# Patient Record
Sex: Female | Born: 1955 | Race: Black or African American | Hispanic: No | Smoking: Former smoker
Health system: Southern US, Community
[De-identification: ages and names within clinical notes are randomized; demographics above are authoritative.]

## PROBLEM LIST (undated history)

## (undated) ENCOUNTER — Emergency Department (HOSPITAL_COMMUNITY): Admission: EM | Payer: Self-pay | Source: Home / Self Care

## (undated) DIAGNOSIS — K529 Noninfective gastroenteritis and colitis, unspecified: Secondary | ICD-10-CM

## (undated) DIAGNOSIS — M5030 Other cervical disc degeneration, unspecified cervical region: Secondary | ICD-10-CM

## (undated) DIAGNOSIS — G4730 Sleep apnea, unspecified: Secondary | ICD-10-CM

## (undated) DIAGNOSIS — M542 Cervicalgia: Secondary | ICD-10-CM

## (undated) DIAGNOSIS — E7800 Pure hypercholesterolemia, unspecified: Secondary | ICD-10-CM

## (undated) DIAGNOSIS — I739 Peripheral vascular disease, unspecified: Secondary | ICD-10-CM

## (undated) DIAGNOSIS — K2970 Gastritis, unspecified, without bleeding: Secondary | ICD-10-CM

## (undated) DIAGNOSIS — T1491XA Suicide attempt, initial encounter: Secondary | ICD-10-CM

## (undated) DIAGNOSIS — K8020 Calculus of gallbladder without cholecystitis without obstruction: Secondary | ICD-10-CM

## (undated) DIAGNOSIS — R519 Headache, unspecified: Secondary | ICD-10-CM

## (undated) DIAGNOSIS — D649 Anemia, unspecified: Secondary | ICD-10-CM

## (undated) DIAGNOSIS — N83202 Unspecified ovarian cyst, left side: Secondary | ICD-10-CM

## (undated) DIAGNOSIS — I201 Angina pectoris with documented spasm: Secondary | ICD-10-CM

## (undated) DIAGNOSIS — I219 Acute myocardial infarction, unspecified: Secondary | ICD-10-CM

## (undated) DIAGNOSIS — Z9289 Personal history of other medical treatment: Secondary | ICD-10-CM

## (undated) DIAGNOSIS — K219 Gastro-esophageal reflux disease without esophagitis: Secondary | ICD-10-CM

## (undated) DIAGNOSIS — R062 Wheezing: Secondary | ICD-10-CM

## (undated) DIAGNOSIS — N83201 Unspecified ovarian cyst, right side: Secondary | ICD-10-CM

## (undated) DIAGNOSIS — H269 Unspecified cataract: Secondary | ICD-10-CM

## (undated) DIAGNOSIS — I82409 Acute embolism and thrombosis of unspecified deep veins of unspecified lower extremity: Secondary | ICD-10-CM

## (undated) DIAGNOSIS — E119 Type 2 diabetes mellitus without complications: Secondary | ICD-10-CM

## (undated) DIAGNOSIS — M479 Spondylosis, unspecified: Secondary | ICD-10-CM

## (undated) DIAGNOSIS — N281 Cyst of kidney, acquired: Secondary | ICD-10-CM

## (undated) DIAGNOSIS — I509 Heart failure, unspecified: Secondary | ICD-10-CM

## (undated) DIAGNOSIS — T7840XA Allergy, unspecified, initial encounter: Secondary | ICD-10-CM

## (undated) DIAGNOSIS — K224 Dyskinesia of esophagus: Secondary | ICD-10-CM

## (undated) DIAGNOSIS — M4800 Spinal stenosis, site unspecified: Secondary | ICD-10-CM

## (undated) DIAGNOSIS — R112 Nausea with vomiting, unspecified: Secondary | ICD-10-CM

## (undated) DIAGNOSIS — F259 Schizoaffective disorder, unspecified: Secondary | ICD-10-CM

## (undated) DIAGNOSIS — I2510 Atherosclerotic heart disease of native coronary artery without angina pectoris: Secondary | ICD-10-CM

## (undated) DIAGNOSIS — M1990 Unspecified osteoarthritis, unspecified site: Secondary | ICD-10-CM

## (undated) DIAGNOSIS — I10 Essential (primary) hypertension: Secondary | ICD-10-CM

## (undated) DIAGNOSIS — Z7901 Long term (current) use of anticoagulants: Secondary | ICD-10-CM

## (undated) DIAGNOSIS — F32A Depression, unspecified: Secondary | ICD-10-CM

## (undated) DIAGNOSIS — M25512 Pain in left shoulder: Secondary | ICD-10-CM

## (undated) DIAGNOSIS — M48061 Spinal stenosis, lumbar region without neurogenic claudication: Secondary | ICD-10-CM

## (undated) DIAGNOSIS — I2699 Other pulmonary embolism without acute cor pulmonale: Secondary | ICD-10-CM

## (undated) DIAGNOSIS — R0683 Snoring: Secondary | ICD-10-CM

## (undated) DIAGNOSIS — J90 Pleural effusion, not elsewhere classified: Secondary | ICD-10-CM

## (undated) DIAGNOSIS — Z9889 Other specified postprocedural states: Secondary | ICD-10-CM

## (undated) DIAGNOSIS — I209 Angina pectoris, unspecified: Secondary | ICD-10-CM

## (undated) DIAGNOSIS — D689 Coagulation defect, unspecified: Secondary | ICD-10-CM

## (undated) DIAGNOSIS — G8929 Other chronic pain: Secondary | ICD-10-CM

## (undated) DIAGNOSIS — F4310 Post-traumatic stress disorder, unspecified: Secondary | ICD-10-CM

## (undated) DIAGNOSIS — Z86711 Personal history of pulmonary embolism: Secondary | ICD-10-CM

## (undated) DIAGNOSIS — J189 Pneumonia, unspecified organism: Secondary | ICD-10-CM

## (undated) DIAGNOSIS — J449 Chronic obstructive pulmonary disease, unspecified: Secondary | ICD-10-CM

## (undated) DIAGNOSIS — I519 Heart disease, unspecified: Secondary | ICD-10-CM

## (undated) DIAGNOSIS — K034 Hypercementosis: Secondary | ICD-10-CM

## (undated) DIAGNOSIS — R51 Headache: Secondary | ICD-10-CM

## (undated) DIAGNOSIS — F251 Schizoaffective disorder, depressive type: Secondary | ICD-10-CM

## (undated) DIAGNOSIS — E039 Hypothyroidism, unspecified: Secondary | ICD-10-CM

## (undated) DIAGNOSIS — M549 Dorsalgia, unspecified: Secondary | ICD-10-CM

## (undated) DIAGNOSIS — G43909 Migraine, unspecified, not intractable, without status migrainosus: Secondary | ICD-10-CM

## (undated) DIAGNOSIS — M5441 Lumbago with sciatica, right side: Secondary | ICD-10-CM

## (undated) DIAGNOSIS — R079 Chest pain, unspecified: Secondary | ICD-10-CM

## (undated) DIAGNOSIS — K589 Irritable bowel syndrome without diarrhea: Secondary | ICD-10-CM

## (undated) DIAGNOSIS — E785 Hyperlipidemia, unspecified: Secondary | ICD-10-CM

## (undated) DIAGNOSIS — R0602 Shortness of breath: Secondary | ICD-10-CM

## (undated) DIAGNOSIS — F329 Major depressive disorder, single episode, unspecified: Secondary | ICD-10-CM

## (undated) HISTORY — DX: Chronic obstructive pulmonary disease, unspecified: J44.9

## (undated) HISTORY — DX: Other chronic pain: G89.29

## (undated) HISTORY — DX: Essential (primary) hypertension: I10

## (undated) HISTORY — DX: Calculus of gallbladder without cholecystitis without obstruction: K80.20

## (undated) HISTORY — DX: Cyst of kidney, acquired: N28.1

## (undated) HISTORY — PX: BREAST EXCISIONAL BIOPSY: SUR124

## (undated) HISTORY — DX: Angina pectoris with documented spasm: I20.1

## (undated) HISTORY — DX: Pleural effusion, not elsewhere classified: J90

## (undated) HISTORY — DX: Coagulation defect, unspecified: D68.9

## (undated) HISTORY — DX: Heart disease, unspecified: I51.9

## (undated) HISTORY — DX: Personal history of other medical treatment: Z92.89

## (undated) HISTORY — DX: Personal history of pulmonary embolism: Z86.711

## (undated) HISTORY — PX: IVC FILTER REMOVAL: CATH118246

## (undated) HISTORY — DX: Hypercementosis: K03.4

## (undated) HISTORY — DX: Unspecified cataract: H26.9

## (undated) HISTORY — DX: Noninfective gastroenteritis and colitis, unspecified: K52.9

## (undated) HISTORY — DX: Chest pain, unspecified: R07.9

## (undated) HISTORY — DX: Allergy, unspecified, initial encounter: T78.40XA

## (undated) HISTORY — DX: Hyperlipidemia, unspecified: E78.5

## (undated) HISTORY — PX: TONSILLECTOMY AND ADENOIDECTOMY: SUR1326

## (undated) HISTORY — DX: Snoring: R06.83

## (undated) HISTORY — DX: Schizoaffective disorder, depressive type: F25.1

## (undated) HISTORY — DX: Cervicalgia: M54.2

## (undated) HISTORY — DX: Lumbago with sciatica, right side: M54.41

## (undated) HISTORY — PX: JOINT REPLACEMENT: SHX530

## (undated) HISTORY — PX: BREAST CYST EXCISION: SHX579

## (undated) HISTORY — DX: Shortness of breath: R06.02

## (undated) HISTORY — DX: Pain in left shoulder: M25.512

## (undated) HISTORY — DX: Suicide attempt, initial encounter: T14.91XA

## (undated) HISTORY — PX: BACK SURGERY: SHX140

## (undated) HISTORY — PX: CARDIAC CATHETERIZATION: SHX172

## (undated) HISTORY — PX: ABDOMINAL HYSTERECTOMY: SHX81

## (undated) HISTORY — DX: Spinal stenosis, lumbar region without neurogenic claudication: M48.061

## (undated) HISTORY — DX: Wheezing: R06.2

## (undated) HISTORY — DX: Atherosclerotic heart disease of native coronary artery without angina pectoris: I25.10

---

## 1983-09-02 DIAGNOSIS — Z9289 Personal history of other medical treatment: Secondary | ICD-10-CM

## 1983-09-02 HISTORY — PX: PARTIAL HYSTERECTOMY: SHX80

## 1983-09-02 HISTORY — PX: ABDOMINAL HYSTERECTOMY: SHX81

## 1983-09-02 HISTORY — PX: HERNIA REPAIR: SHX51

## 1983-09-02 HISTORY — DX: Personal history of other medical treatment: Z92.89

## 1983-09-02 HISTORY — PX: APPENDECTOMY: SHX54

## 1983-09-02 HISTORY — PX: UMBILICAL HERNIA REPAIR: SHX196

## 1984-12-30 HISTORY — PX: EXCISION/RELEASE BURSA HIP: SHX5014

## 2000-09-01 HISTORY — PX: CHOLECYSTECTOMY: SHX55

## 2011-09-03 DIAGNOSIS — G4733 Obstructive sleep apnea (adult) (pediatric): Secondary | ICD-10-CM | POA: Diagnosis not present

## 2011-09-03 DIAGNOSIS — G589 Mononeuropathy, unspecified: Secondary | ICD-10-CM | POA: Diagnosis not present

## 2011-09-03 DIAGNOSIS — L819 Disorder of pigmentation, unspecified: Secondary | ICD-10-CM | POA: Diagnosis not present

## 2011-09-03 DIAGNOSIS — E559 Vitamin D deficiency, unspecified: Secondary | ICD-10-CM | POA: Diagnosis not present

## 2011-09-03 DIAGNOSIS — Z86718 Personal history of other venous thrombosis and embolism: Secondary | ICD-10-CM | POA: Diagnosis not present

## 2011-09-03 DIAGNOSIS — E039 Hypothyroidism, unspecified: Secondary | ICD-10-CM | POA: Diagnosis not present

## 2011-09-03 DIAGNOSIS — K589 Irritable bowel syndrome without diarrhea: Secondary | ICD-10-CM | POA: Diagnosis not present

## 2011-09-03 DIAGNOSIS — Z79899 Other long term (current) drug therapy: Secondary | ICD-10-CM | POA: Diagnosis not present

## 2011-09-03 DIAGNOSIS — F3289 Other specified depressive episodes: Secondary | ICD-10-CM | POA: Diagnosis not present

## 2011-09-03 DIAGNOSIS — E876 Hypokalemia: Secondary | ICD-10-CM | POA: Diagnosis not present

## 2011-09-03 DIAGNOSIS — D649 Anemia, unspecified: Secondary | ICD-10-CM | POA: Diagnosis not present

## 2011-09-03 DIAGNOSIS — F411 Generalized anxiety disorder: Secondary | ICD-10-CM | POA: Diagnosis not present

## 2011-09-03 DIAGNOSIS — M129 Arthropathy, unspecified: Secondary | ICD-10-CM | POA: Diagnosis not present

## 2011-09-03 DIAGNOSIS — Z801 Family history of malignant neoplasm of trachea, bronchus and lung: Secondary | ICD-10-CM | POA: Diagnosis not present

## 2011-09-03 DIAGNOSIS — F329 Major depressive disorder, single episode, unspecified: Secondary | ICD-10-CM | POA: Diagnosis not present

## 2011-09-03 DIAGNOSIS — D689 Coagulation defect, unspecified: Secondary | ICD-10-CM | POA: Diagnosis not present

## 2011-09-03 DIAGNOSIS — K449 Diaphragmatic hernia without obstruction or gangrene: Secondary | ICD-10-CM | POA: Diagnosis not present

## 2011-09-03 DIAGNOSIS — E119 Type 2 diabetes mellitus without complications: Secondary | ICD-10-CM | POA: Diagnosis not present

## 2011-09-03 DIAGNOSIS — D6859 Other primary thrombophilia: Secondary | ICD-10-CM | POA: Diagnosis not present

## 2011-09-03 DIAGNOSIS — I10 Essential (primary) hypertension: Secondary | ICD-10-CM | POA: Diagnosis not present

## 2011-09-03 DIAGNOSIS — M4800 Spinal stenosis, site unspecified: Secondary | ICD-10-CM | POA: Diagnosis not present

## 2011-09-03 DIAGNOSIS — Z86711 Personal history of pulmonary embolism: Secondary | ICD-10-CM | POA: Diagnosis not present

## 2011-09-03 DIAGNOSIS — K2100 Gastro-esophageal reflux disease with esophagitis, without bleeding: Secondary | ICD-10-CM | POA: Diagnosis not present

## 2011-09-03 DIAGNOSIS — R45851 Suicidal ideations: Secondary | ICD-10-CM | POA: Diagnosis not present

## 2011-09-03 DIAGNOSIS — G8929 Other chronic pain: Secondary | ICD-10-CM | POA: Diagnosis not present

## 2011-09-03 DIAGNOSIS — I2510 Atherosclerotic heart disease of native coronary artery without angina pectoris: Secondary | ICD-10-CM | POA: Diagnosis not present

## 2011-09-03 DIAGNOSIS — E785 Hyperlipidemia, unspecified: Secondary | ICD-10-CM | POA: Diagnosis not present

## 2011-09-10 DIAGNOSIS — E119 Type 2 diabetes mellitus without complications: Secondary | ICD-10-CM | POA: Diagnosis not present

## 2011-09-10 DIAGNOSIS — D649 Anemia, unspecified: Secondary | ICD-10-CM | POA: Diagnosis not present

## 2011-09-10 DIAGNOSIS — L819 Disorder of pigmentation, unspecified: Secondary | ICD-10-CM | POA: Diagnosis not present

## 2011-09-10 DIAGNOSIS — E785 Hyperlipidemia, unspecified: Secondary | ICD-10-CM | POA: Diagnosis not present

## 2011-09-10 DIAGNOSIS — K2100 Gastro-esophageal reflux disease with esophagitis, without bleeding: Secondary | ICD-10-CM | POA: Diagnosis not present

## 2011-09-10 DIAGNOSIS — D6859 Other primary thrombophilia: Secondary | ICD-10-CM | POA: Diagnosis not present

## 2011-09-11 DIAGNOSIS — D649 Anemia, unspecified: Secondary | ICD-10-CM | POA: Diagnosis not present

## 2011-09-11 DIAGNOSIS — E785 Hyperlipidemia, unspecified: Secondary | ICD-10-CM | POA: Diagnosis not present

## 2011-09-11 DIAGNOSIS — D6859 Other primary thrombophilia: Secondary | ICD-10-CM | POA: Diagnosis not present

## 2011-09-11 DIAGNOSIS — L819 Disorder of pigmentation, unspecified: Secondary | ICD-10-CM | POA: Diagnosis not present

## 2011-09-11 DIAGNOSIS — E119 Type 2 diabetes mellitus without complications: Secondary | ICD-10-CM | POA: Diagnosis not present

## 2011-09-11 DIAGNOSIS — K2100 Gastro-esophageal reflux disease with esophagitis, without bleeding: Secondary | ICD-10-CM | POA: Diagnosis not present

## 2011-09-12 DIAGNOSIS — Z7901 Long term (current) use of anticoagulants: Secondary | ICD-10-CM | POA: Diagnosis not present

## 2011-09-12 DIAGNOSIS — Z87891 Personal history of nicotine dependence: Secondary | ICD-10-CM | POA: Diagnosis not present

## 2011-09-12 DIAGNOSIS — R1032 Left lower quadrant pain: Secondary | ICD-10-CM | POA: Diagnosis not present

## 2011-09-12 DIAGNOSIS — IMO0002 Reserved for concepts with insufficient information to code with codable children: Secondary | ICD-10-CM | POA: Diagnosis not present

## 2011-09-12 DIAGNOSIS — K5900 Constipation, unspecified: Secondary | ICD-10-CM | POA: Diagnosis not present

## 2011-09-12 DIAGNOSIS — S3981XA Other specified injuries of abdomen, initial encounter: Secondary | ICD-10-CM | POA: Diagnosis not present

## 2011-09-12 DIAGNOSIS — R1031 Right lower quadrant pain: Secondary | ICD-10-CM | POA: Diagnosis not present

## 2011-09-12 DIAGNOSIS — R109 Unspecified abdominal pain: Secondary | ICD-10-CM | POA: Diagnosis not present

## 2011-09-16 DIAGNOSIS — D6859 Other primary thrombophilia: Secondary | ICD-10-CM | POA: Diagnosis not present

## 2011-09-16 DIAGNOSIS — E119 Type 2 diabetes mellitus without complications: Secondary | ICD-10-CM | POA: Diagnosis not present

## 2011-09-16 DIAGNOSIS — L819 Disorder of pigmentation, unspecified: Secondary | ICD-10-CM | POA: Diagnosis not present

## 2011-09-16 DIAGNOSIS — K2100 Gastro-esophageal reflux disease with esophagitis, without bleeding: Secondary | ICD-10-CM | POA: Diagnosis not present

## 2011-09-16 DIAGNOSIS — D485 Neoplasm of uncertain behavior of skin: Secondary | ICD-10-CM | POA: Diagnosis not present

## 2011-09-16 DIAGNOSIS — E785 Hyperlipidemia, unspecified: Secondary | ICD-10-CM | POA: Diagnosis not present

## 2011-09-16 DIAGNOSIS — D649 Anemia, unspecified: Secondary | ICD-10-CM | POA: Diagnosis not present

## 2011-09-17 DIAGNOSIS — E119 Type 2 diabetes mellitus without complications: Secondary | ICD-10-CM | POA: Diagnosis not present

## 2011-09-17 DIAGNOSIS — E039 Hypothyroidism, unspecified: Secondary | ICD-10-CM | POA: Diagnosis not present

## 2011-09-17 DIAGNOSIS — I2510 Atherosclerotic heart disease of native coronary artery without angina pectoris: Secondary | ICD-10-CM | POA: Diagnosis not present

## 2011-09-17 DIAGNOSIS — D649 Anemia, unspecified: Secondary | ICD-10-CM | POA: Diagnosis not present

## 2011-09-24 DIAGNOSIS — H539 Unspecified visual disturbance: Secondary | ICD-10-CM | POA: Diagnosis not present

## 2011-09-24 DIAGNOSIS — H2510 Age-related nuclear cataract, unspecified eye: Secondary | ICD-10-CM | POA: Diagnosis not present

## 2011-09-24 DIAGNOSIS — E119 Type 2 diabetes mellitus without complications: Secondary | ICD-10-CM | POA: Diagnosis not present

## 2011-09-24 DIAGNOSIS — H5319 Other subjective visual disturbances: Secondary | ICD-10-CM | POA: Diagnosis not present

## 2011-09-24 DIAGNOSIS — R51 Headache: Secondary | ICD-10-CM | POA: Diagnosis not present

## 2011-09-24 DIAGNOSIS — H43399 Other vitreous opacities, unspecified eye: Secondary | ICD-10-CM | POA: Diagnosis not present

## 2011-10-01 DIAGNOSIS — M25539 Pain in unspecified wrist: Secondary | ICD-10-CM | POA: Diagnosis not present

## 2011-10-01 DIAGNOSIS — M19049 Primary osteoarthritis, unspecified hand: Secondary | ICD-10-CM | POA: Diagnosis not present

## 2011-10-01 DIAGNOSIS — S59919A Unspecified injury of unspecified forearm, initial encounter: Secondary | ICD-10-CM | POA: Diagnosis not present

## 2011-10-01 DIAGNOSIS — S59909A Unspecified injury of unspecified elbow, initial encounter: Secondary | ICD-10-CM | POA: Diagnosis not present

## 2011-10-07 DIAGNOSIS — F259 Schizoaffective disorder, unspecified: Secondary | ICD-10-CM | POA: Diagnosis not present

## 2011-10-07 DIAGNOSIS — S6000XA Contusion of unspecified finger without damage to nail, initial encounter: Secondary | ICD-10-CM | POA: Diagnosis not present

## 2011-10-07 DIAGNOSIS — D6859 Other primary thrombophilia: Secondary | ICD-10-CM | POA: Diagnosis not present

## 2011-10-07 DIAGNOSIS — I4891 Unspecified atrial fibrillation: Secondary | ICD-10-CM | POA: Diagnosis not present

## 2011-10-07 DIAGNOSIS — S6980XA Other specified injuries of unspecified wrist, hand and finger(s), initial encounter: Secondary | ICD-10-CM | POA: Diagnosis not present

## 2011-10-07 DIAGNOSIS — M7989 Other specified soft tissue disorders: Secondary | ICD-10-CM | POA: Diagnosis not present

## 2011-10-07 DIAGNOSIS — I10 Essential (primary) hypertension: Secondary | ICD-10-CM | POA: Diagnosis not present

## 2011-10-07 DIAGNOSIS — M79609 Pain in unspecified limb: Secondary | ICD-10-CM | POA: Diagnosis not present

## 2011-10-07 DIAGNOSIS — I82409 Acute embolism and thrombosis of unspecified deep veins of unspecified lower extremity: Secondary | ICD-10-CM | POA: Diagnosis not present

## 2011-10-07 DIAGNOSIS — Z87891 Personal history of nicotine dependence: Secondary | ICD-10-CM | POA: Diagnosis not present

## 2011-10-07 DIAGNOSIS — E119 Type 2 diabetes mellitus without complications: Secondary | ICD-10-CM | POA: Diagnosis not present

## 2011-10-07 DIAGNOSIS — E079 Disorder of thyroid, unspecified: Secondary | ICD-10-CM | POA: Diagnosis not present

## 2011-10-07 DIAGNOSIS — K219 Gastro-esophageal reflux disease without esophagitis: Secondary | ICD-10-CM | POA: Diagnosis not present

## 2011-10-07 DIAGNOSIS — Z7901 Long term (current) use of anticoagulants: Secondary | ICD-10-CM | POA: Diagnosis not present

## 2011-10-07 DIAGNOSIS — S6990XA Unspecified injury of unspecified wrist, hand and finger(s), initial encounter: Secondary | ICD-10-CM | POA: Diagnosis not present

## 2011-10-07 DIAGNOSIS — I2782 Chronic pulmonary embolism: Secondary | ICD-10-CM | POA: Diagnosis not present

## 2011-10-07 DIAGNOSIS — D689 Coagulation defect, unspecified: Secondary | ICD-10-CM | POA: Diagnosis not present

## 2011-10-14 DIAGNOSIS — M779 Enthesopathy, unspecified: Secondary | ICD-10-CM | POA: Diagnosis not present

## 2011-10-18 DIAGNOSIS — R51 Headache: Secondary | ICD-10-CM | POA: Diagnosis not present

## 2011-10-18 DIAGNOSIS — I10 Essential (primary) hypertension: Secondary | ICD-10-CM | POA: Diagnosis not present

## 2011-10-18 DIAGNOSIS — F209 Schizophrenia, unspecified: Secondary | ICD-10-CM | POA: Diagnosis not present

## 2011-10-18 DIAGNOSIS — E119 Type 2 diabetes mellitus without complications: Secondary | ICD-10-CM | POA: Diagnosis not present

## 2011-10-18 DIAGNOSIS — E7800 Pure hypercholesterolemia, unspecified: Secondary | ICD-10-CM | POA: Diagnosis not present

## 2011-10-18 DIAGNOSIS — E079 Disorder of thyroid, unspecified: Secondary | ICD-10-CM | POA: Diagnosis not present

## 2011-10-18 DIAGNOSIS — M25519 Pain in unspecified shoulder: Secondary | ICD-10-CM | POA: Diagnosis not present

## 2011-10-18 DIAGNOSIS — Z87891 Personal history of nicotine dependence: Secondary | ICD-10-CM | POA: Diagnosis not present

## 2011-10-18 DIAGNOSIS — I4891 Unspecified atrial fibrillation: Secondary | ICD-10-CM | POA: Diagnosis not present

## 2011-10-18 DIAGNOSIS — Z9079 Acquired absence of other genital organ(s): Secondary | ICD-10-CM | POA: Diagnosis not present

## 2011-10-18 DIAGNOSIS — R509 Fever, unspecified: Secondary | ICD-10-CM | POA: Diagnosis not present

## 2011-10-18 DIAGNOSIS — K219 Gastro-esophageal reflux disease without esophagitis: Secondary | ICD-10-CM | POA: Diagnosis not present

## 2011-10-18 DIAGNOSIS — IMO0001 Reserved for inherently not codable concepts without codable children: Secondary | ICD-10-CM | POA: Diagnosis not present

## 2011-10-18 DIAGNOSIS — R791 Abnormal coagulation profile: Secondary | ICD-10-CM | POA: Diagnosis not present

## 2011-10-22 DIAGNOSIS — I82409 Acute embolism and thrombosis of unspecified deep veins of unspecified lower extremity: Secondary | ICD-10-CM | POA: Diagnosis not present

## 2011-10-22 DIAGNOSIS — Z7901 Long term (current) use of anticoagulants: Secondary | ICD-10-CM | POA: Diagnosis not present

## 2011-10-22 DIAGNOSIS — D689 Coagulation defect, unspecified: Secondary | ICD-10-CM | POA: Diagnosis not present

## 2011-10-22 DIAGNOSIS — I2782 Chronic pulmonary embolism: Secondary | ICD-10-CM | POA: Diagnosis not present

## 2011-10-22 DIAGNOSIS — D6859 Other primary thrombophilia: Secondary | ICD-10-CM | POA: Diagnosis not present

## 2011-10-27 DIAGNOSIS — M5126 Other intervertebral disc displacement, lumbar region: Secondary | ICD-10-CM | POA: Diagnosis not present

## 2011-10-27 DIAGNOSIS — R209 Unspecified disturbances of skin sensation: Secondary | ICD-10-CM | POA: Diagnosis not present

## 2011-10-27 DIAGNOSIS — M47817 Spondylosis without myelopathy or radiculopathy, lumbosacral region: Secondary | ICD-10-CM | POA: Diagnosis not present

## 2011-10-27 DIAGNOSIS — M129 Arthropathy, unspecified: Secondary | ICD-10-CM | POA: Diagnosis not present

## 2011-10-27 DIAGNOSIS — M47812 Spondylosis without myelopathy or radiculopathy, cervical region: Secondary | ICD-10-CM | POA: Diagnosis not present

## 2011-10-31 DIAGNOSIS — M545 Low back pain, unspecified: Secondary | ICD-10-CM | POA: Diagnosis not present

## 2011-10-31 DIAGNOSIS — M4802 Spinal stenosis, cervical region: Secondary | ICD-10-CM | POA: Diagnosis not present

## 2011-11-06 DIAGNOSIS — I2782 Chronic pulmonary embolism: Secondary | ICD-10-CM | POA: Diagnosis not present

## 2011-11-06 DIAGNOSIS — Z7901 Long term (current) use of anticoagulants: Secondary | ICD-10-CM | POA: Diagnosis not present

## 2011-11-06 DIAGNOSIS — I82409 Acute embolism and thrombosis of unspecified deep veins of unspecified lower extremity: Secondary | ICD-10-CM | POA: Diagnosis not present

## 2011-11-06 DIAGNOSIS — D689 Coagulation defect, unspecified: Secondary | ICD-10-CM | POA: Diagnosis not present

## 2011-11-06 DIAGNOSIS — D6859 Other primary thrombophilia: Secondary | ICD-10-CM | POA: Diagnosis not present

## 2011-11-17 DIAGNOSIS — E119 Type 2 diabetes mellitus without complications: Secondary | ICD-10-CM | POA: Diagnosis not present

## 2011-11-17 DIAGNOSIS — M79609 Pain in unspecified limb: Secondary | ICD-10-CM | POA: Diagnosis not present

## 2011-11-17 DIAGNOSIS — F259 Schizoaffective disorder, unspecified: Secondary | ICD-10-CM | POA: Diagnosis not present

## 2011-11-17 DIAGNOSIS — M549 Dorsalgia, unspecified: Secondary | ICD-10-CM | POA: Diagnosis not present

## 2011-11-17 DIAGNOSIS — M545 Low back pain, unspecified: Secondary | ICD-10-CM | POA: Diagnosis not present

## 2011-11-17 DIAGNOSIS — M5137 Other intervertebral disc degeneration, lumbosacral region: Secondary | ICD-10-CM | POA: Diagnosis not present

## 2011-11-17 DIAGNOSIS — I10 Essential (primary) hypertension: Secondary | ICD-10-CM | POA: Diagnosis not present

## 2011-11-17 DIAGNOSIS — Z90710 Acquired absence of both cervix and uterus: Secondary | ICD-10-CM | POA: Diagnosis not present

## 2011-11-17 DIAGNOSIS — Z7901 Long term (current) use of anticoagulants: Secondary | ICD-10-CM | POA: Diagnosis not present

## 2011-11-17 DIAGNOSIS — E039 Hypothyroidism, unspecified: Secondary | ICD-10-CM | POA: Diagnosis not present

## 2011-11-17 DIAGNOSIS — K219 Gastro-esophageal reflux disease without esophagitis: Secondary | ICD-10-CM | POA: Diagnosis not present

## 2011-11-17 DIAGNOSIS — E7800 Pure hypercholesterolemia, unspecified: Secondary | ICD-10-CM | POA: Diagnosis not present

## 2011-11-17 DIAGNOSIS — G8929 Other chronic pain: Secondary | ICD-10-CM | POA: Diagnosis not present

## 2011-11-17 DIAGNOSIS — M542 Cervicalgia: Secondary | ICD-10-CM | POA: Diagnosis not present

## 2011-11-18 DIAGNOSIS — M542 Cervicalgia: Secondary | ICD-10-CM | POA: Diagnosis not present

## 2011-11-18 DIAGNOSIS — M48061 Spinal stenosis, lumbar region without neurogenic claudication: Secondary | ICD-10-CM | POA: Diagnosis not present

## 2011-11-18 DIAGNOSIS — M545 Low back pain, unspecified: Secondary | ICD-10-CM | POA: Diagnosis not present

## 2011-11-18 DIAGNOSIS — M4802 Spinal stenosis, cervical region: Secondary | ICD-10-CM | POA: Diagnosis not present

## 2011-11-26 DIAGNOSIS — E7800 Pure hypercholesterolemia, unspecified: Secondary | ICD-10-CM | POA: Diagnosis not present

## 2011-11-26 DIAGNOSIS — IMO0002 Reserved for concepts with insufficient information to code with codable children: Secondary | ICD-10-CM | POA: Diagnosis not present

## 2011-11-26 DIAGNOSIS — E119 Type 2 diabetes mellitus without complications: Secondary | ICD-10-CM | POA: Diagnosis not present

## 2011-11-26 DIAGNOSIS — M4800 Spinal stenosis, site unspecified: Secondary | ICD-10-CM | POA: Diagnosis not present

## 2011-11-26 DIAGNOSIS — K5900 Constipation, unspecified: Secondary | ICD-10-CM | POA: Diagnosis not present

## 2011-11-26 DIAGNOSIS — I4891 Unspecified atrial fibrillation: Secondary | ICD-10-CM | POA: Diagnosis not present

## 2011-11-26 DIAGNOSIS — D649 Anemia, unspecified: Secondary | ICD-10-CM | POA: Diagnosis not present

## 2011-11-26 DIAGNOSIS — F259 Schizoaffective disorder, unspecified: Secondary | ICD-10-CM | POA: Diagnosis not present

## 2011-11-26 DIAGNOSIS — I10 Essential (primary) hypertension: Secondary | ICD-10-CM | POA: Diagnosis not present

## 2011-11-26 DIAGNOSIS — K219 Gastro-esophageal reflux disease without esophagitis: Secondary | ICD-10-CM | POA: Diagnosis not present

## 2011-11-26 DIAGNOSIS — E039 Hypothyroidism, unspecified: Secondary | ICD-10-CM | POA: Diagnosis not present

## 2011-11-26 DIAGNOSIS — R109 Unspecified abdominal pain: Secondary | ICD-10-CM | POA: Diagnosis not present

## 2011-11-29 DIAGNOSIS — I4891 Unspecified atrial fibrillation: Secondary | ICD-10-CM | POA: Diagnosis not present

## 2011-11-29 DIAGNOSIS — E7800 Pure hypercholesterolemia, unspecified: Secondary | ICD-10-CM | POA: Diagnosis not present

## 2011-11-29 DIAGNOSIS — F259 Schizoaffective disorder, unspecified: Secondary | ICD-10-CM | POA: Diagnosis not present

## 2011-11-29 DIAGNOSIS — K219 Gastro-esophageal reflux disease without esophagitis: Secondary | ICD-10-CM | POA: Diagnosis not present

## 2011-11-29 DIAGNOSIS — I10 Essential (primary) hypertension: Secondary | ICD-10-CM | POA: Diagnosis not present

## 2011-11-29 DIAGNOSIS — E119 Type 2 diabetes mellitus without complications: Secondary | ICD-10-CM | POA: Diagnosis not present

## 2011-11-29 DIAGNOSIS — H538 Other visual disturbances: Secondary | ICD-10-CM | POA: Diagnosis not present

## 2011-11-29 DIAGNOSIS — H43399 Other vitreous opacities, unspecified eye: Secondary | ICD-10-CM | POA: Diagnosis not present

## 2011-11-29 DIAGNOSIS — R51 Headache: Secondary | ICD-10-CM | POA: Diagnosis not present

## 2011-11-29 DIAGNOSIS — E039 Hypothyroidism, unspecified: Secondary | ICD-10-CM | POA: Diagnosis not present

## 2011-11-29 DIAGNOSIS — Z7901 Long term (current) use of anticoagulants: Secondary | ICD-10-CM | POA: Diagnosis not present

## 2011-12-02 DIAGNOSIS — H43819 Vitreous degeneration, unspecified eye: Secondary | ICD-10-CM | POA: Diagnosis not present

## 2011-12-02 DIAGNOSIS — G43809 Other migraine, not intractable, without status migrainosus: Secondary | ICD-10-CM | POA: Diagnosis not present

## 2011-12-04 DIAGNOSIS — Z7901 Long term (current) use of anticoagulants: Secondary | ICD-10-CM | POA: Diagnosis not present

## 2011-12-04 DIAGNOSIS — I82409 Acute embolism and thrombosis of unspecified deep veins of unspecified lower extremity: Secondary | ICD-10-CM | POA: Diagnosis not present

## 2011-12-04 DIAGNOSIS — D6859 Other primary thrombophilia: Secondary | ICD-10-CM | POA: Diagnosis not present

## 2011-12-04 DIAGNOSIS — I2782 Chronic pulmonary embolism: Secondary | ICD-10-CM | POA: Diagnosis not present

## 2011-12-04 DIAGNOSIS — D689 Coagulation defect, unspecified: Secondary | ICD-10-CM | POA: Diagnosis not present

## 2011-12-06 DIAGNOSIS — E039 Hypothyroidism, unspecified: Secondary | ICD-10-CM | POA: Diagnosis not present

## 2011-12-06 DIAGNOSIS — F259 Schizoaffective disorder, unspecified: Secondary | ICD-10-CM | POA: Diagnosis not present

## 2011-12-06 DIAGNOSIS — M4800 Spinal stenosis, site unspecified: Secondary | ICD-10-CM | POA: Diagnosis not present

## 2011-12-06 DIAGNOSIS — M25559 Pain in unspecified hip: Secondary | ICD-10-CM | POA: Diagnosis not present

## 2011-12-06 DIAGNOSIS — E7800 Pure hypercholesterolemia, unspecified: Secondary | ICD-10-CM | POA: Diagnosis not present

## 2011-12-06 DIAGNOSIS — M76899 Other specified enthesopathies of unspecified lower limb, excluding foot: Secondary | ICD-10-CM | POA: Diagnosis not present

## 2011-12-06 DIAGNOSIS — E119 Type 2 diabetes mellitus without complications: Secondary | ICD-10-CM | POA: Diagnosis not present

## 2011-12-06 DIAGNOSIS — K219 Gastro-esophageal reflux disease without esophagitis: Secondary | ICD-10-CM | POA: Diagnosis not present

## 2011-12-06 DIAGNOSIS — I4891 Unspecified atrial fibrillation: Secondary | ICD-10-CM | POA: Diagnosis not present

## 2011-12-06 DIAGNOSIS — I10 Essential (primary) hypertension: Secondary | ICD-10-CM | POA: Diagnosis not present

## 2011-12-09 DIAGNOSIS — R933 Abnormal findings on diagnostic imaging of other parts of digestive tract: Secondary | ICD-10-CM | POA: Diagnosis not present

## 2011-12-09 DIAGNOSIS — K2970 Gastritis, unspecified, without bleeding: Secondary | ICD-10-CM | POA: Diagnosis not present

## 2011-12-09 DIAGNOSIS — R1013 Epigastric pain: Secondary | ICD-10-CM | POA: Diagnosis not present

## 2011-12-09 DIAGNOSIS — K2990 Gastroduodenitis, unspecified, without bleeding: Secondary | ICD-10-CM | POA: Diagnosis not present

## 2011-12-09 DIAGNOSIS — K2940 Chronic atrophic gastritis without bleeding: Secondary | ICD-10-CM | POA: Diagnosis not present

## 2011-12-09 DIAGNOSIS — K319 Disease of stomach and duodenum, unspecified: Secondary | ICD-10-CM | POA: Diagnosis not present

## 2011-12-11 DIAGNOSIS — Z90710 Acquired absence of both cervix and uterus: Secondary | ICD-10-CM | POA: Diagnosis not present

## 2011-12-11 DIAGNOSIS — N949 Unspecified condition associated with female genital organs and menstrual cycle: Secondary | ICD-10-CM | POA: Diagnosis not present

## 2011-12-16 DIAGNOSIS — R51 Headache: Secondary | ICD-10-CM | POA: Diagnosis not present

## 2011-12-18 DIAGNOSIS — M47817 Spondylosis without myelopathy or radiculopathy, lumbosacral region: Secondary | ICD-10-CM | POA: Diagnosis not present

## 2011-12-18 DIAGNOSIS — M47812 Spondylosis without myelopathy or radiculopathy, cervical region: Secondary | ICD-10-CM | POA: Diagnosis not present

## 2011-12-18 DIAGNOSIS — M5137 Other intervertebral disc degeneration, lumbosacral region: Secondary | ICD-10-CM | POA: Diagnosis not present

## 2011-12-18 DIAGNOSIS — M5030 Other cervical disc degeneration, unspecified cervical region: Secondary | ICD-10-CM | POA: Diagnosis not present

## 2011-12-19 DIAGNOSIS — K219 Gastro-esophageal reflux disease without esophagitis: Secondary | ICD-10-CM | POA: Diagnosis not present

## 2011-12-19 DIAGNOSIS — E119 Type 2 diabetes mellitus without complications: Secondary | ICD-10-CM | POA: Diagnosis not present

## 2011-12-19 DIAGNOSIS — I10 Essential (primary) hypertension: Secondary | ICD-10-CM | POA: Diagnosis not present

## 2011-12-19 DIAGNOSIS — K589 Irritable bowel syndrome without diarrhea: Secondary | ICD-10-CM | POA: Diagnosis not present

## 2011-12-19 DIAGNOSIS — Z7901 Long term (current) use of anticoagulants: Secondary | ICD-10-CM | POA: Diagnosis not present

## 2011-12-19 DIAGNOSIS — D6859 Other primary thrombophilia: Secondary | ICD-10-CM | POA: Diagnosis not present

## 2011-12-19 DIAGNOSIS — E039 Hypothyroidism, unspecified: Secondary | ICD-10-CM | POA: Diagnosis not present

## 2011-12-19 DIAGNOSIS — D649 Anemia, unspecified: Secondary | ICD-10-CM | POA: Diagnosis not present

## 2011-12-19 DIAGNOSIS — Z86718 Personal history of other venous thrombosis and embolism: Secondary | ICD-10-CM | POA: Diagnosis not present

## 2011-12-19 DIAGNOSIS — E785 Hyperlipidemia, unspecified: Secondary | ICD-10-CM | POA: Diagnosis not present

## 2011-12-26 DIAGNOSIS — E785 Hyperlipidemia, unspecified: Secondary | ICD-10-CM | POA: Diagnosis not present

## 2011-12-26 DIAGNOSIS — E119 Type 2 diabetes mellitus without complications: Secondary | ICD-10-CM | POA: Diagnosis not present

## 2011-12-26 DIAGNOSIS — I10 Essential (primary) hypertension: Secondary | ICD-10-CM | POA: Diagnosis not present

## 2011-12-26 DIAGNOSIS — D649 Anemia, unspecified: Secondary | ICD-10-CM | POA: Diagnosis not present

## 2011-12-26 DIAGNOSIS — D6859 Other primary thrombophilia: Secondary | ICD-10-CM | POA: Diagnosis not present

## 2011-12-26 DIAGNOSIS — Z7901 Long term (current) use of anticoagulants: Secondary | ICD-10-CM | POA: Diagnosis not present

## 2011-12-29 DIAGNOSIS — F3289 Other specified depressive episodes: Secondary | ICD-10-CM | POA: Diagnosis not present

## 2011-12-29 DIAGNOSIS — Z7901 Long term (current) use of anticoagulants: Secondary | ICD-10-CM | POA: Diagnosis not present

## 2011-12-29 DIAGNOSIS — G4733 Obstructive sleep apnea (adult) (pediatric): Secondary | ICD-10-CM | POA: Diagnosis not present

## 2011-12-29 DIAGNOSIS — M79609 Pain in unspecified limb: Secondary | ICD-10-CM | POA: Diagnosis not present

## 2011-12-29 DIAGNOSIS — R079 Chest pain, unspecified: Secondary | ICD-10-CM | POA: Diagnosis not present

## 2011-12-29 DIAGNOSIS — Z880 Allergy status to penicillin: Secondary | ICD-10-CM | POA: Diagnosis not present

## 2011-12-29 DIAGNOSIS — R0602 Shortness of breath: Secondary | ICD-10-CM | POA: Diagnosis not present

## 2011-12-29 DIAGNOSIS — F329 Major depressive disorder, single episode, unspecified: Secondary | ICD-10-CM | POA: Diagnosis not present

## 2011-12-29 DIAGNOSIS — Z86711 Personal history of pulmonary embolism: Secondary | ICD-10-CM | POA: Diagnosis not present

## 2011-12-29 DIAGNOSIS — E785 Hyperlipidemia, unspecified: Secondary | ICD-10-CM | POA: Diagnosis not present

## 2011-12-29 DIAGNOSIS — R52 Pain, unspecified: Secondary | ICD-10-CM | POA: Diagnosis not present

## 2011-12-29 DIAGNOSIS — K589 Irritable bowel syndrome without diarrhea: Secondary | ICD-10-CM | POA: Diagnosis not present

## 2011-12-29 DIAGNOSIS — E119 Type 2 diabetes mellitus without complications: Secondary | ICD-10-CM | POA: Diagnosis not present

## 2011-12-29 DIAGNOSIS — M25539 Pain in unspecified wrist: Secondary | ICD-10-CM | POA: Diagnosis not present

## 2011-12-29 DIAGNOSIS — I10 Essential (primary) hypertension: Secondary | ICD-10-CM | POA: Diagnosis not present

## 2011-12-29 DIAGNOSIS — I519 Heart disease, unspecified: Secondary | ICD-10-CM | POA: Diagnosis not present

## 2011-12-29 DIAGNOSIS — F411 Generalized anxiety disorder: Secondary | ICD-10-CM | POA: Diagnosis not present

## 2011-12-29 DIAGNOSIS — M1990 Unspecified osteoarthritis, unspecified site: Secondary | ICD-10-CM | POA: Diagnosis not present

## 2011-12-29 DIAGNOSIS — F259 Schizoaffective disorder, unspecified: Secondary | ICD-10-CM | POA: Diagnosis not present

## 2011-12-29 DIAGNOSIS — E039 Hypothyroidism, unspecified: Secondary | ICD-10-CM | POA: Diagnosis not present

## 2012-01-05 DIAGNOSIS — D6859 Other primary thrombophilia: Secondary | ICD-10-CM | POA: Diagnosis not present

## 2012-01-05 DIAGNOSIS — D649 Anemia, unspecified: Secondary | ICD-10-CM | POA: Diagnosis not present

## 2012-01-13 DIAGNOSIS — E039 Hypothyroidism, unspecified: Secondary | ICD-10-CM | POA: Diagnosis not present

## 2012-01-13 DIAGNOSIS — I4891 Unspecified atrial fibrillation: Secondary | ICD-10-CM | POA: Diagnosis not present

## 2012-01-13 DIAGNOSIS — K432 Incisional hernia without obstruction or gangrene: Secondary | ICD-10-CM | POA: Diagnosis not present

## 2012-01-13 DIAGNOSIS — D6859 Other primary thrombophilia: Secondary | ICD-10-CM | POA: Diagnosis not present

## 2012-01-13 DIAGNOSIS — I82409 Acute embolism and thrombosis of unspecified deep veins of unspecified lower extremity: Secondary | ICD-10-CM | POA: Diagnosis not present

## 2012-01-13 DIAGNOSIS — D689 Coagulation defect, unspecified: Secondary | ICD-10-CM | POA: Diagnosis not present

## 2012-01-13 DIAGNOSIS — Z87891 Personal history of nicotine dependence: Secondary | ICD-10-CM | POA: Diagnosis not present

## 2012-01-13 DIAGNOSIS — I10 Essential (primary) hypertension: Secondary | ICD-10-CM | POA: Diagnosis not present

## 2012-01-13 DIAGNOSIS — Z9079 Acquired absence of other genital organ(s): Secondary | ICD-10-CM | POA: Diagnosis not present

## 2012-01-13 DIAGNOSIS — I2782 Chronic pulmonary embolism: Secondary | ICD-10-CM | POA: Diagnosis not present

## 2012-01-13 DIAGNOSIS — K219 Gastro-esophageal reflux disease without esophagitis: Secondary | ICD-10-CM | POA: Diagnosis not present

## 2012-01-13 DIAGNOSIS — M4800 Spinal stenosis, site unspecified: Secondary | ICD-10-CM | POA: Diagnosis not present

## 2012-01-13 DIAGNOSIS — Z5181 Encounter for therapeutic drug level monitoring: Secondary | ICD-10-CM | POA: Diagnosis not present

## 2012-01-13 DIAGNOSIS — Z7901 Long term (current) use of anticoagulants: Secondary | ICD-10-CM | POA: Diagnosis not present

## 2012-01-16 DIAGNOSIS — K432 Incisional hernia without obstruction or gangrene: Secondary | ICD-10-CM | POA: Diagnosis not present

## 2012-01-19 DIAGNOSIS — D6859 Other primary thrombophilia: Secondary | ICD-10-CM | POA: Diagnosis not present

## 2012-01-19 DIAGNOSIS — Z5181 Encounter for therapeutic drug level monitoring: Secondary | ICD-10-CM | POA: Diagnosis not present

## 2012-01-19 DIAGNOSIS — I82409 Acute embolism and thrombosis of unspecified deep veins of unspecified lower extremity: Secondary | ICD-10-CM | POA: Diagnosis not present

## 2012-01-19 DIAGNOSIS — R1031 Right lower quadrant pain: Secondary | ICD-10-CM | POA: Diagnosis not present

## 2012-01-19 DIAGNOSIS — D689 Coagulation defect, unspecified: Secondary | ICD-10-CM | POA: Diagnosis not present

## 2012-01-19 DIAGNOSIS — I2782 Chronic pulmonary embolism: Secondary | ICD-10-CM | POA: Diagnosis not present

## 2012-01-19 DIAGNOSIS — Z7901 Long term (current) use of anticoagulants: Secondary | ICD-10-CM | POA: Diagnosis not present

## 2012-01-23 DIAGNOSIS — Z86711 Personal history of pulmonary embolism: Secondary | ICD-10-CM | POA: Diagnosis not present

## 2012-01-23 DIAGNOSIS — R5381 Other malaise: Secondary | ICD-10-CM | POA: Diagnosis not present

## 2012-01-23 DIAGNOSIS — R5383 Other fatigue: Secondary | ICD-10-CM | POA: Diagnosis not present

## 2012-01-23 DIAGNOSIS — D689 Coagulation defect, unspecified: Secondary | ICD-10-CM | POA: Diagnosis not present

## 2012-01-23 DIAGNOSIS — Z7901 Long term (current) use of anticoagulants: Secondary | ICD-10-CM | POA: Diagnosis not present

## 2012-01-23 DIAGNOSIS — R609 Edema, unspecified: Secondary | ICD-10-CM | POA: Diagnosis not present

## 2012-01-23 DIAGNOSIS — D6859 Other primary thrombophilia: Secondary | ICD-10-CM | POA: Diagnosis not present

## 2012-01-23 DIAGNOSIS — Z5181 Encounter for therapeutic drug level monitoring: Secondary | ICD-10-CM | POA: Diagnosis not present

## 2012-01-23 DIAGNOSIS — Z86718 Personal history of other venous thrombosis and embolism: Secondary | ICD-10-CM | POA: Diagnosis not present

## 2012-01-27 DIAGNOSIS — D689 Coagulation defect, unspecified: Secondary | ICD-10-CM | POA: Diagnosis not present

## 2012-01-27 DIAGNOSIS — E119 Type 2 diabetes mellitus without complications: Secondary | ICD-10-CM | POA: Diagnosis not present

## 2012-01-27 DIAGNOSIS — F259 Schizoaffective disorder, unspecified: Secondary | ICD-10-CM | POA: Diagnosis not present

## 2012-01-27 DIAGNOSIS — G5790 Unspecified mononeuropathy of unspecified lower limb: Secondary | ICD-10-CM | POA: Diagnosis not present

## 2012-01-30 DIAGNOSIS — Z7901 Long term (current) use of anticoagulants: Secondary | ICD-10-CM | POA: Diagnosis not present

## 2012-01-30 DIAGNOSIS — Z86718 Personal history of other venous thrombosis and embolism: Secondary | ICD-10-CM | POA: Diagnosis not present

## 2012-01-30 DIAGNOSIS — Z86711 Personal history of pulmonary embolism: Secondary | ICD-10-CM | POA: Diagnosis not present

## 2012-01-30 DIAGNOSIS — D6859 Other primary thrombophilia: Secondary | ICD-10-CM | POA: Diagnosis not present

## 2012-02-11 DIAGNOSIS — F259 Schizoaffective disorder, unspecified: Secondary | ICD-10-CM | POA: Diagnosis not present

## 2012-02-11 DIAGNOSIS — I10 Essential (primary) hypertension: Secondary | ICD-10-CM | POA: Diagnosis not present

## 2012-02-11 DIAGNOSIS — Z7901 Long term (current) use of anticoagulants: Secondary | ICD-10-CM | POA: Diagnosis not present

## 2012-02-11 DIAGNOSIS — R0789 Other chest pain: Secondary | ICD-10-CM | POA: Diagnosis not present

## 2012-02-11 DIAGNOSIS — M4800 Spinal stenosis, site unspecified: Secondary | ICD-10-CM | POA: Diagnosis not present

## 2012-02-11 DIAGNOSIS — K219 Gastro-esophageal reflux disease without esophagitis: Secondary | ICD-10-CM | POA: Diagnosis not present

## 2012-02-11 DIAGNOSIS — I4891 Unspecified atrial fibrillation: Secondary | ICD-10-CM | POA: Diagnosis not present

## 2012-02-11 DIAGNOSIS — Z9079 Acquired absence of other genital organ(s): Secondary | ICD-10-CM | POA: Diagnosis not present

## 2012-02-11 DIAGNOSIS — E039 Hypothyroidism, unspecified: Secondary | ICD-10-CM | POA: Diagnosis not present

## 2012-02-11 DIAGNOSIS — G589 Mononeuropathy, unspecified: Secondary | ICD-10-CM | POA: Diagnosis not present

## 2012-02-11 DIAGNOSIS — E079 Disorder of thyroid, unspecified: Secondary | ICD-10-CM | POA: Diagnosis not present

## 2012-02-11 DIAGNOSIS — E119 Type 2 diabetes mellitus without complications: Secondary | ICD-10-CM | POA: Diagnosis not present

## 2012-02-11 DIAGNOSIS — R791 Abnormal coagulation profile: Secondary | ICD-10-CM | POA: Diagnosis not present

## 2012-02-11 DIAGNOSIS — R079 Chest pain, unspecified: Secondary | ICD-10-CM | POA: Diagnosis not present

## 2012-02-11 DIAGNOSIS — E7800 Pure hypercholesterolemia, unspecified: Secondary | ICD-10-CM | POA: Diagnosis not present

## 2012-02-12 DIAGNOSIS — R079 Chest pain, unspecified: Secondary | ICD-10-CM | POA: Diagnosis not present

## 2012-02-12 DIAGNOSIS — I2510 Atherosclerotic heart disease of native coronary artery without angina pectoris: Secondary | ICD-10-CM | POA: Diagnosis not present

## 2012-02-19 DIAGNOSIS — F3289 Other specified depressive episodes: Secondary | ICD-10-CM | POA: Diagnosis not present

## 2012-02-19 DIAGNOSIS — R4789 Other speech disturbances: Secondary | ICD-10-CM | POA: Diagnosis not present

## 2012-02-19 DIAGNOSIS — R630 Anorexia: Secondary | ICD-10-CM | POA: Diagnosis not present

## 2012-02-19 DIAGNOSIS — E119 Type 2 diabetes mellitus without complications: Secondary | ICD-10-CM | POA: Diagnosis not present

## 2012-02-19 DIAGNOSIS — E162 Hypoglycemia, unspecified: Secondary | ICD-10-CM | POA: Diagnosis not present

## 2012-02-19 DIAGNOSIS — I2699 Other pulmonary embolism without acute cor pulmonale: Secondary | ICD-10-CM | POA: Diagnosis not present

## 2012-02-19 DIAGNOSIS — R5381 Other malaise: Secondary | ICD-10-CM | POA: Diagnosis not present

## 2012-02-19 DIAGNOSIS — R079 Chest pain, unspecified: Secondary | ICD-10-CM | POA: Diagnosis not present

## 2012-02-19 DIAGNOSIS — R5383 Other fatigue: Secondary | ICD-10-CM | POA: Diagnosis not present

## 2012-02-19 DIAGNOSIS — F329 Major depressive disorder, single episode, unspecified: Secondary | ICD-10-CM | POA: Diagnosis not present

## 2012-02-24 DIAGNOSIS — R079 Chest pain, unspecified: Secondary | ICD-10-CM | POA: Diagnosis not present

## 2012-02-24 DIAGNOSIS — S1093XA Contusion of unspecified part of neck, initial encounter: Secondary | ICD-10-CM | POA: Diagnosis not present

## 2012-02-24 DIAGNOSIS — IMO0002 Reserved for concepts with insufficient information to code with codable children: Secondary | ICD-10-CM | POA: Diagnosis not present

## 2012-02-24 DIAGNOSIS — T148XXA Other injury of unspecified body region, initial encounter: Secondary | ICD-10-CM | POA: Diagnosis not present

## 2012-02-24 DIAGNOSIS — S0083XA Contusion of other part of head, initial encounter: Secondary | ICD-10-CM | POA: Diagnosis not present

## 2012-02-24 DIAGNOSIS — H5710 Ocular pain, unspecified eye: Secondary | ICD-10-CM | POA: Diagnosis not present

## 2012-02-24 DIAGNOSIS — S0003XA Contusion of scalp, initial encounter: Secondary | ICD-10-CM | POA: Diagnosis not present

## 2012-02-24 DIAGNOSIS — S20219A Contusion of unspecified front wall of thorax, initial encounter: Secondary | ICD-10-CM | POA: Diagnosis not present

## 2012-02-27 DIAGNOSIS — I4891 Unspecified atrial fibrillation: Secondary | ICD-10-CM | POA: Diagnosis not present

## 2012-02-27 DIAGNOSIS — E119 Type 2 diabetes mellitus without complications: Secondary | ICD-10-CM | POA: Diagnosis not present

## 2012-02-27 DIAGNOSIS — Z951 Presence of aortocoronary bypass graft: Secondary | ICD-10-CM | POA: Diagnosis not present

## 2012-02-27 DIAGNOSIS — R079 Chest pain, unspecified: Secondary | ICD-10-CM | POA: Diagnosis not present

## 2012-02-27 DIAGNOSIS — Z86718 Personal history of other venous thrombosis and embolism: Secondary | ICD-10-CM | POA: Diagnosis not present

## 2012-02-27 DIAGNOSIS — I2510 Atherosclerotic heart disease of native coronary artery without angina pectoris: Secondary | ICD-10-CM | POA: Diagnosis not present

## 2012-02-27 DIAGNOSIS — R0789 Other chest pain: Secondary | ICD-10-CM | POA: Diagnosis not present

## 2012-03-01 DIAGNOSIS — I10 Essential (primary) hypertension: Secondary | ICD-10-CM | POA: Diagnosis not present

## 2012-03-01 DIAGNOSIS — Z111 Encounter for screening for respiratory tuberculosis: Secondary | ICD-10-CM | POA: Diagnosis not present

## 2012-03-08 DIAGNOSIS — M5412 Radiculopathy, cervical region: Secondary | ICD-10-CM | POA: Diagnosis not present

## 2012-03-12 DIAGNOSIS — M25559 Pain in unspecified hip: Secondary | ICD-10-CM | POA: Diagnosis not present

## 2012-03-12 DIAGNOSIS — M545 Low back pain, unspecified: Secondary | ICD-10-CM | POA: Diagnosis not present

## 2012-03-12 DIAGNOSIS — R52 Pain, unspecified: Secondary | ICD-10-CM | POA: Diagnosis not present

## 2012-03-19 DIAGNOSIS — I2699 Other pulmonary embolism without acute cor pulmonale: Secondary | ICD-10-CM | POA: Diagnosis not present

## 2012-03-19 DIAGNOSIS — J309 Allergic rhinitis, unspecified: Secondary | ICD-10-CM | POA: Diagnosis not present

## 2012-03-19 DIAGNOSIS — E039 Hypothyroidism, unspecified: Secondary | ICD-10-CM | POA: Diagnosis not present

## 2012-03-19 DIAGNOSIS — Z79899 Other long term (current) drug therapy: Secondary | ICD-10-CM | POA: Diagnosis not present

## 2012-03-19 DIAGNOSIS — E119 Type 2 diabetes mellitus without complications: Secondary | ICD-10-CM | POA: Diagnosis not present

## 2012-03-25 DIAGNOSIS — L03039 Cellulitis of unspecified toe: Secondary | ICD-10-CM | POA: Diagnosis not present

## 2012-03-25 DIAGNOSIS — M549 Dorsalgia, unspecified: Secondary | ICD-10-CM | POA: Diagnosis not present

## 2012-03-25 DIAGNOSIS — M545 Low back pain, unspecified: Secondary | ICD-10-CM | POA: Diagnosis not present

## 2012-03-27 DIAGNOSIS — S0993XA Unspecified injury of face, initial encounter: Secondary | ICD-10-CM | POA: Diagnosis not present

## 2012-03-27 DIAGNOSIS — S0990XA Unspecified injury of head, initial encounter: Secondary | ICD-10-CM | POA: Diagnosis not present

## 2012-03-27 DIAGNOSIS — S60229A Contusion of unspecified hand, initial encounter: Secondary | ICD-10-CM | POA: Diagnosis not present

## 2012-03-27 DIAGNOSIS — IMO0001 Reserved for inherently not codable concepts without codable children: Secondary | ICD-10-CM | POA: Diagnosis not present

## 2012-03-27 DIAGNOSIS — S61409A Unspecified open wound of unspecified hand, initial encounter: Secondary | ICD-10-CM | POA: Diagnosis not present

## 2012-03-27 DIAGNOSIS — S6990XA Unspecified injury of unspecified wrist, hand and finger(s), initial encounter: Secondary | ICD-10-CM | POA: Diagnosis not present

## 2012-03-27 DIAGNOSIS — M2550 Pain in unspecified joint: Secondary | ICD-10-CM | POA: Diagnosis not present

## 2012-03-27 DIAGNOSIS — R10819 Abdominal tenderness, unspecified site: Secondary | ICD-10-CM | POA: Diagnosis not present

## 2012-03-27 DIAGNOSIS — T07XXXA Unspecified multiple injuries, initial encounter: Secondary | ICD-10-CM | POA: Diagnosis not present

## 2012-03-27 DIAGNOSIS — S279XXA Injury of unspecified intrathoracic organ, initial encounter: Secondary | ICD-10-CM | POA: Diagnosis not present

## 2012-03-27 DIAGNOSIS — S298XXA Other specified injuries of thorax, initial encounter: Secondary | ICD-10-CM | POA: Diagnosis not present

## 2012-03-27 DIAGNOSIS — S199XXA Unspecified injury of neck, initial encounter: Secondary | ICD-10-CM | POA: Diagnosis not present

## 2012-03-27 DIAGNOSIS — M549 Dorsalgia, unspecified: Secondary | ICD-10-CM | POA: Diagnosis not present

## 2012-03-27 DIAGNOSIS — S060X9A Concussion with loss of consciousness of unspecified duration, initial encounter: Secondary | ICD-10-CM | POA: Diagnosis not present

## 2012-03-27 DIAGNOSIS — S0100XA Unspecified open wound of scalp, initial encounter: Secondary | ICD-10-CM | POA: Diagnosis not present

## 2012-03-27 DIAGNOSIS — IMO0002 Reserved for concepts with insufficient information to code with codable children: Secondary | ICD-10-CM | POA: Diagnosis not present

## 2012-03-27 DIAGNOSIS — S3690XA Unspecified injury of unspecified intra-abdominal organ, initial encounter: Secondary | ICD-10-CM | POA: Diagnosis not present

## 2012-03-28 DIAGNOSIS — S6990XA Unspecified injury of unspecified wrist, hand and finger(s), initial encounter: Secondary | ICD-10-CM | POA: Diagnosis not present

## 2012-03-28 DIAGNOSIS — M549 Dorsalgia, unspecified: Secondary | ICD-10-CM | POA: Diagnosis not present

## 2012-03-28 DIAGNOSIS — R000 Tachycardia, unspecified: Secondary | ICD-10-CM | POA: Diagnosis not present

## 2012-03-28 DIAGNOSIS — S0990XA Unspecified injury of head, initial encounter: Secondary | ICD-10-CM | POA: Diagnosis not present

## 2012-03-28 DIAGNOSIS — S60229A Contusion of unspecified hand, initial encounter: Secondary | ICD-10-CM | POA: Diagnosis not present

## 2012-03-31 DIAGNOSIS — I82409 Acute embolism and thrombosis of unspecified deep veins of unspecified lower extremity: Secondary | ICD-10-CM | POA: Diagnosis not present

## 2012-03-31 DIAGNOSIS — I2699 Other pulmonary embolism without acute cor pulmonale: Secondary | ICD-10-CM | POA: Diagnosis not present

## 2012-03-31 DIAGNOSIS — R21 Rash and other nonspecific skin eruption: Secondary | ICD-10-CM | POA: Diagnosis not present

## 2012-03-31 DIAGNOSIS — M79609 Pain in unspecified limb: Secondary | ICD-10-CM | POA: Diagnosis not present

## 2012-03-31 DIAGNOSIS — L089 Local infection of the skin and subcutaneous tissue, unspecified: Secondary | ICD-10-CM | POA: Diagnosis not present

## 2012-03-31 DIAGNOSIS — L299 Pruritus, unspecified: Secondary | ICD-10-CM | POA: Diagnosis not present

## 2012-04-03 DIAGNOSIS — M2559 Pain in other specified joint: Secondary | ICD-10-CM | POA: Diagnosis not present

## 2012-04-03 DIAGNOSIS — R6883 Chills (without fever): Secondary | ICD-10-CM | POA: Diagnosis not present

## 2012-04-03 DIAGNOSIS — M25559 Pain in unspecified hip: Secondary | ICD-10-CM | POA: Diagnosis not present

## 2012-04-03 DIAGNOSIS — E119 Type 2 diabetes mellitus without complications: Secondary | ICD-10-CM | POA: Diagnosis not present

## 2012-04-03 DIAGNOSIS — M76899 Other specified enthesopathies of unspecified lower limb, excluding foot: Secondary | ICD-10-CM | POA: Diagnosis not present

## 2012-04-06 DIAGNOSIS — Z86711 Personal history of pulmonary embolism: Secondary | ICD-10-CM | POA: Diagnosis not present

## 2012-04-06 DIAGNOSIS — R109 Unspecified abdominal pain: Secondary | ICD-10-CM | POA: Diagnosis not present

## 2012-04-06 DIAGNOSIS — I2782 Chronic pulmonary embolism: Secondary | ICD-10-CM | POA: Diagnosis not present

## 2012-04-06 DIAGNOSIS — M542 Cervicalgia: Secondary | ICD-10-CM | POA: Diagnosis not present

## 2012-04-06 DIAGNOSIS — D509 Iron deficiency anemia, unspecified: Secondary | ICD-10-CM | POA: Diagnosis not present

## 2012-04-06 DIAGNOSIS — E119 Type 2 diabetes mellitus without complications: Secondary | ICD-10-CM | POA: Diagnosis not present

## 2012-04-06 DIAGNOSIS — Z888 Allergy status to other drugs, medicaments and biological substances status: Secondary | ICD-10-CM | POA: Diagnosis not present

## 2012-04-06 DIAGNOSIS — Z885 Allergy status to narcotic agent status: Secondary | ICD-10-CM | POA: Diagnosis not present

## 2012-04-06 DIAGNOSIS — R55 Syncope and collapse: Secondary | ICD-10-CM | POA: Diagnosis not present

## 2012-04-06 DIAGNOSIS — R0602 Shortness of breath: Secondary | ICD-10-CM | POA: Diagnosis not present

## 2012-04-06 DIAGNOSIS — Z880 Allergy status to penicillin: Secondary | ICD-10-CM | POA: Diagnosis not present

## 2012-04-06 DIAGNOSIS — I82629 Acute embolism and thrombosis of deep veins of unspecified upper extremity: Secondary | ICD-10-CM | POA: Diagnosis not present

## 2012-04-06 DIAGNOSIS — R61 Generalized hyperhidrosis: Secondary | ICD-10-CM | POA: Diagnosis not present

## 2012-04-06 DIAGNOSIS — M25529 Pain in unspecified elbow: Secondary | ICD-10-CM | POA: Diagnosis not present

## 2012-04-06 DIAGNOSIS — M1990 Unspecified osteoarthritis, unspecified site: Secondary | ICD-10-CM | POA: Diagnosis not present

## 2012-04-06 DIAGNOSIS — Z883 Allergy status to other anti-infective agents status: Secondary | ICD-10-CM | POA: Diagnosis not present

## 2012-04-06 DIAGNOSIS — M79609 Pain in unspecified limb: Secondary | ICD-10-CM | POA: Diagnosis not present

## 2012-04-11 DIAGNOSIS — R002 Palpitations: Secondary | ICD-10-CM | POA: Diagnosis not present

## 2012-04-11 DIAGNOSIS — M7989 Other specified soft tissue disorders: Secondary | ICD-10-CM | POA: Diagnosis not present

## 2012-04-12 DIAGNOSIS — I2782 Chronic pulmonary embolism: Secondary | ICD-10-CM | POA: Diagnosis not present

## 2012-04-12 DIAGNOSIS — I82409 Acute embolism and thrombosis of unspecified deep veins of unspecified lower extremity: Secondary | ICD-10-CM | POA: Diagnosis not present

## 2012-04-12 DIAGNOSIS — M79609 Pain in unspecified limb: Secondary | ICD-10-CM | POA: Diagnosis not present

## 2012-04-13 DIAGNOSIS — R109 Unspecified abdominal pain: Secondary | ICD-10-CM | POA: Diagnosis not present

## 2012-04-13 DIAGNOSIS — I2782 Chronic pulmonary embolism: Secondary | ICD-10-CM | POA: Diagnosis not present

## 2012-04-13 DIAGNOSIS — I82409 Acute embolism and thrombosis of unspecified deep veins of unspecified lower extremity: Secondary | ICD-10-CM | POA: Diagnosis not present

## 2012-04-14 DIAGNOSIS — I82409 Acute embolism and thrombosis of unspecified deep veins of unspecified lower extremity: Secondary | ICD-10-CM | POA: Diagnosis not present

## 2012-04-14 DIAGNOSIS — I2782 Chronic pulmonary embolism: Secondary | ICD-10-CM | POA: Diagnosis not present

## 2012-04-15 DIAGNOSIS — R5381 Other malaise: Secondary | ICD-10-CM | POA: Diagnosis not present

## 2012-04-15 DIAGNOSIS — M542 Cervicalgia: Secondary | ICD-10-CM | POA: Diagnosis not present

## 2012-04-15 DIAGNOSIS — R0602 Shortness of breath: Secondary | ICD-10-CM | POA: Diagnosis not present

## 2012-04-15 DIAGNOSIS — R61 Generalized hyperhidrosis: Secondary | ICD-10-CM | POA: Diagnosis not present

## 2012-04-15 DIAGNOSIS — R5383 Other fatigue: Secondary | ICD-10-CM | POA: Diagnosis not present

## 2012-04-15 DIAGNOSIS — I2782 Chronic pulmonary embolism: Secondary | ICD-10-CM | POA: Diagnosis not present

## 2012-04-15 DIAGNOSIS — R42 Dizziness and giddiness: Secondary | ICD-10-CM | POA: Diagnosis not present

## 2012-04-15 DIAGNOSIS — I82409 Acute embolism and thrombosis of unspecified deep veins of unspecified lower extremity: Secondary | ICD-10-CM | POA: Diagnosis not present

## 2012-04-15 DIAGNOSIS — R55 Syncope and collapse: Secondary | ICD-10-CM | POA: Diagnosis not present

## 2012-04-16 DIAGNOSIS — S335XXA Sprain of ligaments of lumbar spine, initial encounter: Secondary | ICD-10-CM | POA: Diagnosis not present

## 2012-04-16 DIAGNOSIS — R1110 Vomiting, unspecified: Secondary | ICD-10-CM | POA: Diagnosis not present

## 2012-04-30 DIAGNOSIS — R52 Pain, unspecified: Secondary | ICD-10-CM | POA: Diagnosis not present

## 2012-04-30 DIAGNOSIS — M79609 Pain in unspecified limb: Secondary | ICD-10-CM | POA: Diagnosis not present

## 2012-04-30 DIAGNOSIS — R030 Elevated blood-pressure reading, without diagnosis of hypertension: Secondary | ICD-10-CM | POA: Diagnosis not present

## 2012-05-01 DIAGNOSIS — M79609 Pain in unspecified limb: Secondary | ICD-10-CM | POA: Diagnosis not present

## 2012-05-27 DIAGNOSIS — T07XXXA Unspecified multiple injuries, initial encounter: Secondary | ICD-10-CM | POA: Diagnosis not present

## 2012-05-27 DIAGNOSIS — M549 Dorsalgia, unspecified: Secondary | ICD-10-CM | POA: Diagnosis not present

## 2012-05-27 DIAGNOSIS — M79609 Pain in unspecified limb: Secondary | ICD-10-CM | POA: Diagnosis not present

## 2012-05-27 DIAGNOSIS — M542 Cervicalgia: Secondary | ICD-10-CM | POA: Diagnosis not present

## 2012-05-27 DIAGNOSIS — R5381 Other malaise: Secondary | ICD-10-CM | POA: Diagnosis not present

## 2012-05-28 DIAGNOSIS — IMO0002 Reserved for concepts with insufficient information to code with codable children: Secondary | ICD-10-CM | POA: Diagnosis not present

## 2012-05-28 DIAGNOSIS — M545 Low back pain, unspecified: Secondary | ICD-10-CM | POA: Diagnosis not present

## 2012-05-28 DIAGNOSIS — M542 Cervicalgia: Secondary | ICD-10-CM | POA: Diagnosis not present

## 2012-05-28 DIAGNOSIS — M549 Dorsalgia, unspecified: Secondary | ICD-10-CM | POA: Diagnosis not present

## 2012-05-28 DIAGNOSIS — M79609 Pain in unspecified limb: Secondary | ICD-10-CM | POA: Diagnosis not present

## 2012-05-28 DIAGNOSIS — M539 Dorsopathy, unspecified: Secondary | ICD-10-CM | POA: Diagnosis not present

## 2012-06-05 DIAGNOSIS — M79609 Pain in unspecified limb: Secondary | ICD-10-CM | POA: Diagnosis not present

## 2012-06-11 DIAGNOSIS — R0602 Shortness of breath: Secondary | ICD-10-CM | POA: Diagnosis not present

## 2012-06-11 DIAGNOSIS — D689 Coagulation defect, unspecified: Secondary | ICD-10-CM | POA: Diagnosis not present

## 2012-06-11 DIAGNOSIS — R5381 Other malaise: Secondary | ICD-10-CM | POA: Diagnosis not present

## 2012-06-11 DIAGNOSIS — R0989 Other specified symptoms and signs involving the circulatory and respiratory systems: Secondary | ICD-10-CM | POA: Diagnosis not present

## 2012-06-11 DIAGNOSIS — J209 Acute bronchitis, unspecified: Secondary | ICD-10-CM | POA: Diagnosis not present

## 2012-06-11 DIAGNOSIS — R0609 Other forms of dyspnea: Secondary | ICD-10-CM | POA: Diagnosis not present

## 2012-06-11 DIAGNOSIS — R5383 Other fatigue: Secondary | ICD-10-CM | POA: Diagnosis not present

## 2012-06-15 DIAGNOSIS — F259 Schizoaffective disorder, unspecified: Secondary | ICD-10-CM | POA: Diagnosis not present

## 2012-06-15 DIAGNOSIS — M542 Cervicalgia: Secondary | ICD-10-CM | POA: Diagnosis not present

## 2012-06-15 DIAGNOSIS — D689 Coagulation defect, unspecified: Secondary | ICD-10-CM | POA: Diagnosis not present

## 2012-06-15 DIAGNOSIS — K219 Gastro-esophageal reflux disease without esophagitis: Secondary | ICD-10-CM | POA: Diagnosis not present

## 2012-06-18 DIAGNOSIS — D689 Coagulation defect, unspecified: Secondary | ICD-10-CM | POA: Diagnosis not present

## 2012-06-18 DIAGNOSIS — F259 Schizoaffective disorder, unspecified: Secondary | ICD-10-CM | POA: Diagnosis not present

## 2012-06-22 DIAGNOSIS — F332 Major depressive disorder, recurrent severe without psychotic features: Secondary | ICD-10-CM | POA: Diagnosis not present

## 2012-06-23 DIAGNOSIS — Z23 Encounter for immunization: Secondary | ICD-10-CM | POA: Diagnosis not present

## 2012-06-23 DIAGNOSIS — J309 Allergic rhinitis, unspecified: Secondary | ICD-10-CM | POA: Diagnosis not present

## 2012-06-23 DIAGNOSIS — M79609 Pain in unspecified limb: Secondary | ICD-10-CM | POA: Diagnosis not present

## 2012-06-24 DIAGNOSIS — M79609 Pain in unspecified limb: Secondary | ICD-10-CM | POA: Diagnosis not present

## 2012-06-29 DIAGNOSIS — M79609 Pain in unspecified limb: Secondary | ICD-10-CM | POA: Diagnosis not present

## 2012-06-30 DIAGNOSIS — F332 Major depressive disorder, recurrent severe without psychotic features: Secondary | ICD-10-CM | POA: Diagnosis not present

## 2012-07-03 DIAGNOSIS — Z7901 Long term (current) use of anticoagulants: Secondary | ICD-10-CM | POA: Diagnosis not present

## 2012-07-03 DIAGNOSIS — M79609 Pain in unspecified limb: Secondary | ICD-10-CM | POA: Diagnosis not present

## 2012-07-03 DIAGNOSIS — R52 Pain, unspecified: Secondary | ICD-10-CM | POA: Diagnosis not present

## 2012-07-05 DIAGNOSIS — M79609 Pain in unspecified limb: Secondary | ICD-10-CM | POA: Diagnosis not present

## 2012-07-05 DIAGNOSIS — D689 Coagulation defect, unspecified: Secondary | ICD-10-CM | POA: Diagnosis not present

## 2012-07-05 DIAGNOSIS — R599 Enlarged lymph nodes, unspecified: Secondary | ICD-10-CM | POA: Diagnosis not present

## 2012-07-07 DIAGNOSIS — R000 Tachycardia, unspecified: Secondary | ICD-10-CM | POA: Diagnosis not present

## 2012-07-07 DIAGNOSIS — I10 Essential (primary) hypertension: Secondary | ICD-10-CM | POA: Diagnosis not present

## 2012-07-07 DIAGNOSIS — F332 Major depressive disorder, recurrent severe without psychotic features: Secondary | ICD-10-CM | POA: Diagnosis not present

## 2012-07-07 DIAGNOSIS — I2584 Coronary atherosclerosis due to calcified coronary lesion: Secondary | ICD-10-CM | POA: Diagnosis not present

## 2012-07-08 DIAGNOSIS — R52 Pain, unspecified: Secondary | ICD-10-CM | POA: Diagnosis not present

## 2012-07-08 DIAGNOSIS — R209 Unspecified disturbances of skin sensation: Secondary | ICD-10-CM | POA: Diagnosis not present

## 2012-07-08 DIAGNOSIS — R6889 Other general symptoms and signs: Secondary | ICD-10-CM | POA: Diagnosis not present

## 2012-07-08 DIAGNOSIS — M5412 Radiculopathy, cervical region: Secondary | ICD-10-CM | POA: Diagnosis not present

## 2012-07-12 DIAGNOSIS — I10 Essential (primary) hypertension: Secondary | ICD-10-CM | POA: Diagnosis not present

## 2012-07-12 DIAGNOSIS — D689 Coagulation defect, unspecified: Secondary | ICD-10-CM | POA: Diagnosis not present

## 2012-07-12 DIAGNOSIS — R209 Unspecified disturbances of skin sensation: Secondary | ICD-10-CM | POA: Diagnosis not present

## 2012-07-12 DIAGNOSIS — M4802 Spinal stenosis, cervical region: Secondary | ICD-10-CM | POA: Diagnosis not present

## 2012-07-15 DIAGNOSIS — F329 Major depressive disorder, single episode, unspecified: Secondary | ICD-10-CM | POA: Diagnosis not present

## 2012-07-15 DIAGNOSIS — I2510 Atherosclerotic heart disease of native coronary artery without angina pectoris: Secondary | ICD-10-CM | POA: Diagnosis not present

## 2012-07-15 DIAGNOSIS — E785 Hyperlipidemia, unspecified: Secondary | ICD-10-CM | POA: Diagnosis present

## 2012-07-15 DIAGNOSIS — F259 Schizoaffective disorder, unspecified: Secondary | ICD-10-CM | POA: Diagnosis not present

## 2012-07-15 DIAGNOSIS — Z86718 Personal history of other venous thrombosis and embolism: Secondary | ICD-10-CM | POA: Diagnosis not present

## 2012-07-15 DIAGNOSIS — Z7901 Long term (current) use of anticoagulants: Secondary | ICD-10-CM | POA: Diagnosis not present

## 2012-07-15 DIAGNOSIS — R45851 Suicidal ideations: Secondary | ICD-10-CM | POA: Diagnosis not present

## 2012-07-15 DIAGNOSIS — Z888 Allergy status to other drugs, medicaments and biological substances status: Secondary | ICD-10-CM | POA: Diagnosis not present

## 2012-07-15 DIAGNOSIS — F29 Unspecified psychosis not due to a substance or known physiological condition: Secondary | ICD-10-CM | POA: Diagnosis not present

## 2012-07-15 DIAGNOSIS — K219 Gastro-esophageal reflux disease without esophagitis: Secondary | ICD-10-CM | POA: Diagnosis present

## 2012-07-15 DIAGNOSIS — E119 Type 2 diabetes mellitus without complications: Secondary | ICD-10-CM | POA: Diagnosis present

## 2012-07-15 DIAGNOSIS — E039 Hypothyroidism, unspecified: Secondary | ICD-10-CM | POA: Diagnosis present

## 2012-07-15 DIAGNOSIS — M129 Arthropathy, unspecified: Secondary | ICD-10-CM | POA: Diagnosis present

## 2012-07-15 DIAGNOSIS — M25559 Pain in unspecified hip: Secondary | ICD-10-CM | POA: Diagnosis present

## 2012-07-15 DIAGNOSIS — F3289 Other specified depressive episodes: Secondary | ICD-10-CM | POA: Diagnosis not present

## 2012-07-15 DIAGNOSIS — Z86711 Personal history of pulmonary embolism: Secondary | ICD-10-CM | POA: Diagnosis not present

## 2012-07-15 DIAGNOSIS — F4310 Post-traumatic stress disorder, unspecified: Secondary | ICD-10-CM | POA: Diagnosis not present

## 2012-07-15 DIAGNOSIS — G8929 Other chronic pain: Secondary | ICD-10-CM | POA: Diagnosis not present

## 2012-07-15 DIAGNOSIS — M47812 Spondylosis without myelopathy or radiculopathy, cervical region: Secondary | ICD-10-CM | POA: Diagnosis present

## 2012-07-15 DIAGNOSIS — I10 Essential (primary) hypertension: Secondary | ICD-10-CM | POA: Diagnosis present

## 2012-07-15 DIAGNOSIS — R110 Nausea: Secondary | ICD-10-CM | POA: Diagnosis present

## 2012-07-21 DIAGNOSIS — D689 Coagulation defect, unspecified: Secondary | ICD-10-CM | POA: Diagnosis not present

## 2012-07-21 DIAGNOSIS — F333 Major depressive disorder, recurrent, severe with psychotic symptoms: Secondary | ICD-10-CM | POA: Diagnosis not present

## 2012-07-26 DIAGNOSIS — D689 Coagulation defect, unspecified: Secondary | ICD-10-CM | POA: Diagnosis not present

## 2012-07-26 DIAGNOSIS — I10 Essential (primary) hypertension: Secondary | ICD-10-CM | POA: Diagnosis not present

## 2012-07-26 DIAGNOSIS — I2584 Coronary atherosclerosis due to calcified coronary lesion: Secondary | ICD-10-CM | POA: Diagnosis not present

## 2012-07-26 DIAGNOSIS — R319 Hematuria, unspecified: Secondary | ICD-10-CM | POA: Diagnosis not present

## 2012-08-02 DIAGNOSIS — M2559 Pain in other specified joint: Secondary | ICD-10-CM | POA: Diagnosis not present

## 2012-08-02 DIAGNOSIS — M4802 Spinal stenosis, cervical region: Secondary | ICD-10-CM | POA: Diagnosis not present

## 2012-08-02 DIAGNOSIS — D689 Coagulation defect, unspecified: Secondary | ICD-10-CM | POA: Diagnosis not present

## 2012-08-02 DIAGNOSIS — M129 Arthropathy, unspecified: Secondary | ICD-10-CM | POA: Diagnosis not present

## 2012-08-02 DIAGNOSIS — F4310 Post-traumatic stress disorder, unspecified: Secondary | ICD-10-CM | POA: Diagnosis not present

## 2012-08-11 DIAGNOSIS — M129 Arthropathy, unspecified: Secondary | ICD-10-CM | POA: Diagnosis not present

## 2012-08-11 DIAGNOSIS — M4802 Spinal stenosis, cervical region: Secondary | ICD-10-CM | POA: Diagnosis not present

## 2012-08-12 DIAGNOSIS — M25559 Pain in unspecified hip: Secondary | ICD-10-CM | POA: Diagnosis not present

## 2012-08-12 DIAGNOSIS — M79609 Pain in unspecified limb: Secondary | ICD-10-CM | POA: Diagnosis not present

## 2012-08-13 DIAGNOSIS — M4802 Spinal stenosis, cervical region: Secondary | ICD-10-CM | POA: Diagnosis not present

## 2012-08-13 DIAGNOSIS — M5412 Radiculopathy, cervical region: Secondary | ICD-10-CM | POA: Diagnosis not present

## 2012-08-13 DIAGNOSIS — M4712 Other spondylosis with myelopathy, cervical region: Secondary | ICD-10-CM | POA: Diagnosis not present

## 2012-08-19 DIAGNOSIS — D689 Coagulation defect, unspecified: Secondary | ICD-10-CM | POA: Diagnosis not present

## 2012-08-19 DIAGNOSIS — Z113 Encounter for screening for infections with a predominantly sexual mode of transmission: Secondary | ICD-10-CM | POA: Diagnosis not present

## 2012-08-19 DIAGNOSIS — R319 Hematuria, unspecified: Secondary | ICD-10-CM | POA: Diagnosis not present

## 2012-08-19 DIAGNOSIS — Z1159 Encounter for screening for other viral diseases: Secondary | ICD-10-CM | POA: Diagnosis not present

## 2012-08-19 DIAGNOSIS — Z7251 High risk heterosexual behavior: Secondary | ICD-10-CM | POA: Diagnosis not present

## 2012-08-19 DIAGNOSIS — Z7289 Other problems related to lifestyle: Secondary | ICD-10-CM | POA: Diagnosis not present

## 2012-08-19 DIAGNOSIS — N898 Other specified noninflammatory disorders of vagina: Secondary | ICD-10-CM | POA: Diagnosis not present

## 2012-08-20 DIAGNOSIS — R0789 Other chest pain: Secondary | ICD-10-CM | POA: Diagnosis not present

## 2012-08-20 DIAGNOSIS — R002 Palpitations: Secondary | ICD-10-CM | POA: Diagnosis not present

## 2012-08-24 DIAGNOSIS — F329 Major depressive disorder, single episode, unspecified: Secondary | ICD-10-CM | POA: Diagnosis not present

## 2012-08-24 DIAGNOSIS — M2550 Pain in unspecified joint: Secondary | ICD-10-CM | POA: Diagnosis not present

## 2012-08-24 DIAGNOSIS — E7800 Pure hypercholesterolemia, unspecified: Secondary | ICD-10-CM | POA: Diagnosis not present

## 2012-08-24 DIAGNOSIS — F3289 Other specified depressive episodes: Secondary | ICD-10-CM | POA: Diagnosis not present

## 2012-08-27 DIAGNOSIS — M4802 Spinal stenosis, cervical region: Secondary | ICD-10-CM | POA: Diagnosis not present

## 2012-08-27 DIAGNOSIS — M4712 Other spondylosis with myelopathy, cervical region: Secondary | ICD-10-CM | POA: Diagnosis not present

## 2012-08-28 DIAGNOSIS — J069 Acute upper respiratory infection, unspecified: Secondary | ICD-10-CM | POA: Diagnosis not present

## 2012-08-28 DIAGNOSIS — D689 Coagulation defect, unspecified: Secondary | ICD-10-CM | POA: Diagnosis not present

## 2012-08-28 DIAGNOSIS — R319 Hematuria, unspecified: Secondary | ICD-10-CM | POA: Diagnosis not present

## 2012-08-28 DIAGNOSIS — M79609 Pain in unspecified limb: Secondary | ICD-10-CM | POA: Diagnosis not present

## 2012-08-28 DIAGNOSIS — R040 Epistaxis: Secondary | ICD-10-CM | POA: Diagnosis not present

## 2012-08-31 DIAGNOSIS — J069 Acute upper respiratory infection, unspecified: Secondary | ICD-10-CM | POA: Diagnosis not present

## 2012-08-31 DIAGNOSIS — I10 Essential (primary) hypertension: Secondary | ICD-10-CM | POA: Diagnosis not present

## 2012-08-31 DIAGNOSIS — D689 Coagulation defect, unspecified: Secondary | ICD-10-CM | POA: Diagnosis not present

## 2012-09-05 DIAGNOSIS — R0602 Shortness of breath: Secondary | ICD-10-CM | POA: Diagnosis not present

## 2012-09-05 DIAGNOSIS — M79609 Pain in unspecified limb: Secondary | ICD-10-CM | POA: Diagnosis not present

## 2012-09-05 DIAGNOSIS — M25529 Pain in unspecified elbow: Secondary | ICD-10-CM | POA: Diagnosis not present

## 2012-09-05 DIAGNOSIS — R6889 Other general symptoms and signs: Secondary | ICD-10-CM | POA: Diagnosis not present

## 2012-09-06 DIAGNOSIS — E119 Type 2 diabetes mellitus without complications: Secondary | ICD-10-CM | POA: Diagnosis not present

## 2012-09-06 DIAGNOSIS — M129 Arthropathy, unspecified: Secondary | ICD-10-CM | POA: Diagnosis not present

## 2012-09-06 DIAGNOSIS — D689 Coagulation defect, unspecified: Secondary | ICD-10-CM | POA: Diagnosis not present

## 2012-09-06 DIAGNOSIS — M79609 Pain in unspecified limb: Secondary | ICD-10-CM | POA: Diagnosis not present

## 2012-09-09 DIAGNOSIS — R002 Palpitations: Secondary | ICD-10-CM | POA: Diagnosis not present

## 2012-09-12 DIAGNOSIS — M7989 Other specified soft tissue disorders: Secondary | ICD-10-CM | POA: Diagnosis not present

## 2012-09-15 DIAGNOSIS — M1710 Unilateral primary osteoarthritis, unspecified knee: Secondary | ICD-10-CM | POA: Diagnosis not present

## 2012-09-15 DIAGNOSIS — M47817 Spondylosis without myelopathy or radiculopathy, lumbosacral region: Secondary | ICD-10-CM | POA: Diagnosis not present

## 2012-09-15 DIAGNOSIS — M549 Dorsalgia, unspecified: Secondary | ICD-10-CM | POA: Diagnosis not present

## 2012-09-15 DIAGNOSIS — M1610 Unilateral primary osteoarthritis, unspecified hip: Secondary | ICD-10-CM | POA: Diagnosis not present

## 2012-09-15 DIAGNOSIS — IMO0002 Reserved for concepts with insufficient information to code with codable children: Secondary | ICD-10-CM | POA: Diagnosis not present

## 2012-09-15 DIAGNOSIS — M169 Osteoarthritis of hip, unspecified: Secondary | ICD-10-CM | POA: Diagnosis not present

## 2012-09-17 DIAGNOSIS — M25559 Pain in unspecified hip: Secondary | ICD-10-CM | POA: Diagnosis not present

## 2012-09-17 DIAGNOSIS — M25529 Pain in unspecified elbow: Secondary | ICD-10-CM | POA: Diagnosis not present

## 2012-09-17 DIAGNOSIS — M1610 Unilateral primary osteoarthritis, unspecified hip: Secondary | ICD-10-CM | POA: Diagnosis not present

## 2012-09-17 DIAGNOSIS — M1710 Unilateral primary osteoarthritis, unspecified knee: Secondary | ICD-10-CM | POA: Diagnosis not present

## 2012-09-17 DIAGNOSIS — M19039 Primary osteoarthritis, unspecified wrist: Secondary | ICD-10-CM | POA: Diagnosis not present

## 2012-09-17 DIAGNOSIS — M25539 Pain in unspecified wrist: Secondary | ICD-10-CM | POA: Diagnosis not present

## 2012-09-17 DIAGNOSIS — M549 Dorsalgia, unspecified: Secondary | ICD-10-CM | POA: Diagnosis not present

## 2012-09-17 DIAGNOSIS — M19029 Primary osteoarthritis, unspecified elbow: Secondary | ICD-10-CM | POA: Diagnosis not present

## 2012-09-17 DIAGNOSIS — M169 Osteoarthritis of hip, unspecified: Secondary | ICD-10-CM | POA: Diagnosis not present

## 2012-09-17 DIAGNOSIS — IMO0002 Reserved for concepts with insufficient information to code with codable children: Secondary | ICD-10-CM | POA: Diagnosis not present

## 2012-09-29 DIAGNOSIS — D689 Coagulation defect, unspecified: Secondary | ICD-10-CM | POA: Diagnosis not present

## 2012-09-29 DIAGNOSIS — M4802 Spinal stenosis, cervical region: Secondary | ICD-10-CM | POA: Diagnosis not present

## 2012-09-29 DIAGNOSIS — M79609 Pain in unspecified limb: Secondary | ICD-10-CM | POA: Diagnosis not present

## 2012-09-29 DIAGNOSIS — M5030 Other cervical disc degeneration, unspecified cervical region: Secondary | ICD-10-CM | POA: Diagnosis not present

## 2012-09-30 DIAGNOSIS — Z86711 Personal history of pulmonary embolism: Secondary | ICD-10-CM | POA: Diagnosis not present

## 2012-10-01 DIAGNOSIS — M4712 Other spondylosis with myelopathy, cervical region: Secondary | ICD-10-CM | POA: Diagnosis not present

## 2012-10-01 DIAGNOSIS — M4802 Spinal stenosis, cervical region: Secondary | ICD-10-CM | POA: Diagnosis not present

## 2012-10-01 DIAGNOSIS — M5412 Radiculopathy, cervical region: Secondary | ICD-10-CM | POA: Diagnosis not present

## 2012-10-14 DIAGNOSIS — I495 Sick sinus syndrome: Secondary | ICD-10-CM | POA: Diagnosis not present

## 2012-10-15 DIAGNOSIS — G8929 Other chronic pain: Secondary | ICD-10-CM | POA: Diagnosis not present

## 2012-10-15 DIAGNOSIS — M549 Dorsalgia, unspecified: Secondary | ICD-10-CM | POA: Diagnosis not present

## 2012-10-15 DIAGNOSIS — R52 Pain, unspecified: Secondary | ICD-10-CM | POA: Diagnosis not present

## 2012-10-15 DIAGNOSIS — M79609 Pain in unspecified limb: Secondary | ICD-10-CM | POA: Diagnosis not present

## 2012-10-15 DIAGNOSIS — M25559 Pain in unspecified hip: Secondary | ICD-10-CM | POA: Diagnosis not present

## 2012-10-15 DIAGNOSIS — M545 Low back pain, unspecified: Secondary | ICD-10-CM | POA: Diagnosis not present

## 2012-10-19 DIAGNOSIS — M1610 Unilateral primary osteoarthritis, unspecified hip: Secondary | ICD-10-CM | POA: Diagnosis not present

## 2012-10-19 DIAGNOSIS — M169 Osteoarthritis of hip, unspecified: Secondary | ICD-10-CM | POA: Diagnosis not present

## 2012-10-19 DIAGNOSIS — N3000 Acute cystitis without hematuria: Secondary | ICD-10-CM | POA: Diagnosis not present

## 2012-10-20 DIAGNOSIS — M4802 Spinal stenosis, cervical region: Secondary | ICD-10-CM | POA: Diagnosis not present

## 2012-10-20 DIAGNOSIS — M5412 Radiculopathy, cervical region: Secondary | ICD-10-CM | POA: Diagnosis not present

## 2012-10-20 DIAGNOSIS — M4712 Other spondylosis with myelopathy, cervical region: Secondary | ICD-10-CM | POA: Diagnosis not present

## 2012-10-21 DIAGNOSIS — Z86711 Personal history of pulmonary embolism: Secondary | ICD-10-CM | POA: Diagnosis not present

## 2012-10-30 HISTORY — PX: ANTERIOR CERVICAL DECOMP/DISCECTOMY FUSION: SHX1161

## 2012-11-03 DIAGNOSIS — I369 Nonrheumatic tricuspid valve disorder, unspecified: Secondary | ICD-10-CM | POA: Diagnosis not present

## 2012-11-03 DIAGNOSIS — R002 Palpitations: Secondary | ICD-10-CM | POA: Diagnosis not present

## 2012-11-10 DIAGNOSIS — Z01818 Encounter for other preprocedural examination: Secondary | ICD-10-CM | POA: Diagnosis not present

## 2012-11-10 DIAGNOSIS — M5412 Radiculopathy, cervical region: Secondary | ICD-10-CM | POA: Diagnosis not present

## 2012-11-12 DIAGNOSIS — G894 Chronic pain syndrome: Secondary | ICD-10-CM | POA: Diagnosis not present

## 2012-11-12 DIAGNOSIS — M25559 Pain in unspecified hip: Secondary | ICD-10-CM | POA: Diagnosis not present

## 2012-11-16 DIAGNOSIS — F3289 Other specified depressive episodes: Secondary | ICD-10-CM | POA: Diagnosis present

## 2012-11-16 DIAGNOSIS — Z87891 Personal history of nicotine dependence: Secondary | ICD-10-CM | POA: Diagnosis not present

## 2012-11-16 DIAGNOSIS — G4730 Sleep apnea, unspecified: Secondary | ICD-10-CM | POA: Diagnosis present

## 2012-11-16 DIAGNOSIS — G43909 Migraine, unspecified, not intractable, without status migrainosus: Secondary | ICD-10-CM | POA: Diagnosis present

## 2012-11-16 DIAGNOSIS — Z885 Allergy status to narcotic agent status: Secondary | ICD-10-CM | POA: Diagnosis not present

## 2012-11-16 DIAGNOSIS — M4712 Other spondylosis with myelopathy, cervical region: Secondary | ICD-10-CM | POA: Diagnosis not present

## 2012-11-16 DIAGNOSIS — M5000 Cervical disc disorder with myelopathy, unspecified cervical region: Secondary | ICD-10-CM | POA: Diagnosis present

## 2012-11-16 DIAGNOSIS — M129 Arthropathy, unspecified: Secondary | ICD-10-CM | POA: Diagnosis present

## 2012-11-16 DIAGNOSIS — F329 Major depressive disorder, single episode, unspecified: Secondary | ICD-10-CM | POA: Diagnosis present

## 2012-11-16 DIAGNOSIS — K589 Irritable bowel syndrome without diarrhea: Secondary | ICD-10-CM | POA: Diagnosis present

## 2012-11-16 DIAGNOSIS — E039 Hypothyroidism, unspecified: Secondary | ICD-10-CM | POA: Diagnosis present

## 2012-11-16 DIAGNOSIS — Z86711 Personal history of pulmonary embolism: Secondary | ICD-10-CM | POA: Diagnosis not present

## 2012-11-16 DIAGNOSIS — K219 Gastro-esophageal reflux disease without esophagitis: Secondary | ICD-10-CM | POA: Diagnosis present

## 2012-11-16 DIAGNOSIS — I10 Essential (primary) hypertension: Secondary | ICD-10-CM | POA: Diagnosis present

## 2012-11-16 DIAGNOSIS — M5412 Radiculopathy, cervical region: Secondary | ICD-10-CM | POA: Diagnosis not present

## 2012-11-16 DIAGNOSIS — R32 Unspecified urinary incontinence: Secondary | ICD-10-CM | POA: Diagnosis present

## 2012-11-16 DIAGNOSIS — Z86718 Personal history of other venous thrombosis and embolism: Secondary | ICD-10-CM | POA: Diagnosis not present

## 2012-11-16 DIAGNOSIS — M5030 Other cervical disc degeneration, unspecified cervical region: Secondary | ICD-10-CM | POA: Diagnosis not present

## 2012-11-16 DIAGNOSIS — Z981 Arthrodesis status: Secondary | ICD-10-CM | POA: Diagnosis not present

## 2012-11-16 DIAGNOSIS — Z888 Allergy status to other drugs, medicaments and biological substances status: Secondary | ICD-10-CM | POA: Diagnosis not present

## 2012-11-16 DIAGNOSIS — M5382 Other specified dorsopathies, cervical region: Secondary | ICD-10-CM | POA: Diagnosis not present

## 2012-11-16 DIAGNOSIS — E669 Obesity, unspecified: Secondary | ICD-10-CM | POA: Diagnosis present

## 2012-11-16 DIAGNOSIS — E119 Type 2 diabetes mellitus without complications: Secondary | ICD-10-CM | POA: Diagnosis present

## 2012-11-16 DIAGNOSIS — Z6832 Body mass index (BMI) 32.0-32.9, adult: Secondary | ICD-10-CM | POA: Diagnosis not present

## 2012-11-16 DIAGNOSIS — Z886 Allergy status to analgesic agent status: Secondary | ICD-10-CM | POA: Diagnosis not present

## 2012-11-16 DIAGNOSIS — Z880 Allergy status to penicillin: Secondary | ICD-10-CM | POA: Diagnosis not present

## 2012-11-16 DIAGNOSIS — E785 Hyperlipidemia, unspecified: Secondary | ICD-10-CM | POA: Diagnosis present

## 2012-11-21 DIAGNOSIS — M25519 Pain in unspecified shoulder: Secondary | ICD-10-CM | POA: Diagnosis not present

## 2012-11-21 DIAGNOSIS — R220 Localized swelling, mass and lump, head: Secondary | ICD-10-CM | POA: Diagnosis not present

## 2012-11-21 DIAGNOSIS — Z9889 Other specified postprocedural states: Secondary | ICD-10-CM | POA: Diagnosis not present

## 2012-11-21 DIAGNOSIS — R0602 Shortness of breath: Secondary | ICD-10-CM | POA: Diagnosis not present

## 2012-11-28 DIAGNOSIS — E119 Type 2 diabetes mellitus without complications: Secondary | ICD-10-CM | POA: Diagnosis not present

## 2012-11-28 DIAGNOSIS — M542 Cervicalgia: Secondary | ICD-10-CM | POA: Diagnosis not present

## 2012-11-28 DIAGNOSIS — R6889 Other general symptoms and signs: Secondary | ICD-10-CM | POA: Diagnosis not present

## 2012-11-28 DIAGNOSIS — I10 Essential (primary) hypertension: Secondary | ICD-10-CM | POA: Diagnosis not present

## 2012-11-28 DIAGNOSIS — R52 Pain, unspecified: Secondary | ICD-10-CM | POA: Diagnosis not present

## 2012-12-06 DIAGNOSIS — Z86711 Personal history of pulmonary embolism: Secondary | ICD-10-CM | POA: Diagnosis not present

## 2012-12-31 DIAGNOSIS — M79609 Pain in unspecified limb: Secondary | ICD-10-CM | POA: Diagnosis not present

## 2012-12-31 DIAGNOSIS — I10 Essential (primary) hypertension: Secondary | ICD-10-CM | POA: Diagnosis not present

## 2013-01-06 DIAGNOSIS — K625 Hemorrhage of anus and rectum: Secondary | ICD-10-CM | POA: Diagnosis not present

## 2013-01-06 DIAGNOSIS — R0989 Other specified symptoms and signs involving the circulatory and respiratory systems: Secondary | ICD-10-CM | POA: Diagnosis not present

## 2013-01-06 DIAGNOSIS — R0609 Other forms of dyspnea: Secondary | ICD-10-CM | POA: Diagnosis not present

## 2013-01-06 DIAGNOSIS — I10 Essential (primary) hypertension: Secondary | ICD-10-CM | POA: Diagnosis not present

## 2013-01-06 DIAGNOSIS — R109 Unspecified abdominal pain: Secondary | ICD-10-CM | POA: Diagnosis not present

## 2013-01-07 DIAGNOSIS — K921 Melena: Secondary | ICD-10-CM | POA: Diagnosis not present

## 2013-01-07 DIAGNOSIS — M79609 Pain in unspecified limb: Secondary | ICD-10-CM | POA: Diagnosis not present

## 2013-01-07 DIAGNOSIS — E119 Type 2 diabetes mellitus without complications: Secondary | ICD-10-CM | POA: Diagnosis not present

## 2013-01-07 DIAGNOSIS — R52 Pain, unspecified: Secondary | ICD-10-CM | POA: Diagnosis not present

## 2013-01-07 DIAGNOSIS — R011 Cardiac murmur, unspecified: Secondary | ICD-10-CM | POA: Diagnosis not present

## 2013-01-07 DIAGNOSIS — R609 Edema, unspecified: Secondary | ICD-10-CM | POA: Diagnosis not present

## 2013-01-07 DIAGNOSIS — R109 Unspecified abdominal pain: Secondary | ICD-10-CM | POA: Diagnosis not present

## 2013-01-07 DIAGNOSIS — M7989 Other specified soft tissue disorders: Secondary | ICD-10-CM | POA: Diagnosis not present

## 2013-01-07 DIAGNOSIS — K625 Hemorrhage of anus and rectum: Secondary | ICD-10-CM | POA: Diagnosis not present

## 2013-01-07 DIAGNOSIS — K2970 Gastritis, unspecified, without bleeding: Secondary | ICD-10-CM | POA: Diagnosis not present

## 2013-01-07 DIAGNOSIS — I10 Essential (primary) hypertension: Secondary | ICD-10-CM | POA: Diagnosis not present

## 2013-01-07 DIAGNOSIS — R1084 Generalized abdominal pain: Secondary | ICD-10-CM | POA: Diagnosis not present

## 2013-01-07 DIAGNOSIS — R197 Diarrhea, unspecified: Secondary | ICD-10-CM | POA: Diagnosis not present

## 2013-01-13 DIAGNOSIS — I10 Essential (primary) hypertension: Secondary | ICD-10-CM | POA: Diagnosis not present

## 2013-01-13 DIAGNOSIS — H538 Other visual disturbances: Secondary | ICD-10-CM | POA: Diagnosis not present

## 2013-01-20 DIAGNOSIS — R109 Unspecified abdominal pain: Secondary | ICD-10-CM | POA: Diagnosis not present

## 2013-01-20 DIAGNOSIS — R197 Diarrhea, unspecified: Secondary | ICD-10-CM | POA: Diagnosis not present

## 2013-01-20 DIAGNOSIS — R002 Palpitations: Secondary | ICD-10-CM | POA: Diagnosis not present

## 2013-01-20 DIAGNOSIS — I10 Essential (primary) hypertension: Secondary | ICD-10-CM | POA: Diagnosis not present

## 2013-02-02 DIAGNOSIS — E785 Hyperlipidemia, unspecified: Secondary | ICD-10-CM | POA: Diagnosis not present

## 2013-02-02 DIAGNOSIS — E119 Type 2 diabetes mellitus without complications: Secondary | ICD-10-CM | POA: Diagnosis not present

## 2013-02-09 DIAGNOSIS — M545 Low back pain, unspecified: Secondary | ICD-10-CM | POA: Diagnosis not present

## 2013-02-09 DIAGNOSIS — G8929 Other chronic pain: Secondary | ICD-10-CM | POA: Diagnosis not present

## 2013-02-09 DIAGNOSIS — I10 Essential (primary) hypertension: Secondary | ICD-10-CM | POA: Diagnosis not present

## 2013-02-09 DIAGNOSIS — R52 Pain, unspecified: Secondary | ICD-10-CM | POA: Diagnosis not present

## 2013-02-09 DIAGNOSIS — M542 Cervicalgia: Secondary | ICD-10-CM | POA: Diagnosis not present

## 2013-02-09 DIAGNOSIS — M549 Dorsalgia, unspecified: Secondary | ICD-10-CM | POA: Diagnosis not present

## 2013-02-09 DIAGNOSIS — E119 Type 2 diabetes mellitus without complications: Secondary | ICD-10-CM | POA: Diagnosis not present

## 2013-02-14 DIAGNOSIS — R29898 Other symptoms and signs involving the musculoskeletal system: Secondary | ICD-10-CM | POA: Diagnosis not present

## 2013-02-16 DIAGNOSIS — E119 Type 2 diabetes mellitus without complications: Secondary | ICD-10-CM | POA: Diagnosis not present

## 2013-02-16 DIAGNOSIS — M47812 Spondylosis without myelopathy or radiculopathy, cervical region: Secondary | ICD-10-CM | POA: Diagnosis not present

## 2013-02-16 DIAGNOSIS — I2699 Other pulmonary embolism without acute cor pulmonale: Secondary | ICD-10-CM | POA: Diagnosis not present

## 2013-02-16 DIAGNOSIS — K921 Melena: Secondary | ICD-10-CM | POA: Diagnosis not present

## 2013-02-17 DIAGNOSIS — M79609 Pain in unspecified limb: Secondary | ICD-10-CM | POA: Diagnosis not present

## 2013-02-17 DIAGNOSIS — R29898 Other symptoms and signs involving the musculoskeletal system: Secondary | ICD-10-CM | POA: Diagnosis not present

## 2013-02-21 DIAGNOSIS — K921 Melena: Secondary | ICD-10-CM | POA: Diagnosis not present

## 2013-02-21 DIAGNOSIS — K648 Other hemorrhoids: Secondary | ICD-10-CM | POA: Diagnosis not present

## 2013-02-21 DIAGNOSIS — I10 Essential (primary) hypertension: Secondary | ICD-10-CM | POA: Diagnosis not present

## 2013-02-21 DIAGNOSIS — E119 Type 2 diabetes mellitus without complications: Secondary | ICD-10-CM | POA: Diagnosis not present

## 2013-03-13 DIAGNOSIS — K089 Disorder of teeth and supporting structures, unspecified: Secondary | ICD-10-CM | POA: Diagnosis not present

## 2013-03-13 DIAGNOSIS — K029 Dental caries, unspecified: Secondary | ICD-10-CM | POA: Diagnosis not present

## 2013-03-14 DIAGNOSIS — K089 Disorder of teeth and supporting structures, unspecified: Secondary | ICD-10-CM | POA: Diagnosis not present

## 2013-03-14 DIAGNOSIS — E119 Type 2 diabetes mellitus without complications: Secondary | ICD-10-CM | POA: Diagnosis not present

## 2013-03-14 DIAGNOSIS — M129 Arthropathy, unspecified: Secondary | ICD-10-CM | POA: Diagnosis not present

## 2013-03-18 DIAGNOSIS — E119 Type 2 diabetes mellitus without complications: Secondary | ICD-10-CM | POA: Diagnosis not present

## 2013-03-18 DIAGNOSIS — M79609 Pain in unspecified limb: Secondary | ICD-10-CM | POA: Diagnosis not present

## 2013-03-18 DIAGNOSIS — I10 Essential (primary) hypertension: Secondary | ICD-10-CM | POA: Diagnosis not present

## 2013-03-29 DIAGNOSIS — M4802 Spinal stenosis, cervical region: Secondary | ICD-10-CM | POA: Diagnosis not present

## 2013-03-29 DIAGNOSIS — I10 Essential (primary) hypertension: Secondary | ICD-10-CM | POA: Diagnosis not present

## 2013-04-17 DIAGNOSIS — R42 Dizziness and giddiness: Secondary | ICD-10-CM | POA: Diagnosis not present

## 2013-04-17 DIAGNOSIS — S0990XA Unspecified injury of head, initial encounter: Secondary | ICD-10-CM | POA: Diagnosis not present

## 2013-04-17 DIAGNOSIS — S1093XA Contusion of unspecified part of neck, initial encounter: Secondary | ICD-10-CM | POA: Diagnosis not present

## 2013-04-17 DIAGNOSIS — S0003XA Contusion of scalp, initial encounter: Secondary | ICD-10-CM | POA: Diagnosis not present

## 2013-04-29 DIAGNOSIS — E119 Type 2 diabetes mellitus without complications: Secondary | ICD-10-CM | POA: Diagnosis not present

## 2013-04-29 DIAGNOSIS — Z86711 Personal history of pulmonary embolism: Secondary | ICD-10-CM | POA: Diagnosis not present

## 2013-04-29 DIAGNOSIS — M1710 Unilateral primary osteoarthritis, unspecified knee: Secondary | ICD-10-CM | POA: Diagnosis not present

## 2013-04-29 DIAGNOSIS — Z888 Allergy status to other drugs, medicaments and biological substances status: Secondary | ICD-10-CM | POA: Diagnosis not present

## 2013-04-29 DIAGNOSIS — Z881 Allergy status to other antibiotic agents status: Secondary | ICD-10-CM | POA: Diagnosis not present

## 2013-04-29 DIAGNOSIS — M25569 Pain in unspecified knee: Secondary | ICD-10-CM | POA: Diagnosis not present

## 2013-04-29 DIAGNOSIS — I10 Essential (primary) hypertension: Secondary | ICD-10-CM | POA: Diagnosis not present

## 2013-04-29 DIAGNOSIS — M129 Arthropathy, unspecified: Secondary | ICD-10-CM | POA: Diagnosis not present

## 2013-04-29 DIAGNOSIS — Z86718 Personal history of other venous thrombosis and embolism: Secondary | ICD-10-CM | POA: Diagnosis not present

## 2013-04-29 DIAGNOSIS — Z880 Allergy status to penicillin: Secondary | ICD-10-CM | POA: Diagnosis not present

## 2013-04-29 DIAGNOSIS — M79609 Pain in unspecified limb: Secondary | ICD-10-CM | POA: Diagnosis not present

## 2013-04-29 DIAGNOSIS — Z885 Allergy status to narcotic agent status: Secondary | ICD-10-CM | POA: Diagnosis not present

## 2013-04-30 DIAGNOSIS — R609 Edema, unspecified: Secondary | ICD-10-CM | POA: Diagnosis not present

## 2013-04-30 DIAGNOSIS — M79609 Pain in unspecified limb: Secondary | ICD-10-CM | POA: Diagnosis not present

## 2013-04-30 DIAGNOSIS — M7989 Other specified soft tissue disorders: Secondary | ICD-10-CM | POA: Diagnosis not present

## 2013-05-02 DIAGNOSIS — M169 Osteoarthritis of hip, unspecified: Secondary | ICD-10-CM | POA: Diagnosis not present

## 2013-05-02 DIAGNOSIS — M1710 Unilateral primary osteoarthritis, unspecified knee: Secondary | ICD-10-CM | POA: Diagnosis not present

## 2013-05-02 DIAGNOSIS — M79609 Pain in unspecified limb: Secondary | ICD-10-CM | POA: Diagnosis not present

## 2013-05-02 DIAGNOSIS — Z881 Allergy status to other antibiotic agents status: Secondary | ICD-10-CM | POA: Diagnosis not present

## 2013-05-02 DIAGNOSIS — Z86718 Personal history of other venous thrombosis and embolism: Secondary | ICD-10-CM | POA: Diagnosis not present

## 2013-05-02 DIAGNOSIS — Z880 Allergy status to penicillin: Secondary | ICD-10-CM | POA: Diagnosis not present

## 2013-05-02 DIAGNOSIS — E119 Type 2 diabetes mellitus without complications: Secondary | ICD-10-CM | POA: Diagnosis not present

## 2013-05-02 DIAGNOSIS — Z79899 Other long term (current) drug therapy: Secondary | ICD-10-CM | POA: Diagnosis not present

## 2013-05-02 DIAGNOSIS — Z888 Allergy status to other drugs, medicaments and biological substances status: Secondary | ICD-10-CM | POA: Diagnosis not present

## 2013-05-02 DIAGNOSIS — M4800 Spinal stenosis, site unspecified: Secondary | ICD-10-CM | POA: Diagnosis not present

## 2013-05-02 DIAGNOSIS — Z885 Allergy status to narcotic agent status: Secondary | ICD-10-CM | POA: Diagnosis not present

## 2013-05-02 DIAGNOSIS — I10 Essential (primary) hypertension: Secondary | ICD-10-CM | POA: Diagnosis not present

## 2013-05-02 DIAGNOSIS — IMO0002 Reserved for concepts with insufficient information to code with codable children: Secondary | ICD-10-CM | POA: Diagnosis not present

## 2013-05-02 DIAGNOSIS — M1610 Unilateral primary osteoarthritis, unspecified hip: Secondary | ICD-10-CM | POA: Diagnosis not present

## 2013-05-21 DIAGNOSIS — M4800 Spinal stenosis, site unspecified: Secondary | ICD-10-CM | POA: Diagnosis not present

## 2013-05-21 DIAGNOSIS — Z79899 Other long term (current) drug therapy: Secondary | ICD-10-CM | POA: Diagnosis not present

## 2013-05-21 DIAGNOSIS — K029 Dental caries, unspecified: Secondary | ICD-10-CM | POA: Diagnosis not present

## 2013-05-21 DIAGNOSIS — K089 Disorder of teeth and supporting structures, unspecified: Secondary | ICD-10-CM | POA: Diagnosis not present

## 2013-05-21 DIAGNOSIS — M479 Spondylosis, unspecified: Secondary | ICD-10-CM | POA: Diagnosis not present

## 2013-06-12 DIAGNOSIS — Z86718 Personal history of other venous thrombosis and embolism: Secondary | ICD-10-CM | POA: Diagnosis not present

## 2013-06-12 DIAGNOSIS — I10 Essential (primary) hypertension: Secondary | ICD-10-CM | POA: Diagnosis not present

## 2013-06-12 DIAGNOSIS — Z86711 Personal history of pulmonary embolism: Secondary | ICD-10-CM | POA: Diagnosis not present

## 2013-06-12 DIAGNOSIS — M5030 Other cervical disc degeneration, unspecified cervical region: Secondary | ICD-10-CM | POA: Diagnosis not present

## 2013-06-12 DIAGNOSIS — K0510 Chronic gingivitis, plaque induced: Secondary | ICD-10-CM | POA: Diagnosis not present

## 2013-06-12 DIAGNOSIS — E119 Type 2 diabetes mellitus without complications: Secondary | ICD-10-CM | POA: Diagnosis not present

## 2013-06-12 DIAGNOSIS — K029 Dental caries, unspecified: Secondary | ICD-10-CM | POA: Diagnosis not present

## 2013-06-14 DIAGNOSIS — K089 Disorder of teeth and supporting structures, unspecified: Secondary | ICD-10-CM | POA: Diagnosis not present

## 2013-06-16 DIAGNOSIS — E119 Type 2 diabetes mellitus without complications: Secondary | ICD-10-CM | POA: Diagnosis not present

## 2013-06-16 DIAGNOSIS — I10 Essential (primary) hypertension: Secondary | ICD-10-CM | POA: Diagnosis not present

## 2013-06-16 DIAGNOSIS — K029 Dental caries, unspecified: Secondary | ICD-10-CM | POA: Diagnosis not present

## 2013-06-21 DIAGNOSIS — I10 Essential (primary) hypertension: Secondary | ICD-10-CM | POA: Diagnosis not present

## 2013-06-21 DIAGNOSIS — Z719 Counseling, unspecified: Secondary | ICD-10-CM | POA: Diagnosis not present

## 2013-06-21 DIAGNOSIS — Z1331 Encounter for screening for depression: Secondary | ICD-10-CM | POA: Diagnosis not present

## 2013-06-21 DIAGNOSIS — M549 Dorsalgia, unspecified: Secondary | ICD-10-CM | POA: Diagnosis not present

## 2013-06-21 DIAGNOSIS — R7309 Other abnormal glucose: Secondary | ICD-10-CM | POA: Diagnosis not present

## 2013-07-08 DIAGNOSIS — Z886 Allergy status to analgesic agent status: Secondary | ICD-10-CM | POA: Diagnosis not present

## 2013-07-08 DIAGNOSIS — M25559 Pain in unspecified hip: Secondary | ICD-10-CM | POA: Diagnosis present

## 2013-07-08 DIAGNOSIS — Z888 Allergy status to other drugs, medicaments and biological substances status: Secondary | ICD-10-CM | POA: Diagnosis not present

## 2013-07-08 DIAGNOSIS — F329 Major depressive disorder, single episode, unspecified: Secondary | ICD-10-CM | POA: Diagnosis not present

## 2013-07-08 DIAGNOSIS — Z86718 Personal history of other venous thrombosis and embolism: Secondary | ICD-10-CM | POA: Diagnosis not present

## 2013-07-08 DIAGNOSIS — Z86711 Personal history of pulmonary embolism: Secondary | ICD-10-CM | POA: Diagnosis not present

## 2013-07-08 DIAGNOSIS — F332 Major depressive disorder, recurrent severe without psychotic features: Secondary | ICD-10-CM | POA: Diagnosis present

## 2013-07-08 DIAGNOSIS — M25569 Pain in unspecified knee: Secondary | ICD-10-CM | POA: Diagnosis present

## 2013-07-08 DIAGNOSIS — Z880 Allergy status to penicillin: Secondary | ICD-10-CM | POA: Diagnosis not present

## 2013-07-08 DIAGNOSIS — F603 Borderline personality disorder: Secondary | ICD-10-CM | POA: Diagnosis present

## 2013-07-08 DIAGNOSIS — M47817 Spondylosis without myelopathy or radiculopathy, lumbosacral region: Secondary | ICD-10-CM | POA: Diagnosis present

## 2013-07-08 DIAGNOSIS — R45851 Suicidal ideations: Secondary | ICD-10-CM | POA: Diagnosis not present

## 2013-07-08 DIAGNOSIS — I10 Essential (primary) hypertension: Secondary | ICD-10-CM | POA: Diagnosis present

## 2013-07-08 DIAGNOSIS — E119 Type 2 diabetes mellitus without complications: Secondary | ICD-10-CM | POA: Diagnosis present

## 2013-07-08 DIAGNOSIS — F3289 Other specified depressive episodes: Secondary | ICD-10-CM | POA: Diagnosis not present

## 2013-07-19 DIAGNOSIS — E119 Type 2 diabetes mellitus without complications: Secondary | ICD-10-CM | POA: Diagnosis not present

## 2013-07-19 DIAGNOSIS — M169 Osteoarthritis of hip, unspecified: Secondary | ICD-10-CM | POA: Diagnosis not present

## 2013-07-19 DIAGNOSIS — M1610 Unilateral primary osteoarthritis, unspecified hip: Secondary | ICD-10-CM | POA: Diagnosis not present

## 2013-07-19 DIAGNOSIS — M25559 Pain in unspecified hip: Secondary | ICD-10-CM | POA: Diagnosis not present

## 2013-07-19 DIAGNOSIS — E039 Hypothyroidism, unspecified: Secondary | ICD-10-CM | POA: Diagnosis not present

## 2013-07-19 DIAGNOSIS — Z86711 Personal history of pulmonary embolism: Secondary | ICD-10-CM | POA: Diagnosis not present

## 2013-07-25 DIAGNOSIS — Z981 Arthrodesis status: Secondary | ICD-10-CM | POA: Diagnosis not present

## 2013-07-25 DIAGNOSIS — E785 Hyperlipidemia, unspecified: Secondary | ICD-10-CM | POA: Diagnosis not present

## 2013-07-25 DIAGNOSIS — IMO0002 Reserved for concepts with insufficient information to code with codable children: Secondary | ICD-10-CM | POA: Diagnosis not present

## 2013-07-25 DIAGNOSIS — I739 Peripheral vascular disease, unspecified: Secondary | ICD-10-CM | POA: Diagnosis not present

## 2013-07-25 DIAGNOSIS — R079 Chest pain, unspecified: Secondary | ICD-10-CM | POA: Diagnosis not present

## 2013-07-25 DIAGNOSIS — I10 Essential (primary) hypertension: Secondary | ICD-10-CM | POA: Diagnosis not present

## 2013-07-25 DIAGNOSIS — R0789 Other chest pain: Secondary | ICD-10-CM | POA: Diagnosis not present

## 2013-07-25 DIAGNOSIS — I2510 Atherosclerotic heart disease of native coronary artery without angina pectoris: Secondary | ICD-10-CM | POA: Diagnosis not present

## 2013-07-25 DIAGNOSIS — F329 Major depressive disorder, single episode, unspecified: Secondary | ICD-10-CM | POA: Diagnosis not present

## 2013-07-25 DIAGNOSIS — R12 Heartburn: Secondary | ICD-10-CM | POA: Diagnosis not present

## 2013-07-25 DIAGNOSIS — R52 Pain, unspecified: Secondary | ICD-10-CM | POA: Diagnosis not present

## 2013-07-25 DIAGNOSIS — D649 Anemia, unspecified: Secondary | ICD-10-CM | POA: Diagnosis not present

## 2013-07-25 DIAGNOSIS — F3289 Other specified depressive episodes: Secondary | ICD-10-CM | POA: Diagnosis not present

## 2013-07-25 DIAGNOSIS — E119 Type 2 diabetes mellitus without complications: Secondary | ICD-10-CM | POA: Diagnosis not present

## 2013-07-25 DIAGNOSIS — Z87891 Personal history of nicotine dependence: Secondary | ICD-10-CM | POA: Diagnosis not present

## 2013-07-25 DIAGNOSIS — M47812 Spondylosis without myelopathy or radiculopathy, cervical region: Secondary | ICD-10-CM | POA: Diagnosis not present

## 2013-07-25 DIAGNOSIS — M25519 Pain in unspecified shoulder: Secondary | ICD-10-CM | POA: Diagnosis not present

## 2013-07-25 DIAGNOSIS — M79609 Pain in unspecified limb: Secondary | ICD-10-CM | POA: Diagnosis not present

## 2013-07-25 DIAGNOSIS — E039 Hypothyroidism, unspecified: Secondary | ICD-10-CM | POA: Diagnosis not present

## 2013-07-25 DIAGNOSIS — I498 Other specified cardiac arrhythmias: Secondary | ICD-10-CM | POA: Diagnosis not present

## 2013-07-26 DIAGNOSIS — I82409 Acute embolism and thrombosis of unspecified deep veins of unspecified lower extremity: Secondary | ICD-10-CM | POA: Diagnosis not present

## 2013-07-26 DIAGNOSIS — I10 Essential (primary) hypertension: Secondary | ICD-10-CM | POA: Diagnosis not present

## 2013-07-26 DIAGNOSIS — R0789 Other chest pain: Secondary | ICD-10-CM | POA: Diagnosis not present

## 2013-07-26 DIAGNOSIS — F329 Major depressive disorder, single episode, unspecified: Secondary | ICD-10-CM | POA: Diagnosis not present

## 2013-07-26 DIAGNOSIS — M25519 Pain in unspecified shoulder: Secondary | ICD-10-CM | POA: Diagnosis not present

## 2013-07-26 DIAGNOSIS — R609 Edema, unspecified: Secondary | ICD-10-CM | POA: Diagnosis not present

## 2013-07-26 DIAGNOSIS — M47812 Spondylosis without myelopathy or radiculopathy, cervical region: Secondary | ICD-10-CM | POA: Diagnosis not present

## 2013-07-26 DIAGNOSIS — M5030 Other cervical disc degeneration, unspecified cervical region: Secondary | ICD-10-CM | POA: Diagnosis not present

## 2013-07-26 DIAGNOSIS — F3289 Other specified depressive episodes: Secondary | ICD-10-CM | POA: Diagnosis not present

## 2013-08-07 DIAGNOSIS — K3189 Other diseases of stomach and duodenum: Secondary | ICD-10-CM | POA: Diagnosis not present

## 2013-08-07 DIAGNOSIS — Z833 Family history of diabetes mellitus: Secondary | ICD-10-CM | POA: Diagnosis not present

## 2013-08-07 DIAGNOSIS — A09 Infectious gastroenteritis and colitis, unspecified: Secondary | ICD-10-CM | POA: Diagnosis not present

## 2013-08-07 DIAGNOSIS — Z90710 Acquired absence of both cervix and uterus: Secondary | ICD-10-CM | POA: Diagnosis not present

## 2013-08-07 DIAGNOSIS — Z7982 Long term (current) use of aspirin: Secondary | ICD-10-CM | POA: Diagnosis not present

## 2013-08-07 DIAGNOSIS — Z9089 Acquired absence of other organs: Secondary | ICD-10-CM | POA: Diagnosis not present

## 2013-08-07 DIAGNOSIS — K2970 Gastritis, unspecified, without bleeding: Secondary | ICD-10-CM | POA: Diagnosis not present

## 2013-08-07 DIAGNOSIS — R197 Diarrhea, unspecified: Secondary | ICD-10-CM | POA: Diagnosis not present

## 2013-08-07 DIAGNOSIS — E119 Type 2 diabetes mellitus without complications: Secondary | ICD-10-CM | POA: Diagnosis not present

## 2013-08-07 DIAGNOSIS — K560 Paralytic ileus: Secondary | ICD-10-CM | POA: Diagnosis not present

## 2013-08-07 DIAGNOSIS — Z880 Allergy status to penicillin: Secondary | ICD-10-CM | POA: Diagnosis not present

## 2013-08-07 DIAGNOSIS — K5289 Other specified noninfective gastroenteritis and colitis: Secondary | ICD-10-CM | POA: Diagnosis not present

## 2013-08-07 DIAGNOSIS — I519 Heart disease, unspecified: Secondary | ICD-10-CM | POA: Diagnosis not present

## 2013-08-07 DIAGNOSIS — R1032 Left lower quadrant pain: Secondary | ICD-10-CM | POA: Diagnosis not present

## 2013-08-07 DIAGNOSIS — M1990 Unspecified osteoarthritis, unspecified site: Secondary | ICD-10-CM | POA: Diagnosis not present

## 2013-08-07 DIAGNOSIS — Z803 Family history of malignant neoplasm of breast: Secondary | ICD-10-CM | POA: Diagnosis not present

## 2013-08-07 DIAGNOSIS — Z885 Allergy status to narcotic agent status: Secondary | ICD-10-CM | POA: Diagnosis not present

## 2013-08-07 DIAGNOSIS — R112 Nausea with vomiting, unspecified: Secondary | ICD-10-CM | POA: Diagnosis not present

## 2013-08-07 DIAGNOSIS — G8929 Other chronic pain: Secondary | ICD-10-CM | POA: Diagnosis not present

## 2013-08-07 DIAGNOSIS — A088 Other specified intestinal infections: Secondary | ICD-10-CM | POA: Diagnosis not present

## 2013-08-07 DIAGNOSIS — D72829 Elevated white blood cell count, unspecified: Secondary | ICD-10-CM | POA: Diagnosis not present

## 2013-08-07 DIAGNOSIS — I10 Essential (primary) hypertension: Secondary | ICD-10-CM | POA: Diagnosis not present

## 2013-08-07 DIAGNOSIS — M4800 Spinal stenosis, site unspecified: Secondary | ICD-10-CM | POA: Diagnosis not present

## 2013-08-17 DIAGNOSIS — J069 Acute upper respiratory infection, unspecified: Secondary | ICD-10-CM | POA: Diagnosis not present

## 2013-08-17 DIAGNOSIS — R109 Unspecified abdominal pain: Secondary | ICD-10-CM | POA: Diagnosis not present

## 2013-08-17 DIAGNOSIS — K5900 Constipation, unspecified: Secondary | ICD-10-CM | POA: Diagnosis not present

## 2013-08-18 DIAGNOSIS — J069 Acute upper respiratory infection, unspecified: Secondary | ICD-10-CM | POA: Diagnosis not present

## 2013-08-18 DIAGNOSIS — N390 Urinary tract infection, site not specified: Secondary | ICD-10-CM | POA: Diagnosis not present

## 2013-08-18 DIAGNOSIS — Z90710 Acquired absence of both cervix and uterus: Secondary | ICD-10-CM | POA: Diagnosis not present

## 2013-08-18 DIAGNOSIS — I252 Old myocardial infarction: Secondary | ICD-10-CM | POA: Diagnosis not present

## 2013-08-18 DIAGNOSIS — R109 Unspecified abdominal pain: Secondary | ICD-10-CM | POA: Diagnosis not present

## 2013-08-18 DIAGNOSIS — I959 Hypotension, unspecified: Secondary | ICD-10-CM | POA: Diagnosis not present

## 2013-08-18 DIAGNOSIS — B9789 Other viral agents as the cause of diseases classified elsewhere: Secondary | ICD-10-CM | POA: Diagnosis not present

## 2013-08-18 DIAGNOSIS — K5900 Constipation, unspecified: Secondary | ICD-10-CM | POA: Diagnosis not present

## 2013-08-18 DIAGNOSIS — Z9089 Acquired absence of other organs: Secondary | ICD-10-CM | POA: Diagnosis not present

## 2013-08-21 DIAGNOSIS — I252 Old myocardial infarction: Secondary | ICD-10-CM | POA: Diagnosis not present

## 2013-08-21 DIAGNOSIS — Z5900 Homelessness unspecified: Secondary | ICD-10-CM | POA: Diagnosis not present

## 2013-08-21 DIAGNOSIS — G4733 Obstructive sleep apnea (adult) (pediatric): Secondary | ICD-10-CM | POA: Diagnosis not present

## 2013-08-21 DIAGNOSIS — R112 Nausea with vomiting, unspecified: Secondary | ICD-10-CM | POA: Diagnosis not present

## 2013-08-21 DIAGNOSIS — I10 Essential (primary) hypertension: Secondary | ICD-10-CM | POA: Diagnosis not present

## 2013-08-22 DIAGNOSIS — M25569 Pain in unspecified knee: Secondary | ICD-10-CM | POA: Diagnosis not present

## 2013-08-22 DIAGNOSIS — R1084 Generalized abdominal pain: Secondary | ICD-10-CM | POA: Diagnosis not present

## 2013-08-22 DIAGNOSIS — K589 Irritable bowel syndrome without diarrhea: Secondary | ICD-10-CM | POA: Diagnosis not present

## 2013-08-22 DIAGNOSIS — K5900 Constipation, unspecified: Secondary | ICD-10-CM | POA: Diagnosis not present

## 2013-08-24 DIAGNOSIS — M25569 Pain in unspecified knee: Secondary | ICD-10-CM | POA: Diagnosis not present

## 2013-08-24 DIAGNOSIS — K5900 Constipation, unspecified: Secondary | ICD-10-CM | POA: Diagnosis not present

## 2013-08-24 DIAGNOSIS — R1084 Generalized abdominal pain: Secondary | ICD-10-CM | POA: Diagnosis not present

## 2013-08-24 DIAGNOSIS — J069 Acute upper respiratory infection, unspecified: Secondary | ICD-10-CM | POA: Diagnosis not present

## 2013-09-20 ENCOUNTER — Encounter (HOSPITAL_COMMUNITY): Payer: Self-pay | Admitting: Emergency Medicine

## 2013-09-20 ENCOUNTER — Emergency Department (INDEPENDENT_AMBULATORY_CARE_PROVIDER_SITE_OTHER)
Admission: EM | Admit: 2013-09-20 | Discharge: 2013-09-20 | Disposition: A | Discharge status: Home / Self Care | Payer: Medicare Other | Source: Home / Self Care

## 2013-09-20 DIAGNOSIS — G8929 Other chronic pain: Secondary | ICD-10-CM | POA: Diagnosis not present

## 2013-09-20 MED ORDER — DICLOFENAC SODIUM 1 % TD GEL: 2.0000 g | Freq: Four times a day (QID) | TRANSDERMAL | Status: DC
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
## 2013-09-20 NOTE — ED Notes (Signed)
Patient complains of pain all over Needs to have left hip and left knee replaced Has pain to right upper arm Having lower left leg pain  Has not been seen by a specialist

## 2013-09-24 ENCOUNTER — Emergency Department (HOSPITAL_COMMUNITY)
Admission: EM | Admit: 2013-09-24 | Discharge: 2013-09-24 | Disposition: A | Discharge status: Home / Self Care | Payer: Medicare Other | Attending: Emergency Medicine | Admitting: Emergency Medicine

## 2013-09-24 ENCOUNTER — Emergency Department (HOSPITAL_COMMUNITY): Payer: Medicare Other

## 2013-09-24 ENCOUNTER — Encounter (HOSPITAL_COMMUNITY): Payer: Self-pay | Admitting: Emergency Medicine

## 2013-09-24 DIAGNOSIS — M25559 Pain in unspecified hip: Secondary | ICD-10-CM | POA: Insufficient documentation

## 2013-09-24 DIAGNOSIS — M79609 Pain in unspecified limb: Secondary | ICD-10-CM | POA: Insufficient documentation

## 2013-09-24 DIAGNOSIS — E119 Type 2 diabetes mellitus without complications: Secondary | ICD-10-CM | POA: Diagnosis not present

## 2013-09-24 DIAGNOSIS — G8929 Other chronic pain: Secondary | ICD-10-CM | POA: Diagnosis not present

## 2013-09-24 DIAGNOSIS — M25562 Pain in left knee: Secondary | ICD-10-CM

## 2013-09-24 DIAGNOSIS — R079 Chest pain, unspecified: Secondary | ICD-10-CM

## 2013-09-24 DIAGNOSIS — M542 Cervicalgia: Secondary | ICD-10-CM | POA: Diagnosis not present

## 2013-09-24 DIAGNOSIS — M25551 Pain in right hip: Secondary | ICD-10-CM

## 2013-09-24 DIAGNOSIS — I10 Essential (primary) hypertension: Secondary | ICD-10-CM | POA: Insufficient documentation

## 2013-09-24 DIAGNOSIS — M7989 Other specified soft tissue disorders: Secondary | ICD-10-CM | POA: Insufficient documentation

## 2013-09-24 DIAGNOSIS — Z86711 Personal history of pulmonary embolism: Secondary | ICD-10-CM | POA: Insufficient documentation

## 2013-09-24 DIAGNOSIS — R072 Precordial pain: Secondary | ICD-10-CM | POA: Insufficient documentation

## 2013-09-24 DIAGNOSIS — Z791 Long term (current) use of non-steroidal anti-inflammatories (NSAID): Secondary | ICD-10-CM | POA: Diagnosis not present

## 2013-09-24 DIAGNOSIS — M25569 Pain in unspecified knee: Secondary | ICD-10-CM | POA: Diagnosis not present

## 2013-09-24 DIAGNOSIS — I2510 Atherosclerotic heart disease of native coronary artery without angina pectoris: Secondary | ICD-10-CM | POA: Diagnosis not present

## 2013-09-24 DIAGNOSIS — Z86718 Personal history of other venous thrombosis and embolism: Secondary | ICD-10-CM | POA: Insufficient documentation

## 2013-09-24 DIAGNOSIS — M129 Arthropathy, unspecified: Secondary | ICD-10-CM | POA: Diagnosis not present

## 2013-09-24 DIAGNOSIS — Z79899 Other long term (current) drug therapy: Secondary | ICD-10-CM | POA: Insufficient documentation

## 2013-09-24 DIAGNOSIS — R059 Cough, unspecified: Secondary | ICD-10-CM | POA: Diagnosis not present

## 2013-09-24 DIAGNOSIS — M25552 Pain in left hip: Secondary | ICD-10-CM

## 2013-09-24 DIAGNOSIS — M25561 Pain in right knee: Secondary | ICD-10-CM

## 2013-09-24 DIAGNOSIS — Z880 Allergy status to penicillin: Secondary | ICD-10-CM | POA: Insufficient documentation

## 2013-09-24 DIAGNOSIS — R05 Cough: Secondary | ICD-10-CM | POA: Insufficient documentation

## 2013-09-24 HISTORY — DX: Spondylosis, unspecified: M47.9

## 2013-09-24 HISTORY — DX: Spinal stenosis, site unspecified: M48.00

## 2013-09-24 LAB — BASIC METABOLIC PANEL
BUN: 12 mg/dL (ref 6–23)
CO2: 28 mEq/L (ref 19–32)
Calcium: 9.3 mg/dL (ref 8.4–10.5)
Chloride: 104 mEq/L (ref 96–112)
Creatinine, Ser: 0.74 mg/dL (ref 0.50–1.10)
GFR calc Af Amer: >90 mL/min (ref >90)
GFR calc non Af Amer: >90 mL/min (ref >90)
Glucose, Bld: 86 mg/dL (ref 70–99)
Potassium: 4.6 mEq/L (ref 3.7–5.3)
Sodium: 143 mEq/L (ref 137–147)

## 2013-09-24 LAB — CBC
HCT: 29.9 % — ABNORMAL LOW (ref 36.0–46.0)
Hemoglobin: 10.2 g/dL — ABNORMAL LOW (ref 12.0–15.0)
MCH: 30 pg (ref 26.0–34.0)
MCHC: 34.1 g/dL (ref 30.0–36.0)
MCV: 87.9 fL (ref 78.0–100.0)
Platelets: 370 10*3/uL (ref 150–400)
RBC: 3.4 MIL/uL — ABNORMAL LOW (ref 3.87–5.11)
RDW: 13 % (ref 11.5–15.5)
WBC: 7 10*3/uL (ref 4.0–10.5)

## 2013-09-24 LAB — POCT I-STAT TROPONIN I
Troponin i, poc: 0 ng/mL (ref 0.00–0.08)
Troponin i, poc: 0 ng/mL (ref 0.00–0.08)

## 2013-09-24 MED ORDER — HYDROMORPHONE HCL PF 1 MG/ML IJ SOLN
1.0000 mg | Freq: Once | INTRAMUSCULAR | Status: AC
  Administered 2013-09-24: 1 mg via INTRAVENOUS
  Filled 2013-09-24: qty 1

## 2013-09-24 NOTE — ED Notes (Signed)
Patient returned from Ultrasound. 

## 2013-09-24 NOTE — ED Notes (Signed)
Patient transported to Doppler

## 2013-09-24 NOTE — Progress Notes (Signed)
VASCULAR LAB PRELIMINARY  PRELIMINARY  PRELIMINARY  PRELIMINARY  Bilateral lower extremity venous duplex completed.    Preliminary report: Bilateral:  No evidence of DVT, superficial thrombosis, or Baker's Cyst.   Jetta Murray, RVS 09/24/2013, 6:27 PM

## 2013-09-24 NOTE — ED Provider Notes (Signed)
CSN: [id number]     Arrival date & time 09/24/13  1509 History   First MD Initiated Contact with Patient 09/24/13 1533     Chief Complaint  Patient presents with  . Chest Pain    HPI: Valerie Vazquez is a 58 yo F with history of chronic pain, DVT, PE, CAD, HTN and DM who presents with chest pain, bilateral leg and right arm pain. Extremity pain is a chronic issue for her, she tells me she needs to have a knee and hip replacement. Four days ago she started to have substernal chest pain that she describes as tightness, non-radiating, not pleuritic or exertional in nature. She has not taken anything for the pain at home. Pain lasts for 5-7 minutes, then resolves spontaneously, associated with nausea but no vomiting. She has pain 3-4 times per day, but only once todayShe does not have SOB or chest pain with exercise. She also endorses L leg swelling for several days. No recent trauma. She has recently located to our area and does not have a PCP appointment for several weeks. Her last heart cath was in 2012, showed 20% blockage but she does not recall where. Last PE was in 2014 following neck surgery.    Past Medical History  Diagnosis Date  . Arthritis   . Spinal stenosis   . Spondylosis    History reviewed. No pertinent past surgical history. History reviewed. No pertinent family history. History  Substance Use Topics  . Smoking status: Never Smoker   . Smokeless tobacco: Not on file  . Alcohol Use: No   OB History   Grav Para Term Preterm Abortions TAB SAB Ect Mult Living                 Review of Systems  Constitutional: Negative for fever, chills, appetite change and fatigue.  Eyes: Negative for photophobia and visual disturbance.  Respiratory: Positive for cough (improving, attributed to ace inhibitor). Negative for shortness of breath.   Cardiovascular: Positive for chest pain and leg swelling (Left).  Gastrointestinal: Positive for nausea. Negative for vomiting, abdominal pain,  diarrhea and constipation.  Genitourinary: Negative for dysuria, frequency and decreased urine volume.  Musculoskeletal: Positive for arthralgias, myalgias and neck pain (chronic). Negative for back pain and gait problem.  Skin: Negative for color change and wound.  Neurological: Negative for dizziness, syncope, light-headedness and headaches.  Psychiatric/Behavioral: Negative for confusion and agitation.  All other systems reviewed and are negative.    Allergies  Abilify; Augmentin; Bactrim; Ceftin; Indocin; Lisinopril; Morphine and related; Penicillins; Percocet; and Wellbutrin  Home Medications   Current Outpatient Rx  Name  Route  Sig  Dispense  Refill  . acetaminophen-codeine (TYLENOL #3) 300-30 MG per tablet   Oral   Take 1 tablet by mouth every 4 (four) hours as needed for moderate pain.         . Aspirin-Caffeine (BAYER BACK & BODY PAIN EX ST) 500-32.5 MG TABS   Oral   Take 1 tablet by mouth every 6 (six) hours as needed (pain).         . cyclobenzaprine (FLEXERIL) 5 MG tablet   Oral   Take 5 mg by mouth 3 (three) times daily as needed for muscle spasms.         Marland Kitchen dicyclomine (BENTYL) 10 MG capsule   Oral   Take 10 mg by mouth 4 (four) times daily -  before meals and at bedtime.         Marland Kitchen  diphenhydrAMINE (SOMINEX) 25 MG tablet   Oral   Take 25 mg by mouth at bedtime as needed for sleep.         Marland Kitchen gabapentin (NEURONTIN) 600 MG tablet   Oral   Take 600 mg by mouth 3 (three) times daily.         Marland Kitchen lidocaine (LIDODERM) 5 %   Transdermal   Place 1 patch onto the skin daily. Remove & Discard patch within 12 hours or as directed by MD         . naproxen sodium (ANAPROX) 220 MG tablet   Oral   Take 220 mg by mouth 2 (two) times daily with a meal.         . trolamine salicylate (ASPERCREME) 10 % cream   Topical   Apply 1 application topically as needed for muscle pain.          BP 147/78  Pulse 85  Temp(Src) 98.1 F (36.7 C) (Oral)  Resp 16   SpO2 97% Physical Exam  Nursing note and vitals reviewed. Constitutional: She is oriented to person, place, and time. She appears well-developed and well-nourished. No distress.  Middle age female, laying in bed, holding her left knee.  HENT:  Head: Normocephalic and atraumatic.  Mouth/Throat: Oropharynx is clear and moist.  Eyes: Conjunctivae and EOM are normal. Pupils are equal, round, and reactive to light.  Neck: Normal range of motion. Neck supple.  Cardiovascular: Normal rate, regular rhythm, normal heart sounds and intact distal pulses.   Pulmonary/Chest: Effort normal and breath sounds normal. No respiratory distress.  Abdominal: Soft. Bowel sounds are normal. There is no tenderness. There is no rebound and no guarding.  Musculoskeletal: Normal range of motion. She exhibits no edema and no tenderness.  Mild swelling to left leg.  Left knee diffusely TTP, no erythema or redness. Knee stable.   Neurological: She is alert and oriented to person, place, and time. No cranial nerve deficit. Coordination normal.  Skin: Skin is warm and dry. No rash noted.  Psychiatric: She has a normal mood and affect. Her behavior is normal.    ED Course  Procedures (including critical care time) Labs Review Labs Reviewed  CBC - Abnormal; Notable for the following:    RBC 3.40 (*)    Hemoglobin 10.2 (*)    HCT 29.9 (*)    All other components within normal limits  BASIC METABOLIC PANEL  POCT I-STAT TROPONIN I  POCT I-STAT TROPONIN I   Imaging Review Dg Chest 2 View  09/24/2013   CLINICAL DATA:  Chest pain.  Hypertension.  Diabetes  EXAM: CHEST  2 VIEW  COMPARISON:  None.  FINDINGS: The heart size and mediastinal contours are within normal limits. Both lungs are clear. Fusion hardware noted in the lower cervical spine.  IMPRESSION: No active cardiopulmonary disease.   Electronically Signed   By: Earle Gell M.D.   On: 09/24/2013 15:57    EKG Interpretation    Date/Time:  Saturday September 24 2013 15:15:44 EST Ventricular Rate:  87 PR Interval:  151 QRS Duration: 85 QT Interval:  376 QTC Calculation: 452 R Axis:   33 Text Interpretation:  Sinus rhythm Probable left atrial enlargement No previous ECGs available Confirmed by YAO  MD, DAVID [phone]) on 09/24/2013 3:21:28 PM            MDM   58 yo F with history of chronic pain, DVT, PE, CAD, HTN and DM who presents with chest pain,  bilateral leg and right arm pain. Afebrile, vital sign stable. Patient's symptoms are atypical for ACS, but given risk factors we obtained ECG which showed SR without acute ischemic changes, ISTAT troponin negative. CXR without widened mediastinum, PTX, effusion or consolidation. Hgb 10.2, WBC 7.0, normal lytes and cr. Again considered ACS but felt to be unlikely as symptoms are atypical, negative ECG and two negative troponin. Doubt PE as she is not hypoxic, SOB or tachycardic. She did complain of left leg swelling, duplex negative. Patient with multiple pain complaints which are chronic issues. Advised PCP f/u for these issues. Provided ace wrap and sling. Patient in agreement with plan.   Reviewed imaging, labs, ECG and previous medical records, utilized in MDM  Discussed case with Dr. Darl Householder.   Clinical Impression 1. Chest pain 2. Chronic left knee pain 3. Chronic right arm pain    Louretta Shorten, MD 09/25/13 [phone]

## 2013-09-24 NOTE — Discharge Instructions (Signed)
°Emergency Department Resource Guide °1) Find a Doctor and Pay Out of Pocket °Although you won't have to find out who is covered by your insurance plan, it is a good idea to ask around and get recommendations. You will then need to call the office and see if the doctor you have chosen will accept you as a new patient and what types of options they offer for patients who are self-pay. Some doctors offer discounts or will set up payment plans for their patients who do not have insurance, but you will need to ask so you aren't surprised when you get to your appointment. ° °2) Contact Your Local Health Department °Not all health departments have doctors that can see patients for sick visits, but many do, so it is worth a call to see if yours does. If you don't know where your local health department is, you can check in your phone book. The CDC also has a tool to help you locate your state's health department, and many state websites also have listings of all of their local health departments. ° °3) Find a Walk-in Clinic °If your illness is not likely to be very severe or complicated, you may want to try a walk in clinic. These are popping up all over the country in pharmacies, drugstores, and shopping centers. They're usually staffed by nurse practitioners or physician assistants that have been trained to treat common illnesses and complaints. They're usually fairly quick and inexpensive. However, if you have serious medical issues or chronic medical problems, these are probably not your best option. ° °No Primary Care Doctor: °- Call [facility] at  832-8000 - they can help you locate a primary care doctor that  accepts your insurance, provides certain services, etc. °- Physician Referral Service- 1-800-533-3463 ° °Chronic Pain Problems: °Organization         Address  Phone   Notes  °[facility]  (336) 297-2271 Patients need to be referred by their primary care doctor.  ° °Medication  Assistance: °Organization         Address  Phone   Notes  °[place] [facility] [address] °[place], [place] [address] (336) 641-8030 --Must be a resident of [place] °-- Must have NO insurance coverage whatsoever (no Medicaid/ Medicare, etc.) °-- The pt. MUST have a primary care doctor that directs their care regularly and follows them in the community °  °[facility]  (866) 331-1348   °[employer]  (888) 892-1162   ° °Agencies that provide inexpensive medical care: °Organization         Address  Phone   Notes  °[facility]  (336) 832-8035   ° Internal Medicine    (336) 832-7272   °[facility] [address] °[place], [place] [address] (336) 832-4777   °[facility] [address], °[place] (336) 271-4999   °[facility]    (336) 373-0678   °[facility]    (336) 272-1050   °[facility] ° [address], [place] Phone:  (336) 832-4444, Fax:  (336) 832-4440 Hours of Operation:  9 am - 6 pm, M-F.  Also accepts Medicaid/Medicare and self-pay.  °[facility] ° [address],  Phone: (336) 832-3150, Fax: (336) 832-3151. Hours of Operation:  8:30 am - 5:30 pm, M-F.  Also accepts Medicaid and self-pay.  °[facility] [place] [address], [place] Phone: (336) 878-6027   °[facility] [address], [place], [place] (336)723-1848, Ext. 123 Mondays & Thursdays: 7-9 AM.  First 15 patients are seen on a first come, first serve basis. °  ° °Medicaid-accepting [place] Providers: ° °Organization         Address  Phone   Notes  °[facility] [address], [place] (336) 641-2100 Also accepts self-pay patients.  °[facility] [address], [place] ° (336) 856-9996   °[facility] [address], [place]  (336) 288-8857   °[facility] [address] [place] [address], [place] (336) 299-7000   °Veita Bland [address], [place]  ° (336) 373-1557 Only accepts [place] Medicaid patients after they have their name applied to their card.  ° °Self-Pay (no insurance) in [place]: ° °Organization         Address  Phone   Notes  °Sickle Cell Patients, [facility] [address], [place] (336) 832-1970   °[facility] [address], [place] (336) 832-4400   °[facility] ° [address], [place] (336) 992-4800   °[facility]/Dr. Osei-Bonsu ° [address] [place] [address], [place] or [address], [place] (336) [phone] Phone number for both [place] and [place] locations is the same.  °[facility] [address], [place] (336) 299-0000   °[facility] [address] [place] [address], [place] or [address] (336) 852-7530 °(336) 878-2260   °[facility] [address], [place] (336) 350-1642, phone; (336) 294-5005, fax Sees patients 1st and 3rd Saturday of every month.  Must not qualify for public or private insurance (i.e. Medicaid, Medicare, [place] Health Choice, Veterans' Benefits) • Household income should be no more than 200% of the poverty level •The clinic cannot treat you if you are pregnant or think you are pregnant • Sexually transmitted diseases are not treated at the clinic.  ° ° °Dental Care: °Organization         Address  Phone  Notes  °[place] [facility] [address], [place] (336) 641-6152 Accepts children up to age 21 who are enrolled in Medicaid or [place] Health Choice; pregnant women with a Medicaid card; and children who have applied for Medicaid or [place] Health Choice, but were declined, whose parents can pay a reduced fee at time of service.  °[place]  [facility] [place]  [address], [place] (336) 641-7733 Accepts children up to age 21 who are enrolled in Medicaid or [place] Health Choice; pregnant women with a Medicaid card; and children who have applied for Medicaid or [place] Health Choice, but were declined, whose parents can pay a reduced fee at time of service.  °[facility] ° [address], [place] (336) 641-4533 Patients are seen by appointment only. Walk-ins are not accepted. [facility] will see patients 18 years of age and older. °Monday - Tuesday (8am-5pm) °Most Wednesdays (8:30-5pm) °$30 per visit, cash only  °[facility] ° [address], [place] (336) 641-4533 Patients are seen by appointment only. Walk-ins are not accepted. [facility] will see patients 18 years of age and older. °One   Wednesday Evening (Monthly: Volunteer Based).  $30 per visit, cash only  °[facility]  (919) 537-3737 for adults; Children under age 4, call Graduate Pediatric Dentistry at ([phone]) [phone]. Children aged 4-14, please call ([phone]) [phone] to request a pediatric application. ° Dental services are provided in all areas of dental care including fillings, crowns and bridges, complete and partial dentures, implants, gum treatment, root canals, and extractions. Preventive care is also provided. Treatment is provided to both adults and children. °Patients are selected via a lottery and there is often a waiting list. °  °[facility] [address], °[place] ° (336) 763-8833 [email or web address] °  °[facility] [address], [place], [place] (336)723-1848, Ext. 123 Second and Fourth Thursday of each month, opens at 6:30 AM; Clinic ends at 9 AM.  Patients are seen on a first-come first-served basis, and a limited number are seen during each clinic.  ° °[facility] ° [address], [place], [place] (336) [phone]    Eligibility Requirements °You must have lived in [place], [place], or [place] counties for at least the last three months. °  You cannot be eligible for state or federal sponsored healthcare insurance, including Veterans Administration, Medicaid, or Medicare. °  You generally cannot be eligible for healthcare insurance through your employer.  °  How to apply: °Eligibility screenings are held every Tuesday and Wednesday afternoon from 1:00 pm until 4:00 pm. You do not need an appointment for the interview!  °[facility] [address], [place], [place] 336-631-2330   °[facility]  336-342-8273   °[facility]  336-703-3100   °[facility]  336-570-6415   ° °Behavioral Health Resources in the Community: °Intensive Outpatient Programs °Organization         Address  Phone  Notes  °[facility] [address], [place], [place] 336-878-6098   °[facility] [address], [place], [place] 336-832-9800   °[facility] [address], [place], [place] ° 336-882-2125   °[facility] [address],  °[place], [place] 1-800-853-5163 or 336-641-4981   °Substance Abuse Resources °Organization         Address  Phone  Notes  °[facility]  336-882-2125   °[facility]  336-784-9470   °[facility]  336-285-9073   °[facility]  336-845-3988   °Residential & Outpatient Substance Abuse Program  1-800-659-3381   °Psychological Services °Organization         Address  Phone  Notes  °[facility]  336- 832-9600   °[facility]  336- 378-7881   °[facility] [address], [place] 1-800-853-5163 or 336-641-4981   ° °Mobile Crisis Teams °Organization         Address  Phone  Notes  °[facility], Mobile Crisis Care Unit  1-877-626-1772   °Assertive °[facility] ° [address]  [place], [place] 336-834-9664   °Sharon DeEsch [address] °[place] 336-554-5454   ° °Self-Help/Support Groups °Organization         Address  Phone             Notes  °[facility] [place] - variety of support groups  336- 373-1402 Call for more information  °Narcotics Anonymous (NA), [facility] [address], °[place]  2 meetings at this location  ° °  Residential Treatment Programs Organization         Address  Phone  Notes  [facility] [address]    [place]  1-952-129-5520   [facility]  [address], [place] [address], [place], [place]   [place], [place] 301-751-4596 Admissions: 8am-3pm M-F  [facility] [place] [address],    [place], [place] 034-742-5956   [facility] [address] [place], [place], [place]   [facility] [address],  [place], [place]   [facility] Intensive Outpatient [address], [place], [place]   [facility]  [place], [place] 1-224-502-2410 or 7196409641   [facility] [address], [place], [place] Accepts Medicaid  [facility] [place] [address]  [place] 1-731 372 1056 Substance Abuse/Addiction Treatment   [facility]         Address  Phone  Notes  [facility]  (734) 523-3014   Domenic Schwab, PhD [address] [place]lis [place], [place]   479-314-1655 or 319-628-1603   [place], [place] 812-881-6680   [facility] [address] [place], [place] 213-307-4626 Insurance/Medicaid/sponsorship through [facility] and Families [address]                                    [place], [place] [phone]) [phone] [place] [address], [place] [phone]    Dr. Adele Schilder  ([phone]) [phone]   [facility] Dept. 1) [address], [place] 2) [place] 3)  [place], [place] (626) [phone]  ([phone]   [place] [phone] or [phone] (After Hours)        Chest Pain (Nonspecific) It is often hard to give a specific diagnosis for the cause of chest pain. There is always a chance that your pain could be related to something serious, such as a heart attack or a blood clot in the lungs. You need to follow up with your caregiver for further evaluation. CAUSES   Heartburn.  Pneumonia or bronchitis.  Anxiety or stress.  Inflammation around your heart (pericarditis) or lung (pleuritis or pleurisy).  A blood clot in the lung.  A collapsed lung (pneumothorax). It can develop suddenly on its own (spontaneous pneumothorax) or from injury (trauma) to the chest.  Shingles infection (herpes zoster virus). The chest wall is composed of bones, muscles, and cartilage. Any of these can be the source of the pain.  The bones can be bruised by injury.  The muscles or cartilage can be strained by coughing or overwork.  The cartilage can be affected by inflammation and become sore (costochondritis). DIAGNOSIS  Lab tests or other studies, such as X-rays, electrocardiography, stress testing, or cardiac imaging, may be needed to find the cause of your pain.  TREATMENT   Treatment depends on what may be causing your chest pain. Treatment may include:  Acid blockers for heartburn.  Anti-inflammatory medicine.  Pain medicine for inflammatory conditions.  Antibiotics if an infection is present.  You  may be advised to change lifestyle habits. This includes stopping smoking and avoiding alcohol, caffeine, and chocolate.  You may be advised to keep your  head raised (elevated) when sleeping. This reduces the chance of acid going backward from your stomach into your esophagus.  Most of the time, nonspecific chest pain will improve within 2 to 3 days with rest and mild pain medicine. HOME CARE INSTRUCTIONS   If antibiotics were prescribed, take your antibiotics as directed. Finish them even if you start to feel better.  For the next few days, avoid physical activities that bring on chest pain. Continue physical activities as directed.  Do not smoke.  Avoid drinking alcohol.  Only take over-the-counter or prescription medicine for pain, discomfort, or fever as directed by your caregiver.  Follow your caregiver's suggestions for further testing if your chest pain does not go away.  Keep any follow-up appointments you made. If you do not go to an appointment, you could develop lasting (chronic) problems with pain. If there is any problem keeping an appointment, you must call to reschedule. SEEK MEDICAL CARE IF:   You think you are having problems from the medicine you are taking. Read your medicine instructions carefully.  Your chest pain does not go away, even after treatment.  You develop a rash with blisters on your chest. SEEK IMMEDIATE MEDICAL CARE IF:   You have increased chest pain or pain that spreads to your arm, neck, jaw, back, or abdomen.  You develop shortness of breath, an increasing cough, or you are coughing up blood.  You have severe back or abdominal pain, feel nauseous, or vomit.  You develop severe weakness, fainting, or chills.  You have a fever. THIS IS AN EMERGENCY. Do not wait to see if the pain will go away. Get medical help at once. Call your local emergency services (911 in [place] Do not drive yourself to the hospital. MAKE SURE YOU:   Understand these instructions.  Will watch your condition.  Will get help right away if you are not doing well or get worse. Document Released: 05/28/2005 Document  Revised: 11/10/2011 Document Reviewed: 03/23/2008 [facility] Patient Information 2014 [place].

## 2013-09-24 NOTE — ED Notes (Signed)
Presents with left sided chest pain described as dull and pressure, pain is intermittent. Pain associated with nausea. Denies SOB, deines dizziness.  Denies radiation. Denies chest pain at this time. Reports muscular pain in right arm, and bilateral legs.

## 2013-09-24 NOTE — ED Provider Notes (Signed)
I saw and evaluated the patient, reviewed the resident's note and I agree with the findings and plan.  EKG Interpretation    Date/Time:  Saturday September 24 2013 15:15:44 EST Ventricular Rate:  87 PR Interval:  151 QRS Duration: 85 QT Interval:  376 QTC Calculation: 452 R Axis:   33 Text Interpretation:  Sinus rhythm Probable left atrial enlargement No previous ECGs available Confirmed by Raphel Stickles  MD, Elisandro Jarrett [phone]) on 09/24/2013 3:21:28 PM            Valerie Vazquez is a 58 y.o. female hx of chronic pain, here with diffuse pain. Diffuse pain in chest, legs chronically. Noticed L > R leg swelling for several days. 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 Also intermittent chest pain but shortness of breath. She was seen several days ago at urgent care and was diagnosed with chronic pain. She just moved here from [place] and doesn't have a doctor yet. Her labs unremarkable, trop neg x 2. Bilateral dopplers showed no DVTs. I doubt PE. I think she likely has chronic pain. She has tylenol #3 at home. Will have her continue taking it. She will need a PCP and pain specialist.    Wandra Arthurs, MD 09/24/13 [phone]

## 2013-09-24 NOTE — ED Notes (Signed)
Patient transported to X-ray 

## 2013-09-24 NOTE — ED Notes (Signed)
MD at Bedside.

## 2013-09-24 NOTE — ED Notes (Signed)
Pt states that she is currently chest pain free, but is still having pain in her legs and right arm

## 2013-09-28 NOTE — ED Provider Notes (Signed)
I saw and evaluated the patient, reviewed the resident's note and I agree with the findings and plan.  EKG Interpretation    Date/Time:  Saturday September 24 2013 15:15:44 EST Ventricular Rate:  87 PR Interval:  151 QRS Duration: 85 QT Interval:  376 QTC Calculation: 452 R Axis:   33 Text Interpretation:  Sinus rhythm Probable left atrial enlargement No previous ECGs available Confirmed by Cate Oravec  MD, Terease Marcotte [phone]) on 09/24/2013 3:21:28 PM            Valerie Vazquez is a 58 y.o. female hx of chronic pain, DVT, PE, DM here with chest pain, bilateral leg and right arm pain. She was seen recently for similar problem [place]. She is taking tylenol #3 with minimal relief and hasn't seen a doctor in town yet. Leg pain and swelling for several days, similar to previous DVT. Chest pain that is substernal for 4 days. No shortness of breath. On exam, minimal R >L leg swelling. Duplex showed no DVT bilaterally. i was not concerned for PE. Istat trop neg and symptoms for several days. I doubt ACS. I think she likely has chronic pain. She is already on tylenol #3 and this is her first visit in the ED here. She had been in other hospitals before. I told her that she will need to f/u with PMD and get her pain medicine prescription from one doctor only. She can continue with tylenol #3 for now.    Wandra Arthurs, MD 09/28/13 [phone]

## 2013-10-07 ENCOUNTER — Emergency Department: Payer: Self-pay | Admitting: Emergency Medicine

## 2013-10-07 DIAGNOSIS — M7989 Other specified soft tissue disorders: Secondary | ICD-10-CM | POA: Diagnosis not present

## 2013-10-07 DIAGNOSIS — M79609 Pain in unspecified limb: Secondary | ICD-10-CM | POA: Diagnosis not present

## 2013-10-07 LAB — COMPREHENSIVE METABOLIC PANEL
Albumin: 4 g/dL (ref 3.4–5.0)
Alkaline Phosphatase: 165 U/L — ABNORMAL HIGH
Anion Gap: 3 — ABNORMAL LOW (ref 7–16)
BUN: 18 mg/dL (ref 7–18)
Bilirubin,Total: 0.3 mg/dL (ref 0.2–1.0)
Calcium, Total: 9.4 mg/dL (ref 8.5–10.1)
Chloride: 105 mmol/L (ref 98–107)
Co2: 32 mmol/L (ref 21–32)
Creatinine: 0.81 mg/dL (ref 0.60–1.30)
EGFR (African American): >60
EGFR (Non-African Amer.): >60
Glucose: 90 mg/dL (ref 65–99)
Osmolality: 281 (ref 275–301)
Potassium: 4.1 mmol/L (ref 3.5–5.1)
SGOT(AST): 25 U/L (ref 15–37)
SGPT (ALT): 29 U/L (ref 12–78)
Sodium: 140 mmol/L (ref 136–145)
Total Protein: 8 g/dL (ref 6.4–8.2)

## 2013-10-07 LAB — CBC WITH DIFFERENTIAL/PLATELET
Basophil #: 0 10*3/uL (ref 0.0–0.1)
Basophil %: 0.7 %
Eosinophil #: 0.4 10*3/uL (ref 0.0–0.7)
Eosinophil %: 5.9 %
HCT: 36.8 % (ref 35.0–47.0)
HGB: 12.5 g/dL (ref 12.0–16.0)
Lymphocyte #: 2.1 10*3/uL (ref 1.0–3.6)
Lymphocyte %: 34.9 %
MCH: 31.1 pg (ref 26.0–34.0)
MCHC: 33.8 g/dL (ref 32.0–36.0)
MCV: 92 fL (ref 80–100)
Monocyte #: 0.5 x10 3/mm (ref 0.2–0.9)
Monocyte %: 7.5 %
Neutrophil #: 3.1 10*3/uL (ref 1.4–6.5)
Neutrophil %: 51 %
Platelet: 358 10*3/uL (ref 150–440)
RBC: 4.01 10*6/uL (ref 3.80–5.20)
RDW: 13.8 % (ref 11.5–14.5)
WBC: 6 10*3/uL (ref 3.6–11.0)

## 2013-10-07 LAB — PROTIME-INR
INR: 0.9
Prothrombin Time: 12.2 secs (ref 11.5–14.7)

## 2013-10-07 LAB — APTT: Activated PTT: 25.8 secs (ref 23.6–35.9)

## 2013-10-11 DIAGNOSIS — E039 Hypothyroidism, unspecified: Secondary | ICD-10-CM | POA: Diagnosis not present

## 2013-10-11 DIAGNOSIS — M1610 Unilateral primary osteoarthritis, unspecified hip: Secondary | ICD-10-CM | POA: Diagnosis not present

## 2013-10-11 DIAGNOSIS — I10 Essential (primary) hypertension: Secondary | ICD-10-CM | POA: Diagnosis not present

## 2013-10-11 DIAGNOSIS — Z862 Personal history of diseases of the blood and blood-forming organs and certain disorders involving the immune mechanism: Secondary | ICD-10-CM | POA: Diagnosis not present

## 2013-10-11 DIAGNOSIS — M169 Osteoarthritis of hip, unspecified: Secondary | ICD-10-CM | POA: Diagnosis not present

## 2013-10-12 ENCOUNTER — Ambulatory Visit: Payer: Self-pay | Admitting: Family Medicine

## 2013-10-12 DIAGNOSIS — E785 Hyperlipidemia, unspecified: Secondary | ICD-10-CM | POA: Diagnosis not present

## 2013-10-12 DIAGNOSIS — I10 Essential (primary) hypertension: Secondary | ICD-10-CM | POA: Diagnosis not present

## 2013-10-12 DIAGNOSIS — E039 Hypothyroidism, unspecified: Secondary | ICD-10-CM | POA: Diagnosis not present

## 2013-10-12 DIAGNOSIS — M169 Osteoarthritis of hip, unspecified: Secondary | ICD-10-CM | POA: Diagnosis not present

## 2013-10-12 DIAGNOSIS — M25569 Pain in unspecified knee: Secondary | ICD-10-CM | POA: Diagnosis not present

## 2013-10-12 DIAGNOSIS — M1610 Unilateral primary osteoarthritis, unspecified hip: Secondary | ICD-10-CM | POA: Diagnosis not present

## 2013-10-14 DIAGNOSIS — M76899 Other specified enthesopathies of unspecified lower limb, excluding foot: Secondary | ICD-10-CM | POA: Diagnosis not present

## 2013-10-14 DIAGNOSIS — M1710 Unilateral primary osteoarthritis, unspecified knee: Secondary | ICD-10-CM | POA: Diagnosis not present

## 2013-10-28 ENCOUNTER — Encounter: Payer: Self-pay | Admitting: Internal Medicine

## 2013-10-28 ENCOUNTER — Ambulatory Visit: Payer: Medicare Other | Attending: Internal Medicine | Admitting: Internal Medicine

## 2013-10-28 VITALS — BP 140/80 | HR 82 | Temp 98.3°F | Resp 16 | Ht 66.0 in | Wt 188.0 lb

## 2013-10-28 DIAGNOSIS — M25559 Pain in unspecified hip: Secondary | ICD-10-CM | POA: Insufficient documentation

## 2013-10-28 DIAGNOSIS — G8929 Other chronic pain: Secondary | ICD-10-CM | POA: Insufficient documentation

## 2013-10-28 DIAGNOSIS — E119 Type 2 diabetes mellitus without complications: Secondary | ICD-10-CM | POA: Diagnosis not present

## 2013-10-28 DIAGNOSIS — Z7982 Long term (current) use of aspirin: Secondary | ICD-10-CM | POA: Insufficient documentation

## 2013-10-28 DIAGNOSIS — D649 Anemia, unspecified: Secondary | ICD-10-CM | POA: Insufficient documentation

## 2013-10-28 DIAGNOSIS — Z79899 Other long term (current) drug therapy: Secondary | ICD-10-CM | POA: Diagnosis not present

## 2013-10-28 DIAGNOSIS — Z9889 Other specified postprocedural states: Secondary | ICD-10-CM | POA: Diagnosis not present

## 2013-10-28 DIAGNOSIS — I10 Essential (primary) hypertension: Secondary | ICD-10-CM | POA: Diagnosis not present

## 2013-10-28 DIAGNOSIS — M25552 Pain in left hip: Secondary | ICD-10-CM | POA: Insufficient documentation

## 2013-10-28 DIAGNOSIS — E1110 Type 2 diabetes mellitus with ketoacidosis without coma: Secondary | ICD-10-CM

## 2013-10-28 DIAGNOSIS — E1310 Other specified diabetes mellitus with ketoacidosis without coma: Secondary | ICD-10-CM | POA: Diagnosis not present

## 2013-10-28 DIAGNOSIS — Z139 Encounter for screening, unspecified: Secondary | ICD-10-CM

## 2013-10-28 DIAGNOSIS — E785 Hyperlipidemia, unspecified: Secondary | ICD-10-CM

## 2013-10-28 DIAGNOSIS — E1159 Type 2 diabetes mellitus with other circulatory complications: Secondary | ICD-10-CM | POA: Insufficient documentation

## 2013-10-28 HISTORY — DX: Hyperlipidemia, unspecified: E78.5

## 2013-10-28 HISTORY — DX: Essential (primary) hypertension: I10

## 2013-10-28 LAB — GLUCOSE, POCT (MANUAL RESULT ENTRY): POC Glucose: 105 mg/dl — AB (ref 70–99)

## 2013-10-28 LAB — POCT GLYCOSYLATED HEMOGLOBIN (HGB A1C): Hemoglobin A1C: 5.3

## 2013-10-28 MED ORDER — CLONIDINE HCL 0.1 MG PO TABS
0.1000 mg | ORAL_TABLET | Freq: Once | ORAL | Status: AC
  Administered 2013-10-28: 0.1 mg via ORAL

## 2013-10-28 NOTE — Progress Notes (Signed)
Pt is here to establish care. Pt has chronic pain in her joints and hips. Pt has a history of diabetes and DVT/PE Pt states she has bad breath.

## 2013-10-28 NOTE — Progress Notes (Signed)
Patient Demographics  Valerie Vazquez, is a 58 y.o. female  NLZ:[id number]  TKW:[id number]  DOB - 05/21/1956  CC:  Chief Complaint  Patient presents with  . Establish Care       HPI: Valerie Vazquez is a 58 y.o. female here today to establish medical care. She has history of hypertension anemia chronic left hip pain hyperlipidemia, patient already following up with orthopedic surgery and is taking pain medication, she is history of chronic anemia used to see a hematology/oncology, is to take iron supplement in the past not taking any more, she denies any bleeding , today her blood pressure was elevated as per patient she has not taken her blood pressure medication which is Cardizem, denies any headache dizziness chest and shortness of breath, for pain she has been taking Neurontin as well as Tylenol No. 3, history of hyperlipidemia patient is taking Pravachol 40 mg. Patient has No headache, No chest pain, No abdominal pain - No Nausea, No new weakness tingling or numbness, No Cough - SOB.  Allergies  Allergen Reactions  . Abilify [Aripiprazole] Other (See Comments)    Motor skills   . Augmentin [Amoxicillin-Pot Clavulanate] Nausea And Vomiting  . Bactrim [Sulfamethoxazole-Tmp Ds] Diarrhea  . Ceftin [Cefuroxime Axetil] Diarrhea  . Indocin [Indomethacin] Other (See Comments)    Migraines   . Lisinopril Diarrhea and Other (See Comments)  . Morphine And Related Other (See Comments)    Chest tightness   . Penicillins Itching  . Percocet [Oxycodone-Acetaminophen] Itching  . Wellbutrin [Bupropion] Other (See Comments)    Slurred speech   Past Medical History  Diagnosis Date  . Arthritis   . Spinal stenosis   . Spondylosis    Current Outpatient Prescriptions on File Prior to Visit  Medication Sig Dispense Refill  . acetaminophen-codeine (TYLENOL #3) 300-30 MG per tablet Take 1 tablet by mouth every 4 (four) hours as needed for moderate pain.      . Aspirin-Caffeine (BAYER  BACK & BODY PAIN EX ST) 500-32.5 MG TABS Take 1 tablet by mouth every 6 (six) hours as needed (pain).      . cyclobenzaprine (FLEXERIL) 5 MG tablet Take 5 mg by mouth 3 (three) times daily as needed for muscle spasms.      Marland Kitchen gabapentin (NEURONTIN) 600 MG tablet Take 600 mg by mouth 3 (three) times daily.      Marland Kitchen lidocaine (LIDODERM) 5 % Place 1 patch onto the skin daily. Remove & Discard patch within 12 hours or as directed by MD      . naproxen sodium (ANAPROX) 220 MG tablet Take 220 mg by mouth 2 (two) times daily with a meal.      . dicyclomine (BENTYL) 10 MG capsule Take 10 mg by mouth 4 (four) times daily -  before meals and at bedtime.      . diphenhydrAMINE (SOMINEX) 25 MG tablet Take 25 mg by mouth at bedtime as needed for sleep.      Marland Kitchen trolamine salicylate (ASPERCREME) 10 % cream Apply 1 application topically as needed for muscle pain.       No current facility-administered medications on file prior to visit.   History reviewed. No pertinent family history. History   Social History  . Marital Status: Married    Spouse Name: N/A    Number of Children: N/A  . Years of Education: N/A   Occupational History  . Not on file.   Social History Main Topics  . Smoking status:  Never Smoker   . Smokeless tobacco: Not on file  . Alcohol Use: No  . Drug Use: No  . Sexual Activity: Not on file   Other Topics Concern  . Not on file   Social History Narrative  . No narrative on file    Review of Systems: Constitutional: Negative for fever, chills, diaphoresis, activity change, appetite change and fatigue. HENT: Negative for ear pain, nosebleeds, congestion, facial swelling, rhinorrhea, neck pain, neck stiffness and ear discharge.  Eyes: Negative for pain, discharge, redness, itching and visual disturbance. Respiratory: Negative for cough, choking, chest tightness, shortness of breath, wheezing and stridor.  Cardiovascular: Negative for chest pain, palpitations and leg  swelling. Gastrointestinal: Negative for abdominal distention. Genitourinary: Negative for dysuria, urgency, frequency, hematuria, flank pain, decreased urine volume, difficulty urinating and dyspareunia.  Musculoskeletal: Negative for back pain, joint swelling, +arthralgia and gait problem. Neurological: Negative for dizziness, tremors, seizures, syncope, facial asymmetry, speech difficulty, weakness, light-headedness, numbness and headaches.  Hematological: Negative for adenopathy. Does not bruise/bleed easily. Psychiatric/Behavioral: Negative for hallucinations, behavioral problems, confusion, dysphoric mood, decreased concentration and agitation.    Objective:   Filed Vitals:   10/28/13 1408  BP: 164/112  Pulse: 82  Temp: 98.3 F (36.8 C)  Resp: 16    Physical Exam: Constitutional: Patient appears well-developed and well-nourished. No distress. HENT: Normocephalic, atraumatic, External right and left ear normal. Oropharynx is clear and moist.  Eyes: Conjunctivae and EOM are normal. PERRLA, no scleral icterus. Neck: Normal ROM. Neck supple. No JVD. No tracheal deviation. No thyromegaly. CVS: RRR, S1/S2 +, no murmurs, no gallops, no carotid bruit.  Pulmonary: Effort and breath sounds normal, no stridor, rhonchi, wheezes, rales.  Abdominal: Soft. BS +, no distension, tenderness, rebound or guarding.  Musculoskeletal: Normal range of motion. Some tenderness on the left hip joint.  Neuro: Alert. Normal reflexes, muscle tone coordination. No cranial nerve deficit. Skin: Skin is warm and dry. No rash noted. Not diaphoretic. No erythema. No pallor. Psychiatric: Normal mood and affect. Behavior, judgment, thought content normal.  Lab Results  Component Value Date   WBC 7.0 09/24/2013   HGB 10.2* 09/24/2013   HCT 29.9* 09/24/2013   MCV 87.9 09/24/2013   PLT 370 09/24/2013   Lab Results  Component Value Date   CREATININE 0.74 09/24/2013   BUN 12 09/24/2013   NA 143 09/24/2013   K 4.6  09/24/2013   CL 104 09/24/2013   CO2 28 09/24/2013    No results found for this basename: HGBA1C   Lipid Panel  No results found for this basename: chol, trig, hdl, cholhdl, vldl, ldlcalc       Assessment and plan:   1. HTN (hypertension)  - cloNIDine (CATAPRES) tablet 0.1 mg; Take 1 tablet (0.1 mg total) by mouth once. - CBC with Differential - COMPLETE METABOLIC PANEL WITH GFR - TSH - Lipid panel  2. history of DM (diabetes mellitus) type   - Glucose (CBG) - HgB A1c  3. History of neck surgery   4. Left hip pain Pain medication when necessary following up with her orthopedic doctor.  5. Anemia History of anemia will check anemia panel. - Anemia panel   7. Other and unspecified hyperlipidemia Will check lipid profile. Patient is on Pravachol.       Return in about 6 weeks (around 12/09/2013) for hypertension.  Lorayne Marek, MD

## 2013-11-07 DIAGNOSIS — M76899 Other specified enthesopathies of unspecified lower limb, excluding foot: Secondary | ICD-10-CM | POA: Diagnosis not present

## 2013-11-08 DIAGNOSIS — M1610 Unilateral primary osteoarthritis, unspecified hip: Secondary | ICD-10-CM | POA: Diagnosis not present

## 2013-11-08 DIAGNOSIS — K219 Gastro-esophageal reflux disease without esophagitis: Secondary | ICD-10-CM | POA: Diagnosis not present

## 2013-11-08 DIAGNOSIS — G4700 Insomnia, unspecified: Secondary | ICD-10-CM | POA: Diagnosis not present

## 2013-11-08 DIAGNOSIS — T7491XA Unspecified adult maltreatment, confirmed, initial encounter: Secondary | ICD-10-CM | POA: Diagnosis not present

## 2013-11-08 DIAGNOSIS — M169 Osteoarthritis of hip, unspecified: Secondary | ICD-10-CM | POA: Diagnosis not present

## 2013-11-24 ENCOUNTER — Ambulatory Visit: Payer: Medicare Other | Admitting: Internal Medicine

## 2013-12-10 ENCOUNTER — Emergency Department: Payer: Self-pay | Admitting: Emergency Medicine

## 2013-12-10 DIAGNOSIS — R300 Dysuria: Secondary | ICD-10-CM | POA: Diagnosis not present

## 2013-12-10 DIAGNOSIS — I2510 Atherosclerotic heart disease of native coronary artery without angina pectoris: Secondary | ICD-10-CM | POA: Diagnosis not present

## 2013-12-10 DIAGNOSIS — E785 Hyperlipidemia, unspecified: Secondary | ICD-10-CM | POA: Diagnosis not present

## 2013-12-10 DIAGNOSIS — D649 Anemia, unspecified: Secondary | ICD-10-CM | POA: Diagnosis not present

## 2013-12-10 LAB — CBC WITH DIFFERENTIAL/PLATELET
Basophil #: 0 10*3/uL (ref 0.0–0.1)
Basophil %: 0.3 %
Eosinophil #: 0.2 10*3/uL (ref 0.0–0.7)
Eosinophil %: 2.3 %
HCT: 33.8 % — ABNORMAL LOW (ref 35.0–47.0)
HGB: 11.1 g/dL — ABNORMAL LOW (ref 12.0–16.0)
Lymphocyte #: 2.7 10*3/uL (ref 1.0–3.6)
Lymphocyte %: 28.4 %
MCH: 30.6 pg (ref 26.0–34.0)
MCHC: 32.8 g/dL (ref 32.0–36.0)
MCV: 93 fL (ref 80–100)
Monocyte #: 0.6 x10 3/mm (ref 0.2–0.9)
Monocyte %: 6.6 %
Neutrophil #: 5.8 10*3/uL (ref 1.4–6.5)
Neutrophil %: 62.4 %
Platelet: 315 10*3/uL (ref 150–440)
RBC: 3.63 10*6/uL — ABNORMAL LOW (ref 3.80–5.20)
RDW: 14.9 % — ABNORMAL HIGH (ref 11.5–14.5)
WBC: 9.3 10*3/uL (ref 3.6–11.0)

## 2013-12-10 LAB — URINALYSIS, COMPLETE
Bilirubin,UR: NEGATIVE
Blood: NEGATIVE
Glucose,UR: NEGATIVE mg/dL (ref 0–75)
Hyaline Cast: 23
Ketone: NEGATIVE
Nitrite: NEGATIVE
Ph: 5 (ref 4.5–8.0)
Protein: NEGATIVE
RBC,UR: 3 /HPF (ref 0–5)
Specific Gravity: 1.025 (ref 1.003–1.030)
Squamous Epithelial: 5
WBC UR: 5 /HPF (ref 0–5)

## 2013-12-10 LAB — COMPREHENSIVE METABOLIC PANEL
Albumin: 3.6 g/dL (ref 3.4–5.0)
Alkaline Phosphatase: 103 U/L
Anion Gap: 3 — ABNORMAL LOW (ref 7–16)
BUN: 16 mg/dL (ref 7–18)
Bilirubin,Total: 0.2 mg/dL (ref 0.2–1.0)
Calcium, Total: 8.7 mg/dL (ref 8.5–10.1)
Chloride: 110 mmol/L — ABNORMAL HIGH (ref 98–107)
Co2: 27 mmol/L (ref 21–32)
Creatinine: 0.99 mg/dL (ref 0.60–1.30)
EGFR (African American): >60
EGFR (Non-African Amer.): >60
Glucose: 112 mg/dL — ABNORMAL HIGH (ref 65–99)
Osmolality: 281 (ref 275–301)
Potassium: 4 mmol/L (ref 3.5–5.1)
SGOT(AST): 41 U/L — ABNORMAL HIGH (ref 15–37)
SGPT (ALT): 59 U/L (ref 12–78)
Sodium: 140 mmol/L (ref 136–145)
Total Protein: 7.2 g/dL (ref 6.4–8.2)

## 2013-12-19 ENCOUNTER — Ambulatory Visit: Payer: Self-pay | Admitting: Internal Medicine

## 2013-12-19 DIAGNOSIS — Z79899 Other long term (current) drug therapy: Secondary | ICD-10-CM | POA: Diagnosis not present

## 2013-12-19 DIAGNOSIS — M1990 Unspecified osteoarthritis, unspecified site: Secondary | ICD-10-CM | POA: Diagnosis not present

## 2013-12-19 DIAGNOSIS — M25569 Pain in unspecified knee: Secondary | ICD-10-CM | POA: Diagnosis not present

## 2013-12-19 DIAGNOSIS — Z90710 Acquired absence of both cervix and uterus: Secondary | ICD-10-CM | POA: Diagnosis not present

## 2013-12-19 DIAGNOSIS — Z86718 Personal history of other venous thrombosis and embolism: Secondary | ICD-10-CM | POA: Diagnosis not present

## 2013-12-19 DIAGNOSIS — F329 Major depressive disorder, single episode, unspecified: Secondary | ICD-10-CM | POA: Diagnosis not present

## 2013-12-19 DIAGNOSIS — D509 Iron deficiency anemia, unspecified: Secondary | ICD-10-CM | POA: Diagnosis not present

## 2013-12-19 DIAGNOSIS — I10 Essential (primary) hypertension: Secondary | ICD-10-CM | POA: Diagnosis not present

## 2013-12-19 DIAGNOSIS — M25559 Pain in unspecified hip: Secondary | ICD-10-CM | POA: Diagnosis not present

## 2013-12-19 DIAGNOSIS — F3289 Other specified depressive episodes: Secondary | ICD-10-CM | POA: Diagnosis not present

## 2013-12-19 DIAGNOSIS — Z9089 Acquired absence of other organs: Secondary | ICD-10-CM | POA: Diagnosis not present

## 2013-12-19 DIAGNOSIS — I2510 Atherosclerotic heart disease of native coronary artery without angina pectoris: Secondary | ICD-10-CM | POA: Diagnosis not present

## 2013-12-19 DIAGNOSIS — K589 Irritable bowel syndrome without diarrhea: Secondary | ICD-10-CM | POA: Diagnosis not present

## 2013-12-19 DIAGNOSIS — E039 Hypothyroidism, unspecified: Secondary | ICD-10-CM | POA: Diagnosis not present

## 2013-12-19 DIAGNOSIS — Z86711 Personal history of pulmonary embolism: Secondary | ICD-10-CM | POA: Diagnosis not present

## 2013-12-19 DIAGNOSIS — K219 Gastro-esophageal reflux disease without esophagitis: Secondary | ICD-10-CM | POA: Diagnosis not present

## 2013-12-20 DIAGNOSIS — D509 Iron deficiency anemia, unspecified: Secondary | ICD-10-CM | POA: Diagnosis not present

## 2013-12-20 DIAGNOSIS — Z86711 Personal history of pulmonary embolism: Secondary | ICD-10-CM | POA: Diagnosis not present

## 2013-12-20 DIAGNOSIS — Z86718 Personal history of other venous thrombosis and embolism: Secondary | ICD-10-CM | POA: Diagnosis not present

## 2013-12-20 LAB — IRON AND TIBC
Iron Bind.Cap.(Total): 346 ug/dL (ref 250–450)
Iron Saturation: 18 %
Iron: 64 ug/dL (ref 50–170)
Unbound Iron-Bind.Cap.: 282 ug/dL

## 2013-12-20 LAB — CBC CANCER CENTER
Basophil #: 0.1 x10 3/mm (ref 0.0–0.1)
Basophil %: 0.8 %
Eosinophil #: 0.2 x10 3/mm (ref 0.0–0.7)
Eosinophil %: 3.5 %
HCT: 32.2 % — ABNORMAL LOW (ref 35.0–47.0)
HGB: 10.7 g/dL — ABNORMAL LOW (ref 12.0–16.0)
Lymphocyte #: 2.3 x10 3/mm (ref 1.0–3.6)
Lymphocyte %: 34.8 %
MCH: 30.9 pg (ref 26.0–34.0)
MCHC: 33.4 g/dL (ref 32.0–36.0)
MCV: 93 fL (ref 80–100)
Monocyte #: 0.5 x10 3/mm (ref 0.2–0.9)
Monocyte %: 7.1 %
Neutrophil #: 3.5 x10 3/mm (ref 1.4–6.5)
Neutrophil %: 53.8 %
Platelet: 325 x10 3/mm (ref 150–440)
RBC: 3.48 10*6/uL — ABNORMAL LOW (ref 3.80–5.20)
RDW: 14.6 % — ABNORMAL HIGH (ref 11.5–14.5)
WBC: 6.5 x10 3/mm (ref 3.6–11.0)

## 2013-12-20 LAB — FOLATE: Folic Acid: 19.7 ng/mL (ref 3.1–100.0)

## 2013-12-20 LAB — FERRITIN: Ferritin (ARMC): 27 ng/mL (ref 8–388)

## 2013-12-20 LAB — LACTATE DEHYDROGENASE: LDH: 192 U/L (ref 81–246)

## 2013-12-20 LAB — RETICULOCYTES
Absolute Retic Count: 0.0434 10*6/uL (ref 0.019–0.186)
Reticulocyte: 1.24 % (ref 0.4–3.1)

## 2013-12-23 LAB — KAPPA/LAMBDA FREE LIGHT CHAINS (ARMC)

## 2013-12-23 LAB — PROT IMMUNOELECTROPHORES(ARMC)

## 2013-12-30 ENCOUNTER — Ambulatory Visit: Payer: Self-pay | Admitting: Internal Medicine

## 2013-12-31 ENCOUNTER — Emergency Department: Payer: Self-pay | Admitting: Emergency Medicine

## 2013-12-31 DIAGNOSIS — R079 Chest pain, unspecified: Secondary | ICD-10-CM | POA: Diagnosis not present

## 2013-12-31 DIAGNOSIS — I509 Heart failure, unspecified: Secondary | ICD-10-CM | POA: Diagnosis not present

## 2013-12-31 DIAGNOSIS — R209 Unspecified disturbances of skin sensation: Secondary | ICD-10-CM | POA: Diagnosis not present

## 2013-12-31 DIAGNOSIS — M25559 Pain in unspecified hip: Secondary | ICD-10-CM | POA: Diagnosis not present

## 2013-12-31 DIAGNOSIS — M5430 Sciatica, unspecified side: Secondary | ICD-10-CM | POA: Diagnosis not present

## 2013-12-31 DIAGNOSIS — M1610 Unilateral primary osteoarthritis, unspecified hip: Secondary | ICD-10-CM | POA: Diagnosis not present

## 2013-12-31 DIAGNOSIS — M169 Osteoarthritis of hip, unspecified: Secondary | ICD-10-CM | POA: Diagnosis not present

## 2013-12-31 DIAGNOSIS — M47817 Spondylosis without myelopathy or radiculopathy, lumbosacral region: Secondary | ICD-10-CM | POA: Diagnosis not present

## 2013-12-31 DIAGNOSIS — J90 Pleural effusion, not elsewhere classified: Secondary | ICD-10-CM | POA: Diagnosis not present

## 2014-01-01 ENCOUNTER — Emergency Department: Payer: Self-pay | Admitting: Emergency Medicine

## 2014-01-01 DIAGNOSIS — R209 Unspecified disturbances of skin sensation: Secondary | ICD-10-CM | POA: Diagnosis not present

## 2014-01-01 DIAGNOSIS — R079 Chest pain, unspecified: Secondary | ICD-10-CM | POA: Diagnosis not present

## 2014-01-01 DIAGNOSIS — M47817 Spondylosis without myelopathy or radiculopathy, lumbosacral region: Secondary | ICD-10-CM | POA: Diagnosis not present

## 2014-01-01 DIAGNOSIS — M1610 Unilateral primary osteoarthritis, unspecified hip: Secondary | ICD-10-CM | POA: Diagnosis not present

## 2014-01-01 DIAGNOSIS — I509 Heart failure, unspecified: Secondary | ICD-10-CM | POA: Diagnosis not present

## 2014-01-01 DIAGNOSIS — J90 Pleural effusion, not elsewhere classified: Secondary | ICD-10-CM | POA: Diagnosis not present

## 2014-01-01 DIAGNOSIS — M5430 Sciatica, unspecified side: Secondary | ICD-10-CM | POA: Diagnosis not present

## 2014-01-01 LAB — COMPREHENSIVE METABOLIC PANEL
Albumin: 3.5 g/dL (ref 3.4–5.0)
Alkaline Phosphatase: 154 U/L — ABNORMAL HIGH
Anion Gap: 5 — ABNORMAL LOW (ref 7–16)
BUN: 13 mg/dL (ref 7–18)
Bilirubin,Total: 0.2 mg/dL (ref 0.2–1.0)
Calcium, Total: 8.9 mg/dL (ref 8.5–10.1)
Chloride: 110 mmol/L — ABNORMAL HIGH (ref 98–107)
Co2: 28 mmol/L (ref 21–32)
Creatinine: 0.98 mg/dL (ref 0.60–1.30)
EGFR (African American): >60
EGFR (Non-African Amer.): >60
Glucose: 107 mg/dL — ABNORMAL HIGH (ref 65–99)
Osmolality: 286 (ref 275–301)
Potassium: 3.9 mmol/L (ref 3.5–5.1)
SGOT(AST): 137 U/L — ABNORMAL HIGH (ref 15–37)
SGPT (ALT): 187 U/L — ABNORMAL HIGH (ref 12–78)
Sodium: 143 mmol/L (ref 136–145)
Total Protein: 7.1 g/dL (ref 6.4–8.2)

## 2014-01-01 LAB — CBC WITH DIFFERENTIAL/PLATELET
Basophil #: 0 10*3/uL (ref 0.0–0.1)
Basophil %: 0.5 %
Eosinophil #: 0.3 10*3/uL (ref 0.0–0.7)
Eosinophil %: 4.6 %
HCT: 34.2 % — ABNORMAL LOW (ref 35.0–47.0)
HGB: 11.5 g/dL — ABNORMAL LOW (ref 12.0–16.0)
Lymphocyte #: 2.1 10*3/uL (ref 1.0–3.6)
Lymphocyte %: 28.8 %
MCH: 31 pg (ref 26.0–34.0)
MCHC: 33.6 g/dL (ref 32.0–36.0)
MCV: 92 fL (ref 80–100)
Monocyte #: 0.6 x10 3/mm (ref 0.2–0.9)
Monocyte %: 7.8 %
Neutrophil #: 4.2 10*3/uL (ref 1.4–6.5)
Neutrophil %: 58.3 %
Platelet: 287 10*3/uL (ref 150–440)
RBC: 3.72 10*6/uL — ABNORMAL LOW (ref 3.80–5.20)
RDW: 14.1 % (ref 11.5–14.5)
WBC: 7.2 10*3/uL (ref 3.6–11.0)

## 2014-01-01 LAB — LIPASE, BLOOD: Lipase: 53 U/L — ABNORMAL LOW (ref 73–393)

## 2014-01-03 ENCOUNTER — Ambulatory Visit: Payer: Self-pay | Admitting: Internal Medicine

## 2014-01-03 DIAGNOSIS — G8929 Other chronic pain: Secondary | ICD-10-CM | POA: Diagnosis not present

## 2014-01-03 DIAGNOSIS — Z7901 Long term (current) use of anticoagulants: Secondary | ICD-10-CM | POA: Diagnosis not present

## 2014-01-03 DIAGNOSIS — R079 Chest pain, unspecified: Secondary | ICD-10-CM | POA: Diagnosis not present

## 2014-01-03 DIAGNOSIS — M1990 Unspecified osteoarthritis, unspecified site: Secondary | ICD-10-CM | POA: Diagnosis not present

## 2014-01-03 DIAGNOSIS — Z86718 Personal history of other venous thrombosis and embolism: Secondary | ICD-10-CM | POA: Diagnosis not present

## 2014-01-03 DIAGNOSIS — R002 Palpitations: Secondary | ICD-10-CM | POA: Diagnosis not present

## 2014-01-03 DIAGNOSIS — M25559 Pain in unspecified hip: Secondary | ICD-10-CM | POA: Diagnosis not present

## 2014-01-03 DIAGNOSIS — Z79899 Other long term (current) drug therapy: Secondary | ICD-10-CM | POA: Diagnosis not present

## 2014-01-03 DIAGNOSIS — Z86711 Personal history of pulmonary embolism: Secondary | ICD-10-CM | POA: Diagnosis not present

## 2014-01-03 DIAGNOSIS — IMO0002 Reserved for concepts with insufficient information to code with codable children: Secondary | ICD-10-CM | POA: Diagnosis not present

## 2014-01-03 DIAGNOSIS — D649 Anemia, unspecified: Secondary | ICD-10-CM | POA: Diagnosis not present

## 2014-01-03 DIAGNOSIS — Z9089 Acquired absence of other organs: Secondary | ICD-10-CM | POA: Diagnosis not present

## 2014-01-03 DIAGNOSIS — Z1231 Encounter for screening mammogram for malignant neoplasm of breast: Secondary | ICD-10-CM | POA: Diagnosis not present

## 2014-01-03 DIAGNOSIS — K5900 Constipation, unspecified: Secondary | ICD-10-CM | POA: Diagnosis not present

## 2014-01-03 LAB — CANCER CENTER HEMOGLOBIN: HGB: 11.7 g/dL — ABNORMAL LOW (ref 12.0–16.0)

## 2014-01-03 LAB — IRON AND TIBC
Iron Bind.Cap.(Total): 362 ug/dL (ref 250–450)
Iron Saturation: 20 %
Iron: 74 ug/dL (ref 50–170)
Unbound Iron-Bind.Cap.: 288 ug/dL

## 2014-01-03 LAB — FERRITIN: Ferritin (ARMC): 35 ng/mL (ref 8–388)

## 2014-01-04 DIAGNOSIS — Z86711 Personal history of pulmonary embolism: Secondary | ICD-10-CM | POA: Diagnosis not present

## 2014-01-04 DIAGNOSIS — Z7901 Long term (current) use of anticoagulants: Secondary | ICD-10-CM | POA: Diagnosis not present

## 2014-01-04 DIAGNOSIS — D649 Anemia, unspecified: Secondary | ICD-10-CM | POA: Diagnosis not present

## 2014-01-04 DIAGNOSIS — R002 Palpitations: Secondary | ICD-10-CM | POA: Diagnosis not present

## 2014-01-04 DIAGNOSIS — Z1231 Encounter for screening mammogram for malignant neoplasm of breast: Secondary | ICD-10-CM | POA: Diagnosis not present

## 2014-01-04 DIAGNOSIS — Z86718 Personal history of other venous thrombosis and embolism: Secondary | ICD-10-CM | POA: Diagnosis not present

## 2014-01-05 DIAGNOSIS — Z86718 Personal history of other venous thrombosis and embolism: Secondary | ICD-10-CM | POA: Diagnosis not present

## 2014-01-05 DIAGNOSIS — Z86711 Personal history of pulmonary embolism: Secondary | ICD-10-CM | POA: Diagnosis not present

## 2014-01-05 DIAGNOSIS — D649 Anemia, unspecified: Secondary | ICD-10-CM | POA: Diagnosis not present

## 2014-01-05 DIAGNOSIS — Z1231 Encounter for screening mammogram for malignant neoplasm of breast: Secondary | ICD-10-CM | POA: Diagnosis not present

## 2014-01-05 DIAGNOSIS — Z7901 Long term (current) use of anticoagulants: Secondary | ICD-10-CM | POA: Diagnosis not present

## 2014-01-05 DIAGNOSIS — R002 Palpitations: Secondary | ICD-10-CM | POA: Diagnosis not present

## 2014-01-05 LAB — OCCULT BLOOD X 1 CARD TO LAB, STOOL
Occult Blood, Feces: NEGATIVE
Occult Blood, Feces: NEGATIVE
Occult Blood, Feces: NEGATIVE

## 2014-01-05 LAB — KAPPA/LAMBDA FREE LIGHT CHAINS (ARMC)

## 2014-01-05 LAB — PROT IMMUNOELECTROPHORES(ARMC)

## 2014-01-09 DIAGNOSIS — Z86711 Personal history of pulmonary embolism: Secondary | ICD-10-CM | POA: Diagnosis not present

## 2014-01-09 DIAGNOSIS — R002 Palpitations: Secondary | ICD-10-CM | POA: Diagnosis not present

## 2014-01-09 DIAGNOSIS — D649 Anemia, unspecified: Secondary | ICD-10-CM | POA: Diagnosis not present

## 2014-01-09 DIAGNOSIS — Z1231 Encounter for screening mammogram for malignant neoplasm of breast: Secondary | ICD-10-CM | POA: Diagnosis not present

## 2014-01-09 DIAGNOSIS — Z7901 Long term (current) use of anticoagulants: Secondary | ICD-10-CM | POA: Diagnosis not present

## 2014-01-09 DIAGNOSIS — Z86718 Personal history of other venous thrombosis and embolism: Secondary | ICD-10-CM | POA: Diagnosis not present

## 2014-01-09 LAB — PROTIME-INR
INR: 1
Prothrombin Time: 13.1 secs (ref 11.5–14.7)

## 2014-01-09 LAB — PLATELET FUNCTION ASSAY
COL/ADP PLT FXN SCRN: 60 Seconds (ref 0–100)
COL/EPI PLT FXN SCRN: 72 Seconds

## 2014-01-24 DIAGNOSIS — Z86718 Personal history of other venous thrombosis and embolism: Secondary | ICD-10-CM | POA: Diagnosis not present

## 2014-01-24 DIAGNOSIS — Z7901 Long term (current) use of anticoagulants: Secondary | ICD-10-CM | POA: Diagnosis not present

## 2014-01-24 DIAGNOSIS — Z1231 Encounter for screening mammogram for malignant neoplasm of breast: Secondary | ICD-10-CM | POA: Diagnosis not present

## 2014-01-24 DIAGNOSIS — D649 Anemia, unspecified: Secondary | ICD-10-CM | POA: Diagnosis not present

## 2014-01-24 DIAGNOSIS — Z86711 Personal history of pulmonary embolism: Secondary | ICD-10-CM | POA: Diagnosis not present

## 2014-01-24 DIAGNOSIS — R002 Palpitations: Secondary | ICD-10-CM | POA: Diagnosis not present

## 2014-01-24 LAB — PLATELET FUNCTION ASSAY
COL/ADP PLT FXN SCRN: 39 Seconds (ref 0–100)
COL/EPI PLT FXN SCRN: 101 Seconds

## 2014-01-30 ENCOUNTER — Ambulatory Visit: Payer: Self-pay | Admitting: Internal Medicine

## 2014-02-01 ENCOUNTER — Emergency Department: Payer: Self-pay | Admitting: Emergency Medicine

## 2014-02-01 DIAGNOSIS — Z0389 Encounter for observation for other suspected diseases and conditions ruled out: Secondary | ICD-10-CM | POA: Diagnosis not present

## 2014-02-01 DIAGNOSIS — M7989 Other specified soft tissue disorders: Secondary | ICD-10-CM | POA: Diagnosis not present

## 2014-02-01 DIAGNOSIS — M1610 Unilateral primary osteoarthritis, unspecified hip: Secondary | ICD-10-CM | POA: Diagnosis not present

## 2014-02-01 DIAGNOSIS — M169 Osteoarthritis of hip, unspecified: Secondary | ICD-10-CM | POA: Diagnosis not present

## 2014-02-01 DIAGNOSIS — G8929 Other chronic pain: Secondary | ICD-10-CM | POA: Diagnosis not present

## 2014-02-01 DIAGNOSIS — M79609 Pain in unspecified limb: Secondary | ICD-10-CM | POA: Diagnosis not present

## 2014-02-01 DIAGNOSIS — R609 Edema, unspecified: Secondary | ICD-10-CM | POA: Diagnosis not present

## 2014-02-01 LAB — TROPONIN I: Troponin-I: <0.02 ng/mL

## 2014-02-01 LAB — CBC
HCT: 34.4 % — ABNORMAL LOW (ref 35.0–47.0)
HGB: 11.4 g/dL — ABNORMAL LOW (ref 12.0–16.0)
MCH: 31 pg (ref 26.0–34.0)
MCHC: 33.3 g/dL (ref 32.0–36.0)
MCV: 93 fL (ref 80–100)
Platelet: 374 10*3/uL (ref 150–440)
RBC: 3.69 10*6/uL — ABNORMAL LOW (ref 3.80–5.20)
RDW: 13.5 % (ref 11.5–14.5)
WBC: 8 10*3/uL (ref 3.6–11.0)

## 2014-02-01 LAB — BASIC METABOLIC PANEL
Anion Gap: 6 — ABNORMAL LOW (ref 7–16)
BUN: 10 mg/dL (ref 7–18)
Calcium, Total: 9.3 mg/dL (ref 8.5–10.1)
Chloride: 104 mmol/L (ref 98–107)
Co2: 30 mmol/L (ref 21–32)
Creatinine: 0.7 mg/dL (ref 0.60–1.30)
EGFR (African American): >60
EGFR (Non-African Amer.): >60
Glucose: 91 mg/dL (ref 65–99)
Osmolality: 278 (ref 275–301)
Potassium: 3.7 mmol/L (ref 3.5–5.1)
Sodium: 140 mmol/L (ref 136–145)

## 2014-02-03 ENCOUNTER — Ambulatory Visit: Payer: Self-pay | Admitting: Internal Medicine

## 2014-02-14 DIAGNOSIS — M1610 Unilateral primary osteoarthritis, unspecified hip: Secondary | ICD-10-CM | POA: Diagnosis not present

## 2014-03-01 DIAGNOSIS — R05 Cough: Secondary | ICD-10-CM | POA: Diagnosis not present

## 2014-03-01 DIAGNOSIS — G8929 Other chronic pain: Secondary | ICD-10-CM | POA: Diagnosis not present

## 2014-03-01 DIAGNOSIS — M169 Osteoarthritis of hip, unspecified: Secondary | ICD-10-CM | POA: Diagnosis not present

## 2014-03-01 DIAGNOSIS — M47812 Spondylosis without myelopathy or radiculopathy, cervical region: Secondary | ICD-10-CM | POA: Diagnosis not present

## 2014-03-01 DIAGNOSIS — R059 Cough, unspecified: Secondary | ICD-10-CM | POA: Diagnosis not present

## 2014-03-01 DIAGNOSIS — M1610 Unilateral primary osteoarthritis, unspecified hip: Secondary | ICD-10-CM | POA: Diagnosis not present

## 2014-03-01 HISTORY — PX: VENA CAVA FILTER PLACEMENT: SUR1032

## 2014-03-06 DIAGNOSIS — M1990 Unspecified osteoarthritis, unspecified site: Secondary | ICD-10-CM | POA: Diagnosis not present

## 2014-03-06 DIAGNOSIS — M7989 Other specified soft tissue disorders: Secondary | ICD-10-CM | POA: Diagnosis not present

## 2014-03-06 DIAGNOSIS — M79609 Pain in unspecified limb: Secondary | ICD-10-CM | POA: Diagnosis not present

## 2014-03-06 DIAGNOSIS — I87099 Postthrombotic syndrome with other complications of unspecified lower extremity: Secondary | ICD-10-CM | POA: Diagnosis not present

## 2014-03-10 ENCOUNTER — Emergency Department: Payer: Self-pay | Admitting: Emergency Medicine

## 2014-03-10 DIAGNOSIS — J069 Acute upper respiratory infection, unspecified: Secondary | ICD-10-CM | POA: Diagnosis not present

## 2014-03-22 ENCOUNTER — Ambulatory Visit: Payer: Self-pay | Admitting: Orthopedic Surgery

## 2014-03-22 DIAGNOSIS — M79609 Pain in unspecified limb: Secondary | ICD-10-CM | POA: Diagnosis not present

## 2014-03-22 DIAGNOSIS — M169 Osteoarthritis of hip, unspecified: Secondary | ICD-10-CM | POA: Diagnosis not present

## 2014-03-22 DIAGNOSIS — Z01810 Encounter for preprocedural cardiovascular examination: Secondary | ICD-10-CM | POA: Diagnosis not present

## 2014-03-22 DIAGNOSIS — Z01812 Encounter for preprocedural laboratory examination: Secondary | ICD-10-CM | POA: Diagnosis not present

## 2014-03-22 DIAGNOSIS — M1610 Unilateral primary osteoarthritis, unspecified hip: Secondary | ICD-10-CM | POA: Diagnosis not present

## 2014-03-22 DIAGNOSIS — I10 Essential (primary) hypertension: Secondary | ICD-10-CM | POA: Diagnosis not present

## 2014-03-22 DIAGNOSIS — R0602 Shortness of breath: Secondary | ICD-10-CM | POA: Diagnosis not present

## 2014-03-22 LAB — CBC
HCT: 35.6 % (ref 35.0–47.0)
HGB: 11.6 g/dL — ABNORMAL LOW (ref 12.0–16.0)
MCH: 29.8 pg (ref 26.0–34.0)
MCHC: 32.5 g/dL (ref 32.0–36.0)
MCV: 92 fL (ref 80–100)
Platelet: 412 10*3/uL (ref 150–440)
RBC: 3.88 10*6/uL (ref 3.80–5.20)
RDW: 13.4 % (ref 11.5–14.5)
WBC: 6.3 10*3/uL (ref 3.6–11.0)

## 2014-03-22 LAB — URINALYSIS, COMPLETE
Bilirubin,UR: NEGATIVE
Blood: NEGATIVE
Glucose,UR: NEGATIVE mg/dL (ref 0–75)
Ketone: NEGATIVE
Leukocyte Esterase: NEGATIVE
Nitrite: NEGATIVE
Ph: 5 (ref 4.5–8.0)
Protein: 30
RBC,UR: 2 /HPF (ref 0–5)
Specific Gravity: 1.036 (ref 1.003–1.030)
Squamous Epithelial: 4
WBC UR: 7 /HPF (ref 0–5)

## 2014-03-22 LAB — BASIC METABOLIC PANEL
Anion Gap: 4 — ABNORMAL LOW (ref 7–16)
BUN: 12 mg/dL (ref 7–18)
Calcium, Total: 9.2 mg/dL (ref 8.5–10.1)
Chloride: 105 mmol/L (ref 98–107)
Co2: 30 mmol/L (ref 21–32)
Creatinine: 0.94 mg/dL (ref 0.60–1.30)
EGFR (African American): >60
EGFR (Non-African Amer.): >60
Glucose: 106 mg/dL — ABNORMAL HIGH (ref 65–99)
Osmolality: 278 (ref 275–301)
Potassium: 4.1 mmol/L (ref 3.5–5.1)
Sodium: 139 mmol/L (ref 136–145)

## 2014-03-22 LAB — MRSA PCR SCREENING

## 2014-03-22 LAB — PROTIME-INR
INR: 1
Prothrombin Time: 13.4 secs (ref 11.5–14.7)

## 2014-03-22 LAB — SEDIMENTATION RATE: Erythrocyte Sed Rate: 20 mm/hr (ref 0–30)

## 2014-03-22 LAB — APTT: Activated PTT: 28.6 secs (ref 23.6–35.9)

## 2014-03-24 DIAGNOSIS — I519 Heart disease, unspecified: Secondary | ICD-10-CM | POA: Diagnosis not present

## 2014-03-24 DIAGNOSIS — E785 Hyperlipidemia, unspecified: Secondary | ICD-10-CM | POA: Diagnosis not present

## 2014-03-24 DIAGNOSIS — I2699 Other pulmonary embolism without acute cor pulmonale: Secondary | ICD-10-CM | POA: Diagnosis not present

## 2014-03-24 DIAGNOSIS — I10 Essential (primary) hypertension: Secondary | ICD-10-CM | POA: Diagnosis not present

## 2014-03-28 ENCOUNTER — Ambulatory Visit: Payer: Self-pay | Admitting: Vascular Surgery

## 2014-03-28 DIAGNOSIS — I2699 Other pulmonary embolism without acute cor pulmonale: Secondary | ICD-10-CM | POA: Diagnosis not present

## 2014-03-28 DIAGNOSIS — E785 Hyperlipidemia, unspecified: Secondary | ICD-10-CM | POA: Diagnosis not present

## 2014-03-28 DIAGNOSIS — Z9889 Other specified postprocedural states: Secondary | ICD-10-CM | POA: Diagnosis not present

## 2014-03-28 DIAGNOSIS — M1610 Unilateral primary osteoarthritis, unspecified hip: Secondary | ICD-10-CM | POA: Diagnosis not present

## 2014-03-28 DIAGNOSIS — M4800 Spinal stenosis, site unspecified: Secondary | ICD-10-CM | POA: Diagnosis not present

## 2014-03-28 DIAGNOSIS — Z79899 Other long term (current) drug therapy: Secondary | ICD-10-CM | POA: Diagnosis not present

## 2014-03-28 DIAGNOSIS — Z86718 Personal history of other venous thrombosis and embolism: Secondary | ICD-10-CM | POA: Diagnosis not present

## 2014-03-28 DIAGNOSIS — M169 Osteoarthritis of hip, unspecified: Secondary | ICD-10-CM | POA: Diagnosis not present

## 2014-03-28 DIAGNOSIS — I252 Old myocardial infarction: Secondary | ICD-10-CM | POA: Diagnosis not present

## 2014-03-28 DIAGNOSIS — I82409 Acute embolism and thrombosis of unspecified deep veins of unspecified lower extremity: Secondary | ICD-10-CM | POA: Diagnosis not present

## 2014-04-01 DIAGNOSIS — J189 Pneumonia, unspecified organism: Secondary | ICD-10-CM

## 2014-04-01 HISTORY — DX: Pneumonia, unspecified organism: J18.9

## 2014-04-06 ENCOUNTER — Inpatient Hospital Stay: Payer: Self-pay | Admitting: Orthopedic Surgery

## 2014-04-06 DIAGNOSIS — M6281 Muscle weakness (generalized): Secondary | ICD-10-CM | POA: Diagnosis not present

## 2014-04-06 DIAGNOSIS — Z96649 Presence of unspecified artificial hip joint: Secondary | ICD-10-CM | POA: Diagnosis not present

## 2014-04-06 DIAGNOSIS — R918 Other nonspecific abnormal finding of lung field: Secondary | ICD-10-CM | POA: Diagnosis not present

## 2014-04-06 DIAGNOSIS — D62 Acute posthemorrhagic anemia: Secondary | ICD-10-CM | POA: Diagnosis not present

## 2014-04-06 DIAGNOSIS — F3289 Other specified depressive episodes: Secondary | ICD-10-CM | POA: Diagnosis present

## 2014-04-06 DIAGNOSIS — R509 Fever, unspecified: Secondary | ICD-10-CM | POA: Diagnosis not present

## 2014-04-06 DIAGNOSIS — I10 Essential (primary) hypertension: Secondary | ICD-10-CM | POA: Diagnosis not present

## 2014-04-06 DIAGNOSIS — I499 Cardiac arrhythmia, unspecified: Secondary | ICD-10-CM | POA: Diagnosis not present

## 2014-04-06 DIAGNOSIS — E785 Hyperlipidemia, unspecified: Secondary | ICD-10-CM | POA: Diagnosis present

## 2014-04-06 DIAGNOSIS — I4949 Other premature depolarization: Secondary | ICD-10-CM | POA: Diagnosis not present

## 2014-04-06 DIAGNOSIS — M169 Osteoarthritis of hip, unspecified: Secondary | ICD-10-CM | POA: Diagnosis not present

## 2014-04-06 DIAGNOSIS — M62838 Other muscle spasm: Secondary | ICD-10-CM | POA: Diagnosis not present

## 2014-04-06 DIAGNOSIS — A419 Sepsis, unspecified organism: Secondary | ICD-10-CM | POA: Diagnosis not present

## 2014-04-06 DIAGNOSIS — R279 Unspecified lack of coordination: Secondary | ICD-10-CM | POA: Diagnosis not present

## 2014-04-06 DIAGNOSIS — R5381 Other malaise: Secondary | ICD-10-CM | POA: Diagnosis not present

## 2014-04-06 DIAGNOSIS — D649 Anemia, unspecified: Secondary | ICD-10-CM | POA: Diagnosis not present

## 2014-04-06 DIAGNOSIS — I498 Other specified cardiac arrhythmias: Secondary | ICD-10-CM | POA: Diagnosis not present

## 2014-04-06 DIAGNOSIS — Z885 Allergy status to narcotic agent status: Secondary | ICD-10-CM | POA: Diagnosis not present

## 2014-04-06 DIAGNOSIS — J189 Pneumonia, unspecified organism: Secondary | ICD-10-CM | POA: Diagnosis not present

## 2014-04-06 DIAGNOSIS — F329 Major depressive disorder, single episode, unspecified: Secondary | ICD-10-CM | POA: Diagnosis present

## 2014-04-06 DIAGNOSIS — Z5189 Encounter for other specified aftercare: Secondary | ICD-10-CM | POA: Diagnosis not present

## 2014-04-06 DIAGNOSIS — Z471 Aftercare following joint replacement surgery: Secondary | ICD-10-CM | POA: Diagnosis not present

## 2014-04-06 DIAGNOSIS — R51 Headache: Secondary | ICD-10-CM | POA: Diagnosis present

## 2014-04-06 DIAGNOSIS — Z881 Allergy status to other antibiotic agents status: Secondary | ICD-10-CM | POA: Diagnosis not present

## 2014-04-06 DIAGNOSIS — M1610 Unilateral primary osteoarthritis, unspecified hip: Secondary | ICD-10-CM | POA: Diagnosis present

## 2014-04-06 DIAGNOSIS — G4700 Insomnia, unspecified: Secondary | ICD-10-CM | POA: Diagnosis not present

## 2014-04-06 DIAGNOSIS — Z86718 Personal history of other venous thrombosis and embolism: Secondary | ICD-10-CM | POA: Diagnosis not present

## 2014-04-06 DIAGNOSIS — R262 Difficulty in walking, not elsewhere classified: Secondary | ICD-10-CM | POA: Diagnosis not present

## 2014-04-06 DIAGNOSIS — Z87891 Personal history of nicotine dependence: Secondary | ICD-10-CM | POA: Diagnosis not present

## 2014-04-06 DIAGNOSIS — E119 Type 2 diabetes mellitus without complications: Secondary | ICD-10-CM | POA: Diagnosis present

## 2014-04-06 DIAGNOSIS — Z86711 Personal history of pulmonary embolism: Secondary | ICD-10-CM | POA: Diagnosis not present

## 2014-04-06 DIAGNOSIS — K589 Irritable bowel syndrome without diarrhea: Secondary | ICD-10-CM | POA: Diagnosis not present

## 2014-04-06 DIAGNOSIS — K219 Gastro-esophageal reflux disease without esophagitis: Secondary | ICD-10-CM | POA: Diagnosis not present

## 2014-04-06 HISTORY — PX: TOTAL HIP ARTHROPLASTY: SHX124

## 2014-04-06 LAB — HEMOGLOBIN: HGB: 9.7 g/dL — ABNORMAL LOW (ref 12.0–16.0)

## 2014-04-07 LAB — PLATELET COUNT: Platelet: 230 10*3/uL (ref 150–440)

## 2014-04-07 LAB — BASIC METABOLIC PANEL
Anion Gap: 7 (ref 7–16)
BUN: 10 mg/dL (ref 7–18)
Calcium, Total: 8 mg/dL — ABNORMAL LOW (ref 8.5–10.1)
Chloride: 105 mmol/L (ref 98–107)
Co2: 28 mmol/L (ref 21–32)
Creatinine: 0.98 mg/dL (ref 0.60–1.30)
EGFR (African American): >60
EGFR (Non-African Amer.): >60
Glucose: 121 mg/dL — ABNORMAL HIGH (ref 65–99)
Osmolality: 280 (ref 275–301)
Potassium: 4 mmol/L (ref 3.5–5.1)
Sodium: 140 mmol/L (ref 136–145)

## 2014-04-07 LAB — HEMOGLOBIN: HGB: 8.9 g/dL — ABNORMAL LOW (ref 12.0–16.0)

## 2014-04-09 LAB — CBC WITH DIFFERENTIAL/PLATELET
Basophil #: 0 10*3/uL (ref 0.0–0.1)
Basophil %: 0.2 %
Eosinophil #: 0.1 10*3/uL (ref 0.0–0.7)
Eosinophil %: 0.7 %
HCT: 23.6 % — ABNORMAL LOW (ref 35.0–47.0)
HGB: 7.7 g/dL — ABNORMAL LOW (ref 12.0–16.0)
Lymphocyte #: 1.6 10*3/uL (ref 1.0–3.6)
Lymphocyte %: 14.9 %
MCH: 29.6 pg (ref 26.0–34.0)
MCHC: 32.4 g/dL (ref 32.0–36.0)
MCV: 91 fL (ref 80–100)
Monocyte #: 0.7 x10 3/mm (ref 0.2–0.9)
Monocyte %: 6.3 %
Neutrophil #: 8.6 10*3/uL — ABNORMAL HIGH (ref 1.4–6.5)
Neutrophil %: 77.9 %
Platelet: 229 10*3/uL (ref 150–440)
RBC: 2.59 10*6/uL — ABNORMAL LOW (ref 3.80–5.20)
RDW: 13.7 % (ref 11.5–14.5)
WBC: 11.1 10*3/uL — ABNORMAL HIGH (ref 3.6–11.0)

## 2014-04-09 LAB — URINE CULTURE

## 2014-04-10 ENCOUNTER — Other Ambulatory Visit: Payer: Self-pay | Admitting: *Deleted

## 2014-04-10 DIAGNOSIS — T8140XA Infection following a procedure, unspecified, initial encounter: Secondary | ICD-10-CM | POA: Diagnosis not present

## 2014-04-10 DIAGNOSIS — R300 Dysuria: Secondary | ICD-10-CM | POA: Diagnosis not present

## 2014-04-10 DIAGNOSIS — Z96649 Presence of unspecified artificial hip joint: Secondary | ICD-10-CM | POA: Diagnosis not present

## 2014-04-10 DIAGNOSIS — K589 Irritable bowel syndrome without diarrhea: Secondary | ICD-10-CM | POA: Diagnosis not present

## 2014-04-10 DIAGNOSIS — F341 Dysthymic disorder: Secondary | ICD-10-CM | POA: Diagnosis not present

## 2014-04-10 DIAGNOSIS — R5381 Other malaise: Secondary | ICD-10-CM | POA: Diagnosis not present

## 2014-04-10 DIAGNOSIS — K5900 Constipation, unspecified: Secondary | ICD-10-CM | POA: Diagnosis not present

## 2014-04-10 DIAGNOSIS — R262 Difficulty in walking, not elsewhere classified: Secondary | ICD-10-CM | POA: Diagnosis not present

## 2014-04-10 DIAGNOSIS — K219 Gastro-esophageal reflux disease without esophagitis: Secondary | ICD-10-CM | POA: Diagnosis not present

## 2014-04-10 DIAGNOSIS — R5383 Other fatigue: Secondary | ICD-10-CM | POA: Diagnosis not present

## 2014-04-10 DIAGNOSIS — I10 Essential (primary) hypertension: Secondary | ICD-10-CM | POA: Diagnosis not present

## 2014-04-10 DIAGNOSIS — G4700 Insomnia, unspecified: Secondary | ICD-10-CM | POA: Diagnosis not present

## 2014-04-10 DIAGNOSIS — Z5189 Encounter for other specified aftercare: Secondary | ICD-10-CM | POA: Diagnosis not present

## 2014-04-10 DIAGNOSIS — M62838 Other muscle spasm: Secondary | ICD-10-CM | POA: Diagnosis not present

## 2014-04-10 DIAGNOSIS — N390 Urinary tract infection, site not specified: Secondary | ICD-10-CM | POA: Diagnosis not present

## 2014-04-10 DIAGNOSIS — R279 Unspecified lack of coordination: Secondary | ICD-10-CM | POA: Diagnosis not present

## 2014-04-10 DIAGNOSIS — M1610 Unilateral primary osteoarthritis, unspecified hip: Secondary | ICD-10-CM | POA: Diagnosis not present

## 2014-04-10 DIAGNOSIS — D649 Anemia, unspecified: Secondary | ICD-10-CM | POA: Diagnosis not present

## 2014-04-10 DIAGNOSIS — J189 Pneumonia, unspecified organism: Secondary | ICD-10-CM | POA: Diagnosis not present

## 2014-04-10 DIAGNOSIS — A419 Sepsis, unspecified organism: Secondary | ICD-10-CM | POA: Diagnosis not present

## 2014-04-10 DIAGNOSIS — Z471 Aftercare following joint replacement surgery: Secondary | ICD-10-CM | POA: Diagnosis not present

## 2014-04-10 DIAGNOSIS — M6281 Muscle weakness (generalized): Secondary | ICD-10-CM | POA: Diagnosis not present

## 2014-04-10 LAB — PATHOLOGY REPORT

## 2014-04-10 LAB — HEMOGLOBIN: HGB: 8.1 g/dL — ABNORMAL LOW (ref 12.0–16.0)

## 2014-04-10 MED ORDER — OXYCODONE HCL 5 MG PO TABS: ORAL_TABLET | ORAL | Status: DC

## 2014-04-10 NOTE — Telephone Encounter (Signed)
[facility] 

## 2014-04-11 ENCOUNTER — Encounter: Payer: Self-pay | Admitting: Adult Health

## 2014-04-11 ENCOUNTER — Non-Acute Institutional Stay (SKILLED_NURSING_FACILITY): Payer: Medicare Other | Admitting: Adult Health

## 2014-04-11 DIAGNOSIS — Z96642 Presence of left artificial hip joint: Secondary | ICD-10-CM | POA: Insufficient documentation

## 2014-04-11 DIAGNOSIS — D649 Anemia, unspecified: Secondary | ICD-10-CM

## 2014-04-11 DIAGNOSIS — M1610 Unilateral primary osteoarthritis, unspecified hip: Secondary | ICD-10-CM

## 2014-04-11 DIAGNOSIS — Z96649 Presence of unspecified artificial hip joint: Secondary | ICD-10-CM

## 2014-04-11 DIAGNOSIS — K219 Gastro-esophageal reflux disease without esophagitis: Secondary | ICD-10-CM

## 2014-04-11 DIAGNOSIS — K589 Irritable bowel syndrome without diarrhea: Secondary | ICD-10-CM | POA: Insufficient documentation

## 2014-04-11 DIAGNOSIS — G4700 Insomnia, unspecified: Secondary | ICD-10-CM

## 2014-04-11 DIAGNOSIS — I10 Essential (primary) hypertension: Secondary | ICD-10-CM | POA: Diagnosis not present

## 2014-04-11 DIAGNOSIS — K5900 Constipation, unspecified: Secondary | ICD-10-CM | POA: Insufficient documentation

## 2014-04-11 DIAGNOSIS — M1612 Unilateral primary osteoarthritis, left hip: Secondary | ICD-10-CM

## 2014-04-11 NOTE — Progress Notes (Signed)
Patient ID: Valerie Vazquez, female   DOB: Jan 02, 1956, 58 y.o.   MRN: [id number]     [facility]  Allergies  Allergen Reactions  . Abilify [Aripiprazole] Other (See Comments)    Motor skills   . Bactrim [Sulfamethoxazole-Tmp Ds] Diarrhea  . Ceftin [Cefuroxime Axetil] Diarrhea  . Indocin [Indomethacin] Other (See Comments)    Migraines   . Lisinopril Diarrhea and Other (See Comments)  . Morphine And Related Other (See Comments)    Chest tightness   . Penicillins Itching  . Percocet [Oxycodone-Acetaminophen] Itching  . Wellbutrin [Bupropion] Other (See Comments)    Slurred speech     Chief Complaint  Patient presents with  . Hospitalization Follow-up    HPI:  She has been hospitalized for a left hip replacement due to arthritis. She is here for short term rehab and will return back home. She does muscle spasms present. We have discussed this issue and will make her flexeril every 8 hours in order to better meet her needs.    Past Medical History  Diagnosis Date  . Arthritis   . Spinal stenosis   . Spondylosis   . History of neck surgery 10/28/2013    Past Surgical History  Procedure Laterality Date  . Tonsilectomy, adenoidectomy, bilateral myringotomy and tubes    . Abdominal hysterectomy    . Hernia repair    . Excision/release bursa hip    . Appendectomy    . Cholecystectomy    . Left hip replacement  04-06-2014    VITAL SIGNS BP 119/80  Pulse 78  Ht 5\' 8"  (1.727 m)  Wt 200 lb (90.719 kg)  BMI 30.42 kg/m2   Patient's Medications  New Prescriptions   No medications on file  Previous Medications   CYCLOBENZAPRINE (FLEXERIL) 5 MG TABLET    Take 10 mg by mouth 3 (three) times daily.    DICYCLOMINE (BENTYL) 10 MG CAPSULE    Take 10 mg by mouth 4 (four) times daily -  before meals and at bedtime.   DILTIAZEM (CARDIZEM CD) 120 MG 24 HR CAPSULE    Take 120 mg by mouth daily.   DOCUSATE SODIUM (COLACE) 100 MG CAPSULE    Take 100 mg by mouth 2 (two) times  daily.   FERROUS SULFATE 325 (65 FE) MG TABLET    Take 325 mg by mouth 2 (two) times daily with a meal.   GABAPENTIN (NEURONTIN) 600 MG TABLET    Take 600 mg by mouth 3 (three) times daily.   OXYCODONE (ROXICODONE) 5 MG IMMEDIATE RELEASE TABLET    Take one tablet by mouth every 4 hours as needed for moderate pain (4-6/10); Take two tablets by mouth for severe pain every 4 hours as needed for pain   PANTOPRAZOLE (PROTONIX) 40 MG TABLET    Take 40 mg by mouth daily.   SENNA (SENOKOT) 8.6 MG TABLET    Take 1 tablet by mouth 2 (two) times daily.    TRAZODONE (DESYREL) 100 MG TABLET    Take 50 mg by mouth at bedtime.  Modified Medications   No medications on file  Discontinued Medications   ACETAMINOPHEN-CODEINE (TYLENOL #3) 300-30 MG PER TABLET    Take 1 tablet by mouth every 4 (four) hours as needed for moderate pain.   ASPIRIN-CAFFEINE (BAYER BACK & BODY PAIN EX ST) 500-32.5 MG TABS    Take 1 tablet by mouth every 6 (six) hours as needed (pain).   DILTIAZEM (CARDIZEM) 120 MG TABLET  Take 120 mg by mouth 4 (four) times daily.   DIPHENHYDRAMINE (SOMINEX) 25 MG TABLET    Take 25 mg by mouth at bedtime as needed for sleep.   LIDOCAINE (LIDODERM) 5 %    Place 1 patch onto the skin daily. Remove & Discard patch within 12 hours or as directed by MD   NAPROXEN SODIUM (ANAPROX) 220 MG TABLET    Take 220 mg by mouth 2 (two) times daily with a meal.   PRAVASTATIN (PRAVACHOL) 40 MG TABLET    Take 40 mg by mouth daily.   TROLAMINE SALICYLATE (ASPERCREME) 10 % CREAM    Apply 1 application topically as needed for muscle pain.    SIGNIFICANT DIAGNOSTIC EXAMS  12-31-13: left hip x-ray: severe djd 04-06-14: left hip x-ray: left hip prosthesis well seated no fracture or dislocation   LABS REVIEWED:   04-06-14: hgb 9.7 04-07-14: hgb 8.9     Review of Systems  Constitutional: Negative for malaise/fatigue.  Respiratory: Negative for cough and shortness of breath.   Cardiovascular: Negative for chest pain,  palpitations and leg swelling.  Gastrointestinal: Negative for heartburn, abdominal pain and constipation.  Musculoskeletal: Positive for joint pain and myalgias.       Due to joint replacement  Skin: Negative.   Psychiatric/Behavioral: Negative for depression. The patient is not nervous/anxious.      Physical Exam  Constitutional: She is oriented to person, place, and time. She appears well-developed and well-nourished. No distress.  Overweight   Neck: Neck supple. No JVD present.  Cardiovascular: Normal rate, regular rhythm and intact distal pulses.   Respiratory: Effort normal and breath sounds normal. No respiratory distress. She has no wheezes.  GI: Soft. Bowel sounds are normal. She exhibits no distension. There is no tenderness.  Musculoskeletal: She exhibits no edema.  Is able to move all extremities; is status post left hip replacement   Neurological: She is alert and oriented to person, place, and time.  Skin: Skin is warm and dry. She is not diaphoretic.  Incision line without signs of infection present   Psychiatric: She has a normal mood and affect.     ASSESSMENT/ PLAN:  1. 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   2.  Hypertension: is stable will continue cardizem cd 120 mg daily and will monitor  3. IBS: she is presently stable will continue bentyl 10 mg four times daily and will continue protonix 40 mg daily for her gerd.   4. Anemia: will continue iron twice daily   5. Constipation: will continue colace twice daily and senna twice daily   6. Insomnia: will continue trazodone 50 mg nightly     Time spent with patient 50 minutes       Ok Edwards NP [facility]  Contact  [phone] Monday through Friday 8am- 5pm  After hours call [phone]

## 2014-04-12 ENCOUNTER — Non-Acute Institutional Stay (SKILLED_NURSING_FACILITY): Payer: Medicare Other | Admitting: Adult Health

## 2014-04-12 DIAGNOSIS — Z96649 Presence of unspecified artificial hip joint: Secondary | ICD-10-CM | POA: Diagnosis not present

## 2014-04-12 DIAGNOSIS — Z96642 Presence of left artificial hip joint: Secondary | ICD-10-CM

## 2014-04-12 DIAGNOSIS — M1610 Unilateral primary osteoarthritis, unspecified hip: Secondary | ICD-10-CM | POA: Diagnosis not present

## 2014-04-12 DIAGNOSIS — K5900 Constipation, unspecified: Secondary | ICD-10-CM

## 2014-04-12 DIAGNOSIS — M1612 Unilateral primary osteoarthritis, left hip: Secondary | ICD-10-CM

## 2014-04-12 LAB — CULTURE, BLOOD (SINGLE)

## 2014-04-12 MED ORDER — OXYCODONE HCL 5 MG PO TABS: 5.0000 mg | ORAL_TABLET | ORAL | Status: DC

## 2014-04-12 MED ORDER — SENNOSIDES-DOCUSATE SODIUM 8.6-50 MG PO TABS: 2.0000 | ORAL_TABLET | Freq: Two times a day (BID) | ORAL | Status: DC

## 2014-04-12 NOTE — Progress Notes (Signed)
Patient ID: Valerie Vazquez, female   DOB: [birth year], 58 y.o.   MRN: [id number]     Allergies  Allergen Reactions  . Abilify [Aripiprazole] Other (See Comments)    Motor skills   . Bactrim [Sulfamethoxazole-Tmp Ds] Diarrhea  . Ceftin [Cefuroxime Axetil] Diarrhea  . Indocin [Indomethacin] Other (See Comments)    Migraines   . Lisinopril Diarrhea and Other (See Comments)  . Morphine And Related Other (See Comments)    Chest tightness   . Penicillins Itching  . Percocet [Oxycodone-Acetaminophen] Itching  . Wellbutrin [Bupropion] Other (See Comments)    Slurred speech     Chief Complaint  Patient presents with  . Acute Visit    patient concerns     HPI:  The dressing to her incision line has drainage present. She is having increased edema present and states she feels as though the staples are "stretched".  She is worried about the incision line. She is having increased pain today. She is worried that her muscles in her left thigh were injured from her surgery and immediate post op period. We have spent an extended period of time regarding her status and pain management.   Past Medical History  Diagnosis Date  . Arthritis   . Spinal stenosis   . Spondylosis   . History of neck surgery 10/28/2013    Past Surgical History  Procedure Laterality Date  . Tonsilectomy, adenoidectomy, bilateral myringotomy and tubes    . Abdominal hysterectomy    . Hernia repair    . Excision/release bursa hip    . Appendectomy    . Cholecystectomy    . Left hip replacement  04-06-2014    VITAL SIGNS BP 98/56  Pulse 75  Ht 5\' 8"  (1.727 m)  Wt 200 lb (90.719 kg)  BMI 30.42 kg/m2   Patient's Medications  New Prescriptions   No medications on file  Previous Medications   CYCLOBENZAPRINE (FLEXERIL) 5 MG TABLET    Take 10 mg by mouth 3 (three) times daily.    DICYCLOMINE (BENTYL) 10 MG CAPSULE    Take 10 mg by mouth 4 (four) times daily -  before meals and at bedtime.   DILTIAZEM  (CARDIZEM CD) 120 MG 24 HR CAPSULE    Take 120 mg by mouth daily.   DOCUSATE SODIUM (COLACE) 100 MG CAPSULE    Take 100 mg by mouth 2 (two) times daily.   FERROUS SULFATE 325 (65 FE) MG TABLET    Take 325 mg by mouth 2 (two) times daily with a meal.   GABAPENTIN (NEURONTIN) 600 MG TABLET    Take 600 mg by mouth 3 (three) times daily.   OXYCODONE (ROXICODONE) 5 MG IMMEDIATE RELEASE TABLET    Take one tablet by mouth every 4 hours as needed for moderate pain (4-6/10); Take two tablets by mouth for severe pain every 4 hours as needed for pain   PANTOPRAZOLE (PROTONIX) 40 MG TABLET    Take 40 mg by mouth daily.   SENNA (SENOKOT) 8.6 MG TABLET    Take 1 tablet by mouth 2 (two) times daily.    TRAZODONE (DESYREL) 100 MG TABLET    Take 50 mg by mouth at bedtime.  Modified Medications   No medications on file  Discontinued Medications   No medications on file    SIGNIFICANT DIAGNOSTIC EXAMS   12-31-13: left hip x-ray: severe djd 04-06-14: left hip x-ray: left hip prosthesis well seated no fracture or dislocation   LABS REVIEWED:  04-06-14: hgb 9.7 04-07-14: hgb 8.9     Review of Systems  Constitutional: Negative for malaise/fatigue.  Respiratory: Negative for cough and shortness of breath.   Cardiovascular: Negative for chest pain, palpitations has swelling on her left thigh and left lower leg   Gastrointestinal: Negative for heartburn, abdominal pain and constipation.  Musculoskeletal: Positive for joint pain and myalgias.       Due to joint replacement  Skin: Negative.   Psychiatric/Behavioral: Negative for depression. The patient is not nervous/anxious.      Physical Exam  Constitutional: She is oriented to person, place, and time. She appears well-developed and well-nourished. No distress.  Overweight   Neck: Neck supple. No JVD present.  Cardiovascular: Normal rate, regular rhythm and intact distal pulses.   Respiratory: Effort normal and breath sounds normal. No respiratory  distress. She has no wheezes.  GI: Soft. Bowel sounds are normal. She exhibits no distension. There is no tenderness.  Musculoskeletal: She exhibits 1-2+ edema on the left lower extremity  Is able to move all extremities; is status post left hip replacement   Neurological: She is alert and oriented to person, place, and time.  Skin: Skin is warm and dry. She is not diaphoretic.  Incision line: staples without signs of infection present; has a pinpoint open area on the proximal incision line there is scant drainage present.   Psychiatric: She has a normal mood and affect.      ASSESSMENT/ PLAN:  Left hip osteoarthritis Status post left hip replacement Constipation  Will stop the colace and senna and will begin senna s 2 tabs twice daily; will give her a dulcolax supp now and daily as needed; will begin zofran 4 mg every 6 hours as needed  Will check cbc in am  Will change her oxycodone 5 mg every 4 hours with 5 mg every 2 hours as needed for break through pain and will continue to monitor her status.   Will change her dressing daily and apply bacitracin oint daily to her incision line      Time spent with patient 50 minutes.

## 2014-04-13 ENCOUNTER — Other Ambulatory Visit: Payer: Self-pay | Admitting: *Deleted

## 2014-04-13 ENCOUNTER — Non-Acute Institutional Stay (SKILLED_NURSING_FACILITY): Payer: Medicare Other | Admitting: Internal Medicine

## 2014-04-13 ENCOUNTER — Encounter: Payer: Self-pay | Admitting: Internal Medicine

## 2014-04-13 DIAGNOSIS — Z96642 Presence of left artificial hip joint: Secondary | ICD-10-CM

## 2014-04-13 DIAGNOSIS — F329 Major depressive disorder, single episode, unspecified: Secondary | ICD-10-CM

## 2014-04-13 DIAGNOSIS — K219 Gastro-esophageal reflux disease without esophagitis: Secondary | ICD-10-CM | POA: Diagnosis not present

## 2014-04-13 DIAGNOSIS — F419 Anxiety disorder, unspecified: Secondary | ICD-10-CM | POA: Insufficient documentation

## 2014-04-13 DIAGNOSIS — K589 Irritable bowel syndrome without diarrhea: Secondary | ICD-10-CM

## 2014-04-13 DIAGNOSIS — G4700 Insomnia, unspecified: Secondary | ICD-10-CM

## 2014-04-13 DIAGNOSIS — M1610 Unilateral primary osteoarthritis, unspecified hip: Secondary | ICD-10-CM | POA: Diagnosis not present

## 2014-04-13 DIAGNOSIS — Z96649 Presence of unspecified artificial hip joint: Secondary | ICD-10-CM

## 2014-04-13 DIAGNOSIS — I10 Essential (primary) hypertension: Secondary | ICD-10-CM

## 2014-04-13 DIAGNOSIS — K5900 Constipation, unspecified: Secondary | ICD-10-CM | POA: Diagnosis not present

## 2014-04-13 DIAGNOSIS — D649 Anemia, unspecified: Secondary | ICD-10-CM

## 2014-04-13 DIAGNOSIS — F32A Depression, unspecified: Secondary | ICD-10-CM | POA: Insufficient documentation

## 2014-04-13 DIAGNOSIS — F341 Dysthymic disorder: Secondary | ICD-10-CM

## 2014-04-13 DIAGNOSIS — M1612 Unilateral primary osteoarthritis, left hip: Secondary | ICD-10-CM

## 2014-04-13 NOTE — Progress Notes (Signed)
Patient ID: Valerie Vazquez, female   DOB: [birth year], 58 y.o.   MRN: [id number]     Facility: [facility]    PCP: Angelica Chessman, MD  Code Status: full code  Allergies  Allergen Reactions  . Abilify [Aripiprazole] Other (See Comments)    Motor skills   . Bactrim [Sulfamethoxazole-Tmp Ds] Diarrhea  . Ceftin [Cefuroxime Axetil] Diarrhea  . Indocin [Indomethacin] Other (See Comments)    Migraines   . Lisinopril Diarrhea and Other (See Comments)  . Morphine And Related Other (See Comments)    Chest tightness   . Penicillins Itching  . Percocet [Oxycodone-Acetaminophen] Itching  . Wellbutrin [Bupropion] Other (See Comments)    Slurred speech    Chief Complaint: new admit  HPI:  58 y/o female patient is here for STR after hospital admission for left hip replacement with her severe OA. She is seen in her room with her husband present. She is tearful this visit and mentions being nervous about a staff being fired from [facility] because of her reporting about the person. Now she feels that this person will be out to harm her and this is getting her paranoid and anxious. She feels low and is sad that nothing is going right in her life. She would like her husband to stay with her at night time if possible. Her pain is not under control and she also has been having muscle spasms No concerns from staff  Review of Systems:  Constitutional: Negative for fever, chills, weight loss, malaise/fatigue and diaphoresis.  HENT: Negative for congestion, hearing loss and sore throat.   Eyes: Negative for eye pain, blurred vision, double vision and discharge.  Respiratory: Negative for cough, sputum production, shortness of breath and wheezing.   Cardiovascular: Negative for chest pain, palpitations, orthopnea and leg swelling.  Gastrointestinal: Negative for heartburn, nausea, vomiting, abdominal pain. Has constipation. Had suppository placed yesterday and had a bowel movement  following that Genitourinary: Negative for dysuria, urgency, frequency, hematuria and flank pain.  Musculoskeletal: Negative for back pain, falls Skin: Negative for itching and rash.  Neurological: Negative for dizziness, tingling, focal weakness and headaches.  Psychiatric/Behavioral: Negative for memory loss.    Past Medical History  Diagnosis Date  . Arthritis   . Spinal stenosis   . Spondylosis   . History of neck surgery 10/28/2013   Past Surgical History  Procedure Laterality Date  . Tonsilectomy, adenoidectomy, bilateral myringotomy and tubes    . Abdominal hysterectomy    . Hernia repair    . Excision/release bursa hip    . Appendectomy    . Cholecystectomy    . Left hip replacement  04-06-2014   Social History:   reports that she quit smoking about 35 years ago. She does not have any smokeless tobacco history on file. She reports that she does not drink alcohol or use illicit drugs.  No family history on file.  Medications: Patient's Medications  New Prescriptions   No medications on file  Previous Medications   CYCLOBENZAPRINE (FLEXERIL) 5 MG TABLET    Take 10 mg by mouth 3 (three) times daily.    DICYCLOMINE (BENTYL) 10 MG CAPSULE    Take 10 mg by mouth 4 (four) times daily -  before meals and at bedtime.   DILTIAZEM (CARDIZEM CD) 120 MG 24 HR CAPSULE    Take 120 mg by mouth daily.   FERROUS SULFATE 325 (65 FE) MG TABLET    Take 325 mg by mouth 2 (  two) times daily with a meal.   GABAPENTIN (NEURONTIN) 600 MG TABLET    Take 600 mg by mouth 3 (three) times daily.   OXYCODONE (ROXICODONE) 5 MG IMMEDIATE RELEASE TABLET    Take 1 tablet (5 mg total) by mouth every 4 (four) hours. Also take 5 mg every 2 hours as needed for break through pain   PANTOPRAZOLE (PROTONIX) 40 MG TABLET    Take 40 mg by mouth daily.   SENNA-DOCUSATE (SENOKOT-S) 8.6-50 MG PER TABLET    Take 2 tablets by mouth 2 (two) times daily.   TRAZODONE (DESYREL) 100 MG TABLET    Take 50 mg by mouth at  bedtime.  Modified Medications   No medications on file  Discontinued Medications   No medications on file     Physical Exam: Filed Vitals:   04/13/14 1735  BP: 131/85  Pulse: 88  Temp: 99 F (37.2 C)  Resp: 18  SpO2: 96%   General- elderly female in no acute distress Head- atraumatic, normocephalic Eyes- mild pallor, no icterus, no discharge Neck- no lymphadenopathy Throat- moist mucus membrane, normal oropharynx Cardiovascular- normal s1,s2, no murmurs Respiratory- bilateral clear to auscultation, no wheeze, no rhonchi, no crackles, no use of accessory muscles Abdomen- bowel sounds present, soft, non tender Musculoskeletal- able to move all 4 extremities, left hip ROM limited, muscle spastic, trace left leg edema Neurological- no focal deficit, aao x 3 Skin- warm and dry, staples in place, a pin point opening on beginning of incision with skin area denuded, no signs of infection Psychiatry- alert and oriented to person, place and time, anxious, tearful   Labs reviewed: Basic Metabolic Panel:  Recent Labs  09/24/13 1525  NA 143  K 4.6  CL 104  CO2 28  GLUCOSE 86  BUN 12  CREATININE 0.74  CALCIUM 9.3   CBC:  Recent Labs  09/24/13 1525  WBC 7.0  HGB 10.2*  HCT 29.9*  MCV 87.9  PLT 370   04-06-14: hgb 9.7 04-07-14: hgb 8.9 04-13-14 wbc 7.8, hb 7.5, hct 23.6, plt 446  Radiological Exams: 12-31-13: left hip x-ray: severe djd 04-06-14: left hip x-ray: left hip prosthesis well seated no fracture or dislocation   Assessment/Plan  Left hip OA S/p left hip arthroplasty. Pain is not under control. Will change oxycodone to 5-10 mg every 4 hour for pain 1-5 and 6-10 respectively. Continue current breakthrough regimen. Will change flexeril to 10 mg qid for now and reassess. Will have patient work with PT/OT as tolerated to regain strength and restore function.  Fall precautions are in place. Continue neurontin. Continue enoxaparin for dvt  prophylaxis  Constipation Persists. continue senna s 2 tab bid and add miralax 17 g daily for now. Continue prn suppository  Anemia Likely from blood loss post op. Monitor cbc, recheck 04/17/14. Change iron to 325 mg bid for now. Pt is on enoxaparin, monitor cbc closely  Anxiety and depression With her anxiety and depression, will have her on zoloft 25 mg daily for now and reassess  Hypertension continue cardizem cd 120 mg daily   IBS continue bentyl 10 mg four times daily   GERD continue protonix 40 mg daily   Insomnia continue trazodone 50 mg nightly    Family/ staff Communication: reviewed care plan with patient and nursing supervisor   Goals of care: short term rehabilitation   Labs/tests ordered- cbc, bmp 8/17    Blanchie Serve, MD  [facility] [phone] (Monday-Friday 8 am - 5 pm)  [phone] (afterhours)

## 2014-04-17 ENCOUNTER — Non-Acute Institutional Stay (SKILLED_NURSING_FACILITY): Payer: Medicare Other | Admitting: Internal Medicine

## 2014-04-17 ENCOUNTER — Other Ambulatory Visit: Payer: Self-pay

## 2014-04-17 DIAGNOSIS — R300 Dysuria: Secondary | ICD-10-CM

## 2014-04-17 DIAGNOSIS — Z96642 Presence of left artificial hip joint: Secondary | ICD-10-CM

## 2014-04-17 DIAGNOSIS — M1612 Unilateral primary osteoarthritis, left hip: Secondary | ICD-10-CM

## 2014-04-17 DIAGNOSIS — IMO0001 Reserved for inherently not codable concepts without codable children: Secondary | ICD-10-CM

## 2014-04-17 DIAGNOSIS — T8140XA Infection following a procedure, unspecified, initial encounter: Secondary | ICD-10-CM

## 2014-04-17 DIAGNOSIS — T814XXA Infection following a procedure, initial encounter: Principal | ICD-10-CM

## 2014-04-17 MED ORDER — OXYCODONE HCL 5 MG PO TABS: ORAL_TABLET | ORAL | Status: DC

## 2014-04-17 NOTE — Telephone Encounter (Signed)
Rx faxed to [facility] @ [phone], phone number [phone]  

## 2014-04-17 NOTE — Progress Notes (Signed)
Patient ID: Valerie Vazquez, female   DOB: 1956/05/23, 58 y.o.   MRN: [id number]    Facility: [facility]   Chief Complaint  Patient presents with  . Acute Visit    left groin pain   HPI 58 y/o female pt seen today for AV. She is s/p left hip replacement here for STR. She complaints of pain in her left groin area since yesterday. The pain is non radiating, intermittent and different from pain in her hip area. Denies any fever or chills. Also complaints of discomfort with urination. Denies flank pain. Her muscle spasm and hip pain are under better control. Her mood appears better today  ROS No chest pain or dyspnea Sleeping better Mood better Calm this visit Taking her pain and muscle relaxant medications  Past Medical History  Diagnosis Date  . Arthritis   . Spinal stenosis   . Spondylosis   . History of neck surgery 10/28/2013   Medication reviewed. See MAR  Allergies  Allergen Reactions  . Abilify [Aripiprazole] Other (See Comments)    Motor skills   . Bactrim [Sulfamethoxazole-Tmp Ds] Diarrhea  . Ceftin [Cefuroxime Axetil] Diarrhea  . Indocin [Indomethacin] Other (See Comments)    Migraines   . Lisinopril Diarrhea and Other (See Comments)  . Morphine And Related Other (See Comments)    Chest tightness   . Penicillins Itching  . Percocet [Oxycodone-Acetaminophen] Itching  . Wellbutrin [Bupropion] Other (See Comments)    Slurred speech   Physical exam BP 138/74  Pulse 88  Temp(Src) 98 F (36.7 C)  Resp 19  General- elderly female in no acute distress Head- atraumatic, normocephalic Eyes- mild pallor, no icterus, no discharge Neck- no lymphadenopathy Throat- moist mucus membrane, normal oropharynx Cardiovascular- normal s1,s2, no murmurs Respiratory- bilateral clear to auscultation, no wheeze, no rhonchi, no crackles, no use of accessory muscles Abdomen- bowel sounds present, soft, enlarged left inguinal lymph node which is tender to  palpation, good femoral pulse, no hernia, no cva tenderness or suprapubic tenderness Musculoskeletal- able to move all 4 extremities, left hip ROM limited, trace left leg edema Neurological- no focal deficit, aao x 3 Skin- warm and dry, staples in place, a pin point opening on beginning of incision with greenish/yellow drainage and skin area around it denuded with mild erythema Psychiatry- alert and oriented to person, place and time, calm and mood appropriate  Assessment/plan  Incision site infection Will send wound swab for culture study. Will have her empirically started on doxycycline 100 mg bid for a week for now and also have her on florastor. Continue dressing change  Dysuria With new onset dysuria will send u/a and c/s. Doxycycline should provide coverage. Change antibiotics if culture report shows bacterial growth. Encouraged hydration for now. Monitor temperature curve

## 2014-04-25 ENCOUNTER — Non-Acute Institutional Stay (SKILLED_NURSING_FACILITY): Payer: Medicare Other | Admitting: Adult Health

## 2014-04-25 DIAGNOSIS — Z96649 Presence of unspecified artificial hip joint: Secondary | ICD-10-CM | POA: Diagnosis not present

## 2014-04-25 DIAGNOSIS — I10 Essential (primary) hypertension: Secondary | ICD-10-CM

## 2014-04-25 DIAGNOSIS — M1610 Unilateral primary osteoarthritis, unspecified hip: Secondary | ICD-10-CM

## 2014-04-25 DIAGNOSIS — Z96642 Presence of left artificial hip joint: Secondary | ICD-10-CM

## 2014-04-25 DIAGNOSIS — K589 Irritable bowel syndrome without diarrhea: Secondary | ICD-10-CM | POA: Diagnosis not present

## 2014-04-25 DIAGNOSIS — M1612 Unilateral primary osteoarthritis, left hip: Secondary | ICD-10-CM

## 2014-04-25 DIAGNOSIS — D649 Anemia, unspecified: Secondary | ICD-10-CM

## 2014-04-28 DIAGNOSIS — Z471 Aftercare following joint replacement surgery: Secondary | ICD-10-CM | POA: Diagnosis not present

## 2014-04-28 DIAGNOSIS — J189 Pneumonia, unspecified organism: Secondary | ICD-10-CM

## 2014-04-28 DIAGNOSIS — I10 Essential (primary) hypertension: Secondary | ICD-10-CM

## 2014-04-28 DIAGNOSIS — Z96649 Presence of unspecified artificial hip joint: Secondary | ICD-10-CM | POA: Diagnosis not present

## 2014-04-28 DIAGNOSIS — M545 Low back pain, unspecified: Secondary | ICD-10-CM | POA: Diagnosis not present

## 2014-04-28 DIAGNOSIS — Z9181 History of falling: Secondary | ICD-10-CM | POA: Diagnosis not present

## 2014-04-28 DIAGNOSIS — K589 Irritable bowel syndrome without diarrhea: Secondary | ICD-10-CM

## 2014-05-02 DIAGNOSIS — Z96649 Presence of unspecified artificial hip joint: Secondary | ICD-10-CM | POA: Diagnosis not present

## 2014-05-02 DIAGNOSIS — J189 Pneumonia, unspecified organism: Secondary | ICD-10-CM | POA: Diagnosis not present

## 2014-05-02 DIAGNOSIS — M545 Low back pain, unspecified: Secondary | ICD-10-CM | POA: Diagnosis not present

## 2014-05-02 DIAGNOSIS — Z471 Aftercare following joint replacement surgery: Secondary | ICD-10-CM | POA: Diagnosis not present

## 2014-05-02 DIAGNOSIS — K589 Irritable bowel syndrome without diarrhea: Secondary | ICD-10-CM | POA: Diagnosis not present

## 2014-05-02 DIAGNOSIS — I10 Essential (primary) hypertension: Secondary | ICD-10-CM | POA: Diagnosis not present

## 2014-05-02 NOTE — Progress Notes (Signed)
Patient ID: Valerie Vazquez, female   DOB: 09/20/1955, 58 y.o.   MRN: [id number]     [facility]  Allergies  Allergen Reactions  . Abilify [Aripiprazole] Other (See Comments)    Motor skills   . Bactrim [Sulfamethoxazole-Tmp Ds] Diarrhea  . Ceftin [Cefuroxime Axetil] Diarrhea  . Indocin [Indomethacin] Other (See Comments)    Migraines   . Lisinopril Diarrhea and Other (See Comments)  . Morphine And Related Other (See Comments)    Chest tightness   . Penicillins Itching  . Percocet [Oxycodone-Acetaminophen] Itching  . Wellbutrin [Bupropion] Other (See Comments)    Slurred speech     Chief Complaint  Patient presents with  . Discharge Note    HPI:  She is being discharged to home with home health for pt/ot to improve upon her gait, strength and independence with her adl's.  She will need a 3:1 commode and a rollator. She will need prescriptions to be written and follow up appoint with her pcp.   Past Medical History  Diagnosis Date  . Arthritis   . Spinal stenosis   . Spondylosis   . History of neck surgery 10/28/2013    Past Surgical History  Procedure Laterality Date  . Tonsilectomy, adenoidectomy, bilateral myringotomy and tubes    . Abdominal hysterectomy    . Hernia repair    . Excision/release bursa hip    . Appendectomy    . Cholecystectomy    . Left hip replacement  04-06-2014    VITAL SIGNS BP 102/83  Pulse 83  Ht 5\' 8"  (1.727 m)  Wt 200 lb (90.719 kg)  BMI 30.42 kg/m2  SpO2 95%   Patient's Medications  New Prescriptions   No medications on file  Previous Medications   CYCLOBENZAPRINE (FLEXERIL) 5 MG TABLET    Take 10 mg by mouth 3 (three) times daily.    DICYCLOMINE (BENTYL) 10 MG CAPSULE    Take 10 mg by mouth 4 (four) times daily -  before meals and at bedtime.   DILTIAZEM (CARDIZEM CD) 120 MG 24 HR CAPSULE    Take 120 mg by mouth daily.   FERROUS SULFATE 325 (65 FE) MG TABLET    Take 325 mg by mouth 2 (two) times daily with a meal.   GABAPENTIN (NEURONTIN) 600 MG TABLET    Take 600 mg by mouth 3 (three) times daily.   OXYCODONE (ROXICODONE) 5 MG IMMEDIATE RELEASE TABLET    5 mg by mouth every 4 hours as needed for pain 1-5 take 10 mg by mouth every 4 hours as needed for pain 6-10. 5 mg by mouth every 2 hours as needed for breakthrough pain   PANTOPRAZOLE (PROTONIX) 40 MG TABLET    Take 40 mg by mouth daily.   SENNA-DOCUSATE (SENOKOT-S) 8.6-50 MG PER TABLET    Take 2 tablets by mouth 2 (two) times daily.   TRAZODONE (DESYREL) 100 MG TABLET    Take 50 mg by mouth at bedtime.  Modified Medications   No medications on file  Discontinued Medications   No medications on file    SIGNIFICANT DIAGNOSTIC EXAMS   12-31-13: left hip x-ray: severe djd 04-06-14: left hip x-ray: left hip prosthesis well seated no fracture or dislocation   LABS REVIEWED:   04-06-14: hgb 9.7 04-07-14: hgb 8.9 04-13-14: wbc 7.8; hgb 7.5; hct 23.6; mcv 91.1; plt 446 04-17-14: wbc 7.3; hgb 7.5; hct 24.4 ;mcv 93.8; plt 601; glucose 107; bun 8; creat 0.8; k+4.5; na++138; liver normal albumin  3.0 04-18-14: wound culture: no growth  04-25-14: wbc 6.1; hgb 7.7; hct 24.9; mcv 93.6; plt 548      Review of Systems  Constitutional: Negative for malaise/fatigue.  Respiratory: Negative for cough and shortness of breath.   Cardiovascular: Negative for chest pain, palpitations  Gastrointestinal: Negative for heartburn, abdominal pain and constipation.  Musculoskeletal: negative for pain  Skin: Negative.   Psychiatric/Behavioral: Negative for depression. The patient is not nervous/anxious.      Physical Exam  Constitutional: She is oriented to person, place, and time. She appears well-developed and well-nourished. No distress.  Overweight   Neck: Neck supple. No JVD present.  Cardiovascular: Normal rate, regular rhythm and intact distal pulses.   Respiratory: Effort normal and breath sounds normal. No respiratory distress. She has no wheezes.  GI: Soft. Bowel  sounds are normal. She exhibits no distension. There is no tenderness.  Musculoskeletal: She exhibits trace edema on the left lower extremity  Is able to move all extremities; is status post left hip replacement   Neurological: She is alert and oriented to person, place, and time.  Skin: Skin is warm and dry. She is not diaphoretic.  Incision line: staples without signs of infection present; Psychiatric: She has a normal mood and affect.       ASSESSMENT/ PLAN:  Will discharge her to home with home health for pt/ot to improve upon gait; strength and independence with adl's. She will need a rollator and 3:1 commode. A 30 supply of her medications have been written with oxycodone 5 mg #30 tabs. She has a follow up appointment with Dr. Vivianne Master on 05-01-14 at 2:30 pm   Time spent with patinet 40 minutes.

## 2014-05-03 DIAGNOSIS — M1610 Unilateral primary osteoarthritis, unspecified hip: Secondary | ICD-10-CM | POA: Diagnosis not present

## 2014-05-03 DIAGNOSIS — M169 Osteoarthritis of hip, unspecified: Secondary | ICD-10-CM | POA: Diagnosis not present

## 2014-05-03 DIAGNOSIS — Z862 Personal history of diseases of the blood and blood-forming organs and certain disorders involving the immune mechanism: Secondary | ICD-10-CM | POA: Diagnosis not present

## 2014-05-03 DIAGNOSIS — D649 Anemia, unspecified: Secondary | ICD-10-CM | POA: Diagnosis not present

## 2014-05-03 DIAGNOSIS — G8929 Other chronic pain: Secondary | ICD-10-CM | POA: Diagnosis not present

## 2014-05-04 DIAGNOSIS — M1990 Unspecified osteoarthritis, unspecified site: Secondary | ICD-10-CM | POA: Diagnosis not present

## 2014-05-04 DIAGNOSIS — I87099 Postthrombotic syndrome with other complications of unspecified lower extremity: Secondary | ICD-10-CM | POA: Diagnosis not present

## 2014-05-04 DIAGNOSIS — I872 Venous insufficiency (chronic) (peripheral): Secondary | ICD-10-CM | POA: Diagnosis not present

## 2014-05-04 DIAGNOSIS — M79609 Pain in unspecified limb: Secondary | ICD-10-CM | POA: Diagnosis not present

## 2014-05-05 DIAGNOSIS — Z471 Aftercare following joint replacement surgery: Secondary | ICD-10-CM | POA: Diagnosis not present

## 2014-05-05 DIAGNOSIS — M545 Low back pain, unspecified: Secondary | ICD-10-CM | POA: Diagnosis not present

## 2014-05-05 DIAGNOSIS — K589 Irritable bowel syndrome without diarrhea: Secondary | ICD-10-CM | POA: Diagnosis not present

## 2014-05-05 DIAGNOSIS — I10 Essential (primary) hypertension: Secondary | ICD-10-CM | POA: Diagnosis not present

## 2014-05-05 DIAGNOSIS — Z96649 Presence of unspecified artificial hip joint: Secondary | ICD-10-CM | POA: Diagnosis not present

## 2014-05-05 DIAGNOSIS — J189 Pneumonia, unspecified organism: Secondary | ICD-10-CM | POA: Diagnosis not present

## 2014-05-11 DIAGNOSIS — J189 Pneumonia, unspecified organism: Secondary | ICD-10-CM | POA: Diagnosis not present

## 2014-05-11 DIAGNOSIS — M545 Low back pain, unspecified: Secondary | ICD-10-CM | POA: Diagnosis not present

## 2014-05-11 DIAGNOSIS — I10 Essential (primary) hypertension: Secondary | ICD-10-CM | POA: Diagnosis not present

## 2014-05-11 DIAGNOSIS — Z471 Aftercare following joint replacement surgery: Secondary | ICD-10-CM | POA: Diagnosis not present

## 2014-05-11 DIAGNOSIS — K589 Irritable bowel syndrome without diarrhea: Secondary | ICD-10-CM | POA: Diagnosis not present

## 2014-05-11 DIAGNOSIS — Z96649 Presence of unspecified artificial hip joint: Secondary | ICD-10-CM | POA: Diagnosis not present

## 2014-05-13 DIAGNOSIS — Z471 Aftercare following joint replacement surgery: Secondary | ICD-10-CM | POA: Diagnosis not present

## 2014-05-13 DIAGNOSIS — J189 Pneumonia, unspecified organism: Secondary | ICD-10-CM | POA: Diagnosis not present

## 2014-05-13 DIAGNOSIS — K589 Irritable bowel syndrome without diarrhea: Secondary | ICD-10-CM | POA: Diagnosis not present

## 2014-05-13 DIAGNOSIS — I10 Essential (primary) hypertension: Secondary | ICD-10-CM | POA: Diagnosis not present

## 2014-05-13 DIAGNOSIS — M545 Low back pain, unspecified: Secondary | ICD-10-CM | POA: Diagnosis not present

## 2014-05-13 DIAGNOSIS — Z96649 Presence of unspecified artificial hip joint: Secondary | ICD-10-CM | POA: Diagnosis not present

## 2014-05-16 DIAGNOSIS — J189 Pneumonia, unspecified organism: Secondary | ICD-10-CM | POA: Diagnosis not present

## 2014-05-16 DIAGNOSIS — M545 Low back pain, unspecified: Secondary | ICD-10-CM | POA: Diagnosis not present

## 2014-05-16 DIAGNOSIS — K589 Irritable bowel syndrome without diarrhea: Secondary | ICD-10-CM | POA: Diagnosis not present

## 2014-05-16 DIAGNOSIS — Z471 Aftercare following joint replacement surgery: Secondary | ICD-10-CM | POA: Diagnosis not present

## 2014-05-16 DIAGNOSIS — I10 Essential (primary) hypertension: Secondary | ICD-10-CM | POA: Diagnosis not present

## 2014-05-16 DIAGNOSIS — Z96649 Presence of unspecified artificial hip joint: Secondary | ICD-10-CM | POA: Diagnosis not present

## 2014-05-18 DIAGNOSIS — M545 Low back pain, unspecified: Secondary | ICD-10-CM | POA: Diagnosis not present

## 2014-05-18 DIAGNOSIS — J189 Pneumonia, unspecified organism: Secondary | ICD-10-CM | POA: Diagnosis not present

## 2014-05-18 DIAGNOSIS — I10 Essential (primary) hypertension: Secondary | ICD-10-CM | POA: Diagnosis not present

## 2014-05-18 DIAGNOSIS — K589 Irritable bowel syndrome without diarrhea: Secondary | ICD-10-CM | POA: Diagnosis not present

## 2014-05-18 DIAGNOSIS — Z471 Aftercare following joint replacement surgery: Secondary | ICD-10-CM | POA: Diagnosis not present

## 2014-05-18 DIAGNOSIS — Z96649 Presence of unspecified artificial hip joint: Secondary | ICD-10-CM | POA: Diagnosis not present

## 2014-05-22 DIAGNOSIS — Z96649 Presence of unspecified artificial hip joint: Secondary | ICD-10-CM | POA: Diagnosis not present

## 2014-05-24 DIAGNOSIS — J189 Pneumonia, unspecified organism: Secondary | ICD-10-CM | POA: Diagnosis not present

## 2014-05-24 DIAGNOSIS — K589 Irritable bowel syndrome without diarrhea: Secondary | ICD-10-CM | POA: Diagnosis not present

## 2014-05-24 DIAGNOSIS — Z471 Aftercare following joint replacement surgery: Secondary | ICD-10-CM | POA: Diagnosis not present

## 2014-05-24 DIAGNOSIS — M545 Low back pain, unspecified: Secondary | ICD-10-CM | POA: Diagnosis not present

## 2014-05-24 DIAGNOSIS — I10 Essential (primary) hypertension: Secondary | ICD-10-CM | POA: Diagnosis not present

## 2014-05-24 DIAGNOSIS — Z96649 Presence of unspecified artificial hip joint: Secondary | ICD-10-CM | POA: Diagnosis not present

## 2014-05-26 DIAGNOSIS — Z471 Aftercare following joint replacement surgery: Secondary | ICD-10-CM | POA: Diagnosis not present

## 2014-05-26 DIAGNOSIS — I10 Essential (primary) hypertension: Secondary | ICD-10-CM | POA: Diagnosis not present

## 2014-05-26 DIAGNOSIS — Z96649 Presence of unspecified artificial hip joint: Secondary | ICD-10-CM | POA: Diagnosis not present

## 2014-05-26 DIAGNOSIS — J189 Pneumonia, unspecified organism: Secondary | ICD-10-CM | POA: Diagnosis not present

## 2014-05-26 DIAGNOSIS — M545 Low back pain, unspecified: Secondary | ICD-10-CM | POA: Diagnosis not present

## 2014-05-26 DIAGNOSIS — K589 Irritable bowel syndrome without diarrhea: Secondary | ICD-10-CM | POA: Diagnosis not present

## 2014-05-27 ENCOUNTER — Emergency Department: Payer: Self-pay | Admitting: Emergency Medicine

## 2014-05-27 DIAGNOSIS — R52 Pain, unspecified: Secondary | ICD-10-CM | POA: Diagnosis not present

## 2014-05-27 DIAGNOSIS — R110 Nausea: Secondary | ICD-10-CM | POA: Diagnosis not present

## 2014-05-27 DIAGNOSIS — Z9089 Acquired absence of other organs: Secondary | ICD-10-CM | POA: Diagnosis not present

## 2014-05-27 DIAGNOSIS — I10 Essential (primary) hypertension: Secondary | ICD-10-CM | POA: Diagnosis not present

## 2014-05-27 DIAGNOSIS — R630 Anorexia: Secondary | ICD-10-CM | POA: Diagnosis not present

## 2014-05-27 DIAGNOSIS — E119 Type 2 diabetes mellitus without complications: Secondary | ICD-10-CM | POA: Diagnosis not present

## 2014-05-27 DIAGNOSIS — R109 Unspecified abdominal pain: Secondary | ICD-10-CM | POA: Diagnosis not present

## 2014-05-27 DIAGNOSIS — R1032 Left lower quadrant pain: Secondary | ICD-10-CM | POA: Diagnosis not present

## 2014-05-27 DIAGNOSIS — Z90710 Acquired absence of both cervix and uterus: Secondary | ICD-10-CM | POA: Diagnosis not present

## 2014-05-27 LAB — URINALYSIS, COMPLETE
Bacteria: NONE SEEN
Bilirubin,UR: NEGATIVE
Blood: NEGATIVE
Glucose,UR: NEGATIVE mg/dL (ref 0–75)
Ketone: NEGATIVE
Leukocyte Esterase: NEGATIVE
Nitrite: NEGATIVE
Ph: 5 (ref 4.5–8.0)
Protein: NEGATIVE
RBC,UR: 1 /HPF (ref 0–5)
Specific Gravity: 1.021 (ref 1.003–1.030)
Squamous Epithelial: 1
WBC UR: 2 /HPF (ref 0–5)

## 2014-05-28 DIAGNOSIS — R630 Anorexia: Secondary | ICD-10-CM | POA: Diagnosis not present

## 2014-05-28 DIAGNOSIS — R110 Nausea: Secondary | ICD-10-CM | POA: Diagnosis not present

## 2014-05-28 DIAGNOSIS — R109 Unspecified abdominal pain: Secondary | ICD-10-CM | POA: Diagnosis not present

## 2014-05-28 LAB — COMPREHENSIVE METABOLIC PANEL
Albumin: 3.6 g/dL (ref 3.4–5.0)
Alkaline Phosphatase: 169 U/L — ABNORMAL HIGH
Anion Gap: 2 — ABNORMAL LOW (ref 7–16)
BUN: 10 mg/dL (ref 7–18)
Bilirubin,Total: 0.3 mg/dL (ref 0.2–1.0)
Calcium, Total: 9.2 mg/dL (ref 8.5–10.1)
Chloride: 106 mmol/L (ref 98–107)
Co2: 28 mmol/L (ref 21–32)
Creatinine: 0.58 mg/dL — ABNORMAL LOW (ref 0.60–1.30)
EGFR (African American): >60
EGFR (Non-African Amer.): >60
Glucose: 112 mg/dL — ABNORMAL HIGH (ref 65–99)
Osmolality: 272 (ref 275–301)
Potassium: 6.1 mmol/L — ABNORMAL HIGH (ref 3.5–5.1)
SGOT(AST): 44 U/L — ABNORMAL HIGH (ref 15–37)
SGPT (ALT): 33 U/L
Sodium: 136 mmol/L (ref 136–145)
Total Protein: 8 g/dL (ref 6.4–8.2)

## 2014-05-28 LAB — CBC WITH DIFFERENTIAL/PLATELET
Basophil #: 0.1 10*3/uL (ref 0.0–0.1)
Basophil %: 0.7 %
Eosinophil #: 0.3 10*3/uL (ref 0.0–0.7)
Eosinophil %: 4 %
HCT: 33.7 % — ABNORMAL LOW (ref 35.0–47.0)
HGB: 10.9 g/dL — ABNORMAL LOW (ref 12.0–16.0)
Lymphocyte #: 1.9 10*3/uL (ref 1.0–3.6)
Lymphocyte %: 27.1 %
MCH: 28.4 pg (ref 26.0–34.0)
MCHC: 32.3 g/dL (ref 32.0–36.0)
MCV: 88 fL (ref 80–100)
Monocyte #: 0.5 x10 3/mm (ref 0.2–0.9)
Monocyte %: 6.9 %
Neutrophil #: 4.4 10*3/uL (ref 1.4–6.5)
Neutrophil %: 61.3 %
Platelet: 408 10*3/uL (ref 150–440)
RBC: 3.83 10*6/uL (ref 3.80–5.20)
RDW: 14.3 % (ref 11.5–14.5)
WBC: 7.2 10*3/uL (ref 3.6–11.0)

## 2014-05-28 LAB — POTASSIUM: Potassium: 3.6 mmol/L (ref 3.5–5.1)

## 2014-05-28 LAB — TROPONIN I: Troponin-I: <0.02 ng/mL

## 2014-06-02 DIAGNOSIS — K580 Irritable bowel syndrome with diarrhea: Secondary | ICD-10-CM | POA: Diagnosis not present

## 2014-06-02 DIAGNOSIS — G8929 Other chronic pain: Secondary | ICD-10-CM | POA: Diagnosis not present

## 2014-06-09 ENCOUNTER — Ambulatory Visit: Payer: Self-pay | Admitting: Family Medicine

## 2014-06-09 DIAGNOSIS — M79601 Pain in right arm: Secondary | ICD-10-CM | POA: Diagnosis not present

## 2014-06-21 ENCOUNTER — Encounter (HOSPITAL_COMMUNITY): Payer: Self-pay | Admitting: Emergency Medicine

## 2014-06-21 ENCOUNTER — Emergency Department (HOSPITAL_COMMUNITY): Payer: Medicare Other

## 2014-06-21 ENCOUNTER — Emergency Department (HOSPITAL_COMMUNITY)
Admission: EM | Admit: 2014-06-21 | Discharge: 2014-06-22 | Disposition: A | Discharge status: Home / Self Care | Payer: Medicare Other | Attending: Emergency Medicine | Admitting: Emergency Medicine

## 2014-06-21 DIAGNOSIS — Z880 Allergy status to penicillin: Secondary | ICD-10-CM | POA: Diagnosis not present

## 2014-06-21 DIAGNOSIS — K2970 Gastritis, unspecified, without bleeding: Secondary | ICD-10-CM | POA: Diagnosis not present

## 2014-06-21 DIAGNOSIS — R079 Chest pain, unspecified: Secondary | ICD-10-CM | POA: Diagnosis not present

## 2014-06-21 DIAGNOSIS — Z9889 Other specified postprocedural states: Secondary | ICD-10-CM | POA: Insufficient documentation

## 2014-06-21 DIAGNOSIS — R109 Unspecified abdominal pain: Secondary | ICD-10-CM

## 2014-06-21 DIAGNOSIS — K589 Irritable bowel syndrome without diarrhea: Secondary | ICD-10-CM | POA: Diagnosis not present

## 2014-06-21 DIAGNOSIS — Z79899 Other long term (current) drug therapy: Secondary | ICD-10-CM | POA: Diagnosis not present

## 2014-06-21 DIAGNOSIS — R1032 Left lower quadrant pain: Secondary | ICD-10-CM | POA: Insufficient documentation

## 2014-06-21 DIAGNOSIS — R0602 Shortness of breath: Secondary | ICD-10-CM

## 2014-06-21 DIAGNOSIS — R112 Nausea with vomiting, unspecified: Secondary | ICD-10-CM | POA: Insufficient documentation

## 2014-06-21 DIAGNOSIS — Z8739 Personal history of other diseases of the musculoskeletal system and connective tissue: Secondary | ICD-10-CM | POA: Insufficient documentation

## 2014-06-21 DIAGNOSIS — I517 Cardiomegaly: Secondary | ICD-10-CM | POA: Diagnosis not present

## 2014-06-21 DIAGNOSIS — R1909 Other intra-abdominal and pelvic swelling, mass and lump: Secondary | ICD-10-CM | POA: Diagnosis not present

## 2014-06-21 DIAGNOSIS — Z87891 Personal history of nicotine dependence: Secondary | ICD-10-CM | POA: Insufficient documentation

## 2014-06-21 LAB — COMPREHENSIVE METABOLIC PANEL
ALT: 28 U/L (ref 0–35)
AST: 29 U/L (ref 0–37)
Albumin: 3.8 g/dL (ref 3.5–5.2)
Alkaline Phosphatase: 162 U/L — ABNORMAL HIGH (ref 39–117)
Anion gap: 12 (ref 5–15)
BUN: 8 mg/dL (ref 6–23)
CO2: 25 mEq/L (ref 19–32)
Calcium: 9.8 mg/dL (ref 8.4–10.5)
Chloride: 104 mEq/L (ref 96–112)
Creatinine, Ser: 0.64 mg/dL (ref 0.50–1.10)
GFR calc Af Amer: >90 mL/min (ref >90)
GFR calc non Af Amer: >90 mL/min (ref >90)
Glucose, Bld: 115 mg/dL — ABNORMAL HIGH (ref 70–99)
Potassium: 3.9 mEq/L (ref 3.7–5.3)
Sodium: 141 mEq/L (ref 137–147)
Total Bilirubin: 0.2 mg/dL — ABNORMAL LOW (ref 0.3–1.2)
Total Protein: 8 g/dL (ref 6.0–8.3)

## 2014-06-21 LAB — LIPASE, BLOOD: Lipase: 8 U/L — ABNORMAL LOW (ref 11–59)

## 2014-06-21 LAB — CBC WITH DIFFERENTIAL/PLATELET
Basophils Absolute: 0 10*3/uL (ref 0.0–0.1)
Basophils Relative: 0 % (ref 0–1)
Eosinophils Absolute: 0.1 10*3/uL (ref 0.0–0.7)
Eosinophils Relative: 1 % (ref 0–5)
HCT: 35.3 % — ABNORMAL LOW (ref 36.0–46.0)
Hemoglobin: 11.7 g/dL — ABNORMAL LOW (ref 12.0–15.0)
Lymphocytes Relative: 21 % (ref 12–46)
Lymphs Abs: 1.4 10*3/uL (ref 0.7–4.0)
MCH: 27.8 pg (ref 26.0–34.0)
MCHC: 33.1 g/dL (ref 30.0–36.0)
MCV: 83.8 fL (ref 78.0–100.0)
Monocytes Absolute: 0.3 10*3/uL (ref 0.1–1.0)
Monocytes Relative: 5 % (ref 3–12)
Neutro Abs: 4.7 10*3/uL (ref 1.7–7.7)
Neutrophils Relative %: 73 % (ref 43–77)
Platelets: 393 10*3/uL (ref 150–400)
RBC: 4.21 MIL/uL (ref 3.87–5.11)
RDW: 13.8 % (ref 11.5–15.5)
WBC: 6.5 10*3/uL (ref 4.0–10.5)

## 2014-06-21 LAB — I-STAT TROPONIN, ED: Troponin i, poc: 0 ng/mL (ref 0.00–0.08)

## 2014-06-21 MED ORDER — ONDANSETRON HCL 4 MG/2ML IJ SOLN
4.0000 mg | Freq: Once | INTRAMUSCULAR | Status: AC
  Administered 2014-06-21: 4 mg via INTRAVENOUS
  Filled 2014-06-21: qty 2

## 2014-06-21 MED ORDER — HYDROMORPHONE HCL 1 MG/ML IJ SOLN
1.0000 mg | Freq: Once | INTRAMUSCULAR | Status: AC
  Administered 2014-06-21: 1 mg via INTRAVENOUS
  Filled 2014-06-21: qty 1

## 2014-06-21 MED ORDER — SODIUM CHLORIDE 0.9 % IV BOLUS (SEPSIS)
1000.0000 mL | Freq: Once | INTRAVENOUS | Status: AC
  Administered 2014-06-21: 1000 mL via INTRAVENOUS

## 2014-06-21 MED ORDER — ONDANSETRON HCL 4 MG/2ML IJ SOLN
4.0000 mg | Freq: Once | INTRAMUSCULAR | Status: AC
  Administered 2014-06-21: 4 mg via INTRAVENOUS

## 2014-06-21 NOTE — ED Notes (Signed)
Pt reports she started having abdominal pain last night, nausea started this evening, no active vomiting. Pt reports the chills. No diarrhea. Pt is A&O X4.

## 2014-06-21 NOTE — ED Provider Notes (Signed)
CSN: [id number]     Arrival date & time 06/21/14  2155 History   First MD Initiated Contact with Patient 06/21/14 2226     Chief Complaint  Patient presents with  . Abdominal Pain  . Emesis  . Nausea     (Consider location/radiation/quality/duration/timing/severity/associated sxs/prior Treatment) HPI Comments: Patient is a 58 year old female with a past medical history of arthritis and spinal stenosis who presents with abdominal pain that started last night. The pain is located in the LLQ and does not radiate. The pain is described as aching and severe. The pain started gradually and progressively worsened since the onset. No alleviating/aggravating factors. The patient has tried nothing for symptoms without relief. Associated symptoms include nausea without vomiting. Patient denies fever, headache, diarrhea, SOB, dysuria, constipation, abnormal vaginal bleeding/discharge.   Patient also complains of chest pain that started today. The pain is located in her right chest and does not radiate. Deep inspiration makes the pain worse. No alleviating factors. Patient reports a remote history of PE. She also reports having a hip replacement 1.5 months ago. Patient denies cough, hemoptysis or SOB. No alleviating factors.      Past Medical History  Diagnosis Date  . Arthritis   . Spinal stenosis   . Spondylosis   . History of neck surgery 10/28/2013   Past Surgical History  Procedure Laterality Date  . Tonsilectomy, adenoidectomy, bilateral myringotomy and tubes    . Abdominal hysterectomy    . Hernia repair    . Excision/release bursa hip    . Appendectomy    . Cholecystectomy    . Left hip replacement  04-06-2014   No family history on file. History  Substance Use Topics  . Smoking status: Former Smoker    Quit date: 04/12/1979  . Smokeless tobacco: Not on file  . Alcohol Use: No   OB History   Grav Para Term Preterm Abortions TAB SAB Ect Mult Living                 Review of  Systems  Constitutional: Negative for fever, chills and fatigue.  HENT: Negative for trouble swallowing.   Eyes: Negative for visual disturbance.  Respiratory: Negative for shortness of breath.   Cardiovascular: Positive for chest pain. Negative for palpitations.  Gastrointestinal: Positive for nausea and abdominal pain. Negative for vomiting and diarrhea.  Genitourinary: Negative for dysuria and difficulty urinating.  Musculoskeletal: Negative for arthralgias and neck pain.  Skin: Negative for color change.  Neurological: Negative for dizziness and weakness.  Psychiatric/Behavioral: Negative for dysphoric mood.      Allergies  Abilify; Bactrim; Ceftin; Indocin; Lisinopril; Morphine and related; Penicillins; Percocet; and Wellbutrin  Home Medications   Prior to Admission medications   Medication Sig Start Date End Date Taking? Authorizing Provider  bisacodyl (DULCOLAX) 5 MG EC tablet Take 5 mg by mouth daily as needed for mild constipation or moderate constipation.   Yes Historical Provider, MD  cyclobenzaprine (FLEXERIL) 5 MG tablet Take 10 mg by mouth 3 (three) times daily.    Yes Historical Provider, MD  dicyclomine (BENTYL) 10 MG capsule Take 10 mg by mouth 4 (four) times daily -  before meals and at bedtime.   Yes Historical Provider, MD  diltiazem (CARDIZEM CD) 120 MG 24 hr capsule Take 120 mg by mouth daily.   Yes Historical Provider, MD  docusate sodium (COLACE) 100 MG capsule Take 100 mg by mouth daily.   Yes Historical Provider, MD  gabapentin (NEURONTIN) 600  MG tablet Take 600 mg by mouth 3 (three) times daily.   Yes Historical Provider, MD  Menthol-Camphor (ICY HOT ARTHRITIS PAIN RELIEF) 16-4 % LOTN Apply 1 application topically daily as needed (for pain).   Yes Historical Provider, MD  oxyCODONE (OXY IR/ROXICODONE) 5 MG immediate release tablet Take 2.5-5 mg by mouth every 6 (six) hours as needed for moderate pain or severe pain.   Yes Historical Provider, MD  pantoprazole  (PROTONIX) 40 MG tablet Take 40 mg by mouth daily.   Yes Historical Provider, MD  traZODone (DESYREL) 100 MG tablet Take 100 mg by mouth at bedtime.    Yes Historical Provider, MD   BP 155/79  Pulse 94  Temp(Src) 98.3 F (36.8 C) (Oral)  Resp 15  Ht 5\' 6"  (1.676 m)  Wt 200 lb (90.719 kg)  BMI 32.30 kg/m2  SpO2 99% Physical Exam  Nursing note and vitals reviewed. Constitutional: She is oriented to person, place, and time. She appears well-developed and well-nourished. No distress.  HENT:  Head: Normocephalic and atraumatic.  Eyes: Conjunctivae and EOM are normal.  Neck: Normal range of motion. Neck supple.  Cardiovascular: Normal rate and regular rhythm.  Exam reveals no gallop and no friction rub.   No murmur heard. Pulmonary/Chest: Effort normal and breath sounds normal. She has no wheezes. She has no rales. She exhibits no tenderness.  Abdominal: Soft. She exhibits no distension. There is tenderness. There is no rebound and no guarding.  LLQ tenderness to palpation. No other focal tenderness or peritoneal signs.   Musculoskeletal: Normal range of motion.  Neurological: She is alert and oriented to person, place, and time. Coordination normal.  Speech is goal-oriented. Moves limbs without ataxia.   Skin: Skin is warm and dry.  Psychiatric: She has a normal mood and affect. Her behavior is normal.    ED Course  Procedures (including critical care time) Labs Review Labs Reviewed  CBC WITH DIFFERENTIAL - Abnormal; Notable for the following:    Hemoglobin 11.7 (*)    HCT 35.3 (*)    All other components within normal limits  COMPREHENSIVE METABOLIC PANEL - Abnormal; Notable for the following:    Glucose, Bld 115 (*)    Alkaline Phosphatase 162 (*)    Total Bilirubin 0.2 (*)    All other components within normal limits  LIPASE, BLOOD - Abnormal; Notable for the following:    Lipase 8 (*)    All other components within normal limits  I-STAT TROPOININ, ED    Imaging  Review Dg Chest 2 View  06/21/2014   CLINICAL DATA:  Chest pain and shortness of breath.  EXAM: CHEST  2 VIEW  COMPARISON:  04/07/2014  FINDINGS: The heart size and mediastinal contours are within normal limits. Both lungs are clear. The visualized skeletal structures are unremarkable. Postoperative change in the cervical spine. Inferior vena caval filter.  IMPRESSION: No active cardiopulmonary disease.   Electronically Signed   By: Lucienne Capers M.D.   On: 06/21/2014 23:54   Ct Angio Chest Pe W/cm &/or Wo Cm  06/22/2014   CLINICAL DATA:  Chest pain, shortness of breath, assess for pulmonary embolism. Severe LEFT-sided abdominal pain, nausea and emesis; rule out diverticulitis.  EXAM: CT ANGIOGRAPHY CHEST  CT ABDOMEN AND PELVIS WITH CONTRAST  TECHNIQUE: Multidetector CT imaging of the chest was performed using the standard protocol during bolus administration of intravenous contrast. Multiplanar CT image reconstructions and MIPs were obtained to evaluate the vascular anatomy. Multidetector CT imaging of  the abdomen and pelvis was performed using the standard protocol during bolus administration of intravenous contrast.  CONTRAST:  151mL OMNIPAQUE IOHEXOL 350 MG/ML SOLN  COMPARISON:  Chest radiograph June 21, 2014 and CT of the abdomen and pelvis May 28, 2014 and CT of the chest Jan 01, 2014  FINDINGS: CTA CHEST FINDINGS  Adequate contrast opacification of the pulmonary artery's. Main pulmonary artery is not enlarged. No pulmonary arterial filling defects to the level of the subsegmental branches.  The heart is mildly enlarged, no right heart strain. No pericardial fluid collections. Thoracic aorta is normal course and caliber, 2 vessel aortic arch is a normal variant. No lymphadenopathy by CT size criteria. Tracheobronchial tree is patent, no pneumothorax. Mild heterogeneous lung attenuation without focal consolidation. Small RIGHT and trace LEFT pleural effusions effusions.  Included view of the  abdomen is unremarkable. Visualized soft tissues and included osseous structures appear normal.  CT ABDOMEN and PELVIS FINDINGS  SOLID ORGANS: The liver, spleen, pancreas and adrenal glands are unremarkable. Status post cholecystectomy.  GASTROINTESTINAL TRACT: The stomach, small and large bowel are normal in course and caliber without inflammatory changes. Enteric contrast has not yet reached the distal small bowel. Mild amount of retained large bowel stool. The appendix is not discretely identified, however there are no inflammatory changes in the right lower quadrant.  KIDNEYS/ URINARY TRACT: Kidneys are orthotopic, demonstrating symmetric enhancement. No nephrolithiasis, hydronephrosis or solid renal masses. Too small to characterize hypodensities in the kidneys bilaterally The unopacified ureters are normal in course and caliber. Delayed imaging through the kidneys demonstrates symmetric prompt contrast excretion within the proximal urinary collecting system. Urinary bladder is partially distended and unremarkable.  PERITONEUM/RETROPERITONEUM: No intraperitoneal free fluid nor free air. Aortoiliac vessels are normal in course and caliber, with minimal calcific atherosclerosis. Inferior vena cava filter below the level of the renal veins. No lymphadenopathy by CT size criteria. Status post hysterectomy. 2 cm benign-appearing LEFT adnexal cyst level below size surveillance recommendations.  SOFT TISSUE/OSSEOUS STRUCTURES: Nonsuspicious. Status post LEFT hip total arthroplasty which results in streak artifact limiting assessment of the pelvis. Severe lower lumbar facet arthropathy. Moderate sacroiliac osteoarthrosis. Mildly expansile fusiform mass in central sacral spinal canal favors a meningeal cyst without destructive bony changes.  Review of the MIP images confirms the above findings.  IMPRESSION: CTA CHEST: No acute pulmonary embolism.  Mild cardiomegaly, trace to small pleural effusions. Mild heterogeneous  lung attenuation may reflect pulmonary edema or small airway disease.  CT ABDOMEN AND PELVIS:  No acute intra-abdominal or pelvic process.  Limited assessment LEFT lower quadrant due to streak artifact from LEFT hip arthroplasty. 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   Electronically Signed   By: Elon Alas   On: 06/22/2014 03:41   Ct Abdomen Pelvis W Contrast  06/22/2014   CLINICAL DATA:  Chest pain, shortness of breath, assess for pulmonary embolism. Severe LEFT-sided abdominal pain, nausea and emesis; rule out diverticulitis.  EXAM: CT ANGIOGRAPHY CHEST  CT ABDOMEN AND PELVIS WITH CONTRAST  TECHNIQUE: Multidetector CT imaging of the chest was performed using the standard protocol during bolus administration of intravenous contrast. Multiplanar CT image reconstructions and MIPs were obtained to evaluate the vascular anatomy. Multidetector CT imaging of the abdomen and pelvis was performed using the standard protocol during bolus administration of intravenous contrast.  CONTRAST:  128mL OMNIPAQUE IOHEXOL 350 MG/ML SOLN  COMPARISON:  Chest radiograph  June 21, 2014 and CT of the abdomen and pelvis May 28, 2014 and CT of the chest Jan 01, 2014  FINDINGS: CTA CHEST FINDINGS  Adequate contrast opacification of the pulmonary artery's. Main pulmonary artery is not enlarged. No pulmonary arterial filling defects to the level of the subsegmental branches.  The heart is mildly enlarged, no right heart strain. No pericardial fluid collections. Thoracic aorta is normal course and caliber, 2 vessel aortic arch is a normal variant. No lymphadenopathy by CT size criteria. Tracheobronchial tree is patent, no pneumothorax. Mild heterogeneous lung attenuation without focal consolidation. Small RIGHT and trace LEFT pleural effusions effusions.  Included view of the abdomen is unremarkable. Visualized soft tissues  and included osseous structures appear normal.  CT ABDOMEN and PELVIS FINDINGS  SOLID ORGANS: The liver, spleen, pancreas and adrenal glands are unremarkable. Status post cholecystectomy.  GASTROINTESTINAL TRACT: The stomach, small and large bowel are normal in course and caliber without inflammatory changes. Enteric contrast has not yet reached the distal small bowel. Mild amount of retained large bowel stool. The appendix is not discretely identified, however there are no inflammatory changes in the right lower quadrant.  KIDNEYS/ URINARY TRACT: Kidneys are orthotopic, demonstrating symmetric enhancement. No nephrolithiasis, hydronephrosis or solid renal masses. Too small to characterize hypodensities in the kidneys bilaterally The unopacified ureters are normal in course and caliber. Delayed imaging through the kidneys demonstrates symmetric prompt contrast excretion within the proximal urinary collecting system. Urinary bladder is partially distended and unremarkable.  PERITONEUM/RETROPERITONEUM: No intraperitoneal free fluid nor free air. Aortoiliac vessels are normal in course and caliber, with minimal calcific atherosclerosis. Inferior vena cava filter below the level of the renal veins. No lymphadenopathy by CT size criteria. Status post hysterectomy. 2 cm benign-appearing LEFT adnexal cyst level below size surveillance recommendations.  SOFT TISSUE/OSSEOUS STRUCTURES: Nonsuspicious. Status post LEFT hip total arthroplasty which results in streak artifact limiting assessment of the pelvis. Severe lower lumbar facet arthropathy. Moderate sacroiliac osteoarthrosis. Mildly expansile fusiform mass in central sacral spinal canal favors a meningeal cyst without destructive bony changes.  Review of the MIP images confirms the above findings.  IMPRESSION: CTA CHEST: No acute pulmonary embolism.  Mild cardiomegaly, trace to small pleural effusions. Mild heterogeneous lung attenuation may reflect pulmonary edema or  small airway disease.  CT ABDOMEN AND PELVIS:  No acute intra-abdominal or pelvic process.  Limited assessment LEFT lower quadrant due to streak artifact from LEFT hip arthroplasty. 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   Electronically Signed   By: Elon Alas   On: 06/22/2014 03:41     EKG Interpretation   Date/Time:  Wednesday June 21 2014 22:04:45 EDT Ventricular Rate:  96 PR Interval:  158 QRS Duration: 83 QT Interval:  358 QTC Calculation: 452 R Axis:   35 Text Interpretation:  Sinus rhythm Probable left atrial enlargement new  Nonspecific T abnormalities, diffuse leads Confirmed by Maryan Rued  MD,  Loree Fee ([id number]) on 06/21/2014 10:31:56 PM      MDM   Final diagnoses:  SOB (shortness of breath)  Chest pain  Abdominal pain, unspecified abdominal location  Irritable bowel    11:31 PM Patient's labs unremarkable for acute changes. Patient's EKG shows new onset T wave abnormalities. Chest xray pending. Patient's oxygen saturation in low, and patient placed on 2 L nasal cannula.   5:35 AM Patient's chest and abdominal CT unremarkable for acute changes.  Patient seen by Dr. Jeneen Rinks who thinks the patient is experiencing irritable bowel. Patient will be discharged with instructions to follow up with her PCP as needed. Vitals stable and patient afebrile at this time. Dr. Jeneen Rinks will prescribe Bentyl and hyoscyamine.    Alvina Chou, [place] 06/22/14 [phone]

## 2014-06-22 ENCOUNTER — Emergency Department (HOSPITAL_COMMUNITY): Payer: Medicare Other

## 2014-06-22 DIAGNOSIS — R1909 Other intra-abdominal and pelvic swelling, mass and lump: Secondary | ICD-10-CM | POA: Diagnosis not present

## 2014-06-22 DIAGNOSIS — I517 Cardiomegaly: Secondary | ICD-10-CM | POA: Diagnosis not present

## 2014-06-22 DIAGNOSIS — R079 Chest pain, unspecified: Secondary | ICD-10-CM | POA: Diagnosis not present

## 2014-06-22 DIAGNOSIS — R112 Nausea with vomiting, unspecified: Secondary | ICD-10-CM | POA: Diagnosis not present

## 2014-06-22 DIAGNOSIS — R0602 Shortness of breath: Secondary | ICD-10-CM | POA: Diagnosis not present

## 2014-06-22 LAB — URINALYSIS, ROUTINE W REFLEX MICROSCOPIC
Bilirubin Urine: NEGATIVE
Glucose, UA: NEGATIVE mg/dL
Hgb urine dipstick: NEGATIVE
Ketones, ur: NEGATIVE mg/dL
Leukocytes, UA: NEGATIVE
Nitrite: NEGATIVE
Protein, ur: NEGATIVE mg/dL
Specific Gravity, Urine: 1.015 (ref 1.005–1.030)
Urobilinogen, UA: 0.2 mg/dL (ref 0.0–1.0)
pH: 6 (ref 5.0–8.0)

## 2014-06-22 MED ORDER — HYDROMORPHONE HCL 1 MG/ML IJ SOLN: 0.5000 mg | Freq: Once | INTRAMUSCULAR | Status: DC

## 2014-06-22 MED ORDER — IOHEXOL 350 MG/ML SOLN
100.0000 mL | Freq: Once | INTRAVENOUS | Status: AC | PRN
  Administered 2014-06-22: 100 mL via INTRAVENOUS

## 2014-06-22 MED ORDER — HYDROMORPHONE HCL 1 MG/ML IJ SOLN
1.0000 mg | Freq: Once | INTRAMUSCULAR | Status: AC
  Administered 2014-06-22: 1 mg via INTRAVENOUS
  Filled 2014-06-22: qty 1

## 2014-06-22 MED ORDER — IOHEXOL 300 MG/ML  SOLN
25.0000 mL | Freq: Once | INTRAMUSCULAR | Status: AC | PRN
  Administered 2014-06-22: 25 mL via ORAL

## 2014-06-22 MED ORDER — HYDROMORPHONE HCL 1 MG/ML IJ SOLN
0.5000 mg | Freq: Once | INTRAMUSCULAR | Status: AC
  Administered 2014-06-22: 0.5 mg via INTRAVENOUS
  Filled 2014-06-22: qty 1

## 2014-06-22 MED ORDER — DICYCLOMINE HCL 20 MG PO TABS: 20.0000 mg | ORAL_TABLET | Freq: Two times a day (BID) | ORAL | Status: DC

## 2014-06-22 MED ORDER — HYOSCYAMINE SULFATE 0.125 MG SL SUBL: 0.1250 mg | SUBLINGUAL_TABLET | SUBLINGUAL | Status: DC | PRN

## 2014-06-22 MED ORDER — ONDANSETRON HCL 4 MG/2ML IJ SOLN
4.0000 mg | Freq: Once | INTRAMUSCULAR | Status: AC
  Administered 2014-06-22: 4 mg via INTRAVENOUS
  Filled 2014-06-22: qty 2

## 2014-06-22 NOTE — Discharge Instructions (Signed)
Abdominal Pain Many things can cause abdominal pain. Usually, abdominal pain is not caused by a disease and will improve without treatment. It can often be observed and treated at home. Your health care provider will do a physical exam and possibly order blood tests and X-rays to help determine the seriousness of your pain. However, in many cases, more time must pass before a clear cause of the pain can be found. Before that point, your health care provider may not know if you need more testing or further treatment. HOME CARE INSTRUCTIONS  Monitor your abdominal pain for any changes. The following actions may help to alleviate any discomfort you are experiencing:  Only take over-the-counter or prescription medicines as directed by your health care provider.  Do not take laxatives unless directed to do so by your health care provider.  Try a clear liquid diet (broth, tea, or water) as directed by your health care provider. Slowly move to a bland diet as tolerated. SEEK MEDICAL CARE IF:  You have unexplained abdominal pain.  You have abdominal pain associated with nausea or diarrhea.  You have pain when you urinate or have a bowel movement.  You experience abdominal pain that wakes you in the night.  You have abdominal pain that is worsened or improved by eating food.  You have abdominal pain that is worsened with eating fatty foods.  You have a fever. SEEK IMMEDIATE MEDICAL CARE IF:   Your pain does not go away within 2 hours.  You keep throwing up (vomiting).  Your pain is felt only in portions of the abdomen, such as the right side or the left lower portion of the abdomen.  You pass bloody or black tarry stools. MAKE SURE YOU:  Understand these instructions.   Will watch your condition.   Will get help right away if you are not doing well or get worse.  Document Released: 05/28/2005 Document Revised: 08/23/2013 Document Reviewed: 04/27/2013 Exit[employer] Patient Information  2015 [employer]. This information is not intended to replace advice given to you by your health care provider. Make sure you discuss any questions you have with your health care provider.   Irritable Bowel Syndrome Irritable bowel syndrome (IBS) is caused by a disturbance of normal bowel function and is a common digestive disorder. You may also hear this condition called spastic colon, mucous colitis, and irritable colon. There is no cure for IBS. However, symptoms often gradually improve or disappear with a good diet, stress management, and medicine. This condition usually appears in late adolescence or early adulthood. Women develop it twice as often as men. CAUSES  After food has been digested and absorbed in the small intestine, waste material is moved into the large intestine, or colon. In the colon, water and salts are absorbed from the undigested products coming from the small intestine. The remaining residue, or fecal material, is held for elimination. Under normal circumstances, gentle, rhythmic contractions of the bowel walls push the fecal material along the colon toward the rectum. In IBS, however, these contractions are irregular and poorly coordinated. The fecal material is either retained too long, resulting in constipation, or expelled too soon, producing diarrhea. SIGNS AND SYMPTOMS  The most common symptom of IBS is abdominal pain. It is often in the lower left side of the abdomen, but it may occur anywhere in the abdomen. The pain comes from spasms of the bowel muscles happening too much and from the buildup of gas and fecal material in the colon.   This pain:  Can range from sharp abdominal cramps to a dull, continuous ache.  Often worsens soon after eating.  Is often relieved by having a bowel movement or passing gas. Abdominal pain is usually accompanied by constipation, but it may also produce diarrhea. The diarrhea often occurs right after a meal or upon waking up in the  morning. The stools are often soft, watery, and flecked with mucus. Other symptoms of IBS include:  Bloating.  Loss of appetite.  Heartburn.  Backache.  Dull pain in the arms or shoulders.  Nausea.  Burping.  Vomiting.  Gas. IBS may also cause symptoms that are unrelated to the digestive system, such as:  Fatigue.  Headaches.  Anxiety.  Shortness of breath.  Trouble concentrating.  Dizziness. These symptoms tend to come and go. DIAGNOSIS  The symptoms of IBS may seem like symptoms of other, more serious digestive disorders. Your health care provider may want to perform tests to exclude these disorders.  TREATMENT Many medicines are available to help correct bowel function or relieve bowel spasms and abdominal pain. Among the medicines available are:  Laxatives for severe constipation and to help restore normal bowel habits.  Specific antidiarrheal medicines to treat severe or lasting diarrhea.  Antispasmodic agents to relieve intestinal cramps. Your health care provider may also decide to treat you with a mild tranquilizer or sedative during unusually stressful periods in your life. Your health care provider may also prescribe antidepressant medicine. The use of this medicine has been shown to reduce pain and other symptoms of IBS. Remember that if any medicine is prescribed for you, you should take it exactly as directed. Make sure your health care provider knows how well it worked for you. HOME CARE INSTRUCTIONS   Take all medicines as directed by your health care provider.  Avoid foods that are high in fat or oils, such as heavy cream, butter, frankfurters, sausage, and other fatty meats.  Avoid foods that make you go to the bathroom, such as fruit, fruit juice, and dairy products.  Cut out carbonated drinks, chewing gum, and "gassy" foods such as beans and cabbage. This may help relieve bloating and burping.  Eat foods with bran, and drink plenty of liquids  with the bran foods. This helps relieve constipation.  Keep track of what foods seem to bring on your symptoms.  Avoid emotionally charged situations or circumstances that produce anxiety.  Start or continue exercising.  Get plenty of rest and sleep. Document Released: 08/18/2005 Document Revised: 08/23/2013 Document Reviewed: 04/07/2008 Exit[employer] Patient Information 2015 [employer]. This information is not intended to replace advice given to you by your health care provider. Make sure you discuss any questions you have with your health care provider.  

## 2014-06-22 NOTE — ED Provider Notes (Signed)
Patient seen examined. Discussed with PA. Benign exam now. CT without acute findings. Pain controlled now. Reports intermittent similar episodes since a hospitalization in December. She attributes all of her symptoms and treatment with lisinopril which she states it for cough, and irritable bowel syndrome.  She previously had relief with Levsin and Bentyl. She is appropriate for discharge with the above normal studies.  Tanna Furry, MD 06/22/14 [phone]

## 2014-06-22 NOTE — ED Notes (Signed)
PT asked for bedpan. Husband stated that he will help with his wife using the bedpan. Pt and husband informed to use callbell if they need assistance.

## 2014-06-22 NOTE — ED Provider Notes (Signed)
I personally performed the services described in this documentation, which was scribed in my presence. The recorded information has been reviewed and is accurate.   Tanna Furry, MD 06/22/14 [phone]

## 2014-06-22 NOTE — ED Notes (Signed)
Pt requesting pain medication.  

## 2014-06-22 NOTE — ED Notes (Signed)
CT called and informed pt has finished her contrast.  

## 2014-06-22 NOTE — ED Notes (Signed)
Called [address], [place], because the patient requested more nausea medication. Reviewed that radiology results have not returned yet.  She will enter orders for nausea medication.

## 2014-06-30 DIAGNOSIS — Z96642 Presence of left artificial hip joint: Secondary | ICD-10-CM | POA: Diagnosis not present

## 2014-07-10 DIAGNOSIS — M25511 Pain in right shoulder: Secondary | ICD-10-CM | POA: Diagnosis not present

## 2014-07-10 DIAGNOSIS — M1712 Unilateral primary osteoarthritis, left knee: Secondary | ICD-10-CM | POA: Diagnosis not present

## 2014-07-10 DIAGNOSIS — M25562 Pain in left knee: Secondary | ICD-10-CM | POA: Diagnosis not present

## 2014-07-19 ENCOUNTER — Encounter (HOSPITAL_COMMUNITY): Payer: Self-pay | Admitting: Emergency Medicine

## 2014-07-19 ENCOUNTER — Emergency Department (HOSPITAL_COMMUNITY): Payer: Medicare Other

## 2014-07-19 ENCOUNTER — Emergency Department (HOSPITAL_COMMUNITY)
Admission: EM | Admit: 2014-07-19 | Discharge: 2014-07-19 | Disposition: A | Discharge status: Home / Self Care | Payer: Medicare Other | Attending: Emergency Medicine | Admitting: Emergency Medicine

## 2014-07-19 DIAGNOSIS — K5909 Other constipation: Secondary | ICD-10-CM | POA: Diagnosis not present

## 2014-07-19 DIAGNOSIS — K5900 Constipation, unspecified: Secondary | ICD-10-CM | POA: Diagnosis present

## 2014-07-19 DIAGNOSIS — M1990 Unspecified osteoarthritis, unspecified site: Secondary | ICD-10-CM | POA: Insufficient documentation

## 2014-07-19 DIAGNOSIS — R109 Unspecified abdominal pain: Secondary | ICD-10-CM | POA: Diagnosis not present

## 2014-07-19 DIAGNOSIS — R110 Nausea: Secondary | ICD-10-CM | POA: Insufficient documentation

## 2014-07-19 DIAGNOSIS — Z79899 Other long term (current) drug therapy: Secondary | ICD-10-CM | POA: Diagnosis not present

## 2014-07-19 DIAGNOSIS — I10 Essential (primary) hypertension: Secondary | ICD-10-CM | POA: Diagnosis not present

## 2014-07-19 DIAGNOSIS — T402X5A Adverse effect of other opioids, initial encounter: Secondary | ICD-10-CM

## 2014-07-19 DIAGNOSIS — Z880 Allergy status to penicillin: Secondary | ICD-10-CM | POA: Diagnosis not present

## 2014-07-19 DIAGNOSIS — Z9889 Other specified postprocedural states: Secondary | ICD-10-CM | POA: Diagnosis not present

## 2014-07-19 DIAGNOSIS — Z86718 Personal history of other venous thrombosis and embolism: Secondary | ICD-10-CM | POA: Diagnosis not present

## 2014-07-19 DIAGNOSIS — R1084 Generalized abdominal pain: Secondary | ICD-10-CM | POA: Diagnosis not present

## 2014-07-19 DIAGNOSIS — Z87891 Personal history of nicotine dependence: Secondary | ICD-10-CM | POA: Insufficient documentation

## 2014-07-19 DIAGNOSIS — Z8739 Personal history of other diseases of the musculoskeletal system and connective tissue: Secondary | ICD-10-CM | POA: Insufficient documentation

## 2014-07-19 DIAGNOSIS — K5903 Drug induced constipation: Secondary | ICD-10-CM

## 2014-07-19 HISTORY — DX: Essential (primary) hypertension: I10

## 2014-07-19 HISTORY — DX: Acute embolism and thrombosis of unspecified deep veins of unspecified lower extremity: I82.409

## 2014-07-19 MED ORDER — POLYETHYLENE GLYCOL 3350 17 G PO PACK: 17.0000 g | PACK | Freq: Two times a day (BID) | ORAL | Status: DC

## 2014-07-19 MED ORDER — SORBITOL 70 % SOLN
960.0000 mL | TOPICAL_OIL | Freq: Once | ORAL | Status: AC
  Administered 2014-07-19: 960 mL via RECTAL
  Filled 2014-07-19: qty 240

## 2014-07-19 NOTE — ED Provider Notes (Signed)
CSN: [id number]     Arrival date & time 07/19/14  1202 History   First MD Initiated Contact with Patient 07/19/14 1325     Chief Complaint  Patient presents with  . 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 Denies fever, chills, vomiting or diarrhea. States she has not had a normal bowel movement in 10 days, "only small hard stool."  She has tried dulcolax, and 4 or 5 enemas at home, drinking lots of water but no relief. States last night she tried 2 enemas but the fluid came right back out w/o any stool.  No blood or mucous in stool. Abdominal surgical hx significant for abdominal hysterectomy, appendectomy and cholecystectomy.    Past Medical History  Diagnosis Date  . Arthritis   . Spinal stenosis   . Spondylosis   . History of neck surgery 10/28/2013  . Hypertension   . DVT (deep venous thrombosis)    Past Surgical History  Procedure Laterality Date  . Tonsilectomy, adenoidectomy, bilateral myringotomy and tubes    . Abdominal hysterectomy    . Hernia repair    . Excision/release bursa hip    . Appendectomy    . Cholecystectomy    . Left hip replacement  04-06-2014   History reviewed. No pertinent family history. History  Substance Use Topics  . Smoking status: Former Smoker    Quit date: 04/12/1979  . Smokeless tobacco: Not on file  . Alcohol Use: No   OB History    No data available     Review of Systems  Constitutional: Negative for fever and chills.  Gastrointestinal: Positive for nausea, abdominal pain and constipation. Negative for vomiting, diarrhea, blood in stool and rectal pain.  Genitourinary: Negative for dysuria,  urgency, frequency and flank pain.  All other systems reviewed and are negative.     Allergies  Abilify; Bactrim; Ceftin; Indocin; Lisinopril; Morphine and related; Penicillins; Percocet; and Wellbutrin  Home Medications   Prior to Admission medications   Medication Sig Start Date End Date Taking? Authorizing Provider  bisacodyl (DULCOLAX) 5 MG EC tablet Take 5 mg by mouth daily as needed for mild constipation or moderate constipation.   Yes Historical Provider, MD  cyclobenzaprine (FLEXERIL) 5 MG tablet Take 10 mg by mouth 3 (three) times daily.    Yes Historical Provider, MD  dicyclomine (BENTYL) 10 MG capsule Take 10 mg by mouth 4 (four) times daily -  before meals and at bedtime.   Yes Historical Provider, MD  dicyclomine (BENTYL) 20 MG tablet Take 1 tablet (20 mg total) by mouth 2 (two) times daily. 06/22/14  Yes Tanna Furry, MD  diltiazem (CARDIZEM CD) 120 MG 24 hr capsule Take 120 mg by mouth daily.   Yes Historical Provider, MD  docusate sodium (COLACE) 100 MG capsule Take 100 mg by mouth daily.   Yes Historical Provider, MD  gabapentin (NEURONTIN) 600 MG tablet Take 600 mg by mouth 3 (three) times daily.   Yes Historical Provider, MD  hyoscyamine (LEVSIN/SL) 0.125 MG SL tablet Place 1 tablet (0.125 mg total) under the tongue every 4 (four) hours as needed. 06/22/14  Yes Tanna Furry, MD  Menthol-Camphor (ICY HOT ARTHRITIS PAIN RELIEF) 16-4 % LOTN Apply 1 application topically  daily as needed (for pain).   Yes Historical Provider, MD  oxyCODONE (OXY IR/ROXICODONE) 5 MG immediate release tablet Take 2.5-5 mg by mouth every 6 (six) hours as needed for moderate pain or severe pain.   Yes Historical Provider, MD  pantoprazole (PROTONIX) 40 MG tablet Take 40 mg by mouth daily.   Yes Historical Provider, MD  traZODone (DESYREL) 100 MG tablet Take 100 mg by mouth at bedtime.    Yes Historical Provider, MD  polyethylene glycol (MIRALAX / GLYCOLAX) packet Take 17 g by mouth 2 (two) times daily.  Twice daily until normal bowel movements, then once daily as needed for constipation. 07/19/14   Noland Fordyce, PA-C   BP 142/91 mmHg  Pulse 89  Temp(Src) 98.2 F (36.8 C) (Oral)  Resp 17  Ht 5\' 6"  (1.676 m)  Wt 195 lb (88.451 kg)  BMI 31.49 kg/m2  SpO2 100% Physical Exam  Constitutional: She appears well-developed and well-nourished.  Pt lying in exam bed, appears mildly uncomfortable.  HENT:  Head: Normocephalic and atraumatic.  Eyes: Conjunctivae are normal. No scleral icterus.  Neck: Normal range of motion.  Cardiovascular: Normal rate, regular rhythm and normal heart sounds.   Pulmonary/Chest: Effort normal and breath sounds normal. No respiratory distress. She has no wheezes. She has no rales. She exhibits no tenderness.  Abdominal: Soft. She exhibits no distension and no mass. Bowel sounds are decreased. There is tenderness. There is no rebound and no guarding.  Obese abdomen, soft, non-distended. Diffuse tenderness. Hypoactive bowel sounds.  Musculoskeletal: Normal range of motion.  Neurological: She is alert.  Skin: Skin is warm and dry. She is not diaphoretic.  Nursing note and vitals reviewed.   ED Course  Procedures (including critical care time) Labs Review Labs Reviewed - No data to display  Imaging Review Dg Abd Acute W/chest  07/19/2014   CLINICAL DATA:  Left abdominal pain, left flank pain  EXAM: ACUTE ABDOMEN SERIES (ABDOMEN 2 VIEW & CHEST 1 VIEW)  COMPARISON:  None.  FINDINGS: There is no evidence of dilated bowel loops or free intraperitoneal air. Large amount of stool throughout the colon. No radiopaque calculi or other significant radiographic abnormality is seen. IVC filter is noted with the tip projecting over the L1 vertebral body. Heart size and mediastinal contours are within normal limits. Both lungs are clear.  Left hip arthroplasty.  Mild osteoarthritis of the right hip.  IMPRESSION: Large amount of stool throughout the colon.  No acute  cardiopulmonary disease.   Electronically Signed   By: Kathreen Devoid   On: 07/19/2014 12:51     EKG Interpretation None      MDM   Final diagnoses:  Generalized abdominal pain  Constipation due to opioid therapy    Pt is a 58yo female presenting to ED with c/o abdominal pain, nausea and constipation. On exam, abdomen is soft with decreased bowel sounds, diffuse tenderness w/o rebound or guarding.   KUB: significant for large amount of stool throughout colon. No evidence of dilated bowel loops or free intraperitoneal air.  No acute cardiopulmonary disease.  2:54 PM upon reevaluation of pt after 1st SMOG enema, pt reports having 1 "good" BM.  RN will perform another enema.     3:10PM pt had 2nd large BM.  Will give fluid challenge to ensure pt able to keep down fluids. Will discharge home to f/u with PCP as needed.   Pt reports a total of 3 large BMs while in ED. States her mid and  lower abdomen do feel better but her upper abdomen still feels sore and achy. Advised pt it may take a few more days for the rest of the stool to work its way through her system. Encouraged her to continue to stay well hydrated. May take miralax BID daily until normal stools. Home care instructions provided. Advised to f/u with PCP as needed. Return precautions provided. Pt verbalized understanding and agreement with tx plan.  Discussed pt with Dr. Jeneen Rinks who agrees with assessment and plan.   Noland Fordyce, PA-C 07/19/14 [place], MD 07/26/14 [phone]

## 2014-07-19 NOTE — ED Notes (Signed)
Pt c/o abd pain from constipation x 10 days; pt sts hx of same; pt sts tried enemas and suppositories

## 2014-08-01 ENCOUNTER — Emergency Department (HOSPITAL_COMMUNITY)
Admission: EM | Admit: 2014-08-01 | Discharge: 2014-08-01 | Disposition: A | Discharge status: Home / Self Care | Payer: Medicare Other | Attending: Emergency Medicine | Admitting: Emergency Medicine

## 2014-08-01 ENCOUNTER — Encounter (HOSPITAL_COMMUNITY): Payer: Self-pay | Admitting: *Deleted

## 2014-08-01 ENCOUNTER — Encounter (HOSPITAL_COMMUNITY): Payer: Self-pay

## 2014-08-01 ENCOUNTER — Emergency Department (HOSPITAL_COMMUNITY): Payer: Medicare Other

## 2014-08-01 DIAGNOSIS — Z87891 Personal history of nicotine dependence: Secondary | ICD-10-CM | POA: Insufficient documentation

## 2014-08-01 DIAGNOSIS — M1990 Unspecified osteoarthritis, unspecified site: Secondary | ICD-10-CM

## 2014-08-01 DIAGNOSIS — Z792 Long term (current) use of antibiotics: Secondary | ICD-10-CM

## 2014-08-01 DIAGNOSIS — R319 Hematuria, unspecified: Secondary | ICD-10-CM | POA: Diagnosis not present

## 2014-08-01 DIAGNOSIS — Z86718 Personal history of other venous thrombosis and embolism: Secondary | ICD-10-CM | POA: Insufficient documentation

## 2014-08-01 DIAGNOSIS — B9689 Other specified bacterial agents as the cause of diseases classified elsewhere: Secondary | ICD-10-CM | POA: Diagnosis not present

## 2014-08-01 DIAGNOSIS — R091 Pleurisy: Secondary | ICD-10-CM | POA: Diagnosis not present

## 2014-08-01 DIAGNOSIS — Z79899 Other long term (current) drug therapy: Secondary | ICD-10-CM | POA: Insufficient documentation

## 2014-08-01 DIAGNOSIS — M79622 Pain in left upper arm: Secondary | ICD-10-CM | POA: Diagnosis not present

## 2014-08-01 DIAGNOSIS — Z880 Allergy status to penicillin: Secondary | ICD-10-CM | POA: Insufficient documentation

## 2014-08-01 DIAGNOSIS — I10 Essential (primary) hypertension: Secondary | ICD-10-CM

## 2014-08-01 DIAGNOSIS — N390 Urinary tract infection, site not specified: Secondary | ICD-10-CM

## 2014-08-01 DIAGNOSIS — M79602 Pain in left arm: Secondary | ICD-10-CM | POA: Diagnosis not present

## 2014-08-01 DIAGNOSIS — R079 Chest pain, unspecified: Secondary | ICD-10-CM

## 2014-08-01 DIAGNOSIS — R300 Dysuria: Secondary | ICD-10-CM | POA: Diagnosis present

## 2014-08-01 LAB — COMPREHENSIVE METABOLIC PANEL
ALT: 18 U/L (ref 0–35)
AST: 17 U/L (ref 0–37)
Albumin: 3.7 g/dL (ref 3.5–5.2)
Alkaline Phosphatase: 107 U/L (ref 39–117)
Anion gap: 11 (ref 5–15)
BUN: 14 mg/dL (ref 6–23)
CO2: 28 mEq/L (ref 19–32)
Calcium: 9.1 mg/dL (ref 8.4–10.5)
Chloride: 104 mEq/L (ref 96–112)
Creatinine, Ser: 0.81 mg/dL (ref 0.50–1.10)
GFR calc Af Amer: 90 mL/min — ABNORMAL LOW (ref >90)
GFR calc non Af Amer: 77 mL/min — ABNORMAL LOW (ref >90)
Glucose, Bld: 93 mg/dL (ref 70–99)
Potassium: 4.1 mEq/L (ref 3.7–5.3)
Sodium: 143 mEq/L (ref 137–147)
Total Bilirubin: <0.2 mg/dL — ABNORMAL LOW (ref 0.3–1.2)
Total Protein: 7.3 g/dL (ref 6.0–8.3)

## 2014-08-01 LAB — CBC WITH DIFFERENTIAL/PLATELET
Basophils Absolute: 0 10*3/uL (ref 0.0–0.1)
Basophils Relative: 0 % (ref 0–1)
Eosinophils Absolute: 0.1 10*3/uL (ref 0.0–0.7)
Eosinophils Relative: 2 % (ref 0–5)
HCT: 33.7 % — ABNORMAL LOW (ref 36.0–46.0)
Hemoglobin: 11.1 g/dL — ABNORMAL LOW (ref 12.0–15.0)
Lymphocytes Relative: 31 % (ref 12–46)
Lymphs Abs: 2.4 10*3/uL (ref 0.7–4.0)
MCH: 28.5 pg (ref 26.0–34.0)
MCHC: 32.9 g/dL (ref 30.0–36.0)
MCV: 86.4 fL (ref 78.0–100.0)
Monocytes Absolute: 0.4 10*3/uL (ref 0.1–1.0)
Monocytes Relative: 5 % (ref 3–12)
Neutro Abs: 4.8 10*3/uL (ref 1.7–7.7)
Neutrophils Relative %: 62 % (ref 43–77)
Platelets: 312 10*3/uL (ref 150–400)
RBC: 3.9 MIL/uL (ref 3.87–5.11)
RDW: 15.7 % — ABNORMAL HIGH (ref 11.5–15.5)
WBC: 7.7 10*3/uL (ref 4.0–10.5)

## 2014-08-01 LAB — URINE MICROSCOPIC-ADD ON

## 2014-08-01 LAB — URINALYSIS, ROUTINE W REFLEX MICROSCOPIC
Bilirubin Urine: NEGATIVE
Glucose, UA: NEGATIVE mg/dL
Ketones, ur: 15 mg/dL — AB
Nitrite: POSITIVE — AB
Protein, ur: 30 mg/dL — AB
Specific Gravity, Urine: 1.029 (ref 1.005–1.030)
Urobilinogen, UA: 0.2 mg/dL (ref 0.0–1.0)
pH: 5.5 (ref 5.0–8.0)

## 2014-08-01 LAB — I-STAT TROPONIN, ED
Troponin i, poc: 0 ng/mL (ref 0.00–0.08)
Troponin i, poc: 0 ng/mL (ref 0.00–0.08)

## 2014-08-01 MED ORDER — HYDROMORPHONE HCL 1 MG/ML IJ SOLN
1.0000 mg | Freq: Once | INTRAMUSCULAR | Status: AC
  Administered 2014-08-01: 1 mg via INTRAVENOUS
  Filled 2014-08-01: qty 1

## 2014-08-01 MED ORDER — SODIUM CHLORIDE 0.9 % IV BOLUS (SEPSIS)
1000.0000 mL | Freq: Once | INTRAVENOUS | Status: AC
  Administered 2014-08-01: 1000 mL via INTRAVENOUS

## 2014-08-01 MED ORDER — CEPHALEXIN 500 MG PO CAPS: 500.0000 mg | ORAL_CAPSULE | Freq: Four times a day (QID) | ORAL | Status: DC

## 2014-08-01 MED ORDER — HYDROCODONE-ACETAMINOPHEN 5-325 MG PO TABS: 1.0000 | ORAL_TABLET | Freq: Four times a day (QID) | ORAL | Status: DC | PRN

## 2014-08-01 MED ORDER — DEXTROSE 5 % IV SOLN
1.0000 g | Freq: Once | INTRAVENOUS | Status: AC
  Administered 2014-08-01: 1 g via INTRAVENOUS
  Filled 2014-08-01: qty 10

## 2014-08-01 MED ORDER — PHENAZOPYRIDINE HCL 200 MG PO TABS: 200.0000 mg | ORAL_TABLET | Freq: Three times a day (TID) | ORAL | Status: DC | PRN

## 2014-08-01 MED ORDER — ONDANSETRON HCL 4 MG/2ML IJ SOLN
4.0000 mg | Freq: Once | INTRAMUSCULAR | Status: AC
  Administered 2014-08-01: 4 mg via INTRAVENOUS
  Filled 2014-08-01: qty 2

## 2014-08-01 NOTE — ED Notes (Signed)
Pt reports UTI or bladder infection and pt reports "i think the poison may be in my system."  Pt reports chest pain and left arm swelled last night also.  Pt is currently having no chest pain.

## 2014-08-01 NOTE — ED Notes (Signed)
Pt back from CT

## 2014-08-01 NOTE — Discharge Instructions (Signed)
You have been diagnosed by your caregiver as having chest wall pain. SEEK IMMEDIATE MEDICAL ATTENTION IF: You develop a fever.  Your chest pains become severe or intolerable.  You develop new, unexplained symptoms (problems).  You develop shortness of breath, nausea, vomiting, sweating or feel light headed.  You develop a new cough or you cough up blood.   Urinary Tract Infection Urinary tract infections (UTIs) can develop anywhere along your urinary tract. Your urinary tract is your body's drainage system for removing wastes and extra water. Your urinary tract includes two kidneys, two ureters, a bladder, and a urethra. Your kidneys are a pair of bean-shaped organs. Each kidney is about the size of your fist. They are located below your ribs, one on each side of your spine. CAUSES Infections are caused by microbes, which are microscopic organisms, including fungi, viruses, and bacteria. These organisms are so small that they can only be seen through a microscope. Bacteria are the microbes that most commonly cause UTIs. SYMPTOMS  Symptoms of UTIs may vary by age and gender of the patient and by the location of the infection. Symptoms in young women typically include a frequent and intense urge to urinate and a painful, burning feeling in the bladder or urethra during urination. Older women and men are more likely to be tired, shaky, and weak and have muscle aches and abdominal pain. A fever may mean the infection is in your kidneys. Other symptoms of a kidney infection include pain in your back or sides below the ribs, nausea, and vomiting. DIAGNOSIS To diagnose a UTI, your caregiver will ask you about your symptoms. Your caregiver also will ask to provide a urine sample. The urine sample will be tested for bacteria and white blood cells. White blood cells are made by your body to help fight infection. TREATMENT  Typically, UTIs can be treated with medication. Because most UTIs are caused by a  bacterial infection, they usually can be treated with the use of antibiotics. The choice of antibiotic and length of treatment depend on your symptoms and the type of bacteria causing your infection. HOME CARE INSTRUCTIONS  If you were prescribed antibiotics, take them exactly as your caregiver instructs you. Finish the medication even if you feel better after you have only taken some of the medication.  Drink enough water and fluids to keep your urine clear or pale yellow.  Avoid caffeine, tea, and carbonated beverages. They tend to irritate your bladder.  Empty your bladder often. Avoid holding urine for long periods of time.  Empty your bladder before and after sexual intercourse.  After a bowel movement, women should cleanse from front to back. Use each tissue only once. SEEK MEDICAL CARE IF:   You have back pain.  You develop a fever.  Your symptoms do not begin to resolve within 3 days. SEEK IMMEDIATE MEDICAL CARE IF:   You have severe back pain or lower abdominal pain.  You develop chills.  You have nausea or vomiting.  You have continued burning or discomfort with urination. MAKE SURE YOU:   Understand these instructions.  Will watch your condition.  Will get help right away if you are not doing well or get worse. Document Released: 05/28/2005 Document Revised: 02/17/2012 Document Reviewed: 09/26/2011 [facility] Patient Information 2015 [place], [place]. This information is not intended to replace advice given to you by your health care provider. Make sure you discuss any questions you have with your health care provider.

## 2014-08-01 NOTE — ED Notes (Signed)
Pt seen here earlier today for same complaint.  Pt sts she wasn't bleeding earlier today but now has blood in her urine.  Pt crying on arrival.  Pt now having pain on right side that radiates down into groin.  Sts it feels like pressure. Pt reports nausea, denies vomiting.

## 2014-08-01 NOTE — ED Provider Notes (Signed)
CSN: [id number]     Arrival date & time 08/01/14  1253 History   First MD Initiated Contact with Patient 08/01/14 1516     Chief Complaint  Patient presents with  . Urinary Tract Infection  . Pleurisy     (Consider location/radiation/quality/duration/timing/severity/associated sxs/prior Treatment) HPI Comments: Valerie Vazquez is a(n) 58 y.o. female who presents ti the ED with cc of dysuria and left arm pain . The patient has had 4-5 days of gradually worsening dysuria, foul odor of urine. She has mild suprapubic pain and this is consistent with previous UIT sxs. She denies vaginal sxs. She also c/o Left axillary pain that radiates down her arm. Onset 3 days ago, worse with mvmt, better with rest. Feels "like a balloon" in her armpit.  Denies injury. Denies fevers, chills, myalgias, arthralgias. Denies DOE, SOB, chest tightness or pressure, radiation to left arm, jaw or back, or diaphoresis. Den Denies headaches, light headedness, weakness, visual disturbances. Denies abdominal pain, nausea, vomiting, diarrhea or constipation. 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 female presenting with dysuria. The history is provided by the patient.  Dysuria   Past Medical History  Diagnosis Date  . Arthritis   . Spinal stenosis   . Spondylosis   . History of neck surgery 10/28/2013  . Hypertension   . DVT (deep venous thrombosis)    Past Surgical History  Procedure Laterality Date  . Tonsilectomy, adenoidectomy, bilateral myringotomy and tubes    . Abdominal hysterectomy    . Hernia repair    . Excision/release bursa hip    . Appendectomy    . Cholecystectomy    . Left hip replacement  04-06-2014   History reviewed. No pertinent family history. History  Substance Use Topics  . Smoking status: Former Smoker    Quit date: 04/12/1979  . Smokeless tobacco: Not on file  . Alcohol Use: No   OB History    No data available     Review of Systems   Genitourinary: Positive for dysuria.  All other systems reviewed and are negative.     Allergies  Abilify; Augmentin; Bactrim; Ceftin; Diclofenac; Indocin; Lisinopril; Morphine and related; Penicillins; Simvastatin; Wellbutrin; and Quetiapine fumarate  Home Medications   Prior to Admission medications   Medication Sig Start Date End Date Taking? Authorizing Provider  bisacodyl (DULCOLAX) 5 MG EC tablet Take 5 mg by mouth daily as needed for mild constipation or moderate constipation.   Yes Historical Provider, MD  cyclobenzaprine (FLEXERIL) 5 MG tablet Take 10 mg by mouth 3 (three) times daily.    Yes Historical Provider, MD  dicyclomine (BENTYL) 10 MG capsule Take 20 mg by mouth 4 (four) times daily -  before meals and at bedtime.    Yes Historical Provider, MD  diltiazem (CARDIZEM CD) 120 MG 24 hr capsule Take 120 mg by mouth daily.   Yes Historical Provider, MD  docusate sodium (COLACE) 100 MG capsule Take 100 mg by mouth daily.   Yes Historical Provider, MD  gabapentin (NEURONTIN) 600 MG tablet Take 600 mg by mouth 3 (three) times daily.   Yes Historical Provider, MD  hyoscyamine (LEVSIN/SL) 0.125 MG SL tablet Place 1 tablet (0.125 mg total) under the tongue every 4 (four) hours as needed. Patient taking differently: Place 0.125 mg under the tongue every 4 (four) hours as needed for cramping.  06/22/14  Yes Tanna Furry, MD  loratadine (CLARITIN) 10 MG tablet Take  10 mg by mouth daily.   Yes Historical Provider, MD  Menthol-Camphor (ICY HOT ARTHRITIS PAIN RELIEF) 16-4 % LOTN Apply 1 application topically daily as needed (for pain).   Yes Historical Provider, MD  metoCLOPramide (REGLAN) 10 MG tablet Take 10 mg by mouth 4 (four) times daily -  before meals and at bedtime.  07/10/14  Yes Historical Provider, MD  pantoprazole (PROTONIX) 40 MG tablet Take 40 mg by mouth daily.   Yes Historical Provider, MD  traZODone (DESYREL) 100 MG tablet Take 100 mg by mouth at bedtime.    Yes Historical  Provider, MD  cephALEXin (KEFLEX) 500 MG capsule Take 1 capsule (500 mg total) by mouth 4 (four) times daily. 08/01/14   Margarita Mail, PA-C  dicyclomine (BENTYL) 20 MG tablet Take 1 tablet (20 mg total) by mouth 2 (two) times daily. Patient not taking: Reported on 08/01/2014 06/22/14   Tanna Furry, MD  HYDROcodone-acetaminophen [facility]) 5-325 MG per tablet Take 1 tablet by mouth every 6 (six) hours as needed. 08/01/14   Margarita Mail, PA-C  phenazopyridine (PYRIDIUM) 200 MG tablet Take 1 tablet (200 mg total) by mouth 3 (three) times daily as needed (pain with urination). 08/01/14   Margarita Mail, PA-C  polyethylene glycol (MIRALAX / GLYCOLAX) packet Take 17 g by mouth 2 (two) times daily. Twice daily until normal bowel movements, then once daily as needed for constipation. Patient not taking: Reported on 08/01/2014 07/19/14   Noland Fordyce, PA-C   BP 165/85 mmHg  Pulse 81  Temp(Src) 98.2 F (36.8 C) (Oral)  Resp 10  Ht 5\' 6"  (1.676 m)  Wt 190 lb (86.183 kg)  BMI 30.68 kg/m2  SpO2 100% Physical Exam  Constitutional: She is oriented to person, place, and time. She appears well-developed and well-nourished. No distress.  HENT:  Head: Normocephalic and atraumatic.  Eyes: Conjunctivae are normal. No scleral icterus.  Neck: Normal range of motion.  Cardiovascular: Normal rate, regular rhythm and normal heart sounds.  Exam reveals no gallop and no friction rub.   No murmur heard. Pulmonary/Chest: Effort normal and breath sounds normal. No respiratory distress. She exhibits tenderness (pain with palpation of the  left chest wall. pain with arm mvmt).  Abdominal: Soft. Bowel sounds are normal. She exhibits no distension and no mass. There is no tenderness. There is no guarding.  Neurological: She is alert and oriented to person, place, and time.  Skin: Skin is warm and dry. She is not diaphoretic.    ED Course  Procedures (including critical care time) Labs Review Labs Reviewed  URINALYSIS,  ROUTINE W REFLEX MICROSCOPIC - Abnormal; Notable for the following:    APPearance CLOUDY (*)    Hgb urine dipstick MODERATE (*)    Ketones, ur 15 (*)    Protein, ur 30 (*)    Nitrite POSITIVE (*)    Leukocytes, UA LARGE (*)    All other components within normal limits  URINE MICROSCOPIC-ADD ON - Abnormal; Notable for the following:    Squamous Epithelial / LPF FEW (*)    Bacteria, UA MANY (*)    All other components within normal limits  COMPREHENSIVE METABOLIC PANEL - Abnormal; Notable for the following:    Total Bilirubin <0.2 (*)    GFR calc non Af Amer 77 (*)    GFR calc Af Amer 90 (*)    All other components within normal limits  CBC WITH DIFFERENTIAL - Abnormal; Notable for the following:    Hemoglobin 11.1 (*)  HCT 33.7 (*)    RDW 15.7 (*)    All other components within normal limits  I-STAT TROPOININ, ED  I-STAT TROPOININ, ED    Imaging Review Dg Chest 2 View  08/01/2014   CLINICAL DATA:  Swelling about the left axilla and chest pain for 1-1/2 weeks.  EXAM: CHEST  2 VIEW  COMPARISON:  Single view of the chest 07/19/2014. CT chest 06/22/2014.  FINDINGS: The lungs are clear. Heart size is normal. No pneumothorax or pleural effusion is identified. IVC filter and cholecystectomy clips are noted.  IMPRESSION: No acute disease.   Electronically Signed   By: Inge Rise M.D.   On: 08/01/2014 16:52     EKG Interpretation None      ECG interpretation   Date: 08/03/2014  Rate: regular  Rhythm: normal sinus rhythm  QRS Axis: normal  Intervals: normal  ST/T Wave abnormalities: normal  Conduction Disutrbances: none  Narrative Interpretation:   Old EKG Reviewed: No significant changes noted    MDM   Final diagnoses:  UTI (lower urinary tract infection)  Left arm pain    Patient with c/o left extremity pain. There are no clinical signs of DVT. However, the patient does have a history. We will obtain a duplex ultrasound of the left upper extremity to rule out  DVT. The patient does have a urinary tract infection. We are treating her with IV Rocephin. No CVA tenderness or signs of pyelonephritis. Patient is afebrile without leukocytosis. EKG is normal and unchanged without signs of ischemia or arrhythmia. Initial troponin is negative. We'll obtain a delta troponin. Patient pain is treated.     7:14 PM BP 165/85 mmHg  Pulse 81  Temp(Src) 98.2 F (36.8 C) (Oral)  Resp 10  Ht 5\' 6"  (1.676 m)  Wt 190 lb (86.183 kg)  BMI 30.68 kg/m2  SpO2 100% Patient delta troponin is negative. Heart score is low risk today. Her upper extremity duplex is negative.  Patient is to be discharged with recommendation to follow up with PCP in regards to today's hospital visit. Chest pain is not likely of cardiac or pulmonary etiology d/t presentation, perc negative, VSS, no tracheal deviation, no JVD or new murmur, RRR, breath sounds equal bilaterally, EKG without acute abnormalities, negative troponin, and negative CXR. Pt has been advised start a PPI and return to the ED is CP becomes exertional, associated with diaphoresis or nausea, radiates to left jaw/arm, worsens or becomes concerning in any way. Pt appears reliable for follow up and is agreeable to discharge.   Case has been discussed with and seen by Dr.  Christy Gentles who agrees with the above plan to discharge.          Margarita Mail, PA-C 08/03/14 [place], MD 08/04/14 [phone]

## 2014-08-01 NOTE — ED Notes (Signed)
Pt gone to xray

## 2014-08-01 NOTE — ED Notes (Signed)
uti symptoms: cloudy, foul odor and green.  Lt. Arm swelling and pleuritic pain radiating to back.

## 2014-08-01 NOTE — ED Provider Notes (Signed)
CSN: [id number]     Arrival date & time 08/01/14  1253 History   First MD Initiated Contact with Patient 08/01/14 1516     Chief Complaint  Patient presents with  . Urinary Tract Infection  . Pleurisy     (Consider location/radiation/quality/duration/timing/severity/associated sxs/prior Treatment) HPI   This is a 58 year old female who presents emergency Department with chief complaint of UTI symptoms and left arm pain. The patient states that for the past week. She noticed change in the color and foul odor of urine. Over the last 2 days she has developed severe dysuria. She denies back pain or suprapubic pain. The patient has also noticed a couple days of left arm pain. She describes the pain as a feeling of swelling and a feeling of "like a balloon" under the left axilla. Patient states she applied hot packs without relief. She states the pain then began to radiate down the left arm and into the left chest as well. She has a past medical history of myocardial infarction, diabetes, hypertension. She denies pleuritic chest pain, hemoptysis. She does have a history of DVTs and has had an IVC filter placed. She is not on any anticoagulation therapies. She was unable to tolerate them.   Past Medical History  Diagnosis Date  . Arthritis   . Spinal stenosis   . Spondylosis   . History of neck surgery 10/28/2013  . Hypertension   . DVT (deep venous thrombosis)    Past Surgical History  Procedure Laterality Date  . Tonsilectomy, adenoidectomy, bilateral myringotomy and tubes    . Abdominal hysterectomy    . Hernia repair    . Excision/release bursa hip    . Appendectomy    . Cholecystectomy    . Left hip replacement  04-06-2014   History reviewed. No pertinent family history. History  Substance Use Topics  . Smoking status: Former Smoker    Quit date: 04/12/1979  . Smokeless tobacco: Not on file  . Alcohol Use: No   OB History    No data available     Review of Systems  Ten  systems reviewed and are negative for acute change, except as noted in the HPI.    Allergies  Abilify; Bactrim; Ceftin; Indocin; Lisinopril; Morphine and related; Penicillins; Percocet; and Wellbutrin  Home Medications   Prior to Admission medications   Medication Sig Start Date End Date Taking? Authorizing Provider  bisacodyl (DULCOLAX) 5 MG EC tablet Take 5 mg by mouth daily as needed for mild constipation or moderate constipation.    Historical Provider, MD  cyclobenzaprine (FLEXERIL) 5 MG tablet Take 10 mg by mouth 3 (three) times daily.     Historical Provider, MD  dicyclomine (BENTYL) 10 MG capsule Take 10 mg by mouth 4 (four) times daily -  before meals and at bedtime.    Historical Provider, MD  dicyclomine (BENTYL) 20 MG tablet Take 1 tablet (20 mg total) by mouth 2 (two) times daily. 06/22/14   Tanna Furry, MD  diltiazem (CARDIZEM CD) 120 MG 24 hr capsule Take 120 mg by mouth daily.    Historical Provider, MD  docusate sodium (COLACE) 100 MG capsule Take 100 mg by mouth daily.    Historical Provider, MD  gabapentin (NEURONTIN) 600 MG tablet Take 600 mg by mouth 3 (three) times daily.    Historical Provider, MD  hyoscyamine (LEVSIN/SL) 0.125 MG SL tablet Place 1 tablet (0.125 mg total) under the tongue every 4 (four) hours as needed. 06/22/14  Tanna Furry, MD  Menthol-Camphor (ICY HOT ARTHRITIS PAIN RELIEF) 16-4 % LOTN Apply 1 application topically daily as needed (for pain).    Historical Provider, MD  oxyCODONE (OXY IR/ROXICODONE) 5 MG immediate release tablet Take 2.5-5 mg by mouth every 6 (six) hours as needed for moderate pain or severe pain.    Historical Provider, MD  pantoprazole (PROTONIX) 40 MG tablet Take 40 mg by mouth daily.    Historical Provider, MD  polyethylene glycol (MIRALAX / GLYCOLAX) packet Take 17 g by mouth 2 (two) times daily. Twice daily until normal bowel movements, then once daily as needed for constipation. 07/19/14   Noland Fordyce, PA-C  traZODone (DESYREL)  100 MG tablet Take 100 mg by mouth at bedtime.     Historical Provider, MD   BP 135/89 mmHg  Pulse 74  Temp(Src) 98.2 F (36.8 C) (Oral)  Resp 14  Ht 5\' 6"  (1.676 m)  Wt 190 lb (86.183 kg)  BMI 30.68 kg/m2  SpO2 98% Physical Exam  Constitutional: She is oriented to person, place, and time. She appears well-developed and well-nourished. No distress.  HENT:  Head: Normocephalic and atraumatic.  Mouth/Throat: Oropharyngeal exudate present.  Eyes: Conjunctivae are normal. No scleral icterus.  Neck: Normal range of motion.  Cardiovascular: Normal rate, regular rhythm and normal heart sounds.  Exam reveals no gallop and no friction rub.   No murmur heard. Intact radial pulse. Left wrist is swollen as compared to the right, however this is chronic per patient.   Pulmonary/Chest: Effort normal and breath sounds normal. No respiratory distress. She exhibits tenderness (tender to palpation in the left pectoralis muscle, pain with left arm movement.).  Abdominal: Soft. Bowel sounds are normal. She exhibits no distension and no mass. There is no tenderness. There is no guarding.  Neurological: She is alert and oriented to person, place, and time.  Skin: Skin is warm and dry. She is not diaphoretic.    ED Course  Procedures (including critical care time) Labs Review Labs Reviewed  URINALYSIS, ROUTINE W REFLEX MICROSCOPIC - Abnormal; Notable for the following:    APPearance CLOUDY (*)    Hgb urine dipstick MODERATE (*)    Ketones, ur 15 (*)    Protein, ur 30 (*)    Nitrite POSITIVE (*)    Leukocytes, UA LARGE (*)    All other components within normal limits  URINE MICROSCOPIC-ADD ON - Abnormal; Notable for the following:    Squamous Epithelial / LPF FEW (*)    Bacteria, UA MANY (*)    All other components within normal limits  COMPREHENSIVE METABOLIC PANEL - Abnormal; Notable for the following:    Total Bilirubin <0.2 (*)    GFR calc non Af Amer 77 (*)    GFR calc Af Amer 90 (*)     All other components within normal limits  CBC WITH DIFFERENTIAL - Abnormal; Notable for the following:    Hemoglobin 11.1 (*)    HCT 33.7 (*)    RDW 15.7 (*)    All other components within normal limits  I-STAT TROPOININ, ED    Imaging Review Dg Chest 2 View  08/01/2014   CLINICAL DATA:  Swelling about the left axilla and chest pain for 1-1/2 weeks.  EXAM: CHEST  2 VIEW  COMPARISON:  Single view of the chest 07/19/2014. CT chest 06/22/2014.  FINDINGS: The lungs are clear. Heart size is normal. No pneumothorax or pleural effusion is identified. IVC filter and cholecystectomy clips are noted.  IMPRESSION: No acute disease.   Electronically Signed   By: Inge Rise M.D.   On: 08/01/2014 16:52     EKG Interpretation None      MDM   Final diagnoses:  Chest pain    The patient rates her pain currently at 4 out of 10. She has a score of 3, making her low risk for major adverse cardiac event. We'll repeat a troponin. I have ordered a venous duplex of the left upper extremity. Given her propensity for DVT formation, although she does not have any clinical signs on the left arm. Patient has a positive UTI and is given IV Rocephin.  Margarita Mail, PA-C 08/08/14 [place], MD 08/10/14 [phone]

## 2014-08-01 NOTE — ED Provider Notes (Signed)
Patient seen/examined in the Emergency Department in conjunction with Midlevel Provider [place] Patient reports UTI symptoms as well as Chest pain Exam : awake/alert, no distress, resting comfortably Plan: labs/imaging pending.  UTI is noted.    Date: 08/01/2014 1503  Rate: 72  Rhythm: normal sinus rhythm  QRS Axis: normal  Intervals: normal  ST/T Wave abnormalities: inverted t waves inferior  Conduction Disutrbances:none  Narrative Interpretation: no STEMI  Old EKG Reviewed: unchanged from 06/21/14    Sharyon Cable, MD 08/01/14 1732

## 2014-08-01 NOTE — Progress Notes (Signed)
Left upper extremity venous duplex completed:  No evidence of DVT or superficial thrombosis.    

## 2014-08-02 ENCOUNTER — Emergency Department (HOSPITAL_COMMUNITY)
Admission: EM | Admit: 2014-08-02 | Discharge: 2014-08-02 | Disposition: A | Discharge status: Home / Self Care | Payer: Medicare Other | Source: Home / Self Care | Attending: Emergency Medicine | Admitting: Emergency Medicine

## 2014-08-02 DIAGNOSIS — R319 Hematuria, unspecified: Secondary | ICD-10-CM

## 2014-08-02 DIAGNOSIS — N390 Urinary tract infection, site not specified: Secondary | ICD-10-CM

## 2014-08-02 MED ORDER — CEPHALEXIN 250 MG PO CAPS
500.0000 mg | ORAL_CAPSULE | Freq: Once | ORAL | Status: AC
  Administered 2014-08-02: 500 mg via ORAL
  Filled 2014-08-02: qty 2

## 2014-08-02 NOTE — ED Notes (Signed)
Pt reports not feeling like she can urinate. Pt used restroom when she was brought back to exam room

## 2014-08-02 NOTE — ED Notes (Signed)
Charge Rn aware that patient is waiting for social work in the morning. Reports given to [place] in [place] for holding

## 2014-08-02 NOTE — ED Notes (Signed)
Pt c/o low abdominal pain and L groin pain. Pt was seen here tonight for burning with urination. Reports feeling pressure on the way home and when she went to the restroom, pt noticed pink color in toiler and red blood in toilet. Pt states pressure is worse when attempting to urinate and pain increases.

## 2014-08-02 NOTE — Discharge Instructions (Signed)

## 2014-08-02 NOTE — ED Notes (Signed)
Pt made aware to return if symptoms worsen or if any life threatening symptoms occur.  Social work in room discussing resources for pt.

## 2014-08-02 NOTE — ED Notes (Signed)
Called social work and spoke to [place], she advises that she will be down as soon as she can to see patient

## 2014-08-02 NOTE — ED Notes (Signed)
CALLED SOCIAL WORK FOR THEM TO COME SEE PATIENT
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
## 2014-08-02 NOTE — ED Notes (Signed)
Social work at bedside discussing resources

## 2014-08-02 NOTE — ED Notes (Signed)
PT ON PHONE TALKING TO [place]
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
## 2014-08-07 DIAGNOSIS — Z96642 Presence of left artificial hip joint: Secondary | ICD-10-CM | POA: Diagnosis not present

## 2014-08-07 DIAGNOSIS — M1990 Unspecified osteoarthritis, unspecified site: Secondary | ICD-10-CM | POA: Diagnosis not present

## 2014-08-07 DIAGNOSIS — M7989 Other specified soft tissue disorders: Secondary | ICD-10-CM | POA: Diagnosis not present

## 2014-08-07 DIAGNOSIS — I87099 Postthrombotic syndrome with other complications of unspecified lower extremity: Secondary | ICD-10-CM | POA: Diagnosis not present

## 2014-08-07 DIAGNOSIS — M79609 Pain in unspecified limb: Secondary | ICD-10-CM | POA: Diagnosis not present

## 2014-08-12 DIAGNOSIS — H9209 Otalgia, unspecified ear: Secondary | ICD-10-CM | POA: Diagnosis not present

## 2014-08-12 DIAGNOSIS — H9203 Otalgia, bilateral: Secondary | ICD-10-CM | POA: Diagnosis not present

## 2014-08-16 DIAGNOSIS — S43439A Superior glenoid labrum lesion of unspecified shoulder, initial encounter: Secondary | ICD-10-CM | POA: Insufficient documentation

## 2014-08-16 DIAGNOSIS — M47812 Spondylosis without myelopathy or radiculopathy, cervical region: Secondary | ICD-10-CM | POA: Diagnosis not present

## 2014-08-16 DIAGNOSIS — M65812 Other synovitis and tenosynovitis, left shoulder: Secondary | ICD-10-CM | POA: Diagnosis not present

## 2014-08-16 DIAGNOSIS — M25512 Pain in left shoulder: Secondary | ICD-10-CM | POA: Diagnosis not present

## 2014-08-16 DIAGNOSIS — S43432A Superior glenoid labrum lesion of left shoulder, initial encounter: Secondary | ICD-10-CM | POA: Diagnosis not present

## 2014-08-16 DIAGNOSIS — M65819 Other synovitis and tenosynovitis, unspecified shoulder: Secondary | ICD-10-CM | POA: Insufficient documentation

## 2014-08-18 ENCOUNTER — Ambulatory Visit: Payer: Self-pay | Admitting: Surgery

## 2014-08-18 DIAGNOSIS — M7552 Bursitis of left shoulder: Secondary | ICD-10-CM | POA: Diagnosis not present

## 2014-08-30 DIAGNOSIS — M7542 Impingement syndrome of left shoulder: Secondary | ICD-10-CM | POA: Diagnosis not present

## 2014-08-30 DIAGNOSIS — M7540 Impingement syndrome of unspecified shoulder: Secondary | ICD-10-CM | POA: Insufficient documentation

## 2014-08-30 DIAGNOSIS — M65812 Other synovitis and tenosynovitis, left shoulder: Secondary | ICD-10-CM | POA: Diagnosis not present

## 2014-09-11 DIAGNOSIS — M25551 Pain in right hip: Secondary | ICD-10-CM | POA: Diagnosis not present

## 2014-09-11 DIAGNOSIS — M7061 Trochanteric bursitis, right hip: Secondary | ICD-10-CM | POA: Diagnosis not present

## 2014-09-19 ENCOUNTER — Inpatient Hospital Stay (HOSPITAL_COMMUNITY)
Admission: EM | Admit: 2014-09-19 | Discharge: 2014-09-24 | DRG: 176 | Disposition: A | Discharge status: Home / Self Care | Payer: Medicare Other | Attending: Internal Medicine | Admitting: Internal Medicine

## 2014-09-19 ENCOUNTER — Emergency Department (HOSPITAL_COMMUNITY): Payer: Medicare Other

## 2014-09-19 ENCOUNTER — Encounter (HOSPITAL_COMMUNITY): Payer: Self-pay | Admitting: Neurology

## 2014-09-19 DIAGNOSIS — Z86718 Personal history of other venous thrombosis and embolism: Secondary | ICD-10-CM | POA: Diagnosis not present

## 2014-09-19 DIAGNOSIS — Z96642 Presence of left artificial hip joint: Secondary | ICD-10-CM | POA: Diagnosis present

## 2014-09-19 DIAGNOSIS — I951 Orthostatic hypotension: Secondary | ICD-10-CM | POA: Diagnosis not present

## 2014-09-19 DIAGNOSIS — Z87891 Personal history of nicotine dependence: Secondary | ICD-10-CM | POA: Diagnosis not present

## 2014-09-19 DIAGNOSIS — I10 Essential (primary) hypertension: Secondary | ICD-10-CM | POA: Diagnosis not present

## 2014-09-19 DIAGNOSIS — I2699 Other pulmonary embolism without acute cor pulmonale: Principal | ICD-10-CM | POA: Diagnosis present

## 2014-09-19 DIAGNOSIS — K219 Gastro-esophageal reflux disease without esophagitis: Secondary | ICD-10-CM | POA: Diagnosis present

## 2014-09-19 DIAGNOSIS — K5900 Constipation, unspecified: Secondary | ICD-10-CM | POA: Diagnosis present

## 2014-09-19 DIAGNOSIS — R52 Pain, unspecified: Secondary | ICD-10-CM

## 2014-09-19 DIAGNOSIS — R0789 Other chest pain: Secondary | ICD-10-CM | POA: Diagnosis not present

## 2014-09-19 DIAGNOSIS — E119 Type 2 diabetes mellitus without complications: Secondary | ICD-10-CM | POA: Diagnosis present

## 2014-09-19 DIAGNOSIS — J40 Bronchitis, not specified as acute or chronic: Secondary | ICD-10-CM | POA: Diagnosis present

## 2014-09-19 DIAGNOSIS — J329 Chronic sinusitis, unspecified: Secondary | ICD-10-CM | POA: Diagnosis present

## 2014-09-19 DIAGNOSIS — I152 Hypertension secondary to endocrine disorders: Secondary | ICD-10-CM | POA: Diagnosis present

## 2014-09-19 DIAGNOSIS — R791 Abnormal coagulation profile: Secondary | ICD-10-CM | POA: Diagnosis present

## 2014-09-19 DIAGNOSIS — M719 Bursopathy, unspecified: Secondary | ICD-10-CM

## 2014-09-19 DIAGNOSIS — E1159 Type 2 diabetes mellitus with other circulatory complications: Secondary | ICD-10-CM | POA: Diagnosis present

## 2014-09-19 DIAGNOSIS — R609 Edema, unspecified: Secondary | ICD-10-CM

## 2014-09-19 DIAGNOSIS — R0602 Shortness of breath: Secondary | ICD-10-CM

## 2014-09-19 DIAGNOSIS — M1611 Unilateral primary osteoarthritis, right hip: Secondary | ICD-10-CM | POA: Diagnosis present

## 2014-09-19 DIAGNOSIS — Z86711 Personal history of pulmonary embolism: Secondary | ICD-10-CM

## 2014-09-19 HISTORY — DX: Dyskinesia of esophagus: K22.4

## 2014-09-19 HISTORY — DX: Noninfective gastroenteritis and colitis, unspecified: K52.9

## 2014-09-19 HISTORY — DX: Headache, unspecified: R51.9

## 2014-09-19 HISTORY — DX: Migraine, unspecified, not intractable, without status migrainosus: G43.909

## 2014-09-19 HISTORY — DX: Acute myocardial infarction, unspecified: I21.9

## 2014-09-19 HISTORY — DX: Depression, unspecified: F32.A

## 2014-09-19 HISTORY — DX: Major depressive disorder, single episode, unspecified: F32.9

## 2014-09-19 HISTORY — DX: Other pulmonary embolism without acute cor pulmonale: I26.99

## 2014-09-19 HISTORY — DX: Other chronic pain: G89.29

## 2014-09-19 HISTORY — DX: Gastritis, unspecified, without bleeding: K29.70

## 2014-09-19 HISTORY — DX: Sleep apnea, unspecified: G47.30

## 2014-09-19 HISTORY — DX: Gastro-esophageal reflux disease without esophagitis: K21.9

## 2014-09-19 HISTORY — DX: Headache: R51

## 2014-09-19 HISTORY — DX: Unspecified osteoarthritis, unspecified site: M19.90

## 2014-09-19 HISTORY — DX: Pure hypercholesterolemia, unspecified: E78.00

## 2014-09-19 HISTORY — DX: Post-traumatic stress disorder, unspecified: F43.10

## 2014-09-19 HISTORY — DX: Pneumonia, unspecified organism: J18.9

## 2014-09-19 HISTORY — DX: Anemia, unspecified: D64.9

## 2014-09-19 HISTORY — DX: Type 2 diabetes mellitus without complications: E11.9

## 2014-09-19 HISTORY — DX: Irritable bowel syndrome, unspecified: K58.9

## 2014-09-19 HISTORY — DX: Schizoaffective disorder, unspecified: F25.9

## 2014-09-19 HISTORY — DX: Personal history of pulmonary embolism: Z86.711

## 2014-09-19 HISTORY — DX: Dorsalgia, unspecified: M54.9

## 2014-09-19 HISTORY — DX: Personal history of other medical treatment: Z92.89

## 2014-09-19 LAB — BRAIN NATRIURETIC PEPTIDE: B Natriuretic Peptide: 28.9 pg/mL (ref 0.0–100.0)

## 2014-09-19 LAB — BASIC METABOLIC PANEL
Anion gap: 8 (ref 5–15)
BUN: 14 mg/dL (ref 6–23)
CO2: 28 mmol/L (ref 19–32)
Calcium: 9.3 mg/dL (ref 8.4–10.5)
Chloride: 105 mEq/L (ref 96–112)
Creatinine, Ser: 0.88 mg/dL (ref 0.50–1.10)
GFR calc Af Amer: 81 mL/min — ABNORMAL LOW (ref >90)
GFR calc non Af Amer: 70 mL/min — ABNORMAL LOW (ref >90)
Glucose, Bld: 143 mg/dL — ABNORMAL HIGH (ref 70–99)
Potassium: 4.3 mmol/L (ref 3.5–5.1)
Sodium: 141 mmol/L (ref 135–145)

## 2014-09-19 LAB — TSH: TSH: 2.596 u[IU]/mL (ref 0.350–4.500)

## 2014-09-19 LAB — CBC
HCT: 33.3 % — ABNORMAL LOW (ref 36.0–46.0)
Hemoglobin: 10.9 g/dL — ABNORMAL LOW (ref 12.0–15.0)
MCH: 29.1 pg (ref 26.0–34.0)
MCHC: 32.7 g/dL (ref 30.0–36.0)
MCV: 89 fL (ref 78.0–100.0)
Platelets: 278 10*3/uL (ref 150–400)
RBC: 3.74 MIL/uL — ABNORMAL LOW (ref 3.87–5.11)
RDW: 16.5 % — ABNORMAL HIGH (ref 11.5–15.5)
WBC: 9.3 10*3/uL (ref 4.0–10.5)

## 2014-09-19 LAB — TROPONIN I: Troponin I: <0.03 ng/mL (ref <0.031)

## 2014-09-19 LAB — I-STAT TROPONIN, ED: Troponin i, poc: 0 ng/mL (ref 0.00–0.08)

## 2014-09-19 MED ORDER — DICYCLOMINE HCL 20 MG PO TABS: 20.0000 mg | ORAL_TABLET | Freq: Two times a day (BID) | ORAL | Status: DC

## 2014-09-19 MED ORDER — ZOLPIDEM TARTRATE 5 MG PO TABS: 5.0000 mg | ORAL_TABLET | Freq: Every evening | ORAL | Status: DC | PRN

## 2014-09-19 MED ORDER — SODIUM CHLORIDE 0.9 % IV SOLN
INTRAVENOUS | Status: DC
  Administered 2014-09-19: 21:00:00 via INTRAVENOUS

## 2014-09-19 MED ORDER — SODIUM CHLORIDE 0.9 % IJ SOLN
3.0000 mL | Freq: Two times a day (BID) | INTRAMUSCULAR | Status: DC
  Administered 2014-09-23: 3 mL via INTRAVENOUS

## 2014-09-19 MED ORDER — PANTOPRAZOLE SODIUM 40 MG PO TBEC
40.0000 mg | DELAYED_RELEASE_TABLET | Freq: Every day | ORAL | Status: DC
  Administered 2014-09-20 – 2014-09-24 (×5): 40 mg via ORAL
  Filled 2014-09-19 (×3): qty 1

## 2014-09-19 MED ORDER — PHENAZOPYRIDINE HCL 200 MG PO TABS: 200.0000 mg | ORAL_TABLET | Freq: Three times a day (TID) | ORAL | Status: DC | PRN

## 2014-09-19 MED ORDER — ACETAMINOPHEN 650 MG RE SUPP: 650.0000 mg | Freq: Four times a day (QID) | RECTAL | Status: DC | PRN

## 2014-09-19 MED ORDER — DOCUSATE SODIUM 100 MG PO CAPS: 100.0000 mg | ORAL_CAPSULE | Freq: Every day | ORAL | Status: DC | PRN

## 2014-09-19 MED ORDER — ALUM & MAG HYDROXIDE-SIMETH 200-200-20 MG/5ML PO SUSP: 30.0000 mL | Freq: Four times a day (QID) | ORAL | Status: DC | PRN

## 2014-09-19 MED ORDER — LORATADINE 10 MG PO TABS
10.0000 mg | ORAL_TABLET | Freq: Every day | ORAL | Status: DC
  Administered 2014-09-20 – 2014-09-23 (×4): 10 mg via ORAL
  Filled 2014-09-19 (×6): qty 1

## 2014-09-19 MED ORDER — HEPARIN BOLUS VIA INFUSION
5000.0000 [IU] | Freq: Once | INTRAVENOUS | Status: AC
  Administered 2014-09-19: 5000 [IU] via INTRAVENOUS
  Filled 2014-09-19: qty 5000

## 2014-09-19 MED ORDER — ONDANSETRON HCL 4 MG PO TABS: 4.0000 mg | ORAL_TABLET | Freq: Four times a day (QID) | ORAL | Status: DC | PRN

## 2014-09-19 MED ORDER — ENOXAPARIN SODIUM 100 MG/ML ~~LOC~~ SOLN
1.0000 mg/kg | Freq: Once | SUBCUTANEOUS | Status: DC
  Filled 2014-09-19: qty 1

## 2014-09-19 MED ORDER — ACETAMINOPHEN 325 MG PO TABS
650.0000 mg | ORAL_TABLET | Freq: Four times a day (QID) | ORAL | Status: DC | PRN
  Administered 2014-09-20 – 2014-09-22 (×3): 650 mg via ORAL
  Filled 2014-09-19 (×3): qty 2

## 2014-09-19 MED ORDER — CYCLOBENZAPRINE HCL 10 MG PO TABS
10.0000 mg | ORAL_TABLET | Freq: Three times a day (TID) | ORAL | Status: DC | PRN
  Administered 2014-09-21 – 2014-09-22 (×2): 10 mg via ORAL
  Filled 2014-09-19 (×2): qty 1

## 2014-09-19 MED ORDER — GABAPENTIN 600 MG PO TABS
600.0000 mg | ORAL_TABLET | Freq: Three times a day (TID) | ORAL | Status: DC
  Administered 2014-09-19 – 2014-09-24 (×14): 600 mg via ORAL
  Filled 2014-09-19 (×19): qty 1

## 2014-09-19 MED ORDER — IOHEXOL 350 MG/ML SOLN
80.0000 mL | Freq: Once | INTRAVENOUS | Status: AC | PRN
  Administered 2014-09-19: 80 mL via INTRAVENOUS

## 2014-09-19 MED ORDER — DILTIAZEM HCL ER COATED BEADS 120 MG PO CP24
120.0000 mg | ORAL_CAPSULE | Freq: Every day | ORAL | Status: DC
  Administered 2014-09-20 – 2014-09-23 (×4): 120 mg via ORAL
  Filled 2014-09-19 (×5): qty 1

## 2014-09-19 MED ORDER — HEPARIN (PORCINE) IN NACL 100-0.45 UNIT/ML-% IJ SOLN
1100.0000 [IU]/h | INTRAMUSCULAR | Status: DC
  Administered 2014-09-19: 1300 [IU]/h via INTRAVENOUS
  Administered 2014-09-20: 1100 [IU]/h via INTRAVENOUS
  Filled 2014-09-19 (×3): qty 250

## 2014-09-19 MED ORDER — TRAZODONE HCL 100 MG PO TABS
100.0000 mg | ORAL_TABLET | Freq: Every day | ORAL | Status: DC
  Administered 2014-09-19 – 2014-09-23 (×5): 100 mg via ORAL
  Filled 2014-09-19 (×6): qty 1

## 2014-09-19 MED ORDER — ONDANSETRON HCL 4 MG/2ML IJ SOLN
4.0000 mg | Freq: Four times a day (QID) | INTRAMUSCULAR | Status: DC | PRN
  Administered 2014-09-20: 4 mg via INTRAVENOUS
  Filled 2014-09-19: qty 2

## 2014-09-19 NOTE — ED Provider Notes (Signed)
CSN: [id number]     Arrival date & time 09/19/14  1455 History   First MD Initiated Contact with Patient 09/19/14 1610     Chief Complaint  Patient presents with  . Shortness of Breath     (Consider location/radiation/quality/duration/timing/severity/associated sxs/prior Treatment) The history is provided by the patient.  Valerie Vazquez is a 59 y.o. female hx of HTN, DVT and PE off anticoagulation here with shortness of breath, chest pain. Patient has been short of breath for the last 3 days. Shortness of breath worsened night when she lays down and worse with exertion. Also some pleuritic chest pain as well. He has worsening left leg swelling that's been going on for several months. She had a hip replacement last year and the swelling has intermittent get worse since then. Denies any fevers but has nonproductive cough. Has some sore throat that improved. Denies any history of MI or cardiac stents.    Past Medical History  Diagnosis Date  . Arthritis   . Spinal stenosis   . Spondylosis   . History of neck surgery 10/28/2013  . Hypertension   . DVT (deep venous thrombosis)    Past Surgical History  Procedure Laterality Date  . Tonsilectomy, adenoidectomy, bilateral myringotomy and tubes    . Abdominal hysterectomy    . Hernia repair    . Excision/release bursa hip    . Appendectomy    . Cholecystectomy    . Left hip replacement  04-06-2014   No family history on file. History  Substance Use Topics  . Smoking status: Former Smoker    Quit date: 04/12/1979  . Smokeless tobacco: Not on file  . Alcohol Use: No   OB History    No data available     Review of Systems  Respiratory: Positive for shortness of breath.   All other systems reviewed and are negative.     Allergies  Abilify; Augmentin; Bactrim; Ceftin; Diclofenac; Indocin; Lisinopril; Morphine and related; Penicillins; Simvastatin; Wellbutrin; and Quetiapine fumarate  Home Medications   Prior to  Admission medications   Medication Sig Start Date End Date Taking? Authorizing Provider  bisacodyl (DULCOLAX) 5 MG EC tablet Take 5 mg by mouth daily as needed for mild constipation or moderate constipation.    Historical Provider, MD  cephALEXin (KEFLEX) 500 MG capsule Take 1 capsule (500 mg total) by mouth 4 (four) times daily. Patient not taking: Reported on 08/02/2014 08/01/14   Margarita Mail, PA-C  cyclobenzaprine (FLEXERIL) 5 MG tablet Take 10 mg by mouth 3 (three) times daily as needed for muscle spasms.     Historical Provider, MD  dicyclomine (BENTYL) 10 MG capsule Take 20 mg by mouth 4 (four) times daily -  before meals and at bedtime.     Historical Provider, MD  dicyclomine (BENTYL) 20 MG tablet Take 1 tablet (20 mg total) by mouth 2 (two) times daily. Patient not taking: Reported on 08/01/2014 06/22/14   Tanna Furry, MD  diltiazem (CARDIZEM CD) 120 MG 24 hr capsule Take 120 mg by mouth daily.    Historical Provider, MD  docusate sodium (COLACE) 100 MG capsule Take 100 mg by mouth daily as needed for mild constipation.     Historical Provider, MD  gabapentin (NEURONTIN) 600 MG tablet Take 600 mg by mouth 3 (three) times daily.    Historical Provider, MD  HYDROcodone-acetaminophen (NORCO) 5-325 MG per tablet Take 1 tablet by mouth every 6 (six) hours as needed. 08/01/14   Margarita Mail, PA-C  hyoscyamine (LEVSIN/SL) 0.125 MG SL tablet Place 1 tablet (0.125 mg total) under the tongue every 4 (four) hours as needed. Patient taking differently: Place 0.125 mg under the tongue every 4 (four) hours as needed for cramping.  06/22/14   Tanna Furry, MD  loratadine (CLARITIN) 10 MG tablet Take 10 mg by mouth daily.    Historical Provider, MD  Menthol-Camphor (ICY HOT ARTHRITIS PAIN RELIEF) 16-4 % LOTN Apply 1 application topically daily as needed (for pain).    Historical Provider, MD  metoCLOPramide (REGLAN) 10 MG tablet Take 10 mg by mouth 4 (four) times daily -  before meals and at bedtime.   07/10/14   Historical Provider, MD  pantoprazole (PROTONIX) 40 MG tablet Take 40 mg by mouth daily.    Historical Provider, MD  phenazopyridine (PYRIDIUM) 200 MG tablet Take 1 tablet (200 mg total) by mouth 3 (three) times daily as needed (pain with urination). Patient not taking: Reported on 08/02/2014 08/01/14   Margarita Mail, PA-C  polyethylene glycol [facility] / GLYCOLAX) packet Take 17 g by mouth 2 (two) times daily. Twice daily until normal bowel movements, then once daily as needed for constipation. Patient not taking: Reported on 08/01/2014 07/19/14   Noland Fordyce, PA-C  traZODone (DESYREL) 100 MG tablet Take 100 mg by mouth at bedtime.     Historical Provider, MD   BP 129/67 mmHg  Pulse 89  Temp(Src) 98.2 F (36.8 C) (Oral)  Resp 22  Ht 5\' 6"  (1.676 m)  Wt 199 lb (90.266 kg)  BMI 32.13 kg/m2  SpO2 96% Physical Exam  Constitutional: She is oriented to person, place, and time. She appears well-developed and well-nourished.  HENT:  Head: Normocephalic.  Mouth/Throat: Oropharynx is clear and moist.  Eyes: Conjunctivae are normal. Pupils are equal, round, and reactive to light.  Neck: Normal range of motion. Neck supple.  Cardiovascular: Regular rhythm.   Slightly tachy   Pulmonary/Chest:  Slightly tachypneic, no wheezing   Abdominal: Soft. Bowel sounds are normal. She exhibits no distension. There is no tenderness. There is no rebound.  Musculoskeletal:  Trace edema L leg, no obvious calf tenderness.   Neurological: She is alert and oriented to person, place, and time. No cranial nerve deficit. Coordination normal.  Skin: Skin is warm and dry.  Psychiatric: She has a normal mood and affect. Her behavior is normal. Judgment and thought content normal.  Nursing note and vitals reviewed.   ED Course  Procedures (including critical care time)  CRITICAL CARE Performed by: Darl Householder, DAVID   Total critical care time: 30 min   Critical care time was exclusive of separately billable  procedures and treating other patients.  Critical care was necessary to treat or prevent imminent or life-threatening deterioration.  Critical care was time spent personally by me on the following activities: development of treatment plan with patient and/or surrogate as well as nursing, discussions with consultants, evaluation of patient's response to treatment, examination of patient, obtaining history from patient or surrogate, ordering and performing treatments and interventions, ordering and review of laboratory studies, ordering and review of radiographic studies, pulse oximetry and re-evaluation of patient's condition.   Labs Review Labs Reviewed  CBC - Abnormal; Notable for the following:    RBC 3.74 (*)    Hemoglobin 10.9 (*)    HCT 33.3 (*)    RDW 16.5 (*)    All other components within normal limits  BASIC METABOLIC PANEL - Abnormal; Notable for the following:    Glucose, Bld  143 (*)    GFR calc non Af Amer 70 (*)    GFR calc Af Amer 81 (*)    All other components within normal limits  BRAIN NATRIURETIC PEPTIDE  I-STAT TROPOININ, ED    Imaging Review Dg Chest 2 View  09/19/2014   CLINICAL DATA:  Shortness of Breath, chest heaviness for past 3 days.  EXAM: CHEST  2 VIEW  COMPARISON:  07/19/2014  FINDINGS: The heart size and mediastinal contours are within normal limits. Both lungs are clear. The visualized skeletal structures are unremarkable.  IMPRESSION: No active cardiopulmonary disease.   Electronically Signed   By: Rolm Baptise M.D.   On: 09/19/2014 16:12   Ct Angio Chest Pe W/cm &/or Wo Cm  09/19/2014   CLINICAL DATA:  Shortness of breath for 3 days, personal history of pulmonary embolism and deep venous thrombosis.  EXAM: CT ANGIOGRAPHY CHEST WITH CONTRAST  TECHNIQUE: Multidetector CT imaging of the chest was performed using the standard protocol during bolus administration of intravenous contrast. Multiplanar CT image reconstructions and MIPs were obtained to evaluate  the vascular anatomy.  CONTRAST:  4mL OMNIPAQUE IOHEXOL 350 MG/ML SOLN  COMPARISON:  06/22/2014  FINDINGS: The lungs are well aerated bilaterally. No focal infiltrate or sizable effusion is seen.  The thoracic aorta is unremarkable. Pulmonary artery demonstrates a normal branching pattern. Multiple filling defects are noted involving the left upper lobe, right lower lobe and right middle lobe pulmonary artery branches. No right heart strain is identified. No significant hilar or mediastinal adenopathy is seen.  The visualized upper abdomen is within normal limits. The osseous structures show no acute abnormality. Postsurgical changes in the cervical spine are noted.  Review of the MIP images confirms the above findings.  IMPRESSION: Bilateral small pulmonary emboli without evidence of right heart strain these are new from the prior exam.  No other focal abnormality is noted.   Electronically Signed   By: Inez Catalina M.D.   On: 09/19/2014 18:08     EKG Interpretation None      MDM   Final diagnoses:  Shortness of breath    Valerie Vazquez is a 59 y.o. female here with shortness of breath. Trop neg x 1 and symptoms for 3 days so i doubt ACS. BNP nl so I doubt CHF. Will get CT angio to r/o PE given that she has hx of PE.   6:46 PM CT showed bilateral PEs. No obvious R heart strain. Not tachy now and never hypoxic. Will anticoagulate and admit.     Wandra Arthurs, MD 09/19/14 [phone]

## 2014-09-19 NOTE — ED Notes (Signed)
Pt reports sob for several days that is constant. Also reports chest pressure. Has been feeling nauseated and weak.

## 2014-09-19 NOTE — Progress Notes (Signed)
ANTICOAGULATION CONSULT NOTE - Initial Consult  Pharmacy Consult for Heparin Indication: pulmonary embolus  Allergies  Allergen Reactions  . Abilify [Aripiprazole] Other (See Comments)    Motor skills   . Augmentin [Amoxicillin-Pot Clavulanate] Nausea And Vomiting and Diarrhea  . Bactrim [Sulfamethoxazole-Trimethoprim] Diarrhea  . Ceftin [Cefuroxime Axetil] Diarrhea  . Diclofenac     Other reaction(s): Muscle Pain  . Indocin [Indomethacin] Other (See Comments)    Migraines   . Lisinopril Diarrhea and Other (See Comments)  . Morphine And Related Other (See Comments)    Chest tightness   . Penicillins Itching  . Simvastatin Other (See Comments)  . Wellbutrin [Bupropion] Other (See Comments)    Slurred speech  . Quetiapine Fumarate Palpitations    Patient Measurements: Height: 5\' 6"  (167.6 cm) Weight: 199 lb (90.266 kg) IBW/kg (Calculated) : 59.3 Heparin Dosing Weight: 79.5 kg  Vital Signs: Temp: 98.2 F (36.8 C) (01/19 2032) Temp Source: Oral (01/19 2032) BP: 147/78 mmHg (01/19 2032) Pulse Rate: 83 (01/19 2032)  Labs:  Recent Labs  09/19/14 1511  HGB 10.9*  HCT 33.3*  PLT 278  CREATININE 0.88    Estimated Creatinine Clearance: 77 mL/min (by C-G formula based on Cr of 0.88).   Medical History: Past Medical History  Diagnosis Date  . Arthritis   . Spinal stenosis   . Spondylosis   . History of neck surgery 10/28/2013  . Hypertension   . DVT (deep venous thrombosis)    Assessment:  58 yr old woman with new small bilateral PE per chest CT today.  Hx DVT + PE in 2002 and 2008. Previously on Coumadin, but off since ~2014. She reports difficulty with regulating Coumadin in the past, with supratherapeutic INRs but no bleeding. IVC filter in July 2015; she also reports that she feels the IVC filter may have shifted position while she was in rehab s/p hip surgery in August.     To begin IV heparin.  Goal of Therapy:  Heparin level 0.3-0.7 units/ml Monitor  platelets by anticoagulation protocol: Yes   Plan:    Heparin 5000 units IV bolus.   Heparin drip to run at 1300 units/hr.   Heparin level ~ 6 hrs after drip begins.   Daily heparin level and CBC while on heparin.  Arty Baumgartner, [place] Pager: [phone] 09/19/2014,9:31 PM

## 2014-09-19 NOTE — ED Notes (Signed)
Pt. Transported to vascular.   

## 2014-09-19 NOTE — Progress Notes (Signed)
VASCULAR LAB PRELIMINARY  PRELIMINARY  PRELIMINARY  PRELIMINARY  Left lower extremity venous duplex completed.    Preliminary report:  Left:  No evidence of DVT, superficial thrombosis, or Baker's cyst.  Valerie Vazquez, RVS 09/19/2014, 6:38 PM
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
## 2014-09-20 ENCOUNTER — Inpatient Hospital Stay (HOSPITAL_COMMUNITY): Payer: Medicare Other

## 2014-09-20 LAB — CBC
HCT: 32.5 % — ABNORMAL LOW (ref 36.0–46.0)
Hemoglobin: 10.6 g/dL — ABNORMAL LOW (ref 12.0–15.0)
MCH: 28.9 pg (ref 26.0–34.0)
MCHC: 32.6 g/dL (ref 30.0–36.0)
MCV: 88.6 fL (ref 78.0–100.0)
Platelets: 274 10*3/uL (ref 150–400)
RBC: 3.67 MIL/uL — ABNORMAL LOW (ref 3.87–5.11)
RDW: 16.6 % — ABNORMAL HIGH (ref 11.5–15.5)
WBC: 7.5 10*3/uL (ref 4.0–10.5)

## 2014-09-20 LAB — HEMOGLOBIN A1C
Hgb A1c MFr Bld: 6.5 % — ABNORMAL HIGH (ref <5.7)
Mean Plasma Glucose: 140 mg/dL — ABNORMAL HIGH (ref <117)

## 2014-09-20 LAB — COMPREHENSIVE METABOLIC PANEL
ALT: 20 U/L (ref 0–35)
AST: 22 U/L (ref 0–37)
Albumin: 3.3 g/dL — ABNORMAL LOW (ref 3.5–5.2)
Alkaline Phosphatase: 90 U/L (ref 39–117)
Anion gap: 10 (ref 5–15)
BUN: 10 mg/dL (ref 6–23)
CO2: 26 mmol/L (ref 19–32)
Calcium: 9 mg/dL (ref 8.4–10.5)
Chloride: 105 mEq/L (ref 96–112)
Creatinine, Ser: 0.75 mg/dL (ref 0.50–1.10)
GFR calc Af Amer: >90 mL/min (ref >90)
GFR calc non Af Amer: >90 mL/min (ref >90)
Glucose, Bld: 128 mg/dL — ABNORMAL HIGH (ref 70–99)
Potassium: 3.9 mmol/L (ref 3.5–5.1)
Sodium: 141 mmol/L (ref 135–145)
Total Bilirubin: 0.6 mg/dL (ref 0.3–1.2)
Total Protein: 6.3 g/dL (ref 6.0–8.3)

## 2014-09-20 LAB — GLUCOSE, CAPILLARY
Glucose-Capillary: 103 mg/dL — ABNORMAL HIGH (ref 70–99)
Glucose-Capillary: 142 mg/dL — ABNORMAL HIGH (ref 70–99)

## 2014-09-20 LAB — HEPARIN LEVEL (UNFRACTIONATED)
Heparin Unfractionated: 0.98 IU/mL — ABNORMAL HIGH (ref 0.30–0.70)
Heparin Unfractionated: 0.96 IU/mL — ABNORMAL HIGH (ref 0.30–0.70)
Heparin Unfractionated: 0.37 IU/mL (ref 0.30–0.70)

## 2014-09-20 LAB — MRSA PCR SCREENING: MRSA by PCR: NEGATIVE

## 2014-09-20 LAB — PROTIME-INR
INR: 1.05 (ref 0.00–1.49)
Prothrombin Time: 13.8 seconds (ref 11.6–15.2)

## 2014-09-20 LAB — TROPONIN I
Troponin I: <0.03 ng/mL (ref <0.031)
Troponin I: <0.03 ng/mL (ref <0.031)

## 2014-09-20 MED ORDER — INSULIN ASPART 100 UNIT/ML ~~LOC~~ SOLN
0.0000 [IU] | Freq: Three times a day (TID) | SUBCUTANEOUS | Status: DC
  Administered 2014-09-21 – 2014-09-23 (×5): 1 [IU] via SUBCUTANEOUS

## 2014-09-20 MED ORDER — WARFARIN SODIUM 5 MG PO TABS
5.0000 mg | ORAL_TABLET | Freq: Once | ORAL | Status: AC
  Administered 2014-09-20: 5 mg via ORAL
  Filled 2014-09-20: qty 1

## 2014-09-20 MED ORDER — HEPARIN (PORCINE) IN NACL 100-0.45 UNIT/ML-% IJ SOLN
1050.0000 [IU]/h | INTRAMUSCULAR | Status: DC
  Administered 2014-09-20: 800 [IU]/h via INTRAVENOUS
  Administered 2014-09-22 – 2014-09-24 (×2): 900 [IU]/h via INTRAVENOUS
  Filled 2014-09-20 (×4): qty 250

## 2014-09-20 MED ORDER — WARFARIN - PHARMACIST DOSING INPATIENT: Freq: Every day | Status: DC

## 2014-09-20 MED ORDER — FLUTICASONE PROPIONATE 50 MCG/ACT NA SUSP
2.0000 | Freq: Every day | NASAL | Status: DC
  Administered 2014-09-20 – 2014-09-24 (×5): 2 via NASAL
  Filled 2014-09-20: qty 16

## 2014-09-20 MED ORDER — HYDROCODONE-ACETAMINOPHEN 5-325 MG PO TABS
1.0000 | ORAL_TABLET | Freq: Four times a day (QID) | ORAL | Status: DC
  Administered 2014-09-20 – 2014-09-24 (×11): 1 via ORAL
  Filled 2014-09-20 (×12): qty 1

## 2014-09-20 MED ORDER — GUAIFENESIN-DM 100-10 MG/5ML PO SYRP
5.0000 mL | ORAL_SOLUTION | ORAL | Status: DC | PRN
  Administered 2014-09-20 (×3): 5 mL via ORAL
  Filled 2014-09-20 (×3): qty 5

## 2014-09-20 NOTE — Progress Notes (Addendum)
Inpatient Diabetes Program Recommendations  AACE/ADA: New Consensus Statement on Inpatient Glycemic Control (2013)  Target Ranges:  Prepandial:   less than 140 mg/dL      Peak postprandial:   less than 180 mg/dL (1-2 hours)      Critically ill patients:  140 - 180 mg/dL   Reason for Visit: SOB, CP  Diabetes history: No prior history   Inpatient Diabetes Program Recommendations HgbA1C: Noted pts A1c is 6.5% 09/19/14. Also noted Hgb down to 10.6, which may skew A1c results. Pt has no prior diagnosis of DM. Per ADA this meets the criteria for the diagnosis of Diabetes. If the pt is to be diagnosed, please inform patient, nursing staff, and consult the Diabetes Coordinator.  Thanks,  Tama Headings RN, MSN, [facility] Inpatient Diabetes Coordinator Team Pager [phone]

## 2014-09-20 NOTE — Progress Notes (Signed)
ANTICOAGULATION CONSULT NOTE - Follow Up Consult  Pharmacy Consult for heparin Indication: pulmonary embolus    Labs:  Recent Labs  09/19/14 1511 09/19/14 2201 09/20/14 0327  HGB 10.9*  --   --   HCT 33.3*  --   --   PLT 278  --   --   HEPARINUNFRC  --   --  0.98*  CREATININE 0.88  --   --   TROPONINI  --  <0.03 <0.03     Assessment: 58yo female supratherapeutic on heparin with initial dosing for PE.  Goal of Therapy:  Heparin level 0.3-0.7 units/ml   Plan:  Will decrease heparin gtt by 2 units/kg/hr to 1100 units/hr and check level in 6hr.  Wynona Neat, PharmD, BCPS  09/20/2014,4:56 AM

## 2014-09-20 NOTE — Progress Notes (Signed)
[facility] PROGRESS NOTE  Lawanna Cecere QQP:[id number] DOB: 12/13/1955 DOA: 09/19/2014 PCP: [name], MD Interim summary: Valerie Vazquez is a 59 y.o. female presents with shortness of breath and Chest Pain. 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 She stopped coumadin because she could n't pay for the follow up co pays. Shew as found to have bilateral small PE. She also reports having a IVC filter put in last year July at [facility]. She was started on IV heparin and awaiting insurance details to see if she can get NOAC.   Assessment/Plan: 1. Bilateral pulmonary embolism: small without right heart strain.  LE dopplers negative. On IV heparin.   2. Shortness of breath and cough:  From the above.    3. New diagnosis of Diabetes Mellitus.  Diabetes education/ co ordinator consult.  Will start her metformin on discharge.  SSI.   4. Hypertension:  Controlled.   Right hip pain: Pain control, PT and x ray of the right hip.   Code Status: full code.  Family Communication: family atbedside Disposition Plan: pending.    Consultants:  NONE  Procedures:  CTA OF THE CHEST  Antibiotics:  none  HPI/Subjective: Sinus congestion.   Objective: Filed Vitals:   09/20/14 1300  BP: 124/66  Pulse: 100  Temp: 98.7 F (37.1 C)  Resp: 20    Intake/Output Summary (Last 24 hours) at 09/20/14 1520 Last data filed at 09/20/14 1100  Gross per 24 hour  Intake    240 ml  Output      0 ml  Net    240 ml   Filed Weights   09/19/14 1505 09/19/14 2032  Weight: 90.266 kg (199 lb) 90.538 kg (199 lb 9.6 oz)    Exam:   General:  Alert afebrile comfortable  Cardiovascular: s1s2  Respiratory: ctab  Abdomen: soft non tender non distended bowel sounds heard.   Musculoskeletal: no pedal edema.   Data Reviewed: Basic Metabolic Panel:  Recent Labs Lab 09/19/14 1511 09/20/14 0911  NA 141 141  K 4.3 3.9  CL 105  105  CO2 28 26  GLUCOSE 143* 128*  BUN 14 10  CREATININE 0.88 0.75  CALCIUM 9.3 9.0   Liver Function Tests:  Recent Labs Lab 09/20/14 0911  AST 22  ALT 20  ALKPHOS 90  BILITOT 0.6  PROT 6.3  ALBUMIN 3.3*   No results for input(s): LIPASE, AMYLASE in the last 168 hours. No results for input(s): AMMONIA in the last 168 hours. CBC:  Recent Labs Lab 09/19/14 1511 09/20/14 0911  WBC 9.3 7.5  HGB 10.9* 10.6*  HCT 33.3* 32.5*  MCV 89.0 88.6  PLT 278 274   Cardiac Enzymes:  Recent Labs Lab 09/19/14 2201 09/20/14 0327 09/20/14 0911  TROPONINI <0.03 <0.03 <0.03   BNP (last 3 results) No results for input(s): PROBNP in the last 8760 hours. CBG:  Recent Labs Lab 09/20/14 0606  GLUCAP 103*    Recent Results (from the past 240 hour(s))  MRSA PCR Screening     Status: None   Collection Time: 09/19/14 11:50 PM  Result Value Ref Range Status   MRSA by PCR NEGATIVE NEGATIVE Final    Comment:        The GeneXpert MRSA Assay (FDA approved for NASAL specimens only), is one component of a comprehensive MRSA colonization surveillance program. It is not intended to diagnose MRSA infection nor to guide or monitor treatment for MRSA  infections.      Studies: Dg Chest 2 View  09/19/2014   CLINICAL DATA:  Shortness of Breath, chest heaviness for past 3 days.  EXAM: CHEST  2 VIEW  COMPARISON:  07/19/2014  FINDINGS: The heart size and mediastinal contours are within normal limits. Both lungs are clear. The visualized skeletal structures are unremarkable.  IMPRESSION: No active cardiopulmonary disease.   Electronically Signed   By: Rolm Baptise M.D.   On: 09/19/2014 16:12   Ct Angio Chest Pe W/cm &/or Wo Cm  09/19/2014   CLINICAL DATA:  Shortness of breath for 3 days, personal history of pulmonary embolism and deep venous thrombosis.  EXAM: CT ANGIOGRAPHY CHEST WITH CONTRAST  TECHNIQUE: Multidetector CT imaging of the chest was performed using the standard protocol during  bolus administration of intravenous contrast. Multiplanar CT image reconstructions and MIPs were obtained to evaluate the vascular anatomy.  CONTRAST:  14mL OMNIPAQUE IOHEXOL 350 MG/ML SOLN  COMPARISON:  06/22/2014  FINDINGS: The lungs are well aerated bilaterally. No focal infiltrate or sizable effusion is seen.  The thoracic aorta is unremarkable. Pulmonary artery demonstrates a normal branching pattern. Multiple filling defects are noted involving the left upper lobe, right lower lobe and right middle lobe pulmonary artery branches. No right heart strain is identified. No significant hilar or mediastinal adenopathy is seen.  The visualized upper abdomen is within normal limits. The osseous structures show no acute abnormality. Postsurgical changes in the cervical spine are noted.  Review of the MIP images confirms the above findings.  IMPRESSION: Bilateral small pulmonary emboli without evidence of right heart strain these are new from the prior exam.  No other focal abnormality is noted.   Electronically Signed   By: Inez Catalina M.D.   On: 09/19/2014 18:08    Scheduled Meds: . diltiazem  120 mg Oral Daily  . fluticasone  2 spray Each Nare Daily  . gabapentin  600 mg Oral TID  . HYDROcodone-acetaminophen  1 tablet Oral 4 times per day  . loratadine  10 mg Oral Daily  . pantoprazole  40 mg Oral Daily  . sodium chloride  3 mL Intravenous Q12H  . traZODone  100 mg Oral QHS  . warfarin  5 mg Oral ONCE-1800  . Warfarin - Pharmacist Dosing Inpatient   Does not apply q1800   Continuous Infusions: . sodium chloride 50 mL/hr at 09/19/14 2129  . heparin 800 Units/hr (09/20/14 1252)    Active Problems:   Essential hypertension, benign   GERD (gastroesophageal reflux disease)   Pulmonary embolism    Time spent: 25 min    Legend P[place]ore  [facility] Pager [phone] If 7PM-7AM, please contact night-coverage at [email or web address], password [facility] 09/20/2014, 3:20 PM  LOS: 1 day
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
## 2014-09-20 NOTE — Progress Notes (Signed)
Utilization review completed. Lillias Difrancesco, RN, BSN. 

## 2014-09-20 NOTE — Progress Notes (Signed)
  ANTICOAGULATION CONSULT NOTE - Follow Up Consult  Pharmacy Consult for heparin>>warfarin Indication: pulmonary embolus  Allergies  Allergen Reactions  . Abilify [Aripiprazole] Other (See Comments)    Motor skills   . Augmentin [Amoxicillin-Pot Clavulanate] Nausea And Vomiting and Diarrhea  . Bactrim [Sulfamethoxazole-Trimethoprim] Diarrhea  . Ceftin [Cefuroxime Axetil] Diarrhea  . Diclofenac     Other reaction(s): Muscle Pain  . Indocin [Indomethacin] Other (See Comments)    Migraines   . Lisinopril Diarrhea and Other (See Comments)  . Morphine And Related Other (See Comments)    Chest tightness   . Penicillins Itching  . Simvastatin Other (See Comments)  . Wellbutrin [Bupropion] Other (See Comments)    Slurred speech  . Quetiapine Fumarate Palpitations    Patient Measurements: Height: 5\' 6"  (167.6 cm) Weight: 199 lb 9.6 oz (90.538 kg) IBW/kg (Calculated) : 59.3 Heparin Dosing Weight: 80kg  Vital Signs: Temp: 98.1 F (36.7 C) (01/20 0511) Temp Source: Oral (01/20 0511) BP: 131/78 mmHg (01/20 0511) Pulse Rate: 89 (01/20 0511)  Labs:  Recent Labs  09/19/14 1511 09/19/14 2201 09/20/14 0327 09/20/14 0911 09/20/14 1046  HGB 10.9*  --   --  10.6*  --   HCT 33.3*  --   --  32.5*  --   PLT 278  --   --  274  --   HEPARINUNFRC  --   --  0.98*  --  0.96*  CREATININE 0.88  --   --  0.75  --   TROPONINI  --  <0.03 <0.03 <0.03  --     Estimated Creatinine Clearance: 84.8 mL/min (by C-G formula based on Cr of 0.75).   Assessment: 59 year old female admitted to hospital for bilateral PE. Patient has history of PE/DVT, has been on warfarin in past and had a very difficult time managing her INRs and eventually took herself of it.   Patient started on heparin last night, orders received to start warfarin tonight. Case mgmt checking insurance for possible DOAC coverage. HL still elevated this afternoon at 0.96, no bleeding noted, cbc stable. Will hold infusion 16min then  decrease rate. Will check INR with evening HL.   Day #1/5 VTE overlap with heparin>>coumadin.  Long discussion regarding warfarin and DOACs, she voiced frustration with her previous warfarin use regarding the monitoring and being out of range a large portion of the time. We discussed newer agents and press they are receiving in the news and how all blood thinners carry some risk. She was somewhat concerned about the lack of a reversal agent with newer agents and I explained new reversal agents are making their way to the market and what measures we currently take. She mentioned wanting to be compliant with her medications especially now with recurrent blood clots but feels that once a day xarelto would help with compliance.   We await insurance information on newer DOACs, I will revisit with her once this information is known. She had no other questions.   She would like to be connected with a heme/onc physician since "I keep having these blood clots" and previous testing in [place] was inconclusive.   Goal of Therapy:  INR 2-3 Heparin level 0.3-0.7 units/ml Monitor platelets by anticoagulation protocol: Yes   Plan:  Decrease heparin to 800 units/hr Check HL in 6 hours with an INR Warfarin 5mg  tonight Follow up insurance coverage for [place] PharmD., BCPS Clinical Pharmacist Pager [phone] 09/20/2014 1:46 PM

## 2014-09-20 NOTE — Progress Notes (Signed)
  ANTICOAGULATION CONSULT NOTE - Follow Up Consult  Pharmacy Consult for heparin Indication: pulmonary embolus  Allergies  Allergen Reactions  . Abilify [Aripiprazole] Other (See Comments)    Motor skills   . Augmentin [Amoxicillin-Pot Clavulanate] Nausea And Vomiting and Diarrhea  . Bactrim [Sulfamethoxazole-Trimethoprim] Diarrhea  . Ceftin [Cefuroxime Axetil] Diarrhea  . Diclofenac     Other reaction(s): Muscle Pain  . Indocin [Indomethacin] Other (See Comments)    Migraines   . Lisinopril Diarrhea and Other (See Comments)  . Morphine And Related Other (See Comments)    Chest tightness   . Penicillins Itching  . Simvastatin Other (See Comments)  . Wellbutrin [Bupropion] Other (See Comments)    Slurred speech  . Quetiapine Fumarate Palpitations    Patient Measurements: Height: 5\' 6"  (167.6 cm) Weight: 199 lb 9.6 oz (90.538 kg) IBW/kg (Calculated) : 59.3 Heparin Dosing Weight: 80kg  Vital Signs: Temp: 98.4 F (36.9 C) (01/20 2002) Temp Source: Oral (01/20 2002) BP: 136/71 mmHg (01/20 2002) Pulse Rate: 91 (01/20 2002)  Labs:  Recent Labs  09/19/14 1511 09/19/14 2201 09/20/14 0327 09/20/14 0911 09/20/14 1046 09/20/14 2113  HGB 10.9*  --   --  10.6*  --   --   HCT 33.3*  --   --  32.5*  --   --   PLT 278  --   --  274  --   --   LABPROT  --   --   --   --   --  13.8  INR  --   --   --   --   --  1.05  HEPARINUNFRC  --   --  0.98*  --  0.96* 0.37  CREATININE 0.88  --   --  0.75  --   --   TROPONINI  --  <0.03 <0.03 <0.03  --   --     Estimated Creatinine Clearance: 84.8 mL/min (by C-G formula based on Cr of 0.75).   Assessment: 59 year old female admitted to hospital for bilateral PE. Her heparin level this morning was elevated at 0.96 units/mL and her rate was decreased to 800 units/hr. She is now therapeutic at 0.37 units/mL. No bleeding noted.  The INR also drawn this evening was low/normal at 1.03.  Goal of Therapy:  Heparin level 0.3-0.7  units/ml Monitor platelets by anticoagulation protocol: Yes   Plan:  -continue heparin at 800 units/hr -daily heparin level and CBC- next heparin level tomorrow morning -see Erin Hearing, RPh's note from this morning for more detail regarding long term anticoagulation plans  Ripken Rekowski D. Paz Fuentes, PharmD, BCPS Clinical Pharmacist Pager: [phone] 09/20/2014 10:00 PM

## 2014-09-21 ENCOUNTER — Inpatient Hospital Stay (HOSPITAL_COMMUNITY): Payer: Medicare Other

## 2014-09-21 LAB — CBC
HCT: 32 % — ABNORMAL LOW (ref 36.0–46.0)
Hemoglobin: 10.5 g/dL — ABNORMAL LOW (ref 12.0–15.0)
MCH: 29.9 pg (ref 26.0–34.0)
MCHC: 32.8 g/dL (ref 30.0–36.0)
MCV: 91.2 fL (ref 78.0–100.0)
Platelets: 253 10*3/uL (ref 150–400)
RBC: 3.51 MIL/uL — ABNORMAL LOW (ref 3.87–5.11)
RDW: 16.8 % — ABNORMAL HIGH (ref 11.5–15.5)
WBC: 6.4 10*3/uL (ref 4.0–10.5)

## 2014-09-21 LAB — GLUCOSE, CAPILLARY
Glucose-Capillary: 126 mg/dL — ABNORMAL HIGH (ref 70–99)
Glucose-Capillary: 91 mg/dL (ref 70–99)
Glucose-Capillary: 123 mg/dL — ABNORMAL HIGH (ref 70–99)
Glucose-Capillary: 111 mg/dL — ABNORMAL HIGH (ref 70–99)

## 2014-09-21 LAB — HEPARIN LEVEL (UNFRACTIONATED)
Heparin Unfractionated: 0.26 IU/mL — ABNORMAL LOW (ref 0.30–0.70)
Heparin Unfractionated: 0.34 IU/mL (ref 0.30–0.70)

## 2014-09-21 LAB — PROTIME-INR
INR: 0.98 (ref 0.00–1.49)
Prothrombin Time: 13.1 seconds (ref 11.6–15.2)

## 2014-09-21 MED ORDER — POLYETHYLENE GLYCOL 3350 17 G PO PACK
17.0000 g | PACK | Freq: Every day | ORAL | Status: DC
  Administered 2014-09-22: 17 g via ORAL
  Filled 2014-09-21 (×4): qty 1

## 2014-09-21 MED ORDER — LEVOFLOXACIN 250 MG PO TABS
250.0000 mg | ORAL_TABLET | Freq: Every day | ORAL | Status: DC
  Administered 2014-09-21 – 2014-09-24 (×4): 250 mg via ORAL
  Filled 2014-09-21 (×4): qty 1

## 2014-09-21 MED ORDER — SENNOSIDES-DOCUSATE SODIUM 8.6-50 MG PO TABS
2.0000 | ORAL_TABLET | Freq: Two times a day (BID) | ORAL | Status: DC
  Administered 2014-09-21 – 2014-09-22 (×2): 2 via ORAL
  Filled 2014-09-21 (×7): qty 2

## 2014-09-21 MED ORDER — BENZONATATE 100 MG PO CAPS
100.0000 mg | ORAL_CAPSULE | Freq: Three times a day (TID) | ORAL | Status: DC | PRN
  Filled 2014-09-21: qty 1

## 2014-09-21 MED ORDER — WARFARIN SODIUM 5 MG PO TABS
5.0000 mg | ORAL_TABLET | Freq: Once | ORAL | Status: AC
  Administered 2014-09-21: 5 mg via ORAL
  Filled 2014-09-21: qty 1

## 2014-09-21 NOTE — Progress Notes (Signed)
[facility] PROGRESS NOTE  Valerie Vazquez ACZ:[id number] DOB: Jan 19, 1956 DOA: 09/19/2014 PCP: [name], MD Interim summary: Valerie Vazquez is a 59 y.o. female presents with shortness of breath and Chest Pain. 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 She stopped coumadin because she could n't pay for the follow up co pays. She was found to have bilateral small PE. She also reports having a IVC filter put in last year July at [facility]. She was started on IV heparin and awaiting insurance details to see if she can get NOAC.   Assessment/Plan: 1. Bilateral pulmonary embolism: small without right heart strain.  LE dopplers negative. On IV heparin.   2. Shortness of breath and cough:  From the above.    3. New diagnosis of Diabetes Mellitus.  Diabetes education/ co ordinator consult.  Will start her metformin on discharge.  SSI.   4. Hypertension:  Controlled.   Right hip pain: Pain control, PT and x ray of the right hip showed severe osteoarthritis.  CT hip ordered and will probably call orthopedics for further evaluation.    Sinusitis and bronchitis: Started her on decongestants and po levaquin.     Code Status: full code.  Family Communication: family atbedside Disposition Plan: pending.    Consultants:  NONE  Procedures:  CTA OF THE CHEST  Antibiotics:  none  HPI/Subjective: Sinus congestion. Headache.   Objective: Filed Vitals:   09/21/14 1043  BP: 112/55  Pulse:   Temp:   Resp:     Intake/Output Summary (Last 24 hours) at 09/21/14 1111 Last data filed at 09/21/14 0929  Gross per 24 hour  Intake    480 ml  Output      0 ml  Net    480 ml   Filed Weights   09/19/14 1505 09/19/14 2032  Weight: 90.266 kg (199 lb) 90.538 kg (199 lb 9.6 oz)    Exam:   General:  Alert afebrile comfortable  Cardiovascular: s1s2  Respiratory: ctab  Abdomen: soft non tender non distended bowel  sounds heard.   Musculoskeletal: no pedal edema.   Data Reviewed: Basic Metabolic Panel:  Recent Labs Lab 09/19/14 1511 09/20/14 0911  NA 141 141  K 4.3 3.9  CL 105 105  CO2 28 26  GLUCOSE 143* 128*  BUN 14 10  CREATININE 0.88 0.75  CALCIUM 9.3 9.0   Liver Function Tests:  Recent Labs Lab 09/20/14 0911  AST 22  ALT 20  ALKPHOS 90  BILITOT 0.6  PROT 6.3  ALBUMIN 3.3*   No results for input(s): LIPASE, AMYLASE in the last 168 hours. No results for input(s): AMMONIA in the last 168 hours. CBC:  Recent Labs Lab 09/19/14 1511 09/20/14 0911 09/21/14 0508  WBC 9.3 7.5 6.4  HGB 10.9* 10.6* 10.5*  HCT 33.3* 32.5* 32.0*  MCV 89.0 88.6 91.2  PLT 278 274 253   Cardiac Enzymes:  Recent Labs Lab 09/19/14 2201 09/20/14 0327 09/20/14 0911  TROPONINI <0.03 <0.03 <0.03   BNP (last 3 results) No results for input(s): PROBNP in the last 8760 hours. CBG:  Recent Labs Lab 09/20/14 0606 09/20/14 2210 09/21/14 0606  GLUCAP 103* 142* 126*    Recent Results (from the past 240 hour(s))  MRSA PCR Screening     Status: None   Collection Time: 09/19/14 11:50 PM  Result Value Ref Range Status   MRSA by PCR NEGATIVE NEGATIVE Final    Comment:  The GeneXpert MRSA Assay (FDA approved for NASAL specimens only), is one component of a comprehensive MRSA colonization surveillance program. It is not intended to diagnose MRSA infection nor to guide or monitor treatment for MRSA infections.      Studies: Dg Chest 2 View  09/19/2014   CLINICAL DATA:  Shortness of Breath, chest heaviness for past 3 days.  EXAM: CHEST  2 VIEW  COMPARISON:  07/19/2014  FINDINGS: The heart size and mediastinal contours are within normal limits. Both lungs are clear. The visualized skeletal structures are unremarkable.  IMPRESSION: No active cardiopulmonary disease.   Electronically Signed   By: Rolm Baptise M.D.   On: 09/19/2014 16:12   Ct Angio Chest Pe W/cm &/or Wo Cm  09/19/2014    CLINICAL DATA:  Shortness of breath for 3 days, personal history of pulmonary embolism and deep venous thrombosis.  EXAM: CT ANGIOGRAPHY CHEST WITH CONTRAST  TECHNIQUE: Multidetector CT imaging of the chest was performed using the standard protocol during bolus administration of intravenous contrast. Multiplanar CT image reconstructions and MIPs were obtained to evaluate the vascular anatomy.  CONTRAST:  22mL OMNIPAQUE IOHEXOL 350 MG/ML SOLN  COMPARISON:  06/22/2014  FINDINGS: The lungs are well aerated bilaterally. No focal infiltrate or sizable effusion is seen.  The thoracic aorta is unremarkable. Pulmonary artery demonstrates a normal branching pattern. Multiple filling defects are noted involving the left upper lobe, right lower lobe and right middle lobe pulmonary artery branches. No right heart strain is identified. No significant hilar or mediastinal adenopathy is seen.  The visualized upper abdomen is within normal limits. The osseous structures show no acute abnormality. Postsurgical changes in the cervical spine are noted.  Review of the MIP images confirms the above findings.  IMPRESSION: Bilateral small pulmonary emboli without evidence of right heart strain these are new from the prior exam.  No other focal abnormality is noted.   Electronically Signed   By: Inez Catalina M.D.   On: 09/19/2014 18:08   Dg Hip Unilat With Pelvis 2-3 Views Right  09/20/2014   CLINICAL DATA:  59 year old female with a history of right hip pain for 1 month. No known injury.  EXAM: DG HIP W/ PELVIS 2-3V*R*  COMPARISON:  CT dated 06/22/2014  FINDINGS: Bony pelvic ring appears intact with no acute bony abnormality.  Right hip appears congruent into views, with degenerative changes of the right hip. Heterotopic ossification superior to the greater trochanter.  Surgical changes of left total hip arthroplasty or incompletely imaged.  Moderate stool burden.  No acute fracture line identified.  IMPRESSION: No acute bony  abnormality identified.  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  Incompletely visualized left hip total arthroplasty.  Signed,  Dulcy Fanny. Earleen Newport, DO  [facility]  [facility]   Electronically Signed   By: Corrie Mckusick D.O.   On: 09/20/2014 18:33    Scheduled Meds: . diltiazem  120 mg Oral Daily  . fluticasone  2 spray Each Nare Daily  . gabapentin  600 mg Oral TID  . HYDROcodone-acetaminophen  1 tablet Oral 4 times per day  . insulin aspart  0-9 Units Subcutaneous TID WC  . loratadine  10 mg Oral Daily  . pantoprazole  40 mg Oral Daily  . sodium chloride  3 mL Intravenous Q12H  . traZODone  100 mg Oral QHS  . warfarin  5 mg Oral ONCE-1800  . Warfarin - Pharmacist Dosing Inpatient   Does not apply [phone]  Continuous Infusions: . sodium chloride 50 mL/hr at 09/19/14 2129  . heparin 900 Units/hr (09/21/14 0617)    Active Problems:   Essential hypertension, benign   GERD (gastroesophageal reflux disease)   Pulmonary embolism    Time spent: 25 min    Gotti Alwin  [facility] Pager [phone] If 7PM-7AM, please contact night-coverage at [email or web address], password [facility] 09/21/2014, 11:11 AM  LOS: 2 days

## 2014-09-21 NOTE — Clinical Social Work Note (Signed)
Provided patients fiance with clothing  CSW will continue to follow.  Domenica Reamer, [place] Social Worker [phone]

## 2014-09-21 NOTE — Progress Notes (Addendum)
  ANTICOAGULATION CONSULT NOTE - Follow Up Consult  Pharmacy Consult for heparin>>warfarin Indication: pulmonary embolus  Allergies  Allergen Reactions  . Abilify [Aripiprazole] Other (See Comments)    Motor skills   . Augmentin [Amoxicillin-Pot Clavulanate] Nausea And Vomiting and Diarrhea  . Bactrim [Sulfamethoxazole-Trimethoprim] Diarrhea  . Ceftin [Cefuroxime Axetil] Diarrhea  . Diclofenac     Other reaction(s): Muscle Pain  . Indocin [Indomethacin] Other (See Comments)    Migraines   . Lisinopril Diarrhea and Other (See Comments)  . Morphine And Related Other (See Comments)    Chest tightness   . Penicillins Itching  . Simvastatin Other (See Comments)  . Wellbutrin [Bupropion] Other (See Comments)    Slurred speech  . Quetiapine Fumarate Palpitations    Patient Measurements: Height: 5\' 6"  (167.6 cm) Weight: 199 lb 9.6 oz (90.538 kg) IBW/kg (Calculated) : 59.3 Heparin Dosing Weight: 80kg  Vital Signs: Temp: 98.7 F (37.1 C) (01/21 0415) Temp Source: Oral (01/21 0415) BP: 123/63 mmHg (01/21 0415) Pulse Rate: 87 (01/21 0415)  Labs:  Recent Labs  09/19/14 1511 09/19/14 2201  09/20/14 0327 09/20/14 0911 09/20/14 1046 09/20/14 2113 09/21/14 0508  HGB 10.9*  --   --   --  10.6*  --   --  10.5*  HCT 33.3*  --   --   --  32.5*  --   --  32.0*  PLT 278  --   --   --  274  --   --  253  LABPROT  --   --   --   --   --   --  13.8 13.1  INR  --   --   --   --   --   --  1.05 0.98  HEPARINUNFRC  --   --   < > 0.98*  --  0.96* 0.37 0.26*  CREATININE 0.88  --   --   --  0.75  --   --   --   TROPONINI  --  <0.03  --  <0.03 <0.03  --   --   --   < > = values in this interval not displayed.  Estimated Creatinine Clearance: 84.8 mL/min (by C-G formula based on Cr of 0.75).   Assessment: 59 year old female admitted to hospital for bilateral PE. Patient has history of PE/DVT, has been on warfarin in past and had a very difficult time managing her INRs and eventually  took herself of it.   Patient started on heparin 1/19, orders received to start warfarin 1/20. Case mgmt checking insurance for possible DOAC coverage( 20% copay).   Day #2/5 VTE overlap with heparin>>coumadin.  HL low this am at 0.26, cbc stable, no bleeding issues noted. INR normal at 0.9.  Goal of Therapy:  INR 2-3 Heparin level 0.3-0.7 units/ml Monitor platelets by anticoagulation protocol: Yes   Plan:  Heparin at 900 units/hr - check level this afternoon Warfarin 5mg  tonight Follow up possible conversion to [place] PharmD., BCPS Clinical Pharmacist Pager [phone] 09/21/2014 8:35 AM  Recheck of heparin level this afternoon is at goal (0.34). No bleeding noted. Continue at current rate.  09/21/2014 1:25 PM

## 2014-09-21 NOTE — Progress Notes (Signed)
ANTICOAGULATION CONSULT NOTE - Follow Up Consult  Pharmacy Consult for Heparin  Indication: pulmonary embolus  Allergies  Allergen Reactions  . Abilify [Aripiprazole] Other (See Comments)    Motor skills   . Augmentin [Amoxicillin-Pot Clavulanate] Nausea And Vomiting and Diarrhea  . Bactrim [Sulfamethoxazole-Trimethoprim] Diarrhea  . Ceftin [Cefuroxime Axetil] Diarrhea  . Diclofenac     Other reaction(s): Muscle Pain  . Indocin [Indomethacin] Other (See Comments)    Migraines   . Lisinopril Diarrhea and Other (See Comments)  . Morphine And Related Other (See Comments)    Chest tightness   . Penicillins Itching  . Simvastatin Other (See Comments)  . Wellbutrin [Bupropion] Other (See Comments)    Slurred speech  . Quetiapine Fumarate Palpitations    Patient Measurements: Height: 5\' 6"  (167.6 cm) Weight: 199 lb 9.6 oz (90.538 kg) IBW/kg (Calculated) : 59.3  Vital Signs: Temp: 98.7 F (37.1 C) (01/21 0415) Temp Source: Oral (01/21 0415) BP: 123/63 mmHg (01/21 0415) Pulse Rate: 87 (01/21 0415)  Labs:  Recent Labs  09/19/14 1511 09/19/14 2201  09/20/14 0327 09/20/14 0911 09/20/14 1046 09/20/14 2113 09/21/14 0508  HGB 10.9*  --   --   --  10.6*  --   --  10.5*  HCT 33.3*  --   --   --  32.5*  --   --  32.0*  PLT 278  --   --   --  274  --   --  253  LABPROT  --   --   --   --   --   --  13.8 13.1  INR  --   --   --   --   --   --  1.05 0.98  HEPARINUNFRC  --   --   < > 0.98*  --  0.96* 0.37 0.26*  CREATININE 0.88  --   --   --  0.75  --   --   --   TROPONINI  --  <0.03  --  <0.03 <0.03  --   --   --   < > = values in this interval not displayed.  Estimated Creatinine Clearance: 84.8 mL/min (by C-G formula based on Cr of 0.75).  Assessment: Sub-therapeutic heparin level x 1, no issues per RN.   Goal of Therapy:  Heparin level 0.3-0.7 units/ml Monitor platelets by anticoagulation protocol: Yes   Plan:  -Increase heparin drip to 900 units/hr -1300  HL -Daily CBC/HL -Monitor for bleeding  Narda Bonds 09/21/2014,6:10 AM

## 2014-09-22 LAB — PROTIME-INR
INR: 0.99 (ref 0.00–1.49)
Prothrombin Time: 13.2 seconds (ref 11.6–15.2)

## 2014-09-22 LAB — CBC
HCT: 32.7 % — ABNORMAL LOW (ref 36.0–46.0)
Hemoglobin: 10.5 g/dL — ABNORMAL LOW (ref 12.0–15.0)
MCH: 29.5 pg (ref 26.0–34.0)
MCHC: 32.1 g/dL (ref 30.0–36.0)
MCV: 91.9 fL (ref 78.0–100.0)
Platelets: 288 10*3/uL (ref 150–400)
RBC: 3.56 MIL/uL — ABNORMAL LOW (ref 3.87–5.11)
RDW: 16.5 % — ABNORMAL HIGH (ref 11.5–15.5)
WBC: 6.3 10*3/uL (ref 4.0–10.5)

## 2014-09-22 LAB — GLUCOSE, CAPILLARY
Glucose-Capillary: 143 mg/dL — ABNORMAL HIGH (ref 70–99)
Glucose-Capillary: 126 mg/dL — ABNORMAL HIGH (ref 70–99)
Glucose-Capillary: 112 mg/dL — ABNORMAL HIGH (ref 70–99)
Glucose-Capillary: 116 mg/dL — ABNORMAL HIGH (ref 70–99)

## 2014-09-22 LAB — HEPARIN LEVEL (UNFRACTIONATED): Heparin Unfractionated: 0.34 IU/mL (ref 0.30–0.70)

## 2014-09-22 MED ORDER — WARFARIN SODIUM 7.5 MG PO TABS
7.5000 mg | ORAL_TABLET | Freq: Once | ORAL | Status: AC
  Administered 2014-09-22: 7.5 mg via ORAL
  Filled 2014-09-22 (×2): qty 1

## 2014-09-22 NOTE — Progress Notes (Signed)
Physical Therapy Evaluation and Discharge Patient Details Name: Valerie Vazquez MRN: [id number] DOB: 11-19-1955 Today's Date: 09/22/2014   History of Present Illness  Valerie Vazquez is a 59 y.o. female presents with shortness of breath and Chest Pain. 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  Clinical Impression  Patient evaluated by Physical Therapy with no further acute PT needs identified. All education has been completed and the patient has no further questions. Minor balance deficits with decreased confidence. Easily correct with use of a rolling walker. States her difficulty breathing and altered balance began approximately 5 days prior to admission when she felt that she was developing a sinus infection and her head felt "foggy." Strength was within functional limits for tasks assessed. Feel she would benefit from outpatient PT in the future if her balance does not improve after "foggy" feeling resolves. She agrees and states she also does not feel further physical therapy services are warrented at this time. See below for any follow-up Physial Therapy or equipment needs. PT is signing off. Thank you for this referral.     Follow Up Recommendations      Equipment Recommendations  Rolling walker with 5" wheels    Recommendations for Other Services       Precautions / Restrictions Precautions Precautions: Fall Restrictions Weight Bearing Restrictions: No      Mobility  Bed Mobility Overal bed mobility: Modified Independent                Transfers Overall transfer level: Needs assistance Equipment used: Rolling walker (2 wheeled);None Transfers: Sit to/from Stand Sit to Stand: Supervision         General transfer comment: Supervision for safety. Mild sway noted without an assistive device. Correct with use of a rolling walker for support. No loss of balance or need for  assist.  Ambulation/Gait Ambulation/Gait assistance: Supervision Ambulation Distance (Feet): 350 Feet Assistive device: Rolling walker (2 wheeled);None Gait Pattern/deviations: Step-through pattern;Decreased stride length Gait velocity: decreased   General Gait Details: Supervision for safety. Educated on safe DME use with rolling walker and verbalizes understanding. No shortness of breath or loss of balance noted with this device. Ambulted without assistive device and pt reaches for furniture for stability however, did not require physical assist. States she feels more confident with use of RW for support.  Stairs            Wheelchair Mobility    Modified Rankin (Stroke Patients Only)       Balance Overall balance assessment: Needs assistance Sitting-balance support: No upper extremity supported;Feet supported Sitting balance-Leahy Scale: Normal     Standing balance support: No upper extremity supported Standing balance-Leahy Scale: Fair                               Pertinent Vitals/Pain Pain Assessment: 0-10 Pain Score: 5  Pain Location: abdomen Pain Descriptors / Indicators: Cramping Pain Intervention(s): Monitored during session;Repositioned   Rest: SpO2 98% on room air - HR 101 Ambulating: SpO2 98% on room air - HR 98    Home Living Family/patient expects to be discharged to:: Shelter/Homeless Living Arrangements: Spouse/significant other Available Help at Discharge: Family Type of Home: Homeless (Staying at shelter currently) Home Access: Level entry     Home Layout: One level Home Equipment: None      Prior Function Level of Independence: Independent  Hand Dominance   Dominant Hand: Left    Extremity/Trunk Assessment   Upper Extremity Assessment: Defer to OT evaluation           Lower Extremity Assessment: Overall WFL for tasks assessed         Communication   Communication: No difficulties   Cognition Arousal/Alertness: Awake/alert Behavior During Therapy: WFL for tasks assessed/performed Overall Cognitive Status: Within Functional Limits for tasks assessed                      General Comments General comments (skin integrity, edema, etc.): States her head feels "foggy" and believes she has a sinus infection. Reports this began about 5 days prior to admission, which is the same time she reports her breathing and balance became altered.    Exercises General Exercises - Lower Extremity Ankle Circles/Pumps: AROM;Both;10 reps;Supine      Assessment/Plan    PT Assessment Patent does not need any further PT services  PT Diagnosis Difficulty walking   PT Problem List    PT Treatment Interventions     PT Goals (Current goals can be found in the Care Plan section) Acute Rehab PT Goals Patient Stated Goal: none stated PT Goal Formulation: All assessment and education complete, DC therapy    Frequency     Barriers to discharge        Co-evaluation               End of Session   Activity Tolerance: Patient tolerated treatment well Patient left: in bed;with call bell/phone within reach;with family/visitor present Nurse Communication: Mobility status         Time: 3559-7416 PT Time Calculation (min) (ACUTE ONLY): 24 min   Charges:   PT Evaluation $Initial PT Evaluation Tier I: 1 Procedure PT Treatments $Gait Training: 8-22 mins   PT G CodesEllouise Newer 09/22/2014, 2:35 PM [place], [place]

## 2014-09-22 NOTE — Progress Notes (Signed)
Patient ID: Valerie Vazquez, female   DOB: Apr 10, 1956, 59 y.o.   MRN: [id number]  Received request in IR for possible repositioning of IVC filter.  Imaging reviewed.  The CTA on 1/19 shows questionable tiny PE in LUL.  I'm not 100% convinced by CTA that this is an acute PE.  Imaging at time of IVC filter placement on 03/28/14 at [facility] also reviewed.  Filter is positioned perfectly in infrarenal IVC with no tilt or malpositioning.  There is no indication to intervene at this time other than anticoagulation.  Filters allow small thrombi to pass in order to maintain IVC patency, and all PE is not preventable with an indwelling IVC filter.  Can consider obtaining an abdominal Xray to prove stable filter positioning.  No filter intervention is indicated currently.

## 2014-09-22 NOTE — Progress Notes (Signed)
[facility] PROGRESS NOTE  Valerie Vazquez FGH:[id number] DOB: 12/18/1955 DOA: 09/19/2014 PCP: [name], MD Interim summary: Valerie Vazquez is a 59 y.o. female presents with shortness of breath and Chest Pain. 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 She stopped coumadin because she could n't pay for the follow up co pays. She was found to have bilateral small PE. She also reports having a IVC filter put in last year July at [facility]. She was started on IV heparin and awaiting insurance details to see if she can get NOAC. As per the patient ,she was told her IVC filter is malpositioned. We have requested IR to see if it can be positioned.   Assessment/Plan: 1. Bilateral pulmonary embolism: small without right heart strain.  LE dopplers negative. On IV heparin. We are still working to see if she can be on eliquis, Meanwhile coumadin started.   2. Shortness of breath and cough:  From the above. Improved.    3. New diagnosis of Diabetes Mellitus.  Diabetes education/ co ordinator consult.  Will start her metformin on discharge.  SSI.   4. Hypertension:  Controlled.   Right hip pain: Pain control, PT and x ray of the right hip showed severe osteoarthritis.  CT hip ordered and will probably call orthopedics for further evaluation, if abnormal. CT does not show any bursitis. Outpatient follow up.    Sinusitis and bronchitis: Started her on decongestants and po levaquin.    Constipation: Minimize narcotic pain meds and started her on colace an dmiralax.     Code Status: full code.  Family Communication: family atbedside Disposition Plan: pending.    Consultants:  Physical therapy  IR  Procedures:  CTA OF THE CHEST  Antibiotics:  LEVAQUIN  HPI/Subjective: Sinus congestion. Headache.   Objective: Filed Vitals:   09/22/14 0816  BP: 132/56  Pulse:   Temp:   Resp:     Intake/Output Summary (Last 24  hours) at 09/22/14 1015 Last data filed at 09/22/14 0914  Gross per 24 hour  Intake    240 ml  Output    500 ml  Net   -260 ml   Filed Weights   09/19/14 1505 09/19/14 2032  Weight: 90.266 kg (199 lb) 90.538 kg (199 lb 9.6 oz)    Exam:   General:  Alert afebrile comfortable  Cardiovascular: s1s2  Respiratory: ctab  Abdomen: soft non tender non distended bowel sounds heard.   Musculoskeletal: no pedal edema.   Data Reviewed: Basic Metabolic Panel:  Recent Labs Lab 09/19/14 1511 09/20/14 0911  NA 141 141  K 4.3 3.9  CL 105 105  CO2 28 26  GLUCOSE 143* 128*  BUN 14 10  CREATININE 0.88 0.75  CALCIUM 9.3 9.0   Liver Function Tests:  Recent Labs Lab 09/20/14 0911  AST 22  ALT 20  ALKPHOS 90  BILITOT 0.6  PROT 6.3  ALBUMIN 3.3*   No results for input(s): LIPASE, AMYLASE in the last 168 hours. No results for input(s): AMMONIA in the last 168 hours. CBC:  Recent Labs Lab 09/19/14 1511 09/20/14 0911 09/21/14 0508 09/22/14 0500  WBC 9.3 7.5 6.4 6.3  HGB 10.9* 10.6* 10.5* 10.5*  HCT 33.3* 32.5* 32.0* 32.7*  MCV 89.0 88.6 91.2 91.9  PLT 278 274 253 288   Cardiac Enzymes:  Recent Labs Lab 09/19/14 2201 09/20/14 0327 09/20/14 0911  TROPONINI <0.03 <0.03 <0.03   BNP (last 3 results) No  results for input(s): PROBNP in the last 8760 hours. CBG:  Recent Labs Lab 09/21/14 0606 09/21/14 1119 09/21/14 1634 09/21/14 2108 09/22/14 0616  GLUCAP 126* 91 123* 111* 143*    Recent Results (from the past 240 hour(s))  MRSA PCR Screening     Status: None   Collection Time: 09/19/14 11:50 PM  Result Value Ref Range Status   MRSA by PCR NEGATIVE NEGATIVE Final    Comment:        The GeneXpert MRSA Assay (FDA approved for NASAL specimens only), is one component of a comprehensive MRSA colonization surveillance program. It is not intended to diagnose MRSA infection nor to guide or monitor treatment for MRSA infections.      Studies: Ct Hip  Right Wo Contrast  09/21/2014   CLINICAL DATA:  Right hip pain.  EXAM: CT OF THE RIGHT HIP WITHOUT CONTRAST  TECHNIQUE: Multidetector CT imaging of the right hip was performed according to the standard protocol. Multiplanar CT image reconstructions were also generated.  COMPARISON:  Radiographs dated 09/20/2014 and CT scan dated 06/22/2014  FINDINGS: There is no fracture or dislocation. There is moderate osteoarthritis with degenerative osteophytes in the acetabulum and femoral head. The appearance is essentially unchanged since the prior CT scan 06/22/2014. There are calcifications in the distal gluteal tendons at the insertions on the superior aspect of the greater trochanter, also unchanged. There are enthesophytes at the inferior aspect of the right greater trochanter which is slightly more prominent. There is no appreciable joint effusion or appreciable bursitis.  No adenopathy or mass lesions.  IMPRESSION: No acute abnormality of the right hip. Moderate osteoarthritic changes with chronic degenerative or posttraumatic changes of the distal gluteal tendons.   Electronically Signed   By: Rozetta Nunnery M.D.   On: 09/21/2014 13:45   Dg Hip Unilat With Pelvis 2-3 Views Right  09/20/2014   CLINICAL DATA:  59 year old female with a history of right hip pain for 1 month. No known injury.  EXAM: DG HIP W/ PELVIS 2-3V*R*  COMPARISON:  CT dated 06/22/2014  FINDINGS: Bony pelvic ring appears intact with no acute bony abnormality.  Right hip appears congruent into views, with degenerative changes of the right hip. Heterotopic ossification superior to the greater trochanter.  Surgical changes of left total hip arthroplasty or incompletely imaged.  Moderate stool burden.  No acute fracture line identified.  IMPRESSION: No acute bony abnormality identified.  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  Incompletely visualized left hip total arthroplasty.  Signed,  Dulcy Fanny. Earleen Newport, DO  [facility]  [facility]   Electronically Signed   By: Corrie Mckusick D.O.   On: 09/20/2014 18:33    Scheduled Meds: . diltiazem  120 mg Oral Daily  . fluticasone  2 spray Each Nare Daily  . gabapentin  600 mg Oral TID  . HYDROcodone-acetaminophen  1 tablet Oral 4 times per day  . insulin aspart  0-9 Units Subcutaneous TID WC  . levofloxacin  250 mg Oral Daily  . loratadine  10 mg Oral Daily  . pantoprazole  40 mg Oral Daily  . polyethylene glycol  17 g Oral Daily  . senna-docusate  2 tablet Oral BID  . sodium chloride  3 mL Intravenous Q12H  . traZODone  100 mg Oral QHS  . warfarin  7.5 mg Oral ONCE-1800  . Warfarin - Pharmacist Dosing Inpatient   Does not apply q1800   Continuous Infusions: . heparin 900 Units/hr (  09/21/14 0617)    Active Problems:   Essential hypertension, benign   GERD (gastroesophageal reflux disease)   Pulmonary embolism    Time spent: 25 min    Hazleigh Mccleave  [facility] Pager [phone] If 7PM-7AM, please contact night-coverage at [email or web address], password [facility] 09/22/2014, 10:15 AM  LOS: 3 days

## 2014-09-22 NOTE — Progress Notes (Signed)
Medicare Important Message given? YES  (If response is "NO", the following Medicare IM given date fields will be blank)  Date Medicare IM given: 09/22/14 Medicare IM given by:  Thurza Kwiecinski  

## 2014-09-22 NOTE — Progress Notes (Signed)
Inpatient Diabetes Program Recommendations  AACE/ADA: New Consensus Statement on Inpatient Glycemic Control (2013)  Target Ranges:  Prepandial:   less than 140 mg/dL      Peak postprandial:   less than 180 mg/dL (1-2 hours)      Critically ill patients:  140 - 180 mg/dL   Reason for Visit: consult LATE ENTRY: This coordinator met with pt and husband yesterday 1/21 to discuss A1C=6.5. Pt reports a history of DM and was at one time on Metformin but lost approximately 30 lbs and stopped Metformin.  Discussed basic carb counting and left handouts for diabetes meal planning.  No questions/concerns at the end of our visit.  Thank you  Raoul Pitch BSN, RN,CDE Inpatient Diabetes Coordinator [phone] (team pager)

## 2014-09-22 NOTE — Progress Notes (Signed)
  ANTICOAGULATION CONSULT NOTE - Follow Up Consult  Pharmacy Consult for heparin>>warfarin Indication: pulmonary embolus  Allergies  Allergen Reactions  . Abilify [Aripiprazole] Other (See Comments)    Motor skills   . Augmentin [Amoxicillin-Pot Clavulanate] Nausea And Vomiting and Diarrhea  . Bactrim [Sulfamethoxazole-Trimethoprim] Diarrhea  . Ceftin [Cefuroxime Axetil] Diarrhea  . Diclofenac     Other reaction(s): Muscle Pain  . Indocin [Indomethacin] Other (See Comments)    Migraines   . Lisinopril Diarrhea and Other (See Comments)  . Morphine And Related Other (See Comments)    Chest tightness   . Penicillins Itching  . Simvastatin Other (See Comments)  . Wellbutrin [Bupropion] Other (See Comments)    Slurred speech  . Quetiapine Fumarate Palpitations    Patient Measurements: Height: 5\' 6"  (167.6 cm) Weight: 199 lb 9.6 oz (90.538 kg) IBW/kg (Calculated) : 59.3 Heparin Dosing Weight: 80kg  Vital Signs: Temp: 98.3 F (36.8 C) (01/22 0440) Temp Source: Oral (01/22 0440) BP: 132/56 mmHg (01/22 0816) Pulse Rate: 74 (01/22 0440)  Labs:  Recent Labs  09/19/14 1511 09/19/14 2201 09/20/14 0327 09/20/14 0911  09/20/14 2113 09/21/14 0508 09/21/14 1120 09/22/14 0406 09/22/14 0500  HGB 10.9*  --   --  10.6*  --   --  10.5*  --   --  10.5*  HCT 33.3*  --   --  32.5*  --   --  32.0*  --   --  32.7*  PLT 278  --   --  274  --   --  253  --   --  288  LABPROT  --   --   --   --   --  13.8 13.1  --   --  13.2  INR  --   --   --   --   --  1.05 0.98  --   --  0.99  HEPARINUNFRC  --   --  0.98*  --   < > 0.37 0.26* 0.34 0.34  --   CREATININE 0.88  --   --  0.75  --   --   --   --   --   --   TROPONINI  --  <0.03 <0.03 <0.03  --   --   --   --   --   --   < > = values in this interval not displayed.  Estimated Creatinine Clearance: 84.8 mL/min (by C-G formula based on Cr of 0.75).   Assessment: 59 year old female admitted to hospital for bilateral PE. Patient has  history of PE/DVT, has been on warfarin in past and had a very difficult time managing her INRs and eventually took herself of it.   Patient started on heparin 1/19, orders received to start warfarin 1/20. Case mgmt checking insurance for possible DOAC coverage (20% copay).   Day #3/5 VTE overlap with heparin>>coumadin. NOTE: INR must be therapeutic for 24h before heparin can be stopped.  HL remains therapeutic. INR slow to rise- 0.99. cbc stable, no bleeding issues noted.  Goal of Therapy:  INR 2-3 Heparin level 0.3-0.7 units/ml Monitor platelets by anticoagulation protocol: Yes   Plan:  -continue Heparin at 900 units/hr  -Warfarin 7.5mg  tonight -daily HL, INR and CBC -Follow up possible conversion to [place] D. Velvet Moomaw, PharmD, BCPS Clinical Pharmacist Pager: [phone] 09/22/2014 9:43 AM

## 2014-09-23 LAB — CBC
HCT: 34.4 % — ABNORMAL LOW (ref 36.0–46.0)
Hemoglobin: 11.1 g/dL — ABNORMAL LOW (ref 12.0–15.0)
MCH: 29 pg (ref 26.0–34.0)
MCHC: 32.3 g/dL (ref 30.0–36.0)
MCV: 89.8 fL (ref 78.0–100.0)
Platelets: 288 10*3/uL (ref 150–400)
RBC: 3.83 MIL/uL — ABNORMAL LOW (ref 3.87–5.11)
RDW: 16.5 % — ABNORMAL HIGH (ref 11.5–15.5)
WBC: 6.7 10*3/uL (ref 4.0–10.5)

## 2014-09-23 LAB — PROTIME-INR
INR: 1.06 (ref 0.00–1.49)
Prothrombin Time: 13.9 seconds (ref 11.6–15.2)

## 2014-09-23 LAB — GLUCOSE, CAPILLARY
Glucose-Capillary: 90 mg/dL (ref 70–99)
Glucose-Capillary: 148 mg/dL — ABNORMAL HIGH (ref 70–99)
Glucose-Capillary: 123 mg/dL — ABNORMAL HIGH (ref 70–99)
Glucose-Capillary: 136 mg/dL — ABNORMAL HIGH (ref 70–99)

## 2014-09-23 LAB — BASIC METABOLIC PANEL
Anion gap: 7 (ref 5–15)
BUN: 11 mg/dL (ref 6–23)
CO2: 29 mmol/L (ref 19–32)
Calcium: 9.3 mg/dL (ref 8.4–10.5)
Chloride: 103 mmol/L (ref 96–112)
Creatinine, Ser: 0.87 mg/dL (ref 0.50–1.10)
GFR calc Af Amer: 82 mL/min — ABNORMAL LOW (ref >90)
GFR calc non Af Amer: 71 mL/min — ABNORMAL LOW (ref >90)
Glucose, Bld: 104 mg/dL — ABNORMAL HIGH (ref 70–99)
Potassium: 4.1 mmol/L (ref 3.5–5.1)
Sodium: 139 mmol/L (ref 135–145)

## 2014-09-23 LAB — HEPARIN LEVEL (UNFRACTIONATED): Heparin Unfractionated: 0.4 IU/mL (ref 0.30–0.70)

## 2014-09-23 MED ORDER — DILTIAZEM HCL 60 MG PO TABS: 60.0000 mg | ORAL_TABLET | Freq: Every day | ORAL | Status: DC

## 2014-09-23 MED ORDER — WARFARIN SODIUM 10 MG PO TABS
10.0000 mg | ORAL_TABLET | Freq: Once | ORAL | Status: AC
  Administered 2014-09-23: 10 mg via ORAL
  Filled 2014-09-23 (×2): qty 1

## 2014-09-23 MED ORDER — SODIUM CHLORIDE 0.9 % IV BOLUS (SEPSIS)
1000.0000 mL | Freq: Once | INTRAVENOUS | Status: AC
  Administered 2014-09-23: 1000 mL via INTRAVENOUS

## 2014-09-23 NOTE — Progress Notes (Signed)
[facility] PROGRESS NOTE  Valerie Vazquez TOI:[id number] DOB: 1956/03/03 DOA: 09/19/2014 PCP: [name], MD Interim summary: Valerie Vazquez is a 59 y.o. female presents with shortness of breath and Chest Pain. 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 She stopped coumadin because she could n't pay for the follow up co pays. She was found to have bilateral small PE. She also reports having a IVC filter put in last year July at [facility]. She was started on IV heparin and awaiting insurance details to see if she can get NOAC. As per the patient ,she was told her IVC filter is malpositioned. We have requested IR to see if it can be positioned. Dr Kathlene Cote looked at the films and recommended no intervention needed at this time.   Assessment/Plan: 1. Bilateral pulmonary embolism: small without right heart strain.  LE dopplers negative. On IV heparin. We are still working to see if she can be on eliquis, Meanwhile coumadin started. Her INR is sub therapeutic.   2. Shortness of breath and cough:  From the above. Improved.    3. New diagnosis of Diabetes Mellitus.  Diabetes education/ co ordinator consult.  Will start her metformin on discharge.  SSI.   4. Hypertension:  Controlled.   Right hip pain: Pain control, PT and x ray of the right hip showed severe osteoarthritis.  CT hip ordered and will probably call orthopedics for further evaluation, if abnormal. CT does not show any bursitis. Outpatient follow up.    Sinusitis and bronchitis: Started her on decongestants and po levaquin.    Constipation: Minimize narcotic pain meds and started her on colace an dmiralax.   Orthostatic hypotension: Stop the diltiazem and she will be restarted on the blood pressure medications as needed.  Give one liter NS bolus over the next two hours.     Code Status: full code.  Family Communication: family atbedside Disposition Plan:  pending.    Consultants:  Physical therapy  IR  Procedures:  CTA OF THE CHEST  Antibiotics:  LEVAQUIN for 3 days.   HPI/Subjective: Sinus congestion. Headache.   Objective: Filed Vitals:   09/23/14 1433  BP: 124/66  Pulse: 99  Temp: 98.9 F (37.2 C)  Resp: 18    Intake/Output Summary (Last 24 hours) at 09/23/14 1530 Last data filed at 09/23/14 1400  Gross per 24 hour  Intake 1304.63 ml  Output      0 ml  Net 1304.63 ml   Filed Weights   09/19/14 1505 09/19/14 2032  Weight: 90.266 kg (199 lb) 90.538 kg (199 lb 9.6 oz)    Exam:   General:  Alert afebrile comfortable  Cardiovascular: s1s2  Respiratory: ctab  Abdomen: soft non tender non distended bowel sounds heard.   Musculoskeletal: no pedal edema.   Data Reviewed: Basic Metabolic Panel:  Recent Labs Lab 09/19/14 1511 09/20/14 0911 09/23/14 0556  NA 141 141 139  K 4.3 3.9 4.1  CL 105 105 103  CO2 28 26 29   GLUCOSE 143* 128* 104*  BUN 14 10 11   CREATININE 0.88 0.75 0.87  CALCIUM 9.3 9.0 9.3   Liver Function Tests:  Recent Labs Lab 09/20/14 0911  AST 22  ALT 20  ALKPHOS 90  BILITOT 0.6  PROT 6.3  ALBUMIN 3.3*   No results for input(s): LIPASE, AMYLASE in the last 168 hours. No results for input(s): AMMONIA in the last 168 hours. CBC:  Recent Labs Lab 09/19/14  1511 09/20/14 0911 09/21/14 0508 09/22/14 0500 09/23/14 0556  WBC 9.3 7.5 6.4 6.3 6.7  HGB 10.9* 10.6* 10.5* 10.5* 11.1*  HCT 33.3* 32.5* 32.0* 32.7* 34.4*  MCV 89.0 88.6 91.2 91.9 89.8  PLT 278 274 253 288 288   Cardiac Enzymes:  Recent Labs Lab 09/19/14 2201 09/20/14 0327 09/20/14 0911  TROPONINI <0.03 <0.03 <0.03   BNP (last 3 results) No results for input(s): PROBNP in the last 8760 hours. CBG:  Recent Labs Lab 09/22/14 1127 09/22/14 1611 09/22/14 2131 09/23/14 0609 09/23/14 1135  GLUCAP 126* 112* 116* 90 148*    Recent Results (from the past 240 hour(s))  MRSA PCR Screening     Status:  None   Collection Time: 09/19/14 11:50 PM  Result Value Ref Range Status   MRSA by PCR NEGATIVE NEGATIVE Final    Comment:        The GeneXpert MRSA Assay (FDA approved for NASAL specimens only), is one component of a comprehensive MRSA colonization surveillance program. It is not intended to diagnose MRSA infection nor to guide or monitor treatment for MRSA infections.      Studies: No results found.  Scheduled Meds: . fluticasone  2 spray Each Nare Daily  . gabapentin  600 mg Oral TID  . HYDROcodone-acetaminophen  1 tablet Oral 4 times per day  . insulin aspart  0-9 Units Subcutaneous TID WC  . levofloxacin  250 mg Oral Daily  . loratadine  10 mg Oral Daily  . pantoprazole  40 mg Oral Daily  . polyethylene glycol  17 g Oral Daily  . senna-docusate  2 tablet Oral BID  . sodium chloride  3 mL Intravenous Q12H  . traZODone  100 mg Oral QHS  . warfarin  10 mg Oral ONCE-1800  . Warfarin - Pharmacist Dosing Inpatient   Does not apply q1800   Continuous Infusions: . heparin 900 Units/hr (09/23/14 0800)    Active Problems:   Essential hypertension, benign   GERD (gastroesophageal reflux disease)   Pulmonary embolism    Time spent: 25 min    Ah Bott  [facility] Pager [phone] If 7PM-7AM, please contact night-coverage at [email or web address], password [facility] 09/23/2014, 3:30 PM  LOS: 4 days

## 2014-09-23 NOTE — Progress Notes (Signed)
  [place] for heparin>>warfarin Indication: pulmonary embolus  Allergies  Allergen Reactions  . Abilify [Aripiprazole] Other (See Comments)    Motor skills   . Augmentin [Amoxicillin-Pot Clavulanate] Nausea And Vomiting and Diarrhea  . Bactrim [Sulfamethoxazole-Trimethoprim] Diarrhea  . Ceftin [Cefuroxime Axetil] Diarrhea  . Diclofenac     Other reaction(s): Muscle Pain  . Indocin [Indomethacin] Other (See Comments)    Migraines   . Lisinopril Diarrhea and Other (See Comments)  . Morphine And Related Other (See Comments)    Chest tightness   . Penicillins Itching  . Simvastatin Other (See Comments)  . Wellbutrin [Bupropion] Other (See Comments)    Slurred speech  . Quetiapine Fumarate Palpitations    Patient Measurements: Height: 5\' 6"  (167.6 cm) Weight: 199 lb 9.6 oz (90.538 kg) IBW/kg (Calculated) : 59.3 Heparin Dosing Weight: 80kg  Vital Signs: Temp: 98.2 F (36.8 C) (01/23 0447) Temp Source: Oral (01/23 0447) BP: 124/74 mmHg (01/23 0832) Pulse Rate: 76 (01/23 0447)  Labs:  Recent Labs  09/21/14 0508 09/21/14 1120 09/22/14 0406 09/22/14 0500 09/23/14 0556  HGB 10.5*  --   --  10.5* 11.1*  HCT 32.0*  --   --  32.7* 34.4*  PLT 253  --   --  288 288  LABPROT 13.1  --   --  13.2 13.9  INR 0.98  --   --  0.99 1.06  HEPARINUNFRC 0.26* 0.34 0.34  --  0.40  CREATININE  --   --   --   --  0.87    Estimated Creatinine Clearance: 77.9 mL/min (by C-G formula based on Cr of 0.87).   Assessment: 59 year old female admitted to hospital for bilateral PE. Patient has history of PE/DVT, has been on warfarin in past and had a very difficult time managing her INRs and eventually took herself off it.   Patient started on heparin 1/19, warfarin 1/20. Case mgmt checking insurance for possible DOAC coverage (20% copay). Now looking into company sponsored medication assistance.  Day #4/5 VTE overlap with heparin>>coumadin. NOTE: INR  must be therapeutic for 24h before heparin can be stopped.  HL remains therapeutic. INR slow to rise- 1.0. cbc stable, no bleeding issues noted.  Goal of Therapy:  INR 2-3 Heparin level 0.3-0.7 units/ml Monitor platelets by anticoagulation protocol: Yes   Plan:  -continue Heparin at 900 units/hr  -Warfarin 10mg  tonight -daily HL, INR and CBC -Follow up possible conversion to Deputy PharmD., BCPS Clinical Pharmacist Pager [phone] 09/23/2014 10:52 AM

## 2014-09-24 LAB — GLUCOSE, CAPILLARY
Glucose-Capillary: 104 mg/dL — ABNORMAL HIGH (ref 70–99)
Glucose-Capillary: 103 mg/dL — ABNORMAL HIGH (ref 70–99)

## 2014-09-24 LAB — CBC
HCT: 33 % — ABNORMAL LOW (ref 36.0–46.0)
Hemoglobin: 10.6 g/dL — ABNORMAL LOW (ref 12.0–15.0)
MCH: 29 pg (ref 26.0–34.0)
MCHC: 32.1 g/dL (ref 30.0–36.0)
MCV: 90.4 fL (ref 78.0–100.0)
Platelets: 290 10*3/uL (ref 150–400)
RBC: 3.65 MIL/uL — ABNORMAL LOW (ref 3.87–5.11)
RDW: 16.6 % — ABNORMAL HIGH (ref 11.5–15.5)
WBC: 7.3 10*3/uL (ref 4.0–10.5)

## 2014-09-24 LAB — HEPARIN LEVEL (UNFRACTIONATED): Heparin Unfractionated: 0.22 IU/mL — ABNORMAL LOW (ref 0.30–0.70)

## 2014-09-24 LAB — PROTIME-INR
INR: 1.02 (ref 0.00–1.49)
Prothrombin Time: 13.5 seconds (ref 11.6–15.2)

## 2014-09-24 MED ORDER — ENOXAPARIN SODIUM 150 MG/ML ~~LOC~~ SOLN: 1.5000 mg/kg | SUBCUTANEOUS | Status: DC

## 2014-09-24 MED ORDER — WARFARIN SODIUM 10 MG PO TABS: 10.0000 mg | ORAL_TABLET | Freq: Every day | ORAL | Status: DC

## 2014-09-24 MED ORDER — ENOXAPARIN (LOVENOX) PATIENT EDUCATION KIT
PACK | Freq: Once | Status: AC
  Administered 2014-09-24: 14:00:00
  Filled 2014-09-24: qty 1

## 2014-09-24 MED ORDER — WARFARIN SODIUM 10 MG PO TABS
10.0000 mg | ORAL_TABLET | Freq: Every day | ORAL | Status: DC
  Administered 2014-09-24: 10 mg via ORAL
  Filled 2014-09-24: qty 1

## 2014-09-24 MED ORDER — METFORMIN HCL 500 MG PO TABS: 500.0000 mg | ORAL_TABLET | Freq: Every day | ORAL | Status: DC

## 2014-09-24 MED ORDER — ENOXAPARIN SODIUM 150 MG/ML ~~LOC~~ SOLN
1.5000 mg/kg | SUBCUTANEOUS | Status: DC
  Administered 2014-09-24: 135 mg via SUBCUTANEOUS
  Filled 2014-09-24: qty 1

## 2014-09-24 NOTE — Progress Notes (Signed)
ANTICOAGULATION CONSULT NOTE - Follow Up Consult  Pharmacy Consult for Heparin  Indication: pulmonary embolus  Allergies  Allergen Reactions  . Abilify [Aripiprazole] Other (See Comments)    Motor skills   . Augmentin [Amoxicillin-Pot Clavulanate] Nausea And Vomiting and Diarrhea  . Bactrim [Sulfamethoxazole-Trimethoprim] Diarrhea  . Ceftin [Cefuroxime Axetil] Diarrhea  . Diclofenac     Other reaction(s): Muscle Pain  . Indocin [Indomethacin] Other (See Comments)    Migraines   . Lisinopril Diarrhea and Other (See Comments)  . Morphine And Related Other (See Comments)    Chest tightness   . Penicillins Itching  . Simvastatin Other (See Comments)  . Wellbutrin [Bupropion] Other (See Comments)    Slurred speech  . Quetiapine Fumarate Palpitations    Patient Measurements: Height: 5\' 6"  (167.6 cm) Weight: 199 lb 9.6 oz (90.538 kg) IBW/kg (Calculated) : 59.3  Vital Signs: Temp: 98.5 F (36.9 C) (01/23 2022) Temp Source: Oral (01/23 2022) BP: 136/64 mmHg (01/23 2022) Pulse Rate: 99 (01/23 2022)  Labs:  Recent Labs  09/22/14 0406 09/22/14 0500 09/23/14 0556 09/24/14 0302  HGB  --  10.5* 11.1* 10.6*  HCT  --  32.7* 34.4* 33.0*  PLT  --  288 288 290  LABPROT  --  13.2 13.9 13.5  INR  --  0.99 1.06 1.02  HEPARINUNFRC 0.34  --  0.40 0.22*  CREATININE  --   --  0.87  --     Estimated Creatinine Clearance: 77.9 mL/min (by C-G formula based on Cr of 0.87).  Assessment: Sub-therapeutic heparin level x 1, no issues per RN. INR not moving on warfarin.   Goal of Therapy:  Heparin level 0.3-0.7 units/ml Monitor platelets by anticoagulation protocol: Yes   Plan:  -Increase heparin drip to 1050 units/hr -1200 HL -Daily CBC/HL -Monitor for bleeding  Narda Bonds 09/24/2014,4:24 AM

## 2014-09-24 NOTE — Progress Notes (Signed)
  [place] for lovenox>>warfarin Indication: pulmonary embolus  Allergies  Allergen Reactions  . Abilify [Aripiprazole] Other (See Comments)    Motor skills   . Augmentin [Amoxicillin-Pot Clavulanate] Nausea And Vomiting and Diarrhea  . Bactrim [Sulfamethoxazole-Trimethoprim] Diarrhea  . Ceftin [Cefuroxime Axetil] Diarrhea  . Diclofenac     Other reaction(s): Muscle Pain  . Indocin [Indomethacin] Other (See Comments)    Migraines   . Lisinopril Diarrhea and Other (See Comments)  . Morphine And Related Other (See Comments)    Chest tightness   . Penicillins Itching  . Simvastatin Other (See Comments)  . Wellbutrin [Bupropion] Other (See Comments)    Slurred speech  . Quetiapine Fumarate Palpitations    Patient Measurements: Height: 5\' 6"  (167.6 cm) Weight: 199 lb 9.6 oz (90.538 kg) IBW/kg (Calculated) : 59.3 Heparin Dosing Weight: 80kg  Vital Signs: Temp: 98.1 F (36.7 C) (01/24 0517) Temp Source: Oral (01/24 0517) BP: 102/55 mmHg (01/24 0517) Pulse Rate: 82 (01/24 0517)  Labs:  Recent Labs  09/22/14 0406  09/22/14 0500 09/23/14 0556 09/24/14 0302  HGB  --   < > 10.5* 11.1* 10.6*  HCT  --   --  32.7* 34.4* 33.0*  PLT  --   --  288 288 290  LABPROT  --   --  13.2 13.9 13.5  INR  --   --  0.99 1.06 1.02  HEPARINUNFRC 0.34  --   --  0.40 0.22*  CREATININE  --   --   --  0.87  --   < > = values in this interval not displayed.  Estimated Creatinine Clearance: 77.9 mL/min (by C-G formula based on Cr of 0.87).   Assessment: 59 year old female admitted to hospital for bilateral PE. Patient has history of PE/DVT, has been on warfarin in past and had a very difficult time managing her INRs and eventually took herself off it.   Patient started on heparin 1/19, warfarin 1/20. Case mgmt checking insurance for possible DOAC coverage (20% copay). Now looking into company sponsored medication assistance.  Day #5/5 VTE overlap with  heparin>>coumadin.   Received MD orders to transition patient to lovenox today. Will dose 1.5mg /kg/day =135mg  daily for ease of administration.   Would recommend continuing warfarin 10mg  daily for now with recheck later this week.  Goal of Therapy:  INR 2-3 Heparin level 0.3-0.7 units/ml Monitor platelets by anticoagulation protocol: Yes   Plan:  -Discontinue heparin now - give lovenox in 1 hour (135mg  daily) -Warfarin 10mg  daily for now with INR check later this week -daily HL, INR and CBC  Erin Hearing PharmD., BCPS Clinical Pharmacist Pager [phone] 09/24/2014 11:57 AM

## 2014-09-24 NOTE — Discharge Instructions (Signed)

## 2014-09-24 NOTE — Clinical Social Work Note (Signed)
Case manager notified of patient's need for Lovenox injections.    Liz Beach MSW, [place], [place], [phone]

## 2014-09-24 NOTE — Progress Notes (Signed)
Discharged to home with family office visits in place teaching done  

## 2014-09-25 NOTE — Discharge Summary (Signed)
Physician Discharge Summary  Valerie Vazquez XBJ:[id number] DOB: 11-Aug-1956 DOA: 09/19/2014  PCP: [name], MD  Admit date: 09/19/2014 Discharge date: 09/24/2014  Time spent: 6minutes  Recommendations for Outpatient Follow-up:  1. Follow up with PCP in less than one week 2. Follow upwith hematology in 2 to 4 weeks.  3. Follow up with orthopedics as needed for your right hip 4. Please check your INR on Tuesday and Friday and stop lovenox if INR is 2.   Discharge Diagnoses:  Active Problems:   Essential hypertension, benign   GERD (gastroesophageal reflux disease)   Pulmonary embolism   Discharge Condition: improved  Diet recommendation: regular diet  Filed Weights   09/19/14 1505 09/19/14 2032  Weight: 90.266 kg (199 lb) 90.538 kg (199 lb 9.6 oz)    History of present illness:  Valerie Vazquez is a 59 y.o. female presents with shortness of breath and Chest Pain. 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 She stopped coumadin because she could n't pay for the follow up co pays. She was found to have bilateral small PE. She also reports having a IVC filter put in last year July at [facility]. She was started on IV heparin and awaiting insurance details to see if she can get NOAC. She started the application for eliquis and she will know in one week.  As per the patient ,she was told her IVC filter is malpositioned. We have requested IR to see if it can be positioned. Dr Kathlene Cote looked at the films and recommended no intervention needed at this time.   Hospital Course:  1. Bilateral pulmonary embolism: small without right heart strain. LE dopplers negative. She has been on IV heparin for 5 days as a bridge  And coumadin started. Her INR remains sub therapeutic. She was discharged home on lovenox and coumadin. She waas recommended to check INR on Tuesday and Friday and stop the lovenox injection if INR was equal to or greater than 2.  She was also recommended to continue coumadin. She was referred to a hematologist to follow up in [place].   2. Shortness of breath and cough:  From the above. Improved.    3. New diagnosis of Diabetes Mellitus.  Diabetes education/ co ordinator consult.  Will start her metformin on discharge.  SSI used while inpatient.   4. Hypertension:  Controlled.   Right hip pain: Pain control, PT and x ray of the right hip showed severe osteoarthritis.  CT hip ordered and will probably call orthopedics for further evaluation, if abnormal. CT does not show any bursitis. Outpatient follow up.    Sinusitis and bronchitis: Started her on decongestants and po levaquin. She had completed the course of antibiotic.    Constipation: Minimize narcotic pain meds and started her on colace an dmiralax. Her constipation resolved.   Orthostatic hypotension: Stop the diltiazem and she will be restarted on the blood pressure medications as needed.  Give one liter NS bolus over the next two hours.  Her repeat orthostatics were negative .  Procedures:  CT hip  Consultations:  Social work  IR  Physical therapy  Discharge Exam: Filed Vitals:   09/24/14 0517  BP: 102/55  Pulse: 82  Temp: 98.1 F (36.7 C)  Resp: 18    General: alert afebrile comfortable Cardiovascular: s1s2 Respiratory: ctab  Discharge Instructions   Discharge Instructions    Discharge instructions    Complete by:  As directed  Follow up with your PCP in one week.  Please check INR on Tuesday and Friday , when INR is greater than 2 , patient can stop the lovenox but continue with coumadin.  Please request your PCP to refer to a hematologist and orthopedics.          Discharge Medication List as of 09/24/2014  3:09 PM    START taking these medications   Details  enoxaparin (LOVENOX) 150 MG/ML injection Inject 0.91 mLs (135 mg total) into the skin daily., Starting 09/24/2014, Until Discontinued, Print     metFORMIN (GLUCOPHAGE) 500 MG tablet Take 1 tablet (500 mg total) by mouth daily with breakfast., Starting 09/24/2014, Until Discontinued, Print    warfarin (COUMADIN) 10 MG tablet Take 1 tablet (10 mg total) by mouth daily at 6 PM., Starting 09/24/2014, Until Discontinued, Print      CONTINUE these medications which have NOT CHANGED   Details  gabapentin (NEURONTIN) 600 MG tablet Take 600 mg by mouth 3 (three) times daily., Until Discontinued, Historical Med    loratadine (CLARITIN) 10 MG tablet Take 10 mg by mouth daily., Until Discontinued, Historical Med    Menthol-Camphor (ICY HOT ARTHRITIS PAIN RELIEF) 16-4 % LOTN Apply 1 application topically daily as needed (for pain)., Until Discontinued, Historical Med    pantoprazole (PROTONIX) 40 MG tablet Take 40 mg by mouth daily., Until Discontinued, Historical Med    traZODone (DESYREL) 100 MG tablet Take 100 mg by mouth at bedtime. , Until Discontinued, Historical Med      STOP taking these medications     cyclobenzaprine (FLEXERIL) 5 MG tablet      dicyclomine (BENTYL) 10 MG capsule      diltiazem (CARDIZEM CD) 120 MG 24 hr capsule      etodolac (LODINE) 400 MG tablet      HYDROcodone-acetaminophen (NORCO) 5-325 MG per tablet        Allergies  Allergen Reactions  . Abilify [Aripiprazole] Other (See Comments)    Motor skills   . Augmentin [Amoxicillin-Pot Clavulanate] Nausea And Vomiting and Diarrhea  . Bactrim [Sulfamethoxazole-Trimethoprim] Diarrhea  . Ceftin [Cefuroxime Axetil] Diarrhea  . Diclofenac     Other reaction(s): Muscle Pain  . Indocin [Indomethacin] Other (See Comments)    Migraines   . Lisinopril Diarrhea and Other (See Comments)  . Morphine And Related Other (See Comments)    Chest tightness   . Penicillins Itching  . Simvastatin Other (See Comments)  . Wellbutrin [Bupropion] Other (See Comments)    Slurred speech  . Quetiapine Fumarate Palpitations   Follow-up Information    Follow up with  [name], MD In 1 week.   Contact information:   [address] [place] [address] [phone]        The results of significant diagnostics from this hospitalization (including imaging, microbiology, ancillary and laboratory) are listed below for reference.    Significant Diagnostic Studies: Dg Chest 2 View  09/19/2014   CLINICAL DATA:  Shortness of Breath, chest heaviness for past 3 days.  EXAM: CHEST  2 VIEW  COMPARISON:  07/19/2014  FINDINGS: The heart size and mediastinal contours are within normal limits. Both lungs are clear. The visualized skeletal structures are unremarkable.  IMPRESSION: No active cardiopulmonary disease.   Electronically Signed   By: Rolm Baptise M.D.   On: 09/19/2014 16:12   Ct Angio Chest Pe W/cm &/or Wo Cm  09/19/2014   CLINICAL DATA:  Shortness of breath for 3 days, personal  history of pulmonary embolism and deep venous thrombosis.  EXAM: CT ANGIOGRAPHY CHEST WITH CONTRAST  TECHNIQUE: Multidetector CT imaging of the chest was performed using the standard protocol during bolus administration of intravenous contrast. Multiplanar CT image reconstructions and MIPs were obtained to evaluate the vascular anatomy.  CONTRAST:  30mL OMNIPAQUE IOHEXOL 350 MG/ML SOLN  COMPARISON:  06/22/2014  FINDINGS: The lungs are well aerated bilaterally. No focal infiltrate or sizable effusion is seen.  The thoracic aorta is unremarkable. Pulmonary artery demonstrates a normal branching pattern. Multiple filling defects are noted involving the left upper lobe, right lower lobe and right middle lobe pulmonary artery branches. No right heart strain is identified. No significant hilar or mediastinal adenopathy is seen.  The visualized upper abdomen is within normal limits. The osseous structures show no acute abnormality. Postsurgical changes in the cervical spine are noted.  Review of the MIP images confirms the above findings.  IMPRESSION: Bilateral small pulmonary  emboli without evidence of right heart strain these are new from the prior exam.  No other focal abnormality is noted.   Electronically Signed   By: Inez Catalina M.D.   On: 09/19/2014 18:08   Ct Hip Right Wo Contrast  09/21/2014   CLINICAL DATA:  Right hip pain.  EXAM: CT OF THE RIGHT HIP WITHOUT CONTRAST  TECHNIQUE: Multidetector CT imaging of the right hip was performed according to the standard protocol. Multiplanar CT image reconstructions were also generated.  COMPARISON:  Radiographs dated 09/20/2014 and CT scan dated 06/22/2014  FINDINGS: There is no fracture or dislocation. There is moderate osteoarthritis with degenerative osteophytes in the acetabulum and femoral head. The appearance is essentially unchanged since the prior CT scan 06/22/2014. There are calcifications in the distal gluteal tendons at the insertions on the superior aspect of the greater trochanter, also unchanged. There are enthesophytes at the inferior aspect of the right greater trochanter which is slightly more prominent. There is no appreciable joint effusion or appreciable bursitis.  No adenopathy or mass lesions.  IMPRESSION: No acute abnormality of the right hip. Moderate osteoarthritic changes with chronic degenerative or posttraumatic changes of the distal gluteal tendons.   Electronically Signed   By: Rozetta Nunnery M.D.   On: 09/21/2014 13:45   Dg Hip Unilat With Pelvis 2-3 Views Right  09/20/2014   CLINICAL DATA:  59 year old female with a history of right hip pain for 1 month. No known injury.  EXAM: DG HIP W/ PELVIS 2-3V*R*  COMPARISON:  CT dated 06/22/2014  FINDINGS: Bony pelvic ring appears intact with no acute bony abnormality.  Right hip appears congruent into views, with degenerative changes of the right hip. Heterotopic ossification superior to the greater trochanter.  Surgical changes of left total hip arthroplasty or incompletely imaged.  Moderate stool burden.  No acute fracture line identified.  IMPRESSION: No  acute bony abnormality identified.  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  Incompletely visualized left hip total arthroplasty.  Signed,  Dulcy Fanny. Earleen Newport, DO  [facility]  [facility]   Electronically Signed   By: Corrie Mckusick D.O.   On: 09/20/2014 18:33    Microbiology: Recent Results (from the past 240 hour(s))  MRSA PCR Screening     Status: None   Collection Time: 09/19/14 11:50 PM  Result Value Ref Range Status   MRSA by PCR NEGATIVE NEGATIVE Final    Comment:        The GeneXpert MRSA Assay (FDA approved for NASAL  specimens only), is one component of a comprehensive MRSA colonization surveillance program. It is not intended to diagnose MRSA infection nor to guide or monitor treatment for MRSA infections.      Labs: Basic Metabolic Panel:  Recent Labs Lab 09/19/14 1511 09/20/14 0911 09/23/14 0556  NA 141 141 139  K 4.3 3.9 4.1  CL 105 105 103  CO2 28 26 29   GLUCOSE 143* 128* 104*  BUN 14 10 11   CREATININE 0.88 0.75 0.87  CALCIUM 9.3 9.0 9.3   Liver Function Tests:  Recent Labs Lab 09/20/14 0911  AST 22  ALT 20  ALKPHOS 90  BILITOT 0.6  PROT 6.3  ALBUMIN 3.3*   No results for input(s): LIPASE, AMYLASE in the last 168 hours. No results for input(s): AMMONIA in the last 168 hours. CBC:  Recent Labs Lab 09/20/14 0911 09/21/14 0508 09/22/14 0500 09/23/14 0556 09/24/14 0302  WBC 7.5 6.4 6.3 6.7 7.3  HGB 10.6* 10.5* 10.5* 11.1* 10.6*  HCT 32.5* 32.0* 32.7* 34.4* 33.0*  MCV 88.6 91.2 91.9 89.8 90.4  PLT 274 253 288 288 290   Cardiac Enzymes:  Recent Labs Lab 09/19/14 2201 09/20/14 0327 09/20/14 0911  TROPONINI <0.03 <0.03 <0.03   BNP: BNP (last 3 results) No results for input(s): PROBNP in the last 8760 hours. CBG:  Recent Labs Lab 09/23/14 1135 09/23/14 1636 09/23/14 2105 09/24/14 0524 09/24/14 1116  GLUCAP 148* 123* 136* 104* 103*       Signed:  Jocob Dambach  [facility] 09/25/2014, 11:44 AM

## 2014-09-27 DIAGNOSIS — I2699 Other pulmonary embolism without acute cor pulmonale: Secondary | ICD-10-CM | POA: Diagnosis not present

## 2014-09-27 DIAGNOSIS — I10 Essential (primary) hypertension: Secondary | ICD-10-CM | POA: Diagnosis not present

## 2014-09-27 DIAGNOSIS — Z86718 Personal history of other venous thrombosis and embolism: Secondary | ICD-10-CM | POA: Diagnosis not present

## 2014-09-27 DIAGNOSIS — E119 Type 2 diabetes mellitus without complications: Secondary | ICD-10-CM | POA: Diagnosis not present

## 2014-10-06 DIAGNOSIS — Z86718 Personal history of other venous thrombosis and embolism: Secondary | ICD-10-CM | POA: Diagnosis not present

## 2014-10-06 DIAGNOSIS — I2699 Other pulmonary embolism without acute cor pulmonale: Secondary | ICD-10-CM | POA: Diagnosis not present

## 2014-10-09 DIAGNOSIS — Z7901 Long term (current) use of anticoagulants: Secondary | ICD-10-CM | POA: Diagnosis not present

## 2014-10-09 DIAGNOSIS — K219 Gastro-esophageal reflux disease without esophagitis: Secondary | ICD-10-CM | POA: Diagnosis not present

## 2014-10-09 DIAGNOSIS — K589 Irritable bowel syndrome without diarrhea: Secondary | ICD-10-CM | POA: Diagnosis not present

## 2014-10-09 DIAGNOSIS — R0789 Other chest pain: Secondary | ICD-10-CM | POA: Diagnosis not present

## 2014-10-09 DIAGNOSIS — R1010 Upper abdominal pain, unspecified: Secondary | ICD-10-CM | POA: Diagnosis not present

## 2014-10-09 DIAGNOSIS — Z86711 Personal history of pulmonary embolism: Secondary | ICD-10-CM | POA: Diagnosis not present

## 2014-10-09 DIAGNOSIS — Z87891 Personal history of nicotine dependence: Secondary | ICD-10-CM | POA: Diagnosis not present

## 2014-10-09 DIAGNOSIS — I10 Essential (primary) hypertension: Secondary | ICD-10-CM | POA: Diagnosis not present

## 2014-10-09 DIAGNOSIS — G894 Chronic pain syndrome: Secondary | ICD-10-CM | POA: Diagnosis not present

## 2014-10-09 DIAGNOSIS — J90 Pleural effusion, not elsewhere classified: Secondary | ICD-10-CM | POA: Diagnosis not present

## 2014-10-10 DIAGNOSIS — R0689 Other abnormalities of breathing: Secondary | ICD-10-CM | POA: Diagnosis not present

## 2014-10-10 DIAGNOSIS — I10 Essential (primary) hypertension: Secondary | ICD-10-CM | POA: Diagnosis not present

## 2014-10-10 DIAGNOSIS — K589 Irritable bowel syndrome without diarrhea: Secondary | ICD-10-CM | POA: Diagnosis not present

## 2014-10-10 DIAGNOSIS — R0789 Other chest pain: Secondary | ICD-10-CM | POA: Diagnosis not present

## 2014-10-10 DIAGNOSIS — R079 Chest pain, unspecified: Secondary | ICD-10-CM | POA: Diagnosis not present

## 2014-10-10 DIAGNOSIS — K219 Gastro-esophageal reflux disease without esophagitis: Secondary | ICD-10-CM | POA: Diagnosis not present

## 2014-10-11 DIAGNOSIS — K219 Gastro-esophageal reflux disease without esophagitis: Secondary | ICD-10-CM | POA: Diagnosis not present

## 2014-10-11 DIAGNOSIS — R079 Chest pain, unspecified: Secondary | ICD-10-CM | POA: Diagnosis not present

## 2014-10-11 DIAGNOSIS — K589 Irritable bowel syndrome without diarrhea: Secondary | ICD-10-CM | POA: Diagnosis not present

## 2014-10-11 DIAGNOSIS — I10 Essential (primary) hypertension: Secondary | ICD-10-CM | POA: Diagnosis not present

## 2014-10-12 ENCOUNTER — Ambulatory Visit: Payer: Self-pay | Admitting: Family Medicine

## 2014-10-12 DIAGNOSIS — F17200 Nicotine dependence, unspecified, uncomplicated: Secondary | ICD-10-CM | POA: Diagnosis not present

## 2014-10-12 DIAGNOSIS — I10 Essential (primary) hypertension: Secondary | ICD-10-CM | POA: Diagnosis not present

## 2014-10-12 DIAGNOSIS — J811 Chronic pulmonary edema: Secondary | ICD-10-CM | POA: Diagnosis not present

## 2014-10-12 DIAGNOSIS — J810 Acute pulmonary edema: Secondary | ICD-10-CM | POA: Diagnosis not present

## 2014-10-12 DIAGNOSIS — Z09 Encounter for follow-up examination after completed treatment for conditions other than malignant neoplasm: Secondary | ICD-10-CM | POA: Diagnosis not present

## 2014-10-12 DIAGNOSIS — I2699 Other pulmonary embolism without acute cor pulmonale: Secondary | ICD-10-CM | POA: Diagnosis not present

## 2014-10-22 ENCOUNTER — Encounter (HOSPITAL_COMMUNITY): Payer: Self-pay | Admitting: Emergency Medicine

## 2014-10-22 ENCOUNTER — Emergency Department (HOSPITAL_COMMUNITY): Payer: Medicare Other

## 2014-10-22 ENCOUNTER — Emergency Department (HOSPITAL_COMMUNITY)
Admission: EM | Admit: 2014-10-22 | Discharge: 2014-10-22 | Disposition: A | Discharge status: Home / Self Care | Payer: Medicare Other | Attending: Emergency Medicine | Admitting: Emergency Medicine

## 2014-10-22 DIAGNOSIS — Z86711 Personal history of pulmonary embolism: Secondary | ICD-10-CM | POA: Diagnosis not present

## 2014-10-22 DIAGNOSIS — Z86718 Personal history of other venous thrombosis and embolism: Secondary | ICD-10-CM | POA: Diagnosis not present

## 2014-10-22 DIAGNOSIS — R42 Dizziness and giddiness: Secondary | ICD-10-CM | POA: Diagnosis not present

## 2014-10-22 DIAGNOSIS — Z862 Personal history of diseases of the blood and blood-forming organs and certain disorders involving the immune mechanism: Secondary | ICD-10-CM | POA: Insufficient documentation

## 2014-10-22 DIAGNOSIS — Z880 Allergy status to penicillin: Secondary | ICD-10-CM | POA: Insufficient documentation

## 2014-10-22 DIAGNOSIS — Z7901 Long term (current) use of anticoagulants: Secondary | ICD-10-CM | POA: Insufficient documentation

## 2014-10-22 DIAGNOSIS — I252 Old myocardial infarction: Secondary | ICD-10-CM | POA: Diagnosis not present

## 2014-10-22 DIAGNOSIS — Z9889 Other specified postprocedural states: Secondary | ICD-10-CM | POA: Diagnosis not present

## 2014-10-22 DIAGNOSIS — I10 Essential (primary) hypertension: Secondary | ICD-10-CM | POA: Diagnosis not present

## 2014-10-22 DIAGNOSIS — G8929 Other chronic pain: Secondary | ICD-10-CM | POA: Diagnosis not present

## 2014-10-22 DIAGNOSIS — Z8719 Personal history of other diseases of the digestive system: Secondary | ICD-10-CM | POA: Diagnosis not present

## 2014-10-22 DIAGNOSIS — Z8739 Personal history of other diseases of the musculoskeletal system and connective tissue: Secondary | ICD-10-CM | POA: Diagnosis not present

## 2014-10-22 DIAGNOSIS — Z79899 Other long term (current) drug therapy: Secondary | ICD-10-CM | POA: Insufficient documentation

## 2014-10-22 DIAGNOSIS — H5509 Other forms of nystagmus: Secondary | ICD-10-CM | POA: Diagnosis not present

## 2014-10-22 DIAGNOSIS — Z8701 Personal history of pneumonia (recurrent): Secondary | ICD-10-CM | POA: Diagnosis not present

## 2014-10-22 DIAGNOSIS — E119 Type 2 diabetes mellitus without complications: Secondary | ICD-10-CM | POA: Insufficient documentation

## 2014-10-22 DIAGNOSIS — Z7902 Long term (current) use of antithrombotics/antiplatelets: Secondary | ICD-10-CM | POA: Insufficient documentation

## 2014-10-22 DIAGNOSIS — G43909 Migraine, unspecified, not intractable, without status migrainosus: Secondary | ICD-10-CM | POA: Insufficient documentation

## 2014-10-22 DIAGNOSIS — Z87891 Personal history of nicotine dependence: Secondary | ICD-10-CM | POA: Diagnosis not present

## 2014-10-22 DIAGNOSIS — R110 Nausea: Secondary | ICD-10-CM | POA: Insufficient documentation

## 2014-10-22 LAB — CBC WITH DIFFERENTIAL/PLATELET
Basophils Absolute: 0 10*3/uL (ref 0.0–0.1)
Basophils Relative: 0 % (ref 0–1)
Eosinophils Absolute: 0.1 10*3/uL (ref 0.0–0.7)
Eosinophils Relative: 1 % (ref 0–5)
HCT: 33.3 % — ABNORMAL LOW (ref 36.0–46.0)
Hemoglobin: 11 g/dL — ABNORMAL LOW (ref 12.0–15.0)
Lymphocytes Relative: 24 % (ref 12–46)
Lymphs Abs: 1.8 10*3/uL (ref 0.7–4.0)
MCH: 29.2 pg (ref 26.0–34.0)
MCHC: 33 g/dL (ref 30.0–36.0)
MCV: 88.3 fL (ref 78.0–100.0)
Monocytes Absolute: 0.4 10*3/uL (ref 0.1–1.0)
Monocytes Relative: 5 % (ref 3–12)
Neutro Abs: 5.2 10*3/uL (ref 1.7–7.7)
Neutrophils Relative %: 70 % (ref 43–77)
Platelets: 373 10*3/uL (ref 150–400)
RBC: 3.77 MIL/uL — ABNORMAL LOW (ref 3.87–5.11)
RDW: 14.4 % (ref 11.5–15.5)
WBC: 7.5 10*3/uL (ref 4.0–10.5)

## 2014-10-22 LAB — CBG MONITORING, ED: Glucose-Capillary: 71 mg/dL (ref 70–99)

## 2014-10-22 LAB — URINALYSIS, ROUTINE W REFLEX MICROSCOPIC
Bilirubin Urine: NEGATIVE
Glucose, UA: NEGATIVE mg/dL
Hgb urine dipstick: NEGATIVE
Ketones, ur: NEGATIVE mg/dL
Leukocytes, UA: NEGATIVE
Nitrite: NEGATIVE
Protein, ur: NEGATIVE mg/dL
Specific Gravity, Urine: 1.005 (ref 1.005–1.030)
Urobilinogen, UA: 0.2 mg/dL (ref 0.0–1.0)
pH: 7.5 (ref 5.0–8.0)

## 2014-10-22 LAB — COMPREHENSIVE METABOLIC PANEL
ALT: 40 U/L — ABNORMAL HIGH (ref 0–35)
AST: 26 U/L (ref 0–37)
Albumin: 3.7 g/dL (ref 3.5–5.2)
Alkaline Phosphatase: 135 U/L — ABNORMAL HIGH (ref 39–117)
Anion gap: 7 (ref 5–15)
BUN: 7 mg/dL (ref 6–23)
CO2: 29 mmol/L (ref 19–32)
Calcium: 9.5 mg/dL (ref 8.4–10.5)
Chloride: 104 mmol/L (ref 96–112)
Creatinine, Ser: 0.8 mg/dL (ref 0.50–1.10)
GFR calc Af Amer: >90 mL/min (ref >90)
GFR calc non Af Amer: 79 mL/min — ABNORMAL LOW (ref >90)
Glucose, Bld: 91 mg/dL (ref 70–99)
Potassium: 3.5 mmol/L (ref 3.5–5.1)
Sodium: 140 mmol/L (ref 135–145)
Total Bilirubin: 0.5 mg/dL (ref 0.3–1.2)
Total Protein: 7.2 g/dL (ref 6.0–8.3)

## 2014-10-22 LAB — I-STAT TROPONIN, ED: Troponin i, poc: 0 ng/mL (ref 0.00–0.08)

## 2014-10-22 LAB — PROTIME-INR
INR: 1.11 (ref 0.00–1.49)
Prothrombin Time: 14.5 seconds (ref 11.6–15.2)

## 2014-10-22 MED ORDER — ONDANSETRON HCL 4 MG PO TABS: 4.0000 mg | ORAL_TABLET | Freq: Four times a day (QID) | ORAL | Status: DC

## 2014-10-22 MED ORDER — SODIUM CHLORIDE 0.9 % IV BOLUS (SEPSIS)
1000.0000 mL | Freq: Once | INTRAVENOUS | Status: AC
  Administered 2014-10-22: 1000 mL via INTRAVENOUS

## 2014-10-22 MED ORDER — MECLIZINE HCL 25 MG PO TABS: 25.0000 mg | ORAL_TABLET | Freq: Once | ORAL | Status: DC

## 2014-10-22 MED ORDER — ONDANSETRON HCL 4 MG/2ML IJ SOLN
4.0000 mg | Freq: Once | INTRAMUSCULAR | Status: AC
  Administered 2014-10-22: 4 mg via INTRAVENOUS
  Filled 2014-10-22: qty 2

## 2014-10-22 MED ORDER — ONDANSETRON HCL 4 MG/2ML IJ SOLN: 4.0000 mg | Freq: Once | INTRAMUSCULAR | Status: DC

## 2014-10-22 MED ORDER — LORAZEPAM 1 MG PO TABS
1.0000 mg | ORAL_TABLET | Freq: Once | ORAL | Status: AC
  Administered 2014-10-22: 1 mg via ORAL
  Filled 2014-10-22: qty 1

## 2014-10-22 MED ORDER — MECLIZINE HCL 25 MG PO TABS
25.0000 mg | ORAL_TABLET | Freq: Once | ORAL | Status: AC
  Administered 2014-10-22: 25 mg via ORAL
  Filled 2014-10-22: qty 1

## 2014-10-22 NOTE — ED Notes (Signed)
CBG 71.  

## 2014-10-22 NOTE — ED Notes (Signed)
Patient transported to X-ray 

## 2014-10-22 NOTE — Discharge Instructions (Signed)

## 2014-10-22 NOTE — Progress Notes (Signed)
CSW met with this 59 y/o, African-American, female who presents in hospital garb, affect was pained, mood reported as, "not good" along side her significant other.  Patient and significant other state they were living in a trailer till January and put in a notice to leave after their lease expired due to constantly rising admin and water costs.  They now reside in a shelter in [place], [place] but have to leave during the day and have no where to go.  The patient states she recently had blood clots on her lungs and it has been difficult for her to recover since she can not lay down in shelter.  Patient states she cannot afford to live in a hotel.  Patient states that she they will get disability checks at the beginning of March and have a place in mind they will move into in approximately two weeks.  Patient is requesting resources for somewhere to live until then.    CSW gave patient various homeless resources including the [employer], other emergency shelters, and other [facility] programs.  CSW explained the procedures for an ALF or SNF.  At this time the patient is not being reccommended for either by the Attending.  Patients significant other took the resources and states he will look into them.  They have an appointment on Tuesday the 23rd with [facility], an outpatient [facility] provider.  [facility] [place] [facility] Richardo Priest ED CSW [phone]

## 2014-10-22 NOTE — ED Notes (Signed)
Nauseated for 3-4 days; unable to eat. No vomiting yet. Also feeling dizzy and has a headache. Symptoms started at the same time as she started new medications (Eliquist and Metoprolol). Has not taken these since last night.

## 2014-10-22 NOTE — ED Notes (Signed)
Patient given Zofran; she wants to wait to take Meclizine pill as nauseated at this time. Notified her of need for urine.

## 2014-10-22 NOTE — Care Management (Signed)
ED CM received consult from [facility] on [address] regarding an issue with homelessness.  Consult was placed for ED CSW.

## 2014-10-22 NOTE — ED Notes (Signed)
Patient returned from X-ray 

## 2014-10-22 NOTE — ED Provider Notes (Signed)
CSN: [id number]     Arrival date & time 10/22/14  6599 History   First MD Initiated Contact with Patient 10/22/14 1015     Chief Complaint  Patient presents with  . Nausea     (Consider location/radiation/quality/duration/timing/severity/associated sxs/prior Treatment) HPI Valerie Vazquez is a 59 y.o. female with a history of hypertension, DVT, recurrent PE with IVC filter comes in for evaluation of nausea and dizziness. Currently taking Eliquis. Patient reports that she was recently discharged from [facility] about a week ago for "fluid on my lungs". She followed up with her PCP who prescribed her furosemide. Patient states for the past few days she has not been taking her metformin, Protonix and furosemide because she felt like they are not working. Patient reports for the past 3 or 4 days she has felt dizzy every time she lays down in the bed. She characterizes this sensation as a "slow spinning". She reports getting up and walking around will often relieve this discomfort. She has not tried anything else. She reports feeling nauseous and sick on her stomach without emesis, abdominal pain, diarrhea or constipation. She also reports having an intermittent headache similar to previous headaches she has had in the past. She denies any headache at this time. She denies changes in vision, chest pain, shortness of breath, syncope, new rashes.  Past Medical History  Diagnosis Date  . Spinal stenosis   . Hypertension   . Pulmonary embolism 04/2001; 09/19/2014    "after gallbladder OR; "  . High cholesterol   . Myocardial infarction 10/2010 X 3    "while hospitalized"  . Pneumonia 04/2014  . Sleep apnea     "mild" (09/19/2014)  . Type II diabetes mellitus     "dx'd in 2007; lost weight; no RX for ~ 4 yr now" (09/19/2014)  . Anemia   . History of blood transfusion 1985    related to hysterectomy  . Esophageal spasm   . Gastritis   . Gastroenteritis   . IBS (irritable bowel syndrome)    . GERD (gastroesophageal reflux disease)   . Headache     "couple /month lately" (09/19/2014)  . Migraine     "a few times/yr" (09/19/2014)  . Osteoarthritis     "qwhere; mostly around my joints" (09/19/2014)  . Spondylosis   . Chronic back pain     "upper and lower" (09/19/2014)  . Depression     "major" (09/19/2014)  . PTSD (post-traumatic stress disorder)   . Schizoaffective disorder    Past Surgical History  Procedure Laterality Date  . Excision/release bursa hip Right 12/1984  . Total hip arthroplasty Left 04-06-2014  . Tonsillectomy and adenoidectomy  ~ 1968  . Appendectomy  1985  . Abdominal hysterectomy  1985  . Cholecystectomy  2002  . Hernia repair  1985  . Umbilical hernia repair  1985  . Vena cava filter placement  03/2014  . Back surgery    . Anterior cervical decomp/discectomy fusion  10/2012  . Breast cyst excision Right   . Cardiac catheterization   2000's; 2009   History reviewed. No pertinent family history. History  Substance Use Topics  . Smoking status: Former Smoker -- 1.50 packs/day for 10 years    Types: Cigarettes    Quit date: 04/12/1979  . Smokeless tobacco: Never Used  . Alcohol Use: Yes     Comment: 09/19/2014 "nothing in the last 6 months"   OB History    No data available  Review of Systems A 10 point review of systems was completed and was negative except for pertinent positives and negatives as mentioned in the history of present illness     Allergies  Abilify; Augmentin; Bactrim; Ceftin; Diclofenac; Indocin; Lisinopril; Morphine and related; Penicillins; Simvastatin; Wellbutrin; and Quetiapine fumarate  Home Medications   Prior to Admission medications   Medication Sig Start Date End Date Taking? Authorizing Provider  apixaban (ELIQUIS) 5 MG TABS tablet Take 5 mg by mouth 2 (two) times daily.   Yes Historical Provider, MD  gabapentin (NEURONTIN) 600 MG tablet Take 600 mg by mouth 3 (three) times daily.   Yes Historical Provider,  MD  loratadine (CLARITIN) 10 MG tablet Take 10 mg by mouth daily.   Yes Historical Provider, MD  Menthol-Camphor (ICY HOT ARTHRITIS PAIN RELIEF) 16-4 % LOTN Apply 1 application topically daily as needed (for pain).   Yes Historical Provider, MD  metoprolol tartrate (LOPRESSOR) 25 MG tablet Take 25 mg by mouth 2 (two) times daily.   Yes Historical Provider, MD  traZODone (DESYREL) 100 MG tablet Take 100 mg by mouth at bedtime.    Yes Historical Provider, MD  enoxaparin (LOVENOX) 150 MG/ML injection Inject 0.91 mLs (135 mg total) into the skin daily. Patient not taking: Reported on 10/22/2014 09/24/14   Hosie Poisson, MD  meclizine (ANTIVERT) 25 MG tablet Take 1 tablet (25 mg total) by mouth once. 10/22/14   Verl Dicker, PA-C  metFORMIN (GLUCOPHAGE) 500 MG tablet Take 1 tablet (500 mg total) by mouth daily with breakfast. Patient not taking: Reported on 10/22/2014 09/24/14   Hosie Poisson, MD  ondansetron (ZOFRAN) 4 MG tablet Take 1 tablet (4 mg total) by mouth every 6 (six) hours. 10/22/14   Verl Dicker, PA-C  warfarin (COUMADIN) 10 MG tablet Take 1 tablet (10 mg total) by mouth daily at 6 PM. Patient not taking: Reported on 10/22/2014 09/24/14   Hosie Poisson, MD   BP 155/78 mmHg  Pulse 88  Temp(Src) 98.2 F (36.8 C) (Oral)  Resp 19  Ht 5\' 6"  (1.676 m)  Wt 205 lb (92.987 kg)  BMI 33.10 kg/m2  SpO2 98% Physical Exam  Constitutional: She is oriented to person, place, and time. She appears well-developed and well-nourished.  HENT:  Head: Normocephalic and atraumatic.  Mouth/Throat: Oropharynx is clear and moist.  Eyes: Conjunctivae are normal. Pupils are equal, round, and reactive to light. Right eye exhibits no discharge. Left eye exhibits no discharge. No scleral icterus.  Extraocular movements intact with some horizontal nystagmus with right lateral gaze. No vertical or rotational nystagmus.  Neck: Neck supple.  Cardiovascular: Normal rate, regular rhythm and normal heart sounds.    Pulmonary/Chest: Effort normal and breath sounds normal. No respiratory distress. She has no wheezes. She has no rales.  Abdominal: Soft. There is no tenderness.  Musculoskeletal: She exhibits no tenderness.  Neurological: She is alert and oriented to person, place, and time.  Cranial Nerves II-XII grossly intact. Motor and sensation 5/5 in all 4 extremities. Completes finger to nose and heel to shin coordination movements without difficulty. Is acting appropriately and at her baseline per her fianc he was in the room. Laying the head of the bed down flat and moving patients head from side-to-side reproduces and exacerbates this spinning sensation. Resolves when he returned upright.  Skin: Skin is warm and dry. No rash noted.  Psychiatric: She has a normal mood and affect.  Nursing note and vitals reviewed.   ED Course  Procedures (including critical care time) Labs Review Labs Reviewed  COMPREHENSIVE METABOLIC PANEL - Abnormal; Notable for the following:    ALT 40 (*)    Alkaline Phosphatase 135 (*)    GFR calc non Af Amer 79 (*)    All other components within normal limits  CBC WITH DIFFERENTIAL/PLATELET - Abnormal; Notable for the following:    RBC 3.77 (*)    Hemoglobin 11.0 (*)    HCT 33.3 (*)    All other components within normal limits  PROTIME-INR  URINALYSIS, ROUTINE W REFLEX MICROSCOPIC  I-STAT TROPOININ, ED  CBG MONITORING, ED    Imaging Review Dg Chest 2 View  10/22/2014   CLINICAL DATA:  Nausea. History of hypertension, pulmonary embolism and myocardial infarction.  EXAM: CHEST  2 VIEW  COMPARISON:  10/12/2016.  FINDINGS: Normal sized heart. Clear lungs. Cervical spine fixation hardware. Inferior vena cava filter and cholecystectomy clips.  IMPRESSION: No acute abnormality.   Electronically Signed   By: Claudie Revering M.D.   On: 10/22/2014 11:10     EKG Interpretation   Date/Time:  Sunday October 22 2014 11:18:07 EST Ventricular Rate:  80 PR Interval:  153 QRS  Duration: 89 QT Interval:  387 QTC Calculation: 446 R Axis:   30 Text Interpretation:  Sinus rhythm Probable left atrial enlargement No  significant change was found Confirmed by CAMPOS  MD, KEVIN ([id number]) on  10/22/2014 11:27:10 AM     Meds given in ED:  Medications  ondansetron (ZOFRAN) injection 4 mg (4 mg Intravenous Given 10/22/14 1118)  meclizine (ANTIVERT) tablet 25 mg (25 mg Oral Given 10/22/14 1138)  sodium chloride 0.9 % bolus 1,000 mL (0 mLs Intravenous Stopped 10/22/14 1200)  LORazepam (ATIVAN) tablet 1 mg (1 mg Oral Given 10/22/14 1302)  sodium chloride 0.9 % bolus 1,000 mL (1,000 mLs Intravenous New Bag/Given 10/22/14 1302)    New Prescriptions   MECLIZINE (ANTIVERT) 25 MG TABLET    Take 1 tablet (25 mg total) by mouth once.   ONDANSETRON (ZOFRAN) 4 MG TABLET    Take 1 tablet (4 mg total) by mouth every 6 (six) hours.   Filed Vitals:   10/22/14 0949 10/22/14 1115 10/22/14 1200 10/22/14 1300  BP: 162/90 153/80 151/68 155/78  Pulse: 84 82 80 88  Temp: 98.2 F (36.8 C)     TempSrc: Oral     Resp: 16 16 25 19   Height: 5\' 6"  (1.676 m)     Weight: 205 lb (92.987 kg)     SpO2: 100% 100% 100% 98%    MDM  Vitals stable - WNL -afebrile Pt resting comfortably in ED. nausea has improved with medications administered in ED. No active vomiting in ED. Tolerating by mouth PE--not concerning for acute central pathology. Most consistent with vertiginous process. Normal neuro exam. Benign abdominal exam Labwork noncontributory. Nonspecific elevated alkaline phosphatase and ALT. Imaging-chest x-ray shows no acute cardio pulmonary pathology  DDX patient symptoms likely multifactorial with some degree of dehydration as she has not been eating or drinking as much as she normally does and is also experiencing symptoms similar to vertigo. Low concern for a central pathology at this time. Doubt CVA, SAH or other ICH. Will DC with meclizine, Zofran. I discussed all relevant lab findings and  imaging results with pt and they verbalized understanding. Discussed f/u with PCP within 48 hrs and return precautions, pt very amenable to plan.  Prior to patient discharge, I discussed and reviewed this case with Dr. Venora Maples, who  also saw and evaluated the patient.     Final diagnoses:  Nausea  Dizziness        Valerie Vazquez [place], PA-C 10/22/14 [place], MD 10/22/14 [phone]

## 2014-10-24 DIAGNOSIS — R079 Chest pain, unspecified: Secondary | ICD-10-CM | POA: Diagnosis not present

## 2014-10-24 DIAGNOSIS — D649 Anemia, unspecified: Secondary | ICD-10-CM | POA: Diagnosis not present

## 2014-10-24 DIAGNOSIS — G8929 Other chronic pain: Secondary | ICD-10-CM | POA: Diagnosis not present

## 2014-10-24 DIAGNOSIS — R42 Dizziness and giddiness: Secondary | ICD-10-CM | POA: Diagnosis not present

## 2014-10-24 DIAGNOSIS — I2699 Other pulmonary embolism without acute cor pulmonale: Secondary | ICD-10-CM | POA: Diagnosis not present

## 2014-10-28 ENCOUNTER — Emergency Department (HOSPITAL_COMMUNITY): Payer: Medicare Other

## 2014-10-28 ENCOUNTER — Emergency Department (HOSPITAL_COMMUNITY)
Admission: EM | Admit: 2014-10-28 | Discharge: 2014-10-28 | Disposition: A | Discharge status: Home / Self Care | Payer: Medicare Other | Attending: Emergency Medicine | Admitting: Emergency Medicine

## 2014-10-28 ENCOUNTER — Encounter (HOSPITAL_COMMUNITY): Payer: Self-pay | Admitting: Emergency Medicine

## 2014-10-28 DIAGNOSIS — Z862 Personal history of diseases of the blood and blood-forming organs and certain disorders involving the immune mechanism: Secondary | ICD-10-CM | POA: Insufficient documentation

## 2014-10-28 DIAGNOSIS — R1084 Generalized abdominal pain: Secondary | ICD-10-CM | POA: Diagnosis not present

## 2014-10-28 DIAGNOSIS — K529 Noninfective gastroenteritis and colitis, unspecified: Secondary | ICD-10-CM | POA: Insufficient documentation

## 2014-10-28 DIAGNOSIS — Z87891 Personal history of nicotine dependence: Secondary | ICD-10-CM | POA: Insufficient documentation

## 2014-10-28 DIAGNOSIS — F329 Major depressive disorder, single episode, unspecified: Secondary | ICD-10-CM | POA: Insufficient documentation

## 2014-10-28 DIAGNOSIS — G43909 Migraine, unspecified, not intractable, without status migrainosus: Secondary | ICD-10-CM | POA: Diagnosis not present

## 2014-10-28 DIAGNOSIS — Z79899 Other long term (current) drug therapy: Secondary | ICD-10-CM | POA: Insufficient documentation

## 2014-10-28 DIAGNOSIS — Z86711 Personal history of pulmonary embolism: Secondary | ICD-10-CM | POA: Diagnosis not present

## 2014-10-28 DIAGNOSIS — R110 Nausea: Secondary | ICD-10-CM | POA: Diagnosis not present

## 2014-10-28 DIAGNOSIS — Z90710 Acquired absence of both cervix and uterus: Secondary | ICD-10-CM | POA: Insufficient documentation

## 2014-10-28 DIAGNOSIS — K5900 Constipation, unspecified: Secondary | ICD-10-CM | POA: Diagnosis not present

## 2014-10-28 DIAGNOSIS — R1011 Right upper quadrant pain: Secondary | ICD-10-CM | POA: Diagnosis present

## 2014-10-28 DIAGNOSIS — G8929 Other chronic pain: Secondary | ICD-10-CM | POA: Diagnosis not present

## 2014-10-28 DIAGNOSIS — N8329 Other ovarian cysts: Secondary | ICD-10-CM | POA: Diagnosis not present

## 2014-10-28 DIAGNOSIS — I10 Essential (primary) hypertension: Secondary | ICD-10-CM | POA: Diagnosis not present

## 2014-10-28 DIAGNOSIS — E119 Type 2 diabetes mellitus without complications: Secondary | ICD-10-CM | POA: Insufficient documentation

## 2014-10-28 DIAGNOSIS — Z9089 Acquired absence of other organs: Secondary | ICD-10-CM | POA: Diagnosis not present

## 2014-10-28 DIAGNOSIS — R109 Unspecified abdominal pain: Secondary | ICD-10-CM

## 2014-10-28 DIAGNOSIS — Z9889 Other specified postprocedural states: Secondary | ICD-10-CM | POA: Insufficient documentation

## 2014-10-28 DIAGNOSIS — M1990 Unspecified osteoarthritis, unspecified site: Secondary | ICD-10-CM | POA: Diagnosis not present

## 2014-10-28 DIAGNOSIS — Z8701 Personal history of pneumonia (recurrent): Secondary | ICD-10-CM | POA: Insufficient documentation

## 2014-10-28 DIAGNOSIS — I252 Old myocardial infarction: Secondary | ICD-10-CM | POA: Insufficient documentation

## 2014-10-28 DIAGNOSIS — Z880 Allergy status to penicillin: Secondary | ICD-10-CM | POA: Insufficient documentation

## 2014-10-28 LAB — LIPASE, BLOOD: Lipase: 16 U/L (ref 11–59)

## 2014-10-28 LAB — COMPREHENSIVE METABOLIC PANEL
ALT: 23 U/L (ref 0–35)
AST: 25 U/L (ref 0–37)
Albumin: 3.6 g/dL (ref 3.5–5.2)
Alkaline Phosphatase: 112 U/L (ref 39–117)
Anion gap: 4 — ABNORMAL LOW (ref 5–15)
BUN: 8 mg/dL (ref 6–23)
CO2: 31 mmol/L (ref 19–32)
Calcium: 9.5 mg/dL (ref 8.4–10.5)
Chloride: 105 mmol/L (ref 96–112)
Creatinine, Ser: 0.76 mg/dL (ref 0.50–1.10)
GFR calc Af Amer: >90 mL/min (ref >90)
GFR calc non Af Amer: 90 mL/min — ABNORMAL LOW (ref >90)
Glucose, Bld: 90 mg/dL (ref 70–99)
Potassium: 5.1 mmol/L (ref 3.5–5.1)
Sodium: 140 mmol/L (ref 135–145)
Total Bilirubin: 0.6 mg/dL (ref 0.3–1.2)
Total Protein: 6.6 g/dL (ref 6.0–8.3)

## 2014-10-28 LAB — CBC WITH DIFFERENTIAL/PLATELET
Basophils Absolute: 0 10*3/uL (ref 0.0–0.1)
Basophils Relative: 0 % (ref 0–1)
Eosinophils Absolute: 0.2 10*3/uL (ref 0.0–0.7)
Eosinophils Relative: 2 % (ref 0–5)
HCT: 31.6 % — ABNORMAL LOW (ref 36.0–46.0)
Hemoglobin: 10.4 g/dL — ABNORMAL LOW (ref 12.0–15.0)
Lymphocytes Relative: 30 % (ref 12–46)
Lymphs Abs: 2.2 10*3/uL (ref 0.7–4.0)
MCH: 29.5 pg (ref 26.0–34.0)
MCHC: 32.9 g/dL (ref 30.0–36.0)
MCV: 89.5 fL (ref 78.0–100.0)
Monocytes Absolute: 0.4 10*3/uL (ref 0.1–1.0)
Monocytes Relative: 6 % (ref 3–12)
Neutro Abs: 4.5 10*3/uL (ref 1.7–7.7)
Neutrophils Relative %: 62 % (ref 43–77)
Platelets: 404 10*3/uL — ABNORMAL HIGH (ref 150–400)
RBC: 3.53 MIL/uL — ABNORMAL LOW (ref 3.87–5.11)
RDW: 14 % (ref 11.5–15.5)
WBC: 7.3 10*3/uL (ref 4.0–10.5)

## 2014-10-28 LAB — URINALYSIS, ROUTINE W REFLEX MICROSCOPIC
Bilirubin Urine: NEGATIVE
Glucose, UA: NEGATIVE mg/dL
Hgb urine dipstick: NEGATIVE
Ketones, ur: NEGATIVE mg/dL
Leukocytes, UA: NEGATIVE
Nitrite: NEGATIVE
Protein, ur: NEGATIVE mg/dL
Specific Gravity, Urine: 1.015 (ref 1.005–1.030)
Urobilinogen, UA: 0.2 mg/dL (ref 0.0–1.0)
pH: 7.5 (ref 5.0–8.0)

## 2014-10-28 MED ORDER — BISACODYL 10 MG RE SUPP
10.0000 mg | RECTAL | Status: AC
  Administered 2014-10-28: 10 mg via RECTAL
  Filled 2014-10-28: qty 1

## 2014-10-28 MED ORDER — PROCHLORPERAZINE EDISYLATE 5 MG/ML IJ SOLN
10.0000 mg | Freq: Once | INTRAMUSCULAR | Status: AC
  Administered 2014-10-28: 10 mg via INTRAVENOUS
  Filled 2014-10-28: qty 2

## 2014-10-28 MED ORDER — MORPHINE SULFATE 4 MG/ML IJ SOLN
4.0000 mg | Freq: Once | INTRAMUSCULAR | Status: DC
  Filled 2014-10-28: qty 1

## 2014-10-28 MED ORDER — DIPHENHYDRAMINE HCL 50 MG/ML IJ SOLN
25.0000 mg | Freq: Once | INTRAMUSCULAR | Status: AC
  Administered 2014-10-28: 25 mg via INTRAVENOUS
  Filled 2014-10-28: qty 1

## 2014-10-28 MED ORDER — IOHEXOL 300 MG/ML  SOLN
100.0000 mL | Freq: Once | INTRAMUSCULAR | Status: AC | PRN
  Administered 2014-10-28: 100 mL via INTRAVENOUS

## 2014-10-28 MED ORDER — LACTULOSE 10 GM/15ML PO SOLN
20.0000 g | Freq: Once | ORAL | Status: AC
  Administered 2014-10-28: 20 g via ORAL
  Filled 2014-10-28: qty 30

## 2014-10-28 MED ORDER — LORAZEPAM 2 MG/ML IJ SOLN
1.0000 mg | Freq: Once | INTRAMUSCULAR | Status: AC
  Administered 2014-10-28: 1 mg via INTRAVENOUS
  Filled 2014-10-28: qty 1

## 2014-10-28 MED ORDER — IOHEXOL 300 MG/ML  SOLN
25.0000 mL | Freq: Once | INTRAMUSCULAR | Status: AC | PRN
  Administered 2014-10-28: 25 mL via ORAL

## 2014-10-28 MED ORDER — ONDANSETRON HCL 4 MG/2ML IJ SOLN
4.0000 mg | Freq: Once | INTRAMUSCULAR | Status: AC
  Administered 2014-10-28: 4 mg via INTRAVENOUS
  Filled 2014-10-28: qty 2

## 2014-10-28 MED ORDER — ONDANSETRON HCL 4 MG/2ML IJ SOLN: 4.0000 mg | Freq: Once | INTRAMUSCULAR | Status: DC

## 2014-10-28 MED ORDER — POLYETHYLENE GLYCOL 3350 17 G PO PACK: 17.0000 g | PACK | Freq: Every day | ORAL | Status: DC

## 2014-10-28 NOTE — ED Notes (Signed)
Pt is stable upon d/c and is escorted by staff from ED via wheelchair. Pt verbalizes understanding rt d/c meds and instructions.

## 2014-10-28 NOTE — ED Provider Notes (Signed)
3:48 PM Assumed care of patient at change of shift from  [facility], PA-C.  Patient with hx IBS, acid reflux, chronic pain, hx recent PE on eliquis with IVC filter in place c/o sharp intermittent RUQ pain and sense of fullness.  S/p cholecystectomy remotely.  Seen in ED this week for same complaint.  Labs, urine unremarkable.  Pending CT abd/pelvis.  Anticipate d/c home if negative.    4:06 PM Patient states she is too nauseated to drink contrast.  I have ordered additional zofran and encouraged patient to attempt to drink as much as possible.  Family member notes pt has not eaten in 2 days and has not had a BM in 4-5 days.  Pt states she is passing some flatus.  She is s/p hysterectomy, appendectomy, cholecystectomy, and hernia repair.     CT shows constipation, otherwise unremarkable.  Discussed with patient and family member.  Pt would like to start medication for constipation in ED.  Ordered PO lactulose and PR bisacodyl.  D/C home with miralax PRN.  PCP follow up.  Discussed result, findings, treatment, and follow up  with patient.  Pt given return precautions.  Pt verbalizes understanding and agrees with plan.      Results for orders placed or performed during the hospital encounter of 10/28/14  CBC with Differential  Result Value Ref Range   WBC 7.3 4.0 - 10.5 K/uL   RBC 3.53 (L) 3.87 - 5.11 MIL/uL   Hemoglobin 10.4 (L) 12.0 - 15.0 g/dL   HCT 31.6 (L) 36.0 - 46.0 %   MCV 89.5 78.0 - 100.0 fL   MCH 29.5 26.0 - 34.0 pg   MCHC 32.9 30.0 - 36.0 g/dL   RDW 14.0 11.5 - 15.5 %   Platelets 404 (H) 150 - 400 K/uL   Neutrophils Relative % 62 43 - 77 %   Neutro Abs 4.5 1.7 - 7.7 K/uL   Lymphocytes Relative 30 12 - 46 %   Lymphs Abs 2.2 0.7 - 4.0 K/uL   Monocytes Relative 6 3 - 12 %   Monocytes Absolute 0.4 0.1 - 1.0 K/uL   Eosinophils Relative 2 0 - 5 %   Eosinophils Absolute 0.2 0.0 - 0.7 K/uL   Basophils Relative 0 0 - 1 %   Basophils Absolute 0.0 0.0 - 0.1 K/uL  Comprehensive metabolic  panel  Result Value Ref Range   Sodium 140 135 - 145 mmol/L   Potassium 5.1 3.5 - 5.1 mmol/L   Chloride 105 96 - 112 mmol/L   CO2 31 19 - 32 mmol/L   Glucose, Bld 90 70 - 99 mg/dL   BUN 8 6 - 23 mg/dL   Creatinine, Ser 0.76 0.50 - 1.10 mg/dL   Calcium 9.5 8.4 - 10.5 mg/dL   Total Protein 6.6 6.0 - 8.3 g/dL   Albumin 3.6 3.5 - 5.2 g/dL   AST 25 0 - 37 U/L   ALT 23 0 - 35 U/L   Alkaline Phosphatase 112 39 - 117 U/L   Total Bilirubin 0.6 0.3 - 1.2 mg/dL   GFR calc non Af Amer 90 (L) >90 mL/min   GFR calc Af Amer >90 >90 mL/min   Anion gap 4 (L) 5 - 15  Lipase, blood  Result Value Ref Range   Lipase 16 11 - 59 U/L  Urinalysis, Routine w reflex microscopic  Result Value Ref Range   Color, Urine YELLOW YELLOW   APPearance CLEAR CLEAR   Specific Gravity, Urine 1.015 1.005 -  1.030   pH 7.5 5.0 - 8.0   Glucose, UA NEGATIVE NEGATIVE mg/dL   Hgb urine dipstick NEGATIVE NEGATIVE   Bilirubin Urine NEGATIVE NEGATIVE   Ketones, ur NEGATIVE NEGATIVE mg/dL   Protein, ur NEGATIVE NEGATIVE mg/dL   Urobilinogen, UA 0.2 0.0 - 1.0 mg/dL   Nitrite NEGATIVE NEGATIVE   Leukocytes, UA NEGATIVE NEGATIVE   Dg Chest 2 View  10/22/2014   CLINICAL DATA:  Nausea. History of hypertension, pulmonary embolism and myocardial infarction.  EXAM: CHEST  2 VIEW  COMPARISON:  10/12/2016.  FINDINGS: Normal sized heart. Clear lungs. Cervical spine fixation hardware. Inferior vena cava filter and cholecystectomy clips.  IMPRESSION: No acute abnormality.   Electronically Signed   By: Claudie Revering M.D.   On: 10/22/2014 11:10   Ct Abdomen Pelvis W Contrast  10/28/2014   CLINICAL DATA:  Mid abdominal pain x3-4 days, nausea, constipation  EXAM: CT ABDOMEN AND PELVIS WITH CONTRAST  TECHNIQUE: Multidetector CT imaging of the abdomen and pelvis was performed using the standard protocol following bolus administration of intravenous contrast.  CONTRAST:  178mL OMNIPAQUE IOHEXOL 300 MG/ML  SOLN  COMPARISON:  06/22/2014  FINDINGS:  Lower chest:  Trace right pleural effusion.  Hepatobiliary: Liver is within normal limits. No suspicious/enhancing hepatic lesions.  Status post cholecystectomy. No intrahepatic or extrahepatic ductal dilatation.  Pancreas: Within normal limits.  Spleen: Within normal limits.  Adrenals/Urinary Tract: Adrenal glands are within normal limits.  Kidneys are Within normal limits.  No hydronephrosis.  Bladder is underdistended and partially obscured by streak artifact.  Stomach/Bowel: Stomach is within normal limits.  No evidence of bowel obstruction.  Prior appendectomy.  Moderate right colonic stool burden, suggesting constipation.  Vascular/Lymphatic: Atherosclerotic calcifications of the abdominal aorta and branch vessels.  IVC filter.  No suspicious abdominopelvic lymphadenopathy.  Reproductive: Status post hysterectomy.  2.0 cm left ovarian cyst (series 21/ image 70), unchanged.  Right ovary is within normal limits.  Other: No abdominopelvic ascites.  Musculoskeletal: Visualized thoracolumbar spine is within normal limits.  Left hip arthroplasty in satisfactory position.  IMPRESSION: No evidence of bowel obstruction.  Prior appendectomy.  Moderate right colonic stool burden, suggesting constipation.  Additional stable ancillary findings as above.   Electronically Signed   By: Julian Hy M.D.   On: 10/28/2014 18:05      Clayton Bibles, PA-C 10/28/14 2332  Charlesetta Shanks, MD 11/01/14 [phone]

## 2014-10-28 NOTE — ED Provider Notes (Signed)
CSN: [id number]     Arrival date & time 10/28/14  1047 History   First MD Initiated Contact with Patient 10/28/14 1138     Chief Complaint  Patient presents with  . Nausea  . Abdominal Pain     (Consider location/radiation/quality/duration/timing/severity/associated sxs/prior Treatment) HPI   Valerie Vazquez Is a 59 year old female with past medical history of IBS, gastritis, GERD, diabetes, schizoaffective disorder and PTSD, chronic back pain, migraine headaches, recently diagnosed with pulmonary embolus currently taking liquids with IVC filter in place. The patient was seen for abdominal pain 3 days ago. Patient returns with worsening abdominal pain which she describes as sharp, constant, predominantly epigastric in the right upper quadrant, however, diffuse. She complains of pain to the touch of her abdominal skin. She denies any rashes, lesions. The patient has associated nausea without vomiting. She denies constipation, diarrhea, melena, hematochezia. Patient denies any urinary symptoms. She has decreased appetite. Patient states that her outpatient medications which are Phenergan, Bentyl are not working.  Past Medical History  Diagnosis Date  . Spinal stenosis   . Hypertension   . Pulmonary embolism 04/2001; 09/19/2014    "after gallbladder OR; "  . High cholesterol   . Myocardial infarction 10/2010 X 3    "while hospitalized"  . Pneumonia 04/2014  . Sleep apnea     "mild" (09/19/2014)  . Type II diabetes mellitus     "dx'd in 2007; lost weight; no RX for ~ 4 yr now" (09/19/2014)  . Anemia   . History of blood transfusion 1985    related to hysterectomy  . Esophageal spasm   . Gastritis   . Gastroenteritis   . IBS (irritable bowel syndrome)   . GERD (gastroesophageal reflux disease)   . Headache     "couple /month lately" (09/19/2014)  . Migraine     "a few times/yr" (09/19/2014)  . Osteoarthritis     "qwhere; mostly around my joints" (09/19/2014)  . Spondylosis   .  Chronic back pain     "upper and lower" (09/19/2014)  . Depression     "major" (09/19/2014)  . PTSD (post-traumatic stress disorder)   . Schizoaffective disorder    Past Surgical History  Procedure Laterality Date  . Excision/release bursa hip Right 12/1984  . Total hip arthroplasty Left 04-06-2014  . Tonsillectomy and adenoidectomy  ~ 1968  . Appendectomy  1985  . Abdominal hysterectomy  1985  . Cholecystectomy  2002  . Hernia repair  1985  . Umbilical hernia repair  1985  . Vena cava filter placement  03/2014  . Back surgery    . Anterior cervical decomp/discectomy fusion  10/2012  . Breast cyst excision Right   . Cardiac catheterization   2000's; 2009   No family history on file. History  Substance Use Topics  . Smoking status: Former Smoker -- 1.50 packs/day for 10 years    Types: Cigarettes    Quit date: 04/12/1979  . Smokeless tobacco: Never Used  . Alcohol Use: Yes     Comment: 09/19/2014 "nothing in the last 6 months"   OB History    No data available     Review of Systems Ten systems reviewed and are negative for acute change, except as noted in the HPI.     Allergies  Abilify; Augmentin; Bactrim; Ceftin; Diclofenac; Indocin; Lisinopril; Morphine and related; Penicillins; Simvastatin; Wellbutrin; and Quetiapine fumarate  Home Medications   Prior to Admission medications   Medication Sig Start Date End Date Taking?  Authorizing Provider  apixaban (ELIQUIS) 5 MG TABS tablet Take 5 mg by mouth 2 (two) times daily.   Yes Historical Provider, MD  gabapentin (NEURONTIN) 600 MG tablet Take 600 mg by mouth 3 (three) times daily.   Yes Historical Provider, MD  loratadine (CLARITIN) 10 MG tablet Take 10 mg by mouth daily.   Yes Historical Provider, MD  meclizine (ANTIVERT) 25 MG tablet Take 1 tablet (25 mg total) by mouth once. 10/22/14  Yes Viona Gilmore Cartner, PA-C  Menthol-Camphor (ICY HOT ARTHRITIS PAIN RELIEF) 16-4 % LOTN Apply 1 application topically daily as needed  (for pain).   Yes Historical Provider, MD  metoprolol tartrate (LOPRESSOR) 25 MG tablet Take 25 mg by mouth daily.    Yes Historical Provider, MD  Multiple Vitamin (MULTIVITAMIN WITH MINERALS) TABS tablet Take 1 tablet by mouth daily.   Yes Historical Provider, MD  ondansetron (ZOFRAN) 4 MG tablet Take 1 tablet (4 mg total) by mouth every 6 (six) hours. 10/22/14  Yes Viona Gilmore Cartner, PA-C  traZODone (DESYREL) 100 MG tablet Take 50 mg by mouth at bedtime.    Yes Historical Provider, MD  enoxaparin (LOVENOX) 150 MG/ML injection Inject 0.91 mLs (135 mg total) into the skin daily. Patient not taking: Reported on 10/22/2014 09/24/14   Hosie Poisson, MD  metFORMIN (GLUCOPHAGE) 500 MG tablet Take 1 tablet (500 mg total) by mouth daily with breakfast. Patient not taking: Reported on 10/22/2014 09/24/14   Hosie Poisson, MD  warfarin (COUMADIN) 10 MG tablet Take 1 tablet (10 mg total) by mouth daily at 6 PM. Patient not taking: Reported on 10/22/2014 09/24/14   Hosie Poisson, MD   BP 119/60 mmHg  Pulse 69  Temp(Src) 98.3 F (36.8 C) (Oral)  Resp 14  Ht 5\' 6"  (1.676 m)  Wt 208 lb (94.348 kg)  BMI 33.59 kg/m2  SpO2 99% Physical Exam Physical Exam  Nursing note and vitals reviewed. Constitutional: She is oriented to person, place, and time. She appears well-developed and well-nourished. No distress.  HENT:  Head: Normocephalic and atraumatic.  Eyes: Conjunctivae normal and EOM are normal. Pupils are equal, round, and reactive to light. No scleral icterus.  Neck: Normal range of motion.  Cardiovascular: Normal rate, regular rhythm and normal heart sounds.  Exam reveals no gallop and no friction rub.   No murmur heard. Pulmonary/Chest: Effort normal and breath sounds normal. No respiratory distress.  Abdominal: Soft. Bowel sounds are normal. She exhibits no distension and no mass. Diffuse abdominal tenderness. No CVA tenderness or suprapubic pain  Neurological: She is alert and oriented to person, place,  and time.  Skin: Skin is warm and dry. She is not diaphoretic.    ED Course  Procedures (including critical care time) Labs Review Labs Reviewed  CBC WITH DIFFERENTIAL/PLATELET - Abnormal; Notable for the following:    RBC 3.53 (*)    Hemoglobin 10.4 (*)    HCT 31.6 (*)    Platelets 404 (*)    All other components within normal limits  COMPREHENSIVE METABOLIC PANEL - Abnormal; Notable for the following:    GFR calc non Af Amer 90 (*)    Anion gap 4 (*)    All other components within normal limits  LIPASE, BLOOD  URINALYSIS, ROUTINE W REFLEX MICROSCOPIC    Imaging Review No results found.   EKG Interpretation None      MDM   Final diagnoses:  Abdominal pain  Abdominal pain    Patient with abdominal pain.  Labs  at baseline without marked abnormality.  CT is pending at this time  I have given sign out to [place] who will assume care. Plan to d/c patient to f/u with pcp/ symptomatic treatment if no acute abnormalities found.      Margarita Mail, PA-C 10/28/14 1605  Charlesetta Shanks, MD 11/01/14 [phone]

## 2014-10-28 NOTE — ED Notes (Signed)
Pt. Lives in a homeless shelter at the present.

## 2014-10-28 NOTE — ED Notes (Signed)
Pt. Stated, I ve had the same symptoms nausea and abdominal pain for a week half.

## 2014-10-28 NOTE — Discharge Instructions (Signed)
Read the information below.  Use the prescribed medication as directed.  Please discuss all new medications with your pharmacist.  You may return to the Emergency Department at any time for worsening condition or any new symptoms that concern you.  If you develop high fevers, worsening abdominal pain, uncontrolled vomiting, or are unable to tolerate fluids by mouth, return to the ER for a recheck.    Abdominal Pain Many things can cause abdominal pain. Usually, abdominal pain is not caused by a disease and will improve without treatment. It can often be observed and treated at home. Your health care provider will do a physical exam and possibly order blood tests and X-rays to help determine the seriousness of your pain. However, in many cases, more time must pass before a clear cause of the pain can be found. Before that point, your health care provider may not know if you need more testing or further treatment. HOME CARE INSTRUCTIONS  Monitor your abdominal pain for any changes. The following actions may help to alleviate any discomfort you are experiencing:  Only take over-the-counter or prescription medicines as directed by your health care provider.  Do not take laxatives unless directed to do so by your health care provider.  Try a clear liquid diet (broth, tea, or water) as directed by your health care provider. Slowly move to a bland diet as tolerated. SEEK MEDICAL CARE IF:  You have unexplained abdominal pain.  You have abdominal pain associated with nausea or diarrhea.  You have pain when you urinate or have a bowel movement.  You experience abdominal pain that wakes you in the night.  You have abdominal pain that is worsened or improved by eating food.  You have abdominal pain that is worsened with eating fatty foods.  You have a fever. SEEK IMMEDIATE MEDICAL CARE IF:   Your pain does not go away within 2 hours.  You keep throwing up (vomiting).  Your pain is felt only in  portions of the abdomen, such as the right side or the left lower portion of the abdomen.  You pass bloody or black tarry stools. MAKE SURE YOU:  Understand these instructions.   Will watch your condition.   Will get help right away if you are not doing well or get worse.  Document Released: 05/28/2005 Document Revised: 08/23/2013 Document Reviewed: 04/27/2013 [facility] Patient Information 2015 [place], [place]. This information is not intended to replace advice given to you by your health care provider. Make sure you discuss any questions you have with your health care provider.  Constipation Constipation is when a person has fewer than three bowel movements a week, has difficulty having a bowel movement, or has stools that are dry, hard, or larger than normal. As people grow older, constipation is more common. If you try to fix constipation with medicines that make you have a bowel movement (laxatives), the problem may get worse. Long-term laxative use may cause the muscles of the colon to become weak. A low-fiber diet, not taking in enough fluids, and taking certain medicines may make constipation worse.  CAUSES   Certain medicines, such as antidepressants, pain medicine, iron supplements, antacids, and water pills.   Certain diseases, such as diabetes, irritable bowel syndrome (IBS), thyroid disease, or depression.   Not drinking enough water.   Not eating enough fiber-rich foods.   Stress or travel.   Lack of physical activity or exercise.   Ignoring the urge to have a bowel movement.  Using laxatives too much.  SIGNS AND SYMPTOMS   Having fewer than three bowel movements a week.   Straining to have a bowel movement.   Having stools that are hard, dry, or larger than normal.   Feeling full or bloated.   Pain in the lower abdomen.   Not feeling relief after having a bowel movement.  DIAGNOSIS  Your health care provider will take a medical history and  perform a physical exam. Further testing may be done for severe constipation. Some tests may include:  A barium enema X-ray to examine your rectum, colon, and, sometimes, your small intestine.   A sigmoidoscopy to examine your lower colon.   A colonoscopy to examine your entire colon. TREATMENT  Treatment will depend on the severity of your constipation and what is causing it. Some dietary treatments include drinking more fluids and eating more fiber-rich foods. Lifestyle treatments may include regular exercise. If these diet and lifestyle recommendations do not help, your health care provider may recommend taking over-the-counter laxative medicines to help you have bowel movements. Prescription medicines may be prescribed if over-the-counter medicines do not work.  HOME CARE INSTRUCTIONS   Eat foods that have a lot of fiber, such as fruits, vegetables, whole grains, and beans.  Limit foods high in fat and processed sugars, such as french fries, hamburgers, cookies, candies, and soda.   A fiber supplement may be added to your diet if you cannot get enough fiber from foods.   Drink enough fluids to keep your urine clear or pale yellow.   Exercise regularly or as directed by your health care provider.   Go to the restroom when you have the urge to go. Do not hold it.   Only take over-the-counter or prescription medicines as directed by your health care provider. Do not take other medicines for constipation without talking to your health care provider first.  [place] IF:   You have bright red blood in your stool.   Your constipation lasts for more than 4 days or gets worse.   You have abdominal or rectal pain.   You have thin, pencil-like stools.   You have unexplained weight loss. MAKE SURE YOU:   Understand these instructions.  Will watch your condition.  Will get help right away if you are not doing well or get worse. Document Released:  05/16/2004 Document Revised: 08/23/2013 Document Reviewed: 05/30/2013 [facility] Patient Information 2015 [place], [place]. This information is not intended to replace advice given to you by your health care provider. Make sure you discuss any questions you have with your health care provider.

## 2014-11-06 DIAGNOSIS — R0602 Shortness of breath: Secondary | ICD-10-CM | POA: Diagnosis not present

## 2014-11-06 DIAGNOSIS — R079 Chest pain, unspecified: Secondary | ICD-10-CM | POA: Diagnosis not present

## 2014-11-06 DIAGNOSIS — I10 Essential (primary) hypertension: Secondary | ICD-10-CM | POA: Diagnosis not present

## 2014-11-06 DIAGNOSIS — Z86718 Personal history of other venous thrombosis and embolism: Secondary | ICD-10-CM | POA: Diagnosis not present

## 2014-11-06 DIAGNOSIS — Z8701 Personal history of pneumonia (recurrent): Secondary | ICD-10-CM | POA: Diagnosis not present

## 2014-11-06 DIAGNOSIS — K219 Gastro-esophageal reflux disease without esophagitis: Secondary | ICD-10-CM | POA: Diagnosis not present

## 2014-11-06 DIAGNOSIS — E780 Pure hypercholesterolemia: Secondary | ICD-10-CM | POA: Diagnosis not present

## 2014-12-09 ENCOUNTER — Encounter (HOSPITAL_COMMUNITY)
Admission: EM | Disposition: A | Discharge status: Home / Self Care | Payer: Medicare Other | Source: Home / Self Care | Attending: Cardiovascular Disease

## 2014-12-09 ENCOUNTER — Inpatient Hospital Stay (HOSPITAL_COMMUNITY)
Admission: EM | Admit: 2014-12-09 | Discharge: 2014-12-12 | DRG: 287 | Disposition: A | Discharge status: Home / Self Care | Payer: Medicare Other | Attending: Cardiovascular Disease | Admitting: Cardiovascular Disease

## 2014-12-09 ENCOUNTER — Emergency Department (HOSPITAL_COMMUNITY): Payer: Medicare Other

## 2014-12-09 ENCOUNTER — Encounter (HOSPITAL_COMMUNITY): Payer: Self-pay | Admitting: Emergency Medicine

## 2014-12-09 DIAGNOSIS — K5909 Other constipation: Secondary | ICD-10-CM | POA: Diagnosis present

## 2014-12-09 DIAGNOSIS — K219 Gastro-esophageal reflux disease without esophagitis: Secondary | ICD-10-CM | POA: Diagnosis present

## 2014-12-09 DIAGNOSIS — I10 Essential (primary) hypertension: Secondary | ICD-10-CM | POA: Diagnosis present

## 2014-12-09 DIAGNOSIS — R0789 Other chest pain: Secondary | ICD-10-CM | POA: Diagnosis not present

## 2014-12-09 DIAGNOSIS — Z888 Allergy status to other drugs, medicaments and biological substances status: Secondary | ICD-10-CM

## 2014-12-09 DIAGNOSIS — E785 Hyperlipidemia, unspecified: Secondary | ICD-10-CM | POA: Diagnosis present

## 2014-12-09 DIAGNOSIS — Z87891 Personal history of nicotine dependence: Secondary | ICD-10-CM | POA: Diagnosis not present

## 2014-12-09 DIAGNOSIS — Z7901 Long term (current) use of anticoagulants: Secondary | ICD-10-CM

## 2014-12-09 DIAGNOSIS — I213 ST elevation (STEMI) myocardial infarction of unspecified site: Secondary | ICD-10-CM

## 2014-12-09 DIAGNOSIS — I25700 Atherosclerosis of coronary artery bypass graft(s), unspecified, with unstable angina pectoris: Secondary | ICD-10-CM

## 2014-12-09 DIAGNOSIS — R079 Chest pain, unspecified: Secondary | ICD-10-CM | POA: Diagnosis not present

## 2014-12-09 DIAGNOSIS — I252 Old myocardial infarction: Secondary | ICD-10-CM

## 2014-12-09 DIAGNOSIS — Z880 Allergy status to penicillin: Secondary | ICD-10-CM

## 2014-12-09 DIAGNOSIS — I2119 ST elevation (STEMI) myocardial infarction involving other coronary artery of inferior wall: Secondary | ICD-10-CM | POA: Diagnosis not present

## 2014-12-09 DIAGNOSIS — Z86718 Personal history of other venous thrombosis and embolism: Secondary | ICD-10-CM | POA: Diagnosis not present

## 2014-12-09 DIAGNOSIS — Z885 Allergy status to narcotic agent status: Secondary | ICD-10-CM | POA: Diagnosis not present

## 2014-12-09 DIAGNOSIS — F4310 Post-traumatic stress disorder, unspecified: Secondary | ICD-10-CM | POA: Diagnosis present

## 2014-12-09 DIAGNOSIS — F259 Schizoaffective disorder, unspecified: Secondary | ICD-10-CM | POA: Diagnosis present

## 2014-12-09 DIAGNOSIS — K5900 Constipation, unspecified: Secondary | ICD-10-CM | POA: Diagnosis present

## 2014-12-09 DIAGNOSIS — Z79899 Other long term (current) drug therapy: Secondary | ICD-10-CM | POA: Diagnosis not present

## 2014-12-09 DIAGNOSIS — I201 Angina pectoris with documented spasm: Secondary | ICD-10-CM | POA: Diagnosis not present

## 2014-12-09 DIAGNOSIS — E0859 Diabetes mellitus due to underlying condition with other circulatory complications: Secondary | ICD-10-CM | POA: Diagnosis not present

## 2014-12-09 DIAGNOSIS — R072 Precordial pain: Secondary | ICD-10-CM | POA: Diagnosis not present

## 2014-12-09 DIAGNOSIS — Z86711 Personal history of pulmonary embolism: Secondary | ICD-10-CM

## 2014-12-09 DIAGNOSIS — Z96642 Presence of left artificial hip joint: Secondary | ICD-10-CM | POA: Diagnosis present

## 2014-12-09 DIAGNOSIS — Z881 Allergy status to other antibiotic agents status: Secondary | ICD-10-CM

## 2014-12-09 DIAGNOSIS — Z882 Allergy status to sulfonamides status: Secondary | ICD-10-CM

## 2014-12-09 DIAGNOSIS — E780 Pure hypercholesterolemia: Secondary | ICD-10-CM | POA: Diagnosis present

## 2014-12-09 DIAGNOSIS — E119 Type 2 diabetes mellitus without complications: Secondary | ICD-10-CM | POA: Diagnosis present

## 2014-12-09 DIAGNOSIS — Z9889 Other specified postprocedural states: Secondary | ICD-10-CM

## 2014-12-09 DIAGNOSIS — R9431 Abnormal electrocardiogram [ECG] [EKG]: Secondary | ICD-10-CM | POA: Diagnosis not present

## 2014-12-09 HISTORY — DX: Angina pectoris with documented spasm: I20.1

## 2014-12-09 HISTORY — PX: LEFT HEART CATHETERIZATION WITH CORONARY ANGIOGRAM: SHX5451

## 2014-12-09 HISTORY — DX: Other specified postprocedural states: Z98.890

## 2014-12-09 LAB — HEPATIC FUNCTION PANEL
ALT: 26 U/L (ref 0–35)
AST: 29 U/L (ref 0–37)
Albumin: 3.5 g/dL (ref 3.5–5.2)
Alkaline Phosphatase: 107 U/L (ref 39–117)
Bilirubin, Direct: 0.1 mg/dL (ref 0.0–0.5)
Indirect Bilirubin: 0.2 mg/dL — ABNORMAL LOW (ref 0.3–0.9)
Total Bilirubin: 0.3 mg/dL (ref 0.3–1.2)
Total Protein: 6.4 g/dL (ref 6.0–8.3)

## 2014-12-09 LAB — CBC
HCT: 32.8 % — ABNORMAL LOW (ref 36.0–46.0)
Hemoglobin: 10.6 g/dL — ABNORMAL LOW (ref 12.0–15.0)
MCH: 28.9 pg (ref 26.0–34.0)
MCHC: 32.3 g/dL (ref 30.0–36.0)
MCV: 89.4 fL (ref 78.0–100.0)
Platelets: 378 10*3/uL (ref 150–400)
RBC: 3.67 MIL/uL — ABNORMAL LOW (ref 3.87–5.11)
RDW: 13.9 % (ref 11.5–15.5)
WBC: 6.3 10*3/uL (ref 4.0–10.5)

## 2014-12-09 LAB — BASIC METABOLIC PANEL
Anion gap: 6 (ref 5–15)
BUN: 8 mg/dL (ref 6–23)
CO2: 29 mmol/L (ref 19–32)
Calcium: 9.3 mg/dL (ref 8.4–10.5)
Chloride: 103 mmol/L (ref 96–112)
Creatinine, Ser: 0.95 mg/dL (ref 0.50–1.10)
GFR calc Af Amer: 74 mL/min — ABNORMAL LOW (ref >90)
GFR calc non Af Amer: 64 mL/min — ABNORMAL LOW (ref >90)
Glucose, Bld: 100 mg/dL — ABNORMAL HIGH (ref 70–99)
Potassium: 4.7 mmol/L (ref 3.5–5.1)
Sodium: 138 mmol/L (ref 135–145)

## 2014-12-09 LAB — BRAIN NATRIURETIC PEPTIDE: B Natriuretic Peptide: 90.6 pg/mL (ref 0.0–100.0)

## 2014-12-09 LAB — I-STAT TROPONIN, ED: Troponin i, poc: 0 ng/mL (ref 0.00–0.08)

## 2014-12-09 LAB — D-DIMER, QUANTITATIVE: D-Dimer, Quant: 1.07 ug/mL-FEU — ABNORMAL HIGH (ref 0.00–0.48)

## 2014-12-09 SURGERY — LEFT HEART CATHETERIZATION WITH CORONARY ANGIOGRAM
Anesthesia: LOCAL

## 2014-12-09 MED ORDER — VERAPAMIL HCL 2.5 MG/ML IV SOLN
INTRAVENOUS | Status: AC
  Filled 2014-12-09: qty 2

## 2014-12-09 MED ORDER — ONDANSETRON HCL 4 MG/2ML IJ SOLN
4.0000 mg | Freq: Four times a day (QID) | INTRAMUSCULAR | Status: DC | PRN
  Administered 2014-12-11 (×2): 4 mg via INTRAVENOUS
  Filled 2014-12-09 (×2): qty 2

## 2014-12-09 MED ORDER — APIXABAN 5 MG PO TABS
5.0000 mg | ORAL_TABLET | Freq: Two times a day (BID) | ORAL | Status: DC
  Administered 2014-12-10 – 2014-12-12 (×5): 5 mg via ORAL
  Filled 2014-12-09 (×6): qty 1

## 2014-12-09 MED ORDER — LIDOCAINE HCL (PF) 1 % IJ SOLN
INTRAMUSCULAR | Status: AC
  Filled 2014-12-09: qty 30

## 2014-12-09 MED ORDER — ONDANSETRON HCL 4 MG/2ML IJ SOLN
4.0000 mg | Freq: Once | INTRAMUSCULAR | Status: AC
  Administered 2014-12-09: 4 mg via INTRAVENOUS
  Filled 2014-12-09: qty 2

## 2014-12-09 MED ORDER — ACETAMINOPHEN 325 MG PO TABS: 650.0000 mg | ORAL_TABLET | ORAL | Status: DC | PRN

## 2014-12-09 MED ORDER — METOPROLOL TARTRATE 25 MG PO TABS
25.0000 mg | ORAL_TABLET | Freq: Two times a day (BID) | ORAL | Status: DC
  Administered 2014-12-09 – 2014-12-11 (×5): 25 mg via ORAL
  Filled 2014-12-09 (×7): qty 1

## 2014-12-09 MED ORDER — NITROGLYCERIN 1 MG/10 ML FOR IR/CATH LAB
INTRA_ARTERIAL | Status: AC
  Filled 2014-12-09: qty 10

## 2014-12-09 MED ORDER — DILTIAZEM HCL ER COATED BEADS 120 MG PO CP24
120.0000 mg | ORAL_CAPSULE | Freq: Every day | ORAL | Status: DC
  Administered 2014-12-09 – 2014-12-12 (×4): 120 mg via ORAL
  Filled 2014-12-09 (×4): qty 1

## 2014-12-09 MED ORDER — POLYETHYLENE GLYCOL 3350 17 G PO PACK
17.0000 g | PACK | Freq: Every day | ORAL | Status: DC
  Filled 2014-12-09 (×3): qty 1

## 2014-12-09 MED ORDER — GABAPENTIN 600 MG PO TABS
600.0000 mg | ORAL_TABLET | Freq: Three times a day (TID) | ORAL | Status: DC
  Administered 2014-12-09 – 2014-12-12 (×8): 600 mg via ORAL
  Filled 2014-12-09 (×11): qty 1

## 2014-12-09 MED ORDER — LORATADINE 10 MG PO TABS
10.0000 mg | ORAL_TABLET | Freq: Every day | ORAL | Status: DC
  Administered 2014-12-09 – 2014-12-11 (×3): 10 mg via ORAL
  Filled 2014-12-09 (×4): qty 1

## 2014-12-09 MED ORDER — OXYCODONE-ACETAMINOPHEN 5-325 MG PO TABS
1.0000 | ORAL_TABLET | ORAL | Status: DC | PRN
  Administered 2014-12-09 – 2014-12-11 (×8): 2 via ORAL
  Filled 2014-12-09 (×8): qty 2

## 2014-12-09 MED ORDER — BIVALIRUDIN 250 MG IV SOLR
INTRAVENOUS | Status: AC
  Filled 2014-12-09: qty 250

## 2014-12-09 MED ORDER — HYDROMORPHONE HCL 1 MG/ML IJ SOLN
1.0000 mg | Freq: Once | INTRAMUSCULAR | Status: AC
  Administered 2014-12-09: 1 mg via INTRAVENOUS
  Filled 2014-12-09: qty 1

## 2014-12-09 MED ORDER — HEPARIN SODIUM (PORCINE) 5000 UNIT/ML IJ SOLN
INTRAMUSCULAR | Status: AC
  Filled 2014-12-09: qty 1

## 2014-12-09 MED ORDER — ASPIRIN 81 MG PO CHEW
324.0000 mg | CHEWABLE_TABLET | Freq: Once | ORAL | Status: AC
  Administered 2014-12-09: 324 mg via ORAL
  Filled 2014-12-09: qty 4

## 2014-12-09 MED ORDER — HEPARIN BOLUS VIA INFUSION
4000.0000 [IU] | Freq: Once | INTRAVENOUS | Status: DC
  Filled 2014-12-09: qty 4000

## 2014-12-09 MED ORDER — SODIUM CHLORIDE 0.9 % IV SOLN: INTRAVENOUS | Status: AC

## 2014-12-09 MED ORDER — TRAZODONE HCL 50 MG PO TABS
50.0000 mg | ORAL_TABLET | Freq: Every day | ORAL | Status: DC
  Administered 2014-12-09 – 2014-12-11 (×3): 50 mg via ORAL
  Filled 2014-12-09 (×4): qty 1

## 2014-12-09 MED ORDER — MIDAZOLAM HCL 2 MG/2ML IJ SOLN
INTRAMUSCULAR | Status: AC
  Filled 2014-12-09: qty 2

## 2014-12-09 MED ORDER — NITROGLYCERIN IN D5W 200-5 MCG/ML-% IV SOLN
0.0000 ug/min | INTRAVENOUS | Status: DC
  Administered 2014-12-09: 10 ug/min via INTRAVENOUS

## 2014-12-09 MED ORDER — HEPARIN SODIUM (PORCINE) 5000 UNIT/ML IJ SOLN
4000.0000 [IU] | Freq: Once | INTRAMUSCULAR | Status: AC
  Administered 2014-12-09: 4000 [IU] via INTRAVENOUS

## 2014-12-09 MED ORDER — HEPARIN (PORCINE) IN NACL 100-0.45 UNIT/ML-% IJ SOLN
1000.0000 [IU]/h | INTRAMUSCULAR | Status: DC
  Filled 2014-12-09: qty 250

## 2014-12-09 MED ORDER — HEPARIN (PORCINE) IN NACL 2-0.9 UNIT/ML-% IJ SOLN
INTRAMUSCULAR | Status: AC
  Filled 2014-12-09: qty 1000

## 2014-12-09 NOTE — ED Notes (Signed)
Pt. Stated, I started having chest pain then some pressure that started 2 days ago.

## 2014-12-09 NOTE — H&P (Addendum)
Patient ID: Donnie Gedeon MRN: [id number] DOB/AGE: [birth year] 59 y.o. 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 Clotting disorder, schizoaffective disorder, remote tobacco abuse presented to the ED with c/o chest pain. EKG with ST elevation in the inferior leads. Ongoing chest pain. Code STEMI activated. History reveals cardiac cath in 2009 with "mild CAD" but no prior stents.    At the time of my assessment, she has mild chest pain and no other complaints.   Review of systems complete and found to be negative unless listed above   Past Medical History  Diagnosis Date  . Spinal stenosis   . Hypertension   . Pulmonary embolism 04/2001; 09/19/2014    "after gallbladder OR; "  . High cholesterol   . Myocardial infarction 10/2010 X 3    "while hospitalized"  . Pneumonia 04/2014  . Sleep apnea     "mild" (09/19/2014)  . Type II diabetes mellitus     "dx'd in 2007; lost weight; no RX for ~ 4 yr now" (09/19/2014)  . Anemia   . History of blood transfusion 1985    related to hysterectomy  . Esophageal spasm   . Gastritis   . Gastroenteritis   . IBS (irritable bowel syndrome)   . GERD (gastroesophageal reflux disease)   . Headache     "couple /month lately" (09/19/2014)  . Migraine     "a few times/yr" (09/19/2014)  . Osteoarthritis     "qwhere; mostly around my joints" (09/19/2014)  . Spondylosis   . Chronic back pain     "upper and lower" (09/19/2014)  . Depression     "major" (09/19/2014)  . PTSD (post-traumatic stress disorder)   . Schizoaffective disorder     No family history on file.  History   Social History  . Marital Status: Single    Spouse Name: N/A  . Number of Children: N/A  . Years of Education: N/A   Occupational History  . Not on file.   Social History Main Topics  . Smoking status: Former Smoker -- 1.50 packs/day for 10 years    Types: Cigarettes   Quit date: 04/12/1979  . Smokeless tobacco: Never Used  . Alcohol Use: Yes     Comment: 09/19/2014 "nothing in the last 6 months"  . Drug Use: No  . Sexual Activity: Not Currently   Other Topics Concern  . Not on file   Social History Narrative    Past Surgical History  Procedure Laterality Date  . Excision/release bursa hip Right 12/1984  . Total hip arthroplasty Left 04-06-2014  . Tonsillectomy and adenoidectomy  ~ 1968  . Appendectomy  1985  . Abdominal hysterectomy  1985  . Cholecystectomy  2002  . Hernia repair  1985  . Umbilical hernia repair  1985  . Vena cava filter placement  03/2014  . Back surgery    . Anterior cervical decomp/discectomy fusion  10/2012  . Breast cyst excision Right   . Cardiac catheterization   2000's; 2009    Allergies  Allergen Reactions  . Abilify [Aripiprazole] Other (See Comments)    Jerking movement, slow motor skills  . Augmentin [Amoxicillin-Pot Clavulanate] Diarrhea and Nausea And Vomiting  . Bactrim [Sulfamethoxazole-Trimethoprim] Diarrhea  . Ceftin [Cefuroxime Axetil] Diarrhea  . Diclofenac     Other reaction(s): Muscle Pain  . Indocin [Indomethacin] Other (See Comments)    Migraines   .  Linaclotide Diarrhea  . Lisinopril Diarrhea and Other (See Comments)    Other reaction(s): Dizziness, Vomiting  . Morphine     Chest Tightness  . Morphine And Related Other (See Comments)    Chest tightness   . Penicillins Itching  . Simvastatin Other (See Comments)    Muscle Pain  . Sulfa Antibiotics Diarrhea  . Sulfamethoxazole-Trimethoprim Diarrhea  . Wellbutrin [Bupropion] Other (See Comments)    Jerking movement Slurred speech  . Quetiapine Fumarate Palpitations    Prior to Admission Meds:  Prior to Admission medications   Medication Sig Start Date End Date Taking? Authorizing Provider  HYDROcodone-acetaminophen (NORCO/VICODIN) 5-325 MG per tablet Take by mouth. 10/27/14  Yes Historical Provider, MD  apixaban (ELIQUIS) 5 MG TABS  tablet Take 5 mg by mouth 2 (two) times daily.    Historical Provider, MD  diltiazem (CARDIZEM CD) 120 MG 24 hr capsule Take 120 mg by mouth daily. 09/03/14   Historical Provider, MD  enoxaparin (LOVENOX) 150 MG/ML injection Inject 0.91 mLs (135 mg total) into the skin daily. Patient not taking: Reported on 10/22/2014 09/24/14   Hosie Poisson, MD  gabapentin (NEURONTIN) 600 MG tablet Take 600 mg by mouth 3 (three) times daily.    Historical Provider, MD  loratadine (CLARITIN) 10 MG tablet Take 10 mg by mouth daily.    Historical Provider, MD  meclizine (ANTIVERT) 25 MG tablet Take 1 tablet (25 mg total) by mouth once. 10/22/14   Comer Locket, PA-C  Menthol-Camphor (ICY HOT ARTHRITIS PAIN RELIEF) 16-4 % LOTN Apply 1 application topically daily as needed (for pain).    Historical Provider, MD  metFORMIN (GLUCOPHAGE) 500 MG tablet Take 1 tablet (500 mg total) by mouth daily with breakfast. Patient not taking: Reported on 10/22/2014 09/24/14   Hosie Poisson, MD  metoprolol tartrate (LOPRESSOR) 25 MG tablet Take 25 mg by mouth daily.     Historical Provider, MD  Multiple Vitamin (MULTIVITAMIN WITH MINERALS) TABS tablet Take 1 tablet by mouth daily.    Historical Provider, MD  ondansetron (ZOFRAN) 4 MG tablet Take 1 tablet (4 mg total) by mouth every 6 (six) hours. 10/22/14   Comer Locket, PA-C  polyethylene glycol (MIRALAX / GLYCOLAX) packet Take 17 g by mouth daily. 10/28/14   Clayton Bibles, PA-C  traZODone (DESYREL) 100 MG tablet Take 50 mg by mouth at bedtime.     Historical Provider, MD           Physical Exam: Blood pressure 148/76, pulse 73, temperature 98.4 F (36.9 C), temperature source Oral, resp. rate 20, height 5\' 6"  (1.676 m), weight 207 lb (93.895 kg), SpO2 95 %.    General: Well developed, well nourished, appears uncomfortable HEENT: OP clear, mucus membranes moist  SKIN: warm, dry. No rashes.  Neuro: No focal deficits  Musculoskeletal: Muscle strength 5/5 all ext  Psychiatric: Mood  and affect normal  Neck: No JVD, no carotid bruits, no thyromegaly, no lymphadenopathy.  Lungs:Clear bilaterally, no wheezes, rhonci, crackles  Cardiovascular: Regular rate and rhythm. No murmurs, gallops or rubs.  Abdomen:Soft. Bowel sounds present. Non-tender.  Extremities: No lower extremity edema. Pulses are 2 + in the bilateral DP/PT.   Labs:   Lab Results  Component Value Date   WBC 6.3 12/09/2014   HGB 10.6* 12/09/2014   HCT 32.8* 12/09/2014   MCV 89.4 12/09/2014   PLT 378 12/09/2014     Recent Labs Lab 12/09/14 1445 12/09/14 1527  NA 138  --   K 4.7  --  CL 103  --   CO2 29  --   BUN 8  --   CREATININE 0.95  --   CALCIUM 9.3  --   PROT  --  6.4  BILITOT  --  0.3  ALKPHOS  --  107  ALT  --  26  AST  --  29  GLUCOSE 100*  --     EKG: Sinus, ST elevation inferior leads. T wave inversions inferior leads.   Cardiac cath 12/09/14: Hemodynamic Findings: Central aortic pressure: 184/93 Left ventricular pressure: 183/16/32 Angiographic Findings: Left main: No obstructive disease Left Anterior Descending Artery: Large caliber vessel that courses to the apex. Moderate caliber diagonal branch. No obstructive disease.  Circumflex Artery: Large caliber dominant vessel with large first obtuse marginal branch, moderate caliber left posterolateral branch.  Right Coronary Artery: Small to moderate caliber non-dominant vessel with no obstructive disease.  Left Ventricular Angiogram: LVEF=50%.  Impression: 1. No angiographic evidence of CAD 2. Low normal LV systolic function 3. Chest pain with EKG changes, possibly representing coronary vasospasm  ASSESSMENT AND PLAN:   1. Acute chest pain with inferior ST elevation and no evidence of CAD on cardiac cath 2. Known CAD 3. DM 4. HTN 5. HLD, statin intolerant 6. 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 No evidence of CAD on cath. Admit to stepdown ICU. I  reviewed the risk of bleeding with cardiac cath on Eliquis. She and her family understands this. Risks of death with MI outweighed risk of bleeding on Eliquis so we proceed with cardiac cath.   Will treat with IV NTG for possible vasospasm. Will attempt to bring BP down. Continue Cardizem and Metoprolol. Echo in am. Check d-dimer but likelihood of PE on Eliquis is low.   Darlina Guys, MD 12/09/2014, 4:59 PM

## 2014-12-09 NOTE — Progress Notes (Signed)
Md aware of d dimer 1.07. Pt has been on elliquis and will restart on 4/10.  scd's in place.  Will continue to monitor.  Saunders Revel T

## 2014-12-09 NOTE — ED Notes (Signed)
Code Stemi activated 15:48.

## 2014-12-09 NOTE — ED Provider Notes (Signed)
CSN: [id number]     Arrival date & time 12/09/14  1425 History   First MD Initiated Contact with Patient 12/09/14 1502     Chief Complaint  Patient presents with  . Chest Pain     (Consider location/radiation/quality/duration/timing/severity/associated sxs/prior Treatment) HPI  This is a 59 yo female with PMH HTN, PE, with IVC filter in place, on eloquis, remote MI, PNA, DM, HLD, presenting today with CP.  Pt experienced fleeting chest pressure yesterday, which resolved spontaneously.  She then experienced pain again hours ago.    Located substernal area.  Persistent, worsening.  Characterized as pressure.  No aggravating or alleviating factors.  About 5 minutes before my entry into the room, the pain began radiating to the left shoulder.  Positive for nausea, diaphoresis.  Negative for dyspnea, abdominal pain, or back pain.    Past Medical History  Diagnosis Date  . Spinal stenosis   . Hypertension   . Pulmonary embolism 04/2001; 09/19/2014    "after gallbladder OR; "  . High cholesterol   . Myocardial infarction 10/2010 X 3    "while hospitalized"  . Pneumonia 04/2014  . Sleep apnea     "mild" (09/19/2014)  . Type II diabetes mellitus     "dx'd in 2007; lost weight; no RX for ~ 4 yr now" (09/19/2014)  . Anemia   . History of blood transfusion 1985    related to hysterectomy  . Esophageal spasm   . Gastritis   . Gastroenteritis   . IBS (irritable bowel syndrome)   . GERD (gastroesophageal reflux disease)   . Headache     "couple /month lately" (09/19/2014)  . Migraine     "a few times/yr" (09/19/2014)  . Osteoarthritis     "qwhere; mostly around my joints" (09/19/2014)  . Spondylosis   . Chronic back pain     "upper and lower" (09/19/2014)  . Depression     "major" (09/19/2014)  . PTSD (post-traumatic stress disorder)   . Schizoaffective disorder    Past Surgical History  Procedure Laterality Date  . Excision/release bursa hip Right 12/1984  . Total hip arthroplasty Left  04-06-2014  . Tonsillectomy and adenoidectomy  ~ 1968  . Appendectomy  1985  . Abdominal hysterectomy  1985  . Cholecystectomy  2002  . Hernia repair  1985  . Umbilical hernia repair  1985  . Vena cava filter placement  03/2014  . Back surgery    . Anterior cervical decomp/discectomy fusion  10/2012  . Breast cyst excision Right   . Cardiac catheterization   2000's; 2009   No family history on file. History  Substance Use Topics  . Smoking status: Former Smoker -- 1.50 packs/day for 10 years    Types: Cigarettes    Quit date: 04/12/1979  . Smokeless tobacco: Never Used  . Alcohol Use: Yes     Comment: 09/19/2014 "nothing in the last 6 months"   OB History    No data available     Review of Systems  Constitutional: Positive for diaphoresis. Negative for fever and chills.  HENT: Negative for facial swelling.   Eyes: Negative for photophobia and pain.  Respiratory: Negative for cough and shortness of breath.   Cardiovascular: Positive for chest pain. Negative for leg swelling.  Gastrointestinal: Positive for nausea. Negative for vomiting and abdominal pain.  Genitourinary: Negative for dysuria.  Musculoskeletal: Negative for arthralgias.  Skin: Negative for rash and wound.  Neurological: Negative for seizures.  Hematological: Negative for adenopathy.  Allergies  Abilify; Augmentin; Bactrim; Ceftin; Diclofenac; Indocin; Linaclotide; Lisinopril; Morphine; Morphine and related; Penicillins; Simvastatin; Sulfa antibiotics; Sulfamethoxazole-trimethoprim; Wellbutrin; and Quetiapine fumarate  Home Medications   Prior to Admission medications   Medication Sig Start Date End Date Taking? Authorizing Provider  HYDROcodone-acetaminophen (NORCO/VICODIN) 5-325 MG per tablet Take by mouth. 10/27/14  Yes Historical Provider, MD  apixaban (ELIQUIS) 5 MG TABS tablet Take 5 mg by mouth 2 (two) times daily.    Historical Provider, MD  diltiazem (CARDIZEM CD) 120 MG 24 hr capsule Take  120 mg by mouth daily. 09/03/14   Historical Provider, MD  enoxaparin (LOVENOX) 150 MG/ML injection Inject 0.91 mLs (135 mg total) into the skin daily. Patient not taking: Reported on 10/22/2014 09/24/14   Hosie Poisson, MD  gabapentin (NEURONTIN) 600 MG tablet Take 600 mg by mouth 3 (three) times daily.    Historical Provider, MD  loratadine (CLARITIN) 10 MG tablet Take 10 mg by mouth daily.    Historical Provider, MD  meclizine (ANTIVERT) 25 MG tablet Take 1 tablet (25 mg total) by mouth once. 10/22/14   Comer Locket, PA-C  Menthol-Camphor (ICY HOT ARTHRITIS PAIN RELIEF) 16-4 % LOTN Apply 1 application topically daily as needed (for pain).    Historical Provider, MD  metFORMIN (GLUCOPHAGE) 500 MG tablet Take 1 tablet (500 mg total) by mouth daily with breakfast. Patient not taking: Reported on 10/22/2014 09/24/14   Hosie Poisson, MD  metoprolol tartrate (LOPRESSOR) 25 MG tablet Take 25 mg by mouth daily.     Historical Provider, MD  Multiple Vitamin (MULTIVITAMIN WITH MINERALS) TABS tablet Take 1 tablet by mouth daily.    Historical Provider, MD  ondansetron (ZOFRAN) 4 MG tablet Take 1 tablet (4 mg total) by mouth every 6 (six) hours. 10/22/14   Comer Locket, PA-C  polyethylene glycol (MIRALAX / GLYCOLAX) packet Take 17 g by mouth daily. 10/28/14   Clayton Bibles, PA-C  traZODone (DESYREL) 100 MG tablet Take 50 mg by mouth at bedtime.     Historical Provider, MD  warfarin (COUMADIN) 10 MG tablet Take 1 tablet (10 mg total) by mouth daily at 6 PM. Patient not taking: Reported on 10/22/2014 09/24/14   Hosie Poisson, MD   BP 151/74 mmHg  Pulse 71  Temp(Src) 98.4 F (36.9 C) (Oral)  Resp 16  SpO2 100% Physical Exam  Constitutional: She is oriented to person, place, and time. She appears well-developed and well-nourished.  HENT:  Head: Normocephalic and atraumatic.  Mouth/Throat: No oropharyngeal exudate.  Eyes: Conjunctivae are normal. Pupils are equal, round, and reactive to light. No scleral  icterus.  Neck: Normal range of motion. No tracheal deviation present. No thyromegaly present.  Cardiovascular: Normal rate, regular rhythm and normal heart sounds.  Exam reveals no gallop and no friction rub.   No murmur heard. Pulmonary/Chest: Effort normal and breath sounds normal. No stridor. No respiratory distress. She has no wheezes. She has no rales. She exhibits no tenderness.  Abdominal: Soft. She exhibits no distension and no mass. There is no tenderness. There is no rebound and no guarding.  Musculoskeletal: Normal range of motion. She exhibits no edema.  Neurological: She is alert and oriented to person, place, and time.  Skin: Skin is warm. She is diaphoretic.    ED Course  Procedures (including critical care time) Labs Review Labs Reviewed  CBC - Abnormal; Notable for the following:    RBC 3.67 (*)    Hemoglobin 10.6 (*)    HCT 32.8 (*)  All other components within normal limits  BRAIN NATRIURETIC PEPTIDE  BASIC METABOLIC PANEL  HEPATIC FUNCTION PANEL  I-STAT TROPOININ, ED    Imaging Review Dg Chest Portable 1 View  12/09/2014   CLINICAL DATA:  Chest pain.  EXAM: PORTABLE CHEST - 1 VIEW  COMPARISON:  October 22, 2014.  FINDINGS: The heart size and mediastinal contours are within normal limits. Both lungs are clear. No pneumothorax or pleural effusion is noted. The visualized skeletal structures are unremarkable.  IMPRESSION: No acute cardiopulmonary abnormality seen.   Electronically Signed   By: Marijo Conception, M.D.   On: 12/09/2014 17:43    ED ECG REPORT   Date: 12/09/2014 (1431:16)  Rate: 71   Rhythm: normal sinus rhythm  QRS Axis: normal  Intervals: normal  ST/T Wave abnormalities: normal  Conduction Disutrbances:none  Narrative Interpretation:   Old EKG Reviewed: unchanged since EKG on 10/22/14  I have personally reviewed the EKG tracing and agree with the computerized printout as noted.  ED ECG REPORT   Date: 12/09/2014 (1518:45)  Rate: 69   Rhythm: normal sinus rhythm  QRS Axis: normal  Intervals: normal  ST/T Wave abnormalities: ST elevations inferiorly  Conduction Disutrbances:none  Narrative Interpretation:   Old EKG Reviewed: changes noted  I have personally reviewed the EKG tracing and disagree with the computerized printout as noted.  ED ECG REPORT (right-sided EKG)   Date: 12/09/2014  Rate: 68  Rhythm: normal sinus rhythm  QRS Axis: normal  Intervals: normal  ST/T Wave abnormalities: ST elevations inferiorly and acute myocardial infarction  Conduction Disutrbances:none  Narrative Interpretation:   Old EKG Reviewed: changes noted  I have personally reviewed the EKG tracing and disagree with the computerized printout as noted.  MDM   Final diagnoses:  ST elevation myocardial infarction (STEMI), unspecified artery    This is a 59 yo female with PMH HTN, PE, with IVC filter in place, on eloquis, remote MI, PNA, DM, HLD, presenting today with CP.  Pt experienced fleeting chest pressure yesterday, which resolved spontaneously.  She then experienced pain again hours ago.    Located substernal area.  Persistent, worsening.  Characterized as pressure.  No aggravating or alleviating factors.  About 5 minutes before my entry into the room, the pain began radiating to the left shoulder.  Positive for nausea, diaphoresis.  Negative for dyspnea, abdominal pain, or back pain.  Vitals are stable.  CV, resp exams WNL.  Pt appears uncomfortable, diaphoretic.  I am concerned for ACS.  Initial troponin is undetectable.  I have ordered repeat EKG as initial EKG was without acute change.  Also have ordered CXR, aspirin.  Repeat EKG, as above reveals dynamic EKG changes in inferior leads.  I have called cards, ordered heparin.    Right sided EKG reveals even more profound ST elevation in inferior leads.  Cards will take to cath lab.  Code STEMI activated.  Pt remains stable at this time.  Still awaiting CXR, although I doubt  dissection (equal pulses), PTX (equal breath sounds), PE, or PNA (no rales, cough or fever).  Will monitor closely until pt taken to cath lab.     Pt is being taken to the cath lab.    I have discussed case and care has been guided by my attending physician, Darl Householder.   Doy Hutching, MD 12/10/14 5400  Wandra Arthurs, MD 12/11/14 1540

## 2014-12-09 NOTE — Progress Notes (Signed)
Chaplain responded to page for STEMI. Chaplain introduced herself to pt and significant other, Warren. Chaplain offered prayer with pt and family. Chaplain escorted pt significant other to cath lab waiting area. Chaplain offered him beverage and snack, chaplain offered emotional support. Medical team aware of his location. Page chaplain as needed.    12/09/14 1600  Clinical Encounter Type  Visited With Patient and family together  Visit Type Initial;Spiritual support  Spiritual Encounters  Spiritual Needs Emotional;Prayer  Stress Factors  Patient Stress Factors Health changes  Family Stress Factors Health changes  Jarrad Mclees, Epifanio Lesches 12/09/2014 4:39 PM

## 2014-12-09 NOTE — CV Procedure (Signed)
      Cardiac Catheterization Operative Report  Valerie Vazquez [id number] 4/9/20164:22 PM [name], MD  Procedure Performed:  1. Left Heart Catheterization 2. Selective Coronary Angiography 3. Left ventricular angiogram  Operator: Lauree Chandler, MD  Arterial access site:  Right radial artery.   Indication:  59 yo female with history of CAD, DM, HTN, HLD, clotting disorder on Eliquis presenting with chest pain and 51mm ST elevation inferior leads. Code STEMI called. Chest pain resolved by the time she arrived in the cath lab.                            Procedure Details: The risks, benefits, complications, treatment options, and expected outcomes were discussed with the patient. Emergency consent obtained. The patient was brought to the cath lab from the ED. The patient was further sedated with Versed. The right wrist was assessed with a modified Allens test which was positive. The right wrist was prepped and draped in a sterile fashion. 1% lidocaine was used for local anesthesia. Using the modified Seldinger access technique, a 5 French sheath was placed in the right radial artery. 3 mg Verapamil was given through the sheath. Weight based bolus of Angiomax was given and a drip was started. Standard diagnostic catheters were used to perform selective coronary angiography. A pigtail catheter was used to perform a left ventricular angiogram. The sheath was removed from the right radial artery and a Terumo hemostasis band was applied at the arteriotomy site on the right wrist.   There were no immediate complications. The patient was taken to the recovery area in stable condition.   Hemodynamic Findings: Central aortic pressure: 184/93 Left ventricular pressure: 183/16/32  Angiographic Findings:  Left main: No obstructive disease  Left Anterior Descending Artery: Large caliber vessel that courses to the apex. Moderate caliber diagonal branch. No obstructive disease.    Circumflex Artery: Large caliber dominant vessel with large first obtuse marginal branch, moderate caliber left posterolateral branch.   Right Coronary Artery: Small to moderate caliber non-dominant vessel with no obstructive disease.   Left Ventricular Angiogram: LVEF=50%.   Impression: 1. No angiographic evidence of CAD 2. Low normal LV systolic function 3. Chest pain with EKG changes, possibly representing coronary vasospasm  Recommendations: Will start NTG drip with elevated BP and possible vasospasm. Will admit to stepdown ICU. Continue metoprolol and Cardizem. Will restart Eliquis tomorrow. Low suspicion for PE with ongoing use of Eliquis. Will check D-dimer.        Complications:  None. The patient tolerated the procedure well.

## 2014-12-09 NOTE — Progress Notes (Signed)
ANTICOAGULATION CONSULT NOTE - Initial Consult  Pharmacy Consult for Heparin Indication: chest pain/ACS  Allergies  Allergen Reactions  . Abilify [Aripiprazole] Other (See Comments)    Jerking movement, slow motor skills  . Augmentin [Amoxicillin-Pot Clavulanate] Diarrhea and Nausea And Vomiting  . Bactrim [Sulfamethoxazole-Trimethoprim] Diarrhea  . Ceftin [Cefuroxime Axetil] Diarrhea  . Diclofenac     Other reaction(s): Muscle Pain  . Indocin [Indomethacin] Other (See Comments)    Migraines   . Linaclotide Diarrhea  . Lisinopril Diarrhea and Other (See Comments)    Other reaction(s): Dizziness, Vomiting  . Morphine     Chest Tightness  . Morphine And Related Other (See Comments)    Chest tightness   . Penicillins Itching  . Simvastatin Other (See Comments)    Muscle Pain  . Sulfa Antibiotics Diarrhea  . Sulfamethoxazole-Trimethoprim Diarrhea  . Wellbutrin [Bupropion] Other (See Comments)    Jerking movement Slurred speech  . Quetiapine Fumarate Palpitations    Patient Measurements: Height: 5\' 6"  (167.6 cm) Weight: 207 lb (93.895 kg) IBW/kg (Calculated) : 59.3 Heparin Dosing Weight: 80 kg  Vital Signs: Temp: 98.4 F (36.9 C) (04/09 1434) Temp Source: Oral (04/09 1434) BP: 151/79 mmHg (04/09 1525) Pulse Rate: 69 (04/09 1525)  Labs:  Recent Labs  12/09/14 1445  HGB 10.6*  HCT 32.8*  PLT 378    CrCl cannot be calculated (Patient has no serum creatinine result on file.).   Medical History: Past Medical History  Diagnosis Date  . Spinal stenosis   . Hypertension   . Pulmonary embolism 04/2001; 09/19/2014    "after gallbladder OR; "  . High cholesterol   . Myocardial infarction 10/2010 X 3    "while hospitalized"  . Pneumonia 04/2014  . Sleep apnea     "mild" (09/19/2014)  . Type II diabetes mellitus     "dx'd in 2007; lost weight; no RX for ~ 4 yr now" (09/19/2014)  . Anemia   . History of blood transfusion 1985    related to hysterectomy  .  Esophageal spasm   . Gastritis   . Gastroenteritis   . IBS (irritable bowel syndrome)   . GERD (gastroesophageal reflux disease)   . Headache     "couple /month lately" (09/19/2014)  . Migraine     "a few times/yr" (09/19/2014)  . Osteoarthritis     "qwhere; mostly around my joints" (09/19/2014)  . Spondylosis   . Chronic back pain     "upper and lower" (09/19/2014)  . Depression     "major" (09/19/2014)  . PTSD (post-traumatic stress disorder)   . Schizoaffective disorder     Medications:   (Not in a hospital admission) Scheduled:  Infusions:   Assessment: 59yo female with history of MI in 2012, HLD, HTN, DM2 and PE in 09/2014 presents with chest pain. Pharmacy is consulted to dose heparin for ACS/chest pain. Hgb 10.6, Plt 378, sCr 0.95, Trop neg x1.  Goal of Therapy:  Heparin level 0.3-0.7 units/ml Monitor platelets by anticoagulation protocol: Yes   Plan:  Give 4000 units bolus x 1 Start heparin infusion at 1000 units/hr Check anti-Xa level in 6 hours and daily while on heparin Continue to monitor H&H and platelets  Andrey Cota. Diona Foley, PharmD Clinical Pharmacist Pager [phone] 12/09/2014,3:44 PM

## 2014-12-10 DIAGNOSIS — R0789 Other chest pain: Secondary | ICD-10-CM

## 2014-12-10 DIAGNOSIS — I201 Angina pectoris with documented spasm: Principal | ICD-10-CM

## 2014-12-10 DIAGNOSIS — R079 Chest pain, unspecified: Secondary | ICD-10-CM

## 2014-12-10 LAB — CBC
HCT: 30.7 % — ABNORMAL LOW (ref 36.0–46.0)
Hemoglobin: 10 g/dL — ABNORMAL LOW (ref 12.0–15.0)
MCH: 28.8 pg (ref 26.0–34.0)
MCHC: 32.6 g/dL (ref 30.0–36.0)
MCV: 88.5 fL (ref 78.0–100.0)
Platelets: 350 10*3/uL (ref 150–400)
RBC: 3.47 MIL/uL — ABNORMAL LOW (ref 3.87–5.11)
RDW: 13.8 % (ref 11.5–15.5)
WBC: 6.5 10*3/uL (ref 4.0–10.5)

## 2014-12-10 LAB — BASIC METABOLIC PANEL
Anion gap: 13 (ref 5–15)
BUN: 5 mg/dL — ABNORMAL LOW (ref 6–23)
CO2: 23 mmol/L (ref 19–32)
Calcium: 8.6 mg/dL (ref 8.4–10.5)
Chloride: 103 mmol/L (ref 96–112)
Creatinine, Ser: 0.74 mg/dL (ref 0.50–1.10)
GFR calc Af Amer: >90 mL/min (ref >90)
GFR calc non Af Amer: >90 mL/min (ref >90)
Glucose, Bld: 99 mg/dL (ref 70–99)
Potassium: 3.8 mmol/L (ref 3.5–5.1)
Sodium: 139 mmol/L (ref 135–145)

## 2014-12-10 LAB — MRSA PCR SCREENING: MRSA by PCR: NEGATIVE

## 2014-12-10 LAB — TROPONIN I: Troponin I: <0.03 ng/mL (ref <0.031)

## 2014-12-10 MED ORDER — DIPHENHYDRAMINE HCL 25 MG PO CAPS: 25.0000 mg | ORAL_CAPSULE | Freq: Four times a day (QID) | ORAL | Status: DC | PRN

## 2014-12-10 MED ORDER — SODIUM CHLORIDE 0.9 % IV SOLN
INTRAVENOUS | Status: DC
  Administered 2014-12-10: 10 mL via INTRAVENOUS

## 2014-12-10 MED ORDER — FLEET ENEMA 7-19 GM/118ML RE ENEM
1.0000 | ENEMA | Freq: Every day | RECTAL | Status: DC | PRN
  Administered 2014-12-11: 1 via RECTAL
  Filled 2014-12-10 (×3): qty 1

## 2014-12-10 MED ORDER — GUAIFENESIN 100 MG/5ML PO SYRP
200.0000 mg | ORAL_SOLUTION | ORAL | Status: DC | PRN
Start: 2014-12-10 — End: 2014-12-12
  Administered 2014-12-10: 200 mg via ORAL
  Filled 2014-12-10 (×2): qty 10

## 2014-12-10 MED ORDER — ISOSORBIDE MONONITRATE ER 30 MG PO TB24
30.0000 mg | ORAL_TABLET | Freq: Every day | ORAL | Status: DC
  Administered 2014-12-10 – 2014-12-12 (×3): 30 mg via ORAL
  Filled 2014-12-10 (×3): qty 1

## 2014-12-10 NOTE — Progress Notes (Signed)
Pt c/o itching at right radial where dressing s/p cath is place. Removed dressing and applied gauze with regular tape.  Pt also states takes daily ex lax.  Notified Md per pt's request.  Will continue to monitor. Saunders Revel T +

## 2014-12-10 NOTE — Progress Notes (Signed)
Utilization review completed.  

## 2014-12-10 NOTE — Progress Notes (Signed)
Patient ID: Valerie Vazquez, female   DOB: 06-Dec-1955, 59 y.o.   MRN: [id number]   SUBJECTIVE: No chest pain today.  Feels worn out.  +Cough, not short of breath at rest.   Scheduled Meds: . apixaban  5 mg Oral BID  . diltiazem  120 mg Oral Daily  . gabapentin  600 mg Oral TID  . isosorbide mononitrate  30 mg Oral Daily  . loratadine  10 mg Oral Daily  . metoprolol tartrate  25 mg Oral BID  . polyethylene glycol  17 g Oral Daily  . traZODone  50 mg Oral QHS   Continuous Infusions: . sodium chloride Stopped (12/10/14 0818)  . nitroGLYCERIN Stopped (12/10/14 0818)   PRN Meds:.acetaminophen, guaifenesin, ondansetron (ZOFRAN) IV, oxyCODONE-acetaminophen    Filed Vitals:   12/09/14 2317 12/09/14 2322 12/10/14 0316 12/10/14 0818  BP:   111/59 136/78  Pulse:  69  73  Temp: 98.1 F (36.7 C)  98.1 F (36.7 C) 98 F (36.7 C)  TempSrc: Oral  Oral Oral  Resp: 15 18 11 18   Height:      Weight:      SpO2: 90% 96% 98% 100%    Intake/Output Summary (Last 24 hours) at 12/10/14 0923 Last data filed at 12/10/14 0818  Gross per 24 hour  Intake 952.63 ml  Output   2125 ml  Net -1172.37 ml    LABS: Basic Metabolic Panel:  Recent Labs  12/09/14 1445 12/10/14 0345  NA 138 139  K 4.7 3.8  CL 103 103  CO2 29 23  GLUCOSE 100* 99  BUN 8 5*  CREATININE 0.95 0.74  CALCIUM 9.3 8.6   Liver Function Tests:  Recent Labs  12/09/14 1527  AST 29  ALT 26  ALKPHOS 107  BILITOT 0.3  PROT 6.4  ALBUMIN 3.5   No results for input(s): LIPASE, AMYLASE in the last 72 hours. CBC:  Recent Labs  12/09/14 1445 12/10/14 0345  WBC 6.3 6.5  HGB 10.6* 10.0*  HCT 32.8* 30.7*  MCV 89.4 88.5  PLT 378 350   Cardiac Enzymes: No results for input(s): CKTOTAL, CKMB, CKMBINDEX, TROPONINI in the last 72 hours. BNP: Invalid input(s): POCBNP D-Dimer:  Recent Labs  12/09/14 1836  DDIMER 1.07*   Hemoglobin A1C: No results for input(s): HGBA1C in the last 72 hours. Fasting Lipid  Panel: No results for input(s): CHOL, HDL, LDLCALC, TRIG, CHOLHDL, LDLDIRECT in the last 72 hours. Thyroid Function Tests: No results for input(s): TSH, T4TOTAL, T3FREE, THYROIDAB in the last 72 hours.  Invalid input(s): FREET3 Anemia Panel: No results for input(s): VITAMINB12, FOLATE, FERRITIN, TIBC, IRON, RETICCTPCT in the last 72 hours.  RADIOLOGY: Dg Chest Portable 1 View  12/09/2014   CLINICAL DATA:  Chest pain.  EXAM: PORTABLE CHEST - 1 VIEW  COMPARISON:  October 22, 2014.  FINDINGS: The heart size and mediastinal contours are within normal limits. Both lungs are clear. No pneumothorax or pleural effusion is noted. The visualized skeletal structures are unremarkable.  IMPRESSION: No acute cardiopulmonary abnormality seen.   Electronically Signed   By: Marijo Conception, M.D.   On: 12/09/2014 17:43    PHYSICAL EXAM General: NAD Neck: No JVD, no thyromegaly or thyroid nodule.  Lungs: Clear to auscultation bilaterally with normal respiratory effort. CV: Nondisplaced PMI.  Heart regular S1/S2, no S3/S4, no murmur.  No peripheral edema.  No carotid bruit.  Normal pedal pulses.  Abdomen: Soft, nontender, no hepatosplenomegaly, no distention.  Neurologic: Alert and oriented  x 3.  Psych: Normal affect. Extremities: No clubbing or cyanosis.   TELEMETRY: Reviewed telemetry pt in NSR  ASSESSMENT AND PLAN: 59 yo with history of schizoaffective disorder, DM, HTN, and multiple episodes of PE/DVT now on Eliquis with IVC filter presented yesterday with concern for inferior STEMI, LHC showed no significant CAD.   1. Chest pain: Apparently, patient had ECG (maybe with EMS?) that showed inferior ST elevation.  I cannot find this.  However, this morning's ECG shows inferior nonspecific T wave changes.  LHC yesterday showed no CAD and chest pain resolved.  No chest pain today.  There was concern for coronary vasospasm.  Patient had elevated D dimer, but has no further chest pain or dyspnea now, and has  not missed Eliquis per her report.   - Continue Eliquis.  If patient develops more dyspnea or chest pain, would consider CTA chest.  - For vasospasm, diltiazem CD started and will add Imdur 30 daily.  - Echo today, EF 50% on LV-gram.  2. Venous thromboembolism: Recurrent.  As above, on Eliquis and has IVC filter.  Last episodes was in 1/16 when patient was changed from coumadin to Eliquis.  3. Diabetes: Cover with sliding scale.   [name] 12/10/2014 9:29 AM

## 2014-12-10 NOTE — Progress Notes (Signed)
  Echocardiogram 2D Echocardiogram has been performed.  Valerie Vazquez 12/10/2014, 5:05 PM

## 2014-12-11 DIAGNOSIS — R072 Precordial pain: Secondary | ICD-10-CM

## 2014-12-11 DIAGNOSIS — R9431 Abnormal electrocardiogram [ECG] [EKG]: Secondary | ICD-10-CM

## 2014-12-11 LAB — BASIC METABOLIC PANEL
Anion gap: 4 — ABNORMAL LOW (ref 5–15)
BUN: 8 mg/dL (ref 6–23)
CO2: 31 mmol/L (ref 19–32)
Calcium: 8.8 mg/dL (ref 8.4–10.5)
Chloride: 105 mmol/L (ref 96–112)
Creatinine, Ser: 0.79 mg/dL (ref 0.50–1.10)
GFR calc Af Amer: >90 mL/min (ref >90)
GFR calc non Af Amer: 89 mL/min — ABNORMAL LOW (ref >90)
Glucose, Bld: 115 mg/dL — ABNORMAL HIGH (ref 70–99)
Potassium: 4 mmol/L (ref 3.5–5.1)
Sodium: 140 mmol/L (ref 135–145)

## 2014-12-11 LAB — CBC
HCT: 31 % — ABNORMAL LOW (ref 36.0–46.0)
Hemoglobin: 10 g/dL — ABNORMAL LOW (ref 12.0–15.0)
MCH: 29.3 pg (ref 26.0–34.0)
MCHC: 32.3 g/dL (ref 30.0–36.0)
MCV: 90.9 fL (ref 78.0–100.0)
Platelets: 350 10*3/uL (ref 150–400)
RBC: 3.41 MIL/uL — ABNORMAL LOW (ref 3.87–5.11)
RDW: 13.9 % (ref 11.5–15.5)
WBC: 7.4 10*3/uL (ref 4.0–10.5)

## 2014-12-11 MED ORDER — CYCLOBENZAPRINE HCL 10 MG PO TABS
10.0000 mg | ORAL_TABLET | Freq: Three times a day (TID) | ORAL | Status: DC | PRN
  Administered 2014-12-11: 10 mg via ORAL
  Filled 2014-12-11: qty 1

## 2014-12-11 MED FILL — Sodium Chloride IV Soln 0.9%: INTRAVENOUS | Qty: 50 | Status: AC

## 2014-12-11 NOTE — Progress Notes (Signed)
SUBJECTIVE: No chest pain or SOB. Left arm pain is improving. She has not been out of bed today.   BP 113/48 mmHg  Pulse 86  Temp(Src) 98.1 F (36.7 C) (Oral)  Resp 18  Ht 5\' 6"  (1.676 m)  Wt 203 lb 0.7 oz (92.1 kg)  BMI 32.79 kg/m2  SpO2 95%  Intake/Output Summary (Last 24 hours) at 12/11/14 1008 Last data filed at 12/11/14 0900  Gross per 24 hour  Intake    740 ml  Output   1825 ml  Net  -1085 ml    PHYSICAL EXAM General: Well developed, well nourished, in no acute distress. Alert and oriented x 3.  Psych:  Good affect, responds appropriately Neck: No JVD. No masses noted.  Lungs: Clear bilaterally with no wheezes or rhonci noted.  Heart: RRR with no murmurs noted. Abdomen: Bowel sounds are present. Soft, non-tender.  Extremities: No lower extremity edema.   LABS: Basic Metabolic Panel:  Recent Labs  12/10/14 0345 12/11/14 0308  NA 139 140  K 3.8 4.0  CL 103 105  CO2 23 31  GLUCOSE 99 115*  BUN 5* 8  CREATININE 0.74 0.79  CALCIUM 8.6 8.8   CBC:  Recent Labs  12/09/14 1445 12/10/14 0345  WBC 6.3 6.5  HGB 10.6* 10.0*  HCT 32.8* 30.7*  MCV 89.4 88.5  PLT 378 350   Cardiac Enzymes:  Recent Labs  12/10/14 1252  TROPONINI <0.03   Current Meds: . apixaban  5 mg Oral BID  . diltiazem  120 mg Oral Daily  . gabapentin  600 mg Oral TID  . isosorbide mononitrate  30 mg Oral Daily  . loratadine  10 mg Oral Daily  . metoprolol tartrate  25 mg Oral BID  . polyethylene glycol  17 g Oral Daily  . traZODone  50 mg Oral QHS   Echo 12/10/14:  Left ventricle: The cavity size was normal. There was mild concentric hypertrophy. Systolic function was normal. The estimated ejection fraction was in the range of 55% to 60%. Wall motion was normal; there were no regional wall motion abnormalities. Doppler parameters are consistent with abnormal left ventricular relaxation (grade 1 diastolic dysfunction). There was no evidence of elevated  ventricular filling pressure by Doppler parameters. - Aortic valve: Trileaflet; normal thickness leaflets. There was no regurgitation. - Mitral valve: Structurally normal valve. There was no regurgitation. - Left atrium: The atrium was mildly dilated. - Right ventricle: Systolic function was normal. - Right atrium: The atrium was normal in size. - Tricuspid valve: There was mild regurgitation. - Pulmonic valve: There was no regurgitation. - Pulmonary arteries: Systolic pressure was within the normal range. - Inferior vena cava: The vessel was normal in size. - Pericardium, extracardiac: There was no pericardial effusion.  ASSESSMENT AND PLAN: 59 yo with history of schizoaffective disorder, DM, HTN, and multiple episodes of PE/DVT now on Eliquis with IVC filter presented 12/09/14 with chest pain and concern for inferior STEMI, LHC showed no significant CAD. Possible coronary vasospasm.   1. Chest pain: No evidence of CAD on cath 12/09/14. No chest pain today.There was concern for coronary vasospasm. Patient had elevated D dimer, but has no further chest pain or dyspnea now, and has not missed Eliquis per her report. No plans for workup as PE is unlikely since she has been on Eliquis. Echo with normal LV systolic function.  - Continue Eliquis. - For vasospasm, continue Diltiazem and Imdur.    2. Venous thromboembolism:  Recurrent. As above, on Eliquis and has IVC filter.   3. Diabetes: Cover with sliding scale.   Transfer to telemetry unit today and ambulate. She is feeling better but not back at baseline and not ambulating yet. Hopefully she will continue to improve and can be discharged home tomorrow.     MCALHANY,CHRISTOPHER  4/11/201610:08 AM

## 2014-12-11 NOTE — Progress Notes (Signed)
Dr. Angelena Form notified of pt complaining of headache since isosorbide given.  Pt states she also had a headache yesterday after medication given.  Pt also requesting flexeril, which she takes at home for muscle spams.  Pt states headache is getting better after pain medication and other interventions.  Will continue to monitor pt closely.

## 2014-12-12 DIAGNOSIS — K5900 Constipation, unspecified: Secondary | ICD-10-CM | POA: Diagnosis present

## 2014-12-12 LAB — BASIC METABOLIC PANEL
Anion gap: 8 (ref 5–15)
BUN: 10 mg/dL (ref 6–23)
CO2: 27 mmol/L (ref 19–32)
Calcium: 9.2 mg/dL (ref 8.4–10.5)
Chloride: 104 mmol/L (ref 96–112)
Creatinine, Ser: 0.72 mg/dL (ref 0.50–1.10)
GFR calc Af Amer: >90 mL/min (ref >90)
GFR calc non Af Amer: >90 mL/min (ref >90)
Glucose, Bld: 102 mg/dL — ABNORMAL HIGH (ref 70–99)
Potassium: 4 mmol/L (ref 3.5–5.1)
Sodium: 139 mmol/L (ref 135–145)

## 2014-12-12 LAB — CBC
HCT: 33.9 % — ABNORMAL LOW (ref 36.0–46.0)
Hemoglobin: 11.1 g/dL — ABNORMAL LOW (ref 12.0–15.0)
MCH: 29.2 pg (ref 26.0–34.0)
MCHC: 32.7 g/dL (ref 30.0–36.0)
MCV: 89.2 fL (ref 78.0–100.0)
Platelets: 353 10*3/uL (ref 150–400)
RBC: 3.8 MIL/uL — ABNORMAL LOW (ref 3.87–5.11)
RDW: 13.8 % (ref 11.5–15.5)
WBC: 7.3 10*3/uL (ref 4.0–10.5)

## 2014-12-12 MED ORDER — ONDANSETRON HCL 4 MG PO TABS: 4.0000 mg | ORAL_TABLET | Freq: Four times a day (QID) | ORAL | Status: DC | PRN

## 2014-12-12 MED ORDER — APIXABAN 5 MG PO TABS: 5.0000 mg | ORAL_TABLET | Freq: Every day | ORAL | Status: DC

## 2014-12-12 MED ORDER — DILTIAZEM HCL ER COATED BEADS 120 MG PO CP24: 120.0000 mg | ORAL_CAPSULE | Freq: Every day | ORAL | Status: DC

## 2014-12-12 MED ORDER — SENNA 8.6 MG PO TABS
2.0000 | ORAL_TABLET | Freq: Two times a day (BID) | ORAL | Status: DC
  Filled 2014-12-12 (×2): qty 2

## 2014-12-12 MED ORDER — ISOSORBIDE MONONITRATE ER 30 MG PO TB24: 15.0000 mg | ORAL_TABLET | Freq: Every day | ORAL | Status: DC

## 2014-12-12 MED ORDER — CARVEDILOL 6.25 MG PO TABS: 6.2500 mg | ORAL_TABLET | Freq: Two times a day (BID) | ORAL | Status: DC

## 2014-12-12 MED ORDER — APIXABAN 5 MG PO TABS: 5.0000 mg | ORAL_TABLET | Freq: Two times a day (BID) | ORAL | Status: DC

## 2014-12-12 MED ORDER — SENNOSIDES 15 MG PO CHEW: 2.0000 | CHEWABLE_TABLET | Freq: Two times a day (BID) | ORAL | Status: DC

## 2014-12-12 MED ORDER — CARVEDILOL 6.25 MG PO TABS
6.2500 mg | ORAL_TABLET | Freq: Two times a day (BID) | ORAL | Status: DC
  Administered 2014-12-12: 6.25 mg via ORAL
  Filled 2014-12-12 (×2): qty 1

## 2014-12-12 NOTE — Care Management Note (Signed)
    Page 1 of 1   12/12/2014     2:44:50 PM CARE MANAGEMENT NOTE 12/12/2014  Patient:  Valerie Vazquez, Valerie Vazquez   Account Number:  [id number]  Date Initiated:  12/12/2014  Documentation initiated by:  Marvetta Gibbons  Subjective/Objective Assessment:   Pt admitted with chest pain     Action/Plan:   PTA pt lived at home   Anticipated DC Date:  12/13/2014   Anticipated DC Plan:  HOME/SELF CARE         Choice offered to / List presented to:             Status of service:  Completed, signed off Medicare Important Message given?  YES (If response is "NO", the following Medicare IM given date fields will be blank) Date Medicare IM given:  12/12/2014 Medicare IM given by:  Marvetta Gibbons Date Additional Medicare IM given:   Additional Medicare IM given by:    Discharge Disposition:  HOME/SELF CARE  Per UR Regulation:  Reviewed for med. necessity/level of care/duration of stay  If discussed at [place] of Stay Meetings, dates discussed:    Comments:

## 2014-12-12 NOTE — Progress Notes (Signed)
Patient Name: Valerie Vazquez Date of Encounter: 12/12/2014  Principal Problem:   Coronary vasospasm Active Problems:   Constipation   Primary Cardiologist: Dr Angelena Form  Patient Profile: 59 yo with history of schizoaffective disorder, DM, HTN, and multiple episodes of PE/DVT now on Eliquis with IVC filter presented 12/09/14 with chest pain and concern for inferior STEMI, LHC showed no significant CAD. Felt coronary vasospasm w/ dynamic ECG changes w/ chest pain   SUBJECTIVE: Some brief chest pain, lasting seconds only, severe HA w/ Imdur. Is constipated, chronic problem. She takes Ex-Lax at home which works, had enema last pm w/ no results, refuses Xray of abdomen, says formulation of Ex-Lax is what she needs, refuses bisacodyl in another form.   OBJECTIVE Filed Vitals:   12/11/14 1250 12/11/14 1400 12/11/14 1415 12/11/14 1922  BP: 122/56  122/65 132/72  Pulse:   65 76  Temp: 98 F (36.7 C)  98.4 F (36.9 C) 98.4 F (36.9 C)  TempSrc: Oral  Oral Oral  Resp: 16 18 18 18   Height:      Weight:      SpO2:   97% 94%    Intake/Output Summary (Last 24 hours) at 12/12/14 1015 Last data filed at 12/11/14 1200  Gross per 24 hour  Intake    120 ml  Output      0 ml  Net    120 ml   Filed Weights   12/09/14 1540 12/09/14 1729  Weight: 207 lb (93.895 kg) 203 lb 0.7 oz (92.1 kg)    PHYSICAL EXAM General: Well developed, well nourished, female in no acute distress. Head: Normocephalic, atraumatic.  Neck: Supple without bruits, JVD not elevated. Lungs:  Resp regular and unlabored, CTA. Heart: RRR, S1, S2, no S3, S4, soft murmur; no rub. Abdomen: Soft, non-tender, non-distended, BS + x 4.  Extremities: No clubbing, cyanosis, no edema. Right radial cath site with minimal ecchymosis, no hematoma Neuro: Alert and oriented X 3. Moves all extremities spontaneously. Psych: Normal affect.  LABS: CBC:  Recent Labs  12/11/14 0308 12/12/14 0414  WBC 7.4 7.3  HGB 10.0*  11.1*  HCT 31.0* 33.9*  MCV 90.9 89.2  PLT 350 884   Basic Metabolic Panel:  Recent Labs  12/11/14 0308 12/12/14 0414  NA 140 139  K 4.0 4.0  CL 105 104  CO2 31 27  GLUCOSE 115* 102*  BUN 8 10  CREATININE 0.79 0.72  CALCIUM 8.8 9.2   Liver Function Tests:  Recent Labs  12/09/14 1527  AST 29  ALT 26  ALKPHOS 107  BILITOT 0.3  PROT 6.4  ALBUMIN 3.5   Cardiac Enzymes:  Recent Labs  12/10/14 1252  TROPONINI <0.03    Recent Labs  12/09/14 1451  TROPIPOC 0.00   BNP:  B NATRIURETIC PEPTIDE  Date/Time Value Ref Range Status  12/09/2014 02:45 PM 90.6 0.0 - 100.0 pg/mL Final  09/19/2014 03:11 PM 28.9 0.0 - 100.0 pg/mL Final    Comment:    Please note change in reference range.   D-dimer:  Recent Labs  12/09/14 1836  DDIMER 1.07*   Dg Chest Portable 1 View 12/09/2014   CLINICAL DATA:  Chest pain.  EXAM: PORTABLE CHEST - 1 VIEW  COMPARISON:  October 22, 2014.  FINDINGS: The heart size and mediastinal contours are within normal limits. Both lungs are clear. No pneumothorax or pleural effusion is noted. The visualized skeletal structures are unremarkable.  IMPRESSION: No acute cardiopulmonary abnormality seen.  Electronically Signed   By: Marijo Conception, M.D.   On: 12/09/2014 17:43   TELE:  SR, sinus brady, no sig ectopy  Current Medications:  . apixaban  5 mg Oral BID  . diltiazem  120 mg Oral Daily  . gabapentin  600 mg Oral TID  . isosorbide mononitrate  30 mg Oral Daily  . loratadine  10 mg Oral Daily  . metoprolol tartrate  25 mg Oral BID  . polyethylene glycol  17 g Oral Daily  . traZODone  50 mg Oral QHS   . sodium chloride Stopped (12/10/14 0818)  . nitroGLYCERIN Stopped (12/10/14 0818)    ASSESSMENT AND PLAN: Principal Problem:   Coronary vasospasm - will hold Imdur till MD sees, can try 15 mg daily - on Cardizem CD 120 mg, new. With constipation, MD advise on changing to amlodipine - PTA BB is Toprol XL 25 mg qd, now on Lopressor 25 mg  bid - was taking ASA/Caffeine tabs prn, will d/c this.    Active problems   Constipation - on Miralax qd, pt says this is not helpful - enema last pm, no results - hospital does not carry Ex-Lax, she refuses this med in another form. - advised her to resume home regimen at d/c    Elevated d-dimer - hx DVT/PE - already on Eliquis - no plans for further evaluation as it would not change treatment  Plan - ambulate, possible d/c today  Signed, Rosaria Ferries , PA-C 10:15 AM 12/12/2014   Attending Note:   The patient was seen and examined.  Agree with assessment and plan as noted above.  Changes made to the above note as needed.  Pt likely has coronary spasm. She had minimal ST elevation in the inferior leads.  No significant CAD at time of cath. She may have had coronary spasm. Will change her metoprolol to coreg. Will add low dose imdur.  Follow up with Dr. Adolph Pollack in [facility]  or with Dr. Angelena Form.     Thayer Headings, Brooke Bonito., MD, [facility] 12/12/2014, 11:11 AM [address]  [place] Pager [phone]

## 2014-12-12 NOTE — Discharge Summary (Signed)
CARDIOLOGY DISCHARGE SUMMARY   Patient ID: Valerie Vazquez MRN: [id number] DOB/AGE: 1956/07/19 59 y.o.  Admit date: 12/09/2014 Discharge date: 12/12/2014  PCP: [name], MD Primary Cardiologist: Dr. Angelena Form  Primary Discharge Diagnosis:  Coronary vasospasm, causing angina Secondary Discharge Diagnosis:    Constipation  Procedure Performed:  1. Left Heart Catheterization 2. Selective Coronary Angiography 3. Left ventricular angiogram  Hospital Course: Valerie Vazquez is a 59 y.o. 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 She came to the hospital on 12/09/2014 with chest pain. Her initial ECG was normal, but she had dynamic ECG changes with chest pain and code STEMI was called. She was taken to the Cath Lab.  Cardiac catheterization results are below. She had no significant CAD in her EF was low normal. Cardiac enzymes were also negative. Medical therapy was recommended for coronary vasospasm and she was started on diltiazem. She was also started on Imdur. She had problems with the Imdur because of headache, but agreed to stay on a low dose of it as the medications were helping her symptoms.  A d-dimer was performed and was elevated, but she has a possible history of clotting problems and has had multiple episodes of PE/DVT. She is currently on Eliquis and is to continue this, so no further workup was recommended at this time.  She was noted to have problems with constipation chronically. She is to restart her home constipation regimen and is to continue the diltiazem for now. If her symptoms seemed worsened because of the medication, she can be changed to amlodipine. She had been on metoprolol prior to admission and this was initially increased. However, Dr. Acie Fredrickson felt that carvedilol was a better beta blocker for her because of its nonselective properties and alpha blocking  properties.  On 12/12/2014, she was seen by Dr. Acie Fredrickson and all data were reviewed. Her medications were adjusted and the patient stated she was feeling better, almost back to baseline. She had been on over-the-counter aspirin/caffeine preparation as needed and is to discontinue this. Her chest pain was minimal and lasting only a few seconds at a time. No further inpatient workup was indicated and she is considered stable for discharge, to follow up as an outpatient.   Labs:   Lab Results  Component Value Date   WBC 7.3 12/12/2014   HGB 11.1* 12/12/2014   HCT 33.9* 12/12/2014   MCV 89.2 12/12/2014   PLT 353 12/12/2014    Recent Labs Lab 12/09/14 1527  12/12/14 0414  NA  --   < > 139  K  --   < > 4.0  CL  --   < > 104  CO2  --   < > 27  BUN  --   < > 10  CREATININE  --   < > 0.72  CALCIUM  --   < > 9.2  PROT 6.4  --   --   BILITOT 0.3  --   --   ALKPHOS 107  --   --   ALT 26  --   --   AST 29  --   --   GLUCOSE  --   < > 102*  < > = values in this interval not displayed.  Recent Labs  12/10/14 1252  TROPONINI <0.03   B NATRIURETIC PEPTIDE  Date/Time Value Ref Range Status  12/09/2014 02:45 PM 90.6 0.0 - 100.0 pg/mL  Final  09/19/2014 03:11 PM 28.9 0.0 - 100.0 pg/mL Final    Comment:    Please note change in reference range.      Radiology: Dg Chest Portable 1 View 12/09/2014   CLINICAL DATA:  Chest pain.  EXAM: PORTABLE CHEST - 1 VIEW  COMPARISON:  October 22, 2014.  FINDINGS: The heart size and mediastinal contours are within normal limits. Both lungs are clear. No pneumothorax or pleural effusion is noted. The visualized skeletal structures are unremarkable.  IMPRESSION: No acute cardiopulmonary abnormality seen.   Electronically Signed   By: Marijo Conception, M.D.   On: 12/09/2014 17:43    Cardiac Cath: 12/09/2014 Angiographic Findings: Left main: No obstructive disease Left Anterior Descending Artery: Large caliber vessel that courses to the apex. Moderate caliber  diagonal branch. No obstructive disease.  Circumflex Artery: Large caliber dominant vessel with large first obtuse marginal branch, moderate caliber left posterolateral branch.  Right Coronary Artery: Small to moderate caliber non-dominant vessel with no obstructive disease.  Left Ventricular Angiogram: LVEF=50%.  Impression: 1. No angiographic evidence of CAD 2. Low normal LV systolic function 3. Chest pain with EKG changes, possibly representing coronary vasospasm Recommendations: Will start NTG drip with elevated BP and possible vasospasm. Will admit to stepdown ICU. Continue metoprolol and Cardizem. Will restart Eliquis tomorrow. Low suspicion for PE with ongoing use of Eliquis. Will check D-dimer.   EKG: Sinus rhythm, with chest pain patient had approximately 1 mm ST elevation inferior leads  FOLLOW UP PLANS AND APPOINTMENTS Allergies  Allergen Reactions  . Abilify [Aripiprazole] Other (See Comments)    Jerking movement, slow motor skills  . Augmentin [Amoxicillin-Pot Clavulanate] Diarrhea and Nausea And Vomiting  . Bactrim [Sulfamethoxazole-Trimethoprim] Diarrhea  . Ceftin [Cefuroxime Axetil] Diarrhea  . Diclofenac Other (See Comments)     Muscle Pain  . Indocin [Indomethacin] Other (See Comments)    Migraines   . Linaclotide Diarrhea  . Lisinopril Diarrhea and Other (See Comments)    Other reaction(s): Dizziness, Vomiting  . Morphine Other (See Comments)    Chest Tightness  . Morphine And Related Other (See Comments)    Chest tightness   . Penicillins Itching  . Simvastatin Other (See Comments)    Muscle Pain  . Sulfa Antibiotics Diarrhea  . Sulfamethoxazole-Trimethoprim Diarrhea  . Wellbutrin [Bupropion] Other (See Comments)    Jerking movement Slurred speech  . Quetiapine Fumarate Palpitations     Medication List    STOP taking these medications        Aspirin-Caffeine 500-32.5 MG Tabs     metoprolol succinate 25 MG 24 hr tablet  Commonly known as:   TOPROL-XL      TAKE these medications        apixaban 5 MG Tabs tablet  Commonly known as:  ELIQUIS  Take 1 tablet (5 mg total) by mouth 2 (two) times daily.     carvedilol 6.25 MG tablet  Commonly known as:  COREG  Take 1 tablet (6.25 mg total) by mouth 2 (two) times daily with a meal.     cyclobenzaprine 10 MG tablet  Commonly known as:  FLEXERIL  Take 10 mg by mouth every 8 (eight) hours.     dicyclomine 10 MG capsule  Commonly known as:  BENTYL  Take 20 mg by mouth every 6 (six) hours.     diltiazem 120 MG 24 hr capsule  Commonly known as:  CARDIZEM CD  Take 1 capsule (120 mg total) by mouth daily.  diphenhydrAMINE 25 MG tablet  Commonly known as:  BENADRYL  Take 50 mg by mouth at bedtime.     EX-LAX PO  Take 2 tablets by mouth 2 (two) times daily.     furosemide 20 MG tablet  Commonly known as:  LASIX  Take 20 mg by mouth daily as needed for fluid or edema.     gabapentin 600 MG tablet  Commonly known as:  NEURONTIN  Take 600 mg by mouth every 8 (eight) hours.     ICY HOT ARTHRITIS PAIN RELIEF 16-4 % Lotn  Generic drug:  Menthol-Camphor  Apply 1 application topically daily as needed (for pain).     isosorbide mononitrate 30 MG 24 hr tablet  Commonly known as:  IMDUR  Take 0.5 tablets (15 mg total) by mouth daily.     loratadine 10 MG tablet  Commonly known as:  CLARITIN  Take 10 mg by mouth daily.     metFORMIN 500 MG tablet  Commonly known as:  GLUCOPHAGE  Take 1 tablet (500 mg total) by mouth daily with breakfast.     multivitamin with minerals Tabs tablet  Take 1 tablet by mouth daily.     ondansetron 4 MG tablet  Commonly known as:  ZOFRAN  Take 1 tablet (4 mg total) by mouth every 6 (six) hours as needed for nausea or vomiting.     oxyCODONE-acetaminophen 10-325 MG per tablet  Commonly known as:  PERCOCET  Take 1 tablet by mouth every 6 (six) hours as needed for pain.     polyethylene glycol packet  Commonly known as:  MIRALAX /  GLYCOLAX  Take 17 g by mouth daily.     traZODone 100 MG tablet  Commonly known as:  DESYREL  Take 50 mg by mouth at bedtime as needed for sleep.        Discharge Instructions    Diet - low sodium heart healthy    Complete by:  As directed      Increase activity slowly    Complete by:  As directed           Follow-up Information    Follow up with Lauree Chandler, MD.   Specialty:  Cardiology   Why:  The office will call.   Contact information:   [address] [place]  [address] [phone]       BRING ALL MEDICATIONS WITH YOU TO FOLLOW UP APPOINTMENTS  Time spent with patient to include physician time: 42 min Signed: Rosaria Ferries, PA-C 12/12/2014, 12:40 PM Co-Sign MD  Attending Note:   The patient was seen and examined.  Agree with assessment and plan as noted above.  Changes made to the above note as needed.  Cath shows no significant atherosclerosis. She may have coronary spasm. Have added low dose Imdur and changed metoprolol to coreg.   Follow up with Dr. Nehemiah Massed / Angelena Form.   Thayer Headings, Brooke Bonito., MD, [facility] 12/13/2014, 6:12 AM [address]  [place] Pager [phone]

## 2014-12-12 NOTE — Discharge Instructions (Signed)
Call [phone] for bleeding, swelling, or drainage at the cath site.

## 2014-12-12 NOTE — Progress Notes (Signed)
Medicare Important Message given? YES  (If response is "NO", the following Medicare IM given date fields will be blank)  Date Medicare IM given: 12/12/14 Medicare IM given by:  Dahlia Client [employer]

## 2014-12-12 NOTE — Progress Notes (Signed)
Pt/family given discharge instructions, medication lists, follow up appointments, and when to call the doctor.  Pt/family verbalizes understanding. Pt given signs and symptoms of infection. Pt given education on all current medications. Payton Emerald, RN

## 2014-12-14 DIAGNOSIS — I201 Angina pectoris with documented spasm: Secondary | ICD-10-CM | POA: Diagnosis not present

## 2014-12-14 DIAGNOSIS — G8929 Other chronic pain: Secondary | ICD-10-CM | POA: Diagnosis not present

## 2014-12-14 DIAGNOSIS — F419 Anxiety disorder, unspecified: Secondary | ICD-10-CM | POA: Diagnosis not present

## 2014-12-14 DIAGNOSIS — F329 Major depressive disorder, single episode, unspecified: Secondary | ICD-10-CM | POA: Diagnosis not present

## 2014-12-14 DIAGNOSIS — I10 Essential (primary) hypertension: Secondary | ICD-10-CM | POA: Diagnosis not present

## 2014-12-14 DIAGNOSIS — R079 Chest pain, unspecified: Secondary | ICD-10-CM | POA: Diagnosis not present

## 2014-12-23 NOTE — Consult Note (Signed)
Brief Consult Note: Diagnosis: sinus tahcycardia  with PVC.   Patient was seen by consultant.   Consult note dictated.   Orders entered.   Discussed with Attending MD.   Comments: sinus tachy with JPV:GKKDPT due to  hip pain r/o infection;with low grade fever today check blood cultures,urine cultures,chest xray had preop echo with DR.Kowalski,so will noit order that, increase cardizem cd to 180 mg daily.  Electronic Signatures: Epifanio Lesches (MD)  (Signed 07-Aug-15 19:00)  Authored: Brief Consult Note   Last Updated: 07-Aug-15 19:00 by Epifanio Lesches (MD)

## 2014-12-23 NOTE — Op Note (Signed)
PATIENT NAME:  Valerie Vazquez, Valerie Vazquez MR#:  [id number] DATE OF BIRTH:  1955-12-21  DATE OF PROCEDURE:  04/06/2014  PREOPERATIVE DIAGNOSIS: Severe left hip osteoarthritis.   POSTOPERATIVE DIAGNOSIS: Severe left hip osteoarthritis.   PROCEDURE: Left total hip replacement.   ANESTHESIA: Spinal.   SURGEON: Dr. Rudene Christians.  ASSISTANT: Reche Dixon, PA-C.   DESCRIPTION OF PROCEDURE: The patient was brought to the operating room and after adequate anesthesia was obtained, the patient was placed on the operative table with the right foot on a well-padded table. The left foot was in the [place] boot. The C-arm was brought in and good visualization of the hip was obtained. The hip was prepped and draped in the usual sterile fashion. After patient identification and timeout procedures were completed direct anterior approach was made and the incision was centered over the greater trochanter and a tensor fascia muscle. The incision carried down through the fascia and a deep retractor was placed. The lateral femoral circumflex vessels were identified and ligated. The anterior capsule was exposed and a capsulotomy carried out. The head was then removed after creating a femoral neck osteotomy at the appropriate level. On removal of the head, it was noted to have complete DJD with exposed bone on the femoral head and extensive spurs. The cup was sclerotic. Labrum was excised. Sequential reaming was carried out to 52 mm and a 52-mm Versafit cup fit very well. This was impacted into place and was stable. Next, attention was turned to the femur. The leg externally rotated. Pubofemoral and ischiofemoral releases were carried out. The hip was quite stiff but after adequate release, the hip could be dropped down into extension with significant external rotation. The canal was difficult initially starting to get into but after opening with the starter broach, broaching up to the #3 gave a good fill of the canal and trials were placed. The  final implants were chosen after comparisons of the trial to the initial x-ray. A 3 AMIS stem was impacted into place with an S28 head and 52-mm Versafit cup DM liner assembled on the back table, impacted onto the stem. The hip was reduced and was stable to 90 degrees external rotation. It was thoroughly irrigated. The deep fascia was repaired using a heavy quill suture in a running manner. Subcutaneous drain, 2-0 closed subcutaneously and skin staples. Xeroform, 4 x 4's, ABD and tape.  ESTIMATED BLOOD LOSS: 850 with significant oozing from the acetabular and femoral bone. There were no complications.   SPECIMEN: Removed femoral head.   CONDITION: To recovery room, stable.    ____________________________ Laurene Footman, MD mjm:lt D: 04/06/2014 17:23:03 ET T: 04/06/2014 21:11:49 ET JOB#: [id number]  cc: Laurene Footman, MD, <Dictator> Laurene Footman MD ELECTRONICALLY SIGNED 04/07/2014 23:32

## 2014-12-23 NOTE — Consult Note (Signed)
PATIENT NAME:  BOBBI, YOUNT MR#:  [id number] DATE OF BIRTH:  12/13/1955  DATE OF CONSULTATION:  04/07/2014  REFERRING PHYSICIAN:    CONSULTING PHYSICIAN:  Epifanio Lesches, MD  PRIMARY CARE PHYSICIAN:  [name], MD    CONSULT REQUESTING PHYSICIAN:  Laurene Footman, MD  REASON FOR CONSULT: Tachycardia.   HISTORY OF PRESENT ILLNESS: A 59 year old female patient admitted to Dr. Theodore Demark service and had left hip arthroplasty on 08/06. The patient noted to have palpitations feeling and fluttering of the heart feeling today and we were consulted for that. The patient seen immediately, and EKG shows she is in sinus tachycardia with 117 beats per minute. The patient denies any chest pain, denies any trouble breathing; her main complaint is her hip pain. The patient was getting pain medications yesterday and she had to get Narcan because she was sensitive to the morphine. She is right now on oxycodone 5 to 10 mg every 4 p.r.n. for the pain and she is also on the meperidine 25 to 50 mg every 3 to 4 hours p.r.n. for pain. The patient says that her hip is still hurting and asking for the pain medication. The patient denies any chest pain or trouble breathing. The patient's heart rate is regular and she has no dizziness. The patient has seen Dr. Nehemiah Massed before the operation for preop check-up and she had an echocardiogram.   PAST MEDICAL HISTORY: Significant for history of hypertension, hyperlipidemia, history of pulmonary emboli, and status post IVC filter. She has history of DVT, depression, generalized headaches, and diet-controlled diabetes, and anemia.   PAST SURGICAL HISTORY: Significant for adenoidectomy, appendectomy, right inguinal hernia surgery, cholecystectomy, tonsillectomy, hysterectomy, right hip bursectomy, back surgeries, and cardiac catheterization, she also had an IVC filter by Dr. Lucky Cowboy on 07/20.   SOCIAL HISTORY: Patient quit smoking, she was smoking 35 years ago, no drinking, no  drugs.   ALLERGIES: THE PATIENT IS ALLERGIC TO ABILIFY, AUGMENTIN, BACTRIM, CEFTIN, DICLOFENAC, INDOCIN, LISINOPRIL, MORPHINE, SEROQUEL and SULFA.   CURRENT MEDICATIONS:  1.  Roxicodone 5 to 10 mg every 4 hours for pain.  2.  Pantoprazole 40 mg p.o. daily.  3.  Senna 2 tablets p.o. b.i.d.  4.  Tramadol 50 to 100 mg every 4 p.r.n. for moderate pain.  5.  Trazodone 50 mg p.o. b.i.d.  6.  Lovenox 30 mg every 12 hours.  7.  Naloxone 1 mg as needed.  8.  Neurontin 300 mg p.o. t.i.d.  9.  Loratadine 10 mg p.o. daily.  10. She is on milk of magnesia as needed for constipation.  11.  She is on diltiazem ER, 120 mg p.o. daily.  12.  Flexeril 10 mg every 8 hours p.r.n.  13.  Bisacodyl 10 mg p.r.n.   REVIEW OF SYSTEMS:  Constitutional, no weight loss. No weakness. No fatigue. No vision changes. No hearing loss. No cough. No congestion. The patient denies any orthopnea, nausea, or vomiting. No diarrhea. No melena.  No stomach pain. The patient has no skin rashes. No trouble urinating. The patient has left hip pain; does have history of anxiety and depression.   PHYSICAL EXAMINATION: VITAL SIGNS: Temperature is 100.3, at 4:30, and 99.9; blood pressure 119/75, heart rate is around 90, sats 99% on 2 liters.  GENERAL: She is alert, awake, oriented, elderly female, not in distress, answering questions appropriately.  HEENT: Atraumatic, normocephalic.  EYES: Pupils equally reacting to light.  ENT: No tympanic membrane congestion. No turbinate hypertrophy.   ( no oropharyngeal  eythema, NECK: Supple. No JVD. No carotid bruit.  LUNGS: Bilaterally clear to auscultation. No wheeze. No rales.  CARDIOVASCULAR: S1, S2 regular. No murmurs. The patient is slightly tachycardic. PMI not displaced. Peripheral pulses are intact. JVD  is normal. The patient has carotid upstroke, does not have any more bruits, no chest wall tenderness.  ABDOMEN: Soft, nontender, nondistended, bowel sounds present.  EXTREMITIES: No  extremity edema, no cyanosis, no clubbing.  NEUROLOGIC: The patient is alert, awake, oriented to time, place, person; cranial nerves II through XII intact; sensation intact. DTRs 2+ bilaterally. The patient does have a left hip surgery, so power is not assessed.  LABORATORY DATA: CBC: 9.7. Electrolytes: Sodium is 140, potassium is 4, chloride 105; bicarbonate 28, BUN is 10, creatinine 0.9, glucose 121. Hemoglobin 8.9.   EKG shows sinus tachycardia at 117 beats minutes, no ST-T changes.   ASSESSMENT AND PLAN: 1.  This patient is a 59 year old female with sinus tachycardia, likely secondary to pain and needs to be evaluated for any sepsis. We will continue pain medications that you are doing and increase the Cardizem CD to 180 mg daily.  2.  Mild low grade fever with tachycardia. Evaluate for sepsis. Check blood cultures, urine cultures, and chest x-ray.  3.  Premature ventricular contractions on electrocardiogram, but her potassium is normal. The patient continues to have poor oral intake so we will recommend IV fluids with normal saline with potassium at 60 mL/h.  4.  Patient has history of pulmonary embolism and deep vein thrombosis status post an inferior venae cavae filter. She is already on Lovenox at 30 mg every 12 hours and she is not hypoxic, so pulmonary embolism is unlikely. Continue to monitor oxygen saturation.   TIME SPENT ON THE CONSULTATION:  About 60 minutes.   Thanks for asking [place] to see this patient.    ____________________________ Epifanio Lesches, MD sk:nt D: 04/07/2014 18:52:27 ET T: 04/07/2014 21:15:55 ET JOB#: [id number]  cc: Epifanio Lesches, MD, <Dictator> Epifanio Lesches MD ELECTRONICALLY SIGNED 05/10/2014 11:04

## 2014-12-23 NOTE — Op Note (Signed)
PATIENT NAMESONNI, [name] MR#:  [id number] DATE OF BIRTH:  Jan 11, 1956  DATE OF PROCEDURE:  03/28/2014  PREOPERATIVE DIAGNOSES: 1. History of deep vein thrombosis with pulmonary embolism.  2. Degenerative joint disease requiring hip replacement.  POSTOPERATIVE DIAGNOSES: 1. History of deep vein thrombosis with pulmonary embolism.  2. Degenerative joint disease requiring hip replacement.  PROCEDURE PERFORMED: 1. Inferior venacavogram.  2. Placement of infrarenal inferior vena caval filter, [place] type.   SURGEON: Katha Cabal, M.D.   SEDATION: Versed 3 mg plus fentanyl 100 mcg administered IV. Continuous ECG, pulse oximetry and cardiopulmonary monitoring was performed throughout the entire procedure by the interventional radiology nurse. Total sedation time was 30 minutes.  ACCESS: Right common femoral vein.   FLUOROSCOPY TIME: Less than 1 minute.   CONTRAST USED: Isovue 15 mL.   INDICATIONS: Valerie Vazquez is a 59 year old woman, who will require hip replacement. She has a history of DVT and PE, and, therefore, will be required to be off her anticoagulation. She is undergoing placement of an IVC filter to prevent lethal pulmonary embolism in the perioperative period. The risks and benefits have been reviewed. All questions answered. Alternative therapies discussed. The patient has agreed to proceed.   DESCRIPTION OF PROCEDURE: The patient was taken to special procedures and placed in the supine position. After adequate sedation is achieved, both groins are prepped and draped in sterile fashion. Ultrasound is placed in a sterile sleeve. Ultrasound is utilized secondary to lack of appropriate landmarks and to avoid vascular injury. Under direct visualization, the femoral vein on the right is identified. It is echolucent and compressible indicating patency. Image is recorded for the permanent record. With real-time visualization, a micropuncture needle was inserted after 1% lidocaine is  infiltrated. Microwire followed by micro-sheath is placed. J-wire is then advanced followed by the delivery sheath.   The delivery sheath is positioned with the markers at the iliac vein confluence and bolus injection of contrast is used to demonstrate the inferior vena cava. The cava is then measured and is 23 mm in diameter, adequate for filter placement. Renal blushes are at the L1 level and the wire is reintroduced. The catheter is positioned at the L2 level, and the filter is deployed without difficulty. There are no immediate complications. The sheath is pulled, pressure is held.    ____________________________ Katha Cabal, MD ggs:jr D: 03/28/2014 12:32:28 ET T: 03/28/2014 13:16:48 ET JOB#: [id number]  cc: Katha Cabal, MD, <Dictator> Laurene Footman, MD [name], MD Katha Cabal MD ELECTRONICALLY SIGNED 04/11/2014 17:15

## 2014-12-23 NOTE — Discharge Summary (Signed)
PATIENT NAMEREWA, Vazquez MR#:  [id number] DATE OF BIRTH:  [birth year]  DATE OF ADMISSION:  04/06/2014 DATE OF DISCHARGE:     ADMITTING DIAGNOSIS: Left hip severe osteoarthritis.   DISCHARGE DIAGNOSIS: Left hip severe osteoarthritis.   PROCEDURE: Left total hip replacement.   ANESTHESIA: Spinal.   SURGEON: Dr. Rudene Christians.   ASSISTANT: Reche Dixon, PA-C.   ESTIMATED BLOOD LOSS: 850 mL with significant oozing from the acetabular and femoral bone.   COMPLICATIONS: There were no complications.   SPECIMEN: Removed the femoral head.   CONDITION: To recovery room, stable.   HISTORY: The patient is a 59 year old female who failed conservative measures including nonsteroidal anti-inflammatory medications, Tylenol, ice heat, and activity modification and treatment for left hip osteoarthritis. X-rays from 12/31/2013 showed severe degenerative joint disease. She has agreed to proceed with left total hip arthroplasty with Dr. Rudene Christians on 04/06/2014. She has no paresthesias, numbness or weakness.   PHYSICAL EXAMINATION:  GENERAL: Well developed, well nourished female alert and oriented x 3, in no acute distress.  NEUROLOGIC: Sensation intact to the left lower extremity.  HEENT: Normocephalic, atraumatic. Pupils equal, round, and reactive to light.  CARDIOVASCULAR: Heart, regular rate and rhythm.  RESPIRATORY: Lungs clear to auscultation bilaterally. No wheezing, rales or rhonchi.  ABDOMEN: Soft, nontender, nondistended. Bowel sounds positive. EXTREMITIES: No hip gross deformity. Gait mildly antalgic. left leg Range of motion sitting, no pain with external rotation and internal rotation. Range of motion is equal and full and painless. She has tenderness to palpation of the groin. Strength is 3 to 5 with straight leg raise. She has knee flexion 5 out of 5, knee extension 5 out of 5, ankle dorsiflexion 5 out of 5, ankle plantar flexion 5 out of 5. She is neurovascularly intact to the right lower extremity  and left lower extremity.   HOSPITAL COURSE: The patient was admitted to the hospital on 04/06/2014. She had surgery that same day and was brought to the orthopedic floor from the PACU in stable condition. On postoperative day 1, the patient had acute postoperative blood loss anemia with a hemoglobin of 8.9. She made good progress with physical therapy. On postoperative day 3, she did drop to hemoglobin of 7.7 and this was increased on postoperative day 4, to 8.1. On postoperative day 2, she was found to have pneumonia and was started on Levaquin 750 mg daily. Medicine got started on 10-day dosage of antibiotics. She did have some high fevers, and by postoperative day 4, she is only having low-grade fevers, vital signs are stable, and she was not complaining of any chest pain or shortness of breath. She was tolerating physical therapy well. She was stable and ready for discharge to a rehabilitation facility for continuation of physical therapy and occupational therapy.   DISCHARGE INSTRUCTIONS: The patient may gradually increase weight-bearing on the affected extremityThigh-high TED hose on both legs and remove 1 hour per 8 hour shift. Elevate the heels off the bed. Use incentive spirometer every hour while awake and encourage cough and deep breathing. The patient may resume a regular diet as tolerated. Apply an ice pack to the affected area. Do not get the dressing or bandage wet or dirty. Call [facility] if the dressing gets water under it. Leave the dressing on. The patient may shower, do not get it under the dressing. Call [facility] if there is any red bleeding from the incision wound, fever above 101.5 degrees, redness, swelling, drainage at the incision site.  Call [facility] if you experience any increased leg pain, numbness or weakness in your legs or bowel or bladder symptoms. Please take Levaquin 750 mg 1 tablet p.o. daily x 7 days for pneumonia. Call  [facility] for a follow-up appointment. She should follow up in 2 weeks.   DISCHARGE MEDICATIONS: Please see list of discharge medications on discharge instructions.    ____________________________ T. Rachelle Hora, PA-C tcg:jr D: 04/10/2014 11:12:44 ET T: 04/10/2014 11:23:40 ET JOB#: [id number]  cc: T. Rachelle Hora, PA-C, <Dictator> Duanne Teutsch [place] ELECTRONICALLY SIGNED 04/15/2014 0:11

## 2015-01-07 NOTE — Progress Notes (Signed)
Cardiology Office Note   Date:  01/08/2015   ID:  Valerie Vazquez, DOB 04-14-1956, MRN [id number]  PCP:  [name], MD  Cardiologist:  Dr. Lauree Chandler     Chief Complaint  Patient presents with  . Hospitalization Follow-up    Chest Pain probably related to vasospasm     History of Present Illness: Valerie Vazquez is a 59 y.o. female with a hx of schizoaffective disorder, DM, HTN, diastolic CHF and multiple episodes of PE/DVT now on Eliquis with IVC filter. She was admitted to the hospital on 04/09-04/12 with chest pain. Her initial ECG was normal, but she had dynamic ECG changes with chest pain and code STEMI was called. She was taken to the Cath Lab.  LHC demonstrated no significant CAD.  CEs remained neg. She was started on Diltiazem and Imdur for possible vasospasm.  She returns for FU.  She is here with her husband. She denies any recurrent chest pain. She does note significant dyspnea with exertion. She reports NYHA 3 symptoms. She denies orthopnea or PND. She denies significant LE edema. She denies syncope. Last night she did have an episode of palpitations. She felt strange with this. Her last about 30-60 minutes. Her heart rate did feel rapid.    Studies/Reports Reviewed Today:  Echo 12/10/14 - Mild concentric hypertrophy. EF 55% to 60%. Wall motion was normal.  Grade 1 diastolic dysfunction. - Aortic valve: Trileaflet; normal thickness leaflets. There was no regurgitation. - Mitral valve: Structurally normal valve. There was no regurgitation. - Left atrium: The atrium was mildly dilated. - Right ventricle: Systolic function was normal. - Right atrium: The atrium was normal in size. - Tricuspid valve: There was mild regurgitation. - Pulmonic valve: There was no regurgitation. - Pulmonary arteries: Systolic pressure was within the normal range. - Inferior vena cava: The vessel was normal in size. - Pericardium, extracardiac: There was no  pericardial effusion.  LHC 12/09/14 Left main: No obstructive disease Left Anterior Descending Artery: No obstructive disease.   Circumflex Artery: Large caliber dominant vessel with large first obtuse marginal branch, moderate caliber left posterolateral branch.   Right Coronary Artery: Small to moderate caliber non-dominant vessel with no obstructive disease.  Left Ventricular Angiogram: LVEF=50%.  Impression: 1. No angiographic evidence of CAD 2. Low normal LV systolic function 3. Chest pain with EKG changes, possibly representing coronary vasospasm   Past Medical History  Diagnosis Date  . Spinal stenosis   . Hypertension   . Pulmonary embolism 04/2001; 09/19/2014    "after gallbladder OR; "  . High cholesterol   . Myocardial infarction 10/2010 X 3    "while hospitalized"  . Pneumonia 04/2014  . Sleep apnea     "mild" (09/19/2014)  . Type II diabetes mellitus     "dx'd in 2007; lost weight; no RX for ~ 4 yr now" (09/19/2014)  . Anemia   . History of blood transfusion 1985    related to hysterectomy  . Esophageal spasm   . Gastritis   . Gastroenteritis   . IBS (irritable bowel syndrome)   . GERD (gastroesophageal reflux disease)   . Headache     "couple /month lately" (09/19/2014)  . Migraine     "a few times/yr" (09/19/2014)  . Osteoarthritis     "qwhere; mostly around my joints" (09/19/2014)  . Spondylosis   . Chronic back pain     "upper and lower" (09/19/2014)  . Depression     "major" (09/19/2014)  .  PTSD (post-traumatic stress disorder)   . Schizoaffective disorder     Past Surgical History  Procedure Laterality Date  . Excision/release bursa hip Right 12/1984  . Total hip arthroplasty Left 04-06-2014  . Tonsillectomy and adenoidectomy  ~ 1968  . Appendectomy  1985  . Abdominal hysterectomy  1985  . Cholecystectomy  2002  . Hernia repair  1985  . Umbilical hernia repair  1985  . Vena cava filter placement  03/2014  . Back surgery    . Anterior cervical  decomp/discectomy fusion  10/2012  . Breast cyst excision Right   . Cardiac catheterization   2000's; 2009  . Left heart catheterization with coronary angiogram N/A 12/09/2014    Procedure: LEFT HEART CATHETERIZATION WITH CORONARY ANGIOGRAM;  Surgeon: Burnell Blanks, MD;  Location: [facility] CATH LAB;  Service: Cardiovascular;  Laterality: N/A;     Current Outpatient Prescriptions  Medication Sig Dispense Refill  . apixaban (ELIQUIS) 5 MG TABS tablet Take 1 tablet (5 mg total) by mouth 2 (two) times daily. 60 tablet 3  . cyclobenzaprine (FLEXERIL) 10 MG tablet Take 10 mg by mouth every 8 (eight) hours.    Marland Kitchen dicyclomine (BENTYL) 10 MG capsule Take 20 mg by mouth every 6 (six) hours.    Marland Kitchen diltiazem (CARDIZEM CD) 120 MG 24 hr capsule Take 1 capsule (120 mg total) by mouth daily. 30 capsule 3  . diphenhydrAMINE (BENADRYL) 25 MG tablet Take 50 mg by mouth at bedtime.    . furosemide (LASIX) 20 MG tablet Take 20 mg by mouth daily as needed for fluid or edema.    . gabapentin (NEURONTIN) 600 MG tablet Take 600 mg by mouth every 8 (eight) hours.     . isosorbide mononitrate (IMDUR) 30 MG 24 hr tablet Take 0.5 tablets (15 mg total) by mouth daily. 30 tablet 11  . loratadine (CLARITIN) 10 MG tablet Take 10 mg by mouth daily.    . Menthol-Camphor (ICY HOT ARTHRITIS PAIN RELIEF) 16-4 % LOTN Apply 1 application topically daily as needed (for pain).    . metFORMIN (GLUCOPHAGE) 500 MG tablet Take 1 tablet (500 mg total) by mouth daily with breakfast. 30 tablet 1  . Multiple Vitamin (MULTIVITAMIN WITH MINERALS) TABS tablet Take 1 tablet by mouth daily.    . ondansetron (ZOFRAN) 4 MG tablet Take 1 tablet (4 mg total) by mouth every 6 (six) hours as needed for nausea or vomiting. 12 tablet 0  . oxyCODONE-acetaminophen (PERCOCET) 10-325 MG per tablet Take 1 tablet by mouth every 6 (six) hours as needed for pain.    . polyethylene glycol (MIRALAX / GLYCOLAX) packet Take 17 g by mouth daily. 14 each 0  .  Sennosides (EX-LAX PO) Take 2 tablets by mouth 2 (two) times daily.    . traZODone (DESYREL) 100 MG tablet Take 50 mg by mouth at bedtime as needed for sleep.     . carvedilol (COREG) 3.125 MG tablet Take 1 tablet (3.125 mg total) by mouth 2 (two) times daily with a meal. 180 tablet 3   No current facility-administered medications for this visit.    Allergies:   Abilify; Augmentin; Bactrim; Ceftin; Diclofenac; Indocin; Linaclotide; Lisinopril; Morphine; Morphine and related; Penicillins; Simvastatin; Sulfa antibiotics; Sulfamethoxazole-trimethoprim; Wellbutrin; and Quetiapine fumarate    Social History:  The patient  reports that she quit smoking about 35 years ago. Her smoking use included Cigarettes. She has a 15 pack-year smoking history. She has never used smokeless tobacco. She reports that she drinks  alcohol. She reports that she does not use illicit drugs.   Family History:  The patient's family history includes Stroke in her mother. There is no history of Heart attack.    ROS:   Please see the history of present illness.   Review of Systems  Constitution: Positive for decreased appetite, malaise/fatigue and weight gain.  HENT: Positive for headaches and hearing loss.   Eyes: Positive for visual disturbance.  Cardiovascular: Positive for dyspnea on exertion, irregular heartbeat and leg swelling.  Respiratory: Positive for snoring.   Hematologic/Lymphatic: Bruises/bleeds easily.  Musculoskeletal: Positive for back pain, joint pain, joint swelling and myalgias.  Gastrointestinal: Positive for abdominal pain, constipation and nausea.  Genitourinary: Positive for dysuria.  Neurological: Positive for dizziness and loss of balance.  All other systems reviewed and are negative.     PHYSICAL EXAM: VS:  BP 130/80 mmHg  Pulse 75  Ht 5\' 6"  (1.676 m)  Wt 205 lb (92.987 kg)  BMI 33.10 kg/m2    Wt Readings from Last 3 Encounters:  01/08/15 205 lb (92.987 kg)  12/09/14 203 lb 0.7 oz  (92.1 kg)  10/28/14 208 lb (94.348 kg)     GEN: Well nourished, well developed, in no acute distress HEENT: normal Neck: no JVD no masses Cardiac:  Normal S1/S2, RRR; no murmur ,  no rubs or gallops, no edema ; right wrist without hematoma or mass  Respiratory:  clear to auscultation bilaterally, no wheezing, rhonchi or rales. GI: soft, nontender, nondistended, + BS MS: no deformity or atrophy Skin: warm and dry  Neuro:  CNs II-XII intact, Strength and sensation are intact Psych: Normal affect   EKG:  EKG is ordered today.  It demonstrates:   NSR, HR 75, normal axis, inferolateral T-wave inversions   Recent Labs: 09/19/2014: TSH 2.596 12/09/2014: ALT 26; B Natriuretic Peptide 90.6 12/12/2014: Hemoglobin 11.1*; Platelets 353 01/08/2015: BUN 11; Creatinine 0.88; Potassium 4.9; Pro B Natriuretic peptide (BNP) 49.0; Sodium 137    Lipid Panel No results found for: CHOL, TRIG, HDL, CHOLHDL, VLDL, LDLCALC, LDLDIRECT    ASSESSMENT AND PLAN:  Coronary vasospasm No recurrent chest pain. She seems to be tolerating diltiazem and isosorbide. Continue current therapy.  Essential hypertension, benign Fair control.  HLD (hyperlipidemia)  managed by primary care.  Pulmonary embolism  She remains on Eliquis. This is managed by primary care.  Dysuria She has frequent UTIs. She has recently been treated by primary care. I have asked to follow-up with primary care today to have a urinalysis.  Dyspnea Etiology not clear. She had normal LV function by recent echocardiogram. She does have diastolic dysfunction. She tells me that she has a history of diastolic heart failure. She is not volume overloaded on exam. I will check a BMET, BNP. If BNP is elevated, increase Lasix.  Palpitations Recommend treatment for her UTI with primary care. If she has recurrent palpitations, consider event monitor. Of note, recent TSH normal.  Snoring She has a history of mild sleep apnea. She never completed the  CPAP titration. Arrange split night sleep study.   Current medicines are reviewed at length with the patient today.  Concerns regarding medicines are as outlined above.  The following changes have been made:    Decrease Coreg to 3.125 mg twice a day   Labs/ tests ordered today include:  Orders Placed This Encounter  Procedures  . Basic Metabolic Panel (BMET)  . B Nat Peptide  . EKG 12-Lead  . Split night study  Disposition:   FU with Dr. Angelena Form 3 months  Signed, Richardson Dopp, PA-C, MHS 01/08/2015 5:59 PM    [facility] [address], [place], [place]  [address] Phone: [phone]; Fax: [phone]

## 2015-01-08 ENCOUNTER — Encounter: Payer: Self-pay | Admitting: Physician Assistant

## 2015-01-08 ENCOUNTER — Ambulatory Visit (INDEPENDENT_AMBULATORY_CARE_PROVIDER_SITE_OTHER): Payer: Medicare Other | Admitting: Physician Assistant

## 2015-01-08 VITALS — BP 130/80 | HR 75 | Ht 66.0 in | Wt 205.0 lb

## 2015-01-08 DIAGNOSIS — I201 Angina pectoris with documented spasm: Secondary | ICD-10-CM | POA: Diagnosis not present

## 2015-01-08 DIAGNOSIS — I2699 Other pulmonary embolism without acute cor pulmonale: Secondary | ICD-10-CM

## 2015-01-08 DIAGNOSIS — R0602 Shortness of breath: Secondary | ICD-10-CM | POA: Diagnosis not present

## 2015-01-08 DIAGNOSIS — R0683 Snoring: Secondary | ICD-10-CM

## 2015-01-08 DIAGNOSIS — R002 Palpitations: Secondary | ICD-10-CM

## 2015-01-08 DIAGNOSIS — E785 Hyperlipidemia, unspecified: Secondary | ICD-10-CM

## 2015-01-08 DIAGNOSIS — R300 Dysuria: Secondary | ICD-10-CM

## 2015-01-08 DIAGNOSIS — R5383 Other fatigue: Secondary | ICD-10-CM

## 2015-01-08 DIAGNOSIS — I10 Essential (primary) hypertension: Secondary | ICD-10-CM

## 2015-01-08 LAB — BASIC METABOLIC PANEL
BUN: 11 mg/dL (ref 6–23)
CO2: 34 mEq/L — ABNORMAL HIGH (ref 19–32)
Calcium: 9.8 mg/dL (ref 8.4–10.5)
Chloride: 102 mEq/L (ref 96–112)
Creatinine, Ser: 0.88 mg/dL (ref 0.40–1.20)
GFR: 84.08 mL/min (ref >60.00)
Glucose, Bld: 111 mg/dL — ABNORMAL HIGH (ref 70–99)
Potassium: 4.9 mEq/L (ref 3.5–5.1)
Sodium: 137 mEq/L (ref 135–145)

## 2015-01-08 LAB — BRAIN NATRIURETIC PEPTIDE: Pro B Natriuretic peptide (BNP): 49 pg/mL (ref 0.0–100.0)

## 2015-01-08 MED ORDER — CARVEDILOL 3.125 MG PO TABS: 3.1250 mg | ORAL_TABLET | Freq: Two times a day (BID) | ORAL | Status: DC

## 2015-01-08 NOTE — Patient Instructions (Signed)
Medication Instructions:  1) DECREASE Coreg to 3.125mg  twice daily  Labwork: Your physician recommends that you have lab work: today (BMET, BNP)   Testing/Procedures: Your physician has recommended that you have a split night sleep study. This test records several body functions during sleep, including: brain activity, eye movement, oxygen and carbon dioxide blood levels, heart rate and rhythm, breathing rate and rhythm, the flow of air through your mouth and nose, snoring, body muscle movements, and chest and belly movement.   Follow-Up: Your physician recommends that you schedule a follow-up appointment in: 3 months with Dr. Angelena Form.   Any Other Special Instructions Will Be Listed Below (If Applicable).  Your physician recommends that you follow up with your Primary Care doctor today for possible UTI.

## 2015-01-09 ENCOUNTER — Telehealth: Payer: Self-pay | Admitting: *Deleted

## 2015-01-09 NOTE — Telephone Encounter (Signed)
Pt notified about lab results with verbal understanding 

## 2015-01-11 DIAGNOSIS — G4730 Sleep apnea, unspecified: Secondary | ICD-10-CM | POA: Diagnosis not present

## 2015-01-11 DIAGNOSIS — R300 Dysuria: Secondary | ICD-10-CM | POA: Diagnosis not present

## 2015-01-11 DIAGNOSIS — K5909 Other constipation: Secondary | ICD-10-CM | POA: Diagnosis not present

## 2015-01-11 DIAGNOSIS — T402X5A Adverse effect of other opioids, initial encounter: Secondary | ICD-10-CM | POA: Diagnosis not present

## 2015-01-16 ENCOUNTER — Telehealth: Payer: Self-pay | Admitting: Cardiovascular Disease

## 2015-01-16 ENCOUNTER — Encounter: Payer: Self-pay | Admitting: Physician Assistant

## 2015-01-16 DIAGNOSIS — H9191 Unspecified hearing loss, right ear: Secondary | ICD-10-CM | POA: Diagnosis not present

## 2015-01-16 DIAGNOSIS — Z8619 Personal history of other infectious and parasitic diseases: Secondary | ICD-10-CM | POA: Diagnosis not present

## 2015-01-16 DIAGNOSIS — Z124 Encounter for screening for malignant neoplasm of cervix: Secondary | ICD-10-CM | POA: Diagnosis not present

## 2015-01-16 DIAGNOSIS — Z86718 Personal history of other venous thrombosis and embolism: Secondary | ICD-10-CM | POA: Diagnosis not present

## 2015-01-16 DIAGNOSIS — T402X5A Adverse effect of other opioids, initial encounter: Secondary | ICD-10-CM | POA: Diagnosis not present

## 2015-01-16 DIAGNOSIS — K5909 Other constipation: Secondary | ICD-10-CM | POA: Diagnosis not present

## 2015-01-16 DIAGNOSIS — Z0000 Encounter for general adult medical examination without abnormal findings: Secondary | ICD-10-CM | POA: Diagnosis not present

## 2015-01-16 DIAGNOSIS — N3000 Acute cystitis without hematuria: Secondary | ICD-10-CM | POA: Diagnosis not present

## 2015-01-16 DIAGNOSIS — Z8742 Personal history of other diseases of the female genital tract: Secondary | ICD-10-CM | POA: Diagnosis not present

## 2015-01-16 DIAGNOSIS — Z1239 Encounter for other screening for malignant neoplasm of breast: Secondary | ICD-10-CM | POA: Diagnosis not present

## 2015-01-16 NOTE — Telephone Encounter (Signed)
New Message    Patient needs to know the result of her sleep study that was done last week by an outside facility called [facility]. She was told from them that the results were sent to her cardiologist.

## 2015-01-16 NOTE — Telephone Encounter (Signed)
Pat, Do you have any idea where this report is located? Scott saw her and ordered it. Thanks, chris

## 2015-01-17 NOTE — Telephone Encounter (Signed)
I just saw it yesterday.  They faxed the report. I believe it was normal.  I signed off on it yesterday.  Arbie Cookey can you call patient with results. Richardson Dopp, PA-C   01/17/2015 9:53 PM

## 2015-01-18 NOTE — Telephone Encounter (Signed)
Spoke with pt and reviewed sleep study results with her.

## 2015-01-18 NOTE — Telephone Encounter (Signed)
Left message to call back  

## 2015-01-18 NOTE — Telephone Encounter (Signed)
Follow up      Returning Pat's call

## 2015-02-02 ENCOUNTER — Telehealth: Payer: Self-pay | Admitting: Family Medicine

## 2015-02-02 ENCOUNTER — Telehealth: Payer: Self-pay

## 2015-02-02 DIAGNOSIS — M2550 Pain in unspecified joint: Principal | ICD-10-CM

## 2015-02-02 DIAGNOSIS — G8929 Other chronic pain: Secondary | ICD-10-CM

## 2015-02-02 MED ORDER — CYCLOBENZAPRINE HCL 10 MG PO TABS: 10.0000 mg | ORAL_TABLET | Freq: Three times a day (TID) | ORAL | Status: DC

## 2015-02-02 NOTE — Telephone Encounter (Signed)
Pt is requesting a refill on Cyclobenzaprine. Please send to [facility]

## 2015-02-02 NOTE — Telephone Encounter (Signed)
Patient is requesting a refill of her flexeril. Request was placed at pharmacy but patient stated it has not been filled

## 2015-02-02 NOTE — Telephone Encounter (Signed)
Rx sent in to pharmacy. 

## 2015-02-02 NOTE — Addendum Note (Signed)
Addended by: [name] on: 02/02/2015 12:55 PM   Modules accepted: Orders, Medications

## 2015-02-08 ENCOUNTER — Emergency Department (HOSPITAL_COMMUNITY): Payer: Medicare Other

## 2015-02-08 ENCOUNTER — Encounter (HOSPITAL_COMMUNITY): Payer: Self-pay | Admitting: Cardiology

## 2015-02-08 ENCOUNTER — Emergency Department (HOSPITAL_COMMUNITY)
Admission: EM | Admit: 2015-02-08 | Discharge: 2015-02-08 | Disposition: A | Discharge status: Home / Self Care | Payer: Medicare Other | Attending: Emergency Medicine | Admitting: Emergency Medicine

## 2015-02-08 DIAGNOSIS — Z79899 Other long term (current) drug therapy: Secondary | ICD-10-CM | POA: Insufficient documentation

## 2015-02-08 DIAGNOSIS — Z90710 Acquired absence of both cervix and uterus: Secondary | ICD-10-CM | POA: Insufficient documentation

## 2015-02-08 DIAGNOSIS — G43909 Migraine, unspecified, not intractable, without status migrainosus: Secondary | ICD-10-CM | POA: Insufficient documentation

## 2015-02-08 DIAGNOSIS — Z87891 Personal history of nicotine dependence: Secondary | ICD-10-CM | POA: Diagnosis not present

## 2015-02-08 DIAGNOSIS — Z9049 Acquired absence of other specified parts of digestive tract: Secondary | ICD-10-CM | POA: Diagnosis not present

## 2015-02-08 DIAGNOSIS — G8929 Other chronic pain: Secondary | ICD-10-CM | POA: Insufficient documentation

## 2015-02-08 DIAGNOSIS — I252 Old myocardial infarction: Secondary | ICD-10-CM | POA: Diagnosis not present

## 2015-02-08 DIAGNOSIS — K5900 Constipation, unspecified: Secondary | ICD-10-CM | POA: Diagnosis not present

## 2015-02-08 DIAGNOSIS — R109 Unspecified abdominal pain: Secondary | ICD-10-CM | POA: Diagnosis present

## 2015-02-08 DIAGNOSIS — M1990 Unspecified osteoarthritis, unspecified site: Secondary | ICD-10-CM | POA: Diagnosis not present

## 2015-02-08 DIAGNOSIS — I10 Essential (primary) hypertension: Secondary | ICD-10-CM | POA: Insufficient documentation

## 2015-02-08 DIAGNOSIS — Z8659 Personal history of other mental and behavioral disorders: Secondary | ICD-10-CM | POA: Insufficient documentation

## 2015-02-08 DIAGNOSIS — Z8639 Personal history of other endocrine, nutritional and metabolic disease: Secondary | ICD-10-CM | POA: Diagnosis not present

## 2015-02-08 DIAGNOSIS — R110 Nausea: Secondary | ICD-10-CM | POA: Diagnosis not present

## 2015-02-08 DIAGNOSIS — Z862 Personal history of diseases of the blood and blood-forming organs and certain disorders involving the immune mechanism: Secondary | ICD-10-CM | POA: Diagnosis not present

## 2015-02-08 DIAGNOSIS — R3919 Other difficulties with micturition: Secondary | ICD-10-CM | POA: Insufficient documentation

## 2015-02-08 DIAGNOSIS — K589 Irritable bowel syndrome without diarrhea: Secondary | ICD-10-CM | POA: Diagnosis not present

## 2015-02-08 DIAGNOSIS — Z8701 Personal history of pneumonia (recurrent): Secondary | ICD-10-CM | POA: Insufficient documentation

## 2015-02-08 DIAGNOSIS — Z86711 Personal history of pulmonary embolism: Secondary | ICD-10-CM | POA: Insufficient documentation

## 2015-02-08 DIAGNOSIS — E119 Type 2 diabetes mellitus without complications: Secondary | ICD-10-CM | POA: Diagnosis not present

## 2015-02-08 DIAGNOSIS — Z7901 Long term (current) use of anticoagulants: Secondary | ICD-10-CM | POA: Insufficient documentation

## 2015-02-08 DIAGNOSIS — Z9889 Other specified postprocedural states: Secondary | ICD-10-CM | POA: Insufficient documentation

## 2015-02-08 DIAGNOSIS — Z880 Allergy status to penicillin: Secondary | ICD-10-CM | POA: Insufficient documentation

## 2015-02-08 LAB — COMPREHENSIVE METABOLIC PANEL
ALT: 21 U/L (ref 14–54)
AST: 26 U/L (ref 15–41)
Albumin: 3.8 g/dL (ref 3.5–5.0)
Alkaline Phosphatase: 98 U/L (ref 38–126)
Anion gap: 9 (ref 5–15)
BUN: 9 mg/dL (ref 6–20)
CO2: 27 mmol/L (ref 22–32)
Calcium: 9.3 mg/dL (ref 8.9–10.3)
Chloride: 103 mmol/L (ref 101–111)
Creatinine, Ser: 0.89 mg/dL (ref 0.44–1.00)
GFR calc Af Amer: >60 mL/min (ref >60)
GFR calc non Af Amer: >60 mL/min (ref >60)
Glucose, Bld: 99 mg/dL (ref 65–99)
Potassium: 4.2 mmol/L (ref 3.5–5.1)
Sodium: 139 mmol/L (ref 135–145)
Total Bilirubin: 0.4 mg/dL (ref 0.3–1.2)
Total Protein: 7.1 g/dL (ref 6.5–8.1)

## 2015-02-08 LAB — URINALYSIS, ROUTINE W REFLEX MICROSCOPIC
Bilirubin Urine: NEGATIVE
Glucose, UA: NEGATIVE mg/dL
Hgb urine dipstick: NEGATIVE
Ketones, ur: 15 mg/dL — AB
Leukocytes, UA: NEGATIVE
Nitrite: NEGATIVE
Protein, ur: NEGATIVE mg/dL
Specific Gravity, Urine: 1.035 — ABNORMAL HIGH (ref 1.005–1.030)
Urobilinogen, UA: 1 mg/dL (ref 0.0–1.0)
pH: 5 (ref 5.0–8.0)

## 2015-02-08 LAB — CBC WITH DIFFERENTIAL/PLATELET
Basophils Absolute: 0 10*3/uL (ref 0.0–0.1)
Basophils Relative: 1 % (ref 0–1)
Eosinophils Absolute: 0.3 10*3/uL (ref 0.0–0.7)
Eosinophils Relative: 5 % (ref 0–5)
HCT: 33.1 % — ABNORMAL LOW (ref 36.0–46.0)
Hemoglobin: 10.7 g/dL — ABNORMAL LOW (ref 12.0–15.0)
Lymphocytes Relative: 44 % (ref 12–46)
Lymphs Abs: 2.6 10*3/uL (ref 0.7–4.0)
MCH: 28.5 pg (ref 26.0–34.0)
MCHC: 32.3 g/dL (ref 30.0–36.0)
MCV: 88 fL (ref 78.0–100.0)
Monocytes Absolute: 0.3 10*3/uL (ref 0.1–1.0)
Monocytes Relative: 6 % (ref 3–12)
Neutro Abs: 2.6 10*3/uL (ref 1.7–7.7)
Neutrophils Relative %: 44 % (ref 43–77)
Platelets: 330 10*3/uL (ref 150–400)
RBC: 3.76 MIL/uL — ABNORMAL LOW (ref 3.87–5.11)
RDW: 15.2 % (ref 11.5–15.5)
WBC: 5.9 10*3/uL (ref 4.0–10.5)

## 2015-02-08 MED ORDER — FLEET ENEMA 7-19 GM/118ML RE ENEM
1.0000 | ENEMA | Freq: Once | RECTAL | Status: AC
  Administered 2015-02-08: 1 via RECTAL
  Filled 2015-02-08: qty 1

## 2015-02-08 MED ORDER — SORBITOL 70 % SOLN
960.0000 mL | TOPICAL_OIL | Freq: Once | ORAL | Status: AC
  Administered 2015-02-08: 960 mL via RECTAL
  Filled 2015-02-08: qty 240

## 2015-02-08 MED ORDER — ONDANSETRON 4 MG PO TBDP: 4.0000 mg | ORAL_TABLET | Freq: Once | ORAL | Status: DC

## 2015-02-08 MED ORDER — MAGNESIUM HYDROXIDE 400 MG/5ML PO SUSP: 15.0000 mL | Freq: Every day | ORAL | Status: DC | PRN

## 2015-02-08 NOTE — Discharge Instructions (Signed)
Constipation You can take over the counter milk of magnesia and a stool softener such as dulcolax.   Constipation is when a person:  Poops (has a bowel movement) less than 3 times a week.  Has a hard time pooping.  Has poop that is dry, hard, or bigger than normal. HOME CARE   Eat foods with a lot of fiber in them. This includes fruits, vegetables, beans, and whole grains such as brown rice.  Avoid fatty foods and foods with a lot of sugar. This includes french fries, hamburgers, cookies, candy, and soda.  If you are not getting enough fiber from food, take products with added fiber in them (supplements).  Drink enough fluid to keep your pee (urine) clear or pale yellow.  Exercise on a regular basis, or as told by your doctor.  Go to the restroom when you feel like you need to poop. Do not hold it.  Only take medicine as told by your doctor. Do not take medicines that help you poop (laxatives) without talking to your doctor first. GET HELP RIGHT AWAY IF:   You have bright red blood in your poop (stool).  Your constipation lasts more than 4 days or gets worse.  You have belly (abdominal) or butt (rectal) pain.  You have thin poop (as thin as a pencil).  You lose weight, and it cannot be explained. MAKE SURE YOU:   Understand these instructions.  Will watch your condition.  Will get help right away if you are not doing well or get worse. Document Released: 02/04/2008 Document Revised: 08/23/2013 Document Reviewed: 05/30/2013 [facility] Patient Information 2015 [place], [place]. This information is not intended to replace advice given to you by your health care provider. Make sure you discuss any questions you have with your health care provider.

## 2015-02-08 NOTE — ED Notes (Signed)
Family at bedside. 

## 2015-02-08 NOTE — ED Provider Notes (Signed)
CSN: [id number]     Arrival date & time 02/08/15  1512 History   First MD Initiated Contact with Patient 02/08/15 1546     Chief Complaint  Patient presents with  . Abdominal Pain     (Consider location/radiation/quality/duration/timing/severity/associated sxs/prior Treatment) Patient is a 59 y.o. female presenting with abdominal pain. The history is provided by the patient. No language interpreter was used.  Abdominal Pain Associated symptoms: constipation and nausea   Associated symptoms: no chills, no cough, no diarrhea, no fever and no vomiting   Valerie Vazquez is a 59 y.o female with a history of hypertension, PE and DVT (anticoagulated with eliquis), hyperlipidemia, MI, spinal stenosis, diabetes type 2, anemia, IBS, osteoarthritis, spondylosis, and chronic back pain who states she has had constipation for the past week. She states she sometimes gets this opiate-induced constipation since she is on Percocet and Vicodin for arthritis and back pain. She tried to give herself an enema yesterday without relief. She states she feels bloated. She states she feels increased abdominal distention and bloating but is able to pass gas. She states she has to push in order to urinate. Nothing makes it better or worse. She denies any history of small bowel obstruction. She denies any fever, chills, chest pain, shortness of breath, cough, abdominal pain, vomiting, diarrhea, dysuria, hematuria. Past Medical History  Diagnosis Date  . Spinal stenosis   . Hypertension   . Pulmonary embolism 04/2001; 09/19/2014    "after gallbladder OR; "  . High cholesterol   . Myocardial infarction 10/2010 X 3    "while hospitalized"  . Pneumonia 04/2014  . Sleep apnea     "mild" (09/19/2014)  . Type II diabetes mellitus     "dx'd in 2007; lost weight; no RX for ~ 4 yr now" (09/19/2014)  . Anemia   . History of blood transfusion 1985    related to hysterectomy  . Esophageal spasm   . Gastritis   . Gastroenteritis   .  IBS (irritable bowel syndrome)   . GERD (gastroesophageal reflux disease)   . Headache     "couple /month lately" (09/19/2014)  . Migraine     "a few times/yr" (09/19/2014)  . Osteoarthritis     "qwhere; mostly around my joints" (09/19/2014)  . Spondylosis   . Chronic back pain     "upper and lower" (09/19/2014)  . Depression     "major" (09/19/2014)  . PTSD (post-traumatic stress disorder)   . Schizoaffective disorder   . Snoring     a. sleep study 5/16: No OSA   Past Surgical History  Procedure Laterality Date  . Excision/release bursa hip Right 12/1984  . Total hip arthroplasty Left 04-06-2014  . Tonsillectomy and adenoidectomy  ~ 1968  . Appendectomy  1985  . Abdominal hysterectomy  1985  . Cholecystectomy  2002  . Hernia repair  1985  . Umbilical hernia repair  1985  . Vena cava filter placement  03/2014  . Back surgery    . Anterior cervical decomp/discectomy fusion  10/2012  . Breast cyst excision Right   . Cardiac catheterization   2000's; 2009  . Left heart catheterization with coronary angiogram N/A 12/09/2014    Procedure: LEFT HEART CATHETERIZATION WITH CORONARY ANGIOGRAM;  Surgeon: Burnell Blanks, MD;  Location: [facility] CATH LAB;  Service: Cardiovascular;  Laterality: N/A;   Family History  Problem Relation Age of Onset  . Heart attack Neg Hx   . Stroke Mother    History  Substance Use Topics  . Smoking status: Former Smoker -- 1.50 packs/day for 10 years    Types: Cigarettes    Quit date: 04/12/1979  . Smokeless tobacco: Never Used  . Alcohol Use: Yes     Comment: 09/19/2014 "nothing in the last 6 months"   OB History    No data available     Review of Systems  Constitutional: Negative for fever and chills.  Respiratory: Negative for cough.   Gastrointestinal: Positive for nausea, abdominal pain, constipation and abdominal distention. Negative for vomiting, diarrhea, blood in stool and rectal pain.  Genitourinary: Positive for difficulty urinating.   All other systems reviewed and are negative.     Allergies  Abilify; Augmentin; Bactrim; Ceftin; Diclofenac; Indocin; Linaclotide; Lisinopril; Morphine; Morphine and related; Penicillins; Simvastatin; Sulfa antibiotics; Sulfamethoxazole-trimethoprim; Wellbutrin; and Quetiapine fumarate  Home Medications   Prior to Admission medications   Medication Sig Start Date End Date Taking? Authorizing Provider  apixaban (ELIQUIS) 5 MG TABS tablet Take 1 tablet (5 mg total) by mouth 2 (two) times daily. 12/12/14  Yes Rhonda G Barrett, PA-C  carvedilol (COREG) 3.125 MG tablet Take 1 tablet (3.125 mg total) by mouth 2 (two) times daily with a meal. 01/08/15  Yes Scott T Kathlen Mody, PA-C  cyclobenzaprine (FLEXERIL) 10 MG tablet Take 1 tablet (10 mg total) by mouth every 8 (eight) hours. 02/02/15  Yes [name], MD  dicyclomine (BENTYL) 10 MG capsule Take 10 mg by mouth 4 (four) times daily as needed for spasms.    Yes Historical Provider, MD  diltiazem (CARDIZEM CD) 120 MG 24 hr capsule Take 1 capsule (120 mg total) by mouth daily. 12/12/14  Yes Rhonda G Barrett, PA-C  diphenhydrAMINE (BENADRYL) 25 MG tablet Take 25 mg by mouth every 8 (eight) hours as needed for allergies.    Yes Historical Provider, MD  furosemide (LASIX) 20 MG tablet Take 20 mg by mouth daily as needed for fluid or edema.    Historical Provider, MD  gabapentin (NEURONTIN) 600 MG tablet Take 600 mg by mouth every 8 (eight) hours.     Historical Provider, MD  isosorbide mononitrate (IMDUR) 30 MG 24 hr tablet Take 0.5 tablets (15 mg total) by mouth daily. 12/12/14   Rhonda G Barrett, PA-C  loratadine (CLARITIN) 10 MG tablet Take 10 mg by mouth daily.    Historical Provider, MD  Menthol-Camphor (ICY HOT ARTHRITIS PAIN RELIEF) 16-4 % LOTN Apply 1 application topically daily as needed (for pain).    Historical Provider, MD  metFORMIN (GLUCOPHAGE) 500 MG tablet Take 1 tablet (500 mg total) by mouth daily with breakfast. 09/24/14   Hosie Poisson, MD   Multiple Vitamin (MULTIVITAMIN WITH MINERALS) TABS tablet Take 1 tablet by mouth daily.    Historical Provider, MD  ondansetron (ZOFRAN) 4 MG tablet Take 1 tablet (4 mg total) by mouth every 6 (six) hours as needed for nausea or vomiting. 12/12/14   Evelene Croon Barrett, PA-C  oxyCODONE-acetaminophen (PERCOCET) 10-325 MG per tablet Take 1 tablet by mouth every 6 (six) hours as needed for pain.    Historical Provider, MD  polyethylene glycol (MIRALAX / GLYCOLAX) packet Take 17 g by mouth daily. 10/28/14   Clayton Bibles, PA-C  Sennosides (EX-LAX PO) Take 2 tablets by mouth 2 (two) times daily.    Historical Provider, MD  traZODone (DESYREL) 100 MG tablet Take 50 mg by mouth at bedtime as needed for sleep.     Historical Provider, MD   BP 130/65 mmHg  Pulse 74  Temp(Src) 97.8 F (36.6 C) (Oral)  Resp 16  Wt 205 lb (92.987 kg)  SpO2 94% Physical Exam  Constitutional: She is oriented to person, place, and time. She appears well-developed and well-nourished.  HENT:  Head: Normocephalic and atraumatic.  Eyes: Conjunctivae are normal.  Neck: Normal range of motion. Neck supple.  Cardiovascular: Normal rate, regular rhythm and normal heart sounds.   Pulmonary/Chest: Effort normal and breath sounds normal.  Abdominal: Soft. She exhibits distension. She exhibits no ascites and no mass. There is no tenderness. There is no rebound, no guarding and no CVA tenderness. No hernia.  Mildly distended.  Genitourinary:  Rectal exam: Chaperone presents.  No rectal fecal impaction.   Musculoskeletal: Normal range of motion.  Neurological: She is alert and oriented to person, place, and time.  Skin: Skin is warm and dry.  Nursing note and vitals reviewed.   ED Course  Procedures (including critical care time) Labs Review Labs Reviewed  CBC WITH DIFFERENTIAL/PLATELET - Abnormal; Notable for the following:    RBC 3.76 (*)    Hemoglobin 10.7 (*)    HCT 33.1 (*)    All other components within normal limits   URINALYSIS, ROUTINE W REFLEX MICROSCOPIC (NOT AT [facility]) - Abnormal; Notable for the following:    Color, Urine AMBER (*)    Specific Gravity, Urine 1.035 (*)    Ketones, ur 15 (*)    All other components within normal limits  COMPREHENSIVE METABOLIC PANEL    Imaging Review Dg Abd 1 View  02/08/2015   CLINICAL DATA:  Nausea with constipation for 1 week. History of hernia surgery and cholecystectomy. Initial encounter.  EXAM: ABDOMEN - 1 VIEW  COMPARISON:  CT 10/28/2014.  FINDINGS: A large amount of stool is again noted throughout the colon. There is no bowel wall thickening or small bowel distension. There is no supine evidence of free intraperitoneal air. Cholecystectomy clips, IVC filter, pelvic phleboliths and left total hip arthroplasty noted. There are mild right hip degenerative changes.  IMPRESSION: Prominent stool throughout the colon consistent with constipation. No evidence of bowel obstruction.   Electronically Signed   By: Richardean Sale M.D.   On: 02/08/2015 16:53     EKG Interpretation None      MDM   Final diagnoses:  Constipation, unspecified constipation type   Patient presents for constipation and abdominal distention for the past week. She is slightly anemic but her labs are not concerning. Her vitals are normal. KUB shows stool throughout the colon consistent with constipation but no evidence of bowel obstruction. Medications  ondansetron (ZOFRAN-ODT) disintegrating tablet 4 mg (4 mg Oral Not Given 02/08/15 2224)  sodium phosphate (FLEET) 7-19 GM/118ML enema 1 enema (1 enema Rectal Given 02/08/15 1718)  sorbitol, milk of mag, mineral oil, glycerin (SMOG) enema (960 mLs Rectal Given 02/08/15 1958)  She was able to have 2 bowel movements in the ED.  I prescribed milk of magnesia for constipation.  Patient verbally agrees with the plan.     Ottie Glazier, PA-C 02/09/15 0112  Orpah Greek, MD 02/09/15 [phone]

## 2015-02-08 NOTE — ED Notes (Signed)
PA at bedside.

## 2015-02-08 NOTE — ED Notes (Signed)
Pt not in room.  Gown on stretcher.  Pt did not receive discharge instructions before leaving

## 2015-02-08 NOTE — ED Notes (Signed)
Pt reports abd pain and bloating for the past couple of days. 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 States she has tried an enema at home without help.

## 2015-02-09 ENCOUNTER — Encounter: Payer: Self-pay | Admitting: Vascular Surgery

## 2015-02-09 ENCOUNTER — Encounter: Payer: Self-pay | Admitting: Cardiovascular Disease

## 2015-02-13 ENCOUNTER — Telehealth: Payer: Self-pay | Admitting: Family Medicine

## 2015-02-13 ENCOUNTER — Ambulatory Visit (INDEPENDENT_AMBULATORY_CARE_PROVIDER_SITE_OTHER): Payer: Medicare Other | Admitting: Vascular Surgery

## 2015-02-13 ENCOUNTER — Encounter (HOSPITAL_COMMUNITY): Payer: Self-pay

## 2015-02-13 ENCOUNTER — Telehealth: Payer: Self-pay

## 2015-02-13 ENCOUNTER — Encounter (INDEPENDENT_AMBULATORY_CARE_PROVIDER_SITE_OTHER): Payer: Self-pay

## 2015-02-13 ENCOUNTER — Other Ambulatory Visit: Payer: Self-pay | Admitting: Family Medicine

## 2015-02-13 ENCOUNTER — Encounter: Payer: Self-pay | Admitting: Vascular Surgery

## 2015-02-13 ENCOUNTER — Emergency Department (HOSPITAL_COMMUNITY)
Admission: EM | Admit: 2015-02-13 | Discharge: 2015-02-13 | Disposition: A | Discharge status: Home / Self Care | Payer: Medicare Other | Attending: Emergency Medicine | Admitting: Emergency Medicine

## 2015-02-13 VITALS — BP 130/85 | HR 87 | Resp 16 | Ht 66.0 in | Wt 209.0 lb

## 2015-02-13 DIAGNOSIS — Z8701 Personal history of pneumonia (recurrent): Secondary | ICD-10-CM | POA: Diagnosis not present

## 2015-02-13 DIAGNOSIS — I10 Essential (primary) hypertension: Secondary | ICD-10-CM | POA: Diagnosis not present

## 2015-02-13 DIAGNOSIS — K034 Hypercementosis: Secondary | ICD-10-CM

## 2015-02-13 DIAGNOSIS — E119 Type 2 diabetes mellitus without complications: Secondary | ICD-10-CM | POA: Diagnosis not present

## 2015-02-13 DIAGNOSIS — I201 Angina pectoris with documented spasm: Secondary | ICD-10-CM

## 2015-02-13 DIAGNOSIS — Z86711 Personal history of pulmonary embolism: Secondary | ICD-10-CM | POA: Insufficient documentation

## 2015-02-13 DIAGNOSIS — G8929 Other chronic pain: Secondary | ICD-10-CM | POA: Insufficient documentation

## 2015-02-13 DIAGNOSIS — K219 Gastro-esophageal reflux disease without esophagitis: Secondary | ICD-10-CM | POA: Insufficient documentation

## 2015-02-13 DIAGNOSIS — K047 Periapical abscess without sinus: Secondary | ICD-10-CM

## 2015-02-13 DIAGNOSIS — K029 Dental caries, unspecified: Secondary | ICD-10-CM | POA: Diagnosis not present

## 2015-02-13 DIAGNOSIS — K002 Abnormalities of size and form of teeth: Secondary | ICD-10-CM | POA: Insufficient documentation

## 2015-02-13 DIAGNOSIS — Z87891 Personal history of nicotine dependence: Secondary | ICD-10-CM | POA: Insufficient documentation

## 2015-02-13 DIAGNOSIS — Z9889 Other specified postprocedural states: Secondary | ICD-10-CM

## 2015-02-13 DIAGNOSIS — Z862 Personal history of diseases of the blood and blood-forming organs and certain disorders involving the immune mechanism: Secondary | ICD-10-CM | POA: Insufficient documentation

## 2015-02-13 DIAGNOSIS — Z8739 Personal history of other diseases of the musculoskeletal system and connective tissue: Secondary | ICD-10-CM | POA: Insufficient documentation

## 2015-02-13 DIAGNOSIS — F329 Major depressive disorder, single episode, unspecified: Secondary | ICD-10-CM | POA: Insufficient documentation

## 2015-02-13 DIAGNOSIS — I252 Old myocardial infarction: Secondary | ICD-10-CM | POA: Diagnosis not present

## 2015-02-13 DIAGNOSIS — K088 Other specified disorders of teeth and supporting structures: Secondary | ICD-10-CM | POA: Insufficient documentation

## 2015-02-13 DIAGNOSIS — Z79899 Other long term (current) drug therapy: Secondary | ICD-10-CM | POA: Insufficient documentation

## 2015-02-13 DIAGNOSIS — Z95828 Presence of other vascular implants and grafts: Secondary | ICD-10-CM

## 2015-02-13 HISTORY — DX: Hypercementosis: K03.4

## 2015-02-13 MED ORDER — DOXYCYCLINE MONOHYDRATE 100 MG PO TABS: 100.0000 mg | ORAL_TABLET | Freq: Two times a day (BID) | ORAL | Status: DC

## 2015-02-13 MED ORDER — BUPIVACAINE HCL (PF) 0.5 % IJ SOLN
1.8000 mL | Freq: Once | INTRAMUSCULAR | Status: AC
  Administered 2015-02-13: 1.8 mL

## 2015-02-13 MED ORDER — OXYCODONE-ACETAMINOPHEN 10-325 MG PO TABS: 1.0000 | ORAL_TABLET | Freq: Four times a day (QID) | ORAL | Status: DC | PRN

## 2015-02-13 NOTE — Telephone Encounter (Signed)
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 I have reviewed her previous lab work and images and the suspicion for this disease is low but I would like to do a few more tests to just rule this out. Bone specific Alk Phos has been ordered.

## 2015-02-13 NOTE — Discharge Instructions (Signed)
Continue to take your antibiotics and pain medication as prescribed by your primary care provider. See below for further instructions for dental pain.  Dental Caries Dental caries is tooth decay. This decay can cause a hole in teeth (cavity) that can get bigger and deeper over time. HOME CARE  Brush and floss your teeth. Do this at least two times a day.  Use a fluoride toothpaste.  Use a mouth rinse if told by your dentist or doctor.  Eat less sugary and starchy foods. Drink less sugary drinks.  Avoid snacking often on sugary and starchy foods. Avoid sipping often on sugary drinks.  Keep regular checkups and cleanings with your dentist.  Use fluoride supplements if told by your dentist or doctor.  Allow fluoride to be applied to teeth if told by your dentist or doctor. Document Released: 05/27/2008 Document Revised: 01/02/2014 Document Reviewed: 08/20/2012 [facility] Patient Information 2015 [place], [place]. This information is not intended to replace advice given to you by your health care provider. Make sure you discuss any questions you have with your health care provider.  Dental Care and Dentist Visits Dental care supports good overall health. Regular dental visits can also help you avoid dental pain, bleeding, infection, and other more serious health problems in the future. It is important to keep the mouth healthy because diseases in the teeth, gums, and other oral tissues can spread to other areas of the body. Some problems, such as diabetes, heart disease, and pre-term labor have been associated with poor oral health.  See your dentist every 6 months. If you experience emergency problems such as a toothache or broken tooth, go to the dentist right away. If you see your dentist regularly, you may catch problems early. It is easier to be treated for problems in the early stages.  WHAT TO EXPECT AT A DENTIST VISIT  Your dentist will look for many common oral health problems and  recommend proper treatment. At your regular dental visit, you can expect:  Gentle cleaning of the teeth and gums. This includes scraping and polishing. This helps to remove the sticky substance around the teeth and gums (plaque). Plaque forms in the mouth shortly after eating. Over time, plaque hardens on the teeth as tartar. If tartar is not removed regularly, it can cause problems. Cleaning also helps remove stains.  Periodic X-rays. These pictures of the teeth and supporting bone will help your dentist assess the health of your teeth.  Periodic fluoride treatments. Fluoride is a natural mineral shown to help strengthen teeth. Fluoride treatmentinvolves applying a fluoride gel or varnish to the teeth. It is most commonly done in children.  Examination of the mouth, tongue, jaws, teeth, and gums to look for any oral health problems, such as:  Cavities (dental caries). This is decay on the tooth caused by plaque, sugar, and acid in the mouth. It is best to catch a cavity when it is small.  Inflammation of the gums caused by plaque buildup (gingivitis).  Problems with the mouth or malformed or misaligned teeth.  Oral cancer or other diseases of the soft tissues or jaws. KEEP YOUR TEETH AND GUMS HEALTHY For healthy teeth and gums, follow these general guidelines as well as your dentist's specific advice:  Have your teeth professionally cleaned at the dentist every 6 months.  Brush twice daily with a fluoride toothpaste.  Floss your teeth daily.  Ask your dentist if you need fluoride supplements, treatments, or fluoride toothpaste.  Eat a healthy diet. Reduce  foods and drinks with added sugar.  Avoid smoking. TREATMENT FOR ORAL HEALTH PROBLEMS If you have oral health problems, treatment varies depending on the conditions present in your teeth and gums.  Your caregiver will most likely recommend good oral hygiene at each visit.  For cavities, gingivitis, or other oral health  disease, your caregiver will perform a procedure to treat the problem. This is typically done at a separate appointment. Sometimes your caregiver will refer you to another dental specialist for specific tooth problems or for surgery. SEEK IMMEDIATE DENTAL CARE IF:  You have pain, bleeding, or soreness in the gum, tooth, jaw, or mouth area.  A permanent tooth becomes loose or separated from the gum socket.  You experience a blow or injury to the mouth or jaw area. Document Released: 04/30/2011 Document Revised: 11/10/2011 Document Reviewed: 04/30/2011 [facility] Patient Information 2015 [place], [place]. This information is not intended to replace advice given to you by your health care provider. Make sure you discuss any questions you have with your health care provider.  DENTAL RESOURCE GUIDE  Patients with Medicaid: [place], [place] [address] [phone]  If unable to pay, or uninsured, contact [facility] [phone]) or [place] [phone] in [place], [place] in [facility] [address]) to become qualified for the adult dental clinic  Other [place] [place], [place], [place], [address]    [phone], Ext. [phone]    2nd and 4th Thursday of the month at 6:30am    10 clients each day by appointment, can sometimes see walk-in patients if someone does not show for an appointment [place], [place], [place], [address]    [phone] [facility]- [address], [place], [place], [address]    [phone]  [place] Department- [phone] [facility] Department- [phone]

## 2015-02-13 NOTE — Telephone Encounter (Signed)
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 I told her that she would  have to see a dentist and then they would refer her to an orthodonotist, but that I consult with Dr. Nadine Counts and will call her back.

## 2015-02-13 NOTE — Telephone Encounter (Signed)
PT HUSBAND IS ASKING IF YOU COULD CALL HER IN A ANTIBOTIC FOR HER JAW IS SWOLLEN AND SHE IS IN A LOT OF PAIN. I THINK THERE IS A MESSAGE FROM THIS MORNING ASKING FOR ADVISE. SAID TO REMIND YOU OF THE DISEASE SHE HAS OF THE JAW. SAYS THAT SHE HAS TOLD YOU ABOUT THIS IN A PAST APPT.

## 2015-02-13 NOTE — Addendum Note (Signed)
Addended by: [name] on: 02/13/2015 10:52 AM   Modules accepted: Orders

## 2015-02-13 NOTE — Telephone Encounter (Signed)
Patient gave me clarity of what the condition (Hypercementosis) was and wants to know if we can give her a reason why she has it since she was told it is a symptom to something else. Patient also asked if she could get a refill of her pain medication to carry her over until she can get in with a dentist. I consult with Dr. Nadine Counts and she said that she will print out a rx for her and have it waiting up front for her to pick up.

## 2015-02-13 NOTE — Telephone Encounter (Signed)
Antibiotic Rx printed out along with pain medication RX which patient's husband has stopped by to pick up.

## 2015-02-13 NOTE — ED Notes (Signed)
Pt reports she was at PCP office today regarding broke right upper molar tooth 2 weeks ago.  Pt was prescribed Percocet and Doxycycline.  Pt hasn't filled meds yet.  Pt took Percocet she had at home with no pain relief.  Pt reports she has been having intermittant chest pain for past several days.  No chest pain a present.  Pt has requested she only be evaluated for dental pain.

## 2015-02-13 NOTE — ED Provider Notes (Signed)
CSN: [id number]     Arrival date & time 02/13/15  2200 History   First MD Initiated Contact with Patient 02/13/15 2233     Chief Complaint  Patient presents with  . Dental Pain     (Consider location/radiation/quality/duration/timing/severity/associated sxs/prior Treatment) HPI Pt is a 59yo female presenting to ED with c/o Right sided dental pain that has gradually worsened over last 2 weeks. Pt was seen by PCP office today, was prescribed percocet and doxycycline.  Per triage note, pt has not filled meds yet but took percocet she had a home w/o relief.  Pt does state she started taking her antibiotics as soon as she had them filled earlier today.  Pain is constant, throbbing, aching, 10/10, worse with eating and drinking. Worse with even soft palpation of Right side of face.  Denies fever, chills, n/v/d. States she knows she needs to see an Chief Financial Officer and not a dentist as she has had teeth removed previously but states that was a "long ordeal" so she has been putting it off.   Past Medical History  Diagnosis Date  . Spinal stenosis   . Hypertension   . Pulmonary embolism 04/2001; 09/19/2014    "after gallbladder OR; "  . High cholesterol   . Myocardial infarction 10/2010 X 3    "while hospitalized"  . Pneumonia 04/2014  . Sleep apnea     "mild" (09/19/2014)  . Type II diabetes mellitus     "dx'd in 2007; lost weight; no RX for ~ 4 yr now" (09/19/2014)  . Anemia   . History of blood transfusion 1985    related to hysterectomy  . Esophageal spasm   . Gastritis   . Gastroenteritis   . IBS (irritable bowel syndrome)   . GERD (gastroesophageal reflux disease)   . Headache     "couple /month lately" (09/19/2014)  . Migraine     "a few times/yr" (09/19/2014)  . Osteoarthritis     "qwhere; mostly around my joints" (09/19/2014)  . Spondylosis   . Chronic back pain     "upper and lower" (09/19/2014)  . Depression     "major" (09/19/2014)  . PTSD (post-traumatic stress disorder)   .  Schizoaffective disorder   . Snoring     a. sleep study 5/16: No OSA   Past Surgical History  Procedure Laterality Date  . Excision/release bursa hip Right 12/1984  . Total hip arthroplasty Left 04-06-2014  . Tonsillectomy and adenoidectomy  ~ 1968  . Appendectomy  1985  . Abdominal hysterectomy  1985  . Cholecystectomy  2002  . Hernia repair  1985  . Umbilical hernia repair  1985  . Vena cava filter placement  03/2014  . Back surgery    . Anterior cervical decomp/discectomy fusion  10/2012  . Breast cyst excision Right   . Cardiac catheterization   2000's; 2009  . Left heart catheterization with coronary angiogram N/A 12/09/2014    Procedure: LEFT HEART CATHETERIZATION WITH CORONARY ANGIOGRAM;  Surgeon: Burnell Blanks, MD;  Location[facility] CATH LAB;  Service: Cardiovascular;  Laterality: N/A;   Family History  Problem Relation Age of Onset  . Heart attack Neg Hx   . Stroke Mother    History  Substance Use Topics  . Smoking status: Former Smoker -- 1.50 packs/day for 10 years    Types: Cigarettes    Quit date: 04/12/1979  . Smokeless tobacco: Never Used  . Alcohol Use: Yes     Comment: 09/19/2014 "nothing in  the last 6 months"   OB History    No data available     Review of Systems  Constitutional: Negative for fever, chills, diaphoresis, appetite change and fatigue.  HENT: Positive for dental problem (Right side dental pain). Negative for sore throat.   Respiratory: Negative for cough and shortness of breath.   Gastrointestinal: Negative for nausea and vomiting.  Neurological: Negative for dizziness, light-headedness and headaches.  All other systems reviewed and are negative.     Allergies  Abilify; Augmentin; Bactrim; Ceftin; Diclofenac; Indocin; Linaclotide; Lisinopril; Morphine; Morphine and related; Simvastatin; Sulfa antibiotics; Sulfamethoxazole-trimethoprim; Wellbutrin; and Quetiapine fumarate  Home Medications   Prior to Admission medications    Medication Sig Start Date End Date Taking? Authorizing Provider  apixaban (ELIQUIS) 5 MG TABS tablet Take 1 tablet (5 mg total) by mouth 2 (two) times daily. 12/12/14  Yes Rhonda G Barrett, PA-C  carvedilol (COREG) 3.125 MG tablet Take 1 tablet (3.125 mg total) by mouth 2 (two) times daily with a meal. 01/08/15  Yes Scott T Kathlen Mody, PA-C  cyclobenzaprine (FLEXERIL) 10 MG tablet Take 1 tablet (10 mg total) by mouth every 8 (eight) hours. Patient taking differently: Take 10 mg by mouth 3 (three) times daily as needed for muscle spasms.  02/02/15  Yes [name], MD  dicyclomine (BENTYL) 10 MG capsule Take 10 mg by mouth 4 (four) times daily as needed for spasms.    Yes Historical Provider, MD  diltiazem (CARDIZEM CD) 120 MG 24 hr capsule Take 1 capsule (120 mg total) by mouth daily. 12/12/14  Yes Rhonda G Barrett, PA-C  diphenhydrAMINE (BENADRYL) 25 MG tablet Take 25 mg by mouth every 8 (eight) hours as needed for allergies.    Yes Historical Provider, MD  doxycycline (ADOXA) 100 MG tablet Take 1 tablet (100 mg total) by mouth 2 (two) times daily. 02/13/15  Yes [name], MD  escitalopram (LEXAPRO) 10 MG tablet Take 10 mg by mouth daily.   Yes Historical Provider, MD  furosemide (LASIX) 20 MG tablet Take 20 mg by mouth daily as needed for fluid or edema.   Yes Historical Provider, MD  gabapentin (NEURONTIN) 600 MG tablet Take 600 mg by mouth 3 (three) times daily.    Yes Historical Provider, MD  isosorbide mononitrate (IMDUR) 30 MG 24 hr tablet Take 0.5 tablets (15 mg total) by mouth daily. 12/12/14  Yes Rhonda G Barrett, PA-C  loratadine (CLARITIN) 10 MG tablet Take 10 mg by mouth daily.   Yes Historical Provider, MD  Lubiprostone (AMITIZA PO) Take 1 tablet by mouth 2 (two) times daily.    Yes Historical Provider, MD  Multiple Vitamin (MULTIVITAMIN WITH MINERALS) TABS tablet Take 1 tablet by mouth daily.   Yes Historical Provider, MD  ondansetron (ZOFRAN) 4 MG tablet Take 1 tablet (4 mg total) by  mouth every 6 (six) hours as needed for nausea or vomiting. 12/12/14  Yes Rhonda G Barrett, PA-C  oxyCODONE-acetaminophen (PERCOCET) 10-325 MG per tablet Take 1 tablet by mouth every 6 (six) hours as needed for pain. 02/13/15  Yes [name], MD  pantoprazole (PROTONIX) 40 MG tablet Take 40 mg by mouth at bedtime.   Yes Historical Provider, MD  traZODone (DESYREL) 50 MG tablet Take 50 mg by mouth at bedtime.   Yes Historical Provider, MD  magnesium hydroxide (MILK OF MAGNESIA) 400 MG/5ML suspension Take 15 mLs by mouth daily as needed for mild constipation. Patient not taking: Reported on 02/13/2015 02/08/15   Ottie Glazier, PA-C  metFORMIN (GLUCOPHAGE) 500 MG tablet Take 1 tablet (500 mg total) by mouth daily with breakfast. Patient not taking: Reported on 02/13/2015 09/24/14   Hosie Poisson, MD  polyethylene glycol (MIRALAX / GLYCOLAX) packet Take 17 g by mouth daily. Patient not taking: Reported on 02/13/2015 10/28/14   Clayton Bibles, PA-C   BP 104/57 mmHg  Pulse 79  Temp(Src) 98.2 F (36.8 C) (Oral)  Resp 16  Ht 5\' 6"  (1.676 m)  Wt 209 lb (94.802 kg)  BMI 33.75 kg/m2  SpO2 93% Physical Exam  Constitutional: She is oriented to person, place, and time. She appears well-developed and well-nourished.  HENT:  Head: Normocephalic and atraumatic.  Right Ear: Hearing, tympanic membrane, external ear and ear canal normal.  Left Ear: Hearing, tympanic membrane, external ear and ear canal normal.  Nose: Nose normal.  Mouth/Throat: Uvula is midline, oropharynx is clear and moist and mucous membranes are normal. She does not have dentures. No oral lesions. No trismus in the jaw. Abnormal dentition. Dental caries present. No dental abscesses, uvula swelling or lacerations.  Multiple missing teeth on Right side upper and lower jaws, Tenderness to Right upper and Lower jaw. No gingival abscess on exam.  Eyes: EOM are normal.  Neck: Normal range of motion. Neck supple.  Cardiovascular: Normal rate.    Pulmonary/Chest: Effort normal.  Musculoskeletal: Normal range of motion.  Neurological: She is alert and oriented to person, place, and time.  Skin: Skin is warm and dry.  Psychiatric: She has a normal mood and affect. Her behavior is normal.  Nursing note and vitals reviewed.   ED Course  NERVE BLOCK Date/Time: 02/13/2015 11:01 PM Performed by: Noland Fordyce Authorized by: Noland Fordyce Consent: Verbal consent obtained. Risks and benefits: risks, benefits and alternatives were discussed Consent given by: patient Patient understanding: patient states understanding of the procedure being performed Patient consent: the patient's understanding of the procedure matches consent given Procedure consent: procedure consent matches procedure scheduled Relevant documents: relevant documents present and verified Test results: test results available and properly labeled Site marked: the operative site was marked Imaging studies: imaging studies available Required items: required blood products, implants, devices, and special equipment available Patient identity confirmed: verbally with patient and arm band Time out: Immediately prior to procedure a "time out" was called to verify the correct patient, procedure, equipment, support staff and site/side marked as required. Indications: pain relief Body area: face/mouth Nerve: inferior alveolar Laterality: right Patient sedated: no Preparation: Patient was prepped and draped in the usual sterile fashion. Patient position: sitting Needle gauge: 78 G Location technique: anatomical landmarks Local anesthetic: bupivacaine 0.5% with epinephrine Anesthetic total: 1.8 ml Outcome: pain improved Patient tolerance: Patient tolerated the procedure well with no immediate complications     Periodontal Exam  Labs Review Labs Reviewed - No data to display  Imaging Review No results found.   EKG Interpretation None      MDM   Final  diagnoses:  Pain due to dental caries   Pt is a 59yo female presenting to ED with exacerbation of Right sided dental pain. Pt prescribed doxycycline and percocet by PCP earlier today for same. Pt requesting dental block for a few hours of pain relief.  Dental block successfully performed in ED w/o immediate complication.   Strongly encouraged to take antibiotics and percocet as prescribed.  Advised to f/u with a dentist and PCP for referral to oral surgery. Return precautions provided. Pt verbalized understanding and agreement with tx plan.  Noland Fordyce, PA-C 02/14/15 [place], MD 02/15/15 [phone]

## 2015-02-13 NOTE — Progress Notes (Signed)
Subjective:     Patient ID: Valerie Vazquez, female   DOB: [birth year], 59 y.o.   MRN: [id number]  HPI this 59 year old female was evaluated for removal of IVC filter. This patient has a copy K history of multiple pulmonary emboli since 2002. She had an IVC filter placed by Dr. Tandy Gaw in July 2015. She has been intermittently on Anna coagulum over the years. Currently she is on Eliquis. She states she has also had clots in her upper extremities. She's had no documented lower extremity DVT she states. Dr. Lysbeth Galas had a discussion with her regarding removal of the filter. She had some vague symptoms and went to the emergency department and she feels that filter needs to be removed. She has no further specific information regarding this. She has no history of a thrombosed IVC. She's had no change in edema of her lower extremities.  Past Medical History  Diagnosis Date  . Spinal stenosis   . Hypertension   . Pulmonary embolism 04/2001; 09/19/2014    "after gallbladder OR; "  . High cholesterol   . Myocardial infarction 10/2010 X 3    "while hospitalized"  . Pneumonia 04/2014  . Sleep apnea     "mild" (09/19/2014)  . Type II diabetes mellitus     "dx'd in 2007; lost weight; no RX for ~ 4 yr now" (09/19/2014)  . Anemia   . History of blood transfusion 1985    related to hysterectomy  . Esophageal spasm   . Gastritis   . Gastroenteritis   . IBS (irritable bowel syndrome)   . GERD (gastroesophageal reflux disease)   . Headache     "couple /month lately" (09/19/2014)  . Migraine     "a few times/yr" (09/19/2014)  . Osteoarthritis     "qwhere; mostly around my joints" (09/19/2014)  . Spondylosis   . Chronic back pain     "upper and lower" (09/19/2014)  . Depression     "major" (09/19/2014)  . PTSD (post-traumatic stress disorder)   . Schizoaffective disorder   . Snoring     a. sleep study 5/16: No OSA    History  Substance Use Topics  . Smoking status: Former Smoker -- 1.50  packs/day for 10 years    Types: Cigarettes    Quit date: 04/12/1979  . Smokeless tobacco: Never Used  . Alcohol Use: Yes     Comment: 09/19/2014 "nothing in the last 6 months"    Family History  Problem Relation Age of Onset  . Heart attack Neg Hx   . Stroke Mother     Allergies  Allergen Reactions  . Abilify [Aripiprazole] Other (See Comments)    Jerking movement, slow motor skills  . Augmentin [Amoxicillin-Pot Clavulanate] Diarrhea and Nausea And Vomiting  . Bactrim [Sulfamethoxazole-Trimethoprim] Diarrhea  . Ceftin [Cefuroxime Axetil] Diarrhea  . Diclofenac Other (See Comments)     Muscle Pain  . Indocin [Indomethacin] Other (See Comments)    Migraines   . Linaclotide Diarrhea  . Lisinopril Diarrhea and Other (See Comments)    Other reaction(s): Dizziness, Vomiting  . Morphine Other (See Comments)    Chest Tightness  . Morphine And Related Other (See Comments)    Chest tightness   . Penicillins Itching  . Simvastatin Other (See Comments)    Muscle Pain  . Sulfa Antibiotics Diarrhea  . Sulfamethoxazole-Trimethoprim Diarrhea  . Wellbutrin [Bupropion] Other (See Comments)    Jerking movement Slurred speech  . Quetiapine Fumarate Palpitations  Current outpatient prescriptions:  .  apixaban (ELIQUIS) 5 MG TABS tablet, Take 1 tablet (5 mg total) by mouth 2 (two) times daily., Disp: 60 tablet, Rfl: 3 .  carvedilol (COREG) 3.125 MG tablet, Take 1 tablet (3.125 mg total) by mouth 2 (two) times daily with a meal., Disp: 180 tablet, Rfl: 3 .  cyclobenzaprine (FLEXERIL) 10 MG tablet, Take 1 tablet (10 mg total) by mouth every 8 (eight) hours., Disp: 40 tablet, Rfl: 3 .  dicyclomine (BENTYL) 10 MG capsule, Take 10 mg by mouth 4 (four) times daily as needed for spasms. , Disp: , Rfl:  .  diltiazem (CARDIZEM CD) 120 MG 24 hr capsule, Take 1 capsule (120 mg total) by mouth daily., Disp: 30 capsule, Rfl: 3 .  diphenhydrAMINE (BENADRYL) 25 MG tablet, Take 25 mg by mouth every 8  (eight) hours as needed for allergies. , Disp: , Rfl:  .  Escitalopram Oxalate (LEXAPRO PO), Take by mouth daily., Disp: , Rfl:  .  furosemide (LASIX) 20 MG tablet, Take 20 mg by mouth daily as needed for fluid or edema., Disp: , Rfl:  .  gabapentin (NEURONTIN) 600 MG tablet, Take 600 mg by mouth every 8 (eight) hours. , Disp: , Rfl:  .  isosorbide mononitrate (IMDUR) 30 MG 24 hr tablet, Take 0.5 tablets (15 mg total) by mouth daily., Disp: 30 tablet, Rfl: 11 .  loratadine (CLARITIN) 10 MG tablet, Take 10 mg by mouth daily., Disp: , Rfl:  .  Lubiprostone (AMITIZA PO), Take by mouth daily., Disp: , Rfl:  .  Menthol-Camphor (ICY HOT ARTHRITIS PAIN RELIEF) 16-4 % LOTN, Apply 1 application topically daily as needed (for pain)., Disp: , Rfl:  .  Multiple Vitamin (MULTIVITAMIN WITH MINERALS) TABS tablet, Take 1 tablet by mouth daily., Disp: , Rfl:  .  ondansetron (ZOFRAN) 4 MG tablet, Take 1 tablet (4 mg total) by mouth every 6 (six) hours as needed for nausea or vomiting., Disp: 12 tablet, Rfl: 0 .  oxyCODONE-acetaminophen (PERCOCET) 10-325 MG per tablet, Take 1 tablet by mouth every 6 (six) hours as needed for pain., Disp: 120 tablet, Rfl: 0 .  Sennosides (EX-LAX PO), Take 2 tablets by mouth 2 (two) times daily., Disp: , Rfl:  .  traZODone (DESYREL) 100 MG tablet, Take 50 mg by mouth at bedtime as needed for sleep. , Disp: , Rfl:  .  magnesium hydroxide (MILK OF MAGNESIA) 400 MG/5ML suspension, Take 15 mLs by mouth daily as needed for mild constipation. (Patient not taking: Reported on 02/13/2015), Disp: 360 mL, Rfl: 0 .  metFORMIN (GLUCOPHAGE) 500 MG tablet, Take 1 tablet (500 mg total) by mouth daily with breakfast. (Patient not taking: Reported on 02/13/2015), Disp: 30 tablet, Rfl: 1 .  polyethylene glycol (MIRALAX / GLYCOLAX) packet, Take 17 g by mouth daily. (Patient not taking: Reported on 02/13/2015), Disp: 14 each, Rfl: 0  Filed Vitals:   02/13/15 1306  BP: 130/85  Pulse: 87  Resp: 16  Height:  5\' 6"  (1.676 m)  Weight: 209 lb (94.802 kg)    Body mass index is 33.75 kg/(m^2).          Review of Systems patient has history of chest pain, palpitations, orthopnea, leg pain with walking, swelling in legs, wheezing, numbness in arms and legs, dizziness, hypercoagulable states, major depression, skin rashes.     Objective:   Physical Exam BP 130/85 mmHg  Pulse 87  Resp 16  Ht 5\' 6"  (1.676 m)  Wt 209 lb (94.802  kg)  BMI 33.75 kg/m2  Gen. well-developed well-nourished female in no apparent distress alert and oriented 3     Assessment:     I had a 30 minute discussion with the patient and her husband regarding IVC filters and indications for removal and retrievable IVC filters. Since patient has a history of recurrent pulmonary emboli I stated that I would not recommend removing a IVC filter particularly after  as this may not be feasible and could lead to other complications and in fact the   presence of the filter may still be indicated       have recommended the patient get intact with Dr.Schneer and refer her to [facility] in this area if she would like further discussions about removal of this IVC filter. I answered their questions regarding this problem.

## 2015-02-15 ENCOUNTER — Telehealth: Payer: Self-pay | Admitting: Family Medicine

## 2015-02-15 NOTE — Telephone Encounter (Signed)
PT IS ASKING THAT YOU PLEASE CALL HER BACK. SAID THAT SHE WAS TO HAVE A REFERRAL FOR A SURGEON  ON ABOUT HER TEETH.  SHE ALSO SAYING THAT HER IVC FILTER IS A TICKING TIME BOMB AND THAT THE DR Kellie Simmering SAID THAT HE WOULD NOT TOUCH IT FOR IT HAS BEEN IN TO LONG AND SHE IS VERY UPSET WITH HIM AND THE VISIT THAT SHE HAD. SAID THAT HE COULD NOT GIVE HER ANY INFORMATION AS WHAT TO DO.

## 2015-02-16 NOTE — Telephone Encounter (Signed)
Contacted this patient to inform her to contact [facility] for the tooth extract. Patient was also inform that it was not advisable for removing the IVC filter due to her medical history and possible complications. Patient then asked if she could get a copy of his note stating why and if she can have images done to show if the IVC is still properly intact and in placed.   Patient stated she did not want to go back to Dr. Ronalee Belts because he refused to put her on a blood thinner prior to the procedure so she cancelled her appointment and choose not to contact their office anymore.  Patient was advised that I would forward this information to Dr. Nadine Counts and take it from there and she said ok.

## 2015-02-16 NOTE — Telephone Encounter (Signed)
Regarding dental issues: give contact info to dentists [place].  Regarding IVC filter: Dr. Evelena Leyden asessment and plan say?  Since patient has a history of recurrent pulmonary emboli I stated that I would not recommend removing a IVC filter particularly after  as this may not be feasible and could lead to other complications and in fact the  presence of the filter may still be indicated. I have recommended the patient get intact with Dr.Schneer and refer her to [facility] in this area if she would like further discussions about removal of this IVC filter.  If the vascular specialists say it is more risky to attempt to take out IVC filter than to keep it in then there is not much more I can do.

## 2015-02-19 NOTE — Telephone Encounter (Signed)
There are several images on file from her ER and Hospital visits showing that her IVC filter is in place with no signs of complications. Reference CT abdomen/pelvis done 06/22/14 and again 10/27/14.

## 2015-02-19 NOTE — Telephone Encounter (Signed)
Contacted this patient to reassure her that based upon recent images her IVC filter is in place w/o any signs of complications. Patient then stated that she has not eaten much due to lots of RLQ pains and lack of bowel movement. Laxatives has not helped. Patient is not sure if it is due to scar tissue or what is going on. Patient was asked if she wanted to come in for an office visit to discuss this with Dr. Nadine Counts and she said yes. Patient was scheduled for 02/20/15 at 9:45am.

## 2015-02-20 ENCOUNTER — Ambulatory Visit
Admission: RE | Admit: 2015-02-20 | Discharge: 2015-02-20 | Disposition: A | Discharge status: Home / Self Care | Payer: Medicare Other | Source: Ambulatory Visit | Attending: Family Medicine | Admitting: Family Medicine

## 2015-02-20 ENCOUNTER — Ambulatory Visit (INDEPENDENT_AMBULATORY_CARE_PROVIDER_SITE_OTHER): Payer: Medicare Other | Admitting: Family Medicine

## 2015-02-20 ENCOUNTER — Encounter: Payer: Self-pay | Admitting: Family Medicine

## 2015-02-20 VITALS — BP 132/88 | HR 82 | Temp 98.1°F | Resp 16 | Ht 66.0 in | Wt 203.9 lb

## 2015-02-20 DIAGNOSIS — R1084 Generalized abdominal pain: Secondary | ICD-10-CM

## 2015-02-20 DIAGNOSIS — R109 Unspecified abdominal pain: Secondary | ICD-10-CM | POA: Diagnosis present

## 2015-02-20 DIAGNOSIS — K5901 Slow transit constipation: Secondary | ICD-10-CM | POA: Diagnosis not present

## 2015-02-20 DIAGNOSIS — K589 Irritable bowel syndrome without diarrhea: Secondary | ICD-10-CM

## 2015-02-20 DIAGNOSIS — I201 Angina pectoris with documented spasm: Secondary | ICD-10-CM

## 2015-02-20 MED ORDER — MAGNESIUM CITRATE PO SOLN: 1.0000 | Freq: Once | ORAL | Status: DC

## 2015-02-20 NOTE — Progress Notes (Signed)
Name: Valerie Vazquez   MRN: [id number]    DOB: [birth year]   Date:02/20/2015       Progress Note  Subjective  Chief Complaint  Chief Complaint  Patient presents with  . Abdominal Pain    patient presents with chronic constipation, lack of appetite, RLQ pain due to inability to move bowels. Patient question if it is due to scarred tissue.    HPI  Constipation: Patient complains of constipation.  Stool pattern has been 1 firm and pellet like stool(s) per week. More recently liquid small amount. Onset was several years ago. Defecation has been difficult and incomplete. Co-Morbid conditions:culprit medication: opioid analgesics and irritable bowel syndrome. Symptoms have been gradually worsening. This is a long standing problem. Has been to ER several times with imaging of stomach done showing no bowel obstruction or acute findings other can constipation. Prescribed bentyl 52m and amitiza and miralax. IV, oral and other medications given at ER did not help this time. She is having pain and distension right side of abdomen with nausea. Cut back on pain medication. 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      Patient Active Problem List   Diagnosis Date Noted  . 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 Constipation 12/12/2014  . Coronary vasospasm 12/09/2014  . Pulmonary embolism 09/19/2014  . Impingement syndrome of shoulder 08/30/2014  . Injury of superior glenoid labrum of shoulder joint 08/16/2014  . Supraspinatus tenosynovitis 08/16/2014  . Cervical spondylosis without myelopathy 08/16/2014  . Anxiety and depression 04/13/2014  . Osteoarthritis of left hip 04/11/2014  . Status post left hip replacement 04/11/2014  . IBS (irritable bowel syndrome) 04/11/2014  . GERD (gastroesophageal reflux disease) 04/11/2014  . Unspecified constipation  04/11/2014  . Insomnia 04/11/2014  . Essential hypertension, benign 10/28/2013  . Left hip pain 10/28/2013  . Anemia 10/28/2013  . HLD (hyperlipidemia) 10/28/2013    History  Substance Use Topics  . Smoking status: Former Smoker -- 1.50 packs/day for 10 years    Types: Cigarettes    Quit date: 04/12/1979  . Smokeless tobacco: Never Used  . Alcohol Use: Yes     Comment: 09/19/2014 "nothing in the last 6 months"     Current outpatient prescriptions:  .  apixaban (ELIQUIS) 5 MG TABS tablet, Take 1 tablet (5 mg total) by mouth 2 (two) times daily., Disp: 60 tablet, Rfl: 3 .  carvedilol (COREG) 3.125 MG tablet, Take 1 tablet (3.125 mg total) by mouth 2 (two) times daily with a meal., Disp: 180 tablet, Rfl: 3 .  cyclobenzaprine (FLEXERIL) 10 MG tablet, Take 1 tablet (10 mg total) by mouth every 8 (eight) hours. (Patient taking differently: Take 10 mg by mouth 3 (three) times daily as needed for muscle spasms. ), Disp: 40 tablet, Rfl: 3 .  dicyclomine (BENTYL) 10 MG capsule, Take 10 mg by mouth 4 (four) times daily as needed for spasms. , Disp: , Rfl:  .  diltiazem (CARDIZEM CD) 120 MG 24 hr capsule, Take 1 capsule (120 mg total) by mouth daily., Disp: 30 capsule, Rfl: 3 .  diphenhydrAMINE (BENADRYL) 25 MG tablet, Take 25 mg by mouth every 8 (eight) hours as needed for allergies. , Disp: , Rfl:  .  doxycycline (ADOXA) 100 MG tablet, Take 1 tablet (100 mg total) by mouth 2 (two) times daily., Disp: 20 tablet, Rfl: 0 .  escitalopram (  LEXAPRO) 10 MG tablet, Take 10 mg by mouth daily., Disp: , Rfl:  .  furosemide (LASIX) 20 MG tablet, Take 20 mg by mouth daily as needed for fluid or edema., Disp: , Rfl:  .  gabapentin (NEURONTIN) 600 MG tablet, Take 600 mg by mouth 3 (three) times daily. , Disp: , Rfl:  .  isosorbide mononitrate (IMDUR) 30 MG 24 hr tablet, Take 0.5 tablets (15 mg total) by mouth daily., Disp: 30 tablet, Rfl: 11 .  loratadine (CLARITIN) 10 MG tablet, Take 10 mg by mouth daily.,  Disp: , Rfl:  .  Lubiprostone (AMITIZA PO), Take 1 tablet by mouth 2 (two) times daily. , Disp: , Rfl:  .  magnesium hydroxide (MILK OF MAGNESIA) 400 MG/5ML suspension, Take 15 mLs by mouth daily as needed for mild constipation., Disp: 360 mL, Rfl: 0 .  metFORMIN (GLUCOPHAGE) 500 MG tablet, Take 1 tablet (500 mg total) by mouth daily with breakfast., Disp: 30 tablet, Rfl: 1 .  Multiple Vitamin (MULTIVITAMIN WITH MINERALS) TABS tablet, Take 1 tablet by mouth daily., Disp: , Rfl:  .  ondansetron (ZOFRAN) 4 MG tablet, Take 1 tablet (4 mg total) by mouth every 6 (six) hours as needed for nausea or vomiting., Disp: 12 tablet, Rfl: 0 .  oxyCODONE-acetaminophen (PERCOCET) 10-325 MG per tablet, Take 1 tablet by mouth every 6 (six) hours as needed for pain., Disp: 120 tablet, Rfl: 0 .  pantoprazole (PROTONIX) 40 MG tablet, Take 40 mg by mouth at bedtime., Disp: , Rfl:  .  polyethylene glycol (MIRALAX / GLYCOLAX) packet, Take 17 g by mouth daily., Disp: 14 each, Rfl: 0 .  traZODone (DESYREL) 50 MG tablet, Take 50 mg by mouth at bedtime., Disp: , Rfl:   Allergies  Allergen Reactions  . Abilify [Aripiprazole] Other (See Comments)    Jerking movement, slow motor skills  . Augmentin [Amoxicillin-Pot Clavulanate] Diarrhea and Nausea And Vomiting  . Bactrim [Sulfamethoxazole-Trimethoprim] Diarrhea  . Ceftin [Cefuroxime Axetil] Diarrhea  . Diclofenac Other (See Comments)     Muscle Pain  . Indocin [Indomethacin] Other (See Comments)    Migraines   . Linaclotide Diarrhea  . Lisinopril Diarrhea and Other (See Comments)    Other reaction(s): Dizziness, Vomiting  . Morphine Other (See Comments)    Chest Tightness  . Morphine And Related Other (See Comments)    Chest tightness   . Simvastatin Other (See Comments)    Muscle Pain  . Sulfa Antibiotics Diarrhea  . Sulfamethoxazole-Trimethoprim Diarrhea  . Wellbutrin [Bupropion] Other (See Comments)    Jerking movement Slurred speech  . Quetiapine Fumarate  Palpitations    Review of Systems  Ten systems reviewed and is negative except as mentioned in HPI.    Objective  BP 132/88 mmHg  Pulse 82  Temp(Src) 98.1 F (36.7 C) (Oral)  Resp 16  Ht _0  (1.676 m)  Wt 203 lb 14.4 oz (92.488 kg)  BMI 32.93 kg/m2  SpO2 98%  Physical Exam  Constitutional: Patient is obese and well-nourished. In no distress but sitting in wheel chair today.  Neck: Normal range of motion. Neck supple. No JVD present. No thyromegaly present.  Cardiovascular: Normal rate, regular rhythm and normal heart sounds.  No murmur heard.  Pulmonary/Chest: Effort normal and breath sounds normal. No respiratory distress.  Physical Exam  Abdominal: She exhibits distension. She exhibits no shifting dullness, no pulsatile liver, no fluid wave, no abdominal bruit, no ascites and no mass. Bowel sounds are hypoactive. There is no hepatosplenomegaly.  There is generalized tenderness. There is no rigidity, no rebound, no guarding, no tenderness at McBurney's point and negative Murphy's sign.   Musculoskeletal: Normal range of motion bilateral UE and LE, no joint effusions. Peripheral vascular: Bilateral LE no edema. Neurological: CN II-XII grossly intact with no focal deficits. Alert and oriented to person, place, and time. Coordination, balance, strength, speech and gait are normal.  Skin: Skin is warm and dry. No rash noted. No erythema.  Psychiatric: Patient is tearful today. Behavior is normal in office today. Judgment and thought content normal in office today.   Recent Results (from the past 2160 hour(s))  Brain natriuretic peptide     Status: None   Collection Time: 12/09/14  2:45 PM  Result Value Ref Range   B Natriuretic Peptide 90.6 0.0 - 100.0 pg/mL  CBC     Status: Abnormal   Collection Time: 12/09/14  2:45 PM  Result Value Ref Range   WBC 6.3 4.0 - 10.5 K/uL   RBC 3.67 (L) 3.87 - 5.11 MIL/uL   Hemoglobin 10.6 (L) 12.0 - 15.0 g/dL   HCT 32.8 (L) 36.0 - 46.0 %    MCV 89.4 78.0 - 100.0 fL   MCH 28.9 26.0 - 34.0 pg   MCHC 32.3 30.0 - 36.0 g/dL   RDW 13.9 11.5 - 15.5 %   Platelets 378 150 - 400 K/uL  Basic metabolic panel     Status: Abnormal   Collection Time: 12/09/14  2:45 PM  Result Value Ref Range   Sodium 138 135 - 145 mmol/L   Potassium 4.7 3.5 - 5.1 mmol/L   Chloride 103 96 - 112 mmol/L   CO2 29 19 - 32 mmol/L   Glucose, Bld 100 (H) 70 - 99 mg/dL   BUN 8 6 - 23 mg/dL   Creatinine, Ser 0.95 0.50 - 1.10 mg/dL   Calcium 9.3 8.4 - 10.5 mg/dL   GFR calc non Af Amer 64 (L) >90 mL/min   GFR calc Af Amer 74 (L) >90 mL/min    Comment: (NOTE) The eGFR has been calculated using the CKD EPI equation. This calculation has not been validated in all clinical situations. eGFR's persistently <90 mL/min signify possible Chronic Kidney Disease.    Anion gap 6 5 - 15  I-stat troponin, ED (not at [facility]     Status: None   Collection Time: 12/09/14  2:51 PM  Result Value Ref Range   Troponin i, poc 0.00 0.00 - 0.08 ng/mL   Comment 3            Comment: Due to the release kinetics of cTnI, a negative result within the first hours of the onset of symptoms does not rule out myocardial infarction with certainty. If myocardial infarction is still suspected, repeat the test at appropriate intervals.   Hepatic function panel     Status: Abnormal   Collection Time: 12/09/14  3:27 PM  Result Value Ref Range   Total Protein 6.4 6.0 - 8.3 g/dL   Albumin 3.5 3.5 - 5.2 g/dL   AST 29 0 - 37 U/L   ALT 26 0 - 35 U/L   Alkaline Phosphatase 107 39 - 117 U/L   Total Bilirubin 0.3 0.3 - 1.2 mg/dL   Bilirubin, Direct 0.1 0.0 - 0.5 mg/dL   Indirect Bilirubin 0.2 (L) 0.3 - 0.9 mg/dL  D-dimer, quantitative     Status: Abnormal   Collection Time: 12/09/14  6:36 PM  Result Value Ref Range  D-Dimer, Quant 1.07 (H) 0.00 - 0.48 ug/mL-FEU    Comment:        AT THE INHOUSE ESTABLISHED CUTOFF VALUE OF 0.48 ug/mL FEU, THIS ASSAY HAS BEEN DOCUMENTED IN THE LITERATURE  TO HAVE A SENSITIVITY AND NEGATIVE PREDICTIVE VALUE OF AT LEAST 98 TO 99%.  THE TEST RESULT SHOULD BE CORRELATED WITH AN ASSESSMENT OF THE CLINICAL PROBABILITY OF DVT / VTE.   MRSA PCR Screening     Status: None   Collection Time: 12/10/14 12:15 AM  Result Value Ref Range   MRSA by PCR NEGATIVE NEGATIVE    Comment:        The GeneXpert MRSA Assay (FDA approved for NASAL specimens only), is one component of a comprehensive MRSA colonization surveillance program. It is not intended to diagnose MRSA infection nor to guide or monitor treatment for MRSA infections.   CBC     Status: Abnormal   Collection Time: 12/10/14  3:45 AM  Result Value Ref Range   WBC 6.5 4.0 - 10.5 K/uL   RBC 3.47 (L) 3.87 - 5.11 MIL/uL   Hemoglobin 10.0 (L) 12.0 - 15.0 g/dL   HCT 30.7 (L) 36.0 - 46.0 %   MCV 88.5 78.0 - 100.0 fL   MCH 28.8 26.0 - 34.0 pg   MCHC 32.6 30.0 - 36.0 g/dL   RDW 13.8 11.5 - 15.5 %   Platelets 350 150 - 400 K/uL  Basic metabolic panel     Status: Abnormal   Collection Time: 12/10/14  3:45 AM  Result Value Ref Range   Sodium 139 135 - 145 mmol/L   Potassium 3.8 3.5 - 5.1 mmol/L    Comment: DELTA CHECK NOTED   Chloride 103 96 - 112 mmol/L   CO2 23 19 - 32 mmol/L   Glucose, Bld 99 70 - 99 mg/dL   BUN 5 (L) 6 - 23 mg/dL   Creatinine, Ser 0.74 0.50 - 1.10 mg/dL   Calcium 8.6 8.4 - 10.5 mg/dL   GFR calc non Af Amer >90 >90 mL/min   GFR calc Af Amer >90 >90 mL/min    Comment: (NOTE) The eGFR has been calculated using the CKD EPI equation. This calculation has not been validated in all clinical situations. eGFR's persistently <90 mL/min signify possible Chronic Kidney Disease.    Anion gap 13 5 - 15  Troponin I     Status: None   Collection Time: 12/10/14 12:52 PM  Result Value Ref Range   Troponin I <0.03 <0.031 ng/mL    Comment:        NO INDICATION OF MYOCARDIAL INJURY.   CBC     Status: Abnormal   Collection Time: 12/11/14  3:08 AM  Result Value Ref Range    WBC 7.4 4.0 - 10.5 K/uL   RBC 3.41 (L) 3.87 - 5.11 MIL/uL   Hemoglobin 10.0 (L) 12.0 - 15.0 g/dL   HCT 31.0 (L) 36.0 - 46.0 %   MCV 90.9 78.0 - 100.0 fL   MCH 29.3 26.0 - 34.0 pg   MCHC 32.3 30.0 - 36.0 g/dL   RDW 13.9 11.5 - 15.5 %   Platelets 350 150 - 400 K/uL  Basic metabolic panel     Status: Abnormal   Collection Time: 12/11/14  3:08 AM  Result Value Ref Range   Sodium 140 135 - 145 mmol/L   Potassium 4.0 3.5 - 5.1 mmol/L   Chloride 105 96 - 112 mmol/L   CO2 31 19 - 32  mmol/L   Glucose, Bld 115 (H) 70 - 99 mg/dL   BUN 8 6 - 23 mg/dL   Creatinine, Ser 0.79 0.50 - 1.10 mg/dL   Calcium 8.8 8.4 - 10.5 mg/dL   GFR calc non Af Amer 89 (L) >90 mL/min   GFR calc Af Amer >90 >90 mL/min    Comment: (NOTE) The eGFR has been calculated using the CKD EPI equation. This calculation has not been validated in all clinical situations. eGFR's persistently <90 mL/min signify possible Chronic Kidney Disease.    Anion gap 4 (L) 5 - 15  CBC     Status: Abnormal   Collection Time: 12/12/14  4:14 AM  Result Value Ref Range   WBC 7.3 4.0 - 10.5 K/uL   RBC 3.80 (L) 3.87 - 5.11 MIL/uL   Hemoglobin 11.1 (L) 12.0 - 15.0 g/dL   HCT 33.9 (L) 36.0 - 46.0 %   MCV 89.2 78.0 - 100.0 fL   MCH 29.2 26.0 - 34.0 pg   MCHC 32.7 30.0 - 36.0 g/dL   RDW 13.8 11.5 - 15.5 %   Platelets 353 150 - 400 K/uL  Basic metabolic panel     Status: Abnormal   Collection Time: 12/12/14  4:14 AM  Result Value Ref Range   Sodium 139 135 - 145 mmol/L   Potassium 4.0 3.5 - 5.1 mmol/L   Chloride 104 96 - 112 mmol/L   CO2 27 19 - 32 mmol/L   Glucose, Bld 102 (H) 70 - 99 mg/dL   BUN 10 6 - 23 mg/dL   Creatinine, Ser 0.72 0.50 - 1.10 mg/dL   Calcium 9.2 8.4 - 10.5 mg/dL   GFR calc non Af Amer >90 >90 mL/min   GFR calc Af Amer >90 >90 mL/min    Comment: (NOTE) The eGFR has been calculated using the CKD EPI equation. This calculation has not been validated in all clinical situations. eGFR's persistently <90 mL/min  signify possible Chronic Kidney Disease.    Anion gap 8 5 - 15  Basic Metabolic Panel (BMET)     Status: Abnormal   Collection Time: 01/08/15 11:46 AM  Result Value Ref Range   Sodium 137 135 - 145 mEq/L   Potassium 4.9 3.5 - 5.1 mEq/L   Chloride 102 96 - 112 mEq/L   CO2 34 (H) 19 - 32 mEq/L   Glucose, Bld 111 (H) 70 - 99 mg/dL   BUN 11 6 - 23 mg/dL   Creatinine, Ser 0.88 0.40 - 1.20 mg/dL   Calcium 9.8 8.4 - 10.5 mg/dL   GFR 84.08 >60.00 mL/min  B Nat Peptide     Status: None   Collection Time: 01/08/15 11:46 AM  Result Value Ref Range   Pro B Natriuretic peptide (BNP) 49.0 0.0 - 100.0 pg/mL  CBC WITH DIFFERENTIAL     Status: Abnormal   Collection Time: 02/08/15  3:43 PM  Result Value Ref Range   WBC 5.9 4.0 - 10.5 K/uL   RBC 3.76 (L) 3.87 - 5.11 MIL/uL   Hemoglobin 10.7 (L) 12.0 - 15.0 g/dL   HCT 33.1 (L) 36.0 - 46.0 %   MCV 88.0 78.0 - 100.0 fL   MCH 28.5 26.0 - 34.0 pg   MCHC 32.3 30.0 - 36.0 g/dL   RDW 15.2 11.5 - 15.5 %   Platelets 330 150 - 400 K/uL   Neutrophils Relative % 44 43 - 77 %   Neutro Abs 2.6 1.7 - 7.7 K/uL   Lymphocytes  Relative 44 12 - 46 %   Lymphs Abs 2.6 0.7 - 4.0 K/uL   Monocytes Relative 6 3 - 12 %   Monocytes Absolute 0.3 0.1 - 1.0 K/uL   Eosinophils Relative 5 0 - 5 %   Eosinophils Absolute 0.3 0.0 - 0.7 K/uL   Basophils Relative 1 0 - 1 %   Basophils Absolute 0.0 0.0 - 0.1 K/uL  Comprehensive metabolic panel     Status: None   Collection Time: 02/08/15  3:43 PM  Result Value Ref Range   Sodium 139 135 - 145 mmol/L   Potassium 4.2 3.5 - 5.1 mmol/L   Chloride 103 101 - 111 mmol/L   CO2 27 22 - 32 mmol/L   Glucose, Bld 99 65 - 99 mg/dL   BUN 9 6 - 20 mg/dL   Creatinine, Ser 0.89 0.44 - 1.00 mg/dL   Calcium 9.3 8.9 - 10.3 mg/dL   Total Protein 7.1 6.5 - 8.1 g/dL   Albumin 3.8 3.5 - 5.0 g/dL   AST 26 15 - 41 U/L   ALT 21 14 - 54 U/L   Alkaline Phosphatase 98 38 - 126 U/L   Total Bilirubin 0.4 0.3 - 1.2 mg/dL   GFR calc non Af Amer >60  >60 mL/min   GFR calc Af Amer >60 >60 mL/min    Comment: (NOTE) The eGFR has been calculated using the CKD EPI equation. This calculation has not been validated in all clinical situations. eGFR's persistently <60 mL/min signify possible Chronic Kidney Disease.    Anion gap 9 5 - 15  Urinalysis, Routine w reflex microscopic (not at [facility])     Status: Abnormal   Collection Time: 02/08/15  4:21 PM  Result Value Ref Range   Color, Urine AMBER (A) YELLOW    Comment: BIOCHEMICALS MAY BE AFFECTED BY COLOR   APPearance CLEAR CLEAR   Specific Gravity, Urine 1.035 (H) 1.005 - 1.030   pH 5.0 5.0 - 8.0   Glucose, UA NEGATIVE NEGATIVE mg/dL   Hgb urine dipstick NEGATIVE NEGATIVE   Bilirubin Urine NEGATIVE NEGATIVE   Ketones, ur 15 (A) NEGATIVE mg/dL   Protein, ur NEGATIVE NEGATIVE mg/dL   Urobilinogen, UA 1.0 0.0 - 1.0 mg/dL   Nitrite NEGATIVE NEGATIVE   Leukocytes, UA NEGATIVE NEGATIVE    Comment: MICROSCOPIC NOT DONE ON URINES WITH NEGATIVE PROTEIN, BLOOD, LEUKOCYTES, NITRITE, OR GLUCOSE <1000 mg/dL.     Assessment & Plan  1. Generalized abdominal pain Long standing waxing and waning problem that is now not responsive to usual measures including Amitiza, Miralax, Bentyl, OTC Laxatives, Enemas, ER visits with oral and IV therapy. CT AbdomenPelvis done 10/28/14 and prior to that 06/22/14 with no significant findings other than constipation. Abdominal Xray done 02/08/15 shows large amount of stool in colon without acute findings suggesting bowel obstruction.  I have recommended that Mrs Dimas Millin consult with GI specialist.  I will get abdominal Xray today and start her on Mag Citrate.   - DG Abd 2 Views; Future  2. IBS (irritable bowel syndrome) Long standing problem.   3. Slow transit constipation Long standing problem.   - DG Abd 2 Views; Future - magnesium citrate (EQ MAGNESIUM CITRATE) SOLN; Take 296 mLs (1 Bottle total) by mouth once.  Dispense: 296 mL; Refill: 1

## 2015-02-20 NOTE — Patient Instructions (Signed)

## 2015-02-22 ENCOUNTER — Encounter: Payer: Self-pay | Admitting: Pharmacist

## 2015-02-25 ENCOUNTER — Other Ambulatory Visit: Payer: Self-pay | Admitting: Family Medicine

## 2015-02-25 DIAGNOSIS — F329 Major depressive disorder, single episode, unspecified: Secondary | ICD-10-CM

## 2015-02-25 DIAGNOSIS — Z86718 Personal history of other venous thrombosis and embolism: Secondary | ICD-10-CM | POA: Insufficient documentation

## 2015-02-25 DIAGNOSIS — F419 Anxiety disorder, unspecified: Secondary | ICD-10-CM

## 2015-02-25 DIAGNOSIS — F32A Depression, unspecified: Secondary | ICD-10-CM

## 2015-02-25 MED ORDER — ESCITALOPRAM OXALATE 10 MG PO TABS: 10.0000 mg | ORAL_TABLET | Freq: Every day | ORAL | Status: DC

## 2015-02-26 ENCOUNTER — Other Ambulatory Visit: Payer: Self-pay | Admitting: Family Medicine

## 2015-02-26 ENCOUNTER — Telehealth: Payer: Self-pay | Admitting: Family Medicine

## 2015-02-26 DIAGNOSIS — K0510 Chronic gingivitis, plaque induced: Secondary | ICD-10-CM | POA: Insufficient documentation

## 2015-02-26 DIAGNOSIS — K047 Periapical abscess without sinus: Secondary | ICD-10-CM

## 2015-02-26 MED ORDER — DOXYCYCLINE MONOHYDRATE 100 MG PO TABS: 100.0000 mg | ORAL_TABLET | Freq: Two times a day (BID) | ORAL | Status: DC

## 2015-02-26 NOTE — Telephone Encounter (Signed)
Patient needs refill on Doxycycline antibiotic. She states she needs this to be sent to [facility]. She would like a call back as well because she has questions regarding her condition.

## 2015-02-26 NOTE — Telephone Encounter (Signed)
Please let Valerie Vazquez know that the Doxycycline has been refilled however this will be the last refill for some months as chronic use of antibiotics is not recommended. I hope she has made efforts to seek Dental Care as recommended previously. And a referral to Gastroenterology has been placed regarding her chronic constipation issues. I am not sure how else I may be of service.

## 2015-02-27 ENCOUNTER — Telehealth: Payer: Self-pay | Admitting: Family Medicine

## 2015-02-27 ENCOUNTER — Other Ambulatory Visit: Payer: Self-pay

## 2015-02-27 DIAGNOSIS — G8929 Other chronic pain: Secondary | ICD-10-CM

## 2015-02-27 DIAGNOSIS — M2550 Pain in unspecified joint: Principal | ICD-10-CM

## 2015-02-27 NOTE — Telephone Encounter (Signed)
Pt is requesting refill on Cyclobenzaprine to be sent to [facility].

## 2015-02-27 NOTE — Telephone Encounter (Signed)
Valerie Vazquez at 02/27/2015 4:13 PM     Status: Signed       Expand All Collapse All   Pt is requesting refill on Cyclobenzaprine to be sent to [facility].

## 2015-02-28 MED ORDER — CYCLOBENZAPRINE HCL 10 MG PO TABS: 10.0000 mg | ORAL_TABLET | Freq: Three times a day (TID) | ORAL | Status: DC

## 2015-02-28 NOTE — Telephone Encounter (Signed)
Approved by Dr. Nadine Counts and electronically sent to pharmacy

## 2015-03-03 ENCOUNTER — Emergency Department (HOSPITAL_COMMUNITY)
Admission: EM | Admit: 2015-03-03 | Discharge: 2015-03-03 | Disposition: A | Discharge status: Home / Self Care | Payer: Medicare Other | Attending: Emergency Medicine | Admitting: Emergency Medicine

## 2015-03-03 ENCOUNTER — Encounter (HOSPITAL_COMMUNITY): Payer: Self-pay | Admitting: Nurse Practitioner

## 2015-03-03 ENCOUNTER — Emergency Department (HOSPITAL_COMMUNITY): Payer: Medicare Other

## 2015-03-03 DIAGNOSIS — R072 Precordial pain: Secondary | ICD-10-CM

## 2015-03-03 DIAGNOSIS — Z9049 Acquired absence of other specified parts of digestive tract: Secondary | ICD-10-CM | POA: Diagnosis not present

## 2015-03-03 DIAGNOSIS — K219 Gastro-esophageal reflux disease without esophagitis: Secondary | ICD-10-CM | POA: Diagnosis not present

## 2015-03-03 DIAGNOSIS — F329 Major depressive disorder, single episode, unspecified: Secondary | ICD-10-CM | POA: Diagnosis not present

## 2015-03-03 DIAGNOSIS — R61 Generalized hyperhidrosis: Secondary | ICD-10-CM | POA: Diagnosis not present

## 2015-03-03 DIAGNOSIS — I10 Essential (primary) hypertension: Secondary | ICD-10-CM | POA: Insufficient documentation

## 2015-03-03 DIAGNOSIS — R05 Cough: Secondary | ICD-10-CM | POA: Diagnosis not present

## 2015-03-03 DIAGNOSIS — Z86711 Personal history of pulmonary embolism: Secondary | ICD-10-CM | POA: Diagnosis not present

## 2015-03-03 DIAGNOSIS — Z9889 Other specified postprocedural states: Secondary | ICD-10-CM | POA: Diagnosis not present

## 2015-03-03 DIAGNOSIS — I201 Angina pectoris with documented spasm: Secondary | ICD-10-CM

## 2015-03-03 DIAGNOSIS — Z79899 Other long term (current) drug therapy: Secondary | ICD-10-CM | POA: Diagnosis not present

## 2015-03-03 DIAGNOSIS — R110 Nausea: Secondary | ICD-10-CM | POA: Insufficient documentation

## 2015-03-03 DIAGNOSIS — Z8701 Personal history of pneumonia (recurrent): Secondary | ICD-10-CM | POA: Diagnosis not present

## 2015-03-03 DIAGNOSIS — R0789 Other chest pain: Secondary | ICD-10-CM | POA: Diagnosis not present

## 2015-03-03 DIAGNOSIS — Z862 Personal history of diseases of the blood and blood-forming organs and certain disorders involving the immune mechanism: Secondary | ICD-10-CM | POA: Insufficient documentation

## 2015-03-03 DIAGNOSIS — I252 Old myocardial infarction: Secondary | ICD-10-CM | POA: Insufficient documentation

## 2015-03-03 DIAGNOSIS — E119 Type 2 diabetes mellitus without complications: Secondary | ICD-10-CM | POA: Insufficient documentation

## 2015-03-03 DIAGNOSIS — G8929 Other chronic pain: Secondary | ICD-10-CM | POA: Insufficient documentation

## 2015-03-03 DIAGNOSIS — Z90710 Acquired absence of both cervix and uterus: Secondary | ICD-10-CM | POA: Insufficient documentation

## 2015-03-03 DIAGNOSIS — R079 Chest pain, unspecified: Secondary | ICD-10-CM

## 2015-03-03 DIAGNOSIS — Z87891 Personal history of nicotine dependence: Secondary | ICD-10-CM | POA: Diagnosis not present

## 2015-03-03 LAB — CBC
HCT: 33.8 % — ABNORMAL LOW (ref 36.0–46.0)
Hemoglobin: 11.1 g/dL — ABNORMAL LOW (ref 12.0–15.0)
MCH: 28.5 pg (ref 26.0–34.0)
MCHC: 32.8 g/dL (ref 30.0–36.0)
MCV: 86.9 fL (ref 78.0–100.0)
Platelets: 332 10*3/uL (ref 150–400)
RBC: 3.89 MIL/uL (ref 3.87–5.11)
RDW: 15.3 % (ref 11.5–15.5)
WBC: 4.9 10*3/uL (ref 4.0–10.5)

## 2015-03-03 LAB — BASIC METABOLIC PANEL
Anion gap: 7 (ref 5–15)
BUN: 9 mg/dL (ref 6–20)
CO2: 26 mmol/L (ref 22–32)
Calcium: 9.1 mg/dL (ref 8.9–10.3)
Chloride: 107 mmol/L (ref 101–111)
Creatinine, Ser: 0.74 mg/dL (ref 0.44–1.00)
GFR calc Af Amer: >60 mL/min (ref >60)
GFR calc non Af Amer: >60 mL/min (ref >60)
Glucose, Bld: 107 mg/dL — ABNORMAL HIGH (ref 65–99)
Potassium: 4.1 mmol/L (ref 3.5–5.1)
Sodium: 140 mmol/L (ref 135–145)

## 2015-03-03 LAB — I-STAT TROPONIN, ED
Troponin i, poc: 0.01 ng/mL (ref 0.00–0.08)
Troponin i, poc: 0 ng/mL (ref 0.00–0.08)

## 2015-03-03 MED ORDER — ISOSORBIDE MONONITRATE ER 30 MG PO TB24
30.0000 mg | ORAL_TABLET | Freq: Every day | ORAL | Status: DC
Start: 2015-03-03 — End: 2015-03-22

## 2015-03-03 MED ORDER — NITROGLYCERIN 0.4 MG SL SUBL
0.4000 mg | SUBLINGUAL_TABLET | SUBLINGUAL | Status: DC | PRN
  Administered 2015-03-03 (×2): 0.4 mg via SUBLINGUAL
  Filled 2015-03-03: qty 1

## 2015-03-03 NOTE — Consult Note (Signed)
Primary cardiologist: Dr. Darlina Guys Consulting cardiologist: Dr. Satira Sark  Reason for consultation: Chest pain  Clinical Summary Valerie Vazquez is a 59 y.o.female with past medical history outlined below, now presenting to the ER with recurrent chest discomfort. 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 She has been treated with diltiazem CD and Imdur, was seen in the office by Mr. Kathlen Mody in May, and has pending follow-up with Dr. Angelena Form. She states that over the last few days she has been experiencing sharp discomfort in her chest, some diaphoresis. States that she has been "very busy" with activities. She reports compliance with her medications.  In the ER she has had improvement in symptoms with sublingual nitroglycerin. Initial troponin I is negative at 0.01. ECG shows nonspecific inferolateral ST-T wave abnormalities that are similar to previous tracing from May, no ST elevation to suggest active spasm. Chest x-ray shows no pulmonary edema or infiltrates. Heart size normal.   Allergies  Allergen Reactions  . Abilify [Aripiprazole] Other (See Comments)    Jerking movement, slow motor skills  . Augmentin [Amoxicillin-Pot Clavulanate] Diarrhea and Nausea And Vomiting  . Bactrim [Sulfamethoxazole-Trimethoprim] Diarrhea  . Ceftin [Cefuroxime Axetil] Diarrhea  . Diclofenac Other (See Comments)     Muscle Pain  . Indocin [Indomethacin] Other (See Comments)    Migraines   . Linaclotide Diarrhea  . Lisinopril Diarrhea and Other (See Comments)    Other reaction(s): Dizziness, Vomiting  . Morphine Other (See Comments)    Chest Tightness  . Morphine And Related Other (See Comments)    Chest tightness   . Simvastatin Other (See Comments)    Muscle Pain  . Sulfa Antibiotics  Diarrhea  . Sulfamethoxazole-Trimethoprim Diarrhea  . Wellbutrin [Bupropion] Other (See Comments)    Jerking movement Slurred speech  . Quetiapine Fumarate Palpitations    Medications No current facility-administered medications on file prior to encounter.   Current Outpatient Prescriptions on File Prior to Encounter  Medication Sig Dispense Refill  . apixaban (ELIQUIS) 5 MG TABS tablet Take 1 tablet (5 mg total) by mouth 2 (two) times daily. 60 tablet 3  . carvedilol (COREG) 3.125 MG tablet Take 1 tablet (3.125 mg total) by mouth 2 (two) times daily with a meal. 180 tablet 3  . cyclobenzaprine (FLEXERIL) 10 MG tablet Take 1 tablet (10 mg total) by mouth every 8 (eight) hours. 40 tablet 1  . dicyclomine (BENTYL) 10 MG capsule Take 10 mg by mouth 4 (four) times daily as needed for spasms.     Marland Kitchen diltiazem (CARDIZEM CD) 120 MG 24 hr capsule Take 1 capsule (120 mg total) by mouth daily. 30 capsule 3  . diphenhydrAMINE (BENADRYL) 25 MG tablet Take 25 mg by mouth every 8 (eight) hours as needed for allergies.     Marland Kitchen doxycycline (ADOXA) 100 MG tablet Take 1 tablet (100 mg total) by mouth 2 (two) times daily. 20 tablet 0  . escitalopram (LEXAPRO) 10 MG tablet Take 1 tablet (10 mg total) by mouth daily. 30 tablet 5  . furosemide (LASIX) 20 MG tablet Take 20 mg by mouth daily as needed for fluid or edema.    . gabapentin (NEURONTIN) 600 MG tablet Take 600 mg by mouth 3 (three) times daily.     . isosorbide mononitrate (IMDUR) 30 MG 24 hr tablet  Take 0.5 tablets (15 mg total) by mouth daily. 30 tablet 11  . loratadine (CLARITIN) 10 MG tablet Take 10 mg by mouth daily.    . Lubiprostone (AMITIZA PO) Take 1 tablet by mouth 2 (two) times daily.     . metFORMIN (GLUCOPHAGE) 500 MG tablet Take 1 tablet (500 mg total) by mouth daily with breakfast. 30 tablet 1  . Multiple Vitamin (MULTIVITAMIN WITH MINERALS) TABS tablet Take 1 tablet by mouth daily.    . ondansetron (ZOFRAN) 4 MG tablet Take 1 tablet (4 mg  total) by mouth every 6 (six) hours as needed for nausea or vomiting. 12 tablet 0  . oxyCODONE-acetaminophen (PERCOCET) 10-325 MG per tablet Take 1 tablet by mouth every 6 (six) hours as needed for pain. 120 tablet 0  . pantoprazole (PROTONIX) 40 MG tablet Take 40 mg by mouth at bedtime.    . polyethylene glycol (MIRALAX / GLYCOLAX) packet Take 17 g by mouth daily. 14 each 0  . traZODone (DESYREL) 50 MG tablet Take 50 mg by mouth at bedtime.      Past Medical History  Diagnosis Date  . Spinal stenosis   . Hypertension   . Pulmonary embolism 04/2001; 09/19/2014    "after gallbladder OR; "  . High cholesterol   . Myocardial infarction 10/2010 X 3    "while hospitalized"  . Pneumonia 04/2014  . Sleep apnea     "mild" (09/19/2014)  . Type II diabetes mellitus     "dx'd in 2007; lost weight; no RX for ~ 4 yr now" (09/19/2014)  . Anemia   . History of blood transfusion 1985    related to hysterectomy  . Esophageal spasm   . Gastritis   . Gastroenteritis   . IBS (irritable bowel syndrome)   . GERD (gastroesophageal reflux disease)   . Headache     "couple /month lately" (09/19/2014)  . Migraine     "a few times/yr" (09/19/2014)  . Osteoarthritis     "qwhere; mostly around my joints" (09/19/2014)  . Spondylosis   . Chronic back pain     "upper and lower" (09/19/2014)  . Depression     "major" (09/19/2014)  . PTSD (post-traumatic stress disorder)   . Schizoaffective disorder   . Snoring     a. sleep study 5/16: No OSA    Past Surgical History  Procedure Laterality Date  . Excision/release bursa hip Right 12/1984  . Total hip arthroplasty Left 04-06-2014  . Tonsillectomy and adenoidectomy  ~ 1968  . Appendectomy  1985  . Abdominal hysterectomy  1985  . Cholecystectomy  2002  . Hernia repair  1985  . Umbilical hernia repair  1985  . Vena cava filter placement  03/2014  . Back surgery    . Anterior cervical decomp/discectomy fusion  10/2012  . Breast cyst excision Right   . Cardiac  catheterization   2000's; 2009  . Left heart catheterization with coronary angiogram N/A 12/09/2014    Procedure: LEFT HEART CATHETERIZATION WITH CORONARY ANGIOGRAM;  Surgeon: Burnell Blanks, MD;  Location: [facility] CATH LAB;  Service: Cardiovascular;  Laterality: N/A;    Family History  Problem Relation Age of Onset  . Heart attack Neg Hx   . Stroke Mother     Social History Valerie Vazquez reports that she quit smoking about 35 years ago. Her smoking use included Cigarettes. She has a 15 pack-year smoking history. She has never used smokeless tobacco. Valerie Vazquez reports that she drinks alcohol.  Review of Systems Complete review of systems negative except as otherwise outlined in the clinical summary and also the following. Reports, "tooth problem" states that she has been taking antibiotic and anticipates dental work. Some jaw pain associated with this.  Physical Examination Blood pressure 128/72, pulse 64, temperature 98.3 F (36.8 C), temperature source Oral, resp. rate 14, SpO2 98 %. No intake or output data in the 24 hours ending 03/03/15 1422  Telemetry: Sinus rhythm.  Gen.: Appears comfortable at rest. HEENT: Conjunctiva and lids normal, oropharynx clear. Neck: Supple, no elevated JVP or carotid bruits, no thyromegaly. Lungs: Clear to auscultation, nonlabored breathing at rest. Cardiac: Regular rate and rhythm, no S3 or significant systolic murmur, no pericardial rub. Abdomen: Soft, nontender, bowel sounds present. Extremities: No pitting edema, distal pulses 2+. Skin: Warm and dry. Musculoskeletal: No kyphosis. Neuropsychiatric: Alert and oriented x3, affect grossly appropriate.   Lab Results  Basic Metabolic Panel:  Recent Labs Lab 03/03/15 1145  NA 140  K 4.1  CL 107  CO2 26  GLUCOSE 107*  BUN 9  CREATININE 0.74  CALCIUM 9.1    CBC:  Recent Labs Lab 03/03/15 1145  WBC 4.9  HGB 11.1*  HCT 33.8*  MCV 86.9  PLT 332    Cardiac Enzymes: Troponin I  0.01   Impression  1. Recurrent chest pain with suspected history of coronary vasospasm. At this time ECG shows no ST segment elevation to suggest active coronary vasospasm, initial troponin I level was negative. She has had improvement in symptoms with sublingual nitroglycerin. Outpatient regimen includes Cardizem CD and Imdur. She reports compliance with these medications.  2. No angiographic obstructive CAD by cardiac catheterization in April of this year.  3. 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 Not likely to be recurrent PE as patient is not tachycardic or hypoxic, is anticoagulated, and has an IVC filter in place.  4. Esophageal spasm also listed in past medical history. This could also be a cause of her symptoms.   Recommendations  Discussed situation with patient and also Dr. Alvino Chapel. Would recommend checking a repeat set of cardiac enzymes. If these are negative and she remains clinically stable, most likely can be discharged home on higher dose Imdur, would increase to 30 mg daily. Otherwise continue Cardizem CD. If she continues to have symptoms, might consider switching from diltiazem to Procardia as this has also been shown to be effective with coronary vasospasm. We will schedule an office follow-up visit in the coming week.  Satira Sark, M.D., F.A.C.C.

## 2015-03-03 NOTE — ED Provider Notes (Signed)
CSN: [id number]     Arrival date & time 03/03/15  1113 History   First MD Initiated Contact with Patient 03/03/15 1131     Chief Complaint  Patient presents with  . Chest Pain     (Consider location/radiation/quality/duration/timing/severity/associated sxs/prior Treatment) Patient is a 59 y.o. female presenting with chest pain. The history is provided by the patient.  Chest Pain Pain location:  Substernal area Associated symptoms: diaphoresis and nausea   Associated symptoms: no abdominal pain, no back pain, no headache, no numbness, no shortness of breath, not vomiting and no weakness    patient presents with chest pain. Pain in the mid chest pressure that goes up toward neck. She been having it on and off for the last few days. States it feels like when she had her previous coronary spasm. No fevers but has had some chills and sweating. She's had a rare cough. No swelling or legs. No nausea vomiting. No sick contacts. States she does have a history of allergies. Previous heart cath around 3 months ago that showed no coronary artery disease had presumed vasospasm since she had had some dynamic EKG changes.  Past Medical History  Diagnosis Date  . Spinal stenosis   . Hypertension   . Pulmonary embolism 04/2001; 09/19/2014    "after gallbladder OR; "  . High cholesterol   . Myocardial infarction 10/2010 X 3    "while hospitalized"  . Pneumonia 04/2014  . Sleep apnea     "mild" (09/19/2014)  . Type II diabetes mellitus     "dx'd in 2007; lost weight; no RX for ~ 4 yr now" (09/19/2014)  . Anemia   . History of blood transfusion 1985    related to hysterectomy  . Esophageal spasm   . Gastritis   . Gastroenteritis   . IBS (irritable bowel syndrome)   . GERD (gastroesophageal reflux disease)   . Headache     "couple /month lately" (09/19/2014)  . Migraine     "a few times/yr" (09/19/2014)  . Osteoarthritis     "qwhere; mostly around my joints" (09/19/2014)  . Spondylosis   . Chronic  back pain     "upper and lower" (09/19/2014)  . Depression     "major" (09/19/2014)  . PTSD (post-traumatic stress disorder)   . Schizoaffective disorder   . Snoring     a. sleep study 5/16: No OSA   Past Surgical History  Procedure Laterality Date  . Excision/release bursa hip Right 12/1984  . Total hip arthroplasty Left 04-06-2014  . Tonsillectomy and adenoidectomy  ~ 1968  . Appendectomy  1985  . Abdominal hysterectomy  1985  . Cholecystectomy  2002  . Hernia repair  1985  . Umbilical hernia repair  1985  . Vena cava filter placement  03/2014  . Back surgery    . Anterior cervical decomp/discectomy fusion  10/2012  . Breast cyst excision Right   . Cardiac catheterization   2000's; 2009  . Left heart catheterization with coronary angiogram N/A 12/09/2014    Procedure: LEFT HEART CATHETERIZATION WITH CORONARY ANGIOGRAM;  Surgeon: Burnell Blanks, MD;  Location: [facility] CATH LAB;  Service: Cardiovascular;  Laterality: N/A;   Family History  Problem Relation Age of Onset  . Heart attack Neg Hx   . Stroke Mother    History  Substance Use Topics  . Smoking status: Former Smoker -- 1.50 packs/day for 10 years    Types: Cigarettes    Quit date: 04/12/1979  . Smokeless  tobacco: Never Used  . Alcohol Use: Yes     Comment: 09/19/2014 "nothing in the last 6 months"   OB History    No data available     Review of Systems  Constitutional: Positive for diaphoresis. Negative for activity change and appetite change.  Eyes: Negative for pain.  Respiratory: Negative for chest tightness and shortness of breath.   Cardiovascular: Positive for chest pain. Negative for leg swelling.  Gastrointestinal: Positive for nausea. Negative for vomiting, abdominal pain and diarrhea.  Genitourinary: Negative for flank pain.  Musculoskeletal: Negative for back pain and neck stiffness.  Skin: Negative for rash.  Neurological: Negative for weakness, numbness and headaches.  Psychiatric/Behavioral:  Negative for behavioral problems.      Allergies  Abilify; Augmentin; Bactrim; Ceftin; Diclofenac; Indocin; Linaclotide; Lisinopril; Morphine; Morphine and related; Simvastatin; Sulfa antibiotics; Sulfamethoxazole-trimethoprim; Wellbutrin; and Quetiapine fumarate  Home Medications   Prior to Admission medications   Medication Sig Start Date End Date Taking? Authorizing Provider  apixaban (ELIQUIS) 5 MG TABS tablet Take 1 tablet (5 mg total) by mouth 2 (two) times daily. 12/12/14  Yes Rhonda G Barrett, PA-C  carvedilol (COREG) 3.125 MG tablet Take 1 tablet (3.125 mg total) by mouth 2 (two) times daily with a meal. 01/08/15  Yes Scott T Kathlen Mody, PA-C  cyclobenzaprine (FLEXERIL) 10 MG tablet Take 1 tablet (10 mg total) by mouth every 8 (eight) hours. 02/28/15  Yes [name], MD  dicyclomine (BENTYL) 10 MG capsule Take 10 mg by mouth 4 (four) times daily as needed for spasms.    Yes Historical Provider, MD  diltiazem (CARDIZEM CD) 120 MG 24 hr capsule Take 1 capsule (120 mg total) by mouth daily. 12/12/14  Yes Rhonda G Barrett, PA-C  diphenhydrAMINE (BENADRYL) 25 MG tablet Take 25 mg by mouth every 8 (eight) hours as needed for allergies.    Yes Historical Provider, MD  doxycycline (ADOXA) 100 MG tablet Take 1 tablet (100 mg total) by mouth 2 (two) times daily. 02/26/15  Yes [name], MD  escitalopram (LEXAPRO) 10 MG tablet Take 1 tablet (10 mg total) by mouth daily. 02/25/15  Yes [name], MD  furosemide (LASIX) 20 MG tablet Take 20 mg by mouth daily as needed for fluid or edema.   Yes Historical Provider, MD  gabapentin (NEURONTIN) 600 MG tablet Take 600 mg by mouth 3 (three) times daily.    Yes Historical Provider, MD  isosorbide mononitrate (IMDUR) 30 MG 24 hr tablet Take 0.5 tablets (15 mg total) by mouth daily. 12/12/14  Yes Rhonda G Barrett, PA-C  loratadine (CLARITIN) 10 MG tablet Take 10 mg by mouth daily.   Yes Historical Provider, MD  Lubiprostone (AMITIZA PO) Take 1 tablet  by mouth 2 (two) times daily.    Yes Historical Provider, MD  metFORMIN (GLUCOPHAGE) 500 MG tablet Take 1 tablet (500 mg total) by mouth daily with breakfast. 09/24/14  Yes Hosie Poisson, MD  Multiple Vitamin (MULTIVITAMIN WITH MINERALS) TABS tablet Take 1 tablet by mouth daily.   Yes Historical Provider, MD  ondansetron (ZOFRAN) 4 MG tablet Take 1 tablet (4 mg total) by mouth every 6 (six) hours as needed for nausea or vomiting. 12/12/14  Yes Rhonda G Barrett, PA-C  oxyCODONE-acetaminophen (PERCOCET) 10-325 MG per tablet Take 1 tablet by mouth every 6 (six) hours as needed for pain. 02/13/15  Yes [name], MD  pantoprazole (PROTONIX) 40 MG tablet Take 40 mg by mouth at bedtime.   Yes Historical Provider, MD  polyethylene  glycol (MIRALAX / GLYCOLAX) packet Take 17 g by mouth daily. 10/28/14  Yes Clayton Bibles, PA-C  traZODone (DESYREL) 50 MG tablet Take 50 mg by mouth at bedtime.   Yes Historical Provider, MD   BP 127/69 mmHg  Pulse 60  Temp(Src) 98.3 F (36.8 C) (Oral)  Resp 16  SpO2 95% Physical Exam  Constitutional: She is oriented to person, place, and time. She appears well-developed and well-nourished.  HENT:  Head: Normocephalic and atraumatic.  Eyes: Pupils are equal, round, and reactive to light.  Neck: Normal range of motion. Neck supple.  Cardiovascular: Normal rate, regular rhythm and normal heart sounds.   No murmur heard. Pulmonary/Chest: Effort normal and breath sounds normal. No respiratory distress. She has no wheezes. She has no rales.  Abdominal: Soft. Bowel sounds are normal. She exhibits no distension. There is no tenderness. There is no rebound and no guarding.  Musculoskeletal: Normal range of motion.  Neurological: She is alert and oriented to person, place, and time. No cranial nerve deficit.  Skin: Skin is warm and dry.  Psychiatric: She has a normal mood and affect. Her speech is normal.  Nursing note and vitals reviewed.   ED Course  Procedures (including  critical care time) Labs Review Labs Reviewed  BASIC METABOLIC PANEL - Abnormal; Notable for the following:    Glucose, Bld 107 (*)    All other components within normal limits  CBC - Abnormal; Notable for the following:    Hemoglobin 11.1 (*)    HCT 33.8 (*)    All other components within normal limits  I-STAT TROPOININ, ED  Randolm Idol, ED    Imaging Review Dg Chest 2 View  03/03/2015   CLINICAL DATA:  Mid sternal chest pain radiating to the neck. Cough for 3 days. Previous smoker.  EXAM: CHEST  2 VIEW  COMPARISON:  12/09/2014  FINDINGS: Surgical plate and screws in the lower cervical spine. Both lungs are clear. Heart and mediastinum are within normal limits. Negative for pleural effusions. Patient has an IVC filter. No acute bone abnormality.  IMPRESSION: No active cardiopulmonary disease.  IVC filter.   Electronically Signed   By: Markus Daft M.D.   On: 03/03/2015 13:06     EKG Interpretation   Date/Time:  Saturday March 03 2015 11:19:14 EDT Ventricular Rate:  80 PR Interval:  146 QRS Duration: 80 QT Interval:  370 QTC Calculation: 426 R Axis:   33 Text Interpretation:  Normal sinus rhythm T wave abnormality, consider  inferolateral ischemia Abnormal ECG Confirmed by Alvino Chapel  MD, Lorma Heater  [id number]) on 03/03/2015 11:43:38 AM      MDM   Final diagnoses:  Chest pain, unspecified chest pain type    Patient with chest pain. Similar symptoms in the past with possible coronary vasospasm. Showed no ischemic disease. Discussed with Dr. Domenic Polite cardiology and will increase her Imdur the patient will follow-up with her cardiologist as needed.    Davonna Belling, MD 03/03/15 1525

## 2015-03-03 NOTE — ED Notes (Signed)
Cardiology at bedside.

## 2015-03-03 NOTE — ED Notes (Signed)
Pt reports midsternal CP radiating into neck intermittent x 3 days. Nothing increases the pain. She tried acid reducers, pain meds with no relief of the pain. She denies SOB, nausea. The pain reminds her of when she had coronary spasms. A&Ox4, resp e/u

## 2015-03-03 NOTE — Discharge Instructions (Signed)

## 2015-03-07 ENCOUNTER — Other Ambulatory Visit: Payer: Self-pay | Admitting: Family Medicine

## 2015-03-07 DIAGNOSIS — F418 Other specified anxiety disorders: Secondary | ICD-10-CM

## 2015-03-16 ENCOUNTER — Telehealth: Payer: Self-pay | Admitting: Cardiovascular Disease

## 2015-03-16 MED ORDER — NIFEDIPINE ER OSMOTIC RELEASE 30 MG PO TB24: 30.0000 mg | ORAL_TABLET | Freq: Every day | ORAL | Status: DC

## 2015-03-16 NOTE — Telephone Encounter (Signed)
I spoke with the patient.  She reports that she was in the ER on 03/03/15 for symptoms of jaw pain that radiated to her face, chin, and chest. She was admitted in April 2016 for chest pain with ST elevation- code STEMI was called- cath showed normal coronaries. Symptoms at that time related to coronary vasospasm.  Dr. Domenic Polite consulted on her in the ER on 7/2- EKG showed non-specific ST-T wave abnormalities that were similar to tracings from May.  He recommended increasing imdur from 15 mg daily to 30 mg daily, which she has done. He also recommended that if symptoms persist, which could be related to vasospasm, that diltiazem be discontinued and procardia be started.  Troponins were negative in the ER.  She was to follow up within 1 week of discharge. She is just calling today. She is still having ongoing problems with jaw/ face/ chest discomfort.  She states that it never goes away, but will become "mild" and then she will have a surge of discomfort.  This is not related to activity.  I reviewed with Dr. Radford Pax (DOD)- ok to discontinue cardizem and start Procardia XL 30 mg once daily. The patient is currently taking Protonix 40 mg once daily. She will need follow up with the APP in the next 1-2 weeks. I have reviewed med changes with the patient and she is agreeable and verbalizes understanding.  I have scheduled her to follow up with Rosaria Ferries, PA- C on 03/20/15 at 1:30 pm.  The patient is aware of this appointment and has been advised to arrive at 1:15 pm.

## 2015-03-16 NOTE — Telephone Encounter (Signed)
New message      Pt c/o of Chest Pain: STAT if CP now or developed within 24 hours  1. Are you having CP right now? yes 2. Are you experiencing any other symptoms (ex. SOB, nausea, vomiting, sweating)?  Nausea, jaw pain and shoulder pain  3. How long have you been experiencing CP?  Woke up this am with this pain 4. Is your CP continuous or coming and going? continuous  5. Have you taken Nitroglycerin?  no?

## 2015-03-16 NOTE — Telephone Encounter (Signed)
Follow Up       Pt calling wanting to know if the pain doesn't subside if she should take more Isosorbide. Pt also states she has a cough and can't seem to clear her throat. Please call backa nd advise.

## 2015-03-20 ENCOUNTER — Encounter: Payer: Self-pay | Admitting: Physician Assistant

## 2015-03-20 ENCOUNTER — Ambulatory Visit (INDEPENDENT_AMBULATORY_CARE_PROVIDER_SITE_OTHER): Payer: Medicare Other | Admitting: Physician Assistant

## 2015-03-20 VITALS — BP 100/74 | HR 72 | Ht 66.0 in | Wt 207.8 lb

## 2015-03-20 DIAGNOSIS — R079 Chest pain, unspecified: Secondary | ICD-10-CM | POA: Diagnosis not present

## 2015-03-20 DIAGNOSIS — I201 Angina pectoris with documented spasm: Secondary | ICD-10-CM | POA: Diagnosis not present

## 2015-03-20 MED ORDER — NITROGLYCERIN 0.4 MG SL SUBL: 0.4000 mg | SUBLINGUAL_TABLET | SUBLINGUAL | Status: DC | PRN

## 2015-03-20 NOTE — Progress Notes (Signed)
Cardiology Office Note   Date:  03/20/2015   ID:  Valerie Vazquez, DOB 15-Sep-1955, MRN [id number]  PCP:  [name], MD  Cardiologist:  Dr Asencion Islam, PA-C   No chief complaint on file.   History of Present Illness: Valerie Vazquez is a 59 y.o. 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  She went to the ER 07/02 for chest pain, her ECG was abnormal. She was seen by Dr Domenic Polite and her dose of Imdur was increased.   [name] presents for evaluation after her ER visit. Her chest pain improved significantly once she came up with a regimen to treat her constipation. The regimen involves taking ExLax and water with a pinch of salt/pepper in it. Since she started doing this, her constipation and her chest pain have improved.  She has done well since then, only a couple of episodes of chest pain. She has increased her activity around the house without difficulty. The episodes of chest pain have not been associated with exertion, caffeine use or other problem.  Past Medical History  Diagnosis Date  . Spinal stenosis   . Hypertension   . Pulmonary embolism 04/2001; 09/19/2014    "after gallbladder OR; "  . High cholesterol   . Myocardial infarction 10/2010 X 3    "while hospitalized"  . Pneumonia 04/2014  . Sleep apnea     "mild" (09/19/2014)  . Type II diabetes mellitus     "dx'd in 2007; lost weight; no RX for ~ 4 yr now" (09/19/2014)  . Anemia   . History of blood transfusion 1985    related to hysterectomy  . Esophageal spasm   . Gastritis   . Gastroenteritis   . IBS (irritable bowel syndrome)   . GERD (gastroesophageal reflux disease)   . Headache     "couple /month lately" (09/19/2014)  . Migraine     "a few times/yr" (09/19/2014)  .  Osteoarthritis     "qwhere; mostly around my joints" (09/19/2014)  . Spondylosis   . Chronic back pain     "upper and lower" (09/19/2014)  . Depression     "major" (09/19/2014)  . PTSD (post-traumatic stress disorder)   . Schizoaffective disorder   . Snoring     a. sleep study 5/16: No OSA    Past Surgical History  Procedure Laterality Date  . Excision/release bursa hip Right 12/1984  . Total hip arthroplasty Left 04-06-2014  . Tonsillectomy and adenoidectomy  ~ 1968  . Appendectomy  1985  . Abdominal hysterectomy  1985  . Cholecystectomy  2002  . Hernia repair  1985  . Umbilical hernia repair  1985  . Vena cava filter placement  03/2014  . Back surgery    . Anterior cervical decomp/discectomy fusion  10/2012  . Breast cyst excision Right   . Cardiac catheterization   2000's; 2009  . Left heart catheterization with coronary angiogram N/A 12/09/2014    Procedure: LEFT HEART CATHETERIZATION WITH CORONARY ANGIOGRAM;  Surgeon: Burnell Blanks, MD;  Location: [facility] CATH LAB;  Service: Cardiovascular;  Laterality: N/A;    Current Outpatient Prescriptions  Medication Sig Dispense Refill  . apixaban (ELIQUIS) 5 MG TABS tablet Take 1 tablet (5 mg total) by mouth 2 (two) times daily. 60 tablet 3  .  carvedilol (COREG) 3.125 MG tablet Take 1 tablet (3.125 mg total) by mouth 2 (two) times daily with a meal. 180 tablet 3  . cyclobenzaprine (FLEXERIL) 10 MG tablet Take 1 tablet (10 mg total) by mouth every 8 (eight) hours. 40 tablet 1  . dicyclomine (BENTYL) 10 MG capsule Take 10 mg by mouth 4 (four) times daily as needed for spasms.     Marland Kitchen diltiazem (TIAZAC) 120 MG 24 hr capsule Take 120 mg by mouth daily.    . diphenhydrAMINE (BENADRYL) 25 MG tablet Take 25 mg by mouth every 8 (eight) hours as needed for allergies.     Marland Kitchen escitalopram (LEXAPRO) 10 MG tablet TAKE 1 TABLET BY MOUTH EVERY DAY 30 tablet 5  . furosemide (LASIX) 20 MG tablet Take 20 mg by mouth daily as needed for fluid or edema.      . gabapentin (NEURONTIN) 600 MG tablet Take 600 mg by mouth 3 (three) times daily.     . isosorbide mononitrate (IMDUR) 30 MG 24 hr tablet Take 1 tablet (30 mg total) by mouth daily. 30 tablet 11  . Lubiprostone (AMITIZA PO) Take 1 tablet by mouth 2 (two) times daily.     . Multiple Vitamin (MULTIVITAMIN WITH MINERALS) TABS tablet Take 1 tablet by mouth daily.    . ondansetron (ZOFRAN) 4 MG tablet Take 1 tablet (4 mg total) by mouth every 6 (six) hours as needed for nausea or vomiting. 12 tablet 0  . oxyCODONE-acetaminophen (PERCOCET) 10-325 MG per tablet Take 1 tablet by mouth every 6 (six) hours as needed for pain. 120 tablet 0  . pantoprazole (PROTONIX) 40 MG tablet Take 40 mg by mouth at bedtime.    . traZODone (DESYREL) 50 MG tablet Take 50 mg by mouth at bedtime.    . nitroGLYCERIN (NITROSTAT) 0.4 MG SL tablet Place 1 tablet (0.4 mg total) under the tongue every 5 (five) minutes as needed for chest pain. Take 1 NTG, under your tongue, while sitting. If no relief of pain may repeat NTG, one tab every 5 minutes up to 3 tablets total over 15 minutes. 25 tablet 3   No current facility-administered medications for this visit.    Allergies:   Abilify; Augmentin; Bactrim; Ceftin; Diclofenac; Indocin; Linaclotide; Lisinopril; Morphine; Morphine and related; Simvastatin; Sulfa antibiotics; Sulfamethoxazole-trimethoprim; Wellbutrin; and Quetiapine fumarate    Social History:  The patient  reports that she quit smoking about 35 years ago. Her smoking use included Cigarettes. She has a 15 pack-year smoking history. She has never used smokeless tobacco. She reports that she drinks alcohol. She reports that she does not use illicit drugs.   Family History:  The patient's family history includes Stroke in her mother. There is no history of Heart attack.    ROS:  Please see the history of present illness. All other systems are reviewed and negative.    PHYSICAL EXAM: VS:  BP 100/74 mmHg  Pulse 72   Ht 5\' 6"  (1.676 m)  Wt 207 lb 12.8 oz (94.257 kg)  BMI 33.56 kg/m2 , BMI Body mass index is 33.56 kg/(m^2). GEN: Well nourished, well developed, in no acute distress HEENT: normal Neck: no JVD, carotid bruits, or masses Cardiac: RRR; no murmurs, rubs, or gallops,no edema  Respiratory:  clear to auscultation bilaterally, normal work of breathing GI: soft, nontender, nondistended, + BS MS: no deformity or atrophy Skin: warm and dry, no rash Neuro:  Strength and sensation are intact Psych: euthymic mood, full affect   EKG:  EKG is ordered today. The ekg ordered today demonstrates SR, no new ischemic changes.   Recent Labs: 09/19/2014: TSH 2.596 12/09/2014: B Natriuretic Peptide 90.6 01/08/2015: Pro B Natriuretic peptide (BNP) 49.0 02/08/2015: ALT 21 03/03/2015: BUN 9; Creatinine, Ser 0.74; Hemoglobin 11.1*; Platelets 332; Potassium 4.1; Sodium 140    Lipid Panel No results found for: CHOL, TRIG, HDL, CHOLHDL, VLDL, LDLCALC, LDLDIRECT   Wt Readings from Last 3 Encounters:  03/20/15 207 lb 12.8 oz (94.257 kg)  02/20/15 203 lb 14.4 oz (92.488 kg)  02/13/15 209 lb (94.802 kg)     Other studies Reviewed: Additional studies/ records that were reviewed today include: Previous office visits and ER visits, ECGs, cath report.  ASSESSMENT AND PLAN:  1.  Coronary vasospasm, chest pain: continue current therapy. No change in daily medications. Discussed that the Imdur could be increased as needed if symptoms change. Nitro SL was added to her medications today. Her BP is borderline so she was instructed not to drive and to lie down if she needs to take it.   Current medicines are reviewed at length with the patient today.  The patient does not have concerns regarding medicines.  The following changes have been made:  Add SL NTG  Labs/ tests ordered today include:   Orders Placed This Encounter  Procedures  . EKG 12-Lead     Disposition:   FU with Dr Angelena Form  Signed, Lenoard Aden  03/20/2015 5:54 PM    [address], [place], [place]  [address] Phone: [phone]; Fax: [phone]

## 2015-03-20 NOTE — Patient Instructions (Addendum)
Medication Instructions:  Your physician has recommended you make the following change in your medication:   1- Nitroglycerin 0.4 mg under the tongue as needed for chest pain ( Take 1 NTG, under your tongue, while sitting. If no relief of chest pain may repeat NTG, one tab every 5 minutes up to 3 tablets total over 15 minutes. If no relief CALL 911. If you have dizziness/lightheadness while taking NTG, stop taking and call 911.)  Labwork: NONE  Testing/Procedures: NONE  Follow-Up: Your physician recommends that you schedule a follow-up appointment with Dr. Angelena Form as planned.

## 2015-03-22 ENCOUNTER — Encounter (HOSPITAL_COMMUNITY): Payer: Self-pay

## 2015-03-22 ENCOUNTER — Other Ambulatory Visit: Payer: Self-pay

## 2015-03-22 ENCOUNTER — Telehealth: Payer: Self-pay | Admitting: Cardiovascular Disease

## 2015-03-22 ENCOUNTER — Emergency Department (HOSPITAL_COMMUNITY)
Admission: EM | Admit: 2015-03-22 | Discharge: 2015-03-22 | Disposition: A | Discharge status: Home / Self Care | Payer: Medicare Other | Attending: Emergency Medicine | Admitting: Emergency Medicine

## 2015-03-22 ENCOUNTER — Emergency Department (HOSPITAL_COMMUNITY): Payer: Medicare Other

## 2015-03-22 DIAGNOSIS — Z86711 Personal history of pulmonary embolism: Secondary | ICD-10-CM | POA: Diagnosis not present

## 2015-03-22 DIAGNOSIS — M79604 Pain in right leg: Secondary | ICD-10-CM | POA: Diagnosis not present

## 2015-03-22 DIAGNOSIS — E119 Type 2 diabetes mellitus without complications: Secondary | ICD-10-CM | POA: Insufficient documentation

## 2015-03-22 DIAGNOSIS — I10 Essential (primary) hypertension: Secondary | ICD-10-CM | POA: Diagnosis not present

## 2015-03-22 DIAGNOSIS — K589 Irritable bowel syndrome without diarrhea: Secondary | ICD-10-CM | POA: Diagnosis not present

## 2015-03-22 DIAGNOSIS — M1990 Unspecified osteoarthritis, unspecified site: Secondary | ICD-10-CM | POA: Insufficient documentation

## 2015-03-22 DIAGNOSIS — Z87891 Personal history of nicotine dependence: Secondary | ICD-10-CM | POA: Insufficient documentation

## 2015-03-22 DIAGNOSIS — F329 Major depressive disorder, single episode, unspecified: Secondary | ICD-10-CM | POA: Diagnosis not present

## 2015-03-22 DIAGNOSIS — Z79899 Other long term (current) drug therapy: Secondary | ICD-10-CM | POA: Insufficient documentation

## 2015-03-22 DIAGNOSIS — Z862 Personal history of diseases of the blood and blood-forming organs and certain disorders involving the immune mechanism: Secondary | ICD-10-CM | POA: Diagnosis not present

## 2015-03-22 DIAGNOSIS — Z8701 Personal history of pneumonia (recurrent): Secondary | ICD-10-CM | POA: Insufficient documentation

## 2015-03-22 DIAGNOSIS — G8929 Other chronic pain: Secondary | ICD-10-CM | POA: Insufficient documentation

## 2015-03-22 DIAGNOSIS — M79661 Pain in right lower leg: Secondary | ICD-10-CM | POA: Diagnosis not present

## 2015-03-22 DIAGNOSIS — M542 Cervicalgia: Secondary | ICD-10-CM | POA: Diagnosis not present

## 2015-03-22 DIAGNOSIS — R079 Chest pain, unspecified: Secondary | ICD-10-CM | POA: Diagnosis not present

## 2015-03-22 DIAGNOSIS — R0789 Other chest pain: Secondary | ICD-10-CM | POA: Diagnosis not present

## 2015-03-22 DIAGNOSIS — I252 Old myocardial infarction: Secondary | ICD-10-CM | POA: Diagnosis not present

## 2015-03-22 DIAGNOSIS — M25512 Pain in left shoulder: Secondary | ICD-10-CM | POA: Diagnosis not present

## 2015-03-22 DIAGNOSIS — G43909 Migraine, unspecified, not intractable, without status migrainosus: Secondary | ICD-10-CM | POA: Diagnosis not present

## 2015-03-22 DIAGNOSIS — K219 Gastro-esophageal reflux disease without esophagitis: Secondary | ICD-10-CM | POA: Diagnosis not present

## 2015-03-22 LAB — BASIC METABOLIC PANEL
Anion gap: 7 (ref 5–15)
BUN: 6 mg/dL (ref 6–20)
CO2: 28 mmol/L (ref 22–32)
Calcium: 9.1 mg/dL (ref 8.9–10.3)
Chloride: 103 mmol/L (ref 101–111)
Creatinine, Ser: 0.82 mg/dL (ref 0.44–1.00)
GFR calc Af Amer: >60 mL/min (ref >60)
GFR calc non Af Amer: >60 mL/min (ref >60)
Glucose, Bld: 100 mg/dL — ABNORMAL HIGH (ref 65–99)
Potassium: 4 mmol/L (ref 3.5–5.1)
Sodium: 138 mmol/L (ref 135–145)

## 2015-03-22 LAB — CBC WITH DIFFERENTIAL/PLATELET
Basophils Absolute: 0 10*3/uL (ref 0.0–0.1)
Basophils Relative: 0 % (ref 0–1)
Eosinophils Absolute: 0.3 10*3/uL (ref 0.0–0.7)
Eosinophils Relative: 5 % (ref 0–5)
HCT: 33.1 % — ABNORMAL LOW (ref 36.0–46.0)
Hemoglobin: 10.7 g/dL — ABNORMAL LOW (ref 12.0–15.0)
Lymphocytes Relative: 42 % (ref 12–46)
Lymphs Abs: 2.5 10*3/uL (ref 0.7–4.0)
MCH: 28.5 pg (ref 26.0–34.0)
MCHC: 32.3 g/dL (ref 30.0–36.0)
MCV: 88.3 fL (ref 78.0–100.0)
Monocytes Absolute: 0.4 10*3/uL (ref 0.1–1.0)
Monocytes Relative: 6 % (ref 3–12)
Neutro Abs: 2.8 10*3/uL (ref 1.7–7.7)
Neutrophils Relative %: 47 % (ref 43–77)
Platelets: 379 10*3/uL (ref 150–400)
RBC: 3.75 MIL/uL — ABNORMAL LOW (ref 3.87–5.11)
RDW: 14.9 % (ref 11.5–15.5)
WBC: 6 10*3/uL (ref 4.0–10.5)

## 2015-03-22 LAB — I-STAT TROPONIN, ED
Troponin i, poc: 0 ng/mL (ref 0.00–0.08)
Troponin i, poc: 0 ng/mL (ref 0.00–0.08)

## 2015-03-22 MED ORDER — HYDROMORPHONE HCL 1 MG/ML IJ SOLN
1.0000 mg | Freq: Once | INTRAMUSCULAR | Status: AC
  Administered 2015-03-22: 1 mg via INTRAVENOUS
  Filled 2015-03-22: qty 1

## 2015-03-22 MED ORDER — ISOSORBIDE MONONITRATE ER 30 MG PO TB24: 30.0000 mg | ORAL_TABLET | Freq: Every day | ORAL | Status: DC

## 2015-03-22 NOTE — Discharge Instructions (Signed)
Chest Pain (Nonspecific) Follow up with your cardiologist. Take medications as prescribed. Return for increased chest pain or shortness of breath. It is often hard to give a specific diagnosis for the cause of chest pain. There is always a chance that your pain could be related to something serious, such as a heart attack or a blood clot in the lungs. You need to follow up with your health care provider for further evaluation. CAUSES   Heartburn.  Pneumonia or bronchitis.  Anxiety or stress.  Inflammation around your heart (pericarditis) or lung (pleuritis or pleurisy).  A blood clot in the lung.  A collapsed lung (pneumothorax). It can develop suddenly on its own (spontaneous pneumothorax) or from trauma to the chest.  Shingles infection (herpes zoster virus). The chest wall is composed of bones, muscles, and cartilage. Any of these can be the source of the pain.  The bones can be bruised by injury.  The muscles or cartilage can be strained by coughing or overwork.  The cartilage can be affected by inflammation and become sore (costochondritis). DIAGNOSIS  Lab tests or other studies may be needed to find the cause of your pain. Your health care provider may have you take a test called an ambulatory electrocardiogram (ECG). An ECG records your heartbeat patterns over a 24-hour period. You may also have other tests, such as:  Transthoracic echocardiogram (TTE). During echocardiography, sound waves are used to evaluate how blood flows through your heart.  Transesophageal echocardiogram (TEE).  Cardiac monitoring. This allows your health care provider to monitor your heart rate and rhythm in real time.  Holter monitor. This is a portable device that records your heartbeat and can help diagnose heart arrhythmias. It allows your health care provider to track your heart activity for several days, if needed.  Stress tests by exercise or by giving medicine that makes the heart beat  faster. TREATMENT   Treatment depends on what may be causing your chest pain. Treatment may include:  Acid blockers for heartburn.  Anti-inflammatory medicine.  Pain medicine for inflammatory conditions.  Antibiotics if an infection is present.  You may be advised to change lifestyle habits. This includes stopping smoking and avoiding alcohol, caffeine, and chocolate.  You may be advised to keep your head raised (elevated) when sleeping. This reduces the chance of acid going backward from your stomach into your esophagus. Most of the time, nonspecific chest pain will improve within 2-3 days with rest and mild pain medicine.  HOME CARE INSTRUCTIONS   If antibiotics were prescribed, take them as directed. Finish them even if you start to feel better.  For the next few days, avoid physical activities that bring on chest pain. Continue physical activities as directed.  Do not use any tobacco products, including cigarettes, chewing tobacco, or electronic cigarettes.  Avoid drinking alcohol.  Only take medicine as directed by your health care provider.  Follow your health care provider's suggestions for further testing if your chest pain does not go away.  Keep any follow-up appointments you made. If you do not go to an appointment, you could develop lasting (chronic) problems with pain. If there is any problem keeping an appointment, call to reschedule. SEEK MEDICAL CARE IF:   Your chest pain does not go away, even after treatment.  You have a rash with blisters on your chest.  You have a fever. SEEK IMMEDIATE MEDICAL CARE IF:   You have increased chest pain or pain that spreads to your arm, neck, jaw,  back, or abdomen.  You have shortness of breath.  You have an increasing cough, or you cough up blood.  You have severe back or abdominal pain.  You feel nauseous or vomit.  You have severe weakness.  You faint.  You have chills. This is an emergency. Do not wait to  see if the pain will go away. Get medical help at once. Call your local emergency services (911 in [place] Do not drive yourself to the hospital. MAKE SURE YOU:   Understand these instructions.  Will watch your condition.  Will get help right away if you are not doing well or get worse. Document Released: 05/28/2005 Document Revised: 08/23/2013 Document Reviewed: 03/23/2008 [facility] Patient Information 2015 [place], [place]. This information is not intended to replace advice given to you by your health care provider. Make sure you discuss any questions you have with your health care provider.

## 2015-03-22 NOTE — Telephone Encounter (Signed)
Pt stated that pain had gone away in chest and then moved to chin/neck area.  Pt took medication as ordered with 8 oz glass of Kool-Aide and was sitting on the edge of the bed doing something and started to feel BP dropping and felt funny.  Pt laiddown on bed and "felt like she was floating" and area around collar bone is started to swell. Pt stated she feels horrible. Pt stated she has taken some nausea medications and will continue to drink fluids but she is on the way to the hospital [facility]) as she just does not feel right and chest pain is slowly coming back.   Before speaking with pt spoke with R. Barrett, [employer] [facility] and she stated pt needs to drink fluids, lay down with feet elevated, and to take 30 mg BID tomorrow to see if that make a difference. If not then pt needs to be evaluated in the ED.

## 2015-03-22 NOTE — ED Notes (Signed)
Pt presents with onset of neck and upper chest pain that began yesterday.  Pt reports taking her heart medication that relieved pain but pain returned and radiates into L shoulder. +nausea and shortness of breath.

## 2015-03-22 NOTE — Telephone Encounter (Signed)
New Message    Pt c/o of Chest Pain: STAT if CP now or developed within 24 hours  1. Are you having CP right now?  YES  2. Are you experiencing any other symptoms (ex. SOB, nausea, vomiting, sweating)?  Her left shoulder is painful and it moved down to her hand  3. How long have you been experiencing CP?  Its the same since she called last time 03/16/15  4. Is your CP continuous or coming and going? Continuous and it stays   5. Have you taken Nitroglycerin? yes ?

## 2015-03-22 NOTE — Telephone Encounter (Signed)
Additional message: pt's husband called back to state pt took extra med as instructed earlier , felt better for awhile, now having pain again under her chin, denies pain in chest and SOB, some lightheadedness pls advise [phone] or [phone]

## 2015-03-22 NOTE — ED Provider Notes (Signed)
CSN: [id number]     Arrival date & time 03/22/15  1216 History   First MD Initiated Contact with Patient 03/22/15 1230   Chief complaint: chest pain   (Consider location/radiation/quality/duration/timing/severity/associated sxs/prior Treatment) The history is provided by the patient and the spouse. No language interpreter was used.   Ms. 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  She rates the pain at 7/10.  She took 2 percocet this morning for the pain which has not helped. She is also complaining of right calf pain since yesterday as well. She was recently diagnosed with coronary vasospasms in April 2016 and put on Imdur.  She denies any family history of cardiac disease. She denies smoking. She has an extensive history of PE's in the past and has been on and is compliant with eliquis.  Past Medical History  Diagnosis Date  . Spinal stenosis   . Hypertension   . Pulmonary embolism 04/2001; 09/19/2014    "after gallbladder OR; "  . High cholesterol   . Myocardial infarction 10/2010 X 3    "while hospitalized"  . Pneumonia 04/2014  . Sleep apnea     "mild" (09/19/2014)  . Type II diabetes mellitus     "dx'd in 2007; lost weight; no RX for ~ 4 yr now" (09/19/2014)  . Anemia   . History of blood transfusion 1985    related to hysterectomy  . Esophageal spasm   . Gastritis   . Gastroenteritis   . IBS (irritable bowel syndrome)   . GERD (gastroesophageal reflux disease)   . Headache     "couple /month lately" (09/19/2014)  . Migraine     "a few times/yr" (09/19/2014)  . Osteoarthritis     "qwhere; mostly around my joints" (09/19/2014)  . Spondylosis   . Chronic  back pain     "upper and lower" (09/19/2014)  . Depression     "major" (09/19/2014)  . PTSD (post-traumatic stress disorder)   . Schizoaffective disorder   . Snoring     a. sleep study 5/16: No OSA   Past Surgical History  Procedure Laterality Date  . Excision/release bursa hip Right 12/1984  . Total hip arthroplasty Left 04-06-2014  . Tonsillectomy and adenoidectomy  ~ 1968  . Appendectomy  1985  . Abdominal hysterectomy  1985  . Cholecystectomy  2002  . Hernia repair  1985  . Umbilical hernia repair  1985  . Vena cava filter placement  03/2014  . Back surgery    . Anterior cervical decomp/discectomy fusion  10/2012  . Breast cyst excision Right   . Cardiac catheterization   2000's; 2009  . Left heart catheterization with coronary angiogram N/A 12/09/2014    Procedure: LEFT HEART CATHETERIZATION WITH CORONARY ANGIOGRAM;  Surgeon: Burnell Blanks, MD;  Location: [facility] CATH LAB;  Service: Cardiovascular;  Laterality: N/A;   Family History  Problem Relation Age of Onset  . Heart attack Neg Hx   . Stroke Mother    History  Substance Use Topics  . Smoking status: Former Smoker -- 1.50 packs/day for 10 years    Types: Cigarettes    Quit date: 04/12/1979  . Smokeless tobacco: Never  Used  . Alcohol Use: Yes     Comment: 09/19/2014 "nothing in the last 6 months"   OB History    No data available     Review of Systems  Constitutional: Negative for fever and chills.  Cardiovascular: Positive for chest pain.  Neurological: Negative for weakness and numbness.  All other systems reviewed and are negative.     Allergies  Abilify; Augmentin; Bactrim; Ceftin; Diclofenac; Indocin; Linaclotide; Lisinopril; Morphine; Morphine and related; Simvastatin; Sulfa antibiotics; Sulfamethoxazole-trimethoprim; Wellbutrin; and Quetiapine fumarate  Home Medications   Prior to Admission medications   Medication Sig Start Date End Date Taking? Authorizing Provider  apixaban (ELIQUIS) 5 MG  TABS tablet Take 1 tablet (5 mg total) by mouth 2 (two) times daily. 12/12/14  Yes Rhonda G Barrett, PA-C  carvedilol (COREG) 3.125 MG tablet Take 1 tablet (3.125 mg total) by mouth 2 (two) times daily with a meal. 01/08/15  Yes Scott T Kathlen Mody, PA-C  cyclobenzaprine (FLEXERIL) 10 MG tablet Take 1 tablet (10 mg total) by mouth every 8 (eight) hours. 02/28/15  Yes [name], MD  dicyclomine (BENTYL) 10 MG capsule Take 10 mg by mouth 4 (four) times daily as needed for spasms.    Yes Historical Provider, MD  diltiazem (TIAZAC) 120 MG 24 hr capsule Take 120 mg by mouth daily.   Yes Historical Provider, MD  diphenhydrAMINE (BENADRYL) 25 MG tablet Take 25 mg by mouth every 8 (eight) hours as needed for allergies.    Yes Historical Provider, MD  escitalopram (LEXAPRO) 10 MG tablet TAKE 1 TABLET BY MOUTH EVERY DAY 03/08/15  Yes [name], MD  furosemide (LASIX) 20 MG tablet Take 20 mg by mouth daily as needed for fluid or edema.   Yes Historical Provider, MD  gabapentin (NEURONTIN) 600 MG tablet Take 600 mg by mouth 3 (three) times daily.    Yes Historical Provider, MD  isosorbide mononitrate (IMDUR) 30 MG 24 hr tablet Take 1 tablet (30 mg total) by mouth daily. 03/03/15  Yes Davonna Belling, MD  Multiple Vitamin (MULTIVITAMIN WITH MINERALS) TABS tablet Take 2 tablets by mouth daily.    Yes Historical Provider, MD  NIFEdipine (PROCARDIA-XL/ADALAT-CC/NIFEDICAL-XL) 30 MG 24 hr tablet Take 30 mg by mouth daily.   Yes Historical Provider, MD  nitroGLYCERIN (NITROSTAT) 0.4 MG SL tablet Place 1 tablet (0.4 mg total) under the tongue every 5 (five) minutes as needed for chest pain. Take 1 NTG, under your tongue, while sitting. If no relief of pain may repeat NTG, one tab every 5 minutes up to 3 tablets total over 15 minutes. 03/20/15  Yes Rhonda G Barrett, PA-C  ondansetron (ZOFRAN) 4 MG tablet Take 1 tablet (4 mg total) by mouth every 6 (six) hours as needed for nausea or vomiting. 12/12/14  Yes Rhonda G Barrett,  PA-C  oxyCODONE-acetaminophen (PERCOCET) 10-325 MG per tablet Take 1 tablet by mouth every 6 (six) hours as needed for pain. 02/13/15  Yes [name], MD  pantoprazole (PROTONIX) 40 MG tablet Take 40 mg by mouth at bedtime.   Yes Historical Provider, MD  traZODone (DESYREL) 50 MG tablet Take 50 mg by mouth at bedtime.   Yes Historical Provider, MD  Lubiprostone (AMITIZA PO) Take 1 tablet by mouth 2 (two) times daily.     Historical Provider, MD   BP 127/71 mmHg  Pulse 75  Temp(Src) 98.3 F (36.8 C) (Oral)  Resp 22  Ht 5\' 6"  (1.676 m)  Wt 207 lb (93.895 kg)  BMI 33.43  kg/m2  SpO2 99% Physical Exam  Constitutional: She is oriented to person, place, and time. She appears well-developed and well-nourished.  HENT:  Head: Normocephalic and atraumatic.  Eyes: Conjunctivae are normal.  Neck: Normal range of motion. Neck supple.  Cardiovascular: Normal rate, regular rhythm and normal heart sounds.   Pulmonary/Chest: Effort normal and breath sounds normal. No respiratory distress. She has no decreased breath sounds. She has no wheezes. She exhibits tenderness.  Tenderness to palpation of the left chest wall, neck, and left shoulder.   Abdominal: Soft. There is no tenderness.  Musculoskeletal: Normal range of motion. She exhibits tenderness. She exhibits no edema.  Right calf tenderness to palpation.   Neurological: She is alert and oriented to person, place, and time.  Skin: Skin is warm and dry.  Psychiatric: She has a normal mood and affect. Her behavior is normal.  Nursing note and vitals reviewed.   ED Course  Procedures (including critical care time) Labs Review Labs Reviewed  CBC WITH DIFFERENTIAL/PLATELET - Abnormal; Notable for the following:    RBC 3.75 (*)    Hemoglobin 10.7 (*)    HCT 33.1 (*)    All other components within normal limits  BASIC METABOLIC PANEL - Abnormal; Notable for the following:    Glucose, Bld 100 (*)    All other components within normal limits   I-STAT TROPOININ, ED  Randolm Idol, ED    Imaging Review Dg Chest 2 View  03/22/2015   CLINICAL DATA:  Chest pain.  EXAM: CHEST  2 VIEW  COMPARISON:  March 03, 2015.  FINDINGS: The heart size and mediastinal contours are within normal limits. Both lungs are clear. No pneumothorax is noted. Minimal bilateral pleural effusions are noted. The visualized skeletal structures are unremarkable.  IMPRESSION: Minimal bilateral pleural effusions. No other abnormality seen in the chest.   Electronically Signed   By: Marijo Conception, M.D.   On: 03/22/2015 13:55   Dg Shoulder Left  03/22/2015   CLINICAL DATA:  Left shoulder pain, no known injury, initial encounter  EXAM: LEFT SHOULDER - 2+ VIEW  COMPARISON:  None.  FINDINGS: There is no evidence of fracture or dislocation. There is no evidence of arthropathy or other focal bone abnormality. Soft tissues are unremarkable.  IMPRESSION: No acute abnormality noted.   Electronically Signed   By: Inez Catalina M.D.   On: 03/22/2015 13:52     EKG Interpretation   Date/Time:  Thursday March 22 2015 12:28:04 EDT Ventricular Rate:  82 PR Interval:  154 QRS Duration: 86 QT Interval:  392 QTC Calculation: 457 R Axis:   14 Text Interpretation:  Normal sinus rhythm T wave abnormality, consider  inferior ischemia T wave abnormality, consider anterolateral ischemia  Abnormal ECG No Change vs 03-03-2015 Confirmed by Jeneen Rinks  MD, [employer] ([id number]) on  03/22/2015 1:15:43 PM      MDM   Final diagnoses:  Chest pain, unspecified chest pain type  Patient presents for multiple complaints including chest and calf pain. She is currently on Eliquis and has an IVC due to multiple PE's. She is followed closely by cardiology and they believe her pain is related to coronary vasospasms. She had a normal heart cath in April 2016. ECHO showed normal EF of 55-60%.  Today, her exam is not concerning and her pain is reproducible with palpation. Her labs are unremarkable and troponin is  negative.  Her EKG is not concerning for acute MI. Her xray shows minimal bilateral pleural effusions which  I do not think is the cause of her pain or symptoms.  She has a normal left shoulder xray.  Since she is already on eliquis, I do not believe her right calf needs a doppler to evaluate for DVT.   Medications  HYDROmorphone (DILAUDID) injection 1 mg (1 mg Intravenous Given 03/22/15 1432)  I discussed this patient with Dr. Jeneen Rinks, M who has seen the patient and agrees with the plan to discharge the patient pending negative second troponin.  Second troponin is negative. I discussed return precautions. Patient also agrees with the plan and has no unaddressed concerns.     Ottie Glazier, PA-C 03/22/15 [place], MD 03/31/15 [phone]

## 2015-03-22 NOTE — Telephone Encounter (Signed)
Pt calling with CP that has been going on for a few days, was evaluated by R. Barrett, PA on 7/19 in our office for CP.  Pt was has been taking Imdur 30 mg as prescribed (Qdaily) and an additional dose yesterday as it wears after a few hours. Pt took an additional dose early this morning shortly after midnight and now pain is back including right shoulder pain and tingling and numbness that has not made it all the way to her fingers yet.  Pt has a dose of Procardia 30 mg daily that Dr. Radford Pax ordered a few days ago and wants to know if okay to take this too or what to do next.  Pt stated that she has no c/o of SOB. Pt has not taken SL Nitro as she has not picked it up from the pharmacy. Shared pt c/o with Flex, R. Barrett, PA.- ordered that pt take 60 mg of Imdur, go Pharmacy to pick up Nitro and to check BP while there.  Pt to call our office if she notices this helps so we can adjust her dose.  Pt educated on medication limitations due to soft BP's and trying to find a balance for her BP and CP management. Pt agrees no additional questions at this time.

## 2015-03-29 ENCOUNTER — Other Ambulatory Visit: Payer: Self-pay | Admitting: Family Medicine

## 2015-03-29 DIAGNOSIS — K034 Hypercementosis: Secondary | ICD-10-CM

## 2015-03-29 MED ORDER — OXYCODONE-ACETAMINOPHEN 10-325 MG PO TABS: 1.0000 | ORAL_TABLET | Freq: Four times a day (QID) | ORAL | Status: DC | PRN

## 2015-03-29 NOTE — Telephone Encounter (Signed)
Patient requesting refill on Oxycodone. 

## 2015-03-29 NOTE — Telephone Encounter (Signed)
Refill request was sent to Dr. Ashany Sundaram for approval and submission.  

## 2015-04-03 ENCOUNTER — Other Ambulatory Visit: Payer: Self-pay | Admitting: Family Medicine

## 2015-04-03 DIAGNOSIS — K219 Gastro-esophageal reflux disease without esophagitis: Secondary | ICD-10-CM

## 2015-04-03 DIAGNOSIS — G894 Chronic pain syndrome: Secondary | ICD-10-CM

## 2015-04-04 ENCOUNTER — Other Ambulatory Visit: Payer: Self-pay

## 2015-04-04 DIAGNOSIS — G894 Chronic pain syndrome: Secondary | ICD-10-CM

## 2015-04-04 MED ORDER — GABAPENTIN 600 MG PO TABS: 600.0000 mg | ORAL_TABLET | Freq: Three times a day (TID) | ORAL | Status: DC

## 2015-04-04 NOTE — Telephone Encounter (Signed)
Received a fax from [facility] requesting a refill of Gabapentin 600mg .  Refill request was sent to Dr. [name] for approval and submission.

## 2015-04-07 ENCOUNTER — Other Ambulatory Visit: Payer: Self-pay | Admitting: Family Medicine

## 2015-04-07 DIAGNOSIS — K589 Irritable bowel syndrome without diarrhea: Secondary | ICD-10-CM

## 2015-04-23 ENCOUNTER — Ambulatory Visit: Payer: Self-pay | Admitting: Cardiovascular Disease

## 2015-04-23 ENCOUNTER — Other Ambulatory Visit: Payer: Self-pay | Admitting: Physician Assistant

## 2015-04-23 ENCOUNTER — Other Ambulatory Visit: Payer: Self-pay

## 2015-04-23 DIAGNOSIS — G4700 Insomnia, unspecified: Secondary | ICD-10-CM

## 2015-04-23 DIAGNOSIS — G894 Chronic pain syndrome: Secondary | ICD-10-CM

## 2015-04-23 NOTE — Telephone Encounter (Signed)
Got a call from [place] with [facility] requesting a refill of Trazadone (50mg ) & Gabapentin (600mg ) to be sent in since they have been unsuccessful with faxing the request to our office.  Refill request was sent to Dr. [name] for approval and submission.

## 2015-04-24 MED ORDER — TRAZODONE HCL 50 MG PO TABS: 50.0000 mg | ORAL_TABLET | Freq: Every day | ORAL | Status: DC

## 2015-04-24 MED ORDER — GABAPENTIN 600 MG PO TABS: 600.0000 mg | ORAL_TABLET | Freq: Three times a day (TID) | ORAL | Status: DC

## 2015-04-27 ENCOUNTER — Encounter: Payer: Self-pay | Admitting: Gastroenterology

## 2015-05-02 ENCOUNTER — Other Ambulatory Visit: Payer: Self-pay | Admitting: Family Medicine

## 2015-05-02 DIAGNOSIS — K034 Hypercementosis: Secondary | ICD-10-CM

## 2015-05-02 NOTE — Telephone Encounter (Signed)
Pt states she needs refill on generic for percocet.

## 2015-05-02 NOTE — Telephone Encounter (Signed)
Refill request was sent to Dr. Ashany Sundaram for approval and submission.  

## 2015-05-03 MED ORDER — OXYCODONE-ACETAMINOPHEN 10-325 MG PO TABS: 1.0000 | ORAL_TABLET | Freq: Four times a day (QID) | ORAL | Status: DC | PRN

## 2015-05-04 DIAGNOSIS — M5412 Radiculopathy, cervical region: Secondary | ICD-10-CM | POA: Diagnosis not present

## 2015-05-04 DIAGNOSIS — M75102 Unspecified rotator cuff tear or rupture of left shoulder, not specified as traumatic: Secondary | ICD-10-CM | POA: Diagnosis not present

## 2015-05-04 DIAGNOSIS — M5030 Other cervical disc degeneration, unspecified cervical region: Secondary | ICD-10-CM | POA: Diagnosis not present

## 2015-05-24 ENCOUNTER — Emergency Department (HOSPITAL_COMMUNITY): Payer: Medicare Other

## 2015-05-24 ENCOUNTER — Encounter (HOSPITAL_COMMUNITY): Payer: Self-pay

## 2015-05-24 ENCOUNTER — Emergency Department (HOSPITAL_COMMUNITY)
Admission: EM | Admit: 2015-05-24 | Discharge: 2015-05-25 | Disposition: A | Discharge status: Home / Self Care | Payer: Medicare Other | Attending: Emergency Medicine | Admitting: Emergency Medicine

## 2015-05-24 DIAGNOSIS — Z79899 Other long term (current) drug therapy: Secondary | ICD-10-CM | POA: Insufficient documentation

## 2015-05-24 DIAGNOSIS — R0789 Other chest pain: Secondary | ICD-10-CM | POA: Insufficient documentation

## 2015-05-24 DIAGNOSIS — Z8739 Personal history of other diseases of the musculoskeletal system and connective tissue: Secondary | ICD-10-CM | POA: Insufficient documentation

## 2015-05-24 DIAGNOSIS — I10 Essential (primary) hypertension: Secondary | ICD-10-CM | POA: Diagnosis not present

## 2015-05-24 DIAGNOSIS — G8929 Other chronic pain: Secondary | ICD-10-CM | POA: Diagnosis not present

## 2015-05-24 DIAGNOSIS — Z87891 Personal history of nicotine dependence: Secondary | ICD-10-CM | POA: Insufficient documentation

## 2015-05-24 DIAGNOSIS — K219 Gastro-esophageal reflux disease without esophagitis: Secondary | ICD-10-CM | POA: Diagnosis not present

## 2015-05-24 DIAGNOSIS — F329 Major depressive disorder, single episode, unspecified: Secondary | ICD-10-CM | POA: Insufficient documentation

## 2015-05-24 DIAGNOSIS — R079 Chest pain, unspecified: Secondary | ICD-10-CM | POA: Diagnosis not present

## 2015-05-24 DIAGNOSIS — Z8701 Personal history of pneumonia (recurrent): Secondary | ICD-10-CM | POA: Diagnosis not present

## 2015-05-24 DIAGNOSIS — I252 Old myocardial infarction: Secondary | ICD-10-CM | POA: Insufficient documentation

## 2015-05-24 DIAGNOSIS — G43909 Migraine, unspecified, not intractable, without status migrainosus: Secondary | ICD-10-CM | POA: Insufficient documentation

## 2015-05-24 DIAGNOSIS — Z86711 Personal history of pulmonary embolism: Secondary | ICD-10-CM | POA: Insufficient documentation

## 2015-05-24 DIAGNOSIS — E119 Type 2 diabetes mellitus without complications: Secondary | ICD-10-CM | POA: Insufficient documentation

## 2015-05-24 DIAGNOSIS — Z862 Personal history of diseases of the blood and blood-forming organs and certain disorders involving the immune mechanism: Secondary | ICD-10-CM | POA: Insufficient documentation

## 2015-05-24 LAB — BASIC METABOLIC PANEL
Anion gap: 6 (ref 5–15)
BUN: 8 mg/dL (ref 6–20)
CO2: 26 mmol/L (ref 22–32)
Calcium: 9.1 mg/dL (ref 8.9–10.3)
Chloride: 108 mmol/L (ref 101–111)
Creatinine, Ser: 0.81 mg/dL (ref 0.44–1.00)
GFR calc Af Amer: >60 mL/min (ref >60)
GFR calc non Af Amer: >60 mL/min (ref >60)
Glucose, Bld: 94 mg/dL (ref 65–99)
Potassium: 3.8 mmol/L (ref 3.5–5.1)
Sodium: 140 mmol/L (ref 135–145)

## 2015-05-24 LAB — CBC
HCT: 32 % — ABNORMAL LOW (ref 36.0–46.0)
Hemoglobin: 10.3 g/dL — ABNORMAL LOW (ref 12.0–15.0)
MCH: 29.3 pg (ref 26.0–34.0)
MCHC: 32.2 g/dL (ref 30.0–36.0)
MCV: 91.2 fL (ref 78.0–100.0)
Platelets: 306 10*3/uL (ref 150–400)
RBC: 3.51 MIL/uL — ABNORMAL LOW (ref 3.87–5.11)
RDW: 14.7 % (ref 11.5–15.5)
WBC: 6 10*3/uL (ref 4.0–10.5)

## 2015-05-24 LAB — PROTIME-INR
INR: 1.12 (ref 0.00–1.49)
Prothrombin Time: 14.6 seconds (ref 11.6–15.2)

## 2015-05-24 LAB — I-STAT TROPONIN, ED: Troponin i, poc: 0 ng/mL (ref 0.00–0.08)

## 2015-05-24 MED ORDER — GI COCKTAIL ~~LOC~~
30.0000 mL | Freq: Once | ORAL | Status: AC
  Administered 2015-05-24: 30 mL via ORAL
  Filled 2015-05-24: qty 30

## 2015-05-24 NOTE — ED Provider Notes (Signed)
CSN: [id number]     Arrival date & time 05/24/15  2031 History   This chart was scribed for Laretha Luepke, MD by Forrestine Him, ED Scribe. This patient was seen in room B16C/B16C and the patient's care was started 11:39 PM.   Chief Complaint  Patient presents with  . Chest Pain   Patient is a 59 y.o. female presenting with chest pain. The history is provided by the patient. No language interpreter was used.  Chest Pain Pain location:  Substernal area Pain quality: dull   Pain radiates to:  Does not radiate Pain radiates to the back: no   Pain severity:  Moderate Onset quality:  Gradual Duration:  1 week Timing:  Intermittent Progression:  Unchanged Chronicity:  New Context: not lifting   Relieved by:  None tried Worsened by:  Nothing tried Ineffective treatments:  None tried Associated symptoms: no abdominal pain, no back pain, no fever, no headache, no nausea, no palpitations, no shortness of breath and not vomiting   Risk factors: high cholesterol and hypertension     HPI Comments: Valerie Vazquez is a 59 y.o. female with a PMHx of HTN, pulmonary embolism, hyperlipidemia, MI, DM, and GERD who presents to the Emergency Department complaining of intermittent, ongoing central chest pain x 1 week. Pain is described as dull with each episode lasting only a few seconds. No aggravating or alleviating factors at this time. Ongoing weakness and dizziness also reported. No OTC/prescribed medications attempted prior to arrival. No recent fever, chills, nausea, vomiting, abdominal pain, or shortness of breath. Valerie Vazquez was evaluated in [place] of this year for similar and was diagnosed with arterial spasm.  She is followed by Dr. Lauree Chandler  Past Medical History  Diagnosis Date  . Spinal stenosis   . Hypertension   . Pulmonary embolism 04/2001; 09/19/2014    "after gallbladder OR; "  . High cholesterol   . Myocardial infarction 10/2010 X 3    "while hospitalized"  .  Pneumonia 04/2014  . Sleep apnea     "mild" (09/19/2014)  . Type II diabetes mellitus     "dx'd in 2007; lost weight; no RX for ~ 4 yr now" (09/19/2014)  . Anemia   . History of blood transfusion 1985    related to hysterectomy  . Esophageal spasm   . Gastritis   . Gastroenteritis   . IBS (irritable bowel syndrome)   . GERD (gastroesophageal reflux disease)   . Headache     "couple /month lately" (09/19/2014)  . Migraine     "a few times/yr" (09/19/2014)  . Osteoarthritis     "qwhere; mostly around my joints" (09/19/2014)  . Spondylosis   . Chronic back pain     "upper and lower" (09/19/2014)  . Depression     "major" (09/19/2014)  . PTSD (post-traumatic stress disorder)   . Schizoaffective disorder   . Snoring     a. sleep study 5/16: No OSA   Past Surgical History  Procedure Laterality Date  . Excision/release bursa hip Right 12/1984  . Total hip arthroplasty Left 04-06-2014  . Tonsillectomy and adenoidectomy  ~ 1968  . Appendectomy  1985  . Abdominal hysterectomy  1985  . Cholecystectomy  2002  . Hernia repair  1985  . Umbilical hernia repair  1985  . Vena cava filter placement  03/2014  . Back surgery    . Anterior cervical decomp/discectomy fusion  10/2012  . Breast cyst excision Right   . Cardiac catheterization  2000's; 2009  . Left heart catheterization with coronary angiogram N/A 12/09/2014    Procedure: LEFT HEART CATHETERIZATION WITH CORONARY ANGIOGRAM;  Surgeon: Burnell Blanks, MD;  Location: [facility] CATH LAB;  Service: Cardiovascular;  Laterality: N/A;   Family History  Problem Relation Age of Onset  . Heart attack Neg Hx   . Stroke Mother    Social History  Substance Use Topics  . Smoking status: Former Smoker -- 1.50 packs/day for 10 years    Types: Cigarettes    Quit date: 04/12/1979  . Smokeless tobacco: Never Used  . Alcohol Use: Yes     Comment: 09/19/2014 "nothing in the last 6 months"   OB History    No data available     Review of Systems   Constitutional: Negative for fever and chills.  HENT: Negative for congestion and rhinorrhea.   Respiratory: Negative for choking, shortness of breath and wheezing.   Cardiovascular: Positive for chest pain. Negative for palpitations and leg swelling.  Gastrointestinal: Negative for nausea, vomiting, abdominal pain and diarrhea.  Musculoskeletal: Negative for back pain.  Skin: Negative for rash.  Neurological: Negative for headaches.  Psychiatric/Behavioral: Negative for confusion.      Allergies  Abilify; Augmentin; Bactrim; Ceftin; Diclofenac; Indocin; Linaclotide; Lisinopril; Morphine; Morphine and related; Simvastatin; Sulfa antibiotics; Sulfamethoxazole-trimethoprim; Wellbutrin; and Quetiapine fumarate  Home Medications   Prior to Admission medications   Medication Sig Start Date End Date Taking? Authorizing Provider  apixaban (ELIQUIS) 5 MG TABS tablet Take 1 tablet (5 mg total) by mouth 2 (two) times daily. 12/12/14  Yes Rhonda G Barrett, PA-C  BIOTIN PO Take 1 tablet by mouth daily.   Yes Historical Provider, MD  carvedilol (COREG) 3.125 MG tablet Take 1 tablet (3.125 mg total) by mouth 2 (two) times daily with a meal. 01/08/15  Yes Scott T Kathlen Mody, PA-C  cyclobenzaprine (FLEXERIL) 10 MG tablet Take 1 tablet (10 mg total) by mouth every 8 (eight) hours. 02/28/15  Yes [name], MD  dicyclomine (BENTYL) 10 MG capsule TAKE 2 CAPSULES BY MOUTH EVERY SIX HOURS 04/09/15  Yes [name], MD  diltiazem (CARDIZEM CD) 120 MG 24 hr capsule TAKE ONE CAPSULE BY MOUTH EVERY DAY 04/23/15  Yes Burnell Blanks, MD  diphenhydrAMINE (BENADRYL) 25 MG tablet Take 25 mg by mouth every 8 (eight) hours as needed for allergies.    Yes Historical Provider, MD  escitalopram (LEXAPRO) 10 MG tablet TAKE 1 TABLET BY MOUTH EVERY DAY 03/08/15  Yes [name], MD  furosemide (LASIX) 20 MG tablet Take 20 mg by mouth daily as needed for fluid or edema.   Yes Historical Provider, MD  gabapentin  (NEURONTIN) 600 MG tablet Take 1 tablet (600 mg total) by mouth every 8 (eight) hours. 04/24/15  Yes [name], MD  isosorbide mononitrate (IMDUR) 30 MG 24 hr tablet Take 1 tablet (30 mg total) by mouth daily. 03/22/15  Yes Burnell Blanks, MD  Multiple Vitamin (MULTIVITAMIN WITH MINERALS) TABS tablet Take 2 tablets by mouth daily.    Yes Historical Provider, MD  nitroGLYCERIN (NITROSTAT) 0.4 MG SL tablet Place 1 tablet (0.4 mg total) under the tongue every 5 (five) minutes as needed for chest pain. Take 1 NTG, under your tongue, while sitting. If no relief of pain may repeat NTG, one tab every 5 minutes up to 3 tablets total over 15 minutes. 03/20/15  Yes Rhonda G Barrett, PA-C  ondansetron (ZOFRAN) 4 MG tablet Take 1 tablet (4 mg total) by mouth  every 6 (six) hours as needed for nausea or vomiting. 12/12/14  Yes Rhonda G Barrett, PA-C  oxyCODONE-acetaminophen (PERCOCET) 10-325 MG per tablet Take 1 tablet by mouth every 6 (six) hours as needed for pain. 05/03/15  Yes [name], MD  pantoprazole (PROTONIX) 40 MG tablet TAKE 1 TABLET BY MOUTH EVERY DAY 04/04/15  Yes [name], MD  traZODone (DESYREL) 50 MG tablet Take 1 tablet (50 mg total) by mouth at bedtime. 04/24/15  Yes [name], MD   Triage Vitals: BP 130/68 mmHg  Pulse 80  Temp(Src) 98.1 F (36.7 C) (Oral)  Resp 18  Ht 5\' 6"  (1.676 m)  Wt 204 lb 4 oz (92.647 kg)  BMI 32.98 kg/m2  SpO2 98%   Physical Exam  Constitutional: She is oriented to person, place, and time. She appears well-developed and well-nourished. No distress.  HENT:  Head: Normocephalic and atraumatic.  Mouth/Throat: Oropharynx is clear and moist. No oropharyngeal exudate.  Eyes: EOM are normal. Pupils are equal, round, and reactive to light.  Bilateral cataracts   Neck: Normal range of motion. No JVD present.  No carotid bruits  Trachea is midline  Cardiovascular: Normal rate, regular rhythm and normal heart sounds.   Pulmonary/Chest: Effort  normal and breath sounds normal. No respiratory distress. She has no wheezes. She exhibits no tenderness.  Abdominal: Soft. She exhibits no distension. There is no tenderness. There is no rebound and no guarding.  Hyperactive bowel sounds in epigastrium to lower thoracic cavity    Musculoskeletal: Normal range of motion.  Neurological: She is alert and oriented to person, place, and time.  Skin: Skin is warm and dry.  Psychiatric: She has a normal mood and affect. Judgment normal.  Nursing note and vitals reviewed.   ED Course  Procedures (including critical care time)  DIAGNOSTIC STUDIES: Oxygen Saturation is 98% on RA, Normal by my interpretation.    COORDINATION OF CARE: 11:43 PM- Will give GI cocktail. Will order CXR, i-stat troponin, BMP, CBC, and PT-INR. Discussed treatment plan with pt at bedside and pt agreed to plan.     Labs Review Labs Reviewed  CBC - Abnormal; Notable for the following:    RBC 3.51 (*)    Hemoglobin 10.3 (*)    HCT 32.0 (*)    All other components within normal limits  BASIC METABOLIC PANEL  PROTIME-INR  Randolm Idol, ED    Imaging Review Dg Chest 2 View  05/24/2015   CLINICAL DATA:  Intermittent chest pain described as a dull ache for 1 week. History of a myocardial infarction in 2012. History of diabetes and hypertension.  EXAM: CHEST  2 VIEW  COMPARISON:  03/22/2015  FINDINGS: Cardiac silhouette is borderline enlarged. No mediastinal or hilar masses or evidence of adenopathy.  Lungs are clear.  No pleural effusion or pneumothorax.  There are changes from previous anterior cervical spine fusion, stable.  IMPRESSION: No acute cardiopulmonary disease.   Electronically Signed   By: Lajean Manes M.D.   On: 05/24/2015 21:20   I have personally reviewed and evaluated these images and lab results as part of my medical decision-making.   EKG Interpretation   Date/Time:  Thursday May 24 2015 20:36:00 EDT Ventricular Rate:  84 PR Interval:   154 QRS Duration: 92 QT Interval:  392 QTC Calculation: 463 R Axis:   47 Text Interpretation:  Normal sinus rhythm Confirmed by [facility]  MD,  Tulio Facundo ([id number]) on 05/24/2015 11:49:08 PM      MDM  Final diagnoses:  None    Ruled out for MI with 2 negative troponins and normal EKG, pain was highly atypical for cardiac CP as it lasted only seconds.  I suspect given her exam that this is actually esophageal spasm.  Follow up with your doctor and GI specialist. Strict chest pain return precautions given.  Patient verbalizes understanding and agrees to follow up  I personally performed the services described in this documentation, which was scribed in my presence. The recorded information has been reviewed and is accurate.    Veatrice Kells, MD 05/25/15 [phone]

## 2015-05-24 NOTE — ED Notes (Signed)
Pt reports intermittent, dull, central CP onset one week ago associated with weakness and dizziness. Denies SOB, N/V. Hx of MI and states this does NOT feel similar to then which is why she waited so long to come to the ER. No distress noted.

## 2015-05-25 ENCOUNTER — Encounter: Payer: Self-pay | Admitting: Physician Assistant

## 2015-05-25 ENCOUNTER — Encounter (HOSPITAL_COMMUNITY): Payer: Self-pay | Admitting: Emergency Medicine

## 2015-05-25 DIAGNOSIS — R0789 Other chest pain: Secondary | ICD-10-CM | POA: Diagnosis not present

## 2015-05-25 LAB — I-STAT TROPONIN, ED: Troponin i, poc: 0 ng/mL (ref 0.00–0.08)

## 2015-05-25 NOTE — ED Notes (Signed)
Pt. Left with all belongings. Discharge instructions were reviewed and all questions were answered.  

## 2015-05-31 ENCOUNTER — Other Ambulatory Visit: Payer: Self-pay | Admitting: Cardiovascular Disease

## 2015-06-05 ENCOUNTER — Other Ambulatory Visit: Payer: Self-pay | Admitting: Family Medicine

## 2015-06-05 ENCOUNTER — Encounter: Payer: Self-pay | Admitting: Family Medicine

## 2015-06-05 ENCOUNTER — Ambulatory Visit (INDEPENDENT_AMBULATORY_CARE_PROVIDER_SITE_OTHER): Payer: Medicare Other | Admitting: Family Medicine

## 2015-06-05 VITALS — BP 130/72 | HR 100 | Temp 98.7°F | Resp 16 | Wt 202.8 lb

## 2015-06-05 DIAGNOSIS — G8929 Other chronic pain: Secondary | ICD-10-CM

## 2015-06-05 DIAGNOSIS — K224 Dyskinesia of esophagus: Secondary | ICD-10-CM

## 2015-06-05 DIAGNOSIS — I201 Angina pectoris with documented spasm: Secondary | ICD-10-CM | POA: Diagnosis not present

## 2015-06-05 DIAGNOSIS — K219 Gastro-esophageal reflux disease without esophagitis: Secondary | ICD-10-CM | POA: Diagnosis not present

## 2015-06-05 DIAGNOSIS — R52 Pain, unspecified: Secondary | ICD-10-CM | POA: Diagnosis not present

## 2015-06-05 DIAGNOSIS — M545 Low back pain, unspecified: Secondary | ICD-10-CM | POA: Insufficient documentation

## 2015-06-05 MED ORDER — ONDANSETRON HCL 4 MG PO TABS: 4.0000 mg | ORAL_TABLET | Freq: Three times a day (TID) | ORAL | Status: DC | PRN

## 2015-06-05 MED ORDER — HYDROCODONE-ACETAMINOPHEN 10-325 MG PO TABS
1.0000 | ORAL_TABLET | Freq: Four times a day (QID) | ORAL | Status: DC | PRN
Start: 2015-06-05 — End: 2015-06-29

## 2015-06-05 NOTE — Progress Notes (Signed)
Name: Valerie Vazquez   MRN: [id number]    DOB: 1955/11/28   Date:06/05/2015       Progress Note  Subjective  Chief Complaint  Chief Complaint  Patient presents with  . Hiatal Hernia    patient was told that she has hiatal hernia and has had some esophageal spasms    HPI  Valerie Vazquez is a 59 y.o. female with a PMHx of HTN, multiple pulmonary embolisms, hyperlipidemia, MI, DM, and GERD who is here for general follow up. She was again in the Emergency Department on 05/24/15 complaining of intermittent, ongoing central chest pain x 1 week. Pain was described as dull with each episode lasting only a few seconds. No aggravating or alleviating factors at the time. Ongoing weakness and dizziness also reported. No OTC/prescribed medications attempted prior to arrival. No recent fever, chills, nausea, vomiting, abdominal pain, or shortness of breath. Valerie Vazquez has been evaluated in the ER several times and this most recent time did not reveal ACS. She has a known history of esophageal spasms for which she is on Cardizem CD now for many years. She was referred to gastroenterology specialist 02/20/15 however she was not able to secure appointment as her medicaid card did not have our offices as her primary care office. She had to correct that first which she has done and now she has an appointment secured for later this month. Valerie Vazquez continues to take Protonix 40 mg a day for her reflux but notes occasional brash, reflux, chest pain that is difficult to distinguish from cardiac type chest pain. Recently significant stress as her partner's brother diagnosed with cancer was residing with them but now he has to leave as their housing is subsidized housing which he is not authorized to reside under.   She is followed by Dr. Lauree Chandler, Cardiology. She has not followed up with her cardiologist in some time.  Otherwise she continues to have hip, knee, lower back pain for which she  currently takes Oxycodone-Acetoaminophen and toggles every few months with Hydrocodone-Acetoaminophen. Today she reports having more lower lumbosacral pain than usual. 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 She is requesting a back brace which is a reasonable request to help manage her pain.   Past Medical History  Diagnosis Date  . Spinal stenosis   . Hypertension   . Pulmonary embolism ([place]) 04/2001; 09/19/2014    "after gallbladder OR; "  . High cholesterol   . Myocardial infarction ([place]) 10/2010 X 3    "while hospitalized"  . Pneumonia 04/2014  . Sleep apnea     "mild" (09/19/2014)  . Type II diabetes mellitus ([place])     "dx'd in 2007; lost weight; no RX for ~ 4 yr now" (09/19/2014)  . Anemia   . History of blood transfusion 1985    related to hysterectomy  . Esophageal spasm   . Gastritis   . Gastroenteritis   . IBS (irritable bowel syndrome)   . GERD (gastroesophageal reflux disease)   . Headache     "couple /month lately" (09/19/2014)  . Migraine     "a few times/yr" (09/19/2014)  . Osteoarthritis     "qwhere; mostly around my joints" (09/19/2014)  . Spondylosis   . Chronic back pain     "upper and lower" (09/19/2014)  . Depression     "major" (09/19/2014)  . PTSD (post-traumatic stress disorder)   . Schizoaffective  disorder ([place])   . Snoring     a. sleep study 5/16: No OSA    Patient Active Problem List   Diagnosis Date Noted  . History of DVT (deep vein thrombosis) 02/25/2015  . 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 Constipation 12/12/2014  . Coronary vasospasm ([place]) 12/09/2014  . History of pulmonary embolus (PE) 09/19/2014  . Impingement syndrome of shoulder 08/30/2014  . Injury of superior glenoid labrum of shoulder joint 08/16/2014  . Supraspinatus tenosynovitis 08/16/2014  . Cervical spondylosis without myelopathy 08/16/2014  . Anxiety and depression 04/13/2014  . Osteoarthritis of left hip 04/11/2014   . Status post left hip replacement 04/11/2014  . IBS (irritable bowel syndrome) 04/11/2014  . GERD (gastroesophageal reflux disease) 04/11/2014  . Unspecified constipation 04/11/2014  . Insomnia 04/11/2014  . Essential hypertension, benign 10/28/2013  . Left hip pain 10/28/2013  . Anemia 10/28/2013  . HLD (hyperlipidemia) 10/28/2013    Social History  Substance Use Topics  . Smoking status: Former Smoker -- 1.50 packs/day for 10 years    Types: Cigarettes    Quit date: 04/12/1979  . Smokeless tobacco: Never Used  . Alcohol Use: Yes     Comment: 09/19/2014 "nothing in the last 6 months"     Current outpatient prescriptions:  .  apixaban (ELIQUIS) 5 MG TABS tablet, Take 1 tablet (5 mg total) by mouth 2 (two) times daily., Disp: 60 tablet, Rfl: 3 .  BIOTIN PO, Take 1 tablet by mouth daily., Disp: , Rfl:  .  carvedilol (COREG) 3.125 MG tablet, Take 1 tablet (3.125 mg total) by mouth 2 (two) times daily with a meal., Disp: 180 tablet, Rfl: 3 .  cyclobenzaprine (FLEXERIL) 10 MG tablet, Take 1 tablet (10 mg total) by mouth every 8 (eight) hours., Disp: 40 tablet, Rfl: 1 .  dicyclomine (BENTYL) 10 MG capsule, TAKE 2 CAPSULES BY MOUTH EVERY SIX HOURS, Disp: 240 capsule, Rfl: 5 .  diltiazem (CARDIZEM CD) 120 MG 24 hr capsule, TAKE ONE CAPSULE BY MOUTH EVERY DAY, Disp: 30 capsule, Rfl: 0 .  diphenhydrAMINE (BENADRYL) 25 MG tablet, Take 25 mg by mouth every 8 (eight) hours as needed for allergies. , Disp: , Rfl:  .  escitalopram (LEXAPRO) 10 MG tablet, TAKE 1 TABLET BY MOUTH EVERY DAY, Disp: 30 tablet, Rfl: 5 .  furosemide (LASIX) 20 MG tablet, Take 20 mg by mouth daily as needed for fluid or edema., Disp: , Rfl:  .  gabapentin (NEURONTIN) 600 MG tablet, Take 1 tablet (600 mg total) by mouth every 8 (eight) hours., Disp: 90 tablet, Rfl: 5 .  isosorbide mononitrate (IMDUR) 30 MG 24 hr tablet, Take 1 tablet (30 mg total) by mouth daily., Disp: 30 tablet, Rfl: 11 .  Multiple Vitamin (MULTIVITAMIN  WITH MINERALS) TABS tablet, Take 2 tablets by mouth daily. , Disp: , Rfl:  .  NIFEdipine (PROCARDIA-XL/ADALAT-CC/NIFEDICAL-XL) 30 MG 24 hr tablet, Take by mouth., Disp: , Rfl:  .  nitroGLYCERIN (NITROSTAT) 0.4 MG SL tablet, Place 1 tablet (0.4 mg total) under the tongue every 5 (five) minutes as needed for chest pain. Take 1 NTG, under your tongue, while sitting. If no relief of pain may repeat NTG, one tab every 5 minutes up to 3 tablets total over 15 minutes., Disp: 25 tablet, Rfl: 3 .  ondansetron (ZOFRAN) 4 MG tablet, Take 1 tablet (4 mg total) by mouth every 6 (six) hours as needed for nausea or vomiting., Disp: 12 tablet, Rfl: 0 .  oxyCODONE-acetaminophen (PERCOCET) 10-325 MG per tablet,  Take 1 tablet by mouth every 6 (six) hours as needed for pain., Disp: 120 tablet, Rfl: 0 .  pantoprazole (PROTONIX) 40 MG tablet, TAKE 1 TABLET BY MOUTH EVERY DAY, Disp: 30 tablet, Rfl: 5 .  traZODone (DESYREL) 50 MG tablet, Take 1 tablet (50 mg total) by mouth at bedtime., Disp: 30 tablet, Rfl: 5  Past Surgical History  Procedure Laterality Date  . Excision/release bursa hip Right 12/1984  . Total hip arthroplasty Left 04-06-2014  . Tonsillectomy and adenoidectomy  ~ 1968  . Appendectomy  1985  . Abdominal hysterectomy  1985  . Cholecystectomy  2002  . Hernia repair  1985  . Umbilical hernia repair  1985  . Vena cava filter placement  03/2014  . Back surgery    . Anterior cervical decomp/discectomy fusion  10/2012  . Breast cyst excision Right   . Cardiac catheterization   2000's; 2009  . Left heart catheterization with coronary angiogram N/A 12/09/2014    Procedure: LEFT HEART CATHETERIZATION WITH CORONARY ANGIOGRAM;  Surgeon: Burnell Blanks, MD;  Location[facility] CATH LAB;  Service: Cardiovascular;  Laterality: N/A;    Family History  Problem Relation Age of Onset  . Heart attack Neg Hx   . Stroke Mother     Allergies  Allergen Reactions  . Abilify [Aripiprazole] Other (See Comments)     Jerking movement, slow motor skills  . Augmentin [Amoxicillin-Pot Clavulanate] Diarrhea and Nausea And Vomiting  . Bactrim [Sulfamethoxazole-Trimethoprim] Diarrhea  . Ceftin [Cefuroxime Axetil] Diarrhea  . Diclofenac Other (See Comments)     Muscle Pain  . Indocin [Indomethacin] Other (See Comments)    Migraines   . Linaclotide Diarrhea  . Lisinopril Diarrhea and Other (See Comments)    Other reaction(s): Dizziness, Vomiting  . Morphine Other (See Comments)    Chest Tightness  . Morphine And Related Other (See Comments)    Chest tightness   . Simvastatin Other (See Comments)    Muscle Pain  . Sulfa Antibiotics Diarrhea  . Sulfamethoxazole-Trimethoprim Diarrhea  . Wellbutrin [Bupropion] Other (See Comments)    Jerking movement Slurred speech  . Quetiapine Fumarate Palpitations     Review of Systems  CONSTITUTIONAL: No significant weight changes, fever, chills, weakness or fatigue.  CARDIOVASCULAR: Chronic intermittent chest pain. No chest pressure or chest discomfort. No palpitations or edema.  RESPIRATORY: No shortness of breath, cough or sputum.  NEUROLOGICAL: No headache, dizziness, syncope, paralysis, ataxia, numbness or tingling in the extremities. No memory changes. No change in bowel or bladder control.  MUSCULOSKELETAL: Chronic joint pain. No muscle pain. HEMATOLOGIC: No anemia, bleeding or bruising.  LYMPHATICS: No enlarged lymph nodes.  PSYCHIATRIC: No change in mood. No change in sleep pattern.  ENDOCRINOLOGIC: No reports of sweating, cold or heat intolerance. No polyuria or polydipsia.     Objective  BP 130/72 mmHg  Pulse 100  Temp(Src) 98.7 F (37.1 C) (Oral)  Resp 16  Wt 202 lb 12.8 oz (91.989 kg)  SpO2 97% Body mass index is 32.75 kg/(m^2).  Physical Exam  Constitutional: Patient is obese and well-nourished. In no distress.  Cardiovascular: Normal rate, regular rhythm and normal heart sounds.  No murmur heard.  Pulmonary/Chest: Effort normal and  breath sounds normal. No respiratory distress. Musculoskeletal: Normal range of motion bilateral UE and LE, no joint effusions. No palpable step of of Lumbar spine with normal truncal movements.  Peripheral vascular: Bilateral LE no edema. Neurological: CN II-XII grossly intact with no focal deficits. Alert and oriented to person,  place, and time. Coordination, balance, strength, speech and gait are normal.  Skin: Skin is warm and dry. No rash noted. No erythema.  Psychiatric: Patient has a stable mood and affect. Behavior is normal in office today. Judgment and thought content normal in office today.   Recent Results (from the past 2160 hour(s))  CBC with Differential     Status: Abnormal   Collection Time: 03/22/15 12:48 PM  Result Value Ref Range   WBC 6.0 4.0 - 10.5 K/uL   RBC 3.75 (L) 3.87 - 5.11 MIL/uL   Hemoglobin 10.7 (L) 12.0 - 15.0 g/dL   HCT 33.1 (L) 36.0 - 46.0 %   MCV 88.3 78.0 - 100.0 fL   MCH 28.5 26.0 - 34.0 pg   MCHC 32.3 30.0 - 36.0 g/dL   RDW 14.9 11.5 - 15.5 %   Platelets 379 150 - 400 K/uL   Neutrophils Relative % 47 43 - 77 %   Neutro Abs 2.8 1.7 - 7.7 K/uL   Lymphocytes Relative 42 12 - 46 %   Lymphs Abs 2.5 0.7 - 4.0 K/uL   Monocytes Relative 6 3 - 12 %   Monocytes Absolute 0.4 0.1 - 1.0 K/uL   Eosinophils Relative 5 0 - 5 %   Eosinophils Absolute 0.3 0.0 - 0.7 K/uL   Basophils Relative 0 0 - 1 %   Basophils Absolute 0.0 0.0 - 0.1 K/uL  Basic metabolic panel     Status: Abnormal   Collection Time: 03/22/15 12:48 PM  Result Value Ref Range   Sodium 138 135 - 145 mmol/L   Potassium 4.0 3.5 - 5.1 mmol/L   Chloride 103 101 - 111 mmol/L   CO2 28 22 - 32 mmol/L   Glucose, Bld 100 (H) 65 - 99 mg/dL   BUN 6 6 - 20 mg/dL   Creatinine, Ser 0.82 0.44 - 1.00 mg/dL   Calcium 9.1 8.9 - 10.3 mg/dL   GFR calc non Af Amer >60 >60 mL/min   GFR calc Af Amer >60 >60 mL/min    Comment: (NOTE) The eGFR has been calculated using the CKD EPI equation. This calculation  has not been validated in all clinical situations. eGFR's persistently <60 mL/min signify possible Chronic Kidney Disease.    Anion gap 7 5 - 15  I-Stat Troponin, ED (not at [facility])     Status: None   Collection Time: 03/22/15  1:17 PM  Result Value Ref Range   Troponin i, poc 0.00 0.00 - 0.08 ng/mL   Comment 3            Comment: Due to the release kinetics of cTnI, a negative result within the first hours of the onset of symptoms does not rule out myocardial infarction with certainty. If myocardial infarction is still suspected, repeat the test at appropriate intervals.   I-Stat Troponin, ED (not at [facility])     Status: None   Collection Time: 03/22/15  3:14 PM  Result Value Ref Range   Troponin i, poc 0.00 0.00 - 0.08 ng/mL   Comment 3            Comment: Due to the release kinetics of cTnI, a negative result within the first hours of the onset of symptoms does not rule out myocardial infarction with certainty. If myocardial infarction is still suspected, repeat the test at appropriate intervals.   I-stat troponin, ED     Status: None   Collection Time: 05/24/15  8:58 PM  Result Value  Ref Range   Troponin i, poc 0.00 0.00 - 0.08 ng/mL   Comment 3            Comment: Due to the release kinetics of cTnI, a negative result within the first hours of the onset of symptoms does not rule out myocardial infarction with certainty. If myocardial infarction is still suspected, repeat the test at appropriate intervals.   Basic metabolic panel     Status: None   Collection Time: 05/24/15  9:05 PM  Result Value Ref Range   Sodium 140 135 - 145 mmol/L   Potassium 3.8 3.5 - 5.1 mmol/L   Chloride 108 101 - 111 mmol/L   CO2 26 22 - 32 mmol/L   Glucose, Bld 94 65 - 99 mg/dL   BUN 8 6 - 20 mg/dL   Creatinine, Ser 0.81 0.44 - 1.00 mg/dL   Calcium 9.1 8.9 - 10.3 mg/dL   GFR calc non Af Amer >60 >60 mL/min   GFR calc Af Amer >60 >60 mL/min    Comment: (NOTE) The eGFR has been calculated  using the CKD EPI equation. This calculation has not been validated in all clinical situations. eGFR's persistently <60 mL/min signify possible Chronic Kidney Disease.    Anion gap 6 5 - 15  CBC     Status: Abnormal   Collection Time: 05/24/15  9:05 PM  Result Value Ref Range   WBC 6.0 4.0 - 10.5 K/uL   RBC 3.51 (L) 3.87 - 5.11 MIL/uL   Hemoglobin 10.3 (L) 12.0 - 15.0 g/dL   HCT 32.0 (L) 36.0 - 46.0 %   MCV 91.2 78.0 - 100.0 fL   MCH 29.3 26.0 - 34.0 pg   MCHC 32.2 30.0 - 36.0 g/dL   RDW 14.7 11.5 - 15.5 %   Platelets 306 150 - 400 K/uL  Protime-INR - (order if Patient is taking Coumadin / Warfarin)     Status: None   Collection Time: 05/24/15  9:05 PM  Result Value Ref Range   Prothrombin Time 14.6 11.6 - 15.2 seconds   INR 1.12 0.00 - 1.49  I-stat troponin, ED     Status: None   Collection Time: 05/25/15  3:56 AM  Result Value Ref Range   Troponin i, poc 0.00 0.00 - 0.08 ng/mL   Comment 3            Comment: Due to the release kinetics of cTnI, a negative result within the first hours of the onset of symptoms does not rule out myocardial infarction with certainty. If myocardial infarction is still suspected, repeat the test at appropriate intervals.      Assessment & Plan  1. Gastroesophageal reflux disease without esophagitis Continue PPI. Has upcoming appointment with GI specialist. I encouraged Hortense to discuss her esophageal spasms along with chronic GI issues with her specialist. She may benefit from EGD to rule out esophagitis, structure, hiatal hernia. I have reviewed CT scans and X-rays for chest and abdomen from the past 1-2 years with no mention of hiatal hernia.   2. Esophageal spasm Continue CCB.  3. Chronic pain of multiple sites Refilled pain medication for 1 month supply. The patient has been prescribed a controled substance today under the agreement of a filed pain treatment regimen contract.  With use of this medication they verbalize understanding  the potential risk of addiction, abuse, and misuse, which can lead to overdose and death. The patient may not obtain and use other illicit or controled substances  from any other sources while under the aformentioned contract. A urine drug screen will be performed periodically and the patient's name will be reviewed on the [place] Controlled Substance Reporting System regularly.  The patient expresses understanding the above statement and agreement to comply.  The patient has been counseled on the proper use, side effects and potential interactions of the new medication with other prescribed and OTC medications. Under no circumstances is this (and any other) medication to be use with alcohol or other illicit drugs. This medication is not to be crushed, chewed, sniffed, injected or used in any other way other than what is stated in the directions. This medication is to be used at the frequency and quantity that is stated in the directions. This medication is to be used only by the individual who's name is on the prescription bottle. Drug sharing and selling is unacceptable. Patient voices understanding what has been said today.  - HYDROcodone-acetaminophen (NORCO) 10-325 MG tablet; Take 1 tablet by mouth every 6 (six) hours as needed.  Dispense: 120 tablet; Refill: 0  4. Chronic lumbosacral pain Back brace to be ordered through external entity.   - DG Lumbar Spine Complete; Future

## 2015-06-18 ENCOUNTER — Ambulatory Visit (INDEPENDENT_AMBULATORY_CARE_PROVIDER_SITE_OTHER)
Admission: RE | Admit: 2015-06-18 | Discharge: 2015-06-18 | Disposition: A | Discharge status: Home / Self Care | Payer: Medicare Other | Source: Ambulatory Visit | Attending: Physician Assistant | Admitting: Physician Assistant

## 2015-06-18 ENCOUNTER — Other Ambulatory Visit (HOSPITAL_COMMUNITY): Payer: Self-pay | Admitting: Physician Assistant

## 2015-06-18 ENCOUNTER — Ambulatory Visit (INDEPENDENT_AMBULATORY_CARE_PROVIDER_SITE_OTHER): Payer: Medicare Other | Admitting: Physician Assistant

## 2015-06-18 ENCOUNTER — Encounter: Payer: Self-pay | Admitting: Physician Assistant

## 2015-06-18 VITALS — BP 130/78 | HR 80 | Ht 66.0 in | Wt 203.0 lb

## 2015-06-18 DIAGNOSIS — R0789 Other chest pain: Secondary | ICD-10-CM

## 2015-06-18 DIAGNOSIS — R1310 Dysphagia, unspecified: Secondary | ICD-10-CM | POA: Diagnosis not present

## 2015-06-18 DIAGNOSIS — K5900 Constipation, unspecified: Secondary | ICD-10-CM | POA: Diagnosis not present

## 2015-06-18 DIAGNOSIS — K5909 Other constipation: Secondary | ICD-10-CM

## 2015-06-18 DIAGNOSIS — K219 Gastro-esophageal reflux disease without esophagitis: Secondary | ICD-10-CM | POA: Diagnosis not present

## 2015-06-18 DIAGNOSIS — K224 Dyskinesia of esophagus: Secondary | ICD-10-CM

## 2015-06-18 NOTE — Patient Instructions (Signed)
Your physician has requested that you go to the basement for 2 x rays.    You have been scheduled for a Modified Barium Esophogram at [facility] Radiology (1st floor of the hospital) on 07/04/2015 at 1:00pm. Please arrive 15 minutes prior to your appointment for registration. If you need to reschedule for any reason, please contact radiology at [phone] to do so. __________________________________________________________________   Continue taking your Protonix    We will call you regarding a follow up with Dr. Havery Moros

## 2015-06-18 NOTE — Progress Notes (Addendum)
Patient ID: Valerie Vazquez, female   DOB: March 02, 1956, 59 y.o.   MRN: [id number]    HPI:  Valerie Vazquez is a 59 y.o.   female referred by Valerie Lime, MD  to establish care with a new gastroenterologist. Valerie Vazquez states that she relocated to [place] from [place] in 2015. Earlier this year, she relocated to [place], [place]. She has a rather extensive past medical history including hypertension, multiple pulmonary embolisms, DVT, hyperlipidemia, MI 3, diabetes, GERD , spinal stenosis, sleep apnea, esophageal spasm, gastritis, IBS , headaches, migraines, osteoarthritis, spondylosis, chronic back pain, major depression, PTSD ,  Schizoaffective disorder, coronary  artery spasm, impingement syndrome of the shoulder, injury to the superior  glenoid labrum of the shoulder, supraspinatus tenosynovitis , and  is status post left total hip replacement.  She has an IVC filter and is on eliquis.   She states that this past April she was admitted with chest pain and had a cardiac cath that was normal she was told she had arterial spasm and was started on indoor and Coreg. She reports that she has been on Cardizem for esophageal spasm for approximately 10 years. She states she was diagnosed with esophageal spasm at the [facility]. She states she has a long-standing history of GERD , but due to chest pain and dysphagia she had a manometry which was abnormal. She reports that she had a pH test but she thinks was normal. Over the past 9 or 10 years she has frequent dysphagia to solids and liquids. She chokes very easily. Over the past several months she feels as if food and liquids pool in the back of her throat and she coughs and sputters and feels like she is choking. She has been belching a lot. She has no nausea or vomiting. She last had an upper endoscopy at the [facility] 10-12 years ago , and she says she had gastritis. She does not remember if she  had H. Pylori.  She reports that she had an anterior cervical disc infusion in 2015 , and feels that her dysphagia has been worse since that time. She states when she turns her head to the left she often hears a pop. She has not been able to follow-up with her surgeon since her surgery.   She also states that for most of her adult life her stools alternate between days of loose stools and days of constipation. In November 2014 she states she was started on lisinopril and began to have diarrhea. Her lisinopril was discontinued and her diarrhea got better. She now states that for months she has been constipated. She tried linzess  but states it caused diarrhea so she stopped it. She also tried Amitiza. She took it for 2 days and it didn't work so she stopped it. Her last bowel movement was 3 days ago. She reports that she typically passes hard nuggets that she has to strain to pass. She reports that she often begins to have right-sided abdominal pain after she has skip several days without a bowel movement. She reports that she had a colonoscopy at [facility] in [place] several years ago. She does not know the findings. She also reports that she had a workup for abdominal pain and erratic bowel movements at [facility] in [place] 10-15 years ago and she says she was told she had a megacolon.   Past Medical History  Diagnosis Date  . Spinal stenosis   .  Hypertension   . Pulmonary embolism ([place]) 04/2001; 09/19/2014    "after gallbladder OR; "  . High cholesterol   . Myocardial infarction ([place]) 10/2010 X 3    "while hospitalized"  . Pneumonia 04/2014  . Sleep apnea     "mild" (09/19/2014)  . Type II diabetes mellitus ([place])     "dx'd in 2007; lost weight; no RX for ~ 4 yr now" (09/19/2014)  . Anemia   . History of blood transfusion 1985    related to hysterectomy  . Esophageal spasm   . Gastritis   . Gastroenteritis   . IBS (irritable bowel syndrome)   . GERD  (gastroesophageal reflux disease)   . Headache     "couple /month lately" (09/19/2014)  . Migraine     "a few times/yr" (09/19/2014)  . Osteoarthritis     "qwhere; mostly around my joints" (09/19/2014)  . Spondylosis   . Chronic back pain     "upper and lower" (09/19/2014)  . Depression     "major" (09/19/2014)  . PTSD (post-traumatic stress disorder)   . Schizoaffective disorder ([place])   . Snoring     a. sleep study 5/16: No OSA    Past Surgical History  Procedure Laterality Date  . Excision/release bursa hip Right 12/1984  . Total hip arthroplasty Left 04-06-2014  . Tonsillectomy and adenoidectomy  ~ 1968  . Appendectomy  1985  . Abdominal hysterectomy  1985  . Cholecystectomy  2002  . Hernia repair  1985  . Umbilical hernia repair  1985  . Vena cava filter placement  03/2014  . Back surgery    . Anterior cervical decomp/discectomy fusion  10/2012  . Breast cyst excision Right   . Cardiac catheterization   2000's; 2009  . Left heart catheterization with coronary angiogram N/A 12/09/2014    Procedure: LEFT HEART CATHETERIZATION WITH CORONARY ANGIOGRAM;  Surgeon: Burnell Blanks, MD;  Location[facility] C[facility] LAB;  Service: Cardiovascular;  Laterality: N/A;   Family History  Problem Relation Age of Onset  . Heart attack Neg Hx   . Stroke Mother    Social History  Substance Use Topics  . Smoking status: Former Smoker -- 1.50 packs/day for 10 years    Types: Cigarettes    Quit date: 04/12/1979  . Smokeless tobacco: Never Used  . Alcohol Use: 0.0 oz/week    0 Standard drinks or equivalent per week     Comment: 09/19/2014 "nothing in the last 6 months"   Current Outpatient Prescriptions  Medication Sig Dispense Refill  . apixaban (ELIQUIS) 5 MG TABS tablet Take 1 tablet (5 mg total) by mouth 2 (two) times daily. 60 tablet 3  . BIOTIN PO Take 1 tablet by mouth daily.    . carvedilol (COREG) 3.125 MG tablet Take 1 tablet (3.125 mg total) by mouth 2 (two) times daily with a meal.  180 tablet 3  . cyclobenzaprine (FLEXERIL) 10 MG tablet Take 1 tablet (10 mg total) by mouth every 8 (eight) hours. 40 tablet 1  . dicyclomine (BENTYL) 10 MG capsule TAKE 2 CAPSULES BY MOUTH EVERY SIX HOURS 240 capsule 5  . diltiazem (CARDIZEM CD) 120 MG 24 hr capsule TAKE ONE CAPSULE BY MOUTH EVERY DAY 30 capsule 0  . diphenhydrAMINE (BENADRYL) 25 MG tablet Take 25 mg by mouth every 8 (eight) hours as needed for allergies.     Marland Kitchen escitalopram (LEXAPRO) 10 MG tablet TAKE 1 TABLET BY MOUTH EVERY DAY 30 tablet 5  . furosemide (  LASIX) 20 MG tablet Take 20 mg by mouth daily as needed for fluid or edema.    . gabapentin (NEURONTIN) 600 MG tablet Take 1 tablet (600 mg total) by mouth every 8 (eight) hours. 90 tablet 5  . HYDROcodone-acetaminophen (NORCO) 10-325 MG tablet Take 1 tablet by mouth every 6 (six) hours as needed. 120 tablet 0  . isosorbide mononitrate (IMDUR) 30 MG 24 hr tablet Take 1 tablet (30 mg total) by mouth daily. 30 tablet 11  . Multiple Vitamin (MULTIVITAMIN WITH MINERALS) TABS tablet Take 2 tablets by mouth daily.     Marland Kitchen NIFEdipine (PROCARDIA-XL/ADALAT-CC/NIFEDICAL-XL) 30 MG 24 hr tablet Take by mouth.    . nitroGLYCERIN (NITROSTAT) 0.4 MG SL tablet Place 1 tablet (0.4 mg total) under the tongue every 5 (five) minutes as needed for chest pain. Take 1 NTG, under your tongue, while sitting. If no relief of pain may repeat NTG, one tab every 5 minutes up to 3 tablets total over 15 minutes. 25 tablet 3  . ondansetron (ZOFRAN) 4 MG tablet Take 1 tablet (4 mg total) by mouth every 8 (eight) hours as needed for nausea or vomiting. 30 tablet 3  . pantoprazole (PROTONIX) 40 MG tablet TAKE 1 TABLET BY MOUTH EVERY DAY 30 tablet 5  . traZODone (DESYREL) 50 MG tablet Take 1 tablet (50 mg total) by mouth at bedtime. 30 tablet 5   No current facility-administered medications for this visit.   Allergies  Allergen Reactions  . Abilify [Aripiprazole] Other (See Comments)    Jerking movement, slow  motor skills  . Augmentin [Amoxicillin-Pot Clavulanate] Diarrhea and Nausea And Vomiting  . Bactrim [Sulfamethoxazole-Trimethoprim] Diarrhea  . Ceftin [Cefuroxime Axetil] Diarrhea  . Diclofenac Other (See Comments)     Muscle Pain  . Indocin [Indomethacin] Other (See Comments)    Migraines   . Linaclotide Diarrhea  . Lisinopril Diarrhea and Other (See Comments)    Other reaction(s): Dizziness, Vomiting  . Morphine Other (See Comments)    Chest Tightness  . Morphine And Related Other (See Comments)    Chest tightness   . Simvastatin Other (See Comments)    Muscle Pain  . Sulfa Antibiotics Diarrhea  . Sulfamethoxazole-Trimethoprim Diarrhea  . Wellbutrin [Bupropion] Other (See Comments)    Jerking movement Slurred speech  . Quetiapine Fumarate Palpitations     Review of Systems: Gen: Denies any fever, chills, sweats, anorexia, fatigue, weakness, malaise, weight loss, and sleep disorder CV:  Has had several episodes of chest pain this year for which she has been seen in the emergency room and diagnosed with coronary artery spasm. Resp: Denies dyspnea at rest, dyspnea with exercise, cough, sputum, wheezing, coughing up blood, and pleurisy. GI: Denies vomiting blood, jaundice, and fecal incontinence.    Has dysphagia to solids and liquids. GU : Denies urinary burning, blood in urine, urinary frequency, urinary hesitancy, nocturnal urination, and urinary incontinence. MS:  Has arthritis and states all of her joints hurt at some point or the other Derm: Denies rash, itching, dry skin, hives, moles, warts, or unhealing ulcers.  Psych: Denies depression, anxiety, memory loss, suicidal ideation, hallucinations, paranoia, and confusion. Heme: Denies bruising, bleeding, and enlarged lymph nodes. Neuro:  Denies any headaches, dizziness, paresthesias. Endo:  Denies any problems with  thyroid, adrenal function   Physical Exam: BP 130/78 mmHg  Pulse 80  Ht 5\' 6"  (1.676 m)  Wt 203 lb (92.08  kg)  BMI 32.78 kg/m2 Constitutional: Pleasant, obese, African-American female in no acute distress. HEENT:  Normocephalic and atraumatic. Conjunctivae are normal. No scleral icterus. Neck supple.  No JVD Cardiovascular: Normal rate, regular rhythm.  Pulmonary/chest: Effort normal and breath sounds normal. No wheezing, rales or rhonchi. Abdominal: Soft, nondistended, nontender. Bowel sounds active throughout. There are no masses palpable. No hepatomegaly. Extremities: no edema Lymphadenopathy: No cervical adenopathy noted. Neurological: Alert and oriented to person place and time. Skin: Skin is warm and dry. No rashes noted. Psychiatric: Normal mood and affect. Behavior is normal.  ASSESSMENT AND PLAN:  #1. Dysphagia, GERD, noncardiac chest pain. These may all be related to some poorly controlled reflux. An antireflux regimen has been reviewed. She will continue her Protonix 40 mg by mouth every morning. She reports a history of esophageal spasm for which she has been on Cardizem which she feels is no longer working. She is now complaining of the sensation of food and liquids pooling in her throat with coughing and sputtering with meals. A modified barium swallow  With speech pathology will be obtained. She has also signed a medical release forms to obtain copies of her EGD , 24-hour pH testing, and manometry from the [facility]. Since she reports that her dysphagia seems to be worse since her anterior cervical disc fusion, a lateral neck film will be obtained to see if there is any spurring at the graft sites that may be causing an impingement.    #2. Constipation. Patient will be sent 4 a 2 view abdominal film to evaluate for stool burden. If she is noted to have a large stool burden, she will be given a Mira lax and Gatorade purge , and then will be given a trial of Senokot daily at bedtime. She has been encouraged to try to increase water and fiber in her diet, but she states it is  often too difficult for her to swallow these. She has signed a medical release forms to obtain prior colonoscopy report from [facility] in [place] as well as [facility] in [place].    She will return in December at which time she would like to establish care with Dr. Havery Moros. Further recommendations will be made pending the findings of the above testing and review of medical records from the above mentioned and health care facilities.    Rett Stehlik, Vita Barley PA-C 06/18/2015, 9:26 PM  CC: Valerie Lime, MD  Addendum 06/22/2015. Colonoscopy report received from [facility]-- Colonoscopy performed 02/21/2013 entire examined colon was normal. Internal hemorrhoids noted. Patient was advised to use fiber and Preparation H.   addendum 06/28/2015. Received notes from the [facility]. Notes report patient has a history of constipation predominant IBS which was managed with Bentyl and over-the-counter probiotics. She had also been advised to use Colace twice daily. Colonoscopy in 2008 and 2011 were unremarkable.    records also reports a history of diffuse esophageal spasm diagnosed by esophageal manometry. She had responded well to diltiazem 60 mg daily and omeprazole 40 mg twice daily. She had significantly less dysphagia when on the diltiazem. She had a -24 hour pH impedance off PPI and esophageal manometry impedance with DES in November 2010. She had 2 normal colonoscopies in 2011 and a normal EGD in 2011. In November 2012 her diltiazem was switched to 60 mg every 6 hours.   Addendum 07/24/2015: Received notes from [facility] in [place].  Colonoscopy 05/19/2003 performed for rectal bleeding. Postoperative diagnosis was internal hemorrhoids, otherwise normal examination to the terminal  ileum. Biopsies showed unremarkable colonic mucosa. No evidence of microscopic colitis.  04/15/2001: Laparoscopic  cholecystectomy  05/29/2000: Colonoscopy to the cecum performed due to complaints of rectal bleeding, anemia, and abdominal pain. Postoperative diagnosis internal hemorrhoids. Rectal erythema, questionable proctitis. Poor prep of the transverse and right colon. Air contrast barium enema was ordered. 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  05/27/2000: EGD, Preop diagnosis was abdominal discomfort, anemia, heme-positive stools. Postoperative diagnosis normal-appearing esophagus. Normal-appearing stomach. Normal duodenum, status post small bowel biopsies to rule out sprue. Small bowel mucosa without diagnostic abnormality. Villi and plasma cells are present. There is no evidence of Whipple's disease, celiac sprue, or Giardia.

## 2015-06-19 NOTE — Progress Notes (Signed)
Agree with assessment and plan, will await old records to clarify her prior workup.

## 2015-06-21 ENCOUNTER — Other Ambulatory Visit: Payer: Self-pay | Admitting: Family Medicine

## 2015-06-21 DIAGNOSIS — M2550 Pain in unspecified joint: Principal | ICD-10-CM

## 2015-06-21 DIAGNOSIS — G8929 Other chronic pain: Secondary | ICD-10-CM

## 2015-06-21 NOTE — Telephone Encounter (Signed)
Patient is questioning the directions on Ondansetron. It says take 1 every 8 hours PRN and was given 30 pills, patient states that she normally take this medication up to 3 times daily. I informed her that she does have refills but she was questioning would it be to soon. Also, requesting refill on Cyclobenzaprine.

## 2015-06-21 NOTE — Telephone Encounter (Signed)
Refill request was sent to Dr. Ashany Sundaram for approval and submission.  

## 2015-06-22 MED ORDER — CYCLOBENZAPRINE HCL 10 MG PO TABS: 10.0000 mg | ORAL_TABLET | Freq: Three times a day (TID) | ORAL | Status: DC

## 2015-06-29 ENCOUNTER — Other Ambulatory Visit: Payer: Self-pay | Admitting: Family Medicine

## 2015-06-29 DIAGNOSIS — M47812 Spondylosis without myelopathy or radiculopathy, cervical region: Secondary | ICD-10-CM

## 2015-06-29 DIAGNOSIS — M545 Low back pain, unspecified: Secondary | ICD-10-CM

## 2015-06-29 DIAGNOSIS — G8929 Other chronic pain: Secondary | ICD-10-CM

## 2015-06-29 MED ORDER — OXYCODONE-ACETAMINOPHEN 10-325 MG PO TABS: 1.0000 | ORAL_TABLET | Freq: Four times a day (QID) | ORAL | Status: DC | PRN

## 2015-07-04 ENCOUNTER — Ambulatory Visit (HOSPITAL_COMMUNITY)
Admission: RE | Admit: 2015-07-04 | Discharge: 2015-07-04 | Disposition: A | Discharge status: Home / Self Care | Payer: Medicare Other | Source: Ambulatory Visit | Attending: Physician Assistant | Admitting: Physician Assistant

## 2015-07-04 ENCOUNTER — Ambulatory Visit (HOSPITAL_COMMUNITY): Payer: Medicare Other

## 2015-07-05 ENCOUNTER — Other Ambulatory Visit: Payer: Self-pay | Admitting: Cardiovascular Disease

## 2015-07-07 ENCOUNTER — Emergency Department (HOSPITAL_COMMUNITY): Payer: Medicare Other

## 2015-07-07 ENCOUNTER — Emergency Department (HOSPITAL_COMMUNITY)
Admission: EM | Admit: 2015-07-07 | Discharge: 2015-07-07 | Disposition: A | Discharge status: Home / Self Care | Payer: Medicare Other | Attending: Emergency Medicine | Admitting: Emergency Medicine

## 2015-07-07 ENCOUNTER — Encounter (HOSPITAL_COMMUNITY): Payer: Self-pay | Admitting: *Deleted

## 2015-07-07 DIAGNOSIS — K219 Gastro-esophageal reflux disease without esophagitis: Secondary | ICD-10-CM | POA: Diagnosis not present

## 2015-07-07 DIAGNOSIS — Y998 Other external cause status: Secondary | ICD-10-CM | POA: Insufficient documentation

## 2015-07-07 DIAGNOSIS — M1990 Unspecified osteoarthritis, unspecified site: Secondary | ICD-10-CM | POA: Diagnosis not present

## 2015-07-07 DIAGNOSIS — W01198A Fall on same level from slipping, tripping and stumbling with subsequent striking against other object, initial encounter: Secondary | ICD-10-CM | POA: Insufficient documentation

## 2015-07-07 DIAGNOSIS — Z862 Personal history of diseases of the blood and blood-forming organs and certain disorders involving the immune mechanism: Secondary | ICD-10-CM | POA: Insufficient documentation

## 2015-07-07 DIAGNOSIS — Z79899 Other long term (current) drug therapy: Secondary | ICD-10-CM | POA: Diagnosis not present

## 2015-07-07 DIAGNOSIS — S0083XA Contusion of other part of head, initial encounter: Secondary | ICD-10-CM | POA: Diagnosis not present

## 2015-07-07 DIAGNOSIS — W19XXXA Unspecified fall, initial encounter: Secondary | ICD-10-CM

## 2015-07-07 DIAGNOSIS — M79632 Pain in left forearm: Secondary | ICD-10-CM | POA: Diagnosis not present

## 2015-07-07 DIAGNOSIS — G43909 Migraine, unspecified, not intractable, without status migrainosus: Secondary | ICD-10-CM | POA: Insufficient documentation

## 2015-07-07 DIAGNOSIS — Z9889 Other specified postprocedural states: Secondary | ICD-10-CM | POA: Insufficient documentation

## 2015-07-07 DIAGNOSIS — I252 Old myocardial infarction: Secondary | ICD-10-CM | POA: Insufficient documentation

## 2015-07-07 DIAGNOSIS — F329 Major depressive disorder, single episode, unspecified: Secondary | ICD-10-CM | POA: Insufficient documentation

## 2015-07-07 DIAGNOSIS — Z87891 Personal history of nicotine dependence: Secondary | ICD-10-CM | POA: Diagnosis not present

## 2015-07-07 DIAGNOSIS — Y9289 Other specified places as the place of occurrence of the external cause: Secondary | ICD-10-CM | POA: Insufficient documentation

## 2015-07-07 DIAGNOSIS — Z7902 Long term (current) use of antithrombotics/antiplatelets: Secondary | ICD-10-CM | POA: Diagnosis not present

## 2015-07-07 DIAGNOSIS — Y9389 Activity, other specified: Secondary | ICD-10-CM | POA: Insufficient documentation

## 2015-07-07 DIAGNOSIS — G8929 Other chronic pain: Secondary | ICD-10-CM | POA: Diagnosis not present

## 2015-07-07 DIAGNOSIS — Z8701 Personal history of pneumonia (recurrent): Secondary | ICD-10-CM | POA: Insufficient documentation

## 2015-07-07 DIAGNOSIS — E119 Type 2 diabetes mellitus without complications: Secondary | ICD-10-CM | POA: Diagnosis not present

## 2015-07-07 DIAGNOSIS — Z86711 Personal history of pulmonary embolism: Secondary | ICD-10-CM | POA: Insufficient documentation

## 2015-07-07 DIAGNOSIS — S59912A Unspecified injury of left forearm, initial encounter: Secondary | ICD-10-CM | POA: Diagnosis not present

## 2015-07-07 DIAGNOSIS — S5012XA Contusion of left forearm, initial encounter: Secondary | ICD-10-CM

## 2015-07-07 DIAGNOSIS — S0990XA Unspecified injury of head, initial encounter: Secondary | ICD-10-CM | POA: Diagnosis not present

## 2015-07-07 DIAGNOSIS — S0993XA Unspecified injury of face, initial encounter: Secondary | ICD-10-CM | POA: Diagnosis not present

## 2015-07-07 DIAGNOSIS — S79912A Unspecified injury of left hip, initial encounter: Secondary | ICD-10-CM | POA: Insufficient documentation

## 2015-07-07 DIAGNOSIS — M25552 Pain in left hip: Secondary | ICD-10-CM

## 2015-07-07 NOTE — Discharge Instructions (Signed)
Please read and follow all provided instructions.  Your diagnoses today include:  1. Fall, initial encounter   2. Facial contusion, initial encounter   3. Forearm contusion, left, initial encounter   4. Hip pain, left     Tests performed today include:  CT of your head and face - no injury  X-ray of your arm and hip - no broken bones  Vital signs. See below for your results today.   Medications prescribed:   None  Take any prescribed medications only as directed.  Home care instructions:  Follow any educational materials contained in this packet.  BE VERY CAREFUL not to take multiple medicines containing Tylenol (also called acetaminophen). Doing so can lead to an overdose which can damage your liver and cause liver failure and possibly death.   Follow-up instructions: Please follow-up with your primary care provider in the next 3 days for further evaluation of your symptoms.   Return instructions:   Please return to the Emergency Department if you experience worsening symptoms.   Please return if you have any other emergent concerns.  Additional Information:  Your vital signs today were: BP 142/77 mmHg   Pulse 83   Temp(Src) 98.1 F (36.7 C) (Oral)   Resp 18   SpO2 99% If your blood pressure (BP) was elevated above 135/85 this visit, please have this repeated by your doctor within one month. --------------

## 2015-07-07 NOTE — ED Provider Notes (Signed)
CSN: [id number]     Arrival date & time 07/07/15  1228 History   First MD Initiated Contact with Patient 07/07/15 1242     Chief Complaint  Patient presents with  . Fall    (Consider location/radiation/quality/duration/timing/severity/associated sxs/prior Treatment) HPI Comments: Patient with history of anticoagulation on Eliquis due to previous PE, failed IVC filter -- presents with complaint of fall. Patient states that last night her glasses fell on the floor. She bent to pick them up and fell striking her face and head on her walker. She also fell on her left forearm and left hip, both of which are painful. She's been ambulatory. Left forearm is worse with palpation and movement of her left wrist. No headache, vomiting, vision change, weakness in arms or legs. She denies other medical complaints. Patient states she took pain medicine prior to arrival. The onset of this condition was acute. The course is constant. Aggravating factors: none. Alleviating factors: none.    Patient is a 59 y.o. female presenting with fall. The history is provided by the patient.  Fall Associated symptoms include arthralgias and myalgias. Pertinent negatives include no chest pain, fatigue, headaches, nausea, neck pain, numbness, vomiting or weakness.    Past Medical History  Diagnosis Date  . Spinal stenosis   . Hypertension   . Pulmonary embolism ([place]) 04/2001; 09/19/2014    "after gallbladder OR; "  . High cholesterol   . Myocardial infarction ([place]) 10/2010 X 3    "while hospitalized"  . Pneumonia 04/2014  . Sleep apnea     "mild" (09/19/2014)  . Type II diabetes mellitus ([place])     "dx'd in 2007; lost weight; no RX for ~ 4 yr now" (09/19/2014)  . Anemia   . History of blood transfusion 1985    related to hysterectomy  . Esophageal spasm   . Gastritis   . Gastroenteritis   . IBS (irritable bowel syndrome)   . GERD (gastroesophageal reflux disease)   . Headache     "couple /month lately" (09/19/2014)   . Migraine     "a few times/yr" (09/19/2014)  . Osteoarthritis     "qwhere; mostly around my joints" (09/19/2014)  . Spondylosis   . Chronic back pain     "upper and lower" (09/19/2014)  . Depression     "major" (09/19/2014)  . PTSD (post-traumatic stress disorder)   . Schizoaffective disorder ([place])   . Snoring     a. sleep study 5/16: No OSA   Past Surgical History  Procedure Laterality Date  . Excision/release bursa hip Right 12/1984  . Total hip arthroplasty Left 04-06-2014  . Tonsillectomy and adenoidectomy  ~ 1968  . Appendectomy  1985  . Abdominal hysterectomy  1985  . Cholecystectomy  2002  . Hernia repair  1985  . Umbilical hernia repair  1985  . Vena cava filter placement  03/2014  . Back surgery    . Anterior cervical decomp/discectomy fusion  10/2012  . Breast cyst excision Right   . Cardiac catheterization   2000's; 2009  . Left heart catheterization with coronary angiogram N/A 12/09/2014    Procedure: LEFT HEART CATHETERIZATION WITH CORONARY ANGIOGRAM;  Surgeon: Burnell Blanks, MD;  Location: [facility] CATH LAB;  Service: Cardiovascular;  Laterality: N/A;   Family History  Problem Relation Age of Onset  . Heart attack Neg Hx   . Stroke Mother    Social History  Substance Use Topics  . Smoking status: Former Smoker -- 1.50 packs/day for  10 years    Types: Cigarettes    Quit date: 04/12/1979  . Smokeless tobacco: Never Used  . Alcohol Use: 0.0 oz/week    0 Standard drinks or equivalent per week     Comment: 09/19/2014 "nothing in the last 6 months"   OB History    No data available     Review of Systems  Constitutional: Negative for fatigue.  HENT: Negative for facial swelling and tinnitus.   Eyes: Negative for photophobia, pain and visual disturbance.  Respiratory: Negative for shortness of breath.   Cardiovascular: Negative for chest pain.  Gastrointestinal: Negative for nausea and vomiting.  Musculoskeletal: Positive for myalgias and arthralgias.  Negative for back pain, gait problem and neck pain.  Skin: Negative for wound.  Neurological: Negative for dizziness, weakness, light-headedness, numbness and headaches.  Psychiatric/Behavioral: Negative for confusion and decreased concentration.    Allergies  Abilify; Augmentin; Bactrim; Ceftin; Diclofenac; Indocin; Linaclotide; Lisinopril; Morphine; Morphine and related; Simvastatin; Sulfa antibiotics; Sulfamethoxazole-trimethoprim; Wellbutrin; and Quetiapine fumarate  Home Medications   Prior to Admission medications   Medication Sig Start Date End Date Taking? Authorizing Provider  apixaban (ELIQUIS) 5 MG TABS tablet Take 1 tablet (5 mg total) by mouth 2 (two) times daily. 12/12/14   Rhonda G Barrett, PA-C  BIOTIN PO Take 1 tablet by mouth daily.    Historical Provider, MD  carvedilol (COREG) 3.125 MG tablet Take 1 tablet (3.125 mg total) by mouth 2 (two) times daily with a meal. 01/08/15   Liliane Shi, PA-C  cyclobenzaprine (FLEXERIL) 10 MG tablet Take 1 tablet (10 mg total) by mouth every 8 (eight) hours. 06/22/15   Steele Sizer, MD  dicyclomine (BENTYL) 10 MG capsule TAKE 2 CAPSULES BY MOUTH EVERY SIX HOURS 04/09/15   [name], MD  diltiazem (CARDIZEM CD) 120 MG 24 hr capsule TAKE ONE CAPSULE BY MOUTH EVERY DAY. 07/06/15   Burnell Blanks, MD  diphenhydrAMINE (BENADRYL) 25 MG tablet Take 25 mg by mouth every 8 (eight) hours as needed for allergies.     Historical Provider, MD  escitalopram (LEXAPRO) 10 MG tablet TAKE 1 TABLET BY MOUTH EVERY DAY 03/08/15   [name], MD  furosemide (LASIX) 20 MG tablet Take 20 mg by mouth daily as needed for fluid or edema.    Historical Provider, MD  gabapentin (NEURONTIN) 600 MG tablet Take 1 tablet (600 mg total) by mouth every 8 (eight) hours. 04/24/15   [name], MD  isosorbide mononitrate (IMDUR) 30 MG 24 hr tablet Take 1 tablet (30 mg total) by mouth daily. 03/22/15   Burnell Blanks, MD  Multiple Vitamin  (MULTIVITAMIN WITH MINERALS) TABS tablet Take 2 tablets by mouth daily.     Historical Provider, MD  NIFEdipine (PROCARDIA-XL/ADALAT-CC/NIFEDICAL-XL) 30 MG 24 hr tablet Take by mouth.    Historical Provider, MD  nitroGLYCERIN (NITROSTAT) 0.4 MG SL tablet Place 1 tablet (0.4 mg total) under the tongue every 5 (five) minutes as needed for chest pain. Take 1 NTG, under your tongue, while sitting. If no relief of pain may repeat NTG, one tab every 5 minutes up to 3 tablets total over 15 minutes. 03/20/15   Rhonda G Barrett, PA-C  ondansetron (ZOFRAN) 4 MG tablet Take 1 tablet (4 mg total) by mouth every 8 (eight) hours as needed for nausea or vomiting. 06/05/15   [name], MD  oxyCODONE-acetaminophen (PERCOCET) 10-325 MG tablet Take 1 tablet by mouth every 6 (six) hours as needed for pain. 06/29/15   Ashany  Nadine Counts, MD  pantoprazole (PROTONIX) 40 MG tablet TAKE 1 TABLET BY MOUTH EVERY DAY 04/04/15   [name], MD  traZODone (DESYREL) 50 MG tablet Take 1 tablet (50 mg total) by mouth at bedtime. 04/24/15   [name], MD   BP 131/67 mmHg  Pulse 70  Temp(Src) 98.1 F (36.7 C) (Oral)  Resp 18  SpO2 97%   Physical Exam  Constitutional: She is oriented to person, place, and time. She appears well-developed and well-nourished.  HENT:  Head: Normocephalic and atraumatic. Head is without raccoon's eyes and without Battle's sign.  Right Ear: Tympanic membrane, external ear and ear canal normal. No hemotympanum.  Left Ear: Tympanic membrane, external ear and ear canal normal. No hemotympanum.  Nose: Nose normal. No nasal septal hematoma.  Mouth/Throat: Uvula is midline, oropharynx is clear and moist and mucous membranes are normal.  L facial pain over zygoma and forehead. No bruising or deformity.   Eyes: Conjunctivae, EOM and lids are normal. Pupils are equal, round, and reactive to light. Right eye exhibits no nystagmus. Left eye exhibits no nystagmus.  No visible hyphema noted  Neck:  Normal range of motion. Neck supple.  Cardiovascular: Normal rate and regular rhythm.   Pulmonary/Chest: Effort normal and breath sounds normal.  Abdominal: Soft. There is no tenderness.  Musculoskeletal:       Left shoulder: Normal.       Left elbow: Normal.       Left wrist: She exhibits tenderness. She exhibits normal range of motion and no bony tenderness.       Right hip: Normal.       Left hip: She exhibits tenderness. She exhibits normal range of motion, normal strength and no bony tenderness.       Right knee: Normal.       Left knee: Normal.       Right ankle: Normal.       Left ankle: Normal.       Cervical back: She exhibits normal range of motion, no tenderness and no bony tenderness.       Thoracic back: She exhibits no tenderness and no bony tenderness.       Lumbar back: She exhibits no tenderness and no bony tenderness.       Left upper arm: Normal.       Left forearm: She exhibits tenderness and bony tenderness. She exhibits no swelling, no edema and no deformity.       Left hand: Normal.  Neurological: She is alert and oriented to person, place, and time. She has normal strength and normal reflexes. No cranial nerve deficit or sensory deficit. Coordination normal. GCS eye subscore is 4. GCS verbal subscore is 5. GCS motor subscore is 6.  Skin: Skin is warm and dry.  Psychiatric: She has a normal mood and affect.  Nursing note and vitals reviewed.   ED Course  Procedures (including critical care time) Labs Review Labs Reviewed - No data to display  Imaging Review Dg Forearm Left  07/07/2015  CLINICAL DATA:  Fall with left forearm/wrist pain. EXAM: LEFT FOREARM - 2 VIEW COMPARISON:  None. FINDINGS: No fracture or suspicious focal osseous lesion is on these left forearm views. Well corticated osseous fragment at the ulnar styloid is likely a developmental ossicle or the sequela of remote injury. Small spur is present at the left olecranon. IMPRESSION: Negative.  Electronically Signed   By: Ilona Sorrel M.D.   On: 07/07/2015 13:40   Ct Head Wo  Contrast  07/07/2015  CLINICAL DATA:  Post fall. EXAM: CT HEAD WITHOUT CONTRAST CT MAXILLOFACIAL WITHOUT CONTRAST TECHNIQUE: Multidetector CT imaging of the head and maxillofacial structures were performed using the standard protocol without intravenous contrast. Multiplanar CT image reconstructions of the maxillofacial structures were also generated. COMPARISON:  None. FINDINGS: CT HEAD FINDINGS The gray-white differentiation is maintained. No CT evidence acute large territory infarct. No intraparenchymal or extra-axial mass or hemorrhage. Normal size a configuration of the ventricles and basilar cisterns. No midline shift. There is underpneumatization of the right frontal sinus. The remaining paranasal sinuses and mastoid air cells are normally aerated. No air-fluid levels. Regional soft tissues appear normal. No displaced calvarial fracture. CT MAXILLOFACIAL FINDINGS No displaced facial fracture. Regional soft tissues appear normal. No radiopaque foreign body. Normal appearance of the bilateral pterygoid plates. Normal appearance of the bilateral zygomatic arches. Normal appearance of the mandible. Post mandibular condyles are normally located. Normal noncontrast appearance of the bilateral orbits and globes. Normal appearance of the bilateral lamina papyracea. There is minimal polypoid mucosal thickening of the bilateral maxillary sinuses, right greater than left. There is underpneumatization of the right frontal sinus. The remaining paranasal sinuses and mastoid air cells are normally aerated. No air-fluid levels. There is mild leftward nasal septal deviation measuring approximately 3 mm in diameter. Limited visualization the superior aspect the cervical spine demonstrates the sequela of C5 through at least C7 ACDF and interbody fusion. Mild to moderate DDD of C3-C4 and C4-C5 with disc space height loss, endplate irregularity  and sclerosis. The dens is normally positioned and a lateral masses of C1. Normal atlantodental and atlantoaxial articulations. IMPRESSION: 1. Negative noncontrast head CT. 2. No displaced facial fracture.  No radiopaque foreign body. 3. Post C5 through (at least) C7 ACDF without evidence of hardware failure or loosening through the imaged components. Electronically Signed   By: Sandi Mariscal M.D.   On: 07/07/2015 13:59   Dg Hip Unilat With Pelvis 2-3 Views Left  07/07/2015  CLINICAL DATA:  Post fall, now with left hip pain. EXAM: DG HIP (WITH OR WITHOUT PELVIS) 2-3V LEFT COMPARISON:  None. FINDINGS: No fracture or dislocation. Post left total hip replacement without evidence of hardware failure or loosening. Limited visualization of the adjacent pelvis is normal. Suspected mild degenerate change of the contralateral right hip with joint space loss, subchondral sclerosis and osteophytosis. Several phleboliths overlie the lower pelvis bilaterally. Regional soft tissues appear otherwise normal. IMPRESSION: 1. No acute findings. 2. Post left total hip replacement without evidence of hardware failure or loosening. Electronically Signed   By: Sandi Mariscal M.D.   On: 07/07/2015 13:39   Ct Maxillofacial Wo Cm  07/07/2015  CLINICAL DATA:  Post fall. EXAM: CT HEAD WITHOUT CONTRAST CT MAXILLOFACIAL WITHOUT CONTRAST TECHNIQUE: Multidetector CT imaging of the head and maxillofacial structures were performed using the standard protocol without intravenous contrast. Multiplanar CT image reconstructions of the maxillofacial structures were also generated. COMPARISON:  None. FINDINGS: CT HEAD FINDINGS The gray-white differentiation is maintained. No CT evidence acute large territory infarct. No intraparenchymal or extra-axial mass or hemorrhage. Normal size a configuration of the ventricles and basilar cisterns. No midline shift. There is underpneumatization of the right frontal sinus. The remaining paranasal sinuses and mastoid  air cells are normally aerated. No air-fluid levels. Regional soft tissues appear normal. No displaced calvarial fracture. CT MAXILLOFACIAL FINDINGS No displaced facial fracture. Regional soft tissues appear normal. No radiopaque foreign body. Normal appearance of the bilateral pterygoid plates. Normal appearance  of the bilateral zygomatic arches. Normal appearance of the mandible. Post mandibular condyles are normally located. Normal noncontrast appearance of the bilateral orbits and globes. Normal appearance of the bilateral lamina papyracea. There is minimal polypoid mucosal thickening of the bilateral maxillary sinuses, right greater than left. There is underpneumatization of the right frontal sinus. The remaining paranasal sinuses and mastoid air cells are normally aerated. No air-fluid levels. There is mild leftward nasal septal deviation measuring approximately 3 mm in diameter. Limited visualization the superior aspect the cervical spine demonstrates the sequela of C5 through at least C7 ACDF and interbody fusion. Mild to moderate DDD of C3-C4 and C4-C5 with disc space height loss, endplate irregularity and sclerosis. The dens is normally positioned and a lateral masses of C1. Normal atlantodental and atlantoaxial articulations. IMPRESSION: 1. Negative noncontrast head CT. 2. No displaced facial fracture.  No radiopaque foreign body. 3. Post C5 through (at least) C7 ACDF without evidence of hardware failure or loosening through the imaged components. Electronically Signed   By: Sandi Mariscal M.D.   On: 07/07/2015 13:59   I have personally reviewed and evaluated these images and lab results as part of my medical decision-making.   EKG Interpretation None       12:58 PM Patient seen and examined. Work-up initiated. Medications ordered.   Vital signs reviewed and are as follows: BP 131/67 mmHg  Pulse 70  Temp(Src) 98.1 F (36.7 C) (Oral)  Resp 18  SpO2 97%  Imaging negative. Patient is  reassured and is happy that she'll be able to go home. Patient counseled on expected course of stiffness and soreness. Counseled to continue her usual home pain medications, use heat, any strenuous activities involving the areas involved.  Patient urged to return with worsening symptoms or other concerns. Patient verbalized understanding and agrees with plan.    MDM   Final diagnoses:  Fall, initial encounter  Facial contusion, initial encounter  Forearm contusion, left, initial encounter  Hip pain, left   Patient who is on chronic anticoagulation with fall. Imaging of head and face are negative. Imaging of hip and forearm are also negative. Do not suspect any significant pain or thoracic or intra-abdominal injury. Patient has normal neurological exam and is at her baseline. She is ambulatory. Discharge to home with symptomatic treatment as above.  Carlisle Cater, PA-C 07/07/15 [place], MD 07/08/15 [phone]

## 2015-07-07 NOTE — ED Notes (Signed)
PA at bedside.

## 2015-07-07 NOTE — ED Notes (Signed)
Pt was upstairs visiting last night and fell down when trying to bend over to pick something up. Pt reports hitting head on her walker, her left arm, and left hip (pt has total hip replacement on that side). Pt able to move left wrist but with pain and stiffness. Pt reports being able to bear weight on the left leg.

## 2015-07-13 ENCOUNTER — Encounter (HOSPITAL_COMMUNITY): Payer: Self-pay | Admitting: Emergency Medicine

## 2015-07-13 ENCOUNTER — Emergency Department (HOSPITAL_COMMUNITY)
Admission: EM | Admit: 2015-07-13 | Discharge: 2015-07-14 | Disposition: A | Discharge status: Home / Self Care | Payer: Medicare Other | Attending: Emergency Medicine | Admitting: Emergency Medicine

## 2015-07-13 DIAGNOSIS — E119 Type 2 diabetes mellitus without complications: Secondary | ICD-10-CM | POA: Insufficient documentation

## 2015-07-13 DIAGNOSIS — R05 Cough: Secondary | ICD-10-CM | POA: Insufficient documentation

## 2015-07-13 DIAGNOSIS — M1990 Unspecified osteoarthritis, unspecified site: Secondary | ICD-10-CM | POA: Diagnosis not present

## 2015-07-13 DIAGNOSIS — I252 Old myocardial infarction: Secondary | ICD-10-CM | POA: Insufficient documentation

## 2015-07-13 DIAGNOSIS — K589 Irritable bowel syndrome without diarrhea: Secondary | ICD-10-CM | POA: Insufficient documentation

## 2015-07-13 DIAGNOSIS — K219 Gastro-esophageal reflux disease without esophagitis: Secondary | ICD-10-CM | POA: Insufficient documentation

## 2015-07-13 DIAGNOSIS — Z86711 Personal history of pulmonary embolism: Secondary | ICD-10-CM | POA: Diagnosis not present

## 2015-07-13 DIAGNOSIS — R110 Nausea: Secondary | ICD-10-CM | POA: Diagnosis not present

## 2015-07-13 DIAGNOSIS — Z8701 Personal history of pneumonia (recurrent): Secondary | ICD-10-CM | POA: Insufficient documentation

## 2015-07-13 DIAGNOSIS — R079 Chest pain, unspecified: Secondary | ICD-10-CM | POA: Diagnosis not present

## 2015-07-13 DIAGNOSIS — G8929 Other chronic pain: Secondary | ICD-10-CM | POA: Diagnosis not present

## 2015-07-13 DIAGNOSIS — Z79899 Other long term (current) drug therapy: Secondary | ICD-10-CM | POA: Diagnosis not present

## 2015-07-13 DIAGNOSIS — Z7901 Long term (current) use of anticoagulants: Secondary | ICD-10-CM | POA: Insufficient documentation

## 2015-07-13 DIAGNOSIS — G43909 Migraine, unspecified, not intractable, without status migrainosus: Secondary | ICD-10-CM | POA: Insufficient documentation

## 2015-07-13 DIAGNOSIS — Z862 Personal history of diseases of the blood and blood-forming organs and certain disorders involving the immune mechanism: Secondary | ICD-10-CM | POA: Diagnosis not present

## 2015-07-13 DIAGNOSIS — Z87891 Personal history of nicotine dependence: Secondary | ICD-10-CM | POA: Insufficient documentation

## 2015-07-13 DIAGNOSIS — R21 Rash and other nonspecific skin eruption: Secondary | ICD-10-CM | POA: Diagnosis present

## 2015-07-13 DIAGNOSIS — L509 Urticaria, unspecified: Secondary | ICD-10-CM | POA: Diagnosis not present

## 2015-07-13 DIAGNOSIS — F329 Major depressive disorder, single episode, unspecified: Secondary | ICD-10-CM | POA: Insufficient documentation

## 2015-07-13 DIAGNOSIS — L506 Contact urticaria: Secondary | ICD-10-CM | POA: Insufficient documentation

## 2015-07-13 DIAGNOSIS — I10 Essential (primary) hypertension: Secondary | ICD-10-CM | POA: Diagnosis not present

## 2015-07-13 MED ORDER — FAMOTIDINE IN NACL 20-0.9 MG/50ML-% IV SOLN
20.0000 mg | Freq: Once | INTRAVENOUS | Status: AC
  Administered 2015-07-13: 20 mg via INTRAVENOUS
  Filled 2015-07-13: qty 50

## 2015-07-13 MED ORDER — METHYLPREDNISOLONE SODIUM SUCC 125 MG IJ SOLR
125.0000 mg | Freq: Once | INTRAMUSCULAR | Status: AC
  Administered 2015-07-13: 125 mg via INTRAVENOUS
  Filled 2015-07-13: qty 2

## 2015-07-13 MED ORDER — SODIUM CHLORIDE 0.9 % IV BOLUS (SEPSIS)
1000.0000 mL | Freq: Once | INTRAVENOUS | Status: AC
  Administered 2015-07-13: 1000 mL via INTRAVENOUS

## 2015-07-13 NOTE — ED Notes (Signed)
C/o ears and around hairline being red and itchy since this morning.  States she now has a rash around neck and has started coughing.  Pt states she had hair color put in 1 1/2 days ago.

## 2015-07-13 NOTE — ED Provider Notes (Signed)
CSN: [id number]     Arrival date & time 07/13/15  2225 History   First MD Initiated Contact with Patient 07/13/15 2313     Chief Complaint  Patient presents with  . Allergic Reaction     (Consider location/radiation/quality/duration/timing/severity/associated sxs/prior Treatment) HPI Comments: Patient is a 59 year old female with a history of hypertension, PE on chronic Eliquis, ACS, DM, IBS, depression, PTSD, and schizoaffective disorder presents to the emergency department for evaluation of a rash. Patient states that she awoke this morning and noted that her ears were swollen and itchy. She states that she noticed a rash developing around her hairline and down her neck during the afternoon. Symptoms have been worsening since onset. She now feels an itching sensation in her arms. She has not noticed a rash to her extremities. She reports a mild central chest discomfort which has since resolved. She reports feeling like it was "difficult to get air" PTA; no SOB at present. No syncope. No lip swelling or tongue swelling or vomiting. Patient reports using a new hair dye 1.5 days ago; it is a different brand from the one she usually uses.  Patient is a 59 y.o. female presenting with allergic reaction. The history is provided by the patient. No language interpreter was used.  Allergic Reaction Presenting symptoms: rash   Presenting symptoms: no difficulty swallowing     Past Medical History  Diagnosis Date  . Spinal stenosis   . Hypertension   . Pulmonary embolism ([place]) 04/2001; 09/19/2014    "after gallbladder OR; "  . High cholesterol   . Myocardial infarction ([place]) 10/2010 X 3    "while hospitalized"  . Pneumonia 04/2014  . Sleep apnea     "mild" (09/19/2014)  . Type II diabetes mellitus ([place])     "dx'd in 2007; lost weight; no RX for ~ 4 yr now" (09/19/2014)  . Anemia   . History of blood transfusion 1985    related to hysterectomy  . Esophageal spasm   . Gastritis   .  Gastroenteritis   . IBS (irritable bowel syndrome)   . GERD (gastroesophageal reflux disease)   . Headache     "couple /month lately" (09/19/2014)  . Migraine     "a few times/yr" (09/19/2014)  . Osteoarthritis     "qwhere; mostly around my joints" (09/19/2014)  . Spondylosis   . Chronic back pain     "upper and lower" (09/19/2014)  . Depression     "major" (09/19/2014)  . PTSD (post-traumatic stress disorder)   . Schizoaffective disorder ([place])   . Snoring     a. sleep study 5/16: No OSA   Past Surgical History  Procedure Laterality Date  . Excision/release bursa hip Right 12/1984  . Total hip arthroplasty Left 04-06-2014  . Tonsillectomy and adenoidectomy  ~ 1968  . Appendectomy  1985  . Abdominal hysterectomy  1985  . Cholecystectomy  2002  . Hernia repair  1985  . Umbilical hernia repair  1985  . Vena cava filter placement  03/2014  . Back surgery    . Anterior cervical decomp/discectomy fusion  10/2012  . Breast cyst excision Right   . Cardiac catheterization   2000's; 2009  . Left heart catheterization with coronary angiogram N/A 12/09/2014    Procedure: LEFT HEART CATHETERIZATION WITH CORONARY ANGIOGRAM;  Surgeon: Burnell Blanks, MD;  Location: [facility] CATH LAB;  Service: Cardiovascular;  Laterality: N/A;   Family History  Problem Relation Age of Onset  . Heart attack  Neg Hx   . Stroke Mother    Social History  Substance Use Topics  . Smoking status: Former Smoker -- 1.50 packs/day for 10 years    Types: Cigarettes    Quit date: 04/12/1979  . Smokeless tobacco: Never Used  . Alcohol Use: 0.0 oz/week    0 Standard drinks or equivalent per week     Comment: 09/19/2014 "nothing in the last 6 months"   OB History    No data available      Review of Systems  Constitutional: Negative for fever.  HENT: Negative for drooling and trouble swallowing.   Respiratory: Positive for cough. Negative for shortness of breath.   Cardiovascular: Positive for chest pain.   Gastrointestinal: Positive for nausea. Negative for vomiting.  Skin: Positive for rash.  Neurological: Negative for syncope.  All other systems reviewed and are negative.   Allergies  Abilify; Augmentin; Bactrim; Ceftin; Diclofenac; Indocin; Linaclotide; Lisinopril; Morphine; Morphine and related; Simvastatin; Sulfa antibiotics; Sulfamethoxazole-trimethoprim; Wellbutrin; and Quetiapine fumarate  Home Medications   Prior to Admission medications   Medication Sig Start Date End Date Taking? Authorizing Provider  apixaban (ELIQUIS) 5 MG TABS tablet Take 1 tablet (5 mg total) by mouth 2 (two) times daily. 12/12/14   Rhonda G Barrett, PA-C  BIOTIN PO Take 1 tablet by mouth daily.    Historical Provider, MD  calcium carbonate (TUMS - DOSED IN MG ELEMENTAL CALCIUM) 500 MG chewable tablet Chew 1 tablet by mouth daily as needed for indigestion or heartburn.    Historical Provider, MD  carvedilol (COREG) 3.125 MG tablet Take 1 tablet (3.125 mg total) by mouth 2 (two) times daily with a meal. 01/08/15   Liliane Shi, PA-C  cyclobenzaprine (FLEXERIL) 10 MG tablet Take 1 tablet (10 mg total) by mouth every 8 (eight) hours. 06/22/15   Steele Sizer, MD  dicyclomine (BENTYL) 10 MG capsule TAKE 2 CAPSULES BY MOUTH EVERY SIX HOURS 04/09/15   [name], MD  diltiazem (CARDIZEM CD) 120 MG 24 hr capsule TAKE ONE CAPSULE BY MOUTH EVERY DAY. 07/06/15   Burnell Blanks, MD  diphenhydrAMINE (BENADRYL) 25 MG tablet Take 1-2 tablets (25-50 mg total) by mouth every 6 (six) hours as needed for itching (Rash). 07/14/15   Antonietta Breach, PA-C  escitalopram (LEXAPRO) 10 MG tablet TAKE 1 TABLET BY MOUTH EVERY DAY 03/08/15   [name], MD  famotidine (PEPCID) 20 MG tablet Take 1 tablet (20 mg total) by mouth 2 (two) times daily. Take for itching 07/14/15   Antonietta Breach, PA-C  furosemide (LASIX) 20 MG tablet Take 20 mg by mouth daily as needed for fluid or edema.    Historical Provider, MD  gabapentin (NEURONTIN)  600 MG tablet Take 1 tablet (600 mg total) by mouth every 8 (eight) hours. 04/24/15   [name], MD  hydrOXYzine (ATARAX/VISTARIL) 25 MG tablet Take 1 tablet (25 mg total) by mouth every 6 (six) hours as needed for itching. 07/14/15   Antonietta Breach, PA-C  isosorbide mononitrate (IMDUR) 30 MG 24 hr tablet Take 1 tablet (30 mg total) by mouth daily. 03/22/15   Burnell Blanks, MD  Multiple Vitamin (MULTIVITAMIN WITH MINERALS) TABS tablet Take 2 tablets by mouth daily.     Historical Provider, MD  NIFEdipine (PROCARDIA-XL/ADALAT-CC/NIFEDICAL-XL) 30 MG 24 hr tablet Take 30 mg by mouth daily.     Historical Provider, MD  ondansetron (ZOFRAN) 4 MG tablet Take 1 tablet (4 mg total) by mouth every 8 (eight) hours as needed  for nausea or vomiting. 06/05/15   [name], MD  oxyCODONE-acetaminophen (PERCOCET) 10-325 MG tablet Take 1 tablet by mouth every 6 (six) hours as needed for pain. 06/29/15   [name], MD  pantoprazole (PROTONIX) 40 MG tablet TAKE 1 TABLET BY MOUTH EVERY DAY 04/04/15   [name], MD  potassium chloride SA (K-DUR,KLOR-CON) 20 MEQ tablet Take 20 mEq by mouth daily as needed. Take along with lasix for potassium replacement    Historical Provider, MD  Sennosides (EX-LAX) 15 MG CHEW Chew 1 tablet by mouth daily as needed. For constipation    Historical Provider, MD  traZODone (DESYREL) 50 MG tablet Take 1 tablet (50 mg total) by mouth at bedtime. Patient taking differently: Take 50 mg by mouth at bedtime as needed for sleep.  04/24/15   [name], MD   BP 130/82 mmHg  Pulse 67  Temp(Src) 98.1 F (36.7 C) (Oral)  Resp 15  Ht 5\' 6"  (1.676 m)  Wt 210 lb (95.255 kg)  BMI 33.91 kg/m2  SpO2 95%   Physical Exam  Constitutional: She is oriented to person, place, and time. She appears well-developed and well-nourished. No distress.  Nontoxic/nonseptic appearing  HENT:  Head: Normocephalic and atraumatic.  Mouth/Throat: Oropharynx is clear and moist. No  oropharyngeal exudate.  Oropharynx clear. No angioedema. Patient tolerating secretions without difficulty.  Eyes: Conjunctivae and EOM are normal. No scleral icterus.  Neck: Normal range of motion.  No stridor  Cardiovascular: Normal rate, regular rhythm and intact distal pulses.   Pulmonary/Chest: Effort normal and breath sounds normal. No respiratory distress. She has no wheezes. She has no rales.  Lungs clear bilaterally. Chest expansion symmetric. No tachypnea or dyspnea.  Musculoskeletal: Normal range of motion.  Neurological: She is alert and oriented to person, place, and time. She exhibits normal muscle tone. Coordination normal.  Skin: Skin is warm and dry. Rash noted. She is not diaphoretic. No erythema. No pallor.  Erythematous slightly raised maculopapular, blanching, pruritic, rash around hairline and noted on posterior neck. No skin peeling, or scaling. No vesicles or pustules.  Psychiatric: She has a normal mood and affect. Her behavior is normal.  Nursing note and vitals reviewed.   ED Course  Procedures (including critical care time) Labs Review Labs Reviewed - No data to display  Imaging Review No results found.   I have personally reviewed and evaluated these images and lab results as part of my medical decision-making.   EKG Interpretation   Date/Time:  Saturday July 14 2015 00:07:19 EST Ventricular Rate:  74 PR Interval:  175 QRS Duration: 93 QT Interval:  395 QTC Calculation: 438 R Axis:   33 Text Interpretation:  Sinus rhythm Probable left atrial enlargement  Borderline T abnormalities, inferior leads No significant change since  last EKG tracing  Confirmed by LIU MD, DANA [id number]) on 07/14/2015 12:15:57  AM       0030 - Patient with no worsening of symptoms; states that she doesn't feel much better, still c/o itching. Will order atarax. Unable to give Benadryl as patient drove to ED and plans to drive home. MDM   Final diagnoses:  Contact  urticaria    59 year old female presents to the emergency department for an itching rash around her hairline after using a new brand of hair dye. Patient with physical exam findings consistent with contact dermatitis/urticaria. Patient has no respiratory involvement or angioedema on exam. She has no tachypnea, dyspnea, or hypoxia. No drooling; tolerating secretions without difficulty.  Patient  treated with fluids as well as Pepcid, Atarax, and Solu-Medrol. She was unable to receive Benadryl the emergency department as she drove herself to the ED. Patient has had improvement in her symptoms with this regimen. Do not believe further emergent workup is indicated. Will discharge with instructions for supportive care. Primary care follow-up advised for a recheck of symptoms. Return precautions provided. Patient agreeable to plan with no unaddressed concerns. Patient discharged in good condition.   Filed Vitals:   07/13/15 2229 07/13/15 2330 07/14/15 0000 07/14/15 0045  BP: 157/87 138/81 129/76 130/82  Pulse: 88 81 73 67  Temp: 98.1 F (36.7 C)     TempSrc: Oral     Resp: 16   15  Height: 5\' 6"  (1.676 m)     Weight: 210 lb (95.255 kg)     SpO2: 96% 91% 93% 95%     Antonietta Breach, PA-C 07/14/15 [id number]  Forde Dandy, MD 07/14/15 1311

## 2015-07-14 DIAGNOSIS — L506 Contact urticaria: Secondary | ICD-10-CM | POA: Diagnosis not present

## 2015-07-14 MED ORDER — FAMOTIDINE 20 MG PO TABS: 20.0000 mg | ORAL_TABLET | Freq: Two times a day (BID) | ORAL | Status: DC

## 2015-07-14 MED ORDER — HYDROXYZINE HCL 25 MG PO TABS
50.0000 mg | ORAL_TABLET | Freq: Once | ORAL | Status: AC
  Administered 2015-07-14: 50 mg via ORAL
  Filled 2015-07-14: qty 2

## 2015-07-14 MED ORDER — HYDROXYZINE HCL 25 MG PO TABS: 25.0000 mg | ORAL_TABLET | Freq: Four times a day (QID) | ORAL | Status: DC | PRN

## 2015-07-14 MED ORDER — DIPHENHYDRAMINE HCL 25 MG PO TABS: 25.0000 mg | ORAL_TABLET | Freq: Four times a day (QID) | ORAL | Status: DC | PRN

## 2015-07-14 NOTE — Discharge Instructions (Signed)
Discontinue use of [employer] hair dye. Take Benadryl, Atarax, and Pepcid as prescribed for rash/itching. Follow up with your primary doctor for a recheck of symptoms. Return to the ED if you experience loss of consciousness, fever over 100.42F, trouble swallowing/drooling, vomiting, or severe swelling of your lips or tongue.  Hives Hives are itchy, red, swollen areas of the skin. They can vary in size and location on your body. Hives can come and go for hours or several days (acute hives) or for several weeks (chronic hives). Hives do not spread from person to person (noncontagious). They may get worse with scratching, exercise, and emotional stress. CAUSES   Allergic reaction to food, additives, or drugs.  Infections, including the common cold.  Illness, such as vasculitis, lupus, or thyroid disease.  Exposure to sunlight, heat, or cold.  Exercise.  Stress.  Contact with chemicals. SYMPTOMS   Red or white swollen patches on the skin. The patches may change size, shape, and location quickly and repeatedly.  Itching.  Swelling of the hands, feet, and face. This may occur if hives develop deeper in the skin. DIAGNOSIS  Your caregiver can usually tell what is wrong by performing a physical exam. Skin or blood tests may also be done to determine the cause of your hives. In some cases, the cause cannot be determined. TREATMENT  Mild cases usually get better with medicines such as antihistamines. Severe cases may require an emergency epinephrine injection. If the cause of your hives is known, treatment includes avoiding that trigger.  HOME CARE INSTRUCTIONS   Avoid causes that trigger your hives.  Take antihistamines as directed by your caregiver to reduce the severity of your hives. Non-sedating or low-sedating antihistamines are usually recommended. Do not drive while taking an antihistamine.  Take any other medicines prescribed for itching as directed by your caregiver.  Wear  loose-fitting clothing.  Keep all follow-up appointments as directed by your caregiver. SEEK MEDICAL CARE IF:   You have persistent or severe itching that is not relieved with medicine.  You have painful or swollen joints. SEEK IMMEDIATE MEDICAL CARE IF:   You have a fever over 100.42F.  You experience loss of consciousness  Your tongue or lips are swollen.  You have trouble swallowing with drooling.  You feel worsening tightness in the throat or chest.  You have abdominal pain. These problems may be the first sign of a life-threatening allergic reaction. Call your local emergency services (911 in [place] MAKE SURE YOU:   Understand these instructions.  Will watch your condition.  Will get help right away if you are not doing well or get worse.   This information is not intended to replace advice given to you by your health care provider. Make sure you discuss any questions you have with your health care provider.   Document Released: 08/18/2005 Document Revised: 08/23/2013 Document Reviewed: 11/11/2011 Elsevier Interactive Patient Education [employer].

## 2015-07-19 ENCOUNTER — Emergency Department (HOSPITAL_COMMUNITY)
Admission: EM | Admit: 2015-07-19 | Discharge: 2015-07-19 | Disposition: A | Discharge status: Home / Self Care | Payer: Medicare Other | Attending: Emergency Medicine | Admitting: Emergency Medicine

## 2015-07-19 ENCOUNTER — Encounter (HOSPITAL_COMMUNITY): Payer: Self-pay

## 2015-07-19 ENCOUNTER — Emergency Department (HOSPITAL_COMMUNITY): Payer: Medicare Other

## 2015-07-19 DIAGNOSIS — R0600 Dyspnea, unspecified: Secondary | ICD-10-CM | POA: Diagnosis not present

## 2015-07-19 DIAGNOSIS — E119 Type 2 diabetes mellitus without complications: Secondary | ICD-10-CM | POA: Diagnosis not present

## 2015-07-19 DIAGNOSIS — G8929 Other chronic pain: Secondary | ICD-10-CM | POA: Insufficient documentation

## 2015-07-19 DIAGNOSIS — Z86711 Personal history of pulmonary embolism: Secondary | ICD-10-CM | POA: Insufficient documentation

## 2015-07-19 DIAGNOSIS — I252 Old myocardial infarction: Secondary | ICD-10-CM | POA: Insufficient documentation

## 2015-07-19 DIAGNOSIS — I10 Essential (primary) hypertension: Secondary | ICD-10-CM | POA: Insufficient documentation

## 2015-07-19 DIAGNOSIS — Z9889 Other specified postprocedural states: Secondary | ICD-10-CM | POA: Insufficient documentation

## 2015-07-19 DIAGNOSIS — R05 Cough: Secondary | ICD-10-CM | POA: Diagnosis not present

## 2015-07-19 DIAGNOSIS — R0602 Shortness of breath: Secondary | ICD-10-CM | POA: Diagnosis present

## 2015-07-19 DIAGNOSIS — Z79899 Other long term (current) drug therapy: Secondary | ICD-10-CM | POA: Insufficient documentation

## 2015-07-19 DIAGNOSIS — Z87891 Personal history of nicotine dependence: Secondary | ICD-10-CM | POA: Diagnosis not present

## 2015-07-19 DIAGNOSIS — K589 Irritable bowel syndrome without diarrhea: Secondary | ICD-10-CM | POA: Insufficient documentation

## 2015-07-19 DIAGNOSIS — Z8669 Personal history of other diseases of the nervous system and sense organs: Secondary | ICD-10-CM | POA: Diagnosis not present

## 2015-07-19 DIAGNOSIS — F329 Major depressive disorder, single episode, unspecified: Secondary | ICD-10-CM | POA: Diagnosis not present

## 2015-07-19 DIAGNOSIS — G43909 Migraine, unspecified, not intractable, without status migrainosus: Secondary | ICD-10-CM | POA: Diagnosis not present

## 2015-07-19 DIAGNOSIS — Z8701 Personal history of pneumonia (recurrent): Secondary | ICD-10-CM | POA: Insufficient documentation

## 2015-07-19 DIAGNOSIS — K219 Gastro-esophageal reflux disease without esophagitis: Secondary | ICD-10-CM | POA: Diagnosis not present

## 2015-07-19 DIAGNOSIS — J90 Pleural effusion, not elsewhere classified: Secondary | ICD-10-CM

## 2015-07-19 DIAGNOSIS — Z862 Personal history of diseases of the blood and blood-forming organs and certain disorders involving the immune mechanism: Secondary | ICD-10-CM | POA: Insufficient documentation

## 2015-07-19 DIAGNOSIS — Z7902 Long term (current) use of antithrombotics/antiplatelets: Secondary | ICD-10-CM | POA: Insufficient documentation

## 2015-07-19 DIAGNOSIS — M1990 Unspecified osteoarthritis, unspecified site: Secondary | ICD-10-CM | POA: Diagnosis not present

## 2015-07-19 DIAGNOSIS — K2970 Gastritis, unspecified, without bleeding: Secondary | ICD-10-CM | POA: Insufficient documentation

## 2015-07-19 LAB — URINALYSIS, ROUTINE W REFLEX MICROSCOPIC
Bilirubin Urine: NEGATIVE
Glucose, UA: NEGATIVE mg/dL
Hgb urine dipstick: NEGATIVE
Ketones, ur: NEGATIVE mg/dL
Nitrite: NEGATIVE
Protein, ur: NEGATIVE mg/dL
Specific Gravity, Urine: 1.028 (ref 1.005–1.030)
pH: 5 (ref 5.0–8.0)

## 2015-07-19 LAB — BASIC METABOLIC PANEL
Anion gap: 8 (ref 5–15)
BUN: 7 mg/dL (ref 6–20)
CO2: 27 mmol/L (ref 22–32)
Calcium: 9.4 mg/dL (ref 8.9–10.3)
Chloride: 104 mmol/L (ref 101–111)
Creatinine, Ser: 0.8 mg/dL (ref 0.44–1.00)
GFR calc Af Amer: >60 mL/min (ref >60)
GFR calc non Af Amer: >60 mL/min (ref >60)
Glucose, Bld: 98 mg/dL (ref 65–99)
Potassium: 4 mmol/L (ref 3.5–5.1)
Sodium: 139 mmol/L (ref 135–145)

## 2015-07-19 LAB — CBC
HCT: 37.8 % (ref 36.0–46.0)
Hemoglobin: 12.1 g/dL (ref 12.0–15.0)
MCH: 29 pg (ref 26.0–34.0)
MCHC: 32 g/dL (ref 30.0–36.0)
MCV: 90.6 fL (ref 78.0–100.0)
Platelets: 402 10*3/uL — ABNORMAL HIGH (ref 150–400)
RBC: 4.17 MIL/uL (ref 3.87–5.11)
RDW: 14.4 % (ref 11.5–15.5)
WBC: 7.2 10*3/uL (ref 4.0–10.5)

## 2015-07-19 LAB — URINE MICROSCOPIC-ADD ON: RBC / HPF: NONE SEEN RBC/hpf (ref 0–5)

## 2015-07-19 LAB — I-STAT TROPONIN, ED: Troponin i, poc: 0 ng/mL (ref 0.00–0.08)

## 2015-07-19 LAB — BRAIN NATRIURETIC PEPTIDE: B Natriuretic Peptide: 19.8 pg/mL (ref 0.0–100.0)

## 2015-07-19 MED ORDER — POTASSIUM CHLORIDE ER 10 MEQ PO TBCR: 10.0000 meq | EXTENDED_RELEASE_TABLET | Freq: Two times a day (BID) | ORAL | Status: DC

## 2015-07-19 MED ORDER — FUROSEMIDE 20 MG PO TABS: 20.0000 mg | ORAL_TABLET | Freq: Every day | ORAL | Status: DC | PRN

## 2015-07-19 NOTE — ED Provider Notes (Signed)
CSN: [id number]     Arrival date & time 07/19/15  1 History   First MD Initiated Contact with Patient 07/19/15 1812     Chief Complaint  Patient presents with  . Shortness of Breath     HPI  Valerie Vazquez presents for evaluation of shortness of breath. Describes a heaviness in her chest when Valerie Vazquez leans over forward or lays down at night. Does not have exertional pain or exertional dyspnea. Not syncopal or lightheaded. No cough sputum production difficulty breathing. Previously stick Lasix for a similar feeling when I had "fluid in my lungs".  Past Medical History  Diagnosis Date  . Spinal stenosis   . Hypertension   . Pulmonary embolism ([place]) 04/2001; 09/19/2014    "after gallbladder OR; "  . High cholesterol   . Myocardial infarction ([place]) 10/2010 X 3    "while hospitalized"  . Pneumonia 04/2014  . Sleep apnea     "mild" (09/19/2014)  . Type II diabetes mellitus ([place])     "dx'd in 2007; lost weight; no RX for ~ 4 yr now" (09/19/2014)  . Anemia   . History of blood transfusion 1985    related to hysterectomy  . Esophageal spasm   . Gastritis   . Gastroenteritis   . IBS (irritable bowel syndrome)   . GERD (gastroesophageal reflux disease)   . Headache     "couple /month lately" (09/19/2014)  . Migraine     "a few times/yr" (09/19/2014)  . Osteoarthritis     "qwhere; mostly around my joints" (09/19/2014)  . Spondylosis   . Chronic back pain     "upper and lower" (09/19/2014)  . Depression     "major" (09/19/2014)  . PTSD (post-traumatic stress disorder)   . Schizoaffective disorder ([place])   . Snoring     a. sleep study 5/16: No OSA   Past Surgical History  Procedure Laterality Date  . Excision/release bursa hip Right 12/1984  . Total hip arthroplasty Left 04-06-2014  . Tonsillectomy and adenoidectomy  ~ 1968  . Appendectomy  1985  . Abdominal hysterectomy  1985  . Cholecystectomy  2002  . Hernia repair  1985  . Umbilical hernia repair  1985  . Vena cava filter placement  03/2014   . Back surgery    . Anterior cervical decomp/discectomy fusion  10/2012  . Breast cyst excision Right   . Cardiac catheterization   2000's; 2009  . Left heart catheterization with coronary angiogram N/A 12/09/2014    Procedure: LEFT HEART CATHETERIZATION WITH CORONARY ANGIOGRAM;  Surgeon: Burnell Blanks, MD;  Location: [facility] CATH LAB;  Service: Cardiovascular;  Laterality: N/A;   Family History  Problem Relation Age of Onset  . Heart attack Neg Hx   . Stroke Mother    Social History  Substance Use Topics  . Smoking status: Former Smoker -- 1.50 packs/day for 10 years    Types: Cigarettes    Quit date: 04/12/1979  . Smokeless tobacco: Never Used  . Alcohol Use: 0.0 oz/week    0 Standard drinks or equivalent per week     Comment: 09/19/2014 "nothing in the last 6 months"   OB History    No data available     Review of Systems  Constitutional: Negative for fever, chills, diaphoresis, appetite change and fatigue.  HENT: Negative for mouth sores, sore throat and trouble swallowing.   Eyes: Negative for visual disturbance.  Respiratory: Positive for shortness of breath. Negative for cough, chest tightness and wheezing.  Cardiovascular: Negative for chest pain.  Gastrointestinal: Negative for nausea, vomiting, abdominal pain, diarrhea and abdominal distention.  Endocrine: Negative for polydipsia, polyphagia and polyuria.  Genitourinary: Negative for dysuria, frequency and hematuria.  Musculoskeletal: Negative for gait problem.  Skin: Negative for color change, pallor and rash.  Neurological: Negative for dizziness, syncope, light-headedness and headaches.  Hematological: Does not bruise/bleed easily.  Psychiatric/Behavioral: Negative for behavioral problems and confusion.      Allergies  Abilify; Augmentin; Bactrim; Ceftin; Diclofenac; Indocin; Linaclotide; Lisinopril; Morphine; Morphine and related; Simvastatin; Sulfa antibiotics; Sulfamethoxazole-trimethoprim; Wellbutrin;  and Quetiapine fumarate  Home Medications   Prior to Admission medications   Medication Sig Start Date End Date Taking? Authorizing Provider  apixaban (ELIQUIS) 5 MG TABS tablet Take 1 tablet (5 mg total) by mouth 2 (two) times daily. 12/12/14   Rhonda G Barrett, PA-C  BIOTIN PO Take 1 tablet by mouth daily.    Historical Provider, MD  calcium carbonate (TUMS - DOSED IN MG ELEMENTAL CALCIUM) 500 MG chewable tablet Chew 1 tablet by mouth daily as needed for indigestion or heartburn.    Historical Provider, MD  carvedilol (COREG) 3.125 MG tablet Take 1 tablet (3.125 mg total) by mouth 2 (two) times daily with a meal. 01/08/15   Liliane Shi, PA-C  cyclobenzaprine (FLEXERIL) 10 MG tablet Take 1 tablet (10 mg total) by mouth every 8 (eight) hours. 06/22/15   Steele Sizer, MD  dicyclomine (BENTYL) 10 MG capsule TAKE 2 CAPSULES BY MOUTH EVERY SIX HOURS 04/09/15   [name], MD  diltiazem (CARDIZEM CD) 120 MG 24 hr capsule TAKE ONE CAPSULE BY MOUTH EVERY DAY. 07/06/15   Burnell Blanks, MD  diphenhydrAMINE (BENADRYL) 25 MG tablet Take 1-2 tablets (25-50 mg total) by mouth every 6 (six) hours as needed for itching (Rash). 07/14/15   Antonietta Breach, PA-C  escitalopram (LEXAPRO) 10 MG tablet TAKE 1 TABLET BY MOUTH EVERY DAY 03/08/15   [name], MD  famotidine (PEPCID) 20 MG tablet Take 1 tablet (20 mg total) by mouth 2 (two) times daily. Take for itching 07/14/15   Antonietta Breach, PA-C  furosemide (LASIX) 20 MG tablet Take 1 tablet (20 mg total) by mouth daily as needed for fluid. 07/19/15   Tanna Furry, MD  gabapentin (NEURONTIN) 600 MG tablet Take 1 tablet (600 mg total) by mouth every 8 (eight) hours. 04/24/15   [name], MD  hydrOXYzine (ATARAX/VISTARIL) 25 MG tablet Take 1 tablet (25 mg total) by mouth every 6 (six) hours as needed for itching. 07/14/15   Antonietta Breach, PA-C  isosorbide mononitrate (IMDUR) 30 MG 24 hr tablet Take 1 tablet (30 mg total) by mouth daily. 03/22/15   Burnell Blanks, MD  Multiple Vitamin (MULTIVITAMIN WITH MINERALS) TABS tablet Take 2 tablets by mouth daily.     Historical Provider, MD  NIFEdipine (PROCARDIA-XL/ADALAT-CC/NIFEDICAL-XL) 30 MG 24 hr tablet Take 30 mg by mouth daily.     Historical Provider, MD  ondansetron (ZOFRAN) 4 MG tablet Take 1 tablet (4 mg total) by mouth every 8 (eight) hours as needed for nausea or vomiting. 06/05/15   [name], MD  oxyCODONE-acetaminophen (PERCOCET) 10-325 MG tablet Take 1 tablet by mouth every 6 (six) hours as needed for pain. 06/29/15   [name], MD  pantoprazole (PROTONIX) 40 MG tablet TAKE 1 TABLET BY MOUTH EVERY DAY 04/04/15   [name], MD  potassium chloride (K-DUR) 10 MEQ tablet Take 1 tablet (10 mEq total) by mouth 2 (two) times daily.  07/19/15   Tanna Furry, MD  potassium chloride SA (K-DUR,KLOR-CON) 20 MEQ tablet Take 20 mEq by mouth daily as needed. Take along with lasix for potassium replacement    Historical Provider, MD  Sennosides (EX-LAX) 15 MG CHEW Chew 1 tablet by mouth daily as needed. For constipation    Historical Provider, MD  traZODone (DESYREL) 50 MG tablet Take 1 tablet (50 mg total) by mouth at bedtime. Patient taking differently: Take 50 mg by mouth at bedtime as needed for sleep.  04/24/15   [name], MD   BP 100/57 mmHg  Pulse 73  Temp(Src) 98.9 F (37.2 C) (Oral)  Resp 17  Ht 5\' 6"  (1.676 m)  Wt 207 lb (93.895 kg)  BMI 33.43 kg/m2  SpO2 95% Physical Exam  Constitutional: Valerie Vazquez is oriented to person, place, and time. Valerie Vazquez appears well-developed and well-nourished. No distress.  HENT:  Head: Normocephalic.  Eyes: Conjunctivae are normal. Pupils are equal, round, and reactive to light. No scleral icterus.  Neck: Normal range of motion. Neck supple. No thyromegaly present.  Cardiovascular: Normal rate and regular rhythm.  Exam reveals no gallop and no friction rub.   No murmur heard. Pulmonary/Chest: Effort normal and breath sounds normal. No  respiratory distress. Valerie Vazquez has no wheezes. Valerie Vazquez has no rales.  Dentist bibasilar breath sounds. No prolongation or wheezing. No JVD or bruits. No S3 or 4 gallop.  Abdominal: Soft. Bowel sounds are normal. Valerie Vazquez exhibits no distension. There is no tenderness. There is no rebound.  Musculoskeletal: Normal range of motion.  Neurological: Valerie Vazquez is alert and oriented to person, place, and time.  Skin: Skin is warm and dry. No rash noted.  Psychiatric: Valerie Vazquez has a normal mood and affect. Her behavior is normal.    ED Course  Procedures (including critical care time) Labs Review Labs Reviewed  CBC - Abnormal; Notable for the following:    Platelets 402 (*)    All other components within normal limits  URINALYSIS, ROUTINE W REFLEX MICROSCOPIC (NOT AT [facility]) - Abnormal; Notable for the following:    Color, Urine AMBER (*)    Leukocytes, UA TRACE (*)    All other components within normal limits  URINE MICROSCOPIC-ADD ON - Abnormal; Notable for the following:    Squamous Epithelial / LPF 0-5 (*)    Bacteria, UA RARE (*)    Casts HYALINE CASTS (*)    All other components within normal limits  BASIC METABOLIC PANEL  BRAIN NATRIURETIC PEPTIDE  I-STAT TROPOININ, ED    Imaging Review Dg Chest 2 View  07/19/2015  CLINICAL DATA:  Acute onset of mid chest pain and shortness of breath. Dizziness and dry cough. Initial encounter. EXAM: CHEST  2 VIEW COMPARISON:  Chest radiograph performed 05/24/2015 FINDINGS: The lungs are well-aerated. Minimal bibasilar opacity may reflect atelectasis or possibly mild pneumonia. Small bilateral pleural effusions are suspected. There is no evidence of pneumothorax. The heart is borderline normal in size. No acute osseous abnormalities are seen. Cervical spinal fusion hardware is noted. An IVC filter is seen. Clips are noted within the right upper quadrant, reflecting prior cholecystectomy. IMPRESSION: Minimal bibasilar opacity may reflect atelectasis or possibly mild pneumonia.  Small bilateral pleural effusions suspected. Electronically Signed   By: Garald Balding M.D.   On: 07/19/2015 19:01   I have personally reviewed and evaluated these images and lab results as part of my medical decision-making.   EKG Interpretation   Date/Time:  Thursday July 19 2015 17:36:03 EST Ventricular Rate:  85 PR Interval:  148 QRS Duration: 80 QT Interval:  380 QTC Calculation: 452 R Axis:   40 Text Interpretation:  Normal sinus rhythm Biatrial enlargement Nonspecific  ST and T wave abnormality   Noted 07/14/2015 Abnormal ECG Confirmed by  Jeneen Rinks  MD, [place] ([id number]) on 07/19/2015 6:28:15 PM      MDM   Final diagnoses:  Dyspnea  Pleural effusion    Does not appear clinically, radiographically, or prolapse and congestive heart failure. No pneumonia. Well oxygenated. Comfortable. I refilled her Lasix. Astra take a daily dose. This may help her with her small pleural effusions, it may not. I discussed this with her at length. Valerie Vazquez has worsening symptoms/recheck here.    Tanna Furry, MD 07/19/15 [phone]

## 2015-07-19 NOTE — Discharge Instructions (Signed)
Pleural Effusion °A pleural effusion is an abnormal buildup of fluid in the layers of tissue between your lungs and the inside of your chest (pleural space). These two layers of tissue that line both your lungs and the inside of your chest are called pleura. Usually, there is no air in the space between the pleura, only a thin layer of fluid. If left untreated, a large amount of fluid can build up and cause the lung to collapse. A pleural effusion is usually caused by another disease that requires treatment. °The two main types of pleural effusion are: °· Transudative pleural effusion. This happens when fluid leaks into the pleural space because of a low protein count in your blood or high blood pressure in your vessels. Heart failure often causes this. °· Exudative infusion. This occurs when fluid collects in the pleural space from blocked blood vessels or lymph vessels. Some lung diseases, injuries, and cancers can cause this type of effusion. °CAUSES °Pleural effusion can be caused by: °· Heart failure. °· A blood clot in the lung (pulmonary embolism). °· Pneumonia. °· Cancer. °· Liver failure (cirrhosis). °· Kidney disease. °· Complications from surgery, such as from open heart surgery. °SIGNS AND SYMPTOMS °In some cases, pleural effusion may cause no symptoms. Symptoms can include: °· Shortness of breath, especially when lying down. °· Chest pain, often worse when taking a deep breath. °· Fever. °· Dry cough that is lasting (chronic). °· Hiccups. °· Rapid breathing. °An underlying condition that is causing the pleural effusion (such as heart failure, pneumonia, blood clots, tuberculosis, or cancer) may also cause additional symptoms. °DIAGNOSIS °Your health care provider may suspect pleural effusion based on your symptoms and medical history. Your health care provider will also do a physical exam and a chest X-ray. If the X-ray shows there is fluid in your chest, you may need to have this fluid removed using a  needle (thoracentesis) so it can be tested. °You may also have: °· Imaging studies of the chest, such as: °¨ Ultrasound. °¨ CT scan. °· Blood tests for kidney and liver function. °TREATMENT °Treatment depends on the cause of the pleural effusion. Treatment may include: °· Taking antibiotic medicines to clear up an infection that is causing the pleural effusion. °· Placing a tube in the chest to drain the effusion (tube thoracostomy). This procedure is often used when there is an infection in the fluid. °· Surgery to remove the fibrous outer layer of tissue from the pleural space (decortication). °· Thoracentesis, which can improve cough and shortness of breath. °· A procedure to put medicine into the chest cavity to seal the pleural space to prevent fluid buildup (pleurodesis). °· Chemotherapy and radiation therapy. These may be required in the case of cancerous (malignant) pleural effusion. °HOME CARE INSTRUCTIONS °· Take medicines only as directed by your health care provider. °· Keep track of how long you can gently exercise before you get short of breath. Try simply walking at first. °· Do not use any tobacco products, including cigarettes, chewing tobacco, or electronic cigarettes. If you need help quitting, ask your health care provider. °· Keep all follow-up visits as directed by your health care provider. This is important. °SEEK MEDICAL CARE IF: °· The amount of time that you are able to exercise decreases or does not improve with time. °· You have pain or signs of infection at the puncture site if you had thoracentesis. Watch for: °¨ Drainage. °¨ Redness. °¨ Swelling. °· You have a fever. °  SEEK IMMEDIATE MEDICAL CARE IF: °· You are short of breath. °· You develop chest pain. °· You develop a new cough. °MAKE SURE YOU: °· Understand these instructions. °· Will watch your condition. °· Will get help right away if you are not doing well or get worse. °  °This information is not intended to replace advice  given to you by your health care provider. Make sure you discuss any questions you have with your health care provider. °  °Document Released: 08/18/2005 Document Revised: 09/08/2014 Document Reviewed: 01/11/2014 °Elsevier Interactive Patient Education ©2016 [employer] ° °

## 2015-07-19 NOTE — ED Notes (Signed)
Pt here with c/o SOB, dizziness, lightheadedness and nausea that started yesterday. Denies CP.

## 2015-07-30 ENCOUNTER — Other Ambulatory Visit: Payer: Self-pay | Admitting: Family Medicine

## 2015-07-30 NOTE — Telephone Encounter (Signed)
Appointment made with Dr Nadine Counts for tomorrow to discuss other issues

## 2015-07-31 ENCOUNTER — Ambulatory Visit (INDEPENDENT_AMBULATORY_CARE_PROVIDER_SITE_OTHER): Payer: Medicare Other | Admitting: Family Medicine

## 2015-07-31 ENCOUNTER — Encounter: Payer: Self-pay | Admitting: Family Medicine

## 2015-07-31 VITALS — BP 102/60 | HR 101 | Temp 99.1°F | Resp 18 | Wt 206.5 lb

## 2015-07-31 DIAGNOSIS — J90 Pleural effusion, not elsewhere classified: Secondary | ICD-10-CM | POA: Insufficient documentation

## 2015-07-31 DIAGNOSIS — I201 Angina pectoris with documented spasm: Secondary | ICD-10-CM

## 2015-07-31 DIAGNOSIS — R05 Cough: Secondary | ICD-10-CM | POA: Diagnosis not present

## 2015-07-31 DIAGNOSIS — R0602 Shortness of breath: Secondary | ICD-10-CM

## 2015-07-31 DIAGNOSIS — R059 Cough, unspecified: Secondary | ICD-10-CM

## 2015-07-31 HISTORY — DX: Shortness of breath: R06.02

## 2015-07-31 HISTORY — DX: Pleural effusion, not elsewhere classified: J90

## 2015-07-31 MED ORDER — LEVOFLOXACIN 750 MG PO TABS: 750.0000 mg | ORAL_TABLET | Freq: Every day | ORAL | Status: DC

## 2015-07-31 MED ORDER — FLUTICASONE-SALMETEROL 250-50 MCG/DOSE IN AEPB: 1.0000 | INHALATION_SPRAY | Freq: Two times a day (BID) | RESPIRATORY_TRACT | Status: DC

## 2015-07-31 MED ORDER — HYDROCOD POLST-CPM POLST ER 10-8 MG/5ML PO SUER: 5.0000 mL | Freq: Two times a day (BID) | ORAL | Status: DC | PRN

## 2015-07-31 NOTE — Patient Instructions (Signed)
CALL DR. Allie Dimmer OFFICE AFTER FINISHING ANTIBIOTICS FOR CHEST X-RAY ORDER

## 2015-07-31 NOTE — Progress Notes (Signed)
Name: Valerie Vazquez   MRN: [id number]    DOB: 12-Oct-1955   Date:07/31/2015       Progress Note  Subjective  Chief Complaint  Chief Complaint  Patient presents with  . Hospitalization Follow-up    patient recently had a x-ray and it showed some mild plueral effusion and possible mild pneumonia    HPI  Valerie Vazquez is a 59 y.o. female with a PMHx of HTN, multiple pulmonary embolisms, hyperlipidemia, MI, DM, and GERD who is here for follow up after being seen in the ER. She presented to the ER for evaluation of shortness of breath on 07/07/15 and 07/13/15. Describes a heaviness in her chest when she leans over forward or lays down at night. Does not have exertional pain or exertional dyspnea. Not syncopal or lightheaded. Yes cough but no sputum production.  BNP normal, no recent DDimer, X-ray done at the ER shows:  FINDINGS: The lungs are well-aerated. Minimal bibasilar opacity may reflect atelectasis or possibly mild pneumonia. Small bilateral pleural effusions are suspected. There is no evidence of pneumothorax.  The heart is borderline normal in size. No acute osseous abnormalities are seen. Cervical spinal fusion hardware is noted. An IVC filter is seen. Clips are noted within the right upper quadrant, reflecting prior cholecystectomy.  IMPRESSION: Minimal bibasilar opacity may reflect atelectasis or possibly mild pneumonia. Small bilateral pleural effusions suspected.  Patient states no antibiotics given.  Past Medical History  Diagnosis Date  . Spinal stenosis   . Hypertension   . Pulmonary embolism ([place]) 04/2001; 09/19/2014    "after gallbladder OR; "  . High cholesterol   . Myocardial infarction ([place]) 10/2010 X 3    "while hospitalized"  . Pneumonia 04/2014  . Sleep apnea     "mild" (09/19/2014)  . Type II diabetes mellitus ([place])     "dx'd in 2007; lost weight; no RX for ~ 4 yr now" (09/19/2014)  . Anemia   . History of blood transfusion 1985     related to hysterectomy  . Esophageal spasm   . Gastritis   . Gastroenteritis   . IBS (irritable bowel syndrome)   . GERD (gastroesophageal reflux disease)   . Headache     "couple /month lately" (09/19/2014)  . Migraine     "a few times/yr" (09/19/2014)  . Osteoarthritis     "qwhere; mostly around my joints" (09/19/2014)  . Spondylosis   . Chronic back pain     "upper and lower" (09/19/2014)  . Depression     "major" (09/19/2014)  . PTSD (post-traumatic stress disorder)   . Schizoaffective disorder ([place])   . Snoring     a. sleep study 5/16: No OSA    Patient Active Problem List   Diagnosis Date Noted  . Chronic pain of multiple sites 06/05/2015  . Chronic lumbosacral pain 06/05/2015  . Esophageal spasm 06/05/2015  . History of DVT (deep vein thrombosis) 02/25/2015  . 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 Constipation 12/12/2014  . Coronary vasospasm ([place]) 12/09/2014  . History of pulmonary embolus (PE) 09/19/2014  . Impingement syndrome of shoulder 08/30/2014  . Injury of superior glenoid labrum of shoulder joint 08/16/2014  . Supraspinatus tenosynovitis 08/16/2014  . Cervical spondylosis without myelopathy 08/16/2014  . Anxiety and depression 04/13/2014  . Osteoarthritis of left hip 04/11/2014  . Status post left hip replacement 04/11/2014  . IBS (irritable bowel syndrome) 04/11/2014  . GERD (gastroesophageal reflux disease) 04/11/2014  . Unspecified constipation 04/11/2014  . Insomnia 04/11/2014  .  Essential hypertension, benign 10/28/2013  . Left hip pain 10/28/2013  . Anemia 10/28/2013  . HLD (hyperlipidemia) 10/28/2013    Social History  Substance Use Topics  . Smoking status: Former Smoker -- 1.50 packs/day for 10 years    Types: Cigarettes    Quit date: 04/12/1979  . Smokeless tobacco: Never Used  . Alcohol Use: 0.0 oz/week    0 Standard drinks or equivalent per week     Comment: 09/19/2014 "nothing in the last 6 months"     Current outpatient prescriptions:   .  apixaban (ELIQUIS) 5 MG TABS tablet, Take 1 tablet (5 mg total) by mouth 2 (two) times daily., Disp: 60 tablet, Rfl: 3 .  BIOTIN PO, Take 1 tablet by mouth daily., Disp: , Rfl:  .  calcium carbonate (TUMS - DOSED IN MG ELEMENTAL CALCIUM) 500 MG chewable tablet, Chew 1 tablet by mouth daily as needed for indigestion or heartburn., Disp: , Rfl:  .  carvedilol (COREG) 3.125 MG tablet, Take 1 tablet (3.125 mg total) by mouth 2 (two) times daily with a meal., Disp: 180 tablet, Rfl: 3 .  cyclobenzaprine (FLEXERIL) 10 MG tablet, TAKE 1 TABLET (10 MG TOTAL) BY MOUTH EVERY 8 (EIGHT) HOURS., Disp: 30 tablet, Rfl: 0 .  dicyclomine (BENTYL) 10 MG capsule, TAKE 2 CAPSULES BY MOUTH EVERY SIX HOURS, Disp: 240 capsule, Rfl: 5 .  diltiazem (CARDIZEM CD) 120 MG 24 hr capsule, TAKE ONE CAPSULE BY MOUTH EVERY DAY., Disp: 30 capsule, Rfl: 0 .  diphenhydrAMINE (BENADRYL) 25 MG tablet, Take 1-2 tablets (25-50 mg total) by mouth every 6 (six) hours as needed for itching (Rash)., Disp: 30 tablet, Rfl: 0 .  escitalopram (LEXAPRO) 10 MG tablet, TAKE 1 TABLET BY MOUTH EVERY DAY, Disp: 30 tablet, Rfl: 5 .  famotidine (PEPCID) 20 MG tablet, Take 1 tablet (20 mg total) by mouth 2 (two) times daily. Take for itching, Disp: 10 tablet, Rfl: 0 .  furosemide (LASIX) 20 MG tablet, Take 1 tablet (20 mg total) by mouth daily as needed for fluid., Disp: 10 tablet, Rfl: 0 .  gabapentin (NEURONTIN) 600 MG tablet, Take 1 tablet (600 mg total) by mouth every 8 (eight) hours., Disp: 90 tablet, Rfl: 5 .  hydrOXYzine (ATARAX/VISTARIL) 25 MG tablet, Take 1 tablet (25 mg total) by mouth every 6 (six) hours as needed for itching., Disp: 12 tablet, Rfl: 0 .  isosorbide mononitrate (IMDUR) 30 MG 24 hr tablet, Take 1 tablet (30 mg total) by mouth daily., Disp: 30 tablet, Rfl: 11 .  Multiple Vitamin (MULTIVITAMIN WITH MINERALS) TABS tablet, Take 2 tablets by mouth daily. , Disp: , Rfl:  .  NIFEdipine (PROCARDIA-XL/ADALAT-CC/NIFEDICAL-XL) 30 MG 24 hr  tablet, Take 30 mg by mouth daily. , Disp: , Rfl:  .  ondansetron (ZOFRAN) 4 MG tablet, Take 1 tablet (4 mg total) by mouth every 8 (eight) hours as needed for nausea or vomiting., Disp: 30 tablet, Rfl: 3 .  oxyCODONE-acetaminophen (PERCOCET) 10-325 MG tablet, Take 1 tablet by mouth every 6 (six) hours as needed for pain., Disp: 120 tablet, Rfl: 0 .  pantoprazole (PROTONIX) 40 MG tablet, TAKE 1 TABLET BY MOUTH EVERY DAY, Disp: 30 tablet, Rfl: 5 .  potassium chloride (K-DUR) 10 MEQ tablet, Take 1 tablet (10 mEq total) by mouth 2 (two) times daily., Disp: 10 tablet, Rfl: 0 .  potassium chloride SA (K-DUR,KLOR-CON) 20 MEQ tablet, Take 20 mEq by mouth daily as needed. Take along with lasix for potassium replacement, Disp: , Rfl:  .  Sennosides (EX-LAX) 15 MG CHEW, Chew 1 tablet by mouth daily as needed. For constipation, Disp: , Rfl:  .  traZODone (DESYREL) 50 MG tablet, Take 1 tablet (50 mg total) by mouth at bedtime. (Patient taking differently: Take 50 mg by mouth at bedtime as needed for sleep. ), Disp: 30 tablet, Rfl: 5  Past Surgical History  Procedure Laterality Date  . Excision/release bursa hip Right 12/1984  . Total hip arthroplasty Left 04-06-2014  . Tonsillectomy and adenoidectomy  ~ 1968  . Appendectomy  1985  . Abdominal hysterectomy  1985  . Cholecystectomy  2002  . Hernia repair  1985  . Umbilical hernia repair  1985  . Vena cava filter placement  03/2014  . Back surgery    . Anterior cervical decomp/discectomy fusion  10/2012  . Breast cyst excision Right   . Cardiac catheterization   2000's; 2009  . Left heart catheterization with coronary angiogram N/A 12/09/2014    Procedure: LEFT HEART CATHETERIZATION WITH CORONARY ANGIOGRAM;  Surgeon: Burnell Blanks, MD;  Location: [facility] CATH LAB;  Service: Cardiovascular;  Laterality: N/A;    Family History  Problem Relation Age of Onset  . Heart attack Neg Hx   . Stroke Mother     Allergies  Allergen Reactions  . Abilify  [Aripiprazole] Other (See Comments)    Jerking movement, slow motor skills  . Augmentin [Amoxicillin-Pot Clavulanate] Diarrhea and Nausea And Vomiting  . Bactrim [Sulfamethoxazole-Trimethoprim] Diarrhea  . Ceftin [Cefuroxime Axetil] Diarrhea  . Diclofenac Other (See Comments)     Muscle Pain  . Indocin [Indomethacin] Other (See Comments)    Migraines   . Linaclotide Diarrhea  . Lisinopril Diarrhea and Other (See Comments)    Other reaction(s): Dizziness, Vomiting  . Morphine Other (See Comments)    Chest Tightness  . Morphine And Related Other (See Comments)    Chest tightness   . Simvastatin Other (See Comments)    Muscle Pain  . Sulfa Antibiotics Diarrhea  . Sulfamethoxazole-Trimethoprim Diarrhea  . Wellbutrin [Bupropion] Other (See Comments)    Jerking movement Slurred speech  . Quetiapine Fumarate Palpitations     Review of Systems  CONSTITUTIONAL: No significant weight changes, fever. Yes chills and weakness. HEENT:  - Eyes: No visual changes.  - Ears: No auditory changes. No pain.  - Nose: No sneezing, congestion, runny nose. - Throat: No sore throat. No changes in swallowing. SKIN: No rash or itching.  CARDIOVASCULAR: No chest pain, chest pressure or chest discomfort. No palpitations or edema.  RESPIRATORY: Yes shortness of breath. GASTROINTESTINAL: No anorexia, nausea, vomiting. No changes in bowel habits. No abdominal pain or blood.  MUSCULOSKELETAL: Chronic joint pain. No muscle pain. PSYCHIATRIC: No change in mood. No change in sleep pattern.  ENDOCRINOLOGIC: No reports of sweating, cold or heat intolerance. No polyuria or polydipsia.     Objective  Pulse 101  Temp(Src) 99.1 F (37.3 C) (Oral)  Resp 16  Wt 206 lb 8 oz (93.668 kg)  SpO2 97% Body mass index is 33.35 kg/(m^2).  Physical Exam  Constitutional: Patient appears overweight and well-nourished. In no distress.   Cardiovascular: Normal rate, regular rhythm and normal heart sounds.  No murmur  heard.  Pulmonary/Chest: Effort normal and breath sounds muffled right lower lung base posteriorly. No respiratory distress. Neurological: CN II-XII grossly intact with no focal deficits. Alert and oriented to person, place, and time. Coordination, balance, strength, speech and gait are normal.  Skin: Skin is warm and dry. No rash noted.  No erythema.  Psychiatric: Patient has a normal mood and affect. Behavior is normal in office today. Judgment and thought content normal in office today.   Recent Results (from the past 2160 hour(s))  I-stat troponin, ED     Status: None   Collection Time: 05/24/15  8:58 PM  Result Value Ref Range   Troponin i, poc 0.00 0.00 - 0.08 ng/mL   Comment 3            Comment: Due to the release kinetics of cTnI, a negative result within the first hours of the onset of symptoms does not rule out myocardial infarction with certainty. If myocardial infarction is still suspected, repeat the test at appropriate intervals.   Basic metabolic panel     Status: None   Collection Time: 05/24/15  9:05 PM  Result Value Ref Range   Sodium 140 135 - 145 mmol/L   Potassium 3.8 3.5 - 5.1 mmol/L   Chloride 108 101 - 111 mmol/L   CO2 26 22 - 32 mmol/L   Glucose, Bld 94 65 - 99 mg/dL   BUN 8 6 - 20 mg/dL   Creatinine, Ser 0.81 0.44 - 1.00 mg/dL   Calcium 9.1 8.9 - 10.3 mg/dL   GFR calc non Af Amer >60 >60 mL/min   GFR calc Af Amer >60 >60 mL/min    Comment: (NOTE) The eGFR has been calculated using the CKD EPI equation. This calculation has not been validated in all clinical situations. eGFR's persistently <60 mL/min signify possible Chronic Kidney Disease.    Anion gap 6 5 - 15  CBC     Status: Abnormal   Collection Time: 05/24/15  9:05 PM  Result Value Ref Range   WBC 6.0 4.0 - 10.5 K/uL   RBC 3.51 (L) 3.87 - 5.11 MIL/uL   Hemoglobin 10.3 (L) 12.0 - 15.0 g/dL   HCT 32.0 (L) 36.0 - 46.0 %   MCV 91.2 78.0 - 100.0 fL   MCH 29.3 26.0 - 34.0 pg   MCHC 32.2 30.0 -  36.0 g/dL   RDW 14.7 11.5 - 15.5 %   Platelets 306 150 - 400 K/uL  Protime-INR - (order if Patient is taking Coumadin / Warfarin)     Status: None   Collection Time: 05/24/15  9:05 PM  Result Value Ref Range   Prothrombin Time 14.6 11.6 - 15.2 seconds   INR 1.12 0.00 - 1.49  I-stat troponin, ED     Status: None   Collection Time: 05/25/15  3:56 AM  Result Value Ref Range   Troponin i, poc 0.00 0.00 - 0.08 ng/mL   Comment 3            Comment: Due to the release kinetics of cTnI, a negative result within the first hours of the onset of symptoms does not rule out myocardial infarction with certainty. If myocardial infarction is still suspected, repeat the test at appropriate intervals.   Basic metabolic panel     Status: None   Collection Time: 07/19/15  6:30 PM  Result Value Ref Range   Sodium 139 135 - 145 mmol/L   Potassium 4.0 3.5 - 5.1 mmol/L   Chloride 104 101 - 111 mmol/L   CO2 27 22 - 32 mmol/L   Glucose, Bld 98 65 - 99 mg/dL   BUN 7 6 - 20 mg/dL   Creatinine, Ser 0.80 0.44 - 1.00 mg/dL   Calcium 9.4 8.9 - 10.3 mg/dL   GFR calc non  Af Amer >60 >60 mL/min   GFR calc Af Amer >60 >60 mL/min    Comment: (NOTE) The eGFR has been calculated using the CKD EPI equation. This calculation has not been validated in all clinical situations. eGFR's persistently <60 mL/min signify possible Chronic Kidney Disease.    Anion gap 8 5 - 15  CBC     Status: Abnormal   Collection Time: 07/19/15  6:30 PM  Result Value Ref Range   WBC 7.2 4.0 - 10.5 K/uL   RBC 4.17 3.87 - 5.11 MIL/uL   Hemoglobin 12.1 12.0 - 15.0 g/dL   HCT 37.8 36.0 - 46.0 %   MCV 90.6 78.0 - 100.0 fL   MCH 29.0 26.0 - 34.0 pg   MCHC 32.0 30.0 - 36.0 g/dL   RDW 14.4 11.5 - 15.5 %   Platelets 402 (H) 150 - 400 K/uL  Brain natriuretic peptide     Status: None   Collection Time: 07/19/15  6:30 PM  Result Value Ref Range   B Natriuretic Peptide 19.8 0.0 - 100.0 pg/mL  I-Stat Troponin, ED (not at [facility])     Status:  None   Collection Time: 07/19/15  6:45 PM  Result Value Ref Range   Troponin i, poc 0.00 0.00 - 0.08 ng/mL   Comment 3            Comment: Due to the release kinetics of cTnI, a negative result within the first hours of the onset of symptoms does not rule out myocardial infarction with certainty. If myocardial infarction is still suspected, repeat the test at appropriate intervals.   Urinalysis, Routine w reflex microscopic (not at [facility])     Status: Abnormal   Collection Time: 07/19/15  8:20 PM  Result Value Ref Range   Color, Urine AMBER (A) YELLOW    Comment: BIOCHEMICALS MAY BE AFFECTED BY COLOR   APPearance CLEAR CLEAR   Specific Gravity, Urine 1.028 1.005 - 1.030   pH 5.0 5.0 - 8.0   Glucose, UA NEGATIVE NEGATIVE mg/dL   Hgb urine dipstick NEGATIVE NEGATIVE   Bilirubin Urine NEGATIVE NEGATIVE   Ketones, ur NEGATIVE NEGATIVE mg/dL   Protein, ur NEGATIVE NEGATIVE mg/dL   Nitrite NEGATIVE NEGATIVE   Leukocytes, UA TRACE (A) NEGATIVE  Urine microscopic-add on     Status: Abnormal   Collection Time: 07/19/15  8:20 PM  Result Value Ref Range   Squamous Epithelial / LPF 0-5 (A) NONE SEEN    Comment: Please note change in reference range.   WBC, UA 0-5 0 - 5 WBC/hpf    Comment: Please note change in reference range.   RBC / HPF NONE SEEN 0 - 5 RBC/hpf    Comment: Please note change in reference range.   Bacteria, UA RARE (A) NONE SEEN    Comment: Please note change in reference range.   Casts HYALINE CASTS (A) NEGATIVE   Urine-Other MUCOUS PRESENT      Assessment & Plan  1. Shortness of breath Reviewed ER finding and notes. Will start patient on Levofloxacin for 10 days and repeat CXR after that. If no resolution I have already recommended consultation with Pulmonologist.  - levofloxacin (LEVAQUIN) 750 MG tablet; Take 1 tablet (750 mg total) by mouth daily.  Dispense: 10 tablet; Refill: 0 - chlorpheniramine-HYDROcodone (TUSSIONEX PENNKINETIC ER) 10-8 MG/5ML SUER; Take 5  mLs by mouth every 12 (twelve) hours as needed for cough.  Dispense: 115 mL; Refill: 0 - Fluticasone-Salmeterol (ADVAIR DISKUS) 250-50 MCG/DOSE AEPB;  Inhale 1 puff into the lungs 2 (two) times daily.  Dispense: 60 each; Refill: 0  2. Pleural effusion, bilateral  - levofloxacin (LEVAQUIN) 750 MG tablet; Take 1 tablet (750 mg total) by mouth daily.  Dispense: 10 tablet; Refill: 0 - chlorpheniramine-HYDROcodone (TUSSIONEX PENNKINETIC ER) 10-8 MG/5ML SUER; Take 5 mLs by mouth every 12 (twelve) hours as needed for cough.  Dispense: 115 mL; Refill: 0 - Fluticasone-Salmeterol (ADVAIR DISKUS) 250-50 MCG/DOSE AEPB; Inhale 1 puff into the lungs 2 (two) times daily.  Dispense: 60 each; Refill: 0  3. Cough  - levofloxacin (LEVAQUIN) 750 MG tablet; Take 1 tablet (750 mg total) by mouth daily.  Dispense: 10 tablet; Refill: 0 - chlorpheniramine-HYDROcodone (TUSSIONEX PENNKINETIC ER) 10-8 MG/5ML SUER; Take 5 mLs by mouth every 12 (twelve) hours as needed for cough.  Dispense: 115 mL; Refill: 0 - Fluticasone-Salmeterol (ADVAIR DISKUS) 250-50 MCG/DOSE AEPB; Inhale 1 puff into the lungs 2 (two) times daily.  Dispense: 60 each; Refill: 0

## 2015-08-02 IMAGING — CR DG CHEST 2V
2 series · 2 of 2 positions shown · non-contrast
Comparison: None.

CLINICAL DATA: Chest pain.  Hypertension.  Diabetes

EXAM:
CHEST  2 VIEW

[w chest pa]
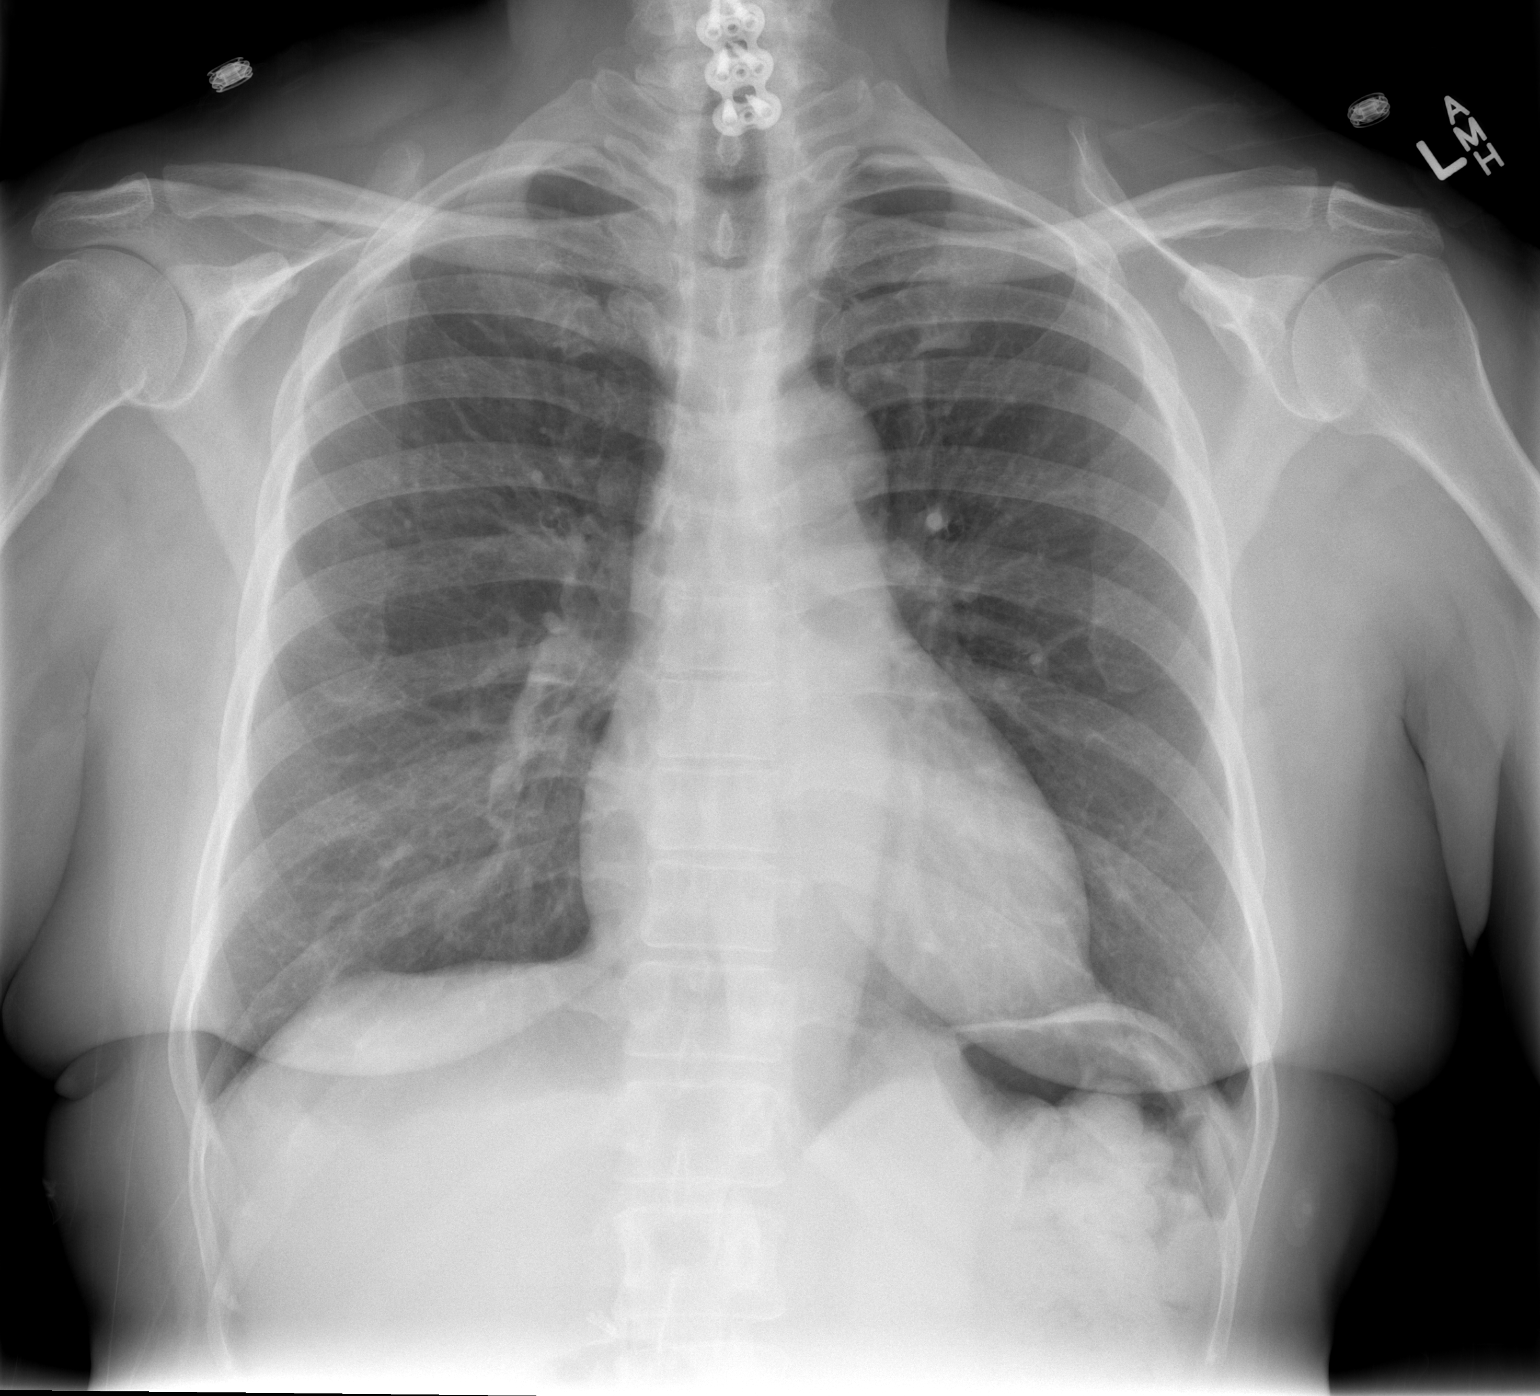

[w chest lat]
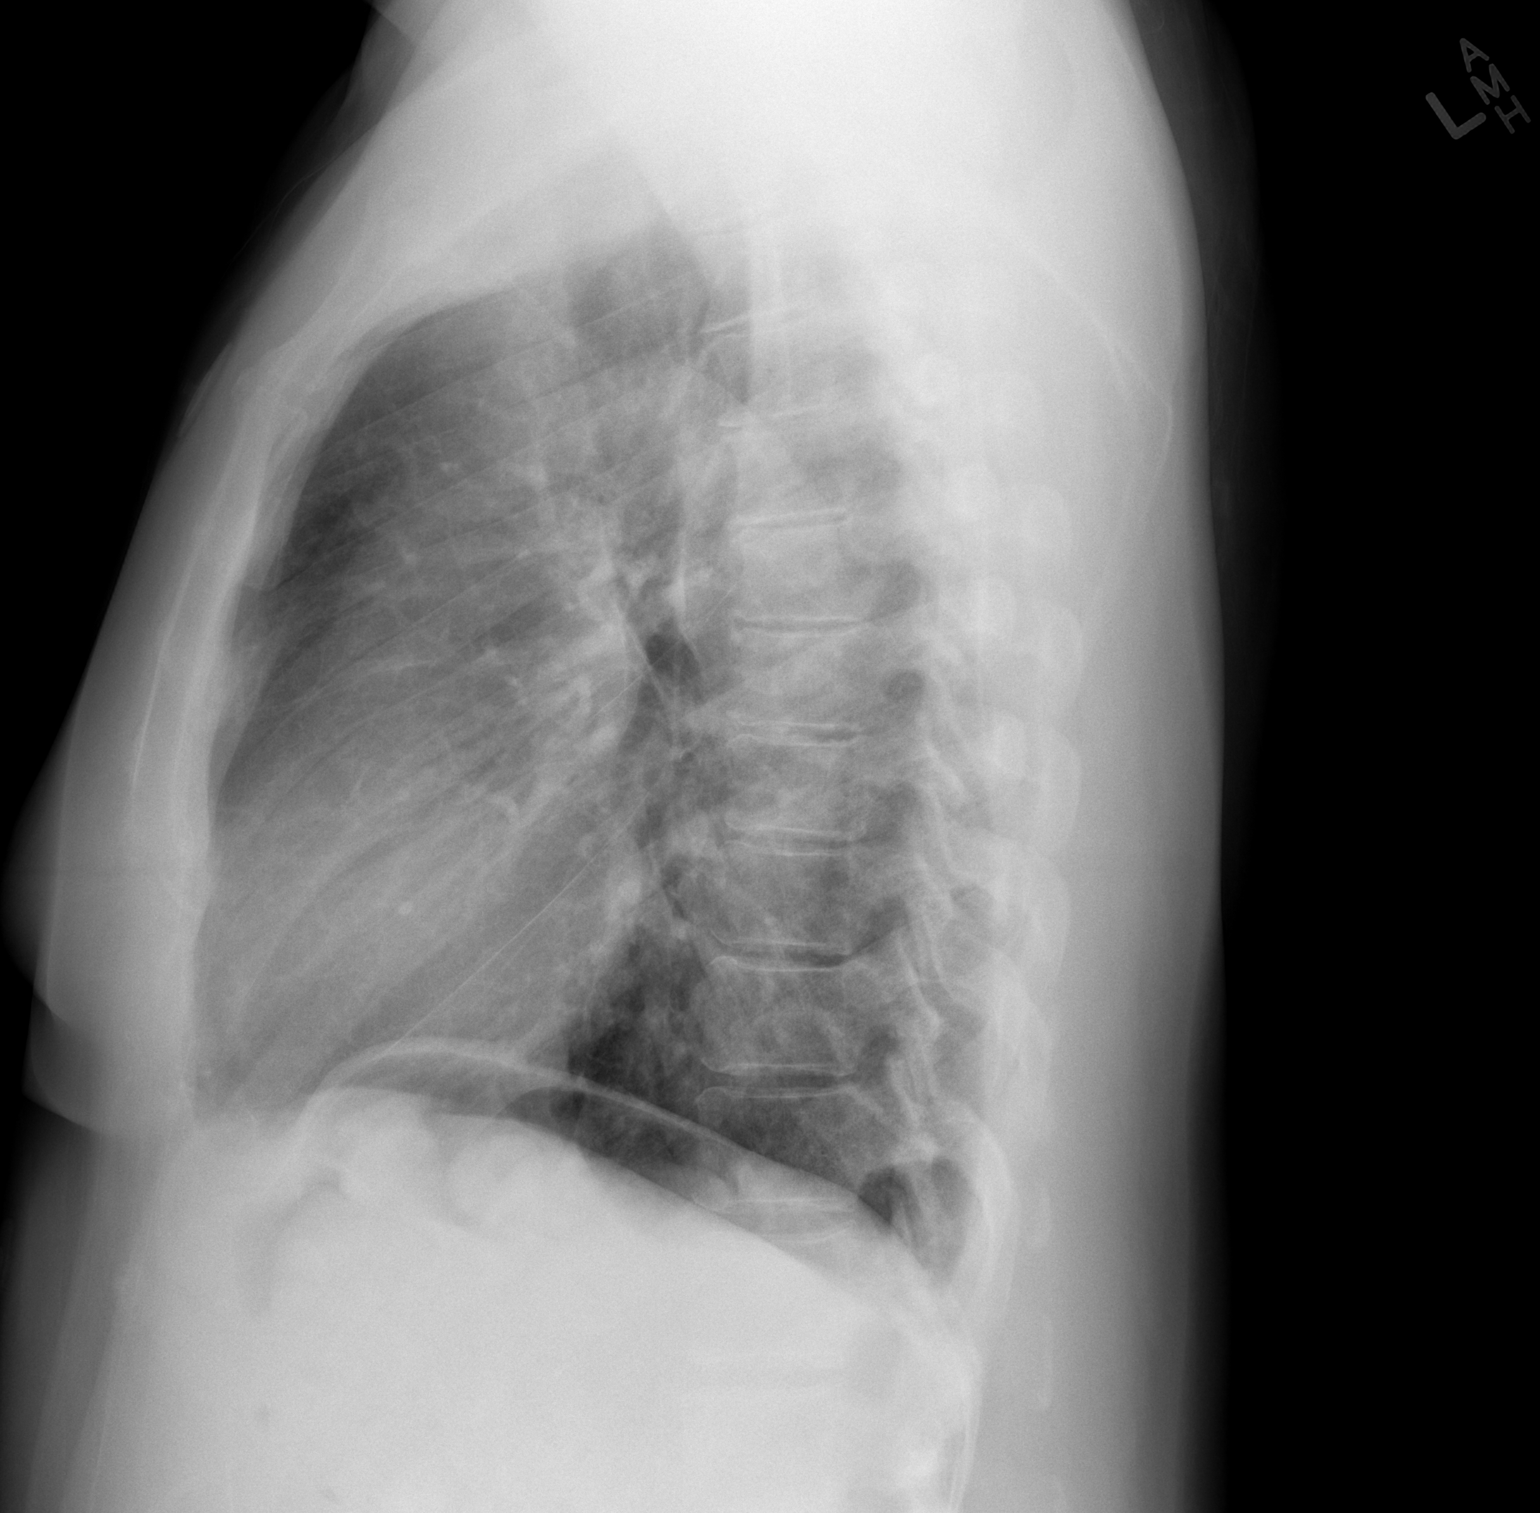

[2 of 2 positions shown; findings below may reference images not displayed]

FINDINGS: The heart size and mediastinal contours are within normal limits.
Both lungs are clear. Fusion hardware noted in the lower cervical
spine.
IMPRESSION: No active cardiopulmonary disease.

## 2015-08-03 ENCOUNTER — Other Ambulatory Visit: Payer: Self-pay | Admitting: Family Medicine

## 2015-08-03 NOTE — Telephone Encounter (Signed)
Pt states she needs refill on Percocet.

## 2015-08-03 NOTE — Telephone Encounter (Signed)
Refill request was sent to Dr. Ashany Sundaram for approval and submission.  

## 2015-08-06 MED ORDER — OXYCODONE-ACETAMINOPHEN 10-325 MG PO TABS: 1.0000 | ORAL_TABLET | Freq: Four times a day (QID) | ORAL | Status: DC | PRN

## 2015-08-10 ENCOUNTER — Telehealth: Payer: Self-pay | Admitting: Family Medicine

## 2015-08-10 NOTE — Telephone Encounter (Signed)
Would like to know why you changed the dosage of her Flexeril to 30 pills and you have her taking one by mouth every 8hrs. States that the prescription does not last

## 2015-08-13 ENCOUNTER — Other Ambulatory Visit: Payer: Self-pay | Admitting: Family Medicine

## 2015-08-13 MED ORDER — CYCLOBENZAPRINE HCL 10 MG PO TABS: 10.0000 mg | ORAL_TABLET | Freq: Three times a day (TID) | ORAL | Status: DC | PRN

## 2015-08-13 NOTE — Telephone Encounter (Signed)
Sent in new Rx with quantity #100 with refills.

## 2015-08-14 ENCOUNTER — Other Ambulatory Visit: Payer: Self-pay | Admitting: Family Medicine

## 2015-08-15 ENCOUNTER — Ambulatory Visit (INDEPENDENT_AMBULATORY_CARE_PROVIDER_SITE_OTHER): Payer: Medicare Other | Admitting: Family Medicine

## 2015-08-15 ENCOUNTER — Ambulatory Visit
Admission: RE | Admit: 2015-08-15 | Discharge: 2015-08-15 | Disposition: A | Discharge status: Home / Self Care | Payer: Medicare Other | Source: Ambulatory Visit | Attending: Family Medicine | Admitting: Family Medicine

## 2015-08-15 ENCOUNTER — Encounter: Payer: Self-pay | Admitting: Family Medicine

## 2015-08-15 VITALS — BP 128/76 | HR 88 | Temp 98.4°F | Resp 16 | Wt 207.0 lb

## 2015-08-15 DIAGNOSIS — I201 Angina pectoris with documented spasm: Secondary | ICD-10-CM | POA: Diagnosis not present

## 2015-08-15 DIAGNOSIS — I2699 Other pulmonary embolism without acute cor pulmonale: Secondary | ICD-10-CM | POA: Insufficient documentation

## 2015-08-15 DIAGNOSIS — R110 Nausea: Secondary | ICD-10-CM | POA: Diagnosis not present

## 2015-08-15 DIAGNOSIS — J189 Pneumonia, unspecified organism: Secondary | ICD-10-CM

## 2015-08-15 DIAGNOSIS — R0789 Other chest pain: Secondary | ICD-10-CM | POA: Diagnosis not present

## 2015-08-15 DIAGNOSIS — R112 Nausea with vomiting, unspecified: Secondary | ICD-10-CM | POA: Insufficient documentation

## 2015-08-15 IMAGING — CR DG TIBIA/FIBULA 2V*L*
1 series · 3 of 3 positions shown · non-contrast
Comparison: None.

CLINICAL DATA: Left leg pain

EXAM:
LEFT TIBIA AND FIBULA - 2 VIEW

[Series 1: ap · 0.17mm/px · 3 of 3 slices shown]
[im 1/3]
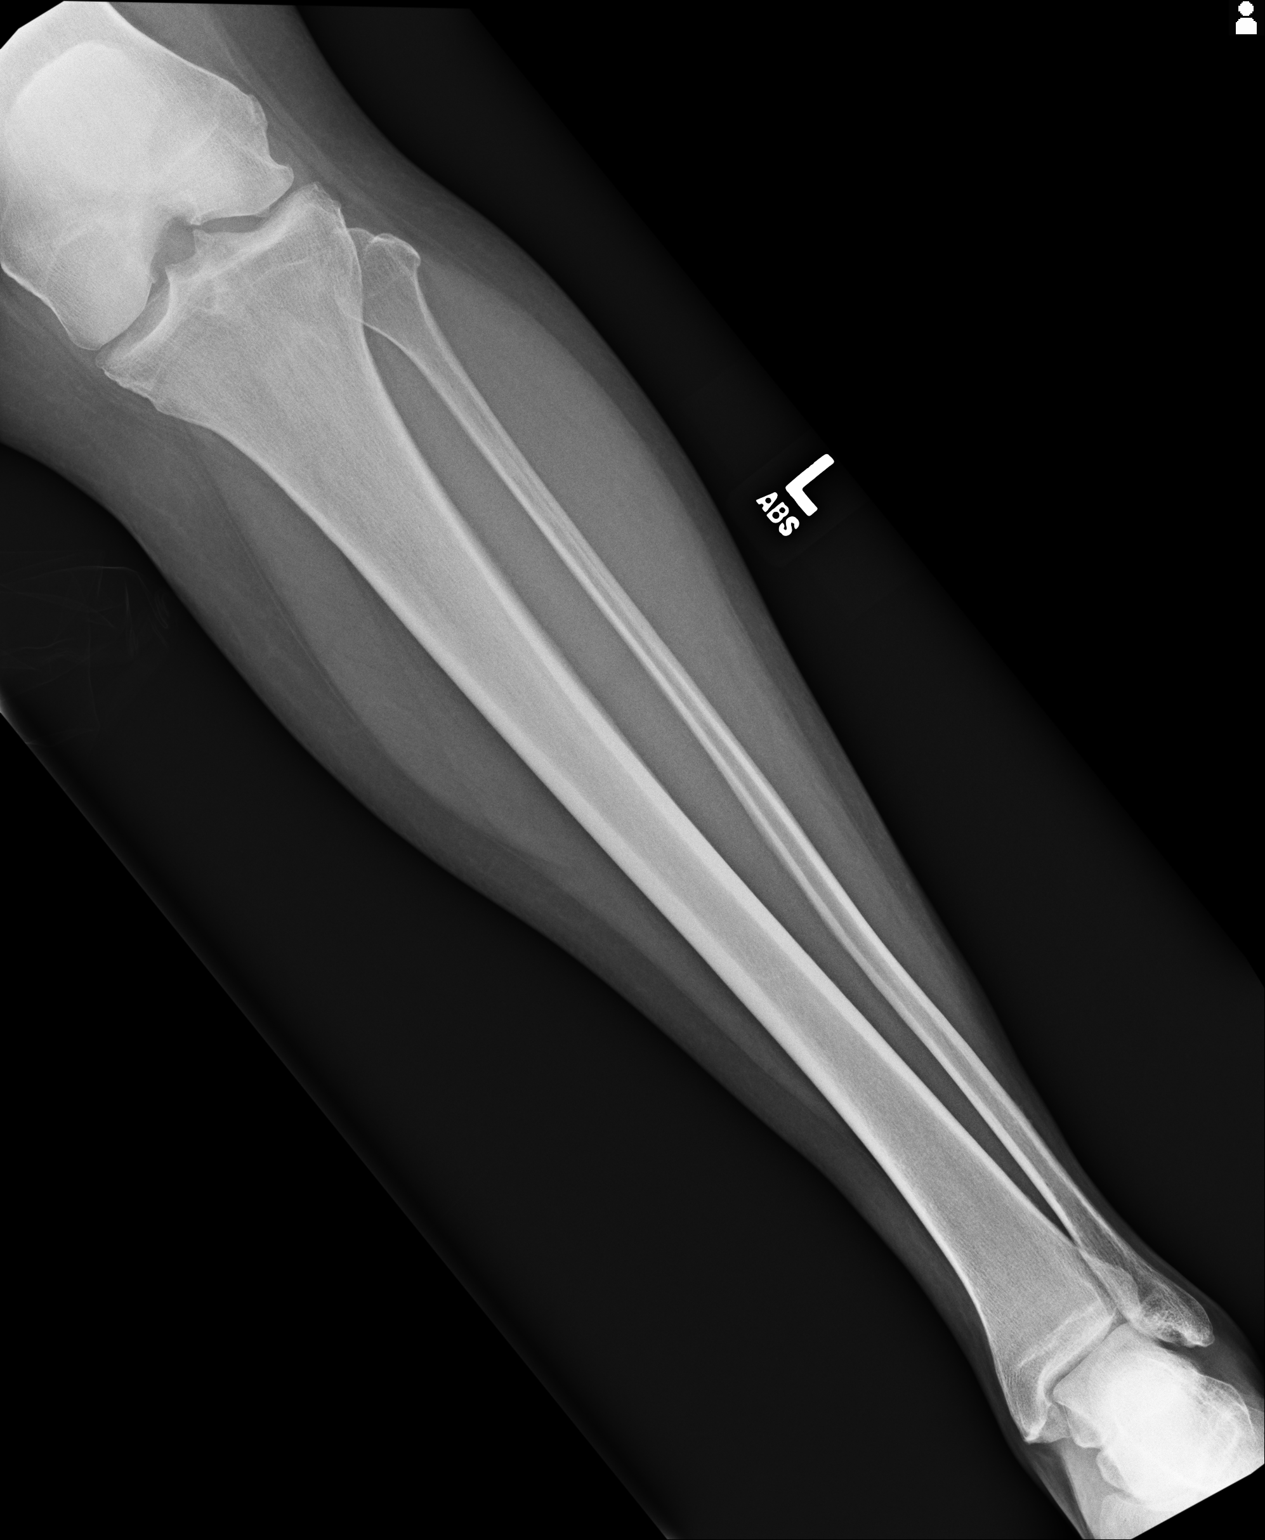
[im 2/3]
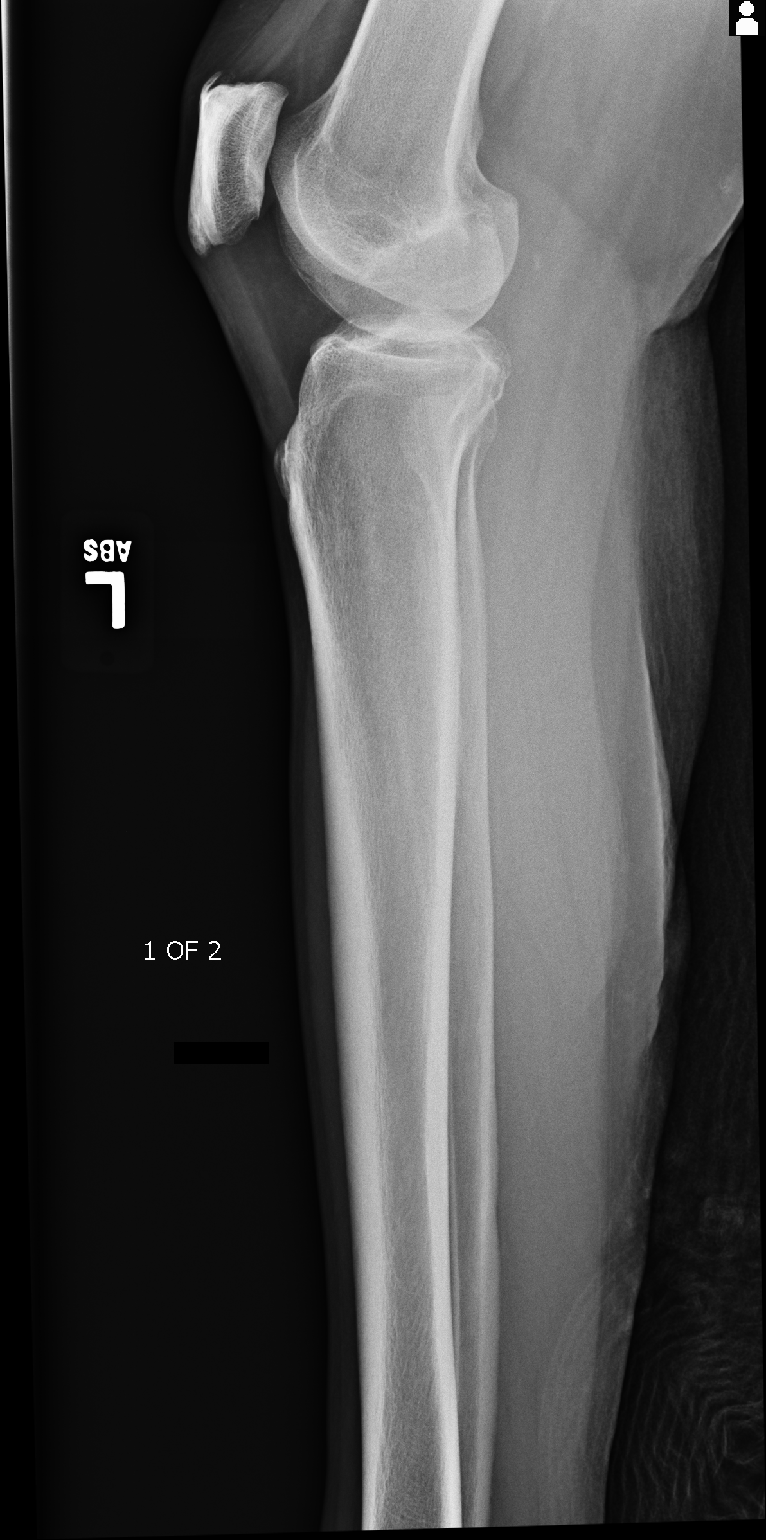
[im 3/3]
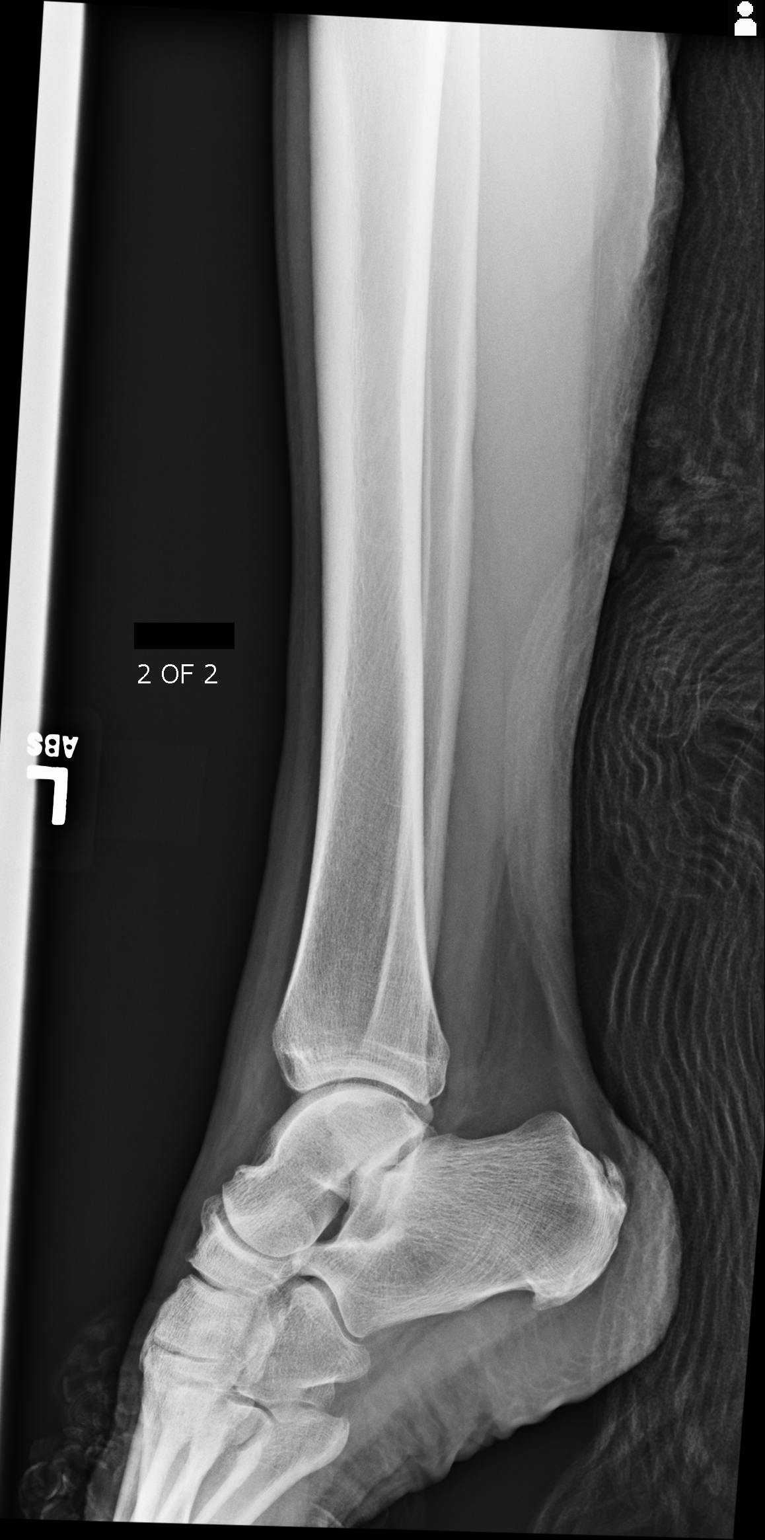

[3 of 3 positions shown; findings below may reference images not displayed]

FINDINGS: There is no evidence of fracture or other focal bone lesions. Soft
tissues are unremarkable.
IMPRESSION: No acute abnormality noted.

## 2015-08-15 MED ORDER — ONDANSETRON HCL 4 MG PO TABS: 4.0000 mg | ORAL_TABLET | Freq: Three times a day (TID) | ORAL | Status: DC | PRN

## 2015-08-15 NOTE — Progress Notes (Signed)
Name: Valerie Vazquez   MRN: [id number]    DOB: 1955-10-19   Date:08/15/2015       Progress Note  Subjective  Chief Complaint  Chief Complaint  Patient presents with  . Follow-up    patient wants to make sure her lungs has cleared up from the pneumonia. patient stated that she is still coughing.    HPI  Valerie Vazquez is a 59 y.o. female with a PMHx of HTN, multiple pulmonary embolisms, hyperlipidemia, MI, DM, and GERD who is here for follow up after being seen in the ER 07/2015 and started on Levaquin by me on 08/30/15 for possible pneumonia. She reports continued heaviness in her chest when she leans over forward or lays down at night. Does not have exertional pain or exertional dyspnea. Not syncopal or lightheaded. Yes cough but no sputum production. Complains of general fatigue and aches all over. Feels like her breathing is not at its best. Has not seen a pulmonologist recently. Needs refill of nausea medication. Chronically nauseous.   Past Medical History  Diagnosis Date  . Spinal stenosis   . Hypertension   . Pulmonary embolism ([place]) 04/2001; 09/19/2014    "after gallbladder OR; "  . High cholesterol   . Myocardial infarction ([place]) 10/2010 X 3    "while hospitalized"  . Pneumonia 04/2014  . Sleep apnea     "mild" (09/19/2014)  . Type II diabetes mellitus ([place])     "dx'd in 2007; lost weight; no RX for ~ 4 yr now" (09/19/2014)  . Anemia   . History of blood transfusion 1985    related to hysterectomy  . Esophageal spasm   . Gastritis   . Gastroenteritis   . IBS (irritable bowel syndrome)   . GERD (gastroesophageal reflux disease)   . Headache     "couple /month lately" (09/19/2014)  . Migraine     "a few times/yr" (09/19/2014)  . Osteoarthritis     "qwhere; mostly around my joints" (09/19/2014)  . Spondylosis   . Chronic back pain     "upper and lower" (09/19/2014)  . Depression     "major" (09/19/2014)  . PTSD (post-traumatic stress disorder)   .  Schizoaffective disorder ([place])   . Snoring     a. sleep study 5/16: No OSA    Patient Active Problem List   Diagnosis Date Noted  . Pulmonary embolism ([place]) 08/15/2015  . Shortness of breath 07/31/2015  . Pleural effusion, bilateral 07/31/2015  . Cough 07/31/2015  . Chronic pain of multiple sites 06/05/2015  . Chronic lumbosacral pain 06/05/2015  . Esophageal spasm 06/05/2015  . History of DVT (deep vein thrombosis) 02/25/2015  . 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 Constipation 12/12/2014  . Coronary vasospasm ([place]) 12/09/2014  . History of pulmonary embolus (PE) 09/19/2014  . Impingement syndrome of shoulder 08/30/2014  . Injury of superior glenoid labrum of shoulder joint 08/16/2014  . Supraspinatus tenosynovitis 08/16/2014  . Cervical spondylosis without myelopathy 08/16/2014  . Anxiety and depression 04/13/2014  . Osteoarthritis of left hip 04/11/2014  . Status post left hip replacement 04/11/2014  . IBS (irritable bowel syndrome) 04/11/2014  . GERD (gastroesophageal reflux disease) 04/11/2014  . Unspecified constipation 04/11/2014  . Insomnia 04/11/2014  . Essential hypertension, benign 10/28/2013  . Left hip pain 10/28/2013  . Anemia 10/28/2013  . HLD (hyperlipidemia) 10/28/2013    Social History  Substance Use Topics  . Smoking status: Former Smoker -- 1.50 packs/day for 10 years    Types:  Cigarettes    Quit date: 04/12/1979  . Smokeless tobacco: Never Used  . Alcohol Use: 0.0 oz/week    0 Standard drinks or equivalent per week     Comment: 09/19/2014 "nothing in the last 6 months"     Current outpatient prescriptions:  .  apixaban (ELIQUIS) 5 MG TABS tablet, Take 1 tablet (5 mg total) by mouth 2 (two) times daily., Disp: 60 tablet, Rfl: 3 .  BIOTIN PO, Take 1 tablet by mouth daily., Disp: , Rfl:  .  calcium carbonate (TUMS - DOSED IN MG ELEMENTAL CALCIUM) 500 MG chewable tablet, Chew 1 tablet by mouth daily as needed for indigestion or heartburn., Disp: , Rfl:  .   carvedilol (COREG) 3.125 MG tablet, Take 1 tablet (3.125 mg total) by mouth 2 (two) times daily with a meal., Disp: 180 tablet, Rfl: 3 .  chlorpheniramine-HYDROcodone (TUSSIONEX PENNKINETIC ER) 10-8 MG/5ML SUER, Take 5 mLs by mouth every 12 (twelve) hours as needed for cough., Disp: 115 mL, Rfl: 0 .  cyclobenzaprine (FLEXERIL) 10 MG tablet, Take 1 tablet (10 mg total) by mouth 3 (three) times daily as needed for muscle spasms., Disp: 100 tablet, Rfl: 5 .  dicyclomine (BENTYL) 10 MG capsule, TAKE 2 CAPSULES BY MOUTH EVERY SIX HOURS, Disp: 240 capsule, Rfl: 5 .  diltiazem (CARDIZEM CD) 120 MG 24 hr capsule, TAKE ONE CAPSULE BY MOUTH EVERY DAY., Disp: 30 capsule, Rfl: 0 .  diphenhydrAMINE (BENADRYL) 25 MG tablet, Take 1-2 tablets (25-50 mg total) by mouth every 6 (six) hours as needed for itching ([place])., Disp: 30 tablet, Rfl: 0 .  escitalopram (LEXAPRO) 10 MG tablet, TAKE 1 TABLET BY MOUTH EVERY DAY, Disp: 30 tablet, Rfl: 5 .  famotidine (PEPCID) 20 MG tablet, Take 1 tablet (20 mg total) by mouth 2 (two) times daily. Take for itching, Disp: 10 tablet, Rfl: 0 .  Fluticasone-Salmeterol (ADVAIR DISKUS) 250-50 MCG/DOSE AEPB, Inhale 1 puff into the lungs 2 (two) times daily., Disp: 60 each, Rfl: 0 .  furosemide (LASIX) 20 MG tablet, TAKE 1 TABLET BY MOUTH EVERY DAY, Disp: 90 tablet, Rfl: 2 .  gabapentin (NEURONTIN) 600 MG tablet, Take 1 tablet (600 mg total) by mouth every 8 (eight) hours., Disp: 90 tablet, Rfl: 5 .  hydrOXYzine (ATARAX/VISTARIL) 25 MG tablet, Take 1 tablet (25 mg total) by mouth every 6 (six) hours as needed for itching., Disp: 12 tablet, Rfl: 0 .  isosorbide mononitrate (IMDUR) 30 MG 24 hr tablet, Take 1 tablet (30 mg total) by mouth daily., Disp: 30 tablet, Rfl: 11 .  Multiple Vitamin (MULTIVITAMIN WITH MINERALS) TABS tablet, Take 2 tablets by mouth daily. , Disp: , Rfl:  .  NIFEdipine (PROCARDIA-XL/ADALAT-CC/NIFEDICAL-XL) 30 MG 24 hr tablet, Take 30 mg by mouth daily. , Disp: , Rfl:  .   ondansetron (ZOFRAN) 4 MG tablet, Take 1 tablet (4 mg total) by mouth every 8 (eight) hours as needed for nausea or vomiting., Disp: 30 tablet, Rfl: 3 .  oxyCODONE-acetaminophen (PERCOCET) 10-325 MG tablet, Take 1 tablet by mouth every 6 (six) hours as needed for pain., Disp: 120 tablet, Rfl: 0 .  pantoprazole (PROTONIX) 40 MG tablet, TAKE 1 TABLET BY MOUTH EVERY DAY, Disp: 30 tablet, Rfl: 5 .  potassium chloride (K-DUR) 10 MEQ tablet, Take 1 tablet (10 mEq total) by mouth 2 (two) times daily., Disp: 10 tablet, Rfl: 0 .  potassium chloride SA (K-DUR,KLOR-CON) 20 MEQ tablet, Take 20 mEq by mouth daily as needed. Take along with lasix for  potassium replacement, Disp: , Rfl:  .  Sennosides (EX-LAX) 15 MG CHEW, Chew 1 tablet by mouth daily as needed. For constipation, Disp: , Rfl:  .  traZODone (DESYREL) 50 MG tablet, Take 1 tablet (50 mg total) by mouth at bedtime. (Patient taking differently: Take 50 mg by mouth at bedtime as needed for sleep. ), Disp: 30 tablet, Rfl: 5  Past Surgical History  Procedure Laterality Date  . Excision/release bursa hip Right 12/1984  . Total hip arthroplasty Left 04-06-2014  . Tonsillectomy and adenoidectomy  ~ 1968  . Appendectomy  1985  . Abdominal hysterectomy  1985  . Cholecystectomy  2002  . Hernia repair  1985  . Umbilical hernia repair  1985  . Vena cava filter placement  03/2014  . Back surgery    . Anterior cervical decomp/discectomy fusion  10/2012  . Breast cyst excision Right   . Cardiac catheterization   2000's; 2009  . Left heart catheterization with coronary angiogram N/A 12/09/2014    Procedure: LEFT HEART CATHETERIZATION WITH CORONARY ANGIOGRAM;  Surgeon: Burnell Blanks, MD;  Location: [facility] CATH LAB;  Service: Cardiovascular;  Laterality: N/A;    Family History  Problem Relation Age of Onset  . Heart attack Neg Hx   . Stroke Mother     Allergies  Allergen Reactions  . Abilify [Aripiprazole] Other (See Comments)    Jerking movement,  slow motor skills  . Augmentin [Amoxicillin-Pot Clavulanate] Diarrhea and Nausea And Vomiting  . Bactrim [Sulfamethoxazole-Trimethoprim] Diarrhea  . Ceftin [Cefuroxime Axetil] Diarrhea  . Diclofenac Other (See Comments)     Muscle Pain  . Indocin [Indomethacin] Other (See Comments)    Migraines   . Linaclotide Diarrhea  . Lisinopril Diarrhea and Other (See Comments)    Other reaction(s): Dizziness, Vomiting  . Morphine Other (See Comments)    Chest Tightness  . Morphine And Related Other (See Comments)    Chest tightness   . Simvastatin Other (See Comments)    Muscle Pain  . Sulfa Antibiotics Diarrhea  . Sulfamethoxazole-Trimethoprim Diarrhea  . Wellbutrin [Bupropion] Other (See Comments)    Jerking movement Slurred speech  . Quetiapine Fumarate Palpitations     Review of Systems  CONSTITUTIONAL: No significant weight changes, fever, chills, weakness. Yes fatigue.  CARDIOVASCULAR: No chest pain, chest pressure or chest discomfort. No palpitations or edema.  RESPIRATORY: No shortness of breath. Yes cough. NEUROLOGICAL: No headache, dizziness, syncope, paralysis, ataxia, numbness or tingling in the extremities. No memory changes. No change in bowel or bladder control.  MUSCULOSKELETAL: Chronic joint pain. No muscle pain. PSYCHIATRIC: No change in mood. No change in sleep pattern.  ENDOCRINOLOGIC: No reports of sweating, cold or heat intolerance. No polyuria or polydipsia.     Objective  BP 128/76 mmHg  Pulse 88  Temp(Src) 98.4 F (36.9 C) (Oral)  Resp 16  Wt 207 lb (93.895 kg)  SpO2 95% Body mass index is 33.43 kg/(m^2).  Physical Exam  Constitutional: Patient appears obese and well-nourished. In no distress.  Cardiovascular: Normal rate, regular rhythm and normal heart sounds.  No murmur heard.  Pulmonary/Chest: Effort normal and breath sounds normal. No respiratory distress. Peripheral vascular: Bilateral LE no edema. Psychiatric: Patient has a normal mood and  affect. Behavior is normal in office today. Judgment and thought content normal in office today.   Assessment & Plan  1. Bilateral pneumonia Will repeat CXR to assess for resolution of findings on previous CXR. Will refer to pulmonology as her symptoms are chronic,  ongoing, waxing and waning.  - DG Chest 2 View; Future - Ambulatory referral to Pulmonology  2. Nausea Refilled.  - ondansetron (ZOFRAN) 4 MG tablet; Take 1 tablet (4 mg total) by mouth every 8 (eight) hours as needed for nausea or vomiting.  Dispense: 90 tablet; Refill: 5

## 2015-08-16 ENCOUNTER — Telehealth: Payer: Self-pay | Admitting: Family Medicine

## 2015-08-16 NOTE — Telephone Encounter (Signed)
Pt called states that her referral was not put in as urgent,therefore she has to wait until 09/10/15 and she feels like she cannot wait that long. Also pt states she was diagnosed with pneumonia was not given any antibiotics. Please advise pt.

## 2015-08-16 NOTE — Telephone Encounter (Signed)
Patient stated that she was told by the [place] office that since Dr. Nadine Counts did not put the order in as an urgent visit they would not schedule her any earlier than January. She also stated that she forgot to mention that she has been waking up every morning with bilateral hand swelling and could not understand why she was not given an antibiotic since she still has pneumonia.  I informed her that Dr. Nadine Counts was not in the office but that I would send her a message and will give her a call back. She said ok.
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
## 2015-08-20 IMAGING — CR DG HIP COMPLETE 2+V*L*
1 series · 3 of 3 positions shown · non-contrast
Comparison: None.

CLINICAL DATA: Knee pain.  Hip pain.

EXAM:
LEFT HIP - COMPLETE 2+ VIEW

[Series 1: ap · 0.17mm/px · 3 of 3 slices shown]
[im 1/3]
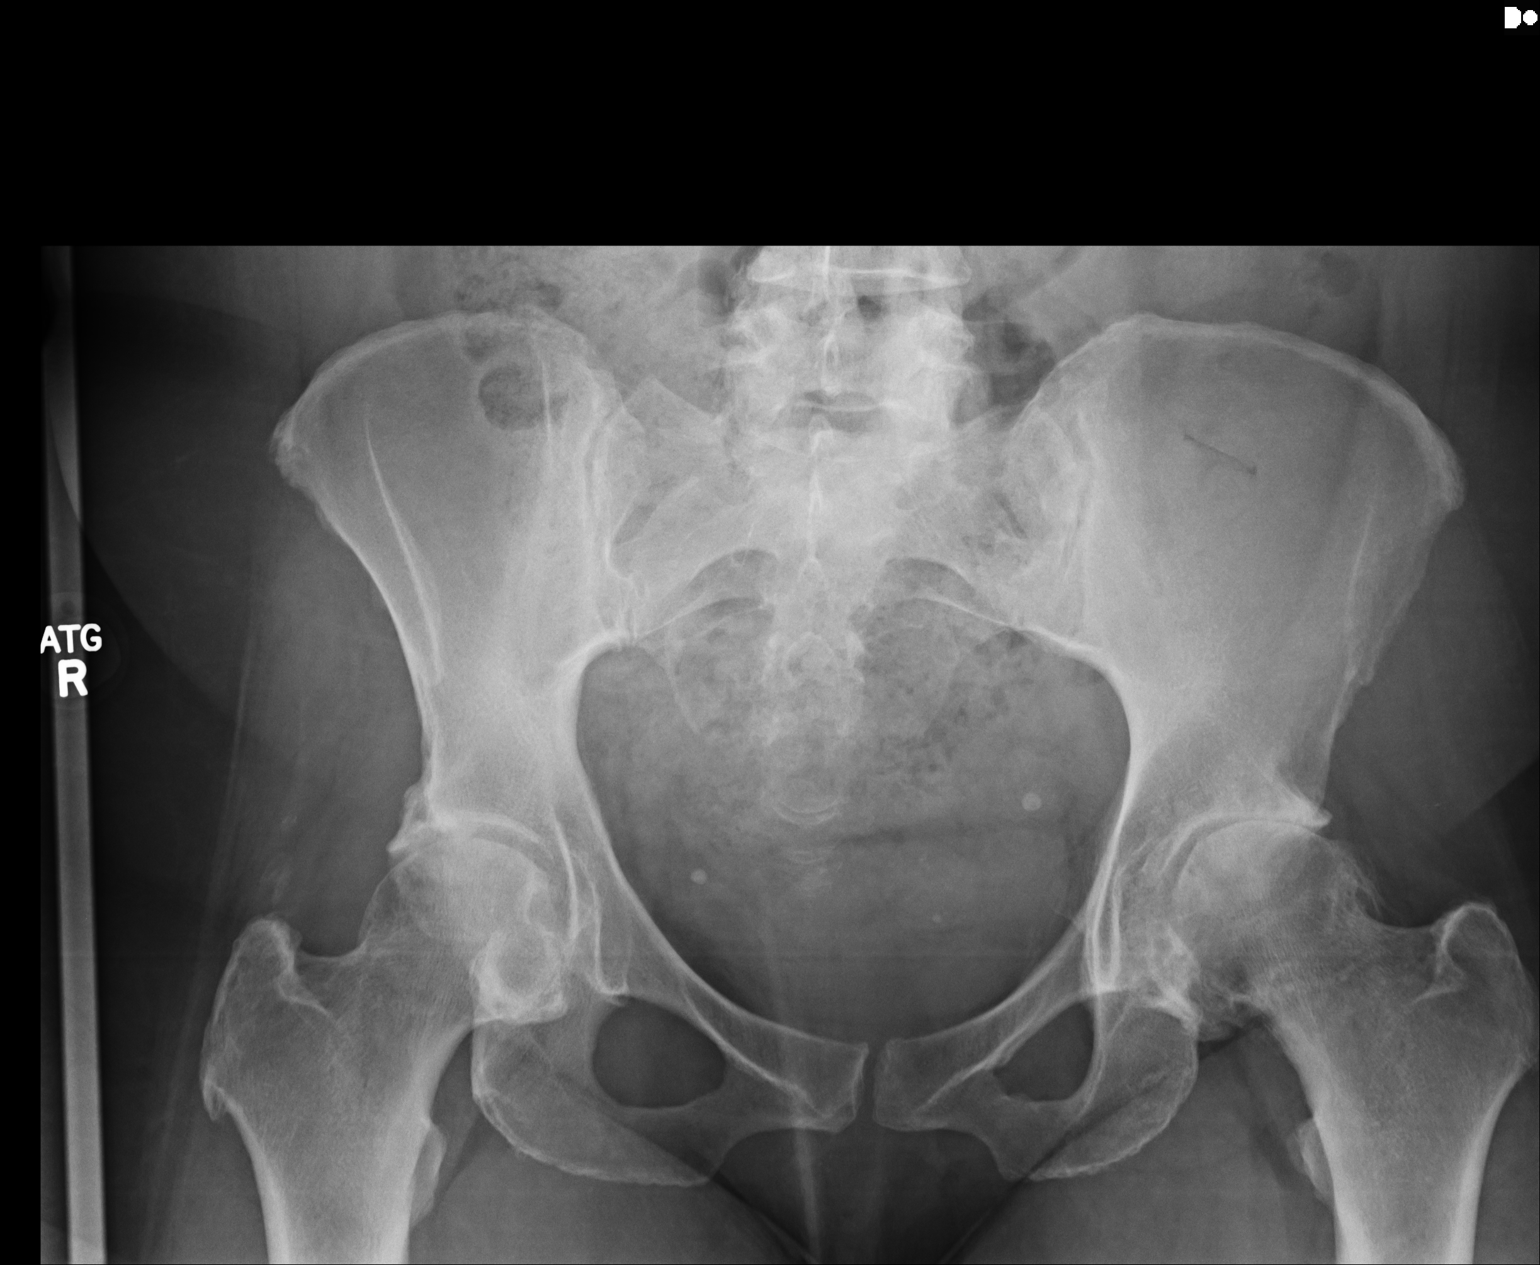
[im 2/3]
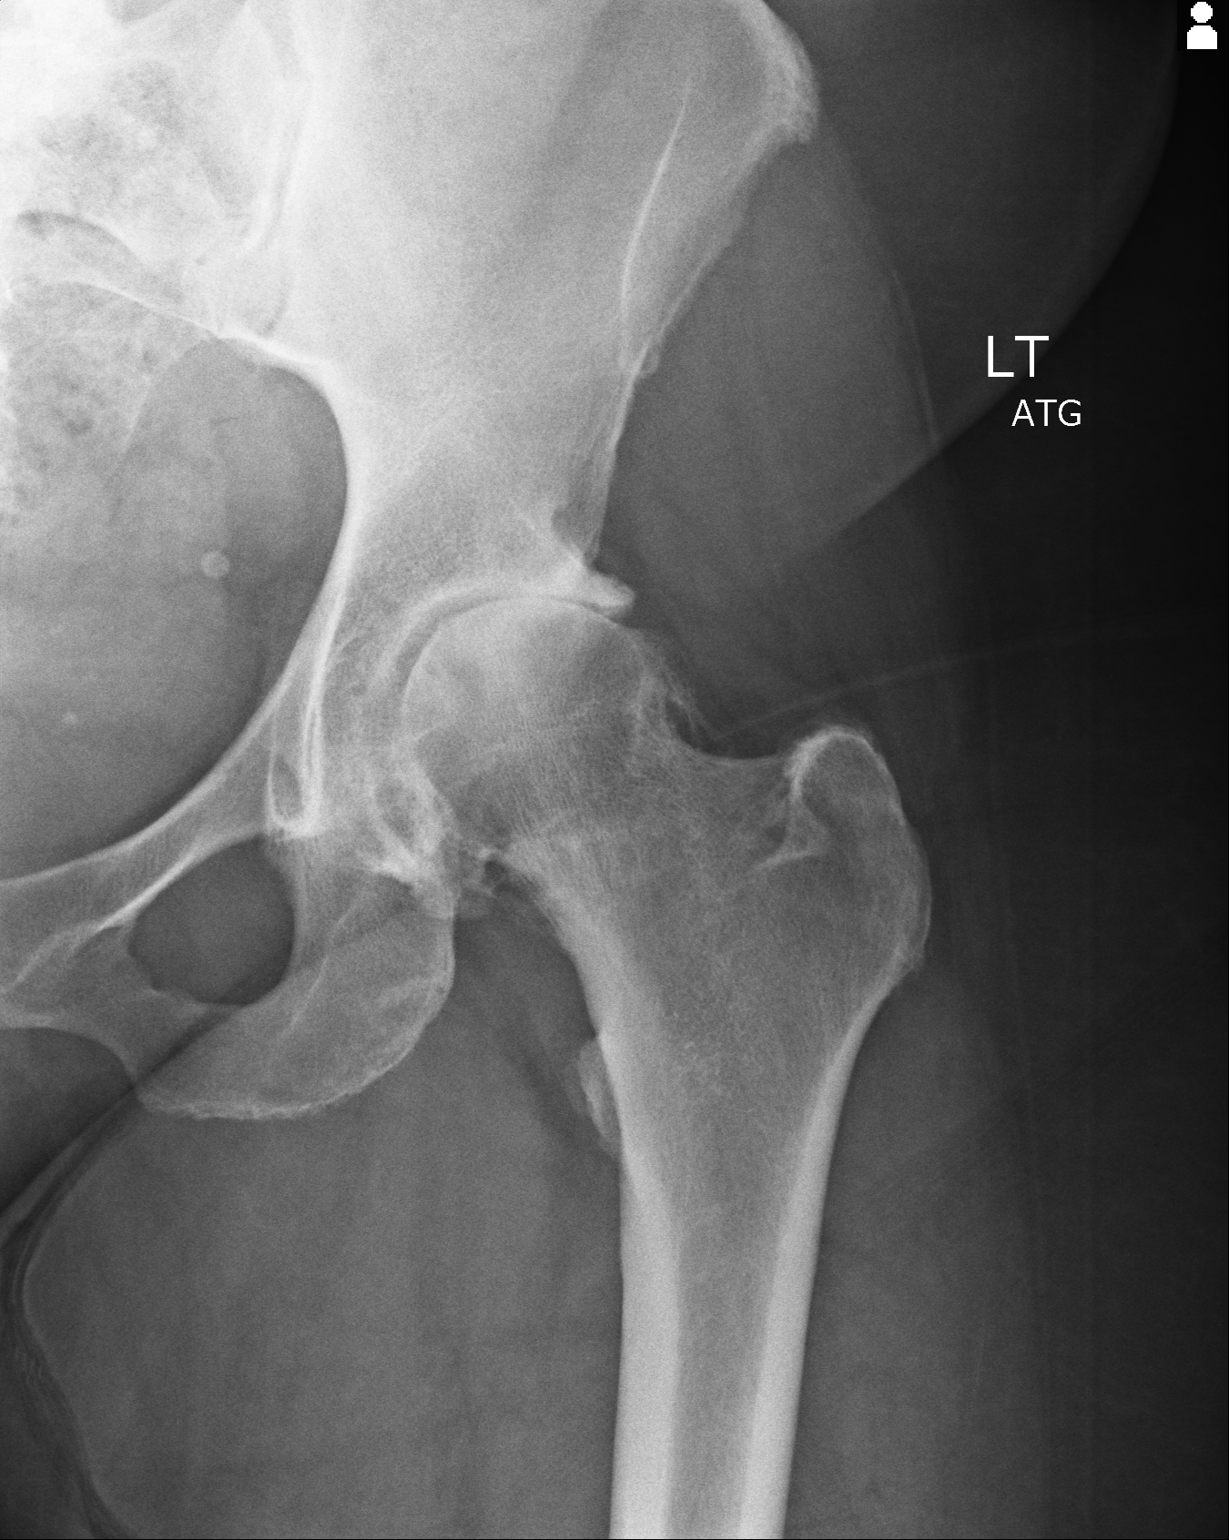
[im 3/3]
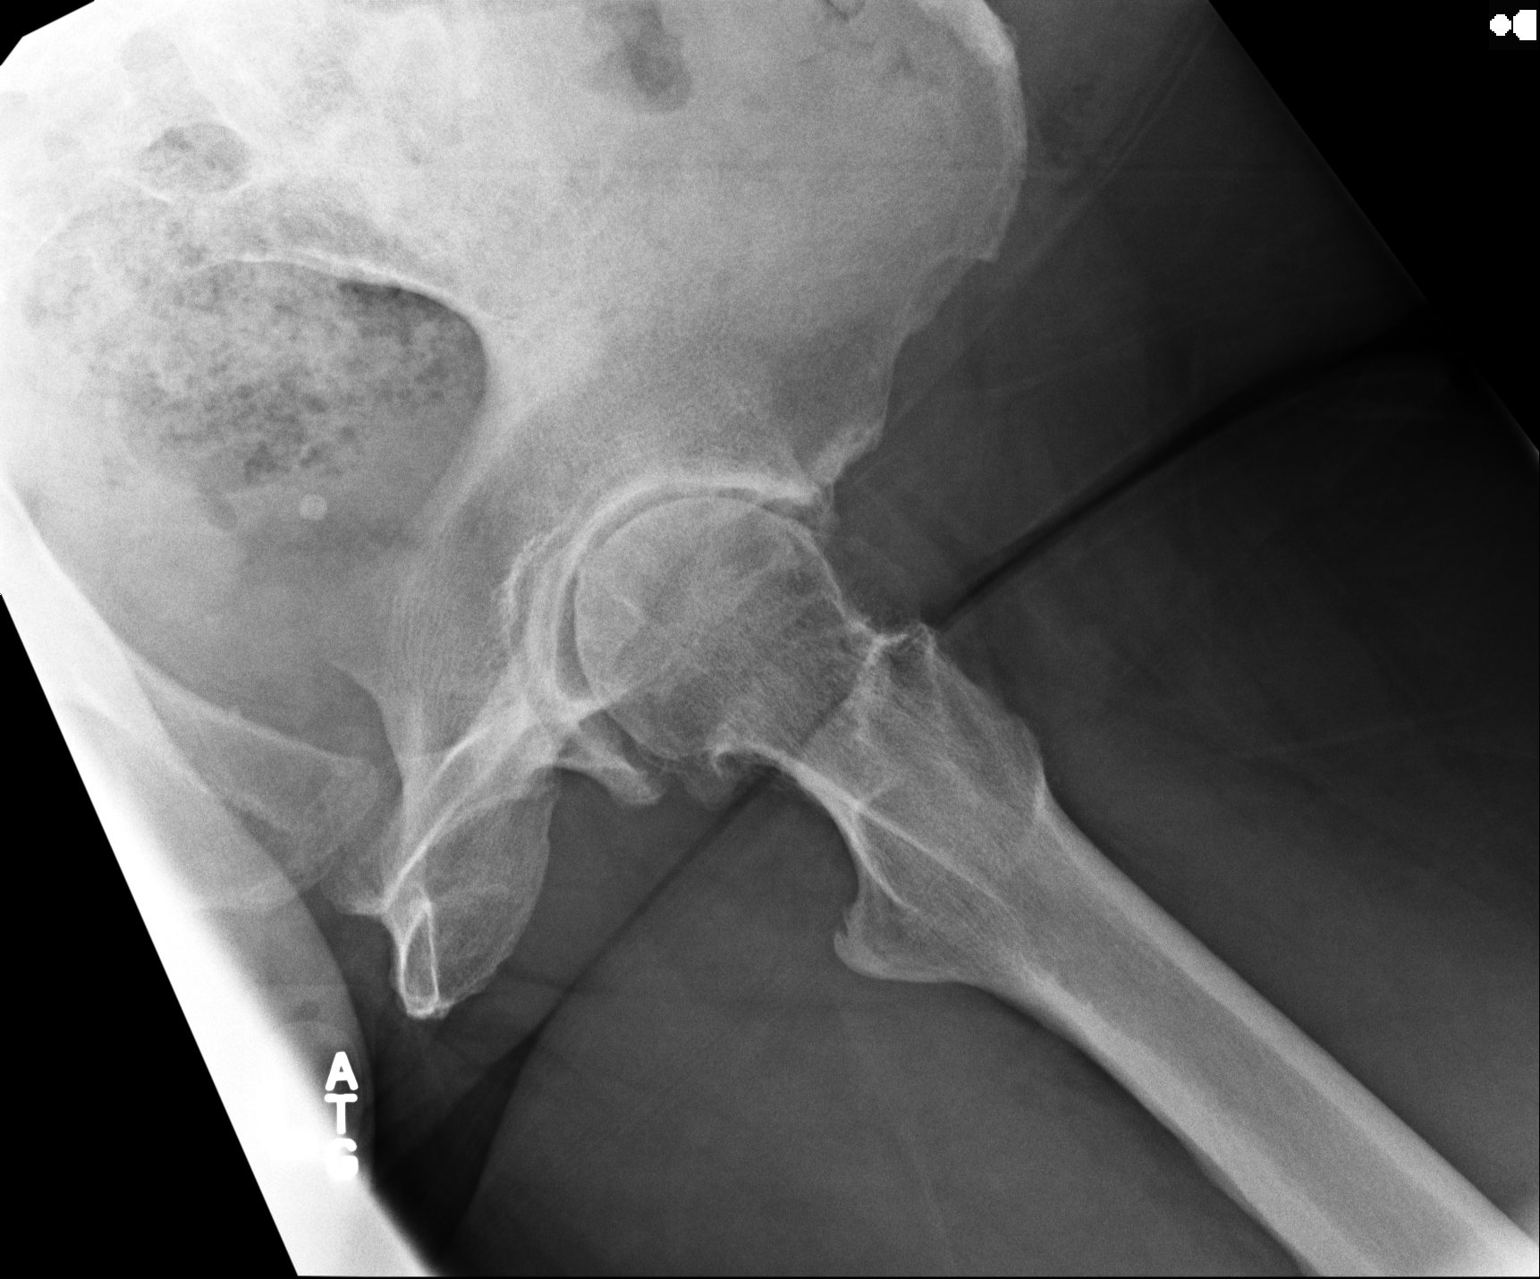

[3 of 3 positions shown; findings below may reference images not displayed]

FINDINGS: Severe left and moderate to severe right hip osteoarthritis with
loss of articular space and considerable spurring. Degenerative
subcortical sclerosis in the left acetabulum and left femoral head.

No fracture observed.
IMPRESSION: 1. Severe left and moderate to severe right hip osteoarthritis.

## 2015-08-20 NOTE — Telephone Encounter (Signed)
Patient was informed of Dr. Allie Dimmer message and stated that she feels something is not right because she is still coughing and having some breathing issues. I encouraged her to give the pulmonary office a call to see if they have any cancellation and if her sx worsens to go to the nearest ER.

## 2015-08-20 NOTE — Telephone Encounter (Signed)
Await pulmonology appointment for January, this is not an emergency, symptoms ongoing for months. No further antibiotic course needed because Repeat chest x-rays show:  The lungs are clear. Trace bilateral pleural effusions are decreased in size since the comparison examination. There is no pneumothorax. Heart size is normal. No Pneumonia.

## 2015-09-01 ENCOUNTER — Encounter (HOSPITAL_COMMUNITY): Payer: Self-pay

## 2015-09-01 ENCOUNTER — Emergency Department (HOSPITAL_COMMUNITY)
Admission: EM | Admit: 2015-09-01 | Discharge: 2015-09-01 | Disposition: A | Discharge status: Home / Self Care | Payer: No Typology Code available for payment source | Attending: Emergency Medicine | Admitting: Emergency Medicine

## 2015-09-01 ENCOUNTER — Emergency Department (HOSPITAL_COMMUNITY): Payer: No Typology Code available for payment source

## 2015-09-01 DIAGNOSIS — Z862 Personal history of diseases of the blood and blood-forming organs and certain disorders involving the immune mechanism: Secondary | ICD-10-CM | POA: Insufficient documentation

## 2015-09-01 DIAGNOSIS — K219 Gastro-esophageal reflux disease without esophagitis: Secondary | ICD-10-CM | POA: Insufficient documentation

## 2015-09-01 DIAGNOSIS — S7001XA Contusion of right hip, initial encounter: Secondary | ICD-10-CM | POA: Diagnosis not present

## 2015-09-01 DIAGNOSIS — K589 Irritable bowel syndrome without diarrhea: Secondary | ICD-10-CM | POA: Insufficient documentation

## 2015-09-01 DIAGNOSIS — S79921A Unspecified injury of right thigh, initial encounter: Secondary | ICD-10-CM | POA: Diagnosis not present

## 2015-09-01 DIAGNOSIS — G43909 Migraine, unspecified, not intractable, without status migrainosus: Secondary | ICD-10-CM | POA: Insufficient documentation

## 2015-09-01 DIAGNOSIS — Z87891 Personal history of nicotine dependence: Secondary | ICD-10-CM | POA: Insufficient documentation

## 2015-09-01 DIAGNOSIS — M25531 Pain in right wrist: Secondary | ICD-10-CM | POA: Diagnosis not present

## 2015-09-01 DIAGNOSIS — Z8701 Personal history of pneumonia (recurrent): Secondary | ICD-10-CM | POA: Diagnosis not present

## 2015-09-01 DIAGNOSIS — Z86711 Personal history of pulmonary embolism: Secondary | ICD-10-CM | POA: Diagnosis not present

## 2015-09-01 DIAGNOSIS — Y92410 Unspecified street and highway as the place of occurrence of the external cause: Secondary | ICD-10-CM | POA: Insufficient documentation

## 2015-09-01 DIAGNOSIS — I10 Essential (primary) hypertension: Secondary | ICD-10-CM | POA: Diagnosis not present

## 2015-09-01 DIAGNOSIS — Z7951 Long term (current) use of inhaled steroids: Secondary | ICD-10-CM | POA: Diagnosis not present

## 2015-09-01 DIAGNOSIS — Z9889 Other specified postprocedural states: Secondary | ICD-10-CM | POA: Insufficient documentation

## 2015-09-01 DIAGNOSIS — S5001XA Contusion of right elbow, initial encounter: Secondary | ICD-10-CM | POA: Diagnosis not present

## 2015-09-01 DIAGNOSIS — S0990XA Unspecified injury of head, initial encounter: Secondary | ICD-10-CM | POA: Insufficient documentation

## 2015-09-01 DIAGNOSIS — I252 Old myocardial infarction: Secondary | ICD-10-CM | POA: Diagnosis not present

## 2015-09-01 DIAGNOSIS — Y9301 Activity, walking, marching and hiking: Secondary | ICD-10-CM | POA: Insufficient documentation

## 2015-09-01 DIAGNOSIS — M1990 Unspecified osteoarthritis, unspecified site: Secondary | ICD-10-CM | POA: Insufficient documentation

## 2015-09-01 DIAGNOSIS — Y998 Other external cause status: Secondary | ICD-10-CM | POA: Diagnosis not present

## 2015-09-01 DIAGNOSIS — M79651 Pain in right thigh: Secondary | ICD-10-CM | POA: Diagnosis not present

## 2015-09-01 DIAGNOSIS — S8991XA Unspecified injury of right lower leg, initial encounter: Secondary | ICD-10-CM | POA: Diagnosis not present

## 2015-09-01 DIAGNOSIS — F329 Major depressive disorder, single episode, unspecified: Secondary | ICD-10-CM | POA: Diagnosis not present

## 2015-09-01 DIAGNOSIS — G8929 Other chronic pain: Secondary | ICD-10-CM | POA: Insufficient documentation

## 2015-09-01 DIAGNOSIS — E119 Type 2 diabetes mellitus without complications: Secondary | ICD-10-CM | POA: Insufficient documentation

## 2015-09-01 DIAGNOSIS — Z79899 Other long term (current) drug therapy: Secondary | ICD-10-CM | POA: Insufficient documentation

## 2015-09-01 DIAGNOSIS — S59901A Unspecified injury of right elbow, initial encounter: Secondary | ICD-10-CM | POA: Diagnosis not present

## 2015-09-01 DIAGNOSIS — T1490XA Injury, unspecified, initial encounter: Secondary | ICD-10-CM

## 2015-09-01 DIAGNOSIS — M25551 Pain in right hip: Secondary | ICD-10-CM | POA: Diagnosis not present

## 2015-09-01 DIAGNOSIS — S6991XA Unspecified injury of right wrist, hand and finger(s), initial encounter: Secondary | ICD-10-CM | POA: Diagnosis not present

## 2015-09-01 DIAGNOSIS — S79911A Unspecified injury of right hip, initial encounter: Secondary | ICD-10-CM | POA: Diagnosis not present

## 2015-09-01 MED ORDER — HYDROMORPHONE HCL 1 MG/ML IJ SOLN
1.0000 mg | Freq: Once | INTRAMUSCULAR | Status: AC
Start: 2015-09-01 — End: 2015-09-01
  Administered 2015-09-01: 1 mg via INTRAMUSCULAR
  Filled 2015-09-01: qty 1

## 2015-09-01 MED ORDER — OXYCODONE-ACETAMINOPHEN 5-325 MG PO TABS
1.0000 | ORAL_TABLET | Freq: Once | ORAL | Status: AC
  Administered 2015-09-01: 1 via ORAL
  Filled 2015-09-01: qty 1

## 2015-09-01 MED ORDER — OXYCODONE-ACETAMINOPHEN 5-325 MG PO TABS: 1.0000 | ORAL_TABLET | Freq: Four times a day (QID) | ORAL | Status: DC | PRN
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
## 2015-09-01 NOTE — Discharge Instructions (Signed)
Return here as needed.  Follow-up with your primary care doctor and the x-rays did not show any abnormalities.  Wear the arm sling for comfort

## 2015-09-03 ENCOUNTER — Other Ambulatory Visit: Payer: Self-pay | Admitting: Cardiovascular Disease

## 2015-09-03 ENCOUNTER — Other Ambulatory Visit: Payer: Self-pay | Admitting: Family Medicine

## 2015-09-05 ENCOUNTER — Telehealth: Payer: Self-pay | Admitting: Family Medicine

## 2015-09-05 NOTE — Telephone Encounter (Signed)
REFILL ON PERCOCET.

## 2015-09-06 DIAGNOSIS — M5021 Other cervical disc displacement,  high cervical region: Secondary | ICD-10-CM | POA: Diagnosis not present

## 2015-09-06 DIAGNOSIS — Z6832 Body mass index (BMI) 32.0-32.9, adult: Secondary | ICD-10-CM | POA: Diagnosis not present

## 2015-09-10 ENCOUNTER — Ambulatory Visit: Payer: Self-pay | Admitting: Pulmonary Disease

## 2015-09-12 ENCOUNTER — Telehealth: Payer: Self-pay | Admitting: Family Medicine

## 2015-09-12 NOTE — Telephone Encounter (Signed)
Patient is calling for a refill on Percocet.  Patient stated that she called on 09/05/15 asking for the refill but hasn't received a response.  Please advise.

## 2015-09-13 ENCOUNTER — Other Ambulatory Visit: Payer: Self-pay

## 2015-09-13 MED ORDER — OXYCODONE-ACETAMINOPHEN 5-325 MG PO TABS: 1.0000 | ORAL_TABLET | Freq: Four times a day (QID) | ORAL | Status: DC | PRN

## 2015-09-13 NOTE — Telephone Encounter (Signed)
Pt came up here to office very aggravated due to Dr. Nadine Counts not being here and rx not being filled.   Miel said if you would go ahead and write for 5 day supply and Dr. Nadine Counts could fill the rest on Tuesday when she returns

## 2015-09-13 NOTE — Telephone Encounter (Signed)
Dr. Ancil Boozer wrote patient RX for percocet 12 pills.  When I took RX out to patient in the lobby to notify her Dr. Ancil Boozer wrote for 12 pills only  and she would have to get the rest on Tuesday when Dr. Nadine Counts returned.  Patient got very upset and denied the RX and said don't worry about her and walked out.

## 2015-09-17 ENCOUNTER — Emergency Department (HOSPITAL_COMMUNITY)
Admission: EM | Admit: 2015-09-17 | Discharge: 2015-09-17 | Disposition: A | Discharge status: Home / Self Care | Payer: Medicare Other | Attending: Emergency Medicine | Admitting: Emergency Medicine

## 2015-09-17 ENCOUNTER — Inpatient Hospital Stay (HOSPITAL_COMMUNITY): Admit: 2015-09-17 | Payer: Self-pay

## 2015-09-17 ENCOUNTER — Encounter (HOSPITAL_COMMUNITY): Payer: Self-pay

## 2015-09-17 ENCOUNTER — Emergency Department (HOSPITAL_BASED_OUTPATIENT_CLINIC_OR_DEPARTMENT_OTHER): Payer: Medicare Other

## 2015-09-17 DIAGNOSIS — M25531 Pain in right wrist: Secondary | ICD-10-CM | POA: Diagnosis not present

## 2015-09-17 DIAGNOSIS — F259 Schizoaffective disorder, unspecified: Secondary | ICD-10-CM | POA: Diagnosis not present

## 2015-09-17 DIAGNOSIS — Z8701 Personal history of pneumonia (recurrent): Secondary | ICD-10-CM | POA: Insufficient documentation

## 2015-09-17 DIAGNOSIS — M79609 Pain in unspecified limb: Secondary | ICD-10-CM

## 2015-09-17 DIAGNOSIS — Z7901 Long term (current) use of anticoagulants: Secondary | ICD-10-CM | POA: Diagnosis not present

## 2015-09-17 DIAGNOSIS — E119 Type 2 diabetes mellitus without complications: Secondary | ICD-10-CM | POA: Insufficient documentation

## 2015-09-17 DIAGNOSIS — F329 Major depressive disorder, single episode, unspecified: Secondary | ICD-10-CM | POA: Insufficient documentation

## 2015-09-17 DIAGNOSIS — G43909 Migraine, unspecified, not intractable, without status migrainosus: Secondary | ICD-10-CM | POA: Diagnosis not present

## 2015-09-17 DIAGNOSIS — Z862 Personal history of diseases of the blood and blood-forming organs and certain disorders involving the immune mechanism: Secondary | ICD-10-CM | POA: Diagnosis not present

## 2015-09-17 DIAGNOSIS — R2231 Localized swelling, mass and lump, right upper limb: Secondary | ICD-10-CM | POA: Insufficient documentation

## 2015-09-17 DIAGNOSIS — Z87891 Personal history of nicotine dependence: Secondary | ICD-10-CM | POA: Insufficient documentation

## 2015-09-17 DIAGNOSIS — F419 Anxiety disorder, unspecified: Secondary | ICD-10-CM | POA: Diagnosis not present

## 2015-09-17 DIAGNOSIS — I10 Essential (primary) hypertension: Secondary | ICD-10-CM | POA: Insufficient documentation

## 2015-09-17 DIAGNOSIS — I252 Old myocardial infarction: Secondary | ICD-10-CM | POA: Diagnosis not present

## 2015-09-17 DIAGNOSIS — G8929 Other chronic pain: Secondary | ICD-10-CM | POA: Diagnosis not present

## 2015-09-17 DIAGNOSIS — Z86711 Personal history of pulmonary embolism: Secondary | ICD-10-CM | POA: Diagnosis not present

## 2015-09-17 DIAGNOSIS — K219 Gastro-esophageal reflux disease without esophagitis: Secondary | ICD-10-CM | POA: Insufficient documentation

## 2015-09-17 NOTE — ED Notes (Addendum)
Pt noticed some swelling to her right wrist a couple days ago and put some ice on it. Already takes a blood thinner. Has an IVC filter but it failed and takes eliquis. Has worsening pain in her upper right arm but the only thing that will fix that is surgery per her MD. No known injury

## 2015-09-17 NOTE — Progress Notes (Signed)
Preliminary results by tech - Right upper ext.venous completed. Negative for deep and superficial vein thrombosis. A small cystis structure, less than 1 cm,  was noted at the right lateral wrist area. Oda Cogan, BS, RDMS, RVT

## 2015-09-17 NOTE — ED Notes (Signed)
Mild swelling noted to the right wrist. 2+ pulses. Denies any sensation changes. patient able to move all digits. States pain with movement.

## 2015-09-17 NOTE — ED Notes (Signed)
Pt is in stable condition upon d/c and ambulates from ED. 

## 2015-09-17 NOTE — Discharge Instructions (Signed)
Cryotherapy °Cryotherapy means treatment with cold. Ice or gel packs can be used to reduce both pain and swelling. Ice is the most helpful within the first 24 to 48 hours after an injury or flare-up from overusing a muscle or joint. Sprains, strains, spasms, burning pain, shooting pain, and aches can all be eased with ice. Ice can also be used when recovering from surgery. Ice is effective, has very few side effects, and is safe for most people to use. °PRECAUTIONS  °Ice is not a safe treatment option for people with: °· Raynaud phenomenon. This is a condition affecting small blood vessels in the extremities. Exposure to cold may cause your problems to return. °· Cold hypersensitivity. There are many forms of cold hypersensitivity, including: °· Cold urticaria. Red, itchy hives appear on the skin when the tissues begin to warm after being iced. °· Cold erythema. This is a red, itchy rash caused by exposure to cold. °· Cold hemoglobinuria. Red blood cells break down when the tissues begin to warm after being iced. The hemoglobin that carry oxygen are passed into the urine because they cannot combine with blood proteins fast enough. °· Numbness or altered sensitivity in the area being iced. °If you have any of the following conditions, do not use ice until you have discussed cryotherapy with your caregiver: °· Heart conditions, such as arrhythmia, angina, or chronic heart disease. °· High blood pressure. °· Healing wounds or open skin in the area being iced. °· Current infections. °· Rheumatoid arthritis. °· Poor circulation. °· Diabetes. °Ice slows the blood flow in the region it is applied. This is beneficial when trying to stop inflamed tissues from spreading irritating chemicals to surrounding tissues. However, if you expose your skin to cold temperatures for too long or without the proper protection, you can damage your skin or nerves. Watch for signs of skin damage due to cold. °HOME CARE INSTRUCTIONS °Follow  these tips to use ice and cold packs safely. °· Place a dry or damp towel between the ice and skin. A damp towel will cool the skin more quickly, so you may need to shorten the time that the ice is used. °· For a more rapid response, add gentle compression to the ice. °· Ice for no more than 10 to 20 minutes at a time. The bonier the area you are icing, the less time it will take to get the benefits of ice. °· Check your skin after 5 minutes to make sure there are no signs of a poor response to cold or skin damage. °· Rest 20 minutes or more between uses. °· Once your skin is numb, you can end your treatment. You can test numbness by very lightly touching your skin. The touch should be so light that you do not see the skin dimple from the pressure of your fingertip. When using ice, most people will feel these normal sensations in this order: cold, burning, aching, and numbness. °· Do not use ice on someone who cannot communicate their responses to pain, such as small children or people with dementia. °HOW TO MAKE AN ICE PACK °Ice packs are the most common way to use ice therapy. Other methods include ice massage, ice baths, and cryosprays. Muscle creams that cause a cold, tingly feeling do not offer the same benefits that ice offers and should not be used as a substitute unless recommended by your caregiver. °To make an ice pack, do one of the following: °· Place crushed ice or a   bag of frozen vegetables in a sealable plastic bag. Squeeze out the excess air. Place this bag inside another plastic bag. Slide the bag into a pillowcase or place a damp towel between your skin and the bag.  Mix 3 parts water with 1 part rubbing alcohol. Freeze the mixture in a sealable plastic bag. When you remove the mixture from the freezer, it will be slushy. Squeeze out the excess air. Place this bag inside another plastic bag. Slide the bag into a pillowcase or place a damp towel between your skin and the bag. SEEK MEDICAL CARE  IF:  You develop white spots on your skin. This may give the skin a blotchy (mottled) appearance.  Your skin turns blue or pale.  Your skin becomes waxy or hard.  Your swelling gets worse. MAKE SURE YOU:   Understand these instructions.  Will watch your condition.  Will get help right away if you are not doing well or get worse.   This information is not intended to replace advice given to you by your health care provider. Make sure you discuss any questions you have with your health care provider.   Document Released: 04/14/2011 Document Revised: 09/08/2014 Document Reviewed: 04/14/2011 Elsevier Interactive Patient Education 2016 [employer]  Wrist Pain There are many things that can cause wrist pain. Some common causes include:  An injury to the wrist area, such as a sprain, strain, or fracture.  Overuse of the joint.  A condition that causes increased pressure on a nerve in the wrist (carpal tunnel syndrome).  Wear and tear of the joints that occurs with aging (osteoarthritis).  A variety of other types of arthritis. Sometimes, the cause of wrist pain is not known. The pain often goes away when you follow your health care provider's instructions for relieving pain at home. If your wrist pain continues, tests may need to be done to diagnose your condition. HOME CARE INSTRUCTIONS Pay attention to any changes in your symptoms. Take these actions to help with your pain:  Rest the wrist area for at least 48 hours or as told by your health care provider.  If directed, apply ice to the injured area:  Put ice in a plastic bag.  Place a towel between your skin and the bag.  Leave the ice on for 20 minutes, 2-3 times per day.  Keep your arm raised (elevated) above the level of your heart while you are sitting or lying down.  If a splint or elastic bandage has been applied, use it as told by your health care provider.  Remove the splint or bandage only as told by your  health care provider.  Loosen the splint or bandage if your fingers become numb or have a tingling feeling, or if they turn cold or blue.  Take over-the-counter and prescription medicines only as told by your health care provider.  Keep all follow-up visits as told by your health care provider. This is important. SEEK MEDICAL CARE IF:  Your pain is not helped by treatment.  Your pain gets worse. SEEK IMMEDIATE MEDICAL CARE IF:  Your fingers become swollen.  Your fingers turn white, very red, or cold and blue.  Your fingers are numb or have a tingling feeling.  You have difficulty moving your fingers.   This information is not intended to replace advice given to you by your health care provider. Make sure you discuss any questions you have with your health care provider.  Follow up with hand orthopedic  provider if symptoms do not improve. Keep wrist in brace. Apply ice to affected area. Keep extremity elevated. Return to the ED if you experience severe worsening of you symptoms, increased swelling, numbness, discoloration of your extremity, chest pain, shortness of breath.
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
## 2015-09-18 ENCOUNTER — Other Ambulatory Visit: Payer: Self-pay | Admitting: Family Medicine

## 2015-09-18 ENCOUNTER — Other Ambulatory Visit: Payer: Self-pay

## 2015-09-18 MED ORDER — OXYCODONE-ACETAMINOPHEN 5-325 MG PO TABS: 1.0000 | ORAL_TABLET | Freq: Two times a day (BID) | ORAL | Status: DC | PRN

## 2015-09-18 NOTE — Telephone Encounter (Signed)
Spoke with patient this morning asking if she still wanted her regular RX for the percocet medication.  Patient informed that she still needs this medication and I informed her that we will call her once the RX is ready for pick up.  I also informed patient that Dr. Nadine Counts will be leaving the practice effective 11/16/15.

## 2015-09-18 NOTE — Telephone Encounter (Signed)
Patient came to clinic but declined RX

## 2015-09-25 ENCOUNTER — Telehealth: Payer: Self-pay

## 2015-09-25 DIAGNOSIS — M654 Radial styloid tenosynovitis [de Quervain]: Secondary | ICD-10-CM | POA: Diagnosis not present

## 2015-09-25 NOTE — Telephone Encounter (Signed)
Patient came in today stating she switched pharmacies and we she did, they are not filling her prescriptions correctly.  For medications that should be taken 1 a day she should be getting a 30 day supply and they are only giving 15 on multiple RXs.  I told the patient I would notify Dr. Nadine Counts and If she was unhappy with the pharmacy she should switch and make sure she is counting her pills for accuracy.

## 2015-09-27 ENCOUNTER — Other Ambulatory Visit: Payer: Self-pay | Admitting: Family Medicine

## 2015-09-27 NOTE — Telephone Encounter (Signed)
Pt states [facility] only gave her 15 BP pills and would like a refill on this.

## 2015-09-28 MED ORDER — DILTIAZEM HCL ER COATED BEADS 120 MG PO CP24
120.0000 mg | ORAL_CAPSULE | Freq: Every day | ORAL | Status: DC
Start: 2015-09-28 — End: 2016-02-05

## 2015-09-29 ENCOUNTER — Other Ambulatory Visit: Payer: Self-pay | Admitting: Family Medicine

## 2015-10-02 ENCOUNTER — Ambulatory Visit (INDEPENDENT_AMBULATORY_CARE_PROVIDER_SITE_OTHER): Payer: Medicare Other | Admitting: Pulmonary Disease

## 2015-10-02 ENCOUNTER — Encounter: Payer: Self-pay | Admitting: *Deleted

## 2015-10-02 ENCOUNTER — Encounter: Payer: Self-pay | Admitting: Pulmonary Disease

## 2015-10-02 ENCOUNTER — Other Ambulatory Visit
Admission: RE | Admit: 2015-10-02 | Discharge: 2015-10-02 | Disposition: A | Discharge status: Home / Self Care | Payer: Medicare Other | Source: Ambulatory Visit | Attending: Pulmonary Disease | Admitting: Pulmonary Disease

## 2015-10-02 VITALS — BP 132/84 | HR 88 | Ht 66.0 in | Wt 206.8 lb

## 2015-10-02 DIAGNOSIS — J90 Pleural effusion, not elsewhere classified: Secondary | ICD-10-CM | POA: Insufficient documentation

## 2015-10-02 DIAGNOSIS — I5032 Chronic diastolic (congestive) heart failure: Secondary | ICD-10-CM | POA: Insufficient documentation

## 2015-10-02 DIAGNOSIS — R05 Cough: Secondary | ICD-10-CM | POA: Diagnosis not present

## 2015-10-02 DIAGNOSIS — R0600 Dyspnea, unspecified: Secondary | ICD-10-CM | POA: Diagnosis not present

## 2015-10-02 DIAGNOSIS — R059 Cough, unspecified: Secondary | ICD-10-CM

## 2015-10-02 DIAGNOSIS — Z86711 Personal history of pulmonary embolism: Secondary | ICD-10-CM

## 2015-10-02 LAB — BRAIN NATRIURETIC PEPTIDE: B Natriuretic Peptide: 38 pg/mL (ref 0.0–100.0)

## 2015-10-02 LAB — SEDIMENTATION RATE: Sed Rate: 45 mm/hr — ABNORMAL HIGH (ref 0–30)

## 2015-10-02 MED ORDER — TRIAMCINOLONE ACETONIDE 55 MCG/ACT NA AERO: 2.0000 | INHALATION_SPRAY | Freq: Every day | NASAL | Status: DC

## 2015-10-02 NOTE — Progress Notes (Signed)
PULMONARY CONSULT NOTE  Requesting MD/Service: [name], MD Date of initial consultation: 10/02/15 Reason for consultation: cough, dyspnea  PT PROFILE: 60 y.o. F with PMH of recurrent PE, prior PNA, small bilateral pleural effusions on recurrent CXRs referred for evaluation of chronic cough and DOE  HPI:  60 y.o. F with PMH of recurrent PE, prior PNA, small bilateral pleural effusions on recurrent CXRs referred for evaluation of chronic cough and DOE. She initially indicated that she was sent for evaluation of recurrent PNA and describes an episode of PNA in Aug 2015 during a hospitalization for hip replacement. At that time, she had SOB, chest heaviness, weakness, cough and fever. She was treated with antibiotics and recovered fully back to her baseline. She had another episode of increased dyspnea and cough in Dec 2106 and was again diagnosed with PNA and treated with an antibiotic with return to her baseline. Her symptoms at that time did not include fever. Presently, she complains of persistent cough and posterior nasal drainage. Her cough is largely nonproductive and sometimes wakens her from sleep. She also reports chronic DOE and occasional chest heaviness which can happen at rest and sometimes be provoked by severe exertion. She has chronic LE edema. She denies orthopnea, PND, calf tenderness, hemoptysis, fevers, sinus pressure  Past Medical History  Diagnosis Date  . Spinal stenosis   . Hypertension   . Pulmonary embolism ([place]) 04/2001; 09/19/2014    "after gallbladder OR; "  . High cholesterol   . Myocardial infarction ([place]) 10/2010 X 3    "while hospitalized"  . Pneumonia 04/2014  . Sleep apnea     "mild" (09/19/2014)  . Type II diabetes mellitus ([place])     "dx'd in 2007; lost weight; no RX for ~ 4 yr now" (09/19/2014)  . Anemia   . History of blood transfusion 1985    related to hysterectomy  . Esophageal spasm   . Gastritis   . Gastroenteritis   . IBS (irritable bowel  syndrome)   . GERD (gastroesophageal reflux disease)   . Headache     "couple /month lately" (09/19/2014)  . Migraine     "a few times/yr" (09/19/2014)  . Osteoarthritis     "qwhere; mostly around my joints" (09/19/2014)  . Spondylosis   . Chronic back pain     "upper and lower" (09/19/2014)  . Depression     "major" (09/19/2014)  . PTSD (post-traumatic stress disorder)   . Schizoaffective disorder ([place])   . Snoring     a. sleep study 5/16: No OSA    Past Surgical History  Procedure Laterality Date  . Excision/release bursa hip Right 12/1984  . Total hip arthroplasty Left 04-06-2014  . Tonsillectomy and adenoidectomy  ~ 1968  . Appendectomy  1985  . Abdominal hysterectomy  1985  . Cholecystectomy  2002  . Hernia repair  1985  . Umbilical hernia repair  1985  . Vena cava filter placement  03/2014  . Back surgery    . Anterior cervical decomp/discectomy fusion  10/2012  . Breast cyst excision Right   . Cardiac catheterization   2000's; 2009  . Left heart catheterization with coronary angiogram N/A 12/09/2014    Procedure: LEFT HEART CATHETERIZATION WITH CORONARY ANGIOGRAM;  Surgeon: Burnell Blanks, MD;  Location: [facility] CATH LAB;  Service: Cardiovascular;  Laterality: N/A;    MEDICATIONS: I have reviewed all medications and confirmed regimen as documented  Social History   Social History  . Marital Status: Single  Spouse Name: N/A  . Number of Children: N/A  . Years of Education: N/A   Occupational History  . Not on file.   Social History Main Topics  . Smoking status: Former Smoker -- 1.50 packs/day for 10 years    Types: Cigarettes    Quit date: 04/12/1979  . Smokeless tobacco: Never Used  . Alcohol Use: 0.0 oz/week    0 Standard drinks or equivalent per week     Comment: alcohol 08-31-15  . Drug Use: No  . Sexual Activity: Not Currently   Other Topics Concern  . Not on file   Social History Narrative    Family History  Problem Relation Age of Onset   . Heart attack Neg Hx   . Stroke Mother     ROS: No fever, myalgias/arthralgias, unexplained weight loss or weight gain No new focal weakness or sensory deficits No otalgia, hearing loss, visual changes, mouth and throat problems No neck pain or adenopathy No abdominal pain, N/V/D, diarrhea, change in bowel pattern No dysuria, change in urinary pattern   Filed Vitals:   10/02/15 1330  BP: 132/84  Pulse: 88  Height: '5\' 6"'$  (1.676 m)  Weight: 206 lb 12.8 oz (93.804 kg)  SpO2: 98%     EXAM:  Gen: WDWN, Pleasant, No distress HEENT: NCAT, TMs and canals normal, sclera white, nares and nasal mucosa normal, oropharynx normal Neck: Supple without LAN, thyromegaly, JVD Lungs: breath sounds full, percussion note normal, No wheezes Cardiovascular: Reg, no murmurs noted Abdomen: Mildly obese, soft, nontender, normal BS Ext: without clubbing, cyanosis, edema Neuro: CNs grossly intact, motor and sensory intact, DTRs symmetric Skin: Limited exam, no lesions noted  DATA:   BMP Latest Ref Rng 07/19/2015 05/24/2015 03/22/2015  Glucose 65 - 99 mg/dL 98 94 100(H)  BUN 6 - 20 mg/dL '7 8 6  '$ Creatinine 0.44 - 1.00 mg/dL 0.80 0.81 0.82  Sodium 135 - 145 mmol/L 139 140 138  Potassium 3.5 - 5.1 mmol/L 4.0 3.8 4.0  Chloride 101 - 111 mmol/L 104 108 103  CO2 22 - 32 mmol/L '27 26 28  '$ Calcium 8.9 - 10.3 mg/dL 9.4 9.1 9.1    CBC Latest Ref Rng 07/19/2015 05/24/2015 03/22/2015  WBC 4.0 - 10.5 K/uL 7.2 6.0 6.0  Hemoglobin 12.0 - 15.0 g/dL 12.1 10.3(L) 10.7(L)  Hematocrit 36.0 - 46.0 % 37.8 32.0(L) 33.1(L)  Platelets 150 - 400 K/uL 402(H) 306 379   TTE 12/10/14:  LVEF 55-60%, concentric LVH, LA mildly dilated CT chest 09/19/14: Bilateral small pulmonary emboli without evidence of right heart strain these are new from the prior exam. No other focal abnormality is noted  CXR 08/17/15: Very small bilateral pleural effusions. No infiltrates. 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 Doubt airways disease 5) Very small bilateral pleural effusions - most likely diagnosis would be CHF  6) Possible diastolic heart failure   PLAN:  1) Check BNP, ESR, ANA today in evaluation 2) trial of Nasacort inhaler - 2 sprays per nostril daily 3) encouraged regular exercise. She raised concern of risk of exertion and I indicated that I believe that exercise would present little risk and much potential benefit 4) OK to remain off Advair (this was prescribed by primary MD but she has not been using it)  5) ROV 6-8 weeks with office spirometry @ that time  Wilhelmina Mcardle, MD [place] [facility], Critical Care Medicine

## 2015-10-02 NOTE — Patient Instructions (Signed)
Lab work today Nasacort nasal inhaler - 2 sprays per nostril each day Follow up in 6-8 weeks with lung function tests performed @ that time

## 2015-10-03 DIAGNOSIS — E785 Hyperlipidemia, unspecified: Secondary | ICD-10-CM | POA: Diagnosis not present

## 2015-10-03 DIAGNOSIS — E119 Type 2 diabetes mellitus without complications: Secondary | ICD-10-CM | POA: Diagnosis not present

## 2015-10-03 DIAGNOSIS — M4800 Spinal stenosis, site unspecified: Secondary | ICD-10-CM | POA: Diagnosis not present

## 2015-10-03 DIAGNOSIS — Z86711 Personal history of pulmonary embolism: Secondary | ICD-10-CM | POA: Diagnosis not present

## 2015-10-03 DIAGNOSIS — I10 Essential (primary) hypertension: Secondary | ICD-10-CM | POA: Diagnosis not present

## 2015-10-03 DIAGNOSIS — Z1329 Encounter for screening for other suspected endocrine disorder: Secondary | ICD-10-CM | POA: Diagnosis not present

## 2015-10-03 DIAGNOSIS — F259 Schizoaffective disorder, unspecified: Secondary | ICD-10-CM | POA: Diagnosis not present

## 2015-10-03 DIAGNOSIS — Z789 Other specified health status: Secondary | ICD-10-CM | POA: Diagnosis not present

## 2015-10-03 DIAGNOSIS — Z79899 Other long term (current) drug therapy: Secondary | ICD-10-CM | POA: Diagnosis not present

## 2015-10-03 DIAGNOSIS — G8929 Other chronic pain: Secondary | ICD-10-CM | POA: Diagnosis not present

## 2015-10-03 LAB — ANTINUCLEAR ANTIBODIES, IFA: ANA Ab, IFA: NEGATIVE

## 2015-10-10 DIAGNOSIS — M654 Radial styloid tenosynovitis [de Quervain]: Secondary | ICD-10-CM | POA: Diagnosis not present

## 2015-10-11 ENCOUNTER — Ambulatory Visit (INDEPENDENT_AMBULATORY_CARE_PROVIDER_SITE_OTHER): Payer: Medicare Other | Admitting: Family Medicine

## 2015-10-11 ENCOUNTER — Other Ambulatory Visit (HOSPITAL_COMMUNITY)
Admission: RE | Admit: 2015-10-11 | Discharge: 2015-10-11 | Disposition: A | Discharge status: Home / Self Care | Payer: Medicare Other | Source: Ambulatory Visit | Attending: Family Medicine | Admitting: Family Medicine

## 2015-10-11 ENCOUNTER — Encounter: Payer: Self-pay | Admitting: Family Medicine

## 2015-10-11 ENCOUNTER — Telehealth: Payer: Self-pay | Admitting: *Deleted

## 2015-10-11 VITALS — BP 128/76 | HR 78 | Temp 98.3°F | Ht 66.0 in | Wt 206.2 lb

## 2015-10-11 DIAGNOSIS — Z113 Encounter for screening for infections with a predominantly sexual mode of transmission: Secondary | ICD-10-CM | POA: Insufficient documentation

## 2015-10-11 DIAGNOSIS — R109 Unspecified abdominal pain: Secondary | ICD-10-CM | POA: Diagnosis not present

## 2015-10-11 DIAGNOSIS — N898 Other specified noninflammatory disorders of vagina: Secondary | ICD-10-CM

## 2015-10-11 DIAGNOSIS — R51 Headache: Secondary | ICD-10-CM

## 2015-10-11 DIAGNOSIS — R3915 Urgency of urination: Secondary | ICD-10-CM

## 2015-10-11 DIAGNOSIS — R079 Chest pain, unspecified: Secondary | ICD-10-CM

## 2015-10-11 DIAGNOSIS — R002 Palpitations: Secondary | ICD-10-CM

## 2015-10-11 DIAGNOSIS — N771 Vaginitis, vulvitis and vulvovaginitis in diseases classified elsewhere: Secondary | ICD-10-CM | POA: Diagnosis not present

## 2015-10-11 DIAGNOSIS — R200 Anesthesia of skin: Secondary | ICD-10-CM

## 2015-10-11 DIAGNOSIS — R519 Headache, unspecified: Secondary | ICD-10-CM

## 2015-10-11 LAB — COMPREHENSIVE METABOLIC PANEL
ALT: 22 U/L (ref 0–35)
AST: 25 U/L (ref 0–37)
Albumin: 4.5 g/dL (ref 3.5–5.2)
Alkaline Phosphatase: 110 U/L (ref 39–117)
BUN: 13 mg/dL (ref 6–23)
CO2: 29 mEq/L (ref 19–32)
Calcium: 9.9 mg/dL (ref 8.4–10.5)
Chloride: 101 mEq/L (ref 96–112)
Creatinine, Ser: 0.73 mg/dL (ref 0.40–1.20)
GFR: 104.05 mL/min (ref >60.00)
Glucose, Bld: 111 mg/dL — ABNORMAL HIGH (ref 70–99)
Potassium: 5 mEq/L (ref 3.5–5.1)
Sodium: 137 mEq/L (ref 135–145)
Total Bilirubin: 0.4 mg/dL (ref 0.2–1.2)
Total Protein: 7.9 g/dL (ref 6.0–8.3)

## 2015-10-11 LAB — POCT URINALYSIS DIPSTICK
Bilirubin, UA: NEGATIVE
Glucose, UA: NEGATIVE
Ketones, UA: NEGATIVE
Leukocytes, UA: NEGATIVE
Nitrite, UA: NEGATIVE
Protein, UA: NEGATIVE
Spec Grav, UA: >=1.03
Urobilinogen, UA: NEGATIVE
pH, UA: 5.5

## 2015-10-11 LAB — CBC
HCT: 36.2 % (ref 36.0–46.0)
Hemoglobin: 11.7 g/dL — ABNORMAL LOW (ref 12.0–15.0)
MCHC: 32.4 g/dL (ref 30.0–36.0)
MCV: 90.5 fl (ref 78.0–100.0)
Platelets: 368 10*3/uL (ref 150.0–400.0)
RBC: 3.99 Mil/uL (ref 3.87–5.11)
RDW: 14.7 % (ref 11.5–15.5)
WBC: 13.6 10*3/uL — ABNORMAL HIGH (ref 4.0–10.5)

## 2015-10-11 LAB — LIPASE: Lipase: <3 U/L — ABNORMAL LOW (ref 11.0–59.0)

## 2015-10-11 LAB — TSH: TSH: 1.04 u[IU]/mL (ref 0.35–4.50)

## 2015-10-11 MED ORDER — POLYETHYLENE GLYCOL 3350 17 GM/SCOOP PO POWD: 17.0000 g | Freq: Two times a day (BID) | ORAL | Status: DC | PRN

## 2015-10-11 NOTE — Telephone Encounter (Signed)
Patient was returning a call for lab results Contact [phone]

## 2015-10-11 NOTE — Patient Instructions (Signed)
Meet you. We will check some lab work today to evaluate your abdominal discomfort. Please follow up with her cardiologist for chest pain. Please follow up with your surgeon for scattered numbness. If you develop chest pain, shortness of breath, sweating, palpitations, worsening headaches, numbness, weakness, vision changes, abdominal pain, blood in her stool, or any new or changing symptoms please seek medical attention.

## 2015-10-11 NOTE — Progress Notes (Signed)
Pre visit review using our clinic review tool, if applicable. No additional management support is needed unless otherwise documented below in the visit note. 

## 2015-10-12 ENCOUNTER — Ambulatory Visit (INDEPENDENT_AMBULATORY_CARE_PROVIDER_SITE_OTHER): Payer: Medicare Other | Admitting: Family Medicine

## 2015-10-12 ENCOUNTER — Encounter: Payer: Self-pay | Admitting: Family Medicine

## 2015-10-12 VITALS — BP 132/84 | HR 89 | Temp 98.4°F | Ht 66.0 in | Wt 206.3 lb

## 2015-10-12 DIAGNOSIS — Z86711 Personal history of pulmonary embolism: Secondary | ICD-10-CM

## 2015-10-12 DIAGNOSIS — K130 Diseases of lips: Secondary | ICD-10-CM

## 2015-10-12 DIAGNOSIS — R220 Localized swelling, mass and lump, head: Secondary | ICD-10-CM

## 2015-10-12 DIAGNOSIS — D649 Anemia, unspecified: Secondary | ICD-10-CM

## 2015-10-12 DIAGNOSIS — F329 Major depressive disorder, single episode, unspecified: Secondary | ICD-10-CM | POA: Diagnosis not present

## 2015-10-12 DIAGNOSIS — R3129 Other microscopic hematuria: Secondary | ICD-10-CM

## 2015-10-12 DIAGNOSIS — F32A Depression, unspecified: Secondary | ICD-10-CM

## 2015-10-12 LAB — WET PREP BY MOLECULAR PROBE
Candida species: POSITIVE — AB
Gardnerella vaginalis: NEGATIVE
Trichomonas vaginosis: NEGATIVE

## 2015-10-12 LAB — URINALYSIS, MICROSCOPIC ONLY: RBC / HPF: NONE SEEN (ref 0–?)

## 2015-10-12 LAB — CERVICOVAGINAL ANCILLARY ONLY
Chlamydia: NEGATIVE
Neisseria Gonorrhea: NEGATIVE

## 2015-10-12 LAB — FERRITIN: Ferritin: 29.3 ng/mL (ref 10.0–291.0)

## 2015-10-12 MED ORDER — EPINEPHRINE 0.3 MG/0.3ML IJ SOAJ: 0.3000 mg | Freq: Once | INTRAMUSCULAR | Status: DC

## 2015-10-12 MED ORDER — FLUCONAZOLE 150 MG PO TABS
150.0000 mg | ORAL_TABLET | ORAL | Status: DC
Start: 2015-10-12 — End: 2015-11-12

## 2015-10-12 NOTE — Progress Notes (Signed)
Pre visit review using our clinic review tool, if applicable. No additional management support is needed unless otherwise documented below in the visit note. 

## 2015-10-12 NOTE — Telephone Encounter (Signed)
Dr Caryl Bis discussed labs with patient at appointment on 10/12/15

## 2015-10-12 NOTE — Patient Instructions (Signed)
Nice to see you. Please get follow-up scheduled with her cardiologist. We will refer you to her hematologist. I sent in Ali Molina for you to take. We will recheck your urine today and your blood counts with iron studies. Please complete the stool cards. If you ever develops swelling of her lips or mouth that progresses or develop trouble breathing or throat swelling please use the EpiPen. If you felt chest pain, shortness of breath, blood in your urine, swelling of the lips, mouth, throat, or any new change in symptoms please seek medical attention.

## 2015-10-13 LAB — URINE CULTURE
Colony Count: NO GROWTH
Organism ID, Bacteria: NO GROWTH

## 2015-10-13 LAB — CBC
HCT: 35.5 % — ABNORMAL LOW (ref 36.0–46.0)
Hemoglobin: 11.4 g/dL — ABNORMAL LOW (ref 12.0–15.0)
MCHC: 32.1 g/dL (ref 30.0–36.0)
MCV: 93.2 fl (ref 78.0–100.0)
Platelets: 431 10*3/uL — ABNORMAL HIGH (ref 150.0–400.0)
RBC: 3.81 Mil/uL — ABNORMAL LOW (ref 3.87–5.11)
RDW: 14.4 % (ref 11.5–15.5)
WBC: 9.7 10*3/uL (ref 4.0–10.5)

## 2015-10-13 LAB — IRON AND TIBC
%SAT: 33 % (ref 11–50)
Iron: 113 ug/dL (ref 45–160)
TIBC: 347 ug/dL (ref 250–450)
UIBC: 234 ug/dL (ref 125–400)

## 2015-10-14 ENCOUNTER — Encounter: Payer: Self-pay | Admitting: Family Medicine

## 2015-10-14 DIAGNOSIS — R079 Chest pain, unspecified: Secondary | ICD-10-CM

## 2015-10-14 DIAGNOSIS — F329 Major depressive disorder, single episode, unspecified: Secondary | ICD-10-CM | POA: Insufficient documentation

## 2015-10-14 DIAGNOSIS — R51 Headache: Secondary | ICD-10-CM

## 2015-10-14 DIAGNOSIS — R109 Unspecified abdominal pain: Secondary | ICD-10-CM | POA: Insufficient documentation

## 2015-10-14 DIAGNOSIS — R220 Localized swelling, mass and lump, head: Secondary | ICD-10-CM | POA: Insufficient documentation

## 2015-10-14 DIAGNOSIS — R519 Headache, unspecified: Secondary | ICD-10-CM | POA: Insufficient documentation

## 2015-10-14 DIAGNOSIS — F32A Depression, unspecified: Secondary | ICD-10-CM | POA: Insufficient documentation

## 2015-10-14 HISTORY — DX: Chest pain, unspecified: R07.9

## 2015-10-14 NOTE — Assessment & Plan Note (Signed)
Mildly anemic. We will give patient stool cards to evaluate for blood in her stool. Given that urine was discarded yesterday will have urine microscopy and urine culture completed today. We'll additionally recheck CBC and will check iron studies.

## 2015-10-14 NOTE — Assessment & Plan Note (Signed)
Stable at this time. Advised that Celexa can cause QT prolongation, though I'm unaware that it can cause T-wave inversions. Advised that her EKG was abnormal already referred her to cardiology. She will continue her Celexa at this time. She'll continue to monitor her depression. She is given return precautions.

## 2015-10-14 NOTE — Assessment & Plan Note (Signed)
Suspect this is an allergic reaction to the toothpaste that she was using. Benign exam today. No trouble breathing when this occurred. Advised to avoid that toothpaste. We will provide her with an EpiPen prescription as well to have on hand given potential for anaphylactoid-like reaction. Given return precautions.

## 2015-10-14 NOTE — Progress Notes (Signed)
Patient ID: Valerie Vazquez, female   DOB: 1956-06-06, 60 y.o.   MRN: [id number]  [name], MD Phone: [phone]  Valerie Vazquez is a 60 y.o. female who presents today for follow-up.  Depression: Patient notes long history of depression. She's been on Celexa for 1-2 years. She was previously on Lexapro. She has no depression at this time. No SI or HI. She wonders whether or not the Celexa could be causing the T-wave inversions on her EKG.  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 Has had them on at least 3 other occasions. First one was provoked, the next several were unprovoked. She has not seen hematology in what sounds like 6 or 7 years. It sounds as though they were unsure of why she continue to get DVTs and PEs. She has an IVC filter and is on Eliquis twice a day. No chest pain or shortness of breath. She's not bleeding from anywhere that she knows of.  Anemia: Patient notes long history of minimal anemia. No iron deficiency or iron supplementation. Not bleeding from anywhere. Reports a colonoscopy in 2014 that was normal. No chest pain, shortness of breath, or lightheadedness. Did have recent palpitations, see previous day's note for work up.  Lip swelling: Patient notes yesterday she developed lip swelling and reportedly mouth swelling with using her toothpaste. She notes her mouth tingled with this. She notes she washed out the toothpaste as quickly as possible and the symptoms got better on their own. No trouble breathing or throat swelling. She states she has had reaction similar to this in the past to other types of things that have gone in her mouth. She's never had an EpiPen.  Patient also noted to have a yeast infection on testing yesterday. Also note urine culture and urine microscopy were not completed due to urine being discarded [place] today.  PMH: Former smoker.   ROS see history of present illness  Objective  Physical Exam Filed Vitals:   10/12/15  1356  BP: 132/84  Pulse: 89  Temp: 98.4 F (36.9 C)    BP Readings from Last 3 Encounters:  10/12/15 132/84  10/11/15 128/76  10/02/15 132/84   Wt Readings from Last 3 Encounters:  10/12/15 206 lb 4.8 oz (93.577 kg)  10/11/15 206 lb 3.2 oz (93.532 kg)  10/02/15 206 lb 12.8 oz (93.804 kg)    Physical Exam  Constitutional: She is well-developed, well-nourished, and in no distress.  HENT:  Head: Normocephalic and atraumatic.  Right Ear: External ear normal.  Left Ear: External ear normal.  Mouth/Throat: Oropharynx is clear and moist. No oropharyngeal exudate.  No lip swelling, tongue swelling, throat lesions, mouth lesions  Eyes: Conjunctivae are normal. Pupils are equal, round, and reactive to light.  Neck: Neck supple. No thyromegaly present.  Cardiovascular: Normal rate, regular rhythm and normal heart sounds.  Exam reveals no gallop and no friction rub.   No murmur heard. Pulmonary/Chest: Effort normal and breath sounds normal. No respiratory distress. She has no wheezes. She has no rales.  Musculoskeletal: She exhibits no edema.  No calf swelling, calf tenderness, or cords in her calves  Lymphadenopathy:    She has no cervical adenopathy.  Neurological: She is alert. Gait normal.  Skin: Skin is warm and dry. She is not diaphoretic.  Psychiatric: Mood and affect normal.     Assessment/Plan: Please see individual problem list.  Depression Stable at this time. Advised that Celexa can cause QT prolongation, though I'm  unaware that it can cause T-wave inversions. Advised that her EKG was abnormal already referred her to cardiology. She will continue her Celexa at this time. She'll continue to monitor her depression. She is given return precautions.  History of pulmonary embolus (PE) Stable on Eliquis. IVC filter in place. No recent recurrence of DVT or PE. Given her history of multiple blood clots an unknown cause we will refer to hematology for further evaluation. She is  given return precautions.  Anemia Mildly anemic. We will give patient stool cards to evaluate for blood in her stool. Given that urine was discarded yesterday will have urine microscopy and urine culture completed today. We'll additionally recheck CBC and will check iron studies.  Lip swelling Suspect this is an allergic reaction to the toothpaste that she was using. Benign exam today. No trouble breathing when this occurred. Advised to avoid that toothpaste. We will provide her with an EpiPen prescription as well to have on hand given potential for anaphylactoid-like reaction. Given return precautions.    Orders Placed This Encounter  Procedures  . Urine Culture  . CBC  . Iron and TIBC  . Ferritin  . Urine Microscopic Only    Meds ordered this encounter  Medications  . fluconazole (DIFLUCAN) 150 MG tablet    Sig: Take 1 tablet (150 mg total) by mouth every 3 (three) days.    Dispense:  2 tablet    Refill:  0  . EPINEPHrine 0.3 mg/0.3 mL IJ SOAJ injection    Sig: Inject 0.3 mLs (0.3 mg total) into the muscle once.    Dispense:  1 Device    Refill:  0     [name]

## 2015-10-14 NOTE — Progress Notes (Signed)
Patient ID: Valerie Vazquez, female   DOB: Nov 29, 1955, 60 y.o.   MRN: [id number]  [name], MD Phone: [phone]  Valerie Vazquez is a 59 y.o. female who presents today for new patient visit.  Patient presents with no specific complaints today. Here to establish care. The following items were mutually agreed upon to discuss today based on her review of systems.  Chest pain: Patient notes intermittently having chest pain for a long time. She states it is likely an esophageal spasm. Occurs intermittently. Last was a few days ago. Sharp in nature. No radiation. No diaphoresis. No shortness of breath with it. She does get some palpitations intermittently. States these are much improved on medications. She does have a history of blood clots in her lungs, though takes Eliquis twice daily and has IVC filter. She also reports she gets fluid in her chest sometimes and has seen pulmonology for this. She is unsure why she gets fluid in her chest with this. No leg swelling. States every time she has gone to the emergency room for this they stated it is not her heart and is likely esophageal spasms. She has no chest pain, shortness of breath, or palpitations at this time. Has a history of coronary vasospasm.  Headaches: Patient notes frequently getting mild headaches intermittently for the last year. They are bitemporal. They have become mildly more frequent. She has a history of ocular migraines. She notes no numbness or weakness with the headaches, though does get scattered numbness in certain spots in her arms and legs for some years. Notes this is stable. She has previously had cervical spine surgery and has known degenerative disc disease. No headaches, numbness, or weakness at this time.  Abdominal pain: Patient notes right-sided abdominal discomfort intermittently for many years. She notes a history of GERD and IBS and intermittently gets bloating and constipation. She does not have bowel  movements daily. She gave herself an enema yesterday. She is also taking Ex-Lax and dicyclomine in the past. She takes chronic narcotics. She also notes some urinary frequency and some urgency intermittently. No dysuria. No diarrhea or vomiting. Some nausea about a week ago that improved with Tums. She has had a partial hysterectomy, cholecystectomy, and appendectomy. She states she does have a cyst on one of her ovaries. She has no abdominal pain at this time.  Active Ambulatory Problems    Diagnosis Date Noted  . Essential hypertension, benign 10/28/2013  . Left hip pain 10/28/2013  . Anemia 10/28/2013  . HLD (hyperlipidemia) 10/28/2013  . Osteoarthritis of left hip 04/11/2014  . Status post left hip replacement 04/11/2014  . IBS (irritable bowel syndrome) 04/11/2014  . GERD (gastroesophageal reflux disease) 04/11/2014  . Unspecified constipation 04/11/2014  . Insomnia 04/11/2014  . Anxiety and depression 04/13/2014  . History of pulmonary embolus (PE) 09/19/2014  . Coronary vasospasm ([place]) 12/09/2014  . Constipation 12/12/2014  . 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 Injury of superior glenoid labrum of shoulder joint 08/16/2014  . Supraspinatus tenosynovitis 08/16/2014  . Cervical spondylosis without myelopathy 08/16/2014  . Impingement syndrome of shoulder 08/30/2014  . History of DVT (deep vein thrombosis) 02/25/2015  . Chronic pain of multiple sites 06/05/2015  . Chronic lumbosacral pain 06/05/2015  . Esophageal spasm 06/05/2015  . Shortness of breath 07/31/2015  . Pleural effusion, bilateral 07/31/2015  . Cough 07/31/2015  . Pulmonary embolism ([place]) 08/15/2015  . Bilateral pneumonia 08/15/2015  . Nausea 08/15/2015  . Chest pain 10/14/2015  . Headache 10/14/2015  .  Abdominal pain 10/14/2015   Resolved Ambulatory Problems    Diagnosis Date Noted  . History of neck surgery 10/28/2013  . Gingivitis 02/26/2015   Past Medical History  Diagnosis Date  . Spinal stenosis   .  Hypertension   . High cholesterol   . Myocardial infarction ([place]) 10/2010 X 3  . Pneumonia 04/2014  . Sleep apnea   . Type II diabetes mellitus ([place])   . History of blood transfusion 1985  . Gastritis   . Gastroenteritis   . Migraine   . Osteoarthritis   . Spondylosis   . Chronic back pain   . Depression   . PTSD (post-traumatic stress disorder)   . Schizoaffective disorder ([place])   . Snoring   . High blood pressure   . Bilateral renal cysts   . Gallstones   . DVT (deep venous thrombosis) ([facility])     Family History  Problem Relation Age of Onset  . Heart attack Neg Hx   . Stroke Mother     Social History   Social History  . Marital Status: Single    Spouse Name: N/A  . Number of Children: N/A  . Years of Education: N/A   Occupational History  . Not on file.   Social History Main Topics  . Smoking status: Former Smoker -- 1.50 packs/day for 10 years    Types: Cigarettes    Quit date: 04/12/1979  . Smokeless tobacco: Never Used  . Alcohol Use: No  . Drug Use: No  . Sexual Activity: Not Currently   Other Topics Concern  . Not on file   Social History Narrative    ROS   General:  Negative for nexplained weight loss, fever Skin: Negative for new or changing mole, sore that won't heal HEENT: Positive for trouble hearing, ringing in ears (since 2006), mouth sores, hoarseness, change in voice, dysphagia (esophageal spasm). Negative for trouble seeing. CV:  Positive for chest pain, dyspnea, edema, palpitations Resp: Positive for cough, Negative for hemoptysis GI: Positive for nausea, constipation, abdominal pain, Negative for vomiting, diarrhea, melena, hematochezia. GU: Positive for incontinence, frequent urination, difficulty keeping stream, breast discomfort, Negative for dysuria, hematuria, vaginal or penile discharge, polyuria, sexual difficulty, lumps in testicle or breasts MSK: Positive for joint pain, Negative for muscle cramps or aches, swelling Neuro:  Positive for headaches, numbness, Negative for weakness, dizziness, passing out/fainting Psych: Negative for depression, anxiety, positive for memory problems  Objective  Physical Exam Filed Vitals:   10/11/15 1047  BP: 128/76  Pulse: 78  Temp: 98.3 F (36.8 C)    BP Readings from Last 3 Encounters:  10/12/15 132/84  10/11/15 128/76  10/02/15 132/84   Wt Readings from Last 3 Encounters:  10/12/15 206 lb 4.8 oz (93.577 kg)  10/11/15 206 lb 3.2 oz (93.532 kg)  10/02/15 206 lb 12.8 oz (93.804 kg)    Physical Exam  Constitutional: She is well-developed, well-nourished, and in no distress.  HENT:  Head: Normocephalic and atraumatic.  Right Ear: External ear normal.  Left Ear: External ear normal.  Mouth/Throat: Oropharynx is clear and moist. No oropharyngeal exudate.  Eyes: Conjunctivae are normal. Pupils are equal, round, and reactive to light.  Neck: Neck supple.  Cardiovascular: Normal rate, regular rhythm and normal heart sounds.  Exam reveals no gallop and no friction rub.   No murmur heard. Pulmonary/Chest: Effort normal and breath sounds normal. No respiratory distress. She has no wheezes. She has no rales.  Abdominal: Soft. Bowel sounds are  normal. She exhibits no distension. There is tenderness (minimal tenderness right mid abdomen, no right lower quadrant tenderness, no right upper quadrant tenderness). There is no rebound and no guarding.  Genitourinary:  Normal labia, normal vaginal mucosa with minimal amount of white creamy discharge, cervix absent and patient status post hysterectomy, no adnexal tenderness or masses  Musculoskeletal: She exhibits no edema.  Lymphadenopathy:    She has no cervical adenopathy.  Neurological: She is alert.  CN 2-12 intact, 5/5 strength in bilateral biceps, triceps, grip, quads, hamstrings, plantar and dorsiflexion, sensation to light touch intact in bilateral UE and LE, normal gait, 2+ patellar reflexes  Skin: Skin is warm and dry.  She is not diaphoretic.  Psychiatric: Mood and affect normal.   EKG: Normal sinus rhythm, rate 71, T-wave inversions in lead 3, aVF, V3, unchanged from previous  Assessment/Plan:   Chest pain Chest pain is atypical in nature. No current chest pain, palpitations, or shortness of breath. EKG is unchanged from previous. EKG does have stable abnormalities. Unlikely to be related to VTE given that she is on Eliquis and has IVC filter. Unlikely to be pulmonary in nature given benign pulmonary exam. Doubt cardiac in nature given atypical nature. Doubt MSK cause. Could be esophageal spasm. Given EKG findings and history chest pain we will refer to cardiology. We will additionally check lab work as outlined below for palpitation cause. Given return precautions.  Headache Tension type headache in a patient with a prior history of ocular migraines. She also reports scattered numbness going on for some time. She is neurologically intact today. Given numbness we will refer to neurology for further evaluation. Given return precautions.  Abdominal pain Patient with chronic intermittent abdominal pain. Suspect this is likely related to constipation. Abdominal exam with minimal tenderness in the right mid abdomen. Benign pelvic exam with the exception of mild white discharge. Wet prep and gonorrhea and chlamydia testing completed. We'll check UA, urine culture, and urine microscopy. She will start on MiraLAX for constipation. We'll check CMP and lipase to rule out other causes. She's had surgical removal of her appendix and gallbladder. She will continue to monitor on the MiraLAX. She's given return precautions.    Orders Placed This Encounter  Procedures  . WET PREP BY MOLECULAR PROBE  . Urine Culture  . CBC  . Comp Met (CMET)  . TSH  . Lipase  . Urine Microscopic Only  . Ambulatory referral to Cardiology    Referral Priority:  Routine    Referral Type:  Consultation    Referral Reason:  Specialty  Services Required    Requested Specialty:  Cardiology    Number of Visits Requested:  1  . Ambulatory referral to Neurology    Referral Priority:  Routine    Referral Type:  Consultation    Referral Reason:  Specialty Services Required    Requested Specialty:  Neurology    Number of Visits Requested:  1  . POCT Urinalysis Dipstick  . EKG 12-Lead    Meds ordered this encounter  Medications  . polyethylene glycol powder (GLYCOLAX/MIRALAX) powder    Sig: Take 17 g by mouth 2 (two) times daily as needed.    Dispense:  3350 g    Refill:  1     [name]

## 2015-10-14 NOTE — Assessment & Plan Note (Signed)
Stable on Eliquis. IVC filter in place. No recent recurrence of DVT or PE. Given her history of multiple blood clots an unknown cause we will refer to hematology for further evaluation. She is given return precautions.

## 2015-10-14 NOTE — Assessment & Plan Note (Addendum)
Chest pain is atypical in nature. No current chest pain, palpitations, or shortness of breath. EKG is unchanged from previous. EKG does have stable abnormalities. Unlikely to be related to VTE given that she is on Eliquis and has IVC filter. Unlikely to be pulmonary in nature given benign pulmonary exam. Doubt cardiac in nature given atypical nature. Doubt MSK cause. Could be esophageal spasm. Given EKG findings and history chest pain we will refer to cardiology. We will additionally check lab work as outlined below for palpitation cause. Given return precautions.

## 2015-10-14 NOTE — Assessment & Plan Note (Signed)
Tension type headache in a patient with a prior history of ocular migraines. She also reports scattered numbness going on for some time. She is neurologically intact today. Given numbness we will refer to neurology for further evaluation. Given return precautions.

## 2015-10-14 NOTE — Assessment & Plan Note (Signed)
Patient with chronic intermittent abdominal pain. Suspect this is likely related to constipation. Abdominal exam with minimal tenderness in the right mid abdomen. Benign pelvic exam with the exception of mild white discharge. Wet prep and gonorrhea and chlamydia testing completed. We'll check UA, urine culture, and urine microscopy. She will start on MiraLAX for constipation. We'll check CMP and lipase to rule out other causes. She's had surgical removal of her appendix and gallbladder. She will continue to monitor on the MiraLAX. She's given return precautions.

## 2015-10-19 ENCOUNTER — Emergency Department (HOSPITAL_COMMUNITY): Payer: Medicare Other

## 2015-10-19 ENCOUNTER — Emergency Department (HOSPITAL_COMMUNITY)
Admission: EM | Admit: 2015-10-19 | Discharge: 2015-10-19 | Disposition: A | Discharge status: Home / Self Care | Payer: Medicare Other | Attending: Emergency Medicine | Admitting: Emergency Medicine

## 2015-10-19 ENCOUNTER — Encounter: Payer: Self-pay | Admitting: Family Medicine

## 2015-10-19 ENCOUNTER — Encounter (HOSPITAL_COMMUNITY): Payer: Self-pay | Admitting: *Deleted

## 2015-10-19 ENCOUNTER — Ambulatory Visit (INDEPENDENT_AMBULATORY_CARE_PROVIDER_SITE_OTHER): Payer: Medicare Other | Admitting: Family Medicine

## 2015-10-19 VITALS — BP 126/70 | HR 85 | Temp 98.6°F | Ht 66.0 in | Wt 206.4 lb

## 2015-10-19 DIAGNOSIS — F329 Major depressive disorder, single episode, unspecified: Secondary | ICD-10-CM | POA: Diagnosis not present

## 2015-10-19 DIAGNOSIS — Z87891 Personal history of nicotine dependence: Secondary | ICD-10-CM | POA: Diagnosis not present

## 2015-10-19 DIAGNOSIS — Z7901 Long term (current) use of anticoagulants: Secondary | ICD-10-CM | POA: Diagnosis not present

## 2015-10-19 DIAGNOSIS — M545 Low back pain, unspecified: Secondary | ICD-10-CM

## 2015-10-19 DIAGNOSIS — M4806 Spinal stenosis, lumbar region: Secondary | ICD-10-CM | POA: Diagnosis not present

## 2015-10-19 DIAGNOSIS — R079 Chest pain, unspecified: Secondary | ICD-10-CM | POA: Diagnosis not present

## 2015-10-19 DIAGNOSIS — Z79899 Other long term (current) drug therapy: Secondary | ICD-10-CM | POA: Insufficient documentation

## 2015-10-19 DIAGNOSIS — R200 Anesthesia of skin: Secondary | ICD-10-CM

## 2015-10-19 DIAGNOSIS — Z7951 Long term (current) use of inhaled steroids: Secondary | ICD-10-CM | POA: Insufficient documentation

## 2015-10-19 DIAGNOSIS — M5126 Other intervertebral disc displacement, lumbar region: Secondary | ICD-10-CM | POA: Diagnosis not present

## 2015-10-19 DIAGNOSIS — R32 Unspecified urinary incontinence: Secondary | ICD-10-CM | POA: Diagnosis not present

## 2015-10-19 DIAGNOSIS — R208 Other disturbances of skin sensation: Secondary | ICD-10-CM

## 2015-10-19 DIAGNOSIS — G8929 Other chronic pain: Secondary | ICD-10-CM | POA: Diagnosis not present

## 2015-10-19 DIAGNOSIS — Q6102 Congenital multiple renal cysts: Secondary | ICD-10-CM | POA: Insufficient documentation

## 2015-10-19 DIAGNOSIS — I252 Old myocardial infarction: Secondary | ICD-10-CM | POA: Insufficient documentation

## 2015-10-19 DIAGNOSIS — Z9889 Other specified postprocedural states: Secondary | ICD-10-CM | POA: Insufficient documentation

## 2015-10-19 DIAGNOSIS — Z8739 Personal history of other diseases of the musculoskeletal system and connective tissue: Secondary | ICD-10-CM | POA: Insufficient documentation

## 2015-10-19 DIAGNOSIS — Z862 Personal history of diseases of the blood and blood-forming organs and certain disorders involving the immune mechanism: Secondary | ICD-10-CM | POA: Diagnosis not present

## 2015-10-19 DIAGNOSIS — R002 Palpitations: Secondary | ICD-10-CM

## 2015-10-19 DIAGNOSIS — Z86711 Personal history of pulmonary embolism: Secondary | ICD-10-CM | POA: Insufficient documentation

## 2015-10-19 DIAGNOSIS — I10 Essential (primary) hypertension: Secondary | ICD-10-CM | POA: Insufficient documentation

## 2015-10-19 DIAGNOSIS — E119 Type 2 diabetes mellitus without complications: Secondary | ICD-10-CM | POA: Insufficient documentation

## 2015-10-19 DIAGNOSIS — Z86718 Personal history of other venous thrombosis and embolism: Secondary | ICD-10-CM | POA: Insufficient documentation

## 2015-10-19 DIAGNOSIS — K589 Irritable bowel syndrome without diarrhea: Secondary | ICD-10-CM | POA: Insufficient documentation

## 2015-10-19 DIAGNOSIS — R159 Full incontinence of feces: Secondary | ICD-10-CM | POA: Insufficient documentation

## 2015-10-19 DIAGNOSIS — K219 Gastro-esophageal reflux disease without esophagitis: Secondary | ICD-10-CM | POA: Diagnosis not present

## 2015-10-19 DIAGNOSIS — Z8701 Personal history of pneumonia (recurrent): Secondary | ICD-10-CM | POA: Diagnosis not present

## 2015-10-19 DIAGNOSIS — N644 Mastodynia: Secondary | ICD-10-CM

## 2015-10-19 MED ORDER — ONDANSETRON HCL 4 MG/2ML IJ SOLN
4.0000 mg | Freq: Once | INTRAMUSCULAR | Status: AC
  Administered 2015-10-19: 4 mg via INTRAVENOUS
  Filled 2015-10-19: qty 2

## 2015-10-19 MED ORDER — OXYCODONE-ACETAMINOPHEN 5-325 MG PO TABS: 1.0000 | ORAL_TABLET | Freq: Two times a day (BID) | ORAL | Status: DC | PRN

## 2015-10-19 NOTE — ED Notes (Signed)
chronuic back pain for years.  She has had more difficulty with pain in her back and numbness and pain in both legs and lower back with bowel and bladder issues  She was seen by a doctor today and she was sent here

## 2015-10-19 NOTE — Patient Instructions (Signed)
Nice to see you. Please go to the emergency room for evaluation and MRI of your back. If you develop numbness or weakness in her legs, worsening back pain, bowel or bladder incontinence, or any new or changing symptoms in route to the emergency room please call 911.

## 2015-10-19 NOTE — Progress Notes (Signed)
Pre visit review using our clinic review tool, if applicable. No additional management support is needed unless otherwise documented below in the visit note. 

## 2015-10-19 NOTE — Assessment & Plan Note (Signed)
Atypical. Reproducible on exam today. Associated with palpitations. EKG checked today unchanged from previous. No chest pain at this time. Palpitations last occurred several days ago. Suspect MSK cause for her chest discomfort versus esophageal spasm. Patient is already on anticoagulation so unlikely to be VTE. Given lack of changes in EKG unlikely be ischemic in nature. Could also be related to palpitations. He has follow-up with cardiology on Monday. She will keep this. She's given return precautions.

## 2015-10-19 NOTE — Assessment & Plan Note (Signed)
Bilateral breast discomfort. Benign exam today. We'll order a mammogram.

## 2015-10-19 NOTE — Progress Notes (Signed)
Patient ID: Valerie Vazquez, female   DOB: 01/17/1956, 60 y.o.   MRN: [id number]  [name], MD Phone: [phone]  Valerie Vazquez is a 60 y.o. female who presents today for follow-up.  Chronic pain: Patient notes chronic right hip pain and back pain. Back pain is in her low back. States it feels like headache. Notes no numbness in her legs at this time. She does note saddle anesthesia. This has been progressive over the last month or so. She also notes over the last several weeks she has had small amounts of stool in her underwear without knowing it. She notes difficulty urinating as well. No weakness in her legs though they may have given out several days ago. 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  Palpitations: Patient notes several days ago she developed a fluttering sensation in her chest. She notes when this occurs she can point to a specific spot in her chest with one finger that feels as though somebody is pushing in that area. No widespread chest pain. No shortness of breath or diaphoresis with this. It is dull and lasts seconds at a time. Has not occurred in the last several days. No chest pain, shortness breath, or palpitations at this time.  Patient notes breast discomfort for a number of months that is achy in nature and feels like when she was pregnant. She notes no masses in her breasts. No nipple discharge.  PMH: Former smoker.   ROS the history of present illness  Objective  Physical Exam Filed Vitals:   10/19/15 1402  BP: 126/70  Pulse: 85  Temp: 98.6 F (37 C)    Physical Exam  Constitutional: She is well-developed, well-nourished, and in no distress.  HENT:  Head: Normocephalic and atraumatic.  Right Ear: External ear normal.  Left Ear: External ear normal.  Mouth/Throat: Oropharynx is clear and moist. No oropharyngeal exudate.  Eyes: Conjunctivae are normal. Pupils are equal, round, and reactive to  light.  Cardiovascular: Normal rate, regular rhythm and normal heart sounds.  Exam reveals no gallop and no friction rub.   No murmur heard. Pulmonary/Chest: Effort normal and breath sounds normal. No respiratory distress. She has no wheezes. She has no rales.  Bilateral breast with no tenderness or masses or skin changes, left mid costochondral joint with pinpoint tenderness  Musculoskeletal: She exhibits no edema.  No midline spine tenderness, no midline spine step-off, no muscular back tenderness  Neurological: She is alert.  CN 2-12 intact, 5/5 strength in bilateral biceps, triceps, grip, quads, hamstrings, plantar and dorsiflexion, sensation to light touch intact in bilateral UE and LE, normal gait, 2+ patellar reflexes, decreased sensation in perineum, decreased rectal tone  Skin: Skin is warm and dry. She is not diaphoretic.   EKG: Normal sinus rhythm, rate 73, negative T waves in leads 3 and V3  Assessment/Plan: Please see individual problem list.  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 Her exam is significant for decreased sensation in the saddle area and decreased rectal tone. The concern is that she could have a lesion compressing her spinal cord or cauda equina. This has been slowly progressive over the last several weeks. We attempted to schedule an outpatient MRI to be done this afternoon, though this was unable to be coordinated by imaging centers in the  area. Given this the patient needs evaluation in the emergency room for an MRI of her lumbar spine to ensure that there is no spinal compression. Patient preferred to go to [facility] in [place] and thus a CMA will call the charge nurse and let them know that the patient is on her way. Return precautions given.   I have provided the patient with a  refill of her chronic narcotics and advised her that she will need to see a pain management specialist in the area for future refills.   Chest pain Atypical. Reproducible on exam today. Associated with palpitations. EKG checked today unchanged from previous. No chest pain at this time. Palpitations last occurred several days ago. Suspect MSK cause for her chest discomfort versus esophageal spasm. Patient is already on anticoagulation so unlikely to be VTE. Given lack of changes in EKG unlikely be ischemic in nature. Could also be related to palpitations. He has follow-up with cardiology on Monday. She will keep this. She's given return precautions.   Breast pain Bilateral breast discomfort. Benign exam today. We'll order a mammogram.    Orders Placed This Encounter  Procedures  . MM Digital Diagnostic Bilat    Standing Status: Future     Number of Occurrences:      Standing Expiration Date: 12/16/2016    Order Specific Question:  Reason for Exam (SYMPTOM  OR DIAGNOSIS REQUIRED)    Answer:  bilateral breast pain    Order Specific Question:  Preferred imaging location?    Answer:  [facility]  . EKG 12-Lead    Meds ordered this encounter  Medications  . oxyCODONE-acetaminophen (PERCOCET/ROXICET) 5-325 MG tablet    Sig: Take 1 tablet by mouth 2 (two) times daily as needed for severe pain.    Dispense:  60 tablet    Refill:  0    [name]

## 2015-10-19 NOTE — ED Notes (Signed)
She was given a perco et in her doctors office

## 2015-10-19 NOTE — ED Provider Notes (Signed)
CSN: [id number]     Arrival date & time 10/19/15  1723 History   First MD Initiated Contact with Patient 10/19/15 2012     Chief Complaint  Patient presents with  . Back Pain     (Consider location/radiation/quality/duration/timing/severity/associated sxs/prior Treatment) HPI Comments: Patient is a 60 year old female with a history of spinal stenosis, hypertension, pulmonary embolus, MI, diabetes mellitus, IBS, esophageal reflux, chronic back pain, depression, PTSD, and schizoaffective disorder. She presents to the emergency department for evaluation of worsening low back pain. She reports a history of chronic back pain 4 years. Over the past month, patient has noted progressive saddle anesthesia and numbness around her sacrum. She followed up with her primary care doctor today who requested evaluation in the ED for MRI as patient has been complaining of some stool incontinence and urinary incontinence. She states that "the stool gets to my rectum and it's like my rectum doesn't know what to do with it". She also states that she needs to apply pressure to her suprapubic abdomen to feel as though she full empties her bladder. Patient reports her left leg "giving out" a few days ago. She states that she needed to "drag it" until her symptoms resolved. She "paid it no mind" as she has a hx of L hip surgery and thought that her symptoms were related to this. 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 Physical exam at PCP today noted decreased rectal tone. Patient denies a hx of CA and IVDU.  Patient is a 60 y.o. female presenting with back pain. The history is provided by the patient. No language interpreter was used.  Back Pain Associated symptoms: no fever     Past Medical History  Diagnosis Date  . Spinal stenosis   . Hypertension   . Pulmonary embolism ([place]) 04/2001; 09/19/2014    "after gallbladder OR; "  . High cholesterol   . Myocardial infarction ([place]) 10/2010 X 3    "while  hospitalized"  . Pneumonia 04/2014  . Sleep apnea     "mild" (09/19/2014)  . Type II diabetes mellitus ([place])     "dx'd in 2007; lost weight; no RX for ~ 4 yr now" (09/19/2014)  . Anemia   . History of blood transfusion 1985    related to hysterectomy  . Esophageal spasm   . Gastritis   . Gastroenteritis   . IBS (irritable bowel syndrome)   . GERD (gastroesophageal reflux disease)   . Headache     "couple /month lately" (09/19/2014)  . Migraine     "a few times/yr" (09/19/2014)  . Osteoarthritis     "qwhere; mostly around my joints" (09/19/2014)  . Spondylosis   . Chronic back pain     "upper and lower" (09/19/2014)  . Depression     "major" (09/19/2014)  . PTSD (post-traumatic stress disorder)   . Schizoaffective disorder ([place])   . Snoring     a. sleep study 5/16: No OSA  . High blood pressure   . Bilateral renal cysts   . Gallstones   . DVT (deep venous thrombosis) [facility])    Past Surgical History  Procedure Laterality Date  . Excision/release bursa hip Right 12/1984  . Total hip arthroplasty Left 04-06-2014  . Tonsillectomy and adenoidectomy  ~ 1968  . Appendectomy  1985  . Abdominal hysterectomy  1985  . Cholecystectomy  2002  . Hernia repair  1985  . Umbilical hernia repair  1985  . Vena cava filter  placement  03/2014  . Back surgery    . Anterior cervical decomp/discectomy fusion  10/2012  . Breast cyst excision Right   . Cardiac catheterization   2000's; 2009  . Left heart catheterization with coronary angiogram N/A 12/09/2014    Procedure: LEFT HEART CATHETERIZATION WITH CORONARY ANGIOGRAM;  Surgeon: Burnell Blanks, MD;  Location: [facility] CATH LAB;  Service: Cardiovascular;  Laterality: N/A;   Family History  Problem Relation Age of Onset  . Heart attack Neg Hx   . Stroke Mother    Social History  Substance Use Topics  . Smoking status: Former Smoker -- 1.50 packs/day for 10 years    Types: Cigarettes    Quit date: 04/12/1979  . Smokeless tobacco: Never Used   . Alcohol Use: No   OB History    No data available      Review of Systems  Constitutional: Negative for fever.  Genitourinary:       +incontinence  Musculoskeletal: Positive for back pain.  All other systems reviewed and are negative.   Allergies  Abilify; Augmentin; Bactrim; Ceftin; Diclofenac; Indocin; Linaclotide; Lisinopril; Morphine; Morphine and related; Simvastatin; Sulfa antibiotics; Sulfamethoxazole-trimethoprim; Wellbutrin; and Quetiapine fumarate  Home Medications   Prior to Admission medications   Medication Sig Start Date End Date Taking? Authorizing Provider  ADVAIR DISKUS 250-50 MCG/DOSE AEPB Reported on 10/02/2015 08/02/15   Historical Provider, MD  apixaban (ELIQUIS) 5 MG TABS tablet Take 1 tablet (5 mg total) by mouth 2 (two) times daily. 12/12/14   Rhonda G Barrett, PA-C  BIOTIN PO Take 1 tablet by mouth daily.    Historical Provider, MD  calcium carbonate (TUMS - DOSED IN MG ELEMENTAL CALCIUM) 500 MG chewable tablet Chew 1 tablet by mouth daily as needed for indigestion or heartburn.    Historical Provider, MD  carvedilol (COREG) 3.125 MG tablet Take 1 tablet (3.125 mg total) by mouth 2 (two) times daily with a meal. 01/08/15   Liliane Shi, PA-C  cyclobenzaprine (FLEXERIL) 10 MG tablet Take 1 tablet (10 mg total) by mouth 3 (three) times daily as needed for muscle spasms. 08/13/15   [name], MD  dicyclomine (BENTYL) 10 MG capsule TAKE 2 CAPSULES BY MOUTH EVERY SIX HOURS 04/09/15   [name], MD  diltiazem (CARDIZEM CD) 120 MG 24 hr capsule Take 1 capsule (120 mg total) by mouth daily. 09/28/15   [name], MD  diphenhydrAMINE (BENADRYL) 25 MG tablet Take 1-2 tablets (25-50 mg total) by mouth every 6 (six) hours as needed for itching ([place]). 07/14/15   Antonietta Breach, PA-C  EPINEPHrine 0.3 mg/0.3 mL IJ SOAJ injection Inject 0.3 mLs (0.3 mg total) into the muscle once. 10/12/15   [name], MD  escitalopram (LEXAPRO) 10 MG tablet TAKE 1 TABLET  BY MOUTH EVERY DAY 10/01/15   [name], MD  famotidine (PEPCID) 20 MG tablet Take 1 tablet (20 mg total) by mouth 2 (two) times daily. Take for itching 07/14/15   Antonietta Breach, PA-C  fluconazole (DIFLUCAN) 150 MG tablet Take 1 tablet (150 mg total) by mouth every 3 (three) days. 10/12/15   [name], MD  furosemide (LASIX) 20 MG tablet TAKE 1 TABLET BY MOUTH EVERY DAY 08/14/15   [name], MD  gabapentin (NEURONTIN) 600 MG tablet Take 1 tablet (600 mg total) by mouth every 8 (eight) hours. 04/24/15   [name], MD  hydrOXYzine (ATARAX/VISTARIL) 25 MG tablet Take 1 tablet (25 mg total) by mouth every 6 (six) hours  as needed for itching. 07/14/15   Antonietta Breach, PA-C  isosorbide mononitrate (IMDUR) 30 MG 24 hr tablet Take 1 tablet (30 mg total) by mouth daily. 03/22/15   Burnell Blanks, MD  Multiple Vitamin (MULTIVITAMIN WITH MINERALS) TABS tablet Take 2 tablets by mouth daily.     Historical Provider, MD  ondansetron (ZOFRAN) 4 MG tablet Take 1 tablet (4 mg total) by mouth every 8 (eight) hours as needed for nausea or vomiting. 08/15/15   [name], MD  oxyCODONE-acetaminophen (PERCOCET/ROXICET) 5-325 MG tablet Take 1 tablet by mouth 2 (two) times daily as needed for severe pain. 10/19/15   [name], MD  pantoprazole (PROTONIX) 40 MG tablet TAKE ONE TABLET BY MOUTH EVERY DAY 09/03/15   [name], MD  polyethylene glycol powder (GLYCOLAX/MIRALAX) powder Take 17 g by mouth 2 (two) times daily as needed. 10/11/15   [name], MD  potassium chloride (K-DUR) 10 MEQ tablet Take 1 tablet (10 mEq total) by mouth 2 (two) times daily. 07/19/15   Tanna Furry, MD  Sennosides (EX-LAX) 15 MG CHEW Chew 1 tablet by mouth daily as needed. For constipation    Historical Provider, MD  traZODone (DESYREL) 50 MG tablet Take 1 tablet (50 mg total) by mouth at bedtime. Patient taking differently: Take 50 mg by mouth at bedtime as needed for sleep.  04/24/15   [name], MD  triamcinolone (NASACORT AQ) 55 MCG/ACT AERO nasal inhaler Place 2 sprays into the nose daily. 10/02/15   Wilhelmina Mcardle, MD   BP 132/77 mmHg  Pulse 74  Temp(Src) 98.3 F (36.8 C)  Resp 16  Ht 5\' 7"  (1.702 m)  Wt 95.057 kg  BMI 32.81 kg/m2  SpO2 95%   Physical Exam  Constitutional: She is oriented to person, place, and time. She appears well-developed and well-nourished. No distress.  Nontoxic/nonseptic appearing  HENT:  Head: Normocephalic and atraumatic.  Eyes: Conjunctivae and EOM are normal. No scleral icterus.  Neck: Normal range of motion.  Cardiovascular: Normal rate, regular rhythm and intact distal pulses.   DP and PT pulses 2+ in BLE  Pulmonary/Chest: Effort normal. No respiratory distress.  Respirations even and unlabored  Musculoskeletal: Normal range of motion.  No TTP to the lumbosacral midline. No bony deformities, step offs, or crepitus.  Neurological: She is alert and oriented to person, place, and time. She exhibits normal muscle tone. Coordination normal.  Patellar and achilles reflexes 2+ in  BLE. Patient reports decreased sensation to sacrum/coccygeal region. Sensation in BLE intact and at baseline, per patient.  Skin: Skin is warm and dry. No rash noted. She is not diaphoretic. No erythema. No pallor.  Psychiatric: She has a normal mood and affect. Her behavior is normal.  Nursing note and vitals reviewed.   ED Course  Procedures (including critical care time) Labs Review Labs Reviewed - No data to display  Imaging Review Mr Lumbar Spine Wo Contrast  10/19/2015  CLINICAL DATA:  Back pain, numbness and pain in bilateral lower extremities. Bowel and bladder issues. EXAM: MRI LUMBAR SPINE WITHOUT CONTRAST TECHNIQUE: Multiplanar, multisequence MR imaging of the lumbar spine was performed. No intravenous contrast was administered. COMPARISON:  CT abdomen and pelvis October 28, 2014 FINDINGS: Lumbar vertebral bodies and posterior elements are  intact and aligned with maintenance of lumbar lordosis. Using the reference level of the last well-formed intervertebral disc as L5-S1, decreased T2 signal within the L4-5 disc, to lesser extent the remaining lumbar disc compatible with mild  desiccation, lumbar disc morphology is maintained. Minimal chronic discogenic endplate changes [id number] through L4-5. No STIR signal abnormality to suggest acute osseous process. Conus medullaris terminates at L1-2 and appears normal morphology and signal characteristics. Cauda equina is normal. Multiple partially imaged sacrum meningeal cysts, mildly expanding the sacral canal. Level by level evaluation: L1-2: No disc bulge. Moderate facet arthropathy and ligamentum flavum redundancy without canal stenosis or neural foraminal narrowing. L2-3: No disc bulge. Moderate to severe facet arthropathy and ligamentum flavum redundancy result in very mild canal stenosis without neural foraminal narrowing. L3-4: No disc bulge. Moderate to severe facet arthropathy and ligamentum flavum redundancy result in very mild canal stenosis without neural foraminal narrowing. L4-5: Small 2 mm broad-based disc bulge. Severe facet arthropathy and ligamentum flavum redundancy with trace facet effusions which are likely reactive. Mild to moderate canal stenosis, partial effacement of the RIGHT lateral recess which could affect the traversing RIGHT L5 nerve. Moderate RIGHT neural foraminal narrowing, very mild on the LEFT. L5-S1: No disc bulge. Severe facet arthropathy without canal stenosis or neural foraminal narrowing. 4 mm RIGHT perineural cyst. IMPRESSION: No acute lumbar spine fracture or malalignment. Advanced facet arthropathy. Mild to moderate canal stenosis at L4-5, very mild at L2-3 and L3-4. Moderate RIGHT L4-5 neural foraminal narrowing. Multiple partially imaged sacral meningeal cyst. Electronically Signed   By: Elon Alas M.D.   On: 10/19/2015 22:05     I have personally reviewed and  evaluated these images and lab results as part of my medical decision-making.   EKG Interpretation None      MDM   Final diagnoses:  Low back pain    60 year old female presents to the emergency department for evaluation of low back pain with with progressive bowel and urinary changes and reports of subjective decreased sensation to her low back and perineal region x 1 month. Patient has been able to ambulate without difficulty. No history of cancer or IV drug use. Patient is neurovascularly intact on exam today. MRI obtained given reported history. Imaging findings show no acute lumbar spine fracture or malalignment. Cauda equina is normal. No evidence of emergent, surgical, or concerning process to explain patient's symptoms today.  Imaging findings reviewed with the patient at bedside. She verbalizes understanding. I have recommended that she follow up, again, with her PCP regarding her symptoms, especially if they persist or worsen. Patient has percocet at home for pain management. In light of chronicity of symptoms and reassuring work up, no indication for further emergent work up at this time. Patient discharged in good condition with no unaddressed concerns; VSS.   Filed Vitals:   10/19/15 2045 10/19/15 2100 10/19/15 2215 10/19/15 2230  BP: 124/78 121/78 124/75 137/77  Pulse: 77 71 76 79  Temp:      Resp:      Height:      Weight:      SpO2: 99% 99% 97% 95%      Antonietta Breach, PA-C 10/19/15 2308  Dorie Rank, MD 10/20/15 1622

## 2015-10-19 NOTE — Discharge Instructions (Signed)
Continue taking Percocet as prescribed for your back pain. Discuss with your primary care doctor starting another course of steroids, since you are unable to take Ibuprofen or Aleve due to the use of blood thinners. Return to the ED, as needed, if symptoms worsen.  Back Pain, Adult Back pain is very common in adults.The cause of back pain is rarely dangerous and the pain often gets better over time.The cause of your back pain may not be known. Some common causes of back pain include:  Strain of the muscles or ligaments supporting the spine.  Wear and tear (degeneration) of the spinal disks.  Arthritis.  Direct injury to the back. For many people, back pain may return. Since back pain is rarely dangerous, most people can learn to manage this condition on their own. HOME CARE INSTRUCTIONS Watch your back pain for any changes. The following actions may help to lessen any discomfort you are feeling:  Remain active. It is stressful on your back to sit or stand in one place for long periods of time. Do not sit, drive, or stand in one place for more than 30 minutes at a time. Take short walks on even surfaces as soon as you are able.Try to increase the length of time you walk each day.  Exercise regularly as directed by your health care provider. Exercise helps your back heal faster. It also helps avoid future injury by keeping your muscles strong and flexible.  Do not stay in bed.Resting more than 1-2 days can delay your recovery.  Pay attention to your body when you bend and lift. The most comfortable positions are those that put less stress on your recovering back. Always use proper lifting techniques, including:  Bending your knees.  Keeping the load close to your body.  Avoiding twisting.  Find a comfortable position to sleep. Use a firm mattress and lie on your side with your knees slightly bent. If you lie on your back, put a pillow under your knees.  Avoid feeling anxious or  stressed.Stress increases muscle tension and can worsen back pain.It is important to recognize when you are anxious or stressed and learn ways to manage it, such as with exercise.  Take medicines only as directed by your health care provider. Over-the-counter medicines to reduce pain and inflammation are often the most helpful.Your health care provider may prescribe muscle relaxant drugs.These medicines help dull your pain so you can more quickly return to your normal activities and healthy exercise.  Apply ice to the injured area:  Put ice in a plastic bag.  Place a towel between your skin and the bag.  Leave the ice on for 20 minutes, 2-3 times a day for the first 2-3 days. After that, ice and heat may be alternated to reduce pain and spasms.  Maintain a healthy weight. Excess weight puts extra stress on your back and makes it difficult to maintain good posture. SEEK MEDICAL CARE IF:  You have pain that is not relieved with rest or medicine.  You have increasing pain going down into the legs or buttocks.  You have pain that does not improve in one week.  You have night pain.  You lose weight.  You have a fever or chills. SEEK IMMEDIATE MEDICAL CARE IF:   You develop new bowel or bladder control problems.  You develop fever over 100.31F  You have unusual weakness or numbness in your arms or legs.  You develop nausea or vomiting.  You develop abdominal pain.  You feel faint.   This information is not intended to replace advice given to you by your health care provider. Make sure you discuss any questions you have with your health care provider.   Document Released: 08/18/2005 Document Revised: 09/08/2014 Document Reviewed: 12/20/2013 Elsevier Interactive Patient Education [employer].

## 2015-10-19 NOTE — Assessment & Plan Note (Addendum)
Patient with chronic lumbar back pain with known meningeal cyst and spinal stenosis now with reported progressive numbness in the saddle region and loss of bowel function intermittently. Her exam is significant for decreased sensation in the saddle area and decreased rectal tone. The concern is that she could have a lesion compressing her spinal cord or cauda equina. This has been slowly progressive over the last several weeks. We attempted to schedule an outpatient MRI to be done this afternoon, though this was unable to be coordinated by imaging centers in the area. Given this the patient needs evaluation in the emergency room for an MRI of her lumbar spine to ensure that there is no spinal compression. Patient preferred to go to [facility] in [place] and thus a CMA will call the charge nurse and let them know that the patient is on her way. Return precautions given.   I have provided the patient with a refill of her chronic narcotics and advised her that she will need to see a pain management specialist in the area for future refills.

## 2015-10-22 ENCOUNTER — Encounter: Payer: Self-pay | Admitting: Cardiovascular Disease

## 2015-10-22 ENCOUNTER — Encounter: Payer: Self-pay | Admitting: *Deleted

## 2015-10-22 ENCOUNTER — Ambulatory Visit (INDEPENDENT_AMBULATORY_CARE_PROVIDER_SITE_OTHER): Payer: Medicare Other | Admitting: Cardiovascular Disease

## 2015-10-22 VITALS — BP 132/72 | HR 74 | Ht 67.0 in | Wt 206.4 lb

## 2015-10-22 DIAGNOSIS — I201 Angina pectoris with documented spasm: Secondary | ICD-10-CM | POA: Diagnosis not present

## 2015-10-22 DIAGNOSIS — E785 Hyperlipidemia, unspecified: Secondary | ICD-10-CM

## 2015-10-22 DIAGNOSIS — I10 Essential (primary) hypertension: Secondary | ICD-10-CM | POA: Diagnosis not present

## 2015-10-22 DIAGNOSIS — I5032 Chronic diastolic (congestive) heart failure: Secondary | ICD-10-CM

## 2015-10-22 MED ORDER — CARVEDILOL 3.125 MG PO TABS: 3.1250 mg | ORAL_TABLET | Freq: Two times a day (BID) | ORAL | Status: DC

## 2015-10-22 MED ORDER — POTASSIUM CHLORIDE ER 10 MEQ PO TBCR: 10.0000 meq | EXTENDED_RELEASE_TABLET | Freq: Two times a day (BID) | ORAL | Status: DC

## 2015-10-22 NOTE — Patient Instructions (Signed)

## 2015-10-22 NOTE — Progress Notes (Signed)
Chief Complaint  Patient presents with  . Follow-up    pt c/o heart pounding when lying on right side   History of Present Illness: 60 yo female with history of schizoaffective disorder, DM, HTN, diastolic CHF and multiple episodes of PE/DVT now on Eliquis with IVC filter who is here today for follow up. She was admitted to [facility] in [place] 2016 with chest pain. Her initial EKG was normal but she had dynamic ST changes when chest pain recurred so a code STEMI was called. Emergent cardiac cath with no evidence of CAD. Troponin was negative. She was started on Diltiazem and Imdur for possible vasospasm.   She is here today for follow up. She is feeling well. No chest pain, SOB, palpitations, near syncope or syncope.   Primary Care Physician: [name], MD   Past Medical History  Diagnosis Date  . Spinal stenosis   . Hypertension   . Pulmonary embolism ([place]) 04/2001; 09/19/2014    "after gallbladder OR; "  . High cholesterol   . Myocardial infarction ([place]) 10/2010 X 3    "while hospitalized"  . Pneumonia 04/2014  . Sleep apnea     "mild" (09/19/2014)  . Type II diabetes mellitus ([place])     "dx'd in 2007; lost weight; no RX for ~ 4 yr now" (09/19/2014)  . Anemia   . History of blood transfusion 1985    related to hysterectomy  . Esophageal spasm   . Gastritis   . Gastroenteritis   . IBS (irritable bowel syndrome)   . GERD (gastroesophageal reflux disease)   . Headache     "couple /month lately" (09/19/2014)  . Migraine     "a few times/yr" (09/19/2014)  . Osteoarthritis     "qwhere; mostly around my joints" (09/19/2014)  . Spondylosis   . Chronic back pain     "upper and lower" (09/19/2014)  . Depression     "major" (09/19/2014)  . PTSD (post-traumatic stress disorder)   . Schizoaffective disorder ([place])   . Snoring     a. sleep study 5/16: No OSA  . High blood pressure   . Bilateral renal cysts   . Gallstones   . DVT (deep venous thrombosis) [facility])     Past Surgical  History  Procedure Laterality Date  . Excision/release bursa hip Right 12/1984  . Total hip arthroplasty Left 04-06-2014  . Tonsillectomy and adenoidectomy  ~ 1968  . Appendectomy  1985  . Abdominal hysterectomy  1985  . Cholecystectomy  2002  . Hernia repair  1985  . Umbilical hernia repair  1985  . Vena cava filter placement  03/2014  . Back surgery    . Anterior cervical decomp/discectomy fusion  10/2012  . Breast cyst excision Right   . Cardiac catheterization   2000's; 2009  . Left heart catheterization with coronary angiogram N/A 12/09/2014    Procedure: LEFT HEART CATHETERIZATION WITH CORONARY ANGIOGRAM;  Surgeon: Burnell Blanks, MD;  Location[facility] CATH LAB;  Service: Cardiovascular;  Laterality: N/A;    Current Outpatient Prescriptions  Medication Sig Dispense Refill  . apixaban (ELIQUIS) 5 MG TABS tablet Take 1 tablet (5 mg total) by mouth 2 (two) times daily. 60 tablet 3  . BIOTIN PO Take 1 tablet by mouth daily.    . calcium carbonate (TUMS - DOSED IN MG ELEMENTAL CALCIUM) 500 MG chewable tablet Chew 1 tablet by mouth daily as needed for indigestion or heartburn.    . carvedilol (COREG) 3.125  MG tablet Take 1 tablet (3.125 mg total) by mouth 2 (two) times daily with a meal. 60 tablet 11  . cyclobenzaprine (FLEXERIL) 10 MG tablet Take 1 tablet (10 mg total) by mouth 3 (three) times daily as needed for muscle spasms. 100 tablet 5  . dicyclomine (BENTYL) 10 MG capsule TAKE 2 CAPSULES BY MOUTH EVERY SIX HOURS 240 capsule 5  . diltiazem (CARDIZEM CD) 120 MG 24 hr capsule Take 1 capsule (120 mg total) by mouth daily. 90 capsule 1  . diphenhydrAMINE (BENADRYL) 25 MG tablet Take 1-2 tablets (25-50 mg total) by mouth every 6 (six) hours as needed for itching (Rash). 30 tablet 0  . EPINEPHrine 0.3 mg/0.3 mL IJ SOAJ injection Inject 0.3 mLs (0.3 mg total) into the muscle once. 1 Device 0  . escitalopram (LEXAPRO) 10 MG tablet TAKE 1 TABLET BY MOUTH EVERY DAY 30 tablet 5  . famotidine  (PEPCID) 20 MG tablet Take 1 tablet (20 mg total) by mouth 2 (two) times daily. Take for itching 10 tablet 0  . fluconazole (DIFLUCAN) 150 MG tablet Take 1 tablet (150 mg total) by mouth every 3 (three) days. 2 tablet 0  . furosemide (LASIX) 20 MG tablet TAKE 1 TABLET BY MOUTH EVERY DAY 90 tablet 2  . gabapentin (NEURONTIN) 600 MG tablet Take 1 tablet (600 mg total) by mouth every 8 (eight) hours. 90 tablet 5  . hydrOXYzine (ATARAX/VISTARIL) 25 MG tablet Take 1 tablet (25 mg total) by mouth every 6 (six) hours as needed for itching. 12 tablet 0  . isosorbide mononitrate (IMDUR) 30 MG 24 hr tablet Take 1 tablet (30 mg total) by mouth daily. 30 tablet 11  . Multiple Vitamin (MULTIVITAMIN WITH MINERALS) TABS tablet Take 2 tablets by mouth daily.     . ondansetron (ZOFRAN) 4 MG tablet Take 1 tablet (4 mg total) by mouth every 8 (eight) hours as needed for nausea or vomiting. 90 tablet 5  . oxyCODONE-acetaminophen (PERCOCET/ROXICET) 5-325 MG tablet Take 1 tablet by mouth 2 (two) times daily as needed for severe pain. 60 tablet 0  . pantoprazole (PROTONIX) 40 MG tablet TAKE ONE TABLET BY MOUTH EVERY DAY 30 tablet 5  . polyethylene glycol powder (GLYCOLAX/MIRALAX) powder Take 17 g by mouth 2 (two) times daily as needed. 3350 g 1  . potassium chloride (K-DUR) 10 MEQ tablet Take 1 tablet (10 mEq total) by mouth 2 (two) times daily. 60 tablet 11  . traZODone (DESYREL) 50 MG tablet Take 1 tablet (50 mg total) by mouth at bedtime. (Patient taking differently: Take 50 mg by mouth at bedtime as needed for sleep. ) 30 tablet 5  . triamcinolone (NASACORT AQ) 55 MCG/ACT AERO nasal inhaler Place 2 sprays into the nose daily. 1 Inhaler 12   No current facility-administered medications for this visit.    Allergies  Allergen Reactions  . Abilify [Aripiprazole] Other (See Comments)    Jerking movement, slow motor skills  . Augmentin [Amoxicillin-Pot Clavulanate] Diarrhea and Nausea And Vomiting  . Bactrim  [Sulfamethoxazole-Trimethoprim] Diarrhea  . Ceftin [Cefuroxime Axetil] Diarrhea  . Coconut Oil     Rash   . Diclofenac Other (See Comments)     Muscle Pain  . Indocin [Indomethacin] Other (See Comments)    Migraines   . Linaclotide Diarrhea  . Lisinopril Diarrhea and Other (See Comments)    Other reaction(s): Dizziness, Vomiting  . Morphine Other (See Comments)    Chest Tightness  . Morphine And Related Other (See Comments)  Chest tightness   . Simvastatin Other (See Comments)    Muscle Pain  . Sulfa Antibiotics Diarrhea  . Sulfamethoxazole-Trimethoprim Diarrhea  . Wellbutrin [Bupropion] Other (See Comments)    Jerking movement Slurred speech  . Quetiapine Fumarate Palpitations    Social History   Social History  . Marital Status: Single    Spouse Name: N/A  . Number of Children: N/A  . Years of Education: N/A   Occupational History  . Not on file.   Social History Main Topics  . Smoking status: Former Smoker -- 1.50 packs/day for 10 years    Types: Cigarettes    Quit date: 04/12/1979  . Smokeless tobacco: Never Used  . Alcohol Use: No  . Drug Use: No  . Sexual Activity: Not Currently   Other Topics Concern  . Not on file   Social History Narrative    Family History  Problem Relation Age of Onset  . Heart attack Neg Hx   . Stroke Mother     Review of Systems:  As stated in the HPI and otherwise negative.   BP 132/72 mmHg  Pulse 74  Ht 5\' 7"  (1.702 m)  Wt 206 lb 6.4 oz (93.622 kg)  BMI 32.32 kg/m2  SpO2 97%  Physical Examination: General: Well developed, well nourished, NAD HEENT: OP clear, mucus membranes moist SKIN: warm, dry. No rashes. Neuro: No focal deficits Musculoskeletal: Muscle strength 5/5 all ext Psychiatric: Mood and affect normal Neck: No JVD, no carotid bruits, no thyromegaly, no lymphadenopathy. Lungs:Clear bilaterally, no wheezes, rhonci, crackles Cardiovascular: Regular rate and rhythm. No murmurs, gallops or  rubs. Abdomen:Soft. Bowel sounds present. Non-tender.  Extremities: No lower extremity edema. Pulses are 2 + in the bilateral DP/PT.  EKG:  EKG is not ordered today. The ekg ordered today demonstrates   Recent Labs: 01/08/2015: Pro B Natriuretic peptide (BNP) 49.0 10/02/2015: B Natriuretic Peptide 38.0 10/11/2015: ALT 22; BUN 13; Creatinine, Ser 0.73; Potassium 5.0; Sodium 137; TSH 1.04 10/12/2015: Hemoglobin 11.4*; Platelets 431.0*   Lipid Panel No results found for: CHOL, TRIG, HDL, CHOLHDL, VLDL, LDLCALC, LDLDIRECT   Wt Readings from Last 3 Encounters:  10/22/15 206 lb 6.4 oz (93.622 kg)  10/19/15 209 lb 9 oz (95.057 kg)  10/19/15 206 lb 6.4 oz (93.622 kg)     Other studies Reviewed: Additional studies/ records that were reviewed today include: . Review of the above records demonstrates:    Assessment and Plan:   1. Coronary vasospasm: She has tolerated Cardizem and Imdur well. No recent chest pains. Will continue current therapy.  2. Essential hypertension, benign: BP is controlled. No changes today.   3. HLD: Managed by primary care.  4. History of Pulmonary embolism: She remains on Eliquis. This is managed by primary care.  5. Chronic diastolic CHF: Weight is stable. She uses Lasix as needed.   Current medicines are reviewed at length with the patient today.  The patient does not have concerns regarding medicines.  The following changes have been made:  no change  Labs/ tests ordered today include:  No orders of the defined types were placed in this encounter.    Disposition:   FU with me in 12 months.   Signed, Lauree Chandler, MD 10/22/2015 9:42 AM    [facility] [address], [place], [place]  [address] Phone: [phone]; Fax: [phone]

## 2015-10-28 ENCOUNTER — Other Ambulatory Visit: Payer: Self-pay | Admitting: Family Medicine

## 2015-10-29 ENCOUNTER — Other Ambulatory Visit: Payer: Self-pay | Admitting: Family Medicine

## 2015-10-31 ENCOUNTER — Other Ambulatory Visit: Payer: Self-pay | Admitting: Surgical

## 2015-10-31 DIAGNOSIS — Z1231 Encounter for screening mammogram for malignant neoplasm of breast: Secondary | ICD-10-CM

## 2015-11-07 DIAGNOSIS — M654 Radial styloid tenosynovitis [de Quervain]: Secondary | ICD-10-CM | POA: Diagnosis not present

## 2015-11-08 ENCOUNTER — Encounter: Payer: Self-pay | Admitting: Family Medicine

## 2015-11-08 IMAGING — CR DG HIP COMPLETE 2+V*L*
1 series · 3 of 3 positions shown · non-contrast
Comparison: 10/12/2013

CLINICAL DATA: Pain

EXAM:
LEFT HIP - COMPLETE 2+ VIEW

[Series 2: t hip ap left · 0.14mm/px · 3 of 3 slices shown]
[im 1/3]
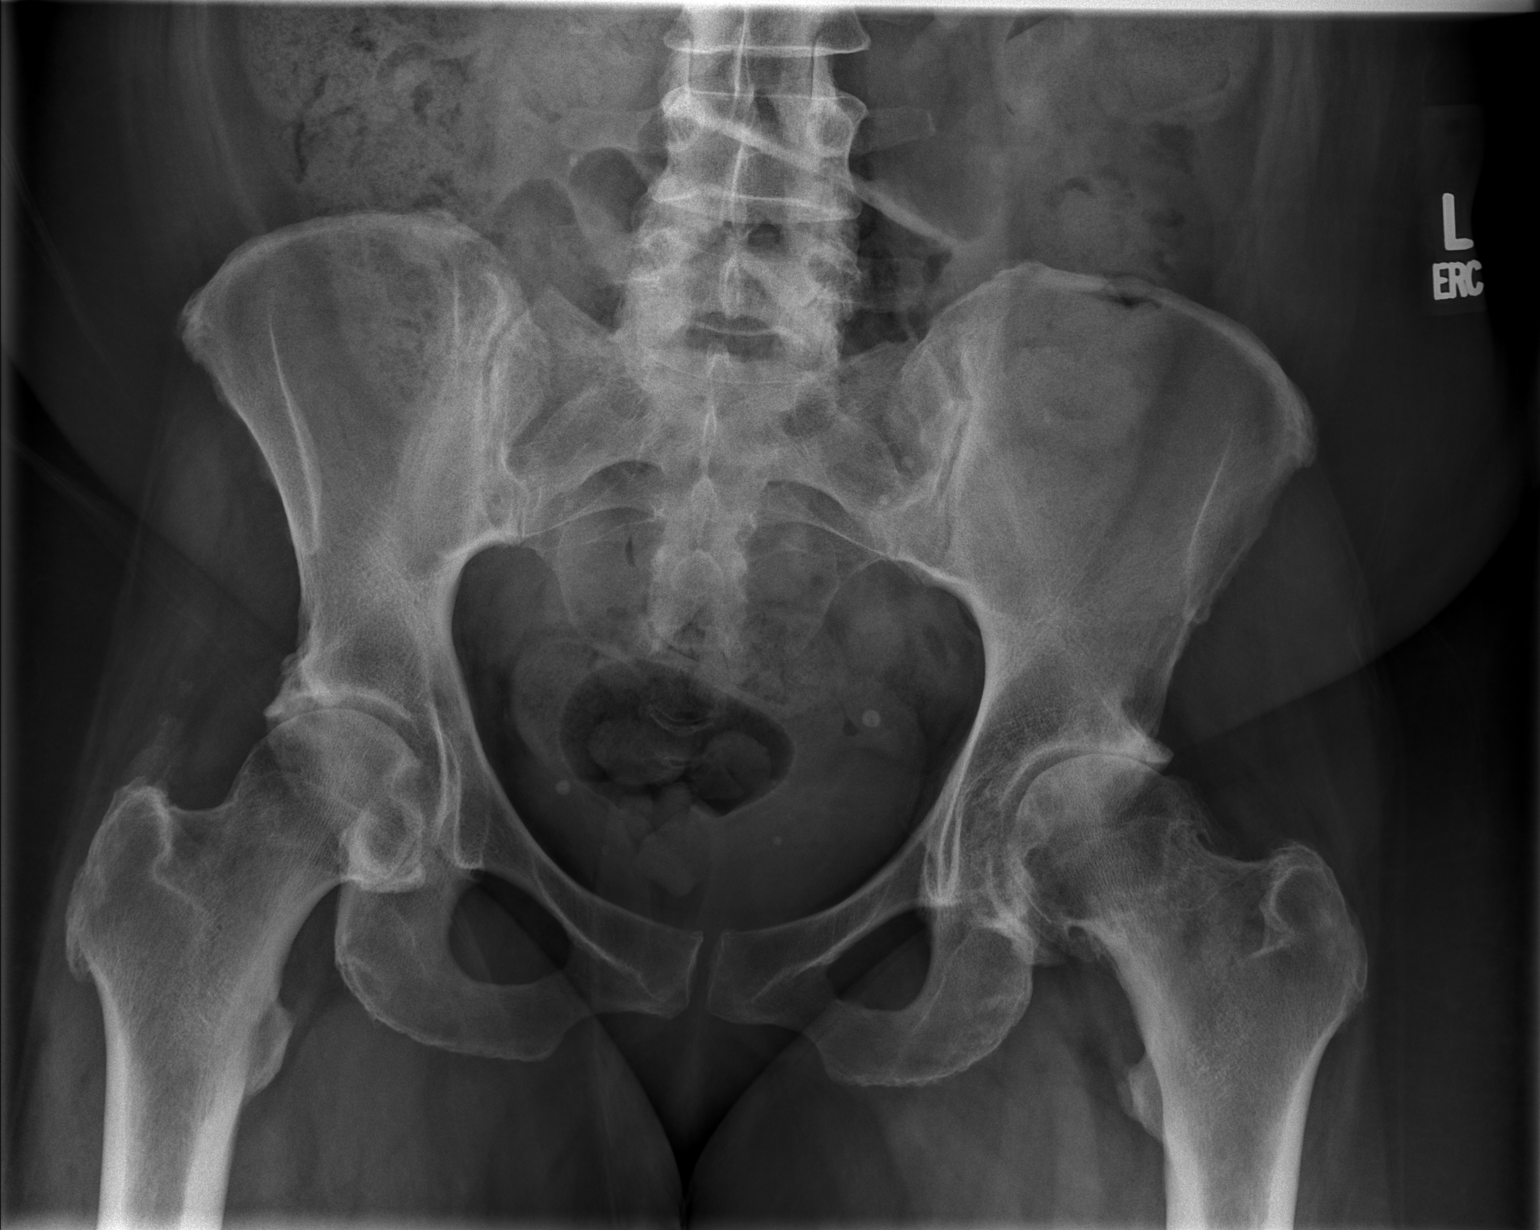
[im 2/3]
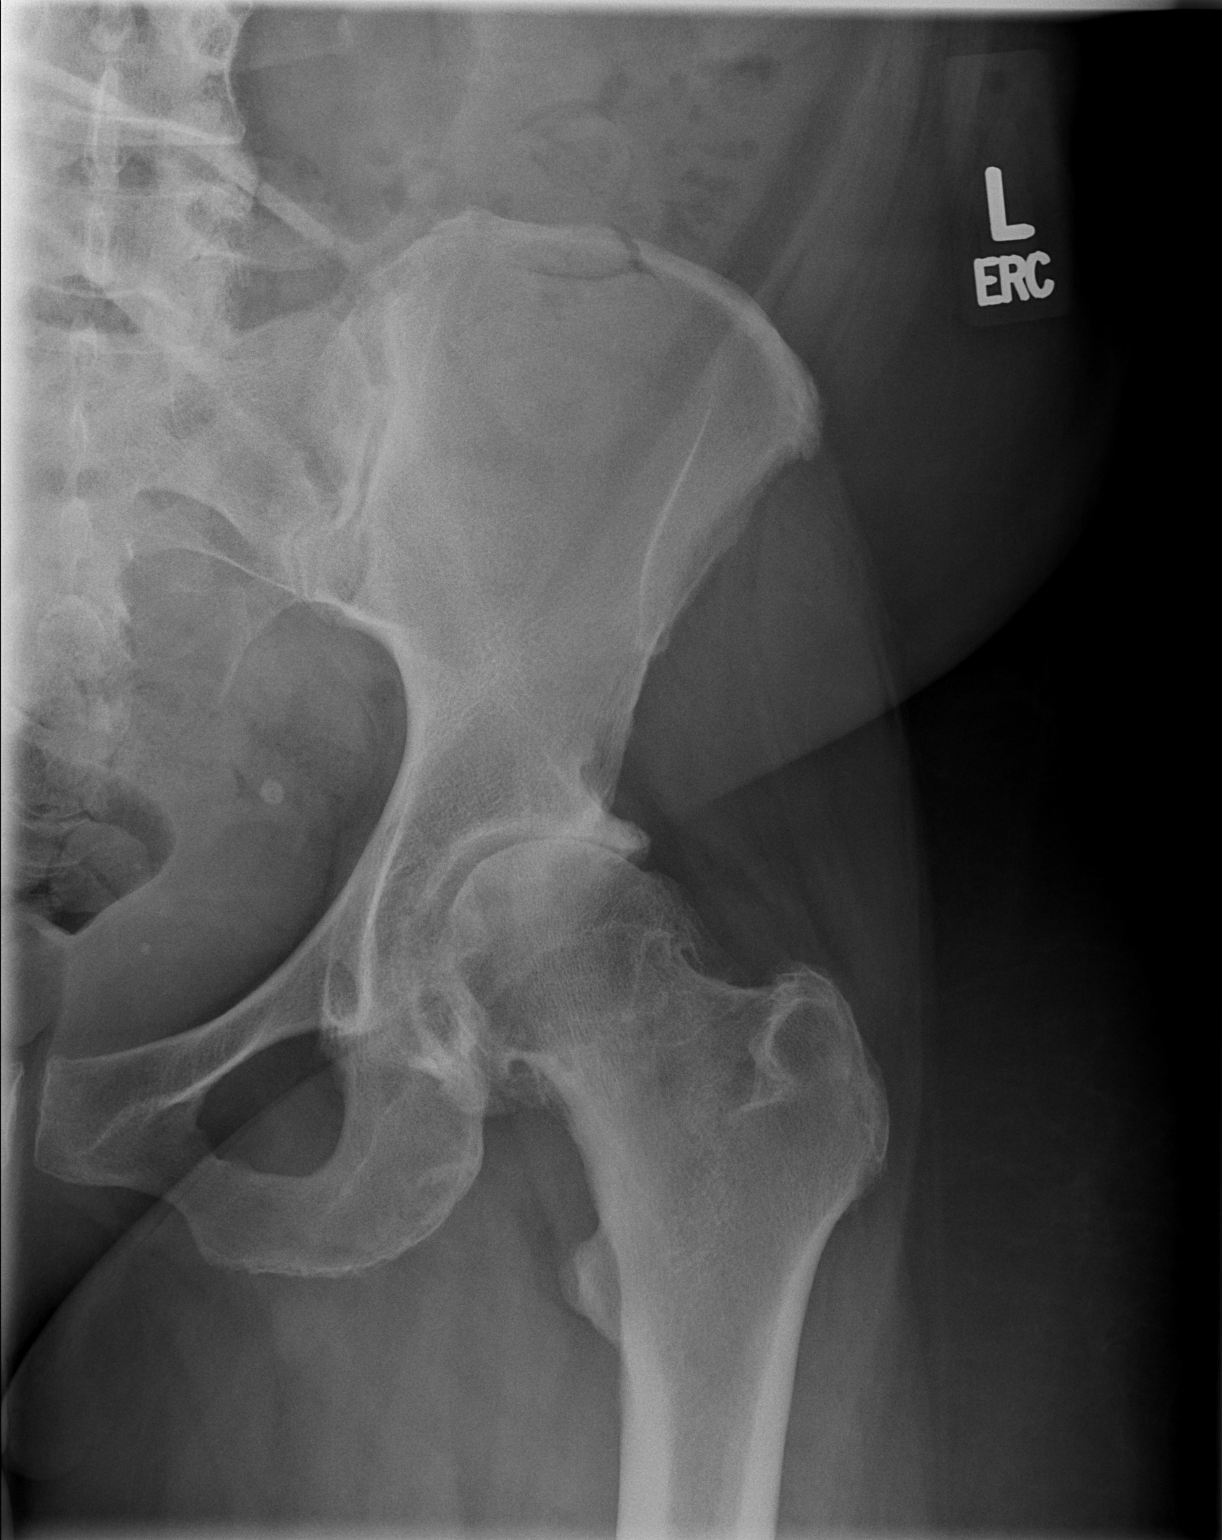
[im 3/3]
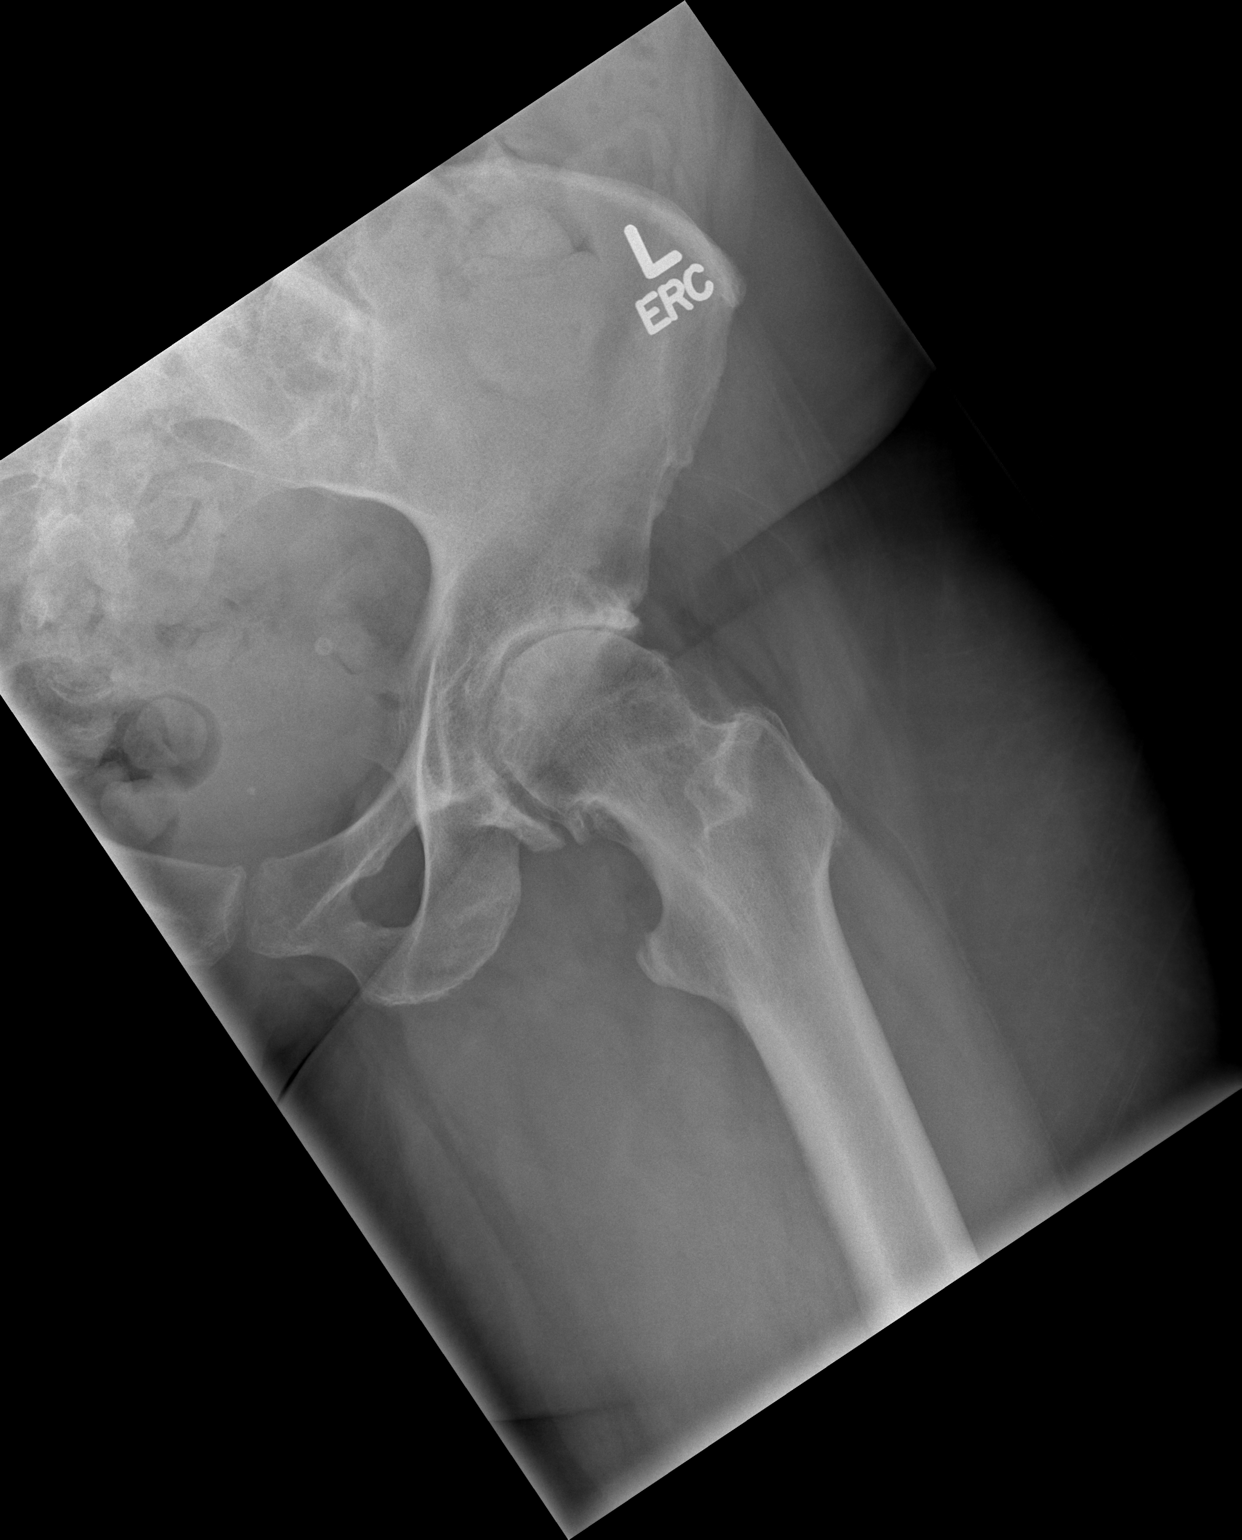

[3 of 3 positions shown; findings below may reference images not displayed]

FINDINGS: No fracture or dislocation is seen.

Moderate degenerative changes of the left hip.

Visualized bony pelvis appears intact.
IMPRESSION: Moderate degenerative changes of the left hip, unchanged.

## 2015-11-09 ENCOUNTER — Encounter: Payer: Self-pay | Admitting: Family Medicine

## 2015-11-09 ENCOUNTER — Ambulatory Visit (INDEPENDENT_AMBULATORY_CARE_PROVIDER_SITE_OTHER): Payer: Medicare Other | Admitting: Family Medicine

## 2015-11-09 VITALS — BP 118/64 | HR 89 | Temp 98.3°F | Ht 66.0 in | Wt 215.6 lb

## 2015-11-09 DIAGNOSIS — I82409 Acute embolism and thrombosis of unspecified deep veins of unspecified lower extremity: Secondary | ICD-10-CM

## 2015-11-09 DIAGNOSIS — Z86711 Personal history of pulmonary embolism: Secondary | ICD-10-CM | POA: Diagnosis not present

## 2015-11-09 DIAGNOSIS — M47812 Spondylosis without myelopathy or radiculopathy, cervical region: Secondary | ICD-10-CM

## 2015-11-09 DIAGNOSIS — K219 Gastro-esophageal reflux disease without esophagitis: Secondary | ICD-10-CM

## 2015-11-09 DIAGNOSIS — M545 Low back pain, unspecified: Secondary | ICD-10-CM

## 2015-11-09 DIAGNOSIS — G8929 Other chronic pain: Secondary | ICD-10-CM

## 2015-11-09 DIAGNOSIS — M25531 Pain in right wrist: Secondary | ICD-10-CM

## 2015-11-09 DIAGNOSIS — I201 Angina pectoris with documented spasm: Secondary | ICD-10-CM | POA: Diagnosis not present

## 2015-11-09 IMAGING — CT CT ANGIO CHEST
2 of 6 series · 18 of 36 positions shown · IV contrast (APPLIED)
Comparison: Chest radiograph, 09/24/2013

CLINICAL DATA: Chest pain. Cough. Hypotension. Remote history of
DVT.

EXAM:
CT ANGIOGRAPHY CHEST WITH CONTRAST
TECHNIQUE: Multidetector CT imaging of the chest was performed using the
standard protocol during bolus administration of intravenous
contrast. Multiplanar CT image reconstructions and MIPs were
obtained to evaluate the vascular anatomy.
CONTRAST:  75 mL of Isovue 370 intravenous contrast

[Series 5: pe 1.0 thins · axial · 0.68mm/px · z∈[-296,-56]mm · 17 of 272 slices shown]
[im 16/272  lung]
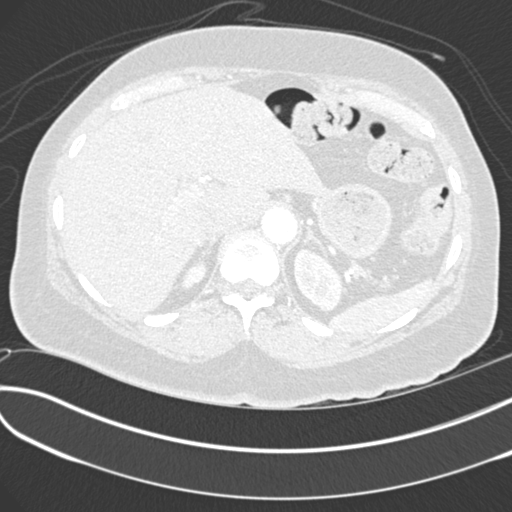
[im 31/272  mediastinal]
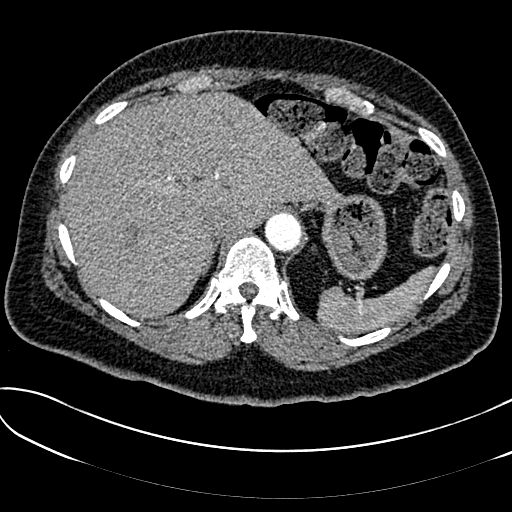
[im 46/272  lung]
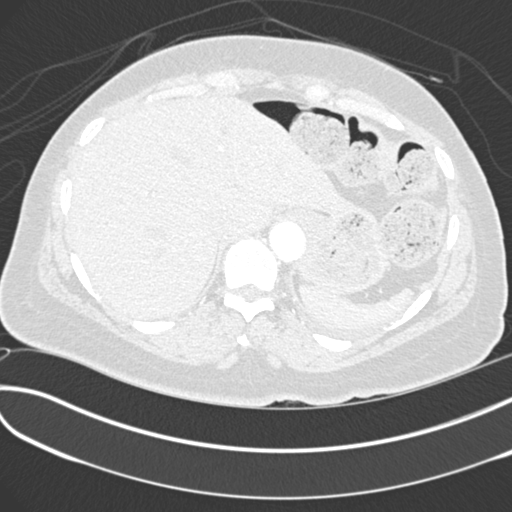
[im 61/272  mediastinal]
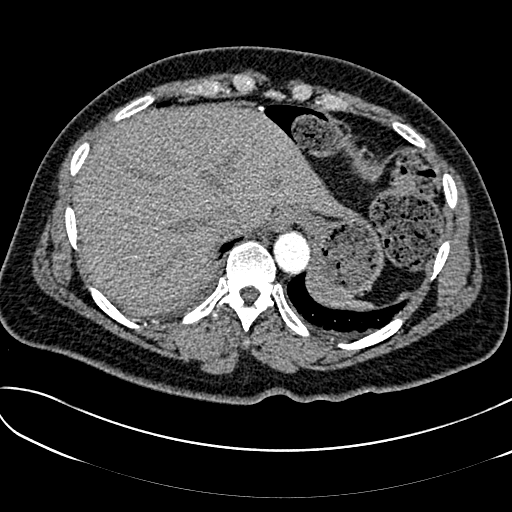
[im 76/272  lung]
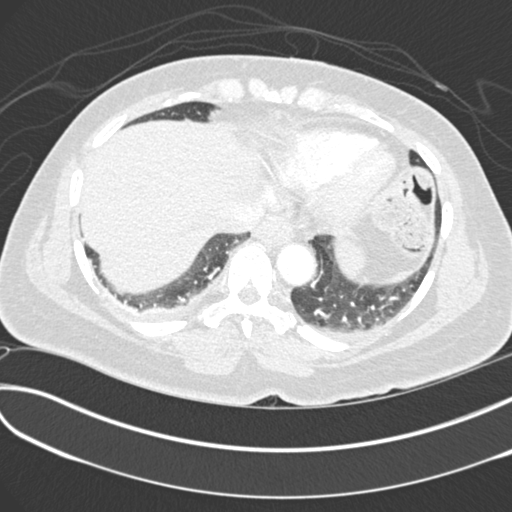
[im 91/272  mediastinal]
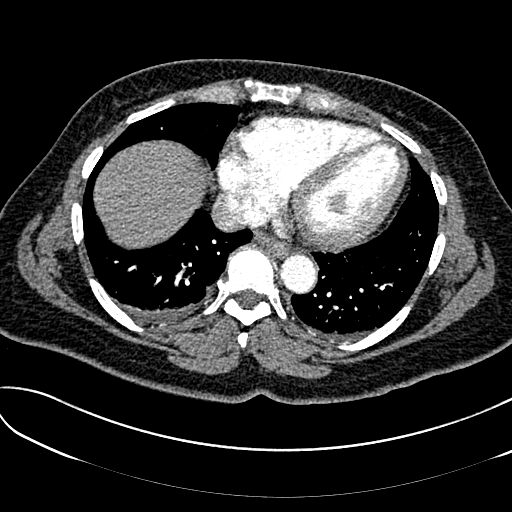
[im 106/272  lung]
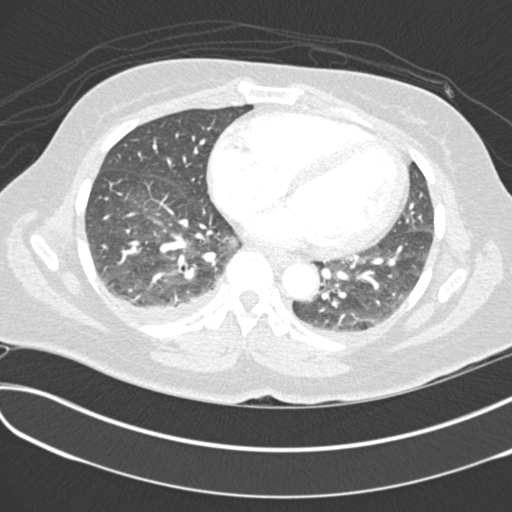
[im 121/272  mediastinal]
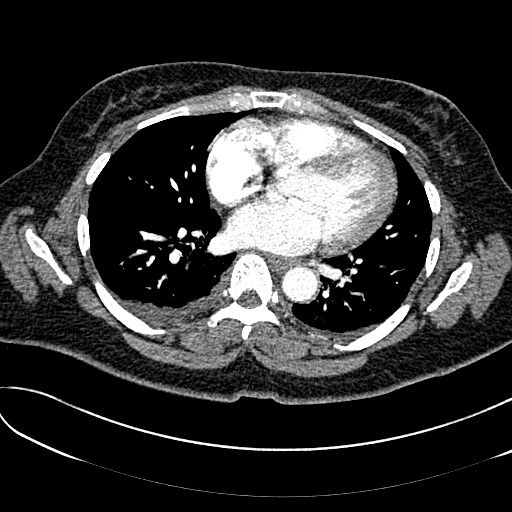
[im 136/272  lung]
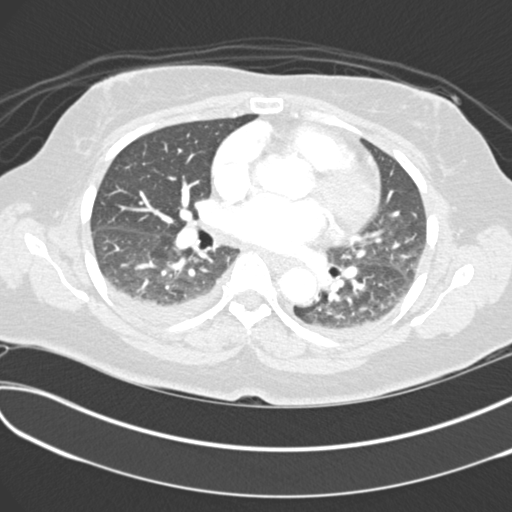
[im 151/272  mediastinal]
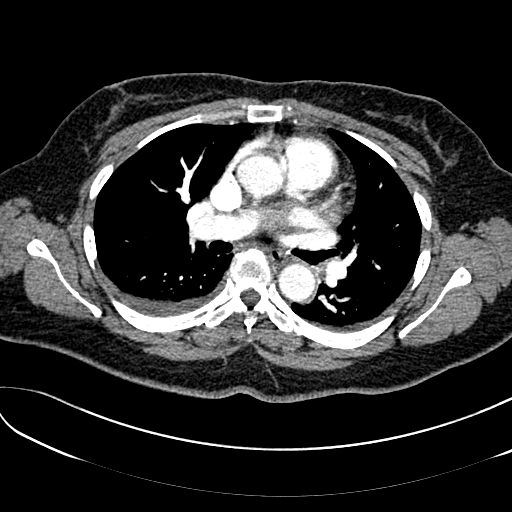
[im 166/272  lung]
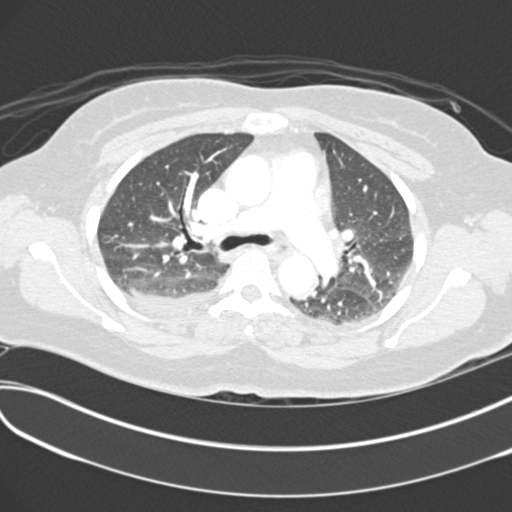
[im 181/272  mediastinal]
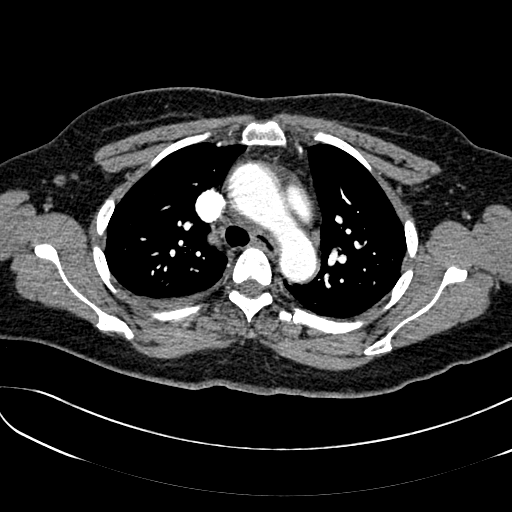
[im 196/272  lung]
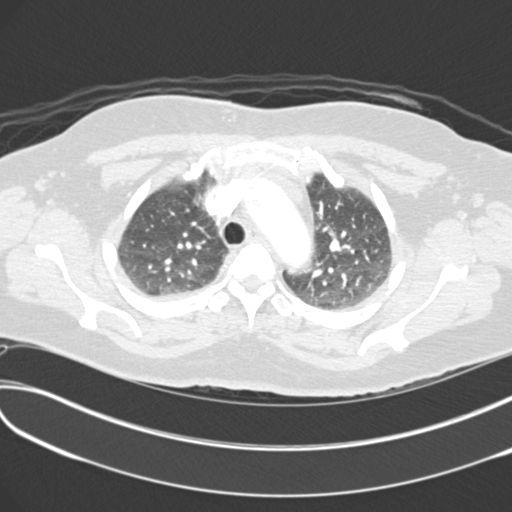
[im 211/272  mediastinal]
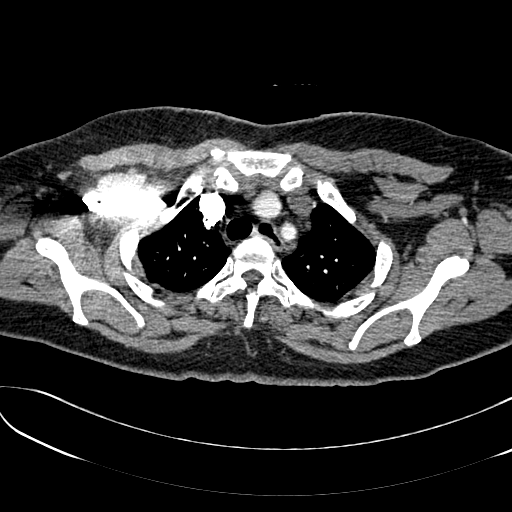
[im 226/272  lung]
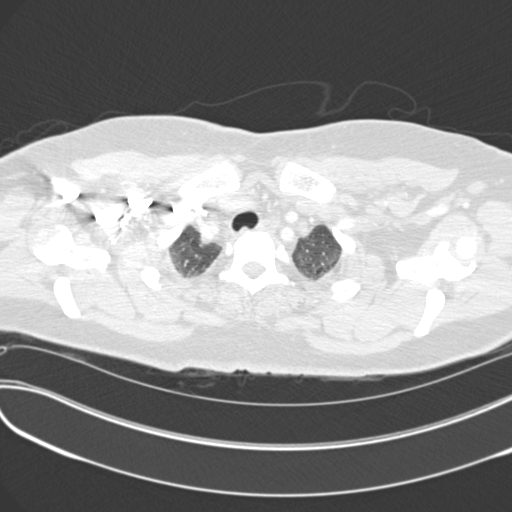
[im 241/272  mediastinal]
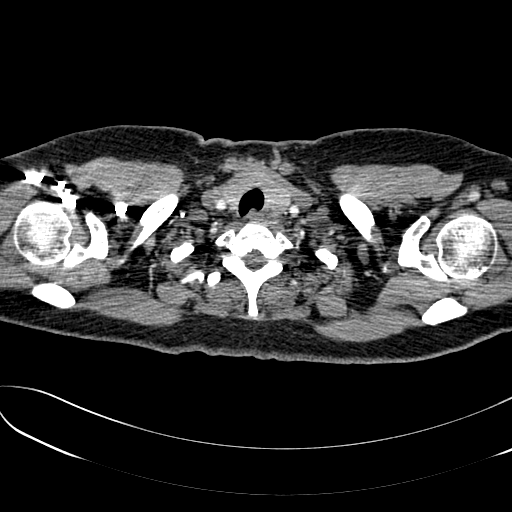
[im 256/272  lung]
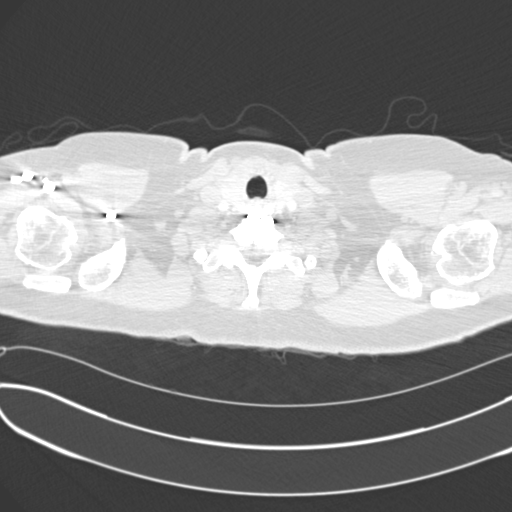

[Series 7: cor pe 2.0 mpr · coronal · 0.53mm/px · 1 of 104 slices shown]
[im 52/104  mediastinal]
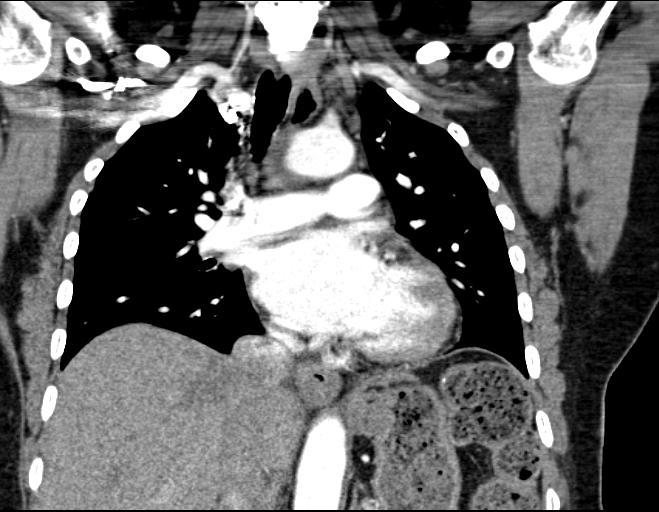

[18 of 36 positions shown; findings below may reference images not displayed]

FINDINGS: No evidence of a pulmonary embolus.

Heart is mildly enlarged. Great vessels are normal in caliber. No
aortic dissection. No neck base, axillary, mediastinal or hilar
masses or pathologically enlarged lymph nodes.

There small bilateral effusions, right greater than left. There is
mild patchy hazy opacity mostly in the dependent lower lobes. There
is mild lower lobe interstitial thickening. Limited evaluation of
the upper abdomen is unremarkable.

There are mild degenerative changes of the thoracic spine. No
osteoblastic or osteolytic lesions.

Review of the MIP images confirms the above findings.
IMPRESSION: 1. No evidence of a pulmonary embolus.
2. Combination of mild cardiomegaly, small pleural effusions, mild
lower lobe interstitial thickening and patchy hazy areas of
dependent opacity is consistent with mild congestive heart failure.

## 2015-11-09 IMAGING — CR DG LUMBAR SPINE 2-3V
1 series · 3 of 3 positions shown · non-contrast
Comparison: None.

CLINICAL DATA: Low back pain radiating down right leg.

EXAM:
LUMBAR SPINE - 2-3 VIEW

[Series 5: t lumbar spine ap · 0.14mm/px · 3 of 3 slices shown]
[im 1/3]
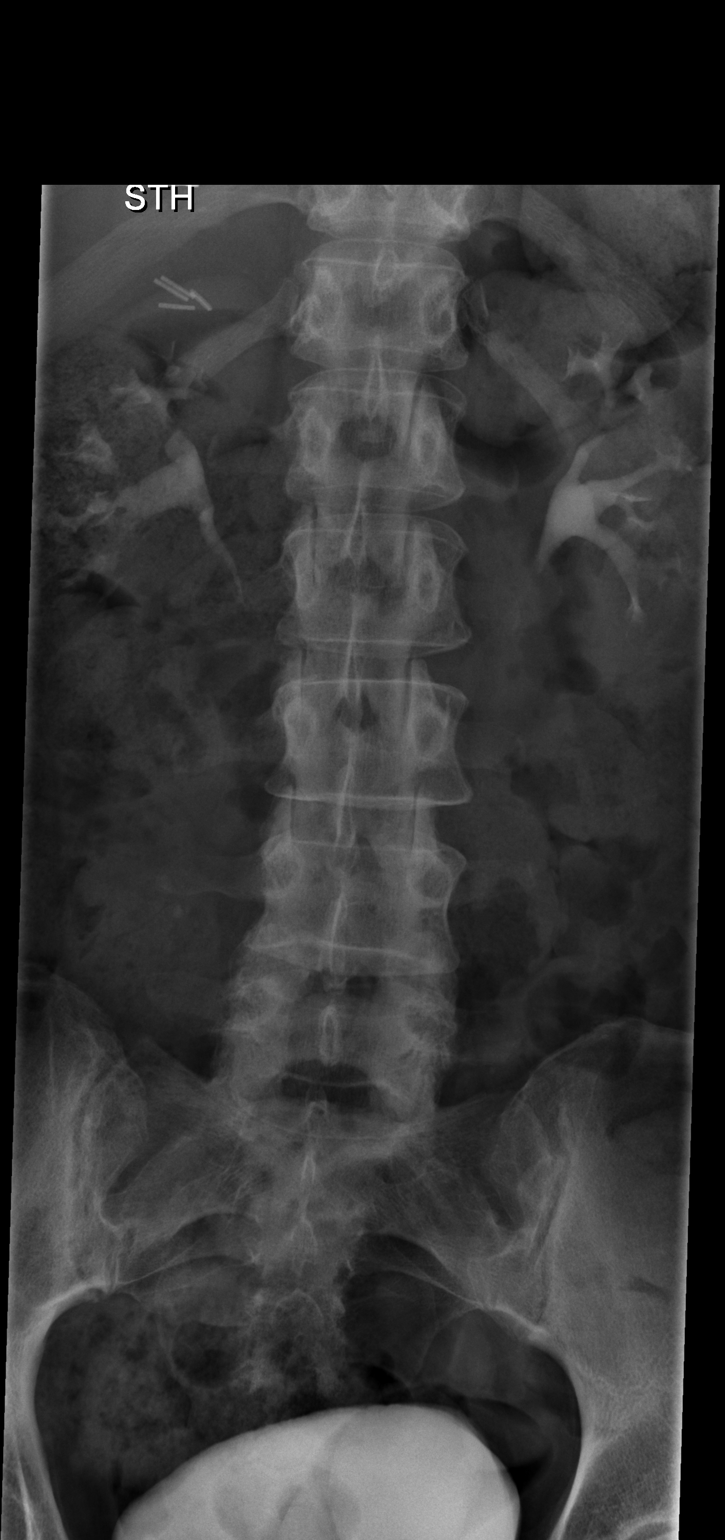
[im 2/3]
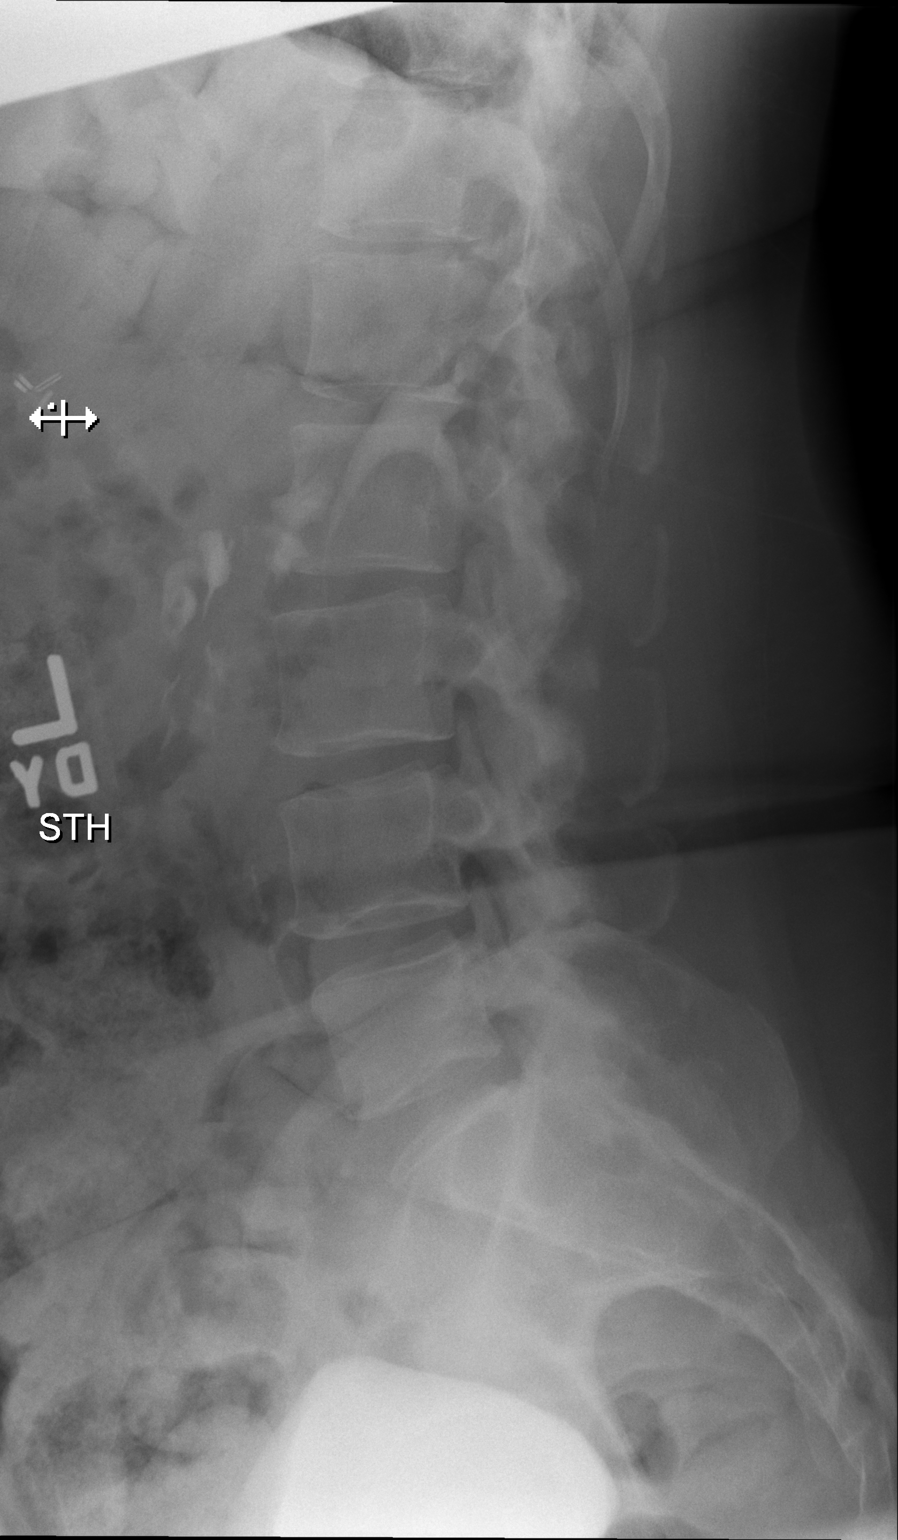
[im 3/3]
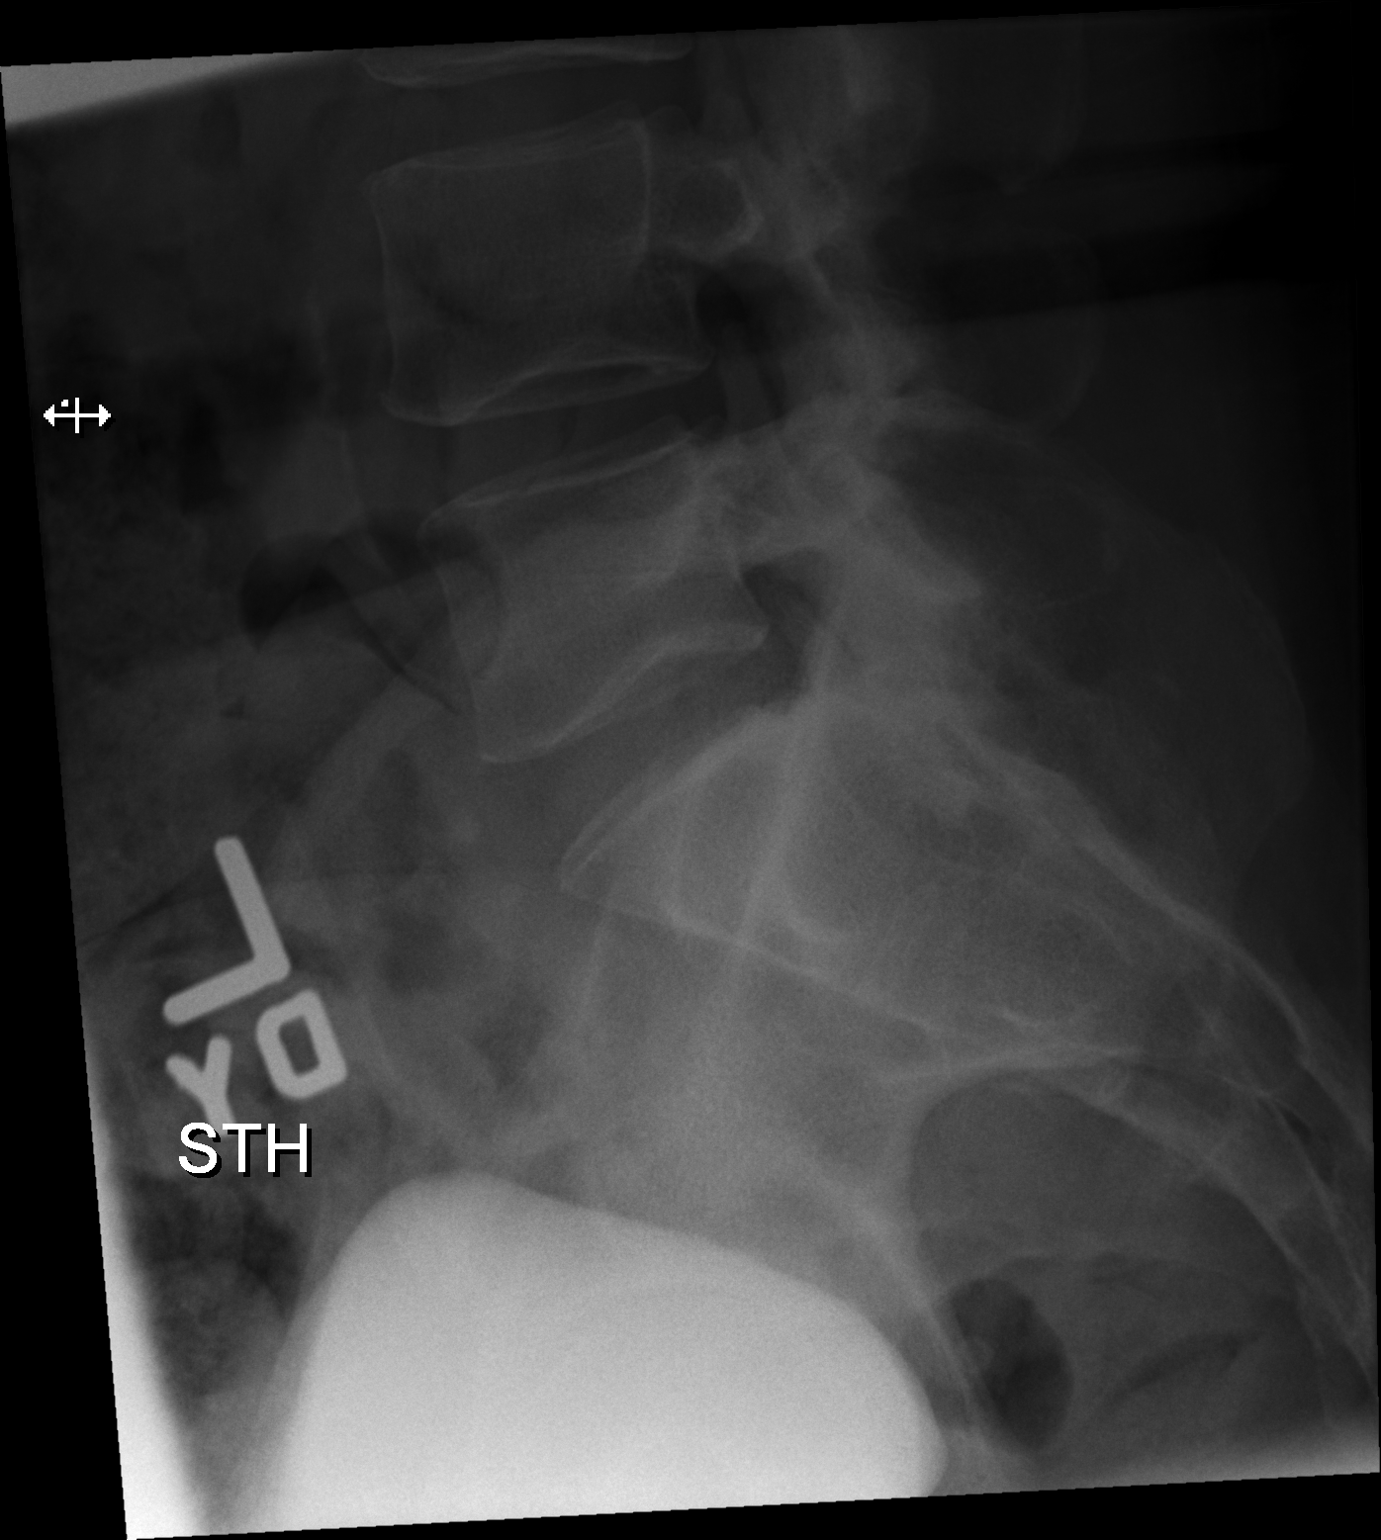

[3 of 3 positions shown; findings below may reference images not displayed]

FINDINGS: Five non rib-bearing lumbar type vertebra are identified in normal
alignment.

There is no evidence of fracture or subluxation.

The disc spaces are maintained.

Mild facet arthropathy in the lumbar spine noted.
IMPRESSION: Mild facet arthropathy without other significant abnormality.

## 2015-11-09 NOTE — Assessment & Plan Note (Signed)
Well-controlled at this time. No recurrent esophageal spasms. She will continue Protonix, dicyclomine, and diltiazem. Given return precautions.

## 2015-11-09 NOTE — Patient Instructions (Signed)
Nice to see you. Please keep the appointment with the neurologist. We'll refer you to see a different neurosurgeon. We will also refer you to a hematologist for evaluation and management of your recurrent PEs and DVTs. If you develop chest pain, shortness of breath, numbness, weakness, loss of bowel or bladder function, increased swelling, or any new or changing symptoms please seek medical attention.

## 2015-11-09 NOTE — Assessment & Plan Note (Signed)
Chronic issue. MRI reviewed. No obvious cause for her saddle numbness and buttocks numbness and stool leakage. Patient will see neurology next week. She would like to see a different neurosurgeon. Referral has been placed for this. She is neurologically intact as outlined in the physical exam today. She is given return precautions.

## 2015-11-09 NOTE — Assessment & Plan Note (Signed)
Followed by orthopedics. We will request records from the orthopedic surgeon what procedure was done and what is planned. She will continue to follow with them. She had a benign exam today. We will also refer to hematology given the patient mentioned that they were planning on doing surgery on her wrist so that we can get their assistance in her anticoagulation.

## 2015-11-09 NOTE — Progress Notes (Signed)
Pre visit review using our clinic review tool, if applicable. No additional management support is needed unless otherwise documented below in the visit note. 

## 2015-11-09 NOTE — Assessment & Plan Note (Signed)
Stable on Eliquis. IVC filter in place. Referral is been placed in hematology for further management and evaluation especially in light of her potentially considering surgery for her wrist. Given return precautions.

## 2015-11-09 NOTE — Progress Notes (Signed)
Patient ID: Valerie Vazquez, female   DOB: 1955-11-10, 60 y.o.   MRN: [id number]  Valerie Rumps, MD Phone: [phone]  Valerie Vazquez is a 60 y.o. female who presents today for follow-up.  Back pain: Patient notes this is unchanged from previously. She notes mild low back discomfort. She notes continued bowel incontinence. She had an MRI in the ED that revealed moderate stenosis in the lumbar region. She notes no new symptoms. Notes she has seen neurosurgery in the past for her neck though she reports they did not do anything for this. She notes occasional pain radiating down her arms. She has an appointment with neurology on Monday. She reports no new symptoms related to this.  She additionally notes she saw orthopedics for her wrist. The way she describes it as pain in the radial aspect and they did an injection of what sounds like the tendon. She notes this did not help. She notes they want to do surgery and advised her to discuss her anticoagulation with me. Of note she is on Eliquis for a history of multiple PEs and DVTs. She has not had any blood clots since starting on Eliquis. No unilateral leg swelling. No chest pain or shortness of breath or palpitations. She has not seen a hematologist since leaving [place].  She additionally notes some mild fluid on her knees. No fluid in her legs. No shortness of breath. She does note her weight is up 10 pounds since her last visit.  GERD: Patient notes her reflux is well controlled. Occasionally gets an esophageal spasm though none recently. Some burning with reflux recently though none recently. No chest pain with this. Notes the Protonix helps. She also takes dicyclomine and diltiazem and these help as well.  PMH: Former smoker.   ROS see history of present illness  Objective  Physical Exam Filed Vitals:   11/09/15 1309  BP: 118/64  Pulse: 89  Temp: 98.3 F (36.8 C)    Physical Exam  Constitutional: She is well-developed,  well-nourished, and in no distress.  HENT:  Head: Normocephalic and atraumatic.  Right Ear: External ear normal.  Left Ear: External ear normal.  Mouth/Throat: Oropharynx is clear and moist.  Cardiovascular: Normal rate, regular rhythm and normal heart sounds.  Exam reveals no gallop and no friction rub.   No murmur heard. Pulmonary/Chest: Effort normal and breath sounds normal. No respiratory distress. She has no wheezes. She has no rales.  Abdominal: Soft. Bowel sounds are normal. She exhibits no distension. There is no tenderness. There is no rebound and no guarding.  Musculoskeletal: She exhibits no edema.  Bilateral knees with no tenderness or effusion, no ligamentous laxity, negative McMurray's No midline spine tenderness, no midline spine step-off, no muscular back tenderness Right wrist nontender, no swelling, right hand warm and well-perfused  Neurological: She is alert.  CN 2-12 intact, 5/5 strength in bilateral biceps, triceps, grip, quads, hamstrings, plantar and dorsiflexion, sensation to light touch intact in bilateral UE and LE, normal gait, 2+ patellar reflexes  Skin: Skin is warm and dry. She is not diaphoretic.     Assessment/Plan: Please see individual problem list.  Chronic lumbosacral pain Chronic issue. MRI reviewed. No obvious cause for her saddle numbness and buttocks numbness and stool leakage. Patient will see neurology next week. She would like to see a different neurosurgeon. Referral has been placed for this. She is neurologically intact as outlined in the physical exam today. She is given return precautions.  History of pulmonary  embolus (PE) Stable on Eliquis. IVC filter in place. Referral is been placed in hematology for further management and evaluation especially in light of her potentially considering surgery for her wrist. Given return precautions.  Right wrist pain Followed by orthopedics. We will request records from the orthopedic surgeon what  procedure was done and what is planned. She will continue to follow with them. She had a benign exam today. We will also refer to hematology given the patient mentioned that they were planning on doing surgery on her wrist so that we can get their assistance in her anticoagulation.  GERD (gastroesophageal reflux disease) Well-controlled at this time. No recurrent esophageal spasms. She will continue Protonix, dicyclomine, and diltiazem. Given return precautions.  Patient additionally notes slight weight gain and may be some bilateral swelling in her knees. She does have a history of grade 1 diastolic dysfunction. She notes when this occurs she typically takes Lasix to help decrease her weight and fluid. She will take 1 dose of Lasix for the next 2 days and see if her weight responds to this. She'll let us know if it does not. Given return precautions.  Orders Placed This Encounter  Procedures  . Ambulatory referral to Neurosurgery    Referral Priority:  Routine    Referral Type:  Surgical    Referral Reason:  Specialty Services Required    Requested Specialty:  Neurosurgery    Number of Visits Requested:  1  . Ambulatory referral to Hematology    Referral Priority:  Routine    Referral Type:  Consultation    Referral Reason:  Specialty Services Required    Requested Specialty:  Oncology    Number of Visits Requested:  [place], MD [place]

## 2015-11-12 ENCOUNTER — Encounter: Payer: Self-pay | Admitting: Neurology

## 2015-11-12 ENCOUNTER — Ambulatory Visit (INDEPENDENT_AMBULATORY_CARE_PROVIDER_SITE_OTHER): Payer: Medicare Other | Admitting: Neurology

## 2015-11-12 ENCOUNTER — Other Ambulatory Visit (INDEPENDENT_AMBULATORY_CARE_PROVIDER_SITE_OTHER): Payer: Medicare Other

## 2015-11-12 VITALS — BP 130/88 | HR 86 | Ht 66.0 in | Wt 211.5 lb

## 2015-11-12 DIAGNOSIS — M545 Low back pain, unspecified: Secondary | ICD-10-CM

## 2015-11-12 DIAGNOSIS — R159 Full incontinence of feces: Secondary | ICD-10-CM | POA: Diagnosis not present

## 2015-11-12 DIAGNOSIS — G8929 Other chronic pain: Secondary | ICD-10-CM

## 2015-11-12 DIAGNOSIS — I201 Angina pectoris with documented spasm: Secondary | ICD-10-CM | POA: Diagnosis not present

## 2015-11-12 DIAGNOSIS — R202 Paresthesia of skin: Secondary | ICD-10-CM | POA: Diagnosis not present

## 2015-11-12 DIAGNOSIS — R292 Abnormal reflex: Secondary | ICD-10-CM | POA: Diagnosis not present

## 2015-11-12 LAB — CREATININE, SERUM: Creatinine, Ser: 0.86 mg/dL (ref 0.40–1.20)

## 2015-11-12 LAB — VITAMIN B12: Vitamin B-12: 617 pg/mL (ref 211–911)

## 2015-11-12 NOTE — Patient Instructions (Addendum)
1. MRI brain wwo contrast 2. Physical therapy for low back pain 3. Check blood work  4. We do not manage chronic pain.  You will need to see pain management  We will call you with the results

## 2015-11-12 NOTE — Progress Notes (Signed)
[facility] Clinic Note - Initial Visit   Date: 11/12/2015  Valerie Vazquez MRN: [id number] DOB: 1956-06-28   Dear Dr. Caryl Bis:  Thank you for your kind referral of Valerie Vazquez [facility] for consultation of chronic low back pain. Although her history is well known to you, please allow [place] to reiterate it for the purpose of our medical record. The patient was accompanied to the clinic by self.    History of Present Illness: Valerie Vazquez is a 60 y.o. left-handed African American female with hypertension, hyperlipidemia, diet-controlled diabetes mellitus, GERD, depression, chronic back pain, cervical spondylosis s/p surgery (2014) and history of PE s/p IVC filter presenting for evaluation of low back pain.    She reports having 3-4 year history of low back pain, described as dull and achy.  She describes deep muscle spasm.  It is worse with activity and bending and improved by rest.  She takes percocet 10/325mg  three times daily recently lowered to percocet 5/325mg  TID.  She also takes flexeril 10mg  three times daily and gabapentin 600mg  three times daily.  She has not tried physical therapy.  She also complains of numbness at the base of her back and stool leakage. MRI lumbar spine did not demonstrate a structural lesion to explain symptoms.  She was also evaluated by Dr. Joya Salm who did not see any surgical indication for symptoms.  She has chronic neck pain and feels as if there is a lump on the left side of neck ever since she had surgery for her cervical spine.    Out-side paper records, electronic medical record, and images have been reviewed where available and summarized as:  MRI lumbar spine wo contrast 10/19/2015: No acute lumbar spine fracture or malalignment. Advanced facet arthropathy. Mild to moderate canal stenosis at L4-5, very mild at L2-3 and L3-4. Moderate RIGHT L4-5 neural foraminal narrowing. Multiple partially imaged sacral  meningeal cyst.  Lab Results  Component Value Date   TSH 1.04 10/11/2015   Lab Results  Component Value Date   HGBA1C 6.5* 09/19/2014    Past Medical History  Diagnosis Date  . Spinal stenosis   . Hypertension   . Pulmonary embolism ([place]) 04/2001; 09/19/2014    "after gallbladder OR; "  . High cholesterol   . Myocardial infarction ([place]) 10/2010 X 3    "while hospitalized"  . Pneumonia 04/2014  . Sleep apnea     "mild" (09/19/2014)  . Type II diabetes mellitus ([place])     "dx'd in 2007; lost weight; no RX for ~ 4 yr now" (09/19/2014)  . Anemia   . History of blood transfusion 1985    related to hysterectomy  . Esophageal spasm   . Gastritis   . Gastroenteritis   . IBS (irritable bowel syndrome)   . GERD (gastroesophageal reflux disease)   . Headache     "couple /month lately" (09/19/2014)  . Migraine     "a few times/yr" (09/19/2014)  . Osteoarthritis     "qwhere; mostly around my joints" (09/19/2014)  . Spondylosis   . Chronic back pain     "upper and lower" (09/19/2014)  . Depression     "major" (09/19/2014)  . PTSD (post-traumatic stress disorder)   . Schizoaffective disorder ([place])   . Snoring     a. sleep study 5/16: No OSA  . High blood pressure   . Bilateral renal cysts   . Gallstones   . DVT (deep venous thrombosis) ([place])  Past Surgical History  Procedure Laterality Date  . Excision/release bursa hip Right 12/1984  . Total hip arthroplasty Left 04-06-2014  . Tonsillectomy and adenoidectomy  ~ 1968  . Appendectomy  1985  . Abdominal hysterectomy  1985  . Cholecystectomy  2002  . Hernia repair  1985  . Umbilical hernia repair  1985  . Vena cava filter placement  03/2014  . Back surgery    . Anterior cervical decomp/discectomy fusion  10/2012  . Breast cyst excision Right   . Cardiac catheterization   2000's; 2009  . Left heart catheterization with coronary angiogram N/A 12/09/2014    Procedure: LEFT HEART CATHETERIZATION WITH CORONARY ANGIOGRAM;  Surgeon:  Burnell Blanks, MD;  Location: [facility] CATH LAB;  Service: Cardiovascular;  Laterality: N/A;     Medications:  Outpatient Encounter Prescriptions as of 11/12/2015  Medication Sig Note  . apixaban (ELIQUIS) 5 MG TABS tablet Take 1 tablet (5 mg total) by mouth 2 (two) times daily.   Marland Kitchen BIOTIN PO Take 1 tablet by mouth daily.   . calcium carbonate (TUMS - DOSED IN MG ELEMENTAL CALCIUM) 500 MG chewable tablet Chew 1 tablet by mouth daily as needed for indigestion or heartburn.   . carvedilol (COREG) 3.125 MG tablet Take 1 tablet (3.125 mg total) by mouth 2 (two) times daily with a meal.   . cyclobenzaprine (FLEXERIL) 10 MG tablet Take 1 tablet (10 mg total) by mouth 3 (three) times daily as needed for muscle spasms.   Marland Kitchen dicyclomine (BENTYL) 10 MG capsule TAKE 2 CAPSULES BY MOUTH EVERY SIX HOURS 07/07/2015: scheduled  . diltiazem (CARDIZEM CD) 120 MG 24 hr capsule Take 1 capsule (120 mg total) by mouth daily.   . diphenhydrAMINE (BENADRYL) 25 MG tablet Take 1-2 tablets (25-50 mg total) by mouth every 6 (six) hours as needed for itching (Rash).   . EPINEPHrine 0.3 mg/0.3 mL IJ SOAJ injection Inject 0.3 mLs (0.3 mg total) into the muscle once.   . escitalopram (LEXAPRO) 10 MG tablet TAKE 1 TABLET BY MOUTH EVERY DAY   . famotidine (PEPCID) 20 MG tablet Take 1 tablet (20 mg total) by mouth 2 (two) times daily. Take for itching   . fluconazole (DIFLUCAN) 150 MG tablet Take 1 tablet (150 mg total) by mouth every 3 (three) days.   . furosemide (LASIX) 20 MG tablet TAKE 1 TABLET BY MOUTH EVERY DAY   . gabapentin (NEURONTIN) 600 MG tablet Take 1 tablet (600 mg total) by mouth every 8 (eight) hours.   . hydrOXYzine (ATARAX/VISTARIL) 25 MG tablet Take 1 tablet (25 mg total) by mouth every 6 (six) hours as needed for itching.   . isosorbide mononitrate (IMDUR) 30 MG 24 hr tablet Take 1 tablet (30 mg total) by mouth daily.   . Multiple Vitamin (MULTIVITAMIN WITH MINERALS) TABS tablet Take 2 tablets by mouth  daily.    . ondansetron (ZOFRAN) 4 MG tablet Take 1 tablet (4 mg total) by mouth every 8 (eight) hours as needed for nausea or vomiting.   Marland Kitchen oxyCODONE-acetaminophen (PERCOCET/ROXICET) 5-325 MG tablet Take 1 tablet by mouth 2 (two) times daily as needed for severe pain.   . pantoprazole (PROTONIX) 40 MG tablet TAKE ONE TABLET BY MOUTH EVERY DAY   . polyethylene glycol powder (GLYCOLAX/MIRALAX) powder Take 17 g by mouth 2 (two) times daily as needed.   . potassium chloride (K-DUR) 10 MEQ tablet Take 1 tablet (10 mEq total) by mouth 2 (two) times daily.   . traZODone (  DESYREL) 50 MG tablet Take 1 tablet (50 mg total) by mouth at bedtime. (Patient taking differently: Take 50 mg by mouth at bedtime as needed for sleep. )   . triamcinolone (NASACORT AQ) 55 MCG/ACT AERO nasal inhaler Place 2 sprays into the nose daily.    No facility-administered encounter medications on file as of 11/12/2015.     Allergies:  Allergies  Allergen Reactions  . Abilify [Aripiprazole] Other (See Comments)    Jerking movement, slow motor skills  . Augmentin [Amoxicillin-Pot Clavulanate] Diarrhea and Nausea And Vomiting  . Bactrim [Sulfamethoxazole-Trimethoprim] Diarrhea  . Ceftin [Cefuroxime Axetil] Diarrhea  . Coconut Oil     Rash   . Diclofenac Other (See Comments)     Muscle Pain  . Indocin [Indomethacin] Other (See Comments)    Migraines   . Linaclotide Diarrhea  . Lisinopril Diarrhea and Other (See Comments)    Other reaction(s): Dizziness, Vomiting  . Morphine Other (See Comments)    Chest Tightness  . Morphine And Related Other (See Comments)    Chest tightness   . Simvastatin Other (See Comments)    Muscle Pain  . Sulfa Antibiotics Diarrhea  . Sulfamethoxazole-Trimethoprim Diarrhea  . Wellbutrin [Bupropion] Other (See Comments)    Jerking movement Slurred speech  . Quetiapine Fumarate Palpitations    Family History: Family History  Problem Relation Age of Onset  . Heart attack Neg Hx   .  Stroke Mother     Social History: Social History  Substance Use Topics  . Smoking status: Former Smoker -- 1.50 packs/day for 10 years    Types: Cigarettes    Quit date: 04/12/1979  . Smokeless tobacco: Never Used  . Alcohol Use: No   Social History   Social History Narrative    Review of Systems:  CONSTITUTIONAL: No fevers, chills, night sweats, or weight loss.   EYES: No visual changes or eye pain ENT: No hearing changes.  No history of nose bleeds.   RESPIRATORY: No cough, wheezing and shortness of breath.   CARDIOVASCULAR: Negative for chest pain, and palpitations.   GI: Negative for abdominal discomfort, blood in stools or black stools.  No recent change in bowel habits.   GU:  No history of incontinence.   MUSCLOSKELETAL: +history of joint pain or swelling.  No myalgias.   SKIN: Negative for lesions, rash, and itching.   HEMATOLOGY/ONCOLOGY: Negative for prolonged bleeding, bruising easily, and swollen nodes.  No history of cancer.   ENDOCRINE: Negative for cold or heat intolerance, polydipsia or goiter.   PSYCH:  +depression or anxiety symptoms.   NEURO: As Above.   Vital Signs:  BP 130/88 mmHg  Pulse 86  Ht 5\' 6"  (1.676 m)  Wt 211 lb 8 oz (95.936 kg)  BMI 34.15 kg/m2  SpO2 96%   General Medical Exam:   General:  Well appearing, comfortable.   Eyes/ENT: see cranial nerve examination.   Neck: No masses appreciated.  Full range of motion without tenderness.  No carotid bruits. Respiratory:  Clear to auscultation, good air entry bilaterally.   Cardiac:  Regular rate and rhythm, no murmur.   Extremities:  No deformities, edema, or skin discoloration.  Skin:  No rashes or lesions.  Neurological Exam: MENTAL STATUS including orientation to time, place, person, recent and remote memory, attention span and concentration, language, and fund of knowledge is normal.  Speech is not dysarthric.  CRANIAL NERVES: II:  No visual field defects.  Unremarkable fundi.     III-IV-VI: Pupils  equal round and reactive to light.  Normal conjugate, extra-ocular eye movements in all directions of gaze.  No nystagmus.  No ptosis.   V:  Normal facial sensation.     VII:  Normal facial symmetry and movements.  No pathologic facial reflexes.  VIII:  Normal hearing and vestibular function.   IX-X:  Normal palatal movement.   XI:  Normal shoulder shrug and head rotation.   XII:  Normal tongue strength and range of motion, no deviation or fasciculation.  MOTOR:  No atrophy, fasciculations or abnormal movements.  No pronator drift.  Tone is normal.    Right Upper Extremity:    Left Upper Extremity:    Deltoid  5/5   Deltoid  5/5   Biceps  5/5   Biceps  5/5   Triceps  5/5   Triceps  5/5   Wrist extensors  5/5   Wrist extensors  5/5   Wrist flexors  5/5   Wrist flexors  5/5   Finger extensors  5/5   Finger extensors  5/5   Finger flexors  5/5   Finger flexors  5/5   Dorsal interossei  5/5   Dorsal interossei  5/5   Abductor pollicis  5/5   Abductor pollicis  5/5   Tone (Ashworth scale)  0  Tone (Ashworth scale)  0   Right Lower Extremity:    Left Lower Extremity:    Hip flexors  5/5   Hip flexors  5/5   Hip extensors  5/5   Hip extensors  5/5   Knee flexors  5/5   Knee flexors  5/5   Knee extensors  5/5   Knee extensors  5/5   Dorsiflexors  5/5   Dorsiflexors  5/5   Plantarflexors  5/5   Plantarflexors  5/5   Toe extensors  5/5   Toe extensors  5/5   Toe flexors  5/5   Toe flexors  5/5   Tone (Ashworth scale)  0  Tone (Ashworth scale)  0   MSRs:  Right                                                                 Left brachioradialis 3+  brachioradialis 3+  biceps 3+  biceps 3+  triceps 3+  triceps 3+  patellar 3+  patellar 3+  ankle jerk 2+  ankle jerk 2+  Hoffman no  Hoffman no  plantar response down  plantar response down   SENSORY:  Pin prick reduced over the right medial elbow.  There is no sensory level.  Sensation to all modalities is normal in  the upper and lower extremities.  Romberg's sign absent.   COORDINATION/GAIT: Normal finger-to- nose-finger and heel-to-shin.  Intact rapid alternating movements bilaterally.  Able to rise from a chair without using arms.  Gait narrow based and stable. Tandem and stressed gait intact.    IMPRESSION: Ms. Valerie Vazquez is a 60 year-old female referred for evaluation of low back pain and generalized patchy paresthesias.  MRI lumbar spine personally reviewed and does not provide explanation for her symptoms.  MRI brain wwo contrast will be ordered to exclude demyelinating disease, but my overall clinical suspicion is low.  She is on a number of medications for low back  pain including chronic opioid therapy, muscle relaxants, and gabapentin.  She was made aware that my office his non-opioid prescribing and we do not manage chronic pain and she would need to see Pain Management.  I explained that based on her imaging, she does not have severe nerve impingement and it is possible her pain is more musculoskeletal in nature. I will refer her to physical therapy for low back pain and stretching.  With her patch sensory changes, vitamin B12 level will be checked.  There is no clinical findings to suggest she has diabetic neuropathy.    Further recommendations based on the results of the MRI brain.  The duration of this appointment visit was 45 minutes of face-to-face time with the patient.  Greater than 50% of this time was spent in counseling, explanation of diagnosis, planning of further management, and coordination of care.   Thank you for allowing me to participate in patient's care.  If I can answer any additional questions, I would be pleased to do so.    Sincerely,    Donika K. Posey Pronto, DO

## 2015-11-13 IMAGING — MG MM DIGITAL SCREENING BILAT W/ CAD
1 series · 5 of 5 positions shown · non-contrast
Comparison: None.

CLINICAL DATA: Screening.

EXAM:
DIGITAL SCREENING BILATERAL MAMMOGRAM WITH CAD

[R CC · right · 5 of 5 slices shown]
[im 1/5]
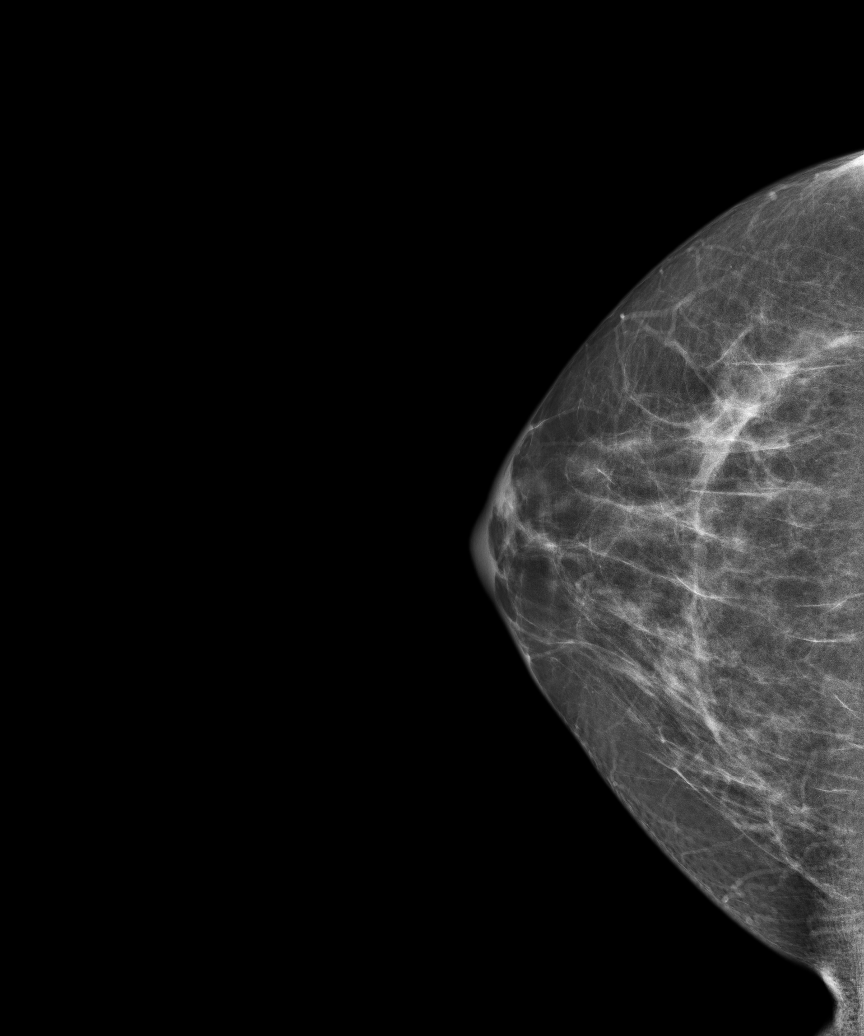
[im 2/5]
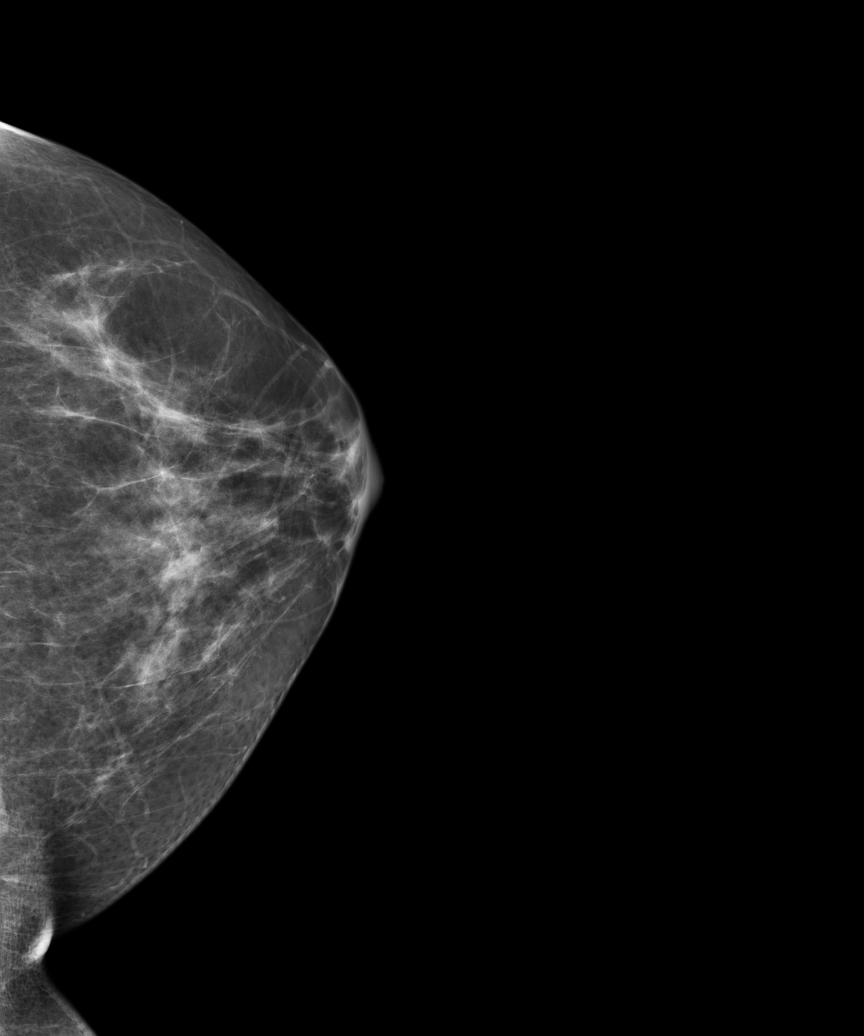
[im 3/5]
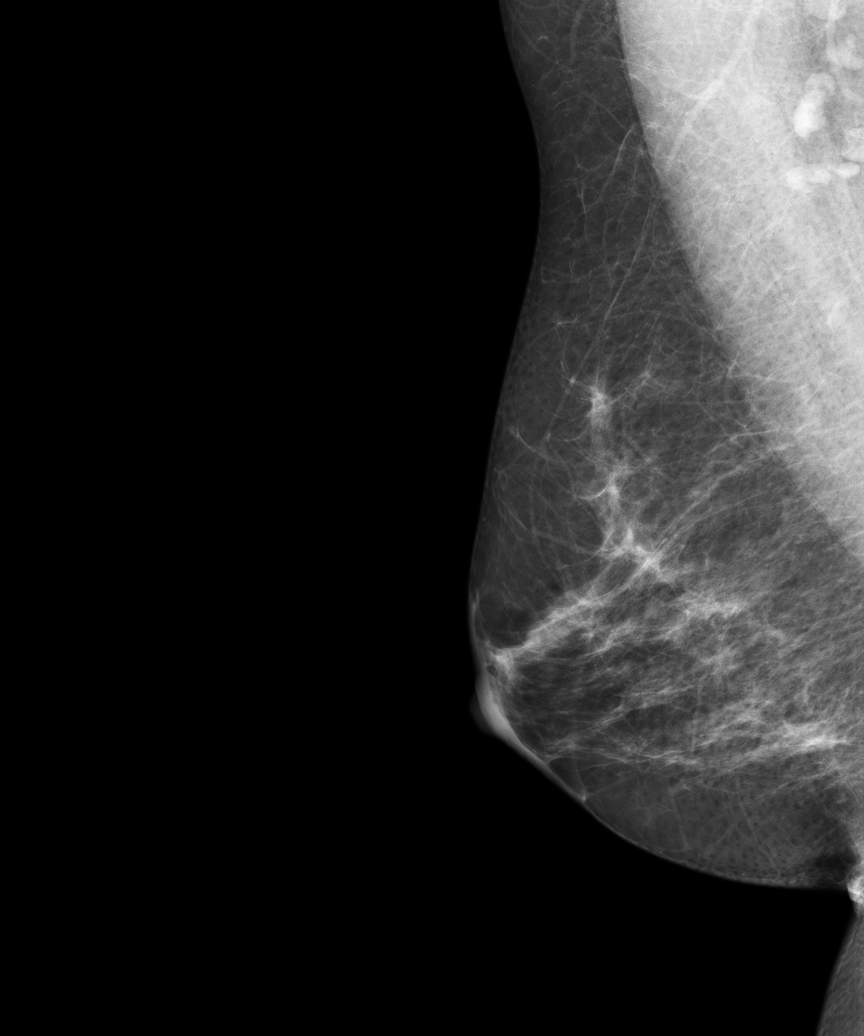
[im 4/5]
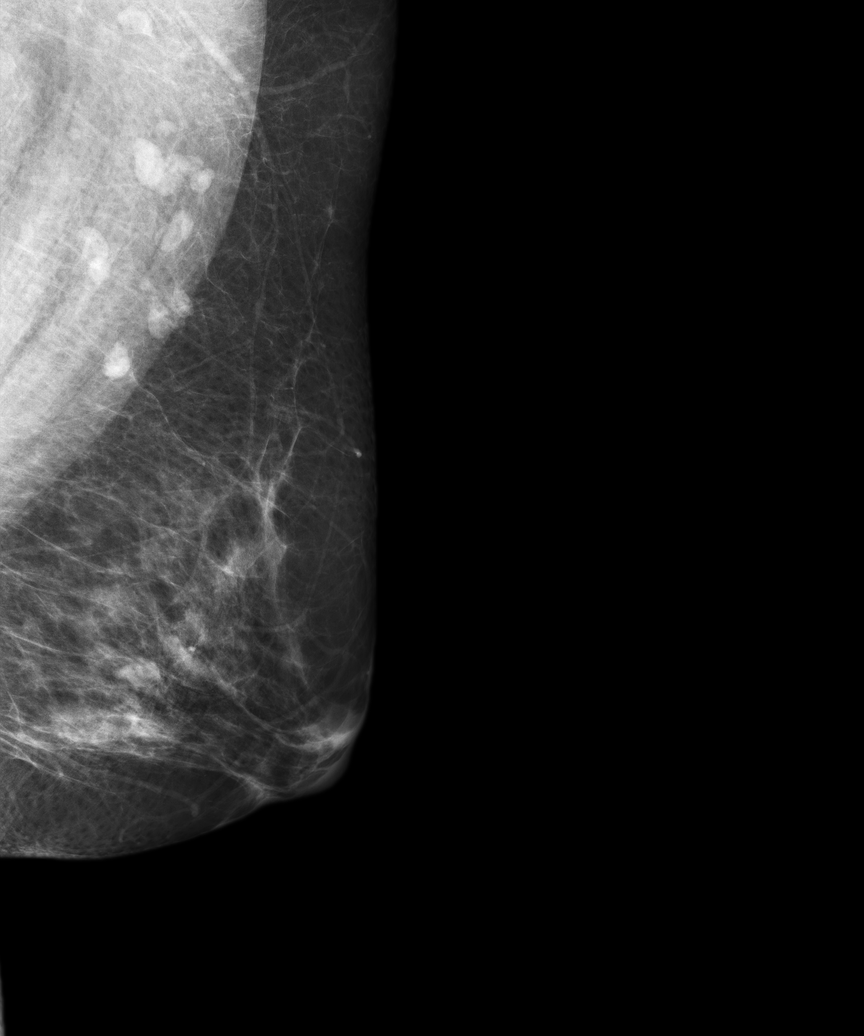
[im 5/5]
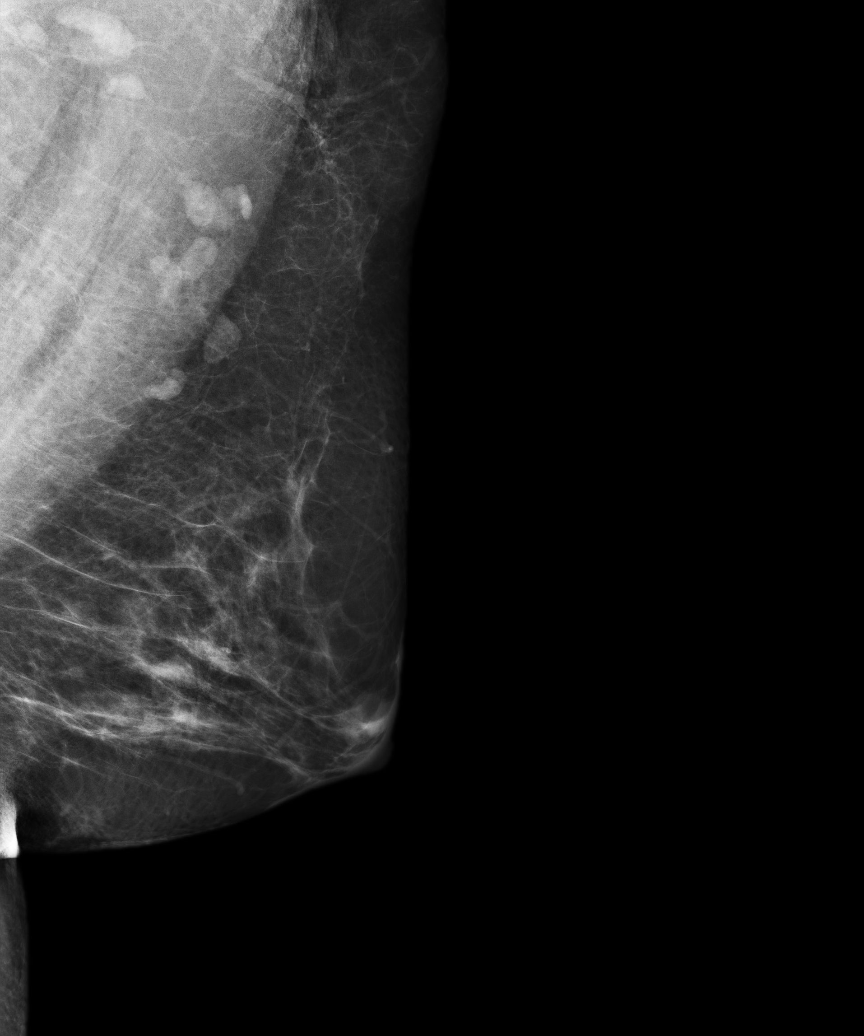

[5 of 5 positions shown; findings below may reference images not displayed]

ACR Breast Density Category b: There are scattered areas of
fibroglandular density.
FINDINGS: There are no findings suspicious for malignancy. Images were
processed with CAD.
IMPRESSION: No mammographic evidence of malignancy. A result letter of this
screening mammogram will be mailed directly to the patient.

RECOMMENDATION:
Screening mammogram in one year. (Code:[id number])

BI-RADS CATEGORY  1: Negative.

## 2015-11-16 ENCOUNTER — Other Ambulatory Visit: Payer: Self-pay

## 2015-11-16 ENCOUNTER — Other Ambulatory Visit: Payer: Self-pay | Admitting: Family Medicine

## 2015-11-16 MED ORDER — APIXABAN 5 MG PO TABS: 5.0000 mg | ORAL_TABLET | Freq: Two times a day (BID) | ORAL | Status: DC

## 2015-11-16 NOTE — Telephone Encounter (Signed)
Pt is requesting a refill on Eliquis 5mg  tab. Last filled 12/12/14 by Rosaria Ferries PA-C. Please advise, thanks

## 2015-11-16 NOTE — Telephone Encounter (Signed)
Refill sent to pharmacy.   

## 2015-11-16 NOTE — Telephone Encounter (Signed)
Pt called about needing a Rx refill for apixaban (ELIQUIS) 5 MG TABS tablet. Pharmacy is [id number] - [name], [place]. Call pt @ [phone]. Thank you!

## 2015-11-19 ENCOUNTER — Encounter: Payer: Self-pay | Admitting: Family Medicine

## 2015-11-19 ENCOUNTER — Ambulatory Visit (INDEPENDENT_AMBULATORY_CARE_PROVIDER_SITE_OTHER): Payer: Medicare Other | Admitting: Family Medicine

## 2015-11-19 VITALS — BP 138/86 | HR 87 | Temp 98.3°F | Ht 66.0 in | Wt 209.4 lb

## 2015-11-19 DIAGNOSIS — K224 Dyskinesia of esophagus: Secondary | ICD-10-CM | POA: Diagnosis not present

## 2015-11-19 DIAGNOSIS — R52 Pain, unspecified: Secondary | ICD-10-CM

## 2015-11-19 DIAGNOSIS — I201 Angina pectoris with documented spasm: Secondary | ICD-10-CM

## 2015-11-19 DIAGNOSIS — R200 Anesthesia of skin: Secondary | ICD-10-CM | POA: Insufficient documentation

## 2015-11-19 DIAGNOSIS — G8929 Other chronic pain: Secondary | ICD-10-CM

## 2015-11-19 DIAGNOSIS — K5901 Slow transit constipation: Secondary | ICD-10-CM

## 2015-11-19 DIAGNOSIS — Z86711 Personal history of pulmonary embolism: Secondary | ICD-10-CM

## 2015-11-19 DIAGNOSIS — M25531 Pain in right wrist: Secondary | ICD-10-CM

## 2015-11-19 MED ORDER — OXYCODONE-ACETAMINOPHEN 5-325 MG PO TABS: 1.0000 | ORAL_TABLET | Freq: Two times a day (BID) | ORAL | Status: DC | PRN

## 2015-11-19 NOTE — Assessment & Plan Note (Signed)
Stable on Eliquis. IVC filter is in place. Awaiting hematology evaluation and recommendations in light of possible surgery on right wrist. Given return precautions.

## 2015-11-19 NOTE — Assessment & Plan Note (Signed)
Chronic recurrent issue. Benign abdominal exam. Likely related to narcotics intake. Discussed possible methods of dealing with this. Offered MiraLAX though she declined opting for fruit smoothies and increase fiber intake as these have been beneficial in the past. She also wants to hold her dicyclomine. Given return precautions.

## 2015-11-19 NOTE — Patient Instructions (Signed)
Nice to see you. Please keep your follow-up with the hematologist. Please continue your Eliquis. We will refer you to GI for evaluation of you likely esophageal spasms. Please complete the MRI as directed by the neurologist. If you develop numbness, weakness, chest pain, shortness of breath, sweating, radiation of chest pain, abdominal pain, blood in her stool, or any new or changing symptoms please seek medical attention.

## 2015-11-19 NOTE — Progress Notes (Signed)
Pre visit review using our clinic review tool, if applicable. No additional management support is needed unless otherwise documented below in the visit note. 

## 2015-11-19 NOTE — Assessment & Plan Note (Signed)
Refill on Percocet given for chronic pain. Patient apparently has a pain management referral in place. Advised the patient that I do not fill this medicine chronically and that once she is seen by pain management she will get all her refills from them.

## 2015-11-19 NOTE — Progress Notes (Signed)
Patient ID: Valerie Vazquez, female   DOB: May 27, 1956, 60 y.o.   MRN: [id number]  [name], MD Phone: [phone]  Valerie Vazquez is a 60 y.o. female who presents today for follow-up.  Right wrist pain: Patient notes this is in the radial aspect of her wrist. Saw orthopedics prior to our last visit and they did an injection. They advised her that she definitely needs surgery as the injection did not help. She notes wearing a brace does help. She is awaiting an appointment with the hematologist to determine her anticoagulation regimen prior to scheduling surgery.  Recurrent DVTs: Patient notes she is waiting hematology consult and this is scheduled for later this week. She is taking Eliquis. She notes no chest pain or shortness of breath. No lower extremity swelling. No recurrence of blood clots since she started on Eliquis.  GERD: Patient notes reflux most days of the week. She does note some esophageal spasms that are sharp discomfort centrally that last for about a minute after eating or drinking something hot or cold. These sensations do not occur at any other time. There is no exertional component. There is no diaphoresis or shortness of breath. There is no radiation. These resolve on their own without any intervention. No spasm at this time.  Constipation: Patient notes she is intermittently quite constipated. She stopped taking her dicyclomine for her esophageal spasms and abdominal cramping and notes that she has started to have bowel movements since discontinuing this. Last bowel movement was this morning. Some nausea when she gets the abdominal cramping. No vomiting. No abdominal pain. She's not taking any medications for constipation.  Patient also notes she saw neurology. States they told her that she needs to be off pain medicines. States that they would like her to enter a pain management program and do physical therapy. They've ordered an MRI of her brain. Patient  notes no change in her intermittent scattered numbness. No weakness.  PMH: Former smoker   ROS see history of present illness  Objective  Physical Exam Filed Vitals:   11/19/15 1406  BP: 138/86  Pulse: 87  Temp: 98.3 F (36.8 C)    BP Readings from Last 3 Encounters:  11/19/15 138/86  11/12/15 130/88  11/09/15 118/64   Wt Readings from Last 3 Encounters:  11/19/15 209 lb 6.4 oz (94.983 kg)  11/12/15 211 lb 8 oz (95.936 kg)  11/09/15 215 lb 9.6 oz (97.796 kg)    Physical Exam  Constitutional: She is well-developed, well-nourished, and in no distress.  HENT:  Head: Normocephalic and atraumatic.  Right Ear: External ear normal.  Left Ear: External ear normal.  Mouth/Throat: Oropharynx is clear and moist. No oropharyngeal exudate.  Eyes: Conjunctivae are normal. Pupils are equal, round, and reactive to light.  Neck: Neck supple.  Cardiovascular: Normal rate, regular rhythm and normal heart sounds.  Exam reveals no gallop and no friction rub.   No murmur heard. Pulmonary/Chest: Effort normal and breath sounds normal. No respiratory distress. She has no wheezes. She has no rales.  Abdominal: Soft. Bowel sounds are normal. She exhibits no distension. There is no tenderness. There is no rebound and no guarding.  Musculoskeletal:  Right wrist with minimal dorsal swelling, mild tenderness over the radial aspect of the wrist, no snuffbox tenderness, no joint tenderness in her hands, left wrist with no swelling, wrist tenderness, or joint tenderness  Lymphadenopathy:    She has no cervical adenopathy.  Neurological: She is alert.  CN 2-12 intact,  5/5 strength in bilateral biceps, triceps, grip, quads, hamstrings, plantar and dorsiflexion, sensation to light touch intact in bilateral UE and LE, normal gait, 2+ patellar reflexes  Skin: Skin is warm. She is not diaphoretic.     Assessment/Plan: Please see individual problem list.  Esophageal spasm Continues to be an  intermittent issue with intake of hot or cold foods or beverages. Patient previously reported that this was infrequent though today reports that it was never infrequent. She also reports that her reflux might be uncontrolled as well. We will refer her to GI for further evaluation of this issue. Symptoms are very atypical and unlikely cardiac. Unlikely VTE related as well given brief nature and the fact that the patient is anticoagulated. Patient has benign lung exam as well making pulmonary cause unlikely. She will continue to monitor. She'll continue proton next. She is given return precautions.  History of pulmonary embolus (PE) Stable on Eliquis. IVC filter is in place. Awaiting hematology evaluation and recommendations in light of possible surgery on right wrist. Given return precautions.  Right wrist pain Continues to be an issue. Followed by orthopedics. Evidently surgery is the next option. Needs evaluation by hematology for plan for anticoagulation prior to surgery.  Chronic pain of multiple sites Refill on Percocet given for chronic pain. Patient apparently has a pain management referral in place. Advised the patient that I do not fill this medicine chronically and that once she is seen by pain management she will get all her refills from them.  Constipation Chronic recurrent issue. Benign abdominal exam. Likely related to narcotics intake. Discussed possible methods of dealing with this. Offered MiraLAX though she declined opting for fruit smoothies and increase fiber intake as these have been beneficial in the past. She also wants to hold her dicyclomine. Given return precautions.  Numbness Chronic recurrent scattered numbness for a long time. Has been evaluated by neurology and plan is for MRI of brain. She'll continue to monitor and proceed with MRI. She's given return precautions.    Orders Placed This Encounter  Procedures  . Ambulatory referral to Gastroenterology    Referral  Priority:  Routine    Referral Type:  Consultation    Referral Reason:  Specialty Services Required    Number of Visits Requested:  1    Meds ordered this encounter  Medications  . oxyCODONE-acetaminophen (PERCOCET/ROXICET) 5-325 MG tablet    Sig: Take 1 tablet by mouth 2 (two) times daily as needed for severe pain.    Dispense:  60 tablet    Refill:  0     [name], MD [place]

## 2015-11-19 NOTE — Assessment & Plan Note (Signed)
Continues to be an issue. Followed by orthopedics. Evidently surgery is the next option. Needs evaluation by hematology for plan for anticoagulation prior to surgery.

## 2015-11-19 NOTE — Assessment & Plan Note (Signed)
Chronic recurrent scattered numbness for a long time. Has been evaluated by neurology and plan is for MRI of brain. She'll continue to monitor and proceed with MRI. She's given return precautions.

## 2015-11-19 NOTE — Assessment & Plan Note (Signed)
Continues to be an intermittent issue with intake of hot or cold foods or beverages. Patient previously reported that this was infrequent though today reports that it was never infrequent. She also reports that her reflux might be uncontrolled as well. We will refer her to GI for further evaluation of this issue. Symptoms are very atypical and unlikely cardiac. Unlikely VTE related as well given brief nature and the fact that the patient is anticoagulated. Patient has benign lung exam as well making pulmonary cause unlikely. She will continue to monitor. She'll continue proton next. She is given return precautions.

## 2015-11-21 ENCOUNTER — Inpatient Hospital Stay: Admission: RE | Admit: 2015-11-21 | Payer: Self-pay | Source: Ambulatory Visit

## 2015-11-22 ENCOUNTER — Inpatient Hospital Stay: Payer: Medicare Other | Admitting: Oncology

## 2015-11-23 ENCOUNTER — Ambulatory Visit: Payer: No Typology Code available for payment source

## 2015-11-26 ENCOUNTER — Ambulatory Visit: Payer: Self-pay | Admitting: Pulmonary Disease

## 2015-12-10 IMAGING — US US EXTREM LOW VENOUS*L*
1 series · 13 of 24 positions shown · non-contrast
Comparison: 10/07/2013

CLINICAL DATA: Left lower extremity edema and history of prior DVT
and pulmonary embolism.



[Series 1: us extrem low venous*left* · 0.08mm/px · 13 of 31 slices shown]
[im 1/31]
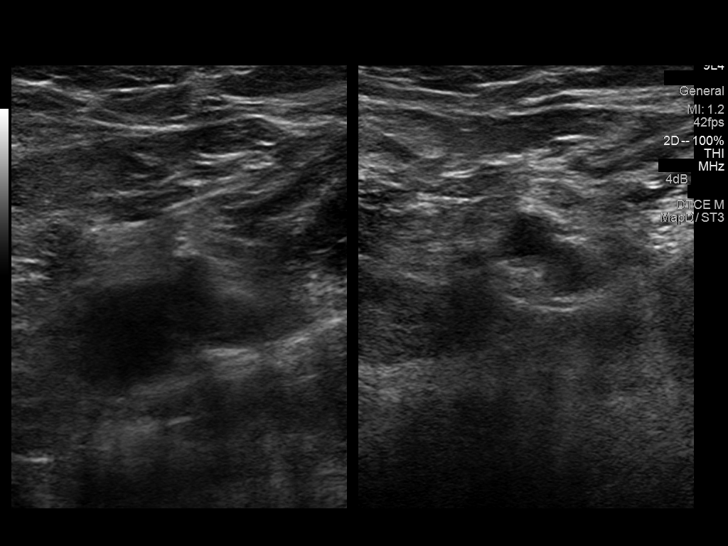
[im 3/31]
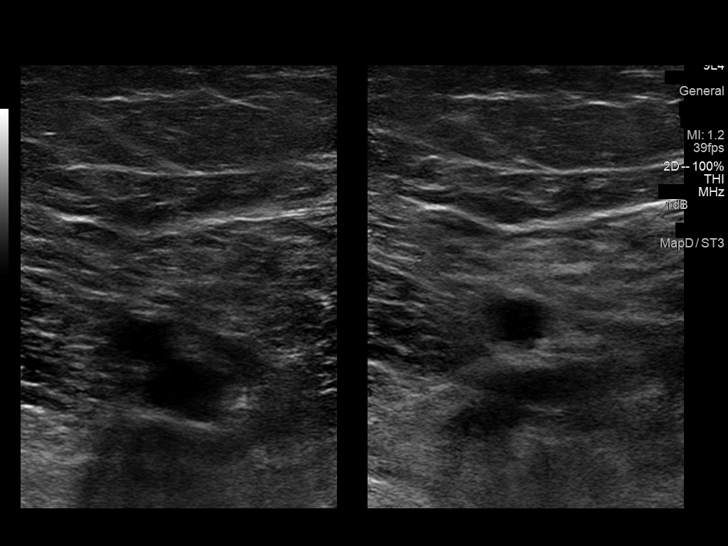
[im 6/31]
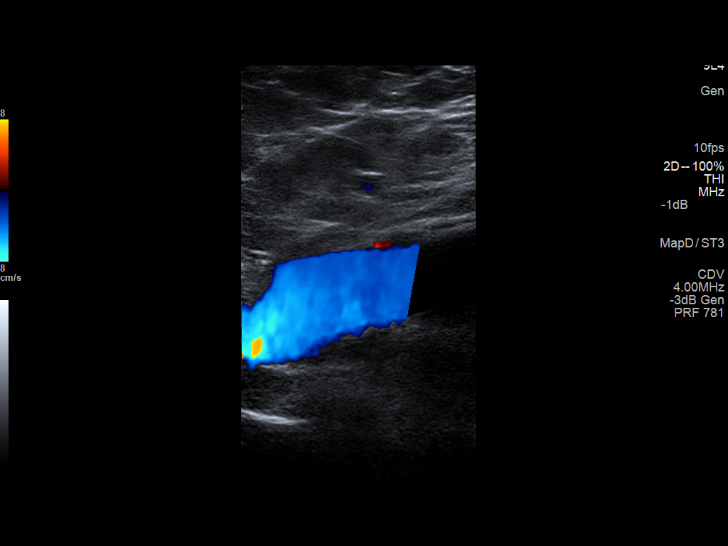
[im 8/31]
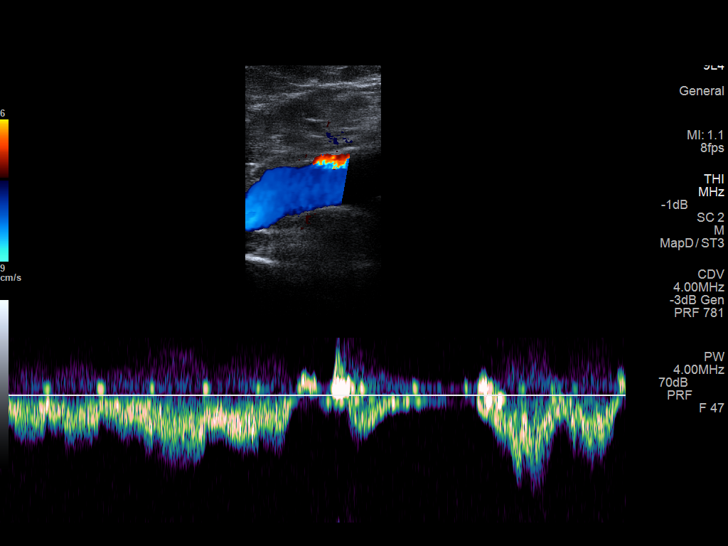
[im 11/31]
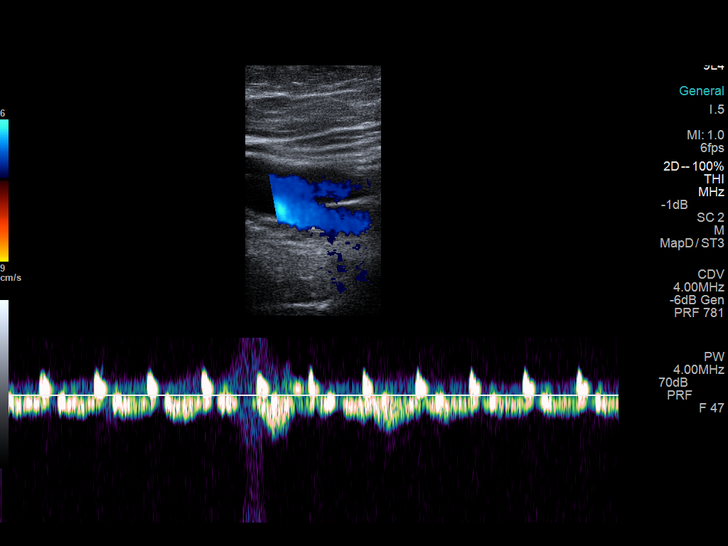
[im 14/31]
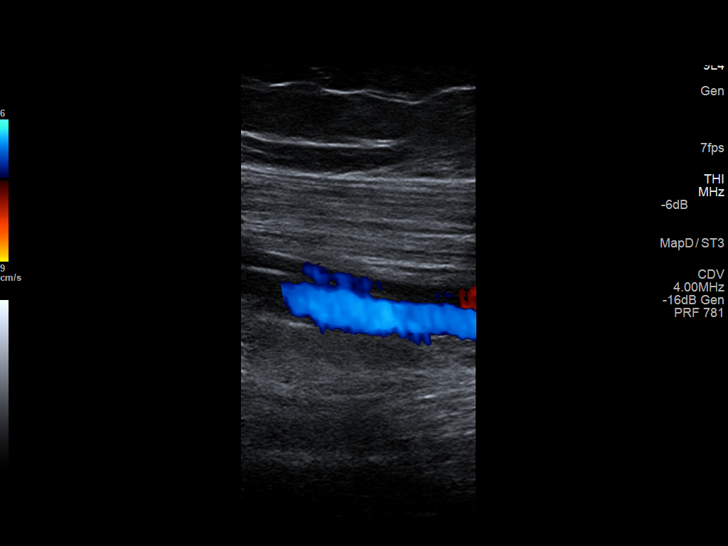
[im 16/31]
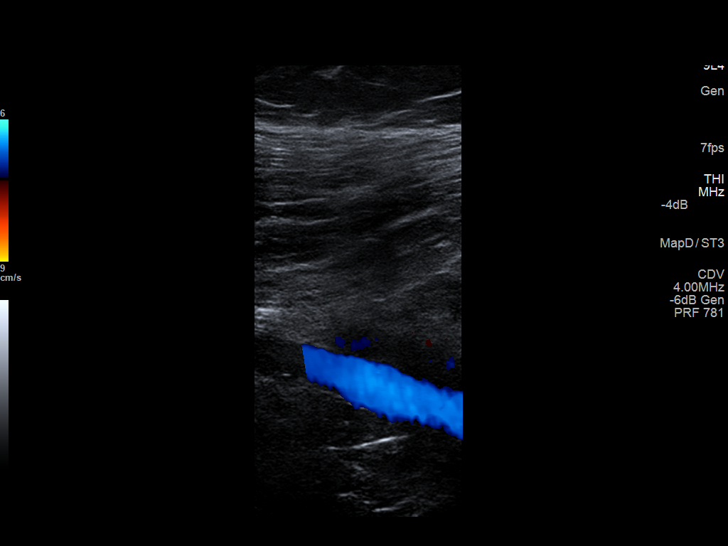
[im 17/31]
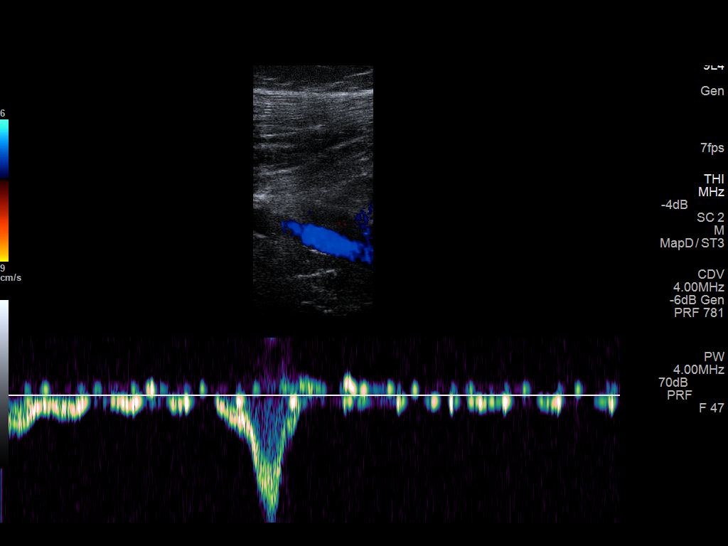
[im 20/31]
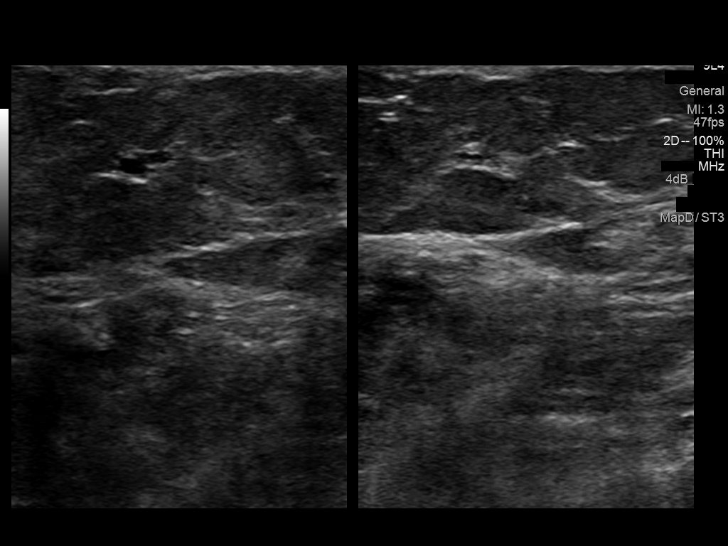
[im 23/31]
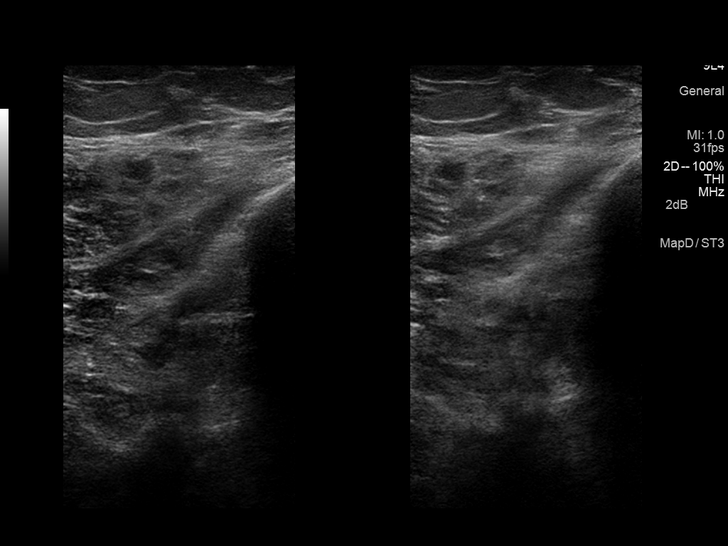
[im 25/31]
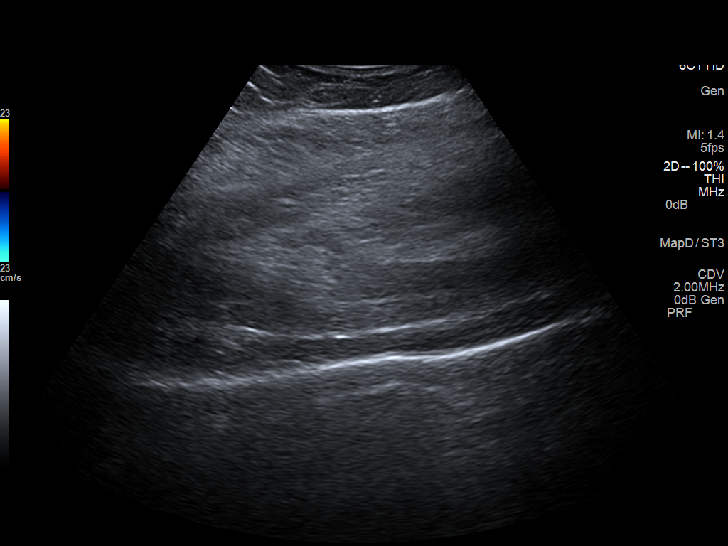
[im 28/31]
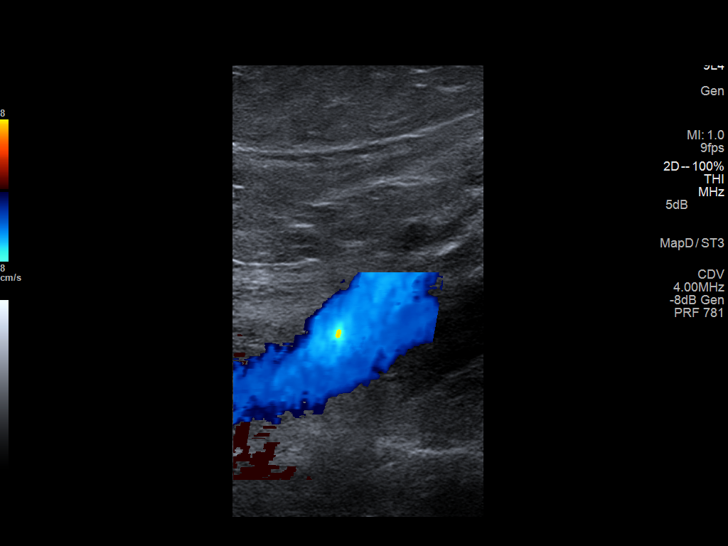
[im 31/31]
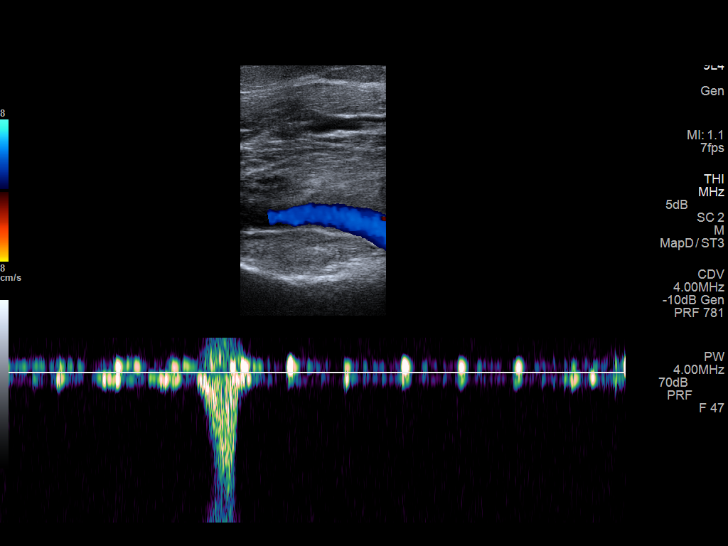

[13 of 24 positions shown; findings below may reference images not displayed]

FINDINGS: Common Femoral Vein: No evidence of thrombus. Normal
compressibility, respiratory phasicity and response to augmentation.

Saphenofemoral Junction: No evidence of thrombus. Normal
compressibility and flow on color Doppler imaging.

Profunda Femoral Vein: No evidence of thrombus. Normal
compressibility and flow on color Doppler imaging.

Femoral Vein: No evidence of thrombus. Normal compressibility,
respiratory phasicity and response to augmentation.

Popliteal Vein: No evidence of thrombus. Normal compressibility,
respiratory phasicity and response to augmentation.

Calf Veins: No evidence of thrombus. Normal compressibility and flow
on color Doppler imaging.

Superficial Great Saphenous Vein: No evidence of thrombus. Normal
compressibility and flow on color Doppler imaging.

Venous Reflux:  None.

Other Findings: No evidence of superficial thrombophlebitis or
abnormal fluid collection.
IMPRESSION: No evidence of left lower extremity deep venous thrombosis.

## 2015-12-18 ENCOUNTER — Telehealth: Payer: Self-pay | Admitting: *Deleted

## 2015-12-18 ENCOUNTER — Other Ambulatory Visit: Payer: Self-pay

## 2015-12-18 NOTE — Telephone Encounter (Signed)
PA for Eliquis 5mg  BID initiated via fax to [facility] (form given to [facility] to add NPI# & fax)

## 2015-12-19 NOTE — Telephone Encounter (Signed)
PA approved through 06/18/2016

## 2015-12-28 ENCOUNTER — Ambulatory Visit (INDEPENDENT_AMBULATORY_CARE_PROVIDER_SITE_OTHER): Payer: Medicare Other | Admitting: Family Medicine

## 2015-12-28 ENCOUNTER — Encounter: Payer: Self-pay | Admitting: Family Medicine

## 2015-12-28 VITALS — BP 126/72 | HR 87 | Temp 98.6°F | Ht 66.0 in | Wt 202.6 lb

## 2015-12-28 DIAGNOSIS — G8929 Other chronic pain: Secondary | ICD-10-CM | POA: Diagnosis not present

## 2015-12-28 DIAGNOSIS — M545 Low back pain, unspecified: Secondary | ICD-10-CM

## 2015-12-28 DIAGNOSIS — I201 Angina pectoris with documented spasm: Secondary | ICD-10-CM

## 2015-12-28 DIAGNOSIS — R1033 Periumbilical pain: Secondary | ICD-10-CM

## 2015-12-28 LAB — URINALYSIS, MICROSCOPIC ONLY

## 2015-12-28 LAB — POCT URINALYSIS DIPSTICK
Blood, UA: 10
Glucose, UA: NEGATIVE
Ketones, UA: NEGATIVE
Leukocytes, UA: NEGATIVE
Nitrite, UA: NEGATIVE
Spec Grav, UA: >=1.03
Urobilinogen, UA: 0.2
pH, UA: 6

## 2015-12-28 LAB — COMPREHENSIVE METABOLIC PANEL
ALT: 15 U/L (ref 0–35)
AST: 16 U/L (ref 0–37)
Albumin: 4.3 g/dL (ref 3.5–5.2)
Alkaline Phosphatase: 104 U/L (ref 39–117)
BUN: 10 mg/dL (ref 6–23)
CO2: 30 mEq/L (ref 19–32)
Calcium: 9.6 mg/dL (ref 8.4–10.5)
Chloride: 103 mEq/L (ref 96–112)
Creatinine, Ser: 0.87 mg/dL (ref 0.40–1.20)
GFR: 84.92 mL/min (ref >60.00)
Glucose, Bld: 110 mg/dL — ABNORMAL HIGH (ref 70–99)
Potassium: 4.4 mEq/L (ref 3.5–5.1)
Sodium: 138 mEq/L (ref 135–145)
Total Bilirubin: 0.5 mg/dL (ref 0.2–1.2)
Total Protein: 7.9 g/dL (ref 6.0–8.3)

## 2015-12-28 LAB — CBC
HCT: 35.8 % — ABNORMAL LOW (ref 36.0–46.0)
Hemoglobin: 11.9 g/dL — ABNORMAL LOW (ref 12.0–15.0)
MCHC: 33.2 g/dL (ref 30.0–36.0)
MCV: 89.1 fl (ref 78.0–100.0)
Platelets: 425 10*3/uL — ABNORMAL HIGH (ref 150.0–400.0)
RBC: 4.02 Mil/uL (ref 3.87–5.11)
RDW: 14.5 % (ref 11.5–15.5)
WBC: 7.5 10*3/uL (ref 4.0–10.5)

## 2015-12-28 LAB — LIPASE: Lipase: 1 U/L — ABNORMAL LOW (ref 11.0–59.0)

## 2015-12-28 LAB — POCT URINE PREGNANCY: Preg Test, Ur: NEGATIVE

## 2015-12-28 NOTE — Patient Instructions (Signed)
Nice to see you. We are going to send your urine for culture and order some lab work to evaluate her abdominal discomfort. Please use your constipation medication to help have a bowel movement every day. Please follow up with the neurologist as a discussed. If you develop worsening abdominal discomfort, nausea, vomiting, diarrhea, blood in her stool, fevers, worsening back pain, numbness, weakness, loss of bowel or bladder function, or any new or changing symptoms please seek medical attention.

## 2015-12-28 NOTE — Progress Notes (Signed)
Pre visit review using our clinic review tool, if applicable. No additional management support is needed unless otherwise documented below in the visit note. 

## 2015-12-28 NOTE — Assessment & Plan Note (Signed)
Chronic low back pain. This is unchanged from previously. Benign back exam today. 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 Has seen neurology for this and they noted no obvious findings on the MRI that could lead to her symptoms of numbness and bowel incontinence. She notes all these issues are stable and unchanged. Discussed that she needs to follow-up with neurology and her neurosurgeon to discuss further management of these issues. She is given return precautions.

## 2015-12-28 NOTE — Progress Notes (Signed)
Patient ID: Valerie Vazquez, female   DOB: 09-05-1955, 60 y.o.   MRN: [id number]  [name], MD Phone: [phone]  Valerie Vazquez is a 60 y.o. female who presents today for same-day visit.  Patient notes for the last 7 days she's had discomfort in her abdomen. Started out around her navel and then radiated into her suprapubic region. Notes it is intermittent. Has some cramping. Notes it comes out of nowhere. She has had some nausea with it. No vomiting, diarrhea, blood in her stool, dysuria, urinary frequency, vaginal discharge, fevers, or diarrhea. She notes some urgency though this is chronic and not new. She has a bowel movement every 3-4 days and describes them as brown round balls followed by tubes of stool. She does note occasional stool leakage. This is at baseline and unchanged. She notes her back pain is at baseline as well. It is in her lower back. She's not having fevers. She does note some numbness at the base of her back and possibly over her vagina that is at her baseline. She has had an MRI in the last month that had no explanatory findings for these issues. She's seen neurology for the back pain and numbness and the plan was to obtain an MRI of her brain. She has not followed through on this. She does not have a uterus. She does not have an appendix. She does not have a gallbladder. She does still have her ovaries. Her last menstrual period was in January 1985. She does occasionally have some bleeding after sexual intercourse.  PMH: Former smoker   ROS see history of present illness  Objective  Physical Exam Filed Vitals:   12/28/15 1401  BP: 126/72  Pulse: 87  Temp: 98.6 F (37 C)    BP Readings from Last 3 Encounters:  12/28/15 126/72  11/19/15 138/86  11/12/15 130/88   Wt Readings from Last 3 Encounters:  12/28/15 202 lb 9.6 oz (91.899 kg)  11/19/15 209 lb 6.4 oz (94.983 kg)  11/12/15 211 lb 8 oz (95.936 kg)    Physical Exam  Constitutional:  She is well-developed, well-nourished, and in no distress.  HENT:  Head: Normocephalic and atraumatic.  Cardiovascular: Normal rate, regular rhythm and normal heart sounds.   Pulmonary/Chest: Effort normal and breath sounds normal.  Abdominal: Soft. Bowel sounds are normal. She exhibits no distension. There is tenderness (minimal epigastric discomfort on palpation). There is no rebound and no guarding.  Musculoskeletal:  No midline spine tenderness, no midline spine step-off, no muscular back tenderness  Neurological: She is alert. Gait normal.  5 out of 5 strength bilateral quads, hamstrings, plantar flexion, and dorsiflexion, sensation to light touch intact bilaterally lower extremities, 2+ patellar reflexes  Skin: Skin is warm and dry. She is not diaphoretic.     Assessment/Plan: Please see individual problem list.  Abdominal pain 7 days of abdominal discomfort. Relatively benign exam with only mild epigastric tenderness. She declined a pelvic exam today. Urine had small amount of blood and no other signs concerning for infection. Urine pregnancy test was negative. Suspect this could be related to constipation given that she only has a bowel movement every 3-4 days. Discussed potential treatments for constipation. She notes she wants to use Ex-Lax that she has at home followed by salt and pepper mixed with water as this has worked for her previously. We'll obtain lab work as outlined below. We'll send her urine for culture and microscopy. She's given return precautions.  Chronic lumbosacral pain  Chronic low back pain. This is unchanged from previously. Benign back exam today. 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 Has seen neurology for this and they noted no obvious findings on the MRI that could lead to her symptoms of numbness and bowel incontinence. She notes all these issues are stable and unchanged. Discussed that she needs to follow-up with neurology and her  neurosurgeon to discuss further management of these issues. She is given return precautions.    Orders Placed This Encounter  Procedures  . Urine Culture  . Comp Met (CMET)  . CBC  . Lipase  . Urine Microscopic Only  . POCT Urinalysis Dipstick  . POCT urine pregnancy    [name], MD [place]

## 2015-12-28 NOTE — Assessment & Plan Note (Signed)
7 days of abdominal discomfort. Relatively benign exam with only mild epigastric tenderness. She declined a pelvic exam today. Urine had small amount of blood and no other signs concerning for infection. Urine pregnancy test was negative. Suspect this could be related to constipation given that she only has a bowel movement every 3-4 days. Discussed potential treatments for constipation. She notes she wants to use Ex-Lax that she has at home followed by salt and pepper mixed with water as this has worked for her previously. We'll obtain lab work as outlined below. We'll send her urine for culture and microscopy. She's given return precautions.

## 2015-12-29 ENCOUNTER — Other Ambulatory Visit: Payer: Self-pay | Admitting: Physician Assistant

## 2015-12-30 LAB — URINE CULTURE
Colony Count: NO GROWTH
Organism ID, Bacteria: NO GROWTH

## 2015-12-31 ENCOUNTER — Other Ambulatory Visit: Payer: Self-pay | Admitting: Neurosurgery

## 2015-12-31 ENCOUNTER — Telehealth: Payer: Self-pay | Admitting: Family Medicine

## 2015-12-31 DIAGNOSIS — M5021 Other cervical disc displacement,  high cervical region: Secondary | ICD-10-CM

## 2015-12-31 DIAGNOSIS — G8929 Other chronic pain: Secondary | ICD-10-CM

## 2015-12-31 DIAGNOSIS — M545 Low back pain, unspecified: Secondary | ICD-10-CM

## 2015-12-31 NOTE — Telephone Encounter (Signed)
Spoke with the patient, reviewed labs, see result note.

## 2015-12-31 NOTE — Telephone Encounter (Signed)
Pt is calling for her labs results. Please call her at home.

## 2016-01-01 ENCOUNTER — Encounter: Payer: Self-pay | Admitting: *Deleted

## 2016-01-01 ENCOUNTER — Ambulatory Visit: Payer: Self-pay | Admitting: Pulmonary Disease

## 2016-01-09 ENCOUNTER — Ambulatory Visit (INDEPENDENT_AMBULATORY_CARE_PROVIDER_SITE_OTHER): Payer: Medicare Other | Admitting: Family Medicine

## 2016-01-09 ENCOUNTER — Encounter: Payer: Self-pay | Admitting: Emergency Medicine

## 2016-01-09 ENCOUNTER — Other Ambulatory Visit: Payer: Self-pay

## 2016-01-09 ENCOUNTER — Emergency Department: Payer: Medicare Other

## 2016-01-09 ENCOUNTER — Emergency Department
Admission: EM | Admit: 2016-01-09 | Discharge: 2016-01-09 | Disposition: A | Discharge status: Home / Self Care | Payer: Medicare Other | Attending: Emergency Medicine | Admitting: Emergency Medicine

## 2016-01-09 ENCOUNTER — Encounter: Payer: Self-pay | Admitting: Family Medicine

## 2016-01-09 VITALS — BP 158/94 | HR 98 | Temp 98.7°F | Ht 66.0 in | Wt 204.0 lb

## 2016-01-09 DIAGNOSIS — F251 Schizoaffective disorder, depressive type: Secondary | ICD-10-CM | POA: Diagnosis not present

## 2016-01-09 DIAGNOSIS — I2699 Other pulmonary embolism without acute cor pulmonale: Secondary | ICD-10-CM | POA: Insufficient documentation

## 2016-01-09 DIAGNOSIS — I252 Old myocardial infarction: Secondary | ICD-10-CM | POA: Insufficient documentation

## 2016-01-09 DIAGNOSIS — E119 Type 2 diabetes mellitus without complications: Secondary | ICD-10-CM | POA: Diagnosis not present

## 2016-01-09 DIAGNOSIS — I201 Angina pectoris with documented spasm: Secondary | ICD-10-CM | POA: Diagnosis not present

## 2016-01-09 DIAGNOSIS — Z79899 Other long term (current) drug therapy: Secondary | ICD-10-CM | POA: Insufficient documentation

## 2016-01-09 DIAGNOSIS — R079 Chest pain, unspecified: Secondary | ICD-10-CM

## 2016-01-09 DIAGNOSIS — R0602 Shortness of breath: Secondary | ICD-10-CM | POA: Diagnosis not present

## 2016-01-09 DIAGNOSIS — Z87891 Personal history of nicotine dependence: Secondary | ICD-10-CM | POA: Insufficient documentation

## 2016-01-09 DIAGNOSIS — Z7984 Long term (current) use of oral hypoglycemic drugs: Secondary | ICD-10-CM | POA: Diagnosis not present

## 2016-01-09 DIAGNOSIS — M1990 Unspecified osteoarthritis, unspecified site: Secondary | ICD-10-CM | POA: Diagnosis not present

## 2016-01-09 DIAGNOSIS — I10 Essential (primary) hypertension: Secondary | ICD-10-CM | POA: Insufficient documentation

## 2016-01-09 DIAGNOSIS — R0789 Other chest pain: Secondary | ICD-10-CM | POA: Diagnosis not present

## 2016-01-09 DIAGNOSIS — R5381 Other malaise: Secondary | ICD-10-CM | POA: Diagnosis not present

## 2016-01-09 LAB — CBC WITH DIFFERENTIAL/PLATELET
Basophils Absolute: 0 10*3/uL (ref 0–0.1)
Basophils Relative: 1 %
Eosinophils Absolute: 0.2 10*3/uL (ref 0–0.7)
Eosinophils Relative: 3 %
HCT: 35.1 % (ref 35.0–47.0)
Hemoglobin: 11.8 g/dL — ABNORMAL LOW (ref 12.0–16.0)
Lymphocytes Relative: 33 %
Lymphs Abs: 2.5 10*3/uL (ref 1.0–3.6)
MCH: 29.7 pg (ref 26.0–34.0)
MCHC: 33.5 g/dL (ref 32.0–36.0)
MCV: 88.4 fL (ref 80.0–100.0)
Monocytes Absolute: 0.5 10*3/uL (ref 0.2–0.9)
Monocytes Relative: 7 %
Neutro Abs: 4.3 10*3/uL (ref 1.4–6.5)
Neutrophils Relative %: 56 %
Platelets: 339 10*3/uL (ref 150–440)
RBC: 3.97 MIL/uL (ref 3.80–5.20)
RDW: 14.3 % (ref 11.5–14.5)
WBC: 7.6 10*3/uL (ref 3.6–11.0)

## 2016-01-09 LAB — BASIC METABOLIC PANEL
Anion gap: 5 (ref 5–15)
BUN: 13 mg/dL (ref 6–20)
CO2: 29 mmol/L (ref 22–32)
Calcium: 9.7 mg/dL (ref 8.9–10.3)
Chloride: 107 mmol/L (ref 101–111)
Creatinine, Ser: 0.81 mg/dL (ref 0.44–1.00)
GFR calc Af Amer: >60 mL/min (ref >60)
GFR calc non Af Amer: >60 mL/min (ref >60)
Glucose, Bld: 91 mg/dL (ref 65–99)
Potassium: 4.5 mmol/L (ref 3.5–5.1)
Sodium: 141 mmol/L (ref 135–145)

## 2016-01-09 LAB — TROPONIN I
Troponin I: <0.03 ng/mL (ref <0.031)
Troponin I: <0.03 ng/mL (ref <0.031)

## 2016-01-09 LAB — TSH: TSH: 2.756 u[IU]/mL (ref 0.350–4.500)

## 2016-01-09 MED ORDER — NITROGLYCERIN 0.4 MG SL SUBL: 0.4000 mg | SUBLINGUAL_TABLET | SUBLINGUAL | Status: DC | PRN

## 2016-01-09 NOTE — Assessment & Plan Note (Signed)
Patient with intermittent chest pain over the last several days with recent onset of shortness of breath and diaphoresis. Blood pressure is elevated as well. Concern is for ACS. EKG does not have any ST or T-wave changes. Patient was given one sublingual nitroglycerin with mild improvement in symptoms. EMS was contacted to transport the patient to the emergency room for further evaluation. CMA called the charge nurse to inform them the patient was on her way.

## 2016-01-09 NOTE — ED Notes (Signed)
Pt reports at around 1330 today as she was getting ready, felt like she was having a hot flash, but this lasted longer than her typical hot flash.  Pt reports going to PCP, hot, sweaty feeling continued as well as feeling nauseas.  Pt reports having chest pain with shortness of breath for approximately 3-4 days earlier this week.  Pt A/Ox4, reports "intermittent mild pressure" in her chest at this time.  Pt given 1 nitro per PCP.  Pt with hx of uncontrolled type 2 diabetes per EMS.

## 2016-01-09 NOTE — Progress Notes (Signed)
Patient ID: Valerie Vazquez, female   DOB: 1956-07-30, 60 y.o.   MRN: [id number]  [name], MD Phone: [phone]  Valerie Vazquez is a 59 y.o. female who presents today for same-day visit.  Patient notes onset of intermittent chest pain over the last couple of days. Central radiating to her neck. About 15 minutes before arriving to the office she developed diaphoresis and shortness of breath. No chest pain at this time. She notes some nausea as well but no vomiting. She has had a catheterization about a year ago due to somewhat similar symptoms that revealed coronary vasospasm. 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 She is taking Eliquis.  PMH: Smoker.  ROS see history of present illness  Objective  Physical Exam Filed Vitals:   01/09/16 1402  BP: 158/94  Pulse: 98  Temp: 98.7 F (37.1 C)    BP Readings from Last 3 Encounters:  01/09/16 158/94  12/28/15 126/72  11/19/15 138/86   Wt Readings from Last 3 Encounters:  01/09/16 204 lb (92.534 kg)  12/28/15 202 lb 9.6 oz (91.899 kg)  11/19/15 209 lb 6.4 oz (94.983 kg)    Physical Exam  Constitutional:  Tired-appearing  HENT:  Head: Normocephalic and atraumatic.  Cardiovascular: Normal rate, regular rhythm and normal heart sounds.   Pulmonary/Chest: Effort normal and breath sounds normal.  Neurological: She is alert.  Skin: Skin is warm. She is diaphoretic.   EKG: Normal sinus rhythm, rate 95, no ST or T-wave changes  Assessment/Plan: Please see individual problem list.  Chest pain Patient with intermittent chest pain over the last several days with recent onset of shortness of breath and diaphoresis. Blood pressure is elevated as well. Concern is for ACS. EKG does not have any ST or T-wave changes. Patient was given one sublingual nitroglycerin with mild improvement in symptoms. EMS was contacted to transport the patient to the emergency room for further evaluation. CMA called the charge  nurse to inform them the patient was on her way.    Orders Placed This Encounter  Procedures  . EKG 12-Lead    Meds ordered this encounter  Medications  . nitroGLYCERIN (NITROSTAT) 0.4 MG SL tablet    Sig: Place 1 tablet (0.4 mg total) under the tongue every 5 (five) minutes as needed for chest pain.    [name], MD [place]

## 2016-01-09 NOTE — ED Provider Notes (Signed)
[facility] Emergency Department Provider Note   ____________________________________________  Time seen: Approximately 310 PM  I have reviewed the triage vital signs and the nursing notes.   HISTORY  Chief Complaint Fatigue   HPI Valerie Vazquez is a 60 y.o. female with a history of hypertension as well as coronary vasospasm and was presenting to the emergency department today with 3-4 days of chest pain as well as shortness of breath and diaphoresis earlier today. Says for the past 3 days she had constant and severe midsternal chest pain that radiated to her jaw. She said that the pain abated. Was not associated with any vomiting but was associated with nausea as well as shortness of breath. Said the pain could start when she was at rest. This morning she also had an additional one hour episode where she was diaphoretic with shortness of breath. During that time she had a chest pressure that did not radiate. She went to her PCPs office and was given one nitroglycerin. Says that she feels relieved of all symptoms at this time. In April 2016 she had a cardiac catheterization which did not reveal any coronary artery disease.  She was thought to be having vasospasm and was started on Imdur as well as diltiazem. She is taking both of these as prescribed. She also says that she is on eliquis for pulmonary embolus. Says that it does not feel similar to her pulmonary embolism.Patient says she has been under increased stress lately because she has to move from her apartment complex. Says she also has esophageal spasm but this did not feel exactly like that either. Patient was also seen her primary care doctors earlier today and given one nitroglycerin which helped relieve her symptoms.   Past Medical History  Diagnosis Date  . Spinal stenosis   . Hypertension   . Pulmonary embolism ([place]) 04/2001; 09/19/2014    "after gallbladder OR; "  . High cholesterol   .  Myocardial infarction ([place]) 10/2010 X 3    "while hospitalized"  . Pneumonia 04/2014  . Sleep apnea     "mild" (09/19/2014)  . Type II diabetes mellitus ([place])     "dx'd in 2007; lost weight; no RX for ~ 4 yr now" (09/19/2014)  . Anemia   . History of blood transfusion 1985    related to hysterectomy  . Esophageal spasm   . Gastritis   . Gastroenteritis   . IBS (irritable bowel syndrome)   . GERD (gastroesophageal reflux disease)   . Headache     "couple /month lately" (09/19/2014)  . Migraine     "a few times/yr" (09/19/2014)  . Osteoarthritis     "qwhere; mostly around my joints" (09/19/2014)  . Spondylosis   . Chronic back pain     "upper and lower" (09/19/2014)  . Depression     "major" (09/19/2014)  . PTSD (post-traumatic stress disorder)   . Schizoaffective disorder ([place])   . Snoring     a. sleep study 5/16: No OSA  . High blood pressure   . Bilateral renal cysts   . Gallstones   . DVT (deep venous thrombosis) [facility])     Patient Active Problem List   Diagnosis Date Noted  . Numbness 11/19/2015  . Right wrist pain 11/09/2015  . Breast pain 10/19/2015  . Chest pain 10/14/2015  . Headache 10/14/2015  . Abdominal pain 10/14/2015  . Depression 10/14/2015  . Lip swelling 10/14/2015  . Pulmonary embolism ([place]) 08/15/2015  .  Bilateral pneumonia 08/15/2015  . Nausea 08/15/2015  . Shortness of breath 07/31/2015  . Pleural effusion, bilateral 07/31/2015  . Cough 07/31/2015  . Chronic pain of multiple sites 06/05/2015  . Chronic lumbosacral pain 06/05/2015  . Esophageal spasm 06/05/2015  . History of DVT (deep vein thrombosis) 02/25/2015  . 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 Constipation 12/12/2014  . Coronary vasospasm ([place]) 12/09/2014  . History of pulmonary embolus (PE) 09/19/2014  . Impingement syndrome of shoulder 08/30/2014  . Injury of superior glenoid labrum of shoulder joint 08/16/2014  . Supraspinatus tenosynovitis 08/16/2014  . Cervical spondylosis without  myelopathy 08/16/2014  . Anxiety and depression 04/13/2014  . Osteoarthritis of left hip 04/11/2014  . Status post left hip replacement 04/11/2014  . IBS (irritable bowel syndrome) 04/11/2014  . GERD (gastroesophageal reflux disease) 04/11/2014  . Insomnia 04/11/2014  . Essential hypertension, benign 10/28/2013  . Left hip pain 10/28/2013  . Anemia 10/28/2013  . HLD (hyperlipidemia) 10/28/2013    Past Surgical History  Procedure Laterality Date  . Excision/release bursa hip Right 12/1984  . Total hip arthroplasty Left 04-06-2014  . Tonsillectomy and adenoidectomy  ~ 1968  . Appendectomy  1985  . Abdominal hysterectomy  1985  . Cholecystectomy  2002  . Hernia repair  1985  . Umbilical hernia repair  1985  . Vena cava filter placement  03/2014  . Back surgery    . Anterior cervical decomp/discectomy fusion  10/2012  . Breast cyst excision Right   . Cardiac catheterization   2000's; 2009  . Left heart catheterization with coronary angiogram N/A 12/09/2014    Procedure: LEFT HEART CATHETERIZATION WITH CORONARY ANGIOGRAM;  Surgeon: Burnell Blanks, MD;  Location: [facility] CATH LAB;  Service: Cardiovascular;  Laterality: N/A;    Current Outpatient Rx  Name  Route  Sig  Dispense  Refill  . apixaban (ELIQUIS) 5 MG TABS tablet   Oral   Take 1 tablet (5 mg total) by mouth 2 (two) times daily.   60 tablet   3   . BIOTIN PO   Oral   Take 1 tablet by mouth daily.         . calcium carbonate (TUMS - DOSED IN MG ELEMENTAL CALCIUM) 500 MG chewable tablet   Oral   Chew 1 tablet by mouth daily as needed for indigestion or heartburn.         . carvedilol (COREG) 3.125 MG tablet   Oral   Take 1 tablet (3.125 mg total) by mouth 2 (two) times daily with a meal.   60 tablet   11   . cyclobenzaprine (FLEXERIL) 10 MG tablet   Oral   Take 1 tablet (10 mg total) by mouth 3 (three) times daily as needed for muscle spasms.   100 tablet   5   . dicyclomine (BENTYL) 10 MG capsule       TAKE 2 CAPSULES BY MOUTH EVERY SIX HOURS   240 capsule   5   . diltiazem (CARDIZEM CD) 120 MG 24 hr capsule   Oral   Take 1 capsule (120 mg total) by mouth daily.   90 capsule   1   . diphenhydrAMINE (BENADRYL) 25 MG tablet   Oral   Take 1-2 tablets (25-50 mg total) by mouth every 6 (six) hours as needed for itching (Rash).   30 tablet   0   . EPINEPHrine 0.3 mg/0.3 mL IJ SOAJ injection   Intramuscular   Inject 0.3 mLs (0.3 mg total) into  the muscle once.   1 Device   0   . escitalopram (LEXAPRO) 10 MG tablet      TAKE 1 TABLET BY MOUTH EVERY DAY   30 tablet   5   . famotidine (PEPCID) 20 MG tablet   Oral   Take 1 tablet (20 mg total) by mouth 2 (two) times daily. Take for itching   10 tablet   0   . furosemide (LASIX) 20 MG tablet      TAKE 1 TABLET BY MOUTH EVERY DAY   90 tablet   2   . gabapentin (NEURONTIN) 600 MG tablet   Oral   Take 1 tablet (600 mg total) by mouth every 8 (eight) hours.   90 tablet   5   . hydrOXYzine (ATARAX/VISTARIL) 25 MG tablet   Oral   Take 1 tablet (25 mg total) by mouth every 6 (six) hours as needed for itching.   12 tablet   0   . isosorbide mononitrate (IMDUR) 30 MG 24 hr tablet   Oral   Take 1 tablet (30 mg total) by mouth daily.   30 tablet   11   . Multiple Vitamin (MULTIVITAMIN WITH MINERALS) TABS tablet   Oral   Take 2 tablets by mouth daily.          . nitroGLYCERIN (NITROSTAT) 0.4 MG SL tablet   Sublingual   Place 1 tablet (0.4 mg total) under the tongue every 5 (five) minutes as needed for chest pain.         Marland Kitchen ondansetron (ZOFRAN) 4 MG tablet   Oral   Take 1 tablet (4 mg total) by mouth every 8 (eight) hours as needed for nausea or vomiting.   90 tablet   5   . oxyCODONE-acetaminophen (PERCOCET/ROXICET) 5-325 MG tablet   Oral   Take 1 tablet by mouth 2 (two) times daily as needed for severe pain.   60 tablet   0   . pantoprazole (PROTONIX) 40 MG tablet      TAKE ONE TABLET BY MOUTH EVERY  DAY   30 tablet   5     Refilled 09/03/15   . polyethylene glycol powder (GLYCOLAX/MIRALAX) powder   Oral   Take 17 g by mouth 2 (two) times daily as needed.   3350 g   1   . potassium chloride (K-DUR) 10 MEQ tablet   Oral   Take 1 tablet (10 mEq total) by mouth 2 (two) times daily.   60 tablet   11   . traZODone (DESYREL) 50 MG tablet   Oral   Take 1 tablet (50 mg total) by mouth at bedtime. Patient taking differently: Take 50 mg by mouth at bedtime as needed for sleep.    30 tablet   5   . triamcinolone (NASACORT AQ) 55 MCG/ACT AERO nasal inhaler   Nasal   Place 2 sprays into the nose daily.   1 Inhaler   12     Allergies Abilify; Augmentin; Bactrim; Ceftin; Coconut oil; Diclofenac; Indocin; Linaclotide; Lisinopril; Morphine; Morphine and related; Simvastatin; Sulfa antibiotics; Sulfamethoxazole-trimethoprim; Wellbutrin; and Quetiapine fumarate  Family History  Problem Relation Age of Onset  . Heart attack Neg Hx   . Stroke Mother     Deceased  . Lung cancer Father     Deceased  . Healthy Daughter   . Healthy Son     x 2  . Other Son     Suicide    Social  History Social History  Substance Use Topics  . Smoking status: Former Smoker -- 1.50 packs/day for 10 years    Types: Cigarettes    Quit date: 04/12/1979  . Smokeless tobacco: Never Used  . Alcohol Use: No    Review of Systems Constitutional: No fever/chills Eyes: No visual changes. ENT: No sore throat. Cardiovascular: As above Respiratory: As above Gastrointestinal: No abdominal pain.   no vomiting.  No diarrhea.  No constipation. Genitourinary: Negative for dysuria. Musculoskeletal: Negative for back pain. Skin: Negative for rash. Neurological: Negative for headaches, focal weakness or numbness.  10-point ROS otherwise negative.  ____________________________________________   PHYSICAL EXAM:  VITAL SIGNS: ED Triage Vitals  Enc Vitals Group     BP 01/09/16 1507 135/86 mmHg     Pulse  Rate 01/09/16 1507 78     Resp 01/09/16 1507 20     Temp 01/09/16 1507 98 F (36.7 C)     Temp Source 01/09/16 1507 Oral     SpO2 01/09/16 1507 97 %     Weight 01/09/16 1507 204 lb (92.534 kg)     Height 01/09/16 1507 5\' 6"  (1.676 m)     Head Cir --      Peak Flow --      Pain Score 01/09/16 1508 3     Pain Loc --      Pain Edu? --      Excl. in [place]? --     Constitutional: Alert and oriented. Well appearing and in no acute distress. Eyes: Conjunctivae are normal. PERRL. EOMI. Head: Atraumatic. Nose: No congestion/rhinnorhea. Mouth/Throat: Mucous membranes are moist.   Neck: No stridor.   Cardiovascular: Normal rate, regular rhythm. Grossly normal heart sounds.  Good peripheral circulation.Chest pain is not reproducible to palpation. Respiratory: Normal respiratory effort.  No retractions. Lungs CTAB. Gastrointestinal: Soft and nontender. No distention. No abdominal bruits. No CVA tenderness. Musculoskeletal: No lower extremity tenderness nor edema.  No joint effusions. Neurologic:  Normal speech and language. No gross focal neurologic deficits are appreciated.  Skin:  Skin is warm, dry and intact. No rash noted. Psychiatric: Mood and affect are normal. Speech and behavior are normal.  ____________________________________________   LABS (all labs ordered are listed, but only abnormal results are displayed)  Labs Reviewed  CBC WITH DIFFERENTIAL/PLATELET - Abnormal; Notable for the following:    Hemoglobin 11.8 (*)    All other components within normal limits  BASIC METABOLIC PANEL  TROPONIN I  TROPONIN I  TSH   ____________________________________________  EKG  ED ECG REPORT I, Doran Stabler, the attending physician, personally viewed and interpreted this ECG.   Date: 01/09/2016  EKG Time: 1516  Rate: 76  Rhythm: normal sinus rhythm  Axis: Normal  Intervals:none  ST&T Change: No ST segment elevation or depression. No abnormal T-wave  inversion.  ____________________________________________  [id number]  DG Chest 2 View (Final result) Result time: 01/09/16 16:15:24   Final result by Rad Results In Interface (01/09/16 16:15:24)   Narrative:   CLINICAL DATA: Chest pain and shortness breath for 4 days. Nausea and diaphoresis.  EXAM: CHEST 2 VIEW  COMPARISON: 08/15/2015  FINDINGS: The heart size and mediastinal contours are within normal limits. Both lungs are clear. No evidence of pleural effusion or pneumothorax. Again noted are cervical spine fusion hardware, IVC filter, right upper quadrant surgical clips.  IMPRESSION: No active cardiopulmonary disease.   Electronically Signed By: Earle Gell M.D. On: 01/09/2016 16:15    ____________________________________________   PROCEDURES  ____________________________________________   INITIAL IMPRESSION / ASSESSMENT AND PLAN / ED COURSE  Pertinent labs & imaging results that were available during my care of the patient were reviewed by me and considered in my medical decision making (see chart for details).  ----------------------------------------- 9:49 PM on 01/09/2016 -----------------------------------------  Patient with very reassuring lab work. Patient without any chest pain or shortness of breath at this time. Unclear cause of the patient's symptoms but given her history this is possibly from her vasospasm.  Very reassuring workup first given a cardiac disease. Says this does not feel like her past PEs and she has not had PE since she has been on eliquis. I discussed the case with cardiologist on call for the group, Dr. Caryl Comes, who deferred to me for patient's appropriate as for outpatient treatment. However, she has no pain at this time. Is resting completely without any symptoms for hours now in the emergency department. Will be discharged home. Ice 20 minutes and the patient will follow-up in the office with her cardiologist and she is  understanding willing to comply per to also return to the hospital for any further worsening or concerning symptoms.   ____________________________________________   FINAL CLINICAL IMPRESSION(S) / ED DIAGNOSES  Chest pain.    NEW MEDICATIONS STARTED DURING THIS VISIT:  New Prescriptions   No medications on file     Note:  This document was prepared using Dragon voice recognition software and may include unintentional dictation errors.    Orbie Pyo, MD 01/09/16 2151

## 2016-01-09 NOTE — Progress Notes (Signed)
Pre visit review using our clinic review tool, if applicable. No additional management support is needed unless otherwise documented below in the visit note. 

## 2016-01-18 ENCOUNTER — Encounter (HOSPITAL_COMMUNITY): Payer: Self-pay | Admitting: Emergency Medicine

## 2016-01-18 ENCOUNTER — Emergency Department (HOSPITAL_COMMUNITY)
Admission: EM | Admit: 2016-01-18 | Discharge: 2016-01-18 | Disposition: A | Discharge status: Home / Self Care | Payer: Medicare Other | Attending: Emergency Medicine | Admitting: Emergency Medicine

## 2016-01-18 DIAGNOSIS — Z9889 Other specified postprocedural states: Secondary | ICD-10-CM | POA: Diagnosis not present

## 2016-01-18 DIAGNOSIS — Z86711 Personal history of pulmonary embolism: Secondary | ICD-10-CM | POA: Diagnosis not present

## 2016-01-18 DIAGNOSIS — Z7901 Long term (current) use of anticoagulants: Secondary | ICD-10-CM | POA: Insufficient documentation

## 2016-01-18 DIAGNOSIS — Z86718 Personal history of other venous thrombosis and embolism: Secondary | ICD-10-CM | POA: Diagnosis not present

## 2016-01-18 DIAGNOSIS — K029 Dental caries, unspecified: Secondary | ICD-10-CM | POA: Diagnosis not present

## 2016-01-18 DIAGNOSIS — Q6102 Congenital multiple renal cysts: Secondary | ICD-10-CM | POA: Insufficient documentation

## 2016-01-18 DIAGNOSIS — M1990 Unspecified osteoarthritis, unspecified site: Secondary | ICD-10-CM | POA: Insufficient documentation

## 2016-01-18 DIAGNOSIS — G8929 Other chronic pain: Secondary | ICD-10-CM | POA: Diagnosis not present

## 2016-01-18 DIAGNOSIS — K0889 Other specified disorders of teeth and supporting structures: Secondary | ICD-10-CM | POA: Diagnosis not present

## 2016-01-18 DIAGNOSIS — I10 Essential (primary) hypertension: Secondary | ICD-10-CM | POA: Diagnosis not present

## 2016-01-18 DIAGNOSIS — G43909 Migraine, unspecified, not intractable, without status migrainosus: Secondary | ICD-10-CM | POA: Diagnosis not present

## 2016-01-18 DIAGNOSIS — F329 Major depressive disorder, single episode, unspecified: Secondary | ICD-10-CM | POA: Diagnosis not present

## 2016-01-18 DIAGNOSIS — E119 Type 2 diabetes mellitus without complications: Secondary | ICD-10-CM | POA: Diagnosis not present

## 2016-01-18 DIAGNOSIS — F4310 Post-traumatic stress disorder, unspecified: Secondary | ICD-10-CM | POA: Insufficient documentation

## 2016-01-18 DIAGNOSIS — Z79899 Other long term (current) drug therapy: Secondary | ICD-10-CM | POA: Diagnosis not present

## 2016-01-18 DIAGNOSIS — I252 Old myocardial infarction: Secondary | ICD-10-CM | POA: Diagnosis not present

## 2016-01-18 DIAGNOSIS — K219 Gastro-esophageal reflux disease without esophagitis: Secondary | ICD-10-CM | POA: Diagnosis not present

## 2016-01-18 DIAGNOSIS — Z7952 Long term (current) use of systemic steroids: Secondary | ICD-10-CM | POA: Insufficient documentation

## 2016-01-18 DIAGNOSIS — Z87891 Personal history of nicotine dependence: Secondary | ICD-10-CM | POA: Insufficient documentation

## 2016-01-18 DIAGNOSIS — F259 Schizoaffective disorder, unspecified: Secondary | ICD-10-CM | POA: Diagnosis not present

## 2016-01-18 DIAGNOSIS — H9203 Otalgia, bilateral: Secondary | ICD-10-CM | POA: Insufficient documentation

## 2016-01-18 MED ORDER — ACETAMINOPHEN-CODEINE #3 300-30 MG PO TABS: 1.0000 | ORAL_TABLET | Freq: Four times a day (QID) | ORAL | Status: DC | PRN

## 2016-01-18 MED ORDER — PENICILLIN V POTASSIUM 500 MG PO TABS: 500.0000 mg | ORAL_TABLET | Freq: Three times a day (TID) | ORAL | Status: DC

## 2016-01-18 NOTE — ED Notes (Signed)
Patient able to ambulate independently with walker, per baseline

## 2016-01-18 NOTE — ED Notes (Signed)
Pt sts left sided dental pain with possible abscess

## 2016-01-18 NOTE — ED Provider Notes (Signed)
CSN: [id number]     Arrival date & time 01/18/16  1556 History  By signing my name below, I, [facility], attest that this documentation has been prepared under the direction and in the presence of Domenic Moras, PA-C. Electronically Signed: Virgel Bouquet, ED Scribe. 01/18/2016. 4:40 PM.   Chief Complaint  Patient presents with  . Dental Pain   The history is provided by the patient. No language interpreter was used.  HPI Comments: Valerie Vazquez is a 60 y.o. female who presents to the Emergency Department complaining of constant, gradually worsening, dull left lower dental pain onset 3 weeks. Pt states that she has 3 teeth that are broken and crumbling and notes pain to her right lower and upper teeth that began in the past week. She reports bilateral ear pain. She has taken OTC pain medication, brushed her teeth vigorously, and used salt water gargles without relief. 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 Denies fever, difficulty swallowing.  Past Medical History  Diagnosis Date  . Spinal stenosis   . Hypertension   . Pulmonary embolism ([place]) 04/2001; 09/19/2014    "after gallbladder OR; "  . High cholesterol   . Myocardial infarction ([place]) 10/2010 X 3    "while hospitalized"  . Pneumonia 04/2014  . Sleep apnea     "mild" (09/19/2014)  . Type II diabetes mellitus ([place])     "dx'd in 2007; lost weight; no RX for ~ 4 yr now" (09/19/2014)  . Anemia   . History of blood transfusion 1985    related to hysterectomy  . Esophageal spasm   . Gastritis   . Gastroenteritis   . IBS (irritable bowel syndrome)   . GERD (gastroesophageal reflux disease)   . Headache     "couple /month lately" (09/19/2014)  . Migraine     "a few times/yr" (09/19/2014)  . Osteoarthritis     "qwhere; mostly around my joints" (09/19/2014)  . Spondylosis   .  Chronic back pain     "upper and lower" (09/19/2014)  . Depression     "major" (09/19/2014)  . PTSD (post-traumatic stress disorder)   . Schizoaffective disorder ([place])   . Snoring     a. sleep study 5/16: No OSA  . High blood pressure   . Bilateral renal cysts   . Gallstones   . DVT (deep venous thrombosis) [facility])    Past Surgical History  Procedure Laterality Date  . Excision/release bursa hip Right 12/1984  . Total hip arthroplasty Left 04-06-2014  . Tonsillectomy and adenoidectomy  ~ 1968  . Appendectomy  1985  . Abdominal hysterectomy  1985  . Cholecystectomy  2002  . Hernia repair  1985  . Umbilical hernia repair  1985  . Vena cava filter placement  03/2014  . Back surgery    . Anterior cervical decomp/discectomy fusion  10/2012  . Breast cyst excision Right   . Cardiac catheterization   2000's; 2009  . Left heart catheterization with coronary angiogram N/A 12/09/2014    Procedure: LEFT HEART CATHETERIZATION WITH CORONARY ANGIOGRAM;  Surgeon: Burnell Blanks, MD;  [facility] C[facility] LAB;  Service: Cardiovascular;  Laterality: N/A;   Family History  Problem Relation Age of Onset  . Heart attack Neg Hx   . Stroke Mother     Deceased  . Lung cancer  Father     Deceased  . Healthy Daughter   . Healthy Son     x 2  . Other Son     Suicide   Social History  Substance Use Topics  . Smoking status: Former Smoker -- 1.50 packs/day for 10 years    Types: Cigarettes    Quit date: 04/12/1979  . Smokeless tobacco: Never Used  . Alcohol Use: No   OB History    No data available     Review of Systems  Constitutional: Negative for fever.  HENT: Positive for dental problem and ear pain. Negative for trouble swallowing.    Allergies  Abilify; Augmentin; Bactrim; Ceftin; Coconut oil; Diclofenac; Indocin; Linaclotide; Lisinopril; Morphine; Morphine and related; Simvastatin; Sulfa antibiotics; Sulfamethoxazole-trimethoprim; Wellbutrin; and Quetiapine fumarate  Home  Medications   Prior to Admission medications   Medication Sig Start Date End Date Taking? Authorizing Provider  acetaminophen-codeine (TYLENOL #3) 300-30 MG tablet Take 1-2 tablets by mouth every 6 (six) hours as needed for moderate pain. 01/18/16   Domenic Moras, PA-C  apixaban (ELIQUIS) 5 MG TABS tablet Take 1 tablet (5 mg total) by mouth 2 (two) times daily. 11/16/15   [name], MD  BIOTIN PO Take 1 tablet by mouth daily.    Historical Provider, MD  calcium carbonate (TUMS - DOSED IN MG ELEMENTAL CALCIUM) 500 MG chewable tablet Chew 1 tablet by mouth daily as needed for indigestion or heartburn.    Historical Provider, MD  carvedilol (COREG) 3.125 MG tablet Take 1 tablet (3.125 mg total) by mouth 2 (two) times daily with a meal. 10/22/15   Burnell Blanks, MD  cyclobenzaprine (FLEXERIL) 10 MG tablet Take 1 tablet (10 mg total) by mouth 3 (three) times daily as needed for muscle spasms. 08/13/15   [name], MD  dicyclomine (BENTYL) 10 MG capsule TAKE 2 CAPSULES BY MOUTH EVERY SIX HOURS 04/09/15   [name], MD  diltiazem (CARDIZEM CD) 120 MG 24 hr capsule Take 1 capsule (120 mg total) by mouth daily. 09/28/15   [name], MD  diphenhydrAMINE (BENADRYL) 25 MG tablet Take 1-2 tablets (25-50 mg total) by mouth every 6 (six) hours as needed for itching (Rash). 07/14/15   Antonietta Breach, PA-C  EPINEPHrine 0.3 mg/0.3 mL IJ SOAJ injection Inject 0.3 mLs (0.3 mg total) into the muscle once. 10/12/15   [name], MD  escitalopram (LEXAPRO) 10 MG tablet TAKE 1 TABLET BY MOUTH EVERY DAY 10/01/15   [name], MD  famotidine (PEPCID) 20 MG tablet Take 1 tablet (20 mg total) by mouth 2 (two) times daily. Take for itching 07/14/15   Antonietta Breach, PA-C  furosemide (LASIX) 20 MG tablet TAKE 1 TABLET BY MOUTH EVERY DAY 08/14/15   [name], MD  gabapentin (NEURONTIN) 600 MG tablet Take 1 tablet (600 mg total) by mouth every 8 (eight) hours. 04/24/15   [name], MD   hydrOXYzine (ATARAX/VISTARIL) 25 MG tablet Take 1 tablet (25 mg total) by mouth every 6 (six) hours as needed for itching. 07/14/15   Antonietta Breach, PA-C  isosorbide mononitrate (IMDUR) 30 MG 24 hr tablet Take 1 tablet (30 mg total) by mouth daily. 03/22/15   Burnell Blanks, MD  Multiple Vitamin (MULTIVITAMIN WITH MINERALS) TABS tablet Take 2 tablets by mouth daily.     Historical Provider, MD  nitroGLYCERIN (NITROSTAT) 0.4 MG SL tablet Place 1 tablet (0.4 mg total) under the tongue every 5 (five) minutes as needed for chest pain. 01/09/16  [name], MD  ondansetron (ZOFRAN) 4 MG tablet Take 1 tablet (4 mg total) by mouth every 8 (eight) hours as needed for nausea or vomiting. 08/15/15   [name], MD  oxyCODONE-acetaminophen (PERCOCET/ROXICET) 5-325 MG tablet Take 1 tablet by mouth 2 (two) times daily as needed for severe pain. 11/19/15   [name], MD  pantoprazole (PROTONIX) 40 MG tablet TAKE ONE TABLET BY MOUTH EVERY DAY 09/03/15   [name], MD  penicillin v potassium (VEETID) 500 MG tablet Take 1 tablet (500 mg total) by mouth 3 (three) times daily. 01/18/16   Domenic Moras, PA-C  polyethylene glycol powder (GLYCOLAX/MIRALAX) powder Take 17 g by mouth 2 (two) times daily as needed. 10/11/15   [name], MD  potassium chloride (K-DUR) 10 MEQ tablet Take 1 tablet (10 mEq total) by mouth 2 (two) times daily. 10/22/15   Burnell Blanks, MD  traZODone (DESYREL) 50 MG tablet Take 1 tablet (50 mg total) by mouth at bedtime. Patient taking differently: Take 50 mg by mouth at bedtime as needed for sleep.  04/24/15   [name], MD  triamcinolone (NASACORT AQ) 55 MCG/ACT AERO nasal inhaler Place 2 sprays into the nose daily. 10/02/15   Wilhelmina Mcardle, MD   BP 146/75 mmHg  Pulse 92  Temp(Src) 98.4 F (36.9 C) (Oral)  Resp 20  Wt 205 lb (92.987 kg)  SpO2 96% Physical Exam  Constitutional: She is oriented to person, place, and time. She appears  well-developed and well-nourished. No distress.  HENT:  Head: Normocephalic and atraumatic.  Right Ear: External ear normal.  Left Ear: External ear normal.  Mouth/Throat: No trismus in the jaw.  Dental decay noted to tooth numbers 7, 12, 18 with TTP. Mild swelling noted to left sublingual region of the tongue without trismus. Ears are normal.  Eyes: Conjunctivae are normal.  Neck: Normal range of motion.  Cardiovascular: Normal rate.   Pulmonary/Chest: Effort normal. No respiratory distress.  Musculoskeletal: Normal range of motion.  Neurological: She is alert and oriented to person, place, and time.  Skin: Skin is warm and dry.  Psychiatric: She has a normal mood and affect. Her behavior is normal.  Nursing note and vitals reviewed.   ED Course  Procedures   DIAGNOSTIC STUDIES: Oxygen Saturation is 96% on RA, adequate by my interpretation.    COORDINATION OF CARE: 4:34 PM acute on chronic dental pain.  No obvious dental abscess.  No airway compromise.  Will prescribe Veetid and Tylenol #3. Will provide pt with referral to a dentist. Advised pt to follow-up with dentist. Discussed treatment plan with pt at bedside and pt agreed to plan.   MDM   Final diagnoses:  Pain, dental    BP 146/75 mmHg  Pulse 92  Temp(Src) 98.4 F (36.9 C) (Oral)  Resp 20  Wt 92.987 kg  SpO2 96%   I personally performed the services described in this documentation, which was scribed in my presence. The recorded information has been reviewed and is accurate.      Domenic Moras, PA-C 01/18/16 [place], MD 01/18/16 [phone]

## 2016-01-18 NOTE — Discharge Instructions (Signed)
Dental Pain °Dental pain may be caused by many things, including: °· Tooth decay (cavities or caries). Cavities expose the nerve of your tooth to air and hot or cold temperatures. This can cause pain or discomfort. °· Abscess or infection. A dental abscess is a collection of infected pus from a bacterial infection in the inner part of the tooth (pulp). It usually occurs at the end of the tooth's root. °· Injury. °· An unknown reason (idiopathic). °Your pain may be mild or severe. It may only occur when: °· You are chewing. °· You are exposed to hot or cold temperature. °· You are eating or drinking sugary foods or beverages, such as soda or candy. °Your pain may also be constant. °HOME CARE INSTRUCTIONS °Watch your dental pain for any changes. The following actions may help to lessen any discomfort that you are feeling: °· Take medicines only as directed by your dentist. °· If you were prescribed an antibiotic medicine, finish all of it even if you start to feel better. °· Keep all follow-up visits as directed by your dentist. This is important. °· Do not apply heat to the outside of your face. °· Rinse your mouth or gargle with salt water if directed by your dentist. This helps with pain and swelling. °¨ You can make salt water by adding ¼ tsp of salt to 1 cup of warm water. °· Apply ice to the painful area of your face: °¨ Put ice in a plastic bag. °¨ Place a towel between your skin and the bag. °¨ Leave the ice on for 20 minutes, 2-3 times per day. °· Avoid foods or drinks that cause you pain, such as: °¨ Very hot or very cold foods or drinks. °¨ Sweet or sugary foods or drinks. °SEEK MEDICAL CARE IF: °· Your pain is not controlled with medicines. °· Your symptoms are worse. °· You have new symptoms. °SEEK IMMEDIATE MEDICAL CARE IF: °· You are unable to open your mouth. °· You are having trouble breathing or swallowing. °· You have a fever. °· Your face, neck, or jaw is swollen. °  °This information is not  intended to replace advice given to you by your health care provider. Make sure you discuss any questions you have with your health care provider. °  °Document Released: 08/18/2005 Document Revised: 01/02/2015 Document Reviewed: 08/14/2014 °Elsevier Interactive Patient Education ©2016 Elsevier Inc. ° °Community Resource Guide Dental °The United Way’s “211” is a great source of information about community services available.  Access by dialing 2-1-1 from anywhere in [place], or by website -  www.nc211.org.  ° °Other Local Resources (Updated 09/2015) ° °Dental  Care °  °Services ° °  °Phone Number and Address  °Cost  °[facility] For children 0 - 21 years of age:  °• Cleaning °• Tooth brushing/flossing instruction °• Sealants, fillings, crowns °• Extractions °• Emergency treatment  [phone] °[address] °[place], [place] [address] Charges based on family income.  Medicaid and some insurance plans accepted.   °  °[facility] [place] • Cleaning °• Sealants, fillings, crowns °• Extractions °• Emergency treatment [phone] °[address] °[place], [place] ° Pregnant women 18 years of age or older with a Medicaid card  °[facility] [place] • Cleaning °• Sealants, fillings, crowns °• Extractions °• Emergency treatment [phone] °[address] °[place], [place] Pregnant women 18 years of age or older with a   Medicaid card  °[facility] For children 0 - 21 years of age:  °• Cleaning °• Tooth brushing/flossing instruction °• Sealants, fillings, crowns °• Extractions °• Emergency treatment °Limited orthodontic services for patients with Medicaid [phone] °[address] °[place], [place] [address] Medicaid and [place] Health Choice cover for children up to age 21 and pregnant women.  Parents of children up to age 21 without Medicaid pay a  reduced fee at time of service.  °[facility] [place] For children 0 - 21 years of age:  °• Cleaning °• Tooth brushing/flossing instruction °• Sealants, fillings, crowns °• Extractions °• Emergency treatment °Limited orthodontic services for patients with Medicaid [phone] °[address] °[place], [place].  Medicaid and [place] Health Choice cover for children up to age 21 and pregnant women.  Parents of children up to age 21 without Medicaid pay a reduced fee.  °[facility] • Cleaning °• Sealants, fillings, crowns °• Extractions ° °Hours: Tuesdays and Thursdays, 4:15 - 8 pm [phone] °[address] °[place], [place] [address] Services free of charge to [place] residents ages 18-64 who do not have health insurance, Medicare, Medicaid, or VA benefits and fall within federal poverty guidelines  °[facility] ° ° ° Provides dental care in addition to primary medical care, nutritional counseling, and pharmacy: °• Cleaning °• Sealants, fillings, crowns °• Extractions ° ° ° ° ° ° ° ° ° ° ° ° ° ° ° ° ° [phone] °[facility], [address] °[place], [place] ° °[phone] °[facility], [address] [place], [place] ° °[phone] °[facility] °[place], [place] ° °[phone] °[facility], [address] °[place], [place] ° °[phone] °[facility] °[address] °[place], [place] Accepts Medicaid, Medicare, most insurance.  Also provides services available to all with fees adjusted based on ability to pay.    °[facility] • Cleaning °• Tooth brushing/flossing instruction °• Sealants, fillings, crowns °• Extractions °• Emergency treatment °Hours: Tuesdays, Thursdays, and Fridays from 8 am to 5 pm by appointment only. [phone] °[address] °[place], [place]  [address] [place] residents with Medicaid (depending on eligibility) and children with [place] Health Choice - call for more information.  °[facility] • Extractions only ° °Hours: 2nd and 4th Thursday of each month from 6:30 am - 9 am.   [phone] ext. [phone] °[address] °[place], [place] [address] Ages 18 and older only.  Patients are seen on a first come, first served basis.  °[facility] • Cleanings °• Fillings °• Extractions °• Orthodontics °• Endodontics °• Implants/Crowns/Bridges °• Complete and partial dentures [phone] °[place],  Patients must complete an application for services.  There is often a waiting list.   ° °

## 2016-01-22 ENCOUNTER — Ambulatory Visit
Admission: RE | Admit: 2016-01-22 | Discharge: 2016-01-22 | Disposition: A | Discharge status: Home / Self Care | Payer: Medicare Other | Source: Ambulatory Visit | Attending: Family | Admitting: Family

## 2016-01-22 ENCOUNTER — Encounter: Payer: Self-pay | Admitting: Family

## 2016-01-22 ENCOUNTER — Ambulatory Visit (INDEPENDENT_AMBULATORY_CARE_PROVIDER_SITE_OTHER): Payer: Medicare Other | Admitting: Family

## 2016-01-22 ENCOUNTER — Telehealth: Payer: Self-pay

## 2016-01-22 VITALS — BP 120/78 | HR 84 | Temp 98.7°F | Ht 66.0 in | Wt 210.8 lb

## 2016-01-22 DIAGNOSIS — I201 Angina pectoris with documented spasm: Secondary | ICD-10-CM | POA: Diagnosis not present

## 2016-01-22 DIAGNOSIS — M25512 Pain in left shoulder: Secondary | ICD-10-CM

## 2016-01-22 DIAGNOSIS — M19012 Primary osteoarthritis, left shoulder: Secondary | ICD-10-CM | POA: Diagnosis not present

## 2016-01-22 MED ORDER — DICLOFENAC SODIUM 1 % TD GEL: 4.0000 g | Freq: Four times a day (QID) | TRANSDERMAL | Status: DC

## 2016-01-22 NOTE — Telephone Encounter (Signed)
Spoke to patient. Gave results and instructions. Patient verbalized understanding. Says she had trouble getting the Voltaren gel filled. I called pharmacy. Med needs [facility]auth[facility]

## 2016-01-22 NOTE — Telephone Encounter (Signed)
-----   Message from Burnard Hawthorne, [place] sent at 01/22/2016  4:21 PM EDT ----- Please let patient know that recent tests were ABNORMAL with the following advice.  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 Trial of voltaren gel which was prescribed at visit.    If unable to reach patient after 3 attempts, please let me know.   Thanks as always!  margaret

## 2016-01-22 NOTE — Progress Notes (Signed)
Pre visit review using our clinic review tool, if applicable. No additional management support is needed unless otherwise documented below in the visit note. 

## 2016-01-22 NOTE — Patient Instructions (Signed)
Trial of voltaren gel.   Shoulder x-ray at [facility].  Sci Fason gel over-the-counter for shoulder pain. Alternating heat and ice.  If there is no improvement in your symptoms, or if there is any worsening of symptoms, or if you have any additional concerns, please return for re-evaluation; or, if we are closed, consider going to the Emergency Room for evaluation if symptoms urgent.   Shoulder Pain The shoulder is the joint that connects your arms to your body. The bones that form the shoulder joint include the upper arm bone (humerus), the shoulder blade (scapula), and the collarbone (clavicle). The top of the humerus is shaped like a ball and fits into a rather flat socket on the scapula (glenoid cavity). A combination of muscles and strong, fibrous tissues that connect muscles to bones (tendons) support your shoulder joint and hold the ball in the socket. Small, fluid-filled sacs (bursae) are located in different areas of the joint. They act as cushions between the bones and the overlying soft tissues and help reduce friction between the gliding tendons and the bone as you move your arm. Your shoulder joint allows a wide range of motion in your arm. This range of motion allows you to do things like scratch your back or throw a ball. However, this range of motion also makes your shoulder more prone to pain from overuse and injury. Causes of shoulder pain can originate from both injury and overuse and usually can be grouped in the following four categories:  Redness, swelling, and pain (inflammation) of the tendon (tendinitis) or the bursae (bursitis).  Instability, such as a dislocation of the joint.  Inflammation of the joint (arthritis).  Broken bone (fracture). HOME CARE INSTRUCTIONS   Apply ice to the sore area.  Put ice in a plastic bag.  Place a towel between your skin and the bag.  Leave the ice on for 15-20 minutes, 3-4 times per day for the first 2 days, or as directed by  your health care provider.  Stop using cold packs if they do not help with the pain.  If you have a shoulder sling or immobilizer, wear it as long as your caregiver instructs. Only remove it to shower or bathe. Move your arm as little as possible, but keep your hand moving to prevent swelling.  Squeeze a soft ball or foam pad as much as possible to help prevent swelling.  Only take over-the-counter or prescription medicines for pain, discomfort, or fever as directed by your caregiver. SEEK MEDICAL CARE IF:   Your shoulder pain increases, or new pain develops in your arm, hand, or fingers.  Your hand or fingers become cold and numb.  Your pain is not relieved with medicines. SEEK IMMEDIATE MEDICAL CARE IF:   Your arm, hand, or fingers are numb or tingling.  Your arm, hand, or fingers are significantly swollen or turn white or blue. MAKE SURE YOU:   Understand these instructions.  Will watch your condition.  Will get help right away if you are not doing well or get worse.   This information is not intended to replace advice given to you by your health care provider. Make sure you discuss any questions you have with your health care provider.   Document Released: 05/28/2005 Document Revised: 09/08/2014 Document Reviewed: 12/11/2014 Elsevier Interactive Patient Education [employer].
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
## 2016-01-23 ENCOUNTER — Inpatient Hospital Stay: Payer: Medicare Other

## 2016-01-23 ENCOUNTER — Inpatient Hospital Stay: Payer: Medicare Other | Attending: Oncology | Admitting: Oncology

## 2016-01-23 VITALS — BP 134/82 | HR 73 | Temp 97.1°F | Resp 18 | Ht 67.32 in | Wt 208.8 lb

## 2016-01-23 DIAGNOSIS — K589 Irritable bowel syndrome without diarrhea: Secondary | ICD-10-CM | POA: Diagnosis not present

## 2016-01-23 DIAGNOSIS — Z7901 Long term (current) use of anticoagulants: Secondary | ICD-10-CM | POA: Diagnosis not present

## 2016-01-23 DIAGNOSIS — I10 Essential (primary) hypertension: Secondary | ICD-10-CM | POA: Insufficient documentation

## 2016-01-23 DIAGNOSIS — Z86718 Personal history of other venous thrombosis and embolism: Secondary | ICD-10-CM

## 2016-01-23 DIAGNOSIS — G4730 Sleep apnea, unspecified: Secondary | ICD-10-CM | POA: Diagnosis not present

## 2016-01-23 DIAGNOSIS — Z79899 Other long term (current) drug therapy: Secondary | ICD-10-CM | POA: Diagnosis not present

## 2016-01-23 DIAGNOSIS — Z87891 Personal history of nicotine dependence: Secondary | ICD-10-CM | POA: Insufficient documentation

## 2016-01-23 DIAGNOSIS — E119 Type 2 diabetes mellitus without complications: Secondary | ICD-10-CM | POA: Insufficient documentation

## 2016-01-23 DIAGNOSIS — F259 Schizoaffective disorder, unspecified: Secondary | ICD-10-CM | POA: Insufficient documentation

## 2016-01-23 DIAGNOSIS — M25519 Pain in unspecified shoulder: Secondary | ICD-10-CM | POA: Insufficient documentation

## 2016-01-23 DIAGNOSIS — I252 Old myocardial infarction: Secondary | ICD-10-CM | POA: Insufficient documentation

## 2016-01-23 DIAGNOSIS — R51 Headache: Secondary | ICD-10-CM | POA: Insufficient documentation

## 2016-01-23 DIAGNOSIS — E7800 Pure hypercholesterolemia, unspecified: Secondary | ICD-10-CM | POA: Diagnosis not present

## 2016-01-23 DIAGNOSIS — Z86711 Personal history of pulmonary embolism: Secondary | ICD-10-CM | POA: Insufficient documentation

## 2016-01-23 DIAGNOSIS — G8929 Other chronic pain: Secondary | ICD-10-CM | POA: Insufficient documentation

## 2016-01-23 DIAGNOSIS — K219 Gastro-esophageal reflux disease without esophagitis: Secondary | ICD-10-CM | POA: Insufficient documentation

## 2016-01-23 DIAGNOSIS — M1990 Unspecified osteoarthritis, unspecified site: Secondary | ICD-10-CM | POA: Insufficient documentation

## 2016-01-23 LAB — ANTITHROMBIN III: AntiThromb III Func: 123 % — ABNORMAL HIGH (ref 75–120)

## 2016-01-23 NOTE — Progress Notes (Signed)
Patient has history of DVT and PE is now on Eliquis.  The last episode was PE in 09/2014 and that is when she was started on Eliquis.

## 2016-01-24 ENCOUNTER — Telehealth: Payer: Self-pay | Admitting: Family Medicine

## 2016-01-24 DIAGNOSIS — G894 Chronic pain syndrome: Secondary | ICD-10-CM

## 2016-01-24 MED ORDER — GABAPENTIN 600 MG PO TABS: 300.0000 mg | ORAL_TABLET | Freq: Three times a day (TID) | ORAL | Status: DC

## 2016-01-24 NOTE — Telephone Encounter (Signed)
The diclofenac was sent to her pharmacy. She should try this first. Her x-ray revealed arthritic changes in her before meals joint.

## 2016-01-24 NOTE — Telephone Encounter (Signed)
See other phone note

## 2016-01-24 NOTE — Telephone Encounter (Signed)
Pt wants to know if there is any pain medicine she can get to help with the shoulder pain.. She also wants to know what the meaning of an ABNORMAL Xray is.. Please advise pt.. Also she was checking on the status of her Voltaren.Marland Kitchen

## 2016-01-24 NOTE — Telephone Encounter (Signed)
Caller name: Josina Macdonnell Relationship to patient: patient Can be reached: [phone]  Reason for call: pt called to request medication for her shoulder pain. Patient request for pain medication and Gabapentin.  [facility] [address]

## 2016-01-24 NOTE — Telephone Encounter (Signed)
Gabapentin sent to pharmacy

## 2016-01-24 NOTE — Telephone Encounter (Signed)
Patient has tried and gotten relief with gabapentin.  Patient has not yet tried the Voltaren because it needs a prior auth from her insurance.

## 2016-01-24 NOTE — Telephone Encounter (Signed)
Please confirm whether or not the patient has been taking gabapentin recently. Please see if the patient has tried the Voltaren gel that she was prescribed earlier this week. Given her allergies to pain medications there are not very many options for her pain.
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
## 2016-01-29 ENCOUNTER — Ambulatory Visit: Payer: Medicare Other | Attending: Pain Medicine | Admitting: Pain Medicine

## 2016-01-29 ENCOUNTER — Encounter: Payer: Self-pay | Admitting: Pain Medicine

## 2016-01-29 ENCOUNTER — Telehealth: Payer: Self-pay

## 2016-01-29 VITALS — BP 159/94 | HR 75 | Temp 98.1°F | Resp 16 | Ht 66.0 in | Wt 210.0 lb

## 2016-01-29 DIAGNOSIS — G4700 Insomnia, unspecified: Secondary | ICD-10-CM | POA: Diagnosis not present

## 2016-01-29 DIAGNOSIS — I10 Essential (primary) hypertension: Secondary | ICD-10-CM | POA: Insufficient documentation

## 2016-01-29 DIAGNOSIS — M25559 Pain in unspecified hip: Secondary | ICD-10-CM | POA: Diagnosis not present

## 2016-01-29 DIAGNOSIS — M4806 Spinal stenosis, lumbar region: Secondary | ICD-10-CM | POA: Diagnosis not present

## 2016-01-29 DIAGNOSIS — R109 Unspecified abdominal pain: Secondary | ICD-10-CM | POA: Insufficient documentation

## 2016-01-29 DIAGNOSIS — F418 Other specified anxiety disorders: Secondary | ICD-10-CM | POA: Diagnosis not present

## 2016-01-29 DIAGNOSIS — M7540 Impingement syndrome of unspecified shoulder: Secondary | ICD-10-CM | POA: Insufficient documentation

## 2016-01-29 DIAGNOSIS — R110 Nausea: Secondary | ICD-10-CM | POA: Insufficient documentation

## 2016-01-29 DIAGNOSIS — R209 Unspecified disturbances of skin sensation: Secondary | ICD-10-CM

## 2016-01-29 DIAGNOSIS — Z96642 Presence of left artificial hip joint: Secondary | ICD-10-CM | POA: Insufficient documentation

## 2016-01-29 DIAGNOSIS — D649 Anemia, unspecified: Secondary | ICD-10-CM | POA: Diagnosis not present

## 2016-01-29 DIAGNOSIS — Z86718 Personal history of other venous thrombosis and embolism: Secondary | ICD-10-CM | POA: Diagnosis not present

## 2016-01-29 DIAGNOSIS — G43909 Migraine, unspecified, not intractable, without status migrainosus: Secondary | ICD-10-CM | POA: Insufficient documentation

## 2016-01-29 DIAGNOSIS — J90 Pleural effusion, not elsewhere classified: Secondary | ICD-10-CM | POA: Insufficient documentation

## 2016-01-29 DIAGNOSIS — M4696 Unspecified inflammatory spondylopathy, lumbar region: Secondary | ICD-10-CM | POA: Diagnosis not present

## 2016-01-29 DIAGNOSIS — R0789 Other chest pain: Secondary | ICD-10-CM

## 2016-01-29 DIAGNOSIS — Z79891 Long term (current) use of opiate analgesic: Secondary | ICD-10-CM

## 2016-01-29 DIAGNOSIS — M25531 Pain in right wrist: Secondary | ICD-10-CM | POA: Diagnosis not present

## 2016-01-29 DIAGNOSIS — E7800 Pure hypercholesterolemia, unspecified: Secondary | ICD-10-CM | POA: Insufficient documentation

## 2016-01-29 DIAGNOSIS — E669 Obesity, unspecified: Secondary | ICD-10-CM | POA: Diagnosis not present

## 2016-01-29 DIAGNOSIS — K5900 Constipation, unspecified: Secondary | ICD-10-CM | POA: Diagnosis not present

## 2016-01-29 DIAGNOSIS — M545 Other chronic pain: Secondary | ICD-10-CM | POA: Insufficient documentation

## 2016-01-29 DIAGNOSIS — M25552 Pain in left hip: Secondary | ICD-10-CM | POA: Diagnosis not present

## 2016-01-29 DIAGNOSIS — M5126 Other intervertebral disc displacement, lumbar region: Secondary | ICD-10-CM | POA: Diagnosis not present

## 2016-01-29 DIAGNOSIS — G8929 Other chronic pain: Secondary | ICD-10-CM | POA: Insufficient documentation

## 2016-01-29 DIAGNOSIS — K034 Hypercementosis: Secondary | ICD-10-CM | POA: Diagnosis not present

## 2016-01-29 DIAGNOSIS — Z5181 Encounter for therapeutic drug level monitoring: Secondary | ICD-10-CM

## 2016-01-29 DIAGNOSIS — M542 Cervicalgia: Secondary | ICD-10-CM

## 2016-01-29 DIAGNOSIS — Z87891 Personal history of nicotine dependence: Secondary | ICD-10-CM | POA: Insufficient documentation

## 2016-01-29 DIAGNOSIS — M25462 Effusion, left knee: Secondary | ICD-10-CM | POA: Diagnosis not present

## 2016-01-29 DIAGNOSIS — R202 Paresthesia of skin: Secondary | ICD-10-CM | POA: Diagnosis not present

## 2016-01-29 DIAGNOSIS — M791 Myalgia: Secondary | ICD-10-CM

## 2016-01-29 DIAGNOSIS — M160 Bilateral primary osteoarthritis of hip: Secondary | ICD-10-CM | POA: Diagnosis not present

## 2016-01-29 DIAGNOSIS — F4310 Post-traumatic stress disorder, unspecified: Secondary | ICD-10-CM | POA: Diagnosis not present

## 2016-01-29 DIAGNOSIS — M65829 Other synovitis and tenosynovitis, unspecified upper arm: Secondary | ICD-10-CM | POA: Insufficient documentation

## 2016-01-29 DIAGNOSIS — E119 Type 2 diabetes mellitus without complications: Secondary | ICD-10-CM | POA: Diagnosis not present

## 2016-01-29 DIAGNOSIS — K589 Irritable bowel syndrome without diarrhea: Secondary | ICD-10-CM | POA: Insufficient documentation

## 2016-01-29 DIAGNOSIS — Z86711 Personal history of pulmonary embolism: Secondary | ICD-10-CM | POA: Diagnosis not present

## 2016-01-29 DIAGNOSIS — G894 Chronic pain syndrome: Secondary | ICD-10-CM | POA: Insufficient documentation

## 2016-01-29 DIAGNOSIS — I252 Old myocardial infarction: Secondary | ICD-10-CM | POA: Diagnosis not present

## 2016-01-29 DIAGNOSIS — N644 Mastodynia: Secondary | ICD-10-CM | POA: Diagnosis not present

## 2016-01-29 DIAGNOSIS — R51 Headache: Secondary | ICD-10-CM | POA: Insufficient documentation

## 2016-01-29 DIAGNOSIS — M25512 Pain in left shoulder: Secondary | ICD-10-CM | POA: Diagnosis not present

## 2016-01-29 DIAGNOSIS — M7918 Myalgia, other site: Secondary | ICD-10-CM

## 2016-01-29 DIAGNOSIS — M48061 Spinal stenosis, lumbar region without neurogenic claudication: Secondary | ICD-10-CM | POA: Insufficient documentation

## 2016-01-29 DIAGNOSIS — R220 Localized swelling, mass and lump, head: Secondary | ICD-10-CM | POA: Insufficient documentation

## 2016-01-29 DIAGNOSIS — M1611 Unilateral primary osteoarthritis, right hip: Secondary | ICD-10-CM | POA: Diagnosis not present

## 2016-01-29 DIAGNOSIS — K224 Dyskinesia of esophagus: Secondary | ICD-10-CM | POA: Diagnosis not present

## 2016-01-29 DIAGNOSIS — F419 Anxiety disorder, unspecified: Secondary | ICD-10-CM | POA: Diagnosis not present

## 2016-01-29 DIAGNOSIS — J189 Pneumonia, unspecified organism: Secondary | ICD-10-CM | POA: Insufficient documentation

## 2016-01-29 DIAGNOSIS — G9619 Other disorders of meninges, not elsewhere classified: Secondary | ICD-10-CM | POA: Diagnosis not present

## 2016-01-29 DIAGNOSIS — Z9889 Other specified postprocedural states: Secondary | ICD-10-CM

## 2016-01-29 DIAGNOSIS — Z79899 Other long term (current) drug therapy: Secondary | ICD-10-CM

## 2016-01-29 DIAGNOSIS — K219 Gastro-esophageal reflux disease without esophagitis: Secondary | ICD-10-CM | POA: Insufficient documentation

## 2016-01-29 DIAGNOSIS — M792 Neuralgia and neuritis, unspecified: Secondary | ICD-10-CM

## 2016-01-29 DIAGNOSIS — I201 Angina pectoris with documented spasm: Secondary | ICD-10-CM | POA: Diagnosis not present

## 2016-01-29 DIAGNOSIS — F1190 Opioid use, unspecified, uncomplicated: Secondary | ICD-10-CM

## 2016-01-29 DIAGNOSIS — M25519 Pain in unspecified shoulder: Secondary | ICD-10-CM | POA: Diagnosis present

## 2016-01-29 DIAGNOSIS — Z0189 Encounter for other specified special examinations: Secondary | ICD-10-CM

## 2016-01-29 DIAGNOSIS — M47816 Spondylosis without myelopathy or radiculopathy, lumbar region: Secondary | ICD-10-CM

## 2016-01-29 DIAGNOSIS — M549 Dorsalgia, unspecified: Secondary | ICD-10-CM | POA: Diagnosis present

## 2016-01-29 DIAGNOSIS — M1612 Unilateral primary osteoarthritis, left hip: Secondary | ICD-10-CM

## 2016-01-29 HISTORY — DX: Other chronic pain: G89.29

## 2016-01-29 HISTORY — DX: Spinal stenosis, lumbar region without neurogenic claudication: M48.061

## 2016-01-29 HISTORY — DX: Other specified postprocedural states: Z98.890

## 2016-01-29 HISTORY — DX: Cervicalgia: M54.2

## 2016-01-29 NOTE — Telephone Encounter (Signed)
PA for Diclofenac sodium 1% gel.

## 2016-01-29 NOTE — Patient Instructions (Signed)

## 2016-01-29 NOTE — Progress Notes (Signed)
Currently taking PCN for tooth abscess.

## 2016-01-29 NOTE — Progress Notes (Signed)
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 Decreased ROM Stability: No instability detected Muscle strength & Tone: Functionally intact Sensory: Unimpaired Palpation: Tender Provocative Tests: Lumbar Hyperextension and rotation test: Positive for bilateral Lumbar facet pain. Patrick's Maneuver: deferred  Gait & Posture Assessment  Ambulation: Unassisted Gait: Antalgic Posture: Antalgic  Lower Extremity Exam    Side: Right lower extremity  Side: Left lower extremity  Inspection: No masses, redness, swelling, or asymmetry ROM:  Inspection: No masses, redness, swelling, or asymmetry. Evidence of prior left hip replacement. ROM:  Functional: Adequate ROM  Functional: Adequate ROM  Active: Unrestricted ROM  Active: Limited ROM  Muscle strength & Tone: Functionally intact  Muscle strength & Tone: Functionally intact  Sensory: Unimpaired   Sensory: Unimpaired  Palpation: Non-contributory  Palpation: Non-contributory   Assessment  Primary Diagnosis & Pertinent Problem List: The primary encounter diagnosis was Chronic pain. 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  Visit Diagnosis: 1. Chronic pain   2. Neurogenic pain   3. Musculoskeletal pain   4. Long term current use of opiate analgesic   5. Long term prescription opiate use   6. Opiate use   7. Encounter for therapeutic drug level monitoring   8. Encounter for pain management planning   9. Chronic neck pain   10. Chronic chest wall pain   11. Chronic low back pain   12. Disturbance of skin sensation   13. Chronic shoulder pain (Left)   14. Chronic hip pain, unspecified laterality   15. Primary osteoarthritis of left hip   16. Primary osteoarthritis of both hips   17. Lumbar facet arthropathy   18. Lumbar central spinal stenosis (moderate at L4-5; mild at L2-3 and L3-4)   19. 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 Lumbar facet syndrome   21. Hx of cervical spine surgery     Assessment: No problem-specific assessment & plan notes found for this encounter.   Plan of Care  Initial Treatment Plan:  Please be advised that as per protocol, today's visit has been an evaluation only. We have not taken over the  patient's  controlled substance management.  Problem List Items Addressed This Visit      High   Chronic chest wall pain (Chronic)   Chronic hip pain (Location of Tertiary source of pain) (Bilateral) (R>L) (Chronic)   Chronic low back pain (Location of Secondary source of pain) (Bilateral) (R>L) (Chronic)   Chronic neck pain (midline) (Chronic)   Chronic pain - Primary (Chronic)   Relevant Orders   Magnesium   C-reactive protein   Sedimentation rate   25-Hydroxyvitamin D Lcms D2+D3   Chronic shoulder pain (Location of Primary Source of Pain) (Left) (Chronic)   Hx of cervical spine surgery   Lumbar central spinal stenosis (moderate at L4-5; mild at L2-3 and L3-4) (Chronic)   Lumbar facet arthropathy (Severe at L2-3, L3-4, L4-5, and L5-S1) (Chronic)   Lumbar facet syndrome (Chronic)   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    Lab-work & Procedure Ordered: Orders Placed This Encounter  Procedures  . Compliance Drug Analysis, Ur  . Magnesium  . C-reactive protein  . Sedimentation rate  . 25-Hydroxyvitamin D Lcms D2+D3    Imaging Ordered: None  Interventional Therapies: Scheduled:  None at this time.    Considering:   1. Diagnostic bilateral lumbar facet block under fluoroscopic guidance and IV sedation with possible follow-up radiofrequency ablation for the low back pain and referred pain  down the legs.  2. Diagnostic intra-articular hip joint injection under fluoroscopic guidance, with or without sedation for the hip pain.  3. Diagnostic left suprascapular nerve block with possible radiofrequency ablation follow-up, for the left shoulder pain.  4. Diagnostic left cervical epidural steroid injection under fluoroscopic guidance, with or without sedation for the neck pain and left shoulder radicular pain component.  5. Diagnostic L4-5 lumbar epidural steroid injection under fluoroscopic guidance, with or without sedation for the low back pain and leg pain.    PRN Procedures:  None at this time.    Referral(s) or Consult(s): Medical psychology consult for substance use disorder evaluation  Medications administered during this visit: Valerie Vazquez had no medications administered during this visit.  Prescriptions ordered during this visit: New Prescriptions   No medications on file    Requested PM Follow-up: Return for Return after MedPsych (SUD) Eval.  Future Appointments Date Time Provider Grant  02/05/2016 8:45 AM Manus Gunning, MD [facility]  02/13/2016 1:30 PM Lloyd Huger, MD [facility] None     Primary Care Physician: [name], MD Location: [facility] Note by: Kathlen Brunswick. Dossie Arbour, M.D, DABA, DABAPM, DABPM, DABIPP, FIPP  Pain Score Disclaimer: We use the NRS-11 scale. This is a self-reported, subjective measurement of pain severity with only modest accuracy. It is used primarily to identify changes within a particular patient. It must be understood that outpatient pain scales are significantly less accurate that those used for research, where they can  be applied under ideal controlled circumstances with minimal exposure to variables. In reality, the score is likely to be a combination of pain intensity and pain affect, where pain affect describes the degree of emotional arousal or changes in action  readiness caused by the sensory experience of pain. Factors such as social and work situation, setting, emotional state, anxiety levels, expectation, and prior pain experience may influence pain perception and show large inter-individual differences that may also be affected by time variables.  Patient instructions provided during this appointment: Patient Instructions  Pain Management Discharge Instructions  General Discharge Instructions :  If you need to reach your doctor call: Monday-Friday 8:00 am - 4:00 pm at [phone] or toll free [phone].  After clinic hours [phone] to have operator reach doctor.  Bring all of your medication bottles to all your appointments in the pain clinic.  To cancel or reschedule your appointment with Pain Management please remember to call 24 hours in advance to avoid a fee.  Refer to the educational materials which you have been given on: General Risks, I had my Procedure. Discharge Instructions, Post Sedation.  Post Procedure Instructions:  The drugs you were given will stay in your system until tomorrow, so for the next 24 hours you should not drive, make any legal decisions or drink any alcoholic beverages.  You may eat anything you prefer, but it is better to start with liquids then soups and crackers, and gradually work up to solid foods.  Please notify your doctor immediately if you have any unusual bleeding, trouble breathing or pain that is not related to your normal pain.  Depending on the type of procedure that was done, some parts of your body may feel week and/or numb.  This usually clears up by tonight or the next day.  Walk with the use of an assistive device or accompanied by an adult for the 24 hours.  You may use ice on the affected area for the first 24 hours.  Put ice in a Ziploc bag and cover with a towel and place against area 15 minutes on 15 minutes off.  You may switch to heat after 24 hours.

## 2016-01-31 ENCOUNTER — Other Ambulatory Visit: Payer: Self-pay

## 2016-01-31 ENCOUNTER — Other Ambulatory Visit
Admission: RE | Admit: 2016-01-31 | Discharge: 2016-01-31 | Disposition: A | Discharge status: Home / Self Care | Payer: Medicare Other | Source: Ambulatory Visit | Attending: Pain Medicine | Admitting: Pain Medicine

## 2016-01-31 ENCOUNTER — Telehealth: Payer: Self-pay | Admitting: *Deleted

## 2016-01-31 DIAGNOSIS — G8929 Other chronic pain: Secondary | ICD-10-CM

## 2016-01-31 LAB — MAGNESIUM: Magnesium: 2.2 mg/dL (ref 1.7–2.4)

## 2016-01-31 LAB — SEDIMENTATION RATE: Sed Rate: 34 mm/hr — ABNORMAL HIGH (ref 0–30)

## 2016-01-31 LAB — C-REACTIVE PROTEIN: CRP: 2.3 mg/dL — ABNORMAL HIGH (ref <1.0)

## 2016-01-31 NOTE — Telephone Encounter (Signed)
It looks like the chest xray results were sent to the float CMA and I don't see them in her basket anymore or that they were called to the patient, can you review and then the PA was denied for the Voltaren, no explanation given for or alternatives given from the insurance.

## 2016-01-31 NOTE — Telephone Encounter (Signed)
Patient stated that she had a abnormal Xray, she did not receive a call to discuss the abnormalities. She requested to have  Call to discuss this. She still has pain in the are. She also wanted to follow up on the prior authorization for Voltaren ( pain cream). Patient stated that she had blood in her urine from her last lab results, she would also like to discuss this issue as well.

## 2016-01-31 NOTE — Telephone Encounter (Signed)
The patient had a shoulder x-ray, not a chest x-ray. The shoulder x-ray revealed arthritis. The patient did not have blood on the urine microscopy which means there is no blood in her urine. Please determine if the patient still has her Percocet and see if she has been taking this for pain. Please also let her know that the Voltaren gel was declined by her insurance.

## 2016-02-01 ENCOUNTER — Telehealth: Payer: Self-pay | Admitting: Family Medicine

## 2016-02-01 LAB — FACTOR 5 LEIDEN

## 2016-02-01 LAB — HOMOCYSTEINE: Homocysteine: 8.2 umol/L (ref 0.0–15.0)

## 2016-02-01 LAB — CARDIOLIPIN ANTIBODIES, IGG, IGM, IGA
Anticardiolipin IgA: <9 APL U/mL (ref 0–11)
Anticardiolipin IgG: <9 GPL U/mL (ref 0–14)
Anticardiolipin IgM: 13 MPL U/mL — ABNORMAL HIGH (ref 0–12)

## 2016-02-01 LAB — DRVVT MIX: dRVVT Mix: 48.4 s — ABNORMAL HIGH (ref 0.0–47.0)

## 2016-02-01 LAB — BETA-2-GLYCOPROTEIN I ABS, IGG/M/A
Beta-2 Glyco I IgG: <9 GPI IgG units (ref 0–20)
Beta-2-Glycoprotein I IgA: <9 GPI IgA units (ref 0–25)
Beta-2-Glycoprotein I IgM: <9 GPI IgM units (ref 0–32)

## 2016-02-01 LAB — PROTHROMBIN GENE MUTATION

## 2016-02-01 LAB — DRVVT CONFIRM: dRVVT Confirm: 1 ratio (ref 0.8–1.2)

## 2016-02-01 LAB — PROTEIN S ACTIVITY: Protein S Activity: 143 % — ABNORMAL HIGH (ref 63–140)

## 2016-02-01 LAB — LUPUS ANTICOAGULANT PANEL
DRVVT: 57.6 s — ABNORMAL HIGH (ref 0.0–47.0)
PTT Lupus Anticoagulant: 28.9 s (ref 0.0–43.6)

## 2016-02-01 LAB — PROTEIN S, TOTAL: Protein S Ag, Total: 132 % (ref 60–150)

## 2016-02-01 LAB — PROTEIN C ACTIVITY: Protein C Activity: >199 % — ABNORMAL HIGH (ref 73–180)

## 2016-02-01 LAB — PROTEIN C, TOTAL: Protein C, Total: 160 % — ABNORMAL HIGH (ref 60–150)

## 2016-02-01 MED ORDER — OXYCODONE-ACETAMINOPHEN 5-325 MG PO TABS: 1.0000 | ORAL_TABLET | Freq: Two times a day (BID) | ORAL | Status: DC | PRN

## 2016-02-01 NOTE — Telephone Encounter (Signed)
Pain mgmt appt next. Per his last note. Thanks.

## 2016-02-01 NOTE — Telephone Encounter (Signed)
I will provide a short course refill until she has her next appointment with pain management.

## 2016-02-01 NOTE — Telephone Encounter (Signed)
Spoke with the patient, reviewed the xray results and that the voltaren was denied.  She doesn't have any percocets and  Due to seeing pain mgmt she is not sure what options she has regarding a prescription from someone else.  She has been in pain with no medications up to this point.  Please advise.

## 2016-02-01 NOTE — Telephone Encounter (Signed)
Notified patient that Rx will be at the front desk to pick up. Also advised her that Dr. Caryl Bis will only do short time until her next pain management appointment

## 2016-02-01 NOTE — Telephone Encounter (Signed)
Attempted to call patient. Left VM.

## 2016-02-01 NOTE — Telephone Encounter (Signed)
Pt picked up her prescription today. She was wondering what the next step will be, see Dr. Caryl Bis or her pain management physician.

## 2016-02-02 ENCOUNTER — Emergency Department (HOSPITAL_COMMUNITY)
Admission: EM | Admit: 2016-02-02 | Discharge: 2016-02-02 | Disposition: A | Discharge status: Home / Self Care | Payer: Medicare Other | Attending: Emergency Medicine | Admitting: Emergency Medicine

## 2016-02-02 ENCOUNTER — Encounter (HOSPITAL_COMMUNITY): Payer: Self-pay

## 2016-02-02 DIAGNOSIS — Z79899 Other long term (current) drug therapy: Secondary | ICD-10-CM | POA: Diagnosis not present

## 2016-02-02 DIAGNOSIS — E119 Type 2 diabetes mellitus without complications: Secondary | ICD-10-CM | POA: Insufficient documentation

## 2016-02-02 DIAGNOSIS — I252 Old myocardial infarction: Secondary | ICD-10-CM | POA: Diagnosis not present

## 2016-02-02 DIAGNOSIS — K0889 Other specified disorders of teeth and supporting structures: Secondary | ICD-10-CM | POA: Insufficient documentation

## 2016-02-02 DIAGNOSIS — Z7901 Long term (current) use of anticoagulants: Secondary | ICD-10-CM | POA: Insufficient documentation

## 2016-02-02 DIAGNOSIS — R220 Localized swelling, mass and lump, head: Secondary | ICD-10-CM | POA: Diagnosis present

## 2016-02-02 DIAGNOSIS — I10 Essential (primary) hypertension: Secondary | ICD-10-CM | POA: Diagnosis not present

## 2016-02-02 DIAGNOSIS — Z87891 Personal history of nicotine dependence: Secondary | ICD-10-CM | POA: Insufficient documentation

## 2016-02-02 MED ORDER — CLINDAMYCIN HCL 150 MG PO CAPS: 450.0000 mg | ORAL_CAPSULE | Freq: Three times a day (TID) | ORAL | Status: DC

## 2016-02-02 NOTE — ED Notes (Addendum)
States has been taking Percocet - helping w/pain. Last dose was PTA. States does not have a Pharmacist, community d/t no insurance.

## 2016-02-02 NOTE — ED Notes (Signed)
Patient here with ongoing oral swelling that she thinks related to ongoing infection. Has been on antibiotics for same. Concerned that her face and ear are tender

## 2016-02-02 NOTE — ED Provider Notes (Signed)
CSN: [id number]     Arrival date & time 02/02/16  1450 History  By signing my name below, I, Valerie Vazquez, attest that this documentation has been prepared under the direction and in the presence of non-physician practitioner, Hyman Bible, PA-C. Electronically Signed: Dora Vazquez, Scribe. 02/02/2016. 4:07 PM.    Chief Complaint  Patient presents with  . Oral Swelling     The history is provided by the patient. No language interpreter was used.     HPI Comments: Valerie Vazquez is a 60 y.o. female who presents to the Emergency Department complaining of constant, left-sided oral pain and swelling for the last month. Pt believes that she has a dental infection; she states that two teeth on the left side of her mouth have started to "crumble" recently. Pt states that she has several cavities. Pt notes that the pain radiates into her left ear. She endorses left sided neck soreness due to her dental pain but states that she is able to move her neck without difficulty. Pt reports that she was seen here on May 13th for the same issue; she was prescribed penicillin with no relief. 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 She has been taking Percocet for her current symptoms. Pt does not have a dentist. She denies fever, difficulty swallowing, or any other associated symptoms.  Past Medical History  Diagnosis Date  . Spinal stenosis   . Hypertension   . Pulmonary embolism ([place]) 04/2001; 09/19/2014    "after gallbladder OR; "  . High cholesterol   . Myocardial infarction ([place]) 10/2010 X 3    "while hospitalized"  . Pneumonia 04/2014  . Sleep apnea     "mild" (09/19/2014)  . Type II diabetes mellitus ([place])     "dx'd in 2007; lost weight; no RX for ~ 4 yr now" (09/19/2014)  . Anemia   . History of blood transfusion 1985    related to hysterectomy  . Esophageal spasm   . Gastritis   .  Gastroenteritis   . IBS (irritable bowel syndrome)   . GERD (gastroesophageal reflux disease)   . Headache     "couple /month lately" (09/19/2014)  . Migraine     "a few times/yr" (09/19/2014)  . Osteoarthritis     "qwhere; mostly around my joints" (09/19/2014)  . Spondylosis   . Chronic back pain     "upper and lower" (09/19/2014)  . Depression     "major" (09/19/2014)  . PTSD (post-traumatic stress disorder)   . Schizoaffective disorder ([place])   . Snoring     a. sleep study 5/16: No OSA  . High blood pressure   . Bilateral renal cysts   . Gallstones   . DVT (deep venous thrombosis) [facility])    Past Surgical History  Procedure Laterality Date  . Excision/release bursa hip Right 12/1984  . Total hip arthroplasty Left 04-06-2014  . Tonsillectomy and adenoidectomy  ~ 1968  . Appendectomy  1985  . Abdominal hysterectomy  1985  . Cholecystectomy  2002  . Hernia repair  1985  . Umbilical hernia repair  1985  . Vena cava filter placement  03/2014  . Back surgery    . Anterior cervical decomp/discectomy fusion  10/2012  . Breast cyst excision Right   . Cardiac catheterization   2000's; 2009  . Left heart catheterization with coronary angiogram N/A 12/09/2014    Procedure:  LEFT HEART CATHETERIZATION WITH CORONARY ANGIOGRAM;  Surgeon: Burnell Blanks, MD;  Location: [facility] CATH LAB;  Service: Cardiovascular;  Laterality: N/A;   Family History  Problem Relation Age of Onset  . Heart attack Neg Hx   . Stroke Mother     Deceased  . Lung cancer Father     Deceased  . Healthy Daughter   . Healthy Son     x 2  . Other Son     Suicide   Social History  Substance Use Topics  . Smoking status: Former Smoker -- 1.50 packs/day for 10 years    Types: Cigarettes    Quit date: 04/12/1979  . Smokeless tobacco: Never Used  . Alcohol Use: No   OB History    No data available     Review of Systems  A complete 10 system review of systems was obtained and all systems are negative except  as noted in the HPI and PMH.   Allergies  Abilify; Augmentin; Bactrim; Ceftin; Coconut oil; Diclofenac; Dye fdc red; Indocin; Linaclotide; Lisinopril; Morphine; Morphine and related; Simvastatin; Sulfa antibiotics; Sulfamethoxazole-trimethoprim; Wellbutrin; and Quetiapine fumarate  Home Medications   Prior to Admission medications   Medication Sig Start Date End Date Taking? Authorizing Provider  apixaban (ELIQUIS) 5 MG TABS tablet Take 1 tablet (5 mg total) by mouth 2 (two) times daily. 11/16/15   [name], MD  BIOTIN PO Take 1 tablet by mouth daily.    Historical Provider, MD  calcium carbonate (TUMS - DOSED IN MG ELEMENTAL CALCIUM) 500 MG chewable tablet Chew 1 tablet by mouth daily as needed for indigestion or heartburn.    Historical Provider, MD  carvedilol (COREG) 3.125 MG tablet Take 1 tablet (3.125 mg total) by mouth 2 (two) times daily with a meal. 10/22/15   Burnell Blanks, MD  cyclobenzaprine (FLEXERIL) 10 MG tablet Take 1 tablet (10 mg total) by mouth 3 (three) times daily as needed for muscle spasms. 08/13/15   [name], MD  dicyclomine (BENTYL) 10 MG capsule TAKE 2 CAPSULES BY MOUTH EVERY SIX HOURS 04/09/15   [name], MD  diltiazem (CARDIZEM CD) 120 MG 24 hr capsule Take 1 capsule (120 mg total) by mouth daily. 09/28/15   [name], MD  diphenhydrAMINE (BENADRYL) 25 MG tablet Take 1-2 tablets (25-50 mg total) by mouth every 6 (six) hours as needed for itching (Rash). 07/14/15   Antonietta Breach, PA-C  EPINEPHrine 0.3 mg/0.3 mL IJ SOAJ injection Inject 0.3 mLs (0.3 mg total) into the muscle once. 10/12/15   [name], MD  escitalopram (LEXAPRO) 10 MG tablet TAKE 1 TABLET BY MOUTH EVERY DAY 10/01/15   [name], MD  furosemide (LASIX) 20 MG tablet TAKE 1 TABLET BY MOUTH EVERY DAY Patient taking differently: TAKE 1 TABLET BY MOUTH EVERY DAY prn 08/14/15   [name], MD  gabapentin (NEURONTIN) 600 MG tablet Take 0.5 tablets (300 mg total)  by mouth every 8 (eight) hours. 01/24/16   [name], MD  hydrOXYzine (ATARAX/VISTARIL) 25 MG tablet Take 1 tablet (25 mg total) by mouth every 6 (six) hours as needed for itching. 07/14/15   Antonietta Breach, PA-C  Iron-Vitamin C (VITRON-C) 65-125 MG TABS Take 1 tablet by mouth daily.    Historical Provider, MD  isosorbide mononitrate (IMDUR) 30 MG 24 hr tablet Take 1 tablet (30 mg total) by mouth daily. 03/22/15   Burnell Blanks, MD  Multiple Vitamin (MULTIVITAMIN WITH MINERALS) TABS tablet Take 2  tablets by mouth daily.     Historical Provider, MD  nitroGLYCERIN (NITROSTAT) 0.4 MG SL tablet Place 1 tablet (0.4 mg total) under the tongue every 5 (five) minutes as needed for chest pain. 01/09/16   [name], MD  ondansetron (ZOFRAN) 4 MG tablet Take 1 tablet (4 mg total) by mouth every 8 (eight) hours as needed for nausea or vomiting. 08/15/15   [name], MD  oxyCODONE-acetaminophen (PERCOCET/ROXICET) 5-325 MG tablet Take 1 tablet by mouth 2 (two) times daily as needed for severe pain. 02/01/16   [name], MD  pantoprazole (PROTONIX) 40 MG tablet TAKE ONE TABLET BY MOUTH EVERY DAY 09/03/15   [name], MD  penicillin v potassium (VEETID) 500 MG tablet Take 1 tablet (500 mg total) by mouth 3 (three) times daily. 01/18/16   Domenic Moras, PA-C  potassium chloride (K-DUR) 10 MEQ tablet Take 1 tablet (10 mEq total) by mouth 2 (two) times daily. Patient taking differently: Take 10 mEq by mouth 2 (two) times daily as needed.  10/22/15   Burnell Blanks, MD  traZODone (DESYREL) 50 MG tablet Take 1 tablet (50 mg total) by mouth at bedtime. Patient taking differently: Take 50 mg by mouth at bedtime as needed for sleep.  04/24/15   [name], MD  triamcinolone (NASACORT AQ) 55 MCG/ACT AERO nasal inhaler Place 2 sprays into the nose daily. 10/02/15   Wilhelmina Mcardle, MD   BP 132/72 mmHg  Pulse 88  Temp(Src) 98.2 F (36.8 C)  Resp 18  SpO2 99% Physical Exam   Constitutional: She is oriented to person, place, and time. She appears well-developed and well-nourished. No distress.  HENT:  Head: Normocephalic and atraumatic.  Diffuse dental decay Several missing teeth Tenderness to palpation of the left upper gingiva NO sublingual tenderness or swelling No submental or submandibular swelling  Eyes: Conjunctivae and EOM are normal.  Neck: Normal range of motion. Neck supple. No tracheal deviation present.  Cardiovascular: Normal rate, regular rhythm and normal heart sounds.   Pulmonary/Chest: Effort normal and breath sounds normal. No respiratory distress.  Musculoskeletal: Normal range of motion.  Neurological: She is alert and oriented to person, place, and time.  Skin: Skin is warm and dry.  Psychiatric: She has a normal mood and affect. Her behavior is normal.  Nursing note and vitals reviewed.   ED Course  Procedures (including critical care time)  DIAGNOSTIC STUDIES: Oxygen Saturation is 99% on RA, normal by my interpretation.    COORDINATION OF CARE: 4:07 PM Discussed treatment plan with pt at bedside and pt agreed to plan.  Labs Review Labs Reviewed - No data to display  Imaging Review No results found. I have personally reviewed and evaluated these images and lab results as part of my medical decision-making.   EKG Interpretation None      MDM   Final diagnoses:  None  Patient with toothache.  No gross abscess.  Exam unconcerning for Ludwig's angina or spread of infection.  Will treat with Clindamycin since she was recently on PCN and did not feel that it helped.  She has Percocet at home.   Urged patient to follow-up with dentist.     I personally performed the services described in this documentation, which was scribed in my presence. The recorded information has been reviewed and is accurate.   Hyman Bible, PA-C 02/04/16 [id number]  Hyman Bible, PA-C 02/04/16 [id number]  Merrily Pew, MD 02/05/16 [phone]

## 2016-02-03 LAB — 25-HYDROXY VITAMIN D LCMS D2+D3
25-Hydroxy, Vitamin D-2: 1.2 ng/mL
25-Hydroxy, Vitamin D-3: 24 ng/mL
25-Hydroxy, Vitamin D: 25 ng/mL — ABNORMAL LOW

## 2016-02-04 NOTE — Progress Notes (Signed)
Quick Note:   Low Vitamin D Results Normal levels: between 30 and 100 ng/mL. Vitamin D Insufficiency: Levels between 20-30 ng/ml are defined as a "Vitamin D insufficiency". Vitamin D Deficiency: Levels below 20 ng/ml, is diagnosed as a "Vitamin D Deficiency".  Common causes include: dietary insufficiency; inadequate sun exposure; inability to absorb vitamin D from the intestines; or inability to process it due to kidney or liver disease. Low 25-hydroxyvitamin D: A low blood level of 25-hydroxyvitamin D may mean that a person is not getting enough exposure to sunlight or enough dietary vitamin D to meet his or her body's demand or that there is a problem with its absorption from the intestines. Occasionally, drugs used to treat seizures, particularly phenytoin (Dilantin), can interfere with the production of 25-hydroxyvitamin D in the liver. There is some evidence that vitamin D deficiency may increase the risk of some cancers, immune diseases, and cardiovascular disease. Low 1,25-dihydroxyvitamin D: A low level of 1,25-dihydroxyvitamin D can be seen in kidney disease and is one of the earliest changes to occur in persons with early kidney failure. Associated complications may include: hypocalcemia, hypophosphatemia, and reduced bone density. Associated symptoms: Vitamin D deficiencies and insufficiencies may be associated with fatigue, weakness, bone pain, joint pain, and muscle pain. Recommendations: Patient may benefit from taking over-the-counter Vitamin D3 supplements. I recommend a vitamin D + Calcium supplements. "Natures Bounty", a brand easily found in most pharmacies, has a formulation containing Calcium 1200 mg plus Vitamin D3 1000 IU, in Softgels capsules that are easy to swallow. This should be taken once a day, preferably in the morning as vitamin D will increase energy levels and make it difficult to fall asleep, if taken at night. Patients with levels lower than 20 ng/ml should contact  their primary care physicians to receive replacement therapy. Vitamin D3 can be obtained over-the-counter, without a prescription. Vitamin D2 requires a prescription and it is used for replacement therapy.     ______ 

## 2016-02-04 NOTE — Progress Notes (Signed)
Quick Note:  Normal levels of C-Reactive Protein for our Lab are less than 1.0 mg/L. C-reactive protein (CRP) is produced by the liver. The level of CRP rises when there is inflammation throughout the body. CRP goes up in response to inflammation. High levels suggests the presence of chronic inflammation but do not identify its location or cause. High levels have been observed in obese patients, individuals with bacterial infections, chronic inflammation, or flare-ups of inflammatory conditions. Drops of previously elevated levels suggest that the inflammation or infection is subsiding and/or responding to treatment. ______ 

## 2016-02-04 NOTE — Progress Notes (Signed)
Quick Note:   A normal sedimentation rate should be below 30 mm/hr. The sed rate is an acute phase reactant that indirectly measures the degree of inflammation present in the body. It can be acute, developing rapidly after trauma, injury or infection, for example, or can occur over an extended time (chronic) with conditions such as autoimmune diseases or cancer. The ESR is not diagnostic; it is a non-specific, screening test that may be elevated in a number of these different conditions. It provides general information about the presence or absence of an inflammatory condition.  The combined elevation of the ESR & CRP, may be suggestive of an autoimmune disease. Should this be the case, we will inquire if the patient has had a rheumatologic evaluation looking at the RF levels, ANA levels, and CBC.  ______

## 2016-02-05 ENCOUNTER — Ambulatory Visit (INDEPENDENT_AMBULATORY_CARE_PROVIDER_SITE_OTHER): Payer: Medicare Other | Admitting: Gastroenterology

## 2016-02-05 ENCOUNTER — Encounter: Payer: Self-pay | Admitting: Gastroenterology

## 2016-02-05 VITALS — BP 124/70 | HR 82 | Ht 66.0 in | Wt 214.0 lb

## 2016-02-05 DIAGNOSIS — R1310 Dysphagia, unspecified: Secondary | ICD-10-CM

## 2016-02-05 DIAGNOSIS — K224 Dyskinesia of esophagus: Secondary | ICD-10-CM

## 2016-02-05 DIAGNOSIS — I201 Angina pectoris with documented spasm: Secondary | ICD-10-CM | POA: Diagnosis not present

## 2016-02-05 DIAGNOSIS — K5903 Drug induced constipation: Secondary | ICD-10-CM | POA: Diagnosis not present

## 2016-02-05 DIAGNOSIS — F1190 Opioid use, unspecified, uncomplicated: Secondary | ICD-10-CM

## 2016-02-05 LAB — COMPLIANCE DRUG ANALYSIS, UR: PDF: 0

## 2016-02-05 MED ORDER — POLYETHYLENE GLYCOL 3350 17 GM/SCOOP PO POWD: ORAL | Status: DC

## 2016-02-05 MED ORDER — DILTIAZEM HCL ER 60 MG PO CP12: 60.0000 mg | ORAL_CAPSULE | Freq: Four times a day (QID) | ORAL | Status: DC | PRN

## 2016-02-05 NOTE — Telephone Encounter (Signed)
I see the patient has established with pain management I would like for her to follow with them for her chronic pain. She may give them a call.  She is currently taking narcotics, muscle relaxant, and gabapentin. If she would like a referral to physical therapy I will cosign that order for you thanks

## 2016-02-05 NOTE — Progress Notes (Signed)
HPI :  60 y/o female here to be evaluated for dysphagia and constipation. She is a new patient to our practice. 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 She has an IVC filter in place.   She reports a history of chronic constipation. She has a BM upwards of every 1-3 weeks or so. She needs to use an intervention to help stimulate her bowels. She reports taking Valerie "dollar general version of ex-lax", and takes "pinches of salt and pepper" which helps at times to stimulate a bowel movement. She takes it once per week, as well as periodic enemas. 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 She has parasthesias in her arms at times from this. She has had a L hip replacement. She takes roughly 4 percocets per day. NO blood in Valerie stools. She passes hard stools, sometimes with straining. She has tried Miralax in Valerie past but has not taken it regularly. She has been to th ER in Valerie past for possible impaction due to severe constipation. She has not been on high dose miralax. She has been on Amitiza in Valerie past which gave her diarrhea and was too strong.    She reports she has a history of "esophageal spasms" when previously evaluated in [place] for dysphagia. She has had a remote upper endoscopy, and she reports having had a prior esophageal manometry showing an abnormality.  She reports she has dysphagia to pills and liquids, feels it in Valerie sternal notch. It occurs with solids as well. She reports it will occur daily with many meals. She takes her pills with apple sauce to help her swallow it. She report slongstanding symptoms. She was placed on diltiazem for this issue 120mg  once daily. She reports when on this regimen it helps her swallowing compared to not taking it. She has not been on a higher dose than 120mg  daily. She does have some reflux symptoms at times with pyrosis. She takes protonix 40mg  q HS which does a good  job controlling her symptoms.    Endoscopic history reviewed: Colonoscopy 02/21/2013 - hemorrhoids, otherwise normal colon Colonoscopy 02/11/2010 - fair prep, normal exam, melanosis coli Colonoscopy 09/14/2009 - normal colon, hemorrhoids Colonoscopy 05/19/2003 - hemorrhoids, normal colon otherwise  Past Medical History  Diagnosis Date  . Spinal stenosis   . Hypertension   . Pulmonary embolism ([facility]) 04/2001; 09/19/2014    "after gallbladder OR; "  . High cholesterol   . Myocardial infarction ([facility]) 10/2010 X 3    "while hospitalized"  . Pneumonia 04/2014  . Sleep apnea     "mild" (09/19/2014)  . Type II diabetes mellitus ([facility])     "dx'd in 2007; lost weight; no RX for ~ 4 yr now" (09/19/2014)  . Anemia   . History of blood transfusion 1985    related to hysterectomy  . Esophageal spasm   . Gastritis   . Gastroenteritis   . IBS (irritable bowel syndrome)   . GERD (gastroesophageal reflux disease)   . Headache     "couple /month lately" (09/19/2014)  . Migraine     "a few times/yr" (09/19/2014)  . Osteoarthritis     "qwhere; mostly around my joints" (09/19/2014)  . Spondylosis   . Chronic back pain     "upper and lower" (09/19/2014)  . Depression     "major" (09/19/2014)  . PTSD (post-traumatic stress disorder)   . Schizoaffective disorder ([facility])   .  Snoring     a. sleep study 5/16: No OSA  . High blood pressure   . Bilateral renal cysts   . Gallstones   . DVT (deep venous thrombosis) [facility])      Past Surgical History  Procedure Laterality Date  . Excision/release bursa hip Right 12/1984  . Total hip arthroplasty Left 04-06-2014  . Tonsillectomy and adenoidectomy  ~ 1968  . Appendectomy  1985  . Abdominal hysterectomy  1985  . Cholecystectomy  2002  . Hernia repair  1985  . Umbilical hernia repair  1985  . Vena cava filter placement  03/2014  . Back surgery    . Anterior cervical decomp/discectomy fusion  10/2012  . Breast cyst excision Right   . Cardiac catheterization    2000's; 2009  . Left heart catheterization with coronary angiogram N/A 12/09/2014    Procedure: LEFT HEART CATHETERIZATION WITH CORONARY ANGIOGRAM;  Surgeon: Valerie Hazel, MD;  Location: [facility];  Service: Cardiovascular;  Laterality: N/A;   Family History  Problem Relation Age of Onset  . Heart attack Neg Hx   . Stroke Mother     Deceased  . Lung cancer Father     Deceased  . Healthy Daughter   . Healthy Son     x 2  . Other Son     Suicide   Social History  Substance Use Topics  . Smoking status: Former Smoker -- 1.50 packs/day for 10 years    Types: Cigarettes    Quit date: 04/12/1979  . Smokeless tobacco: Never Used  . Alcohol Use: No   Current Outpatient Prescriptions  Medication Sig Dispense Refill  . apixaban (ELIQUIS) 5 MG TABS tablet Take 1 tablet (5 mg total) by mouth 2 (two) times daily. 60 tablet 3  . BIOTIN PO Take 1 tablet by mouth daily.    . calcium carbonate (TUMS - DOSED IN MG ELEMENTAL CALCIUM) 500 MG chewable tablet Chew 1 tablet by mouth daily as needed for indigestion or heartburn.    . carvedilol (COREG) 3.125 MG tablet Take 1 tablet (3.125 mg total) by mouth 2 (two) times daily with a meal. 60 tablet 11  . clindamycin (CLEOCIN) 150 MG capsule Take 3 capsules (450 mg total) by mouth 3 (three) times daily. 90 capsule 0  . cyclobenzaprine (FLEXERIL) 10 MG tablet Take 1 tablet (10 mg total) by mouth 3 (three) times daily as needed for muscle spasms. 100 tablet 5  . dicyclomine (BENTYL) 10 MG capsule TAKE 2 CAPSULES BY MOUTH EVERY SIX HOURS 240 capsule 5  . diltiazem (CARDIZEM CD) 120 MG 24 hr capsule Take 1 capsule (120 mg total) by mouth daily. 90 capsule 1  . diphenhydrAMINE (BENADRYL) 25 MG tablet Take 1-2 tablets (25-50 mg total) by mouth every 6 (six) hours as needed for itching (Rash). 30 tablet 0  . EPINEPHrine 0.3 mg/0.3 mL IJ SOAJ injection Inject 0.3 mLs (0.3 mg total) into Valerie muscle once. 1 Device 0  . escitalopram (LEXAPRO) 10 MG  tablet TAKE 1 TABLET BY MOUTH EVERY DAY 30 tablet 5  . furosemide (LASIX) 20 MG tablet TAKE 1 TABLET BY MOUTH EVERY DAY (Patient taking differently: TAKE 1 TABLET BY MOUTH EVERY DAY prn) 90 tablet 2  . gabapentin (NEURONTIN) 600 MG tablet Take 0.5 tablets (300 mg total) by mouth every 8 (eight) hours. 90 tablet 5  . hydrOXYzine (ATARAX/VISTARIL) 25 MG tablet Take 1 tablet (25 mg total) by mouth every 6 (six) hours as needed  for itching. 12 tablet 0  . Iron-Vitamin C (VITRON-C) 65-125 MG TABS Take 1 tablet by mouth daily.    . isosorbide mononitrate (IMDUR) 30 MG 24 hr tablet Take 1 tablet (30 mg total) by mouth daily. 30 tablet 11  . Multiple Vitamin (MULTIVITAMIN WITH MINERALS) TABS tablet Take 2 tablets by mouth daily.     . ondansetron (ZOFRAN) 4 MG tablet Take 1 tablet (4 mg total) by mouth every 8 (eight) hours as needed for nausea or vomiting. 90 tablet 5  . oxyCODONE-acetaminophen (PERCOCET/ROXICET) 5-325 MG tablet Take 1 tablet by mouth 2 (two) times daily as needed for severe pain. 30 tablet 0  . pantoprazole (PROTONIX) 40 MG tablet TAKE ONE TABLET BY MOUTH EVERY DAY 30 tablet 5  . penicillin v potassium (VEETID) 500 MG tablet Take 1 tablet (500 mg total) by mouth 3 (three) times daily. 30 tablet 0  . potassium chloride (K-DUR) 10 MEQ tablet Take 1 tablet (10 mEq total) by mouth 2 (two) times daily. (Patient taking differently: Take 10 mEq by mouth 2 (two) times daily as needed. ) 60 tablet 11  . traZODone (DESYREL) 50 MG tablet Take 1 tablet (50 mg total) by mouth at bedtime. (Patient taking differently: Take 50 mg by mouth at bedtime as needed for sleep. ) 30 tablet 5  . triamcinolone (NASACORT AQ) 55 MCG/ACT AERO nasal inhaler Place 2 sprays into Valerie nose daily. 1 Inhaler 12   No current facility-administered medications for this visit.   Allergies  Allergen Reactions  . Abilify [Aripiprazole] Other (See Comments)    Jerking movement, slow motor skills  . Augmentin [Amoxicillin-Pot  Clavulanate] Diarrhea and Nausea And Vomiting  . Bactrim [Sulfamethoxazole-Trimethoprim] Diarrhea  . Ceftin [Cefuroxime Axetil] Diarrhea  . Coconut Oil     Rash   . Diclofenac Other (See Comments)     Muscle Pain  . Dye Fdc Red [Red Dye]     "hair dye"  . Indocin [Indomethacin] Other (See Comments)    Migraines   . Linaclotide Diarrhea  . Lisinopril Diarrhea and Other (See Comments)    Other reaction(s): Dizziness, Vomiting  . Morphine Other (See Comments)    Chest Tightness  . Morphine And Related Other (See Comments)    Chest tightness   . Simvastatin Other (See Comments)    Muscle Pain  . Sulfa Antibiotics Diarrhea  . Sulfamethoxazole-Trimethoprim Diarrhea  . Wellbutrin [Bupropion] Other (See Comments)    Jerking movement Slurred speech  . Quetiapine Fumarate Palpitations     Review of Systems: All systems reviewed and negative except where noted in HPI.   [facility] Results  Component Value Date   WBC 7.6 01/09/2016   HGB 11.8* 01/09/2016   HCT 35.1 01/09/2016   MCV 88.4 01/09/2016   PLT 339 01/09/2016    [facility] Results  Component Value Date   CREATININE 0.81 01/09/2016   BUN 13 01/09/2016   NA 141 01/09/2016   K 4.5 01/09/2016   CL 107 01/09/2016   CO2 29 01/09/2016    [facility] Results  Component Value Date   ALT 15 12/28/2015   AST 16 12/28/2015   ALKPHOS 104 12/28/2015   BILITOT 0.5 12/28/2015     Physical Exam: BP 124/70 mmHg  Pulse 82  Ht 5\' 6"  (1.676 m)  Wt 214 lb (97.07 kg)  BMI 34.56 kg/m2 Constitutional: Pleasant,well-developed, female in no acute distress. HEENT: Normocephalic and atraumatic. Conjunctivae are normal. No scleral icterus. Neck supple.  Cardiovascular: Normal rate, regular rhythm.  Pulmonary/chest: Effort normal and breath sounds normal. No wheezing, rales or rhonchi. Abdominal: Soft, nondistended, protuberant, nontender. Bowel sounds active throughout. There are no masses palpable. No hepatomegaly. Extremities: no  edema Lymphadenopathy: No cervical adenopathy noted. Neurological: Alert and oriented to person place and time. Skin: Skin is warm and dry. No rashes noted. Psychiatric: Normal mood and affect. Behavior is normal.   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 I have asked to obtain her records from [place] so I can clarify her diagnosis. She reports significant benefit on Diltitazem however does have some ongoing symptoms. Recommend changing her from 120mg  ER to 60mg  short acting to use up to QID. If she tolerates higher dose of diltiazem with additional benefit, we may transition her to higher dose of longer acting form. She can take peppermint althoids PRN as well which may help with spasm. Otherwise await records.   Constipation - likely due to opioids. Prior colonoscopies normal. She may benefit from [facility] given chronic narcotic use but she hasn't really had a trial of other more traditional regimens. Recommend Miralax starting BID and titrate up as needed. If symptoms persist despite this she should contact me and we can consider trial of Movantik. She agreed.   [facility] Cellar, MD [facility] Pager [phone]  CC: [name], MD

## 2016-02-05 NOTE — Telephone Encounter (Signed)
Pt called about her left shoulder hurting her consisently and it does not go away. Pt wants to know if someone knows what is wrong with her left shoulder.   I did advise pt that her pain management referral is pending review.   Call pt @ [phone] or [phone]. Thank you!

## 2016-02-05 NOTE — Telephone Encounter (Signed)
Spoke with Mrs. [facility] again, she is very concerned that she can't get relief with her shoulder, and that the voltren was denied.  She tried another cream OTC that caused her to breakout.  Her skin seems to be sensitive now.  She had an additional episode at the store today that her left arm cramped and fingers turned up.  She has been "Babying" that arm as it isn't getting better, so she wasn't expecting that.  She is taking the muscle relaxers and just doesn't know what else she can do.  AS Dr. Caryl Bis is Out of the office, can you assist since you saw her for this issue. thanks

## 2016-02-05 NOTE — Telephone Encounter (Signed)
I have no further info

## 2016-02-05 NOTE — Telephone Encounter (Signed)
Left a detailed message that pain mgmt was the next step and it was arthritis in the shoulder per the xray.

## 2016-02-05 NOTE — Telephone Encounter (Signed)
Patient stated that she missed a call, she's aware of the previous note, however requested a call back [phone]

## 2016-02-05 NOTE — Patient Instructions (Signed)
We have sent the following medications to your pharmacy for you to pick up at your convenience: Miralax and Diltiazem.  We will get your records from your Gastroenterologist in [place].

## 2016-02-05 NOTE — Telephone Encounter (Signed)
She had a OV on 5/23 and a xray that shows arthritis of the shoulder, pain mgmt referral was in place.  Please advise if there is anything I can tell her?

## 2016-02-06 ENCOUNTER — Telehealth: Payer: Self-pay

## 2016-02-06 NOTE — Telephone Encounter (Signed)
Wanted to inform you patient's prior authorization for Diltiazem 60 mg was denied. Do you want patient to do anything different?

## 2016-02-06 NOTE — Telephone Encounter (Addendum)
Will work on appeal for Diltiazem. Patient notified that we are faxing over information for appeal. Patient verbalized understanding.

## 2016-02-06 NOTE — Telephone Encounter (Signed)
I don't understand. She is already taking diltiazem at 120mg , but long acting form. I had ordered her short acting 60mg  dose to allow [place] to titrate up the dose so she can tolerate it, but its the same medication she is already one. Any way to discuss this with her insurance company to appeal? Thanks

## 2016-02-06 NOTE — Progress Notes (Signed)
Quick Note:  NOTE: This forensic urine drug screen (UDS) test was conducted using a state-of-the-art ultra high performance liquid chromatography and mass spectrometry system (UPLC/MS-MS), the most sophisticated and accurate method available. UPLC/MS-MS is 1,000 times more precise and accurate than standard gas chromatography and mass spectrometry (GC/MS). This system can analyze 26 drug categories and 180 drug compounds.  Unreported substance: Codeine  The findings of this UDT were reported as abnormal due to inconsistencies with expected results. An unreported substance was identified in the sample. Expectations were based on the medication history provided by the patient at the time of sample collection. These results are of concern due to the following possibilities:   1. The use of multiple providers, suggesting the illegal practice of "Doctor Shopping", in violation of [place] Statutes, as well as our medication agreement.   2. The use of unsanctioned and possibly illegal substances, in violation of  Statutes, as well as our medication agreement. ______

## 2016-02-07 DIAGNOSIS — F4542 Pain disorder with related psychological factors: Secondary | ICD-10-CM | POA: Diagnosis not present

## 2016-02-08 NOTE — Telephone Encounter (Signed)
Denied, see other notes. thanks

## 2016-02-12 ENCOUNTER — Ambulatory Visit: Payer: Self-pay | Admitting: Pain Medicine

## 2016-02-12 IMAGING — CR DG HIP COMPLETE 2+V*L*
1 series · 2 of 2 positions shown · non-contrast
Comparison: December 31, 2013

CLINICAL DATA: Status post total hip arthroplasty

EXAM:
LEFT HIP - COMPLETE 2+ VIEW

[Series 1: ap · 0.17mm/px · 2 of 2 slices shown]
[im 1/2]
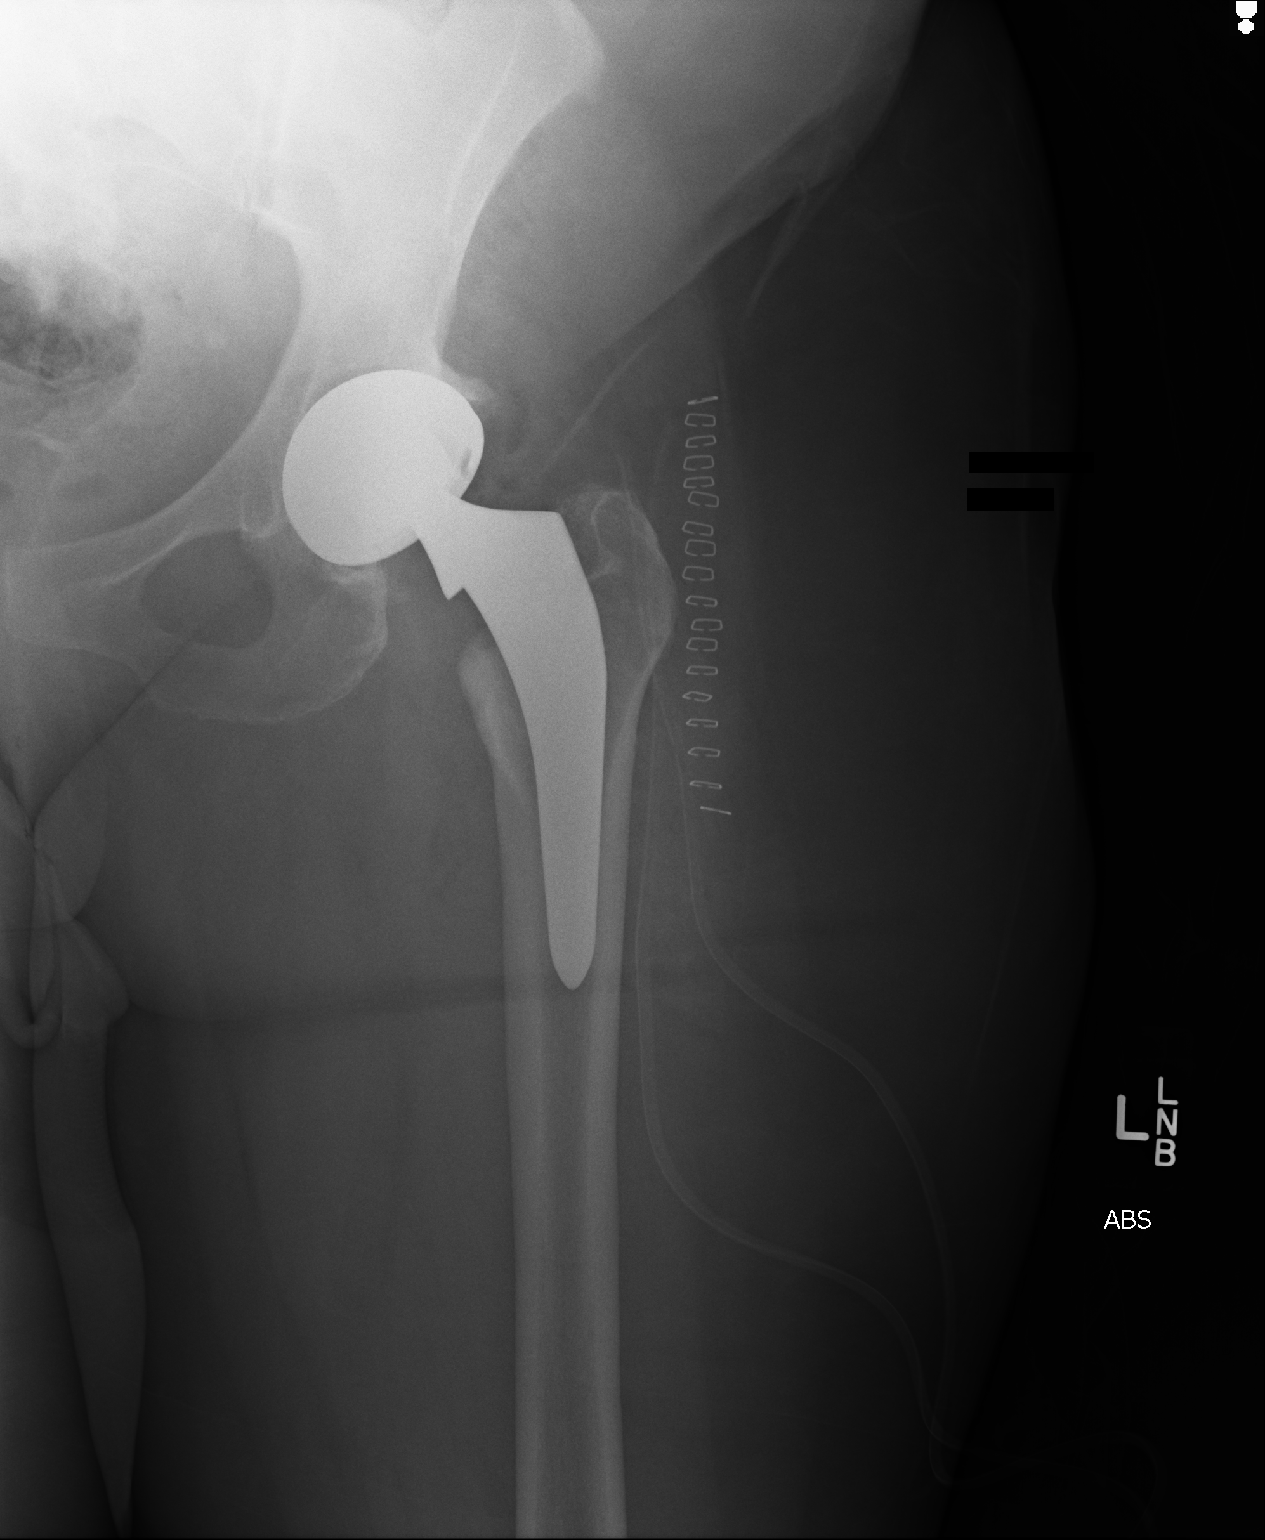
[im 2/2]
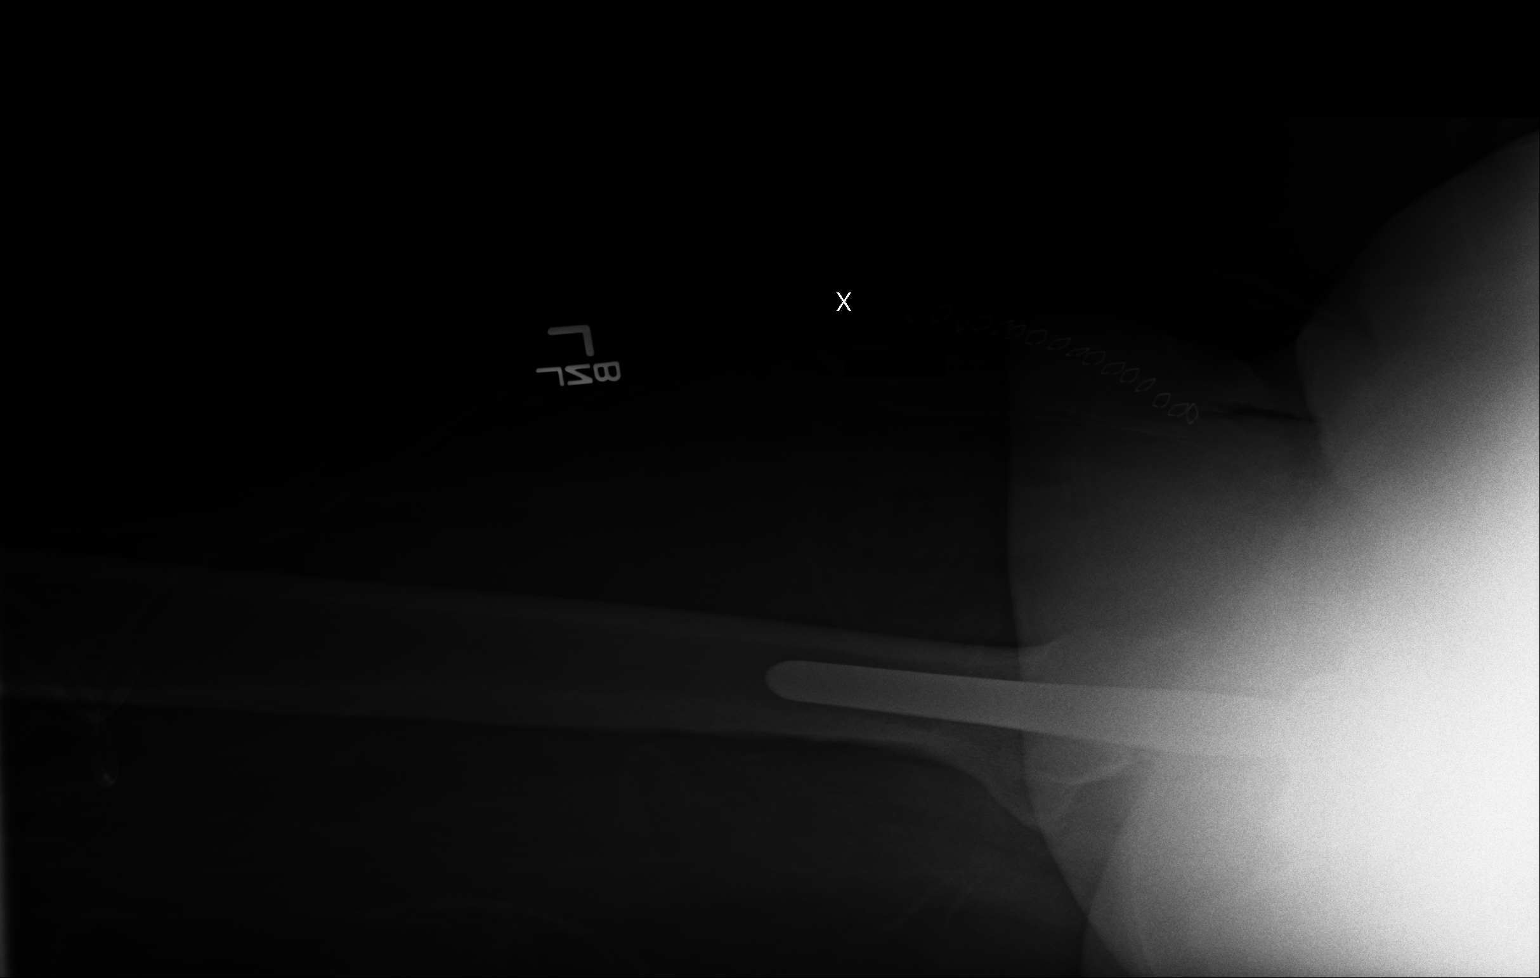

[2 of 2 positions shown; findings below may reference images not displayed]

FINDINGS: Frontal and lateral views were obtained. There is a total hip
prosthesis on the left which appears well seated. No acute fracture
or dislocation. There is a surgical drain with the tip in the
lateral hip joint region.
IMPRESSION: Prosthesis appears well seated.  No fracture or dislocation.

## 2016-02-13 ENCOUNTER — Encounter (HOSPITAL_COMMUNITY): Payer: Self-pay | Admitting: Nurse Practitioner

## 2016-02-13 ENCOUNTER — Inpatient Hospital Stay: Payer: Medicare Other | Attending: Oncology | Admitting: Oncology

## 2016-02-13 ENCOUNTER — Emergency Department (HOSPITAL_COMMUNITY)
Admission: EM | Admit: 2016-02-13 | Discharge: 2016-02-13 | Disposition: A | Discharge status: Home / Self Care | Payer: Medicare Other | Attending: Emergency Medicine | Admitting: Emergency Medicine

## 2016-02-13 VITALS — BP 127/87 | HR 92 | Temp 97.1°F | Resp 18 | Wt 200.4 lb

## 2016-02-13 DIAGNOSIS — Z7901 Long term (current) use of anticoagulants: Secondary | ICD-10-CM | POA: Insufficient documentation

## 2016-02-13 DIAGNOSIS — R531 Weakness: Secondary | ICD-10-CM | POA: Insufficient documentation

## 2016-02-13 DIAGNOSIS — M79602 Pain in left arm: Secondary | ICD-10-CM

## 2016-02-13 DIAGNOSIS — G8929 Other chronic pain: Secondary | ICD-10-CM | POA: Diagnosis not present

## 2016-02-13 DIAGNOSIS — N281 Cyst of kidney, acquired: Secondary | ICD-10-CM

## 2016-02-13 DIAGNOSIS — Z79899 Other long term (current) drug therapy: Secondary | ICD-10-CM | POA: Diagnosis not present

## 2016-02-13 DIAGNOSIS — K224 Dyskinesia of esophagus: Secondary | ICD-10-CM | POA: Diagnosis not present

## 2016-02-13 DIAGNOSIS — D649 Anemia, unspecified: Secondary | ICD-10-CM | POA: Insufficient documentation

## 2016-02-13 DIAGNOSIS — M545 Low back pain: Secondary | ICD-10-CM | POA: Diagnosis not present

## 2016-02-13 DIAGNOSIS — I252 Old myocardial infarction: Secondary | ICD-10-CM | POA: Insufficient documentation

## 2016-02-13 DIAGNOSIS — E7800 Pure hypercholesterolemia, unspecified: Secondary | ICD-10-CM | POA: Diagnosis not present

## 2016-02-13 DIAGNOSIS — F4310 Post-traumatic stress disorder, unspecified: Secondary | ICD-10-CM | POA: Insufficient documentation

## 2016-02-13 DIAGNOSIS — Z87891 Personal history of nicotine dependence: Secondary | ICD-10-CM | POA: Diagnosis not present

## 2016-02-13 DIAGNOSIS — I10 Essential (primary) hypertension: Secondary | ICD-10-CM

## 2016-02-13 DIAGNOSIS — M4800 Spinal stenosis, site unspecified: Secondary | ICD-10-CM

## 2016-02-13 DIAGNOSIS — Z86718 Personal history of other venous thrombosis and embolism: Secondary | ICD-10-CM | POA: Diagnosis not present

## 2016-02-13 DIAGNOSIS — M1990 Unspecified osteoarthritis, unspecified site: Secondary | ICD-10-CM | POA: Diagnosis not present

## 2016-02-13 DIAGNOSIS — Z86711 Personal history of pulmonary embolism: Secondary | ICD-10-CM

## 2016-02-13 DIAGNOSIS — M542 Cervicalgia: Secondary | ICD-10-CM | POA: Insufficient documentation

## 2016-02-13 DIAGNOSIS — Z8701 Personal history of pneumonia (recurrent): Secondary | ICD-10-CM | POA: Insufficient documentation

## 2016-02-13 DIAGNOSIS — Z801 Family history of malignant neoplasm of trachea, bronchus and lung: Secondary | ICD-10-CM | POA: Diagnosis not present

## 2016-02-13 DIAGNOSIS — M25512 Pain in left shoulder: Secondary | ICD-10-CM | POA: Diagnosis not present

## 2016-02-13 DIAGNOSIS — K219 Gastro-esophageal reflux disease without esophagitis: Secondary | ICD-10-CM

## 2016-02-13 DIAGNOSIS — F329 Major depressive disorder, single episode, unspecified: Secondary | ICD-10-CM | POA: Insufficient documentation

## 2016-02-13 DIAGNOSIS — F259 Schizoaffective disorder, unspecified: Secondary | ICD-10-CM | POA: Diagnosis not present

## 2016-02-13 DIAGNOSIS — K589 Irritable bowel syndrome without diarrhea: Secondary | ICD-10-CM | POA: Insufficient documentation

## 2016-02-13 DIAGNOSIS — Z96642 Presence of left artificial hip joint: Secondary | ICD-10-CM | POA: Diagnosis not present

## 2016-02-13 DIAGNOSIS — E119 Type 2 diabetes mellitus without complications: Secondary | ICD-10-CM | POA: Insufficient documentation

## 2016-02-13 DIAGNOSIS — G4730 Sleep apnea, unspecified: Secondary | ICD-10-CM | POA: Diagnosis not present

## 2016-02-13 DIAGNOSIS — Z8719 Personal history of other diseases of the digestive system: Secondary | ICD-10-CM | POA: Diagnosis not present

## 2016-02-13 IMAGING — CR DG CHEST 1V PORT
1 series · 1 of 1 positions shown · non-contrast
Comparison: 01/01/2014 chest CT

CLINICAL DATA: Fever, pain.

EXAM:
PORTABLE CHEST - 1 VIEW

[ap]
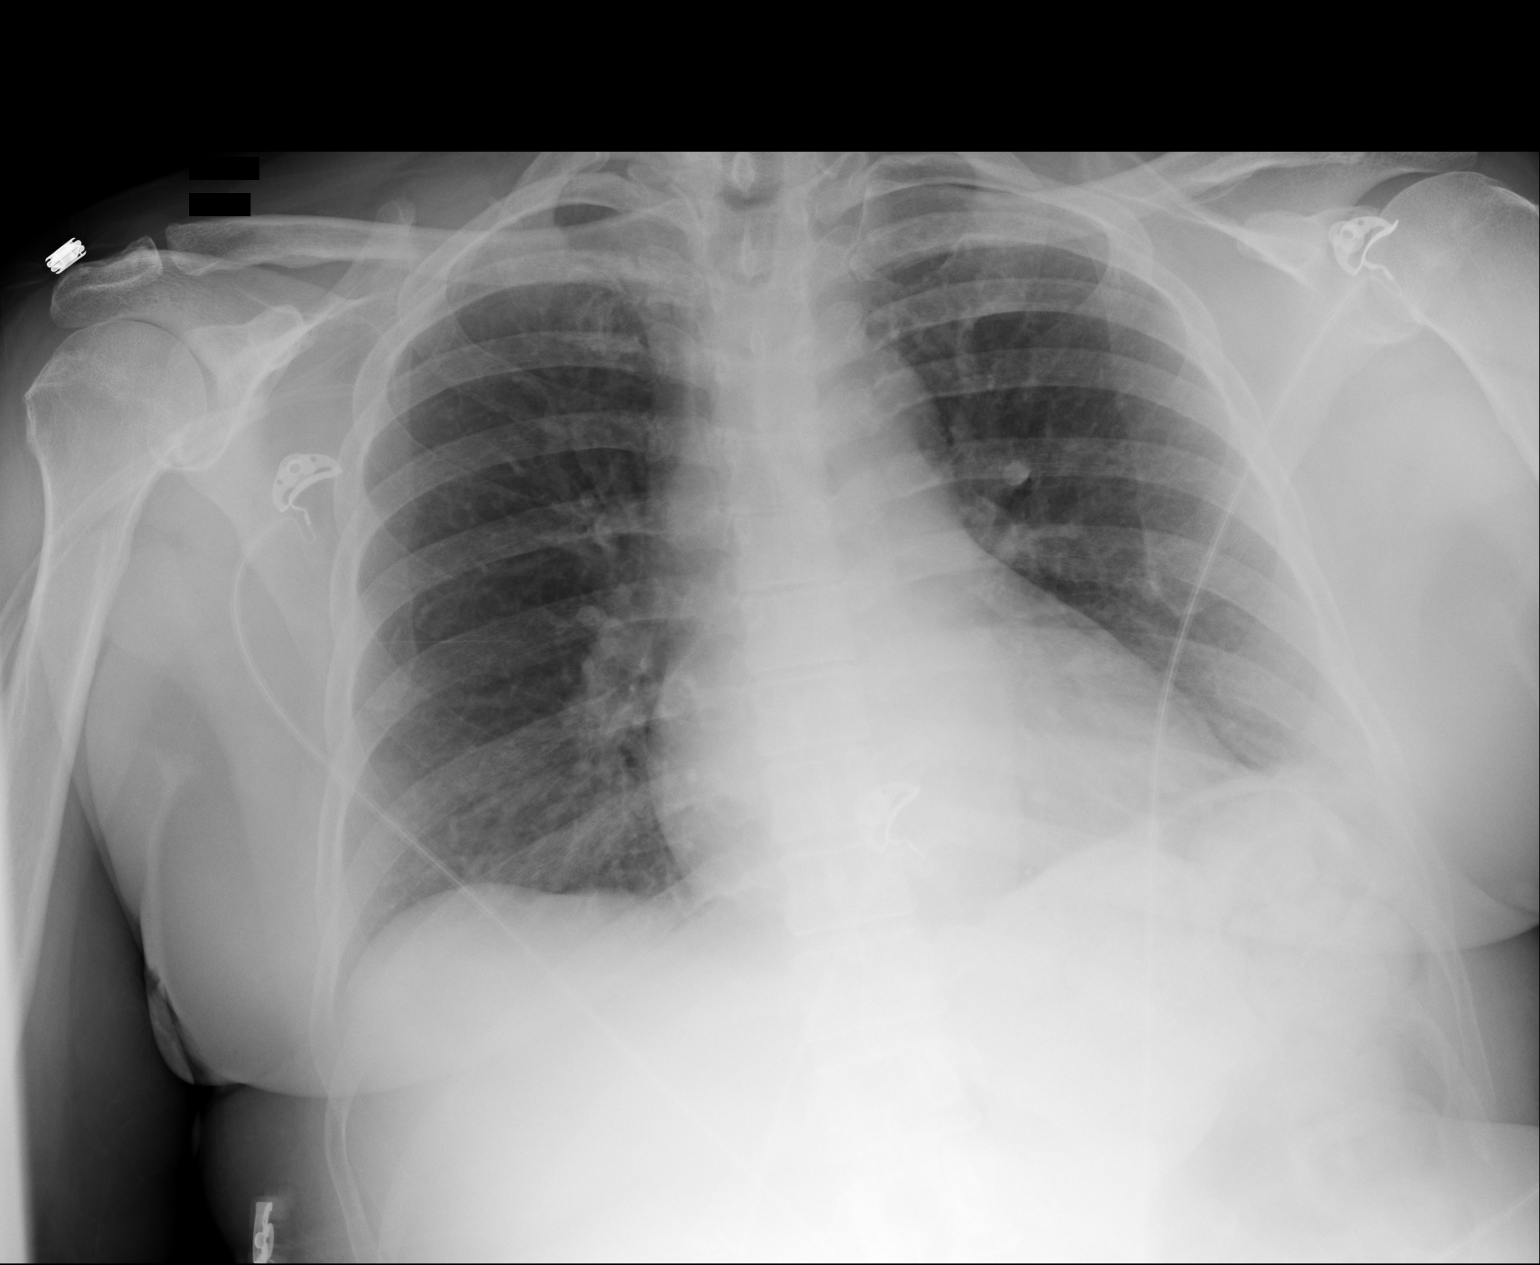

[1 of 1 positions shown; findings below may reference images not displayed]

FINDINGS: Cardiomediastinal contours are within normal range. Mild left lung
base opacity. Small left effusion not excluded. No pneumothorax.
Cervical fusion hardware. No acute osseous finding.
IMPRESSION: Mild left lung base opacity; atelectasis versus infiltrate.

## 2016-02-13 MED ORDER — METHOCARBAMOL 500 MG PO TABS: 500.0000 mg | ORAL_TABLET | Freq: Two times a day (BID) | ORAL | Status: DC

## 2016-02-13 NOTE — ED Provider Notes (Signed)
CSN: [id number]     Arrival date & time 02/13/16  1736 History   First MD Initiated Contact with Patient 02/13/16 1841     Chief Complaint  Patient presents with  . Arm Pain     (Consider location/radiation/quality/duration/timing/severity/associated sxs/prior Treatment) HPI   Valerie Vazquez is a 60 year old female with past medical history of pulmonary embolism, MI, chronic back pain, hip pain, neck pain, DM who presents to the ED today complaining of left-sided neck pain and left shoulder pain. She states it has been worsening over the last month. Today when she woke up she had extreme pain in her left shoulder that radiated down her left arm. She feels like her hands are intermittently cramping up as well. She states she saw her hematologist today. She is supposed to be getting a myelogram done by neurosurgery but given that she has history of pulmonary embolism and she is currently anticoagulated she needs to have a hematologist on board. Patient is also followed by pain management. She is currently unhappy with the pain management specialist she is seeing and is trying to switch. She's been taking home Percocet and Flexeril at home without relief of her symptoms. No bowel or bladder incontinence. She is ambulating without difficulty.  Past Medical History  Diagnosis Date  . Spinal stenosis   . Hypertension   . Pulmonary embolism ([place]) 04/2001; 09/19/2014    "after gallbladder OR; "  . High cholesterol   . Myocardial infarction ([place]) 10/2010 X 3    "while hospitalized"  . Pneumonia 04/2014  . Sleep apnea     "mild" (09/19/2014)  . Type II diabetes mellitus ([place])     "dx'd in 2007; lost weight; no RX for ~ 4 yr now" (09/19/2014)  . Anemia   . History of blood transfusion 1985    related to hysterectomy  . Esophageal spasm   . Gastritis   . Gastroenteritis   . IBS (irritable bowel syndrome)   . GERD (gastroesophageal reflux disease)   . Headache     "couple /month lately"  (09/19/2014)  . Migraine     "a few times/yr" (09/19/2014)  . Osteoarthritis     "qwhere; mostly around my joints" (09/19/2014)  . Spondylosis   . Chronic back pain     "upper and lower" (09/19/2014)  . Depression     "major" (09/19/2014)  . PTSD (post-traumatic stress disorder)   . Schizoaffective disorder ([place])   . Snoring     a. sleep study 5/16: No OSA  . High blood pressure   . Bilateral renal cysts   . Gallstones   . DVT (deep venous thrombosis) [facility])    Past Surgical History  Procedure Laterality Date  . Excision/release bursa hip Right 12/1984  . Total hip arthroplasty Left 04-06-2014  . Tonsillectomy and adenoidectomy  ~ 1968  . Appendectomy  1985  . Abdominal hysterectomy  1985  . Cholecystectomy  2002  . Hernia repair  1985  . Umbilical hernia repair  1985  . Vena cava filter placement  03/2014  . Back surgery    . Anterior cervical decomp/discectomy fusion  10/2012  . Breast cyst excision Right   . Cardiac catheterization   2000's; 2009  . Left heart catheterization with coronary angiogram N/A 12/09/2014    Procedure: LEFT HEART CATHETERIZATION WITH CORONARY ANGIOGRAM;  Surgeon: Burnell Blanks, MD;  Location: [facility] CATH LAB;  Service: Cardiovascular;  Laterality: N/A;   Family History  Problem Relation Age of  Onset  . Heart attack Neg Hx   . Stroke Mother     Deceased  . Lung cancer Father     Deceased  . Healthy Daughter   . Healthy Son     x 2  . Other Son     Suicide   Social History  Substance Use Topics  . Smoking status: Former Smoker -- 1.50 packs/day for 10 years    Types: Cigarettes    Quit date: 04/12/1979  . Smokeless tobacco: Never Used  . Alcohol Use: No   OB History    No data available     Review of Systems  All other systems reviewed and are negative.     Allergies  Abilify; Augmentin; Bactrim; Ceftin; Coconut oil; Diclofenac; Dye fdc red; Indocin; Linaclotide; Lisinopril; Morphine; Morphine and related; Simvastatin; Sulfa  antibiotics; Sulfamethoxazole-trimethoprim; Wellbutrin; and Quetiapine fumarate  Home Medications   Prior to Admission medications   Medication Sig Start Date End Date Taking? Authorizing Provider  apixaban (ELIQUIS) 5 MG TABS tablet Take 1 tablet (5 mg total) by mouth 2 (two) times daily. 11/16/15   [name], MD  BIOTIN PO Take 1 tablet by mouth daily.    Historical Provider, MD  calcium carbonate (TUMS - DOSED IN MG ELEMENTAL CALCIUM) 500 MG chewable tablet Chew 1 tablet by mouth daily as needed for indigestion or heartburn.    Historical Provider, MD  carvedilol (COREG) 3.125 MG tablet Take 1 tablet (3.125 mg total) by mouth 2 (two) times daily with a meal. 10/22/15   Burnell Blanks, MD  clindamycin (CLEOCIN) 150 MG capsule Take 3 capsules (450 mg total) by mouth 3 (three) times daily. 02/02/16   Heather Laisure, PA-C  cyclobenzaprine (FLEXERIL) 10 MG tablet Take 1 tablet (10 mg total) by mouth 3 (three) times daily as needed for muscle spasms. 08/13/15   [name], MD  dicyclomine (BENTYL) 10 MG capsule TAKE 2 CAPSULES BY MOUTH EVERY SIX HOURS Patient not taking: Reported on 02/13/2016 04/09/15   [name], MD  diltiazem (CARDIZEM SR) 60 MG 12 hr capsule Take 1 capsule (60 mg total) by mouth every 6 (six) hours as needed. 02/05/16   Manus Gunning, MD  diphenhydrAMINE (BENADRYL) 25 MG tablet Take 1-2 tablets (25-50 mg total) by mouth every 6 (six) hours as needed for itching ([place]). 07/14/15   Antonietta Breach, PA-C  EPINEPHrine 0.3 mg/0.3 mL IJ SOAJ injection Inject 0.3 mLs (0.3 mg total) into the muscle once. 10/12/15   [name], MD  escitalopram (LEXAPRO) 10 MG tablet TAKE 1 TABLET BY MOUTH EVERY DAY 10/01/15   [name], MD  furosemide (LASIX) 20 MG tablet TAKE 1 TABLET BY MOUTH EVERY DAY Patient taking differently: TAKE 1 TABLET BY MOUTH EVERY DAY prn 08/14/15   [name], MD  gabapentin (NEURONTIN) 600 MG tablet Take 0.5 tablets (300 mg total)  by mouth every 8 (eight) hours. 01/24/16   [name], MD  Iron-Vitamin C (VITRON-C) 65-125 MG TABS Take 1 tablet by mouth daily.    Historical Provider, MD  isosorbide mononitrate (IMDUR) 30 MG 24 hr tablet Take 1 tablet (30 mg total) by mouth daily. 03/22/15   Burnell Blanks, MD  Multiple Vitamin (MULTIVITAMIN WITH MINERALS) TABS tablet Take 2 tablets by mouth daily.     Historical Provider, MD  ondansetron (ZOFRAN) 4 MG tablet Take 1 tablet (4 mg total) by mouth every 8 (eight) hours as needed for nausea or vomiting. 08/15/15  [name], MD  oxyCODONE-acetaminophen (PERCOCET/ROXICET) 5-325 MG tablet Take 1 tablet by mouth 2 (two) times daily as needed for severe pain. 02/01/16   [name], MD  pantoprazole (PROTONIX) 40 MG tablet TAKE ONE TABLET BY MOUTH EVERY DAY 09/03/15   [name], MD  potassium chloride (K-DUR) 10 MEQ tablet Take 1 tablet (10 mEq total) by mouth 2 (two) times daily. Patient taking differently: Take 10 mEq by mouth 2 (two) times daily as needed.  10/22/15   Burnell Blanks, MD  traZODone (DESYREL) 50 MG tablet Take 1 tablet (50 mg total) by mouth at bedtime. Patient taking differently: Take 50 mg by mouth at bedtime as needed for sleep.  04/24/15   [name], MD  triamcinolone (NASACORT AQ) 55 MCG/ACT AERO nasal inhaler Place 2 sprays into the nose daily. 10/02/15   Wilhelmina Mcardle, MD   BP 103/69 mmHg  Pulse 103  Temp(Src) 98.5 F (36.9 C) (Oral)  Resp 17  SpO2 97% Physical Exam  Constitutional: She is oriented to person, place, and time. She appears well-developed and well-nourished. No distress.  HENT:  Head: Normocephalic and atraumatic.  Mouth/Throat: No oropharyngeal exudate.  Eyes: Conjunctivae and EOM are normal. Pupils are equal, round, and reactive to light. Right eye exhibits no discharge. Left eye exhibits no discharge. No scleral icterus.  Neck: Normal range of motion.  Cardiovascular: Normal rate, regular rhythm,  normal heart sounds and intact distal pulses.  Exam reveals no gallop and no friction rub.   No murmur heard. Pulmonary/Chest: Effort normal and breath sounds normal. No respiratory distress. She has no wheezes. She has no rales. She exhibits no tenderness.  Abdominal: Soft. She exhibits no distension. There is no tenderness. There is no guarding.  Musculoskeletal: Normal range of motion. She exhibits no edema.  Mild TTP over left cervical paraspinal muscles and left trapezius. No decreased range of motion of left shoulder or left elbow. No obvious bony deformity. No midline spinal tenderness.  Neurological: She is alert and oriented to person, place, and time. No cranial nerve deficit.  Strength 5/5 throughout. No sensory deficits.  No dysmetria. No pronator drift. No facial droop. No gait abnormality.  Skin: Skin is warm and dry. No rash noted. She is not diaphoretic. No erythema. No pallor.  Psychiatric: She has a normal mood and affect. Her behavior is normal.  Nursing note and vitals reviewed.   ED Course  Procedures (including critical care time) Labs Review Labs Reviewed - No data to display  Imaging Review No results found. I have personally reviewed and evaluated these images and lab results as part of my medical decision-making.   EKG Interpretation None      MDM   Final diagnoses:  Chronic neck pain   Patient presents to the ED today complaining of worsening neck pain and left shoulder pain. Patient has long-standing history of chronic neck pain. Upon review of patient's chart patient was previously seen by pain management who voiced concerns of substance abuse disorder and drug-seeking behavior. In their note they recommended the patient receive a psychiatric consult for these issues before providing pain medication. I also reviewed notes from patient's family provider who deferred giving her narcotic pain medication until she could be seen by another pain management  specialist. Feel the patient's symptoms are likely exacerbation of chronic pain. She has no neurological deficits on exam. Feel that if patient should receive narcotic pain medication should come from pain management. Will not provide patient  with any narcotics today. Patient also states that she has supposed to be getting a myelogram scheduled for her ongoing neck pain. Feel that this will be helpful for patients symptoms. Encourage her to follow-up with neurosurgery. Will provide a different muscle relaxer this patient states her current one doesn't seem to work for her. Prescription for Robaxin provided. Discussed this treatment plan with patient who is agreeable. Return precautions outlined in patient discharge instructions.    Dondra Spry [place], PA-C 02/15/16 0020  Davonna Belling, MD 02/15/16 2138

## 2016-02-13 NOTE — ED Notes (Signed)
PA at bedside.

## 2016-02-13 NOTE — Progress Notes (Signed)
States is having severe pain in left arm that started 1 month ago. Has seen neurosurgeon and xray was ordered that showed arthritis. Pt unable to move left arm and is very weak in that arm as well.

## 2016-02-13 NOTE — ED Notes (Addendum)
Pt c/o 1 month history of L sided neck pain. The pain radiates into her arm. She describes as "sharp, shooting.' Today the pain has moved into her back and down her L leg. She has history of neck surgery. Pain is worse with movement. She has tried her pain medication at home which helps but the pain persists. She denies any injuries. She reports she has seen several doctors for this pain over past month. She is ambulatory, mae.

## 2016-02-13 NOTE — Discharge Instructions (Signed)
Myelography Myelography is an X-ray exam in which a special dye (contrast medium) is used to examine your spinal cord and nerve roots. The contrast medium helps to illuminate the spinal structures under examination. The exam is used to detect spinal cord problems, including spinal cord injury, disk ruptures, cysts, and tumors.  LET [facility] CARE PROVIDER KNOW ABOUT:  Any allergies you have.  All medicines you are taking, including vitamins, herbs, eyedrops, and over-the-counter medicines and creams.  Previous problems you or members of your family have had with the use of anesthetics or contrast media.  Any blood disorders you have.  Other health problems you have. RISKS AND COMPLICATIONS Generally, myelography is a safe procedure. However, as with any surgical procedure, complications can occur. Possible complications associated with myelography include:  Spinal fluid infection.  Allergic reaction to the contrast medium.  Loss of spinal fluid (can lead to severe headaches).  Seizures (rare). BEFORE THE PROCEDURE You will need to arrange for someone to drive you home after the procedure.  PROCEDURE   You will be positioned face down on a table.  Medicine may be given to you to help you relax.  A numbing medicine will be applied to the insertion site.  A needle will be inserted between your vertebrae. An imaging technique called fluoroscopy will be used to help your health care provider see the needle between the bones of your spine and guide it into the sac that surrounds your spinal cord and nerves (dura).  Contrast medium will be injected into the dura.  The table you lie on may be tilted in different directions to move the contrast medium around the dura.  A series of X-rays or computed tomography (CT) will be done. AFTER THE PROCEDURE After your procedure, you will be taken to a recovery area where you will lie flat with your head in an elevated position for a few  hours before being discharged. This reduces the risk of a severe headache.    This information is not intended to replace advice given to you by your health care provider. Make sure you discuss any questions you have with your health care provider.   Follow-up with your neurosurgeon as soon as possible to schedule your myelogram. Continue taking home pain medication. Take Robaxin for muscle relaxation. Take this instead of your home Flexeril. Schedule appointment with new pain management provider as soon as possible. Your symptoms still do not improve please schedule appointment with your PCP. Return to the emergency department a few experience loss of control of bowels or bladder, loss of consciousness, fevers, chills, weakness in your extremities.

## 2016-02-14 ENCOUNTER — Telehealth: Payer: Self-pay | Admitting: Family Medicine

## 2016-02-14 DIAGNOSIS — G894 Chronic pain syndrome: Secondary | ICD-10-CM

## 2016-02-14 NOTE — Telephone Encounter (Signed)
Pt called about wanting to switch from Dr Risa Grill to Dr Naaman Plummer pain management. Pt called Dr Naaman Plummer office and she told that she needs to get referral from Dr Caryl Bis to transfer to Dr Naaman Plummer.   Pt has a hematogist already on board Dr Pietro Cassis who is at the cancer center in [facility]. So that pt can get the mylogram done.   Pt feels like she's getting worse and is in a lot of pain.  Call pt @ [phone] or [phone]. Thank you!

## 2016-02-14 NOTE — Telephone Encounter (Signed)
Referral is been placed.

## 2016-02-14 NOTE — Telephone Encounter (Signed)
Please advise 

## 2016-02-14 NOTE — Telephone Encounter (Signed)
Left a detailed message that referral has been placed.

## 2016-02-18 ENCOUNTER — Telehealth: Payer: Self-pay | Admitting: *Deleted

## 2016-02-18 ENCOUNTER — Ambulatory Visit (INDEPENDENT_AMBULATORY_CARE_PROVIDER_SITE_OTHER): Payer: Medicare Other | Admitting: Family Medicine

## 2016-02-18 ENCOUNTER — Encounter: Payer: Self-pay | Admitting: Family Medicine

## 2016-02-18 ENCOUNTER — Ambulatory Visit (INDEPENDENT_AMBULATORY_CARE_PROVIDER_SITE_OTHER): Payer: Medicare Other

## 2016-02-18 ENCOUNTER — Ambulatory Visit: Payer: Self-pay

## 2016-02-18 VITALS — BP 130/76 | HR 94 | Temp 98.6°F | Ht 66.0 in | Wt 210.2 lb

## 2016-02-18 DIAGNOSIS — I201 Angina pectoris with documented spasm: Secondary | ICD-10-CM | POA: Diagnosis not present

## 2016-02-18 DIAGNOSIS — R140 Abdominal distension (gaseous): Secondary | ICD-10-CM

## 2016-02-18 DIAGNOSIS — N644 Mastodynia: Secondary | ICD-10-CM | POA: Diagnosis not present

## 2016-02-18 DIAGNOSIS — Z1239 Encounter for other screening for malignant neoplasm of breast: Secondary | ICD-10-CM | POA: Diagnosis not present

## 2016-02-18 DIAGNOSIS — M25512 Pain in left shoulder: Secondary | ICD-10-CM

## 2016-02-18 MED ORDER — OXYCODONE-ACETAMINOPHEN 5-325 MG PO TABS: 1.0000 | ORAL_TABLET | Freq: Two times a day (BID) | ORAL | Status: DC | PRN

## 2016-02-18 NOTE — Assessment & Plan Note (Signed)
She reports recent intermittent perceived distention of her abdomen. Benign abdominal exam today. Does report a history of a ovarian cyst in the past. Has had a hysterectomy, appendectomy, and cholecystectomy. Reports bowel movements daily though is passing lots of gas. Has had issues with constipation in the past. Discussed potential causes. Discussed potential workup. Initially plan was to obtain a CT scan of abdomen and pelvis given intermittent distention though per our computer system Medicare would not cover it for this diagnosis. We will obtain a abdominal x-ray 2 view to evaluate further. She is given return precautions.

## 2016-02-18 NOTE — Progress Notes (Signed)
Patient ID: Valerie Vazquez, female   DOB: [birth year], 60 y.o.   MRN: [id number]  [name], MD Phone: [phone]  Valerie Vazquez is a 60 y.o. female who presents today for follow-up.  Left shoulder pain: This been going on for last several weeks. Previously saw a nurse practitioner and x-ray revealed AC joint arthritis. Notes it continues to hurt. She was evaluated in the emergency room as well and this is felt to be related to her chronic pain. Notes it hurts in her left shoulder with movement. It is a sharp stabbing pain. Occasionally causes her fingers to spasm. Has been taking Percocet for this with some benefit. Being followed by neurosurgery given prior neck discomfort and they want to do a myelogram though she needs some communication between hematology and the neurosurgeon for this. Occasionally sharp pain radiates from her shoulder to her chest and chest pressure or shortness of breath. No radiation down her arms. No exertional chest discomfort. No diaphoresis. She does note some swelling in her armpit with this as well. Has not gotten a mammogram yet.  Patient additionally reports some abdominal distention recently. it has been intermittent and resolves and then comes back. No abdominal pain. No vomiting or diarrhea. Some nausea. Has lots of gas and belches. Does report bowel movements daily.  PMH: Former smoker   ROS see history of present illness  Objective  Physical Exam Filed Vitals:   02/18/16 0845  BP: 130/76  Pulse: 94  Temp: 98.6 F (37 C)    BP Readings from Last 3 Encounters:  02/18/16 130/76  02/13/16 114/67  02/13/16 127/87   Wt Readings from Last 3 Encounters:  02/18/16 210 lb 3.2 oz (95.346 kg)  02/13/16 200 lb 6.4 oz (90.9 kg)  02/05/16 214 lb (97.07 kg)    Physical Exam  Constitutional: She is well-developed, well-nourished, and in no distress.  HENT:  Head: Normocephalic and atraumatic.  Right Ear: External ear normal.  Left  Ear: External ear normal.  Cardiovascular: Normal rate, regular rhythm and normal heart sounds.   Pulmonary/Chest: Effort normal and breath sounds normal.  Bilateral breast with no masses or skin changes, bilateral axillas with no masses, left axilla mildly tender with no skin changes, upper left chest and breast mildly tender with no skin changes or underlying lesions palpated  Abdominal: Soft. Bowel sounds are normal. She exhibits no distension. There is no tenderness. There is no rebound and no guarding.  Musculoskeletal:  Some left shoulder tenderness over the lateral aspect and some mild anterior upper chest tenderness with no skin changes, no left shoulder swelling, there may be minimal swelling in the left axilla though this is difficult to appreciate, full range of motion left shoulder though does have some discomfort on internal rotation, positive empty can on the left, left hand warm and well perfused, able to move left arm with no difficulty Right shoulder with no tenderness or swelling, full range of motion with mild discomfort on internal rotation, negative empty can, right hand warm and well-perfused, able to move right arm with no difficulty  Neurological: She is alert. Gait normal.  5 out of 5 deltoid strength  Skin: She is not diaphoretic.     Assessment/Plan: Please see individual problem list.  Abdominal distension She reports recent intermittent perceived distention of her abdomen. Benign abdominal exam today. Does report a history of a ovarian cyst in the past. Has had a hysterectomy, appendectomy, and cholecystectomy. Reports bowel movements daily though is passing  lots of gas. Has had issues with constipation in the past. Discussed potential causes. Discussed potential workup. Initially plan was to obtain a CT scan of abdomen and pelvis given intermittent distention though per our computer system Medicare would not cover it for this diagnosis. We will obtain a abdominal x-ray  2 view to evaluate further. She is given return precautions.  Left shoulder pain Patient with persistent left shoulder pain. Perceived swelling in left axilla that may or may not be present. Benign breast exam today. 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 Discussed this with the patient. Also discussed potential that this could be from her neck though with exam this would be less likely and that she needs to be evaluated by her neurosurgeon to complete the myelogram as planned. Discussed helping her coordinate this with her neurosurgeon and hematologist. Given tenderness of her upper left breast and swelling in her axilla we will obtain a mammogram and ultrasounds of her left breast and axilla to evaluate further. I did refill her Percocet for a short course she is almost out and is in the process of changing pain management physicians. She'll continue to monitor. She is given return precautions.    Orders Placed This Encounter  Procedures  . MM Digital Diagnostic Bilat    Standing Status: Future     Number of Occurrences:      Standing Expiration Date: 04/19/2017    Order Specific Question:  Reason for Exam (SYMPTOM  OR DIAGNOSIS REQUIRED)    Answer:  left breast pain, left axillary swelling    Order Specific Question:  Preferred imaging location?    Answer:  [facility]  . US BREAST COMPLETE UNI LEFT INC AXILLA    Standing Status: Future     Number of Occurrences:      Standing Expiration Date: 04/19/2017    Order Specific Question:  Reason for Exam (SYMPTOM  OR DIAGNOSIS REQUIRED)    Answer:  left breast pain and axillary swelling    Order Specific Question:  Preferred imaging location?    Answer:  [facility]  . DG Abd 2 Views    Standing Status: Future     Number of Occurrences: 1     Standing Expiration Date: 04/20/2017    Order Specific Question:  Reason for Exam (SYMPTOM  OR DIAGNOSIS REQUIRED)    Answer:  Abdominal distension    Order  Specific Question:  Preferred imaging location?    Answer:  [facility] ordered this encounter  Medications  . oxyCODONE-acetaminophen (PERCOCET/ROXICET) 5-325 MG tablet    Sig: Take 1 tablet by mouth 2 (two) times daily as needed for severe pain.    Dispense:  30 tablet    Refill:  0      [name], MD [place]

## 2016-02-18 NOTE — Assessment & Plan Note (Signed)
Patient with persistent left shoulder pain. Perceived swelling in left axilla that may or may not be present. Benign breast exam today. 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 Discussed this with the patient. Also discussed potential that this could be from her neck though with exam this would be less likely and that she needs to be evaluated by her neurosurgeon to complete the myelogram as planned. Discussed helping her coordinate this with her neurosurgeon and hematologist. Given tenderness of her upper left breast and swelling in her axilla we will obtain a mammogram and ultrasounds of her left breast and axilla to evaluate further. I did refill her Percocet for a short course she is almost out and is in the process of changing pain management physicians. She'll continue to monitor. She is given return precautions.

## 2016-02-18 NOTE — Patient Instructions (Signed)
Nice to see you. We are going to obtain a mammogram and ultrasound evaluate the swelling under your right armpit. Your shoulder discomfort is likely related to arthritis or rotator cuff issues though could be coming from your neck. It will be important for you to keep her follow-up appointments with neurosurgery. We will obtain an x-ray of your abdomen to evaluate the intermittent distention.

## 2016-02-18 NOTE — Progress Notes (Signed)
Pre visit review using our clinic review tool, if applicable. No additional management support is needed unless otherwise documented below in the visit note. 

## 2016-02-18 NOTE — Telephone Encounter (Signed)
Patient has requested to switch locations of her pain management,dur to it taking up to three months to get an appointment. She requested to be seen by Dr. Margot Chimes for pain management. [phone] . Please update patient  Pt contact [phone]

## 2016-02-18 NOTE — Telephone Encounter (Signed)
Referral has been sent to dr Naaman Plummer office

## 2016-02-19 ENCOUNTER — Other Ambulatory Visit: Payer: Self-pay | Admitting: *Deleted

## 2016-02-20 ENCOUNTER — Telehealth: Payer: Self-pay | Admitting: Gastroenterology

## 2016-02-20 NOTE — Telephone Encounter (Signed)
Patient had records sent from [place] regarding prior workup as follows: EGD 12/29/07 - medium size HH, mild GEJ Shatski ring, gastritis, normal duodenum - no dilation performed Colonoscopy 09/14/2009 - normal exam, no polyps  Valerie Vazquez she had reported a history of esophageal spasm and prior manometry but I did not see any records of this.  Can you see how she is doing on the higher dose of dilatiazem and peppermint althoids? She may need to call [facility] and obtain her records and see if they have anything else in her file that we can review (specifically the manometry reports). Thanks

## 2016-02-21 ENCOUNTER — Telehealth: Payer: Self-pay | Admitting: Surgical

## 2016-02-21 DIAGNOSIS — N644 Mastodynia: Secondary | ICD-10-CM

## 2016-02-21 NOTE — Telephone Encounter (Signed)
Verbal order given  

## 2016-02-21 NOTE — Telephone Encounter (Signed)
Spoke with patient and she will try to get the manometry reports to [place]. She has not started the medications yet but plans to in July.

## 2016-02-21 NOTE — Telephone Encounter (Signed)
Breast center called to get order put in for [place] left breast. Patient was in clinic. Verbal given by Dr. Caryl Bis to place order for left breast pain.

## 2016-02-23 NOTE — Progress Notes (Signed)
[place]  Telephone:([phone]) [phone] [phone]) [phone]  ID: Valerie Vazquez OB: 10/18/1955  MR#: [id number]  [id number]  Patient Care Team: [name], MD as PCP - General (Family Medicine)  CHIEF COMPLAINT:  Chief Complaint  Patient presents with  . thrombosis    INTERVAL HISTORY: Patient returns to clinic today for further evaluation and discussion of her laboratory results. She continues to have severe left arm pain and weakness that is currently being evaluated by a neurosurgeon. She otherwise feels well. She has no other neurologic complaints. She denies any recent fevers or illnesses. She denies any chest pain or shortness of breath. She has a good appetite and denies weight loss. She denies any nausea, vomiting, constipation, or diarrhea. She has no urinary complaints. Patient offers no further specific complaints.  REVIEW OF SYSTEMS:   Review of Systems  Constitutional: Negative.  Negative for fever, weight loss and malaise/fatigue.  Respiratory: Negative.  Negative for cough and shortness of breath.   Cardiovascular: Negative.  Negative for chest pain.  Gastrointestinal: Negative.  Negative for blood in stool and melena.  Genitourinary: Negative.  Negative for hematuria.  Musculoskeletal: Positive for joint pain.  Neurological: Negative.  Negative for weakness.  Endo/Heme/Allergies: Does not bruise/bleed easily.  Psychiatric/Behavioral: Negative.     As per HPI. Otherwise, a complete review of systems is negatve.  PAST MEDICAL HISTORY: Past Medical History  Diagnosis Date  . Spinal stenosis   . Hypertension   . Pulmonary embolism ([place]) 04/2001; 09/19/2014    "after gallbladder OR; "  . High cholesterol   . Myocardial infarction ([place]) 10/2010 X 3    "while hospitalized"  . Pneumonia 04/2014  . Sleep apnea     "mild" (09/19/2014)  . Type II diabetes mellitus ([place])     "dx'd in 2007; lost weight; no RX for ~ 4 yr now" (09/19/2014)    . Anemia   . History of blood transfusion 1985    related to hysterectomy  . Esophageal spasm   . Gastritis   . Gastroenteritis   . IBS (irritable bowel syndrome)   . GERD (gastroesophageal reflux disease)   . Headache     "couple /month lately" (09/19/2014)  . Migraine     "a few times/yr" (09/19/2014)  . Osteoarthritis     "qwhere; mostly around my joints" (09/19/2014)  . Spondylosis   . Chronic back pain     "upper and lower" (09/19/2014)  . Depression     "major" (09/19/2014)  . PTSD (post-traumatic stress disorder)   . Schizoaffective disorder ([place])   . Snoring     a. sleep study 5/16: No OSA  . High blood pressure   . Bilateral renal cysts   . Gallstones   . DVT (deep venous thrombosis) ([place])     PAST SURGICAL HISTORY: Past Surgical History  Procedure Laterality Date  . Excision/release bursa hip Right 12/1984  . Total hip arthroplasty Left 04-06-2014  . Tonsillectomy and adenoidectomy  ~ 1968  . Appendectomy  1985  . Abdominal hysterectomy  1985  . Cholecystectomy  2002  . Hernia repair  1985  . Umbilical hernia repair  1985  . Vena cava filter placement  03/2014  . Back surgery    . Anterior cervical decomp/discectomy fusion  10/2012  . Breast cyst excision Right   . Cardiac catheterization   2000's; 2009  . Left heart catheterization with coronary angiogram N/A 12/09/2014    Procedure: LEFT HEART CATHETERIZATION WITH CORONARY  ANGIOGRAM;  Surgeon: Burnell Blanks, MD;  Location: [facility] CATH LAB;  Service: Cardiovascular;  Laterality: N/A;    FAMILY HISTORY Family History  Problem Relation Age of Onset  . Heart attack Neg Hx   . Stroke Mother     Deceased  . Lung cancer Father     Deceased  . Healthy Daughter   . Healthy Son     x 2  . Other Son     Suicide       ADVANCED DIRECTIVES:    HEALTH MAINTENANCE: Social History  Substance Use Topics  . Smoking status: Former Smoker -- 1.50 packs/day for 10 years    Types: Cigarettes    Quit date:  04/12/1979  . Smokeless tobacco: Never Used  . Alcohol Use: No     Colonoscopy:  PAP:  Bone density:  Lipid panel:  Allergies  Allergen Reactions  . Abilify [Aripiprazole] Other (See Comments)    Jerking movement, slow motor skills  . Augmentin [Amoxicillin-Pot Clavulanate] Diarrhea and Nausea And Vomiting  . Bactrim [Sulfamethoxazole-Trimethoprim] Diarrhea  . Ceftin [Cefuroxime Axetil] Diarrhea  . Coconut Oil     Rash   . Diclofenac Other (See Comments)     Muscle Pain  . Dye Fdc Red [Red Dye]     "hair dye"  . Indocin [Indomethacin] Other (See Comments)    Migraines   . Linaclotide Diarrhea  . Lisinopril Diarrhea and Other (See Comments)    Other reaction(s): Dizziness, Vomiting  . Morphine Other (See Comments)    Chest Tightness  . Morphine And Related Other (See Comments)    Chest tightness   . Simvastatin Other (See Comments)    Muscle Pain  . Sulfa Antibiotics Diarrhea  . Sulfamethoxazole-Trimethoprim Diarrhea  . Wellbutrin [Bupropion] Other (See Comments)    Jerking movement Slurred speech  . Quetiapine Fumarate Palpitations    Current Outpatient Prescriptions  Medication Sig Dispense Refill  . apixaban (ELIQUIS) 5 MG TABS tablet Take 1 tablet (5 mg total) by mouth 2 (two) times daily. 60 tablet 3  . BIOTIN PO Take 1 tablet by mouth daily.    . calcium carbonate (TUMS - DOSED IN MG ELEMENTAL CALCIUM) 500 MG chewable tablet Chew 1 tablet by mouth daily as needed for indigestion or heartburn.    . carvedilol (COREG) 3.125 MG tablet Take 1 tablet (3.125 mg total) by mouth 2 (two) times daily with a meal. 60 tablet 11  . clindamycin (CLEOCIN) 150 MG capsule Take 3 capsules (450 mg total) by mouth 3 (three) times daily. 90 capsule 0  . cyclobenzaprine (FLEXERIL) 10 MG tablet Take 1 tablet (10 mg total) by mouth 3 (three) times daily as needed for muscle spasms. 100 tablet 5  . diltiazem (CARDIZEM SR) 60 MG 12 hr capsule Take 1 capsule (60 mg total) by mouth every 6  (six) hours as needed. 120 capsule 3  . diphenhydrAMINE (BENADRYL) 25 MG tablet Take 1-2 tablets (25-50 mg total) by mouth every 6 (six) hours as needed for itching (Rash). 30 tablet 0  . EPINEPHrine 0.3 mg/0.3 mL IJ SOAJ injection Inject 0.3 mLs (0.3 mg total) into the muscle once. 1 Device 0  . escitalopram (LEXAPRO) 10 MG tablet TAKE 1 TABLET BY MOUTH EVERY DAY 30 tablet 5  . furosemide (LASIX) 20 MG tablet TAKE 1 TABLET BY MOUTH EVERY DAY (Patient taking differently: TAKE 1 TABLET BY MOUTH EVERY DAY prn) 90 tablet 2  . gabapentin (NEURONTIN) 600 MG tablet Take 0.5 tablets (  300 mg total) by mouth every 8 (eight) hours. 90 tablet 5  . Iron-Vitamin C (VITRON-C) 65-125 MG TABS Take 1 tablet by mouth daily.    . isosorbide mononitrate (IMDUR) 30 MG 24 hr tablet Take 1 tablet (30 mg total) by mouth daily. 30 tablet 11  . Multiple Vitamin (MULTIVITAMIN WITH MINERALS) TABS tablet Take 2 tablets by mouth daily.     . ondansetron (ZOFRAN) 4 MG tablet Take 1 tablet (4 mg total) by mouth every 8 (eight) hours as needed for nausea or vomiting. 90 tablet 5  . pantoprazole (PROTONIX) 40 MG tablet TAKE ONE TABLET BY MOUTH EVERY DAY 30 tablet 5  . potassium chloride (K-DUR) 10 MEQ tablet Take 1 tablet (10 mEq total) by mouth 2 (two) times daily. (Patient taking differently: Take 10 mEq by mouth 2 (two) times daily as needed. ) 60 tablet 11  . traZODone (DESYREL) 50 MG tablet Take 1 tablet (50 mg total) by mouth at bedtime. (Patient taking differently: Take 50 mg by mouth at bedtime as needed for sleep. ) 30 tablet 5  . triamcinolone (NASACORT AQ) 55 MCG/ACT AERO nasal inhaler Place 2 sprays into the nose daily. 1 Inhaler 12  . dicyclomine (BENTYL) 10 MG capsule TAKE 2 CAPSULES BY MOUTH EVERY SIX HOURS 240 capsule 5  . methocarbamol (ROBAXIN) 500 MG tablet Take 1 tablet (500 mg total) by mouth 2 (two) times daily. 20 tablet 0  . oxyCODONE-acetaminophen (PERCOCET/ROXICET) 5-325 MG tablet Take 1 tablet by mouth 2  (two) times daily as needed for severe pain. 30 tablet 0   No current facility-administered medications for this visit.    OBJECTIVE: Filed Vitals:   02/13/16 1404  BP: 127/87  Pulse: 92  Temp: 97.1 F (36.2 C)  Resp: 18     Body mass index is 32.36 kg/(m^2).    ECOG FS:0 - Asymptomatic  General: Well-developed, well-nourished, no acute distress. Eyes: Pink conjunctiva, anicteric sclera. Lungs: Clear to auscultation bilaterally. Heart: Regular rate and rhythm. No rubs, murmurs, or gallops. Abdomen: Soft, nontender, nondistended. No organomegaly noted, normoactive bowel sounds. Musculoskeletal: No edema, cyanosis, or clubbing. Neuro: Alert, answering all questions appropriately. Cranial nerves grossly intact. Skin: No rashes or petechiae noted. Psych: Normal affect.   LAB RESULTS:  Lab Results  Component Value Date   NA 141 01/09/2016   K 4.5 01/09/2016   CL 107 01/09/2016   CO2 29 01/09/2016   GLUCOSE 91 01/09/2016   BUN 13 01/09/2016   CREATININE 0.81 01/09/2016   CALCIUM 9.7 01/09/2016   PROT 7.9 12/28/2015   ALBUMIN 4.3 12/28/2015   AST 16 12/28/2015   ALT 15 12/28/2015   ALKPHOS 104 12/28/2015   BILITOT 0.5 12/28/2015   GFRNONAA >60 01/09/2016   GFRAA >60 01/09/2016    Lab Results  Component Value Date   WBC 7.6 01/09/2016   NEUTROABS 4.3 01/09/2016   HGB 11.8* 01/09/2016   HCT 35.1 01/09/2016   MCV 88.4 01/09/2016   PLT 339 01/09/2016     STUDIES: Dg Abd 2 Views  02/18/2016  CLINICAL DATA:  Abdominal distention. EXAM: ABDOMEN - 2 VIEW COMPARISON:  MRI 10/19/2015.  KUB 06/18/2015. FINDINGS: Surgical clips right upper quadrant. IVC filter noted in stable position. Stool noted throughout the colon. No bowel distention. No acute bony abnormality. Left hip replacement. IMPRESSION: 1.  Prior cholecystectomy.  IVC filter noted stable position. 2. Large amount stool in colon suggesting constipation. No bowel distention. 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  PLAN:    1. DVT/PE: Patient has a history of multiple PEs and DVTs. On further discussion with the patient, some appears to have been unprovoked whereas others she was noted to have a transient risk factors such as surgery or travel. She is now taking lifelong Eliquis. Given her history of multiple clotting episodes, agree with this recommendation. Full hypercoagulable is negative other than a mildly elevated dRVVT which is secondary to taking anticoagulation. Patient appears to have no distinct etiology from her propensity to develop blood clots. Continue Eliquis as prescribed.  No further follow up in needed. 2. Left shoulder/arm pain: Treatment per neurosurgery.    Patient expressed understanding and was in agreement with this plan. She also understands that She can call clinic at any time with any questions, concerns, or complaints.    Lloyd Huger, MD   02/23/2016 4:14 PM

## 2016-02-25 ENCOUNTER — Ambulatory Visit
Admission: RE | Admit: 2016-02-25 | Discharge: 2016-02-25 | Disposition: A | Discharge status: Home / Self Care | Payer: Medicare Other | Source: Ambulatory Visit | Attending: Family Medicine | Admitting: Family Medicine

## 2016-02-25 ENCOUNTER — Other Ambulatory Visit: Payer: Self-pay | Admitting: Family Medicine

## 2016-02-25 DIAGNOSIS — N644 Mastodynia: Secondary | ICD-10-CM

## 2016-02-26 ENCOUNTER — Encounter: Payer: Self-pay | Admitting: Family Medicine

## 2016-02-29 ENCOUNTER — Encounter (HOSPITAL_COMMUNITY): Payer: Self-pay | Admitting: *Deleted

## 2016-02-29 ENCOUNTER — Telehealth: Payer: Self-pay | Admitting: Surgical

## 2016-02-29 ENCOUNTER — Emergency Department (HOSPITAL_COMMUNITY)
Admission: EM | Admit: 2016-02-29 | Discharge: 2016-02-29 | Disposition: A | Discharge status: Home / Self Care | Payer: Medicare Other | Attending: Emergency Medicine | Admitting: Emergency Medicine

## 2016-02-29 ENCOUNTER — Emergency Department (HOSPITAL_COMMUNITY): Payer: Medicare Other

## 2016-02-29 DIAGNOSIS — Z87891 Personal history of nicotine dependence: Secondary | ICD-10-CM | POA: Insufficient documentation

## 2016-02-29 DIAGNOSIS — I10 Essential (primary) hypertension: Secondary | ICD-10-CM | POA: Insufficient documentation

## 2016-02-29 DIAGNOSIS — Z96642 Presence of left artificial hip joint: Secondary | ICD-10-CM | POA: Insufficient documentation

## 2016-02-29 DIAGNOSIS — M25532 Pain in left wrist: Secondary | ICD-10-CM | POA: Insufficient documentation

## 2016-02-29 DIAGNOSIS — Y92009 Unspecified place in unspecified non-institutional (private) residence as the place of occurrence of the external cause: Secondary | ICD-10-CM | POA: Diagnosis not present

## 2016-02-29 DIAGNOSIS — Y939 Activity, unspecified: Secondary | ICD-10-CM | POA: Diagnosis not present

## 2016-02-29 DIAGNOSIS — M25522 Pain in left elbow: Secondary | ICD-10-CM | POA: Diagnosis not present

## 2016-02-29 DIAGNOSIS — E119 Type 2 diabetes mellitus without complications: Secondary | ICD-10-CM | POA: Diagnosis not present

## 2016-02-29 DIAGNOSIS — Z7901 Long term (current) use of anticoagulants: Secondary | ICD-10-CM | POA: Insufficient documentation

## 2016-02-29 DIAGNOSIS — Z79899 Other long term (current) drug therapy: Secondary | ICD-10-CM | POA: Diagnosis not present

## 2016-02-29 DIAGNOSIS — S59902A Unspecified injury of left elbow, initial encounter: Secondary | ICD-10-CM | POA: Diagnosis not present

## 2016-02-29 DIAGNOSIS — Y999 Unspecified external cause status: Secondary | ICD-10-CM | POA: Insufficient documentation

## 2016-02-29 DIAGNOSIS — M7989 Other specified soft tissue disorders: Secondary | ICD-10-CM | POA: Diagnosis not present

## 2016-02-29 NOTE — Discharge Instructions (Signed)
Please keep extremity elevated, ice, ibuprofen as needed for pain. Please follow-up with orthopedic specialist for further evaluation and management. Please return immediately if he experiences any new or worsening signs or symptoms.

## 2016-02-29 NOTE — Telephone Encounter (Signed)
Left message for patient to return call. Please transfer call to [facility]

## 2016-02-29 NOTE — ED Notes (Signed)
The pt is c/o pain in her lt wrist  She fell accidentally and fell onto her lt wrist good radial pulse.  lmp none
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
## 2016-02-29 NOTE — ED Notes (Signed)
Patient transported to X-ray 

## 2016-02-29 NOTE — Telephone Encounter (Signed)
Spoke with Ms Valerie Vazquez and she stated that she was wanting to go to [employer]. She stated that her husband sees him and he will follow all of her pain medication.

## 2016-03-02 ENCOUNTER — Encounter (HOSPITAL_COMMUNITY): Payer: Self-pay

## 2016-03-02 ENCOUNTER — Emergency Department (HOSPITAL_COMMUNITY): Payer: Medicare Other

## 2016-03-02 ENCOUNTER — Emergency Department (HOSPITAL_COMMUNITY)
Admission: EM | Admit: 2016-03-02 | Discharge: 2016-03-02 | Disposition: A | Discharge status: Home / Self Care | Payer: Medicare Other | Attending: Emergency Medicine | Admitting: Emergency Medicine

## 2016-03-02 DIAGNOSIS — M25532 Pain in left wrist: Secondary | ICD-10-CM | POA: Diagnosis not present

## 2016-03-02 DIAGNOSIS — R200 Anesthesia of skin: Secondary | ICD-10-CM | POA: Diagnosis not present

## 2016-03-02 DIAGNOSIS — Z79899 Other long term (current) drug therapy: Secondary | ICD-10-CM | POA: Diagnosis not present

## 2016-03-02 DIAGNOSIS — Z7901 Long term (current) use of anticoagulants: Secondary | ICD-10-CM | POA: Insufficient documentation

## 2016-03-02 DIAGNOSIS — Z87891 Personal history of nicotine dependence: Secondary | ICD-10-CM | POA: Insufficient documentation

## 2016-03-02 DIAGNOSIS — W19XXXD Unspecified fall, subsequent encounter: Secondary | ICD-10-CM | POA: Insufficient documentation

## 2016-03-02 DIAGNOSIS — M79602 Pain in left arm: Secondary | ICD-10-CM | POA: Insufficient documentation

## 2016-03-02 DIAGNOSIS — I10 Essential (primary) hypertension: Secondary | ICD-10-CM | POA: Diagnosis not present

## 2016-03-02 DIAGNOSIS — E119 Type 2 diabetes mellitus without complications: Secondary | ICD-10-CM | POA: Diagnosis not present

## 2016-03-02 DIAGNOSIS — M79642 Pain in left hand: Secondary | ICD-10-CM | POA: Diagnosis not present

## 2016-03-02 DIAGNOSIS — Z96642 Presence of left artificial hip joint: Secondary | ICD-10-CM | POA: Diagnosis not present

## 2016-03-02 DIAGNOSIS — M25522 Pain in left elbow: Secondary | ICD-10-CM | POA: Diagnosis not present

## 2016-03-02 DIAGNOSIS — I252 Old myocardial infarction: Secondary | ICD-10-CM | POA: Diagnosis not present

## 2016-03-02 DIAGNOSIS — M79622 Pain in left upper arm: Secondary | ICD-10-CM | POA: Diagnosis not present

## 2016-03-02 NOTE — ED Provider Notes (Signed)
CSN: [id number]     Arrival date & time 03/02/16  0941 History  By signing my name below, I, Evelene Croon, attest that this documentation has been prepared under the direction and in the presence of non-physician practitioner, Arlean Hopping, PA-C. Electronically Signed: Evelene Croon, Scribe. 03/02/2016. 10:23 AM.     Chief Complaint  Patient presents with  . arm recheck    The history is provided by the patient. No language interpreter was used.    HPI Comments:  Cacey Rusinko is a 60 y.o. female who presents to the Emergency Department complaining of swelling and intermittent numbness to her LUE x 2 days. Pt was seen in the ED about 48 hours prior to her presentation today s/p fall complaining of pain to the left wrist, left shoulder, and left sided rib pain; she had negative XRs of the LUE. She states her pain has moderately improved since her last visit but she became concerned because the swelling has not completely resolved. Patient was given an Ace wrap for the left upper extremity at the previous ED visit. Patient states she is removed and rewrapped this since it was placed. States the numbness in her fingers came on after she rewrapped it. When she removed the Ace wrap, the numbness and tingling resolved quickly. At this time she rates her pain a 4/10 in her LUE. Pt has no other acute complaints or symptoms at this time. She notes she is currently on blood thinners. Pt is left hand dominant.       Past Medical History  Diagnosis Date  . Spinal stenosis   . Hypertension   . Pulmonary embolism ([place]) 04/2001; 09/19/2014    "after gallbladder OR; "  . High cholesterol   . Myocardial infarction ([place]) 10/2010 X 3    "while hospitalized"  . Pneumonia 04/2014  . Sleep apnea     "mild" (09/19/2014)  . Type II diabetes mellitus ([place])     "dx'd in 2007; lost weight; no RX for ~ 4 yr now" (09/19/2014)  . Anemia   . History of blood transfusion 1985    related to hysterectomy  .  Esophageal spasm   . Gastritis   . Gastroenteritis   . IBS (irritable bowel syndrome)   . GERD (gastroesophageal reflux disease)   . Headache     "couple /month lately" (09/19/2014)  . Migraine     "a few times/yr" (09/19/2014)  . Osteoarthritis     "qwhere; mostly around my joints" (09/19/2014)  . Spondylosis   . Chronic back pain     "upper and lower" (09/19/2014)  . Depression     "major" (09/19/2014)  . PTSD (post-traumatic stress disorder)   . Schizoaffective disorder ([place])   . Snoring     a. sleep study 5/16: No OSA  . High blood pressure   . Bilateral renal cysts   . Gallstones   . DVT (deep venous thrombosis) [facility])    Past Surgical History  Procedure Laterality Date  . Excision/release bursa hip Right 12/1984  . Total hip arthroplasty Left 04-06-2014  . Tonsillectomy and adenoidectomy  ~ 1968  . Appendectomy  1985  . Abdominal hysterectomy  1985  . Cholecystectomy  2002  . Hernia repair  1985  . Umbilical hernia repair  1985  . Vena cava filter placement  03/2014  . Back surgery    . Anterior cervical decomp/discectomy fusion  10/2012  . Breast cyst excision Right   . Cardiac catheterization  2000's; 2009  . Left heart catheterization with coronary angiogram N/A 12/09/2014    Procedure: LEFT HEART CATHETERIZATION WITH CORONARY ANGIOGRAM;  Surgeon: Burnell Blanks, MD;  Location: [facility] CATH LAB;  Service: Cardiovascular;  Laterality: N/A;  . Breast excisional biopsy Right YRS AGO    NEG   Family History  Problem Relation Age of Onset  . Heart attack Neg Hx   . Stroke Mother     Deceased  . Lung cancer Father     Deceased  . Healthy Daughter   . Healthy Son     x 2  . Other Son     Suicide   Social History  Substance Use Topics  . Smoking status: Former Smoker -- 1.50 packs/day for 10 years    Types: Cigarettes    Quit date: 04/12/1979  . Smokeless tobacco: Never Used  . Alcohol Use: No   OB History    No data available     Review of Systems   Constitutional: Negative for fever, chills and diaphoresis.  Gastrointestinal: Negative for nausea and vomiting.  Musculoskeletal: Positive for arthralgias.       +Left hand swelling  Neurological: Positive for numbness (resolved).  All other systems reviewed and are negative.   Allergies  Abilify; Augmentin; Bactrim; Ceftin; Coconut oil; Diclofenac; Dye fdc red; Indocin; Linaclotide; Lisinopril; Morphine; Morphine and related; Simvastatin; Sulfa antibiotics; Sulfamethoxazole-trimethoprim; Wellbutrin; and Quetiapine fumarate  Home Medications   Prior to Admission medications   Medication Sig Start Date End Date Taking? Authorizing Provider  apixaban (ELIQUIS) 5 MG TABS tablet Take 1 tablet (5 mg total) by mouth 2 (two) times daily. 11/16/15   [name], MD  BIOTIN PO Take 1 tablet by mouth daily.    Historical Provider, MD  calcium carbonate (TUMS - DOSED IN MG ELEMENTAL CALCIUM) 500 MG chewable tablet Chew 1 tablet by mouth daily as needed for indigestion or heartburn.    Historical Provider, MD  carvedilol (COREG) 3.125 MG tablet Take 1 tablet (3.125 mg total) by mouth 2 (two) times daily with a meal. 10/22/15   Burnell Blanks, MD  clindamycin (CLEOCIN) 150 MG capsule Take 3 capsules (450 mg total) by mouth 3 (three) times daily. 02/02/16   Heather Laisure, PA-C  cyclobenzaprine (FLEXERIL) 10 MG tablet Take 1 tablet (10 mg total) by mouth 3 (three) times daily as needed for muscle spasms. 08/13/15   [name], MD  dicyclomine (BENTYL) 10 MG capsule TAKE 2 CAPSULES BY MOUTH EVERY SIX HOURS 04/09/15   [name], MD  diltiazem (CARDIZEM SR) 60 MG 12 hr capsule Take 1 capsule (60 mg total) by mouth every 6 (six) hours as needed. 02/05/16   Manus Gunning, MD  diphenhydrAMINE (BENADRYL) 25 MG tablet Take 1-2 tablets (25-50 mg total) by mouth every 6 (six) hours as needed for itching ([place]). 07/14/15   Antonietta Breach, PA-C  EPINEPHrine 0.3 mg/0.3 mL IJ SOAJ injection  Inject 0.3 mLs (0.3 mg total) into the muscle once. 10/12/15   [name], MD  escitalopram (LEXAPRO) 10 MG tablet TAKE 1 TABLET BY MOUTH EVERY DAY 10/01/15   [name], MD  furosemide (LASIX) 20 MG tablet TAKE 1 TABLET BY MOUTH EVERY DAY Patient taking differently: TAKE 1 TABLET BY MOUTH EVERY DAY prn 08/14/15   [name], MD  gabapentin (NEURONTIN) 600 MG tablet Take 0.5 tablets (300 mg total) by mouth every 8 (eight) hours. 01/24/16   [name], MD  Iron-Vitamin C (VITRON-C)  65-125 MG TABS Take 1 tablet by mouth daily.    Historical Provider, MD  isosorbide mononitrate (IMDUR) 30 MG 24 hr tablet Take 1 tablet (30 mg total) by mouth daily. 03/22/15   Burnell Blanks, MD  methocarbamol (ROBAXIN) 500 MG tablet Take 1 tablet (500 mg total) by mouth 2 (two) times daily. 02/13/16   Samantha Tripp Dowless, PA-C  Multiple Vitamin (MULTIVITAMIN WITH MINERALS) TABS tablet Take 2 tablets by mouth daily.     Historical Provider, MD  ondansetron (ZOFRAN) 4 MG tablet Take 1 tablet (4 mg total) by mouth every 8 (eight) hours as needed for nausea or vomiting. 08/15/15   [name], MD  oxyCODONE-acetaminophen (PERCOCET/ROXICET) 5-325 MG tablet Take 1 tablet by mouth 2 (two) times daily as needed for severe pain. 02/18/16   [name], MD  pantoprazole (PROTONIX) 40 MG tablet TAKE ONE TABLET BY MOUTH EVERY DAY 09/03/15   [name], MD  potassium chloride (K-DUR) 10 MEQ tablet Take 1 tablet (10 mEq total) by mouth 2 (two) times daily. Patient taking differently: Take 10 mEq by mouth 2 (two) times daily as needed.  10/22/15   Burnell Blanks, MD  traZODone (DESYREL) 50 MG tablet Take 1 tablet (50 mg total) by mouth at bedtime. Patient taking differently: Take 50 mg by mouth at bedtime as needed for sleep.  04/24/15   [name], MD  triamcinolone (NASACORT AQ) 55 MCG/ACT AERO nasal inhaler Place 2 sprays into the nose daily. 10/02/15   Wilhelmina Mcardle, MD   BP  121/82 mmHg  Pulse 107  Temp(Src) 99.2 F (37.3 C)  Resp 18  SpO2 96% Physical Exam  Constitutional: She is oriented to person, place, and time. She appears well-developed and well-nourished. No distress.  HENT:  Head: Normocephalic and atraumatic.  Eyes: Conjunctivae are normal.  Neck: Neck supple.  Cardiovascular: Normal rate, regular rhythm, normal heart sounds and intact distal pulses.   Capillary refill bilaterally less than 2 seconds.  Pulmonary/Chest: Effort normal and breath sounds normal. No respiratory distress.  Abdominal: Soft. There is no tenderness. There is no guarding.  Musculoskeletal: She exhibits no edema or tenderness.  FROM in left hand, wrist and elbow; tenderness to the dorsal wrist. Swelling noted to the left hand and wrist  Point tenderness to the lateral distal left humerus just above the elbow; ROM in shoulder elicits pain in the same area.   Lymphadenopathy:    She has no cervical adenopathy.  Neurological: She is alert and oriented to person, place, and time. She has normal reflexes.  No sensory deficits. Strength 5/5 in all extremities. No gait disturbance. Coordination intact.   Skin: Skin is warm and dry. She is not diaphoretic.  Psychiatric: She has a normal mood and affect. Her behavior is normal.  Nursing note and vitals reviewed.   ED Course  Procedures  DIAGNOSTIC STUDIES:  Oxygen Saturation is 96% on RA, normal by my interpretation.    COORDINATION OF CARE:  10:18 AM Will order repeat XRs of the LUE.   Discussed treatment plan with pt at bedside and pt agreed to plan.   Imaging Review Dg Wrist Complete Left  03/02/2016  CLINICAL DATA:  Numbness EXAM: LEFT WRIST - COMPLETE 3+ VIEW COMPARISON:  02/29/2016 FINDINGS: There is no evidence of fracture or dislocation. There is no evidence of arthropathy or other focal bone abnormality. Soft tissues are unremarkable. IMPRESSION: Negative. Electronically Signed   By: Misty Stanley M.D.   On:  03/02/2016 10:57   Dg Humerus Left  03/02/2016  CLINICAL DATA:  Numbness. EXAM: LEFT HUMERUS - 2+ VIEW COMPARISON:  None. FINDINGS: There is no evidence of fracture or other focal bone lesions. Soft tissues are unremarkable. IMPRESSION: Negative. Electronically Signed   By: Misty Stanley M.D.   On: 03/02/2016 10:58   Dg Hand Complete Left  03/02/2016  CLINICAL DATA:  Swelling intermittent numbness to left upper extremity. EXAM: LEFT HAND - COMPLETE 3+ VIEW COMPARISON:  None. FINDINGS: There is no evidence of fracture or dislocation. There is no evidence of arthropathy or other focal bone abnormality. Soft tissues are unremarkable. IMPRESSION: Negative. Electronically Signed   By: Misty Stanley M.D.   On: 03/02/2016 10:56   I have personally reviewed and evaluated these images as part of my medical decision-making.   EKG Interpretation None         MDM   Final diagnoses:  Pain of left upper extremity    [name] presents for secondary evaluation of left arm pain following a fall 48 hours ago.  Patient has no neuro or functional deficits. Patient was not tachycardic on my exam. Suspect the "numbness and tingling" the patient was feeling was due to the tightness of the Ace wrap. Patient was educated on the proper tightness of the Ace wrap. Repeat imaging performed to assure no previously occult fracture. X-rays free from any abnormalities. Suspect normal soft tissue swelling following the incident patient describes. Patient was reassured and was given a Velcro wrist splint to avoid the over tightening that can come with using an Ace wrap. Patient to follow-up with orthopedics should symptoms fail to resolve. Home care and return precautions discussed. Patient voiced understanding of these instructions and is comfortable with discharge.  Review of the [place] narcotic database reveals the patient regularly receives Percocet 5-325 mg with the patient last receiving 30 tablets on June 19,  prescribed by her PCP.  I personally performed the services described in this documentation, which was scribed in my presence. The recorded information has been reviewed and is accurate.   Lorayne Bender, PA-C 03/04/16 0802  Daleen Bo, MD 03/06/16 1226

## 2016-03-02 NOTE — ED Notes (Signed)
Patient seen last week for arm injury, all negative xrays. Had swelling and numbness to hand yesterday while wearing splint, no numbness now since patient removed splint

## 2016-03-02 NOTE — Discharge Instructions (Signed)
You have been seen today for arm pain. Your imaging showed no abnormalities. A sprain injury is likely. Use the splint and sling as needed for comfort. Take 500 mg of naproxen every 12 hours or 800 mg of ibuprofen every 8 hours for the next 3 days. Take these medications with food to avoid upset stomach. Follow-up with orthopedics should symptoms fail to resolve. Follow up with PCP as needed. Return to ED should symptoms worsen.

## 2016-03-02 NOTE — ED Notes (Signed)
Declined W/C at D/C and was escorted to lobby by RN. 

## 2016-03-03 NOTE — Telephone Encounter (Signed)
Appeal PA has been approved through [facility] on 02/06/16 through 08/31/16. Case # [id number]

## 2016-03-03 NOTE — Telephone Encounter (Signed)
Will forward to [facility] for referral scheduling.

## 2016-03-19 ENCOUNTER — Encounter: Payer: Self-pay | Admitting: Family Medicine

## 2016-03-19 ENCOUNTER — Ambulatory Visit (INDEPENDENT_AMBULATORY_CARE_PROVIDER_SITE_OTHER): Payer: Medicare Other | Admitting: Family Medicine

## 2016-03-19 ENCOUNTER — Ambulatory Visit
Admission: RE | Admit: 2016-03-19 | Discharge: 2016-03-19 | Disposition: A | Discharge status: Home / Self Care | Payer: Medicare Other | Source: Ambulatory Visit | Attending: Family Medicine | Admitting: Family Medicine

## 2016-03-19 ENCOUNTER — Telehealth: Payer: Self-pay | Admitting: Surgical

## 2016-03-19 VITALS — BP 128/86 | HR 91 | Temp 98.4°F | Ht 66.0 in | Wt 203.8 lb

## 2016-03-19 DIAGNOSIS — F418 Other specified anxiety disorders: Secondary | ICD-10-CM | POA: Diagnosis not present

## 2016-03-19 DIAGNOSIS — R202 Paresthesia of skin: Secondary | ICD-10-CM | POA: Diagnosis not present

## 2016-03-19 DIAGNOSIS — I201 Angina pectoris with documented spasm: Secondary | ICD-10-CM

## 2016-03-19 DIAGNOSIS — F32A Depression, unspecified: Secondary | ICD-10-CM

## 2016-03-19 DIAGNOSIS — R251 Tremor, unspecified: Secondary | ICD-10-CM | POA: Insufficient documentation

## 2016-03-19 DIAGNOSIS — R200 Anesthesia of skin: Secondary | ICD-10-CM

## 2016-03-19 DIAGNOSIS — F329 Major depressive disorder, single episode, unspecified: Secondary | ICD-10-CM

## 2016-03-19 DIAGNOSIS — F419 Anxiety disorder, unspecified: Secondary | ICD-10-CM

## 2016-03-19 NOTE — Telephone Encounter (Signed)
Notified patient of MRI results. Patient verbalized understanding

## 2016-03-19 NOTE — Progress Notes (Signed)
[name], MD Phone: [phone]  Valerie Vazquez is a 60 y.o. female who presents today for same-day visit.  Patient has had significant stressors recently. Her significant other died earlier this month. He was found at home dad in his bed. The police evidently have questioned the patient regarding his death. She went on a long explanation of this. Noted that there were cameras at their apartment complex and had audio and visual the please look into. Patient reported she was not anywhere near the apartment complex during the time prior to this. She reports a neighbor saw somebody coming and going with the patient's significant other she was not there and took a picture of the car. Patient reports the police are looking into that car. She also notes there were text messages from her significant other stating that she was going to die with him so she wonders if it was going to be a murder suicide. She then found out that her niece was evidently a fugitive from [employer]. She has been dealing with that as well. She notes she feels depressed at this time. She wonders if it's her time to die as well though she has no thoughts of harming herself or intent or plan to harm herself.  Patient additionally presents for tremors today. Notes she has had tremors for a number of years since prior to moving here from [place]. They've become more frequent. No loss of consciousness with them. No numbness or weakness. They come on and go away on their own. She has seen a neurologist recently though does not remember if she reported this to them. She was supposed to have an MRI of her brain though did not follow through with this.  PMH: Former smoker   ROS see history of present illness  Objective  Physical Exam Filed Vitals:   03/19/16 1336  BP: 128/86  Pulse: 91  Temp: 98.4 F (36.9 C)    BP Readings from Last 3 Encounters:  03/19/16 128/86  03/02/16 121/82  02/29/16 134/77   Wt Readings from  Last 3 Encounters:  03/19/16 203 lb 12.8 oz (92.443 kg)  02/29/16 211 lb 1.6 oz (95.754 kg)  02/18/16 210 lb 3.2 oz (95.346 kg)    Physical Exam  Constitutional: She is well-developed, well-nourished, and in no distress.  HENT:  Head: Normocephalic and atraumatic.  Mouth/Throat: Oropharynx is clear and moist. No oropharyngeal exudate.  Eyes: Conjunctivae are normal. Pupils are equal, round, and reactive to light.  Cardiovascular: Normal rate, regular rhythm and normal heart sounds.   Pulmonary/Chest: Effort normal and breath sounds normal.  Musculoskeletal: She exhibits no edema.  Neurological: She is alert.  Cranial nerves V1 through V3 with diminished sensation on the left, otherwise CN 2-12 intact, 5/5 strength in bilateral biceps, triceps, grip, quads, hamstrings, plantar and dorsiflexion, sensation to light touch slightly decreased in left arm and leg, otherwise sensation to light touch intact in right UE and LE, normal gait, 2+ patellar reflexes, no apparent tremors  Skin: Skin is warm and dry. She is not diaphoretic.  Psychiatric:  Mood depressed, affect flat and tearful     Assessment/Plan: Please see individual problem list.  Anxiety and depression Patient with worsened depression recently. Recently suffered the loss of her significant other to unknown circumstances at this time. Has dealt with a significant amount regarding the initial investigation by the police and her feeling as though she was accused of his death. No intent or plan to harm herself. Discussed counseling  as method of treatment and provided her with the number for hospice of [facility] for grief counseling. Encouraged her to call this number to set this up. Given return precautions.  Numbness Patient reports tremors for a number of years. No obvious tremor today. On neurologic exam she was found to have what would appear to be peripheral numbness in her face given that V1 through V3 all have decreased  sensation. Doubt central lesion as a cause of this. She is also found to have decreased sensation in the left arm and leg. She's had scattered numbness previously. Was evaluated by neurology before and they did not note any sensory deficits on her left side. She was scheduled to have an MRI though did not follow through with this. It would seem odd to have full left-sided numbness of her face arm and leg and a be a stroke. I wonder if this could be a demyelinating issue such as MS. Could be psychosomatic as well given recent psychological stressors. We'll obtain an MRI today to rule out CVA. If MRI is negative we'll refer back to neurology for further evaluation. She is given return precautions.  Tremor No tremor on exam today. We will obtain MRI brain to evaluate her numbness. If negative would refer back to neurology.    Orders Placed This Encounter  Procedures  . MR Brain Wo Contrast    Do not need to hold    Standing Status: Future     Number of Occurrences: 1     Standing Expiration Date: 05/20/2017    Order Specific Question:  Reason for Exam (SYMPTOM  OR DIAGNOSIS REQUIRED)    Answer:  left sided forehead, cheek, jaw, arm, and leg numbness    Order Specific Question:  Preferred imaging location?    Answer:  [facility] (table limit-300lbs)    Order Specific Question:  What is the patient's sedation requirement?    Answer:  No Sedation    Order Specific Question:  Does the patient have a pacemaker or implanted devices?    Answer:  No    Order Specific Question:  Call Results- Best Contact Number?    Answer:  [place], [place]

## 2016-03-19 NOTE — Telephone Encounter (Signed)
MRI returned normal. Please contact the patient to let her know like her to follow-up with her neurologist to discuss her symptoms.

## 2016-03-19 NOTE — Assessment & Plan Note (Addendum)
Patient with worsened depression recently. Recently suffered the loss of her significant other to unknown circumstances at this time. Has dealt with a significant amount regarding the initial investigation by the police and her feeling as though she was accused of his death. No intent or plan to harm herself. Discussed counseling as method of treatment and provided her with the number for hospice of [facility] for grief counseling. Encouraged her to call this number to set this up. Given return precautions.

## 2016-03-19 NOTE — Patient Instructions (Signed)
Nice to see you. We are going to work on getting you in touch with hospice for counseling. We are going to obtain an MRI of your head today to evaluate for a cause of your numbness. If you develop weakness, worsening numbness, intent or plan to harm herself, vision changes, or any new or changing symptoms please seek medical attention.

## 2016-03-19 NOTE — Assessment & Plan Note (Addendum)
Patient reports tremors for a number of years. No obvious tremor today. On neurologic exam she was found to have what would appear to be peripheral numbness in her face given that V1 through V3 all have decreased sensation. Doubt central lesion as a cause of this. She is also found to have decreased sensation in the left arm and leg. She's had scattered numbness previously. Was evaluated by neurology before and they did not note any sensory deficits on her left side. She was scheduled to have an MRI though did not follow through with this. It would seem odd to have full left-sided numbness of her face arm and leg and a be a stroke. I wonder if this could be a demyelinating issue such as MS. Could be psychosomatic as well given recent psychological stressors. We'll obtain an MRI today to rule out CVA. If MRI is negative we'll refer back to neurology for further evaluation. She is given return precautions.

## 2016-03-19 NOTE — Assessment & Plan Note (Signed)
No tremor on exam today. We will obtain MRI brain to evaluate her numbness. If negative would refer back to neurology.

## 2016-03-19 NOTE — Progress Notes (Signed)
Pre visit review using our clinic review tool, if applicable. No additional management support is needed unless otherwise documented below in the visit note. 

## 2016-03-26 ENCOUNTER — Encounter (HOSPITAL_COMMUNITY): Payer: Self-pay | Admitting: Emergency Medicine

## 2016-03-26 ENCOUNTER — Emergency Department (HOSPITAL_COMMUNITY)
Admission: EM | Admit: 2016-03-26 | Discharge: 2016-03-26 | Disposition: A | Discharge status: Home / Self Care | Payer: Medicare Other | Attending: Emergency Medicine | Admitting: Emergency Medicine

## 2016-03-26 ENCOUNTER — Emergency Department (HOSPITAL_COMMUNITY): Payer: Medicare Other

## 2016-03-26 ENCOUNTER — Other Ambulatory Visit: Payer: Self-pay | Admitting: Family Medicine

## 2016-03-26 DIAGNOSIS — E119 Type 2 diabetes mellitus without complications: Secondary | ICD-10-CM | POA: Diagnosis not present

## 2016-03-26 DIAGNOSIS — I10 Essential (primary) hypertension: Secondary | ICD-10-CM | POA: Insufficient documentation

## 2016-03-26 DIAGNOSIS — R0789 Other chest pain: Secondary | ICD-10-CM | POA: Diagnosis not present

## 2016-03-26 DIAGNOSIS — I252 Old myocardial infarction: Secondary | ICD-10-CM | POA: Diagnosis not present

## 2016-03-26 DIAGNOSIS — Z96642 Presence of left artificial hip joint: Secondary | ICD-10-CM | POA: Insufficient documentation

## 2016-03-26 DIAGNOSIS — Z87891 Personal history of nicotine dependence: Secondary | ICD-10-CM | POA: Insufficient documentation

## 2016-03-26 DIAGNOSIS — Z7901 Long term (current) use of anticoagulants: Secondary | ICD-10-CM | POA: Insufficient documentation

## 2016-03-26 DIAGNOSIS — R079 Chest pain, unspecified: Secondary | ICD-10-CM

## 2016-03-26 LAB — CBC
HCT: 34.7 % — ABNORMAL LOW (ref 36.0–46.0)
Hemoglobin: 11 g/dL — ABNORMAL LOW (ref 12.0–15.0)
MCH: 28.9 pg (ref 26.0–34.0)
MCHC: 31.7 g/dL (ref 30.0–36.0)
MCV: 91.3 fL (ref 78.0–100.0)
Platelets: 355 10*3/uL (ref 150–400)
RBC: 3.8 MIL/uL — ABNORMAL LOW (ref 3.87–5.11)
RDW: 14.1 % (ref 11.5–15.5)
WBC: 7.3 10*3/uL (ref 4.0–10.5)

## 2016-03-26 LAB — I-STAT TROPONIN, ED
Troponin i, poc: 0 ng/mL (ref 0.00–0.08)
Troponin i, poc: 0 ng/mL (ref 0.00–0.08)

## 2016-03-26 LAB — BASIC METABOLIC PANEL
Anion gap: 7 (ref 5–15)
BUN: 7 mg/dL (ref 6–20)
CO2: 30 mmol/L (ref 22–32)
Calcium: 9.6 mg/dL (ref 8.9–10.3)
Chloride: 103 mmol/L (ref 101–111)
Creatinine, Ser: 0.77 mg/dL (ref 0.44–1.00)
GFR calc Af Amer: >60 mL/min (ref >60)
GFR calc non Af Amer: >60 mL/min (ref >60)
Glucose, Bld: 135 mg/dL — ABNORMAL HIGH (ref 65–99)
Potassium: 4.4 mmol/L (ref 3.5–5.1)
Sodium: 140 mmol/L (ref 135–145)

## 2016-03-26 LAB — PROTIME-INR
INR: 1.21
Prothrombin Time: 15.4 seconds — ABNORMAL HIGH (ref 11.4–15.2)

## 2016-03-26 NOTE — ED Triage Notes (Signed)
Patient reports upper chest pain " burning / dull" radiating to left arm with mild SOB , denies nausea or diaphoresis .

## 2016-03-26 NOTE — ED Notes (Signed)
D/C instructions reviewed. Pt. Verbalizes understanding of follow up appointment and requests to be wheeled out. She denies pain and questions

## 2016-03-26 NOTE — ED Provider Notes (Signed)
[facility] DEPT Provider Note   CSN: [id number] Arrival date & time: 03/26/16  [id number]  First Provider Contact:  First MD Initiated Contact with Patient 03/26/16 [phone]        History   Chief Complaint Chief Complaint  Patient presents with  . Chest Pain    HPI Valerie Vazquez is a 60 y.o. 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 Patient reports on Monday night at approximately 9 PM she experienced a burning sensation on "the skin of my chest". At that time, pain did not radiate. She reports having chili that night for dinner. Discomfort has been waxing and waning since Monday, most recently at 5:00 this morning when patient drove herself to the emergency department.  She reports yesterday evening and this morning the discomfort radiated into her left neck and left arm. She denies any overt shortness of breath. She does report associated intermittent nausea that is fleeting and resolve spontaneously. No emesis, numbness or weakness, PND or orthopnea, back pain, thoracic component, cough or hemoptysis, leg swelling. Patient states she thinks her symptoms may be due to stress as she just lost her fiance on July 5. She did not try anything to improve her symptoms, nothing makes the problem better or worse.  HPI  Past Medical History:  Diagnosis Date  . Anemia   . Bilateral renal cysts   . Chronic back pain    "upper and lower" (09/19/2014)  . Depression    "major" (09/19/2014)  . DVT (deep venous thrombosis) ([place])   . Esophageal spasm   . Gallstones   . Gastritis   . Gastroenteritis   . GERD (gastroesophageal reflux disease)   . Headache    "couple /month lately" (09/19/2014)  . High blood pressure   . High cholesterol   . History of blood transfusion 1985   related to hysterectomy  . Hypertension   . IBS (irritable bowel syndrome)   . Migraine    "a few times/yr" (09/19/2014)  .  Myocardial infarction ([place]) 10/2010 X 3   "while hospitalized"  . Osteoarthritis    "qwhere; mostly around my joints" (09/19/2014)  . Pneumonia 04/2014  . PTSD (post-traumatic stress disorder)   . Pulmonary embolism ([place]) 04/2001; 09/19/2014   "after gallbladder OR; "  . Schizoaffective disorder ([place])   . Sleep apnea    "mild" (09/19/2014)  . Snoring    a. sleep study 5/16: No OSA  . Spinal stenosis   . Spondylosis   . Type II diabetes mellitus ([place])    "dx'd in 2007; lost weight; no RX for ~ 4 yr now" (09/19/2014)    Patient Active Problem List   Diagnosis Date Noted  . Tremor 03/19/2016  . Left shoulder pain 02/18/2016  . Abdominal distension 02/18/2016  . Absolute anemia 01/29/2016  . Chronic pain 01/29/2016  . Neurogenic pain 01/29/2016  . Musculoskeletal pain 01/29/2016  . Long term current use of opiate analgesic 01/29/2016  . Long term prescription opiate use 01/29/2016  . Opiate use (15 MME/Day) 01/29/2016  . Encounter for therapeutic drug level monitoring 01/29/2016  . Encounter for pain management planning 01/29/2016  . Chronic neck pain (midline) 01/29/2016  . Chronic chest wall pain 01/29/2016  . Chronic low back pain (Location of Secondary source of pain) (Bilateral) (R>L) 01/29/2016  . Disturbance of skin sensation 01/29/2016  . Chronic shoulder pain (Location of Primary Source of Pain) (  Left) 01/29/2016  . Chronic hip pain (Location of Tertiary source of pain) (Bilateral) (R>L) 01/29/2016  . Osteoarthritis of hip (Bilateral) 01/29/2016  . Lumbar facet arthropathy (Severe at L2-3, L3-4, L4-5, and L5-S1) 01/29/2016  . Lumbar central spinal stenosis (moderate at L4-5; mild at L2-3 and L3-4) 01/29/2016  . Lumbar foraminal stenosis (L4-5) (Right) 01/29/2016  . Lumbar facet syndrome 01/29/2016  . Hx of cervical spine surgery 01/29/2016  . Numbness 11/19/2015  . Right wrist pain 11/09/2015  . Breast pain 10/19/2015  . Chest pain 10/14/2015  . Headache 10/14/2015  .  Abdominal pain 10/14/2015  . Depression 10/14/2015  . Lip swelling 10/14/2015  . Pulmonary embolism ([place]) 08/15/2015  . Bilateral pneumonia 08/15/2015  . Nausea 08/15/2015  . Shortness of breath 07/31/2015  . Pleural effusion, bilateral 07/31/2015  . Cough 07/31/2015  . Chronic pain of multiple sites 06/05/2015  . Chronic lumbosacral pain 06/05/2015  . Esophageal spasm 06/05/2015  . History of DVT (deep vein thrombosis) 02/25/2015  . 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 Constipation 12/12/2014  . Coronary vasospasm ([place]) 12/09/2014  . History of pulmonary embolus (PE) 09/19/2014  . Impingement syndrome of shoulder 08/30/2014  . Impingement syndrome of left shoulder 08/30/2014  . Injury of superior glenoid labrum of shoulder joint 08/16/2014  . Supraspinatus tenosynovitis 08/16/2014  . Cervical spondylosis without myelopathy 08/16/2014  . Anxiety and depression 04/13/2014  . Osteoarthritis of hip (Left) 04/11/2014  . S/P THR (total hip replacement) (Left) 04/11/2014  . IBS (irritable bowel syndrome) 04/11/2014  . GERD (gastroesophageal reflux disease) 04/11/2014  . Insomnia 04/11/2014  . Essential hypertension, benign 10/28/2013  . Left hip pain 10/28/2013  . Anemia 10/28/2013  . HLD (hyperlipidemia) 10/28/2013    Past Surgical History:  Procedure Laterality Date  . ABDOMINAL HYSTERECTOMY  1985  . ANTERIOR CERVICAL DECOMP/DISCECTOMY FUSION  10/2012  . APPENDECTOMY  1985  . BACK SURGERY    . BREAST CYST EXCISION Right   . BREAST EXCISIONAL BIOPSY Right YRS AGO   NEG  . CARDIAC CATHETERIZATION   2000's; 2009  . CHOLECYSTECTOMY  2002  . EXCISION/RELEASE BURSA HIP Right 12/1984  . HERNIA REPAIR  1985  . LEFT HEART CATHETERIZATION WITH CORONARY ANGIOGRAM N/A 12/09/2014   Procedure: LEFT HEART CATHETERIZATION WITH CORONARY ANGIOGRAM;  Surgeon: Burnell Blanks, MD;  Location: [facility] CATH LAB;  Service: Cardiovascular;  Laterality: N/A;  . TONSILLECTOMY AND ADENOIDECTOMY  ~ 1968  .  TOTAL HIP ARTHROPLASTY Left 04-06-2014  . UMBILICAL HERNIA REPAIR  1985  . VENA CAVA FILTER PLACEMENT  03/2014    OB History    No data available       Home Medications    Prior to Admission medications   Medication Sig Start Date End Date Taking? Authorizing Provider  apixaban (ELIQUIS) 5 MG TABS tablet Take 1 tablet (5 mg total) by mouth 2 (two) times daily. 11/16/15  Yes [name], MD  carvedilol (COREG) 3.125 MG tablet Take 1 tablet (3.125 mg total) by mouth 2 (two) times daily with a meal. 10/22/15  Yes Burnell Blanks, MD  cyclobenzaprine (FLEXERIL) 10 MG tablet Take 1 tablet (10 mg total) by mouth 3 (three) times daily as needed for muscle spasms. 08/13/15  Yes [name], MD  dicyclomine (BENTYL) 10 MG capsule TAKE 2 CAPSULES BY MOUTH EVERY SIX HOURS Patient taking differently: TAKE 1 CAPSULES BY MOUTH THREE TIMES DAILY 04/09/15  Yes [name], MD  diltiazem (CARDIZEM CD) 120 MG 24 hr capsule Take 120  mg by mouth daily. 12/26/15  Yes Historical Provider, MD  diphenhydrAMINE (BENADRYL) 25 MG tablet Take 1-2 tablets (25-50 mg total) by mouth every 6 (six) hours as needed for itching ([place]). 07/14/15  Yes Antonietta Breach, PA-C  escitalopram (LEXAPRO) 10 MG tablet TAKE 1 TABLET BY MOUTH EVERY DAY 10/01/15  Yes [name], MD  furosemide (LASIX) 20 MG tablet TAKE 1 TABLET BY MOUTH EVERY DAY Patient taking differently: TAKE 1 TABLET BY MOUTH EVERY DAY prn 08/14/15  Yes [name], MD  gabapentin (NEURONTIN) 600 MG tablet Take 0.5 tablets (300 mg total) by mouth every 8 (eight) hours. 01/24/16  Yes [name], MD  isosorbide mononitrate (IMDUR) 30 MG 24 hr tablet Take 1 tablet (30 mg total) by mouth daily. 03/22/15  Yes Burnell Blanks, MD  Multiple Vitamin (MULTIVITAMIN WITH MINERALS) TABS tablet Take 2 tablets by mouth daily.    Yes Historical Provider, MD  ondansetron (ZOFRAN) 4 MG tablet Take 1 tablet (4 mg total) by mouth every 8 (eight) hours as  needed for nausea or vomiting. 08/15/15  Yes [name], MD  oxyCODONE-acetaminophen (PERCOCET/ROXICET) 5-325 MG tablet Take 1 tablet by mouth 2 (two) times daily as needed for severe pain. 02/18/16  Yes [name], MD  pantoprazole (PROTONIX) 40 MG tablet TAKE ONE TABLET BY MOUTH EVERY DAY 09/03/15  Yes [name], MD  potassium chloride (K-DUR) 10 MEQ tablet Take 1 tablet (10 mEq total) by mouth 2 (two) times daily. Patient taking differently: Take 10 mEq by mouth 2 (two) times daily as needed.  10/22/15  Yes Burnell Blanks, MD  calcium carbonate (TUMS - DOSED IN MG ELEMENTAL CALCIUM) 500 MG chewable tablet Chew 1 tablet by mouth daily as needed for indigestion or heartburn.    Historical Provider, MD  diltiazem (CARDIZEM SR) 60 MG 12 hr capsule Take 1 capsule (60 mg total) by mouth every 6 (six) hours as needed. Patient not taking: Reported on 03/26/2016 02/05/16   Manus Gunning, MD  EPINEPHrine 0.3 mg/0.3 mL IJ SOAJ injection Inject 0.3 mLs (0.3 mg total) into the muscle once. 10/12/15   [name], MD  traZODone (DESYREL) 50 MG tablet Take 1 tablet (50 mg total) by mouth at bedtime. Patient taking differently: Take 50 mg by mouth at bedtime as needed for sleep.  04/24/15   [name], MD  triamcinolone (NASACORT AQ) 55 MCG/ACT AERO nasal inhaler Place 2 sprays into the nose daily. 10/02/15   Wilhelmina Mcardle, MD    Family History Family History  Problem Relation Age of Onset  . Stroke Mother     Deceased  . Lung cancer Father     Deceased  . Healthy Daughter   . Healthy Son     x 2  . Other Son     Suicide  . Heart attack Neg Hx     Social History Social History  Substance Use Topics  . Smoking status: Former Smoker    Packs/day: 1.50    Years: 10.00    Types: Cigarettes    Quit date: 04/12/1979  . Smokeless tobacco: Never Used  . Alcohol use No     Allergies   Abilify [aripiprazole]; Augmentin [amoxicillin-pot clavulanate]; Bactrim  [sulfamethoxazole-trimethoprim]; Ceftin [cefuroxime axetil]; Coconut oil; Diclofenac; Dye fdc red [red dye]; Indocin [indomethacin]; Linaclotide; Lisinopril; Morphine; Morphine and related; Simvastatin; Sulfa antibiotics; Sulfamethoxazole-trimethoprim; Wellbutrin [bupropion]; and Quetiapine fumarate   Review of Systems Review of Systems A 10 point review of systems was  completed and was negative except for pertinent positives and negatives as mentioned in the history of present illness    Physical Exam Updated Vital Signs BP 127/70   Pulse 91   Temp 98.9 F (37.2 C) (Oral)   Resp 15   Ht 5\' 6"  (1.676 m)   Wt 92.1 kg   SpO2 96%   BMI 32.77 kg/m   Physical Exam  Constitutional: She appears well-developed. No distress.  Awake, alert and nontoxic in appearance  HENT:  Head: Normocephalic and atraumatic.  Right Ear: External ear normal.  Left Ear: External ear normal.  Mouth/Throat: Oropharynx is clear and moist.  Eyes: Conjunctivae and EOM are normal. Pupils are equal, round, and reactive to light.  Neck: Normal range of motion. No JVD present.  Cardiovascular: Normal rate, regular rhythm and normal heart sounds.   Pulmonary/Chest: Effort normal and breath sounds normal. No stridor.  Abdominal: Soft. There is no tenderness.  Musculoskeletal: Normal range of motion.  Neurological:  Awake, alert, cooperative and aware of situation; motor strength bilaterally; sensation normal to light touch bilaterally; no facial asymmetry; tongue midline; major cranial nerves appear intact;  baseline gait without new ataxia.  Skin: No rash noted. She is not diaphoretic.  Psychiatric: She has a normal mood and affect. Her behavior is normal. Thought content normal.  Nursing note and vitals reviewed.    ED Treatments / Results  Labs (all labs ordered are listed, but only abnormal results are displayed) Labs Reviewed  BASIC METABOLIC PANEL - Abnormal; Notable for the following:       Result  Value   Glucose, Bld 135 (*)    All other components within normal limits  CBC - Abnormal; Notable for the following:    RBC 3.80 (*)    Hemoglobin 11.0 (*)    HCT 34.7 (*)    All other components within normal limits  PROTIME-INR - Abnormal; Notable for the following:    Prothrombin Time 15.4 (*)    All other components within normal limits  I-STAT TROPOININ, ED  I-STAT TROPOININ, ED    EKG  EKG Interpretation  Date/Time:  Wednesday March 26 2016 [id number] EDT Ventricular Rate:  91 PR Interval:  158 QRS Duration: 86 QT Interval:  376 QTC Calculation: 462 R Axis:   23 Text Interpretation:  Normal sinus rhythm Non-specific ST-t changes No significant change since last tracing Confirmed by Wilson Singer  MD, STEPHEN ([id number]) on 03/26/2016 8:06:07 AM       Radiology Dg Chest 2 View  Result Date: 03/26/2016 CLINICAL DATA:  60 year old female with chest pain and shortness of breath EXAM: CHEST  2 VIEW COMPARISON:  Chest radiograph dated 01/09/2016 FINDINGS: Two views of the chest demonstrate clear lungs. There is no pleural effusion or pneumothorax. The cardiac silhouette is within normal limits. Lower cervical fixation plate and screws. No acute osseous pathology. IMPRESSION: No active cardiopulmonary disease. Electronically Signed   By: Anner Crete M.D.   On: 03/26/2016 06:55   Procedures Procedures (including critical care time)  Medications Ordered in ED Medications - No data to display   Initial Impression / Assessment and Plan / ED Course  I have reviewed the triage vital signs and the nursing notes.  Pertinent labs & imaging results that were available during my care of the patient were reviewed by me and considered in my medical decision making (see chart for details).  Clinical Course   Vitals:   03/26/16 0610 03/26/16 0745 03/26/16 0815 03/26/16 1000  BP: 142/84 122/70 130/77 127/70  Pulse: 92 82 82 91  Resp: 16 20 17 15   Temp: 98.9 F (37.2 C)     TempSrc: Oral      SpO2: 98% 96% 97% 96%  Weight: 92.1 kg     Height: 5\' 6"  (1.676 m)        Patient presents with anterior chest burning ongoing over the past 2 days. Left heart cath done in April 2016 shows no coronary artery disease. History of PE on apixaban and IVC filter, low suspicion for recurrent PE. No hypoxia, tachypnea or tachycardia. EKG is reassuring, initial troponin negative, labs reassuring, chest x-ray shows no acute cardiopulmonary pathology. Plan for delta troponin, have patient follow-up with cardiology. States her cardiologist is Dr. Angelena Form and she will be able to call for reevaluation this week. Upon reevaluation 10:15, patient is sleeping comfortably in exam bed in no apparent distress Prior to patient discharge, I discussed and reviewed this case with Dr.Kohut  Final Clinical Impressions(s) / ED Diagnoses   Final diagnoses:  Nonspecific chest pain    New Prescriptions New Prescriptions   No medications on file     Comer Locket, PA-C 03/26/16 [place], MD 03/27/16 1610

## 2016-03-26 NOTE — Discharge Instructions (Signed)
There does not appear to be an emergent cause for your symptoms at this time. Your exam, labs, EKG, chest x-ray were all reassuring. It is important for you to follow-up with your cardiologist , please call in the next 1-2 days for reevaluation.return to ED for any new or worsening symptoms as we discussed.

## 2016-03-26 NOTE — ED Notes (Signed)
Wheeled pt back to room from waiting room. 

## 2016-03-26 NOTE — ED Notes (Signed)
BEN pa at beside.

## 2016-03-27 ENCOUNTER — Encounter: Payer: Self-pay | Admitting: Family Medicine

## 2016-03-27 ENCOUNTER — Encounter: Payer: Self-pay | Admitting: Oncology

## 2016-04-01 ENCOUNTER — Encounter: Payer: Self-pay | Admitting: Family

## 2016-04-01 ENCOUNTER — Ambulatory Visit (INDEPENDENT_AMBULATORY_CARE_PROVIDER_SITE_OTHER): Payer: Medicare Other | Admitting: Family

## 2016-04-01 VITALS — BP 110/62 | HR 89 | Ht 66.0 in | Wt 208.0 lb

## 2016-04-01 DIAGNOSIS — R1011 Right upper quadrant pain: Secondary | ICD-10-CM | POA: Diagnosis not present

## 2016-04-01 DIAGNOSIS — M2550 Pain in unspecified joint: Secondary | ICD-10-CM

## 2016-04-01 DIAGNOSIS — I201 Angina pectoris with documented spasm: Secondary | ICD-10-CM

## 2016-04-01 LAB — COMPREHENSIVE METABOLIC PANEL
ALT: 12 U/L (ref 0–35)
AST: 15 U/L (ref 0–37)
Albumin: 3.9 g/dL (ref 3.5–5.2)
Alkaline Phosphatase: 104 U/L (ref 39–117)
BUN: 9 mg/dL (ref 6–23)
CO2: 31 mEq/L (ref 19–32)
Calcium: 9.5 mg/dL (ref 8.4–10.5)
Chloride: 103 mEq/L (ref 96–112)
Creatinine, Ser: 0.84 mg/dL (ref 0.40–1.20)
GFR: 88.36 mL/min (ref >60.00)
Glucose, Bld: 129 mg/dL — ABNORMAL HIGH (ref 70–99)
Potassium: 4 mEq/L (ref 3.5–5.1)
Sodium: 140 mEq/L (ref 135–145)
Total Bilirubin: 0.3 mg/dL (ref 0.2–1.2)
Total Protein: 7.4 g/dL (ref 6.0–8.3)

## 2016-04-01 LAB — SEDIMENTATION RATE: Sed Rate: 23 mm/hr (ref 0–30)

## 2016-04-01 LAB — CBC WITH DIFFERENTIAL/PLATELET
Basophils Absolute: 0 10*3/uL (ref 0.0–0.1)
Basophils Relative: 0.4 % (ref 0.0–3.0)
Eosinophils Absolute: 0.3 10*3/uL (ref 0.0–0.7)
Eosinophils Relative: 4.9 % (ref 0.0–5.0)
HCT: 34.1 % — ABNORMAL LOW (ref 36.0–46.0)
Hemoglobin: 11.3 g/dL — ABNORMAL LOW (ref 12.0–15.0)
Lymphocytes Relative: 34.7 % (ref 12.0–46.0)
Lymphs Abs: 2.3 10*3/uL (ref 0.7–4.0)
MCHC: 33.1 g/dL (ref 30.0–36.0)
MCV: 89.4 fl (ref 78.0–100.0)
Monocytes Absolute: 0.4 10*3/uL (ref 0.1–1.0)
Monocytes Relative: 5.8 % (ref 3.0–12.0)
Neutro Abs: 3.6 10*3/uL (ref 1.4–7.7)
Neutrophils Relative %: 54.2 % (ref 43.0–77.0)
Platelets: 349 10*3/uL (ref 150.0–400.0)
RBC: 3.81 Mil/uL — ABNORMAL LOW (ref 3.87–5.11)
RDW: 14.9 % (ref 11.5–15.5)
WBC: 6.7 10*3/uL (ref 4.0–10.5)

## 2016-04-01 LAB — C-REACTIVE PROTEIN: CRP: 2 mg/dL (ref 0.5–20.0)

## 2016-04-01 NOTE — Patient Instructions (Signed)
Pleasure meeting you. We will do lab work today and we'll call you with all the results.  At this time right upper quadrant pain is a bit nonspecific however if it increases or worsens in intensity, please get emergency room.  If there is no improvement in your symptoms, or if there is any worsening of symptoms, or if you have any additional concerns, please return for re-evaluation; or, if we are closed, consider going to the Emergency Room for evaluation if symptoms urgent.

## 2016-04-01 NOTE — Progress Notes (Signed)
Subjective:    Patient ID: Valerie Vazquez, female    DOB: 1955-11-13, 60 y.o.   MRN: [id number]  CC: Valerie Vazquez is a 60 y.o. female who presents today for an acute visit.    HPI: Patient here for multiple compliants. She wanted to review the lab work done in the emergency room showed elevated sedimentation rate and CRP. She endorses pain and swelling and wrist and hands,states occasionally for the past couple years. No family h/o RA or SLE.  She also has concern for right upper quadrant pain. She states that her husband had liver cancer and she would like for hepatitis lab work done.Intermittent RUQ pain for couple of months and concern for liver cancer as fiance died from liver cancer. No identifiable triggers. Unrelated to food. No alleviating factors.  Dull ache.  Endorses nausea this morning.  No vomiting, fever, chills, rash, dysuria. Fiance passed away one month ago. She also endorses IBS-constipation, last BM 5 days ago. Passing gas. Follows with GI for IBS.  H/o cholecystectomy.        HISTORY:  Past Medical History:  Diagnosis Date  . Anemia   . Bilateral renal cysts   . Chronic back pain    "upper and lower" (09/19/2014)  . Depression    "major" (09/19/2014)  . DVT (deep venous thrombosis) ([place])   . Esophageal spasm   . Gallstones   . Gastritis   . Gastroenteritis   . GERD (gastroesophageal reflux disease)   . Headache    "couple /month lately" (09/19/2014)  . High blood pressure   . High cholesterol   . History of blood transfusion 1985   related to hysterectomy  . Hypertension   . IBS (irritable bowel syndrome)   . Migraine    "a few times/yr" (09/19/2014)  . Myocardial infarction ([place]) 10/2010 X 3   "while hospitalized"  . Osteoarthritis    "qwhere; mostly around my joints" (09/19/2014)  . Pneumonia 04/2014  . PTSD (post-traumatic stress disorder)   . Pulmonary embolism ([place]) 04/2001; 09/19/2014   "after gallbladder OR; "  . Schizoaffective  disorder ([place])   . Sleep apnea    "mild" (09/19/2014)  . Snoring    a. sleep study 5/16: No OSA  . Spinal stenosis   . Spondylosis   . Type II diabetes mellitus ([place])    "dx'd in 2007; lost weight; no RX for ~ 4 yr now" (09/19/2014)   Past Surgical History:  Procedure Laterality Date  . ABDOMINAL HYSTERECTOMY  1985  . ANTERIOR CERVICAL DECOMP/DISCECTOMY FUSION  10/2012  . APPENDECTOMY  1985  . BACK SURGERY    . BREAST CYST EXCISION Right   . BREAST EXCISIONAL BIOPSY Right YRS AGO   NEG  . CARDIAC CATHETERIZATION   2000's; 2009  . CHOLECYSTECTOMY  2002  . EXCISION/RELEASE BURSA HIP Right 12/1984  . HERNIA REPAIR  1985  . LEFT HEART CATHETERIZATION WITH CORONARY ANGIOGRAM N/A 12/09/2014   Procedure: LEFT HEART CATHETERIZATION WITH CORONARY ANGIOGRAM;  Surgeon: Burnell Blanks, MD;  Location: [facility] CATH LAB;  Service: Cardiovascular;  Laterality: N/A;  . TONSILLECTOMY AND ADENOIDECTOMY  ~ 1968  . TOTAL HIP ARTHROPLASTY Left 04-06-2014  . UMBILICAL HERNIA REPAIR  1985  . VENA CAVA FILTER PLACEMENT  03/2014   Family History  Problem Relation Age of Onset  . Stroke Mother     Deceased  . Lung cancer Father     Deceased  . Healthy Daughter   . Healthy  Son     x 2  . Other Son     Suicide  . Heart attack Neg Hx     Allergies: Abilify [aripiprazole]; Augmentin [amoxicillin-pot clavulanate]; Bactrim [sulfamethoxazole-trimethoprim]; Ceftin [cefuroxime axetil]; Coconut oil; Diclofenac; Dye fdc red [red dye]; Indocin [indomethacin]; Linaclotide; Lisinopril; Morphine; Morphine and related; Simvastatin; Sulfa antibiotics; Sulfamethoxazole-trimethoprim; Wellbutrin [bupropion]; and Quetiapine fumarate Current Outpatient Prescriptions on File Prior to Visit  Medication Sig Dispense Refill  . calcium carbonate (TUMS - DOSED IN MG ELEMENTAL CALCIUM) 500 MG chewable tablet Chew 1 tablet by mouth daily as needed for indigestion or heartburn.    . carvedilol (COREG) 3.125 MG tablet Take 1  tablet (3.125 mg total) by mouth 2 (two) times daily with a meal. 60 tablet 11  . cyclobenzaprine (FLEXERIL) 10 MG tablet Take 1 tablet (10 mg total) by mouth 3 (three) times daily as needed for muscle spasms. 100 tablet 5  . dicyclomine (BENTYL) 10 MG capsule TAKE 2 CAPSULES BY MOUTH EVERY SIX HOURS (Patient taking differently: TAKE 1 CAPSULES BY MOUTH THREE TIMES DAILY) 240 capsule 5  . diltiazem (CARDIZEM CD) 120 MG 24 hr capsule Take 120 mg by mouth daily.    Marland Kitchen diltiazem (CARDIZEM SR) 60 MG 12 hr capsule Take 1 capsule (60 mg total) by mouth every 6 (six) hours as needed. 120 capsule 3  . diphenhydrAMINE (BENADRYL) 25 MG tablet Take 1-2 tablets (25-50 mg total) by mouth every 6 (six) hours as needed for itching (Rash). 30 tablet 0  . ELIQUIS 5 MG TABS tablet TAKE 1 TABLET (5 MG TOTAL) BY MOUTH 2 (TWO) TIMES DAILY. 60 tablet 2  . EPINEPHrine 0.3 mg/0.3 mL IJ SOAJ injection Inject 0.3 mLs (0.3 mg total) into the muscle once. 1 Device 0  . escitalopram (LEXAPRO) 10 MG tablet TAKE 1 TABLET BY MOUTH EVERY DAY 30 tablet 5  . furosemide (LASIX) 20 MG tablet TAKE 1 TABLET BY MOUTH EVERY DAY (Patient taking differently: TAKE 1 TABLET BY MOUTH EVERY DAY prn) 90 tablet 2  . gabapentin (NEURONTIN) 600 MG tablet Take 0.5 tablets (300 mg total) by mouth every 8 (eight) hours. 90 tablet 5  . isosorbide mononitrate (IMDUR) 30 MG 24 hr tablet Take 1 tablet (30 mg total) by mouth daily. 30 tablet 11  . Multiple Vitamin (MULTIVITAMIN WITH MINERALS) TABS tablet Take 2 tablets by mouth daily.     . ondansetron (ZOFRAN) 4 MG tablet Take 1 tablet (4 mg total) by mouth every 8 (eight) hours as needed for nausea or vomiting. 90 tablet 5  . oxyCODONE-acetaminophen (PERCOCET/ROXICET) 5-325 MG tablet Take 1 tablet by mouth 2 (two) times daily as needed for severe pain. 30 tablet 0  . pantoprazole (PROTONIX) 40 MG tablet TAKE ONE TABLET BY MOUTH EVERY DAY 30 tablet 5  . potassium chloride (K-DUR) 10 MEQ tablet Take 1 tablet  (10 mEq total) by mouth 2 (two) times daily. (Patient taking differently: Take 10 mEq by mouth 2 (two) times daily as needed. ) 60 tablet 11  . traZODone (DESYREL) 50 MG tablet Take 1 tablet (50 mg total) by mouth at bedtime. (Patient taking differently: Take 50 mg by mouth at bedtime as needed for sleep. ) 30 tablet 5  . triamcinolone (NASACORT AQ) 55 MCG/ACT AERO nasal inhaler Place 2 sprays into the nose daily. 1 Inhaler 12   No current facility-administered medications on file prior to visit.     Social History  Substance Use Topics  . Smoking status: Former Smoker  Packs/day: 1.50    Years: 10.00    Types: Cigarettes    Quit date: 04/12/1979  . Smokeless tobacco: Never Used  . Alcohol use No    Review of Systems  Constitutional: Negative for chills, fever and unexpected weight change.  HENT: Negative for congestion.   Respiratory: Negative for cough.   Cardiovascular: Negative for chest pain, palpitations and leg swelling.  Gastrointestinal: Positive for constipation and nausea. Negative for abdominal distention, abdominal pain and vomiting.  Genitourinary: Negative for dysuria.  Musculoskeletal: Positive for arthralgias. Negative for myalgias.  Skin: Negative for rash.  Neurological: Negative for headaches.  Hematological: Negative for adenopathy.  Psychiatric/Behavioral: Negative for confusion.      Objective:    BP 110/62   Pulse 89   Ht 5\' 6"  (1.676 m)   Wt 208 lb (94.3 kg)   SpO2 95%   BMI 33.57 kg/m    Physical Exam  Constitutional: She appears well-developed and well-nourished.  Eyes: Conjunctivae are normal.  Cardiovascular: Normal rate, regular rhythm, normal heart sounds and normal pulses.   Pulmonary/Chest: Effort normal and breath sounds normal. She has no wheezes. She has no rhonchi. She has no rales.  Abdominal: Soft. Normal appearance and bowel sounds are normal. She exhibits no distension, no fluid wave, no ascites and no mass. There is no  tenderness. There is no rigidity, no rebound, no guarding, no CVA tenderness and negative Murphy's sign.  No guarding or tenderness with palpation.   Musculoskeletal:       Right wrist: Normal. She exhibits normal range of motion, no tenderness and no swelling.       Left wrist: Normal. She exhibits normal range of motion, no tenderness and no swelling.       Right hand: Normal. She exhibits normal range of motion, no tenderness and no bony tenderness.       Left hand: Normal. She exhibits normal range of motion, no tenderness and no bony tenderness.  Neurological: She is alert.  Skin: Skin is warm and dry.  Psychiatric: She has a normal mood and affect. Her speech is normal and behavior is normal. Thought content normal.  Vitals reviewed.      Assessment & Plan:   1. RUQ pain Pain is nonspecific at this time. Reassured by normal abdominal exam. Pending lab work. If lab work unrevealing, suspect that IBS and constipation may be exacerbating this symptom. May also consider [place].  - CBC with Differential/Platelet; Future - Comprehensive metabolic panel; Future - Hepatitis C antibody; Future - CBC with Differential/Platelet - Comprehensive metabolic panel - Hepatitis C antibody  2. Arthralgia Normal exam. With recent CRP, Sed rate elevated, pending further lab evaluation. Reassured by negative family history for SLE, RA.  - Sedimentation rate; Future - Rheumatoid factor; Future - ANA w/Reflex if Positive; Future - C-reactive protein; Future - CYCLIC CITRUL PEPTIDE ANTIBODY, IGG/IGA; Future - Sedimentation rate - Rheumatoid factor - ANA w/Reflex if Positive - C-reactive protein - CYCLIC CITRUL PEPTIDE ANTIBODY, IGG/IGA    I am having [name] maintain her multivitamin with minerals, isosorbide mononitrate, dicyclomine, traZODone, calcium carbonate, diphenhydrAMINE, cyclobenzaprine, furosemide, ondansetron, pantoprazole, escitalopram, triamcinolone, EPINEPHrine, carvedilol,  potassium chloride, gabapentin, diltiazem, oxyCODONE-acetaminophen, ELIQUIS, and diltiazem.   No orders of the defined types were placed in this encounter.   Return precautions given.   Risks, benefits, and alternatives of the medications and treatment plan prescribed today were discussed, and patient expressed understanding.   Education regarding symptom management and diagnosis given to patient  on AVS.  Continue to follow with [name], MD for routine health maintenance.   Valerie Vazquez and I agreed with plan.   Mable Paris, FNP

## 2016-04-02 LAB — RHEUMATOID FACTOR: Rhuematoid fact SerPl-aCnc: <10 IU/mL (ref <=14)

## 2016-04-02 LAB — HEPATITIS C ANTIBODY: HCV Ab: NEGATIVE

## 2016-04-03 ENCOUNTER — Other Ambulatory Visit: Payer: Self-pay | Admitting: Family Medicine

## 2016-04-03 LAB — ANA W/REFLEX IF POSITIVE: Anti Nuclear Antibody(ANA): NEGATIVE

## 2016-04-03 LAB — CYCLIC CITRUL PEPTIDE ANTIBODY, IGG/IGA: Cyclic Citrullin Peptide Ab: 8 units (ref 0–19)

## 2016-04-04 IMAGING — CT CT ABD-PELV W/ CM
2 of 5 series · 17 of 46 positions shown, 19 images · IV contrast (agent unspecified)
Comparison: None.

CLINICAL DATA: Poor appetite and nausea for 2 months. Pain in the
abdomen once a week.

EXAM:
CT ABDOMEN AND PELVIS WITH CONTRAST
TECHNIQUE: Multidetector CT imaging of the abdomen and pelvis was performed
using the standard protocol following bolus administration of
intravenous contrast.
CONTRAST:  100 mL [id number]

[Series 3: routine abd pel with · axial · 0.87mm/px · z∈[-611,-161]mm · 14 of 102 slices shown, 16 images]
[im 6/102  soft-tissue]
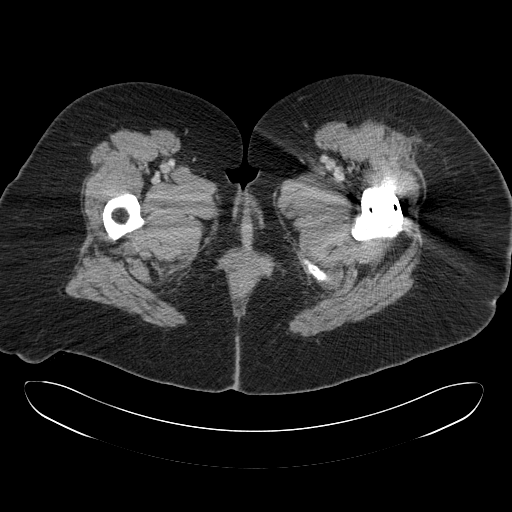
[im 6/102  bone]
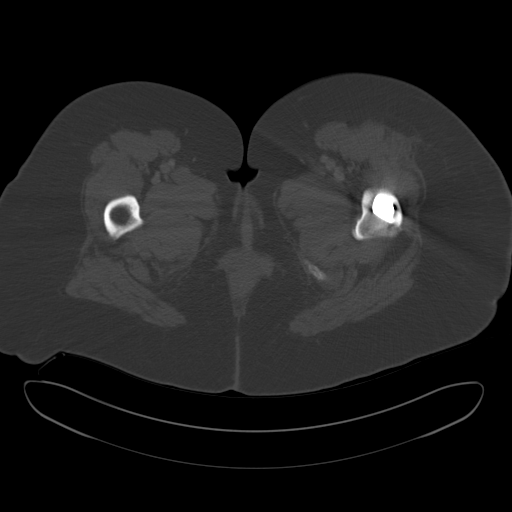
[im 11/102  soft-tissue]
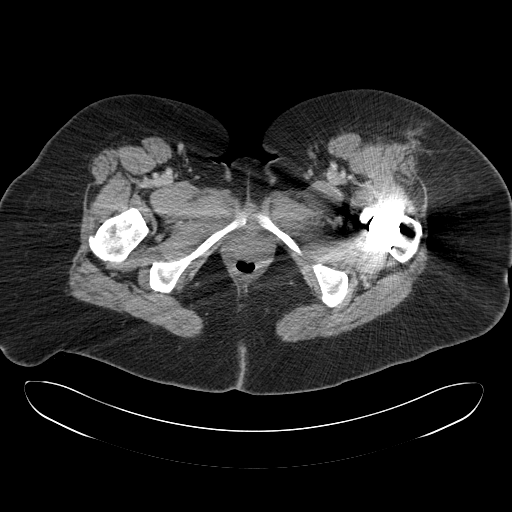
[im 22/102  soft-tissue]
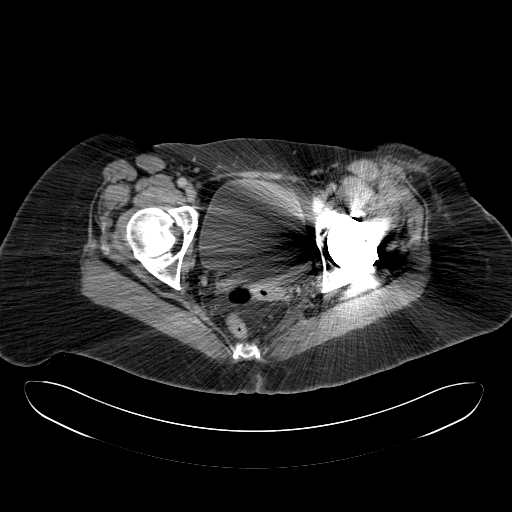
[im 27/102  soft-tissue]
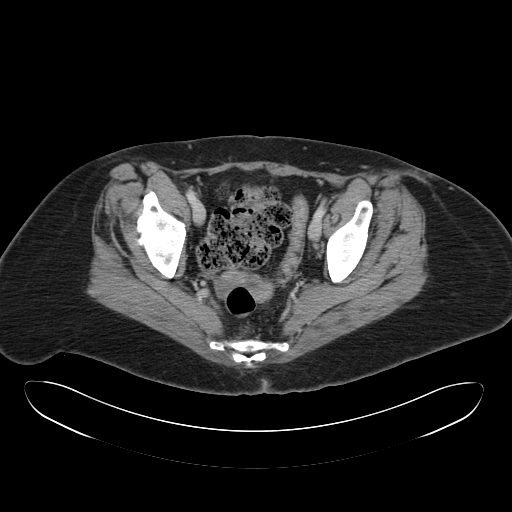
[im 32/102  soft-tissue]
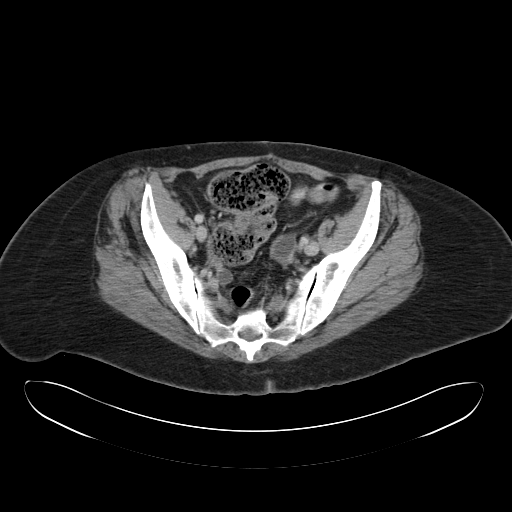
[im 43/102  soft-tissue]
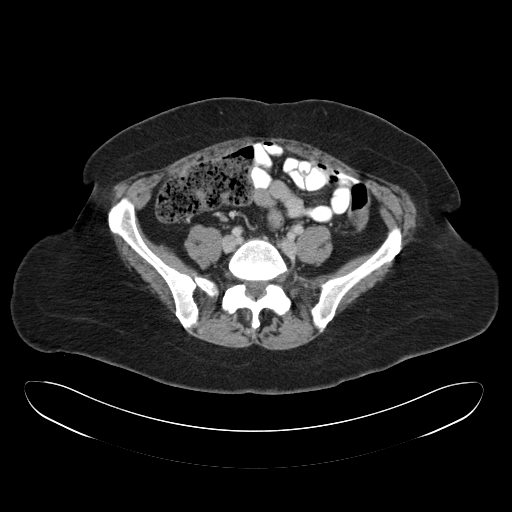
[im 48/102  soft-tissue]
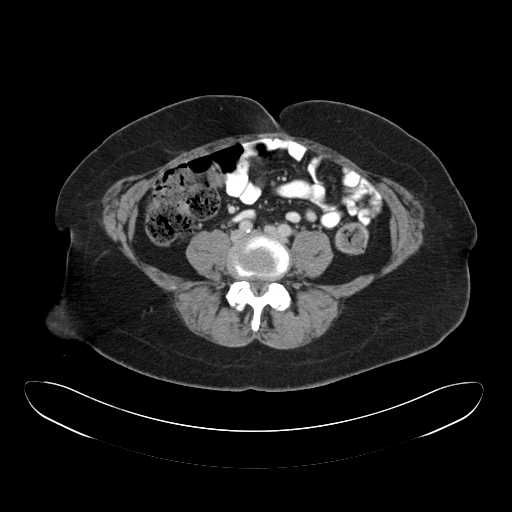
[im 54/102  soft-tissue]
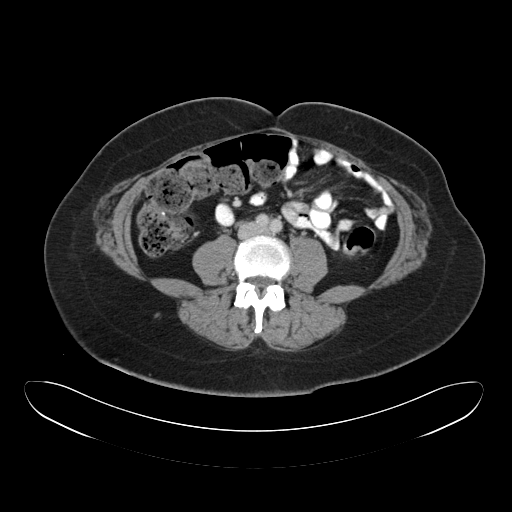
[im 59/102  soft-tissue]
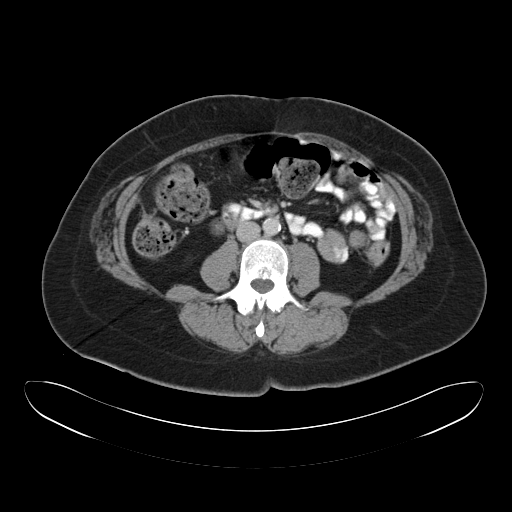
[im 59/102  bone]
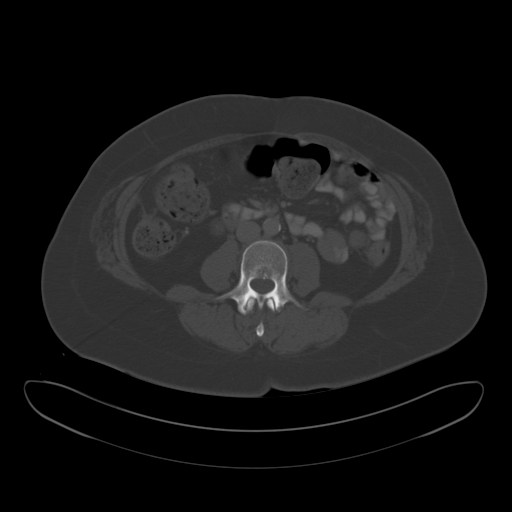
[im 70/102  soft-tissue]
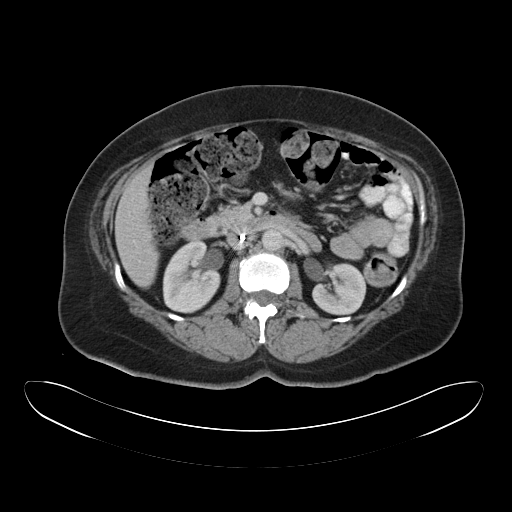
[im 75/102  soft-tissue]
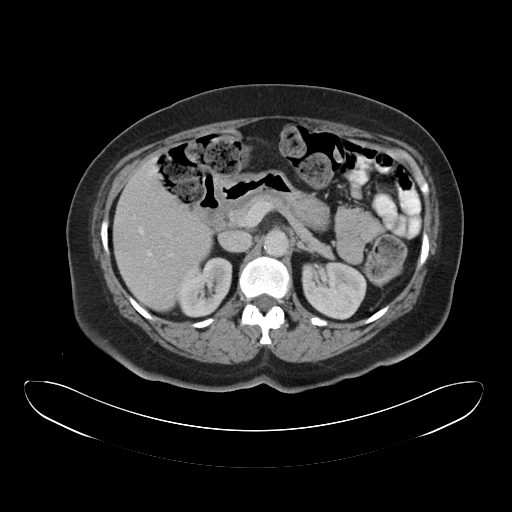
[im 80/102  soft-tissue]
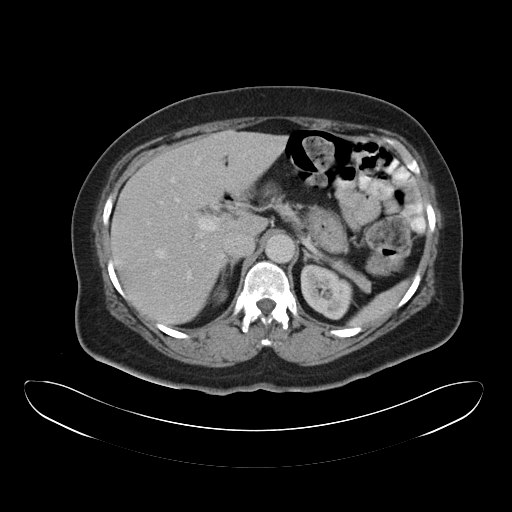
[im 91/102  soft-tissue]
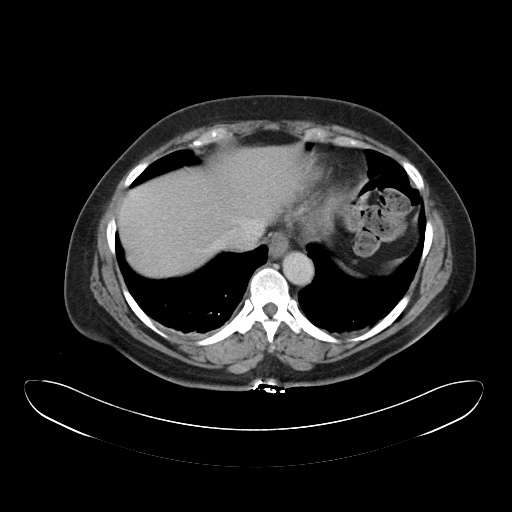
[im 96/102  soft-tissue]
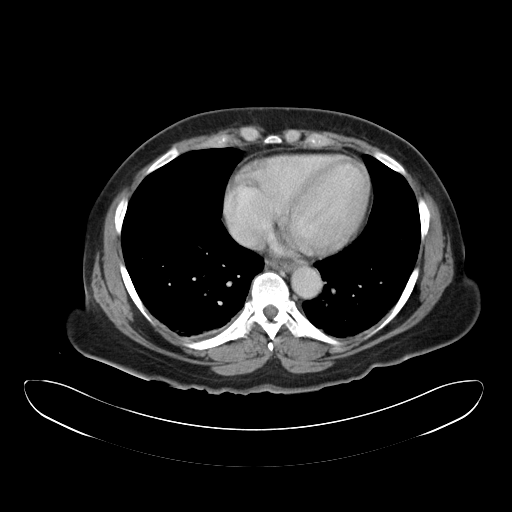

[Series 6: cor routine abd pel with · coronal · 0.87mm/px · 3 of 116 slices shown]
[im 39/116  soft-tissue]
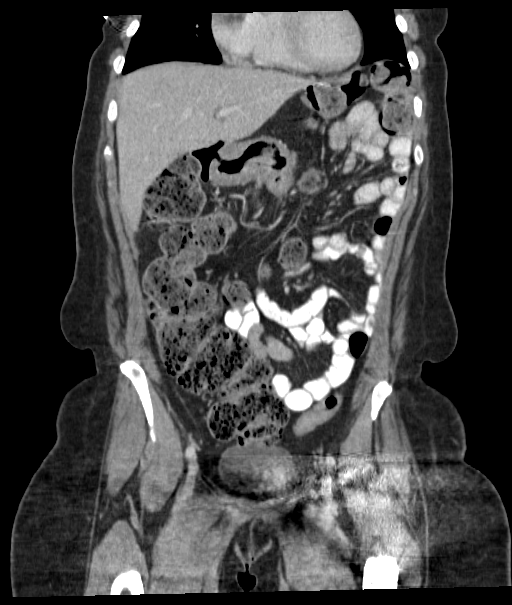
[im 52/116  soft-tissue]
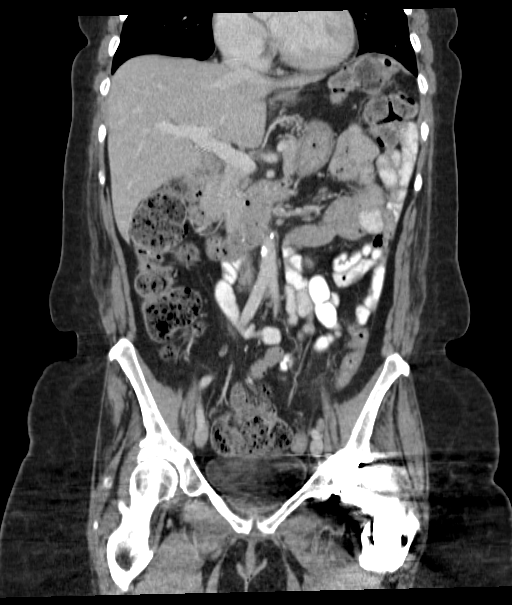
[im 64/116  soft-tissue]
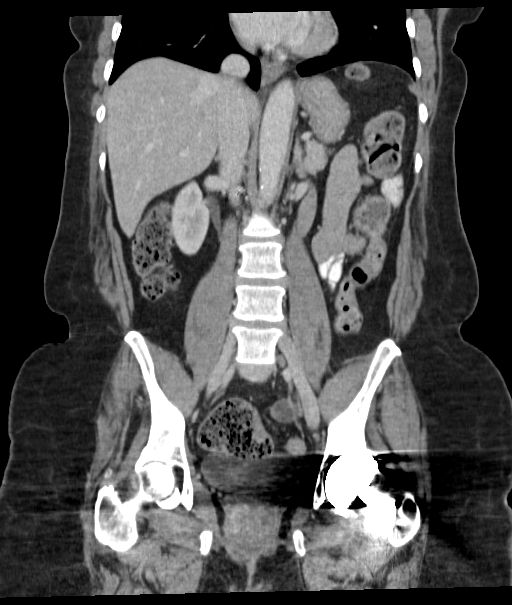

[17 of 46 positions shown; findings below may reference images not displayed]

FINDINGS: Atelectasis in the lung bases.

Surgical absence of the gallbladder. The liver spleen, pancreas,
adrenal glands, kidneys, abdominal aorta, and retroperitoneal lymph
nodes are unremarkable. Filter in the inferior vena cava. Stomach
and small bowel are decompressed. Stool-filled colon without
distention. No free air or free fluid in the abdomen.

Pelvis: Left hip arthroplasty with streak artifact obscuring
visualization of parts of the low pelvis. No pelvic mass or
lymphadenopathy. No inflammatory changes. No evidence of
diverticulitis. Appendix is not identified. No free or loculated
pelvic fluid collections. No destructive bone lesions.
IMPRESSION: No focal acute process demonstrated in the abdomen or pelvis.

## 2016-04-04 NOTE — Telephone Encounter (Signed)
Refill sent to pharmacy. Future refills should come from the patient's cardiologist.

## 2016-04-04 NOTE — Telephone Encounter (Signed)
Can we refill this? 

## 2016-04-05 ENCOUNTER — Encounter (HOSPITAL_COMMUNITY): Payer: Self-pay | Admitting: *Deleted

## 2016-04-05 ENCOUNTER — Emergency Department
Admission: EM | Admit: 2016-04-05 | Discharge: 2016-04-05 | Disposition: A | Discharge status: Home / Self Care | Payer: Medicare Other | Attending: Emergency Medicine | Admitting: Emergency Medicine

## 2016-04-05 ENCOUNTER — Encounter: Payer: Self-pay | Admitting: Radiology

## 2016-04-05 ENCOUNTER — Emergency Department: Payer: Medicare Other

## 2016-04-05 DIAGNOSIS — R109 Unspecified abdominal pain: Secondary | ICD-10-CM | POA: Diagnosis not present

## 2016-04-05 DIAGNOSIS — Z791 Long term (current) use of non-steroidal anti-inflammatories (NSAID): Secondary | ICD-10-CM | POA: Diagnosis not present

## 2016-04-05 DIAGNOSIS — Z79899 Other long term (current) drug therapy: Secondary | ICD-10-CM | POA: Diagnosis not present

## 2016-04-05 DIAGNOSIS — Y939 Activity, unspecified: Secondary | ICD-10-CM | POA: Insufficient documentation

## 2016-04-05 DIAGNOSIS — W57XXXA Bitten or stung by nonvenomous insect and other nonvenomous arthropods, initial encounter: Secondary | ICD-10-CM | POA: Insufficient documentation

## 2016-04-05 DIAGNOSIS — I252 Old myocardial infarction: Secondary | ICD-10-CM | POA: Diagnosis not present

## 2016-04-05 DIAGNOSIS — E119 Type 2 diabetes mellitus without complications: Secondary | ICD-10-CM | POA: Insufficient documentation

## 2016-04-05 DIAGNOSIS — S40261A Insect bite (nonvenomous) of right shoulder, initial encounter: Secondary | ICD-10-CM | POA: Insufficient documentation

## 2016-04-05 DIAGNOSIS — Z87891 Personal history of nicotine dependence: Secondary | ICD-10-CM | POA: Insufficient documentation

## 2016-04-05 DIAGNOSIS — I10 Essential (primary) hypertension: Secondary | ICD-10-CM | POA: Diagnosis not present

## 2016-04-05 DIAGNOSIS — Z7901 Long term (current) use of anticoagulants: Secondary | ICD-10-CM | POA: Insufficient documentation

## 2016-04-05 DIAGNOSIS — Z96642 Presence of left artificial hip joint: Secondary | ICD-10-CM | POA: Diagnosis not present

## 2016-04-05 DIAGNOSIS — K5900 Constipation, unspecified: Secondary | ICD-10-CM | POA: Diagnosis not present

## 2016-04-05 DIAGNOSIS — R1084 Generalized abdominal pain: Secondary | ICD-10-CM | POA: Diagnosis not present

## 2016-04-05 DIAGNOSIS — Y999 Unspecified external cause status: Secondary | ICD-10-CM | POA: Insufficient documentation

## 2016-04-05 DIAGNOSIS — Y929 Unspecified place or not applicable: Secondary | ICD-10-CM | POA: Insufficient documentation

## 2016-04-05 DIAGNOSIS — K529 Noninfective gastroenteritis and colitis, unspecified: Secondary | ICD-10-CM | POA: Insufficient documentation

## 2016-04-05 LAB — CBC WITH DIFFERENTIAL/PLATELET
Basophils Absolute: 0 10*3/uL (ref 0–0.1)
Basophils Relative: 0 %
Eosinophils Absolute: 0.1 10*3/uL (ref 0–0.7)
Eosinophils Relative: 0 %
HCT: 36.3 % (ref 35.0–47.0)
Hemoglobin: 12.3 g/dL (ref 12.0–16.0)
Lymphocytes Relative: 6 %
Lymphs Abs: 1.2 10*3/uL (ref 1.0–3.6)
MCH: 30.5 pg (ref 26.0–34.0)
MCHC: 33.8 g/dL (ref 32.0–36.0)
MCV: 90.4 fL (ref 80.0–100.0)
Monocytes Absolute: 1.4 10*3/uL — ABNORMAL HIGH (ref 0.2–0.9)
Monocytes Relative: 7 %
Neutro Abs: 15.7 10*3/uL — ABNORMAL HIGH (ref 1.4–6.5)
Neutrophils Relative %: 87 %
Platelets: 357 10*3/uL (ref 150–440)
RBC: 4.01 MIL/uL (ref 3.80–5.20)
RDW: 14.3 % (ref 11.5–14.5)
WBC: 18.3 10*3/uL — ABNORMAL HIGH (ref 3.6–11.0)

## 2016-04-05 LAB — COMPREHENSIVE METABOLIC PANEL
ALT: 19 U/L (ref 14–54)
AST: 29 U/L (ref 15–41)
Albumin: 3.9 g/dL (ref 3.5–5.0)
Alkaline Phosphatase: 99 U/L (ref 38–126)
Anion gap: 5 (ref 5–15)
BUN: 15 mg/dL (ref 6–20)
CO2: 28 mmol/L (ref 22–32)
Calcium: 9 mg/dL (ref 8.9–10.3)
Chloride: 106 mmol/L (ref 101–111)
Creatinine, Ser: 0.93 mg/dL (ref 0.44–1.00)
GFR calc Af Amer: >60 mL/min (ref >60)
GFR calc non Af Amer: >60 mL/min (ref >60)
Glucose, Bld: 155 mg/dL — ABNORMAL HIGH (ref 65–99)
Potassium: 4.5 mmol/L (ref 3.5–5.1)
Sodium: 139 mmol/L (ref 135–145)
Total Bilirubin: 0.8 mg/dL (ref 0.3–1.2)
Total Protein: 7.5 g/dL (ref 6.5–8.1)

## 2016-04-05 LAB — MAGNESIUM: Magnesium: 2.2 mg/dL (ref 1.7–2.4)

## 2016-04-05 LAB — LIPASE, BLOOD: Lipase: 10 U/L — ABNORMAL LOW (ref 11–51)

## 2016-04-05 MED ORDER — DICYCLOMINE HCL 10 MG/ML IM SOLN
INTRAMUSCULAR | Status: AC
  Administered 2016-04-05: 20 mg via INTRAMUSCULAR
  Filled 2016-04-05: qty 2

## 2016-04-05 MED ORDER — IOPAMIDOL (ISOVUE-300) INJECTION 61%
100.0000 mL | Freq: Once | INTRAVENOUS | Status: AC | PRN
  Administered 2016-04-05: 100 mL via INTRAVENOUS

## 2016-04-05 MED ORDER — METRONIDAZOLE 500 MG PO TABS
500.0000 mg | ORAL_TABLET | Freq: Three times a day (TID) | ORAL | 0 refills | Status: AC
Start: 2016-04-05 — End: 2016-04-15

## 2016-04-05 MED ORDER — CIPROFLOXACIN HCL 500 MG PO TABS: 500.0000 mg | ORAL_TABLET | Freq: Two times a day (BID) | ORAL | 0 refills | Status: AC

## 2016-04-05 MED ORDER — DIATRIZOATE MEGLUMINE & SODIUM 66-10 % PO SOLN
15.0000 mL | Freq: Once | ORAL | Status: AC
  Administered 2016-04-05: 15 mL via ORAL

## 2016-04-05 MED ORDER — DICYCLOMINE HCL 10 MG/ML IM SOLN
20.0000 mg | Freq: Once | INTRAMUSCULAR | Status: AC
  Administered 2016-04-05: 20 mg via INTRAMUSCULAR

## 2016-04-05 NOTE — ED Notes (Signed)
Md at bedside

## 2016-04-05 NOTE — ED Notes (Signed)
Pt states that the oral contrast is making her "stomach blow up" causing more pain. Pt drank approx 1/3 of contrast. CT informed.

## 2016-04-05 NOTE — ED Provider Notes (Signed)
[facility] Emergency Department Provider Note   ____________________________________________   I have reviewed the triage vital signs and the nursing notes.   HISTORY  Chief Complaint Abdominal Pain and Constipation   History limited by: Not Limited   HPI Valerie Vazquez is a 60 y.o. female who presents to the emergency department today because of concern for abdominal pain. It started last night and woke the patient from sleep. It is located in the lower abdomen. It comes and goes in waves. It is cramping. It has been accompanied by vomiting. The patient felt constipated yesterday and took a couple of laxatives and an enema with a small amount of output. The patient has not appreciated any fevers.   Past Medical History:  Diagnosis Date  . Anemia   . Bilateral renal cysts   . Chronic back pain    "upper and lower" (09/19/2014)  . Depression    "major" (09/19/2014)  . DVT (deep venous thrombosis) ([place])   . Esophageal spasm   . Gallstones   . Gastritis   . Gastroenteritis   . GERD (gastroesophageal reflux disease)   . Headache    "couple /month lately" (09/19/2014)  . High blood pressure   . High cholesterol   . History of blood transfusion 1985   related to hysterectomy  . Hypertension   . IBS (irritable bowel syndrome)   . Migraine    "a few times/yr" (09/19/2014)  . Myocardial infarction ([place]) 10/2010 X 3   "while hospitalized"  . Osteoarthritis    "qwhere; mostly around my joints" (09/19/2014)  . Pneumonia 04/2014  . PTSD (post-traumatic stress disorder)   . Pulmonary embolism ([place]) 04/2001; 09/19/2014   "after gallbladder OR; "  . Schizoaffective disorder ([place])   . Sleep apnea    "mild" (09/19/2014)  . Snoring    a. sleep study 5/16: No OSA  . Spinal stenosis   . Spondylosis   . Type II diabetes mellitus ([place])    "dx'd in 2007; lost weight; no RX for ~ 4 yr now" (09/19/2014)    Patient Active Problem List   Diagnosis Date Noted   . Tremor 03/19/2016  . Left shoulder pain 02/18/2016  . Abdominal distension 02/18/2016  . Absolute anemia 01/29/2016  . Chronic pain 01/29/2016  . Neurogenic pain 01/29/2016  . Musculoskeletal pain 01/29/2016  . Long term current use of opiate analgesic 01/29/2016  . Long term prescription opiate use 01/29/2016  . Opiate use (15 MME/Day) 01/29/2016  . Encounter for therapeutic drug level monitoring 01/29/2016  . Encounter for pain management planning 01/29/2016  . Chronic neck pain (midline) 01/29/2016  . Chronic chest wall pain 01/29/2016  . Chronic low back pain (Location of Secondary source of pain) (Bilateral) (R>L) 01/29/2016  . Disturbance of skin sensation 01/29/2016  . Chronic shoulder pain (Location of Primary Source of Pain) (Left) 01/29/2016  . Chronic hip pain (Location of Tertiary source of pain) (Bilateral) (R>L) 01/29/2016  . Osteoarthritis of hip (Bilateral) 01/29/2016  . Lumbar facet arthropathy (Severe at L2-3, L3-4, L4-5, and L5-S1) 01/29/2016  . Lumbar central spinal stenosis (moderate at L4-5; mild at L2-3 and L3-4) 01/29/2016  . Lumbar foraminal stenosis (L4-5) (Right) 01/29/2016  . Lumbar facet syndrome 01/29/2016  . Hx of cervical spine surgery 01/29/2016  . Numbness 11/19/2015  . Right wrist pain 11/09/2015  . Breast pain 10/19/2015  . Chest pain 10/14/2015  . Headache 10/14/2015  . Abdominal pain 10/14/2015  . Depression 10/14/2015  .  Lip swelling 10/14/2015  . Pulmonary embolism ([place]) 08/15/2015  . Bilateral pneumonia 08/15/2015  . Nausea 08/15/2015  . Shortness of breath 07/31/2015  . Pleural effusion, bilateral 07/31/2015  . Cough 07/31/2015  . Chronic pain of multiple sites 06/05/2015  . Chronic lumbosacral pain 06/05/2015  . Esophageal spasm 06/05/2015  . History of DVT (deep vein thrombosis) 02/25/2015  . 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 Constipation 12/12/2014  . Coronary vasospasm ([place]) 12/09/2014  . History of pulmonary embolus (PE)  09/19/2014  . Impingement syndrome of shoulder 08/30/2014  . Impingement syndrome of left shoulder 08/30/2014  . Injury of superior glenoid labrum of shoulder joint 08/16/2014  . Supraspinatus tenosynovitis 08/16/2014  . Cervical spondylosis without myelopathy 08/16/2014  . Anxiety and depression 04/13/2014  . Osteoarthritis of hip (Left) 04/11/2014  . S/P THR (total hip replacement) (Left) 04/11/2014  . IBS (irritable bowel syndrome) 04/11/2014  . GERD (gastroesophageal reflux disease) 04/11/2014  . Insomnia 04/11/2014  . Essential hypertension, benign 10/28/2013  . Left hip pain 10/28/2013  . Anemia 10/28/2013  . HLD (hyperlipidemia) 10/28/2013    Past Surgical History:  Procedure Laterality Date  . ABDOMINAL HYSTERECTOMY  1985  . ANTERIOR CERVICAL DECOMP/DISCECTOMY FUSION  10/2012  . APPENDECTOMY  1985  . BACK SURGERY    . BREAST CYST EXCISION Right   . BREAST EXCISIONAL BIOPSY Right YRS AGO   NEG  . CARDIAC CATHETERIZATION   2000's; 2009  . CHOLECYSTECTOMY  2002  . EXCISION/RELEASE BURSA HIP Right 12/1984  . HERNIA REPAIR  1985  . LEFT HEART CATHETERIZATION WITH CORONARY ANGIOGRAM N/A 12/09/2014   Procedure: LEFT HEART CATHETERIZATION WITH CORONARY ANGIOGRAM;  Surgeon: Burnell Blanks, MD;  Location: [facility] CATH LAB;  Service: Cardiovascular;  Laterality: N/A;  . TONSILLECTOMY AND ADENOIDECTOMY  ~ 1968  . TOTAL HIP ARTHROPLASTY Left 04-06-2014  . UMBILICAL HERNIA REPAIR  1985  . VENA CAVA FILTER PLACEMENT  03/2014    Prior to Admission medications   Medication Sig Start Date End Date Taking? Authorizing Provider  calcium carbonate (TUMS - DOSED IN MG ELEMENTAL CALCIUM) 500 MG chewable tablet Chew 1 tablet by mouth daily as needed for indigestion or heartburn.    Historical Provider, MD  carvedilol (COREG) 3.125 MG tablet Take 1 tablet (3.125 mg total) by mouth 2 (two) times daily with a meal. 10/22/15   Burnell Blanks, MD  cyclobenzaprine (FLEXERIL) 10 MG tablet  Take 1 tablet (10 mg total) by mouth 3 (three) times daily as needed for muscle spasms. 08/13/15   [name], MD  dicyclomine (BENTYL) 10 MG capsule TAKE 2 CAPSULES BY MOUTH EVERY SIX HOURS Patient taking differently: TAKE 1 CAPSULES BY MOUTH THREE TIMES DAILY 04/09/15   [name], MD  diltiazem (CARDIZEM CD) 120 MG 24 hr capsule Take 120 mg by mouth daily. 12/26/15   Historical Provider, MD  diltiazem (CARDIZEM SR) 60 MG 12 hr capsule Take 1 capsule (60 mg total) by mouth every 6 (six) hours as needed. 02/05/16   Manus Gunning, MD  diphenhydrAMINE (BENADRYL) 25 MG tablet Take 1-2 tablets (25-50 mg total) by mouth every 6 (six) hours as needed for itching ([place]). 07/14/15   Kelly Humes, PA-C  ELIQUIS 5 MG TABS tablet TAKE 1 TABLET (5 MG TOTAL) BY MOUTH 2 (TWO) TIMES DAILY. 03/26/16   [name], MD  EPINEPHrine 0.3 mg/0.3 mL IJ SOAJ injection Inject 0.3 mLs (0.3 mg total) into the muscle once. 10/12/15   [name], MD  escitalopram (  LEXAPRO) 10 MG tablet TAKE 1 TABLET BY MOUTH EVERY DAY 10/01/15   [name], MD  furosemide (LASIX) 20 MG tablet TAKE 1 TABLET BY MOUTH EVERY DAY Patient taking differently: TAKE 1 TABLET BY MOUTH EVERY DAY prn 08/14/15   [name], MD  gabapentin (NEURONTIN) 600 MG tablet Take 0.5 tablets (300 mg total) by mouth every 8 (eight) hours. 01/24/16   [name], MD  isosorbide mononitrate (IMDUR) 30 MG 24 hr tablet TAKE 1 TABLET BY MOUTH EVERY DAY 04/04/16   [name], MD  Multiple Vitamin (MULTIVITAMIN WITH MINERALS) TABS tablet Take 2 tablets by mouth daily.     Historical Provider, MD  ondansetron (ZOFRAN) 4 MG tablet Take 1 tablet (4 mg total) by mouth every 8 (eight) hours as needed for nausea or vomiting. 08/15/15   [name], MD  oxyCODONE-acetaminophen (PERCOCET/ROXICET) 5-325 MG tablet Take 1 tablet by mouth 2 (two) times daily as needed for severe pain. 02/18/16   [name], MD  pantoprazole (PROTONIX)  40 MG tablet TAKE ONE TABLET BY MOUTH EVERY DAY 09/03/15   [name], MD  potassium chloride (K-DUR) 10 MEQ tablet Take 1 tablet (10 mEq total) by mouth 2 (two) times daily. Patient taking differently: Take 10 mEq by mouth 2 (two) times daily as needed.  10/22/15   Burnell Blanks, MD  traZODone (DESYREL) 50 MG tablet Take 1 tablet (50 mg total) by mouth at bedtime. Patient taking differently: Take 50 mg by mouth at bedtime as needed for sleep.  04/24/15   [name], MD  triamcinolone (NASACORT AQ) 55 MCG/ACT AERO nasal inhaler Place 2 sprays into the nose daily. 10/02/15   Wilhelmina Mcardle, MD    Allergies Abilify [aripiprazole]; Augmentin [amoxicillin-pot clavulanate]; Bactrim [sulfamethoxazole-trimethoprim]; Ceftin [cefuroxime axetil]; Coconut oil; Diclofenac; Dye fdc red [red dye]; Indocin [indomethacin]; Linaclotide; Lisinopril; Morphine; Morphine and related; Simvastatin; Sulfa antibiotics; Sulfamethoxazole-trimethoprim; Wellbutrin [bupropion]; and Quetiapine fumarate  Family History  Problem Relation Age of Onset  . Stroke Mother     Deceased  . Lung cancer Father     Deceased  . Healthy Daughter   . Healthy Son     x 2  . Other Son     Suicide  . Heart attack Neg Hx     Social History Social History  Substance Use Topics  . Smoking status: Former Smoker    Packs/day: 1.50    Years: 10.00    Types: Cigarettes    Quit date: 04/12/1979  . Smokeless tobacco: Never Used  . Alcohol use No    Review of Systems  Constitutional: Negative for fever. Cardiovascular: Negative for chest pain. Respiratory: Negative for shortness of breath. Gastrointestinal: Positive for lower abdominal pain. Neurological: Negative for headaches, focal weakness or numbness.  10-point ROS otherwise negative.  ____________________________________________   PHYSICAL EXAM:  VITAL SIGNS: ED Triage Vitals  Enc Vitals Group     BP 04/05/16 0608 110/85     Pulse Rate 04/05/16  0608 85     Resp 04/05/16 0608 (!) 22     Temp --      Temp src --      SpO2 04/05/16 0608 97 %     Weight 04/05/16 0606 208 lb (94.3 kg)     Height 04/05/16 0606 5\' 6"  (1.676 m)     Head Circumference --      Peak Flow --      Pain Score 04/05/16 0606 6   Constitutional: Alert  and oriented. Well appearing and in no distress. Eyes: Conjunctivae are normal. PERRL. Normal extraocular movements. ENT   Head: Normocephalic and atraumatic.   Nose: No congestion/rhinnorhea.   Mouth/Throat: Mucous membranes are moist.   Neck: No stridor. Hematological/Lymphatic/Immunilogical: No cervical lymphadenopathy. Cardiovascular: Normal rate, regular rhythm.  No murmurs, rubs, or gallops. Respiratory: Normal respiratory effort without tachypnea nor retractions. Breath sounds are clear and equal bilaterally. No wheezes/rales/rhonchi. Gastrointestinal: Soft and minimally tender in the lower abdomen. No rebound. No guarding. Rectal: No impaction. Musculoskeletal: Normal range of motion in all extremities. No joint effusions.  No lower extremity tenderness nor edema. Neurologic:  Normal speech and language. No gross focal neurologic deficits are appreciated.  Skin:  Skin is warm, dry and intact. No rash noted. Psychiatric: Mood and affect are normal. Speech and behavior are normal. Patient exhibits appropriate insight and judgment.  ____________________________________________    LABS (pertinent positives/negatives)  Labs Reviewed  CBC WITH DIFFERENTIAL/PLATELET - Abnormal; Notable for the following:       Result Value   WBC 18.3 (*)    Neutro Abs 15.7 (*)    Monocytes Absolute 1.4 (*)    All other components within normal limits  COMPREHENSIVE METABOLIC PANEL - Abnormal; Notable for the following:    Glucose, Bld 155 (*)    All other components within normal limits  LIPASE, BLOOD - Abnormal; Notable for the following:    Lipase 10 (*)    All other components within normal limits   MAGNESIUM     ____________________________________________   EKG  None  ____________________________________________    RADIOLOGY  Abd acute IMPRESSION: Normal chest.  Nonspecific bowel gas pattern, paucity of large bowel gas. Moderate amount of retained large bowel stool.  ____________________________________________   PROCEDURES  Procedures  ____________________________________________   INITIAL IMPRESSION / ASSESSMENT AND PLAN / ED COURSE  Pertinent labs & imaging results that were available during my care of the patient were reviewed by me and considered in my medical decision making (see chart for details).  Patient presents because of concern for lower abdominal pain and constipation. No impaction. Minimally tender. Abd x-rays without concerning findings. Will check blood work.  Clinical Course   Blood work did have a leukocytosis so a CT scan was checked. CT scan showed possible colitis and some stool. The patient was given prescriptions for antibiotics. Additionally patient had a large bowel movement here in the hospital prior to discharge. ____________________________________________   FINAL CLINICAL IMPRESSION(S) / ED DIAGNOSES  Final diagnoses:  Constipation, unspecified constipation type  Colitis     Note: This dictation was prepared with Dragon dictation. Any transcriptional errors that result from this process are unintentional    Nance Pear, MD 04/05/16 1316

## 2016-04-05 NOTE — ED Triage Notes (Signed)
Patient to triage via wheelchair by EMS.  Patient reports being constipated, states last regular bowel movement was 5 days ago, has history of IBS.  Patient states she took laxative at home with no relief and also gave self enema with small amount of stool.

## 2016-04-05 NOTE — ED Notes (Signed)
Pt alert and oriented X4, active, cooperative, pt in NAD. RR even and unlabored, color WNL.  Pt informed to return if any life threatening symptoms occur.   

## 2016-04-05 NOTE — ED Notes (Signed)
Pt had large BM.  

## 2016-04-05 NOTE — ED Notes (Signed)
With MD at bedside to assess patient. Witness for rectal exam.

## 2016-04-05 NOTE — ED Notes (Signed)
Constipation x 5 days, took laxative and 2 enemas without relief. Pt states laxative made her feel worse. Pt resting in bed upon entering room. Pt alert and oriented X4, active, cooperative, pt in NAD. RR even and unlabored, color WNL.  Hx of constipation, but "under different circumstances". Denies taking any pain medications that may attribute to constipation.

## 2016-04-05 NOTE — ED Triage Notes (Signed)
Patient presents stating she was seen for abd cramping.  Tonight she noticed 6 bug bites on her shoulder and then started with palpitations

## 2016-04-06 ENCOUNTER — Emergency Department (HOSPITAL_COMMUNITY)
Admission: EM | Admit: 2016-04-06 | Discharge: 2016-04-06 | Disposition: A | Discharge status: Home / Self Care | Payer: Medicare Other | Attending: Emergency Medicine | Admitting: Emergency Medicine

## 2016-04-06 DIAGNOSIS — K529 Noninfective gastroenteritis and colitis, unspecified: Secondary | ICD-10-CM

## 2016-04-06 DIAGNOSIS — S40261A Insect bite (nonvenomous) of right shoulder, initial encounter: Secondary | ICD-10-CM | POA: Diagnosis not present

## 2016-04-06 LAB — CBC WITH DIFFERENTIAL/PLATELET
Basophils Absolute: 0 10*3/uL (ref 0.0–0.1)
Basophils Relative: 0 %
Eosinophils Absolute: 0.3 10*3/uL (ref 0.0–0.7)
Eosinophils Relative: 3 %
HCT: 35.5 % — ABNORMAL LOW (ref 36.0–46.0)
Hemoglobin: 11.6 g/dL — ABNORMAL LOW (ref 12.0–15.0)
Lymphocytes Relative: 31 %
Lymphs Abs: 3.1 10*3/uL (ref 0.7–4.0)
MCH: 29.4 pg (ref 26.0–34.0)
MCHC: 32.7 g/dL (ref 30.0–36.0)
MCV: 90.1 fL (ref 78.0–100.0)
Monocytes Absolute: 0.6 10*3/uL (ref 0.1–1.0)
Monocytes Relative: 6 %
Neutro Abs: 6.1 10*3/uL (ref 1.7–7.7)
Neutrophils Relative %: 60 %
Platelets: 404 10*3/uL — ABNORMAL HIGH (ref 150–400)
RBC: 3.94 MIL/uL (ref 3.87–5.11)
RDW: 13.9 % (ref 11.5–15.5)
WBC: 10 10*3/uL (ref 4.0–10.5)

## 2016-04-06 LAB — URINALYSIS, ROUTINE W REFLEX MICROSCOPIC
Bilirubin Urine: NEGATIVE
Glucose, UA: NEGATIVE mg/dL
Hgb urine dipstick: NEGATIVE
Ketones, ur: 15 mg/dL — AB
Leukocytes, UA: NEGATIVE
Nitrite: NEGATIVE
Protein, ur: NEGATIVE mg/dL
Specific Gravity, Urine: 1.02 (ref 1.005–1.030)
pH: 5.5 (ref 5.0–8.0)

## 2016-04-06 LAB — COMPREHENSIVE METABOLIC PANEL
ALT: 18 U/L (ref 14–54)
AST: 23 U/L (ref 15–41)
Albumin: 3.7 g/dL (ref 3.5–5.0)
Alkaline Phosphatase: 90 U/L (ref 38–126)
Anion gap: 8 (ref 5–15)
BUN: 10 mg/dL (ref 6–20)
CO2: 27 mmol/L (ref 22–32)
Calcium: 9.1 mg/dL (ref 8.9–10.3)
Chloride: 103 mmol/L (ref 101–111)
Creatinine, Ser: 0.95 mg/dL (ref 0.44–1.00)
GFR calc Af Amer: >60 mL/min (ref >60)
GFR calc non Af Amer: >60 mL/min (ref >60)
Glucose, Bld: 112 mg/dL — ABNORMAL HIGH (ref 65–99)
Potassium: 3.8 mmol/L (ref 3.5–5.1)
Sodium: 138 mmol/L (ref 135–145)
Total Bilirubin: 0.8 mg/dL (ref 0.3–1.2)
Total Protein: 7 g/dL (ref 6.5–8.1)

## 2016-04-06 LAB — I-STAT TROPONIN, ED: Troponin i, poc: 0 ng/mL (ref 0.00–0.08)

## 2016-04-06 LAB — LIPASE, BLOOD: Lipase: 12 U/L (ref 11–51)

## 2016-04-06 MED ORDER — SUCRALFATE 1 GM/10ML PO SUSP
1.0000 g | Freq: Once | ORAL | Status: AC
  Administered 2016-04-06: 1 g via ORAL
  Filled 2016-04-06: qty 10

## 2016-04-06 NOTE — ED Notes (Signed)
Pt upset at dc time, refuses to sign for dc instructions, states " You guys didn't do anything for me, I am dehydrated and I even got no fluids on me, I will never come back to this hospital since I didn't get the care that I deserve", pt oriented that her labs at in [facility] and that Dr. Claudine Mouton refers her to follow up with PCP. Pt requested phone number for pt experience, number provided to the pt.

## 2016-04-06 NOTE — ED Provider Notes (Signed)
[facility] DEPT Provider Note   CSN: [id number] Arrival date & time: 04/05/16  2314  First Provider Contact:   First MD Initiated Contact with Patient 04/06/16 0127      By signing my name below, I, Evelene Croon, attest that this documentation has been prepared under the direction and in the presence of Everlene Balls, MD . Electronically Signed: Evelene Croon, Scribe. 04/06/2016. 1:40 AM.  History   Chief Complaint Chief Complaint  Patient presents with  . Insect Bite  . Abdominal Pain    The history is provided by the patient. No language interpreter was used.    HPI Comments:  Valerie Vazquez is a 60 y.o. female who presents to the Emergency Department complaining of "severe" abdominal pain which began 2 days ago. She reports associated vomiting and diarrhea. Pt was evaluated at [facility] on 04/05/16 for abdominal pain and constipation. Pt had a large BM while at [place]. She states she was diagnosed with dehydration and colitis. Pt also states she was given a shot of bentyl and discharged home with Rx for antibiotics which she has not filled.   At this time pt is also complaining of insect bites to right shoulder. No alleviating factors noted.     Past Medical History:  Diagnosis Date  . Anemia   . Bilateral renal cysts   . Chronic back pain    "upper and lower" (09/19/2014)  . Depression    "major" (09/19/2014)  . DVT (deep venous thrombosis) ([place])   . Esophageal spasm   . Gallstones   . Gastritis   . Gastroenteritis   . GERD (gastroesophageal reflux disease)   . Headache    "couple /month lately" (09/19/2014)  . High blood pressure   . High cholesterol   . History of blood transfusion 1985   related to hysterectomy  . Hypertension   . IBS (irritable bowel syndrome)   . Migraine    "a few times/yr" (09/19/2014)  . Myocardial infarction ([place]) 10/2010 X 3   "while hospitalized"  . Osteoarthritis    "qwhere; mostly around my joints" (09/19/2014)  . Pneumonia  04/2014  . PTSD (post-traumatic stress disorder)   . Pulmonary embolism ([place]) 04/2001; 09/19/2014   "after gallbladder OR; "  . Schizoaffective disorder ([place])   . Sleep apnea    "mild" (09/19/2014)  . Snoring    a. sleep study 5/16: No OSA  . Spinal stenosis   . Spondylosis   . Type II diabetes mellitus ([place])    "dx'd in 2007; lost weight; no RX for ~ 4 yr now" (09/19/2014)    Patient Active Problem List   Diagnosis Date Noted  . Tremor 03/19/2016  . Left shoulder pain 02/18/2016  . Abdominal distension 02/18/2016  . Absolute anemia 01/29/2016  . Chronic pain 01/29/2016  . Neurogenic pain 01/29/2016  . Musculoskeletal pain 01/29/2016  . Long term current use of opiate analgesic 01/29/2016  . Long term prescription opiate use 01/29/2016  . Opiate use (15 MME/Day) 01/29/2016  . Encounter for therapeutic drug level monitoring 01/29/2016  . Encounter for pain management planning 01/29/2016  . Chronic neck pain (midline) 01/29/2016  . Chronic chest wall pain 01/29/2016  . Chronic low back pain (Location of Secondary source of pain) (Bilateral) (R>L) 01/29/2016  . Disturbance of skin sensation 01/29/2016  . Chronic shoulder pain (Location of Primary Source of Pain) (Left) 01/29/2016  . Chronic hip pain (Location of Tertiary source of pain) (Bilateral) (R>L) 01/29/2016  . Osteoarthritis of  hip (Bilateral) 01/29/2016  . Lumbar facet arthropathy (Severe at L2-3, L3-4, L4-5, and L5-S1) 01/29/2016  . Lumbar central spinal stenosis (moderate at L4-5; mild at L2-3 and L3-4) 01/29/2016  . Lumbar foraminal stenosis (L4-5) (Right) 01/29/2016  . Lumbar facet syndrome 01/29/2016  . Hx of cervical spine surgery 01/29/2016  . Numbness 11/19/2015  . Right wrist pain 11/09/2015  . Breast pain 10/19/2015  . Chest pain 10/14/2015  . Headache 10/14/2015  . Abdominal pain 10/14/2015  . Depression 10/14/2015  . Lip swelling 10/14/2015  . Pulmonary embolism ([place]) 08/15/2015  . Bilateral pneumonia  08/15/2015  . Nausea 08/15/2015  . Shortness of breath 07/31/2015  . Pleural effusion, bilateral 07/31/2015  . Cough 07/31/2015  . Chronic pain of multiple sites 06/05/2015  . Chronic lumbosacral pain 06/05/2015  . Esophageal spasm 06/05/2015  . History of DVT (deep vein thrombosis) 02/25/2015  . 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 Constipation 12/12/2014  . Coronary vasospasm ([place]) 12/09/2014  . History of pulmonary embolus (PE) 09/19/2014  . Impingement syndrome of shoulder 08/30/2014  . Impingement syndrome of left shoulder 08/30/2014  . Injury of superior glenoid labrum of shoulder joint 08/16/2014  . Supraspinatus tenosynovitis 08/16/2014  . Cervical spondylosis without myelopathy 08/16/2014  . Anxiety and depression 04/13/2014  . Osteoarthritis of hip (Left) 04/11/2014  . S/P THR (total hip replacement) (Left) 04/11/2014  . IBS (irritable bowel syndrome) 04/11/2014  . GERD (gastroesophageal reflux disease) 04/11/2014  . Insomnia 04/11/2014  . Essential hypertension, benign 10/28/2013  . Left hip pain 10/28/2013  . Anemia 10/28/2013  . HLD (hyperlipidemia) 10/28/2013    Past Surgical History:  Procedure Laterality Date  . ABDOMINAL HYSTERECTOMY  1985  . ANTERIOR CERVICAL DECOMP/DISCECTOMY FUSION  10/2012  . APPENDECTOMY  1985  . BACK SURGERY    . BREAST CYST EXCISION Right   . BREAST EXCISIONAL BIOPSY Right YRS AGO   NEG  . CARDIAC CATHETERIZATION   2000's; 2009  . CHOLECYSTECTOMY  2002  . EXCISION/RELEASE BURSA HIP Right 12/1984  . HERNIA REPAIR  1985  . LEFT HEART CATHETERIZATION WITH CORONARY ANGIOGRAM N/A 12/09/2014   Procedure: LEFT HEART CATHETERIZATION WITH CORONARY ANGIOGRAM;  Surgeon: Burnell Blanks, MD;  Location: [facility] CATH LAB;  Service: Cardiovascular;  Laterality: N/A;  . TONSILLECTOMY AND ADENOIDECTOMY  ~ 1968  . TOTAL HIP ARTHROPLASTY Left 04-06-2014  . UMBILICAL HERNIA REPAIR  1985  . VENA CAVA FILTER PLACEMENT  03/2014    OB History    No data  available       Home Medications    Prior to Admission medications   Medication Sig Start Date End Date Taking? Authorizing Provider  calcium carbonate (TUMS - DOSED IN MG ELEMENTAL CALCIUM) 500 MG chewable tablet Chew 1 tablet by mouth daily as needed for indigestion or heartburn.    Historical Provider, MD  carvedilol (COREG) 3.125 MG tablet Take 1 tablet (3.125 mg total) by mouth 2 (two) times daily with a meal. 10/22/15   Burnell Blanks, MD  ciprofloxacin (CIPRO) 500 MG tablet Take 1 tablet (500 mg total) by mouth 2 (two) times daily. 04/05/16 04/15/16  Nance Pear, MD  cyclobenzaprine (FLEXERIL) 10 MG tablet Take 1 tablet (10 mg total) by mouth 3 (three) times daily as needed for muscle spasms. 08/13/15   [name], MD  dicyclomine (BENTYL) 10 MG capsule TAKE 2 CAPSULES BY MOUTH EVERY SIX HOURS Patient taking differently: TAKE 1 CAPSULES BY MOUTH THREE TIMES DAILY 04/09/15   [name], MD  diltiazem (CARDIZEM CD) 120 MG 24 hr capsule Take 120 mg by mouth daily. 12/26/15   Historical Provider, MD  diltiazem (CARDIZEM SR) 60 MG 12 hr capsule Take 1 capsule (60 mg total) by mouth every 6 (six) hours as needed. 02/05/16   Manus Gunning, MD  diphenhydrAMINE (BENADRYL) 25 MG tablet Take 1-2 tablets (25-50 mg total) by mouth every 6 (six) hours as needed for itching ([place]). 07/14/15   Kelly Humes, PA-C  ELIQUIS 5 MG TABS tablet TAKE 1 TABLET (5 MG TOTAL) BY MOUTH 2 (TWO) TIMES DAILY. 03/26/16   [name], MD  EPINEPHrine 0.3 mg/0.3 mL IJ SOAJ injection Inject 0.3 mLs (0.3 mg total) into the muscle once. 10/12/15   [name], MD  escitalopram (LEXAPRO) 10 MG tablet TAKE 1 TABLET BY MOUTH EVERY DAY 10/01/15   [name], MD  furosemide (LASIX) 20 MG tablet TAKE 1 TABLET BY MOUTH EVERY DAY Patient taking differently: TAKE 1 TABLET BY MOUTH EVERY DAY prn 08/14/15   [name], MD  gabapentin (NEURONTIN) 600 MG tablet Take 0.5 tablets (300 mg total) by  mouth every 8 (eight) hours. 01/24/16   [name], MD  isosorbide mononitrate (IMDUR) 30 MG 24 hr tablet TAKE 1 TABLET BY MOUTH EVERY DAY 04/04/16   [name], MD  metroNIDAZOLE (FLAGYL) 500 MG tablet Take 1 tablet (500 mg total) by mouth 3 (three) times daily. 04/05/16 04/15/16  Nance Pear, MD  Multiple Vitamin (MULTIVITAMIN WITH MINERALS) TABS tablet Take 2 tablets by mouth daily.     Historical Provider, MD  ondansetron (ZOFRAN) 4 MG tablet Take 1 tablet (4 mg total) by mouth every 8 (eight) hours as needed for nausea or vomiting. 08/15/15   [name], MD  oxyCODONE-acetaminophen (PERCOCET/ROXICET) 5-325 MG tablet Take 1 tablet by mouth 2 (two) times daily as needed for severe pain. 02/18/16   [name], MD  pantoprazole (PROTONIX) 40 MG tablet TAKE ONE TABLET BY MOUTH EVERY DAY 09/03/15   [name], MD  potassium chloride (K-DUR) 10 MEQ tablet Take 1 tablet (10 mEq total) by mouth 2 (two) times daily. Patient taking differently: Take 10 mEq by mouth 2 (two) times daily as needed.  10/22/15   Burnell Blanks, MD  traZODone (DESYREL) 50 MG tablet Take 1 tablet (50 mg total) by mouth at bedtime. Patient taking differently: Take 50 mg by mouth at bedtime as needed for sleep.  04/24/15   [name], MD  triamcinolone (NASACORT AQ) 55 MCG/ACT AERO nasal inhaler Place 2 sprays into the nose daily. 10/02/15   Wilhelmina Mcardle, MD    Family History Family History  Problem Relation Age of Onset  . Stroke Mother     Deceased  . Lung cancer Father     Deceased  . Healthy Daughter   . Healthy Son     x 2  . Other Son     Suicide  . Heart attack Neg Hx     Social History Social History  Substance Use Topics  . Smoking status: Former Smoker    Packs/day: 1.50    Years: 10.00    Types: Cigarettes    Quit date: 04/12/1979  . Smokeless tobacco: Never Used  . Alcohol use No     Allergies   Abilify [aripiprazole]; Augmentin [amoxicillin-pot  clavulanate]; Bactrim [sulfamethoxazole-trimethoprim]; Ceftin [cefuroxime axetil]; Coconut oil; Diclofenac; Dye fdc red [red dye]; Indocin [indomethacin]; Linaclotide; Lisinopril; Morphine; Morphine and related; Simvastatin; Sulfa antibiotics; Sulfamethoxazole-trimethoprim; Wellbutrin [  bupropion]; and Quetiapine fumarate   Review of Systems Review of Systems  10 systems reviewed and all are negative for acute change except as noted in the HPI.   Physical Exam Updated Vital Signs BP 124/67 (BP Location: Right Arm)   Pulse 94   Temp 98.4 F (36.9 C) (Oral)   Resp 18   Ht 5\' 6"  (1.676 m)   Wt 208 lb (94.3 kg)   SpO2 98%   BMI 33.57 kg/m   Physical Exam  Constitutional: She is oriented to person, place, and time. She appears well-developed and well-nourished. No distress.  HENT:  Head: Normocephalic and atraumatic.  Nose: Nose normal.  Mouth/Throat: Oropharynx is clear and moist. No oropharyngeal exudate.  Eyes: Conjunctivae and EOM are normal. Pupils are equal, round, and reactive to light. No scleral icterus.  Neck: Normal range of motion. Neck supple. No JVD present. No tracheal deviation present. No thyromegaly present.  Cardiovascular: Normal rate, regular rhythm and normal heart sounds.  Exam reveals no gallop and no friction rub.   No murmur heard. Pulmonary/Chest: Effort normal and breath sounds normal. No respiratory distress. She has no wheezes. She exhibits no tenderness.  Abdominal: Soft. Bowel sounds are normal. She exhibits no distension and no mass. There is no tenderness. There is no rebound and no guarding.  Musculoskeletal: Normal range of motion. She exhibits no edema or tenderness.  Lymphadenopathy:    She has no cervical adenopathy.  Neurological: She is alert and oriented to person, place, and time. No cranial nerve deficit. She exhibits normal muscle tone.  Skin: Skin is warm and dry. No rash noted. No pallor.  2 insects bites notes to the right shoulder     Nursing note and vitals reviewed.    ED Treatments / Results  DIAGNOSTIC STUDIES:  Oxygen Saturation is 98% on RA, normal by my interpretation.    COORDINATION OF CARE:  2:04 AM Discussed treatment plan with pt at bedside and pt agreed to plan.  Labs (all labs ordered are listed, but only abnormal results are displayed) Labs Reviewed  CBC WITH DIFFERENTIAL/PLATELET - Abnormal; Notable for the following:       Result Value   Hemoglobin 11.6 (*)    HCT 35.5 (*)    Platelets 404 (*)    All other components within normal limits  COMPREHENSIVE METABOLIC PANEL - Abnormal; Notable for the following:    Glucose, Bld 112 (*)    All other components within normal limits  URINALYSIS, ROUTINE W REFLEX MICROSCOPIC (NOT AT [facility]) - Abnormal; Notable for the following:    Color, Urine AMBER (*)    Ketones, ur 15 (*)    All other components within normal limits  LIPASE, BLOOD  I-STAT TROPOININ, ED    EKG  EKG Interpretation  Date/Time:  Saturday April 05 2016 23:38:48 EDT Ventricular Rate:  91 PR Interval:  152 QRS Duration: 86 QT Interval:  374 QTC Calculation: 460 R Axis:   21 Text Interpretation:  Normal sinus rhythm Normal ECG No significant change since last tracing Confirmed by Glynn Octave [phone]) on 04/06/2016 1:24:21 AM       Radiology Ct Abdomen Pelvis W Contrast  Result Date: 04/05/2016 CLINICAL DATA:  Constipation for 5 days. EXAM: CT ABDOMEN AND PELVIS WITH CONTRAST TECHNIQUE: Multidetector CT imaging of the abdomen and pelvis was performed using the standard protocol following bolus administration of intravenous contrast. CONTRAST:  165mL ISOVUE-300 IOPAMIDOL (ISOVUE-300) INJECTION 61% COMPARISON:  10/28/2014 FINDINGS: Lower chest:  The lung bases appear clear. Hepatobiliary: Unremarkable appearance of the liver. Previous cholecystectomy. No biliary dilatation. Pancreas: No mass, inflammatory changes, or other significant abnormality. Spleen: Within normal  limits in size and appearance. Adrenals/Urinary Tract: Normal appearance of the adrenal glands. No suspicious kidney abnormality. No hydronephrosis. The urinary bladder appears normal. Stomach/Bowel: Small hiatal hernia. The small bowel loops have a normal course and caliber without evidence for obstruction. There is a moderate amount of liquid and solid stool identified throughout the colon up to the level of the rectum. No pathologic dilatation of the colon identified. Mild wall thickening involving the descending colon and sigmoid colon noted. No evidence for bowel perforation or abscess. A moderate amount of desiccated stool is identified within the rectum. This may reflect rectal impaction. Vascular/Lymphatic: Normal appearance of the abdominal aorta. Calcified atherosclerotic disease involves the abdominal aorta. No aneurysm. Filter is identified within the inferior vena cava. No upper abdominal or pelvic adenopathy identified. Reproductive: No mass or other significant abnormality. Other: None. Musculoskeletal: Previous left hip arthroplasty. No acute bone abnormalities noted. IMPRESSION: 1. There is a moderate amount of liquid in solid stool throughout the colon. There is a moderate amount of desiccated stool within the rectum which may reflect rectal impaction. Additionally, there is mild wall thickening involving the descending colon and sigmoid colon which may reflect underlying mild colitis versus stercoral colitis. 2. Aortic atherosclerosis. Electronically Signed   By: Kerby Moors M.D.   On: 04/05/2016 11:23   Dg Abdomen Acute W/chest  Result Date: 04/05/2016 CLINICAL DATA:  Constipation, last bowel movement 5 days ago. EXAM: DG ABDOMEN ACUTE W/ 1V CHEST COMPARISON:  Chest radiograph March 26, 2016 FINDINGS: Cardiomediastinal silhouette is normal. Lungs are clear, no pleural effusions. No pneumothorax. Soft tissue planes and included osseous structures are unremarkable. A few small bowel air-fluid  levels without distention. Paucity of large bowel gas. Moderate amount of retained large bowel stool . Surgical clips in the included right abdomen compatible with cholecystectomy. No intra-abdominal mass effect, pathologic calcifications or free air. Soft tissue planes and included osseous structures are non-suspicious. LEFT hip total arthroplasty. IMPRESSION: Normal chest. Nonspecific bowel gas pattern, paucity of large bowel gas. Moderate amount of retained large bowel stool. Electronically Signed   By: Elon Alas M.D.   On: 04/05/2016 06:40    Procedures Procedures (including critical care time)  Medications Ordered in ED Medications  sucralfate (CARAFATE) 1 GM/10ML suspension 1 g (1 g Oral Given 04/06/16 0154)     Initial Impression / Assessment and Plan / ED Course  I have reviewed the triage vital signs and the nursing notes.  Pertinent labs & imaging results that were available during my care of the patient were reviewed by me and considered in my medical decision making (see chart for details).  Clinical Course    Patient presents to the emergency department for abdominal pain that has continued since her last emergency visit at [facility]. She did not fill her prescription antibiotics. She is advised to do so. Physical exam is unremarkable. Laboratory studies are also normal including a white count that is now normal when compared to her last draw one week ago. She appears well in no acute distress. She was given sucralfate in the emergency department. Vital signs were within her normal limits and she is safe for discharge.  Final Clinical Impressions(s) / ED Diagnoses   Final diagnoses:  None    New Prescriptions New Prescriptions   No medications on file  I personally performed the services described in this documentation, which was scribed in my presence. The recorded information has been reviewed and is accurate.      Everlene Balls, MD 04/06/16 [phone]

## 2016-04-07 ENCOUNTER — Telehealth: Payer: Self-pay | Admitting: *Deleted

## 2016-04-07 NOTE — Telephone Encounter (Signed)
Patient was seen in the emergency room over the weekend,  She will need a follow up, she was diagnosed with colitis. She requested to see Dr. Caryl Bis today, for vomiting and diarrhea.

## 2016-04-07 NOTE — Telephone Encounter (Signed)
There is a hospital follow up appt blocked at 11:30 that you can use.

## 2016-04-08 ENCOUNTER — Encounter: Payer: Self-pay | Admitting: Family Medicine

## 2016-04-08 ENCOUNTER — Ambulatory Visit (INDEPENDENT_AMBULATORY_CARE_PROVIDER_SITE_OTHER): Payer: Medicare Other | Admitting: Family Medicine

## 2016-04-08 DIAGNOSIS — F329 Major depressive disorder, single episode, unspecified: Secondary | ICD-10-CM

## 2016-04-08 DIAGNOSIS — I201 Angina pectoris with documented spasm: Secondary | ICD-10-CM

## 2016-04-08 DIAGNOSIS — F418 Other specified anxiety disorders: Secondary | ICD-10-CM

## 2016-04-08 DIAGNOSIS — K529 Noninfective gastroenteritis and colitis, unspecified: Secondary | ICD-10-CM | POA: Diagnosis not present

## 2016-04-08 DIAGNOSIS — F419 Anxiety disorder, unspecified: Secondary | ICD-10-CM

## 2016-04-08 DIAGNOSIS — F32A Depression, unspecified: Secondary | ICD-10-CM

## 2016-04-08 HISTORY — DX: Noninfective gastroenteritis and colitis, unspecified: K52.9

## 2016-04-08 MED ORDER — ESCITALOPRAM OXALATE 10 MG PO TABS: 10.0000 mg | ORAL_TABLET | Freq: Every day | ORAL | 5 refills | Status: DC

## 2016-04-08 NOTE — Progress Notes (Signed)
[name], MD Phone: [phone]  Valerie Vazquez is a 60 y.o. female who presents today for follow-up.  Patient presents for emergency room follow-up. Evaluated there twice over the weekend for abdominal discomfort. Notes her abdominal discomfort started on Friday evening with cramping and then woke up with severe lower abdominal discomfort on Saturday. Went to the ED and had a CT scan that showed significant constipation and possible colitis. She was prescribed ciprofloxacin and Flagyl though she did not start these. She had continued pain on Sunday and went back to the emergency room. They advised her to start her antibiotics though she still has not started these antibiotics. Otherwise her workup was overall reassuring. She notes continued discomfort in her lower abdomen that is described as soreness. She notes this worsens somewhat with eating. Did have a bowel movement this morning that was the size of a finger. Reports last colonoscopy in 2014 was normal. No fevers. No vomiting or diarrhea. No blood in her stool. Some mild nausea. She reports a history of hysterectomy and appendectomy. Still has her ovaries.  Depression: Patient notes this is not that well controlled currently and is slightly worse than previously. She is on Lexapro 10 mg. Has had some depression and decreased appetite. She recently lost her significant other and is had some thoughts of whether or not it is her time to go as well though has no intent or plan to harm herself. She did not contact hospice for grief counseling at her last visit.  PMH: Former smoker   ROS see history of present illness  Objective  Physical Exam Vitals:   04/08/16 1123  BP: 132/84  Pulse: 92  Temp: 98.1 F (36.7 C)    BP Readings from Last 3 Encounters:  04/08/16 132/84  04/06/16 118/67  04/05/16 (!) 143/85   Wt Readings from Last 3 Encounters:  04/08/16 200 lb 12.8 oz (91.1 kg)  04/05/16 208 lb (94.3 kg)  04/05/16  208 lb (94.3 kg)    Physical Exam  Constitutional: No distress.  HENT:  Head: Normocephalic and atraumatic.  Cardiovascular: Normal rate, regular rhythm and normal heart sounds.   Pulmonary/Chest: Effort normal.  Abdominal: Soft. Bowel sounds are normal. She exhibits no distension. There is tenderness (suprapubic tenderness noted mild in nature described as a soreness by patient). There is no rebound and no guarding.  Musculoskeletal: She exhibits no edema.  Neurological: She is alert. Gait normal.  Skin: Skin is warm and dry. She is not diaphoretic.  Psychiatric:  Mood depressed, affect flat     Assessment/Plan: Please see individual problem list.  Colitis Patient with continued symptoms of abdominal discomfort that could be related to colitis. Had findings of colitis on CT scan over the weekend. She has not started her antibiotics yet. Symptoms could additionally be related to severe constipation. She has some tenderness in the lower abdomen that she describes as soreness though otherwise abdominal exam is benign. No nausea or vomiting. Did pass a bowel movement today. Discussed that she needs to take the antibiotics as prescribed in the ED. When she has taken these and her possible infection is treated we could consider treatment of her constipation. Discussed liquid and bland diet. If pain with eating she should not eat that kind of food. If worsens she'll follow-up or seek medical attention. She'll be seen back on Friday for recheck. She is given return precautions.  Anxiety and depression Depression is slightly worse than previously. She does wonder if it is  not her time to pass away though has no intent or plan to harm herself. Discussed increasing Lexapro though she declined. Advised once again that she needs to seek therapy and provided her with the contact information for grief counseling through hospice and encouraged her to contact them. She's given return precautions.   No  orders of the defined types were placed in this encounter.   Meds ordered this encounter  Medications  . escitalopram (LEXAPRO) 10 MG tablet    Sig: Take 1 tablet (10 mg total) by mouth daily.    Dispense:  30 tablet    Refill:  [place], MD [place]

## 2016-04-08 NOTE — Assessment & Plan Note (Signed)
Depression is slightly worse than previously. She does wonder if it is not her time to pass away though has no intent or plan to harm herself. Discussed increasing Lexapro though she declined. Advised once again that she needs to seek therapy and provided her with the contact information for grief counseling through hospice and encouraged her to contact them. She's given return precautions.

## 2016-04-08 NOTE — Assessment & Plan Note (Addendum)
Patient with continued symptoms of abdominal discomfort that could be related to colitis. Had findings of colitis on CT scan over the weekend. She has not started her antibiotics yet. Symptoms could additionally be related to severe constipation. She has some tenderness in the lower abdomen that she describes as soreness though otherwise abdominal exam is benign. No nausea or vomiting. Did pass a bowel movement today. Discussed that she needs to take the antibiotics as prescribed in the ED. When she has taken these and her possible infection is treated we could consider treatment of her constipation. Discussed liquid and bland diet. If pain with eating she should not eat that kind of food. If worsens she'll follow-up or seek medical attention. She'll be seen back on Friday for recheck. She is given return precautions.

## 2016-04-08 NOTE — Progress Notes (Signed)
Pre visit review using our clinic review tool, if applicable. No additional management support is needed unless otherwise documented below in the visit note. 

## 2016-04-08 NOTE — Assessment & Plan Note (Deleted)
Depression is slightly worse than previously.

## 2016-04-08 NOTE — Patient Instructions (Signed)
Nice to see you. I'm advising that you start the ciprofloxacin and Flagyl to treat the possible infection. We will refill your Lexapro and I think it will be a good idea for you to contact hospice for grief counseling. If you develop worsening abdominal pain, or develop diarrhea, vomiting, blood in your stool, fevers, worsening depression, intent or plan to harm your self, or any new or change in symptoms please seek medical attention.

## 2016-04-11 ENCOUNTER — Emergency Department
Admission: EM | Admit: 2016-04-11 | Discharge: 2016-04-11 | Disposition: A | Discharge status: Home / Self Care | Payer: Medicare Other | Attending: Emergency Medicine | Admitting: Emergency Medicine

## 2016-04-11 ENCOUNTER — Encounter: Payer: Self-pay | Admitting: *Deleted

## 2016-04-11 ENCOUNTER — Ambulatory Visit (INDEPENDENT_AMBULATORY_CARE_PROVIDER_SITE_OTHER): Payer: Medicare Other | Admitting: Family Medicine

## 2016-04-11 DIAGNOSIS — K529 Noninfective gastroenteritis and colitis, unspecified: Secondary | ICD-10-CM | POA: Diagnosis not present

## 2016-04-11 DIAGNOSIS — G8929 Other chronic pain: Secondary | ICD-10-CM | POA: Diagnosis not present

## 2016-04-11 DIAGNOSIS — I201 Angina pectoris with documented spasm: Secondary | ICD-10-CM

## 2016-04-11 DIAGNOSIS — Z79899 Other long term (current) drug therapy: Secondary | ICD-10-CM | POA: Diagnosis not present

## 2016-04-11 DIAGNOSIS — E119 Type 2 diabetes mellitus without complications: Secondary | ICD-10-CM | POA: Insufficient documentation

## 2016-04-11 DIAGNOSIS — I10 Essential (primary) hypertension: Secondary | ICD-10-CM | POA: Diagnosis not present

## 2016-04-11 DIAGNOSIS — N3090 Cystitis, unspecified without hematuria: Secondary | ICD-10-CM | POA: Diagnosis not present

## 2016-04-11 DIAGNOSIS — Z87891 Personal history of nicotine dependence: Secondary | ICD-10-CM | POA: Diagnosis not present

## 2016-04-11 DIAGNOSIS — Z792 Long term (current) use of antibiotics: Secondary | ICD-10-CM | POA: Insufficient documentation

## 2016-04-11 DIAGNOSIS — N390 Urinary tract infection, site not specified: Secondary | ICD-10-CM | POA: Insufficient documentation

## 2016-04-11 DIAGNOSIS — R1012 Left upper quadrant pain: Secondary | ICD-10-CM

## 2016-04-11 LAB — URINALYSIS COMPLETE WITH MICROSCOPIC (ARMC ONLY)
Bilirubin Urine: NEGATIVE
Glucose, UA: NEGATIVE mg/dL
Hgb urine dipstick: NEGATIVE
Nitrite: NEGATIVE
Protein, ur: 100 mg/dL — AB
Specific Gravity, Urine: 1.032 — ABNORMAL HIGH (ref 1.005–1.030)
pH: 5 (ref 5.0–8.0)

## 2016-04-11 LAB — CBC
HCT: 33.5 % — ABNORMAL LOW (ref 35.0–47.0)
Hemoglobin: 12.1 g/dL (ref 12.0–16.0)
MCH: 31.9 pg (ref 26.0–34.0)
MCHC: 36.2 g/dL — ABNORMAL HIGH (ref 32.0–36.0)
MCV: 88.1 fL (ref 80.0–100.0)
Platelets: 354 10*3/uL (ref 150–440)
RBC: 3.8 MIL/uL (ref 3.80–5.20)
RDW: 14.4 % (ref 11.5–14.5)
WBC: 8.4 10*3/uL (ref 3.6–11.0)

## 2016-04-11 LAB — COMPREHENSIVE METABOLIC PANEL
ALT: 13 U/L — ABNORMAL LOW (ref 14–54)
AST: 20 U/L (ref 15–41)
Albumin: 4 g/dL (ref 3.5–5.0)
Alkaline Phosphatase: 84 U/L (ref 38–126)
Anion gap: 6 (ref 5–15)
BUN: 7 mg/dL (ref 6–20)
CO2: 26 mmol/L (ref 22–32)
Calcium: 9.2 mg/dL (ref 8.9–10.3)
Chloride: 108 mmol/L (ref 101–111)
Creatinine, Ser: 0.91 mg/dL (ref 0.44–1.00)
GFR calc Af Amer: >60 mL/min (ref >60)
GFR calc non Af Amer: >60 mL/min (ref >60)
Glucose, Bld: 116 mg/dL — ABNORMAL HIGH (ref 65–99)
Potassium: 3.3 mmol/L — ABNORMAL LOW (ref 3.5–5.1)
Sodium: 140 mmol/L (ref 135–145)
Total Bilirubin: 0.4 mg/dL (ref 0.3–1.2)
Total Protein: 7.2 g/dL (ref 6.5–8.1)

## 2016-04-11 LAB — LIPASE, BLOOD: Lipase: 13 U/L (ref 11–51)

## 2016-04-11 MED ORDER — NITROFURANTOIN MONOHYD MACRO 100 MG PO CAPS
100.0000 mg | ORAL_CAPSULE | Freq: Once | ORAL | Status: AC
  Administered 2016-04-11: 100 mg via ORAL
  Filled 2016-04-11: qty 1

## 2016-04-11 MED ORDER — NITROFURANTOIN MONOHYD MACRO 100 MG PO CAPS: 100.0000 mg | ORAL_CAPSULE | Freq: Two times a day (BID) | ORAL | 0 refills | Status: DC

## 2016-04-11 MED ORDER — DICYCLOMINE HCL 20 MG PO TABS
20.0000 mg | ORAL_TABLET | Freq: Once | ORAL | Status: DC
  Filled 2016-04-11: qty 1

## 2016-04-11 MED ORDER — DICYCLOMINE HCL 20 MG PO TABS: 20.0000 mg | ORAL_TABLET | Freq: Three times a day (TID) | ORAL | 0 refills | Status: DC | PRN

## 2016-04-11 NOTE — ED Notes (Signed)
MD in to talk to patient.

## 2016-04-11 NOTE — Progress Notes (Signed)
Pre visit review using our clinic review tool, if applicable. No additional management support is needed unless otherwise documented below in the visit note. 

## 2016-04-11 NOTE — ED Notes (Signed)
Discharge instructions reviewed with patient. Questions fielded by this RN. Patient verbalizes understanding of instructions. Patient discharged home in stable condition per Jimmye Norman MD . No acute distress noted at time of discharge.

## 2016-04-11 NOTE — ED Notes (Addendum)
Pt awaiting to talk to MD concerning Meds before discharge.

## 2016-04-11 NOTE — ED Notes (Signed)
Pt presents with LUQ abdominal pain starting yesterday. Pt is presently taking abx for colitis. Pt states tylenol not controlling pain. Pt characterizes her pain as intermittent and sharp worse on paplation. Pt states nauseated now but mostly controlled w/ zofran ODT. Pt c/o some loose stools starting Wed. Pt denies pain at this time. Pt is resting in bed no distress noted. Will continue to montior.

## 2016-04-11 NOTE — ED Triage Notes (Signed)
Pt c/o LUQ abdominal pain starting this morning. Pt is presently taking abx for colitis. Pt states OTC tylenol not controlling pain. Pt characterizes her pain as intermittent and sharp. Pt denies precipitating factors. Pt states nausea and it is controlled w/ zofran ODT. Pt c/o some loose stools starting Wed, but not watery diarrhea. Pt drove self to ED tonight. Pt denies pain at this time.

## 2016-04-11 NOTE — Assessment & Plan Note (Addendum)
Pain significantly improved at this time compared to when I saw her previously. Highly doubt she has a UTI given that there are no nitrites or blood on her urine dipstick and it revealed squamous epithelial cells and is likely contaminated. Plus she is already on ciprofloxacin. Discussed not filling the Macrobid. Continuing ciprofloxacin and Flagyl until completed. Pain could be related to colitis versus her significant constipation. Discussed referral back to GI for further evaluation. Referral will be placed. She is given return precautions.

## 2016-04-11 NOTE — Patient Instructions (Signed)
Nice to see you. I am glad you are feeling better. please finish the course of ciprofloxacin and Flagyl. I would not take the [place]. You can try Bentyl as well. You should start on MiraLAX. If you develop worsening pain, nausea, vomiting, blood in her stool, inability to pass gas, or any new or changing symptoms please seek medical attention.

## 2016-04-11 NOTE — Progress Notes (Signed)
  [name], MD Phone: [phone]  Valerie Vazquez is a 60 y.o. female who presents today for follow-up.  Patient seen today in follow-up from her visit earlier this week. Notes her abdominal pain and it worsened overnight last night and she went to the emergency room for evaluation again. They told her she may have had a UTI as well and placed her on Macrobid in addition to ciprofloxacin and Flagyl. UA had leukocytes and no nitrites or blood. Urine dipstick appeared to be contaminated with a fair number of squamous epithelial cells. She got 1 dose of Macrobid in the ED. She had been having left upper quadrant pain prior to going to the ED. No pain at this time. Had a bowel movement yesterday. Notes her bowel movements have been more soft and liquidy than previously. Passing some gas. Minimal nausea responsive to Zofran. Workup was reassuring. She notes her pain has significantly improved since last night.  ROS see history of present illness  Objective  Physical Exam Vitals:   04/11/16 1506  BP: 110/88  Pulse: 92  Resp: 16  Temp: 98.6 F (37 C)    BP Readings from Last 3 Encounters:  04/11/16 110/88  04/11/16 140/78  04/08/16 132/84   Wt Readings from Last 3 Encounters:  04/11/16 203 lb (92.1 kg)  04/11/16 200 lb (90.7 kg)  04/08/16 200 lb 12.8 oz (91.1 kg)    Physical Exam  Constitutional: No distress.  HENT:  Head: Normocephalic and atraumatic.  Cardiovascular: Normal rate, regular rhythm and normal heart sounds.   Pulmonary/Chest: Effort normal and breath sounds normal.  Abdominal: Soft. Bowel sounds are normal. She exhibits no distension. There is no tenderness. There is no rebound and no guarding.  Neurological: She is alert. Gait normal.  Skin: Skin is warm and dry. She is not diaphoretic.     Assessment/Plan: Please see individual problem list.  Colitis Pain significantly improved at this time compared to when I saw her previously. Highly doubt she  has a UTI given that there are no nitrites or blood on her urine dipstick and it revealed squamous epithelial cells and is likely contaminated. Plus she is already on ciprofloxacin. Discussed not filling the Macrobid. Continuing ciprofloxacin and Flagyl until completed. Pain could be related to colitis versus her significant constipation. Discussed referral back to GI for further evaluation. Referral will be placed. She is given return precautions.   Orders Placed This Encounter  Procedures  . Ambulatory referral to Gastroenterology    Referral Priority:   Routine    Referral Type:   Consultation    Referral Reason:   Specialty Services Required    Number of Visits Requested:   [place], MD [place]

## 2016-04-11 NOTE — ED Provider Notes (Addendum)
[facility] Emergency Department Provider Note        Time seen: ----------------------------------------- 3:57 AM on 04/11/2016 -----------------------------------------    I have reviewed the triage vital signs and the nursing notes.   HISTORY  Chief Complaint Abdominal Pain    HPI Valerie Vazquez is a 60 y.o. female who presents to ER with abdominal pain starting this morning. She is currently on antibiotics for colitis. She states over-the-counter Tylenol is not controlling her pain. She describes pain as intermittent and sharp. She has nausea but that is improved with Zofran. She has had some diarrhea starting Wednesday. She denies any pain at this time.Patient has seen gastroenterology once recently.   Past Medical History:  Diagnosis Date  . Anemia   . Bilateral renal cysts   . Chronic back pain    "upper and lower" (09/19/2014)  . Depression    "major" (09/19/2014)  . DVT (deep venous thrombosis) ([place])   . Esophageal spasm   . Gallstones   . Gastritis   . Gastroenteritis   . GERD (gastroesophageal reflux disease)   . Headache    "couple /month lately" (09/19/2014)  . High blood pressure   . High cholesterol   . History of blood transfusion 1985   related to hysterectomy  . Hypertension   . IBS (irritable bowel syndrome)   . Migraine    "a few times/yr" (09/19/2014)  . Myocardial infarction ([place]) 10/2010 X 3   "while hospitalized"  . Osteoarthritis    "qwhere; mostly around my joints" (09/19/2014)  . Pneumonia 04/2014  . PTSD (post-traumatic stress disorder)   . Pulmonary embolism ([place]) 04/2001; 09/19/2014   "after gallbladder OR; "  . Schizoaffective disorder ([place])   . Sleep apnea    "mild" (09/19/2014)  . Snoring    a. sleep study 5/16: No OSA  . Spinal stenosis   . Spondylosis   . Type II diabetes mellitus ([place])    "dx'd in 2007; lost weight; no RX for ~ 4 yr now" (09/19/2014)    Patient Active Problem List   Diagnosis  Date Noted  . Colitis 04/08/2016  . Tremor 03/19/2016  . Left shoulder pain 02/18/2016  . Abdominal distension 02/18/2016  . Absolute anemia 01/29/2016  . Chronic pain 01/29/2016  . Neurogenic pain 01/29/2016  . Musculoskeletal pain 01/29/2016  . Long term current use of opiate analgesic 01/29/2016  . Long term prescription opiate use 01/29/2016  . Opiate use (15 MME/Day) 01/29/2016  . Encounter for therapeutic drug level monitoring 01/29/2016  . Encounter for pain management planning 01/29/2016  . Chronic neck pain (midline) 01/29/2016  . Chronic chest wall pain 01/29/2016  . Chronic low back pain (Location of Secondary source of pain) (Bilateral) (R>L) 01/29/2016  . Disturbance of skin sensation 01/29/2016  . Chronic shoulder pain (Location of Primary Source of Pain) (Left) 01/29/2016  . Chronic hip pain (Location of Tertiary source of pain) (Bilateral) (R>L) 01/29/2016  . Osteoarthritis of hip (Bilateral) 01/29/2016  . Lumbar facet arthropathy (Severe at L2-3, L3-4, L4-5, and L5-S1) 01/29/2016  . Lumbar central spinal stenosis (moderate at L4-5; mild at L2-3 and L3-4) 01/29/2016  . Lumbar foraminal stenosis (L4-5) (Right) 01/29/2016  . Lumbar facet syndrome 01/29/2016  . Hx of cervical spine surgery 01/29/2016  . Numbness 11/19/2015  . Right wrist pain 11/09/2015  . Breast pain 10/19/2015  . Chest pain 10/14/2015  . Headache 10/14/2015  . Abdominal pain 10/14/2015  . Lip swelling 10/14/2015  . Pulmonary  embolism ([place]) 08/15/2015  . Bilateral pneumonia 08/15/2015  . Nausea 08/15/2015  . Shortness of breath 07/31/2015  . Pleural effusion, bilateral 07/31/2015  . Cough 07/31/2015  . Chronic pain of multiple sites 06/05/2015  . Chronic lumbosacral pain 06/05/2015  . Esophageal spasm 06/05/2015  . History of DVT (deep vein thrombosis) 02/25/2015  . 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 Constipation 12/12/2014  . Coronary vasospasm ([place]) 12/09/2014  . History of pulmonary embolus  (PE) 09/19/2014  . Impingement syndrome of shoulder 08/30/2014  . Impingement syndrome of left shoulder 08/30/2014  . Injury of superior glenoid labrum of shoulder joint 08/16/2014  . Supraspinatus tenosynovitis 08/16/2014  . Cervical spondylosis without myelopathy 08/16/2014  . Anxiety and depression 04/13/2014  . Osteoarthritis of hip (Left) 04/11/2014  . S/P THR (total hip replacement) (Left) 04/11/2014  . IBS (irritable bowel syndrome) 04/11/2014  . GERD (gastroesophageal reflux disease) 04/11/2014  . Insomnia 04/11/2014  . Essential hypertension, benign 10/28/2013  . Left hip pain 10/28/2013  . Anemia 10/28/2013  . HLD (hyperlipidemia) 10/28/2013    Past Surgical History:  Procedure Laterality Date  . ABDOMINAL HYSTERECTOMY  1985  . ANTERIOR CERVICAL DECOMP/DISCECTOMY FUSION  10/2012  . APPENDECTOMY  1985  . BACK SURGERY    . BREAST CYST EXCISION Right   . BREAST EXCISIONAL BIOPSY Right YRS AGO   NEG  . CARDIAC CATHETERIZATION   2000's; 2009  . CHOLECYSTECTOMY  2002  . EXCISION/RELEASE BURSA HIP Right 12/1984  . HERNIA REPAIR  1985  . LEFT HEART CATHETERIZATION WITH CORONARY ANGIOGRAM N/A 12/09/2014   Procedure: LEFT HEART CATHETERIZATION WITH CORONARY ANGIOGRAM;  Surgeon: Burnell Blanks, MD;  Location: [facility] CATH LAB;  Service: Cardiovascular;  Laterality: N/A;  . TONSILLECTOMY AND ADENOIDECTOMY  ~ 1968  . TOTAL HIP ARTHROPLASTY Left 04-06-2014  . UMBILICAL HERNIA REPAIR  1985  . VENA CAVA FILTER PLACEMENT  03/2014    Allergies Abilify [aripiprazole]; Augmentin [amoxicillin-pot clavulanate]; Bactrim [sulfamethoxazole-trimethoprim]; Ceftin [cefuroxime axetil]; Coconut oil; Diclofenac; Dye fdc red [red dye]; Indocin [indomethacin]; Linaclotide; Lisinopril; Morphine; Morphine and related; Simvastatin; Sulfa antibiotics; Sulfamethoxazole-trimethoprim; Wellbutrin [bupropion]; and Quetiapine fumarate  Social History Social History  Substance Use Topics  . Smoking status:  Former Smoker    Packs/day: 1.50    Years: 10.00    Types: Cigarettes    Quit date: 04/12/1979  . Smokeless tobacco: Never Used  . Alcohol use No    Review of Systems Constitutional: Negative for fever. Cardiovascular: Negative for chest pain. Respiratory: Negative for shortness of breath. Gastrointestinal: Positive for abdominal pain, occasional diarrhea Genitourinary: Negative for dysuria. Musculoskeletal: Negative for back pain. Skin: Negative for rash. Neurological: Negative for headaches, focal weakness or numbness.  10-point ROS otherwise negative.  ____________________________________________   PHYSICAL EXAM:  VITAL SIGNS: ED Triage Vitals  Enc Vitals Group     BP 04/11/16 0205 131/72     Pulse Rate 04/11/16 0205 75     Resp 04/11/16 0205 20     Temp 04/11/16 0205 98.2 F (36.8 C)     Temp Source 04/11/16 0205 Oral     SpO2 04/11/16 0205 99 %     Weight 04/11/16 0205 200 lb (90.7 kg)     Height 04/11/16 0205 5\' 6"  (1.676 m)     Head Circumference --      Peak Flow --      Pain Score 04/11/16 0206 0     Pain Loc --      Pain Edu? --  Excl. in GC? --    EKG: Interpreted by me, sinus rhythm rate 83 bpm, normal PR interval, normal QRS size, normal QT interval. Possible septal infarct age indeterminate.  Constitutional: Alert and oriented. Well appearing and in no distress. Eyes: Conjunctivae are normal. PERRL. Normal extraocular movements. ENT   Head: Normocephalic and atraumatic.   Nose: No congestion/rhinnorhea.   Mouth/Throat: Mucous membranes are moist.   Neck: No stridor. Cardiovascular: Normal rate, regular rhythm. No murmurs, rubs, or gallops. Respiratory: Normal respiratory effort without tachypnea nor retractions. Breath sounds are clear and equal bilaterally. No wheezes/rales/rhonchi. Gastrointestinal: Soft and nontender. Normal bowel sounds Rectal: No hemorrhoids, nontender, no impaction Musculoskeletal: Nontender with normal  range of motion in all extremities. No lower extremity tenderness nor edema. Neurologic:  Normal speech and language. No gross focal neurologic deficits are appreciated.  Skin:  Skin is warm, dry and intact. No rash noted. Psychiatric: Mood and affect are normal. Speech and behavior are normal.  ____________________________________________  ED COURSE:  Pertinent labs & imaging results that were available during my care of the patient were reviewed by me and considered in my medical decision making (see chart for details). Clinical Course  Patient is in no acute distress, will check basic labs and urinalysis.  Procedures ____________________________________________   LABS (pertinent positives/negatives)  Labs Reviewed  COMPREHENSIVE METABOLIC PANEL - Abnormal; Notable for the following:       Result Value   Potassium 3.3 (*)    Glucose, Bld 116 (*)    ALT 13 (*)    All other components within normal limits  CBC - Abnormal; Notable for the following:    HCT 33.5 (*)    MCHC 36.2 (*)    All other components within normal limits  URINALYSIS COMPLETEWITH MICROSCOPIC ([facility] ONLY) - Abnormal; Notable for the following:    Color, Urine AMBER (*)    APPearance CLOUDY (*)    Ketones, ur TRACE (*)    Specific Gravity, Urine 1.032 (*)    Protein, ur 100 (*)    Leukocytes, UA 2+ (*)    Bacteria, UA FEW (*)    Squamous Epithelial / LPF 6-30 (*)    All other components within normal limits  LIPASE, BLOOD   ____________________________________________  FINAL ASSESSMENT AND PLAN  Acute on chronic abdominal pain, cystitis  Plan: Patient with labs and imaging as dictated above. Patient was having left upper quadrant abdominal pain of uncertain etiology. She does need urgent gastroenterology outpatient follow-up, I'll place her on Macrobid for UTI. She is stable for discharge at this time. I will prescribe pain medicine she can take as needed for the abdominal pain.   Earleen Newport, MD   Note: This dictation was prepared with Dragon dictation. Any transcriptional errors that result from this process are unintentional    Earleen Newport, MD 04/11/16 [id number]    Earleen Newport, MD 04/11/16 [id number]

## 2016-04-11 NOTE — ED Notes (Signed)
MD at bedside. 

## 2016-04-16 ENCOUNTER — Encounter: Payer: Self-pay | Admitting: Family Medicine

## 2016-04-16 ENCOUNTER — Ambulatory Visit (INDEPENDENT_AMBULATORY_CARE_PROVIDER_SITE_OTHER): Payer: Medicare Other | Admitting: Family Medicine

## 2016-04-16 DIAGNOSIS — R21 Rash and other nonspecific skin eruption: Secondary | ICD-10-CM | POA: Diagnosis not present

## 2016-04-16 DIAGNOSIS — I201 Angina pectoris with documented spasm: Secondary | ICD-10-CM

## 2016-04-16 MED ORDER — MUPIROCIN 2 % EX OINT: 1.0000 "application " | TOPICAL_OINTMENT | Freq: Three times a day (TID) | CUTANEOUS | 0 refills | Status: DC

## 2016-04-16 NOTE — Assessment & Plan Note (Signed)
Rash is somewhat nonspecific, though with golden crusting and underlying erythema could be impetigo particularly given location. Doubt cellulitis. No signs of abscess. No signs of cyst. We will treat with topical mupirocin to see if this will benefit the rash. If no improvement over the next several days would consider oral antibiotics when she is off of her current ciprofloxacin and Flagyl. Given return precautions.

## 2016-04-16 NOTE — Patient Instructions (Signed)
Nice to see you. The area on your chin could be impetigo. We will treat you with topical mupirocin. If this does not begin to improve in the next 2 days please let us know and we can consider an oral antibiotic once you are off of your current antibiotics. If you develop any new symptoms you will need to be reevaluated. If you develop fever, chills, spreading redness, or any new or changing symptoms please seek medical attention.

## 2016-04-16 NOTE — Progress Notes (Signed)
Pre visit review using our clinic review tool, if applicable. No additional management support is needed unless otherwise documented below in the visit note. 

## 2016-04-16 NOTE — Progress Notes (Signed)
  [name], MD Phone: [phone]  Valerie Vazquez is a 60 y.o. female who presents today for same-day visit.  Patient notes onset of a bump on her left chin several days ago. Notes this started out like her previous cysts have with a bump underneath the skin though has become red externally with intermittent golden crusting and intermittent smoothness. Minimal discomfort. Has not drained any pus. The lump is gone. No fevers. Does note one swollen lymph node underneath her chin. Currently on ciprofloxacin and Flagyl for possible colitis.   ROS see history of present illness  Objective  Physical Exam Vitals:   04/16/16 0835  BP: 122/66  Pulse: 95  Temp: 98.2 F (36.8 C)    BP Readings from Last 3 Encounters:  04/16/16 122/66  04/11/16 110/88  04/11/16 140/78   Wt Readings from Last 3 Encounters:  04/16/16 203 lb (92.1 kg)  04/11/16 203 lb (92.1 kg)  04/11/16 200 lb (90.7 kg)    Physical Exam  Constitutional: No distress.  HENT:  Head:    Single enlarged lymph node underneath her chin that is non-tender  Cardiovascular: Normal rate, regular rhythm and normal heart sounds.   Pulmonary/Chest: Effort normal and breath sounds normal.  Skin: She is not diaphoretic.     Assessment/Plan: Please see individual problem list.  Rash and nonspecific skin eruption Rash is somewhat nonspecific, though with golden crusting and underlying erythema could be impetigo particularly given location. Doubt cellulitis. No signs of abscess. No signs of cyst. We will treat with topical mupirocin to see if this will benefit the rash. If no improvement over the next several days would consider oral antibiotics when she is off of her current ciprofloxacin and Flagyl. Given return precautions.   No orders of the defined types were placed in this encounter.   Meds ordered this encounter  Medications  . mupirocin ointment (BACTROBAN) 2 %    Sig: Place 1 application into the nose  3 (three) times daily.    Dispense:  22 g    Refill:  0    [name], MD [place]

## 2016-04-17 ENCOUNTER — Telehealth: Payer: Self-pay | Admitting: *Deleted

## 2016-04-17 NOTE — Telephone Encounter (Signed)
Patient could try OTC hydrocortisone ointment if she does not want to continue the antibiotic ointment. If the area does not improve with the hydrocortisone we could consider antibiotics by mouth though she would need to be finished with the antibiotics for her colitis.

## 2016-04-17 NOTE — Telephone Encounter (Signed)
Contacted patient she stated that rash started to smooth over. But now after putting medication on it, seems like wound has become very open and painful. Wondering if she needs to change medication.  Patient was seen yesterday for rash on face and trunk.  Please advise.

## 2016-04-17 NOTE — Telephone Encounter (Signed)
Patient has been notified

## 2016-04-17 NOTE — Telephone Encounter (Signed)
Noted  

## 2016-04-17 NOTE — Telephone Encounter (Signed)
Pt requested a call to discuss her issue with her rash. She was given a ointment on yesterday ,she used it twice. The place on her chin has now opened from a smooth surface. She also has a rash under ans between her breast that has itching.  Pt contact [phone]

## 2016-04-23 ENCOUNTER — Encounter: Payer: Self-pay | Admitting: Family Medicine

## 2016-04-23 ENCOUNTER — Telehealth: Payer: Self-pay | Admitting: Family Medicine

## 2016-04-28 IMAGING — CR DG CHEST 2V
2 series · 2 of 2 positions shown · non-contrast
Comparison: 04/07/2014

CLINICAL DATA: Chest pain and shortness of breath.

EXAM:
CHEST  2 VIEW

[x chest ap]
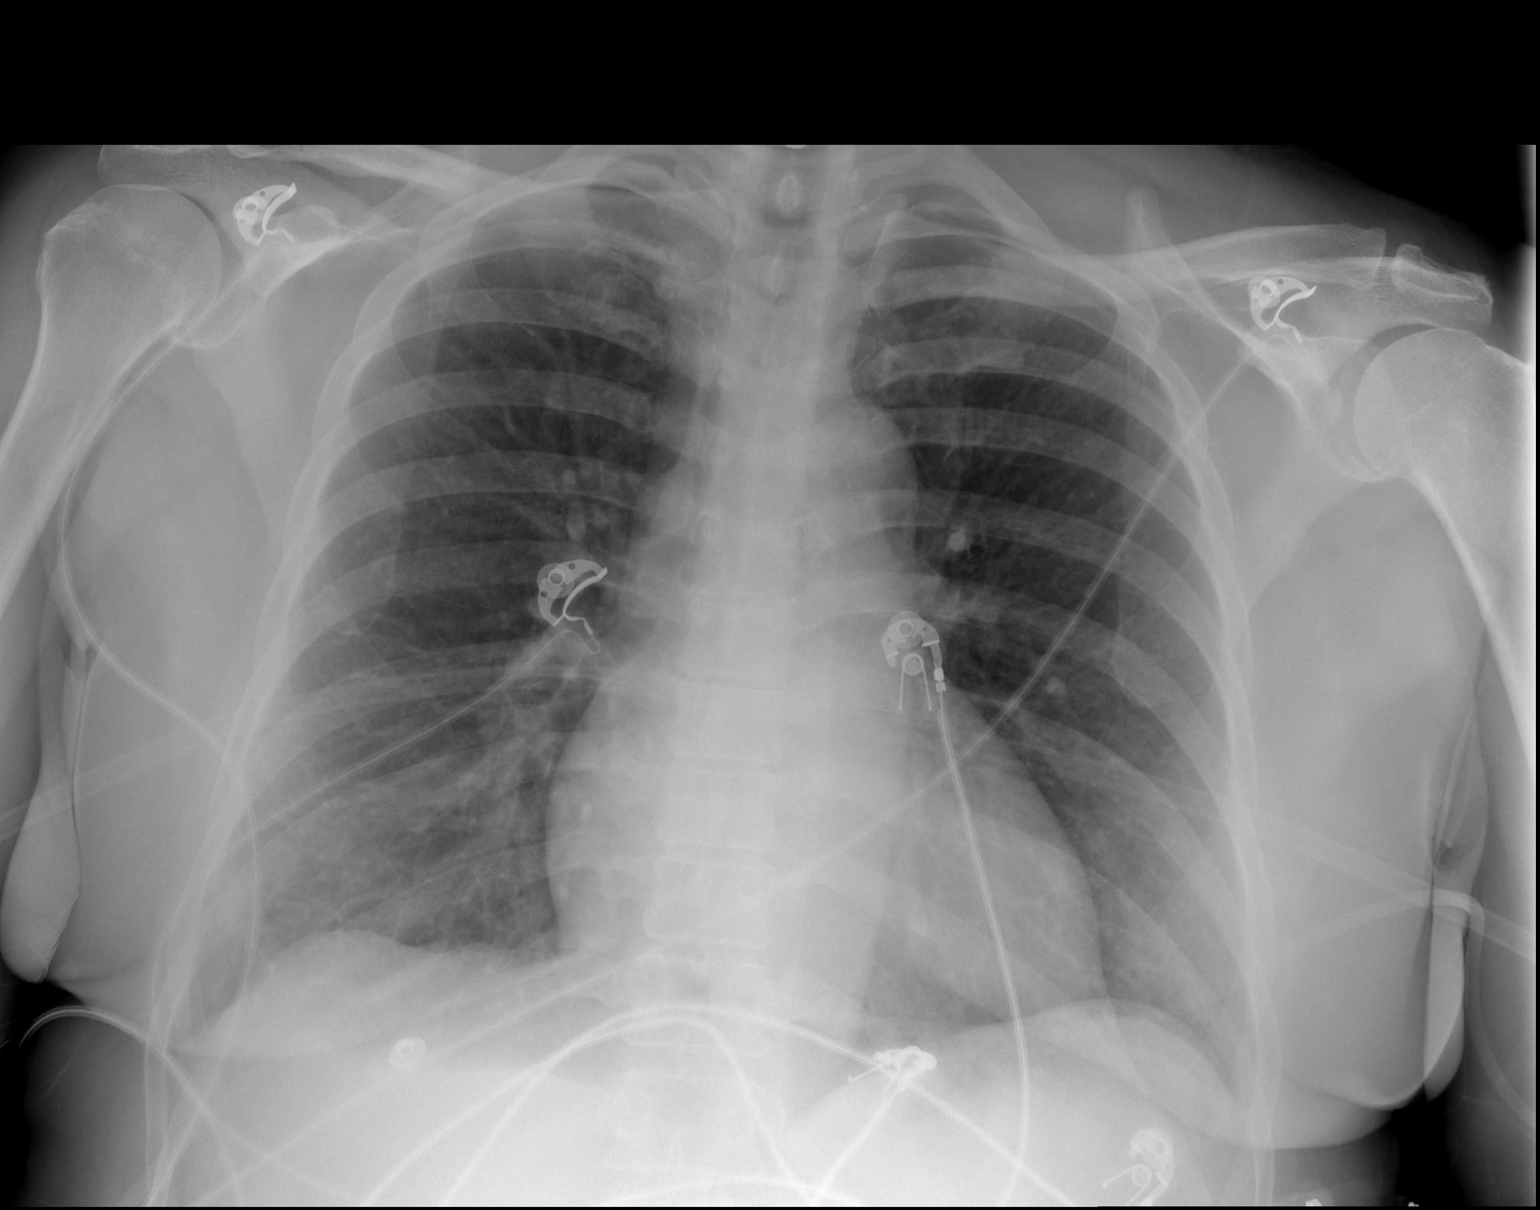

[w chest lat]
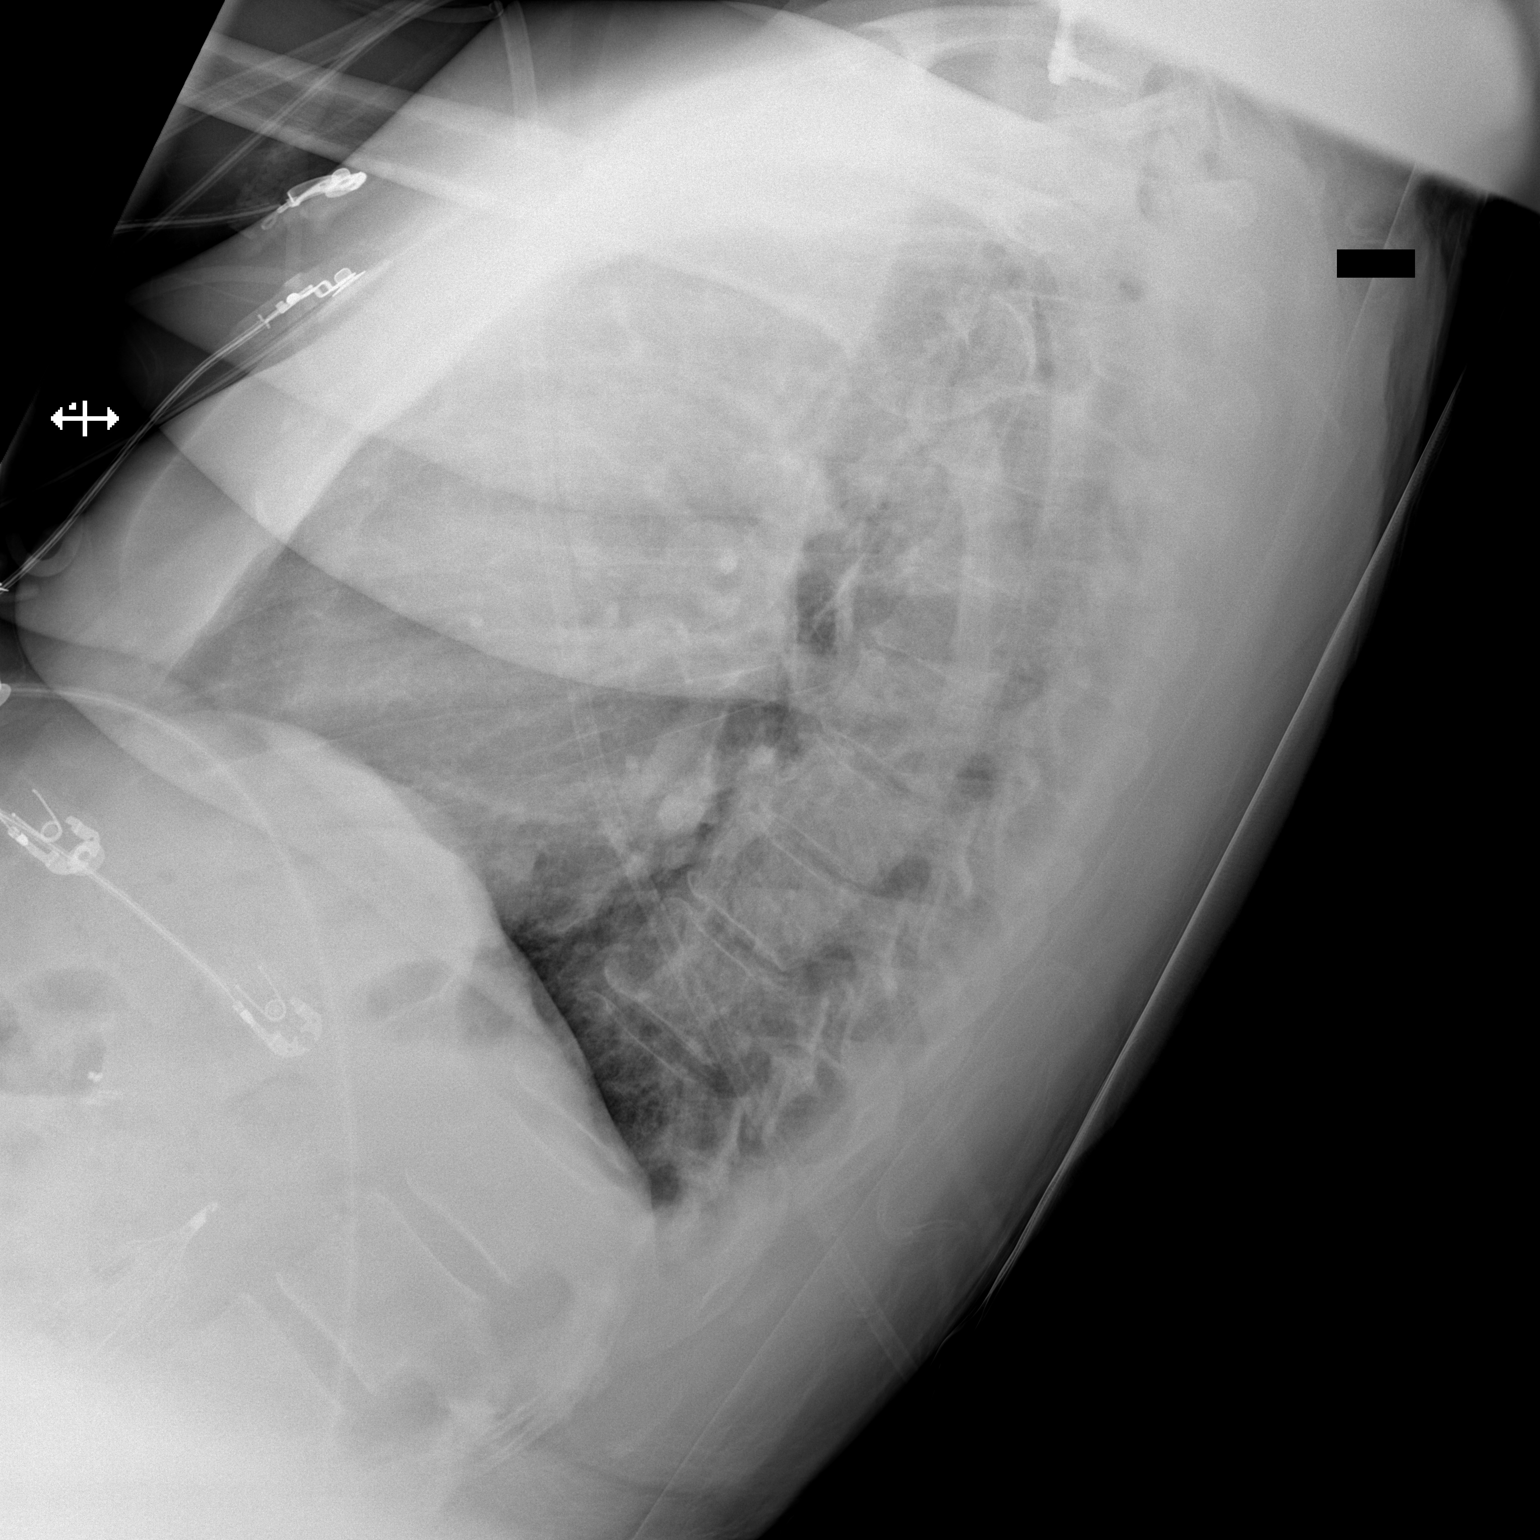

[2 of 2 positions shown; findings below may reference images not displayed]

FINDINGS: The heart size and mediastinal contours are within normal limits.
Both lungs are clear. The visualized skeletal structures are
unremarkable. Postoperative change in the cervical spine. Inferior
vena caval filter.
IMPRESSION: No active cardiopulmonary disease.

## 2016-04-29 IMAGING — CT CT ANGIO CHEST
4 of 14 series · 17 of 46 positions shown · IV contrast (Omni 300)
Comparison: Chest radiograph June 21, 2014 and CT of the abdomen
and pelvis May 28, 2014 and CT of the chest January 01, 2014

CLINICAL DATA: Chest pain, shortness of breath, assess for
pulmonary embolism. Severe LEFT-sided abdominal pain, nausea and
emesis; rule out diverticulitis.

EXAM:
CT ANGIOGRAPHY CHEST
CT ABDOMEN AND PELVIS WITH CONTRAST
TECHNIQUE: Multidetector CT imaging of the chest was performed using the
standard protocol during bolus administration of intravenous
contrast. Multiplanar CT image reconstructions and MIPs were
obtained to evaluate the vascular anatomy. Multidetector CT imaging
of the abdomen and pelvis was performed using the standard protocol
during bolus administration of intravenous contrast.
CONTRAST:  100mL OMNIPAQUE IOHEXOL 350 MG/ML SOLN

[Series 5: thins · axial · 0.64mm/px · z∈[+1265,+1475]mm · 11 of 254 slices shown]
[im 22/254  lung]
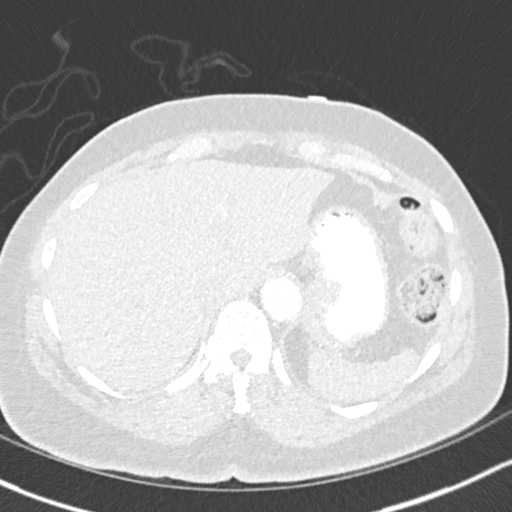
[im 43/254  soft-tissue]
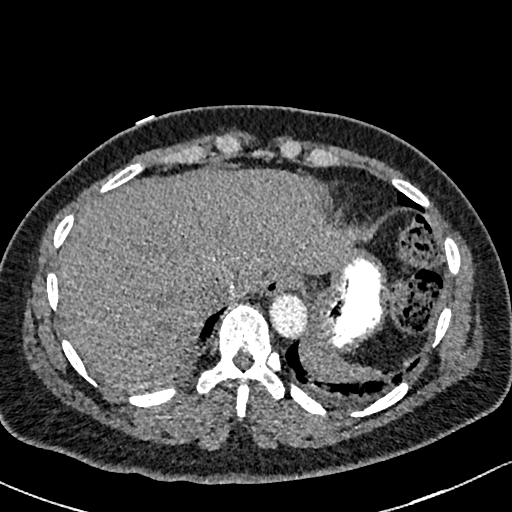
[im 64/254  lung]
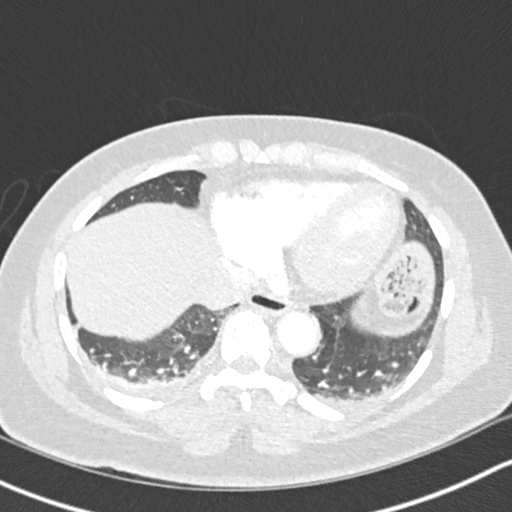
[im 85/254  soft-tissue]
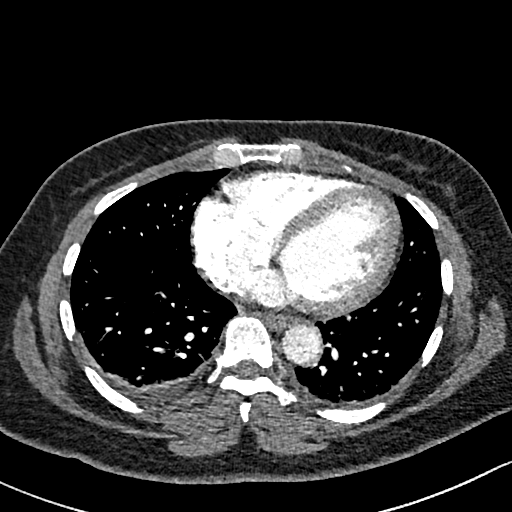
[im 106/254  lung]
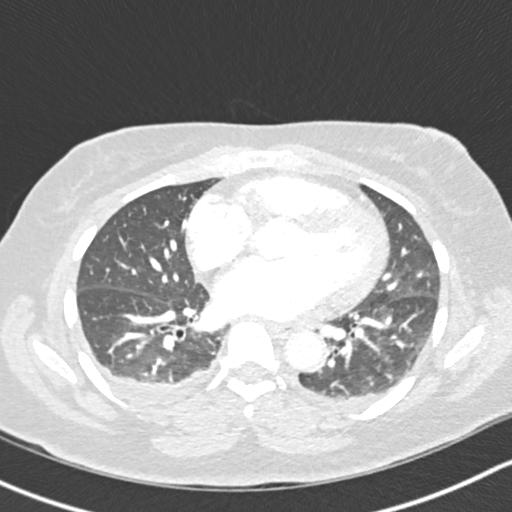
[im 127/254  soft-tissue]
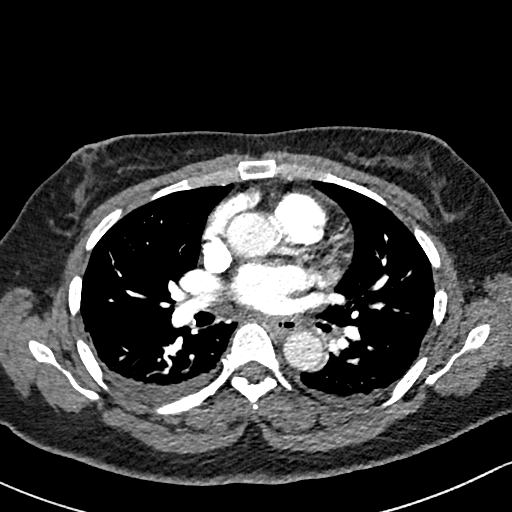
[im 148/254  lung]
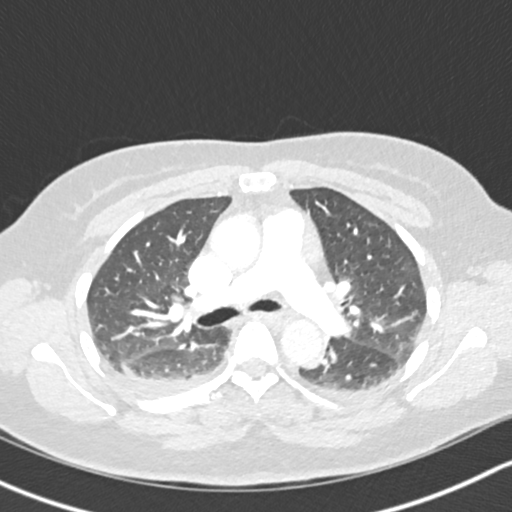
[im 169/254  soft-tissue]
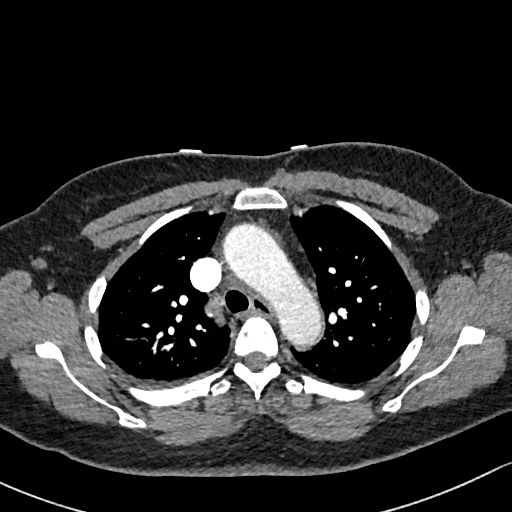
[im 190/254  lung]
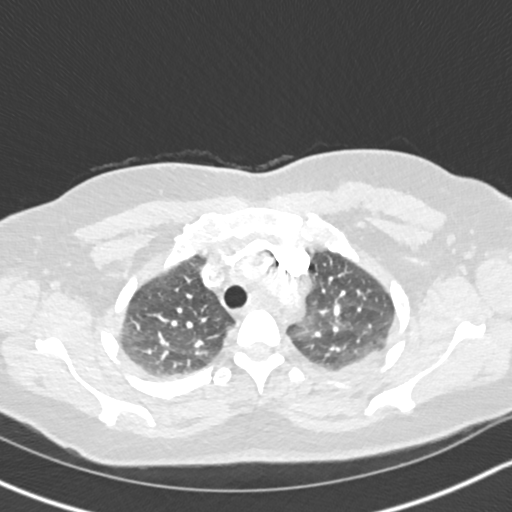
[im 211/254  soft-tissue]
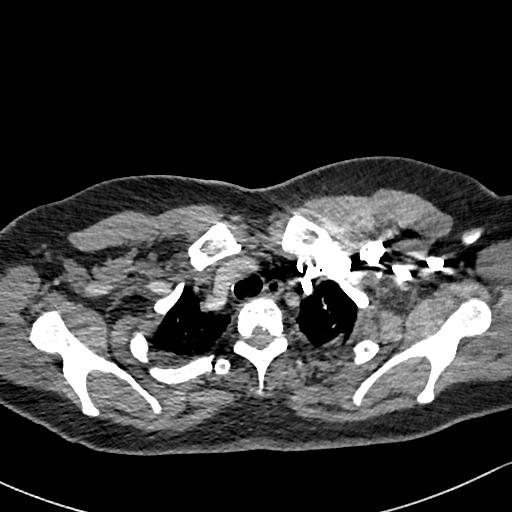
[im 232/254  lung]
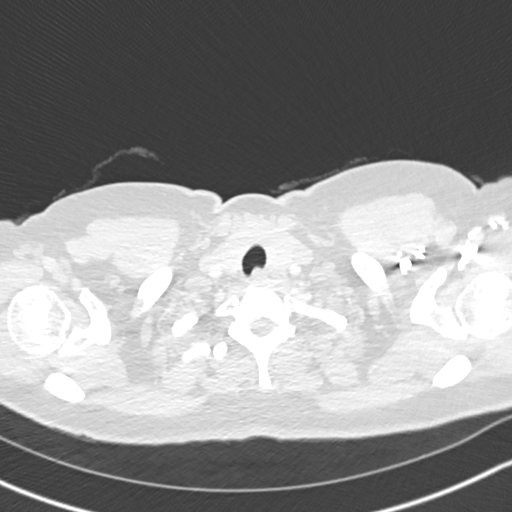

[Series 6: lung · axial · 0.77mm/px · z∈[+1339,+1405]mm · 2 of 68 slices shown]
[im 23/68  soft-tissue]
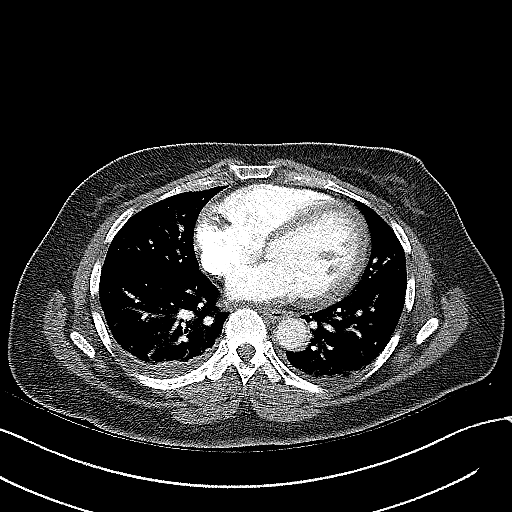
[im 45/68  soft-tissue]
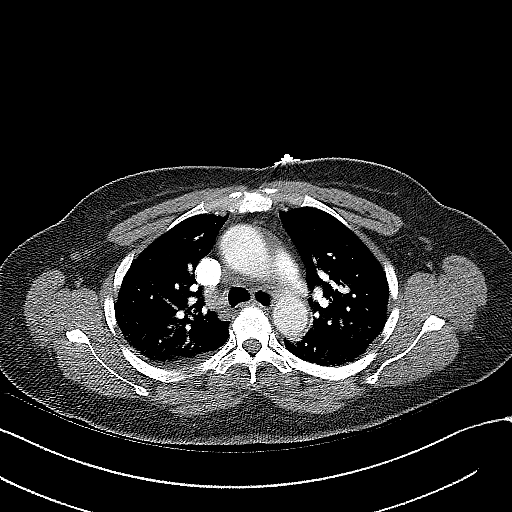

[Series 7: coronal mpr · coronal · 0.59mm/px · 1 of 111 slices shown]
[im 56/111  soft-tissue]
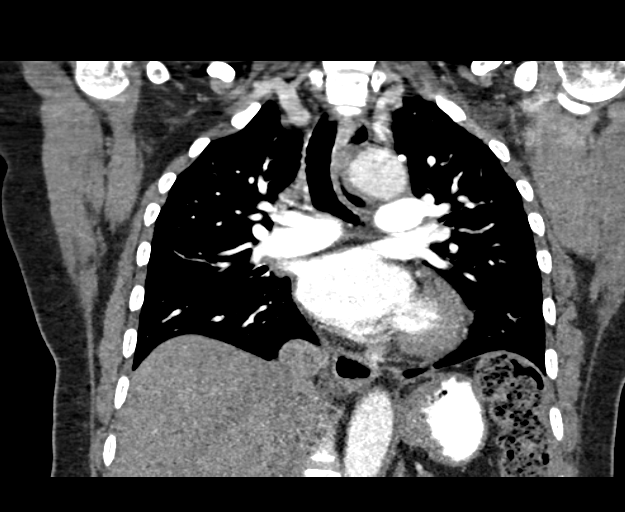

[Series 11: abd/ pelvis 5.0 i30f 1 · axial · 0.73mm/px · z∈[+949,+1149]mm · 3 of 102 slices shown]
[im 21/102  lung]
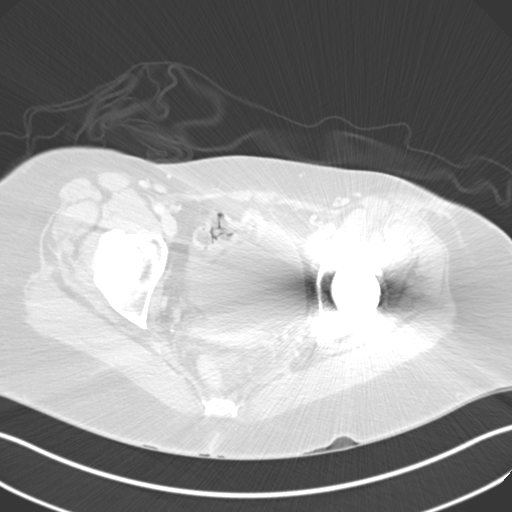
[im 41/102  lung]
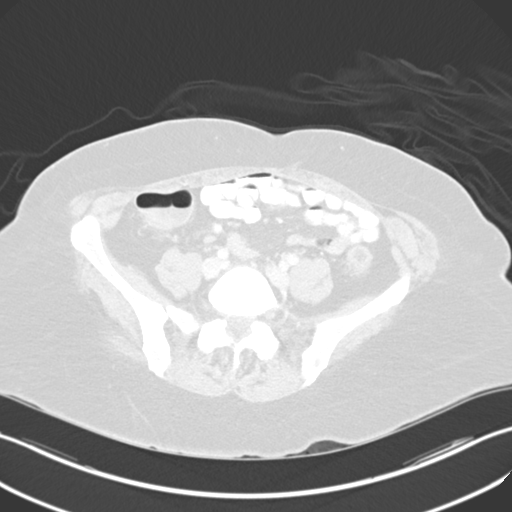
[im 61/102  lung]
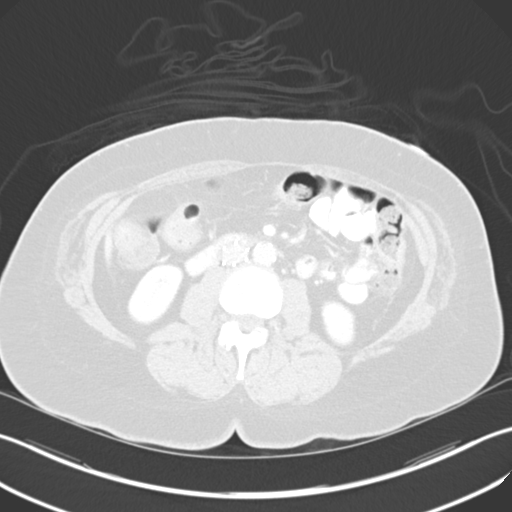

[17 of 46 positions shown; findings below may reference images not displayed]

FINDINGS: CTA CHEST FINDINGS

Adequate contrast opacification of the pulmonary artery's. Main
pulmonary artery is not enlarged. No pulmonary arterial filling
defects to the level of the subsegmental branches.

The heart is mildly enlarged, no right heart strain. No pericardial
fluid collections. Thoracic aorta is normal course and caliber, 2
vessel aortic arch is a normal variant. No lymphadenopathy by CT
size criteria. Tracheobronchial tree is patent, no pneumothorax.
Mild heterogeneous lung attenuation without focal consolidation.
Small RIGHT and trace LEFT pleural effusions effusions.

Included view of the abdomen is unremarkable. Visualized soft
tissues and included osseous structures appear normal.

CT ABDOMEN and PELVIS FINDINGS

SOLID ORGANS: The liver, spleen, pancreas and adrenal glands are
unremarkable. Status post cholecystectomy.

GASTROINTESTINAL TRACT: The stomach, small and large bowel are
normal in course and caliber without inflammatory changes. Enteric
contrast has not yet reached the distal small bowel. Mild amount of
retained large bowel stool. The appendix is not discretely
identified, however there are no inflammatory changes in the right
lower quadrant.

KIDNEYS/ URINARY TRACT: Kidneys are orthotopic, demonstrating
symmetric enhancement. No nephrolithiasis, hydronephrosis or solid
renal masses. Too small to characterize hypodensities in the kidneys
bilaterally The unopacified ureters are normal in course and
caliber. Delayed imaging through the kidneys demonstrates symmetric
prompt contrast excretion within the proximal urinary collecting
system. Urinary bladder is partially distended and unremarkable.

PERITONEUM/RETROPERITONEUM: No intraperitoneal free fluid nor free
air. Aortoiliac vessels are normal in course and caliber, with
minimal calcific atherosclerosis. Inferior vena cava filter below
the level of the renal veins. No lymphadenopathy by CT size
criteria. Status post hysterectomy. 2 cm benign-appearing LEFT
adnexal cyst level below size surveillance recommendations.

SOFT TISSUE/OSSEOUS STRUCTURES: Nonsuspicious. Status post LEFT hip
total arthroplasty which results in streak artifact limiting
assessment of the pelvis. Severe lower lumbar facet arthropathy.
Moderate sacroiliac osteoarthrosis. Mildly expansile fusiform mass
in central sacral spinal canal favors a meningeal cyst without
destructive bony changes.

Review of the MIP images confirms the above findings.
IMPRESSION: CTA CHEST: No acute pulmonary embolism.

Mild cardiomegaly, trace to small pleural effusions. Mild
heterogeneous lung attenuation may reflect pulmonary edema or small
airway disease.

CT ABDOMEN AND PELVIS:  No acute intra-abdominal or pelvic process.

Limited assessment LEFT lower quadrant due to streak artifact from
LEFT hip arthroplasty. Probable sacral meningeal cyst ; is there are
referable symptoms, MRI of the sacrum the contrast could be
considered on a nonemergent basis.

  By: Maria Trifina Caveza

## 2016-05-07 ENCOUNTER — Telehealth: Payer: Self-pay | Admitting: Surgical

## 2016-05-07 NOTE — Telephone Encounter (Signed)
Spoke with Jacqlyn Larsen at Dr [facility]ley [facility] office. She stated that she needs in writing that patient is cleared to do a Myogram due to patient being on Eliquis. She is going to also contact Dr Grayland Ormond office about clearance. Jacqlyn Larsen also stated that patient told them that Dr. Caryl Bis does not want the patient taking narcotics due to her Colitis. I told her that I was not sure about this, but I do know that Dr. 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 Jacqlyn Larsen number is [phone]

## 2016-05-07 NOTE — Telephone Encounter (Signed)
Noted I have explained to [facility] that clearance would need to come from Hematology.

## 2016-05-07 NOTE — Telephone Encounter (Signed)
Instructions regarding patient's Eliquis should come from the hematologist. If they need other clearance I am happy to provide any information. I do not remember telling the patient not to take narcotics due to her colitis though may have discussed with her that her constipation is related to the narcotics. She has previously been referred to pain management as I do not prescribe chronic narcotics.

## 2016-05-26 IMAGING — CR DG ABDOMEN ACUTE W/ 1V CHEST
3 series · 3 of 3 positions shown · non-contrast
Comparison: None.

CLINICAL DATA: Left abdominal pain, left flank pain

EXAM:
ACUTE ABDOMEN SERIES (ABDOMEN 2 VIEW & CHEST 1 VIEW)

[chest pa]
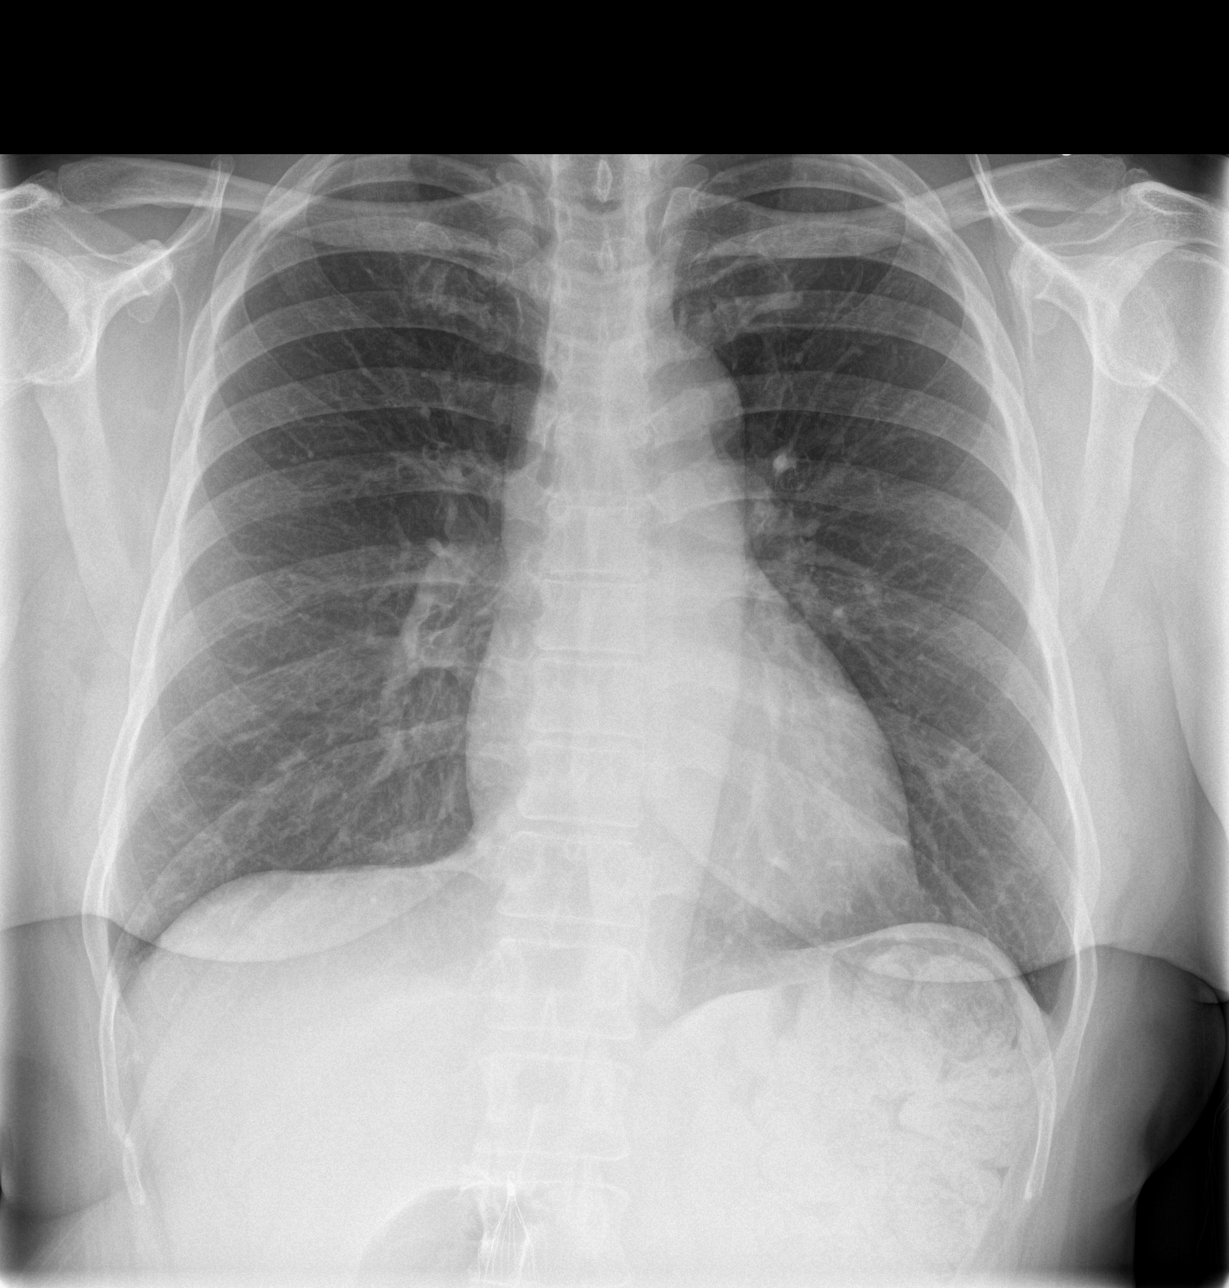

[abdomen erect]
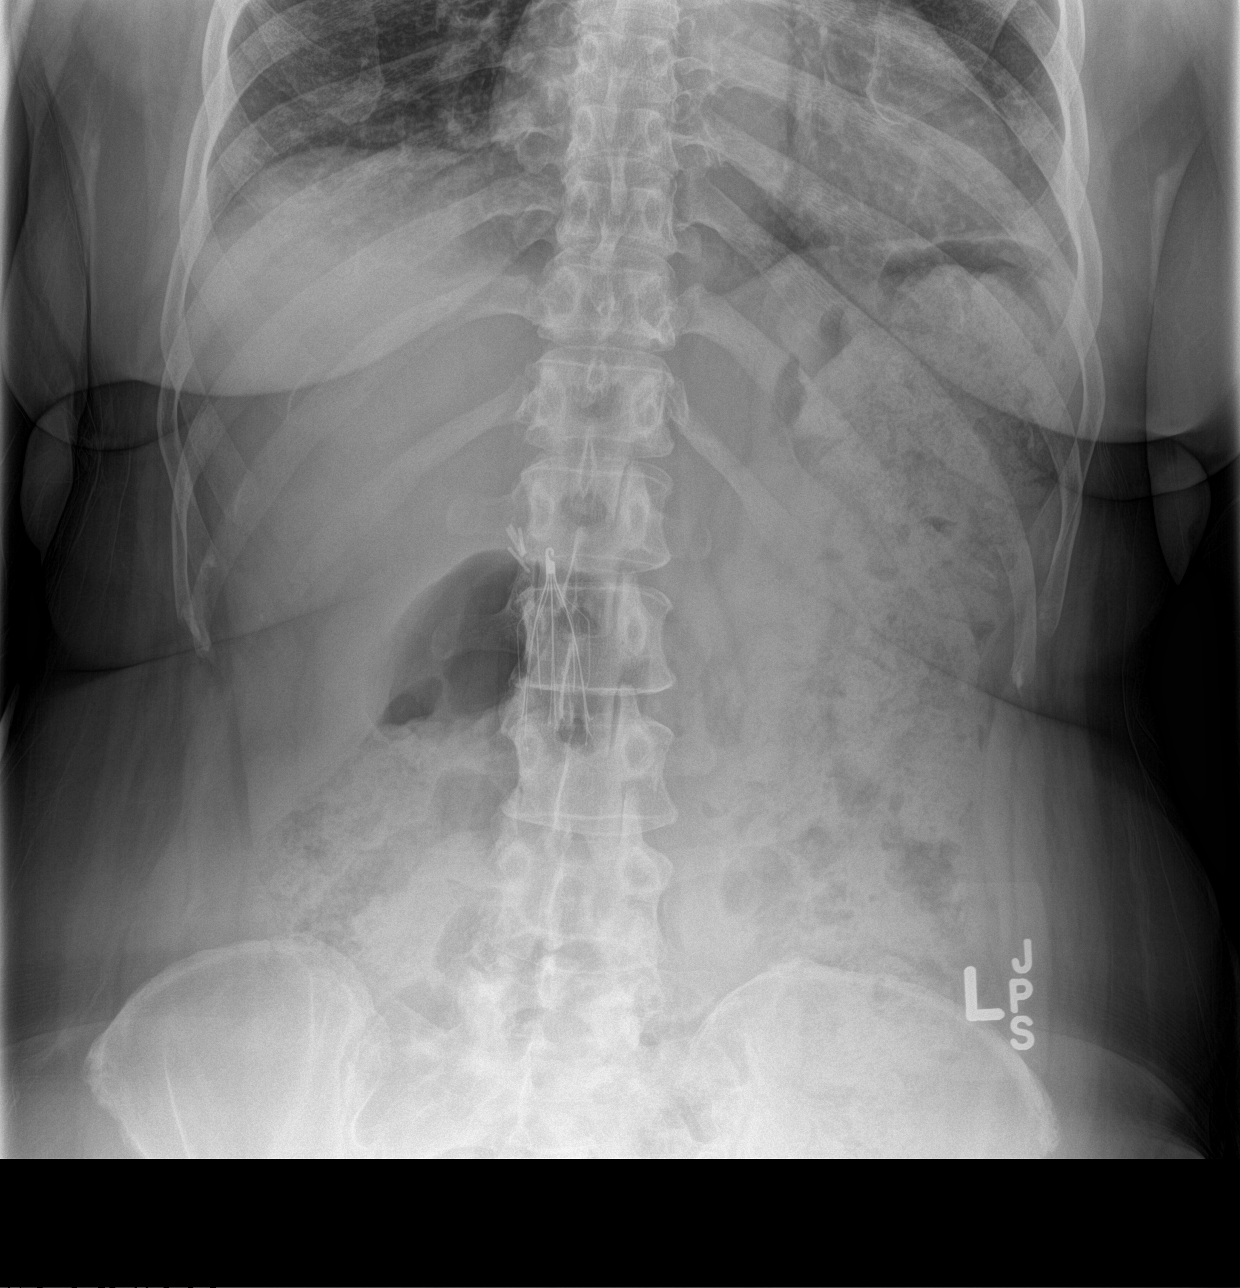

[abdomen supine]
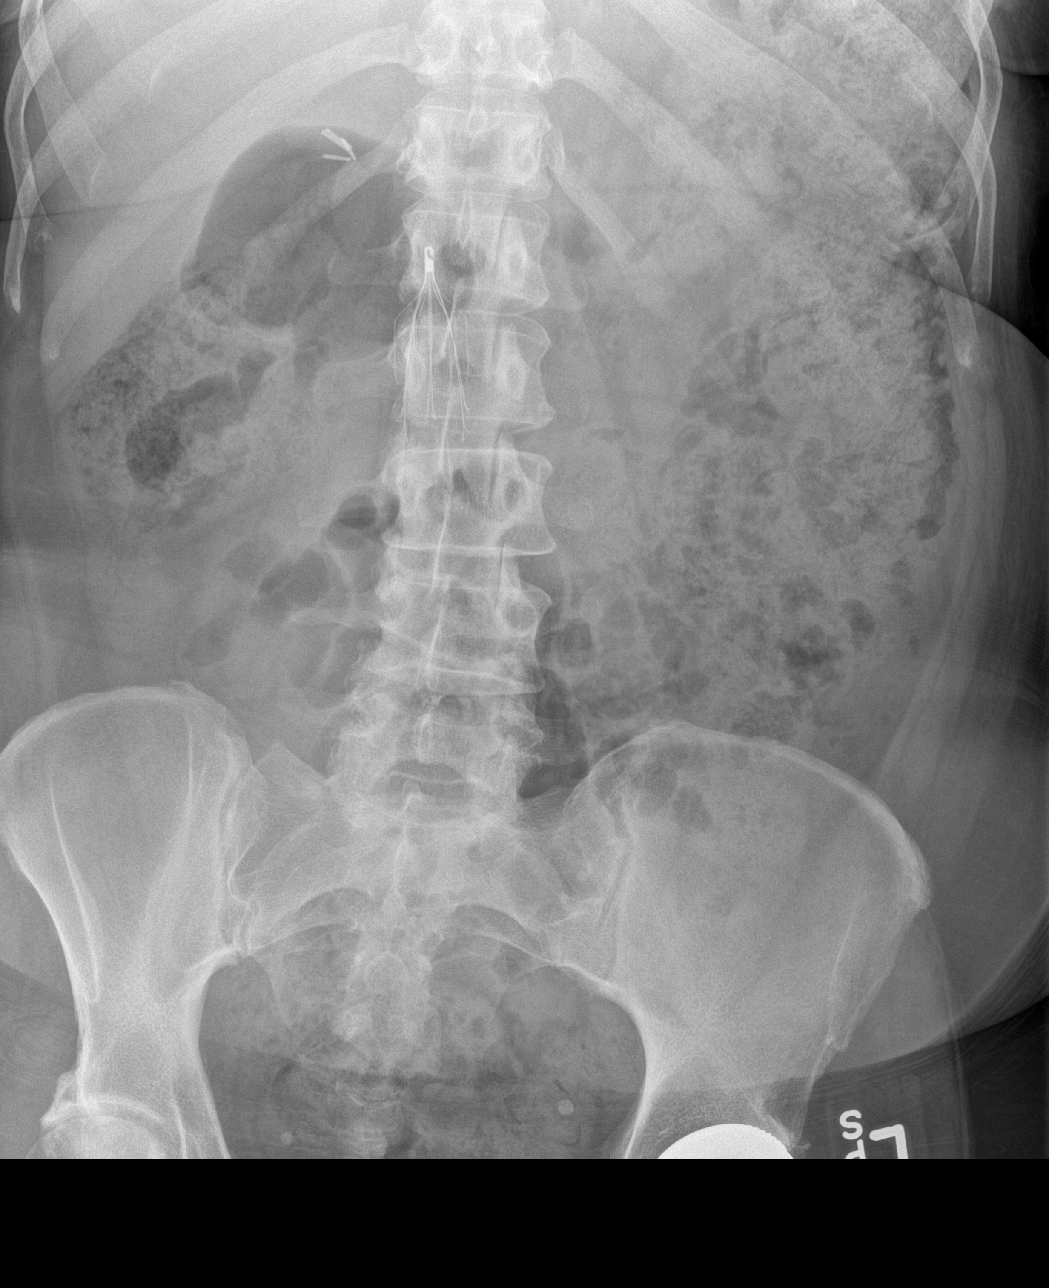

[3 of 3 positions shown; findings below may reference images not displayed]

FINDINGS: There is no evidence of dilated bowel loops or free intraperitoneal
air. Large amount of stool throughout the colon. No radiopaque
calculi or other significant radiographic abnormality is seen. IVC
filter is noted with the tip projecting over the L1 vertebral body.
Heart size and mediastinal contours are within normal limits. Both
lungs are clear.

Left hip arthroplasty.  Mild osteoarthritis of the right hip.
IMPRESSION: Large amount of stool throughout the colon.

No acute cardiopulmonary disease.

## 2016-05-31 ENCOUNTER — Encounter: Payer: Self-pay | Admitting: Family Medicine

## 2016-06-02 ENCOUNTER — Other Ambulatory Visit: Payer: Self-pay | Admitting: Family Medicine

## 2016-06-02 ENCOUNTER — Other Ambulatory Visit: Payer: Self-pay | Admitting: Surgical

## 2016-06-02 MED ORDER — CYCLOBENZAPRINE HCL 10 MG PO TABS: 10.0000 mg | ORAL_TABLET | Freq: Three times a day (TID) | ORAL | 5 refills | Status: DC | PRN

## 2016-06-02 MED ORDER — ESCITALOPRAM OXALATE 10 MG PO TABS
10.0000 mg | ORAL_TABLET | Freq: Every day | ORAL | 1 refills | Status: DC
Start: 2016-06-02 — End: 2016-08-15

## 2016-06-08 IMAGING — DX DG CHEST 2V
2 series · 2 of 2 positions shown · non-contrast
Comparison: Single view of the chest 07/19/2014. CT chest
06/22/2014.

CLINICAL DATA: Swelling about the left axilla and chest pain for
1-1/2 weeks.

EXAM:
CHEST  2 VIEW

[chest pa]
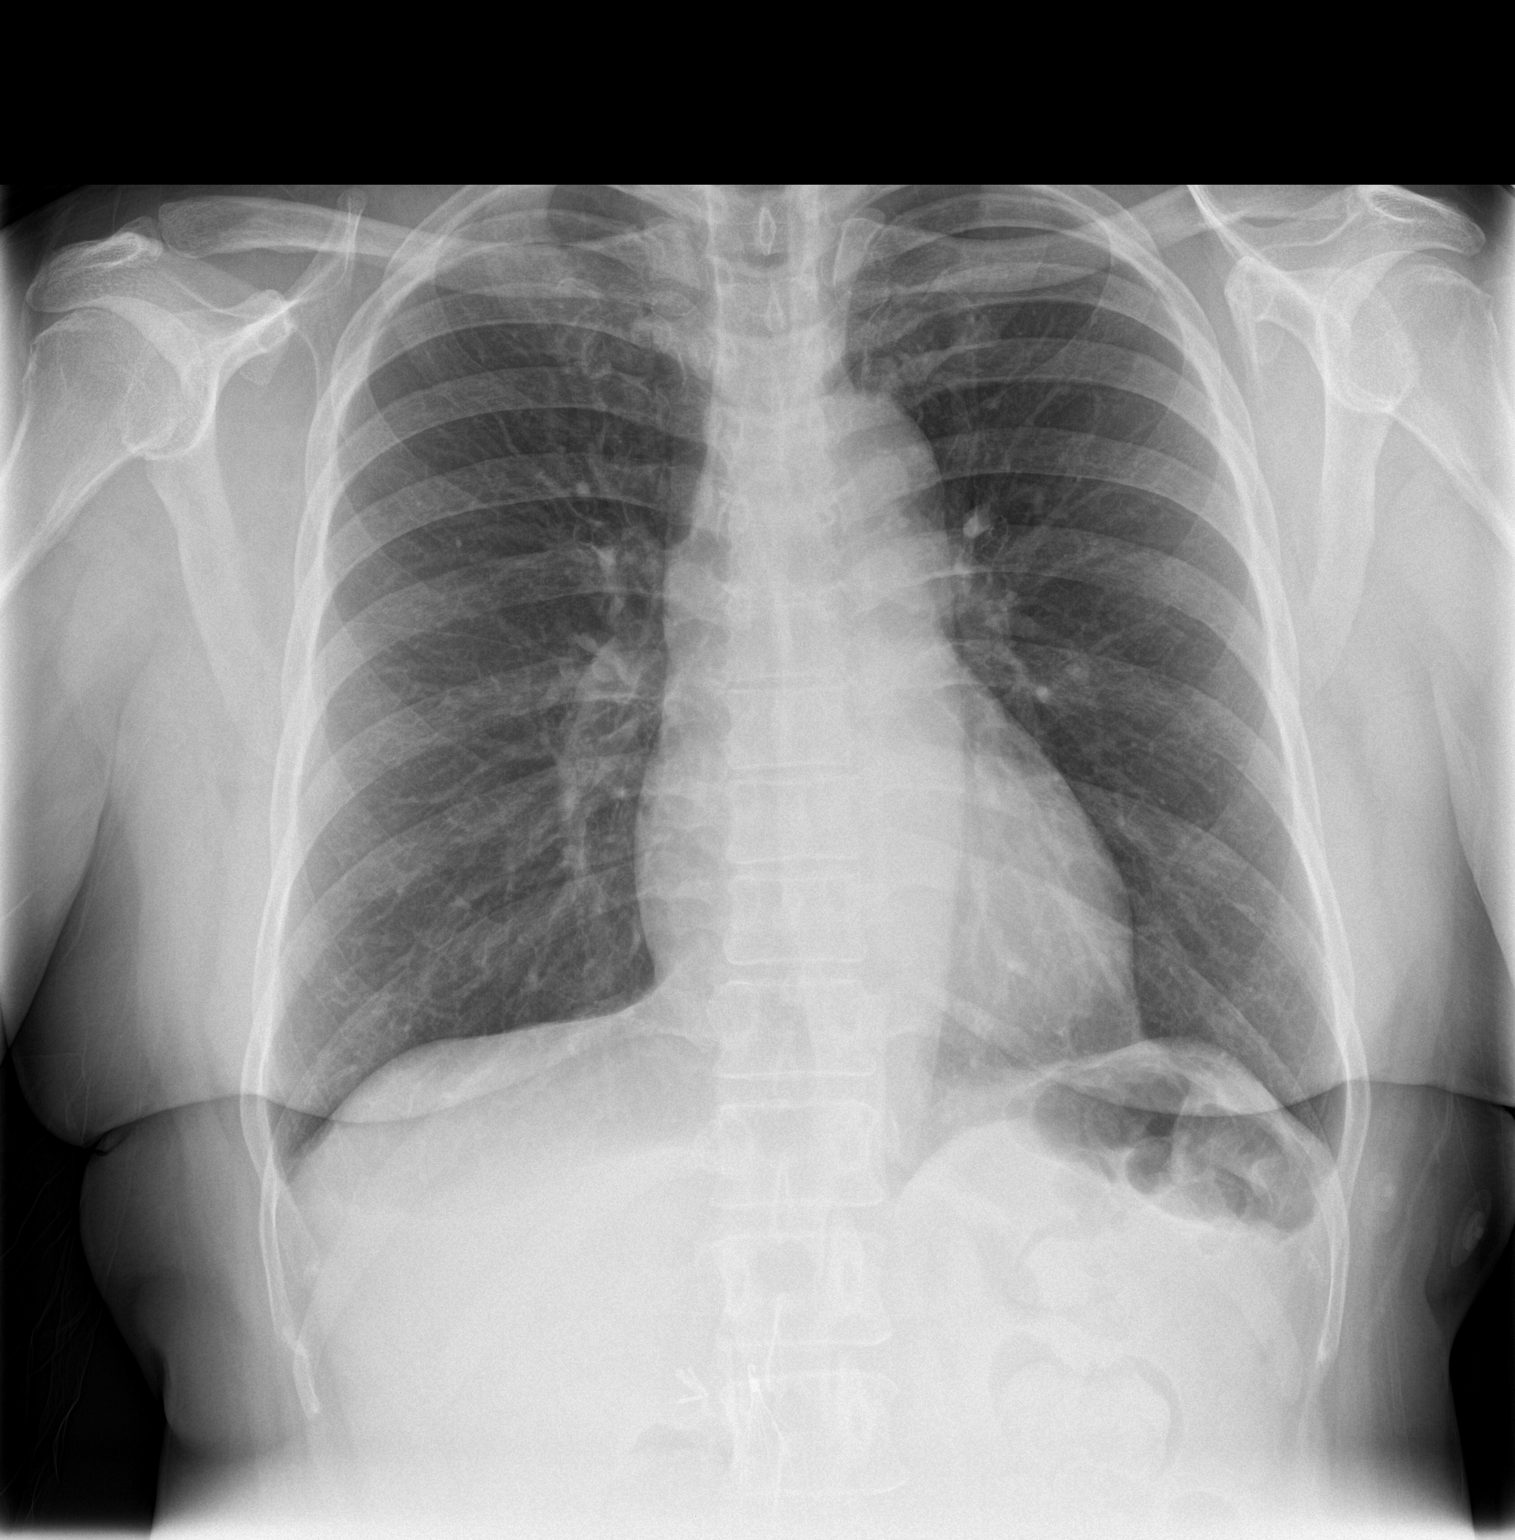

[chest lat]
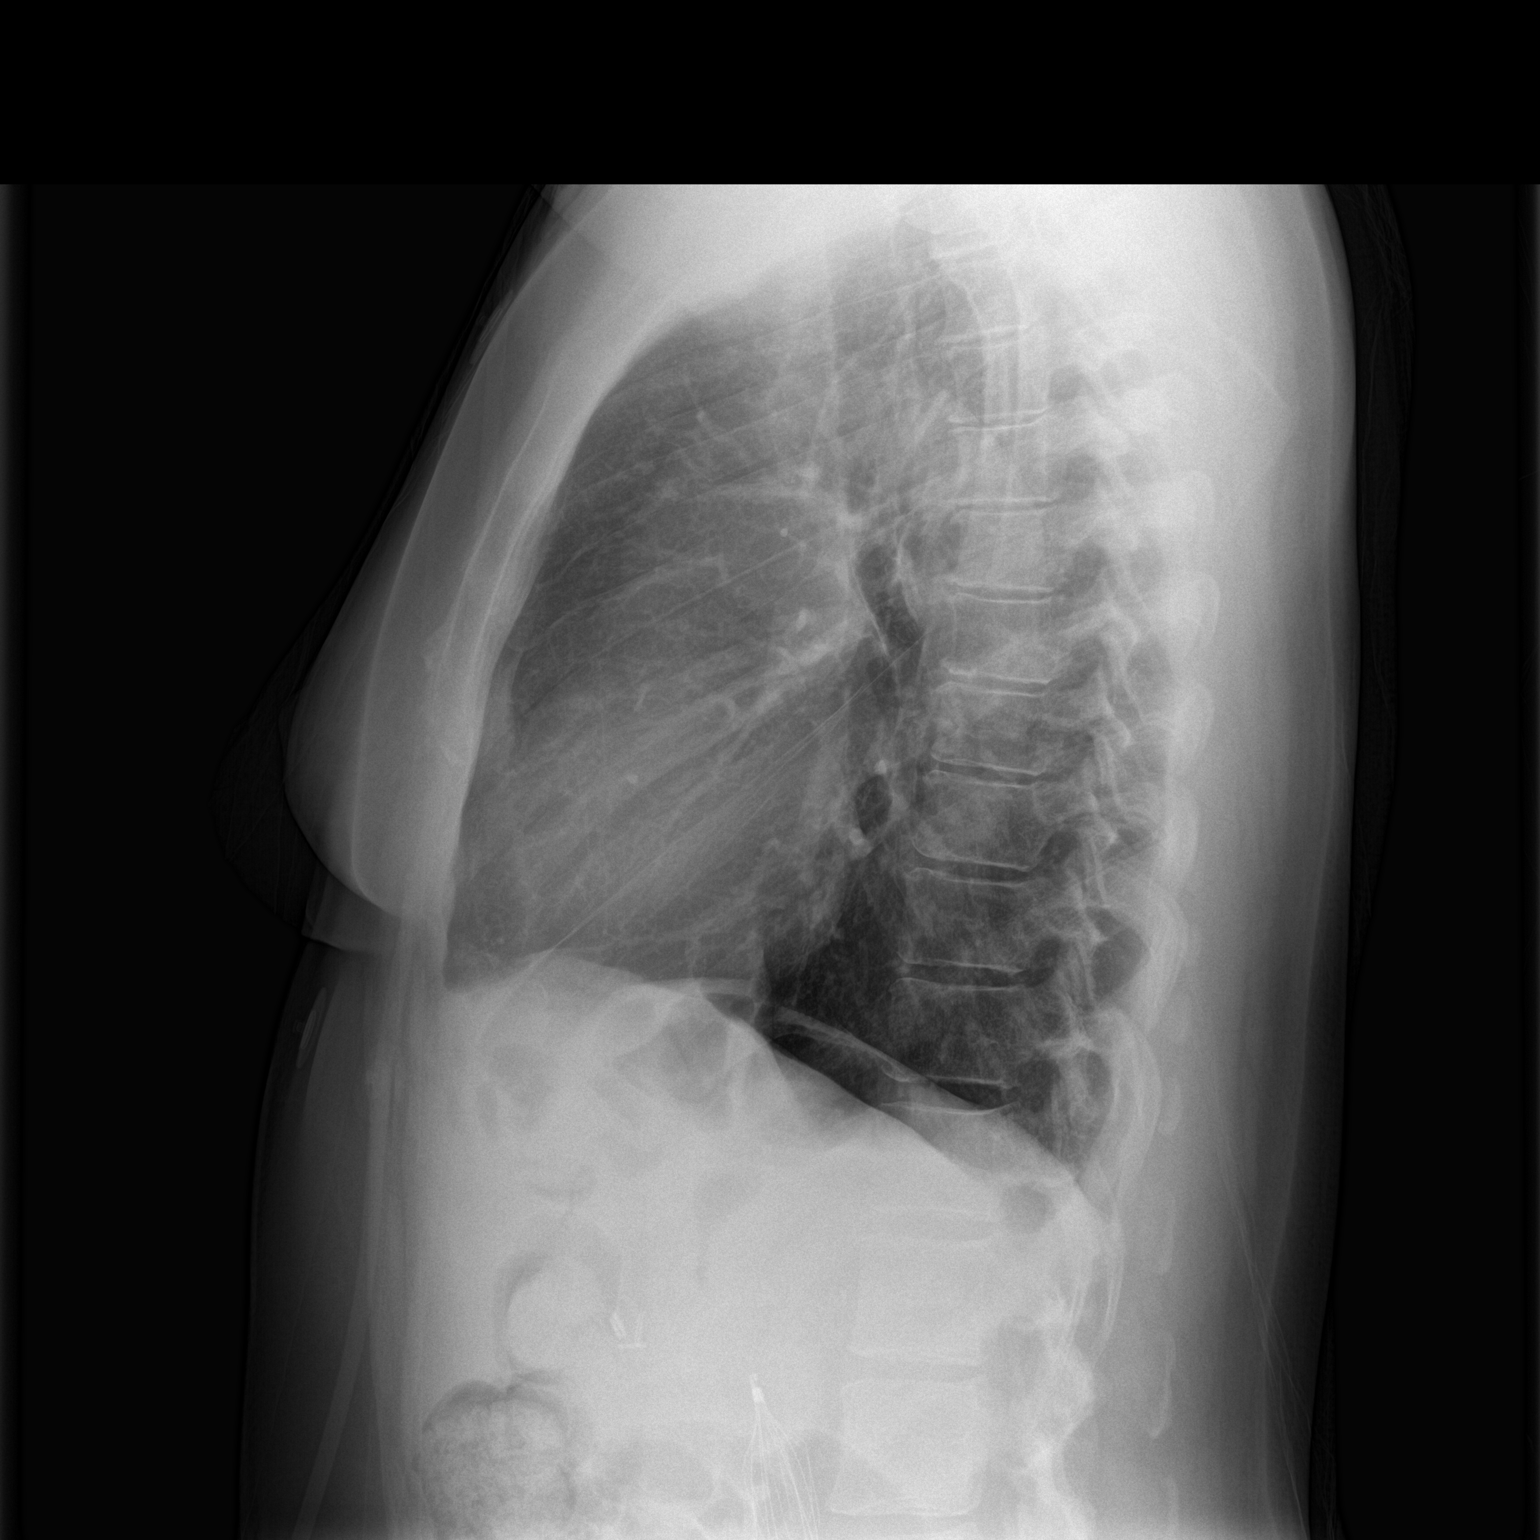

[2 of 2 positions shown; findings below may reference images not displayed]

FINDINGS: The lungs are clear. Heart size is normal. No pneumothorax or
pleural effusion is identified. IVC filter and cholecystectomy clips
are noted.
IMPRESSION: No acute disease.

## 2016-06-17 ENCOUNTER — Telehealth: Payer: Self-pay | Admitting: Family Medicine

## 2016-06-17 NOTE — Telephone Encounter (Signed)
Pt called about needing her records regarding her health and psychiatrist. Facility is [facility] called Fax to [phone]. Phone number [phone]. Thank you!

## 2016-06-18 NOTE — Telephone Encounter (Signed)
Called patient and let her know that we will need a medical release signed. She will go to the doctors office in [place] to sign release and have them send for records.

## 2016-06-19 DIAGNOSIS — F251 Schizoaffective disorder, depressive type: Secondary | ICD-10-CM | POA: Diagnosis not present

## 2016-06-25 IMAGING — MR MRI OF THE LEFT SHOULDER WITHOUT CONTRAST
5 series · 40 of 40 positions shown · non-contrast
Comparison: None.

CLINICAL DATA: Left shoulder pain with decreased and painful range
of motion for 1 month.

EXAM:
MRI OF THE LEFT SHOULDER WITHOUT CONTRAST
TECHNIQUE: Multiplanar, multisequence MR imaging of the shoulder was performed.
No intravenous contrast was administered.

[Series 3: T2 fat-sat · axial · 4.0mm · 0.55mm/px · z∈[-17,+66]mm · 8 of 20 slices shown (1 of 3)]
[im 1/20]
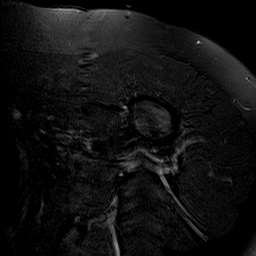
[im 3/20]
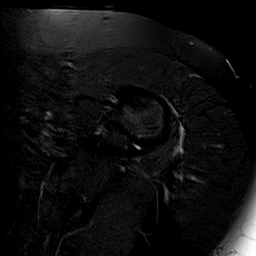
[im 6/20]
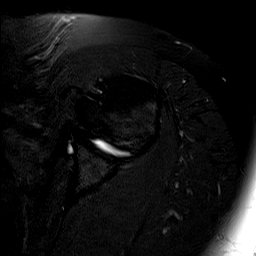
[im 9/20]
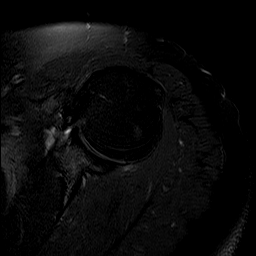
[im 11/20]
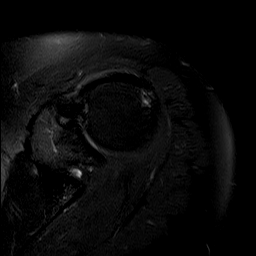
[im 14/20]
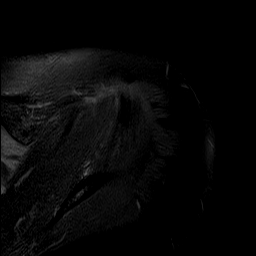
[im 17/20]
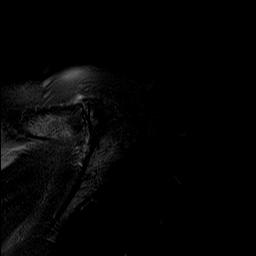
[im 20/20]
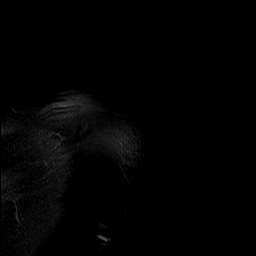

[Series 4: T2 fat-sat · oblique · 4.0mm · 0.55mm/px · 7 of 17 slices shown (2 of 3)]
[im 1/17]
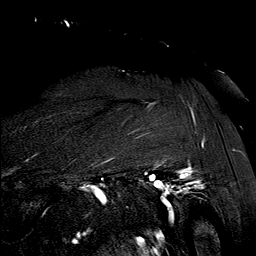
[im 3/17]
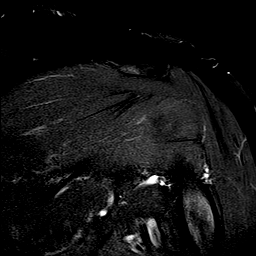
[im 6/17]
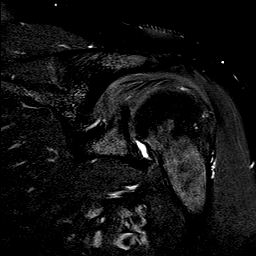
[im 9/17]
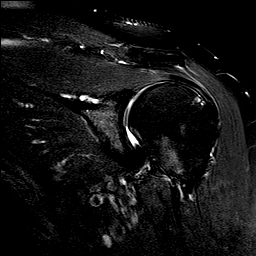
[im 11/17]
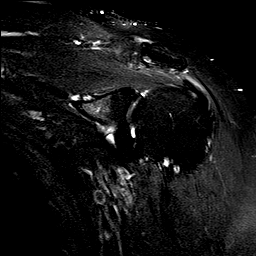
[im 14/17]
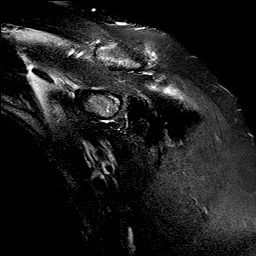
[im 17/17]
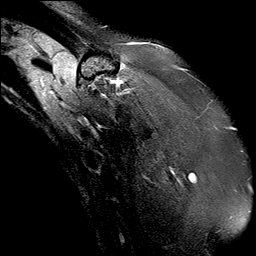

[Series 5: PD · oblique · 4.0mm · 0.55mm/px · 7 of 17 slices shown]
[im 1/17]
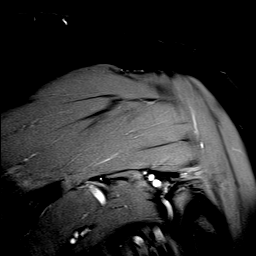
[im 3/17]
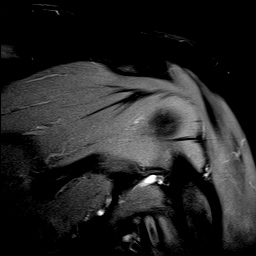
[im 6/17]
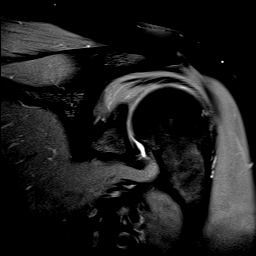
[im 9/17]
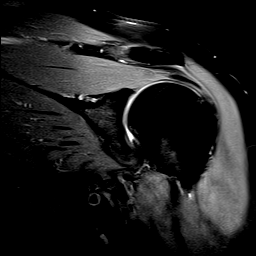
[im 11/17]
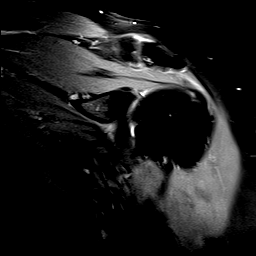
[im 14/17]
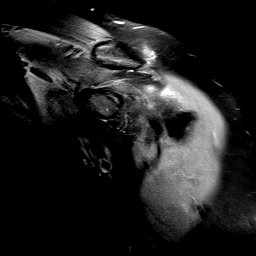
[im 17/17]
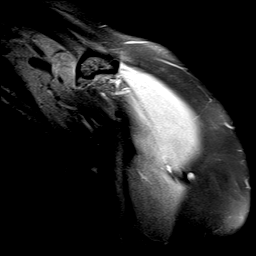

[Series 6: T1 · oblique · 4.0mm · 0.55mm/px · 9 of 20 slices shown]
[im 1/20]
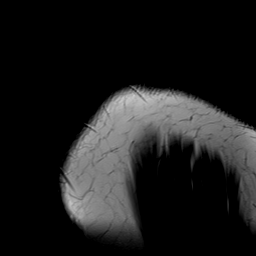
[im 3/20]
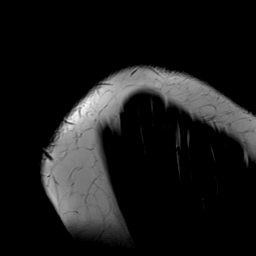
[im 5/20]
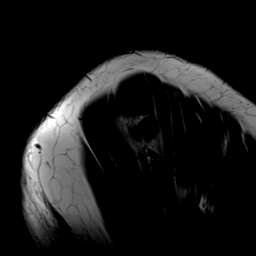
[im 8/20]
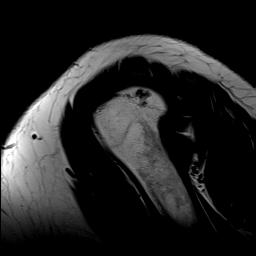
[im 10/20]
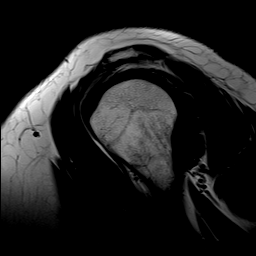
[im 12/20]
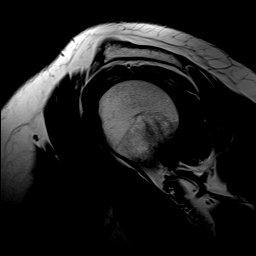
[im 15/20]
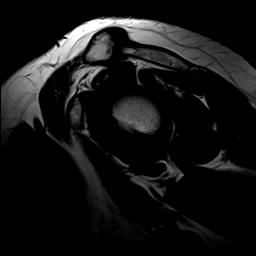
[im 17/20]
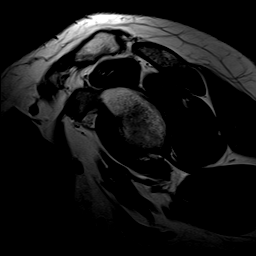
[im 20/20]
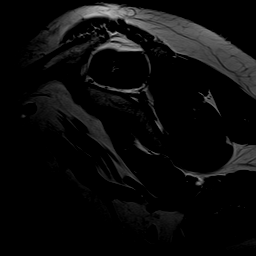

[Series 7: T2 fat-sat · oblique · 4.0mm · 0.55mm/px · 9 of 20 slices shown (3 of 3)]
[im 1/20]
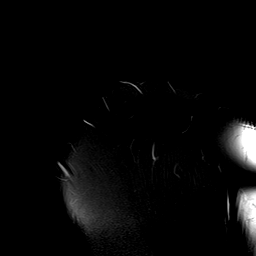
[im 3/20]
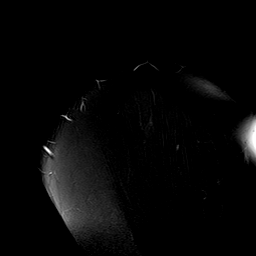
[im 5/20]
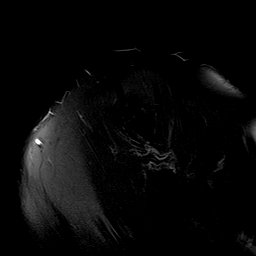
[im 8/20]
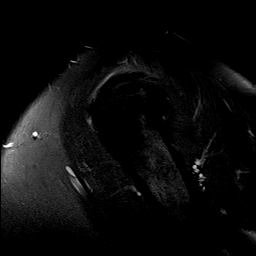
[im 10/20]
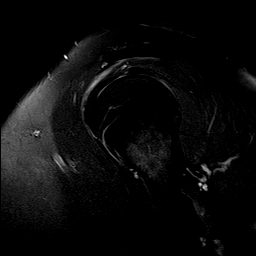
[im 12/20]
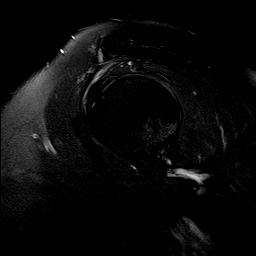
[im 15/20]
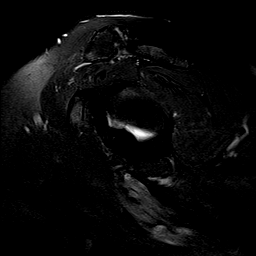
[im 17/20]
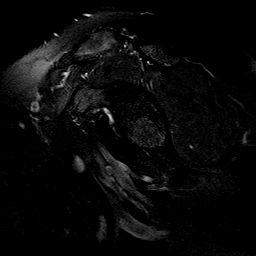
[im 20/20]
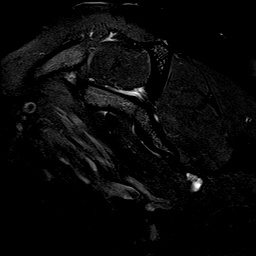

[40 of 40 positions shown; findings below may reference images not displayed]

FINDINGS: Rotator cuff: Minimal degenerative tendinosis of the distal
infraspinatus tendon. The rotator cuff is intact.

Muscles:  No atrophy or edema.

Biceps long head:  Properly located and intact.

Acromioclavicular Joint: Moderate hypertrophy of the
acromioclavicular joint. Normal type 2 acromion. Slight inflammation
of the subdeltoid bursa just anterior to the acromion overlying the
distal supraspinatus tendon.

Glenohumeral Joint: Normal.

Labrum:  Normal.

Bones: Small cystic degenerative changes of the posterior aspect of
the greater tuberosity of the proximal humerus.
IMPRESSION: 1. Focal mild subdeltoid bursitis anterior to the acromion overlying
the distal supraspinatus tendon.
2. Slight hypertrophy of the acromioclavicular joint which might
predispose to impingement.

## 2016-06-27 DIAGNOSIS — S199XXA Unspecified injury of neck, initial encounter: Secondary | ICD-10-CM | POA: Diagnosis not present

## 2016-06-27 DIAGNOSIS — S0990XA Unspecified injury of head, initial encounter: Secondary | ICD-10-CM | POA: Diagnosis not present

## 2016-06-27 DIAGNOSIS — S3992XA Unspecified injury of lower back, initial encounter: Secondary | ICD-10-CM | POA: Diagnosis not present

## 2016-06-27 DIAGNOSIS — S20229A Contusion of unspecified back wall of thorax, initial encounter: Secondary | ICD-10-CM | POA: Diagnosis not present

## 2016-06-27 DIAGNOSIS — S299XXA Unspecified injury of thorax, initial encounter: Secondary | ICD-10-CM | POA: Diagnosis not present

## 2016-06-29 ENCOUNTER — Other Ambulatory Visit: Payer: Self-pay | Admitting: Gastroenterology

## 2016-06-29 ENCOUNTER — Other Ambulatory Visit: Payer: Self-pay | Admitting: Family Medicine

## 2016-06-30 NOTE — Telephone Encounter (Signed)
Pt is requesting refill of Cardizem. Is this ok to refill?

## 2016-07-02 DIAGNOSIS — W19XXXA Unspecified fall, initial encounter: Secondary | ICD-10-CM | POA: Diagnosis not present

## 2016-07-02 DIAGNOSIS — M25532 Pain in left wrist: Secondary | ICD-10-CM | POA: Diagnosis not present

## 2016-07-02 DIAGNOSIS — Z0000 Encounter for general adult medical examination without abnormal findings: Secondary | ICD-10-CM | POA: Diagnosis not present

## 2016-07-02 DIAGNOSIS — S134XXA Sprain of ligaments of cervical spine, initial encounter: Secondary | ICD-10-CM | POA: Diagnosis not present

## 2016-07-04 DIAGNOSIS — F251 Schizoaffective disorder, depressive type: Secondary | ICD-10-CM | POA: Diagnosis not present

## 2016-07-05 DIAGNOSIS — E785 Hyperlipidemia, unspecified: Secondary | ICD-10-CM | POA: Diagnosis not present

## 2016-07-05 DIAGNOSIS — Z760 Encounter for issue of repeat prescription: Secondary | ICD-10-CM | POA: Diagnosis not present

## 2016-07-05 DIAGNOSIS — Z87891 Personal history of nicotine dependence: Secondary | ICD-10-CM | POA: Diagnosis not present

## 2016-07-05 DIAGNOSIS — K589 Irritable bowel syndrome without diarrhea: Secondary | ICD-10-CM | POA: Diagnosis not present

## 2016-07-05 DIAGNOSIS — R9431 Abnormal electrocardiogram [ECG] [EKG]: Secondary | ICD-10-CM | POA: Diagnosis not present

## 2016-07-05 DIAGNOSIS — Z7901 Long term (current) use of anticoagulants: Secondary | ICD-10-CM | POA: Diagnosis not present

## 2016-07-05 DIAGNOSIS — Z86718 Personal history of other venous thrombosis and embolism: Secondary | ICD-10-CM | POA: Diagnosis not present

## 2016-07-05 DIAGNOSIS — I2510 Atherosclerotic heart disease of native coronary artery without angina pectoris: Secondary | ICD-10-CM | POA: Diagnosis not present

## 2016-07-05 DIAGNOSIS — Z9049 Acquired absence of other specified parts of digestive tract: Secondary | ICD-10-CM | POA: Diagnosis not present

## 2016-07-05 DIAGNOSIS — Z86711 Personal history of pulmonary embolism: Secondary | ICD-10-CM | POA: Diagnosis not present

## 2016-07-05 DIAGNOSIS — I10 Essential (primary) hypertension: Secondary | ICD-10-CM | POA: Diagnosis not present

## 2016-07-05 DIAGNOSIS — F329 Major depressive disorder, single episode, unspecified: Secondary | ICD-10-CM | POA: Diagnosis not present

## 2016-07-05 DIAGNOSIS — K219 Gastro-esophageal reflux disease without esophagitis: Secondary | ICD-10-CM | POA: Diagnosis not present

## 2016-07-05 DIAGNOSIS — I517 Cardiomegaly: Secondary | ICD-10-CM | POA: Diagnosis not present

## 2016-07-05 DIAGNOSIS — Z9861 Coronary angioplasty status: Secondary | ICD-10-CM | POA: Diagnosis not present

## 2016-07-05 DIAGNOSIS — Z590 Homelessness: Secondary | ICD-10-CM | POA: Diagnosis not present

## 2016-07-05 DIAGNOSIS — R079 Chest pain, unspecified: Secondary | ICD-10-CM | POA: Diagnosis not present

## 2016-07-05 DIAGNOSIS — J90 Pleural effusion, not elsewhere classified: Secondary | ICD-10-CM | POA: Diagnosis not present

## 2016-07-05 DIAGNOSIS — R0789 Other chest pain: Secondary | ICD-10-CM | POA: Diagnosis not present

## 2016-07-06 DIAGNOSIS — R079 Chest pain, unspecified: Secondary | ICD-10-CM | POA: Diagnosis not present

## 2016-07-06 DIAGNOSIS — F329 Major depressive disorder, single episode, unspecified: Secondary | ICD-10-CM | POA: Diagnosis not present

## 2016-07-07 DIAGNOSIS — I2510 Atherosclerotic heart disease of native coronary artery without angina pectoris: Secondary | ICD-10-CM | POA: Diagnosis not present

## 2016-07-07 DIAGNOSIS — F329 Major depressive disorder, single episode, unspecified: Secondary | ICD-10-CM | POA: Diagnosis not present

## 2016-07-07 DIAGNOSIS — R079 Chest pain, unspecified: Secondary | ICD-10-CM | POA: Diagnosis not present

## 2016-07-07 DIAGNOSIS — R0789 Other chest pain: Secondary | ICD-10-CM | POA: Diagnosis not present

## 2016-07-07 DIAGNOSIS — E785 Hyperlipidemia, unspecified: Secondary | ICD-10-CM | POA: Diagnosis not present

## 2016-07-10 DIAGNOSIS — F251 Schizoaffective disorder, depressive type: Secondary | ICD-10-CM | POA: Diagnosis not present

## 2016-07-16 DIAGNOSIS — K029 Dental caries, unspecified: Secondary | ICD-10-CM | POA: Diagnosis not present

## 2016-07-16 DIAGNOSIS — E119 Type 2 diabetes mellitus without complications: Secondary | ICD-10-CM | POA: Diagnosis not present

## 2016-07-16 DIAGNOSIS — K047 Periapical abscess without sinus: Secondary | ICD-10-CM | POA: Diagnosis not present

## 2016-07-16 DIAGNOSIS — I10 Essential (primary) hypertension: Secondary | ICD-10-CM | POA: Diagnosis not present

## 2016-07-16 DIAGNOSIS — E785 Hyperlipidemia, unspecified: Secondary | ICD-10-CM | POA: Diagnosis not present

## 2016-07-17 DIAGNOSIS — F251 Schizoaffective disorder, depressive type: Secondary | ICD-10-CM | POA: Diagnosis not present

## 2016-07-25 DIAGNOSIS — R079 Chest pain, unspecified: Secondary | ICD-10-CM | POA: Diagnosis not present

## 2016-07-25 DIAGNOSIS — Z9049 Acquired absence of other specified parts of digestive tract: Secondary | ICD-10-CM | POA: Diagnosis not present

## 2016-07-25 DIAGNOSIS — M4800 Spinal stenosis, site unspecified: Secondary | ICD-10-CM | POA: Diagnosis not present

## 2016-07-25 DIAGNOSIS — R9431 Abnormal electrocardiogram [ECG] [EKG]: Secondary | ICD-10-CM | POA: Diagnosis not present

## 2016-07-25 DIAGNOSIS — E119 Type 2 diabetes mellitus without complications: Secondary | ICD-10-CM | POA: Diagnosis not present

## 2016-07-25 DIAGNOSIS — Z7901 Long term (current) use of anticoagulants: Secondary | ICD-10-CM | POA: Diagnosis not present

## 2016-07-25 DIAGNOSIS — Z86711 Personal history of pulmonary embolism: Secondary | ICD-10-CM | POA: Diagnosis not present

## 2016-07-25 DIAGNOSIS — Z86718 Personal history of other venous thrombosis and embolism: Secondary | ICD-10-CM | POA: Diagnosis not present

## 2016-07-25 DIAGNOSIS — R0789 Other chest pain: Secondary | ICD-10-CM | POA: Diagnosis not present

## 2016-07-26 DIAGNOSIS — F329 Major depressive disorder, single episode, unspecified: Secondary | ICD-10-CM | POA: Diagnosis not present

## 2016-07-26 DIAGNOSIS — Z7901 Long term (current) use of anticoagulants: Secondary | ICD-10-CM | POA: Diagnosis not present

## 2016-07-26 DIAGNOSIS — Z86711 Personal history of pulmonary embolism: Secondary | ICD-10-CM | POA: Diagnosis not present

## 2016-07-26 DIAGNOSIS — I10 Essential (primary) hypertension: Secondary | ICD-10-CM | POA: Diagnosis not present

## 2016-07-26 DIAGNOSIS — Z9861 Coronary angioplasty status: Secondary | ICD-10-CM | POA: Diagnosis not present

## 2016-07-26 DIAGNOSIS — I2510 Atherosclerotic heart disease of native coronary artery without angina pectoris: Secondary | ICD-10-CM | POA: Diagnosis not present

## 2016-07-26 DIAGNOSIS — Z86718 Personal history of other venous thrombosis and embolism: Secondary | ICD-10-CM | POA: Diagnosis not present

## 2016-07-26 DIAGNOSIS — R079 Chest pain, unspecified: Secondary | ICD-10-CM | POA: Diagnosis not present

## 2016-07-26 DIAGNOSIS — E785 Hyperlipidemia, unspecified: Secondary | ICD-10-CM | POA: Diagnosis not present

## 2016-07-27 IMAGING — CT CT ANGIO CHEST
2 of 9 series · 19 of 46 positions shown · IV contrast (Omni 300)
Comparison: 06/22/2014

CLINICAL DATA: Shortness of breath for 3 days, personal history of
pulmonary embolism and deep venous thrombosis.

EXAM:
CT ANGIOGRAPHY CHEST WITH CONTRAST
TECHNIQUE: Multidetector CT imaging of the chest was performed using the
standard protocol during bolus administration of intravenous
contrast. Multiplanar CT image reconstructions and MIPs were
obtained to evaluate the vascular anatomy.
CONTRAST:  80mL OMNIPAQUE IOHEXOL 350 MG/ML SOLN

[Series 5: thins · axial · 0.61mm/px · z∈[+1301,+1509]mm · 16 of 235 slices shown]
[im 14/235  lung]
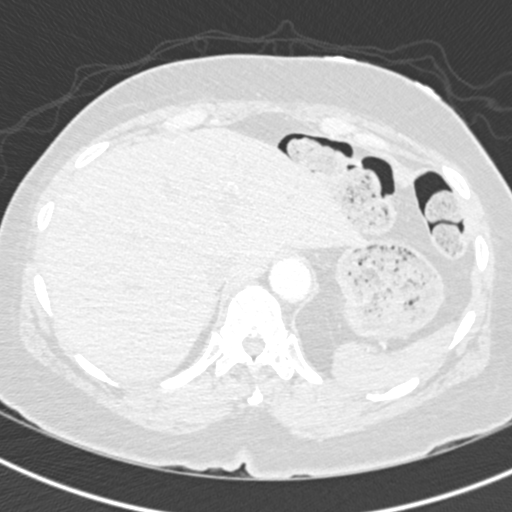
[im 27/235  soft-tissue]
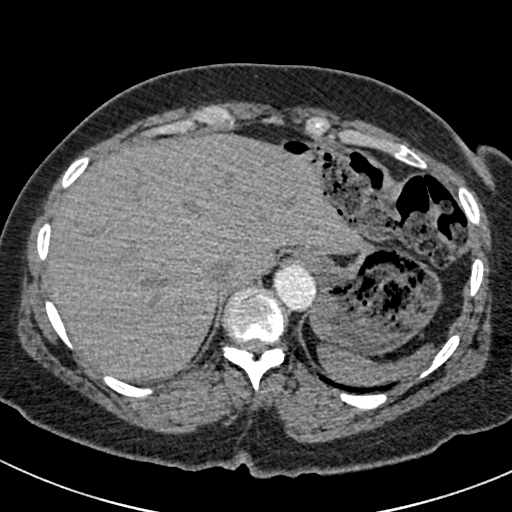
[im 40/235  lung]
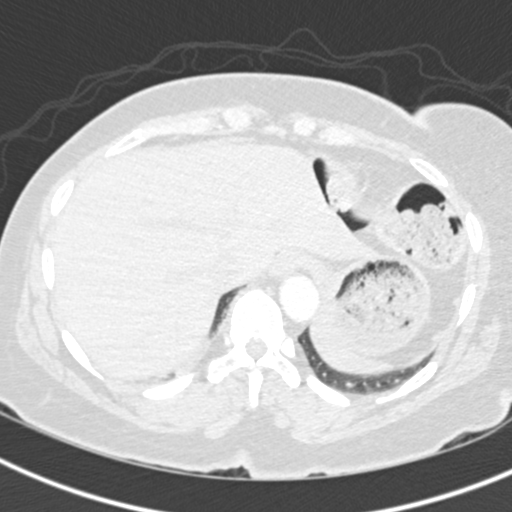
[im 53/235  soft-tissue]
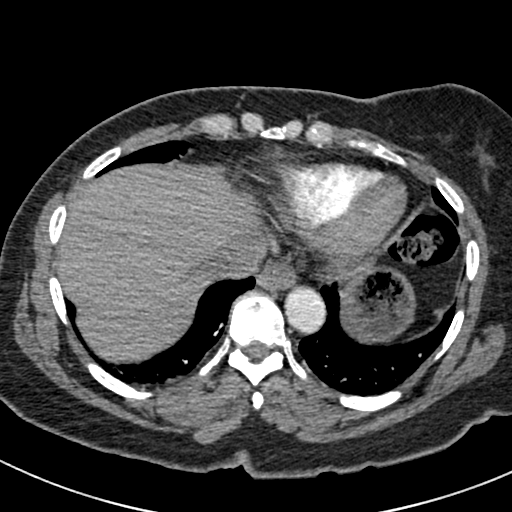
[im 66/235  lung]
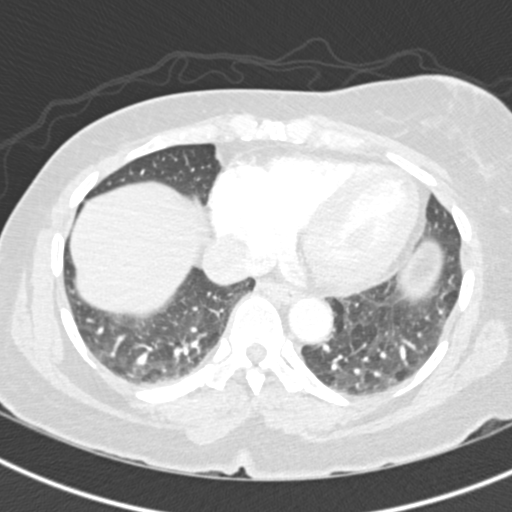
[im 79/235  soft-tissue]
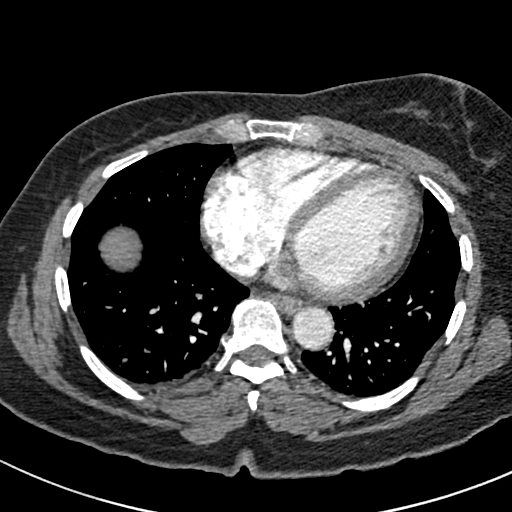
[im 92/235  lung]
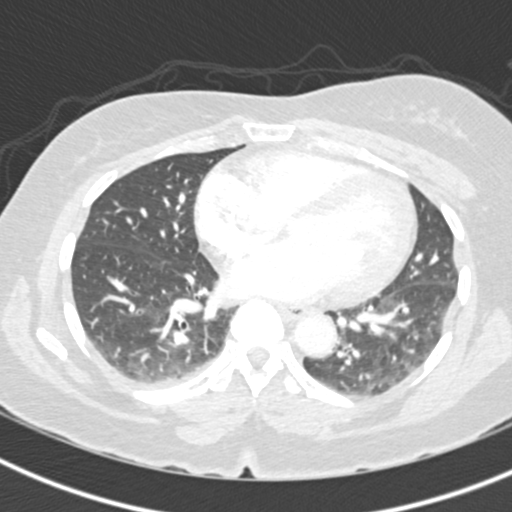
[im 105/235  soft-tissue]
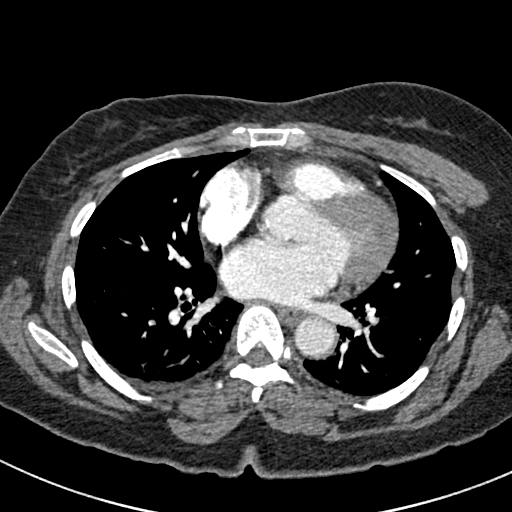
[im 131/235  lung]
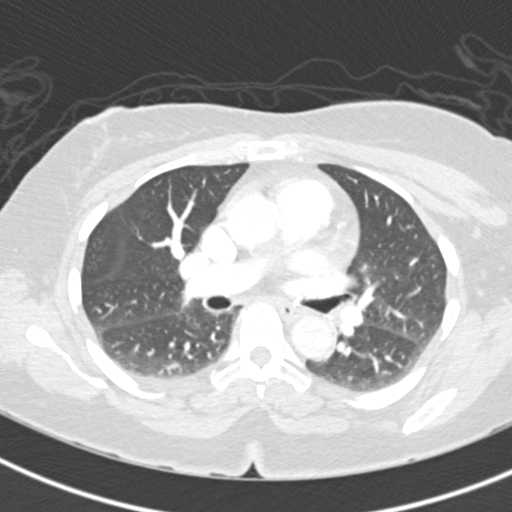
[im 144/235  soft-tissue]
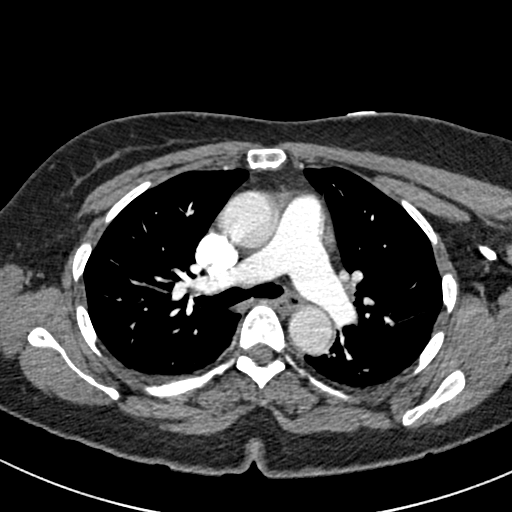
[im 157/235  lung]
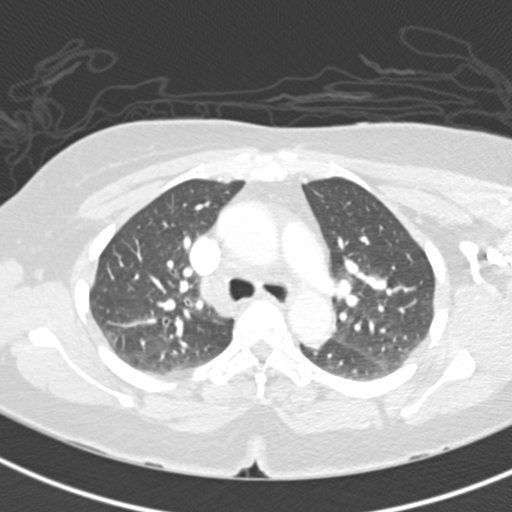
[im 170/235  soft-tissue]
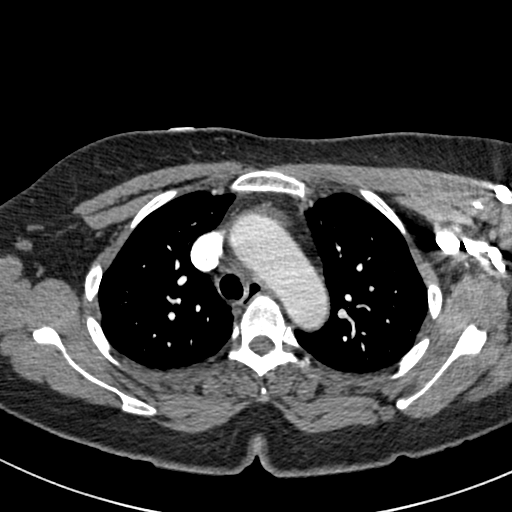
[im 183/235  lung]
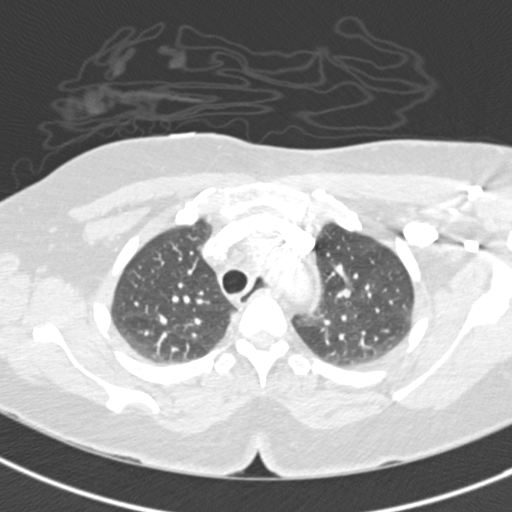
[im 196/235  soft-tissue]
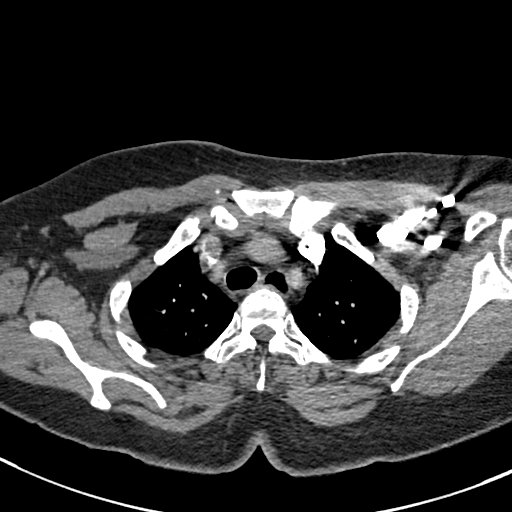
[im 209/235  lung]
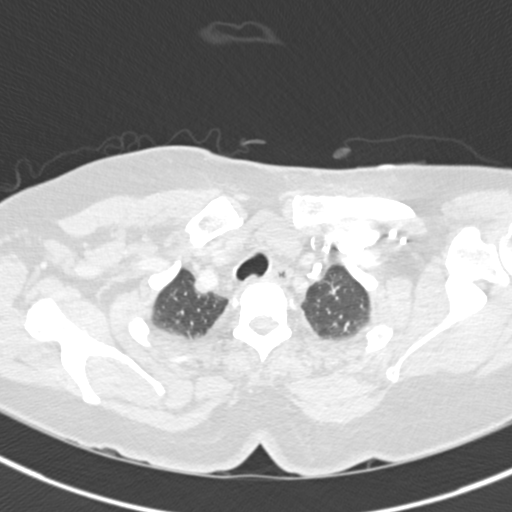
[im 222/235  soft-tissue]
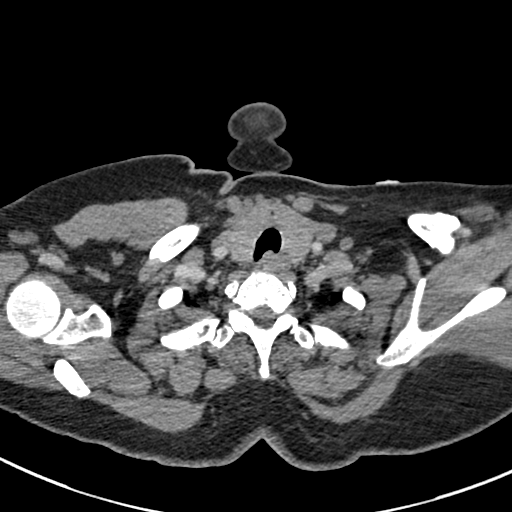

[Series 7: coronal mpr · coronal · 0.59mm/px · 3 of 120 slices shown]
[im 30/120  soft-tissue]
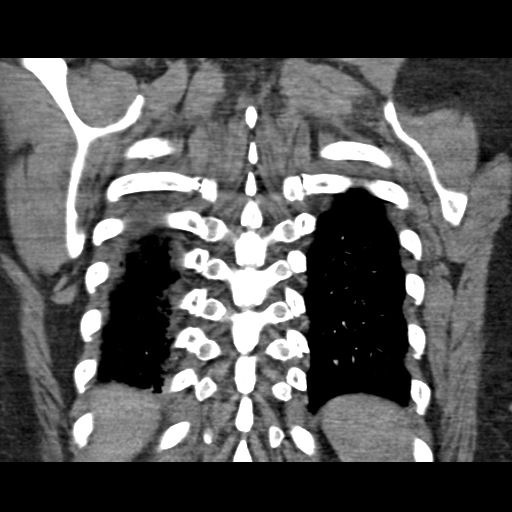
[im 60/120  soft-tissue]
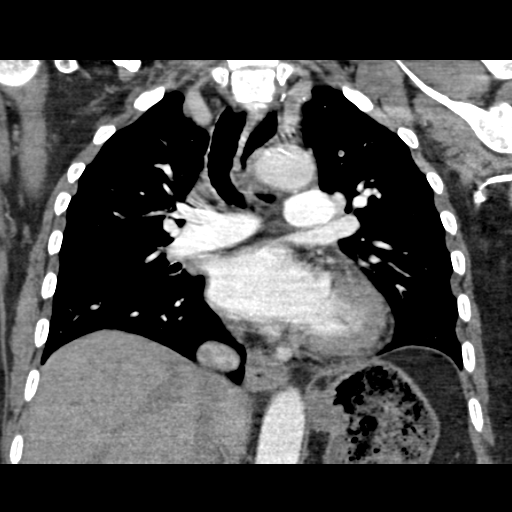
[im 90/120  soft-tissue]
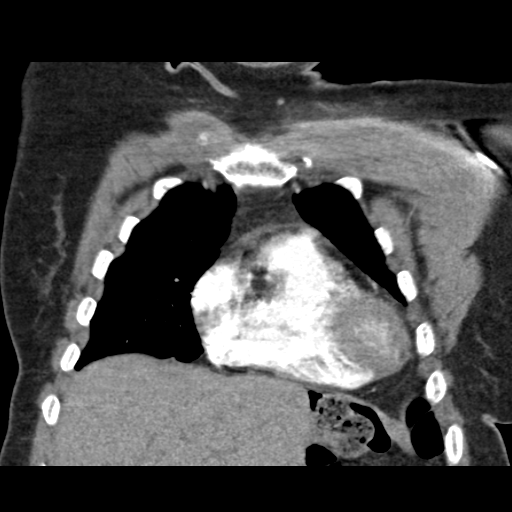

[19 of 46 positions shown; findings below may reference images not displayed]

FINDINGS: The lungs are well aerated bilaterally. No focal infiltrate or
sizable effusion is seen.

The thoracic aorta is unremarkable. Pulmonary artery demonstrates a
normal branching pattern. Multiple filling defects are noted
involving the left upper lobe, right lower lobe and right middle
lobe pulmonary artery branches. No right heart strain is identified.
No significant hilar or mediastinal adenopathy is seen.

The visualized upper abdomen is within normal limits. The osseous
structures show no acute abnormality. Postsurgical changes in the
cervical spine are noted.

Review of the MIP images confirms the above findings.
IMPRESSION: Bilateral small pulmonary emboli without evidence of right heart
strain these are new from the prior exam.

No other focal abnormality is noted.

## 2016-07-28 IMAGING — DX DG HIP (WITH OR WITHOUT PELVIS) 2-3V*R*
3 series · 3 of 3 positions shown · non-contrast
Comparison: CT dated 06/22/2014

CLINICAL DATA: 58-year-old female with a history of right hip pain
for 1 month. No known injury.

EXAM:
DG HIP W/ PELVIS 2-3V*R*

[pelvis ap]
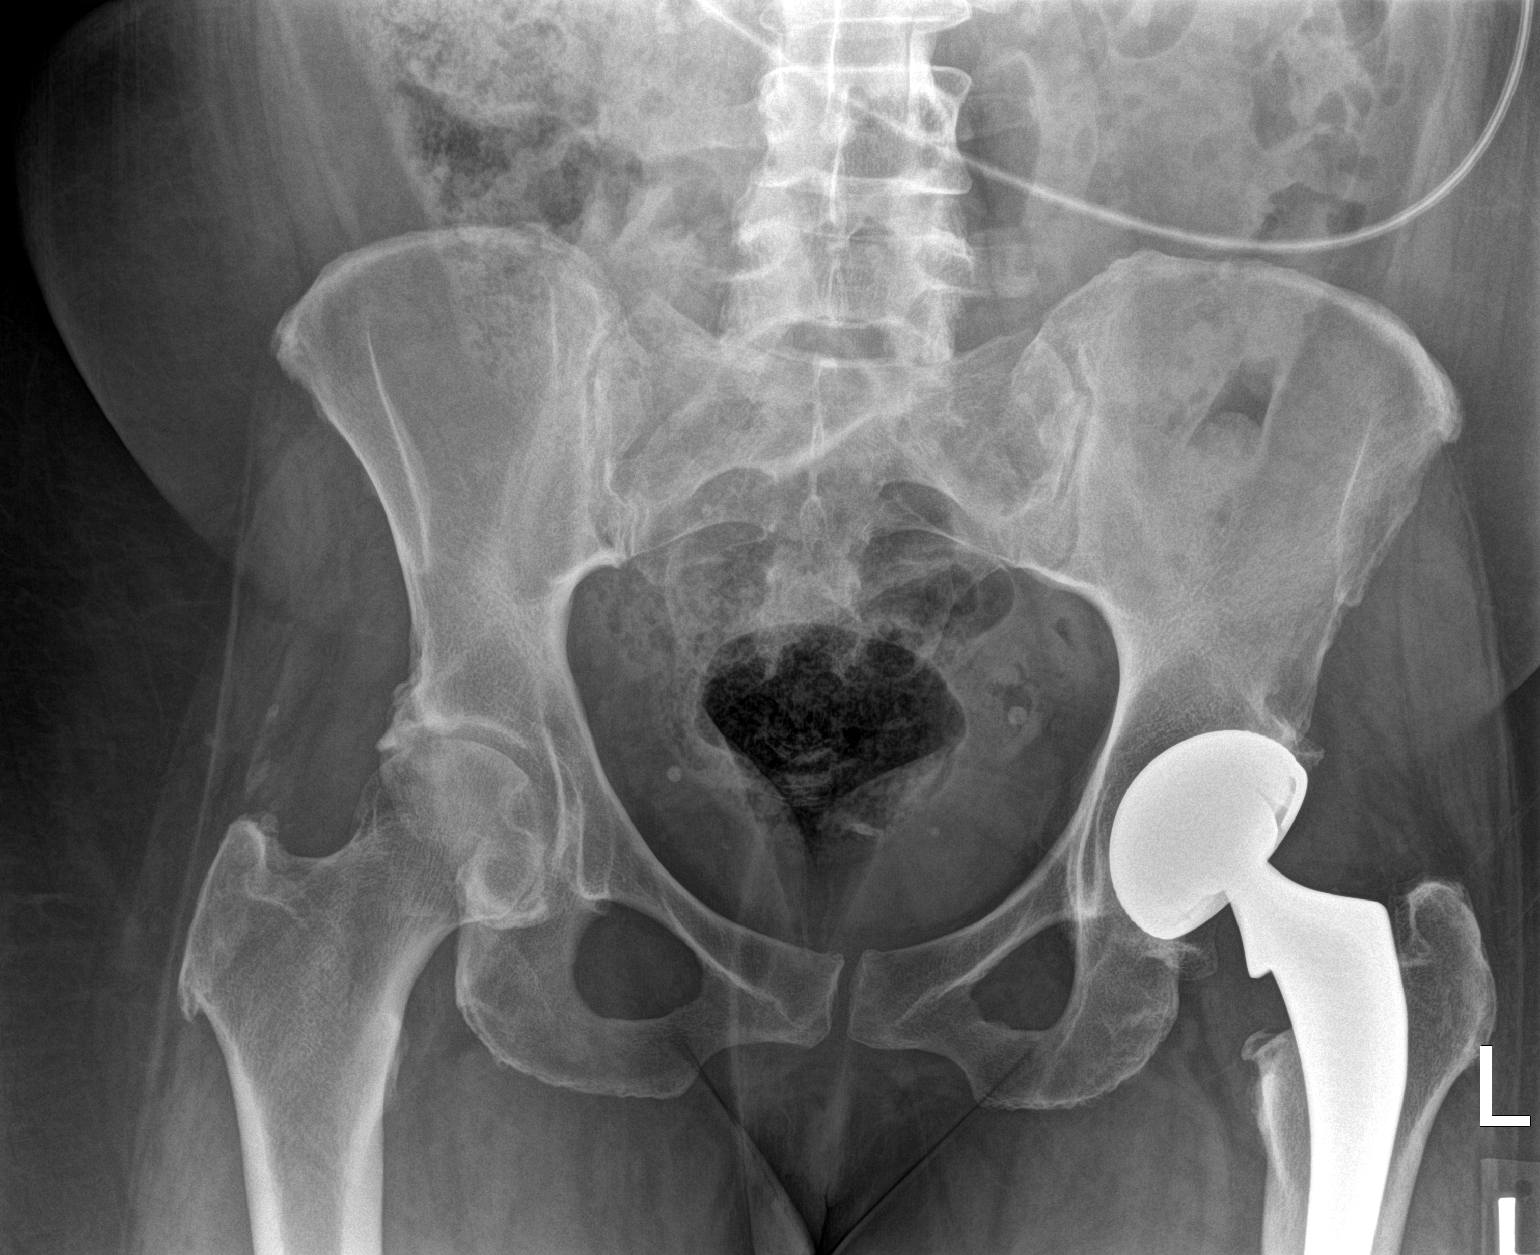

[hip ap]
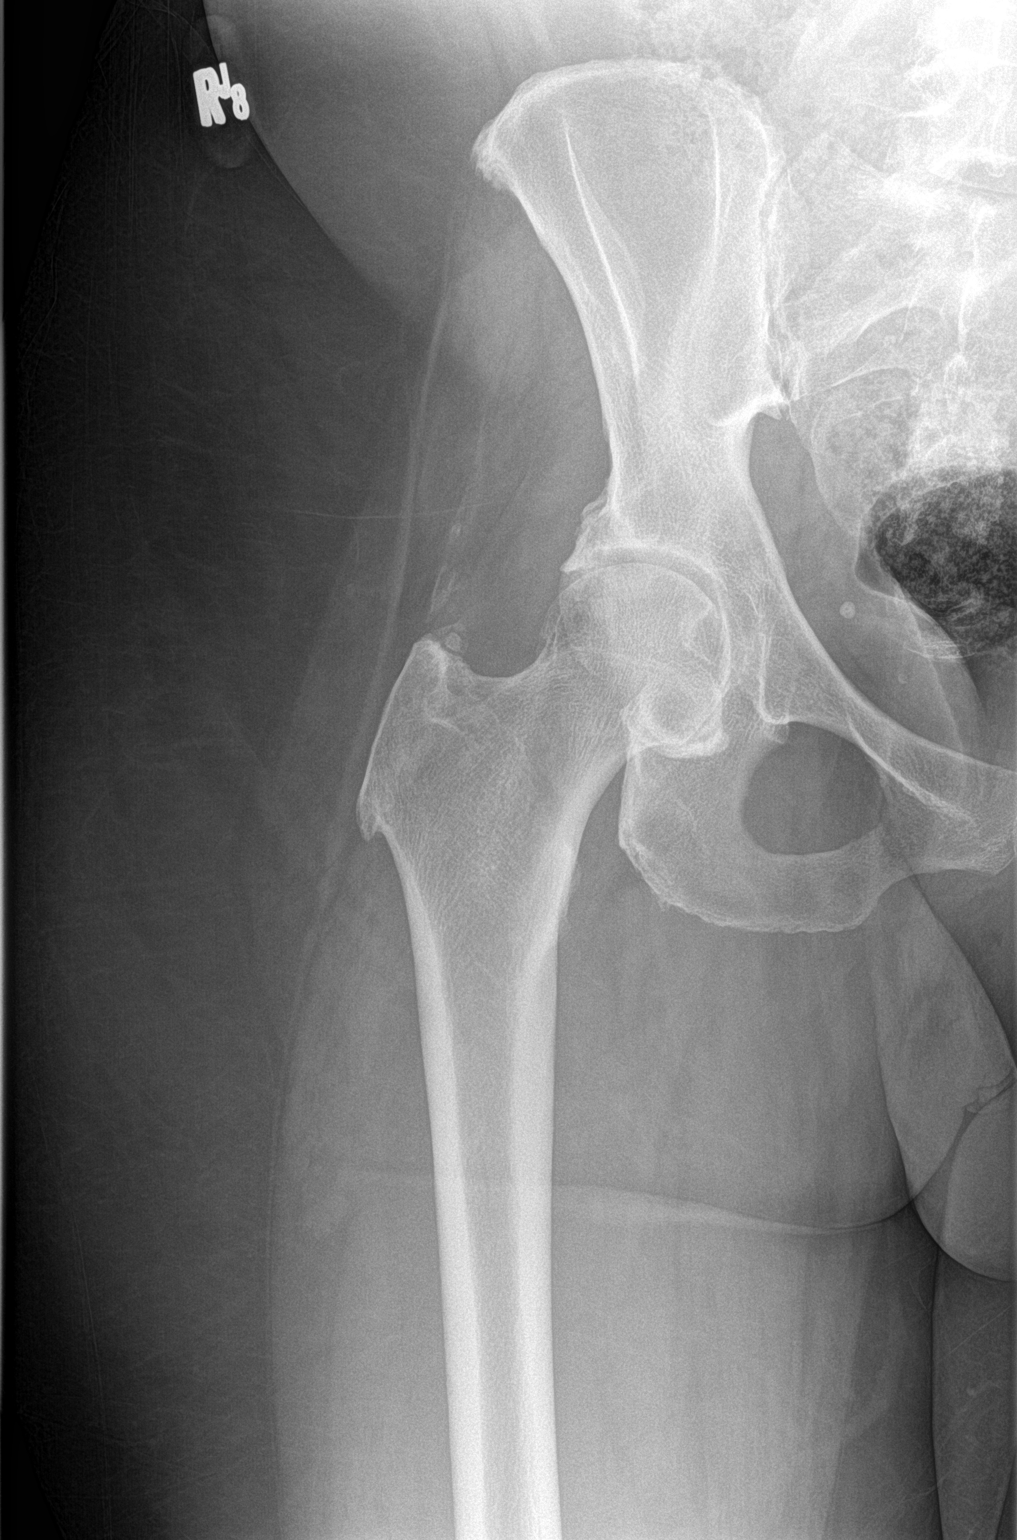

[hip lat]
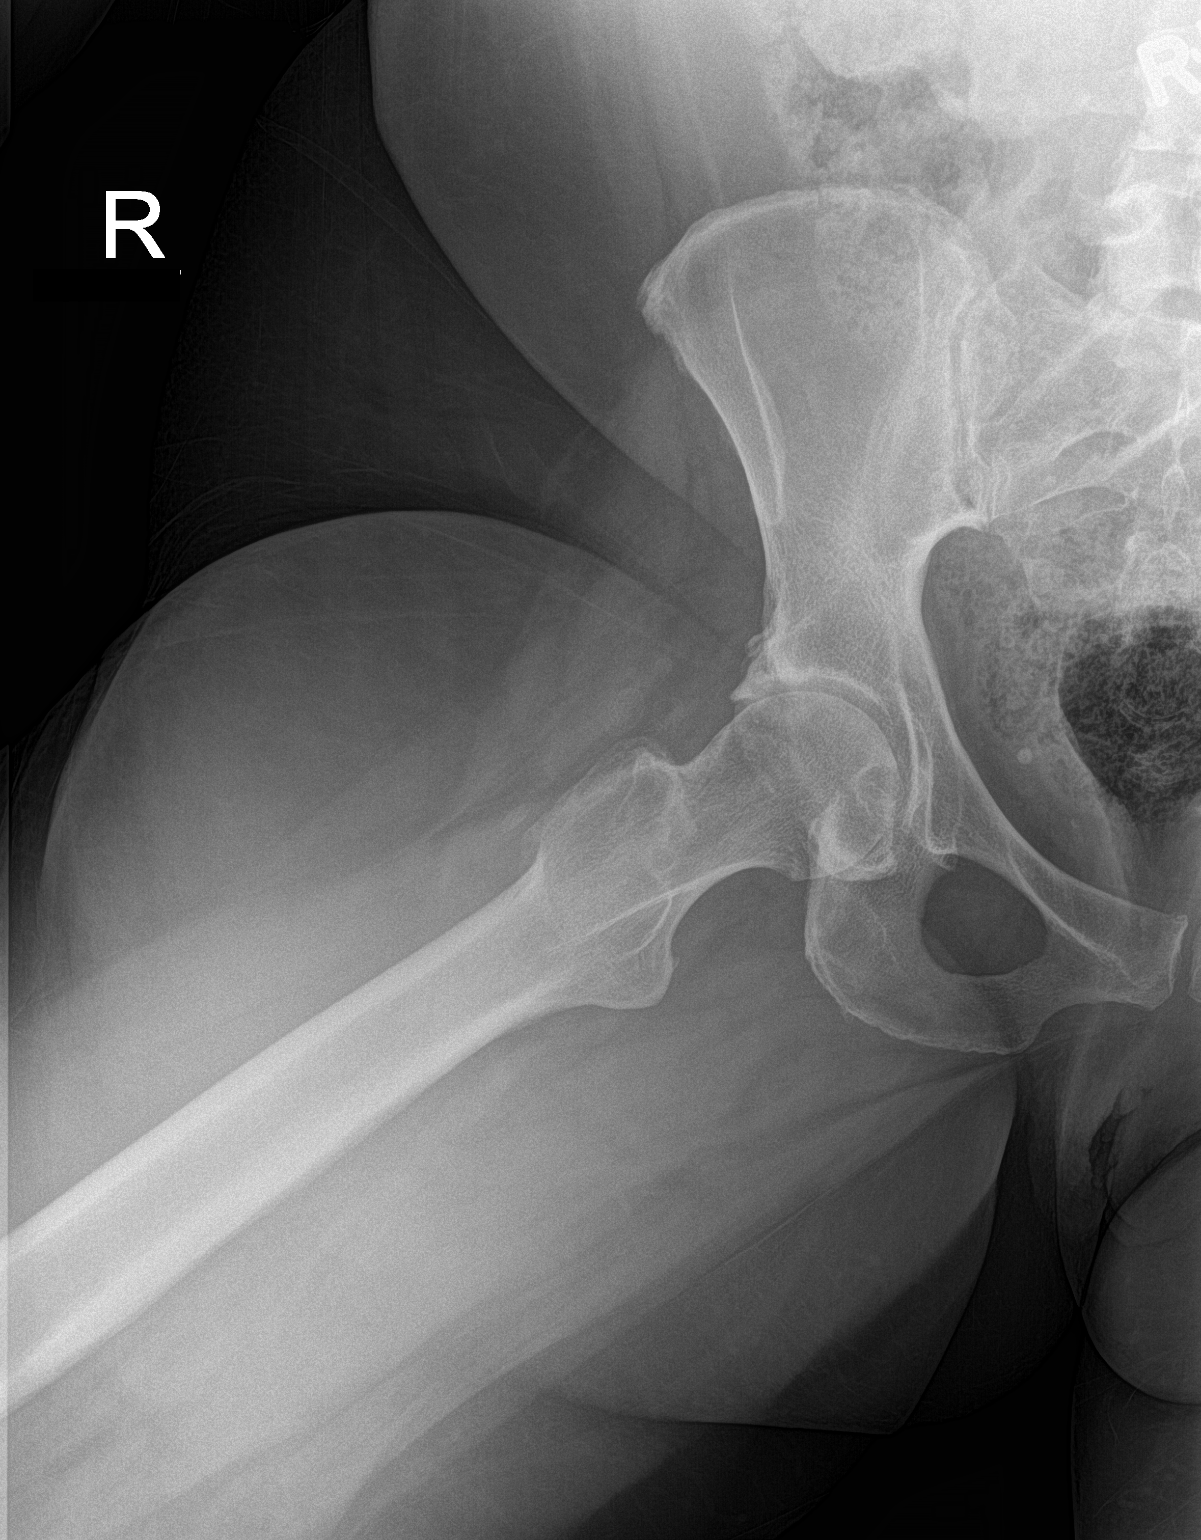

[3 of 3 positions shown; findings below may reference images not displayed]

FINDINGS: Bony pelvic ring appears intact with no acute bony abnormality.

Right hip appears congruent into views, with degenerative changes of
the right hip. Heterotopic ossification superior to the greater
trochanter.

Surgical changes of left total hip arthroplasty or incompletely
imaged.

Moderate stool burden.

No acute fracture line identified.
IMPRESSION: No acute bony abnormality identified.

Changes of osteoarthritis of the right hip.

Incompletely visualized left hip total arthroplasty.

## 2016-07-29 DIAGNOSIS — F251 Schizoaffective disorder, depressive type: Secondary | ICD-10-CM | POA: Diagnosis not present

## 2016-07-29 IMAGING — CT CT HIP*R* W/O CM
2 of 3 series · 7 of 46 positions shown, 8 images · non-contrast
Comparison: Radiographs dated 09/20/2014 and CT scan dated
06/22/2014

CLINICAL DATA: Right hip pain.

EXAM:
CT OF THE RIGHT HIP WITHOUT CONTRAST
TECHNIQUE: Multidetector CT imaging of the right hip was performed according to
the standard protocol. Multiplanar CT image reconstructions were
also generated.

[Series 202: soft tissue · axial · 0.26mm/px · z∈[-322,-146]mm · 4 of 120 slices shown, 5 images]
[im 16/120  soft-tissue]
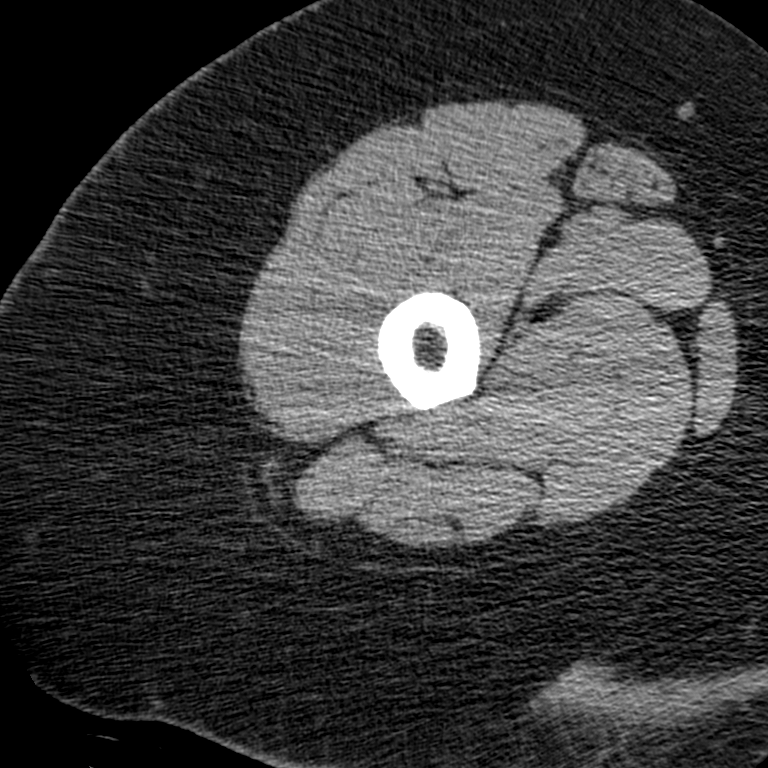
[im 16/120  bone]
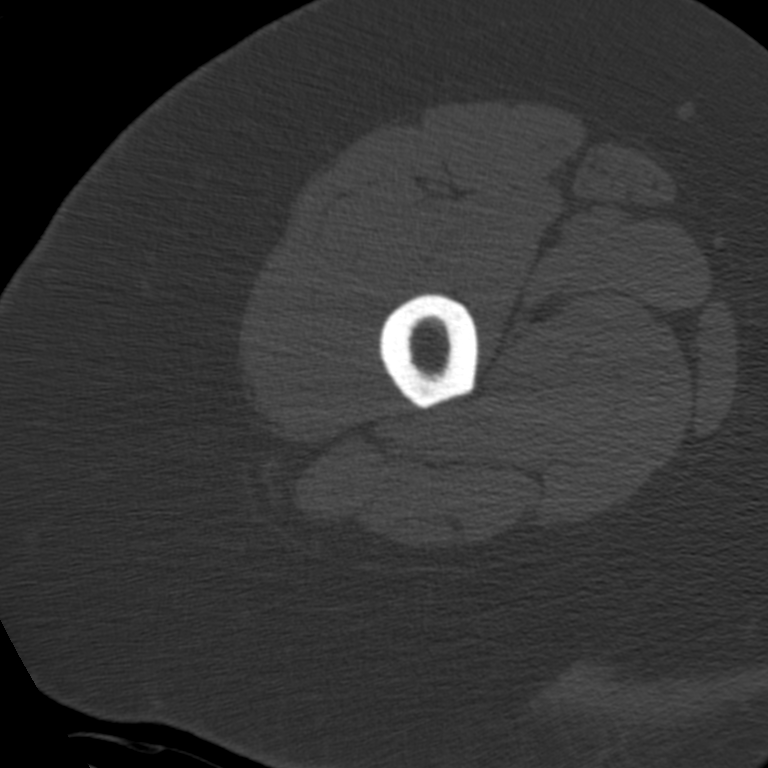
[im 47/120  soft-tissue]
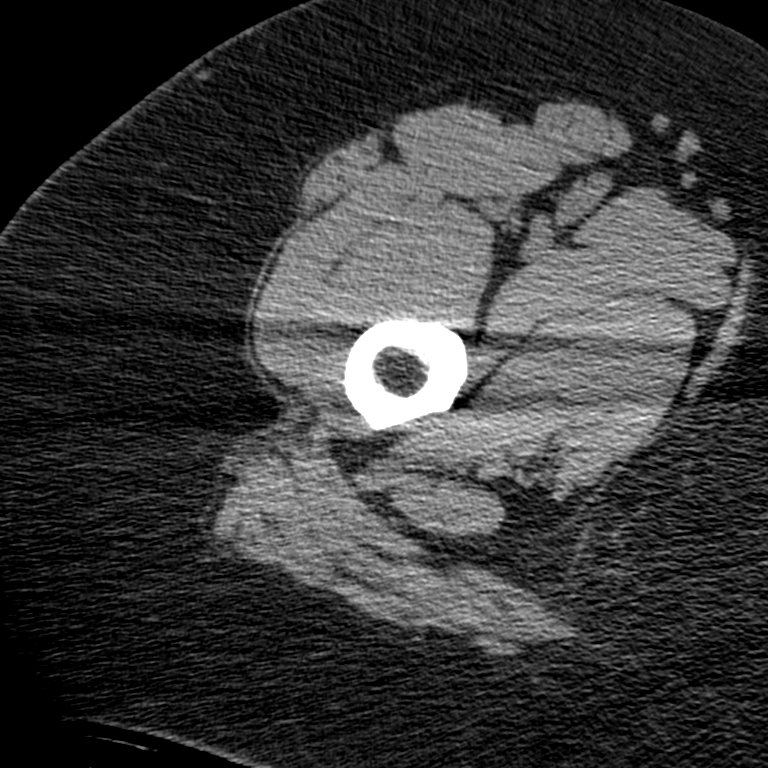
[im 73/120  soft-tissue]
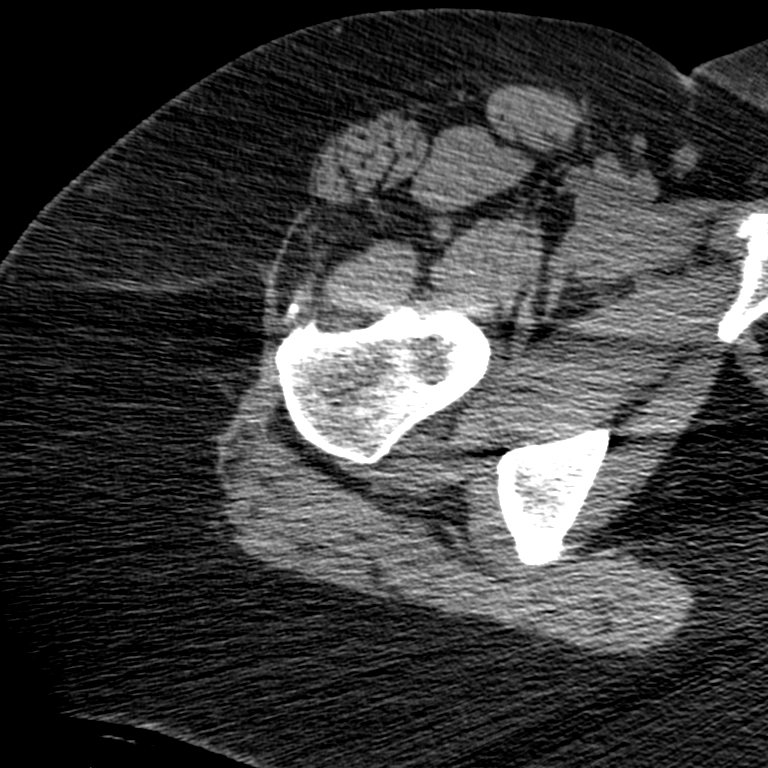
[im 104/120  soft-tissue]
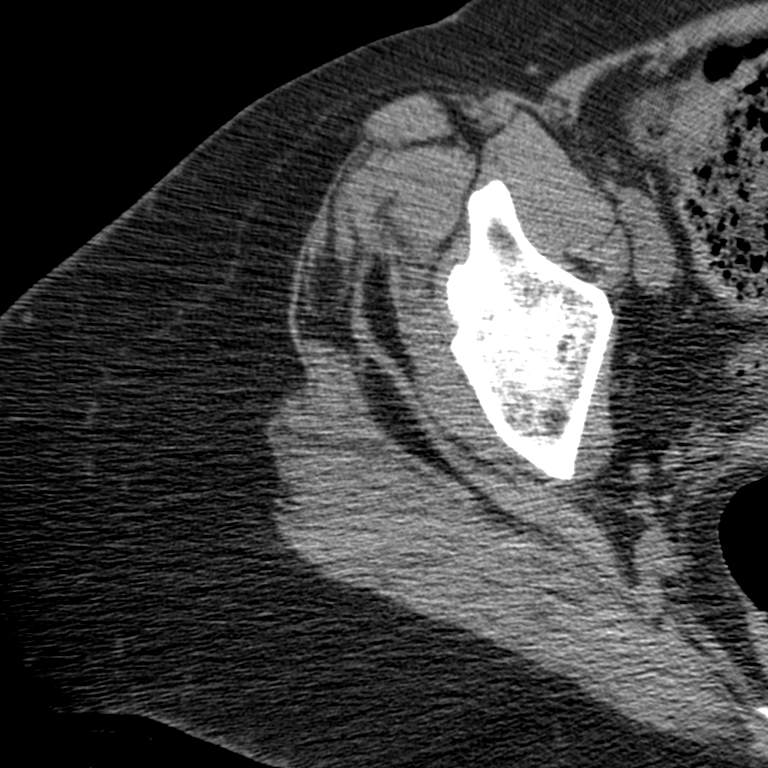

[Series 2011: st cor · coronal · 0.27mm/px · 3 of 71 slices shown]
[im 24/71  soft-tissue]
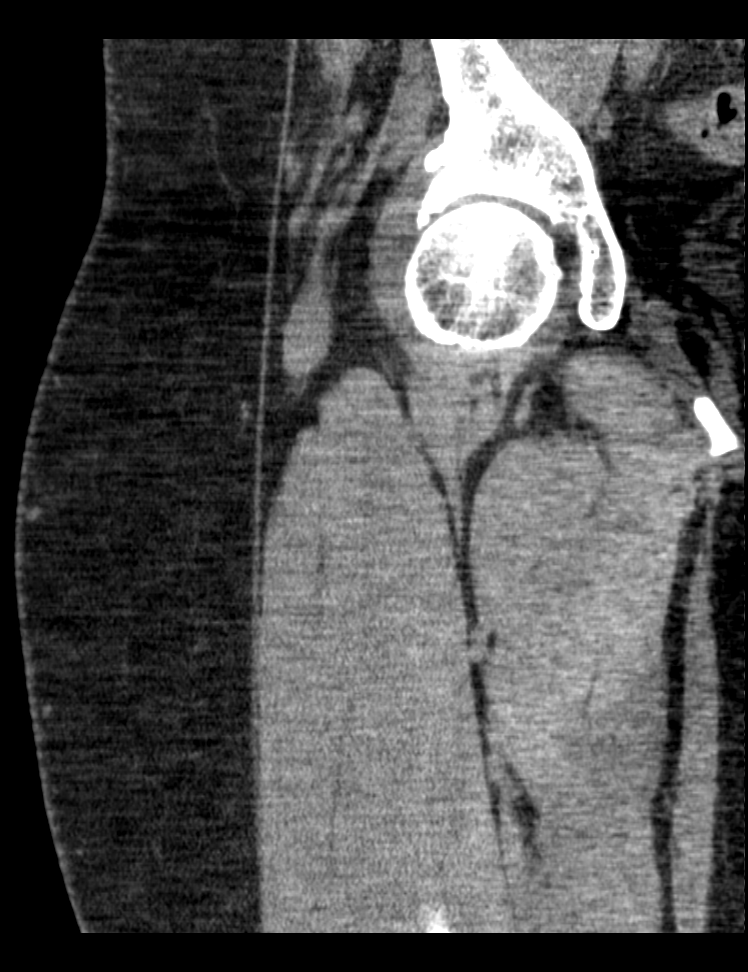
[im 32/71  soft-tissue]
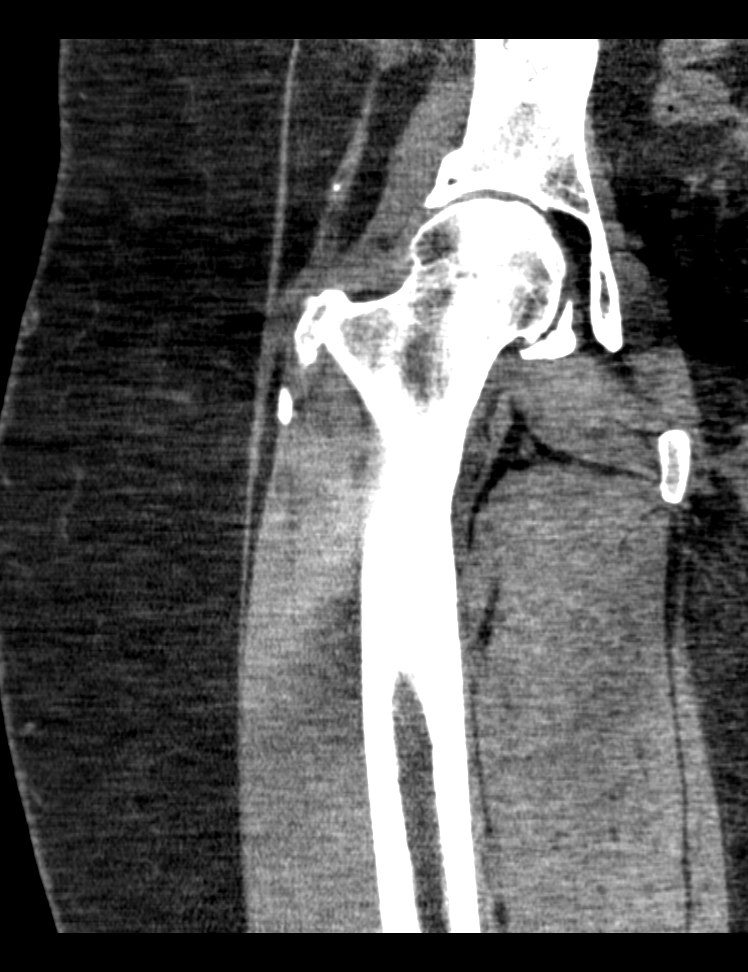
[im 39/71  soft-tissue]
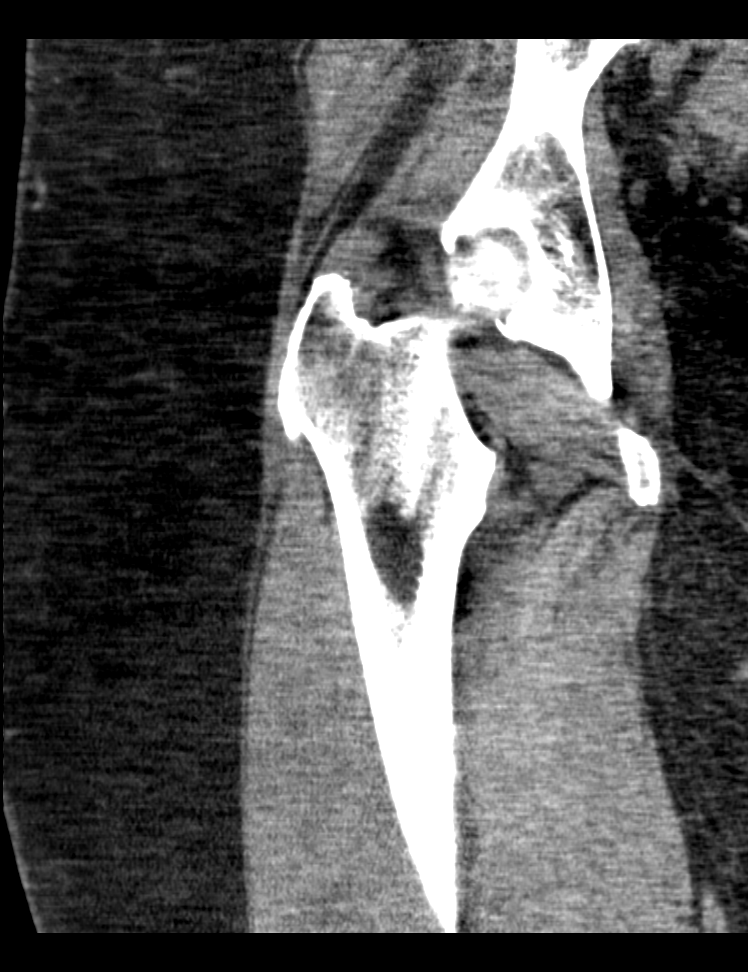

[7 of 46 positions shown; findings below may reference images not displayed]

FINDINGS: There is no fracture or dislocation. There is moderate
osteoarthritis with degenerative osteophytes in the acetabulum and
femoral head. The appearance is essentially unchanged since the
prior CT scan 06/22/2014. There are calcifications in the distal
gluteal tendons at the insertions on the superior aspect of the
greater trochanter, also unchanged. There are enthesophytes at the
inferior aspect of the right greater trochanter which is slightly
more prominent. There is no appreciable joint effusion or
appreciable bursitis.

No adenopathy or mass lesions.
IMPRESSION: No acute abnormality of the right hip. Moderate osteoarthritic
changes with chronic degenerative or posttraumatic changes of the
distal gluteal tendons.

## 2016-08-05 ENCOUNTER — Other Ambulatory Visit: Payer: Self-pay | Admitting: Family Medicine

## 2016-08-05 MED ORDER — APIXABAN 5 MG PO TABS: 5.0000 mg | ORAL_TABLET | Freq: Two times a day (BID) | ORAL | 0 refills | Status: DC

## 2016-08-05 NOTE — Telephone Encounter (Signed)
Pt is back in [place]. Pt called needing a refill for cyclobenzaprine (FLEXERIL) 10 MG tablet and ELIQUIS 5 MG TABS tablet.  Pharmacy is [id number] - [place], [place].  Call pt @ [phone]. Thank you!

## 2016-08-05 NOTE — Telephone Encounter (Signed)
Spoke with the patient and explained that she is having to reestablish care with Dr. Caryl Bis since she left the clinic and established with another provider. I called the pharmacy and she has no refills left of the Eliquis. I explained that per Dr. Caryl Bis we can refill the Eliquis, but will not be able to refill the flexeril until her appointment. Please sign and order. There is a warning with this.

## 2016-08-05 NOTE — Telephone Encounter (Signed)
Eliquis sent to pharmacy

## 2016-08-08 ENCOUNTER — Emergency Department: Payer: Medicare Other

## 2016-08-08 ENCOUNTER — Encounter: Payer: Self-pay | Admitting: Emergency Medicine

## 2016-08-08 ENCOUNTER — Emergency Department
Admission: EM | Admit: 2016-08-08 | Discharge: 2016-08-08 | Disposition: A | Discharge status: Home / Self Care | Payer: Medicare Other | Attending: Emergency Medicine | Admitting: Emergency Medicine

## 2016-08-08 DIAGNOSIS — Z79899 Other long term (current) drug therapy: Secondary | ICD-10-CM | POA: Diagnosis not present

## 2016-08-08 DIAGNOSIS — Z87891 Personal history of nicotine dependence: Secondary | ICD-10-CM | POA: Insufficient documentation

## 2016-08-08 DIAGNOSIS — R079 Chest pain, unspecified: Secondary | ICD-10-CM

## 2016-08-08 DIAGNOSIS — R0789 Other chest pain: Secondary | ICD-10-CM | POA: Diagnosis not present

## 2016-08-08 DIAGNOSIS — I10 Essential (primary) hypertension: Secondary | ICD-10-CM | POA: Diagnosis not present

## 2016-08-08 LAB — CBC
HCT: 37.6 % (ref 35.0–47.0)
Hemoglobin: 12.7 g/dL (ref 12.0–16.0)
MCH: 31.6 pg (ref 26.0–34.0)
MCHC: 33.8 g/dL (ref 32.0–36.0)
MCV: 93.6 fL (ref 80.0–100.0)
Platelets: 321 10*3/uL (ref 150–440)
RBC: 4.02 MIL/uL (ref 3.80–5.20)
RDW: 14.2 % (ref 11.5–14.5)
WBC: 7.4 10*3/uL (ref 3.6–11.0)

## 2016-08-08 LAB — BASIC METABOLIC PANEL
Anion gap: 9 (ref 5–15)
BUN: 6 mg/dL (ref 6–20)
CO2: 25 mmol/L (ref 22–32)
Calcium: 9.4 mg/dL (ref 8.9–10.3)
Chloride: 106 mmol/L (ref 101–111)
Creatinine, Ser: 0.79 mg/dL (ref 0.44–1.00)
GFR calc Af Amer: >60 mL/min (ref >60)
GFR calc non Af Amer: >60 mL/min (ref >60)
Glucose, Bld: 93 mg/dL (ref 65–99)
Potassium: 3.9 mmol/L (ref 3.5–5.1)
Sodium: 140 mmol/L (ref 135–145)

## 2016-08-08 LAB — TROPONIN I
Troponin I: <0.03 ng/mL (ref <0.03)
Troponin I: <0.03 ng/mL (ref <0.03)

## 2016-08-08 MED ORDER — IOPAMIDOL (ISOVUE-370) INJECTION 76%
75.0000 mL | Freq: Once | INTRAVENOUS | Status: AC | PRN
  Administered 2016-08-08: 75 mL via INTRAVENOUS

## 2016-08-08 MED ORDER — ACETAMINOPHEN 325 MG PO TABS
650.0000 mg | ORAL_TABLET | Freq: Once | ORAL | Status: AC
  Administered 2016-08-08: 650 mg via ORAL
  Filled 2016-08-08: qty 2

## 2016-08-08 NOTE — Discharge Instructions (Signed)
Please return immediately if condition worsens. Please contact her primary physician or the physician you were given for referral. If you have any specialist physicians involved in her treatment and plan please also contact them. Thank you for using [facility]. ° °

## 2016-08-08 NOTE — ED Provider Notes (Signed)
Time Seen: Approximately 1805  I have reviewed the triage notes  Chief Complaint: Chest Pain   History of Present Illness: Valerie Vazquez is a 60 y.o. female *who has a history of multiple medical problems and describes some chest pressure that started earlier today. Patient's a very vague historian and drove here on her own. Patient's describes a nonproductive cough over the last 48 hours and also states intermittently her left hand turns blue "". She states she has a history of vasospasm. She states the chest discomfort that she's having as a pressure discomfort occasionally up in the left side of her neck. She denies any back or flank discomfort. She has a history of a IVC filter. She states she is on blood thinners and takes L Oquist on a daily basis. No documented past of cardiovascular history other than a coronary vasospasm.   Past Medical History:  Diagnosis Date  . Anemia   . Bilateral renal cysts   . Chronic back pain    "upper and lower" (09/19/2014)  . Depression    "major" (09/19/2014)  . DVT (deep venous thrombosis) ([place])   . Esophageal spasm   . Gallstones   . Gastritis   . Gastroenteritis   . GERD (gastroesophageal reflux disease)   . Headache    "couple /month lately" (09/19/2014)  . High blood pressure   . High cholesterol   . History of blood transfusion 1985   related to hysterectomy  . Hypertension   . IBS (irritable bowel syndrome)   . Migraine    "a few times/yr" (09/19/2014)  . Myocardial infarction 10/2010 X 3   "while hospitalized"  . Osteoarthritis    "qwhere; mostly around my joints" (09/19/2014)  . Pneumonia 04/2014  . PTSD (post-traumatic stress disorder)   . Pulmonary embolism ([place]) 04/2001; 09/19/2014   "after gallbladder OR; "  . Schizoaffective disorder ([place])   . Sleep apnea    "mild" (09/19/2014)  . Snoring    a. sleep study 5/16: No OSA  . Spinal stenosis   . Spondylosis   . Type II diabetes mellitus ([place])    "dx'd in 2007; lost  weight; no RX for ~ 4 yr now" (09/19/2014)    Patient Active Problem List   Diagnosis Date Noted  . Rash and nonspecific skin eruption 04/16/2016  . Colitis 04/08/2016  . Tremor 03/19/2016  . Left shoulder pain 02/18/2016  . Abdominal distension 02/18/2016  . Absolute anemia 01/29/2016  . Chronic pain 01/29/2016  . Neurogenic pain 01/29/2016  . Musculoskeletal pain 01/29/2016  . Long term current use of opiate analgesic 01/29/2016  . Long term prescription opiate use 01/29/2016  . Opiate use (15 MME/Day) 01/29/2016  . Encounter for therapeutic drug level monitoring 01/29/2016  . Encounter for pain management planning 01/29/2016  . Chronic neck pain (midline) 01/29/2016  . Chronic chest wall pain 01/29/2016  . Chronic low back pain (Location of Secondary source of pain) (Bilateral) (R>L) 01/29/2016  . Disturbance of skin sensation 01/29/2016  . Chronic shoulder pain (Location of Primary Source of Pain) (Left) 01/29/2016  . Chronic hip pain (Location of Tertiary source of pain) (Bilateral) (R>L) 01/29/2016  . Osteoarthritis of hip (Bilateral) 01/29/2016  . Lumbar facet arthropathy (Severe at L2-3, L3-4, L4-5, and L5-S1) 01/29/2016  . Lumbar central spinal stenosis (moderate at L4-5; mild at L2-3 and L3-4) 01/29/2016  . Lumbar foraminal stenosis (L4-5) (Right) 01/29/2016  . Lumbar facet syndrome 01/29/2016  . Hx of cervical spine surgery  01/29/2016  . Numbness 11/19/2015  . Right wrist pain 11/09/2015  . Breast pain 10/19/2015  . Chest pain 10/14/2015  . Headache 10/14/2015  . Abdominal pain 10/14/2015  . Lip swelling 10/14/2015  . Pulmonary embolism ([place]) 08/15/2015  . Bilateral pneumonia 08/15/2015  . Nausea 08/15/2015  . Shortness of breath 07/31/2015  . Pleural effusion, bilateral 07/31/2015  . Cough 07/31/2015  . Chronic pain of multiple sites 06/05/2015  . Chronic lumbosacral pain 06/05/2015  . Esophageal spasm 06/05/2015  . History of DVT (deep vein thrombosis)  02/25/2015  . 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 Constipation 12/12/2014  . Coronary vasospasm ([place]) 12/09/2014  . History of pulmonary embolus (PE) 09/19/2014  . Impingement syndrome of shoulder 08/30/2014  . Impingement syndrome of left shoulder 08/30/2014  . Injury of superior glenoid labrum of shoulder joint 08/16/2014  . Supraspinatus tenosynovitis 08/16/2014  . Cervical spondylosis without myelopathy 08/16/2014  . Anxiety and depression 04/13/2014  . Osteoarthritis of hip (Left) 04/11/2014  . S/P THR (total hip replacement) (Left) 04/11/2014  . IBS (irritable bowel syndrome) 04/11/2014  . GERD (gastroesophageal reflux disease) 04/11/2014  . Insomnia 04/11/2014  . Essential hypertension, benign 10/28/2013  . Left hip pain 10/28/2013  . Anemia 10/28/2013  . HLD (hyperlipidemia) 10/28/2013    Past Surgical History:  Procedure Laterality Date  . ABDOMINAL HYSTERECTOMY  1985  . ANTERIOR CERVICAL DECOMP/DISCECTOMY FUSION  10/2012  . APPENDECTOMY  1985  . BACK SURGERY    . BREAST CYST EXCISION Right   . BREAST EXCISIONAL BIOPSY Right YRS AGO   NEG  . CARDIAC CATHETERIZATION   2000's; 2009  . CHOLECYSTECTOMY  2002  . EXCISION/RELEASE BURSA HIP Right 12/1984  . HERNIA REPAIR  1985  . LEFT HEART CATHETERIZATION WITH CORONARY ANGIOGRAM N/A 12/09/2014   Procedure: LEFT HEART CATHETERIZATION WITH CORONARY ANGIOGRAM;  Surgeon: Burnell Blanks, MD;  Location: [facility] CATH LAB;  Service: Cardiovascular;  Laterality: N/A;  . TONSILLECTOMY AND ADENOIDECTOMY  ~ 1968  . TOTAL HIP ARTHROPLASTY Left 04-06-2014  . UMBILICAL HERNIA REPAIR  1985  . VENA CAVA FILTER PLACEMENT  03/2014    Past Surgical History:  Procedure Laterality Date  . ABDOMINAL HYSTERECTOMY  1985  . ANTERIOR CERVICAL DECOMP/DISCECTOMY FUSION  10/2012  . APPENDECTOMY  1985  . BACK SURGERY    . BREAST CYST EXCISION Right   . BREAST EXCISIONAL BIOPSY Right YRS AGO   NEG  . CARDIAC CATHETERIZATION   2000's; 2009  .  CHOLECYSTECTOMY  2002  . EXCISION/RELEASE BURSA HIP Right 12/1984  . HERNIA REPAIR  1985  . LEFT HEART CATHETERIZATION WITH CORONARY ANGIOGRAM N/A 12/09/2014   Procedure: LEFT HEART CATHETERIZATION WITH CORONARY ANGIOGRAM;  Surgeon: Burnell Blanks, MD;  Location: [facility] CATH LAB;  Service: Cardiovascular;  Laterality: N/A;  . TONSILLECTOMY AND ADENOIDECTOMY  ~ 1968  . TOTAL HIP ARTHROPLASTY Left 04-06-2014  . UMBILICAL HERNIA REPAIR  1985  . VENA CAVA FILTER PLACEMENT  03/2014    Current Outpatient Rx  . Order #: [id number] Class: Normal  . Order #: [id number] Class: Historical Med  . Order #: [id number] Class: Normal  . Order #: [id number] Class: Normal  . Order #: [id number] Class: Normal  . Order #: [id number] Class: Print  . Order #: [id number] Class: Historical Med  . Order #: [id number] Class: Normal  . Order #: [id number] Class: Print  . Order #: [id number] Class: Normal  . Order #: [id number] Class: Normal  . Order #: [id number] Class: Normal  . Order #: [id number] Class: Normal  . Order #: [id number] Class: Normal  .  Order #: [id number] Class: Historical Med  . Order #: [id number] Class: Normal  . Order #: [id number] Class: Print  . Order #: [id number] Class: Normal  . Order #: [id number] Class: Print  . Order #: [id number] Class: Normal  . Order #: [id number] Class: Normal  . Order #: [id number] Class: Normal  . Order #: [id number] Class: Normal    Allergies:  Abilify [aripiprazole]; Augmentin [amoxicillin-pot clavulanate]; Bactrim [sulfamethoxazole-trimethoprim]; Ceftin [cefuroxime axetil]; Coconut oil; Diclofenac; Dye fdc red [red dye]; Indocin [indomethacin]; Linaclotide; Lisinopril; Morphine; Morphine and related; Simvastatin; Sulfa antibiotics; Sulfamethoxazole-trimethoprim; Wellbutrin [bupropion]; and Quetiapine fumarate  Family History: Family History  Problem Relation Age of Onset  . Stroke Mother     Deceased  . Lung cancer Father     Deceased  . Healthy Daughter   . Healthy Son     x 2  .  Other Son     Suicide  . Heart attack Neg Hx     Social History: Social History  Substance Use Topics  . Smoking status: Former Smoker    Packs/day: 1.50    Years: 10.00    Types: Cigarettes    Quit date: 04/12/1979  . Smokeless tobacco: Never Used  . Alcohol use No     Review of Systems:   10 point review of systems was performed and was otherwise negative:  Constitutional: No fever Eyes: No visual disturbances ENT: No sore throat, ear pain Cardiac: She states the chest pressure seems to "",come and go"" and doesn't really specify how long the waves of discomfort last. She describes some nausea with no vomiting. She denies any pleuritic or positional component to her discomfort. Respiratory: No shortness of breath, wheezing, or stridor Abdomen: No abdominal pain, no vomiting, No diarrhea Endocrine: No weight loss, No night sweats Extremities: No peripheral edema, cyanosis Skin: No rashes, easy bruising Neurologic: No focal weakness, trouble with speech or swollowing Urologic: No dysuria, Hematuria, or urinary frequency   Physical Exam:  ED Triage Vitals  Enc Vitals Group     BP 08/08/16 1731 (!) 165/95     Pulse Rate 08/08/16 1731 94     Resp --      Temp 08/08/16 1731 98.3 F (36.8 C)     Temp Source 08/08/16 1731 Oral     SpO2 08/08/16 1731 100 %     Weight 08/08/16 1735 195 lb 5 oz (88.6 kg)     Height 08/08/16 1731 5\' 6"  (1.676 m)     Head Circumference --      Peak Flow --      Pain Score 08/08/16 1736 4     Pain Loc --      Pain Edu? --      Excl. in [place]? --     General: Awake , Alert , and Oriented times 3; GCS 15 Head: Normal cephalic , atraumatic Eyes: Pupils equal , round, reactive to light Nose/Throat: No nasal drainage, patent upper airway without erythema or exudate.  Neck: Supple, Full range of motion, No anterior adenopathy or palpable thyroid masses Lungs: Clear to ascultation without wheezes , rhonchi, or rales Heart: Regular rate, regular  rhythm without murmurs , gallops , or rubs Abdomen: Soft, non tender without rebound, guarding , or rigidity; bowel sounds positive and symmetric in all 4 quadrants. No organomegaly .        Extremities: 2 plus symmetric pulses. No edema, clubbing or cyanosis. Patient has good palpable bilateral radial pulses. Neurologic: normal ambulation, Motor symmetric without deficits, sensory intact Skin: warm, dry, no  rashes   Labs:   All laboratory work was reviewed including any pertinent negatives or positives listed below:  [place]  TROPONIN I  Initial laboratory work shows no significant abnormalities  EKG:  ED ECG REPORT I, Daymon Larsen, the attending physician, personally viewed and interpreted this ECG.  Date: 08/08/2016 EKG Time: 1718 Rate: *95 Rhythm: normal sinus rhythm QRS Axis: normal Intervals: normal ST/T Wave abnormalities: normal Conduction Disturbances: none Narrative Interpretation: unremarkable No acute ischemic changes   Radiology:  "Dg Chest 2 View  Result Date: 08/08/2016 CLINICAL DATA:  Left-sided chest pressure starting this morning. EXAM: CHEST  2 VIEW COMPARISON:  03/26/2016 CXR FINDINGS: The patient is status post low anterior cervical disc fusion with hardware in place. The heart is top-normal in size. There is slight uncoiling of the thoracic aorta without aneurysm no pneumonic consolidation, effusion or pneumothorax. No pulmonary edema. Slight disc space narrowing of the upper to mid thoracic spine. No acute osseous abnormality. IVC filter and cholecystectomy clips are seen in the right upper quadrant. IMPRESSION: No active cardiopulmonary disease. Status post IVC filter, cholecystectomy and ACDF of the lower cervical spine. Electronically Signed   By: Ashley Royalty M.D.   On: 08/08/2016 17:56   Ct Angio Chest Pe W Or Wo Contrast  Result Date: 08/08/2016 CLINICAL DATA:  Left-sided chest pain, onset today. EXAM: CT  ANGIOGRAPHY CHEST WITH CONTRAST TECHNIQUE: Multidetector CT imaging of the chest was performed using the standard protocol during bolus administration of intravenous contrast. Multiplanar CT image reconstructions and MIPs were obtained to evaluate the vascular anatomy. CONTRAST:  75 cc Isovue 370 IV COMPARISON:  Radiographs earlier this day.  Chest CT 09/19/2014 FINDINGS: Cardiovascular: The previous small bilateral subsegmental filling defects in the pulmonary arteries have resolved. No new or progressive filling defects to suggest acute pulmonary embolus. No thoracic aortic dissection or aneurysm. Normal heart size. No pericardial fluid. Mediastinum/Nodes: No enlarged mediastinal, hilar, or axillary lymph nodes. Thyroid gland, trachea, and esophagus demonstrate no significant findings. Lungs/Pleura: Mild dependent atelectasis within both lung bases. No confluent airspace disease. No pulmonary edema, suspicious nodule or pulmonary mass. Pleural fluid. Upper Abdomen: No acute abnormality.  Postcholecystectomy. Musculoskeletal: There are no acute or suspicious osseous abnormalities. Postsurgical change in the lower cervical spine is partially included. Review of the MIP images confirms the above findings. IMPRESSION: 1. No acute pulmonary embolus. Previous small pulmonary emboli have resolved. 2. No acute abnormality. Electronically Signed   By: Jeb Levering M.D.   On: 08/08/2016 19:38  "  I personally reviewed the radiologic studies     ED Course: * Patient had serial troponins which were negative and also had a chest CT to evaluate for her history of pulmonary embolism. She does not appear to have a life-threatening cause for her chest discomfort. She describes some vasospasm-type activity in her left hand, possible Ray not she has good pulses and I felt was unlikely to be an acute embolic phenomenon. She was advised her workup is not complete and return here if she develops a fever, productive cough,  shortness of breath or any other new concerns. Clinical Course      Assessment:  Acute atypical chest pain   Final Clinical Impression:   Final diagnoses:  Chest pain     Plan:  Outpatient Patient was advised to return immediately if condition worsens. Patient was advised to follow up with their primary care physician or other specialized physicians involved in  their outpatient care. The patient and/or family member/power of attorney had laboratory results reviewed at the bedside. All questions and concerns were addressed and appropriate discharge instructions were distributed by the nursing staff.             Daymon Larsen, MD 08/08/16 [phone]

## 2016-08-08 NOTE — ED Triage Notes (Signed)
Pt with c/o left chest pressure that started this am. Pt says she has a heart history from 2012 but is vague about what it is. Pt also c/o non-productive cough since yesterday and left hand turning blue starting today. Pt is homeless with no support and has not eaten since yesterday.

## 2016-08-15 ENCOUNTER — Emergency Department: Payer: Medicare Other

## 2016-08-15 ENCOUNTER — Encounter: Payer: Self-pay | Admitting: Emergency Medicine

## 2016-08-15 ENCOUNTER — Emergency Department
Admission: EM | Admit: 2016-08-15 | Discharge: 2016-08-15 | Disposition: A | Discharge status: Home / Self Care | Payer: Medicare Other | Attending: Emergency Medicine | Admitting: Emergency Medicine

## 2016-08-15 DIAGNOSIS — Z87891 Personal history of nicotine dependence: Secondary | ICD-10-CM | POA: Diagnosis not present

## 2016-08-15 DIAGNOSIS — E119 Type 2 diabetes mellitus without complications: Secondary | ICD-10-CM | POA: Insufficient documentation

## 2016-08-15 DIAGNOSIS — I10 Essential (primary) hypertension: Secondary | ICD-10-CM | POA: Insufficient documentation

## 2016-08-15 DIAGNOSIS — Z79899 Other long term (current) drug therapy: Secondary | ICD-10-CM | POA: Insufficient documentation

## 2016-08-15 DIAGNOSIS — R05 Cough: Secondary | ICD-10-CM | POA: Insufficient documentation

## 2016-08-15 DIAGNOSIS — R0602 Shortness of breath: Secondary | ICD-10-CM | POA: Diagnosis not present

## 2016-08-15 DIAGNOSIS — R079 Chest pain, unspecified: Secondary | ICD-10-CM | POA: Diagnosis not present

## 2016-08-15 DIAGNOSIS — G894 Chronic pain syndrome: Secondary | ICD-10-CM

## 2016-08-15 DIAGNOSIS — R059 Cough, unspecified: Secondary | ICD-10-CM

## 2016-08-15 LAB — DIFFERENTIAL
Basophils Absolute: 0.1 10*3/uL (ref 0–0.1)
Basophils Relative: 1 %
Eosinophils Absolute: 0.4 10*3/uL (ref 0–0.7)
Eosinophils Relative: 6 %
Lymphocytes Relative: 27 %
Lymphs Abs: 1.8 10*3/uL (ref 1.0–3.6)
Monocytes Absolute: 0.4 10*3/uL (ref 0.2–0.9)
Monocytes Relative: 7 %
Neutro Abs: 3.9 10*3/uL (ref 1.4–6.5)
Neutrophils Relative %: 59 %

## 2016-08-15 LAB — URINALYSIS, COMPLETE (UACMP) WITH MICROSCOPIC
Bacteria, UA: NONE SEEN
Bilirubin Urine: NEGATIVE
Glucose, UA: NEGATIVE mg/dL
Hgb urine dipstick: NEGATIVE
Ketones, ur: NEGATIVE mg/dL
Leukocytes, UA: NEGATIVE
Nitrite: NEGATIVE
Protein, ur: NEGATIVE mg/dL
RBC / HPF: NONE SEEN RBC/hpf (ref 0–5)
Specific Gravity, Urine: 1.032 — ABNORMAL HIGH (ref 1.005–1.030)
Squamous Epithelial / LPF: NONE SEEN
pH: 7 (ref 5.0–8.0)

## 2016-08-15 LAB — FIBRIN DERIVATIVES D-DIMER (ARMC ONLY): Fibrin derivatives D-dimer (ARMC): 748 — ABNORMAL HIGH (ref 0–499)

## 2016-08-15 LAB — CBC
HCT: 32.5 % — ABNORMAL LOW (ref 35.0–47.0)
Hemoglobin: 11.1 g/dL — ABNORMAL LOW (ref 12.0–16.0)
MCH: 31 pg (ref 26.0–34.0)
MCHC: 34 g/dL (ref 32.0–36.0)
MCV: 91.2 fL (ref 80.0–100.0)
Platelets: 355 10*3/uL (ref 150–440)
RBC: 3.56 MIL/uL — ABNORMAL LOW (ref 3.80–5.20)
RDW: 14.2 % (ref 11.5–14.5)
WBC: 6.6 10*3/uL (ref 3.6–11.0)

## 2016-08-15 LAB — BASIC METABOLIC PANEL
Anion gap: 5 (ref 5–15)
BUN: 11 mg/dL (ref 6–20)
CO2: 25 mmol/L (ref 22–32)
Calcium: 9 mg/dL (ref 8.9–10.3)
Chloride: 108 mmol/L (ref 101–111)
Creatinine, Ser: 0.79 mg/dL (ref 0.44–1.00)
GFR calc Af Amer: >60 mL/min (ref >60)
GFR calc non Af Amer: >60 mL/min (ref >60)
Glucose, Bld: 108 mg/dL — ABNORMAL HIGH (ref 65–99)
Potassium: 3.7 mmol/L (ref 3.5–5.1)
Sodium: 138 mmol/L (ref 135–145)

## 2016-08-15 LAB — HEPATIC FUNCTION PANEL
ALT: 25 U/L (ref 14–54)
AST: 29 U/L (ref 15–41)
Albumin: 3.9 g/dL (ref 3.5–5.0)
Alkaline Phosphatase: 99 U/L (ref 38–126)
Bilirubin, Direct: <0.1 mg/dL — ABNORMAL LOW (ref 0.1–0.5)
Total Bilirubin: 0.4 mg/dL (ref 0.3–1.2)
Total Protein: 7.5 g/dL (ref 6.5–8.1)

## 2016-08-15 LAB — BRAIN NATRIURETIC PEPTIDE: B Natriuretic Peptide: 143 pg/mL — ABNORMAL HIGH (ref 0.0–100.0)

## 2016-08-15 LAB — TROPONIN I: Troponin I: <0.03 ng/mL (ref <0.03)

## 2016-08-15 MED ORDER — ACETAMINOPHEN 325 MG PO TABS
650.0000 mg | ORAL_TABLET | Freq: Once | ORAL | Status: AC
  Administered 2016-08-15: 650 mg via ORAL

## 2016-08-15 MED ORDER — IOPAMIDOL (ISOVUE-370) INJECTION 76%
75.0000 mL | Freq: Once | INTRAVENOUS | Status: AC | PRN
  Administered 2016-08-15: 75 mL via INTRAVENOUS

## 2016-08-15 MED ORDER — ESCITALOPRAM OXALATE 10 MG PO TABS: 10.0000 mg | ORAL_TABLET | Freq: Every day | ORAL | 0 refills | Status: DC

## 2016-08-15 MED ORDER — IPRATROPIUM-ALBUTEROL 0.5-2.5 (3) MG/3ML IN SOLN
3.0000 mL | Freq: Once | RESPIRATORY_TRACT | Status: AC
  Administered 2016-08-15: 3 mL via RESPIRATORY_TRACT

## 2016-08-15 MED ORDER — ACETAMINOPHEN 325 MG PO TABS
ORAL_TABLET | ORAL | Status: AC
  Administered 2016-08-15: 650 mg via ORAL
  Filled 2016-08-15: qty 2

## 2016-08-15 MED ORDER — APIXABAN 5 MG PO TABS: 5.0000 mg | ORAL_TABLET | Freq: Two times a day (BID) | ORAL | 0 refills | Status: DC

## 2016-08-15 MED ORDER — ALBUTEROL SULFATE HFA 108 (90 BASE) MCG/ACT IN AERS: 2.0000 | INHALATION_SPRAY | Freq: Four times a day (QID) | RESPIRATORY_TRACT | 2 refills | Status: DC | PRN

## 2016-08-15 MED ORDER — CARVEDILOL 3.125 MG PO TABS: 3.1250 mg | ORAL_TABLET | Freq: Two times a day (BID) | ORAL | 0 refills | Status: DC

## 2016-08-15 MED ORDER — ISOSORBIDE MONONITRATE ER 30 MG PO TB24: 30.0000 mg | ORAL_TABLET | Freq: Every day | ORAL | 0 refills | Status: DC

## 2016-08-15 MED ORDER — IPRATROPIUM-ALBUTEROL 0.5-2.5 (3) MG/3ML IN SOLN
3.0000 mL | Freq: Once | RESPIRATORY_TRACT | Status: AC
  Administered 2016-08-15: 3 mL via RESPIRATORY_TRACT
  Filled 2016-08-15: qty 3

## 2016-08-15 MED ORDER — GABAPENTIN 600 MG PO TABS: 300.0000 mg | ORAL_TABLET | Freq: Three times a day (TID) | ORAL | 0 refills | Status: DC

## 2016-08-15 MED ORDER — IPRATROPIUM-ALBUTEROL 0.5-2.5 (3) MG/3ML IN SOLN
RESPIRATORY_TRACT | Status: AC
  Administered 2016-08-15: 3 mL via RESPIRATORY_TRACT
  Filled 2016-08-15: qty 3

## 2016-08-15 NOTE — ED Provider Notes (Signed)
[facility] Emergency Department Provider Note   ____________________________________________   First MD Initiated Contact with Patient 08/15/16 1127     (approximate)  I have reviewed the triage vital signs and the nursing notes.   HISTORY  Chief Complaint Cough and Chest Pain    HPI Valerie Vazquez is a 60 y.o. female patient reports a dry cough for several days and no fever. Patient ran out of her blood thinners and has been taking aspirin instead. Patient complains of feeling short of breath and having some wheezing. She says she has sharp pains intermittently in the right side of the chest site of the chest and under the ribs on both sides. She thinks that this pain is not like her previous pulmonary embolus pain. Review of the records show the cause been going on for at least a week.   Past Medical History:  Diagnosis Date  . Anemia   . Bilateral renal cysts   . Chronic back pain    "upper and lower" (09/19/2014)  . Depression    "major" (09/19/2014)  . DVT (deep venous thrombosis) ([place])   . Esophageal spasm   . Gallstones   . Gastritis   . Gastroenteritis   . GERD (gastroesophageal reflux disease)   . Headache    "couple /month lately" (09/19/2014)  . High blood pressure   . High cholesterol   . History of blood transfusion 1985   related to hysterectomy  . Hypertension   . IBS (irritable bowel syndrome)   . Migraine    "a few times/yr" (09/19/2014)  . Myocardial infarction 10/2010 X 3   "while hospitalized"  . Osteoarthritis    "qwhere; mostly around my joints" (09/19/2014)  . Pneumonia 04/2014  . PTSD (post-traumatic stress disorder)   . Pulmonary embolism ([place]) 04/2001; 09/19/2014   "after gallbladder OR; "  . Schizoaffective disorder ([place])   . Sleep apnea    "mild" (09/19/2014)  . Snoring    a. sleep study 5/16: No OSA  . Spinal stenosis   . Spondylosis   . Type II diabetes mellitus ([place])    "dx'd in 2007; lost weight; no  RX for ~ 4 yr now" (09/19/2014)    Patient Active Problem List   Diagnosis Date Noted  . Rash and nonspecific skin eruption 04/16/2016  . Colitis 04/08/2016  . Tremor 03/19/2016  . Left shoulder pain 02/18/2016  . Abdominal distension 02/18/2016  . Absolute anemia 01/29/2016  . Chronic pain 01/29/2016  . Neurogenic pain 01/29/2016  . Musculoskeletal pain 01/29/2016  . Long term current use of opiate analgesic 01/29/2016  . Long term prescription opiate use 01/29/2016  . Opiate use (15 MME/Day) 01/29/2016  . Encounter for therapeutic drug level monitoring 01/29/2016  . Encounter for pain management planning 01/29/2016  . Chronic neck pain (midline) 01/29/2016  . Chronic chest wall pain 01/29/2016  . Chronic low back pain (Location of Secondary source of pain) (Bilateral) (R>L) 01/29/2016  . Disturbance of skin sensation 01/29/2016  . Chronic shoulder pain (Location of Primary Source of Pain) (Left) 01/29/2016  . Chronic hip pain (Location of Tertiary source of pain) (Bilateral) (R>L) 01/29/2016  . Osteoarthritis of hip (Bilateral) 01/29/2016  . Lumbar facet arthropathy (Severe at L2-3, L3-4, L4-5, and L5-S1) 01/29/2016  . Lumbar central spinal stenosis (moderate at L4-5; mild at L2-3 and L3-4) 01/29/2016  . Lumbar foraminal stenosis (L4-5) (Right) 01/29/2016  . Lumbar facet syndrome 01/29/2016  . Hx of cervical spine surgery  01/29/2016  . Numbness 11/19/2015  . Right wrist pain 11/09/2015  . Breast pain 10/19/2015  . Chest pain 10/14/2015  . Headache 10/14/2015  . Abdominal pain 10/14/2015  . Lip swelling 10/14/2015  . Pulmonary embolism ([place]) 08/15/2015  . Bilateral pneumonia 08/15/2015  . Nausea 08/15/2015  . Shortness of breath 07/31/2015  . Pleural effusion, bilateral 07/31/2015  . Cough 07/31/2015  . Chronic pain of multiple sites 06/05/2015  . Chronic lumbosacral pain 06/05/2015  . Esophageal spasm 06/05/2015  . History of DVT (deep vein thrombosis) 02/25/2015  .  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 Constipation 12/12/2014  . Coronary vasospasm ([place]) 12/09/2014  . History of pulmonary embolus (PE) 09/19/2014  . Impingement syndrome of shoulder 08/30/2014  . Impingement syndrome of left shoulder 08/30/2014  . Injury of superior glenoid labrum of shoulder joint 08/16/2014  . Supraspinatus tenosynovitis 08/16/2014  . Cervical spondylosis without myelopathy 08/16/2014  . Anxiety and depression 04/13/2014  . Osteoarthritis of hip (Left) 04/11/2014  . S/P THR (total hip replacement) (Left) 04/11/2014  . IBS (irritable bowel syndrome) 04/11/2014  . GERD (gastroesophageal reflux disease) 04/11/2014  . Insomnia 04/11/2014  . Essential hypertension, benign 10/28/2013  . Left hip pain 10/28/2013  . Anemia 10/28/2013  . HLD (hyperlipidemia) 10/28/2013    Past Surgical History:  Procedure Laterality Date  . ABDOMINAL HYSTERECTOMY  1985  . ANTERIOR CERVICAL DECOMP/DISCECTOMY FUSION  10/2012  . APPENDECTOMY  1985  . BACK SURGERY    . BREAST CYST EXCISION Right   . BREAST EXCISIONAL BIOPSY Right YRS AGO   NEG  . CARDIAC CATHETERIZATION   2000's; 2009  . CHOLECYSTECTOMY  2002  . EXCISION/RELEASE BURSA HIP Right 12/1984  . HERNIA REPAIR  1985  . LEFT HEART CATHETERIZATION WITH CORONARY ANGIOGRAM N/A 12/09/2014   Procedure: LEFT HEART CATHETERIZATION WITH CORONARY ANGIOGRAM;  Surgeon: Burnell Blanks, MD;  Location: [facility] CATH LAB;  Service: Cardiovascular;  Laterality: N/A;  . TONSILLECTOMY AND ADENOIDECTOMY  ~ 1968  . TOTAL HIP ARTHROPLASTY Left 04-06-2014  . UMBILICAL HERNIA REPAIR  1985  . VENA CAVA FILTER PLACEMENT  03/2014    Prior to Admission medications   Medication Sig Start Date End Date Taking? Authorizing Provider  apixaban (ELIQUIS) 5 MG TABS tablet Take 1 tablet (5 mg total) by mouth 2 (two) times daily. 08/15/16   Nena Polio, MD  calcium carbonate (TUMS - DOSED IN MG ELEMENTAL CALCIUM) 500 MG chewable tablet Chew 1 tablet by mouth daily  as needed for indigestion or heartburn.    Historical Provider, MD  carvedilol (COREG) 3.125 MG tablet Take 1 tablet (3.125 mg total) by mouth 2 (two) times daily with a meal. 08/15/16   Nena Polio, MD  cyclobenzaprine (FLEXERIL) 10 MG tablet Take 1 tablet (10 mg total) by mouth 3 (three) times daily as needed for muscle spasms. 06/02/16   [name], MD  dicyclomine (BENTYL) 10 MG capsule TAKE 2 CAPSULES BY MOUTH EVERY SIX HOURS Patient taking differently: TAKE 1 CAPSULES BY MOUTH THREE TIMES DAILY 04/09/15   [name], MD  dicyclomine (BENTYL) 20 MG tablet Take 1 tablet (20 mg total) by mouth 3 (three) times daily as needed for spasms. 04/11/16 04/11/17  Earleen Newport, MD  diltiazem (CARDIZEM CD) 120 MG 24 hr capsule Take 120 mg by mouth daily. 12/26/15   Historical Provider, MD  diltiazem (CARDIZEM SR) 60 MG 12 hr capsule TAKE 1 CAPSULE (60 MG TOTAL) BY MOUTH EVERY 6 (SIX) HOURS AS NEEDED.  06/30/16   Manus Gunning, MD  diphenhydrAMINE (BENADRYL) 25 MG tablet Take 1-2 tablets (25-50 mg total) by mouth every 6 (six) hours as needed for itching (Rash). 07/14/15   Antonietta Breach, PA-C  EPINEPHrine 0.3 mg/0.3 mL IJ SOAJ injection Inject 0.3 mLs (0.3 mg total) into the muscle once. 10/12/15   [name], MD  escitalopram (LEXAPRO) 10 MG tablet Take 1 tablet (10 mg total) by mouth daily. 08/15/16   Nena Polio, MD  furosemide (LASIX) 20 MG tablet TAKE 1 TABLET BY MOUTH EVERY DAY Patient taking differently: TAKE 1 TABLET BY MOUTH EVERY DAY prn 08/14/15   [name], MD  gabapentin (NEURONTIN) 600 MG tablet Take 0.5 tablets (300 mg total) by mouth every 8 (eight) hours. 08/15/16   Nena Polio, MD  isosorbide mononitrate (IMDUR) 30 MG 24 hr tablet Take 1 tablet (30 mg total) by mouth daily. 08/15/16   Nena Polio, MD  Multiple Vitamin (MULTIVITAMIN WITH MINERALS) TABS tablet Take 2 tablets by mouth daily.     Historical Provider, MD  mupirocin ointment (BACTROBAN)  2 % Place 1 application into the nose 3 (three) times daily. 04/16/16   [name], MD  nitrofurantoin, macrocrystal-monohydrate, (MACROBID) 100 MG capsule Take 1 capsule (100 mg total) by mouth 2 (two) times daily. 04/11/16   Earleen Newport, MD  ondansetron (ZOFRAN) 4 MG tablet Take 1 tablet (4 mg total) by mouth every 8 (eight) hours as needed for nausea or vomiting. 08/15/15   [name], MD  oxyCODONE-acetaminophen (PERCOCET/ROXICET) 5-325 MG tablet Take 1 tablet by mouth 2 (two) times daily as needed for severe pain. 02/18/16   [name], MD  pantoprazole (PROTONIX) 40 MG tablet TAKE ONE TABLET BY MOUTH EVERY DAY 09/03/15   [name], MD  potassium chloride (K-DUR) 10 MEQ tablet Take 1 tablet (10 mEq total) by mouth 2 (two) times daily. Patient taking differently: Take 10 mEq by mouth 2 (two) times daily as needed.  10/22/15   Burnell Blanks, MD  traZODone (DESYREL) 50 MG tablet Take 1 tablet (50 mg total) by mouth at bedtime. Patient taking differently: Take 50 mg by mouth at bedtime as needed for sleep.  04/24/15   [name], MD  triamcinolone (NASACORT AQ) 55 MCG/ACT AERO nasal inhaler Place 2 sprays into the nose daily. 10/02/15   Wilhelmina Mcardle, MD    Allergies Abilify [aripiprazole]; Augmentin [amoxicillin-pot clavulanate]; Bactrim [sulfamethoxazole-trimethoprim]; Ceftin [cefuroxime axetil]; Coconut oil; Diclofenac; Dye fdc red [red dye]; Indocin [indomethacin]; Linaclotide; Lisinopril; Morphine; Morphine and related; Simvastatin; Sulfa antibiotics; Sulfamethoxazole-trimethoprim; Wellbutrin [bupropion]; and Quetiapine fumarate  Family History  Problem Relation Age of Onset  . Stroke Mother     Deceased  . Lung cancer Father     Deceased  . Healthy Daughter   . Healthy Son     x 2  . Other Son     Suicide  . Heart attack Neg Hx     Social History Social History  Substance Use Topics  . Smoking status: Former Smoker    Packs/day: 1.50     Years: 10.00    Types: Cigarettes    Quit date: 04/12/1979  . Smokeless tobacco: Never Used  . Alcohol use No    Review of Systems Constitutional: No fever/chills Eyes: No visual changes. ENT: No sore throat. Cardiovascular:chest pain. Respiratory: shortness of breath. Gastrointestinal: No abdominal pain.  No nausea, no vomiting.  No diarrhea.  No constipation. Genitourinary: Negative for  dysuria. Musculoskeletal: Negative for back pain. Skin: Negative for rash. Neurological: Negative for headaches, focal weakness or numbness.  10-point ROS otherwise negative.  ____________________________________________   PHYSICAL EXAM:  VITAL SIGNS: ED Triage Vitals  Enc Vitals Group     BP 08/15/16 0929 (!) 148/73     Pulse Rate 08/15/16 0929 85     Resp 08/15/16 0929 18     Temp 08/15/16 0929 98.5 F (36.9 C)     Temp Source 08/15/16 0929 Oral     SpO2 08/15/16 0929 99 %     Weight 08/15/16 0930 195 lb (88.5 kg)     Height 08/15/16 0930 5\' 6"  (1.676 m)     Head Circumference --      Peak Flow --      Pain Score 08/15/16 0930 5     Pain Loc --      Pain Edu? --      Excl. in [place]? --     Constitutional: Alert and oriented. Well appearing and in no acute distress. Eyes: Conjunctivae are normal. PERRL. EOMI. Head: Atraumatic. Nose: No congestion/rhinnorhea. Mouth/Throat: Mucous membranes are moist.  Oropharynx non-erythematous. Neck: No stridor.   Cardiovascular: Normal rate, regular rhythm. Grossly normal heart sounds.  Good peripheral circulation. Respiratory: Normal respiratory effort.  No retractions. Lungs CTAB. Gastrointestinal: Soft and nontender. No distention. No abdominal bruits. No CVA tenderness. Musculoskeletal: No lower extremity tenderness nor edema.  No joint effusions. Neurologic:  Normal speech and language. No gross focal neurologic deficits are appreciated. No gait instability. Skin:  Skin is warm, dry and intact. No rash  noted.   ____________________________________________   LABS (all labs ordered are listed, but only abnormal results are displayed)  Labs Reviewed  BASIC METABOLIC PANEL - Abnormal; Notable for the following:       Result Value   Glucose, Bld 108 (*)    All other components within normal limits  CBC - Abnormal; Notable for the following:    RBC 3.56 (*)    Hemoglobin 11.1 (*)    HCT 32.5 (*)    All other components within normal limits  BRAIN NATRIURETIC PEPTIDE - Abnormal; Notable for the following:    B Natriuretic Peptide 143.0 (*)    All other components within normal limits  FIBRIN DERIVATIVES D-DIMER ([facility] ONLY) - Abnormal; Notable for the following:    Fibrin derivatives D-dimer ([facility]) 748 (*)    All other components within normal limits  HEPATIC FUNCTION PANEL - Abnormal; Notable for the following:    Bilirubin, Direct <0.1 (*)    All other components within normal limits  TROPONIN I  DIFFERENTIAL  CBC WITH DIFFERENTIAL/PLATELET  URINALYSIS, COMPLETE (UACMP) WITH MICROSCOPIC   ____________________________________________  EKG  EKG read and interpreted by me shows normal sinus rhythm rate of 78 normal axis no acute ST-T wave changes ____________________________________________  RADIOLOGY Study Result   CLINICAL DATA:  Cough and mid and lateral chest pain. History of pneumonia, pulmonary embolism, previous MI. Remote history of smoking.  EXAM: CHEST  2 VIEW  COMPARISON:  Chest x-ray and chest CT scan of August 08, 2014  FINDINGS: The lungs are mildly hyperinflated. There is no focal infiltrate. There is no pleural effusion. The heart and pulmonary vascularity are normal. The mediastinum is normal in width. The bony thorax exhibits no acute abnormality. The patient has undergone previous lower anterior cervical fusion.  IMPRESSION: Mild hyperinflation consistent with reactive airway disease or COPD. No pneumonia nor other acute cardiopulmonary  abnormality.   Electronically Signed   By: David  Martinique M.D.   On: 08/15/2016 10:12    Study Result   CLINICAL DATA:  Cough for 3-4 days with left-sided chest pain, initial encounter  EXAM: CT ANGIOGRAPHY CHEST WITH CONTRAST  TECHNIQUE: Multidetector CT imaging of the chest was performed using the standard protocol during bolus administration of intravenous contrast. Multiplanar CT image reconstructions and MIPs were obtained to evaluate the vascular anatomy.  CONTRAST:  75 mL Isovue 370.  COMPARISON:  None.  FINDINGS: Cardiovascular: Thoracic aorta demonstrates a normal branching pattern. No significant atherosclerotic changes are seen. No significant coronary calcifications are noted. Cardiac structures are otherwise within normal limits. Pulmonary artery demonstrates a normal branching pattern without evidence of pulmonary embolism.  Mediastinum/Nodes: The thoracic inlet is within normal limits. No significant hilar or mediastinal adenopathy is identified. No axillary adenopathy is seen.  Lungs/Pleura: Lungs are clear. No pleural effusion or pneumothorax.  Upper Abdomen: Gallbladder has been surgically removed. The remainder of the upper abdomen is within normal limits.  Musculoskeletal: No acute abnormality noted.  Review of the MIP images confirms the above findings.  IMPRESSION: No acute abnormality noted.   Electronically Signed   By: Inez Catalina M.D.   On: 08/15/2016 13:34     ____________________________________________   PROCEDURES  Procedure(s) performed:   Procedures  Critical Care performed:  ____________________________________________   INITIAL IMPRESSION / ASSESSMENT AND PLAN / ED COURSE  Pertinent labs & imaging results that were available during my care of the patient were reviewed by me and considered in my medical decision making (see chart for details).    Clinical Course       ____________________________________________   FINAL CLINICAL IMPRESSION(S) / ED DIAGNOSES  Final diagnoses:  Cough      NEW MEDICATIONS STARTED DURING THIS VISIT:  Current Discharge Medication List       Note:  This document was prepared using Dragon voice recognition software and may include unintentional dictation errors.    Nena Polio, MD 08/15/16 [phone]

## 2016-08-15 NOTE — ED Triage Notes (Signed)
Pt has had cough for past 3-4 days. Voice hoarse. Dry cough. Pt afraid she has PNA. Also c/o chest pain that she reports is not from the cough. Chest pain is under left breast; feels like 2 different things going on. No distress in triage.

## 2016-08-15 NOTE — Care Management Note (Signed)
Case Management Note  Patient Details  Name: [name] MRN: [id number] Date of Birth: 1956/03/01  Subjective/Objective:     Saw patient per Dr Cinda Quest regarding shelter assistance as well as help with home meds. Patient initially has asked for referral/ info for any other place but [facility]. I returned and explained that after talking to [facility] the only other option would be [facility] [place], now the patient says that [facility] would not house her because she refused Hospice services when recommended months ago by Dr Lenis Dickinson.     She states when given info on [facility] care that she is well below their poverty level. She is still convinced that [employer] is barring her because she has not availed herself of Hospice. I have contacted CSW Claudine and asked her to come see the patient.           Action/Plan:   Expected Discharge Date:                  Expected Discharge Plan:     In-House Referral:     Discharge planning Services     Post Acute Care Choice:    Choice offered to:     DME Arranged:    DME Agency:     HH Arranged:    HH Agency:     Status of Service:     If discussed at [employer] of Stay Meetings, dates discussed:    Additional Comments:  Beau Fanny, RN 08/15/2016, 2:30 PM

## 2016-08-15 NOTE — ED Notes (Signed)
Pt states she did not take her AM blood pressure meds today.

## 2016-08-15 NOTE — Care Management Note (Signed)
Case Management Note  Patient Details  Name: Daiona Tomey MRN: [id number] Date of Birth: 03/28/1956  Subjective/Objective:       Verified with [facility] that the patient has not been seen there, and that they are there until 4:30 today. I also verified that they can help the patient even though she says now she has Medicare part D in place.   Dr Cinda Quest and the RN made aware.           Action/Plan:   Expected Discharge Date:                  Expected Discharge Plan:     In-House Referral:     Discharge planning Services     Post Acute Care Choice:    Choice offered to:     DME Arranged:    DME Agency:     HH Arranged:    HH Agency:     Status of Service:     If discussed at [employer] of Stay Meetings, dates discussed:    Additional Comments:  Beau Fanny, RN 08/15/2016, 2:46 PM

## 2016-08-15 NOTE — Discharge Instructions (Signed)
Use the albuterol inhaler 4 times a day as needed. It hopefully will help you with the cough. Please return if you're worse. If you're voice isn't back to normal in another week please return and we'll send you to the ear nose and throat doctor if you're hoarse for 2 weeks U should be checked.

## 2016-08-19 ENCOUNTER — Ambulatory Visit: Payer: Self-pay | Admitting: Family Medicine

## 2016-08-19 IMAGING — CR DG CHEST 2V
1 series · 2 of 2 positions shown · non-contrast
Comparison: 09/19/2014 ; 08/01/2014; chest CT - 09/19/2014

CLINICAL DATA: Pulmonary edema. Released from hospital with clots
in lungs and fluid. Former smoker.

EXAM:
CHEST  2 VIEW

[Series 1: kdxr chest pa (or ap) and lat · 0.14mm/px · 2 of 2 slices shown]
[im 1/2]
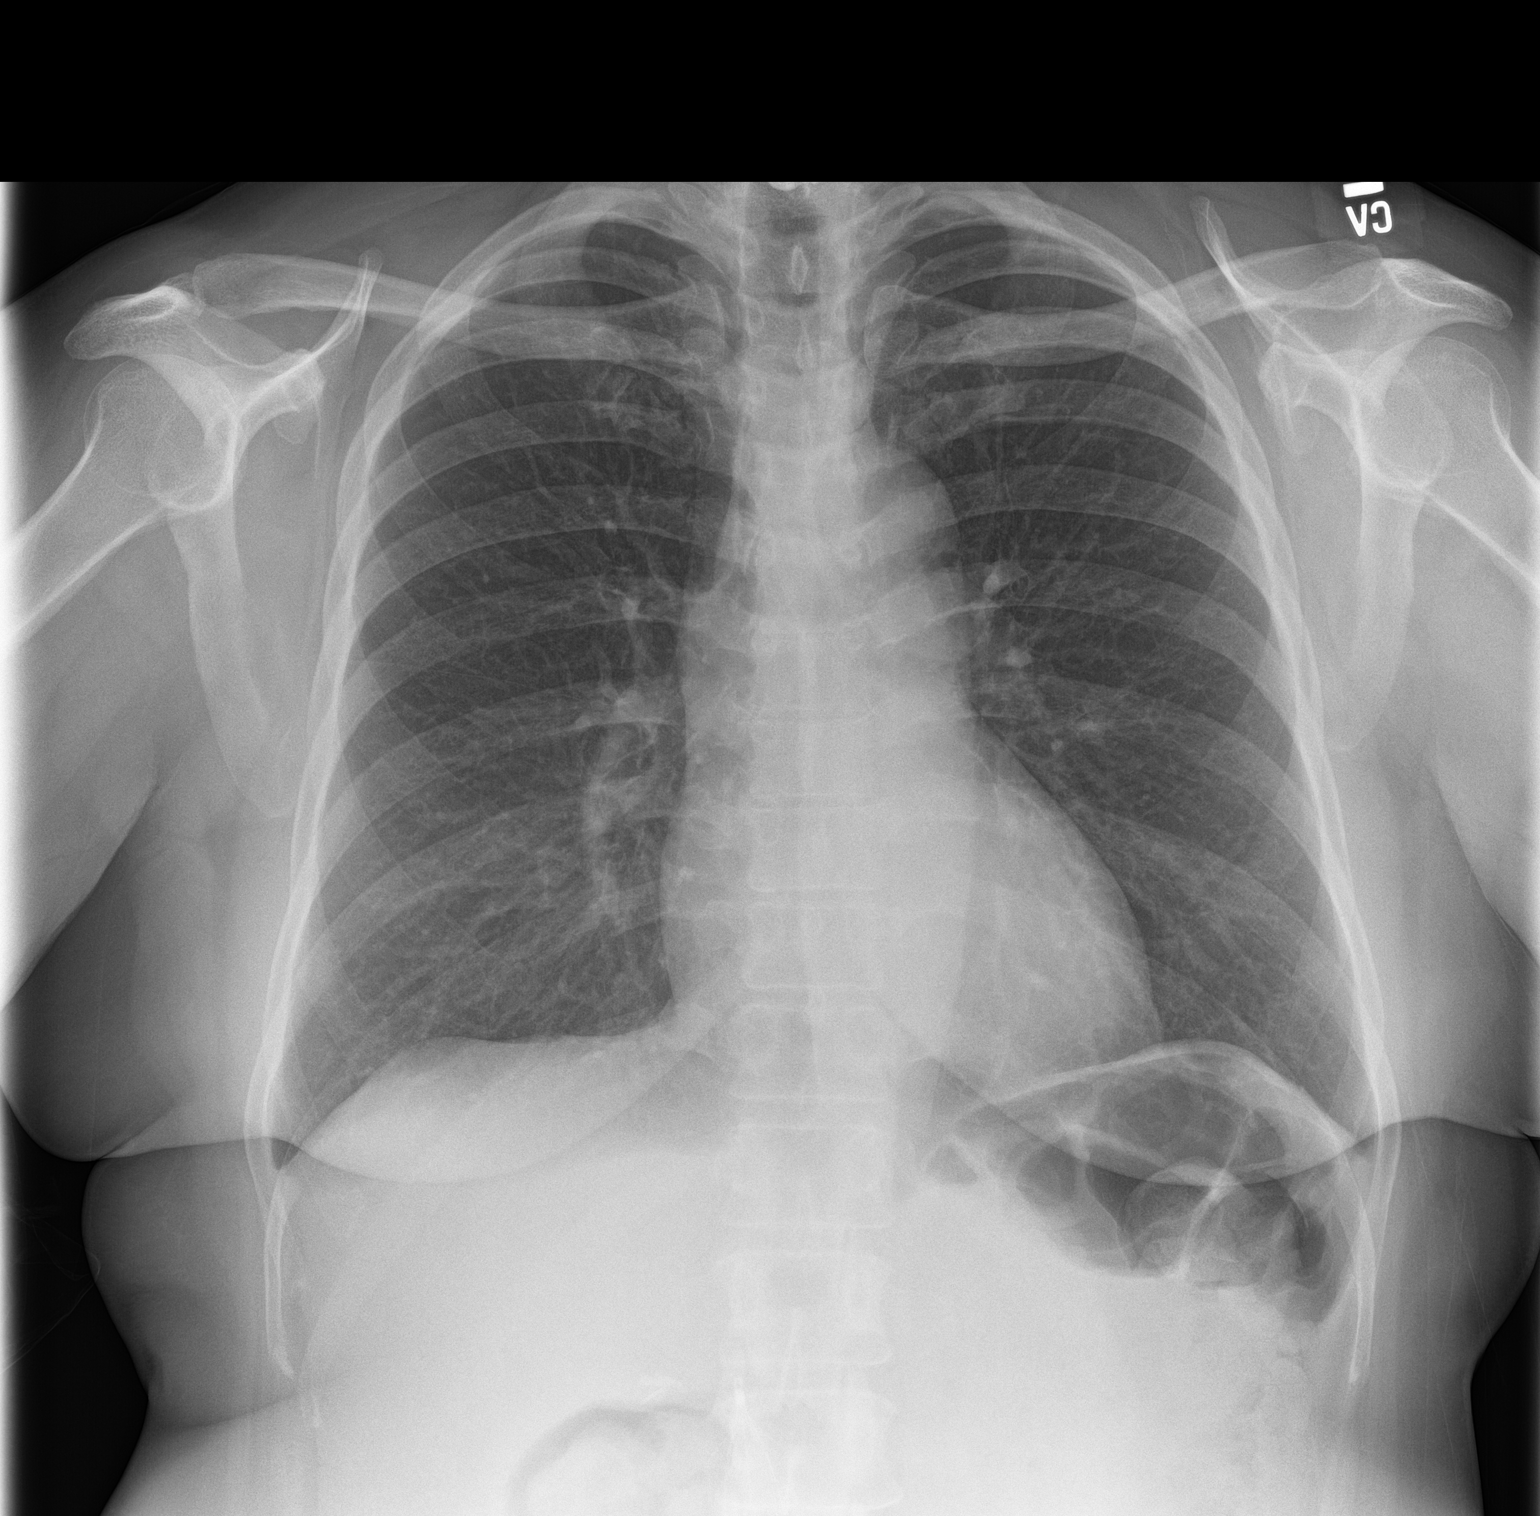
[im 2/2]
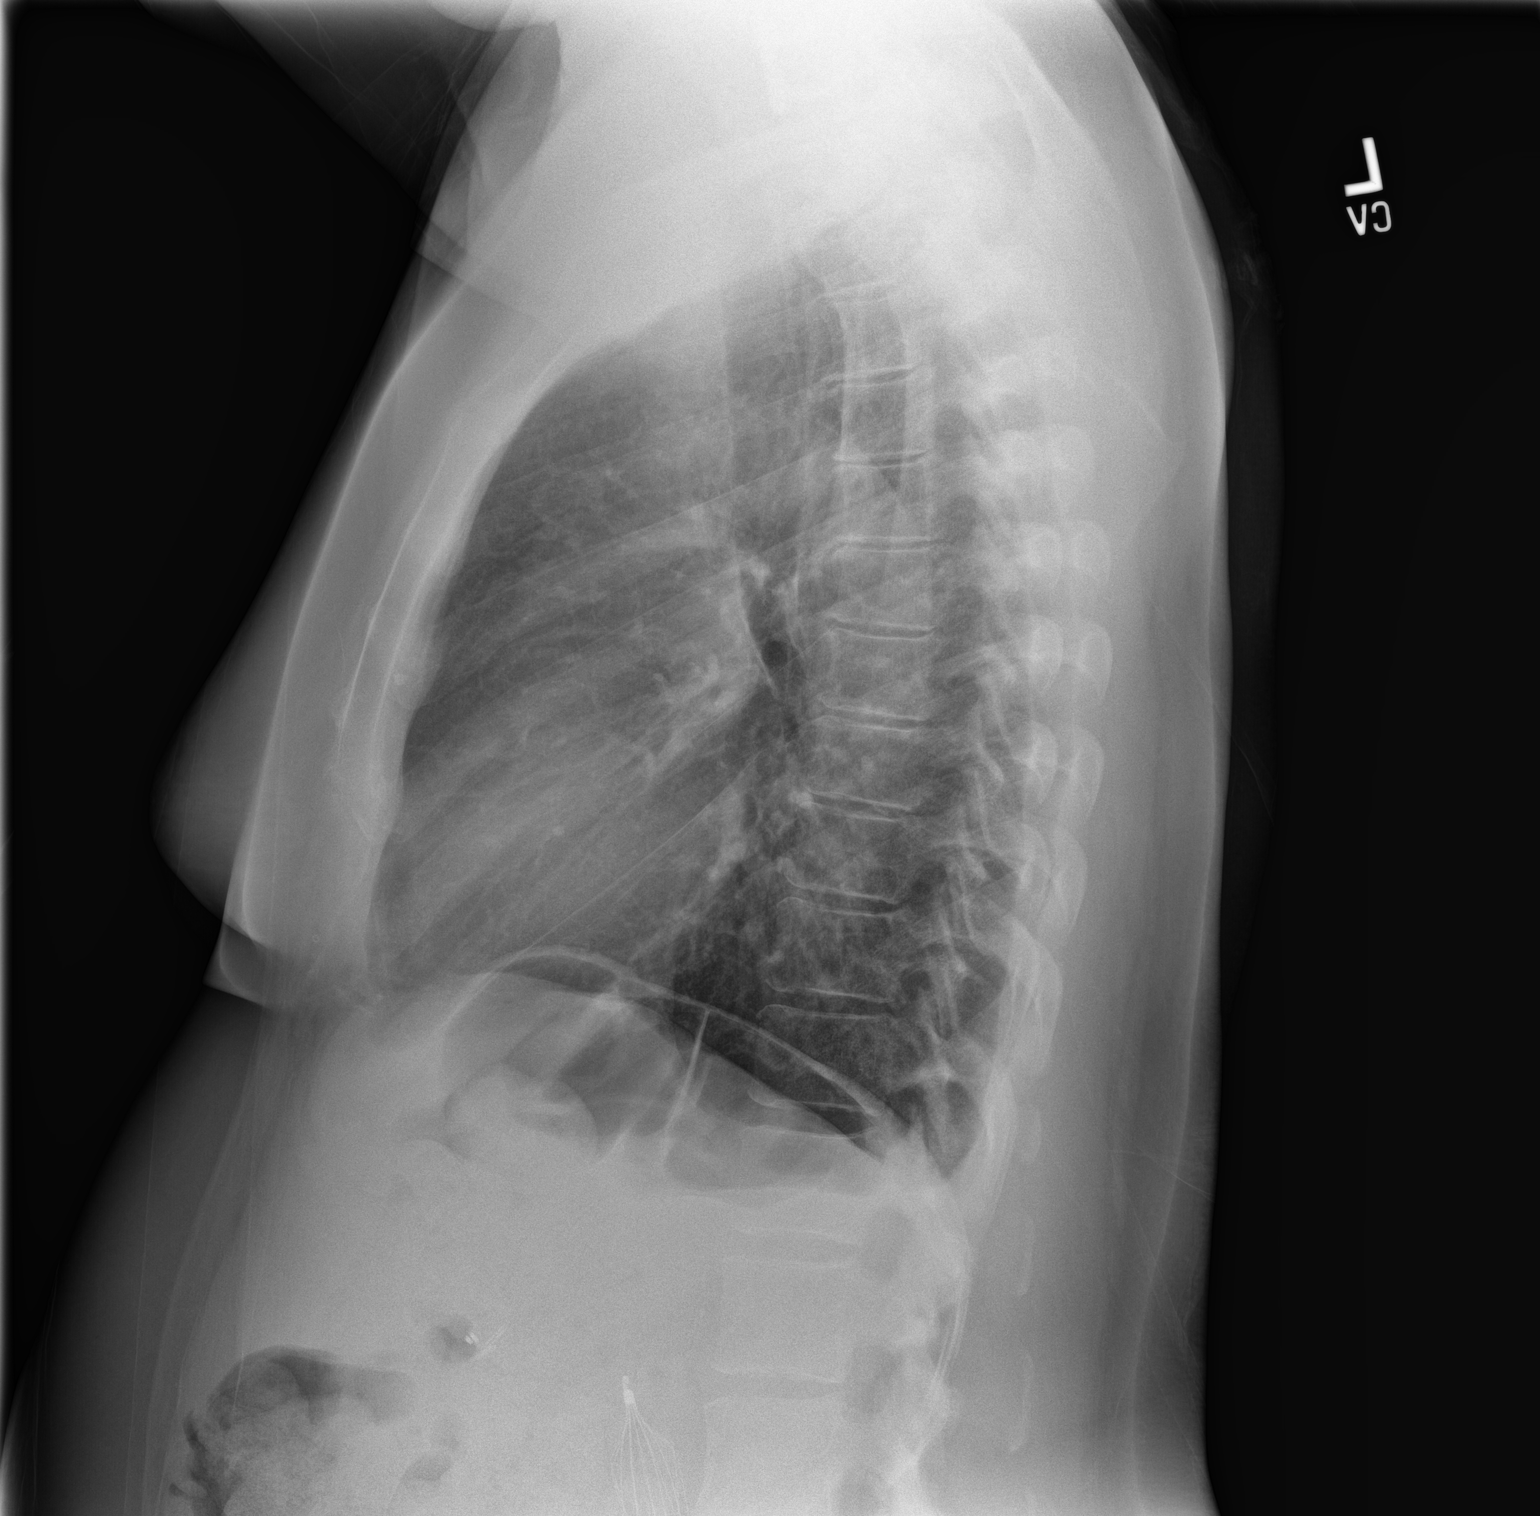

[2 of 2 positions shown; findings below may reference images not displayed]

FINDINGS: Grossly unchanged cardiac silhouette and mediastinal contours. No
focal airspace opacities. No pleural effusion or pneumothorax. There
is minimal pleural parenchymal thickening involving the bilateral
major fissures and within the peripheral aspect of the right minor
fissure. No evidence of edema. No acute osseus abnormalities. Post
lower cervical ACDF, incompletely evaluated. Post cholecystectomy.
An IVC filter is seen within the upper abdomen.
IMPRESSION: No acute cardiopulmonary disease.

## 2016-08-20 ENCOUNTER — Encounter: Payer: Self-pay | Admitting: Family

## 2016-08-20 ENCOUNTER — Ambulatory Visit (INDEPENDENT_AMBULATORY_CARE_PROVIDER_SITE_OTHER): Payer: Medicare Other | Admitting: Family

## 2016-08-20 VITALS — BP 132/76 | HR 73 | Temp 99.2°F | Ht 66.0 in | Wt 195.4 lb

## 2016-08-20 DIAGNOSIS — I201 Angina pectoris with documented spasm: Secondary | ICD-10-CM

## 2016-08-20 DIAGNOSIS — J040 Acute laryngitis: Secondary | ICD-10-CM | POA: Diagnosis not present

## 2016-08-20 NOTE — Progress Notes (Signed)
Subjective:    Patient ID: Valerie Vazquez, female    DOB: 02/20/1956, 60 y.o.   MRN: [id number]  CC: Valerie Vazquez is a 60 y.o. female who presents today for an acute visit.    HPI: CC:  c/o lost voice for 3 weeks, unchanged. Trying to rest voice during day.  Has been coughing less when using inhaler.cough better.  Endorses Post nasal drainage.  No wheezing, sinus pressure, ear pain, sore throat.   Former smoker   ED 12/15 had run out of medication and had concern for PE, due to chest pain; Is back on elliquis now. CXR indicated COPD  ED 12/8 Chest pain trops negative. Negative CT angio.  H/o DVT   HISTORY:  Past Medical History:  Diagnosis Date  . Anemia   . Bilateral renal cysts   . Chronic back pain    "upper and lower" (09/19/2014)  . Depression    "major" (09/19/2014)  . DVT (deep venous thrombosis) ([place])   . Esophageal spasm   . Gallstones   . Gastritis   . Gastroenteritis   . GERD (gastroesophageal reflux disease)   . Headache    "couple /month lately" (09/19/2014)  . High blood pressure   . High cholesterol   . History of blood transfusion 1985   related to hysterectomy  . Hypertension   . IBS (irritable bowel syndrome)   . Migraine    "a few times/yr" (09/19/2014)  . Myocardial infarction 10/2010 X 3   "while hospitalized"  . Osteoarthritis    "qwhere; mostly around my joints" (09/19/2014)  . Pneumonia 04/2014  . PTSD (post-traumatic stress disorder)   . Pulmonary embolism ([place]) 04/2001; 09/19/2014   "after gallbladder OR; "  . Schizoaffective disorder ([place])   . Sleep apnea    "mild" (09/19/2014)  . Snoring    a. sleep study 5/16: No OSA  . Spinal stenosis   . Spondylosis   . Type II diabetes mellitus ([place])    "dx'd in 2007; lost weight; no RX for ~ 4 yr now" (09/19/2014)   Past Surgical History:  Procedure Laterality Date  . ABDOMINAL HYSTERECTOMY  1985  . ANTERIOR CERVICAL DECOMP/DISCECTOMY FUSION  10/2012  . APPENDECTOMY  1985  .  BACK SURGERY    . BREAST CYST EXCISION Right   . BREAST EXCISIONAL BIOPSY Right YRS AGO   NEG  . CARDIAC CATHETERIZATION   2000's; 2009  . CHOLECYSTECTOMY  2002  . EXCISION/RELEASE BURSA HIP Right 12/1984  . HERNIA REPAIR  1985  . LEFT HEART CATHETERIZATION WITH CORONARY ANGIOGRAM N/A 12/09/2014   Procedure: LEFT HEART CATHETERIZATION WITH CORONARY ANGIOGRAM;  Surgeon: Burnell Blanks, MD;  Location: [facility] CATH LAB;  Service: Cardiovascular;  Laterality: N/A;  . TONSILLECTOMY AND ADENOIDECTOMY  ~ 1968  . TOTAL HIP ARTHROPLASTY Left 04-06-2014  . UMBILICAL HERNIA REPAIR  1985  . VENA CAVA FILTER PLACEMENT  03/2014   Family History  Problem Relation Age of Onset  . Stroke Mother     Deceased  . Lung cancer Father     Deceased  . Healthy Daughter   . Healthy Son     x 2  . Other Son     Suicide  . Heart attack Neg Hx     Allergies: Abilify [aripiprazole]; Augmentin [amoxicillin-pot clavulanate]; Bactrim [sulfamethoxazole-trimethoprim]; Ceftin [cefuroxime axetil]; Coconut oil; Diclofenac; Dye fdc red [red dye]; Indocin [indomethacin]; Linaclotide; Lisinopril; Morphine; Morphine and related; Simvastatin; Sulfa antibiotics; Sulfamethoxazole-trimethoprim; Wellbutrin [bupropion]; and Quetiapine fumarate  Current Outpatient Prescriptions on File Prior to Visit  Medication Sig Dispense Refill  . albuterol (PROVENTIL HFA;VENTOLIN HFA) 108 (90 Base) MCG/ACT inhaler Inhale 2 puffs into the lungs every 6 (six) hours as needed for wheezing or shortness of breath. 1 Inhaler 2  . apixaban (ELIQUIS) 5 MG TABS tablet Take 1 tablet (5 mg total) by mouth 2 (two) times daily. 30 tablet 0  . calcium carbonate (TUMS - DOSED IN MG ELEMENTAL CALCIUM) 500 MG chewable tablet Chew 1 tablet by mouth daily as needed for indigestion or heartburn.    . carvedilol (COREG) 3.125 MG tablet Take 1 tablet (3.125 mg total) by mouth 2 (two) times daily with a meal. 30 tablet 0  . dicyclomine (BENTYL) 10 MG capsule TAKE 2  CAPSULES BY MOUTH EVERY SIX HOURS (Patient taking differently: TAKE 1 CAPSULES BY MOUTH THREE TIMES DAILY) 240 capsule 5  . dicyclomine (BENTYL) 20 MG tablet Take 1 tablet (20 mg total) by mouth 3 (three) times daily as needed for spasms. 30 tablet 0  . diltiazem (CARDIZEM CD) 120 MG 24 hr capsule Take 120 mg by mouth daily.    Marland Kitchen diltiazem (CARDIZEM SR) 60 MG 12 hr capsule TAKE 1 CAPSULE (60 MG TOTAL) BY MOUTH EVERY 6 (SIX) HOURS AS NEEDED. 120 capsule 3  . diphenhydrAMINE (BENADRYL) 25 MG tablet Take 1-2 tablets (25-50 mg total) by mouth every 6 (six) hours as needed for itching (Rash). 30 tablet 0  . EPINEPHrine 0.3 mg/0.3 mL IJ SOAJ injection Inject 0.3 mLs (0.3 mg total) into the muscle once. 1 Device 0  . escitalopram (LEXAPRO) 10 MG tablet Take 1 tablet (10 mg total) by mouth daily. 30 tablet 0  . furosemide (LASIX) 20 MG tablet TAKE 1 TABLET BY MOUTH EVERY DAY (Patient taking differently: TAKE 1 TABLET BY MOUTH EVERY DAY prn) 90 tablet 2  . gabapentin (NEURONTIN) 600 MG tablet Take 0.5 tablets (300 mg total) by mouth every 8 (eight) hours. 30 tablet 0  . isosorbide mononitrate (IMDUR) 30 MG 24 hr tablet Take 1 tablet (30 mg total) by mouth daily. 30 tablet 0  . Multiple Vitamin (MULTIVITAMIN WITH MINERALS) TABS tablet Take 2 tablets by mouth daily.     . mupirocin ointment (BACTROBAN) 2 % Place 1 application into the nose 3 (three) times daily. 22 g 0  . nitrofurantoin, macrocrystal-monohydrate, (MACROBID) 100 MG capsule Take 1 capsule (100 mg total) by mouth 2 (two) times daily. 20 capsule 0  . ondansetron (ZOFRAN) 4 MG tablet Take 1 tablet (4 mg total) by mouth every 8 (eight) hours as needed for nausea or vomiting. 90 tablet 5  . oxyCODONE-acetaminophen (PERCOCET/ROXICET) 5-325 MG tablet Take 1 tablet by mouth 2 (two) times daily as needed for severe pain. 30 tablet 0  . pantoprazole (PROTONIX) 40 MG tablet TAKE ONE TABLET BY MOUTH EVERY DAY 30 tablet 5  . potassium chloride (K-DUR) 10 MEQ  tablet Take 1 tablet (10 mEq total) by mouth 2 (two) times daily. (Patient taking differently: Take 10 mEq by mouth 2 (two) times daily as needed. ) 60 tablet 11  . traZODone (DESYREL) 50 MG tablet Take 1 tablet (50 mg total) by mouth at bedtime. (Patient taking differently: Take 50 mg by mouth at bedtime as needed for sleep. ) 30 tablet 5  . triamcinolone (NASACORT AQ) 55 MCG/ACT AERO nasal inhaler Place 2 sprays into the nose daily. 1 Inhaler 12   No current facility-administered medications on file prior to visit.  Social History  Substance Use Topics  . Smoking status: Former Smoker    Packs/day: 1.50    Years: 10.00    Types: Cigarettes    Quit date: 04/12/1979  . Smokeless tobacco: Never Used  . Alcohol use No    Review of Systems  Constitutional: Negative for chills and fever.  HENT: Positive for voice change. Negative for congestion, sinus pain, sinus pressure, sore throat and trouble swallowing.   Respiratory: Positive for choking. Negative for cough, chest tightness, shortness of breath and wheezing.   Cardiovascular: Negative for chest pain, palpitations and leg swelling.  Gastrointestinal: Negative for nausea and vomiting.      Objective:    BP 132/76   Pulse 73   Temp 99.2 F (37.3 C) (Oral)   Ht 5\' 6"  (1.676 m)   Wt 195 lb 6.4 oz (88.6 kg)   SpO2 97%   BMI 31.54 kg/m    Physical Exam  Constitutional: She appears well-developed and well-nourished.  HENT:  Head: Normocephalic and atraumatic.  Right Ear: Hearing, tympanic membrane, external ear and ear canal normal. No drainage, swelling or tenderness. No foreign bodies. Tympanic membrane is not erythematous and not bulging. No middle ear effusion. No decreased hearing is noted.  Left Ear: Hearing, tympanic membrane, external ear and ear canal normal. No drainage, swelling or tenderness. No foreign bodies. Tympanic membrane is not erythematous and not bulging.  No middle ear effusion. No decreased hearing is  noted.  Nose: Nose normal. No rhinorrhea. Right sinus exhibits no maxillary sinus tenderness and no frontal sinus tenderness. Left sinus exhibits no maxillary sinus tenderness and no frontal sinus tenderness.  Mouth/Throat: Uvula is midline and mucous membranes are normal. Posterior oropharyngeal erythema present. No oropharyngeal exudate, posterior oropharyngeal edema or tonsillar abscesses.  Eyes: Conjunctivae are normal.  Cardiovascular: Regular rhythm, normal heart sounds and normal pulses.   Pulmonary/Chest: Effort normal and breath sounds normal. She has no wheezes. She has no rhonchi. She has no rales.  Lymphadenopathy:       Head (right side): No submental, no submandibular, no tonsillar, no preauricular, no posterior auricular and no occipital adenopathy present.       Head (left side): No submental, no submandibular, no tonsillar, no preauricular, no posterior auricular and no occipital adenopathy present.    She has no cervical adenopathy.  Neurological: She is alert.  Skin: Skin is warm and dry.  Psychiatric: She has a normal mood and affect. Her speech is normal and behavior is normal. Thought content normal.  Vitals reviewed.      Assessment & Plan:  1. Laryngitis Patient and I agreed on conservative treatment for suspected Viral laryngitis. We agreed to treat cough when necessary Tessalon Perles and postnasal drip with Flonase which are likely contributing to hoarseness. Return precautions given.     I am having [name] maintain her multivitamin with minerals, dicyclomine, traZODone, calcium carbonate, diphenhydrAMINE, furosemide, ondansetron, pantoprazole, triamcinolone, EPINEPHrine, potassium chloride, oxyCODONE-acetaminophen, diltiazem, nitrofurantoin (macrocrystal-monohydrate), dicyclomine, mupirocin ointment, diltiazem, apixaban, carvedilol, escitalopram, gabapentin, isosorbide mononitrate, albuterol, clindamycin, and cyclobenzaprine.   Meds ordered this encounter    Medications  . clindamycin (CLEOCIN) 300 MG capsule  . cyclobenzaprine (FLEXERIL) 5 MG tablet    Return precautions given.   Risks, benefits, and alternatives of the medications and treatment plan prescribed today were discussed, and patient expressed understanding.   Education regarding symptom management and diagnosis given to patient on AVS.  Continue to follow with [name], MD for routine health maintenance.  Valerie Vazquez and I agreed with plan.   Mable Paris, FNP

## 2016-08-20 NOTE — Progress Notes (Signed)
Pre visit review using our clinic review tool, if applicable. No additional management support is needed unless otherwise documented below in the visit note. 

## 2016-08-21 ENCOUNTER — Encounter: Payer: Self-pay | Admitting: Family

## 2016-08-22 ENCOUNTER — Other Ambulatory Visit: Payer: Self-pay | Admitting: *Deleted

## 2016-08-22 DIAGNOSIS — G894 Chronic pain syndrome: Secondary | ICD-10-CM

## 2016-08-22 DIAGNOSIS — R110 Nausea: Secondary | ICD-10-CM

## 2016-08-22 MED ORDER — GABAPENTIN 600 MG PO TABS: 300.0000 mg | ORAL_TABLET | Freq: Three times a day (TID) | ORAL | 0 refills | Status: DC

## 2016-08-22 MED ORDER — ONDANSETRON HCL 4 MG PO TABS: 4.0000 mg | ORAL_TABLET | Freq: Three times a day (TID) | ORAL | 0 refills | Status: DC | PRN

## 2016-08-22 NOTE — Telephone Encounter (Signed)
Short-term refills given to provide until patient sees [place] in the office next week

## 2016-08-22 NOTE — Telephone Encounter (Signed)
Last filled zofran 90 5rf 08/15/15  by Dr.Sundaram Last filled gabapentin 08/15/16 30 0.5 tabs every 8 hours by Dr.Malinda

## 2016-08-22 NOTE — Telephone Encounter (Signed)
Patient has requested a medication refill for gabapentin and ondansetron  Ph[facility]  Pt contact [phone]

## 2016-08-25 ENCOUNTER — Emergency Department: Payer: Medicare Other

## 2016-08-25 ENCOUNTER — Encounter: Payer: Self-pay | Admitting: Emergency Medicine

## 2016-08-25 ENCOUNTER — Emergency Department
Admission: EM | Admit: 2016-08-25 | Discharge: 2016-08-25 | Disposition: A | Discharge status: Home / Self Care | Payer: Medicare Other | Attending: Emergency Medicine | Admitting: Emergency Medicine

## 2016-08-25 DIAGNOSIS — R05 Cough: Secondary | ICD-10-CM

## 2016-08-25 DIAGNOSIS — Z79899 Other long term (current) drug therapy: Secondary | ICD-10-CM | POA: Insufficient documentation

## 2016-08-25 DIAGNOSIS — I10 Essential (primary) hypertension: Secondary | ICD-10-CM | POA: Insufficient documentation

## 2016-08-25 DIAGNOSIS — Z87891 Personal history of nicotine dependence: Secondary | ICD-10-CM | POA: Insufficient documentation

## 2016-08-25 DIAGNOSIS — J40 Bronchitis, not specified as acute or chronic: Secondary | ICD-10-CM

## 2016-08-25 DIAGNOSIS — R059 Cough, unspecified: Secondary | ICD-10-CM

## 2016-08-25 NOTE — ED Notes (Signed)
MD at bedside. 

## 2016-08-25 NOTE — ED Triage Notes (Signed)
Pt presents with cough x 3 weeks. She has been seen here twice for the same; given inhaler, which she has used but has "backed off" and is using it less because she was feeling better. She states that she is feeling worse again today. Pt alert & oriented with NAD noted.

## 2016-08-25 NOTE — Discharge Instructions (Signed)
Please seek medical attention for any high fevers, chest pain, shortness of breath, change in behavior, persistent vomiting, bloody stool or any other new or concerning symptoms.  

## 2016-08-25 NOTE — ED Provider Notes (Signed)
[facility] Emergency Department Provider Note    ____________________________________________   I have reviewed the triage vital signs and the nursing notes.   HISTORY  Chief Complaint Cough   History limited by: Not Limited   HPI Valerie Vazquez is a 60 y.o. female who presents to the emergency department today because of concern for cough and possible pneumonia. The patient has had a cough with some shortness of breath for a little over two weeks. This is her third emergency department visit for this complaint. The patient states that she had felt like she was getting better but for the past three days it has been getting worse. She states that the person she is staying with said her son got pneumonia and that she might have pneumonia. No chest pain. No fevers.   Past Medical History:  Diagnosis Date  . Anemia   . Bilateral renal cysts   . Chronic back pain    "upper and lower" (09/19/2014)  . Depression    "major" (09/19/2014)  . DVT (deep venous thrombosis) ([place])   . Esophageal spasm   . Gallstones   . Gastritis   . Gastroenteritis   . GERD (gastroesophageal reflux disease)   . Headache    "couple /month lately" (09/19/2014)  . High blood pressure   . High cholesterol   . History of blood transfusion 1985   related to hysterectomy  . Hypertension   . IBS (irritable bowel syndrome)   . Migraine    "a few times/yr" (09/19/2014)  . Myocardial infarction 10/2010 X 3   "while hospitalized"  . Osteoarthritis    "qwhere; mostly around my joints" (09/19/2014)  . Pneumonia 04/2014  . PTSD (post-traumatic stress disorder)   . Pulmonary embolism ([place]) 04/2001; 09/19/2014   "after gallbladder OR; "  . Schizoaffective disorder ([place])   . Sleep apnea    "mild" (09/19/2014)  . Snoring    a. sleep study 5/16: No OSA  . Spinal stenosis   . Spondylosis   . Type II diabetes mellitus ([place])    "dx'd in 2007; lost weight; no RX for ~ 4 yr now"  (09/19/2014)    Patient Active Problem List   Diagnosis Date Noted  . Rash and nonspecific skin eruption 04/16/2016  . Colitis 04/08/2016  . Tremor 03/19/2016  . Left shoulder pain 02/18/2016  . Abdominal distension 02/18/2016  . Absolute anemia 01/29/2016  . Chronic pain 01/29/2016  . Neurogenic pain 01/29/2016  . Musculoskeletal pain 01/29/2016  . Long term current use of opiate analgesic 01/29/2016  . Long term prescription opiate use 01/29/2016  . Opiate use (15 MME/Day) 01/29/2016  . Encounter for therapeutic drug level monitoring 01/29/2016  . Encounter for pain management planning 01/29/2016  . Chronic neck pain (midline) 01/29/2016  . Chronic chest wall pain 01/29/2016  . Chronic low back pain (Location of Secondary source of pain) (Bilateral) (R>L) 01/29/2016  . Disturbance of skin sensation 01/29/2016  . Chronic shoulder pain (Location of Primary Source of Pain) (Left) 01/29/2016  . Chronic hip pain (Location of Tertiary source of pain) (Bilateral) (R>L) 01/29/2016  . Osteoarthritis of hip (Bilateral) 01/29/2016  . Lumbar facet arthropathy (Severe at L2-3, L3-4, L4-5, and L5-S1) 01/29/2016  . Lumbar central spinal stenosis (moderate at L4-5; mild at L2-3 and L3-4) 01/29/2016  . Lumbar foraminal stenosis (L4-5) (Right) 01/29/2016  . Lumbar facet syndrome 01/29/2016  . Hx of cervical spine surgery 01/29/2016  . Numbness 11/19/2015  . Right wrist  pain 11/09/2015  . Breast pain 10/19/2015  . Chest pain 10/14/2015  . Headache 10/14/2015  . Abdominal pain 10/14/2015  . Lip swelling 10/14/2015  . Pulmonary embolism ([place]) 08/15/2015  . Bilateral pneumonia 08/15/2015  . Nausea 08/15/2015  . Shortness of breath 07/31/2015  . Pleural effusion, bilateral 07/31/2015  . Cough 07/31/2015  . Chronic pain of multiple sites 06/05/2015  . Chronic lumbosacral pain 06/05/2015  . Esophageal spasm 06/05/2015  . History of DVT (deep vein thrombosis) 02/25/2015  . Hypercementosis  02/13/2015  . Constipation 12/12/2014  . Coronary vasospasm ([place]) 12/09/2014  . History of pulmonary embolus (PE) 09/19/2014  . Impingement syndrome of shoulder 08/30/2014  . Impingement syndrome of left shoulder 08/30/2014  . Injury of superior glenoid labrum of shoulder joint 08/16/2014  . Supraspinatus tenosynovitis 08/16/2014  . Cervical spondylosis without myelopathy 08/16/2014  . Anxiety and depression 04/13/2014  . Osteoarthritis of hip (Left) 04/11/2014  . S/P THR (total hip replacement) (Left) 04/11/2014  . IBS (irritable bowel syndrome) 04/11/2014  . GERD (gastroesophageal reflux disease) 04/11/2014  . Insomnia 04/11/2014  . Essential hypertension, benign 10/28/2013  . Left hip pain 10/28/2013  . Anemia 10/28/2013  . HLD (hyperlipidemia) 10/28/2013    Past Surgical History:  Procedure Laterality Date  . ABDOMINAL HYSTERECTOMY  1985  . ANTERIOR CERVICAL DECOMP/DISCECTOMY FUSION  10/2012  . APPENDECTOMY  1985  . BACK SURGERY    . BREAST CYST EXCISION Right   . BREAST EXCISIONAL BIOPSY Right YRS AGO   NEG  . CARDIAC CATHETERIZATION   2000's; 2009  . CHOLECYSTECTOMY  2002  . EXCISION/RELEASE BURSA HIP Right 12/1984  . HERNIA REPAIR  1985  . LEFT HEART CATHETERIZATION WITH CORONARY ANGIOGRAM N/A 12/09/2014   Procedure: LEFT HEART CATHETERIZATION WITH CORONARY ANGIOGRAM;  Surgeon: Burnell Blanks, MD;  Location: [facility] CATH LAB;  Service: Cardiovascular;  Laterality: N/A;  . TONSILLECTOMY AND ADENOIDECTOMY  ~ 1968  . TOTAL HIP ARTHROPLASTY Left 04-06-2014  . UMBILICAL HERNIA REPAIR  1985  . VENA CAVA FILTER PLACEMENT  03/2014    Prior to Admission medications   Medication Sig Start Date End Date Taking? Authorizing Provider  albuterol (PROVENTIL HFA;VENTOLIN HFA) 108 (90 Base) MCG/ACT inhaler Inhale 2 puffs into the lungs every 6 (six) hours as needed for wheezing or shortness of breath. 08/15/16   Nena Polio, MD  apixaban (ELIQUIS) 5 MG TABS tablet Take 1 tablet (5  mg total) by mouth 2 (two) times daily. 08/15/16   Nena Polio, MD  calcium carbonate (TUMS - DOSED IN MG ELEMENTAL CALCIUM) 500 MG chewable tablet Chew 1 tablet by mouth daily as needed for indigestion or heartburn.    Historical Provider, MD  carvedilol (COREG) 3.125 MG tablet Take 1 tablet (3.125 mg total) by mouth 2 (two) times daily with a meal. 08/15/16   Nena Polio, MD  clindamycin (CLEOCIN) 300 MG capsule  07/16/16   Historical Provider, MD  cyclobenzaprine (FLEXERIL) 5 MG tablet  07/27/16   Historical Provider, MD  dicyclomine (BENTYL) 10 MG capsule TAKE 2 CAPSULES BY MOUTH EVERY SIX HOURS Patient taking differently: TAKE 1 CAPSULES BY MOUTH THREE TIMES DAILY 04/09/15   [name], MD  dicyclomine (BENTYL) 20 MG tablet Take 1 tablet (20 mg total) by mouth 3 (three) times daily as needed for spasms. 04/11/16 04/11/17  Earleen Newport, MD  diltiazem (CARDIZEM CD) 120 MG 24 hr capsule Take 120 mg by mouth daily. 12/26/15   Historical Provider, MD  diltiazem (CARDIZEM SR) 60 MG 12 hr capsule TAKE 1 CAPSULE (60 MG TOTAL) BY MOUTH EVERY 6 (SIX) HOURS AS NEEDED. 06/30/16   Manus Gunning, MD  diphenhydrAMINE (BENADRYL) 25 MG tablet Take 1-2 tablets (25-50 mg total) by mouth every 6 (six) hours as needed for itching (Rash). 07/14/15   Antonietta Breach, PA-C  EPINEPHrine 0.3 mg/0.3 mL IJ SOAJ injection Inject 0.3 mLs (0.3 mg total) into the muscle once. 10/12/15   [name], MD  escitalopram (LEXAPRO) 10 MG tablet Take 1 tablet (10 mg total) by mouth daily. 08/15/16   Nena Polio, MD  furosemide (LASIX) 20 MG tablet TAKE 1 TABLET BY MOUTH EVERY DAY Patient taking differently: TAKE 1 TABLET BY MOUTH EVERY DAY prn 08/14/15   [name], MD  gabapentin (NEURONTIN) 600 MG tablet Take 0.5 tablets (300 mg total) by mouth every 8 (eight) hours. 08/22/16   [name], MD  isosorbide mononitrate (IMDUR) 30 MG 24 hr tablet Take 1 tablet (30 mg total) by mouth daily. 08/15/16    Nena Polio, MD  Multiple Vitamin (MULTIVITAMIN WITH MINERALS) TABS tablet Take 2 tablets by mouth daily.     Historical Provider, MD  mupirocin ointment (BACTROBAN) 2 % Place 1 application into the nose 3 (three) times daily. 04/16/16   [name], MD  nitrofurantoin, macrocrystal-monohydrate, (MACROBID) 100 MG capsule Take 1 capsule (100 mg total) by mouth 2 (two) times daily. 04/11/16   Earleen Newport, MD  ondansetron (ZOFRAN) 4 MG tablet Take 1 tablet (4 mg total) by mouth every 8 (eight) hours as needed for nausea or vomiting. 08/22/16   [name], MD  oxyCODONE-acetaminophen (PERCOCET/ROXICET) 5-325 MG tablet Take 1 tablet by mouth 2 (two) times daily as needed for severe pain. 02/18/16   [name], MD  pantoprazole (PROTONIX) 40 MG tablet TAKE ONE TABLET BY MOUTH EVERY DAY 09/03/15   [name], MD  potassium chloride (K-DUR) 10 MEQ tablet Take 1 tablet (10 mEq total) by mouth 2 (two) times daily. Patient taking differently: Take 10 mEq by mouth 2 (two) times daily as needed.  10/22/15   Burnell Blanks, MD  traZODone (DESYREL) 50 MG tablet Take 1 tablet (50 mg total) by mouth at bedtime. Patient taking differently: Take 50 mg by mouth at bedtime as needed for sleep.  04/24/15   [name], MD  triamcinolone (NASACORT AQ) 55 MCG/ACT AERO nasal inhaler Place 2 sprays into the nose daily. 10/02/15   Wilhelmina Mcardle, MD    Allergies Abilify [aripiprazole]; Augmentin [amoxicillin-pot clavulanate]; Bactrim [sulfamethoxazole-trimethoprim]; Ceftin [cefuroxime axetil]; Coconut oil; Diclofenac; Dye fdc red [red dye]; Indocin [indomethacin]; Linaclotide; Lisinopril; Morphine; Morphine and related; Simvastatin; Sulfa antibiotics; Sulfamethoxazole-trimethoprim; Wellbutrin [bupropion]; and Quetiapine fumarate  Family History  Problem Relation Age of Onset  . Stroke Mother     Deceased  . Lung cancer Father     Deceased  . Healthy Daughter   . Healthy Son      x 2  . Other Son     Suicide  . Heart attack Neg Hx     Social History Social History  Substance Use Topics  . Smoking status: Former Smoker    Packs/day: 1.50    Years: 10.00    Types: Cigarettes    Quit date: 04/12/1979  . Smokeless tobacco: Never Used  . Alcohol use No    Review of Systems  Constitutional: Negative for fever. Cardiovascular: Negative for chest pain. Respiratory: Positive  for cough, shortness of breath Gastrointestinal: Negative for abdominal pain, vomiting and diarrhea. Genitourinary: Negative for dysuria. Musculoskeletal: Negative for back pain. Skin: Negative for rash. Neurological: Negative for headaches, focal weakness or numbness.  10-point ROS otherwise negative.  ____________________________________________   PHYSICAL EXAM:  VITAL SIGNS: ED Triage Vitals  Enc Vitals Group     BP 08/25/16 1013 (!) 149/86     Pulse Rate 08/25/16 1013 76     Resp 08/25/16 1013 15     Temp 08/25/16 1013 98.2 F (36.8 C)     Temp Source 08/25/16 1013 Oral     SpO2 08/25/16 1013 98 %     Weight 08/25/16 1014 194 lb (88 kg)     Height 08/25/16 1014 5\' 6"  (1.676 m)     Head Circumference --      Peak Flow --      Pain Score --      Pain Loc --      Pain Edu? --      Excl. in [place]? --      Constitutional: Alert and oriented. Well appearing and in no distress. Eyes: Conjunctivae are normal. Normal extraocular movements. ENT   Head: Normocephalic and atraumatic.   Nose: No congestion/rhinnorhea.   Mouth/Throat: Mucous membranes are moist.   Neck: No stridor. Hematological/Lymphatic/Immunilogical: No cervical lymphadenopathy. Cardiovascular: Normal rate, regular rhythm.  No murmurs, rubs, or gallops.  Respiratory: Normal respiratory effort without tachypnea nor retractions. Breath sounds are clear and equal bilaterally. No wheezes/rales/rhonchi. Gastrointestinal: Soft and non tender. No rebound. No guarding.  Genitourinary:  Deferred Musculoskeletal: Normal range of motion in all extremities. No lower extremity edema. Neurologic:  Normal speech and language. No gross focal neurologic deficits are appreciated.  Skin:  Skin is warm, dry and intact. No rash noted. Psychiatric: Mood and affect are normal. Speech and behavior are normal. Patient exhibits appropriate insight and judgment.  ____________________________________________    LABS (pertinent positives/negatives)  None  ____________________________________________   EKG  None  ____________________________________________    RADIOLOGY  CXR   IMPRESSION:  No active cardiopulmonary disease.     I, Fabion Gatson, personally viewed and evaluated these images (plain radiographs) as part of my medical decision making. ____________________________________________   PROCEDURES  Procedures  ____________________________________________   INITIAL IMPRESSION / ASSESSMENT AND PLAN / ED COURSE  Pertinent labs & imaging results that were available during my care of the patient were reviewed by me and considered in my medical decision making (see chart for details).  She presented to the emergency department today with continued cough and concern for pneumonia. Chest x-ray without any signs of infiltrate. Patient's lungs were clear on auscultation. She was in no distress. Will discharge home.  ____________________________________________   FINAL CLINICAL IMPRESSION(S) / ED DIAGNOSES  Final diagnoses:  Cough  Bronchitis     Note: This dictation was prepared with Dragon dictation. Any transcriptional errors that result from this process are unintentional     Nance Pear, MD 08/25/16 1137

## 2016-08-26 ENCOUNTER — Telehealth: Payer: Self-pay | Admitting: Family Medicine

## 2016-08-26 NOTE — Telephone Encounter (Signed)
FYI

## 2016-08-26 NOTE — Telephone Encounter (Signed)
Pt called and stated that she was in the ED yesterday and they did nothing for her. She stated that she is still coughing and gaging and she did vomit this morning, she also stated that she did cough off something. Please advise, thank you!  Call pt @ [phone]

## 2016-08-26 NOTE — Telephone Encounter (Signed)
Patient stated this morning her cough is worse and is coughing up thick white to yellow mucus tinged with pink was seen in ED on 08/25/16, discharged  with her inhaler ( Proventil) imaging reported no infiltrate or pneumonia DX with acute bronchitis. Patient talking in full sentences , afebrile but does report chill and being achy. Patient has an appointment on 08/27/16 at  4 already scheduled with PCP, advised patient to keep this appointment  And if she becomes short of breath or if symptoms worsen to go to ED.

## 2016-08-27 ENCOUNTER — Emergency Department
Admission: EM | Admit: 2016-08-27 | Discharge: 2016-08-28 | Disposition: A | Discharge status: Other Acute Inpatient Hospital | Payer: Medicare Other | Attending: Emergency Medicine | Admitting: Emergency Medicine

## 2016-08-27 ENCOUNTER — Ambulatory Visit (INDEPENDENT_AMBULATORY_CARE_PROVIDER_SITE_OTHER): Payer: Medicare Other | Admitting: Family Medicine

## 2016-08-27 ENCOUNTER — Encounter: Payer: Self-pay | Admitting: Family Medicine

## 2016-08-27 ENCOUNTER — Encounter: Payer: Self-pay | Admitting: Emergency Medicine

## 2016-08-27 VITALS — BP 108/80 | HR 104 | Temp 98.5°F | Wt 190.0 lb

## 2016-08-27 DIAGNOSIS — E119 Type 2 diabetes mellitus without complications: Secondary | ICD-10-CM | POA: Diagnosis not present

## 2016-08-27 DIAGNOSIS — F329 Major depressive disorder, single episode, unspecified: Secondary | ICD-10-CM | POA: Insufficient documentation

## 2016-08-27 DIAGNOSIS — I10 Essential (primary) hypertension: Secondary | ICD-10-CM | POA: Diagnosis not present

## 2016-08-27 DIAGNOSIS — R45851 Suicidal ideations: Secondary | ICD-10-CM | POA: Diagnosis not present

## 2016-08-27 DIAGNOSIS — I201 Angina pectoris with documented spasm: Secondary | ICD-10-CM

## 2016-08-27 DIAGNOSIS — Z79899 Other long term (current) drug therapy: Secondary | ICD-10-CM | POA: Insufficient documentation

## 2016-08-27 DIAGNOSIS — T1491XA Suicide attempt, initial encounter: Secondary | ICD-10-CM

## 2016-08-27 DIAGNOSIS — J0100 Acute maxillary sinusitis, unspecified: Secondary | ICD-10-CM

## 2016-08-27 DIAGNOSIS — Z87891 Personal history of nicotine dependence: Secondary | ICD-10-CM | POA: Diagnosis not present

## 2016-08-27 HISTORY — DX: Suicide attempt, initial encounter: T14.91XA

## 2016-08-27 HISTORY — DX: Suicidal ideations: R45.851

## 2016-08-27 LAB — COMPREHENSIVE METABOLIC PANEL WITH GFR
ALT: 22 U/L (ref 14–54)
AST: 28 U/L (ref 15–41)
Albumin: 4.3 g/dL (ref 3.5–5.0)
Alkaline Phosphatase: 111 U/L (ref 38–126)
Anion gap: 8 (ref 5–15)
BUN: 13 mg/dL (ref 6–20)
CO2: 27 mmol/L (ref 22–32)
Calcium: 10 mg/dL (ref 8.9–10.3)
Chloride: 104 mmol/L (ref 101–111)
Creatinine, Ser: 0.99 mg/dL (ref 0.44–1.00)
GFR calc Af Amer: >60 mL/min
GFR calc non Af Amer: 60 mL/min — ABNORMAL LOW
Glucose, Bld: 118 mg/dL — ABNORMAL HIGH (ref 65–99)
Potassium: 4.5 mmol/L (ref 3.5–5.1)
Sodium: 139 mmol/L (ref 135–145)
Total Bilirubin: 0.5 mg/dL (ref 0.3–1.2)
Total Protein: 8.2 g/dL — ABNORMAL HIGH (ref 6.5–8.1)

## 2016-08-27 LAB — CBC WITH DIFFERENTIAL/PLATELET
Basophils Absolute: 0.2 K/uL — ABNORMAL HIGH (ref 0–0.1)
Basophils Relative: 2 %
Eosinophils Absolute: 0.2 K/uL (ref 0–0.7)
Eosinophils Relative: 2 %
HCT: 38.1 % (ref 35.0–47.0)
Hemoglobin: 12.7 g/dL (ref 12.0–16.0)
Lymphocytes Relative: 15 %
Lymphs Abs: 1.1 K/uL (ref 1.0–3.6)
MCH: 30.7 pg (ref 26.0–34.0)
MCHC: 33.4 g/dL (ref 32.0–36.0)
MCV: 91.9 fL (ref 80.0–100.0)
Monocytes Absolute: 0.5 K/uL (ref 0.2–0.9)
Monocytes Relative: 6 %
Neutro Abs: 5.6 K/uL (ref 1.4–6.5)
Neutrophils Relative %: 75 %
Platelets: 408 K/uL (ref 150–440)
RBC: 4.15 MIL/uL (ref 3.80–5.20)
RDW: 14.1 % (ref 11.5–14.5)
WBC: 7.6 K/uL (ref 3.6–11.0)

## 2016-08-27 LAB — URINE DRUG SCREEN, QUALITATIVE (ARMC ONLY)
Amphetamines, Ur Screen: NOT DETECTED
Barbiturates, Ur Screen: NOT DETECTED
Benzodiazepine, Ur Scrn: NOT DETECTED
Cannabinoid 50 Ng, Ur ~~LOC~~: NOT DETECTED
Cocaine Metabolite,Ur ~~LOC~~: NOT DETECTED
MDMA (Ecstasy)Ur Screen: NOT DETECTED
Methadone Scn, Ur: NOT DETECTED
Opiate, Ur Screen: NOT DETECTED
Phencyclidine (PCP) Ur S: NOT DETECTED
Tricyclic, Ur Screen: POSITIVE — AB

## 2016-08-27 LAB — URINALYSIS, COMPLETE (UACMP) WITH MICROSCOPIC
Bacteria, UA: NONE SEEN
Bilirubin Urine: NEGATIVE
Glucose, UA: NEGATIVE mg/dL
Hgb urine dipstick: NEGATIVE
Ketones, ur: NEGATIVE mg/dL
Nitrite: NEGATIVE
Protein, ur: 30 mg/dL — AB
Specific Gravity, Urine: 1.02 (ref 1.005–1.030)
pH: 6 (ref 5.0–8.0)

## 2016-08-27 LAB — ETHANOL: Alcohol, Ethyl (B): <5 mg/dL (ref <5)

## 2016-08-27 NOTE — ED Notes (Signed)
Charge nurse aware.  No rooms available, sitter not available at this time.  Pt sitting in front of [employer] in triage at this time.

## 2016-08-27 NOTE — Assessment & Plan Note (Signed)
Patient with sinus congestion and tenderness. Most consistent with sinusitis. Possible bronchitis as well. Discussed treatment with prednisone and antibiotics though she does not have money to afford these at this time. We are sending her to the emergency room for depression and suicide attempt. Possibly they can provide her with treatment there.

## 2016-08-27 NOTE — ED Triage Notes (Signed)
Arrived via EMS to triage.  Reports suicide attempt 3 days ago with od on bp pills.  Today feeling depressed and SI again.

## 2016-08-27 NOTE — Assessment & Plan Note (Signed)
Patient reports significant depression and anxiety that has been building up over a period of time since her significant other died earlier this year. She reports a suicide attempt in the last several days by pill overdose. She is unable to tell me what pills she took. She notes she is unable to say that she is safe on her own outside of the office. She continues to have suicidal ideation though has no current plan at this time. She can't promise that she has no intent at this time. Given this and her suicide attempt we are having EMS transport her to the emergency room for further evaluation and treatment. CMA contacted the ED and informed and the patient was on her way.

## 2016-08-27 NOTE — Telephone Encounter (Signed)
Seen in the office today.

## 2016-08-27 NOTE — Progress Notes (Signed)
  [name], MD Phone: [phone]  Valerie Vazquez is a 60 y.o. female who presents today for same-day visit.  Patient notes about a month's worth of cough and congestion. Has wheezing and coughing and sweating. No fevers. Nonproductive cough. Some mild shortness of breath with this. Went to the emergency room 3 times for this. Given an inhaler with little improvement. Has not been on any antibiotics or prednisone. Albuterol is of little benefit. She is taking her Eliquis.  Patient notes she's been depressed and anxious. Given her recent illnesses her current roommate kicked her out as she felt as though the patient was a threat to her and her family given her cough and congestion with no improvement. Patient notes she has felt incredibly down and feels like everything is spiraling around her since her significant other died. She has had thoughts of harming herself on a daily basis for some time now. He notes she took as many pills that she could sometime in the last 1-2 days with the intent to harm herself. She has thoughts of harming herself at this time.  PMH: Former smoker  ROS see history of present illness  Objective  Physical Exam Vitals:   08/27/16 1602  BP: 108/80  Pulse: (!) 104  Temp: 98.5 F (36.9 C)    BP Readings from Last 3 Encounters:  08/27/16 108/80  08/25/16 140/87  08/20/16 132/76   Wt Readings from Last 3 Encounters:  08/27/16 190 lb (86.2 kg)  08/25/16 194 lb (88 kg)  08/20/16 195 lb 6.4 oz (88.6 kg)    Physical Exam  Constitutional: No distress.  HENT:  Head: Normocephalic and atraumatic.  Mouth/Throat: Oropharynx is clear and moist. No oropharyngeal exudate.  Eyes: Conjunctivae are normal. Pupils are equal, round, and reactive to light.  Cardiovascular: Regular rhythm and normal heart sounds.  Tachycardia present.   Pulmonary/Chest: Effort normal. No respiratory distress. She has no wheezes. She has no rales.  Slightly prolonged  expiratory phase  Neurological: She is alert.  Skin: Skin is warm and dry. She is not diaphoretic.  Psychiatric:  Patient is depressed and anxious, appears flat and tearful, reports SI and a suicide attempt recently     Assessment/Plan: Please see individual problem list.  Sinusitis, acute maxillary Patient with sinus congestion and tenderness. Most consistent with sinusitis. Possible bronchitis as well. Discussed treatment with prednisone and antibiotics though she does not have money to afford these at this time. We are sending her to the emergency room for depression and suicide attempt. Possibly they can provide her with treatment there.  Suicide attempt Patient reports significant depression and anxiety that has been building up over a period of time since her significant other died earlier this year. She reports a suicide attempt in the last several days by pill overdose. She is unable to tell me what pills she took. She notes she is unable to say that she is safe on her own outside of the office. She continues to have suicidal ideation though has no current plan at this time. She can't promise that she has no intent at this time. Given this and her suicide attempt we are having EMS transport her to the emergency room for further evaluation and treatment. CMA contacted the ED and informed and the patient was on her way.   [name], MD [place]

## 2016-08-27 NOTE — ED Notes (Signed)
Pt reports she is suicidal.  Pt states she took unknown amount of blood pressure meds 3 days because she was feeling sad.  Pt states SI but no plan.  Pt denies HI    Pt denies etoh or drug use.  Pt calm and cooperative.

## 2016-08-27 NOTE — Progress Notes (Signed)
Pre visit review using our clinic review tool, if applicable. No additional management support is needed unless otherwise documented below in the visit note. 

## 2016-08-27 NOTE — ED Notes (Signed)
Pt complains of pain in lower back and having to wait to be seen by MD, explained delays to pt. Triage RN notified of pts complaints.

## 2016-08-27 NOTE — ED Provider Notes (Signed)
[facility] Emergency Department Provider Note    First MD Initiated Contact with Patient 08/27/16 2345     (approximate)  I have reviewed the triage vital signs and the nursing notes.   HISTORY  Chief Complaint Suicidal   HPI Valerie Vazquez is a 60 y.o. female with below list of chronic medical conditions including schizoaffective disorder presents to the emergency department with suicidal ideation since June of this year when she stated that her friend of hers died. Patient states that she has been taking unknown medications to end her life. Most recent episode of pill ingestion was 3 days ago.   Past Medical History:  Diagnosis Date  . Anemia   . Bilateral renal cysts   . Chronic back pain    "upper and lower" (09/19/2014)  . Depression    "major" (09/19/2014)  . DVT (deep venous thrombosis) ([place])   . Esophageal spasm   . Gallstones   . Gastritis   . Gastroenteritis   . GERD (gastroesophageal reflux disease)   . Headache    "couple /month lately" (09/19/2014)  . High blood pressure   . High cholesterol   . History of blood transfusion 1985   related to hysterectomy  . Hypertension   . IBS (irritable bowel syndrome)   . Migraine    "a few times/yr" (09/19/2014)  . Myocardial infarction 10/2010 X 3   "while hospitalized"  . Osteoarthritis    "qwhere; mostly around my joints" (09/19/2014)  . Pneumonia 04/2014  . PTSD (post-traumatic stress disorder)   . Pulmonary embolism ([place]) 04/2001; 09/19/2014   "after gallbladder OR; "  . Schizoaffective disorder ([place])   . Sleep apnea    "mild" (09/19/2014)  . Snoring    a. sleep study 5/16: No OSA  . Spinal stenosis   . Spondylosis   . Type II diabetes mellitus ([place])    "dx'd in 2007; lost weight; no RX for ~ 4 yr now" (09/19/2014)    Patient Active Problem List   Diagnosis Date Noted  . Sinusitis, acute maxillary 08/27/2016  . Suicide attempt 08/27/2016  . Rash and nonspecific skin  eruption 04/16/2016  . Colitis 04/08/2016  . Tremor 03/19/2016  . Left shoulder pain 02/18/2016  . Abdominal distension 02/18/2016  . Absolute anemia 01/29/2016  . Chronic pain 01/29/2016  . Neurogenic pain 01/29/2016  . Musculoskeletal pain 01/29/2016  . Long term current use of opiate analgesic 01/29/2016  . Long term prescription opiate use 01/29/2016  . Opiate use (15 MME/Day) 01/29/2016  . Encounter for therapeutic drug level monitoring 01/29/2016  . Encounter for pain management planning 01/29/2016  . Chronic neck pain (midline) 01/29/2016  . Chronic chest wall pain 01/29/2016  . Chronic low back pain (Location of Secondary source of pain) (Bilateral) (R>L) 01/29/2016  . Disturbance of skin sensation 01/29/2016  . Chronic shoulder pain (Location of Primary Source of Pain) (Left) 01/29/2016  . Chronic hip pain (Location of Tertiary source of pain) (Bilateral) (R>L) 01/29/2016  . Osteoarthritis of hip (Bilateral) 01/29/2016  . Lumbar facet arthropathy (Severe at L2-3, L3-4, L4-5, and L5-S1) 01/29/2016  . Lumbar central spinal stenosis (moderate at L4-5; mild at L2-3 and L3-4) 01/29/2016  . Lumbar foraminal stenosis (L4-5) (Right) 01/29/2016  . Lumbar facet syndrome 01/29/2016  . Hx of cervical spine surgery 01/29/2016  . Numbness 11/19/2015  . Right wrist pain 11/09/2015  . Breast pain 10/19/2015  . Chest pain 10/14/2015  . Headache 10/14/2015  . Abdominal  pain 10/14/2015  . Lip swelling 10/14/2015  . Pulmonary embolism ([place]) 08/15/2015  . Bilateral pneumonia 08/15/2015  . Nausea 08/15/2015  . Shortness of breath 07/31/2015  . Pleural effusion, bilateral 07/31/2015  . Cough 07/31/2015  . Chronic pain of multiple sites 06/05/2015  . Chronic lumbosacral pain 06/05/2015  . Esophageal spasm 06/05/2015  . History of DVT (deep vein thrombosis) 02/25/2015  . 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 Constipation 12/12/2014  . Coronary vasospasm ([place]) 12/09/2014  . History of pulmonary  embolus (PE) 09/19/2014  . Impingement syndrome of shoulder 08/30/2014  . Impingement syndrome of left shoulder 08/30/2014  . Injury of superior glenoid labrum of shoulder joint 08/16/2014  . Supraspinatus tenosynovitis 08/16/2014  . Cervical spondylosis without myelopathy 08/16/2014  . Anxiety and depression 04/13/2014  . Osteoarthritis of hip (Left) 04/11/2014  . S/P THR (total hip replacement) (Left) 04/11/2014  . IBS (irritable bowel syndrome) 04/11/2014  . GERD (gastroesophageal reflux disease) 04/11/2014  . Insomnia 04/11/2014  . Essential hypertension, benign 10/28/2013  . Left hip pain 10/28/2013  . Anemia 10/28/2013  . HLD (hyperlipidemia) 10/28/2013    Past Surgical History:  Procedure Laterality Date  . ABDOMINAL HYSTERECTOMY  1985  . ANTERIOR CERVICAL DECOMP/DISCECTOMY FUSION  10/2012  . APPENDECTOMY  1985  . BACK SURGERY    . BREAST CYST EXCISION Right   . BREAST EXCISIONAL BIOPSY Right YRS AGO   NEG  . CARDIAC CATHETERIZATION   2000's; 2009  . CHOLECYSTECTOMY  2002  . EXCISION/RELEASE BURSA HIP Right 12/1984  . HERNIA REPAIR  1985  . LEFT HEART CATHETERIZATION WITH CORONARY ANGIOGRAM N/A 12/09/2014   Procedure: LEFT HEART CATHETERIZATION WITH CORONARY ANGIOGRAM;  Surgeon: Burnell Blanks, MD;  Location: [facility] CATH LAB;  Service: Cardiovascular;  Laterality: N/A;  . TONSILLECTOMY AND ADENOIDECTOMY  ~ 1968  . TOTAL HIP ARTHROPLASTY Left 04-06-2014  . UMBILICAL HERNIA REPAIR  1985  . VENA CAVA FILTER PLACEMENT  03/2014    Prior to Admission medications   Medication Sig Start Date End Date Taking? Authorizing Provider  albuterol (PROVENTIL HFA;VENTOLIN HFA) 108 (90 Base) MCG/ACT inhaler Inhale 2 puffs into the lungs every 6 (six) hours as needed for wheezing or shortness of breath. 08/15/16   Nena Polio, MD  apixaban (ELIQUIS) 5 MG TABS tablet Take 1 tablet (5 mg total) by mouth 2 (two) times daily. 08/15/16   Nena Polio, MD  calcium carbonate (TUMS - DOSED  IN MG ELEMENTAL CALCIUM) 500 MG chewable tablet Chew 1 tablet by mouth daily as needed for indigestion or heartburn.    Historical Provider, MD  carvedilol (COREG) 3.125 MG tablet Take 1 tablet (3.125 mg total) by mouth 2 (two) times daily with a meal. 08/15/16   Nena Polio, MD  cyclobenzaprine (FLEXERIL) 5 MG tablet  07/27/16   Historical Provider, MD  dicyclomine (BENTYL) 10 MG capsule TAKE 2 CAPSULES BY MOUTH EVERY SIX HOURS Patient taking differently: TAKE 1 CAPSULES BY MOUTH THREE TIMES DAILY 04/09/15   [name], MD  dicyclomine (BENTYL) 20 MG tablet Take 1 tablet (20 mg total) by mouth 3 (three) times daily as needed for spasms. 04/11/16 04/11/17  Earleen Newport, MD  diltiazem (CARDIZEM CD) 120 MG 24 hr capsule Take 120 mg by mouth daily. 12/26/15   Historical Provider, MD  diltiazem (CARDIZEM SR) 60 MG 12 hr capsule TAKE 1 CAPSULE (60 MG TOTAL) BY MOUTH EVERY 6 (SIX) HOURS AS NEEDED. 06/30/16   Manus Gunning, MD  diphenhydrAMINE (BENADRYL)  25 MG tablet Take 1-2 tablets (25-50 mg total) by mouth every 6 (six) hours as needed for itching (Rash). 07/14/15   Antonietta Breach, PA-C  EPINEPHrine 0.3 mg/0.3 mL IJ SOAJ injection Inject 0.3 mLs (0.3 mg total) into the muscle once. 10/12/15   [name], MD  escitalopram (LEXAPRO) 10 MG tablet Take 1 tablet (10 mg total) by mouth daily. 08/15/16   Nena Polio, MD  furosemide (LASIX) 20 MG tablet TAKE 1 TABLET BY MOUTH EVERY DAY Patient taking differently: TAKE 1 TABLET BY MOUTH EVERY DAY prn 08/14/15   [name], MD  gabapentin (NEURONTIN) 600 MG tablet Take 0.5 tablets (300 mg total) by mouth every 8 (eight) hours. 08/22/16   [name], MD  isosorbide mononitrate (IMDUR) 30 MG 24 hr tablet Take 1 tablet (30 mg total) by mouth daily. 08/15/16   Nena Polio, MD  Multiple Vitamin (MULTIVITAMIN WITH MINERALS) TABS tablet Take 2 tablets by mouth daily.     Historical Provider, MD  mupirocin ointment (BACTROBAN) 2 %  Place 1 application into the nose 3 (three) times daily. 04/16/16   [name], MD  nitrofurantoin, macrocrystal-monohydrate, (MACROBID) 100 MG capsule Take 1 capsule (100 mg total) by mouth 2 (two) times daily. 04/11/16   Earleen Newport, MD  ondansetron (ZOFRAN) 4 MG tablet Take 1 tablet (4 mg total) by mouth every 8 (eight) hours as needed for nausea or vomiting. 08/22/16   [name], MD  oxyCODONE-acetaminophen (PERCOCET/ROXICET) 5-325 MG tablet Take 1 tablet by mouth 2 (two) times daily as needed for severe pain. 02/18/16   [name], MD  pantoprazole (PROTONIX) 40 MG tablet TAKE ONE TABLET BY MOUTH EVERY DAY 09/03/15   [name], MD  potassium chloride (K-DUR) 10 MEQ tablet Take 1 tablet (10 mEq total) by mouth 2 (two) times daily. Patient taking differently: Take 10 mEq by mouth 2 (two) times daily as needed.  10/22/15   Burnell Blanks, MD  traZODone (DESYREL) 50 MG tablet Take 1 tablet (50 mg total) by mouth at bedtime. Patient taking differently: Take 50 mg by mouth at bedtime as needed for sleep.  04/24/15   [name], MD  triamcinolone (NASACORT AQ) 55 MCG/ACT AERO nasal inhaler Place 2 sprays into the nose daily. 10/02/15   Wilhelmina Mcardle, MD    Allergies Abilify [aripiprazole]; Augmentin [amoxicillin-pot clavulanate]; Bactrim [sulfamethoxazole-trimethoprim]; Ceftin [cefuroxime axetil]; Coconut oil; Diclofenac; Dye fdc red [red dye]; Indocin [indomethacin]; Linaclotide; Lisinopril; Morphine; Morphine and related; Simvastatin; Sulfa antibiotics; Sulfamethoxazole-trimethoprim; Wellbutrin [bupropion]; and Quetiapine fumarate  Family History  Problem Relation Age of Onset  . Stroke Mother     Deceased  . Lung cancer Father     Deceased  . Healthy Daughter   . Healthy Son     x 2  . Other Son     Suicide  . Heart attack Neg Hx     Social History Social History  Substance Use Topics  . Smoking status: Former Smoker    Packs/day: 1.50     Years: 10.00    Types: Cigarettes    Quit date: 04/12/1979  . Smokeless tobacco: Never Used  . Alcohol use No    Review of Systems Constitutional: No fever/chills Eyes: No visual changes. ENT: No sore throat. Cardiovascular: Denies chest pain. Respiratory: Denies shortness of breath. Gastrointestinal: No abdominal pain.  No nausea, no vomiting.  No diarrhea.  No constipation. Genitourinary: Negative for dysuria. Musculoskeletal: Negative for back pain.  Skin: Negative for rash. Neurological: Negative for headaches, focal weakness or numbness. Psychiatric:Positive for suicidal ideation  10-point ROS otherwise negative.  ____________________________________________   PHYSICAL EXAM:  VITAL SIGNS: ED Triage Vitals  Enc Vitals Group     BP 08/27/16 1808 114/74     Pulse Rate 08/27/16 1808 (!) 102     Resp 08/27/16 1808 18     Temp 08/27/16 1808 98.6 F (37 C)     Temp Source 08/27/16 1808 Oral     SpO2 08/27/16 1808 100 %     Weight 08/27/16 1747 200 lb (90.7 kg)     Height 08/27/16 1747 5\' 6"  (1.676 m)     Head Circumference --      Peak Flow --      Pain Score 08/27/16 1748 6     Pain Loc --      Pain Edu? --      Excl. in [place]? --     Constitutional: Alert and oriented. Well appearing and in no acute distress. Eyes: Conjunctivae are normal. PERRL. EOMI. Head: Atraumatic. Mouth/Throat: Mucous membranes are moist.  Oropharynx non-erythematous. Neck: No stridor.   Cardiovascular: Normal rate, regular rhythm. Good peripheral circulation. Grossly normal heart sounds. Respiratory: Normal respiratory effort.  No retractions. Lungs CTAB. Gastrointestinal: Soft and nontender. No distention.  Musculoskeletal: No lower extremity tenderness nor edema. No gross deformities of extremities. Neurologic:  Normal speech and language. No gross focal neurologic deficits are appreciated.  Skin:  Skin is warm, dry and intact. No rash noted. Psychiatric: Depressed mood, tearful  ____________________________________________   LABS (all labs ordered are listed, but only abnormal results are displayed)  Labs Reviewed  CBC WITH DIFFERENTIAL/PLATELET - Abnormal; Notable for the following:       Result Value   Basophils Absolute 0.2 (*)    All other components within normal limits  COMPREHENSIVE METABOLIC PANEL - Abnormal; Notable for the following:    Glucose, Bld 118 (*)    Total Protein 8.2 (*)    GFR calc non Af Amer 60 (*)    All other components within normal limits  URINE DRUG SCREEN, QUALITATIVE ([facility] ONLY) - Abnormal; Notable for the following:    Tricyclic, Ur Screen POSITIVE (*)    All other components within normal limits  URINALYSIS, COMPLETE (UACMP) WITH MICROSCOPIC - Abnormal; Notable for the following:    Color, Urine YELLOW (*)    APPearance CLEAR (*)    Protein, ur 30 (*)    Leukocytes, UA TRACE (*)    Squamous Epithelial / LPF 0-5 (*)    All other components within normal limits  ETHANOL     Procedures    INITIAL IMPRESSION / ASSESSMENT AND PLAN / ED COURSE  Pertinent labs & imaging results that were available during my care of the patient were reviewed by me and considered in my medical decision making (see chart for details).  Await psychiatry consultation   Clinical Course     ____________________________________________  FINAL CLINICAL IMPRESSION(S) / ED DIAGNOSES  Final diagnoses:  Suicidal ideation     MEDICATIONS GIVEN DURING THIS VISIT:  Medications - No data to display   NEW OUTPATIENT MEDICATIONS STARTED DURING THIS VISIT:  New Prescriptions   No medications on file    Modified Medications   No medications on file    Discontinued Medications   No medications on file     Note:  This document was prepared using Dragon voice recognition software and may include unintentional  dictation errors.     Gregor Hams, MD 08/28/16 [phone]

## 2016-08-28 ENCOUNTER — Ambulatory Visit: Payer: Self-pay

## 2016-08-28 ENCOUNTER — Inpatient Hospital Stay
Admission: EM | Admit: 2016-08-28 | Discharge: 2016-09-04 | DRG: 885 | Disposition: A | Discharge status: Home / Self Care | Payer: Medicare Other | Source: Intra-hospital | Attending: Psychiatry | Admitting: Psychiatry

## 2016-08-28 DIAGNOSIS — Z590 Homelessness: Secondary | ICD-10-CM

## 2016-08-28 DIAGNOSIS — K219 Gastro-esophageal reflux disease without esophagitis: Secondary | ICD-10-CM | POA: Diagnosis present

## 2016-08-28 DIAGNOSIS — Z90710 Acquired absence of both cervix and uterus: Secondary | ICD-10-CM

## 2016-08-28 DIAGNOSIS — Z79899 Other long term (current) drug therapy: Secondary | ICD-10-CM | POA: Diagnosis not present

## 2016-08-28 DIAGNOSIS — I10 Essential (primary) hypertension: Secondary | ICD-10-CM | POA: Diagnosis present

## 2016-08-28 DIAGNOSIS — G4700 Insomnia, unspecified: Secondary | ICD-10-CM | POA: Diagnosis present

## 2016-08-28 DIAGNOSIS — Z87891 Personal history of nicotine dependence: Secondary | ICD-10-CM | POA: Diagnosis not present

## 2016-08-28 DIAGNOSIS — Z96642 Presence of left artificial hip joint: Secondary | ICD-10-CM | POA: Diagnosis present

## 2016-08-28 DIAGNOSIS — R45851 Suicidal ideations: Secondary | ICD-10-CM | POA: Diagnosis present

## 2016-08-28 DIAGNOSIS — K589 Irritable bowel syndrome without diarrhea: Secondary | ICD-10-CM | POA: Diagnosis present

## 2016-08-28 DIAGNOSIS — Z9049 Acquired absence of other specified parts of digestive tract: Secondary | ICD-10-CM

## 2016-08-28 DIAGNOSIS — F251 Schizoaffective disorder, depressive type: Secondary | ICD-10-CM | POA: Diagnosis not present

## 2016-08-28 DIAGNOSIS — Z981 Arthrodesis status: Secondary | ICD-10-CM

## 2016-08-28 DIAGNOSIS — F329 Major depressive disorder, single episode, unspecified: Secondary | ICD-10-CM | POA: Diagnosis not present

## 2016-08-28 DIAGNOSIS — Z7901 Long term (current) use of anticoagulants: Secondary | ICD-10-CM

## 2016-08-28 DIAGNOSIS — R05 Cough: Secondary | ICD-10-CM | POA: Diagnosis not present

## 2016-08-28 DIAGNOSIS — Z888 Allergy status to other drugs, medicaments and biological substances status: Secondary | ICD-10-CM

## 2016-08-28 DIAGNOSIS — J440 Chronic obstructive pulmonary disease with acute lower respiratory infection: Secondary | ICD-10-CM | POA: Diagnosis present

## 2016-08-28 DIAGNOSIS — J209 Acute bronchitis, unspecified: Secondary | ICD-10-CM | POA: Diagnosis present

## 2016-08-28 DIAGNOSIS — Z5181 Encounter for therapeutic drug level monitoring: Secondary | ICD-10-CM | POA: Diagnosis not present

## 2016-08-28 DIAGNOSIS — Z86718 Personal history of other venous thrombosis and embolism: Secondary | ICD-10-CM | POA: Diagnosis not present

## 2016-08-28 DIAGNOSIS — Z9181 History of falling: Secondary | ICD-10-CM

## 2016-08-28 DIAGNOSIS — I25118 Atherosclerotic heart disease of native coronary artery with other forms of angina pectoris: Secondary | ICD-10-CM | POA: Diagnosis present

## 2016-08-28 DIAGNOSIS — G8929 Other chronic pain: Secondary | ICD-10-CM | POA: Diagnosis present

## 2016-08-28 DIAGNOSIS — Z9102 Food additives allergy status: Secondary | ICD-10-CM

## 2016-08-28 DIAGNOSIS — R509 Fever, unspecified: Secondary | ICD-10-CM

## 2016-08-28 DIAGNOSIS — G629 Polyneuropathy, unspecified: Secondary | ICD-10-CM | POA: Diagnosis present

## 2016-08-28 DIAGNOSIS — Z885 Allergy status to narcotic agent status: Secondary | ICD-10-CM | POA: Diagnosis not present

## 2016-08-28 DIAGNOSIS — I252 Old myocardial infarction: Secondary | ICD-10-CM | POA: Diagnosis not present

## 2016-08-28 DIAGNOSIS — Z883 Allergy status to other anti-infective agents status: Secondary | ICD-10-CM | POA: Diagnosis not present

## 2016-08-28 DIAGNOSIS — M79605 Pain in left leg: Secondary | ICD-10-CM | POA: Diagnosis not present

## 2016-08-28 DIAGNOSIS — E1159 Type 2 diabetes mellitus with other circulatory complications: Secondary | ICD-10-CM | POA: Diagnosis present

## 2016-08-28 DIAGNOSIS — Z881 Allergy status to other antibiotic agents status: Secondary | ICD-10-CM

## 2016-08-28 DIAGNOSIS — M79662 Pain in left lower leg: Secondary | ICD-10-CM | POA: Diagnosis not present

## 2016-08-28 DIAGNOSIS — Z86711 Personal history of pulmonary embolism: Secondary | ICD-10-CM

## 2016-08-28 DIAGNOSIS — M7918 Myalgia, other site: Secondary | ICD-10-CM | POA: Diagnosis present

## 2016-08-28 DIAGNOSIS — Z9119 Patient's noncompliance with other medical treatment and regimen: Secondary | ICD-10-CM

## 2016-08-28 DIAGNOSIS — Z91018 Allergy to other foods: Secondary | ICD-10-CM

## 2016-08-28 DIAGNOSIS — E119 Type 2 diabetes mellitus without complications: Secondary | ICD-10-CM | POA: Diagnosis not present

## 2016-08-28 HISTORY — DX: Schizoaffective disorder, depressive type: F25.1

## 2016-08-28 MED ORDER — GABAPENTIN 600 MG PO TABS
300.0000 mg | ORAL_TABLET | Freq: Three times a day (TID) | ORAL | Status: DC
  Administered 2016-08-28 – 2016-09-04 (×19): 300 mg via ORAL
  Filled 2016-08-28 (×19): qty 1

## 2016-08-28 MED ORDER — HYDROCOD POLST-CPM POLST ER 10-8 MG/5ML PO SUER
ORAL | Status: AC
  Administered 2016-08-28: 5 mL via ORAL
  Filled 2016-08-28: qty 5

## 2016-08-28 MED ORDER — OXYMETAZOLINE HCL 0.05 % NA SOLN
1.0000 | Freq: Once | NASAL | Status: AC
  Administered 2016-08-28: 1 via NASAL
  Filled 2016-08-28: qty 15

## 2016-08-28 MED ORDER — ALUM & MAG HYDROXIDE-SIMETH 200-200-20 MG/5ML PO SUSP: 30.0000 mL | ORAL | Status: DC | PRN

## 2016-08-28 MED ORDER — OLANZAPINE 5 MG PO TABS
5.0000 mg | ORAL_TABLET | Freq: Every day | ORAL | Status: DC
  Administered 2016-08-28 – 2016-08-30 (×3): 5 mg via ORAL
  Filled 2016-08-28 (×3): qty 1

## 2016-08-28 MED ORDER — FLUTICASONE PROPIONATE 50 MCG/ACT NA SUSP
2.0000 | Freq: Every day | NASAL | Status: DC
  Administered 2016-08-29 – 2016-09-03 (×5): 2 via NASAL
  Filled 2016-08-28: qty 16

## 2016-08-28 MED ORDER — OXYCODONE-ACETAMINOPHEN 5-325 MG PO TABS
1.0000 | ORAL_TABLET | Freq: Four times a day (QID) | ORAL | Status: DC | PRN
Start: 2016-08-28 — End: 2016-09-04
  Administered 2016-08-31 – 2016-09-04 (×4): 1 via ORAL
  Filled 2016-08-28 (×6): qty 1

## 2016-08-28 MED ORDER — CARVEDILOL 3.125 MG PO TABS
3.1250 mg | ORAL_TABLET | Freq: Two times a day (BID) | ORAL | Status: DC
  Administered 2016-08-29 – 2016-09-04 (×12): 3.125 mg via ORAL
  Filled 2016-08-28 (×14): qty 1

## 2016-08-28 MED ORDER — ALBUTEROL SULFATE HFA 108 (90 BASE) MCG/ACT IN AERS
2.0000 | INHALATION_SPRAY | RESPIRATORY_TRACT | Status: DC | PRN
  Administered 2016-08-29: 2 via RESPIRATORY_TRACT
  Filled 2016-08-28: qty 6.7

## 2016-08-28 MED ORDER — GUAIFENESIN 100 MG/5ML PO SOLN
15.0000 mL | Freq: Four times a day (QID) | ORAL | Status: DC | PRN
  Administered 2016-08-28 – 2016-09-01 (×7): 300 mg via ORAL
  Filled 2016-08-28 (×11): qty 15

## 2016-08-28 MED ORDER — ACETAMINOPHEN 500 MG PO TABS
1000.0000 mg | ORAL_TABLET | Freq: Once | ORAL | Status: AC
  Administered 2016-08-28: 1000 mg via ORAL
  Filled 2016-08-28: qty 2

## 2016-08-28 MED ORDER — APIXABAN 5 MG PO TABS
5.0000 mg | ORAL_TABLET | Freq: Two times a day (BID) | ORAL | Status: DC
  Administered 2016-08-29 – 2016-09-04 (×13): 5 mg via ORAL
  Filled 2016-08-28 (×14): qty 1

## 2016-08-28 MED ORDER — ESCITALOPRAM OXALATE 10 MG PO TABS
20.0000 mg | ORAL_TABLET | Freq: Every day | ORAL | Status: DC
  Administered 2016-08-29 – 2016-09-04 (×7): 20 mg via ORAL
  Filled 2016-08-28 (×8): qty 2

## 2016-08-28 MED ORDER — CYCLOBENZAPRINE HCL 10 MG PO TABS
10.0000 mg | ORAL_TABLET | Freq: Four times a day (QID) | ORAL | Status: DC | PRN
  Administered 2016-08-28 – 2016-09-03 (×2): 10 mg via ORAL
  Filled 2016-08-28 (×4): qty 1

## 2016-08-28 MED ORDER — HYDROCOD POLST-CPM POLST ER 10-8 MG/5ML PO SUER
5.0000 mL | Freq: Once | ORAL | Status: AC
  Administered 2016-08-28: 5 mL via ORAL

## 2016-08-28 MED ORDER — PANTOPRAZOLE SODIUM 40 MG PO TBEC
40.0000 mg | DELAYED_RELEASE_TABLET | Freq: Every day | ORAL | Status: DC
  Administered 2016-08-29 – 2016-09-04 (×7): 40 mg via ORAL
  Filled 2016-08-28 (×7): qty 1

## 2016-08-28 MED ORDER — TRAZODONE HCL 50 MG PO TABS
50.0000 mg | ORAL_TABLET | Freq: Every day | ORAL | Status: DC
  Administered 2016-08-28: 50 mg via ORAL
  Filled 2016-08-28: qty 1

## 2016-08-28 MED ORDER — POTASSIUM CHLORIDE CRYS ER 20 MEQ PO TBCR
10.0000 meq | EXTENDED_RELEASE_TABLET | Freq: Every day | ORAL | Status: DC
  Administered 2016-08-29 – 2016-09-04 (×7): 10 meq via ORAL
  Filled 2016-08-28 (×7): qty 1

## 2016-08-28 MED ORDER — DICYCLOMINE HCL 20 MG PO TABS
20.0000 mg | ORAL_TABLET | Freq: Three times a day (TID) | ORAL | Status: DC | PRN
Start: 2016-08-28 — End: 2016-09-04
  Administered 2016-08-31 – 2016-09-03 (×3): 20 mg via ORAL
  Filled 2016-08-28 (×6): qty 1

## 2016-08-28 MED ORDER — OXYMETAZOLINE HCL 0.05 % NA SOLN
1.0000 | Freq: Two times a day (BID) | NASAL | Status: DC
  Administered 2016-08-29 – 2016-09-04 (×13): 1 via NASAL
  Filled 2016-08-28 (×3): qty 15

## 2016-08-28 MED ORDER — FUROSEMIDE 20 MG PO TABS
20.0000 mg | ORAL_TABLET | Freq: Every day | ORAL | Status: DC
  Administered 2016-08-29 – 2016-09-04 (×7): 20 mg via ORAL
  Filled 2016-08-28 (×7): qty 1

## 2016-08-28 MED ORDER — DILTIAZEM HCL ER 60 MG PO CP12
60.0000 mg | ORAL_CAPSULE | Freq: Two times a day (BID) | ORAL | Status: DC
  Administered 2016-08-29 – 2016-09-04 (×11): 60 mg via ORAL
  Filled 2016-08-28 (×15): qty 1

## 2016-08-28 MED ORDER — MAGNESIUM HYDROXIDE 400 MG/5ML PO SUSP: 30.0000 mL | Freq: Every day | ORAL | Status: DC | PRN

## 2016-08-28 MED ORDER — ISOSORBIDE MONONITRATE ER 30 MG PO TB24
30.0000 mg | ORAL_TABLET | Freq: Every day | ORAL | Status: DC
  Administered 2016-08-29 – 2016-09-04 (×7): 30 mg via ORAL
  Filled 2016-08-28 (×7): qty 1

## 2016-08-28 MED ORDER — ACETAMINOPHEN 325 MG PO TABS
650.0000 mg | ORAL_TABLET | Freq: Four times a day (QID) | ORAL | Status: DC | PRN
  Administered 2016-08-29: 650 mg via ORAL
  Filled 2016-08-28: qty 2

## 2016-08-28 NOTE — ED Notes (Signed)
Report given to Curly Rim bhu nurse.  Pt still waiting on [facility] consult.  Mallie Mussel RN aware.

## 2016-08-28 NOTE — BH Assessment (Signed)
Assessment Note  Valerie Vazquez is an 60 y.o. female presenting to ED, upon the recommendation of primary care physician, with suicidal ideations, worsening depression and anxiety. Patient reports her current roommate kicked her out as she felt as though the patient was a threat to her and her family given her cough and congestion with no improvement. Patient reports increased depression and feeling like like everything is spiraling around her since her significant other died. She says she moved from [place] to [place] to get away from a "bad situation".    Patient reports thoughts of harming herself on a daily basis for the past several months. She taking as many pills that she could sometime in the last 1-3 days with the intent to harm herself. Patient continues to endorse SI but does contract for safety.  Patient denies HI and any auditory/visual hallucinations.  She denies any drug/alcohol use.  Diagnosis: Major Depression  Past Medical History:  Past Medical History:  Diagnosis Date  . Anemia   . Bilateral renal cysts   . Chronic back pain    "upper and lower" (09/19/2014)  . Depression    "major" (09/19/2014)  . DVT (deep venous thrombosis) ([place])   . Esophageal spasm   . Gallstones   . Gastritis   . Gastroenteritis   . GERD (gastroesophageal reflux disease)   . Headache    "couple /month lately" (09/19/2014)  . High blood pressure   . High cholesterol   . History of blood transfusion 1985   related to hysterectomy  . Hypertension   . IBS (irritable bowel syndrome)   . Migraine    "a few times/yr" (09/19/2014)  . Myocardial infarction 10/2010 X 3   "while hospitalized"  . Osteoarthritis    "qwhere; mostly around my joints" (09/19/2014)  . Pneumonia 04/2014  . PTSD (post-traumatic stress disorder)   . Pulmonary embolism ([place]) 04/2001; 09/19/2014   "after gallbladder OR; "  . Schizoaffective disorder ([place])   . Sleep apnea    "mild" (09/19/2014)  . Snoring    a.  sleep study 5/16: No OSA  . Spinal stenosis   . Spondylosis   . Type II diabetes mellitus ([place])    "dx'd in 2007; lost weight; no RX for ~ 4 yr now" (09/19/2014)    Past Surgical History:  Procedure Laterality Date  . ABDOMINAL HYSTERECTOMY  1985  . ANTERIOR CERVICAL DECOMP/DISCECTOMY FUSION  10/2012  . APPENDECTOMY  1985  . BACK SURGERY    . BREAST CYST EXCISION Right   . BREAST EXCISIONAL BIOPSY Right YRS AGO   NEG  . CARDIAC CATHETERIZATION   2000's; 2009  . CHOLECYSTECTOMY  2002  . EXCISION/RELEASE BURSA HIP Right 12/1984  . HERNIA REPAIR  1985  . LEFT HEART CATHETERIZATION WITH CORONARY ANGIOGRAM N/A 12/09/2014   Procedure: LEFT HEART CATHETERIZATION WITH CORONARY ANGIOGRAM;  Surgeon: Burnell Blanks, MD;  [facility] C[facility] LAB;  Service: Cardiovascular;  Laterality: N/A;  . TONSILLECTOMY AND ADENOIDECTOMY  ~ 1968  . TOTAL HIP ARTHROPLASTY Left 04-06-2014  . UMBILICAL HERNIA REPAIR  1985  . VENA CAVA FILTER PLACEMENT  03/2014    Family History:  Family History  Problem Relation Age of Onset  . Stroke Mother     Deceased  . Lung cancer Father     Deceased  . Healthy Daughter   . Healthy Son     x 2  . Other Son     Suicide  . Heart attack  Neg Hx     Social History:  reports that she quit smoking about 37 years ago. Her smoking use included Cigarettes. She has a 15.00 pack-year smoking history. She has never used smokeless tobacco. She reports that she does not drink alcohol or use drugs.  Additional Social History:  Alcohol / Drug Use Pain Medications: See PTA Prescriptions: See PTA Over the Counter: See PTA History of alcohol / drug use?: No history of alcohol / drug abuse  CIWA: CIWA-Ar BP: 114/74 Pulse Rate: (!) 102 COWS:    Allergies:  Allergies  Allergen Reactions  . Abilify [Aripiprazole] Other (See Comments)    Jerking movement, slow motor skills  . Augmentin [Amoxicillin-Pot Clavulanate] Diarrhea and Nausea And Vomiting  . Bactrim  [Sulfamethoxazole-Trimethoprim] Diarrhea  . Ceftin [Cefuroxime Axetil] Diarrhea  . Coconut Oil     Rash   . Diclofenac Other (See Comments)     Muscle Pain  . Dye Fdc Red [Red Dye]     "hair dye"  . Indocin [Indomethacin] Other (See Comments)    Migraines   . Linaclotide Diarrhea  . Lisinopril Diarrhea and Other (See Comments)    Other reaction(s): Dizziness, Vomiting  . Morphine Other (See Comments)    Chest Tightness  . Morphine And Related Other (See Comments)    Chest tightness   . Simvastatin Other (See Comments)    Muscle Pain  . Sulfa Antibiotics Diarrhea  . Sulfamethoxazole-Trimethoprim Diarrhea  . Wellbutrin [Bupropion] Other (See Comments)    Jerking movement Slurred speech  . Quetiapine Fumarate Palpitations    Home Medications:  (Not in a hospital admission)  OB/GYN Status:  No LMP recorded. Patient has had a hysterectomy.  General Assessment Data Location of Assessment: [facility] ED TTS Assessment: In system Is this a Tele or Face-to-Face Assessment?: Face-to-Face Is this an Initial Assessment or a Re-assessment for this encounter?: Initial Assessment Marital status: Single Maiden name: Dimas Millin Is patient pregnant?: No Pregnancy Status: No Living Arrangements: Alone Can pt return to current living arrangement?: Yes Admission Status: Voluntary Is patient capable of signing voluntary admission?: Yes Referral Source: Self/Family/Friend Insurance type: Medicare  Medical Screening Exam ([place]) Medical Exam completed: Yes  Crisis Care Plan Living Arrangements: Alone Legal Guardian: Other: (self) Name of Psychiatrist: None reported Name of Therapist: None reported  Education Status Is patient currently in school?: No Current Grade: na Highest grade of school patient has completed: 12 Name of school: na Contact person: na  Risk to self with the past 6 months Suicidal Ideation: Yes-Currently Present Has patient been a risk to self within the  past 6 months prior to admission? : Yes Suicidal Intent: Yes-Currently Present Has patient had any suicidal intent within the past 6 months prior to admission? : Yes Is patient at risk for suicide?: Yes Suicidal Plan?: Yes-Currently Present Has patient had any suicidal plan within the past 6 months prior to admission? : Yes Specify Current Suicidal Plan: Pt reports taking more than the prescribed amount of meds 3 days ago. Access to Means: Yes Specify Access to Suicidal Means: Pt has access to prescription medications What has been your use of drugs/alcohol within the last 12 months?: Patient denies drug/alcohol use. Previous Attempts/Gestures: Yes How many times?: 1 Other Self Harm Risks: None identified Triggers for Past Attempts: Other personal contacts Intentional Self Injurious Behavior: None Family Suicide History: No Recent stressful life event(s): Conflict (Comment), Loss (Comment) (conflict with roommate, death of close friend) Persecutory voices/beliefs?: Yes Depression: Yes Depression  Symptoms: Despondent, Isolating, Fatigue, Loss of interest in usual pleasures, Feeling worthless/self pity Substance abuse history and/or treatment for substance abuse?: No Suicide prevention information given to non-admitted patients: Not applicable  Risk to Others within the past 6 months Homicidal Ideation: No Does patient have any lifetime risk of violence toward others beyond the six months prior to admission? : No Thoughts of Harm to Others: No Current Homicidal Intent: No Current Homicidal Plan: No Access to Homicidal Means: No Identified Victim: None identified History of harm to others?: No Assessment of Violence: None Noted Violent Behavior Description: None identified Does patient have access to weapons?: No Criminal Charges Pending?: No Does patient have a court date: No Is patient on probation?: No  Psychosis Hallucinations: None noted Delusions: None noted  Mental  Status Report Appearance/Hygiene: In scrubs Eye Contact: Fair Motor Activity: Freedom of movement Speech: Soft, Slow Level of Consciousness: Alert Mood: Depressed, Sad Affect: Appropriate to circumstance, Depressed, Sad Anxiety Level: Minimal Thought Processes: Coherent, Relevant Judgement: Unimpaired Orientation: Person, Place, Time, Situation Obsessive Compulsive Thoughts/Behaviors: None  Cognitive Functioning Concentration: Normal Memory: Recent Intact, Remote Intact IQ: Average Insight: Fair Impulse Control: Fair Appetite: Fair Weight Loss: 0 Weight Gain: 0 Sleep: Decreased Vegetative Symptoms: None  ADLScreening [facility]) Patient's cognitive ability adequate to safely complete daily activities?: Yes Patient able to express need for assistance with ADLs?: Yes Independently performs ADLs?: Yes (appropriate for developmental age)  Prior Inpatient Therapy Prior Inpatient Therapy: Yes Prior Therapy Dates: 2014 Prior Therapy Facilty/Provider(s): [place] Reason for Treatment: depression  Prior Outpatient Therapy Prior Outpatient Therapy: No Prior Therapy Dates: na Prior Therapy Facilty/Provider(s): na Reason for Treatment: na Does patient have an ACCT team?: No Does patient have Intensive In-House Services?  : No Does patient have [facility] services? : No Does patient have P4CC services?: No  ADL Screening (condition at time of admission) Patient's cognitive ability adequate to safely complete daily activities?: Yes Patient able to express need for assistance with ADLs?: Yes Independently performs ADLs?: Yes (appropriate for developmental age)       Abuse/Neglect Assessment (Assessment to be complete while patient is alone) Physical Abuse: Denies Verbal Abuse: Denies Sexual Abuse: Denies Exploitation of patient/patient's resources: Denies Self-Neglect: Denies Values / Beliefs Cultural Requests During Hospitalization: None Spiritual Requests  During Hospitalization: None Consults Spiritual Care Consult Needed: No Social Work Consult Needed: No      Additional Information 1:1 In Past 12 Months?: No CIRT Risk: No Elopement Risk: No Does patient have medical clearance?: Yes     Disposition:  Disposition Initial Assessment Completed for this Encounter: Yes Disposition of Patient: Other dispositions Other disposition(s): Other (Comment) (Pending [facility] consult)  On Site Evaluation by:   Reviewed with Physician:    Oneita Hurt 08/28/2016 5:47 AM

## 2016-08-28 NOTE — [facility] Suicide Risk Assessment (Signed)
[facility] Admission Suicide Risk Assessment   Nursing information obtained from:   review of nursing notes and old chart. Discussion with nursing staff. Demographic factors:   living alone possibly homeless. Minimal support. Current Mental Status:   depressed, withdrawn, reporting active suicidal thoughts Loss Factors:   subacute suffering from grief at loss of significant other. Acutely from loss of place to live Historical Factors:   chronic mental illness with a past history of suicide attempts Risk Reduction Factors:   familiarity with appropriate treatment  Total Time spent with patient: 1 hour Principal Problem: Schizoaffective depressive Diagnosis:   Patient Active Problem List   Diagnosis Date Noted  . Schizoaffective disorder ([place]) [F25.9] 08/28/2016  . Sinusitis, acute maxillary [J01.00] 08/27/2016  . Suicide attempt [T14.91XA] 08/27/2016  . Rash and nonspecific skin eruption [R21] 04/16/2016  . Colitis [K52.9] 04/08/2016  . Tremor [R25.1] 03/19/2016  . Left shoulder pain [M25.512] 02/18/2016  . Abdominal distension [R14.0] 02/18/2016  . Absolute anemia [D64.9] 01/29/2016  . Chronic pain [G89.29] 01/29/2016  . Neurogenic pain [M79.2] 01/29/2016  . Musculoskeletal pain [M79.1] 01/29/2016  . Long term current use of opiate analgesic [Z79.891] 01/29/2016  . Long term prescription opiate use [Z79.891] 01/29/2016  . Opiate use (15 MME/Day) [F11.90] 01/29/2016  . Encounter for therapeutic drug level monitoring [Z51.81] 01/29/2016  . Encounter for pain management planning [Z01.89] 01/29/2016  . Chronic neck pain (midline) [M54.2, G89.29] 01/29/2016  . Chronic chest wall pain [R07.89, G89.29] 01/29/2016  . Chronic low back pain (Location of Secondary source of pain) (Bilateral) (R>L) [M54.5, G89.29] 01/29/2016  . Disturbance of skin sensation [R20.9] 01/29/2016  . Chronic shoulder pain (Location of Primary Source of Pain) (Left) [M25.512, G89.29] 01/29/2016  . Chronic hip pain  (Location of Tertiary source of pain) (Bilateral) (R>L) [M25.559, G89.29] 01/29/2016  . Osteoarthritis of hip (Bilateral) [M16.0] 01/29/2016  . Lumbar facet arthropathy (Severe at L2-3, L3-4, L4-5, and L5-S1) [M12.88] 01/29/2016  . Lumbar central spinal stenosis (moderate at L4-5; mild at L2-3 and L3-4) [M48.061] 01/29/2016  . Lumbar foraminal stenosis (L4-5) (Right) [M99.83] 01/29/2016  . Lumbar facet syndrome [M12.88] 01/29/2016  . Hx of cervical spine surgery [Z98.890] 01/29/2016  . Numbness [R20.0] 11/19/2015  . Right wrist pain [M25.531] 11/09/2015  . Breast pain [N64.4] 10/19/2015  . Chest pain [R07.9] 10/14/2015  . Headache [R51] 10/14/2015  . Abdominal pain [R10.9] 10/14/2015  . Lip swelling [R22.0] 10/14/2015  . Pulmonary embolism ([place]) [I26.99] 08/15/2015  . Bilateral pneumonia [J18.9] 08/15/2015  . Nausea [R11.0] 08/15/2015  . Shortness of breath [R06.02] 07/31/2015  . Pleural effusion, bilateral [J90] 07/31/2015  . Cough [R05] 07/31/2015  . Chronic pain of multiple sites [R52, G89.29] 06/05/2015  . Chronic lumbosacral pain [M54.5, G89.29] 06/05/2015  . Esophageal spasm [K22.4] 06/05/2015  . History of DVT (deep vein thrombosis) [Z86.718] 02/25/2015  . Hypercementosis [K03.4] 02/13/2015  . Constipation [K59.00] 12/12/2014  . Coronary vasospasm ([place]) [I20.1] 12/09/2014  . History of pulmonary embolus (PE) [Z86.711] 09/19/2014  . Impingement syndrome of shoulder [M75.40] 08/30/2014  . Impingement syndrome of left shoulder [M75.42] 08/30/2014  . Injury of superior glenoid labrum of shoulder joint [S43.439A] 08/16/2014  . Supraspinatus tenosynovitis [M65.819] 08/16/2014  . Cervical spondylosis without myelopathy [M47.812] 08/16/2014  . Anxiety and depression [F41.8] 04/13/2014  . Osteoarthritis of hip (Left) [M16.12] 04/11/2014  . S/P THR (total hip replacement) (Left) [id number] 04/11/2014  . IBS (irritable bowel syndrome) [K58.9] 04/11/2014  . GERD (gastroesophageal  reflux disease) [K21.9] 04/11/2014  . Insomnia [G47.00]  04/11/2014  . Essential hypertension, benign [I10] 10/28/2013  . Left hip pain [M25.552] 10/28/2013  . Anemia [D64.9] 10/28/2013  . HLD (hyperlipidemia) [E78.5] 10/28/2013   Subjective Data: Patient reports she has active suicidal ideation that has been present for several months but got much worse. Has thoughts about overdosing. Mood is depressed and down negative all the time. Says she is having hallucinations. Says her current medications do not appear to be working and she is feeling more hopeless.  Continued Clinical Symptoms:  Alcohol Use Disorder Identification Test Final Score (AUDIT): 1 The "Alcohol Use Disorders Identification Test", Guidelines for Use in Primary Care, Second Edition.  World Pharmacologist [facility]). Score between 0-7:  no or low risk or alcohol related problems. Score between 8-15:  moderate risk of alcohol related problems. Score between 16-19:  high risk of alcohol related problems. Score 20 or above:  warrants further diagnostic evaluation for alcohol dependence and treatment.   CLINICAL FACTORS:   Depression:   Anhedonia Hopelessness Severe   Musculoskeletal: Strength & Muscle Tone: within normal limits Gait & Station: normal Patient leans: N/A  Psychiatric Specialty Exam: Physical Exam  Constitutional: She appears well-developed. She appears distressed.  HENT:  Head: Normocephalic and atraumatic.  Eyes: Conjunctivae are normal. Pupils are equal, round, and reactive to light.  Neck: Normal range of motion.  Cardiovascular: Normal heart sounds.   Respiratory: Effort normal.  GI: Soft.  Musculoskeletal: Normal range of motion.  Neurological: She is alert.  Skin: Skin is warm and dry.  Psychiatric: Her affect is blunt. Her speech is delayed. She is slowed, withdrawn and actively hallucinating. She expresses impulsivity. She exhibits a depressed mood. She expresses suicidal ideation. She  exhibits abnormal recent memory.    Review of Systems  Constitutional: Positive for malaise/fatigue.  HENT: Negative.   Eyes: Negative.   Respiratory: Positive for cough.   Cardiovascular: Negative.   Gastrointestinal: Negative.   Musculoskeletal: Negative.   Skin: Negative.   Neurological: Positive for weakness.  Psychiatric/Behavioral: Positive for depression, hallucinations and suicidal ideas. Negative for memory loss and substance abuse. The patient is nervous/anxious. The patient does not have insomnia.     Blood pressure 122/71, pulse 82, temperature 98.6 F (37 C), temperature source Oral, resp. rate 20, height 5\' 6"  (1.676 m), weight 86.2 kg (190 lb), SpO2 96 %.Body mass index is 30.67 kg/m.  General Appearance: Casual  Eye Contact:  Minimal  Speech:  Slow  Volume:  Decreased  Mood:  Depressed  Affect:  Congruent  Thought Process:  Goal Directed  Orientation:  Full (Time, Place, and Person)  Thought Content:  Logical and Hallucinations: Auditory  Suicidal Thoughts:  Yes.  with intent/plan  Homicidal Thoughts:  No  Memory:  Immediate;   Good Recent;   Fair Remote;   Fair  Judgement:  Fair  Insight:  Fair  Psychomotor Activity:  Decreased  Concentration:  Concentration: Fair  Recall:  [employer] of Knowledge:  Fair  Language:  Fair  Akathisia:  No  Handed:  Right  AIMS (if indicated):     Assets:  Communication Skills Desire for Improvement Resilience  ADL's:  Intact  Cognition:  Impaired,  Mild  Sleep:         COGNITIVE FEATURES THAT CONTRIBUTE TO RISK:  Closed-mindedness    SUICIDE RISK:   Mild:  Suicidal ideation of limited frequency, intensity, duration, and specificity.  There are no identifiable plans, no associated intent, mild dysphoria and related symptoms, good self-control (both  objective and subjective assessment), few other risk factors, and identifiable protective factors, including available and accessible social support.   PLAN OF CARE:  Patient is being admitted to the psychiatric ward. 15 minute checks. Adjustment of psychiatric medicine and regular reassessment before discharge  I certify that inpatient services furnished can reasonably be expected to improve the patient's condition.  Alethia Berthold, MD 08/28/2016, 4:55 PM

## 2016-08-28 NOTE — ED Notes (Signed)
SOC MD called to get more information on the Pt.

## 2016-08-28 NOTE — Progress Notes (Signed)
Patient ID: Valerie Vazquez, female   DOB: 03-30-1956, 60 y.o.   MRN: [id number] PER STATE REGULATIONS 482.30  THIS CHART WAS REVIEWED FOR MEDICAL NECESSITY WITH RESPECT TO THE PATIENT'S ADMISSION/DURATION OF STAY.  NEXT REVIEW DATE:09/01/16  Roma Schanz, RN, BSN CASE MANAGER

## 2016-08-28 NOTE — Tx Team (Signed)
Initial Treatment Plan 08/28/2016 4:58 PM [name] TO:[id number]    PATIENT STRESSORS: Health problems Medication change or noncompliance   PATIENT STRENGTHS: Average or above average intelligence Capable of independent living Communication skills   PATIENT IDENTIFIED PROBLEMS: Depression     Suicidal ideas                  DISCHARGE CRITERIA:  Ability to meet basic life and health needs Adequate post-discharge living arrangements Improved stabilization in mood, thinking, and/or behavior  PRELIMINARY DISCHARGE PLAN: Attend aftercare/continuing care group Return to previous living arrangement  PATIENT/FAMILY INVOLVEMENT: This treatment plan has been presented to and reviewed with the patient, Valerie Vazquez, and/or family member, .  The patient and family have been given the opportunity to ask questions and make suggestions.  Raul Del, RN 08/28/2016, 4:58 PM

## 2016-08-28 NOTE — ED Notes (Signed)
Pt moved out to a recliner to rest due to having back problems and hip replacements and she is unable to rest

## 2016-08-28 NOTE — ED Notes (Signed)
Patient assigned to appropriate care area. Patient oriented to unit/care area: Informed that, for their safety, care areas are designed for safety and monitored by security cameras at all times; and visiting hours explained to patient. Patient verbalizes understanding, and verbal contract for safety obtained. 

## 2016-08-28 NOTE — ED Notes (Signed)
Pt states that she can not lay down because she feels like she is going to choke on her own sputum. Pt is actively spiting in a cup thick frothy sputum. According to previous nurse ED MD is aware and Pt is cleared to be in the [facility].

## 2016-08-28 NOTE — ED Notes (Signed)
Pt ambulated to the bathroom to void. Pt returned to her bed without difficulty.

## 2016-08-28 NOTE — ED Notes (Signed)
Pt completed her [facility] consult and returned to her room.

## 2016-08-28 NOTE — ED Notes (Signed)
Pt to be admitted to [facility] under ivc at [facility]

## 2016-08-28 NOTE — Progress Notes (Signed)
Patient with depressed affect, cooperative behavior with meals, meds and plan of care. Patient with chronic SI, states she will not hurt self in hospital. Skin check with no contraband found. Skin check with no wounds or bruises found. Dinner tray ordered. PO fluids encouraged. Toiletries provided and safety maintained.

## 2016-08-28 NOTE — ED Notes (Signed)
Pt ate poorly for breakfast but ate all her lunch ,pt offered a shower but she did not feel wel enough to take one today she has cold symptoms and her hips hurt her due to our beds

## 2016-08-28 NOTE — ED Notes (Signed)
Pt was moved temporarily to a room in CDU to have her [facility] consult done. Pt is being escorted by Fritzi Mandes.

## 2016-08-28 NOTE — H&P (Signed)
Psychiatric Admission Assessment Adult  Patient Identification: Valerie Vazquez MRN:  [id number] Date of Evaluation:  08/28/2016 Chief Complaint:  Shizoaffective disorder Principal Diagnosis: Schizoaffective disorder ([place]) Diagnosis:   Patient Active Problem List   Diagnosis Date Noted  . Schizoaffective disorder ([place]) [F25.9] 08/28/2016  . Sinusitis, acute maxillary [J01.00] 08/27/2016  . Suicide attempt [T14.91XA] 08/27/2016  . Rash and nonspecific skin eruption [R21] 04/16/2016  . Colitis [K52.9] 04/08/2016  . Tremor [R25.1] 03/19/2016  . Left shoulder pain [M25.512] 02/18/2016  . Abdominal distension [R14.0] 02/18/2016  . Absolute anemia [D64.9] 01/29/2016  . Chronic pain [G89.29] 01/29/2016  . Neurogenic pain [M79.2] 01/29/2016  . Musculoskeletal pain [M79.1] 01/29/2016  . Long term current use of opiate analgesic [Z79.891] 01/29/2016  . Long term prescription opiate use [Z79.891] 01/29/2016  . Opiate use (15 MME/Day) [F11.90] 01/29/2016  . Encounter for therapeutic drug level monitoring [Z51.81] 01/29/2016  . Encounter for pain management planning [Z01.89] 01/29/2016  . Chronic neck pain (midline) [M54.2, G89.29] 01/29/2016  . Chronic chest wall pain [R07.89, G89.29] 01/29/2016  . Chronic low back pain (Location of Secondary source of pain) (Bilateral) (R>L) [M54.5, G89.29] 01/29/2016  . Disturbance of skin sensation [R20.9] 01/29/2016  . Chronic shoulder pain (Location of Primary Source of Pain) (Left) [M25.512, G89.29] 01/29/2016  . Chronic hip pain (Location of Tertiary source of pain) (Bilateral) (R>L) [M25.559, G89.29] 01/29/2016  . Osteoarthritis of hip (Bilateral) [M16.0] 01/29/2016  . Lumbar facet arthropathy (Severe at L2-3, L3-4, L4-5, and L5-S1) [M12.88] 01/29/2016  . Lumbar central spinal stenosis (moderate at L4-5; mild at L2-3 and L3-4) [M48.061] 01/29/2016  . Lumbar foraminal stenosis (L4-5) (Right) [M99.83] 01/29/2016  . Lumbar facet syndrome [M12.88]  01/29/2016  . Hx of cervical spine surgery [Z98.890] 01/29/2016  . Numbness [R20.0] 11/19/2015  . Right wrist pain [M25.531] 11/09/2015  . Breast pain [N64.4] 10/19/2015  . Chest pain [R07.9] 10/14/2015  . Headache [R51] 10/14/2015  . Abdominal pain [R10.9] 10/14/2015  . Lip swelling [R22.0] 10/14/2015  . Pulmonary embolism ([place]) [I26.99] 08/15/2015  . Bilateral pneumonia [J18.9] 08/15/2015  . Nausea [R11.0] 08/15/2015  . Shortness of breath [R06.02] 07/31/2015  . Pleural effusion, bilateral [J90] 07/31/2015  . Cough [R05] 07/31/2015  . Chronic pain of multiple sites [R52, G89.29] 06/05/2015  . Chronic lumbosacral pain [M54.5, G89.29] 06/05/2015  . Esophageal spasm [K22.4] 06/05/2015  . History of DVT (deep vein thrombosis) [Z86.718] 02/25/2015  . Hypercementosis [K03.4] 02/13/2015  . Constipation [K59.00] 12/12/2014  . Coronary vasospasm ([place]) [I20.1] 12/09/2014  . History of pulmonary embolus (PE) [Z86.711] 09/19/2014  . Impingement syndrome of shoulder [M75.40] 08/30/2014  . Impingement syndrome of left shoulder [M75.42] 08/30/2014  . Injury of superior glenoid labrum of shoulder joint [S43.439A] 08/16/2014  . Supraspinatus tenosynovitis [M65.819] 08/16/2014  . Cervical spondylosis without myelopathy [M47.812] 08/16/2014  . Anxiety and depression [F41.8] 04/13/2014  . Osteoarthritis of hip (Left) [M16.12] 04/11/2014  . S/P THR (total hip replacement) (Left) [J67.341] 04/11/2014  . IBS (irritable bowel syndrome) [K58.9] 04/11/2014  . GERD (gastroesophageal reflux disease) [K21.9] 04/11/2014  . Insomnia [G47.00] 04/11/2014  . Essential hypertension, benign [I10] 10/28/2013  . Left hip pain [M25.552] 10/28/2013  . Anemia [D64.9] 10/28/2013  . HLD (hyperlipidemia) [E78.5] 10/28/2013   History of Present Illness: 60 year old woman presented to the emergency room referred by her primary care doctor. Patient reports severe depression that has been going on since the summer of this  year. Mood feels sad and depressed all the time. Feels increasingly hopeless. Has  no feeling of anything she enjoys in her life. Fatigued all the time. Physically feels sick a lot of the time. She says she is having hallucinations which are chronic but have been worse recently. Says that she has been compliant with her psychiatric medicine which currently just is Lexapro. Hasn't been helping. Major stress came from the death of her significant other this summer. More acutely she is being thrown out of the home she has been living in.   Associated Signs/Symptoms: Depression Symptoms:  depressed mood, anhedonia, psychomotor retardation, fatigue, feelings of worthlessness/guilt, hopelessness, suicidal thoughts with specific plan, (Hypo) Manic Symptoms:  None Anxiety Symptoms:  Excessive Worry, Psychotic Symptoms:  Hallucinations: Auditory PTSD Symptoms: Negative Total Time spent with patient: 1 hour  Past Psychiatric History: patient has a history of a diagnosis of schizoaffective disorder. Last psychiatric hospitalization she recalls was in 2014. That was in [place]. She says she has tried to kill her self more than once in the past. Current medication has only been Lexapro. She says she has been on multiple antipsychotics in the past as well as antidepressants. She has several allergies listed. She cannot recall any medicine that she definitely thinks was helpful in the past.  Is the patient at risk to self? Yes.    Has the patient been a risk to self in the past 6 months? Yes.    Has the patient been a risk to self within the distant past? Yes.    Is the patient a risk to others? No.  Has the patient been a risk to others in the past 6 months? No.  Has the patient been a risk to others within the distant past? No.   Prior Inpatient Therapy:  no records in our computer but she says she has had hospitalizations the last one was in [place]. Prior Outpatient Therapy:  currently she says she  is just getting treatment from her primary care doctor.  Alcohol Screening: 1. How often do you have a drink containing alcohol?: Monthly or less 2. How many drinks containing alcohol do you have on a typical day when you are drinking?: 1 or 2 3. How often do you have six or more drinks on one occasion?: Never Preliminary Score: 0 9. Have you or someone else been injured as a result of your drinking?: No 10. Has a relative or friend or a doctor or another health worker been concerned about your drinking or suggested you cut down?: No Alcohol Use Disorder Identification Test Final Score (AUDIT): 1 Brief Intervention: Patient declined brief intervention Substance Abuse History in the last 12 months:  No. Consequences of Substance Abuse: Negative Previous Psychotropic Medications: Yes  Psychological Evaluations: Yes  Past Medical History:  Past Medical History:  Diagnosis Date  . Anemia   . Bilateral renal cysts   . Chronic back pain    "upper and lower" (09/19/2014)  . Depression    "major" (09/19/2014)  . DVT (deep venous thrombosis) ([place])   . Esophageal spasm   . Gallstones   . Gastritis   . Gastroenteritis   . GERD (gastroesophageal reflux disease)   . Headache    "couple /month lately" (09/19/2014)  . High blood pressure   . High cholesterol   . History of blood transfusion 1985   related to hysterectomy  . Hypertension   . IBS (irritable bowel syndrome)   . Migraine    "a few times/yr" (09/19/2014)  . Myocardial infarction 10/2010 X 3   "while hospitalized"  .  Osteoarthritis    "qwhere; mostly around my joints" (09/19/2014)  . Pneumonia 04/2014  . PTSD (post-traumatic stress disorder)   . Pulmonary embolism ([place]) 04/2001; 09/19/2014   "after gallbladder OR; "  . Schizoaffective disorder ([place])   . Sleep apnea    "mild" (09/19/2014)  . Snoring    a. sleep study 5/16: No OSA  . Spinal stenosis   . Spondylosis   . Type II diabetes mellitus ([place])    "dx'd in 2007; lost  weight; no RX for ~ 4 yr now" (09/19/2014)    Past Surgical History:  Procedure Laterality Date  . ABDOMINAL HYSTERECTOMY  1985  . ANTERIOR CERVICAL DECOMP/DISCECTOMY FUSION  10/2012  . APPENDECTOMY  1985  . BACK SURGERY    . BREAST CYST EXCISION Right   . BREAST EXCISIONAL BIOPSY Right YRS AGO   NEG  . CARDIAC CATHETERIZATION   2000's; 2009  . CHOLECYSTECTOMY  2002  . EXCISION/RELEASE BURSA HIP Right 12/1984  . HERNIA REPAIR  1985  . LEFT HEART CATHETERIZATION WITH CORONARY ANGIOGRAM N/A 12/09/2014   Procedure: LEFT HEART CATHETERIZATION WITH CORONARY ANGIOGRAM;  Surgeon: Burnell Blanks, MD;  Location: [facility] CATH LAB;  Service: Cardiovascular;  Laterality: N/A;  . TONSILLECTOMY AND ADENOIDECTOMY  ~ 1968  . TOTAL HIP ARTHROPLASTY Left 04-06-2014  . UMBILICAL HERNIA REPAIR  1985  . VENA CAVA FILTER PLACEMENT  03/2014   Family History:  Family History  Problem Relation Age of Onset  . Stroke Mother     Deceased  . Lung cancer Father     Deceased  . Healthy Daughter   . Healthy Son     x 2  . Other Son     Suicide  . Heart attack Neg Hx    Family Psychiatric  History: 2 sons both have psychotic disorders Tobacco Screening:  not smoking Social History:  History  Alcohol Use No     History  Drug Use No    Additional Social History:patient has been living with some friends. She says they're putting her out of the house and she has no place to live. Has no family she trusts or gets along with. Had been trying she says to move to [place] a while ago for reasons that are somewhat vague.                           Allergies:   Allergies  Allergen Reactions  . Abilify [Aripiprazole] Other (See Comments)    Jerking movement, slow motor skills  . Augmentin [Amoxicillin-Pot Clavulanate] Diarrhea and Nausea And Vomiting  . Bactrim [Sulfamethoxazole-Trimethoprim] Diarrhea  . Ceftin [Cefuroxime Axetil] Diarrhea  . Coconut Oil     Rash   . Diclofenac Other (See  Comments)     Muscle Pain  . Dye Fdc Red [Red Dye]     "hair dye"  . Indocin [Indomethacin] Other (See Comments)    Migraines   . Linaclotide Diarrhea  . Lisinopril Diarrhea and Other (See Comments)    Other reaction(s): Dizziness, Vomiting  . Morphine Other (See Comments)    Chest Tightness  . Morphine And Related Other (See Comments)    Chest tightness   . Simvastatin Other (See Comments)    Muscle Pain  . Sulfa Antibiotics Diarrhea  . Sulfamethoxazole-Trimethoprim Diarrhea  . Wellbutrin [Bupropion] Other (See Comments)    Jerking movement Slurred speech  . Quetiapine Fumarate Palpitations   Lab Results:  Results for orders placed  or performed during the hospital encounter of 08/27/16 (from the past 48 hour(s))  CBC with Differential     Status: Abnormal   Collection Time: 08/27/16  6:06 PM  Result Value Ref Range   WBC 7.6 3.6 - 11.0 K/uL   RBC 4.15 3.80 - 5.20 MIL/uL   Hemoglobin 12.7 12.0 - 16.0 g/dL   HCT 38.1 35.0 - 47.0 %   MCV 91.9 80.0 - 100.0 fL   MCH 30.7 26.0 - 34.0 pg   MCHC 33.4 32.0 - 36.0 g/dL   RDW 14.1 11.5 - 14.5 %   Platelets 408 150 - 440 K/uL   Neutrophils Relative % 75 %   Neutro Abs 5.6 1.4 - 6.5 K/uL   Lymphocytes Relative 15 %   Lymphs Abs 1.1 1.0 - 3.6 K/uL   Monocytes Relative 6 %   Monocytes Absolute 0.5 0.2 - 0.9 K/uL   Eosinophils Relative 2 %   Eosinophils Absolute 0.2 0 - 0.7 K/uL   Basophils Relative 2 %   Basophils Absolute 0.2 (H) 0 - 0.1 K/uL  Comprehensive metabolic panel     Status: Abnormal   Collection Time: 08/27/16  6:06 PM  Result Value Ref Range   Sodium 139 135 - 145 mmol/L   Potassium 4.5 3.5 - 5.1 mmol/L   Chloride 104 101 - 111 mmol/L   CO2 27 22 - 32 mmol/L   Glucose, Bld 118 (H) 65 - 99 mg/dL   BUN 13 6 - 20 mg/dL   Creatinine, Ser 0.99 0.44 - 1.00 mg/dL   Calcium 10.0 8.9 - 10.3 mg/dL   Total Protein 8.2 (H) 6.5 - 8.1 g/dL   Albumin 4.3 3.5 - 5.0 g/dL   AST 28 15 - 41 U/L   ALT 22 14 - 54 U/L   Alkaline  Phosphatase 111 38 - 126 U/L   Total Bilirubin 0.5 0.3 - 1.2 mg/dL   GFR calc non Af Amer 60 (L) >60 mL/min   GFR calc Af Amer >60 >60 mL/min    Comment: (NOTE) The eGFR has been calculated using the CKD EPI equation. This calculation has not been validated in all clinical situations. eGFR's persistently <60 mL/min signify possible Chronic Kidney Disease.    Anion gap 8 5 - 15  Ethanol     Status: None   Collection Time: 08/27/16  6:06 PM  Result Value Ref Range   Alcohol, Ethyl (B) <5 <5 mg/dL    Comment:        LOWEST DETECTABLE LIMIT FOR SERUM ALCOHOL IS 5 mg/dL FOR MEDICAL PURPOSES ONLY   Urine Drug Screen, Qualitative ([facility] only)     Status: Abnormal   Collection Time: 08/27/16  6:06 PM  Result Value Ref Range   Tricyclic, Ur Screen POSITIVE (A) NONE DETECTED   Amphetamines, Ur Screen NONE DETECTED NONE DETECTED   MDMA (Ecstasy)Ur Screen NONE DETECTED NONE DETECTED   Cocaine Metabolite,Ur [place] NONE DETECTED NONE DETECTED   Opiate, Ur Screen NONE DETECTED NONE DETECTED   Phencyclidine (PCP) Ur S NONE DETECTED NONE DETECTED   Cannabinoid 50 Ng, Ur [place] NONE DETECTED NONE DETECTED   Barbiturates, Ur Screen NONE DETECTED NONE DETECTED   Benzodiazepine, Ur Scrn NONE DETECTED NONE DETECTED   Methadone Scn, Ur NONE DETECTED NONE DETECTED    Comment: (NOTE) 706  Tricyclics, urine               Cutoff 1000 ng/mL 200  Amphetamines, urine  Cutoff 1000 ng/mL 300  MDMA (Ecstasy), urine           Cutoff 500 ng/mL 400  Cocaine Metabolite, urine       Cutoff 300 ng/mL 500  Opiate, urine                   Cutoff 300 ng/mL 600  Phencyclidine (PCP), urine      Cutoff 25 ng/mL 700  Cannabinoid, urine              Cutoff 50 ng/mL 800  Barbiturates, urine             Cutoff 200 ng/mL 900  Benzodiazepine, urine           Cutoff 200 ng/mL 1000 Methadone, urine                Cutoff 300 ng/mL 1100 1200 The urine drug screen provides only a preliminary, unconfirmed 1300 analytical  test result and should not be used for non-medical 1400 purposes. Clinical consideration and professional judgment should 1500 be applied to any positive drug screen result due to possible 1600 interfering substances. A more specific alternate chemical method 1700 must be used in order to obtain a confirmed analytical result.  1800 Gas chromato graphy / mass spectrometry (GC/MS) is the preferred 1900 confirmatory method.   Urinalysis, Complete w Microscopic     Status: Abnormal   Collection Time: 08/27/16  6:06 PM  Result Value Ref Range   Color, Urine YELLOW (A) YELLOW   APPearance CLEAR (A) CLEAR   Specific Gravity, Urine 1.020 1.005 - 1.030   pH 6.0 5.0 - 8.0   Glucose, UA NEGATIVE NEGATIVE mg/dL   Hgb urine dipstick NEGATIVE NEGATIVE   Bilirubin Urine NEGATIVE NEGATIVE   Ketones, ur NEGATIVE NEGATIVE mg/dL   Protein, ur 30 (A) NEGATIVE mg/dL   Nitrite NEGATIVE NEGATIVE   Leukocytes, UA TRACE (A) NEGATIVE   RBC / HPF 0-5 0 - 5 RBC/hpf   WBC, UA 0-5 0 - 5 WBC/hpf   Bacteria, UA NONE SEEN NONE SEEN   Squamous Epithelial / LPF 0-5 (A) NONE SEEN   Mucous PRESENT    Hyaline Casts, UA PRESENT     Blood Alcohol level:  Lab Results  Component Value Date   ETH <5 08/27/2016    Metabolic Disorder Labs:  Lab Results  Component Value Date   HGBA1C 6.5 (H) 09/19/2014   MPG 140 (H) 09/19/2014   No results found for: PROLACTIN No results found for: CHOL, TRIG, HDL, CHOLHDL, VLDL, LDLCALC  Current Medications: Current Facility-Administered Medications  Medication Dose Route Frequency Provider Last Rate Last Dose  . acetaminophen (TYLENOL) tablet 650 mg  650 mg Oral Q6H PRN Audery Amel, MD      . albuterol (PROVENTIL HFA;VENTOLIN HFA) 108 (90 Base) MCG/ACT inhaler 2 puff  2 puff Inhalation Q4H PRN Audery Amel, MD      . alum & mag hydroxide-simeth (MAALOX/MYLANTA) 200-200-20 MG/5ML suspension 30 mL  30 mL Oral Q4H PRN Audery Amel, MD      . apixaban (ELIQUIS) tablet 5  mg  5 mg Oral BID Audery Amel, MD      . carvedilol (COREG) tablet 3.125 mg  3.125 mg Oral BID WC Audery Amel, MD      . dicyclomine (BENTYL) tablet 20 mg  20 mg Oral TID WC PRN Audery Amel, MD      . diltiazem (CARDIZEM SR) 12 hr  capsule 60 mg  60 mg Oral Q12H Gonzella Lex, MD      . escitalopram (LEXAPRO) tablet 20 mg  20 mg Oral Daily Gonzella Lex, MD      . Derrill Memo ON 08/29/2016] fluticasone (FLONASE) 50 MCG/ACT nasal spray 2 spray  2 spray Each Nare Daily Gonzella Lex, MD      . Derrill Memo ON 08/29/2016] furosemide (LASIX) tablet 20 mg  20 mg Oral Daily Gonzella Lex, MD      . gabapentin (NEURONTIN) tablet 300 mg  300 mg Oral TID Gonzella Lex, MD      . Derrill Memo ON 08/29/2016] isosorbide mononitrate (IMDUR) 24 hr tablet 30 mg  30 mg Oral Daily Tandy Grawe T Haygen Zebrowski, MD      . magnesium hydroxide (MILK OF MAGNESIA) suspension 30 mL  30 mL Oral Daily PRN Gonzella Lex, MD      . OLANZapine (ZYPREXA) tablet 5 mg  5 mg Oral QHS Gonzella Lex, MD      . oxyCODONE-acetaminophen (PERCOCET/ROXICET) 5-325 MG per tablet 1 tablet  1 tablet Oral Q6H PRN Gonzella Lex, MD      . Derrill Memo ON 08/29/2016] pantoprazole (PROTONIX) EC tablet 40 mg  40 mg Oral Daily Gonzella Lex, MD      . Derrill Memo ON 08/29/2016] potassium chloride SA (K-DUR,KLOR-CON) CR tablet 10 mEq  10 mEq Oral Daily Gonzella Lex, MD      . traZODone (DESYREL) tablet 50 mg  50 mg Oral QHS Gonzella Lex, MD       PTA Medications: Prescriptions Prior to Admission  Medication Sig Dispense Refill Last Dose  . albuterol (PROVENTIL HFA;VENTOLIN HFA) 108 (90 Base) MCG/ACT inhaler Inhale 2 puffs into the lungs every 6 (six) hours as needed for wheezing or shortness of breath. 1 Inhaler 2 prn at prn  . apixaban (ELIQUIS) 5 MG TABS tablet Take 1 tablet (5 mg total) by mouth 2 (two) times daily. 30 tablet 0 unknown at unknown  . benzonatate (TESSALON) 100 MG capsule Take 100 mg by mouth 2 (two) times daily as needed for cough.   unknown at  unknown  . calcium carbonate (TUMS - DOSED IN MG ELEMENTAL CALCIUM) 500 MG chewable tablet Chew 1 tablet by mouth daily as needed for indigestion or heartburn.   prn at prn  . carvedilol (COREG) 3.125 MG tablet Take 1 tablet (3.125 mg total) by mouth 2 (two) times daily with a meal. 30 tablet 0 unknown at unknown  . dicyclomine (BENTYL) 10 MG capsule TAKE 2 CAPSULES BY MOUTH EVERY SIX HOURS (Patient taking differently: TAKE 1 CAPSULES BY MOUTH THREE TIMES DAILY AS NEEDED FOR SPASMS.) 240 capsule 5 unknown at unknown  . dicyclomine (BENTYL) 20 MG tablet Take 1 tablet (20 mg total) by mouth 3 (three) times daily as needed for spasms. (Patient not taking: Reported on 08/28/2016) 30 tablet 0 Not Taking at Unknown time  . diltiazem (CARDIZEM SR) 60 MG 12 hr capsule TAKE 1 CAPSULE (60 MG TOTAL) BY MOUTH EVERY 6 (SIX) HOURS AS NEEDED. 120 capsule 3 unknown at unknown  . diphenhydrAMINE (BENADRYL) 25 MG tablet Take 1-2 tablets (25-50 mg total) by mouth every 6 (six) hours as needed for itching (Rash). 30 tablet 0 prn at prn  . EPINEPHrine 0.3 mg/0.3 mL IJ SOAJ injection Inject 0.3 mLs (0.3 mg total) into the muscle once. 1 Device 0 prn at prn  . escitalopram (LEXAPRO) 10 MG tablet Take 1 tablet (  10 mg total) by mouth daily. 30 tablet 0 unknown at unknown  . furosemide (LASIX) 20 MG tablet TAKE 1 TABLET BY MOUTH EVERY DAY (Patient taking differently: TAKE 1 TABLET BY MOUTH EVERY DAY prn) 90 tablet 2 prn at prn  . gabapentin (NEURONTIN) 600 MG tablet Take 0.5 tablets (300 mg total) by mouth every 8 (eight) hours. 30 tablet 0 unknown at unknown  . isosorbide mononitrate (IMDUR) 30 MG 24 hr tablet Take 1 tablet (30 mg total) by mouth daily. 30 tablet 0 unknown at unknown  . Multiple Vitamin (MULTIVITAMIN WITH MINERALS) TABS tablet Take 2 tablets by mouth daily.    unknown at unknown  . mupirocin ointment (BACTROBAN) 2 % Place 1 application into the nose 3 (three) times daily. 22 g 0 prn at prn  . ondansetron  (ZOFRAN) 4 MG tablet Take 1 tablet (4 mg total) by mouth every 8 (eight) hours as needed for nausea or vomiting. 30 tablet 0 prn at prn  . oxyCODONE-acetaminophen (PERCOCET/ROXICET) 5-325 MG tablet Take 1 tablet by mouth 2 (two) times daily as needed for severe pain. (Patient not taking: Reported on 08/28/2016) 30 tablet 0 Completed Course at Unknown time  . pantoprazole (PROTONIX) 40 MG tablet TAKE ONE TABLET BY MOUTH EVERY DAY 30 tablet 5 unknown at unknown  . potassium chloride (K-DUR) 10 MEQ tablet Take 1 tablet (10 mEq total) by mouth 2 (two) times daily. (Patient taking differently: Take 10 mEq by mouth 2 (two) times daily as needed. ) 60 tablet 11 prn at prn  . traZODone (DESYREL) 50 MG tablet Take 1 tablet (50 mg total) by mouth at bedtime. (Patient taking differently: Take 50 mg by mouth at bedtime as needed for sleep. ) 30 tablet 5 prn at prn  . triamcinolone (NASACORT AQ) 55 MCG/ACT AERO nasal inhaler Place 2 sprays into the nose daily. (Patient not taking: Reported on 08/28/2016) 1 Inhaler 12 Not Taking at Unknown time    Musculoskeletal: Strength & Muscle Tone: within normal limits Gait & Station: normal Patient leans: N/A  Psychiatric Specialty Exam: Physical Exam  Nursing note and vitals reviewed. Constitutional: She appears well-developed and well-nourished. She appears distressed.  HENT:  Head: Normocephalic and atraumatic.  Eyes: Conjunctivae are normal. Pupils are equal, round, and reactive to light.  Neck: Normal range of motion.  Cardiovascular: Normal heart sounds.   Respiratory: Effort normal. She has wheezes.  GI: Soft.  Musculoskeletal: Normal range of motion.  Neurological: She is alert.  Skin: Skin is warm and dry.  Psychiatric: Her affect is blunt. Her speech is delayed and tangential. She is slowed, withdrawn and actively hallucinating. She expresses impulsivity. She exhibits a depressed mood. She expresses suicidal ideation. She expresses suicidal plans. She  exhibits abnormal recent memory.    Review of Systems  Constitutional: Positive for malaise/fatigue.  HENT: Negative.   Eyes: Negative.   Respiratory: Negative.   Cardiovascular: Negative.   Gastrointestinal: Negative.   Musculoskeletal: Positive for back pain and joint pain.  Skin: Negative.   Neurological: Positive for weakness.  Psychiatric/Behavioral: Positive for depression, hallucinations, memory loss and suicidal ideas. Negative for substance abuse. The patient is nervous/anxious.     Blood pressure 122/71, pulse 82, temperature 98.6 F (37 C), temperature source Oral, resp. rate 20, height _0  (1.676 m), weight 86.2 kg (190 lb), SpO2 96 %.Body mass index is 30.67 kg/m.  General Appearance: Disheveled  Eye Contact:  Minimal  Speech:  Garbled  Volume:  Decreased  Mood:  Depressed  Affect:  Congruent  Thought Process:  Goal Directed  Orientation:  Full (Time, Place, and Person)  Thought Content:  Paranoid Ideation, Rumination and Tangential  Suicidal Thoughts:  Yes.  with intent/plan  Homicidal Thoughts:  No  Memory:  Immediate;   Good Recent;   Fair Remote;   Fair  Judgement:  Fair  Insight:  Fair  Psychomotor Activity:  Decreased  Concentration:  Concentration: Fair  Recall:  [employer] of Knowledge:  Fair  Language:  Fair  Akathisia:  No  Handed:  Right  AIMS (if indicated):     Assets:  Communication Skills Desire for Improvement Financial Resources/Insurance Resilience  ADL's:  Intact  Cognition:  Impaired,  Mild  Sleep:       Treatment Plan Summary: Daily contact with patient to assess and evaluate symptoms and progress in treatment, Medication management and Plan Patient is being admitted to the psychiatry ward. 15 minute checks. Full set of labs including checking the TSH the hemoglobin A1c and lipid panel. Continue outpatient medicines for her multiple medical problems. Continue Lexapro but at a higher dose of 20 mg. Start Zyprexa at 5 mg at night  for psychotic depression. Patient agrees to plan.  Observation Level/Precautions:  15 minute checks  Laboratory:  HbAIC  Psychotherapy:    Medications:    Consultations:    Discharge Concerns:    Estimated LOS:  Other:     Physician Treatment Plan for Primary Diagnosis: Schizoaffective disorder ([place]) Long Term Goal(s): Improvement in symptoms so as ready for discharge  Short Term Goals: Ability to verbalize feelings will improve and Ability to disclose and discuss suicidal ideas  Physician Treatment Plan for Secondary Diagnosis: Principal Problem:   Schizoaffective disorder ([place]) Active Problems:   Essential hypertension, benign   IBS (irritable bowel syndrome)   History of DVT (deep vein thrombosis)   Chronic pain   Musculoskeletal pain  Long Term Goal(s): Improvement in symptoms so as ready for discharge  Short Term Goals: Ability to identify and develop effective coping behaviors will improve and Ability to maintain clinical measurements within normal limits will improve  I certify that inpatient services furnished can reasonably be expected to improve the patient's condition.    Alethia Berthold, MD 12/28/20175:03 PM

## 2016-08-28 NOTE — BH Assessment (Signed)
Patient is to be admitted to [facility] by Dr. Weber Cooks,  Attending Physician will be Dr. Bary Leriche   Patient has been assigned to room 316, by [facility] staff is aware of the admission nurse Rod Holler and ED physician and nurse secretary

## 2016-08-29 LAB — LIPID PANEL
Cholesterol: 251 mg/dL — ABNORMAL HIGH (ref 0–200)
HDL: 38 mg/dL — ABNORMAL LOW (ref >40)
LDL Cholesterol: 196 mg/dL — ABNORMAL HIGH (ref 0–99)
Total CHOL/HDL Ratio: 6.6 RATIO
Triglycerides: 83 mg/dL (ref <150)
VLDL: 17 mg/dL (ref 0–40)

## 2016-08-29 LAB — TSH: TSH: 2.435 u[IU]/mL (ref 0.350–4.500)

## 2016-08-29 IMAGING — DX DG CHEST 2V
2 series · 2 of 2 positions shown · non-contrast
Comparison: 10/12/2016.

CLINICAL DATA: Nausea. History of hypertension, pulmonary embolism
and myocardial infarction.

EXAM:
CHEST  2 VIEW

[chest pa]
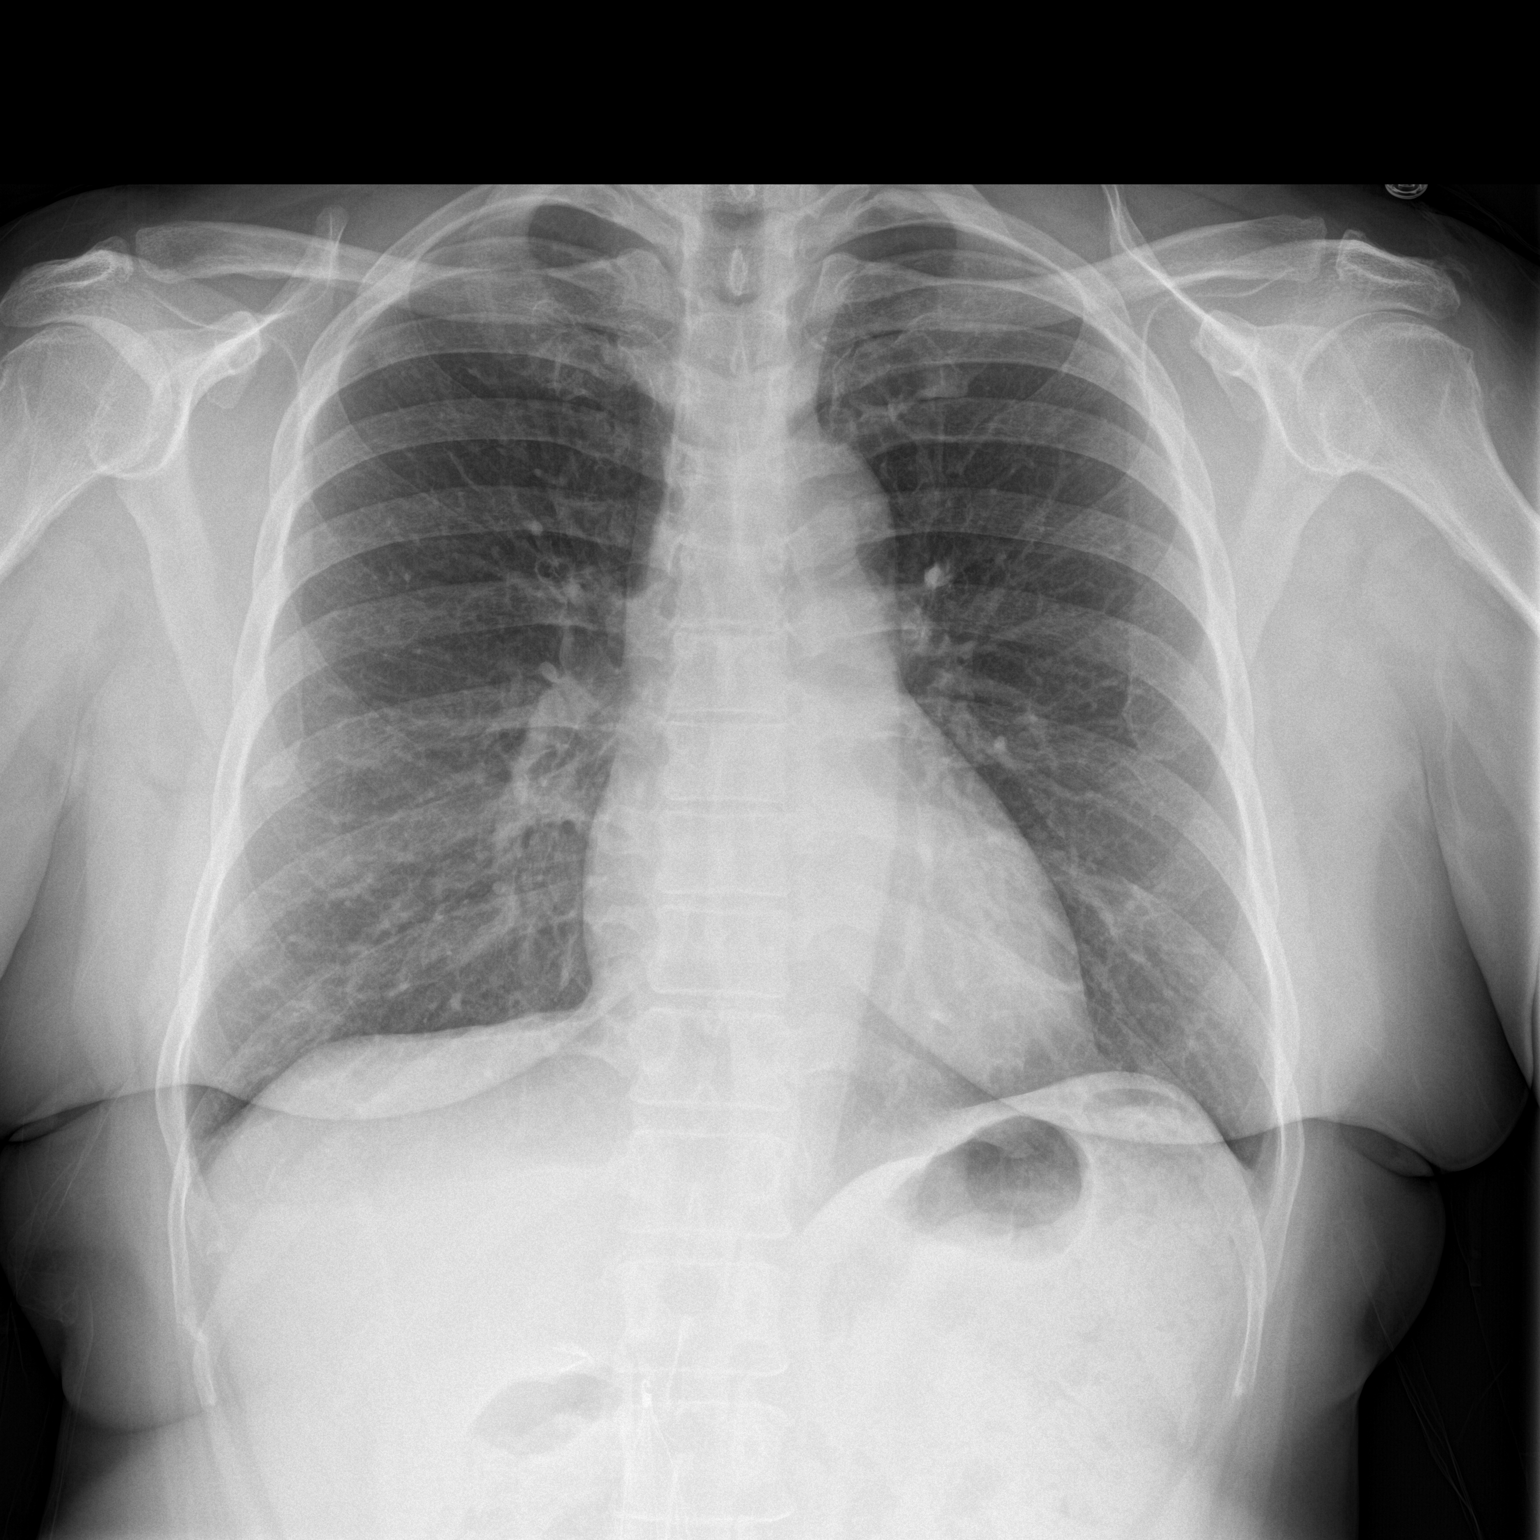

[chest lat]
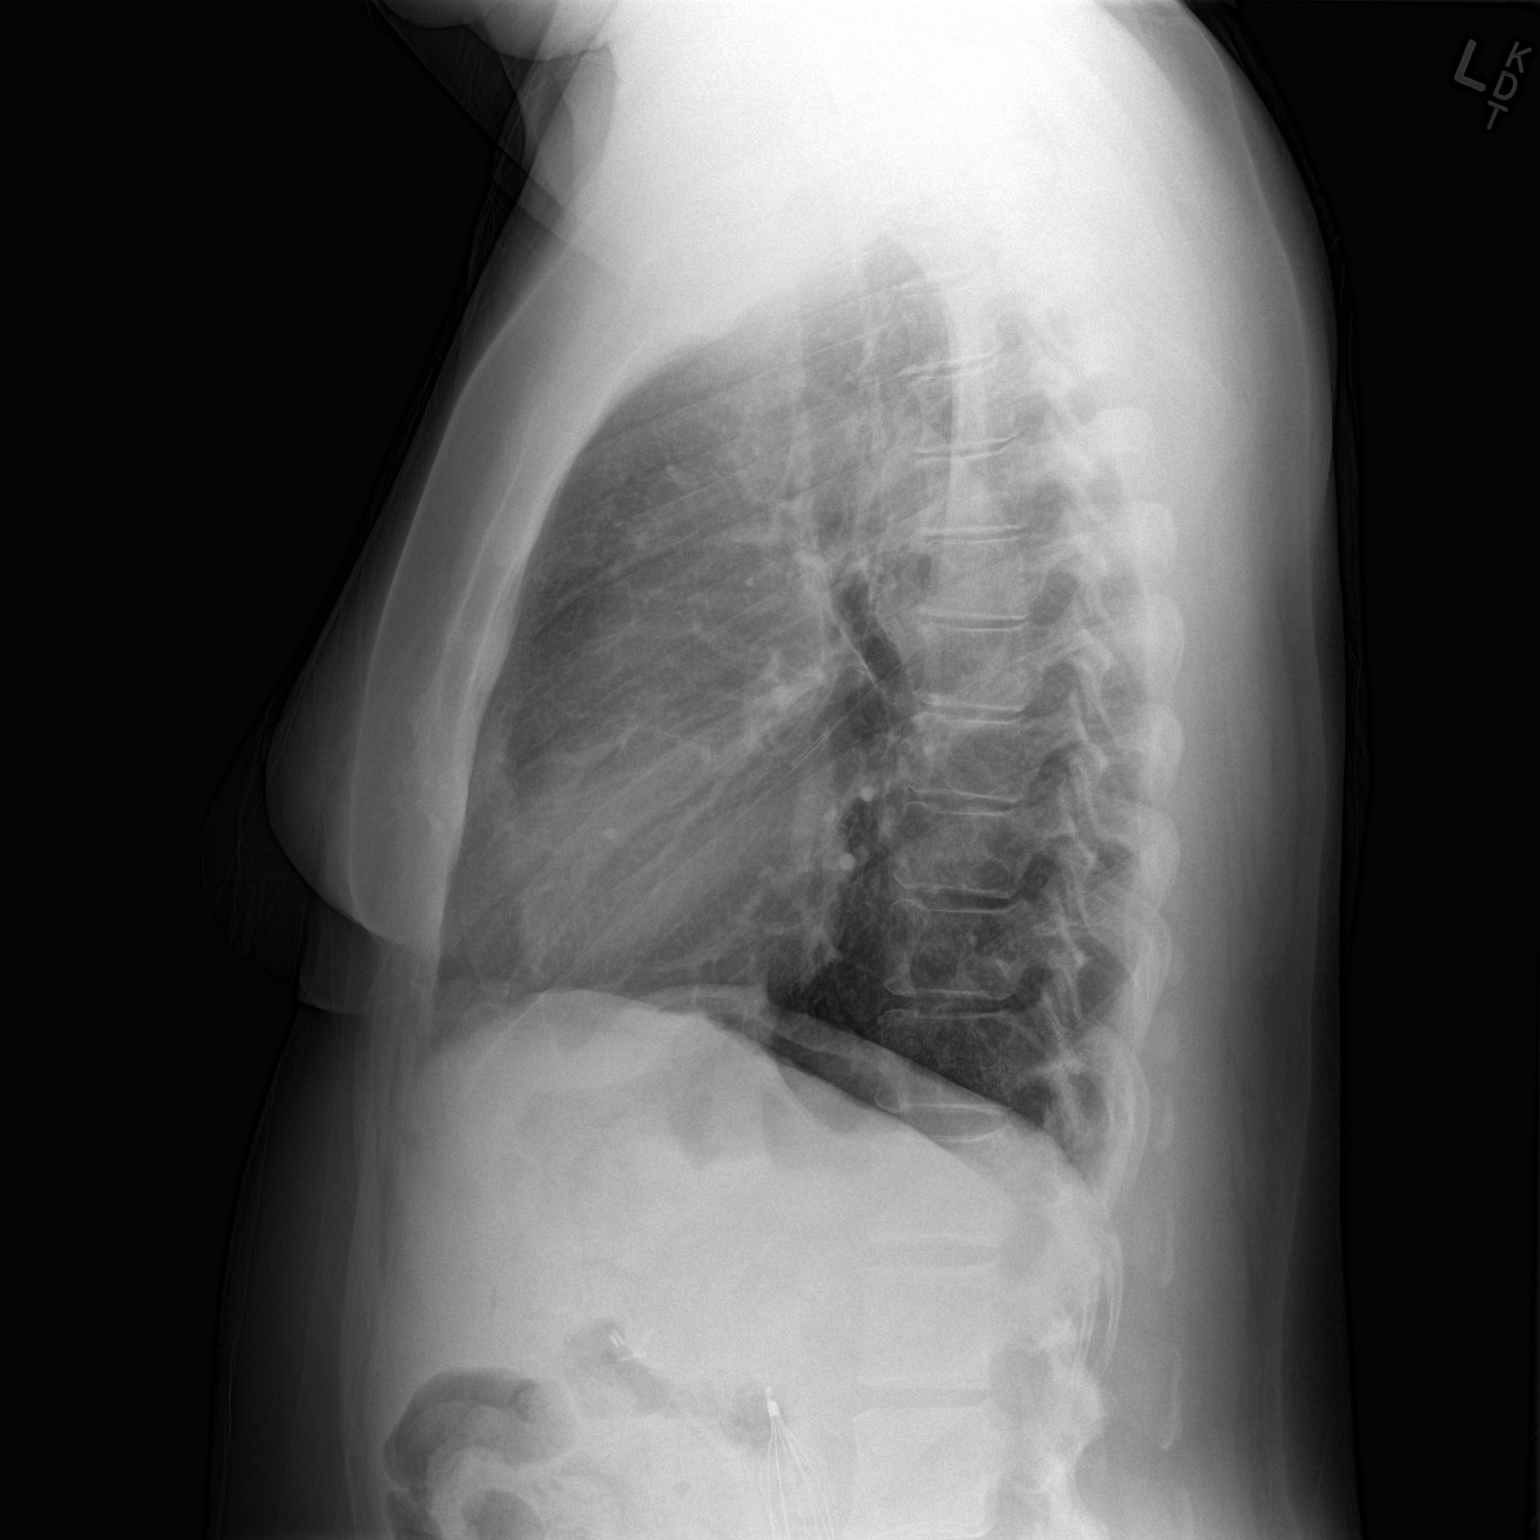

[2 of 2 positions shown; findings below may reference images not displayed]

FINDINGS: Normal sized heart. Clear lungs. Cervical spine fixation hardware.
Inferior vena cava filter and cholecystectomy clips.
IMPRESSION: No acute abnormality.

## 2016-08-29 MED ORDER — TRAZODONE HCL 100 MG PO TABS
100.0000 mg | ORAL_TABLET | Freq: Every day | ORAL | Status: DC
  Administered 2016-08-29 – 2016-08-30 (×2): 100 mg via ORAL
  Filled 2016-08-29 (×2): qty 1

## 2016-08-29 NOTE — Progress Notes (Signed)
Patient affect flat.  Avoids eye contact.  First am irritable and refused to get up for am medications.  Isolates to room except for meals.  No group attendance.  Noted to hold on to walls when walking and walker provided.  Speech soft and very low.  Support and encouragement offered.  Safety maintained.

## 2016-08-29 NOTE — [facility] Group Notes (Signed)
Patient did not participate in group today.  Olanda Boughner LCSW

## 2016-08-29 NOTE — Progress Notes (Signed)
D: Patient is very labile. Verbally aggressive at beginning of shift but then apologizing towards the middle of shift. States she has these mood changes that she can control sometimes but other times she can't. States she needs something for her cough. Also c/o pain in her hip.  A: Medication given with education. Encouragement provided. MD notified about couch and pain. PRN Flexeril and Robitussin given.  R: Patient was compliant with medication. She remains calm and cooperative. Safety maintained with 15 min checks.

## 2016-08-29 NOTE — Progress Notes (Signed)
[facility] MD Progress Note  08/29/2016 10:10 AM Valerie Vazquez  MRN:  [id number]  Subjective:  Valerie Vazquez met with treatment team today. She appears severely depressed with severe psychomotor retardation. She is moving very slowly as if in pain. There is severe psychomotor retardation. She wispers her answers and is very difficult to understand. She discloses that her boyfriend, Purcell Nails, died unexpectedly 6 months ago, that she lost her housing and was forced to move in with her neighbor who no longer wants her in the apartment because she is so sick. Her plan was to live in a car where she already deposited Lawrence's ashes. She came to the hospital suicidal and continues to be so. According to the nursing note the patient has been low by and verbally aggressive. She carries diagnosis of schizoaffective disorder. She was started on Zyprexa and seems to tolerate medications well.  Per Nursing: D: Patient is very labile. Verbally aggressive at beginning of shift but then apologizing towards the middle of shift. States she has these mood changes that she can control sometimes but other times she can't. States she needs something for her cough. Also c/o pain in her hip.  A: Medication given with education. Encouragement provided. MD notified about couch and pain. PRN Flexeril and Robitussin given.  R: Patient was compliant with medication. She remains calm and cooperative. Safety maintained with 15 min checks.   Principal Problem: Schizoaffective disorder, depressive type ([place]) Diagnosis:   Patient Active Problem List   Diagnosis Date Noted  . Schizoaffective disorder, depressive type ([place]) [F25.1] 08/28/2016  . Sinusitis, acute maxillary [J01.00] 08/27/2016  . Suicide attempt [T14.91XA] 08/27/2016  . Rash and nonspecific skin eruption [R21] 04/16/2016  . Colitis [K52.9] 04/08/2016  . Tremor [R25.1] 03/19/2016  . Left shoulder pain [M25.512] 02/18/2016  . Abdominal distension [R14.0]  02/18/2016  . Absolute anemia [D64.9] 01/29/2016  . Chronic pain [G89.29] 01/29/2016  . Neurogenic pain [M79.2] 01/29/2016  . Musculoskeletal pain [M79.1] 01/29/2016  . Long term current use of opiate analgesic [Z79.891] 01/29/2016  . Long term prescription opiate use [Z79.891] 01/29/2016  . Opiate use (15 MME/Day) [F11.90] 01/29/2016  . Encounter for therapeutic drug level monitoring [Z51.81] 01/29/2016  . Encounter for pain management planning [Z01.89] 01/29/2016  . Chronic neck pain (midline) [M54.2, G89.29] 01/29/2016  . Chronic chest wall pain [R07.89, G89.29] 01/29/2016  . Chronic low back pain (Location of Secondary source of pain) (Bilateral) (R>L) [M54.5, G89.29] 01/29/2016  . Disturbance of skin sensation [R20.9] 01/29/2016  . Chronic shoulder pain (Location of Primary Source of Pain) (Left) [M25.512, G89.29] 01/29/2016  . Chronic hip pain (Location of Tertiary source of pain) (Bilateral) (R>L) [M25.559, G89.29] 01/29/2016  . Osteoarthritis of hip (Bilateral) [M16.0] 01/29/2016  . Lumbar facet arthropathy (Severe at L2-3, L3-4, L4-5, and L5-S1) [M12.88] 01/29/2016  . Lumbar central spinal stenosis (moderate at L4-5; mild at L2-3 and L3-4) [M48.061] 01/29/2016  . Lumbar foraminal stenosis (L4-5) (Right) [M99.83] 01/29/2016  . Lumbar facet syndrome [M12.88] 01/29/2016  . Hx of cervical spine surgery [Z98.890] 01/29/2016  . Numbness [R20.0] 11/19/2015  . Right wrist pain [M25.531] 11/09/2015  . Breast pain [N64.4] 10/19/2015  . Chest pain [R07.9] 10/14/2015  . Headache [R51] 10/14/2015  . Abdominal pain [R10.9] 10/14/2015  . Depression [F32.9] 10/14/2015  . Lip swelling [R22.0] 10/14/2015  . Pulmonary embolism ([place]) [I26.99] 08/15/2015  . Bilateral pneumonia [J18.9] 08/15/2015  . Nausea [R11.0] 08/15/2015  . Shortness of breath [R06.02] 07/31/2015  . Pleural effusion, bilateral [J90]  07/31/2015  . Cough [R05] 07/31/2015  . Chronic pain of multiple sites [R52, G89.29]  06/05/2015  . Chronic lumbosacral pain [M54.5, G89.29] 06/05/2015  . Esophageal spasm [K22.4] 06/05/2015  . History of DVT (deep vein thrombosis) [Z86.718] 02/25/2015  . Hypercementosis [K03.4] 02/13/2015  . Constipation [K59.00] 12/12/2014  . Coronary vasospasm ([place]) [I20.1] 12/09/2014  . History of pulmonary embolus (PE) [Z86.711] 09/19/2014  . Impingement syndrome of shoulder [M75.40] 08/30/2014  . Impingement syndrome of left shoulder [M75.42] 08/30/2014  . Injury of superior glenoid labrum of shoulder joint [S43.439A] 08/16/2014  . Supraspinatus tenosynovitis [M65.819] 08/16/2014  . Cervical spondylosis without myelopathy [M47.812] 08/16/2014  . Anxiety and depression [F41.8] 04/13/2014  . Osteoarthritis of hip (Left) [M16.12] 04/11/2014  . S/P THR (total hip replacement) (Left) [F75.102] 04/11/2014  . IBS (irritable bowel syndrome) [K58.9] 04/11/2014  . GERD (gastroesophageal reflux disease) [K21.9] 04/11/2014  . Insomnia [G47.00] 04/11/2014  . Essential hypertension, benign [I10] 10/28/2013  . Left hip pain [M25.552] 10/28/2013  . Anemia [D64.9] 10/28/2013  . HLD (hyperlipidemia) [E78.5] 10/28/2013   Total Time spent with patient: 20 minutes  Past Psychiatric History: Schizoaffective disorder.  Past Medical History:  Past Medical History:  Diagnosis Date  . Anemia   . Bilateral renal cysts   . Chronic back pain    "upper and lower" (09/19/2014)  . Depression    "major" (09/19/2014)  . DVT (deep venous thrombosis) ([place])   . Esophageal spasm   . Gallstones   . Gastritis   . Gastroenteritis   . GERD (gastroesophageal reflux disease)   . Headache    "couple /month lately" (09/19/2014)  . High blood pressure   . High cholesterol   . History of blood transfusion 1985   related to hysterectomy  . Hypertension   . IBS (irritable bowel syndrome)   . Migraine    "a few times/yr" (09/19/2014)  . Myocardial infarction 10/2010 X 3   "while hospitalized"  . Osteoarthritis     "qwhere; mostly around my joints" (09/19/2014)  . Pneumonia 04/2014  . PTSD (post-traumatic stress disorder)   . Pulmonary embolism ([place]) 04/2001; 09/19/2014   "after gallbladder OR; "  . Schizoaffective disorder ([place])   . Sleep apnea    "mild" (09/19/2014)  . Snoring    a. sleep study 5/16: No OSA  . Spinal stenosis   . Spondylosis   . Type II diabetes mellitus ([place])    "dx'd in 2007; lost weight; no RX for ~ 4 yr now" (09/19/2014)    Past Surgical History:  Procedure Laterality Date  . ABDOMINAL HYSTERECTOMY  1985  . ANTERIOR CERVICAL DECOMP/DISCECTOMY FUSION  10/2012  . APPENDECTOMY  1985  . BACK SURGERY    . BREAST CYST EXCISION Right   . BREAST EXCISIONAL BIOPSY Right YRS AGO   NEG  . CARDIAC CATHETERIZATION   2000's; 2009  . CHOLECYSTECTOMY  2002  . EXCISION/RELEASE BURSA HIP Right 12/1984  . HERNIA REPAIR  1985  . LEFT HEART CATHETERIZATION WITH CORONARY ANGIOGRAM N/A 12/09/2014   Procedure: LEFT HEART CATHETERIZATION WITH CORONARY ANGIOGRAM;  Surgeon: Burnell Blanks, MD;  Location: [facility] CATH LAB;  Service: Cardiovascular;  Laterality: N/A;  . TONSILLECTOMY AND ADENOIDECTOMY  ~ 1968  . TOTAL HIP ARTHROPLASTY Left 04-06-2014  . UMBILICAL HERNIA REPAIR  1985  . VENA CAVA FILTER PLACEMENT  03/2014   Family History:  Family History  Problem Relation Age of Onset  . Stroke Mother     Deceased  . Lung  cancer Father     Deceased  . Healthy Daughter   . Healthy Son     x 2  . Other Son     Suicide  . Heart attack Neg Hx    Family Psychiatric  History: See H&P. Social History:  History  Alcohol Use No     History  Drug Use No    Social History   Social History  . Marital status: Legally Separated    Spouse name: N/A  . Number of children: N/A  . Years of education: N/A   Social History Main Topics  . Smoking status: Former Smoker    Packs/day: 1.50    Years: 10.00    Types: Cigarettes    Quit date: 04/12/1979  . Smokeless tobacco: Never Used  .  Alcohol use No  . Drug use: No  . Sexual activity: Not Currently   Other Topics Concern  . None   Social History Narrative   Lives with fiancee in an apartment on the first floor.  Has 3 grown children.  One child passed away.  On disability 1994-12-02 - 2000, 2007 - current.     Education: some college.   Additional Social History:                         Sleep: Poor  Appetite:  Fair  Current Medications: Current Facility-Administered Medications  Medication Dose Route Frequency Provider Last Rate Last Dose  . acetaminophen (TYLENOL) tablet 650 mg  650 mg Oral Q6H PRN Gonzella Lex, MD      . albuterol (PROVENTIL HFA;VENTOLIN HFA) 108 (90 Base) MCG/ACT inhaler 2 puff  2 puff Inhalation Q4H PRN Gonzella Lex, MD   2 puff at 08/29/16 0043  . alum & mag hydroxide-simeth (MAALOX/MYLANTA) 200-200-20 MG/5ML suspension 30 mL  30 mL Oral Q4H PRN Gonzella Lex, MD      . apixaban (ELIQUIS) tablet 5 mg  5 mg Oral BID Gonzella Lex, MD      . carvedilol (COREG) tablet 3.125 mg  3.125 mg Oral BID WC Gonzella Lex, MD      . cyclobenzaprine (FLEXERIL) tablet 10 mg  10 mg Oral Q6H PRN Gonzella Lex, MD   10 mg at 08/28/16 2358  . dicyclomine (BENTYL) tablet 20 mg  20 mg Oral TID WC PRN Gonzella Lex, MD      . diltiazem (CARDIZEM SR) 12 hr capsule 60 mg  60 mg Oral Q12H John T Clapacs, MD      . escitalopram (LEXAPRO) tablet 20 mg  20 mg Oral Daily John T Clapacs, MD      . fluticasone (FLONASE) 50 MCG/ACT nasal spray 2 spray  2 spray Each Nare Daily John T Clapacs, MD      . furosemide (LASIX) tablet 20 mg  20 mg Oral Daily Gonzella Lex, MD      . gabapentin (NEURONTIN) tablet 300 mg  300 mg Oral TID Gonzella Lex, MD   300 mg at 08/28/16 2358  . guaiFENesin (ROBITUSSIN) 100 MG/5ML solution 300 mg  15 mL Oral Q6H PRN Gonzella Lex, MD   300 mg at 08/28/16 2349  . isosorbide mononitrate (IMDUR) 24 hr tablet 30 mg  30 mg Oral Daily John T Clapacs, MD      . magnesium hydroxide  (MILK OF MAGNESIA) suspension 30 mL  30 mL Oral Daily PRN Gonzella Lex, MD      .  OLANZapine (ZYPREXA) tablet 5 mg  5 mg Oral QHS Gonzella Lex, MD   5 mg at 08/28/16 2357  . oxyCODONE-acetaminophen (PERCOCET/ROXICET) 5-325 MG per tablet 1 tablet  1 tablet Oral Q6H PRN Gonzella Lex, MD      . oxymetazoline (AFRIN) 0.05 % nasal spray 1 spray  1 spray Each Nare BID Gonzella Lex, MD   1 spray at 08/29/16 0003  . pantoprazole (PROTONIX) EC tablet 40 mg  40 mg Oral Daily John T Clapacs, MD      . potassium chloride SA (K-DUR,KLOR-CON) CR tablet 10 mEq  10 mEq Oral Daily Gonzella Lex, MD      . traZODone (DESYREL) tablet 50 mg  50 mg Oral QHS Gonzella Lex, MD   50 mg at 08/28/16 2357    Lab Results:  Results for orders placed or performed during the hospital encounter of 08/28/16 (from the past 48 hour(s))  Lipid panel     Status: Abnormal   Collection Time: 08/29/16  7:21 AM  Result Value Ref Range   Cholesterol 251 (H) 0 - 200 mg/dL   Triglycerides 83 <150 mg/dL   HDL 38 (L) >40 mg/dL   Total CHOL/HDL Ratio 6.6 RATIO   VLDL 17 0 - 40 mg/dL   LDL Cholesterol 196 (H) 0 - 99 mg/dL    Comment:        Total Cholesterol/HDL:CHD Risk Coronary Heart Disease Risk Table                     Men   Women  1/2 Average Risk   3.4   3.3  Average Risk       5.0   4.4  2 X Average Risk   9.6   7.1  3 X Average Risk  23.4   11.0        Use the calculated Patient Ratio above and the CHD Risk Table to determine the patient's CHD Risk.        ATP III CLASSIFICATION (LDL):  <100     mg/dL   Optimal  100-129  mg/dL   Near or Above                    Optimal  130-159  mg/dL   Borderline  160-189  mg/dL   High  >190     mg/dL   Very High   TSH     Status: None   Collection Time: 08/29/16  7:21 AM  Result Value Ref Range   TSH 2.435 0.350 - 4.500 uIU/mL    Comment: Performed by a 3rd Generation assay with a functional sensitivity of <=0.01 uIU/mL.    Blood Alcohol level:  Lab Results   Component Value Date   ETH <5 60/73/7106    Metabolic Disorder Labs: Lab Results  Component Value Date   HGBA1C 6.5 (H) 09/19/2014   MPG 140 (H) 09/19/2014   No results found for: PROLACTIN Lab Results  Component Value Date   CHOL 251 (H) 08/29/2016   TRIG 83 08/29/2016   HDL 38 (L) 08/29/2016   CHOLHDL 6.6 08/29/2016   VLDL 17 08/29/2016   LDLCALC 196 (H) 08/29/2016    Physical Findings: AIMS:  , ,  ,  ,    CIWA:    COWS:     Musculoskeletal: Strength & Muscle Tone: within normal limits Gait & Station: normal Patient leans: N/A  Psychiatric Specialty Exam: Physical Exam  Nursing note and vitals reviewed.   Review of Systems  Musculoskeletal: Positive for back pain.  Psychiatric/Behavioral: Positive for depression, hallucinations and suicidal ideas. The patient has insomnia.   All other systems reviewed and are negative.   Blood pressure 122/71, pulse 82, temperature 98.6 F (37 C), temperature source Oral, resp. rate 20, height '5\' 6"'$  (1.676 m), weight 86.2 kg (190 lb), SpO2 96 %.Body mass index is 30.67 kg/m.  General Appearance: Casual  Eye Contact:  Poor  Speech:  Blocked  Volume:  Decreased  Mood:  Depressed, Hopeless and Worthless  Affect:  Blunt  Thought Process:  Goal Directed and Descriptions of Associations: Intact  Orientation:  Full (Time, Place, and Person)  Thought Content:  WDL  Suicidal Thoughts:  Yes.  with intent/plan  Homicidal Thoughts:  No  Memory:  Immediate;   Fair Recent;   Fair Remote;   Fair  Judgement:  Impaired  Insight:  Shallow  Psychomotor Activity:  Psychomotor Retardation  Concentration:  Concentration: Fair and Attention Span: Fair  Recall:  [employer] of Knowledge:  Fair  Language:  Fair  Akathisia:  No  Handed:  Right  AIMS (if indicated):     Assets:  Communication Skills Desire for Improvement Financial Resources/Insurance Resilience  ADL's:  Intact  Cognition:  WNL  Sleep:  Number of Hours: 4.5      Treatment Plan Summary: Daily contact with patient to assess and evaluate symptoms and progress in treatment and Medication management   Valerie Vazquez is a 60 year old female with a history of schizoaffective disorder admitted here for worsening of depression and suicidal ideation.  1. Suicidal ideation. The patient is able to contract for safety in the hospital.  2. Mood and psychosis. She was started on Zyprexa 5 mg at night. We will increase dose to 10 mg.  3. COPD. She's on albuterol.  4. Hypertension and coronary artery disease she is on Coreg, Cardizem, Lasix, Imdur, and potassium.  5. Chronic pain. She is on Neurontin, oxycodone, and Flexeril.   6. IBS. She is on Bentyl and Protonix.   7. History of pulmonary embolism. She is on a Eliquis.  8. Metabolic syndrome monitoring. Cholesterol is elevated, TSH is normal, hemoglobin A1c pending.   9. Fall risk. We will ask for PT assessment.   10. Social. The patient has disability but no Medicaid. She is homeless. Her car is at the doctor's office. There is a risk that it will be towed.  11. Insomnia. Trazodone is available.   12. Disposition. TBE. She will follow up with [facility].    Orson Slick, MD 08/29/2016, 10:10 AM

## 2016-08-29 NOTE — Plan of Care (Signed)
Problem: Safety: Goal: Ability to remain free from injury will improve Outcome: Progressing Patient has remained free from injury during this shift.   

## 2016-08-29 NOTE — Tx Team (Signed)
Interdisciplinary Treatment and Diagnostic Plan Update  08/29/2016 Time of Session: 10:30 AM Hector Allain MRN: [id number]  Principal Diagnosis: Schizoaffective disorder, depressive type ([place])  Secondary Diagnoses: Principal Problem:   Schizoaffective disorder, depressive type ([place]) Active Problems:   Essential hypertension, benign   IBS (irritable bowel syndrome)   History of DVT (deep vein thrombosis)   Chronic pain   Musculoskeletal pain   Current Medications:  Current Facility-Administered Medications  Medication Dose Route Frequency Provider Last Rate Last Dose  . acetaminophen (TYLENOL) tablet 650 mg  650 mg Oral Q6H PRN Gonzella Lex, MD      . albuterol (PROVENTIL HFA;VENTOLIN HFA) 108 (90 Base) MCG/ACT inhaler 2 puff  2 puff Inhalation Q4H PRN Gonzella Lex, MD   2 puff at 08/29/16 0043  . alum & mag hydroxide-simeth (MAALOX/MYLANTA) 200-200-20 MG/5ML suspension 30 mL  30 mL Oral Q4H PRN Gonzella Lex, MD      . apixaban (ELIQUIS) tablet 5 mg  5 mg Oral BID Gonzella Lex, MD      . carvedilol (COREG) tablet 3.125 mg  3.125 mg Oral BID WC Gonzella Lex, MD      . cyclobenzaprine (FLEXERIL) tablet 10 mg  10 mg Oral Q6H PRN Gonzella Lex, MD   10 mg at 08/28/16 2358  . dicyclomine (BENTYL) tablet 20 mg  20 mg Oral TID WC PRN Gonzella Lex, MD      . diltiazem (CARDIZEM SR) 12 hr capsule 60 mg  60 mg Oral Q12H John T Clapacs, MD      . escitalopram (LEXAPRO) tablet 20 mg  20 mg Oral Daily John T Clapacs, MD      . fluticasone (FLONASE) 50 MCG/ACT nasal spray 2 spray  2 spray Each Nare Daily John T Clapacs, MD      . furosemide (LASIX) tablet 20 mg  20 mg Oral Daily Gonzella Lex, MD      . gabapentin (NEURONTIN) tablet 300 mg  300 mg Oral TID Gonzella Lex, MD   300 mg at 08/28/16 2358  . guaiFENesin (ROBITUSSIN) 100 MG/5ML solution 300 mg  15 mL Oral Q6H PRN Gonzella Lex, MD   300 mg at 08/28/16 2349  . isosorbide mononitrate (IMDUR) 24 hr tablet 30 mg  30  mg Oral Daily John T Clapacs, MD      . magnesium hydroxide (MILK OF MAGNESIA) suspension 30 mL  30 mL Oral Daily PRN Gonzella Lex, MD      . OLANZapine (ZYPREXA) tablet 5 mg  5 mg Oral QHS Gonzella Lex, MD   5 mg at 08/28/16 2357  . oxyCODONE-acetaminophen (PERCOCET/ROXICET) 5-325 MG per tablet 1 tablet  1 tablet Oral Q6H PRN Gonzella Lex, MD      . oxymetazoline (AFRIN) 0.05 % nasal spray 1 spray  1 spray Each Nare BID Gonzella Lex, MD   1 spray at 08/29/16 0003  . pantoprazole (PROTONIX) EC tablet 40 mg  40 mg Oral Daily John T Clapacs, MD      . potassium chloride SA (K-DUR,KLOR-CON) CR tablet 10 mEq  10 mEq Oral Daily Gonzella Lex, MD      . traZODone (DESYREL) tablet 50 mg  50 mg Oral QHS Gonzella Lex, MD   50 mg at 08/28/16 2357   PTA Medications: Prescriptions Prior to Admission  Medication Sig Dispense Refill Last Dose  . albuterol (PROVENTIL HFA;VENTOLIN HFA) 108 (90 Base) MCG/ACT inhaler  Inhale 2 puffs into the lungs every 6 (six) hours as needed for wheezing or shortness of breath. 1 Inhaler 2 prn at prn  . apixaban (ELIQUIS) 5 MG TABS tablet Take 1 tablet (5 mg total) by mouth 2 (two) times daily. 30 tablet 0 unknown at unknown  . benzonatate (TESSALON) 100 MG capsule Take 100 mg by mouth 2 (two) times daily as needed for cough.   unknown at unknown  . calcium carbonate (TUMS - DOSED IN MG ELEMENTAL CALCIUM) 500 MG chewable tablet Chew 1 tablet by mouth daily as needed for indigestion or heartburn.   prn at prn  . carvedilol (COREG) 3.125 MG tablet Take 1 tablet (3.125 mg total) by mouth 2 (two) times daily with a meal. 30 tablet 0 unknown at unknown  . dicyclomine (BENTYL) 10 MG capsule TAKE 2 CAPSULES BY MOUTH EVERY SIX HOURS (Patient taking differently: TAKE 1 CAPSULES BY MOUTH THREE TIMES DAILY AS NEEDED FOR SPASMS.) 240 capsule 5 unknown at unknown  . dicyclomine (BENTYL) 20 MG tablet Take 1 tablet (20 mg total) by mouth 3 (three) times daily as needed for spasms.  (Patient not taking: Reported on 08/28/2016) 30 tablet 0 Not Taking at Unknown time  . diltiazem (CARDIZEM SR) 60 MG 12 hr capsule TAKE 1 CAPSULE (60 MG TOTAL) BY MOUTH EVERY 6 (SIX) HOURS AS NEEDED. 120 capsule 3 unknown at unknown  . diphenhydrAMINE (BENADRYL) 25 MG tablet Take 1-2 tablets (25-50 mg total) by mouth every 6 (six) hours as needed for itching ([place]). 30 tablet 0 prn at prn  . EPINEPHrine 0.3 mg/0.3 mL IJ SOAJ injection Inject 0.3 mLs (0.3 mg total) into the muscle once. 1 Device 0 prn at prn  . escitalopram (LEXAPRO) 10 MG tablet Take 1 tablet (10 mg total) by mouth daily. 30 tablet 0 unknown at unknown  . furosemide (LASIX) 20 MG tablet TAKE 1 TABLET BY MOUTH EVERY DAY (Patient taking differently: TAKE 1 TABLET BY MOUTH EVERY DAY prn) 90 tablet 2 prn at prn  . gabapentin (NEURONTIN) 600 MG tablet Take 0.5 tablets (300 mg total) by mouth every 8 (eight) hours. 30 tablet 0 unknown at unknown  . isosorbide mononitrate (IMDUR) 30 MG 24 hr tablet Take 1 tablet (30 mg total) by mouth daily. 30 tablet 0 unknown at unknown  . Multiple Vitamin (MULTIVITAMIN WITH MINERALS) TABS tablet Take 2 tablets by mouth daily.    unknown at unknown  . mupirocin ointment (BACTROBAN) 2 % Place 1 application into the nose 3 (three) times daily. 22 g 0 prn at prn  . ondansetron (ZOFRAN) 4 MG tablet Take 1 tablet (4 mg total) by mouth every 8 (eight) hours as needed for nausea or vomiting. 30 tablet 0 prn at prn  . oxyCODONE-acetaminophen (PERCOCET/ROXICET) 5-325 MG tablet Take 1 tablet by mouth 2 (two) times daily as needed for severe pain. (Patient not taking: Reported on 08/28/2016) 30 tablet 0 Completed Course at Unknown time  . pantoprazole (PROTONIX) 40 MG tablet TAKE ONE TABLET BY MOUTH EVERY DAY 30 tablet 5 unknown at unknown  . potassium chloride (K-DUR) 10 MEQ tablet Take 1 tablet (10 mEq total) by mouth 2 (two) times daily. (Patient taking differently: Take 10 mEq by mouth 2 (two) times daily as  needed. ) 60 tablet 11 prn at prn  . traZODone (DESYREL) 50 MG tablet Take 1 tablet (50 mg total) by mouth at bedtime. (Patient taking differently: Take 50 mg by mouth at bedtime as needed for sleep. )  30 tablet 5 prn at prn  . triamcinolone (NASACORT AQ) 55 MCG/ACT AERO nasal inhaler Place 2 sprays into the nose daily. (Patient not taking: Reported on 08/28/2016) 1 Inhaler 12 Not Taking at Unknown time    Patient Stressors: Health problems Medication change or noncompliance  Patient Strengths: Average or above average intelligence Capable of independent living Communication skills  Treatment Modalities: Medication Management, Group therapy, Case management,  1 to 1 session with clinician, Psychoeducation, Recreational therapy.   Physician Treatment Plan for Primary Diagnosis: Schizoaffective disorder, depressive type ([place]) Long Term Goal(s): Improvement in symptoms so as ready for discharge Improvement in symptoms so as ready for discharge   Short Term Goals: Ability to verbalize feelings will improve Ability to disclose and discuss suicidal ideas Ability to identify and develop effective coping behaviors will improve Ability to maintain clinical measurements within normal limits will improve  Medication Management: Evaluate patient's response, side effects, and tolerance of medication regimen.  Therapeutic Interventions: 1 to 1 sessions, Unit Group sessions and Medication administration.  Evaluation of Outcomes: Progressing  Physician Treatment Plan for Secondary Diagnosis: Principal Problem:   Schizoaffective disorder, depressive type ([place]) Active Problems:   Essential hypertension, benign   IBS (irritable bowel syndrome)   History of DVT (deep vein thrombosis)   Chronic pain   Musculoskeletal pain  Long Term Goal(s): Improvement in symptoms so as ready for discharge Improvement in symptoms so as ready for discharge   Short Term Goals: Ability to verbalize feelings will  improve Ability to disclose and discuss suicidal ideas Ability to identify and develop effective coping behaviors will improve Ability to maintain clinical measurements within normal limits will improve     Medication Management: Evaluate patient's response, side effects, and tolerance of medication regimen.  Therapeutic Interventions: 1 to 1 sessions, Unit Group sessions and Medication administration.  Evaluation of Outcomes: Progressing   RN Treatment Plan for Primary Diagnosis: Schizoaffective disorder, depressive type ([place]) Long Term Goal(s): Knowledge of disease and therapeutic regimen to maintain health will improve  Short Term Goals: Ability to remain free from injury will improve, Ability to demonstrate self-control, Ability to disclose and discuss suicidal ideas, Ability to identify and develop effective coping behaviors will improve and Compliance with prescribed medications will improve  Medication Management: RN will administer medications as ordered by provider, will assess and evaluate patient's response and provide education to patient for prescribed medication. RN will report any adverse and/or side effects to prescribing provider.  Therapeutic Interventions: 1 on 1 counseling sessions, Psychoeducation, Medication administration, Evaluate responses to treatment, Monitor vital signs and CBGs as ordered, Perform/monitor CIWA, COWS, AIMS and Fall Risk screenings as ordered, Perform wound care treatments as ordered.  Evaluation of Outcomes: Progressing   LCSW Treatment Plan for Primary Diagnosis: Schizoaffective disorder, depressive type ([place]) Long Term Goal(s): Safe transition to appropriate next level of care at discharge, Engage patient in therapeutic group addressing interpersonal concerns.  Short Term Goals: Engage patient in aftercare planning with referrals and resources, Increase social support, Increase ability to appropriately verbalize feelings, Facilitate patient  progression through stages of change regarding substance use diagnoses and concerns and Increase skills for wellness and recovery  Therapeutic Interventions: Assess for all discharge needs, 1 to 1 time with Social worker, Explore available resources and support systems, Assess for adequacy in community support network, Educate family and significant other(s) on suicide prevention, Complete Psychosocial Assessment, Interpersonal group therapy.  Evaluation of Outcomes: Progressing   Progress in Treatment: Attending groups: No. Participating in  groups: No. Taking medication as prescribed: Yes. Toleration medication: Yes. Family/Significant other contact made: No, will contact:  CSW still assessing proper contacts. Patient understands diagnosis: Yes. Discussing patient identified problems/goals with staff: Yes. Medical problems stabilized or resolved: Yes. Denies suicidal/homicidal ideation: No. Issues/concerns per patient self-inventory: No.  New problem(s) identified: No, Describe:  No identified.  New Short Term/Long Term Goal(s):  Discharge Plan or Barriers:   Reason for Continuation of Hospitalization: Depression Suicidal ideation Other; describe Chronic pain  Estimated Length of Stay: 5-7 days   Attendees: Patient: Valerie Vazquez 08/29/2016 10:53 AM  Physician: Dr. Orson Slick, MD  08/29/2016 10:53 AM  Nursing: Elige Radon, RN  08/29/2016 10:53 AM  RN Care Manager: 08/29/2016 10:53 AM  Social Worker: Glorious Peach, MSW, LCSW-A 08/29/2016 10:53 AM   Scribe for Treatment Team: Emilie Rutter, [place] 08/29/2016 10:53 AM

## 2016-08-29 NOTE — [facility] Group Notes (Signed)
[facility] Group Notes:  (Nursing/MHT/Case Management/Adjunct)  Date:  08/29/2016  Time:  1:03 AM  Type of Therapy:  Group Therapy  Participation Level:  Did Not Attend    Marylynn Pearson 08/29/2016, 1:03 AM

## 2016-08-29 NOTE — [facility] Group Notes (Signed)
[employer] [facility] Notes:  (Nursing/MHT/Case Management/Adjunct)  Date:  08/29/2016  Time:  4:04 PM  Type of Therapy:  Psychoeducational Skills  Participation Level:  Did Not Attend    Drake Leach 08/29/2016, 4:04 PM

## 2016-08-30 ENCOUNTER — Inpatient Hospital Stay: Payer: Medicare Other

## 2016-08-30 LAB — HEMOGLOBIN A1C
Hgb A1c MFr Bld: 5.9 % — ABNORMAL HIGH (ref 4.8–5.6)
Mean Plasma Glucose: 123 mg/dL

## 2016-08-30 MED ORDER — LEVOFLOXACIN 500 MG PO TABS
250.0000 mg | ORAL_TABLET | Freq: Every day | ORAL | Status: AC
  Administered 2016-08-30 – 2016-09-03 (×5): 250 mg via ORAL
  Filled 2016-08-30 (×6): qty 1

## 2016-08-30 NOTE — Progress Notes (Signed)
D: Patient is alert and oriented on the unit this shift. Patient not attended and actively participated in groups today. Patient denies suicidal ideation, homicidal ideation, auditory or visual hallucinations at the present time.  A: Scheduled medications are administered to patient as per MD orders. Emotional support and encouragement are provided. Patient is maintained on q.15 minute safety checks. Patient is informed to notify staff with questions or concerns. R: No adverse medication reactions are noted. Patient is cooperative with medication administration  Patient is receptive,  anxious and cooperative on the unit at this time. Patient isolates   on the unit this shift. Patient contracts for safety at this time. Patient remains safe at this time. Depression 8/10 Anxiety 8/10

## 2016-08-30 NOTE — [facility] Suicide Risk Assessment (Signed)
[facility] INPATIENT:  Family/Significant Other Suicide Prevention Education  Suicide Prevention Education:  Patient Refusal for Family/Significant Other Suicide Prevention Education: The patient Valerie Vazquez has refused to provide written consent for family/significant other to be provided Family/Significant Other Suicide Prevention Education during admission and/or prior to discharge.  Physician notified.  Valerie Vazquez, [place] 08/30/2016, 11:25 AM

## 2016-08-30 NOTE — Consult Note (Signed)
Valerie Vazquez at [place] NAME: Valerie Vazquez    MR#:  [id number]  DATE OF BIRTH:  24-Sep-1955  DATE OF CONSULT:  08/30/2016  PRIMARY CARE PHYSICIAN: [name], MD   REQUESTING/REFERRING PHYSICIAN: Dr. Bary Leriche   CHIEF COMPLAINT:  No chief complaint on file. cough, non-productive, Left lower ext pain.   HISTORY OF PRESENT ILLNESS:  Valerie Vazquez  is a 60 y.o. female with a known history of Hypertension, depression, or bowel syndrome, chronic anemia, history of recurrent pulmonary embolism, coronary disease status post previous MI, who was admitted to behavioral medicine for suicidal ideations but for the past few days has been having a cough which has been intermittently productive along with a low-grade fever and also having left lower extremity pain. Patient denies any hemoptysis, but admits to shortness of breath even at rest. No nausea, vomiting, abdominal pain, diarrhea or any other associated symptoms. Clinically she does not appear short of breath when talking. She has previous hx of DVT/PE and says that she has not been compliant with her Eliquis   PAST MEDICAL HISTORY:   Past Medical History:  Diagnosis Date  . Anemia   . Bilateral renal cysts   . Chronic back pain    "upper and lower" (09/19/2014)  . Depression    "major" (09/19/2014)  . DVT (deep venous thrombosis) ([place])   . Esophageal spasm   . Gallstones   . Gastritis   . Gastroenteritis   . GERD (gastroesophageal reflux disease)   . Headache    "couple /month lately" (09/19/2014)  . High blood pressure   . High cholesterol   . History of blood transfusion 1985   related to hysterectomy  . Hypertension   . IBS (irritable bowel syndrome)   . Migraine    "a few times/yr" (09/19/2014)  . Myocardial infarction 10/2010 X 3   "while hospitalized"  . Osteoarthritis    "qwhere; mostly around my joints" (09/19/2014)  . Pneumonia 04/2014  . PTSD (post-traumatic stress  disorder)   . Pulmonary embolism ([place]) 04/2001; 09/19/2014   "after gallbladder OR; "  . Schizoaffective disorder ([place])   . Sleep apnea    "mild" (09/19/2014)  . Snoring    a. sleep study 5/16: No OSA  . Spinal stenosis   . Spondylosis   . Type II diabetes mellitus ([place])    "dx'd in 2007; lost weight; no RX for ~ 4 yr now" (09/19/2014)    PAST SURGICAL HISTOIRY:   Past Surgical History:  Procedure Laterality Date  . ABDOMINAL HYSTERECTOMY  1985  . ANTERIOR CERVICAL DECOMP/DISCECTOMY FUSION  10/2012  . APPENDECTOMY  1985  . BACK SURGERY    . BREAST CYST EXCISION Right   . BREAST EXCISIONAL BIOPSY Right YRS AGO   NEG  . CARDIAC CATHETERIZATION   2000's; 2009  . CHOLECYSTECTOMY  2002  . EXCISION/RELEASE BURSA HIP Right 12/1984  . HERNIA REPAIR  1985  . LEFT HEART CATHETERIZATION WITH CORONARY ANGIOGRAM N/A 12/09/2014   Procedure: LEFT HEART CATHETERIZATION WITH CORONARY ANGIOGRAM;  Surgeon: Burnell Blanks, MD;  Location: [facility];  Service: Cardiovascular;  Laterality: N/A;  . TONSILLECTOMY AND ADENOIDECTOMY  ~ 1968  . TOTAL HIP ARTHROPLASTY Left 04-06-2014  . UMBILICAL HERNIA REPAIR  1985  . VENA CAVA FILTER PLACEMENT  03/2014    SOCIAL HISTORY:   Social History  Substance Use Topics  . Smoking status: Former Smoker    Packs/day: 1.50  Years: 10.00    Types: Cigarettes    Quit date: 04/12/1979  . Smokeless tobacco: Never Used  . Alcohol use No    FAMILY HISTORY:   Family History  Problem Relation Age of Onset  . Stroke Mother     Deceased  . Lung cancer Father     Deceased  . Healthy Daughter   . Healthy Son     x 2  . Other Son     Suicide  . Heart attack Neg Hx     DRUG ALLERGIES:   Allergies  Allergen Reactions  . Abilify [Aripiprazole] Other (See Comments)    Jerking movement, slow motor skills  . Augmentin [Amoxicillin-Pot Clavulanate] Diarrhea and Nausea And Vomiting  . Bactrim [Sulfamethoxazole-Trimethoprim] Diarrhea  . Ceftin  [Cefuroxime Axetil] Diarrhea  . Coconut Oil     Rash   . Diclofenac Other (See Comments)     Muscle Pain  . Dye Fdc Red [Red Dye]     "hair dye"  . Indocin [Indomethacin] Other (See Comments)    Migraines   . Linaclotide Diarrhea  . Lisinopril Diarrhea and Other (See Comments)    Other reaction(s): Dizziness, Vomiting  . Morphine Other (See Comments)    Chest Tightness  . Morphine And Related Other (See Comments)    Chest tightness   . Simvastatin Other (See Comments)    Muscle Pain  . Sulfa Antibiotics Diarrhea  . Sulfamethoxazole-Trimethoprim Diarrhea  . Wellbutrin [Bupropion] Other (See Comments)    Jerking movement Slurred speech  . Quetiapine Fumarate Palpitations    REVIEW OF SYSTEMS:   Review of Systems  Constitutional: Negative for fever and weight loss.  HENT: Negative for congestion, nosebleeds and tinnitus.   Eyes: Negative for blurred vision, double vision and redness.  Respiratory: Positive for cough and shortness of breath. Negative for hemoptysis.   Cardiovascular: Negative for chest pain, orthopnea, leg swelling and PND.  Gastrointestinal: Negative for abdominal pain, diarrhea, melena, nausea and vomiting.  Genitourinary: Negative for dysuria, hematuria and urgency.  Musculoskeletal: Negative for falls and joint pain.  Neurological: Negative for dizziness, tingling, sensory change, focal weakness, seizures, weakness and headaches.  Endo/Heme/Allergies: Negative for polydipsia. Does not bruise/bleed easily.  Psychiatric/Behavioral: Negative for depression and memory loss. The patient is not nervous/anxious.      MEDICATIONS AT HOME:   Prior to Admission medications   Medication Sig Start Date End Date Taking? Authorizing Provider  albuterol (PROVENTIL HFA;VENTOLIN HFA) 108 (90 Base) MCG/ACT inhaler Inhale 2 puffs into the lungs every 6 (six) hours as needed for wheezing or shortness of breath. 08/15/16   Nena Polio, MD  apixaban (ELIQUIS) 5 MG TABS  tablet Take 1 tablet (5 mg total) by mouth 2 (two) times daily. 08/15/16   Nena Polio, MD  benzonatate (TESSALON) 100 MG capsule Take 100 mg by mouth 2 (two) times daily as needed for cough.    Historical Provider, MD  calcium carbonate (TUMS - DOSED IN MG ELEMENTAL CALCIUM) 500 MG chewable tablet Chew 1 tablet by mouth daily as needed for indigestion or heartburn.    Historical Provider, MD  carvedilol (COREG) 3.125 MG tablet Take 1 tablet (3.125 mg total) by mouth 2 (two) times daily with a meal. 08/15/16   Nena Polio, MD  dicyclomine (BENTYL) 10 MG capsule TAKE 2 CAPSULES BY MOUTH EVERY SIX HOURS Patient taking differently: TAKE 1 CAPSULES BY MOUTH THREE TIMES DAILY AS NEEDED FOR SPASMS. 04/09/15   [name], MD  dicyclomine (BENTYL) 20 MG tablet Take 1 tablet (20 mg total) by mouth 3 (three) times daily as needed for spasms. Patient not taking: Reported on 08/28/2016 04/11/16 04/11/17  Earleen Newport, MD  diltiazem (CARDIZEM SR) 60 MG 12 hr capsule TAKE 1 CAPSULE (60 MG TOTAL) BY MOUTH EVERY 6 (SIX) HOURS AS NEEDED. 06/30/16   Manus Gunning, MD  diphenhydrAMINE (BENADRYL) 25 MG tablet Take 1-2 tablets (25-50 mg total) by mouth every 6 (six) hours as needed for itching (Rash). 07/14/15   Antonietta Breach, PA-C  EPINEPHrine 0.3 mg/0.3 mL IJ SOAJ injection Inject 0.3 mLs (0.3 mg total) into the muscle once. 10/12/15   [name], MD  escitalopram (LEXAPRO) 10 MG tablet Take 1 tablet (10 mg total) by mouth daily. 08/15/16   Nena Polio, MD  furosemide (LASIX) 20 MG tablet TAKE 1 TABLET BY MOUTH EVERY DAY Patient taking differently: TAKE 1 TABLET BY MOUTH EVERY DAY prn 08/14/15   [name], MD  gabapentin (NEURONTIN) 600 MG tablet Take 0.5 tablets (300 mg total) by mouth every 8 (eight) hours. 08/22/16   [name], MD  isosorbide mononitrate (IMDUR) 30 MG 24 hr tablet Take 1 tablet (30 mg total) by mouth daily. 08/15/16   Nena Polio, MD  Multiple Vitamin  (MULTIVITAMIN WITH MINERALS) TABS tablet Take 2 tablets by mouth daily.     Historical Provider, MD  mupirocin ointment (BACTROBAN) 2 % Place 1 application into the nose 3 (three) times daily. 04/16/16   [name], MD  ondansetron (ZOFRAN) 4 MG tablet Take 1 tablet (4 mg total) by mouth every 8 (eight) hours as needed for nausea or vomiting. 08/22/16   [name], MD  oxyCODONE-acetaminophen (PERCOCET/ROXICET) 5-325 MG tablet Take 1 tablet by mouth 2 (two) times daily as needed for severe pain. Patient not taking: Reported on 08/28/2016 02/18/16   [name], MD  pantoprazole (PROTONIX) 40 MG tablet TAKE ONE TABLET BY MOUTH EVERY DAY 09/03/15   [name], MD  potassium chloride (K-DUR) 10 MEQ tablet Take 1 tablet (10 mEq total) by mouth 2 (two) times daily. Patient taking differently: Take 10 mEq by mouth 2 (two) times daily as needed.  10/22/15   Burnell Blanks, MD  traZODone (DESYREL) 50 MG tablet Take 1 tablet (50 mg total) by mouth at bedtime. Patient taking differently: Take 50 mg by mouth at bedtime as needed for sleep.  04/24/15   [name], MD  triamcinolone (NASACORT AQ) 55 MCG/ACT AERO nasal inhaler Place 2 sprays into the nose daily. Patient not taking: Reported on 08/28/2016 10/02/15   Wilhelmina Mcardle, MD      VITAL SIGNS:  Blood pressure 111/63, pulse 84, temperature 99.2 F (37.3 C), resp. rate 18, height 5\' 6"  (1.676 m), weight 86.2 kg (190 lb), SpO2 96 %.  PHYSICAL EXAMINATION:  GENERAL:  60 y.o.-year-old patient lying in the bed in no Resp. distress.  EYES: Pupils equal, round, reactive to light and accommodation. No scleral icterus. Extraocular muscles intact.  HEENT: Head atraumatic, normocephalic. Oropharynx and nasopharynx clear.  NECK:  Supple, no jugular venous distention. No thyroid enlargement, no tenderness.  LUNGS: Normal breath sounds bilaterally, no wheezing, rhonchi, bibasilar rales . No use of accessory muscles of respiration.   CARDIOVASCULAR: S1, S2, RRR. No murmurs, rubs, gallops, clicks.  ABDOMEN: Soft, nontender, nondistended. Bowel sounds present. No organomegaly or mass.  EXTREMITIES: No pedal edema, cyanosis, or clubbing.  NEUROLOGIC: Cranial nerves II through  XII are intact. No focal motor or sensory deficits appreciated bilaterally  PSYCHIATRIC: The patient is alert and oriented x 3. Flat affect SKIN: No obvious rash, lesion, or ulcer.   LABORATORY PANEL:   CBC  Recent Labs Lab 08/27/16 1806  WBC 7.6  HGB 12.7  HCT 38.1  PLT 408   ------------------------------------------------------------------------------------------------------------------  Chemistries   Recent Labs Lab 08/27/16 1806  NA 139  K 4.5  CL 104  CO2 27  GLUCOSE 118*  BUN 13  CREATININE 0.99  CALCIUM 10.0  AST 28  ALT 22  ALKPHOS 111  BILITOT 0.5   ------------------------------------------------------------------------------------------------------------------  Cardiac Enzymes No results for input(s): TROPONINI in the last 168 hours. ------------------------------------------------------------------------------------------------------------------  RADIOLOGY:  Dg Chest 2 View  Result Date: 08/30/2016 CLINICAL DATA:  Productive cough for 1 month. Fever. History of hypertension and pulmonary embolism. Former smoker. EXAM: CHEST  2 VIEW COMPARISON:  08/25/2016; 08/15/2016; chest CT - 08/16/2016 FINDINGS: Grossly unchanged cardiac silhouette and mediastinal contours. Interval development of bilateral infrahilar interstitial opacities with development of trace bilateral effusions. No evidence of edema. No pneumothorax. Unchanged bones. Post lower cervical ACDF, incompletely evaluated. Post cholecystectomy. Post IVC filter placement. IMPRESSION: Findings worrisome for airways disease / bronchitis, though note, atypical infection could have a similar appearance, especially in the setting of trace pleural effusions. A  follow-up chest radiograph in 3 to 4 weeks after treatment is recommended to ensure resolution. Electronically Signed   By: Sandi Mariscal M.D.   On: 08/30/2016 10:52     IMPRESSION AND PLAN:   60 year old female with past medical history of hypertension, previous history of MI, coronary disease, previous history of recurrent PE, depression, irritable bowel syndrome, as noted to the hospital in the behavioral medicine for suicidal ideations and hospitalist services were contacted for cough, shortness of breath and left lower extremity pain.   1. Shortness of breath cough-secondary to acute bronchitis. -Clinically does not appear hypoxic. We'll treat the patient with Levaquin for a few days. -Also given incentive spirometer.  2. Left lower extremity pain-etiology unclear. Patient has a previous history of recurrent DVT and PE. She is already on Eliquis. Although she says she is noncompliant. -I will get Dopplers of her lower extremity to rule out DVT.  3. Essential hypertension-continue carvedilol, Cardizem, Imdur.  4. Depression-continue Lexapro, continue further care as per psychiatry.  5. Neuropathy-continue gabapentin.  6. History of irritable bowel syndrome-continue Bentyl.   All the records are reviewed and case discussed with Consulting provider. Management plans discussed with the patient, family and they are in agreement.  CODE STATUS: Full code  TOTAL TIME TAKING CARE OF THIS PATIENT: 45 minutes.    Henreitta Leber M.D on 08/30/2016 at 3:08 PM  Between 7am to 6pm - Pager - [phone]  After 6pm go to [email or web address] - password [email or web address] [facility]  [facility]  Office  [phone]  CC: Primary care Physician: [name], MD

## 2016-08-30 NOTE — Progress Notes (Signed)
D: Pt denies SI/HI/AVH, affect is flat and sad, she appears tired and restless. Patient was also noted to have a productive cough. Patient's VS was checked WNL afebrile, 96% on room air, some crackles noted via ascultation to bilateral lower lobes, patient was given a cough medication and other nighttime medication and encouraged to increase fluids. Pt appears less anxious, isolates to room not  interacting with peers and staff.  A: Pt was offered support and encouragement. Pt was given scheduled medications. Pt was encouraged to attend groups. Q 15 minute checks were done for safety.  R:Pt did not attend group.  Pt is complaint with medication. Pt has no complaints.Pt receptive to treatment and safety maintained on unit, will continue to closely monitor.

## 2016-08-30 NOTE — Plan of Care (Signed)
Problem: Coping: Goal: Ability to verbalize feelings will improve Outcome: Progressing Patient verbalized feelings to staff.    

## 2016-08-30 NOTE — Progress Notes (Signed)
PT Cancellation Note  Patient Details Name: Valerie Vazquez MRN: [id number] DOB: Sep 25, 1955   Cancelled Treatment:    Reason Eval/Treat Not Completed: Medical issues which prohibited therapy; Upon entering pt's room for PT evaluation pt reported significant pain to the L calf area "for the last do or two".  Homan's test elicited strong positive response, nursing notified and PT eval deferred until pt cleared from a medical perspective.   Linus Salmons PT, DPT 08/30/16, 1:27 PM

## 2016-08-30 NOTE — Progress Notes (Addendum)
[facility] MD Progress Note  08/30/2016 2:35 PM Valerie Vazquez  MRN:  [id number]  Subjective:    12/29 Valerie Vazquez met with treatment team today. She appears severely depressed with severe psychomotor retardation. She is moving very slowly as if in pain. There is severe psychomotor retardation. She wispers her answers and is very difficult to understand. She discloses that her boyfriend, Valerie Vazquez, died unexpectedly 6 months ago, that she lost her housing and was forced to move in with her neighbor who no longer wants her in the apartment because she is so sick. Her plan was to live in a car where she already deposited Valerie Vazquez's ashes. She came to the hospital suicidal and continues to be so. According to the nursing note the patient has been low by and verbally aggressive. She carries diagnosis of schizoaffective disorder. She was started on Zyprexa and seems to tolerate medications well.  12/30 Valerie Vazquez does not feel well physically. She has been running low grade fever and coughing productive cough. Chest Xray shows bronchial disease. Spoke with medical consultant who recommended a course of Levaquin for 5 days as she is allergic to azithromycin. Her mood is still depressed, affect flat. She accepts medications and tolerates thm well. Cholesterol is slightly elevated. We switch to heart healthy diet. The patient has a history of pulmonary embolism and is on Eliquis. Per PT note, she complained of left calf pain for 2 days. I will ask medicine for a consult.  Per Nursing: D: Pt denies SI/HI/AVH, affect is flat and sad, she appears tired and restless. Patient was also noted to have a productive cough. Patient's VS was checked WNL afebrile, 96% on room air, some crackles noted via ascultation to bilateral lower lobes, patient was given a cough medication and other nighttime medication and encouraged to increase fluids. Pt appears less anxious, isolates to room not  interacting with peers and staff.   A: Pt was offered support and encouragement. Pt was given scheduled medications. Pt was encouraged to attend groups. Q 15 minute checks were done for safety.  R:Pt did not attend group.  Pt is complaint with medication. Pt has no complaints.Pt receptive to treatment and safety maintained on unit, will continue to closely monitor.   Principal Problem: Schizoaffective disorder, depressive type ([place]) Diagnosis:   Patient Active Problem List   Diagnosis Date Noted  . Schizoaffective disorder, depressive type ([place]) [F25.1] 08/28/2016  . Sinusitis, acute maxillary [J01.00] 08/27/2016  . Suicide attempt [T14.91XA] 08/27/2016  . Rash and nonspecific skin eruption [R21] 04/16/2016  . Colitis [K52.9] 04/08/2016  . Tremor [R25.1] 03/19/2016  . Left shoulder pain [M25.512] 02/18/2016  . Abdominal distension [R14.0] 02/18/2016  . Absolute anemia [D64.9] 01/29/2016  . Chronic pain [G89.29] 01/29/2016  . Neurogenic pain [M79.2] 01/29/2016  . Musculoskeletal pain [M79.1] 01/29/2016  . Long term current use of opiate analgesic [Z79.891] 01/29/2016  . Long term prescription opiate use [Z79.891] 01/29/2016  . Opiate use (15 MME/Day) [F11.90] 01/29/2016  . Encounter for therapeutic drug level monitoring [Z51.81] 01/29/2016  . Encounter for pain management planning [Z01.89] 01/29/2016  . Chronic neck pain (midline) [M54.2, G89.29] 01/29/2016  . Chronic chest wall pain [R07.89, G89.29] 01/29/2016  . Chronic low back pain (Location of Secondary source of pain) (Bilateral) (R>L) [M54.5, G89.29] 01/29/2016  . Disturbance of skin sensation [R20.9] 01/29/2016  . Chronic shoulder pain (Location of Primary Source of Pain) (Left) [M25.512, G89.29] 01/29/2016  . Chronic hip pain (Location of Tertiary source of pain) (Bilateral) (  R>L) [I78.676, G89.29] 01/29/2016  . Osteoarthritis of hip (Bilateral) [M16.0] 01/29/2016  . Lumbar facet arthropathy (Severe at L2-3, L3-4, L4-5, and L5-S1) [M12.88] 01/29/2016  . Lumbar  central spinal stenosis (moderate at L4-5; mild at L2-3 and L3-4) [M48.061] 01/29/2016  . Lumbar foraminal stenosis (L4-5) (Right) [M99.83] 01/29/2016  . Lumbar facet syndrome [M12.88] 01/29/2016  . Hx of cervical spine surgery [Z98.890] 01/29/2016  . Numbness [R20.0] 11/19/2015  . Right wrist pain [M25.531] 11/09/2015  . Breast pain [N64.4] 10/19/2015  . Chest pain [R07.9] 10/14/2015  . Headache [R51] 10/14/2015  . Abdominal pain [R10.9] 10/14/2015  . Depression [F32.9] 10/14/2015  . Lip swelling [R22.0] 10/14/2015  . Pulmonary embolism ([place]) [I26.99] 08/15/2015  . Bilateral pneumonia [J18.9] 08/15/2015  . Nausea [R11.0] 08/15/2015  . Shortness of breath [R06.02] 07/31/2015  . Pleural effusion, bilateral [J90] 07/31/2015  . Cough [R05] 07/31/2015  . Chronic pain of multiple sites [R52, G89.29] 06/05/2015  . Chronic lumbosacral pain [M54.5, G89.29] 06/05/2015  . Esophageal spasm [K22.4] 06/05/2015  . History of DVT (deep vein thrombosis) [Z86.718] 02/25/2015  . Hypercementosis [K03.4] 02/13/2015  . Constipation [K59.00] 12/12/2014  . Coronary vasospasm ([place]) [I20.1] 12/09/2014  . History of pulmonary embolus (PE) [Z86.711] 09/19/2014  . Impingement syndrome of shoulder [M75.40] 08/30/2014  . Impingement syndrome of left shoulder [M75.42] 08/30/2014  . Injury of superior glenoid labrum of shoulder joint [S43.439A] 08/16/2014  . Supraspinatus tenosynovitis [M65.819] 08/16/2014  . Cervical spondylosis without myelopathy [M47.812] 08/16/2014  . Anxiety and depression [F41.8] 04/13/2014  . Osteoarthritis of hip (Left) [M16.12] 04/11/2014  . S/P THR (total hip replacement) (Left) [H20.947] 04/11/2014  . IBS (irritable bowel syndrome) [K58.9] 04/11/2014  . GERD (gastroesophageal reflux disease) [K21.9] 04/11/2014  . Insomnia [G47.00] 04/11/2014  . Essential hypertension, benign [I10] 10/28/2013  . Left hip pain [M25.552] 10/28/2013  . Anemia [D64.9] 10/28/2013  . HLD (hyperlipidemia)  [E78.5] 10/28/2013   Total Time spent with patient: 20 minutes  Past Psychiatric History: Schizoaffective disorder.  Past Medical History:  Past Medical History:  Diagnosis Date  . Anemia   . Bilateral renal cysts   . Chronic back pain    "upper and lower" (09/19/2014)  . Depression    "major" (09/19/2014)  . DVT (deep venous thrombosis) ([place])   . Esophageal spasm   . Gallstones   . Gastritis   . Gastroenteritis   . GERD (gastroesophageal reflux disease)   . Headache    "couple /month lately" (09/19/2014)  . High blood pressure   . High cholesterol   . History of blood transfusion 1985   related to hysterectomy  . Hypertension   . IBS (irritable bowel syndrome)   . Migraine    "a few times/yr" (09/19/2014)  . Myocardial infarction 10/2010 X 3   "while hospitalized"  . Osteoarthritis    "qwhere; mostly around my joints" (09/19/2014)  . Pneumonia 04/2014  . PTSD (post-traumatic stress disorder)   . Pulmonary embolism ([place]) 04/2001; 09/19/2014   "after gallbladder OR; "  . Schizoaffective disorder ([place])   . Sleep apnea    "mild" (09/19/2014)  . Snoring    a. sleep study 5/16: No OSA  . Spinal stenosis   . Spondylosis   . Type II diabetes mellitus ([place])    "dx'd in 2007; lost weight; no RX for ~ 4 yr now" (09/19/2014)    Past Surgical History:  Procedure Laterality Date  . ABDOMINAL HYSTERECTOMY  1985  . ANTERIOR CERVICAL DECOMP/DISCECTOMY FUSION  10/2012  .  APPENDECTOMY  1985  . BACK SURGERY    . BREAST CYST EXCISION Right   . BREAST EXCISIONAL BIOPSY Right YRS AGO   NEG  . CARDIAC CATHETERIZATION   2000's; 2007-10-24  . CHOLECYSTECTOMY  October 23, 2000  . EXCISION/RELEASE BURSA HIP Right 12/1984  . HERNIA REPAIR  1985  . LEFT HEART CATHETERIZATION WITH CORONARY ANGIOGRAM N/A 12/09/2014   Procedure: LEFT HEART CATHETERIZATION WITH CORONARY ANGIOGRAM;  Surgeon: Burnell Blanks, MD;  Location[facility] CATH LAB;  Service: Cardiovascular;  Laterality: N/A;  . TONSILLECTOMY AND  ADENOIDECTOMY  ~ 1966-10-23  . TOTAL HIP ARTHROPLASTY Left 04-06-2014  . UMBILICAL HERNIA REPAIR  1985  . VENA CAVA FILTER PLACEMENT  03/2014   Family History:  Family History  Problem Relation Age of Onset  . Stroke Mother     Deceased  . Lung cancer Father     Deceased  . Healthy Daughter   . Healthy Son     x 2  . Other Son     Suicide  . Heart attack Neg Hx    Family Psychiatric  History: See H&P. Social History:  History  Alcohol Use No     History  Drug Use No    Social History   Social History  . Marital status: Legally Separated    Spouse name: N/A  . Number of children: N/A  . Years of education: N/A   Social History Main Topics  . Smoking status: Former Smoker    Packs/day: 1.50    Years: 10.00    Types: Cigarettes    Quit date: 04/12/1979  . Smokeless tobacco: Never Used  . Alcohol use No  . Drug use: No  . Sexual activity: Not Currently   Other Topics Concern  . None   Social History Narrative   Lives with fiancee in an apartment on the first floor.  Has 3 grown children.  One child passed away.  On disability Oct 23, 1994 - 2000, 2007 - current.     Education: some college.   Additional Social History:                         Sleep: Poor  Appetite:  Fair  Current Medications: Current Facility-Administered Medications  Medication Dose Route Frequency Provider Last Rate Last Dose  . acetaminophen (TYLENOL) tablet 650 mg  650 mg Oral Q6H PRN Gonzella Lex, MD   650 mg at 08/29/16 1152  . albuterol (PROVENTIL HFA;VENTOLIN HFA) 108 (90 Base) MCG/ACT inhaler 2 puff  2 puff Inhalation Q4H PRN Gonzella Lex, MD   2 puff at 08/29/16 0043  . alum & mag hydroxide-simeth (MAALOX/MYLANTA) 200-200-20 MG/5ML suspension 30 mL  30 mL Oral Q4H PRN Gonzella Lex, MD      . apixaban (ELIQUIS) tablet 5 mg  5 mg Oral BID Gonzella Lex, MD   5 mg at 08/30/16 [phone]  . carvedilol (COREG) tablet 3.125 mg  3.125 mg Oral BID WC Gonzella Lex, MD   3.125 mg at  08/30/16 6314  . cyclobenzaprine (FLEXERIL) tablet 10 mg  10 mg Oral Q6H PRN Gonzella Lex, MD   10 mg at 08/28/16 2358  . dicyclomine (BENTYL) tablet 20 mg  20 mg Oral TID WC PRN Gonzella Lex, MD      . diltiazem (CARDIZEM SR) 12 hr capsule 60 mg  60 mg Oral Q12H Gonzella Lex, MD   60 mg at 08/30/16 9702  .  escitalopram (LEXAPRO) tablet 20 mg  20 mg Oral Daily Gonzella Lex, MD   20 mg at 08/30/16 0837  . fluticasone (FLONASE) 50 MCG/ACT nasal spray 2 spray  2 spray Each Nare Daily Gonzella Lex, MD   2 spray at 08/30/16 6389  . furosemide (LASIX) tablet 20 mg  20 mg Oral Daily Gonzella Lex, MD   20 mg at 08/30/16 [phone]  . gabapentin (NEURONTIN) tablet 300 mg  300 mg Oral TID Gonzella Lex, MD   300 mg at 08/30/16 2876  . guaiFENesin (ROBITUSSIN) 100 MG/5ML solution 300 mg  15 mL Oral Q6H PRN Gonzella Lex, MD   300 mg at 08/30/16 0441  . isosorbide mononitrate (IMDUR) 24 hr tablet 30 mg  30 mg Oral Daily Gonzella Lex, MD   30 mg at 08/30/16 0837  . levofloxacin (LEVAQUIN) tablet 250 mg  250 mg Oral Daily Jolanta B Pucilowska, MD      . magnesium hydroxide (MILK OF MAGNESIA) suspension 30 mL  30 mL Oral Daily PRN Gonzella Lex, MD      . OLANZapine (ZYPREXA) tablet 5 mg  5 mg Oral QHS Gonzella Lex, MD   5 mg at 08/29/16 2202  . oxyCODONE-acetaminophen (PERCOCET/ROXICET) 5-325 MG per tablet 1 tablet  1 tablet Oral Q6H PRN Gonzella Lex, MD      . oxymetazoline (AFRIN) 0.05 % nasal spray 1 spray  1 spray Each Nare BID Gonzella Lex, MD   1 spray at 08/30/16 [phone]  . pantoprazole (PROTONIX) EC tablet 40 mg  40 mg Oral Daily Gonzella Lex, MD   40 mg at 08/30/16 7262  . potassium chloride SA (K-DUR,KLOR-CON) CR tablet 10 mEq  10 mEq Oral Daily Gonzella Lex, MD   10 mEq at 08/30/16 0837  . traZODone (DESYREL) tablet 100 mg  100 mg Oral QHS Clovis Fredrickson, MD   100 mg at 08/29/16 2202    Lab Results:  Results for orders placed or performed during the hospital encounter of  08/28/16 (from the past 48 hour(s))  Hemoglobin A1c     Status: Abnormal   Collection Time: 08/29/16  7:21 AM  Result Value Ref Range   Hgb A1c MFr Bld 5.9 (H) 4.8 - 5.6 %    Comment: (NOTE)         Pre-diabetes: 5.7 - 6.4         Diabetes: >6.4         Glycemic control for adults with diabetes: <7.0    Mean Plasma Glucose 123 mg/dL    Comment: (NOTE) Performed At: [facility] [place], [place] [id number] Lindon Romp MD [id number]   Lipid panel     Status: Abnormal   Collection Time: 08/29/16  7:21 AM  Result Value Ref Range   Cholesterol 251 (H) 0 - 200 mg/dL   Triglycerides 83 <150 mg/dL   HDL 38 (L) >40 mg/dL   Total CHOL/HDL Ratio 6.6 RATIO   VLDL 17 0 - 40 mg/dL   LDL Cholesterol 196 (H) 0 - 99 mg/dL    Comment:        Total Cholesterol/HDL:CHD Risk Coronary Heart Disease Risk Table                     Men   Women  1/2 Average Risk   3.4   3.3  Average Risk       5.0  4.4  2 X Average Risk   9.6   7.1  3 X Average Risk  23.4   11.0        Use the calculated Patient Ratio above and the CHD Risk Table to determine the patient's CHD Risk.        ATP III CLASSIFICATION (LDL):  <100     mg/dL   Optimal  100-129  mg/dL   Near or Above                    Optimal  130-159  mg/dL   Borderline  160-189  mg/dL   High  >190     mg/dL   Very High   TSH     Status: None   Collection Time: 08/29/16  7:21 AM  Result Value Ref Range   TSH 2.435 0.350 - 4.500 uIU/mL    Comment: Performed by a 3rd Generation assay with a functional sensitivity of <=0.01 uIU/mL.    Blood Alcohol level:  Lab Results  Component Value Date   ETH <5 30/86/5784    Metabolic Disorder Labs: Lab Results  Component Value Date   HGBA1C 5.9 (H) 08/29/2016   MPG 123 08/29/2016   MPG 140 (H) 09/19/2014   No results found for: PROLACTIN Lab Results  Component Value Date   CHOL 251 (H) 08/29/2016   TRIG 83 08/29/2016   HDL 38 (L) 08/29/2016   CHOLHDL 6.6  08/29/2016   VLDL 17 08/29/2016   LDLCALC 196 (H) 08/29/2016    Physical Findings: AIMS:  , ,  ,  ,    CIWA:    COWS:     Musculoskeletal: Strength & Muscle Tone: within normal limits Gait & Station: normal Patient leans: N/A  Psychiatric Specialty Exam: Physical Exam  Nursing note and vitals reviewed.   Review of Systems  Musculoskeletal: Positive for back pain.  Psychiatric/Behavioral: Positive for depression, hallucinations and suicidal ideas. The patient has insomnia.   All other systems reviewed and are negative.   Blood pressure 111/63, pulse 84, temperature 99.2 F (37.3 C), resp. rate 18, height _0  (1.676 m), weight 86.2 kg (190 lb), SpO2 96 %.Body mass index is 30.67 kg/m.  General Appearance: Casual  Eye Contact:  Poor  Speech:  Blocked  Volume:  Decreased  Mood:  Depressed, Hopeless and Worthless  Affect:  Blunt  Thought Process:  Goal Directed and Descriptions of Associations: Intact  Orientation:  Full (Time, Place, and Person)  Thought Content:  WDL  Suicidal Thoughts:  Yes.  with intent/plan  Homicidal Thoughts:  No  Memory:  Immediate;   Fair Recent;   Fair Remote;   Fair  Judgement:  Impaired  Insight:  Shallow  Psychomotor Activity:  Psychomotor Retardation  Concentration:  Concentration: Fair and Attention Span: Fair  Recall:  [employer] of Knowledge:  Fair  Language:  Fair  Akathisia:  No  Handed:  Right  AIMS (if indicated):     Assets:  Communication Skills Desire for Improvement Financial Resources/Insurance Resilience  ADL's:  Intact  Cognition:  WNL  Sleep:  Number of Hours: 6     Treatment Plan Summary: Daily contact with patient to assess and evaluate symptoms and progress in treatment and Medication management   Valerie Vazquez is a 60 year old female with a history of schizoaffective disorder admitted here for worsening of depression and suicidal ideation.  1. Suicidal ideation. The patient is able to contract for safety in  the hospital.  2. Mood and psychosis. She was started on Zyprexa 5 mg at night and Lexapro for depression and mood stabilization.   3. COPD. She's on albuterol. Abnormal chest Xray. We started Levaquin 250 mg daily for 5 days.  4. Hypertension and coronary artery disease she is on Coreg, Cardizem, Lasix, Imdur, and potassium.  5. Chronic pain. She is on Neurontin, oxycodone, and Flexeril.   6. IBS. She is on Bentyl and Protonix.   7. History of pulmonary embolism. She is on a Eliquis. Complaints of left calf pain. Medicine consult is greatly appreciated.  8. Metabolic syndrome monitoring. Cholesterol is elevated, TSH is normal. Hemoglobin A1c 5.9.     9. Fall risk. PT unable to assess until DVT ruled out. She is using a walker. .   10. Social. The patient has disability but no Medicaid. She is homeless. Her car is at the doctor's office. There is a risk that it will be towed.  11. Insomnia. Trazodone is available.   12. Disposition. TBE. She will follow up with [facility].    Orson Slick, MD 08/30/2016, 2:35 PM

## 2016-08-30 NOTE — [facility] Group Notes (Signed)
Patient did not attend therapeutic group  Sherry Blackard [place] LCSW [phone]

## 2016-08-30 NOTE — [facility] Group Notes (Signed)
[facility] Group Notes:  (Nursing/MHT/Case Management/Adjunct)  Date:  08/30/2016  Time:  4:24 AM  Type of Therapy:  Psychoeducational Skills  Participation Level:  Did Not Attend    Summary of Progress/Problems:  Reece Agar 08/30/2016, 4:24 AM

## 2016-08-30 NOTE — Plan of Care (Signed)
Problem: Coping: Goal: Ability to cope will improve Outcome: Not Progressing Patient not able to utilize coping mechanisms at this time [employer]

## 2016-08-31 MED ORDER — TRAZODONE HCL 50 MG PO TABS
150.0000 mg | ORAL_TABLET | Freq: Every day | ORAL | Status: DC
  Administered 2016-08-31 – 2016-09-03 (×4): 150 mg via ORAL
  Filled 2016-08-31 (×4): qty 1

## 2016-08-31 MED ORDER — OLANZAPINE 10 MG PO TABS
10.0000 mg | ORAL_TABLET | Freq: Every day | ORAL | Status: DC
  Administered 2016-08-31 – 2016-09-03 (×4): 10 mg via ORAL
  Filled 2016-08-31 (×4): qty 1

## 2016-08-31 NOTE — Progress Notes (Signed)
Pt continues to isolate to room except during meal times. Pt flat, depressed, sad, appears to not feel well at all. Continues to complain of cough, medication given as ordered PRN, see MAR. Pt is able to ambulate with walker, appears to have no difficulty. Reports poor sleep last night without sleep medications, stated "just told them I couldn't sleep, couldn't turn off my thoughts." Pt asleep in bed most of the day today. Did not attend group. Encouragement provided. Reports appetite is fair, energy level is low. Rates depression 5/10 (low 0-10 high). Pt does endorse thoughts of suicide that "change." Verbally consents for safety at this time. Goal today is "myself" by "wash up, wash my clothes."  Support and encouragement provided with use of therapeutic communication. Medications administered as ordered with education. Safety maintained with every 15 minute checks. Will continue to monitor.

## 2016-08-31 NOTE — [facility] Group Notes (Signed)
[place] Group Notes:  (Nursing/MHT/Case Management/Adjunct)  Date:  08/31/2016  Time:  5:43 AM  Type of Therapy:  Psychoeducational Skills  Participation Level:  Active  Participation Quality:  Appropriate  Affect:  Appropriate  Cognitive:  Appropriate  Insight:  Appropriate  Engagement in Group:  Engaged  Modes of Intervention:  Discussion, Socialization and Support  Summary of Progress/Problems:  Reece Agar 08/31/2016, 5:43 AM

## 2016-08-31 NOTE — [facility] Group Notes (Signed)
Patient did not attend therapeutic group today  Ryszard Socarras LCSW [phone]

## 2016-08-31 NOTE — Progress Notes (Addendum)
[facility] MD Progress Note  08/31/2016 1:23 PM Valerie Vazquez  MRN:  [id number]  Subjective:    12/29 Valerie Vazquez met with treatment team today. She appears severely depressed with severe psychomotor retardation. She is moving very slowly as if in pain. There is severe psychomotor retardation. She wispers her answers and is very difficult to understand. She discloses that her boyfriend, Purcell Nails, died unexpectedly 6 months ago, that she lost her housing and was forced to move in with her neighbor who no longer wants her in the apartment because she is so sick. Her plan was to live in a car where she already deposited Lawrence's ashes. She came to the hospital suicidal and continues to be so. According to the nursing note the patient has been low by and verbally aggressive. She carries diagnosis of schizoaffective disorder. She was started on Zyprexa and seems to tolerate medications well.  12/30 Valerie Vazquez does not feel well physically. She has been running low grade fever and coughing productive cough. Chest Xray shows bronchial disease. Spoke with medical consultant who recommended a course of Levaquin for 5 days as she is allergic to azithromycin. Her mood is still depressed, affect flat. She accepts medications and tolerates thm well. Cholesterol is slightly elevated. We switch to heart healthy diet. The patient has a history of pulmonary embolism and is on Eliquis. Per PT note, she complained of left calf pain for 2 days. I will ask medicine for a consult.  12/31 Valerie Vazquez feels slightly better physically today after starting antibiotic for bronchitis. She no longer has fever and has been coughing less. Chest Xray ruled out pneumonia. We were also able to rule out DVT suggested by left calf pain. She will be able to work with PT. Medicine consult is greatly appreciated. Her mood is still depressed and suicidal. She complains of insomnia.  Per Nursing: D: Patient is alert and oriented on the  unit this shift. Patient not attended and actively participated in groups today. Patient denies suicidal ideation, homicidal ideation, auditory or visual hallucinations at the present time.  A: Scheduled medications are administered to patient as per MD orders. Emotional support and encouragement are provided. Patient is maintained on q.15 minute safety checks. Patient is informed to notify staff with questions or concerns. R: No adverse medication reactions are noted. Patient is cooperative with medication administration  Patient is receptive,  anxious and cooperative on the unit at this time. Patient isolates   on the unit this shift. Patient contracts for safety at this time. Patient remains safe at this time. Depression 8/10 Anxiety 8/10  Principal Problem: Schizoaffective disorder, depressive type ([place]) Diagnosis:   Patient Active Problem List   Diagnosis Date Noted  . Schizoaffective disorder, depressive type ([place]) [F25.1] 08/28/2016  . Sinusitis, acute maxillary [J01.00] 08/27/2016  . Suicide attempt [T14.91XA] 08/27/2016  . Rash and nonspecific skin eruption [R21] 04/16/2016  . Colitis [K52.9] 04/08/2016  . Tremor [R25.1] 03/19/2016  . Left shoulder pain [M25.512] 02/18/2016  . Abdominal distension [R14.0] 02/18/2016  . Absolute anemia [D64.9] 01/29/2016  . Chronic pain [G89.29] 01/29/2016  . Neurogenic pain [M79.2] 01/29/2016  . Musculoskeletal pain [M79.1] 01/29/2016  . Long term current use of opiate analgesic [Z79.891] 01/29/2016  . Long term prescription opiate use [Z79.891] 01/29/2016  . Opiate use (15 MME/Day) [F11.90] 01/29/2016  . Encounter for therapeutic drug level monitoring [Z51.81] 01/29/2016  . Encounter for pain management planning [Z01.89] 01/29/2016  . Chronic neck pain (midline) [M54.2, G89.29] 01/29/2016  .  Chronic chest wall pain [R07.89, G89.29] 01/29/2016  . Chronic low back pain (Location of Secondary source of pain) (Bilateral) (R>L) [M54.5, G89.29]  01/29/2016  . Disturbance of skin sensation [R20.9] 01/29/2016  . Chronic shoulder pain (Location of Primary Source of Pain) (Left) [M25.512, G89.29] 01/29/2016  . Chronic hip pain (Location of Tertiary source of pain) (Bilateral) (R>L) [M25.559, G89.29] 01/29/2016  . Osteoarthritis of hip (Bilateral) [M16.0] 01/29/2016  . Lumbar facet arthropathy (Severe at L2-3, L3-4, L4-5, and L5-S1) [M12.88] 01/29/2016  . Lumbar central spinal stenosis (moderate at L4-5; mild at L2-3 and L3-4) [M48.061] 01/29/2016  . Lumbar foraminal stenosis (L4-5) (Right) [M99.83] 01/29/2016  . Lumbar facet syndrome [M12.88] 01/29/2016  . Hx of cervical spine surgery [Z98.890] 01/29/2016  . Numbness [R20.0] 11/19/2015  . Right wrist pain [M25.531] 11/09/2015  . Breast pain [N64.4] 10/19/2015  . Chest pain [R07.9] 10/14/2015  . Headache [R51] 10/14/2015  . Abdominal pain [R10.9] 10/14/2015  . Depression [F32.9] 10/14/2015  . Lip swelling [R22.0] 10/14/2015  . Pulmonary embolism (HCC) [I26.99] 08/15/2015  . Bilateral pneumonia [J18.9] 08/15/2015  . Nausea [R11.0] 08/15/2015  . Shortness of breath [R06.02] 07/31/2015  . Pleural effusion, bilateral [J90] 07/31/2015  . Cough [R05] 07/31/2015  . Chronic pain of multiple sites [R52, G89.29] 06/05/2015  . Chronic lumbosacral pain [M54.5, G89.29] 06/05/2015  . Esophageal spasm [K22.4] 06/05/2015  . History of DVT (deep vein thrombosis) [Z86.718] 02/25/2015  . Hypercementosis [K03.4] 02/13/2015  . Constipation [K59.00] 12/12/2014  . Coronary vasospasm (HCC) [I20.1] 12/09/2014  . History of pulmonary embolus (PE) [Z86.711] 09/19/2014  . Impingement syndrome of shoulder [M75.40] 08/30/2014  . Impingement syndrome of left shoulder [M75.42] 08/30/2014  . Injury of superior glenoid labrum of shoulder joint [S43.439A] 08/16/2014  . Supraspinatus tenosynovitis [M65.819] 08/16/2014  . Cervical spondylosis without myelopathy [M47.812] 08/16/2014  . Anxiety and depression  [F41.8] 04/13/2014  . Osteoarthritis of hip (Left) [M16.12] 04/11/2014  . S/P THR (total hip replacement) (Left) [U46.980] 04/11/2014  . IBS (irritable bowel syndrome) [K58.9] 04/11/2014  . GERD (gastroesophageal reflux disease) [K21.9] 04/11/2014  . Insomnia [G47.00] 04/11/2014  . Essential hypertension, benign [I10] 10/28/2013  . Left hip pain [M25.552] 10/28/2013  . Anemia [D64.9] 10/28/2013  . HLD (hyperlipidemia) [E78.5] 10/28/2013   Total Time spent with patient: 20 minutes  Past Psychiatric History: Schizoaffective disorder.  Past Medical History:  Past Medical History:  Diagnosis Date  . Anemia   . Bilateral renal cysts   . Chronic back pain    "upper and lower" (09/19/2014)  . Depression    "major" (09/19/2014)  . DVT (deep venous thrombosis) (HCC)   . Esophageal spasm   . Gallstones   . Gastritis   . Gastroenteritis   . GERD (gastroesophageal reflux disease)   . Headache    "couple /month lately" (09/19/2014)  . High blood pressure   . High cholesterol   . History of blood transfusion 1985   related to hysterectomy  . Hypertension   . IBS (irritable bowel syndrome)   . Migraine    "a few times/yr" (09/19/2014)  . Myocardial infarction 10/2010 X 3   "while hospitalized"  . Osteoarthritis    "qwhere; mostly around my joints" (09/19/2014)  . Pneumonia 04/2014  . PTSD (post-traumatic stress disorder)   . Pulmonary embolism (HCC) 04/2001; 09/19/2014   "after gallbladder OR; "  . Schizoaffective disorder (HCC)   . Sleep apnea    "mild" (09/19/2014)  . Snoring    a. sleep study 5/16: No  OSA  . Spinal stenosis   . Spondylosis   . Type II diabetes mellitus ([place])    "dx'd in 11-07-05; lost weight; no RX for ~ 4 yr now" (09/19/2014)    Past Surgical History:  Procedure Laterality Date  . ABDOMINAL HYSTERECTOMY  1985  . ANTERIOR CERVICAL DECOMP/DISCECTOMY FUSION  November 07, 2012  . APPENDECTOMY  1985  . BACK SURGERY    . BREAST CYST EXCISION Right   . BREAST EXCISIONAL BIOPSY  Right YRS AGO   NEG  . CARDIAC CATHETERIZATION   2000's; November 08, 2007  . CHOLECYSTECTOMY  Nov 07, 2000  . EXCISION/RELEASE BURSA HIP Right 12/1984  . HERNIA REPAIR  1985  . LEFT HEART CATHETERIZATION WITH CORONARY ANGIOGRAM N/A 12/09/2014   Procedure: LEFT HEART CATHETERIZATION WITH CORONARY ANGIOGRAM;  Surgeon: Burnell Blanks, MD;  Location: [facility] CATH LAB;  Service: Cardiovascular;  Laterality: N/A;  . TONSILLECTOMY AND ADENOIDECTOMY  ~ 11-08-66  . TOTAL HIP ARTHROPLASTY Left 04-06-2014  . UMBILICAL HERNIA REPAIR  1985  . VENA CAVA FILTER PLACEMENT  03/2014   Family History:  Family History  Problem Relation Age of Onset  . Stroke Mother     Deceased  . Lung cancer Father     Deceased  . Healthy Daughter   . Healthy Son     x 2  . Other Son     Suicide  . Heart attack Neg Hx    Family Psychiatric  History: See H&P. Social History:  History  Alcohol Use No     History  Drug Use No    Social History   Social History  . Marital status: Legally Separated    Spouse name: N/A  . Number of children: N/A  . Years of education: N/A   Social History Main Topics  . Smoking status: Former Smoker    Packs/day: 1.50    Years: 10.00    Types: Cigarettes    Quit date: 04/12/1979  . Smokeless tobacco: Never Used  . Alcohol use No  . Drug use: No  . Sexual activity: Not Currently   Other Topics Concern  . None   Social History Narrative   Lives with fiancee in an apartment on the first floor.  Has 3 grown children.  One child passed away.  On disability 1994-11-08 - 2000, 2007 - current.     Education: some college.   Additional Social History:                         Sleep: Poor  Appetite:  Fair  Current Medications: Current Facility-Administered Medications  Medication Dose Route Frequency Provider Last Rate Last Dose  . acetaminophen (TYLENOL) tablet 650 mg  650 mg Oral Q6H PRN Gonzella Lex, MD   650 mg at 08/29/16 1152  . albuterol (PROVENTIL HFA;VENTOLIN HFA) 108 (90  Base) MCG/ACT inhaler 2 puff  2 puff Inhalation Q4H PRN Gonzella Lex, MD   2 puff at 08/29/16 0043  . alum & mag hydroxide-simeth (MAALOX/MYLANTA) 200-200-20 MG/5ML suspension 30 mL  30 mL Oral Q4H PRN Gonzella Lex, MD      . apixaban (ELIQUIS) tablet 5 mg  5 mg Oral BID Gonzella Lex, MD   5 mg at 08/31/16 6734  . carvedilol (COREG) tablet 3.125 mg  3.125 mg Oral BID WC Gonzella Lex, MD   3.125 mg at 08/31/16 0826  . cyclobenzaprine (FLEXERIL) tablet 10 mg  10 mg Oral Q6H PRN Madie Reno  Clapacs, MD   10 mg at 08/28/16 2358  . dicyclomine (BENTYL) tablet 20 mg  20 mg Oral TID WC PRN Audery Amel, MD   20 mg at 08/31/16 9883  . diltiazem (CARDIZEM SR) 12 hr capsule 60 mg  60 mg Oral Q12H Audery Amel, MD   60 mg at 08/31/16 0829  . escitalopram (LEXAPRO) tablet 20 mg  20 mg Oral Daily Audery Amel, MD   20 mg at 08/31/16 0826  . fluticasone (FLONASE) 50 MCG/ACT nasal spray 2 spray  2 spray Each Nare Daily Audery Amel, MD   2 spray at 08/30/16 0033  . furosemide (LASIX) tablet 20 mg  20 mg Oral Daily Audery Amel, MD   20 mg at 08/31/16 0827  . gabapentin (NEURONTIN) tablet 300 mg  300 mg Oral TID Audery Amel, MD   300 mg at 08/31/16 1138  . guaiFENesin (ROBITUSSIN) 100 MG/5ML solution 300 mg  15 mL Oral Q6H PRN Audery Amel, MD   300 mg at 08/31/16 1138  . isosorbide mononitrate (IMDUR) 24 hr tablet 30 mg  30 mg Oral Daily Audery Amel, MD   30 mg at 08/31/16 0827  . levofloxacin (LEVAQUIN) tablet 250 mg  250 mg Oral Daily Shari Prows, MD   250 mg at 08/31/16 0827  . magnesium hydroxide (MILK OF MAGNESIA) suspension 30 mL  30 mL Oral Daily PRN Audery Amel, MD      . OLANZapine (ZYPREXA) tablet 10 mg  10 mg Oral QHS Christyann Manolis B Keturah Yerby, MD      . oxyCODONE-acetaminophen (PERCOCET/ROXICET) 5-325 MG per tablet 1 tablet  1 tablet Oral Q6H PRN Audery Amel, MD      . oxymetazoline (AFRIN) 0.05 % nasal spray 1 spray  1 spray Each Nare BID Audery Amel, MD   1 spray at  08/30/16 2126  . pantoprazole (PROTONIX) EC tablet 40 mg  40 mg Oral Daily Audery Amel, MD   40 mg at 08/31/16 0827  . potassium chloride SA (K-DUR,KLOR-CON) CR tablet 10 mEq  10 mEq Oral Daily Audery Amel, MD   10 mEq at 08/31/16 0827  . traZODone (DESYREL) tablet 150 mg  150 mg Oral QHS Analya Louissaint B Johndaniel Catlin, MD        Lab Results:  No results found for this or any previous visit (from the past 48 hour(s)).  Blood Alcohol level:  Lab Results  Component Value Date   ETH <5 08/27/2016    Metabolic Disorder Labs: Lab Results  Component Value Date   HGBA1C 5.9 (H) 08/29/2016   MPG 123 08/29/2016   MPG 140 (H) 09/19/2014   No results found for: PROLACTIN Lab Results  Component Value Date   CHOL 251 (H) 08/29/2016   TRIG 83 08/29/2016   HDL 38 (L) 08/29/2016   CHOLHDL 6.6 08/29/2016   VLDL 17 08/29/2016   LDLCALC 196 (H) 08/29/2016    Physical Findings: AIMS:  , ,  ,  ,    CIWA:    COWS:     Musculoskeletal: Strength & Muscle Tone: within normal limits Gait & Station: normal Patient leans: N/A  Psychiatric Specialty Exam: Physical Exam  Nursing note and vitals reviewed.   Review of Systems  Musculoskeletal: Positive for back pain.  Psychiatric/Behavioral: Positive for depression, hallucinations and suicidal ideas. The patient has insomnia.   All other systems reviewed and are negative.   Blood pressure 111/66, pulse 81,  temperature 98.4 F (36.9 C), temperature source Oral, resp. rate 18, height '5\' 6"'$  (1.676 m), weight 86.2 kg (190 lb), SpO2 96 %.Body mass index is 30.67 kg/m.  General Appearance: Casual  Eye Contact:  Poor  Speech:  Blocked  Volume:  Decreased  Mood:  Depressed, Hopeless and Worthless  Affect:  Blunt  Thought Process:  Goal Directed and Descriptions of Associations: Intact  Orientation:  Full (Time, Place, and Person)  Thought Content:  WDL  Suicidal Thoughts:  Yes.  with intent/plan  Homicidal Thoughts:  No  Memory:  Immediate;    Fair Recent;   Fair Remote;   Fair  Judgement:  Impaired  Insight:  Shallow  Psychomotor Activity:  Psychomotor Retardation  Concentration:  Concentration: Fair and Attention Span: Fair  Recall:  [employer] of Knowledge:  Fair  Language:  Fair  Akathisia:  No  Handed:  Right  AIMS (if indicated):     Assets:  Communication Skills Desire for Improvement Financial Resources/Insurance Resilience  ADL's:  Intact  Cognition:  WNL  Sleep:  Number of Hours: 4     Treatment Plan Summary: Daily contact with patient to assess and evaluate symptoms and progress in treatment and Medication management   Valerie Vazquez is a 60 year old female with a history of schizoaffective disorder admitted here for worsening of depression and suicidal ideation.  1. Suicidal ideation. The patient is able to contract for safety in the hospital.  2. Mood and psychosis. We will increase Zyprexa to 10 mg at night and continue Lexapro for depression and mood stabilization.   3. COPD. She's on albuterol. Abnormal chest Xray. We started Levaquin 250 mg daily for 5 days.  4. Hypertension and coronary artery disease she is on Coreg, Cardizem, Lasix, Imdur, and potassium.  5. Chronic pain. She is on Neurontin, oxycodone, and Flexeril.   6. IBS. She is on Bentyl and Protonix.   7. History of pulmonary embolism. She is on a Eliquis. Complaints of left calf pain. Medicine consult is greatly appreciated. There is no DVT in her left calf.   8. Metabolic syndrome monitoring. Cholesterol is elevated, TSH is normal. Hemoglobin A1c 5.9.     9. Fall risk. PT unable to assess until DVT ruled out. She is using a walker. .   10. Social. The patient has disability but no Medicaid. She is homeless. Her car is at the doctor's office. There is a risk that it will be towed.  11. Insomnia. We will increase Trazodone.    12. Disposition. TBE. She will follow up with [facility].    Orson Slick, MD 08/31/2016, 1:23 PM

## 2016-08-31 NOTE — Progress Notes (Signed)
Patient complains of not sleeping well this morning and blurry vision. Only slept 4 hours last night. Denies any SI/HI/AVH at this time. Took morning medication and ate breakfast. Remains on q15 minute checks for safety. Will continue to monitor.

## 2016-08-31 NOTE — Plan of Care (Signed)
Problem: Education: Goal: Knowledge of the prescribed therapeutic regimen will improve Patient is knowledgeable with prescribed medication and is medication compliant

## 2016-08-31 NOTE — Progress Notes (Signed)
D: Patient is alert and oriented on the unit this shift. Patient not attended and participated in groups today. Patient denies suicidal ideation, homicidal ideation, auditory or visual hallucinations at the present time.  A: Scheduled medications are administered to patient as per MD orders. Emotional support and encouragement are provided. Patient is maintained on q.15 minute safety checks. Patient is informed to notify staff with questions or concerns. R: No adverse medication reactions are noted. Patient is cooperative with medication administration Patient is receptive, calm and cooperative ,isolative due to cold/bronchitis   Patient does not interact l with others on the unit this shift. Patient contracts for safety at this time. Patient remains safe at this time. Depression 6/10 Anxiety 5/10
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
## 2016-08-31 NOTE — Progress Notes (Signed)
PT Cancellation Note  Patient Details Name: Valerie Vazquez MRN: [id number] DOB: [birth year]   Cancelled Treatment:    Reason Eval/Treat Not Completed: Other (comment) (Evaluation re-attempted.  Patient currently eating lunch and unable to participate with session. Will re-attempt at later time/date as patient available and medically appropriate.  Of note, L LE doppler negative for DVT.)   Cleotilde Spadaccini H. Owens Shark, PT, DPT, NCS 08/31/16, 12:31 PM [phone]

## 2016-08-31 NOTE — Plan of Care (Signed)
Problem: Coping: Goal: Ability to cope will improve Outcome: Not Progressing Patient not able to utilize coping mechanisms at this time [employer]

## 2016-08-31 NOTE — Progress Notes (Addendum)
[facility] [place] NAME: Valerie Vazquez    MRN#:  [id number]  DATE OF BIRTH:  [birth year]  SUBJECTIVE:  Hospital Day: 3 days Valerie Vazquez is a 60 y.o. female presenting with Cough.   Overnight events: No overnight events Interval Events: Labs/vitals reviewed, patient no medical complaints  REVIEW OF SYSTEMS:  CONSTITUTIONAL: No fever, fatigue or weakness.  EYES: No blurred or double vision.  EARS, NOSE, AND THROAT: No tinnitus or ear pain.  RESPIRATORY: Positive cough, denies shortness of breath, wheezing or hemoptysis.  CARDIOVASCULAR: No chest pain, orthopnea, edema.  GASTROINTESTINAL: No nausea, vomiting, diarrhea or abdominal pain.  GENITOURINARY: No dysuria, hematuria.  ENDOCRINE: No polyuria, nocturia,  HEMATOLOGY: No anemia, easy bruising or bleeding SKIN: No rash or lesion. MUSCULOSKELETAL: No joint pain or arthritis.   NEUROLOGIC: No tingling, numbness, weakness.  PSYCHIATRY: Positive anxiety denies depression.   DRUG ALLERGIES:   Allergies  Allergen Reactions  . Abilify [Aripiprazole] Other (See Comments)    Jerking movement, slow motor skills  . Augmentin [Amoxicillin-Pot Clavulanate] Diarrhea and Nausea And Vomiting  . Bactrim [Sulfamethoxazole-Trimethoprim] Diarrhea  . Ceftin [Cefuroxime Axetil] Diarrhea  . Coconut Oil     Rash   . Diclofenac Other (See Comments)     Muscle Pain  . Dye Fdc Red [Red Dye]     "hair dye"  . Indocin [Indomethacin] Other (See Comments)    Migraines   . Linaclotide Diarrhea  . Lisinopril Diarrhea and Other (See Comments)    Other reaction(s): Dizziness, Vomiting  . Morphine Other (See Comments)    Chest Tightness  . Morphine And Related Other (See Comments)    Chest tightness   . Simvastatin Other (See Comments)    Muscle Pain  . Sulfa Antibiotics Diarrhea  . Sulfamethoxazole-Trimethoprim Diarrhea  . Wellbutrin [Bupropion] Other (See Comments)    Jerking  movement Slurred speech  . Quetiapine Fumarate Palpitations    VITALS:  Blood pressure 111/66, pulse 81, temperature 98.4 F (36.9 C), temperature source Oral, resp. rate 18, height 5\' 6"  (1.676 m), weight 86.2 kg (190 lb), SpO2 96 %.  PHYSICAL EXAMINATION:  VITAL SIGNS: Vitals:   08/30/16 2132 08/31/16 0826  BP: 116/62 111/66  Pulse:  81  Resp:  18  Temp:  98.4 F (36.9 C)   GENERAL:60 y.o.female currently in no acute distress.  HEAD: Normocephalic, atraumatic.  EYES: Pupils equal, round, reactive to light. Extraocular muscles intact. No scleral icterus.  MOUTH: Moist mucosal membrane. Dentition intact. No abscess noted.  EAR, NOSE, THROAT: Clear without exudates. No external lesions.  NECK: Supple. No thyromegaly. No nodules. No JVD.  PULMONARY: Clear to ascultation, without wheeze rails or rhonci. No use of accessory muscles, Good respiratory effort. good air entry bilaterally CHEST: Nontender to palpation.  CARDIOVASCULAR: S1 and S2. Regular rate and rhythm. No murmurs, rubs, or gallops. No edema. Pedal pulses 2+ bilaterally.  GASTROINTESTINAL: Soft, nontender, nondistended. No masses. Positive bowel sounds. No hepatosplenomegaly.  MUSCULOSKELETAL: No swelling, clubbing, or edema. Range of motion full in all extremities.  NEUROLOGIC: Cranial nerves II through XII are intact. No gross focal neurological deficits. Sensation intact. Reflexes intact.  SKIN: No ulceration, lesions, rashes, or cyanosis. Skin warm and dry. Turgor intact.  PSYCHIATRIC: Mood, affect within normal limits. The patient is awake, alert and oriented x 3. Insight, judgment intact.      LABORATORY PANEL:   CBC  Recent Labs Lab 08/27/16 1806  WBC 7.6  HGB 12.7  HCT 38.1  PLT 408   ------------------------------------------------------------------------------------------------------------------  Chemistries   Recent Labs Lab 08/27/16 1806  NA 139  K 4.5  CL 104  CO2 27  GLUCOSE 118*   BUN 13  CREATININE 0.99  CALCIUM 10.0  AST 28  ALT 22  ALKPHOS 111  BILITOT 0.5   ------------------------------------------------------------------------------------------------------------------  Cardiac Enzymes No results for input(s): TROPONINI in the last 168 hours. ------------------------------------------------------------------------------------------------------------------  RADIOLOGY:  Dg Chest 2 View  Result Date: 08/30/2016 CLINICAL DATA:  Productive cough for 1 month. Fever. History of hypertension and pulmonary embolism. Former smoker. EXAM: CHEST  2 VIEW COMPARISON:  08/25/2016; 08/15/2016; chest CT - 08/16/2016 FINDINGS: Grossly unchanged cardiac silhouette and mediastinal contours. Interval development of bilateral infrahilar interstitial opacities with development of trace bilateral effusions. No evidence of edema. No pneumothorax. Unchanged bones. Post lower cervical ACDF, incompletely evaluated. Post cholecystectomy. Post IVC filter placement. IMPRESSION: Findings worrisome for airways disease / bronchitis, though note, atypical infection could have a similar appearance, especially in the setting of trace pleural effusions. A follow-up chest radiograph in 3 to 4 weeks after treatment is recommended to ensure resolution. Electronically Signed   By: Sandi Mariscal M.D.   On: 08/30/2016 10:52   US Venous Img Lower Unilateral Left  Result Date: 08/31/2016 CLINICAL DATA:  Left lower extremity pain. 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 EXAM: LEFT LOWER EXTREMITY VENOUS DOPPLER ULTRASOUND TECHNIQUE: Gray-scale sonography with graded compression, as well as color Doppler and duplex ultrasound were performed to evaluate the lower extremity deep venous systems from the level of the common femoral vein and including the common femoral, femoral, profunda femoral, popliteal and calf veins including the posterior tibial, peroneal and gastrocnemius veins when  visible. The superficial great saphenous vein was also interrogated. Spectral Doppler was utilized to evaluate flow at rest and with distal augmentation maneuvers in the common femoral, femoral and popliteal veins. COMPARISON:  None. FINDINGS: Contralateral Common Femoral Vein: Respiratory phasicity is normal and symmetric with the symptomatic side. No evidence of thrombus. Normal compressibility. Common Femoral Vein: No evidence of thrombus. Normal compressibility, respiratory phasicity and response to augmentation. Saphenofemoral Junction: No evidence of thrombus. Normal compressibility and flow on color Doppler imaging. Profunda Femoral Vein: No evidence of thrombus. Normal compressibility and flow on color Doppler imaging. Femoral Vein: No evidence of thrombus. Normal compressibility, respiratory phasicity and response to augmentation. Popliteal Vein: No evidence of thrombus. Normal compressibility, respiratory phasicity and response to augmentation. Calf Veins: No evidence of thrombus. Normal compressibility and flow on color Doppler imaging. Superficial Great Saphenous Vein: No evidence of thrombus. Normal compressibility and flow on color Doppler imaging. Venous Reflux:  None. Other Findings: No evidence of superficial thrombophlebitis or abnormal fluid collection. IMPRESSION: No evidence of left lower extremity deep venous thrombosis. Electronically Signed   By: Aletta Edouard M.D.   On: 08/31/2016 08:20    EKG:   Orders placed or performed during the hospital encounter of 08/28/16  . EKG 12-Lead  . EKG 12-Lead  . EKG 12-Lead  . EKG 12-Lead    ASSESSMENT AND PLAN:   Brayton Broxson is a 60 y.o. female presenting with Cough . Admitted 08/28/2016 : Day #: 3 days 1. Bronchitis: Continue with Levaquin 2/5 days 2. Leg pain: Negative for DVT-she is already on Eliquis 3. Essential hypertension medications 4. Anxiety/depression of unspecified: Continue care per psychiatry  Will signoff - please  call with other questions   All the records are reviewed and case  discussed with Care Management/Social Workerr. Management plans discussed with the patient, family and they are in agreement.  CODE STATUS: full TOTAL TIME TAKING CARE OF THIS PATIENT: 28 minutes.   POSSIBLE D/C IN  ??DAYS, DEPENDING ON CLINICAL CONDITION.   Valor Quaintance,  Karenann Cai.D on 08/31/2016 at 11:44 AM  Between 7am to 6pm - Pager - [phone]  After 6pm: House Pager: - [phone]  [facility]  Office  [phone]  CC: Primary care physician; [name], MD

## 2016-09-01 NOTE — Progress Notes (Addendum)
Patient with depressed affect, cooperative behavior with meals, meds and plan of care. No SI/HI at this time. Minimal interaction with peers. Verbalizes needs appropriately with peers. Uses prn Oxy and baclofen  for back ache with good effect. Uses prn cough med for occasional cough with good effect. Antibiotic continues. Ambulates with slow and steady gait with walker. Aware of falls protocol. Safety maintained.

## 2016-09-01 NOTE — Progress Notes (Signed)
[name] Progress Note  09/01/2016 11:25 AM Valerie Vazquez  MRN:  [id number]  Subjective:    12/29 Valerie Vazquez met with treatment team today. She appears severely depressed with severe psychomotor retardation. She is moving very slowly as if in pain. There is severe psychomotor retardation. She wispers her answers and is very difficult to understand. She discloses that her boyfriend, Purcell Nails, died unexpectedly 6 months ago, that she lost her housing and was forced to move in with her neighbor who no longer wants her in the apartment because she is so sick. Her plan was to live in a car where she already deposited Lawrence's ashes. She came to the hospital suicidal and continues to be so. According to the nursing note the patient has been low by and verbally aggressive. She carries diagnosis of schizoaffective disorder. She was started on Zyprexa and seems to tolerate medications well.  12/30 [name] does not feel well physically. She has been running low grade fever and coughing productive cough. Chest Xray shows bronchial disease. Spoke with medical consultant who recommended a course of Levaquin for 5 days as she is allergic to azithromycin. Her mood is still depressed, affect flat. She accepts medications and tolerates thm well. Cholesterol is slightly elevated. We switch to heart healthy diet. The patient has a history of pulmonary embolism and is on Eliquis. Per PT note, she complained of left calf pain for 2 days. I will ask medicine for a consult.  12/31 [name] feels slightly better physically today after starting antibiotic for bronchitis. She no longer has fever and has been coughing less. Chest Xray ruled out pneumonia. We were also able to rule out DVT suggested by left calf pain. She will be able to work with PT. Medicine consult is greatly appreciated. Her mood is still depressed and suicidal. She complains of insomnia.  09/01/2016 [name] feels physically better today. She  is well dressed and groomed, out of her room, participating in classes. Appetite is still poor. Her sleep is good. She accepts medications as prescribed and tolerating them well. She did not have fever and still has productive cough. She still feels depressed and has to "force herself" to get out of bed and participate in programming. She no longer is suicidal. She is walking about using a walker. I will order another PT consult as the patient is not ruled out for DVT.  Per Nursing: D: Patient is alert and oriented on the unit this shift. Patient not attended and actively participated in groups today. Patient denies suicidal ideation, homicidal ideation, auditory or visual hallucinations at the present time.  A: Scheduled medications are administered to patient as per MD orders. Emotional support and encouragement are provided. Patient is maintained on q.15 minute safety checks. Patient is informed to notify staff with questions or concerns. R: No adverse medication reactions are noted. Patient is cooperative with medication administration  Patient is receptive,  anxious and cooperative on the unit at this time. Patient isolates   on the unit this shift. Patient contracts for safety at this time. Patient remains safe at this time. Depression 8/10 Anxiety 8/10  Principal Problem: Schizoaffective disorder, depressive type ([place]) Diagnosis:   Patient Active Problem List   Diagnosis Date Noted  . Schizoaffective disorder, depressive type ([place]) [F25.1] 08/28/2016  . Sinusitis, acute maxillary [J01.00] 08/27/2016  . Suicide attempt [T14.91XA] 08/27/2016  . Rash and nonspecific skin eruption [R21] 04/16/2016  . Colitis [K52.9] 04/08/2016  . Tremor [R25.1] 03/19/2016  .  Left shoulder pain [M25.512] 02/18/2016  . Abdominal distension [R14.0] 02/18/2016  . Absolute anemia [D64.9] 01/29/2016  . Chronic pain [G89.29] 01/29/2016  . Neurogenic pain [M79.2] 01/29/2016  . Musculoskeletal pain [M79.1]  01/29/2016  . Long term current use of opiate analgesic [Z79.891] 01/29/2016  . Long term prescription opiate use [Z79.891] 01/29/2016  . Opiate use (15 MME/Day) [F11.90] 01/29/2016  . Encounter for therapeutic drug level monitoring [Z51.81] 01/29/2016  . Encounter for pain management planning [Z01.89] 01/29/2016  . Chronic neck pain (midline) [M54.2, G89.29] 01/29/2016  . Chronic chest wall pain [R07.89, G89.29] 01/29/2016  . Chronic low back pain (Location of Secondary source of pain) (Bilateral) (R>L) [M54.5, G89.29] 01/29/2016  . Disturbance of skin sensation [R20.9] 01/29/2016  . Chronic shoulder pain (Location of Primary Source of Pain) (Left) [M25.512, G89.29] 01/29/2016  . Chronic hip pain (Location of Tertiary source of pain) (Bilateral) (R>L) [M25.559, G89.29] 01/29/2016  . Osteoarthritis of hip (Bilateral) [M16.0] 01/29/2016  . Lumbar facet arthropathy (Severe at L2-3, L3-4, L4-5, and L5-S1) [M12.88] 01/29/2016  . Lumbar central spinal stenosis (moderate at L4-5; mild at L2-3 and L3-4) [M48.061] 01/29/2016  . Lumbar foraminal stenosis (L4-5) (Right) [M99.83] 01/29/2016  . Lumbar facet syndrome [M12.88] 01/29/2016  . Hx of cervical spine surgery [Z98.890] 01/29/2016  . Numbness [R20.0] 11/19/2015  . Right wrist pain [M25.531] 11/09/2015  . Breast pain [N64.4] 10/19/2015  . Chest pain [R07.9] 10/14/2015  . Headache [R51] 10/14/2015  . Abdominal pain [R10.9] 10/14/2015  . Depression [F32.9] 10/14/2015  . Lip swelling [R22.0] 10/14/2015  . Pulmonary embolism ([place]) [I26.99] 08/15/2015  . Bilateral pneumonia [J18.9] 08/15/2015  . Nausea [R11.0] 08/15/2015  . Shortness of breath [R06.02] 07/31/2015  . Pleural effusion, bilateral [J90] 07/31/2015  . Cough [R05] 07/31/2015  . Chronic pain of multiple sites [R52, G89.29] 06/05/2015  . Chronic lumbosacral pain [M54.5, G89.29] 06/05/2015  . Esophageal spasm [K22.4] 06/05/2015  . History of DVT (deep vein thrombosis) [Z86.718]  02/25/2015  . Hypercementosis [K03.4] 02/13/2015  . Constipation [K59.00] 12/12/2014  . Coronary vasospasm ([place]) [I20.1] 12/09/2014  . History of pulmonary embolus (PE) [Z86.711] 09/19/2014  . Impingement syndrome of shoulder [M75.40] 08/30/2014  . Impingement syndrome of left shoulder [M75.42] 08/30/2014  . Injury of superior glenoid labrum of shoulder joint [S43.439A] 08/16/2014  . Supraspinatus tenosynovitis [M65.819] 08/16/2014  . Cervical spondylosis without myelopathy [M47.812] 08/16/2014  . Anxiety and depression [F41.8] 04/13/2014  . Osteoarthritis of hip (Left) [M16.12] 04/11/2014  . S/P THR (total hip replacement) (Left) [X73.532] 04/11/2014  . IBS (irritable bowel syndrome) [K58.9] 04/11/2014  . GERD (gastroesophageal reflux disease) [K21.9] 04/11/2014  . Insomnia [G47.00] 04/11/2014  . Essential hypertension, benign [I10] 10/28/2013  . Left hip pain [M25.552] 10/28/2013  . Anemia [D64.9] 10/28/2013  . HLD (hyperlipidemia) [E78.5] 10/28/2013   Total Time spent with patient: 20 minutes  Past Psychiatric History: Schizoaffective disorder.  Past Medical History:  Past Medical History:  Diagnosis Date  . Anemia   . Bilateral renal cysts   . Chronic back pain    "upper and lower" (09/19/2014)  . Depression    "major" (09/19/2014)  . DVT (deep venous thrombosis) ([place])   . Esophageal spasm   . Gallstones   . Gastritis   . Gastroenteritis   . GERD (gastroesophageal reflux disease)   . Headache    "couple /month lately" (09/19/2014)  . High blood pressure   . High cholesterol   . History of blood transfusion 1985   related to hysterectomy  .  Hypertension   . IBS (irritable bowel syndrome)   . Migraine    "a few times/yr" (09/19/2014)  . Myocardial infarction 11/26/10 X 3   "while hospitalized"  . Osteoarthritis    "qwhere; mostly around my joints" (09/19/2014)  . Pneumonia 04/2014  . PTSD (post-traumatic stress disorder)   . Pulmonary embolism ([place]) 04/2001; 09/19/2014    "after gallbladder OR; "  . Schizoaffective disorder ([place])   . Sleep apnea    "mild" (09/19/2014)  . Snoring    a. sleep study 5/16: No OSA  . Spinal stenosis   . Spondylosis   . Type II diabetes mellitus ([place])    "dx'd in 11-25-05; lost weight; no RX for ~ 4 yr now" (09/19/2014)    Past Surgical History:  Procedure Laterality Date  . ABDOMINAL HYSTERECTOMY  1985  . ANTERIOR CERVICAL DECOMP/DISCECTOMY FUSION  November 25, 2012  . APPENDECTOMY  1985  . BACK SURGERY    . BREAST CYST EXCISION Right   . BREAST EXCISIONAL BIOPSY Right YRS AGO   NEG  . CARDIAC CATHETERIZATION   2000's; 2007-11-26  . CHOLECYSTECTOMY  2000/11/25  . EXCISION/RELEASE BURSA HIP Right 12/1984  . HERNIA REPAIR  1985  . LEFT HEART CATHETERIZATION WITH CORONARY ANGIOGRAM N/A 12/09/2014   Procedure: LEFT HEART CATHETERIZATION WITH CORONARY ANGIOGRAM;  Surgeon: Burnell Blanks, MD;  Location[facility] C[facility] LAB;  Service: Cardiovascular;  Laterality: N/A;  . TONSILLECTOMY AND ADENOIDECTOMY  ~ 11-26-1966  . TOTAL HIP ARTHROPLASTY Left 04-06-2014  . UMBILICAL HERNIA REPAIR  1985  . VENA CAVA FILTER PLACEMENT  03/2014   Family History:  Family History  Problem Relation Age of Onset  . Stroke Mother     Deceased  . Lung cancer Father     Deceased  . Healthy Daughter   . Healthy Son     x 2  . Other Son     Suicide  . Heart attack Neg Hx    Family Psychiatric  History: See H&P. Social History:  History  Alcohol Use No     History  Drug Use No    Social History   Social History  . Marital status: Legally Separated    Spouse name: N/A  . Number of children: N/A  . Years of education: N/A   Social History Main Topics  . Smoking status: Former Smoker    Packs/day: 1.50    Years: 10.00    Types: Cigarettes    Quit date: 04/12/1979  . Smokeless tobacco: Never Used  . Alcohol use No  . Drug use: No  . Sexual activity: Not Currently   Other Topics Concern  . None   Social History Narrative   Lives with fiancee in an  apartment on the first floor.  Has 3 grown children.  One child passed away.  On disability 1994/11/26 - 2000, 2007 - current.     Education: some college.   Additional Social History:                         Sleep: Poor  Appetite:  Fair  Current Medications: Current Facility-Administered Medications  Medication Dose Route Frequency Provider Last Rate Last Dose  . acetaminophen (TYLENOL) tablet 650 mg  650 mg Oral Q6H PRN Gonzella Lex, MD   650 mg at 08/29/16 1152  . albuterol (PROVENTIL HFA;VENTOLIN HFA) 108 (90 Base) MCG/ACT inhaler 2 puff  2 puff Inhalation Q4H PRN Gonzella Lex, MD   2  puff at 08/29/16 0043  . alum & mag hydroxide-simeth (MAALOX/MYLANTA) 200-200-20 MG/5ML suspension 30 mL  30 mL Oral Q4H PRN Gonzella Lex, MD      . apixaban (ELIQUIS) tablet 5 mg  5 mg Oral BID Gonzella Lex, MD   5 mg at 09/01/16 0847  . carvedilol (COREG) tablet 3.125 mg  3.125 mg Oral BID WC Gonzella Lex, MD   3.125 mg at 09/01/16 0846  . cyclobenzaprine (FLEXERIL) tablet 10 mg  10 mg Oral Q6H PRN Gonzella Lex, MD   10 mg at 08/28/16 2358  . dicyclomine (BENTYL) tablet 20 mg  20 mg Oral TID WC PRN Gonzella Lex, MD   20 mg at 08/31/16 1712  . diltiazem (CARDIZEM SR) 12 hr capsule 60 mg  60 mg Oral Q12H Gonzella Lex, MD   60 mg at 09/01/16 0849  . escitalopram (LEXAPRO) tablet 20 mg  20 mg Oral Daily Gonzella Lex, MD   20 mg at 09/01/16 0846  . fluticasone (FLONASE) 50 MCG/ACT nasal spray 2 spray  2 spray Each Nare Daily Gonzella Lex, MD   2 spray at 09/01/16 0848  . furosemide (LASIX) tablet 20 mg  20 mg Oral Daily Gonzella Lex, MD   20 mg at 09/01/16 0847  . gabapentin (NEURONTIN) tablet 300 mg  300 mg Oral TID Gonzella Lex, MD   300 mg at 09/01/16 1121  . guaiFENesin (ROBITUSSIN) 100 MG/5ML solution 300 mg  15 mL Oral Q6H PRN Gonzella Lex, MD   300 mg at 09/01/16 0852  . isosorbide mononitrate (IMDUR) 24 hr tablet 30 mg  30 mg Oral Daily Gonzella Lex, MD   30 mg at  09/01/16 0847  . levofloxacin (LEVAQUIN) tablet 250 mg  250 mg Oral Daily Clovis Fredrickson, MD   250 mg at 09/01/16 0847  . magnesium hydroxide (MILK OF MAGNESIA) suspension 30 mL  30 mL Oral Daily PRN Gonzella Lex, MD      . OLANZapine (ZYPREXA) tablet 10 mg  10 mg Oral QHS Clovis Fredrickson, MD   10 mg at 08/31/16 2126  . oxyCODONE-acetaminophen (PERCOCET/ROXICET) 5-325 MG per tablet 1 tablet  1 tablet Oral Q6H PRN Gonzella Lex, MD   1 tablet at 09/01/16 0854  . oxymetazoline (AFRIN) 0.05 % nasal spray 1 spray  1 spray Each Nare BID Gonzella Lex, MD   1 spray at 09/01/16 0848  . pantoprazole (PROTONIX) EC tablet 40 mg  40 mg Oral Daily Gonzella Lex, MD   40 mg at 09/01/16 0846  . potassium chloride SA (K-DUR,KLOR-CON) CR tablet 10 mEq  10 mEq Oral Daily Gonzella Lex, MD   10 mEq at 09/01/16 0846  . traZODone (DESYREL) tablet 150 mg  150 mg Oral QHS Clovis Fredrickson, MD   150 mg at 08/31/16 2124    Lab Results:  No results found for this or any previous visit (from the past 48 hour(s)).  Blood Alcohol level:  Lab Results  Component Value Date   ETH <5 69/67/8938    Metabolic Disorder Labs: Lab Results  Component Value Date   HGBA1C 5.9 (H) 08/29/2016   MPG 123 08/29/2016   MPG 140 (H) 09/19/2014   No results found for: PROLACTIN Lab Results  Component Value Date   CHOL 251 (H) 08/29/2016   TRIG 83 08/29/2016   HDL 38 (L) 08/29/2016   CHOLHDL 6.6 08/29/2016  VLDL 17 08/29/2016   LDLCALC 196 (H) 08/29/2016    Physical Findings: AIMS:  , ,  ,  ,    CIWA:    COWS:     Musculoskeletal: Strength & Muscle Tone: within normal limits Gait & Station: normal Patient leans: N/A  Psychiatric Specialty Exam: Physical Exam  Nursing note and vitals reviewed.   Review of Systems  Musculoskeletal: Positive for back pain.  Psychiatric/Behavioral: Positive for depression, hallucinations and suicidal ideas. The patient has insomnia.   All other systems  reviewed and are negative.   Blood pressure 111/71, pulse 78, temperature 98.5 F (36.9 C), temperature source Oral, resp. rate 18, height '5\' 6"'$  (1.676 m), weight 86.2 kg (190 lb), SpO2 96 %.Body mass index is 30.67 kg/m.  General Appearance: Casual  Eye Contact:  Poor  Speech:  Blocked  Volume:  Decreased  Mood:  Depressed, Hopeless and Worthless  Affect:  Blunt  Thought Process:  Goal Directed and Descriptions of Associations: Intact  Orientation:  Full (Time, Place, and Person)  Thought Content:  WDL  Suicidal Thoughts:  Yes.  with intent/plan  Homicidal Thoughts:  No  Memory:  Immediate;   Fair Recent;   Fair Remote;   Fair  Judgement:  Impaired  Insight:  Shallow  Psychomotor Activity:  Psychomotor Retardation  Concentration:  Concentration: Fair and Attention Span: Fair  Recall:  [employer] of Knowledge:  Fair  Language:  Fair  Akathisia:  No  Handed:  Right  AIMS (if indicated):     Assets:  Communication Skills Desire for Improvement Financial Resources/Insurance Resilience  ADL's:  Intact  Cognition:  WNL  Sleep:  Number of Hours: 4.3     Treatment Plan Summary: Daily contact with patient to assess and evaluate symptoms and progress in treatment and Medication management   [name] is a 61 year old female with a history of schizoaffective disorder admitted here for worsening of depression and suicidal ideation.  1. Suicidal ideation. The patient is able to contract for safety in the hospital.  2. Mood and psychosis. We will increase Zyprexa to 10 mg at night and continue Lexapro for depression and mood stabilization.   3. COPD. She's on albuterol. Abnormal chest Xray. We started Levaquin 250 mg daily for 5 days.  4. Hypertension and coronary artery disease she is on Coreg, Cardizem, Lasix, Imdur, and potassium.  5. Chronic pain. She is on Neurontin, oxycodone, and Flexeril.   6. IBS. She is on Bentyl and Protonix.   7. History of pulmonary embolism.  She is on a Eliquis. Complaints of left calf pain. Medicine consult is greatly appreciated. There is no DVT in her left calf.   8. Metabolic syndrome monitoring. Cholesterol is elevated, TSH is normal. Hemoglobin A1c 5.9.     9. Fall risk. The patient was ruled out for DVT. We will ask PT to assess again.   10. Social. The patient has disability but no Medicaid. She is homeless. Her car is at the doctor's office. There is a risk that it will be towed.  11. Insomnia. We will increase Trazodone.    12. Disposition. TBE. She will follow up with [facility].    Orson Slick, MD 09/01/2016, 11:25 AM

## 2016-09-01 NOTE — [facility] Group Notes (Signed)
[facility] Group Notes:  (Nursing/MHT/Case Management/Adjunct)  Date:  09/01/2016  Time:  6:34 AM  Type of Therapy:  Psychoeducational Skills  Participation Level:  Minimal  Participation Quality:  Appropriate  Affect:  Appropriate  Cognitive:  Alert  Insight:  Appropriate  Engagement in Group:  Engaged  Modes of Intervention:  Support  Summary of Progress/Problems:  Nehemiah Settle 09/01/2016, 6:34 AM

## 2016-09-01 NOTE — [facility] Group Notes (Signed)
[facility] LCSW Group Therapy   09/01/2016 9:30 am Type of Therapy: Group Therapy   Participation Level: Active   Participation Quality: Attentive, Sharing and Supportive   Affect: Depressed and Flat   Cognitive: Alert and Oriented   Insight: Developing/Improving and Engaged   Engagement in Therapy: Developing/Improving and Engaged   Modes of Intervention: Clarification, Confrontation, Discussion, Education, Exploration,  Limit-setting, Orientation, Problem-solving, Rapport Building, Art therapist, Socialization and Support   Summary of Progress/Problems: Pt identified obstacles faced currently and processed barriers involved in overcoming these obstacles. Pt identified steps necessary for overcoming these obstacles and explored motivation (internal and external) for facing these difficulties head on. Pt further identified one area of concern in their lives and chose a goal to focus on for today. Pt reported her primary goal was to assist others using the pt's own experiences with pain medication abuse and use.  Pt stated the pt's motivation was to help people by helping them avoid the pitfalls the pt had experienced.  Pt reported an obstacle to the pt's goal was being homeless. Pt was polite and cooperative with the CSW and other group members and focused and attentive to the topics discussed and the sharing of others.   Alphonse Guild. Onesha Krebbs, LCSWA, LCAS

## 2016-09-01 NOTE — Progress Notes (Signed)
Physical Therapy Treatment Patient Details Name: Valerie Vazquez MRN: [id number] DOB: [birth year] Today's Date: 09/01/2016    History of Present Illness      PT Comments    Patient screened by therapist on this date in [place]. She has been followed for SI. Patient has been using RW in the [facility] for balance concerns, RW noted to be too low for her. Once adjusted her gait normalized and she reported feeling much more comfortable with RW. Patient observed ambulating without device, noted to have antalgic pattern, which she reports is due to R hip pain (chronic). She reports RW alleviates said hip pain. No overt balance concerns on screen, she is modified independent with her transfers. At this time would recommend orthopedic consult/Outpatient PT referral for her R hip pain, RW for balance, otherwise no acute PT needs identified. PT will sign off.   Follow Up Recommendations        Equipment Recommendations       Recommendations for Other Services       Precautions / Restrictions      Mobility  Bed Mobility                  Transfers                    Ambulation/Gait                 Stairs            Wheelchair Mobility    Modified Rankin (Stroke Patients Only)       Balance                                    Cognition                            Exercises      General Comments        Pertinent Vitals/Pain      Home Living                      Prior Function            PT Goals (current goals can now be found in the care plan section)      Frequency           PT Plan      Co-evaluation             End of Session           Time:  -     Charges:                       G Codes:      Kerman Passey, PT, DPT    09/01/2016, 12:35 PM

## 2016-09-02 ENCOUNTER — Telehealth: Payer: Self-pay | Admitting: Family Medicine

## 2016-09-02 LAB — CBC
HCT: 32.3 % — ABNORMAL LOW (ref 35.0–47.0)
Hemoglobin: 11.2 g/dL — ABNORMAL LOW (ref 12.0–16.0)
MCH: 31.7 pg (ref 26.0–34.0)
MCHC: 34.8 g/dL (ref 32.0–36.0)
MCV: 90.9 fL (ref 80.0–100.0)
Platelets: 338 10*3/uL (ref 150–440)
RBC: 3.55 MIL/uL — ABNORMAL LOW (ref 3.80–5.20)
RDW: 13.7 % (ref 11.5–14.5)
WBC: 5.6 10*3/uL (ref 3.6–11.0)

## 2016-09-02 LAB — CREATININE, SERUM
Creatinine, Ser: 0.85 mg/dL (ref 0.44–1.00)
GFR calc Af Amer: >60 mL/min (ref >60)
GFR calc non Af Amer: >60 mL/min (ref >60)

## 2016-09-02 NOTE — Progress Notes (Addendum)
[place] MD Progress Note  09/02/2016 12:59 PM Valerie Vazquez  MRN:  [id number]  Subjective:    12/29 Valerie Vazquez met with treatment team today. She appears severely depressed with severe psychomotor retardation. She is moving very slowly as if in pain. There is severe psychomotor retardation. She wispers her answers and is very difficult to understand. She discloses that her boyfriend, Purcell Nails, died unexpectedly 6 months ago, that she lost her housing and was forced to move in with her neighbor who no longer wants her in the apartment because she is so sick. Her plan was to live in a car where she already deposited Lawrence's ashes. She came to the hospital suicidal and continues to be so. According to the nursing note the patient has been low by and verbally aggressive. She carries diagnosis of schizoaffective disorder. She was started on Zyprexa and seems to tolerate medications well.  12/30 Valerie Vazquez does not feel well physically. She has been running low grade fever and coughing productive cough. Chest Xray shows bronchial disease. Spoke with medical consultant who recommended a course of Levaquin for 5 days as she is allergic to azithromycin. Her mood is still depressed, affect flat. She accepts medications and tolerates thm well. Cholesterol is slightly elevated. We switch to heart healthy diet. The patient has a history of pulmonary embolism and is on Eliquis. Per PT note, she complained of left calf pain for 2 days. I will ask medicine for a consult.  12/31 Valerie Vazquez feels slightly better physically today after starting antibiotic for bronchitis. She no longer has fever and has been coughing less. Chest Xray ruled out pneumonia. We were also able to rule out DVT suggested by left calf pain. She will be able to work with PT. Medicine consult is greatly appreciated. Her mood is still depressed and suicidal. She complains of insomnia.  09/01/2016 Valerie Vazquez feels physically better today. She  is well dressed and groomed, out of her room, participating in classes. Appetite is still poor. Her sleep is good. She accepts medications as prescribed and tolerating them well. She did not have fever and still has productive cough. She still feels depressed and has to "force herself" to get out of bed and participate in programming. She no longer is suicidal. She is walking about using a walker. I will order another PT consult as the patient is not ruled out for DVT.  09/02/2016 Valerie Vazquez remains depressed and very somatic. Today, she was unable to eat lunch in the dining area due to "shoulder pain" and was allowed to eat in her room. She ate less than 50% of her tuna sandwich. She isolates herself in the room. PT evaluated her yesterday. She needs a walker and home PT. Unfortunately, she is homeless.   Per Nursing: D: Pt denies SI/HI/AVH. Pt is pleasant and cooperative, affect is flat and sad. Pt c/o cold and non productive cold, Patient stated she feels tired, VS are WNL on ABT/ Bronchitis, oxygen saturation 95% on RA, no acute distress noted, she appears less anxious, minimal interaction with peers and staff noted.  A: Pt was offered support and encouragement. Pt was given scheduled medications. Pt was encouraged to attend groups. Q 15 minute checks were done for safety.  R:Pt attends groups and interacts well with peers and staff. Pt is taking medication. Pt has no complaints.Pt receptive to treatment and safety maintained on unit.  Principal Problem: Schizoaffective disorder, depressive type ([place]) Diagnosis:   Patient Active Problem List  Diagnosis Date Noted  . Schizoaffective disorder, depressive type ([place]) [F25.1] 08/28/2016  . Sinusitis, acute maxillary [J01.00] 08/27/2016  . Suicide attempt [T14.91XA] 08/27/2016  . Rash and nonspecific skin eruption [R21] 04/16/2016  . Colitis [K52.9] 04/08/2016  . Tremor [R25.1] 03/19/2016  . Left shoulder pain [M25.512] 02/18/2016  . Abdominal  distension [R14.0] 02/18/2016  . Absolute anemia [D64.9] 01/29/2016  . Chronic pain [G89.29] 01/29/2016  . Neurogenic pain [M79.2] 01/29/2016  . Musculoskeletal pain [M79.1] 01/29/2016  . Long term current use of opiate analgesic [Z79.891] 01/29/2016  . Long term prescription opiate use [Z79.891] 01/29/2016  . Opiate use (15 MME/Day) [F11.90] 01/29/2016  . Encounter for therapeutic drug level monitoring [Z51.81] 01/29/2016  . Encounter for pain management planning [Z01.89] 01/29/2016  . Chronic neck pain (midline) [M54.2, G89.29] 01/29/2016  . Chronic chest wall pain [R07.89, G89.29] 01/29/2016  . Chronic low back pain (Location of Secondary source of pain) (Bilateral) (R>L) [M54.5, G89.29] 01/29/2016  . Disturbance of skin sensation [R20.9] 01/29/2016  . Chronic shoulder pain (Location of Primary Source of Pain) (Left) [M25.512, G89.29] 01/29/2016  . Chronic hip pain (Location of Tertiary source of pain) (Bilateral) (R>L) [M25.559, G89.29] 01/29/2016  . Osteoarthritis of hip (Bilateral) [M16.0] 01/29/2016  . Lumbar facet arthropathy (Severe at L2-3, L3-4, L4-5, and L5-S1) [M12.88] 01/29/2016  . Lumbar central spinal stenosis (moderate at L4-5; mild at L2-3 and L3-4) [M48.061] 01/29/2016  . Lumbar foraminal stenosis (L4-5) (Right) [M99.83] 01/29/2016  . Lumbar facet syndrome [M12.88] 01/29/2016  . Hx of cervical spine surgery [Z98.890] 01/29/2016  . Numbness [R20.0] 11/19/2015  . Right wrist pain [M25.531] 11/09/2015  . Breast pain [N64.4] 10/19/2015  . Chest pain [R07.9] 10/14/2015  . Headache [R51] 10/14/2015  . Abdominal pain [R10.9] 10/14/2015  . Depression [F32.9] 10/14/2015  . Lip swelling [R22.0] 10/14/2015  . Pulmonary embolism ([place]) [I26.99] 08/15/2015  . Bilateral pneumonia [J18.9] 08/15/2015  . Nausea [R11.0] 08/15/2015  . Shortness of breath [R06.02] 07/31/2015  . Pleural effusion, bilateral [J90] 07/31/2015  . Cough [R05] 07/31/2015  . Chronic pain of multiple sites  [R52, G89.29] 06/05/2015  . Chronic lumbosacral pain [M54.5, G89.29] 06/05/2015  . Esophageal spasm [K22.4] 06/05/2015  . History of DVT (deep vein thrombosis) [Z86.718] 02/25/2015  . Hypercementosis [K03.4] 02/13/2015  . Constipation [K59.00] 12/12/2014  . Coronary vasospasm ([place]) [I20.1] 12/09/2014  . History of pulmonary embolus (PE) [Z86.711] 09/19/2014  . Impingement syndrome of shoulder [M75.40] 08/30/2014  . Impingement syndrome of left shoulder [M75.42] 08/30/2014  . Injury of superior glenoid labrum of shoulder joint [S43.439A] 08/16/2014  . Supraspinatus tenosynovitis [M65.819] 08/16/2014  . Cervical spondylosis without myelopathy [M47.812] 08/16/2014  . Anxiety and depression [F41.8] 04/13/2014  . Osteoarthritis of hip (Left) [M16.12] 04/11/2014  . S/P THR (total hip replacement) (Left) [Y19.509] 04/11/2014  . IBS (irritable bowel syndrome) [K58.9] 04/11/2014  . GERD (gastroesophageal reflux disease) [K21.9] 04/11/2014  . Insomnia [G47.00] 04/11/2014  . Essential hypertension, benign [I10] 10/28/2013  . Left hip pain [M25.552] 10/28/2013  . Anemia [D64.9] 10/28/2013  . HLD (hyperlipidemia) [E78.5] 10/28/2013   Total Time spent with patient: 20 minutes  Past Psychiatric History: Schizoaffective disorder.  Past Medical History:  Past Medical History:  Diagnosis Date  . Anemia   . Bilateral renal cysts   . Chronic back pain    "upper and lower" (09/19/2014)  . Depression    "major" (09/19/2014)  . DVT (deep venous thrombosis) ([place])   . Esophageal spasm   . Gallstones   . Gastritis   .  Gastroenteritis   . GERD (gastroesophageal reflux disease)   . Headache    "couple /month lately" (09/19/2014)  . High blood pressure   . High cholesterol   . History of blood transfusion 1985   related to hysterectomy  . Hypertension   . IBS (irritable bowel syndrome)   . Migraine    "a few times/yr" (09/19/2014)  . Myocardial infarction 10/2010 X 3   "while hospitalized"  .  Osteoarthritis    "qwhere; mostly around my joints" (09/19/2014)  . Pneumonia 04/2014  . PTSD (post-traumatic stress disorder)   . Pulmonary embolism ([place]) 04/2001; 09/19/2014   "after gallbladder OR; "  . Schizoaffective disorder ([place])   . Sleep apnea    "mild" (09/19/2014)  . Snoring    a. sleep study 5/16: No OSA  . Spinal stenosis   . Spondylosis   . Type II diabetes mellitus ([place])    "dx'd in Nov 13, 2005; lost weight; no RX for ~ 4 yr now" (09/19/2014)    Past Surgical History:  Procedure Laterality Date  . ABDOMINAL HYSTERECTOMY  1985  . ANTERIOR CERVICAL DECOMP/DISCECTOMY FUSION  10/2012  . APPENDECTOMY  1985  . BACK SURGERY    . BREAST CYST EXCISION Right   . BREAST EXCISIONAL BIOPSY Right YRS AGO   NEG  . CARDIAC CATHETERIZATION   2000's; 2007-11-14  . CHOLECYSTECTOMY  2000/11/13  . EXCISION/RELEASE BURSA HIP Right 12/1984  . HERNIA REPAIR  1985  . LEFT HEART CATHETERIZATION WITH CORONARY ANGIOGRAM N/A 12/09/2014   Procedure: LEFT HEART CATHETERIZATION WITH CORONARY ANGIOGRAM;  Surgeon: Burnell Blanks, MD;  [facility] C[facility] LAB;  Service: Cardiovascular;  Laterality: N/A;  . TONSILLECTOMY AND ADENOIDECTOMY  ~ 11-13-1966  . TOTAL HIP ARTHROPLASTY Left 04-06-2014  . UMBILICAL HERNIA REPAIR  1985  . VENA CAVA FILTER PLACEMENT  03/2014   Family History:  Family History  Problem Relation Age of Onset  . Stroke Mother     Deceased  . Lung cancer Father     Deceased  . Healthy Daughter   . Healthy Son     x 2  . Other Son     Suicide  . Heart attack Neg Hx    Family Psychiatric  History: See H&P. Social History:  History  Alcohol Use No     History  Drug Use No    Social History   Social History  . Marital status: Legally Separated    Spouse name: N/A  . Number of children: N/A  . Years of education: N/A   Social History Main Topics  . Smoking status: Former Smoker    Packs/day: 1.50    Years: 10.00    Types: Cigarettes    Quit date: 04/12/1979  . Smokeless tobacco:  Never Used  . Alcohol use No  . Drug use: No  . Sexual activity: Not Currently   Other Topics Concern  . None   Social History Narrative   Lives with fiancee in an apartment on the first floor.  Has 3 grown children.  One child passed away.  On disability 1994/11/13 - 2000, 2007 - current.     Education: some college.   Additional Social History:                         Sleep: Poor  Appetite:  Fair  Current Medications: Current Facility-Administered Medications  Medication Dose Route Frequency Provider Last Rate Last Dose  . acetaminophen (TYLENOL) tablet 650  mg  650 mg Oral Q6H PRN Gonzella Lex, MD   650 mg at 08/29/16 1152  . albuterol (PROVENTIL HFA;VENTOLIN HFA) 108 (90 Base) MCG/ACT inhaler 2 puff  2 puff Inhalation Q4H PRN Gonzella Lex, MD   2 puff at 08/29/16 0043  . alum & mag hydroxide-simeth (MAALOX/MYLANTA) 200-200-20 MG/5ML suspension 30 mL  30 mL Oral Q4H PRN Gonzella Lex, MD      . apixaban (ELIQUIS) tablet 5 mg  5 mg Oral BID Gonzella Lex, MD   5 mg at 09/02/16 0850  . carvedilol (COREG) tablet 3.125 mg  3.125 mg Oral BID WC Gonzella Lex, MD   3.125 mg at 09/01/16 1600  . cyclobenzaprine (FLEXERIL) tablet 10 mg  10 mg Oral Q6H PRN Gonzella Lex, MD   10 mg at 08/28/16 2358  . dicyclomine (BENTYL) tablet 20 mg  20 mg Oral TID WC PRN Gonzella Lex, MD   20 mg at 08/31/16 1712  . diltiazem (CARDIZEM SR) 12 hr capsule 60 mg  60 mg Oral Q12H Gonzella Lex, MD   60 mg at 09/01/16 2146  . escitalopram (LEXAPRO) tablet 20 mg  20 mg Oral Daily Gonzella Lex, MD   20 mg at 09/02/16 0850  . fluticasone (FLONASE) 50 MCG/ACT nasal spray 2 spray  2 spray Each Nare Daily Gonzella Lex, MD   2 spray at 09/02/16 0851  . furosemide (LASIX) tablet 20 mg  20 mg Oral Daily Gonzella Lex, MD   20 mg at 09/02/16 0850  . gabapentin (NEURONTIN) tablet 300 mg  300 mg Oral TID Gonzella Lex, MD   300 mg at 09/02/16 1150  . guaiFENesin (ROBITUSSIN) 100 MG/5ML solution 300 mg   15 mL Oral Q6H PRN Gonzella Lex, MD   300 mg at 09/01/16 2151  . isosorbide mononitrate (IMDUR) 24 hr tablet 30 mg  30 mg Oral Daily Gonzella Lex, MD   30 mg at 09/02/16 0850  . levofloxacin (LEVAQUIN) tablet 250 mg  250 mg Oral Daily Clovis Fredrickson, MD   250 mg at 09/02/16 0849  . magnesium hydroxide (MILK OF MAGNESIA) suspension 30 mL  30 mL Oral Daily PRN Gonzella Lex, MD      . OLANZapine (ZYPREXA) tablet 10 mg  10 mg Oral QHS Clovis Fredrickson, MD   10 mg at 09/01/16 2147  . oxyCODONE-acetaminophen (PERCOCET/ROXICET) 5-325 MG per tablet 1 tablet  1 tablet Oral Q6H PRN Gonzella Lex, MD   1 tablet at 09/01/16 0854  . oxymetazoline (AFRIN) 0.05 % nasal spray 1 spray  1 spray Each Nare BID Gonzella Lex, MD   1 spray at 09/02/16 [phone]  . pantoprazole (PROTONIX) EC tablet 40 mg  40 mg Oral Daily Gonzella Lex, MD   40 mg at 09/02/16 0850  . potassium chloride SA (K-DUR,KLOR-CON) CR tablet 10 mEq  10 mEq Oral Daily Gonzella Lex, MD   10 mEq at 09/02/16 0850  . traZODone (DESYREL) tablet 150 mg  150 mg Oral QHS Clovis Fredrickson, MD   150 mg at 09/01/16 2147    Lab Results:  Results for orders placed or performed during the hospital encounter of 08/28/16 (from the past 48 hour(s))  CBC     Status: Abnormal   Collection Time: 09/02/16  6:47 AM  Result Value Ref Range   WBC 5.6 3.6 - 11.0 K/uL   RBC 3.55 (L) 3.80 -  5.20 MIL/uL   Hemoglobin 11.2 (L) 12.0 - 16.0 g/dL   HCT 32.3 (L) 35.0 - 47.0 %   MCV 90.9 80.0 - 100.0 fL   MCH 31.7 26.0 - 34.0 pg   MCHC 34.8 32.0 - 36.0 g/dL   RDW 13.7 11.5 - 14.5 %   Platelets 338 150 - 440 K/uL  Creatinine, serum     Status: None   Collection Time: 09/02/16  6:47 AM  Result Value Ref Range   Creatinine, Ser 0.85 0.44 - 1.00 mg/dL   GFR calc non Af Amer >60 >60 mL/min   GFR calc Af Amer >60 >60 mL/min    Comment: (NOTE) The eGFR has been calculated using the CKD EPI equation. This calculation has not been validated in all  clinical situations. eGFR's persistently <60 mL/min signify possible Chronic Kidney Disease.     Blood Alcohol level:  Lab Results  Component Value Date   ETH <5 44/96/7591    Metabolic Disorder Labs: Lab Results  Component Value Date   HGBA1C 5.9 (H) 08/29/2016   MPG 123 08/29/2016   MPG 140 (H) 09/19/2014   No results found for: PROLACTIN Lab Results  Component Value Date   CHOL 251 (H) 08/29/2016   TRIG 83 08/29/2016   HDL 38 (L) 08/29/2016   CHOLHDL 6.6 08/29/2016   VLDL 17 08/29/2016   LDLCALC 196 (H) 08/29/2016    Physical Findings: AIMS:  , ,  ,  ,    CIWA:    COWS:     Musculoskeletal: Strength & Muscle Tone: within normal limits Gait & Station: normal Patient leans: N/A  Psychiatric Specialty Exam: Physical Exam  Nursing note and vitals reviewed.   Review of Systems  Musculoskeletal: Positive for back pain.  Psychiatric/Behavioral: Positive for depression, hallucinations and suicidal ideas. The patient has insomnia.   All other systems reviewed and are negative.   Blood pressure (!) 105/52, pulse 75, temperature 98.6 F (37 C), temperature source Oral, resp. rate 18, height _0  (1.676 m), weight 86.2 kg (190 lb), SpO2 96 %.Body mass index is 30.67 kg/m.  General Appearance: Casual  Eye Contact:  Poor  Speech:  Blocked  Volume:  Decreased  Mood:  Depressed, Hopeless and Worthless  Affect:  Blunt  Thought Process:  Goal Directed and Descriptions of Associations: Intact  Orientation:  Full (Time, Place, and Person)  Thought Content:  WDL  Suicidal Thoughts:  Yes.  with intent/plan  Homicidal Thoughts:  No  Memory:  Immediate;   Fair Recent;   Fair Remote;   Fair  Judgement:  Impaired  Insight:  Shallow  Psychomotor Activity:  Psychomotor Retardation  Concentration:  Concentration: Fair and Attention Span: Fair  Recall:  [employer] of Knowledge:  Fair  Language:  Fair  Akathisia:  No  Handed:  Right  AIMS (if indicated):     Assets:   Communication Skills Desire for Improvement Financial Resources/Insurance Resilience  ADL's:  Intact  Cognition:  WNL  Sleep:  Number of Hours: 6     Treatment Plan Summary: Daily contact with patient to assess and evaluate symptoms and progress in treatment and Medication management   Valerie Vazquez is a 61 year old female with a history of schizoaffective disorder admitted here for worsening of depression and suicidal ideation.  1. Suicidal ideation. The patient is able to contract for safety in the hospital.  2. Mood and psychosis. We will increase Zyprexa to 10 mg at night and continue Lexapro for depression  and mood stabilization.   3. COPD. She's on albuterol. Abnormal chest Xray. We started Levaquin 250 mg daily for 5 days.  4. Hypertension and coronary artery disease she is on Coreg, Cardizem, Lasix, Imdur, and potassium.  5. Chronic pain. She is on Neurontin, oxycodone, and Flexeril.   6. IBS. She is on Bentyl and Protonix.   7. History of pulmonary embolism. She is on a Eliquis. Complaints of left calf pain. Medicine consult is greatly appreciated. There is no DVT in her left calf.   8. Metabolic syndrome monitoring. Cholesterol is elevated, TSH is normal. Hemoglobin A1c 5.9.     9. Fall risk. The patient was ruled out for DVT. PT input is greatly appreciated. Will order a walker.    10. Social. The patient has disability but no Medicaid. She is homeless. Her car is at the doctor's office. There is a risk that it will be towed.  11. Insomnia. We will increase Trazodone.    12. Disposition. TBE. She will follow up with [facility].    Orson Slick, MD 09/02/2016, 12:59 PM

## 2016-09-02 NOTE — Tx Team (Signed)
Interdisciplinary Treatment and Diagnostic Plan Update  09/02/2016 Time of Session: 10:30 AM Valerie Vazquez MRN: [id number]  Principal Diagnosis: Schizoaffective disorder, depressive type ([place])  Secondary Diagnoses: Principal Problem:   Schizoaffective disorder, depressive type ([place]) Active Problems:   Essential hypertension, benign   IBS (irritable bowel syndrome)   History of DVT (deep vein thrombosis)   Chronic pain   Musculoskeletal pain   Current Medications:  Current Facility-Administered Medications  Medication Dose Route Frequency Provider Last Rate Last Dose  . acetaminophen (TYLENOL) tablet 650 mg  650 mg Oral Q6H PRN Gonzella Lex, MD   650 mg at 08/29/16 1152  . albuterol (PROVENTIL HFA;VENTOLIN HFA) 108 (90 Base) MCG/ACT inhaler 2 puff  2 puff Inhalation Q4H PRN Gonzella Lex, MD   2 puff at 08/29/16 0043  . alum & mag hydroxide-simeth (MAALOX/MYLANTA) 200-200-20 MG/5ML suspension 30 mL  30 mL Oral Q4H PRN Gonzella Lex, MD      . apixaban (ELIQUIS) tablet 5 mg  5 mg Oral BID Gonzella Lex, MD   5 mg at 09/02/16 0850  . carvedilol (COREG) tablet 3.125 mg  3.125 mg Oral BID WC Gonzella Lex, MD   3.125 mg at 09/01/16 1600  . cyclobenzaprine (FLEXERIL) tablet 10 mg  10 mg Oral Q6H PRN Gonzella Lex, MD   10 mg at 08/28/16 2358  . dicyclomine (BENTYL) tablet 20 mg  20 mg Oral TID WC PRN Gonzella Lex, MD   20 mg at 08/31/16 1712  . diltiazem (CARDIZEM SR) 12 hr capsule 60 mg  60 mg Oral Q12H Gonzella Lex, MD   60 mg at 09/01/16 2146  . escitalopram (LEXAPRO) tablet 20 mg  20 mg Oral Daily Gonzella Lex, MD   20 mg at 09/02/16 0850  . fluticasone (FLONASE) 50 MCG/ACT nasal spray 2 spray  2 spray Each Nare Daily Gonzella Lex, MD   2 spray at 09/02/16 0851  . furosemide (LASIX) tablet 20 mg  20 mg Oral Daily Gonzella Lex, MD   20 mg at 09/02/16 0850  . gabapentin (NEURONTIN) tablet 300 mg  300 mg Oral TID Gonzella Lex, MD   300 mg at 09/02/16 0850  .  guaiFENesin (ROBITUSSIN) 100 MG/5ML solution 300 mg  15 mL Oral Q6H PRN Gonzella Lex, MD   300 mg at 09/01/16 2151  . isosorbide mononitrate (IMDUR) 24 hr tablet 30 mg  30 mg Oral Daily Gonzella Lex, MD   30 mg at 09/02/16 0850  . levofloxacin (LEVAQUIN) tablet 250 mg  250 mg Oral Daily Clovis Fredrickson, MD   250 mg at 09/02/16 0849  . magnesium hydroxide (MILK OF MAGNESIA) suspension 30 mL  30 mL Oral Daily PRN Gonzella Lex, MD      . OLANZapine (ZYPREXA) tablet 10 mg  10 mg Oral QHS Clovis Fredrickson, MD   10 mg at 09/01/16 2147  . oxyCODONE-acetaminophen (PERCOCET/ROXICET) 5-325 MG per tablet 1 tablet  1 tablet Oral Q6H PRN Gonzella Lex, MD   1 tablet at 09/01/16 0854  . oxymetazoline (AFRIN) 0.05 % nasal spray 1 spray  1 spray Each Nare BID Gonzella Lex, MD   1 spray at 09/02/16 [phone]  . pantoprazole (PROTONIX) EC tablet 40 mg  40 mg Oral Daily Gonzella Lex, MD   40 mg at 09/02/16 0850  . potassium chloride SA (K-DUR,KLOR-CON) CR tablet 10 mEq  10 mEq Oral Daily Jenny Reichmann  T Clapacs, MD   10 mEq at 09/02/16 0850  . traZODone (DESYREL) tablet 150 mg  150 mg Oral QHS Clovis Fredrickson, MD   150 mg at 09/01/16 2147   PTA Medications: Prescriptions Prior to Admission  Medication Sig Dispense Refill Last Dose  . albuterol (PROVENTIL HFA;VENTOLIN HFA) 108 (90 Base) MCG/ACT inhaler Inhale 2 puffs into the lungs every 6 (six) hours as needed for wheezing or shortness of breath. 1 Inhaler 2 prn at prn  . apixaban (ELIQUIS) 5 MG TABS tablet Take 1 tablet (5 mg total) by mouth 2 (two) times daily. 30 tablet 0 unknown at unknown  . benzonatate (TESSALON) 100 MG capsule Take 100 mg by mouth 2 (two) times daily as needed for cough.   unknown at unknown  . calcium carbonate (TUMS - DOSED IN MG ELEMENTAL CALCIUM) 500 MG chewable tablet Chew 1 tablet by mouth daily as needed for indigestion or heartburn.   prn at prn  . carvedilol (COREG) 3.125 MG tablet Take 1 tablet (3.125 mg total) by mouth 2  (two) times daily with a meal. 30 tablet 0 unknown at unknown  . dicyclomine (BENTYL) 10 MG capsule TAKE 2 CAPSULES BY MOUTH EVERY SIX HOURS (Patient taking differently: TAKE 1 CAPSULES BY MOUTH THREE TIMES DAILY AS NEEDED FOR SPASMS.) 240 capsule 5 unknown at unknown  . dicyclomine (BENTYL) 20 MG tablet Take 1 tablet (20 mg total) by mouth 3 (three) times daily as needed for spasms. (Patient not taking: Reported on 08/28/2016) 30 tablet 0 Not Taking at Unknown time  . diltiazem (CARDIZEM SR) 60 MG 12 hr capsule TAKE 1 CAPSULE (60 MG TOTAL) BY MOUTH EVERY 6 (SIX) HOURS AS NEEDED. 120 capsule 3 unknown at unknown  . diphenhydrAMINE (BENADRYL) 25 MG tablet Take 1-2 tablets (25-50 mg total) by mouth every 6 (six) hours as needed for itching ([place]). 30 tablet 0 prn at prn  . EPINEPHrine 0.3 mg/0.3 mL IJ SOAJ injection Inject 0.3 mLs (0.3 mg total) into the muscle once. 1 Device 0 prn at prn  . escitalopram (LEXAPRO) 10 MG tablet Take 1 tablet (10 mg total) by mouth daily. 30 tablet 0 unknown at unknown  . furosemide (LASIX) 20 MG tablet TAKE 1 TABLET BY MOUTH EVERY DAY (Patient taking differently: TAKE 1 TABLET BY MOUTH EVERY DAY prn) 90 tablet 2 prn at prn  . gabapentin (NEURONTIN) 600 MG tablet Take 0.5 tablets (300 mg total) by mouth every 8 (eight) hours. 30 tablet 0 unknown at unknown  . isosorbide mononitrate (IMDUR) 30 MG 24 hr tablet Take 1 tablet (30 mg total) by mouth daily. 30 tablet 0 unknown at unknown  . Multiple Vitamin (MULTIVITAMIN WITH MINERALS) TABS tablet Take 2 tablets by mouth daily.    unknown at unknown  . mupirocin ointment (BACTROBAN) 2 % Place 1 application into the nose 3 (three) times daily. 22 g 0 prn at prn  . ondansetron (ZOFRAN) 4 MG tablet Take 1 tablet (4 mg total) by mouth every 8 (eight) hours as needed for nausea or vomiting. 30 tablet 0 prn at prn  . oxyCODONE-acetaminophen (PERCOCET/ROXICET) 5-325 MG tablet Take 1 tablet by mouth 2 (two) times daily as needed for  severe pain. (Patient not taking: Reported on 08/28/2016) 30 tablet 0 Completed Course at Unknown time  . pantoprazole (PROTONIX) 40 MG tablet TAKE ONE TABLET BY MOUTH EVERY DAY 30 tablet 5 unknown at unknown  . potassium chloride (K-DUR) 10 MEQ tablet Take 1 tablet (10 mEq  total) by mouth 2 (two) times daily. (Patient taking differently: Take 10 mEq by mouth 2 (two) times daily as needed. ) 60 tablet 11 prn at prn  . traZODone (DESYREL) 50 MG tablet Take 1 tablet (50 mg total) by mouth at bedtime. (Patient taking differently: Take 50 mg by mouth at bedtime as needed for sleep. ) 30 tablet 5 prn at prn  . triamcinolone (NASACORT AQ) 55 MCG/ACT AERO nasal inhaler Place 2 sprays into the nose daily. (Patient not taking: Reported on 08/28/2016) 1 Inhaler 12 Not Taking at Unknown time    Patient Stressors: Health problems Medication change or noncompliance  Patient Strengths: Average or above average intelligence Capable of independent living Communication skills  Treatment Modalities: Medication Management, Group therapy, Case management,  1 to 1 session with clinician, Psychoeducation, Recreational therapy.   Physician Treatment Plan for Primary Diagnosis: Schizoaffective disorder, depressive type ([place]) Long Term Goal(s): Improvement in symptoms so as ready for discharge Improvement in symptoms so as ready for discharge   Short Term Goals: Ability to verbalize feelings will improve Ability to disclose and discuss suicidal ideas Ability to identify and develop effective coping behaviors will improve Ability to maintain clinical measurements within normal limits will improve  Medication Management: Evaluate patient's response, side effects, and tolerance of medication regimen.  Therapeutic Interventions: 1 to 1 sessions, Unit Group sessions and Medication administration.  Evaluation of Outcomes: Progressing  Physician Treatment Plan for Secondary Diagnosis: Principal Problem:    Schizoaffective disorder, depressive type ([place]) Active Problems:   Essential hypertension, benign   IBS (irritable bowel syndrome)   History of DVT (deep vein thrombosis)   Chronic pain   Musculoskeletal pain  Long Term Goal(s): Improvement in symptoms so as ready for discharge Improvement in symptoms so as ready for discharge   Short Term Goals: Ability to verbalize feelings will improve Ability to disclose and discuss suicidal ideas Ability to identify and develop effective coping behaviors will improve Ability to maintain clinical measurements within normal limits will improve     Medication Management: Evaluate patient's response, side effects, and tolerance of medication regimen.  Therapeutic Interventions: 1 to 1 sessions, Unit Group sessions and Medication administration.  Evaluation of Outcomes: Progressing   RN Treatment Plan for Primary Diagnosis: Schizoaffective disorder, depressive type ([place]) Long Term Goal(s): Knowledge of disease and therapeutic regimen to maintain health will improve  Short Term Goals: Ability to remain free from injury will improve, Ability to demonstrate self-control, Ability to disclose and discuss suicidal ideas, Ability to identify and develop effective coping behaviors will improve and Compliance with prescribed medications will improve  Medication Management: RN will administer medications as ordered by provider, will assess and evaluate patient's response and provide education to patient for prescribed medication. RN will report any adverse and/or side effects to prescribing provider.  Therapeutic Interventions: 1 on 1 counseling sessions, Psychoeducation, Medication administration, Evaluate responses to treatment, Monitor vital signs and CBGs as ordered, Perform/monitor CIWA, COWS, AIMS and Fall Risk screenings as ordered, Perform wound care treatments as ordered.  Evaluation of Outcomes: Progressing   LCSW Treatment Plan for Primary  Diagnosis: Schizoaffective disorder, depressive type ([place]) Long Term Goal(s): Safe transition to appropriate next level of care at discharge, Engage patient in therapeutic group addressing interpersonal concerns.  Short Term Goals: Engage patient in aftercare planning with referrals and resources, Increase social support, Increase ability to appropriately verbalize feelings, Facilitate patient progression through stages of change regarding substance use diagnoses and concerns and Increase skills for  wellness and recovery  Therapeutic Interventions: Assess for all discharge needs, 1 to 1 time with Social worker, Explore available resources and support systems, Assess for adequacy in community support network, Educate family and significant other(s) on suicide prevention, Complete Psychosocial Assessment, Interpersonal group therapy.  Evaluation of Outcomes: Progressing   Progress in Treatment: Attending groups: No. Participating in groups: No. Taking medication as prescribed: Yes. Toleration medication: Yes. Family/Significant other contact made: No, will contact:  CSW still assessing proper contacts. Patient understands diagnosis: Yes. Discussing patient identified problems/goals with staff: Yes. Medical problems stabilized or resolved: Yes. Denies suicidal/homicidal ideation: No. Issues/concerns per patient self-inventory: No.  New problem(s) identified: No, Describe:  No identified.  New Short Term/Long Term Goal(s):  Discharge Plan or Barriers:   Reason for Continuation of Hospitalization: Depression Suicidal ideation Other; describe Chronic pain  Estimated Length of Stay: 5-7 days   Attendees: Patient:  09/02/2016 11:28 AM  Physician: Dr. Orson Slick, MD  09/02/2016 11:28 AM  Nursing: Polly Cobia, RN 09/02/2016 11:28 AM  RN Care Manager: 09/02/2016 11:28 AM  Social Worker: Alphonse Guild. Daine Gravel, LCAS   09/02/2016 11:28 AM   Scribe for Treatment Team: Claudine Mouton, LCSWA 09/02/2016 11:28 AM

## 2016-09-02 NOTE — Plan of Care (Signed)
Problem: Activity: Goal: Interest or engagement in leisure activities will improve Outcome: Progressing Patient did attend group

## 2016-09-02 NOTE — Progress Notes (Signed)
D: Pt denies SI/HI/AVH. Pt is pleasant and cooperative, affect is flat and sad. Pt c/o cold and non productive cold, Patient stated she feels tired, VS are WNL on ABT/ Bronchitis, oxygen saturation 95% on RA, no acute distress noted, she appears less anxious, minimal interaction with peers and staff noted.  A: Pt was offered support and encouragement. Pt was given scheduled medications. Pt was encouraged to attend groups. Q 15 minute checks were done for safety.  R:Pt attends groups and interacts well with peers and staff. Pt is taking medication. Pt has no complaints.Pt receptive to treatment and safety maintained on unit.

## 2016-09-02 NOTE — [facility] Group Notes (Signed)
[place] LCSW Group Therapy   09/02/2016  9:30 AM  Type of Therapy: Group Therapy   Participation Level: Did Not Attend. Patient invited to participate but declined.    Alphonse Guild. Breeze Berringer, MSW, LCSWA, LCAS

## 2016-09-02 NOTE — Progress Notes (Signed)
Recreation Therapy Notes  Date: 01.02.18 Time: 3:00 pm Location: Craft Room  Group Topic: Self-expression  Goal Area(s) Addresses:  Patient will identify one color per emotion listed on wheel. Patient will verbalize benefit of using art as a means of self-expression. Patient will verbalize one emotion experienced during session. Patient will be educated on other forms of self-expression.  Behavioral Response: Attentive, Interactive  Intervention: Emotion Wheel  Activity: Patients were given an Licensed conveyancer with 7 different emotions listed. Patients were instructed to pick a color for each emotion and color it in on the wheel.  Education: LRT educated patients on other forms of self-expression.  Education Outcome: Acknowledges education/In group clarification offered  Clinical Observations/Feedback: Patient picked colors for each emotion. Patient contributed to group discussion by stating what colors she picked for certain emotions and why, and what emotion she felt during group.  Leonette Monarch, LRT/CTRS 09/02/2016 4:11 PM

## 2016-09-02 NOTE — Telephone Encounter (Signed)
Valerie Vazquez a Education officer, museum from [facility] called to check to make sure that pt's car has not been towed. Asked that if it does get towed to please call him and he will inform the pt. His number is [phone]

## 2016-09-02 NOTE — [facility] Group Notes (Signed)
[facility] Group Notes:  (Nursing/MHT/Case Management/Adjunct)  Date:  09/02/2016  Time:  6:05 AM  Type of Therapy:  Psychoeducational Skills  Participation Level:  Active  Participation Quality:  Appropriate  Affect:  Appropriate  Cognitive:  Appropriate  Insight:  Appropriate  Engagement in Group:  Engaged  Modes of Intervention:  Discussion, Socialization and Support  Summary of Progress/Problems:  Reece Agar 09/02/2016, 6:05 AM

## 2016-09-02 NOTE — [facility] Group Notes (Signed)
Goals Group  Date/Time: 09/02/2016 9am  Type of Therapy and Topic: Group Therapy: Goals Group: SMART Goals   Pt was called, but did not attend   Alphonse Guild. Emerly Prak, LCSWA, LCAS

## 2016-09-03 MED ORDER — ISOSORBIDE MONONITRATE ER 30 MG PO TB24: 30.0000 mg | ORAL_TABLET | Freq: Every day | ORAL | 1 refills | Status: DC

## 2016-09-03 MED ORDER — GABAPENTIN 600 MG PO TABS: 300.0000 mg | ORAL_TABLET | Freq: Three times a day (TID) | ORAL | 1 refills | Status: DC

## 2016-09-03 MED ORDER — EPINEPHRINE 0.3 MG/0.3ML IJ SOAJ: 0.3000 mg | Freq: Once | INTRAMUSCULAR | 1 refills | Status: AC

## 2016-09-03 MED ORDER — POTASSIUM CHLORIDE CRYS ER 10 MEQ PO TBCR: 10.0000 meq | EXTENDED_RELEASE_TABLET | Freq: Every day | ORAL | 1 refills | Status: DC

## 2016-09-03 MED ORDER — PANTOPRAZOLE SODIUM 40 MG PO TBEC: 40.0000 mg | DELAYED_RELEASE_TABLET | Freq: Every day | ORAL | 1 refills | Status: DC

## 2016-09-03 MED ORDER — TRAZODONE HCL 150 MG PO TABS: 150.0000 mg | ORAL_TABLET | Freq: Every day | ORAL | 1 refills | Status: DC

## 2016-09-03 MED ORDER — DILTIAZEM HCL ER 60 MG PO CP12: 60.0000 mg | ORAL_CAPSULE | Freq: Two times a day (BID) | ORAL | 1 refills | Status: DC

## 2016-09-03 MED ORDER — OXYCODONE-ACETAMINOPHEN 5-325 MG PO TABS: 1.0000 | ORAL_TABLET | Freq: Two times a day (BID) | ORAL | 0 refills | Status: DC | PRN

## 2016-09-03 MED ORDER — ALBUTEROL SULFATE HFA 108 (90 BASE) MCG/ACT IN AERS: 2.0000 | INHALATION_SPRAY | Freq: Four times a day (QID) | RESPIRATORY_TRACT | 2 refills | Status: DC | PRN

## 2016-09-03 MED ORDER — APIXABAN 5 MG PO TABS: 5.0000 mg | ORAL_TABLET | Freq: Two times a day (BID) | ORAL | 1 refills | Status: DC

## 2016-09-03 MED ORDER — DICYCLOMINE HCL 20 MG PO TABS: 20.0000 mg | ORAL_TABLET | Freq: Three times a day (TID) | ORAL | 1 refills | Status: DC | PRN

## 2016-09-03 MED ORDER — OLANZAPINE 10 MG PO TABS: 10.0000 mg | ORAL_TABLET | Freq: Every day | ORAL | 1 refills | Status: DC

## 2016-09-03 MED ORDER — ESCITALOPRAM OXALATE 20 MG PO TABS: 20.0000 mg | ORAL_TABLET | Freq: Every day | ORAL | 1 refills | Status: DC

## 2016-09-03 MED ORDER — CARVEDILOL 3.125 MG PO TABS: 3.1250 mg | ORAL_TABLET | Freq: Two times a day (BID) | ORAL | 1 refills | Status: DC

## 2016-09-03 MED ORDER — FUROSEMIDE 20 MG PO TABS: 20.0000 mg | ORAL_TABLET | Freq: Every day | ORAL | 1 refills | Status: DC
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
## 2016-09-03 NOTE — BHH Group Notes (Signed)
[facility] LCSW Group Therapy   09/03/2016  9:30 am   Type of Therapy: Group Therapy   Participation Level: Active   Participation Quality: Attentive, Sharing and Supportive   Affect: Depressed and Flat   Cognitive: Alert and Oriented   Insight: Developing/Improving and Engaged   Engagement in Therapy: Developing/Improving and Engaged   Modes of Intervention: Clarification, Confrontation, Discussion, Education, Exploration, Limit-setting, Orientation, Problem-solving, Rapport Building, Art therapist, Socialization and Support   Summary of Progress/Problems: The topic for group today was emotional regulation. This group focused on both positive and negative emotion identification and allowed  group members to process ways to identify feelings, regulate negative emotions, and find healthy ways to manage internal/external emotions. Group members were asked to reflect on a time when their reaction to an emotion led to a negative outcome and explored how alternative responses using emotion regulation would have benefited them. Group members were also asked to discuss a time when emotion regulation was utilized when a negative emotion was experienced. Pt reports the pt regulates the pt's emotions by reading the pt's bible and by singing and drawing.  Pt reported at one time the pt did not want to articulate the pt's thoughts to others and then reacted in anger which caused the pt to alienate relatives.  In the future the pt intends to regulate the pt's emotions by letting the pt's feelings be known to others instead of "keeping quiet" and by using her other coping mechanisms.  Pt was polite and cooperative with the CSW and other group members and focused and attentive to the topics discussed and the sharing of others.     Alphonse Guild. Camri Molloy, MSW, LCSWA, LCAS

## 2016-09-03 NOTE — Progress Notes (Signed)
  [facility] Adult Case Management Discharge Plan :  Will you be returning to the same living situation after discharge: No.  Pt will discharge to [facility] in [place]. At discharge, do you have transportation home?: Yes,  Pt will be picked up by [facility] Do you have the ability to pay for your medications: Yes,  Medicaid  Release of information consent forms completed and in the chart;  Patient's signature needed at discharge.  Patient to Follow up at: Follow-up Information    [facility] Follow up.   Specialty:  Behavioral Health Why:  Please arrive to the walk-in clinic Monday through Friday from 9-4pm for your assessment and hospital follow up for medication management and therapy. Contact information: [place] [address] [phone]        [facility] Follow up.   Why:  Please arrive between the hours of 10am-3pm on Thursday January 4th to be admitted for long-term residential care and for assessment for eventual transition to independent living. Contact information: [facility] Attn: Kendra Opitz  [address] [place], [place] [address] Ph: [phone] Fax not required, or needed. Do not fax          Next level of care provider has access to [place] and Suicide Prevention discussed: No. Pt refused.     Has patient been referred to the Quitline?: N/A patient is not a smoker  Patient has been referred for addiction treatment: Yes  Joanne Chars, LCSW 09/03/2016, 3:39 PM

## 2016-09-03 NOTE — [facility] Suicide Risk Assessment (Signed)
[facility] Discharge Suicide Risk Assessment   Principal Problem: Schizoaffective disorder, depressive type [facility]) Discharge Diagnoses:  Patient Active Problem List   Diagnosis Date Noted  . Schizoaffective disorder, depressive type ([place]) [F25.1] 08/28/2016  . Sinusitis, acute maxillary [J01.00] 08/27/2016  . Suicide attempt [T14.91XA] 08/27/2016  . Rash and nonspecific skin eruption [R21] 04/16/2016  . Colitis [K52.9] 04/08/2016  . Tremor [R25.1] 03/19/2016  . Left shoulder pain [M25.512] 02/18/2016  . Abdominal distension [R14.0] 02/18/2016  . Absolute anemia [D64.9] 01/29/2016  . Chronic pain [G89.29] 01/29/2016  . Neurogenic pain [M79.2] 01/29/2016  . Musculoskeletal pain [M79.1] 01/29/2016  . Long term current use of opiate analgesic [Z79.891] 01/29/2016  . Long term prescription opiate use [Z79.891] 01/29/2016  . Opiate use (15 MME/Day) [F11.90] 01/29/2016  . Encounter for therapeutic drug level monitoring [Z51.81] 01/29/2016  . Encounter for pain management planning [Z01.89] 01/29/2016  . Chronic neck pain (midline) [M54.2, G89.29] 01/29/2016  . Chronic chest wall pain [R07.89, G89.29] 01/29/2016  . Chronic low back pain (Location of Secondary source of pain) (Bilateral) (R>L) [M54.5, G89.29] 01/29/2016  . Disturbance of skin sensation [R20.9] 01/29/2016  . Chronic shoulder pain (Location of Primary Source of Pain) (Left) [M25.512, G89.29] 01/29/2016  . Chronic hip pain (Location of Tertiary source of pain) (Bilateral) (R>L) [M25.559, G89.29] 01/29/2016  . Osteoarthritis of hip (Bilateral) [M16.0] 01/29/2016  . Lumbar facet arthropathy (Severe at L2-3, L3-4, L4-5, and L5-S1) [M12.88] 01/29/2016  . Lumbar central spinal stenosis (moderate at L4-5; mild at L2-3 and L3-4) [M48.061] 01/29/2016  . Lumbar foraminal stenosis (L4-5) (Right) [M99.83] 01/29/2016  . Lumbar facet syndrome [M12.88] 01/29/2016  . Hx of cervical spine surgery [Z98.890] 01/29/2016  . Numbness [R20.0] 11/19/2015   . Right wrist pain [M25.531] 11/09/2015  . Breast pain [N64.4] 10/19/2015  . Chest pain [R07.9] 10/14/2015  . Headache [R51] 10/14/2015  . Abdominal pain [R10.9] 10/14/2015  . Depression [F32.9] 10/14/2015  . Lip swelling [R22.0] 10/14/2015  . Pulmonary embolism ([place]) [I26.99] 08/15/2015  . Bilateral pneumonia [J18.9] 08/15/2015  . Nausea [R11.0] 08/15/2015  . Shortness of breath [R06.02] 07/31/2015  . Pleural effusion, bilateral [J90] 07/31/2015  . Cough [R05] 07/31/2015  . Chronic pain of multiple sites [R52, G89.29] 06/05/2015  . Chronic lumbosacral pain [M54.5, G89.29] 06/05/2015  . Esophageal spasm [K22.4] 06/05/2015  . History of DVT (deep vein thrombosis) [Z86.718] 02/25/2015  . Hypercementosis [K03.4] 02/13/2015  . Constipation [K59.00] 12/12/2014  . Coronary vasospasm ([place]) [I20.1] 12/09/2014  . History of pulmonary embolus (PE) [Z86.711] 09/19/2014  . Impingement syndrome of shoulder [M75.40] 08/30/2014  . Impingement syndrome of left shoulder [M75.42] 08/30/2014  . Injury of superior glenoid labrum of shoulder joint [S43.439A] 08/16/2014  . Supraspinatus tenosynovitis [M65.819] 08/16/2014  . Cervical spondylosis without myelopathy [M47.812] 08/16/2014  . Anxiety and depression [F41.8] 04/13/2014  . Osteoarthritis of hip (Left) [M16.12] 04/11/2014  . S/P THR (total hip replacement) (Left) [id number] 04/11/2014  . IBS (irritable bowel syndrome) [K58.9] 04/11/2014  . GERD (gastroesophageal reflux disease) [K21.9] 04/11/2014  . Insomnia [G47.00] 04/11/2014  . Essential hypertension, benign [I10] 10/28/2013  . Left hip pain [M25.552] 10/28/2013  . Anemia [D64.9] 10/28/2013  . HLD (hyperlipidemia) [E78.5] 10/28/2013    Total Time spent with patient: 30 minutes  Musculoskeletal: Strength & Muscle Tone: within normal limits Gait & Station: normal Patient leans: N/A  Psychiatric Specialty Exam: Review of Systems  Musculoskeletal: Positive for back pain and joint  pain.  All other systems reviewed and are negative.   Blood  pressure 125/70, pulse 76, temperature 98.6 F (37 C), temperature source Oral, resp. rate 18, height 5\' 6"  (1.676 m), weight 86.2 kg (190 lb), SpO2 100 %.Body mass index is 30.67 kg/m.  General Appearance: Casual  Eye Contact::  Good  Speech:  Clear and Coherent409  Volume:  Normal  Mood:  Euthymic  Affect:  Appropriate  Thought Process:  Goal Directed and Descriptions of Associations: Intact  Orientation:  Full (Time, Place, and Person)  Thought Content:  WDL  Suicidal Thoughts:  No  Homicidal Thoughts:  No  Memory:  Immediate;   Fair Recent;   Fair Remote;   Fair  Judgement:  Impaired  Insight:  Present  Psychomotor Activity:  Normal  Concentration:  Fair  Recall:  [employer] of Knowledge:Fair  Language: Fair  Akathisia:  No  Handed:  Right  AIMS (if indicated):     Assets:  Communication Skills Desire for Improvement Financial Resources/Insurance Resilience  Sleep:  Number of Hours: 5.45  Cognition: WNL  ADL's:  Intact   Mental Status Per Nursing Assessment::   On Admission:     Demographic Factors:  Divorced or widowed and Low socioeconomic status  Loss Factors: NA  Historical Factors: Prior suicide attempts, Family history of mental illness or substance abuse and Impulsivity  Risk Reduction Factors:   Sense of responsibility to family and Positive therapeutic relationship  Continued Clinical Symptoms:  Bipolar Disorder:   Depressive phase  Cognitive Features That Contribute To Risk:  None    Suicide Risk:  Minimal: No identifiable suicidal ideation.  Patients presenting with no risk factors but with morbid ruminations; may be classified as minimal risk based on the severity of the depressive symptoms  [place] Follow up.   Why:  DO NOT SEND (INCOMPLETE). Please schedule appointment.  Contact information: [address] [place]  [id number] [phone]           Plan Of Care/Follow-up recommendations:  Activity:  as tolerated. Diet:  low sodium heart healthy. Other:  keep follow up appointments.  Orson Slick, MD 09/03/2016, 11:29 AM

## 2016-09-03 NOTE — Progress Notes (Signed)
Patient with appropriate affect, cooperative behavior with meals, meds and plan of care. No SI/HI/AVH at this time. Pleasant and talkative, appropriate with staff and peers. Ambulates with slow and steady gait with walker, remains on falls protocol. Safety maintained.

## 2016-09-03 NOTE — Plan of Care (Signed)
Problem: Activity: Goal: Sleeping patterns will improve Outcome: Progressing Patient slept for Estimated Hours of 5.45; safety maintained, no injury or falls during this shift.

## 2016-09-03 NOTE — Progress Notes (Signed)
Recreation Therapy Notes  Date: 01.03.17 Time: 1:00 pm Location: Craft Room  Group Topic: Self-esteem  Goal Area(s) Addresses:  Patient will identify at least one positive trait about self. Patient will identify one healthy coping skill.  Behavioral Response: Attentive, Left early  Intervention: All About Me  Activity: Patients were instructed to make a pamphlet including their life's motto, positive traits, healthy coping skills, and their support system.  Education: LRT educated patients on ways to improve their self-esteem.  Education Outcome: Patient left before LRt educated group.  Clinical Observations/Feedback: Patient completed activity by writing positive traits, healthy coping skills, and her support system. Patient left group at approximately 1:40 pm with the social worker and did not return to group.  Leonette Monarch, LRT/CTRS 09/03/2016 3:54 PM

## 2016-09-04 IMAGING — CT CT ABD-PELV W/ CM
2 of 5 series · 11 of 46 positions shown, 12 images · IV contrast (Iodine)
Comparison: 06/22/2014

CLINICAL DATA: Mid abdominal pain x3-4 days, nausea, constipation

EXAM:
CT ABDOMEN AND PELVIS WITH CONTRAST
TECHNIQUE: Multidetector CT imaging of the abdomen and pelvis was performed
using the standard protocol following bolus administration of
intravenous contrast.
CONTRAST:  100mL OMNIPAQUE IOHEXOL 300 MG/ML  SOLN

[Series 201: routine, idose (2) · axial · 0.75mm/px · z∈[-839,-464]mm · 8 of 97 slices shown, 9 images]
[im 11/97  soft-tissue]
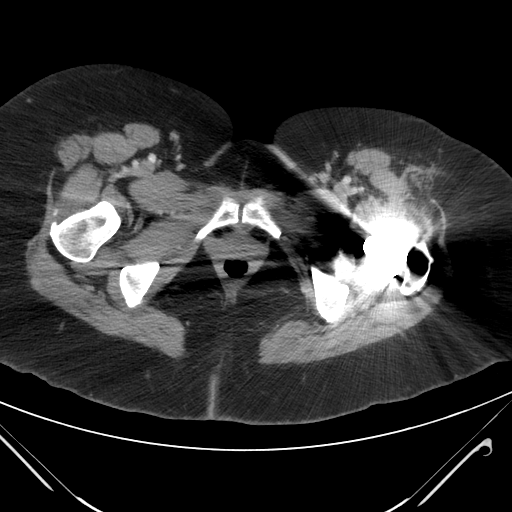
[im 11/97  bone]
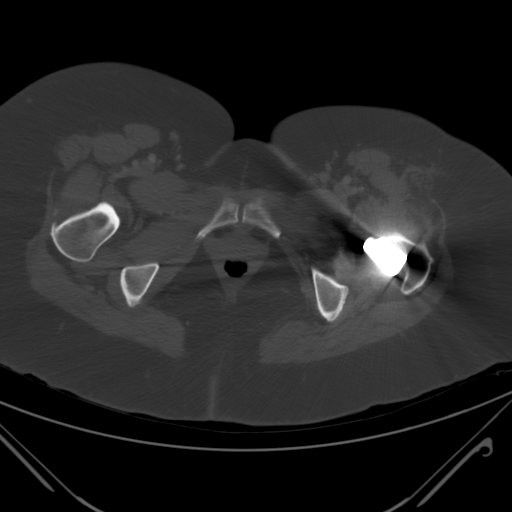
[im 22/97  soft-tissue]
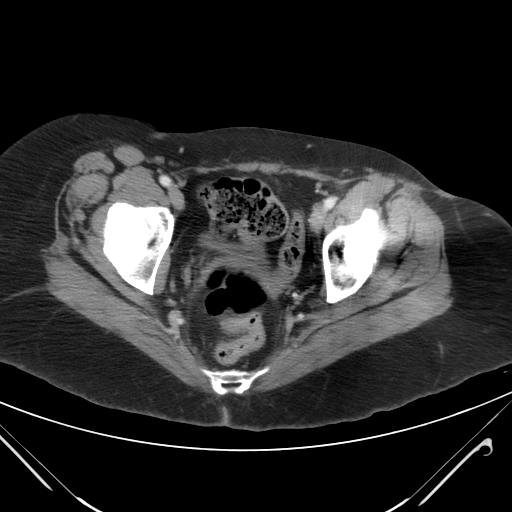
[im 33/97  soft-tissue]
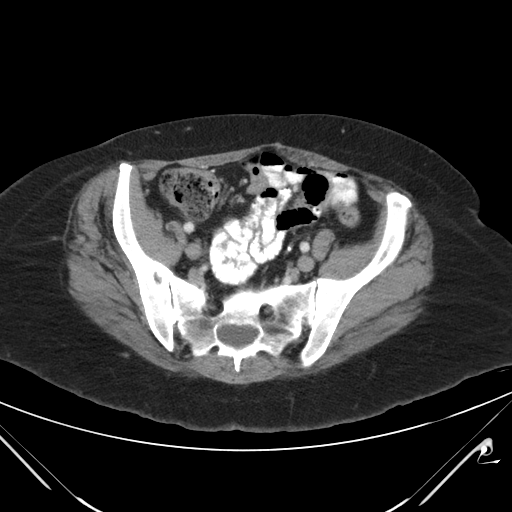
[im 43/97  soft-tissue]
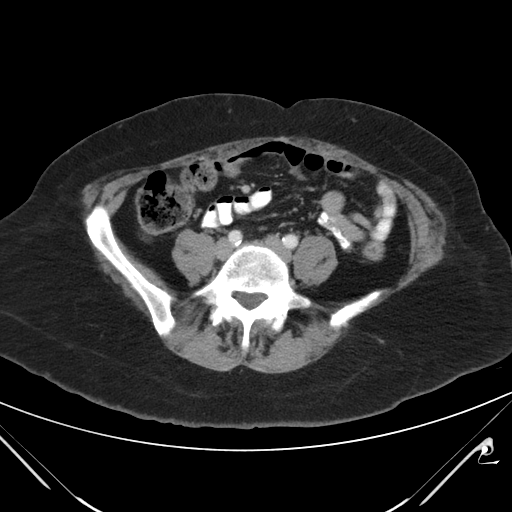
[im 54/97  soft-tissue]
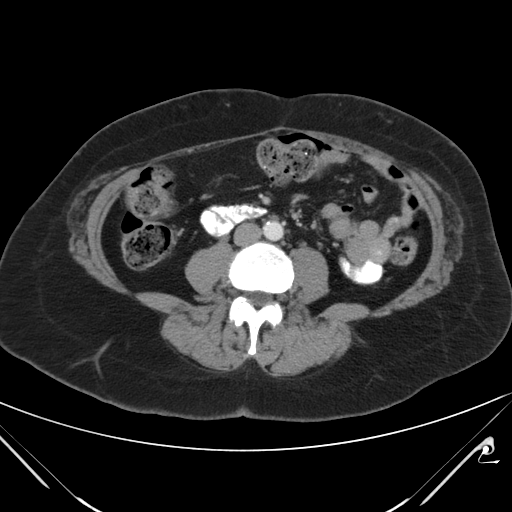
[im 65/97  soft-tissue]
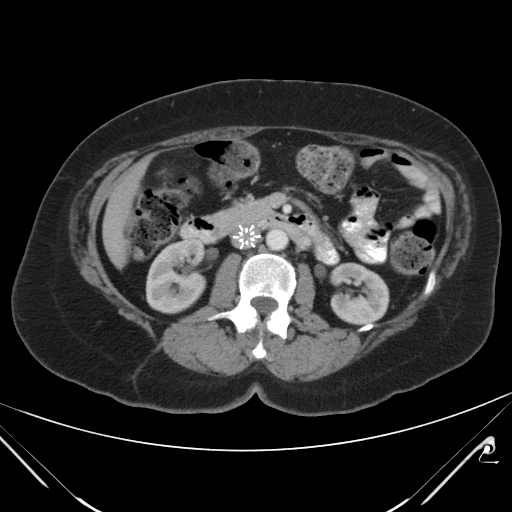
[im 75/97  soft-tissue]
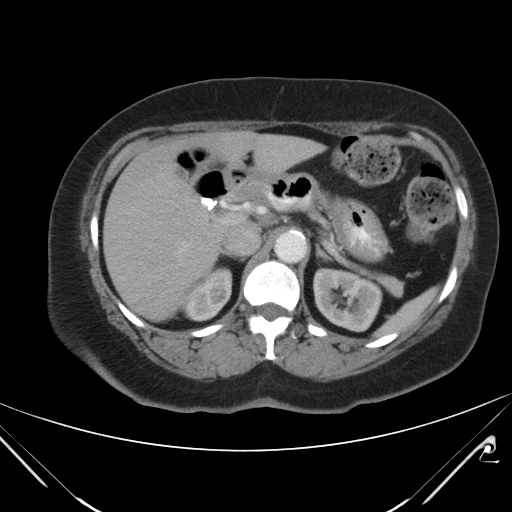
[im 86/97  soft-tissue]
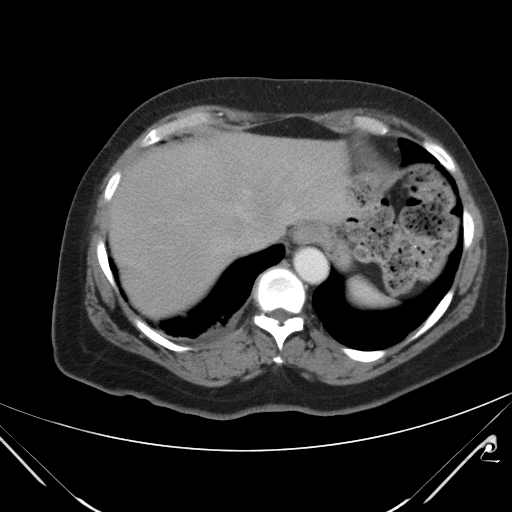

[Series 202: coronals, idose (2) · coronal · 0.45mm/px · 3 of 109 slices shown]
[im 37/109  soft-tissue]
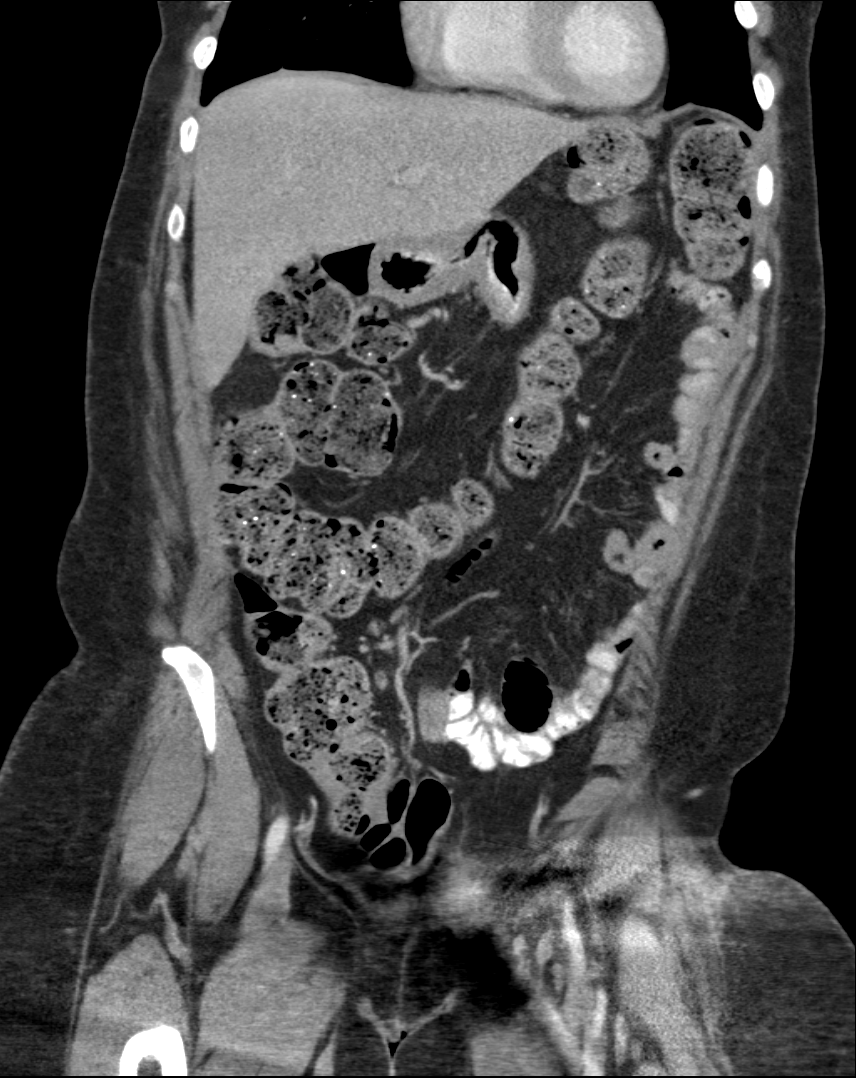
[im 49/109  soft-tissue]
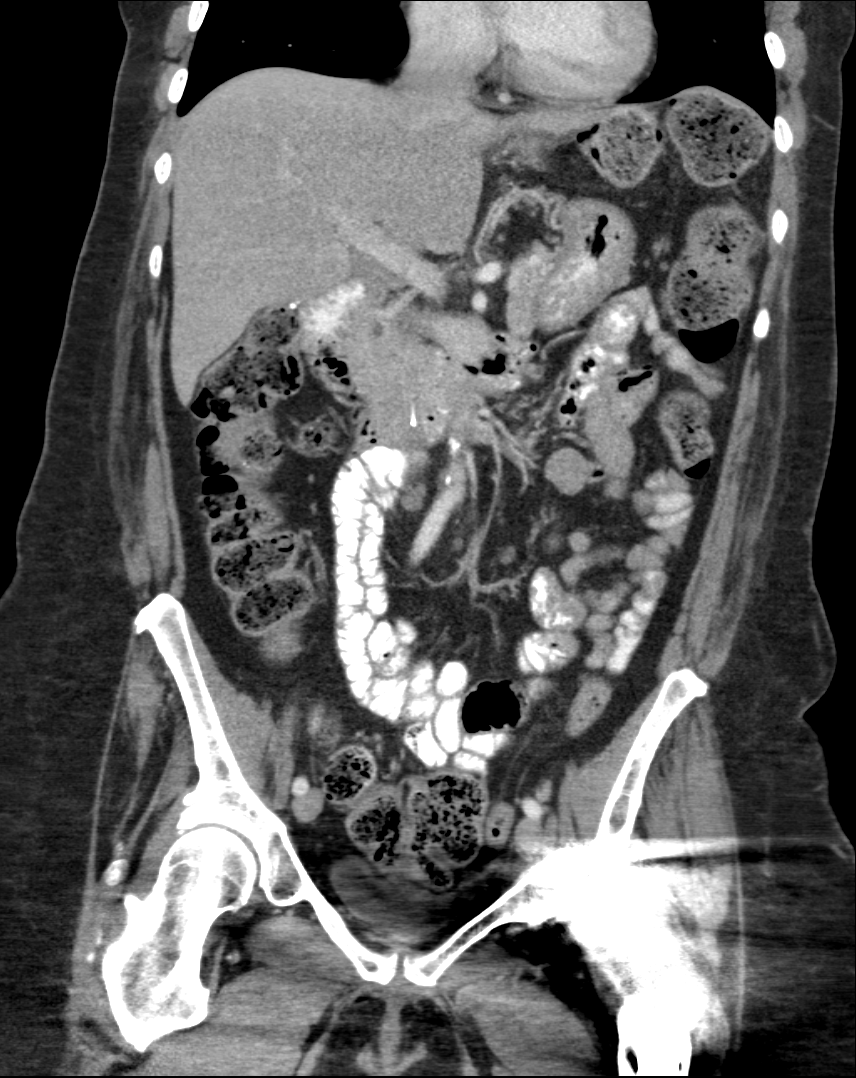
[im 61/109  soft-tissue]
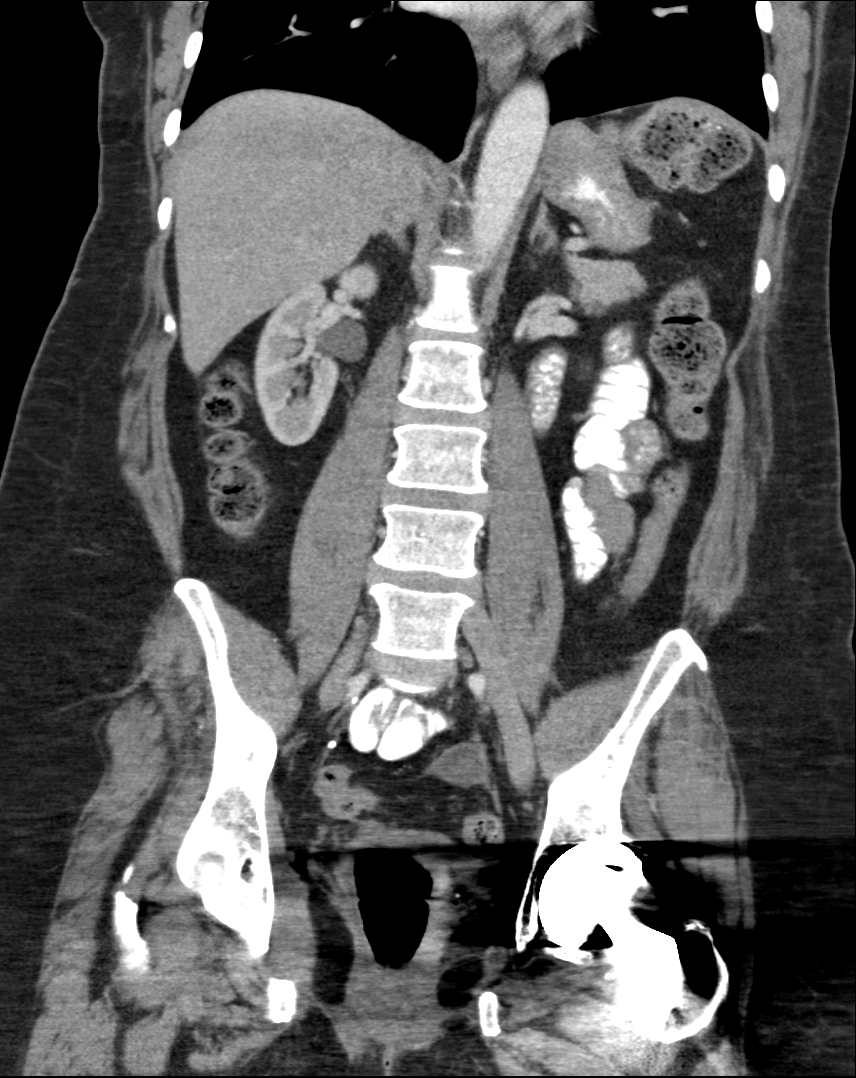

[11 of 46 positions shown; findings below may reference images not displayed]

FINDINGS: Lower chest:  Trace right pleural effusion.

Hepatobiliary: Liver is within normal limits. No
suspicious/enhancing hepatic lesions.

Status post cholecystectomy. No intrahepatic or extrahepatic ductal
dilatation.

Pancreas: Within normal limits.

Spleen: Within normal limits.

Adrenals/Urinary Tract: Adrenal glands are within normal limits.

Kidneys are Within normal limits.  No hydronephrosis.

Bladder is underdistended and partially obscured by streak artifact.

Stomach/Bowel: Stomach is within normal limits.

No evidence of bowel obstruction.

Prior appendectomy.

Moderate right colonic stool burden, suggesting constipation.

Vascular/Lymphatic: Atherosclerotic calcifications of the abdominal
aorta and branch vessels.

IVC filter.

No suspicious abdominopelvic lymphadenopathy.

Reproductive: Status post hysterectomy.

2.0 cm left ovarian cyst (series 21/ image 70), unchanged.

Right ovary is within normal limits.

Other: No abdominopelvic ascites.

Musculoskeletal: Visualized thoracolumbar spine is within normal
limits.

Left hip arthroplasty in satisfactory position.
IMPRESSION: No evidence of bowel obstruction.  Prior appendectomy.

Moderate right colonic stool burden, suggesting constipation.

Additional stable ancillary findings as above.

## 2016-09-04 NOTE — Discharge Summary (Signed)
Physician Discharge Summary Note  Patient:  Valerie Vazquez is an 61 y.o., female MRN:  [id number] DOB:  [birth year] Patient phone:  [phone] (home)  Patient address:   [place] [address],  Total Time spent with patient: 30 minutes  Date of Admission:  08/28/2016 Date of Discharge: 09/04/2016  Reason for Admission:  Suicidal ideation.  History of Present Illness: 61 year old woman presented to the emergency room referred by her primary care doctor. Patient reports severe depression that has been going on since the summer of this year. Mood feels sad and depressed all the time. Feels increasingly hopeless. Has no feeling of anything she enjoys in her life. Fatigued all the time. Physically feels sick a lot of the time. She says she is having hallucinations which are chronic but have been worse recently. Says that she has been compliant with her psychiatric medicine which currently just is Lexapro. Hasn't been helping. Major stress came from the death of her significant other this summer. More acutely she is being thrown out of the home she has been living in.  Associated Signs/Symptoms: Depression Symptoms:  depressed mood, anhedonia, psychomotor retardation, fatigue, feelings of worthlessness/guilt, hopelessness, suicidal thoughts with specific plan, (Hypo) Manic Symptoms:  None Anxiety Symptoms:  Excessive Worry, Psychotic Symptoms:  Hallucinations: Auditory PTSD Symptoms: Negative  Past Psychiatric History: patient has a history of a diagnosis of schizoaffective disorder. Last psychiatric hospitalization she recalls was in 2014. That was in [place]. She says she has tried to kill her self more than once in the past. Current medication has only been Lexapro. She says she has been on multiple antipsychotics in the past as well as antidepressants. She has several allergies listed. She cannot recall any medicine that she definitely thinks was helpful in the past.  Prior  Inpatient Therapy:  no records in our computer but she says she has had hospitalizations the last one was in [place]. Prior Outpatient Therapy:  currently she says she is just getting treatment from her primary care doctor.  Family Psychiatric  History: 2 sons both have psychotic disorders   Social History:patient has been living with some friends. She says they're putting her out of the house and she has no place to live. Has no family she trusts or gets along with. Had been trying she says to move to [place] a while ago for reasons that are somewhat vague.   Principal Problem: Schizoaffective disorder, depressive type [facility]) Discharge Diagnoses: Patient Active Problem List   Diagnosis Date Noted  . Schizoaffective disorder, depressive type ([place]) [F25.1] 08/28/2016  . Sinusitis, acute maxillary [J01.00] 08/27/2016  . Suicide attempt [T14.91XA] 08/27/2016  . Rash and nonspecific skin eruption [R21] 04/16/2016  . Colitis [K52.9] 04/08/2016  . Tremor [R25.1] 03/19/2016  . Left shoulder pain [M25.512] 02/18/2016  . Abdominal distension [R14.0] 02/18/2016  . Absolute anemia [D64.9] 01/29/2016  . Chronic pain [G89.29] 01/29/2016  . Neurogenic pain [M79.2] 01/29/2016  . Musculoskeletal pain [M79.1] 01/29/2016  . Long term current use of opiate analgesic [Z79.891] 01/29/2016  . Long term prescription opiate use [Z79.891] 01/29/2016  . Opiate use (15 MME/Day) [F11.90] 01/29/2016  . Encounter for therapeutic drug level monitoring [Z51.81] 01/29/2016  . Encounter for pain management planning [Z01.89] 01/29/2016  . Chronic neck pain (midline) [M54.2, G89.29] 01/29/2016  . Chronic chest wall pain [R07.89, G89.29] 01/29/2016  . Chronic low back pain (Location of Secondary source of pain) (Bilateral) (R>L) [M54.5, G89.29] 01/29/2016  . Disturbance of skin sensation [R20.9] 01/29/2016  .  Chronic shoulder pain (Location of Primary Source of Pain) (Left) [M25.512, G89.29] 01/29/2016  . Chronic hip  pain (Location of Tertiary source of pain) (Bilateral) (R>L) [M25.559, G89.29] 01/29/2016  . Osteoarthritis of hip (Bilateral) [M16.0] 01/29/2016  . Lumbar facet arthropathy (Severe at L2-3, L3-4, L4-5, and L5-S1) [M12.88] 01/29/2016  . Lumbar central spinal stenosis (moderate at L4-5; mild at L2-3 and L3-4) [M48.061] 01/29/2016  . Lumbar foraminal stenosis (L4-5) (Right) [M99.83] 01/29/2016  . Lumbar facet syndrome [M12.88] 01/29/2016  . Hx of cervical spine surgery [Z98.890] 01/29/2016  . Numbness [R20.0] 11/19/2015  . Right wrist pain [M25.531] 11/09/2015  . Breast pain [N64.4] 10/19/2015  . Chest pain [R07.9] 10/14/2015  . Headache [R51] 10/14/2015  . Abdominal pain [R10.9] 10/14/2015  . Depression [F32.9] 10/14/2015  . Lip swelling [R22.0] 10/14/2015  . Pulmonary embolism ([place]) [I26.99] 08/15/2015  . Bilateral pneumonia [J18.9] 08/15/2015  . Nausea [R11.0] 08/15/2015  . Shortness of breath [R06.02] 07/31/2015  . Pleural effusion, bilateral [J90] 07/31/2015  . Cough [R05] 07/31/2015  . Chronic pain of multiple sites [R52, G89.29] 06/05/2015  . Chronic lumbosacral pain [M54.5, G89.29] 06/05/2015  . Esophageal spasm [K22.4] 06/05/2015  . History of DVT (deep vein thrombosis) [Z86.718] 02/25/2015  . Hypercementosis [K03.4] 02/13/2015  . Constipation [K59.00] 12/12/2014  . Coronary vasospasm ([place]) [I20.1] 12/09/2014  . History of pulmonary embolus (PE) [Z86.711] 09/19/2014  . Impingement syndrome of shoulder [M75.40] 08/30/2014  . Impingement syndrome of left shoulder [M75.42] 08/30/2014  . Injury of superior glenoid labrum of shoulder joint [S43.439A] 08/16/2014  . Supraspinatus tenosynovitis [M65.819] 08/16/2014  . Cervical spondylosis without myelopathy [M47.812] 08/16/2014  . Anxiety and depression [F41.8] 04/13/2014  . Osteoarthritis of hip (Left) [M16.12] 04/11/2014  . S/P THR (total hip replacement) (Left) [id number] 04/11/2014  . IBS (irritable bowel syndrome) [K58.9]  04/11/2014  . GERD (gastroesophageal reflux disease) [K21.9] 04/11/2014  . Insomnia [G47.00] 04/11/2014  . Essential hypertension, benign [I10] 10/28/2013  . Left hip pain [M25.552] 10/28/2013  . Anemia [D64.9] 10/28/2013  . HLD (hyperlipidemia) [E78.5] 10/28/2013    Past Medical History:  Past Medical History:  Diagnosis Date  . Anemia   . Bilateral renal cysts   . Chronic back pain    "upper and lower" (09/19/2014)  . Depression    "major" (09/19/2014)  . DVT (deep venous thrombosis) ([place])   . Esophageal spasm   . Gallstones   . Gastritis   . Gastroenteritis   . GERD (gastroesophageal reflux disease)   . Headache    "couple /month lately" (09/19/2014)  . High blood pressure   . High cholesterol   . History of blood transfusion 1985   related to hysterectomy  . Hypertension   . IBS (irritable bowel syndrome)   . Migraine    "a few times/yr" (09/19/2014)  . Myocardial infarction 10/2010 X 3   "while hospitalized"  . Osteoarthritis    "qwhere; mostly around my joints" (09/19/2014)  . Pneumonia 04/2014  . PTSD (post-traumatic stress disorder)   . Pulmonary embolism ([place]) 04/2001; 09/19/2014   "after gallbladder OR; "  . Schizoaffective disorder ([place])   . Sleep apnea    "mild" (09/19/2014)  . Snoring    a. sleep study 5/16: No OSA  . Spinal stenosis   . Spondylosis   . Type II diabetes mellitus ([place])    "dx'd in 2007; lost weight; no RX for ~ 4 yr now" (09/19/2014)    Past Surgical History:  Procedure Laterality Date  . ABDOMINAL HYSTERECTOMY  [place]than DECOMP/DISCECTOMY FUSION  12-12-2012  . APPENDECTOMY  1985  . BACK SURGERY    . BREAST CYST EXCISION Right   . BREAST EXCISIONAL BIOPSY Right YRS AGO   NEG  . CARDIAC CATHETERIZATION   2000's; 2007/12/13  . CHOLECYSTECTOMY  12-12-2000  . EXCISION/RELEASE BURSA HIP Right 12/1984  . HERNIA REPAIR  1985  . LEFT HEART CATHETERIZATION WITH CORONARY ANGIOGRAM N/A 12/09/2014   Procedure: LEFT HEART CATHETERIZATION WITH  CORONARY ANGIOGRAM;  Surgeon: Burnell Blanks, MD;  Location[facility] LAB;  Service: Cardiovascular;  Laterality: N/A;  . TONSILLECTOMY AND ADENOIDECTOMY  ~ 12/13/1966  . TOTAL HIP ARTHROPLASTY Left 04-06-2014  . UMBILICAL HERNIA REPAIR  1985  . VENA CAVA FILTER PLACEMENT  03/2014   Family History:  Family History  Problem Relation Age of Onset  . Stroke Mother     Deceased  . Lung cancer Father     Deceased  . Healthy Daughter   . Healthy Son     x 2  . Other Son     Suicide  . Heart attack Neg Hx    Social History:  History  Alcohol Use No     History  Drug Use No    Social History   Social History  . Marital status: Legally Separated    Spouse name: N/A  . Number of children: N/A  . Years of education: N/A   Social History Main Topics  . Smoking status: Former Smoker    Packs/day: 1.50    Years: 10.00    Types: Cigarettes    Quit date: 04/12/1979  . Smokeless tobacco: Never Used  . Alcohol use No  . Drug use: No  . Sexual activity: Not Currently   Other Topics Concern  . None   Social History Narrative   Lives with fiancee in an apartment on the first floor.  Has 3 grown children.  One child passed away.  On disability 13-Dec-1994 - 2000, 2007 - current.     Education: some college.    Hospital Course:    [name] is a 61 year old female with a history of schizoaffective disorder admitted here for worsening of depression and suicidal ideation.  1. Suicidal ideation. Resolved. The patient is able to contract for safety. She is forward thinking and more optimistic about the future.   2. Mood and psychosis. We gave Zyprexa for mood stabilization and Lexapro for depression.    3. COPD. She was on Albuterol. The patient completed Levaquin course for bronchitis.   4. Hypertension and coronary artery disease. She is on Coreg, Cardizem, Lasix, Imdur, and potassium.  5. Chronic pain. She was given Neurontin, oxycodone, and Flexeril as in the community. No  prescriptions for narcotics were given.  6. IBS. She is on Bentyl and Protonix.   7. History of pulmonary embolism. She is on a Eliquis. She was ruled out for DVT. Medicine consult is greatly appreciated.   8. Metabolic syndrome monitoring. Cholesterol is elevated, TSH is normal, Hemoglobin A1c 5.9.     9. Fall risk. PT input is greatly appreciated. We provided a walker.    10. Insomnia. We will increase Trazodone.    11. Disposition. She was discharged to transitional housing. She will [facility] with [facility].     Physical Findings: AIMS:  , ,  ,  ,    CIWA:    COWS:     Musculoskeletal: Strength & Muscle Tone: within normal limits Gait &  Station: normal Patient leans: N/A  Psychiatric Specialty Exam: Physical Exam  Nursing note and vitals reviewed.   Review of Systems  Musculoskeletal: Positive for back pain and joint pain.  All other systems reviewed and are negative.   Blood pressure 129/80, pulse 78, temperature 98.2 F (36.8 C), resp. rate 18, height 5\' 6"  (1.676 m), weight 86.2 kg (190 lb), SpO2 100 %.Body mass index is 30.67 kg/m.  General Appearance: Casual  Eye Contact:  Good  Speech:  Clear and Coherent  Volume:  Normal  Mood:  Euthymic  Affect:  Appropriate  Thought Process:  Goal Directed and Descriptions of Associations: Intact  Orientation:  Full (Time, Place, and Person)  Thought Content:  WDL  Suicidal Thoughts:  No  Homicidal Thoughts:  No  Memory:  Immediate;   Fair Recent;   Fair Remote;   Fair  Judgement:  Impaired  Insight:  Present  Psychomotor Activity:  Normal  Concentration:  Concentration: Fair and Attention Span: Fair  Recall:  [employer] of Knowledge:  Fair  Language:  Fair  Akathisia:  No  Handed:  Left  AIMS (if indicated):     Assets:  Communication Skills Desire for Improvement Financial Resources/Insurance Resilience Social Support Transportation  ADL's:  Intact  Cognition:  WNL  Sleep:  Number of Hours: 5.5         Has this patient used any form of tobacco in the last 30 days? (Cigarettes, Smokeless Tobacco, Cigars, and/or Pipes) Yes, No  Blood Alcohol level:  Lab Results  Component Value Date   ETH <5 [id number]    Metabolic Disorder Labs:  Lab Results  Component Value Date   HGBA1C 5.9 (H) 08/29/2016   MPG 123 08/29/2016   MPG 140 (H) 09/19/2014   No results found for: PROLACTIN Lab Results  Component Value Date   CHOL 251 (H) 08/29/2016   TRIG 83 08/29/2016   HDL 38 (L) 08/29/2016   CHOLHDL 6.6 08/29/2016   VLDL 17 08/29/2016   LDLCALC 196 (H) 08/29/2016    See Psychiatric Specialty Exam and Suicide Risk Assessment completed by Attending Physician prior to discharge.  Discharge destination:  Other:  transitional jousing.  Is patient on multiple antipsychotic therapies at discharge:  No   Has Patient had three or more failed trials of antipsychotic monotherapy by history:  No  Recommended Plan for Multiple Antipsychotic Therapies: NA  Discharge Instructions    Diet - low sodium heart healthy    Complete by:  As directed    Diet - low sodium heart healthy    Complete by:  As directed    Increase activity slowly    Complete by:  As directed    Increase activity slowly    Complete by:  As directed      Allergies as of 09/04/2016      Reactions   Abilify [aripiprazole] Other (See Comments)   Jerking movement, slow motor skills   Augmentin [amoxicillin-pot Clavulanate] Diarrhea, Nausea And Vomiting   Bactrim [sulfamethoxazole-trimethoprim] Diarrhea   Ceftin [cefuroxime Axetil] Diarrhea   Coconut Oil    Rash    Diclofenac Other (See Comments)    Muscle Pain   Dye Fdc Red [red Dye]    "hair dye"   Indocin [indomethacin] Other (See Comments)   Migraines    Linaclotide Diarrhea   Lisinopril Diarrhea, Other (See Comments)   Other reaction(s): Dizziness, Vomiting   Morphine Other (See Comments)   Chest Tightness   Morphine And Related  Other (See Comments)    Chest tightness    Simvastatin Other (See Comments)   Muscle Pain   Sulfa Antibiotics Diarrhea   Sulfamethoxazole-trimethoprim Diarrhea   Wellbutrin [bupropion] Other (See Comments)   Jerking movement Slurred speech   Quetiapine Fumarate Palpitations      Medication List    STOP taking these medications   benzonatate 100 MG capsule Commonly known as:  TESSALON   calcium carbonate 500 MG chewable tablet Commonly known as:  TUMS - dosed in mg elemental calcium   diphenhydrAMINE 25 MG tablet Commonly known as:  BENADRYL   EPINEPHrine 0.3 mg/0.3 mL Soaj injection Commonly known as:  EPI-PEN   multivitamin with minerals Tabs tablet   mupirocin ointment 2 % Commonly known as:  BACTROBAN   ondansetron 4 MG tablet Commonly known as:  ZOFRAN   oxyCODONE-acetaminophen 5-325 MG tablet Commonly known as:  PERCOCET/ROXICET   potassium chloride 10 MEQ tablet Commonly known as:  K-DUR   triamcinolone 55 MCG/ACT Aero nasal inhaler Commonly known as:  NASACORT AQ     TAKE these medications     Indication  albuterol 108 (90 Base) MCG/ACT inhaler Commonly known as:  PROVENTIL HFA;VENTOLIN HFA Inhale 2 puffs into the lungs every 6 (six) hours as needed for wheezing or shortness of breath.  Indication:  Chronic Obstructive Lung Disease   apixaban 5 MG Tabs tablet Commonly known as:  ELIQUIS Take 1 tablet (5 mg total) by mouth 2 (two) times daily.  Indication:  Blood Clot in a Deep Vein   carvedilol 3.125 MG tablet Commonly known as:  COREG Take 1 tablet (3.125 mg total) by mouth 2 (two) times daily with a meal.  Indication:  Chronic Angina Following Exercise, Stress, or Excitement   dicyclomine 20 MG tablet Commonly known as:  BENTYL Take 1 tablet (20 mg total) by mouth 3 (three) times daily as needed for spasms. What changed:  Another medication with the same name was removed. Continue taking this medication, and follow the directions you see here.  Indication:  Irritable  Bowel Syndrome   diltiazem 60 MG 12 hr capsule Commonly known as:  CARDIZEM SR Take 1 capsule (60 mg total) by mouth every 12 (twelve) hours. What changed:  when to take this  reasons to take this  Indication:  High Blood Pressure Disorder   escitalopram 20 MG tablet Commonly known as:  LEXAPRO Take 1 tablet (20 mg total) by mouth daily. What changed:  medication strength  how much to take  Indication:  Major Depressive Disorder   furosemide 20 MG tablet Commonly known as:  LASIX Take 1 tablet (20 mg total) by mouth daily. What changed:  See the new instructions.  Indication:  Edema   gabapentin 600 MG tablet Commonly known as:  NEURONTIN Take 0.5 tablets (300 mg total) by mouth 3 (three) times daily. What changed:  when to take this  Indication:  Neurogenic Pain   isosorbide mononitrate 30 MG 24 hr tablet Commonly known as:  IMDUR Take 1 tablet (30 mg total) by mouth daily.  Indication:  Stable Angina Pectoris   OLANZapine 10 MG tablet Commonly known as:  ZYPREXA Take 1 tablet (10 mg total) by mouth at bedtime.  Indication:  Manic-Depression   pantoprazole 40 MG tablet Commonly known as:  PROTONIX Take 1 tablet (40 mg total) by mouth daily. What changed:  See the new instructions.  Indication:  Gastroesophageal Reflux Disease   potassium chloride 10 MEQ tablet Commonly known as:  K-DUR,KLOR-CON Take 1 tablet (10 mEq total) by mouth daily.  Indication:  Low Amount of Potassium in the Blood   traZODone 150 MG tablet Commonly known as:  DESYREL Take 1 tablet (150 mg total) by mouth at bedtime. What changed:  medication strength  how much to take  Indication:  Trouble Sleeping     ASK your doctor about these medications     Indication  EPINEPHrine 0.3 mg/0.3 mL Soaj injection Commonly known as:  EPI-PEN Inject 0.3 mLs (0.3 mg total) into the muscle once. Ask about: Should I take this medication?  Indication:  Life-Threatening Allergic Reaction             Durable Medical Equipment        Start     Ordered   09/02/16 1334  For home use only DME Walker rolling  Once    Question:  Patient needs a walker to treat with the following condition  Answer:  Falls   09/02/16 1333     Follow-up Information    [facility] Follow up.   Specialty:  Behavioral Health Why:  Please arrive to the walk-in clinic Monday through Friday from 9-4pm for your assessment and hospital follow up for medication management and therapy. Contact information: [place] [address] [phone]        [facility] [place] Follow up.   Why:  Please arrive between the hours of 10am-3pm on Thursday January 4th to be admitted for long-term residential care and for assessment for eventual transition to independent living. Contact information: [facility] Attn: Kendra Opitz  [address] [place], [place] [address] Ph: [phone] Fax not required, or needed. Do not fax          Follow-up recommendations:  Activity:  As tolerated. Diet:  Low sodium heart healthy. Other:  Keep follow-up appointments.  Comments:    Signed: Orson Slick, MD 09/04/2016, 2:25 PM

## 2016-09-04 NOTE — NC FL2 (Signed)
[place] LEVEL OF CARE SCREENING TOOL     IDENTIFICATION  Patient Name: Valerie Vazquez Birthdate: November 22, 1955 Sex: female Admission Date (Current Location): 08/28/2016  [place] and [place] Number:  Selena Lesser [id number] [place] and Address:  [facility], [address] [place],  [address]      Provider Number: [id number]  Attending Physician Name and Address:  Clovis Fredrickson, MD  Relative Name and Phone Number:  n/a    Current Level of Care: Hospital Recommended Level of Care: Other (Comment) (Trans[place] to Independent Living) Prior Approval Number: n/a  Date Approved/Denied:  (n/a) PASRR Number: n/a  Discharge Plan: Other (Comment) (Transitional to Independent Living)    Current Diagnoses: Patient Active Problem List   Diagnosis Date Noted  . Schizoaffective disorder, depressive type ([place]) 08/28/2016  . Sinusitis, acute maxillary 08/27/2016  . Suicide attempt 08/27/2016  . Rash and nonspecific skin eruption 04/16/2016  . Colitis 04/08/2016  . Tremor 03/19/2016  . Left shoulder pain 02/18/2016  . Abdominal distension 02/18/2016  . Absolute anemia 01/29/2016  . Chronic pain 01/29/2016  . Neurogenic pain 01/29/2016  . Musculoskeletal pain 01/29/2016  . Long term current use of opiate analgesic 01/29/2016  . Long term prescription opiate use 01/29/2016  . Opiate use (15 MME/Day) 01/29/2016  . Encounter for therapeutic drug level monitoring 01/29/2016  . Encounter for pain management planning 01/29/2016  . Chronic neck pain (midline) 01/29/2016  . Chronic chest wall pain 01/29/2016  . Chronic low back pain (Location of Secondary source of pain) (Bilateral) (R>L) 01/29/2016  . Disturbance of skin sensation 01/29/2016  . Chronic shoulder pain (Location of Primary Source of Pain) (Left) 01/29/2016  . Chronic hip pain (Location of Tertiary source of pain) (Bilateral) (R>L) 01/29/2016  . Osteoarthritis of  hip (Bilateral) 01/29/2016  . Lumbar facet arthropathy (Severe at L2-3, L3-4, L4-5, and L5-S1) 01/29/2016  . Lumbar central spinal stenosis (moderate at L4-5; mild at L2-3 and L3-4) 01/29/2016  . Lumbar foraminal stenosis (L4-5) (Right) 01/29/2016  . Lumbar facet syndrome 01/29/2016  . Hx of cervical spine surgery 01/29/2016  . Numbness 11/19/2015  . Right wrist pain 11/09/2015  . Breast pain 10/19/2015  . Chest pain 10/14/2015  . Headache 10/14/2015  . Abdominal pain 10/14/2015  . Depression 10/14/2015  . Lip swelling 10/14/2015  . Pulmonary embolism ([place]) 08/15/2015  . Bilateral pneumonia 08/15/2015  . Nausea 08/15/2015  . Shortness of breath 07/31/2015  . Pleural effusion, bilateral 07/31/2015  . Cough 07/31/2015  . Chronic pain of multiple sites 06/05/2015  . Chronic lumbosacral pain 06/05/2015  . Esophageal spasm 06/05/2015  . History of DVT (deep vein thrombosis) 02/25/2015  . 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 Constipation 12/12/2014  . Coronary vasospasm ([place]) 12/09/2014  . History of pulmonary embolus (PE) 09/19/2014  . Impingement syndrome of shoulder 08/30/2014  . Impingement syndrome of left shoulder 08/30/2014  . Injury of superior glenoid labrum of shoulder joint 08/16/2014  . Supraspinatus tenosynovitis 08/16/2014  . Cervical spondylosis without myelopathy 08/16/2014  . Anxiety and depression 04/13/2014  . Osteoarthritis of hip (Left) 04/11/2014  . S/P THR (total hip replacement) (Left) 04/11/2014  . IBS (irritable bowel syndrome) 04/11/2014  . GERD (gastroesophageal reflux disease) 04/11/2014  . Insomnia 04/11/2014  . Essential hypertension, benign 10/28/2013  . Left hip pain 10/28/2013  . Anemia 10/28/2013  . HLD (hyperlipidemia) 10/28/2013    Orientation RESPIRATION BLADDER Height & Weight     Self, Time, Situation, Place  Normal Continent Weight:  190 lb (86.2 kg) Height:  5\' 6"  (167.6 cm)  BEHAVIORAL SYMPTOMS/MOOD NEUROLOGICAL BOWEL NUTRITION STATUS    (n/a)  (n/a) Continent  (n/a)  AMBULATORY STATUS COMMUNICATION OF NEEDS Skin   Independent (Independent with assitance of walker) Verbally Normal                       Personal Care Assistance Level of Assistance   (n/a)           Functional Limitations Info   (n/a)          SPECIAL CARE FACTORS FREQUENCY   (n/a)                    Contractures Contractures Info: Not present    Additional Factors Info   (n/a)               Current Medications (09/04/2016):  This is the current hospital active medication list Current Facility-Administered Medications  Medication Dose Route Frequency Provider Last Rate Last Dose  . acetaminophen (TYLENOL) tablet 650 mg  650 mg Oral Q6H PRN Gonzella Lex, MD   650 mg at 08/29/16 1152  . albuterol (PROVENTIL HFA;VENTOLIN HFA) 108 (90 Base) MCG/ACT inhaler 2 puff  2 puff Inhalation Q4H PRN Gonzella Lex, MD   2 puff at 08/29/16 0043  . alum & mag hydroxide-simeth (MAALOX/MYLANTA) 200-200-20 MG/5ML suspension 30 mL  30 mL Oral Q4H PRN Gonzella Lex, MD      . apixaban (ELIQUIS) tablet 5 mg  5 mg Oral BID Gonzella Lex, MD   5 mg at 09/04/16 0836  . carvedilol (COREG) tablet 3.125 mg  3.125 mg Oral BID WC Gonzella Lex, MD   3.125 mg at 09/04/16 0835  . cyclobenzaprine (FLEXERIL) tablet 10 mg  10 mg Oral Q6H PRN Gonzella Lex, MD   10 mg at 09/03/16 0824  . dicyclomine (BENTYL) tablet 20 mg  20 mg Oral TID WC PRN Gonzella Lex, MD   20 mg at 09/03/16 [id number]  . diltiazem (CARDIZEM SR) 12 hr capsule 60 mg  60 mg Oral Q12H Gonzella Lex, MD   60 mg at 09/04/16 0837  . escitalopram (LEXAPRO) tablet 20 mg  20 mg Oral Daily Gonzella Lex, MD   20 mg at 09/04/16 0835  . fluticasone (FLONASE) 50 MCG/ACT nasal spray 2 spray  2 spray Each Nare Daily Gonzella Lex, MD   2 spray at 09/03/16 0815  . furosemide (LASIX) tablet 20 mg  20 mg Oral Daily Gonzella Lex, MD   20 mg at 09/04/16 0835  . gabapentin (NEURONTIN) tablet 300 mg  300 mg  Oral TID Gonzella Lex, MD   300 mg at 09/04/16 1200  . guaiFENesin (ROBITUSSIN) 100 MG/5ML solution 300 mg  15 mL Oral Q6H PRN Gonzella Lex, MD   300 mg at 09/01/16 2151  . isosorbide mononitrate (IMDUR) 24 hr tablet 30 mg  30 mg Oral Daily Gonzella Lex, MD   30 mg at 09/04/16 0837  . magnesium hydroxide (MILK OF MAGNESIA) suspension 30 mL  30 mL Oral Daily PRN Gonzella Lex, MD      . OLANZapine (ZYPREXA) tablet 10 mg  10 mg Oral QHS Jolanta B Pucilowska, MD   10 mg at 09/03/16 2200  . oxyCODONE-acetaminophen (PERCOCET/ROXICET) 5-325 MG per tablet 1 tablet  1 tablet Oral Q6H PRN Gonzella Lex, MD   1 tablet  at 09/04/16 0026  . oxymetazoline (AFRIN) 0.05 % nasal spray 1 spray  1 spray Each Nare BID Gonzella Lex, MD   1 spray at 09/04/16 [phone]  . pantoprazole (PROTONIX) EC tablet 40 mg  40 mg Oral Daily Gonzella Lex, MD   40 mg at 09/04/16 0835  . potassium chloride SA (K-DUR,KLOR-CON) CR tablet 10 mEq  10 mEq Oral Daily Gonzella Lex, MD   10 mEq at 09/04/16 0835  . traZODone (DESYREL) tablet 150 mg  150 mg Oral QHS Clovis Fredrickson, MD   150 mg at 09/03/16 2200     Discharge Medications: Please see discharge summary for a list of discharge medications.  Relevant Imaging Results:  Relevant Lab Results:   Additional Information n/a  Jolaine Click, LCSWA

## 2016-09-04 NOTE — [facility] Group Notes (Signed)
Goals Group Date/Time: 09/04/2016 9:00 AM Type of Therapy and Topic: Group Therapy: Goals Group: SMART Goals   Participation Level: Moderate  Description of Group:    The purpose of a daily goals group is to assist and guide patients in setting recovery/wellness-related goals. The objective is to set goals as they relate to the crisis in which they were admitted. Patients will be using SMART goal modalities to set measurable goals. Characteristics of realistic goals will be discussed and patients will be assisted in setting and processing how one will reach their goal. Facilitator will also assist patients in applying interventions and coping skills learned in psycho-education groups to the SMART goal and process how one will achieve defined goal.   Therapeutic Goals:   -Patients will develop and document one goal related to or their crisis in which brought them into treatment.  -Patients will be guided by LCSW using SMART goal setting modality in how to set a measurable, attainable, realistic and time sensitive goal.  -Patients will process barriers in reaching goal.  -Patients will process interventions in how to overcome and successful in reaching goal.   Patient's Goal: Pt reported the pt's goal was to "get better and become focused on my goals".  Pt reported the pt intended the plan for the future by seeking help from others after discharge.   Therapeutic Modalities:  Motivational Interviewing  Art gallery manager  SMART goals setting   Alphonse Guild. Leshea Jaggers, LCSWA, LCAS

## 2016-09-04 NOTE — Progress Notes (Signed)
D: Pt denies SI/HI/AVH. Pt is pleasant and cooperative, affect is flat but brightens upon approach. Pt appears less anxious s and she is interacting with peers and staff appropriately.  A: Pt was offered support and encouragement. Pt was given scheduled medications. Pt was encouraged to attend groups. Q 15 minute checks were done for safety.  R:Pt attends groups and interacts well with peers and staff. Pt is taking medication. Pt has no complaints.Pt receptive to treatment and safety maintained on unit.

## 2016-09-04 NOTE — Tx Team (Signed)
Interdisciplinary Treatment and Diagnostic Plan Update  09/04/2016 Time of Session: 10:30am Valerie Vazquez MRN: [id number]  Principal Diagnosis: Schizoaffective disorder, depressive type [facility])  Secondary Diagnoses: Principal Problem:   Schizoaffective disorder, depressive type ([place]) Active Problems:   Essential hypertension, benign   IBS (irritable bowel syndrome)   History of DVT (deep vein thrombosis)   Chronic pain   Musculoskeletal pain   Current Medications:  Current Facility-Administered Medications  Medication Dose Route Frequency Provider Last Rate Last Dose  . acetaminophen (TYLENOL) tablet 650 mg  650 mg Oral Q6H PRN Gonzella Lex, MD   650 mg at 08/29/16 1152  . albuterol (PROVENTIL HFA;VENTOLIN HFA) 108 (90 Base) MCG/ACT inhaler 2 puff  2 puff Inhalation Q4H PRN Gonzella Lex, MD   2 puff at 08/29/16 0043  . alum & mag hydroxide-simeth (MAALOX/MYLANTA) 200-200-20 MG/5ML suspension 30 mL  30 mL Oral Q4H PRN Gonzella Lex, MD      . apixaban (ELIQUIS) tablet 5 mg  5 mg Oral BID Gonzella Lex, MD   5 mg at 09/04/16 0836  . carvedilol (COREG) tablet 3.125 mg  3.125 mg Oral BID WC Gonzella Lex, MD   3.125 mg at 09/04/16 0835  . cyclobenzaprine (FLEXERIL) tablet 10 mg  10 mg Oral Q6H PRN Gonzella Lex, MD   10 mg at 09/03/16 0824  . dicyclomine (BENTYL) tablet 20 mg  20 mg Oral TID WC PRN Gonzella Lex, MD   20 mg at 09/03/16 [id number]  . diltiazem (CARDIZEM SR) 12 hr capsule 60 mg  60 mg Oral Q12H Gonzella Lex, MD   60 mg at 09/04/16 0837  . escitalopram (LEXAPRO) tablet 20 mg  20 mg Oral Daily Gonzella Lex, MD   20 mg at 09/04/16 0835  . fluticasone (FLONASE) 50 MCG/ACT nasal spray 2 spray  2 spray Each Nare Daily Gonzella Lex, MD   2 spray at 09/03/16 0815  . furosemide (LASIX) tablet 20 mg  20 mg Oral Daily Gonzella Lex, MD   20 mg at 09/04/16 0835  . gabapentin (NEURONTIN) tablet 300 mg  300 mg Oral TID Gonzella Lex, MD   300 mg at 09/04/16 0835  .  guaiFENesin (ROBITUSSIN) 100 MG/5ML solution 300 mg  15 mL Oral Q6H PRN Gonzella Lex, MD   300 mg at 09/01/16 2151  . isosorbide mononitrate (IMDUR) 24 hr tablet 30 mg  30 mg Oral Daily Gonzella Lex, MD   30 mg at 09/04/16 0837  . magnesium hydroxide (MILK OF MAGNESIA) suspension 30 mL  30 mL Oral Daily PRN Gonzella Lex, MD      . OLANZapine (ZYPREXA) tablet 10 mg  10 mg Oral QHS Jolanta B Pucilowska, MD   10 mg at 09/03/16 2200  . oxyCODONE-acetaminophen (PERCOCET/ROXICET) 5-325 MG per tablet 1 tablet  1 tablet Oral Q6H PRN Gonzella Lex, MD   1 tablet at 09/04/16 0026  . oxymetazoline (AFRIN) 0.05 % nasal spray 1 spray  1 spray Each Nare BID Gonzella Lex, MD   1 spray at 09/04/16 [phone]  . pantoprazole (PROTONIX) EC tablet 40 mg  40 mg Oral Daily Gonzella Lex, MD   40 mg at 09/04/16 0835  . potassium chloride SA (K-DUR,KLOR-CON) CR tablet 10 mEq  10 mEq Oral Daily Gonzella Lex, MD   10 mEq at 09/04/16 0835  . traZODone (DESYREL) tablet 150 mg  150 mg Oral QHS Jolanta B  Pucilowska, MD   150 mg at 09/03/16 2200   PTA Medications: Prescriptions Prior to Admission  Medication Sig Dispense Refill Last Dose  . benzonatate (TESSALON) 100 MG capsule Take 100 mg by mouth 2 (two) times daily as needed for cough.   unknown at unknown  . calcium carbonate (TUMS - DOSED IN MG ELEMENTAL CALCIUM) 500 MG chewable tablet Chew 1 tablet by mouth daily as needed for indigestion or heartburn.   prn at prn  . dicyclomine (BENTYL) 10 MG capsule TAKE 2 CAPSULES BY MOUTH EVERY SIX HOURS (Patient taking differently: TAKE 1 CAPSULES BY MOUTH THREE TIMES DAILY AS NEEDED FOR SPASMS.) 240 capsule 5 unknown at unknown  . diltiazem (CARDIZEM SR) 60 MG 12 hr capsule TAKE 1 CAPSULE (60 MG TOTAL) BY MOUTH EVERY 6 (SIX) HOURS AS NEEDED. 120 capsule 3 unknown at unknown  . diphenhydrAMINE (BENADRYL) 25 MG tablet Take 1-2 tablets (25-50 mg total) by mouth every 6 (six) hours as needed for itching ([place]). 30 tablet 0 prn at  prn  . escitalopram (LEXAPRO) 10 MG tablet Take 1 tablet (10 mg total) by mouth daily. 30 tablet 0 unknown at unknown  . furosemide (LASIX) 20 MG tablet TAKE 1 TABLET BY MOUTH EVERY DAY (Patient taking differently: TAKE 1 TABLET BY MOUTH EVERY DAY prn) 90 tablet 2 prn at prn  . gabapentin (NEURONTIN) 600 MG tablet Take 0.5 tablets (300 mg total) by mouth every 8 (eight) hours. 30 tablet 0 unknown at unknown  . Multiple Vitamin (MULTIVITAMIN WITH MINERALS) TABS tablet Take 2 tablets by mouth daily.    unknown at unknown  . mupirocin ointment (BACTROBAN) 2 % Place 1 application into the nose 3 (three) times daily. 22 g 0 prn at prn  . ondansetron (ZOFRAN) 4 MG tablet Take 1 tablet (4 mg total) by mouth every 8 (eight) hours as needed for nausea or vomiting. 30 tablet 0 prn at prn  . pantoprazole (PROTONIX) 40 MG tablet TAKE ONE TABLET BY MOUTH EVERY DAY 30 tablet 5 unknown at unknown  . potassium chloride (K-DUR) 10 MEQ tablet Take 1 tablet (10 mEq total) by mouth 2 (two) times daily. (Patient taking differently: Take 10 mEq by mouth 2 (two) times daily as needed. ) 60 tablet 11 prn at prn  . traZODone (DESYREL) 50 MG tablet Take 1 tablet (50 mg total) by mouth at bedtime. (Patient taking differently: Take 50 mg by mouth at bedtime as needed for sleep. ) 30 tablet 5 prn at prn  . triamcinolone (NASACORT AQ) 55 MCG/ACT AERO nasal inhaler Place 2 sprays into the nose daily. (Patient not taking: Reported on 08/28/2016) 1 Inhaler 12 Not Taking at Unknown time  . [DISCONTINUED] albuterol (PROVENTIL HFA;VENTOLIN HFA) 108 (90 Base) MCG/ACT inhaler Inhale 2 puffs into the lungs every 6 (six) hours as needed for wheezing or shortness of breath. 1 Inhaler 2 prn at prn  . [DISCONTINUED] apixaban (ELIQUIS) 5 MG TABS tablet Take 1 tablet (5 mg total) by mouth 2 (two) times daily. 30 tablet 0 unknown at unknown  . [DISCONTINUED] carvedilol (COREG) 3.125 MG tablet Take 1 tablet (3.125 mg total) by mouth 2 (two) times  daily with a meal. 30 tablet 0 unknown at unknown  . [DISCONTINUED] dicyclomine (BENTYL) 20 MG tablet Take 1 tablet (20 mg total) by mouth 3 (three) times daily as needed for spasms. (Patient not taking: Reported on 08/28/2016) 30 tablet 0 Not Taking at Unknown time  . [DISCONTINUED] EPINEPHrine 0.3 mg/0.3 mL IJ  SOAJ injection Inject 0.3 mLs (0.3 mg total) into the muscle once. 1 Device 0 prn at prn  . [DISCONTINUED] isosorbide mononitrate (IMDUR) 30 MG 24 hr tablet Take 1 tablet (30 mg total) by mouth daily. 30 tablet 0 unknown at unknown  . [DISCONTINUED] oxyCODONE-acetaminophen (PERCOCET/ROXICET) 5-325 MG tablet Take 1 tablet by mouth 2 (two) times daily as needed for severe pain. (Patient not taking: Reported on 08/28/2016) 30 tablet 0 Completed Course at Unknown time    Patient Stressors: Health problems Medication change or noncompliance  Patient Strengths: Average or above average intelligence Capable of independent living Communication skills  Treatment Modalities: Medication Management, Group therapy, Case management,  1 to 1 session with clinician, Psychoeducation, Recreational therapy.   Physician Treatment Plan for Primary Diagnosis: Schizoaffective disorder, depressive type ([place]) Long Term Goal(s): Improvement in symptoms so as ready for discharge Improvement in symptoms so as ready for discharge   Short Term Goals: Ability to verbalize feelings will improve Ability to disclose and discuss suicidal ideas Ability to identify and develop effective coping behaviors will improve Ability to maintain clinical measurements within normal limits will improve  Medication Management: Evaluate patient's response, side effects, and tolerance of medication regimen.  Therapeutic Interventions: 1 to 1 sessions, Unit Group sessions and Medication administration.  Evaluation of Outcomes: Adequate for Discharge  Physician Treatment Plan for Secondary Diagnosis: Principal Problem:    Schizoaffective disorder, depressive type ([place]) Active Problems:   Essential hypertension, benign   IBS (irritable bowel syndrome)   History of DVT (deep vein thrombosis)   Chronic pain   Musculoskeletal pain  Long Term Goal(s): Improvement in symptoms so as ready for discharge Improvement in symptoms so as ready for discharge   Short Term Goals: Ability to verbalize feelings will improve Ability to disclose and discuss suicidal ideas Ability to identify and develop effective coping behaviors will improve Ability to maintain clinical measurements within normal limits will improve     Medication Management: Evaluate patient's response, side effects, and tolerance of medication regimen.  Therapeutic Interventions: 1 to 1 sessions, Unit Group sessions and Medication administration.  Evaluation of Outcomes: Adequate for Discharge   RN Treatment Plan for Primary Diagnosis: Schizoaffective disorder, depressive type ([place]) Long Term Goal(s): Knowledge of disease and therapeutic regimen to maintain health will improve  Short Term Goals: Ability to remain free from injury will improve, Ability to demonstrate self-control, Ability to disclose and discuss suicidal ideas, Ability to identify and develop effective coping behaviors will improve and Compliance with prescribed medications will improve  Medication Management: RN will administer medications as ordered by provider, will assess and evaluate patient's response and provide education to patient for prescribed medication. RN will report any adverse and/or side effects to prescribing provider.  Therapeutic Interventions: 1 on 1 counseling sessions, Psychoeducation, Medication administration, Evaluate responses to treatment, Monitor vital signs and CBGs as ordered, Perform/monitor CIWA, COWS, AIMS and Fall Risk screenings as ordered, Perform wound care treatments as ordered.  Evaluation of Outcomes: Adequate for Discharge   LCSW Treatment  Plan for Primary Diagnosis: Schizoaffective disorder, depressive type ([place]) Long Term Goal(s): Safe transition to appropriate next level of care at discharge, Engage patient in therapeutic group addressing interpersonal concerns.  Short Term Goals: Engage patient in aftercare planning with referrals and resources, Increase social support, Increase ability to appropriately verbalize feelings, Facilitate patient progression through stages of change regarding substance use diagnoses and concerns and Increase skills for wellness and recovery  Therapeutic Interventions: Assess for all discharge needs,  1 to 1 time with Education officer, museum, Explore available resources and support systems, Assess for adequacy in community support network, Educate family and significant other(s) on suicide prevention, Complete Psychosocial Assessment, Interpersonal group therapy.  Evaluation of Outcomes: Adequate for Discharge   Progress in Treatment: Attending groups: Yes. Participating in groups: Yes. Taking medication as prescribed: Yes. Toleration medication: Yes. Family/Significant other contact made: No, Patient refused for family contact to be made. SPE completed with patient.  Patient understands diagnosis: Yes. Discussing patient identified problems/goals with staff: Yes. Medical problems stabilized or resolved: Yes. Denies suicidal/homicidal ideation: Yes. Issues/concerns per patient self-inventory: No. Other: n/a  New problem(s) identified: None identified at this time.   New Short Term/Long Term Goal(s): None identified at this time.   Discharge Plan or Barriers: Patient will discharge to [place] [facility] in [place] and follow-up with [facility] for outpatient services including medication management and outpatient therapy.   Reason for Continuation of Hospitalization: Anticipated discharge 09/04/2016.  Estimated Length of Stay: Anticipated discharge  09/04/2016.  Attendees: Patient: Valerie Vazquez 09/04/2016 11:34 AM  Physician: Dr. Bary Leriche, MD 09/04/2016 11:34 AM  Nursing: Elige Radon, RN 09/04/2016 11:34 AM  RN Care Manager: 09/04/2016 11:34 AM  Social Worker: Jen Mow. Satira Sark 09/04/2016 11:34 AM  Recreational Therapist: Leonette Monarch, LRT/CTRS 09/04/2016 11:34 AM  Other:  09/04/2016 11:34 AM  Other:  09/04/2016 11:34 AM  Other: 09/04/2016 11:34 AM    Scribe for Treatment Team: Jolaine Click, LCSWA 09/04/2016 1:09 PM

## 2016-09-04 NOTE — Progress Notes (Signed)
Patient discharged home. DC instructions provided and explained. Medications reviewed, Rx given, all questions answered. Pt stable at discharge. Denies SI, HI, AVH. Belongings returned, transition and suicide risk assessment given to patient.

## 2016-09-04 NOTE — [facility] Suicide Risk Assessment (Signed)
[facility] Discharge Suicide Risk Assessment   Principal Problem: Schizoaffective disorder, depressive type [facility]) Discharge Diagnoses:  Patient Active Problem List   Diagnosis Date Noted  . Schizoaffective disorder, depressive type ([place]) [F25.1] 08/28/2016  . Sinusitis, acute maxillary [J01.00] 08/27/2016  . Suicide attempt [T14.91XA] 08/27/2016  . Rash and nonspecific skin eruption [R21] 04/16/2016  . Colitis [K52.9] 04/08/2016  . Tremor [R25.1] 03/19/2016  . Left shoulder pain [M25.512] 02/18/2016  . Abdominal distension [R14.0] 02/18/2016  . Absolute anemia [D64.9] 01/29/2016  . Chronic pain [G89.29] 01/29/2016  . Neurogenic pain [M79.2] 01/29/2016  . Musculoskeletal pain [M79.1] 01/29/2016  . Long term current use of opiate analgesic [Z79.891] 01/29/2016  . Long term prescription opiate use [Z79.891] 01/29/2016  . Opiate use (15 MME/Day) [F11.90] 01/29/2016  . Encounter for therapeutic drug level monitoring [Z51.81] 01/29/2016  . Encounter for pain management planning [Z01.89] 01/29/2016  . Chronic neck pain (midline) [M54.2, G89.29] 01/29/2016  . Chronic chest wall pain [R07.89, G89.29] 01/29/2016  . Chronic low back pain (Location of Secondary source of pain) (Bilateral) (R>L) [M54.5, G89.29] 01/29/2016  . Disturbance of skin sensation [R20.9] 01/29/2016  . Chronic shoulder pain (Location of Primary Source of Pain) (Left) [M25.512, G89.29] 01/29/2016  . Chronic hip pain (Location of Tertiary source of pain) (Bilateral) (R>L) [M25.559, G89.29] 01/29/2016  . Osteoarthritis of hip (Bilateral) [M16.0] 01/29/2016  . Lumbar facet arthropathy (Severe at L2-3, L3-4, L4-5, and L5-S1) [M12.88] 01/29/2016  . Lumbar central spinal stenosis (moderate at L4-5; mild at L2-3 and L3-4) [M48.061] 01/29/2016  . Lumbar foraminal stenosis (L4-5) (Right) [M99.83] 01/29/2016  . Lumbar facet syndrome [M12.88] 01/29/2016  . Hx of cervical spine surgery [Z98.890] 01/29/2016  . Numbness [R20.0] 11/19/2015   . Right wrist pain [M25.531] 11/09/2015  . Breast pain [N64.4] 10/19/2015  . Chest pain [R07.9] 10/14/2015  . Headache [R51] 10/14/2015  . Abdominal pain [R10.9] 10/14/2015  . Depression [F32.9] 10/14/2015  . Lip swelling [R22.0] 10/14/2015  . Pulmonary embolism ([place]) [I26.99] 08/15/2015  . Bilateral pneumonia [J18.9] 08/15/2015  . Nausea [R11.0] 08/15/2015  . Shortness of breath [R06.02] 07/31/2015  . Pleural effusion, bilateral [J90] 07/31/2015  . Cough [R05] 07/31/2015  . Chronic pain of multiple sites [R52, G89.29] 06/05/2015  . Chronic lumbosacral pain [M54.5, G89.29] 06/05/2015  . Esophageal spasm [K22.4] 06/05/2015  . History of DVT (deep vein thrombosis) [Z86.718] 02/25/2015  . Hypercementosis [K03.4] 02/13/2015  . Constipation [K59.00] 12/12/2014  . Coronary vasospasm ([place]) [I20.1] 12/09/2014  . History of pulmonary embolus (PE) [Z86.711] 09/19/2014  . Impingement syndrome of shoulder [M75.40] 08/30/2014  . Impingement syndrome of left shoulder [M75.42] 08/30/2014  . Injury of superior glenoid labrum of shoulder joint [S43.439A] 08/16/2014  . Supraspinatus tenosynovitis [M65.819] 08/16/2014  . Cervical spondylosis without myelopathy [M47.812] 08/16/2014  . Anxiety and depression [F41.8] 04/13/2014  . Osteoarthritis of hip (Left) [M16.12] 04/11/2014  . S/P THR (total hip replacement) (Left) [id number] 04/11/2014  . IBS (irritable bowel syndrome) [K58.9] 04/11/2014  . GERD (gastroesophageal reflux disease) [K21.9] 04/11/2014  . Insomnia [G47.00] 04/11/2014  . Essential hypertension, benign [I10] 10/28/2013  . Left hip pain [M25.552] 10/28/2013  . Anemia [D64.9] 10/28/2013  . HLD (hyperlipidemia) [E78.5] 10/28/2013    Total Time spent with patient: 30 minutes  Musculoskeletal: Strength & Muscle Tone: within normal limits Gait & Station: normal Patient leans: N/A  Psychiatric Specialty Exam: Review of Systems  Musculoskeletal: Positive for back pain and joint  pain.  All other systems reviewed and are negative.   Blood  pressure 129/80, pulse 78, temperature 98.2 F (36.8 C), resp. rate 18, height 5\' 6"  (1.676 m), weight 86.2 kg (190 lb), SpO2 100 %.Body mass index is 30.67 kg/m.  General Appearance: Casual  Eye Contact::  Good  Speech:  Clear and Coherent409  Volume:  Normal  Mood:  Euthymic  Affect:  Appropriate  Thought Process:  Goal Directed and Descriptions of Associations: Intact  Orientation:  Full (Time, Place, and Person)  Thought Content:  WDL  Suicidal Thoughts:  No  Homicidal Thoughts:  No  Memory:  Immediate;   Fair Recent;   Fair Remote;   Fair  Judgement:  Impaired  Insight:  Present  Psychomotor Activity:  Normal  Concentration:  Fair  Recall:  [employer] of Knowledge:Fair  Language: Fair  Akathisia:  No  Handed:  Right  AIMS (if indicated):     Assets:  Communication Skills Desire for Improvement Financial Resources/Insurance Resilience  Sleep:  Number of Hours: 5.5  Cognition: WNL  ADL's:  Intact   Mental Status Per Nursing Assessment::   On Admission:     Demographic Factors:  Divorced or widowed and Low socioeconomic status  Loss Factors: NA  Historical Factors: Prior suicide attempts, Family history of mental illness or substance abuse and Impulsivity  Risk Reduction Factors:   Sense of responsibility to family and Positive therapeutic relationship  Continued Clinical Symptoms:  Bipolar Disorder:   Depressive phase  Cognitive Features That Contribute To Risk:  None    Suicide Risk:  Minimal: No identifiable suicidal ideation.  Patients presenting with no risk factors but with morbid ruminations; may be classified as minimal risk based on the severity of the depressive symptoms  Follow-up Information    [facility] Follow up.   Specialty:  Behavioral Health Why:  Please arrive to the walk-in clinic Monday through Friday from 9-4pm for your assessment and hospital follow up for medication  management and therapy. Contact information: [place] [address] [phone]        [facility] [place] Follow up.   Why:  Please arrive between the hours of 10am-3pm on Thursday January 4th to be admitted for long-term residential care and for assessment for eventual transition to independent living. Contact information: [facility] [place] [facility] Attn: Kendra Opitz  [address] [place], [place] [address] Ph: [phone] Fax not required, or needed. Do not fax          Plan Of Care/Follow-up recommendations:  Activity:  as tolerated. Diet:  low sodium heart healthy. Other:  keep follow up appointments.  Orson Slick, MD 09/04/2016, 11:22 AM

## 2016-09-04 NOTE — [facility] Group Notes (Signed)
[place] Group Notes:  (Nursing/MHT/Case Management/Adjunct)  Date:  09/04/2016  Time:  3:26 PM  Type of Therapy:  Group Therapy  Participation Level:  Did Not Attend  Hiliana Eilts De'Chelle Christin [place] 09/04/2016, 3:26 PM

## 2016-09-04 NOTE — Progress Notes (Signed)
Recreation Therapy Notes  Date: 01.04.18 Time: 1:00 pm Location: Craft Room  Group Topic: Leisure Education  Goal Area(s) Addresses:  Patient will identify things they are grateful for. Patient will identify how being grateful can influence decision making.  Behavioral Response: Did not attend  Intervention: Grateful Wheel  Activity: Patients were given an I Am Grateful For worksheet and were instructed to write things they are grateful for under each category.  Education: LRT educated patients on why it is important to be grateful.  Education Outcome: Patient did not attend group.   Clinical Observations/Feedback: Patient did not attend group.  Leonette Monarch, LRT/CTRS 09/04/2016 2:08 PM
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
## 2016-09-04 NOTE — [facility] Group Notes (Signed)
[place] LCSW Group Therapy   09/04/2016 1pm   Type of Therapy: Group Therapy   Participation Level: Active   Participation Quality: Attentive, Sharing and Supportive   Affect: Appropriate   Cognitive: Alert and Oriented   Insight: Developing/Improving and Engaged   Engagement in Therapy: Developing/Improving and Engaged   Modes of Intervention: Clarification, Confrontation, Discussion, Education, Exploration, Limit-setting, Orientation, Problem-solving, Rapport Building, Art therapist, Socialization and Support   Summary of Progress/Problems: The topic for group was balance in life. Today's group focused on defining balance in one's own words, identifying things that can knock one off balance, and exploring healthy ways to maintain balance in life. Group members were asked to provide an example of a time when they felt off balance, describe how they handled that situation, and process healthier ways to regain balance in the future. Group members were asked to share the most important tool for maintaining balance that they learned while at [facility] and how they plan to apply this method after discharge. Pt reports the pt reaches out to others and in order to achieve balance. Once the pt felt out of balance due to feeling isolated and came to the hospital and asked to be connected with resources in order to feel balanced again. Pt was polite and cooperative with the CSW and other group members and focused and attentive to the topics discussed and the sharing of others.  Alphonse Guild. Korin Hartwell, LCSWA, LCAS

## 2016-09-04 NOTE — Discharge Summary (Deleted)
Physician Discharge Summary Note  Patient:  Valerie Vazquez is an 61 y.o., female MRN:  [id number] DOB:  08/03/1956 Patient phone:  [phone] (home)  Patient address:   [place] [address],  Total Time spent with patient: 30 minutes  Date of Admission:  08/28/2016 Date of Discharge: 09/04/2016  Reason for Admission:  Suicidal ideation.  History of Present Illness: 61 year old woman presented to the emergency room referred by her primary care doctor. Patient reports severe depression that has been going on since the summer of this year. Mood feels sad and depressed all the time. Feels increasingly hopeless. Has no feeling of anything she enjoys in her life. Fatigued all the time. Physically feels sick a lot of the time. She says she is having hallucinations which are chronic but have been worse recently. Says that she has been compliant with her psychiatric medicine which currently just is Lexapro. Hasn't been helping. Major stress came from the death of her significant other this summer. More acutely she is being thrown out of the home she has been living in.  Associated Signs/Symptoms: Depression Symptoms:  depressed mood, anhedonia, psychomotor retardation, fatigue, feelings of worthlessness/guilt, hopelessness, suicidal thoughts with specific plan, (Hypo) Manic Symptoms:  None Anxiety Symptoms:  Excessive Worry, Psychotic Symptoms:  Hallucinations: Auditory PTSD Symptoms: Negative  Past Psychiatric History: patient has a history of a diagnosis of schizoaffective disorder. Last psychiatric hospitalization she recalls was in 2014. That was in [place]. She says she has tried to kill her self more than once in the past. Current medication has only been Lexapro. She says she has been on multiple antipsychotics in the past as well as antidepressants. She has several allergies listed. She cannot recall any medicine that she definitely thinks was helpful in the past.  Prior  Inpatient Therapy:  no records in our computer but she says she has had hospitalizations the last one was in [place]. Prior Outpatient Therapy:  currently she says she is just getting treatment from her primary care doctor.  Family Psychiatric  History: 2 sons both have psychotic disorders   Social History:patient has been living with some friends. She says they're putting her out of the house and she has no place to live. Has no family she trusts or gets along with. Had been trying she says to move to [place] a while ago for reasons that are somewhat vague.   Principal Problem: Schizoaffective disorder, depressive type [facility]) Discharge Diagnoses: Patient Active Problem List   Diagnosis Date Noted  . Schizoaffective disorder, depressive type ([place]) [F25.1] 08/28/2016  . Sinusitis, acute maxillary [J01.00] 08/27/2016  . Suicide attempt [T14.91XA] 08/27/2016  . Rash and nonspecific skin eruption [R21] 04/16/2016  . Colitis [K52.9] 04/08/2016  . Tremor [R25.1] 03/19/2016  . Left shoulder pain [M25.512] 02/18/2016  . Abdominal distension [R14.0] 02/18/2016  . Absolute anemia [D64.9] 01/29/2016  . Chronic pain [G89.29] 01/29/2016  . Neurogenic pain [M79.2] 01/29/2016  . Musculoskeletal pain [M79.1] 01/29/2016  . Long term current use of opiate analgesic [Z79.891] 01/29/2016  . Long term prescription opiate use [Z79.891] 01/29/2016  . Opiate use (15 MME/Day) [F11.90] 01/29/2016  . Encounter for therapeutic drug level monitoring [Z51.81] 01/29/2016  . Encounter for pain management planning [Z01.89] 01/29/2016  . Chronic neck pain (midline) [M54.2, G89.29] 01/29/2016  . Chronic chest wall pain [R07.89, G89.29] 01/29/2016  . Chronic low back pain (Location of Secondary source of pain) (Bilateral) (R>L) [M54.5, G89.29] 01/29/2016  . Disturbance of skin sensation [R20.9] 01/29/2016  .  Chronic shoulder pain (Location of Primary Source of Pain) (Left) [M25.512, G89.29] 01/29/2016  . Chronic hip  pain (Location of Tertiary source of pain) (Bilateral) (R>L) [M25.559, G89.29] 01/29/2016  . Osteoarthritis of hip (Bilateral) [M16.0] 01/29/2016  . Lumbar facet arthropathy (Severe at L2-3, L3-4, L4-5, and L5-S1) [M12.88] 01/29/2016  . Lumbar central spinal stenosis (moderate at L4-5; mild at L2-3 and L3-4) [M48.061] 01/29/2016  . Lumbar foraminal stenosis (L4-5) (Right) [M99.83] 01/29/2016  . Lumbar facet syndrome [M12.88] 01/29/2016  . Hx of cervical spine surgery [Z98.890] 01/29/2016  . Numbness [R20.0] 11/19/2015  . Right wrist pain [M25.531] 11/09/2015  . Breast pain [N64.4] 10/19/2015  . Chest pain [R07.9] 10/14/2015  . Headache [R51] 10/14/2015  . Abdominal pain [R10.9] 10/14/2015  . Depression [F32.9] 10/14/2015  . Lip swelling [R22.0] 10/14/2015  . Pulmonary embolism ([place]) [I26.99] 08/15/2015  . Bilateral pneumonia [J18.9] 08/15/2015  . Nausea [R11.0] 08/15/2015  . Shortness of breath [R06.02] 07/31/2015  . Pleural effusion, bilateral [J90] 07/31/2015  . Cough [R05] 07/31/2015  . Chronic pain of multiple sites [R52, G89.29] 06/05/2015  . Chronic lumbosacral pain [M54.5, G89.29] 06/05/2015  . Esophageal spasm [K22.4] 06/05/2015  . History of DVT (deep vein thrombosis) [Z86.718] 02/25/2015  . Hypercementosis [K03.4] 02/13/2015  . Constipation [K59.00] 12/12/2014  . Coronary vasospasm ([place]) [I20.1] 12/09/2014  . History of pulmonary embolus (PE) [Z86.711] 09/19/2014  . Impingement syndrome of shoulder [M75.40] 08/30/2014  . Impingement syndrome of left shoulder [M75.42] 08/30/2014  . Injury of superior glenoid labrum of shoulder joint [S43.439A] 08/16/2014  . Supraspinatus tenosynovitis [M65.819] 08/16/2014  . Cervical spondylosis without myelopathy [M47.812] 08/16/2014  . Anxiety and depression [F41.8] 04/13/2014  . Osteoarthritis of hip (Left) [M16.12] 04/11/2014  . S/P THR (total hip replacement) (Left) [id number] 04/11/2014  . IBS (irritable bowel syndrome) [K58.9]  04/11/2014  . GERD (gastroesophageal reflux disease) [K21.9] 04/11/2014  . Insomnia [G47.00] 04/11/2014  . Essential hypertension, benign [I10] 10/28/2013  . Left hip pain [M25.552] 10/28/2013  . Anemia [D64.9] 10/28/2013  . HLD (hyperlipidemia) [E78.5] 10/28/2013    Past Medical History:  Past Medical History:  Diagnosis Date  . Anemia   . Bilateral renal cysts   . Chronic back pain    "upper and lower" (09/19/2014)  . Depression    "major" (09/19/2014)  . DVT (deep venous thrombosis) ([place])   . Esophageal spasm   . Gallstones   . Gastritis   . Gastroenteritis   . GERD (gastroesophageal reflux disease)   . Headache    "couple /month lately" (09/19/2014)  . High blood pressure   . High cholesterol   . History of blood transfusion 1985   related to hysterectomy  . Hypertension   . IBS (irritable bowel syndrome)   . Migraine    "a few times/yr" (09/19/2014)  . Myocardial infarction 10/2010 X 3   "while hospitalized"  . Osteoarthritis    "qwhere; mostly around my joints" (09/19/2014)  . Pneumonia 04/2014  . PTSD (post-traumatic stress disorder)   . Pulmonary embolism ([place]) 04/2001; 09/19/2014   "after gallbladder OR; "  . Schizoaffective disorder ([place])   . Sleep apnea    "mild" (09/19/2014)  . Snoring    a. sleep study 5/16: No OSA  . Spinal stenosis   . Spondylosis   . Type II diabetes mellitus ([place])    "dx'd in 2007; lost weight; no RX for ~ 4 yr now" (09/19/2014)    Past Surgical History:  Procedure Laterality Date  . ABDOMINAL HYSTERECTOMY  [place] DECOMP/DISCECTOMY FUSION  Dec 17, 2012  . APPENDECTOMY  1985  . BACK SURGERY    . BREAST CYST EXCISION Right   . BREAST EXCISIONAL BIOPSY Right YRS AGO   NEG  . CARDIAC CATHETERIZATION   2000's; 12/18/2007  . CHOLECYSTECTOMY  12-17-00  . EXCISION/RELEASE BURSA HIP Right 12/1984  . HERNIA REPAIR  1985  . LEFT HEART CATHETERIZATION WITH CORONARY ANGIOGRAM N/A 12/09/2014   Procedure: LEFT HEART CATHETERIZATION WITH  CORONARY ANGIOGRAM;  Surgeon: Burnell Blanks, MD;  [facility] C[facility] LAB;  Service: Cardiovascular;  Laterality: N/A;  . TONSILLECTOMY AND ADENOIDECTOMY  ~ 12/18/66  . TOTAL HIP ARTHROPLASTY Left 04-06-2014  . UMBILICAL HERNIA REPAIR  1985  . VENA CAVA FILTER PLACEMENT  03/2014   Family History:  Family History  Problem Relation Age of Onset  . Stroke Mother     Deceased  . Lung cancer Father     Deceased  . Healthy Daughter   . Healthy Son     x 2  . Other Son     Suicide  . Heart attack Neg Hx    Social History:  History  Alcohol Use No     History  Drug Use No    Social History   Social History  . Marital status: Legally Separated    Spouse name: N/A  . Number of children: N/A  . Years of education: N/A   Social History Main Topics  . Smoking status: Former Smoker    Packs/day: 1.50    Years: 10.00    Types: Cigarettes    Quit date: 04/12/1979  . Smokeless tobacco: Never Used  . Alcohol use No  . Drug use: No  . Sexual activity: Not Currently   Other Topics Concern  . None   Social History Narrative   Lives with fiancee in an apartment on the first floor.  Has 3 grown children.  One child passed away.  On disability December 18, 1994 - 2000, 2007 - current.     Education: some college.    Hospital Course:    [name] is a 61 year old female with a history of schizoaffective disorder admitted here for worsening of depression and suicidal ideation.  1. Suicidal ideation. Resolved. The patient is able to contract for safety. She is forward thinking and more optimistic about the future.   2. Mood and psychosis. We gave Zyprexa for mood stabilization and Lexapro for depression.    3. COPD. She was on Albuterol. The patient completed Levaquin course for bronchitis.   4. Hypertension and coronary artery disease. She is on Coreg, Cardizem, Lasix, Imdur, and potassium.  5. Chronic pain. She is on Neurontin, oxycodone, and Flexeril.   6. IBS. She is on Bentyl  and Protonix.   7. History of pulmonary embolism. She is on a Eliquis. She was ruled out for DVT. Medicine consult is greatly appreciated.   8. Metabolic syndrome monitoring. Cholesterol is elevated, TSH is normal, Hemoglobin A1c 5.9.     9. Fall risk. PT input is greatly appreciated. We provided a walker.    10. Insomnia. We will increase Trazodone.    11. Disposition. She was discharged to transitional housing. She will follow up with [facility].     Physical Findings: AIMS:  , ,  ,  ,    CIWA:    COWS:     Musculoskeletal: Strength & Muscle Tone: within normal limits Gait & Station: normal Patient leans: N/A  Psychiatric Specialty Exam:  Physical Exam  Nursing note and vitals reviewed.   Review of Systems  Musculoskeletal: Positive for back pain and joint pain.  All other systems reviewed and are negative.   Blood pressure 129/80, pulse 78, temperature 98.2 F (36.8 C), resp. rate 18, height 5\' 6"  (1.676 m), weight 86.2 kg (190 lb), SpO2 100 %.Body mass index is 30.67 kg/m.  General Appearance: Casual  Eye Contact:  Good  Speech:  Clear and Coherent  Volume:  Normal  Mood:  Euthymic  Affect:  Appropriate  Thought Process:  Goal Directed and Descriptions of Associations: Intact  Orientation:  Full (Time, Place, and Person)  Thought Content:  WDL  Suicidal Thoughts:  No  Homicidal Thoughts:  No  Memory:  Immediate;   Fair Recent;   Fair Remote;   Fair  Judgement:  Impaired  Insight:  Present  Psychomotor Activity:  Normal  Concentration:  Concentration: Fair and Attention Span: Fair  Recall:  [employer] of Knowledge:  Fair  Language:  Fair  Akathisia:  No  Handed:  Left  AIMS (if indicated):     Assets:  Communication Skills Desire for Improvement Financial Resources/Insurance Resilience Social Support Transportation  ADL's:  Intact  Cognition:  WNL  Sleep:  Number of Hours: 5.5        Has this patient used any form of tobacco in the last 30  days? (Cigarettes, Smokeless Tobacco, Cigars, and/or Pipes) Yes, No  Blood Alcohol level:  Lab Results  Component Value Date   ETH <5 [id number]    Metabolic Disorder Labs:  Lab Results  Component Value Date   HGBA1C 5.9 (H) 08/29/2016   MPG 123 08/29/2016   MPG 140 (H) 09/19/2014   No results found for: PROLACTIN Lab Results  Component Value Date   CHOL 251 (H) 08/29/2016   TRIG 83 08/29/2016   HDL 38 (L) 08/29/2016   CHOLHDL 6.6 08/29/2016   VLDL 17 08/29/2016   LDLCALC 196 (H) 08/29/2016    See Psychiatric Specialty Exam and Suicide Risk Assessment completed by Attending Physician prior to discharge.  Discharge destination:  Other:  transitional jousing.  Is patient on multiple antipsychotic therapies at discharge:  No   Has Patient had three or more failed trials of antipsychotic monotherapy by history:  No  Recommended Plan for Multiple Antipsychotic Therapies: NA  Discharge Instructions    Diet - low sodium heart healthy    Complete by:  As directed    Diet - low sodium heart healthy    Complete by:  As directed    Increase activity slowly    Complete by:  As directed    Increase activity slowly    Complete by:  As directed      Allergies as of 09/04/2016      Reactions   Abilify [aripiprazole] Other (See Comments)   Jerking movement, slow motor skills   Augmentin [amoxicillin-pot Clavulanate] Diarrhea, Nausea And Vomiting   Bactrim [sulfamethoxazole-trimethoprim] Diarrhea   Ceftin [cefuroxime Axetil] Diarrhea   Coconut Oil    Rash    Diclofenac Other (See Comments)    Muscle Pain   Dye Fdc Red [red Dye]    "hair dye"   Indocin [indomethacin] Other (See Comments)   Migraines    Linaclotide Diarrhea   Lisinopril Diarrhea, Other (See Comments)   Other reaction(s): Dizziness, Vomiting   Morphine Other (See Comments)   Chest Tightness   Morphine And Related Other (See Comments)   Chest tightness  Simvastatin Other (See Comments)   Muscle Pain    Sulfa Antibiotics Diarrhea   Sulfamethoxazole-trimethoprim Diarrhea   Wellbutrin [bupropion] Other (See Comments)   Jerking movement Slurred speech   Quetiapine Fumarate Palpitations      Medication List    STOP taking these medications   benzonatate 100 MG capsule Commonly known as:  TESSALON   calcium carbonate 500 MG chewable tablet Commonly known as:  TUMS - dosed in mg elemental calcium   diphenhydrAMINE 25 MG tablet Commonly known as:  BENADRYL   EPINEPHrine 0.3 mg/0.3 mL Soaj injection Commonly known as:  EPI-PEN   multivitamin with minerals Tabs tablet   mupirocin ointment 2 % Commonly known as:  BACTROBAN   ondansetron 4 MG tablet Commonly known as:  ZOFRAN   potassium chloride 10 MEQ tablet Commonly known as:  K-DUR   triamcinolone 55 MCG/ACT Aero nasal inhaler Commonly known as:  NASACORT AQ     TAKE these medications     Indication  albuterol 108 (90 Base) MCG/ACT inhaler Commonly known as:  PROVENTIL HFA;VENTOLIN HFA Inhale 2 puffs into the lungs every 6 (six) hours as needed for wheezing or shortness of breath.  Indication:  Chronic Obstructive Lung Disease   apixaban 5 MG Tabs tablet Commonly known as:  ELIQUIS Take 1 tablet (5 mg total) by mouth 2 (two) times daily.  Indication:  Blood Clot in a Deep Vein   carvedilol 3.125 MG tablet Commonly known as:  COREG Take 1 tablet (3.125 mg total) by mouth 2 (two) times daily with a meal.  Indication:  Chronic Angina Following Exercise, Stress, or Excitement   dicyclomine 20 MG tablet Commonly known as:  BENTYL Take 1 tablet (20 mg total) by mouth 3 (three) times daily as needed for spasms. What changed:  Another medication with the same name was removed. Continue taking this medication, and follow the directions you see here.  Indication:  Irritable Bowel Syndrome   diltiazem 60 MG 12 hr capsule Commonly known as:  CARDIZEM SR Take 1 capsule (60 mg total) by mouth every 12 (twelve) hours. What  changed:  when to take this  reasons to take this  Indication:  High Blood Pressure Disorder   escitalopram 20 MG tablet Commonly known as:  LEXAPRO Take 1 tablet (20 mg total) by mouth daily. What changed:  medication strength  how much to take  Indication:  Major Depressive Disorder   furosemide 20 MG tablet Commonly known as:  LASIX Take 1 tablet (20 mg total) by mouth daily. What changed:  See the new instructions.  Indication:  Edema   gabapentin 600 MG tablet Commonly known as:  NEURONTIN Take 0.5 tablets (300 mg total) by mouth 3 (three) times daily. What changed:  when to take this  Indication:  Neurogenic Pain   isosorbide mononitrate 30 MG 24 hr tablet Commonly known as:  IMDUR Take 1 tablet (30 mg total) by mouth daily.  Indication:  Stable Angina Pectoris   OLANZapine 10 MG tablet Commonly known as:  ZYPREXA Take 1 tablet (10 mg total) by mouth at bedtime.  Indication:  Manic-Depression   oxyCODONE-acetaminophen 5-325 MG tablet Commonly known as:  PERCOCET/ROXICET Take 1 tablet by mouth 2 (two) times daily as needed for severe pain.  Indication:  Pain   pantoprazole 40 MG tablet Commonly known as:  PROTONIX Take 1 tablet (40 mg total) by mouth daily. What changed:  See the new instructions.  Indication:  Gastroesophageal Reflux Disease  potassium chloride 10 MEQ tablet Commonly known as:  K-DUR,KLOR-CON Take 1 tablet (10 mEq total) by mouth daily.  Indication:  Low Amount of Potassium in the Blood   traZODone 150 MG tablet Commonly known as:  DESYREL Take 1 tablet (150 mg total) by mouth at bedtime. What changed:  medication strength  how much to take  Indication:  Trouble Sleeping     ASK your doctor about these medications     Indication  EPINEPHrine 0.3 mg/0.3 mL Soaj injection Commonly known as:  EPI-PEN Inject 0.3 mLs (0.3 mg total) into the muscle once. Ask about: Should I take this medication?  Indication:  Life-Threatening  Allergic Reaction            Durable Medical Equipment        Start     Ordered   09/02/16 1334  For home use only DME Walker rolling  Once    Question:  Patient needs a walker to treat with the following condition  Answer:  Falls   09/02/16 1333     Follow-up Information    [facility] Follow up.   Specialty:  Behavioral Health Why:  Please arrive to the walk-in clinic Monday through Friday from 9-4pm for your assessment and hospital follow up for medication management and therapy. Contact information: [place] [address] [phone]        [facility] Follow up.   Why:  Please arrive between the hours of 10am-3pm on Thursday January 4th to be admitted for long-term residential care and for assessment for eventual transition to independent living. Contact information: [facility] Attn: Kendra Opitz  [address] [place], [place] [address] Ph: [phone] Fax not required, or needed. Do not fax          Follow-up recommendations:  Activity:  As tolerated. Diet:  Low sodium heart healthy. Other:  Keep follow-up appointments.  Comments:    Signed: Orson Slick, MD 09/04/2016, 11:23 AM

## 2016-09-10 DIAGNOSIS — F250 Schizoaffective disorder, bipolar type: Secondary | ICD-10-CM | POA: Diagnosis not present

## 2016-09-12 ENCOUNTER — Encounter (HOSPITAL_COMMUNITY): Payer: Self-pay

## 2016-09-12 ENCOUNTER — Emergency Department (HOSPITAL_COMMUNITY)
Admission: EM | Admit: 2016-09-12 | Discharge: 2016-09-12 | Disposition: A | Discharge status: Home / Self Care | Payer: Medicare Other | Attending: Emergency Medicine | Admitting: Emergency Medicine

## 2016-09-12 DIAGNOSIS — Z7901 Long term (current) use of anticoagulants: Secondary | ICD-10-CM | POA: Diagnosis not present

## 2016-09-12 DIAGNOSIS — I10 Essential (primary) hypertension: Secondary | ICD-10-CM | POA: Diagnosis not present

## 2016-09-12 DIAGNOSIS — R21 Rash and other nonspecific skin eruption: Secondary | ICD-10-CM | POA: Diagnosis not present

## 2016-09-12 DIAGNOSIS — L259 Unspecified contact dermatitis, unspecified cause: Secondary | ICD-10-CM

## 2016-09-12 DIAGNOSIS — Z79899 Other long term (current) drug therapy: Secondary | ICD-10-CM | POA: Diagnosis not present

## 2016-09-12 DIAGNOSIS — Z87891 Personal history of nicotine dependence: Secondary | ICD-10-CM | POA: Diagnosis not present

## 2016-09-12 DIAGNOSIS — E119 Type 2 diabetes mellitus without complications: Secondary | ICD-10-CM | POA: Diagnosis not present

## 2016-09-12 DIAGNOSIS — Z96642 Presence of left artificial hip joint: Secondary | ICD-10-CM | POA: Insufficient documentation

## 2016-09-12 DIAGNOSIS — I252 Old myocardial infarction: Secondary | ICD-10-CM | POA: Diagnosis not present

## 2016-09-12 DIAGNOSIS — F250 Schizoaffective disorder, bipolar type: Secondary | ICD-10-CM | POA: Diagnosis not present

## 2016-09-12 MED ORDER — FAMOTIDINE 20 MG PO TABS: 20.0000 mg | ORAL_TABLET | Freq: Two times a day (BID) | ORAL | 0 refills | Status: DC

## 2016-09-12 MED ORDER — DIPHENHYDRAMINE HCL 25 MG PO TABS: 25.0000 mg | ORAL_TABLET | Freq: Four times a day (QID) | ORAL | 0 refills | Status: DC

## 2016-09-12 NOTE — ED Provider Notes (Signed)
[facility] DEPT Provider Note   CSN: [id number] Arrival date & time: 09/12/16  0123     History   Chief Complaint Chief Complaint  Patient presents with  . Rash    HPI Valerie Vazquez is a 61 y.o. female.  HPI Patient reports a new rash across her arms trunk and back.  This is been ongoing over the past 3 days.  She recently moved into a group home and replaced on the bed and they were not laundered prior to this.  No new soaps or detergents.  No other new irritants identified.  No fevers or chills.  She reports she is itching all over.  She has not tried any medications prior to arrival.  No history of asthma or eczema.   Past Medical History:  Diagnosis Date  . Anemia   . Bilateral renal cysts   . Chronic back pain    "upper and lower" (09/19/2014)  . Depression    "major" (09/19/2014)  . DVT (deep venous thrombosis) ([employer])   . Esophageal spasm   . Gallstones   . Gastritis   . Gastroenteritis   . GERD (gastroesophageal reflux disease)   . Headache    "couple /month lately" (09/19/2014)  . High blood pressure   . High cholesterol   . History of blood transfusion 1985   related to hysterectomy  . Hypertension   . IBS (irritable bowel syndrome)   . Migraine    "a few times/yr" (09/19/2014)  . Myocardial infarction 10/2010 X 3   "while hospitalized"  . Osteoarthritis    "qwhere; mostly around my joints" (09/19/2014)  . Pneumonia 04/2014  . PTSD (post-traumatic stress disorder)   . Pulmonary embolism ([place]) 04/2001; 09/19/2014   "after gallbladder OR; "  . Schizoaffective disorder ([place])   . Sleep apnea    "mild" (09/19/2014)  . Snoring    a. sleep study 5/16: No OSA  . Spinal stenosis   . Spondylosis   . Type II diabetes mellitus ([place])    "dx'd in 2007; lost weight; no RX for ~ 4 yr now" (09/19/2014)    Patient Active Problem List   Diagnosis Date Noted  . Schizoaffective disorder, depressive type ([place]) 08/28/2016  . Sinusitis, acute maxillary 08/27/2016   . Suicide attempt 08/27/2016  . Rash and nonspecific skin eruption 04/16/2016  . Colitis 04/08/2016  . Tremor 03/19/2016  . Left shoulder pain 02/18/2016  . Abdominal distension 02/18/2016  . Absolute anemia 01/29/2016  . Chronic pain 01/29/2016  . Neurogenic pain 01/29/2016  . Musculoskeletal pain 01/29/2016  . Long term current use of opiate analgesic 01/29/2016  . Long term prescription opiate use 01/29/2016  . Opiate use (15 MME/Day) 01/29/2016  . Encounter for therapeutic drug level monitoring 01/29/2016  . Encounter for pain management planning 01/29/2016  . Chronic neck pain (midline) 01/29/2016  . Chronic chest wall pain 01/29/2016  . Chronic low back pain (Location of Secondary source of pain) (Bilateral) (R>L) 01/29/2016  . Disturbance of skin sensation 01/29/2016  . Chronic shoulder pain (Location of Primary Source of Pain) (Left) 01/29/2016  . Chronic hip pain (Location of Tertiary source of pain) (Bilateral) (R>L) 01/29/2016  . Osteoarthritis of hip (Bilateral) 01/29/2016  . Lumbar facet arthropathy (Severe at L2-3, L3-4, L4-5, and L5-S1) 01/29/2016  . Lumbar central spinal stenosis (moderate at L4-5; mild at L2-3 and L3-4) 01/29/2016  . Lumbar foraminal stenosis (L4-5) (Right) 01/29/2016  . Lumbar facet syndrome 01/29/2016  . Hx of cervical spine  surgery 01/29/2016  . Numbness 11/19/2015  . Right wrist pain 11/09/2015  . Breast pain 10/19/2015  . Chest pain 10/14/2015  . Headache 10/14/2015  . Abdominal pain 10/14/2015  . Depression 10/14/2015  . Lip swelling 10/14/2015  . Pulmonary embolism ([place]) 08/15/2015  . Bilateral pneumonia 08/15/2015  . Nausea 08/15/2015  . Shortness of breath 07/31/2015  . Pleural effusion, bilateral 07/31/2015  . Cough 07/31/2015  . Chronic pain of multiple sites 06/05/2015  . Chronic lumbosacral pain 06/05/2015  . Esophageal spasm 06/05/2015  . History of DVT (deep vein thrombosis) 02/25/2015  . 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  Constipation 12/12/2014  . Coronary vasospasm ([place]) 12/09/2014  . History of pulmonary embolus (PE) 09/19/2014  . Impingement syndrome of shoulder 08/30/2014  . Impingement syndrome of left shoulder 08/30/2014  . Injury of superior glenoid labrum of shoulder joint 08/16/2014  . Supraspinatus tenosynovitis 08/16/2014  . Cervical spondylosis without myelopathy 08/16/2014  . Anxiety and depression 04/13/2014  . Osteoarthritis of hip (Left) 04/11/2014  . S/P THR (total hip replacement) (Left) 04/11/2014  . IBS (irritable bowel syndrome) 04/11/2014  . GERD (gastroesophageal reflux disease) 04/11/2014  . Insomnia 04/11/2014  . Essential hypertension, benign 10/28/2013  . Left hip pain 10/28/2013  . Anemia 10/28/2013  . HLD (hyperlipidemia) 10/28/2013    Past Surgical History:  Procedure Laterality Date  . ABDOMINAL HYSTERECTOMY  1985  . ANTERIOR CERVICAL DECOMP/DISCECTOMY FUSION  10/2012  . APPENDECTOMY  1985  . BACK SURGERY    . BREAST CYST EXCISION Right   . BREAST EXCISIONAL BIOPSY Right YRS AGO   NEG  . CARDIAC CATHETERIZATION   2000's; 2009  . CHOLECYSTECTOMY  2002  . EXCISION/RELEASE BURSA HIP Right 12/1984  . HERNIA REPAIR  1985  . LEFT HEART CATHETERIZATION WITH CORONARY ANGIOGRAM N/A 12/09/2014   Procedure: LEFT HEART CATHETERIZATION WITH CORONARY ANGIOGRAM;  Surgeon: Burnell Blanks, MD;  Location: [facility] CATH LAB;  Service: Cardiovascular;  Laterality: N/A;  . TONSILLECTOMY AND ADENOIDECTOMY  ~ 1968  . TOTAL HIP ARTHROPLASTY Left 04-06-2014  . UMBILICAL HERNIA REPAIR  1985  . VENA CAVA FILTER PLACEMENT  03/2014    OB History    No data available       Home Medications    Prior to Admission medications   Medication Sig Start Date End Date Taking? Authorizing Provider  albuterol (PROVENTIL HFA;VENTOLIN HFA) 108 (90 Base) MCG/ACT inhaler Inhale 2 puffs into the lungs every 6 (six) hours as needed for wheezing or shortness of breath. 09/03/16   Clovis Fredrickson, MD   apixaban (ELIQUIS) 5 MG TABS tablet Take 1 tablet (5 mg total) by mouth 2 (two) times daily. 09/03/16   Clovis Fredrickson, MD  carvedilol (COREG) 3.125 MG tablet Take 1 tablet (3.125 mg total) by mouth 2 (two) times daily with a meal. 09/03/16   Jolanta B Pucilowska, MD  dicyclomine (BENTYL) 20 MG tablet Take 1 tablet (20 mg total) by mouth 3 (three) times daily as needed for spasms. 09/03/16 09/03/17  Clovis Fredrickson, MD  diltiazem (CARDIZEM SR) 60 MG 12 hr capsule Take 1 capsule (60 mg total) by mouth every 12 (twelve) hours. 09/03/16   Clovis Fredrickson, MD  diphenhydrAMINE (BENADRYL) 25 MG tablet Take 1 tablet (25 mg total) by mouth every 6 (six) hours. 09/12/16   Jola Schmidt, MD  escitalopram (LEXAPRO) 20 MG tablet Take 1 tablet (20 mg total) by mouth daily. 09/04/16   Clovis Fredrickson, MD  famotidine (PEPCID) 20  MG tablet Take 1 tablet (20 mg total) by mouth 2 (two) times daily. 09/12/16   Jola Schmidt, MD  furosemide (LASIX) 20 MG tablet Take 1 tablet (20 mg total) by mouth daily. 09/04/16   Clovis Fredrickson, MD  gabapentin (NEURONTIN) 600 MG tablet Take 0.5 tablets (300 mg total) by mouth 3 (three) times daily. 09/03/16   Clovis Fredrickson, MD  isosorbide mononitrate (IMDUR) 30 MG 24 hr tablet Take 1 tablet (30 mg total) by mouth daily. 09/03/16   Clovis Fredrickson, MD  OLANZapine (ZYPREXA) 10 MG tablet Take 1 tablet (10 mg total) by mouth at bedtime. 09/03/16   Clovis Fredrickson, MD  pantoprazole (PROTONIX) 40 MG tablet Take 1 tablet (40 mg total) by mouth daily. 09/04/16   Clovis Fredrickson, MD  potassium chloride SA (K-DUR,KLOR-CON) 10 MEQ tablet Take 1 tablet (10 mEq total) by mouth daily. 09/04/16   Clovis Fredrickson, MD  traZODone (DESYREL) 150 MG tablet Take 1 tablet (150 mg total) by mouth at bedtime. 09/03/16   Clovis Fredrickson, MD    Family History Family History  Problem Relation Age of Onset  . Stroke Mother     Deceased  . Lung cancer Father     Deceased  .  Healthy Daughter   . Healthy Son     x 2  . Other Son     Suicide  . Heart attack Neg Hx     Social History Social History  Substance Use Topics  . Smoking status: Former Smoker    Packs/day: 1.50    Years: 10.00    Types: Cigarettes    Quit date: 04/12/1979  . Smokeless tobacco: Never Used  . Alcohol use No     Allergies   Abilify [aripiprazole]; Augmentin [amoxicillin-pot clavulanate]; Bactrim [sulfamethoxazole-trimethoprim]; Ceftin [cefuroxime axetil]; Coconut oil; Diclofenac; Dye fdc red [red dye]; Indocin [indomethacin]; Linaclotide; Lisinopril; Morphine; Morphine and related; Simvastatin; Sulfa antibiotics; Sulfamethoxazole-trimethoprim; Wellbutrin [bupropion]; and Quetiapine fumarate   Review of Systems Review of Systems  All other systems reviewed and are negative.    Physical Exam Updated Vital Signs BP 137/77   Pulse 66   Temp 98.1 F (36.7 C) (Oral)   Resp 17   SpO2 98%   Physical Exam  Constitutional: She is oriented to person, place, and time. She appears well-developed and well-nourished.  HENT:  Head: Normocephalic.  Eyes: EOM are normal.  Neck: Normal range of motion.  Pulmonary/Chest: Effort normal.  Abdominal: She exhibits no distension.  Musculoskeletal: Normal range of motion.  Neurological: She is alert and oriented to person, place, and time.  Skin:  Diffuse papular rash across her arms, chest, abdomen, back.  Psychiatric: She has a normal mood and affect.  Nursing note and vitals reviewed.    ED Treatments / Results  Labs (all labs ordered are listed, but only abnormal results are displayed) Labs Reviewed - No data to display  EKG  EKG Interpretation None       Radiology No results found.  Procedures Procedures (including critical care time)  Medications Ordered in ED Medications - No data to display   Initial Impression / Assessment and Plan / ED Course  I have reviewed the triage vital signs and the nursing  notes.  Pertinent labs & imaging results that were available during my care of the patient were reviewed by me and considered in my medical decision making (see chart for details).  Clinical Course     Patient be treated  for contact dermatitis with 1% hydrocortisone cream, Benadryl, Pepcid.  I recommended that she launder sheets and evaluate her environment for any new possible exposures  Final Clinical Impressions(s) / ED Diagnoses   Final diagnoses:  Contact dermatitis, unspecified contact dermatitis type, unspecified trigger    New Prescriptions New Prescriptions   DIPHENHYDRAMINE (BENADRYL) 25 MG TABLET    Take 1 tablet (25 mg total) by mouth every 6 (six) hours.   FAMOTIDINE (PEPCID) 20 MG TABLET    Take 1 tablet (20 mg total) by mouth 2 (two) times daily.     Jola Schmidt, MD 09/12/16 [phone]

## 2016-09-12 NOTE — ED Triage Notes (Signed)
Pt complaining of rash and itching on arms and trunk. Pt with small diffuse bumps across chest and shoulders. Pt states ongoing x 3 days. Pt denies any SOB, CP or difficulty breathing.

## 2016-09-13 ENCOUNTER — Encounter (HOSPITAL_COMMUNITY): Payer: Self-pay | Admitting: Emergency Medicine

## 2016-09-13 ENCOUNTER — Emergency Department (HOSPITAL_COMMUNITY)
Admission: EM | Admit: 2016-09-13 | Discharge: 2016-09-13 | Disposition: A | Discharge status: Home / Self Care | Payer: Medicare Other | Attending: Emergency Medicine | Admitting: Emergency Medicine

## 2016-09-13 DIAGNOSIS — E119 Type 2 diabetes mellitus without complications: Secondary | ICD-10-CM | POA: Diagnosis not present

## 2016-09-13 DIAGNOSIS — Z7901 Long term (current) use of anticoagulants: Secondary | ICD-10-CM | POA: Diagnosis not present

## 2016-09-13 DIAGNOSIS — I10 Essential (primary) hypertension: Secondary | ICD-10-CM | POA: Diagnosis not present

## 2016-09-13 DIAGNOSIS — Z87891 Personal history of nicotine dependence: Secondary | ICD-10-CM | POA: Diagnosis not present

## 2016-09-13 DIAGNOSIS — Z79899 Other long term (current) drug therapy: Secondary | ICD-10-CM | POA: Insufficient documentation

## 2016-09-13 DIAGNOSIS — L509 Urticaria, unspecified: Secondary | ICD-10-CM | POA: Insufficient documentation

## 2016-09-13 DIAGNOSIS — Z96642 Presence of left artificial hip joint: Secondary | ICD-10-CM | POA: Insufficient documentation

## 2016-09-13 DIAGNOSIS — I252 Old myocardial infarction: Secondary | ICD-10-CM | POA: Insufficient documentation

## 2016-09-13 DIAGNOSIS — R21 Rash and other nonspecific skin eruption: Secondary | ICD-10-CM | POA: Diagnosis present

## 2016-09-13 MED ORDER — DEXAMETHASONE SODIUM PHOSPHATE 10 MG/ML IJ SOLN
10.0000 mg | Freq: Once | INTRAMUSCULAR | Status: AC
  Administered 2016-09-13: 10 mg via INTRAMUSCULAR
  Filled 2016-09-13: qty 1

## 2016-09-13 MED ORDER — TRIAMCINOLONE ACETONIDE 0.1 % EX CREA: 1.0000 "application " | TOPICAL_CREAM | Freq: Two times a day (BID) | CUTANEOUS | 0 refills | Status: DC

## 2016-09-13 NOTE — ED Provider Notes (Signed)
[facility] DEPT Provider Note   CSN: [id number] Arrival date & time: 09/13/16  1628     History   Chief Complaint Chief Complaint  Patient presents with  . Insect Bite    The patient is living in a group home and has bed bugs.  She said they are biting her all over her body.  She is itching and in pain.  She has been seen here before for the same thing and was givien prescriptions.  Nothing is helping.      HPI Valerie Vazquez is a 61 y.o. female.  HPI Patient presents for evaluation of rash. She was seen here yesterday for the same. She states it's gotten worse. She states it's been one on for the last 4 days. She notes hives to her trunk, bilateral upper and lower extremities. Associated symptoms include pruritus, warmth, and pain. No purulent drainage, fever, shortness of breath, facial swelling, difficulty breathing, or any other symptoms. She was discharged yesterday with Benadryl, hydrocortisone cream, and Pepcid. She states she has been taking the Benadryl and hydrocortisone cream with out relief. She is concerned she has bedbugs as she is recently moved into a group home. Nobody else has the symptoms.  Past Medical History:  Diagnosis Date  . Anemia   . Bilateral renal cysts   . Chronic back pain    "upper and lower" (09/19/2014)  . Depression    "major" (09/19/2014)  . DVT (deep venous thrombosis) ([place])   . Esophageal spasm   . Gallstones   . Gastritis   . Gastroenteritis   . GERD (gastroesophageal reflux disease)   . Headache    "couple /month lately" (09/19/2014)  . High blood pressure   . High cholesterol   . History of blood transfusion 1985   related to hysterectomy  . Hypertension   . IBS (irritable bowel syndrome)   . Migraine    "a few times/yr" (09/19/2014)  . Myocardial infarction 10/2010 X 3   "while hospitalized"  . Osteoarthritis    "qwhere; mostly around my joints" (09/19/2014)  . Pneumonia 04/2014  . PTSD (post-traumatic stress disorder)    . Pulmonary embolism ([place]) 04/2001; 09/19/2014   "after gallbladder OR; "  . Schizoaffective disorder ([place])   . Sleep apnea    "mild" (09/19/2014)  . Snoring    a. sleep study 5/16: No OSA  . Spinal stenosis   . Spondylosis   . Type II diabetes mellitus ([place])    "dx'd in 2007; lost weight; no RX for ~ 4 yr now" (09/19/2014)    Patient Active Problem List   Diagnosis Date Noted  . Schizoaffective disorder, depressive type ([place]) 08/28/2016  . Sinusitis, acute maxillary 08/27/2016  . Suicide attempt 08/27/2016  . Rash and nonspecific skin eruption 04/16/2016  . Colitis 04/08/2016  . Tremor 03/19/2016  . Left shoulder pain 02/18/2016  . Abdominal distension 02/18/2016  . Absolute anemia 01/29/2016  . Chronic pain 01/29/2016  . Neurogenic pain 01/29/2016  . Musculoskeletal pain 01/29/2016  . Long term current use of opiate analgesic 01/29/2016  . Long term prescription opiate use 01/29/2016  . Opiate use (15 MME/Day) 01/29/2016  . Encounter for therapeutic drug level monitoring 01/29/2016  . Encounter for pain management planning 01/29/2016  . Chronic neck pain (midline) 01/29/2016  . Chronic chest wall pain 01/29/2016  . Chronic low back pain (Location of Secondary source of pain) (Bilateral) (R>L) 01/29/2016  . Disturbance of skin sensation 01/29/2016  . Chronic shoulder pain (  Location of Primary Source of Pain) (Left) 01/29/2016  . Chronic hip pain (Location of Tertiary source of pain) (Bilateral) (R>L) 01/29/2016  . Osteoarthritis of hip (Bilateral) 01/29/2016  . Lumbar facet arthropathy (Severe at L2-3, L3-4, L4-5, and L5-S1) 01/29/2016  . Lumbar central spinal stenosis (moderate at L4-5; mild at L2-3 and L3-4) 01/29/2016  . Lumbar foraminal stenosis (L4-5) (Right) 01/29/2016  . Lumbar facet syndrome 01/29/2016  . Hx of cervical spine surgery 01/29/2016  . Numbness 11/19/2015  . Right wrist pain 11/09/2015  . Breast pain 10/19/2015  . Chest pain 10/14/2015  . Headache  10/14/2015  . Abdominal pain 10/14/2015  . Depression 10/14/2015  . Lip swelling 10/14/2015  . Pulmonary embolism ([place]) 08/15/2015  . Bilateral pneumonia 08/15/2015  . Nausea 08/15/2015  . Shortness of breath 07/31/2015  . Pleural effusion, bilateral 07/31/2015  . Cough 07/31/2015  . Chronic pain of multiple sites 06/05/2015  . Chronic lumbosacral pain 06/05/2015  . Esophageal spasm 06/05/2015  . History of DVT (deep vein thrombosis) 02/25/2015  . 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 Constipation 12/12/2014  . Coronary vasospasm ([place]) 12/09/2014  . History of pulmonary embolus (PE) 09/19/2014  . Impingement syndrome of shoulder 08/30/2014  . Impingement syndrome of left shoulder 08/30/2014  . Injury of superior glenoid labrum of shoulder joint 08/16/2014  . Supraspinatus tenosynovitis 08/16/2014  . Cervical spondylosis without myelopathy 08/16/2014  . Anxiety and depression 04/13/2014  . Osteoarthritis of hip (Left) 04/11/2014  . S/P THR (total hip replacement) (Left) 04/11/2014  . IBS (irritable bowel syndrome) 04/11/2014  . GERD (gastroesophageal reflux disease) 04/11/2014  . Insomnia 04/11/2014  . Essential hypertension, benign 10/28/2013  . Left hip pain 10/28/2013  . Anemia 10/28/2013  . HLD (hyperlipidemia) 10/28/2013    Past Surgical History:  Procedure Laterality Date  . ABDOMINAL HYSTERECTOMY  1985  . ANTERIOR CERVICAL DECOMP/DISCECTOMY FUSION  10/2012  . APPENDECTOMY  1985  . BACK SURGERY    . BREAST CYST EXCISION Right   . BREAST EXCISIONAL BIOPSY Right YRS AGO   NEG  . CARDIAC CATHETERIZATION   2000's; 2009  . CHOLECYSTECTOMY  2002  . EXCISION/RELEASE BURSA HIP Right 12/1984  . HERNIA REPAIR  1985  . LEFT HEART CATHETERIZATION WITH CORONARY ANGIOGRAM N/A 12/09/2014   Procedure: LEFT HEART CATHETERIZATION WITH CORONARY ANGIOGRAM;  Surgeon: Burnell Blanks, MD;  Location: [facility] CATH LAB;  Service: Cardiovascular;  Laterality: N/A;  . TONSILLECTOMY AND  ADENOIDECTOMY  ~ 1968  . TOTAL HIP ARTHROPLASTY Left 04-06-2014  . UMBILICAL HERNIA REPAIR  1985  . VENA CAVA FILTER PLACEMENT  03/2014    OB History    No data available       Home Medications    Prior to Admission medications   Medication Sig Start Date End Date Taking? Authorizing Provider  albuterol (PROVENTIL HFA;VENTOLIN HFA) 108 (90 Base) MCG/ACT inhaler Inhale 2 puffs into the lungs every 6 (six) hours as needed for wheezing or shortness of breath. 09/03/16   Clovis Fredrickson, MD  apixaban (ELIQUIS) 5 MG TABS tablet Take 1 tablet (5 mg total) by mouth 2 (two) times daily. 09/03/16   Clovis Fredrickson, MD  carvedilol (COREG) 3.125 MG tablet Take 1 tablet (3.125 mg total) by mouth 2 (two) times daily with a meal. 09/03/16   Jolanta B Pucilowska, MD  dicyclomine (BENTYL) 20 MG tablet Take 1 tablet (20 mg total) by mouth 3 (three) times daily as needed for spasms. 09/03/16 09/03/17  Clovis Fredrickson, MD  diltiazem (CARDIZEM SR) 60 MG 12 hr capsule Take 1 capsule (60 mg total) by mouth every 12 (twelve) hours. 09/03/16   Clovis Fredrickson, MD  diphenhydrAMINE (BENADRYL) 25 MG tablet Take 1 tablet (25 mg total) by mouth every 6 (six) hours. 09/12/16   Jola Schmidt, MD  escitalopram (LEXAPRO) 20 MG tablet Take 1 tablet (20 mg total) by mouth daily. 09/04/16   Clovis Fredrickson, MD  famotidine (PEPCID) 20 MG tablet Take 1 tablet (20 mg total) by mouth 2 (two) times daily. 09/12/16   Jola Schmidt, MD  furosemide (LASIX) 20 MG tablet Take 1 tablet (20 mg total) by mouth daily. 09/04/16   Clovis Fredrickson, MD  gabapentin (NEURONTIN) 600 MG tablet Take 0.5 tablets (300 mg total) by mouth 3 (three) times daily. 09/03/16   Clovis Fredrickson, MD  isosorbide mononitrate (IMDUR) 30 MG 24 hr tablet Take 1 tablet (30 mg total) by mouth daily. 09/03/16   Clovis Fredrickson, MD  OLANZapine (ZYPREXA) 10 MG tablet Take 1 tablet (10 mg total) by mouth at bedtime. 09/03/16   Clovis Fredrickson, MD    pantoprazole (PROTONIX) 40 MG tablet Take 1 tablet (40 mg total) by mouth daily. 09/04/16   Clovis Fredrickson, MD  potassium chloride SA (K-DUR,KLOR-CON) 10 MEQ tablet Take 1 tablet (10 mEq total) by mouth daily. 09/04/16   Clovis Fredrickson, MD  traZODone (DESYREL) 150 MG tablet Take 1 tablet (150 mg total) by mouth at bedtime. 09/03/16   Clovis Fredrickson, MD  triamcinolone cream (KENALOG) 0.1 % Apply 1 application topically 2 (two) times daily. 09/13/16   Gloriann Loan, PA-C    Family History Family History  Problem Relation Age of Onset  . Stroke Mother     Deceased  . Lung cancer Father     Deceased  . Healthy Daughter   . Healthy Son     x 2  . Other Son     Suicide  . Heart attack Neg Hx     Social History Social History  Substance Use Topics  . Smoking status: Former Smoker    Packs/day: 1.50    Years: 10.00    Types: Cigarettes    Quit date: 04/12/1979  . Smokeless tobacco: Never Used  . Alcohol use No     Allergies   Abilify [aripiprazole]; Augmentin [amoxicillin-pot clavulanate]; Bactrim [sulfamethoxazole-trimethoprim]; Ceftin [cefuroxime axetil]; Coconut oil; Diclofenac; Dye fdc red [red dye]; Indocin [indomethacin]; Linaclotide; Lisinopril; Morphine; Morphine and related; Simvastatin; Sulfa antibiotics; Sulfamethoxazole-trimethoprim; Wellbutrin [bupropion]; and Quetiapine fumarate   Review of Systems Review of Systems All other systems negative unless otherwise stated in HPI   Physical Exam Updated Vital Signs BP 145/91   Pulse 95   Temp 98.1 F (36.7 C) (Oral)   Resp 16   Ht 5\' 6"  (1.676 m)   Wt 88.9 kg   SpO2 100%   BMI 31.64 kg/m   Physical Exam  Constitutional: She is oriented to person, place, and time. She appears well-developed and well-nourished.  HENT:  Head: Normocephalic and atraumatic.  Right Ear: External ear normal.  Left Ear: External ear normal.  Mouth/Throat: Oropharynx is clear and moist.  Eyes: Conjunctivae are normal. No  scleral icterus.  Neck: No tracheal deviation present.  Pulmonary/Chest: Effort normal. No respiratory distress.  Abdominal: She exhibits no distension.  Musculoskeletal: Normal range of motion.  Neurological: She is alert and oriented to person, place, and time.  Skin: Skin is warm and dry. Rash noted.  Rash is urticarial.  Hives over torso and bilateral upper and lower extremities without superimposed infection.   Psychiatric: She has a normal mood and affect. Her behavior is normal.     ED Treatments / Results  Labs (all labs ordered are listed, but only abnormal results are displayed) Labs Reviewed - No data to display  EKG  EKG Interpretation None       Radiology No results found.  Procedures Procedures (including critical care time)  Medications Ordered in ED Medications  dexamethasone (DECADRON) injection 10 mg (10 mg Intramuscular Given 09/13/16 1746)     Initial Impression / Assessment and Plan / ED Course  I have reviewed the triage vital signs and the nursing notes.  Pertinent labs & imaging results that were available during my care of the patient were reviewed by me and considered in my medical decision making (see chart for details).  Clinical Course     Patient with urticarial eruption. No specific trigger. Discussed that urticaria can be trigger by stress heat cold and for unknown reasons. No signs of anaphylactic reaction; no new medications. Will treat with IM Decadron and Kenalog cream.  Continue Benadryl and Pepcid. Follow up with PCP in 2-3 days. Return precautions discussed. Pt is safe for discharge at this time.    Final Clinical Impressions(s) / ED Diagnoses   Final diagnoses:  Urticaria    New Prescriptions New Prescriptions   TRIAMCINOLONE CREAM (KENALOG) 0.1 %    Apply 1 application topically 2 (two) times daily.     Gloriann Loan, PA-C 09/13/16 [place], MD 09/14/16 [phone]

## 2016-09-13 NOTE — ED Triage Notes (Signed)
The patient is living in a group home and has bed bugs.  She said they are biting her all over her body.  She is itching and in pain.  She has been seen here before for the same thing and was givien prescriptions.  Nothing is helping.

## 2016-09-13 NOTE — ED Notes (Signed)
Declined W/C at D/C and was escorted to lobby by RN. 

## 2016-09-13 NOTE — Discharge Instructions (Signed)
Continue taking Benadryl for itching. Start using the Kenalog cream. [facility] to establish a primary care doctor. Return to the emergency department for any new or concerning symptoms.

## 2016-09-16 ENCOUNTER — Ambulatory Visit (INDEPENDENT_AMBULATORY_CARE_PROVIDER_SITE_OTHER): Payer: Medicare Other | Admitting: Family Medicine

## 2016-09-16 ENCOUNTER — Encounter: Payer: Self-pay | Admitting: Family Medicine

## 2016-09-16 VITALS — BP 136/72 | HR 63 | Temp 98.4°F | Wt 192.0 lb

## 2016-09-16 DIAGNOSIS — F418 Other specified anxiety disorders: Secondary | ICD-10-CM

## 2016-09-16 DIAGNOSIS — F329 Major depressive disorder, single episode, unspecified: Secondary | ICD-10-CM

## 2016-09-16 DIAGNOSIS — F419 Anxiety disorder, unspecified: Secondary | ICD-10-CM

## 2016-09-16 DIAGNOSIS — R1013 Epigastric pain: Secondary | ICD-10-CM | POA: Diagnosis not present

## 2016-09-16 DIAGNOSIS — F32A Depression, unspecified: Secondary | ICD-10-CM

## 2016-09-16 DIAGNOSIS — R197 Diarrhea, unspecified: Secondary | ICD-10-CM | POA: Diagnosis not present

## 2016-09-16 DIAGNOSIS — R21 Rash and other nonspecific skin eruption: Secondary | ICD-10-CM | POA: Diagnosis not present

## 2016-09-16 LAB — CBC
HCT: 33.2 % — ABNORMAL LOW (ref 35.0–45.0)
Hemoglobin: 10.7 g/dL — ABNORMAL LOW (ref 11.7–15.5)
MCH: 29.7 pg (ref 27.0–33.0)
MCHC: 32.2 g/dL (ref 32.0–36.0)
MCV: 92.2 fL (ref 80.0–100.0)
MPV: 9.1 fL (ref 7.5–12.5)
Platelets: 419 10*3/uL — ABNORMAL HIGH (ref 140–400)
RBC: 3.6 MIL/uL — ABNORMAL LOW (ref 3.80–5.10)
RDW: 13.9 % (ref 11.0–15.0)
WBC: 9.1 10*3/uL (ref 3.8–10.8)

## 2016-09-16 MED ORDER — CYCLOBENZAPRINE HCL 10 MG PO TABS: 5.0000 mg | ORAL_TABLET | Freq: Three times a day (TID) | ORAL | 0 refills | Status: DC | PRN

## 2016-09-16 MED ORDER — GABAPENTIN 600 MG PO TABS: 300.0000 mg | ORAL_TABLET | Freq: Three times a day (TID) | ORAL | 1 refills | Status: DC

## 2016-09-16 MED ORDER — DICYCLOMINE HCL 20 MG PO TABS: 20.0000 mg | ORAL_TABLET | Freq: Three times a day (TID) | ORAL | 1 refills | Status: DC | PRN

## 2016-09-16 MED ORDER — ISOSORBIDE MONONITRATE ER 30 MG PO TB24: 30.0000 mg | ORAL_TABLET | Freq: Every day | ORAL | 1 refills | Status: DC

## 2016-09-16 NOTE — Assessment & Plan Note (Signed)
Patient has done quite well since getting out of the hospital after a suicide attempt and SI. Has had great benefit from the Lexapro and Zyprexa. Discussed continuing his medications and continuing to follow with psychiatry. Encouraged her to seek therapy through her psychiatrist as well. She is given return precautions.

## 2016-09-16 NOTE — Assessment & Plan Note (Signed)
Patient with chronic intermittent epigastric discomfort for some time. Specifically associated with her diarrhea. Possibly related to gastritis. Doubt pancreatic or hepatic cause given benign exam today. Benign abdominal exam. We'll obtain lab work as outlined below to evaluate for possible causes. Could consider referral to GI once lab work returns. Into the Protonix. Given return precautions.

## 2016-09-16 NOTE — Progress Notes (Signed)
Pre visit review using our clinic review tool, if applicable. No additional management support is needed unless otherwise documented below in the visit note. 

## 2016-09-16 NOTE — Progress Notes (Signed)
Valerie Rumps, MD Phone: [phone]  Valerie Vazquez is a 61 y.o. female who presents today for follow-up.  Patient previously hospitalized for suicidal thoughts and suicide attempt. Was continued on Lexapro and started on Zyprexa. She notes her mood is improved though has been up and down at times. She notes no SI or HI. She notes she was supposed to go to group therapy though evidently her insurance will not cover this. She has seen a psychiatrist through [place].  Patient notes she has also been dealing with a rash and itching that she believes is due to bedbugs. She notes she has seen bedbugs at the group home that she is currently staying at. She's been evaluated twice in the emergency room for this. The second time they gave her a steroid injection and advised Pepcid and Benadryl and felt that it was urticarial in nature. She is unable to afford Pepcid or the steroid cream that they prescribed. She notes no new soaps or detergents. No fevers or chills. No throat swelling or tongue swelling or breathing issues. She notes the rash is quite a bit better than it was previously.  Patient also notes some diarrhea since last week. She notes chronic epigastric discomfort that is unchanged from previously and not necessarily associated with her diarrhea. None at this time. Is off and on at times. The sharp in nature. Last briefly. No bright red blood per rectum. No melena. Some nausea. No vomiting. Describes her diarrhea is brown with pink mixed in. Takes Protonix.  PMH: Former smoker   ROS see history of present illness  Objective  Physical Exam Vitals:   09/16/16 1558  BP: 136/72  Pulse: 63  Temp: 98.4 F (36.9 C)    BP Readings from Last 3 Encounters:  09/16/16 136/72  09/13/16 145/91  09/12/16 137/77   Wt Readings from Last 3 Encounters:  09/16/16 192 lb (87.1 kg)  09/13/16 196 lb (88.9 kg)  08/28/16 190 lb (86.2 kg)    Physical Exam  Constitutional: No distress.    Cardiovascular: Normal rate, regular rhythm and normal heart sounds.   Pulmonary/Chest: Effort normal and breath sounds normal.  Abdominal: Soft. Bowel sounds are normal. She exhibits no distension. There is no tenderness. There is no rebound and no guarding.  Genitourinary:  Genitourinary Comments: Normal rectum, no blood in the rectal vault, guaiac result was negative though is equivocal given that there is no stool  Musculoskeletal: She exhibits no edema.  Neurological: She is alert. Gait normal.  Skin: Skin is warm and dry. She is not diaphoretic.  Psychiatric:  Mood depressed, affect flat though improved from previously     Assessment/Plan: Please see individual problem list.  Anxiety and depression Patient has done quite well since getting out of the hospital after a suicide attempt and SI. Has had great benefit from the Lexapro and Zyprexa. Discussed continuing his medications and continuing to follow with psychiatry. Encouraged her to seek therapy through her psychiatrist as well. She is given return precautions.  Rash and nonspecific skin eruption Patient with onset of rash last week in setting of seeing bedbugs at the home that she is currently staying. Suspect related to bedbugs though were described as urticarial lesion in the emergency room. She has not had any anaphylactic-like reactions with this. Rash has improved somewhat since being evaluated in the emergency room per patient report. Encouraged continued Benadryl use if needed. Pepcid if she is able to afford it. Advised that she needs to get  away from the bedbug situation if possible. If not improving when she is way from the bedbug situation she will let us know. Given return precautions.  Diarrhea Possibly viral or food mediated. Patient with benign exam today. She reports seeing pink material in her stool. FOBT attempted today though no stool noted. Patient will be given stool cards to complete for that. We'll check a  CBC as well. Discussed continuing to monitor this and if not improving being reevaluated. Given return precautions.  Epigastric discomfort Patient with chronic intermittent epigastric discomfort for some time. Specifically associated with her diarrhea. Possibly related to gastritis. Doubt pancreatic or hepatic cause given benign exam today. Benign abdominal exam. We'll obtain lab work as outlined below to evaluate for possible causes. Could consider referral to GI once lab work returns. Into the Protonix. Given return precautions.   Orders Placed This Encounter  Procedures  . CBC  . Comp Met (CMET)  . Lipase    Meds ordered this encounter  Medications  . isosorbide mononitrate (IMDUR) 30 MG 24 hr tablet    Sig: Take 1 tablet (30 mg total) by mouth daily.    Dispense:  30 tablet    Refill:  1  . dicyclomine (BENTYL) 20 MG tablet    Sig: Take 1 tablet (20 mg total) by mouth 3 (three) times daily as needed for spasms.    Dispense:  90 tablet    Refill:  1  . gabapentin (NEURONTIN) 600 MG tablet    Sig: Take 0.5 tablets (300 mg total) by mouth 3 (three) times daily.    Dispense:  90 tablet    Refill:  1  . cyclobenzaprine (FLEXERIL) 10 MG tablet    Sig: Take 0.5 tablets (5 mg total) by mouth 3 (three) times daily as needed for muscle spasms.    Dispense:  30 tablet    Refill:  0  Patient was given refills of these medicines when discharged from the hospital though apparently did not get these or take these to the pharmacy. We'll refill them at this time.  Valerie Rumps, MD [place]

## 2016-09-16 NOTE — Assessment & Plan Note (Signed)
Possibly viral or food mediated. Patient with benign exam today. She reports seeing pink material in her stool. FOBT attempted today though no stool noted. Patient will be given stool cards to complete for that. We'll check a CBC as well. Discussed continuing to monitor this and if not improving being reevaluated. Given return precautions.

## 2016-09-16 NOTE — Patient Instructions (Signed)
Nice to see you. Please keep follow-up with the psychiatrist and continue your Lexapro and Zyprexa. We are going to obtain some lab work to evaluate for a cause of your abdominal discomfort. Please continue the Benadryl. I would suggest starting on Pepcid to help with the itching as well. The best thing you could do is to get away from the source of the bedbugs. You should also wash your clothes in hot water.  If you develop abdominal pain, blood in your stool, fevers, thoughts of harming herself or others, or any new or changing symptoms please seek medical attention.

## 2016-09-16 NOTE — Assessment & Plan Note (Signed)
Patient with onset of rash last week in setting of seeing bedbugs at the home that she is currently staying. Suspect related to bedbugs though were described as urticarial lesion in the emergency room. She has not had any anaphylactic-like reactions with this. Rash has improved somewhat since being evaluated in the emergency room per patient report. Encouraged continued Benadryl use if needed. Pepcid if she is able to afford it. Advised that she needs to get away from the bedbug situation if possible. If not improving when she is way from the bedbug situation she will let us know. Given return precautions.

## 2016-09-17 LAB — COMPREHENSIVE METABOLIC PANEL
ALT: 14 U/L (ref 6–29)
AST: 19 U/L (ref 10–35)
Albumin: 3.8 g/dL (ref 3.6–5.1)
Alkaline Phosphatase: 91 U/L (ref 33–130)
BUN: 11 mg/dL (ref 7–25)
CO2: 26 mmol/L (ref 20–31)
Calcium: 9.5 mg/dL (ref 8.6–10.4)
Chloride: 106 mmol/L (ref 98–110)
Creat: 0.85 mg/dL (ref 0.50–0.99)
Glucose, Bld: 100 mg/dL — ABNORMAL HIGH (ref 65–99)
Potassium: 4 mmol/L (ref 3.5–5.3)
Sodium: 143 mmol/L (ref 135–146)
Total Bilirubin: 0.2 mg/dL (ref 0.2–1.2)
Total Protein: 7 g/dL (ref 6.1–8.1)

## 2016-09-17 LAB — LIPASE: Lipase: 5 U/L — ABNORMAL LOW (ref 7–60)

## 2016-09-22 ENCOUNTER — Telehealth: Payer: Self-pay | Admitting: *Deleted

## 2016-09-22 NOTE — Telephone Encounter (Signed)
Pt stated that she's returning a call from 01/19  Pt contact [phone]

## 2016-09-22 NOTE — Telephone Encounter (Signed)
See other message

## 2016-09-23 ENCOUNTER — Telehealth: Payer: Self-pay | Admitting: Family Medicine

## 2016-09-23 NOTE — Telephone Encounter (Signed)
Patient should complete the stool cards and drop them off. She should monitor for further bleeding. We could recheck her hemoglobin as well. Set her up for lab appointment please. Thanks.

## 2016-09-23 NOTE — Telephone Encounter (Signed)
Spoke with patient states when collecting stool sample noticed hard piece of red material  less 3/4 of inch. When she was collecting stool sample.   No blood on toilet tissue. She has an history  IBS.  Patient states she has been fatigued. Stool cards haven't been dropped off yet. Not using stool softner.  Diet night before consisted of spaghetti.   Please advise.

## 2016-09-23 NOTE — Telephone Encounter (Signed)
Called and stated that when she was taking a stool sample she noticed a hard red piece of something in her stool and was hard and is concerned. Please advise,thank you!  Call pt @ [phone]

## 2016-09-24 ENCOUNTER — Other Ambulatory Visit: Payer: Medicare Other

## 2016-09-24 ENCOUNTER — Telehealth: Payer: Self-pay | Admitting: Radiology

## 2016-09-24 ENCOUNTER — Other Ambulatory Visit: Payer: Self-pay | Admitting: Radiology

## 2016-09-24 DIAGNOSIS — D649 Anemia, unspecified: Secondary | ICD-10-CM

## 2016-09-24 NOTE — Telephone Encounter (Signed)
See other message

## 2016-09-24 NOTE — Telephone Encounter (Signed)
Orders placed.

## 2016-09-24 NOTE — Addendum Note (Signed)
Addended by: [name] on: 09/24/2016 04:29 PM   Modules accepted: Orders

## 2016-09-24 NOTE — Telephone Encounter (Signed)
PT came in today to drop off stool card. Orders were placed. She does not need lab appt tomorrow, I'm not sure why it is scheduled.

## 2016-09-24 NOTE — Telephone Encounter (Signed)
Patient notified and scheduled for lab. Please place order

## 2016-09-24 NOTE — Addendum Note (Signed)
Addended by: [name] on: 09/24/2016 12:37 PM   Modules accepted: Orders

## 2016-09-24 NOTE — Telephone Encounter (Signed)
Left message to call on voicemail  

## 2016-09-24 NOTE — Telephone Encounter (Signed)
Pt stated that missed a call from the office. Pt not sure of the reason  Pt contact [phone]

## 2016-09-24 NOTE — Telephone Encounter (Signed)
Pt brought stool card in today, could please place orders. Thank you.

## 2016-09-24 NOTE — Telephone Encounter (Signed)
Patient is coming in for hemoglobin lab, requested by Dr.Sonnenberg. I called patient back to inform her she does need this appointment. He will place order.

## 2016-09-24 NOTE — Telephone Encounter (Signed)
Please place lab orders for tomorrow. Thank you.

## 2016-09-25 ENCOUNTER — Other Ambulatory Visit (INDEPENDENT_AMBULATORY_CARE_PROVIDER_SITE_OTHER): Payer: Medicare Other

## 2016-09-25 ENCOUNTER — Telehealth: Payer: Self-pay | Admitting: *Deleted

## 2016-09-25 ENCOUNTER — Other Ambulatory Visit: Payer: Self-pay | Admitting: Radiology

## 2016-09-25 ENCOUNTER — Other Ambulatory Visit: Payer: Self-pay | Admitting: *Deleted

## 2016-09-25 DIAGNOSIS — D649 Anemia, unspecified: Secondary | ICD-10-CM

## 2016-09-25 DIAGNOSIS — K588 Other irritable bowel syndrome: Secondary | ICD-10-CM

## 2016-09-25 LAB — CBC
HCT: 31.4 % — ABNORMAL LOW (ref 36.0–46.0)
Hemoglobin: 10.5 g/dL — ABNORMAL LOW (ref 12.0–15.0)
MCHC: 33.6 g/dL (ref 30.0–36.0)
MCV: 90.8 fl (ref 78.0–100.0)
Platelets: 343 10*3/uL (ref 150.0–400.0)
RBC: 3.45 Mil/uL — ABNORMAL LOW (ref 3.87–5.11)
RDW: 13.9 % (ref 11.5–15.5)
WBC: 6.2 10*3/uL (ref 4.0–10.5)

## 2016-09-25 LAB — FECAL OCCULT BLOOD, IMMUNOCHEMICAL: Fecal Occult Bld: NEGATIVE

## 2016-09-25 MED ORDER — APIXABAN 5 MG PO TABS: 5.0000 mg | ORAL_TABLET | Freq: Two times a day (BID) | ORAL | 5 refills | Status: DC

## 2016-09-25 MED ORDER — CYCLOBENZAPRINE HCL 10 MG PO TABS: 5.0000 mg | ORAL_TABLET | Freq: Three times a day (TID) | ORAL | 0 refills | Status: DC | PRN

## 2016-09-25 MED ORDER — ONDANSETRON HCL 4 MG PO TABS: 4.0000 mg | ORAL_TABLET | Freq: Three times a day (TID) | ORAL | 0 refills | Status: DC | PRN

## 2016-09-25 NOTE — Telephone Encounter (Signed)
Last filled flexeril 09/16/16 30 tid prn Last filled eliquis 09/03/16 30 1rf I do not see zofran in her chart

## 2016-09-25 NOTE — Telephone Encounter (Signed)
Pt requested a medication refill Eliquis, Zofran and Flexeril  Pharmacy [facility] [address]

## 2016-09-25 NOTE — Telephone Encounter (Signed)
Sent to pharmacy 

## 2016-09-25 NOTE — Telephone Encounter (Signed)
Pt has concerns about the combinations of Lexapro and Eliquis, the pharmacist suggested that these two medications not be combined, due to the side effects of intestinal bleeding. Pt has high concern, due to her possibly having intestinal bleeding and being tested for this by her provider.  Please call pt to discuss medication changes [phone]

## 2016-09-26 LAB — IRON,TIBC AND FERRITIN PANEL
%SAT: 13 % (ref 11–50)
Ferritin: 28 ng/mL (ref 20–288)
Iron: 41 ug/dL — ABNORMAL LOW (ref 45–160)
TIBC: 317 ug/dL (ref 250–450)

## 2016-09-26 NOTE — Telephone Encounter (Signed)
Noted. I agree that she should discuss with her psychiatrist given that they are managing her Lexapro and psychiatric issues particularly given the recent events that required hospitalization and that she has been doing better since discharge from the hospital.

## 2016-09-26 NOTE — Telephone Encounter (Signed)
There certainly is risk of bleeding while taking both of these medications. She should not stop the Eliquis. She should not suddenly stop the Lexapro. Please see if she is seeing a psychiatrist as they could potentially recommend a medicine similar to Lexapro for her to be on. If she is not seeing a psychiatrist we could consider tapering her off of the Lexapro if she would like though this may cause her depression and anxiety worsen temporarily while she is coming down on this medication.

## 2016-09-26 NOTE — Telephone Encounter (Signed)
Left message to return call 

## 2016-09-26 NOTE — Telephone Encounter (Signed)
Patient is scheduled to see psychiatrist, called [facility] in [place]. She stated that she will continue medications taking these medications several hours apart. She will express her concerns to the psychiatrist.

## 2016-09-26 NOTE — Telephone Encounter (Signed)
Please advise 

## 2016-09-26 NOTE — Telephone Encounter (Signed)
Fyi.

## 2016-09-27 ENCOUNTER — Emergency Department: Payer: Medicare Other

## 2016-09-27 ENCOUNTER — Observation Stay
Admission: EM | Admit: 2016-09-27 | Discharge: 2016-09-29 | Disposition: A | Discharge status: Home / Self Care | Payer: Medicare Other | Attending: Internal Medicine | Admitting: Internal Medicine

## 2016-09-27 DIAGNOSIS — K589 Irritable bowel syndrome without diarrhea: Secondary | ICD-10-CM | POA: Insufficient documentation

## 2016-09-27 DIAGNOSIS — Z96642 Presence of left artificial hip joint: Secondary | ICD-10-CM | POA: Insufficient documentation

## 2016-09-27 DIAGNOSIS — R509 Fever, unspecified: Secondary | ICD-10-CM | POA: Diagnosis not present

## 2016-09-27 DIAGNOSIS — Z79899 Other long term (current) drug therapy: Secondary | ICD-10-CM | POA: Insufficient documentation

## 2016-09-27 DIAGNOSIS — E876 Hypokalemia: Secondary | ICD-10-CM | POA: Diagnosis not present

## 2016-09-27 DIAGNOSIS — Z86718 Personal history of other venous thrombosis and embolism: Secondary | ICD-10-CM | POA: Insufficient documentation

## 2016-09-27 DIAGNOSIS — I10 Essential (primary) hypertension: Secondary | ICD-10-CM | POA: Insufficient documentation

## 2016-09-27 DIAGNOSIS — R1084 Generalized abdominal pain: Secondary | ICD-10-CM | POA: Diagnosis not present

## 2016-09-27 DIAGNOSIS — I252 Old myocardial infarction: Secondary | ICD-10-CM | POA: Insufficient documentation

## 2016-09-27 DIAGNOSIS — Z7901 Long term (current) use of anticoagulants: Secondary | ICD-10-CM | POA: Insufficient documentation

## 2016-09-27 DIAGNOSIS — F329 Major depressive disorder, single episode, unspecified: Secondary | ICD-10-CM | POA: Insufficient documentation

## 2016-09-27 DIAGNOSIS — I2510 Atherosclerotic heart disease of native coronary artery without angina pectoris: Secondary | ICD-10-CM | POA: Diagnosis not present

## 2016-09-27 DIAGNOSIS — R079 Chest pain, unspecified: Secondary | ICD-10-CM | POA: Diagnosis not present

## 2016-09-27 DIAGNOSIS — E785 Hyperlipidemia, unspecified: Secondary | ICD-10-CM | POA: Diagnosis not present

## 2016-09-27 DIAGNOSIS — F251 Schizoaffective disorder, depressive type: Secondary | ICD-10-CM | POA: Insufficient documentation

## 2016-09-27 DIAGNOSIS — Z86711 Personal history of pulmonary embolism: Secondary | ICD-10-CM | POA: Diagnosis not present

## 2016-09-27 DIAGNOSIS — K219 Gastro-esophageal reflux disease without esophagitis: Secondary | ICD-10-CM | POA: Diagnosis not present

## 2016-09-27 DIAGNOSIS — E1159 Type 2 diabetes mellitus with other circulatory complications: Secondary | ICD-10-CM | POA: Diagnosis present

## 2016-09-27 DIAGNOSIS — R109 Unspecified abdominal pain: Secondary | ICD-10-CM | POA: Diagnosis not present

## 2016-09-27 DIAGNOSIS — Z87891 Personal history of nicotine dependence: Secondary | ICD-10-CM | POA: Diagnosis not present

## 2016-09-27 LAB — BASIC METABOLIC PANEL
Anion gap: 7 (ref 5–15)
BUN: 9 mg/dL (ref 6–20)
CO2: 26 mmol/L (ref 22–32)
Calcium: 9.1 mg/dL (ref 8.9–10.3)
Chloride: 105 mmol/L (ref 101–111)
Creatinine, Ser: 0.92 mg/dL (ref 0.44–1.00)
GFR calc Af Amer: >60 mL/min (ref >60)
GFR calc non Af Amer: >60 mL/min (ref >60)
Glucose, Bld: 145 mg/dL — ABNORMAL HIGH (ref 65–99)
Potassium: 3.4 mmol/L — ABNORMAL LOW (ref 3.5–5.1)
Sodium: 138 mmol/L (ref 135–145)

## 2016-09-27 LAB — URINALYSIS, COMPLETE (UACMP) WITH MICROSCOPIC
Bilirubin Urine: NEGATIVE
Glucose, UA: NEGATIVE mg/dL
Hgb urine dipstick: NEGATIVE
Ketones, ur: NEGATIVE mg/dL
Leukocytes, UA: NEGATIVE
Nitrite: NEGATIVE
Protein, ur: NEGATIVE mg/dL
Specific Gravity, Urine: 1.021 (ref 1.005–1.030)
pH: 5 (ref 5.0–8.0)

## 2016-09-27 LAB — CBC
HCT: 33.6 % — ABNORMAL LOW (ref 35.0–47.0)
Hemoglobin: 11.2 g/dL — ABNORMAL LOW (ref 12.0–16.0)
MCH: 30.2 pg (ref 26.0–34.0)
MCHC: 33.4 g/dL (ref 32.0–36.0)
MCV: 90.4 fL (ref 80.0–100.0)
Platelets: 346 10*3/uL (ref 150–440)
RBC: 3.72 MIL/uL — ABNORMAL LOW (ref 3.80–5.20)
RDW: 13.8 % (ref 11.5–14.5)
WBC: 9 10*3/uL (ref 3.6–11.0)

## 2016-09-27 LAB — TROPONIN I: Troponin I: <0.03 ng/mL (ref <0.03)

## 2016-09-27 NOTE — ED Notes (Signed)
Pt came in today with weakness x 3 days and intermittent chest pain today. States she has been staying at her neighbor's house babysitting. States that child has been throwing up and ill. Pt sitting comfortably in the room texting. Denies pain at this time.

## 2016-09-27 NOTE — ED Provider Notes (Addendum)
[facility] Emergency Department Provider Note  ____________________________________________   I have reviewed the triage vital signs and the nursing notes.   HISTORY  Chief Complaint Weakness and Chest Pain    HPI Valerie Vazquez is a 61 y.o. female who is well-known to the emergency department. She presents today complaining of feeling generally weak. When I talk to her, she states really she is here because she was around a "sick" who is vomiting and she is worried that she is going to get sick. She feels generally weak and she is worried that this might be because she is getting sick. She denies any focal symptoms however to me, she denies shortness of breath nausea or vomiting or diarrhea she denies abdominal pain she denies rash headache stiff neck fever or chills, she has no focal complaints she just says "I was around that tonight worried I'm going to get sick". I do not this is the patient's 18th visit to the emergency room in the last year.  ----------------------------------------- 12:50 AM on 09/28/2016 -----------------------------------------  [place], patient denied chest pain to me, but when asked her again after her troponin came back borderline, she states yes I have had chest pain, she's been having substantial chest pain radiating up towards her jaw and neck. She is not having any of that at this time.     Past Medical History:  Diagnosis Date  . Anemia   . Bilateral renal cysts   . Chronic back pain    "upper and lower" (09/19/2014)  . Depression    "major" (09/19/2014)  . DVT (deep venous thrombosis) ([place])   . Esophageal spasm   . Gallstones   . Gastritis   . Gastroenteritis   . GERD (gastroesophageal reflux disease)   . Headache    "couple /month lately" (09/19/2014)  . High blood pressure   . High cholesterol   . History of blood transfusion 1985   related to hysterectomy  . Hypertension   . IBS (irritable bowel  syndrome)   . Migraine    "a few times/yr" (09/19/2014)  . Myocardial infarction 10/2010 X 3   "while hospitalized"  . Osteoarthritis    "qwhere; mostly around my joints" (09/19/2014)  . Pneumonia 04/2014  . PTSD (post-traumatic stress disorder)   . Pulmonary embolism ([place]) 04/2001; 09/19/2014   "after gallbladder OR; "  . Schizoaffective disorder ([place])   . Sleep apnea    "mild" (09/19/2014)  . Snoring    a. sleep study 5/16: No OSA  . Spinal stenosis   . Spondylosis   . Type II diabetes mellitus ([place])    "dx'd in 2007; lost weight; no RX for ~ 4 yr now" (09/19/2014)    Patient Active Problem List   Diagnosis Date Noted  . Diarrhea 09/16/2016  . Epigastric discomfort 09/16/2016  . Schizoaffective disorder, depressive type ([place]) 08/28/2016  . Sinusitis, acute maxillary 08/27/2016  . Suicide attempt 08/27/2016  . Rash and nonspecific skin eruption 04/16/2016  . Colitis 04/08/2016  . Tremor 03/19/2016  . Left shoulder pain 02/18/2016  . Abdominal distension 02/18/2016  . Absolute anemia 01/29/2016  . Chronic pain 01/29/2016  . Neurogenic pain 01/29/2016  . Musculoskeletal pain 01/29/2016  . Long term current use of opiate analgesic 01/29/2016  . Long term prescription opiate use 01/29/2016  . Opiate use (15 MME/Day) 01/29/2016  . Encounter for therapeutic drug level monitoring 01/29/2016  . Encounter for pain management planning 01/29/2016  . Chronic neck pain (midline)  01/29/2016  . Chronic chest wall pain 01/29/2016  . Chronic low back pain (Location of Secondary source of pain) (Bilateral) (R>L) 01/29/2016  . Disturbance of skin sensation 01/29/2016  . Chronic shoulder pain (Location of Primary Source of Pain) (Left) 01/29/2016  . Chronic hip pain (Location of Tertiary source of pain) (Bilateral) (R>L) 01/29/2016  . Osteoarthritis of hip (Bilateral) 01/29/2016  . Lumbar facet arthropathy (Severe at L2-3, L3-4, L4-5, and L5-S1) 01/29/2016  . Lumbar central spinal stenosis  (moderate at L4-5; mild at L2-3 and L3-4) 01/29/2016  . Lumbar foraminal stenosis (L4-5) (Right) 01/29/2016  . Lumbar facet syndrome 01/29/2016  . Hx of cervical spine surgery 01/29/2016  . Numbness 11/19/2015  . Right wrist pain 11/09/2015  . Breast pain 10/19/2015  . Chest pain 10/14/2015  . Headache 10/14/2015  . Abdominal pain 10/14/2015  . Lip swelling 10/14/2015  . Pulmonary embolism ([place]) 08/15/2015  . Bilateral pneumonia 08/15/2015  . Nausea 08/15/2015  . Shortness of breath 07/31/2015  . Pleural effusion, bilateral 07/31/2015  . Cough 07/31/2015  . Chronic pain of multiple sites 06/05/2015  . Chronic lumbosacral pain 06/05/2015  . Esophageal spasm 06/05/2015  . History of DVT (deep vein thrombosis) 02/25/2015  . 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 Constipation 12/12/2014  . Coronary vasospasm ([place]) 12/09/2014  . History of pulmonary embolus (PE) 09/19/2014  . Impingement syndrome of shoulder 08/30/2014  . Impingement syndrome of left shoulder 08/30/2014  . Injury of superior glenoid labrum of shoulder joint 08/16/2014  . Supraspinatus tenosynovitis 08/16/2014  . Cervical spondylosis without myelopathy 08/16/2014  . Anxiety and depression 04/13/2014  . Osteoarthritis of hip (Left) 04/11/2014  . S/P THR (total hip replacement) (Left) 04/11/2014  . IBS (irritable bowel syndrome) 04/11/2014  . GERD (gastroesophageal reflux disease) 04/11/2014  . Insomnia 04/11/2014  . Essential hypertension, benign 10/28/2013  . Left hip pain 10/28/2013  . Anemia 10/28/2013  . HLD (hyperlipidemia) 10/28/2013    Past Surgical History:  Procedure Laterality Date  . ABDOMINAL HYSTERECTOMY  1985  . ANTERIOR CERVICAL DECOMP/DISCECTOMY FUSION  10/2012  . APPENDECTOMY  1985  . BACK SURGERY    . BREAST CYST EXCISION Right   . BREAST EXCISIONAL BIOPSY Right YRS AGO   NEG  . CARDIAC CATHETERIZATION   2000's; 2009  . CHOLECYSTECTOMY  2002  . EXCISION/RELEASE BURSA HIP Right 12/1984  . HERNIA  REPAIR  1985  . LEFT HEART CATHETERIZATION WITH CORONARY ANGIOGRAM N/A 12/09/2014   Procedure: LEFT HEART CATHETERIZATION WITH CORONARY ANGIOGRAM;  Surgeon: Burnell Blanks, MD;  Location: [facility] CATH LAB;  Service: Cardiovascular;  Laterality: N/A;  . TONSILLECTOMY AND ADENOIDECTOMY  ~ 1968  . TOTAL HIP ARTHROPLASTY Left 04-06-2014  . UMBILICAL HERNIA REPAIR  1985  . VENA CAVA FILTER PLACEMENT  03/2014    Prior to Admission medications   Medication Sig Start Date End Date Taking? Authorizing Provider  albuterol (PROVENTIL HFA;VENTOLIN HFA) 108 (90 Base) MCG/ACT inhaler Inhale 2 puffs into the lungs every 6 (six) hours as needed for wheezing or shortness of breath. 09/03/16  Yes Jolanta B Pucilowska, MD  apixaban (ELIQUIS) 5 MG TABS tablet Take 1 tablet (5 mg total) by mouth 2 (two) times daily. 09/25/16  Yes [name], MD  cyclobenzaprine (FLEXERIL) 10 MG tablet Take 0.5 tablets (5 mg total) by mouth 3 (three) times daily as needed for muscle spasms. 09/25/16  Yes [name], MD  dicyclomine (BENTYL) 20 MG tablet Take 1 tablet (20 mg total) by mouth 3 (three)  times daily as needed for spasms. 09/16/16 09/16/17 Yes [name], MD  diltiazem (CARDIZEM SR) 60 MG 12 hr capsule Take 1 capsule (60 mg total) by mouth every 12 (twelve) hours. 09/03/16  Yes Jolanta B Pucilowska, MD  famotidine (PEPCID) 20 MG tablet Take 1 tablet (20 mg total) by mouth 2 (two) times daily. 09/12/16  Yes Jola Schmidt, MD  furosemide (LASIX) 20 MG tablet Take 1 tablet (20 mg total) by mouth daily. 09/04/16  Yes Jolanta B Pucilowska, MD  gabapentin (NEURONTIN) 600 MG tablet Take 0.5 tablets (300 mg total) by mouth 3 (three) times daily. 09/16/16  Yes [name], MD  isosorbide mononitrate (IMDUR) 30 MG 24 hr tablet Take 1 tablet (30 mg total) by mouth daily. 09/16/16  Yes [name], MD  OLANZapine (ZYPREXA) 10 MG tablet Take 1 tablet (10 mg total) by mouth at bedtime. 09/03/16  Yes Jolanta B Pucilowska,  MD  ondansetron (ZOFRAN) 4 MG tablet Take 1 tablet (4 mg total) by mouth every 8 (eight) hours as needed for nausea or vomiting. 09/25/16  Yes [name], MD  pantoprazole (PROTONIX) 40 MG tablet Take 1 tablet (40 mg total) by mouth daily. 09/04/16  Yes Jolanta B Pucilowska, MD  potassium chloride SA (K-DUR,KLOR-CON) 10 MEQ tablet Take 1 tablet (10 mEq total) by mouth daily. 09/04/16  Yes Jolanta B Pucilowska, MD  traZODone (DESYREL) 100 MG tablet Take 200 mg by mouth at bedtime as needed for sleep.   Yes Historical Provider, MD  triamcinolone cream (KENALOG) 0.1 % Apply 1 application topically 2 (two) times daily. 09/13/16  Yes Kayla Rose, PA-C  carvedilol (COREG) 3.125 MG tablet Take 1 tablet (3.125 mg total) by mouth 2 (two) times daily with a meal. 09/03/16   Jolanta B Pucilowska, MD  diphenhydrAMINE (BENADRYL) 25 MG tablet Take 1 tablet (25 mg total) by mouth every 6 (six) hours. 09/12/16   Jola Schmidt, MD  escitalopram (LEXAPRO) 20 MG tablet Take 1 tablet (20 mg total) by mouth daily. Patient taking differently: Take 15 mg by mouth daily.  09/04/16   Clovis Fredrickson, MD    Allergies Abilify [aripiprazole]; Augmentin [amoxicillin-pot clavulanate]; Bactrim [sulfamethoxazole-trimethoprim]; Ceftin [cefuroxime axetil]; Coconut oil; Diclofenac; Dye fdc red [red dye]; Indocin [indomethacin]; Linaclotide; Lisinopril; Morphine; Morphine and related; Simvastatin; Sulfa antibiotics; Sulfamethoxazole-trimethoprim; Wellbutrin [bupropion]; and Quetiapine fumarate  Family History  Problem Relation Age of Onset  . Stroke Mother     Deceased  . Lung cancer Father     Deceased  . Healthy Daughter   . Healthy Son     x 2  . Other Son     Suicide  . Heart attack Neg Hx     Social History Social History  Substance Use Topics  . Smoking status: Former Smoker    Packs/day: 1.50    Years: 10.00    Types: Cigarettes    Quit date: 04/12/1979  . Smokeless tobacco: Never Used  . Alcohol use No     Review of Systems Constitutional: No fever/chills Eyes: No visual changes. ENT: No sore throat. No stiff neck no neck pain Cardiovascular: Denies chest pain. Respiratory: Denies shortness of breath. Gastrointestinal:   no vomiting.  No diarrhea.  No constipation. Genitourinary: Negative for dysuria. Musculoskeletal: Negative lower extremity swelling Skin: Negative for rash. Neurological: Negative for severe headaches, focal weakness or numbness. 10-point ROS otherwise negative.  ____________________________________________   PHYSICAL EXAM:  VITAL SIGNS: ED Triage Vitals [09/27/16 1943]  Enc Vitals Group  BP 138/71     Pulse Rate 88     Resp 18     Temp 98.7 F (37.1 C)     Temp Source Oral     SpO2 98 %     Weight 195 lb (88.5 kg)     Height 5\' 6"  (1.676 m)     Head Circumference      Peak Flow      Pain Score 5     Pain Loc      Pain Edu?      Excl. in [place]?     Constitutional: Alert and oriented. Well appearing and in no acute distress. Eyes: Conjunctivae are normal. PERRL. EOMI. Head: Atraumatic. Nose: No congestion/rhinnorhea. Mouth/Throat: Mucous membranes are moist.  Oropharynx non-erythematous. Neck: No stridor.   Nontender with no meningismus Cardiovascular: Normal rate, regular rhythm. Grossly normal heart sounds.  Good peripheral circulation. Respiratory: Normal respiratory effort.  No retractions. Lungs CTAB. Abdominal: Soft and nontender. No distention. No guarding no rebound Back:  There is no focal tenderness or step off.  there is no midline tenderness there are no lesions noted. there is no CVA tenderness  Musculoskeletal: No lower extremity tenderness, no upper extremity tenderness. No joint effusions, no DVT signs strong distal pulses no edema Neurologic:  Normal speech and language. No gross focal neurologic deficits are appreciated.  Skin:  Skin is warm, dry and intact. No rash noted. Psychiatric: Mood and affect are normal. Speech and  behavior are normal.  ____________________________________________   LABS (all labs ordered are listed, but only abnormal results are displayed)  Labs Reviewed  BASIC METABOLIC PANEL - Abnormal; Notable for the following:       Result Value   Potassium 3.4 (*)    Glucose, Bld 145 (*)    All other components within normal limits  CBC - Abnormal; Notable for the following:    RBC 3.72 (*)    Hemoglobin 11.2 (*)    HCT 33.6 (*)    All other components within normal limits  URINALYSIS, COMPLETE (UACMP) WITH MICROSCOPIC - Abnormal; Notable for the following:    Color, Urine YELLOW (*)    APPearance CLEAR (*)    Bacteria, UA RARE (*)    Squamous Epithelial / LPF 0-5 (*)    All other components within normal limits  TROPONIN I  TROPONIN I   ____________________________________________  EKG  I personally interpreted any EKGs ordered by me or triage    Sinus rhythm rate 81 bpm no acute ST elevation or depression, nonspecific ST changes, flipped T waves noted in 3 and aVF seen on several prior EKGs including 08/15/2016. ____________________________________________  RADIOLOGY  I reviewed any imaging ordered by me or triage that were performed during my shift and, if possible, patient and/or family made aware of any abnormal findings. ____________________________________________   PROCEDURES  Procedure(s) performed: None  Procedures  Critical Care performed: None  ____________________________________________   INITIAL IMPRESSION / ASSESSMENT AND PLAN / ED COURSE  Pertinent labs & imaging results that were available during my care of the patient were reviewed by me and considered in my medical decision making (see chart for details).  Patient here essentially with anxiety about getting sick it appears. She does not appear to be acutely ill at this time. Given her extensive history we are checking a second cardiac enzyme. She does have some EKG changes which appear to be  to my read consistent with prior. No evidence of ACS PE or dissection.  Nothing to suggest pneumonia and pneumonitis or pneumothorax or any other acute intrathoracic pathology. Her abdomen is completely benign and she has no abdominal complaint. She has no evidence of significant urinary tract infection. Blood work is reassuring vital signs are reassuring, as mentioned patient has been here 18 times in the last 12 months for various complaints most of which have not resulted in admission. I do note that at this time the patient's primary preoccupation is with discharge as she is worried she will catch the fluid while she is here. I don't think this is an unreasonable concern and if her second troponin is negative I think we can oblige her in this. Extensive return precautions and follow-up however given and understood and the patient will remain very vigilant about her health.    ----------------------------------------- 12:51 AM on 09/28/2016 -----------------------------------------  Patient with a second troponin which indicates some possible ischemia. We will admit her to the hospital for further evaluation.  Agent has no allergy to aspirin after discussion, we will give her aspirin pending admission. ____________________________________________   FINAL CLINICAL IMPRESSION(S) / ED DIAGNOSES  Final diagnoses:  None      This chart was dictated using voice recognition software.  Despite best efforts to proofread,  errors can occur which can change meaning.      Schuyler Amor, MD 09/27/16 [place], MD 09/28/16 [place], MD 09/28/16 [phone]

## 2016-09-27 NOTE — ED Notes (Signed)
Repeat troponin ordered for 2300 per v.o dr. Marcelene Butte

## 2016-09-27 NOTE — ED Triage Notes (Signed)
Pt reports general weakness since Wednesday, with light activity around her house; pt reports history of anemia and is being followed by her MD; reports mild headache intermittently for days; intermittent chest pain to center of chest; last for "a couple of minutes and goes away on it's own"; adds shortness of breath; pt very chatty in triage with complete sentences

## 2016-09-28 ENCOUNTER — Observation Stay
Admit: 2016-09-28 | Discharge: 2016-09-28 | Disposition: A | Discharge status: Home / Self Care | Payer: Medicare Other | Attending: Internal Medicine | Admitting: Internal Medicine

## 2016-09-28 DIAGNOSIS — R079 Chest pain, unspecified: Secondary | ICD-10-CM | POA: Diagnosis not present

## 2016-09-28 DIAGNOSIS — E785 Hyperlipidemia, unspecified: Secondary | ICD-10-CM | POA: Diagnosis not present

## 2016-09-28 DIAGNOSIS — K219 Gastro-esophageal reflux disease without esophagitis: Secondary | ICD-10-CM | POA: Diagnosis not present

## 2016-09-28 DIAGNOSIS — I2510 Atherosclerotic heart disease of native coronary artery without angina pectoris: Secondary | ICD-10-CM

## 2016-09-28 HISTORY — DX: Atherosclerotic heart disease of native coronary artery without angina pectoris: I25.10

## 2016-09-28 LAB — BASIC METABOLIC PANEL
Anion gap: 6 (ref 5–15)
BUN: 11 mg/dL (ref 6–20)
CO2: 28 mmol/L (ref 22–32)
Calcium: 8.5 mg/dL — ABNORMAL LOW (ref 8.9–10.3)
Chloride: 105 mmol/L (ref 101–111)
Creatinine, Ser: 0.75 mg/dL (ref 0.44–1.00)
GFR calc Af Amer: >60 mL/min (ref >60)
GFR calc non Af Amer: >60 mL/min (ref >60)
Glucose, Bld: 109 mg/dL — ABNORMAL HIGH (ref 65–99)
Potassium: 3.4 mmol/L — ABNORMAL LOW (ref 3.5–5.1)
Sodium: 139 mmol/L (ref 135–145)

## 2016-09-28 LAB — CBC
HCT: 29.8 % — ABNORMAL LOW (ref 35.0–47.0)
Hemoglobin: 10.7 g/dL — ABNORMAL LOW (ref 12.0–16.0)
MCH: 31.6 pg (ref 26.0–34.0)
MCHC: 35.9 g/dL (ref 32.0–36.0)
MCV: 88 fL (ref 80.0–100.0)
Platelets: 318 10*3/uL (ref 150–440)
RBC: 3.39 MIL/uL — ABNORMAL LOW (ref 3.80–5.20)
RDW: 13.5 % (ref 11.5–14.5)
WBC: 7.9 10*3/uL (ref 3.6–11.0)

## 2016-09-28 LAB — TROPONIN I
Troponin I: 0.06 ng/mL (ref <0.03)
Troponin I: <0.03 ng/mL (ref <0.03)
Troponin I: <0.03 ng/mL (ref <0.03)
Troponin I: <0.03 ng/mL (ref <0.03)

## 2016-09-28 LAB — ECHOCARDIOGRAM COMPLETE
Height: 66 in
Weight: 3096 oz

## 2016-09-28 MED ORDER — DILTIAZEM HCL ER 60 MG PO CP12
60.0000 mg | ORAL_CAPSULE | Freq: Two times a day (BID) | ORAL | Status: DC
  Administered 2016-09-28 – 2016-09-29 (×3): 60 mg via ORAL
  Filled 2016-09-28 (×4): qty 1

## 2016-09-28 MED ORDER — ASPIRIN 81 MG PO CHEW
324.0000 mg | CHEWABLE_TABLET | Freq: Once | ORAL | Status: AC
Start: 2016-09-28 — End: 2016-09-28
  Administered 2016-09-28: 324 mg via ORAL
  Filled 2016-09-28: qty 4

## 2016-09-28 MED ORDER — SODIUM CHLORIDE 0.9% FLUSH
3.0000 mL | Freq: Two times a day (BID) | INTRAVENOUS | Status: DC
  Administered 2016-09-28 – 2016-09-29 (×4): 3 mL via INTRAVENOUS

## 2016-09-28 MED ORDER — OXYCODONE-ACETAMINOPHEN 5-325 MG PO TABS
1.0000 | ORAL_TABLET | Freq: Four times a day (QID) | ORAL | Status: DC | PRN
  Administered 2016-09-28 – 2016-09-29 (×3): 2 via ORAL
  Filled 2016-09-28 (×2): qty 1
  Filled 2016-09-28 (×2): qty 2

## 2016-09-28 MED ORDER — ONDANSETRON HCL 4 MG/2ML IJ SOLN: 4.0000 mg | Freq: Four times a day (QID) | INTRAMUSCULAR | Status: DC | PRN

## 2016-09-28 MED ORDER — PANTOPRAZOLE SODIUM 40 MG PO TBEC
40.0000 mg | DELAYED_RELEASE_TABLET | Freq: Every day | ORAL | Status: DC
  Administered 2016-09-28 – 2016-09-29 (×2): 40 mg via ORAL
  Filled 2016-09-28 (×2): qty 1

## 2016-09-28 MED ORDER — TRAZODONE HCL 100 MG PO TABS
200.0000 mg | ORAL_TABLET | Freq: Every evening | ORAL | Status: DC | PRN
  Administered 2016-09-28: 200 mg via ORAL
  Filled 2016-09-28: qty 2

## 2016-09-28 MED ORDER — APIXABAN 5 MG PO TABS
5.0000 mg | ORAL_TABLET | Freq: Two times a day (BID) | ORAL | Status: DC
  Administered 2016-09-28 – 2016-09-29 (×4): 5 mg via ORAL
  Filled 2016-09-28 (×4): qty 1

## 2016-09-28 MED ORDER — OLANZAPINE 10 MG PO TABS
10.0000 mg | ORAL_TABLET | Freq: Every day | ORAL | Status: DC
  Administered 2016-09-28: 10 mg via ORAL
  Filled 2016-09-28 (×2): qty 1

## 2016-09-28 MED ORDER — ACETAMINOPHEN 650 MG RE SUPP: 650.0000 mg | Freq: Four times a day (QID) | RECTAL | Status: DC | PRN

## 2016-09-28 MED ORDER — ACETAMINOPHEN 325 MG PO TABS
650.0000 mg | ORAL_TABLET | Freq: Four times a day (QID) | ORAL | Status: DC | PRN
  Administered 2016-09-28: 650 mg via ORAL
  Filled 2016-09-28: qty 2

## 2016-09-28 MED ORDER — ESCITALOPRAM OXALATE 10 MG PO TABS
15.0000 mg | ORAL_TABLET | Freq: Every day | ORAL | Status: DC
  Administered 2016-09-28 – 2016-09-29 (×2): 15 mg via ORAL
  Filled 2016-09-28 (×2): qty 2

## 2016-09-28 MED ORDER — CARVEDILOL 3.125 MG PO TABS
3.1250 mg | ORAL_TABLET | Freq: Two times a day (BID) | ORAL | Status: DC
  Administered 2016-09-28 – 2016-09-29 (×3): 3.125 mg via ORAL
  Filled 2016-09-28 (×3): qty 1

## 2016-09-28 MED ORDER — DIPHENHYDRAMINE HCL 25 MG PO CAPS
25.0000 mg | ORAL_CAPSULE | Freq: Four times a day (QID) | ORAL | Status: DC
  Administered 2016-09-28 – 2016-09-29 (×5): 25 mg via ORAL
  Filled 2016-09-28 (×6): qty 1

## 2016-09-28 MED ORDER — GABAPENTIN 300 MG PO CAPS
300.0000 mg | ORAL_CAPSULE | Freq: Three times a day (TID) | ORAL | Status: DC
  Administered 2016-09-28 – 2016-09-29 (×4): 300 mg via ORAL
  Filled 2016-09-28 (×4): qty 1

## 2016-09-28 MED ORDER — ONDANSETRON HCL 4 MG PO TABS
4.0000 mg | ORAL_TABLET | Freq: Four times a day (QID) | ORAL | Status: DC | PRN
  Administered 2016-09-29: 4 mg via ORAL
  Filled 2016-09-28: qty 1

## 2016-09-28 MED ORDER — ISOSORBIDE MONONITRATE ER 30 MG PO TB24
30.0000 mg | ORAL_TABLET | Freq: Every day | ORAL | Status: DC
  Administered 2016-09-28 – 2016-09-29 (×2): 30 mg via ORAL
  Filled 2016-09-28 (×2): qty 1

## 2016-09-28 NOTE — Progress Notes (Signed)
Pt refusing bed/chair alarm/ encouraged to continue to ask for assistance before getting out of bed/ call bell within reach

## 2016-09-28 NOTE — H&P (Signed)
[facility] NAME: Valerie Vazquez    MR#:  [id number]  DATE OF BIRTH:  1956-04-20  DATE OF ADMISSION:  09/27/2016  PRIMARY CARE PHYSICIAN: [name], MD   REQUESTING/REFERRING PHYSICIAN: Burlene Arnt, MD  CHIEF COMPLAINT:   Chief Complaint  Patient presents with  . Weakness  . Chest Pain    HISTORY OF PRESENT ILLNESS:  Valerie Vazquez  is a 61 y.o. female who presents with chest pain.  Patient is unable to give a very clear history, but statesThat she has had intermittent chest pain throughout the day today with radiation to her jaw. She states that she has chronic back pain and has been diagnosed with coronary vasospasm in the past. She states she has had a heart attack in the past, but has never been officially diagnosed with blockages in the arteries on her heart even after catheterization. Her initial troponin the ED was normal, but the second one was 0.06. Hospitalists were called for admission  PAST MEDICAL HISTORY:   Past Medical History:  Diagnosis Date  . Anemia   . Bilateral renal cysts   . Chronic back pain    "upper and lower" (09/19/2014)  . Depression    "major" (09/19/2014)  . DVT (deep venous thrombosis) ([place])   . Esophageal spasm   . Gallstones   . Gastritis   . Gastroenteritis   . GERD (gastroesophageal reflux disease)   . Headache    "couple /month lately" (09/19/2014)  . High blood pressure   . High cholesterol   . History of blood transfusion 1985   related to hysterectomy  . Hypertension   . IBS (irritable bowel syndrome)   . Migraine    "a few times/yr" (09/19/2014)  . Myocardial infarction 10/2010 X 3   "while hospitalized"  . Osteoarthritis    "qwhere; mostly around my joints" (09/19/2014)  . Pneumonia 04/2014  . PTSD (post-traumatic stress disorder)   . Pulmonary embolism ([place]) 04/2001; 09/19/2014   "after gallbladder OR; "  . Schizoaffective disorder ([place])   . Sleep apnea    "mild"  (09/19/2014)  . Snoring    a. sleep study 5/16: No OSA  . Spinal stenosis   . Spondylosis   . Type II diabetes mellitus ([place])    "dx'd in 2007; lost weight; no RX for ~ 4 yr now" (09/19/2014)    PAST SURGICAL HISTORY:   Past Surgical History:  Procedure Laterality Date  . ABDOMINAL HYSTERECTOMY  1985  . ANTERIOR CERVICAL DECOMP/DISCECTOMY FUSION  10/2012  . APPENDECTOMY  1985  . BACK SURGERY    . BREAST CYST EXCISION Right   . BREAST EXCISIONAL BIOPSY Right YRS AGO   NEG  . CARDIAC CATHETERIZATION   2000's; 2009  . CHOLECYSTECTOMY  2002  . EXCISION/RELEASE BURSA HIP Right 12/1984  . HERNIA REPAIR  1985  . LEFT HEART CATHETERIZATION WITH CORONARY ANGIOGRAM N/A 12/09/2014   Procedure: LEFT HEART CATHETERIZATION WITH CORONARY ANGIOGRAM;  Surgeon: Burnell Blanks, MD;  Location: [facility] C[facility] LAB;  Service: Cardiovascular;  Laterality: N/A;  . TONSILLECTOMY AND ADENOIDECTOMY  ~ 1968  . TOTAL HIP ARTHROPLASTY Left 04-06-2014  . UMBILICAL HERNIA REPAIR  1985  . VENA CAVA FILTER PLACEMENT  03/2014    SOCIAL HISTORY:   Social History  Substance Use Topics  . Smoking status: Former Smoker    Packs/day: 1.50    Years: 10.00    Types: Cigarettes    Quit date:  04/12/1979  . Smokeless tobacco: Never Used  . Alcohol use No    FAMILY HISTORY:   Family History  Problem Relation Age of Onset  . Stroke Mother     Deceased  . Lung cancer Father     Deceased  . Healthy Daughter   . Healthy Son     x 2  . Other Son     Suicide  . Heart attack Neg Hx     DRUG ALLERGIES:   Allergies  Allergen Reactions  . Abilify [Aripiprazole] Other (See Comments)    Jerking movement, slow motor skills  . Augmentin [Amoxicillin-Pot Clavulanate] Diarrhea and Nausea And Vomiting  . Bactrim [Sulfamethoxazole-Trimethoprim] Diarrhea  . Ceftin [Cefuroxime Axetil] Diarrhea  . Coconut Oil     Rash   . Diclofenac Other (See Comments)     Muscle Pain  . Dye Fdc Red [Red Dye]     "hair dye"  .  Indocin [Indomethacin] Other (See Comments)    Migraines   . Linaclotide Diarrhea  . Lisinopril Diarrhea and Other (See Comments)    Other reaction(s): Dizziness, Vomiting  . Morphine Other (See Comments)    Chest Tightness  . Morphine And Related Other (See Comments)    Chest tightness   . Simvastatin Other (See Comments)    Muscle Pain  . Sulfa Antibiotics Diarrhea  . Sulfamethoxazole-Trimethoprim Diarrhea  . Wellbutrin [Bupropion] Other (See Comments)    Jerking movement Slurred speech  . Quetiapine Fumarate Palpitations    MEDICATIONS AT HOME:   Prior to Admission medications   Medication Sig Start Date End Date Taking? Authorizing Provider  albuterol (PROVENTIL HFA;VENTOLIN HFA) 108 (90 Base) MCG/ACT inhaler Inhale 2 puffs into the lungs every 6 (six) hours as needed for wheezing or shortness of breath. 09/03/16  Yes Jolanta B Pucilowska, MD  apixaban (ELIQUIS) 5 MG TABS tablet Take 1 tablet (5 mg total) by mouth 2 (two) times daily. 09/25/16  Yes [name], MD  cyclobenzaprine (FLEXERIL) 10 MG tablet Take 0.5 tablets (5 mg total) by mouth 3 (three) times daily as needed for muscle spasms. 09/25/16  Yes [name], MD  dicyclomine (BENTYL) 20 MG tablet Take 1 tablet (20 mg total) by mouth 3 (three) times daily as needed for spasms. 09/16/16 09/16/17 Yes [name], MD  diltiazem (CARDIZEM SR) 60 MG 12 hr capsule Take 1 capsule (60 mg total) by mouth every 12 (twelve) hours. 09/03/16  Yes Jolanta B Pucilowska, MD  famotidine (PEPCID) 20 MG tablet Take 1 tablet (20 mg total) by mouth 2 (two) times daily. 09/12/16  Yes Jola Schmidt, MD  furosemide (LASIX) 20 MG tablet Take 1 tablet (20 mg total) by mouth daily. 09/04/16  Yes Jolanta B Pucilowska, MD  gabapentin (NEURONTIN) 600 MG tablet Take 0.5 tablets (300 mg total) by mouth 3 (three) times daily. 09/16/16  Yes [name], MD  isosorbide mononitrate (IMDUR) 30 MG 24 hr tablet Take 1 tablet (30 mg total) by mouth  daily. 09/16/16  Yes [name], MD  OLANZapine (ZYPREXA) 10 MG tablet Take 1 tablet (10 mg total) by mouth at bedtime. 09/03/16  Yes Jolanta B Pucilowska, MD  ondansetron (ZOFRAN) 4 MG tablet Take 1 tablet (4 mg total) by mouth every 8 (eight) hours as needed for nausea or vomiting. 09/25/16  Yes [name], MD  pantoprazole (PROTONIX) 40 MG tablet Take 1 tablet (40 mg total) by mouth daily. 09/04/16  Yes Clovis Fredrickson, MD  potassium chloride SA (K-DUR,KLOR-CON) 10 MEQ tablet Take 1 tablet (10 mEq total) by mouth daily. 09/04/16  Yes Jolanta B Pucilowska, MD  traZODone (DESYREL) 100 MG tablet Take 200 mg by mouth at bedtime as needed for sleep.   Yes Historical Provider, MD  triamcinolone cream (KENALOG) 0.1 % Apply 1 application topically 2 (two) times daily. 09/13/16  Yes Kayla Rose, PA-C  carvedilol (COREG) 3.125 MG tablet Take 1 tablet (3.125 mg total) by mouth 2 (two) times daily with a meal. 09/03/16   Jolanta B Pucilowska, MD  diphenhydrAMINE (BENADRYL) 25 MG tablet Take 1 tablet (25 mg total) by mouth every 6 (six) hours. 09/12/16   Jola Schmidt, MD  escitalopram (LEXAPRO) 20 MG tablet Take 1 tablet (20 mg total) by mouth daily. Patient taking differently: Take 15 mg by mouth daily.  09/04/16   Clovis Fredrickson, MD    REVIEW OF SYSTEMS:  Review of Systems  Constitutional: Negative for chills, fever, malaise/fatigue and weight loss.  HENT: Negative for ear pain, hearing loss and tinnitus.   Eyes: Negative for blurred vision, double vision, pain and redness.  Respiratory: Negative for cough, hemoptysis and shortness of breath.   Cardiovascular: Positive for chest pain. Negative for palpitations, orthopnea and leg swelling.  Gastrointestinal: Negative for abdominal pain, constipation, diarrhea, nausea and vomiting.  Genitourinary: Negative for dysuria, frequency and hematuria.  Musculoskeletal: Negative for back pain, joint pain and neck pain.  Skin:       No acne, rash, or  lesions  Neurological: Negative for dizziness, tremors, focal weakness and weakness.  Endo/Heme/Allergies: Negative for polydipsia. Does not bruise/bleed easily.  Psychiatric/Behavioral: Negative for depression. The patient is not nervous/anxious and does not have insomnia.      VITAL SIGNS:   Vitals:   09/27/16 1943 09/27/16 2200 09/27/16 2300  BP: 138/71 124/70 132/76  Pulse: 88 78 80  Resp: 18    Temp: 98.7 F (37.1 C)    TempSrc: Oral    SpO2: 98% 98% 99%  Weight: 88.5 kg (195 lb)    Height: 5\' 6"  (1.676 m)     Wt Readings from Last 3 Encounters:  09/27/16 88.5 kg (195 lb)  09/13/16 88.9 kg (196 lb)  04/05/16 94.3 kg (208 lb)    PHYSICAL EXAMINATION:  Physical Exam  Vitals reviewed. Constitutional: She is oriented to person, place, and time. She appears well-developed and well-nourished. No distress.  HENT:  Head: Normocephalic and atraumatic.  Mouth/Throat: Oropharynx is clear and moist.  Eyes: Conjunctivae and EOM are normal. Pupils are equal, round, and reactive to light. No scleral icterus.  Neck: Normal range of motion. Neck supple. No JVD present. No thyromegaly present.  Cardiovascular: Normal rate, regular rhythm and intact distal pulses.  Exam reveals no gallop and no friction rub.   No murmur heard. Respiratory: Effort normal and breath sounds normal. No respiratory distress. She has no wheezes. She has no rales.  GI: Soft. Bowel sounds are normal. She exhibits no distension. There is no tenderness.  Musculoskeletal: Normal range of motion. She exhibits no edema.  No arthritis, no gout  Lymphadenopathy:    She has no cervical adenopathy.  Neurological: She is alert and oriented to person, place, and time. No cranial nerve deficit.  No dysarthria, no aphasia  Skin: Skin is warm and dry. No rash noted. No erythema.  Psychiatric: She has a normal mood and affect. Her behavior is normal. Judgment and thought content normal.    LABORATORY PANEL:  CBC  Recent Labs Lab 09/27/16 1954  WBC 9.0  HGB 11.2*  HCT 33.6*  PLT 346   ------------------------------------------------------------------------------------------------------------------  Chemistries   Recent Labs Lab 09/27/16 1954  NA 138  K 3.4*  CL 105  CO2 26  GLUCOSE 145*  BUN 9  CREATININE 0.92  CALCIUM 9.1   ------------------------------------------------------------------------------------------------------------------  Cardiac Enzymes  Recent Labs Lab 09/27/16 2333  TROPONINI 0.06*   ------------------------------------------------------------------------------------------------------------------  RADIOLOGY:  Dg Chest 2 View  Result Date: 09/27/2016 CLINICAL DATA:  Patient with sternal chest pain. EXAM: CHEST  2 VIEW COMPARISON:  Chest radiograph 08/30/2016. FINDINGS: Stable cardiac and mediastinal contours. No consolidative pulmonary opacities. No pleural effusion or pneumothorax. Thoracic spine degenerative changes. Cholecystectomy clips. IVC filter. IMPRESSION: No active cardiopulmonary disease. Electronically Signed   By: Lovey Newcomer M.D.   On: 09/27/2016 20:41    EKG:   Orders placed or performed during the hospital encounter of 09/27/16  . ED EKG  . ED EKG  . EKG 12-Lead  . EKG 12-Lead    IMPRESSION AND PLAN:  Principal Problem:   Chest pain - currently chest pain-free. Admit with serial troponins, echocardiogram and cardiology consult. Active Problems:   CAD (coronary artery disease) - continue home meds   Essential hypertension, benign - stable, continue home meds   Schizoaffective disorder, depressive type ([place]) - home dose psychiatric meds   HLD (hyperlipidemia) - home dose anti-lipids   GERD (gastroesophageal reflux disease) - home dose PPI  All the records are reviewed and case discussed with ED provider. Management plans discussed with the patient and/or family.  DVT PROPHYLAXIS: Systemic anticoagulation  GI  PROPHYLAXIS: PPI  ADMISSION STATUS: Observation  CODE STATUS: Full Code Status History    Date Active Date Inactive Code Status Order ID Comments User Context   08/28/2016  4:51 PM 09/04/2016  8:08 PM Full Code [id number]  Gonzella Lex, MD Inpatient   12/09/2014  5:24 PM 12/12/2014  5:08 PM Full Code [id number]  Burnell Blanks, MD Inpatient   09/19/2014  8:44 PM 09/24/2014  6:36 PM Full Code [id number]  Allyne Gee, MD Inpatient      TOTAL TIME TAKING CARE OF THIS PATIENT: 40 minutes.    Beronica [place] 09/28/2016, 1:44 AM  [facility]  Office  [phone]  CC: Primary care physician; [name], MD

## 2016-09-28 NOTE — ED Notes (Signed)
ED Provider at bedside. 

## 2016-09-28 NOTE — Progress Notes (Signed)
*  PRELIMINARY RESULTS* Echocardiogram 2D Echocardiogram has been performed.  Valerie Vazquez 09/28/2016, 11:32 AM

## 2016-09-28 NOTE — ED Notes (Signed)
Admitting md at bedside

## 2016-09-28 NOTE — Consult Note (Signed)
[facility]  CARDIOLOGY CONSULT NOTE  Patient ID: Valerie Vazquez MRN: [id number] DOB/AGE: 1956-05-16 61 y.o.  Admit date: 09/27/2016 Referring Physician   Primary Physician   Primary Cardiologist Dr. Nehemiah Massed Reason for Consultation chest pain  HPI: Pt is a 61 yo female with history of hypertension and hyperlipidemia who pesented to er with complaints of chest pain and weakness. She has ruled out for an mi. Echo showed preserved lv function with no significant valvular disease. Has history of multiple episodes of pe/dvt in the past treated with a ivc filter and eliquis. Se has a cardiac cath in 4/16 showing no significant cad.   Review of Systems  Constitutional: Negative.   HENT: Negative.   Eyes: Negative.   Respiratory: Negative.   Cardiovascular: Positive for chest pain.  Gastrointestinal: Negative.   Genitourinary: Negative.   Musculoskeletal: Negative.   Skin: Negative.     Past Medical History:  Diagnosis Date  . Anemia   . Bilateral renal cysts   . Chronic back pain    "upper and lower" (09/19/2014)  . Depression    "major" (09/19/2014)  . DVT (deep venous thrombosis) ([place])   . Esophageal spasm   . Gallstones   . Gastritis   . Gastroenteritis   . GERD (gastroesophageal reflux disease)   . Headache    "couple /month lately" (09/19/2014)  . High blood pressure   . High cholesterol   . History of blood transfusion 1985   related to hysterectomy  . Hypertension   . IBS (irritable bowel syndrome)   . Migraine    "a few times/yr" (09/19/2014)  . Myocardial infarction December 10, 2010 X 3   "while hospitalized"  . Osteoarthritis    "qwhere; mostly around my joints" (09/19/2014)  . Pneumonia 04/2014  . PTSD (post-traumatic stress disorder)   . Pulmonary embolism ([place]) 04/2001; 09/19/2014   "after gallbladder OR; "  . Schizoaffective disorder ([place])   . Sleep apnea    "mild" (09/19/2014)  . Snoring    a. sleep study 5/16: No OSA   . Spinal stenosis   . Spondylosis   . Type II diabetes mellitus ([place])    "dx'd in 2005-12-09; lost weight; no RX for ~ 4 yr now" (09/19/2014)    Family History  Problem Relation Age of Onset  . Stroke Mother     Deceased  . Lung cancer Father     Deceased  . Healthy Daughter   . Healthy Son     x 2  . Other Son     Suicide  . Heart attack Neg Hx     Social History   Social History  . Marital status: Legally Separated    Spouse name: N/A  . Number of children: N/A  . Years of education: N/A   Occupational History  . Not on file.   Social History Main Topics  . Smoking status: Former Smoker    Packs/day: 1.50    Years: 10.00    Types: Cigarettes    Quit date: 04/12/1979  . Smokeless tobacco: Never Used  . Alcohol use No  . Drug use: No  . Sexual activity: Not Currently   Other Topics Concern  . Not on file   Social History Narrative   Lives with fiancee in an apartment on the first floor.  Has 3 grown children.  One child passed away.  On disability 12-10-94 - 2000, 2007 - current.     Education: some college.  Past Surgical History:  Procedure Laterality Date  . ABDOMINAL HYSTERECTOMY  1985  . ANTERIOR CERVICAL DECOMP/DISCECTOMY FUSION  10/2012  . APPENDECTOMY  1985  . BACK SURGERY    . BREAST CYST EXCISION Right   . BREAST EXCISIONAL BIOPSY Right YRS AGO   NEG  . CARDIAC CATHETERIZATION   2000's; 2009  . CHOLECYSTECTOMY  2002  . EXCISION/RELEASE BURSA HIP Right 12/1984  . HERNIA REPAIR  1985  . LEFT HEART CATHETERIZATION WITH CORONARY ANGIOGRAM N/A 12/09/2014   Procedure: LEFT HEART CATHETERIZATION WITH CORONARY ANGIOGRAM;  Surgeon: Burnell Blanks, MD;  Location: [facility] CATH LAB;  Service: Cardiovascular;  Laterality: N/A;  . TONSILLECTOMY AND ADENOIDECTOMY  ~ 1968  . TOTAL HIP ARTHROPLASTY Left 04-06-2014  . UMBILICAL HERNIA REPAIR  1985  . VENA CAVA FILTER PLACEMENT  03/2014     Prescriptions Prior to Admission  Medication Sig Dispense Refill Last Dose   . albuterol (PROVENTIL HFA;VENTOLIN HFA) 108 (90 Base) MCG/ACT inhaler Inhale 2 puffs into the lungs every 6 (six) hours as needed for wheezing or shortness of breath. 1 Inhaler 2 Taking  . apixaban (ELIQUIS) 5 MG TABS tablet Take 1 tablet (5 mg total) by mouth 2 (two) times daily. 60 tablet 5 09/27/2016 at Unknown time  . cyclobenzaprine (FLEXERIL) 10 MG tablet Take 0.5 tablets (5 mg total) by mouth 3 (three) times daily as needed for muscle spasms. 30 tablet 0 prn  . dicyclomine (BENTYL) 20 MG tablet Take 1 tablet (20 mg total) by mouth 3 (three) times daily as needed for spasms. 90 tablet 1 prn  . diltiazem (CARDIZEM SR) 60 MG 12 hr capsule Take 1 capsule (60 mg total) by mouth every 12 (twelve) hours. 60 capsule 1 09/27/2016 at Unknown time  . famotidine (PEPCID) 20 MG tablet Take 1 tablet (20 mg total) by mouth 2 (two) times daily. 16 tablet 0 09/27/2016 at Unknown time  . furosemide (LASIX) 20 MG tablet Take 1 tablet (20 mg total) by mouth daily. 30 tablet 1 09/27/2016 at Unknown time  . gabapentin (NEURONTIN) 600 MG tablet Take 0.5 tablets (300 mg total) by mouth 3 (three) times daily. 90 tablet 1 09/27/2016 at Unknown time  . isosorbide mononitrate (IMDUR) 30 MG 24 hr tablet Take 1 tablet (30 mg total) by mouth daily. 30 tablet 1 09/27/2016 at Unknown time  . OLANZapine (ZYPREXA) 10 MG tablet Take 1 tablet (10 mg total) by mouth at bedtime. 30 tablet 1 09/26/2016 at Unknown time  . ondansetron (ZOFRAN) 4 MG tablet Take 1 tablet (4 mg total) by mouth every 8 (eight) hours as needed for nausea or vomiting. 20 tablet 0 prn  . pantoprazole (PROTONIX) 40 MG tablet Take 1 tablet (40 mg total) by mouth daily. 30 tablet 1 09/27/2016 at Unknown time  . potassium chloride SA (K-DUR,KLOR-CON) 10 MEQ tablet Take 1 tablet (10 mEq total) by mouth daily. 30 tablet 1 09/27/2016 at Unknown time  . traZODone (DESYREL) 100 MG tablet Take 200 mg by mouth at bedtime as needed for sleep.   09/26/2016 at Unknown time  .  triamcinolone cream (KENALOG) 0.1 % Apply 1 application topically 2 (two) times daily. 30 g 0 Taking  . carvedilol (COREG) 3.125 MG tablet Take 1 tablet (3.125 mg total) by mouth 2 (two) times daily with a meal. 60 tablet 1 Taking  . diphenhydrAMINE (BENADRYL) 25 MG tablet Take 1 tablet (25 mg total) by mouth every 6 (six) hours. 12 tablet 0 Taking  . escitalopram (  LEXAPRO) 20 MG tablet Take 1 tablet (20 mg total) by mouth daily. (Patient taking differently: Take 15 mg by mouth daily. ) 30 tablet 1 Taking    Physical Exam: Blood pressure 136/85, pulse 87, temperature 98.5 F (36.9 C), temperature source Oral, resp. rate 18, height 5\' 6"  (1.676 m), weight 193 lb 8 oz (87.8 kg), SpO2 95 %.   Wt Readings from Last 1 Encounters:  09/28/16 193 lb 8 oz (87.8 kg)     General appearance: alert and cooperative Resp: clear to auscultation bilaterally Cardio: regular rate and rhythm, S1, S2 normal, no murmur, click, rub or gallop GI: soft, non-tender; bowel sounds normal; no masses,  no organomegaly Extremities: extremities normal, atraumatic, no cyanosis or edema Neurologic: Grossly normal  Labs:   Lab Results  Component Value Date   WBC 7.9 09/28/2016   HGB 10.7 (L) 09/28/2016   HCT 29.8 (L) 09/28/2016   MCV 88.0 09/28/2016   PLT 318 09/28/2016    Recent Labs Lab 09/28/16 0335  NA 139  K 3.4*  CL 105  CO2 28  BUN 11  CREATININE 0.75  CALCIUM 8.5*  GLUCOSE 109*   Lab Results  Component Value Date   TROPONINI <0.03 09/28/2016      Radiology: no acute cardiopulmonayr disease EKG: nsr with no ischemia  ASSESSMENT AND PLAN:  Pt with history of hypertension, hyperlipidemia and dvt who presented with complaints of chest pain. Ruled out for an mi. Echo showed normal lv funciton with no valvular abnormalities. Will continue current meds. She had a normal cardiac cath in 2016. Etiology of chest pain is unclear. Will review the functional study and make further recs afgter completed.   Signed: Teodoro Spray MD, [facility] 09/28/2016, 12:26 PM

## 2016-09-28 NOTE — Progress Notes (Signed)
A&O. Up with standby assist. Admitted with chest pain. Troponin positive. Will continue to monitor.

## 2016-09-28 NOTE — Clinical Social Work Note (Signed)
CSW received consult for medication payment issues. This is a service provided by [facility] not CSW. CSW signing off. Please consult RNCM for this service.  Santiago Bumpers, MSW, [facility] [phone]

## 2016-09-28 NOTE — ED Notes (Signed)
Dr. Burlene Arnt notified of troponin 0.06.

## 2016-09-28 NOTE — Progress Notes (Signed)
Pt admited with chest pain and ruled out for mi. Echo showed normal lv funciton. Plan funcitonal study in am. COntinue current meds until then. Further recs after funcitonal study. FUll note to follow.

## 2016-09-28 NOTE — Progress Notes (Addendum)
[facility] [place] NAME: Vanesse Guerin    MR#:  [id number]  DATE OF BIRTH:  Jul 28, 1956  SUBJECTIVE:  CHIEF COMPLAINT:   Chief Complaint  Patient presents with  . Weakness  . Chest Pain   Chest pain on and off, nauea. Patient said she has a history of some chest spasm and GERD. REVIEW OF SYSTEMS:  Review of Systems  Constitutional: Negative for chills, fever and malaise/fatigue.  HENT: Negative for congestion and sore throat.   Eyes: Negative for blurred vision and double vision.  Respiratory: Negative for cough, shortness of breath and stridor.   Cardiovascular: Positive for chest pain. Negative for leg swelling.  Gastrointestinal: Positive for nausea. Negative for abdominal pain, blood in stool, diarrhea, melena and vomiting.  Genitourinary: Negative for dysuria and hematuria.  Musculoskeletal: Negative for back pain and joint pain.  Skin: Negative for itching and rash.  Neurological: Negative for dizziness, focal weakness and loss of consciousness.  Psychiatric/Behavioral: Negative for depression. The patient is not nervous/anxious.     DRUG ALLERGIES:   Allergies  Allergen Reactions  . Abilify [Aripiprazole] Other (See Comments)    Jerking movement, slow motor skills  . Augmentin [Amoxicillin-Pot Clavulanate] Diarrhea and Nausea And Vomiting  . Bactrim [Sulfamethoxazole-Trimethoprim] Diarrhea  . Ceftin [Cefuroxime Axetil] Diarrhea  . Coconut Oil     Rash   . Diclofenac Other (See Comments)     Muscle Pain  . Dye Fdc Red [Red Dye]     "hair dye"  . Indocin [Indomethacin] Other (See Comments)    Migraines   . Linaclotide Diarrhea  . Lisinopril Diarrhea and Other (See Comments)    Other reaction(s): Dizziness, Vomiting  . Morphine Other (See Comments)    Chest Tightness  . Morphine And Related Other (See Comments)    Chest tightness   . Simvastatin Other (See Comments)    Muscle Pain  . Sulfa Antibiotics Diarrhea    . Sulfamethoxazole-Trimethoprim Diarrhea  . Wellbutrin [Bupropion] Other (See Comments)    Jerking movement Slurred speech  . Quetiapine Fumarate Palpitations   VITALS:  Blood pressure 133/71, pulse 79, temperature 98.2 F (36.8 C), resp. rate 14, height 5\' 6"  (1.676 m), weight 193 lb 8 oz (87.8 kg), SpO2 97 %. PHYSICAL EXAMINATION:  Physical Exam  Constitutional: She is oriented to person, place, and time and well-developed, well-nourished, and in no distress.  HENT:  Head: Normocephalic and atraumatic.  Mouth/Throat: Oropharynx is clear and moist.  Eyes: Conjunctivae and EOM are normal.  Neck: Neck supple. No JVD present. No tracheal deviation present.  Cardiovascular: Normal rate, regular rhythm and normal heart sounds.  Exam reveals no gallop.   No murmur heard. Pulmonary/Chest: Effort normal and breath sounds normal. No respiratory distress. She has no wheezes. She has no rales.  Abdominal: Soft. Bowel sounds are normal. She exhibits no distension. There is no tenderness.  Musculoskeletal: Normal range of motion. She exhibits no edema or tenderness.  Neurological: She is alert and oriented to person, place, and time. No cranial nerve deficit.  Skin: No rash noted. No erythema.  Psychiatric: Affect and judgment normal.   LABORATORY PANEL:   CBC  Recent Labs Lab 09/28/16 0335  WBC 7.9  HGB 10.7*  HCT 29.8*  PLT 318   ------------------------------------------------------------------------------------------------------------------ Chemistries   Recent Labs Lab 09/28/16 0335  NA 139  K 3.4*  CL 105  CO2 28  GLUCOSE 109*  BUN 11  CREATININE 0.75  CALCIUM  8.5*   RADIOLOGY:  Dg Chest 2 View  Result Date: 09/27/2016 CLINICAL DATA:  Patient with sternal chest pain. EXAM: CHEST  2 VIEW COMPARISON:  Chest radiograph 08/30/2016. FINDINGS: Stable cardiac and mediastinal contours. No consolidative pulmonary opacities. No pleural effusion or pneumothorax. Thoracic  spine degenerative changes. Cholecystectomy clips. IVC filter. IMPRESSION: No active cardiopulmonary disease. Electronically Signed   By: Lovey Newcomer M.D.   On: 09/27/2016 20:41   ASSESSMENT AND PLAN:   Chest pain with history of CAD (coronary artery disease) Normal cardiac enzymes. Continue aspirin, Eliquis and Coreg. Stress test tomorrow per Dr. Ubaldo Glassing.    Essential hypertension, benign. The hypertension medication.   Schizoaffective disorder, depressive type ([place])   HLD (hyperlipidemia). LDL 196 last Dec. Allergy to statin (muscle pain)   GERD (gastroesophageal reflux disease): protonix. Hypokalemia Give potassium supplement.  All the records are reviewed and case discussed with Care Management/Social Worker. Management plans discussed with the patient, family and they are in agreement.  CODE STATUS: Full code  TOTAL TIME TAKING CARE OF THIS PATIENT: 28 minutes.   More than 50% of the time was spent in counseling/coordination of care: YES  POSSIBLE D/C IN 1 DAYS, DEPENDING ON CLINICAL CONDITION.   Demetrios Loll M.D on 09/28/2016 at 2:52 PM  Between 7am to 6pm - Pager - [phone]  After 6pm go to [email or web address] - Proofreader  [facility]  Office  [phone]  CC: Primary care physician; [name], MD  Note: This dictation was prepared with Dragon dictation along with smaller phrase technology. Any transcriptional errors that result from this process are unintentional.

## 2016-09-29 ENCOUNTER — Observation Stay: Payer: Medicare Other

## 2016-09-29 ENCOUNTER — Telehealth: Payer: Self-pay | Admitting: Family Medicine

## 2016-09-29 ENCOUNTER — Emergency Department: Payer: Medicare Other

## 2016-09-29 ENCOUNTER — Emergency Department
Admission: EM | Admit: 2016-09-29 | Discharge: 2016-09-30 | Disposition: A | Discharge status: Home / Self Care | Payer: Medicare Other | Source: Home / Self Care | Attending: Student in an Organized Health Care Education/Training Program | Admitting: Student in an Organized Health Care Education/Training Program

## 2016-09-29 ENCOUNTER — Encounter: Payer: Self-pay | Admitting: Emergency Medicine

## 2016-09-29 DIAGNOSIS — K219 Gastro-esophageal reflux disease without esophagitis: Secondary | ICD-10-CM | POA: Diagnosis not present

## 2016-09-29 DIAGNOSIS — R509 Fever, unspecified: Secondary | ICD-10-CM | POA: Insufficient documentation

## 2016-09-29 DIAGNOSIS — E119 Type 2 diabetes mellitus without complications: Secondary | ICD-10-CM

## 2016-09-29 DIAGNOSIS — R112 Nausea with vomiting, unspecified: Secondary | ICD-10-CM | POA: Insufficient documentation

## 2016-09-29 DIAGNOSIS — Z87891 Personal history of nicotine dependence: Secondary | ICD-10-CM

## 2016-09-29 DIAGNOSIS — R1084 Generalized abdominal pain: Secondary | ICD-10-CM

## 2016-09-29 DIAGNOSIS — R51 Headache: Secondary | ICD-10-CM | POA: Insufficient documentation

## 2016-09-29 DIAGNOSIS — I252 Old myocardial infarction: Secondary | ICD-10-CM

## 2016-09-29 DIAGNOSIS — Z79899 Other long term (current) drug therapy: Secondary | ICD-10-CM | POA: Insufficient documentation

## 2016-09-29 DIAGNOSIS — I10 Essential (primary) hypertension: Secondary | ICD-10-CM

## 2016-09-29 DIAGNOSIS — I2510 Atherosclerotic heart disease of native coronary artery without angina pectoris: Secondary | ICD-10-CM | POA: Diagnosis not present

## 2016-09-29 DIAGNOSIS — R079 Chest pain, unspecified: Secondary | ICD-10-CM | POA: Diagnosis not present

## 2016-09-29 LAB — CBC
HCT: 31.3 % — ABNORMAL LOW (ref 35.0–47.0)
Hemoglobin: 10.8 g/dL — ABNORMAL LOW (ref 12.0–16.0)
MCH: 31.2 pg (ref 26.0–34.0)
MCHC: 34.6 g/dL (ref 32.0–36.0)
MCV: 90.2 fL (ref 80.0–100.0)
Platelets: 323 10*3/uL (ref 150–440)
RBC: 3.48 MIL/uL — ABNORMAL LOW (ref 3.80–5.20)
RDW: 13.8 % (ref 11.5–14.5)
WBC: 6.7 10*3/uL (ref 3.6–11.0)

## 2016-09-29 LAB — URINALYSIS, COMPLETE (UACMP) WITH MICROSCOPIC
Bilirubin Urine: NEGATIVE
Glucose, UA: NEGATIVE mg/dL
Ketones, ur: NEGATIVE mg/dL
Nitrite: NEGATIVE
Protein, ur: NEGATIVE mg/dL
Specific Gravity, Urine: 1.017 (ref 1.005–1.030)
pH: 5 (ref 5.0–8.0)

## 2016-09-29 LAB — COMPREHENSIVE METABOLIC PANEL
ALT: 25 U/L (ref 14–54)
AST: 35 U/L (ref 15–41)
Albumin: 3.7 g/dL (ref 3.5–5.0)
Alkaline Phosphatase: 101 U/L (ref 38–126)
Anion gap: 6 (ref 5–15)
BUN: 13 mg/dL (ref 6–20)
CO2: 27 mmol/L (ref 22–32)
Calcium: 9 mg/dL (ref 8.9–10.3)
Chloride: 102 mmol/L (ref 101–111)
Creatinine, Ser: 0.98 mg/dL (ref 0.44–1.00)
GFR calc Af Amer: >60 mL/min (ref >60)
GFR calc non Af Amer: >60 mL/min (ref >60)
Glucose, Bld: 109 mg/dL — ABNORMAL HIGH (ref 65–99)
Potassium: 4.1 mmol/L (ref 3.5–5.1)
Sodium: 135 mmol/L (ref 135–145)
Total Bilirubin: 0.4 mg/dL (ref 0.3–1.2)
Total Protein: 7.2 g/dL (ref 6.5–8.1)

## 2016-09-29 LAB — INFLUENZA PANEL BY PCR (TYPE A & B)
Influenza A By PCR: NEGATIVE
Influenza B By PCR: NEGATIVE

## 2016-09-29 LAB — LIPASE, BLOOD: Lipase: <10 U/L — ABNORMAL LOW (ref 11–51)

## 2016-09-29 MED ORDER — SODIUM CHLORIDE 0.9 % IV BOLUS (SEPSIS)
1000.0000 mL | Freq: Once | INTRAVENOUS | Status: AC
  Administered 2016-09-29: 1000 mL via INTRAVENOUS

## 2016-09-29 MED ORDER — REGADENOSON 0.4 MG/5ML IV SOLN
0.4000 mg | Freq: Once | INTRAVENOUS | Status: AC
  Administered 2016-09-29: 0.4 mg via INTRAVENOUS

## 2016-09-29 MED ORDER — TECHNETIUM TC 99M TETROFOSMIN IV KIT
12.6790 | PACK | Freq: Once | INTRAVENOUS | Status: AC | PRN
  Administered 2016-09-29: 12.679 via INTRAVENOUS

## 2016-09-29 MED ORDER — TECHNETIUM TC 99M TETROFOSMIN IV KIT
29.9200 | PACK | Freq: Once | INTRAVENOUS | Status: AC | PRN
  Administered 2016-09-29: 29.92 via INTRAVENOUS

## 2016-09-29 MED ORDER — IOPAMIDOL (ISOVUE-300) INJECTION 61%: 30.0000 mL | Freq: Once | INTRAVENOUS | Status: DC

## 2016-09-29 NOTE — Telephone Encounter (Signed)
Valerie Vazquez from [facility] called needed a HFU with Dr. Caryl Bis for chest pain in 1 to 2 weeks.  Scheduled for 2/5 at 11:30.

## 2016-09-29 NOTE — ED Provider Notes (Signed)
[facility] Emergency Department Provider Note    None    (approximate)  I have reviewed the triage vital signs and the nursing notes.   HISTORY  Chief Complaint Abdominal Pain; Fever; and Headache    HPI Destyn Waddy is a 61 y.o. female who presents with diffuse abdominal pain and nausea without vomiting associated with fever and headache. Symptoms started today. Patient was recently admitted to the hospital for chest pain rule out. States that her complaint when coming to the hospital initially was associated with abdominal pain that she feels was never fully evaluated. She states that she has had her appendix pulled out and does not know of having any history of diverticulosis. Denies any blood in her stools. No melena.   Past Medical History:  Diagnosis Date  . Anemia   . Bilateral renal cysts   . Chronic back pain    "upper and lower" (09/19/2014)  . Depression    "major" (09/19/2014)  . DVT (deep venous thrombosis) ([place])   . Esophageal spasm   . Gallstones   . Gastritis   . Gastroenteritis   . GERD (gastroesophageal reflux disease)   . Headache    "couple /month lately" (09/19/2014)  . High blood pressure   . High cholesterol   . History of blood transfusion 1985   related to hysterectomy  . Hypertension   . IBS (irritable bowel syndrome)   . Migraine    "a few times/yr" (09/19/2014)  . Myocardial infarction 10/2010 X 3   "while hospitalized"  . Osteoarthritis    "qwhere; mostly around my joints" (09/19/2014)  . Pneumonia 04/2014  . PTSD (post-traumatic stress disorder)   . Pulmonary embolism ([place]) 04/2001; 09/19/2014   "after gallbladder OR; "  . Schizoaffective disorder ([place])   . Sleep apnea    "mild" (09/19/2014)  . Snoring    a. sleep study 5/16: No OSA  . Spinal stenosis   . Spondylosis   . Type II diabetes mellitus ([place])    "dx'd in 2007; lost weight; no RX for ~ 4 yr now" (09/19/2014)   Family History  Problem  Relation Age of Onset  . Stroke Mother     Deceased  . Lung cancer Father     Deceased  . Healthy Daughter   . Healthy Son     x 2  . Other Son     Suicide  . Heart attack Neg Hx    Past Surgical History:  Procedure Laterality Date  . ABDOMINAL HYSTERECTOMY  1985  . ANTERIOR CERVICAL DECOMP/DISCECTOMY FUSION  10/2012  . APPENDECTOMY  1985  . BACK SURGERY    . BREAST CYST EXCISION Right   . BREAST EXCISIONAL BIOPSY Right YRS AGO   NEG  . CARDIAC CATHETERIZATION   2000's; 2009  . CHOLECYSTECTOMY  2002  . EXCISION/RELEASE BURSA HIP Right 12/1984  . HERNIA REPAIR  1985  . LEFT HEART CATHETERIZATION WITH CORONARY ANGIOGRAM N/A 12/09/2014   Procedure: LEFT HEART CATHETERIZATION WITH CORONARY ANGIOGRAM;  Surgeon: Burnell Blanks, MD;  Location[facility] C[facility] LAB;  Service: Cardiovascular;  Laterality: N/A;  . TONSILLECTOMY AND ADENOIDECTOMY  ~ 1968  . TOTAL HIP ARTHROPLASTY Left 04-06-2014  . UMBILICAL HERNIA REPAIR  1985  . VENA CAVA FILTER PLACEMENT  03/2014   Patient Active Problem List   Diagnosis Date Noted  . CAD (coronary artery disease) 09/28/2016  . Diarrhea 09/16/2016  . Epigastric discomfort 09/16/2016  . Schizoaffective disorder, depressive type (  [place]) 08/28/2016  . Sinusitis, acute maxillary 08/27/2016  . Suicide attempt 08/27/2016  . Rash and nonspecific skin eruption 04/16/2016  . Colitis 04/08/2016  . Tremor 03/19/2016  . Left shoulder pain 02/18/2016  . Abdominal distension 02/18/2016  . Absolute anemia 01/29/2016  . Chronic pain 01/29/2016  . Neurogenic pain 01/29/2016  . Musculoskeletal pain 01/29/2016  . Long term current use of opiate analgesic 01/29/2016  . Long term prescription opiate use 01/29/2016  . Opiate use (15 MME/Day) 01/29/2016  . Encounter for therapeutic drug level monitoring 01/29/2016  . Encounter for pain management planning 01/29/2016  . Chronic neck pain (midline) 01/29/2016  . Chronic chest wall pain 01/29/2016  . Chronic low back  pain (Location of Secondary source of pain) (Bilateral) (R>L) 01/29/2016  . Disturbance of skin sensation 01/29/2016  . Chronic shoulder pain (Location of Primary Source of Pain) (Left) 01/29/2016  . Chronic hip pain (Location of Tertiary source of pain) (Bilateral) (R>L) 01/29/2016  . Osteoarthritis of hip (Bilateral) 01/29/2016  . Lumbar facet arthropathy (Severe at L2-3, L3-4, L4-5, and L5-S1) 01/29/2016  . Lumbar central spinal stenosis (moderate at L4-5; mild at L2-3 and L3-4) 01/29/2016  . Lumbar foraminal stenosis (L4-5) (Right) 01/29/2016  . Lumbar facet syndrome 01/29/2016  . Hx of cervical spine surgery 01/29/2016  . Numbness 11/19/2015  . Right wrist pain 11/09/2015  . Breast pain 10/19/2015  . Chest pain 10/14/2015  . Headache 10/14/2015  . Abdominal pain 10/14/2015  . Lip swelling 10/14/2015  . Pulmonary embolism ([place]) 08/15/2015  . Bilateral pneumonia 08/15/2015  . Nausea 08/15/2015  . Shortness of breath 07/31/2015  . Pleural effusion, bilateral 07/31/2015  . Cough 07/31/2015  . Chronic pain of multiple sites 06/05/2015  . Chronic lumbosacral pain 06/05/2015  . Esophageal spasm 06/05/2015  . History of DVT (deep vein thrombosis) 02/25/2015  . 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 Constipation 12/12/2014  . Coronary vasospasm ([place]) 12/09/2014  . History of pulmonary embolus (PE) 09/19/2014  . Impingement syndrome of shoulder 08/30/2014  . Impingement syndrome of left shoulder 08/30/2014  . Injury of superior glenoid labrum of shoulder joint 08/16/2014  . Supraspinatus tenosynovitis 08/16/2014  . Cervical spondylosis without myelopathy 08/16/2014  . Anxiety and depression 04/13/2014  . Osteoarthritis of hip (Left) 04/11/2014  . S/P THR (total hip replacement) (Left) 04/11/2014  . IBS (irritable bowel syndrome) 04/11/2014  . GERD (gastroesophageal reflux disease) 04/11/2014  . Insomnia 04/11/2014  . Essential hypertension, benign 10/28/2013  . Left hip pain 10/28/2013    . Anemia 10/28/2013  . HLD (hyperlipidemia) 10/28/2013      Prior to Admission medications   Medication Sig Start Date End Date Taking? Authorizing Provider  albuterol (PROVENTIL HFA;VENTOLIN HFA) 108 (90 Base) MCG/ACT inhaler Inhale 2 puffs into the lungs every 6 (six) hours as needed for wheezing or shortness of breath. 09/03/16  Yes Jolanta B Pucilowska, MD  apixaban (ELIQUIS) 5 MG TABS tablet Take 1 tablet (5 mg total) by mouth 2 (two) times daily. 09/25/16  Yes [name], MD  carvedilol (COREG) 3.125 MG tablet Take 1 tablet (3.125 mg total) by mouth 2 (two) times daily with a meal. 09/03/16  Yes Jolanta B Pucilowska, MD  dicyclomine (BENTYL) 20 MG tablet Take 1 tablet (20 mg total) by mouth 3 (three) times daily as needed for spasms. 09/16/16 09/16/17 Yes [name], MD  diltiazem (CARDIZEM SR) 60 MG 12 hr capsule Take 1 capsule (60 mg total) by mouth every 12 (twelve) hours. 09/03/16  Yes Jolanta B Pucilowska, MD  diphenhydrAMINE (BENADRYL) 25 MG tablet Take 1 tablet (25 mg total) by mouth every 6 (six) hours. 09/12/16  Yes Jola Schmidt, MD  escitalopram (LEXAPRO) 10 MG tablet Take 15 mg by mouth daily.   Yes Historical Provider, MD  famotidine (PEPCID) 20 MG tablet Take 1 tablet (20 mg total) by mouth 2 (two) times daily. 09/12/16  Yes Jola Schmidt, MD  furosemide (LASIX) 20 MG tablet Take 1 tablet (20 mg total) by mouth daily. 09/04/16  Yes Jolanta B Pucilowska, MD  gabapentin (NEURONTIN) 600 MG tablet Take 0.5 tablets (300 mg total) by mouth 3 (three) times daily. 09/16/16  Yes [name], MD  isosorbide mononitrate (IMDUR) 30 MG 24 hr tablet Take 1 tablet (30 mg total) by mouth daily. 09/16/16  Yes [name], MD  OLANZapine (ZYPREXA) 10 MG tablet Take 1 tablet (10 mg total) by mouth at bedtime. 09/03/16  Yes Jolanta B Pucilowska, MD  ondansetron (ZOFRAN) 4 MG tablet Take 1 tablet (4 mg total) by mouth every 8 (eight) hours as needed for nausea or vomiting. 09/25/16  Yes  [name], MD  pantoprazole (PROTONIX) 40 MG tablet Take 1 tablet (40 mg total) by mouth daily. 09/04/16  Yes Jolanta B Pucilowska, MD  potassium chloride SA (K-DUR,KLOR-CON) 10 MEQ tablet Take 1 tablet (10 mEq total) by mouth daily. 09/04/16  Yes Jolanta B Pucilowska, MD  traZODone (DESYREL) 100 MG tablet Take 200 mg by mouth at bedtime as needed for sleep.   Yes Historical Provider, MD  triamcinolone cream (KENALOG) 0.1 % Apply 1 application topically 2 (two) times daily. 09/13/16  Yes Kayla Rose, PA-C  cyclobenzaprine (FLEXERIL) 10 MG tablet Take 0.5 tablets (5 mg total) by mouth 3 (three) times daily as needed for muscle spasms. 09/25/16   [name], MD  magnesium hydroxide (MILK OF MAGNESIA) 400 MG/5ML suspension Take 30 mLs by mouth daily as needed for mild constipation. 09/30/16 09/30/17  Merlyn Lot, MD  polyethylene glycol [facility] / Floria Raveling) packet Take 17 g by mouth daily. Mix one tablespoon with 8oz of your favorite juice or water every day until you are having soft formed stools. Then start taking once daily if you didn't have a stool the day before. 09/30/16   Merlyn Lot, MD    Allergies Abilify [aripiprazole]; Augmentin [amoxicillin-pot clavulanate]; Bactrim [sulfamethoxazole-trimethoprim]; Ceftin [cefuroxime axetil]; Coconut oil; Diclofenac; Dye fdc red [red dye]; Indocin [indomethacin]; Linaclotide; Lisinopril; Morphine; Morphine and related; Simvastatin; Sulfa antibiotics; Sulfamethoxazole-trimethoprim; Wellbutrin [bupropion]; and Quetiapine fumarate    Social History Social History  Substance Use Topics  . Smoking status: Former Smoker    Packs/day: 1.50    Years: 10.00    Types: Cigarettes    Quit date: 04/12/1979  . Smokeless tobacco: Never Used  . Alcohol use No    Review of Systems Patient denies headaches, rhinorrhea, blurry vision, numbness, shortness of breath, chest pain, edema, cough, abdominal pain, nausea, vomiting, diarrhea, dysuria,  fevers, rashes or hallucinations unless otherwise stated above in HPI. ____________________________________________   PHYSICAL EXAM:  VITAL SIGNS: Vitals:   09/29/16 2226 09/29/16 2334  BP:  134/73  Pulse: 83 85  Resp:  18  Temp:  (!) 100.4 F (38 C)    Constitutional: Alert and oriented. Well appearing and in no acute distress. Eyes: Conjunctivae are normal. PERRL. EOMI. Head: Atraumatic. Nose: No congestion/rhinnorhea. Mouth/Throat: Mucous membranes are moist.  Oropharynx non-erythematous. Neck: No stridor. Painless ROM. No cervical spine tenderness to palpation  Hematological/Lymphatic/Immunilogical: No cervical lymphadenopathy. Cardiovascular: Normal rate, regular rhythm. Grossly normal heart sounds.  Good peripheral circulation. Respiratory: Normal respiratory effort.  No retractions. Lungs CTAB. Gastrointestinal: Soft and mild ttp diffusely No distention. No abdominal bruits. No CVA tenderness. Genitourinary:  Musculoskeletal: No lower extremity tenderness nor edema.  No joint effusions. Neurologic:  Normal speech and language. No gross focal neurologic deficits are appreciated. No gait instability. Skin:  Skin is warm, dry and intact. No rash noted. Psychiatric: Mood and affect are normal. Speech and behavior are normal.  ____________________________________________   LABS (all labs ordered are listed, but only abnormal results are displayed)  Results for orders placed or performed during the hospital encounter of 09/29/16 (from the past 24 hour(s))  Lipase, blood     Status: Abnormal   Collection Time: 09/29/16  9:15 PM  Result Value Ref Range   Lipase <10 (L) 11 - 51 U/L  Comprehensive metabolic panel     Status: Abnormal   Collection Time: 09/29/16  9:15 PM  Result Value Ref Range   Sodium 135 135 - 145 mmol/L   Potassium 4.1 3.5 - 5.1 mmol/L   Chloride 102 101 - 111 mmol/L   CO2 27 22 - 32 mmol/L   Glucose, Bld 109 (H) 65 - 99 mg/dL   BUN 13 6 - 20 mg/dL    Creatinine, Ser 0.98 0.44 - 1.00 mg/dL   Calcium 9.0 8.9 - 10.3 mg/dL   Total Protein 7.2 6.5 - 8.1 g/dL   Albumin 3.7 3.5 - 5.0 g/dL   AST 35 15 - 41 U/L   ALT 25 14 - 54 U/L   Alkaline Phosphatase 101 38 - 126 U/L   Total Bilirubin 0.4 0.3 - 1.2 mg/dL   GFR calc non Af Amer >60 >60 mL/min   GFR calc Af Amer >60 >60 mL/min   Anion gap 6 5 - 15  CBC     Status: Abnormal   Collection Time: 09/29/16  9:15 PM  Result Value Ref Range   WBC 6.7 3.6 - 11.0 K/uL   RBC 3.48 (L) 3.80 - 5.20 MIL/uL   Hemoglobin 10.8 (L) 12.0 - 16.0 g/dL   HCT 31.3 (L) 35.0 - 47.0 %   MCV 90.2 80.0 - 100.0 fL   MCH 31.2 26.0 - 34.0 pg   MCHC 34.6 32.0 - 36.0 g/dL   RDW 13.8 11.5 - 14.5 %   Platelets 323 150 - 440 K/uL  Urinalysis, Complete w Microscopic     Status: Abnormal   Collection Time: 09/29/16  9:15 PM  Result Value Ref Range   Color, Urine YELLOW (A) YELLOW   APPearance CLEAR (A) CLEAR   Specific Gravity, Urine 1.017 1.005 - 1.030   pH 5.0 5.0 - 8.0   Glucose, UA NEGATIVE NEGATIVE mg/dL   Hgb urine dipstick SMALL (A) NEGATIVE   Bilirubin Urine NEGATIVE NEGATIVE   Ketones, ur NEGATIVE NEGATIVE mg/dL   Protein, ur NEGATIVE NEGATIVE mg/dL   Nitrite NEGATIVE NEGATIVE   Leukocytes, UA TRACE (A) NEGATIVE   RBC / HPF 0-5 0 - 5 RBC/hpf   WBC, UA 0-5 0 - 5 WBC/hpf   Bacteria, UA RARE (A) NONE SEEN   Squamous Epithelial / LPF 0-5 (A) NONE SEEN   Mucous PRESENT   Influenza panel by PCR (type A & B)     Status: None   Collection Time: 09/29/16 10:18 PM  Result Value Ref Range   Influenza A By PCR NEGATIVE NEGATIVE  Influenza B By PCR NEGATIVE NEGATIVE   ____________________________________________  EKG  ____________________________________________  RADIOLOGY  I personally reviewed all radiographic images ordered to evaluate for the above acute complaints and reviewed radiology reports and findings.  These findings were personally discussed with the patient.  Please see medical record for  radiology report.  ____________________________________________   PROCEDURES  Procedure(s) performed:  Procedures    Critical Care performed: no ____________________________________________   INITIAL IMPRESSION / ASSESSMENT AND PLAN / ED COURSE  Pertinent labs & imaging results that were available during my care of the patient were reviewed by me and considered in my medical decision making (see chart for details).  DDX: diverticulitis, flu, colitis, cholelithiasis, appendicitis, abscess  Yesina Lucia is a 61 y.o. who presents to the ED with chief complaint of abdominal pain and fevers as described above. Patient otherwise hemodynamic stable and does not appear to be in any acute distress. Blood work without any evidence of leukocytosis. Based on patient's pain will order CT scan to evaluate for diverticulitis or abscess.  The patient will be placed on continuous pulse oximetry and telemetry for monitoring.  Laboratory evaluation will be sent to evaluate for the above complaints.     Clinical Course as of Sep 30 40  Tue Sep 30, 2016  0037 Patient management in stable. Does have large volume stool on CT imaging. No evidence of abscess or significant colitis. Fever likely secondary to viral process.  No acidosis or leukocytosis to suggest systemic illness.  Patient was able to tolerate PO and was able to ambulate with a steady gait. We'll start MiraLAX and discussed strict return precautions.  Have discussed with the patient and available family all diagnostics and treatments performed thus far and all questions were answered to the best of my ability. The patient demonstrates understanding and agreement with plan.   [PR]    Clinical Course User Index [PR] Merlyn Lot, MD     ____________________________________________   FINAL CLINICAL IMPRESSION(S) / ED DIAGNOSES  Final diagnoses:  Generalized abdominal pain  Fever, unspecified fever cause      NEW  MEDICATIONS STARTED DURING THIS VISIT:  New Prescriptions   MAGNESIUM HYDROXIDE (MILK OF MAGNESIA) 400 MG/5ML SUSPENSION    Take 30 mLs by mouth daily as needed for mild constipation.   POLYETHYLENE GLYCOL (MIRALAX / GLYCOLAX) PACKET    Take 17 g by mouth daily. Mix one tablespoon with 8oz of your favorite juice or water every day until you are having soft formed stools. Then start taking once daily if you didn't have a stool the day before.     Note:  This document was prepared using Dragon voice recognition software and may include unintentional dictation errors.    Merlyn Lot, MD 09/30/16 [phone]

## 2016-09-29 NOTE — Discharge Instructions (Signed)

## 2016-09-29 NOTE — Progress Notes (Signed)
A&O. Independent. Refused bed alarm. Medicated for insomnia and pain during the night. SLept well. For stress test this AM.

## 2016-09-29 NOTE — Progress Notes (Signed)
Stress sestimibi did not show any evidence of ischemia. EF was normal. No evidence of pregressive cad. No further cardiac workup indicated at present. OK for discharge form cardiac standpoint.

## 2016-09-29 NOTE — Care Management Obs Status (Signed)
[place] NOTIFICATION   Patient Details  Name: Valerie Vazquez MRN: [id number] Date of Birth: [birth year]   Medicare Observation Status Notification Given:  Yes    Katrina Stack, RN 09/29/2016, 3:29 PM

## 2016-09-29 NOTE — Progress Notes (Signed)
Discharge instructions explained to pt/ verbalized an understanding/ iv and tele removed/ will transport off unit via wheelchair.  

## 2016-09-29 NOTE — Discharge Summary (Signed)
[facility] NAME: Valerie Vazquez    MR#:  [id number]  DATE OF BIRTH:  Dec 30, 1955  DATE OF ADMISSION:  09/27/2016   ADMITTING PHYSICIAN: Lance Coon, MD  DATE OF DISCHARGE: 09/29/2016  PRIMARY CARE PHYSICIAN: [name], MD   ADMISSION DIAGNOSIS:  Chest pain, unspecified type [R07.9] DISCHARGE DIAGNOSIS:  Principal Problem:   Chest pain Active Problems:   Essential hypertension, benign   HLD (hyperlipidemia)   GERD (gastroesophageal reflux disease)   Schizoaffective disorder, depressive type (HCC)   CAD (coronary artery disease)  SECONDARY DIAGNOSIS:   Past Medical History:  Diagnosis Date  . Anemia   . Bilateral renal cysts   . Chronic back pain    "upper and lower" (09/19/2014)  . Depression    "major" (09/19/2014)  . DVT (deep venous thrombosis) ([place])   . Esophageal spasm   . Gallstones   . Gastritis   . Gastroenteritis   . GERD (gastroesophageal reflux disease)   . Headache    "couple /month lately" (09/19/2014)  . High blood pressure   . High cholesterol   . History of blood transfusion 1985   related to hysterectomy  . Hypertension   . IBS (irritable bowel syndrome)   . Migraine    "a few times/yr" (09/19/2014)  . Myocardial infarction 10/2010 X 3   "while hospitalized"  . Osteoarthritis    "qwhere; mostly around my joints" (09/19/2014)  . Pneumonia 04/2014  . PTSD (post-traumatic stress disorder)   . Pulmonary embolism ([place]) 04/2001; 09/19/2014   "after gallbladder OR; "  . Schizoaffective disorder ([place])   . Sleep apnea    "mild" (09/19/2014)  . Snoring    a. sleep study 5/16: No OSA  . Spinal stenosis   . Spondylosis   . Type II diabetes mellitus ([place])    "dx'd in 2007; lost weight; no RX for ~ 4 yr now" (09/19/2014)   HOSPITAL COURSE:   Atypical chest pain Normal serial troponins, echocardiogram is normal, stress test is normal per Dr. Ubaldo Glassing.    CAD (coronary artery disease) - continue  home meds   Essential hypertension, benign - stable, continue home meds   Schizoaffective disorder, depressive type ([place]) - home dose psychiatric meds   HLD (hyperlipidemia) - home dose anti-lipids   GERD (gastroesophageal reflux disease) - home dose PPI  DISCHARGE CONDITIONS:  Stable, discharge to home today. CONSULTS OBTAINED:  Treatment Team:  Teodoro Spray, MD DRUG ALLERGIES:   Allergies  Allergen Reactions  . Abilify [Aripiprazole] Other (See Comments)    Jerking movement, slow motor skills  . Augmentin [Amoxicillin-Pot Clavulanate] Diarrhea and Nausea And Vomiting  . Bactrim [Sulfamethoxazole-Trimethoprim] Diarrhea  . Ceftin [Cefuroxime Axetil] Diarrhea  . Coconut Oil     Rash   . Diclofenac Other (See Comments)     Muscle Pain  . Dye Fdc Red [Red Dye]     "hair dye"  . Indocin [Indomethacin] Other (See Comments)    Migraines   . Linaclotide Diarrhea  . Lisinopril Diarrhea and Other (See Comments)    Other reaction(s): Dizziness, Vomiting  . Morphine Other (See Comments)    Chest Tightness  . Morphine And Related Other (See Comments)    Chest tightness   . Simvastatin Other (See Comments)    Muscle Pain  . Sulfa Antibiotics Diarrhea  . Sulfamethoxazole-Trimethoprim Diarrhea  . Wellbutrin [Bupropion] Other (See Comments)    Jerking movement Slurred speech  . Quetiapine Fumarate  Palpitations   DISCHARGE MEDICATIONS:   Allergies as of 09/29/2016      Reactions   Abilify [aripiprazole] Other (See Comments)   Jerking movement, slow motor skills   Augmentin [amoxicillin-pot Clavulanate] Diarrhea, Nausea And Vomiting   Bactrim [sulfamethoxazole-trimethoprim] Diarrhea   Ceftin [cefuroxime Axetil] Diarrhea   Coconut Oil    Rash    Diclofenac Other (See Comments)    Muscle Pain   Dye Fdc Red [red Dye]    "hair dye"   Indocin [indomethacin] Other (See Comments)   Migraines    Linaclotide Diarrhea   Lisinopril Diarrhea, Other (See Comments)   Other  reaction(s): Dizziness, Vomiting   Morphine Other (See Comments)   Chest Tightness   Morphine And Related Other (See Comments)   Chest tightness    Simvastatin Other (See Comments)   Muscle Pain   Sulfa Antibiotics Diarrhea   Sulfamethoxazole-trimethoprim Diarrhea   Wellbutrin [bupropion] Other (See Comments)   Jerking movement Slurred speech   Quetiapine Fumarate Palpitations      Medication List    TAKE these medications   albuterol 108 (90 Base) MCG/ACT inhaler Commonly known as:  PROVENTIL HFA;VENTOLIN HFA Inhale 2 puffs into the lungs every 6 (six) hours as needed for wheezing or shortness of breath.   apixaban 5 MG Tabs tablet Commonly known as:  ELIQUIS Take 1 tablet (5 mg total) by mouth 2 (two) times daily.   carvedilol 3.125 MG tablet Commonly known as:  COREG Take 1 tablet (3.125 mg total) by mouth 2 (two) times daily with a meal.   cyclobenzaprine 10 MG tablet Commonly known as:  FLEXERIL Take 0.5 tablets (5 mg total) by mouth 3 (three) times daily as needed for muscle spasms.   dicyclomine 20 MG tablet Commonly known as:  BENTYL Take 1 tablet (20 mg total) by mouth 3 (three) times daily as needed for spasms.   diltiazem 60 MG 12 hr capsule Commonly known as:  CARDIZEM SR Take 1 capsule (60 mg total) by mouth every 12 (twelve) hours.   diphenhydrAMINE 25 MG tablet Commonly known as:  BENADRYL Take 1 tablet (25 mg total) by mouth every 6 (six) hours.   escitalopram 20 MG tablet Commonly known as:  LEXAPRO Take 1 tablet (20 mg total) by mouth daily. What changed:  how much to take   famotidine 20 MG tablet Commonly known as:  PEPCID Take 1 tablet (20 mg total) by mouth 2 (two) times daily.   furosemide 20 MG tablet Commonly known as:  LASIX Take 1 tablet (20 mg total) by mouth daily.   gabapentin 600 MG tablet Commonly known as:  NEURONTIN Take 0.5 tablets (300 mg total) by mouth 3 (three) times daily.   isosorbide mononitrate 30 MG 24 hr  tablet Commonly known as:  IMDUR Take 1 tablet (30 mg total) by mouth daily.   OLANZapine 10 MG tablet Commonly known as:  ZYPREXA Take 1 tablet (10 mg total) by mouth at bedtime.   ondansetron 4 MG tablet Commonly known as:  ZOFRAN Take 1 tablet (4 mg total) by mouth every 8 (eight) hours as needed for nausea or vomiting.   pantoprazole 40 MG tablet Commonly known as:  PROTONIX Take 1 tablet (40 mg total) by mouth daily.   potassium chloride 10 MEQ tablet Commonly known as:  K-DUR,KLOR-CON Take 1 tablet (10 mEq total) by mouth daily.   traZODone 100 MG tablet Commonly known as:  DESYREL Take 200 mg by mouth at bedtime as needed  for sleep.   triamcinolone cream 0.1 % Commonly known as:  KENALOG Apply 1 application topically 2 (two) times daily.        DISCHARGE INSTRUCTIONS:  See AVS.  If you experience worsening of your admission symptoms, develop shortness of breath, life threatening emergency, suicidal or homicidal thoughts you must seek medical attention immediately by calling 911 or calling your MD immediately  if symptoms less severe.  You Must read complete instructions/literature along with all the possible adverse reactions/side effects for all the Medicines you take and that have been prescribed to you. Take any new Medicines after you have completely understood and accpet all the possible adverse reactions/side effects.   Please note  You were cared for by a hospitalist during your hospital stay. If you have any questions about your discharge medications or the care you received while you were in the hospital after you are discharged, you can call the unit and asked to speak with the hospitalist on call if the hospitalist that took care of you is not available. Once you are discharged, your primary care physician will handle any further medical issues. Please note that NO REFILLS for any discharge medications will be authorized once you are discharged, as it is  imperative that you return to your primary care physician (or establish a relationship with a primary care physician if you do not have one) for your aftercare needs so that they can reassess your need for medications and monitor your lab values.    On the day of Discharge:  VITAL SIGNS:  Blood pressure (!) 110/56, pulse 69, temperature 97.9 F (36.6 C), resp. rate 18, height 5\' 6"  (1.676 m), weight 193 lb 8 oz (87.8 kg), SpO2 96 %. PHYSICAL EXAMINATION:  GENERAL:  61 y.o.-year-old patient lying in the bed with no acute distress.  EYES: Pupils equal, round, reactive to light and accommodation. No scleral icterus. Extraocular muscles intact.  HEENT: Head atraumatic, normocephalic. Oropharynx and nasopharynx clear.  NECK:  Supple, no jugular venous distention. No thyroid enlargement, no tenderness.  LUNGS: Normal breath sounds bilaterally, no wheezing, rales,rhonchi or crepitation. No use of accessory muscles of respiration.  CARDIOVASCULAR: S1, S2 normal. No murmurs, rubs, or gallops.  ABDOMEN: Soft, non-tender, non-distended. Bowel sounds present. No organomegaly or mass.  EXTREMITIES: No pedal edema, cyanosis, or clubbing.  NEUROLOGIC: Cranial nerves II through XII are intact. Muscle strength 5/5 in all extremities. Sensation intact. Gait not checked.  PSYCHIATRIC: The patient is alert and oriented x 3.  SKIN: No obvious rash, lesion, or ulcer.  DATA REVIEW:   CBC  Recent Labs Lab 09/28/16 0335  WBC 7.9  HGB 10.7*  HCT 29.8*  PLT 318    Chemistries   Recent Labs Lab 09/28/16 0335  NA 139  K 3.4*  CL 105  CO2 28  GLUCOSE 109*  BUN 11  CREATININE 0.75  CALCIUM 8.5*     Microbiology Results  Results for orders placed or performed in visit on 10/12/15  Urine Culture     Status: None   Collection Time: 10/12/15  2:42 PM  Result Value Ref Range Status   Colony Count NO GROWTH  Final   Organism ID, Bacteria NO GROWTH  Final    RADIOLOGY:  No results  found.   Management plans discussed with the patient, family and they are in agreement.  CODE STATUS:     Code Status Orders        Start     Ordered  09/28/16 0305  Full code  Continuous     09/28/16 0304    Code Status History    Date Active Date Inactive Code Status Order ID Comments User Context   08/28/2016  4:51 PM 09/04/2016  8:08 PM Full Code [id number]  Gonzella Lex, MD Inpatient   12/09/2014  5:24 PM 12/12/2014  5:08 PM Full Code [id number]  Burnell Blanks, MD Inpatient   09/19/2014  8:44 PM 09/24/2014  6:36 PM Full Code [id number]  Allyne Gee, MD Inpatient      TOTAL TIME TAKING CARE OF THIS PATIENT: 23 minutes.    Demetrios Loll M.D on 09/29/2016 at 4:03 PM  Between 7am to 6pm - Pager - [phone]  After 6pm go to [email or web address] - Proofreader  [facility]  Office  [phone]  CC: Primary care physician; [name], MD   Note: This dictation was prepared with Dragon dictation along with smaller phrase technology. Any transcriptional errors that result from this process are unintentional.

## 2016-09-29 NOTE — ED Triage Notes (Addendum)
Pt ambulatory to triage with steady gait, no distress noted. Pt c/o lower intermittent abdominal pain, headache, fatigue and fever. Pt reports she was discharged from hospital this AM for chest pain observation. Pt sts when she arrived home she started to have these symptoms. Pts also reports she has not had a bowl movement since Friday and lives with someone who has had a recent GI virus. Pt has fever 100.9 in triage.

## 2016-09-30 ENCOUNTER — Encounter: Payer: Self-pay | Admitting: Radiology

## 2016-09-30 DIAGNOSIS — R109 Unspecified abdominal pain: Secondary | ICD-10-CM | POA: Diagnosis not present

## 2016-09-30 MED ORDER — IOPAMIDOL (ISOVUE-300) INJECTION 61%
100.0000 mL | Freq: Once | INTRAVENOUS | Status: AC | PRN
  Administered 2016-09-30: 100 mL via INTRAVENOUS

## 2016-09-30 MED ORDER — POLYETHYLENE GLYCOL 3350 17 G PO PACK: 17.0000 g | PACK | Freq: Every day | ORAL | 0 refills | Status: DC

## 2016-09-30 MED ORDER — ACETAMINOPHEN 500 MG PO TABS
1000.0000 mg | ORAL_TABLET | Freq: Once | ORAL | Status: AC
  Administered 2016-09-30: 1000 mg via ORAL
  Filled 2016-09-30: qty 2

## 2016-09-30 MED ORDER — POLYETHYLENE GLYCOL 3350 17 G PO PACK: 17.0000 g | PACK | Freq: Every day | ORAL | Status: DC

## 2016-09-30 MED ORDER — MAGNESIUM HYDROXIDE 400 MG/5ML PO SUSP: 30.0000 mL | Freq: Every day | ORAL | 0 refills | Status: DC | PRN

## 2016-09-30 NOTE — Discharge Instructions (Addendum)

## 2016-10-01 NOTE — Telephone Encounter (Signed)
Patient is staying with a friend has not returned to group home after hospitalization.

## 2016-10-01 NOTE — Telephone Encounter (Signed)
Transition Care Management Follow-up Telephone Call  How have you been since you were released from the hospital? I felt good, I have been in the house since I left the hospital. Patient denies any chest pain, just has no energy. Patient DX with Virus in the ED on 09/30/16.   Do you understand why you were in the hospital? Yes, chest pain.   Do you understand the discharge instrcutions? Yes,   Items Reviewed:  Medications reviewed: Yes   Allergies reviewed: Yes  Dietary changes reviewed: Yes, Low sodium diet.  Referrals reviewed:Yes   Functional Questionnaire:   Activities of Daily Living (ADLs):   She states they are independent in the following:  Dependent in dressing , clothing and preparing lite meals. States they require assistance with the following: No.   Any transportation issues/concerns?: No.   Any patient concerns? No not really.   Confirmed importance and date/time of follow-up visits scheduled: Yes   Confirmed with patient if condition begins to worsen call PCP or go to the ER.  Patient was given the Call-a-Nurse line [phone]: Yes

## 2016-10-06 ENCOUNTER — Encounter: Payer: Self-pay | Admitting: Family Medicine

## 2016-10-06 ENCOUNTER — Ambulatory Visit (INDEPENDENT_AMBULATORY_CARE_PROVIDER_SITE_OTHER): Payer: Medicare Other | Admitting: Family Medicine

## 2016-10-06 VITALS — BP 130/86 | HR 80 | Temp 98.5°F | Wt 199.2 lb

## 2016-10-06 DIAGNOSIS — Z86711 Personal history of pulmonary embolism: Secondary | ICD-10-CM

## 2016-10-06 DIAGNOSIS — F419 Anxiety disorder, unspecified: Secondary | ICD-10-CM

## 2016-10-06 DIAGNOSIS — F418 Other specified anxiety disorders: Secondary | ICD-10-CM

## 2016-10-06 DIAGNOSIS — F329 Major depressive disorder, single episode, unspecified: Secondary | ICD-10-CM

## 2016-10-06 DIAGNOSIS — F32A Depression, unspecified: Secondary | ICD-10-CM

## 2016-10-06 DIAGNOSIS — Z915 Personal history of self-harm: Secondary | ICD-10-CM

## 2016-10-06 DIAGNOSIS — M940 Chondrocostal junction syndrome [Tietze]: Secondary | ICD-10-CM

## 2016-10-06 DIAGNOSIS — K625 Hemorrhage of anus and rectum: Secondary | ICD-10-CM | POA: Diagnosis not present

## 2016-10-06 NOTE — Patient Instructions (Signed)
Nice to see you. I am glad you are doing better. You likely have costochondritis. You can use heat and ice on your chest for this. You can take Tylenol. Do not take any anti-inflammatories. We will refer you to GI. Please keep your appointment with psychiatry.

## 2016-10-06 NOTE — Progress Notes (Signed)
  [name], MD Phone: [phone]  Valerie Vazquez is a 61 y.o. female who presents today for follow-up.  Patient was recently hospitalized for left-sided chest pain. She was hospitalized from 1/28-1/29. She had one troponin that was borderline positive. She underwent a negative stress test and reassuring echo. Notes continued intermittent left-sided upper chest discomfort. Comes out of nowhere. Not exertional. Notes it is a sharp dull sensation. No shortness of breath or cough with it. She is taking her Eliquis. She does report a history of costochondritis in the past.  Patient is being followed by psychiatry for depression. Notes this is going really well. She is taking Lexapro. Also taking Zyprexa. No SI or HI. Notes that she is in a significantly better place. She is going to discuss her Lexapro and Eliquis in combination with her psychiatrist as there is some increased risk of bleeding when taking these together.  Patient notes no additional blood in her stool. Noted what she saw was dark pink liquid that came out with a bowel movement. She had a negative FOBT in the office. She does note some gray/brown bowel movements though recently had a CT scan that was reassuring with regards to her pancreas. Reports a normal colonoscopy in 2014. Has not been evaluated by GI here.  PMH: Former smoker   ROS see history of present illness  Objective  Physical Exam Vitals:   10/06/16 1139  BP: 130/86  Pulse: 80  Temp: 98.5 F (36.9 C)    BP Readings from Last 3 Encounters:  10/06/16 130/86  09/30/16 111/66  09/29/16 (!) 110/56   Wt Readings from Last 3 Encounters:  10/06/16 199 lb 3.2 oz (90.4 kg)  09/29/16 193 lb (87.5 kg)  09/28/16 193 lb 8 oz (87.8 kg)    Physical Exam  Constitutional: She is well-developed, well-nourished, and in no distress.  HENT:  Head: Atraumatic.  Cardiovascular: Normal rate, regular rhythm and normal heart sounds.   Pulmonary/Chest: Effort  normal and breath sounds normal. She exhibits tenderness (left costochondral joints tender to palpation).  Musculoskeletal: She exhibits no edema.  Neurological: She is alert. Gait normal.  Skin: Skin is warm and dry.  Psychiatric: Mood and affect normal.     Assessment/Plan: Please see individual problem list.  Costochondritis Suspect patient's symptoms were related to costochondritis. Discharge summary has been reviewed. Negative stress test. Unlikely cardiac in nature. Discussed ice and heat for the costochondritis. Tylenol as well. No anti-inflammatories given that she is on Eliquis.  BRBPR (bright red blood per rectum) Patient with episode of dark pink liquid with bowel movement. She had negative FOBT in the office. We'll refer to GI for further evaluation.  Anxiety and depression Significantly improved. Patient is in a good place at this time. I feel that the benefits of taking Lexapro with Eliquis at this time outweigh the risks of taking it given her history of PEs and her history of suicide attempt. She'll discuss further with her psychiatrist. Given return precautions.   Orders Placed This Encounter  Procedures  . Ambulatory referral to Gastroenterology    Referral Priority:   Routine    Referral Type:   Consultation    Referral Reason:   Specialty Services Required    Number of Visits Requested:   1   Medications were reviewed with the patient.  [name], MD [place]

## 2016-10-06 NOTE — Assessment & Plan Note (Signed)
Patient with episode of dark pink liquid with bowel movement. She had negative FOBT in the office. We'll refer to GI for further evaluation.

## 2016-10-06 NOTE — Assessment & Plan Note (Signed)
Suspect patient's symptoms were related to costochondritis. Discharge summary has been reviewed. Negative stress test. Unlikely cardiac in nature. Discussed ice and heat for the costochondritis. Tylenol as well. No anti-inflammatories given that she is on Eliquis.

## 2016-10-06 NOTE — Assessment & Plan Note (Signed)
Significantly improved. Patient is in a good place at this time. I feel that the benefits of taking Lexapro with Eliquis at this time outweigh the risks of taking it given her history of PEs and her history of suicide attempt. She'll discuss further with her psychiatrist. Given return precautions.

## 2016-10-06 NOTE — Progress Notes (Signed)
Pre visit review using our clinic review tool, if applicable. No additional management support is needed unless otherwise documented below in the visit note. 

## 2016-10-08 ENCOUNTER — Encounter: Payer: Self-pay | Admitting: Gastroenterology

## 2016-10-08 DIAGNOSIS — F250 Schizoaffective disorder, bipolar type: Secondary | ICD-10-CM | POA: Diagnosis not present

## 2016-10-10 ENCOUNTER — Telehealth: Payer: Self-pay | Admitting: Gastroenterology

## 2016-10-10 NOTE — Telephone Encounter (Signed)
colonoscopy

## 2016-10-14 ENCOUNTER — Other Ambulatory Visit: Payer: Self-pay | Admitting: Family Medicine

## 2016-10-14 NOTE — Telephone Encounter (Signed)
Zofran Flexeril refilled. Please see if she needs the famotidine. This was prescribed in the ER I believe for her itching.

## 2016-10-14 NOTE — Telephone Encounter (Signed)
Last OV 10/06/16 last filled  Ondansetron 09/25/16 20 0rf Cyclobenzaprine 09/25/16 30 0rf Famotidine 09/12/16 16 0rf

## 2016-10-15 MED ORDER — FAMOTIDINE 20 MG PO TABS: 20.0000 mg | ORAL_TABLET | Freq: Two times a day (BID) | ORAL | 0 refills | Status: DC

## 2016-10-15 NOTE — Telephone Encounter (Signed)
Sent to pharmacy 

## 2016-10-15 NOTE — Telephone Encounter (Signed)
Patient would like a refill on pepcid as well as she is still having itching

## 2016-10-15 NOTE — Telephone Encounter (Signed)
Left message to return call 

## 2016-10-16 IMAGING — CR DG CHEST 1V PORT
1 series · 1 of 1 positions shown · non-contrast
Comparison: October 22, 2014.

CLINICAL DATA: Chest pain.

EXAM:
PORTABLE CHEST - 1 VIEW

[AP]
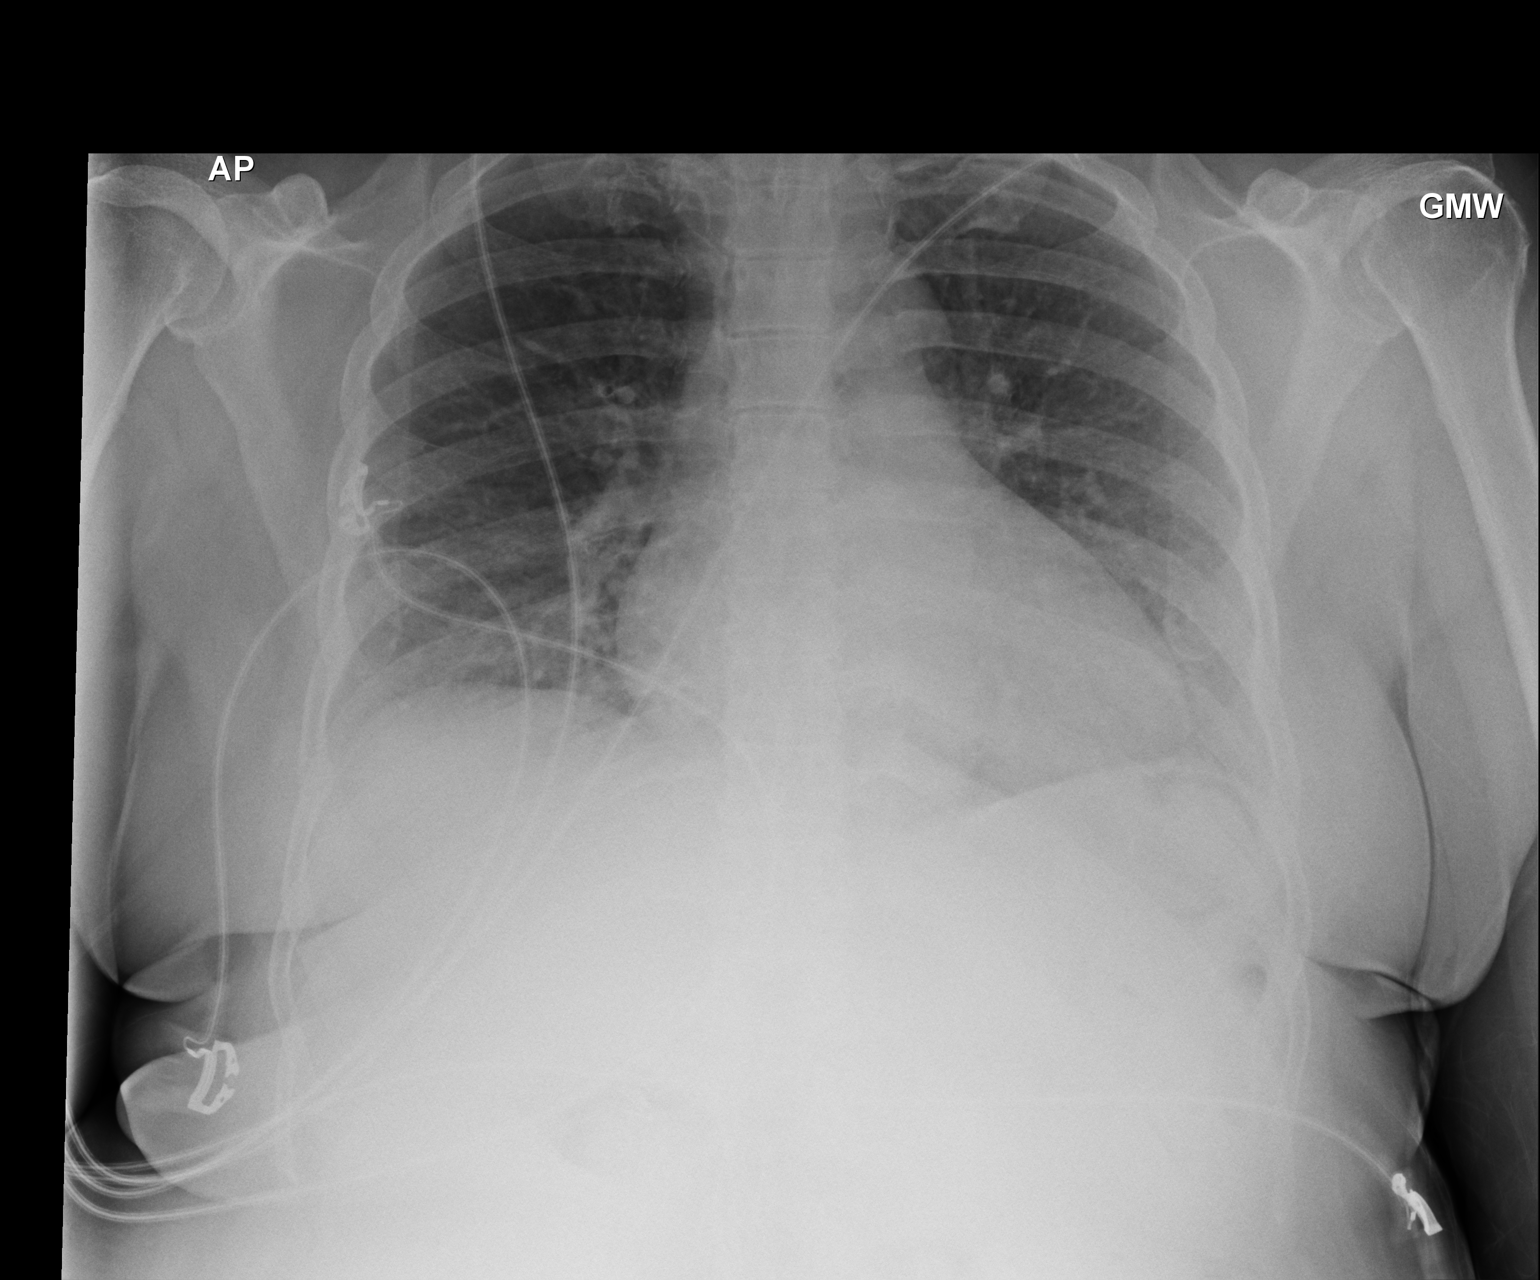

[1 of 1 positions shown; findings below may reference images not displayed]

FINDINGS: The heart size and mediastinal contours are within normal limits.
Both lungs are clear. No pneumothorax or pleural effusion is noted.
The visualized skeletal structures are unremarkable.
IMPRESSION: No acute cardiopulmonary abnormality seen.

## 2016-10-20 ENCOUNTER — Telehealth: Payer: Self-pay | Admitting: Family Medicine

## 2016-10-20 NOTE — Telephone Encounter (Signed)
I called and left a vm to call the office to sch AWV. Thank you!

## 2016-10-23 LAB — NM MYOCAR MULTI W/SPECT W/WALL MOTION / EF
Estimated workload: 1 METS
Exercise duration (min): 4 min
Exercise duration (sec): 13 s
LV dias vol: 74 mL (ref 46–106)
LV sys vol: 27 mL
Peak BP: 108 mmHg
Peak HR: 83 {beats}/min
Percent of predicted max HR: 52 %
Rest HR: 64 {beats}/min
SDS: 0
SRS: 0
SSS: 0
Stage 1 HR: 70 {beats}/min
Stage 2 Grade: 0 %
Stage 2 HR: 69 {beats}/min
Stage 2 Speed: 0 mph
Stage 3 Grade: 0 %
Stage 3 HR: 69 {beats}/min
Stage 3 Speed: 0 mph
Stage 4 DBP: 54 mmHg
Stage 4 Grade: 0 %
Stage 4 HR: 83 {beats}/min
Stage 4 SBP: 108 mmHg
Stage 4 Speed: 0 mph
Stage 5 Grade: 0 %
Stage 5 HR: 86 {beats}/min
Stage 5 Speed: 0 mph
Stage 6 DBP: 61 mmHg
Stage 6 Grade: 0 %
Stage 6 HR: 82 {beats}/min
Stage 6 SBP: 111 mmHg
Stage 6 Speed: 0 mph
TID: 1.13

## 2016-11-01 DIAGNOSIS — M549 Dorsalgia, unspecified: Secondary | ICD-10-CM | POA: Diagnosis not present

## 2016-11-01 DIAGNOSIS — R202 Paresthesia of skin: Secondary | ICD-10-CM | POA: Diagnosis not present

## 2016-11-01 DIAGNOSIS — M5126 Other intervertebral disc displacement, lumbar region: Secondary | ICD-10-CM | POA: Diagnosis not present

## 2016-11-01 DIAGNOSIS — Z87891 Personal history of nicotine dependence: Secondary | ICD-10-CM | POA: Diagnosis not present

## 2016-11-01 DIAGNOSIS — Z7901 Long term (current) use of anticoagulants: Secondary | ICD-10-CM | POA: Diagnosis not present

## 2016-11-01 DIAGNOSIS — E1140 Type 2 diabetes mellitus with diabetic neuropathy, unspecified: Secondary | ICD-10-CM | POA: Diagnosis not present

## 2016-11-01 DIAGNOSIS — M25572 Pain in left ankle and joints of left foot: Secondary | ICD-10-CM | POA: Diagnosis not present

## 2016-11-01 DIAGNOSIS — Z86718 Personal history of other venous thrombosis and embolism: Secondary | ICD-10-CM | POA: Diagnosis not present

## 2016-11-01 DIAGNOSIS — Z5181 Encounter for therapeutic drug level monitoring: Secondary | ICD-10-CM | POA: Diagnosis not present

## 2016-11-01 DIAGNOSIS — M25552 Pain in left hip: Secondary | ICD-10-CM | POA: Diagnosis not present

## 2016-11-01 DIAGNOSIS — R159 Full incontinence of feces: Secondary | ICD-10-CM | POA: Diagnosis not present

## 2016-11-01 DIAGNOSIS — R200 Anesthesia of skin: Secondary | ICD-10-CM | POA: Diagnosis not present

## 2016-11-01 DIAGNOSIS — Z86711 Personal history of pulmonary embolism: Secondary | ICD-10-CM | POA: Diagnosis not present

## 2016-11-01 DIAGNOSIS — R32 Unspecified urinary incontinence: Secondary | ICD-10-CM | POA: Diagnosis not present

## 2016-11-01 DIAGNOSIS — Z96642 Presence of left artificial hip joint: Secondary | ICD-10-CM | POA: Diagnosis not present

## 2016-11-01 DIAGNOSIS — Z79899 Other long term (current) drug therapy: Secondary | ICD-10-CM | POA: Diagnosis not present

## 2016-11-01 DIAGNOSIS — M48061 Spinal stenosis, lumbar region without neurogenic claudication: Secondary | ICD-10-CM | POA: Diagnosis not present

## 2016-11-04 ENCOUNTER — Other Ambulatory Visit: Payer: Self-pay | Admitting: Psychiatry

## 2016-11-04 ENCOUNTER — Other Ambulatory Visit: Payer: Self-pay | Admitting: Family Medicine

## 2016-11-04 NOTE — Telephone Encounter (Signed)
Last OV 10/06/16 last filled  Flexeril 10/14/16 30 0rf imdur 09/16/16 30 1rf

## 2016-11-07 ENCOUNTER — Encounter: Payer: Self-pay | Admitting: Family Medicine

## 2016-11-07 ENCOUNTER — Ambulatory Visit (INDEPENDENT_AMBULATORY_CARE_PROVIDER_SITE_OTHER): Payer: Medicare Other | Admitting: Family Medicine

## 2016-11-07 VITALS — BP 118/64 | HR 80 | Temp 98.4°F | Wt 210.6 lb

## 2016-11-07 DIAGNOSIS — F32A Depression, unspecified: Secondary | ICD-10-CM

## 2016-11-07 DIAGNOSIS — F329 Major depressive disorder, single episode, unspecified: Secondary | ICD-10-CM

## 2016-11-07 DIAGNOSIS — M722 Plantar fascial fibromatosis: Secondary | ICD-10-CM | POA: Insufficient documentation

## 2016-11-07 DIAGNOSIS — F251 Schizoaffective disorder, depressive type: Secondary | ICD-10-CM | POA: Diagnosis not present

## 2016-11-07 DIAGNOSIS — F419 Anxiety disorder, unspecified: Secondary | ICD-10-CM

## 2016-11-07 DIAGNOSIS — F418 Other specified anxiety disorders: Secondary | ICD-10-CM | POA: Diagnosis not present

## 2016-11-07 DIAGNOSIS — F339 Major depressive disorder, recurrent, unspecified: Secondary | ICD-10-CM

## 2016-11-07 DIAGNOSIS — M48061 Spinal stenosis, lumbar region without neurogenic claudication: Secondary | ICD-10-CM | POA: Diagnosis not present

## 2016-11-07 DIAGNOSIS — K219 Gastro-esophageal reflux disease without esophagitis: Secondary | ICD-10-CM

## 2016-11-07 MED ORDER — GABAPENTIN 600 MG PO TABS: 600.0000 mg | ORAL_TABLET | Freq: Two times a day (BID) | ORAL | 1 refills | Status: DC

## 2016-11-07 MED ORDER — PANTOPRAZOLE SODIUM 40 MG PO TBEC: 40.0000 mg | DELAYED_RELEASE_TABLET | Freq: Every day | ORAL | 1 refills | Status: DC

## 2016-11-07 NOTE — Assessment & Plan Note (Signed)
Patient with low back pain. Found to have lumbar spinal stenosis that is moderate in nature. Evaluated by neurosurgery at [facility] and advised physical therapy and Medrol Dosepak. Her symptoms have improved. No red flag symptoms today. Discussed physical therapy referral though she declined. She'll monitor her symptoms for recurrence.

## 2016-11-07 NOTE — Patient Instructions (Addendum)
Nice to see you. Please monitor your back if your symptoms worsen please seek medical attention. Please monitor your depression and if it worsens wheeze let us know. If you develop thoughts of harming herself or others please seek medical attention immediately. You can do the stretches we discussed and ice your left heel for your plantar fasciitis.

## 2016-11-07 NOTE — Progress Notes (Signed)
Pre visit review using our clinic review tool, if applicable. No additional management support is needed unless otherwise documented below in the visit note. 

## 2016-11-07 NOTE — Progress Notes (Signed)
[name], MD Phone: [phone]  Valerie Vazquez is a 61 y.o. female who presents today for follow-up.  Patient was seen in the [facility] emergency room for back pain. She had some urinary retention and stool incontinence. She had an MRI that revealed moderate spinal stenosis though no neurosurgical intervention requirements. Neurosurgery did evaluate the patient and advised that she did not have any surgical needs. They advised Medrol Dosepak and physical therapy. She notes some chronic posterior buttock numbness though no numbness in her legs. Occasionally her legs will give out on her. She notes no recurrent bowel or bladder incontinence. No saddle anesthesia. She has not taken the Medrol Dosepak. Notes her back is better to some degree after receiving steroids in the emergency room. Take gabapentin 600 mg twice daily.  The also diagnosed her with plantar fasciitis on the left foot. Has been better since the steroid shot. Mild soreness now.  Depression: About the same. Can worsen depending on what she is talking about. Taking Lexapro and Zyprexa. No SI or HI. She would like referral to a different psychiatrist given some billing issues at her current psychiatric facility.  Reflux is reportedly well controlled on Protonix. She notes no symptoms when she takes the medicine. She denies blood in her stool. She has not seen GI yet.  PMH: Former smoker   ROS see history of present illness  Objective  Physical Exam Vitals:   11/07/16 1557  BP: 118/64  Pulse: 80  Temp: 98.4 F (36.9 C)    BP Readings from Last 3 Encounters:  11/07/16 118/64  10/06/16 130/86  09/30/16 111/66   Wt Readings from Last 3 Encounters:  11/07/16 210 lb 9.6 oz (95.5 kg)  10/06/16 199 lb 3.2 oz (90.4 kg)  09/29/16 193 lb (87.5 kg)    Physical Exam  Constitutional: No distress.  HENT:  Head: Normocephalic and atraumatic.  Cardiovascular: Normal rate, regular rhythm and normal heart sounds.     Pulmonary/Chest: Effort normal and breath sounds normal.  Abdominal: Soft. She exhibits no distension. There is no tenderness.  Musculoskeletal:  No midline spine tenderness, no midline spine step-off, no muscular back tenderness, mild soreness left posterior plantar fascia area, 2+ DP pulses bilaterally  Neurological: She is alert. Gait normal.  5 out of 5 strength bilateral quads, hamstrings, plantar flexion, and dorsiflexion, sensation to light touch intact bilateral lower extremities  Skin: Skin is warm and dry. She is not diaphoretic.  Psychiatric:  Mood depressed, affect mildly flat     Assessment/Plan: Please see individual problem list.  Schizoaffective disorder, depressive type ([place]) Stable since her last visit. No SI or HI. She'll continue current medications. We will refer to psychiatry.  Lumbar central spinal stenosis (moderate at L4-5; mild at L2-3 and L3-4) Patient with low back pain. Found to have lumbar spinal stenosis that is moderate in nature. Evaluated by neurosurgery at [facility] and advised physical therapy and Medrol Dosepak. Her symptoms have improved. No red flag symptoms today. Discussed physical therapy referral though she declined. She'll monitor her symptoms for recurrence.  Anxiety and depression Essentially stable from previously. Referred to a different psychiatrist.  GERD (gastroesophageal reflux disease) Continue Protonix. Advised that she needs to see GI for her colonoscopy. Offered to have [place] contact them to schedule her an appointment though she declined and said she would contact them.  Plantar fasciitis of left foot Encouraged icing and stretching.   Orders Placed This Encounter  Procedures  . Ambulatory referral to Psychiatry  Referral Priority:   Routine    Referral Type:   Psychiatric    Referral Reason:   Specialty Services Required    Requested Specialty:   Psychiatry    Number of Visits Requested:   1    Meds ordered this encounter   Medications  . gabapentin (NEURONTIN) 600 MG tablet    Sig: Take 1 tablet (600 mg total) by mouth 2 (two) times daily.    Dispense:  180 tablet    Refill:  1  . pantoprazole (PROTONIX) 40 MG tablet    Sig: Take 1 tablet (40 mg total) by mouth daily.    Dispense:  90 tablet    Refill:  1   [name], MD [place]

## 2016-11-07 NOTE — Assessment & Plan Note (Signed)
Encouraged icing and stretching.

## 2016-11-07 NOTE — Assessment & Plan Note (Signed)
Stable since her last visit. No SI or HI. She'll continue current medications. We will refer to psychiatry.

## 2016-11-07 NOTE — Assessment & Plan Note (Signed)
Continue Protonix. Advised that she needs to see GI for her colonoscopy. Offered to have [place] contact them to schedule her an appointment though she declined and said she would contact them.

## 2016-11-07 NOTE — Assessment & Plan Note (Signed)
Essentially stable from previously. Referred to a different psychiatrist.

## 2016-11-11 ENCOUNTER — Encounter: Payer: Self-pay | Admitting: Family

## 2016-11-11 ENCOUNTER — Ambulatory Visit: Payer: Self-pay | Admitting: Family

## 2016-11-11 ENCOUNTER — Emergency Department
Admission: EM | Admit: 2016-11-11 | Discharge: 2016-11-11 | Disposition: A | Discharge status: Home / Self Care | Payer: Medicare Other | Attending: Emergency Medicine | Admitting: Emergency Medicine

## 2016-11-11 ENCOUNTER — Encounter: Payer: Self-pay | Admitting: Emergency Medicine

## 2016-11-11 ENCOUNTER — Ambulatory Visit (INDEPENDENT_AMBULATORY_CARE_PROVIDER_SITE_OTHER): Payer: Medicare Other | Admitting: Family

## 2016-11-11 VITALS — BP 140/70 | HR 96 | Temp 98.5°F | Ht 66.0 in | Wt 213.8 lb

## 2016-11-11 DIAGNOSIS — Z79899 Other long term (current) drug therapy: Secondary | ICD-10-CM | POA: Diagnosis not present

## 2016-11-11 DIAGNOSIS — B9789 Other viral agents as the cause of diseases classified elsewhere: Secondary | ICD-10-CM

## 2016-11-11 DIAGNOSIS — R05 Cough: Secondary | ICD-10-CM | POA: Diagnosis not present

## 2016-11-11 DIAGNOSIS — Z7984 Long term (current) use of oral hypoglycemic drugs: Secondary | ICD-10-CM | POA: Diagnosis not present

## 2016-11-11 DIAGNOSIS — E119 Type 2 diabetes mellitus without complications: Secondary | ICD-10-CM | POA: Diagnosis not present

## 2016-11-11 DIAGNOSIS — J069 Acute upper respiratory infection, unspecified: Secondary | ICD-10-CM | POA: Insufficient documentation

## 2016-11-11 DIAGNOSIS — Z87891 Personal history of nicotine dependence: Secondary | ICD-10-CM | POA: Insufficient documentation

## 2016-11-11 DIAGNOSIS — I10 Essential (primary) hypertension: Secondary | ICD-10-CM | POA: Diagnosis not present

## 2016-11-11 DIAGNOSIS — J40 Bronchitis, not specified as acute or chronic: Secondary | ICD-10-CM | POA: Diagnosis not present

## 2016-11-11 DIAGNOSIS — Z760 Encounter for issue of repeat prescription: Secondary | ICD-10-CM | POA: Diagnosis not present

## 2016-11-11 MED ORDER — HYDROCOD POLST-CPM POLST ER 10-8 MG/5ML PO SUER: 5.0000 mL | Freq: Every evening | ORAL | 0 refills | Status: DC | PRN

## 2016-11-11 MED ORDER — PANTOPRAZOLE SODIUM 40 MG PO TBEC: 40.0000 mg | DELAYED_RELEASE_TABLET | Freq: Every day | ORAL | 0 refills | Status: DC

## 2016-11-11 MED ORDER — CARVEDILOL 3.125 MG PO TABS: 3.1250 mg | ORAL_TABLET | Freq: Two times a day (BID) | ORAL | 0 refills | Status: DC

## 2016-11-11 MED ORDER — BENZONATATE 100 MG PO CAPS: 100.0000 mg | ORAL_CAPSULE | Freq: Three times a day (TID) | ORAL | 0 refills | Status: DC | PRN

## 2016-11-11 NOTE — ED Provider Notes (Signed)
[facility] Emergency Department Provider Note   ____________________________________________   First MD Initiated Contact with Patient 11/11/16 1250     (approximate)  I have reviewed the triage vital signs and the nursing notes.   HISTORY  Chief Complaint Cough    HPI Valerie Vazquez is a 61 y.o. female patient complains coughing spell for 2 days. Patient denies any fever this complaint. Patient also complaining of nasal congestion intermittently rhinorrhea. Patient states cough is constant but worse at night when she laid down. Patient stated no relief over-the-counter cough preparations.Patient denies pain with this complaint.   Past Medical History:  Diagnosis Date  . Anemia   . Bilateral renal cysts   . Chronic back pain    "upper and lower" (09/19/2014)  . Depression    "major" (09/19/2014)  . DVT (deep venous thrombosis) ([place])   . Esophageal spasm   . Gallstones   . Gastritis   . Gastroenteritis   . GERD (gastroesophageal reflux disease)   . Headache    "couple /month lately" (09/19/2014)  . High blood pressure   . High cholesterol   . History of blood transfusion 1985   related to hysterectomy  . Hypertension   . IBS (irritable bowel syndrome)   . Migraine    "a few times/yr" (09/19/2014)  . Myocardial infarction 10/2010 X 3   "while hospitalized"  . Osteoarthritis    "qwhere; mostly around my joints" (09/19/2014)  . Pneumonia 04/2014  . PTSD (post-traumatic stress disorder)   . Pulmonary embolism ([place]) 04/2001; 09/19/2014   "after gallbladder OR; "  . Schizoaffective disorder ([place])   . Sleep apnea    "mild" (09/19/2014)  . Snoring    a. sleep study 5/16: No OSA  . Spinal stenosis   . Spondylosis   . Type II diabetes mellitus ([place])    "dx'd in 2007; lost weight; no RX for ~ 4 yr now" (09/19/2014)    Patient Active Problem List   Diagnosis Date Noted  . Plantar fasciitis of left foot 11/07/2016  . Costochondritis  10/06/2016  . BRBPR (bright red blood per rectum) 10/06/2016  . CAD (coronary artery disease) 09/28/2016  . Diarrhea 09/16/2016  . Epigastric discomfort 09/16/2016  . Schizoaffective disorder, depressive type ([place]) 08/28/2016  . Suicide attempt 08/27/2016  . Colitis 04/08/2016  . Absolute anemia 01/29/2016  . Chronic shoulder pain (Location of Primary Source of Pain) (Left) 01/29/2016  . Osteoarthritis of hip (Bilateral) 01/29/2016  . Lumbar central spinal stenosis (moderate at L4-5; mild at L2-3 and L3-4) 01/29/2016  . Hx of cervical spine surgery 01/29/2016  . Numbness 11/19/2015  . Breast pain 10/19/2015  . Chest pain 10/14/2015  . Shortness of breath 07/31/2015  . Pleural effusion, bilateral 07/31/2015  . Esophageal spasm 06/05/2015  . History of DVT (deep vein thrombosis) 02/25/2015  . 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 Constipation 12/12/2014  . Coronary vasospasm ([place]) 12/09/2014  . History of pulmonary embolus (PE) 09/19/2014  . Anxiety and depression 04/13/2014  . IBS (irritable bowel syndrome) 04/11/2014  . GERD (gastroesophageal reflux disease) 04/11/2014  . Insomnia 04/11/2014  . Essential hypertension, benign 10/28/2013  . Anemia 10/28/2013  . HLD (hyperlipidemia) 10/28/2013    Past Surgical History:  Procedure Laterality Date  . ABDOMINAL HYSTERECTOMY  1985  . ANTERIOR CERVICAL DECOMP/DISCECTOMY FUSION  10/2012  . APPENDECTOMY  1985  . BACK SURGERY    . BREAST CYST EXCISION Right   . BREAST EXCISIONAL BIOPSY Right YRS AGO  NEG  . CARDIAC CATHETERIZATION   2000's; 2009  . CHOLECYSTECTOMY  2002  . EXCISION/RELEASE BURSA HIP Right 12/1984  . HERNIA REPAIR  1985  . LEFT HEART CATHETERIZATION WITH CORONARY ANGIOGRAM N/A 12/09/2014   Procedure: LEFT HEART CATHETERIZATION WITH CORONARY ANGIOGRAM;  Surgeon: Burnell Blanks, MD;  Location: [facility] CATH LAB;  Service: Cardiovascular;  Laterality: N/A;  . TONSILLECTOMY AND ADENOIDECTOMY  ~ 1968  . TOTAL HIP ARTHROPLASTY  Left 04-06-2014  . UMBILICAL HERNIA REPAIR  1985  . VENA CAVA FILTER PLACEMENT  03/2014    Prior to Admission medications   Medication Sig Start Date End Date Taking? Authorizing Provider  albuterol (PROVENTIL HFA;VENTOLIN HFA) 108 (90 Base) MCG/ACT inhaler Inhale 2 puffs into the lungs every 6 (six) hours as needed for wheezing or shortness of breath. 09/03/16   Clovis Fredrickson, MD  apixaban (ELIQUIS) 5 MG TABS tablet Take 1 tablet (5 mg total) by mouth 2 (two) times daily. 09/25/16   [name], MD  benzonatate (TESSALON PERLES) 100 MG capsule Take 1 capsule (100 mg total) by mouth 3 (three) times daily as needed for cough. 11/11/16 11/11/17  Sable Feil, PA-C  carvedilol (COREG) 3.125 MG tablet Take 1 tablet (3.125 mg total) by mouth 2 (two) times daily with a meal. 09/03/16   Jolanta B Pucilowska, MD  chlorpheniramine-HYDROcodone (TUSSIONEX PENNKINETIC ER) 10-8 MG/5ML SUER Take 5 mLs by mouth at bedtime as needed for cough. 11/11/16   Sable Feil, PA-C  cyclobenzaprine (FLEXERIL) 10 MG tablet TAKE 0.5 TABLETS (5 MG TOTAL) BY MOUTH 3 (THREE) TIMES DAILY AS NEEDED FOR MUSCLE SPASMS. 11/04/16   [name], MD  dicyclomine (BENTYL) 20 MG tablet Take 1 tablet (20 mg total) by mouth 3 (three) times daily as needed for spasms. 09/16/16 09/16/17  [name], MD  diltiazem (CARDIZEM SR) 60 MG 12 hr capsule Take 1 capsule (60 mg total) by mouth every 12 (twelve) hours. 09/03/16   Clovis Fredrickson, MD  diphenhydrAMINE (BENADRYL) 25 MG tablet Take 1 tablet (25 mg total) by mouth every 6 (six) hours. 09/12/16   Jola Schmidt, MD  escitalopram (LEXAPRO) 10 MG tablet Take 15 mg by mouth daily.    Historical Provider, MD  famotidine (PEPCID) 20 MG tablet Take 1 tablet (20 mg total) by mouth 2 (two) times daily. 10/15/16   [name], MD  furosemide (LASIX) 20 MG tablet Take 1 tablet (20 mg total) by mouth daily. 09/04/16   Clovis Fredrickson, MD  gabapentin (NEURONTIN) 600 MG tablet  Take 1 tablet (600 mg total) by mouth 2 (two) times daily. 11/07/16   [name], MD  isosorbide mononitrate (IMDUR) 30 MG 24 hr tablet TAKE 1 TABLET BY MOUTH EVERY DAY 11/04/16   [name], MD  magnesium hydroxide (MILK OF MAGNESIA) 400 MG/5ML suspension Take 30 mLs by mouth daily as needed for mild constipation. 09/30/16 09/30/17  Merlyn Lot, MD  OLANZapine (ZYPREXA) 10 MG tablet Take 1 tablet (10 mg total) by mouth at bedtime. 09/03/16   Jolanta B Pucilowska, MD  ondansetron (ZOFRAN) 4 MG tablet TAKE 1 TABLET (4 MG TOTAL) BY MOUTH EVERY 8 (EIGHT) HOURS AS NEEDED FOR NAUSEA OR VOMITING. 10/14/16   [name], MD  pantoprazole (PROTONIX) 40 MG tablet Take 1 tablet (40 mg total) by mouth daily. 11/07/16   [name], MD  polyethylene glycol [facility] / Floria Raveling) packet Take 17 g by mouth daily. Mix one tablespoon  with 8oz of your favorite juice or water every day until you are having soft formed stools. Then start taking once daily if you didn't have a stool the day before. 09/30/16   Merlyn Lot, MD  potassium chloride SA (K-DUR,KLOR-CON) 10 MEQ tablet Take 1 tablet (10 mEq total) by mouth daily. 09/04/16   Clovis Fredrickson, MD  traZODone (DESYREL) 100 MG tablet Take 200 mg by mouth at bedtime as needed for sleep.    Historical Provider, MD  triamcinolone cream (KENALOG) 0.1 % Apply 1 application topically 2 (two) times daily. 09/13/16   Gloriann Loan, PA-C    Allergies Abilify [aripiprazole]; Augmentin [amoxicillin-pot clavulanate]; Bactrim [sulfamethoxazole-trimethoprim]; Ceftin [cefuroxime axetil]; Coconut oil; Diclofenac; Dye fdc red [red dye]; Indocin [indomethacin]; Linaclotide; Lisinopril; Morphine; Morphine and related; Simvastatin; Sulfa antibiotics; Sulfamethoxazole-trimethoprim; Wellbutrin [bupropion]; and Quetiapine fumarate  Family History  Problem Relation Age of Onset  . Stroke Mother     Deceased  . Lung cancer Father     Deceased  . Healthy Daughter     . Healthy Son     x 2  . Other Son     Suicide  . Heart attack Neg Hx     Social History Social History  Substance Use Topics  . Smoking status: Former Smoker    Packs/day: 1.50    Years: 10.00    Types: Cigarettes    Quit date: 04/12/1979  . Smokeless tobacco: Never Used  . Alcohol use No    Review of Systems Constitutional: No fever/chills Eyes: No visual changes. ENT: No sore throat. Cardiovascular: Denies chest pain. Respiratory: Denies shortness of breath. Nonproductive cough Gastrointestinal: No abdominal pain.  No nausea, no vomiting.  No diarrhea.  No constipation. Genitourinary: Negative for dysuria. Musculoskeletal: Negative for back pain. Skin: Negative for rash. Neurological: Negative for headaches, focal weakness or numbness. Psychiatric:Depression, PTSD, and schizoaffective disorder Endocrine:Hyperlipidemia, hypertension, and diabetes. Hematological/Lymphatic: Allergic/Immunilogical: See extensive medication list ____________________________________________   PHYSICAL EXAM:  VITAL SIGNS: ED Triage Vitals  Enc Vitals Group     BP 11/11/16 1214 (!) 151/87     Pulse Rate 11/11/16 1214 87     Resp 11/11/16 1214 16     Temp 11/11/16 1214 98.1 F (36.7 C)     Temp Source 11/11/16 1214 Oral     SpO2 11/11/16 1214 97 %     Weight 11/11/16 1210 210 lb (95.3 kg)     Height 11/11/16 1210 5\' 6"  (1.676 m)     Head Circumference --      Peak Flow --      Pain Score --      Pain Loc --      Pain Edu? --      Excl. in [place]? --     Constitutional: Alert and oriented. Well appearing and in no acute distress. Eyes: Conjunctivae are normal. PERRL. EOMI. Head: Atraumatic. Nose: Edematous nasal turbinates clear rhinorrhea Mouth/Throat: Mucous membranes are moist.  Oropharynx non-erythematous. Postnasal drainage Neck: No stridor.  No cervical spine tenderness to palpation. Hematological/Lymphatic/Immunilogical: No cervical lymphadenopathy. Cardiovascular:  Normal rate, regular rhythm. Grossly normal heart sounds.  Good peripheral circulation. We'll get a blood pressure Respiratory: Normal respiratory effort.  No retractions. Lungs CTAB. Nonproductive cough Gastrointestinal: Soft and nontender. No distention. No abdominal bruits. No CVA tenderness. Musculoskeletal: No lower extremity tenderness nor edema.  No joint effusions. Neurologic:  Normal speech and language. No gross focal neurologic deficits are appreciated. No gait instability. Skin:  Skin is warm, dry  and intact. No rash noted. Psychiatric: Mood and affect are normal. Speech and behavior are normal.  ____________________________________________   LABS (all labs ordered are listed, but only abnormal results are displayed)  Labs Reviewed - No data to display ____________________________________________  EKG   ____________________________________________  RADIOLOGY   ____________________________________________   PROCEDURES  Procedure(s) performed: None  Procedures  Critical Care performed: No  ____________________________________________   INITIAL IMPRESSION / ASSESSMENT AND PLAN / ED COURSE  Pertinent labs & imaging results that were available during my care of the patient were reviewed by me and considered in my medical decision making (see chart for details).  Upper respiratory infection. Patient given discharge care instructions. Patient given his prescription for Tessalon. Patient given prescription for this and see use at night before retiring for bed. Patient advised follow-up family doctor condition does not improve in 3-5 days.      ____________________________________________   FINAL CLINICAL IMPRESSION(S) / ED DIAGNOSES  Final diagnoses:  Viral URI with cough      NEW MEDICATIONS STARTED DURING THIS VISIT:  New Prescriptions   BENZONATATE (TESSALON PERLES) 100 MG CAPSULE    Take 1 capsule (100 mg total) by mouth 3 (three) times daily as  needed for cough.   CHLORPHENIRAMINE-HYDROCODONE (TUSSIONEX PENNKINETIC ER) 10-8 MG/5ML SUER    Take 5 mLs by mouth at bedtime as needed for cough.     Note:  This document was prepared using Dragon voice recognition software and may include unintentional dictation errors.    Sable Feil, PA-C 11/11/16 [place], MD 11/11/16 [phone]

## 2016-11-11 NOTE — Progress Notes (Signed)
Subjective:    Patient ID: Valerie Vazquez, female    DOB: 1956-07-15, 61 y.o.   MRN: [id number]  CC: Valerie Vazquez is a 61 y.o. female who presents today for an acute visit.    HPI: Chief complaint cough and congestion started 2 days ago, worsening. Endorses wheezing. Has been inhaler with relief. No SOB, fever. She's tried Nasonex, Mucinex liquid and pills with no relief. No CP, palpitations.   Was 'coughing so bad' decided to go to ER.  Was seen in emergency room today with diagnosis of viral URI given Tessalon. And tussionex - went to pharmacy and tussionex $81 and decided to come here hoping that she get a "shot" for her cough. Has an equate brand cough syrup which works.  H/o PNA. No smoking.   Living out of car; homeless.       HISTORY:  Past Medical History:  Diagnosis Date  . Anemia   . Bilateral renal cysts   . Chronic back pain    "upper and lower" (09/19/2014)  . Depression    "major" (09/19/2014)  . DVT (deep venous thrombosis) ([place])   . Esophageal spasm   . Gallstones   . Gastritis   . Gastroenteritis   . GERD (gastroesophageal reflux disease)   . Headache    "couple /month lately" (09/19/2014)  . High blood pressure   . High cholesterol   . History of blood transfusion 1985   related to hysterectomy  . Hypertension   . IBS (irritable bowel syndrome)   . Migraine    "a few times/yr" (09/19/2014)  . Myocardial infarction 10/2010 X 3   "while hospitalized"  . Osteoarthritis    "qwhere; mostly around my joints" (09/19/2014)  . Pneumonia 04/2014  . PTSD (post-traumatic stress disorder)   . Pulmonary embolism ([place]) 04/2001; 09/19/2014   "after gallbladder OR; "  . Schizoaffective disorder ([place])   . Sleep apnea    "mild" (09/19/2014)  . Snoring    a. sleep study 5/16: No OSA  . Spinal stenosis   . Spondylosis   . Type II diabetes mellitus ([place])    "dx'd in 2007; lost weight; no RX for ~ 4 yr now" (09/19/2014)   Past Surgical History:    Procedure Laterality Date  . ABDOMINAL HYSTERECTOMY  1985  . ANTERIOR CERVICAL DECOMP/DISCECTOMY FUSION  10/2012  . APPENDECTOMY  1985  . BACK SURGERY    . BREAST CYST EXCISION Right   . BREAST EXCISIONAL BIOPSY Right YRS AGO   NEG  . CARDIAC CATHETERIZATION   2000's; 2009  . CHOLECYSTECTOMY  2002  . EXCISION/RELEASE BURSA HIP Right 12/1984  . HERNIA REPAIR  1985  . LEFT HEART CATHETERIZATION WITH CORONARY ANGIOGRAM N/A 12/09/2014   Procedure: LEFT HEART CATHETERIZATION WITH CORONARY ANGIOGRAM;  Surgeon: Burnell Blanks, MD;  Location: [facility] CATH LAB;  Service: Cardiovascular;  Laterality: N/A;  . TONSILLECTOMY AND ADENOIDECTOMY  ~ 1968  . TOTAL HIP ARTHROPLASTY Left 04-06-2014  . UMBILICAL HERNIA REPAIR  1985  . VENA CAVA FILTER PLACEMENT  03/2014   Family History  Problem Relation Age of Onset  . Stroke Mother     Deceased  . Lung cancer Father     Deceased  . Healthy Daughter   . Healthy Son     x 2  . Other Son     Suicide  . Heart attack Neg Hx     Allergies: Abilify [aripiprazole]; Augmentin [amoxicillin-pot clavulanate]; Bactrim [sulfamethoxazole-trimethoprim]; Ceftin [cefuroxime axetil];  Coconut oil; Diclofenac; Dye fdc red [red dye]; Indocin [indomethacin]; Linaclotide; Lisinopril; Morphine; Morphine and related; Simvastatin; Sulfa antibiotics; Sulfamethoxazole-trimethoprim; Wellbutrin [bupropion]; and Quetiapine fumarate Current Outpatient Prescriptions on File Prior to Visit  Medication Sig Dispense Refill  . albuterol (PROVENTIL HFA;VENTOLIN HFA) 108 (90 Base) MCG/ACT inhaler Inhale 2 puffs into the lungs every 6 (six) hours as needed for wheezing or shortness of breath. 1 Inhaler 2  . apixaban (ELIQUIS) 5 MG TABS tablet Take 1 tablet (5 mg total) by mouth 2 (two) times daily. 60 tablet 5  . benzonatate (TESSALON PERLES) 100 MG capsule Take 1 capsule (100 mg total) by mouth 3 (three) times daily as needed for cough. 30 capsule 0  . chlorpheniramine-HYDROcodone  (TUSSIONEX PENNKINETIC ER) 10-8 MG/5ML SUER Take 5 mLs by mouth at bedtime as needed for cough. 115 mL 0  . cyclobenzaprine (FLEXERIL) 10 MG tablet TAKE 0.5 TABLETS (5 MG TOTAL) BY MOUTH 3 (THREE) TIMES DAILY AS NEEDED FOR MUSCLE SPASMS. 30 tablet 0  . dicyclomine (BENTYL) 20 MG tablet Take 1 tablet (20 mg total) by mouth 3 (three) times daily as needed for spasms. 90 tablet 1  . diltiazem (CARDIZEM SR) 60 MG 12 hr capsule Take 1 capsule (60 mg total) by mouth every 12 (twelve) hours. 60 capsule 1  . diphenhydrAMINE (BENADRYL) 25 MG tablet Take 1 tablet (25 mg total) by mouth every 6 (six) hours. 12 tablet 0  . escitalopram (LEXAPRO) 10 MG tablet Take 15 mg by mouth daily.    . famotidine (PEPCID) 20 MG tablet Take 1 tablet (20 mg total) by mouth 2 (two) times daily. 16 tablet 0  . furosemide (LASIX) 20 MG tablet Take 1 tablet (20 mg total) by mouth daily. 30 tablet 1  . gabapentin (NEURONTIN) 600 MG tablet Take 1 tablet (600 mg total) by mouth 2 (two) times daily. 180 tablet 1  . isosorbide mononitrate (IMDUR) 30 MG 24 hr tablet TAKE 1 TABLET BY MOUTH EVERY DAY 30 tablet 5  . magnesium hydroxide (MILK OF MAGNESIA) 400 MG/5ML suspension Take 30 mLs by mouth daily as needed for mild constipation. 360 mL 0  . OLANZapine (ZYPREXA) 10 MG tablet Take 1 tablet (10 mg total) by mouth at bedtime. 30 tablet 1  . ondansetron (ZOFRAN) 4 MG tablet TAKE 1 TABLET (4 MG TOTAL) BY MOUTH EVERY 8 (EIGHT) HOURS AS NEEDED FOR NAUSEA OR VOMITING. 20 tablet 0  . polyethylene glycol (MIRALAX / GLYCOLAX) packet Take 17 g by mouth daily. Mix one tablespoon with 8oz of your favorite juice or water every day until you are having soft formed stools. Then start taking once daily if you didn't have a stool the day before. 30 each 0  . potassium chloride SA (K-DUR,KLOR-CON) 10 MEQ tablet Take 1 tablet (10 mEq total) by mouth daily. 30 tablet 1  . traZODone (DESYREL) 100 MG tablet Take 200 mg by mouth at bedtime as needed for sleep.     Marland Kitchen triamcinolone cream (KENALOG) 0.1 % Apply 1 application topically 2 (two) times daily. 30 g 0   No current facility-administered medications on file prior to visit.     Social History  Substance Use Topics  . Smoking status: Former Smoker    Packs/day: 1.50    Years: 10.00    Types: Cigarettes    Quit date: 04/12/1979  . Smokeless tobacco: Never Used  . Alcohol use No    Review of Systems  Constitutional: Negative for chills and fever.  HENT: Positive  for congestion.   Respiratory: Positive for cough.   Cardiovascular: Negative for chest pain and palpitations.  Gastrointestinal: Negative for nausea and vomiting.      Objective:    BP 140/70   Pulse 96   Temp 98.5 F (36.9 C) (Oral)   Ht 5\' 6"  (1.676 m)   Wt 213 lb 12.8 oz (97 kg)   SpO2 96%   BMI 34.51 kg/m    Physical Exam  Constitutional: She appears well-developed and well-nourished.  HENT:  Head: Normocephalic and atraumatic.  Right Ear: Hearing, tympanic membrane, external ear and ear canal normal. No drainage, swelling or tenderness. No foreign bodies. Tympanic membrane is not erythematous and not bulging. No middle ear effusion. No decreased hearing is noted.  Left Ear: Hearing, tympanic membrane, external ear and ear canal normal. No drainage, swelling or tenderness. No foreign bodies. Tympanic membrane is not erythematous and not bulging.  No middle ear effusion. No decreased hearing is noted.  Nose: Nose normal. No rhinorrhea. Right sinus exhibits no maxillary sinus tenderness and no frontal sinus tenderness. Left sinus exhibits no maxillary sinus tenderness and no frontal sinus tenderness.  Mouth/Throat: Uvula is midline, oropharynx is clear and moist and mucous membranes are normal. No oropharyngeal exudate, posterior oropharyngeal edema, posterior oropharyngeal erythema or tonsillar abscesses.  Eyes: Conjunctivae are normal.  Cardiovascular: Regular rhythm, normal heart sounds and normal pulses.     Pulmonary/Chest: Effort normal and breath sounds normal. She has no wheezes. She has no rhonchi. She has no rales.  Lymphadenopathy:       Head (right side): No submental, no submandibular, no tonsillar, no preauricular, no posterior auricular and no occipital adenopathy present.       Head (left side): No submental, no submandibular, no tonsillar, no preauricular, no posterior auricular and no occipital adenopathy present.    She has no cervical adenopathy.  Neurological: She is alert.  Skin: Skin is warm and dry.  Psychiatric: She has a normal mood and affect. Her speech is normal and behavior is normal. Thought content normal.  Vitals reviewed.      Assessment & Plan:  1. Medication refill Refilled as requested.  - carvedilol (COREG) 3.125 MG tablet; Take 1 tablet (3.125 mg total) by mouth 2 (two) times daily with a meal.  Dispense: 60 tablet; Refill: 0 - pantoprazole (PROTONIX) 40 MG tablet; Take 1 tablet (40 mg total) by mouth daily.  Dispense: 60 tablet; Refill: 0  2. Bronchitis Based on duration of symptoms, I explained to patient likely viral illness. Patient is in no acute respiratory distress. She is afebrile. I explained to her I don't unfortunately have a "shot" that I can give her. We discussed at length holistic remedies including honey, herbal teas which may help control her cough symptoms. She had bought an equate brand over-the-counter cough medication which worked and we jointly agreed that she would use this. She will let me know if she's not better.     I am having [name] maintain her albuterol, diltiazem, furosemide, OLANZapine, potassium chloride, diphenhydrAMINE, triamcinolone cream, dicyclomine, apixaban, traZODone, escitalopram, polyethylene glycol, magnesium hydroxide, ondansetron, famotidine, cyclobenzaprine, isosorbide mononitrate, gabapentin, benzonatate, chlorpheniramine-HYDROcodone, carvedilol, and pantoprazole.   Meds ordered this encounter   Medications  . carvedilol (COREG) 3.125 MG tablet    Sig: Take 1 tablet (3.125 mg total) by mouth 2 (two) times daily with a meal.    Dispense:  60 tablet    Refill:  0    Order Specific Question:  Supervising Provider    Answer:   Crecencio Mc [[id number]]  . pantoprazole (PROTONIX) 40 MG tablet    Sig: Take 1 tablet (40 mg total) by mouth daily.    Dispense:  60 tablet    Refill:  0    Order Specific Question:   Supervising Provider    Answer:   Crecencio Mc [[id number]]    Return precautions given.   Risks, benefits, and alternatives of the medications and treatment plan prescribed today were discussed, and patient expressed understanding.   Education regarding symptom management and diagnosis given to patient on AVS.  Continue to follow with [name], MD for routine health maintenance.   Valerie Vazquez and I agreed with plan.   Mable Paris, FNP

## 2016-11-11 NOTE — Progress Notes (Deleted)
Subjective:    Patient ID: Valerie Vazquez, female    DOB: 28-Oct-1955, 61 y.o.   MRN: [id number]  CC: Valerie Vazquez is a 60 y.o. female who presents today for an acute visit.    HPI: CC: cough      HISTORY:  Past Medical History:  Diagnosis Date  . Anemia   . Bilateral renal cysts   . Chronic back pain    "upper and lower" (09/19/2014)  . Depression    "major" (09/19/2014)  . DVT (deep venous thrombosis) ([place])   . Esophageal spasm   . Gallstones   . Gastritis   . Gastroenteritis   . GERD (gastroesophageal reflux disease)   . Headache    "couple /month lately" (09/19/2014)  . High blood pressure   . High cholesterol   . History of blood transfusion 1985   related to hysterectomy  . Hypertension   . IBS (irritable bowel syndrome)   . Migraine    "a few times/yr" (09/19/2014)  . Myocardial infarction 10/2010 X 3   "while hospitalized"  . Osteoarthritis    "qwhere; mostly around my joints" (09/19/2014)  . Pneumonia 04/2014  . PTSD (post-traumatic stress disorder)   . Pulmonary embolism ([place]) 04/2001; 09/19/2014   "after gallbladder OR; "  . Schizoaffective disorder ([place])   . Sleep apnea    "mild" (09/19/2014)  . Snoring    a. sleep study 5/16: No OSA  . Spinal stenosis   . Spondylosis   . Type II diabetes mellitus ([place])    "dx'd in 2007; lost weight; no RX for ~ 4 yr now" (09/19/2014)   Past Surgical History:  Procedure Laterality Date  . ABDOMINAL HYSTERECTOMY  1985  . ANTERIOR CERVICAL DECOMP/DISCECTOMY FUSION  10/2012  . APPENDECTOMY  1985  . BACK SURGERY    . BREAST CYST EXCISION Right   . BREAST EXCISIONAL BIOPSY Right YRS AGO   NEG  . CARDIAC CATHETERIZATION   2000's; 2009  . CHOLECYSTECTOMY  2002  . EXCISION/RELEASE BURSA HIP Right 12/1984  . HERNIA REPAIR  1985  . LEFT HEART CATHETERIZATION WITH CORONARY ANGIOGRAM N/A 12/09/2014   Procedure: LEFT HEART CATHETERIZATION WITH CORONARY ANGIOGRAM;  Surgeon: Burnell Blanks, MD;  Location:  [facility] CATH LAB;  Service: Cardiovascular;  Laterality: N/A;  . TONSILLECTOMY AND ADENOIDECTOMY  ~ 1968  . TOTAL HIP ARTHROPLASTY Left 04-06-2014  . UMBILICAL HERNIA REPAIR  1985  . VENA CAVA FILTER PLACEMENT  03/2014   Family History  Problem Relation Age of Onset  . Stroke Mother     Deceased  . Lung cancer Father     Deceased  . Healthy Daughter   . Healthy Son     x 2  . Other Son     Suicide  . Heart attack Neg Hx     Allergies: Abilify [aripiprazole]; Augmentin [amoxicillin-pot clavulanate]; Bactrim [sulfamethoxazole-trimethoprim]; Ceftin [cefuroxime axetil]; Coconut oil; Diclofenac; Dye fdc red [red dye]; Indocin [indomethacin]; Linaclotide; Lisinopril; Morphine; Morphine and related; Simvastatin; Sulfa antibiotics; Sulfamethoxazole-trimethoprim; Wellbutrin [bupropion]; and Quetiapine fumarate Current Outpatient Prescriptions on File Prior to Visit  Medication Sig Dispense Refill  . albuterol (PROVENTIL HFA;VENTOLIN HFA) 108 (90 Base) MCG/ACT inhaler Inhale 2 puffs into the lungs every 6 (six) hours as needed for wheezing or shortness of breath. 1 Inhaler 2  . apixaban (ELIQUIS) 5 MG TABS tablet Take 1 tablet (5 mg total) by mouth 2 (two) times daily. 60 tablet 5  . carvedilol (COREG) 3.125 MG tablet Take  1 tablet (3.125 mg total) by mouth 2 (two) times daily with a meal. 60 tablet 1  . cyclobenzaprine (FLEXERIL) 10 MG tablet TAKE 0.5 TABLETS (5 MG TOTAL) BY MOUTH 3 (THREE) TIMES DAILY AS NEEDED FOR MUSCLE SPASMS. 30 tablet 0  . dicyclomine (BENTYL) 20 MG tablet Take 1 tablet (20 mg total) by mouth 3 (three) times daily as needed for spasms. 90 tablet 1  . diltiazem (CARDIZEM SR) 60 MG 12 hr capsule Take 1 capsule (60 mg total) by mouth every 12 (twelve) hours. 60 capsule 1  . diphenhydrAMINE (BENADRYL) 25 MG tablet Take 1 tablet (25 mg total) by mouth every 6 (six) hours. 12 tablet 0  . escitalopram (LEXAPRO) 10 MG tablet Take 15 mg by mouth daily.    . famotidine (PEPCID) 20 MG  tablet Take 1 tablet (20 mg total) by mouth 2 (two) times daily. 16 tablet 0  . furosemide (LASIX) 20 MG tablet Take 1 tablet (20 mg total) by mouth daily. 30 tablet 1  . gabapentin (NEURONTIN) 600 MG tablet Take 1 tablet (600 mg total) by mouth 2 (two) times daily. 180 tablet 1  . isosorbide mononitrate (IMDUR) 30 MG 24 hr tablet TAKE 1 TABLET BY MOUTH EVERY DAY 30 tablet 5  . magnesium hydroxide (MILK OF MAGNESIA) 400 MG/5ML suspension Take 30 mLs by mouth daily as needed for mild constipation. 360 mL 0  . OLANZapine (ZYPREXA) 10 MG tablet Take 1 tablet (10 mg total) by mouth at bedtime. 30 tablet 1  . ondansetron (ZOFRAN) 4 MG tablet TAKE 1 TABLET (4 MG TOTAL) BY MOUTH EVERY 8 (EIGHT) HOURS AS NEEDED FOR NAUSEA OR VOMITING. 20 tablet 0  . pantoprazole (PROTONIX) 40 MG tablet Take 1 tablet (40 mg total) by mouth daily. 90 tablet 1  . polyethylene glycol (MIRALAX / GLYCOLAX) packet Take 17 g by mouth daily. Mix one tablespoon with 8oz of your favorite juice or water every day until you are having soft formed stools. Then start taking once daily if you didn't have a stool the day before. 30 each 0  . potassium chloride SA (K-DUR,KLOR-CON) 10 MEQ tablet Take 1 tablet (10 mEq total) by mouth daily. 30 tablet 1  . traZODone (DESYREL) 100 MG tablet Take 200 mg by mouth at bedtime as needed for sleep.    Marland Kitchen triamcinolone cream (KENALOG) 0.1 % Apply 1 application topically 2 (two) times daily. 30 g 0   No current facility-administered medications on file prior to visit.     Social History  Substance Use Topics  . Smoking status: Former Smoker    Packs/day: 1.50    Years: 10.00    Types: Cigarettes    Quit date: 04/12/1979  . Smokeless tobacco: Never Used  . Alcohol use No    Review of Systems    Objective:    There were no vitals taken for this visit.   Physical Exam     Assessment & Plan:      I am having Ms. Valerie Vazquez maintain her albuterol, carvedilol, diltiazem, furosemide,  OLANZapine, potassium chloride, diphenhydrAMINE, triamcinolone cream, dicyclomine, apixaban, traZODone, escitalopram, polyethylene glycol, magnesium hydroxide, ondansetron, famotidine, cyclobenzaprine, isosorbide mononitrate, gabapentin, and pantoprazole.   No orders of the defined types were placed in this encounter.   Return precautions given.   Risks, benefits, and alternatives of the medications and treatment plan prescribed today were discussed, and patient expressed understanding.   Education regarding symptom management and diagnosis given to patient on AVS.  Continue  to follow with [name], MD for routine health maintenance.   Valerie Vazquez and I agreed with plan.   Mable Paris, FNP

## 2016-11-11 NOTE — Patient Instructions (Signed)
As discussed , lets treat conservatively Warm drinks, honey are also good for cough.  Let us know if you worsen any way.

## 2016-11-11 NOTE — ED Notes (Signed)

## 2016-11-11 NOTE — ED Triage Notes (Signed)
C/O cough x 2 days.  Denies fever.

## 2016-11-11 NOTE — ED Notes (Signed)
See triage note  Presents with cough for couple of day  Afebrile  Cough is non prod

## 2016-11-11 NOTE — Progress Notes (Signed)
Pre visit review using our clinic review tool, if applicable. No additional management support is needed unless otherwise documented below in the visit note. 

## 2016-11-12 ENCOUNTER — Encounter: Payer: Self-pay | Admitting: Family

## 2016-11-13 ENCOUNTER — Telehealth: Payer: Self-pay | Admitting: Family Medicine

## 2016-11-13 MED ORDER — GABAPENTIN 600 MG PO TABS: 600.0000 mg | ORAL_TABLET | Freq: Two times a day (BID) | ORAL | 1 refills | Status: DC

## 2016-11-13 NOTE — Telephone Encounter (Signed)
Pt called and stated that she needs a new script for gabapentin (NEURONTIN) 600 MG tablet. Please advise, thank you!  Call pt @ [phone]  Pharmacy - [id number] - [place], [place]

## 2016-11-13 NOTE — Telephone Encounter (Signed)
Last OV 11/11/16 last filled 11/07/16 180 1rf [facility] [address], I cancelled rx sent to [facility] and sent in new rx to [facility]

## 2016-11-18 ENCOUNTER — Encounter: Payer: Self-pay | Admitting: Family Medicine

## 2016-11-19 ENCOUNTER — Other Ambulatory Visit: Payer: Self-pay

## 2016-11-19 ENCOUNTER — Telehealth: Payer: Self-pay

## 2016-11-19 ENCOUNTER — Other Ambulatory Visit: Payer: Self-pay | Admitting: Family Medicine

## 2016-11-19 DIAGNOSIS — Z86010 Personal history of colonic polyps: Secondary | ICD-10-CM

## 2016-11-19 NOTE — Telephone Encounter (Signed)
Pt scheduled for colonoscopy at [facility] with Dr. Vicente Males on Friday, April 13th. Instructs/rx mailed.

## 2016-11-19 NOTE — Telephone Encounter (Signed)
Gastroenterology Pre-Procedure Review  Request Date:  Requesting Physician:   PATIENT REVIEW QUESTIONS: The patient responded to the following health history questions as indicated:    1. Are you having any GI issues? no 2. Do you have a personal history of Polyps? no 3. Do you have a family history of Colon Cancer or Polyps? no 4. Diabetes Mellitus? yes (Type 2) 5. Joint replacements in the past 12 months?yes (2015) 6. Major health problems in the past 3 months?no 7. Any artificial heart valves, MVP, or defibrillator?no    MEDICATIONS & ALLERGIES:    Patient reports the following regarding taking any anticoagulation/antiplatelet therapy:   Plavix, Coumadin, Eliquis, Xarelto, Lovenox, Pradaxa, Brilinta, or Effient? Eliquis  Aspirin? no  Patient confirms/reports the following medications:  Current Outpatient Prescriptions  Medication Sig Dispense Refill  . albuterol (PROVENTIL HFA;VENTOLIN HFA) 108 (90 Base) MCG/ACT inhaler Inhale 2 puffs into the lungs every 6 (six) hours as needed for wheezing or shortness of breath. 1 Inhaler 2  . apixaban (ELIQUIS) 5 MG TABS tablet Take 1 tablet (5 mg total) by mouth 2 (two) times daily. 60 tablet 5  . benzonatate (TESSALON PERLES) 100 MG capsule Take 1 capsule (100 mg total) by mouth 3 (three) times daily as needed for cough. 30 capsule 0  . carvedilol (COREG) 3.125 MG tablet Take 1 tablet (3.125 mg total) by mouth 2 (two) times daily with a meal. 60 tablet 0  . chlorpheniramine-HYDROcodone (TUSSIONEX PENNKINETIC ER) 10-8 MG/5ML SUER Take 5 mLs by mouth at bedtime as needed for cough. 115 mL 0  . cyclobenzaprine (FLEXERIL) 10 MG tablet TAKE 0.5 TABLETS (5 MG TOTAL) BY MOUTH 3 (THREE) TIMES DAILY AS NEEDED FOR MUSCLE SPASMS. 30 tablet 0  . dicyclomine (BENTYL) 20 MG tablet Take 1 tablet (20 mg total) by mouth 3 (three) times daily as needed for spasms. 90 tablet 1  . diltiazem (CARDIZEM SR) 60 MG 12 hr capsule Take 1 capsule (60 mg total) by mouth  every 12 (twelve) hours. 60 capsule 1  . diphenhydrAMINE (BENADRYL) 25 MG tablet Take 1 tablet (25 mg total) by mouth every 6 (six) hours. 12 tablet 0  . escitalopram (LEXAPRO) 10 MG tablet Take 15 mg by mouth daily.    . famotidine (PEPCID) 20 MG tablet Take 1 tablet (20 mg total) by mouth 2 (two) times daily. 16 tablet 0  . furosemide (LASIX) 20 MG tablet Take 1 tablet (20 mg total) by mouth daily. 30 tablet 1  . gabapentin (NEURONTIN) 600 MG tablet Take 1 tablet (600 mg total) by mouth 2 (two) times daily. 180 tablet 1  . isosorbide mononitrate (IMDUR) 30 MG 24 hr tablet TAKE 1 TABLET BY MOUTH EVERY DAY 30 tablet 5  . magnesium hydroxide (MILK OF MAGNESIA) 400 MG/5ML suspension Take 30 mLs by mouth daily as needed for mild constipation. 360 mL 0  . OLANZapine (ZYPREXA) 10 MG tablet Take 1 tablet (10 mg total) by mouth at bedtime. 30 tablet 1  . ondansetron (ZOFRAN) 4 MG tablet TAKE 1 TABLET (4 MG TOTAL) BY MOUTH EVERY 8 (EIGHT) HOURS AS NEEDED FOR NAUSEA OR VOMITING. 20 tablet 0  . pantoprazole (PROTONIX) 40 MG tablet Take 1 tablet (40 mg total) by mouth daily. 60 tablet 0  . polyethylene glycol (MIRALAX / GLYCOLAX) packet Take 17 g by mouth daily. Mix one tablespoon with 8oz of your favorite juice or water every day until you are having soft formed stools. Then start taking once daily if you  didn't have a stool the day before. 30 each 0  . potassium chloride SA (K-DUR,KLOR-CON) 10 MEQ tablet Take 1 tablet (10 mEq total) by mouth daily. 30 tablet 1  . traZODone (DESYREL) 100 MG tablet Take 200 mg by mouth at bedtime as needed for sleep.    Marland Kitchen triamcinolone cream (KENALOG) 0.1 % Apply 1 application topically 2 (two) times daily. 30 g 0   No current facility-administered medications for this visit.     Patient confirms/reports the following allergies:  Allergies  Allergen Reactions  . Abilify [Aripiprazole] Other (See Comments)    Jerking movement, slow motor skills  . Augmentin  [Amoxicillin-Pot Clavulanate] Diarrhea and Nausea And Vomiting  . Bactrim [Sulfamethoxazole-Trimethoprim] Diarrhea  . Ceftin [Cefuroxime Axetil] Diarrhea  . Coconut Oil     Rash   . Diclofenac Other (See Comments)     Muscle Pain  . Dye Fdc Red [Red Dye]     "hair dye"  . Indocin [Indomethacin] Other (See Comments)    Migraines   . Linaclotide Diarrhea  . Lisinopril Diarrhea and Other (See Comments)    Other reaction(s): Dizziness, Vomiting  . Morphine Other (See Comments)    Chest Tightness  . Morphine And Related Other (See Comments)    Chest tightness   . Simvastatin Other (See Comments)    Muscle Pain  . Sulfa Antibiotics Diarrhea  . Sulfamethoxazole-Trimethoprim Diarrhea  . Wellbutrin [Bupropion] Other (See Comments)    Jerking movement Slurred speech  . Quetiapine Fumarate Palpitations    No orders of the defined types were placed in this encounter.   AUTHORIZATION INFORMATION Primary Insurance: 1D#: Group #:  Secondary Insurance: 1D#: Group #:  SCHEDULE INFORMATION: Date: 12/12/16 Time: Location: [place]

## 2016-11-19 NOTE — Telephone Encounter (Signed)
Pt scheduled for a colonoscopy with Vicente Males on April 13th.   Precert for Hx of colon polyps Z86.010

## 2016-11-19 NOTE — Telephone Encounter (Signed)
Last OV 11/07/16 last filled Ondansetron 10/14/16 20 0rf cyclobenzaprine 11/04/16 30 0rf

## 2016-11-26 ENCOUNTER — Emergency Department: Payer: Medicare Other

## 2016-11-26 ENCOUNTER — Emergency Department
Admission: EM | Admit: 2016-11-26 | Discharge: 2016-11-26 | Disposition: A | Discharge status: Home / Self Care | Payer: Medicare Other | Attending: Emergency Medicine | Admitting: Emergency Medicine

## 2016-11-26 ENCOUNTER — Other Ambulatory Visit: Payer: Self-pay | Admitting: Family Medicine

## 2016-11-26 ENCOUNTER — Encounter: Payer: Self-pay | Admitting: Emergency Medicine

## 2016-11-26 ENCOUNTER — Other Ambulatory Visit: Payer: Self-pay

## 2016-11-26 DIAGNOSIS — R0602 Shortness of breath: Secondary | ICD-10-CM | POA: Insufficient documentation

## 2016-11-26 DIAGNOSIS — Z87891 Personal history of nicotine dependence: Secondary | ICD-10-CM | POA: Diagnosis not present

## 2016-11-26 DIAGNOSIS — I2510 Atherosclerotic heart disease of native coronary artery without angina pectoris: Secondary | ICD-10-CM | POA: Insufficient documentation

## 2016-11-26 DIAGNOSIS — R079 Chest pain, unspecified: Secondary | ICD-10-CM | POA: Diagnosis not present

## 2016-11-26 DIAGNOSIS — I10 Essential (primary) hypertension: Secondary | ICD-10-CM | POA: Insufficient documentation

## 2016-11-26 DIAGNOSIS — Z79899 Other long term (current) drug therapy: Secondary | ICD-10-CM | POA: Diagnosis not present

## 2016-11-26 LAB — CBC
HCT: 31.1 % — ABNORMAL LOW (ref 35.0–47.0)
Hemoglobin: 10.7 g/dL — ABNORMAL LOW (ref 12.0–16.0)
MCH: 31.2 pg (ref 26.0–34.0)
MCHC: 34.3 g/dL (ref 32.0–36.0)
MCV: 90.8 fL (ref 80.0–100.0)
Platelets: 353 10*3/uL (ref 150–440)
RBC: 3.42 MIL/uL — ABNORMAL LOW (ref 3.80–5.20)
RDW: 14.7 % — ABNORMAL HIGH (ref 11.5–14.5)
WBC: 5.6 10*3/uL (ref 3.6–11.0)

## 2016-11-26 LAB — BASIC METABOLIC PANEL
Anion gap: 7 (ref 5–15)
BUN: 14 mg/dL (ref 6–20)
CO2: 27 mmol/L (ref 22–32)
Calcium: 9.2 mg/dL (ref 8.9–10.3)
Chloride: 107 mmol/L (ref 101–111)
Creatinine, Ser: 0.83 mg/dL (ref 0.44–1.00)
GFR calc Af Amer: >60 mL/min (ref >60)
GFR calc non Af Amer: >60 mL/min (ref >60)
Glucose, Bld: 128 mg/dL — ABNORMAL HIGH (ref 65–99)
Potassium: 3.8 mmol/L (ref 3.5–5.1)
Sodium: 141 mmol/L (ref 135–145)

## 2016-11-26 LAB — TROPONIN I: Troponin I: <0.03 ng/mL (ref <0.03)

## 2016-11-26 LAB — BRAIN NATRIURETIC PEPTIDE: B Natriuretic Peptide: 20 pg/mL (ref 0.0–100.0)

## 2016-11-26 NOTE — ED Provider Notes (Signed)
[facility] Emergency Department Provider Note       Time seen: ----------------------------------------- 4:55 PM on 11/26/2016 -----------------------------------------     I have reviewed the triage vital signs and the nursing notes.   HISTORY   Chief Complaint Chest Pain    HPI Valerie Vazquez is a 61 y.o. female who presents to the ED for central chest pain that she describes as burning. She's had some dizziness and weakness. She denies any pain at this time. She does take antacids and is in the care of a [facility] neurologist at this time. She took some aspirin prior to arrival, currently pain is 0 out of 10.   Past Medical History:  Diagnosis Date  . Anemia   . Bilateral renal cysts   . Chronic back pain    "upper and lower" (09/19/2014)  . Depression    "major" (09/19/2014)  . DVT (deep venous thrombosis) ([place])   . Esophageal spasm   . Gallstones   . Gastritis   . Gastroenteritis   . GERD (gastroesophageal reflux disease)   . Headache    "couple /month lately" (09/19/2014)  . High blood pressure   . High cholesterol   . History of blood transfusion 1985   related to hysterectomy  . Hypertension   . IBS (irritable bowel syndrome)   . Migraine    "a few times/yr" (09/19/2014)  . Myocardial infarction 10/2010 X 3   "while hospitalized"  . Osteoarthritis    "qwhere; mostly around my joints" (09/19/2014)  . Pneumonia 04/2014  . PTSD (post-traumatic stress disorder)   . Pulmonary embolism ([place]) 04/2001; 09/19/2014   "after gallbladder OR; "  . Schizoaffective disorder ([place])   . Sleep apnea    "mild" (09/19/2014)  . Snoring    a. sleep study 5/16: No OSA  . Spinal stenosis   . Spondylosis   . Type II diabetes mellitus ([place])    "dx'd in 2007; lost weight; no RX for ~ 4 yr now" (09/19/2014)    Patient Active Problem List   Diagnosis Date Noted  . Plantar fasciitis of left foot 11/07/2016  . Costochondritis 10/06/2016  . BRBPR  (bright red blood per rectum) 10/06/2016  . CAD (coronary artery disease) 09/28/2016  . Diarrhea 09/16/2016  . Epigastric discomfort 09/16/2016  . Schizoaffective disorder, depressive type ([name]) 08/28/2016  . Suicide attempt 08/27/2016  . Colitis 04/08/2016  . Absolute anemia 01/29/2016  . Chronic shoulder pain (Location of Primary Source of Pain) (Left) 01/29/2016  . Osteoarthritis of hip (Bilateral) 01/29/2016  . Lumbar central spinal stenosis (moderate at L4-5; mild at L2-3 and L3-4) 01/29/2016  . Hx of cervical spine surgery 01/29/2016  . Numbness 11/19/2015  . Breast pain 10/19/2015  . Chest pain 10/14/2015  . Shortness of breath 07/31/2015  . Pleural effusion, bilateral 07/31/2015  . Esophageal spasm 06/05/2015  . History of DVT (deep vein thrombosis) 02/25/2015  . 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 Constipation 12/12/2014  . Coronary vasospasm ([place]) 12/09/2014  . History of pulmonary embolus (PE) 09/19/2014  . Anxiety and depression 04/13/2014  . IBS (irritable bowel syndrome) 04/11/2014  . GERD (gastroesophageal reflux disease) 04/11/2014  . Insomnia 04/11/2014  . Essential hypertension, benign 10/28/2013  . Anemia 10/28/2013  . HLD (hyperlipidemia) 10/28/2013    Past Surgical History:  Procedure Laterality Date  . ABDOMINAL HYSTERECTOMY  1985  . ANTERIOR CERVICAL DECOMP/DISCECTOMY FUSION  10/2012  . APPENDECTOMY  1985  . BACK SURGERY    . BREAST CYST  EXCISION Right   . BREAST EXCISIONAL BIOPSY Right YRS AGO   NEG  . CARDIAC CATHETERIZATION   2000's; 2009  . CHOLECYSTECTOMY  2002  . EXCISION/RELEASE BURSA HIP Right 12/1984  . HERNIA REPAIR  1985  . LEFT HEART CATHETERIZATION WITH CORONARY ANGIOGRAM N/A 12/09/2014   Procedure: LEFT HEART CATHETERIZATION WITH CORONARY ANGIOGRAM;  Surgeon: Burnell Blanks, MD;  Location: [facility] CATH LAB;  Service: Cardiovascular;  Laterality: N/A;  . TONSILLECTOMY AND ADENOIDECTOMY  ~ 1968  . TOTAL HIP ARTHROPLASTY Left 04-06-2014  .  UMBILICAL HERNIA REPAIR  1985  . VENA CAVA FILTER PLACEMENT  03/2014    Allergies Abilify [aripiprazole]; Augmentin [amoxicillin-pot clavulanate]; Bactrim [sulfamethoxazole-trimethoprim]; Ceftin [cefuroxime axetil]; Coconut oil; Diclofenac; Dye fdc red [red dye]; Indocin [indomethacin]; Linaclotide; Lisinopril; Morphine; Morphine and related; Simvastatin; Sulfa antibiotics; Sulfamethoxazole-trimethoprim; Wellbutrin [bupropion]; and Quetiapine fumarate  Social History Social History  Substance Use Topics  . Smoking status: Former Smoker    Packs/day: 1.50    Years: 10.00    Types: Cigarettes    Quit date: 04/12/1979  . Smokeless tobacco: Never Used  . Alcohol use No    Review of Systems Constitutional: Negative for fever. Cardiovascular: Positive for chest pain Respiratory: Negative for shortness of breath. Gastrointestinal: Negative for abdominal pain, vomiting and diarrhea. Genitourinary: Negative for dysuria. Musculoskeletal: Negative for back pain. Skin: Negative for rash. Neurological: Negative for headaches, focal weakness or numbness.  10-point ROS otherwise negative.  ____________________________________________   PHYSICAL EXAM:  VITAL SIGNS: ED Triage Vitals  Enc Vitals Group     BP 11/26/16 1445 139/65     Pulse Rate 11/26/16 1445 75     Resp 11/26/16 1445 16     Temp 11/26/16 1445 98.6 F (37 C)     Temp Source 11/26/16 1445 Oral     SpO2 11/26/16 1445 97 %     Weight 11/26/16 1445 210 lb (95.3 kg)     Height 11/26/16 1445 5\' 6"  (1.676 m)     Head Circumference --      Peak Flow --      Pain Score 11/26/16 1455 0     Pain Loc --      Pain Edu? --      Excl. in [place]? --     Constitutional: Alert and oriented. Well appearing and in no distress. Eyes: Conjunctivae are normal. PERRL. Normal extraocular movements. ENT   Head: Normocephalic and atraumatic.   Nose: No congestion/rhinnorhea.   Mouth/Throat: Mucous membranes are moist.   Neck: No  stridor. Cardiovascular: Normal rate, regular rhythm. No murmurs, rubs, or gallops. Respiratory: Normal respiratory effort without tachypnea nor retractions. Breath sounds are clear and equal bilaterally. No wheezes/rales/rhonchi. Gastrointestinal: Soft and nontender. Normal bowel sounds Musculoskeletal: Nontender with normal range of motion in extremities. No lower extremity tenderness nor edema. Neurologic:  Normal speech and language. No gross focal neurologic deficits are appreciated.  Skin:  Skin is warm, dry and intact. No rash noted. Psychiatric: Mood and affect are normal. Speech and behavior are normal.  ____________________________________________  EKG: Interpreted by me. Sinus rhythm rate 80 bpm, normal PR interval, normal QRS, normal QT, nonspecific T wave changes, unchanged from prior.  ____________________________________________  ED COURSE:  Pertinent labs & imaging results that were available during my care of the patient were reviewed by me and considered in my medical decision making (see chart for details). Patient presents for chest pain, we will assess with labs and imaging as indicated.   Procedures ____________________________________________  LABS (pertinent positives/negatives)  Labs Reviewed  BASIC METABOLIC PANEL - Abnormal; Notable for the following:       Result Value   Glucose, Bld 128 (*)    All other components within normal limits  CBC - Abnormal; Notable for the following:    RBC 3.42 (*)    Hemoglobin 10.7 (*)    HCT 31.1 (*)    RDW 14.7 (*)    All other components within normal limits  TROPONIN I  BRAIN NATRIURETIC PEPTIDE    RADIOLOGY  Chest x-ray Is normal ____________________________________________  FINAL ASSESSMENT AND PLAN  Chest pain  Plan: Patient's labs and imaging were dictated above. Patient had presented for chest pain, recently was admitted 2 months ago for chest pain and had negative workup including stress testing.  Testing today has been unremarkable, she is encouraged to continue her home medications and follow-up as an outpatient.   Earleen Newport, MD   Note: This note was generated in part or whole with voice recognition software. Voice recognition is usually quite accurate but there are transcription errors that can and very often do occur. I apologize for any typographical errors that were not detected and corrected.     Earleen Newport, MD 11/26/16 [phone]

## 2016-11-26 NOTE — ED Triage Notes (Signed)
Pt in via POV with complaints of sudden onset centralized chest pain this morning with radiation to neck.  Pt reports associated light headedness, nausea, weakness.  Pt denies pain at this time.  Pt reports taking 324mg  ASA prior to arrival.  NAD noted at this time.

## 2016-11-26 NOTE — ED Notes (Signed)
Pt discharged home after verbalizing understanding of discharge instructions; nad noted. 

## 2016-11-26 NOTE — ED Notes (Signed)
FIRST NURSE: chest pain awoke this AM, continues 324ASA taken PTA

## 2016-11-27 ENCOUNTER — Encounter: Payer: Self-pay | Admitting: Family Medicine

## 2016-11-27 ENCOUNTER — Encounter: Payer: Self-pay | Admitting: Cardiovascular Disease

## 2016-11-28 ENCOUNTER — Encounter: Payer: Self-pay | Admitting: Cardiovascular Disease

## 2016-12-03 ENCOUNTER — Other Ambulatory Visit: Payer: Self-pay | Admitting: Family Medicine

## 2016-12-03 NOTE — Telephone Encounter (Signed)
Last OV 11/07/16 last filled Ondansetron 11/19/16 20 0rf Cyclobenzaprine 11/19/16 30 0rf Imdur 11/04/16 30 5rf

## 2016-12-03 NOTE — Telephone Encounter (Signed)
Please find out why patient continues to need Zofran. This was refused. Others refilled.

## 2016-12-04 NOTE — Telephone Encounter (Signed)
Left message to return call 

## 2016-12-05 NOTE — Telephone Encounter (Signed)
Patient states she doesn't use the zofran as much but still uses it periodically

## 2016-12-08 NOTE — Telephone Encounter (Signed)
Noted. If she continues to need this we should consider evaluation in the office for chronic nausea.

## 2016-12-09 ENCOUNTER — Other Ambulatory Visit: Payer: Self-pay

## 2016-12-09 DIAGNOSIS — K5900 Constipation, unspecified: Secondary | ICD-10-CM

## 2016-12-09 DIAGNOSIS — I519 Heart disease, unspecified: Secondary | ICD-10-CM | POA: Insufficient documentation

## 2016-12-09 HISTORY — DX: Heart disease, unspecified: I51.9

## 2016-12-09 MED ORDER — PEG 3350-KCL-NA BICARB-NACL 420 G PO SOLR: 4000.0000 mL | Freq: Once | ORAL | 0 refills | Status: AC

## 2016-12-09 MED ORDER — PEG 3350-KCL-NA BICARB-NACL 420 G PO SOLR: 4000.0000 mL | Freq: Once | ORAL | 0 refills | Status: DC

## 2016-12-09 NOTE — Telephone Encounter (Signed)
Patient advised of below . Scheduled for colonoscopy this Friday.

## 2016-12-09 NOTE — Telephone Encounter (Signed)
Left voice mail to call back 

## 2016-12-11 ENCOUNTER — Other Ambulatory Visit: Payer: Self-pay

## 2016-12-11 MED ORDER — NA SULFATE-K SULFATE-MG SULF 17.5-3.13-1.6 GM/177ML PO SOLN: 1.0000 | ORAL | 0 refills | Status: DC

## 2016-12-12 ENCOUNTER — Ambulatory Visit
Admission: RE | Admit: 2016-12-12 | Discharge: 2016-12-12 | Disposition: A | Discharge status: Home / Self Care | Payer: Medicare Other | Source: Ambulatory Visit | Attending: Gastroenterology | Admitting: Gastroenterology

## 2016-12-12 ENCOUNTER — Encounter
Admission: RE | Disposition: A | Discharge status: Home / Self Care | Payer: Self-pay | Source: Ambulatory Visit | Attending: Gastroenterology

## 2016-12-12 ENCOUNTER — Encounter: Payer: Self-pay | Admitting: Anesthesiology

## 2016-12-12 ENCOUNTER — Ambulatory Visit: Payer: Medicare Other | Admitting: Anesthesiology

## 2016-12-12 DIAGNOSIS — Z880 Allergy status to penicillin: Secondary | ICD-10-CM | POA: Insufficient documentation

## 2016-12-12 DIAGNOSIS — Z7901 Long term (current) use of anticoagulants: Secondary | ICD-10-CM | POA: Insufficient documentation

## 2016-12-12 DIAGNOSIS — F329 Major depressive disorder, single episode, unspecified: Secondary | ICD-10-CM | POA: Diagnosis not present

## 2016-12-12 DIAGNOSIS — I2510 Atherosclerotic heart disease of native coronary artery without angina pectoris: Secondary | ICD-10-CM | POA: Diagnosis not present

## 2016-12-12 DIAGNOSIS — Z885 Allergy status to narcotic agent status: Secondary | ICD-10-CM | POA: Insufficient documentation

## 2016-12-12 DIAGNOSIS — K589 Irritable bowel syndrome without diarrhea: Secondary | ICD-10-CM | POA: Diagnosis not present

## 2016-12-12 DIAGNOSIS — Z881 Allergy status to other antibiotic agents status: Secondary | ICD-10-CM | POA: Diagnosis not present

## 2016-12-12 DIAGNOSIS — D120 Benign neoplasm of cecum: Secondary | ICD-10-CM

## 2016-12-12 DIAGNOSIS — F259 Schizoaffective disorder, unspecified: Secondary | ICD-10-CM | POA: Diagnosis not present

## 2016-12-12 DIAGNOSIS — Z1211 Encounter for screening for malignant neoplasm of colon: Secondary | ICD-10-CM | POA: Diagnosis not present

## 2016-12-12 DIAGNOSIS — E119 Type 2 diabetes mellitus without complications: Secondary | ICD-10-CM | POA: Diagnosis not present

## 2016-12-12 DIAGNOSIS — I252 Old myocardial infarction: Secondary | ICD-10-CM | POA: Insufficient documentation

## 2016-12-12 DIAGNOSIS — K635 Polyp of colon: Secondary | ICD-10-CM | POA: Insufficient documentation

## 2016-12-12 DIAGNOSIS — I10 Essential (primary) hypertension: Secondary | ICD-10-CM | POA: Insufficient documentation

## 2016-12-12 DIAGNOSIS — Z888 Allergy status to other drugs, medicaments and biological substances status: Secondary | ICD-10-CM | POA: Insufficient documentation

## 2016-12-12 DIAGNOSIS — Z86010 Personal history of colonic polyps: Secondary | ICD-10-CM | POA: Insufficient documentation

## 2016-12-12 DIAGNOSIS — Z86718 Personal history of other venous thrombosis and embolism: Secondary | ICD-10-CM | POA: Insufficient documentation

## 2016-12-12 DIAGNOSIS — K219 Gastro-esophageal reflux disease without esophagitis: Secondary | ICD-10-CM | POA: Diagnosis not present

## 2016-12-12 DIAGNOSIS — Z79899 Other long term (current) drug therapy: Secondary | ICD-10-CM | POA: Diagnosis not present

## 2016-12-12 DIAGNOSIS — Z86711 Personal history of pulmonary embolism: Secondary | ICD-10-CM | POA: Insufficient documentation

## 2016-12-12 DIAGNOSIS — Z87891 Personal history of nicotine dependence: Secondary | ICD-10-CM | POA: Insufficient documentation

## 2016-12-12 HISTORY — PX: COLONOSCOPY WITH PROPOFOL: SHX5780

## 2016-12-12 SURGERY — COLONOSCOPY WITH PROPOFOL
Anesthesia: General

## 2016-12-12 MED ORDER — SODIUM CHLORIDE 0.9 % IV SOLN
INTRAVENOUS | Status: DC
  Administered 2016-12-12: 11:00:00 via INTRAVENOUS

## 2016-12-12 MED ORDER — PROPOFOL 10 MG/ML IV BOLUS
INTRAVENOUS | Status: DC | PRN
  Administered 2016-12-12: 70 mg via INTRAVENOUS

## 2016-12-12 MED ORDER — MIDAZOLAM HCL 2 MG/2ML IJ SOLN
INTRAMUSCULAR | Status: DC | PRN
  Administered 2016-12-12: 2 mg via INTRAVENOUS

## 2016-12-12 MED ORDER — PROPOFOL 500 MG/50ML IV EMUL
INTRAVENOUS | Status: AC
  Filled 2016-12-12: qty 50

## 2016-12-12 MED ORDER — MIDAZOLAM HCL 2 MG/2ML IJ SOLN
INTRAMUSCULAR | Status: AC
  Filled 2016-12-12: qty 2

## 2016-12-12 MED ORDER — PROPOFOL 500 MG/50ML IV EMUL
INTRAVENOUS | Status: DC | PRN
  Administered 2016-12-12: 150 ug/kg/min via INTRAVENOUS

## 2016-12-12 NOTE — Anesthesia Post-op Follow-up Note (Cosign Needed)
Anesthesia QCDR form completed.        

## 2016-12-12 NOTE — H&P (Signed)
[name] MD [address], [place], [place] [address] Phone: [phone] Fax : [phone]  Primary Care Physician:  [name], MD Primary Gastroenterologist:  Dr. [name]   Pre-Procedure History & Physical: HPI:  Valerie Vazquez is a 61 y.o. female is here for an colonoscopy.   Past Medical History:  Diagnosis Date  . Anemia   . Bilateral renal cysts   . Chronic back pain    "upper and lower" (09/19/2014)  . Depression    "major" (09/19/2014)  . DVT (deep venous thrombosis) ([place])   . Esophageal spasm   . Gallstones   . Gastritis   . Gastroenteritis   . GERD (gastroesophageal reflux disease)   . Headache    "couple /month lately" (09/19/2014)  . High blood pressure   . High cholesterol   . History of blood transfusion 1985   related to hysterectomy  . Hypertension   . IBS (irritable bowel syndrome)   . Migraine    "a few times/yr" (09/19/2014)  . Myocardial infarction 10/2010 X 3   "while hospitalized"  . Osteoarthritis    "qwhere; mostly around my joints" (09/19/2014)  . Pneumonia 04/2014  . PTSD (post-traumatic stress disorder)   . Pulmonary embolism ([place]) 04/2001; 09/19/2014   "after gallbladder OR; "  . Schizoaffective disorder ([place])   . Sleep apnea    "mild" (09/19/2014)  . Snoring    a. sleep study 5/16: No OSA  . Spinal stenosis   . Spondylosis   . Type II diabetes mellitus ([place])    "dx'd in 2007; lost weight; no RX for ~ 4 yr now" (09/19/2014)    Past Surgical History:  Procedure Laterality Date  . ABDOMINAL HYSTERECTOMY  1985  . ANTERIOR CERVICAL DECOMP/DISCECTOMY FUSION  10/2012  . APPENDECTOMY  1985  . BACK SURGERY    . BREAST CYST EXCISION Right   . BREAST EXCISIONAL BIOPSY Right YRS AGO   NEG  . CARDIAC CATHETERIZATION   2000's; 2009  . CHOLECYSTECTOMY  2002  . EXCISION/RELEASE BURSA HIP Right 12/1984  . HERNIA REPAIR  1985  . LEFT HEART CATHETERIZATION WITH CORONARY ANGIOGRAM N/A 12/09/2014   Procedure: LEFT HEART  CATHETERIZATION WITH CORONARY ANGIOGRAM;  Surgeon: Burnell Blanks, MD;  Location: [facility] CATH LAB;  Service: Cardiovascular;  Laterality: N/A;  . TONSILLECTOMY AND ADENOIDECTOMY  ~ 1968  . TOTAL HIP ARTHROPLASTY Left 04-06-2014  . UMBILICAL HERNIA REPAIR  1985  . VENA CAVA FILTER PLACEMENT  03/2014    Prior to Admission medications   Medication Sig Start Date End Date Taking? Authorizing Provider  apixaban (ELIQUIS) 5 MG TABS tablet Take 1 tablet (5 mg total) by mouth 2 (two) times daily. 09/25/16  Yes [name], MD  carvedilol (COREG) 3.125 MG tablet Take 1 tablet (3.125 mg total) by mouth 2 (two) times daily with a meal. 11/11/16  Yes Burnard Hawthorne, FNP  cyclobenzaprine (FLEXERIL) 10 MG tablet TAKE 0.5 TABLETS (5 MG TOTAL) BY MOUTH 3 (THREE) TIMES DAILY AS NEEDED FOR MUSCLE SPASMS. 12/03/16  Yes [name], MD  dicyclomine (BENTYL) 20 MG tablet TAKE 1 TABLET BY MOUTH 3 TIMES A DAY AS NEEDED FOR SPASMS 11/26/16  Yes [name], MD  diltiazem (CARDIZEM SR) 60 MG 12 hr capsule Take 1 capsule (60 mg total) by mouth every 12 (twelve) hours. 09/03/16  Yes Jolanta B Pucilowska, MD  diphenhydrAMINE (BENADRYL) 25 MG tablet Take 1 tablet (25 mg total) by mouth every 6 (six)  hours. 09/12/16  Yes Azalia Bilis, MD  escitalopram (LEXAPRO) 10 MG tablet Take 15 mg by mouth daily.   Yes Historical Provider, MD  famotidine (PEPCID) 20 MG tablet Take 1 tablet (20 mg total) by mouth 2 (two) times daily. 10/15/16  Yes Glori Luis, MD  furosemide (LASIX) 20 MG tablet Take 1 tablet (20 mg total) by mouth daily. 09/04/16  Yes Jolanta B Pucilowska, MD  gabapentin (NEURONTIN) 600 MG tablet Take 1 tablet (600 mg total) by mouth 2 (two) times daily. 11/13/16  Yes Glori Luis, MD  isosorbide mononitrate (IMDUR) 30 MG 24 hr tablet TAKE 1 TABLET BY MOUTH EVERY DAY 12/03/16  Yes Glori Luis, MD  Na Sulfate-K Sulfate-Mg Sulf (SUPREP BOWEL PREP KIT) 17.5-3.13-1.6 GM/180ML SOLN Take 1 kit by  mouth as directed. 12/11/16  Yes Wyline Mood, MD  OLANZapine (ZYPREXA) 10 MG tablet Take 1 tablet (10 mg total) by mouth at bedtime. 09/03/16  Yes Jolanta B Pucilowska, MD  ondansetron (ZOFRAN) 4 MG tablet TAKE 1 TABLET (4 MG TOTAL) BY MOUTH EVERY 8 (EIGHT) HOURS AS NEEDED FOR NAUSEA OR VOMITING. 11/19/16  Yes Glori Luis, MD  pantoprazole (PROTONIX) 40 MG tablet Take 1 tablet (40 mg total) by mouth daily. 11/11/16  Yes Allegra Grana, FNP  potassium chloride SA (K-DUR,KLOR-CON) 10 MEQ tablet Take 1 tablet (10 mEq total) by mouth daily. 09/04/16  Yes Jolanta B Pucilowska, MD  traZODone (DESYREL) 100 MG tablet Take 200 mg by mouth at bedtime as needed for sleep.   Yes Historical Provider, MD  triamcinolone cream (KENALOG) 0.1 % Apply 1 application topically 2 (two) times daily. 09/13/16  Yes Cheri Fowler, PA-C  albuterol (PROVENTIL HFA;VENTOLIN HFA) 108 (90 Base) MCG/ACT inhaler Inhale 2 puffs into the lungs every 6 (six) hours as needed for wheezing or shortness of breath. Patient not taking: Reported on 12/12/2016 09/03/16   Shari Prows, MD  benzonatate (TESSALON PERLES) 100 MG capsule Take 1 capsule (100 mg total) by mouth 3 (three) times daily as needed for cough. Patient not taking: Reported on 12/12/2016 11/11/16 11/11/17  Joni Reining, PA-C  chlorpheniramine-HYDROcodone [facility]) 10-8 MG/5ML SUER Take 5 mLs by mouth at bedtime as needed for cough. Patient not taking: Reported on 12/12/2016 11/11/16   Joni Reining, PA-C  magnesium hydroxide (MILK OF MAGNESIA) 400 MG/5ML suspension Take 30 mLs by mouth daily as needed for mild constipation. Patient not taking: Reported on 12/12/2016 09/30/16 09/30/17  Willy Eddy, MD  polyethylene glycol [facility] Ethelene Hal) packet Take 17 g by mouth daily. Mix one tablespoon with 8oz of your favorite juice or water every day until you are having soft formed stools. Then start taking once daily if you didn't have a stool the day  before. Patient not taking: Reported on 12/12/2016 09/30/16   Willy Eddy, MD    Allergies as of 11/19/2016 - Review Complete 11/12/2016  Allergen Reaction Noted  . Abilify [aripiprazole] Other (See Comments) 09/20/2013  . Augmentin [amoxicillin-pot clavulanate] Diarrhea and Nausea And Vomiting 09/20/2013  . Bactrim [sulfamethoxazole-trimethoprim] Diarrhea 09/20/2013  . Ceftin [cefuroxime axetil] Diarrhea 09/20/2013  . Coconut oil  10/19/2015  . Diclofenac Other (See Comments) 08/01/2014  . Dye fdc red [red dye]  01/22/2016  . Indocin [indomethacin] Other (See Comments) 09/20/2013  . Linaclotide Diarrhea 12/09/2014  . Lisinopril Diarrhea and Other (See Comments) 09/20/2013  . Morphine Other (See Comments) 12/09/2014  . Morphine and related Other (See Comments) 09/20/2013  . Simvastatin  Other (See Comments) 08/01/2014  . Sulfa antibiotics Diarrhea 12/09/2014  . Sulfamethoxazole-trimethoprim Diarrhea 12/09/2014  . Wellbutrin [bupropion] Other (See Comments) 09/20/2013  . Quetiapine fumarate Palpitations 08/01/2014    Family History  Problem Relation Age of Onset  . Stroke Mother     Deceased  . Lung cancer Father     Deceased  . Healthy Daughter   . Healthy Son     x 2  . Other Son     Suicide  . Heart attack Neg Hx     Social History   Social History  . Marital status: Divorced    Spouse name: N/A  . Number of children: N/A  . Years of education: N/A   Occupational History  . Not on file.   Social History Main Topics  . Smoking status: Former Smoker    Packs/day: 1.50    Years: 10.00    Types: Cigarettes    Quit date: 04/12/1979  . Smokeless tobacco: Never Used  . Alcohol use No  . Drug use: No  . Sexual activity: Not Currently   Other Topics Concern  . Not on file   Social History Narrative   Lives with fiancee in an apartment on the first floor.  Has 3 grown children.  One child passed away.  On disability November 28, 1994 - 2000, 2007 - current.      Education: some college.    Review of Systems: See HPI, otherwise negative ROS  Physical Exam: Pulse 87   Temp 98 F (36.7 C) (Oral)   Resp 16   Ht '5\' 6"'$  (1.676 m)   Wt 210 lb (95.3 kg)   SpO2 98%   BMI 33.89 kg/m  General:   Alert,  pleasant and cooperative in NAD Head:  Normocephalic and atraumatic. Neck:  Supple; no masses or thyromegaly. Lungs:  Clear throughout to auscultation.    Heart:  Regular rate and rhythm. Abdomen:  Soft, nontender and nondistended. Normal bowel sounds, without guarding, and without rebound.   Neurologic:  Alert and  oriented x4;  grossly normal neurologically.  Impression/Plan: Valerie Vazquez is here for an colonoscopy to be performed for surveillance due to prior history of polyps. She has not stopped the eloquis and I explained that I will not be able to excise any polyps. She is still willing to proceed and return for a repeat procedure if needed. She is aware she may get charged twice .   Risks, benefits, limitations, and alternatives regarding  colonoscopy have been reviewed with the patient.  Questions have been answered.  All parties agreeable.   [name], MD  12/12/2016, 10:52 AM

## 2016-12-12 NOTE — Transfer of Care (Signed)
Immediate Anesthesia Transfer of Care Note  Patient: Valerie Vazquez  Procedure(s) Performed: Procedure(s): COLONOSCOPY WITH PROPOFOL (N/A)  Patient Location: PACU  Anesthesia Type:General  Level of Consciousness: sedated and responds to stimulation  Airway & Oxygen Therapy: Patient Spontanous Breathing and Patient connected to nasal cannula oxygen  Post-op Assessment: Report given to RN and Post -op Vital signs reviewed and stable  Post vital signs: Reviewed and stable  Last Vitals:  Vitals:   12/12/16 1021 12/12/16 1202  BP:  123/78  Pulse: 87 81  Resp: 16 13  Temp: 36.7 C     Last Pain:  Vitals:   12/12/16 1021  TempSrc: Oral  PainSc: 1          Complications: No apparent anesthesia complications

## 2016-12-12 NOTE — Anesthesia Preprocedure Evaluation (Signed)
Anesthesia Evaluation  Patient identified by MRN, date of birth, ID band Patient awake    Reviewed: Allergy & Precautions, H&P , NPO status , Patient's Chart, lab work & pertinent test results  History of Anesthesia Complications Negative for: history of anesthetic complications  Airway Mallampati: III  TM Distance: >3 FB Neck ROM: limited    Dental  (+) Poor Dentition, Chipped, Caps   Pulmonary neg shortness of breath, sleep apnea , pneumonia, resolved, former smoker,    Pulmonary exam normal breath sounds clear to auscultation       Cardiovascular Exercise Tolerance: Good hypertension, (-) angina+ CAD and + Past MI  (-) Cardiac Stents and (-) DOE Normal cardiovascular exam Rhythm:regular Rate:Normal     Neuro/Psych  Headaches, PSYCHIATRIC DISORDERS Depression Schizophrenia  Neuromuscular disease    GI/Hepatic Neg liver ROS, GERD  Controlled and Medicated,  Endo/Other  diabetes, Type 2  Renal/GU Renal disease  negative genitourinary   Musculoskeletal  (+) Arthritis ,   Abdominal   Peds  Hematology negative hematology ROS (+)   Anesthesia Other Findings Past Medical History: No date: Anemia No date: Bilateral renal cysts No date: Chronic back pain     Comment: "upper and lower" (09/19/2014) No date: Depression     Comment: "major" (09/19/2014) No date: DVT (deep venous thrombosis) ([facility]) No date: Esophageal spasm No date: Gallstones No date: Gastritis No date: Gastroenteritis No date: GERD (gastroesophageal reflux disease) No date: Headache     Comment: "couple /month lately" (09/19/2014) No date: High blood pressure No date: High cholesterol 1985: History of blood transfusion     Comment: related to hysterectomy No date: Hypertension No date: IBS (irritable bowel syndrome) No date: Migraine     Comment: "a few times/yr" (09/19/2014) 10/2010 X 3: Myocardial infarction     Comment: "while hospitalized" No  date: Osteoarthritis     Comment: "qwhere; mostly around my joints" (09/19/2014) 04/2014: Pneumonia No date: PTSD (post-traumatic stress disorder) 04/2001; 09/19/2014: Pulmonary embolism ([place])     Comment: "after gallbladder OR; " No date: Schizoaffective disorder ([place]) No date: Sleep apnea     Comment: "mild" (09/19/2014) No date: Snoring     Comment: a. sleep study 5/16: No OSA No date: Spinal stenosis No date: Spondylosis No date: Type II diabetes mellitus ([place])     Comment: "dx'd in 2007; lost weight; no RX for ~ 4 yr               now" (09/19/2014)  Past Surgical History: 1985: ABDOMINAL HYSTERECTOMY 10/2012: ANTERIOR CERVICAL DECOMP/DISCECTOMY FUSION 1985: APPENDECTOMY No date: BACK SURGERY No date: BREAST CYST EXCISION Right YRS AGO: BREAST EXCISIONAL BIOPSY Right     Comment: NEG  2000's; 2009: CARDIAC CATHETERIZATION 2002: CHOLECYSTECTOMY 12/1984: EXCISION/RELEASE BURSA HIP Right 1985: HERNIA REPAIR 12/09/2014: LEFT HEART CATHETERIZATION WITH CORONARY ANGIO* N/A     Comment: Procedure: [place];  Surgeon: Burnell Blanks, MD;  Location[facility] C[facility] LAB;  Service:              Cardiovascular;  Laterality: N/A; ~ 1968: TONSILLECTOMY AND ADENOIDECTOMY 04-06-2014: TOTAL HIP ARTHROPLASTY Left 2376: UMBILICAL HERNIA REPAIR 10/8313: VENA CAVA FILTER PLACEMENT  BMI    Body Mass Index:  33.89 kg/m      Reproductive/Obstetrics negative OB ROS  Anesthesia Physical Anesthesia Plan  ASA: III  Anesthesia Plan: General   Post-op Pain Management:    Induction:   Airway Management Planned:   Additional Equipment:   Intra-op Plan:   Post-operative Plan:   Informed Consent: I have reviewed the patients History and Physical, chart, labs and discussed the procedure including the risks, benefits and alternatives for the proposed anesthesia with the  patient or authorized representative who has indicated his/her understanding and acceptance.   Dental Advisory Given  Plan Discussed with: Anesthesiologist, CRNA and Surgeon  Anesthesia Plan Comments:         Anesthesia Quick Evaluation

## 2016-12-12 NOTE — Op Note (Signed)
[facility] Gastroenterology Patient Name: Valerie Vazquez Procedure Date: 12/12/2016 11:29 AM MRN: [id number] Account #: [id number] Date of Birth: [birth year] Admit Type: Outpatient Age: 61 Room: [facility] ENDO ROOM 4 Gender: Female Note Status: Finalized Procedure:            Colonoscopy Indications:          High risk colon cancer surveillance: Personal history                        of colonic polyps Providers:            [name] MD, MD Referring MD:         Angela Adam. Caryl Bis (Referring MD) Medicines:            Monitored Anesthesia Care Complications:        No immediate complications. Procedure:            Pre-Anesthesia Assessment:                       - Prior to the procedure, a History and Physical was                        performed, and patient medications, allergies and                        sensitivities were reviewed. The patient's tolerance of                        previous anesthesia was reviewed.                       - The risks and benefits of the procedure and the                        sedation options and risks were discussed with the                        patient. All questions were answered and informed                        consent was obtained.                       - ASA Grade Assessment: III - A patient with severe                        systemic disease.                       - Prior Anticoagulants: The patient has taken Eliquis                        (apixaban), last dose was day of procedure.                       After obtaining informed consent, the colonoscope was                        passed under direct vision. Throughout the procedure,  the patient's blood pressure, pulse, and oxygen                        saturations were monitored continuously. The                        Colonoscope was introduced through the anus and                        advanced to the the cecum, identified by the            appendiceal orifice, IC valve and transillumination.                        The colonoscopy was performed with ease. The patient                        tolerated the procedure well. The quality of the bowel                        preparation was good. Findings:      The perianal and digital rectal examinations were normal.      A 6 mm, non-bleeding polyp was found in the cecum. The polyp was       sessile. Polypectomy was not attempted due to not stopping her       anticoagulation {skip}.      The exam was otherwise without abnormality on direct and retroflexion       views. Impression:           - One 6 mm, non-bleeding polyp in the cecum. Resection                        not attempted.                       - The examination was otherwise normal on direct and                        retroflexion views.                       - No specimens collected. Recommendation:       - Discharge patient to home (with escort).                       - Resume previous diet.                       - Continue present medications.                       - Repeat colonoscopy in 3 months for retreatment.                       - She was aware before the procedure today that I would                        not be excising any polyps seen as she had not stopped                        her Apixaban. She is aware that  a repeat procedure                        would be needed and associated costs if applicable . Procedure Code(s):    --- Professional ---                       T0626, Colorectal cancer screening; colonoscopy on                        individual at high risk Diagnosis Code(s):    --- Professional ---                       Z86.010, Personal history of colonic polyps                       D12.0, Benign neoplasm of cecum CPT copyright 2016 American Medical Association. All rights reserved. The codes documented in this report are preliminary and upon coder review may  be revised to meet current  compliance requirements. [name], MD [name] MD, MD 12/12/2016 11:59:41 AM This report has been signed electronically. Number of Addenda: 0 Note Initiated On: 12/12/2016 11:29 AM Scope Withdrawal Time: 0 hours 8 minutes 56 seconds  Total Procedure Duration: 0 hours 18 minutes 14 seconds       [facility]

## 2016-12-12 NOTE — Anesthesia Procedure Notes (Signed)
Performed by: Kaede Clendenen Pre-anesthesia Checklist: Patient identified, Emergency Drugs available, Suction available, Patient being monitored and Timeout performed Patient Re-evaluated:Patient Re-evaluated prior to inductionOxygen Delivery Method: Nasal cannula Preoxygenation: Pre-oxygenation with 100% oxygen Intubation Type: IV induction       

## 2016-12-12 NOTE — Anesthesia Postprocedure Evaluation (Signed)
Anesthesia Post Note  Patient: Valerie Vazquez  Procedure(s) Performed: Procedure(s) (LRB): COLONOSCOPY WITH PROPOFOL (N/A)  Patient location during evaluation: Endoscopy Anesthesia Type: General Level of consciousness: awake and alert Pain management: pain level controlled Vital Signs Assessment: post-procedure vital signs reviewed and stable Respiratory status: spontaneous breathing, nonlabored ventilation, respiratory function stable and patient connected to nasal cannula oxygen Cardiovascular status: blood pressure returned to baseline and stable Postop Assessment: no signs of nausea or vomiting Anesthetic complications: no     Last Vitals:  Vitals:   12/12/16 1222 12/12/16 1232  BP: (!) 141/82 (!) 143/67  Pulse: 75 75  Resp: 20 16  Temp:      Last Pain:  Vitals:   12/12/16 1232  TempSrc:   PainSc: 0-No pain                 Precious Haws Ismael Karge

## 2016-12-15 ENCOUNTER — Encounter: Payer: Self-pay | Admitting: Gastroenterology

## 2016-12-15 ENCOUNTER — Other Ambulatory Visit: Payer: Self-pay | Admitting: Family

## 2016-12-15 DIAGNOSIS — Z760 Encounter for issue of repeat prescription: Secondary | ICD-10-CM

## 2016-12-15 NOTE — Telephone Encounter (Signed)
Pt called requesting this medication along with needing a new rx for diltiazem (CARDIZEM SR) 60 MG 12 hr capsule. Pt has no medication left. Please advise, thank you!  Pharmacy - [id number] - [place], [place]

## 2016-12-16 ENCOUNTER — Other Ambulatory Visit: Payer: Self-pay | Admitting: *Deleted

## 2016-12-16 DIAGNOSIS — Z760 Encounter for issue of repeat prescription: Secondary | ICD-10-CM

## 2016-12-16 IMAGING — CR DG ABDOMEN 1V
2 series · 2 of 2 positions shown · non-contrast
Comparison: CT 10/28/2014.

CLINICAL DATA: Nausea with constipation for 1 week. History of
hernia surgery and cholecystectomy. Initial encounter.

EXAM:
ABDOMEN - 1 VIEW

[t abdomen supine (1 of 2)]
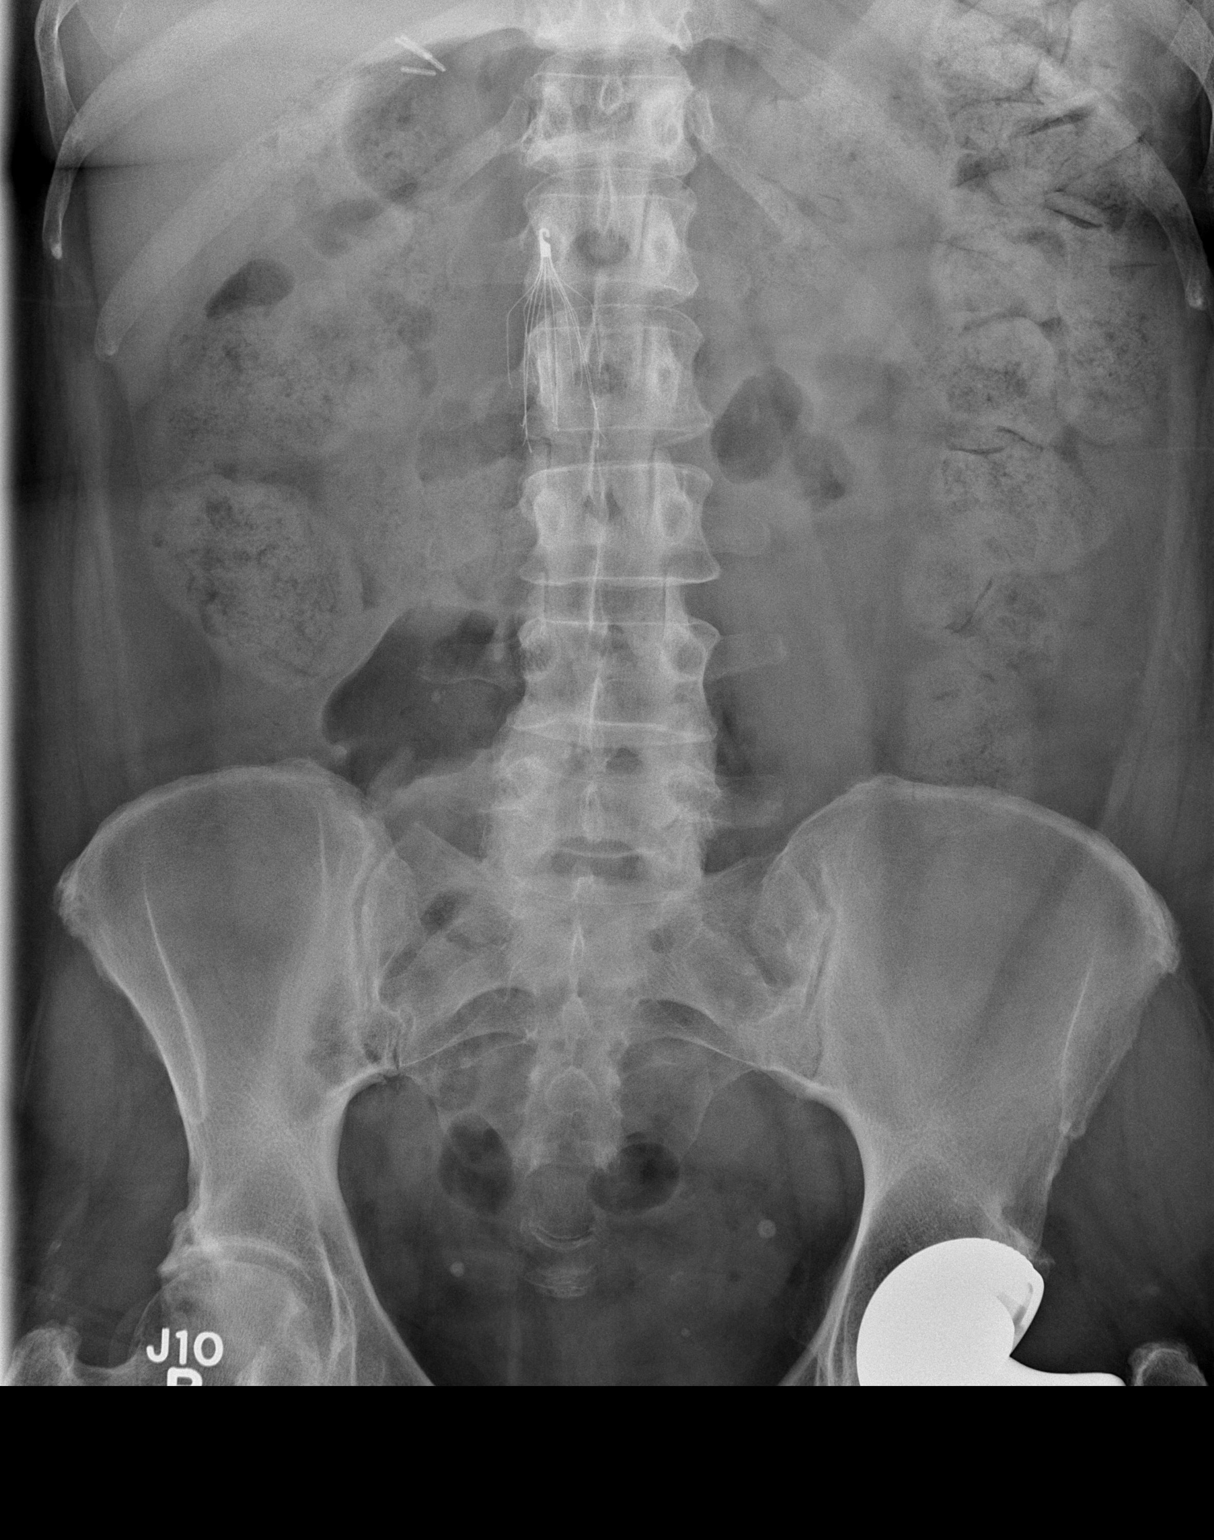

[t abdomen supine (2 of 2)]
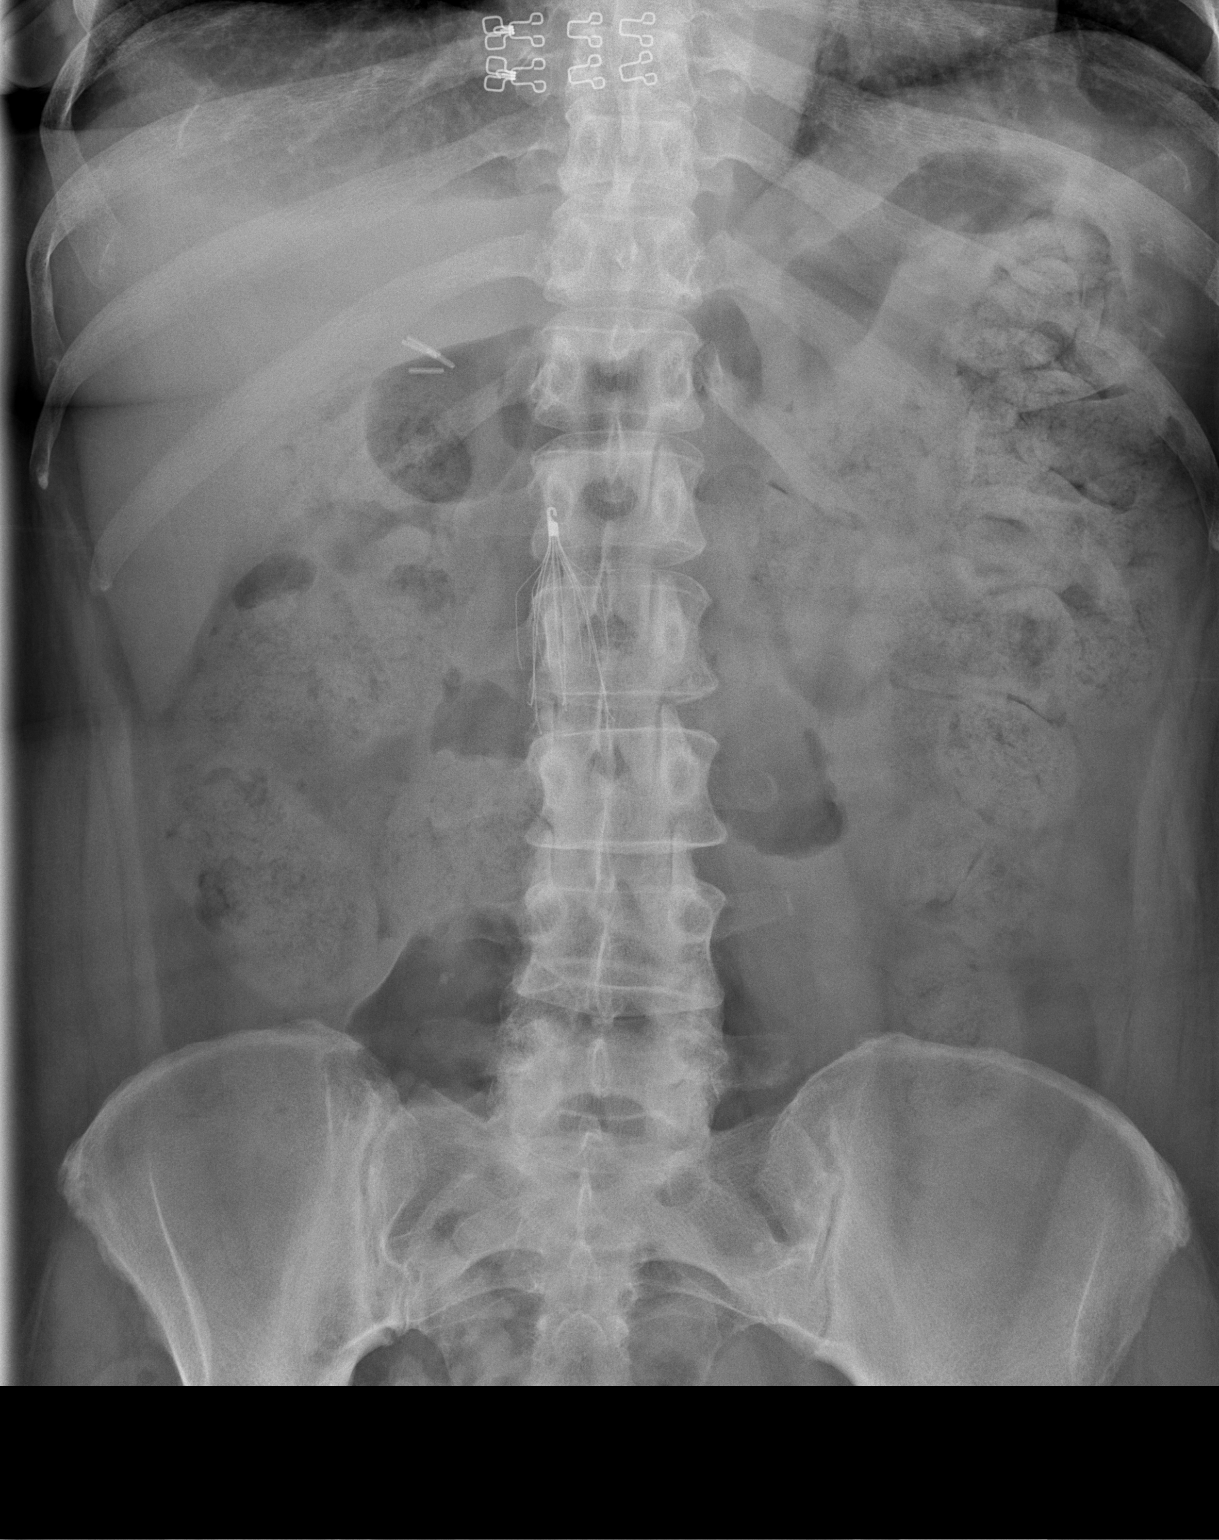

[2 of 2 positions shown; findings below may reference images not displayed]

FINDINGS: A large amount of stool is again noted throughout the colon. There
is no bowel wall thickening or small bowel distension. There is no
supine evidence of free intraperitoneal air. Cholecystectomy clips,
IVC filter, pelvic phleboliths and left total hip arthroplasty
noted. There are mild right hip degenerative changes.
IMPRESSION: Prominent stool throughout the colon consistent with constipation.
No evidence of bowel obstruction.

## 2016-12-16 NOTE — Telephone Encounter (Signed)
Pt called back wondering why this still has not been filled. Please advise, thank you!  Call pt @ [phone]

## 2016-12-16 NOTE — Telephone Encounter (Signed)
Last Ov 11/11/16 last filled  Carvedilol 11/11/16 60 0rf by Mable Paris diltiazem 09/03/16 60 1rf by Dr Bary Leriche

## 2016-12-16 NOTE — Telephone Encounter (Signed)
Patient has requested a medication refill for carvedilol and diltiazem  Pharmacy [place]  Pt contact [phone]

## 2016-12-17 ENCOUNTER — Other Ambulatory Visit: Payer: Self-pay | Admitting: Family

## 2016-12-17 DIAGNOSIS — Z760 Encounter for issue of repeat prescription: Secondary | ICD-10-CM

## 2016-12-17 MED ORDER — CARVEDILOL 3.125 MG PO TABS: 3.1250 mg | ORAL_TABLET | Freq: Two times a day (BID) | ORAL | 0 refills | Status: DC

## 2016-12-17 MED ORDER — DILTIAZEM HCL ER 60 MG PO CP12: 60.0000 mg | ORAL_CAPSULE | Freq: Two times a day (BID) | ORAL | 1 refills | Status: DC

## 2016-12-17 NOTE — Telephone Encounter (Signed)
Medication has been refilled.

## 2016-12-18 ENCOUNTER — Other Ambulatory Visit: Payer: Self-pay | Admitting: Family Medicine

## 2016-12-18 MED ORDER — DILTIAZEM HCL ER 60 MG PO CP12: 60.0000 mg | ORAL_CAPSULE | Freq: Two times a day (BID) | ORAL | 1 refills | Status: DC

## 2016-12-18 NOTE — Addendum Note (Signed)
Addended by: Juanda Chance on: 12/18/2016 09:31 AM   Modules accepted: Orders

## 2016-12-19 NOTE — Telephone Encounter (Signed)
This was just filled on 12/03/16, please advise for refill, thanks

## 2016-12-23 ENCOUNTER — Other Ambulatory Visit: Payer: Self-pay | Admitting: Family Medicine

## 2016-12-23 ENCOUNTER — Telehealth: Payer: Self-pay | Admitting: Family Medicine

## 2016-12-23 NOTE — Telephone Encounter (Signed)
Pt is out of a couple medication and Dr.Kapur will not see her do to pt having medicaid.Marland Kitchen Please call pt she wanted to talk to about the situation. Please advise pt.. she was un sure of medication. Please advise

## 2016-12-23 NOTE — Telephone Encounter (Signed)
Left message to return call 

## 2016-12-23 NOTE — Telephone Encounter (Signed)
Please advise, DR.Nicolasa Ducking will not see patient due to insurance per patient

## 2016-12-23 NOTE — Telephone Encounter (Signed)
Refill sent to pharmacy. I'll forward to [facility] as well as I am not going to chronically prescribe her Zyprexa. We'll see if we can get her in to see a another psychiatrist.

## 2016-12-24 NOTE — Telephone Encounter (Signed)
Left message to return call 

## 2016-12-24 NOTE — Telephone Encounter (Signed)
Pt called returning your call. Thank you!  Call pt @ [phone]

## 2016-12-25 NOTE — Telephone Encounter (Signed)
Pt called back returning your call. Thank you! °

## 2016-12-25 NOTE — Telephone Encounter (Signed)
Patient notified that we did send in a refill for her medications but we will not be able to manage this. We are working on a referral for patient to see new psychiatrist.

## 2016-12-25 NOTE — Telephone Encounter (Signed)
Patient notified

## 2016-12-28 IMAGING — CR DG ABDOMEN 2V
1 series · 3 of 3 positions shown · non-contrast
Comparison: 02/08/2015

CLINICAL DATA: Generalized abdominal pain.

EXAM:
ABDOMEN - 2 VIEW

[Series 1: dg abd 2 views · 0.14mm/px · 3 of 3 slices shown]
[im 1/3]
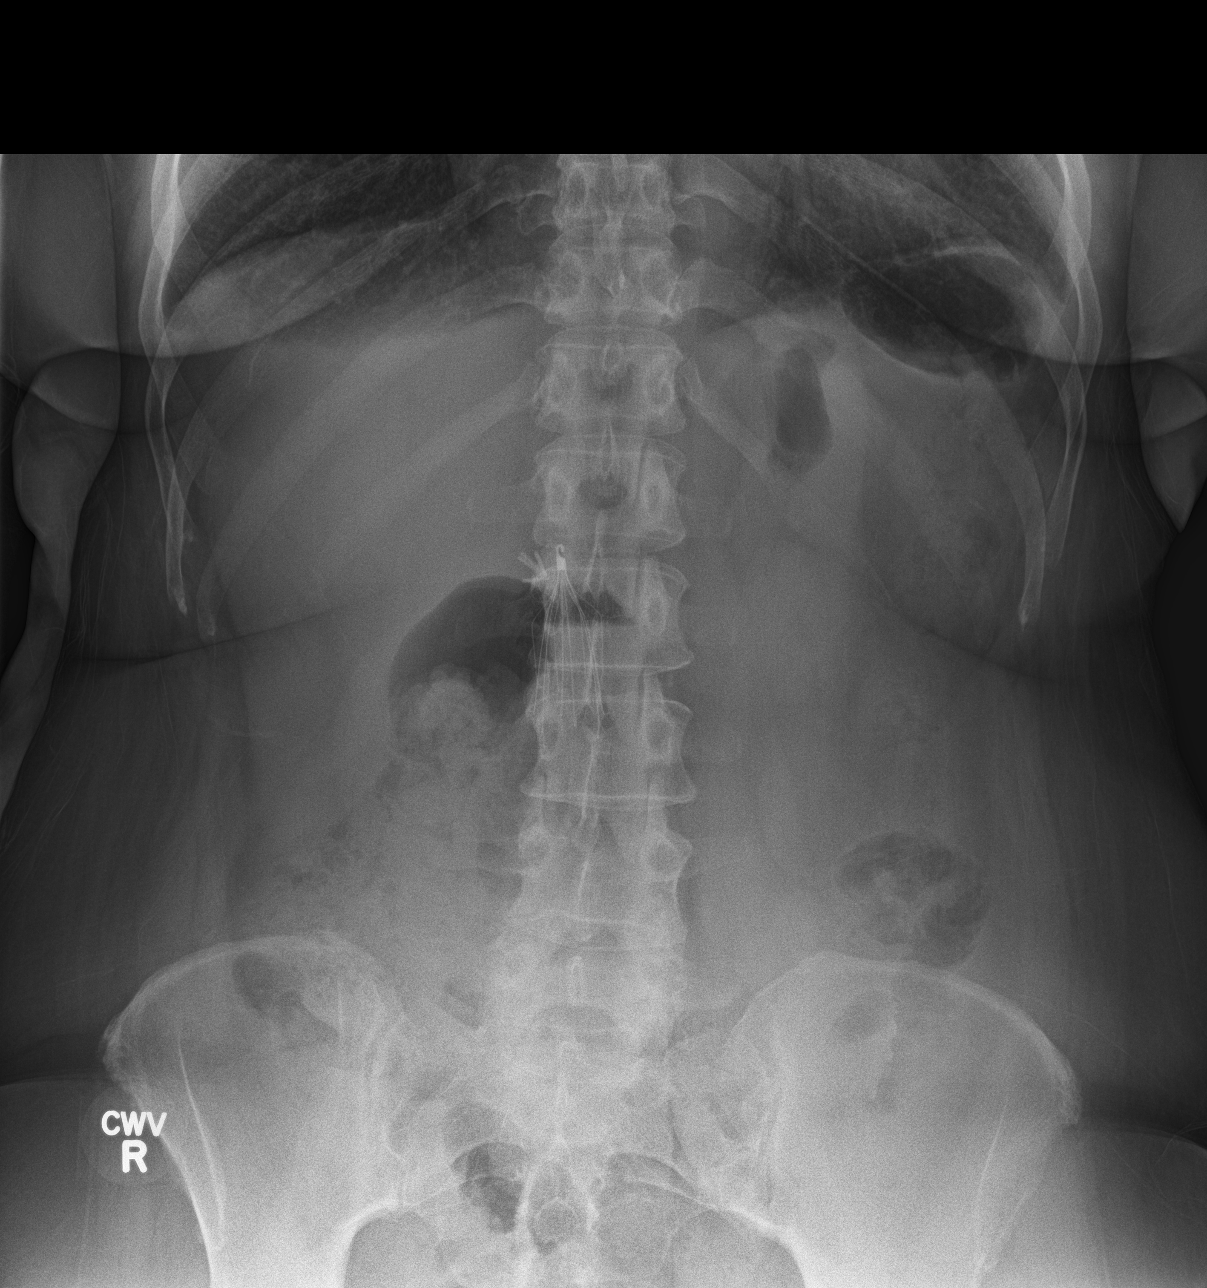
[im 2/3]
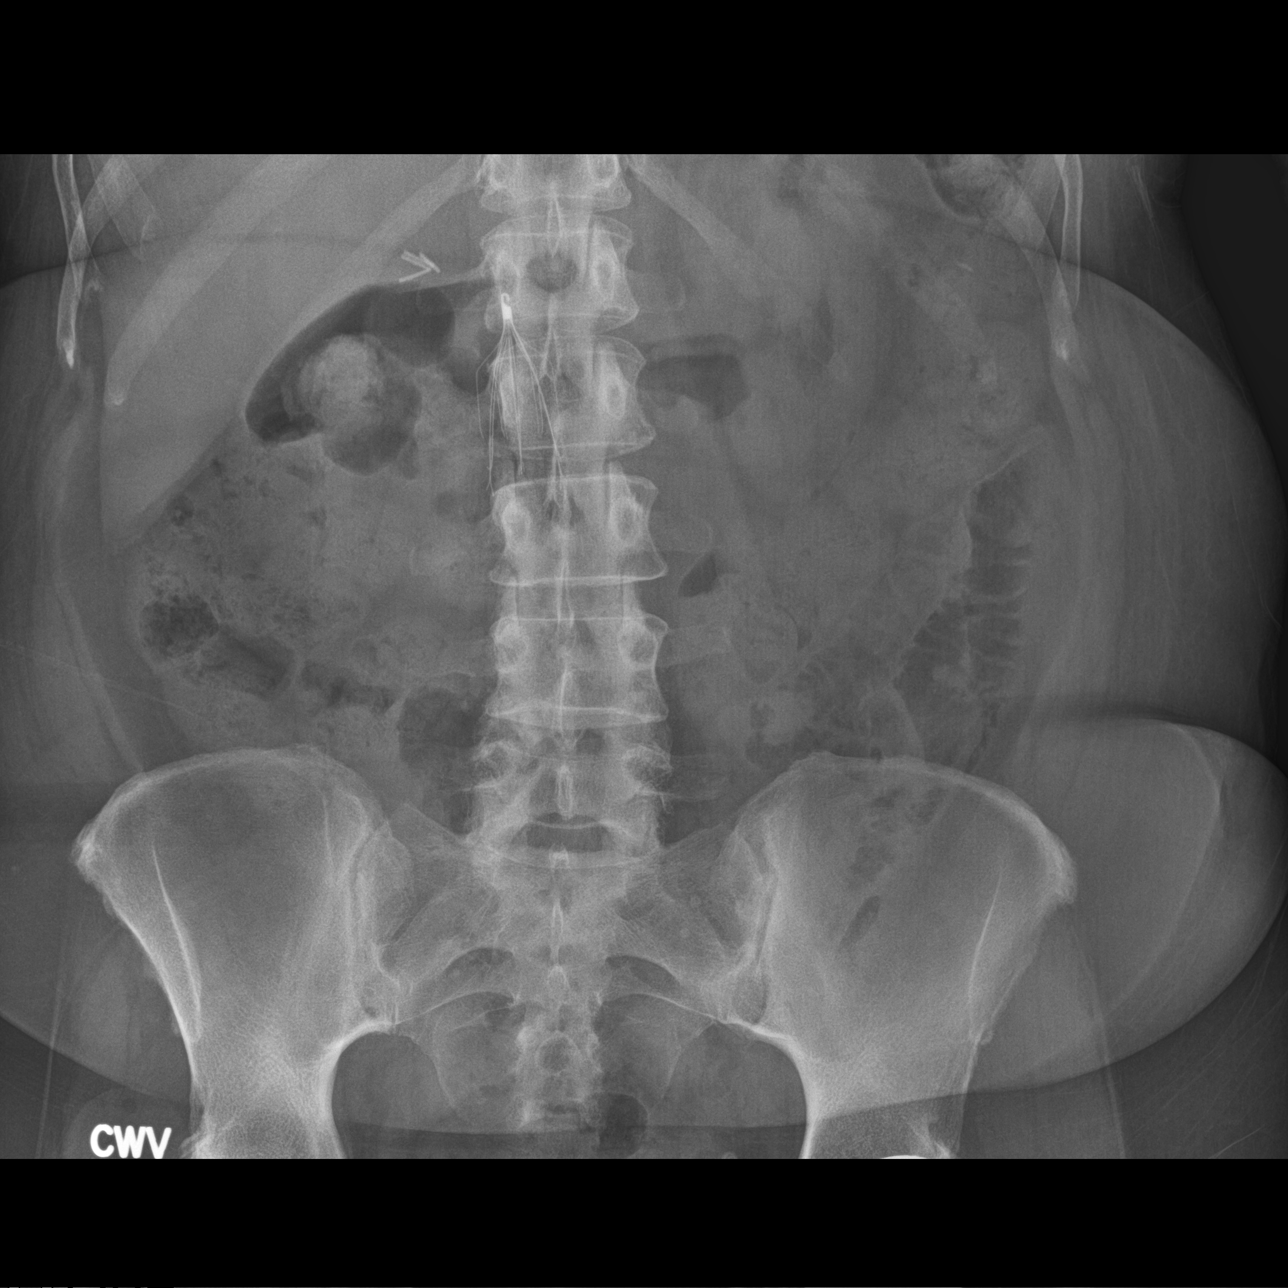
[im 3/3]
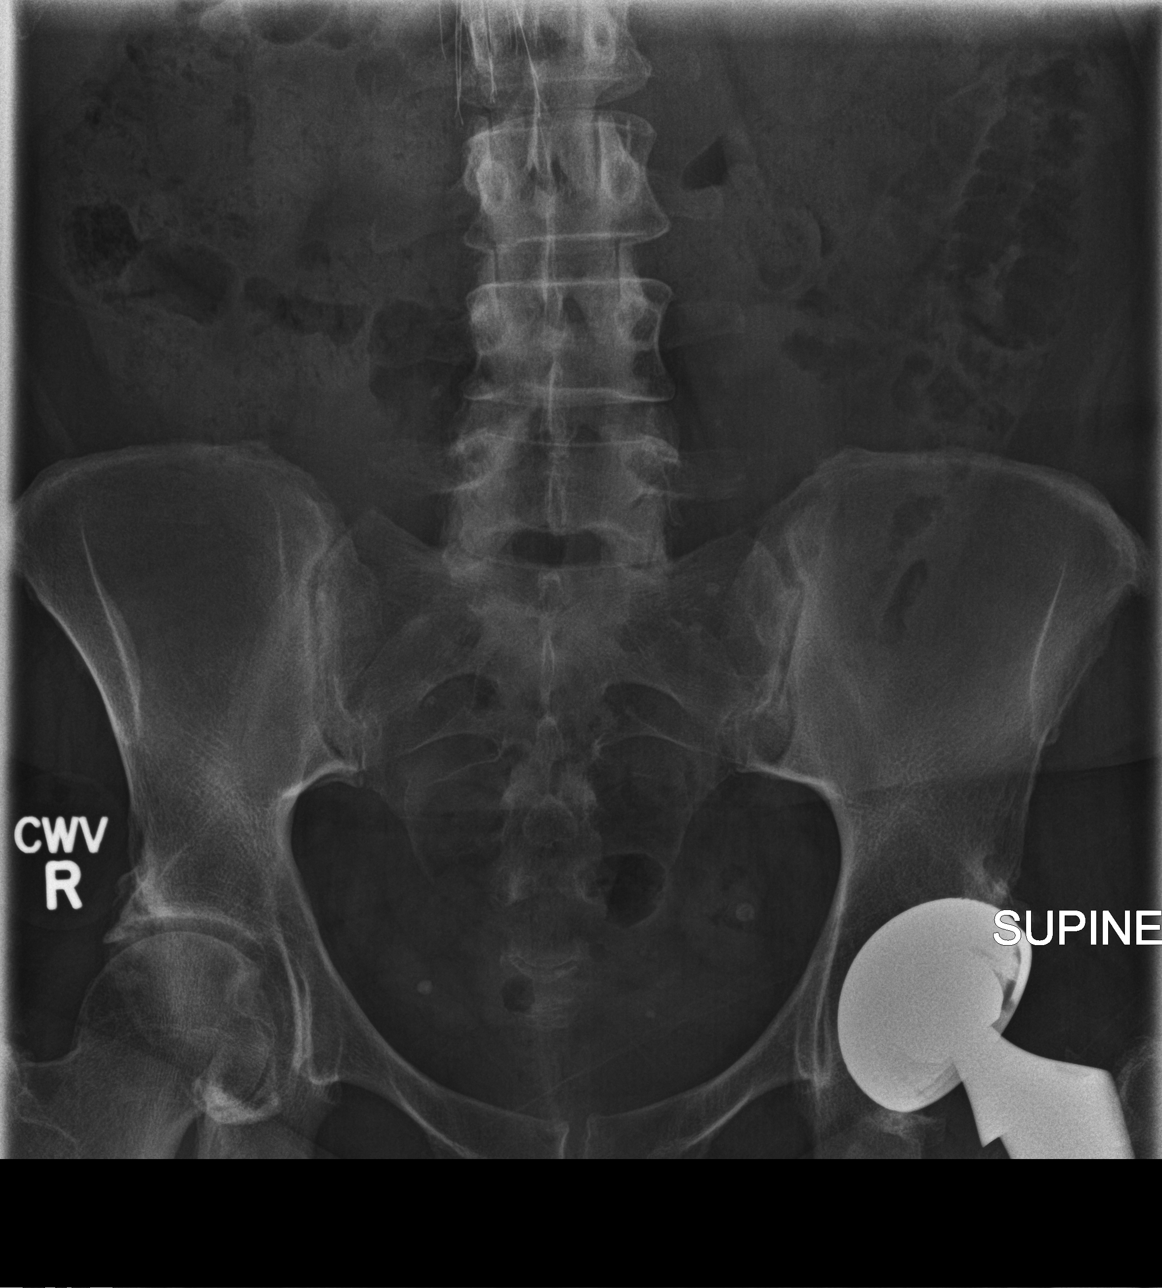

[3 of 3 positions shown; findings below may reference images not displayed]

FINDINGS: Prior cholecystectomy. IVC filter in place. Nonobstructive bowel gas
pattern with moderate stool in the colon. No free air organomegaly.
No acute bony abnormality. Visualized lung bases clear.
IMPRESSION: No acute findings.

## 2017-01-04 ENCOUNTER — Other Ambulatory Visit: Payer: Self-pay | Admitting: Psychiatry

## 2017-01-07 ENCOUNTER — Other Ambulatory Visit: Payer: Self-pay | Admitting: Family Medicine

## 2017-01-07 DIAGNOSIS — Z760 Encounter for issue of repeat prescription: Secondary | ICD-10-CM

## 2017-01-07 NOTE — Telephone Encounter (Signed)
Pt called requesting refills on her cyclobenzaprine (FLEXERIL) 10 MG tablet, ondansetron (ZOFRAN) 4 MG tablet, pantoprazole (PROTONIX) 40 MG tablet,escitalopram (LEXAPRO) 5 MG tablet  and OLANZapine (ZYPREXA) 10 MG tablet. Pt will run out medication before Monday. Pt states that they are still trying to find a psychiatrist for her. The previous one had issues with her insurance. Please advise, thank you  Pharmacy - [id number] [name], [place]

## 2017-01-07 NOTE — Telephone Encounter (Signed)
Last OV 10/06/16 last filled Flexeril  12/19/16 30 0rf zyprexa 12/23/16 30 1rf patient notified she has refill zofran 11/19/16 20 0rf patient notified to contact GI lexapro 12/23/16 90 1rf patient notified she has refill protonix filled by Mable Paris 11/11/16 60 0rf  Notified patient she should hear soon about referral per [facility]

## 2017-01-08 ENCOUNTER — Telehealth: Payer: Self-pay | Admitting: Family Medicine

## 2017-01-08 ENCOUNTER — Other Ambulatory Visit: Payer: Self-pay | Admitting: Family Medicine

## 2017-01-08 ENCOUNTER — Other Ambulatory Visit: Payer: Self-pay | Admitting: Family

## 2017-01-08 DIAGNOSIS — Z760 Encounter for issue of repeat prescription: Secondary | ICD-10-CM

## 2017-01-08 NOTE — Telephone Encounter (Signed)
Left pt message asking to call Allison back directly at [phone] to schedule AWV. Thanks! °

## 2017-01-09 ENCOUNTER — Ambulatory Visit (INDEPENDENT_AMBULATORY_CARE_PROVIDER_SITE_OTHER): Payer: Medicare Other | Admitting: Cardiovascular Disease

## 2017-01-09 ENCOUNTER — Encounter: Payer: Self-pay | Admitting: Cardiovascular Disease

## 2017-01-09 VITALS — BP 118/80 | HR 95 | Ht 66.0 in | Wt 217.0 lb

## 2017-01-09 DIAGNOSIS — Z760 Encounter for issue of repeat prescription: Secondary | ICD-10-CM

## 2017-01-09 DIAGNOSIS — I5032 Chronic diastolic (congestive) heart failure: Secondary | ICD-10-CM | POA: Diagnosis not present

## 2017-01-09 DIAGNOSIS — I10 Essential (primary) hypertension: Secondary | ICD-10-CM

## 2017-01-09 DIAGNOSIS — E7800 Pure hypercholesterolemia, unspecified: Secondary | ICD-10-CM

## 2017-01-09 DIAGNOSIS — I201 Angina pectoris with documented spasm: Secondary | ICD-10-CM

## 2017-01-09 MED ORDER — DILTIAZEM HCL ER 60 MG PO CP12: 60.0000 mg | ORAL_CAPSULE | Freq: Two times a day (BID) | ORAL | 11 refills | Status: DC

## 2017-01-09 MED ORDER — CARVEDILOL 3.125 MG PO TABS: 3.1250 mg | ORAL_TABLET | Freq: Two times a day (BID) | ORAL | 11 refills | Status: DC

## 2017-01-09 MED ORDER — ISOSORBIDE MONONITRATE ER 30 MG PO TB24: 30.0000 mg | ORAL_TABLET | Freq: Every day | ORAL | 11 refills | Status: DC

## 2017-01-09 NOTE — Progress Notes (Signed)
Chief Complaint  Patient presents with  . Shortness of Breath    History of Present Illness: 61 yo female with history of schizoaffective disorder, DM, HTN, diastolic CHF and multiple episodes of PE/DVT now on Eliquis with IVC filter who is here today for follow up. She was admitted to [facility] in [place] 2016 with chest pain. Her initial EKG was normal but she had dynamic ST changes when chest pain recurred so a code STEMI was called. Emergent cardiac cath with no evidence of CAD. Troponin was negative. She was started on Diltiazem and Imdur for possible vasospasm. Admitted to [facility] in January 2018 with chest pain. Nuclear stress test January 2018 with no ischemia.   She is here today for follow up. The patient denies any chest pain, dyspnea, palpitations, lower extremity edema, orthopnea, PND, dizziness, near syncope or syncope.    Primary Care Physician: [name], MD   Past Medical History:  Diagnosis Date  . Anemia   . Bilateral renal cysts   . Chronic back pain    "upper and lower" (09/19/2014)  . Depression    "major" (09/19/2014)  . DVT (deep venous thrombosis) ([place])   . Esophageal spasm   . Gallstones   . Gastritis   . Gastroenteritis   . GERD (gastroesophageal reflux disease)   . Headache    "couple /month lately" (09/19/2014)  . High blood pressure   . High cholesterol   . History of blood transfusion 1985   related to hysterectomy  . Hypertension   . IBS (irritable bowel syndrome)   . Migraine    "a few times/yr" (09/19/2014)  . Myocardial infarction ([place]) 10/2010 X 3   "while hospitalized"  . Osteoarthritis    "qwhere; mostly around my joints" (09/19/2014)  . Pneumonia 04/2014  . PTSD (post-traumatic stress disorder)   . Pulmonary embolism ([place]) 04/2001; 09/19/2014   "after gallbladder OR; "  . Schizoaffective disorder ([place])   . Sleep apnea    "mild" (09/19/2014)  . Snoring    a. sleep study 5/16: No OSA  . Spinal stenosis   . Spondylosis   . Type II  diabetes mellitus ([place])    "dx'd in 2007; lost weight; no RX for ~ 4 yr now" (09/19/2014)    Past Surgical History:  Procedure Laterality Date  . ABDOMINAL HYSTERECTOMY  1985  . ANTERIOR CERVICAL DECOMP/DISCECTOMY FUSION  10/2012  . APPENDECTOMY  1985  . BACK SURGERY    . BREAST CYST EXCISION Right   . BREAST EXCISIONAL BIOPSY Right YRS AGO   NEG  . CARDIAC CATHETERIZATION   2000's; 2009  . CHOLECYSTECTOMY  2002  . COLONOSCOPY WITH PROPOFOL N/A 12/12/2016   Procedure: COLONOSCOPY WITH PROPOFOL;  Surgeon: [name], MD;  Location[facility] ENDOSCOPY;  Service: Endoscopy;  Laterality: N/A;  . EXCISION/RELEASE BURSA HIP Right 12/1984  . HERNIA REPAIR  1985  . LEFT HEART CATHETERIZATION WITH CORONARY ANGIOGRAM N/A 12/09/2014   Procedure: LEFT HEART CATHETERIZATION WITH CORONARY ANGIOGRAM;  Surgeon: Burnell Blanks, MD;  Location: [facility] CATH LAB;  Service: Cardiovascular;  Laterality: N/A;  . TONSILLECTOMY AND ADENOIDECTOMY  ~ 1968  . TOTAL HIP ARTHROPLASTY Left 04-06-2014  . UMBILICAL HERNIA REPAIR  1985  . VENA CAVA FILTER PLACEMENT  03/2014    Current Outpatient Prescriptions  Medication Sig Dispense Refill  . albuterol (PROVENTIL HFA;VENTOLIN HFA) 108 (90 Base) MCG/ACT inhaler Inhale 2 puffs into the lungs every 6 (six) hours as needed for wheezing or shortness  of breath.    Marland Kitchen apixaban (ELIQUIS) 5 MG TABS tablet Take 1 tablet (5 mg total) by mouth 2 (two) times daily. 60 tablet 5  . carvedilol (COREG) 3.125 MG tablet Take 1 tablet (3.125 mg total) by mouth 2 (two) times daily with a meal. 60 tablet 0  . chlorpheniramine-HYDROcodone (TUSSIONEX PENNKINETIC ER) 10-8 MG/5ML SUER Take 5 mLs by mouth at bedtime as needed for cough. 115 mL 0  . cyclobenzaprine (FLEXERIL) 10 MG tablet TAKE 0.5 TABLET (5MG  TOTAL) BY MOUTH 3 TIMES A DAY AS NEEDED FOR MUSCLE SPASM 30 tablet 0  . dicyclomine (BENTYL) 20 MG tablet TAKE 1 TABLET BY MOUTH 3 TIMES A DAY AS NEEDED FOR SPASMS 90 tablet 1  . diltiazem  (CARDIZEM SR) 60 MG 12 hr capsule Take 1 capsule (60 mg total) by mouth every 12 (twelve) hours. 60 capsule 1  . diphenhydrAMINE (BENADRYL) 25 MG tablet Take 1 tablet (25 mg total) by mouth every 6 (six) hours. 12 tablet 0  . escitalopram (LEXAPRO) 5 MG tablet TAKE 3 TABLET BY MOUTH ONCE A DAY 90 tablet 1  . furosemide (LASIX) 20 MG tablet Take 20 mg by mouth daily as needed.    . gabapentin (NEURONTIN) 600 MG tablet Take 1 tablet (600 mg total) by mouth 2 (two) times daily. 180 tablet 1  . isosorbide mononitrate (IMDUR) 30 MG 24 hr tablet TAKE 1 TABLET BY MOUTH EVERY DAY 30 tablet 1  . OLANZapine (ZYPREXA) 10 MG tablet TAKE 1 TABLET BY MOUTH EVERY NIGHT 30 tablet 1  . ondansetron (ZOFRAN) 4 MG tablet TAKE 1 TABLET (4 MG TOTAL) BY MOUTH EVERY 8 (EIGHT) HOURS AS NEEDED FOR NAUSEA OR VOMITING. 20 tablet 0  . pantoprazole (PROTONIX) 40 MG tablet Take 1 tablet (40 mg total) by mouth daily. 60 tablet 0  . polyethylene glycol (MIRALAX / GLYCOLAX) packet Take 17 g by mouth daily. Mix one tablespoon with 8oz of your favorite juice or water every day until you are having soft formed stools. Then start taking once daily if you didn't have a stool the day before. 30 each 0  . potassium chloride (K-DUR) 10 MEQ tablet Take 10 mEq by mouth daily as needed.    . triamcinolone cream (KENALOG) 0.1 % Apply 1 application topically 2 (two) times daily. 30 g 0   No current facility-administered medications for this visit.     Allergies  Allergen Reactions  . Abilify [Aripiprazole] Other (See Comments)    Jerking movement, slow motor skills  . Augmentin [Amoxicillin-Pot Clavulanate] Diarrhea and Nausea And Vomiting  . Bactrim [Sulfamethoxazole-Trimethoprim] Diarrhea  . Ceftin [Cefuroxime Axetil] Diarrhea  . Coconut Oil     Rash   . Diclofenac Other (See Comments)     Muscle Pain  . Dye Fdc Red [Red Dye]     "hair dye"  . Indocin [Indomethacin] Other (See Comments)    Migraines   . Linaclotide Diarrhea  .  Lisinopril Diarrhea and Other (See Comments)    Other reaction(s): Dizziness, Vomiting  . Morphine Other (See Comments)    Chest Tightness  . Morphine And Related Other (See Comments)    Chest tightness   . Simvastatin Other (See Comments)    Muscle Pain  . Sulfa Antibiotics Diarrhea  . Sulfamethoxazole-Trimethoprim Diarrhea  . Wellbutrin [Bupropion] Other (See Comments)    Jerking movement Slurred speech  . Quetiapine Fumarate Palpitations    Social History   Social History  . Marital status: Divorced  Spouse name: N/A  . Number of children: N/A  . Years of education: N/A   Occupational History  . Not on file.   Social History Main Topics  . Smoking status: Former Smoker    Packs/day: 1.50    Years: 10.00    Types: Cigarettes    Quit date: 04/12/1979  . Smokeless tobacco: Never Used  . Alcohol use No  . Drug use: No  . Sexual activity: Not Currently   Other Topics Concern  . Not on file   Social History Narrative   Lives with fiancee in an apartment on the first floor.  Has 3 grown children.  One child passed away.  On disability 1994/12/12 - 2000, 2007 - current.     Education: some college.    Family History  Problem Relation Age of Onset  . Stroke Mother        Deceased  . Lung cancer Father        Deceased  . Healthy Daughter   . Healthy Son        x 2  . Other Son        Suicide  . Heart attack Neg Hx     Review of Systems:  As stated in the HPI and otherwise negative.   BP 118/80   Pulse 95   Ht 5\' 6"  (1.676 m)   Wt 217 lb (98.4 kg)   BMI 35.02 kg/m   Physical Examination: General: Well developed, well nourished, NAD  HEENT: OP clear, mucus membranes moist  SKIN: warm, dry. No rashes. Neuro: No focal deficits  Musculoskeletal: Muscle strength 5/5 all ext  Psychiatric: Mood and affect normal  Neck: No JVD, no carotid bruits, no thyromegaly, no lymphadenopathy.  Lungs:Clear bilaterally, no wheezes, rhonci, crackles Cardiovascular: Regular  rate and rhythm. No murmurs, gallops or rubs. Abdomen:Soft. Bowel sounds present. Non-tender.  Extremities: No lower extremity edema. Pulses are 2 + in the bilateral DP/PT.   EKG:  EKG is not ordered today. The ekg ordered today demonstrates   Recent Labs: 04/05/2016: Magnesium 2.2 08/29/2016: TSH 2.435 09/29/2016: ALT 25 11/26/2016: B Natriuretic Peptide 20.0; BUN 14; Creatinine, Ser 0.83; Hemoglobin 10.7; Platelets 353; Potassium 3.8; Sodium 141   Lipid Panel    Component Value Date/Time   CHOL 251 (H) 08/29/2016 0721   TRIG 83 08/29/2016 0721   HDL 38 (L) 08/29/2016 0721   CHOLHDL 6.6 08/29/2016 0721   VLDL 17 08/29/2016 0721   LDLCALC 196 (H) 08/29/2016 0721     Wt Readings from Last 3 Encounters:  01/09/17 217 lb (98.4 kg)  12/12/16 210 lb (95.3 kg)  11/26/16 210 lb (95.3 kg)     Other studies Reviewed: Additional studies/ records that were reviewed today include: . Review of the above records demonstrates:    Assessment and Plan:   1. Coronary vasospasm: She has had no recurrent chest pain. She is tolerating Coreg, Cardizem and Imdur.   2. HTN: BP controlled. No changes.   3. HLD: Managed by primary care.  4. History of Pulmonary embolism: She remains on Eliquis. This is managed by primary care.  5. Chronic diastolic CHF: Weight is stable. Lasix prn.   Current medicines are reviewed at length with the patient today.  The patient does not have concerns regarding medicines.  The following changes have been made:  no change  Labs/ tests ordered today include:  No orders of the defined types were placed in this encounter.   Disposition:  FU with me in 12 months.   Signed, Lauree Chandler, MD 01/09/2017 3:02 PM    [facility] [address], [place], [place]  [address] Phone: [phone]; Fax: [phone]

## 2017-01-09 NOTE — Patient Instructions (Signed)

## 2017-01-10 MED ORDER — CYCLOBENZAPRINE HCL 10 MG PO TABS: ORAL_TABLET | ORAL | 0 refills | Status: DC

## 2017-01-10 MED ORDER — ESCITALOPRAM OXALATE 5 MG PO TABS: ORAL_TABLET | ORAL | 1 refills | Status: DC

## 2017-01-10 MED ORDER — OLANZAPINE 10 MG PO TABS: 10.0000 mg | ORAL_TABLET | Freq: Every day | ORAL | 1 refills | Status: DC

## 2017-01-10 MED ORDER — ONDANSETRON HCL 4 MG PO TABS: 4.0000 mg | ORAL_TABLET | Freq: Three times a day (TID) | ORAL | 0 refills | Status: DC | PRN

## 2017-01-10 MED ORDER — PANTOPRAZOLE SODIUM 40 MG PO TBEC: 40.0000 mg | DELAYED_RELEASE_TABLET | Freq: Every day | ORAL | 0 refills | Status: DC

## 2017-01-12 ENCOUNTER — Ambulatory Visit (INDEPENDENT_AMBULATORY_CARE_PROVIDER_SITE_OTHER): Payer: Medicare Other | Admitting: Family Medicine

## 2017-01-12 ENCOUNTER — Encounter: Payer: Self-pay | Admitting: Family Medicine

## 2017-01-12 VITALS — BP 122/84 | HR 80 | Temp 98.3°F | Wt 216.5 lb

## 2017-01-12 DIAGNOSIS — K224 Dyskinesia of esophagus: Secondary | ICD-10-CM

## 2017-01-12 DIAGNOSIS — R1011 Right upper quadrant pain: Secondary | ICD-10-CM | POA: Diagnosis not present

## 2017-01-12 DIAGNOSIS — I201 Angina pectoris with documented spasm: Secondary | ICD-10-CM

## 2017-01-12 DIAGNOSIS — M545 Low back pain: Secondary | ICD-10-CM

## 2017-01-12 DIAGNOSIS — G8929 Other chronic pain: Secondary | ICD-10-CM | POA: Diagnosis not present

## 2017-01-12 DIAGNOSIS — M48061 Spinal stenosis, lumbar region without neurogenic claudication: Secondary | ICD-10-CM | POA: Diagnosis not present

## 2017-01-12 DIAGNOSIS — Z205 Contact with and (suspected) exposure to viral hepatitis: Secondary | ICD-10-CM | POA: Diagnosis not present

## 2017-01-12 DIAGNOSIS — K580 Irritable bowel syndrome with diarrhea: Secondary | ICD-10-CM | POA: Diagnosis not present

## 2017-01-12 NOTE — Assessment & Plan Note (Signed)
Intermittently has been an issue over the last month. Mild tenderness on exam. She is status post cholecystectomy. Does have prior exposure to hepatitis C. We will check a CMP and CBC as well as a hepatitis C antibody. Given return precautions.

## 2017-01-12 NOTE — Progress Notes (Signed)
[name], MD Phone: [phone]  Valerie Vazquez is a 61 y.o. female who presents today for follow-up.   Right upper quadrant pain: Patient notes at times over the last month she's had a fairly significant sharp discomfort in her right upper quadrant. She notes no associated vomiting or diarrhea. Some nausea. No chest pain or shortness of breath. Has a bowel movement every other day. Comes on for about 5-10 minutes and then goes away on its own. Not associated with anything in particular. No fevers. She is status post cholecystectomy, appendectomy, and hysterectomy. Does have prior exposure to hepatitis C though prior testing was negative.  Low back pain: Patient has had this for years. Gradually gets worse. No radiation. No weakness. Does note some mild numbness in her buttocks and over her left anterior leg though no saddle anesthesia. These areas of numbness have not changed recently. No bladder incontinence. Does note some chronic stool incontinence over the last 6 or so months that is mildly worse than previously. She was seen a little over a year ago for similar issues with back pain, stool incontinence though she had bladder incontinence and saddle anesthesia at that time. She had an MRI that time did not reveal any pathologic cause for these issues. She was evaluated by neurology and neurosurgery for this issue at that time. She does have chronic issues with IBS and intermittent diarrhea. She additionally had an MRI in March of this year was reassuring as well. She was evaluated by neurosurgery at that time at [facility]. Patient notes chronic neck issues. Feels as though her neck pops and cracks. No pain. Did have a fusion of her lower cervical spine.  Esophageal spasms: Has been having these for a long time. Had negative stress test earlier this year. Had a catheterization in 2016 with normal coronaries. Recently saw cardiology. She gets symptoms out of the blue that do not radiate and are  not associated with any shortness of breath. They are not exertional. Typically occur right after eating. Has not discussed this with GI per her report. Is taking her Eliquis.  PMH: Former smoker.   ROS see history of present illness  Objective  Physical Exam Vitals:   01/12/17 1552  BP: 122/84  Pulse: 80  Temp: 98.3 F (36.8 C)    BP Readings from Last 3 Encounters:  01/12/17 122/84  01/09/17 118/80  12/12/16 (!) 143/67   Wt Readings from Last 3 Encounters:  01/12/17 216 lb 8 oz (98.2 kg)  01/09/17 217 lb (98.4 kg)  12/12/16 210 lb (95.3 kg)    Physical Exam  Constitutional: No distress.  Cardiovascular: Normal rate, regular rhythm and normal heart sounds.   Pulmonary/Chest: Effort normal and breath sounds normal.  Abdominal: Soft. Bowel sounds are normal. She exhibits no distension. There is tenderness (mild right upper quadrant tenderness). There is no rebound and no guarding.  Genitourinary:  Genitourinary Comments: Patient declined rectal exam  Musculoskeletal: She exhibits no edema.  No midline spine tenderness, no midline spine step-off, no muscular back tenderness  Neurological: She is alert. Gait normal.  5/5 strength in bilateral biceps, triceps, grip, quads, hamstrings, plantar and dorsiflexion, sensation to light touch intact in bilateral UE and LE  Skin: Skin is warm and dry. She is not diaphoretic.     Assessment/Plan: Please see individual problem list.  Esophageal spasm Continues to intermittently have issues with this. Only occurs after eating. Recently saw cardiology and also had a negative stress test earlier this  year. Doubt cardiac cause or VTE related. Benign lung exam. She will discuss further with GI tomorrow at her follow-up to see about any further workup.  IBS (irritable bowel syndrome) Chronic history of this. Suspect some of her stool incontinence may be related to GI issues. She had workup of similar symptoms last year with MRI of her  low back that was unremarkable for cause of her stool issues. She declined rectal exam today. Discussed having her speak with GI tomorrow as scheduled to see if they felt this was related to a GI issue.  Lumbar central spinal stenosis (moderate at L4-5; mild at L2-3 and L3-4) Patient with chronic intermittent low back pain. MRIs of her lumbar spine on several occasions have not revealed any cause for her stool incontinence. I doubt this is related to her low back pain given multiple normal MRIs. We will have her do physical therapy for her low back to see if this is beneficial. Given return precautions.  RUQ pain Intermittently has been an issue over the last month. Mild tenderness on exam. She is status post cholecystectomy. Does have prior exposure to hepatitis C. We will check a CMP and CBC as well as a hepatitis C antibody. Given return precautions.   Orders Placed This Encounter  Procedures  . Comp Met (CMET)  . Hepatitis C Antibody  . CBC  . Ambulatory referral to Physical Therapy    Referral Priority:   Routine    Referral Type:   Physical Medicine    Referral Reason:   Specialty Services Required    Requested Specialty:   Physical Therapy    Number of Visits Requested:   1   [name], MD [place]

## 2017-01-12 NOTE — Patient Instructions (Signed)
Nice to see you. We'll get lab work today and contact you with the results. We will refer you to physical therapy as well. Please discuss your bowel issues with GI when you see them tomorrow. If you develop worsening back pain, numbness, weakness, change in bowel function, loss of bladder function, numbness between your legs, or any new or changing symptoms please seek medical attention.

## 2017-01-12 NOTE — Assessment & Plan Note (Signed)
Patient with chronic intermittent low back pain. MRIs of her lumbar spine on several occasions have not revealed any cause for her stool incontinence. I doubt this is related to her low back pain given multiple normal MRIs. We will have her do physical therapy for her low back to see if this is beneficial. Given return precautions.

## 2017-01-12 NOTE — Assessment & Plan Note (Signed)
Continues to intermittently have issues with this. Only occurs after eating. Recently saw cardiology and also had a negative stress test earlier this year. Doubt cardiac cause or VTE related. Benign lung exam. She will discuss further with GI tomorrow at her follow-up to see about any further workup.

## 2017-01-12 NOTE — Assessment & Plan Note (Signed)
Chronic history of this. Suspect some of her stool incontinence may be related to GI issues. She had workup of similar symptoms last year with MRI of her low back that was unremarkable for cause of her stool issues. She declined rectal exam today. Discussed having her speak with GI tomorrow as scheduled to see if they felt this was related to a GI issue.

## 2017-01-13 ENCOUNTER — Encounter: Payer: Self-pay | Admitting: Gastroenterology

## 2017-01-13 ENCOUNTER — Telehealth: Payer: Self-pay

## 2017-01-13 ENCOUNTER — Ambulatory Visit (INDEPENDENT_AMBULATORY_CARE_PROVIDER_SITE_OTHER): Payer: Medicare Other | Admitting: Gastroenterology

## 2017-01-13 VITALS — BP 110/72 | HR 79 | Temp 98.3°F | Ht 66.0 in | Wt 217.0 lb

## 2017-01-13 DIAGNOSIS — K5900 Constipation, unspecified: Secondary | ICD-10-CM

## 2017-01-13 DIAGNOSIS — R1312 Dysphagia, oropharyngeal phase: Secondary | ICD-10-CM | POA: Diagnosis not present

## 2017-01-13 DIAGNOSIS — Z86010 Personal history of colonic polyps: Secondary | ICD-10-CM | POA: Diagnosis not present

## 2017-01-13 DIAGNOSIS — K219 Gastro-esophageal reflux disease without esophagitis: Secondary | ICD-10-CM

## 2017-01-13 DIAGNOSIS — I201 Angina pectoris with documented spasm: Secondary | ICD-10-CM | POA: Diagnosis not present

## 2017-01-13 LAB — CBC
HCT: 33.5 % — ABNORMAL LOW (ref 36.0–46.0)
Hemoglobin: 11.2 g/dL — ABNORMAL LOW (ref 12.0–15.0)
MCHC: 33.4 g/dL (ref 30.0–36.0)
MCV: 90.7 fl (ref 78.0–100.0)
Platelets: 384 10*3/uL (ref 150.0–400.0)
RBC: 3.69 Mil/uL — ABNORMAL LOW (ref 3.87–5.11)
RDW: 14.5 % (ref 11.5–15.5)
WBC: 6.7 10*3/uL (ref 4.0–10.5)

## 2017-01-13 LAB — COMPREHENSIVE METABOLIC PANEL
ALT: 17 U/L (ref 0–35)
AST: 22 U/L (ref 0–37)
Albumin: 4.1 g/dL (ref 3.5–5.2)
Alkaline Phosphatase: 91 U/L (ref 39–117)
BUN: 15 mg/dL (ref 6–23)
CO2: 29 mEq/L (ref 19–32)
Calcium: 9.6 mg/dL (ref 8.4–10.5)
Chloride: 104 mEq/L (ref 96–112)
Creatinine, Ser: 0.97 mg/dL (ref 0.40–1.20)
GFR: 74.65 mL/min (ref >60.00)
Glucose, Bld: 84 mg/dL (ref 70–99)
Potassium: 4.2 mEq/L (ref 3.5–5.1)
Sodium: 138 mEq/L (ref 135–145)
Total Bilirubin: 0.3 mg/dL (ref 0.2–1.2)
Total Protein: 7.4 g/dL (ref 6.0–8.3)

## 2017-01-13 LAB — HEPATITIS C ANTIBODY: HCV Ab: NEGATIVE

## 2017-01-13 NOTE — Telephone Encounter (Signed)
Left voice mail to call back 

## 2017-01-13 NOTE — Telephone Encounter (Signed)
-----   Message from [name], MD sent at 01/13/2017  6:37 AM EDT ----- Please let the patient know that her Hepatitis C test was negative. Thanks.

## 2017-01-13 NOTE — Telephone Encounter (Signed)
error 

## 2017-01-13 NOTE — Telephone Encounter (Signed)
Patient notified

## 2017-01-13 NOTE — Progress Notes (Signed)
Primary Care Physician: [name], MD  Primary Gastroenterologist:  Dr. [name]   Chief Complaint  Patient presents with  . Follow-up    HPI: Valerie Vazquez is a 61 y.o. female   She is here today for a follow up .   Summary of history : I saw her at the endoscopy unit on 12/12/16 when she came in for  Colonoscopy due to prior history of colon polyps./ She was on Eloquis at thjat time.. I did find a 6 mm polyp in the cecum which was not excised as she was on eloquis at that time and had not stopped it.    Interval history   12/12/2016-  01/13/2017  Labs 12/2016- CMP-normal  She was seen by Dr Caryl Bis yesterday and his note mentioned she will be seeing me today for esophageal spasms, IBS . She has a history of lumbar canal stenosis. CT abdomen in 09/2016 showed no acute abnormality.   She says she has had some RUQ pain, off and on for a month, occurs 1-2 times a day, lasts for a few minutes, localized, does not wake her up from her sleep , eating or drinking she cant be sure. Pain relived after a bowel movement , not affected by passage of gas. She has a bowel movement once every few days, very hard when she has the pain , she does alternate between constipation and diarrhea. No NSAID's .   She has issues with swallowing - in the past one month feels "something is crushing her windpipe when I am eating " " happened when I was eating steak and it was sitting there and I tried coughing up and it cleared", points to her neck where it gets stuck . Has also gone the "wrong way " and " start coughing" . She has gained weight 15 lbs recently and her symptoms have got worse too in the same time. She has been under a lot of financial stress recently. She takes Protonix daily after food.     Current Outpatient Prescriptions  Medication Sig Dispense Refill  . albuterol (PROVENTIL HFA;VENTOLIN HFA) 108 (90 Base) MCG/ACT inhaler Inhale 2 puffs into the lungs every 6 (six) hours as  needed for wheezing or shortness of breath.    Marland Kitchen apixaban (ELIQUIS) 5 MG TABS tablet Take 1 tablet (5 mg total) by mouth 2 (two) times daily. 60 tablet 5  . carvedilol (COREG) 3.125 MG tablet Take 1 tablet (3.125 mg total) by mouth 2 (two) times daily with a meal. 60 tablet 11  . cyclobenzaprine (FLEXERIL) 10 MG tablet TAKE 0.5 TABLET (5MG  TOTAL) BY MOUTH 3 TIMES A DAY AS NEEDED FOR MUSCLE SPASM 30 tablet 0  . dicyclomine (BENTYL) 20 MG tablet TAKE 1 TABLET BY MOUTH 3 TIMES A DAY AS NEEDED FOR SPASMS 90 tablet 1  . diltiazem (CARDIZEM SR) 60 MG 12 hr capsule Take 1 capsule (60 mg total) by mouth every 12 (twelve) hours. 60 capsule 11  . diphenhydrAMINE (BENADRYL) 25 MG tablet Take 1 tablet (25 mg total) by mouth every 6 (six) hours. 12 tablet 0  . escitalopram (LEXAPRO) 5 MG tablet TAKE 3 TABLET BY MOUTH ONCE A DAY 90 tablet 1  . furosemide (LASIX) 20 MG tablet Take 20 mg by mouth daily as needed.    . gabapentin (NEURONTIN) 600 MG tablet Take 1 tablet (600 mg total) by mouth 2 (two) times daily. 180 tablet 1  . isosorbide mononitrate (IMDUR) 30 MG 24  hr tablet Take 1 tablet (30 mg total) by mouth daily. 30 tablet 11  . OLANZapine (ZYPREXA) 10 MG tablet Take 1 tablet (10 mg total) by mouth at bedtime. 30 tablet 1  . ondansetron (ZOFRAN) 4 MG tablet Take 1 tablet (4 mg total) by mouth every 8 (eight) hours as needed for nausea or vomiting. 20 tablet 0  . pantoprazole (PROTONIX) 40 MG tablet Take 1 tablet (40 mg total) by mouth daily. 60 tablet 0  . potassium chloride (K-DUR) 10 MEQ tablet Take 10 mEq by mouth daily as needed.    . chlorpheniramine-HYDROcodone (TUSSIONEX PENNKINETIC ER) 10-8 MG/5ML SUER Take 5 mLs by mouth at bedtime as needed for cough. (Patient not taking: Reported on 01/13/2017) 115 mL 0  . polyethylene glycol (MIRALAX / GLYCOLAX) packet Take 17 g by mouth daily. Mix one tablespoon with 8oz of your favorite juice or water every day until you are having soft formed stools. Then start  taking once daily if you didn't have a stool the day before. (Patient not taking: Reported on 01/13/2017) 30 each 0  . triamcinolone cream (KENALOG) 0.1 % Apply 1 application topically 2 (two) times daily. (Patient not taking: Reported on 01/13/2017) 30 g 0   No current facility-administered medications for this visit.     Allergies as of 01/13/2017 - Review Complete 01/13/2017  Allergen Reaction Noted  . Abilify [aripiprazole] Other (See Comments) 09/20/2013  . Augmentin [amoxicillin-pot clavulanate] Diarrhea and Nausea And Vomiting 09/20/2013  . Bactrim [sulfamethoxazole-trimethoprim] Diarrhea 09/20/2013  . Ceftin [cefuroxime axetil] Diarrhea 09/20/2013  . Coconut oil  10/19/2015  . Diclofenac Other (See Comments) 08/01/2014  . Dye fdc red [red dye]  01/22/2016  . Indocin [indomethacin] Other (See Comments) 09/20/2013  . Linaclotide Diarrhea 12/09/2014  . Lisinopril Diarrhea and Other (See Comments) 09/20/2013  . Morphine Other (See Comments) 12/09/2014  . Morphine and related Other (See Comments) 09/20/2013  . Simvastatin Other (See Comments) 08/01/2014  . Sulfa antibiotics Diarrhea 12/09/2014  . Sulfamethoxazole-trimethoprim Diarrhea 12/09/2014  . Wellbutrin [bupropion] Other (See Comments) 09/20/2013  . Quetiapine fumarate Palpitations 08/01/2014    ROS:  General: Negative for anorexia, weight loss, fever, chills, fatigue, weakness. ENT: Negative for hoarseness, difficulty swallowing , nasal congestion. CV: Negative for chest pain, angina, palpitations, dyspnea on exertion, peripheral edema.  Respiratory: Negative for dyspnea at rest, dyspnea on exertion, cough, sputum, wheezing.  GI: See history of present illness. GU:  Negative for dysuria, hematuria, urinary incontinence, urinary frequency, nocturnal urination.  Endo: Negative for unusual weight change.    Physical Examination:   BP 110/72 (BP Location: Left Arm, Patient Position: Sitting, Cuff Size: Large)   Pulse 79    Temp 98.3 F (36.8 C) (Oral)   Ht 5\' 6"  (1.676 m)   Wt 217 lb (98.4 kg)   BMI 35.02 kg/m   General: Well-nourished, well-developed in no acute distress.  Eyes: No icterus. Conjunctivae pink. Mouth: Oropharyngeal mucosa moist and pink , no lesions erythema or exudate. Lungs: Clear to auscultation bilaterally. Non-labored. Heart: Regular rate and rhythm, no murmurs rubs or gallops.  Abdomen: Bowel sounds are normal, nontender, nondistended, no hepatosplenomegaly or masses, no abdominal bruits or hernia , no rebound or guarding.   Extremities: No lower extremity edema. No clubbing or deformities. Neuro: Alert and oriented x 3.  Grossly intact. Skin: Warm and dry, no jaundice.   Psych: Alert and cooperative, normal mood and affect.   Imaging Studies: No results found.  Assessment and Plan:   Valerie Celeste  L Vazquez is a 61 y.o. y/o female here for endoscopy follow up   Impression/plan   1. Colon polyp on eloquis-needs resection  2. Dysphagia -esophageal phase-likely secondary to reflux- takes nexium after meals which is not effective - trial with nexium in the am  3. Transfer dysphagia at times-  4. Constipation likely the cause of RUQ pain   Plan  1. Colonoscopy off Eloquis to excise colon polyp which was not excised recently  2. Modified barium swallow and barium swallow with tablet  3. EGD with possible dilation and biopsies.  4. Take protonix before meals as it is less affected after meals. 5. Colace  daily for constipation  (does not like miralax)  6. High fiber diet  7. GERD patient information   Dr [name]  MD Follow up in 3 months  .

## 2017-01-14 ENCOUNTER — Telehealth: Payer: Self-pay

## 2017-01-14 ENCOUNTER — Other Ambulatory Visit: Payer: Self-pay

## 2017-01-14 DIAGNOSIS — K219 Gastro-esophageal reflux disease without esophagitis: Secondary | ICD-10-CM

## 2017-01-14 DIAGNOSIS — K5900 Constipation, unspecified: Secondary | ICD-10-CM

## 2017-01-14 NOTE — Telephone Encounter (Signed)
LVM for patient callback to reschedule EGD & colonoscopy from June 12th due to over-scheduling.   Also, medical clearance has not been returned from provider.

## 2017-01-15 ENCOUNTER — Other Ambulatory Visit: Payer: Self-pay

## 2017-01-15 DIAGNOSIS — K5900 Constipation, unspecified: Secondary | ICD-10-CM

## 2017-01-15 DIAGNOSIS — K219 Gastro-esophageal reflux disease without esophagitis: Secondary | ICD-10-CM

## 2017-01-20 ENCOUNTER — Ambulatory Visit (INDEPENDENT_AMBULATORY_CARE_PROVIDER_SITE_OTHER): Payer: Medicare Other

## 2017-01-20 VITALS — BP 118/62 | HR 85 | Temp 98.4°F | Resp 14 | Ht 66.0 in | Wt 219.8 lb

## 2017-01-20 DIAGNOSIS — Z0000 Encounter for general adult medical examination without abnormal findings: Secondary | ICD-10-CM | POA: Diagnosis not present

## 2017-01-20 NOTE — Progress Notes (Signed)
Subjective:   BEATRIS BELEN is a 61 y.o. female who presents for an Initial Medicare Annual Wellness Visit.  Review of Systems    No ROS.  Medicare Wellness Visit.  Cardiac Risk Factors include: hypertension;obesity (BMI >30kg/m2)     Objective:    Today's Vitals   01/20/17 1558  BP: 118/62  Pulse: 85  Resp: 14  Temp: 98.4 F (36.9 C)  TempSrc: Oral  SpO2: 95%  Weight: 219 lb 12.8 oz (99.7 kg)  Height: 5\' 6"  (1.676 m)   Body mass index is 35.48 kg/m.   Current Medications (verified) Outpatient Encounter Prescriptions as of 01/20/2017  Medication Sig  . albuterol (PROVENTIL HFA;VENTOLIN HFA) 108 (90 Base) MCG/ACT inhaler Inhale 2 puffs into the lungs every 6 (six) hours as needed for wheezing or shortness of breath.  Marland Kitchen apixaban (ELIQUIS) 5 MG TABS tablet Take 1 tablet (5 mg total) by mouth 2 (two) times daily.  . carvedilol (COREG) 3.125 MG tablet Take 1 tablet (3.125 mg total) by mouth 2 (two) times daily with a meal.  . cyclobenzaprine (FLEXERIL) 10 MG tablet TAKE 0.5 TABLET (5MG  TOTAL) BY MOUTH 3 TIMES A DAY AS NEEDED FOR MUSCLE SPASM  . dicyclomine (BENTYL) 20 MG tablet TAKE 1 TABLET BY MOUTH 3 TIMES A DAY AS NEEDED FOR SPASMS  . diltiazem (CARDIZEM SR) 60 MG 12 hr capsule Take 1 capsule (60 mg total) by mouth every 12 (twelve) hours.  . diphenhydrAMINE (BENADRYL) 25 MG tablet Take 1 tablet (25 mg total) by mouth every 6 (six) hours.  Marland Kitchen escitalopram (LEXAPRO) 5 MG tablet TAKE 3 TABLET BY MOUTH ONCE A DAY  . furosemide (LASIX) 20 MG tablet Take 20 mg by mouth daily as needed.  . gabapentin (NEURONTIN) 600 MG tablet Take 1 tablet (600 mg total) by mouth 2 (two) times daily.  . isosorbide mononitrate (IMDUR) 30 MG 24 hr tablet Take 1 tablet (30 mg total) by mouth daily.  Marland Kitchen OLANZapine (ZYPREXA) 10 MG tablet Take 1 tablet (10 mg total) by mouth at bedtime.  . ondansetron (ZOFRAN) 4 MG tablet Take 1 tablet (4 mg total) by mouth every 8 (eight) hours as needed for nausea  or vomiting.  . pantoprazole (PROTONIX) 40 MG tablet Take 1 tablet (40 mg total) by mouth daily.  . polyethylene glycol (MIRALAX / GLYCOLAX) packet Take 17 g by mouth daily. Mix one tablespoon with 8oz of your favorite juice or water every day until you are having soft formed stools. Then start taking once daily if you didn't have a stool the day before.  . potassium chloride (K-DUR) 10 MEQ tablet Take 10 mEq by mouth daily as needed.  . chlorpheniramine-HYDROcodone (TUSSIONEX PENNKINETIC ER) 10-8 MG/5ML SUER Take 5 mLs by mouth at bedtime as needed for cough. (Patient not taking: Reported on 01/20/2017)  . triamcinolone cream (KENALOG) 0.1 % Apply 1 application topically 2 (two) times daily. (Patient not taking: Reported on 01/20/2017)   No facility-administered encounter medications on file as of 01/20/2017.     Allergies (verified) Abilify [aripiprazole]; Augmentin [amoxicillin-pot clavulanate]; Bactrim [sulfamethoxazole-trimethoprim]; Ceftin [cefuroxime axetil]; Coconut oil; Diclofenac; Dye fdc red [red dye]; Indocin [indomethacin]; Linaclotide; Lisinopril; Morphine; Morphine and related; Simvastatin; Sulfa antibiotics; Sulfamethoxazole-trimethoprim; Wellbutrin [bupropion]; and Quetiapine fumarate   History: Past Medical History:  Diagnosis Date  . Anemia   . Bilateral renal cysts   . Chronic back pain    "upper and lower" (09/19/2014)  . Depression    "major" (09/19/2014)  . DVT (  deep venous thrombosis) ([place])   . Esophageal spasm   . Gallstones   . Gastritis   . Gastroenteritis   . GERD (gastroesophageal reflux disease)   . Headache    "couple /month lately" (09/19/2014)  . High blood pressure   . High cholesterol   . History of blood transfusion 1985   related to hysterectomy  . Hypertension   . IBS (irritable bowel syndrome)   . Migraine    "a few times/yr" (09/19/2014)  . Myocardial infarction ([place]) 10/2010 X 3   "while hospitalized"  . Osteoarthritis    "qwhere; mostly  around my joints" (09/19/2014)  . Pneumonia 04/2014  . PTSD (post-traumatic stress disorder)   . Pulmonary embolism ([place]) 04/2001; 09/19/2014   "after gallbladder OR; "  . Schizoaffective disorder ([place])   . Sleep apnea    "mild" (09/19/2014)  . Snoring    a. sleep study 5/16: No OSA  . Spinal stenosis   . Spondylosis   . Type II diabetes mellitus ([place])    "dx'd in 2007; lost weight; no RX for ~ 4 yr now" (09/19/2014)   Past Surgical History:  Procedure Laterality Date  . ABDOMINAL HYSTERECTOMY  1985  . ANTERIOR CERVICAL DECOMP/DISCECTOMY FUSION  10/2012  . APPENDECTOMY  1985  . BACK SURGERY    . BREAST CYST EXCISION Right   . BREAST EXCISIONAL BIOPSY Right YRS AGO   NEG  . CARDIAC CATHETERIZATION   2000's; 2009  . CHOLECYSTECTOMY  2002  . COLONOSCOPY WITH PROPOFOL N/A 12/12/2016   Procedure: COLONOSCOPY WITH PROPOFOL;  Surgeon: [name], MD;  Location[facility] END[facility]COPY;  Service: Endoscopy;  Laterality: N/A;  . EXCISION/RELEASE BURSA HIP Right 12/1984  . HERNIA REPAIR  1985  . LEFT HEART CATHETERIZATION WITH CORONARY ANGIOGRAM N/A 12/09/2014   Procedure: LEFT HEART CATHETERIZATION WITH CORONARY ANGIOGRAM;  Surgeon: Burnell Blanks, MD;  Location: [facility] CATH LAB;  Service: Cardiovascular;  Laterality: N/A;  . TONSILLECTOMY AND ADENOIDECTOMY  ~ 1968  . TOTAL HIP ARTHROPLASTY Left 04-06-2014  . UMBILICAL HERNIA REPAIR  1985  . VENA CAVA FILTER PLACEMENT  03/2014   Family History  Problem Relation Age of Onset  . Stroke Mother        Deceased  . Lung cancer Father        Deceased  . Healthy Daughter   . Healthy Son        x 2  . Other Son        Suicide  . Heart attack Neg Hx    Social History   Occupational History  . Not on file.   Social History Main Topics  . Smoking status: Former Smoker    Packs/day: 1.50    Years: 10.00    Types: Cigarettes    Quit date: 04/12/1979  . Smokeless tobacco: Never Used  . Alcohol use No  . Drug use: No  . Sexual activity: Not  Currently    Tobacco Counseling Counseling given: Not Answered   Activities of Daily Living In your present state of health, do you have any difficulty performing the following activities: 01/20/2017 09/28/2016  Hearing? N N  Vision? N N  Difficulty concentrating or making decisions? Y N  Walking or climbing stairs? Y N  Dressing or bathing? N N  Doing errands, shopping? N N  Preparing Food and eating ? N -  Using the Toilet? N -  In the past six months, have you accidently leaked urine? Y -  Do  you have problems with loss of bowel control? Y -  Managing your Medications? N -  Managing your Finances? N -  Housekeeping or managing your Housekeeping? Y -  Some recent data might be hidden    Immunizations and Health Maintenance  There is no immunization history on file for this patient. Health Maintenance Due  Topic Date Due  . PNEUMOCOCCAL POLYSACCHARIDE VACCINE (1) 04/02/1956  . FOOT EXAM  04/02/1964  . OPHTHALMOLOGY EXAM  04/02/1964  . URINE MICROALBUMIN  04/02/1964  . HIV Screening  04/02/1969  . TETANUS/TDAP  04/02/1973  . PAP SMEAR  04/03/1975    Patient Care Team: [name], MD as PCP - General (Family Medicine) [place], Eppie Gibson, RN as [place] any recent [place] you may have received from other than [facility] providers in the past year (date may be approximate).     Assessment:   This is a routine wellness examination for Yesika. The goal of the wellness visit is to assist the patient how to close the gaps in care and create a preventative care plan for the patient.   Osteoporosis risk reviewed.  Medications reviewed; taking without issues or barriers.  Safety issues reviewed; currently lives with a friend.  States she plans to move into her own apartment tomorrow. Smoke detectors in the home. Firearms locked up in the home. Wears seatbelts when driving or riding with others. Patient encouraged  to wear sunscreen or protective clothing when in direct sunlight. No violence in the home.  Depression- PHQ 2 &9 complete.  No signs/symptoms or verbal communication regarding little pleasure in doing things, feeling down, depressed or hopeless. Appetite fluctuates. Difficulty with focus/concentration. No changes in sleeping, energy.  No thoughts of self harm or harm towards others.  Time spent on this topic is 13 minutes.   She plans to move into her own apartment tomorrow  Patient is alert, normal appearance, oriented to person/place/and time. Correctly identified the president of the [place], recall of 0/3 words, and performing simple calculations.  Patient displays appropriate judgement and can read correct time from watch face.  No new identified risk were noted.  No failures at ADL's or IADL's.   BMI- discussed the importance of a healthy diet, water intake and exercise. Educational material provided.   24 hour diet  recall: Cereal x 3 bowls Daily fluid intake: 0 cups of caffeine, 1 cups of water, 3 cups koolaid  HTN- followed by PCP.  Dental- she does not have a dentist.  No insurance coverage at this time.  Sleep patterns- Sleeps 6 hours at night.  Wakes feeling rested.  Naps during the day as needed.  TD vaccine deferred per patient preference.  Follow up with insurance.  Educational material provided.  Patient Concerns: Awaiting follow up with psychiatry appointment.  Deferred to PCP for follow up.  Hearing/Vision screen Hearing Screening Comments: Patient has difficulty hearing conversational tones when the television is in use or in crowds. Audiology testing deferred in the last year per patient request.   Vision Screening Comments: Last OV 2013 due to lack of insurance coverage. Wears reading glasses as needed.   Visual acuity not assessed per patient preference.  Dietary issues and exercise activities discussed: Current Exercise Habits: Home exercise routine, Type of  exercise: walking, Frequency (Times/Week): 2, Intensity: Mild  Goals    . Increase physical activity    . Increase water intake    . Reduce sugar intake  Low carb foods      Depression Screen PHQ 2/9 Scores 01/20/2017 08/20/2016 04/08/2016 03/19/2016 01/29/2016 06/05/2015 02/20/2015  PHQ - 2 Score 0 0 2 5 1  0 0  PHQ- 9 Score 4 - 15 22 - - -    Fall Risk Fall Risk  01/20/2017 08/20/2016 01/29/2016 11/12/2015 06/05/2015  Falls in the past year? Yes No No Yes No  Number falls in past yr: 1 - - 2 or more -  Injury with Fall? Yes - - Yes -  Risk Factor Category  - - - - -  Follow up Falls prevention discussed;Education provided - - Falls evaluation completed;Falls prevention discussed -    Cognitive Function: MMSE - Mini Mental State Exam 01/20/2017  Orientation to time 5  Orientation to Place 5  Registration 3  Attention/ Calculation 5  Recall 0  Language- name 2 objects 2  Language- repeat 1  Language- follow 3 step command 3  Language- read & follow direction 1  Write a sentence 1  Copy design 1  Total score 27     6CIT Screen 01/20/2017  What Year? 0 points  What month? 0 points  What time? 0 points  Count back from 20 0 points  Months in reverse 0 points  Repeat phrase 10 points  Total Score 10    Screening Tests Health Maintenance  Topic Date Due  . PNEUMOCOCCAL POLYSACCHARIDE VACCINE (1) 04/02/1956  . FOOT EXAM  04/02/1964  . OPHTHALMOLOGY EXAM  04/02/1964  . URINE MICROALBUMIN  04/02/1964  . HIV Screening  04/02/1969  . TETANUS/TDAP  04/02/1973  . PAP SMEAR  04/03/1975  . INFLUENZA VACCINE  10/06/2017 (Originally 04/01/2017)  . HEMOGLOBIN A1C  02/27/2017  . MAMMOGRAM  02/24/2018  . COLONOSCOPY  12/13/2026  . Hepatitis C Screening  Completed      Plan:   End of life planning; Advanced aging; Advanced directives discussed.  No HCPOA/Living Will.  Additional information provided to help them start the conversation with family.  Copy of HCPOA/Living Will  requested upon completion. Time spent on this topic is 25 minutes.  I have personally reviewed and noted the following in the patient's chart:   . Medical and social history . Use of alcohol, tobacco or illicit drugs  . Current medications and supplements . Functional ability and status . Nutritional status . Physical activity . Advanced directives . List of other physicians . Hospitalizations, surgeries, and ER visits in previous 12 months . Vitals . Screenings to include cognitive, depression, and falls . Referrals and appointments  In addition, I have reviewed and discussed with patient certain preventive protocols, quality metrics, and best practice recommendations. A written personalized care plan for preventive services as well as general preventive health recommendations were provided to patient.     Varney Biles, LPN   11/18/2332

## 2017-01-20 NOTE — Patient Instructions (Addendum)
Valerie Vazquez , Thank you for taking time to come for your Medicare Wellness Visit. I appreciate your ongoing commitment to your health goals. Please review the following plan we discussed and let me know if I can assist you in the future.   Follow up with Dr. Caryl Bis as needed.    Bring a copy of your [place] and/or Living Will to be scanned into chart once completed.  Have a great day!  These are the goals we discussed: Goals    . Increase physical activity    . Increase water intake    . Reduce sugar intake           Low carb foods       This is a list of the screening recommended for you and due dates:  Health Maintenance  Topic Date Due  . Pneumococcal vaccine (1) 04/02/1956  . Complete foot exam   04/02/1964  . Eye exam for diabetics  04/02/1964  . Urine Protein Check  04/02/1964  . HIV Screening  04/02/1969  . Tetanus Vaccine  04/02/1973  . Pap Smear  04/03/1975  . Flu Shot  10/06/2017*  . Hemoglobin A1C  02/27/2017  . Mammogram  02/24/2018  . Colon Cancer Screening  12/13/2026  .  Hepatitis C: One time screening is recommended by Center for Disease Control  (CDC) for  adults born from [birth year] through 1967.   Completed  *Topic was postponed. The date shown is not the original due date.      Fall Prevention in the Home Falls can cause injuries. They can happen to people of all ages. There are many things you can do to make your home safe and to help prevent falls. What can I do on the outside of my home?  Regularly fix the edges of walkways and driveways and fix any cracks.  Remove anything that might make you trip as you walk through a door, such as a raised step or threshold.  Trim any bushes or trees on the path to your home.  Use bright outdoor lighting.  Clear any walking paths of anything that might make someone trip, such as rocks or tools.  Regularly check to see if handrails are loose or broken. Make sure that both sides of any  steps have handrails.  Any raised decks and porches should have guardrails on the edges.  Have any leaves, snow, or ice cleared regularly.  Use sand or salt on walking paths during winter.  Clean up any spills in your garage right away. This includes oil or grease spills. What can I do in the bathroom?  Use night lights.  Install grab bars by the toilet and in the tub and shower. Do not use towel bars as grab bars.  Use non-skid mats or decals in the tub or shower.  If you need to sit down in the shower, use a plastic, non-slip stool.  Keep the floor dry. Clean up any water that spills on the floor as soon as it happens.  Remove soap buildup in the tub or shower regularly.  Attach bath mats securely with double-sided non-slip rug tape.  Do not have throw rugs and other things on the floor that can make you trip. What can I do in the bedroom?  Use night lights.  Make sure that you have a light by your bed that is easy to reach.  Do not use any sheets or blankets that are too big for  your bed. They should not hang down onto the floor.  Have a firm chair that has side arms. You can use this for support while you get dressed.  Do not have throw rugs and other things on the floor that can make you trip. What can I do in the kitchen?  Clean up any spills right away.  Avoid walking on wet floors.  Keep items that you use a lot in easy-to-reach places.  If you need to reach something above you, use a strong step stool that has a grab bar.  Keep electrical cords out of the way.  Do not use floor polish or wax that makes floors slippery. If you must use wax, use non-skid floor wax.  Do not have throw rugs and other things on the floor that can make you trip. What can I do with my stairs?  Do not leave any items on the stairs.  Make sure that there are handrails on both sides of the stairs and use them. Fix handrails that are broken or loose. Make sure that handrails are  as long as the stairways.  Check any carpeting to make sure that it is firmly attached to the stairs. Fix any carpet that is loose or worn.  Avoid having throw rugs at the top or bottom of the stairs. If you do have throw rugs, attach them to the floor with carpet tape.  Make sure that you have a light switch at the top of the stairs and the bottom of the stairs. If you do not have them, ask someone to add them for you. What else can I do to help prevent falls?  Wear shoes that:  Do not have high heels.  Have rubber bottoms.  Are comfortable and fit you well.  Are closed at the toe. Do not wear sandals.  If you use a stepladder:  Make sure that it is fully opened. Do not climb a closed stepladder.  Make sure that both sides of the stepladder are locked into place.  Ask someone to hold it for you, if possible.  Clearly mark and make sure that you can see:  Any grab bars or handrails.  First and last steps.  Where the edge of each step is.  Use tools that help you move around (mobility aids) if they are needed. These include:  Canes.  Walkers.  Scooters.  Crutches.  Turn on the lights when you go into a dark area. Replace any light bulbs as soon as they burn out.  Set up your furniture so you have a clear path. Avoid moving your furniture around.  If any of your floors are uneven, fix them.  If there are any pets around you, be aware of where they are.  Review your medicines with your doctor. Some medicines can make you feel dizzy. This can increase your chance of falling. Ask your doctor what other things that you can do to help prevent falls. This information is not intended to replace advice given to you by your health care provider. Make sure you discuss any questions you have with your health care provider. Document Released: 06/14/2009 Document Revised: 01/24/2016 Document Reviewed: 09/22/2014 Elsevier Interactive Patient Education  2017 [employer].

## 2017-01-27 ENCOUNTER — Telehealth: Payer: Self-pay | Admitting: *Deleted

## 2017-01-27 IMAGING — DX DG CHEST 2V
2 series · 2 of 2 positions shown · non-contrast
Comparison: March 03, 2015.

CLINICAL DATA: Chest pain.

EXAM:
CHEST  2 VIEW

[w chest lat]
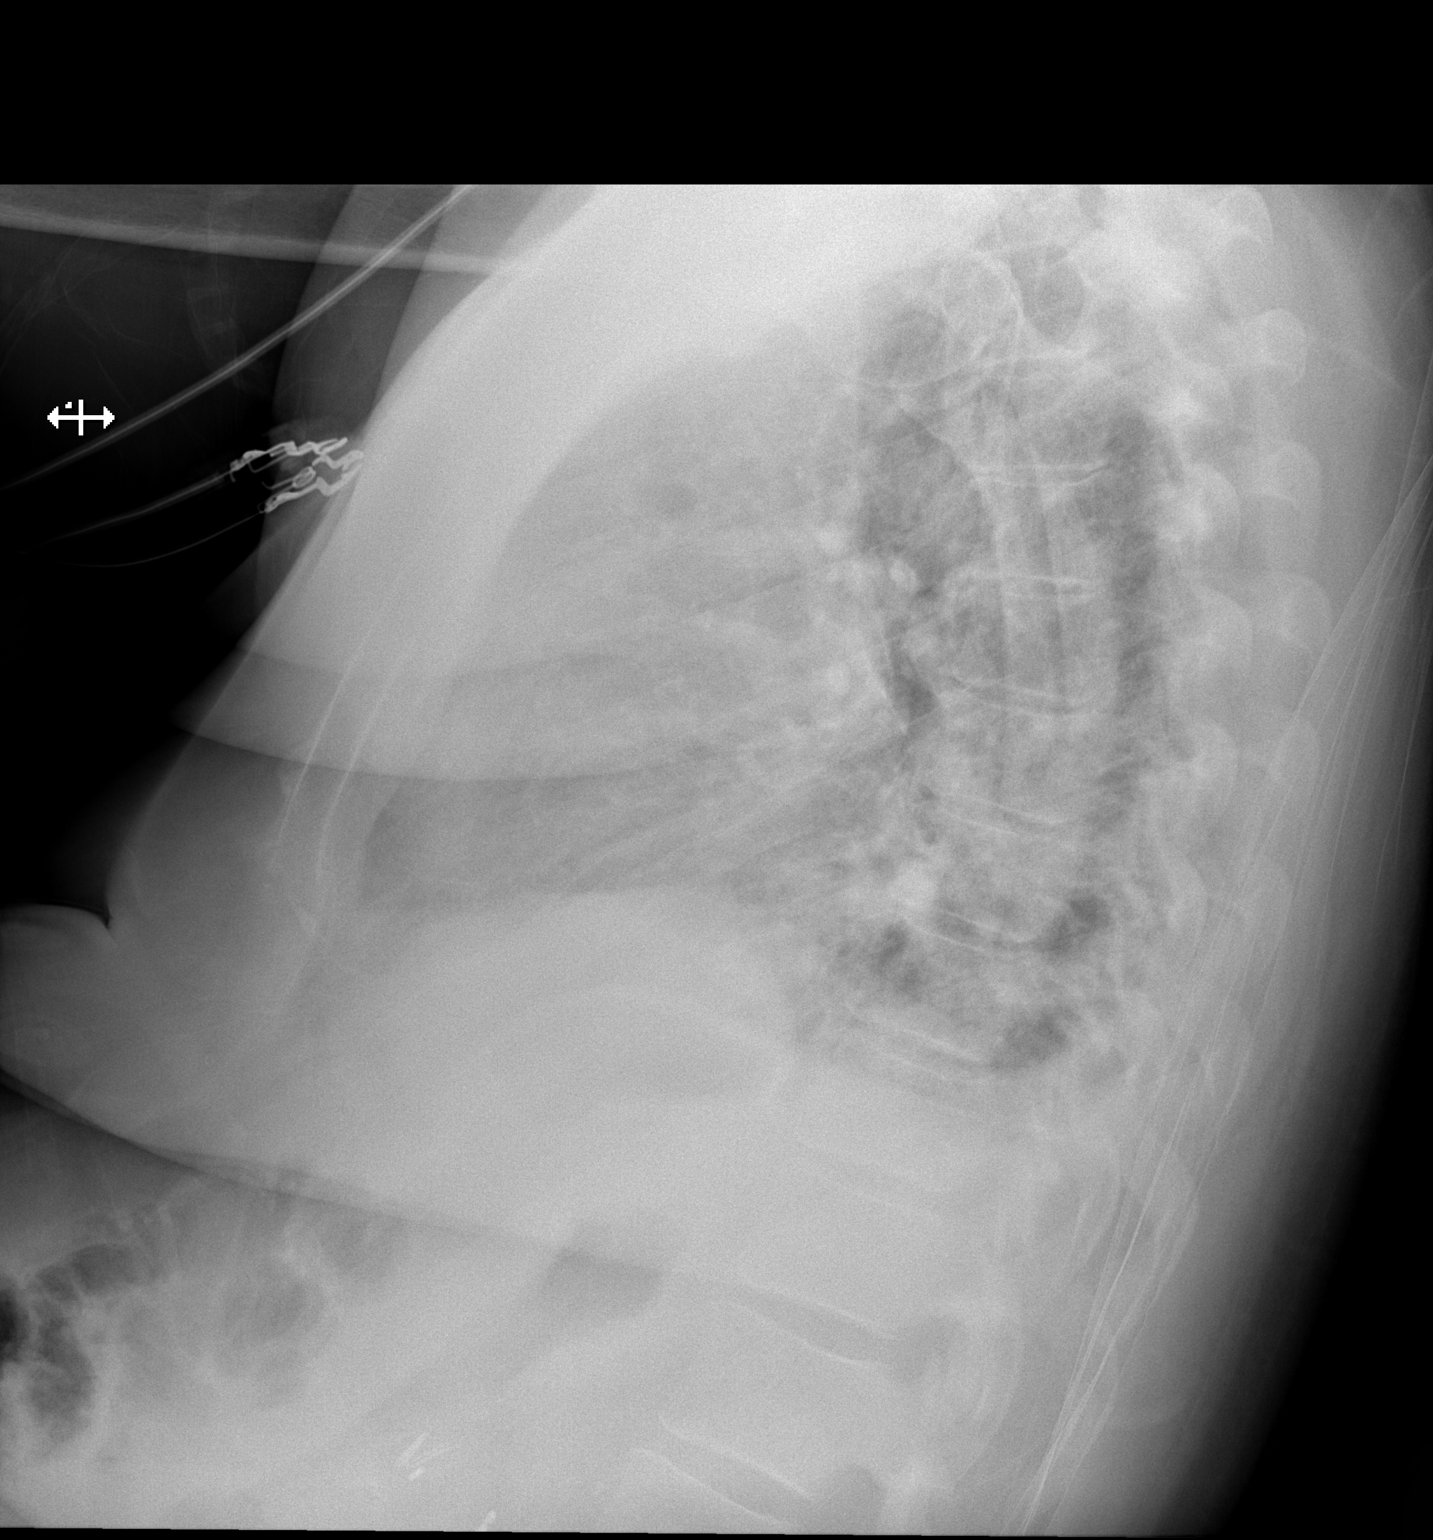

[x chest ap]
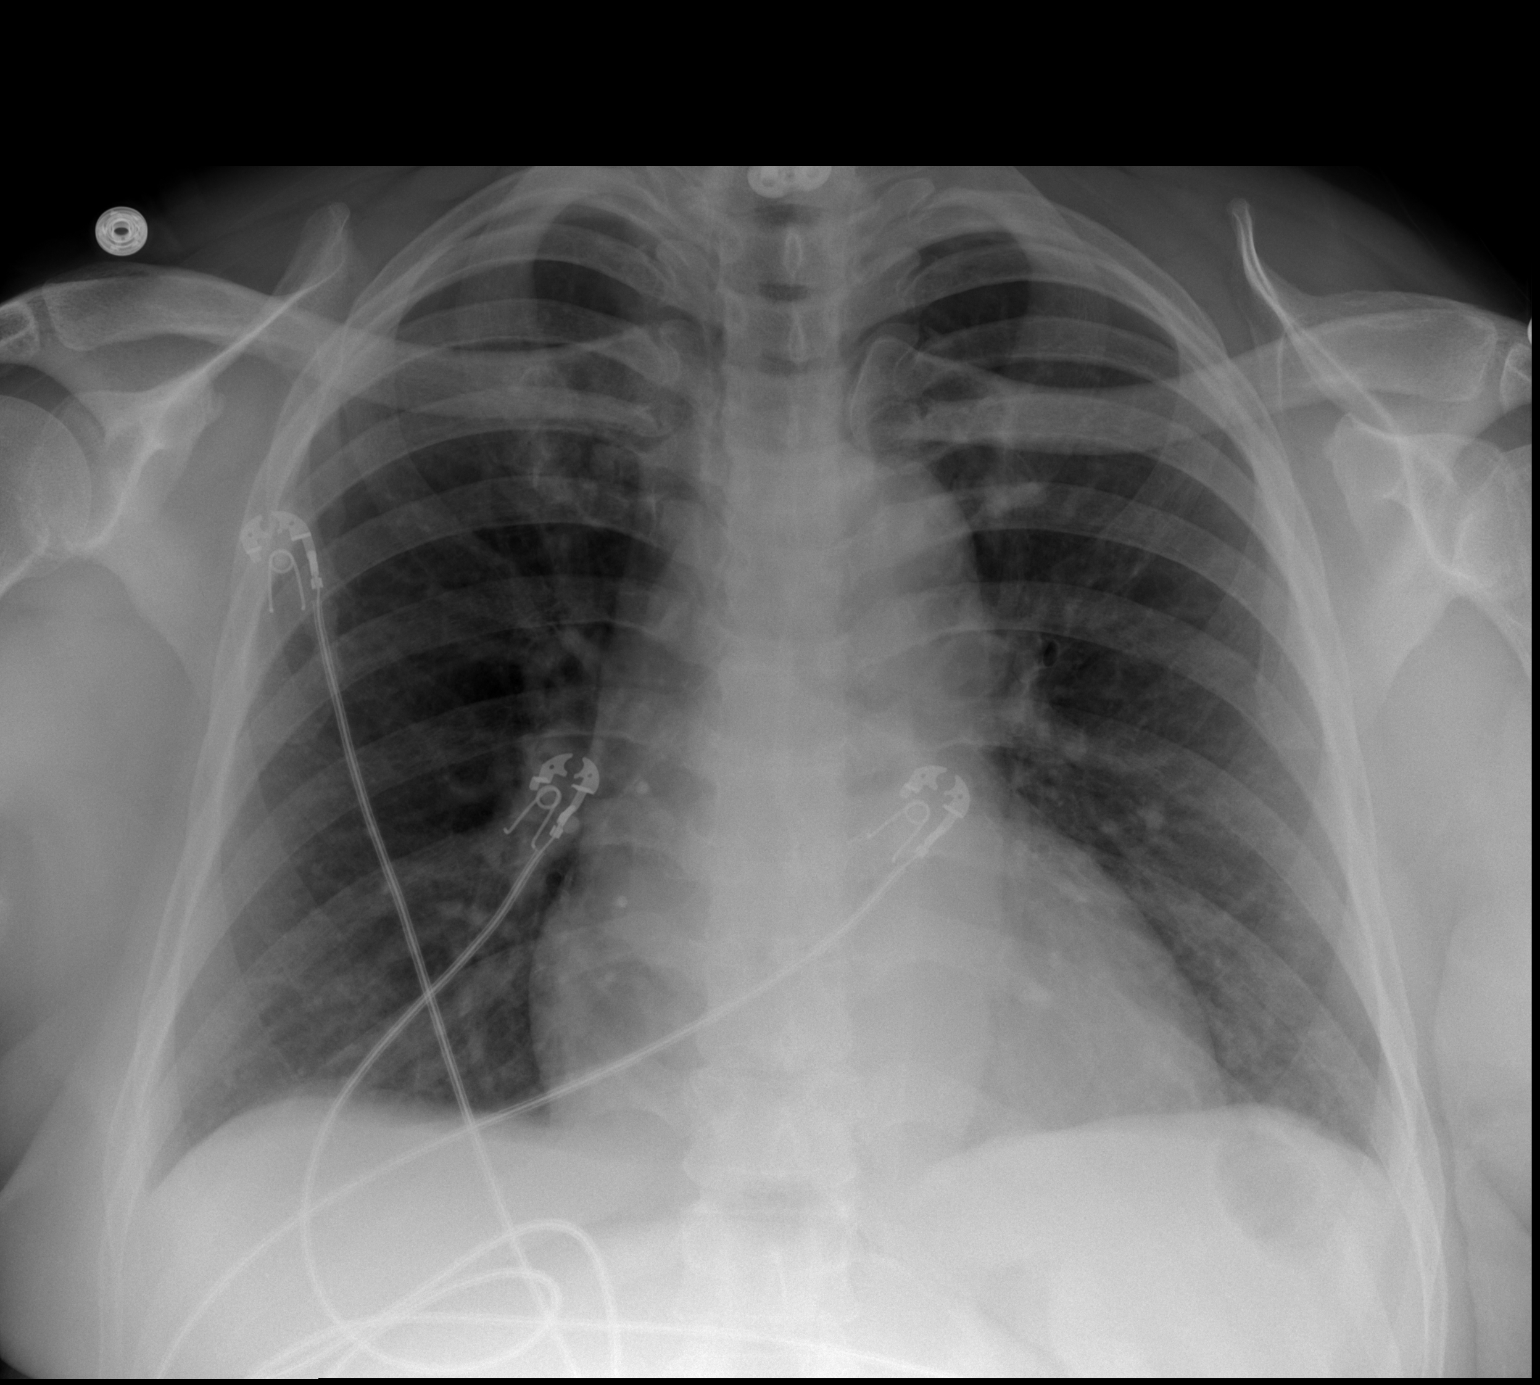

[2 of 2 positions shown; findings below may reference images not displayed]

FINDINGS: The heart size and mediastinal contours are within normal limits.
Both lungs are clear. No pneumothorax is noted. Minimal bilateral
pleural effusions are noted. The visualized skeletal structures are
unremarkable.
IMPRESSION: Minimal bilateral pleural effusions. No other abnormality seen in
the chest.

## 2017-01-27 IMAGING — DX DG SHOULDER 2+V*L*
2 series · 2 of 2 positions shown · non-contrast
Comparison: None.

CLINICAL DATA: Left shoulder pain, no known injury, initial
encounter

EXAM:
LEFT SHOULDER - 2+ VIEW

[x shoulder ap left]
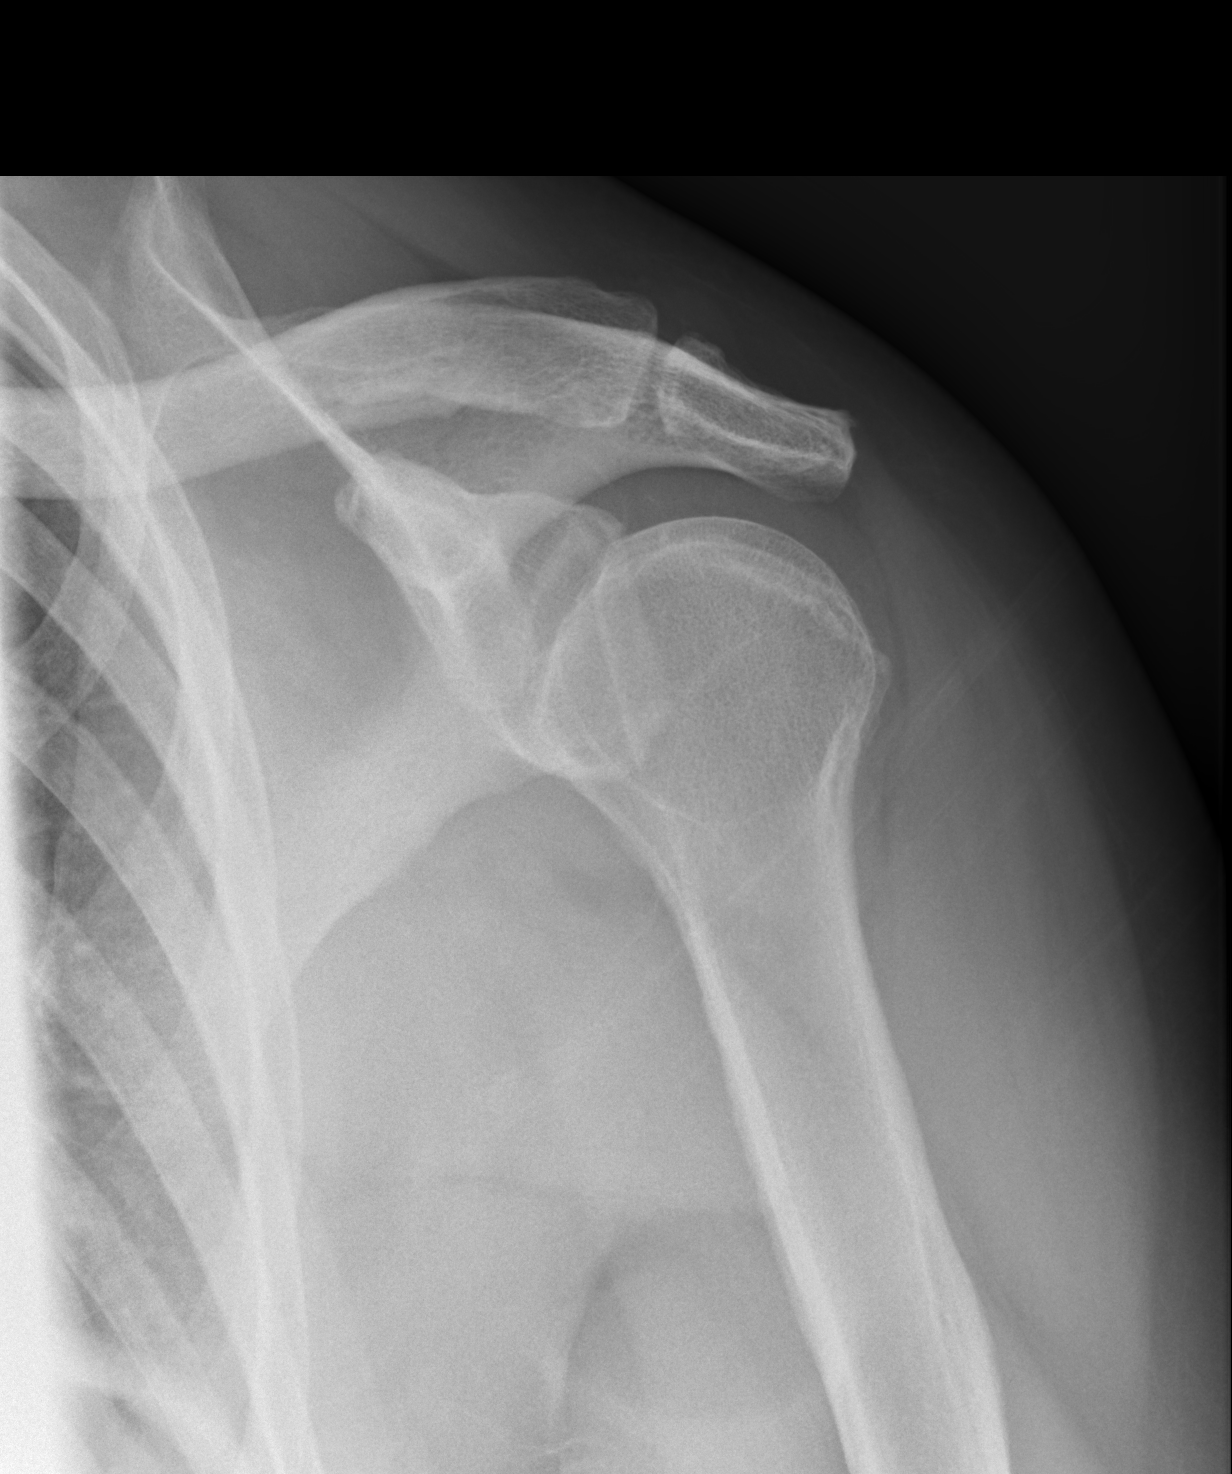

[x shoulder axillary left]
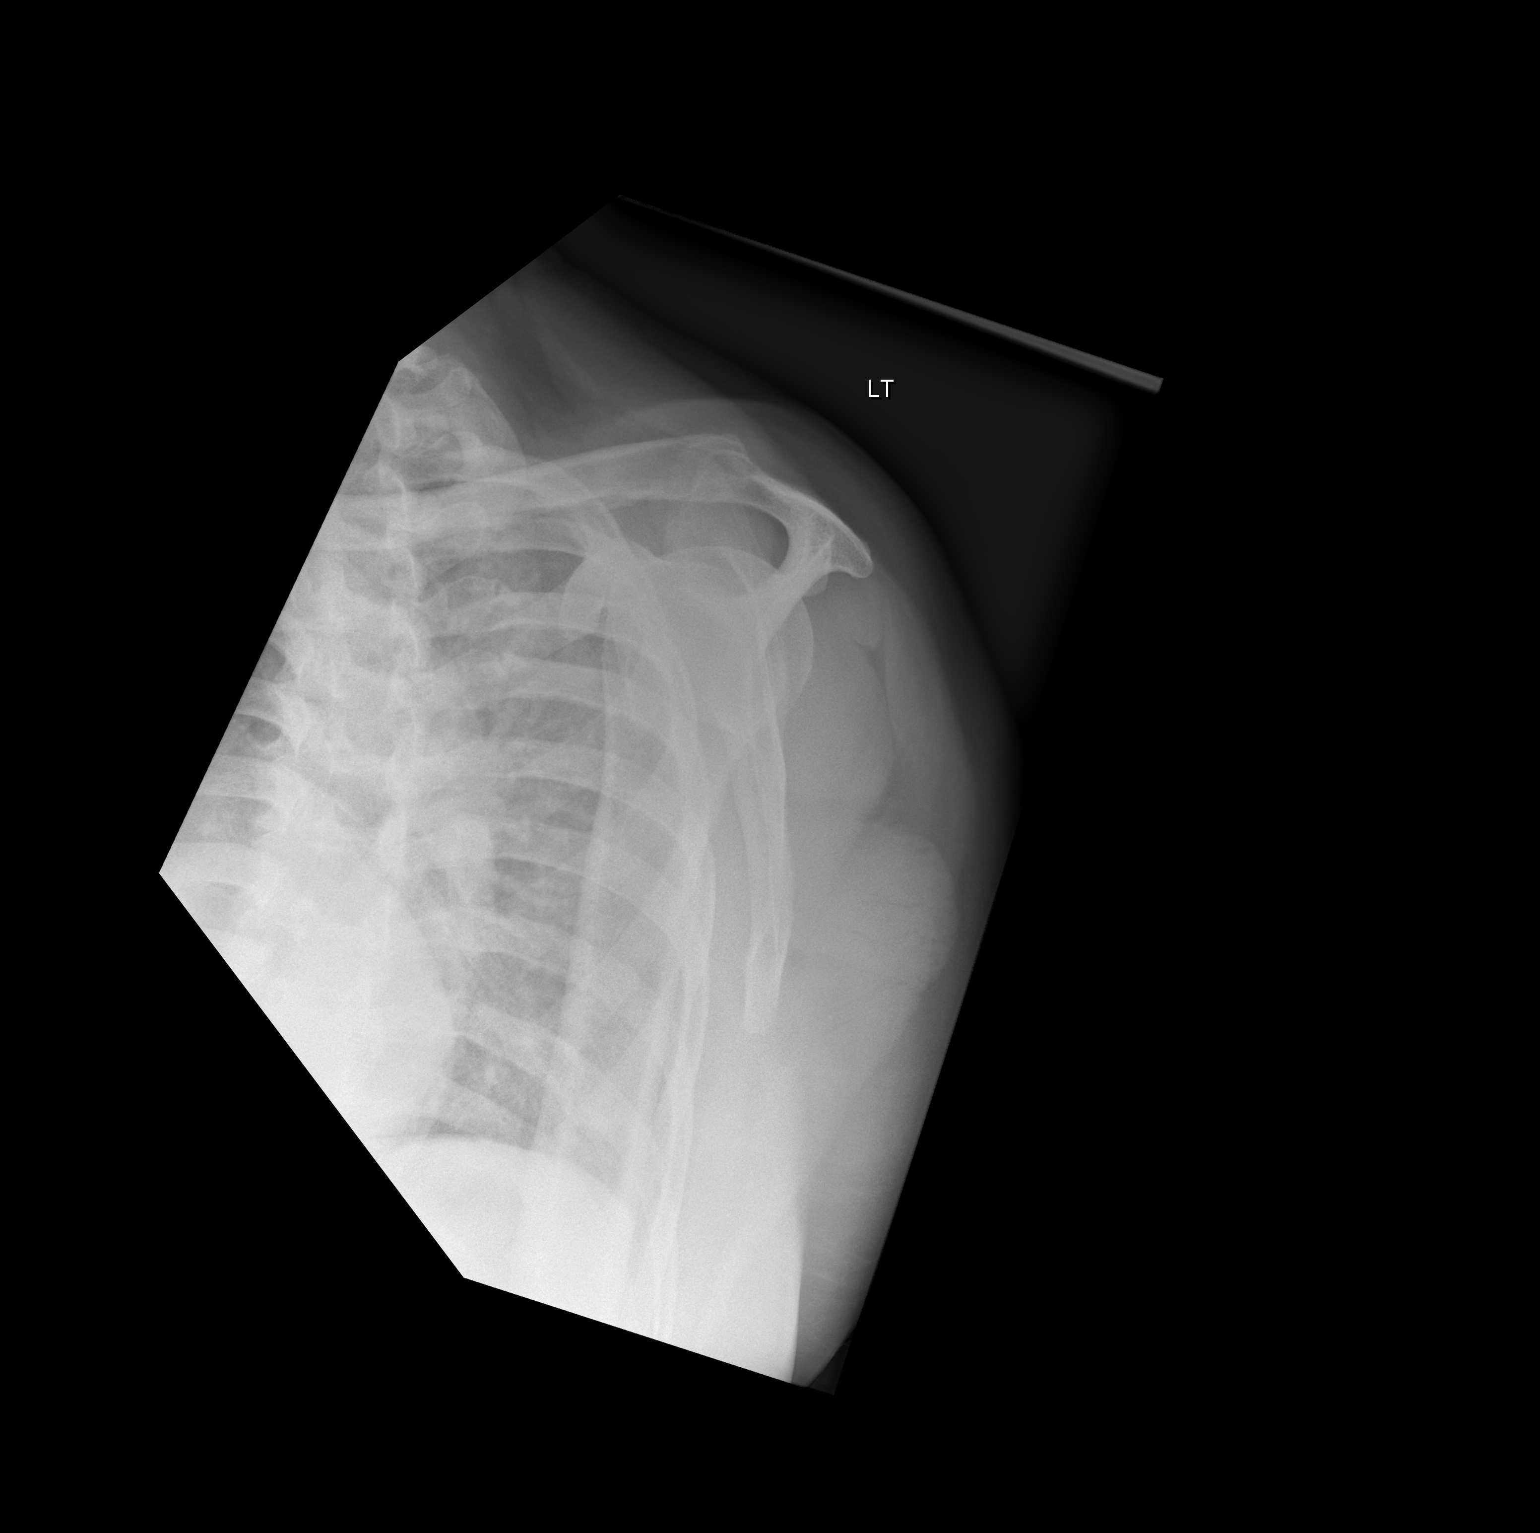

[2 of 2 positions shown; findings below may reference images not displayed]

FINDINGS: There is no evidence of fracture or dislocation. There is no
evidence of arthropathy or other focal bone abnormality. Soft
tissues are unremarkable.
IMPRESSION: No acute abnormality noted.

## 2017-01-27 MED ORDER — CYCLOBENZAPRINE HCL 10 MG PO TABS: 10.0000 mg | ORAL_TABLET | Freq: Two times a day (BID) | ORAL | 0 refills | Status: DC | PRN

## 2017-01-27 NOTE — Telephone Encounter (Signed)
Please determine how often she is taking this and what dose she is currently taking. Also confirm the reason she is on this medication. Then I will determine if increasing the dose is appropriate. Thanks.

## 2017-01-27 NOTE — Telephone Encounter (Signed)
Sent to pharmacy 

## 2017-01-27 NOTE — Telephone Encounter (Signed)
Please advise 

## 2017-01-27 NOTE — Telephone Encounter (Signed)
Left message to return call 

## 2017-01-27 NOTE — Telephone Encounter (Signed)
Patient has requested to have a higher dosage of cyclobenzaprine, pt stated that the current dosage is no longer helping. Pt contact [phone]

## 2017-01-27 NOTE — Telephone Encounter (Signed)
Patient states she is supposed to be on 1/2 tab bid but has been taking 1 tab bid which has helped

## 2017-01-28 MED ORDER — CYCLOBENZAPRINE HCL 10 MG PO TABS: 10.0000 mg | ORAL_TABLET | Freq: Two times a day (BID) | ORAL | 0 refills | Status: DC | PRN

## 2017-01-28 NOTE — Telephone Encounter (Signed)
Left message to notify rx was sent to pharmacy.

## 2017-01-30 ENCOUNTER — Encounter: Payer: Self-pay | Admitting: Family Medicine

## 2017-01-30 ENCOUNTER — Telehealth: Payer: Self-pay

## 2017-01-30 ENCOUNTER — Ambulatory Visit (INDEPENDENT_AMBULATORY_CARE_PROVIDER_SITE_OTHER): Payer: Medicare Other | Admitting: Family Medicine

## 2017-01-30 ENCOUNTER — Ambulatory Visit (INDEPENDENT_AMBULATORY_CARE_PROVIDER_SITE_OTHER): Payer: Medicare Other

## 2017-01-30 VITALS — BP 118/64 | HR 78 | Temp 98.4°F | Wt 222.2 lb

## 2017-01-30 DIAGNOSIS — M25552 Pain in left hip: Secondary | ICD-10-CM

## 2017-01-30 DIAGNOSIS — F251 Schizoaffective disorder, depressive type: Secondary | ICD-10-CM

## 2017-01-30 DIAGNOSIS — K635 Polyp of colon: Secondary | ICD-10-CM

## 2017-01-30 DIAGNOSIS — W19XXXA Unspecified fall, initial encounter: Secondary | ICD-10-CM | POA: Insufficient documentation

## 2017-01-30 DIAGNOSIS — M25522 Pain in left elbow: Secondary | ICD-10-CM

## 2017-01-30 DIAGNOSIS — I201 Angina pectoris with documented spasm: Secondary | ICD-10-CM | POA: Diagnosis not present

## 2017-01-30 DIAGNOSIS — M722 Plantar fascial fibromatosis: Secondary | ICD-10-CM | POA: Diagnosis not present

## 2017-01-30 DIAGNOSIS — E785 Hyperlipidemia, unspecified: Secondary | ICD-10-CM | POA: Diagnosis not present

## 2017-01-30 DIAGNOSIS — S59902A Unspecified injury of left elbow, initial encounter: Secondary | ICD-10-CM | POA: Diagnosis not present

## 2017-01-30 DIAGNOSIS — Z96642 Presence of left artificial hip joint: Secondary | ICD-10-CM | POA: Diagnosis not present

## 2017-01-30 DIAGNOSIS — S79912A Unspecified injury of left hip, initial encounter: Secondary | ICD-10-CM | POA: Diagnosis not present

## 2017-01-30 HISTORY — DX: Polyp of colon: K63.5

## 2017-01-30 NOTE — Assessment & Plan Note (Signed)
Patient with 2 falls recently. No other real specific symptoms with this. Neurologically intact. Gait appears normal. She is going to be doing physical therapy for her back and will see if they can work on gait and balance as well. Given continued discomfort in her left hip and left elbow with tenderness we will obtain x-rays of these areas as well to evaluate for fracture.

## 2017-01-30 NOTE — Assessment & Plan Note (Signed)
Fairly significantly elevated LDL previously. I discussed starting on an alternative statin with her. She was unsure regarding this. I did discuss the risk of stroke and heart attack with elevated cholesterol. She will contact us sometime in the next week to let us know what she decides regarding medication for this issue.

## 2017-01-30 NOTE — Assessment & Plan Note (Signed)
Encouraged icing and stretching. If not improving could consider referral for injection.

## 2017-01-30 NOTE — Progress Notes (Signed)
[name], MD Phone: [phone]  Valerie Vazquez is a 61 y.o. female who presents today for follow-up.  Patient is due for colonoscopy. Had one several months ago and had a polyp. She was still on Eliquis though they would not remove the polyp and needs this repeated. She notes no bleeding issues with the Eliquis. Has not had any DVTs or PEs since being on the Eliquis. In the past as she has come off of anticoagulation she has had issues with clotting.  Depression: She reports no depression currently. She's taking Lexapro and Zyprexa. She has not gotten in to see psychiatry yet. No SI or HI.  Patient reports she's fallen on 2 occasions. Once in the shower and then once down 3 steps out of her back door. When she fell in the shower her eyes were closed and she leaned and kept going. She caught herself on the wall. No injury. No head injury. No loss of consciousness. When she fell down the steps she landed on her left side particularly on her left knee, hip and elbow. No head injury. No loss of consciousness. She continues to have left elbow and left hip pain. Her knee has improved. She reports possibly being a little more clumsy over the last several weeks. No particular balance issues. No medication changes.  She reports left-sided plantar fasciitis. Pain with for several steps in the morning that improves throughout the day. She is not icing and is not doing any stretches.  Most recent lipid panel reviewed. LDL was quite elevated. She has been on simvastatin previously with severe muscle aches. She is hesitant to try anything else for this.  PMH: Former smoker   ROS see history of present illness  Objective  Physical Exam Vitals:   01/30/17 1007  BP: 118/64  Pulse: 78  Temp: 98.4 F (36.9 C)    BP Readings from Last 3 Encounters:  01/30/17 118/64  01/20/17 118/62  01/13/17 110/72   Wt Readings from Last 3 Encounters:  01/30/17 222 lb 3.2 oz (100.8 kg)  01/20/17 219  lb 12.8 oz (99.7 kg)  01/13/17 217 lb (98.4 kg)    Physical Exam  Constitutional: No distress.  Cardiovascular: Normal rate, regular rhythm and normal heart sounds.   Pulmonary/Chest: Effort normal and breath sounds normal.  Musculoskeletal: She exhibits no edema.  Left elbow with no bruising, swelling, or erythema, there is tenderness over the lateral aspect of the elbow, no bony defects palpated, left hip and buttock with no bruising, erythema, or swelling, there is mild tenderness posterior laterally on the hip, full range of motion left and right hip and left elbow with no pain, tenderness in the left plantar surface of her foot near the heel insertion point of the plantar fascia  Neurological: She is alert. Gait normal.  CN 2-12 intact, 5/5 strength in bilateral biceps, triceps, grip, quads, hamstrings, plantar and dorsiflexion, sensation to light touch intact in bilateral UE and LE  Skin: Skin is warm and dry. She is not diaphoretic.  Psychiatric: Mood and affect normal.     Assessment/Plan: Please see individual problem list.  Schizoaffective disorder, depressive type ([place]) Significantly improved. Has had some weight gain on the Zyprexa. No SI or HI. She'll continue her current medications. I did discuss psychiatry with her and discussed with our referral coordinator. The patient will stop and speak with our referral coordinator regarding who she needs to contact for psychiatry.  Falls Patient with 2 falls recently. No other real  specific symptoms with this. Neurologically intact. Gait appears normal. She is going to be doing physical therapy for her back and will see if they can work on gait and balance as well. Given continued discomfort in her left hip and left elbow with tenderness we will obtain x-rays of these areas as well to evaluate for fracture.  Plantar fasciitis of left foot Encouraged icing and stretching. If not improving could consider referral for injection.  Colon  polyp Patient is due for repeat colonoscopy to remove this. Currently on Eliquis. She's had fairly significant issues with coming off of anticoagulation in the past. We will send a message to the hematologist that she saw last year to get their advice on bridging versus holding her Eliquis for the procedure.  Hyperlipidemia Fairly significantly elevated LDL previously. I discussed starting on an alternative statin with her. She was unsure regarding this. I did discuss the risk of stroke and heart attack with elevated cholesterol. She will contact us sometime in the next week to let us know what she decides regarding medication for this issue.   Orders Placed This Encounter  Procedures  . DG Elbow Complete Left    Standing Status:   Future    Number of Occurrences:   1    Standing Expiration Date:   04/01/2018    Order Specific Question:   Reason for Exam (SYMPTOM  OR DIAGNOSIS REQUIRED)    Answer:   persistent left elbow pain status post fall, tenderness over lateral elbow    Order Specific Question:   Preferred imaging location?    Answer:   [facility] Specific Question:   Radiology Contrast Protocol - do NOT remove file path    Answer:   \\[email or web address]  . DG HIP UNILAT WITH PELVIS 2-3 VIEWS LEFT    Standing Status:   Future    Number of Occurrences:   1    Standing Expiration Date:   04/01/2018    Order Specific Question:   Reason for Exam (SYMPTOM  OR DIAGNOSIS REQUIRED)    Answer:   Left hip pain that has been persistent status post fall on the left hip, history of hip replacement    Order Specific Question:   Preferred imaging location?    Answer:   [facility] Specific Question:   Radiology Contrast Protocol - do NOT remove file path    Answer:   \\[email or web address]   [name], MD [place]

## 2017-01-30 NOTE — Assessment & Plan Note (Signed)
Significantly improved. Has had some weight gain on the Zyprexa. No SI or HI. She'll continue her current medications. I did discuss psychiatry with her and discussed with our referral coordinator. The patient will stop and speak with our referral coordinator regarding who she needs to contact for psychiatry.

## 2017-01-30 NOTE — Telephone Encounter (Signed)
Left message to return call 

## 2017-01-30 NOTE — Telephone Encounter (Signed)
-----   Message from [name], MD sent at 01/30/2017  2:30 PM EDT ----- Please let the patient know that her x-rays did not reveal any fracture. They did show degenerative changes. Showed that her hip replacement was well seated. They did recommend that if her elbow pain persists we could consider repeat imaging in 7-10 days. Thanks.

## 2017-01-30 NOTE — Patient Instructions (Signed)
Nice to see you. We are going to get you to see a psychiatrist. We will check x-rays and contact you with the results. We will contact you once we hear from the hematologist regarding your Eliquis.

## 2017-01-30 NOTE — Telephone Encounter (Signed)
Advised patient that Dr. Caryl Bis denied blood thinner request. In response Dr. Vicente Males requested barium swallow's. Patient states intolerance and nausea with barium swallow. Requests contact to Dr. Grayland Ormond for possible bridge medication that will allow for safe procedure due to pt's hx of PE's.  Faxed blood thinner request to Dr. Grayland Ormond.

## 2017-01-30 NOTE — Assessment & Plan Note (Signed)
Patient is due for repeat colonoscopy to remove this. Currently on Eliquis. She's had fairly significant issues with coming off of anticoagulation in the past. We will send a message to the hematologist that she saw last year to get their advice on bridging versus holding her Eliquis for the procedure.

## 2017-02-02 ENCOUNTER — Telehealth: Payer: Self-pay | Admitting: Family Medicine

## 2017-02-02 NOTE — Telephone Encounter (Signed)
Please let the patient know that I heard from Dr. Grayland Ormond regarding her Eliquis. He noted that she should be able to discontinue the Eliquis 24-48 hours prior to the colonoscopy and then reinitiate it the day after the colonoscopy. He did state she would still be at risk for DVT/PE though this would be minimal. Please find out if the patient is comfortable holding her Eliquis and if so I can send a message to Dr. Vicente Males informing him of the Eliquis instructions. If she is not comfortable holding it she likely will not be able to have her colonoscopy and polyp removal. Thanks.

## 2017-02-02 NOTE — Telephone Encounter (Signed)
-----   Message from Lloyd Huger, MD sent at 01/30/2017  4:36 PM EDT ----- She could probably be okay discontinuing treatment 24-48 hours prior to her colonoscopy and then reinitiating the day after. She would still be a risk for DVT/PE, but this would be minimal.   ----- Message ----- From: [name], MD Sent: 01/30/2017  11:00 AM To: Lloyd Huger, MD  Hi Dr Grayland Ormond,  I wanted to get you help regarding this patients anticoagulation. 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 She has been on eliquis since 2016 and has been stable on this. She has had issues with recurrent clots in the past when she comes off anticoagulation. She is due for a colonoscopy to remove a polyp and they have requested that she be off the eliquis for this procedure. Given her prior issues when stopping anticoagulation I wanted to get your input on this. Would it be best to stop several days in advance of the procedure and bridge with lovenox or would you recommend holding the eliquis the day prior to the procedure and resuming the day after the procedure? Any help is much appreciated. Thanks as always.   [name]

## 2017-02-02 NOTE — Telephone Encounter (Signed)
Left message to return call 

## 2017-02-02 NOTE — Telephone Encounter (Signed)
Patient could take gabapentin 600 mg in the morning and at night and then take 300 mg in the middle of the day. She should separate the doses by about 8 hours if possible.

## 2017-02-02 NOTE — Telephone Encounter (Signed)
Patient notified and would like to proceed with the colonoscopy, she is aware pf the risk

## 2017-02-02 NOTE — Telephone Encounter (Signed)
Patient notified and states the elbow pain has improved. Patient would like to know if you can increase the gabapentin because it sometimes feels like she has needle sticks

## 2017-02-02 NOTE — Telephone Encounter (Signed)
Noted. I will forward a message to Dr. Vicente Males who is to perform the patient's colonoscopy.

## 2017-02-03 NOTE — Telephone Encounter (Signed)
Patient notified and will need new rx

## 2017-02-04 ENCOUNTER — Ambulatory Visit: Payer: Medicare Other | Attending: Family Medicine | Admitting: Physical Therapy

## 2017-02-04 ENCOUNTER — Encounter: Payer: Self-pay | Admitting: Physical Therapy

## 2017-02-04 ENCOUNTER — Other Ambulatory Visit: Payer: Self-pay

## 2017-02-04 DIAGNOSIS — G8929 Other chronic pain: Secondary | ICD-10-CM | POA: Insufficient documentation

## 2017-02-04 DIAGNOSIS — R262 Difficulty in walking, not elsewhere classified: Secondary | ICD-10-CM | POA: Diagnosis not present

## 2017-02-04 DIAGNOSIS — M545 Low back pain: Secondary | ICD-10-CM | POA: Diagnosis not present

## 2017-02-04 DIAGNOSIS — M6281 Muscle weakness (generalized): Secondary | ICD-10-CM | POA: Insufficient documentation

## 2017-02-04 MED ORDER — OLANZAPINE 10 MG PO TABS
10.0000 mg | ORAL_TABLET | Freq: Every day | ORAL | 0 refills | Status: DC
Start: 2017-02-04 — End: 2017-05-05

## 2017-02-04 MED ORDER — GABAPENTIN 600 MG PO TABS: ORAL_TABLET | ORAL | 1 refills | Status: DC

## 2017-02-04 MED ORDER — ESCITALOPRAM OXALATE 5 MG PO TABS: ORAL_TABLET | ORAL | 1 refills | Status: DC

## 2017-02-04 MED ORDER — ISOSORBIDE MONONITRATE ER 30 MG PO TB24: 30.0000 mg | ORAL_TABLET | Freq: Every day | ORAL | 3 refills | Status: DC

## 2017-02-04 NOTE — Telephone Encounter (Signed)
Patient needs new Rx for change in dosing, thanks

## 2017-02-04 NOTE — Telephone Encounter (Signed)
Please advise 

## 2017-02-04 NOTE — Telephone Encounter (Signed)
Sent to pharmacy 

## 2017-02-04 NOTE — Telephone Encounter (Signed)
Last OV 01/30/17 last filled  zyprexa 01/10/17 30 1rf imdur 01/09/17 30 11rf lexapro 01/10/17 90 1rf  Patient requesting 90 day

## 2017-02-04 NOTE — Telephone Encounter (Signed)
90 day refills sent to pharmacy. Please see if she has contacted the psychiatrist's office yet.

## 2017-02-04 NOTE — Telephone Encounter (Signed)
Patient requested to have a update on this Rx  Pt contact [phone]

## 2017-02-04 NOTE — Addendum Note (Signed)
Addended by: [name] on: 02/04/2017 07:48 PM   Modules accepted: Orders

## 2017-02-05 ENCOUNTER — Telehealth: Payer: Self-pay | Admitting: Family Medicine

## 2017-02-05 DIAGNOSIS — R202 Paresthesia of skin: Secondary | ICD-10-CM

## 2017-02-05 NOTE — Telephone Encounter (Signed)
Pt called and stated that she fell last week from about 3 steps up and fell on her arm. Pt states that she is now starting to get sharp arm pain and shock waves (like when you hit your funny bone). Pt mentioned this at her PT appt yesterday and the therapist stated that it could be possible that she knocked the screws loose in her neck. Please advise, thank you!  Call pt @ [phone]

## 2017-02-05 NOTE — Telephone Encounter (Signed)
Spoke with patient she states she is having neck pain with radiation with down into left thumb.  Surgeon is in [place] . Needs referral to local surgeon.   Scheduled appointment to see you tomorrow .
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
## 2017-02-05 NOTE — Telephone Encounter (Signed)
Left message to return call 

## 2017-02-05 NOTE — Telephone Encounter (Signed)
Seen 01/20/17

## 2017-02-05 NOTE — Telephone Encounter (Signed)
Patient advised of below and verbalized understanding.  She prefers to wait and see Dr Caryl Bis tomorrow since pain has been going for a little over a week now.   Advised if pain worsen through the night to seek treatment.   Patient verbalized understanding.

## 2017-02-05 NOTE — Telephone Encounter (Signed)
Patient advised of below , she hasn't contacted psychiatrist office .  Advised her to contacted psychiatrist office and she verbalized understanding.

## 2017-02-05 NOTE — Telephone Encounter (Signed)
Referral placed. Agree with need for appointment. If she's having numbness or weakness in the arm or if her pain worsens needs to be evaluated today. Thanks.

## 2017-02-06 ENCOUNTER — Encounter: Payer: Self-pay | Admitting: Family Medicine

## 2017-02-06 ENCOUNTER — Ambulatory Visit (INDEPENDENT_AMBULATORY_CARE_PROVIDER_SITE_OTHER): Payer: Medicare Other

## 2017-02-06 ENCOUNTER — Telehealth: Payer: Self-pay | Admitting: Family Medicine

## 2017-02-06 ENCOUNTER — Ambulatory Visit (INDEPENDENT_AMBULATORY_CARE_PROVIDER_SITE_OTHER): Payer: Medicare Other | Admitting: Family Medicine

## 2017-02-06 VITALS — BP 142/90 | HR 77 | Temp 98.3°F | Wt 220.4 lb

## 2017-02-06 DIAGNOSIS — I201 Angina pectoris with documented spasm: Secondary | ICD-10-CM

## 2017-02-06 DIAGNOSIS — R202 Paresthesia of skin: Secondary | ICD-10-CM | POA: Insufficient documentation

## 2017-02-06 DIAGNOSIS — S199XXA Unspecified injury of neck, initial encounter: Secondary | ICD-10-CM | POA: Diagnosis not present

## 2017-02-06 MED ORDER — PREDNISONE 10 MG PO TABS: ORAL_TABLET | ORAL | 0 refills | Status: DC

## 2017-02-06 NOTE — Telephone Encounter (Signed)
Pt called office, she thought that she missed a call from office today. She is waiting for x-ray results.

## 2017-02-06 NOTE — Patient Instructions (Signed)
Nice to see you. We are going to check an x-ray. We will start you on a prednisone taper to help with the discomfort. If you develop persistent numbness, or you develop weakness, increased pain, fevers, or any new or changing symptoms please seek medical attention.

## 2017-02-06 NOTE — Progress Notes (Signed)
  [name], MD Phone: [phone]  Valerie Vazquez Tedrick is a 61 y.o. female who presents today for follow-up.  Patient seen about a week ago and discussed a fall at that time. She reports since then she's had some shooting pains down the radial aspect of her left arm. Notes it is sharp. Occasionally tingles. Occasionally a little bit of numbness in that area as well. Goes all the way down to her left thumb. No weakness. Some discomfort in her left trapezius. She had fallen on her left side though reported no neck injury. Does have a history of a cervical spine fusion. 2014.  ROS see history of present illness  Objective  Physical Exam Vitals:   02/06/17 1035 02/06/17 1047  BP: (!) 160/92 (!) 142/90  Pulse: 77   Temp: 98.3 F (36.8 C)     BP Readings from Last 3 Encounters:  02/06/17 (!) 142/90  01/30/17 118/64  01/20/17 118/62   Wt Readings from Last 3 Encounters:  02/06/17 220 lb 6.4 oz (100 kg)  01/30/17 222 lb 3.2 oz (100.8 kg)  01/20/17 219 lb 12.8 oz (99.7 kg)    Physical Exam  Constitutional: No distress.  Cardiovascular: Normal rate, regular rhythm and normal heart sounds.   Pulmonary/Chest: Effort normal and breath sounds normal.  Musculoskeletal:  No midline neck tenderness, no midline neck step-off, no muscular neck tenderness, there is mild tenderness in the left mid trapezius with no spasm  Neurological: She is alert. Gait normal.  5 out of 5 strength bilateral biceps, triceps, and grip, sensation to light touch intact in bilateral upper extremities  Skin: She is not diaphoretic.     Assessment/Plan: Please see individual problem list.  Paresthesia Paresthesia in the radial aspect of her left arm. Suspect nerve impingement. Did have a fall previously and thus we'll obtain a cervical spine x-ray. I referred her to neurosurgery for follow-up already. We'll treat with a short prednisone taper. She'll monitor and she is given return precautions.   Orders  Placed This Encounter  Procedures  . DG Cervical Spine Complete    Standing Status:   Future    Number of Occurrences:   1    Standing Expiration Date:   04/08/2018    Order Specific Question:   Reason for Exam (SYMPTOM  OR DIAGNOSIS REQUIRED)    Answer:   paresthesias down radial aspect left arm, left trapezius discomfort, fall 2 weeks ago with no neck injury    Order Specific Question:   Preferred imaging location?    Answer:   [facility] Specific Question:   Radiology Contrast Protocol - do NOT remove file path    Answer:   [email or web address]    Meds ordered this encounter  Medications  . predniSONE (DELTASONE) 10 MG tablet    Sig: Take 50 mg (5 tablets) by mouth today, then decrease by 1 tablet daily until gone    Dispense:  15 tablet    Refill:  0   [name], MD [place]

## 2017-02-06 NOTE — Assessment & Plan Note (Signed)
Paresthesia in the radial aspect of her left arm. Suspect nerve impingement. Did have a fall previously and thus we'll obtain a cervical spine x-ray. I referred her to neurosurgery for follow-up already. We'll treat with a short prednisone taper. She'll monitor and she is given return precautions.

## 2017-02-08 ENCOUNTER — Encounter: Payer: Self-pay | Admitting: Physical Therapy

## 2017-02-09 ENCOUNTER — Other Ambulatory Visit: Payer: Self-pay | Admitting: Family Medicine

## 2017-02-09 ENCOUNTER — Ambulatory Visit: Payer: Medicare Other | Admitting: Physical Therapy

## 2017-02-09 MED ORDER — ENOXAPARIN SODIUM 100 MG/ML ~~LOC~~ SOLN: 1.0000 mg/kg | Freq: Two times a day (BID) | SUBCUTANEOUS | 0 refills | Status: DC

## 2017-02-09 NOTE — Progress Notes (Signed)
Lovenox will be sent to pharmacy and directions as provided from Dr. Grayland Ormond through the GI office will be provided to the patient in writing. Directions as outlined below. These were addended to make it easier for the patient.  1. Stop Eliquis 3 days prior to procedure.  2. Restart Eliquis 1 day after procedure  3. Bridge with Lovenox injections 1mg /kg BID; start 48 hours prior to surgery. Hold pm dose the night prior to surgery. Restart Lovenox 1mg /kg BID night after surgery x 48 hours.

## 2017-02-10 ENCOUNTER — Telehealth: Payer: Self-pay

## 2017-02-10 ENCOUNTER — Ambulatory Visit: Admit: 2017-02-10 | Payer: Medicare Other | Admitting: Gastroenterology

## 2017-02-10 SURGERY — ESOPHAGOGASTRODUODENOSCOPY (EGD) WITH PROPOFOL
Anesthesia: General

## 2017-02-10 NOTE — Telephone Encounter (Signed)
Left message to return call 

## 2017-02-10 NOTE — Telephone Encounter (Signed)
Patient notified

## 2017-02-10 NOTE — Telephone Encounter (Signed)
-----   Message from [name], MD sent at 02/09/2017  6:00 PM EDT ----- Regarding: FW: Lovenox Bridge Please see letter forwarded with instructions for patient. Please see if patient has any questions regarding the instructions. Thanks. Randall Hiss. ----- Message ----- From: Leontine Locket, CMA Sent: 02/05/2017   1:02 PM To: [name], MD Subject: RE: Lovenox Bridge                             Tuesday, June 19th  Thanks ----- Message ----- From: [name], MD Sent: 02/05/2017  12:53 PM To: Leontine Locket, CMA Subject: RE: Lovenox Bridge                             When is her colonoscopy scheduled for? Thanks. Dr Caryl Bis. ----- Message ----- From: Leontine Locket, CMA Sent: 02/04/2017   1:48 PM To: [name], MD, [name], MD Subject: RE: Lovenox Bridge                             Thank you Dr. Caryl Bis,   Please prescribe the Lovenox and inform the patient.   Your help is greatly appreciated.   Panya  ----- Message ----- From: [name], MD Sent: 02/04/2017  12:49 PM To: Leontine Locket, CMA Subject: RE: Lovenox Bridge                             We can certainly arrange for her lovenox if needed, though when we do lovenox bridging we provide the patient with a prescription and they do the injections themselves at home as we are not in the office long enough during the day to provide 2 injections in the office. If you would like me to prescribe the lovenox I am willing to do this and inform the patient of the instructions as well. Please let me know.   [name]   ----- Message ----- From: Leontine Locket, CMA Sent: 02/03/2017   4:31 PM To: [name], MD, [name], MD Subject: Lovenox Bridge                                 Dr. Grayland Ormond has cleared Ms. Birdena Jubilee for EGD & colonoscopy with the following instructions:  1. Stop Eliquis 3 days prior to procedure. 2. Restart Eliquis 1 day after procedure 3. Bridge with Lovenox  injections 1mg /kg BID; start 48 hours prior to surgery. Hold pm dose the night prior to surgery. Restart Lovenox 1mg /kg BID night after surgery x 48 hours.   As her PCP can she receive Lovenox injections and management at your location?  Injections are not offered here at [place].  Thanks Panya

## 2017-02-11 ENCOUNTER — Ambulatory Visit: Payer: Medicare Other | Admitting: Physical Therapy

## 2017-02-11 ENCOUNTER — Encounter: Payer: Self-pay | Admitting: Physical Therapy

## 2017-02-11 DIAGNOSIS — G8929 Other chronic pain: Secondary | ICD-10-CM | POA: Diagnosis not present

## 2017-02-11 DIAGNOSIS — R262 Difficulty in walking, not elsewhere classified: Secondary | ICD-10-CM

## 2017-02-11 DIAGNOSIS — M545 Low back pain: Principal | ICD-10-CM

## 2017-02-11 DIAGNOSIS — M6281 Muscle weakness (generalized): Secondary | ICD-10-CM | POA: Diagnosis not present

## 2017-02-11 NOTE — Therapy (Signed)
[facility] [address] [place], [address] Phone: [phone]   Fax:  [phone]  Physical Therapy Treatment  Patient Details  Name: Valerie Vazquez MRN: [id number] Date of Birth: 12/07/1955 Referring Provider: [name] MD  Encounter Date: 02/11/2017      PT End of Session - 02/11/17 1402    Visit Number 2   Number of Visits 12   Date for PT Re-Evaluation 03/18/17   Authorization Type 2   Authorization Time Period 10 (G codes)   PT Start Time 1344   PT Stop Time 1425   PT Time Calculation (min) 41 min   Activity Tolerance Patient tolerated treatment well   Behavior During Therapy [facility] for tasks assessed/performed      Past Medical History:  Diagnosis Date  . Anemia   . Bilateral renal cysts   . Chronic back pain    "upper and lower" (09/19/2014)  . Depression    "major" (09/19/2014)  . DVT (deep venous thrombosis) ([place])   . Esophageal spasm   . Gallstones   . Gastritis   . Gastroenteritis   . GERD (gastroesophageal reflux disease)   . Headache    "couple /month lately" (09/19/2014)  . High blood pressure   . High cholesterol   . History of blood transfusion 1985   related to hysterectomy  . Hypertension   . IBS (irritable bowel syndrome)   . Migraine    "a few times/yr" (09/19/2014)  . Myocardial infarction ([place]) 10/2010 X 3   "while hospitalized"  . Osteoarthritis    "qwhere; mostly around my joints" (09/19/2014)  . Pneumonia 04/2014  . PTSD (post-traumatic stress disorder)   . Pulmonary embolism ([place]) 04/2001; 09/19/2014   "after gallbladder OR; "  . Schizoaffective disorder ([place])   . Sleep apnea    "mild" (09/19/2014)  . Snoring    a. sleep study 5/16: No OSA  . Spinal stenosis   . Spondylosis   . Type II diabetes mellitus ([place])    "dx'd in 2007; lost weight; no RX for ~ 4 yr now" (09/19/2014)    Past Surgical History:  Procedure Laterality Date  . ABDOMINAL  HYSTERECTOMY  1985  . ANTERIOR CERVICAL DECOMP/DISCECTOMY FUSION  10/2012  . APPENDECTOMY  1985  . BACK SURGERY    . BREAST CYST EXCISION Right   . BREAST EXCISIONAL BIOPSY Right YRS AGO   NEG  . CARDIAC CATHETERIZATION   2000's; 2009  . CHOLECYSTECTOMY  2002  . COLONOSCOPY WITH PROPOFOL N/A 12/12/2016   Procedure: COLONOSCOPY WITH PROPOFOL;  Surgeon: [name], MD;  Location[facility] ENDOSCOPY;  Service: Endoscopy;  Laterality: N/A;  . EXCISION/RELEASE BURSA HIP Right 12/1984  . HERNIA REPAIR  1985  . LEFT HEART CATHETERIZATION WITH CORONARY ANGIOGRAM N/A 12/09/2014   Procedure: LEFT HEART CATHETERIZATION WITH CORONARY ANGIOGRAM;  Surgeon: Burnell Blanks, MD;  Location: [facility] CATH LAB;  Service: Cardiovascular;  Laterality: N/A;  . TONSILLECTOMY AND ADENOIDECTOMY  ~ 1968  . TOTAL HIP ARTHROPLASTY Left 04-06-2014  . UMBILICAL HERNIA REPAIR  1985  . VENA CAVA FILTER PLACEMENT  03/2014    There were no vitals filed for this visit.      Subjective Assessment - 02/11/17 1359    Subjective Patient reports she has not been able to exercise since previous session. She is having back pain.    Pertinent History history of back pain since 2006. She has had multiple falls slipping  off chair, bed and with reaching.   Limitations Sitting;Lifting;Standing;Walking;House hold activities;Other (comment)  lying, bending   Patient Stated Goals would like to be able to do housework with less difficulty, walk further to be able to shop without difficulty   Currently in Pain? Yes   Pain Score 5    Pain Location Back   Pain Orientation Right;Left;Lower   Pain Descriptors / Indicators Aching   Pain Type Chronic pain   Pain Onset More than a month ago   Pain Frequency Constant     Objective: Gait: ambulating independently  Treatment: Therapeutic exercise: Patient performed exercises with VC, demonstration, guidance of therapist Goals: strength, balance, stabilization, independent with home exercise  program Sitting: Hip adduction with ball and glute sets x 15 Hip abduction with green resistive band x 15 Ball under foot x 25 reps each LE Hip flexion each LE x 15 Knee flexion with resistive band x 15 reps each LE standing side stepping along counter x 2 min.   Patient response to treatment: patient required minimal cuing and assistance to perform exercises with correct technique and alignment.           PT Education - 02/11/17 1402    Education provided Yes   Education Details HEP for stabilization withball, resistive band   Person(s) Educated Patient   Methods Explanation;Demonstration;Verbal cues   Comprehension Verbalized understanding;Returned demonstration;Verbal cues required             PT Long Term Goals - 02/04/17 1623      PT LONG TERM GOAL #1   Title Patient will demonstrate improved function with daily tasks and decreased back pain as indicated by MODI score of  40% or better By 03/18/2017   Baseline MODI 54%   Status New     PT LONG TERM GOAL #2   Title Patient will demonstrate improved ability perform household chores and community activities with improved abiltiyt to sit/stand and walk for >30 min. by 03/18/2017   Baseline unable to sit, stand or walk for >10 min without increased pain   Status New     PT LONG TERM GOAL #3   Title Patient will be independent with home program for posture awareness, pain control, progressive exercises by 03/18/2017 to allow patient to transition to self management once discharged from physical therapy   Baseline limited knowledge of exercises and appropriate progression without instruction, guidance and cuing   Status New               Plan - 02/11/17 1403    Clinical Impression Statement Patient demonstrates improved understadning of exercises with minimal cuing. She continues with weakness and limited knowledge of appropriate exercises and progression and will benefit from additional physical therapy  intervention to achieve goals.    Rehab Potential Fair   Clinical Impairments Affecting Rehab Potential (+)motivated(-)multiple comorbidities; surgeries; OA   PT Frequency 2x / week   PT Duration 6 weeks   PT Treatment/Interventions Electrical Stimulation;Moist Heat;Patient/family education;Neuromuscular re-education;Therapeutic exercise;Manual techniques   PT Next Visit Plan pain control, progressive therapeutic exercise   PT Home Exercise Plan stabilization exercises seated, LE's   Consulted and Agree with Plan of Care Patient      Patient will benefit from skilled therapeutic intervention in order to improve the following deficits and impairments:  Difficulty walking, Decreased range of motion, Increased muscle spasms, Decreased endurance, Decreased activity tolerance, Impaired perceived functional ability, Pain, Decreased strength  Visit Diagnosis: Chronic bilateral low back pain, with  sciatica presence unspecified  Difficulty in walking, not elsewhere classified  Muscle weakness (generalized)     Problem List Patient Active Problem List   Diagnosis Date Noted  . Paresthesia 02/06/2017  . Falls 01/30/2017  . Colon polyp 01/30/2017  . RUQ pain 01/12/2017  . Heart disease 12/09/2016  . Plantar fasciitis of left foot 11/07/2016  . Costochondritis 10/06/2016  . BRBPR (bright red blood per rectum) 10/06/2016  . CAD (coronary artery disease) 09/28/2016  . Diarrhea 09/16/2016  . Epigastric discomfort 09/16/2016  . Schizoaffective disorder, depressive type ([place]) 08/28/2016  . Suicide attempt ([place]) 08/27/2016  . Colitis 04/08/2016  . Absolute anemia 01/29/2016  . Chronic shoulder pain (Location of Primary Source of Pain) (Left) 01/29/2016  . Osteoarthritis of hip (Bilateral) 01/29/2016  . Lumbar central spinal stenosis (moderate at L4-5; mild at L2-3 and L3-4) 01/29/2016  . Hx of cervical spine surgery 01/29/2016  . Numbness 11/19/2015  . Breast pain 10/19/2015  . Chest  pain 10/14/2015  . SOB (shortness of breath) 07/31/2015  . Pleural effusion, bilateral 07/31/2015  . Esophageal spasm 06/05/2015  . History of DVT (deep vein thrombosis) 02/25/2015  . 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 Constipation 12/12/2014  . Coronary vasospasm ([place]) 12/09/2014  . History of pulmonary embolus (PE) 09/19/2014  . Anxiety and depression 04/13/2014  . IBS (irritable bowel syndrome) 04/11/2014  . GERD (gastroesophageal reflux disease) 04/11/2014  . Insomnia 04/11/2014  . Essential hypertension 10/28/2013  . Anemia 10/28/2013  . Hyperlipidemia 10/28/2013    Jomarie Longs PT 02/12/2017, 9:49 PM  [facility] [address] [place], [address] Phone: [phone]   Fax:  [phone]  Name: Valerie Vazquez MRN: [id number] Date of Birth: 03-04-1956

## 2017-02-14 ENCOUNTER — Other Ambulatory Visit: Payer: Self-pay | Admitting: Family Medicine

## 2017-02-17 ENCOUNTER — Encounter: Admission: RE | Payer: Self-pay | Source: Ambulatory Visit

## 2017-02-17 ENCOUNTER — Ambulatory Visit: Payer: Medicare Other | Admitting: Anesthesiology

## 2017-02-17 ENCOUNTER — Ambulatory Visit
Admission: RE | Admit: 2017-02-17 | Discharge: 2017-02-17 | Disposition: A | Discharge status: Home / Self Care | Payer: Medicare Other | Source: Ambulatory Visit | Attending: Gastroenterology | Admitting: Gastroenterology

## 2017-02-17 ENCOUNTER — Encounter
Admission: RE | Disposition: A | Discharge status: Home / Self Care | Payer: Self-pay | Source: Ambulatory Visit | Attending: Gastroenterology

## 2017-02-17 ENCOUNTER — Ambulatory Visit: Payer: Medicare Other | Admitting: Physical Therapy

## 2017-02-17 ENCOUNTER — Ambulatory Visit: Admission: RE | Admit: 2017-02-17 | Payer: Medicare Other | Source: Ambulatory Visit | Admitting: Gastroenterology

## 2017-02-17 DIAGNOSIS — F4310 Post-traumatic stress disorder, unspecified: Secondary | ICD-10-CM | POA: Insufficient documentation

## 2017-02-17 DIAGNOSIS — K5909 Other constipation: Secondary | ICD-10-CM | POA: Diagnosis not present

## 2017-02-17 DIAGNOSIS — Z9049 Acquired absence of other specified parts of digestive tract: Secondary | ICD-10-CM | POA: Diagnosis not present

## 2017-02-17 DIAGNOSIS — G4730 Sleep apnea, unspecified: Secondary | ICD-10-CM | POA: Diagnosis not present

## 2017-02-17 DIAGNOSIS — I10 Essential (primary) hypertension: Secondary | ICD-10-CM | POA: Insufficient documentation

## 2017-02-17 DIAGNOSIS — Z86718 Personal history of other venous thrombosis and embolism: Secondary | ICD-10-CM | POA: Diagnosis not present

## 2017-02-17 DIAGNOSIS — Z79899 Other long term (current) drug therapy: Secondary | ICD-10-CM | POA: Diagnosis not present

## 2017-02-17 DIAGNOSIS — F259 Schizoaffective disorder, unspecified: Secondary | ICD-10-CM | POA: Insufficient documentation

## 2017-02-17 DIAGNOSIS — Z7901 Long term (current) use of anticoagulants: Secondary | ICD-10-CM | POA: Insufficient documentation

## 2017-02-17 DIAGNOSIS — R1310 Dysphagia, unspecified: Secondary | ICD-10-CM | POA: Insufficient documentation

## 2017-02-17 DIAGNOSIS — Z86010 Personal history of colonic polyps: Secondary | ICD-10-CM | POA: Diagnosis not present

## 2017-02-17 DIAGNOSIS — I2510 Atherosclerotic heart disease of native coronary artery without angina pectoris: Secondary | ICD-10-CM | POA: Diagnosis not present

## 2017-02-17 DIAGNOSIS — E119 Type 2 diabetes mellitus without complications: Secondary | ICD-10-CM | POA: Insufficient documentation

## 2017-02-17 DIAGNOSIS — I252 Old myocardial infarction: Secondary | ICD-10-CM | POA: Diagnosis not present

## 2017-02-17 DIAGNOSIS — Z96642 Presence of left artificial hip joint: Secondary | ICD-10-CM | POA: Diagnosis not present

## 2017-02-17 DIAGNOSIS — Z048 Encounter for examination and observation for other specified reasons: Secondary | ICD-10-CM | POA: Insufficient documentation

## 2017-02-17 DIAGNOSIS — Z87891 Personal history of nicotine dependence: Secondary | ICD-10-CM | POA: Diagnosis not present

## 2017-02-17 DIAGNOSIS — D124 Benign neoplasm of descending colon: Secondary | ICD-10-CM | POA: Diagnosis not present

## 2017-02-17 DIAGNOSIS — M1990 Unspecified osteoarthritis, unspecified site: Secondary | ICD-10-CM | POA: Insufficient documentation

## 2017-02-17 DIAGNOSIS — K635 Polyp of colon: Secondary | ICD-10-CM | POA: Diagnosis not present

## 2017-02-17 DIAGNOSIS — K219 Gastro-esophageal reflux disease without esophagitis: Secondary | ICD-10-CM | POA: Insufficient documentation

## 2017-02-17 DIAGNOSIS — F329 Major depressive disorder, single episode, unspecified: Secondary | ICD-10-CM | POA: Diagnosis not present

## 2017-02-17 DIAGNOSIS — Z86711 Personal history of pulmonary embolism: Secondary | ICD-10-CM | POA: Insufficient documentation

## 2017-02-17 DIAGNOSIS — E7800 Pure hypercholesterolemia, unspecified: Secondary | ICD-10-CM | POA: Insufficient documentation

## 2017-02-17 HISTORY — PX: COLONOSCOPY WITH PROPOFOL: SHX5780

## 2017-02-17 HISTORY — PX: ESOPHAGOGASTRODUODENOSCOPY (EGD) WITH PROPOFOL: SHX5813

## 2017-02-17 LAB — GLUCOSE, CAPILLARY: Glucose-Capillary: 107 mg/dL — ABNORMAL HIGH (ref 65–99)

## 2017-02-17 SURGERY — COLONOSCOPY WITH PROPOFOL
Anesthesia: General

## 2017-02-17 SURGERY — ESOPHAGOGASTRODUODENOSCOPY (EGD) WITH PROPOFOL
Anesthesia: General

## 2017-02-17 MED ORDER — LIDOCAINE 2% (20 MG/ML) 5 ML SYRINGE
INTRAMUSCULAR | Status: DC | PRN
  Administered 2017-02-17: 100 mg via INTRAVENOUS

## 2017-02-17 MED ORDER — SODIUM CHLORIDE 0.9 % IV SOLN
INTRAVENOUS | Status: DC
  Administered 2017-02-17: 11:00:00 via INTRAVENOUS

## 2017-02-17 MED ORDER — PROPOFOL 10 MG/ML IV BOLUS
INTRAVENOUS | Status: DC | PRN
  Administered 2017-02-17: 100 mg via INTRAVENOUS

## 2017-02-17 MED ORDER — PROPOFOL 500 MG/50ML IV EMUL
INTRAVENOUS | Status: DC | PRN
  Administered 2017-02-17: 200 ug/kg/min via INTRAVENOUS

## 2017-02-17 MED ORDER — PROPOFOL 10 MG/ML IV BOLUS
INTRAVENOUS | Status: AC
  Filled 2017-02-17: qty 20

## 2017-02-17 MED ORDER — GLYCOPYRROLATE 0.2 MG/ML IJ SOLN
INTRAMUSCULAR | Status: DC | PRN
  Administered 2017-02-17: 0.2 mg via INTRAVENOUS

## 2017-02-17 NOTE — Transfer of Care (Signed)
Immediate Anesthesia Transfer of Care Note  Patient: Valerie Vazquez  Procedure(s) Performed: Procedure(s): COLONOSCOPY WITH PROPOFOL (N/A) ESOPHAGOGASTRODUODENOSCOPY (EGD) WITH PROPOFOL (N/A)  Patient Location: Endoscopy Unit  Anesthesia Type:General  Level of Consciousness: awake and alert   Airway & Oxygen Therapy: Patient connected to nasal cannula oxygen  Post-op Assessment: Post -op Vital signs reviewed and stable  Post vital signs: stable  Last Vitals:  Vitals:   02/17/17 1205 02/17/17 1208  BP:  126/76  Pulse: (P) 75 69  Resp: (P) 16 20  Temp: (P) 36.4 C 36.4 C    Last Pain:  Vitals:   02/17/17 1208  TempSrc: Tympanic         Complications: No apparent anesthesia complications

## 2017-02-17 NOTE — Op Note (Signed)
[facility] Gastroenterology Patient Name: Valerie Vazquez Procedure Date: 02/17/2017 11:18 AM MRN: [id number] Account #: [id number] Date of Birth: 1956/08/08 Admit Type: Outpatient Age: 61 Room: [facility] ENDO ROOM 1 Gender: Female Note Status: Finalized Procedure:            Upper GI endoscopy Indications:          Dysphagia, Dysphagia per patient has resolved at this                        time Providers:            [name] MD, MD Referring MD:         Angela Adam. Caryl Bis (Referring MD) Complications:        No immediate complications. Procedure:            Pre-Anesthesia Assessment:                       - Prior to the procedure, a History and Physical was                        performed, and patient medications, allergies and                        sensitivities were reviewed. The patient's tolerance of                        previous anesthesia was reviewed.                       - The risks and benefits of the procedure and the                        sedation options and risks were discussed with the                        patient. All questions were answered and informed                        consent was obtained.                       - ASA Grade Assessment: III - A patient with severe                        systemic disease.                       After obtaining informed consent, the endoscope was                        passed under direct vision. Throughout the procedure,                        the patient's blood pressure, pulse, and oxygen                        saturations were monitored continuously. The Endoscope                        was introduced through the mouth, and advanced to the  third part of duodenum. The upper GI endoscopy was                        accomplished with ease. The patient tolerated the                        procedure well. Findings:      The esophagus was normal.      The stomach was normal.  The examined duodenum was normal. Impression:           - Normal esophagus.                       - Normal stomach.                       - Normal examined duodenum.                       - No specimens collected. Recommendation:       - Perform a colonoscopy today.                       - colonoscopy for surveillance for colon polyps Procedure Code(s):    --- Professional ---                       [phone], Esophagogastroduodenoscopy, flexible, transoral;                        diagnostic, including collection of specimen(s) by                        brushing or washing, when performed (separate procedure) Diagnosis Code(s):    --- Professional ---                       R13.10, Dysphagia, unspecified CPT copyright 2016 American Medical Association. All rights reserved. The codes documented in this report are preliminary and upon coder review may  be revised to meet current compliance requirements. [name], MD [name] MD, MD 02/17/2017 11:28:50 AM This report has been signed electronically. Number of Addenda: 0 Note Initiated On: 02/17/2017 11:18 AM      [facility]

## 2017-02-17 NOTE — Op Note (Signed)
[facility] Gastroenterology Patient Name: Olena Willy Procedure Date: 02/17/2017 11:17 AM MRN: [id number] Account #: [id number] Date of Birth: Jul 22, 1956 Admit Type: Outpatient Age: 61 Room: [facility] ENDO ROOM 1 Gender: Female Note Status: Finalized Procedure:            Colonoscopy Indications:          High risk colon cancer surveillance: Personal history                        of colonic polyps Providers:            [name] MD, MD Referring MD:         Angela Adam. Caryl Bis (Referring MD) Medicines:            Monitored Anesthesia Care Complications:        No immediate complications. Procedure:            Pre-Anesthesia Assessment:                       - Prior to the procedure, a History and Physical was                        performed, and patient medications, allergies and                        sensitivities were reviewed. The patient's tolerance of                        previous anesthesia was reviewed.                       - The risks and benefits of the procedure and the                        sedation options and risks were discussed with the                        patient. All questions were answered and informed                        consent was obtained.                       - ASA Grade Assessment: III - A patient with severe                        systemic disease.                       After obtaining informed consent, the colonoscope was                        passed under direct vision. Throughout the procedure,                        the patient's blood pressure, pulse, and oxygen                        saturations were monitored continuously. The                        Colonoscope  was introduced through the anus and                        advanced to the the cecum, identified by the                        appendiceal orifice, IC valve and transillumination.                        The colonoscopy was somewhat difficult due to poor                  bowel prep and a tortuous colon. Successful completion                        of the procedure was aided by lavage. The patient                        tolerated the procedure well. Findings:      A 6 mm polyp was found in the descending colon. The polyp was sessile.       The polyp was removed with a cold snare. Resection and retrieval were       complete.      The exam was otherwise without abnormality on direct and retroflexion       views. Impression:           - One 6 mm polyp in the descending colon, removed with                        a cold snare. Resected and retrieved.                       - The examination was otherwise normal on direct and                        retroflexion views. Recommendation:       - Discharge patient to home (with escort).                       - Advance diet as tolerated.                       - Continue present medications.                       - Continue Eliquis (apixaban) at prior dose.                       - Await pathology results.                       - Repeat colonoscopy in 5 years for surveillance. Procedure Code(s):    --- Professional ---                       [id number], Colonoscopy, flexible; with removal of tumor(s),                        polyp(s), or other lesion(s) by snare technique Diagnosis Code(s):    --- Professional ---  Z86.010, Personal history of colonic polyps                       D12.4, Benign neoplasm of descending colon CPT copyright 2016 American Medical Association. All rights reserved. The codes documented in this report are preliminary and upon coder review may  be revised to meet current compliance requirements. [name], MD [name] MD, MD 02/17/2017 12:04:22 PM This report has been signed electronically. Number of Addenda: 0 Note Initiated On: 02/17/2017 11:17 AM Scope Withdrawal Time: 0 hours 22 minutes 23 seconds  Total Procedure Duration: 0 hours 30 minutes 15 seconds        [facility]

## 2017-02-17 NOTE — Anesthesia Preprocedure Evaluation (Signed)
Anesthesia Evaluation  Patient identified by MRN, date of birth, ID band Patient awake    Reviewed: Allergy & Precautions, H&P , NPO status , Patient's Chart, lab work & pertinent test results, reviewed documented beta blocker date and time   History of Anesthesia Complications Negative for: history of anesthetic complications  Airway Mallampati: I  TM Distance: >3 FB Neck ROM: full    Dental  (+) Missing, Poor Dentition, Dental Advidsory Given   Pulmonary neg shortness of breath, sleep apnea , pneumonia, resolved, neg COPD, neg recent URI, former smoker,           Cardiovascular Exercise Tolerance: Good hypertension, (-) angina+ CAD and + Past MI  (-) Cardiac Stents and (-) CABG (-) dysrhythmias (-) Valvular Problems/Murmurs     Neuro/Psych neg Seizures PSYCHIATRIC DISORDERS (PTSD, schizoaffective disorder, depression)  Neuromuscular disease (cervical spinal stenosis with upper arm symptoms)    GI/Hepatic Neg liver ROS, GERD  ,  Endo/Other  diabetes, Well Controlled, Type 2  Renal/GU Renal disease  negative genitourinary   Musculoskeletal   Abdominal   Peds  Hematology  (+) Blood dyscrasia, anemia ,   Anesthesia Other Findings Past Medical History: No date: Anemia No date: Bilateral renal cysts No date: Chronic back pain     Comment: "upper and lower" (09/19/2014) No date: Depression     Comment: "major" (09/19/2014) No date: DVT (deep venous thrombosis) ([facility]) No date: Esophageal spasm No date: Gallstones No date: Gastritis No date: Gastroenteritis No date: GERD (gastroesophageal reflux disease) No date: Headache     Comment: "couple /month lately" (09/19/2014) No date: High blood pressure No date: High cholesterol 1985: History of blood transfusion     Comment: related to hysterectomy No date: Hypertension No date: IBS (irritable bowel syndrome) No date: Migraine     Comment: "a few times/yr"  (09/19/2014) 10/2010 X 3: Myocardial infarction [facility])     Comment: "while hospitalized" No date: Osteoarthritis     Comment: "qwhere; mostly around my joints" (09/19/2014) 04/2014: Pneumonia No date: PTSD (post-traumatic stress disorder) 04/2001; 09/19/2014: Pulmonary embolism ([place])     Comment: "after gallbladder OR; " No date: Schizoaffective disorder ([place]) No date: Sleep apnea     Comment: "mild" (09/19/2014) No date: Snoring     Comment: a. sleep study 5/16: No OSA No date: Spinal stenosis No date: Spondylosis No date: Type II diabetes mellitus ([place])     Comment: "dx'd in 2007; lost weight; no RX for ~ 4 yr               now" (09/19/2014)   Reproductive/Obstetrics negative OB ROS                             Anesthesia Physical Anesthesia Plan  ASA: III  Anesthesia Plan: General   Post-op Pain Management:    Induction: Intravenous  PONV Risk Score and Plan: 3 and Propofol  Airway Management Planned: Natural Airway and Nasal Cannula  Additional Equipment:   Intra-op Plan:   Post-operative Plan:   Informed Consent: I have reviewed the patients History and Physical, chart, labs and discussed the procedure including the risks, benefits and alternatives for the proposed anesthesia with the patient or authorized representative who has indicated his/her understanding and acceptance.   Dental Advisory Given  Plan Discussed with: Anesthesiologist, CRNA and Surgeon  Anesthesia Plan Comments:         Anesthesia Quick Evaluation

## 2017-02-17 NOTE — Anesthesia Post-op Follow-up Note (Cosign Needed)
Anesthesia QCDR form completed.        

## 2017-02-17 NOTE — H&P (Signed)
[name] MD [address], [place], [place] [address] Phone: [phone] Fax : [phone]  Primary Care Physician:  [name], MD Primary Gastroenterologist:  Dr. [name]   Pre-Procedure History & Physical: HPI:  Valerie Vazquez is a 61 y.o. female is here for an endoscopy and colonoscopy.   Past Medical History:  Diagnosis Date  . Anemia   . Bilateral renal cysts   . Chronic back pain    "upper and lower" (09/19/2014)  . Depression    "major" (09/19/2014)  . DVT (deep venous thrombosis) ([place])   . Esophageal spasm   . Gallstones   . Gastritis   . Gastroenteritis   . GERD (gastroesophageal reflux disease)   . Headache    "couple /month lately" (09/19/2014)  . High blood pressure   . High cholesterol   . History of blood transfusion 1985   related to hysterectomy  . Hypertension   . IBS (irritable bowel syndrome)   . Migraine    "a few times/yr" (09/19/2014)  . Myocardial infarction ([place]) 10/2010 X 3   "while hospitalized"  . Osteoarthritis    "qwhere; mostly around my joints" (09/19/2014)  . Pneumonia 04/2014  . PTSD (post-traumatic stress disorder)   . Pulmonary embolism ([place]) 04/2001; 09/19/2014   "after gallbladder OR; "  . Schizoaffective disorder ([place])   . Sleep apnea    "mild" (09/19/2014)  . Snoring    a. sleep study 5/16: No OSA  . Spinal stenosis   . Spondylosis   . Type II diabetes mellitus ([place])    "dx'd in 2007; lost weight; no RX for ~ 4 yr now" (09/19/2014)    Past Surgical History:  Procedure Laterality Date  . ABDOMINAL HYSTERECTOMY  1985  . ANTERIOR CERVICAL DECOMP/DISCECTOMY FUSION  10/2012  . APPENDECTOMY  1985  . BACK SURGERY    . BREAST CYST EXCISION Right   . BREAST EXCISIONAL BIOPSY Right YRS AGO   NEG  . CARDIAC CATHETERIZATION   2000's; 2009  . CHOLECYSTECTOMY  2002  . COLONOSCOPY WITH PROPOFOL N/A 12/12/2016   Procedure: COLONOSCOPY WITH PROPOFOL;  Surgeon: [name], MD;  Location: [facility] ENDOSCOPY;  Service:  Endoscopy;  Laterality: N/A;  . EXCISION/RELEASE BURSA HIP Right 12/1984  . HERNIA REPAIR  1985  . LEFT HEART CATHETERIZATION WITH CORONARY ANGIOGRAM N/A 12/09/2014   Procedure: LEFT HEART CATHETERIZATION WITH CORONARY ANGIOGRAM;  Surgeon: Burnell Blanks, MD;  Location: [facility] CATH LAB;  Service: Cardiovascular;  Laterality: N/A;  . TONSILLECTOMY AND ADENOIDECTOMY  ~ 1968  . TOTAL HIP ARTHROPLASTY Left 04-06-2014  . UMBILICAL HERNIA REPAIR  1985  . VENA CAVA FILTER PLACEMENT  03/2014    Prior to Admission medications   Medication Sig Start Date End Date Taking? Authorizing Provider  carvedilol (COREG) 3.125 MG tablet Take 1 tablet (3.125 mg total) by mouth 2 (two) times daily with a meal. 01/09/17  Yes Burnell Blanks, MD  cyclobenzaprine (FLEXERIL) 10 MG tablet TAKE 1 TABLET BY MOUTH TWICE A DAY AS NEEDED FOR MUSCLE SPASMS 02/16/17  Yes [name], MD  dicyclomine (BENTYL) 20 MG tablet TAKE 1 TABLET BY MOUTH 3 TIMES A DAY AS NEEDED FOR SPASMS 11/26/16  Yes [name], MD  diltiazem (CARDIZEM SR) 60 MG 12 hr capsule Take 1 capsule (60 mg total) by mouth every 12 (twelve) hours. 01/09/17  Yes Burnell Blanks, MD  diphenhydrAMINE (BENADRYL) 25 MG tablet Take 1 tablet (25 mg total) by mouth every  6 (six) hours. 09/12/16  Yes Jola Schmidt, MD  escitalopram (LEXAPRO) 5 MG tablet TAKE 3 TABLET BY MOUTH ONCE A DAY 02/04/17  Yes [name], MD  furosemide (LASIX) 20 MG tablet Take 20 mg by mouth daily as needed.   Yes [provider]  gabapentin (NEURONTIN) 600 MG tablet Please take 600 mg in the morning, 300 mg in the middle of the day, and 600 mg at night by mouth 02/04/17  Yes [name], MD  isosorbide mononitrate (IMDUR) 30 MG 24 hr tablet Take 1 tablet (30 mg total) by mouth daily. 02/04/17  Yes [name], MD  OLANZapine (ZYPREXA) 10 MG tablet Take 1 tablet (10 mg total) by mouth at bedtime. 02/04/17  Yes [name], MD  ondansetron  (ZOFRAN) 4 MG tablet Take 1 tablet (4 mg total) by mouth every 8 (eight) hours as needed for nausea or vomiting. 01/10/17  Yes [name], MD  ondansetron (ZOFRAN) 4 MG tablet TAKE 1 TABLET (4 MG TOTAL) BY MOUTH EVERY 8 (EIGHT) HOURS AS NEEDED FOR NAUSEA OR VOMITING. 02/16/17  Yes [name], MD  pantoprazole (PROTONIX) 40 MG tablet Take 1 tablet (40 mg total) by mouth daily. 01/10/17  Yes [name], MD  potassium chloride (K-DUR) 10 MEQ tablet Take 10 mEq by mouth daily as needed.   Yes [provider]  triamcinolone cream (KENALOG) 0.1 % Apply 1 application topically 2 (two) times daily. 09/13/16  Yes Gloriann Loan, PA-C  albuterol (PROVENTIL HFA;VENTOLIN HFA) 108 (90 Base) MCG/ACT inhaler Inhale 2 puffs into the lungs every 6 (six) hours as needed for wheezing or shortness of breath.    [provider]  apixaban (ELIQUIS) 5 MG TABS tablet Take 1 tablet (5 mg total) by mouth 2 (two) times daily. 09/25/16   [name], MD  enoxaparin (LOVENOX) 100 MG/ML injection Inject 1 mL (100 mg total) into the skin every 12 (twelve) hours. See instructions provided in writing to patient. 02/09/17   [name], MD  polyethylene glycol [facility] / Floria Raveling) packet Take 17 g by mouth daily. Mix one tablespoon with 8oz of your favorite juice or water every day until you are having soft formed stools. Then start taking once daily if you didn't have a stool the day before. Patient not taking: Reported on 02/17/2017 09/30/16   Merlyn Lot, MD  predniSONE (DELTASONE) 10 MG tablet Take 50 mg (5 tablets) by mouth today, then decrease by 1 tablet daily until gone Patient not taking: Reported on 02/17/2017 02/06/17   [name], MD    Allergies as of 02/05/2017 - Review Complete 02/04/2017  Allergen Reaction Noted  . Abilify [aripiprazole] Other (See Comments) 09/20/2013  . Augmentin [amoxicillin-pot clavulanate] Diarrhea and Nausea And Vomiting 09/20/2013  .  Bactrim [sulfamethoxazole-trimethoprim] Diarrhea 09/20/2013  . Ceftin [cefuroxime axetil] Diarrhea 09/20/2013  . Coconut oil  10/19/2015  . Diclofenac Other (See Comments) 08/01/2014  . Dye fdc red [red dye]  01/22/2016  . Indocin [indomethacin] Other (See Comments) 09/20/2013  . Linaclotide Diarrhea 12/09/2014  . Lisinopril Diarrhea and Other (See Comments) 09/20/2013  . Morphine Other (See Comments) 12/09/2014  . Morphine and related Other (See Comments) 09/20/2013  . Simvastatin Other (See Comments) 08/01/2014  . Sulfa antibiotics Diarrhea 12/09/2014  . Sulfamethoxazole-trimethoprim Diarrhea 12/09/2014  . Wellbutrin [bupropion] Other (See Comments) 09/20/2013  . Quetiapine fumarate Palpitations 08/01/2014    Family History  Problem Relation Age of Onset  . Stroke  Mother        Deceased  . Lung cancer Father        Deceased  . Healthy Daughter   . Healthy Son        x 2  . Other Son        Suicide  . Heart attack Neg Hx     Social History   Social History  . Marital status: Divorced    Spouse name: N/A  . Number of children: N/A  . Years of education: N/A   Occupational History  . Not on file.   Social History Main Topics  . Smoking status: Former Smoker    Packs/day: 1.50    Years: 10.00    Types: Cigarettes    Quit date: 04/12/1979  . Smokeless tobacco: Never Used  . Alcohol use No  . Drug use: No  . Sexual activity: Not Currently   Other Topics Concern  . Not on file   Social History Narrative   Lives with fiancee in an apartment on the first floor.  Has 3 grown children.  One child passed away.  On disability 12-10-1994 - 2000, 2007 - current.     Education: some college.    Review of Systems: See HPI, otherwise negative ROS  Physical Exam: BP (!) 157/75   Pulse 75   Temp 97.2 F (36.2 C) (Tympanic)   Resp 16   Ht 5\' 6"  (1.676 m)   Wt 219 lb (99.3 kg)   SpO2 100%   BMI 35.35 kg/m  General:   Alert,  pleasant and cooperative in NAD Head:   Normocephalic and atraumatic. Neck:  Supple; no masses or thyromegaly. Lungs:  Clear throughout to auscultation.    Heart:  Regular rate and rhythm. Abdomen:  Soft, nontender and nondistended. Normal bowel sounds, without guarding, and without rebound.   Neurologic:  Alert and  oriented x4;  grossly normal neurologically.  Impression/Plan: HAILA DENA is here for an endoscopy and colonoscopy to be performed for dysphagia and for surveillance due to prior history of colon polyp  Risks, benefits, limitations, and alternatives regarding  endoscopy and colonoscopy have been reviewed with the patient.  Questions have been answered.  All parties agreeable.   [name], MD  02/17/2017, 11:03 AM

## 2017-02-17 NOTE — Anesthesia Postprocedure Evaluation (Signed)
Anesthesia Post Note  Patient: Valerie Vazquez  Procedure(s) Performed: Procedure(s) (LRB): COLONOSCOPY WITH PROPOFOL (N/A) ESOPHAGOGASTRODUODENOSCOPY (EGD) WITH PROPOFOL (N/A)  Patient location during evaluation: Endoscopy Anesthesia Type: General Level of consciousness: awake and alert Pain management: pain level controlled Vital Signs Assessment: post-procedure vital signs reviewed and stable Respiratory status: spontaneous breathing, nonlabored ventilation, respiratory function stable and patient connected to nasal cannula oxygen Cardiovascular status: blood pressure returned to baseline and stable Postop Assessment: no signs of nausea or vomiting Anesthetic complications: no     Last Vitals:  Vitals:   02/17/17 1220 02/17/17 1230  BP: 135/77 135/73  Pulse: 70 65  Resp: 16 16  Temp:      Last Pain:  Vitals:   02/17/17 1208  TempSrc: Tympanic                 Martha Clan

## 2017-02-18 ENCOUNTER — Encounter: Payer: Self-pay | Admitting: Gastroenterology

## 2017-02-18 LAB — SURGICAL PATHOLOGY

## 2017-02-19 ENCOUNTER — Ambulatory Visit: Payer: Medicare Other | Admitting: Physical Therapy

## 2017-02-19 ENCOUNTER — Encounter: Payer: Self-pay | Admitting: Physical Therapy

## 2017-02-19 DIAGNOSIS — R262 Difficulty in walking, not elsewhere classified: Secondary | ICD-10-CM

## 2017-02-19 DIAGNOSIS — M6281 Muscle weakness (generalized): Secondary | ICD-10-CM | POA: Diagnosis not present

## 2017-02-19 DIAGNOSIS — M545 Low back pain: Principal | ICD-10-CM

## 2017-02-19 DIAGNOSIS — G8929 Other chronic pain: Secondary | ICD-10-CM

## 2017-02-19 NOTE — Therapy (Signed)
[facility] [address] [place], [address] Phone: [phone]   Fax:  [phone]  Physical Therapy Treatment  Patient Details  Name: Valerie Vazquez MRN: [id number] Date of Birth: [birth year] Referring Provider: [name] MD  Encounter Date: 02/19/2017      PT End of Session - 02/19/17 1553    Visit Number 3   Number of Visits 12   Date for PT Re-Evaluation 03/18/17   Authorization Type 3   Authorization Time Period 10 (G codes)   PT Start Time 1546   PT Stop Time 1625   PT Time Calculation (min) 39 min   Activity Tolerance Patient tolerated treatment well   Behavior During Therapy [facility] for tasks assessed/performed      Past Medical History:  Diagnosis Date  . Anemia   . Bilateral renal cysts   . Chronic back pain    "upper and lower" (09/19/2014)  . Depression    "major" (09/19/2014)  . DVT (deep venous thrombosis) ([place])   . Esophageal spasm   . Gallstones   . Gastritis   . Gastroenteritis   . GERD (gastroesophageal reflux disease)   . Headache    "couple /month lately" (09/19/2014)  . High blood pressure   . High cholesterol   . History of blood transfusion 1985   related to hysterectomy  . Hypertension   . IBS (irritable bowel syndrome)   . Migraine    "a few times/yr" (09/19/2014)  . Myocardial infarction ([place]) 10/2010 X 3   "while hospitalized"  . Osteoarthritis    "qwhere; mostly around my joints" (09/19/2014)  . Pneumonia 04/2014  . PTSD (post-traumatic stress disorder)   . Pulmonary embolism ([place]) 04/2001; 09/19/2014   "after gallbladder OR; "  . Schizoaffective disorder ([place])   . Sleep apnea    "mild" (09/19/2014)  . Snoring    a. sleep study 5/16: No OSA  . Spinal stenosis   . Spondylosis   . Type II diabetes mellitus ([place])    "dx'd in 2007; lost weight; no RX for ~ 4 yr now" (09/19/2014)    Past Surgical History:  Procedure Laterality Date  . ABDOMINAL  HYSTERECTOMY  1985  . ANTERIOR CERVICAL DECOMP/DISCECTOMY FUSION  10/2012  . APPENDECTOMY  1985  . BACK SURGERY    . BREAST CYST EXCISION Right   . BREAST EXCISIONAL BIOPSY Right YRS AGO   NEG  . CARDIAC CATHETERIZATION   2000's; 2009  . CHOLECYSTECTOMY  2002  . COLONOSCOPY WITH PROPOFOL N/A 12/12/2016   Procedure: COLONOSCOPY WITH PROPOFOL;  Surgeon: [name], MD;  Location: [facility] ENDOSCOPY;  Service: Endoscopy;  Laterality: N/A;  . COLONOSCOPY WITH PROPOFOL N/A 02/17/2017   Procedure: COLONOSCOPY WITH PROPOFOL;  Surgeon: [name], MD;  Location: [facility] ENDOSCOPY;  Service: Gastroenterology;  Laterality: N/A;  . ESOPHAGOGASTRODUODENOSCOPY (EGD) WITH PROPOFOL N/A 02/17/2017   Procedure: ESOPHAGOGASTRODUODENOSCOPY (EGD) WITH PROPOFOL;  Surgeon: [name], MD;  Location: [facility] ENDOSCOPY;  Service: Gastroenterology;  Laterality: N/A;  . EXCISION/RELEASE BURSA HIP Right 12/1984  . HERNIA REPAIR  1985  . LEFT HEART CATHETERIZATION WITH CORONARY ANGIOGRAM N/A 12/09/2014   Procedure: LEFT HEART CATHETERIZATION WITH CORONARY ANGIOGRAM;  Surgeon: Burnell Blanks, MD;  Location: [facility] CATH LAB;  Service: Cardiovascular;  Laterality: N/A;  . TONSILLECTOMY AND ADENOIDECTOMY  ~ 1968  . TOTAL HIP ARTHROPLASTY Left 04-06-2014  . UMBILICAL HERNIA REPAIR  1985  . VENA CAVA FILTER PLACEMENT  03/2014  There were no vitals filed for this visit.      Subjective Assessment - 02/19/17 1549    Subjective Patient reports she is still having sensations in left arm and will be looking at getting neurosurgeon to look at her arm, neck.    Pertinent History history of back pain since 2006. She has had multiple falls slipping off chair, bed and with reaching.   Limitations Sitting;Lifting;Standing;Walking;House hold activities;Other (comment)  lying, bending   Patient Stated Goals would like to be able to do housework with less difficulty, walk further to be able to shop without difficulty   Currently in Pain?  Yes   Pain Score 2    Pain Location Back   Pain Orientation Right;Left;Lower   Pain Descriptors / Indicators Aching   Pain Type Chronic pain   Pain Onset More than a month ago   Pain Frequency Constant      Objective: Gait: ambulating independently Strength: decreased right LE hip flexion, abduction, knee extension and flexion as compared to left LE  Treatment: Therapeutic exercise: Patient performed exercises with VC, demonstration, guidance of therapist Goals: strength, balance, stabilization, independent with home exercise program Sitting: Hip adduction with ball and glute sets hold 3 seconds x 15 Hip abduction with green +red resistive band x 20 Hip flexion red resistive band each LE x 15 Knee extension 2# on ankles, 2 x 15 reps Knee flexion with green resistive band 2 x 15 reps each LE standing side stepping along counter x 2 min. On balance beam with green resistive band around thighs, UE support on counter Step ups onto balance stones leading x 10 reps each LE, UE support for balance Walk across (6) balance stones, staggered x 5 sets with UE support for balance   Patient response to treatment: Patient required minimal cuing and assistance of therapist to perform exercises with good technique and alignment. Mild fatigue noted at end of session. Improved motor control with repetition.         PT Education - 02/19/17 1552    Education provided Yes   Education Details HEP; re assessed stabilization exercises   Person(s) Educated Patient   Methods Explanation;Demonstration;Verbal cues   Comprehension Verbalized understanding;Returned demonstration;Verbal cues required             PT Long Term Goals - 02/04/17 1623      PT LONG TERM GOAL #1   Title Patient will demonstrate improved function with daily tasks and decreased back pain as indicated by MODI score of  40% or better By 03/18/2017   Baseline MODI 54%   Status New     PT LONG TERM GOAL #2   Title  Patient will demonstrate improved ability perform household chores and community activities with improved abiltiyt to sit/stand and walk for >30 min. by 03/18/2017   Baseline unable to sit, stand or walk for >10 min without increased pain   Status New     PT LONG TERM GOAL #3   Title Patient will be independent with home program for posture awareness, pain control, progressive exercises by 03/18/2017 to allow patient to transition to self management once discharged from physical therapy   Baseline limited knowledge of exercises and appropriate progression without instruction, guidance and cuing   Status New               Plan - 02/19/17 1553    Clinical Impression Statement Patient demonstrates improvement with less back pain and able to tolerate increased resistance with  exercises. She demonstrated improved motor control and balance with walking on balance stones with repetition. She will benefit from continued physical therapy intervention to achieve goals.   Rehab Potential Fair   Clinical Impairments Affecting Rehab Potential (+)motivated(-)multiple comorbidities; surgeries; OA   PT Frequency 2x / week   PT Duration 6 weeks   PT Treatment/Interventions Electrical Stimulation;Moist Heat;Patient/family education;Neuromuscular re-education;Therapeutic exercise;Manual techniques   PT Next Visit Plan pain control, progressive therapeutic exercise   PT Home Exercise Plan stabilization exercises seated, LE's      Patient will benefit from skilled therapeutic intervention in order to improve the following deficits and impairments:  Difficulty walking, Decreased range of motion, Increased muscle spasms, Decreased endurance, Decreased activity tolerance, Impaired perceived functional ability, Pain, Decreased strength  Visit Diagnosis: Chronic bilateral low back pain, with sciatica presence unspecified  Difficulty in walking, not elsewhere classified  Muscle weakness  (generalized)     Problem List Patient Active Problem List   Diagnosis Date Noted  . Paresthesia 02/06/2017  . Falls 01/30/2017  . Colon polyp 01/30/2017  . RUQ pain 01/12/2017  . Heart disease 12/09/2016  . Plantar fasciitis of left foot 11/07/2016  . Costochondritis 10/06/2016  . BRBPR (bright red blood per rectum) 10/06/2016  . CAD (coronary artery disease) 09/28/2016  . Diarrhea 09/16/2016  . Epigastric discomfort 09/16/2016  . Schizoaffective disorder, depressive type ([place]) 08/28/2016  . Suicide attempt ([place]) 08/27/2016  . Colitis 04/08/2016  . Absolute anemia 01/29/2016  . Chronic shoulder pain (Location of Primary Source of Pain) (Left) 01/29/2016  . Osteoarthritis of hip (Bilateral) 01/29/2016  . Lumbar central spinal stenosis (moderate at L4-5; mild at L2-3 and L3-4) 01/29/2016  . Hx of cervical spine surgery 01/29/2016  . Numbness 11/19/2015  . Breast pain 10/19/2015  . Chest pain 10/14/2015  . SOB (shortness of breath) 07/31/2015  . Pleural effusion, bilateral 07/31/2015  . Esophageal spasm 06/05/2015  . History of DVT (deep vein thrombosis) 02/25/2015  . 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 Constipation 12/12/2014  . Coronary vasospasm ([place]) 12/09/2014  . History of pulmonary embolus ([place]) 09/19/2014  . Anxiety and depression 04/13/2014  . IBS (irritable bowel syndrome) 04/11/2014  . GERD (gastroesophageal reflux disease) 04/11/2014  . Insomnia 04/11/2014  . Essential hypertension 10/28/2013  . Anemia 10/28/2013  . Hyperlipidemia 10/28/2013    Jomarie Longs PT 02/19/2017, 4:36 PM  [facility] [address], [place], [address] Phone: [phone]   Fax:  [phone]  Name: Valerie Vazquez MRN: [id number] Date of Birth: 01/24/1956

## 2017-02-22 ENCOUNTER — Emergency Department: Payer: Medicare Other

## 2017-02-22 DIAGNOSIS — I2510 Atherosclerotic heart disease of native coronary artery without angina pectoris: Secondary | ICD-10-CM | POA: Insufficient documentation

## 2017-02-22 DIAGNOSIS — R0789 Other chest pain: Secondary | ICD-10-CM | POA: Insufficient documentation

## 2017-02-22 DIAGNOSIS — Z87891 Personal history of nicotine dependence: Secondary | ICD-10-CM | POA: Diagnosis not present

## 2017-02-22 DIAGNOSIS — I10 Essential (primary) hypertension: Secondary | ICD-10-CM | POA: Diagnosis not present

## 2017-02-22 DIAGNOSIS — Z7901 Long term (current) use of anticoagulants: Secondary | ICD-10-CM | POA: Insufficient documentation

## 2017-02-22 DIAGNOSIS — R079 Chest pain, unspecified: Secondary | ICD-10-CM | POA: Diagnosis not present

## 2017-02-22 DIAGNOSIS — R509 Fever, unspecified: Secondary | ICD-10-CM | POA: Diagnosis not present

## 2017-02-22 LAB — BASIC METABOLIC PANEL
Anion gap: 7 (ref 5–15)
BUN: 6 mg/dL (ref 6–20)
CO2: 27 mmol/L (ref 22–32)
Calcium: 9.3 mg/dL (ref 8.9–10.3)
Chloride: 107 mmol/L (ref 101–111)
Creatinine, Ser: 0.84 mg/dL (ref 0.44–1.00)
GFR calc Af Amer: >60 mL/min (ref >60)
GFR calc non Af Amer: >60 mL/min (ref >60)
Glucose, Bld: 140 mg/dL — ABNORMAL HIGH (ref 65–99)
Potassium: 3.7 mmol/L (ref 3.5–5.1)
Sodium: 141 mmol/L (ref 135–145)

## 2017-02-22 LAB — CBC
HCT: 32.2 % — ABNORMAL LOW (ref 35.0–47.0)
Hemoglobin: 11.2 g/dL — ABNORMAL LOW (ref 12.0–16.0)
MCH: 30.8 pg (ref 26.0–34.0)
MCHC: 34.9 g/dL (ref 32.0–36.0)
MCV: 88.1 fL (ref 80.0–100.0)
Platelets: 341 10*3/uL (ref 150–440)
RBC: 3.65 MIL/uL — ABNORMAL LOW (ref 3.80–5.20)
RDW: 14.5 % (ref 11.5–14.5)
WBC: 8.5 10*3/uL (ref 3.6–11.0)

## 2017-02-22 LAB — TROPONIN I: Troponin I: <0.03 ng/mL (ref <0.03)

## 2017-02-22 NOTE — ED Triage Notes (Signed)
Patient reports past few days having off/on chest pain and wheezing.  Also reports having low grade fever at home (99).  Reports recent endoscopy and colonscopy and was off blood thinner or at least one day.

## 2017-02-23 ENCOUNTER — Emergency Department: Payer: Medicare Other

## 2017-02-23 ENCOUNTER — Encounter: Payer: Self-pay | Admitting: Radiology

## 2017-02-23 ENCOUNTER — Emergency Department
Admission: EM | Admit: 2017-02-23 | Discharge: 2017-02-23 | Disposition: A | Discharge status: Home / Self Care | Payer: Medicare Other | Attending: Emergency Medicine | Admitting: Emergency Medicine

## 2017-02-23 DIAGNOSIS — R079 Chest pain, unspecified: Secondary | ICD-10-CM | POA: Diagnosis not present

## 2017-02-23 LAB — BRAIN NATRIURETIC PEPTIDE: B Natriuretic Peptide: 13 pg/mL (ref 0.0–100.0)

## 2017-02-23 LAB — TROPONIN I: Troponin I: <0.03 ng/mL (ref <0.03)

## 2017-02-23 MED ORDER — IOPAMIDOL (ISOVUE-370) INJECTION 76%
75.0000 mL | Freq: Once | INTRAVENOUS | Status: AC | PRN
  Administered 2017-02-23: 75 mL via INTRAVENOUS

## 2017-02-23 NOTE — ED Notes (Signed)
Pt reporting CP X 2days with wheezing and SOB. Pt states CP is intermittent and denies CP at this time. Pt stating she had and EGD and colonoscopy last week and was transitioned from Lovenox to Eliquis and missed a dose. Pts lungs clear and o2 sats WNL on RA.

## 2017-02-23 NOTE — ED Provider Notes (Signed)
[facility] Emergency Department Provider Note  ____________________________________________  Time seen: Approximately 3:57 AM  I have reviewed the triage vital signs and the nursing notes.   HISTORY  Chief Complaint Chest Pain   HPI Valerie Vazquez is a 61 y.o. 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 Patient reports 5 days ago she underwent colonoscopy and endoscopy. She was bridged with lovenox while off of Eliquis for the procedure. Yesterday morning patient started having shortness of breath with minimal exertion which is mild and not present at rest, low-grade fever of 99.93F and also had intermittent wheezing. Patient also complained of intermittent dull central chest pain lasting a few minutes at a time. She reports that these symptoms are similar to a prior PE she has had in the past which prompted her visit to the emergency room. She has no chest pain at this time. Patient underwent a cardiac catheterization 2016 with no evidence of CAD. She had a stress test in January 2018 which was negative. She is a former smoker.No cough, congestion.  Past Medical History:  Diagnosis Date  . Anemia   . Bilateral renal cysts   . Chronic back pain    "upper and lower" (09/19/2014)  . Depression    "major" (09/19/2014)  . DVT (deep venous thrombosis) ([place])   . Esophageal spasm   . Gallstones   . Gastritis   . Gastroenteritis   . GERD (gastroesophageal reflux disease)   . Headache    "couple /month lately" (09/19/2014)  . High blood pressure   . High cholesterol   . History of blood transfusion 1985   related to hysterectomy  . Hypertension   . IBS (irritable bowel syndrome)   . Migraine    "a few times/yr" (09/19/2014)  . Myocardial infarction ([place]) 10/2010 X 3   "while hospitalized"  . Osteoarthritis    "qwhere; mostly around my  joints" (09/19/2014)  . Pneumonia 04/2014  . PTSD (post-traumatic stress disorder)   . Pulmonary embolism ([place]) 04/2001; 09/19/2014   "after gallbladder OR; "  . Schizoaffective disorder ([place])   . Sleep apnea    "mild" (09/19/2014)  . Snoring    a. sleep study 5/16: No OSA  . Spinal stenosis   . Spondylosis   . Type II diabetes mellitus ([place])    "dx'd in 2007; lost weight; no RX for ~ 4 yr now" (09/19/2014)    Patient Active Problem List   Diagnosis Date Noted  . Paresthesia 02/06/2017  . Falls 01/30/2017  . Colon polyp 01/30/2017  . RUQ pain 01/12/2017  . Heart disease 12/09/2016  . Plantar fasciitis of left foot 11/07/2016  . Costochondritis 10/06/2016  . BRBPR (bright red blood per rectum) 10/06/2016  . CAD (coronary artery disease) 09/28/2016  . Diarrhea 09/16/2016  . Epigastric discomfort 09/16/2016  . Schizoaffective disorder, depressive type ([place]) 08/28/2016  . Suicide attempt ([place]) 08/27/2016  . Colitis 04/08/2016  . Absolute anemia 01/29/2016  . Chronic shoulder pain (Location of Primary Source of Pain) (Left) 01/29/2016  . Osteoarthritis of hip (Bilateral) 01/29/2016  . Lumbar central spinal stenosis (moderate at L4-5; mild at L2-3 and L3-4) 01/29/2016  . Hx of cervical spine surgery 01/29/2016  . Numbness 11/19/2015  . Breast pain 10/19/2015  . Chest pain 10/14/2015  . SOB (shortness of breath) 07/31/2015  . Pleural effusion, bilateral 07/31/2015  . Esophageal spasm  06/05/2015  . History of DVT (deep vein thrombosis) 02/25/2015  . 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 Constipation 12/12/2014  . Coronary vasospasm ([place]) 12/09/2014  . History of pulmonary embolus (PE) 09/19/2014  . Anxiety and depression 04/13/2014  . IBS (irritable bowel syndrome) 04/11/2014  . GERD (gastroesophageal reflux disease) 04/11/2014  . Insomnia 04/11/2014  . Essential hypertension 10/28/2013  . Anemia 10/28/2013  . Hyperlipidemia 10/28/2013    Past Surgical History:  Procedure  Laterality Date  . ABDOMINAL HYSTERECTOMY  1985  . ANTERIOR CERVICAL DECOMP/DISCECTOMY FUSION  10/2012  . APPENDECTOMY  1985  . BACK SURGERY    . BREAST CYST EXCISION Right   . BREAST EXCISIONAL BIOPSY Right YRS AGO   NEG  . CARDIAC CATHETERIZATION   2000's; 2009  . CHOLECYSTECTOMY  2002  . COLONOSCOPY WITH PROPOFOL N/A 12/12/2016   Procedure: COLONOSCOPY WITH PROPOFOL;  Surgeon: [name], MD;  Location: [facility] ENDOSCOPY;  Service: Endoscopy;  Laterality: N/A;  . COLONOSCOPY WITH PROPOFOL N/A 02/17/2017   Procedure: COLONOSCOPY WITH PROPOFOL;  Surgeon: [name], MD;  Location[facility] ENDOSCOPY;  Service: Gastroenterology;  Laterality: N/A;  . ESOPHAGOGASTRODUODENOSCOPY (EGD) WITH PROPOFOL N/A 02/17/2017   Procedure: ESOPHAGOGASTRODUODENOSCOPY (EGD) WITH PROPOFOL;  Surgeon: [name], MD;  Location: [facility] ENDOSCOPY;  Service: Gastroenterology;  Laterality: N/A;  . EXCISION/RELEASE BURSA HIP Right 12/1984  . HERNIA REPAIR  1985  . LEFT HEART CATHETERIZATION WITH CORONARY ANGIOGRAM N/A 12/09/2014   Procedure: LEFT HEART CATHETERIZATION WITH CORONARY ANGIOGRAM;  Surgeon: Burnell Blanks, MD;  Location: [facility] CATH LAB;  Service: Cardiovascular;  Laterality: N/A;  . TONSILLECTOMY AND ADENOIDECTOMY  ~ 1968  . TOTAL HIP ARTHROPLASTY Left 04-06-2014  . UMBILICAL HERNIA REPAIR  1985  . VENA CAVA FILTER PLACEMENT  03/2014    Prior to Admission medications   Medication Sig Start Date End Date Taking? Authorizing Provider  albuterol (PROVENTIL HFA;VENTOLIN HFA) 108 (90 Base) MCG/ACT inhaler Inhale 2 puffs into the lungs every 6 (six) hours as needed for wheezing or shortness of breath.    [provider]  apixaban (ELIQUIS) 5 MG TABS tablet Take 1 tablet (5 mg total) by mouth 2 (two) times daily. 09/25/16   [name], MD  carvedilol (COREG) 3.125 MG tablet Take 1 tablet (3.125 mg total) by mouth 2 (two) times daily with a meal. 01/09/17   Burnell Blanks, MD  cyclobenzaprine  (FLEXERIL) 10 MG tablet TAKE 1 TABLET BY MOUTH TWICE A DAY AS NEEDED FOR MUSCLE SPASMS 02/16/17   [name], MD  dicyclomine (BENTYL) 20 MG tablet TAKE 1 TABLET BY MOUTH 3 TIMES A DAY AS NEEDED FOR SPASMS 11/26/16   [name], MD  diltiazem (CARDIZEM SR) 60 MG 12 hr capsule Take 1 capsule (60 mg total) by mouth every 12 (twelve) hours. 01/09/17   Burnell Blanks, MD  diphenhydrAMINE (BENADRYL) 25 MG tablet Take 1 tablet (25 mg total) by mouth every 6 (six) hours. 09/12/16   Jola Schmidt, MD  enoxaparin (LOVENOX) 100 MG/ML injection Inject 1 mL (100 mg total) into the skin every 12 (twelve) hours. See instructions provided in writing to patient. 02/09/17   [name], MD  escitalopram (LEXAPRO) 5 MG tablet TAKE 3 TABLET BY MOUTH ONCE A DAY 02/04/17   [name], MD  furosemide (LASIX) 20 MG tablet Take 20 mg by mouth daily as needed.    [provider]  gabapentin (NEURONTIN) 600 MG tablet Please take 600 mg in the morning, 300  mg in the middle of the day, and 600 mg at night by mouth 02/04/17   [name], MD  isosorbide mononitrate (IMDUR) 30 MG 24 hr tablet Take 1 tablet (30 mg total) by mouth daily. 02/04/17   [name], MD  OLANZapine (ZYPREXA) 10 MG tablet Take 1 tablet (10 mg total) by mouth at bedtime. 02/04/17   [name], MD  ondansetron (ZOFRAN) 4 MG tablet Take 1 tablet (4 mg total) by mouth every 8 (eight) hours as needed for nausea or vomiting. 01/10/17   [name], MD  ondansetron (ZOFRAN) 4 MG tablet TAKE 1 TABLET (4 MG TOTAL) BY MOUTH EVERY 8 (EIGHT) HOURS AS NEEDED FOR NAUSEA OR VOMITING. 02/16/17   [name], MD  pantoprazole (PROTONIX) 40 MG tablet Take 1 tablet (40 mg total) by mouth daily. 01/10/17   [name], MD  polyethylene glycol [facility] / Floria Raveling) packet Take 17 g by mouth daily. Mix one tablespoon with 8oz of your favorite juice or water every day until you are having soft formed  stools. Then start taking once daily if you didn't have a stool the day before. Patient not taking: Reported on 02/17/2017 09/30/16   Merlyn Lot, MD  potassium chloride (K-DUR) 10 MEQ tablet Take 10 mEq by mouth daily as needed.    [provider]  predniSONE (DELTASONE) 10 MG tablet Take 50 mg (5 tablets) by mouth today, then decrease by 1 tablet daily until gone Patient not taking: Reported on 02/17/2017 02/06/17   [name], MD  triamcinolone cream (KENALOG) 0.1 % Apply 1 application topically 2 (two) times daily. 09/13/16   Gloriann Loan, PA-C    Allergies Abilify [aripiprazole]; Augmentin [amoxicillin-pot clavulanate]; Bactrim [sulfamethoxazole-trimethoprim]; Ceftin [cefuroxime axetil]; Coconut oil; Diclofenac; Dye fdc red [red dye]; Indocin [indomethacin]; Linaclotide; Lisinopril; Morphine; Morphine and related; Simvastatin; Sulfa antibiotics; Sulfamethoxazole-trimethoprim; Wellbutrin [bupropion]; and Quetiapine fumarate  Family History  Problem Relation Age of Onset  . Stroke Mother        Deceased  . Lung cancer Father        Deceased  . Healthy Daughter   . Healthy Son        x 2  . Other Son        Suicide  . Heart attack Neg Hx     Social History Social History  Substance Use Topics  . Smoking status: Former Smoker    Packs/day: 1.50    Years: 10.00    Types: Cigarettes    Quit date: 04/12/1979  . Smokeless tobacco: Never Used  . Alcohol use No    Review of Systems  Constitutional: Negative for fever. Eyes: Negative for visual changes. ENT: Negative for sore throat. Neck: No neck pain  Cardiovascular: +chest pain. Respiratory: + shortness of breath, wheezing Gastrointestinal: Negative for abdominal pain, vomiting or diarrhea. Genitourinary: Negative for dysuria. Musculoskeletal: Negative for back pain. Skin: Negative for rash. Neurological: Negative for headaches, weakness or numbness. Psych: No SI or  HI  ____________________________________________   PHYSICAL EXAM:  VITAL SIGNS: ED Triage Vitals [02/22/17 2328]  Enc Vitals Group     BP (!) 160/87     Pulse Rate 77     Resp 18     Temp 98.3 F (36.8 C)     Temp Source Oral     SpO2 98 %     Weight      Height      Head Circumference  Peak Flow      Pain Score 5     Pain Loc      Pain Edu?      Excl. in [place]?     Constitutional: Alert and oriented. Well appearing and in no apparent distress. HEENT:      Head: Normocephalic and atraumatic.         Eyes: Conjunctivae are normal. Sclera is non-icteric.       Mouth/Throat: Mucous membranes are moist.       Neck: Supple with no signs of meningismus. Cardiovascular: Regular rate and rhythm. No murmurs, gallops, or rubs. 2+ symmetrical distal pulses are present in all extremities. No JVD. Respiratory: Normal respiratory effort. Lungs are clear to auscultation bilaterally. No wheezes, crackles, or rhonchi.  Gastrointestinal: Soft, non tender, and non distended with positive bowel sounds. No rebound or guarding. Musculoskeletal: trace edema on b/l LE Neurologic: Normal speech and language. Face is symmetric. Moving all extremities. No gross focal neurologic deficits are appreciated. Skin: Skin is warm, dry and intact. No rash noted. Psychiatric: Mood and affect are normal. Speech and behavior are normal.  ____________________________________________   LABS (all labs ordered are listed, but only abnormal results are displayed)  Labs Reviewed  BASIC METABOLIC PANEL - Abnormal; Notable for the following:       Result Value   Glucose, Bld 140 (*)    All other components within normal limits  CBC - Abnormal; Notable for the following:    RBC 3.65 (*)    Hemoglobin 11.2 (*)    HCT 32.2 (*)    All other components within normal limits  TROPONIN I  TROPONIN I  BRAIN NATRIURETIC PEPTIDE   ____________________________________________  EKG  ED ECG REPORT I, Rudene Re, the attending physician, personally viewed and interpreted this ECG.  Normal sinus rhythm, rate of 83, normal intervals, normal axis, T-wave inversions on inferior and lateral leads, no ST elevation. Unchanged from prior.  ____________________________________________  RADIOLOGY  CTA; No acute pulmonary embolism.  Small pleural effusions and dependent atelectasis. Heterogeneous lung attenuation associated with small airway disease. ____________________________________________   PROCEDURES  Procedure(s) performed: None Procedures Critical Care performed:  None ____________________________________________   INITIAL IMPRESSION / ASSESSMENT AND PLAN / ED COURSE  61 y.o. female with a history of PE/DVT on Eliquis and status post IVC filter, gastritis, hypertension, IBS, schizoaffective disorder who presents for evaluation of chest pain, SOB, wheezing, and low grade temp of 99.28F. patient was bridged with Lovenox and has an IVC filter however she said that she has had a PE in the past after having the IVC filter placed. She also reports that her symptoms today have been identical to prior PE she has had. Therefore because of these facts patient will undergo a CT angiogram to rule out pulmonary embolism. Her EKG is unchanged from prior. Her first troponin is negative. We'll check a second cardiac enzyme at this time.    _________________________ 7:50 AM on 02/23/2017 -----------------------------------------  Patient remains pain-free. Troponin 2 is negative. CTA with no acute findings. Patient was discharged home with close follow-up with primary care doctor.  Pertinent labs & imaging results that were available during my care of the patient were reviewed by me and considered in my medical decision making (see chart for details).    ____________________________________________   FINAL CLINICAL IMPRESSION(S) / ED DIAGNOSES  Final diagnoses:  Chest pain, unspecified type        NEW MEDICATIONS STARTED DURING THIS VISIT:  Discharge  Medication List as of 02/23/2017  6:19 AM       Note:  This document was prepared using Dragon voice recognition software and may include unintentional dictation errors.    Alfred Levins, [place], MD 02/23/17 [phone]

## 2017-02-24 ENCOUNTER — Encounter: Payer: Self-pay | Admitting: Physical Therapy

## 2017-02-24 ENCOUNTER — Telehealth: Payer: Self-pay | Admitting: Family Medicine

## 2017-02-24 ENCOUNTER — Ambulatory Visit: Payer: Medicare Other | Admitting: Physical Therapy

## 2017-02-24 DIAGNOSIS — M545 Low back pain: Principal | ICD-10-CM

## 2017-02-24 DIAGNOSIS — G8929 Other chronic pain: Secondary | ICD-10-CM | POA: Diagnosis not present

## 2017-02-24 DIAGNOSIS — R262 Difficulty in walking, not elsewhere classified: Secondary | ICD-10-CM

## 2017-02-24 DIAGNOSIS — M6281 Muscle weakness (generalized): Secondary | ICD-10-CM

## 2017-02-24 NOTE — Telephone Encounter (Signed)
Patient is scheduled   

## 2017-02-24 NOTE — Therapy (Signed)
[facility] [address], [place], [address] Phone: [phone]   Fax:  [phone]  Physical Therapy Treatment  Patient Details  Name: Valerie Vazquez MRN: [id number] Date of Birth: 04-27-1956 Referring Provider: [name] MD  Encounter Date: 02/24/2017      PT End of Session - 02/24/17 1330    Visit Number 4   Number of Visits 12   Date for PT Re-Evaluation 03/18/17   Authorization Type 4   Authorization Time Period 10 (G codes)   PT Start Time 1300   PT Stop Time 1332   PT Time Calculation (min) 32 min   Activity Tolerance Patient tolerated treatment well   Behavior During Therapy [facility] for tasks assessed/performed      Past Medical History:  Diagnosis Date  . Anemia   . Bilateral renal cysts   . Chronic back pain    "upper and lower" (09/19/2014)  . Depression    "major" (09/19/2014)  . DVT (deep venous thrombosis) ([place])   . Esophageal spasm   . Gallstones   . Gastritis   . Gastroenteritis   . GERD (gastroesophageal reflux disease)   . Headache    "couple /month lately" (09/19/2014)  . High blood pressure   . High cholesterol   . History of blood transfusion 1985   related to hysterectomy  . Hypertension   . IBS (irritable bowel syndrome)   . Migraine    "a few times/yr" (09/19/2014)  . Myocardial infarction ([place]) 10/2010 X 3   "while hospitalized"  . Osteoarthritis    "qwhere; mostly around my joints" (09/19/2014)  . Pneumonia 04/2014  . PTSD (post-traumatic stress disorder)   . Pulmonary embolism ([place]) 04/2001; 09/19/2014   "after gallbladder OR; "  . Schizoaffective disorder ([place])   . Sleep apnea    "mild" (09/19/2014)  . Snoring    a. sleep study 5/16: No OSA  . Spinal stenosis   . Spondylosis   . Type II diabetes mellitus ([place])    "dx'd in 2007; lost weight; no RX for ~ 4 yr now" (09/19/2014)    Past Surgical History:  Procedure Laterality Date  . ABDOMINAL  HYSTERECTOMY  1985  . ANTERIOR CERVICAL DECOMP/DISCECTOMY FUSION  10/2012  . APPENDECTOMY  1985  . BACK SURGERY    . BREAST CYST EXCISION Right   . BREAST EXCISIONAL BIOPSY Right YRS AGO   NEG  . CARDIAC CATHETERIZATION   2000's; 2009  . CHOLECYSTECTOMY  2002  . COLONOSCOPY WITH PROPOFOL N/A 12/12/2016   Procedure: COLONOSCOPY WITH PROPOFOL;  Surgeon: [name], MD;  Location: [facility] ENDOSCOPY;  Service: Endoscopy;  Laterality: N/A;  . COLONOSCOPY WITH PROPOFOL N/A 02/17/2017   Procedure: COLONOSCOPY WITH PROPOFOL;  Surgeon: [name], MD;  Location: [facility] ENDOSCOPY;  Service: Gastroenterology;  Laterality: N/A;  . ESOPHAGOGASTRODUODENOSCOPY (EGD) WITH PROPOFOL N/A 02/17/2017   Procedure: ESOPHAGOGASTRODUODENOSCOPY (EGD) WITH PROPOFOL;  Surgeon: [name], MD;  Location: [facility] ENDOSCOPY;  Service: Gastroenterology;  Laterality: N/A;  . EXCISION/RELEASE BURSA HIP Right 12/1984  . HERNIA REPAIR  1985  . LEFT HEART CATHETERIZATION WITH CORONARY ANGIOGRAM N/A 12/09/2014   Procedure: LEFT HEART CATHETERIZATION WITH CORONARY ANGIOGRAM;  Surgeon: Burnell Blanks, MD;  Location: [facility] CATH LAB;  Service: Cardiovascular;  Laterality: N/A;  . TONSILLECTOMY AND ADENOIDECTOMY  ~ 1968  . TOTAL HIP ARTHROPLASTY Left 04-06-2014  . UMBILICAL HERNIA REPAIR  1985  . VENA CAVA FILTER PLACEMENT  03/2014  There were no vitals filed for this visit.      Subjective Assessment - 02/24/17 1308    Subjective Patient reports she is still having intermittent symptoms into left UE at elbow. She is tired today due to being in hospital with friend who is having work up for high blood pressure.    Pertinent History history of back pain since 2006. She has had multiple falls slipping off chair, bed and with reaching.   Limitations Sitting;Lifting;Standing;Walking;House hold activities;Other (comment)  lying, bending   Patient Stated Goals would like to be able to do housework with less difficulty, walk further to be  able to shop without difficulty   Currently in Pain? No/denies       Objective: Gait: guarded posture, forward flexed at hips, slow cadence on arrival   Treatment: Therapeutic exercise: Patient performed exercises with VC, demonstration, guidance of therapist Goals: strength, balance, stabilization, independent with home exercise program Sitting: Hip adduction with ball and glute sets hold 2 seconds x 15 Hip abduction with green  resistive band x 15, then single leg x 15 reps each Knee extension 3# on ankles, 2 x 15 reps Knee flexion with green resistive band 2 x 15 reps each LE standing side stepping along counter x 2 min. On balance beam UE support on counter Step ups onto balancebeam leading x 10 reps each LE, UE support for balance Lateral step up with hip flexion, tap foot on balance beam and return to floor 2 x 5 reps each LE  Patient response to treatment: Patient with mild to moderate fatigue following exercises in standing. Patient required minimal VC and repeated demonstration to perform standing exercises, minimal for sitting exercises.  Patient with improved gait pattern with more erect posture, improved cadence at end of session            PT Education - 02/24/17 1310    Education provided Yes   Education Details HEP: stabilization exercises with correct technique and posture   Person(s) Educated Patient   Methods Explanation;Demonstration;Verbal cues   Comprehension Verbalized understanding;Returned demonstration;Verbal cues required             PT Long Term Goals - 02/04/17 1623      PT LONG TERM GOAL #1   Title Patient will demonstrate improved function with daily tasks and decreased back pain as indicated by MODI score of  40% or better By 03/18/2017   Baseline MODI 54%   Status New     PT LONG TERM GOAL #2   Title Patient will demonstrate improved ability perform household chores and community activities with improved abiltiyt to sit/stand and  walk for >30 min. by 03/18/2017   Baseline unable to sit, stand or walk for >10 min without increased pain   Status New     PT LONG TERM GOAL #3   Title Patient will be independent with home program for posture awareness, pain control, progressive exercises by 03/18/2017 to allow patient to transition to self management once discharged from physical therapy   Baseline limited knowledge of exercises and appropriate progression without instruction, guidance and cuing   Status New               Plan - 02/24/17 1332    Clinical Impression Statement Patient with fatigue today and tolerated treatment well with improved gait pattern and posture at end of session. Patient demonstrates steady progress towards goals with improvement noted in strength and improved gait pattern.  Patient will benefit  from continued physical therapy intervention to address limitations and achieve goals.    Rehab Potential Fair   Clinical Impairments Affecting Rehab Potential (+)motivated(-)multiple comorbidities; surgeries; OA   PT Frequency 2x / week   PT Duration 6 weeks   PT Treatment/Interventions Electrical Stimulation;Moist Heat;Patient/family education;Neuromuscular re-education;Therapeutic exercise;Manual techniques   PT Next Visit Plan pain control, progressive therapeutic exercise   PT Home Exercise Plan stabilization exercises seated, LE's      Patient will benefit from skilled therapeutic intervention in order to improve the following deficits and impairments:  Difficulty walking, Decreased range of motion, Increased muscle spasms, Decreased endurance, Decreased activity tolerance, Impaired perceived functional ability, Pain, Decreased strength  Visit Diagnosis: Chronic bilateral low back pain, with sciatica presence unspecified  Difficulty in walking, not elsewhere classified  Muscle weakness (generalized)     Problem List Patient Active Problem List   Diagnosis Date Noted  . Paresthesia  02/06/2017  . Falls 01/30/2017  . Colon polyp 01/30/2017  . RUQ pain 01/12/2017  . Heart disease 12/09/2016  . Plantar fasciitis of left foot 11/07/2016  . Costochondritis 10/06/2016  . BRBPR (bright red blood per rectum) 10/06/2016  . CAD (coronary artery disease) 09/28/2016  . Diarrhea 09/16/2016  . Epigastric discomfort 09/16/2016  . Schizoaffective disorder, depressive type ([place]) 08/28/2016  . Suicide attempt ([place]) 08/27/2016  . Colitis 04/08/2016  . Absolute anemia 01/29/2016  . Chronic shoulder pain (Location of Primary Source of Pain) (Left) 01/29/2016  . Osteoarthritis of hip (Bilateral) 01/29/2016  . Lumbar central spinal stenosis (moderate at L4-5; mild at L2-3 and L3-4) 01/29/2016  . Hx of cervical spine surgery 01/29/2016  . Numbness 11/19/2015  . Breast pain 10/19/2015  . Chest pain 10/14/2015  . SOB (shortness of breath) 07/31/2015  . Pleural effusion, bilateral 07/31/2015  . Esophageal spasm 06/05/2015  . History of DVT (deep vein thrombosis) 02/25/2015  . 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 Constipation 12/12/2014  . Coronary vasospasm ([place]) 12/09/2014  . History of pulmonary embolus (PE) 09/19/2014  . Anxiety and depression 04/13/2014  . IBS (irritable bowel syndrome) 04/11/2014  . GERD (gastroesophageal reflux disease) 04/11/2014  . Insomnia 04/11/2014  . Essential hypertension 10/28/2013  . Anemia 10/28/2013  . Hyperlipidemia 10/28/2013    Jomarie Longs PT 02/24/2017, 6:20 PM  [facility] [address] [place], [address] Phone: [phone]   Fax:  [phone]  Name: Valerie Vazquez MRN: [id number] Date of Birth: 09-08-1955

## 2017-02-24 NOTE — Telephone Encounter (Signed)
Pt called needing to make an ED follow up. Pt was in the ED for chest pain and low grade fever. Please advise, thank you!  Call pt @ [phone]

## 2017-02-25 ENCOUNTER — Encounter: Payer: Self-pay | Admitting: Family Medicine

## 2017-02-25 ENCOUNTER — Ambulatory Visit (INDEPENDENT_AMBULATORY_CARE_PROVIDER_SITE_OTHER): Payer: Medicare Other | Admitting: Family Medicine

## 2017-02-25 VITALS — BP 130/70 | HR 82 | Temp 98.7°F | Wt 220.2 lb

## 2017-02-25 DIAGNOSIS — R062 Wheezing: Secondary | ICD-10-CM | POA: Diagnosis not present

## 2017-02-25 DIAGNOSIS — T148XXA Other injury of unspecified body region, initial encounter: Secondary | ICD-10-CM | POA: Insufficient documentation

## 2017-02-25 DIAGNOSIS — R202 Paresthesia of skin: Secondary | ICD-10-CM

## 2017-02-25 DIAGNOSIS — R7309 Other abnormal glucose: Secondary | ICD-10-CM

## 2017-02-25 DIAGNOSIS — D649 Anemia, unspecified: Secondary | ICD-10-CM

## 2017-02-25 DIAGNOSIS — I201 Angina pectoris with documented spasm: Secondary | ICD-10-CM

## 2017-02-25 HISTORY — DX: Wheezing: R06.2

## 2017-02-25 LAB — HEMOGLOBIN A1C: Hgb A1c MFr Bld: 6.9 % — ABNORMAL HIGH (ref 4.6–6.5)

## 2017-02-25 NOTE — Assessment & Plan Note (Signed)
We'll see if we can get her referred to neurosurgery in [place].

## 2017-02-25 NOTE — Assessment & Plan Note (Signed)
Check A1c today.

## 2017-02-25 NOTE — Assessment & Plan Note (Signed)
Area of bruising where prior IV was. Mild hematoma. Encouraged heat. Encouraged to monitor.

## 2017-02-25 NOTE — Progress Notes (Signed)
  [name], MD Phone: [phone]  Valerie Vazquez Jawad is a 61 y.o. female who presents today for follow-up.  Patient presents for ED follow-up. She was evaluated in the emergency room for chest pain and shortness of breath that was accompanied by low-grade fever and wheezing. She was concerned she had a PE and they did a CT angiogram which did not reveal a PE. She does have an IVC filter in place and was also on Eliquis. She had bridged on and off of Eliquis with Lovenox prior to colonoscopy and EGD. She notes the fever has gone away. She's not had any recurrent chest pain or shortness of breath. No coughing. Has still been wheezing some. Has not been using her inhaler. She was found to be slightly anemic though at her baseline. Glucose is also elevated.  She does note a history of Raynaud's. Notes her bilateral fingers will become numb and tingly and turn blue when it is cold outside. They return back to normal once they warm up.  She notes when she is in the emergency room she had an IV that blew in her right forearm. There is a bruise there. She had some swelling a little bit proximally to this though that has gone away.  She also reports she would like to be referred to neurosurgeon in [place] for the paresthesias we discussed previously. She's not able to go to [place] [facility].  PMH: Former smoker   ROS see history of present illness  Objective  Physical Exam Vitals:   02/25/17 1115  BP: 130/70  Pulse: 82  Temp: 98.7 F (37.1 C)    BP Readings from Last 3 Encounters:  02/25/17 130/70  02/23/17 114/73  02/17/17 135/73   Wt Readings from Last 3 Encounters:  02/25/17 220 lb 3.2 oz (99.9 kg)  02/17/17 219 lb (99.3 kg)  02/06/17 220 lb 6.4 oz (100 kg)    Physical Exam  Constitutional: No distress.  Cardiovascular: Normal rate, regular rhythm and normal heart sounds.   Pulmonary/Chest: Effort normal. No respiratory distress. She has no wheezes. She has no rales.    Minimal scattered coarseness to her breath sounds  Musculoskeletal: She exhibits no edema.  Right proximal medial volar forearm with bruise and mild hematoma underlying this, no tenderness, no warmth, no erythema, no swelling elsewhere in her right arm, bilateral hands are warm and well-perfused  Neurological: She is alert. Gait normal.  Skin: She is not diaphoretic.     Assessment/Plan: Please see individual problem list.  Paresthesia We'll see if we can get her referred to neurosurgery in [place].  Anemia Persistent mild anemia. Could be chronic disease related. We will check iron studies today.  Elevated glucose Check A1c today.  Bruise Area of bruising where prior IV was. Mild hematoma. Encouraged heat. Encouraged to monitor.  Wheezing Suspect symptoms patient had leading to ED visit were possibly related to bronchitis. Minimal coarseness to her breath sounds today. No wheezing. Her symptoms overall have improved. Encouraged to try her inhaler to see if that would be of benefit. If not continuing to improve she will let us know.   Orders Placed This Encounter  Procedures  . Iron, TIBC and Ferritin Panel  . HgB A1c   [name], MD [place]

## 2017-02-25 NOTE — Patient Instructions (Addendum)
Nice to see you. I'm glad you're feeling a little bit better from her ED visit. Try using her inhaler to see if that will help with some of the wheezing. If it is not improving please let us know. Please try to keep your hands as warm as possible when it is cold outside. Please monitor the bruising in your right forearm. You can try heat on this to see if this will be beneficial.

## 2017-02-25 NOTE — Assessment & Plan Note (Signed)
Persistent mild anemia. Could be chronic disease related. We will check iron studies today.

## 2017-02-25 NOTE — Assessment & Plan Note (Signed)
Suspect symptoms patient had leading to ED visit were possibly related to bronchitis. Minimal coarseness to her breath sounds today. No wheezing. Her symptoms overall have improved. Encouraged to try her inhaler to see if that would be of benefit. If not continuing to improve she will let us know.

## 2017-02-26 ENCOUNTER — Ambulatory Visit: Payer: Medicare Other | Admitting: Physical Therapy

## 2017-02-26 ENCOUNTER — Encounter: Payer: Self-pay | Admitting: Physical Therapy

## 2017-02-26 ENCOUNTER — Telehealth: Payer: Self-pay | Admitting: *Deleted

## 2017-02-26 DIAGNOSIS — G8929 Other chronic pain: Secondary | ICD-10-CM

## 2017-02-26 DIAGNOSIS — M545 Low back pain: Secondary | ICD-10-CM | POA: Diagnosis not present

## 2017-02-26 DIAGNOSIS — M6281 Muscle weakness (generalized): Secondary | ICD-10-CM

## 2017-02-26 DIAGNOSIS — R262 Difficulty in walking, not elsewhere classified: Secondary | ICD-10-CM | POA: Diagnosis not present

## 2017-02-26 LAB — IRON,TIBC AND FERRITIN PANEL
%SAT: 23 % (ref 11–50)
Ferritin: 40 ng/mL (ref 20–288)
Iron: 72 ug/dL (ref 45–160)
TIBC: 307 ug/dL (ref 250–450)

## 2017-02-26 NOTE — Therapy (Signed)
[facility] [address], [place], [address] Phone: [phone]   Fax:  [phone]  Physical Therapy Treatment  Patient Details  Name: Valerie Vazquez MRN: [id number] Date of Birth: 09-16-1955 Referring Provider: [name] MD  Encounter Date: 02/26/2017      PT End of Session - 02/26/17 1422    Visit Number 5   Number of Visits 12   Date for PT Re-Evaluation 03/18/17   Authorization Type 5   Authorization Time Period 10 (G codes)   PT Start Time 4174   PT Stop Time 1445   PT Time Calculation (min) 28 min   Activity Tolerance Patient tolerated treatment well   Behavior During Therapy [facility] for tasks assessed/performed      Past Medical History:  Diagnosis Date  . Anemia   . Bilateral renal cysts   . Chronic back pain    "upper and lower" (09/19/2014)  . Depression    "major" (09/19/2014)  . DVT (deep venous thrombosis) ([place])   . Esophageal spasm   . Gallstones   . Gastritis   . Gastroenteritis   . GERD (gastroesophageal reflux disease)   . Headache    "couple /month lately" (09/19/2014)  . High blood pressure   . High cholesterol   . History of blood transfusion 1985   related to hysterectomy  . Hypertension   . IBS (irritable bowel syndrome)   . Migraine    "a few times/yr" (09/19/2014)  . Myocardial infarction ([place]) 10/2010 X 3   "while hospitalized"  . Osteoarthritis    "qwhere; mostly around my joints" (09/19/2014)  . Pneumonia 04/2014  . PTSD (post-traumatic stress disorder)   . Pulmonary embolism ([place]) 04/2001; 09/19/2014   "after gallbladder OR; "  . Schizoaffective disorder ([place])   . Sleep apnea    "mild" (09/19/2014)  . Snoring    a. sleep study 5/16: No OSA  . Spinal stenosis   . Spondylosis   . Type II diabetes mellitus ([place])    "dx'd in 2007; lost weight; no RX for ~ 4 yr now" (09/19/2014)    Past Surgical History:  Procedure Laterality Date  . ABDOMINAL  HYSTERECTOMY  1985  . ANTERIOR CERVICAL DECOMP/DISCECTOMY FUSION  10/2012  . APPENDECTOMY  1985  . BACK SURGERY    . BREAST CYST EXCISION Right   . BREAST EXCISIONAL BIOPSY Right YRS AGO   NEG  . CARDIAC CATHETERIZATION   2000's; 2009  . CHOLECYSTECTOMY  2002  . COLONOSCOPY WITH PROPOFOL N/A 12/12/2016   Procedure: COLONOSCOPY WITH PROPOFOL;  Surgeon: [name], MD;  Location: [facility] ENDOSCOPY;  Service: Endoscopy;  Laterality: N/A;  . COLONOSCOPY WITH PROPOFOL N/A 02/17/2017   Procedure: COLONOSCOPY WITH PROPOFOL;  Surgeon: [name], MD;  Location: [facility] ENDOSCOPY;  Service: Gastroenterology;  Laterality: N/A;  . ESOPHAGOGASTRODUODENOSCOPY (EGD) WITH PROPOFOL N/A 02/17/2017   Procedure: ESOPHAGOGASTRODUODENOSCOPY (EGD) WITH PROPOFOL;  Surgeon: [name], MD;  Location: [facility] ENDOSCOPY;  Service: Gastroenterology;  Laterality: N/A;  . EXCISION/RELEASE BURSA HIP Right 12/1984  . HERNIA REPAIR  1985  . LEFT HEART CATHETERIZATION WITH CORONARY ANGIOGRAM N/A 12/09/2014   Procedure: LEFT HEART CATHETERIZATION WITH CORONARY ANGIOGRAM;  Surgeon: Burnell Blanks, MD;  Location: [facility] CATH LAB;  Service: Cardiovascular;  Laterality: N/A;  . TONSILLECTOMY AND ADENOIDECTOMY  ~ 1968  . TOTAL HIP ARTHROPLASTY Left 04-06-2014  . UMBILICAL HERNIA REPAIR  1985  . VENA CAVA FILTER PLACEMENT  03/2014  There were no vitals filed for this visit.      Subjective Assessment - 02/26/17 1419    Subjective Patient reports having increased catching in both sides of lower back intermittently.    Pertinent History history of back pain since 2006. She has had multiple falls slipping off chair, bed and with reaching.   Limitations Sitting;Lifting;Standing;Walking;House hold activities;Other (comment)  lying, bending   Patient Stated Goals would like to be able to do housework with less difficulty, walk further to be able to shop without difficulty   Currently in Pain? No/denies            Objective: Gait: guarded posture, forward flexed at hips, slow cadence on arrival   Treatment: Therapeutic exercise: Patient performed exercises with VC, demonstration, guidance of therapist Goals: strength, balance, stabilization, independent with home exercise program Sitting: Hip adduction with ball and glute sets hold 2 seconds x 15 Hip abduction with green  resistive band x 15, then single leg x 15 reps each Knee extension 3# on ankles, 2 x 15 reps Knee flexion with green resistive band 2x 15 reps each LE Rhythmic stabilization in sitting for trunk flexion/extension and lateral flexion stabilization with manual resistance given x 10 reps Step ups onto balance stones leading 10x with each LE Staggered stones walk across x 5 sets Staggered stone weight shift x 10 with each foot in back position  Patient response to treatment: Patient with mild fatigue following standing exercises. Patient required moderate VC and repetition to complete exercises with good technqiue and alignment. Patient with improved gait pattern with more erect posture, improved cadence at end of session                        PT Education - 02/26/17 1420    Education provided Yes   Education Details HEP; stabilization exercises with corrent technique and posture   Person(s) Educated Patient   Methods Explanation;Demonstration;Verbal cues   Comprehension Verbalized understanding;Returned demonstration;Verbal cues required             PT Long Term Goals - 02/04/17 1623      PT LONG TERM GOAL #1   Title Patient will demonstrate improved function with daily tasks and decreased back pain as indicated by MODI score of  40% or better By 03/18/2017   Baseline MODI 54%   Status New     PT LONG TERM GOAL #2   Title Patient will demonstrate improved ability perform household chores and community activities with improved abiltiyt to sit/stand and walk for >30 min. by 03/18/2017   Baseline  unable to sit, stand or walk for >10 min without increased pain   Status New     PT LONG TERM GOAL #3   Title Patient will be independent with home program for posture awareness, pain control, progressive exercises by 03/18/2017 to allow patient to transition to self management once discharged from physical therapy   Baseline limited knowledge of exercises and appropriate progression without instruction, guidance and cuing   Status New               Plan - 02/26/17 1422    Clinical Impression Statement Patient with limited ability to exercise due to reports of increased pain and fatigued for lack of restful sleep. Patient demonstrates steady progress towards goals with improvement noted strength, endurance. Patient will benefit from continued physical therapy intervention to address limitations and achieve goals.     Rehab Potential Fair  Clinical Impairments Affecting Rehab Potential (+)motivated(-)multiple comorbidities; surgeries; OA   PT Frequency 2x / week   PT Duration 6 weeks   PT Treatment/Interventions Electrical Stimulation;Moist Heat;Patient/family education;Neuromuscular re-education;Therapeutic exercise;Manual techniques   PT Next Visit Plan pain control, progressive therapeutic exercise   PT Home Exercise Plan stabilization exercises seated, LE's      Patient will benefit from skilled therapeutic intervention in order to improve the following deficits and impairments:  Difficulty walking, Decreased range of motion, Increased muscle spasms, Decreased endurance, Decreased activity tolerance, Impaired perceived functional ability, Pain, Decreased strength  Visit Diagnosis: Chronic bilateral low back pain, with sciatica presence unspecified  Difficulty in walking, not elsewhere classified  Muscle weakness (generalized)     Problem List Patient Active Problem List   Diagnosis Date Noted  . Elevated glucose 02/25/2017  . Bruise 02/25/2017  . Wheezing 02/25/2017   . Paresthesia 02/06/2017  . Falls 01/30/2017  . Colon polyp 01/30/2017  . RUQ pain 01/12/2017  . Heart disease 12/09/2016  . Plantar fasciitis of left foot 11/07/2016  . Costochondritis 10/06/2016  . BRBPR (bright red blood per rectum) 10/06/2016  . CAD (coronary artery disease) 09/28/2016  . Diarrhea 09/16/2016  . Epigastric discomfort 09/16/2016  . Schizoaffective disorder, depressive type ([place]) 08/28/2016  . Suicide attempt ([place]) 08/27/2016  . Colitis 04/08/2016  . Absolute anemia 01/29/2016  . Chronic shoulder pain (Location of Primary Source of Pain) (Left) 01/29/2016  . Osteoarthritis of hip (Bilateral) 01/29/2016  . Lumbar central spinal stenosis (moderate at L4-5; mild at L2-3 and L3-4) 01/29/2016  . Hx of cervical spine surgery 01/29/2016  . Numbness 11/19/2015  . Breast pain 10/19/2015  . Chest pain 10/14/2015  . SOB (shortness of breath) 07/31/2015  . Pleural effusion, bilateral 07/31/2015  . Esophageal spasm 06/05/2015  . History of DVT (deep vein thrombosis) 02/25/2015  . 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 Constipation 12/12/2014  . Coronary vasospasm ([place]) 12/09/2014  . History of pulmonary embolus (PE) 09/19/2014  . Anxiety and depression 04/13/2014  . IBS (irritable bowel syndrome) 04/11/2014  . GERD (gastroesophageal reflux disease) 04/11/2014  . Insomnia 04/11/2014  . Essential hypertension 10/28/2013  . Anemia 10/28/2013  . Hyperlipidemia 10/28/2013    Jomarie Longs PT 02/27/2017, 10:22 AM  [facility] [address] [place], [address] Phone: [phone]   Fax:  [phone]  Name: YOLTZIN RANSOM MRN: [id number] Date of Birth: 1956-02-05

## 2017-02-26 NOTE — Telephone Encounter (Signed)
See result note.  

## 2017-02-26 NOTE — Telephone Encounter (Signed)
Patient requested lab Pt Contact: [phone]

## 2017-02-27 ENCOUNTER — Other Ambulatory Visit: Payer: Self-pay | Admitting: Family Medicine

## 2017-02-27 MED ORDER — FERROUS SULFATE 300 (60 FE) MG/5ML PO SYRP: 300.0000 mg | ORAL_SOLUTION | Freq: Every day | ORAL | 3 refills | Status: DC

## 2017-03-02 NOTE — Progress Notes (Signed)
Faxed to [facility]

## 2017-03-03 ENCOUNTER — Ambulatory Visit: Payer: Medicare Other | Attending: Family Medicine | Admitting: Physical Therapy

## 2017-03-05 ENCOUNTER — Other Ambulatory Visit: Payer: Self-pay | Admitting: Family Medicine

## 2017-03-07 ENCOUNTER — Telehealth: Payer: Self-pay | Admitting: Family Medicine

## 2017-03-07 DIAGNOSIS — Z760 Encounter for issue of repeat prescription: Secondary | ICD-10-CM

## 2017-03-09 ENCOUNTER — Ambulatory Visit: Payer: Medicare Other | Admitting: Physical Therapy

## 2017-03-12 ENCOUNTER — Ambulatory Visit: Payer: Medicare Other | Admitting: Physical Therapy

## 2017-03-13 NOTE — Telephone Encounter (Signed)
Notified patient this has been sent to pharmacy

## 2017-03-13 NOTE — Telephone Encounter (Signed)
Patient is requesting a update on medication  Pt contact [phone]

## 2017-03-16 ENCOUNTER — Encounter: Payer: Self-pay | Admitting: Gastroenterology

## 2017-03-16 ENCOUNTER — Ambulatory Visit (INDEPENDENT_AMBULATORY_CARE_PROVIDER_SITE_OTHER): Payer: Medicare Other | Admitting: Gastroenterology

## 2017-03-16 VITALS — BP 111/74 | HR 80 | Temp 98.3°F | Ht 66.0 in | Wt 220.2 lb

## 2017-03-16 DIAGNOSIS — K219 Gastro-esophageal reflux disease without esophagitis: Secondary | ICD-10-CM | POA: Diagnosis not present

## 2017-03-16 DIAGNOSIS — I201 Angina pectoris with documented spasm: Secondary | ICD-10-CM

## 2017-03-16 DIAGNOSIS — K581 Irritable bowel syndrome with constipation: Secondary | ICD-10-CM | POA: Diagnosis not present

## 2017-03-16 DIAGNOSIS — R1319 Other dysphagia: Secondary | ICD-10-CM

## 2017-03-16 DIAGNOSIS — R1310 Dysphagia, unspecified: Secondary | ICD-10-CM | POA: Diagnosis not present

## 2017-03-16 NOTE — Progress Notes (Signed)
Valerie Bellows MD, MRCP(U.K) [address]  [place]  [place], [place] [address]  Main: [phone]  Fax: [phone]   Primary Care Physician: [name], MD  Primary Gastroenterologist:  Dr. Jonathon Vazquez   No chief complaint on file.   HPI: Valerie Vazquez is a 61 y.o. female    Summary of history : I saw her for the first time at the endoscopy unit on 12/12/16 when she came in for  Colonoscopy due to prior history of colon polyps. She was on Eloquis at thjat time. I did find a 6 mm polyp in the cecum which was not excised as she was on eloquis at that time and had not stopped it. At her subsequent visit on 01/13/17 she mentioned that she had a CT abdomen in 09/2016 showed no acute abnormality. She had some RUQ pain, off and on for a month (11/2016-12/2016), occurs 1-2 times a day, lasts for a few minutes, localized, did  not wake her up from her sleep , eating or drinking she cant be sure if made a difference . Pain relived after a bowel movement , not affected by passage of gas. She had a bowel movement once every few days, very hard when she has the pain , did alternate between constipation and diarrhea. No NSAID's .   She had mentioned issues with swallowing for a month , felt  "something is crushing her windpipe when I am eating " " happened when I was eating steak and it was sitting there and I tried coughing up and it cleared", pointed to her neck where it got stuck . Has also gone the "wrong way " and " start coughing" . She had gained weight 15 lbs recently and her symptoms have got worse too in the same time. She had been under a lot of financial stress recently. She was Protonix daily after food.    Interval history   01/13/2017-  03/16/2017  Colonoscopy 02/17/17 - 6 mm serrated adenoma resected EGD 02/17/17-normal ( at the time of endoscopy she had no dysphagia)  No issues with swallowing since last visit. No abdominal pain either, constipation is much better , not on  any laxatives. She is on protonix 40 mg and notices heartburn even if she misses a single.     Current Outpatient Prescriptions  Medication Sig Dispense Refill  . albuterol (PROVENTIL HFA;VENTOLIN HFA) 108 (90 Base) MCG/ACT inhaler Inhale 2 puffs into the lungs every 6 (six) hours as needed for wheezing or shortness of breath.    Marland Kitchen apixaban (ELIQUIS) 5 MG TABS tablet Take 1 tablet (5 mg total) by mouth 2 (two) times daily. 60 tablet 5  . carvedilol (COREG) 3.125 MG tablet Take 1 tablet (3.125 mg total) by mouth 2 (two) times daily with a meal. 60 tablet 11  . cyclobenzaprine (FLEXERIL) 10 MG tablet TAKE 1 TABLET BY MOUTH TWICE A DAY AS NEEDED FOR MUSCLE SPASMS 30 tablet 0  . dicyclomine (BENTYL) 20 MG tablet TAKE 1 TABLET BY MOUTH 3 TIMES A DAY AS NEEDED FOR SPASMS 90 tablet 1  . diltiazem (CARDIZEM SR) 60 MG 12 hr capsule Take 1 capsule (60 mg total) by mouth every 12 (twelve) hours. 60 capsule 11  . diphenhydrAMINE (BENADRYL) 25 MG tablet Take 1 tablet (25 mg total) by mouth every 6 (six) hours. 12 tablet 0  . enoxaparin (LOVENOX) 100 MG/ML injection Inject 1 mL (100 mg total) into the skin every 12 (twelve) hours. See  instructions provided in writing to patient. 7 mL 0  . escitalopram (LEXAPRO) 5 MG tablet TAKE 3 TABLET BY MOUTH ONCE A DAY 90 tablet 1  . ferrous sulfate 300 (60 Fe) MG/5ML syrup Take 5 mLs (300 mg total) by mouth daily. 150 mL 3  . furosemide (LASIX) 20 MG tablet Take 20 mg by mouth daily as needed.    . gabapentin (NEURONTIN) 600 MG tablet Please take 600 mg in the morning, 300 mg in the middle of the day, and 600 mg at night by mouth 270 tablet 1  . isosorbide mononitrate (IMDUR) 30 MG 24 hr tablet Take 1 tablet (30 mg total) by mouth daily. 90 tablet 3  . OLANZapine (ZYPREXA) 10 MG tablet Take 1 tablet (10 mg total) by mouth at bedtime. 90 tablet 0  . ondansetron (ZOFRAN) 4 MG tablet Take 1 tablet (4 mg total) by mouth every 8 (eight) hours as needed for nausea or vomiting.  20 tablet 0  . ondansetron (ZOFRAN) 4 MG tablet TAKE 1 TABLET (4 MG TOTAL) BY MOUTH EVERY 8 (EIGHT) HOURS AS NEEDED FOR NAUSEA OR VOMITING. 20 tablet 0  . pantoprazole (PROTONIX) 40 MG tablet TAKE 1 TABLET BY MOUTH EVERY DAY 60 tablet 0  . polyethylene glycol (MIRALAX / GLYCOLAX) packet Take 17 g by mouth daily. Mix one tablespoon with 8oz of your favorite juice or water every day until you are having soft formed stools. Then start taking once daily if you didn't have a stool the day before. 30 each 0  . potassium chloride (K-DUR) 10 MEQ tablet Take 10 mEq by mouth daily as needed.    . triamcinolone cream (KENALOG) 0.1 % Apply 1 application topically 2 (two) times daily. 30 g 0   No current facility-administered medications for this visit.     Allergies as of 03/16/2017 - Review Complete 02/26/2017  Allergen Reaction Noted  . Abilify [aripiprazole] Other (See Comments) 09/20/2013  . Augmentin [amoxicillin-pot clavulanate] Diarrhea and Nausea And Vomiting 09/20/2013  . Bactrim [sulfamethoxazole-trimethoprim] Diarrhea 09/20/2013  . Ceftin [cefuroxime axetil] Diarrhea 09/20/2013  . Coconut oil  10/19/2015  . Diclofenac Other (See Comments) 08/01/2014  . Dye fdc red [red dye]  01/22/2016  . Indocin [indomethacin] Other (See Comments) 09/20/2013  . Linaclotide Diarrhea 12/09/2014  . Lisinopril Diarrhea and Other (See Comments) 09/20/2013  . Morphine Other (See Comments) 12/09/2014  . Morphine and related Other (See Comments) 09/20/2013  . Simvastatin Other (See Comments) 08/01/2014  . Sulfa antibiotics Diarrhea 12/09/2014  . Sulfamethoxazole-trimethoprim Diarrhea 12/09/2014  . Wellbutrin [bupropion] Other (See Comments) 09/20/2013  . Quetiapine fumarate Palpitations 08/01/2014    ROS:  General: Negative for anorexia, weight loss, fever, chills, fatigue, weakness. ENT: Negative for hoarseness, difficulty swallowing , nasal congestion. CV: Negative for chest pain, angina, palpitations,  dyspnea on exertion, peripheral edema.  Respiratory: Negative for dyspnea at rest, dyspnea on exertion, cough, sputum, wheezing.  GI: See history of present illness. GU:  Negative for dysuria, hematuria, urinary incontinence, urinary frequency, nocturnal urination.  Endo: Negative for unusual weight change.    Physical Examination:   BP 111/74 (BP Location: Left Arm, Patient Position: Sitting, Cuff Size: Large)   Pulse 80   Temp 98.3 F (36.8 C) (Oral)   Ht 5\' 6"  (1.676 m)   Wt 220 lb 3.2 oz (99.9 kg)   BMI 35.54 kg/m   General: Well-nourished, well-developed in no acute distress.  Eyes: No icterus. Conjunctivae pink. Mouth: Oropharyngeal mucosa moist and pink , no  lesions erythema or exudate. Lungs: Clear to auscultation bilaterally. Non-labored. Heart: Regular rate and rhythm, no murmurs rubs or gallops.  Abdomen: Bowel sounds are normal, nontender, nondistended, no hepatosplenomegaly or masses, no abdominal bruits or hernia , no rebound or guarding.   Extremities: No lower extremity edema. No clubbing or deformities. Neuro: Alert and oriented x 3.  Grossly intact. Skin: Warm and dry, no jaundice.   Psych: Alert and cooperative, normal mood and affect.   Imaging Studies: Dg Chest 2 View  Result Date: 02/22/2017 CLINICAL DATA:  Patient reports past few days having off/on chest pain and wheezing. Also reports having low grade fever at home EXAM: CHEST  2 VIEW COMPARISON:  11/26/2016 FINDINGS: Cardiac silhouette is normal in size. No mediastinal or hilar masses. No evidence of adenopathy. Clear lungs.  No pleural effusion or pneumothorax. Stable changes from a prior anterior cervical spine fusion. Skeletal structures are intact. IMPRESSION: No active cardiopulmonary disease. Electronically Signed   By: Lajean Manes M.D.   On: 02/22/2017 22:31   Ct Angio Chest Pe W And/or Wo Contrast  Result Date: 02/23/2017 CLINICAL DATA:  Intermittent chest pain and wheezing for few days,  low-grade fever. Off blood thinners for colonoscopy. History of pulmonary embolism. EXAM: CT ANGIOGRAPHY CHEST WITH CONTRAST TECHNIQUE: Multidetector CT imaging of the chest was performed using the standard protocol during bolus administration of intravenous contrast. Multiplanar CT image reconstructions and MIPs were obtained to evaluate the vascular anatomy. CONTRAST:  75 cc Isovue 370 COMPARISON:  Chest radiograph February 22, 2017 and CT chest August 15, 2016 FINDINGS: CARDIOVASCULAR: Adequate contrast opacification of the pulmonary artery's. Main pulmonary artery is not enlarged. No pulmonary arterial filling defects to the level of the subsegmental branches. Heart size is normal, no right heart strain. No pericardial effusion. Thoracic aorta is normal course and caliber, unremarkable. MEDIASTINUM/NODES: No lymphadenopathy by CT size criteria. LUNGS/PLEURA: Tracheobronchial tree is patent, no pneumothorax. Small bilateral pleural effusions. Dependent atelectasis. No focal consolidation. Mild heterogeneous lung attenuation. History UPPER ABDOMEN: Status post cholecystectomy. No acute process in the upper abdomen. Inferior vena cava filter noted on the localizer. MUSCULOSKELETAL: Visualized soft tissues and included osseous structures are nonacute. ACDF. Review of the MIP images confirms the above findings. IMPRESSION: No acute pulmonary embolism. Small pleural effusions and dependent atelectasis. Heterogeneous lung attenuation associated with small airway disease. Electronically Signed   By: Elon Alas M.D.   On: 02/23/2017 05:54    Assessment and Plan:   Valerie Vazquez is a 61 y.o. y/o female here today for  follow up for constipation , dysphagia, abdominal pain   1. Constipation -resolved 2. Dysphagia - resolved with PPI- likely acid reflux causing esophageal dysmotility  3. Abdominal pain likely secondary to constipation which has also resolved   Plan  1.  GERD patient information .  Counseled on life style changes, suggest to use PPI first thing in the morning on empty stomach and eat 30 minutes after. Advised on the use of a wedge pillow at night , avoid meals for 2 hours prior to bed time. Weight loss.Discussed the risks and benefits of long term PPI use including but not limited to bone loss, chronic kidney disease, infections , low magnesium . Aim to use at the lowest dose for the shortest period of time   She is not keen on trying to decrease dose of Protonix today but I will have her return in 6 months to try wean her down and shift to zantac.  Dr Valerie Bellows  MD,MRCP [facility]) Follow up in 6 months

## 2017-03-24 ENCOUNTER — Telehealth: Payer: Self-pay | Admitting: Family Medicine

## 2017-03-24 NOTE — Telephone Encounter (Signed)
Pt called c/o rt eye pain with some secretion. Pt states that she has been having headaches off and on. Please advise, thank you!  Call pt @ [phone]

## 2017-03-24 NOTE — Telephone Encounter (Signed)
Spoke with the patient, she is having right eye pain, eye is not red, secretions are clear coming out of eye.  She has had a headache for the past 2 days, only tried generic tylenol for it.  Requested appt with PCP, advised, no appts today, requested appt tomorrow, scheduled, thanks

## 2017-03-25 ENCOUNTER — Ambulatory Visit (INDEPENDENT_AMBULATORY_CARE_PROVIDER_SITE_OTHER): Payer: Medicare Other | Admitting: Family Medicine

## 2017-03-25 ENCOUNTER — Encounter: Payer: Self-pay | Admitting: Family Medicine

## 2017-03-25 VITALS — BP 120/84 | HR 79 | Temp 98.6°F | Wt 223.6 lb

## 2017-03-25 DIAGNOSIS — T148XXA Other injury of unspecified body region, initial encounter: Secondary | ICD-10-CM | POA: Diagnosis not present

## 2017-03-25 DIAGNOSIS — I201 Angina pectoris with documented spasm: Secondary | ICD-10-CM

## 2017-03-25 DIAGNOSIS — E119 Type 2 diabetes mellitus without complications: Secondary | ICD-10-CM | POA: Diagnosis not present

## 2017-03-25 DIAGNOSIS — H5711 Ocular pain, right eye: Secondary | ICD-10-CM

## 2017-03-25 MED ORDER — CYCLOBENZAPRINE HCL 10 MG PO TABS: 10.0000 mg | ORAL_TABLET | Freq: Three times a day (TID) | ORAL | 0 refills | Status: DC | PRN

## 2017-03-25 NOTE — Assessment & Plan Note (Signed)
Unknown cause of bruising on right triceps. No underlying defects. She'll monitor and if it enlarges or changes or she develops bruising or bleeding from elsewhere she'll let us know.

## 2017-03-25 NOTE — Assessment & Plan Note (Addendum)
Patient with new onset right eye pain. Vision is intact. Given discomfort with movement of her eye and slight puffiness surrounding her eye concern would be for bacterial infection though potentially could be a viral issue given crusting and watering. No eye erythema which makes glaucoma seem less likely. Given the complaint of new onset right eye pain we will have her see ophthalmology today for further evaluation to determine a cause.

## 2017-03-25 NOTE — Patient Instructions (Signed)
Nice to see you. We will get you to see ophthalmology. Please monitor the bruise on your right arm.

## 2017-03-25 NOTE — Progress Notes (Signed)
  [name], MD Phone: [phone]  Valerie Vazquez is a 61 y.o. female who presents today for same-day visit.  Patient notes onset of right eye pain 3 days ago. No prior history of a pain. Notes it feels sore and hurts when she moves her eye. Mild pain without moving her right. Notes over the last day or so there has been a slight puffiness underneath her right eye. No vision changes. No erythema of the eye or the skin. Has noted some watering and crusting.  Patient additionally notes a bruise on the posterior right triceps. Noticed it just today. No injury noted. She has a little bit tender in the area. She is on Eliquis.  Patient requests 30 day supply of her muscle relaxer. This was sent to her pharmacy.  PMH: Former smoker   ROS see history of present illness  Objective  Physical Exam Vitals:   03/25/17 1105  BP: 120/84  Pulse: 79  Temp: 98.6 F (37 C)    BP Readings from Last 3 Encounters:  03/25/17 120/84  03/16/17 111/74  02/25/17 130/70   Wt Readings from Last 3 Encounters:  03/25/17 223 lb 9.6 oz (101.4 kg)  03/16/17 220 lb 3.2 oz (99.9 kg)  02/25/17 220 lb 3.2 oz (99.9 kg)    Physical Exam  Constitutional: No distress.  HENT:  Head: Normocephalic and atraumatic.  Eyes: Pupils are equal, round, and reactive to light.  No conjunctival erythema, no gross changes to the cornea, there is mild puffiness of the lower eyelid, there is discomfort in the right eye on extraocular movements with it being particularly more painful when looking down, extraocular movements are intact bilaterally  Cardiovascular: Normal rate, regular rhythm and normal heart sounds.   Pulmonary/Chest: Effort normal and breath sounds normal.  Neurological: She is alert. Gait normal.  Skin: She is not diaphoretic.        Assessment/Plan: Please see individual problem list.  Acute right eye pain Patient with new onset right eye pain. Vision is intact. Given discomfort with  movement of her eye and slight puffiness surrounding her eye concern would be for bacterial infection though potentially could be a viral issue given crusting and watering. No eye erythema which makes glaucoma seem less likely. Given the complaint of new onset right eye pain we will have her see ophthalmology today for further evaluation to determine a cause.  Bruise Unknown cause of bruising on right triceps. No underlying defects. She'll monitor and if it enlarges or changes or she develops bruising or bleeding from elsewhere she'll let us know.   Orders Placed This Encounter  Procedures  . Ambulatory referral to Ophthalmology    Referral Priority:   Routine    Referral Type:   Consultation    Referral Reason:   Specialty Services Required    Requested Specialty:   Ophthalmology    Number of Visits Requested:   1    Meds ordered this encounter  Medications  . cyclobenzaprine (FLEXERIL) 10 MG tablet    Sig: Take 1 tablet (10 mg total) by mouth 3 (three) times daily as needed for muscle spasms.    Dispense:  90 tablet    Refill:  0   [name], MD [place]

## 2017-03-28 ENCOUNTER — Emergency Department
Admission: EM | Admit: 2017-03-28 | Discharge: 2017-03-28 | Disposition: A | Discharge status: Home / Self Care | Payer: Medicare Other | Attending: Emergency Medicine | Admitting: Emergency Medicine

## 2017-03-28 ENCOUNTER — Emergency Department: Payer: Medicare Other

## 2017-03-28 DIAGNOSIS — Y939 Activity, unspecified: Secondary | ICD-10-CM | POA: Insufficient documentation

## 2017-03-28 DIAGNOSIS — Y999 Unspecified external cause status: Secondary | ICD-10-CM | POA: Diagnosis not present

## 2017-03-28 DIAGNOSIS — M542 Cervicalgia: Secondary | ICD-10-CM | POA: Diagnosis not present

## 2017-03-28 DIAGNOSIS — I10 Essential (primary) hypertension: Secondary | ICD-10-CM | POA: Insufficient documentation

## 2017-03-28 DIAGNOSIS — Z7901 Long term (current) use of anticoagulants: Secondary | ICD-10-CM | POA: Diagnosis not present

## 2017-03-28 DIAGNOSIS — Z79899 Other long term (current) drug therapy: Secondary | ICD-10-CM | POA: Diagnosis not present

## 2017-03-28 DIAGNOSIS — X58XXXA Exposure to other specified factors, initial encounter: Secondary | ICD-10-CM | POA: Insufficient documentation

## 2017-03-28 DIAGNOSIS — Y929 Unspecified place or not applicable: Secondary | ICD-10-CM | POA: Insufficient documentation

## 2017-03-28 DIAGNOSIS — G8929 Other chronic pain: Secondary | ICD-10-CM | POA: Diagnosis not present

## 2017-03-28 DIAGNOSIS — S161XXA Strain of muscle, fascia and tendon at neck level, initial encounter: Secondary | ICD-10-CM | POA: Insufficient documentation

## 2017-03-28 DIAGNOSIS — I2510 Atherosclerotic heart disease of native coronary artery without angina pectoris: Secondary | ICD-10-CM | POA: Insufficient documentation

## 2017-03-28 DIAGNOSIS — Z87891 Personal history of nicotine dependence: Secondary | ICD-10-CM | POA: Insufficient documentation

## 2017-03-28 MED ORDER — DEXAMETHASONE SODIUM PHOSPHATE 10 MG/ML IJ SOLN
10.0000 mg | Freq: Once | INTRAMUSCULAR | Status: AC
  Administered 2017-03-28: 10 mg via INTRAMUSCULAR
  Filled 2017-03-28: qty 1

## 2017-03-28 MED ORDER — DIAZEPAM 2 MG PO TABS: 2.0000 mg | ORAL_TABLET | Freq: Three times a day (TID) | ORAL | 0 refills | Status: DC | PRN

## 2017-03-28 MED ORDER — PREDNISONE 50 MG PO TABS: 50.0000 mg | ORAL_TABLET | Freq: Every day | ORAL | 0 refills | Status: DC

## 2017-03-28 NOTE — ED Notes (Signed)
Patient ambulated to stat desk in NAD requesting a glass of water.

## 2017-03-28 NOTE — ED Triage Notes (Signed)
Patient reports neck "cracking" for awhile.  Reports today turned head and now having pain with movement of neck.

## 2017-03-28 NOTE — ED Provider Notes (Signed)
[facility] Emergency Department Provider Note  ____________________________________________  Time seen: Approximately 10:37 PM  I have reviewed the triage vital signs and the nursing notes.   HISTORY  Chief Complaint Neck Pain    HPI Valerie Vazquez is a 61 y.o. female who presents emergency department complaining of left-sided neck pain. Patient reports that she's had a "cracking" sensation to her neck for the past several days. Today she had sudden onset of left-sided neck pain. Patient denies any radicular symptoms at this time. No headache. No chest pain or shortness of breath. Patient has a history of ankylosing spondylolysis with cervical fusion. Patient takes daily muscle relaxer and states that this is not improved symptoms. She is unable to take NSAIDs. No other complaints at this time.   Past Medical History:  Diagnosis Date  . Anemia   . Bilateral renal cysts   . Chronic back pain    "upper and lower" (09/19/2014)  . Depression    "major" (09/19/2014)  . DVT (deep venous thrombosis) ([place])   . Esophageal spasm   . Gallstones   . Gastritis   . Gastroenteritis   . GERD (gastroesophageal reflux disease)   . Headache    "couple /month lately" (09/19/2014)  . High blood pressure   . High cholesterol   . History of blood transfusion 1985   related to hysterectomy  . Hypertension   . IBS (irritable bowel syndrome)   . Migraine    "a few times/yr" (09/19/2014)  . Myocardial infarction ([place]) 10/2010 X 3   "while hospitalized"  . Osteoarthritis    "qwhere; mostly around my joints" (09/19/2014)  . Pneumonia 04/2014  . PTSD (post-traumatic stress disorder)   . Pulmonary embolism ([place]) 04/2001; 09/19/2014   "after gallbladder OR; "  . Schizoaffective disorder ([place])   . Sleep apnea    "mild" (09/19/2014)  . Snoring    a. sleep study 5/16: No OSA  . Spinal stenosis   . Spondylosis   . Type II diabetes mellitus ([place])    "dx'd in 2007; lost weight;  no RX for ~ 4 yr now" (09/19/2014)    Patient Active Problem List   Diagnosis Date Noted  . Acute right eye pain 03/25/2017  . Elevated glucose 02/25/2017  . Bruise 02/25/2017  . Wheezing 02/25/2017  . Paresthesia 02/06/2017  . Falls 01/30/2017  . Colon polyp 01/30/2017  . RUQ pain 01/12/2017  . Heart disease 12/09/2016  . Plantar fasciitis of left foot 11/07/2016  . Costochondritis 10/06/2016  . BRBPR (bright red blood per rectum) 10/06/2016  . CAD (coronary artery disease) 09/28/2016  . Diarrhea 09/16/2016  . Epigastric discomfort 09/16/2016  . Schizoaffective disorder, depressive type ([place]) 08/28/2016  . Suicide attempt ([place]) 08/27/2016  . Colitis 04/08/2016  . Absolute anemia 01/29/2016  . Chronic shoulder pain (Location of Primary Source of Pain) (Left) 01/29/2016  . Osteoarthritis of hip (Bilateral) 01/29/2016  . Lumbar central spinal stenosis (moderate at L4-5; mild at L2-3 and L3-4) 01/29/2016  . Hx of cervical spine surgery 01/29/2016  . Numbness 11/19/2015  . Breast pain 10/19/2015  . Chest pain 10/14/2015  . SOB (shortness of breath) 07/31/2015  . Pleural effusion, bilateral 07/31/2015  . Esophageal spasm 06/05/2015  . History of DVT (deep vein thrombosis) 02/25/2015  . 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 Constipation 12/12/2014  . Coronary vasospasm ([place]) 12/09/2014  . History of pulmonary embolus ([place]) 09/19/2014  . Anxiety and depression 04/13/2014  . IBS (irritable bowel syndrome) 04/11/2014  .  GERD (gastroesophageal reflux disease) 04/11/2014  . Insomnia 04/11/2014  . Essential hypertension 10/28/2013  . Anemia 10/28/2013  . Hyperlipidemia 10/28/2013    Past Surgical History:  Procedure Laterality Date  . ABDOMINAL HYSTERECTOMY  1985  . ANTERIOR CERVICAL DECOMP/DISCECTOMY FUSION  10/2012  . APPENDECTOMY  1985  . BACK SURGERY    . BREAST CYST EXCISION Right   . BREAST EXCISIONAL BIOPSY Right YRS AGO   NEG  . CARDIAC CATHETERIZATION   2000's; 2009  .  CHOLECYSTECTOMY  2002  . COLONOSCOPY WITH PROPOFOL N/A 12/12/2016   Procedure: COLONOSCOPY WITH PROPOFOL;  Surgeon: [name], MD;  Location: [facility] ENDOSCOPY;  Service: Endoscopy;  Laterality: N/A;  . COLONOSCOPY WITH PROPOFOL N/A 02/17/2017   Procedure: COLONOSCOPY WITH PROPOFOL;  Surgeon: [name], MD;  Location: [facility] ENDOSCOPY;  Service: Gastroenterology;  Laterality: N/A;  . ESOPHAGOGASTRODUODENOSCOPY (EGD) WITH PROPOFOL N/A 02/17/2017   Procedure: ESOPHAGOGASTRODUODENOSCOPY (EGD) WITH PROPOFOL;  Surgeon: [name], MD;  Location: [facility] ENDOSCOPY;  Service: Gastroenterology;  Laterality: N/A;  . EXCISION/RELEASE BURSA HIP Right 12/1984  . HERNIA REPAIR  1985  . LEFT HEART CATHETERIZATION WITH CORONARY ANGIOGRAM N/A 12/09/2014   Procedure: LEFT HEART CATHETERIZATION WITH CORONARY ANGIOGRAM;  Surgeon: Burnell Blanks, MD;  Location: [facility] CATH LAB;  Service: Cardiovascular;  Laterality: N/A;  . TONSILLECTOMY AND ADENOIDECTOMY  ~ 1968  . TOTAL HIP ARTHROPLASTY Left 04-06-2014  . UMBILICAL HERNIA REPAIR  1985  . VENA CAVA FILTER PLACEMENT  03/2014    Prior to Admission medications   Medication Sig Start Date End Date Taking? Authorizing Provider  albuterol (PROVENTIL HFA;VENTOLIN HFA) 108 (90 Base) MCG/ACT inhaler Inhale 2 puffs into the lungs every 6 (six) hours as needed for wheezing or shortness of breath.    [provider]  apixaban (ELIQUIS) 5 MG TABS tablet Take 1 tablet (5 mg total) by mouth 2 (two) times daily. 09/25/16   [name], MD  carvedilol (COREG) 3.125 MG tablet Take 1 tablet (3.125 mg total) by mouth 2 (two) times daily with a meal. 01/09/17   Burnell Blanks, MD  cyclobenzaprine (FLEXERIL) 10 MG tablet Take 1 tablet (10 mg total) by mouth 3 (three) times daily as needed for muscle spasms. 03/25/17   [name], MD  diazepam (VALIUM) 2 MG tablet Take 1 tablet (2 mg total) by mouth every 8 (eight) hours as needed for muscle spasms. 03/28/17    Cuthriell, Charline Bills, PA-C  dicyclomine (BENTYL) 20 MG tablet TAKE 1 TABLET BY MOUTH 3 TIMES A DAY AS NEEDED FOR SPASMS 11/26/16   [name], MD  diltiazem (CARDIZEM SR) 60 MG 12 hr capsule Take 1 capsule (60 mg total) by mouth every 12 (twelve) hours. 01/09/17   Burnell Blanks, MD  diphenhydrAMINE (BENADRYL) 25 MG tablet Take 1 tablet (25 mg total) by mouth every 6 (six) hours. 09/12/16   Jola Schmidt, MD  escitalopram (LEXAPRO) 5 MG tablet TAKE 3 TABLET BY MOUTH ONCE A DAY 02/04/17   [name], MD  furosemide (LASIX) 20 MG tablet Take 20 mg by mouth daily as needed.    [provider]  gabapentin (NEURONTIN) 600 MG tablet Please take 600 mg in the morning, 300 mg in the middle of the day, and 600 mg at night by mouth 02/04/17   [name], MD  isosorbide mononitrate (IMDUR) 30 MG 24 hr tablet Take 1 tablet (30 mg total) by mouth daily. 02/04/17   [name], MD  OLANZapine (ZYPREXA) 10 MG tablet Take 1 tablet (10 mg total) by mouth at bedtime. 02/04/17   [name], MD  ondansetron (ZOFRAN) 4 MG tablet Take 1 tablet (4 mg total) by mouth every 8 (eight) hours as needed for nausea or vomiting. 01/10/17   [name], MD  pantoprazole (PROTONIX) 40 MG tablet TAKE 1 TABLET BY MOUTH EVERY DAY 03/09/17   [name], MD  polyethylene glycol (MIRALAX / GLYCOLAX) packet Take 17 g by mouth daily. Mix one tablespoon with 8oz of your favorite juice or water every day until you are having soft formed stools. Then start taking once daily if you didn't have a stool the day before. 09/30/16   Merlyn Lot, MD  predniSONE (DELTASONE) 50 MG tablet Take 1 tablet (50 mg total) by mouth daily with breakfast. 03/28/17   Cuthriell, Charline Bills, PA-C  triamcinolone cream (KENALOG) 0.1 % Apply 1 application topically 2 (two) times daily. 09/13/16   Gloriann Loan, PA-C    Allergies Abilify [aripiprazole]; Augmentin [amoxicillin-pot clavulanate]; Bactrim  [sulfamethoxazole-trimethoprim]; Ceftin [cefuroxime axetil]; Coconut oil; Diclofenac; Dye fdc red [red dye]; Indocin [indomethacin]; Linaclotide; Lisinopril; Morphine; Morphine and related; Simvastatin; Sulfa antibiotics; Sulfamethoxazole-trimethoprim; Wellbutrin [bupropion]; and Quetiapine fumarate  Family History  Problem Relation Age of Onset  . Stroke Mother        Deceased  . Lung cancer Father        Deceased  . Healthy Daughter   . Healthy Son        x 2  . Other Son        Suicide  . Heart attack Neg Hx     Social History Social History  Substance Use Topics  . Smoking status: Former Smoker    Packs/day: 1.50    Years: 10.00    Types: Cigarettes    Quit date: 04/12/1979  . Smokeless tobacco: Never Used  . Alcohol use No     Review of Systems  Constitutional: No fever/chills Eyes: No visual changes.  Cardiovascular: no chest pain. Respiratory: no cough. No SOB. Gastrointestinal: No abdominal pain.  No nausea, no vomiting.  Musculoskeletal: Positive for left-sided neck pain. Skin: Negative for rash, abrasions, lacerations, ecchymosis. Neurological: Negative for headaches, focal weakness or numbness. 10-point ROS otherwise negative.  ____________________________________________   PHYSICAL EXAM:  VITAL SIGNS: ED Triage Vitals  Enc Vitals Group     BP 03/28/17 2029 (!) 147/81     Pulse Rate 03/28/17 2029 81     Resp 03/28/17 2029 20     Temp 03/28/17 2029 98 F (36.7 C)     Temp Source 03/28/17 2029 Oral     SpO2 03/28/17 2029 97 %     Weight 03/28/17 2026 223 lb (101.2 kg)     Height 03/28/17 2026 5\' 6"  (1.676 m)     Head Circumference --      Peak Flow --      Pain Score 03/28/17 2026 4     Pain Loc --      Pain Edu? --      Excl. in [place]? --      Constitutional: Alert and oriented. Well appearing and in no acute distress. Eyes: Conjunctivae are normal. PERRL. EOMI. Head: Atraumatic. ENT:      Ears:       Nose: No congestion/rhinnorhea.       Mouth/Throat: Mucous membranes are moist.  Neck: No stridor.  No midline cervical spine tenderness to palpation. Patient is tender to palpation  over the left-sided paraspinal muscles and left-sided trapezius muscle. Spasms are appreciated. Radial pulse intact bilateral upper extremities. Sensation intact and equal bilateral upper extremity's.  Cardiovascular: Normal rate, regular rhythm. Normal S1 and S2.  Good peripheral circulation. Respiratory: Normal respiratory effort without tachypnea or retractions. Lungs CTAB. Good air entry to the bases with no decreased or absent breath sounds. Musculoskeletal: Full range of motion to all extremities. No gross deformities appreciated. Neurologic:  Normal speech and language. No gross focal neurologic deficits are appreciated.  Skin:  Skin is warm, dry and intact. No rash noted. Psychiatric: Mood and affect are normal. Speech and behavior are normal. Patient exhibits appropriate insight and judgement.   ____________________________________________   LABS (all labs ordered are listed, but only abnormal results are displayed)  Labs Reviewed - No data to display ____________________________________________  EKG   ____________________________________________  RADIOLOGY Diamantina Providence Cuthriell, personally viewed and evaluated these images (plain radiographs) as part of my medical decision making, as well as reviewing the written report by the radiologist.  Dg Cervical Spine 2-3 Views  Result Date: 03/28/2017 CLINICAL DATA:  Neck pain after turning head today EXAM: CERVICAL SPINE - 2-3 VIEW COMPARISON:  02/06/2017 FINDINGS: Solidly ossified interbody fusion from C5 through C7. Intact appearance of the anterior plate screw fixation hardware. The cervical vertebrae are normal in height. No evidence of fracture or other acute bone abnormality. Moderate cervical degenerative disc changes, greatest at C3-4 and C4-5. Facet articulations are fairly well  preserved. No bone lesion or bony destruction. No acute soft tissue abnormality. IMPRESSION: Solidly ossified interbody fusion with anterior fixation hardware, C5 through C7. Moderate cervical degenerative disc changes. No acute findings. Electronically Signed   By: Andreas Newport M.D.   On: 03/28/2017 21:20    ____________________________________________    PROCEDURES  Procedure(s) performed:    Procedures    Medications  dexamethasone (DECADRON) injection 10 mg (not administered)     ____________________________________________   INITIAL IMPRESSION / ASSESSMENT AND PLAN / ED COURSE  Pertinent labs & imaging results that were available during my care of the patient were reviewed by me and considered in my medical decision making (see chart for details).  Review of the [place] CSRS was performed in accordance of the [place] prior to dispensing any controlled drugs.     Patient's diagnosis is consistent with Posterior lateral cervical neck muscles spasms. X-ray reveals no acute osseous 190. No concerning symptoms warranting further investigation with imaging or labs at this time. Patient is given shot of steroids emergency department. Patient will be placed on a short course of Valium and oral prednisone. Patient does take  daily muscle relaxer and asked such Valium will be used as a muscle relaxer for short course.. Patient is to follow up with primary care as needed or otherwise directed. Patient is given ED precautions to return to the ED for any worsening or new symptoms.     ____________________________________________  FINAL CLINICAL IMPRESSION(S) / ED DIAGNOSES  Final diagnoses:  Posterolateral cervical muscle strain, initial encounter      NEW MEDICATIONS STARTED DURING THIS VISIT:  New Prescriptions   DIAZEPAM (VALIUM) 2 MG TABLET    Take 1 tablet (2 mg total) by mouth every 8 (eight) hours as needed for muscle spasms.   PREDNISONE (DELTASONE) 50 MG TABLET     Take 1 tablet (50 mg total) by mouth daily with breakfast.        This chart was dictated using voice recognition software/Dragon. Despite  best efforts to proofread, errors can occur which can change the meaning. Any change was purely unintentional.    Darletta Moll, PA-C 03/28/17 2328    Nance Pear, MD 03/28/17 [phone]

## 2017-03-30 ENCOUNTER — Other Ambulatory Visit: Payer: Self-pay | Admitting: Family Medicine

## 2017-03-31 IMAGING — DX DG CHEST 2V
2 series · 2 of 2 positions shown · non-contrast
Comparison: 03/22/2015

CLINICAL DATA: Intermittent chest pain described as a dull ache for
1 week. History of a myocardial infarction in 6586. History of
diabetes and hypertension.

EXAM:
CHEST  2 VIEW

[chest pa]
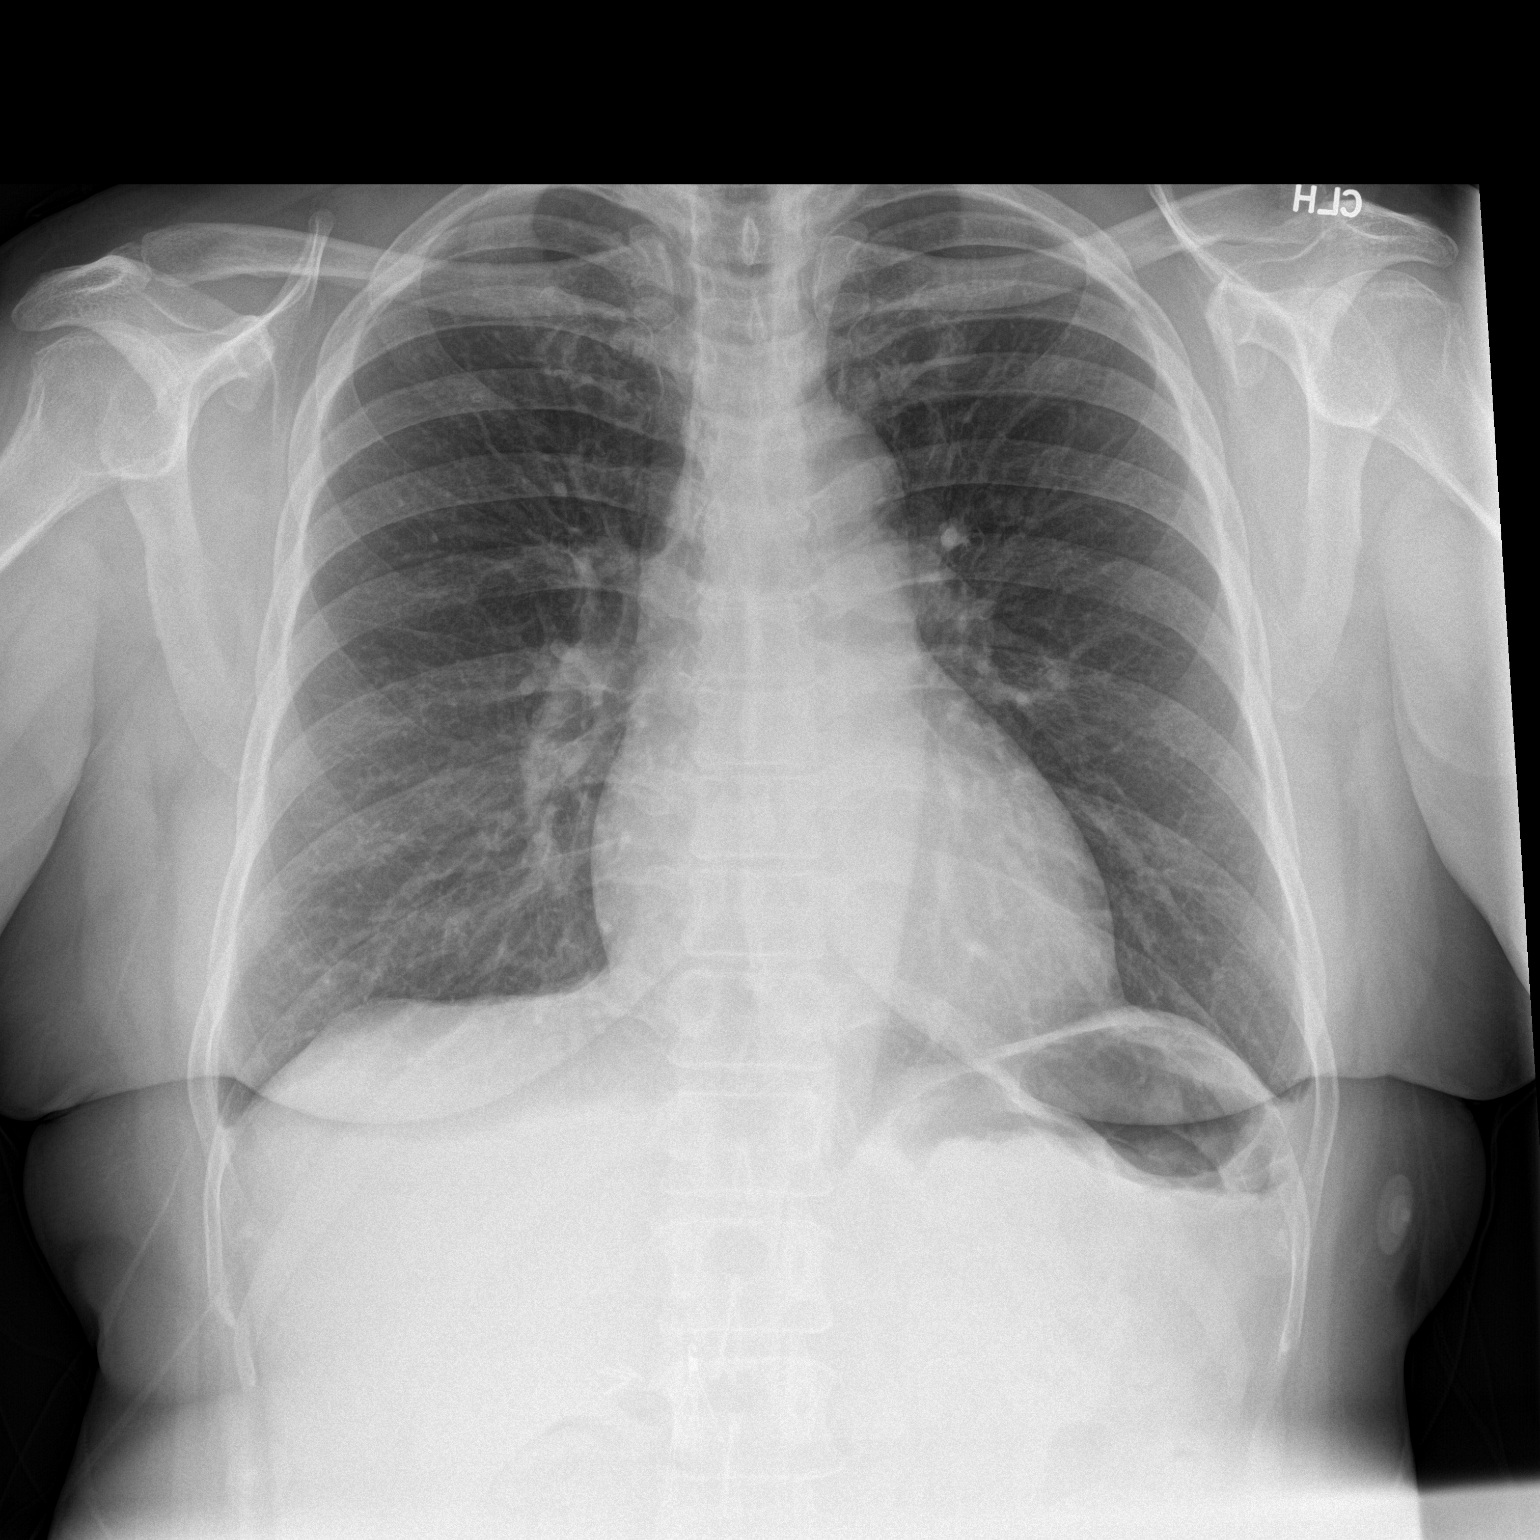

[chest lat]
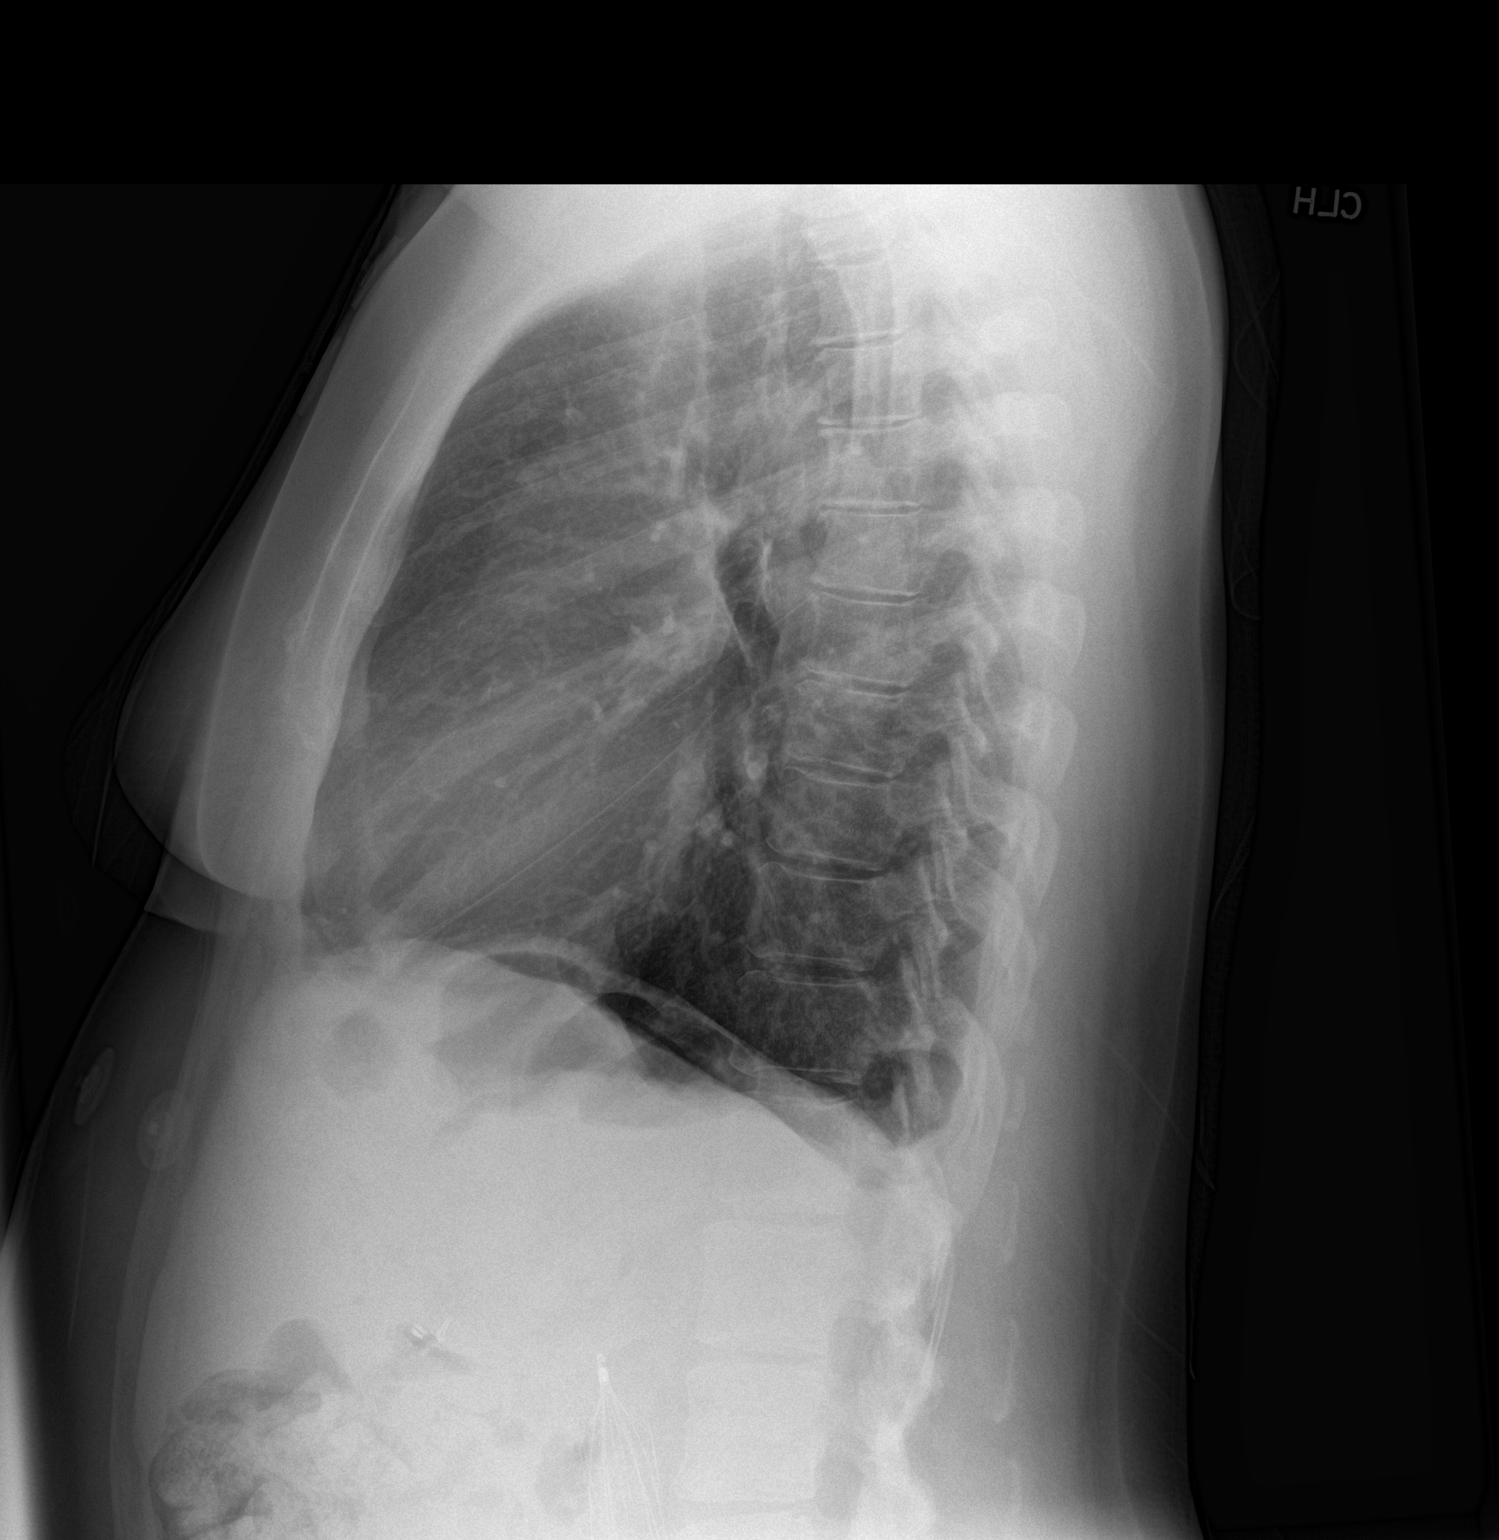

[2 of 2 positions shown; findings below may reference images not displayed]

FINDINGS: Cardiac silhouette is borderline enlarged. No mediastinal or hilar
masses or evidence of adenopathy.

Lungs are clear.  No pleural effusion or pneumothorax.

There are changes from previous anterior cervical spine fusion,
stable.
IMPRESSION: No acute cardiopulmonary disease.

## 2017-04-18 ENCOUNTER — Other Ambulatory Visit: Payer: Self-pay | Admitting: Family Medicine

## 2017-04-25 IMAGING — DX DG ABDOMEN 2V
2 series · 2 of 2 positions shown · non-contrast
Comparison: 02/20/2015

CLINICAL DATA: Chronic constipation. Left upper quadrant pain,
nausea. Bright red blood in stool.

EXAM:
ABDOMEN - 2 VIEW

[abdomen erect]
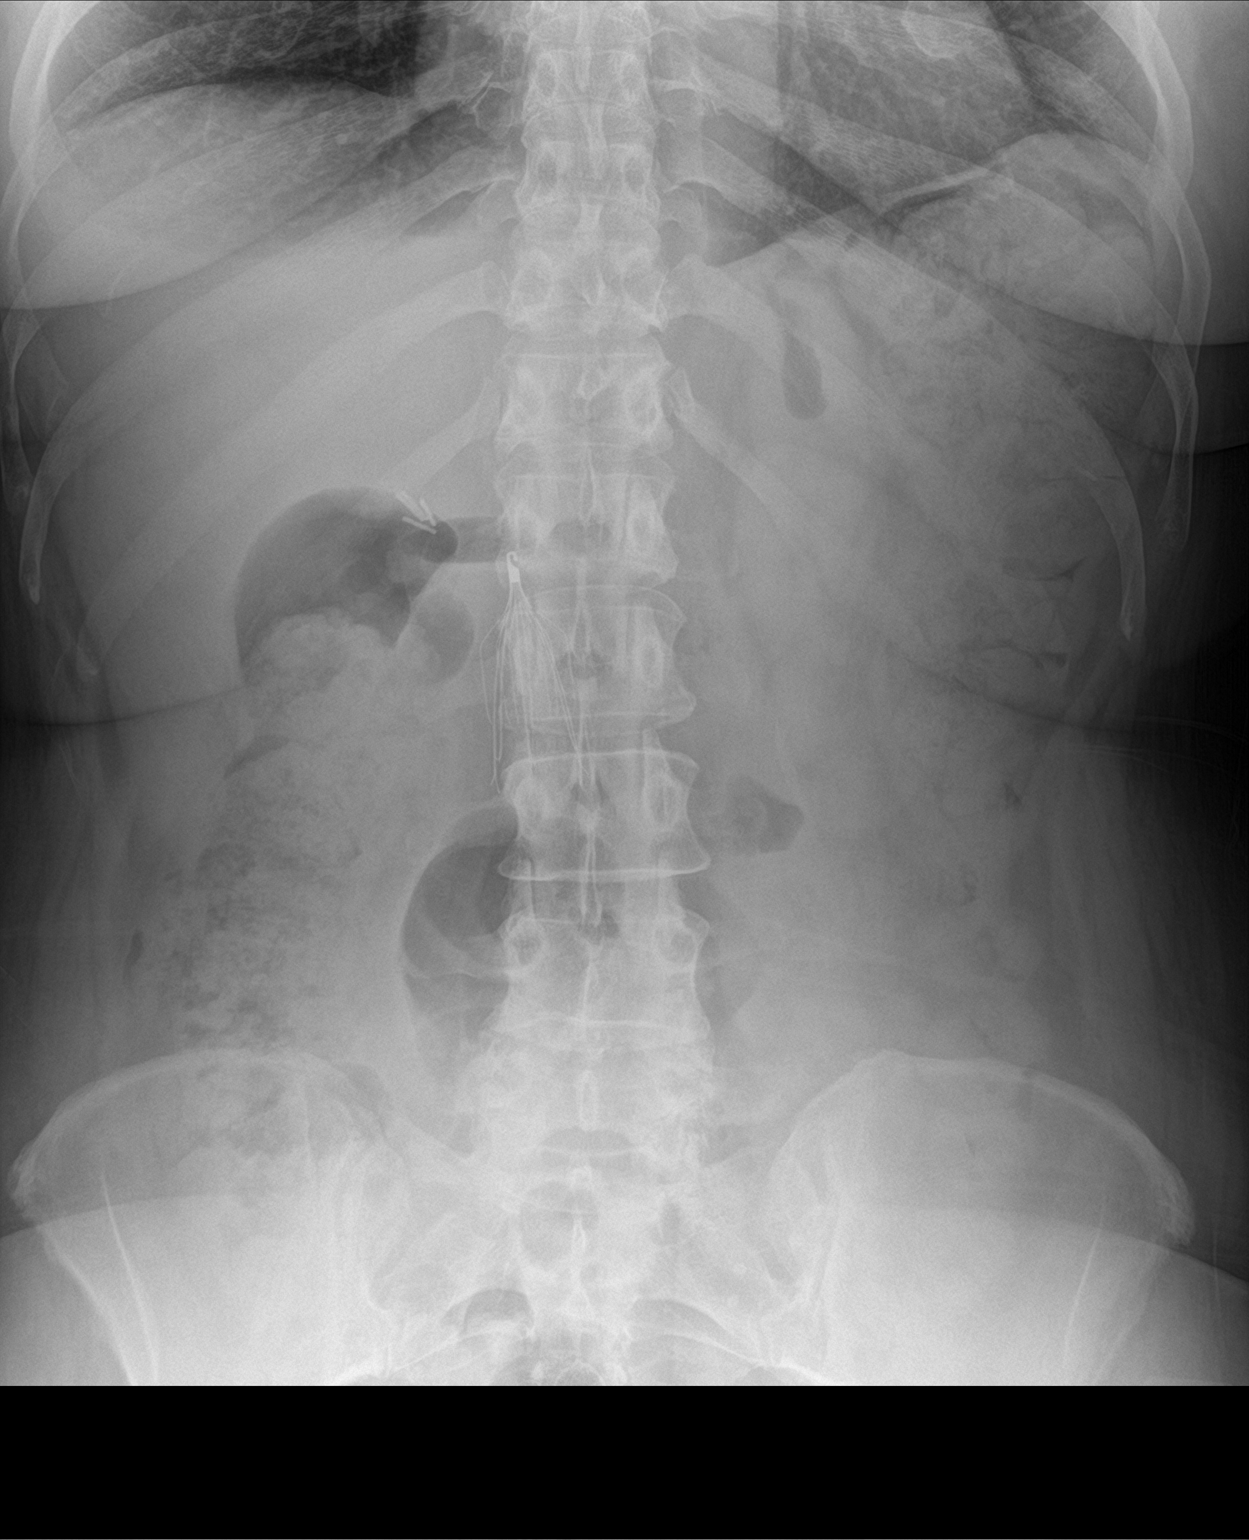

[abdomen supine]
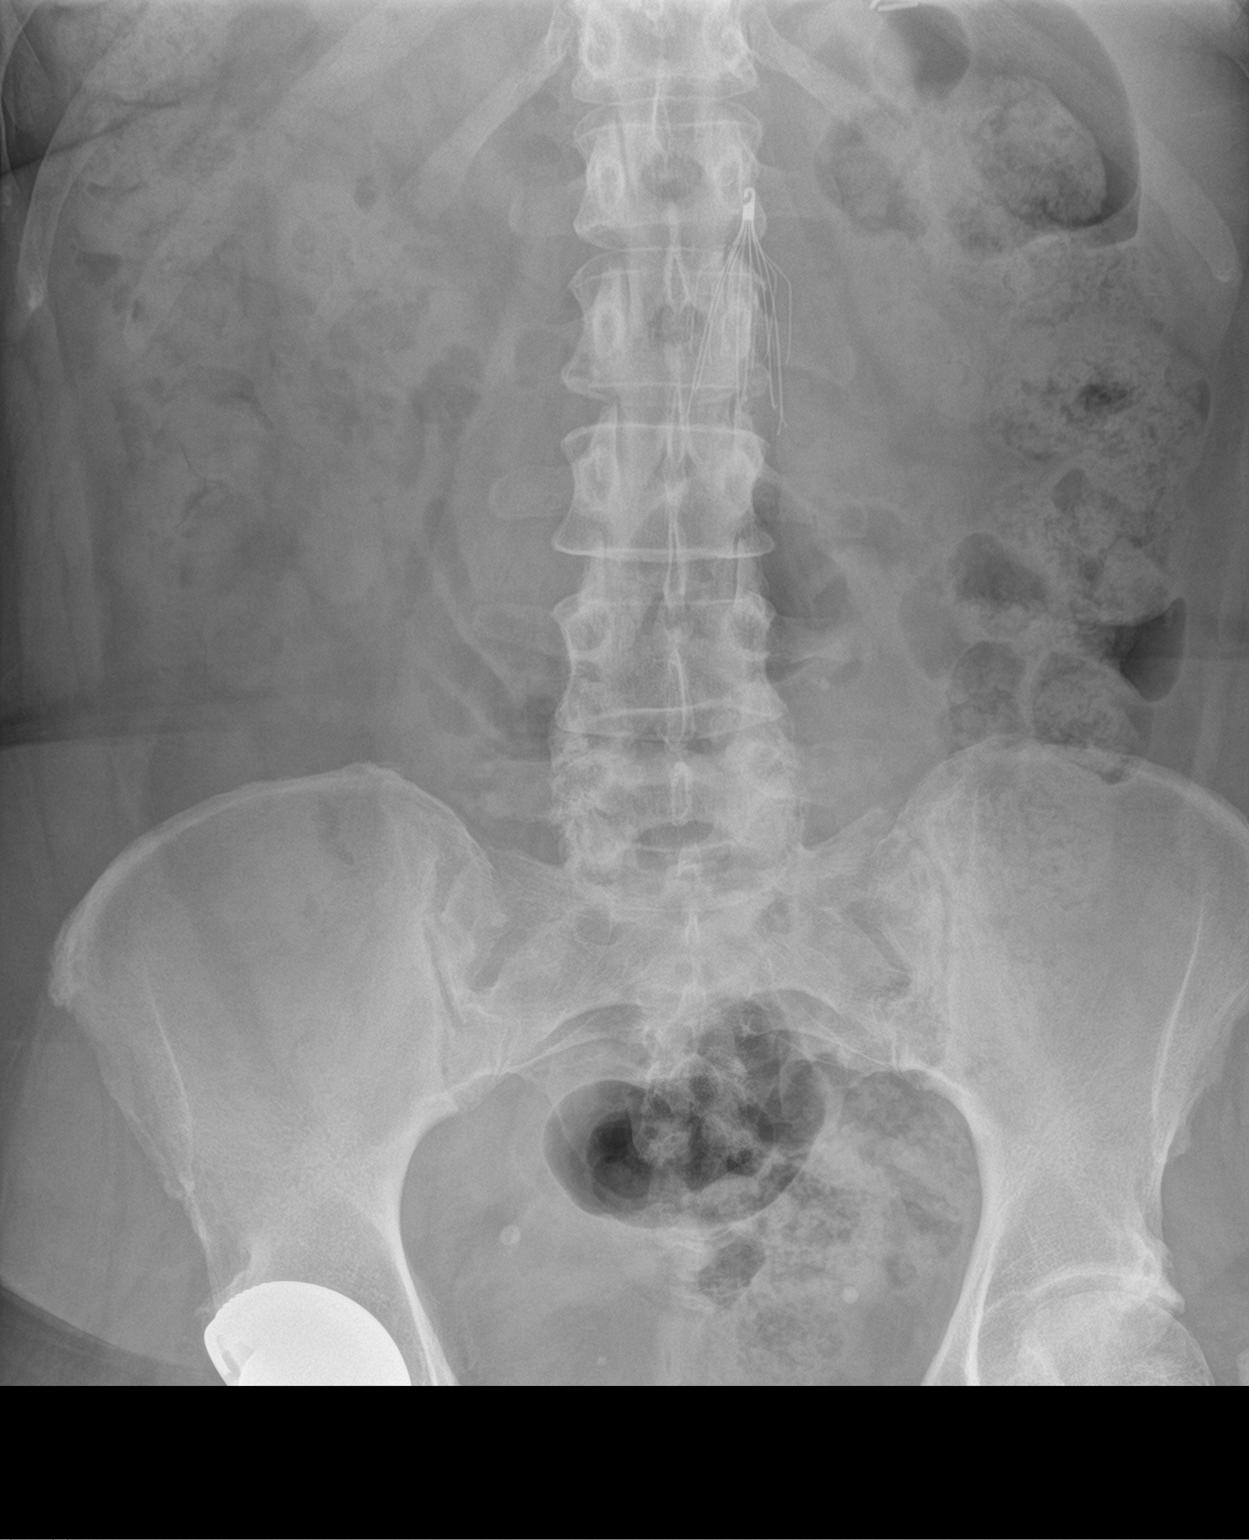

[2 of 2 positions shown; findings below may reference images not displayed]

FINDINGS: Prior cholecystectomy. IVC filter again noted, unchanged. Large
stool burden throughout the colon. No evidence of bowel obstruction,
free air organomegaly. Calcified phleboliths in the pelvis.
IMPRESSION: Large stool burden.  No acute findings.

## 2017-04-25 IMAGING — DX DG NECK SOFT TISSUE
2 series · 2 of 2 positions shown · non-contrast
Comparison: None.

CLINICAL DATA: Popping noise.  Dysphagia.

EXAM:
NECK SOFT TISSUES - 1+ VIEW

[neck lat]
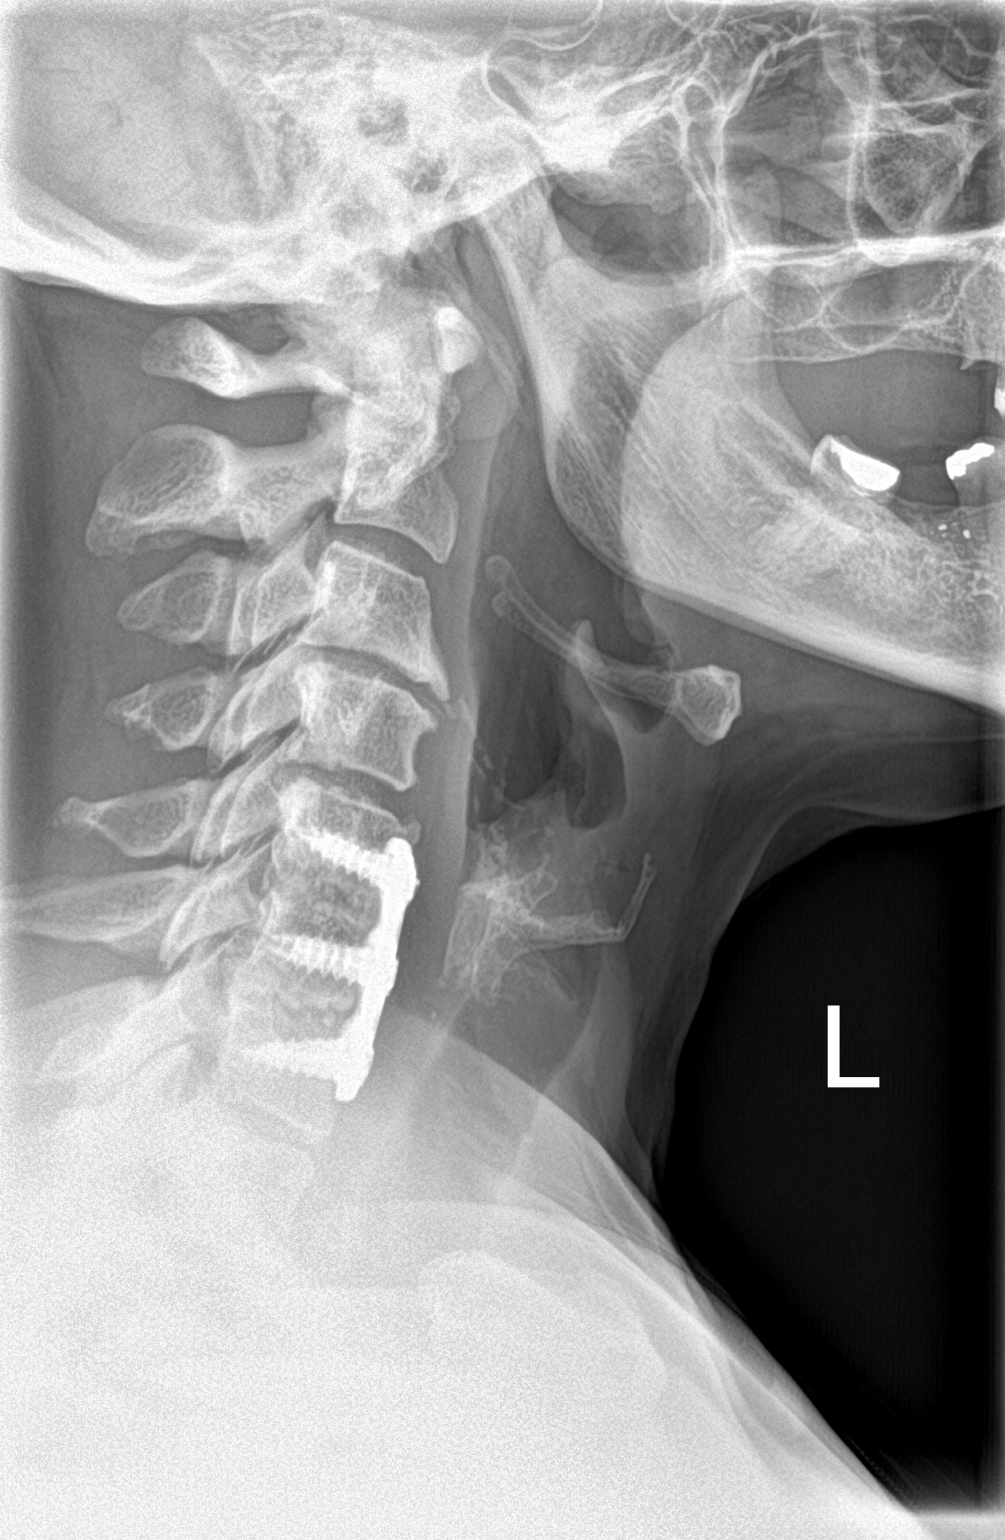

[neck ap]
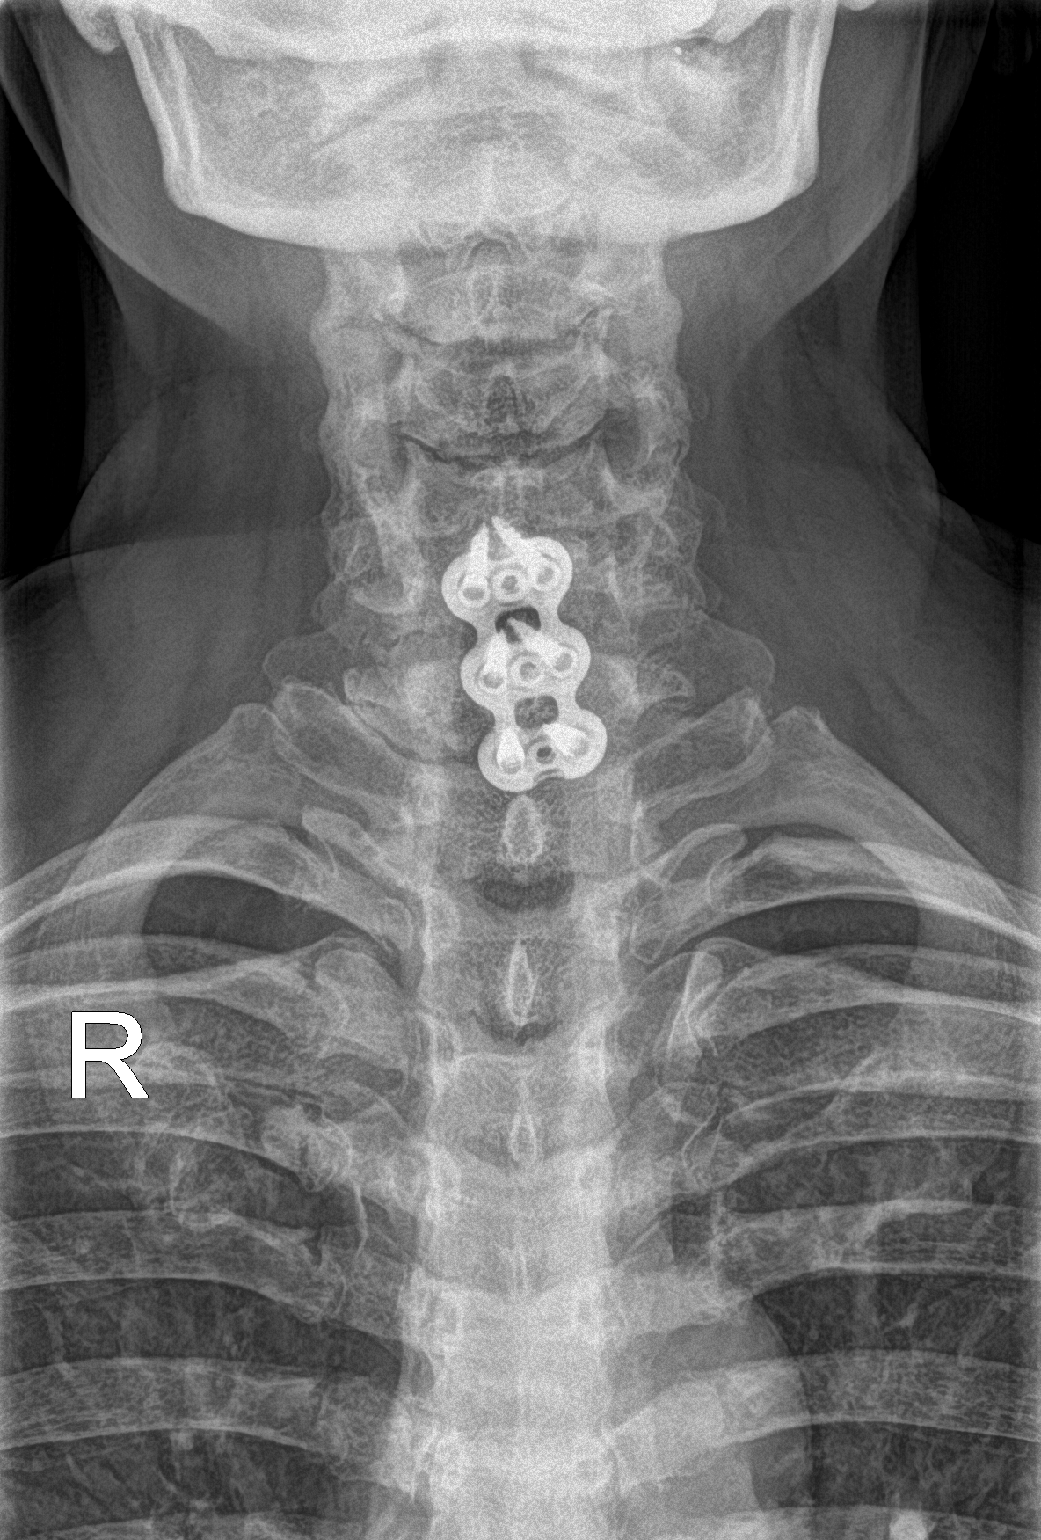

[2 of 2 positions shown; findings below may reference images not displayed]

FINDINGS: The cervical spine is fused anteriorly at C5-6 and C6-7.
Degenerative endplate changes are present at C3-4 and C4-5.
Uncovertebral spurring is present at both levels. The soft tissues
the neck are unremarkable. The epiglottis is within normal limits.
The airway is widely patent. The visualized skullbase is
unremarkable.
IMPRESSION: 1. No acute or focal lesion to explain the patient's symptoms.
2. Anterior cervical spine fusion at C5-6 and C6-7 appears solid.

## 2017-04-28 ENCOUNTER — Telehealth: Payer: Self-pay | Admitting: Gastroenterology

## 2017-04-28 NOTE — Telephone Encounter (Signed)
Patient called and has IBS and has been constipated and took laxaitive and has been vomiting before using the bathroom and is running a low grade fever. Please call patient.

## 2017-04-29 ENCOUNTER — Other Ambulatory Visit: Payer: Self-pay

## 2017-04-29 NOTE — Telephone Encounter (Signed)
Call x2  Pt states she feels better. Fever has gone done. No more emesis.  Advised patient to call us if she needs [place].

## 2017-05-05 ENCOUNTER — Other Ambulatory Visit: Payer: Self-pay | Admitting: Family Medicine

## 2017-05-08 ENCOUNTER — Other Ambulatory Visit: Payer: Self-pay | Admitting: Family Medicine

## 2017-05-08 DIAGNOSIS — Z760 Encounter for issue of repeat prescription: Secondary | ICD-10-CM

## 2017-05-11 ENCOUNTER — Other Ambulatory Visit: Payer: Self-pay | Admitting: Family Medicine

## 2017-05-13 ENCOUNTER — Other Ambulatory Visit: Payer: Self-pay | Admitting: Family Medicine

## 2017-05-13 NOTE — Telephone Encounter (Signed)
Pt called to follow up on her Rx. Thank you!

## 2017-05-14 IMAGING — DX DG HIP (WITH OR WITHOUT PELVIS) 2-3V*L*
3 series · 3 of 3 positions shown · non-contrast
Comparison: None.

CLINICAL DATA: Post fall, now with left hip pain.

EXAM:
DG HIP (WITH OR WITHOUT PELVIS) 2-3V LEFT

[pelvis ap]
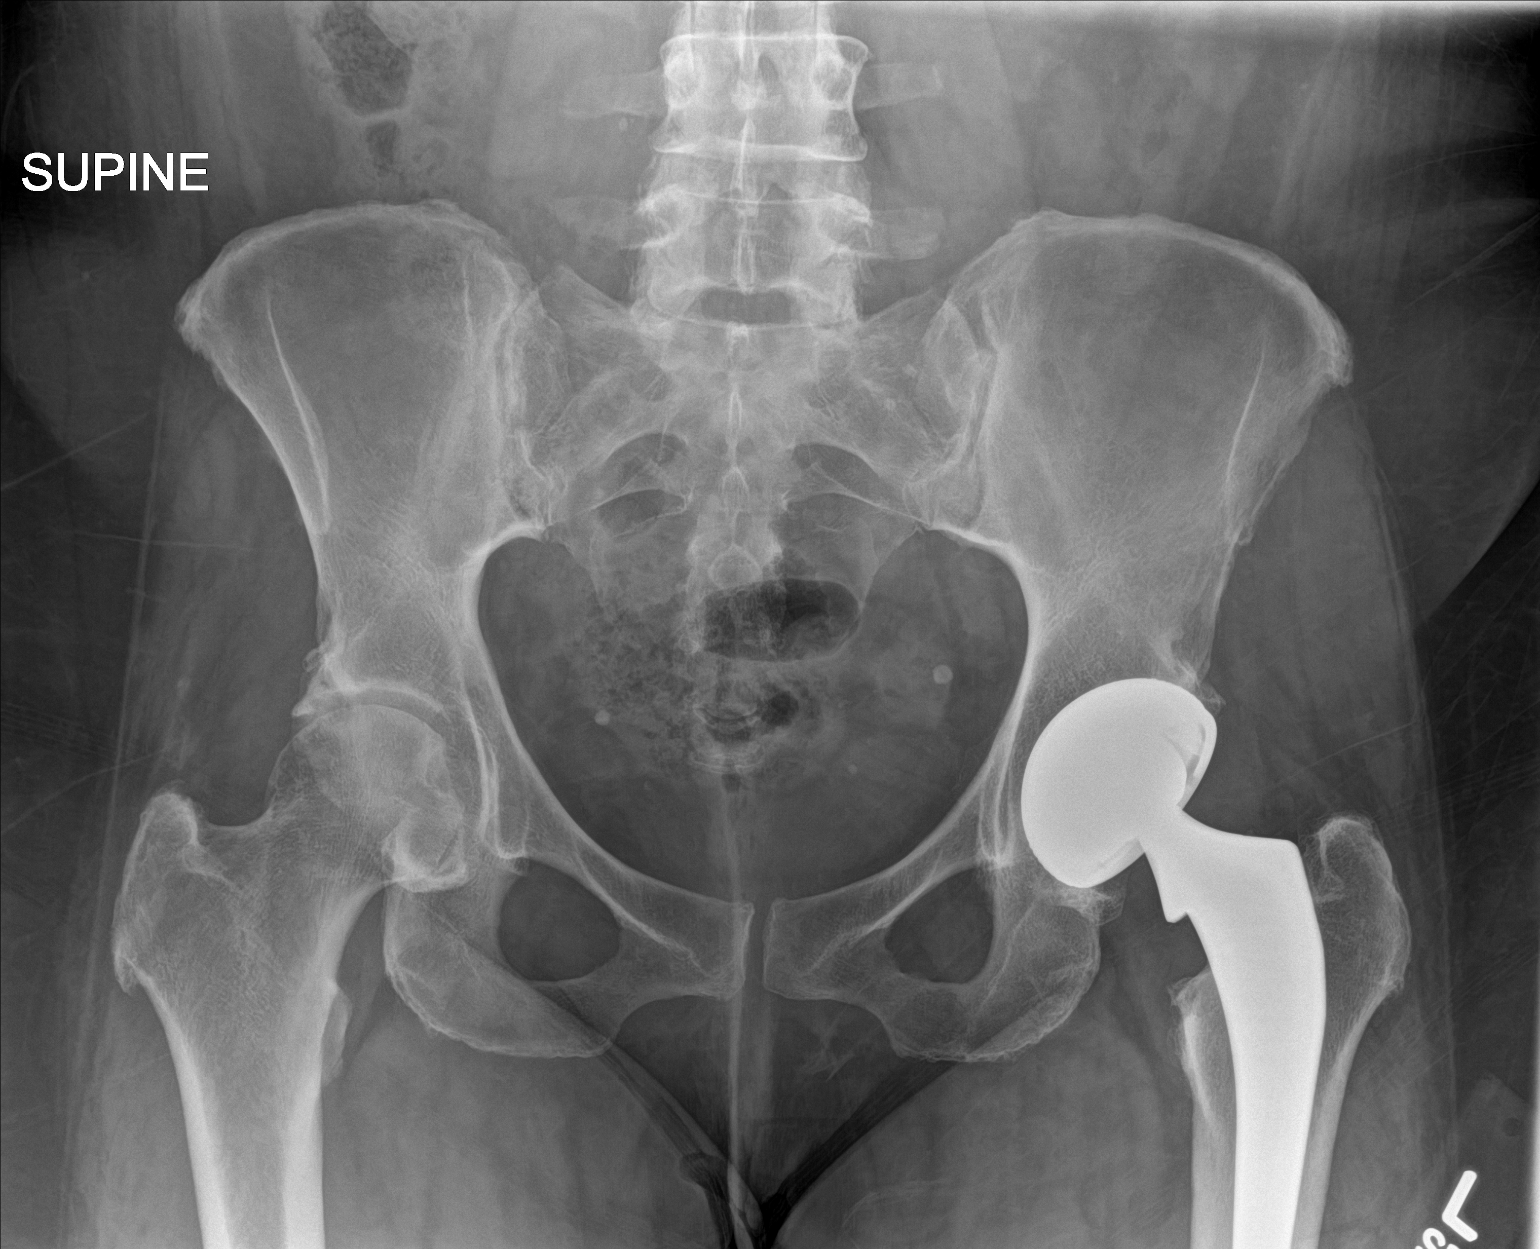

[hip ap]
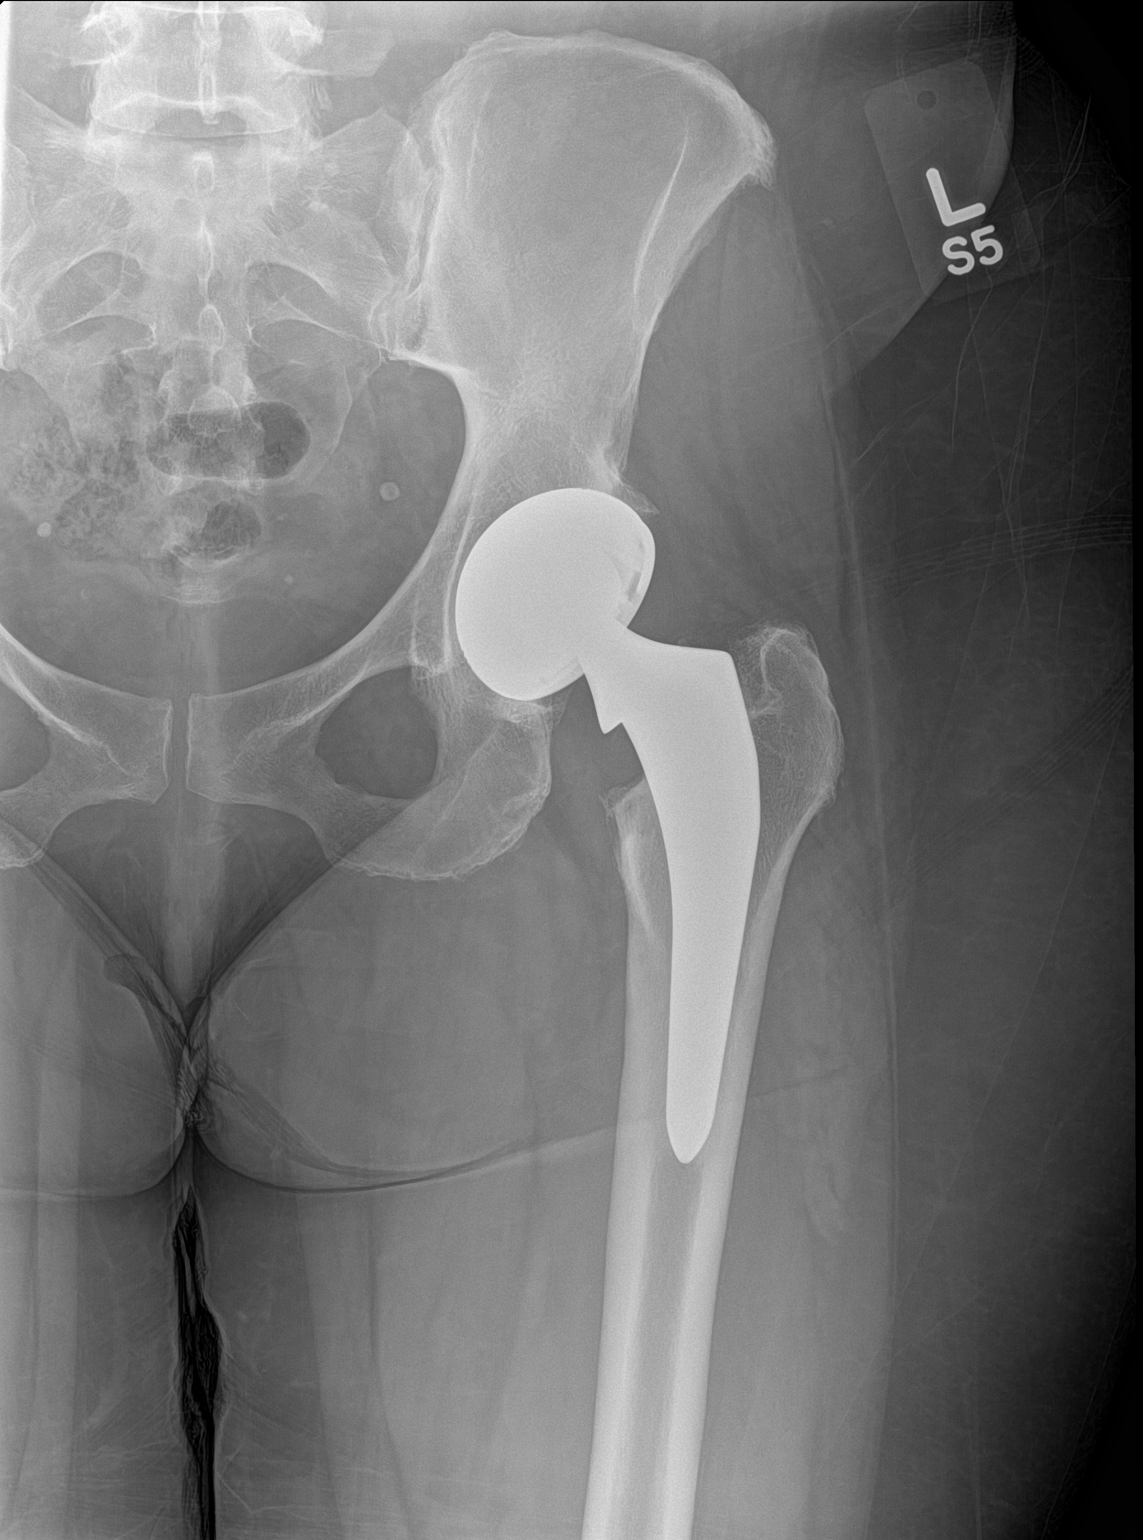

[hip lat]
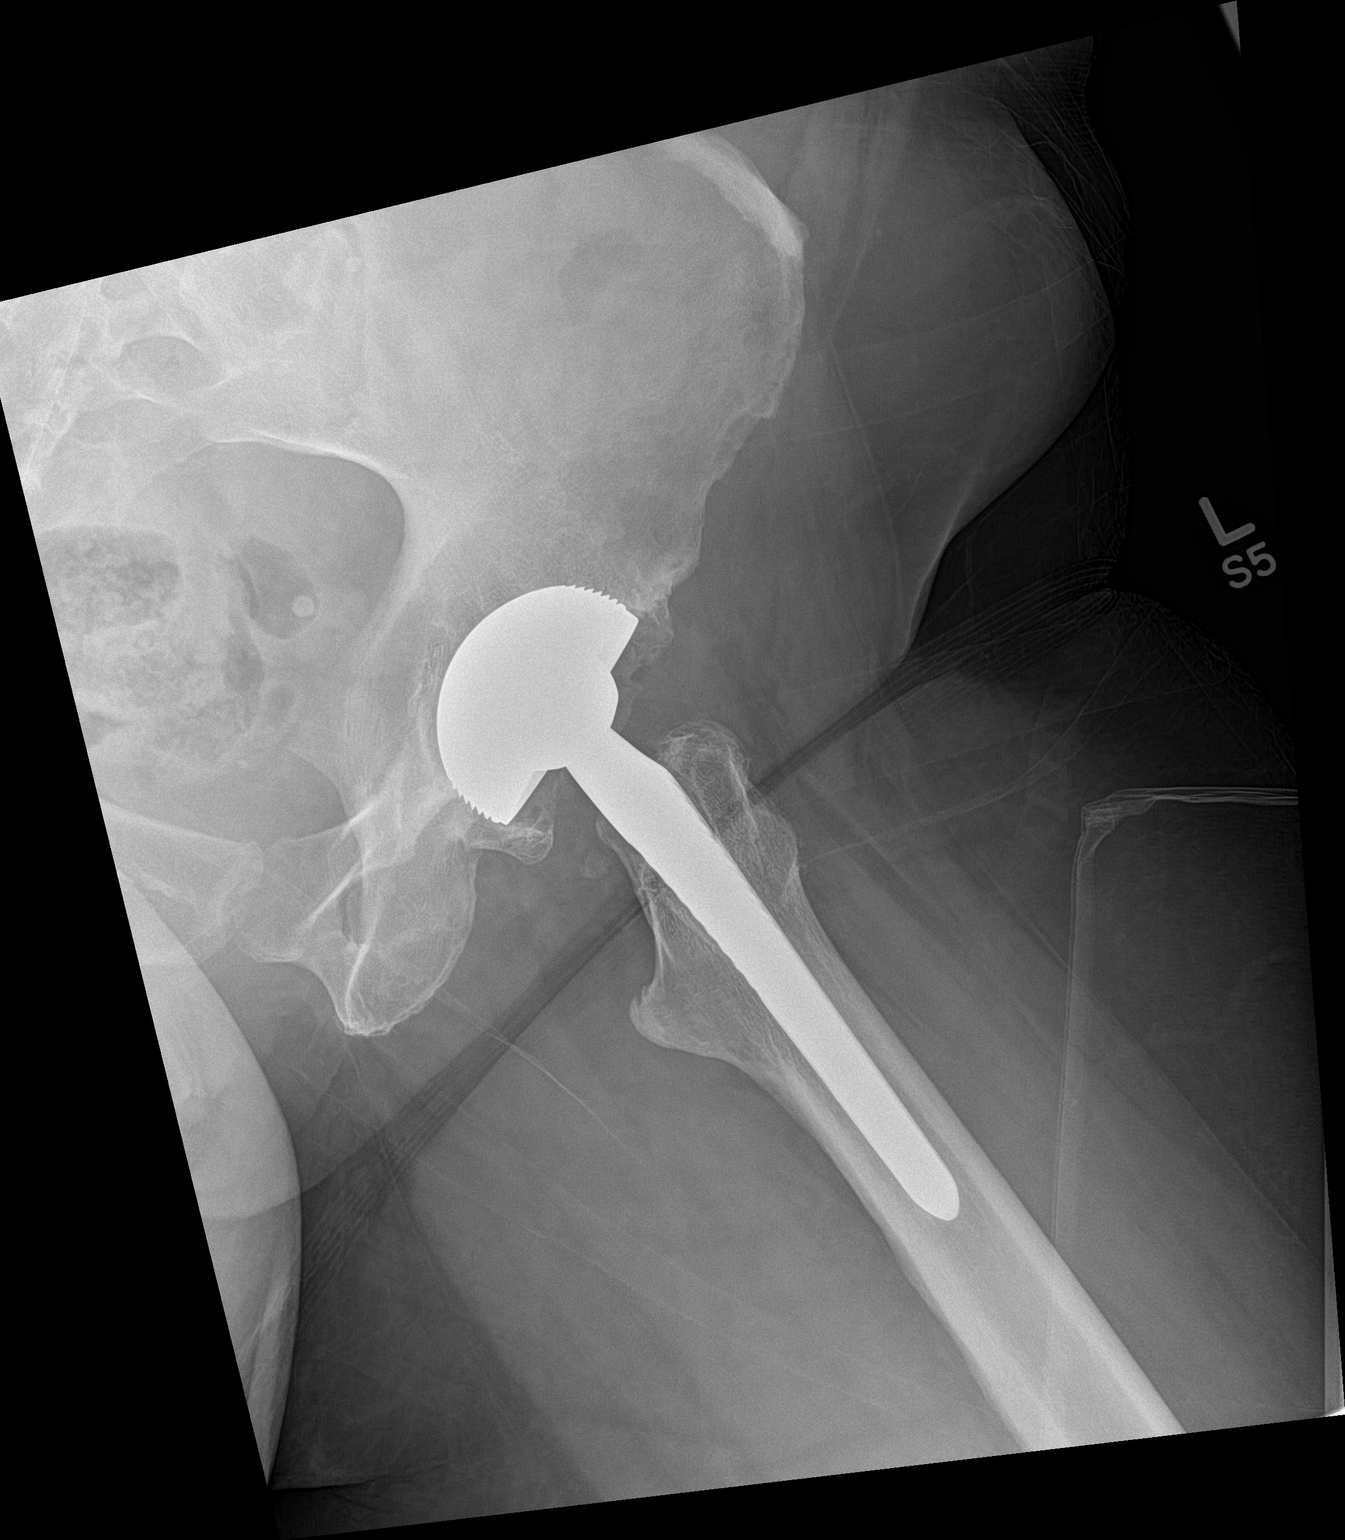

[3 of 3 positions shown; findings below may reference images not displayed]

FINDINGS: No fracture or dislocation. Post left total hip replacement without
evidence of hardware failure or loosening.

Limited visualization of the adjacent pelvis is normal. Suspected
mild degenerate change of the contralateral right hip with joint
space loss, subchondral sclerosis and osteophytosis. Several
phleboliths overlie the lower pelvis bilaterally. Regional soft
tissues appear otherwise normal.
IMPRESSION: 1. No acute findings.
2. Post left total hip replacement without evidence of hardware
failure or loosening.

## 2017-05-14 IMAGING — DX DG FOREARM 2V*L*
2 series · 2 of 2 positions shown · non-contrast
Comparison: None.

CLINICAL DATA: Fall with left forearm/wrist pain.

EXAM:
LEFT FOREARM - 2 VIEW

[forearm ap]
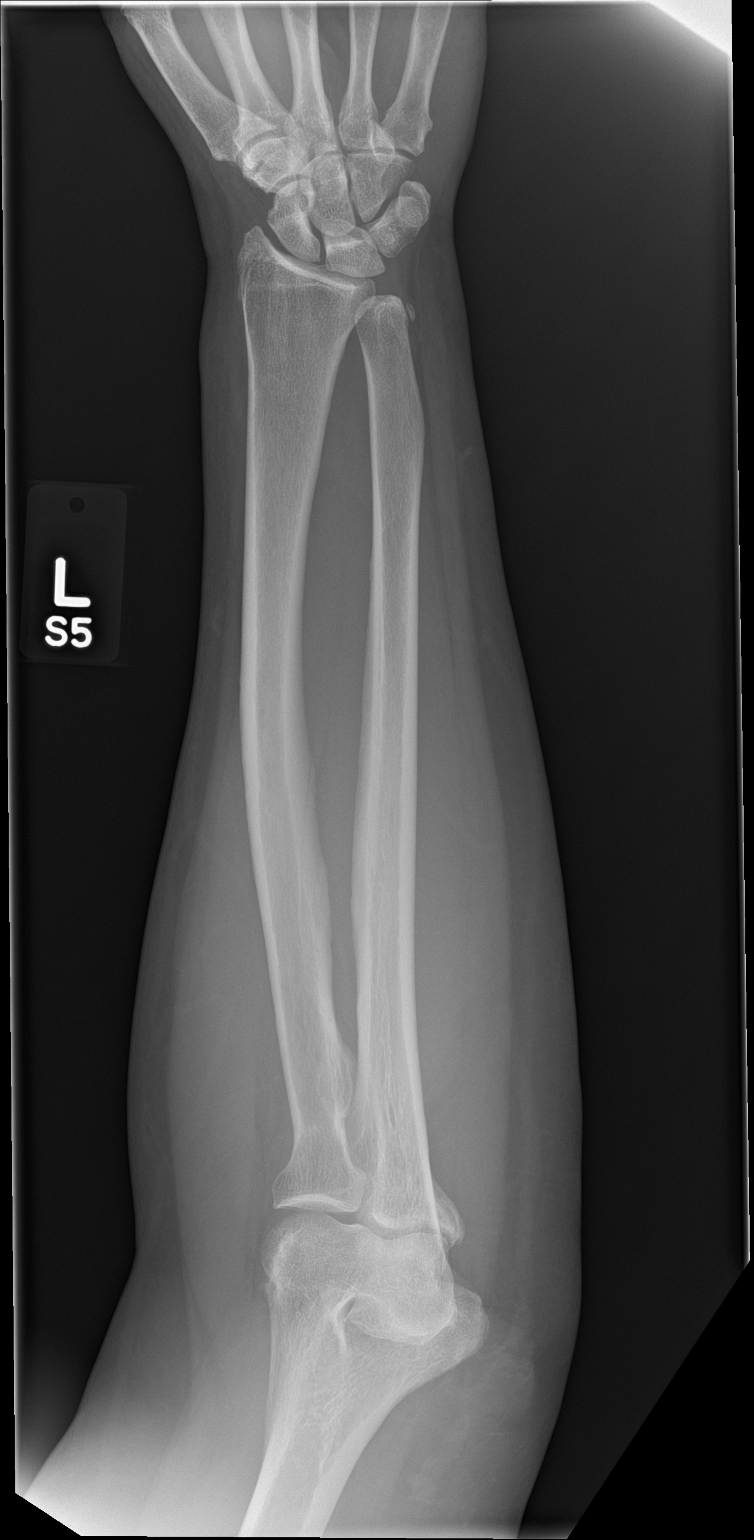

[forearm lat]
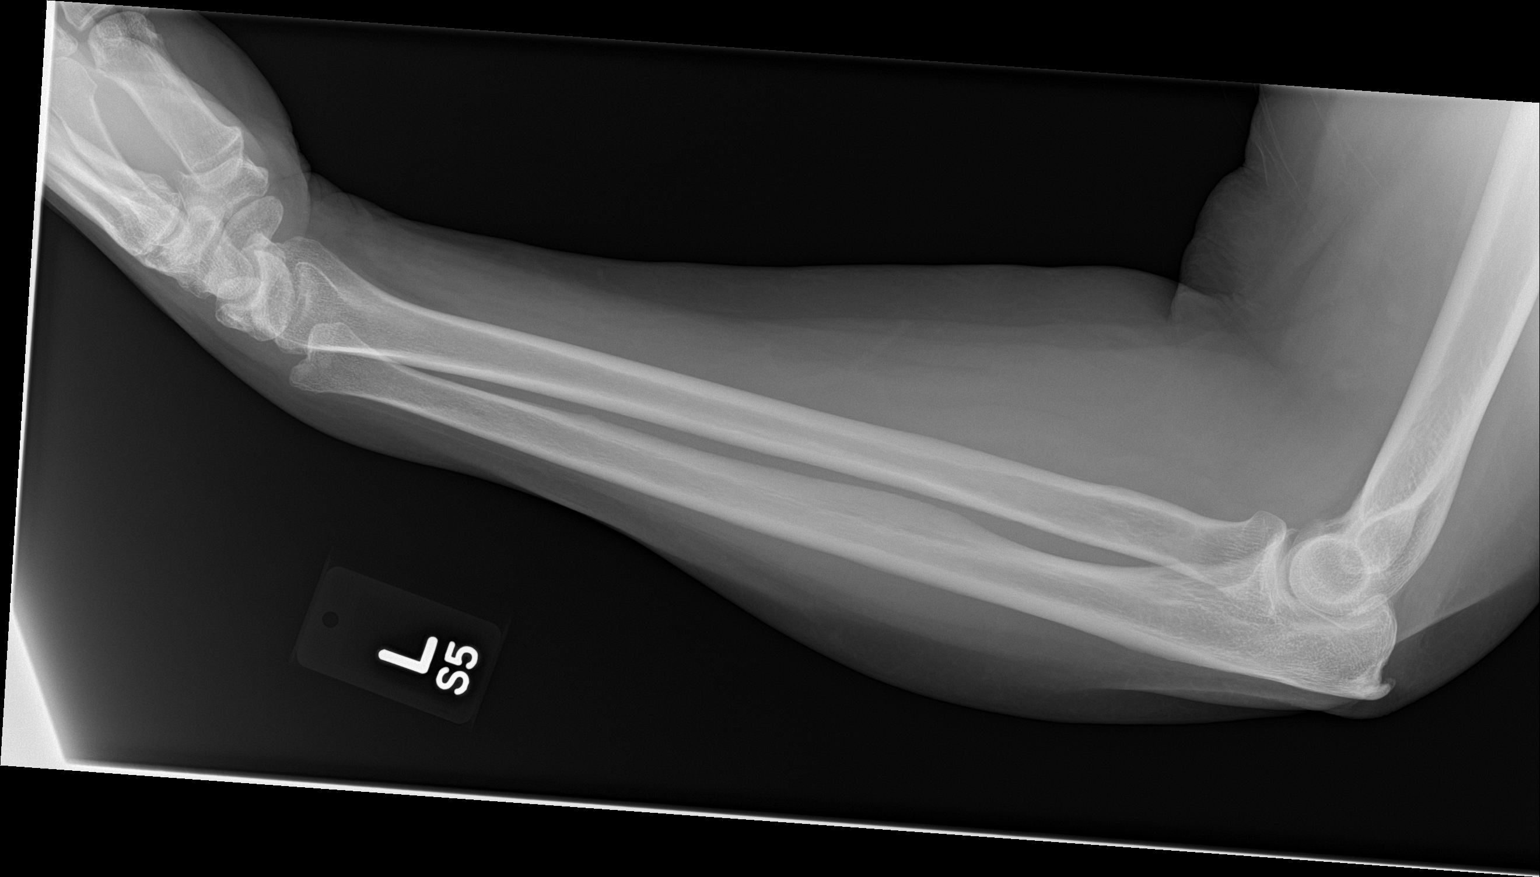

[2 of 2 positions shown; findings below may reference images not displayed]

FINDINGS: No fracture or suspicious focal osseous lesion is on these left
forearm views. Well corticated osseous fragment at the ulnar styloid
is likely a developmental ossicle or the sequela of remote injury.
Small spur is present at the left olecranon.
IMPRESSION: Negative.

## 2017-05-14 IMAGING — CT CT MAXILLOFACIAL W/O CM
3 of 5 series · 14 of 47 positions shown, 16 images · non-contrast
Comparison: None.

CLINICAL DATA: Post fall.

EXAM:
CT HEAD WITHOUT CONTRAST
CT MAXILLOFACIAL WITHOUT CONTRAST
TECHNIQUE: Multidetector CT imaging of the head and maxillofacial structures
were performed using the standard protocol without intravenous
contrast. Multiplanar CT image reconstructions of the maxillofacial
structures were also generated.

[Series 3: head bone · axial · 0.43mm/px · z∈[-106,+18]mm · 8 of 76 slices shown, 10 images]
[im 7/76  brain]
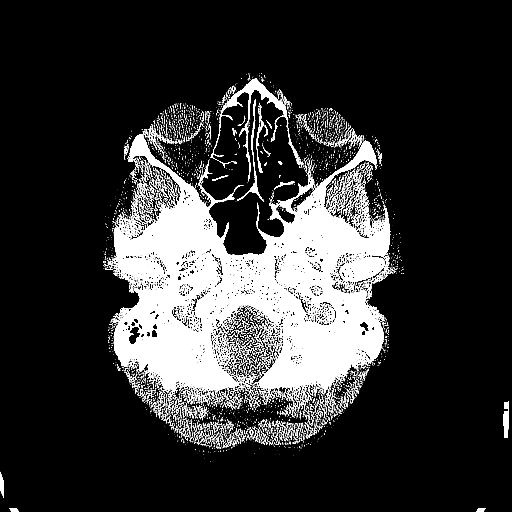
[im 7/76  bone]
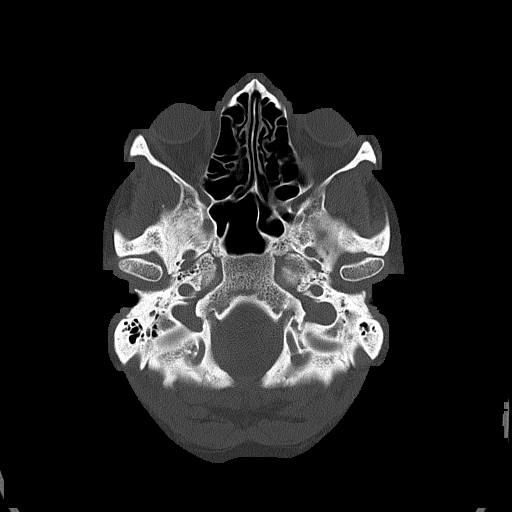
[im 19/76  bone]
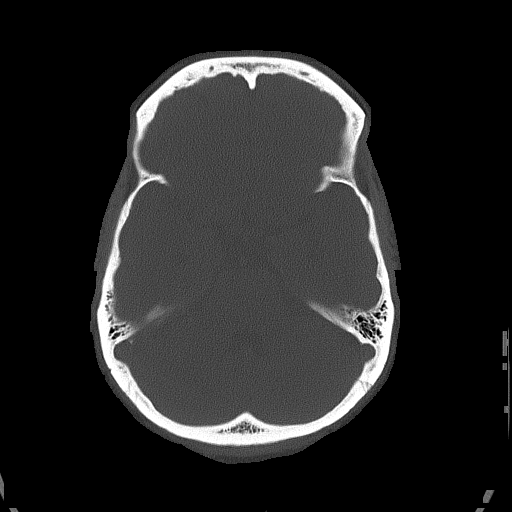
[im 26/76  bone]
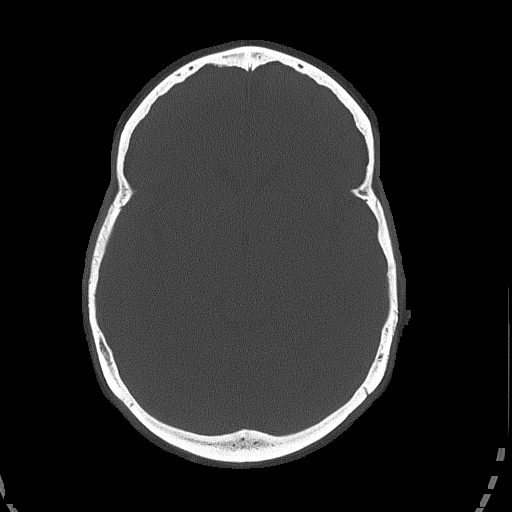
[im 32/76  bone]
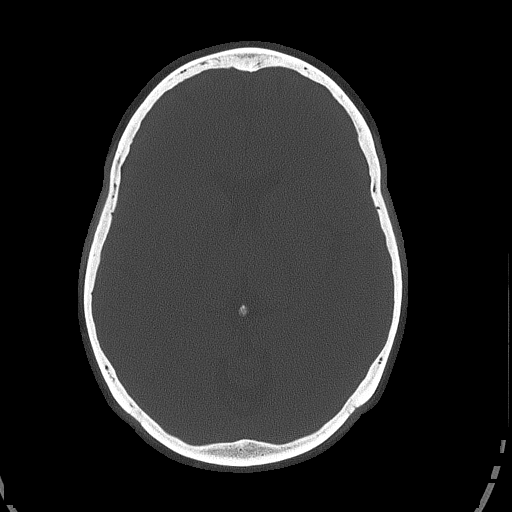
[im 44/76  brain]
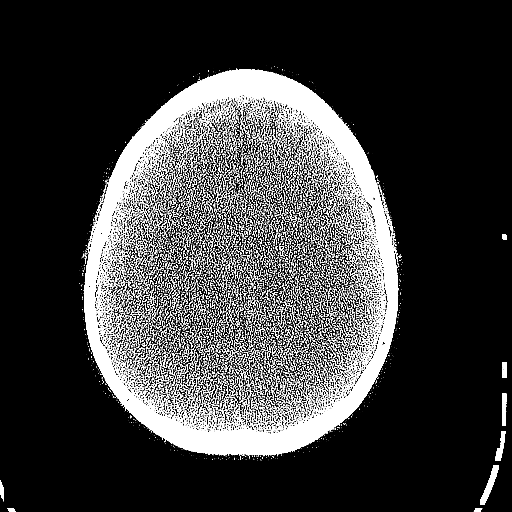
[im 44/76  bone]
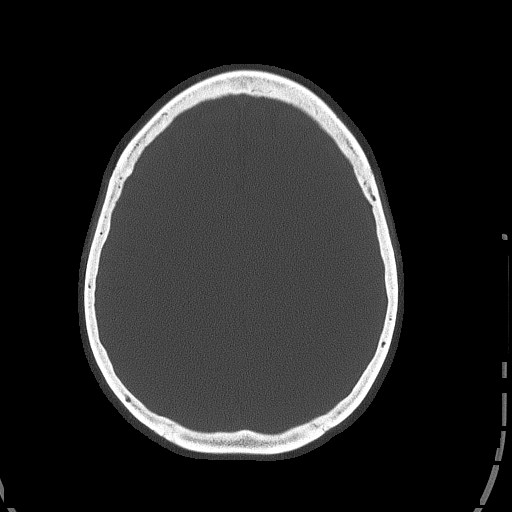
[im 51/76  bone]
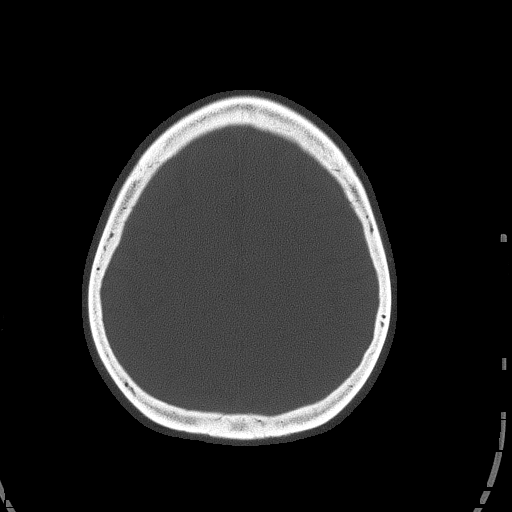
[im 57/76  bone]
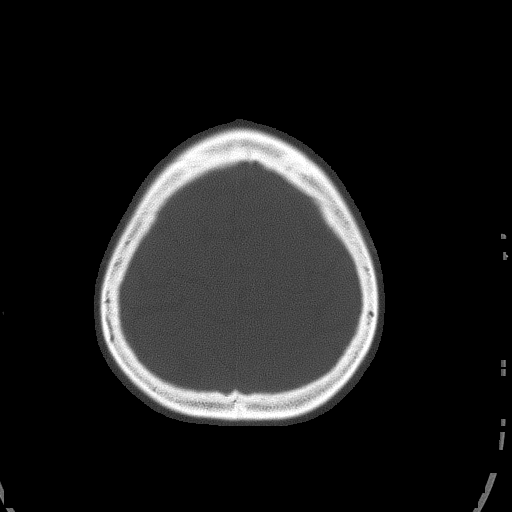
[im 69/76  bone]
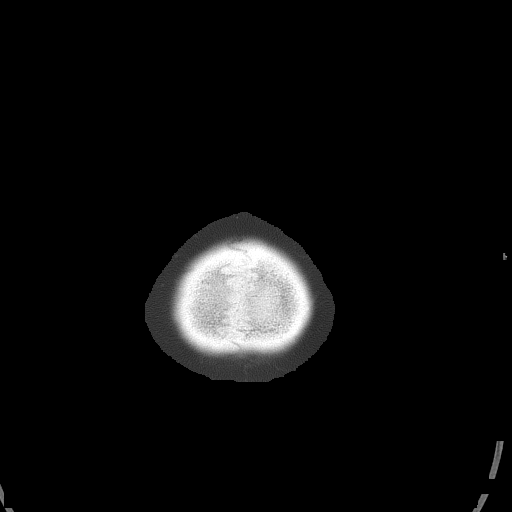

[Series 8: facialbone 2.0 cor st · coronal · 0.28mm/px · 3 of 67 slices shown]
[im 23/67  bone]
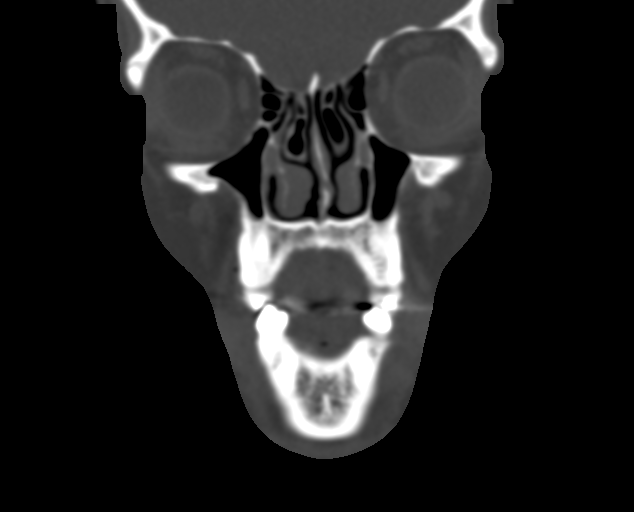
[im 30/67  bone]
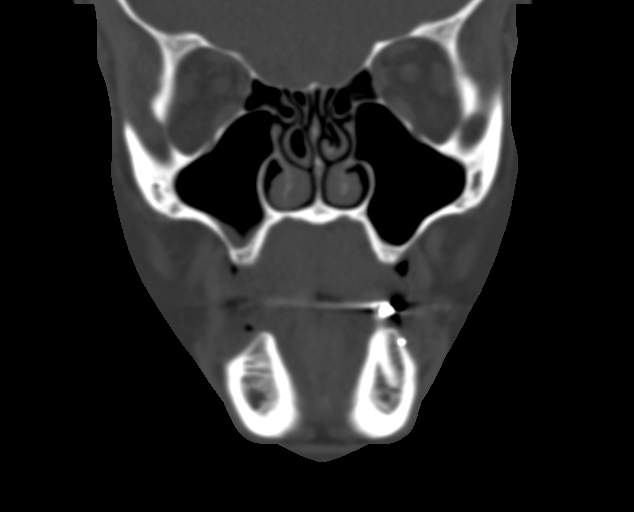
[im 37/67  bone]
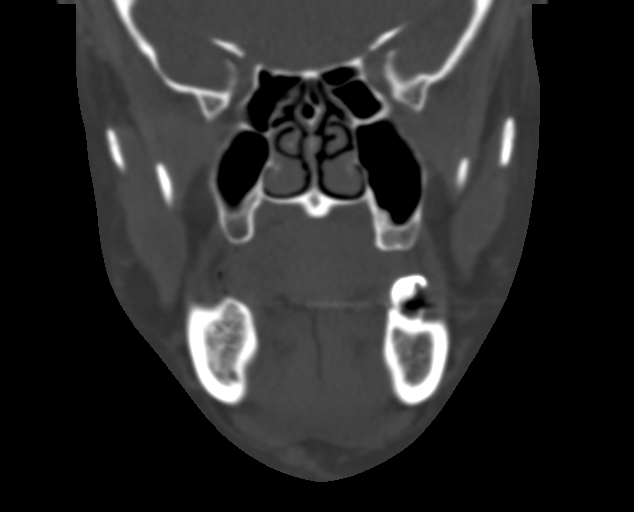

[Series 9: facialbone 2.0 sag st · sagittal · 0.30mm/px · 3 of 82 slices shown]
[im 28/82  bone]
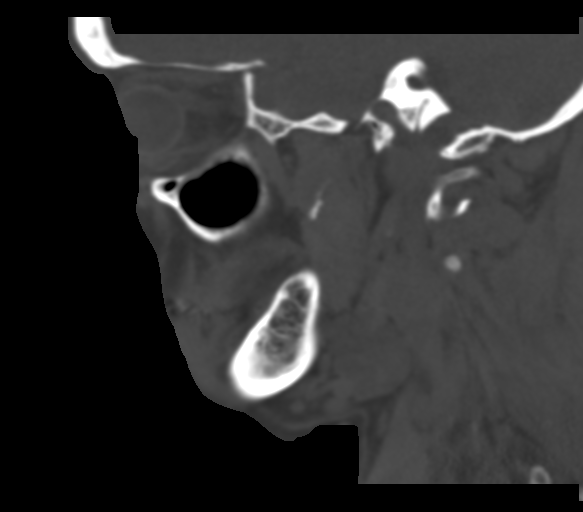
[im 41/82  bone]
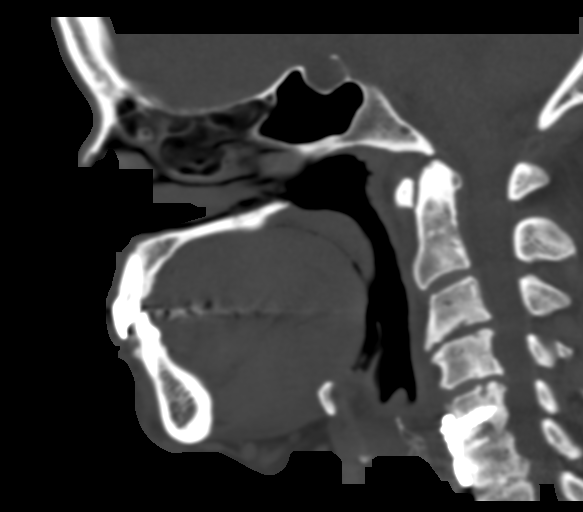
[im 55/82  bone]
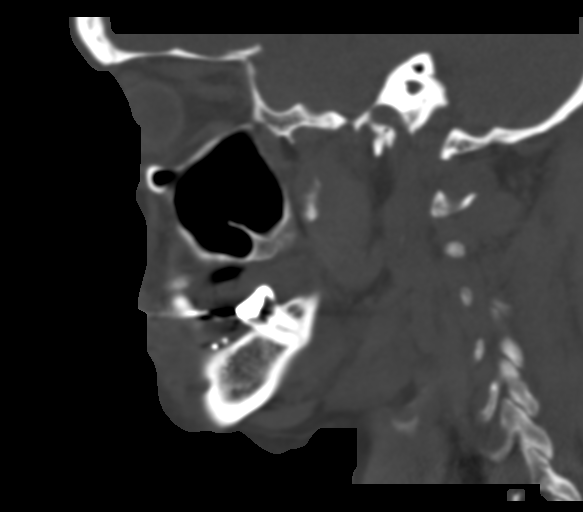

[14 of 47 positions shown; findings below may reference images not displayed]

FINDINGS: CT HEAD FINDINGS

The gray-white differentiation is maintained. No CT evidence acute
large territory infarct. No intraparenchymal or extra-axial mass or
hemorrhage. Normal size a configuration of the ventricles and
basilar cisterns. No midline shift. There is underpneumatization of
the right frontal sinus. The remaining paranasal sinuses and mastoid
air cells are normally aerated. No air-fluid levels. Regional soft
tissues appear normal. No displaced calvarial fracture.

CT MAXILLOFACIAL FINDINGS

No displaced facial fracture. Regional soft tissues appear normal.
No radiopaque foreign body.

Normal appearance of the bilateral pterygoid plates. Normal
appearance of the bilateral zygomatic arches. Normal appearance of
the mandible. Post mandibular condyles are normally located.

Normal noncontrast appearance of the bilateral orbits and globes.
Normal appearance of the bilateral lamina papyracea.

There is minimal polypoid mucosal thickening of the bilateral
maxillary sinuses, right greater than left. There is
underpneumatization of the right frontal sinus. The remaining
paranasal sinuses and mastoid air cells are normally aerated. No
air-fluid levels. There is mild leftward nasal septal deviation
measuring approximately 3 mm in diameter.

Limited visualization the superior aspect the cervical spine
demonstrates the sequela of C5 through at least C7 ACDF and
interbody fusion.

Mild to moderate DDD of C3-C4 and C4-C5 with disc space height loss,
endplate irregularity and sclerosis. The dens is normally positioned
and a lateral masses of C1. Normal atlantodental and atlantoaxial
articulations.
IMPRESSION: 1. Negative noncontrast head CT.
2. No displaced facial fracture.  No radiopaque foreign body.
3. Post C5 through (at least) C7 ACDF without evidence of hardware
failure or loosening through the imaged components.

## 2017-05-14 NOTE — Telephone Encounter (Signed)
Last OV 03/25/2017  Next OV not scheduled  Last refill 03/25/2017

## 2017-05-26 IMAGING — CR DG CHEST 2V
2 series · 2 of 2 positions shown · non-contrast
Comparison: Chest radiograph performed 05/24/2015

CLINICAL DATA: Acute onset of mid chest pain and shortness of
breath. Dizziness and dry cough. Initial encounter.

EXAM:
CHEST  2 VIEW

[chest lat]
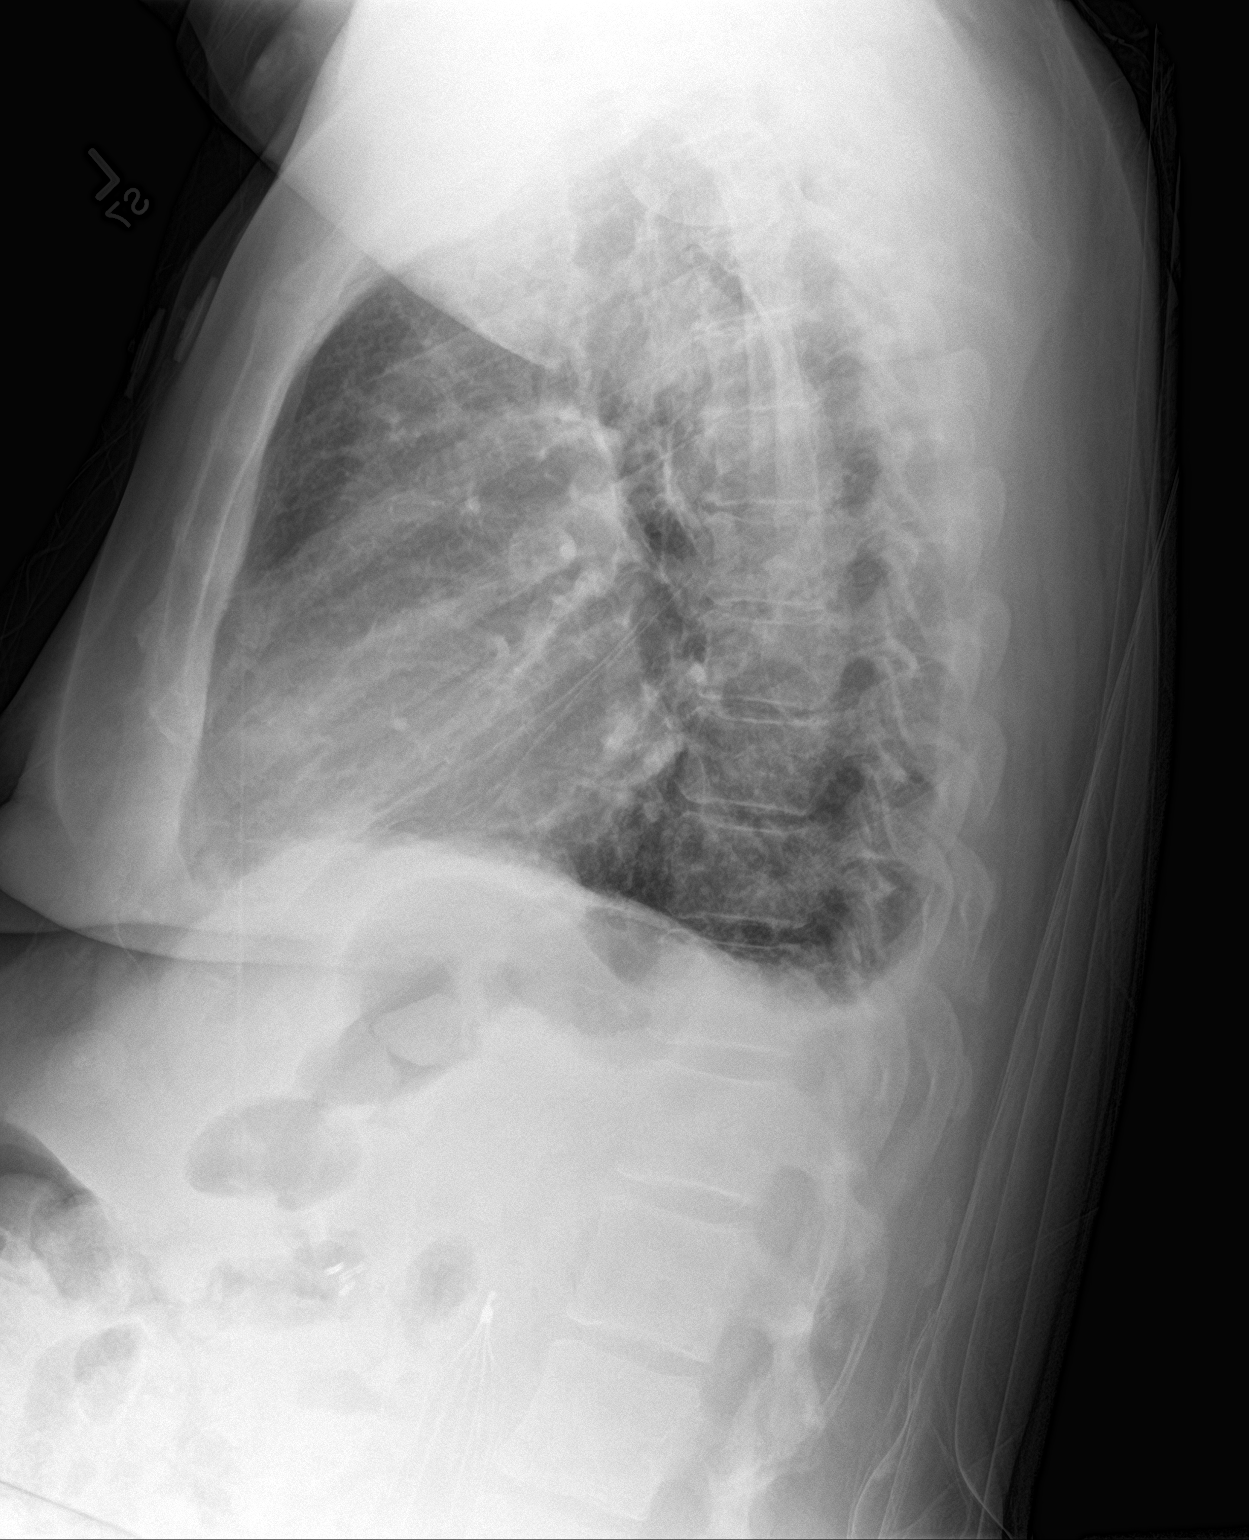

[chest ap]
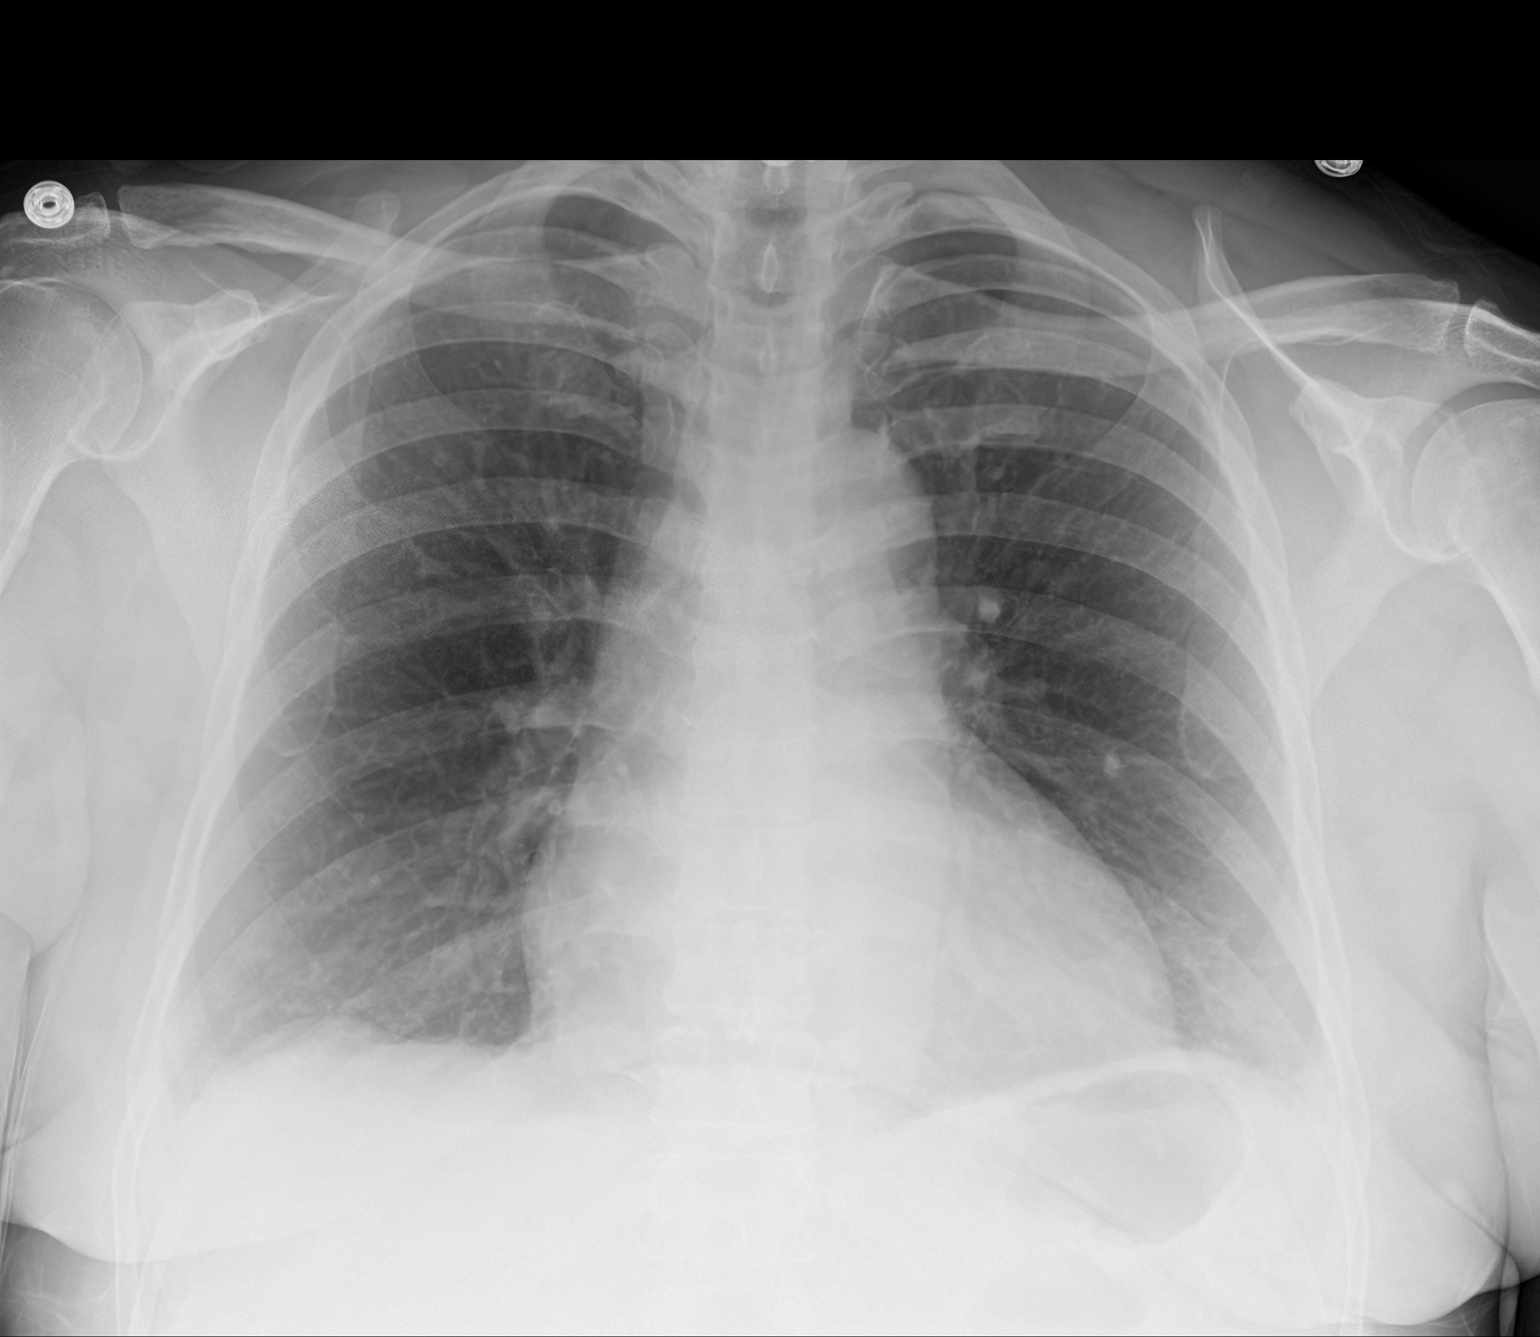

[2 of 2 positions shown; findings below may reference images not displayed]

FINDINGS: The lungs are well-aerated. Minimal bibasilar opacity may reflect
atelectasis or possibly mild pneumonia. Small bilateral pleural
effusions are suspected. There is no evidence of pneumothorax.

The heart is borderline normal in size. No acute osseous
abnormalities are seen. Cervical spinal fusion hardware is noted. An
IVC filter is seen. Clips are noted within the right upper quadrant,
reflecting prior cholecystectomy.
IMPRESSION: Minimal bibasilar opacity may reflect atelectasis or possibly mild
pneumonia. Small bilateral pleural effusions suspected.

## 2017-05-29 ENCOUNTER — Encounter: Payer: Self-pay | Admitting: Family Medicine

## 2017-05-29 ENCOUNTER — Ambulatory Visit (INDEPENDENT_AMBULATORY_CARE_PROVIDER_SITE_OTHER): Payer: Medicare Other | Admitting: Family Medicine

## 2017-05-29 VITALS — BP 118/70 | HR 93 | Temp 98.3°F | Wt 225.4 lb

## 2017-05-29 DIAGNOSIS — M542 Cervicalgia: Secondary | ICD-10-CM | POA: Diagnosis not present

## 2017-05-29 DIAGNOSIS — E119 Type 2 diabetes mellitus without complications: Secondary | ICD-10-CM | POA: Diagnosis not present

## 2017-05-29 DIAGNOSIS — F251 Schizoaffective disorder, depressive type: Secondary | ICD-10-CM | POA: Diagnosis not present

## 2017-05-29 DIAGNOSIS — I10 Essential (primary) hypertension: Secondary | ICD-10-CM

## 2017-05-29 DIAGNOSIS — J90 Pleural effusion, not elsewhere classified: Secondary | ICD-10-CM

## 2017-05-29 DIAGNOSIS — M940 Chondrocostal junction syndrome [Tietze]: Secondary | ICD-10-CM | POA: Diagnosis not present

## 2017-05-29 DIAGNOSIS — I201 Angina pectoris with documented spasm: Secondary | ICD-10-CM | POA: Diagnosis not present

## 2017-05-29 DIAGNOSIS — E1169 Type 2 diabetes mellitus with other specified complication: Secondary | ICD-10-CM | POA: Insufficient documentation

## 2017-05-29 DIAGNOSIS — R229 Localized swelling, mass and lump, unspecified: Secondary | ICD-10-CM | POA: Diagnosis not present

## 2017-05-29 DIAGNOSIS — D229 Melanocytic nevi, unspecified: Secondary | ICD-10-CM

## 2017-05-29 LAB — POCT GLYCOSYLATED HEMOGLOBIN (HGB A1C): Hemoglobin A1C: 7.9

## 2017-05-29 NOTE — Assessment & Plan Note (Signed)
Well controlled. Continue current medications  

## 2017-05-29 NOTE — Assessment & Plan Note (Addendum)
Refer to psychiatry. Well controlled currently. Continue medications.

## 2017-05-29 NOTE — Assessment & Plan Note (Signed)
Recheck A1c 

## 2017-05-29 NOTE — Assessment & Plan Note (Signed)
Chest pain caused by costochondritis. She'll monitor. Tylenol as needed.

## 2017-05-29 NOTE — Assessment & Plan Note (Signed)
Soft tissue prominence in her left lateral anterior neck that has been present since she had cervical spine surgery previously. It has been persistent. Could be scar tissue. We'll obtain an ultrasound to evaluate further.

## 2017-05-29 NOTE — Assessment & Plan Note (Signed)
Chronic neck pain related to DDD. Occasionally shooting down her arms. She has an appointment in the works to see a Psychologist, sport and exercise know she needs request records from her prior Psychologist, sport and exercise. We will fax records request to help facilitate this. Benign neurological exam in upper and lower extremities.

## 2017-05-29 NOTE — Patient Instructions (Signed)
Nice to see you. We will refer you to pulmonology, dermatology, and psychiatry. We'll get an ultrasound of your neck. We will call you with your A1c results.

## 2017-05-29 NOTE — Assessment & Plan Note (Signed)
Never followed up with pulmonology. Still present on recent CT angiogram. Refer back to pulmonology.

## 2017-05-29 NOTE — Progress Notes (Signed)
[name], MD Phone: [phone]  Valerie Vazquez is a 61 y.o. female who presents today for follow-up.  Anxiety/depression: Notes this is very well controlled. Taking Zyprexa and Lexapro. No SI or HI. She has not yet found a psychiatrist.  Hypertension: Not checking at home. Is taking her medications. Has no chest pressure. No shortness of breath. No edema.  She notes a occasional sharp centralized chest discomfort consistent with prior costochondritis. Will come on for several minutes at a time and go away on its own. No other associated symptoms. No exertional symptoms.  Patient previously evaluated by pulmonology for pleural effusions and dyspnea. It appears she was supposed to follow-up with them though did not. Recent CT angiogram with small bilateral pleural effusions.  Diabetes: No polyuria or polydipsia. Due for recheck of A1c.  Patient notes a mole on her left upper lip that has been present for the last 4-5 years. It has gotten larger. It is raised. It has not changed shape or color.  No chronic neck pain. Prior surgery. Occasionally gets pain shooting down her arms. Occasional numbness in the ulnar aspect of her elbow bilaterally. No weakness. She takes Tylenol and gabapentin. She notes that the lump in her left aspect of her neck since she had the surgery. It is chronically occasionally tender. It has been there for many years. It has gone down a little bit since the surgery.  GHW:EXHBZJ smoker   ROS see history of present illness  Objective  Physical Exam Vitals:   05/29/17 1421  BP: 118/70  Pulse: 93  Temp: 98.3 F (36.8 C)  SpO2: 96%    BP Readings from Last 3 Encounters:  05/29/17 118/70  03/28/17 (!) 147/81  03/25/17 120/84   Wt Readings from Last 3 Encounters:  05/29/17 225 lb 6.4 oz (102.2 kg)  03/28/17 223 lb (101.2 kg)  03/25/17 223 lb 9.6 oz (101.4 kg)    Physical Exam  Constitutional: No distress.  HENT:  Head: Normocephalic and  atraumatic.  Neck: Neck supple.  Slight soft tissue prominence of the left anterior aspect of her neck, no palpable underlying defects, no discrete mass, minimal tenderness, no overlying skin changes  Cardiovascular: Normal rate, regular rhythm and normal heart sounds.   Pulmonary/Chest: Effort normal and breath sounds normal.  Musculoskeletal: She exhibits no edema.  Neurological: She is alert.  5/5 strength in bilateral biceps, triceps, grip, quads, hamstrings, plantar and dorsiflexion, sensation to light touch intact in bilateral UE and LE  Skin: Skin is warm and dry. She is not diaphoretic.  Psychiatric: Mood and affect normal.  Small nevus with regular borders and uniform color that is raised on left upper lip costochondral tenderness noted on exam No midline neck tenderness, no midline neck step-off, no muscular neck tenderness  Assessment/Plan: Please see individual problem list.  Essential hypertension Well-controlled. Continue current medications.  Pleural effusion, bilateral Never followed up with pulmonology. Still present on recent CT angiogram. Refer back to pulmonology.  Costochondritis Chest pain caused by costochondritis. She'll monitor. Tylenol as needed.  Schizoaffective disorder, depressive type ([place]) Refer to psychiatry. Well controlled currently. Continue medications.  Neck pain Chronic neck pain related to DDD. Occasionally shooting down her arms. She has an appointment in the works to see a Psychologist, sport and exercise know she needs request records from her prior Psychologist, sport and exercise. We will fax records request to help facilitate this. Benign neurological exam in upper and lower extremities.  Soft tissue swelling Soft tissue prominence in her left lateral anterior neck  that has been present since she had cervical spine surgery previously. It has been persistent. Could be scar tissue. We'll obtain an ultrasound to evaluate further.  Diabetes mellitus without complication (HCC) Recheck  A1c.   Orders Placed This Encounter  Procedures  . US SOFT TISSUE HEAD & NECK (NON-THYROID)    Standing Status:   Future    Standing Expiration Date:   07/29/2018    Order Specific Question:   Reason for Exam (SYMPTOM  OR DIAGNOSIS REQUIRED)    Answer:   soft tissue prominence left anterior neck, slight tenderness    Order Specific Question:   Preferred imaging location?    Answer:   [facility]  . Ambulatory referral to Pulmonology    Referral Priority:   Routine    Referral Type:   Consultation    Referral Reason:   Specialty Services Required    Requested Specialty:   Pulmonary Disease    Number of Visits Requested:   1  . Ambulatory referral to Dermatology    Referral Priority:   Routine    Referral Type:   Consultation    Referral Reason:   Specialty Services Required    Requested Specialty:   Dermatology    Number of Visits Requested:   1  . Ambulatory referral to Psychiatry    Referral Priority:   Routine    Referral Type:   Psychiatric    Referral Reason:   Specialty Services Required    Requested Specialty:   Psychiatry    Number of Visits Requested:   1  . POCT HgB A1C     [name], MD [address]

## 2017-06-02 ENCOUNTER — Telehealth: Payer: Self-pay | Admitting: *Deleted

## 2017-06-02 NOTE — Telephone Encounter (Signed)
Pt requested lab results  Pt contact [phone] Pt continues to have back pain -lower and upper, and requested suggestions from Dr. Caryl Bis

## 2017-06-03 NOTE — Progress Notes (Signed)
PULMONARY CONSULT NOTE  Requesting MD/Service: Valerie Lime, MD Date of initial consultation: 10/02/15 Reason for consultation: cough, dyspnea  PT PROFILE: 61 y.o. F with PMH of recurrent PE, prior PNA, small bilateral pleural effusions on recurrent CXRs referred for evaluation of chronic cough and DOE  HPI:  61 y.o. F last saw Dr. Alva Vazquez here January 2017  with PMH of recurrent PE, prior PNA, small bilateral pleural effusions on recurrent CXRs referred for evaluation of chronic cough and DOE.   At her last visit, it was noted that she had recurrent PE, on lifelong Eliquis, rhonchi, cough, thought to be secondary to sinus drainage, deconditioning and possible diastolic congestive heart failure, airways disease appeared to be doubtful. She was encouraged to increase exercise level, sent for BMP, ESR, and a, trial of Nasacort nasal spray. These were not done. She remains on eliquis  She is referred back here today due to Ct chest which showed pleural effusion. She denies dyspnea on exertion. She denies the cough that she was originally seen for. She notes that she will occasionally wheeze at night. She does have reflux and takes protonix daily. She often eats before bedtime. She has lasix which she takes less than once per week for ankle swelling.  She tells me that she has been diagnosed with OSA, she was told to lose weight, this was about 1 year ago after a sleep study.   Echo 09/29/15 2017; LV EF: 60% -   65% Ct scan 02/23/17; images personally reviewed, there were small bilateral pleural effusions, enlarged pulmonary veins, both indicative of mild pulmonary venous hypertension, with volume overload.  Addendum:  Review of outside sleep study performed on 01/11/15, the AHI was 0.5, which increased to as high as 15.7, when the patient was supine and REM sleep. However, overall the test was negative for obstructive sleep apnea.    Past Medical History:  Diagnosis Date  . Anemia   .  Bilateral renal cysts   . Chronic back pain    "upper and lower" (09/19/2014)  . Depression    "major" (09/19/2014)  . DVT (deep venous thrombosis) ([place])   . Esophageal spasm   . Gallstones   . Gastritis   . Gastroenteritis   . GERD (gastroesophageal reflux disease)   . Headache    "couple /month lately" (09/19/2014)  . High blood pressure   . High cholesterol   . History of blood transfusion 1985   related to hysterectomy  . Hypertension   . IBS (irritable bowel syndrome)   . Migraine    "a few times/yr" (09/19/2014)  . Myocardial infarction ([place]) 10/2010 X 3   "while hospitalized"  . Osteoarthritis    "qwhere; mostly around my joints" (09/19/2014)  . Pneumonia 04/2014  . PTSD (post-traumatic stress disorder)   . Pulmonary embolism ([place]) 04/2001; 09/19/2014   "after gallbladder OR; "  . Schizoaffective disorder ([place])   . Sleep apnea    "mild" (09/19/2014)  . Snoring    a. sleep study 5/16: No OSA  . Spinal stenosis   . Spondylosis   . Type II diabetes mellitus ([place])    "dx'd in 2007; lost weight; no RX for ~ 4 yr now" (09/19/2014)    Past Surgical History:  Procedure Laterality Date  . ABDOMINAL HYSTERECTOMY  1985  . ANTERIOR CERVICAL DECOMP/DISCECTOMY FUSION  10/2012  . APPENDECTOMY  1985  . BACK SURGERY    . BREAST CYST EXCISION Right   . BREAST EXCISIONAL BIOPSY Right YRS  AGO   NEG  . CARDIAC CATHETERIZATION   2000's; 12-Nov-2007  . CHOLECYSTECTOMY  2000/11/11  . COLONOSCOPY WITH PROPOFOL N/A 12/12/2016   Procedure: COLONOSCOPY WITH PROPOFOL;  Surgeon: Valerie Bellows, MD;  Location[facility] ENDOSCOPY;  Service: Endoscopy;  Laterality: N/A;  . COLONOSCOPY WITH PROPOFOL N/A 02/17/2017   Procedure: COLONOSCOPY WITH PROPOFOL;  Surgeon: Valerie Bellows, MD;  Location: [facility] ENDOSCOPY;  Service: Gastroenterology;  Laterality: N/A;  . ESOPHAGOGASTRODUODENOSCOPY (EGD) WITH PROPOFOL N/A 02/17/2017   Procedure: ESOPHAGOGASTRODUODENOSCOPY (EGD) WITH PROPOFOL;  Surgeon: Valerie Bellows, MD;  Location: [facility]  ENDOSCOPY;  Service: Gastroenterology;  Laterality: N/A;  . EXCISION/RELEASE BURSA HIP Right 12/1984  . HERNIA REPAIR  1985  . LEFT HEART CATHETERIZATION WITH CORONARY ANGIOGRAM N/A 12/09/2014   Procedure: LEFT HEART CATHETERIZATION WITH CORONARY ANGIOGRAM;  Surgeon: Valerie Blanks, MD;  Location: [facility] CATH LAB;  Service: Cardiovascular;  Laterality: N/A;  . TONSILLECTOMY AND ADENOIDECTOMY  ~ 1966/11/11  . TOTAL HIP ARTHROPLASTY Left 04-06-2014  . UMBILICAL HERNIA REPAIR  1985  . VENA CAVA FILTER PLACEMENT  03/2014    MEDICATIONS: I have reviewed all medications and confirmed regimen as documented  Social History   Social History  . Marital status: Divorced    Spouse name: N/A  . Number of children: N/A  . Years of education: N/A   Occupational History  . Not on file.   Social History Main Topics  . Smoking status: Former Smoker    Packs/day: 1.50    Years: 10.00    Types: Cigarettes    Quit date: 04/12/1979  . Smokeless tobacco: Never Used  . Alcohol use No  . Drug use: No  . Sexual activity: Not Currently   Other Topics Concern  . Not on file   Social History Narrative   Lives with fiancee in an apartment on the first floor.  Has 3 grown children.  One child passed away.  On disability November 11, 1994 - 2000, 2007 - current.     Education: some college.    Family History  Problem Relation Age of Onset  . Stroke Mother        Deceased  . Lung cancer Father        Deceased  . Healthy Daughter   . Healthy Son        x 2  . Other Son        Suicide  . Heart attack Neg Hx     ROS: No fever, myalgias/arthralgias, unexplained weight loss or weight gain No new focal weakness or sensory deficits No otalgia, hearing loss, visual changes, mouth and throat problems No neck pain or adenopathy No abdominal pain, N/V/D, diarrhea, change in bowel pattern No dysuria, change in urinary pattern   Vitals:   06/04/17 0924 06/04/17 0927  BP:  134/76  Pulse:  89  Resp:  16  SpO2:   100%  Weight:  223 lb (101.2 kg)  Height: _0  (1.676 m)      EXAM:  Gen: WDWN, Pleasant, No distress HEENT: NCAT, TMs and canals normal, sclera white, nares and nasal mucosa normal, oropharynx normal Neck: Supple without LAN, thyromegaly, JVD Lungs: breath sounds full, percussion note normal, No wheezes Cardiovascular: Reg, no murmurs noted Abdomen: Mildly obese, soft, nontender, normal BS Ext: without clubbing, cyanosis, edema Neuro: CNs grossly intact, motor and sensory intact, DTRs symmetric Skin: Limited exam, no lesions noted  DATA:   BMP Latest Ref Rng & Units 02/22/2017 01/12/2017 11/26/2016  Glucose 65 - 99 mg/dL  140(H) 84 128(H)  BUN 6 - 20 mg/dL _0 Creatinine 0.44 - 1.00 mg/dL 0.84 0.97 0.83  Sodium 135 - 145 mmol/L 141 138 141  Potassium 3.5 - 5.1 mmol/L 3.7 4.2 3.8  Chloride 101 - 111 mmol/L 107 104 107  CO2 22 - 32 mmol/L _1 Calcium 8.9 - 10.3 mg/dL 9.3 9.6 9.2    CBC Latest Ref Rng & Units 02/22/2017 01/12/2017 11/26/2016  WBC 3.6 - 11.0 K/uL 8.5 6.7 5.6  Hemoglobin 12.0 - 16.0 g/dL 11.2(L) 11.2(L) 10.7(L)  Hematocrit 35.0 - 47.0 % 32.2(L) 33.5(L) 31.1(L)  Platelets 150 - 440 K/uL 341 384.0 353   TTE 12/10/14:  LVEF 55-60%, concentric LVH, LA mildly dilated CT chest 09/19/14: Bilateral small pulmonary emboli without evidence of right heart strain these are new from the prior exam. No other focal abnormality is noted  CXR 08/17/15: Very small bilateral pleural effusions. No infiltrates. 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  4) Chronic dyspnea - mild, does not appear to prevent ADL's 5) Very small bilateral pleural effusions - most likely diagnosis would be CHF  6) Possible diastolic heart failure   PLAN:  --Continue current medications.  --No need for intervention regarding pleural effusions, these appears small and are not causing  significant dyspnea.  --She is asked to call back if she develops dyspnea, she may need to use lasix more often in that case.   Marda Stalker, MD.   Board Certified in Internal Medicine, Pulmonary Medicine, [place], and Sleep Medicine.  [facility] Office Number: [phone] Pager: [phone]  Valerie Vazquez, M.D.  Merton Border, M.D

## 2017-06-03 NOTE — Telephone Encounter (Signed)
Is she talking about the A1c? It appears Valerie Vazquez gave that result to her earlier today. I can send in [place] if she is okay with that. Please also find out if there is a reason why she is hesitant to take metformin. She needs to see the surgeon for her neck and back pain. We could have her do some physical therapy if she would like. Heat can be beneficial. She can try Tylenol as well. Please find out what she's doing at this point for this.

## 2017-06-03 NOTE — Telephone Encounter (Signed)
Tried calling, unable to leave message.

## 2017-06-03 NOTE — Telephone Encounter (Signed)
Patient notified of lab results. Please advise

## 2017-06-04 ENCOUNTER — Ambulatory Visit (INDEPENDENT_AMBULATORY_CARE_PROVIDER_SITE_OTHER): Payer: Medicare Other | Admitting: Internal Medicine

## 2017-06-04 ENCOUNTER — Encounter: Payer: Self-pay | Admitting: Internal Medicine

## 2017-06-04 ENCOUNTER — Other Ambulatory Visit: Payer: Self-pay | Admitting: Family Medicine

## 2017-06-04 ENCOUNTER — Ambulatory Visit: Payer: Medicare Other

## 2017-06-04 VITALS — BP 134/76 | HR 89 | Resp 16 | Ht 66.0 in | Wt 223.0 lb

## 2017-06-04 DIAGNOSIS — J90 Pleural effusion, not elsewhere classified: Secondary | ICD-10-CM | POA: Diagnosis not present

## 2017-06-04 DIAGNOSIS — I5032 Chronic diastolic (congestive) heart failure: Secondary | ICD-10-CM

## 2017-06-04 DIAGNOSIS — Z86711 Personal history of pulmonary embolism: Secondary | ICD-10-CM | POA: Diagnosis not present

## 2017-06-04 DIAGNOSIS — I201 Angina pectoris with documented spasm: Secondary | ICD-10-CM | POA: Diagnosis not present

## 2017-06-04 NOTE — Patient Instructions (Addendum)
--  do not eat 4 hours before bedtime, avoid fluid 3 hours before bedtime.  --Call us if you develop any trouble breathing.  --Will need to obtain results of recent sleep study.

## 2017-06-05 NOTE — Telephone Encounter (Signed)
Patient states she will call her neurosurgeon and follow up with them, she has tried off brand tylenol for arthritis. She does not want to do physical therapy at this time

## 2017-06-06 MED ORDER — EMPAGLIFLOZIN 10 MG PO TABS: 10.0000 mg | ORAL_TABLET | Freq: Every day | ORAL | 3 refills | Status: DC

## 2017-06-06 NOTE — Telephone Encounter (Signed)
Noted. Jardiance the pharmacy. Please let the patient know that if she were to develop a gastrointestinal illness with vomiting or diarrhea she should hold the medication while she is ill. She could restart when her symptoms resolve.

## 2017-06-08 NOTE — Telephone Encounter (Signed)
Left message to notify

## 2017-06-12 ENCOUNTER — Telehealth: Payer: Self-pay | Admitting: Gastroenterology

## 2017-06-12 NOTE — Telephone Encounter (Signed)
Patient called and is having new symptoms of nausea with vomiting with her constipation that turns in to diarrhea. What can she do????  Please call her later as she doesn't have any power and is having to charge her phone in her car.

## 2017-06-15 NOTE — Telephone Encounter (Signed)
Pt called and stated that she is not able to take the Jardiance because of the vomiting and having diarrhea. She has a call into the gastro.

## 2017-06-15 NOTE — Telephone Encounter (Signed)
Please advise 

## 2017-06-15 NOTE — Telephone Encounter (Signed)
Please find out what symptoms she is having. When did the vomiting and diarrhea start? Is she having any other symptoms? She should hold the jardiance until her symptoms improve. Thanks.

## 2017-06-16 ENCOUNTER — Other Ambulatory Visit: Payer: Self-pay

## 2017-06-16 ENCOUNTER — Telehealth: Payer: Self-pay | Admitting: Gastroenterology

## 2017-06-16 NOTE — Telephone Encounter (Signed)
Noted. We may need to consider an alternative diabetes medication if she has these flares often. I would suggest seeing what GI says regarding this, though given the persistence of these symptoms she should be evaluated if they continue without any improvement.

## 2017-06-16 NOTE — Telephone Encounter (Signed)
Patient called back still waiting for you to call her back regarding diarrhea and constipation. Please call her back ASAP

## 2017-06-16 NOTE — Telephone Encounter (Signed)
Contacted patient.  Patient states IBS is flaring with nausea when switching from constipation to diarrhea.  Emesis is happening at the same time as diarrhea.  Patient states right now she's back to constipation.   Advised pt I'll send a message to Dr. Vicente Males and callback with his response.  Pt states she's taking dicyclomine and phenergan.

## 2017-06-16 NOTE — Telephone Encounter (Signed)
Informed patient she will need follow up with GI due to symptoms, patient states she will keep [place] updated

## 2017-06-16 NOTE — Telephone Encounter (Signed)
Patient states when her IBS flares up she starts vomiting, patient started vomiting with diarrhea last Thursday. Patient has called GI and awaiting a call back. Patient states she has not started jardiance yet and will not take it until this flare up is gone.Patient states she has had theses symptoms off and on rotating from constipation to diarrhea with vomiting for the last month.

## 2017-06-17 NOTE — Telephone Encounter (Signed)
Try daily 17 grams miralax fro constipation

## 2017-06-18 ENCOUNTER — Telehealth: Payer: Self-pay

## 2017-06-18 NOTE — Telephone Encounter (Signed)
Contacted patient and advised to take Miralax 68ml per day per Dr. Vicente Males.

## 2017-06-22 IMAGING — CR DG CHEST 2V
1 series · 2 of 2 positions shown · non-contrast
Comparison: PA and lateral chest 07/19/2015 and 05/24/2015. CT
chest 09/19/2014.

CLINICAL DATA: Chest heaviness at night. Possible pneumonia. The
patient has been treated with Levaquin. Subsequent encounter.

EXAM:
CHEST  2 VIEW

[Series 1: dg chest 2 view · 0.14mm/px · 2 of 2 slices shown]
[im 1/2]
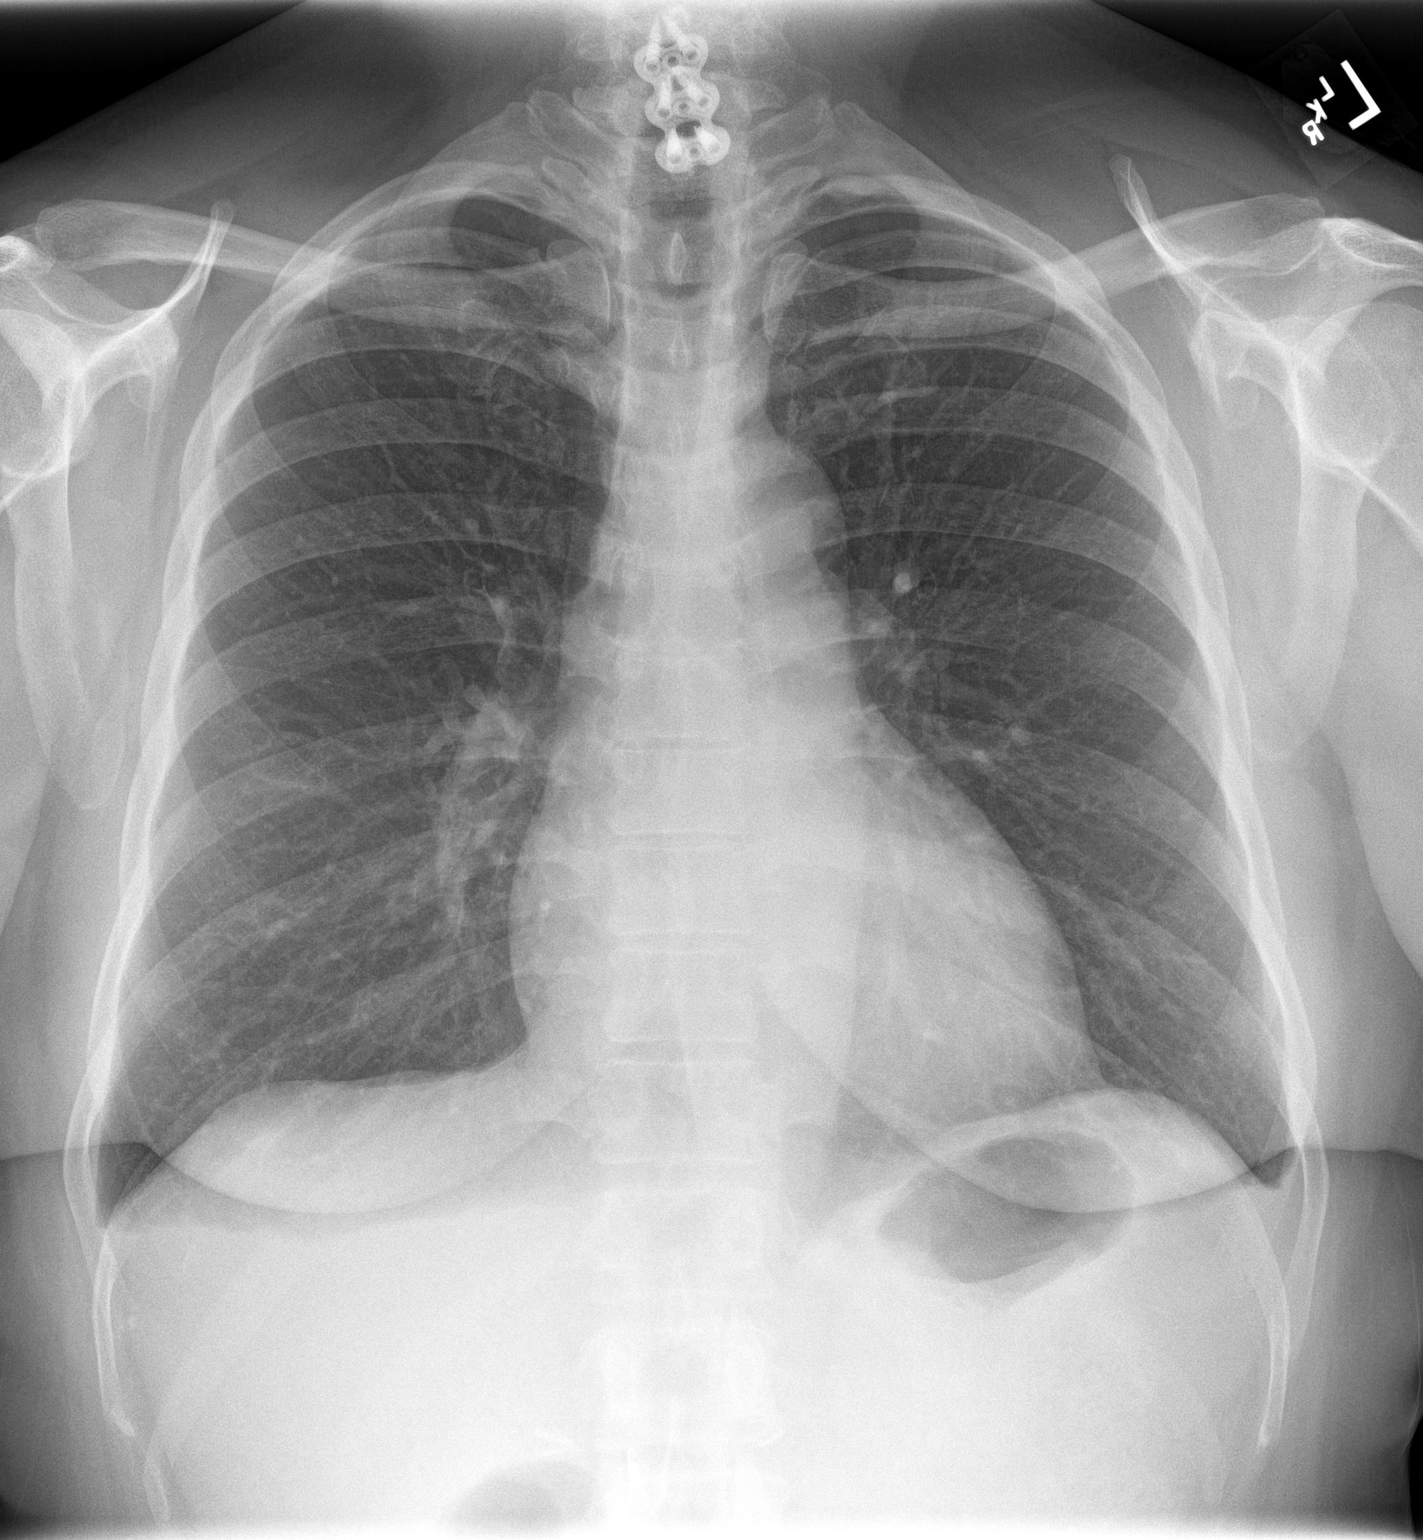
[im 2/2]
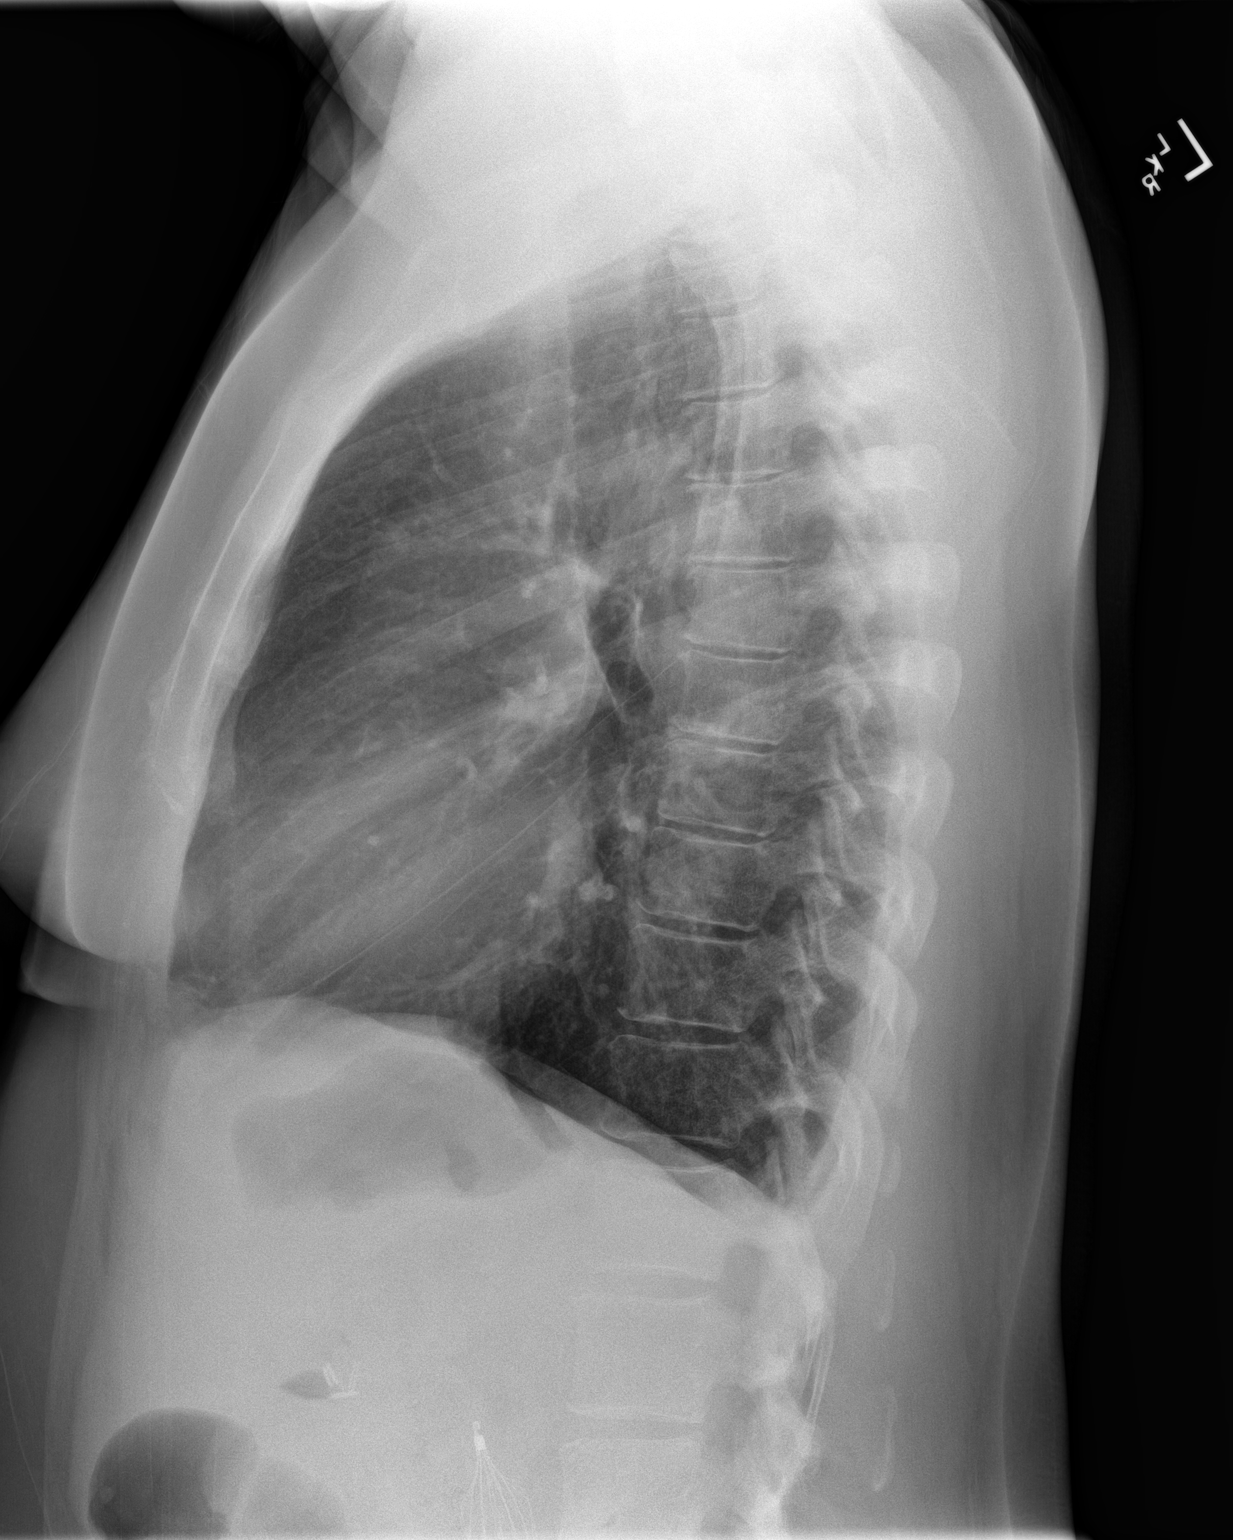

[2 of 2 positions shown; findings below may reference images not displayed]

FINDINGS: The lungs are clear. Trace bilateral pleural effusions are decreased
in size since the comparison examination. There is no pneumothorax.
Heart size is normal.
IMPRESSION: Trace bilateral pleural effusions have decreased since the most
recent examination. The study is otherwise negative.

## 2017-06-24 ENCOUNTER — Ambulatory Visit (INDEPENDENT_AMBULATORY_CARE_PROVIDER_SITE_OTHER): Payer: Medicare Other | Admitting: Psychiatry

## 2017-06-24 ENCOUNTER — Encounter: Payer: Self-pay | Admitting: Psychiatry

## 2017-06-24 ENCOUNTER — Other Ambulatory Visit: Payer: Self-pay | Admitting: Family Medicine

## 2017-06-24 VITALS — BP 140/81 | HR 112 | Temp 98.3°F | Wt 221.4 lb

## 2017-06-24 DIAGNOSIS — I201 Angina pectoris with documented spasm: Secondary | ICD-10-CM

## 2017-06-24 DIAGNOSIS — F250 Schizoaffective disorder, bipolar type: Secondary | ICD-10-CM | POA: Diagnosis not present

## 2017-06-24 DIAGNOSIS — F419 Anxiety disorder, unspecified: Secondary | ICD-10-CM

## 2017-06-24 MED ORDER — GABAPENTIN 600 MG PO TABS: 600.0000 mg | ORAL_TABLET | Freq: Two times a day (BID) | ORAL | 1 refills | Status: DC

## 2017-06-24 MED ORDER — OLANZAPINE 10 MG PO TABS: 10.0000 mg | ORAL_TABLET | Freq: Every day | ORAL | 1 refills | Status: DC
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
## 2017-06-27 ENCOUNTER — Other Ambulatory Visit: Payer: Self-pay | Admitting: Family Medicine

## 2017-06-29 NOTE — Telephone Encounter (Signed)
Patient's Last OV was 9/28, Last refill was that day, please advise, thanks

## 2017-07-05 ENCOUNTER — Other Ambulatory Visit: Payer: Self-pay | Admitting: Family Medicine

## 2017-07-05 DIAGNOSIS — Z760 Encounter for issue of repeat prescription: Secondary | ICD-10-CM

## 2017-07-09 ENCOUNTER — Other Ambulatory Visit: Payer: Self-pay | Admitting: Family Medicine

## 2017-07-09 DIAGNOSIS — Z760 Encounter for issue of repeat prescription: Secondary | ICD-10-CM
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
## 2017-07-09 IMAGING — CR DG FEMUR 2+V*R*
4 series · 4 of 4 positions shown · non-contrast
Comparison: None.

CLINICAL DATA: Initial encounter for right hip pain after fall
yesterday.

EXAM:
RIGHT FEMUR 2 VIEWS

[femur lat (1 of 2)]
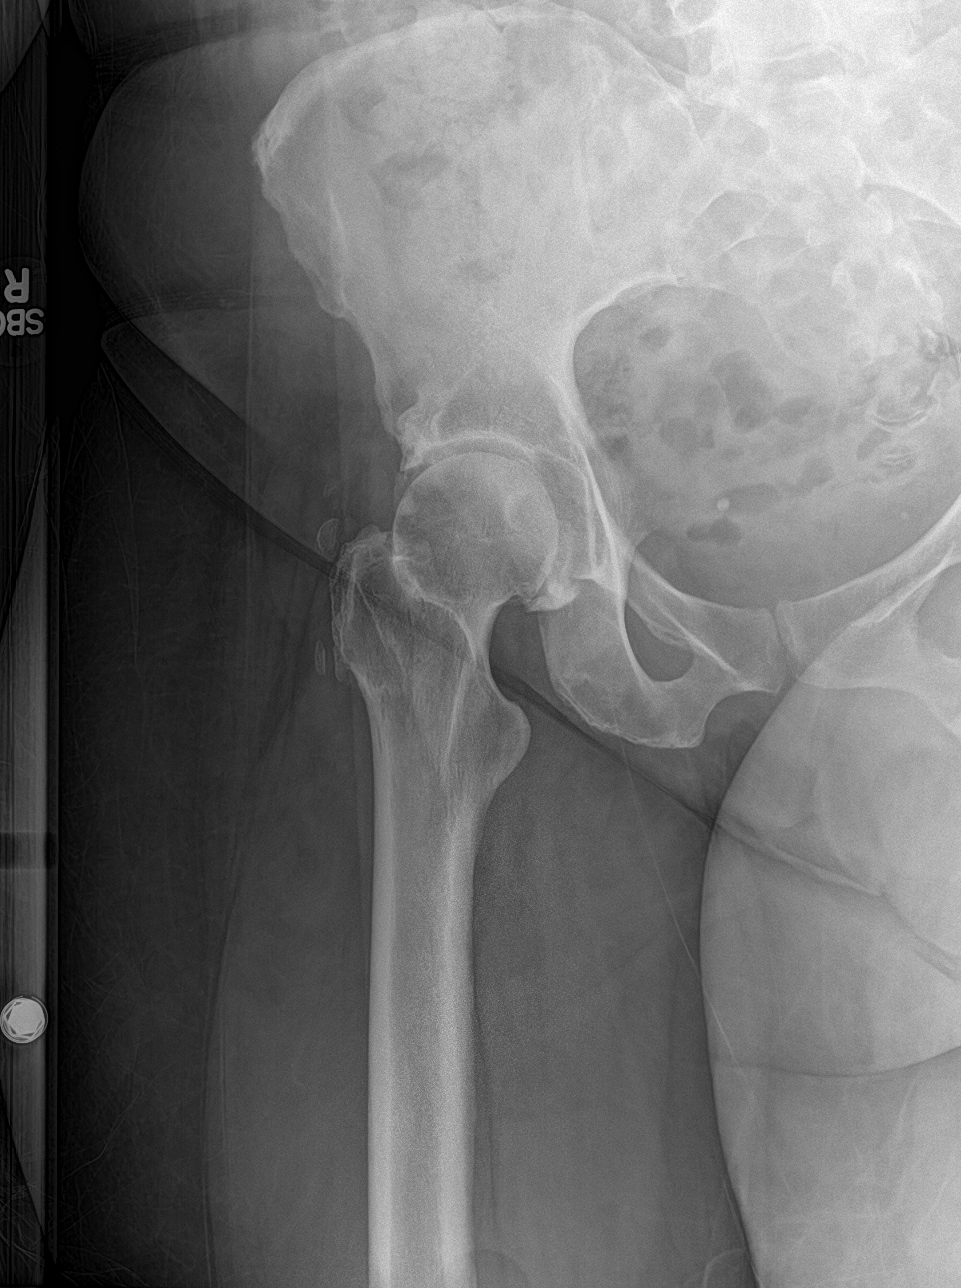

[femur lat (2 of 2)]
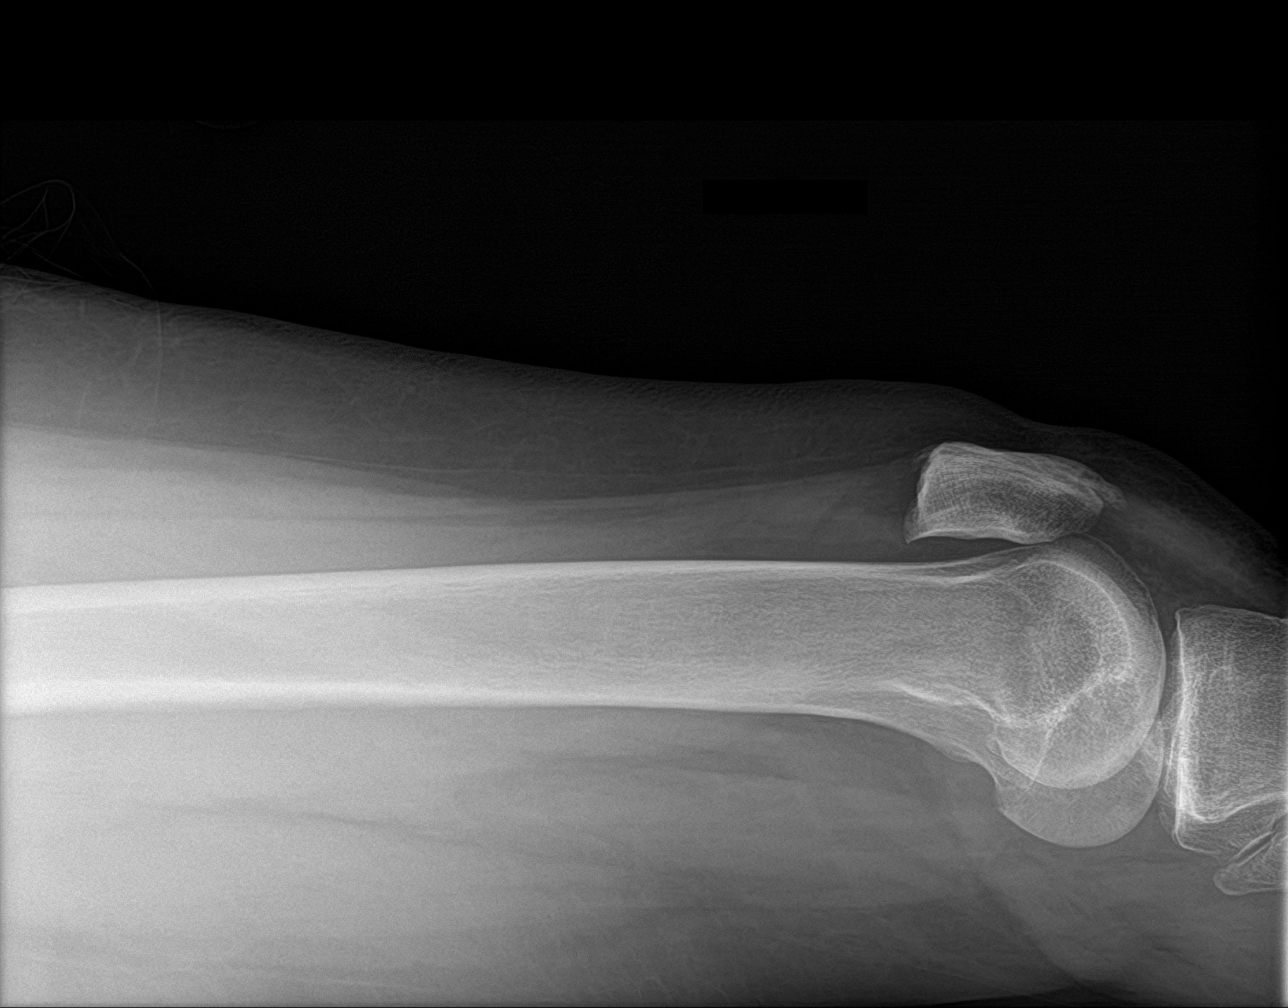

[femur ap (1 of 2)]
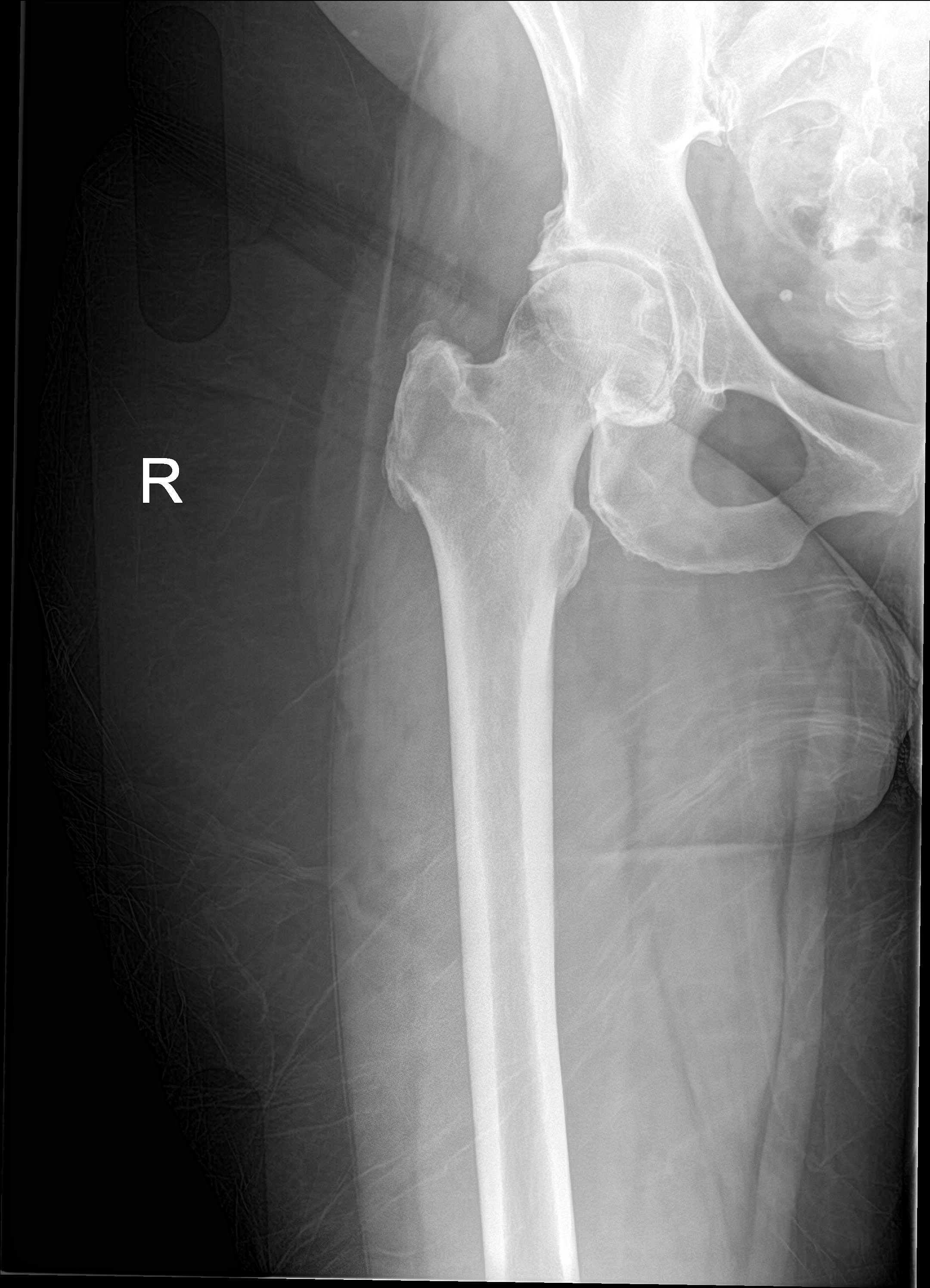

[femur ap (2 of 2)]
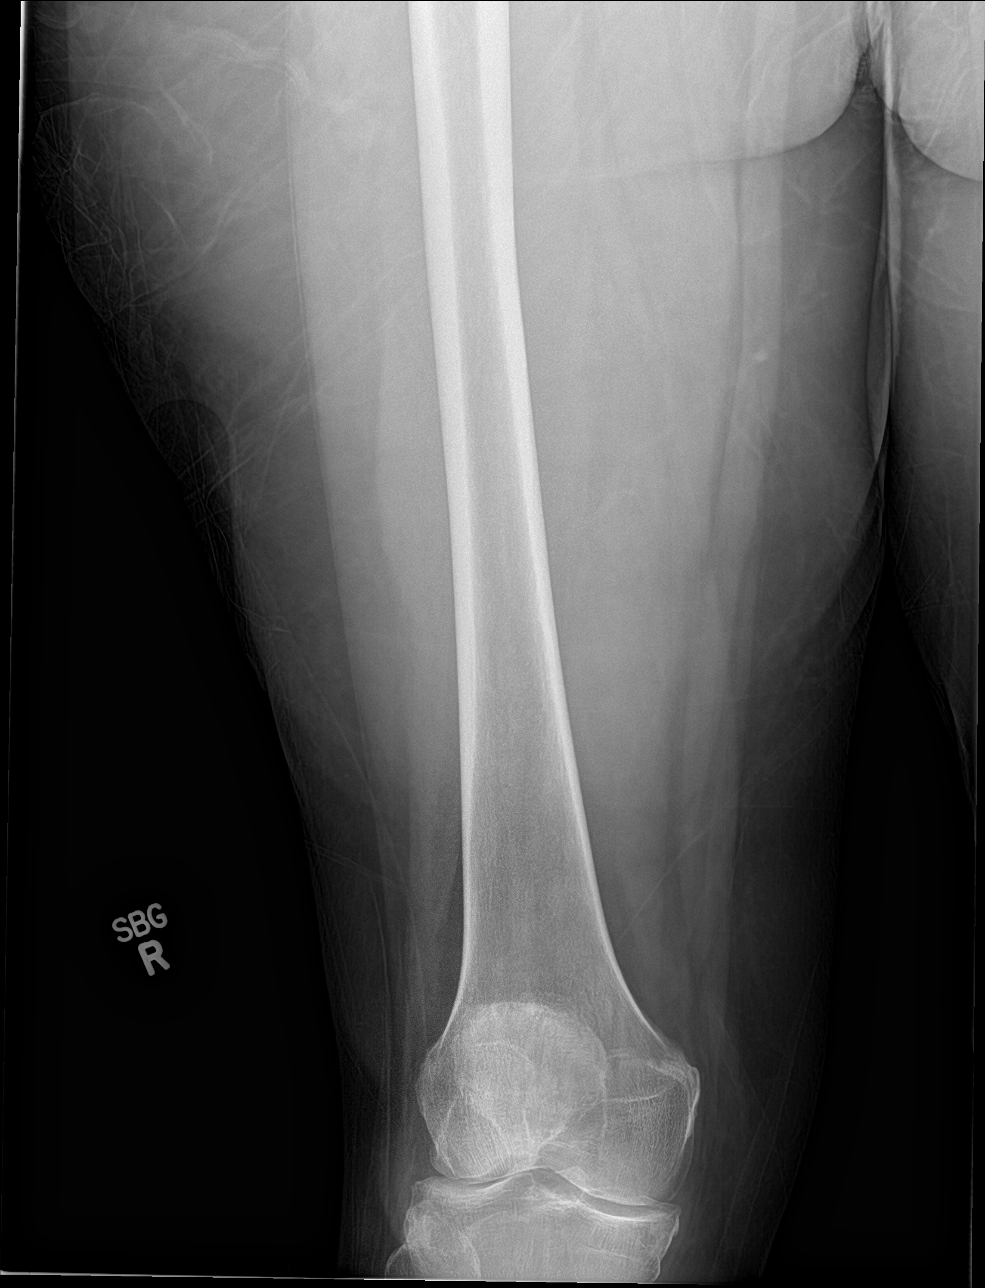

[4 of 4 positions shown; findings below may reference images not displayed]

FINDINGS: Linear band of sclerosis over the femoral neck probably related to
spurring. Nondisplaced impacted fracture considered less likely. No
worrisome lytic or sclerotic osseous abnormality. Degenerative
changes are noted in the acetabulum.
IMPRESSION: No definite femur fracture. There is a linear band of increased
density overlying the femoral neck which probably reflects a collar
of spurs. Patient has signs/symptoms referable to the right hip,
consider dedicated right hip films to further evaluate.
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
## 2017-07-27 ENCOUNTER — Other Ambulatory Visit: Payer: Self-pay | Admitting: Family Medicine

## 2017-07-27 DIAGNOSIS — Z760 Encounter for issue of repeat prescription: Secondary | ICD-10-CM

## 2017-07-30 ENCOUNTER — Other Ambulatory Visit: Payer: Self-pay | Admitting: Family Medicine

## 2017-08-17 ENCOUNTER — Other Ambulatory Visit: Payer: Self-pay | Admitting: Family Medicine

## 2017-08-19 ENCOUNTER — Other Ambulatory Visit: Payer: Self-pay

## 2017-08-19 ENCOUNTER — Ambulatory Visit (INDEPENDENT_AMBULATORY_CARE_PROVIDER_SITE_OTHER): Payer: Medicare HMO | Admitting: Psychiatry

## 2017-08-19 ENCOUNTER — Encounter: Payer: Self-pay | Admitting: Psychiatry

## 2017-08-19 VITALS — BP 116/69 | HR 91 | Temp 98.6°F | Wt 219.8 lb

## 2017-08-19 DIAGNOSIS — F419 Anxiety disorder, unspecified: Secondary | ICD-10-CM

## 2017-08-19 DIAGNOSIS — F250 Schizoaffective disorder, bipolar type: Secondary | ICD-10-CM

## 2017-08-19 DIAGNOSIS — R69 Illness, unspecified: Secondary | ICD-10-CM | POA: Diagnosis not present

## 2017-08-19 DIAGNOSIS — I201 Angina pectoris with documented spasm: Secondary | ICD-10-CM

## 2017-08-19 MED ORDER — ESCITALOPRAM OXALATE 20 MG PO TABS: 20.0000 mg | ORAL_TABLET | Freq: Every day | ORAL | 1 refills | Status: DC
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
## 2017-08-20 ENCOUNTER — Encounter: Payer: Self-pay | Admitting: Psychiatry

## 2017-08-21 ENCOUNTER — Ambulatory Visit (INDEPENDENT_AMBULATORY_CARE_PROVIDER_SITE_OTHER): Payer: Medicare HMO | Admitting: Internal Medicine

## 2017-08-21 ENCOUNTER — Encounter: Payer: Self-pay | Admitting: Internal Medicine

## 2017-08-21 ENCOUNTER — Ambulatory Visit (INDEPENDENT_AMBULATORY_CARE_PROVIDER_SITE_OTHER): Payer: Medicare HMO

## 2017-08-21 VITALS — BP 118/74 | HR 94 | Temp 98.2°F | Resp 16 | Ht 66.0 in | Wt 217.4 lb

## 2017-08-21 DIAGNOSIS — E785 Hyperlipidemia, unspecified: Secondary | ICD-10-CM | POA: Diagnosis not present

## 2017-08-21 DIAGNOSIS — J90 Pleural effusion, not elsewhere classified: Secondary | ICD-10-CM | POA: Diagnosis not present

## 2017-08-21 DIAGNOSIS — R946 Abnormal results of thyroid function studies: Secondary | ICD-10-CM

## 2017-08-21 DIAGNOSIS — I201 Angina pectoris with documented spasm: Secondary | ICD-10-CM

## 2017-08-21 DIAGNOSIS — K219 Gastro-esophageal reflux disease without esophagitis: Secondary | ICD-10-CM | POA: Diagnosis not present

## 2017-08-21 DIAGNOSIS — R0602 Shortness of breath: Secondary | ICD-10-CM | POA: Diagnosis not present

## 2017-08-21 DIAGNOSIS — K582 Mixed irritable bowel syndrome: Secondary | ICD-10-CM | POA: Diagnosis not present

## 2017-08-21 DIAGNOSIS — R197 Diarrhea, unspecified: Secondary | ICD-10-CM

## 2017-08-21 DIAGNOSIS — R0781 Pleurodynia: Secondary | ICD-10-CM | POA: Diagnosis not present

## 2017-08-21 DIAGNOSIS — R079 Chest pain, unspecified: Secondary | ICD-10-CM

## 2017-08-21 DIAGNOSIS — Z1329 Encounter for screening for other suspected endocrine disorder: Secondary | ICD-10-CM

## 2017-08-21 DIAGNOSIS — R58 Hemorrhage, not elsewhere classified: Secondary | ICD-10-CM

## 2017-08-21 DIAGNOSIS — E119 Type 2 diabetes mellitus without complications: Secondary | ICD-10-CM | POA: Diagnosis not present

## 2017-08-21 LAB — COMPREHENSIVE METABOLIC PANEL
ALT: 23 U/L (ref 0–35)
AST: 22 U/L (ref 0–37)
Albumin: 4.3 g/dL (ref 3.5–5.2)
Alkaline Phosphatase: 124 U/L — ABNORMAL HIGH (ref 39–117)
BUN: 8 mg/dL (ref 6–23)
CO2: 28 mEq/L (ref 19–32)
Calcium: 9.5 mg/dL (ref 8.4–10.5)
Chloride: 103 mEq/L (ref 96–112)
Creatinine, Ser: 0.96 mg/dL (ref 0.40–1.20)
GFR: 75.4 mL/min (ref >60.00)
Glucose, Bld: 152 mg/dL — ABNORMAL HIGH (ref 70–99)
Potassium: 3.7 mEq/L (ref 3.5–5.1)
Sodium: 139 mEq/L (ref 135–145)
Total Bilirubin: 0.5 mg/dL (ref 0.2–1.2)
Total Protein: 7.8 g/dL (ref 6.0–8.3)

## 2017-08-21 LAB — LIPID PANEL
Cholesterol: 304 mg/dL — ABNORMAL HIGH (ref 0–200)
HDL: 38.7 mg/dL — ABNORMAL LOW (ref >39.00)
LDL Cholesterol: 227 mg/dL — ABNORMAL HIGH (ref 0–99)
NonHDL: 265.15
Total CHOL/HDL Ratio: 8
Triglycerides: 190 mg/dL — ABNORMAL HIGH (ref 0.0–149.0)
VLDL: 38 mg/dL (ref 0.0–40.0)

## 2017-08-21 LAB — CBC WITH DIFFERENTIAL/PLATELET
Basophils Absolute: 0.1 10*3/uL (ref 0.0–0.1)
Basophils Relative: 1.1 % (ref 0.0–3.0)
Eosinophils Absolute: 0.3 10*3/uL (ref 0.0–0.7)
Eosinophils Relative: 5.1 % — ABNORMAL HIGH (ref 0.0–5.0)
HCT: 39 % (ref 36.0–46.0)
Hemoglobin: 12.8 g/dL (ref 12.0–15.0)
Lymphocytes Relative: 38.6 % (ref 12.0–46.0)
Lymphs Abs: 2.4 10*3/uL (ref 0.7–4.0)
MCHC: 32.9 g/dL (ref 30.0–36.0)
MCV: 92.5 fl (ref 78.0–100.0)
Monocytes Absolute: 0.3 10*3/uL (ref 0.1–1.0)
Monocytes Relative: 5.5 % (ref 3.0–12.0)
Neutro Abs: 3.1 10*3/uL (ref 1.4–7.7)
Neutrophils Relative %: 49.7 % (ref 43.0–77.0)
Platelets: 416 10*3/uL — ABNORMAL HIGH (ref 150.0–400.0)
RBC: 4.22 Mil/uL (ref 3.87–5.11)
RDW: 14.8 % (ref 11.5–15.5)
WBC: 6.2 10*3/uL (ref 4.0–10.5)

## 2017-08-21 LAB — URINALYSIS, ROUTINE W REFLEX MICROSCOPIC
Hgb urine dipstick: NEGATIVE
Ketones, ur: NEGATIVE
Leukocytes, UA: NEGATIVE
Nitrite: NEGATIVE
Specific Gravity, Urine: >=1.03 — AB (ref 1.000–1.030)
Urine Glucose: NEGATIVE
Urobilinogen, UA: 1 (ref 0.0–1.0)
pH: 5.5 (ref 5.0–8.0)

## 2017-08-21 LAB — T4, FREE: Free T4: 0.81 ng/dL (ref 0.60–1.60)

## 2017-08-21 LAB — D-DIMER, QUANTITATIVE (NOT AT ARMC): D-Dimer, Quant: 0.43 mcg/mL FEU (ref <0.50)

## 2017-08-21 LAB — TROPONIN I: Troponin I: <0.01 ng/mL (ref <=0.0)

## 2017-08-21 LAB — TSH: TSH: 7.72 u[IU]/mL — ABNORMAL HIGH (ref 0.35–4.50)

## 2017-08-21 MED ORDER — SUCRALFATE 1 G PO TABS: 1.0000 g | ORAL_TABLET | Freq: Three times a day (TID) | ORAL | 0 refills | Status: DC

## 2017-08-21 NOTE — Patient Instructions (Addendum)
We will refer you to Dr. Angelena Form for chest pain if worse go to the ED  Labs today  Try Miralax/Colace/Metamucil when constipated with IBS  Change your diet  Increase water and Fiber  F/u in 1 month sooner if needed   Diet for Irritable Bowel Syndrome When you have irritable bowel syndrome (IBS), the foods you eat and your eating habits are very important. IBS may cause various symptoms, such as abdominal pain, constipation, or diarrhea. Choosing the right foods can help ease discomfort caused by these symptoms. Work with your health care provider and dietitian to find the best eating plan to help control your symptoms. What general guidelines do I need to follow?  Keep a food diary. This will help you identify foods that cause symptoms. Write down: ? What you eat and when. ? What symptoms you have. ? When symptoms occur in relation to your meals.  Avoid foods that cause symptoms. Talk with your dietitian about other ways to get the same nutrients that are in these foods.  Eat more foods that contain fiber. Take a fiber supplement if directed by your dietitian.  Eat your meals slowly, in a relaxed setting.  Aim to eat 5-6 small meals per day. Do not skip meals.  Drink enough fluids to keep your urine clear or pale yellow.  Ask your health care provider if you should take an over-the-counter probiotic during flare-ups to help restore healthy gut bacteria.  If you have cramping or diarrhea, try making your meals low in fat and high in carbohydrates. Examples of carbohydrates are pasta, rice, whole grain breads and cereals, fruits, and vegetables.  If dairy products cause your symptoms to flare up, try eating less of them. You might be able to handle yogurt better than other dairy products because it contains bacteria that help with digestion. What foods are not recommended? The following are some foods and drinks that may worsen your symptoms:  Fatty foods, such as [place]  fries.  Milk products, such as cheese or ice cream.  Chocolate.  Alcohol.  Products with caffeine, such as coffee.  Carbonated drinks, such as soda.  The items listed above may not be a complete list of foods and beverages to avoid. Contact your dietitian for more information. What foods are good sources of fiber? Your health care provider or dietitian may recommend that you eat more foods that contain fiber. Fiber can help reduce constipation and other IBS symptoms. Add foods with fiber to your diet a little at a time so that your body can get used to them. Too much fiber at once might cause gas and swelling of your abdomen. The following are some foods that are good sources of fiber:  Apples.  Peaches.  Pears.  Berries.  Figs.  Broccoli (raw).  Cabbage.  Carrots.  Raw peas.  Kidney beans.  Lima beans.  Whole grain bread.  Whole grain cereal.  Where to find more information: [employer] for Functional Gastrointestinal Disorders: www.iffgd.[employer] of Diabetes and Digestive and Kidney Diseases: NetworkAffair.co.za.aspx This information is not intended to replace advice given to you by your health care provider. Make sure you discuss any questions you have with your health care provider. Document Released: 11/08/2003 Document Revised: 01/24/2016 Document Reviewed: 11/18/2013 Elsevier Interactive Patient Education  2018 [place].  Nonspecific Chest Pain Chest pain can be caused by many different conditions. There is a chance that your pain could be related to something serious, such as a heart  attack or a blood clot in your lungs. Chest pain can also be caused by conditions that are not life-threatening. If you have chest pain, it is very important to follow up with your doctor. Follow these instructions at home: Medicines  If you were prescribed an antibiotic  medicine, take it as told by your doctor. Do not stop taking the antibiotic even if you start to feel better.  Take over-the-counter and prescription medicines only as told by your doctor. Lifestyle  Do not use any products that contain nicotine or tobacco, such as cigarettes and e-cigarettes. If you need help quitting, ask your doctor.  Do not drink alcohol.  Make lifestyle changes as told by your doctor. These may include: ? Getting regular exercise. Ask your doctor for some activities that are safe for you. ? Eating a heart-healthy diet. A diet specialist (dietitian) can help you to learn healthy eating options. ? Staying at a healthy weight. ? Managing diabetes, if needed. ? Lowering your stress, as with deep breathing or spending time in nature. General instructions  Avoid any activities that make you feel chest pain.  If your chest pain is because of heartburn: ? Raise (elevate) the head of your bed about 6 inches (15 cm). You can do this by putting blocks under the bed legs at the head of the bed. ? Do not sleep with extra pillows under your head. That does not help heartburn.  Keep all follow-up visits as told by your doctor. This is important. This includes any further testing if your chest pain does not go away. Contact a doctor if:  Your chest pain does not go away.  You have a rash with blisters on your chest.  You have a fever.  You have chills. Get help right away if:  Your chest pain is worse.  You have a cough that gets worse, or you cough up blood.  You have very bad (severe) pain in your belly (abdomen).  You are very weak.  You pass out (faint).  You have either of these for no clear reason: ? Sudden chest discomfort. ? Sudden discomfort in your arms, back, neck, or jaw.  You have shortness of breath at any time.  You suddenly start to sweat, or your skin gets clammy.  You feel sick to your stomach (nauseous).  You throw up (vomit).  You  suddenly feel light-headed or dizzy.  Your heart starts to beat fast, or it feels like it is skipping beats. These symptoms may be an emergency. Do not wait to see if the symptoms will go away. Get medical help right away. Call your local emergency services (911 in the [place] Do not drive yourself to the hospital. This information is not intended to replace advice given to you by your health care provider. Make sure you discuss any questions you have with your health care provider. Document Released: 02/04/2008 Document Revised: 05/12/2016 Document Reviewed: 05/12/2016 Elsevier Interactive Patient Education  2017 [employer].

## 2017-08-26 ENCOUNTER — Encounter: Payer: Self-pay | Admitting: Internal Medicine

## 2017-08-26 DIAGNOSIS — R5381 Other malaise: Secondary | ICD-10-CM | POA: Diagnosis not present

## 2017-08-26 DIAGNOSIS — E039 Hypothyroidism, unspecified: Secondary | ICD-10-CM | POA: Insufficient documentation

## 2017-08-26 DIAGNOSIS — G8929 Other chronic pain: Secondary | ICD-10-CM | POA: Diagnosis not present

## 2017-08-26 DIAGNOSIS — E038 Other specified hypothyroidism: Secondary | ICD-10-CM | POA: Insufficient documentation

## 2017-08-26 DIAGNOSIS — R0789 Other chest pain: Secondary | ICD-10-CM | POA: Diagnosis not present

## 2017-08-26 DIAGNOSIS — R69 Illness, unspecified: Secondary | ICD-10-CM | POA: Diagnosis not present

## 2017-08-26 DIAGNOSIS — I10 Essential (primary) hypertension: Secondary | ICD-10-CM | POA: Diagnosis not present

## 2017-08-26 DIAGNOSIS — M791 Myalgia, unspecified site: Secondary | ICD-10-CM | POA: Diagnosis not present

## 2017-08-26 DIAGNOSIS — R0602 Shortness of breath: Secondary | ICD-10-CM | POA: Diagnosis not present

## 2017-08-26 DIAGNOSIS — Z86718 Personal history of other venous thrombosis and embolism: Secondary | ICD-10-CM | POA: Diagnosis not present

## 2017-08-26 DIAGNOSIS — K219 Gastro-esophageal reflux disease without esophagitis: Secondary | ICD-10-CM | POA: Diagnosis not present

## 2017-08-26 DIAGNOSIS — J90 Pleural effusion, not elsewhere classified: Secondary | ICD-10-CM | POA: Diagnosis not present

## 2017-08-26 DIAGNOSIS — R079 Chest pain, unspecified: Secondary | ICD-10-CM | POA: Diagnosis not present

## 2017-08-26 IMAGING — MR MR LUMBAR SPINE W/O CM
4 of 5 series · 18 of 48 positions shown · non-contrast
Comparison: CT abdomen and pelvis October 28, 2014

CLINICAL DATA: Back pain, numbness and pain in bilateral lower
extremities. Bowel and bladder issues.

EXAM:
MRI LUMBAR SPINE WITHOUT CONTRAST
TECHNIQUE: Multiplanar, multisequence MR imaging of the lumbar spine was
performed. No intravenous contrast was administered.

[Series 3: T2 · sagittal · 4.0mm · 0.55mm/px · 5 of 14 slices shown (1 of 2)]
[im 1/14]
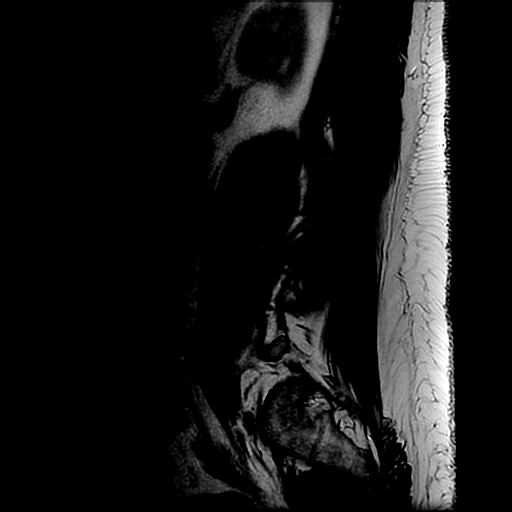
[im 4/14]
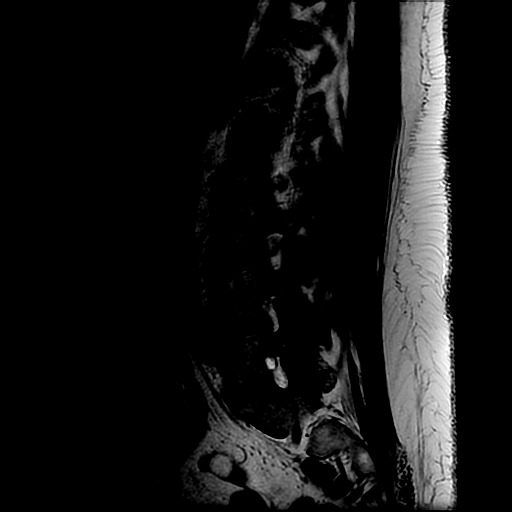
[im 7/14]
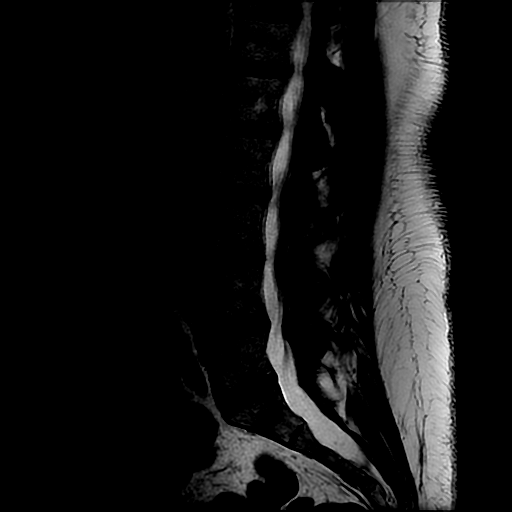
[im 10/14]
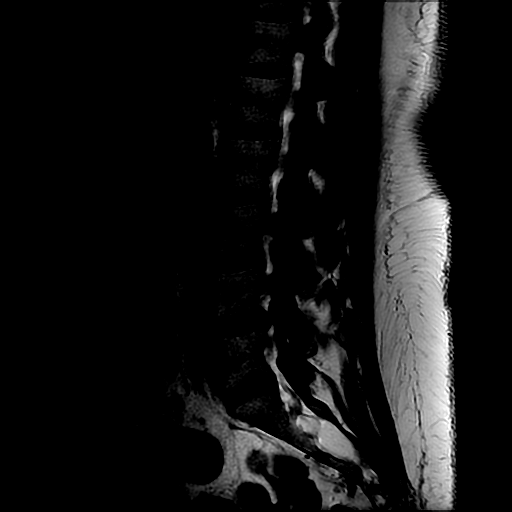
[im 14/14]
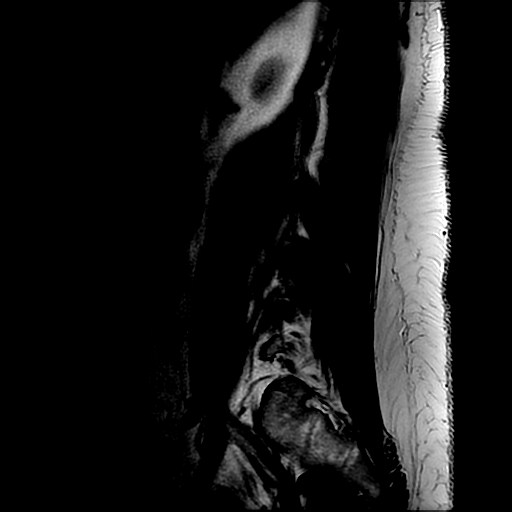

[Series 4: T1 · sagittal · 4.0mm · 0.55mm/px · 3 of 14 slices shown (1 of 2)]
[im 1/14]
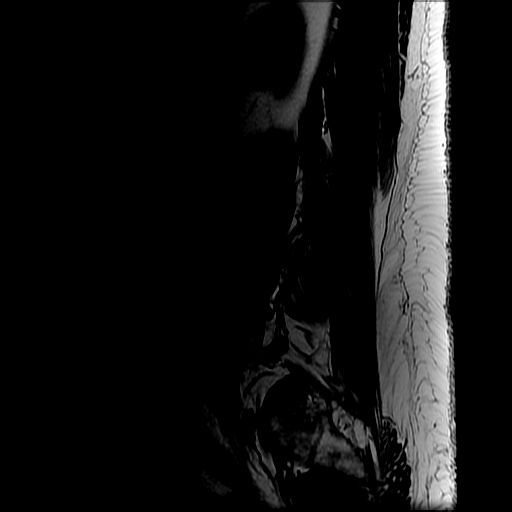
[im 7/14]
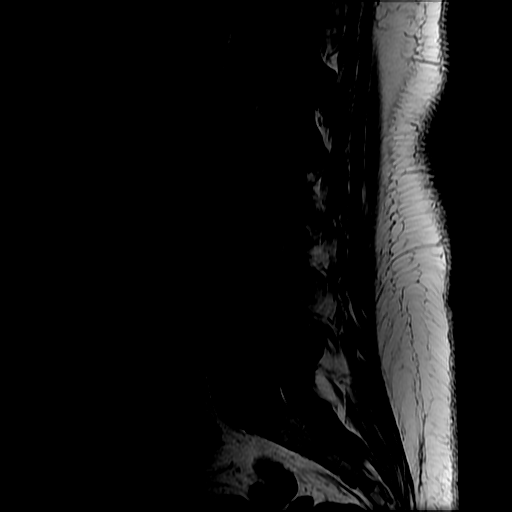
[im 14/14]
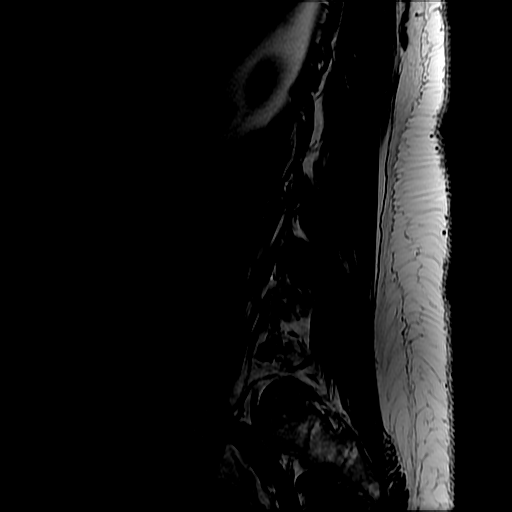

[Series 6: T2 · axial · 5.0mm · 0.39mm/px · z∈[-50,+126]mm · 7 of 40 slices shown (2 of 2)]
[im 3/40]
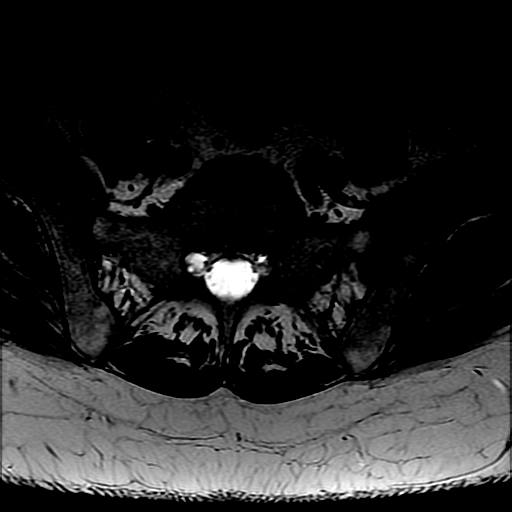
[im 6/40]
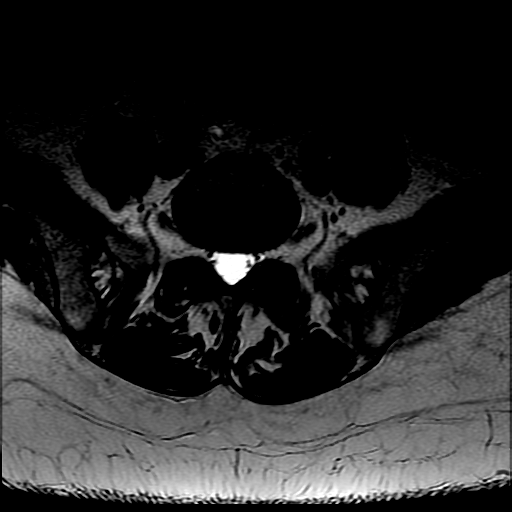
[im 8/40]
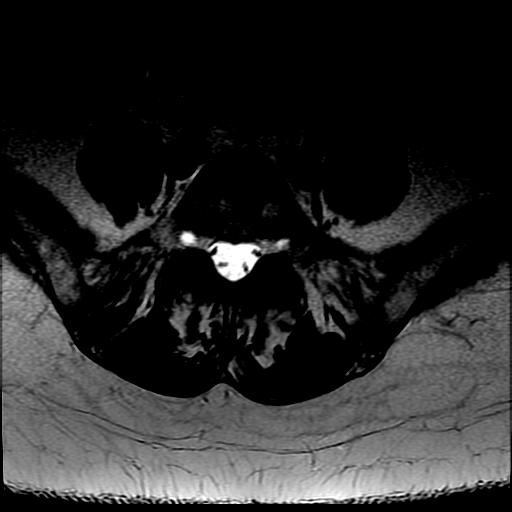
[im 14/40]
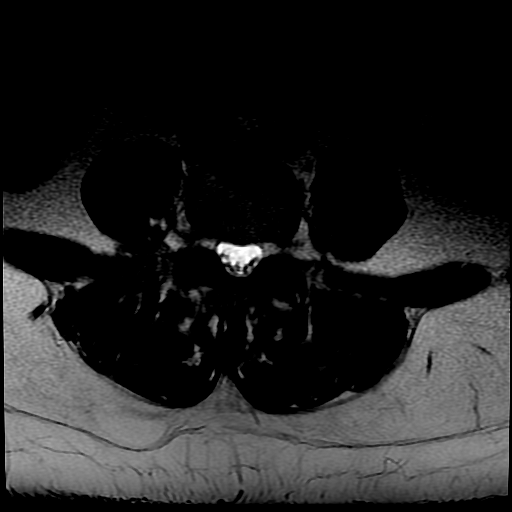
[im 19/40]
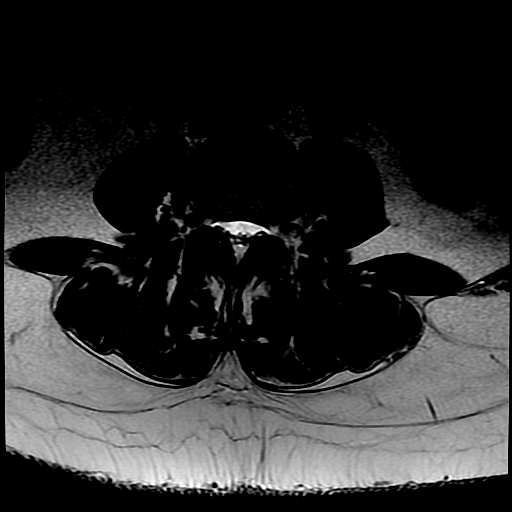
[im 21/40]
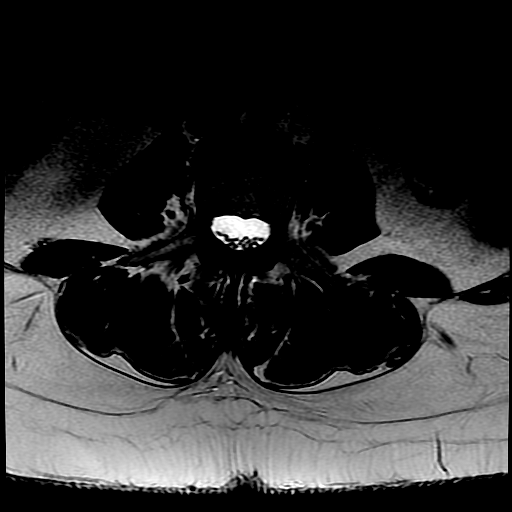
[im 34/40]
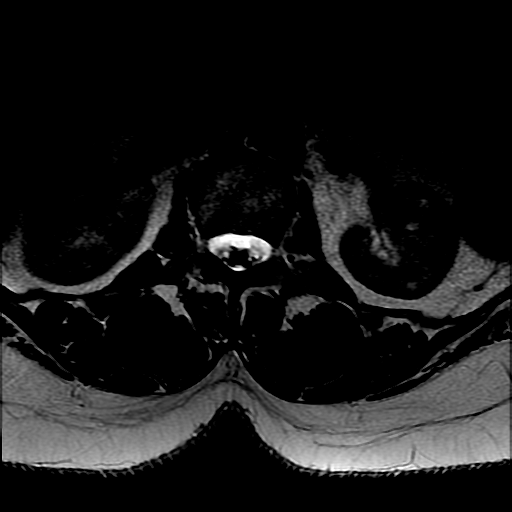

[Series 7: T1 · axial · 5.0mm · 0.39mm/px · z∈[-35,+126]mm · 3 of 40 slices shown (2 of 2)]
[im 6/40]
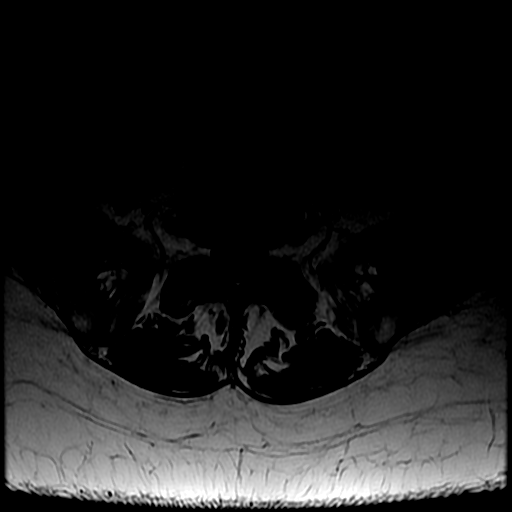
[im 21/40]
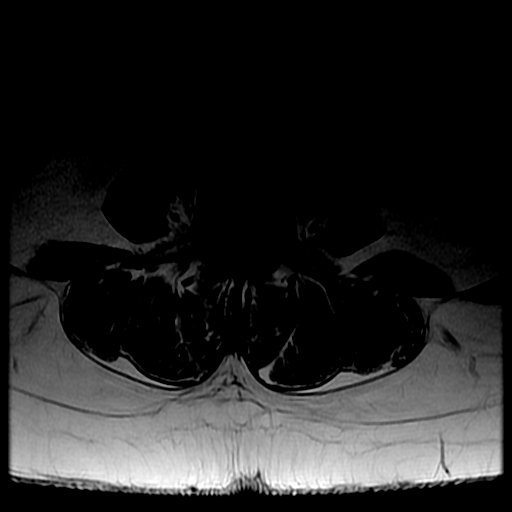
[im 34/40]
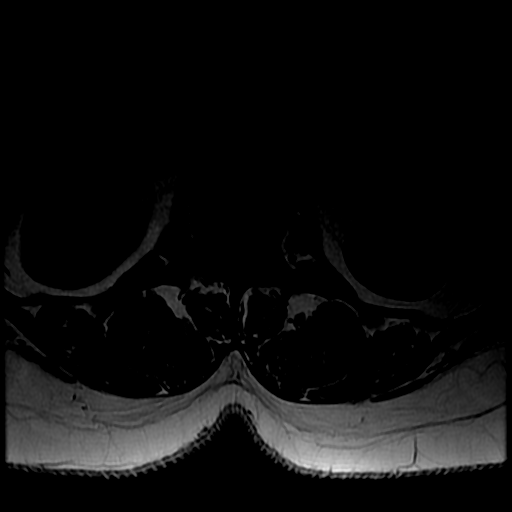

[18 of 48 positions shown; findings below may reference images not displayed]

FINDINGS: Lumbar vertebral bodies and posterior elements are intact and
aligned with maintenance of lumbar lordosis. Using the reference
level of the last well-formed intervertebral disc as L5-S1,
decreased T2 signal within the L4-5 disc, to lesser extent the
remaining lumbar disc compatible with mild desiccation, lumbar disc
morphology is maintained. Minimal chronic discogenic endplate
changes L2-3 through L4-5. No STIR signal abnormality to suggest
acute osseous process.

Conus medullaris terminates at L1-2 and appears normal morphology
and signal characteristics. Cauda equina is normal. Multiple
partially imaged sacrum meningeal cysts, mildly expanding the sacral
canal.

Level by level evaluation:

L1-2: No disc bulge. Moderate facet arthropathy and ligamentum
flavum redundancy without canal stenosis or neural foraminal
narrowing.

L2-3: No disc bulge. Moderate to severe facet arthropathy and
ligamentum flavum redundancy result in very mild canal stenosis
without neural foraminal narrowing.

L3-4: No disc bulge. Moderate to severe facet arthropathy and
ligamentum flavum redundancy result in very mild canal stenosis
without neural foraminal narrowing.

L4-5: Small 2 mm broad-based disc bulge. Severe facet arthropathy
and ligamentum flavum redundancy with trace facet effusions which
are likely reactive. Mild to moderate canal stenosis, partial
effacement of the RIGHT lateral recess which could affect the
traversing RIGHT L5 nerve. Moderate RIGHT neural foraminal
narrowing, very mild on the LEFT.

L5-S1: No disc bulge. Severe facet arthropathy without canal
stenosis or neural foraminal narrowing. 4 mm RIGHT perineural cyst.
IMPRESSION: No acute lumbar spine fracture or malalignment.

Advanced facet arthropathy.

Mild to moderate canal stenosis at L4-5, very mild at L2-3 and L3-4.

Moderate RIGHT L4-5 neural foraminal narrowing.

Multiple partially imaged sacral meningeal cyst.

## 2017-08-26 NOTE — Progress Notes (Signed)
Chief Complaint  Patient presents with  . Shortness of Breath   Acute visit  1. C/o dark streaks on left and right great toe new and denies trauma  2. C/o SOB, right shoulder blade pain and chest pain and h/o vomiting last week and 3-4x last night. Pain radiates to back. Reviewed hx +DVT/PE on Eliquis and also sees Dr. Angelena Form for coronary spasm.   3. She also has h/o IBS follows with GI and has constipation alt with diarrhea and GERD. Most recently n/v/diarrhea reports 3-4 x last night.  She also reports reduced appetite. She reports she takes her protonix qhs.   4. DM 2 stopped Jardiance 10 mg 2/2 nausea. A1C 7.9 05/29/2017    Review of Systems  Constitutional: Negative for weight loss.  HENT: Negative for hearing loss.   Respiratory: Positive for shortness of breath.   Cardiovascular: Positive for chest pain.  Gastrointestinal: Positive for constipation, diarrhea, heartburn, nausea and vomiting.       +reduce appetite   Musculoskeletal: Positive for back pain.  Skin:       +toenail lesions both big toes   Neurological: Negative for headaches.  Endo/Heme/Allergies: Does not bruise/bleed easily.  Psychiatric/Behavioral: Negative for memory loss.   Past Medical History:  Diagnosis Date  . Anemia   . Bilateral renal cysts   . Chronic back pain    "upper and lower" (09/19/2014)  . Depression    "major" (09/19/2014)  . DVT (deep venous thrombosis) ([place])   . Esophageal spasm   . Gallstones   . Gastritis   . Gastroenteritis   . GERD (gastroesophageal reflux disease)   . Headache    "couple /month lately" (09/19/2014)  . High blood pressure   . High cholesterol   . History of blood transfusion 1985   related to hysterectomy  . Hypertension   . IBS (irritable bowel syndrome)   . Migraine    "a few times/yr" (09/19/2014)  . Myocardial infarction ([place]) 10/2010 X 3   "while hospitalized"  . Osteoarthritis    "qwhere; mostly around my joints" (09/19/2014)  . Pneumonia 04/2014  .  PTSD (post-traumatic stress disorder)   . Pulmonary embolism ([place]) 04/2001; 09/19/2014   "after gallbladder OR; "  . Schizoaffective disorder ([place])   . Sleep apnea    "mild" (09/19/2014)  . Snoring    a. sleep study 5/16: No OSA  . Spinal stenosis   . Spondylosis   . Type II diabetes mellitus ([place])    "dx'd in 2007; lost weight; no RX for ~ 4 yr now" (09/19/2014)   Past Surgical History:  Procedure Laterality Date  . ABDOMINAL HYSTERECTOMY  1985  . ANTERIOR CERVICAL DECOMP/DISCECTOMY FUSION  10/2012  . APPENDECTOMY  1985  . BACK SURGERY    . BREAST CYST EXCISION Right   . BREAST EXCISIONAL BIOPSY Right YRS AGO   NEG  . CARDIAC CATHETERIZATION   2000's; 2009  . CHOLECYSTECTOMY  2002  . COLONOSCOPY WITH PROPOFOL N/A 12/12/2016   Procedure: COLONOSCOPY WITH PROPOFOL;  Surgeon: [name], MD;  Location: [facility] ENDOSCOPY;  Service: Endoscopy;  Laterality: N/A;  . COLONOSCOPY WITH PROPOFOL N/A 02/17/2017   Procedure: COLONOSCOPY WITH PROPOFOL;  Surgeon: [name], MD;  Location: [facility] ENDOSCOPY;  Service: Gastroenterology;  Laterality: N/A;  . ESOPHAGOGASTRODUODENOSCOPY (EGD) WITH PROPOFOL N/A 02/17/2017   Procedure: ESOPHAGOGASTRODUODENOSCOPY (EGD) WITH PROPOFOL;  Surgeon: [name], MD;  Location: [facility] ENDOSCOPY;  Service: Gastroenterology;  Laterality: N/A;  . EXCISION/RELEASE BURSA HIP Right 12/1984  .  HERNIA REPAIR  1985  . LEFT HEART CATHETERIZATION WITH CORONARY ANGIOGRAM N/A 12/09/2014   Procedure: LEFT HEART CATHETERIZATION WITH CORONARY ANGIOGRAM;  Surgeon: Burnell Blanks, MD;  Location: [facility] CATH LAB;  Service: Cardiovascular;  Laterality: N/A;  . TONSILLECTOMY AND ADENOIDECTOMY  ~ December 03, 1966  . TOTAL HIP ARTHROPLASTY Left 04-06-2014  . UMBILICAL HERNIA REPAIR  1985  . VENA CAVA FILTER PLACEMENT  03/2014   Family History  Problem Relation Age of Onset  . Stroke Mother        Deceased  . Lung cancer Father        Deceased  . Healthy Daughter   . Healthy Son        x 2  .  Other Son        Suicide  . Heart attack Neg Hx    Social History   Socioeconomic History  . Marital status: Divorced    Spouse name: Not on file  . Number of children: Not on file  . Years of education: Not on file  . Highest education level: Not on file  Social Needs  . Financial resource strain: Not on file  . Food insecurity - worry: Not on file  . Food insecurity - inability: Not on file  . Transportation needs - medical: Not on file  . Transportation needs - non-medical: Not on file  Occupational History  . Not on file  Tobacco Use  . Smoking status: Former Smoker    Packs/day: 1.50    Years: 10.00    Pack years: 15.00    Types: Cigarettes    Last attempt to quit: 04/12/1979    Years since quitting: 38.4  . Smokeless tobacco: Never Used  Substance and Sexual Activity  . Alcohol use: No    Alcohol/week: 0.0 oz  . Drug use: No  . Sexual activity: Not Currently  Other Topics Concern  . Not on file  Social History Narrative   Lives with fiancee in an apartment on the first floor.  Has 3 grown children.  One child passed away.  On disability December 03, 1994 - 2000, 2007 - current.     Education: some college.   Current Meds  Medication Sig  . carvedilol (COREG) 3.125 MG tablet Take 1 tablet (3.125 mg total) by mouth 2 (two) times daily with a meal.  . cyclobenzaprine (FLEXERIL) 10 MG tablet TAKE 1 TABLET BY MOUTH THREE TIMES A DAY AS NEEDED FOR MUSCLE SPASMS  . dicyclomine (BENTYL) 20 MG tablet TAKE 1 TABLET BY MOUTH 3 TIMES A DAY AS NEEDED FOR SPASMS  . diltiazem (CARDIZEM SR) 60 MG 12 hr capsule Take 1 capsule (60 mg total) by mouth every 12 (twelve) hours.  . diphenhydrAMINE (BENADRYL) 25 MG tablet Take 1 tablet (25 mg total) by mouth every 6 (six) hours. (Patient taking differently: Take 25 mg by mouth every 6 (six) hours. )  . ELIQUIS 5 MG TABS tablet TAKE 1 TABLET (5 MG TOTAL) BY MOUTH 2 (TWO) TIMES DAILY.  Marland Kitchen escitalopram (LEXAPRO) 20 MG tablet Take 1 tablet (20 mg total)  by mouth daily.  . furosemide (LASIX) 20 MG tablet Take 20 mg by mouth daily as needed.  . gabapentin (NEURONTIN) 600 MG tablet Take 1 tablet (600 mg total) by mouth 2 (two) times daily.  . isosorbide mononitrate (IMDUR) 30 MG 24 hr tablet Take 1 tablet (30 mg total) by mouth daily.  . magnesium 30 MG tablet Take 30 mg by mouth 2 (two) times daily.  Marland Kitchen  Multiple Vitamins-Minerals (SUPER THERA VITE M) TABS Take by mouth.  . OLANZapine (ZYPREXA) 10 MG tablet Take 1 tablet (10 mg total) by mouth at bedtime.  . ondansetron (ZOFRAN) 4 MG tablet Take 1 tablet (4 mg total) by mouth every 8 (eight) hours as needed for nausea or vomiting.  . pantoprazole (PROTONIX) 40 MG tablet TAKE 1 TABLET BY MOUTH EVERY DAY  . triamcinolone cream (KENALOG) 0.1 % Apply 1 application topically 2 (two) times daily.   Allergies  Allergen Reactions  . Abilify [Aripiprazole] Other (See Comments)    Jerking movement, slow motor skills  . Augmentin [Amoxicillin-Pot Clavulanate] Diarrhea and Nausea And Vomiting  . Bactrim [Sulfamethoxazole-Trimethoprim] Diarrhea  . Ceftin [Cefuroxime Axetil] Diarrhea  . Coconut Oil     Rash   . Diclofenac Other (See Comments)     Muscle Pain  . Dye Fdc Red [Red Dye]     "hair dye"  . Indocin [Indomethacin] Other (See Comments)    Migraines   . Linaclotide Diarrhea  . Lisinopril Diarrhea and Other (See Comments)    Other reaction(s): Dizziness, Vomiting  . Morphine Other (See Comments)    Chest Tightness  . Morphine And Related Other (See Comments)    Chest tightness   . Simvastatin Other (See Comments)    Muscle Pain  . Sulfa Antibiotics Diarrhea  . Sulfamethoxazole-Trimethoprim Diarrhea  . Wellbutrin [Bupropion] Other (See Comments)    Jerking movement Jerking movement Slurred speech  . Quetiapine Fumarate Palpitations  . Quetiapine Fumarate Palpitations   Recent Results (from the past 2160 hour(s))  POCT HgB A1C     Status: None   Collection Time: 05/29/17  3:11 PM    Result Value Ref Range   Hemoglobin A1C 7.9   Comprehensive metabolic panel     Status: Abnormal   Collection Time: 08/21/17 11:18 AM  Result Value Ref Range   Sodium 139 135 - 145 mEq/L   Potassium 3.7 3.5 - 5.1 mEq/L   Chloride 103 96 - 112 mEq/L   CO2 28 19 - 32 mEq/L   Glucose, Bld 152 (H) 70 - 99 mg/dL   BUN 8 6 - 23 mg/dL   Creatinine, Ser 0.96 0.40 - 1.20 mg/dL   Total Bilirubin 0.5 0.2 - 1.2 mg/dL   Alkaline Phosphatase 124 (H) 39 - 117 U/L   AST 22 0 - 37 U/L   ALT 23 0 - 35 U/L   Total Protein 7.8 6.0 - 8.3 g/dL   Albumin 4.3 3.5 - 5.2 g/dL   Calcium 9.5 8.4 - 10.5 mg/dL   GFR 75.40 >60.00 mL/min  CBC with Differential/Platelet     Status: Abnormal   Collection Time: 08/21/17 11:18 AM  Result Value Ref Range   WBC 6.2 4.0 - 10.5 K/uL   RBC 4.22 3.87 - 5.11 Mil/uL   Hemoglobin 12.8 12.0 - 15.0 g/dL   HCT 39.0 36.0 - 46.0 %   MCV 92.5 78.0 - 100.0 fl   MCHC 32.9 30.0 - 36.0 g/dL   RDW 14.8 11.5 - 15.5 %   Platelets 416.0 (H) 150.0 - 400.0 K/uL   Neutrophils Relative % 49.7 43.0 - 77.0 %   Lymphocytes Relative 38.6 12.0 - 46.0 %   Monocytes Relative 5.5 3.0 - 12.0 %   Eosinophils Relative 5.1 (H) 0.0 - 5.0 %   Basophils Relative 1.1 0.0 - 3.0 %   Neutro Abs 3.1 1.4 - 7.7 K/uL   Lymphs Abs 2.4 0.7 - 4.0 K/uL  Monocytes Absolute 0.3 0.1 - 1.0 K/uL   Eosinophils Absolute 0.3 0.0 - 0.7 K/uL   Basophils Absolute 0.1 0.0 - 0.1 K/uL  Urinalysis, Routine w reflex microscopic     Status: Abnormal   Collection Time: 08/21/17 11:18 AM  Result Value Ref Range   Color, Urine YELLOW Yellow;Lt. Yellow   APPearance CLEAR Clear   Specific Gravity, Urine >=1.030 (A) 1.000 - 1.030   pH 5.5 5.0 - 8.0   Total Protein, Urine TRACE (A) Negative   Urine Glucose NEGATIVE Negative   Ketones, ur NEGATIVE Negative   Bilirubin Urine SMALL (A) Negative   Hgb urine dipstick NEGATIVE Negative   Urobilinogen, UA 1.0 0.0 - 1.0   Leukocytes, UA NEGATIVE Negative   Nitrite NEGATIVE  Negative   WBC, UA 3-6/hpf (A) 0-2/hpf   RBC / HPF 0-2/hpf 0-2/hpf   Squamous Epithelial / LPF Many(>10/hpf) (A) Rare(0-4/hpf)   Bacteria, UA Many(>50/hpf) (A) None   Ca Oxalate Crys, UA Presence of (A) None  Lipid panel     Status: Abnormal   Collection Time: 08/21/17 11:18 AM  Result Value Ref Range   Cholesterol 304 (H) 0 - 200 mg/dL    Comment: ATP III Classification       Desirable:  < 200 mg/dL               Borderline High:  200 - 239 mg/dL          High:  > = 240 mg/dL   Triglycerides 190.0 (H) 0.0 - 149.0 mg/dL    Comment: Normal:  <150 mg/dLBorderline High:  150 - 199 mg/dL   HDL 38.70 (L) >39.00 mg/dL   VLDL 38.0 0.0 - 40.0 mg/dL   LDL Cholesterol 227 (H) 0 - 99 mg/dL   Total CHOL/HDL Ratio 8     Comment:                Men          Women1/2 Average Risk     3.4          3.3Average Risk          5.0          4.42X Average Risk          9.6          7.13X Average Risk          15.0          11.0                       NonHDL 265.15     Comment: NOTE:  Non-HDL goal should be 30 mg/dL higher than patient's LDL goal (i.e. LDL goal of < 70 mg/dL, would have non-HDL goal of < 100 mg/dL)  T4, free     Status: None   Collection Time: 08/21/17 11:18 AM  Result Value Ref Range   Free T4 0.81 0.60 - 1.60 ng/dL    Comment: Specimens from patients who are undergoing biotin therapy and /or ingesting biotin supplements may contain high levels of biotin.  The higher biotin concentration in these specimens interferes with this Free T4 assay.  Specimens that contain high levels  of biotin may cause false high results for this Free T4 assay.  Please interpret results in light of the total clinical presentation of the patient.    TSH     Status: Abnormal   Collection Time: 08/21/17 11:18 AM  Result Value  Ref Range   TSH 7.72 (H) 0.35 - 4.50 uIU/mL  D-Dimer, Quantitative     Status: None   Collection Time: 08/21/17 11:18 AM  Result Value Ref Range   D-Dimer, Quant 0.43 <0.50 mcg/mL FEU     Comment: . The D-Dimer test is used frequently to exclude an acute PE or DVT. In patients with a low to moderate clinical risk assessment and a D-Dimer result <0.50 mcg/mL FEU, the likelihood of a PE or DVT is very low. However, a thromboembolic event should not be excluded solely on the basis of the D-Dimer level. Increased levels of D-Dimer are associated with a PE, DVT, DIC, malignancies, inflammation, sepsis, surgery, trauma, pregnancy, and advancing patient age. [Jama 2006 11:295(2):199-207] . For additional information, please refer to: http://education.questdiagn[employer].com/faq/FAQ149 (This link is being provided for informational/ educational purposes only) .   Troponin I     Status: None   Collection Time: 08/21/17 11:55 AM  Result Value Ref Range   Troponin I <0.01 < OR = 0.0 ng/mL    Comment: . In accord with published recommendations, serial testing of troponin I at intervals of 2 to 4 hours for up to 12 to 24 hours is suggested in order to corroborate a single troponin I result. An elevated troponin alone is not sufficient to make the diagnosis of MI. .    Objective  Body mass index is 35.09 kg/m. Wt Readings from Last 3 Encounters:  08/21/17 217 lb 6 oz (98.6 kg)  08/19/17 219 lb 12.8 oz (99.7 kg)  06/24/17 221 lb 6.4 oz (100.4 kg)   Temp Readings from Last 3 Encounters:  08/21/17 98.2 F (36.8 C) (Oral)  08/19/17 98.6 F (37 C) (Oral)  06/24/17 98.3 F (36.8 C) (Oral)   BP Readings from Last 3 Encounters:  08/21/17 118/74  08/19/17 116/69  06/24/17 140/81   Pulse Readings from Last 3 Encounters:  08/21/17 94  08/19/17 91  06/24/17 (!) 112   O2 sat room air 94%   Physical Exam  Constitutional: She is oriented to person, place, and time and well-developed, well-nourished, and in no distress. Vital signs are normal.  HENT:  Head: Normocephalic and atraumatic.  Mouth/Throat: Oropharynx is clear and moist and mucous membranes are normal.    Eyes: Conjunctivae are normal. Pupils are equal, round, and reactive to light.  Cardiovascular: Normal rate, regular rhythm and normal heart sounds.  Pulmonary/Chest: Effort normal and breath sounds normal. She has no wheezes.  Abdominal: Soft. Bowel sounds are normal.  Musculoskeletal:       Arms: Neurological: She is alert and oriented to person, place, and time. Gait normal. Gait normal.  Skin: Skin is warm, dry and intact. Bruising noted.  Left lateral great toe with appearance of bruise Right great toe with appearance of bruise  W/o trauma but pt is on Eliquis   Psychiatric: Mood, memory, affect and judgment normal.  Nursing note and vitals reviewed.   Assessment   1. Pleuritic Chest pain radiating to right shoulder blade and SOB ? Etiology h/o PE on Eliquis and s/p IVC filter, also h/o b/l pleural effusions (follows with pulm and ANA neg 10/02/15, RF and antiCCP have also been negative in the past). Per chart review she also has h/o esophageal spasm and coronary vasospasm. EKG today with NSR, normal rate, no ST changes, TWI chronic reviewed prior EKGs. Of note she does have a h/o chronic shoulder pain and follows with [place] ortho.  2. H/o IBS mixed. 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 Nausea/vomiting/diarrea ? Etiology.  She is s/p GB removal prior CT ab/pelvis 09/30/16 neg. EGD 12/29/07 +chronic inactive gastritis  4. Ecchymoses b/l great toes likely 2/2 being on Eliquis w/o trauma  5. DM 2 off Jardiance 10 due to nausea last A1C 7.9 05/29/17  6. HLD/aortic atherosclerosis  7. HM  8. Abnormal thyroid labs elevated TSH, normal free T4   Plan  1.  Reviewed last echo see below  EKG today see above  Stat D dimer, trop today both negative  Stat CXR +trace b/l pleural effusions. Check TSH today hypothyroidism can cause pleural effusion per up to date  Refer back to cards Dr. Angelena Form for chest pain and sob  Already established with pulm for pleural effusions last note did not rec  intervention for pleural effusions and she may need for lasix if worsening sob per note 06/04/17. Echo 09/28/16 with 60-65% +LVH rec go to ED if worsening   2.  She does not like to take Miralax for constipation and rather take Colace per last GI note. Also disc Metamucil increase water and fiber intake  Diet rec for IBS  3.  CMET, CBC, UA today also checked top D dimer, lipid, TSH, T4.  Prn Zofran Established with GI If not better consider H pylori testing and US abdomen complete  protonix qhs for now and will add Sulcrafate qid   4. Monitor likely 2/2 anticoagulation despite no trauma   5.  A1C due 08/28/17 she is off Jardiance 2/2 side effect of nausea Will need another tx due to last A1C 7.9 05/29/17 at one point she was on novolog and Metformin 500 mg qd  Will modify therapy and consider add back Metformin if could prev. Tolerate and Januvia.   Will need urine protein checked in future microAlb/Creatinine  Will need foot and eye exams in the future   6. Could not tolerate a couple of statins in the past  Consider Zetia and lower intensity statin she has h/o aortic atherosclerosis vs Repatha (will defer Repatha back to cardiology)  Disc health diet and exercise as well.   7.  Need to disc vaccines at f/u flu, Tdap, shingrix, pna 23, prevnar) She does have h/o pneumonia  Pap no record on file due if has not had and GU organs intact. If GU organs removed only do pap if cervix intact or h/o abnormal pap  mammo overdue  Colonoscopy 02/17/17 ho serrated polyp neg pathology   Need to screen HIV, Hep B/C status and ask pt about any other Stds in future she would like to be screened for  Disc smoking status at f/u former/current.   8. Will have pt come back for free T3 anti TPO abs.     Provider: Dr. Olivia Mackie McLean-Scocuzza-Internal Medicine

## 2017-08-27 ENCOUNTER — Other Ambulatory Visit: Payer: Self-pay | Admitting: Internal Medicine

## 2017-08-27 ENCOUNTER — Telehealth: Payer: Self-pay | Admitting: Radiology

## 2017-08-27 DIAGNOSIS — R946 Abnormal results of thyroid function studies: Secondary | ICD-10-CM

## 2017-08-27 DIAGNOSIS — E119 Type 2 diabetes mellitus without complications: Secondary | ICD-10-CM

## 2017-08-27 DIAGNOSIS — E785 Hyperlipidemia, unspecified: Secondary | ICD-10-CM

## 2017-08-27 MED ORDER — EZETIMIBE 10 MG PO TABS: 10.0000 mg | ORAL_TABLET | Freq: Every day | ORAL | 0 refills | Status: DC

## 2017-08-27 NOTE — Telephone Encounter (Signed)
It appears this is related to a visit with Dr McLean-Scocuzza. I will forward to her to see what labs the patient needs.

## 2017-08-27 NOTE — Telephone Encounter (Signed)
Pt coming in for labs tomorrow, please place future orders. Thank you.  

## 2017-08-27 NOTE — Telephone Encounter (Signed)
Hi  FYI  I ordered free T3 and anti TPO antibodies TSH elevated in the 7 range Also I Rx Zetia since cholesterol is abnormal and myalgias with statin in the past.  At one pt she was on Pravachol not sure why this was d/c'ed  But she may be candidate for repatha which I indicated in my note and deferred to cards  Also not taking Jardiance I am asking her if she is agreeable to Januvia or Victoza but she needs to get back with me.  Thanks [employer]

## 2017-08-28 ENCOUNTER — Other Ambulatory Visit: Payer: Self-pay

## 2017-08-28 ENCOUNTER — Other Ambulatory Visit (INDEPENDENT_AMBULATORY_CARE_PROVIDER_SITE_OTHER): Payer: Medicare HMO

## 2017-08-28 ENCOUNTER — Other Ambulatory Visit: Payer: Self-pay | Admitting: Internal Medicine

## 2017-08-28 DIAGNOSIS — E119 Type 2 diabetes mellitus without complications: Secondary | ICD-10-CM

## 2017-08-28 DIAGNOSIS — R946 Abnormal results of thyroid function studies: Secondary | ICD-10-CM

## 2017-08-28 LAB — T3, FREE: T3, Free: 2.9 pg/mL (ref 2.3–4.2)

## 2017-08-28 LAB — HEMOGLOBIN A1C: Hgb A1c MFr Bld: 7.2 % — ABNORMAL HIGH (ref 4.6–6.5)

## 2017-08-28 MED ORDER — DICYCLOMINE HCL 20 MG PO TABS: ORAL_TABLET | ORAL | 0 refills | Status: DC

## 2017-08-28 MED ORDER — SITAGLIPTIN PHOSPHATE 50 MG PO TABS: 50.0000 mg | ORAL_TABLET | Freq: Every day | ORAL | 2 refills | Status: DC

## 2017-08-29 ENCOUNTER — Other Ambulatory Visit: Payer: Self-pay | Admitting: Family Medicine

## 2017-08-31 LAB — THYROID PEROXIDASE ANTIBODY: Thyroperoxidase Ab SerPl-aCnc: 882 IU/mL — ABNORMAL HIGH (ref <9)

## 2017-08-31 NOTE — Telephone Encounter (Signed)
Last OV 05/29/17 last filled 07/31/17 90 0rf

## 2017-09-02 NOTE — Progress Notes (Deleted)
     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  4) Chronic dyspnea - mild, does not appear to prevent ADL's 5) Very small bilateral pleural effusions - most likely diagnosis would be CHF  6) Possible diastolic heart failure   PLAN:  --Continue current medications.  --No need for intervention regarding pleural effusions, these appears small and are not causing significant dyspnea.  --She is asked to call back if she develops dyspnea, she may need to use lasix more often in that case.    PT PROFILE: 62 y.o. F with PMH of recurrent PE, prior PNA, small bilateral pleural effusions on recurrent CXRs referred for evaluation of chronic cough and DOE  HPI:  62 y.o. F last saw Dr. Alva Garnet here January 2017  with PMH of recurrent PE, prior PNA, small bilateral pleural effusions on recurrent CXRs referred for evaluation of chronic cough and DOE.   At her last visit, it was noted that she had recurrent PE, on lifelong Eliquis, rhonchi, cough, thought to be secondary to sinus drainage, deconditioning and possible diastolic congestive heart failure, airways disease appeared to be doubtful. She was encouraged to increase exercise level, sent for BMP, ESR, and a, trial of Nasacort nasal spray. These were not done. She remains on eliquis  She is referred back here today due to Ct chest which showed pleural effusion. She denies dyspnea on exertion. She denies the cough that she was originally seen for. She notes that she will occasionally wheeze at night. She does have reflux and takes protonix daily. She often eats before bedtime. She has lasix which she takes less than once per week for ankle swelling.  She tells me that she has been diagnosed with OSA, she was told to lose weight, this was about 1 year ago after a sleep study.   Echo 09/29/15 2017; LV EF: 60% -   65% Ct scan 02/23/17;  there were small bilateral pleural  effusions, enlarged pulmonary veins, both indicative of mild pulmonary venous hypertension, with volume overload. TTE 12/10/14:  LVEF 55-60%, concentric LVH, LA mildly dilated CT chest 09/19/14: Bilateral small pulmonary emboli without evidence of right heart strain these are new from the prior exam. No other focal abnormality is noted  CXR 08/17/15: Very small bilateral pleural effusions. No infiltrates. No overt pulmonary edema

## 2017-09-03 ENCOUNTER — Ambulatory Visit: Payer: Self-pay | Admitting: Internal Medicine

## 2017-09-03 ENCOUNTER — Other Ambulatory Visit: Payer: Self-pay | Admitting: Internal Medicine

## 2017-09-03 DIAGNOSIS — E063 Autoimmune thyroiditis: Secondary | ICD-10-CM

## 2017-09-03 MED ORDER — LEVOTHYROXINE SODIUM 25 MCG PO TABS: 25.0000 ug | ORAL_TABLET | Freq: Every day | ORAL | 1 refills | Status: DC

## 2017-09-04 ENCOUNTER — Other Ambulatory Visit: Payer: Self-pay | Admitting: Internal Medicine

## 2017-09-04 ENCOUNTER — Telehealth: Payer: Self-pay | Admitting: Family Medicine

## 2017-09-04 DIAGNOSIS — K219 Gastro-esophageal reflux disease without esophagitis: Secondary | ICD-10-CM

## 2017-09-04 MED ORDER — SUCRALFATE 1 G PO TABS: 1.0000 g | ORAL_TABLET | Freq: Three times a day (TID) | ORAL | 0 refills | Status: DC

## 2017-09-04 NOTE — Telephone Encounter (Signed)
-----   Message from Delorise Jackson, MD sent at 09/03/2017  6:48 PM EST ----- This patient has elevated TSH and anti TPO antibodies. 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  I think she would benefit from levothyroxine 25 mcg in am to start with dose titrated up based on TSH response. She will start Monday I think b/c she is out of town. TSH will need to be repeated in 6-8 weeks  Cause of pleural effusions could by underlying thyroid issues as I was reading   Thanks tMS

## 2017-09-04 NOTE — Telephone Encounter (Signed)
Please call the patient to see if she saw pulmonology for follow-up of her pleural effusions. It appears she missed the follow-up appointment. She needs to follow-up with them. Thanks.

## 2017-09-07 ENCOUNTER — Telehealth: Payer: Self-pay | Admitting: Internal Medicine

## 2017-09-07 NOTE — Telephone Encounter (Signed)
Patient had to cancel previous appt and does not feel an appt is necessary Her primary doctor suggested she reschedule Patient would like to know if based on her results a f/u appt is necessary Please call

## 2017-09-07 NOTE — Telephone Encounter (Signed)
Per patient request appt had been cancelled for 09-10-17.

## 2017-09-07 NOTE — Telephone Encounter (Signed)
Patient returned call for results, results given per Dr. Caryl Bis 09/04/17, patient verbalized understanding, states "I cancelled my appointment with pulmonology because I was out of town. I will call to re-schedule." Patient given her upcoming appointment with Dr. Terese Door scheduled for 09/18/17, verbalized understanding. I was unable to document in result notes due to no result note found.

## 2017-09-07 NOTE — Telephone Encounter (Signed)
fyi

## 2017-09-07 NOTE — Telephone Encounter (Signed)
Tried calling, no vm. [place] for pec to speak to patient

## 2017-09-09 ENCOUNTER — Encounter: Payer: Self-pay | Admitting: Physician Assistant

## 2017-09-09 ENCOUNTER — Ambulatory Visit: Payer: Self-pay | Admitting: Family Medicine

## 2017-09-09 ENCOUNTER — Ambulatory Visit: Payer: Medicare HMO | Admitting: Physician Assistant

## 2017-09-09 VITALS — BP 108/64 | HR 80 | Ht 66.0 in | Wt 217.4 lb

## 2017-09-09 DIAGNOSIS — R0602 Shortness of breath: Secondary | ICD-10-CM | POA: Diagnosis not present

## 2017-09-09 DIAGNOSIS — R079 Chest pain, unspecified: Secondary | ICD-10-CM

## 2017-09-09 DIAGNOSIS — I10 Essential (primary) hypertension: Secondary | ICD-10-CM

## 2017-09-09 DIAGNOSIS — I201 Angina pectoris with documented spasm: Secondary | ICD-10-CM

## 2017-09-09 DIAGNOSIS — Z86711 Personal history of pulmonary embolism: Secondary | ICD-10-CM | POA: Diagnosis not present

## 2017-09-09 MED ORDER — ISOSORBIDE MONONITRATE ER 30 MG PO TB24: 45.0000 mg | ORAL_TABLET | Freq: Every day | ORAL | 3 refills | Status: DC

## 2017-09-09 NOTE — Progress Notes (Signed)
Cardiology Office Note:    Date:  09/09/2017   ID:  Valerie Vazquez, DOB 1955-11-29, MRN [id number]  PCP:  [name], MD  Cardiologist:  Lauree Chandler, MD   Referring MD: McLean-Scocuzza, Olivia Mackie *   Chief Complaint  Patient presents with  . Chest Pain  . Shortness of Breath    History of Present Illness:    Valerie Vazquez is a 62 y.o. female with a hx of schizoaffective disorder, DM, HTN, diastolic CHF and multiple episodes of PE/DVT now on Eliquis with IVC filter. She was admitted to the hospital on 04/09-04/12 with chest pain. Her initial ECG was normal, but she had dynamic ECG changes with chest pain and code STEMI was called. She was taken to the Cath Lab.  LHC demonstrated no significant CAD.  CEs remained neg. She was started on Diltiazem and Imdur for possible vasospasm.     She was admitted to [facility] in January 2018 with chest pain.  Nuclear stress test demonstrated no ischemia.  Echocardiogram demonstrated normal LV function.  She was last seen by Dr. Angelena Form May 2018.  She was seen by primary care August 21, 2017 with chest pain.  Stat troponin and d-dimer were both negative.  Chest x-ray demonstrated trace bilateral effusions.  She was seen in the emergency room at [facility] 08/26/17 with chest pain.  Troponin was negative.  Chest x-ray demonstrated trace bilateral pleural effusions.  Valerie Vazquez returns for evaluation of chest discomfort and shortness of breath.  For several weeks, she has noted a heaviness in her chest.  This is not related to exertion.  It may last hours.  A few nights ago, she had a sharp pain that radiated to her left arm.  She notes worsening dyspnea with exertion.  She sleeps on 2 pillows.  She denies PND or edema.  She denies syncope.  She was concerned that she may need to take diuretics.  She did try furosemide once without relief of her symptoms.  She has a nonproductive cough.  She denies any fevers.  Prior CV studies:   The  following studies were reviewed today:  Chest x-ray 08/26/17 at [place] bilateral pleural effusions.   Nuclear stress test 09/29/16 Myocardial perfusion is normal. This is a low risk study. Overall left ventricular systolic function was normal. LV cavity size is normal. Nuclear stress EF: 50%. The left ventricular ejection fraction is mildly decreased (45-54%).   Echo 09/28/16 Mild LVH, EF 60-65  Echo 12/10/14 Mild concentric LVH, EF 55-60, normal wall motion, grade 1 diastolic dysfunction, mild LAE, mild TR  LHC 12/09/14 Left main: No obstructive disease Left Anterior Descending Artery: No obstructive disease.  Circumflex Artery: Large caliber dominant vessel with large first obtuse marginal branch, moderate caliber left posterolateral branch.  Right Coronary Artery: Small to moderate caliber non-dominant vessel with no obstructive disease.  Left Ventricular Angiogram: LVEF=50%.  Impression: 1. No angiographic evidence of CAD 2. Low normal LV systolic function 3. Chest pain with EKG changes, possibly representing coronary vasospasm  Past Medical History:  Diagnosis Date  . Anemia   . Bilateral renal cysts   . Chronic back pain    "upper and lower" (09/19/2014)  . Depression    "major" (09/19/2014)  . DVT (deep venous thrombosis) ([place])   . Esophageal spasm   . Gallstones   . Gastritis   . Gastroenteritis   . GERD (gastroesophageal reflux disease)   . Headache    "couple /month  lately" (09/19/2014)  . High blood pressure   . High cholesterol   . History of blood transfusion 1985   related to hysterectomy  . Hypertension   . IBS (irritable bowel syndrome)   . Migraine    "a few times/yr" (09/19/2014)  . Myocardial infarction ([place]) 10/2010 X 3   "while hospitalized"  . Osteoarthritis    "qwhere; mostly around my joints" (09/19/2014)  . Pneumonia 04/2014  . PTSD (post-traumatic stress disorder)   . Pulmonary embolism ([place]) 04/2001; 09/19/2014   "after gallbladder OR; "  .  Schizoaffective disorder ([place])   . Sleep apnea    "mild" (09/19/2014)  . Snoring    a. sleep study 5/16: No OSA  . Spinal stenosis   . Spondylosis   . Type II diabetes mellitus ([place])    "dx'd in 2007; lost weight; no RX for ~ 4 yr now" (09/19/2014)    Past Surgical History:  Procedure Laterality Date  . ABDOMINAL HYSTERECTOMY  1985  . ANTERIOR CERVICAL DECOMP/DISCECTOMY FUSION  10/2012  . APPENDECTOMY  1985  . BACK SURGERY    . BREAST CYST EXCISION Right   . BREAST EXCISIONAL BIOPSY Right YRS AGO   NEG  . CARDIAC CATHETERIZATION   2000's; 2009  . CHOLECYSTECTOMY  2002  . COLONOSCOPY WITH PROPOFOL N/A 12/12/2016   Procedure: COLONOSCOPY WITH PROPOFOL;  Surgeon: [name], MD;  Location: [facility] ENDOSCOPY;  Service: Endoscopy;  Laterality: N/A;  . COLONOSCOPY WITH PROPOFOL N/A 02/17/2017   Procedure: COLONOSCOPY WITH PROPOFOL;  Surgeon: [name], MD;  Location: [facility] ENDOSCOPY;  Service: Gastroenterology;  Laterality: N/A;  . ESOPHAGOGASTRODUODENOSCOPY (EGD) WITH PROPOFOL N/A 02/17/2017   Procedure: ESOPHAGOGASTRODUODENOSCOPY (EGD) WITH PROPOFOL;  Surgeon: [name], MD;  Location: [facility] ENDOSCOPY;  Service: Gastroenterology;  Laterality: N/A;  . EXCISION/RELEASE BURSA HIP Right 12/1984  . HERNIA REPAIR  1985  . LEFT HEART CATHETERIZATION WITH CORONARY ANGIOGRAM N/A 12/09/2014   Procedure: LEFT HEART CATHETERIZATION WITH CORONARY ANGIOGRAM;  Surgeon: Burnell Blanks, MD;  Location: [facility] CATH LAB;  Service: Cardiovascular;  Laterality: N/A;  . TONSILLECTOMY AND ADENOIDECTOMY  ~ 1968  . TOTAL HIP ARTHROPLASTY Left 04-06-2014  . UMBILICAL HERNIA REPAIR  1985  . VENA CAVA FILTER PLACEMENT  03/2014    Current Medications: Current Meds  Medication Sig  . carvedilol (COREG) 3.125 MG tablet Take 1 tablet (3.125 mg total) by mouth 2 (two) times daily with a meal.  . cyclobenzaprine (FLEXERIL) 10 MG tablet TAKE 1 TABLET BY MOUTH THREE TIMES A DAY AS NEEDED FOR MUSCLE SPASMS  . dicyclomine  (BENTYL) 20 MG tablet TAKE 1 TABLET BY MOUTH 3 TIMES A DAY AS NEEDED FOR SPASMS  . diltiazem (CARDIZEM) 60 MG tablet Take 60 mg by mouth 2 (two) times daily.  . diphenhydrAMINE (BENADRYL) 25 MG tablet Take 1 tablet (25 mg total) by mouth every 6 (six) hours.  Marland Kitchen ELIQUIS 5 MG TABS tablet TAKE 1 TABLET (5 MG TOTAL) BY MOUTH 2 (TWO) TIMES DAILY.  Marland Kitchen empagliflozin (JARDIANCE) 10 MG TABS tablet Take 10 mg by mouth daily.  Marland Kitchen escitalopram (LEXAPRO) 20 MG tablet Take 1 tablet (20 mg total) by mouth daily.  Marland Kitchen ezetimibe (ZETIA) 10 MG tablet Take 1 tablet (10 mg total) by mouth daily.  . furosemide (LASIX) 20 MG tablet Take 20 mg by mouth daily as needed.  . gabapentin (NEURONTIN) 600 MG tablet Take 1 tablet (600 mg total) by mouth 2 (two) times daily.  Marland Kitchen levothyroxine (SYNTHROID, LEVOTHROID) 25 MCG  tablet Take 1 tablet (25 mcg total) by mouth daily before breakfast. Wait 2-3 hours before taking any other medication or supplement  . magnesium 30 MG tablet Take 30 mg by mouth 2 (two) times daily.  . Multiple Vitamins-Minerals (SUPER THERA VITE M) TABS Take by mouth.  . neomycin-polymyxin b-dexamethasone (MAXITROL) 3.5-10000-0.1 OINT APPLY A SMALL AMOUNT IN RIGHT EYE 2 TIMES A DAY FOR 14 DAYS  . OLANZapine (ZYPREXA) 10 MG tablet Take 1 tablet (10 mg total) by mouth at bedtime.  . ondansetron (ZOFRAN) 4 MG tablet Take 1 tablet (4 mg total) by mouth every 8 (eight) hours as needed for nausea or vomiting.  . pantoprazole (PROTONIX) 40 MG tablet TAKE 1 TABLET BY MOUTH EVERY DAY  . sitaGLIPtin (JANUVIA) 50 MG tablet Take 1 tablet (50 mg total) by mouth daily.  . sucralfate (CARAFATE) 1 g tablet Take 1 tablet (1 g total) by mouth 4 (four) times daily -  with meals and at bedtime.  . triamcinolone cream (KENALOG) 0.1 % Apply 1 application topically 2 (two) times daily.  . [DISCONTINUED] isosorbide mononitrate (IMDUR) 30 MG 24 hr tablet Take 1 tablet (30 mg total) by mouth daily.     Allergies:   Abilify  [aripiprazole]; Augmentin [amoxicillin-pot clavulanate]; Bactrim [sulfamethoxazole-trimethoprim]; Ceftin [cefuroxime axetil]; Coconut oil; Diclofenac; Dye fdc red [red dye]; Indocin [indomethacin]; Linaclotide; Lisinopril; Morphine; Morphine and related; Simvastatin; Sulfa antibiotics; Sulfamethoxazole-trimethoprim; Wellbutrin [bupropion]; Quetiapine fumarate; and Quetiapine fumarate   Social History   Tobacco Use  . Smoking status: Former Smoker    Packs/day: 1.50    Years: 10.00    Pack years: 15.00    Types: Cigarettes    Last attempt to quit: 04/12/1979    Years since quitting: 38.4  . Smokeless tobacco: Never Used  Substance Use Topics  . Alcohol use: No    Alcohol/week: 0.0 oz  . Drug use: No     Family Hx: The patient's family history includes Healthy in her daughter and son; Lung cancer in her father; Other in her son; Stroke in her mother. There is no history of Heart attack.  ROS:   Please see the history of present illness.    Review of Systems  Constitution: Positive for decreased appetite.  Cardiovascular: Positive for chest pain.  Respiratory: Positive for cough.   Musculoskeletal: Positive for back pain.  Gastrointestinal: Positive for constipation, diarrhea and nausea.  Neurological: Positive for dizziness.  Psychiatric/Behavioral: Positive for depression.   All other systems reviewed and are negative.   EKGs/Labs/Other Test Reviewed:    EKG:  EKG is  ordered today.  The ekg ordered today demonstrates NSR, HR 80, normal axis, T wave inversions 3, aVF, V3-V6  Recent Labs: 02/23/2017: B Natriuretic Peptide 13.0 08/21/2017: ALT 23; BUN 8; Creatinine, Ser 0.96; Hemoglobin 12.8; Platelets 416.0; Potassium 3.7; Sodium 139; TSH 7.72   Recent Lipid Panel Lab Results  Component Value Date/Time   CHOL 304 (H) 08/21/2017 11:18 AM   TRIG 190.0 (H) 08/21/2017 11:18 AM   HDL 38.70 (L) 08/21/2017 11:18 AM   CHOLHDL 8 08/21/2017 11:18 AM   LDLCALC 227 (H) 08/21/2017  11:18 AM    Physical Exam:    VS:  BP 108/64   Pulse 80   Ht 5\' 6"  (1.676 m)   Wt 217 lb 6.4 oz (98.6 kg)   SpO2 98%   BMI 35.09 kg/m     Wt Readings from Last 3 Encounters:  09/09/17 217 lb 6.4 oz (98.6 kg)  08/21/17 217  lb 6 oz (98.6 kg)  08/19/17 219 lb 12.8 oz (99.7 kg)     Physical Exam  Constitutional: She is oriented to person, place, and time. She appears well-developed and well-nourished. No distress.  HENT:  Head: Normocephalic and atraumatic.  Neck: No JVD present.  Cardiovascular: Normal rate and regular rhythm.  No murmur heard. Pulmonary/Chest: Effort normal. She has no rales.  Abdominal: Soft. She exhibits no distension.  Musculoskeletal: She exhibits no edema.  Neurological: She is alert and oriented to person, place, and time.  Skin: Skin is warm and dry.    ASSESSMENT:    1. Chest pain, unspecified type   2. Shortness of breath   3. Coronary vasospasm (HCC)   4. Essential hypertension, benign   5. History of pulmonary embolus (PE)    PLAN:    In order of problems listed above:  1.  Chest pain, unspecified type She has atypical chest discomfort.  Cardiac catheterization 2016 did not demonstrate significant CAD.  She had a stress test in January 2018 that was low risk.  She has had a recent trip to the emergency room with negative workup.  She does not really display any signs of volume excess on exam.  Her ECG does demonstrate inferolateral T wave inversions.  She has had some suggestion of this in the past on prior EKGs.  T wave changes look more prominent on her ECG today.  She has been treated for vasospasm in the past.  I will adjust her antianginals.  As she is a diabetic and it has been a year since her last stress test, I will also arrange stress testing.  -Increase isosorbide to 45 mg daily  -Continue diltiazem, nitrates  2. Shortness of breath   She notes dyspnea with exertion.  Proceed with stress testing as outlined above and adjust  nitrates.  She does not appear to be volume overloaded on exam.  She does have a history of diastolic dysfunction on echocardiogram in the past.  Obtain Pro b natriuretic peptide.  If elevated, start furosemide.  3. Coronary vasospasm (HCC)  Continue calcium channel blocker and nitrates.  4. Essential hypertension, benign The patient's blood pressure is controlled on her current regimen.  Continue current therapy.   5. History of pulmonary embolus (PE) Recent d-dimer negative.  Continue Apixaban.    Dispo:  Return in about 4 weeks (around 10/07/2017) for Close Follow Up w/ Dr. Angelena Form.   Medication Adjustments/Labs and Tests Ordered: Current medicines are reviewed at length with the patient today.  Concerns regarding medicines are outlined above.  Tests Ordered: Orders Placed This Encounter  Procedures  . Pro b natriuretic peptide  . Myocardial Perfusion Imaging  . EKG 12-Lead   Medication Changes: Meds ordered this encounter  Medications  . isosorbide mononitrate (IMDUR) 30 MG 24 hr tablet    Sig: Take 1.5 tablets (45 mg total) by mouth daily.    Dispense:  135 tablet    Refill:  3    Signed, Richardson Dopp, PA-C  09/09/2017 1:28 PM    [facility] [address], [place], [place]  [address] Phone: [phone]; Fax: [phone]

## 2017-09-09 NOTE — Patient Instructions (Signed)
Medication Instructions:  1. INCREASE IMDUR TO 45 MG DAILY; THIS WILL BE 1 AND 1/2 TABLETS DAILY OF THE 30 MG TABLET; NEW RX HAS BEEN SENT IN WITH THE NEW DIRECTIONS  Labwork: PRO BNP TODAY  Testing/Procedures: Your physician has requested that you have a lexiscan myoview. For further information please visit [email or web address]. Please follow instruction sheet, as given.    Follow-Up: DR. Angelena Form IN 3-4 WEEKS  Any Other Special Instructions Will Be Listed Below (If Applicable).     If you need a refill on your cardiac medications before your next appointment, please call your pharmacy.

## 2017-09-10 ENCOUNTER — Ambulatory Visit: Payer: Self-pay | Admitting: Internal Medicine

## 2017-09-10 ENCOUNTER — Telehealth (HOSPITAL_COMMUNITY): Payer: Self-pay | Admitting: *Deleted

## 2017-09-10 LAB — PRO B NATRIURETIC PEPTIDE: NT-Pro BNP: 78 pg/mL (ref 0–287)

## 2017-09-10 NOTE — Telephone Encounter (Signed)
Left message on voicemail per DPR in reference to upcoming appointment scheduled on 09/15/17 with detailed instructions given per Myocardial Perfusion Study Information Sheet for the test. LM to arrive 15 minutes early, and that it is imperative to arrive on time for appointment to keep from having the test rescheduled. If you need to cancel or reschedule your appointment, please call the office within 24 hours of your appointment. Failure to do so may result in a cancellation of your appointment, and a $50 no show fee. Phone number given for call back for any questions. Kirstie Peri

## 2017-09-11 ENCOUNTER — Telehealth: Payer: Self-pay

## 2017-09-11 NOTE — Telephone Encounter (Signed)
Advised patient of recent lab results with verbal understanding.   

## 2017-09-11 NOTE — Telephone Encounter (Signed)
-----   Message from Liliane Shi, [place] sent at 09/10/2017 10:43 AM EST ----- Please call the patient BNP normal.  Continue current medications. Richardson Dopp, PA-C 09/10/2017 10:42 AM

## 2017-09-15 ENCOUNTER — Ambulatory Visit (HOSPITAL_COMMUNITY): Payer: Medicare HMO | Attending: Cardiology

## 2017-09-15 DIAGNOSIS — I10 Essential (primary) hypertension: Secondary | ICD-10-CM | POA: Diagnosis not present

## 2017-09-15 DIAGNOSIS — R079 Chest pain, unspecified: Secondary | ICD-10-CM | POA: Diagnosis not present

## 2017-09-15 DIAGNOSIS — E119 Type 2 diabetes mellitus without complications: Secondary | ICD-10-CM | POA: Insufficient documentation

## 2017-09-15 DIAGNOSIS — R0602 Shortness of breath: Secondary | ICD-10-CM | POA: Insufficient documentation

## 2017-09-15 DIAGNOSIS — R0609 Other forms of dyspnea: Secondary | ICD-10-CM | POA: Insufficient documentation

## 2017-09-15 DIAGNOSIS — I2510 Atherosclerotic heart disease of native coronary artery without angina pectoris: Secondary | ICD-10-CM | POA: Diagnosis not present

## 2017-09-15 LAB — MYOCARDIAL PERFUSION IMAGING
LV dias vol: 95 mL (ref 46–106)
LV sys vol: 34 mL
Peak HR: 96 {beats}/min
RATE: 0.33
Rest HR: 77 {beats}/min
SDS: 0
SRS: 3
SSS: 3
TID: 0.93

## 2017-09-15 MED ORDER — REGADENOSON 0.4 MG/5ML IV SOLN
0.4000 mg | Freq: Once | INTRAVENOUS | Status: AC
  Administered 2017-09-15: 0.4 mg via INTRAVENOUS

## 2017-09-15 MED ORDER — TECHNETIUM TC 99M TETROFOSMIN IV KIT
32.4000 | PACK | Freq: Once | INTRAVENOUS | Status: AC | PRN
  Administered 2017-09-15: 32.4 via INTRAVENOUS
  Filled 2017-09-15: qty 33

## 2017-09-15 MED ORDER — TECHNETIUM TC 99M TETROFOSMIN IV KIT
10.8000 | PACK | Freq: Once | INTRAVENOUS | Status: AC | PRN
  Administered 2017-09-15: 10.8 via INTRAVENOUS
  Filled 2017-09-15: qty 11

## 2017-09-16 ENCOUNTER — Telehealth: Payer: Self-pay | Admitting: *Deleted

## 2017-09-16 ENCOUNTER — Encounter: Payer: Self-pay | Admitting: Physician Assistant

## 2017-09-16 NOTE — Telephone Encounter (Signed)
-----   Message from Liliane Shi, [place] sent at 09/16/2017  8:05 AM EST ----- Please call the patient. The stress test is normal.  Continue current treatment plan and follow up as planned. Please fax a copy of this study result to her PCP:  [name], MD  Thanks! Richardson Dopp, PA-C    09/16/2017 8:04 AM

## 2017-09-16 NOTE — Telephone Encounter (Signed)
Tried to reach the pt to go over Myoview results, though could not lmom.

## 2017-09-18 ENCOUNTER — Encounter: Payer: Self-pay | Admitting: Internal Medicine

## 2017-09-18 ENCOUNTER — Ambulatory Visit (INDEPENDENT_AMBULATORY_CARE_PROVIDER_SITE_OTHER): Payer: Medicare HMO | Admitting: Internal Medicine

## 2017-09-18 VITALS — BP 118/80 | HR 110 | Temp 98.2°F | Resp 16 | Ht 66.0 in | Wt 219.1 lb

## 2017-09-18 DIAGNOSIS — R110 Nausea: Secondary | ICD-10-CM

## 2017-09-18 DIAGNOSIS — Z1231 Encounter for screening mammogram for malignant neoplasm of breast: Secondary | ICD-10-CM

## 2017-09-18 DIAGNOSIS — E785 Hyperlipidemia, unspecified: Secondary | ICD-10-CM

## 2017-09-18 DIAGNOSIS — K224 Dyskinesia of esophagus: Secondary | ICD-10-CM | POA: Diagnosis not present

## 2017-09-18 DIAGNOSIS — R112 Nausea with vomiting, unspecified: Secondary | ICD-10-CM | POA: Diagnosis not present

## 2017-09-18 DIAGNOSIS — IMO0002 Reserved for concepts with insufficient information to code with codable children: Secondary | ICD-10-CM

## 2017-09-18 DIAGNOSIS — Z9189 Other specified personal risk factors, not elsewhere classified: Secondary | ICD-10-CM | POA: Diagnosis not present

## 2017-09-18 DIAGNOSIS — K219 Gastro-esophageal reflux disease without esophagitis: Secondary | ICD-10-CM

## 2017-09-18 DIAGNOSIS — E119 Type 2 diabetes mellitus without complications: Secondary | ICD-10-CM

## 2017-09-18 MED ORDER — NIFEDIPINE 10 MG PO CAPS: 10.0000 mg | ORAL_CAPSULE | Freq: Two times a day (BID) | ORAL | 0 refills | Status: DC

## 2017-09-18 MED ORDER — ONDANSETRON HCL 4 MG PO TABS: 4.0000 mg | ORAL_TABLET | Freq: Two times a day (BID) | ORAL | 1 refills | Status: DC | PRN

## 2017-09-18 MED ORDER — PRAVASTATIN SODIUM 10 MG PO TABS: 10.0000 mg | ORAL_TABLET | Freq: Every day | ORAL | 1 refills | Status: DC

## 2017-09-18 NOTE — Patient Instructions (Addendum)
I will refer you to [facility] for left toenail problem  Try Pravastatin 10 mg at night for cholesterol  Try Nifedipine 30 minutes before meals up to 2x per day for esophageal spasm  -when Diltiazem comes back stop Nifedipine  Follow up in 3 months sooner if needed  Referred for mammogram at [facility]     Esophageal Spasm An esophageal spasm is a muscle spasm of the tube that connects the back of your throat to your stomach (esophagus). Your esophagus normally moves food and liquids down into your stomach with smooth, wavelike muscle contractions. If you have esophageal spasms, abnormal muscle contractions in your esophagus can be painful and cause you to have difficulty swallowing (dysphagia). There are two types of esophageal spasms. You may have one or both types:  Diffuse esophageal spasms. These are irregular, uncoordinated muscle movements of the esophagus. The diffuse type causes more dysphagia.  Nutcracker esophagus. This is a type of muscle contraction that is coordinated but too strong. The muscles move in a regular order, but the contraction is stronger than necessary. The nutcracker type of esophageal spasm is more painful.  Severe cases of esophageal spasm can make it hard to eat well and do all your usual activities. Esophageal spasms often occur along with severe heartburn (reflux esophagitis). Swallowed foods or liquids may also come back up into your throat (regurgitation). What are the causes? The cause of esophageal spasm is not known. What increases the risk? You may have a higher risk for esophageal spasm if you:  Are female.  Are an older person.  Eat very quickly.  Swallow foods or drinks that are very hot or very cold.  Are depressed or anxious.  What are the signs or symptoms? Signs and symptoms can vary from day to day. They may be mild or severe. They may last for minutes or hours. Common signs and symptoms include:  Chest pain. This may feel like a  heart attack.  Back pain.  Dysphagia.  Heartburn.  The feeling that something is stuck in your throat (globus).  Regurgitation of foods or liquids.  How is this diagnosed? Your health care provider may suspect esophageal spasm based on your symptoms and a physical exam. Tests may be done to help confirm the diagnosis. These may include:  Endoscopy. A flexible telescope is passed into your esophagus.  Barium swallow. An X-ray is done while you drink a substance (contrast material) that shows up on X-ray.  Esophageal manometry. Pressure measurements of the inside of your esophagus are taken while you swallow.  How is this treated? Mild cases of esophageal spasms may not need to be treated. You may be able to manage the spasms by avoiding the foods and eating habits that trigger them. Treatment for spasms that are more severe or frequent can include the following:  You may be given medicine to: ? Relax the esophageal muscles. ? Relieve muscle spasms (calcium channel blockers and nitrates). ? Block nerve endings in the esophagus. This is done with an injection of a certain toxin (botulinum). ? Relieve heartburn (proton pump inhibitors).  Antidepressant medicines are sometimes used to ease symptoms.  For severe cases, surgery is sometimes done to reduce esophageal muscle contractions (myotomy).  Follow these instructions at home:  Take medicines only as directed by your health care provider.  Do not eat foods that trigger heartburn or esophageal spasms.  Eat your meals slowly.  Chew your food completely.  Avoid foods and drinks that are very  hot or very cold.  Find ways to manage stress.  Ask for help if you struggle with depression or anxiety.  Keep all follow-up visits as directed by your health care provider. This is important. Contact a health care provider if:  Your symptoms change or get worse.  You are losing weight because of dysphagia.  Your medicine is  not helping your symptoms.  Your esophageal spasms are interfering with your quality of life. Get help right away if:  You have very bad or unusual chest pain.  You have trouble breathing.  You choke. This information is not intended to replace advice given to you by your health care provider. Make sure you discuss any questions you have with your health care provider. Document Released: 11/08/2002 Document Revised: 01/24/2016 Document Reviewed: 11/11/2013 Elsevier Interactive Patient Education  [employer].

## 2017-09-18 NOTE — Telephone Encounter (Signed)
Late entry: I s/w pt 1/17 who has been notified of Myoview results by phone with verbal understanding. Pt thanked me for my call. I will forward results to PCP as well. ------

## 2017-09-21 ENCOUNTER — Encounter: Payer: Self-pay | Admitting: Internal Medicine

## 2017-09-21 NOTE — Progress Notes (Signed)
Chief Complaint  Patient presents with  . Follow-up   Follow up  1. Still c/o left great toe bruise and brown streaks in right toe though bruising better. She denies trauma but she is on Eliquis  2. No CP today saw cardiology and stress test normal She does have h/o esophageal spasm and Diltazem 60 mg she has been out x 1 week b/c on back order and she wants to know what she can do instead  GERD sx's uncontrolled on protonix she has not started taking sulcrafate  3. HLD tried Zetia but gave diarrhea she was on pravastatin in the past and could not rememeber if had side effects  4. DM 2 she is compliant with Januvia 50 mg qd last A1C 7.2 08/28/17    Review of Systems  Constitutional: Negative for weight loss.  HENT: Negative for hearing loss.   Respiratory: Negative for shortness of breath.   Cardiovascular: Negative for chest pain.  Gastrointestinal:       +esophageal spasm   Skin:       +changes on toenails    Past Medical History:  Diagnosis Date  . Anemia   . Bilateral renal cysts   . Chronic back pain    "upper and lower" (09/19/2014)  . Depression    "major" (09/19/2014)  . DVT (deep venous thrombosis) ([place])   . Esophageal spasm   . Gallstones   . Gastritis   . Gastroenteritis   . GERD (gastroesophageal reflux disease)   . Headache    "couple /month lately" (09/19/2014)  . High blood pressure   . High cholesterol   . History of blood transfusion 1985   related to hysterectomy  . History of nuclear stress test    Myoview 1/19:  EF 65, no ischemia or scar  . Hypertension   . IBS (irritable bowel syndrome)   . Migraine    "a few times/yr" (09/19/2014)  . Myocardial infarction ([place]) 10/2010 X 3   "while hospitalized"  . Osteoarthritis    "qwhere; mostly around my joints" (09/19/2014)  . Pneumonia 04/2014  . PTSD (post-traumatic stress disorder)   . Pulmonary embolism ([place]) 04/2001; 09/19/2014   "after gallbladder OR; "  . Schizoaffective disorder ([place])   . Sleep  apnea    "mild" (09/19/2014)  . Snoring    a. sleep study 5/16: No OSA  . Spinal stenosis   . Spondylosis   . Type II diabetes mellitus ([place])    "dx'd in 2007; lost weight; no RX for ~ 4 yr now" (09/19/2014)   Past Surgical History:  Procedure Laterality Date  . ABDOMINAL HYSTERECTOMY  1985  . ANTERIOR CERVICAL DECOMP/DISCECTOMY FUSION  10/2012  . APPENDECTOMY  1985  . BACK SURGERY    . BREAST CYST EXCISION Right   . BREAST EXCISIONAL BIOPSY Right YRS AGO   NEG  . CARDIAC CATHETERIZATION   2000's; 2009  . CHOLECYSTECTOMY  2002  . COLONOSCOPY WITH PROPOFOL N/A 12/12/2016   Procedure: COLONOSCOPY WITH PROPOFOL;  Surgeon: [name], MD;  Location: [facility] ENDOSCOPY;  Service: Endoscopy;  Laterality: N/A;  . COLONOSCOPY WITH PROPOFOL N/A 02/17/2017   Procedure: COLONOSCOPY WITH PROPOFOL;  Surgeon: [name], MD;  Location: [facility] ENDOSCOPY;  Service: Gastroenterology;  Laterality: N/A;  . ESOPHAGOGASTRODUODENOSCOPY (EGD) WITH PROPOFOL N/A 02/17/2017   Procedure: ESOPHAGOGASTRODUODENOSCOPY (EGD) WITH PROPOFOL;  Surgeon: [name], MD;  Location: [facility] ENDOSCOPY;  Service: Gastroenterology;  Laterality: N/A;  . EXCISION/RELEASE BURSA HIP Right 12/1984  . HERNIA REPAIR  1985  . LEFT HEART CATHETERIZATION WITH CORONARY ANGIOGRAM N/A 12/09/2014   Procedure: LEFT HEART CATHETERIZATION WITH CORONARY ANGIOGRAM;  Surgeon: Burnell Blanks, MD;  Location: [facility] CATH LAB;  Service: Cardiovascular;  Laterality: N/A;  . TONSILLECTOMY AND ADENOIDECTOMY  ~ 1966-12-22  . TOTAL HIP ARTHROPLASTY Left 04-06-2014  . UMBILICAL HERNIA REPAIR  1985  . VENA CAVA FILTER PLACEMENT  03/2014   Family History  Problem Relation Age of Onset  . Stroke Mother        Deceased  . Lung cancer Father        Deceased  . Healthy Daughter   . Healthy Son        x 2  . Other Son        Suicide  . Heart attack Neg Hx    Social History   Socioeconomic History  . Marital status: Divorced    Spouse name: Not on file  .  Number of children: Not on file  . Years of education: Not on file  . Highest education level: Not on file  Social Needs  . Financial resource strain: Not on file  . Food insecurity - worry: Not on file  . Food insecurity - inability: Not on file  . Transportation needs - medical: Not on file  . Transportation needs - non-medical: Not on file  Occupational History  . Not on file  Tobacco Use  . Smoking status: Former Smoker    Packs/day: 1.50    Years: 10.00    Pack years: 15.00    Types: Cigarettes    Last attempt to quit: 04/12/1979    Years since quitting: 38.4  . Smokeless tobacco: Never Used  Substance and Sexual Activity  . Alcohol use: No    Alcohol/week: 0.0 oz  . Drug use: No  . Sexual activity: Not Currently  Other Topics Concern  . Not on file  Social History Narrative   Lives with fiancee in an apartment on the first floor.  Has 3 grown children.  One child passed away.  On disability Dec 22, 1994 - 2000, 2007 - current.     Education: some college.   Current Meds  Medication Sig  . carvedilol (COREG) 3.125 MG tablet Take 1 tablet (3.125 mg total) by mouth 2 (two) times daily with a meal.  . cyclobenzaprine (FLEXERIL) 10 MG tablet TAKE 1 TABLET BY MOUTH THREE TIMES A DAY AS NEEDED FOR MUSCLE SPASMS  . dicyclomine (BENTYL) 20 MG tablet TAKE 1 TABLET BY MOUTH 3 TIMES A DAY AS NEEDED FOR SPASMS  . diltiazem (CARDIZEM) 60 MG tablet Take 60 mg by mouth 2 (two) times daily.  Marland Kitchen ELIQUIS 5 MG TABS tablet TAKE 1 TABLET (5 MG TOTAL) BY MOUTH 2 (TWO) TIMES DAILY.  Marland Kitchen escitalopram (LEXAPRO) 20 MG tablet Take 1 tablet (20 mg total) by mouth daily.  . furosemide (LASIX) 20 MG tablet Take 20 mg by mouth daily as needed.  . gabapentin (NEURONTIN) 600 MG tablet Take 1 tablet (600 mg total) by mouth 2 (two) times daily.  . isosorbide mononitrate (IMDUR) 30 MG 24 hr tablet Take 1.5 tablets (45 mg total) by mouth daily.  Marland Kitchen levothyroxine (SYNTHROID, LEVOTHROID) 25 MCG tablet Take 1 tablet (25  mcg total) by mouth daily before breakfast. Wait 2-3 hours before taking any other medication or supplement  . Multiple Vitamins-Minerals (SUPER THERA VITE M) TABS Take by mouth.  . neomycin-polymyxin b-dexamethasone (MAXITROL) 3.5-10000-0.1 OINT APPLY A SMALL AMOUNT IN RIGHT EYE 2  TIMES A DAY FOR 14 DAYS  . OLANZapine (ZYPREXA) 10 MG tablet Take 1 tablet (10 mg total) by mouth at bedtime.  . pantoprazole (PROTONIX) 40 MG tablet TAKE 1 TABLET BY MOUTH EVERY DAY  . sitaGLIPtin (JANUVIA) 50 MG tablet Take 1 tablet (50 mg total) by mouth daily.  . sucralfate (CARAFATE) 1 g tablet Take 1 tablet (1 g total) by mouth 4 (four) times daily -  with meals and at bedtime.  . triamcinolone cream (KENALOG) 0.1 % Apply 1 application topically 2 (two) times daily.   Allergies  Allergen Reactions  . Abilify [Aripiprazole] Other (See Comments)    Jerking movement, slow motor skills  . Augmentin [Amoxicillin-Pot Clavulanate] Diarrhea and Nausea And Vomiting  . Bactrim [Sulfamethoxazole-Trimethoprim] Diarrhea  . Ceftin [Cefuroxime Axetil] Diarrhea  . Coconut Oil     Rash   . Diclofenac Other (See Comments)     Muscle Pain  . Dye Fdc Red [Red Dye]     "hair dye"  . Indocin [Indomethacin] Other (See Comments)    Migraines   . Linaclotide Diarrhea  . Lisinopril Diarrhea and Other (See Comments)    Other reaction(s): Dizziness, Vomiting  . Morphine Other (See Comments)    Chest Tightness  . Morphine And Related Other (See Comments)    Chest tightness   . Simvastatin Other (See Comments)    Muscle Pain  . Sulfa Antibiotics Diarrhea  . Sulfamethoxazole-Trimethoprim Diarrhea  . Wellbutrin [Bupropion] Other (See Comments)    Jerking movement Jerking movement Slurred speech  . Zetia [Ezetimibe]     Diarrhea   . Quetiapine Fumarate Palpitations  . Quetiapine Fumarate Palpitations   Recent Results (from the past 2160 hour(s))  Comprehensive metabolic panel     Status: Abnormal   Collection Time:  08/21/17 11:18 AM  Result Value Ref Range   Sodium 139 135 - 145 mEq/L   Potassium 3.7 3.5 - 5.1 mEq/L   Chloride 103 96 - 112 mEq/L   CO2 28 19 - 32 mEq/L   Glucose, Bld 152 (H) 70 - 99 mg/dL   BUN 8 6 - 23 mg/dL   Creatinine, Ser 0.96 0.40 - 1.20 mg/dL   Total Bilirubin 0.5 0.2 - 1.2 mg/dL   Alkaline Phosphatase 124 (H) 39 - 117 U/L   AST 22 0 - 37 U/L   ALT 23 0 - 35 U/L   Total Protein 7.8 6.0 - 8.3 g/dL   Albumin 4.3 3.5 - 5.2 g/dL   Calcium 9.5 8.4 - 10.5 mg/dL   GFR 75.40 >60.00 mL/min  CBC with Differential/Platelet     Status: Abnormal   Collection Time: 08/21/17 11:18 AM  Result Value Ref Range   WBC 6.2 4.0 - 10.5 K/uL   RBC 4.22 3.87 - 5.11 Mil/uL   Hemoglobin 12.8 12.0 - 15.0 g/dL   HCT 39.0 36.0 - 46.0 %   MCV 92.5 78.0 - 100.0 fl   MCHC 32.9 30.0 - 36.0 g/dL   RDW 14.8 11.5 - 15.5 %   Platelets 416.0 (H) 150.0 - 400.0 K/uL   Neutrophils Relative % 49.7 43.0 - 77.0 %   Lymphocytes Relative 38.6 12.0 - 46.0 %   Monocytes Relative 5.5 3.0 - 12.0 %   Eosinophils Relative 5.1 (H) 0.0 - 5.0 %   Basophils Relative 1.1 0.0 - 3.0 %   Neutro Abs 3.1 1.4 - 7.7 K/uL   Lymphs Abs 2.4 0.7 - 4.0 K/uL   Monocytes Absolute 0.3 0.1 - 1.0  K/uL   Eosinophils Absolute 0.3 0.0 - 0.7 K/uL   Basophils Absolute 0.1 0.0 - 0.1 K/uL  Urinalysis, Routine w reflex microscopic     Status: Abnormal   Collection Time: 08/21/17 11:18 AM  Result Value Ref Range   Color, Urine YELLOW Yellow;Lt. Yellow   APPearance CLEAR Clear   Specific Gravity, Urine >=1.030 (A) 1.000 - 1.030   pH 5.5 5.0 - 8.0   Total Protein, Urine TRACE (A) Negative   Urine Glucose NEGATIVE Negative   Ketones, ur NEGATIVE Negative   Bilirubin Urine SMALL (A) Negative   Hgb urine dipstick NEGATIVE Negative   Urobilinogen, UA 1.0 0.0 - 1.0   Leukocytes, UA NEGATIVE Negative   Nitrite NEGATIVE Negative   WBC, UA 3-6/hpf (A) 0-2/hpf   RBC / HPF 0-2/hpf 0-2/hpf   Squamous Epithelial / LPF Many(>10/hpf) (A)  Rare(0-4/hpf)   Bacteria, UA Many(>50/hpf) (A) None   Ca Oxalate Crys, UA Presence of (A) None  Lipid panel     Status: Abnormal   Collection Time: 08/21/17 11:18 AM  Result Value Ref Range   Cholesterol 304 (H) 0 - 200 mg/dL    Comment: ATP III Classification       Desirable:  < 200 mg/dL               Borderline High:  200 - 239 mg/dL          High:  > = 240 mg/dL   Triglycerides 190.0 (H) 0.0 - 149.0 mg/dL    Comment: Normal:  <150 mg/dLBorderline High:  150 - 199 mg/dL   HDL 38.70 (L) >39.00 mg/dL   VLDL 38.0 0.0 - 40.0 mg/dL   LDL Cholesterol 227 (H) 0 - 99 mg/dL   Total CHOL/HDL Ratio 8     Comment:                Men          Women1/2 Average Risk     3.4          3.3Average Risk          5.0          4.42X Average Risk          9.6          7.13X Average Risk          15.0          11.0                       NonHDL 265.15     Comment: NOTE:  Non-HDL goal should be 30 mg/dL higher than patient's LDL goal (i.e. LDL goal of < 70 mg/dL, would have non-HDL goal of < 100 mg/dL)  T4, free     Status: None   Collection Time: 08/21/17 11:18 AM  Result Value Ref Range   Free T4 0.81 0.60 - 1.60 ng/dL    Comment: Specimens from patients who are undergoing biotin therapy and /or ingesting biotin supplements may contain high levels of biotin.  The higher biotin concentration in these specimens interferes with this Free T4 assay.  Specimens that contain high levels  of biotin may cause false high results for this Free T4 assay.  Please interpret results in light of the total clinical presentation of the patient.    TSH     Status: Abnormal   Collection Time: 08/21/17 11:18 AM  Result Value Ref Range   TSH 7.72 (  H) 0.35 - 4.50 uIU/mL  D-Dimer, Quantitative     Status: None   Collection Time: 08/21/17 11:18 AM  Result Value Ref Range   D-Dimer, Quant 0.43 <0.50 mcg/mL FEU    Comment: . The D-Dimer test is used frequently to exclude an acute PE or DVT. In patients with a low to moderate  clinical risk assessment and a D-Dimer result <0.50 mcg/mL FEU, the likelihood of a PE or DVT is very low. However, a thromboembolic event should not be excluded solely on the basis of the D-Dimer level. Increased levels of D-Dimer are associated with a PE, DVT, DIC, malignancies, inflammation, sepsis, surgery, trauma, pregnancy, and advancing patient age. [Jama 2006 11:295(2):199-207] . For additional information, please refer to: http://education.[facility].com/faq/FAQ149 (This link is being provided for informational/ educational purposes only) .   Troponin I     Status: None   Collection Time: 08/21/17 11:55 AM  Result Value Ref Range   Troponin I <0.01 < OR = 0.0 ng/mL    Comment: . In accord with published recommendations, serial testing of troponin I at intervals of 2 to 4 hours for up to 12 to 24 hours is suggested in order to corroborate a single troponin I result. An elevated troponin alone is not sufficient to make the diagnosis of MI. Marland Kitchen   Hemoglobin A1c     Status: Abnormal   Collection Time: 08/28/17 10:35 AM  Result Value Ref Range   Hgb A1c MFr Bld 7.2 (H) 4.6 - 6.5 %    Comment: Glycemic Control Guidelines for People with Diabetes:Non Diabetic:  <6%Goal of Therapy: <7%Additional Action Suggested:  >8%   Thyroid peroxidase antibody     Status: Abnormal   Collection Time: 08/28/17 10:35 AM  Result Value Ref Range   Thyroperoxidase Ab SerPl-aCnc 882 (H) <9 IU/mL  T3, free     Status: None   Collection Time: 08/28/17 10:35 AM  Result Value Ref Range   T3, Free 2.9 2.3 - 4.2 pg/mL  Pro b natriuretic peptide     Status: None   Collection Time: 09/09/17  1:23 PM  Result Value Ref Range   NT-Pro BNP 78 0 - 287 pg/mL    Comment: The following cut-points have been suggested for the use of proBNP for the diagnostic evaluation of heart failure (HF) in patients with acute dyspnea: Modality                     Age           Optimal Cut                             (years)            Point ------------------------------------------------------ Diagnosis (rule in HF)        <50            450 pg/mL                           50 - 75            900 pg/mL                               >75           1800 pg/mL Exclusion (rule out HF)  Age independent  300 pg/mL   Myocardial Perfusion Imaging     Status: None   Collection Time: 09/15/17 12:33 PM  Result Value Ref Range   Rest HR 77 bpm   Rest BP 142/84 mmHg   Peak HR 96 bpm   Peak BP 153/106 mmHg   SSS 3    SRS 3    SDS 0    LHR 0.33    TID 0.93    LV sys vol 34 mL   LV dias vol 95 46 - 106 mL   Objective  Body mass index is 35.37 kg/m. Wt Readings from Last 3 Encounters:  09/18/17 219 lb 2 oz (99.4 kg)  09/15/17 217 lb (98.4 kg)  09/09/17 217 lb 6.4 oz (98.6 kg)   Temp Readings from Last 3 Encounters:  09/18/17 98.2 F (36.8 C) (Oral)  08/21/17 98.2 F (36.8 C) (Oral)  08/19/17 98.6 F (37 C) (Oral)   BP Readings from Last 3 Encounters:  09/18/17 118/80  09/09/17 108/64  08/21/17 118/74   Pulse Readings from Last 3 Encounters:  09/18/17 (!) 110  09/09/17 80  08/21/17 94   O2 sat room air 97%  Physical Exam  Constitutional: She is oriented to person, place, and time and well-developed, well-nourished, and in no distress. Vital signs are normal.  HENT:  Head: Normocephalic and atraumatic.  Mouth/Throat: Oropharynx is clear and moist and mucous membranes are normal.  Eyes: Conjunctivae are normal. Pupils are equal, round, and reactive to light.  Cardiovascular: Normal rate and regular rhythm.  Murmur heard. Pulmonary/Chest: Effort normal and breath sounds normal.  Abdominal: Soft. Bowel sounds are normal. There is no tenderness.  Neurological: She is alert and oriented to person, place, and time. Gait normal. Gait normal.  Skin: Skin is warm and dry.  Psychiatric: Mood, memory, affect and judgment normal.  Nursing note and vitals reviewed.   Assessment   1. Great  toe bruises vs other  2. Esophageal spasm/GERD, chronic nausea  3. HLD  4. DM 2 A1C 7.2 08/28/17  5.HM 6. 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 Pt would like referral to dermatology for reassurance  2. Trial of nifedipine 10 mg 30 minutes before meals up to bid until diltiazem off back order then resume diltiazem  carafate needs to start to see if helps sx's has not started yet  Refilled Zofran  3. Stop zetia trial of pravastatin 10 mg qhs  4. januvia 50 mg qd recheck A1C in 3 months from start of Januvia last A1C 7.9 05/29/17 And 7.2 08/28/17 Pt did not like Jardiance  5.  Declines flu shot  Tdap had <10 years ? Year need to upload [place] vx hx in future  pna 23 ? If had 2014 need to upload [place] vx history in future   Referred for 3d mammo.   Colonoscopy had 02/17/17 with serrated polyp Pap s/p hysterectomy if cervix still present paps still needed need to review in future and also indications for hysterectomy b/c if h/o abnormal pap and no cervix would still need paps   6. On Levothyroxine 25 mg qam need to repeat TSH at f/u if elevated increase dose   Provider: Dr. Olivia Mackie McLean-Scocuzza-Internal Medicine

## 2017-09-22 ENCOUNTER — Ambulatory Visit (INDEPENDENT_AMBULATORY_CARE_PROVIDER_SITE_OTHER): Payer: Medicare HMO | Admitting: Psychiatry

## 2017-09-22 ENCOUNTER — Encounter: Payer: Self-pay | Admitting: Psychiatry

## 2017-09-22 ENCOUNTER — Other Ambulatory Visit: Payer: Self-pay

## 2017-09-22 VITALS — BP 111/75 | HR 113 | Temp 98.7°F | Wt 218.8 lb

## 2017-09-22 DIAGNOSIS — R69 Illness, unspecified: Secondary | ICD-10-CM | POA: Diagnosis not present

## 2017-09-22 DIAGNOSIS — F250 Schizoaffective disorder, bipolar type: Secondary | ICD-10-CM | POA: Diagnosis not present

## 2017-09-22 DIAGNOSIS — F411 Generalized anxiety disorder: Secondary | ICD-10-CM | POA: Diagnosis not present
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
## 2017-09-23 ENCOUNTER — Encounter: Payer: Self-pay | Admitting: Psychiatry

## 2017-09-28 ENCOUNTER — Other Ambulatory Visit: Payer: Self-pay

## 2017-09-28 DIAGNOSIS — E063 Autoimmune thyroiditis: Secondary | ICD-10-CM

## 2017-09-28 NOTE — Telephone Encounter (Signed)
Last office visit 09/18/17 Dr Aundra Dubin Next office visit 12/21/17 Request 90 day

## 2017-09-29 NOTE — Telephone Encounter (Signed)
Please confirm with the patient that she has been taking this medication with no adverse effects.  Thanks.

## 2017-09-30 ENCOUNTER — Other Ambulatory Visit: Payer: Self-pay | Admitting: Internal Medicine

## 2017-09-30 DIAGNOSIS — E063 Autoimmune thyroiditis: Secondary | ICD-10-CM

## 2017-09-30 MED ORDER — LEVOTHYROXINE SODIUM 25 MCG PO TABS: 25.0000 ug | ORAL_TABLET | Freq: Every day | ORAL | 0 refills | Status: DC

## 2017-09-30 NOTE — Telephone Encounter (Signed)
Spoke with patient she is taking medication

## 2017-10-02 ENCOUNTER — Other Ambulatory Visit: Payer: Self-pay | Admitting: Family Medicine

## 2017-10-05 ENCOUNTER — Ambulatory Visit: Payer: Self-pay | Admitting: Cardiovascular Disease

## 2017-10-05 DIAGNOSIS — L608 Other nail disorders: Secondary | ICD-10-CM | POA: Diagnosis not present

## 2017-10-05 DIAGNOSIS — L720 Epidermal cyst: Secondary | ICD-10-CM | POA: Diagnosis not present

## 2017-10-06 ENCOUNTER — Encounter: Payer: Self-pay | Admitting: Psychiatry

## 2017-10-06 ENCOUNTER — Other Ambulatory Visit: Payer: Self-pay

## 2017-10-06 ENCOUNTER — Ambulatory Visit: Payer: Medicare HMO | Admitting: Psychiatry

## 2017-10-06 VITALS — BP 114/79 | HR 116 | Temp 98.6°F | Wt 217.8 lb

## 2017-10-06 DIAGNOSIS — F250 Schizoaffective disorder, bipolar type: Secondary | ICD-10-CM

## 2017-10-06 DIAGNOSIS — F99 Mental disorder, not otherwise specified: Secondary | ICD-10-CM

## 2017-10-06 DIAGNOSIS — F411 Generalized anxiety disorder: Secondary | ICD-10-CM

## 2017-10-06 DIAGNOSIS — R69 Illness, unspecified: Secondary | ICD-10-CM | POA: Diagnosis not present

## 2017-10-06 DIAGNOSIS — F5105 Insomnia due to other mental disorder: Secondary | ICD-10-CM | POA: Diagnosis not present

## 2017-10-06 MED ORDER — RAMELTEON 8 MG PO TABS: 8.0000 mg | ORAL_TABLET | Freq: Every day | ORAL | 1 refills | Status: DC
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
## 2017-10-06 NOTE — Patient Instructions (Signed)
Ramelteon tablets  What is this medicine?  RAMELTEON (ram EL tee on) is used to treat insomnia. This medicine helps you to fall asleep.  This medicine may be used for other purposes; ask your health care provider or pharmacist if you have questions.  COMMON BRAND NAME(S): Rozerem  What should I tell my health care provider before I take this medicine?  They need to know if you have any of these conditions:  -depression  -history of a drug or alcohol abuse problem  -liver disease  -lung or breathing disease  -suicidal thoughts  -an unusual or allergic reaction to ramelteon, other medicines, foods, dyes, or preservatives  -pregnant or trying to get pregnant  -breast-feeding  How should I use this medicine?  Take this medicine by mouth with a glass of water. Do not break tablets; swallow whole. Follow the directions on the prescription label. It is better to take this medicine on an empty stomach and only when you are ready for bed. Do not take your medicine more often than directed.  A special MedGuide will be given to you by the pharmacist with each prescription and refill. Be sure to read this information carefully each time.  Talk to your pediatrician regarding the use of this medicine in children. Special care may be needed.  Overdosage: If you think you have taken too much of this medicine contact a poison control center or emergency room at once.  NOTE: This medicine is only for you. Do not share this medicine with others.  What if I miss a dose?  This does not apply. This medicine should only be taken immediately before going to sleep. Do not take double or extra doses.  What may interact with this medicine?  Do not take this medicine with any of the following medications:  -fluvoxamine  -melatonin  This medicine may also interact with the following medications:  -medicines used to treat fungal infections like ketoconazole, fluconazole, or itraconazole  -rifampin  This list may not describe all possible  interactions. Give your health care provider a list of all the medicines, herbs, non-prescription drugs, or dietary supplements you use. Also tell them if you smoke, drink alcohol, or use illegal drugs. Some items may interact with your medicine.  What should I watch for while using this medicine?  Visit your doctor or health care professional for regular checks on your progress. Keep a regular sleep schedule by going to bed at about the same time each night. Avoid caffeine-containing drinks in the evening hours. Talk to your doctor if you still have trouble sleeping within 7 to 10 days of using this medicine. This may mean there is another cause for your sleep problems.  After taking this medicine for sleep, you may get up out of bed while not being fully awake and do an activity that you do not know you are doing. The next morning, you may have no memory of the event. Activities such as driving a car ("sleep-driving"), making and eating food, talking on the phone, sexual activity, and sleep-walking have been reported. Call your doctor right away if you find out you have done any of these activities. Do not take this medicine if you drink alcohol or have taken another medicine for sleep, since your risk of doing these sleep-related activities will be increased.  Do not take this medicine unless you are able to stay in bed for a full night (7 to 8 hours) before you must be active again. You   may have a decrease in mental alertness the day after use, even if you feel that you are fully awake. Tell your doctor if you will need to perform activities requiring full alertness, such as driving, the next day. Do not stand or sit up quickly after taking this medicine, especially if you are an older patient. This reduces the risk of dizzy or fainting spells.  If you or your family notice any changes in your behavior, such as new or worsening depression, thoughts of harming yourself, anxiety, other unusual or disturbing  thoughts, or memory loss, call your doctor right away.  What side effects may I notice from receiving this medicine?  Side effects that you should report to your doctor or health care professional as soon as possible:  -allergic reactions like skin rash, itching or hives, swelling of the face, lips, or tongue  -breast milk production or discharge  -breathing problems  -joint or muscle pain  -depression, suicidal thoughts  -missed monthly period (for women)  -unusual activities while asleep like driving, eating, making phone calls  -unusually weak or tired  -worsening of insomnia  Side effects that usually do not require medical attention (report to your doctor or health care professional if they continue or are bothersome):  -bad taste  -daytime sleepiness  -decreased sex drive  -diarrhea  -headache  -nausea  This list may not describe all possible side effects. Call your doctor for medical advice about side effects. You may report side effects to FDA at 1-800-FDA-1088.  Where should I keep my medicine?  Keep out of the reach of children.  Store at room temperature between 15 and 30 degrees C (59 and 86 degrees F). Keep the container tightly closed. Protect from moisture and humidity. Throw away any unused medicine after the expiration date.  NOTE: This sheet is a summary. It may not cover all possible information. If you have questions about this medicine, talk to your doctor, pharmacist, or health care provider.   2018 Elsevier/Gold Standard (2014-04-18 18:21:29)

## 2017-10-07 ENCOUNTER — Other Ambulatory Visit: Payer: Self-pay | Admitting: Family Medicine

## 2017-10-09 NOTE — Telephone Encounter (Signed)
Last OV 05/29/17 last filled 08/31/17 90 0rf

## 2017-10-21 ENCOUNTER — Telehealth: Payer: Self-pay | Admitting: Family Medicine

## 2017-10-21 DIAGNOSIS — K224 Dyskinesia of esophagus: Secondary | ICD-10-CM

## 2017-10-21 NOTE — Telephone Encounter (Unsigned)
Copied from [place] [phone]. Topic: Quick Communication - Rx Refill/Question >> Oct 21, 2017  3:24 PM Carolyn Stare wrote: Medication     NIFEdipine (PROCARDIA) 10 MG capsule     Has the patient contacted their pharmacy yes  no refills pharmacy told her to call her doctors office   Preferred Pharmacy   [facility]   Agent: Please be advised that RX refills may take up to 3 business days. We ask that you follow-up with your pharmacy.

## 2017-10-21 NOTE — Telephone Encounter (Signed)
Procardia refill. Per notes on prescription:Take 1 capsule (10 mg total) by mouth 2 (two) times daily. Take 30 minutes before meals up to 1x per day, In place of Dilitazem until off back order then take Diltiazem and stop this  Last Refill: Last prescribed by Dr. Terese Door  For 60 caps on 09/18/17 Pharmacy: [facility] [address]

## 2017-10-22 MED ORDER — NIFEDIPINE 10 MG PO CAPS: 10.0000 mg | ORAL_CAPSULE | Freq: Two times a day (BID) | ORAL | 0 refills | Status: DC

## 2017-10-22 NOTE — Telephone Encounter (Signed)
Please advise 

## 2017-10-22 NOTE — Telephone Encounter (Signed)
Nifedipine refilled.  She should continue this as previously advised and then discontinue when diltiazem is off of back order.  Thanks.

## 2017-10-23 ENCOUNTER — Ambulatory Visit
Admission: RE | Admit: 2017-10-23 | Discharge: 2017-10-23 | Disposition: A | Discharge status: Home / Self Care | Payer: Medicare HMO | Source: Ambulatory Visit | Attending: Family Medicine | Admitting: Family Medicine

## 2017-10-23 ENCOUNTER — Ambulatory Visit (INDEPENDENT_AMBULATORY_CARE_PROVIDER_SITE_OTHER): Payer: Medicare HMO | Admitting: Family Medicine

## 2017-10-23 ENCOUNTER — Encounter: Payer: Self-pay | Admitting: Family Medicine

## 2017-10-23 ENCOUNTER — Other Ambulatory Visit: Payer: Self-pay

## 2017-10-23 VITALS — BP 110/72 | HR 80 | Temp 97.9°F | Wt 219.6 lb

## 2017-10-23 DIAGNOSIS — M5441 Lumbago with sciatica, right side: Secondary | ICD-10-CM | POA: Diagnosis not present

## 2017-10-23 DIAGNOSIS — M47816 Spondylosis without myelopathy or radiculopathy, lumbar region: Secondary | ICD-10-CM | POA: Insufficient documentation

## 2017-10-23 DIAGNOSIS — L0292 Furuncle, unspecified: Secondary | ICD-10-CM | POA: Diagnosis not present

## 2017-10-23 DIAGNOSIS — M545 Low back pain: Secondary | ICD-10-CM | POA: Diagnosis not present

## 2017-10-23 DIAGNOSIS — R748 Abnormal levels of other serum enzymes: Secondary | ICD-10-CM | POA: Diagnosis not present

## 2017-10-23 DIAGNOSIS — R32 Unspecified urinary incontinence: Secondary | ICD-10-CM

## 2017-10-23 HISTORY — DX: Lumbago with sciatica, right side: M54.41

## 2017-10-23 LAB — HEPATIC FUNCTION PANEL
ALT: 21 U/L (ref 0–35)
AST: 25 U/L (ref 0–37)
Albumin: 4 g/dL (ref 3.5–5.2)
Alkaline Phosphatase: 99 U/L (ref 39–117)
Bilirubin, Direct: 0.1 mg/dL (ref 0.0–0.3)
Total Bilirubin: 0.4 mg/dL (ref 0.2–1.2)
Total Protein: 7.5 g/dL (ref 6.0–8.3)

## 2017-10-23 LAB — POCT URINALYSIS DIPSTICK
Bilirubin, UA: POSITIVE
Blood, UA: NEGATIVE
Glucose, UA: NEGATIVE
Ketones, UA: NEGATIVE
Leukocytes, UA: NEGATIVE
Nitrite, UA: NEGATIVE
Protein, UA: NEGATIVE
Spec Grav, UA: >=1.03 — AB (ref 1.010–1.025)
Urobilinogen, UA: 1 E.U./dL
pH, UA: 5 (ref 5.0–8.0)

## 2017-10-23 LAB — GAMMA GT: GGT: 61 U/L — ABNORMAL HIGH (ref 7–51)

## 2017-10-23 NOTE — Telephone Encounter (Signed)
Will inform at patients appointment today

## 2017-10-23 NOTE — Assessment & Plan Note (Addendum)
Acute worsening of her chronic low back pain with new onset of urinary incontinence.  She has a relatively benign exam today though the concern given the urinary incontinence and worsening of her back pain will be for cauda equina syndrome.  Given this we will obtain MRI lumbar spine.  We did check her urine and there are no signs of infection.  Next step in management will depend on the results of the MRI.  She is given return precautions.

## 2017-10-23 NOTE — Progress Notes (Signed)
[name], MD Phone: [phone]  Valerie Vazquez is a 62 y.o. female who presents today for same day visit.   Patient has chronic low back pain though over the last couple weeks it started to hurt increasingly more.  Particularly hurts with standing.  Resting and sitting does help some.  She notes on 2-3 occasions she has woken up in the morning and has urinated on herself.  She has a distant history of this though this has not occurred recently.  No bowel incontinence.  No saddle anesthesia.  No numbness or weakness.  Does feel like she is walking on egg shells and that is chronic.  Notes the back pain is midline.  Radiates down to her right buttocks.  She is taking her muscle relaxer with some benefit.  Notes that the shooting pain.  No fevers.  Some urinary urgency though no frequency or dysuria.  She reports having had a boil previously that popped on its own and is not bothering her now.  She saw dermatology for this.  Social History   Tobacco Use  Smoking Status Former Smoker  . Packs/day: 1.50  . Years: 10.00  . Pack years: 15.00  . Types: Cigarettes  . Last attempt to quit: 04/12/1979  . Years since quitting: 38.5  Smokeless Tobacco Never Used     ROS see history of present illness  Objective  Physical Exam Vitals:   10/23/17 0953  BP: 110/72  Pulse: 80  Temp: 97.9 F (36.6 C)  SpO2: 96%    BP Readings from Last 3 Encounters:  10/23/17 110/72  10/06/17 114/79  09/22/17 111/75   Wt Readings from Last 3 Encounters:  10/23/17 219 lb 9.6 oz (99.6 kg)  10/06/17 217 lb 12.8 oz (98.8 kg)  09/22/17 218 lb 12.8 oz (99.2 kg)    Physical Exam  Constitutional: No distress.  Cardiovascular: Normal rate, regular rhythm and normal heart sounds.  Pulmonary/Chest: Effort normal and breath sounds normal.  Musculoskeletal: She exhibits no edema.  No midline spine tenderness, no midline spine step-off, no muscular back tenderness  Neurological: She is alert. Gait  normal.  5/5 strength bilateral quads, hamstrings, plantar flexion, and dorsiflexion, sensation light touch intact bilateral lower extremities, intact perineal sensation, slight decreased rectal tone  Skin: Skin is warm and dry. She is not diaphoretic.     Assessment/Plan: Please see individual problem list.  Acute right-sided low back pain with right-sided sciatica Acute worsening of her chronic low back pain with new onset of urinary incontinence.  She has a relatively benign exam today though the concern given the urinary incontinence and worsening of her back pain will be for cauda equina syndrome.  Given this we will obtain MRI lumbar spine.  We did check her urine and there are no signs of infection.  Next step in management will depend on the results of the MRI.  She is given return precautions.  Boil Resolved. Evaluated by Payton Mccallum. She will monitor for recurrence.   Urinary incontinence Possibly related to her back though could be related to OAB. Urinalysis negative for infection. Could trial medication for OAB or refer to urology if needed.   Elevated alkaline phosphatase level Recheck with GGT.    Orders Placed This Encounter  Procedures  . MR Lumbar Spine Wo Contrast    Standing Status:   Future    Number of Occurrences:   1    Standing Expiration Date:   12/22/2018    Order Specific Question:  What is the patient's sedation requirement?    Answer:   No Sedation    Order Specific Question:   Does the patient have a pacemaker or implanted devices?    Answer:   No    Comments:   does have a IVC filter in place, has had prior MRI     Order Specific Question:   Preferred imaging location?    Answer:   [facility] (table limit-300lbs)    Order Specific Question:   Radiology Contrast Protocol - do NOT remove file path    Answer:   \\[email or web address]  . Hepatic function panel  . Gamma GT  . POCT Urinalysis Dipstick    No orders of the defined  types were placed in this encounter.    [name], MD [place]

## 2017-10-24 ENCOUNTER — Encounter: Payer: Self-pay | Admitting: Family Medicine

## 2017-10-25 ENCOUNTER — Other Ambulatory Visit: Payer: Self-pay | Admitting: Family Medicine

## 2017-10-25 DIAGNOSIS — M48061 Spinal stenosis, lumbar region without neurogenic claudication: Secondary | ICD-10-CM

## 2017-10-26 ENCOUNTER — Telehealth: Payer: Self-pay | Admitting: Family Medicine

## 2017-10-26 NOTE — Telephone Encounter (Signed)
Called and spoke with patient advised PCP out of Office this week but would refer request for Back Brace , but that PCP may want her to consult with Neurosurgery before prescribing a brace.

## 2017-10-26 NOTE — Telephone Encounter (Signed)
Juliann Pulse,  I do not regularly prescribe a back brace. I am not sure of the therapeutic advantages after a pulled muscle.  Typically recommend moist heat, ibuprofen If she is hasnt pain- would you advise either an appt here or go to walk [facility] at [facility]

## 2017-10-26 NOTE — Telephone Encounter (Signed)
Called and explained to patient the meaning of spinal stenosis and non nerve impingement. Patient voiced understanding and wishes to continue with referral neurosurgery.

## 2017-10-26 NOTE — Telephone Encounter (Signed)
Patient states she fell Friday night and feels like she pulled a muscle. Patient states she did not get evaluated she just feels sore. Patient aware Dr.Sonnneberg is out until Monday.

## 2017-10-26 NOTE — Telephone Encounter (Signed)
Copied from [place] [phone]. Topic: Quick Communication - See Telephone Encounter >> Oct 26, 2017  3:41 PM Lolita Rieger, [place] wrote: CRM for notification. See Telephone encounter for:   10/26/17.pt called requesting an rx for a back brace and she would like for someone to call her [phone]  when this can be picked up

## 2017-10-26 NOTE — Telephone Encounter (Signed)
Copied from [place] [phone]. Topic: Quick Communication - See Telephone Encounter >> Oct 26, 2017  3:41 PM Lolita Rieger, [place] wrote: CRM for notification. See Telephone encounter for:   10/26/17.pt called requesting an rx for a back brace and she would like for someone to call her [phone]  when this can be picked up

## 2017-10-27 ENCOUNTER — Other Ambulatory Visit: Payer: Self-pay | Admitting: Family Medicine

## 2017-10-27 DIAGNOSIS — R32 Unspecified urinary incontinence: Secondary | ICD-10-CM | POA: Insufficient documentation

## 2017-10-27 DIAGNOSIS — R748 Abnormal levels of other serum enzymes: Secondary | ICD-10-CM | POA: Insufficient documentation

## 2017-10-27 DIAGNOSIS — L0292 Furuncle, unspecified: Secondary | ICD-10-CM | POA: Insufficient documentation

## 2017-10-27 DIAGNOSIS — N3946 Mixed incontinence: Secondary | ICD-10-CM | POA: Insufficient documentation

## 2017-10-27 NOTE — Assessment & Plan Note (Signed)
Recheck with GGT.

## 2017-10-27 NOTE — Progress Notes (Signed)
g

## 2017-10-27 NOTE — Assessment & Plan Note (Signed)
Resolved. Evaluated by Payton Mccallum. She will monitor for recurrence.

## 2017-10-27 NOTE — Telephone Encounter (Signed)
Patient advised will wait for Neurosurgery evaluation.

## 2017-10-27 NOTE — Assessment & Plan Note (Signed)
Possibly related to her back though could be related to OAB. Urinalysis negative for infection. Could trial medication for OAB or refer to urology if needed.

## 2017-10-27 NOTE — Telephone Encounter (Signed)
I agree. I do not think there is a role for a back brace. Neurosurgery referral previously placed.

## 2017-11-02 ENCOUNTER — Ambulatory Visit: Payer: Self-pay | Admitting: *Deleted

## 2017-11-02 ENCOUNTER — Telehealth: Payer: Self-pay | Admitting: Family Medicine

## 2017-11-02 NOTE — Telephone Encounter (Signed)
Please advise 

## 2017-11-02 NOTE — Telephone Encounter (Signed)
Pt reports chronic back pain. Seen 10/23/17 by Dr. Caryl Bis, MRI done. Pt reports fell 10/24/17, denies any acute injury. States back pain 5/10,  has not increased but both legs "weak and wobbly."  "I have to use my Rolator more often." Denies any bowel or bladder issues. States pain does not radiate. States she has to stop and rest often with ADLs. Has referral for neurology in August. "States can't wait that long." Appt made for Thursday with Dr. Terese Door (pt requested PCP, has seen her before.)  Gave ED as option given weakness, declines at present, states will go if worsens.  Reason for Disposition . Weakness is a chronic symptom (recurrent or ongoing AND present > 4 weeks)  Answer Assessment - Initial Assessment Questions 1. DESCRIPTION: "Describe how you are feeling."    Both legs weak, "wobbly." 2. SEVERITY: "How bad is it?"  "Can you stand and walk?"   - MILD - Feels weak or tired, but does not interfere with work, school or normal activities   - [place] to stand and walk; weakness interferes with work, school, or normal activities   - SEVERE - Unable to stand or walk     Have to use my rollator; 5/10 weakness. Dull pain lower back 3. ONSET:  "When did the weakness begin?"     Saw Dr. Caryl Bis 10/23/17 for symptoms lower back pain, weakness, MRI done,  fell 10/24/17, weakness worsened 2 days ago. 4. CAUSE: "What do you think is causing the weakness?"     My back 5. MEDICINES: "Have you recently started a new medicine or had a change in the amount of a medicine?"     No 6. OTHER SYMPTOMS: "Do you have any other symptoms?" (e.g., chest pain, fever, cough, SOB, vomiting, diarrhea, bleeding)     No  Protocols used: WEAKNESS (GENERALIZED) AND FATIGUE-A-AH

## 2017-11-02 NOTE — Telephone Encounter (Signed)
Copied from [place]. Topic: Referral - Question >> Nov 02, 2017  1:32 PM Margot Ables wrote: Reason for CRM: pt states she called Dr. Serita Grit office [facility] and cannot get in until August. Pt states she cannot wait that long for an appt. Pt states that she is having trouble walking and legs don't support her. She said starting this Saturday she felt wobbley and it scared her. Pt was transferred to triage nurse. Pt states she has seen Dr. Joya Salm in the past at [facility] but isn't sure if he treats this type of issue.

## 2017-11-04 NOTE — Telephone Encounter (Signed)
Noted. It appears the triage nurse scheduled her with Dr McLean-Scocuzza tomorrow. I would wait to see what that evaluation reveals. There was no change on her recent MRI to indicate why she would have a change in symptoms. If she worsens she needs to get evaluated sooner. She may need neurosurgery referral or to see [facility] for neurology evaluation. Thanks.

## 2017-11-04 NOTE — Telephone Encounter (Signed)
Patient notified

## 2017-11-05 ENCOUNTER — Encounter: Payer: Self-pay | Admitting: Internal Medicine

## 2017-11-05 ENCOUNTER — Ambulatory Visit (INDEPENDENT_AMBULATORY_CARE_PROVIDER_SITE_OTHER): Payer: Medicare HMO | Admitting: Internal Medicine

## 2017-11-05 ENCOUNTER — Other Ambulatory Visit: Payer: Self-pay | Admitting: Internal Medicine

## 2017-11-05 VITALS — BP 108/60 | HR 89 | Temp 98.0°F | Ht 66.0 in | Wt 216.4 lb

## 2017-11-05 DIAGNOSIS — I10 Essential (primary) hypertension: Secondary | ICD-10-CM | POA: Diagnosis not present

## 2017-11-05 DIAGNOSIS — R69 Illness, unspecified: Secondary | ICD-10-CM | POA: Diagnosis not present

## 2017-11-05 DIAGNOSIS — E1142 Type 2 diabetes mellitus with diabetic polyneuropathy: Secondary | ICD-10-CM | POA: Diagnosis not present

## 2017-11-05 DIAGNOSIS — E669 Obesity, unspecified: Secondary | ICD-10-CM | POA: Diagnosis not present

## 2017-11-05 DIAGNOSIS — M48061 Spinal stenosis, lumbar region without neurogenic claudication: Secondary | ICD-10-CM

## 2017-11-05 DIAGNOSIS — G8929 Other chronic pain: Secondary | ICD-10-CM

## 2017-11-05 DIAGNOSIS — I2510 Atherosclerotic heart disease of native coronary artery without angina pectoris: Secondary | ICD-10-CM | POA: Diagnosis not present

## 2017-11-05 DIAGNOSIS — E039 Hypothyroidism, unspecified: Secondary | ICD-10-CM

## 2017-11-05 DIAGNOSIS — M5440 Lumbago with sciatica, unspecified side: Secondary | ICD-10-CM | POA: Diagnosis not present

## 2017-11-05 DIAGNOSIS — M5416 Radiculopathy, lumbar region: Secondary | ICD-10-CM

## 2017-11-05 DIAGNOSIS — E785 Hyperlipidemia, unspecified: Secondary | ICD-10-CM | POA: Diagnosis not present

## 2017-11-05 DIAGNOSIS — F329 Major depressive disorder, single episode, unspecified: Secondary | ICD-10-CM | POA: Diagnosis not present

## 2017-11-05 DIAGNOSIS — G4700 Insomnia, unspecified: Secondary | ICD-10-CM | POA: Diagnosis not present

## 2017-11-05 MED ORDER — PREDNISONE 20 MG PO TABS: 40.0000 mg | ORAL_TABLET | Freq: Every day | ORAL | 0 refills | Status: DC

## 2017-11-05 NOTE — Patient Instructions (Signed)
I referred you to [facility] Dr. Lynann Bologna for abnormal low back MRI and back pain and leg weakness  Try Prednisone and Tylenol until then  F/u in 4-6 weeks sooner if needed   Back Pain, Adult Many adults have back pain from time to time. Common causes of back pain include:  A strained muscle or ligament.  Wear and tear (degeneration) of the spinal disks.  Arthritis.  A hit to the back.  Back pain can be short-lived (acute) or last a long time (chronic). A physical exam, lab tests, and imaging studies may be done to find the cause of your pain. Follow these instructions at home: Managing pain and stiffness  Take over-the-counter and prescription medicines only as told by your health care provider.  If directed, apply heat to the affected area as often as told by your health care provider. Use the heat source that your health care provider recommends, such as a moist heat pack or a heating pad. ? Place a towel between your skin and the heat source. ? Leave the heat on for 20-30 minutes. ? Remove the heat if your skin turns bright red. This is especially important if you are unable to feel pain, heat, or cold. You have a greater risk of getting burned.  If directed, apply ice to the injured area: ? Put ice in a plastic bag. ? Place a towel between your skin and the bag. ? Leave the ice on for 20 minutes, 2-3 times a day for the first 2-3 days. Activity  Do not stay in bed. Resting more than 1-2 days can delay your recovery.  Take short walks on even surfaces as soon as you are able. Try to increase the length of time you walk each day.  Do not sit, drive, or stand in one place for more than 30 minutes at a time. Sitting or standing for long periods of time can put stress on your back.  Use proper lifting techniques. When you bend and lift, use positions that put less stress on your back: ? Fairdealing your knees. ? Keep the load close to your body. ? Avoid  twisting.  Exercise regularly as told by your health care provider. Exercising will help your back heal faster. This also helps prevent back injuries by keeping muscles strong and flexible.  Your health care provider may recommend that you see a physical therapist. This person can help you come up with a safe exercise program. Do any exercises as told by your physical therapist. Lifestyle  Maintain a healthy weight. Extra weight puts stress on your back and makes it difficult to have good posture.  Avoid activities or situations that make you feel anxious or stressed. Learn ways to manage anxiety and stress. One way to manage stress is through exercise. Stress and anxiety increase muscle tension and can make back pain worse. General instructions  Sleep on a firm mattress in a comfortable position. Try lying on your side with your knees slightly bent. If you lie on your back, put a pillow under your knees.  Follow your treatment plan as told by your health care provider. This may include: ? Cognitive or behavioral therapy. ? Acupuncture or massage therapy. ? Meditation or yoga. Contact a health care provider if:  You have pain that is not relieved with rest or medicine.  You have increasing pain going down into your legs or buttocks.  Your pain does not improve in 2 weeks.  You have pain at  night.  You lose weight.  You have a fever or chills. Get help right away if:  You develop new bowel or bladder control problems.  You have unusual weakness or numbness in your arms or legs.  You develop nausea or vomiting.  You develop abdominal pain.  You feel faint. Summary  Many adults have back pain from time to time. A physical exam, lab tests, and imaging studies may be done to find the cause of your pain.  Use proper lifting techniques. When you bend and lift, use positions that put less stress on your back.  Take over-the-counter and prescription medicines and apply heat or  ice as directed by your health care provider. This information is not intended to replace advice given to you by your health care provider. Make sure you discuss any questions you have with your health care provider. Document Released: 08/18/2005 Document Revised: 09/22/2016 Document Reviewed: 09/22/2016 Elsevier Interactive Patient Education  [employer].

## 2017-11-05 NOTE — Progress Notes (Signed)
Pre visit review using our clinic review tool, if applicable. No additional management support is needed unless otherwise documented below in the visit note. 

## 2017-11-06 LAB — TSH: TSH: 1.95 u[IU]/mL (ref 0.35–4.50)

## 2017-11-08 ENCOUNTER — Encounter: Payer: Self-pay | Admitting: Internal Medicine

## 2017-11-08 NOTE — Progress Notes (Signed)
Chief Complaint  Patient presents with  . Follow-up   Pt c/o worsening back pain and b/l leg weakness using a walker today. She has tried Tylenol 2x per day arthritis formulation.  She feels like there is a knot in her low back.  She reports she was eval by PCP for low back pain 2/22 and fell 10/24/17. Since fall she is having low back pain and legs feel weak. No bowel/bladder incontinence or saddle anesthesia. She is also having numbness and burning in left foot sole of foot. H/o ACDF cervical region in [place] in 2014. Denies numbness/tingling in arms.   MRI 10/23/17  Mild convex left scoliosis noted.  No listhesis.  Vertebrae:  No fracture or worrisome lesion.  Conus medullaris and cauda equina: Conus extends to the L1-2 level. Conus and cauda equina appear normal.  Paraspinal and other soft tissues: Negative.  Disc levels:  T11-12 is imaged in the sagittal plane only and negative.  T12-L1: Negative.  L1-2: Moderate facet arthropathy.  Otherwise negative.  L2-3: There is some ligamentum flavum thickening and moderate to advanced facet degenerative disease, worse on the right. Mild central canal narrowing is unchanged. The foramina are widely patent.  L3-4: Ligamentum flavum thickening and moderate to advanced facet degenerative disease appear unchanged. No disc bulge or protrusion. Mild central canal narrowing is again seen. The foramina are open.  L4-5: Advanced facet arthropathy is slightly worse on the left. There is some ligamentum flavum thickening and a shallow disc bulge. Moderate central canal stenosis and narrowing in the right subarticular recess which could impact the right L5 root appear unchanged. The left foramen is open. Mild to moderate right foraminal stenosis is unchanged.  L5-S1: Advanced bilateral facet degenerative disease. No disc bulge. The central canal and foramina are widely patent.  IMPRESSION: No new abnormality since the  comparison MRI.  Spondylosis most notable at L4-5 where there is moderate central canal stenosis and narrowing in the right subarticular recess which could impact the right L5 root. Moderate right foraminal narrowing is also present at this level.     Review of Systems  Constitutional: Negative for weight loss.  HENT: Negative for hearing loss.   Eyes: Negative for blurred vision.  Respiratory: Negative for shortness of breath.   Cardiovascular: Negative for chest pain.  Gastrointestinal:       Denies bowel incontinence   Genitourinary:       +denies urinary incontinence   Musculoskeletal: Positive for back pain.  Skin: Negative for rash.  Neurological: Positive for sensory change and focal weakness.  Psychiatric/Behavioral: Negative for memory loss.   Past Medical History:  Diagnosis Date  . Anemia   . Bilateral renal cysts   . Chronic back pain    "upper and lower" (09/19/2014)  . Depression    "major" (09/19/2014)  . DVT (deep venous thrombosis) ([place])   . Esophageal spasm   . Gallstones   . Gastritis   . Gastroenteritis   . GERD (gastroesophageal reflux disease)   . Headache    "couple /month lately" (09/19/2014)  . High blood pressure   . High cholesterol   . History of blood transfusion 1985   related to hysterectomy  . History of nuclear stress test    Myoview 1/19:  EF 65, no ischemia or scar  . Hypertension   . IBS (irritable bowel syndrome)   . Migraine    "a few times/yr" (09/19/2014)  . Myocardial infarction ([place]) 10/2010 X 3   "while hospitalized"  .  Osteoarthritis    "qwhere; mostly around my joints" (09/19/2014)  . Pneumonia 04/2014  . PTSD (post-traumatic stress disorder)   . Pulmonary embolism ([place]) 04/2001; 09/19/2014   "after gallbladder OR; "  . Schizoaffective disorder ([place])   . Sleep apnea    "mild" (09/19/2014)  . Snoring    a. sleep study 5/16: No OSA  . Spinal stenosis   . Spondylosis   . Type II diabetes mellitus ([place])    "dx'd in  2007; lost weight; no RX for ~ 4 yr now" (09/19/2014)   Past Surgical History:  Procedure Laterality Date  . ABDOMINAL HYSTERECTOMY  1985  . ANTERIOR CERVICAL DECOMP/DISCECTOMY FUSION  10/2012   in [place] C5-C7   . APPENDECTOMY  1985  . BACK SURGERY    . BREAST CYST EXCISION Right   . BREAST EXCISIONAL BIOPSY Right YRS AGO   NEG  . CARDIAC CATHETERIZATION   2000's; 2009  . CHOLECYSTECTOMY  2002  . COLONOSCOPY WITH PROPOFOL N/A 12/12/2016   Procedure: COLONOSCOPY WITH PROPOFOL;  Surgeon: [name], MD;  Location: [facility] ENDOSCOPY;  Service: Endoscopy;  Laterality: N/A;  . COLONOSCOPY WITH PROPOFOL N/A 02/17/2017   Procedure: COLONOSCOPY WITH PROPOFOL;  Surgeon: [name], MD;  Location: [facility] ENDOSCOPY;  Service: Gastroenterology;  Laterality: N/A;  . ESOPHAGOGASTRODUODENOSCOPY (EGD) WITH PROPOFOL N/A 02/17/2017   Procedure: ESOPHAGOGASTRODUODENOSCOPY (EGD) WITH PROPOFOL;  Surgeon: [name], MD;  Location: [facility] ENDOSCOPY;  Service: Gastroenterology;  Laterality: N/A;  . EXCISION/RELEASE BURSA HIP Right 12/1984  . HERNIA REPAIR  1985  . LEFT HEART CATHETERIZATION WITH CORONARY ANGIOGRAM N/A 12/09/2014   Procedure: LEFT HEART CATHETERIZATION WITH CORONARY ANGIOGRAM;  Surgeon: Burnell Blanks, MD;  Location: [facility] CATH LAB;  Service: Cardiovascular;  Laterality: N/A;  . TONSILLECTOMY AND ADENOIDECTOMY  ~ 1968  . TOTAL HIP ARTHROPLASTY Left 04-06-2014  . UMBILICAL HERNIA REPAIR  1985  . VENA CAVA FILTER PLACEMENT  03/2014   Family History  Problem Relation Age of Onset  . Stroke Mother        Deceased  . Lung cancer Father        Deceased  . Healthy Daughter   . Healthy Son        x 2  . Other Son        Suicide  . Heart attack Neg Hx    Social History   Socioeconomic History  . Marital status: Divorced    Spouse name: Not on file  . Number of children: Not on file  . Years of education: Not on file  . Highest education level: Not on file  Social Needs  . Financial  resource strain: Not on file  . Food insecurity - worry: Not on file  . Food insecurity - inability: Not on file  . Transportation needs - medical: Not on file  . Transportation needs - non-medical: Not on file  Occupational History  . Not on file  Tobacco Use  . Smoking status: Former Smoker    Packs/day: 1.50    Years: 10.00    Pack years: 15.00    Types: Cigarettes    Last attempt to quit: 04/12/1979    Years since quitting: 38.6  . Smokeless tobacco: Never Used  Substance and Sexual Activity  . Alcohol use: No    Alcohol/week: 0.0 oz  . Drug use: No  . Sexual activity: Not Currently  Other Topics Concern  . Not on file  Social History Narrative   Lives with fiancee in  an apartment on the first floor.  Has 3 grown children.  One child passed away.  On disability 12/28/94 - 2000, 2007 - current.     Education: some college.   Current Meds  Medication Sig  . carvedilol (COREG) 3.125 MG tablet Take 1 tablet (3.125 mg total) by mouth 2 (two) times daily with a meal.  . cyclobenzaprine (FLEXERIL) 10 MG tablet TAKE 1 TABLET BY MOUTH THREE TIMES A DAY AS NEEDED FOR MUSCLE SPASMS  . dicyclomine (BENTYL) 20 MG tablet TAKE 1 TABLET BY MOUTH 3 TIMES A DAY AS NEEDED FOR SPASMS  . diltiazem (CARDIZEM) 60 MG tablet Take 60 mg by mouth 2 (two) times daily.  . diphenhydrAMINE (BENADRYL) 25 MG tablet Take 1 tablet (25 mg total) by mouth every 6 (six) hours.  Marland Kitchen ELIQUIS 5 MG TABS tablet TAKE 1 TABLET (5 MG TOTAL) BY MOUTH 2 (TWO) TIMES DAILY.  Marland Kitchen escitalopram (LEXAPRO) 20 MG tablet Take 1 tablet (20 mg total) by mouth daily.  . furosemide (LASIX) 20 MG tablet Take 20 mg by mouth daily as needed.  . gabapentin (NEURONTIN) 600 MG tablet Take 1 tablet (600 mg total) by mouth 2 (two) times daily.  . isosorbide mononitrate (IMDUR) 30 MG 24 hr tablet Take 1.5 tablets (45 mg total) by mouth daily.  Marland Kitchen levothyroxine (SYNTHROID, LEVOTHROID) 25 MCG tablet Take 1 tablet (25 mcg total) by mouth daily before  breakfast. Wait 2-3 hours before taking any other medication or supplement  . Multiple Vitamins-Minerals (SUPER THERA VITE M) TABS Take by mouth.  . neomycin-polymyxin b-dexamethasone (MAXITROL) 3.5-10000-0.1 OINT APPLY A SMALL AMOUNT IN RIGHT EYE 2 TIMES A DAY FOR 14 DAYS  . NIFEdipine (PROCARDIA) 10 MG capsule Take 1 capsule (10 mg total) by mouth 2 (two) times daily. Take 30 minutes before meals up to 1x per day, In place of Dilitazem until off back order then take Diltiazem and stop this  . OLANZapine (ZYPREXA) 10 MG tablet Take 1 tablet (10 mg total) by mouth at bedtime.  . ondansetron (ZOFRAN) 4 MG tablet Take 1 tablet (4 mg total) by mouth 2 (two) times daily as needed for nausea or vomiting.  . pantoprazole (PROTONIX) 40 MG tablet TAKE 1 TABLET BY MOUTH EVERY DAY  . pravastatin (PRAVACHOL) 10 MG tablet Take 1 tablet (10 mg total) by mouth at bedtime.  . ramelteon (ROZEREM) 8 MG tablet Take 1 tablet (8 mg total) by mouth at bedtime.  . sitaGLIPtin (JANUVIA) 50 MG tablet Take 1 tablet (50 mg total) by mouth daily.  . sucralfate (CARAFATE) 1 g tablet Take 1 tablet (1 g total) by mouth 4 (four) times daily -  with meals and at bedtime.  . triamcinolone cream (KENALOG) 0.1 % Apply 1 application topically 2 (two) times daily.   Allergies  Allergen Reactions  . Abilify [Aripiprazole] Other (See Comments)    Jerking movement, slow motor skills  . Augmentin [Amoxicillin-Pot Clavulanate] Diarrhea and Nausea And Vomiting  . Bactrim [Sulfamethoxazole-Trimethoprim] Diarrhea  . Ceftin [Cefuroxime Axetil] Diarrhea  . Coconut Oil     Rash   . Diclofenac Other (See Comments)     Muscle Pain  . Dye Fdc Red [Red Dye]     "hair dye"  . Indocin [Indomethacin] Other (See Comments)    Migraines   . Linaclotide Diarrhea  . Lisinopril Diarrhea and Other (See Comments)    Other reaction(s): Dizziness, Vomiting  . Morphine Other (See Comments)    Chest Tightness  . Morphine  And Related Other (See  Comments)    Chest tightness   . Simvastatin Other (See Comments)    Muscle Pain  . Sulfa Antibiotics Diarrhea  . Sulfamethoxazole-Trimethoprim Diarrhea  . Wellbutrin [Bupropion] Other (See Comments)    Jerking movement Jerking movement Slurred speech  . Zetia [Ezetimibe]     Diarrhea   . Quetiapine Fumarate Palpitations  . Quetiapine Fumarate Palpitations   Recent Results (from the past 2160 hour(s))  Comprehensive metabolic panel     Status: Abnormal   Collection Time: 08/21/17 11:18 AM  Result Value Ref Range   Sodium 139 135 - 145 mEq/L   Potassium 3.7 3.5 - 5.1 mEq/L   Chloride 103 96 - 112 mEq/L   CO2 28 19 - 32 mEq/L   Glucose, Bld 152 (H) 70 - 99 mg/dL   BUN 8 6 - 23 mg/dL   Creatinine, Ser 0.96 0.40 - 1.20 mg/dL   Total Bilirubin 0.5 0.2 - 1.2 mg/dL   Alkaline Phosphatase 124 (H) 39 - 117 U/L   AST 22 0 - 37 U/L   ALT 23 0 - 35 U/L   Total Protein 7.8 6.0 - 8.3 g/dL   Albumin 4.3 3.5 - 5.2 g/dL   Calcium 9.5 8.4 - 10.5 mg/dL   GFR 75.40 >60.00 mL/min  CBC with Differential/Platelet     Status: Abnormal   Collection Time: 08/21/17 11:18 AM  Result Value Ref Range   WBC 6.2 4.0 - 10.5 K/uL   RBC 4.22 3.87 - 5.11 Mil/uL   Hemoglobin 12.8 12.0 - 15.0 g/dL   HCT 39.0 36.0 - 46.0 %   MCV 92.5 78.0 - 100.0 fl   MCHC 32.9 30.0 - 36.0 g/dL   RDW 14.8 11.5 - 15.5 %   Platelets 416.0 (H) 150.0 - 400.0 K/uL   Neutrophils Relative % 49.7 43.0 - 77.0 %   Lymphocytes Relative 38.6 12.0 - 46.0 %   Monocytes Relative 5.5 3.0 - 12.0 %   Eosinophils Relative 5.1 (H) 0.0 - 5.0 %   Basophils Relative 1.1 0.0 - 3.0 %   Neutro Abs 3.1 1.4 - 7.7 K/uL   Lymphs Abs 2.4 0.7 - 4.0 K/uL   Monocytes Absolute 0.3 0.1 - 1.0 K/uL   Eosinophils Absolute 0.3 0.0 - 0.7 K/uL   Basophils Absolute 0.1 0.0 - 0.1 K/uL  Urinalysis, Routine w reflex microscopic     Status: Abnormal   Collection Time: 08/21/17 11:18 AM  Result Value Ref Range   Color, Urine YELLOW Yellow;Lt. Yellow    APPearance CLEAR Clear   Specific Gravity, Urine >=1.030 (A) 1.000 - 1.030   pH 5.5 5.0 - 8.0   Total Protein, Urine TRACE (A) Negative   Urine Glucose NEGATIVE Negative   Ketones, ur NEGATIVE Negative   Bilirubin Urine SMALL (A) Negative   Hgb urine dipstick NEGATIVE Negative   Urobilinogen, UA 1.0 0.0 - 1.0   Leukocytes, UA NEGATIVE Negative   Nitrite NEGATIVE Negative   WBC, UA 3-6/hpf (A) 0-2/hpf   RBC / HPF 0-2/hpf 0-2/hpf   Squamous Epithelial / LPF Many(>10/hpf) (A) Rare(0-4/hpf)   Bacteria, UA Many(>50/hpf) (A) None   Ca Oxalate Crys, UA Presence of (A) None  Lipid panel     Status: Abnormal   Collection Time: 08/21/17 11:18 AM  Result Value Ref Range   Cholesterol 304 (H) 0 - 200 mg/dL    Comment: ATP III Classification       Desirable:  < 200 mg/dL  Borderline High:  200 - 239 mg/dL          High:  > = 240 mg/dL   Triglycerides 190.0 (H) 0.0 - 149.0 mg/dL    Comment: Normal:  <150 mg/dLBorderline High:  150 - 199 mg/dL   HDL 38.70 (L) >39.00 mg/dL   VLDL 38.0 0.0 - 40.0 mg/dL   LDL Cholesterol 227 (H) 0 - 99 mg/dL   Total CHOL/HDL Ratio 8     Comment:                Men          Women1/2 Average Risk     3.4          3.3Average Risk          5.0          4.42X Average Risk          9.6          7.13X Average Risk          15.0          11.0                       NonHDL 265.15     Comment: NOTE:  Non-HDL goal should be 30 mg/dL higher than patient's LDL goal (i.e. LDL goal of < 70 mg/dL, would have non-HDL goal of < 100 mg/dL)  T4, free     Status: None   Collection Time: 08/21/17 11:18 AM  Result Value Ref Range   Free T4 0.81 0.60 - 1.60 ng/dL    Comment: Specimens from patients who are undergoing biotin therapy and /or ingesting biotin supplements may contain high levels of biotin.  The higher biotin concentration in these specimens interferes with this Free T4 assay.  Specimens that contain high levels  of biotin may cause false high results for this Free  T4 assay.  Please interpret results in light of the total clinical presentation of the patient.    TSH     Status: Abnormal   Collection Time: 08/21/17 11:18 AM  Result Value Ref Range   TSH 7.72 (H) 0.35 - 4.50 uIU/mL  D-Dimer, Quantitative     Status: None   Collection Time: 08/21/17 11:18 AM  Result Value Ref Range   D-Dimer, Quant 0.43 <0.50 mcg/mL FEU    Comment: . The D-Dimer test is used frequently to exclude an acute PE or DVT. In patients with a low to moderate clinical risk assessment and a D-Dimer result <0.50 mcg/mL FEU, the likelihood of a PE or DVT is very low. However, a thromboembolic event should not be excluded solely on the basis of the D-Dimer level. Increased levels of D-Dimer are associated with a PE, DVT, DIC, malignancies, inflammation, sepsis, surgery, trauma, pregnancy, and advancing patient age. [Jama 2006 11:295(2):199-207] . For additional information, please refer to: http://education.quest[facility].com/faq/FAQ149 (This link is being provided for informational/ educational purposes only) .   Troponin I     Status: None   Collection Time: 08/21/17 11:55 AM  Result Value Ref Range   Troponin I <0.01 < OR = 0.0 ng/mL    Comment: . In accord with published recommendations, serial testing of troponin I at intervals of 2 to 4 hours for up to 12 to 24 hours is suggested in order to corroborate a single troponin I result. An elevated troponin alone is not sufficient to make the diagnosis of MI. Marland Kitchen   Hemoglobin  A1c     Status: Abnormal   Collection Time: 08/28/17 10:35 AM  Result Value Ref Range   Hgb A1c MFr Bld 7.2 (H) 4.6 - 6.5 %    Comment: Glycemic Control Guidelines for People with Diabetes:Non Diabetic:  <6%Goal of Therapy: <7%Additional Action Suggested:  >8%   Thyroid peroxidase antibody     Status: Abnormal   Collection Time: 08/28/17 10:35 AM  Result Value Ref Range   Thyroperoxidase Ab SerPl-aCnc 882 (H) <9 IU/mL  T3, free      Status: None   Collection Time: 08/28/17 10:35 AM  Result Value Ref Range   T3, Free 2.9 2.3 - 4.2 pg/mL  Pro b natriuretic peptide     Status: None   Collection Time: 09/09/17  1:23 PM  Result Value Ref Range   NT-Pro BNP 78 0 - 287 pg/mL    Comment: The following cut-points have been suggested for the use of proBNP for the diagnostic evaluation of heart failure (HF) in patients with acute dyspnea: Modality                     Age           Optimal Cut                            (years)            Point ------------------------------------------------------ Diagnosis (rule in HF)        <50            450 pg/mL                           50 - 75            900 pg/mL                               >75           1800 pg/mL Exclusion (rule out HF)  Age independent     300 pg/mL   Myocardial Perfusion Imaging     Status: None   Collection Time: 09/15/17 12:33 PM  Result Value Ref Range   Rest HR 77 bpm   Rest BP 142/84 mmHg   Peak HR 96 bpm   Peak BP 153/106 mmHg   SSS 3    SRS 3    SDS 0    LHR 0.33    TID 0.93    LV sys vol 34 mL   LV dias vol 95 46 - 106 mL  POCT Urinalysis Dipstick     Status: Abnormal   Collection Time: 10/23/17 10:27 AM  Result Value Ref Range   Color, UA yellow    Clarity, UA clear    Glucose, UA negative    Bilirubin, UA positive    Ketones, UA negative    Spec Grav, UA >=1.030 (A) 1.010 - 1.025   Blood, UA negative    pH, UA 5.0 5.0 - 8.0   Protein, UA negative    Urobilinogen, UA 1.0 0.2 or 1.0 E.U./dL   Nitrite, UA negative    Leukocytes, UA Negative Negative   Appearance     Odor    Hepatic function panel     Status: None   Collection Time: 10/23/17 10:40 AM  Result Value Ref Range  Total Bilirubin 0.4 0.2 - 1.2 mg/dL   Bilirubin, Direct 0.1 0.0 - 0.3 mg/dL   Alkaline Phosphatase 99 39 - 117 U/L   AST 25 0 - 37 U/L   ALT 21 0 - 35 U/L   Total Protein 7.5 6.0 - 8.3 g/dL   Albumin 4.0 3.5 - 5.2 g/dL  Gamma GT     Status: Abnormal    Collection Time: 10/23/17 10:40 AM  Result Value Ref Range   GGT 61 (H) 7 - 51 U/L  TSH     Status: None   Collection Time: 11/05/17  4:48 PM  Result Value Ref Range   TSH 1.95 0.35 - 4.50 uIU/mL   Objective  Body mass index is 34.93 kg/m. Wt Readings from Last 3 Encounters:  11/05/17 216 lb 6.4 oz (98.2 kg)  10/23/17 219 lb 9.6 oz (99.6 kg)  10/06/17 217 lb 12.8 oz (98.8 kg)   Temp Readings from Last 3 Encounters:  11/05/17 98 F (36.7 C) (Oral)  10/23/17 97.9 F (36.6 C) (Oral)  10/06/17 98.6 F (37 C) (Oral)   BP Readings from Last 3 Encounters:  11/05/17 108/60  10/23/17 110/72  10/06/17 114/79   Pulse Readings from Last 3 Encounters:  11/05/17 89  10/23/17 80  10/06/17 (!) 116   O2 sat room air 95%  Physical Exam  Constitutional: She is oriented to person, place, and time and well-developed, well-nourished, and in no distress. Vital signs are normal.  HENT:  Head: Normocephalic and atraumatic.  Mouth/Throat: Oropharynx is clear and moist and mucous membranes are normal.  Eyes: Conjunctivae are normal. Pupils are equal, round, and reactive to light.  Cardiovascular: Normal rate, regular rhythm and normal heart sounds.  Pulmonary/Chest: Effort normal and breath sounds normal.  Musculoskeletal:       Lumbar back: She exhibits tenderness.  Moderate ttp low back  Neg st8 leg test b/l   Neurological: She is alert and oriented to person, place, and time. Gait normal.  Walking with rolling walker today   Skin: Skin is warm, dry and intact.  Psychiatric: Mood, memory, affect and judgment normal.  Nursing note and vitals reviewed.   Assessment   1. Low back pain with leg weakness and left radiculopathy MRI + moderate central stenosis and right foraminal narrowing L4/5 2. Hypothyroidism  Plan  1. Pt declines narcotics Contin Tylenol prn, trial of prednisone  Refer to Dr. Lynann Bologna in [place] advised pt to get copy of disc  Cancel NS referral as pt did not hear from  referral  Continue to walk with walker  2. Check TSH today   Provider: Dr. Olivia Mackie McLean-Scocuzza-Internal Medicine

## 2017-11-14 ENCOUNTER — Other Ambulatory Visit: Payer: Self-pay | Admitting: Family Medicine

## 2017-11-14 DIAGNOSIS — K224 Dyskinesia of esophagus: Secondary | ICD-10-CM

## 2017-11-15 ENCOUNTER — Other Ambulatory Visit: Payer: Self-pay | Admitting: Cardiovascular Disease

## 2017-11-15 DIAGNOSIS — Z760 Encounter for issue of repeat prescription: Secondary | ICD-10-CM

## 2017-11-16 ENCOUNTER — Other Ambulatory Visit (INDEPENDENT_AMBULATORY_CARE_PROVIDER_SITE_OTHER): Payer: Medicare HMO

## 2017-11-16 ENCOUNTER — Other Ambulatory Visit: Payer: Self-pay

## 2017-11-16 DIAGNOSIS — R748 Abnormal levels of other serum enzymes: Secondary | ICD-10-CM | POA: Diagnosis not present

## 2017-11-16 DIAGNOSIS — K224 Dyskinesia of esophagus: Secondary | ICD-10-CM

## 2017-11-16 LAB — HEPATIC FUNCTION PANEL
ALT: 36 U/L — ABNORMAL HIGH (ref 0–35)
AST: 32 U/L (ref 0–37)
Albumin: 3.9 g/dL (ref 3.5–5.2)
Alkaline Phosphatase: 97 U/L (ref 39–117)
Bilirubin, Direct: 0.1 mg/dL (ref 0.0–0.3)
Total Bilirubin: 0.3 mg/dL (ref 0.2–1.2)
Total Protein: 7.4 g/dL (ref 6.0–8.3)

## 2017-11-16 LAB — GAMMA GT: GGT: 60 U/L — ABNORMAL HIGH (ref 7–51)

## 2017-11-16 IMAGING — CR DG CHEST 2V
2 series · 2 of 2 positions shown · non-contrast
Comparison: 08/15/2015

CLINICAL DATA: Chest pain and shortness breath for 4 days. Nausea
and diaphoresis.

EXAM:
CHEST  2 VIEW

[chest pa]
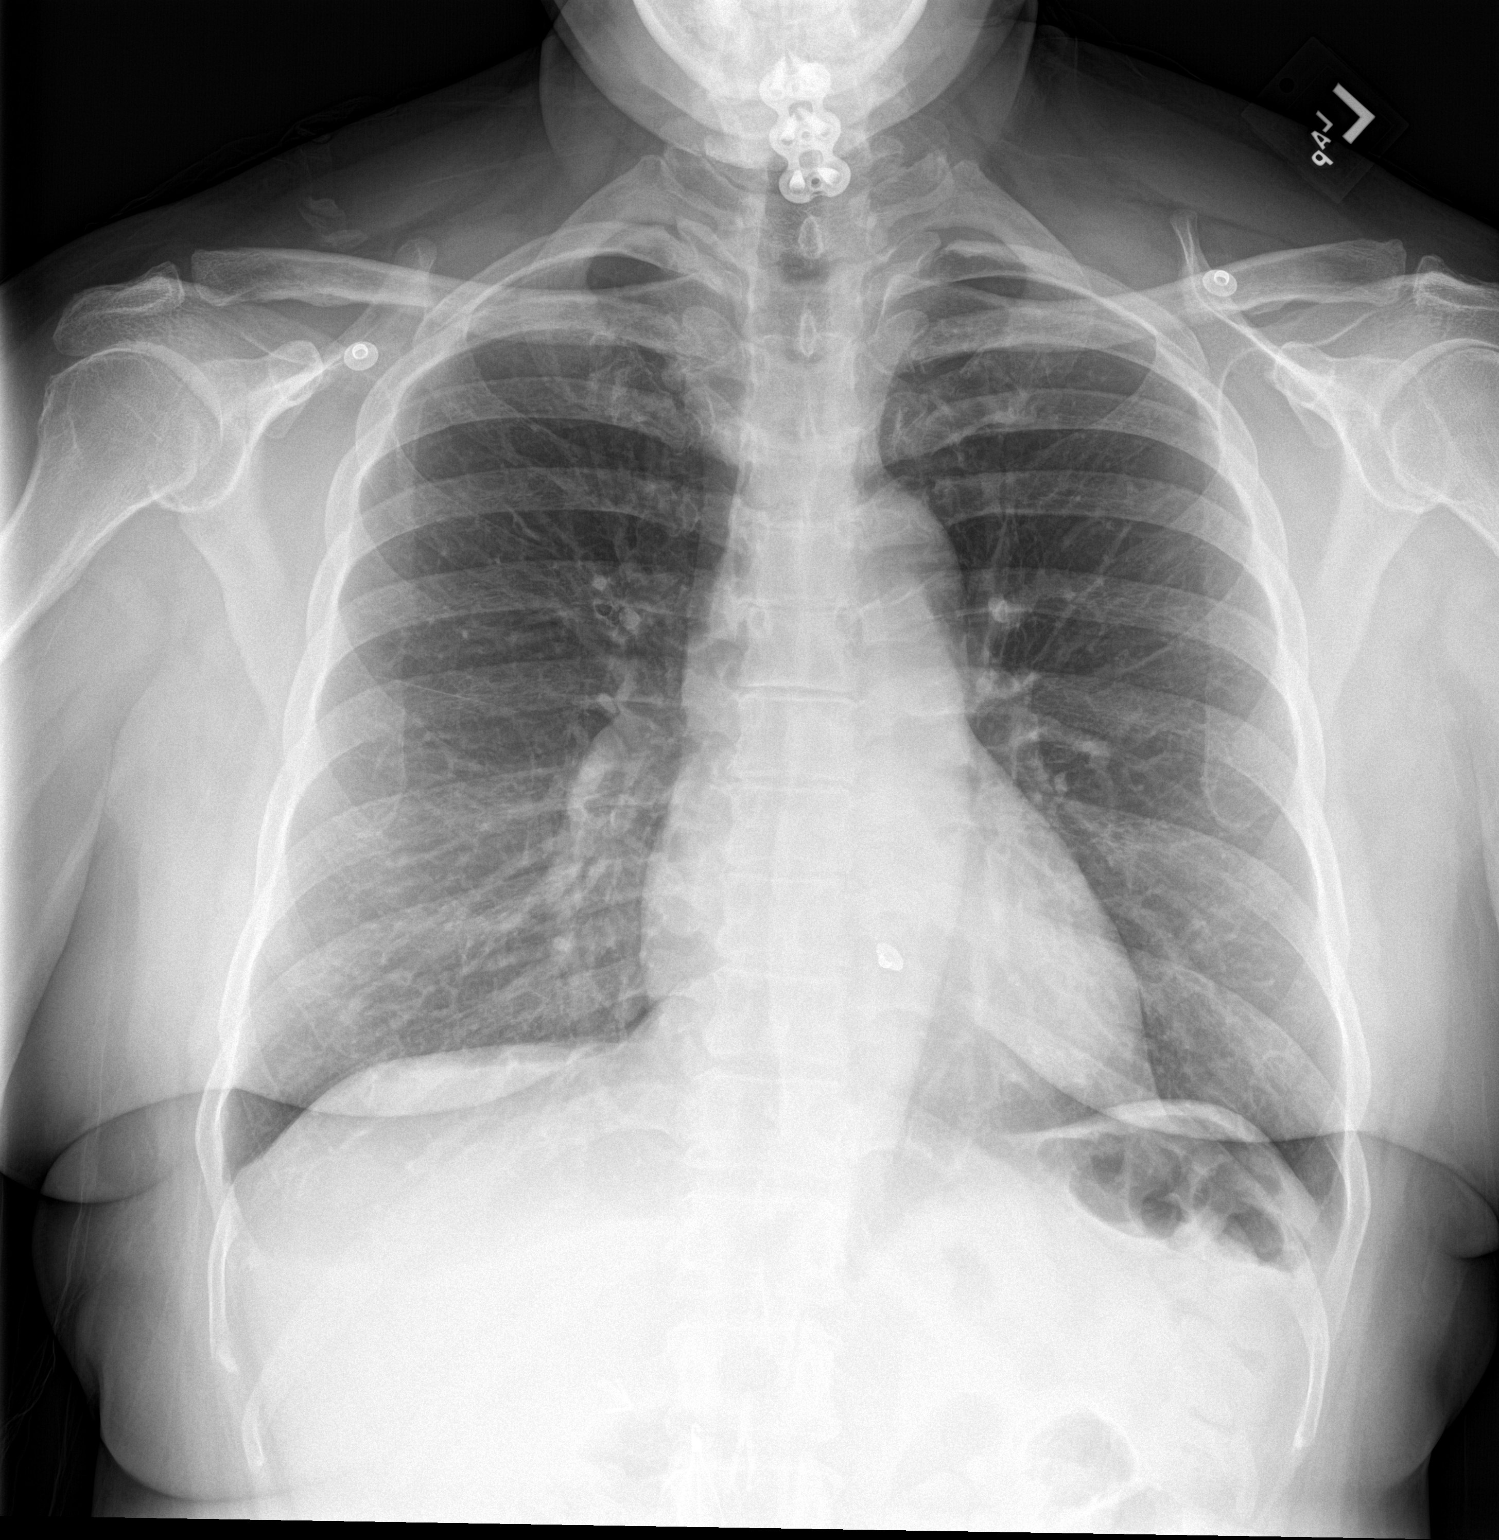

[chest lat]
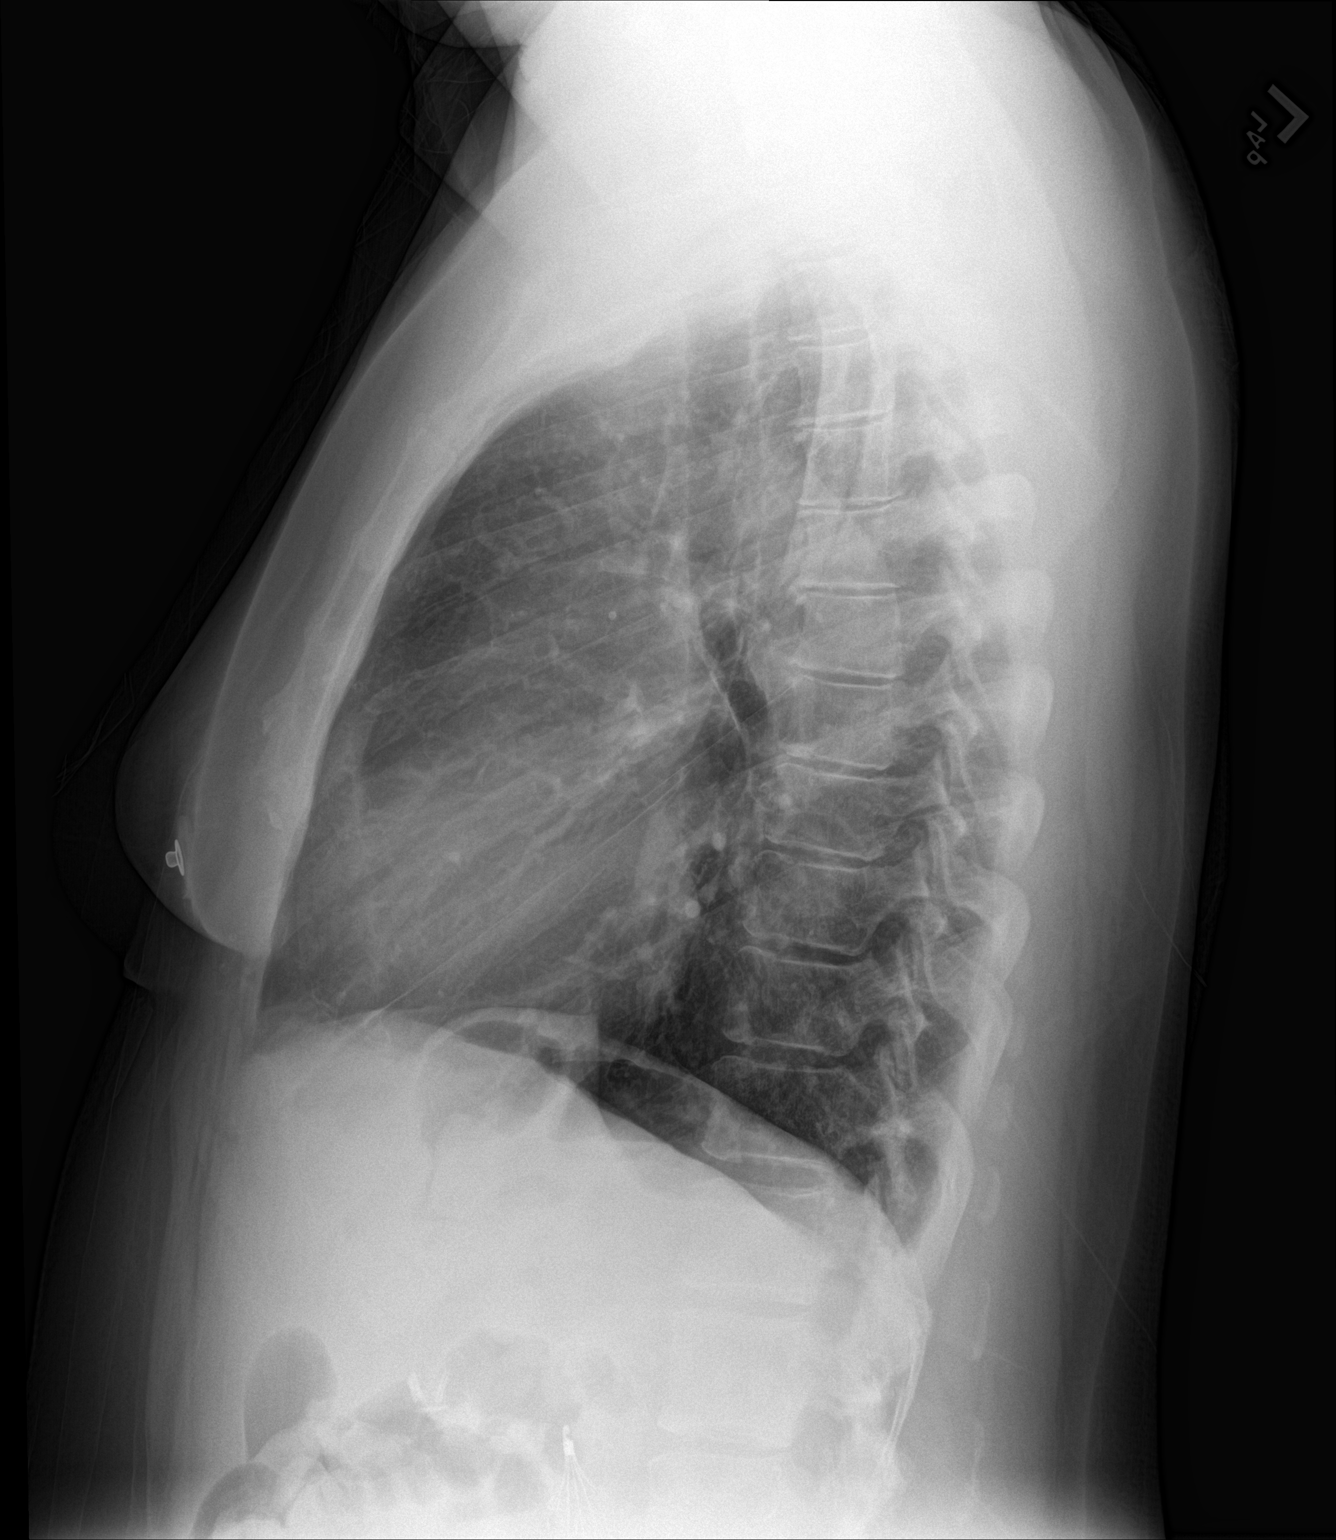

[2 of 2 positions shown; findings below may reference images not displayed]

FINDINGS: The heart size and mediastinal contours are within normal limits.
Both lungs are clear. No evidence of pleural effusion or
pneumothorax. Again noted are cervical spine fusion hardware, IVC
filter, right upper quadrant surgical clips.
IMPRESSION: No active cardiopulmonary disease.

## 2017-11-16 MED ORDER — NIFEDIPINE 10 MG PO CAPS: 10.0000 mg | ORAL_CAPSULE | Freq: Two times a day (BID) | ORAL | 0 refills | Status: DC

## 2017-11-16 NOTE — Telephone Encounter (Signed)
Last OV 10/23/17 last filled 10/09/17 90 0rf

## 2017-11-17 ENCOUNTER — Encounter: Payer: Self-pay | Admitting: Family Medicine

## 2017-11-17 ENCOUNTER — Other Ambulatory Visit: Payer: Self-pay | Admitting: Family Medicine

## 2017-11-17 DIAGNOSIS — K224 Dyskinesia of esophagus: Secondary | ICD-10-CM

## 2017-11-17 DIAGNOSIS — R748 Abnormal levels of other serum enzymes: Secondary | ICD-10-CM

## 2017-11-18 ENCOUNTER — Telehealth: Payer: Self-pay

## 2017-11-18 DIAGNOSIS — F99 Mental disorder, not otherwise specified: Principal | ICD-10-CM

## 2017-11-18 DIAGNOSIS — F5105 Insomnia due to other mental disorder: Secondary | ICD-10-CM

## 2017-11-18 MED ORDER — RAMELTEON 8 MG PO TABS: 8.0000 mg | ORAL_TABLET | Freq: Every day | ORAL | 1 refills | Status: DC

## 2017-11-18 NOTE — Telephone Encounter (Signed)
received fax requsting a 90 day supply of the rozerem 8mg . pt was last seen on  10-06-17 next appt  12-03-17   ramelteon (ROZEREM) 8 MG tablet  Medication  Date: 10/06/2017 Department: [facility] Ordering/Authorizing: Ursula Alert, MD  Order Providers   Prescribing Provider Encounter Provider  Ursula Alert, MD Ursula Alert, MD  Medication Detail    Disp Refills Start End   ramelteon (ROZEREM) 8 MG tablet 30 tablet 1 10/06/2017    Sig - Route: Take 1 tablet (8 mg total) by mouth at bedtime. - Oral   Sent to pharmacy as: ramelteon (ROZEREM) 8 MG tablet   E-Prescribing Status: Receipt confirmed by pharmacy (10/06/2017 2:42 PM EST)

## 2017-11-18 NOTE — Telephone Encounter (Signed)
Can we appeal? 

## 2017-11-18 NOTE — Telephone Encounter (Signed)
received a notice that insurance prefers zolpidem that the rozerem is a non formulary drug for pt insurance.

## 2017-11-19 NOTE — Telephone Encounter (Signed)
Did you re send this? After I printed it?

## 2017-11-19 NOTE — Telephone Encounter (Signed)
Sent to pharmacy. Please check to see if diltiazem is off back order. Thanks.

## 2017-11-23 NOTE — Telephone Encounter (Signed)
Per pharmacy this is still on backorder

## 2017-11-24 ENCOUNTER — Telehealth: Payer: Self-pay | Admitting: Family Medicine

## 2017-11-24 DIAGNOSIS — Z95828 Presence of other vascular implants and grafts: Secondary | ICD-10-CM

## 2017-11-24 NOTE — Telephone Encounter (Signed)
Please advise 

## 2017-11-24 NOTE — Telephone Encounter (Signed)
I have placed a referral

## 2017-11-24 NOTE — Telephone Encounter (Signed)
Copied from [place] [phone]. Topic: Referral - Request >> Nov 24, 2017 11:10 AM Robina Ade, Helene Kelp D wrote: Reason for CRM: Patient called and would like an order to be put in to check her IBC filter. She thinks she needs a CT scan or what Dr. Caryl Bis suggest. Please call patient back, thanks.

## 2017-11-24 NOTE — Telephone Encounter (Signed)
Patient states she read that sometimes they could break up and migrate. Patient has not seen vascular surgery but would like a referral.

## 2017-11-24 NOTE — Telephone Encounter (Signed)
Please see if there is a particular reason she needs follow-up on this.  Please also find out if she has seen vascular surgery previously as they would be the ones to chronically follow her IVC filter.  Thanks.

## 2017-11-26 ENCOUNTER — Telehealth: Payer: Self-pay | Admitting: Family Medicine

## 2017-11-26 NOTE — Telephone Encounter (Signed)
Verified with patient that she is requesting a referral to specialist in [place], she states she does because he has successfully removed IV filters. Informed patient that we recommend her to be seen locally first. Patient states that would be ok. Referral has already been placed.

## 2017-11-26 NOTE — Telephone Encounter (Signed)
Noted. Would consider referral to Dr Andreas Ohm once she discusses with vascular surgery locally.

## 2017-11-26 NOTE — Telephone Encounter (Signed)
Copied from [place] [phone]. Topic: Quick Communication - See Telephone Encounter >> Nov 26, 2017 10:41 AM Boyd Kerbs wrote: CRM for notification.   Pt. Is asking for the vascular referral be to Dr. Roxy Horseman, [facility] in [place].. Has clinic specialist for IV filter removal.  She will need a scan sent to them with referral.   [phone]  See Telephone encounter for: 11/26/17.

## 2017-11-27 NOTE — Telephone Encounter (Signed)
Please advise 

## 2017-11-27 NOTE — Telephone Encounter (Signed)
Pt would like to have referral to someone locally that is not in Dr Burnett Harry office or [place] [facility].   Please call [phone]
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
## 2017-11-30 NOTE — Telephone Encounter (Signed)
Please advise 

## 2017-11-30 NOTE — Telephone Encounter (Signed)
She would have to go to [place] or [place] or [place].  [facility] are the only [place] vascular surgeons in [place].  I am happy to refer to 1 of these places though we could also refer her to the physician at [facility] if she would prefer to do that.

## 2017-11-30 NOTE — Telephone Encounter (Signed)
Tried calling, no voicemail. Ok for pec to speak to patient about message below 

## 2017-12-01 ENCOUNTER — Other Ambulatory Visit: Payer: Self-pay | Admitting: Internal Medicine

## 2017-12-01 DIAGNOSIS — E119 Type 2 diabetes mellitus without complications: Secondary | ICD-10-CM

## 2017-12-01 DIAGNOSIS — K219 Gastro-esophageal reflux disease without esophagitis: Secondary | ICD-10-CM

## 2017-12-01 MED ORDER — SITAGLIPTIN PHOSPHATE 50 MG PO TABS: 50.0000 mg | ORAL_TABLET | Freq: Every day | ORAL | 0 refills | Status: DC

## 2017-12-01 MED ORDER — SUCRALFATE 1 G PO TABS: 1.0000 g | ORAL_TABLET | Freq: Three times a day (TID) | ORAL | 0 refills | Status: DC

## 2017-12-02 NOTE — Telephone Encounter (Signed)
Patient states that is fine she just does not want to go to [facility]

## 2017-12-02 NOTE — Telephone Encounter (Signed)
Noted.  Referral already in place.  I will forward to Melissa to send to vascular surgery in [place].

## 2017-12-03 ENCOUNTER — Ambulatory Visit: Payer: Medicare HMO | Admitting: Psychiatry

## 2017-12-03 NOTE — Telephone Encounter (Signed)
Referral sent to vein and vascular specialist in [place]

## 2017-12-04 DIAGNOSIS — M4696 Unspecified inflammatory spondylopathy, lumbar region: Secondary | ICD-10-CM | POA: Diagnosis not present

## 2017-12-08 ENCOUNTER — Encounter: Payer: Self-pay | Admitting: Psychiatry

## 2017-12-08 ENCOUNTER — Other Ambulatory Visit: Payer: Self-pay

## 2017-12-08 ENCOUNTER — Ambulatory Visit (INDEPENDENT_AMBULATORY_CARE_PROVIDER_SITE_OTHER): Payer: Medicare HMO | Admitting: Psychiatry

## 2017-12-08 ENCOUNTER — Encounter: Payer: Self-pay | Admitting: Family Medicine

## 2017-12-08 VITALS — BP 126/82 | HR 97 | Temp 98.7°F | Wt 219.2 lb

## 2017-12-08 DIAGNOSIS — F5105 Insomnia due to other mental disorder: Secondary | ICD-10-CM | POA: Diagnosis not present

## 2017-12-08 DIAGNOSIS — F99 Mental disorder, not otherwise specified: Secondary | ICD-10-CM

## 2017-12-08 DIAGNOSIS — F411 Generalized anxiety disorder: Secondary | ICD-10-CM

## 2017-12-08 DIAGNOSIS — F250 Schizoaffective disorder, bipolar type: Secondary | ICD-10-CM | POA: Diagnosis not present

## 2017-12-08 DIAGNOSIS — R69 Illness, unspecified: Secondary | ICD-10-CM | POA: Diagnosis not present

## 2017-12-08 MED ORDER — GABAPENTIN 600 MG PO TABS: 600.0000 mg | ORAL_TABLET | Freq: Two times a day (BID) | ORAL | 1 refills | Status: DC

## 2017-12-08 MED ORDER — OLANZAPINE 10 MG PO TABS: 10.0000 mg | ORAL_TABLET | Freq: Every day | ORAL | 1 refills | Status: DC

## 2017-12-08 MED ORDER — ESCITALOPRAM OXALATE 20 MG PO TABS: 20.0000 mg | ORAL_TABLET | Freq: Every day | ORAL | 1 refills | Status: DC
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
## 2017-12-09 ENCOUNTER — Encounter: Payer: Self-pay | Admitting: Psychiatry

## 2017-12-11 ENCOUNTER — Ambulatory Visit: Payer: Self-pay | Admitting: *Deleted

## 2017-12-11 ENCOUNTER — Emergency Department
Admission: EM | Admit: 2017-12-11 | Discharge: 2017-12-11 | Disposition: A | Discharge status: Home / Self Care | Payer: Medicare HMO | Attending: Emergency Medicine | Admitting: Emergency Medicine

## 2017-12-11 ENCOUNTER — Emergency Department: Payer: Medicare HMO

## 2017-12-11 DIAGNOSIS — E119 Type 2 diabetes mellitus without complications: Secondary | ICD-10-CM | POA: Insufficient documentation

## 2017-12-11 DIAGNOSIS — I10 Essential (primary) hypertension: Secondary | ICD-10-CM | POA: Diagnosis not present

## 2017-12-11 DIAGNOSIS — Z87891 Personal history of nicotine dependence: Secondary | ICD-10-CM | POA: Diagnosis not present

## 2017-12-11 DIAGNOSIS — W19XXXA Unspecified fall, initial encounter: Secondary | ICD-10-CM

## 2017-12-11 DIAGNOSIS — E039 Hypothyroidism, unspecified: Secondary | ICD-10-CM | POA: Insufficient documentation

## 2017-12-11 DIAGNOSIS — M47816 Spondylosis without myelopathy or radiculopathy, lumbar region: Secondary | ICD-10-CM | POA: Diagnosis not present

## 2017-12-11 DIAGNOSIS — M25552 Pain in left hip: Secondary | ICD-10-CM | POA: Insufficient documentation

## 2017-12-11 DIAGNOSIS — Z79899 Other long term (current) drug therapy: Secondary | ICD-10-CM | POA: Insufficient documentation

## 2017-12-11 DIAGNOSIS — I2510 Atherosclerotic heart disease of native coronary artery without angina pectoris: Secondary | ICD-10-CM | POA: Insufficient documentation

## 2017-12-11 DIAGNOSIS — S79912A Unspecified injury of left hip, initial encounter: Secondary | ICD-10-CM | POA: Diagnosis not present

## 2017-12-11 NOTE — ED Triage Notes (Signed)
Pt reports that she fell yesterday and almost fell again x2 today.  Pt states that her L hip is "giving out".  Pt reports that she has an artificial hip and that she is concerned that it has been knocked out.  Pt ambulatory to triage with walker and with steady gait.

## 2017-12-11 NOTE — Telephone Encounter (Signed)
FYI patient going to ED 

## 2017-12-11 NOTE — Telephone Encounter (Signed)
Pt states she is currently at the [employer] and states she has almost fell x2 with standing and trying to get out of the chair. Pt states she fell on yesterday while she was sitting in her walker while at a neighbor's house. Pt states she was trying to roll backwards to move out of the way and states that she kept rolling and ended up falling out of chair and on the left side. Pt states she feels "rickety" and leg feels weak with walking and standing and is currently rating pain 5-6 on a scale of 1-10. Pt also states she had a hip replacement to the left side in 2015 and states that something does not feel right in her hip. Pt advised to seek treatment in the ED for evaluation of hip. Pt advised to call 911 or someone that can take her to the ED if she is not able to drive her self. Pt verbalized understanding.  Reason for Disposition . Sounds like a serious injury to the triager  Answer Assessment - Initial Assessment Questions 1. MECHANISM: "How did the injury happen?" (e.g., twisting injury, direct blow)      Sitting on rollator trying to push herself backwards and fell out of chari 2. ONSET: "When did the injury happen?" (Minutes or hours ago)      Yesterday 3. LOCATION: "Where is the injury located?"      Left side 4. APPEARANCE of INJURY: "What does the injury look like?"  (e.g., deformity of leg)    Leg does not appear to be deformed or swollen at this time 5. SEVERITY: "Can you put weight on that leg?" "Can you walk?"   Pt states her leg feels "rickety" and feels like it is going to give out with standing.  6. SIZE: For cuts, bruises, or swelling, ask: "How large is it?" (e.g., inches or centimeters;  entire joint)     n/a 7. PAIN: "Is there pain?" If so, ask: "How bad is the pain?"  (e.g., Scale 1-10; or mild, moderate, severe)     5-6 8. TETANUS: For any breaks in the skin, ask: "When was the last tetanus booster?"     No breaks in the skin 9. OTHER SYMPTOMS: "Do you have any  other symptoms?"      No 10. PREGNANCY: "Is there any chance you are pregnant?" "When was your last menstrual period?" n/a  Protocols used: HIP INJURY-A-AH

## 2017-12-11 NOTE — Telephone Encounter (Signed)
Noted  

## 2017-12-11 NOTE — ED Provider Notes (Signed)
[facility] Emergency Department Provider Note  ____________________________________________  Time seen: Approximately 6:23 PM  I have reviewed the triage vital signs and the nursing notes.   HISTORY  Chief Complaint Fall and Hip Pain    HPI Valerie Vazquez is a 62 y.o. female presents to the emergency department with 6 out of 10 left hip pain.  Patient reports that she experienced a fall yesterday.  Patient reports that she became concerned as she has had a total hip arthroplasty.  Patient reports that she was able to ambulate after fall.  Patient reports that total hip arthroplasty was performed by Dr. Rudene Christians.  She denies weakness or changes in sensation of the lower extremities.  No alleviating measures have been attempted.   Past Medical History:  Diagnosis Date  . Anemia   . Bilateral renal cysts   . Chronic back pain    "upper and lower" (09/19/2014)  . Depression    "major" (09/19/2014)  . DVT (deep venous thrombosis) ([place])   . Esophageal spasm   . Gallstones   . Gastritis   . Gastroenteritis   . GERD (gastroesophageal reflux disease)   . Headache    "couple /month lately" (09/19/2014)  . High blood pressure   . High cholesterol   . History of blood transfusion 1985   related to hysterectomy  . History of nuclear stress test    Myoview 1/19:  EF 65, no ischemia or scar  . Hypertension   . IBS (irritable bowel syndrome)   . Migraine    "a few times/yr" (09/19/2014)  . Myocardial infarction ([place]) 10/2010 X 3   "while hospitalized"  . Osteoarthritis    "qwhere; mostly around my joints" (09/19/2014)  . Pneumonia 04/2014  . PTSD (post-traumatic stress disorder)   . Pulmonary embolism ([place]) 04/2001; 09/19/2014   "after gallbladder OR; "  . Schizoaffective disorder ([place])   . Sleep apnea    "mild" (09/19/2014)  . Snoring    a. sleep study 5/16: No OSA  . Spinal stenosis   . Spondylosis   . Type II diabetes mellitus ([place])    "dx'd in 2007; lost  weight; no RX for ~ 4 yr now" (09/19/2014)    Patient Active Problem List   Diagnosis Date Noted  . Boil 10/27/2017  . Urinary incontinence 10/27/2017  . Elevated alkaline phosphatase level 10/27/2017  . Acute right-sided low back pain with right-sided sciatica 10/23/2017  . Subclinical hypothyroidism 08/26/2017  . Soft tissue swelling 05/29/2017  . Diabetes mellitus without complication ([place]) 17/40/8144  . Acute right eye pain 03/25/2017  . Elevated glucose 02/25/2017  . Bruise 02/25/2017  . Wheezing 02/25/2017  . Paresthesia 02/06/2017  . Falls 01/30/2017  . Colon polyp 01/30/2017  . RUQ pain 01/12/2017  . Heart disease 12/09/2016  . Plantar fasciitis of left foot 11/07/2016  . Costochondritis 10/06/2016  . BRBPR (bright red blood per rectum) 10/06/2016  . CAD (coronary artery disease) 09/28/2016  . Diarrhea 09/16/2016  . Epigastric discomfort 09/16/2016  . Schizoaffective disorder, depressive type ([place]) 08/28/2016  . Suicide attempt ([place]) 08/27/2016  . Colitis 04/08/2016  . Absolute anemia 01/29/2016  . Neck pain 01/29/2016  . Chronic shoulder pain (Location of Primary Source of Pain) (Left) 01/29/2016  . Osteoarthritis of hip (Bilateral) 01/29/2016  . Lumbar central spinal stenosis (moderate at L4-5; mild at L2-3 and L3-4) 01/29/2016  . Hx of cervical spine surgery 01/29/2016  . Numbness 11/19/2015  . Breast pain 10/19/2015  .  Chest pain 10/14/2015  . Nausea and vomiting 08/15/2015  . SOB (shortness of breath) 07/31/2015  . Pleural effusion, bilateral 07/31/2015  . Esophageal spasm 06/05/2015  . History of DVT (deep vein thrombosis) 02/25/2015  . 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 Constipation 12/12/2014  . Coronary vasospasm ([place]) 12/09/2014  . History of pulmonary embolus (PE) 09/19/2014  . Anxiety and depression 04/13/2014  . IBS (irritable bowel syndrome) 04/11/2014  . GERD (gastroesophageal reflux disease) 04/11/2014  . Insomnia 04/11/2014  . Essential  hypertension 10/28/2013  . Anemia 10/28/2013  . Hyperlipidemia 10/28/2013    Past Surgical History:  Procedure Laterality Date  . ABDOMINAL HYSTERECTOMY  1985  . ANTERIOR CERVICAL DECOMP/DISCECTOMY FUSION  10/2012   in [place] C5-C7   . APPENDECTOMY  1985  . BACK SURGERY    . BREAST CYST EXCISION Right   . BREAST EXCISIONAL BIOPSY Right YRS AGO   NEG  . CARDIAC CATHETERIZATION   2000's; 2009  . CHOLECYSTECTOMY  2002  . COLONOSCOPY WITH PROPOFOL N/A 12/12/2016   Procedure: COLONOSCOPY WITH PROPOFOL;  Surgeon: [name], MD;  Location: [facility] ENDOSCOPY;  Service: Endoscopy;  Laterality: N/A;  . COLONOSCOPY WITH PROPOFOL N/A 02/17/2017   Procedure: COLONOSCOPY WITH PROPOFOL;  Surgeon: [name], MD;  Location: [facility] ENDOSCOPY;  Service: Gastroenterology;  Laterality: N/A;  . ESOPHAGOGASTRODUODENOSCOPY (EGD) WITH PROPOFOL N/A 02/17/2017   Procedure: ESOPHAGOGASTRODUODENOSCOPY (EGD) WITH PROPOFOL;  Surgeon: [name], MD;  Location: [facility] ENDOSCOPY;  Service: Gastroenterology;  Laterality: N/A;  . EXCISION/RELEASE BURSA HIP Right 12/1984  . HERNIA REPAIR  1985  . LEFT HEART CATHETERIZATION WITH CORONARY ANGIOGRAM N/A 12/09/2014   Procedure: LEFT HEART CATHETERIZATION WITH CORONARY ANGIOGRAM;  Surgeon: Burnell Blanks, MD;  Location: [facility] CATH LAB;  Service: Cardiovascular;  Laterality: N/A;  . TONSILLECTOMY AND ADENOIDECTOMY  ~ 1968  . TOTAL HIP ARTHROPLASTY Left 04-06-2014  . UMBILICAL HERNIA REPAIR  1985  . VENA CAVA FILTER PLACEMENT  03/2014    Prior to Admission medications   Medication Sig Start Date End Date Taking? Authorizing Provider  carvedilol (COREG) 3.125 MG tablet TAKE 1 TABLET (3.125 MG TOTAL) BY MOUTH 2 (TWO) TIMES DAILY WITH A MEAL. 11/18/17   Burnell Blanks, MD  cyclobenzaprine (FLEXERIL) 10 MG tablet TAKE 1 TABLET BY MOUTH THREE TIMES A DAY AS NEEDED FOR MUSCLE SPASMS 11/16/17   [name], MD  dicyclomine (BENTYL) 20 MG tablet TAKE 1 TABLET BY MOUTH  3 TIMES A DAY AS NEEDED FOR SPASMS 08/28/17   [name], MD  diphenhydrAMINE (BENADRYL) 25 MG tablet Take 1 tablet (25 mg total) by mouth every 6 (six) hours. 09/12/16   Jola Schmidt, MD  ELIQUIS 5 MG TABS tablet TAKE 1 TABLET (5 MG TOTAL) BY MOUTH 2 (TWO) TIMES DAILY. 10/02/17   [name], MD  escitalopram (LEXAPRO) 20 MG tablet Take 1 tablet (20 mg total) by mouth daily. 12/08/17   Ursula Alert, MD  gabapentin (NEURONTIN) 600 MG tablet Take 1-1.5 tablets (600-900 mg total) by mouth 2 (two) times daily. Take 1 tablet in the AM and 1.5 tablet at bedtime 12/08/17   Ursula Alert, MD  levothyroxine (SYNTHROID, LEVOTHROID) 25 MCG tablet Take 1 tablet (25 mcg total) by mouth daily before breakfast. Wait 2-3 hours before taking any other medication or supplement 09/30/17   McLean-Scocuzza, Nino Glow, MD  Multiple Vitamins-Minerals (SUPER THERA VITE M) TABS Take by mouth.    [provider]  neomycin-polymyxin b-dexamethasone (MAXITROL) 3.5-10000-0.1 OINT APPLY A SMALL AMOUNT  IN RIGHT EYE 2 TIMES A DAY FOR 14 DAYS 03/25/17   [provider]  NIFEdipine (PROCARDIA) 10 MG capsule TAKE 1 CAPSULE BY MOUTH 2 TIMES DAILY. TAKE 30 MINS BEFORE MEALS UP TO 1X PER DAY 11/19/17   [name], MD  OLANZapine (ZYPREXA) 10 MG tablet Take 1 tablet (10 mg total) by mouth at bedtime. 12/08/17   Ursula Alert, MD  ondansetron (ZOFRAN) 4 MG tablet Take 1 tablet (4 mg total) by mouth 2 (two) times daily as needed for nausea or vomiting. 09/18/17   McLean-Scocuzza, Nino Glow, MD  pantoprazole (PROTONIX) 40 MG tablet TAKE 1 TABLET BY MOUTH EVERY DAY 07/28/17   [name], MD  pravastatin (PRAVACHOL) 10 MG tablet Take 1 tablet (10 mg total) by mouth at bedtime. 09/18/17   McLean-Scocuzza, Nino Glow, MD  ramelteon (ROZEREM) 8 MG tablet Take 1 tablet (8 mg total) by mouth at bedtime. 11/18/17   Ursula Alert, MD  sitaGLIPtin (JANUVIA) 50 MG tablet Take 1 tablet (50 mg total) by mouth daily.  12/01/17   McLean-Scocuzza, Nino Glow, MD    Allergies Abilify [aripiprazole]; Augmentin [amoxicillin-pot clavulanate]; Bactrim [sulfamethoxazole-trimethoprim]; Ceftin [cefuroxime axetil]; Coconut oil; Diclofenac; Dye fdc red [red dye]; Indocin [indomethacin]; Linaclotide; Lisinopril; Morphine; Morphine and related; Simvastatin; Sulfa antibiotics; Sulfamethoxazole-trimethoprim; Wellbutrin [bupropion]; Zetia [ezetimibe]; Quetiapine fumarate; and Quetiapine fumarate  Family History  Problem Relation Age of Onset  . Stroke Mother        Deceased  . Lung cancer Father        Deceased  . Healthy Daughter   . Healthy Son        x 2  . Other Son        Suicide  . Heart attack Neg Hx     Social History Social History   Tobacco Use  . Smoking status: Former Smoker    Packs/day: 1.50    Years: 10.00    Pack years: 15.00    Types: Cigarettes    Last attempt to quit: 04/12/1979    Years since quitting: 38.6  . Smokeless tobacco: Never Used  Substance Use Topics  . Alcohol use: No    Alcohol/week: 0.0 oz  . Drug use: No     Review of Systems  Constitutional: No fever/chills Eyes: No visual changes. No discharge ENT: No upper respiratory complaints. Cardiovascular: no chest pain. Respiratory: no cough. No SOB. Gastrointestinal: No abdominal pain.  No nausea, no vomiting.  No diarrhea.  No constipation. Musculoskeletal: patient has left hip pain. Skin: Negative for rash, abrasions, lacerations, ecchymosis. Neurological: Negative for headaches, focal weakness or numbness.   ____________________________________________   PHYSICAL EXAM:  VITAL SIGNS: ED Triage Vitals  Enc Vitals Group     BP 12/11/17 1538 (!) 141/78     Pulse Rate 12/11/17 1538 83     Resp 12/11/17 1538 18     Temp 12/11/17 1538 98.1 F (36.7 C)     Temp Source 12/11/17 1538 Oral     SpO2 12/11/17 1538 97 %     Weight 12/11/17 1540 219 lb (99.3 kg)     Height 12/11/17 1540 5\' 6"  (1.676 m)     Head  Circumference --      Peak Flow --      Pain Score 12/11/17 1540 5     Pain Loc --      Pain Edu? --      Excl. in [place]? --      Constitutional: Alert and  oriented. Well appearing and in no acute distress. Eyes: Conjunctivae are normal. PERRL. EOMI. Head: Atraumatic. Cardiovascular: Normal rate, regular rhythm. Normal S1 and S2.  Good peripheral circulation. Respiratory: Normal respiratory effort without tachypnea or retractions. Lungs CTAB. Good air entry to the bases with no decreased or absent breath sounds. Musculoskeletal: Patient has no groin pain elicited with internal and external rotation at the left hip.  Full range of motion is performed at the left knee.  Palpable dorsalis pedis pulse, left. Neurologic:  Normal speech and language. No gross focal neurologic deficits are appreciated.  Skin:  Skin is warm, dry and intact. No rash noted. Psychiatric: Mood and affect are normal. Speech and behavior are normal. Patient exhibits appropriate insight and judgement.   ____________________________________________   LABS (all labs ordered are listed, but only abnormal results are displayed)  Labs Reviewed - No data to display ____________________________________________  EKG   ____________________________________________  RADIOLOGY Unk Pinto, personally viewed and evaluated these images (plain radiographs) as part of my medical decision making, as well as reviewing the written report by the radiologist.  Dg Hip Unilat With Pelvis 2-3 Views Left  Result Date: 12/11/2017 CLINICAL DATA:  Status post fall, left hip pain EXAM: DG HIP (WITH OR WITHOUT PELVIS) 2-3V LEFT COMPARISON:  07/07/2015 FINDINGS: No acute fracture or dislocation. Left hip arthroplasty without failure or complication. Mild osteoarthritis of the right hip. No aggressive osseous lesion. IMPRESSION: No acute osseous injury of the left hip. Electronically Signed   By: Kathreen Devoid   On: 12/11/2017 16:28     ____________________________________________    PROCEDURES  Procedure(s) performed:    Procedures    Medications - No data to display   ____________________________________________   INITIAL IMPRESSION / ASSESSMENT AND PLAN / ED COURSE  Pertinent labs & imaging results that were available during my care of the patient were reviewed by me and considered in my medical decision making (see chart for details).  Review of the [place] CSRS was performed in accordance of the [place] prior to dispensing any controlled drugs.     Assessment and plan Left hip pain Patient presents to the emergency department with left hip pain after a fall.  Differential diagnosis included disruption of total hip arthroplasty hardware versus fracture versus hip contusion.  No acute fractures were identified on x-ray.  Patient reports that she is unable to tolerate anti-inflammatories due to current use of blood thinners.  Patient was advised to use Tylenol as needed for pain and to follow-up with Dr. Rudene Christians as needed.  Vital signs are reassuring prior to discharge.  All patient questions were answered.   ____________________________________________  FINAL CLINICAL IMPRESSION(S) / ED DIAGNOSES  Final diagnoses:  Fall, initial encounter      NEW MEDICATIONS STARTED DURING THIS VISIT:  ED Discharge Orders    None          This chart was dictated using voice recognition software/Dragon. Despite best efforts to proofread, errors can occur which can change the meaning. Any change was purely unintentional.    Lannie Fields, PA-C 12/11/17 1827    Nena Polio, MD 12/11/17 2024

## 2017-12-14 ENCOUNTER — Ambulatory Visit (INDEPENDENT_AMBULATORY_CARE_PROVIDER_SITE_OTHER): Payer: Medicare HMO | Admitting: Gastroenterology

## 2017-12-14 ENCOUNTER — Encounter: Payer: Self-pay | Admitting: Gastroenterology

## 2017-12-14 VITALS — BP 136/86 | HR 80 | Ht 66.0 in | Wt 218.2 lb

## 2017-12-14 DIAGNOSIS — R748 Abnormal levels of other serum enzymes: Secondary | ICD-10-CM | POA: Diagnosis not present

## 2017-12-14 NOTE — Progress Notes (Signed)
Valerie Bellows MD, MRCP(U.K) [address]  [place]  [place], [place] [address]  Main: [phone]  Fax: [phone]   Primary Care Physician: Valerie Haven, MD  Primary Gastroenterologist:  Dr. Jonathon Vazquez   Chief Complaint  Patient presents with  . Elevated Serum GGT    HPI: Valerie Vazquez is a 62 y.o. female    Summary of history : Previously been seen for constipation , GERD an dysphagia. Constipation resolved with Rx and dysohagia resolved with PPI  EGD 02/17/17-normal ( at the time of endoscopy she had no dysphagia) Colonoscopy 02/17/17 - 6 mm serrated adenoma resected  Interval history 03/16/2017 -12/14/17   She has been referred back for elavated GGT.   CT abdomen in 09/2016 showed no abnormalities of the liver .     Hepatic Function Latest Ref Rng & Units 11/16/2017 10/23/2017 08/21/2017  Total Protein 6.0 - 8.3 g/dL 7.4 7.5 7.8  Albumin 3.5 - 5.2 g/dL 3.9 4.0 4.3  AST 0 - 37 U/L 32 25 22  ALT 0 - 35 U/L 36(H) 21 23  Alk Phosphatase 39 - 117 U/L 97 99 124(H)  Total Bilirubin 0.2 - 1.2 mg/dL 0.3 0.4 0.5  Bilirubin, Direct 0.0 - 0.3 mg/dL 0.1 0.1 -    She says she has been taking acetaminophen 1300 mg daily for a year, a that time had some RUQ pain, Stopped the acetaminophen a week back and the pain has resolved. Gained weight , Denies any alcohol use.    Current Outpatient Medications  Medication Sig Dispense Refill  . carvedilol (COREG) 3.125 MG tablet TAKE 1 TABLET (3.125 MG TOTAL) BY MOUTH 2 (TWO) TIMES DAILY WITH A MEAL. 60 tablet 8  . cyclobenzaprine (FLEXERIL) 10 MG tablet TAKE 1 TABLET BY MOUTH THREE TIMES A DAY AS NEEDED FOR MUSCLE SPASMS 90 tablet 0  . dicyclomine (BENTYL) 20 MG tablet TAKE 1 TABLET BY MOUTH 3 TIMES A DAY AS NEEDED FOR SPASMS 270 tablet 0  . diphenhydrAMINE (BENADRYL) 25 MG tablet Take 1 tablet (25 mg total) by mouth every 6 (six) hours. 12 tablet 0  . ELIQUIS 5 MG TABS tablet TAKE 1 TABLET (5 MG TOTAL) BY MOUTH 2 (TWO)  TIMES DAILY. 60 tablet 5  . escitalopram (LEXAPRO) 20 MG tablet Take 1 tablet (20 mg total) by mouth daily. 90 tablet 1  . gabapentin (NEURONTIN) 600 MG tablet Take 1-1.5 tablets (600-900 mg total) by mouth 2 (two) times daily. Take 1 tablet in the AM and 1.5 tablet at bedtime 225 tablet 1  . levothyroxine (SYNTHROID, LEVOTHROID) 25 MCG tablet Take 1 tablet (25 mcg total) by mouth daily before breakfast. Wait 2-3 hours before taking any other medication or supplement 90 tablet 0  . Multiple Vitamins-Minerals (SUPER THERA VITE M) TABS Take by mouth.    . neomycin-polymyxin b-dexamethasone (MAXITROL) 3.5-10000-0.1 OINT APPLY A SMALL AMOUNT IN RIGHT EYE 2 TIMES A DAY FOR 14 DAYS  1  . NIFEdipine (PROCARDIA) 10 MG capsule TAKE 1 CAPSULE BY MOUTH 2 TIMES DAILY. TAKE 30 MINS BEFORE MEALS UP TO 1X PER DAY 60 capsule 0  . OLANZapine (ZYPREXA) 10 MG tablet Take 1 tablet (10 mg total) by mouth at bedtime. 90 tablet 1  . ondansetron (ZOFRAN) 4 MG tablet Take 1 tablet (4 mg total) by mouth 2 (two) times daily as needed for nausea or vomiting. 30 tablet 1  . pantoprazole (PROTONIX) 40 MG tablet TAKE 1 TABLET BY MOUTH EVERY DAY 60 tablet  0  . pravastatin (PRAVACHOL) 10 MG tablet Take 1 tablet (10 mg total) by mouth at bedtime. 90 tablet 1  . ramelteon (ROZEREM) 8 MG tablet Take 1 tablet (8 mg total) by mouth at bedtime. 90 tablet 1  . sitaGLIPtin (JANUVIA) 50 MG tablet Take 1 tablet (50 mg total) by mouth daily. 90 tablet 0   No current facility-administered medications for this visit.     Allergies as of 12/14/2017 - Review Complete 12/14/2017  Allergen Reaction Noted  . Abilify [aripiprazole] Other (See Comments) 09/20/2013  . Augmentin [amoxicillin-pot clavulanate] Diarrhea and Nausea And Vomiting 09/20/2013  . Bactrim [sulfamethoxazole-trimethoprim] Diarrhea 09/20/2013  . Ceftin [cefuroxime axetil] Diarrhea 09/20/2013  . Coconut oil  10/19/2015  . Diclofenac Other (See Comments) 08/01/2014  . Dye fdc  red [red dye]  01/22/2016  . Indocin [indomethacin] Other (See Comments) 09/20/2013  . Linaclotide Diarrhea 12/09/2014  . Lisinopril Diarrhea and Other (See Comments) 09/20/2013  . Morphine Other (See Comments) 12/09/2014  . Morphine and related Other (See Comments) 09/20/2013  . Simvastatin Other (See Comments) 08/01/2014  . Sulfa antibiotics Diarrhea 12/09/2014  . Sulfamethoxazole-trimethoprim Diarrhea 12/09/2014  . Wellbutrin [bupropion] Other (See Comments) 09/20/2013  . Zetia [ezetimibe]  09/18/2017  . Oxycodone-acetaminophen Itching 07/10/2014  . Quetiapine fumarate Palpitations 08/01/2014  . Quetiapine fumarate Palpitations 08/16/2014    ROS:  General: Negative for anorexia, weight loss, fever, chills, fatigue, weakness. ENT: Negative for hoarseness, difficulty swallowing , nasal congestion. CV: Negative for chest pain, angina, palpitations, dyspnea on exertion, peripheral edema.  Respiratory: Negative for dyspnea at rest, dyspnea on exertion, cough, sputum, wheezing.  GI: See history of present illness. GU:  Negative for dysuria, hematuria, urinary incontinence, urinary frequency, nocturnal urination.  Endo: Negative for unusual weight change.    Physical Examination:   BP 136/86 (BP Location: Left Arm, Patient Position: Sitting, Cuff Size: Large)   Pulse 80   Ht '5\' 6"'$  (1.676 m)   Wt 218 lb 3.2 oz (99 kg)   BMI 35.22 kg/m   General: Well-nourished, well-developed in no acute distress.  Eyes: No icterus. Conjunctivae pink. Mouth: Oropharyngeal mucosa moist and pink , no lesions erythema or exudate. Lungs: Clear to auscultation bilaterally. Non-labored. Heart: Regular rate and rhythm, no murmurs rubs or gallops.  Abdomen: Bowel sounds are normal, nontender, nondistended, no hepatosplenomegaly or masses, no abdominal bruits or hernia , no rebound or guarding.   Extremities: No lower extremity edema. No clubbing or deformities. Neuro: Alert and oriented x 3.  Grossly  intact. Skin: Warm and dry, no jaundice.   Psych: Alert and cooperative, normal mood and affect.   Imaging Studies: Dg Hip Unilat With Pelvis 2-3 Views Left  Result Date: 12/11/2017 CLINICAL DATA:  Status post fall, left hip pain EXAM: DG HIP (WITH OR WITHOUT PELVIS) 2-3V LEFT COMPARISON:  07/07/2015 FINDINGS: No acute fracture or dislocation. Left hip arthroplasty without failure or complication. Mild osteoarthritis of the right hip. No aggressive osseous lesion. IMPRESSION: No acute osseous injury of the left hip. Electronically Signed   By: Kathreen Devoid   On: 12/11/2017 16:28    Assessment and Plan:   Valerie Vazquez is a 62 y.o. y/o female  here today for elevated GGT. A few months back Alk phos was also elevated and hence GGT checked which was elevated suggesting a liver etiology. She has been on long term acetaminophen which she just stopped and recent LFT's almost normal   Plan  1. Stop acetaminophen long term use repeat  LFT, GGT in 3 months     Dr Valerie Bellows  MD,MRCP [facility]) Follow up in 3 months

## 2017-12-14 NOTE — Telephone Encounter (Signed)
Appeal denied also.

## 2017-12-14 NOTE — Progress Notes (Signed)
Patient states she was taking Equate 650mg  BID x 1 year.  Stopped last week. No longer having left side pain.

## 2017-12-17 ENCOUNTER — Other Ambulatory Visit: Payer: Self-pay | Admitting: Family Medicine

## 2017-12-17 DIAGNOSIS — K224 Dyskinesia of esophagus: Secondary | ICD-10-CM

## 2017-12-21 ENCOUNTER — Telehealth: Payer: Self-pay

## 2017-12-21 ENCOUNTER — Ambulatory Visit: Payer: Self-pay | Admitting: Family Medicine

## 2017-12-21 NOTE — Telephone Encounter (Signed)
prior authorization for rozerem was already approved dates are  08-30-17 to  08-31-18

## 2017-12-23 ENCOUNTER — Other Ambulatory Visit: Payer: Self-pay

## 2017-12-23 DIAGNOSIS — E063 Autoimmune thyroiditis: Secondary | ICD-10-CM

## 2017-12-23 NOTE — Telephone Encounter (Signed)
Last OV 10/23/17 last filled by Dr.Mclean 09/30/17 90 0rf

## 2017-12-24 MED ORDER — LEVOTHYROXINE SODIUM 25 MCG PO TABS: 25.0000 ug | ORAL_TABLET | Freq: Every day | ORAL | 0 refills | Status: DC

## 2017-12-24 NOTE — Telephone Encounter (Signed)
Sent to pharmacy.  She needs follow-up lab work for her thyroid.  Order has been placed.  Thanks.

## 2017-12-25 NOTE — Telephone Encounter (Signed)
Tried calling, no voicemail. [place] for pec to speak to patient and schedule non fasting lab appointment for tsh recheck

## 2017-12-26 IMAGING — DX DG ABDOMEN 2V
2 series · 2 of 2 positions shown · non-contrast
Comparison: MRI 10/19/2015.  KUB 06/18/2015.

CLINICAL DATA: Abdominal distention.

EXAM:
ABDOMEN - 2 VIEW

[abdomen standing ap (1 of 2)]
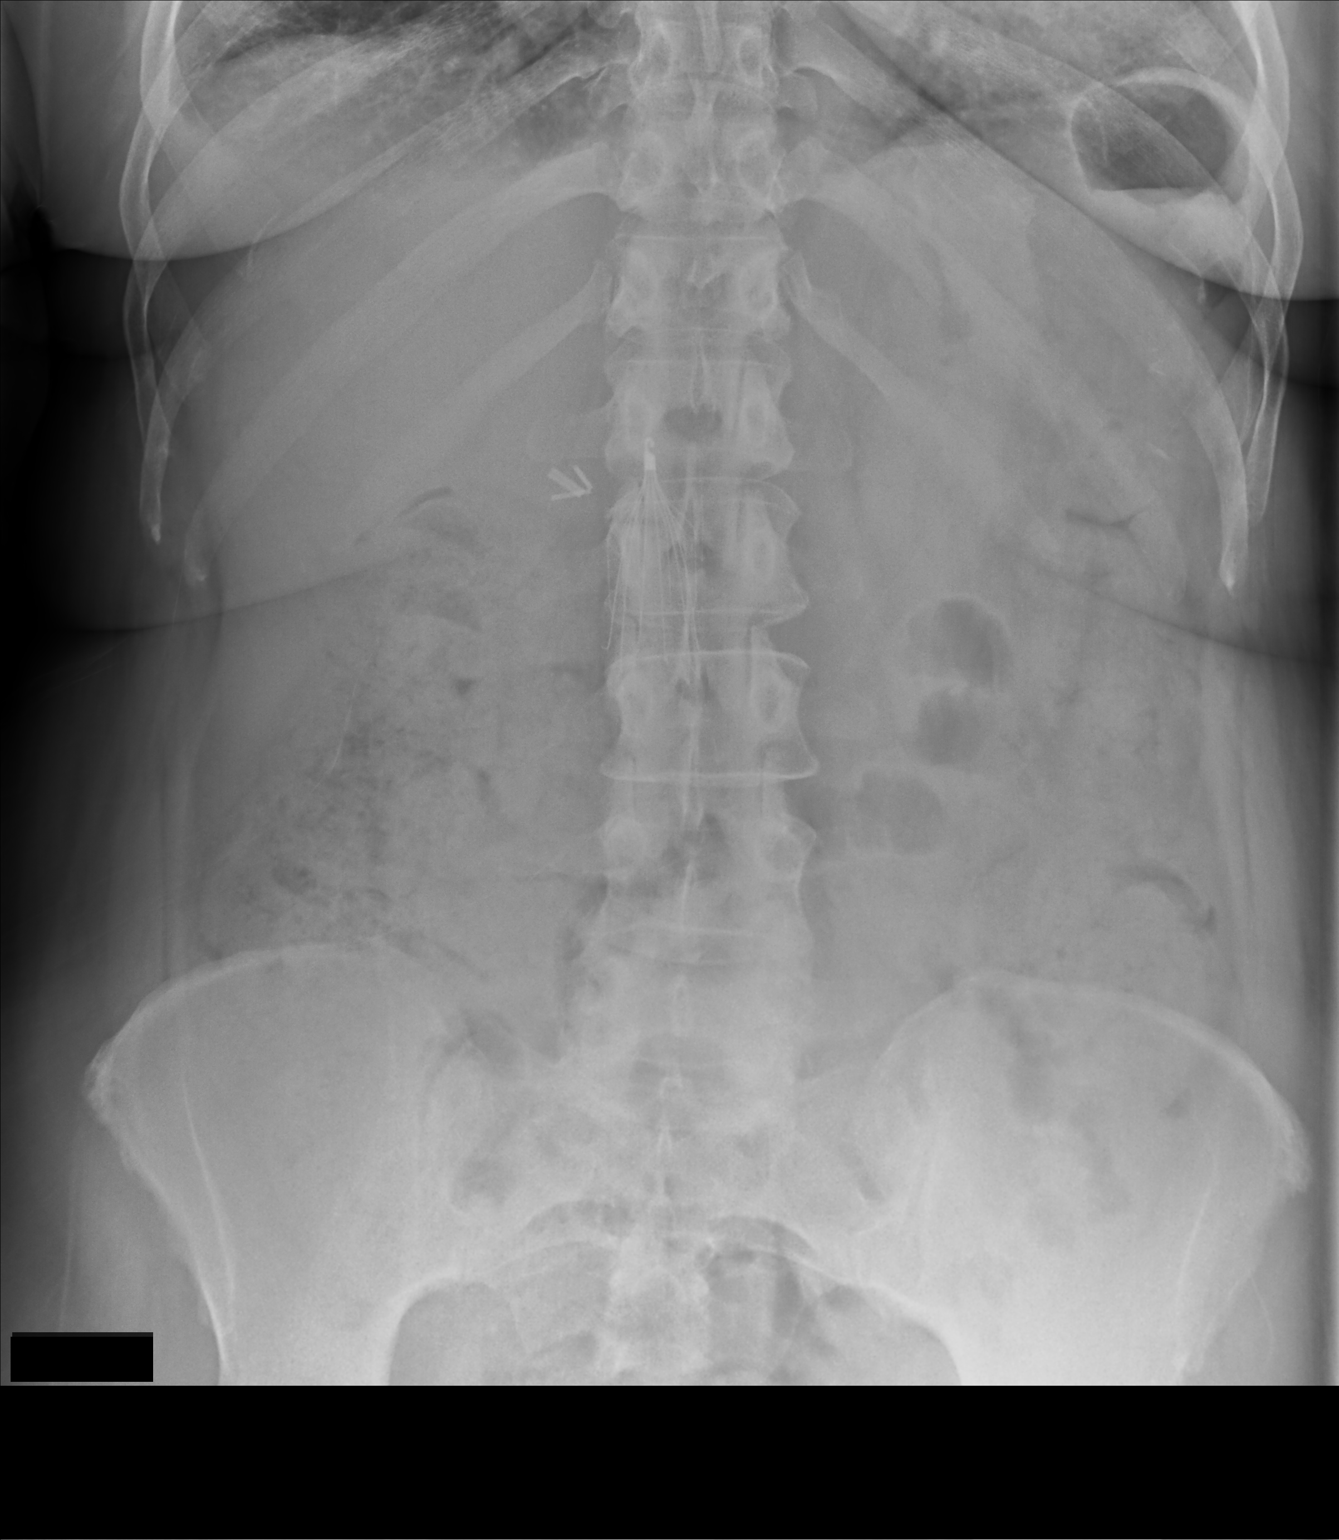

[abdomen standing ap (2 of 2)]
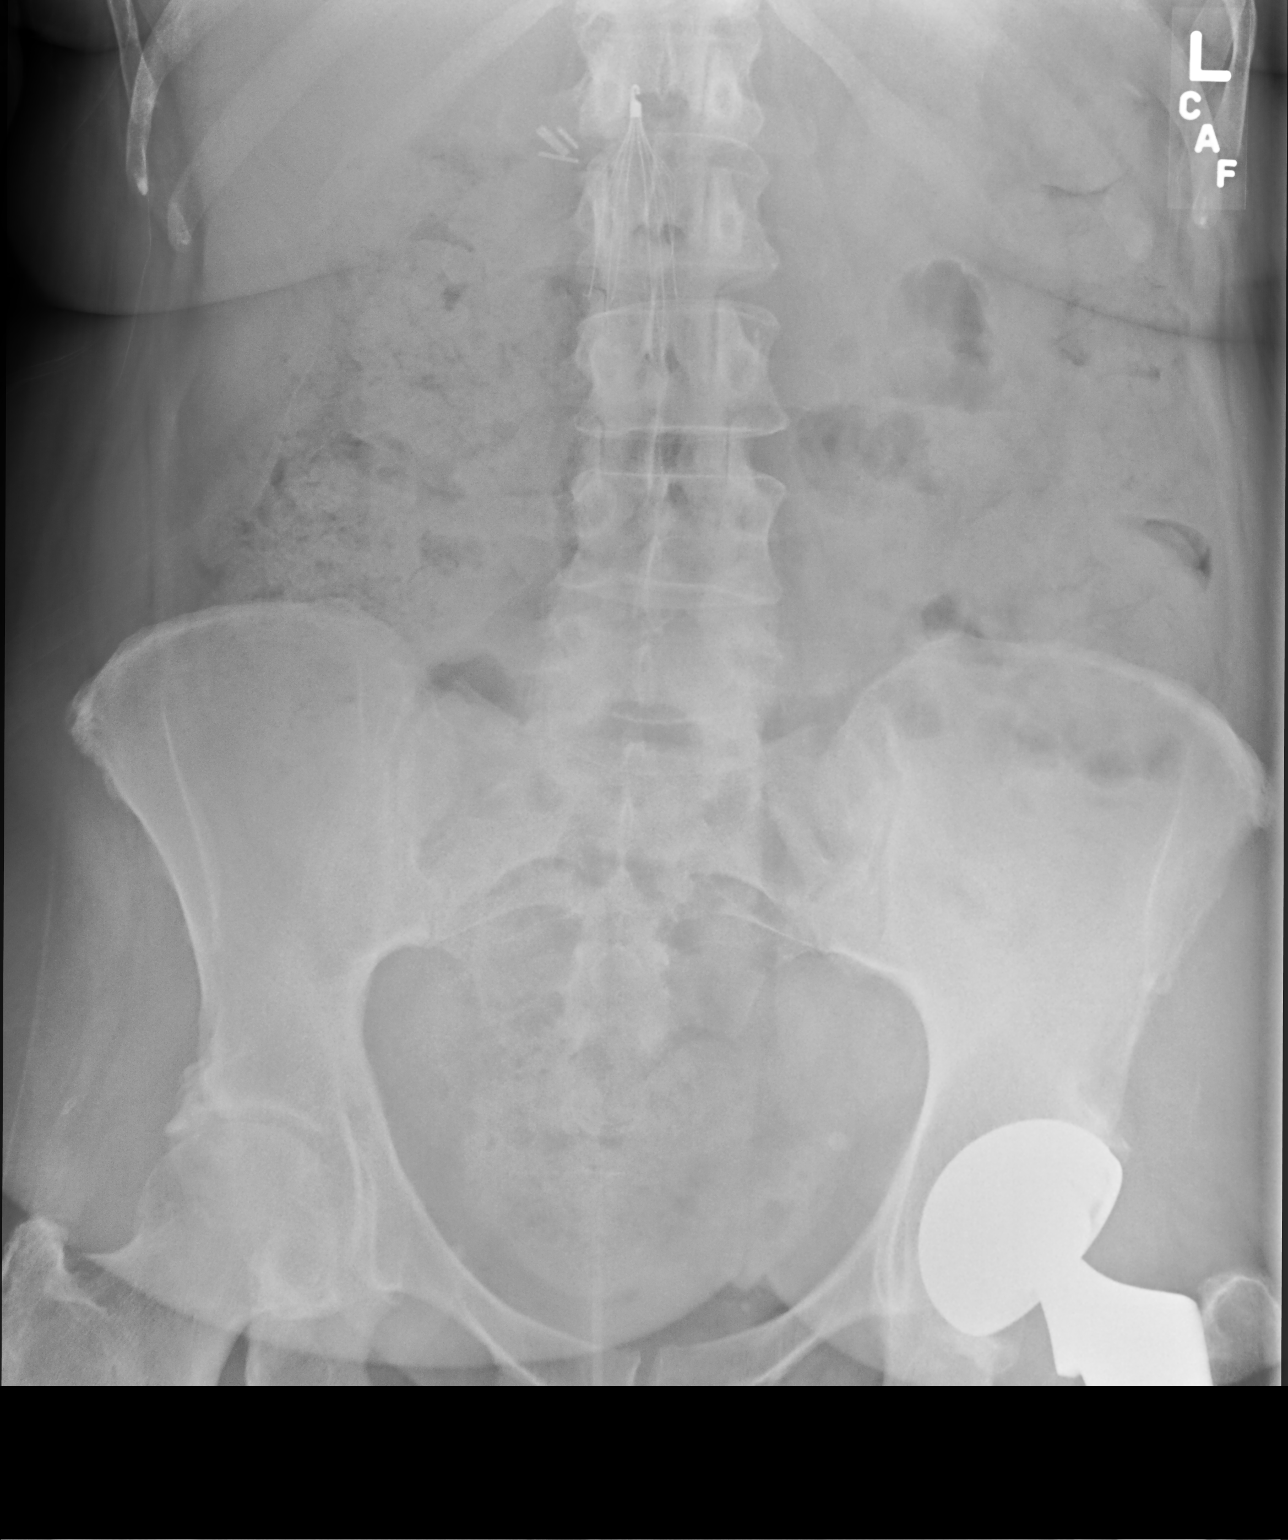

[2 of 2 positions shown; findings below may reference images not displayed]

FINDINGS: Surgical clips right upper quadrant. IVC filter noted in stable
position. Stool noted throughout the colon. No bowel distention. No
acute bony abnormality. Left hip replacement.
IMPRESSION: 1.  Prior cholecystectomy.  IVC filter noted stable position.

2. Large amount stool in colon suggesting constipation. No bowel
distention.

## 2018-01-02 IMAGING — MG MM DIGITAL DIAGNOSTIC BILAT W/ TOMO W/ CAD
9 of 12 series · 9 of 28 positions shown · non-contrast
Comparison: Previous exam(s).

CLINICAL DATA: Left breast pain.

EXAM:
2D DIGITAL DIAGNOSTIC BILATERAL MAMMOGRAM WITH CAD AND ADJUNCT TOMO
ULTRASOUND LEFT BREAST

[R CC synth-2D]
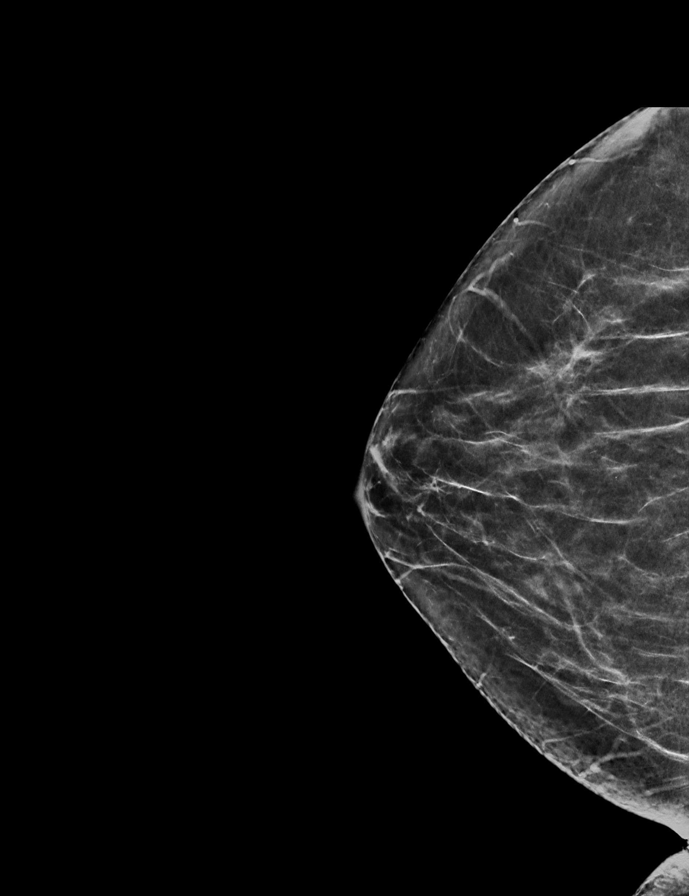

[R MLO]
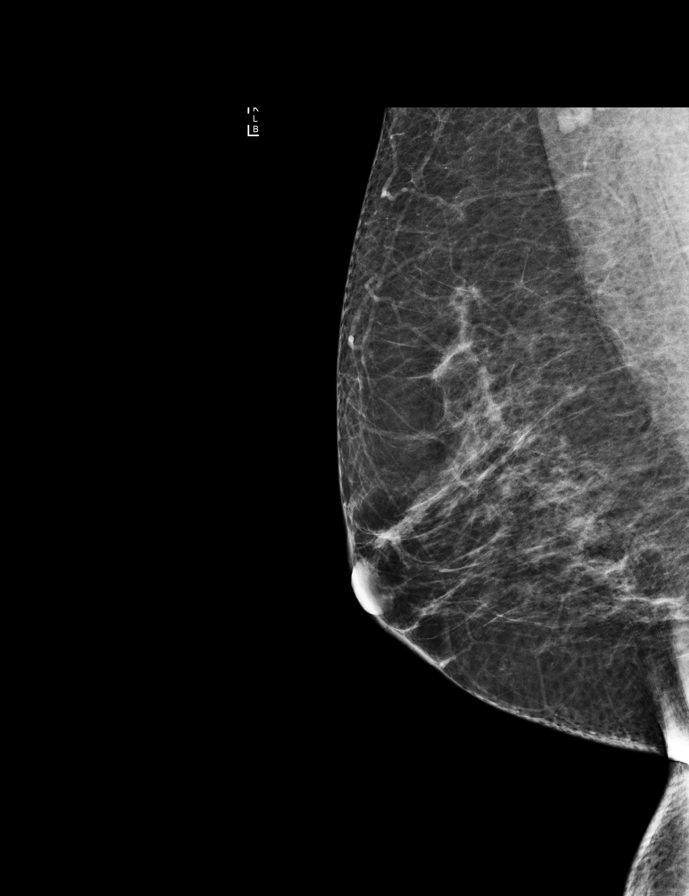

[R MLO synth-2D]
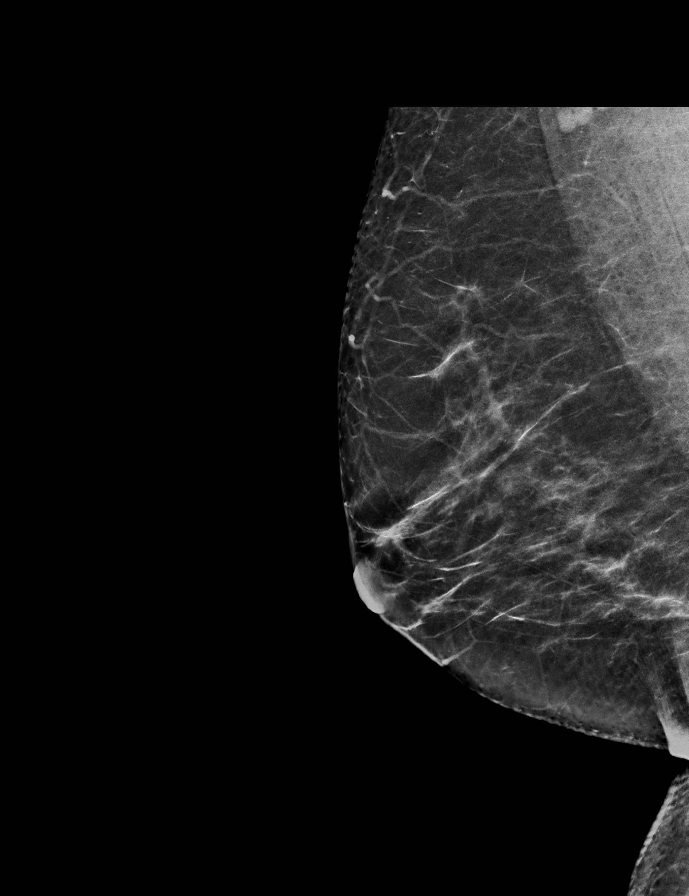

[L MLO]
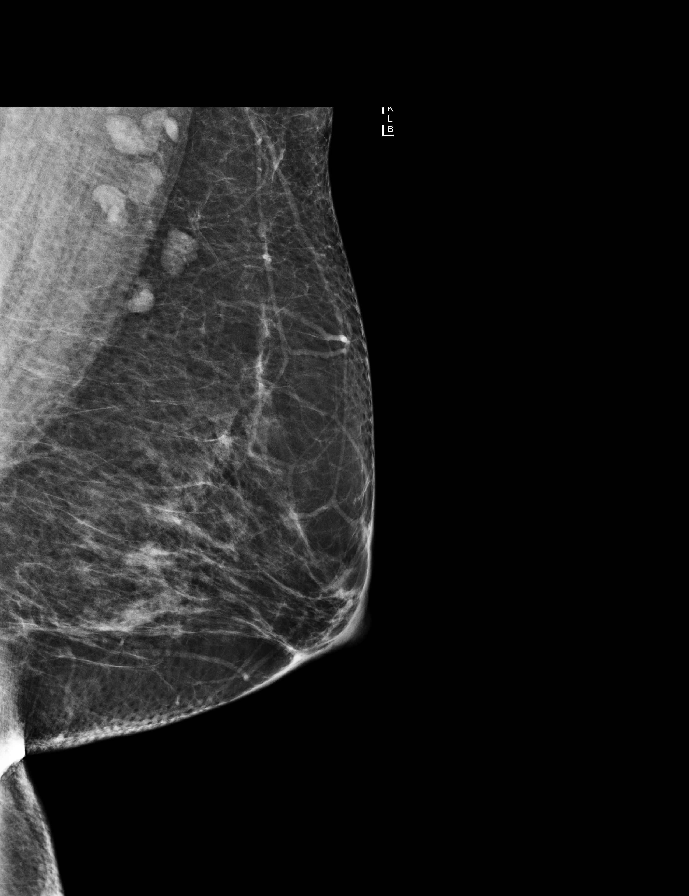

[L CC synth-2D]
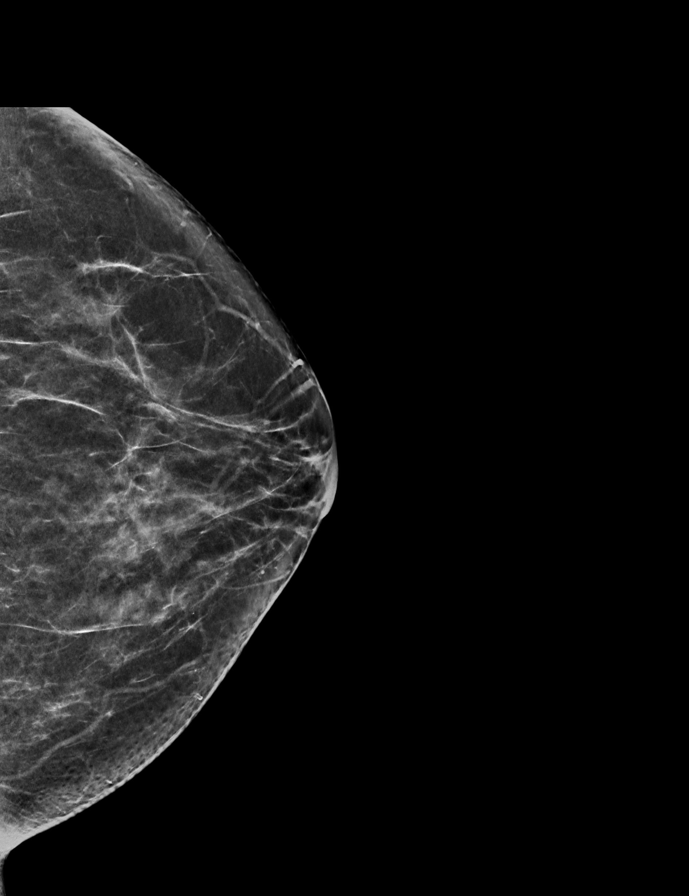

[L MLO synth-2D]
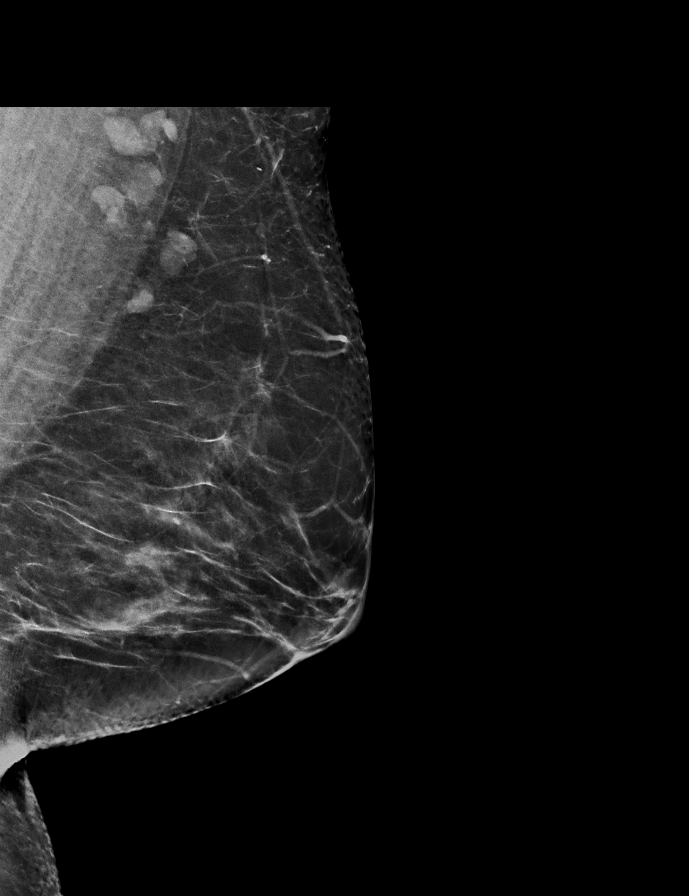

[R CC]
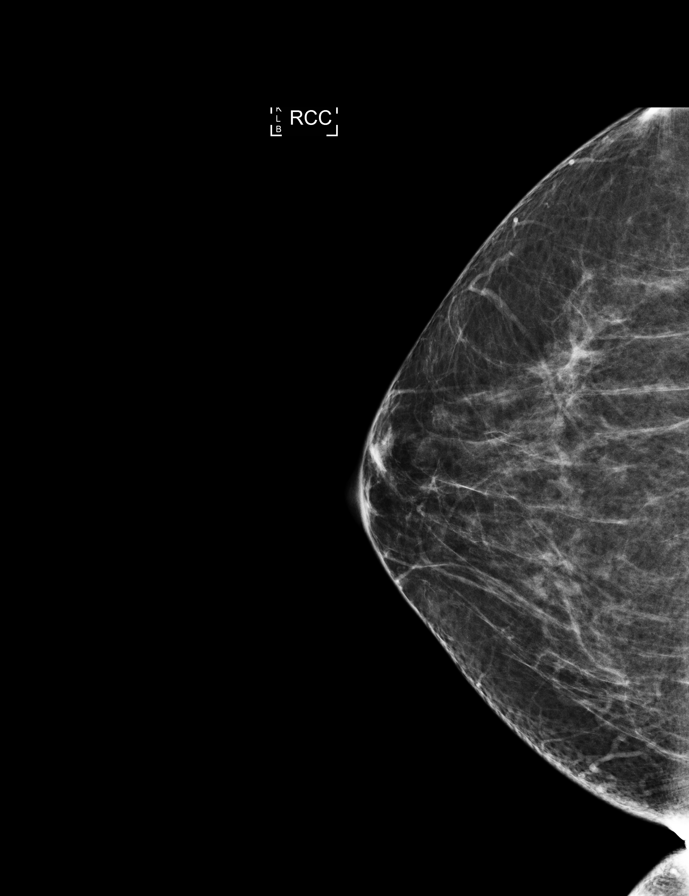

[L CC]
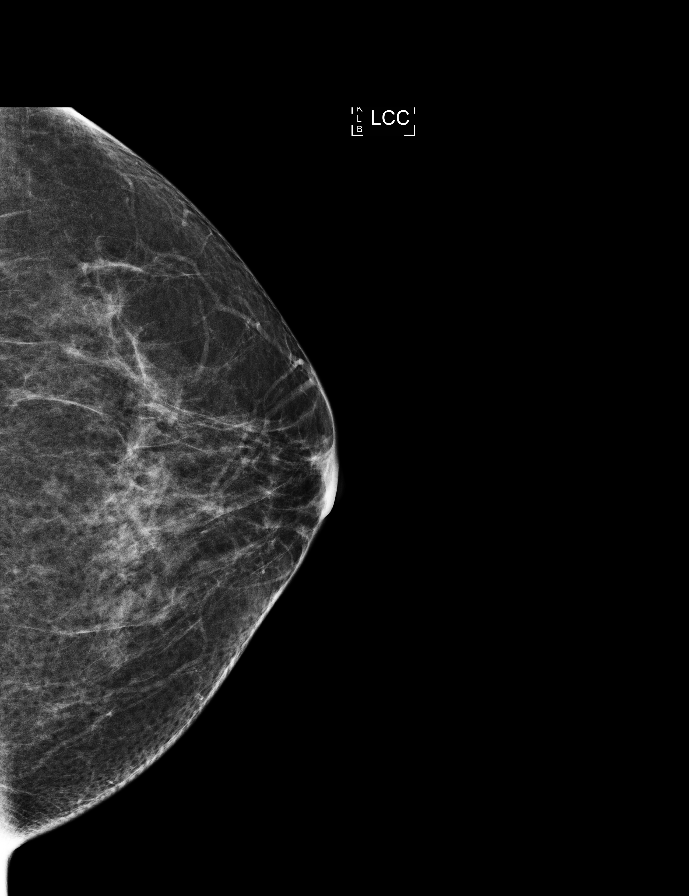

[L CC tomo · tomo slice 31/60.0]
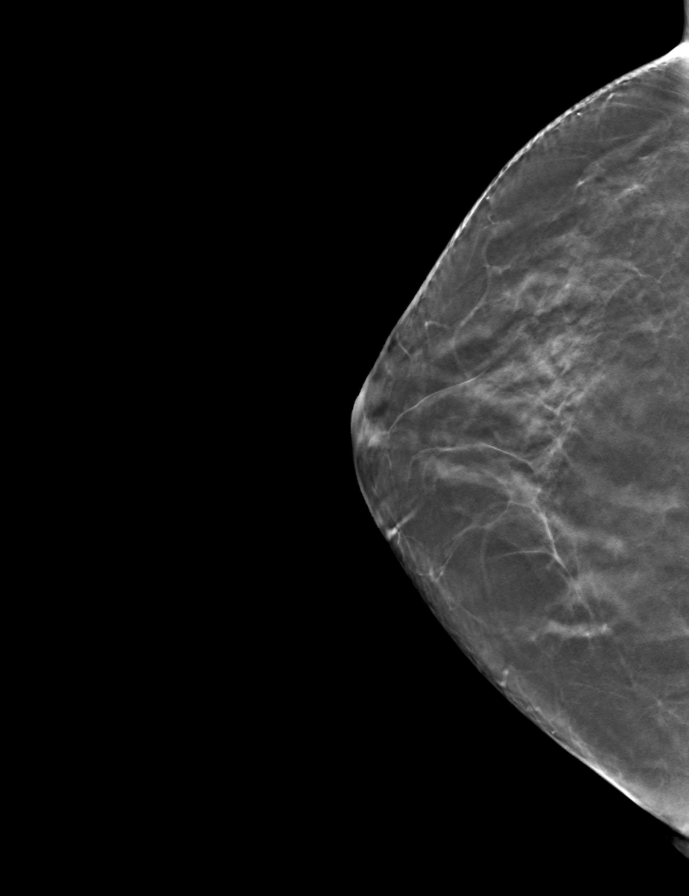

[9 of 28 positions shown; findings below may reference images not displayed]

ACR Breast Density Category b: There are scattered areas of
fibroglandular density.
FINDINGS: Cc and MLO views of bilateral breasts are submitted. No suspicious
abnormalities identified bilaterally.

Mammographic images were processed with CAD.

Targeted ultrasound is performed, showing no focal abnormal discrete
cystic or solid lesion in the upper-outer quadrant left breast and
in the left axilla area of pain.
IMPRESSION: Negative.

RECOMMENDATION:
Routine screening mammogram in 1 year.

I have discussed the findings and recommendations with the patient.
Results were also provided in writing at the conclusion of the
visit. If applicable, a reminder letter will be sent to the patient
regarding the next appointment.

BI-RADS CATEGORY  1: Negative.

## 2018-01-04 ENCOUNTER — Other Ambulatory Visit: Payer: Self-pay | Admitting: Family Medicine

## 2018-01-04 DIAGNOSIS — Z760 Encounter for issue of repeat prescription: Secondary | ICD-10-CM

## 2018-01-06 ENCOUNTER — Other Ambulatory Visit: Payer: Self-pay

## 2018-01-06 DIAGNOSIS — E119 Type 2 diabetes mellitus without complications: Secondary | ICD-10-CM

## 2018-01-06 IMAGING — CR DG WRIST COMPLETE 3+V*L*
4 series · 4 of 4 positions shown · non-contrast
Comparison: None.

CLINICAL DATA: Recent fall with pain and swelling, initial
encounter

EXAM:
LEFT WRIST - COMPLETE 3+ VIEW

[wrist pa]
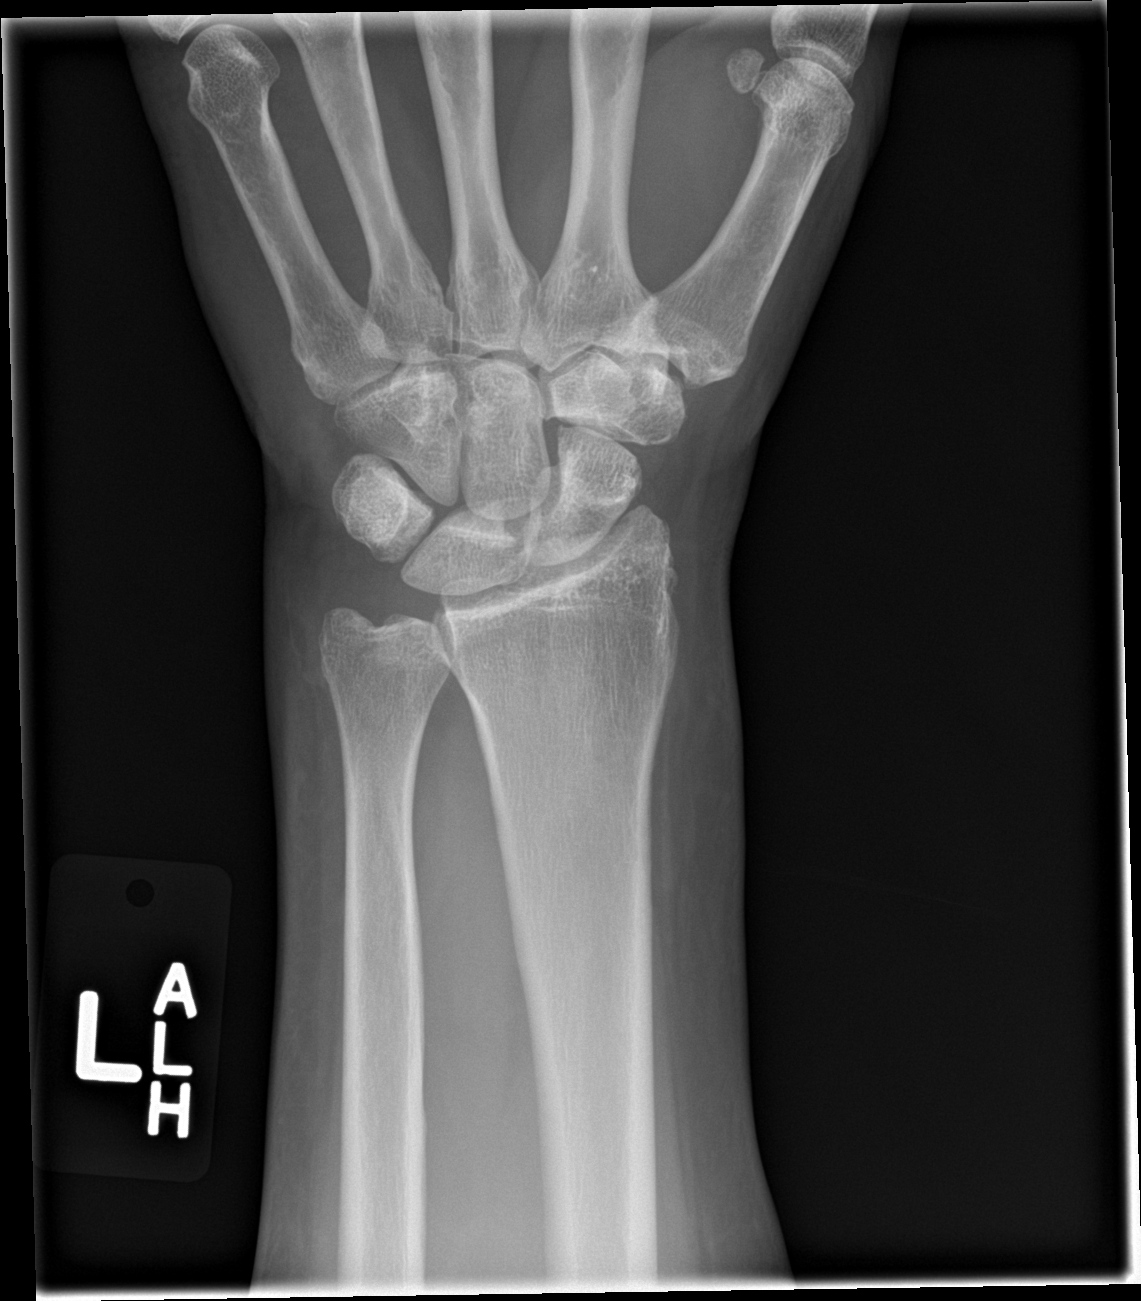

[wrist obl]
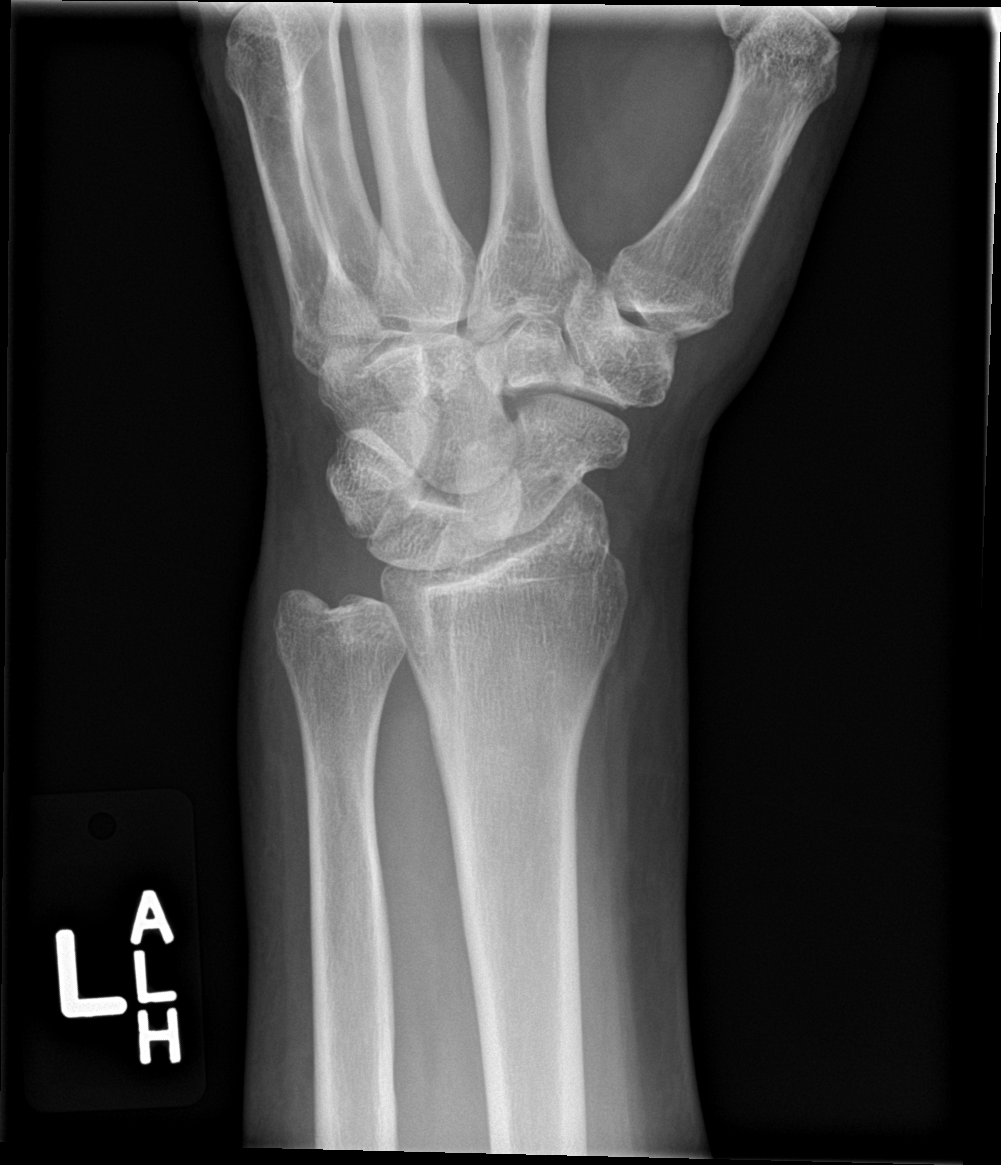

[wrist lat]
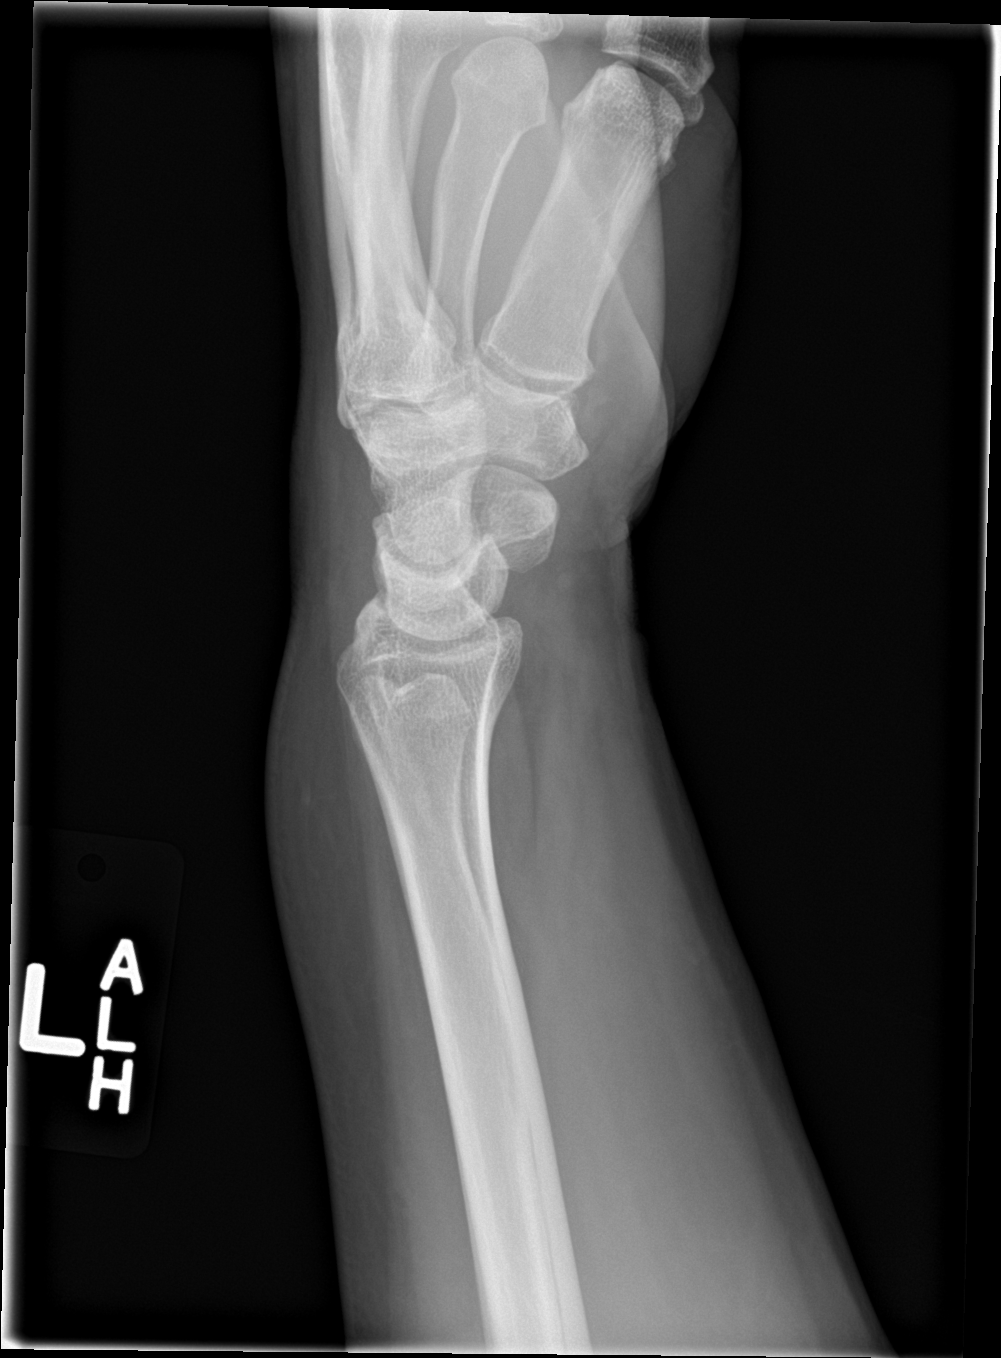

[wrist navicular]
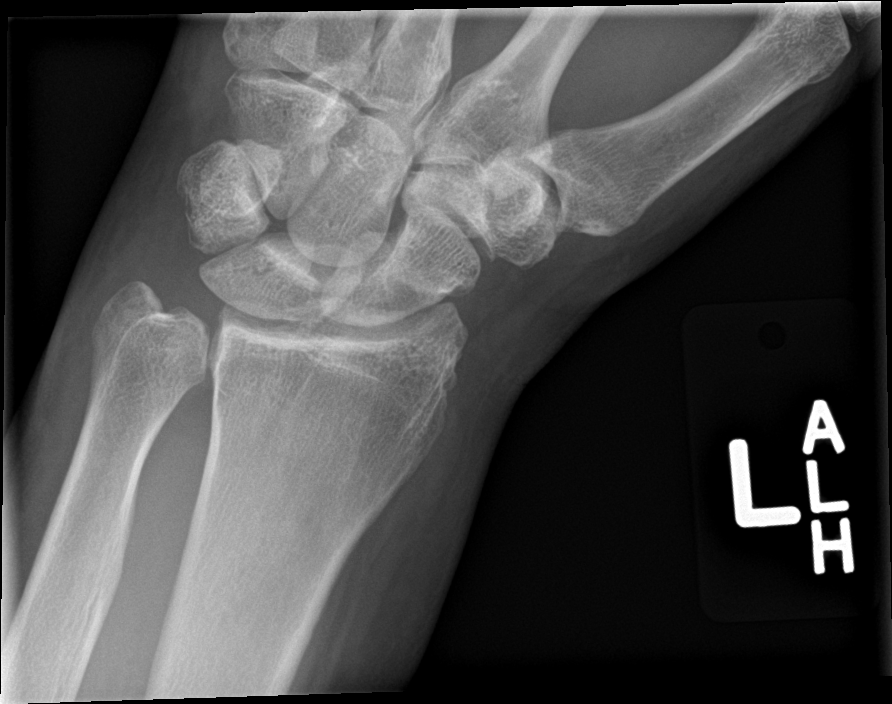

[4 of 4 positions shown; findings below may reference images not displayed]

FINDINGS: Mild degenerative changes in the radiocarpal joint are seen. No
acute fracture or dislocation is noted. Generalized soft tissue
swelling is seen. No other focal abnormality is noted.
IMPRESSION: Soft tissue swelling without acute bony abnormality.

## 2018-01-06 IMAGING — CR DG ELBOW COMPLETE 3+V*L*
4 series · 4 of 4 positions shown · non-contrast
Comparison: None.

CLINICAL DATA: Fall with left elbow injury today.

EXAM:
LEFT ELBOW - COMPLETE 3+ VIEW

[elbow ap]
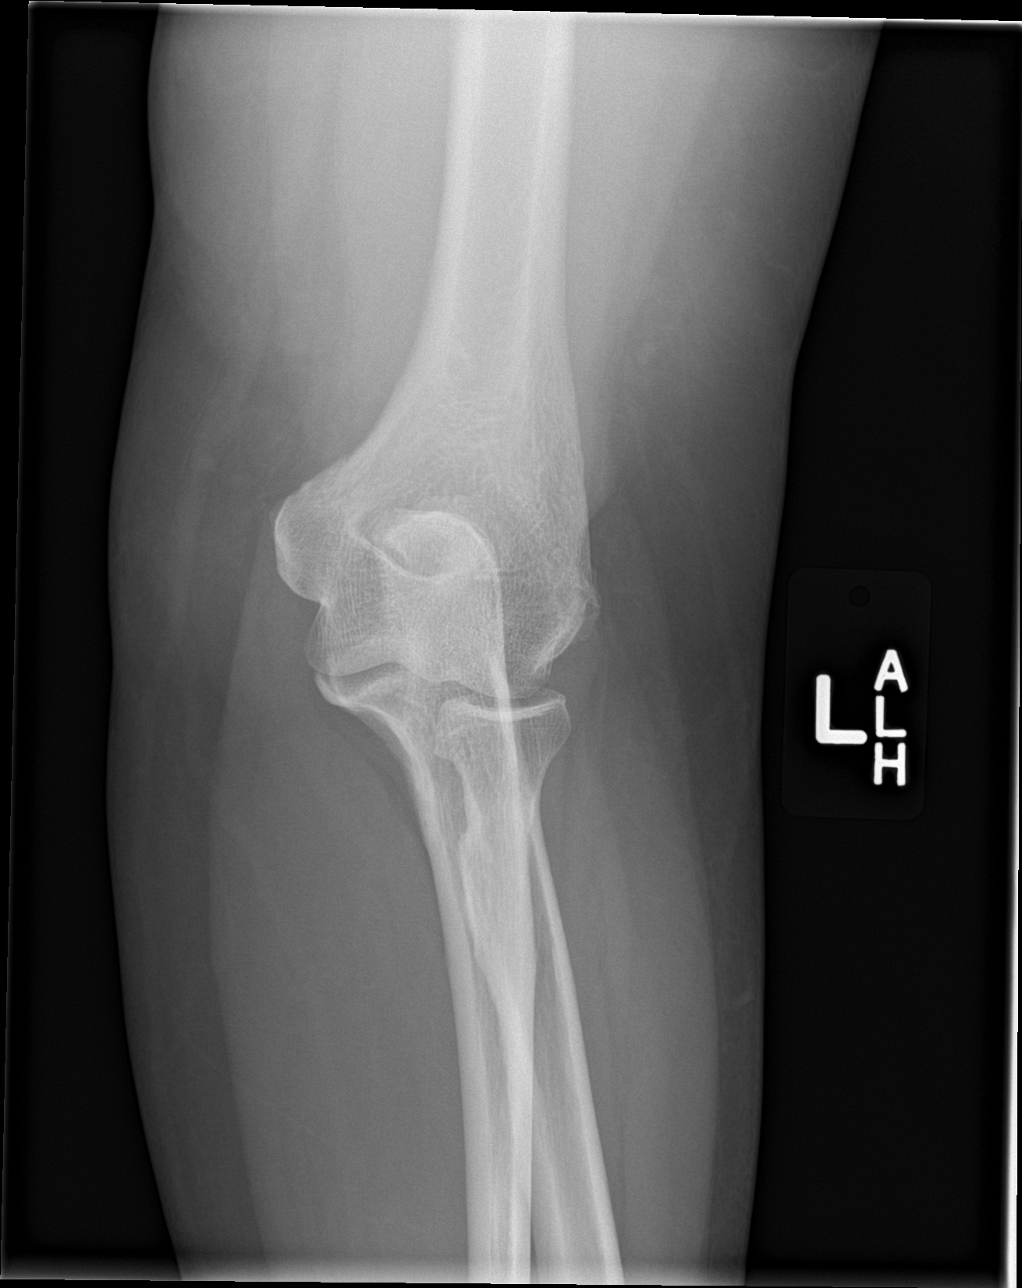

[elbow obl (1 of 2)]
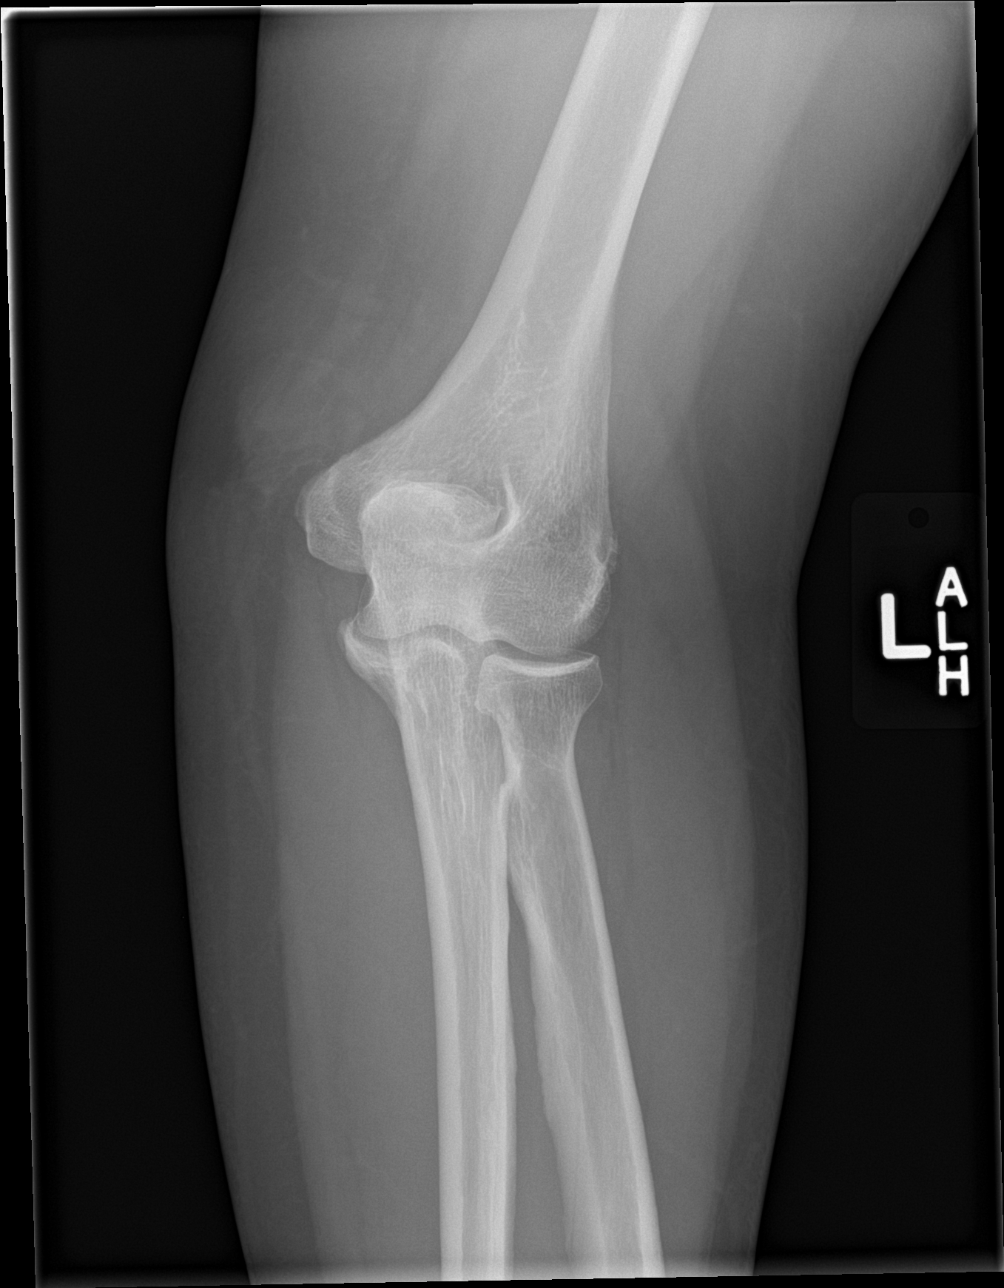

[elbow obl (2 of 2)]
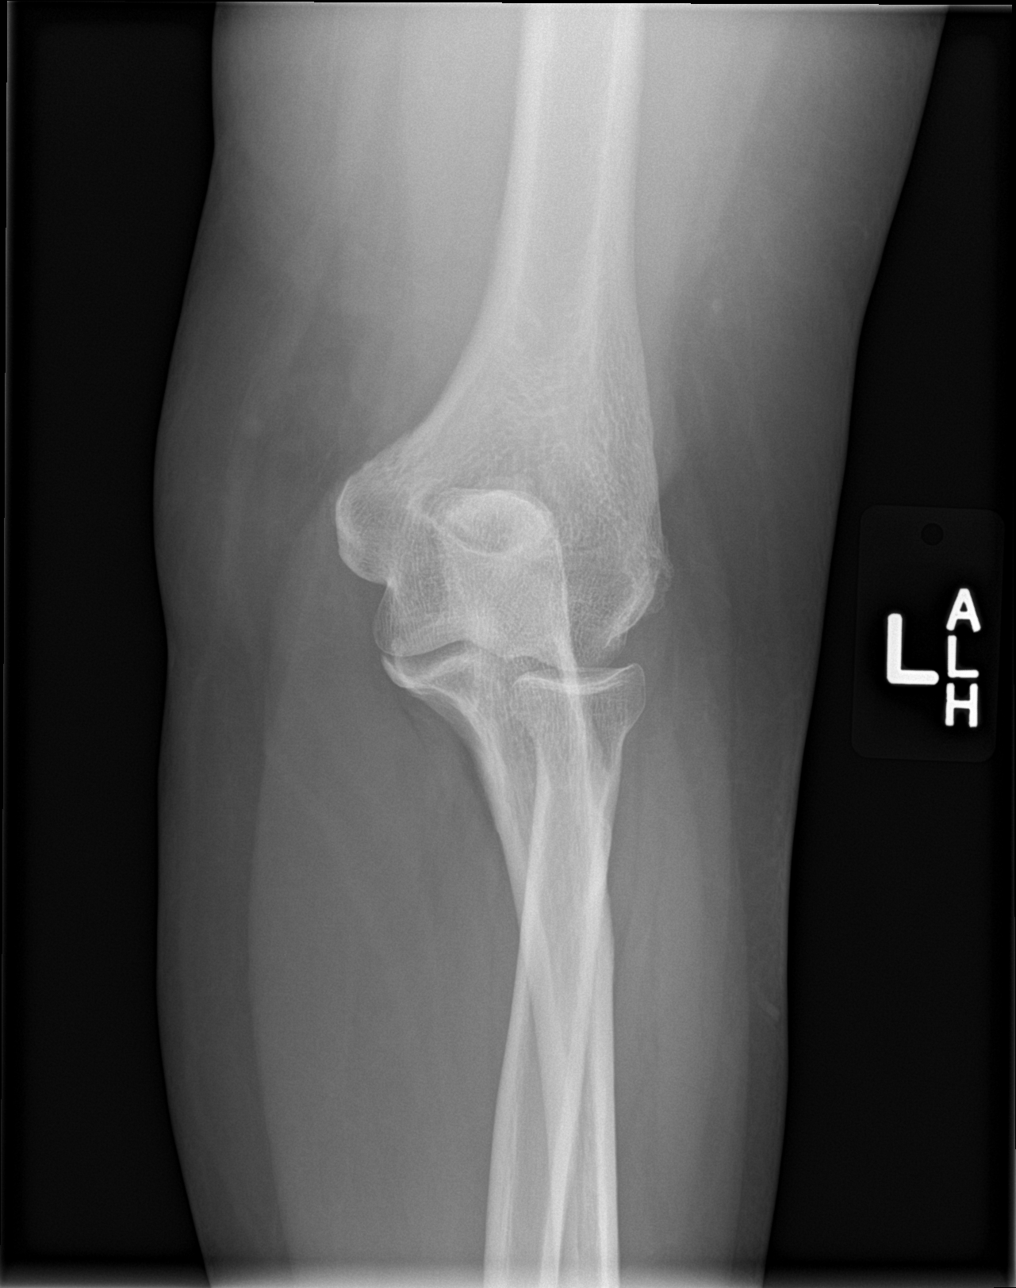

[elbow lat]
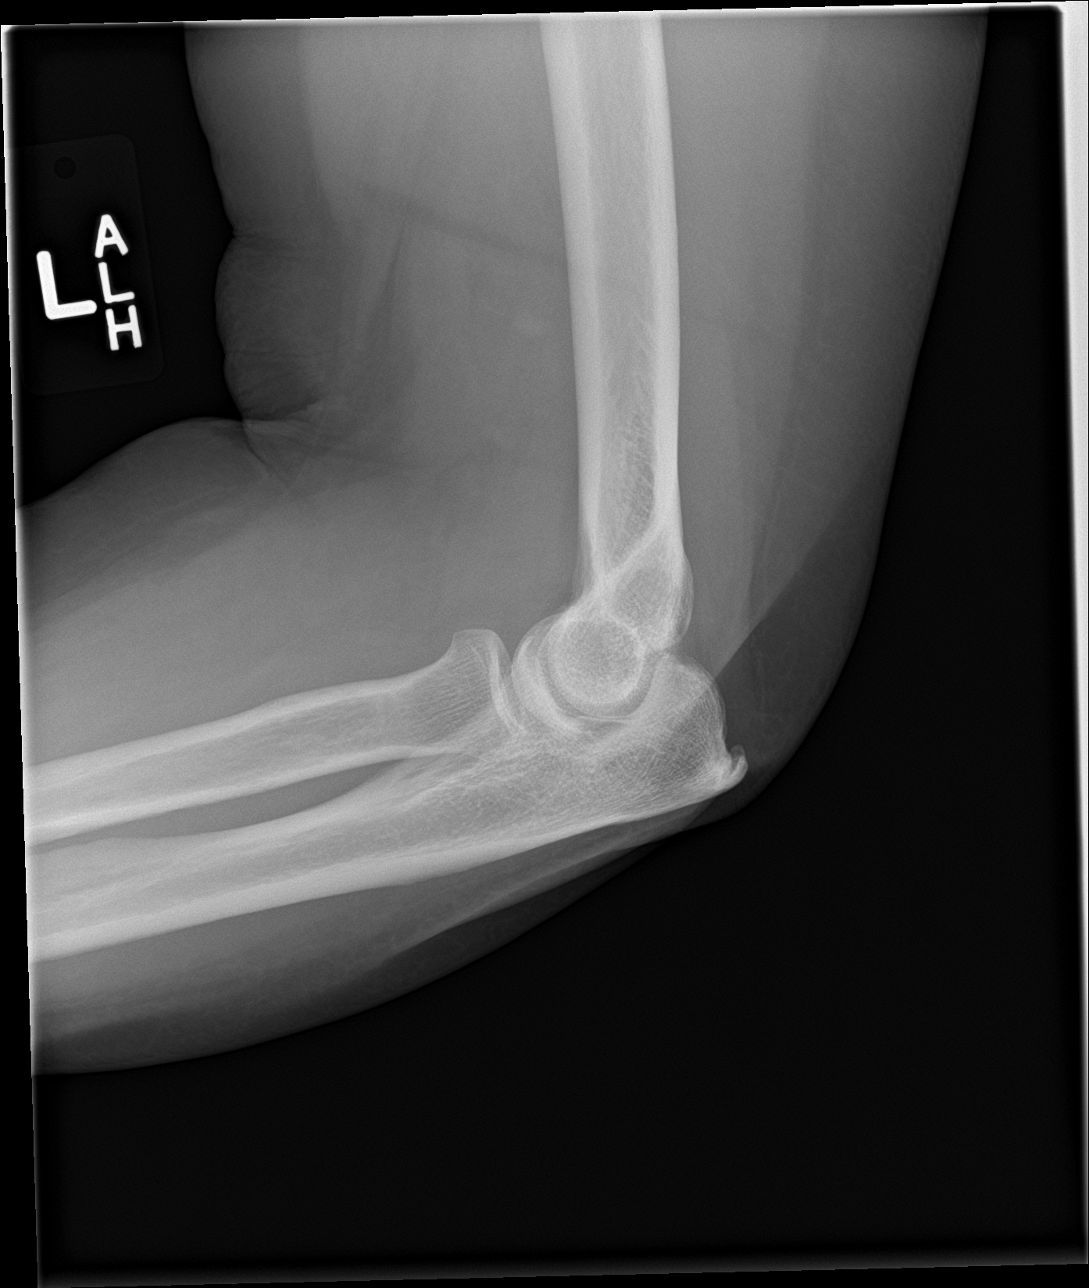

[4 of 4 positions shown; findings below may reference images not displayed]

FINDINGS: There are mild degenerative changes of the elbow joint. No acute
fracture or dislocation. No significant joint effusion.
IMPRESSION: No acute findings.

## 2018-01-06 MED ORDER — SITAGLIPTIN PHOSPHATE 50 MG PO TABS: 50.0000 mg | ORAL_TABLET | Freq: Every day | ORAL | 1 refills | Status: DC

## 2018-01-08 IMAGING — DX DG WRIST COMPLETE 3+V*L*
4 series · 4 of 4 positions shown · non-contrast
Comparison: 02/29/2016

CLINICAL DATA: Numbness

EXAM:
LEFT WRIST - COMPLETE 3+ VIEW

[wrist pa]
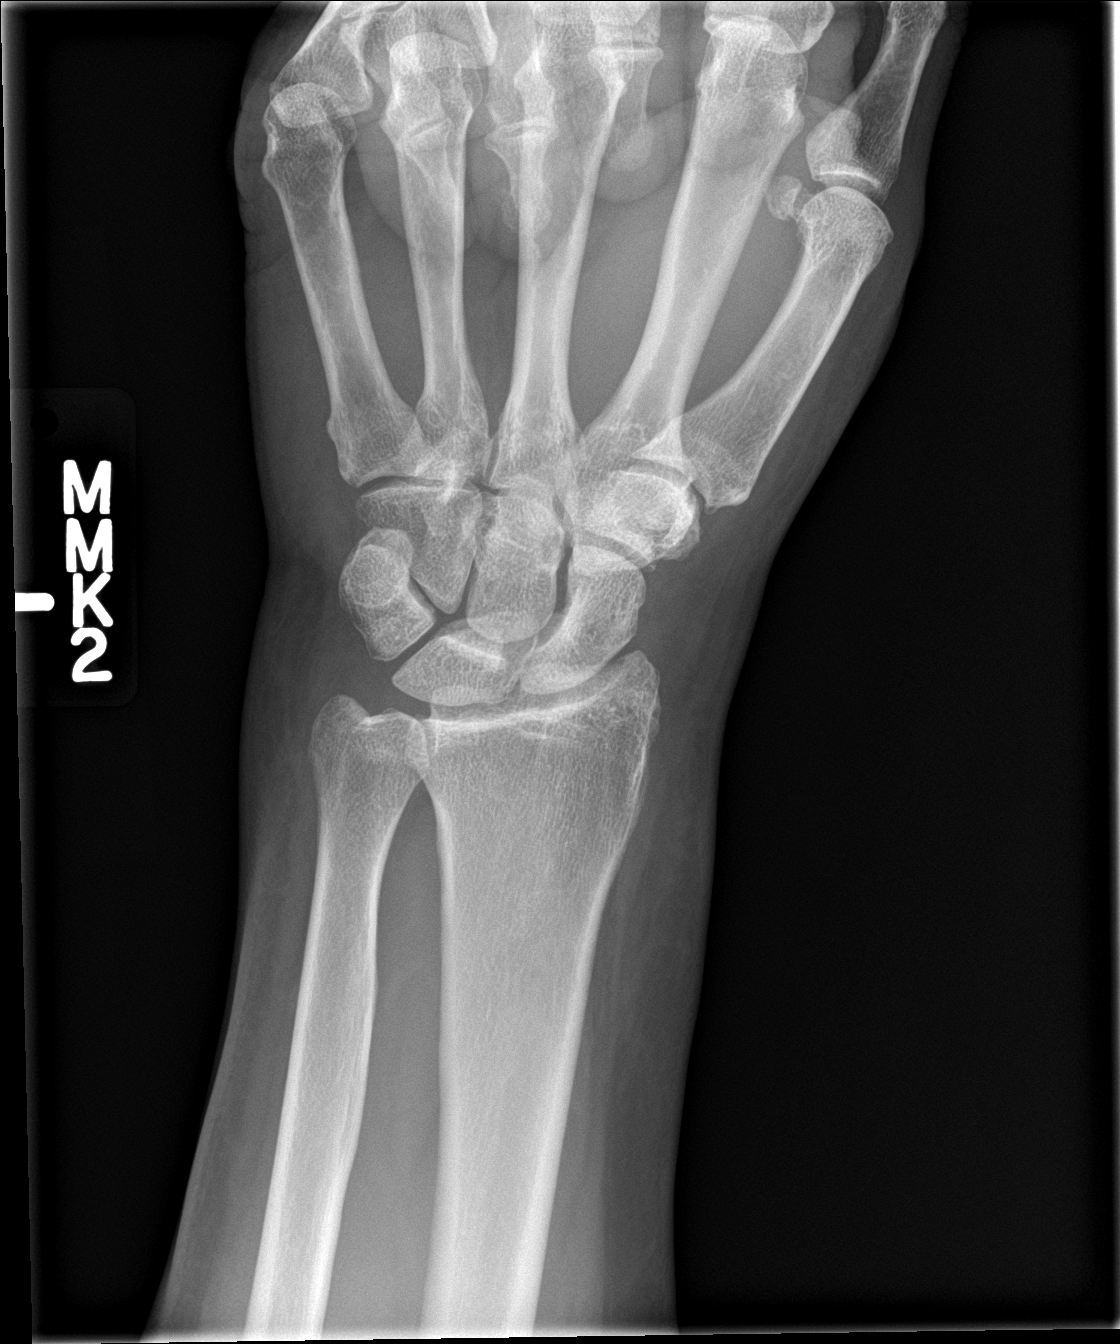

[wrist obl]
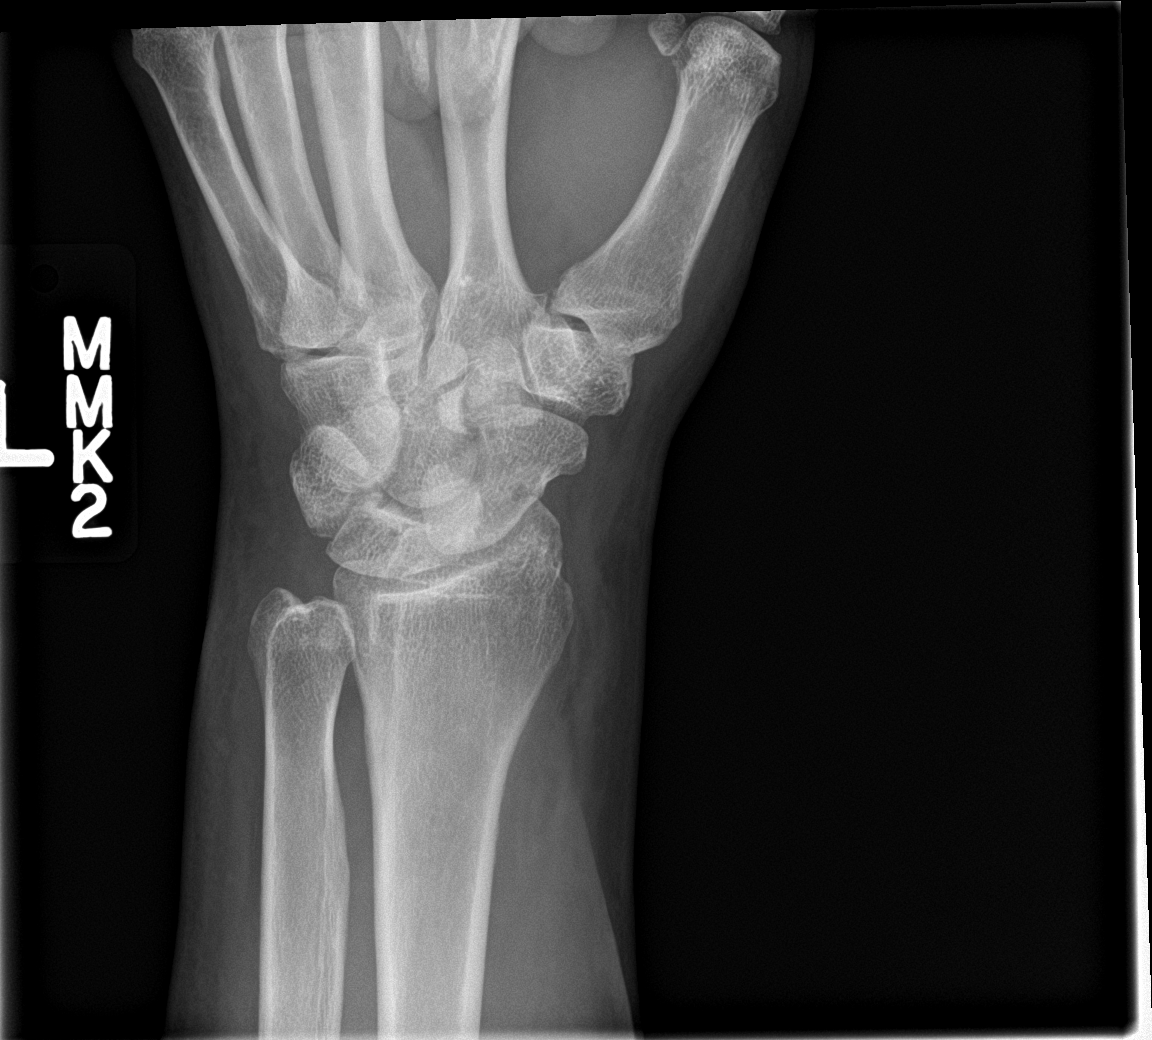

[wrist lat]
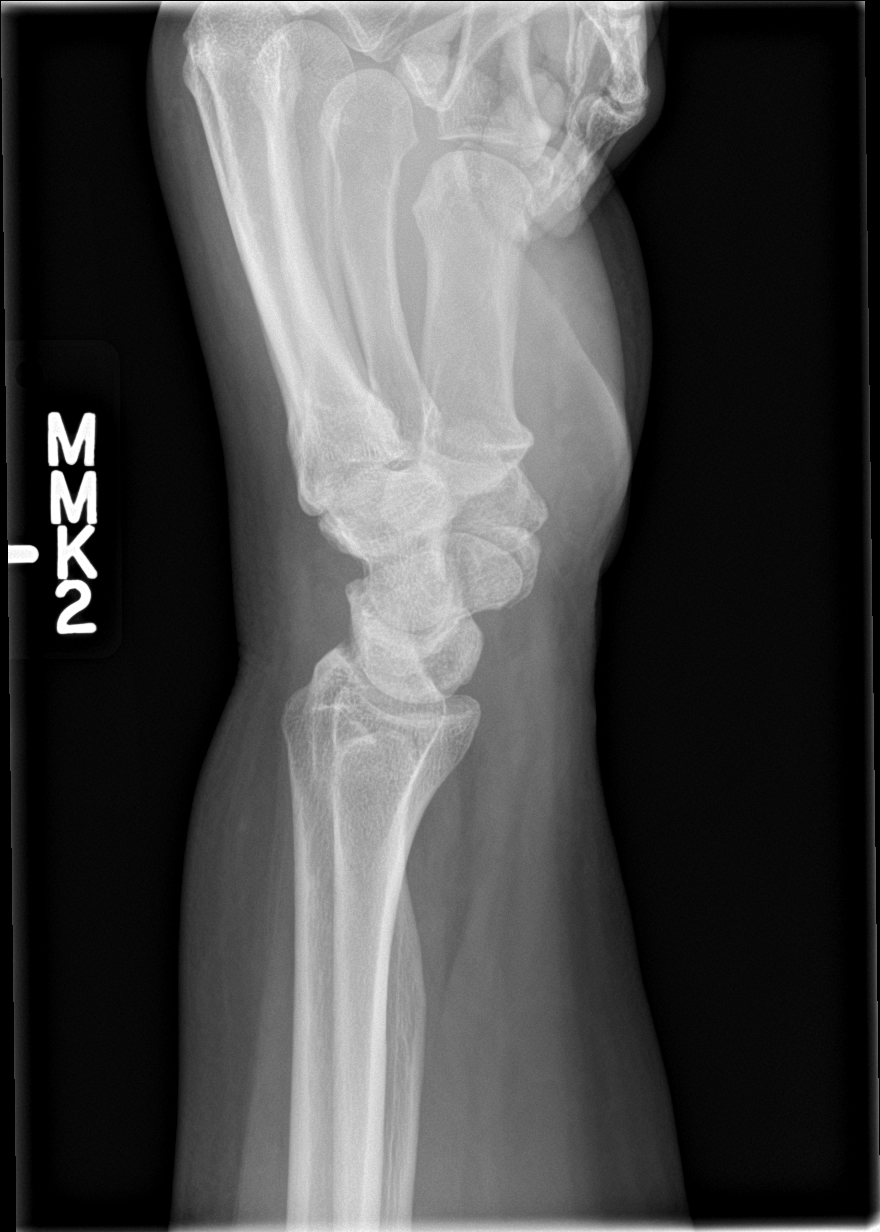

[wrist navicular]
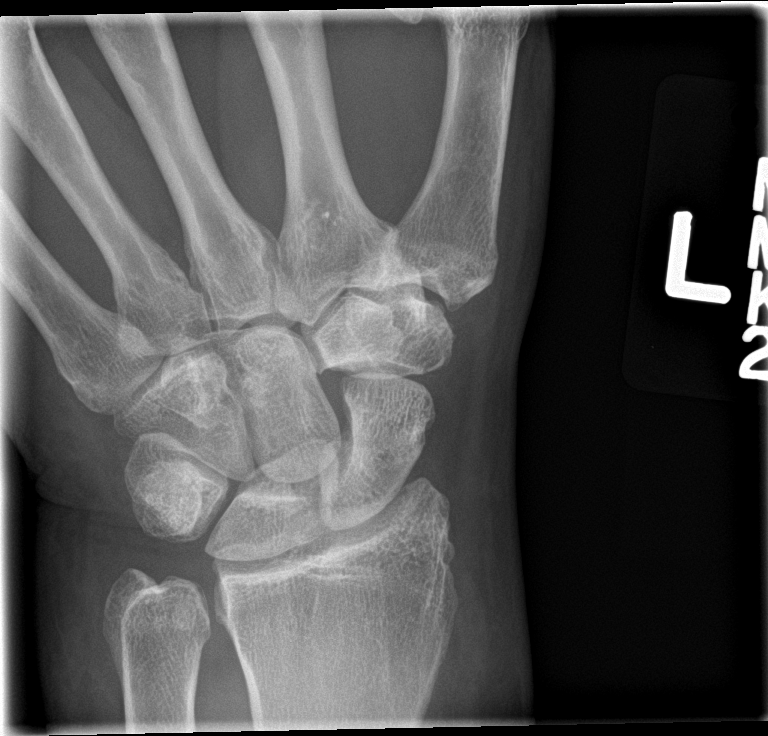

[4 of 4 positions shown; findings below may reference images not displayed]

FINDINGS: There is no evidence of fracture or dislocation. There is no
evidence of arthropathy or other focal bone abnormality. Soft
tissues are unremarkable.
IMPRESSION: Negative.

## 2018-01-08 IMAGING — DX DG HUMERUS 2V *L*
3 series · 4 of 4 positions shown · non-contrast
Comparison: None.

CLINICAL DATA: Numbness.

EXAM:
LEFT HUMERUS - 2+ VIEW

[Series 1: humerus ap · 0.14mm/px · 2 of 2 slices shown]
[im 1/2]
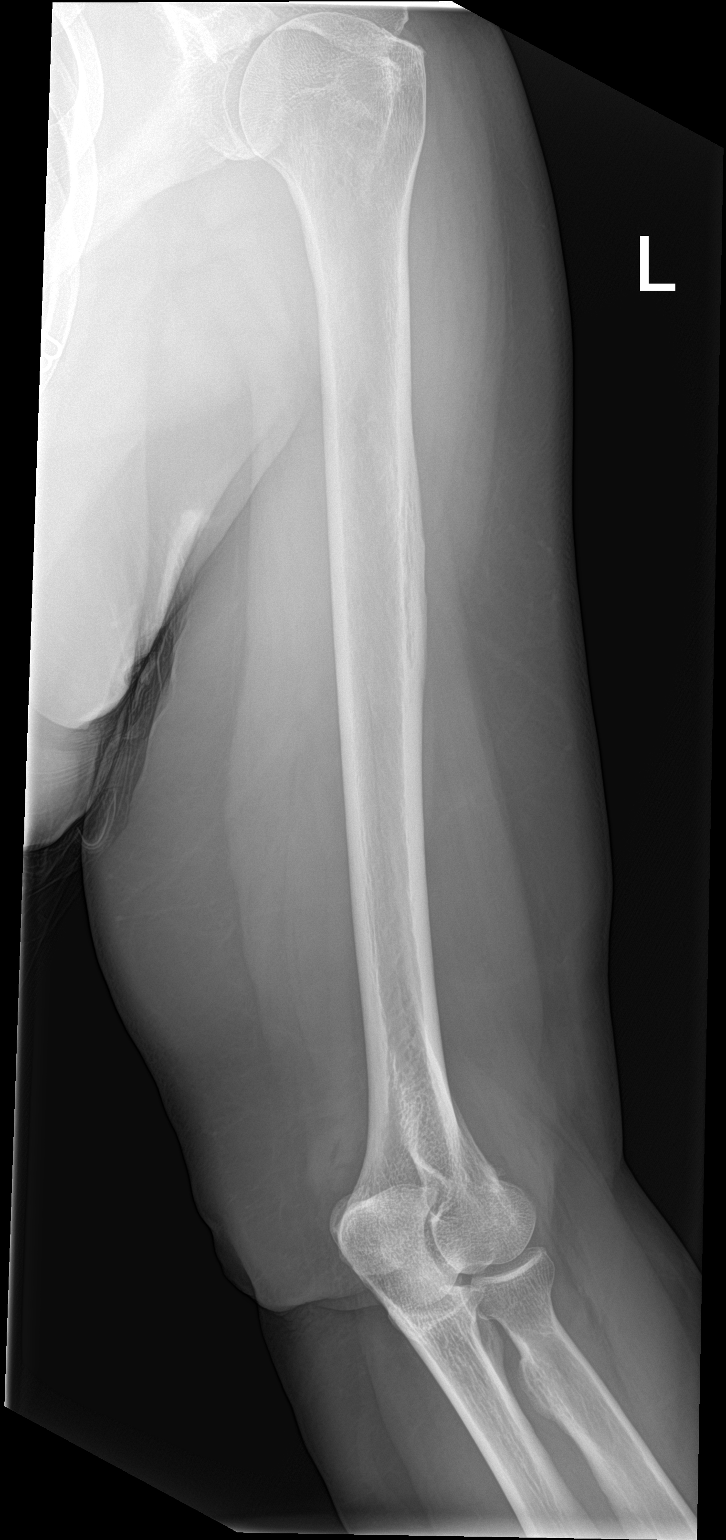
[im 2/2]
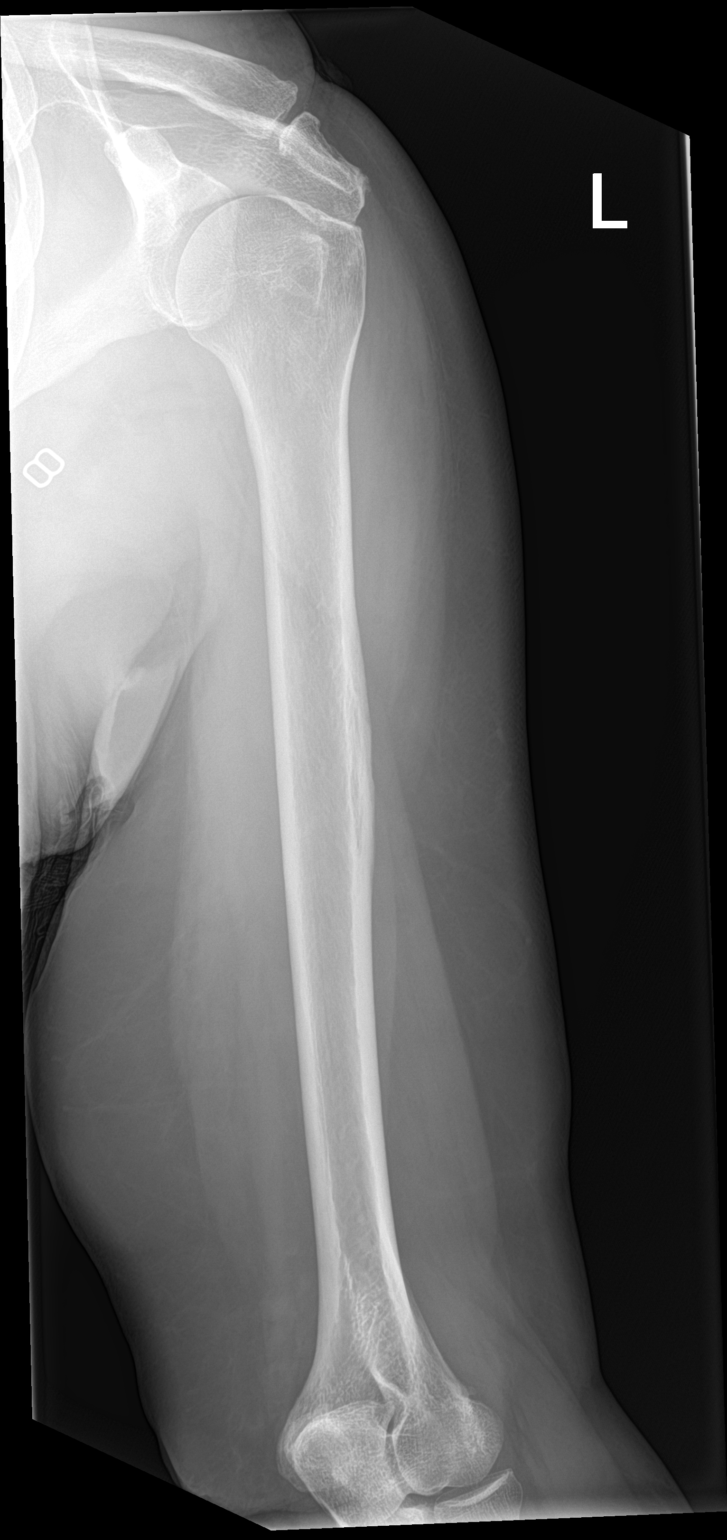

[humerus lat (1 of 2)]
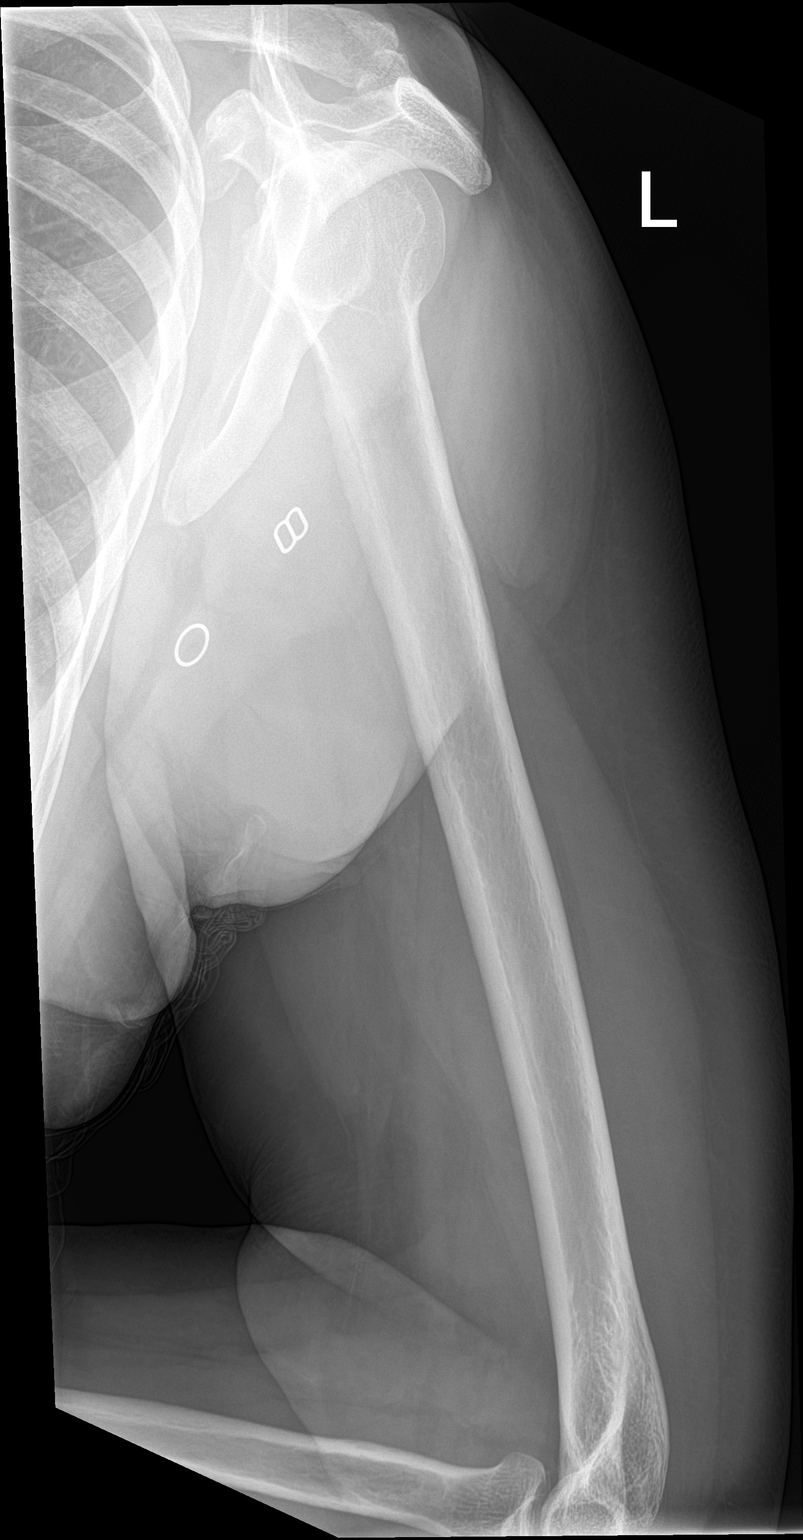

[humerus lat (2 of 2)]
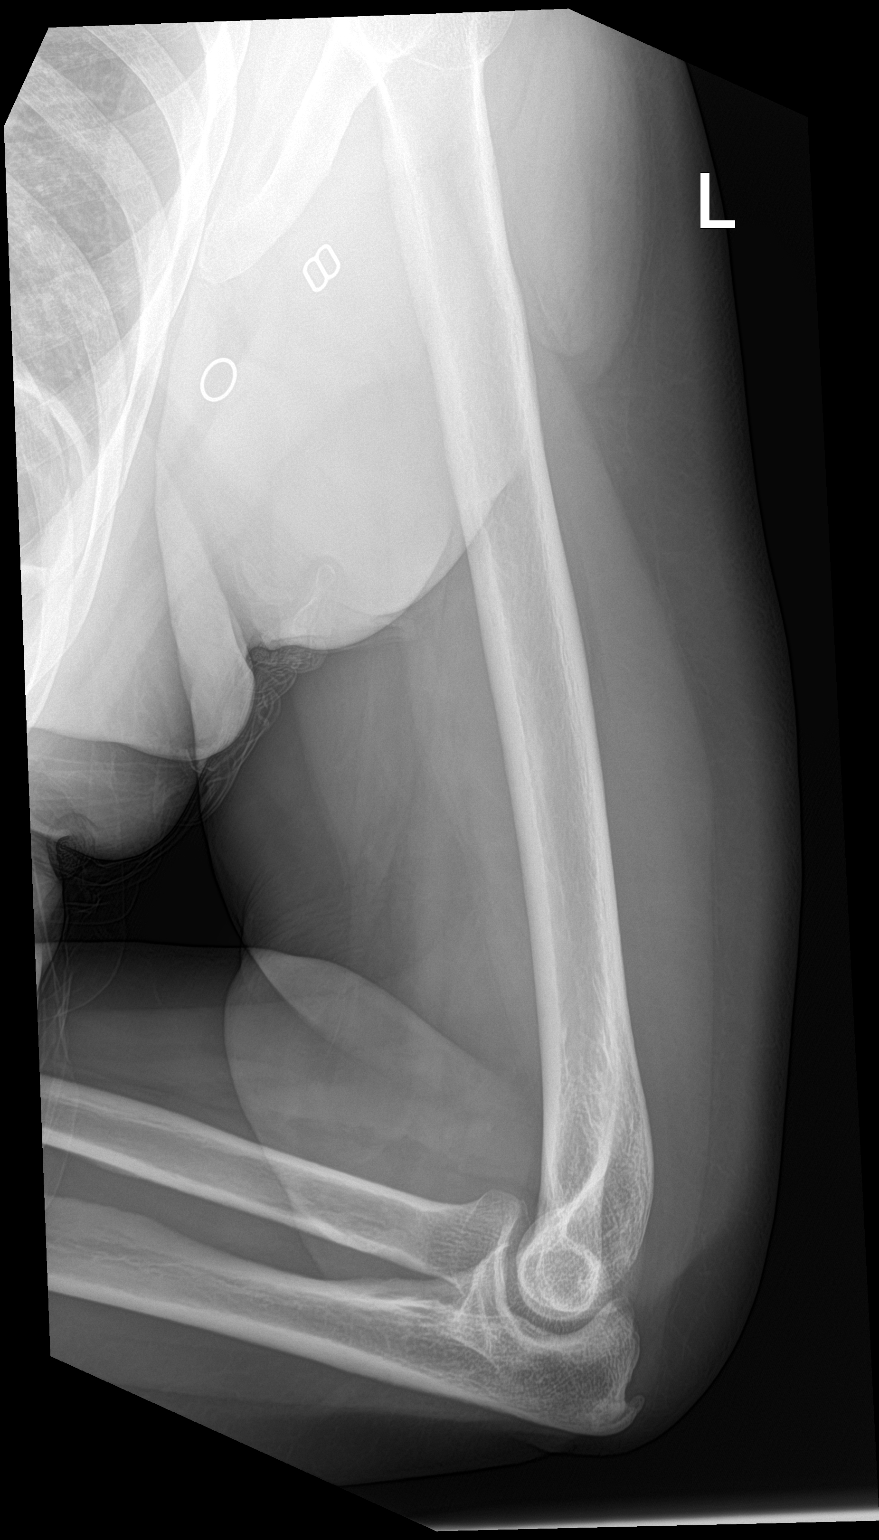

[4 of 4 positions shown; findings below may reference images not displayed]

FINDINGS: There is no evidence of fracture or other focal bone lesions. Soft
tissues are unremarkable.
IMPRESSION: Negative.

## 2018-01-08 IMAGING — DX DG HAND COMPLETE 3+V*L*
3 series · 3 of 3 positions shown · non-contrast
Comparison: None.

CLINICAL DATA: Swelling intermittent numbness to left upper
extremity.

EXAM:
LEFT HAND - COMPLETE 3+ VIEW

[hand pa]
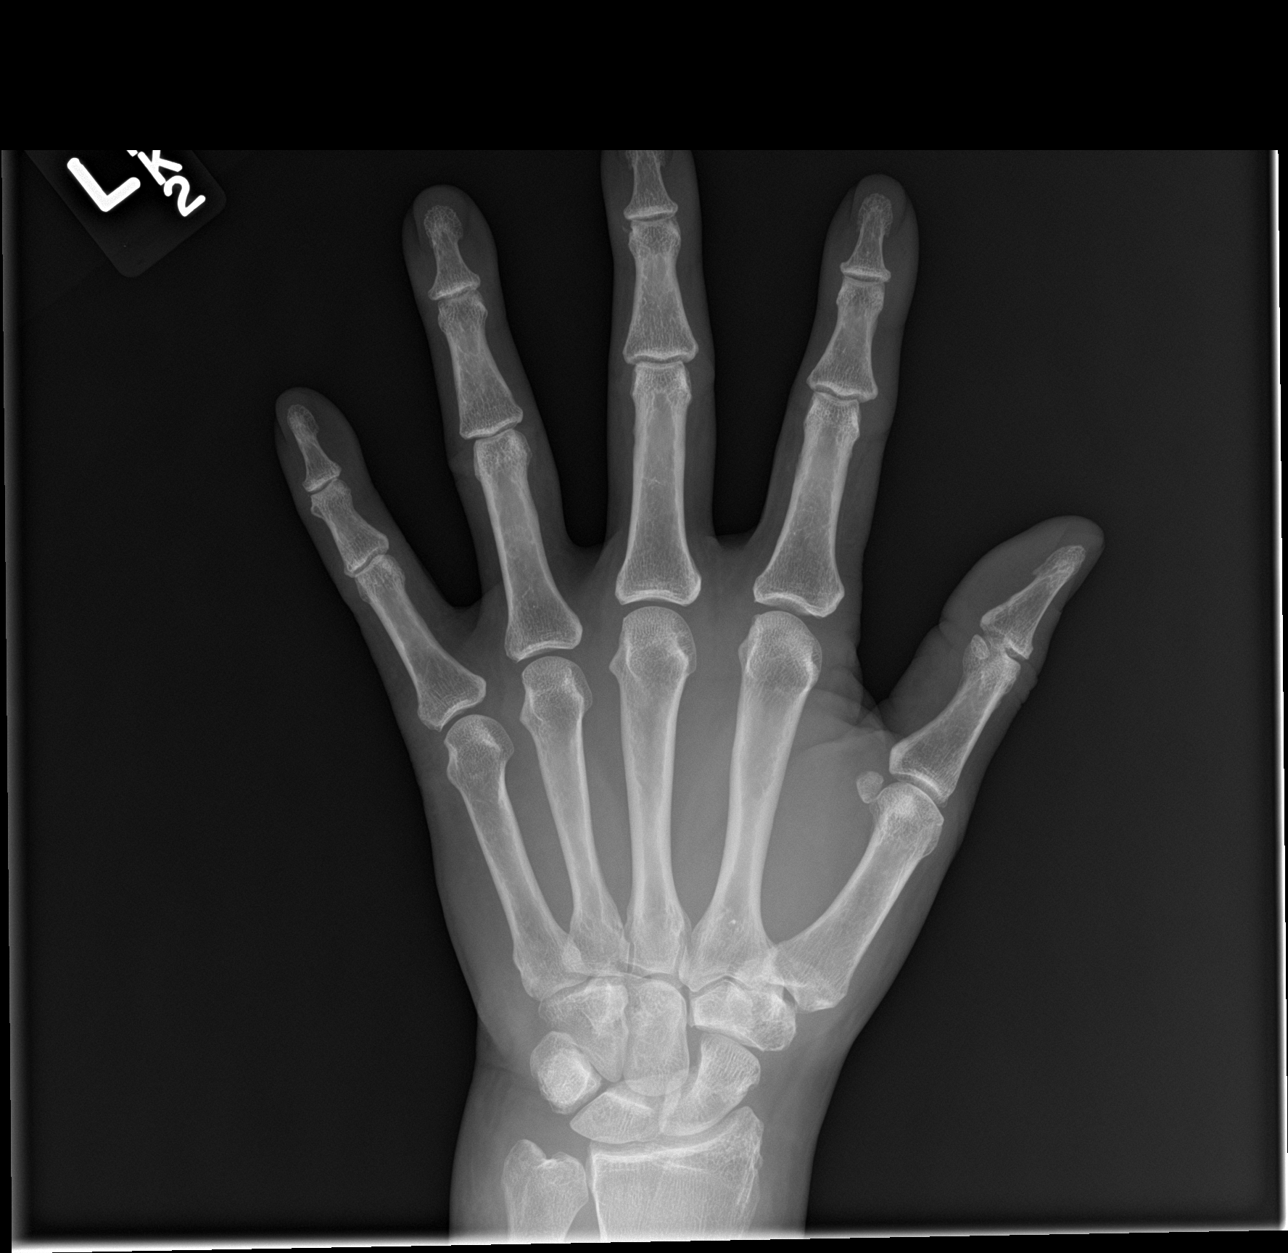

[hand obl]
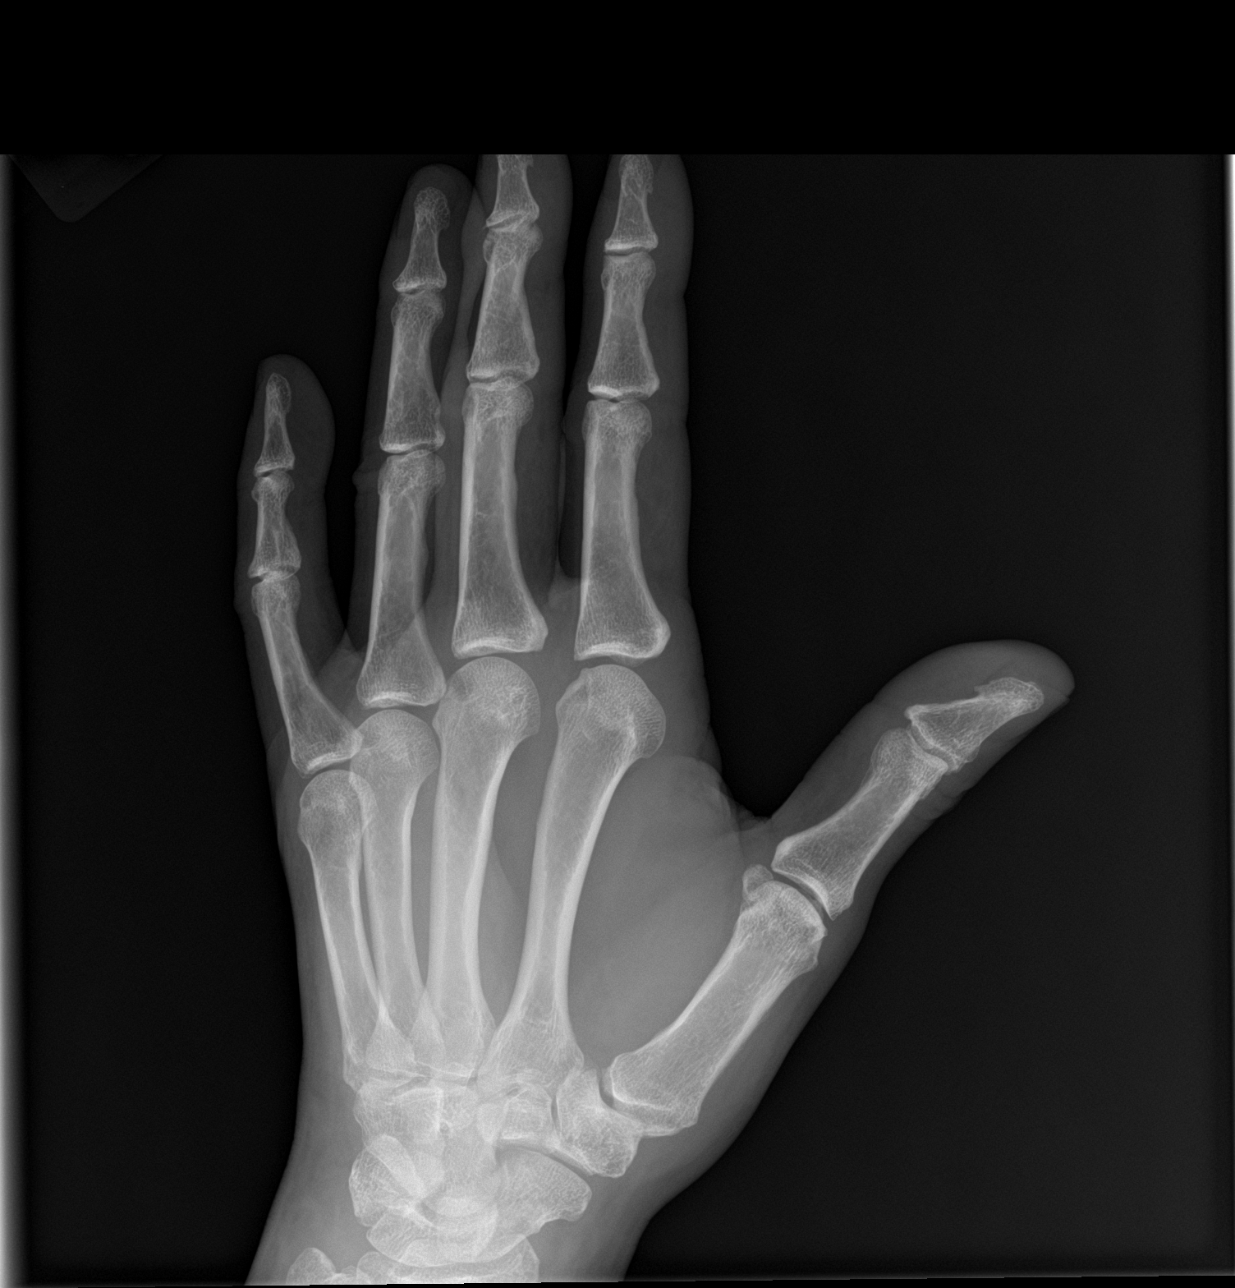

[hand lat]
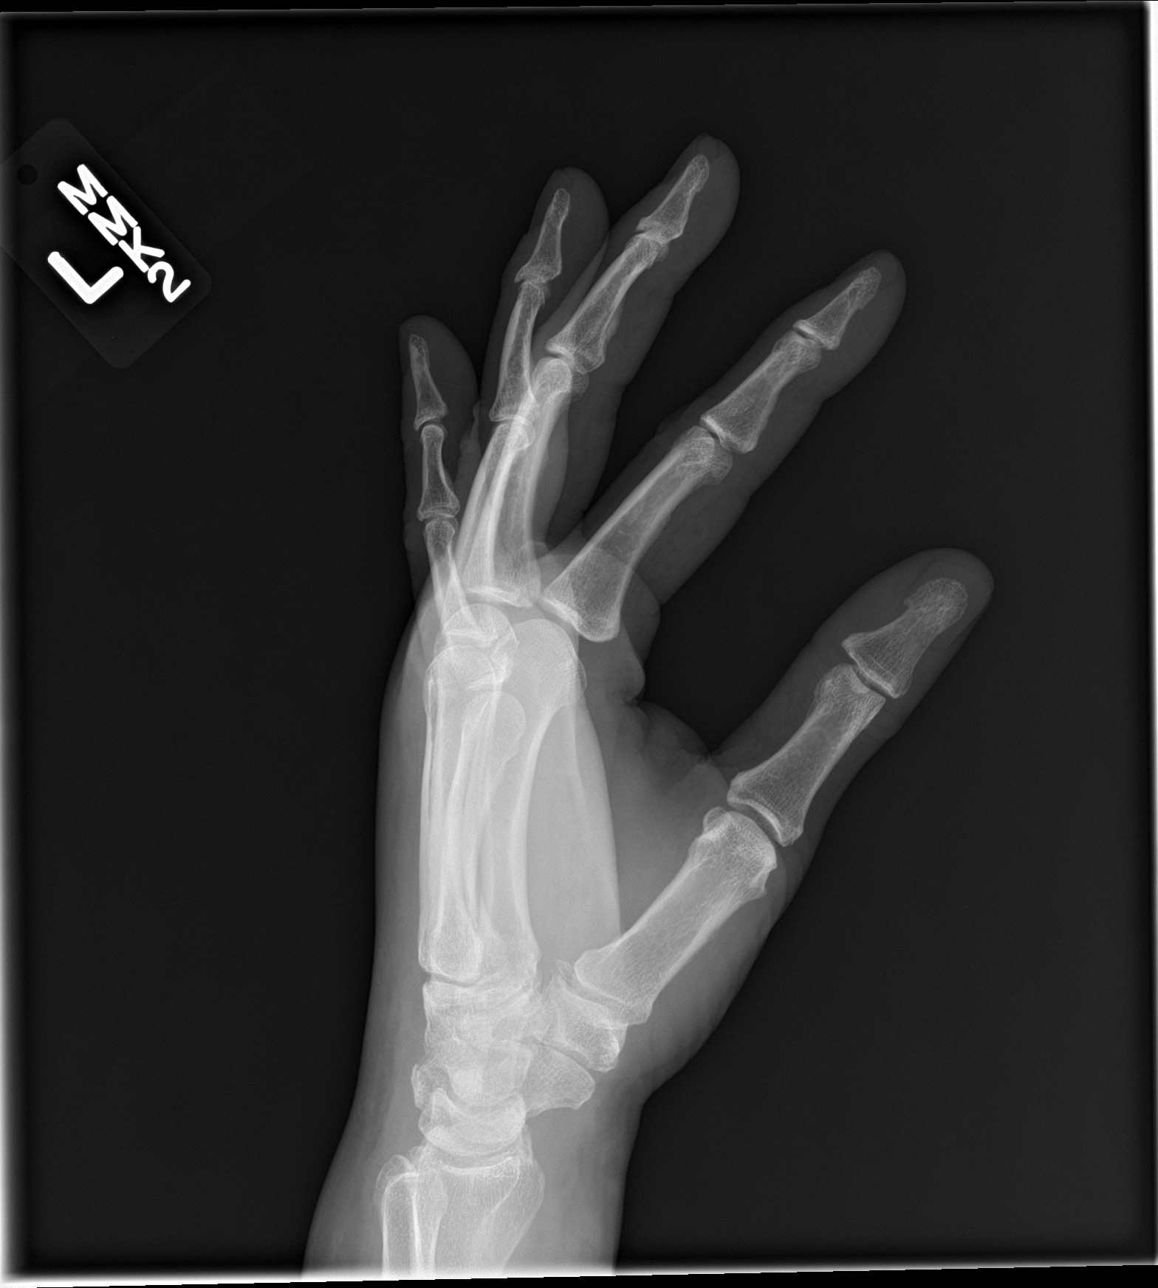

[3 of 3 positions shown; findings below may reference images not displayed]

FINDINGS: There is no evidence of fracture or dislocation. There is no
evidence of arthropathy or other focal bone abnormality. Soft
tissues are unremarkable.
IMPRESSION: Negative.

## 2018-01-13 ENCOUNTER — Ambulatory Visit: Payer: Self-pay | Admitting: Cardiovascular Disease

## 2018-01-21 ENCOUNTER — Ambulatory Visit: Payer: Self-pay

## 2018-01-22 ENCOUNTER — Encounter: Payer: Medicare HMO | Admitting: Vascular Surgery

## 2018-01-22 ENCOUNTER — Ambulatory Visit (INDEPENDENT_AMBULATORY_CARE_PROVIDER_SITE_OTHER): Payer: Medicare HMO

## 2018-01-22 VITALS — BP 134/82 | HR 77 | Temp 98.5°F | Resp 14 | Ht 66.0 in | Wt 214.4 lb

## 2018-01-22 DIAGNOSIS — Z0000 Encounter for general adult medical examination without abnormal findings: Secondary | ICD-10-CM | POA: Diagnosis not present

## 2018-01-22 NOTE — Progress Notes (Signed)
Subjective:   Valerie Vazquez is a 62 y.o. female who presents for Medicare Annual (Subsequent) preventive examination.  Review of Systems:  No ROS.  Medicare Wellness Visit. Additional risk factors are reflected in the social history. Cardiac Risk Factors include: hypertension;diabetes mellitus     Objective:     Vitals: BP 134/82 (BP Location: Left Arm, Patient Position: Sitting, Cuff Size: Normal)   Pulse 77   Temp 98.5 F (36.9 C) (Oral)   Resp 14   Ht 5\' 6"  (1.676 m)   Wt 214 lb 6.4 oz (97.3 kg)   SpO2 95%   BMI 34.61 kg/m   Body mass index is 34.61 kg/m.  Advanced Directives 01/22/2018 06/24/2017 03/28/2017 02/22/2017 02/17/2017 02/04/2017 01/20/2017  Does Patient Have a Medical Advance Directive? No No No No No No No  Would patient like information on creating a medical advance directive? No - Patient declined - - - - No - Patient declined Yes (MAU/Ambulatory/Procedural Areas - Information given)    Tobacco Social History   Tobacco Use  Smoking Status Former Smoker  . Packs/day: 1.50  . Years: 10.00  . Pack years: 15.00  . Types: Cigarettes  . Last attempt to quit: 04/12/1979  . Years since quitting: 38.8  Smokeless Tobacco Never Used     Counseling given: Not Answered   Clinical Intake:  Pre-visit preparation completed: Yes  Pain : No/denies pain     Nutritional Status: BMI > 30  Obese Diabetes: Yes  How often do you need to have someone help you when you read instructions, pamphlets, or other written materials from your doctor or pharmacy?: 1 - Never  Interpreter Needed?: No     Past Medical History:  Diagnosis Date  . Anemia   . Bilateral renal cysts   . Chronic back pain    "upper and lower" (09/19/2014)  . Depression    "major" (09/19/2014)  . DVT (deep venous thrombosis) ([place])   . Esophageal spasm   . Gallstones   . Gastritis   . Gastroenteritis   . GERD (gastroesophageal reflux disease)   . Headache    "couple /month lately"  (09/19/2014)  . High blood pressure   . High cholesterol   . History of blood transfusion 1985   related to hysterectomy  . History of nuclear stress test    Myoview 1/19:  EF 65, no ischemia or scar  . Hypertension   . IBS (irritable bowel syndrome)   . Migraine    "a few times/yr" (09/19/2014)  . Myocardial infarction ([place]) 10/2010 X 3   "while hospitalized"  . Osteoarthritis    "qwhere; mostly around my joints" (09/19/2014)  . Pneumonia 04/2014  . PTSD (post-traumatic stress disorder)   . Pulmonary embolism ([place]) 04/2001; 09/19/2014   "after gallbladder OR; "  . Schizoaffective disorder ([place])   . Sleep apnea    "mild" (09/19/2014)  . Snoring    a. sleep study 5/16: No OSA  . Spinal stenosis   . Spondylosis   . Type II diabetes mellitus ([place])    "dx'd in 2007; lost weight; no RX for ~ 4 yr now" (09/19/2014)   Past Surgical History:  Procedure Laterality Date  . ABDOMINAL HYSTERECTOMY  1985  . ANTERIOR CERVICAL DECOMP/DISCECTOMY FUSION  10/2012   in [place] C5-C7   . APPENDECTOMY  1985  . BACK SURGERY    . BREAST CYST EXCISION Right   . BREAST EXCISIONAL BIOPSY Right YRS AGO   NEG  .  CARDIAC CATHETERIZATION   2000's; 2009  . CHOLECYSTECTOMY  2002  . COLONOSCOPY WITH PROPOFOL N/A 12/12/2016   Procedure: COLONOSCOPY WITH PROPOFOL;  Surgeon: [name], MD;  Location: [facility] ENDOSCOPY;  Service: Endoscopy;  Laterality: N/A;  . COLONOSCOPY WITH PROPOFOL N/A 02/17/2017   Procedure: COLONOSCOPY WITH PROPOFOL;  Surgeon: [name], MD;  Location[facility] ENDOSCOPY;  Service: Gastroenterology;  Laterality: N/A;  . ESOPHAGOGASTRODUODENOSCOPY (EGD) WITH PROPOFOL N/A 02/17/2017   Procedure: ESOPHAGOGASTRODUODENOSCOPY (EGD) WITH PROPOFOL;  Surgeon: [name], MD;  Location: [facility] ENDOSCOPY;  Service: Gastroenterology;  Laterality: N/A;  . EXCISION/RELEASE BURSA HIP Right 12/1984  . HERNIA REPAIR  1985  . LEFT HEART CATHETERIZATION WITH CORONARY ANGIOGRAM N/A 12/09/2014   Procedure: LEFT HEART  CATHETERIZATION WITH CORONARY ANGIOGRAM;  Surgeon: Burnell Blanks, MD;  Location: [facility] CATH LAB;  Service: Cardiovascular;  Laterality: N/A;  . TONSILLECTOMY AND ADENOIDECTOMY  ~ 1968  . TOTAL HIP ARTHROPLASTY Left 04-06-2014  . UMBILICAL HERNIA REPAIR  1985  . VENA CAVA FILTER PLACEMENT  03/2014   Family History  Problem Relation Age of Onset  . Stroke Mother        Deceased  . Lung cancer Father        Deceased  . Healthy Daughter   . Healthy Son        x 2  . Other Son        Suicide  . Cancer Brother   . Heart attack Neg Hx    Social History   Socioeconomic History  . Marital status: Divorced    Spouse name: Not on file  . Number of children: Not on file  . Years of education: Not on file  . Highest education level: Not on file  Occupational History  . Not on file  Social Needs  . Financial resource strain: Not hard at all  . Food insecurity:    Worry: Never true    Inability: Never true  . Transportation needs:    Medical: No    Non-medical: No  Tobacco Use  . Smoking status: Former Smoker    Packs/day: 1.50    Years: 10.00    Pack years: 15.00    Types: Cigarettes    Last attempt to quit: 04/12/1979    Years since quitting: 38.8  . Smokeless tobacco: Never Used  Substance and Sexual Activity  . Alcohol use: No    Alcohol/week: 0.0 oz  . Drug use: No  . Sexual activity: Not Currently  Lifestyle  . Physical activity:    Days per week: 0 days    Minutes per session: Not on file  . Stress: Not on file  Relationships  . Social connections:    Talks on phone: Not on file    Gets together: Not on file    Attends religious service: Not on file    Active member of club or organization: Not on file    Attends meetings of clubs or organizations: Not on file    Relationship status: Not on file  Other Topics Concern  . Not on file  Social History Narrative   Lives with fiancee in an apartment on the first floor.  Has 3 grown children.  One child passed  away.  On disability 1996 - 2000, 2007 - current.     Education: some college.    Outpatient Encounter Medications as of 01/22/2018  Medication Sig  . carvedilol (COREG) 3.125 MG tablet TAKE 1 TABLET (3.125 MG TOTAL) BY MOUTH 2 (  TWO) TIMES DAILY WITH A MEAL.  . cyclobenzaprine (FLEXERIL) 10 MG tablet TAKE 1 TABLET BY MOUTH THREE TIMES A DAY AS NEEDED FOR MUSCLE SPASMS  . dicyclomine (BENTYL) 20 MG tablet TAKE 1 TABLET BY MOUTH 3 TIMES A DAY AS NEEDED FOR SPASMS  . diphenhydrAMINE (BENADRYL) 25 MG tablet Take 1 tablet (25 mg total) by mouth every 6 (six) hours.  Marland Kitchen ELIQUIS 5 MG TABS tablet TAKE 1 TABLET (5 MG TOTAL) BY MOUTH 2 (TWO) TIMES DAILY.  Marland Kitchen escitalopram (LEXAPRO) 20 MG tablet Take 1 tablet (20 mg total) by mouth daily.  Marland Kitchen gabapentin (NEURONTIN) 600 MG tablet Take 1-1.5 tablets (600-900 mg total) by mouth 2 (two) times daily. Take 1 tablet in the AM and 1.5 tablet at bedtime  . levothyroxine (SYNTHROID, LEVOTHROID) 25 MCG tablet Take 1 tablet (25 mcg total) by mouth daily before breakfast. Wait 2-3 hours before taking any other medication or supplement  . Multiple Vitamins-Minerals (SUPER THERA VITE M) TABS Take by mouth.  . neomycin-polymyxin b-dexamethasone (MAXITROL) 3.5-10000-0.1 OINT APPLY A SMALL AMOUNT IN RIGHT EYE 2 TIMES A DAY FOR 14 DAYS  . NIFEdipine (PROCARDIA) 10 MG capsule TAKE 1 CAPSULE BY MOUTH 2 TIMES DAILY. TAKE 30 MINS BEFORE MEALS UP TO 1X PER DAY  . OLANZapine (ZYPREXA) 10 MG tablet Take 1 tablet (10 mg total) by mouth at bedtime.  . ondansetron (ZOFRAN) 4 MG tablet Take 1 tablet (4 mg total) by mouth 2 (two) times daily as needed for nausea or vomiting.  . pantoprazole (PROTONIX) 40 MG tablet TAKE 1 TABLET BY MOUTH EVERY DAY  . pravastatin (PRAVACHOL) 10 MG tablet Take 1 tablet (10 mg total) by mouth at bedtime.  . ramelteon (ROZEREM) 8 MG tablet Take 1 tablet (8 mg total) by mouth at bedtime.  . sitaGLIPtin (JANUVIA) 50 MG tablet Take 1 tablet (50 mg total) by mouth  daily.   No facility-administered encounter medications on file as of 01/22/2018.     Activities of Daily Living In your present state of health, do you have any difficulty performing the following activities: 01/22/2018  Hearing? N  Vision? N  Difficulty concentrating or making decisions? Y  Comment Difficulty focusing on 1 topic.    Walking or climbing stairs? N  Dressing or bathing? N  Doing errands, shopping? N  Preparing Food and eating ? N  Using the Toilet? N  In the past six months, have you accidently leaked urine? N  Do you have problems with loss of bowel control? N  Managing your Medications? N  Managing your Finances? N  Housekeeping or managing your Housekeeping? N  Some recent data might be hidden    Patient Care Team: [name], MD as PCP - General (Family Medicine) Burnell Blanks, MD as PCP - Cardiology (Cardiology)    Assessment:   This is a routine wellness examination for Lainey.  The goal of the wellness visit is to assist the patient how to close the gaps in care and create a preventative care plan for the patient.   The roster of all physicians providing medical care to patient is listed in the Snapshot section of the chart.  Osteoporosis risk reviewed.    Safety issues reviewed; Smoke and carbon monoxide detectors in the home. No firearms in the home. Wears seatbelts when driving or riding with others. No violence in the home.  They do not have excessive sun exposure.  Discussed the need for sun protection: hats, long sleeves and  the use of sunscreen if there is significant sun exposure.  Patient is alert, normal appearance, oriented to person/place/and time.  Correctly identified the president of the [place] and recalls of 2/3 words. Performs simple calculations and can read correct time from watch face. Displays appropriate judgement.  No new identified risk were noted.  No failures at ADL's or IADL's.  Ambulates with walker as  needed.   BMI- discussed the importance of a healthy diet, water intake and the benefits of aerobic exercise. Educational material provided.   24 hour diet recall: Eating 1 meal per day due to the desire to lose weight and lack of appetite until late at night.  Appointment scheduled to follow up with pcp.  Eye- Visual acuity not assessed per patient preference since they have regular follow up with the ophthalmologist.  Wears corrective lenses.  Sleep patterns- Sleeps through the night without issues. Taking medication as prescribed.   Health maintenance gaps discussed.   Exercise Activities and Dietary recommendations Current Exercise Habits: The patient does not participate in regular exercise at present  Goals    . EAT 3 MEALS DAILY    . Increase physical activity     Chair exercises       Fall Risk Fall Risk  01/22/2018 11/05/2017 01/20/2017 08/20/2016 01/29/2016  Falls in the past year? Yes No Yes No No  Number falls in past yr: 2 or more - 1 - -  Injury with Fall? No - Yes - -  Comment - - States she hurt L knee 3 weeks ago due to missing a step.  She did not seek medical attention. - -  Risk Factor Category  - - - - -  Risk for fall due to : History of fall(s) - - - -  Follow up - - Falls prevention discussed;Education provided - -   Depression Screen PHQ 2/9 Scores 01/22/2018 11/05/2017 01/20/2017 08/20/2016  PHQ - 2 Score 0 0 0 0  PHQ- 9 Score - - 4 -     Cognitive Function MMSE - Mini Mental State Exam 01/22/2018 01/20/2017  Orientation to time 5 5  Orientation to Place 5 5  Registration 3 3  Attention/ Calculation 5 5  Recall 2 0  Language- name 2 objects 2 2  Language- repeat 1 1  Language- follow 3 step command 3 3  Language- read & follow direction 1 1  Write a sentence 1 1  Copy design 1 1  Total score 29 27     6CIT Screen 01/20/2017  What Year? 0 points  What month? 0 points  What time? 0 points  Count back from 20 0 points  Months in reverse 0  points  Repeat phrase 10 points  Total Score 10     There is no immunization history on file for this patient.  Screening Tests Health Maintenance  Topic Date Due  . PNEUMOCOCCAL POLYSACCHARIDE VACCINE (1) 04/02/1956  . FOOT EXAM  04/02/1964  . OPHTHALMOLOGY EXAM  04/02/1964  . URINE MICROALBUMIN  04/02/1964  . HIV Screening  04/02/1969  . TETANUS/TDAP  04/02/1973  . PAP SMEAR  04/03/1975  . INFLUENZA VACCINE  06/25/2018 (Originally 04/01/2018)  . MAMMOGRAM  02/24/2018  . HEMOGLOBIN A1C  02/26/2018  . COLONOSCOPY  02/17/2022  . Hepatitis C Screening  Completed      Plan:    End of life planning; Advance aging; Advanced directives discussed.  I have personally reviewed and noted the following in the patient's chart:   .  Medical and social history . Use of alcohol, tobacco or illicit drugs  . Current medications and supplements . Functional ability and status . Nutritional status . Physical activity . Advanced directives . List of other physicians . Hospitalizations, surgeries, and ER visits in previous 12 months . Vitals . Screenings to include cognitive, depression, and falls . Referrals and appointments  In addition, I have reviewed and discussed with patient certain preventive protocols, quality metrics, and best practice recommendations. A written personalized care plan for preventive services as well as general preventive health recommendations were provided to patient.     Varney Biles, LPN  [id number]

## 2018-01-22 NOTE — Patient Instructions (Addendum)
  Valerie Vazquez , Thank you for taking time to come for your Medicare Wellness Visit. I appreciate your ongoing commitment to your health goals. Please review the following plan we discussed and let me know if I can assist you in the future.   These are the goals we discussed: Goals    . EAT 3 MEALS DAILY    . Increase physical activity     Chair exercises       This is a list of the screening recommended for you and due dates:  Health Maintenance  Topic Date Due  . Pneumococcal vaccine (1) 04/02/1956  . Complete foot exam   04/02/1964  . Eye exam for diabetics  04/02/1964  . Urine Protein Check  04/02/1964  . HIV Screening  04/02/1969  . Tetanus Vaccine  04/02/1973  . Pap Smear  04/03/1975  . Flu Shot  06/25/2018*  . Mammogram  02/24/2018  . Hemoglobin A1C  02/26/2018  . Colon Cancer Screening  02/17/2022  .  Hepatitis C: One time screening is recommended by Center for Disease Control  (CDC) for  adults born from [birth year] through 1967.   Completed  *Topic was postponed. The date shown is not the original due date.

## 2018-01-25 IMAGING — MR MR HEAD W/O CM
10 series · 48 of 48 positions shown · non-contrast
Comparison: Head CT 09/01/2015

CLINICAL DATA: Decreased left-sided sensation. Increased tremors
since 3303.

EXAM:
MRI HEAD WITHOUT CONTRAST
TECHNIQUE: Multiplanar, multiecho pulse sequences of the brain and surrounding
structures were obtained without intravenous contrast.

[Series 2: T1 · sagittal · 5.0mm · 0.45mm/px · 2 of 23 slices shown (1 of 2)]
[im 1/23]
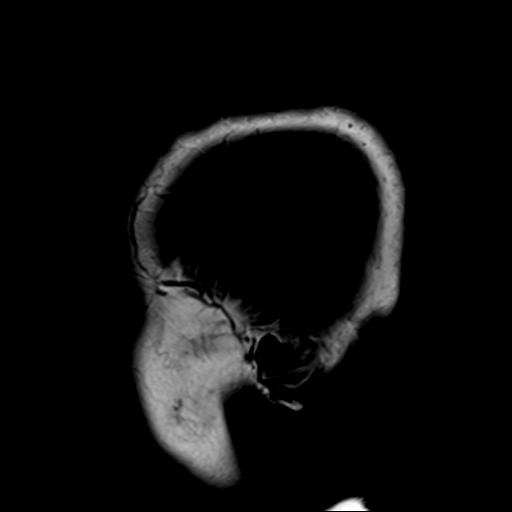
[im 23/23]
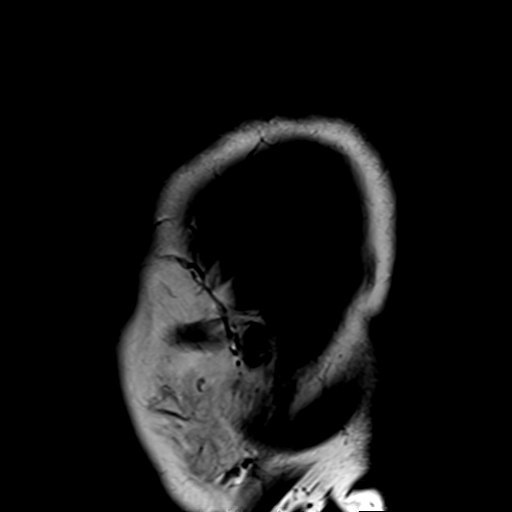

[Series 7: T2 · axial · 5.0mm · 0.60mm/px · z∈[-49,+107]mm · 3 of 25 slices shown (1 of 3)]
[im 1/25]
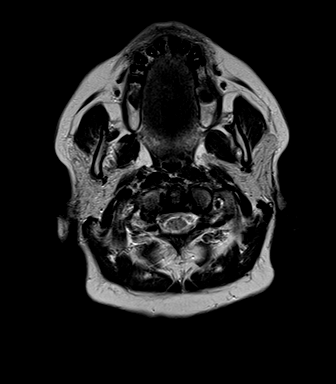
[im 13/25]
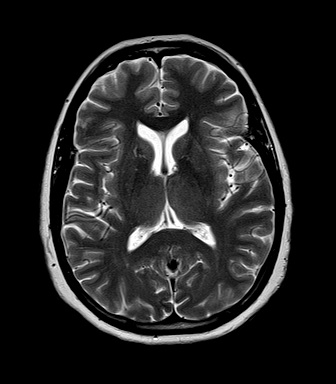
[im 25/25]
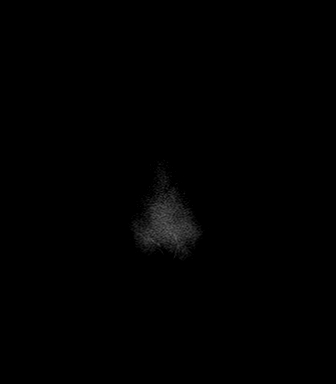

[Series 8: FLAIR · axial · 5.0mm · 0.45mm/px · z∈[-49,+107]mm · 3 of 25 slices shown]
[im 1/25]
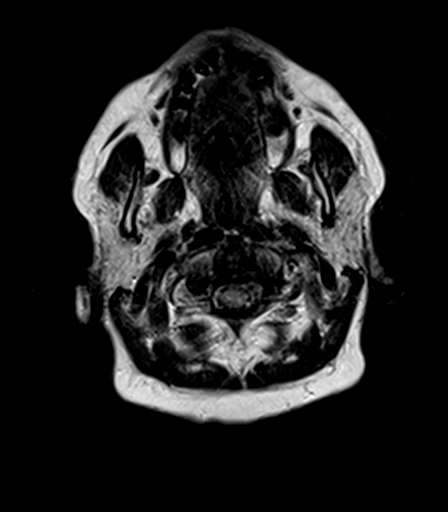
[im 13/25]
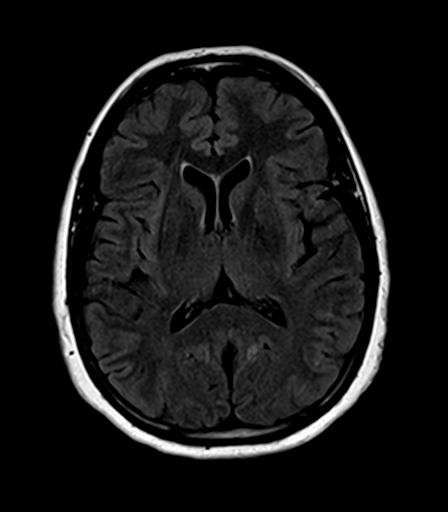
[im 25/25]
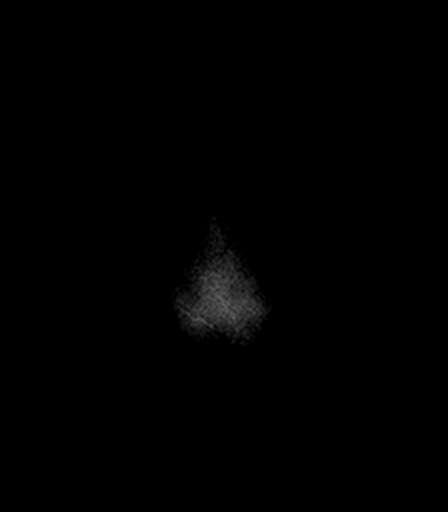

[Series 9: T2 · axial · 5.0mm · 0.45mm/px · z∈[-49,+107]mm · 3 of 25 slices shown (2 of 3)]
[im 1/25]
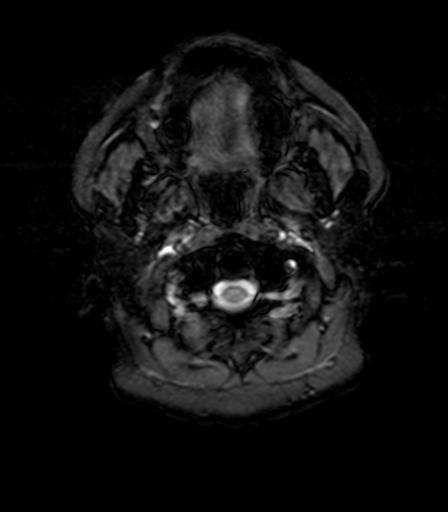
[im 13/25]
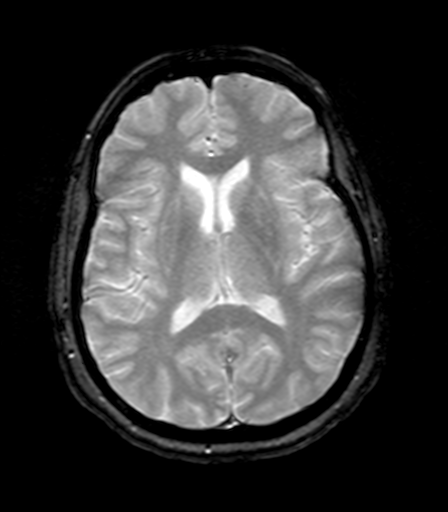
[im 25/25]
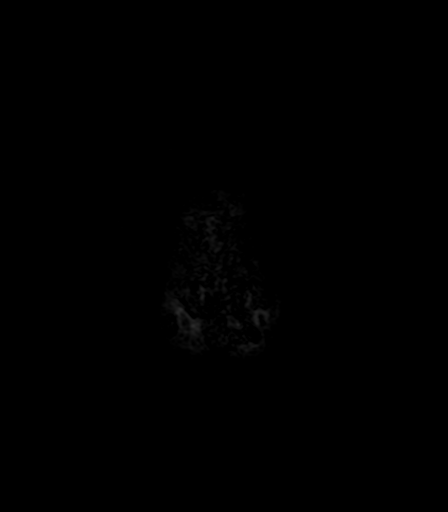

[Series 10: T1 · axial · 3.0mm · 1.00mm/px · z∈[-48,+105]mm · 6 of 52 slices shown (2 of 2)]
[im 1/52]
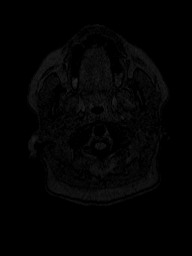
[im 11/52]
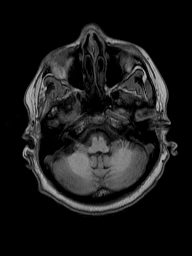
[im 21/52]
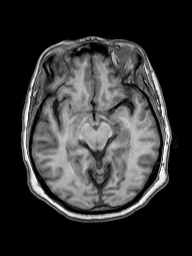
[im 31/52]
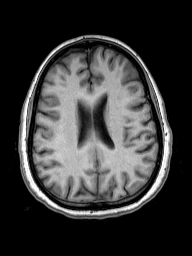
[im 41/52]
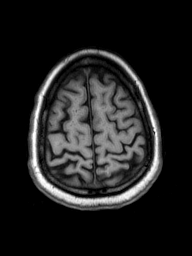
[im 52/52]
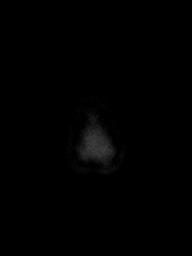

[Series 11: T2 · coronal · 3.0mm · 0.49mm/px · 6 of 47 slices shown (3 of 3)]
[im 1/47]
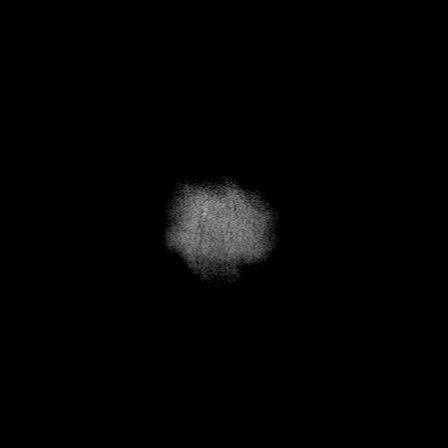
[im 10/47]
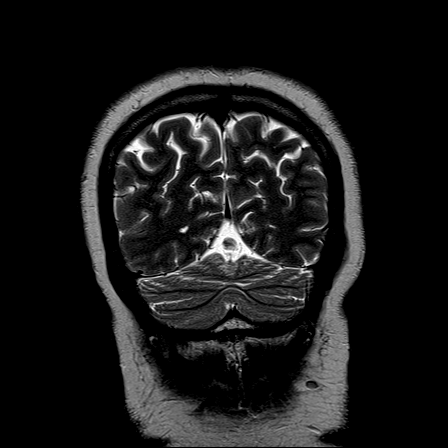
[im 19/47]
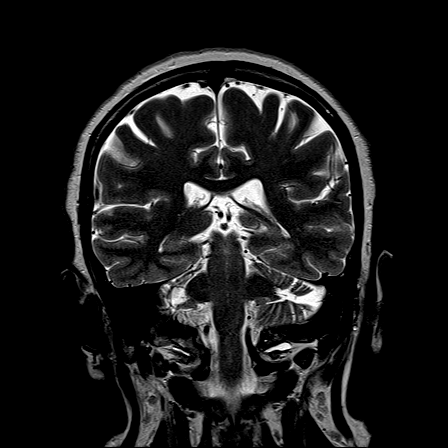
[im 28/47]
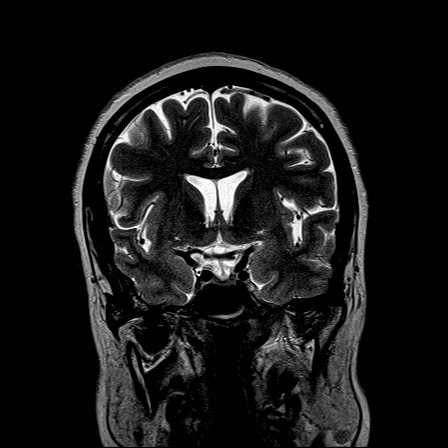
[im 37/47]
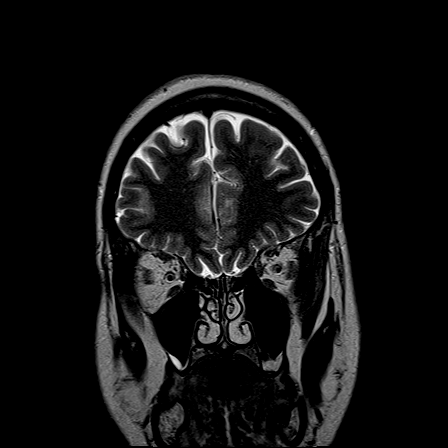
[im 47/47]
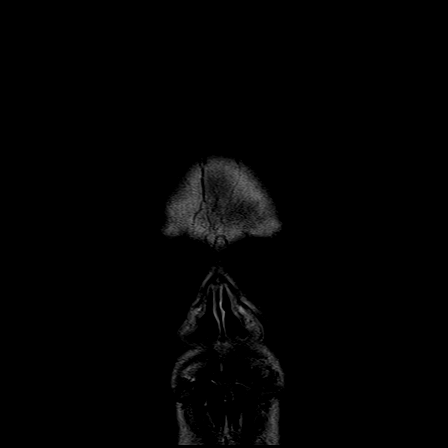

[Series 100: DWI · axial · 3.0mm · 1.80mm/px · z∈[-58,+104]mm · 7 of 54 slices shown]
[im 1/54]
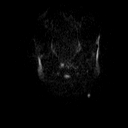
[im 9/54]
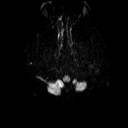
[im 18/54]
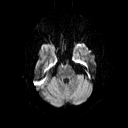
[im 27/54]
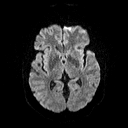
[im 36/54]
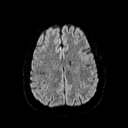
[im 45/54]
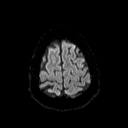
[im 54/54]
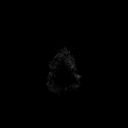

[Series 101: ADC · axial · 3.0mm · 1.80mm/px · z∈[-58,+104]mm · 6 of 51 slices shown (1 of 2)]
[im 1/51]
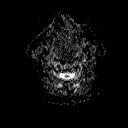
[im 11/51]
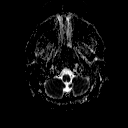
[im 21/51]
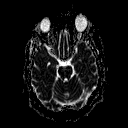
[im 31/51]
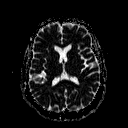
[im 41/51]
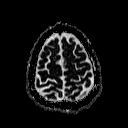
[im 51/51]
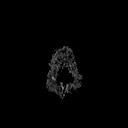

[Series 102: (id) coronal · coronal · 3.0mm · 1.80mm/px · 6 of 47 slices shown]
[im 1/47]
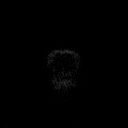
[im 10/47]
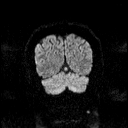
[im 19/47]
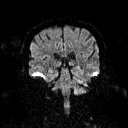
[im 28/47]
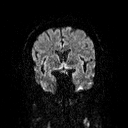
[im 37/47]
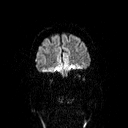
[im 47/47]
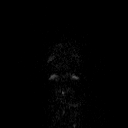

[Series 103: ADC · coronal · 3.0mm · 1.80mm/px · 6 of 47 slices shown (2 of 2)]
[im 1/47]
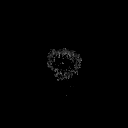
[im 10/47]
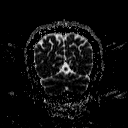
[im 19/47]
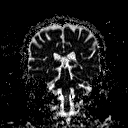
[im 28/47]
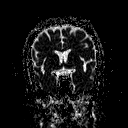
[im 37/47]
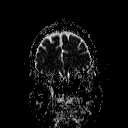
[im 47/47]
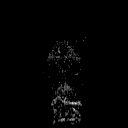

[48 of 48 positions shown; findings below may reference images not displayed]

FINDINGS: Brain Parenchyma: No acute infarct or intraparenchymal hemorrhage.
No focal parenchymal signal abnormality. No mass lesion or midline
shift. The major intracranial flow voids are preserved. The midline
structures are normal.

Ventricles, Sulci and Extra-axial Spaces: Normal for age. No
extra-axial collection.

Paranasal Sinuses and Mastoids: No fluid levels or advanced mucosal
thickening.

Orbits: Normal.

Bones and Soft Tissues: The visualized skull base, calvarium and
extracranial soft tissues are normal.
IMPRESSION: Normal brain MRI.

## 2018-01-26 ENCOUNTER — Other Ambulatory Visit: Payer: Self-pay | Admitting: Family Medicine

## 2018-01-26 DIAGNOSIS — Z760 Encounter for issue of repeat prescription: Secondary | ICD-10-CM

## 2018-02-01 ENCOUNTER — Other Ambulatory Visit: Payer: Self-pay | Admitting: Family Medicine

## 2018-02-01 DIAGNOSIS — K224 Dyskinesia of esophagus: Secondary | ICD-10-CM

## 2018-02-01 IMAGING — DX DG CHEST 2V
2 series · 2 of 2 positions shown · non-contrast
Comparison: Chest radiograph dated 01/09/2016

CLINICAL DATA: 59-year-old female with chest pain and shortness of
breath

EXAM:
CHEST  2 VIEW

[chest pa]
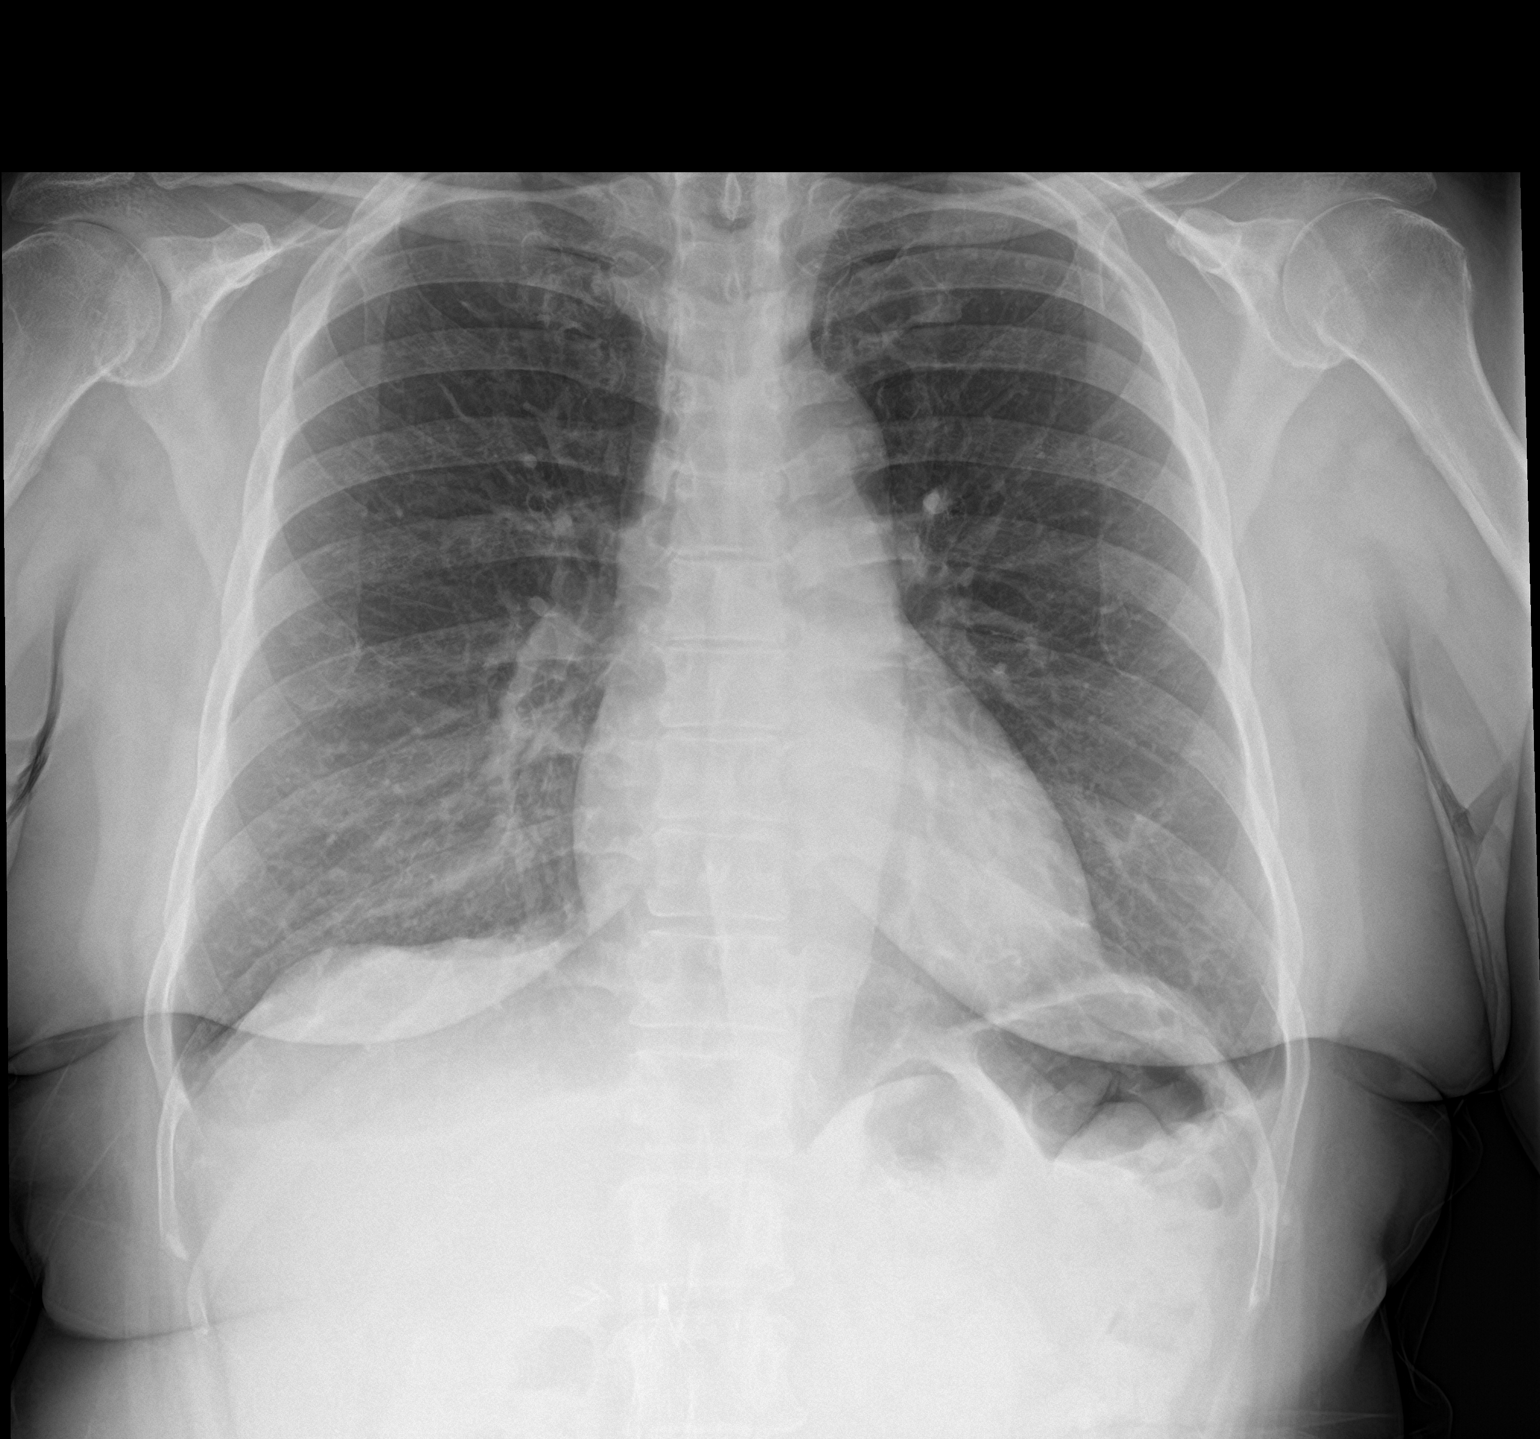

[chest lat]
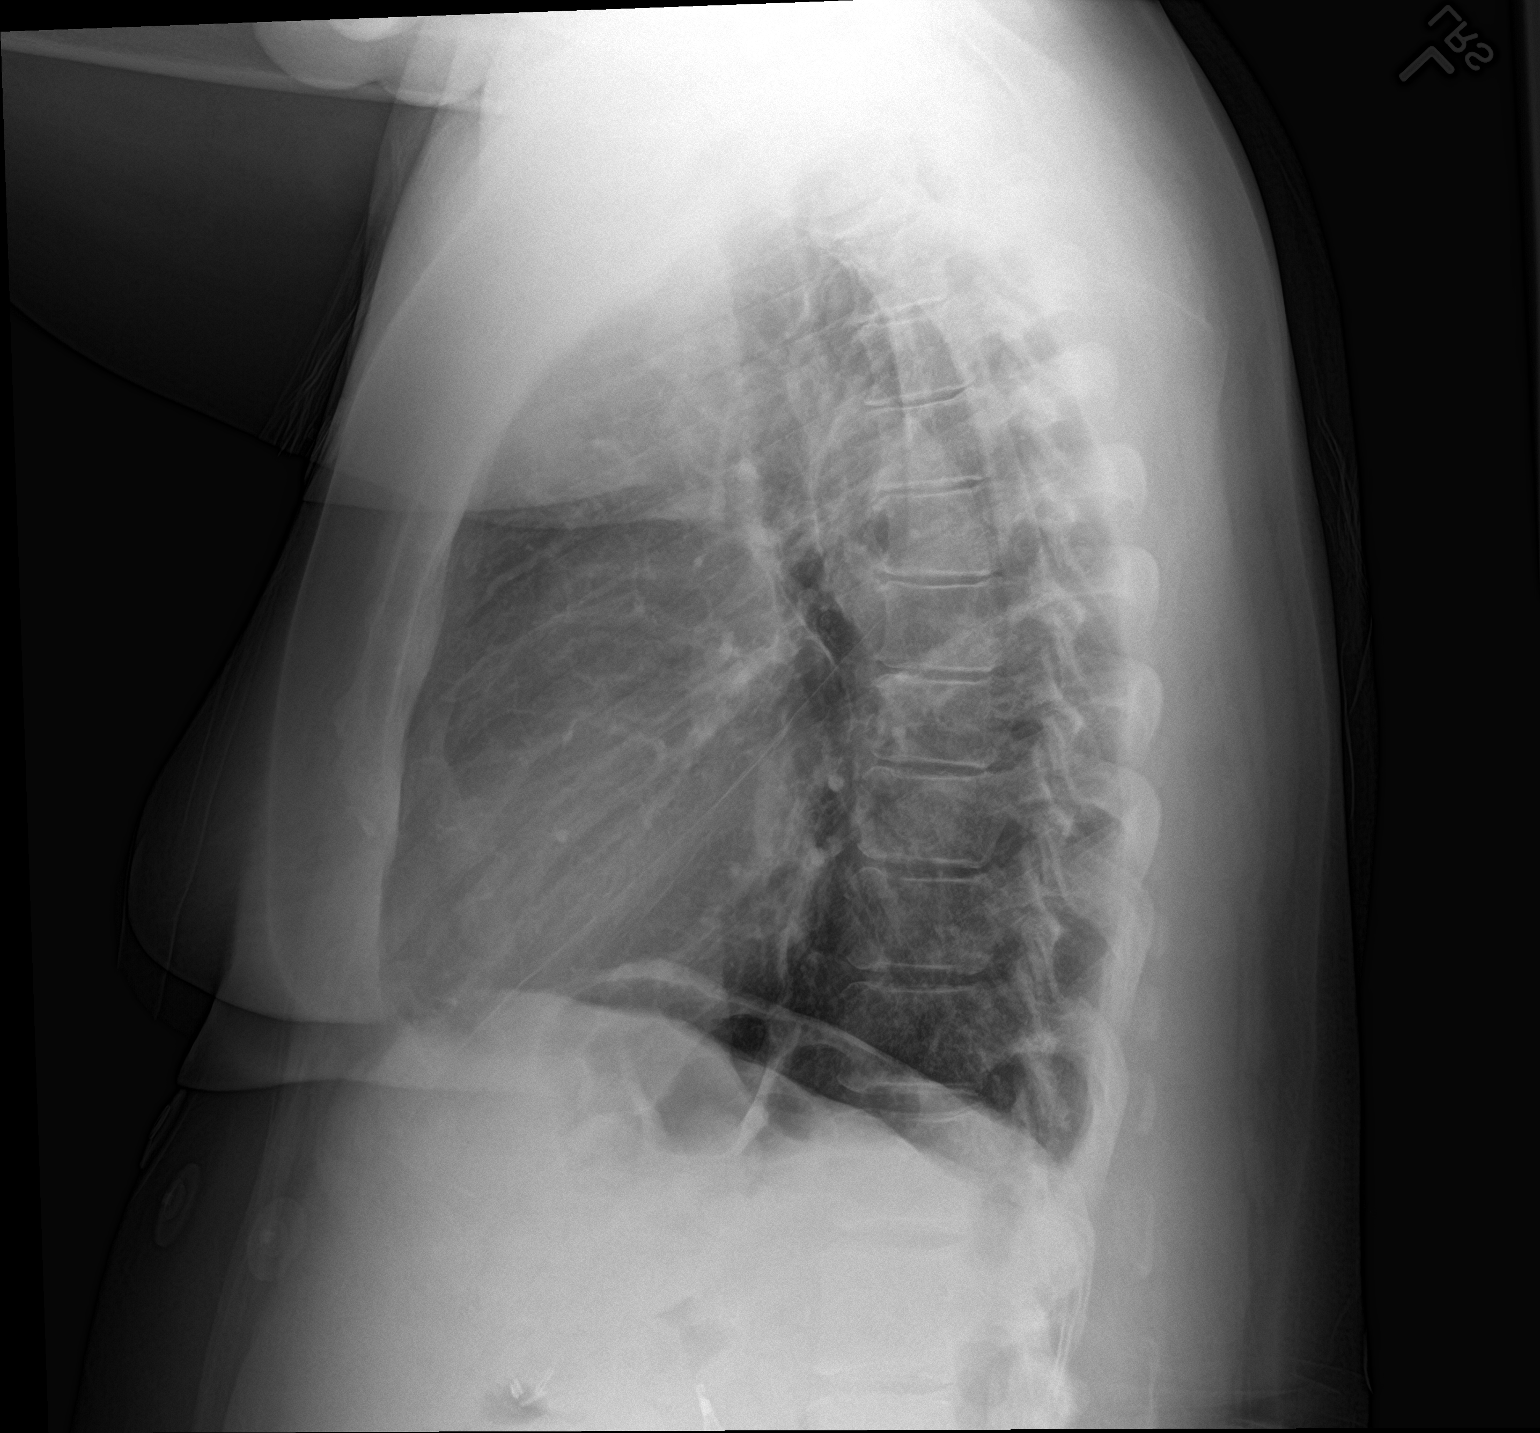

[2 of 2 positions shown; findings below may reference images not displayed]

FINDINGS: Two views of the chest demonstrate clear lungs. There is no pleural
effusion or pneumothorax. The cardiac silhouette is within normal
limits. Lower cervical fixation plate and screws. No acute osseous
pathology.
IMPRESSION: No active cardiopulmonary disease.

## 2018-02-01 NOTE — Telephone Encounter (Signed)
Last Ov 10/23/17 last filled 12/21/17 60 0rf

## 2018-02-02 ENCOUNTER — Ambulatory Visit: Payer: Medicare HMO | Admitting: Psychiatry

## 2018-02-02 ENCOUNTER — Ambulatory Visit: Payer: Medicare HMO | Admitting: Licensed Clinical Social Worker

## 2018-02-04 ENCOUNTER — Other Ambulatory Visit: Payer: Self-pay | Admitting: Family Medicine

## 2018-02-04 DIAGNOSIS — Z760 Encounter for issue of repeat prescription: Secondary | ICD-10-CM

## 2018-02-04 NOTE — Telephone Encounter (Signed)
Last OV 10/23/17 last filled  Cyclobenzaprine  11/16/17 90 0rf Protonix 07/28/17 60 0rf

## 2018-02-11 IMAGING — CR DG ABDOMEN ACUTE W/ 1V CHEST
4 series · 4 of 4 positions shown · non-contrast
Comparison: Chest radiograph March 26, 2016

CLINICAL DATA: Constipation, last bowel movement 5 days ago.

EXAM:
DG ABDOMEN ACUTE W/ 1V CHEST

[chest pa]
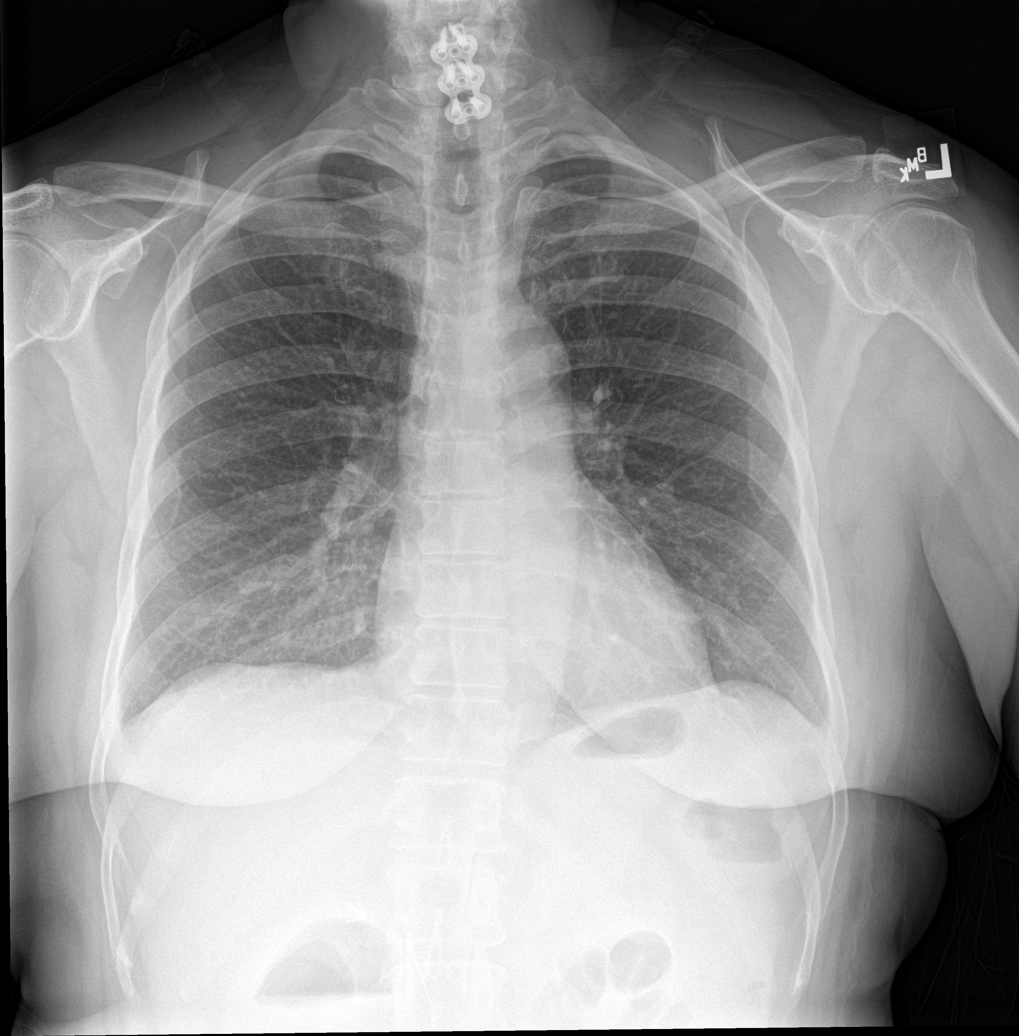

[abdomen erect]
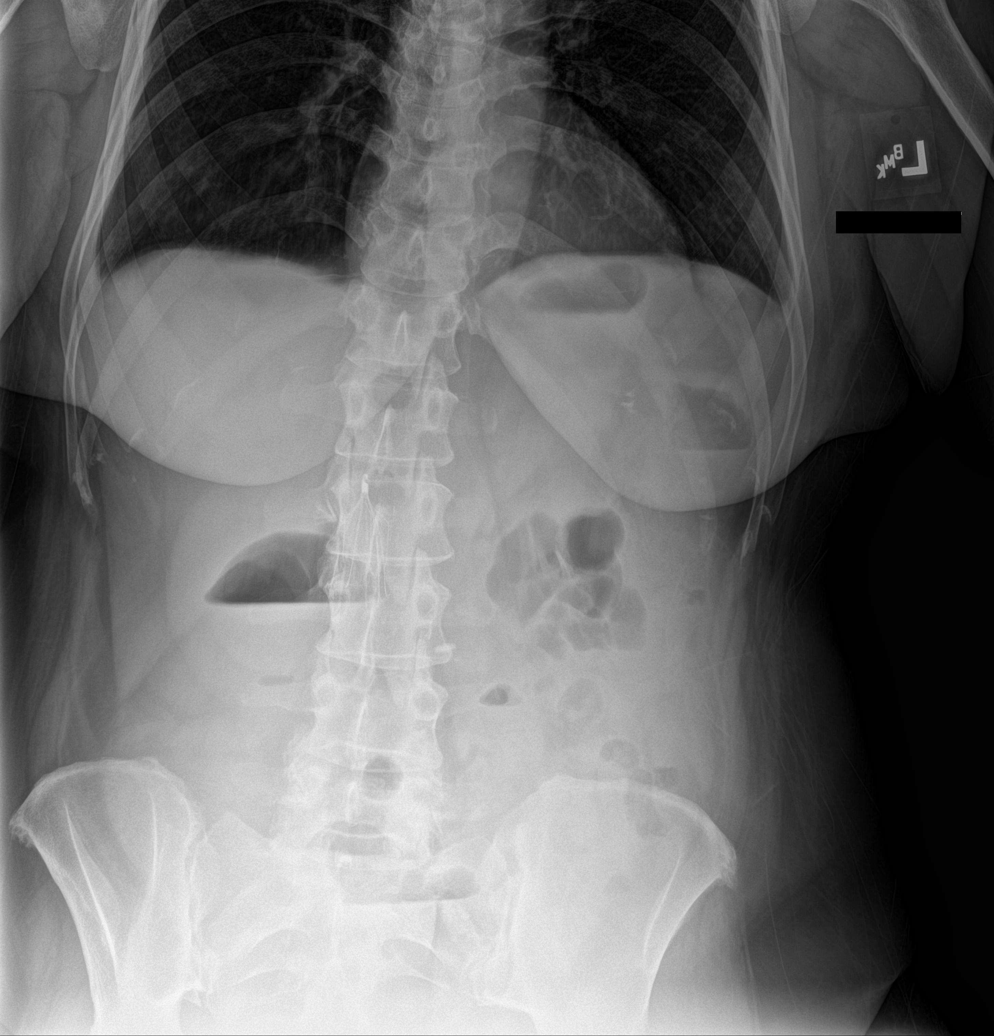

[abdomen supine (1 of 2)]
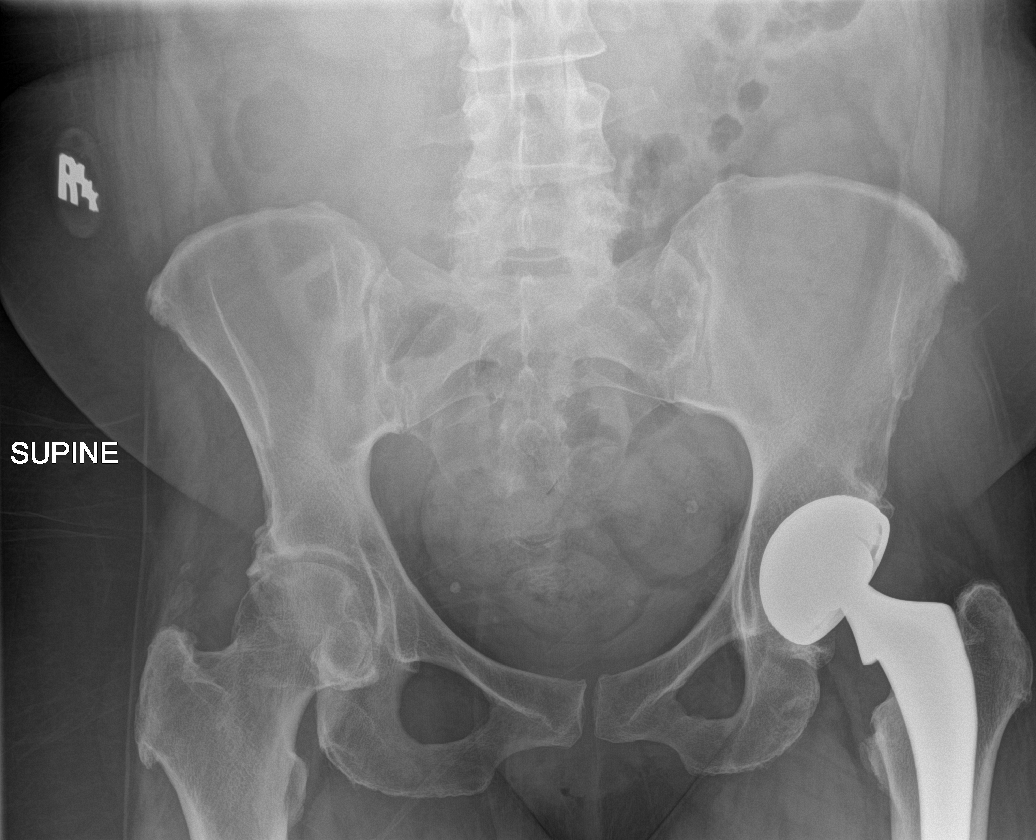

[abdomen supine (2 of 2)]
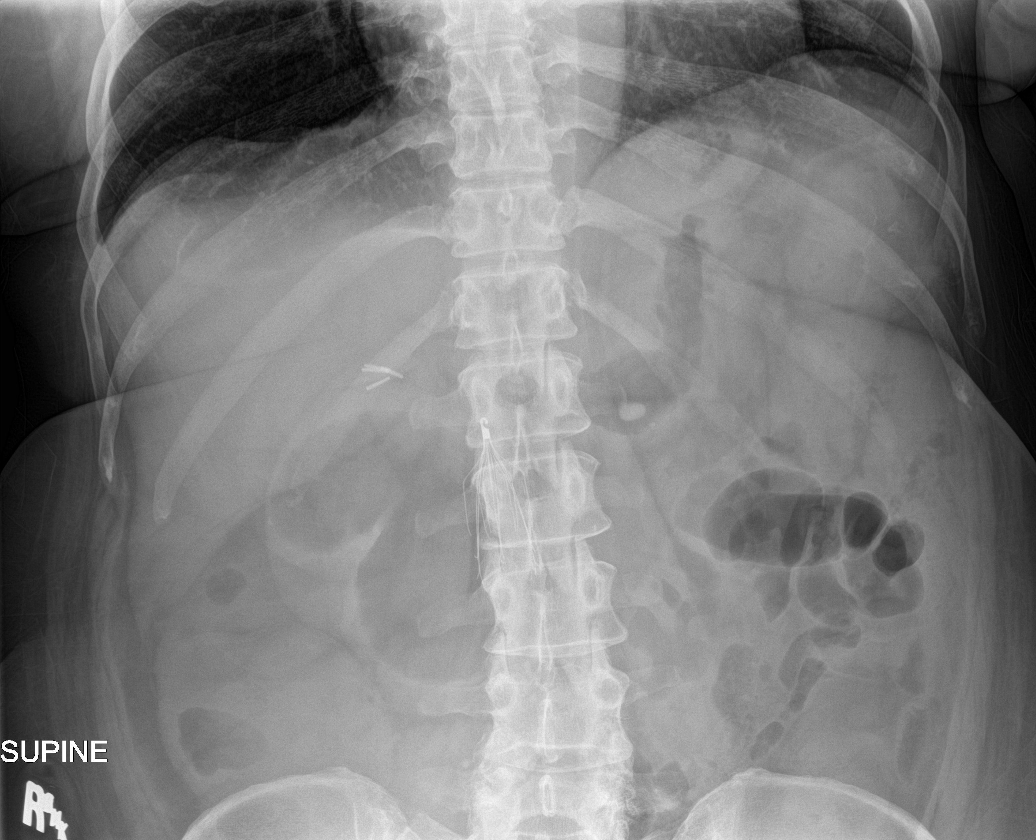

[4 of 4 positions shown; findings below may reference images not displayed]

FINDINGS: Cardiomediastinal silhouette is normal. Lungs are clear, no pleural
effusions. No pneumothorax. Soft tissue planes and included osseous
structures are unremarkable.

A few small bowel air-fluid levels without distention. Paucity of
large bowel gas. Moderate amount of retained large bowel stool .
Surgical clips in the included right abdomen compatible with
cholecystectomy. No intra-abdominal mass effect, pathologic
calcifications or free air. Soft tissue planes and included osseous
structures are non-suspicious. LEFT hip total arthroplasty.
IMPRESSION: Normal chest.

Nonspecific bowel gas pattern, paucity of large bowel gas. Moderate
amount of retained large bowel stool.

## 2018-02-11 IMAGING — CT CT ABD-PELV W/ CM
2 of 5 series · 15 of 46 positions shown, 17 images · IV contrast (iopamidol)
Comparison: 10/28/2014

CLINICAL DATA: Constipation for 5 days.

EXAM:
CT ABDOMEN AND PELVIS WITH CONTRAST
TECHNIQUE: Multidetector CT imaging of the abdomen and pelvis was performed
using the standard protocol following bolus administration of
intravenous contrast.
CONTRAST:  100mL 9IE2NA-LCC IOPAMIDOL (9IE2NA-LCC) INJECTION 61%

[Series 2: axial (person_name) (person_name) · axial · 0.72mm/px · z∈[-820,-414]mm · 12 of 91 slices shown, 14 images]
[im 5/91  soft-tissue]
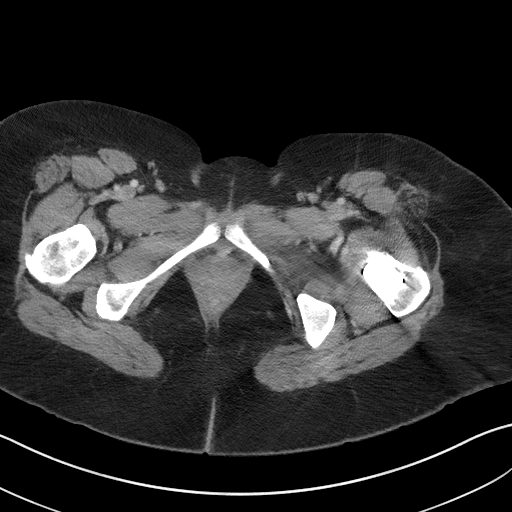
[im 5/91  bone]
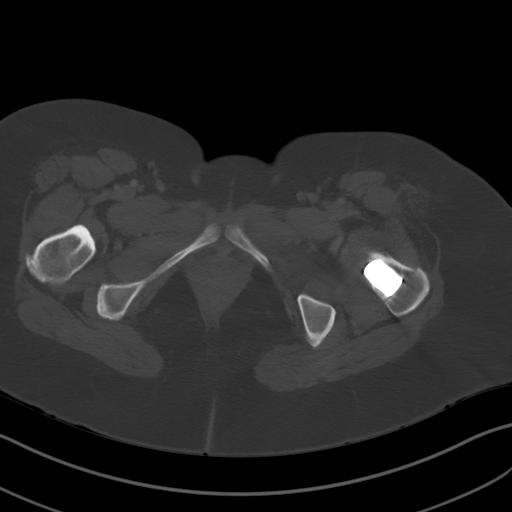
[im 15/91  soft-tissue]
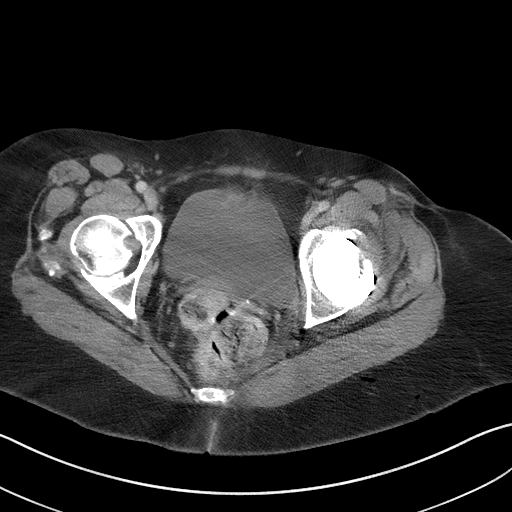
[im 19/91  soft-tissue]
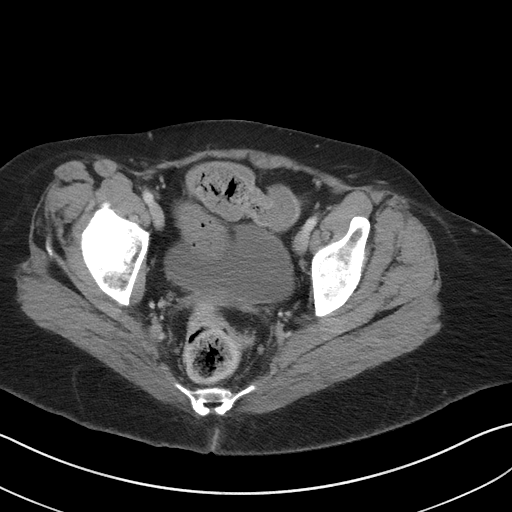
[im 29/91  soft-tissue]
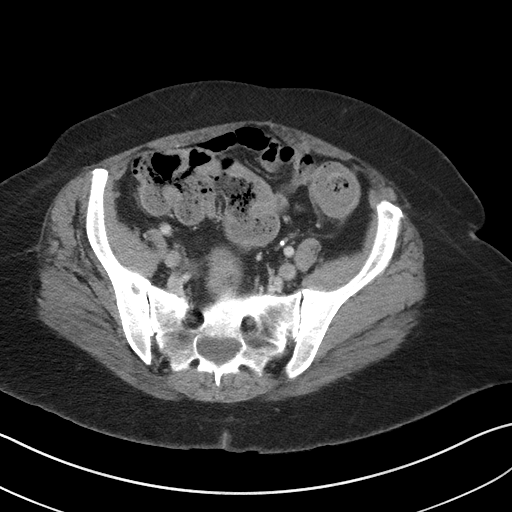
[im 34/91  soft-tissue]
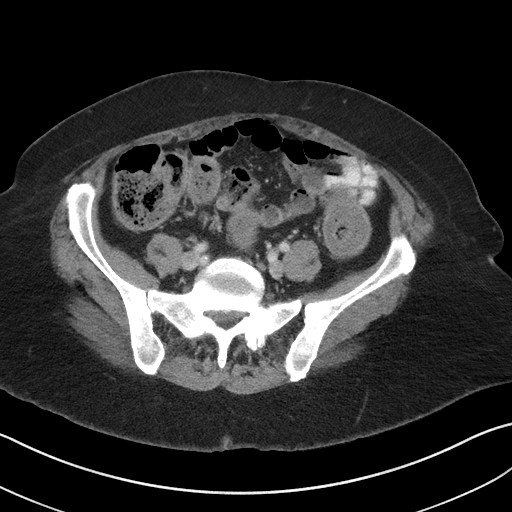
[im 43/91  soft-tissue]
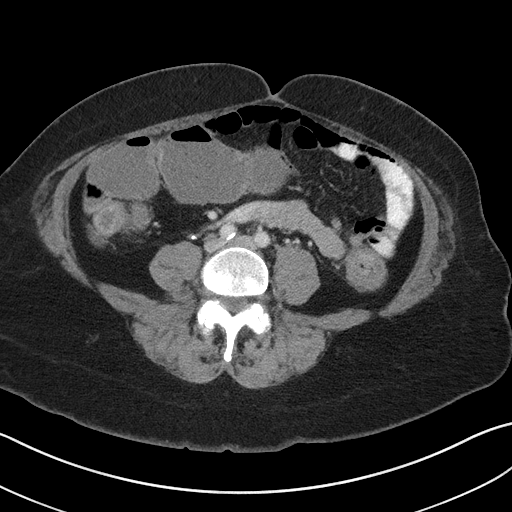
[im 48/91  soft-tissue]
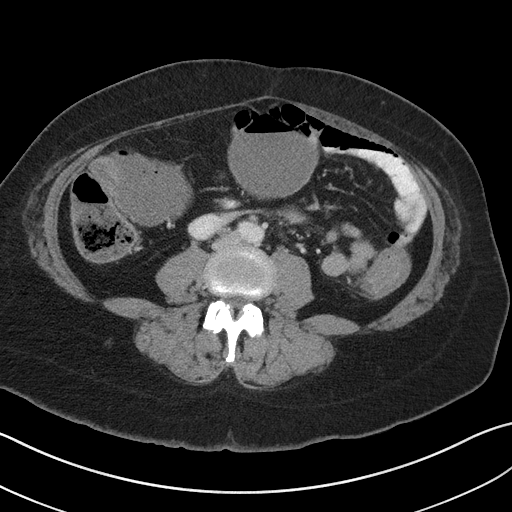
[im 57/91  soft-tissue]
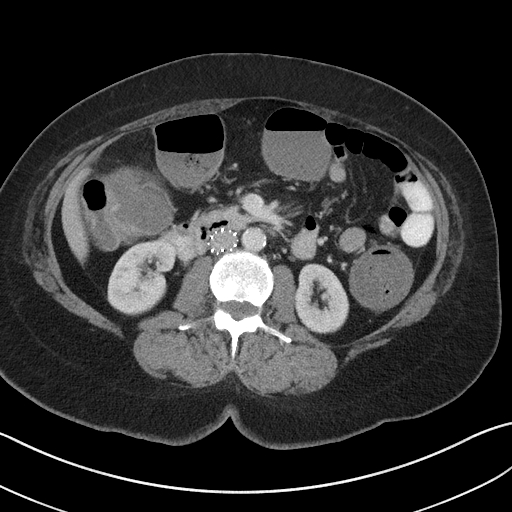
[im 62/91  soft-tissue]
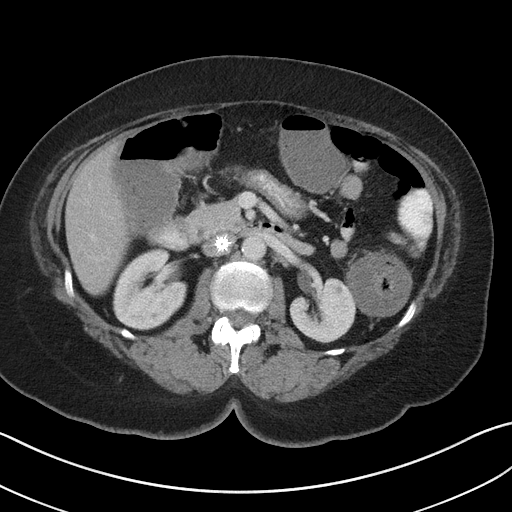
[im 62/91  bone]
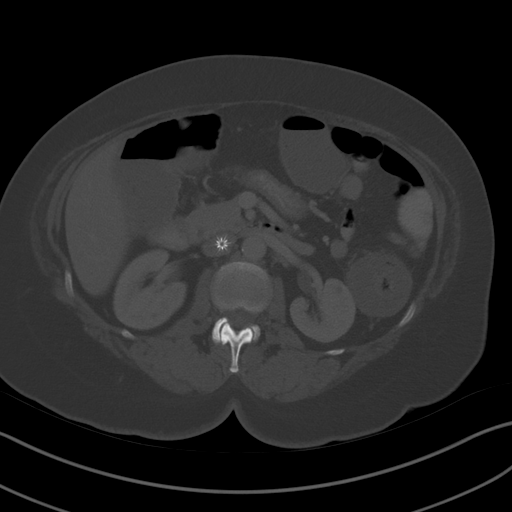
[im 72/91  soft-tissue]
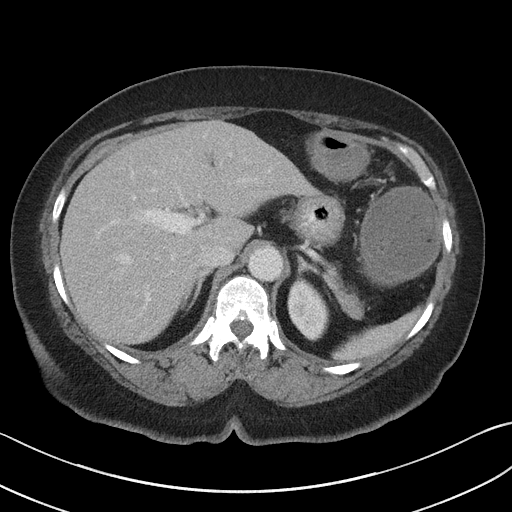
[im 76/91  soft-tissue]
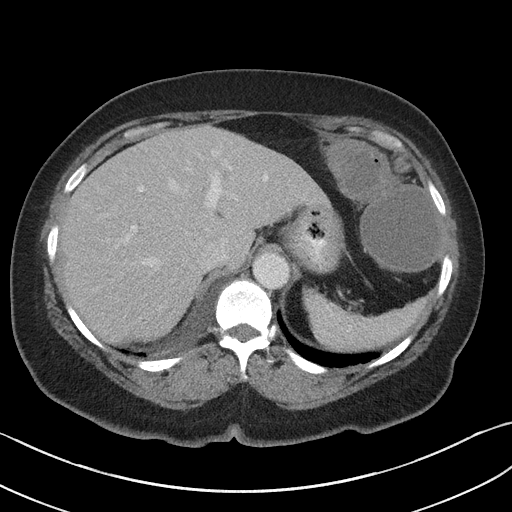
[im 86/91  soft-tissue]
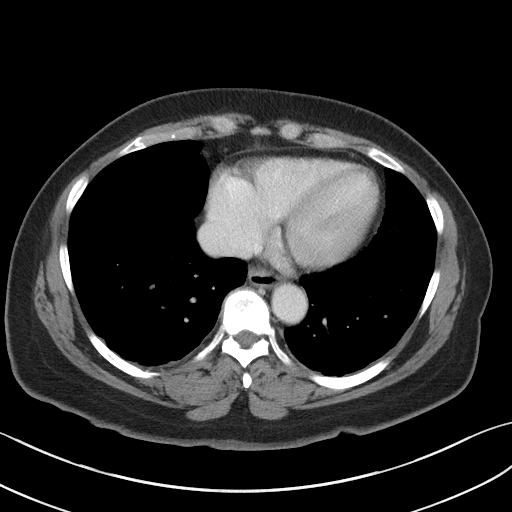

[Series 6: coronal st · coronal · 0.68mm/px · 3 of 93 slices shown]
[im 31/93  soft-tissue]
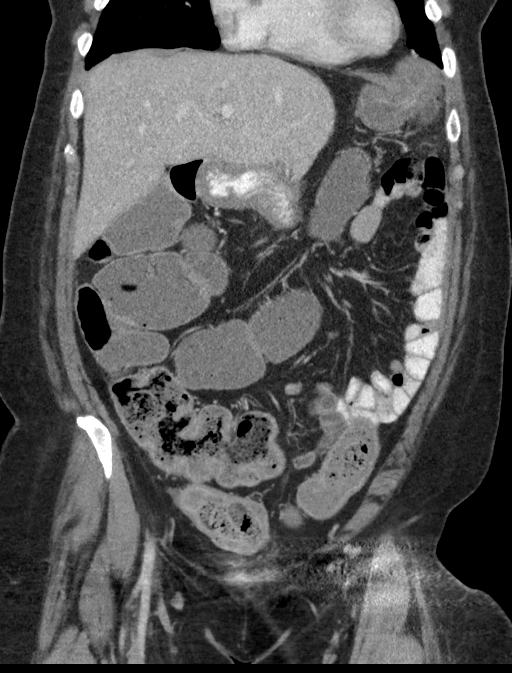
[im 41/93  soft-tissue]
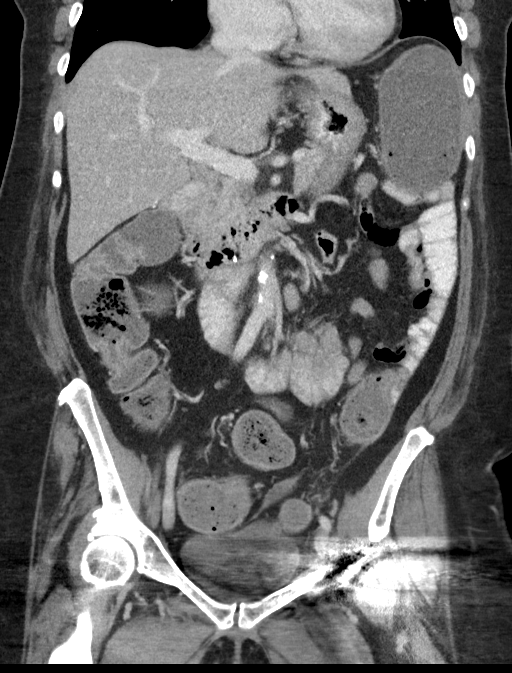
[im 52/93  soft-tissue]
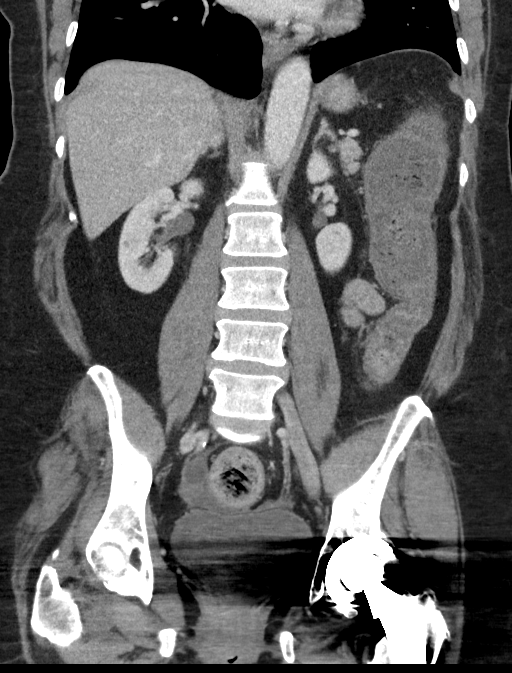

[15 of 46 positions shown; findings below may reference images not displayed]

FINDINGS: Lower chest:  The lung bases appear clear.

Hepatobiliary: Unremarkable appearance of the liver. Previous
cholecystectomy. No biliary dilatation.

Pancreas: No mass, inflammatory changes, or other significant
abnormality.

Spleen: Within normal limits in size and appearance.

Adrenals/Urinary Tract: Normal appearance of the adrenal glands. No
suspicious kidney abnormality. No hydronephrosis. The urinary
bladder appears normal.

Stomach/Bowel: Small hiatal hernia. The small bowel loops have a
normal course and caliber without evidence for obstruction. There is
a moderate amount of liquid and solid stool identified throughout
the colon up to the level of the rectum. No pathologic dilatation of
the colon identified. Mild wall thickening involving the descending
colon and sigmoid colon noted. No evidence for bowel perforation or
abscess. A moderate amount of desiccated stool is identified within
the rectum. This may reflect rectal impaction.

Vascular/Lymphatic: Normal appearance of the abdominal aorta.
Calcified atherosclerotic disease involves the abdominal aorta. No
aneurysm. Filter is identified within the inferior vena cava. No
upper abdominal or pelvic adenopathy identified.

Reproductive: No mass or other significant abnormality.

Other: None.

Musculoskeletal: Previous left hip arthroplasty. No acute bone
abnormalities noted.
IMPRESSION: 1. There is a moderate amount of liquid in solid stool throughout
the colon. There is a moderate amount of desiccated stool within the
rectum which may reflect rectal impaction. Additionally, there is
mild wall thickening involving the descending colon and sigmoid
colon which may reflect underlying mild colitis versus stercoral
colitis.
2. Aortic atherosclerosis.

## 2018-02-26 ENCOUNTER — Encounter: Payer: Self-pay | Admitting: Family Medicine

## 2018-02-26 ENCOUNTER — Telehealth: Payer: Self-pay | Admitting: Family Medicine

## 2018-02-26 NOTE — Telephone Encounter (Unsigned)
Copied from [place] [id number]. Topic: Quick Communication - See Telephone Encounter >> Feb 26, 2018  3:47 PM Percell Belt A wrote: CRM for notification. See Telephone encounter for: 02/26/18 -  pt called in and stated that she has a tooth that is abscessed.  She stated It has been like this for a couple of days and would like to know if Dr could call  in a antibiotic for her ?  She stated that she is also going to send a myh chart message    Pharmacy- [facility] [address]

## 2018-03-01 ENCOUNTER — Telehealth: Payer: Self-pay

## 2018-03-01 NOTE — Telephone Encounter (Signed)
Patient sent mychart message.

## 2018-03-01 NOTE — Telephone Encounter (Signed)
Noted.  Please see my other phone note.  She should still be evaluated by a dentist.

## 2018-03-01 NOTE — Telephone Encounter (Signed)
Please advise. It looks like she sent a mychart message as well.

## 2018-03-01 NOTE — Telephone Encounter (Signed)
Please call the patient and see how she is doing and what symptoms she has. This will help determine how quickly she needs to be seen. She will need to be evaluated for this.

## 2018-03-01 NOTE — Telephone Encounter (Signed)
Patient states she is not having as much pain, she rinsed her mouth with salt water and it has helped and she is not having any symptoms.informd patient to let us know if she starts having symptoms again

## 2018-03-01 NOTE — Telephone Encounter (Signed)
fyi

## 2018-03-01 NOTE — Telephone Encounter (Signed)
Copied from [place] [id number]. 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

## 2018-03-01 NOTE — Telephone Encounter (Signed)
She really needs to see a dentist first to determine if she needs to see an oral Teaching laboratory technician.  Please find out if she has a dentist and if so she should see them.  If not we could try to find one for her.

## 2018-03-02 NOTE — Telephone Encounter (Signed)
Thank you for helping her with this.

## 2018-03-02 NOTE — Telephone Encounter (Signed)
Patient notified, patient states she would like for [place] to find a dentist for her. I called several dental offices in the area, they all informed that it would be best for the patient to call the insurance and ask what dentist is in network with her insurance to ensure payment. I informed patient of this and she states she will call and set up dentist appointment as well.

## 2018-03-03 ENCOUNTER — Other Ambulatory Visit: Payer: Self-pay | Admitting: Family Medicine

## 2018-03-03 DIAGNOSIS — K224 Dyskinesia of esophagus: Secondary | ICD-10-CM

## 2018-03-03 NOTE — Telephone Encounter (Signed)
Last OV 10/23/17 last filled 02/01/18 60 0rf patient would like 90 day supply

## 2018-03-07 ENCOUNTER — Other Ambulatory Visit: Payer: Self-pay | Admitting: Family Medicine

## 2018-03-08 ENCOUNTER — Encounter: Payer: Self-pay | Admitting: Cardiovascular Disease

## 2018-03-08 ENCOUNTER — Ambulatory Visit (INDEPENDENT_AMBULATORY_CARE_PROVIDER_SITE_OTHER): Payer: Medicare HMO | Admitting: Cardiovascular Disease

## 2018-03-08 VITALS — BP 94/62 | HR 102 | Ht 66.0 in | Wt 211.2 lb

## 2018-03-08 DIAGNOSIS — I10 Essential (primary) hypertension: Secondary | ICD-10-CM | POA: Diagnosis not present

## 2018-03-08 DIAGNOSIS — I201 Angina pectoris with documented spasm: Secondary | ICD-10-CM

## 2018-03-08 DIAGNOSIS — E7800 Pure hypercholesterolemia, unspecified: Secondary | ICD-10-CM

## 2018-03-08 DIAGNOSIS — I5032 Chronic diastolic (congestive) heart failure: Secondary | ICD-10-CM

## 2018-03-08 DIAGNOSIS — Z760 Encounter for issue of repeat prescription: Secondary | ICD-10-CM | POA: Diagnosis not present

## 2018-03-08 MED ORDER — NITROGLYCERIN 0.4 MG SL SUBL: 0.4000 mg | SUBLINGUAL_TABLET | SUBLINGUAL | 6 refills | Status: DC | PRN

## 2018-03-08 MED ORDER — ISOSORBIDE MONONITRATE ER 30 MG PO TB24: ORAL_TABLET | ORAL | 6 refills | Status: DC

## 2018-03-08 MED ORDER — CARVEDILOL 3.125 MG PO TABS: 3.1250 mg | ORAL_TABLET | Freq: Two times a day (BID) | ORAL | 11 refills | Status: DC

## 2018-03-08 NOTE — Patient Instructions (Signed)
Medication Instructions:  Your physician recommends that you continue on your current medications as directed. Please refer to the Current Medication list given to you today. A prescription for nitroglycerin to use as needed has been sent to your pharmacy  Labwork: none  Testing/Procedures: none  Follow-Up: Your physician recommends that you schedule a follow-up appointment in: 12 months. Please call our office in about 8 months to schedule this appointment   Any Other Special Instructions Will Be Listed Below (If Applicable).     If you need a refill on your cardiac medications before your next appointment, please call your pharmacy.

## 2018-03-08 NOTE — Progress Notes (Signed)
Chief Complaint  Patient presents with  . Follow-up    diastoli cHF    History of Present Illness: 62 yo female with history of schizoaffective disorder, DM, HTN, diastolic CHF and multiple episodes of PE/DVT now on Eliquis with IVC filter who is here today for follow up. She was admitted to [facility] in April 2016 with chest pain. Her initial EKG was normal but she had dynamic ST changes when chest pain recurred so a code STEMI was called. Emergent cardiac cath with no evidence of CAD. Troponin was negative. She was started on Diltiazem and Imdur for possible vasospasm. Admitted to [facility] in January 2018 with chest pain. Nuclear stress test January 2018 with no ischemia. She was seen by primary care August 21, 2017 with chest pain.  Troponin and d-dimer were both negative.  Chest x-ray demonstrated trace bilateral effusions.  She was seen in the emergency room at [facility] 08/26/17 with chest pain.  Troponin was negative.  Chest x-ray demonstrated trace bilateral pleural effusions. Normal nuclear stress test in January 2019.   She is here today for follow up. The patient denies any dyspnea, palpitations, lower extremity edema, orthopnea, PND, dizziness, near syncope or syncope. She has rare chest pains. These only last for a few seconds. She has    Primary Care Physician: [name], MD   Past Medical History:  Diagnosis Date  . Acute right-sided low back pain with right-sided sciatica 10/23/2017  . Anemia   . Bilateral renal cysts   . CAD (coronary artery disease) 09/28/2016  . Chest pain 10/14/2015  . Chronic back pain    "upper and lower" (09/19/2014)  . Chronic shoulder pain (Location of Primary Source of Pain) (Left) 01/29/2016  . Colitis 04/08/2016  . Coronary vasospasm ([place]) 12/09/2014  . Depression    "major" (09/19/2014)  . DVT (deep venous thrombosis) ([place])   . Esophageal spasm   . Essential hypertension 10/28/2013  . Gallstones   . Gastritis   . Gastroenteritis   . GERD  (gastroesophageal reflux disease)   . Headache    "couple /month lately" (09/19/2014)  . Heart disease 12/09/2016  . High blood pressure   . High cholesterol   . History of blood transfusion 1985   related to hysterectomy  . History of nuclear stress test    Myoview 1/19:  EF 65, no ischemia or scar  . History of pulmonary embolus (PE) 09/19/2014  . 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 Rule out Paget's disease of the bone with blood work.   . Hyperlipidemia 10/28/2013  . Hypertension   . IBS (irritable bowel syndrome)   . Lumbar central spinal stenosis (moderate at L4-5; mild at L2-3 and L3-4) 01/29/2016  . Migraine    "a few times/yr" (09/19/2014)  . Myocardial infarction ([place]) 10/2010 X 3   "while hospitalized"  . Neck pain 01/29/2016  . Osteoarthritis    "qwhere; mostly around my joints" (09/19/2014)  . Pleural effusion, bilateral 07/31/2015  . Pneumonia 04/2014  . PTSD (post-traumatic stress disorder)   . Pulmonary embolism ([place]) 04/2001; 09/19/2014   "after gallbladder OR; "  . Schizoaffective disorder ([place])   . Schizoaffective disorder, depressive type ([place]) 08/28/2016  . Sleep apnea    "mild" (09/19/2014)  . Snoring    a. sleep study 5/16: No OSA  . SOB (shortness of breath) 07/31/2015  . Spinal stenosis   . Spondylosis   . Suicide attempt ([place]) 08/27/2016  . Type II  diabetes mellitus ([place])    "dx'd in 2007; lost weight; no RX for ~ 4 yr now" (09/19/2014)  . Wheezing 02/25/2017    Past Surgical History:  Procedure Laterality Date  . ABDOMINAL HYSTERECTOMY  1985  . ANTERIOR CERVICAL DECOMP/DISCECTOMY FUSION  10/2012   in [place] C5-C7   . APPENDECTOMY  1985  . BACK SURGERY    . BREAST CYST EXCISION Right   . BREAST EXCISIONAL BIOPSY Right YRS AGO   NEG  . CARDIAC CATHETERIZATION   2000's; 2009  . CHOLECYSTECTOMY  2002  . COLONOSCOPY WITH PROPOFOL N/A 12/12/2016   Procedure: COLONOSCOPY WITH PROPOFOL;  Surgeon: [name], MD;  [facility]  ENDOSCOPY;  Service: Endoscopy;  Laterality: N/A;  . COLONOSCOPY WITH PROPOFOL N/A 02/17/2017   Procedure: COLONOSCOPY WITH PROPOFOL;  Surgeon: [name], MD;  Location: [facility] ENDOSCOPY;  Service: Gastroenterology;  Laterality: N/A;  . ESOPHAGOGASTRODUODENOSCOPY (EGD) WITH PROPOFOL N/A 02/17/2017   Procedure: ESOPHAGOGASTRODUODENOSCOPY (EGD) WITH PROPOFOL;  Surgeon: [name], MD;  Location: [facility] ENDOSCOPY;  Service: Gastroenterology;  Laterality: N/A;  . EXCISION/RELEASE BURSA HIP Right 12/1984  . HERNIA REPAIR  1985  . LEFT HEART CATHETERIZATION WITH CORONARY ANGIOGRAM N/A 12/09/2014   Procedure: LEFT HEART CATHETERIZATION WITH CORONARY ANGIOGRAM;  Surgeon: Burnell Blanks, MD;  Location: [facility] CATH LAB;  Service: Cardiovascular;  Laterality: N/A;  . TONSILLECTOMY AND ADENOIDECTOMY  ~ 1968  . TOTAL HIP ARTHROPLASTY Left 04-06-2014  . UMBILICAL HERNIA REPAIR  1985  . VENA CAVA FILTER PLACEMENT  03/2014    Current Outpatient Medications  Medication Sig Dispense Refill  . carvedilol (COREG) 3.125 MG tablet Take 1 tablet (3.125 mg total) by mouth 2 (two) times daily with a meal. 60 tablet 11  . cyclobenzaprine (FLEXERIL) 10 MG tablet TAKE 1 TABLET BY MOUTH THREE TIMES A DAY AS NEEDED FOR MUSCLE SPASMS 90 tablet 0  . dicyclomine (BENTYL) 20 MG tablet TAKE 1 TABLET BY MOUTH 3 TIMES A DAY AS NEEDED FOR SPASMS 270 tablet 0  . diphenhydrAMINE (BENADRYL) 25 MG tablet Take 1 tablet (25 mg total) by mouth every 6 (six) hours. 12 tablet 0  . ELIQUIS 5 MG TABS tablet TAKE 1 TABLET (5 MG TOTAL) BY MOUTH 2 (TWO) TIMES DAILY. 60 tablet 5  . escitalopram (LEXAPRO) 20 MG tablet Take 1 tablet (20 mg total) by mouth daily. 90 tablet 1  . gabapentin (NEURONTIN) 600 MG tablet Take 1-1.5 tablets (600-900 mg total) by mouth 2 (two) times daily. Take 1 tablet in the AM and 1.5 tablet at bedtime 225 tablet 1  . isosorbide mononitrate (IMDUR) 30 MG 24 hr tablet TAKE 1.5 TABLETS (45 MG TOTAL) BY MOUTH DAILY. 45 tablet  6  . levothyroxine (SYNTHROID, LEVOTHROID) 25 MCG tablet Take 1 tablet (25 mcg total) by mouth daily before breakfast. Wait 2-3 hours before taking any other medication or supplement 90 tablet 0  . Multiple Vitamins-Minerals (SUPER THERA VITE M) TABS Take by mouth.    . OLANZapine (ZYPREXA) 10 MG tablet Take 1 tablet (10 mg total) by mouth at bedtime. 90 tablet 1  . ondansetron (ZOFRAN) 4 MG tablet Take 1 tablet (4 mg total) by mouth 2 (two) times daily as needed for nausea or vomiting. 30 tablet 1  . pantoprazole (PROTONIX) 40 MG tablet TAKE 1 TABLET BY MOUTH EVERY DAY 60 tablet 0  . pravastatin (PRAVACHOL) 10 MG tablet Take 1 tablet (10 mg total) by mouth at bedtime. 90 tablet 1  . PROCARDIA 10 MG capsule  TAKE 1 CAPSULE BY MOUTH 2 TIMES DAILY. TAKE 30 MINS BEFORE MEALS UP TO 1X PER DAY 180 capsule 1  . ramelteon (ROZEREM) 8 MG tablet Take 1 tablet (8 mg total) by mouth at bedtime. 90 tablet 1  . sitaGLIPtin (JANUVIA) 50 MG tablet Take 1 tablet (50 mg total) by mouth daily. 90 tablet 1  . neomycin-polymyxin b-dexamethasone (MAXITROL) 3.5-10000-0.1 OINT APPLY A SMALL AMOUNT IN RIGHT EYE 2 TIMES A DAY FOR 14 DAYS  1  . nitroGLYCERIN (NITROSTAT) 0.4 MG SL tablet Place 1 tablet (0.4 mg total) under the tongue every 5 (five) minutes as needed for chest pain. 25 tablet 6   No current facility-administered medications for this visit.     Allergies  Allergen Reactions  . Abilify [Aripiprazole] Other (See Comments)    Jerking movement, slow motor skills  . Augmentin [Amoxicillin-Pot Clavulanate] Diarrhea and Nausea And Vomiting  . Bactrim [Sulfamethoxazole-Trimethoprim] Diarrhea  . Ceftin [Cefuroxime Axetil] Diarrhea  . Coconut Oil     Rash   . Diclofenac Other (See Comments)     Muscle Pain  . Dye Fdc Red [Red Dye]     "hair dye"  . Indocin [Indomethacin] Other (See Comments)    Migraines   . Linaclotide Diarrhea  . Lisinopril Diarrhea and Other (See Comments)    Other reaction(s):  Dizziness, Vomiting  . Morphine Other (See Comments)    Chest Tightness  . Morphine And Related Other (See Comments)    Chest tightness   . Simvastatin Other (See Comments)    Muscle Pain  . Sulfa Antibiotics Diarrhea  . Sulfamethoxazole-Trimethoprim Diarrhea  . Wellbutrin [Bupropion] Other (See Comments)    Jerking movement Jerking movement Slurred speech  . Zetia [Ezetimibe]     Diarrhea   . Oxycodone-Acetaminophen Itching  . Quetiapine Fumarate Palpitations  . Quetiapine Fumarate Palpitations    Social History   Socioeconomic History  . Marital status: Divorced    Spouse name: Not on file  . Number of children: Not on file  . Years of education: Not on file  . Highest education level: Not on file  Occupational History  . Not on file  Social Needs  . Financial resource strain: Not hard at all  . Food insecurity:    Worry: Never true    Inability: Never true  . Transportation needs:    Medical: No    Non-medical: No  Tobacco Use  . Smoking status: Former Smoker    Packs/day: 1.50    Years: 10.00    Pack years: 15.00    Types: Cigarettes    Last attempt to quit: 04/12/1979    Years since quitting: 38.9  . Smokeless tobacco: Never Used  Substance and Sexual Activity  . Alcohol use: No    Alcohol/week: 0.0 oz  . Drug use: No  . Sexual activity: Not Currently  Lifestyle  . Physical activity:    Days per week: 0 days    Minutes per session: Not on file  . Stress: Not on file  Relationships  . Social connections:    Talks on phone: Not on file    Gets together: Not on file    Attends religious service: Not on file    Active member of club or organization: Not on file    Attends meetings of clubs or organizations: Not on file    Relationship status: Not on file  . Intimate partner violence:    Fear of current or ex partner: No  Emotionally abused: No    Physically abused: No    Forced sexual activity: No  Other Topics Concern  . Not on file  Social  History Narrative   Lives with fiancee in an apartment on the first floor.  Has 3 grown children.  One child passed away.  On disability 1994-12-15 - 2000, 2007 - current.     Education: some college.    Family History  Problem Relation Age of Onset  . Stroke Mother        Deceased  . Lung cancer Father        Deceased  . Healthy Daughter   . Healthy Son        x 2  . Other Son        Suicide  . Cancer Brother   . Heart attack Neg Hx     Review of Systems:  As stated in the HPI and otherwise negative.   BP 94/62   Pulse (!) 102   Ht 5\' 6"  (1.676 m)   Wt 211 lb 3.2 oz (95.8 kg)   SpO2 95%   BMI 34.09 kg/m   Physical Examination:  General: Well developed, well nourished, NAD  HEENT: OP clear, mucus membranes moist  SKIN: warm, dry. No rashes. Neuro: No focal deficits  Musculoskeletal: Muscle strength 5/5 all ext  Psychiatric: Mood and affect normal  Neck: No JVD, no carotid bruits, no thyromegaly, no lymphadenopathy.  Lungs:Clear bilaterally, no wheezes, rhonci, crackles Cardiovascular: Regular rate and rhythm. No murmurs, gallops or rubs. Abdomen:Soft. Bowel sounds present. Non-tender.  Extremities: No lower extremity edema. Pulses are 2 + in the bilateral DP/PT.   EKG:  EKG is not ordered today. The ekg ordered today demonstrates   Recent Labs: 08/21/2017: BUN 8; Creatinine, Ser 0.96; Hemoglobin 12.8; Platelets 416.0; Potassium 3.7; Sodium 139 09/09/2017: NT-Pro BNP 78 11/05/2017: TSH 1.95 11/16/2017: ALT 36   Lipid Panel    Component Value Date/Time   CHOL 304 (H) 08/21/2017 1118   TRIG 190.0 (H) 08/21/2017 1118   HDL 38.70 (L) 08/21/2017 1118   CHOLHDL 8 08/21/2017 1118   VLDL 38.0 08/21/2017 1118   LDLCALC 227 (H) 08/21/2017 1118     Wt Readings from Last 3 Encounters:  03/08/18 211 lb 3.2 oz (95.8 kg)  01/22/18 214 lb 6.4 oz (97.3 kg)  12/14/17 218 lb 3.2 oz (99 kg)     Other studies Reviewed: Additional studies/ records that were reviewed today  include: . Review of the above records demonstrates:    Assessment and Plan:   1. Coronary vasospasm: Rare chest pains. No CAD by cath in April 2016. Possible coronary vasospasm. Normal stress test in January 2019. Will continue beta blocker and Imdur.   I will give her SL NTG to use prn today.   2. HTN: BP well controlled. No changes.   3. HLD: Managed by primary care. She is on a statin.   4. History of Pulmonary embolism: This is managed by primary care. Continue Eliquis.   5. Chronic diastolic CHF: Stable weight. Continue Lasix as needed.   Current medicines are reviewed at length with the patient today.  The patient does not have concerns regarding medicines.  The following changes have been made:  no change  Labs/ tests ordered today include:  No orders of the defined types were placed in this encounter.   Disposition:   FU with me in 12 months.   Signed, Lauree Chandler, MD 03/08/2018 3:02 PM  [facility] [address], [place], [place]  [address] Phone: [phone]; Fax: [phone]

## 2018-03-09 NOTE — Telephone Encounter (Signed)
Last Ov 10/23/17 last filled 02/04/18 90 0rf

## 2018-03-09 NOTE — Telephone Encounter (Signed)
Refill sent to pharmacy.  Patient needs a follow-up visit scheduled with me.  Thanks.

## 2018-03-15 ENCOUNTER — Emergency Department
Admission: EM | Admit: 2018-03-15 | Discharge: 2018-03-15 | Disposition: A | Discharge status: Home / Self Care | Payer: Medicare HMO | Attending: Emergency Medicine | Admitting: Emergency Medicine

## 2018-03-15 ENCOUNTER — Encounter: Payer: Self-pay | Admitting: Gastroenterology

## 2018-03-15 ENCOUNTER — Telehealth: Payer: Self-pay

## 2018-03-15 ENCOUNTER — Ambulatory Visit (INDEPENDENT_AMBULATORY_CARE_PROVIDER_SITE_OTHER): Payer: Medicare HMO | Admitting: Gastroenterology

## 2018-03-15 ENCOUNTER — Other Ambulatory Visit: Payer: Self-pay

## 2018-03-15 VITALS — BP 93/60 | HR 82 | Ht 66.0 in | Wt 207.2 lb

## 2018-03-15 DIAGNOSIS — F419 Anxiety disorder, unspecified: Secondary | ICD-10-CM | POA: Diagnosis not present

## 2018-03-15 DIAGNOSIS — Z96642 Presence of left artificial hip joint: Secondary | ICD-10-CM | POA: Insufficient documentation

## 2018-03-15 DIAGNOSIS — Z7984 Long term (current) use of oral hypoglycemic drugs: Secondary | ICD-10-CM | POA: Insufficient documentation

## 2018-03-15 DIAGNOSIS — Z87891 Personal history of nicotine dependence: Secondary | ICD-10-CM | POA: Diagnosis not present

## 2018-03-15 DIAGNOSIS — E860 Dehydration: Secondary | ICD-10-CM

## 2018-03-15 DIAGNOSIS — I959 Hypotension, unspecified: Secondary | ICD-10-CM | POA: Diagnosis not present

## 2018-03-15 DIAGNOSIS — Z9049 Acquired absence of other specified parts of digestive tract: Secondary | ICD-10-CM | POA: Insufficient documentation

## 2018-03-15 DIAGNOSIS — R748 Abnormal levels of other serum enzymes: Secondary | ICD-10-CM | POA: Diagnosis not present

## 2018-03-15 DIAGNOSIS — F329 Major depressive disorder, single episode, unspecified: Secondary | ICD-10-CM | POA: Diagnosis not present

## 2018-03-15 DIAGNOSIS — Z79899 Other long term (current) drug therapy: Secondary | ICD-10-CM | POA: Diagnosis not present

## 2018-03-15 DIAGNOSIS — F259 Schizoaffective disorder, unspecified: Secondary | ICD-10-CM | POA: Diagnosis not present

## 2018-03-15 DIAGNOSIS — I252 Old myocardial infarction: Secondary | ICD-10-CM | POA: Diagnosis not present

## 2018-03-15 DIAGNOSIS — R42 Dizziness and giddiness: Secondary | ICD-10-CM | POA: Diagnosis not present

## 2018-03-15 DIAGNOSIS — Z7901 Long term (current) use of anticoagulants: Secondary | ICD-10-CM | POA: Diagnosis not present

## 2018-03-15 DIAGNOSIS — I10 Essential (primary) hypertension: Secondary | ICD-10-CM | POA: Insufficient documentation

## 2018-03-15 DIAGNOSIS — E119 Type 2 diabetes mellitus without complications: Secondary | ICD-10-CM | POA: Insufficient documentation

## 2018-03-15 DIAGNOSIS — R69 Illness, unspecified: Secondary | ICD-10-CM | POA: Diagnosis not present

## 2018-03-15 DIAGNOSIS — I2510 Atherosclerotic heart disease of native coronary artery without angina pectoris: Secondary | ICD-10-CM | POA: Diagnosis not present

## 2018-03-15 LAB — CBC WITH DIFFERENTIAL/PLATELET
Basophils Absolute: 0 10*3/uL (ref 0–0.1)
Basophils Relative: 0 %
Eosinophils Absolute: 0.2 10*3/uL (ref 0–0.7)
Eosinophils Relative: 3 %
HCT: 34.4 % — ABNORMAL LOW (ref 35.0–47.0)
Hemoglobin: 11.5 g/dL — ABNORMAL LOW (ref 12.0–16.0)
Lymphocytes Relative: 26 %
Lymphs Abs: 1.9 10*3/uL (ref 1.0–3.6)
MCH: 30.4 pg (ref 26.0–34.0)
MCHC: 33.5 g/dL (ref 32.0–36.0)
MCV: 90.8 fL (ref 80.0–100.0)
Monocytes Absolute: 0.4 10*3/uL (ref 0.2–0.9)
Monocytes Relative: 6 %
Neutro Abs: 4.8 10*3/uL (ref 1.4–6.5)
Neutrophils Relative %: 65 %
Platelets: 355 10*3/uL (ref 150–440)
RBC: 3.79 MIL/uL — ABNORMAL LOW (ref 3.80–5.20)
RDW: 14.5 % (ref 11.5–14.5)
WBC: 7.3 10*3/uL (ref 3.6–11.0)

## 2018-03-15 LAB — TROPONIN I
Troponin I: <0.03 ng/mL (ref <0.03)
Troponin I: <0.03 ng/mL (ref <0.03)

## 2018-03-15 LAB — URINALYSIS, COMPLETE (UACMP) WITH MICROSCOPIC
Bilirubin Urine: NEGATIVE
Glucose, UA: NEGATIVE mg/dL
Hgb urine dipstick: NEGATIVE
Ketones, ur: NEGATIVE mg/dL
Leukocytes, UA: NEGATIVE
Nitrite: NEGATIVE
Protein, ur: NEGATIVE mg/dL
Specific Gravity, Urine: 1.004 — ABNORMAL LOW (ref 1.005–1.030)
pH: 6 (ref 5.0–8.0)

## 2018-03-15 LAB — COMPREHENSIVE METABOLIC PANEL
ALT: 23 U/L (ref 0–44)
AST: 38 U/L (ref 15–41)
Albumin: 3.6 g/dL (ref 3.5–5.0)
Alkaline Phosphatase: 96 U/L (ref 38–126)
Anion gap: 10 (ref 5–15)
BUN: 10 mg/dL (ref 8–23)
CO2: 23 mmol/L (ref 22–32)
Calcium: 8.7 mg/dL — ABNORMAL LOW (ref 8.9–10.3)
Chloride: 107 mmol/L (ref 98–111)
Creatinine, Ser: 1.08 mg/dL — ABNORMAL HIGH (ref 0.44–1.00)
GFR calc Af Amer: >60 mL/min (ref >60)
GFR calc non Af Amer: 53 mL/min — ABNORMAL LOW (ref >60)
Glucose, Bld: 126 mg/dL — ABNORMAL HIGH (ref 70–99)
Potassium: 3.8 mmol/L (ref 3.5–5.1)
Sodium: 140 mmol/L (ref 135–145)
Total Bilirubin: 0.7 mg/dL (ref 0.3–1.2)
Total Protein: 7 g/dL (ref 6.5–8.1)

## 2018-03-15 MED ORDER — SODIUM CHLORIDE 0.9 % IV BOLUS
1000.0000 mL | Freq: Once | INTRAVENOUS | Status: AC
  Administered 2018-03-15: 1000 mL via INTRAVENOUS

## 2018-03-15 NOTE — ED Provider Notes (Signed)
-----------------------------------------   6:09 PM on 03/15/2018 -----------------------------------------  Patient care assumed from Dr. Quentin Cornwall.  Patient's lab work has resulted largely normal.  Repeat troponin remains negative.  Blood pressure remains normo-to hypertensive.  Patient states she is feeling much better.  We will discharge the patient home with PCP follow-up.   Harvest Dark, MD 03/15/18 [phone]

## 2018-03-15 NOTE — ED Notes (Signed)

## 2018-03-15 NOTE — Telephone Encounter (Signed)
Copied from [place] [phone]. Topic: Quick Communication - See Telephone Encounter >> Mar 15, 2018  1:25 PM Conception Chancy, NT wrote: CRM for notification. See Telephone encounter for: 03/15/18.  Sharyn Lull is calling from [facility] and states the patient is in the office today seeing Dr. Jonni Sanger. Dr. Jonni Sanger wanted Dr. Caryl Bis to be aware that she is having severe hypertension and dizziness and would like for someone to follow up with he patient from the office. Please advise.

## 2018-03-15 NOTE — Telephone Encounter (Signed)
Noted.  Patient is still in the ED.

## 2018-03-15 NOTE — Telephone Encounter (Signed)
Contacted Dr. Ellen Henri office regarding patients current hypotensive blood pressure readings, and dizziness.  Informed Lovena Le (receptionist) to inform Dr. Caryl Bis of this and to contact patient for an appt to discuss bp.  Patient is being monitored in office until blood pressure becomes more stable and dizziness is gone.  Thanks [employer]

## 2018-03-15 NOTE — ED Triage Notes (Signed)
Pt sent from gastroenterology office for hypotension (71/46) today. Pt was there for f/u after colonoscopy last month. +dizziness over last couple days. Pt reports decreased oral intake recently. Hx HTN and on meds, pt states "I think my BP meds may be making this happen." Denies CP/SOB or N/V/D. Denies syncope. Hx DM. Reports BP was low last week at cardiologist office.

## 2018-03-15 NOTE — Telephone Encounter (Signed)
Fyi. Patient is currently in ER

## 2018-03-15 NOTE — ED Provider Notes (Signed)
[facility] Emergency Department Provider Note    First MD Initiated Contact with Patient 03/15/18 1426     (approximate)  I have reviewed the triage vital signs and the nursing notes.   HISTORY  Chief Complaint Hypotension and Dizziness    HPI Valerie Vazquez is a 62 y.o. female history of orthostatic hypertension presents to the ER today from GI clinic.  Had recent colonoscopy last month.  In clinic today she is found to be hypotensive complaining of lightheadedness and dizziness.  Her medications.  No fevers.  No nausea or vomiting.  No numbness or tingling.  On arrival to the ER she was starting to develop some midepigastric midsternal chest discomfort.  States she does have a history of MI but this feels different.  States it feels similar to her esophageal spasms.    Past Medical History:  Diagnosis Date  . Acute right-sided low back pain with right-sided sciatica 10/23/2017  . Anemia   . Bilateral renal cysts   . CAD (coronary artery disease) 09/28/2016  . Chest pain 10/14/2015  . Chronic back pain    "upper and lower" (09/19/2014)  . Chronic shoulder pain (Location of Primary Source of Pain) (Left) 01/29/2016  . Colitis 04/08/2016  . Coronary vasospasm ([place]) 12/09/2014  . Depression    "major" (09/19/2014)  . DVT (deep venous thrombosis) ([place])   . Esophageal spasm   . Essential hypertension 10/28/2013  . Gallstones   . Gastritis   . Gastroenteritis   . GERD (gastroesophageal reflux disease)   . Headache    "couple /month lately" (09/19/2014)  . Heart disease 12/09/2016  . High blood pressure   . High cholesterol   . History of blood transfusion 1985   related to hysterectomy  . History of nuclear stress test    Myoview 1/19:  EF 65, no ischemia or scar  . History of pulmonary embolus (PE) 09/19/2014  . 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 Rule out Paget's disease of the bone with blood work.   . Hyperlipidemia  10/28/2013  . Hypertension   . IBS (irritable bowel syndrome)   . Lumbar central spinal stenosis (moderate at L4-5; mild at L2-3 and L3-4) 01/29/2016  . Migraine    "a few times/yr" (09/19/2014)  . Myocardial infarction ([place]) 10/2010 X 3   "while hospitalized"  . Neck pain 01/29/2016  . Osteoarthritis    "qwhere; mostly around my joints" (09/19/2014)  . Pleural effusion, bilateral 07/31/2015  . Pneumonia 04/2014  . PTSD (post-traumatic stress disorder)   . Pulmonary embolism ([place]) 04/2001; 09/19/2014   "after gallbladder OR; "  . Schizoaffective disorder ([place])   . Schizoaffective disorder, depressive type ([place]) 08/28/2016  . Sleep apnea    "mild" (09/19/2014)  . Snoring    a. sleep study 5/16: No OSA  . SOB (shortness of breath) 07/31/2015  . Spinal stenosis   . Spondylosis   . Suicide attempt ([place]) 08/27/2016  . Type II diabetes mellitus ([place])    "dx'd in 2007; lost weight; no RX for ~ 4 yr now" (09/19/2014)  . Wheezing 02/25/2017   Family History  Problem Relation Age of Onset  . Stroke Mother        Deceased  . Lung cancer Father        Deceased  . Healthy Daughter   . Healthy Son        x 2  . Other Son  Suicide  . Cancer Brother   . Heart attack Neg Hx    Past Surgical History:  Procedure Laterality Date  . ABDOMINAL HYSTERECTOMY  1985  . ANTERIOR CERVICAL DECOMP/DISCECTOMY FUSION  10/2012   in [place] C5-C7   . APPENDECTOMY  1985  . BACK SURGERY    . BREAST CYST EXCISION Right   . BREAST EXCISIONAL BIOPSY Right YRS AGO   NEG  . CARDIAC CATHETERIZATION   2000's; 2009  . CHOLECYSTECTOMY  2002  . COLONOSCOPY WITH PROPOFOL N/A 12/12/2016   Procedure: COLONOSCOPY WITH PROPOFOL;  Surgeon: [name], MD;  Location: [facility] ENDOSCOPY;  Service: Endoscopy;  Laterality: N/A;  . COLONOSCOPY WITH PROPOFOL N/A 02/17/2017   Procedure: COLONOSCOPY WITH PROPOFOL;  Surgeon: [name], MD;  Location: [facility] ENDOSCOPY;  Service: Gastroenterology;  Laterality: N/A;  .  ESOPHAGOGASTRODUODENOSCOPY (EGD) WITH PROPOFOL N/A 02/17/2017   Procedure: ESOPHAGOGASTRODUODENOSCOPY (EGD) WITH PROPOFOL;  Surgeon: [name], MD;  Location: [facility] ENDOSCOPY;  Service: Gastroenterology;  Laterality: N/A;  . EXCISION/RELEASE BURSA HIP Right 12/1984  . HERNIA REPAIR  1985  . LEFT HEART CATHETERIZATION WITH CORONARY ANGIOGRAM N/A 12/09/2014   Procedure: LEFT HEART CATHETERIZATION WITH CORONARY ANGIOGRAM;  Surgeon: Burnell Blanks, MD;  Location: [facility] CATH LAB;  Service: Cardiovascular;  Laterality: N/A;  . TONSILLECTOMY AND ADENOIDECTOMY  ~ 1968  . TOTAL HIP ARTHROPLASTY Left 04-06-2014  . UMBILICAL HERNIA REPAIR  1985  . VENA CAVA FILTER PLACEMENT  03/2014   Patient Active Problem List   Diagnosis Date Noted  . Boil 10/27/2017  . Urinary incontinence 10/27/2017  . Elevated alkaline phosphatase level 10/27/2017  . Acute right-sided low back pain with right-sided sciatica 10/23/2017  . Subclinical hypothyroidism 08/26/2017  . Soft tissue swelling 05/29/2017  . Diabetes mellitus without complication ([place]) 53/97/6734  . Acute right eye pain 03/25/2017  . Elevated glucose 02/25/2017  . Bruise 02/25/2017  . Wheezing 02/25/2017  . Paresthesia 02/06/2017  . Falls 01/30/2017  . Colon polyp 01/30/2017  . RUQ pain 01/12/2017  . Heart disease 12/09/2016  . Plantar fasciitis of left foot 11/07/2016  . Costochondritis 10/06/2016  . BRBPR (bright red blood per rectum) 10/06/2016  . CAD (coronary artery disease) 09/28/2016  . Diarrhea 09/16/2016  . Epigastric discomfort 09/16/2016  . Schizoaffective disorder, depressive type ([place]) 08/28/2016  . Suicide attempt ([place]) 08/27/2016  . Colitis 04/08/2016  . Absolute anemia 01/29/2016  . Neck pain 01/29/2016  . Chronic shoulder pain (Location of Primary Source of Pain) (Left) 01/29/2016  . Osteoarthritis of hip (Bilateral) 01/29/2016  . Lumbar central spinal stenosis (moderate at L4-5; mild at L2-3 and L3-4) 01/29/2016  . Hx of  cervical spine surgery 01/29/2016  . Numbness 11/19/2015  . Breast pain 10/19/2015  . Chest pain 10/14/2015  . Nausea and vomiting 08/15/2015  . SOB (shortness of breath) 07/31/2015  . Pleural effusion, bilateral 07/31/2015  . Esophageal spasm 06/05/2015  . History of DVT (deep vein thrombosis) 02/25/2015  . 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 Constipation 12/12/2014  . Coronary vasospasm ([place]) 12/09/2014  . History of pulmonary embolus (PE) 09/19/2014  . Anxiety and depression 04/13/2014  . IBS (irritable bowel syndrome) 04/11/2014  . GERD (gastroesophageal reflux disease) 04/11/2014  . Insomnia 04/11/2014  . Essential hypertension 10/28/2013  . Anemia 10/28/2013  . Hyperlipidemia 10/28/2013      Prior to Admission medications   Medication Sig Start Date End Date Taking? Authorizing Provider  carvedilol (COREG) 3.125 MG tablet Take 1 tablet (3.125 mg total) by mouth 2 (  two) times daily with a meal. 03/08/18   Burnell Blanks, MD  cyclobenzaprine (FLEXERIL) 10 MG tablet TAKE 1 TABLET BY MOUTH THREE TIMES A DAY AS NEEDED FOR MUSCLE SPASMS 03/09/18   [name], MD  dicyclomine (BENTYL) 20 MG tablet TAKE 1 TABLET BY MOUTH 3 TIMES A DAY AS NEEDED FOR SPASMS 08/28/17   [name], MD  diphenhydrAMINE (BENADRYL) 25 MG tablet Take 1 tablet (25 mg total) by mouth every 6 (six) hours. 09/12/16   Jola Schmidt, MD  ELIQUIS 5 MG TABS tablet TAKE 1 TABLET (5 MG TOTAL) BY MOUTH 2 (TWO) TIMES DAILY. 10/02/17   [name], MD  escitalopram (LEXAPRO) 20 MG tablet Take 1 tablet (20 mg total) by mouth daily. 12/08/17   Ursula Alert, MD  gabapentin (NEURONTIN) 600 MG tablet Take 1-1.5 tablets (600-900 mg total) by mouth 2 (two) times daily. Take 1 tablet in the AM and 1.5 tablet at bedtime 12/08/17   Ursula Alert, MD  isosorbide mononitrate (IMDUR) 30 MG 24 hr tablet TAKE 1.5 TABLETS (45 MG TOTAL) BY MOUTH DAILY. 03/08/18   Burnell Blanks, MD  levothyroxine (SYNTHROID,  LEVOTHROID) 25 MCG tablet Take 1 tablet (25 mcg total) by mouth daily before breakfast. Wait 2-3 hours before taking any other medication or supplement 12/24/17   [name], MD  Multiple Vitamins-Minerals (SUPER THERA VITE M) TABS Take by mouth.    [provider]  neomycin-polymyxin b-dexamethasone (MAXITROL) 3.5-10000-0.1 OINT APPLY A SMALL AMOUNT IN RIGHT EYE 2 TIMES A DAY FOR 14 DAYS 03/25/17   [provider]  nitroGLYCERIN (NITROSTAT) 0.4 MG SL tablet Place 1 tablet (0.4 mg total) under the tongue every 5 (five) minutes as needed for chest pain. 03/08/18 06/06/18  Burnell Blanks, MD  OLANZapine (ZYPREXA) 10 MG tablet Take 1 tablet (10 mg total) by mouth at bedtime. 12/08/17   Ursula Alert, MD  ondansetron (ZOFRAN) 4 MG tablet Take 1 tablet (4 mg total) by mouth 2 (two) times daily as needed for nausea or vomiting. 09/18/17   McLean-Scocuzza, Nino Glow, MD  pantoprazole (PROTONIX) 40 MG tablet TAKE 1 TABLET BY MOUTH EVERY DAY 02/04/18   [name], MD  pravastatin (PRAVACHOL) 10 MG tablet Take 1 tablet (10 mg total) by mouth at bedtime. 09/18/17   McLean-Scocuzza, Nino Glow, MD  PROCARDIA 10 MG capsule TAKE 1 CAPSULE BY MOUTH 2 TIMES DAILY. TAKE 30 MINS BEFORE MEALS UP TO 1X PER DAY 03/03/18   [name], MD  ramelteon (ROZEREM) 8 MG tablet Take 1 tablet (8 mg total) by mouth at bedtime. 11/18/17   Ursula Alert, MD  sitaGLIPtin (JANUVIA) 50 MG tablet Take 1 tablet (50 mg total) by mouth daily. 01/06/18   [name], MD    Allergies Abilify [aripiprazole]; Augmentin [amoxicillin-pot clavulanate]; Bactrim [sulfamethoxazole-trimethoprim]; Ceftin [cefuroxime axetil]; Coconut oil; Diclofenac; Dye fdc red [red dye]; Indocin [indomethacin]; Linaclotide; Lisinopril; Morphine; Morphine and related; Simvastatin; Sulfa antibiotics; Sulfamethoxazole-trimethoprim; Wellbutrin [bupropion]; Zetia [ezetimibe]; Oxycodone-acetaminophen; Quetiapine fumarate; and  Quetiapine fumarate    Social History Social History   Tobacco Use  . Smoking status: Former Smoker    Packs/day: 1.50    Years: 10.00    Pack years: 15.00    Types: Cigarettes    Last attempt to quit: 04/12/1979    Years since quitting: 38.9  . Smokeless tobacco: Never Used  Substance Use Topics  . Alcohol use: No    Alcohol/week: 0.0 oz  . Drug use:  No    Review of Systems Patient denies headaches, rhinorrhea, blurry vision, numbness, shortness of breath, chest pain, edema, cough, abdominal pain, nausea, vomiting, diarrhea, dysuria, fevers, rashes or hallucinations unless otherwise stated above in HPI. ____________________________________________   PHYSICAL EXAM:  VITAL SIGNS: Vitals:   03/15/18 1447  BP: 115/72  Pulse: 73  Resp: 16  Temp: 97.6 F (36.4 C)  SpO2: 94%    Constitutional: Alert and oriented.  Eyes: Conjunctivae are normal.  Head: Atraumatic. Nose: No congestion/rhinnorhea. Mouth/Throat: Mucous membranes are moist.   Neck: No stridor. Painless ROM.  Cardiovascular: Normal rate, regular rhythm. Grossly normal heart sounds.  Good peripheral circulation. Respiratory: Normal respiratory effort.  No retractions. Lungs CTAB. Gastrointestinal: Soft and nontender. No distention. No abdominal bruits. No CVA tenderness. Genitourinary: deferred Musculoskeletal: No lower extremity tenderness nor edema.  No joint effusions. Neurologic:  Normal speech and language. No gross focal neurologic deficits are appreciated. No facial droop Skin:  Skin is warm, dry and intact. No rash noted. Psychiatric: Mood and affect are normal. Speech and behavior are normal.  ____________________________________________   LABS (all labs ordered are listed, but only abnormal results are displayed)  Results for orders placed or performed during the hospital encounter of 03/15/18 (from the past 24 hour(s))  CBC with Differential/Platelet     Status: Abnormal   Collection Time:  03/15/18  2:32 PM  Result Value Ref Range   WBC 7.3 3.6 - 11.0 K/uL   RBC 3.79 (L) 3.80 - 5.20 MIL/uL   Hemoglobin 11.5 (L) 12.0 - 16.0 g/dL   HCT 34.4 (L) 35.0 - 47.0 %   MCV 90.8 80.0 - 100.0 fL   MCH 30.4 26.0 - 34.0 pg   MCHC 33.5 32.0 - 36.0 g/dL   RDW 14.5 11.5 - 14.5 %   Platelets 355 150 - 440 K/uL   Neutrophils Relative % 65 %   Neutro Abs 4.8 1.4 - 6.5 K/uL   Lymphocytes Relative 26 %   Lymphs Abs 1.9 1.0 - 3.6 K/uL   Monocytes Relative 6 %   Monocytes Absolute 0.4 0.2 - 0.9 K/uL   Eosinophils Relative 3 %   Eosinophils Absolute 0.2 0 - 0.7 K/uL   Basophils Relative 0 %   Basophils Absolute 0.0 0 - 0.1 K/uL  Comprehensive metabolic panel     Status: Abnormal   Collection Time: 03/15/18  2:32 PM  Result Value Ref Range   Sodium 140 135 - 145 mmol/L   Potassium 3.8 3.5 - 5.1 mmol/L   Chloride 107 98 - 111 mmol/L   CO2 23 22 - 32 mmol/L   Glucose, Bld 126 (H) 70 - 99 mg/dL   BUN 10 8 - 23 mg/dL   Creatinine, Ser 1.08 (H) 0.44 - 1.00 mg/dL   Calcium 8.7 (L) 8.9 - 10.3 mg/dL   Total Protein 7.0 6.5 - 8.1 g/dL   Albumin 3.6 3.5 - 5.0 g/dL   AST 38 15 - 41 U/L   ALT 23 0 - 44 U/L   Alkaline Phosphatase 96 38 - 126 U/L   Total Bilirubin 0.7 0.3 - 1.2 mg/dL   GFR calc non Af Amer 53 (L) >60 mL/min   GFR calc Af Amer >60 >60 mL/min   Anion gap 10 5 - 15  Troponin I     Status: None   Collection Time: 03/15/18  2:32 PM  Result Value Ref Range   Troponin I <0.03 <0.03 ng/mL   ____________________________________________  EKG My review  and personal interpretation at Time: 14:38   Indication: hypotension  Rate: 70  Rhythm: sinus Axis: normal Other: normal intervals, ____________________________________________  RADIOLOGY   ____________________________________________   PROCEDURES  Procedure(s) performed:  Procedures    Critical Care performed: no ____________________________________________   INITIAL IMPRESSION / ASSESSMENT AND PLAN / ED  COURSE  Pertinent labs & imaging results that were available during my care of the patient were reviewed by me and considered in my medical decision making (see chart for details).   DDX: dehydration, orthostasis, uti, anemia, electrolyte abn  Valerie Vazquez is a 62 y.o. who presents to the ED with pressure.  Likely secondary to dehydration.  Patient does describe some epigastric discomfort therefore will further evaluation for ACS with serial enzymes.  EKG shows no evidence of acute ischemia.  She is otherwise well-appearing.  Blood work is reassuring thus far.  Patient will be signed out to oncoming physician pending repeat troponin.  Anticipate discharge home.      As part of my medical decision making, I reviewed the following data within the [place] notes reviewed and incorporated, Labs reviewed, notes from prior ED visits and [place] Controlled Substance Database   ____________________________________________   FINAL CLINICAL IMPRESSION(S) / ED DIAGNOSES  Final diagnoses:  Dehydration      NEW MEDICATIONS STARTED DURING THIS VISIT:  New Prescriptions   No medications on file     Note:  This document was prepared using Dragon voice recognition software and may include unintentional dictation errors.    Merlyn Lot, MD 03/15/18 [phone]

## 2018-03-15 NOTE — Progress Notes (Signed)
[name] MD, MRCP(U.K) [address]  [place]  [place], [place] [address]  Main: [phone]  Fax: [phone]   Primary Care Physician: [name], MD  Primary Gastroenterologist:  Dr. [name]   No chief complaint on file.   HPI: Valerie Vazquez is a 62 y.o. female   Summary of history : Previously been seen for constipation , GERD an dysphagia. Constipation resolved with Rx and dysphagia resolved with PPI  EGD 02/17/17-normal ( at the time of endoscopy she had no dysphagia) Colonoscopy 02/17/17- 6 mm serrated adenoma resected.She had been referred back for elavated GGT in 11/2017 . CT abdomen in 09/2016 showed no abnormalities of the liver .   Interval history4/15/19 -03/15/18   She says she has been doing well. On and off has been having episodes of diziness. BP was 70 systolic when we checked at the office, feels dizzy, no chest pains or shortness of breath, no other complaints, she took her anti hypertensive's 1 hour back. She was  Given some sweet drink and bread to eat.      Hepatic Function Latest Ref Rng & Units 11/16/2017 10/23/2017 08/21/2017  Total Protein 6.0 - 8.3 g/dL 7.4 7.5 7.8  Albumin 3.5 - 5.2 g/dL 3.9 4.0 4.3  AST 0 - 37 U/L 32 25 22  ALT 0 - 35 U/L 36(H) 21 23  Alk Phosphatase 39 - 117 U/L 97 99 124(H)  Total Bilirubin 0.2 - 1.2 mg/dL 0.3 0.4 0.5  Bilirubin, Direct 0.0 - 0.3 mg/dL 0.1 0.1 -          Current Outpatient Medications  Medication Sig Dispense Refill  . carvedilol (COREG) 3.125 MG tablet Take 1 tablet (3.125 mg total) by mouth 2 (two) times daily with a meal. 60 tablet 11  . cyclobenzaprine (FLEXERIL) 10 MG tablet TAKE 1 TABLET BY MOUTH THREE TIMES A DAY AS NEEDED FOR MUSCLE SPASMS 90 tablet 0  . dicyclomine (BENTYL) 20 MG tablet TAKE 1 TABLET BY MOUTH 3 TIMES A DAY AS NEEDED FOR SPASMS 270 tablet 0  . diphenhydrAMINE (BENADRYL) 25 MG tablet Take 1 tablet (25 mg total) by mouth every 6 (six) hours. 12 tablet  0  . ELIQUIS 5 MG TABS tablet TAKE 1 TABLET (5 MG TOTAL) BY MOUTH 2 (TWO) TIMES DAILY. 60 tablet 5  . escitalopram (LEXAPRO) 20 MG tablet Take 1 tablet (20 mg total) by mouth daily. 90 tablet 1  . gabapentin (NEURONTIN) 600 MG tablet Take 1-1.5 tablets (600-900 mg total) by mouth 2 (two) times daily. Take 1 tablet in the AM and 1.5 tablet at bedtime 225 tablet 1  . isosorbide mononitrate (IMDUR) 30 MG 24 hr tablet TAKE 1.5 TABLETS (45 MG TOTAL) BY MOUTH DAILY. 45 tablet 6  . levothyroxine (SYNTHROID, LEVOTHROID) 25 MCG tablet Take 1 tablet (25 mcg total) by mouth daily before breakfast. Wait 2-3 hours before taking any other medication or supplement 90 tablet 0  . Multiple Vitamins-Minerals (SUPER THERA VITE M) TABS Take by mouth.    . neomycin-polymyxin b-dexamethasone (MAXITROL) 3.5-10000-0.1 OINT APPLY A SMALL AMOUNT IN RIGHT EYE 2 TIMES A DAY FOR 14 DAYS  1  . nitroGLYCERIN (NITROSTAT) 0.4 MG SL tablet Place 1 tablet (0.4 mg total) under the tongue every 5 (five) minutes as needed for chest pain. 25 tablet 6  . OLANZapine (ZYPREXA) 10 MG tablet Take 1 tablet (10 mg total) by mouth at bedtime. 90 tablet 1  . ondansetron (ZOFRAN) 4 MG tablet  Take 1 tablet (4 mg total) by mouth 2 (two) times daily as needed for nausea or vomiting. 30 tablet 1  . pantoprazole (PROTONIX) 40 MG tablet TAKE 1 TABLET BY MOUTH EVERY DAY 60 tablet 0  . pravastatin (PRAVACHOL) 10 MG tablet Take 1 tablet (10 mg total) by mouth at bedtime. 90 tablet 1  . PROCARDIA 10 MG capsule TAKE 1 CAPSULE BY MOUTH 2 TIMES DAILY. TAKE 30 MINS BEFORE MEALS UP TO 1X PER DAY 180 capsule 1  . ramelteon (ROZEREM) 8 MG tablet Take 1 tablet (8 mg total) by mouth at bedtime. 90 tablet 1  . sitaGLIPtin (JANUVIA) 50 MG tablet Take 1 tablet (50 mg total) by mouth daily. 90 tablet 1   No current facility-administered medications for this visit.     Allergies as of 03/15/2018 - Review Complete 03/08/2018  Allergen Reaction Noted  . Abilify  [aripiprazole] Other (See Comments) 09/20/2013  . Augmentin [amoxicillin-pot clavulanate] Diarrhea and Nausea And Vomiting 09/20/2013  . Bactrim [sulfamethoxazole-trimethoprim] Diarrhea 09/20/2013  . Ceftin [cefuroxime axetil] Diarrhea 09/20/2013  . Coconut oil  10/19/2015  . Diclofenac Other (See Comments) 08/01/2014  . Dye fdc red [red dye]  01/22/2016  . Indocin [indomethacin] Other (See Comments) 09/20/2013  . Linaclotide Diarrhea 12/09/2014  . Lisinopril Diarrhea and Other (See Comments) 09/20/2013  . Morphine Other (See Comments) 12/09/2014  . Morphine and related Other (See Comments) 09/20/2013  . Simvastatin Other (See Comments) 08/01/2014  . Sulfa antibiotics Diarrhea 12/09/2014  . Sulfamethoxazole-trimethoprim Diarrhea 12/09/2014  . Wellbutrin [bupropion] Other (See Comments) 09/20/2013  . Zetia [ezetimibe]  09/18/2017  . Oxycodone-acetaminophen Itching 07/10/2014  . Quetiapine fumarate Palpitations 08/01/2014  . Quetiapine fumarate Palpitations 08/16/2014    ROS:  General: Negative for anorexia, weight loss, fever, chills, fatigue, weakness. ENT: Negative for hoarseness, difficulty swallowing , nasal congestion. CV: Negative for chest pain, angina, palpitations, dyspnea on exertion, peripheral edema.  Respiratory: Negative for dyspnea at rest, dyspnea on exertion, cough, sputum, wheezing.  GI: See history of present illness. GU:  Negative for dysuria, hematuria, urinary incontinence, urinary frequency, nocturnal urination.  Endo: Negative for unusual weight change.    Physical Examination:   There were no vitals taken for this visit.  General: Well-nourished, well-developed in no acute distress.  Eyes: No icterus. Conjunctivae pink. Mouth: Oropharyngeal mucosa moist and pink , no lesions erythema or exudate. Lungs: Clear to auscultation bilaterally. Non-labored. Heart: Regular rate and rhythm, no murmurs rubs or gallops.  Abdomen: Bowel sounds are normal,  nontender, nondistended, no hepatosplenomegaly or masses, no abdominal bruits or hernia , no rebound or guarding.   Extremities: No lower extremity edema. No clubbing or deformities. Neuro: Alert and oriented x 3.  Grossly intact. Skin: Warm and dry, no jaundice.   Psych: Alert and cooperative, normal mood and affect.   Imaging Studies: No results found.  Assessment and Plan:   Valerie Vazquez is a 61 y.o. y/o female  to follow up for elevated GGT. A few months back Alk phos was also elevated and hence GGT checked which was elevated suggesting a liver etiology. She has been on long term acetaminophen which she just stopped and recent LFT's in 10/2017 were  almost normal   Plan  1. At her last visit was to repeat LFT';s , GGT in 3 months time- if normalized no further work up but if they are still elevated would need further evaluation  2. Low Blood pressure. Rechecked still low at 90 systolic and symptomatic with   diziness- She has no one to take her to the ER- will send via EMS ?related to antihypertensives ?    Dr    MD,MRCP (U.K) Follow up in 3 months    

## 2018-03-16 ENCOUNTER — Other Ambulatory Visit: Payer: Self-pay

## 2018-03-16 DIAGNOSIS — R748 Abnormal levels of other serum enzymes: Secondary | ICD-10-CM

## 2018-03-18 ENCOUNTER — Encounter: Payer: Self-pay | Admitting: Gastroenterology

## 2018-03-19 ENCOUNTER — Encounter: Payer: Self-pay | Admitting: Family Medicine

## 2018-03-19 ENCOUNTER — Ambulatory Visit (INDEPENDENT_AMBULATORY_CARE_PROVIDER_SITE_OTHER): Payer: Medicare HMO | Admitting: Family Medicine

## 2018-03-19 VITALS — BP 92/60 | HR 94 | Temp 98.4°F | Resp 15 | Ht 66.0 in | Wt 208.8 lb

## 2018-03-19 DIAGNOSIS — E119 Type 2 diabetes mellitus without complications: Secondary | ICD-10-CM

## 2018-03-19 DIAGNOSIS — K224 Dyskinesia of esophagus: Secondary | ICD-10-CM

## 2018-03-19 DIAGNOSIS — I951 Orthostatic hypotension: Secondary | ICD-10-CM | POA: Diagnosis not present

## 2018-03-19 DIAGNOSIS — D649 Anemia, unspecified: Secondary | ICD-10-CM | POA: Diagnosis not present

## 2018-03-19 DIAGNOSIS — K047 Periapical abscess without sinus: Secondary | ICD-10-CM

## 2018-03-19 MED ORDER — CLINDAMYCIN HCL 150 MG PO CAPS: 450.0000 mg | ORAL_CAPSULE | Freq: Three times a day (TID) | ORAL | 0 refills | Status: DC

## 2018-03-19 NOTE — Patient Instructions (Addendum)
Nice to see you. We will start you on an antibiotic.  You need to set up a dentist appointment.  If you develop increasing pain or you develop fevers you need to be evaluated immediately. You need to increase your hydration.  We will check lab work and contact you with the results.  Once your labs return we will consider your blood pressure medications. If you have worsening lightheadedness or you pass out you need to go to the emergency room for evaluation.

## 2018-03-20 LAB — HEMOGLOBIN A1C
Hgb A1c MFr Bld: 6.5 % of total Hgb — ABNORMAL HIGH (ref <5.7)
Mean Plasma Glucose: 140 (calc)
eAG (mmol/L): 7.7 (calc)

## 2018-03-20 LAB — BASIC METABOLIC PANEL
BUN/Creatinine Ratio: 11 (calc) (ref 6–22)
BUN: 11 mg/dL (ref 7–25)
CO2: 23 mmol/L (ref 20–32)
Calcium: 9.8 mg/dL (ref 8.6–10.4)
Chloride: 109 mmol/L (ref 98–110)
Creat: 1.03 mg/dL — ABNORMAL HIGH (ref 0.50–0.99)
Glucose, Bld: 139 mg/dL — ABNORMAL HIGH (ref 65–99)
Potassium: 3.7 mmol/L (ref 3.5–5.3)
Sodium: 141 mmol/L (ref 135–146)

## 2018-03-20 LAB — CBC
HCT: 35.2 % (ref 35.0–45.0)
Hemoglobin: 11.9 g/dL (ref 11.7–15.5)
MCH: 29.7 pg (ref 27.0–33.0)
MCHC: 33.8 g/dL (ref 32.0–36.0)
MCV: 87.8 fL (ref 80.0–100.0)
MPV: 10 fL (ref 7.5–12.5)
Platelets: 387 10*3/uL (ref 140–400)
RBC: 4.01 10*6/uL (ref 3.80–5.10)
RDW: 13.3 % (ref 11.0–15.0)
WBC: 7.1 10*3/uL (ref 3.8–10.8)

## 2018-03-21 DIAGNOSIS — I951 Orthostatic hypotension: Secondary | ICD-10-CM | POA: Insufficient documentation

## 2018-03-21 DIAGNOSIS — K047 Periapical abscess without sinus: Secondary | ICD-10-CM | POA: Insufficient documentation

## 2018-03-21 NOTE — Assessment & Plan Note (Signed)
Repeat CBC

## 2018-03-21 NOTE — Assessment & Plan Note (Signed)
Check A1c.  Continue current regimen. 

## 2018-03-21 NOTE — Progress Notes (Signed)
Valerie Rumps, MD Phone: [phone]  BAYLEA MILBURN is a 62 y.o. female who presents today for f/u.  CC: dm, orthostatic hypotension, right sided tooth pain  DIABETES Disease Monitoring: Blood Sugar ranges-126 Polyuria/phagia/dipsia- no      Medications: Compliance- taking [place] Hypoglycemic symptoms- rare, she will have the shakes and then drinks something sugary and feels improved  Hypotension: She was sent to the emergency department by GI related to low blood pressures.  She is currently on carvedilol, Imdur, and Procardia for her blood pressure and history of coronary vasospasm.  She was on diltiazem in the past though the pharmacy did not have this for some time.  She will get lightheaded when she stands and then walks.  She has not had any syncopal episodes.  This has been going on a few months.  She was felt to be dehydrated.  Had 2 negative troponins.  Minimally anemic.  She received 1 L of fluid and appears felt improved from this.  Patient feels as though she has a right-sided tooth abscess.  She has had these in the past.  She feels tender over the anterior outer upper right teeth.  She is missing some teeth in the posterior aspect of all 4 quadrants of her mouth.  She has had some tenderness externally for about a week.  No drainage.  No fevers.  She has not seen a dentist.  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   Social History   Tobacco Use  Smoking Status Former Smoker  . Packs/day: 1.50  . Years: 10.00  . Pack years: 15.00  . Types: Cigarettes  . Last attempt to quit: 04/12/1979  . Years since quitting: 38.9  Smokeless Tobacco Never Used     ROS see history of present illness  Objective  Physical Exam Vitals:   03/19/18 1530  BP: 92/60  Pulse: 94  Resp: 15  Temp: 98.4 F (36.9 C)  SpO2: 95%    BP Readings from Last 3 Encounters:  03/19/18 92/60  03/15/18 (!) 154/95  03/15/18 93/60   Wt Readings from Last 3 Encounters:  03/19/18 208 lb 12.8  oz (94.7 kg)  03/15/18 200 lb (90.7 kg)  03/15/18 207 lb 3.2 oz (94 kg)    Physical Exam  Constitutional: No distress.  HENT:  Poor dentition with lack of teeth in the posterior portion of each quadrant of her mouth, there is no tooth tenderness internally though when pressing externally over her gums and teeth on the upper right side towards the midportion of that quadrant there appears to be some tenderness, there is no overlying swelling or erythema no apparent drainage internally  Cardiovascular: Normal rate, regular rhythm and normal heart sounds.  Pulmonary/Chest: Effort normal and breath sounds normal.  Musculoskeletal: She exhibits no edema.  Neurological: She is alert.  Skin: Skin is warm and dry. She is not diaphoretic.     Assessment/Plan: Please see individual problem list.  Diabetes mellitus without complication (HCC) Check A1c.  Continue current regimen.  Anemia Repeat CBC.  Orthostatic hypotension Patient is orthostatic today on exam.  I suspect this is dehydration related given that she improved with fluids in the ED.  Her current blood pressure medications are at minimal dosages and if we can keep her on these that would be preferable given her other comorbidities.  She reports she only drinks two 16 ounce bottles of water daily.  I encouraged her to increase this.  We will recheck a CBC to ensure that  that has not dropped further.  We will check kidney function.  I will have the CMA contact the patient to see if we can have her return in 2 weeks for nurse BP check for orthostatics.    Dental infection Concern for possible dental infection.  She does have some tenderness and discomfort in her right upper quadrant of teeth.  No other significant signs of infection.  Check WBC.  Will start on clindamycin given her medication allergies and her stating that she was told never to take a penicillin related antibiotic again as it may kill her per her report.  She is given  return precautions.  We previously attempted to find her a dentist though were advised by several offices that it would be best if the patient called to figure out who is covered by her insurance.  The patient states she has found out what her cost would be and she will call when she is ready.   Orders Placed This Encounter  Procedures  . Basic Metabolic Panel (BMET)  . CBC  . HgB A1c    Meds ordered this encounter  Medications  . clindamycin (CLEOCIN) 150 MG capsule    Sig: Take 3 capsules (450 mg total) by mouth 3 (three) times daily.    Dispense:  63 capsule    Refill:  0     Valerie Rumps, MD [place]

## 2018-03-21 NOTE — Assessment & Plan Note (Signed)
Patient is orthostatic today on exam.  I suspect this is dehydration related given that she improved with fluids in the ED.  Her current blood pressure medications are at minimal dosages and if we can keep her on these that would be preferable given her other comorbidities.  She reports she only drinks two 16 ounce bottles of water daily.  I encouraged her to increase this.  We will recheck a CBC to ensure that that has not dropped further.  We will check kidney function.  I will have the CMA contact the patient to see if we can have her return in 2 weeks for nurse BP check for orthostatics.

## 2018-03-21 NOTE — Assessment & Plan Note (Signed)
Concern for possible dental infection.  She does have some tenderness and discomfort in her right upper quadrant of teeth.  No other significant signs of infection.  Check WBC.  Will start on clindamycin given her medication allergies and her stating that she was told never to take a penicillin related antibiotic again as it may kill her per her report.  She is given return precautions.  We previously attempted to find her a dentist though were advised by several offices that it would be best if the patient called to figure out who is covered by her insurance.  The patient states she has found out what her cost would be and she will call when she is ready.

## 2018-03-22 ENCOUNTER — Telehealth: Payer: Self-pay

## 2018-03-22 NOTE — Progress Notes (Signed)
Sent mychart message, patient does not have voicemail set up

## 2018-03-22 NOTE — Telephone Encounter (Signed)
-----   Message from [name], MD sent at 03/21/2018  4:02 PM EDT ----- Please contact the patient and see if we can have her come back in 1 to 2 weeks to recheck blood pressure and do orthostatics with a nurse visit.  Thanks.  Eric.

## 2018-03-24 ENCOUNTER — Other Ambulatory Visit: Payer: Self-pay | Admitting: Family Medicine

## 2018-03-24 DIAGNOSIS — E063 Autoimmune thyroiditis: Secondary | ICD-10-CM

## 2018-03-25 ENCOUNTER — Other Ambulatory Visit: Payer: Self-pay

## 2018-03-26 ENCOUNTER — Other Ambulatory Visit: Payer: Self-pay

## 2018-03-26 DIAGNOSIS — E785 Hyperlipidemia, unspecified: Secondary | ICD-10-CM

## 2018-03-26 MED ORDER — PRAVASTATIN SODIUM 10 MG PO TABS: 10.0000 mg | ORAL_TABLET | Freq: Every day | ORAL | 1 refills | Status: DC

## 2018-03-30 ENCOUNTER — Ambulatory Visit: Payer: Medicare HMO

## 2018-03-30 DIAGNOSIS — E063 Autoimmune thyroiditis: Secondary | ICD-10-CM

## 2018-03-30 DIAGNOSIS — R748 Abnormal levels of other serum enzymes: Secondary | ICD-10-CM | POA: Diagnosis not present

## 2018-03-30 NOTE — Progress Notes (Signed)
Patient came in today for BP check for Dr. Caryl Bis  Left arm: 126/78 Pulse: 86 O2: 96  Right arm: 124/86 Pulse:87 O2:96   Patient is taking all medications.

## 2018-03-31 LAB — HEPATIC FUNCTION PANEL
ALT: 20 IU/L (ref 0–32)
AST: 20 IU/L (ref 0–40)
Albumin: 4 g/dL (ref 3.6–4.8)
Alkaline Phosphatase: 108 IU/L (ref 39–117)
Bilirubin Total: 0.3 mg/dL (ref 0.0–1.2)
Bilirubin, Direct: 0.11 mg/dL (ref 0.00–0.40)
Total Protein: 7.1 g/dL (ref 6.0–8.5)

## 2018-03-31 LAB — GAMMA GT: GGT: 45 IU/L (ref 0–60)

## 2018-03-31 LAB — SPECIMEN STATUS REPORT

## 2018-04-01 NOTE — Progress Notes (Signed)
BP is acceptable. She was low normal previously. She should continue her current regimen.

## 2018-04-05 ENCOUNTER — Other Ambulatory Visit: Payer: Self-pay | Admitting: Family Medicine

## 2018-04-06 NOTE — Telephone Encounter (Signed)
Last OV 03/19/18 last filled 03/09/18 90 0rf

## 2018-04-08 ENCOUNTER — Encounter: Payer: Self-pay | Admitting: Internal Medicine

## 2018-04-08 ENCOUNTER — Ambulatory Visit (INDEPENDENT_AMBULATORY_CARE_PROVIDER_SITE_OTHER): Payer: Medicare HMO

## 2018-04-08 ENCOUNTER — Ambulatory Visit (INDEPENDENT_AMBULATORY_CARE_PROVIDER_SITE_OTHER): Payer: Medicare HMO | Admitting: Internal Medicine

## 2018-04-08 ENCOUNTER — Other Ambulatory Visit: Payer: Self-pay | Admitting: Family Medicine

## 2018-04-08 VITALS — BP 134/70 | HR 86 | Temp 98.5°F | Ht 66.0 in | Wt 215.8 lb

## 2018-04-08 DIAGNOSIS — M542 Cervicalgia: Secondary | ICD-10-CM

## 2018-04-08 DIAGNOSIS — Z760 Encounter for issue of repeat prescription: Secondary | ICD-10-CM

## 2018-04-08 DIAGNOSIS — M79601 Pain in right arm: Secondary | ICD-10-CM

## 2018-04-08 DIAGNOSIS — M25511 Pain in right shoulder: Secondary | ICD-10-CM | POA: Diagnosis not present

## 2018-04-08 DIAGNOSIS — T148XXA Other injury of unspecified body region, initial encounter: Secondary | ICD-10-CM

## 2018-04-08 DIAGNOSIS — M19011 Primary osteoarthritis, right shoulder: Secondary | ICD-10-CM

## 2018-04-08 DIAGNOSIS — M47812 Spondylosis without myelopathy or radiculopathy, cervical region: Secondary | ICD-10-CM | POA: Diagnosis not present

## 2018-04-08 MED ORDER — DICLOFENAC SODIUM 1 % TD GEL: 2.0000 g | Freq: Four times a day (QID) | TRANSDERMAL | 2 refills | Status: DC

## 2018-04-08 NOTE — Progress Notes (Signed)
Chief Complaint  Patient presents with  . Follow-up   F/u  1. C/o split in muscle right arm s/p trauma as kid and having pain in right 3rd to 5th fingers and part of right index finger and numbness and reduced rom fingers and swelling of the fingers.  2. Chronic low back and neck pain. Saw [facility] for pain in low back but nervous about back injections, reviewed Xray cervical 2015 with arthritis deg changes s/p surgery and fusion.  3. H/o left shoulder arthritis. C/o today right upper arm pain and shoulder pain    Review of Systems  Respiratory: Negative for shortness of breath.   Cardiovascular: Negative for chest pain.  Musculoskeletal: Positive for joint pain and neck pain.  Neurological: Positive for sensory change.   Past Medical History:  Diagnosis Date  . Acute right-sided low back pain with right-sided sciatica 10/23/2017  . Anemia   . Bilateral renal cysts   . CAD (coronary artery disease) 09/28/2016  . Chest pain 10/14/2015  . Chronic back pain    "upper and lower" (09/19/2014)  . Chronic shoulder pain (Location of Primary Source of Pain) (Left) 01/29/2016  . Colitis 04/08/2016  . Coronary vasospasm ([place]) 12/09/2014  . Depression    "major" (09/19/2014)  . DVT (deep venous thrombosis) ([place])   . Esophageal spasm   . Essential hypertension 10/28/2013  . Gallstones   . Gastritis   . Gastroenteritis   . GERD (gastroesophageal reflux disease)   . Headache    "couple /month lately" (09/19/2014)  . Heart disease 12/09/2016  . High blood pressure   . High cholesterol   . History of blood transfusion 1985   related to hysterectomy  . History of nuclear stress test    Myoview 1/19:  EF 65, no ischemia or scar  . History of pulmonary embolus (PE) 09/19/2014  . 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 Rule out Paget's disease of the bone with blood work.   . Hyperlipidemia 10/28/2013  . Hypertension   . IBS (irritable bowel syndrome)   . Lumbar central  spinal stenosis (moderate at L4-5; mild at L2-3 and L3-4) 01/29/2016  . Migraine    "a few times/yr" (09/19/2014)  . Myocardial infarction ([place]) 10/2010 X 3   "while hospitalized"  . Neck pain 01/29/2016  . Osteoarthritis    "qwhere; mostly around my joints" (09/19/2014)  . Pleural effusion, bilateral 07/31/2015  . Pneumonia 04/2014  . PTSD (post-traumatic stress disorder)   . Pulmonary embolism ([place]) 04/2001; 09/19/2014   "after gallbladder OR; "  . Schizoaffective disorder ([place])   . Schizoaffective disorder, depressive type ([place]) 08/28/2016  . Sleep apnea    "mild" (09/19/2014)  . Snoring    a. sleep study 5/16: No OSA  . SOB (shortness of breath) 07/31/2015  . Spinal stenosis   . Spondylosis   . Suicide attempt ([place]) 08/27/2016  . Type II diabetes mellitus ([place])    "dx'd in 2007; lost weight; no RX for ~ 4 yr now" (09/19/2014)  . Wheezing 02/25/2017   Past Surgical History:  Procedure Laterality Date  . ABDOMINAL HYSTERECTOMY  1985  . ANTERIOR CERVICAL DECOMP/DISCECTOMY FUSION  10/2012   in [place] C5-C7   . APPENDECTOMY  1985  . BACK SURGERY    . BREAST CYST EXCISION Right   . BREAST EXCISIONAL BIOPSY Right YRS AGO   NEG  . CARDIAC CATHETERIZATION   2000's; 2009  . CHOLECYSTECTOMY  2002  . COLONOSCOPY WITH  PROPOFOL N/A 12/12/2016   Procedure: COLONOSCOPY WITH PROPOFOL;  Surgeon: [name], MD;  Location: [facility] ENDOSCOPY;  Service: Endoscopy;  Laterality: N/A;  . COLONOSCOPY WITH PROPOFOL N/A 02/17/2017   Procedure: COLONOSCOPY WITH PROPOFOL;  Surgeon: [name], MD;  Location: [facility] ENDOSCOPY;  Service: Gastroenterology;  Laterality: N/A;  . ESOPHAGOGASTRODUODENOSCOPY (EGD) WITH PROPOFOL N/A 02/17/2017   Procedure: ESOPHAGOGASTRODUODENOSCOPY (EGD) WITH PROPOFOL;  Surgeon: [name], MD;  Location: [facility] ENDOSCOPY;  Service: Gastroenterology;  Laterality: N/A;  . EXCISION/RELEASE BURSA HIP Right 12/1984  . HERNIA REPAIR  1985  . LEFT HEART CATHETERIZATION WITH CORONARY ANGIOGRAM  N/A 12/09/2014   Procedure: LEFT HEART CATHETERIZATION WITH CORONARY ANGIOGRAM;  Surgeon: Burnell Blanks, MD;  Location: [facility] CATH LAB;  Service: Cardiovascular;  Laterality: N/A;  . TONSILLECTOMY AND ADENOIDECTOMY  ~ Nov 11, 1966  . TOTAL HIP ARTHROPLASTY Left 04-06-2014  . UMBILICAL HERNIA REPAIR  1985  . VENA CAVA FILTER PLACEMENT  03/2014   Family History  Problem Relation Age of Onset  . Stroke Mother        Deceased  . Lung cancer Father        Deceased  . Healthy Daughter   . Healthy Son        x 2  . Other Son        Suicide  . Cancer Brother   . Heart attack Neg Hx    Social History   Socioeconomic History  . Marital status: Divorced    Spouse name: Not on file  . Number of children: Not on file  . Years of education: Not on file  . Highest education level: Not on file  Occupational History  . Not on file  Social Needs  . Financial resource strain: Not hard at all  . Food insecurity:    Worry: Never true    Inability: Never true  . Transportation needs:    Medical: No    Non-medical: No  Tobacco Use  . Smoking status: Former Smoker    Packs/day: 1.50    Years: 10.00    Pack years: 15.00    Types: Cigarettes    Last attempt to quit: 04/12/1979    Years since quitting: 39.0  . Smokeless tobacco: Never Used  Substance and Sexual Activity  . Alcohol use: No    Alcohol/week: 0.0 standard drinks  . Drug use: No  . Sexual activity: Not Currently  Lifestyle  . Physical activity:    Days per week: 0 days    Minutes per session: Not on file  . Stress: Not on file  Relationships  . Social connections:    Talks on phone: Not on file    Gets together: Not on file    Attends religious service: Not on file    Active member of club or organization: Not on file    Attends meetings of clubs or organizations: Not on file    Relationship status: Not on file  . Intimate partner violence:    Fear of current or ex partner: No    Emotionally abused: No    Physically  abused: No    Forced sexual activity: No  Other Topics Concern  . Not on file  Social History Narrative   Lives with fiancee in an apartment on the first floor.  Has 3 grown children.  One child passed away.  On disability November 11, 1994 - 2000, 2007 - current.     Education: some college.   Current Meds  Medication Sig  .  carvedilol (COREG) 3.125 MG tablet Take 1 tablet (3.125 mg total) by mouth 2 (two) times daily with a meal.  . clindamycin (CLEOCIN) 150 MG capsule Take 3 capsules (450 mg total) by mouth 3 (three) times daily.  . cyclobenzaprine (FLEXERIL) 10 MG tablet TAKE 1 TABLET BY MOUTH THREE TIMES A DAY AS NEEDED FOR MUSCLE SPASMS  . dicyclomine (BENTYL) 20 MG tablet TAKE 1 TABLET BY MOUTH 3 TIMES A DAY AS NEEDED FOR SPASMS  . diphenhydrAMINE (BENADRYL) 25 MG tablet Take 1 tablet (25 mg total) by mouth every 6 (six) hours.  Marland Kitchen ELIQUIS 5 MG TABS tablet TAKE 1 TABLET (5 MG TOTAL) BY MOUTH 2 (TWO) TIMES DAILY.  Marland Kitchen escitalopram (LEXAPRO) 20 MG tablet Take 1 tablet (20 mg total) by mouth daily.  Marland Kitchen gabapentin (NEURONTIN) 600 MG tablet Take 1-1.5 tablets (600-900 mg total) by mouth 2 (two) times daily. Take 1 tablet in the AM and 1.5 tablet at bedtime  . isosorbide mononitrate (IMDUR) 30 MG 24 hr tablet TAKE 1.5 TABLETS (45 MG TOTAL) BY MOUTH DAILY.  Marland Kitchen levothyroxine (SYNTHROID, LEVOTHROID) 25 MCG tablet TAKE 1 TABLET BY MOUTH BEFORE BREAKFAST 2-3 HOURS BEFORE TAKING ANY OTHER MEDICATION OR SUPPLEMENT  . Multiple Vitamins-Minerals (SUPER THERA VITE M) TABS Take by mouth.  . neomycin-polymyxin b-dexamethasone (MAXITROL) 3.5-10000-0.1 OINT APPLY A SMALL AMOUNT IN RIGHT EYE 2 TIMES A DAY FOR 14 DAYS  . nitroGLYCERIN (NITROSTAT) 0.4 MG SL tablet Place 1 tablet (0.4 mg total) under the tongue every 5 (five) minutes as needed for chest pain.  Marland Kitchen OLANZapine (ZYPREXA) 10 MG tablet Take 1 tablet (10 mg total) by mouth at bedtime.  . ondansetron (ZOFRAN) 4 MG tablet Take 1 tablet (4 mg total) by mouth 2 (two)  times daily as needed for nausea or vomiting.  . pantoprazole (PROTONIX) 40 MG tablet TAKE 1 TABLET BY MOUTH EVERY DAY  . pravastatin (PRAVACHOL) 10 MG tablet Take 1 tablet (10 mg total) by mouth at bedtime.  Marland Kitchen PROCARDIA 10 MG capsule TAKE 1 CAPSULE BY MOUTH 2 TIMES DAILY. TAKE 30 MINS BEFORE MEALS UP TO 1X PER DAY  . ramelteon (ROZEREM) 8 MG tablet Take 1 tablet (8 mg total) by mouth at bedtime.  . sitaGLIPtin (JANUVIA) 50 MG tablet Take 1 tablet (50 mg total) by mouth daily.   Allergies  Allergen Reactions  . Abilify [Aripiprazole] Other (See Comments)    Jerking movement, slow motor skills  . Augmentin [Amoxicillin-Pot Clavulanate] Diarrhea and Nausea And Vomiting  . Bactrim [Sulfamethoxazole-Trimethoprim] Diarrhea  . Ceftin [Cefuroxime Axetil] Diarrhea  . Coconut Oil     Rash   . Diclofenac Other (See Comments)     Muscle Pain  . Dye Fdc Red [Red Dye]     "hair dye"  . Indocin [Indomethacin] Other (See Comments)    Migraines   . Linaclotide Diarrhea  . Lisinopril Diarrhea and Other (See Comments)    Other reaction(s): Dizziness, Vomiting  . Morphine Other (See Comments)    Chest Tightness  . Morphine And Related Other (See Comments)    Chest tightness   . Simvastatin Other (See Comments)    Muscle Pain  . Sulfa Antibiotics Diarrhea  . Sulfamethoxazole-Trimethoprim Diarrhea  . Wellbutrin [Bupropion] Other (See Comments)    Jerking movement Jerking movement Slurred speech  . Zetia [Ezetimibe]     Diarrhea   . Oxycodone-Acetaminophen Itching  . Quetiapine Fumarate Palpitations  . Quetiapine Fumarate Palpitations   Recent Results (from the past 2160 hour(s))  CBC with Differential/Platelet     Status: Abnormal   Collection Time: 03/15/18  2:32 PM  Result Value Ref Range   WBC 7.3 3.6 - 11.0 K/uL   RBC 3.79 (L) 3.80 - 5.20 MIL/uL   Hemoglobin 11.5 (L) 12.0 - 16.0 g/dL   HCT 34.4 (L) 35.0 - 47.0 %   MCV 90.8 80.0 - 100.0 fL   MCH 30.4 26.0 - 34.0 pg   MCHC 33.5  32.0 - 36.0 g/dL   RDW 14.5 11.5 - 14.5 %   Platelets 355 150 - 440 K/uL   Neutrophils Relative % 65 %   Neutro Abs 4.8 1.4 - 6.5 K/uL   Lymphocytes Relative 26 %   Lymphs Abs 1.9 1.0 - 3.6 K/uL   Monocytes Relative 6 %   Monocytes Absolute 0.4 0.2 - 0.9 K/uL   Eosinophils Relative 3 %   Eosinophils Absolute 0.2 0 - 0.7 K/uL   Basophils Relative 0 %   Basophils Absolute 0.0 0 - 0.1 K/uL    Comment: Performed at [facility], [address] [place], [place] [address]  Comprehensive metabolic panel     Status: Abnormal   Collection Time: 03/15/18  2:32 PM  Result Value Ref Range   Sodium 140 135 - 145 mmol/L   Potassium 3.8 3.5 - 5.1 mmol/L   Chloride 107 98 - 111 mmol/L    Comment: Please note change in reference range.   CO2 23 22 - 32 mmol/L   Glucose, Bld 126 (H) 70 - 99 mg/dL    Comment: Please note change in reference range.   BUN 10 8 - 23 mg/dL    Comment: Please note change in reference range.   Creatinine, Ser 1.08 (H) 0.44 - 1.00 mg/dL   Calcium 8.7 (L) 8.9 - 10.3 mg/dL   Total Protein 7.0 6.5 - 8.1 g/dL   Albumin 3.6 3.5 - 5.0 g/dL   AST 38 15 - 41 U/L   ALT 23 0 - 44 U/L    Comment: Please note change in reference range.   Alkaline Phosphatase 96 38 - 126 U/L   Total Bilirubin 0.7 0.3 - 1.2 mg/dL   GFR calc non Af Amer 53 (L) >60 mL/min   GFR calc Af Amer >60 >60 mL/min    Comment: (NOTE) The eGFR has been calculated using the CKD EPI equation. This calculation has not been validated in all clinical situations. eGFR's persistently <60 mL/min signify possible Chronic Kidney Disease.    Anion gap 10 5 - 15    Comment: Performed at [facility], [address] [place], [place] [address]  Troponin I     Status: None   Collection Time: 03/15/18  2:32 PM  Result Value Ref Range   Troponin I <0.03 <0.03 ng/mL    Comment: Performed at [facility], [address] [place], [place] [address]  Urinalysis, Complete w Microscopic      Status: Abnormal   Collection Time: 03/15/18  3:37 PM  Result Value Ref Range   Color, Urine YELLOW (A) YELLOW   APPearance HAZY (A) CLEAR   Specific Gravity, Urine 1.004 (L) 1.005 - 1.030   pH 6.0 5.0 - 8.0   Glucose, UA NEGATIVE NEGATIVE mg/dL   Hgb urine dipstick NEGATIVE NEGATIVE   Bilirubin Urine NEGATIVE NEGATIVE   Ketones, ur NEGATIVE NEGATIVE mg/dL   Protein, ur NEGATIVE NEGATIVE mg/dL   Nitrite NEGATIVE NEGATIVE   Leukocytes, UA NEGATIVE NEGATIVE   RBC /  HPF 0-5 0 - 5 RBC/hpf   WBC, UA 0-5 0 - 5 WBC/hpf   Bacteria, UA FEW (A) NONE SEEN   Squamous Epithelial / LPF 0-5 0 - 5   Mucus PRESENT    Hyaline Casts, UA PRESENT     Comment: Performed at [facility], [address] [place], [place] [address]  Troponin I     Status: None   Collection Time: 03/15/18  5:34 PM  Result Value Ref Range   Troponin I <0.03 <0.03 ng/mL    Comment: Performed at [facility], [address] [place], [place] [address]  Basic Metabolic Panel (BMET)     Status: Abnormal   Collection Time: 03/19/18  4:27 PM  Result Value Ref Range   Glucose, Bld 139 (H) 65 - 99 mg/dL    Comment: .            Fasting reference interval . For someone without known diabetes, a glucose value >125 mg/dL indicates that they may have diabetes and this should be confirmed with a follow-up test. .    BUN 11 7 - 25 mg/dL   Creat 1.03 (H) 0.50 - 0.99 mg/dL    Comment: For patients >68 years of age, the reference limit for Creatinine is approximately 13% higher for people identified as African-American. .    BUN/Creatinine Ratio 11 6 - 22 (calc)   Sodium 141 135 - 146 mmol/L   Potassium 3.7 3.5 - 5.3 mmol/L   Chloride 109 98 - 110 mmol/L   CO2 23 20 - 32 mmol/L   Calcium 9.8 8.6 - 10.4 mg/dL  CBC     Status: None   Collection Time: 03/19/18  4:27 PM  Result Value Ref Range   WBC 7.1 3.8 - 10.8 Thousand/uL   RBC 4.01 3.80 - 5.10 Million/uL   Hemoglobin 11.9 11.7 - 15.5 g/dL   HCT  35.2 35.0 - 45.0 %   MCV 87.8 80.0 - 100.0 fL   MCH 29.7 27.0 - 33.0 pg   MCHC 33.8 32.0 - 36.0 g/dL   RDW 13.3 11.0 - 15.0 %   Platelets 387 140 - 400 Thousand/uL   MPV 10.0 7.5 - 12.5 fL  HgB A1c     Status: Abnormal   Collection Time: 03/19/18  4:27 PM  Result Value Ref Range   Hgb A1c MFr Bld 6.5 (H) <5.7 % of total Hgb    Comment: For someone without known diabetes, a hemoglobin A1c value of 6.5% or greater indicates that they may have  diabetes and this should be confirmed with a follow-up  test. . For someone with known diabetes, a value <7% indicates  that their diabetes is well controlled and a value  greater than or equal to 7% indicates suboptimal  control. A1c targets should be individualized based on  duration of diabetes, age, comorbid conditions, and  other considerations. . Currently, no consensus exists regarding use of hemoglobin A1c for diagnosis of diabetes for children. .    Mean Plasma Glucose 140 (calc)   eAG (mmol/L) 7.7 (calc)  Hepatic function panel     Status: None   Collection Time: 03/30/18 12:00 AM  Result Value Ref Range   Total Protein 7.1 6.0 - 8.5 g/dL   Albumin 4.0 3.6 - 4.8 g/dL   Bilirubin Total 0.3 0.0 - 1.2 mg/dL   Bilirubin, Direct 0.11 0.00 - 0.40 mg/dL   Alkaline Phosphatase 108 39 - 117 IU/L   AST 20  0 - 40 IU/L   ALT 20 0 - 32 IU/L  Gamma GT     Status: None   Collection Time: 03/30/18 12:00 AM  Result Value Ref Range   GGT 45 0 - 60 IU/L  Specimen status report     Status: None   Collection Time: 03/30/18 12:00 AM  Result Value Ref Range   specimen status report Comment     Comment: Please note Please note The date and/or time of collection was not indicated on the requisition as required by state and federal law.  The date of receipt of the specimen was used as the collection date if not supplied.    Objective  Body mass index is 34.83 kg/m. Wt Readings from Last 3 Encounters:  04/08/18 215 lb 12.8 oz (97.9 kg)   03/19/18 208 lb 12.8 oz (94.7 kg)  03/15/18 200 lb (90.7 kg)   Temp Readings from Last 3 Encounters:  04/08/18 98.5 F (36.9 C) (Oral)  03/19/18 98.4 F (36.9 C) (Oral)  03/15/18 97.6 F (36.4 C) (Oral)   BP Readings from Last 3 Encounters:  04/08/18 134/70  03/30/18 124/86  03/19/18 92/60   Pulse Readings from Last 3 Encounters:  04/08/18 86  03/30/18 87  03/19/18 94    Physical Exam  Constitutional: She is oriented to person, place, and time. Vital signs are normal. She appears well-developed and well-nourished. She is cooperative.  HENT:  Head: Normocephalic and atraumatic.  Mouth/Throat: Oropharynx is clear and moist and mucous membranes are normal.  Eyes: Pupils are equal, round, and reactive to light. Conjunctivae are normal.  Cardiovascular: Normal rate, regular rhythm and normal heart sounds.  Pulmonary/Chest: Effort normal and breath sounds normal.  Musculoskeletal:       Cervical back: She exhibits decreased range of motion and tenderness.  Pain with ROM turning head to left   Right upper arm with indentation ? Muscle tear   Neurological: She is alert and oriented to person, place, and time. Gait normal.  Skin: Skin is warm, dry and intact.  Psychiatric: She has a normal mood and affect. Her speech is normal and behavior is normal. Judgment and thought content normal. Cognition and memory are normal.  Nursing note and vitals reviewed.   Assessment   1. C/w muscle tear right upper arm, humerus pain right arm h/o left shoulder arthritis noted Xray 2015   2. Chronic low back pain and neck pain  Plan   1 and 2. Prn voltaren  Xray C neck and right shoulder  MRI right humerus r/o muscle tear  Consider EMG/NCS Refer to [facility] to assess tear and tx pain and sx's  Provider: Dr. Olivia Mackie McLean-Scocuzza-Internal Medicine

## 2018-04-08 NOTE — Patient Instructions (Signed)
Refer back to [facility]   Arthritis Arthritis is a term that is commonly used to refer to joint pain or joint disease. There are more than 100 types of arthritis. What are the causes? The most common cause of this condition is wear and tear of a joint. Other causes include:  Gout.  Inflammation of a joint.  An infection of a joint.  Sprains and other injuries near the joint.  A drug reaction or allergic reaction.  In some cases, the cause may not be known. What are the signs or symptoms? The main symptom of this condition is pain in the joint with movement. Other symptoms include:  Redness, swelling, or stiffness at a joint.  Warmth coming from the joint.  Fever.  Overall feeling of illness.  How is this diagnosed? This condition may be diagnosed with a physical exam and tests, including:  Blood tests.  Urine tests.  Imaging tests, such as MRI, X-rays, or a CT scan.  Sometimes, fluid is removed from a joint for testing. How is this treated? Treatment for this condition may involve:  Treatment of the cause, if it is known.  Rest.  Raising (elevating) the joint.  Applying cold or hot packs to the joint.  Medicines to improve symptoms and reduce inflammation.  Injections of a steroid such as cortisone into the joint to help reduce pain and inflammation.  Depending on the cause of your arthritis, you may need to make lifestyle changes to reduce stress on your joint. These changes may include exercising more and losing weight. Follow these instructions at home: Medicines  Take over-the-counter and prescription medicines only as told by your health care provider.  Do not take aspirin to relieve pain if gout is suspected. Activity  Rest your joint if told by your health care provider. Rest is important when your disease is active and your joint feels painful, swollen, or stiff.  Avoid activities that make the pain worse. It is important to balance  activity with rest.  Exercise your joint regularly with range-of-motion exercises as told by your health care provider. Try doing low-impact exercise, such as: ? Swimming. ? Water aerobics. ? Biking. ? Walking. Joint Care   If your joint is swollen, keep it elevated if told by your health care provider.  If your joint feels stiff in the morning, try taking a warm shower.  If directed, apply heat to the joint. If you have diabetes, do not apply heat without permission from your health care provider. ? Put a towel between the joint and the hot pack or heating pad. ? Leave the heat on the area for 20-30 minutes.  If directed, apply ice to the joint: ? Put ice in a plastic bag. ? Place a towel between your skin and the bag. ? Leave the ice on for 20 minutes, 2-3 times per day.  Keep all follow-up visits as told by your health care provider. This is important. Contact a health care provider if:  The pain gets worse.  You have a fever. Get help right away if:  You develop severe joint pain, swelling, or redness.  Many joints become painful and swollen.  You develop severe back pain.  You develop severe weakness in your leg.  You cannot control your bladder or bowels. This information is not intended to replace advice given to you by your health care provider. Make sure you discuss any questions you have with your health care provider. Document Released: 09/25/2004 Document Revised:  01/24/2016 Document Reviewed: 11/13/2014 Elsevier Interactive Patient Education  [employer].

## 2018-04-08 NOTE — Progress Notes (Signed)
Pre visit review using our clinic review tool, if applicable. No additional management support is needed unless otherwise documented below in the visit note. 

## 2018-04-09 ENCOUNTER — Encounter: Payer: Self-pay | Admitting: Internal Medicine

## 2018-04-11 ENCOUNTER — Other Ambulatory Visit: Payer: Self-pay | Admitting: Family Medicine

## 2018-04-12 ENCOUNTER — Encounter: Payer: Self-pay | Admitting: Gastroenterology

## 2018-05-11 ENCOUNTER — Other Ambulatory Visit: Payer: Self-pay | Admitting: Family Medicine

## 2018-05-13 NOTE — Telephone Encounter (Signed)
Last filled 04/06/18 Last office visit 03/19/18

## 2018-05-20 ENCOUNTER — Other Ambulatory Visit: Payer: Self-pay | Admitting: Family Medicine

## 2018-05-20 DIAGNOSIS — R110 Nausea: Secondary | ICD-10-CM

## 2018-05-20 NOTE — Telephone Encounter (Signed)
Copied from [place] [phone]. Topic: Quick Communication - Rx Refill/Question >> May 20, 2018 12:21 PM Sheran Luz wrote: Medication: ondansetron (ZOFRAN) 4 MG tablet   Has the patient contacted their pharmacy? Yes, was advised by pharmacy to contact office.   Preferred Pharmacy (with phone number or street name): [id number] [name], [place] [phone] (Phone) [phone] (Fax)

## 2018-05-20 NOTE — Telephone Encounter (Signed)
rx request 

## 2018-05-20 NOTE — Telephone Encounter (Signed)
Rx refill request: ondansetron 4 mg        Last filled: 09/18/17  LOV: 04/08/18  PCP: [place]: verified

## 2018-05-21 NOTE — Telephone Encounter (Signed)
Sent to PCP for approval.  

## 2018-05-21 NOTE — Telephone Encounter (Signed)
Please contact the patient and see why she continues to require this medication.  It does not appear to have been refilled since January.

## 2018-05-26 ENCOUNTER — Other Ambulatory Visit: Payer: Self-pay | Admitting: Family Medicine

## 2018-05-26 DIAGNOSIS — F411 Generalized anxiety disorder: Secondary | ICD-10-CM

## 2018-05-26 NOTE — Telephone Encounter (Signed)
Copied from [place] [phone]. Topic: Quick Communication - Rx Refill/Question >> May 26, 2018 10:31 AM Wynetta Emery, [place] C wrote: Medication: gabapentin (NEURONTIN) 600 MG tablet- pt no longer see psy Dr. Parks Ranger prescribed medication.   Has the patient contacted their pharmacy? Yes.  (Agent: If no, request that the patient contact the pharmacy for the refill.) (Agent: If yes, when and what did the pharmacy advise?)  Preferred Pharmacy (with phone number or street name): [id number] [name], [place] [phone] (Phone) [phone] (Fax)    Agent: Please be advised that RX refills may take up to 3 business days. We ask that you follow-up with your pharmacy.

## 2018-05-26 NOTE — Telephone Encounter (Signed)
rx request 

## 2018-05-26 NOTE — Telephone Encounter (Signed)
Gabapentin 600 mg  refill Last Refill:12/08/17 # 225 ( 3 month supply) Last OV: 04/08/18 PCP: [phone]  Med previously ordered by historical provider.

## 2018-05-26 NOTE — Telephone Encounter (Signed)
Pt called in to follow up on refill request. 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 Pt says that she try to use medication only when she really need it considering that she didn't have refills. Pt would like to have refills if possible.

## 2018-05-27 NOTE — Telephone Encounter (Signed)
Sent to PCP for approval.  

## 2018-05-27 NOTE — Telephone Encounter (Signed)
Sent to PCP to advise 

## 2018-05-27 NOTE — Telephone Encounter (Signed)
Was filled by Dr. Aundra Dubin on 09-18-17. Patient requesting refill

## 2018-05-28 MED ORDER — ONDANSETRON HCL 4 MG PO TABS: 4.0000 mg | ORAL_TABLET | Freq: Two times a day (BID) | ORAL | 0 refills | Status: DC | PRN

## 2018-05-28 NOTE — Telephone Encounter (Signed)
This medication is managed by her psychiatrist. She should request refills from them.

## 2018-05-28 NOTE — Telephone Encounter (Signed)
I will send in a refill.  She should see GI symptoms to see if she will be placed on a medication to help with this.

## 2018-06-07 ENCOUNTER — Other Ambulatory Visit: Payer: Self-pay | Admitting: Family Medicine

## 2018-06-07 DIAGNOSIS — Z760 Encounter for issue of repeat prescription: Secondary | ICD-10-CM

## 2018-06-15 ENCOUNTER — Ambulatory Visit (INDEPENDENT_AMBULATORY_CARE_PROVIDER_SITE_OTHER): Payer: Medicare HMO | Admitting: Family Medicine

## 2018-06-15 ENCOUNTER — Encounter: Payer: Self-pay | Admitting: Family Medicine

## 2018-06-15 ENCOUNTER — Ambulatory Visit (INDEPENDENT_AMBULATORY_CARE_PROVIDER_SITE_OTHER): Payer: Medicare HMO

## 2018-06-15 VITALS — BP 114/76 | HR 87 | Temp 98.7°F | Ht 66.0 in | Wt 211.0 lb

## 2018-06-15 DIAGNOSIS — M160 Bilateral primary osteoarthritis of hip: Secondary | ICD-10-CM | POA: Diagnosis not present

## 2018-06-15 DIAGNOSIS — M25562 Pain in left knee: Secondary | ICD-10-CM

## 2018-06-15 DIAGNOSIS — M1712 Unilateral primary osteoarthritis, left knee: Secondary | ICD-10-CM | POA: Diagnosis not present

## 2018-06-15 MED ORDER — TRAMADOL HCL 50 MG PO TABS: 50.0000 mg | ORAL_TABLET | Freq: Three times a day (TID) | ORAL | 0 refills | Status: DC | PRN

## 2018-06-15 MED ORDER — METHYLPREDNISOLONE 4 MG PO TBPK: ORAL_TABLET | ORAL | 0 refills | Status: DC

## 2018-06-15 NOTE — Progress Notes (Signed)
l °

## 2018-06-15 NOTE — Progress Notes (Addendum)
Subjective:    Patient ID: Valerie Vazquez, female    DOB: 02-04-1956, 62 y.o.   MRN: [id number]  HPI  Patient presents to clinic c/o left knee pain for 2 weeks.  Previously patient has had aches in knees here and there, but now the pain is daily.  States it is difficult for her to stand and walk for extended periods of time due to pain in left knee.  Patient has history of osteoarthritis and both hips and lumbar spine stenosis, she has also had a left hip replacement in 2015.  Denies any new injury to left knee.  Denies any recent falls.   Patient Active Problem List   Diagnosis Date Noted  . Orthostatic hypotension 03/21/2018  . Dental infection 03/21/2018  . Boil 10/27/2017  . Urinary incontinence 10/27/2017  . Elevated alkaline phosphatase level 10/27/2017  . Acute right-sided low back pain with right-sided sciatica 10/23/2017  . Subclinical hypothyroidism 08/26/2017  . Soft tissue swelling 05/29/2017  . Diabetes mellitus without complication ([place]) 52/77/8242  . Acute right eye pain 03/25/2017  . Elevated glucose 02/25/2017  . Bruise 02/25/2017  . Wheezing 02/25/2017  . Paresthesia 02/06/2017  . Falls 01/30/2017  . Colon polyp 01/30/2017  . RUQ pain 01/12/2017  . Heart disease 12/09/2016  . Plantar fasciitis of left foot 11/07/2016  . Costochondritis 10/06/2016  . BRBPR (bright red blood per rectum) 10/06/2016  . CAD (coronary artery disease) 09/28/2016  . Diarrhea 09/16/2016  . Epigastric discomfort 09/16/2016  . Schizoaffective disorder, depressive type ([place]) 08/28/2016  . Suicide attempt ([place]) 08/27/2016  . Colitis 04/08/2016  . Absolute anemia 01/29/2016  . Neck pain 01/29/2016  . Chronic shoulder pain (Location of Primary Source of Pain) (Left) 01/29/2016  . Osteoarthritis of hip (Bilateral) 01/29/2016  . Lumbar central spinal stenosis (moderate at L4-5; mild at L2-3 and L3-4) 01/29/2016  . Hx of cervical spine surgery 01/29/2016  . Numbness 11/19/2015  .  Breast pain 10/19/2015  . Chest pain 10/14/2015  . Nausea and vomiting 08/15/2015  . SOB (shortness of breath) 07/31/2015  . Pleural effusion, bilateral 07/31/2015  . Esophageal spasm 06/05/2015  . History of DVT (deep vein thrombosis) 02/25/2015  . 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 Constipation 12/12/2014  . Coronary vasospasm ([place]) 12/09/2014  . History of pulmonary embolus (PE) 09/19/2014  . Anxiety and depression 04/13/2014  . IBS (irritable bowel syndrome) 04/11/2014  . GERD (gastroesophageal reflux disease) 04/11/2014  . Insomnia 04/11/2014  . Essential hypertension 10/28/2013  . Anemia 10/28/2013  . Hyperlipidemia 10/28/2013   Social History   Tobacco Use  . Smoking status: Former Smoker    Packs/day: 1.50    Years: 10.00    Pack years: 15.00    Types: Cigarettes    Last attempt to quit: 04/12/1979    Years since quitting: 39.2  . Smokeless tobacco: Never Used  Substance Use Topics  . Alcohol use: No    Alcohol/week: 0.0 standard drinks   Review of Systems  Constitutional: Negative for chills, fatigue and fever.  HENT: Negative for congestion, ear pain, sinus pain and sore throat.   Eyes: Negative.   Respiratory: Negative for cough, shortness of breath and wheezing.   Cardiovascular: Negative for chest pain, palpitations and leg swelling.  Gastrointestinal: Negative for abdominal pain, diarrhea, nausea and vomiting.  Genitourinary: Negative for dysuria, frequency and urgency.  Musculoskeletal: +left knee pain  Skin: Negative for color change, pallor and rash.  Neurological: Negative for syncope, light-headedness and headaches.  Psychiatric/Behavioral: The patient is not nervous/anxious.       Objective:   Physical Exam  Constitutional: She is oriented to person, place, and time. She appears well-nourished. No distress.  HENT:  Head: Normocephalic and atraumatic.  Eyes: EOM are normal. No scleral icterus.  Neck: Normal range of motion. Neck supple. No tracheal  deviation present.  Cardiovascular: Normal rate, regular rhythm and normal heart sounds.  Pulmonary/Chest: Effort normal and breath sounds normal. No respiratory distress.  Musculoskeletal: She exhibits tenderness. She exhibits no edema or deformity.       Left knee: She exhibits no swelling, no ecchymosis, no deformity, no erythema and normal alignment. Tenderness found.  Able to fully bend and extend left knee, pain at extreme limits of range. Negative anterior/posterior drawer test of left knee.   Neurological: She is alert and oriented to person, place, and time. No cranial nerve deficit.  Skin: Skin is warm and dry. No pallor.  Psychiatric: She has a normal mood and affect. Her behavior is normal.  Nursing note and vitals reviewed.  Vitals:   06/15/18 1521  BP: 114/76  Pulse: 87  Temp: 98.7 F (37.1 C)  SpO2: 95%      Assessment & Plan:    Acute pain of left knee - we will get knee x-ray.  Suspect pain is related to osteoarthritis due to history of this in both hips and in low back.  Patient will take Medrol Dosepak steroid taper to help calm pain and also given tramadol 50 mg to use occasionally as needed for moderate to severe pain.  Patient is unable to take NSAIDs due to being on Eliquis.  If pain continues to persist patient and I have discussed options of either going to orthopedics or sports medicine for possible joint injections and/or other treatments.   [place] PMP registry checked and is OK for Rx of tramadol  Keep regularly scheduled follow up as planned. Return to clinic sooner if issues persist or worsen.

## 2018-06-16 ENCOUNTER — Encounter: Payer: Self-pay | Admitting: Family Medicine

## 2018-06-16 IMAGING — CR DG CHEST 2V
1 series · 2 of 2 positions shown · non-contrast
Comparison: 03/26/2016 CXR

CLINICAL DATA: Left-sided chest pressure starting this morning.

EXAM:
CHEST  2 VIEW

[Series 1: dg chest 2 view · 0.14mm/px · 2 of 2 slices shown]
[im 1/2]
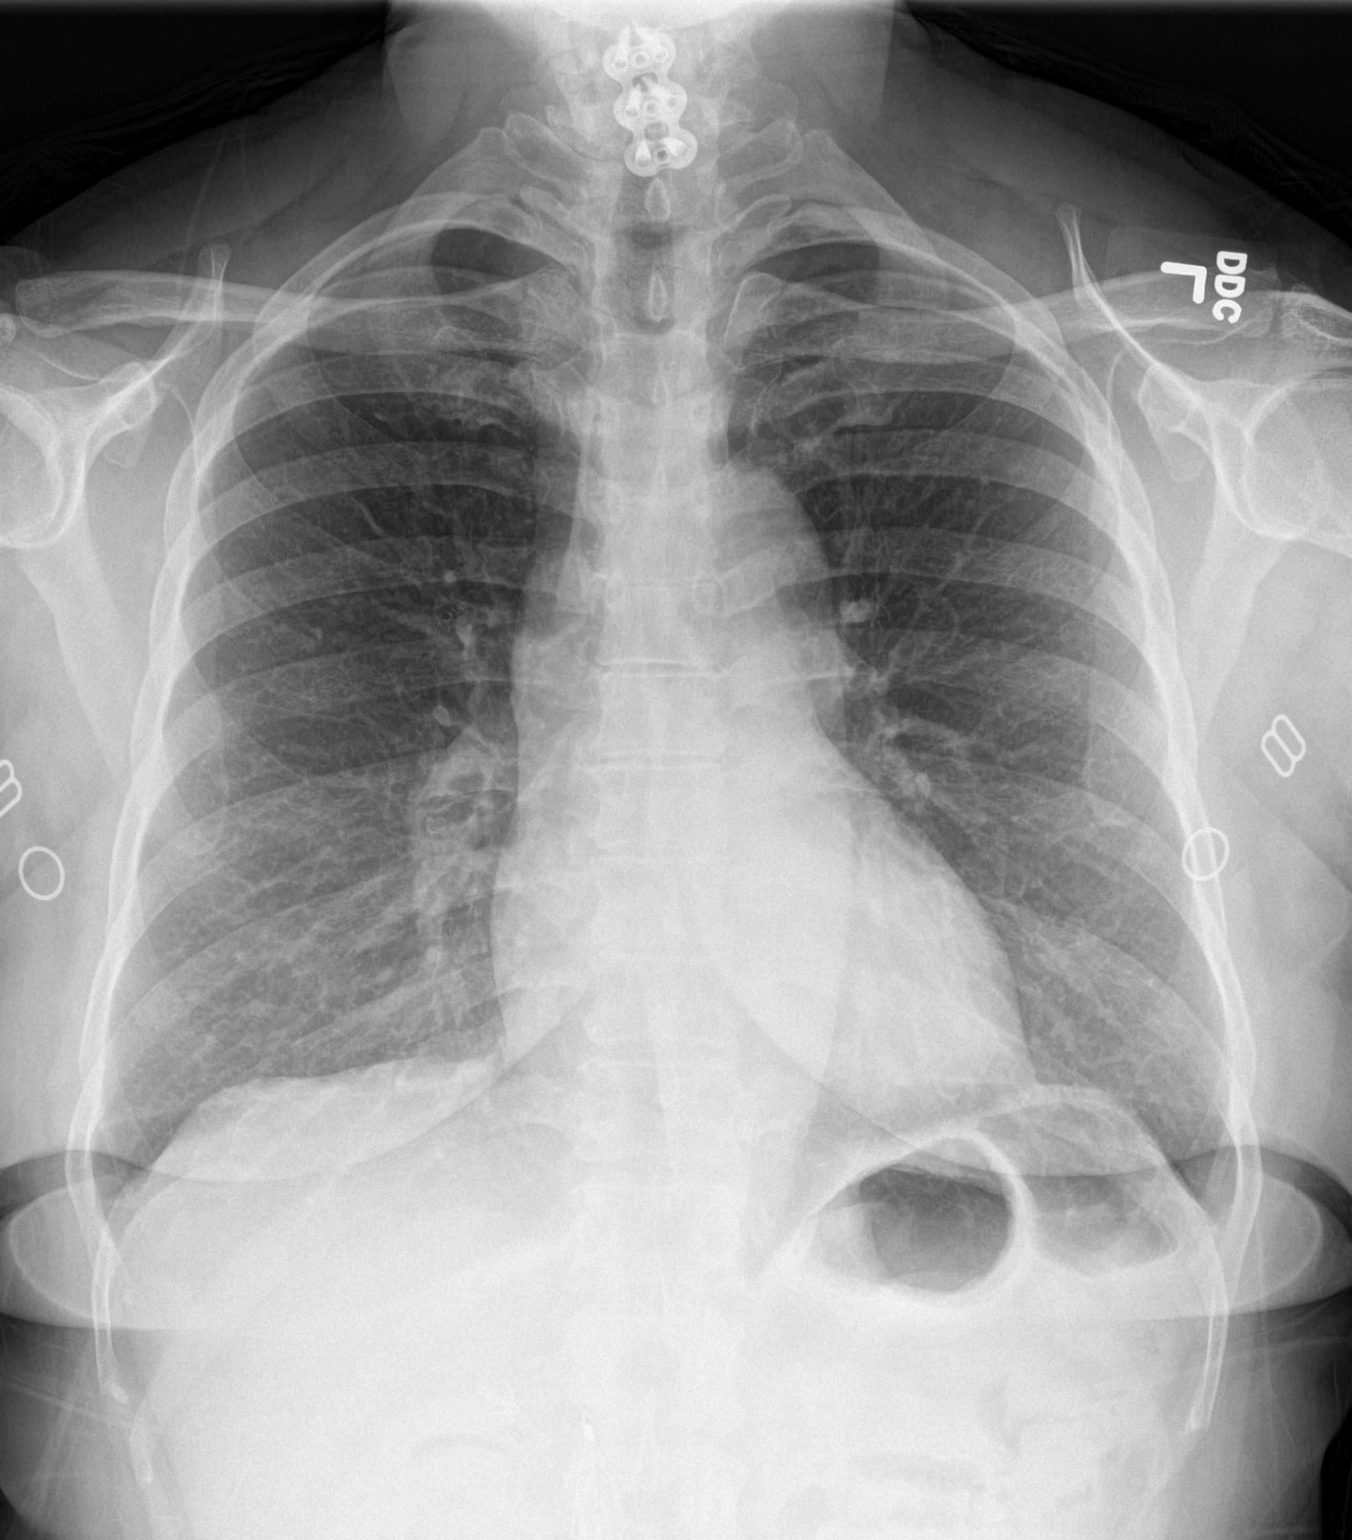
[im 2/2]
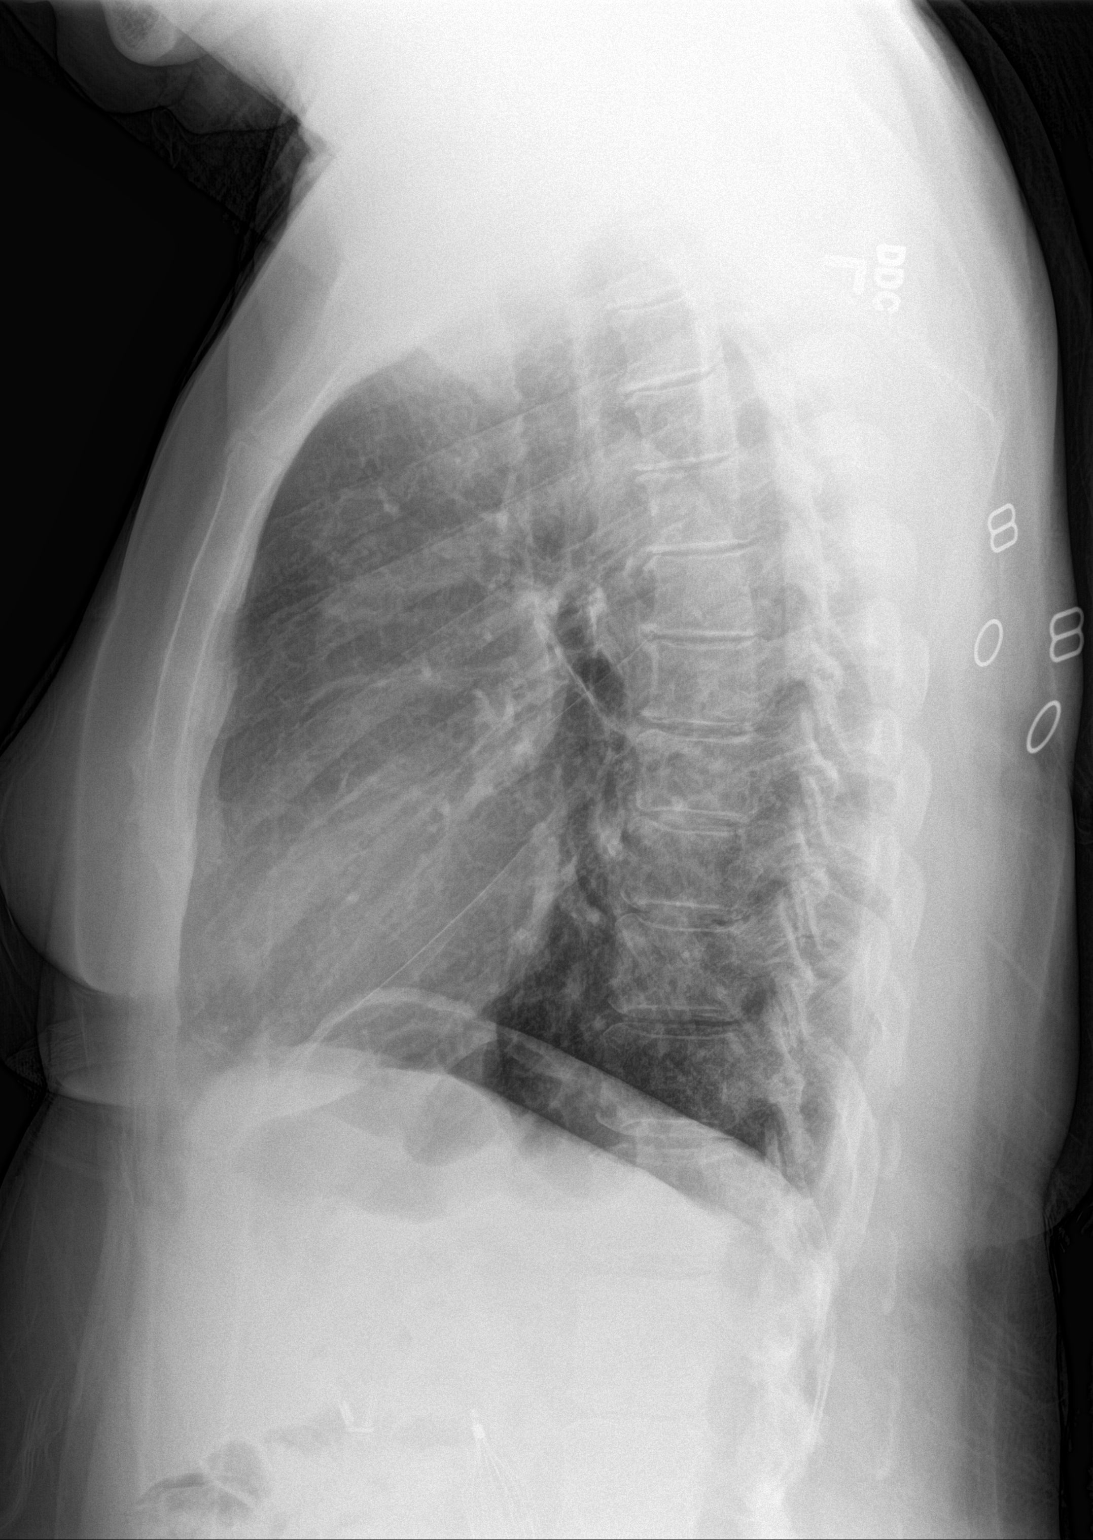

[2 of 2 positions shown; findings below may reference images not displayed]

FINDINGS: The patient is status post low anterior cervical disc fusion with
hardware in place. The heart is top-normal in size. There is slight
uncoiling of the thoracic aorta without aneurysm no pneumonic
consolidation, effusion or pneumothorax. No pulmonary edema. Slight
disc space narrowing of the upper to mid thoracic spine. No acute
osseous abnormality. IVC filter and cholecystectomy clips are seen
in the right upper quadrant.
IMPRESSION: No active cardiopulmonary disease.

Status post IVC filter, cholecystectomy and ACDF of the lower
cervical spine.

## 2018-06-16 IMAGING — CT CT ANGIO CHEST
2 of 6 series · 18 of 46 positions shown · IV contrast (APPLIED)
Comparison: Radiographs earlier this day.  Chest CT 09/19/2014

CLINICAL DATA: Left-sided chest pain, onset today.

EXAM:
CT ANGIOGRAPHY CHEST WITH CONTRAST
TECHNIQUE: Multidetector CT imaging of the chest was performed using the
standard protocol during bolus administration of intravenous
contrast. Multiplanar CT image reconstructions and MIPs were
obtained to evaluate the vascular anatomy.
CONTRAST:  75 cc Isovue 370 IV

[Series 5: thins · axial · 0.72mm/px · z∈[-515,-263]mm · 16 of 276 slices shown]
[im 12/276  lung]
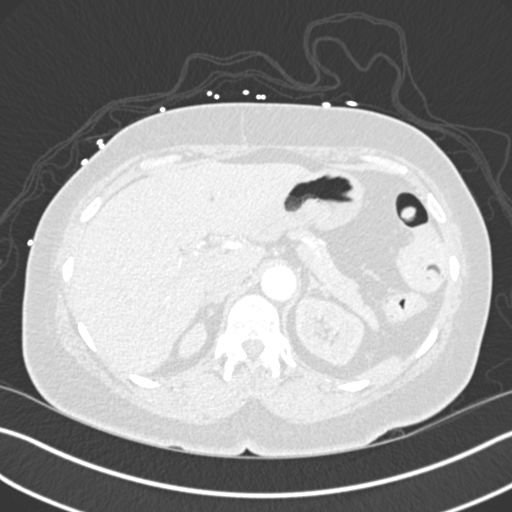
[im 36/276  soft-tissue]
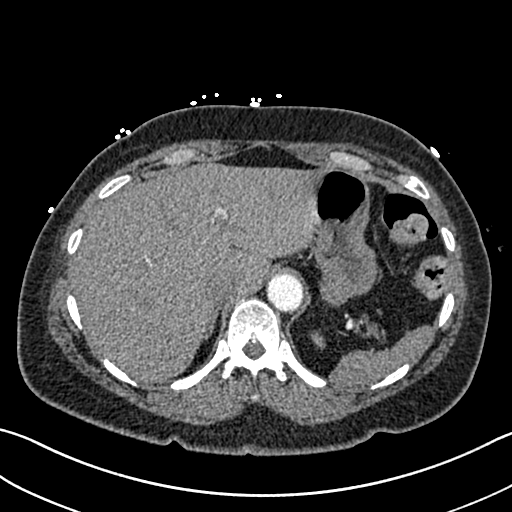
[im 48/276  lung]
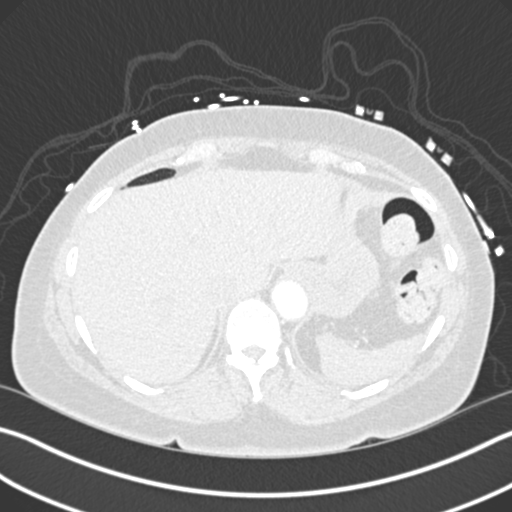
[im 60/276  soft-tissue]
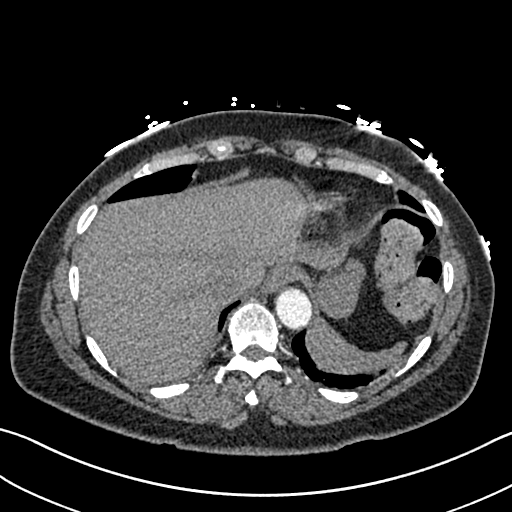
[im 84/276  lung]
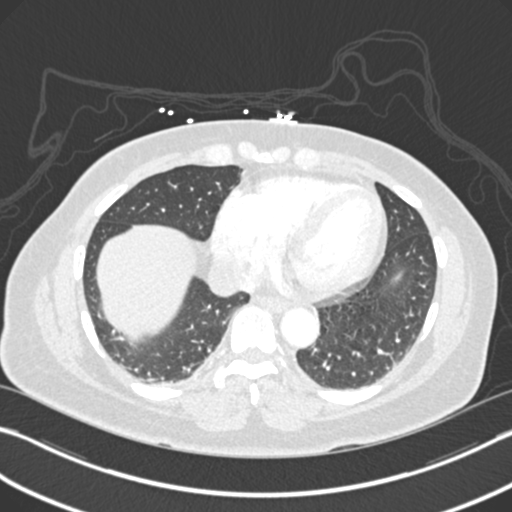
[im 96/276  soft-tissue]
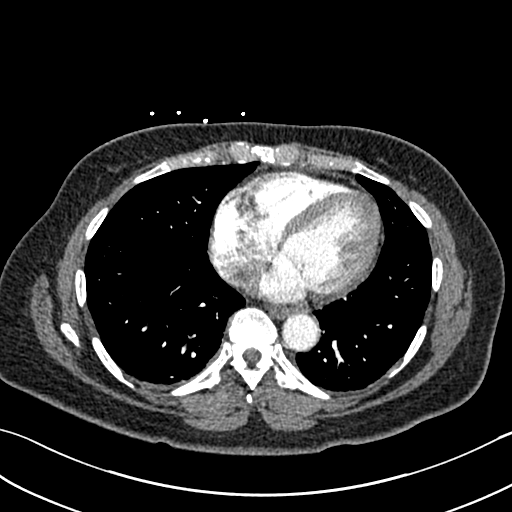
[im 108/276  lung]
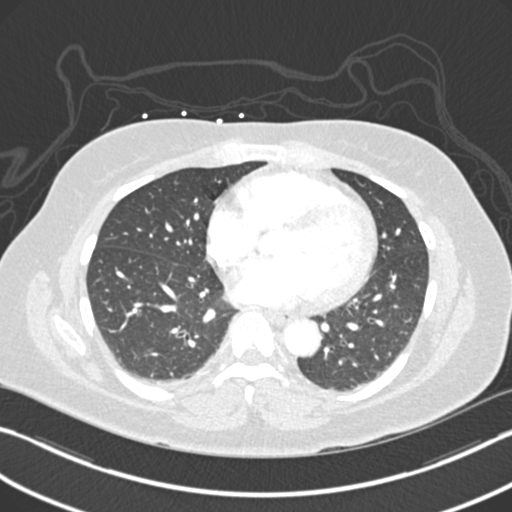
[im 132/276  soft-tissue]
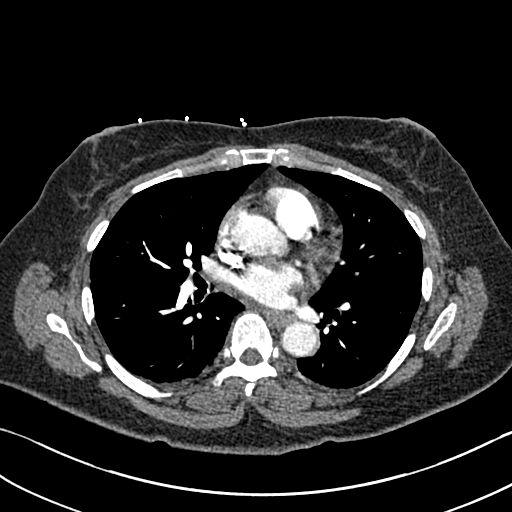
[im 144/276  lung]
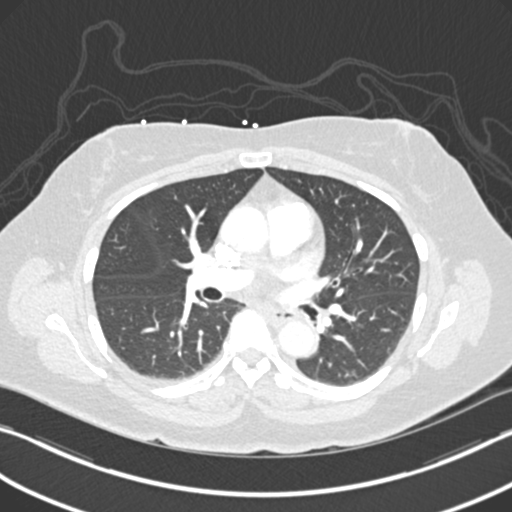
[im 168/276  soft-tissue]
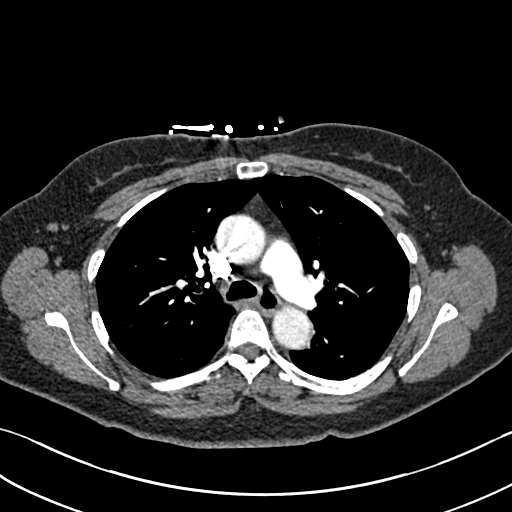
[im 180/276  lung]
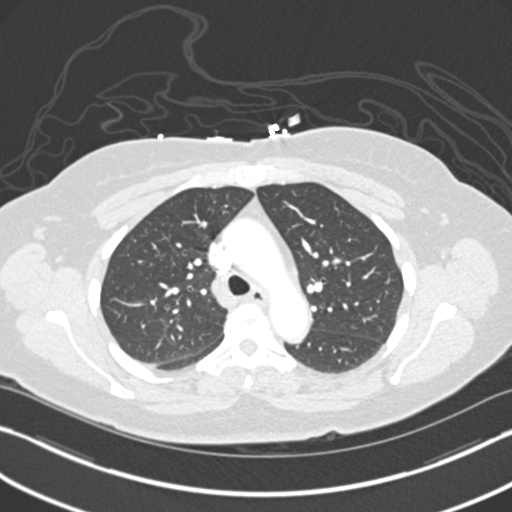
[im 192/276  soft-tissue]
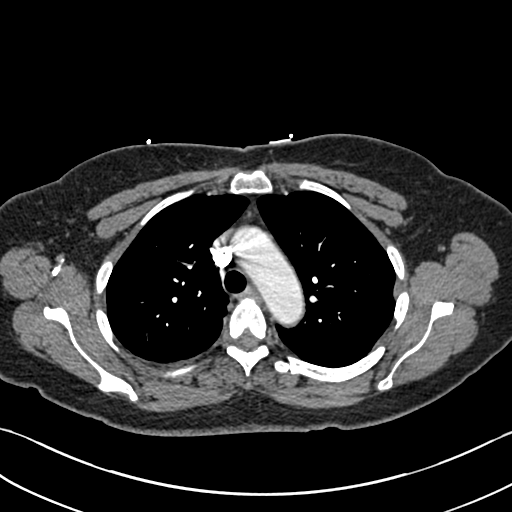
[im 216/276  lung]
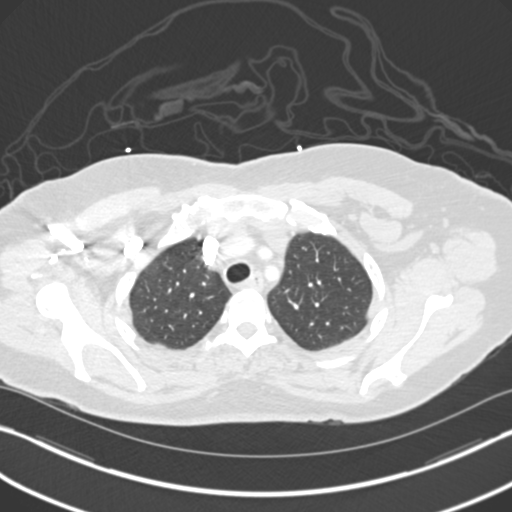
[im 228/276  soft-tissue]
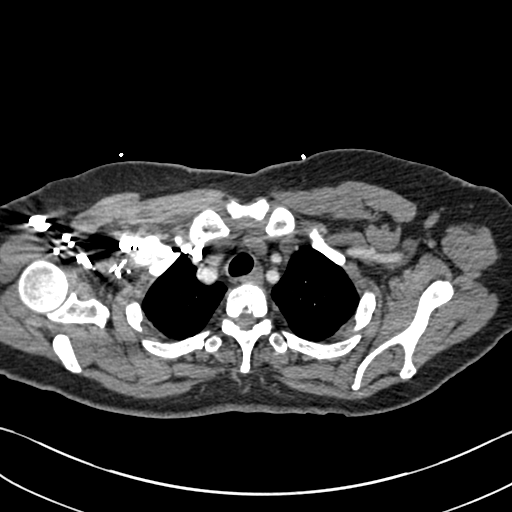
[im 240/276  lung]
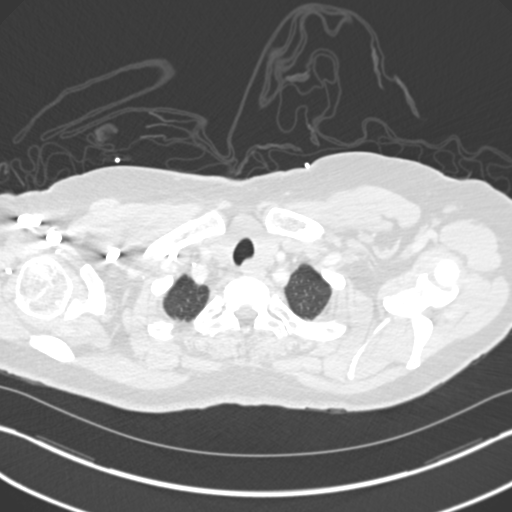
[im 264/276  soft-tissue]
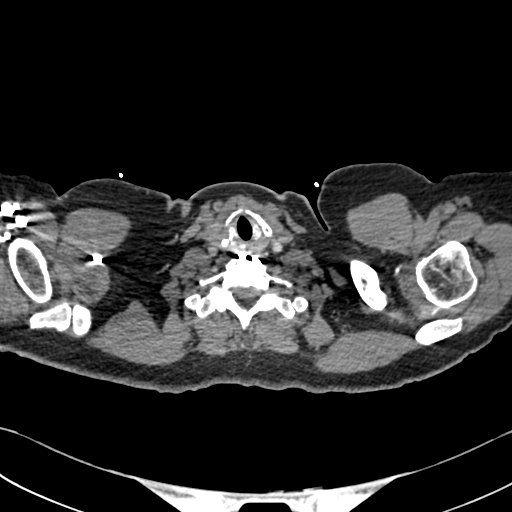

[Series 7: coronal mpr · coronal · 0.58mm/px · 2 of 76 slices shown]
[im 26/76  soft-tissue]
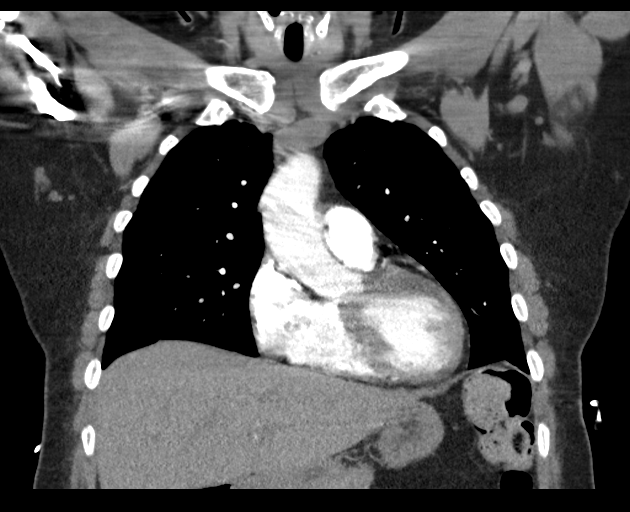
[im 51/76  soft-tissue]
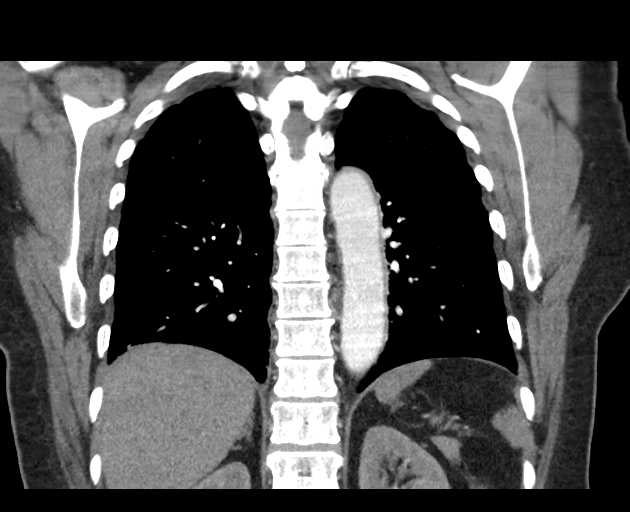

[18 of 46 positions shown; findings below may reference images not displayed]

FINDINGS: Cardiovascular: The previous small bilateral subsegmental filling
defects in the pulmonary arteries have resolved. No new or
progressive filling defects to suggest acute pulmonary embolus. No
thoracic aortic dissection or aneurysm. Normal heart size. No
pericardial fluid.

Mediastinum/Nodes: No enlarged mediastinal, hilar, or axillary lymph
nodes. Thyroid gland, trachea, and esophagus demonstrate no
significant findings.

Lungs/Pleura: Mild dependent atelectasis within both lung bases. No
confluent airspace disease. No pulmonary edema, suspicious nodule or
pulmonary mass. Pleural fluid.

Upper Abdomen: No acute abnormality.  Postcholecystectomy.

Musculoskeletal: There are no acute or suspicious osseous
abnormalities. Postsurgical change in the lower cervical spine is
partially included.

Review of the MIP images confirms the above findings.
IMPRESSION: 1. No acute pulmonary embolus. Previous small pulmonary emboli have
resolved.
2. No acute abnormality.

## 2018-06-23 IMAGING — CT CT ANGIO CHEST
2 of 7 series · 19 of 46 positions shown · IV contrast (APPLIED)
Comparison: None.

CLINICAL DATA: Cough for 3-4 days with left-sided chest pain,
initial encounter

EXAM:
CT ANGIOGRAPHY CHEST WITH CONTRAST
TECHNIQUE: Multidetector CT imaging of the chest was performed using the
standard protocol during bolus administration of intravenous
contrast. Multiplanar CT image reconstructions and MIPs were
obtained to evaluate the vascular anatomy.
CONTRAST:  75 mL Isovue 370.

[Series 5: thins · axial · 0.77mm/px · z∈[-659,-393]mm · 17 of 298 slices shown]
[im 16/298  lung]
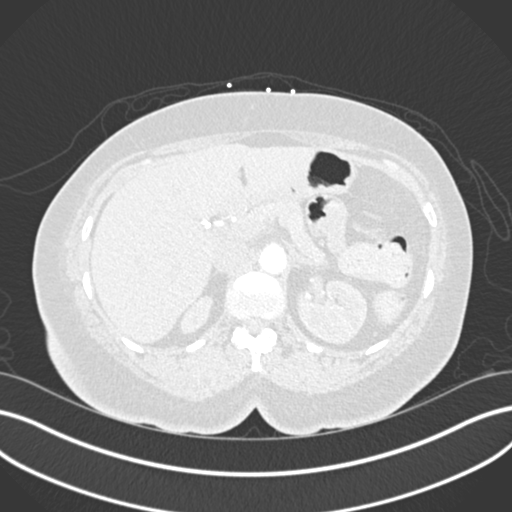
[im 32/298  soft-tissue]
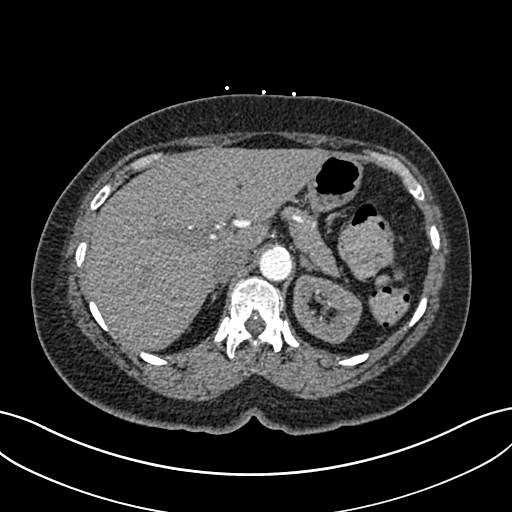
[im 47/298  lung]
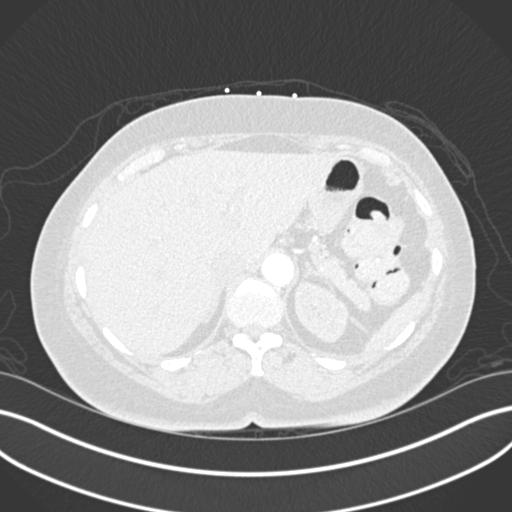
[im 63/298  soft-tissue]
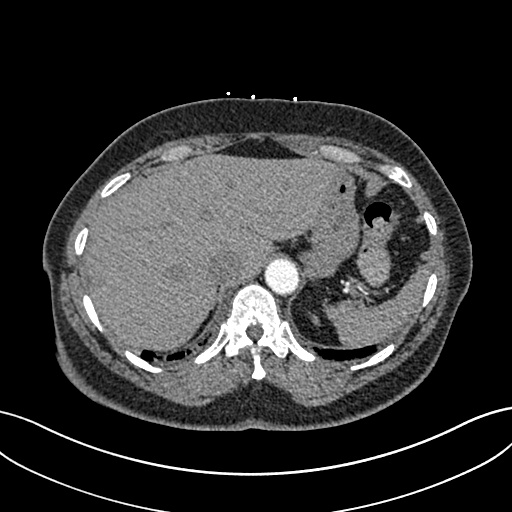
[im 79/298  lung]
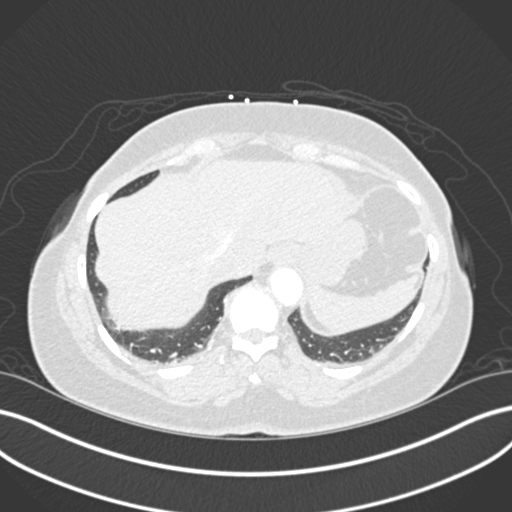
[im 94/298  soft-tissue]
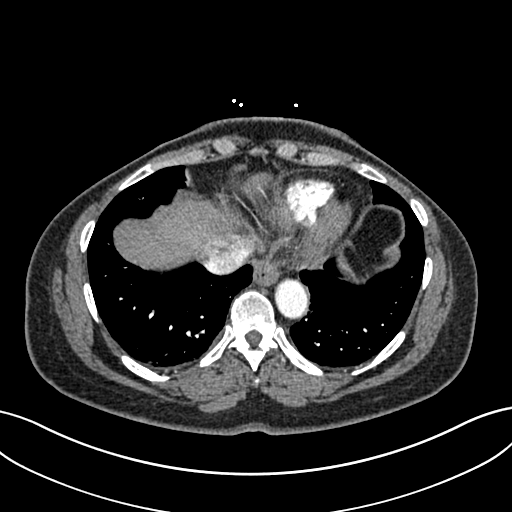
[im 110/298  lung]
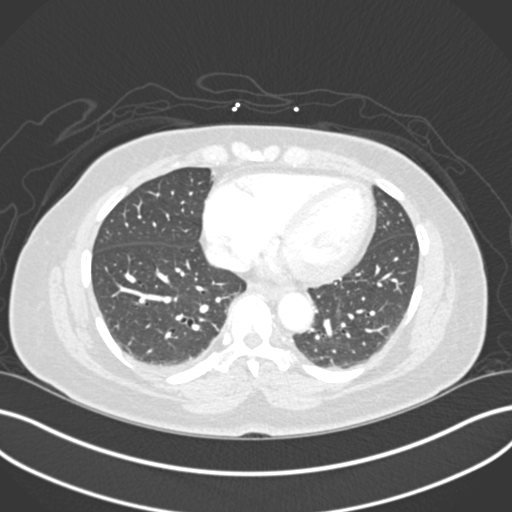
[im 126/298  soft-tissue]
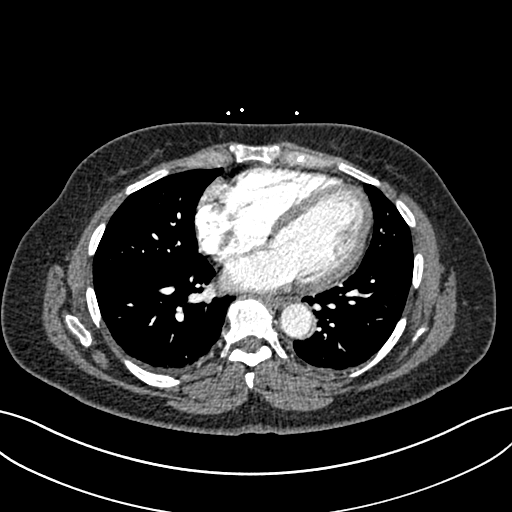
[im 157/298  lung]
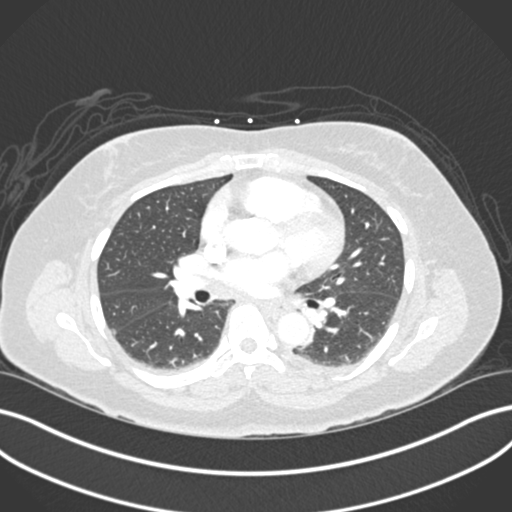
[im 172/298  soft-tissue]
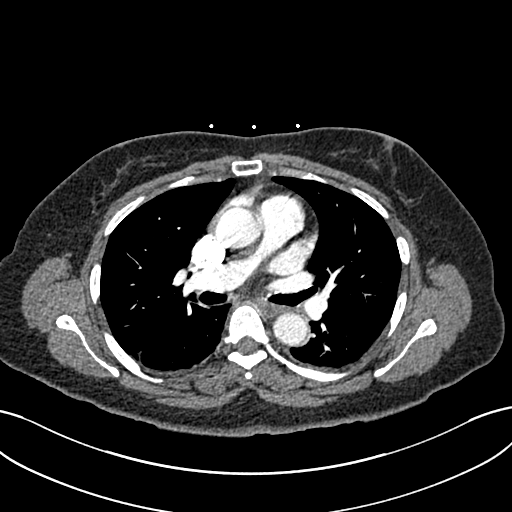
[im 188/298  lung]
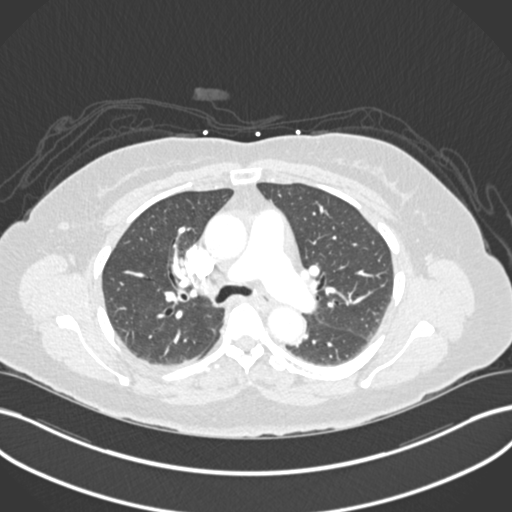
[im 204/298  soft-tissue]
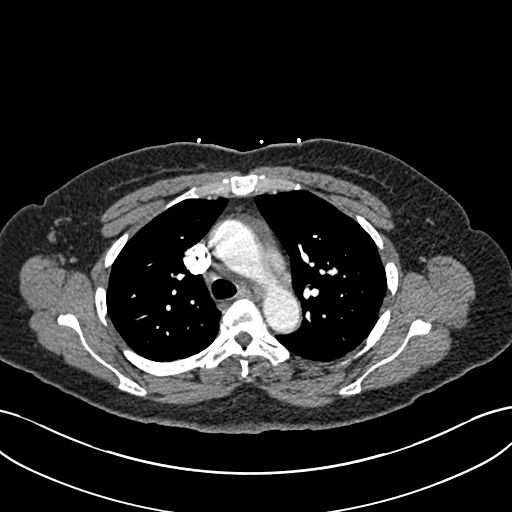
[im 219/298  lung]
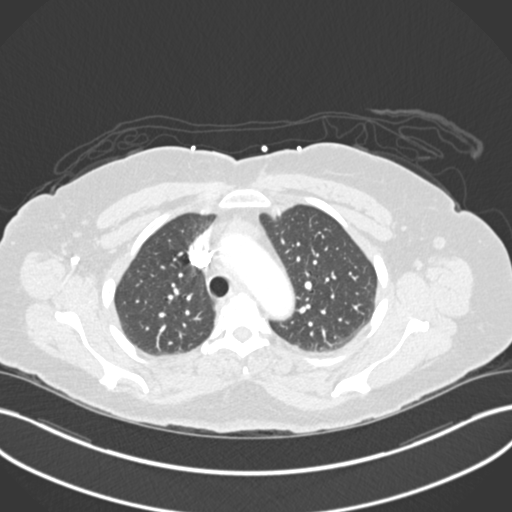
[im 235/298  soft-tissue]
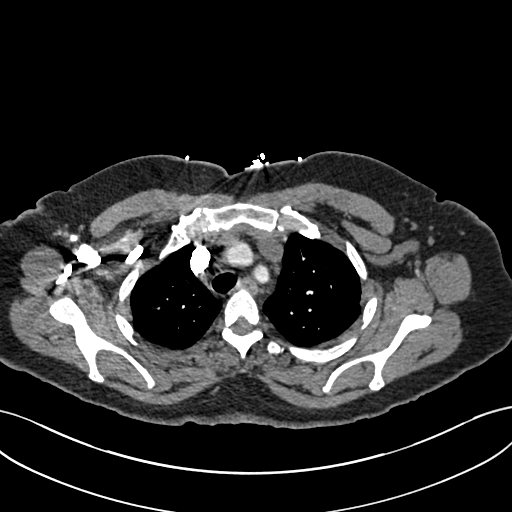
[im 251/298  lung]
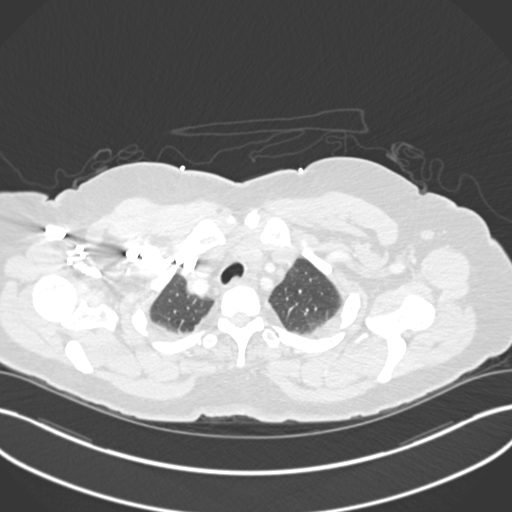
[im 266/298  soft-tissue]
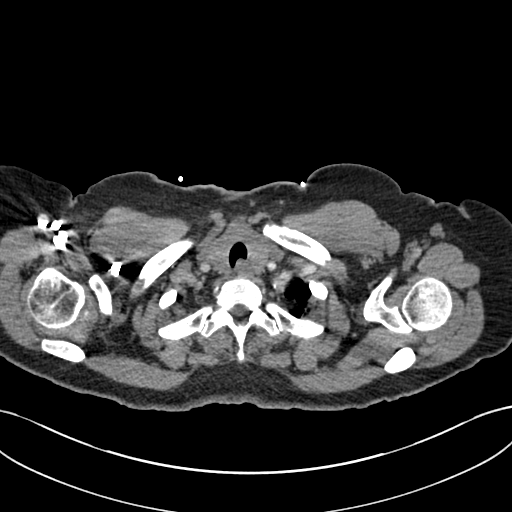
[im 282/298  lung]
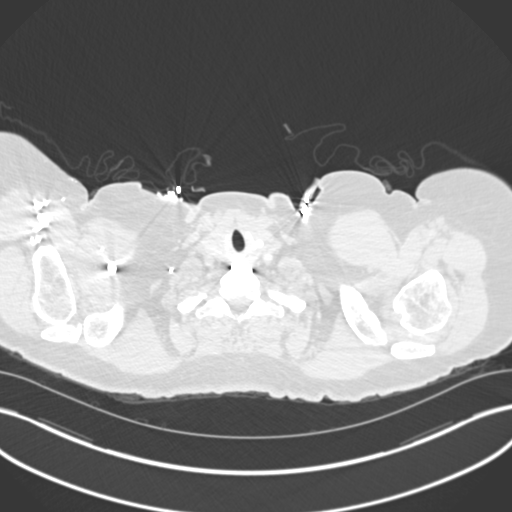

[Series 7: coronal mpr · coronal · 0.58mm/px · 2 of 76 slices shown]
[im 26/76  soft-tissue]
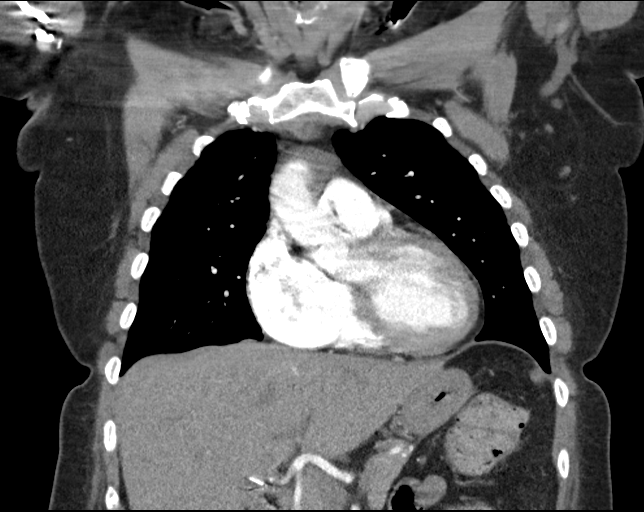
[im 51/76  soft-tissue]
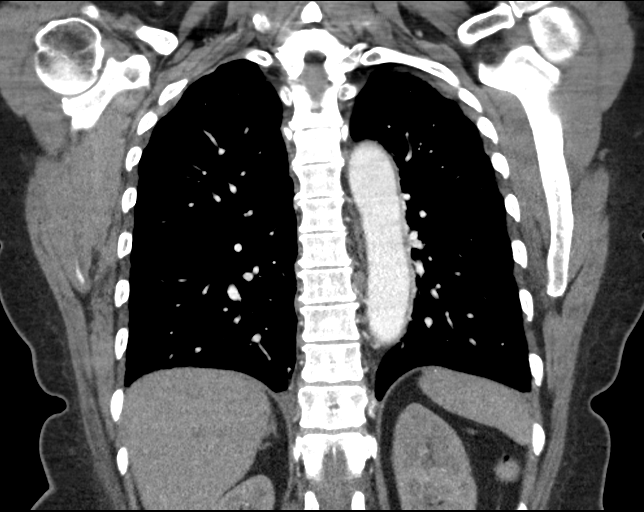

[19 of 46 positions shown; findings below may reference images not displayed]

FINDINGS: Cardiovascular: Thoracic aorta demonstrates a normal branching
pattern. No significant atherosclerotic changes are seen. No
significant coronary calcifications are noted. Cardiac structures
are otherwise within normal limits. Pulmonary artery demonstrates a
normal branching pattern without evidence of pulmonary embolism.

Mediastinum/Nodes: The thoracic inlet is within normal limits. No
significant hilar or mediastinal adenopathy is identified. No
axillary adenopathy is seen.

Lungs/Pleura: Lungs are clear. No pleural effusion or pneumothorax.

Upper Abdomen: Gallbladder has been surgically removed. The
remainder of the upper abdomen is within normal limits.

Musculoskeletal: No acute abnormality noted.

Review of the MIP images confirms the above findings.
IMPRESSION: No acute abnormality noted.

## 2018-06-23 IMAGING — CR DG CHEST 2V
2 series · 2 of 2 positions shown · non-contrast
Comparison: Chest x-ray and chest CT scan August 08, 2014

CLINICAL DATA: Cough and mid and lateral chest pain. History of
pneumonia, pulmonary embolism, previous MI. Remote history of
smoking.

EXAM:
CHEST  2 VIEW

[chest pa]
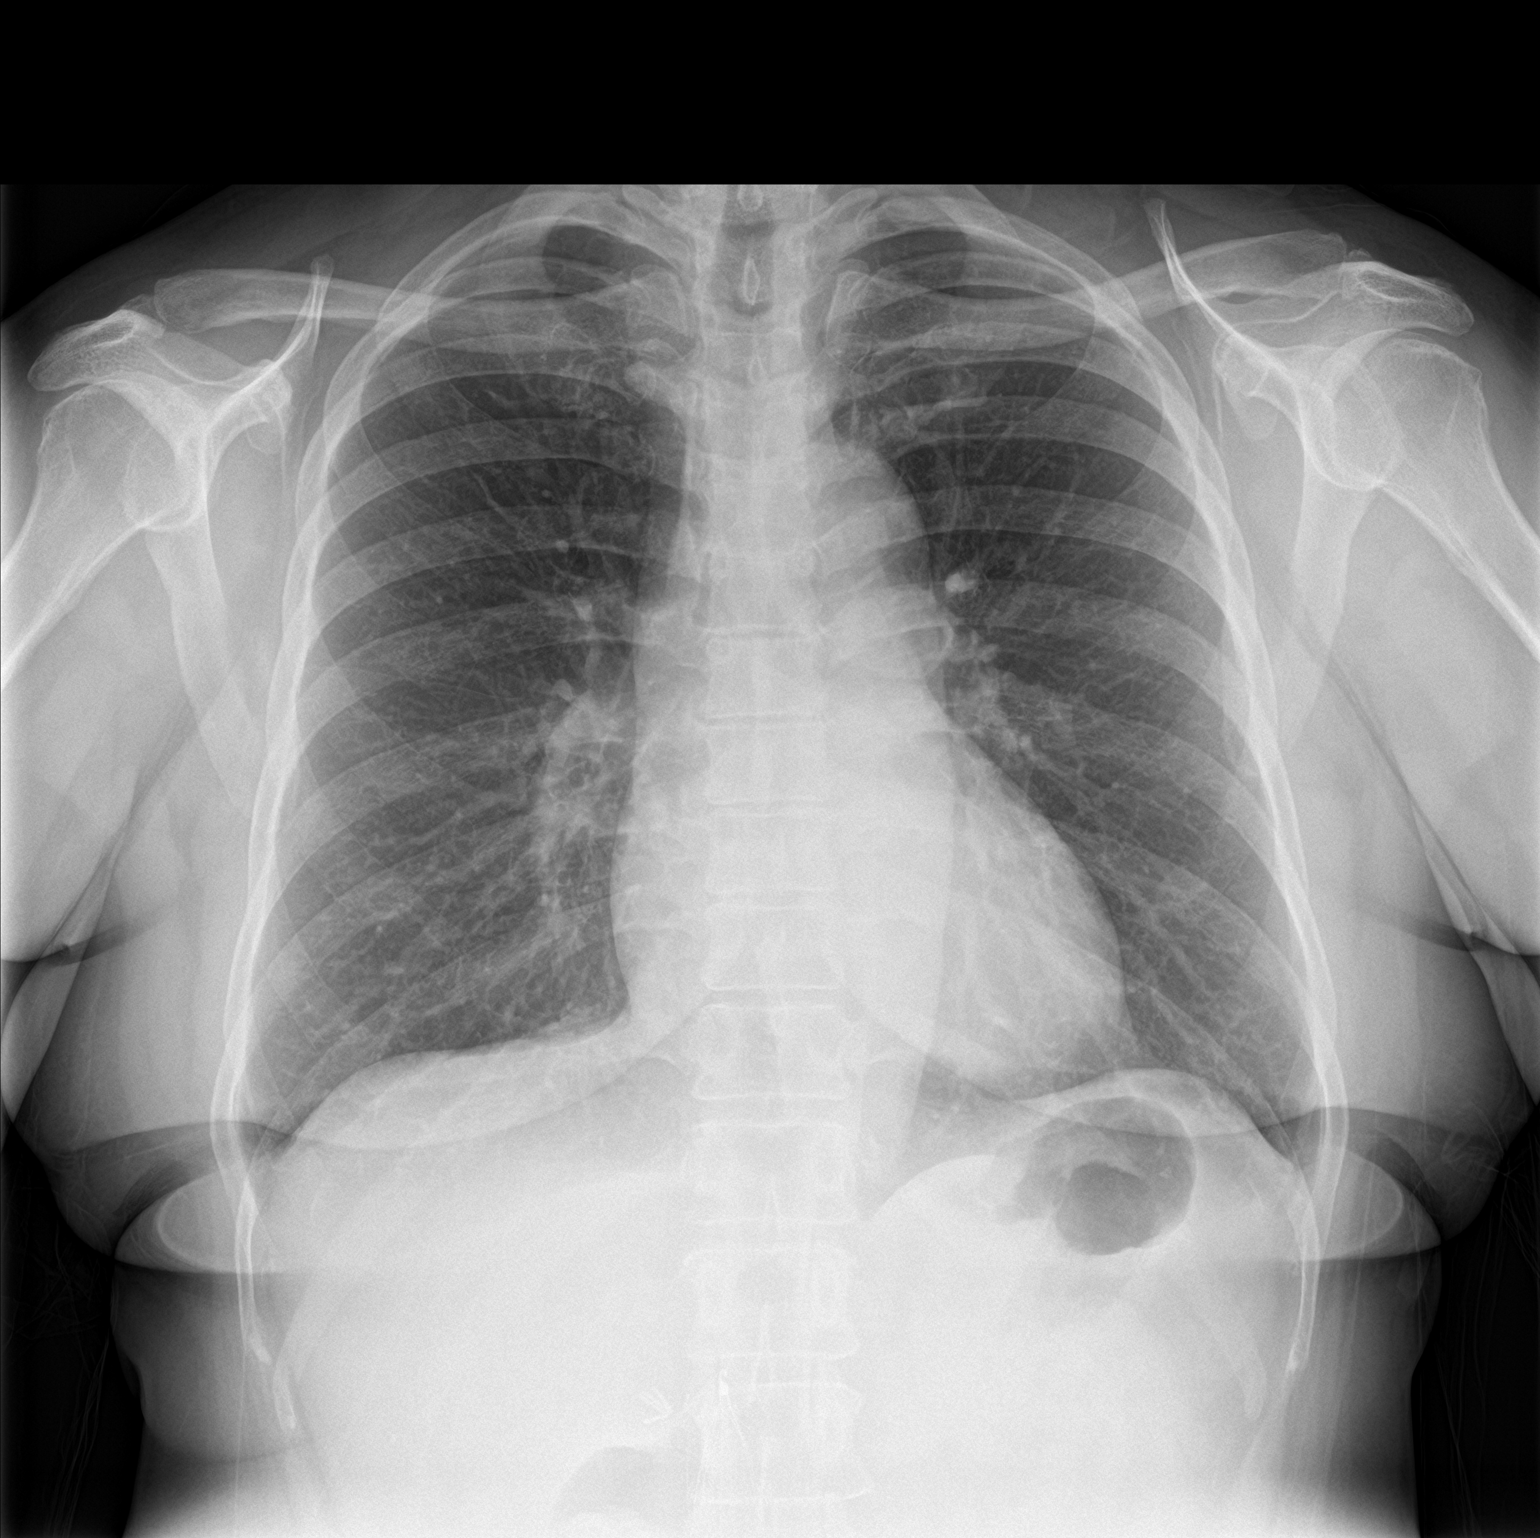

[chest lat]
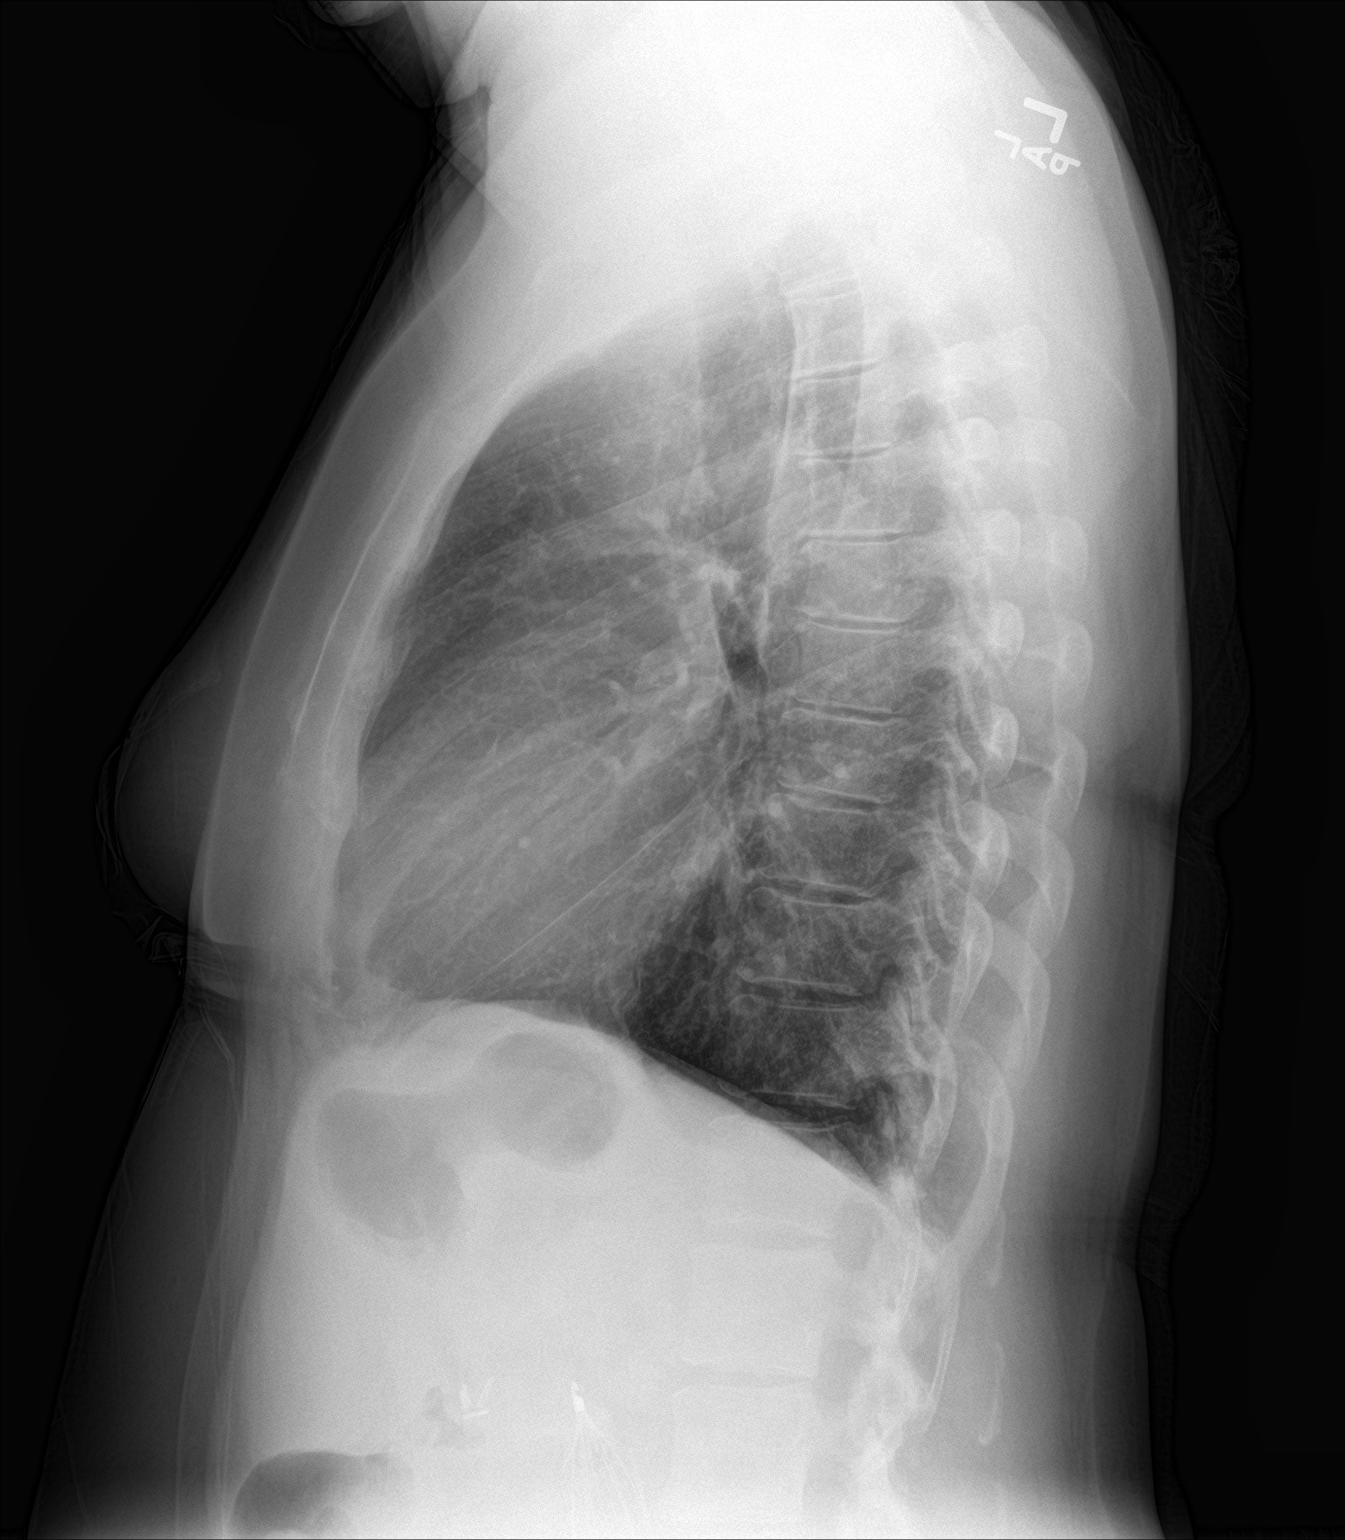

[2 of 2 positions shown; findings below may reference images not displayed]

FINDINGS: The lungs are mildly hyperinflated. There is no focal infiltrate.
There is no pleural effusion. The heart and pulmonary vascularity
are normal. The mediastinum is normal in width. The bony thorax
exhibits no acute abnormality. The patient has undergone previous
lower anterior cervical fusion.
IMPRESSION: Mild hyperinflation consistent with reactive airway disease or COPD.
No pneumonia nor other acute cardiopulmonary abnormality.

## 2018-06-27 ENCOUNTER — Other Ambulatory Visit: Payer: Self-pay | Admitting: Psychiatry

## 2018-06-27 DIAGNOSIS — F250 Schizoaffective disorder, bipolar type: Secondary | ICD-10-CM

## 2018-06-29 ENCOUNTER — Other Ambulatory Visit: Payer: Self-pay | Admitting: Psychiatry

## 2018-06-29 DIAGNOSIS — F99 Mental disorder, not otherwise specified: Principal | ICD-10-CM

## 2018-06-29 DIAGNOSIS — F5105 Insomnia due to other mental disorder: Secondary | ICD-10-CM

## 2018-06-29 MED ORDER — RAMELTEON 8 MG PO TABS: 8.0000 mg | ORAL_TABLET | Freq: Every day | ORAL | 0 refills | Status: DC

## 2018-06-29 NOTE — Telephone Encounter (Signed)
Will sent Rozerem for 30 days . Pt needs appointment .

## 2018-06-30 ENCOUNTER — Other Ambulatory Visit: Payer: Self-pay | Admitting: Family Medicine

## 2018-06-30 DIAGNOSIS — E063 Autoimmune thyroiditis: Secondary | ICD-10-CM

## 2018-07-01 ENCOUNTER — Other Ambulatory Visit: Payer: Self-pay

## 2018-07-01 ENCOUNTER — Encounter: Payer: Self-pay | Admitting: Psychiatry

## 2018-07-01 ENCOUNTER — Ambulatory Visit (INDEPENDENT_AMBULATORY_CARE_PROVIDER_SITE_OTHER): Payer: Medicare HMO | Admitting: Psychiatry

## 2018-07-01 VITALS — BP 132/78 | HR 94 | Temp 98.8°F | Wt 216.0 lb

## 2018-07-01 DIAGNOSIS — F5105 Insomnia due to other mental disorder: Secondary | ICD-10-CM | POA: Diagnosis not present

## 2018-07-01 DIAGNOSIS — F99 Mental disorder, not otherwise specified: Secondary | ICD-10-CM

## 2018-07-01 DIAGNOSIS — R69 Illness, unspecified: Secondary | ICD-10-CM | POA: Diagnosis not present

## 2018-07-01 DIAGNOSIS — F411 Generalized anxiety disorder: Secondary | ICD-10-CM | POA: Diagnosis not present

## 2018-07-01 DIAGNOSIS — F250 Schizoaffective disorder, bipolar type: Secondary | ICD-10-CM

## 2018-07-01 MED ORDER — OLANZAPINE 10 MG PO TABS: 10.0000 mg | ORAL_TABLET | Freq: Every day | ORAL | 1 refills | Status: DC

## 2018-07-01 MED ORDER — GABAPENTIN 600 MG PO TABS: 600.0000 mg | ORAL_TABLET | Freq: Two times a day (BID) | ORAL | 1 refills | Status: DC

## 2018-07-01 MED ORDER — ESCITALOPRAM OXALATE 20 MG PO TABS: 20.0000 mg | ORAL_TABLET | Freq: Every day | ORAL | 1 refills | Status: DC

## 2018-07-01 MED ORDER — RAMELTEON 8 MG PO TABS: 8.0000 mg | ORAL_TABLET | Freq: Every day | ORAL | 1 refills | Status: DC

## 2018-07-01 NOTE — Progress Notes (Signed)
[facility] MD OP Progress Note  07/01/2018 1:40 PM NYA MONDS  MRN:  [id number]  Chief Complaint: ' I am here for follow up." Chief Complaint    Follow-up; Medication Refill     HPI: Valerie Vazquez is a 62 yr old 1 female who is divorced, has a history of schizoaffective disorder, anxiety disorder, presented to the clinic today for a follow-up visit.  Patient today reports she has been doing well with regards to her mood symptoms.  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  She reports she has already completed some classes and test.  She feels positive she can do this.  She reports she also has been sleeping well.  Patient denies any hallucinations.  She denies any mood lability.  She denies any side effects to the medications.  She reports she has an upcoming surgery for a cyst in her lower abdomen.  It is going to be an outpatient procedure.  She is hoping it will be okay and is hoping that her boyfriend will help her with transportation during that time.  Patient reports she is looking forward to visiting her son prior to Thanksgiving.  Her son lives  in [place].  Patient denies any other concerns today. Visit Diagnosis:    ICD-10-CM   1. Schizoaffective disorder, bipolar type ([place]) F25.0 escitalopram (LEXAPRO) 20 MG tablet    OLANZapine (ZYPREXA) 10 MG tablet   in partial remission  2. Insomnia due to other mental disorder F51.05 ramelteon (ROZEREM) 8 MG tablet   F99    improved  3. GAD (generalized anxiety disorder) F41.1 gabapentin (NEURONTIN) 600 MG tablet   improved    Past Psychiatric History: Have reviewed past psychiatric history from my progress note on 12/08/2017.  Past Medical History:  Past Medical History:  Diagnosis Date  . Acute right-sided low back pain with right-sided sciatica 10/23/2017  . Anemia   . Bilateral renal cysts   . CAD (coronary artery disease) 09/28/2016  . Chest pain 10/14/2015  . Chronic back pain    "upper and lower" (09/19/2014)  . Chronic shoulder pain (Location of Primary Source of Pain) (Left) 01/29/2016  . Colitis 04/08/2016  . Coronary vasospasm ([place]) 12/09/2014  . Depression    "major" (09/19/2014)  . DVT (deep venous thrombosis) ([place])   . Esophageal spasm   . Essential hypertension 10/28/2013  . Gallstones   . Gastritis   . Gastroenteritis   . GERD (gastroesophageal reflux disease)   . Headache    "couple /month lately" (09/19/2014)  . Heart disease 12/09/2016  . High blood pressure   . High cholesterol   . History of blood transfusion 1985   related to hysterectomy  . History of nuclear stress test    Myoview 1/19:  EF 65, no ischemia or scar  . History of pulmonary embolus (PE) 09/19/2014  . 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 Rule out Paget's disease of the bone with blood work.   . Hyperlipidemia 10/28/2013  . Hypertension   . IBS (irritable bowel syndrome)   . Lumbar central spinal stenosis (moderate at L4-5; mild at L2-3 and L3-4) 01/29/2016  . Migraine    "a few times/yr" (09/19/2014)  . Myocardial infarction ([place]) 10/2010 X 3   "while hospitalized"  . Neck pain 01/29/2016  . Osteoarthritis    "qwhere; mostly around my joints" (09/19/2014)  . Pleural effusion, bilateral 07/31/2015  . Pneumonia 04/2014  . PTSD (post-traumatic stress  disorder)   . Pulmonary embolism ([place]) 04/2001; 09/19/2014   "after gallbladder OR; "  . Schizoaffective disorder ([place])   . Schizoaffective disorder, depressive type ([place]) 08/28/2016  . Sleep apnea    "mild" (09/19/2014)  . Snoring    a. sleep study 5/16: No OSA  . SOB (shortness of breath) 07/31/2015  . Spinal stenosis   . Spondylosis   . Suicide attempt ([place]) 08/27/2016  . Type II diabetes mellitus ([place])    "dx'd in 2007; lost weight; no RX for ~ 4 yr now" (09/19/2014)  . Wheezing 02/25/2017    Past Surgical History:  Procedure Laterality Date  . ABDOMINAL HYSTERECTOMY  1985  . ANTERIOR CERVICAL  DECOMP/DISCECTOMY FUSION  10/2012   in [place] C5-C7   . APPENDECTOMY  1985  . BACK SURGERY    . BREAST CYST EXCISION Right   . BREAST EXCISIONAL BIOPSY Right YRS AGO   NEG  . CARDIAC CATHETERIZATION   2000's; 2009  . CHOLECYSTECTOMY  2002  . COLONOSCOPY WITH PROPOFOL N/A 12/12/2016   Procedure: COLONOSCOPY WITH PROPOFOL;  Surgeon: [name], MD;  Location: [facility] ENDOSCOPY;  Service: Endoscopy;  Laterality: N/A;  . COLONOSCOPY WITH PROPOFOL N/A 02/17/2017   Procedure: COLONOSCOPY WITH PROPOFOL;  Surgeon: [name], MD;  Location: [facility] ENDOSCOPY;  Service: Gastroenterology;  Laterality: N/A;  . ESOPHAGOGASTRODUODENOSCOPY (EGD) WITH PROPOFOL N/A 02/17/2017   Procedure: ESOPHAGOGASTRODUODENOSCOPY (EGD) WITH PROPOFOL;  Surgeon: [name], MD;  Location: [facility] ENDOSCOPY;  Service: Gastroenterology;  Laterality: N/A;  . EXCISION/RELEASE BURSA HIP Right 12/1984  . HERNIA REPAIR  1985  . LEFT HEART CATHETERIZATION WITH CORONARY ANGIOGRAM N/A 12/09/2014   Procedure: LEFT HEART CATHETERIZATION WITH CORONARY ANGIOGRAM;  Surgeon: Burnell Blanks, MD;  Location: [facility] CATH LAB;  Service: Cardiovascular;  Laterality: N/A;  . TONSILLECTOMY AND ADENOIDECTOMY  ~ 1968  . TOTAL HIP ARTHROPLASTY Left 04-06-2014  . UMBILICAL HERNIA REPAIR  1985  . VENA CAVA FILTER PLACEMENT  03/2014    Family Psychiatric History: I have reviewed family psychiatric history from my progress note on 12/08/2017  Family History:  Family History  Problem Relation Age of Onset  . Stroke Mother        Deceased  . Lung cancer Father        Deceased  . Healthy Daughter   . Healthy Son        x 2  . Other Son        Suicide  . Cancer Brother   . Heart attack Neg Hx     Social History: Reviewed social history from my progress note on 12/08/2017. Social History   Socioeconomic History  . Marital status: Divorced    Spouse name: Not on file  . Number of children: Not on file  . Years of education: Not on file  . Highest  education level: Not on file  Occupational History  . Not on file  Social Needs  . Financial resource strain: Not hard at all  . Food insecurity:    Worry: Never true    Inability: Never true  . Transportation needs:    Medical: No    Non-medical: No  Tobacco Use  . Smoking status: Former Smoker    Packs/day: 1.50    Years: 10.00    Pack years: 15.00    Types: Cigarettes    Last attempt to quit: 04/12/1979    Years since quitting: 39.2  . Smokeless tobacco: Never Used  Substance and Sexual Activity  .  Alcohol use: No    Alcohol/week: 0.0 standard drinks  . Drug use: No  . Sexual activity: Not Currently  Lifestyle  . Physical activity:    Days per week: 0 days    Minutes per session: Not on file  . Stress: Not on file  Relationships  . Social connections:    Talks on phone: Not on file    Gets together: Not on file    Attends religious service: Not on file    Active member of club or organization: Not on file    Attends meetings of clubs or organizations: Not on file    Relationship status: Not on file  Other Topics Concern  . Not on file  Social History Narrative   Lives with fiancee in an apartment on the first floor.  Has 3 grown children.  One child passed away.  On disability 12-10-1994 - 2000, 2007 - current.     Education: some college.    Allergies:  Allergies  Allergen Reactions  . Abilify [Aripiprazole] Other (See Comments)    Jerking movement, slow motor skills  . Augmentin [Amoxicillin-Pot Clavulanate] Diarrhea and Nausea And Vomiting  . Bactrim [Sulfamethoxazole-Trimethoprim] Diarrhea  . Ceftin [Cefuroxime Axetil] Diarrhea  . Coconut Oil     Rash   . Diclofenac Other (See Comments)     Muscle Pain  . Dye Fdc Red [Red Dye]     "hair dye"  . Indocin [Indomethacin] Other (See Comments)    Migraines   . Linaclotide Diarrhea  . Lisinopril Diarrhea and Other (See Comments)    Other reaction(s): Dizziness, Vomiting  . Morphine Other (See Comments)     Chest Tightness  . Morphine And Related Other (See Comments)    Chest tightness   . Simvastatin Other (See Comments)    Muscle Pain  . Sulfa Antibiotics Diarrhea  . Sulfamethoxazole-Trimethoprim Diarrhea  . Wellbutrin [Bupropion] Other (See Comments)    Jerking movement Jerking movement Slurred speech  . Zetia [Ezetimibe]     Diarrhea   . Oxycodone-Acetaminophen Itching  . Quetiapine Fumarate Palpitations  . Quetiapine Fumarate Palpitations    Metabolic Disorder Labs: Lab Results  Component Value Date   HGBA1C 6.5 (H) 03/19/2018   MPG 140 03/19/2018   MPG 123 08/29/2016   No results found for: PROLACTIN Lab Results  Component Value Date   CHOL 304 (H) 08/21/2017   TRIG 190.0 (H) 08/21/2017   HDL 38.70 (L) 08/21/2017   CHOLHDL 8 08/21/2017   VLDL 38.0 08/21/2017   LDLCALC 227 (H) 08/21/2017   LDLCALC 196 (H) 08/29/2016   Lab Results  Component Value Date   TSH 1.95 11/05/2017   TSH 7.72 (H) 08/21/2017    Therapeutic Level Labs: No results found for: LITHIUM No results found for: VALPROATE No components found for:  CBMZ  Current Medications: Current Outpatient Medications  Medication Sig Dispense Refill  . carvedilol (COREG) 3.125 MG tablet Take 1 tablet (3.125 mg total) by mouth 2 (two) times daily with a meal. 60 tablet 11  . clindamycin (CLEOCIN) 150 MG capsule Take 3 capsules (450 mg total) by mouth 3 (three) times daily. 63 capsule 0  . cyclobenzaprine (FLEXERIL) 10 MG tablet TAKE 1 TABLET BY MOUTH THREE TIMES A DAY AS NEEDED FOR MUSCLE SPASMS 30 tablet 0  . diclofenac sodium (VOLTAREN) 1 % GEL Apply 2 g topically 4 (four) times daily. Right arm/shoulder arthritis 1 Tube 2  . dicyclomine (BENTYL) 20 MG tablet TAKE 1 TABLET BY MOUTH  3 TIMES A DAY AS NEEDED FOR SPASMS 270 tablet 0  . diphenhydrAMINE (BENADRYL) 25 MG tablet Take 1 tablet (25 mg total) by mouth every 6 (six) hours. 12 tablet 0  . ELIQUIS 5 MG TABS tablet TAKE 1 TABLET (5 MG TOTAL) BY MOUTH 2  (TWO) TIMES DAILY. 60 tablet 5  . escitalopram (LEXAPRO) 20 MG tablet Take 1 tablet (20 mg total) by mouth daily. 90 tablet 1  . gabapentin (NEURONTIN) 600 MG tablet Take 1-1.5 tablets (600-900 mg total) by mouth 2 (two) times daily. Take 1 tablet in the AM and 1.5 tablet at bedtime 225 tablet 1  . isosorbide mononitrate (IMDUR) 30 MG 24 hr tablet TAKE 1.5 TABLETS (45 MG TOTAL) BY MOUTH DAILY. 45 tablet 6  . levothyroxine (SYNTHROID, LEVOTHROID) 25 MCG tablet TAKE 1 TABLET BY MOUTH BEFORE BREAKFAST 2-3 HOURS BEFORE TAKING ANY OTHER MEDICATION OR SUPPLEMENT 90 tablet 0  . methylPREDNISolone (MEDROL DOSEPAK) 4 MG TBPK tablet Take according to pack instructions 21 tablet 0  . Multiple Vitamins-Minerals (SUPER THERA VITE M) TABS Take by mouth.    . neomycin-polymyxin b-dexamethasone (MAXITROL) 3.5-10000-0.1 OINT APPLY A SMALL AMOUNT IN RIGHT EYE 2 TIMES A DAY FOR 14 DAYS  1  . OLANZapine (ZYPREXA) 10 MG tablet Take 1 tablet (10 mg total) by mouth at bedtime. 90 tablet 1  . ondansetron (ZOFRAN) 4 MG tablet Take 1 tablet (4 mg total) by mouth 2 (two) times daily as needed for nausea or vomiting. 30 tablet 0  . pantoprazole (PROTONIX) 40 MG tablet TAKE 1 TABLET BY MOUTH EVERY DAY 60 tablet 0  . pravastatin (PRAVACHOL) 10 MG tablet Take 1 tablet (10 mg total) by mouth at bedtime. 90 tablet 1  . PROCARDIA 10 MG capsule TAKE 1 CAPSULE BY MOUTH 2 TIMES DAILY. TAKE 30 MINS BEFORE MEALS UP TO 1X PER DAY 180 capsule 1  . ramelteon (ROZEREM) 8 MG tablet Take 1 tablet (8 mg total) by mouth at bedtime. 90 tablet 1  . sitaGLIPtin (JANUVIA) 50 MG tablet Take 1 tablet (50 mg total) by mouth daily. 90 tablet 1  . traMADol (ULTRAM) 50 MG tablet Take 1 tablet (50 mg total) by mouth every 8 (eight) hours as needed for moderate pain. 15 tablet 0  . nitroGLYCERIN (NITROSTAT) 0.4 MG SL tablet Place 1 tablet (0.4 mg total) under the tongue every 5 (five) minutes as needed for chest pain. 25 tablet 6   No current  facility-administered medications for this visit.      Musculoskeletal: Strength & Muscle Tone: within normal limits Gait & Station: normal Patient leans: N/A  Psychiatric Specialty Exam: Review of Systems  Psychiatric/Behavioral: Negative for depression and hallucinations. The patient is not nervous/anxious and does not have insomnia.   All other systems reviewed and are negative.   Blood pressure 132/78, pulse 94, temperature 98.8 F (37.1 C), temperature source Oral, weight 216 lb (98 kg).Body mass index is 34.86 kg/m.  General Appearance: Casual  Eye Contact:  Fair  Speech:  Clear and Coherent  Volume:  Normal  Mood:  Euthymic  Affect:  Congruent  Thought Process:  Goal Directed and Descriptions of Associations: Intact  Orientation:  Full (Time, Place, and Person)  Thought Content: Logical   Suicidal Thoughts:  No  Homicidal Thoughts:  No  Memory:  Immediate;   Fair Recent;   Fair Remote;   Fair  Judgement:  Fair  Insight:  Fair  Psychomotor Activity:  Normal  Concentration:  Concentration: Fair  and Attention Span: Fair  Recall:  [employer] of Knowledge: Fair  Language: Fair  Akathisia:  No  Handed:  Right  AIMS (if indicated): 0  Assets:  Communication Skills Desire for Improvement Social Support  ADL's:  Intact  Cognition: WNL  Sleep:  Fair   Screenings: AUDIT     Admission (Discharged) from 08/28/2016 in [facility]  Alcohol Use Disorder Identification Test Final Score (AUDIT)  1    PHQ2-9     Office Visit from confidential encounter on 06/05/2015 Office Visit from confidential encounter on 02/20/2015 Office Visit from 10/28/2013 in [facility]  PHQ-2 Total Score  0  0  0       Assessment and Plan: Marybelle is a 62 year old African-American female who has a history of schizoaffective disorder, anxiety, insomnia, multiple medical problems, presented to the clinic today for a follow-up visit.  Patient  is currently compliant with medications and reports mood symptoms are stable.  Will continue plan as noted below.  Plan For schizoaffective disorder-stable Zyprexa 10 mg p.o. nightly Lexapro 20 mg p.o. daily.  For generalized anxiety disorder Lexapro 20 mg p.o. daily Gabapentin 600 mg during the day and 900 mg at bedtime  For insomnia Rozerem 8 mg po qhs. Benadryl as needed.  Follow up in clinic in 3-4 months or sooner if needed.  More than 50 % of the time was spent for psychoeducation and supportive psychotherapy and care coordination.  This note was generated in part or whole with voice recognition software. Voice recognition is usually quite accurate but there are transcription errors that can and very often do occur. I apologize for any typographical errors that were not detected and corrected.       Ursula Alert, MD 07/02/2018, 10:29 AM

## 2018-07-02 ENCOUNTER — Encounter: Payer: Self-pay | Admitting: Psychiatry

## 2018-07-03 IMAGING — CR DG CHEST 2V
2 series · 2 of 2 positions shown · non-contrast
Comparison: 08/15/2016

CLINICAL DATA: Cough, bronchitis, hypertension and diabetes

EXAM:
CHEST  2 VIEW

[chest pa]
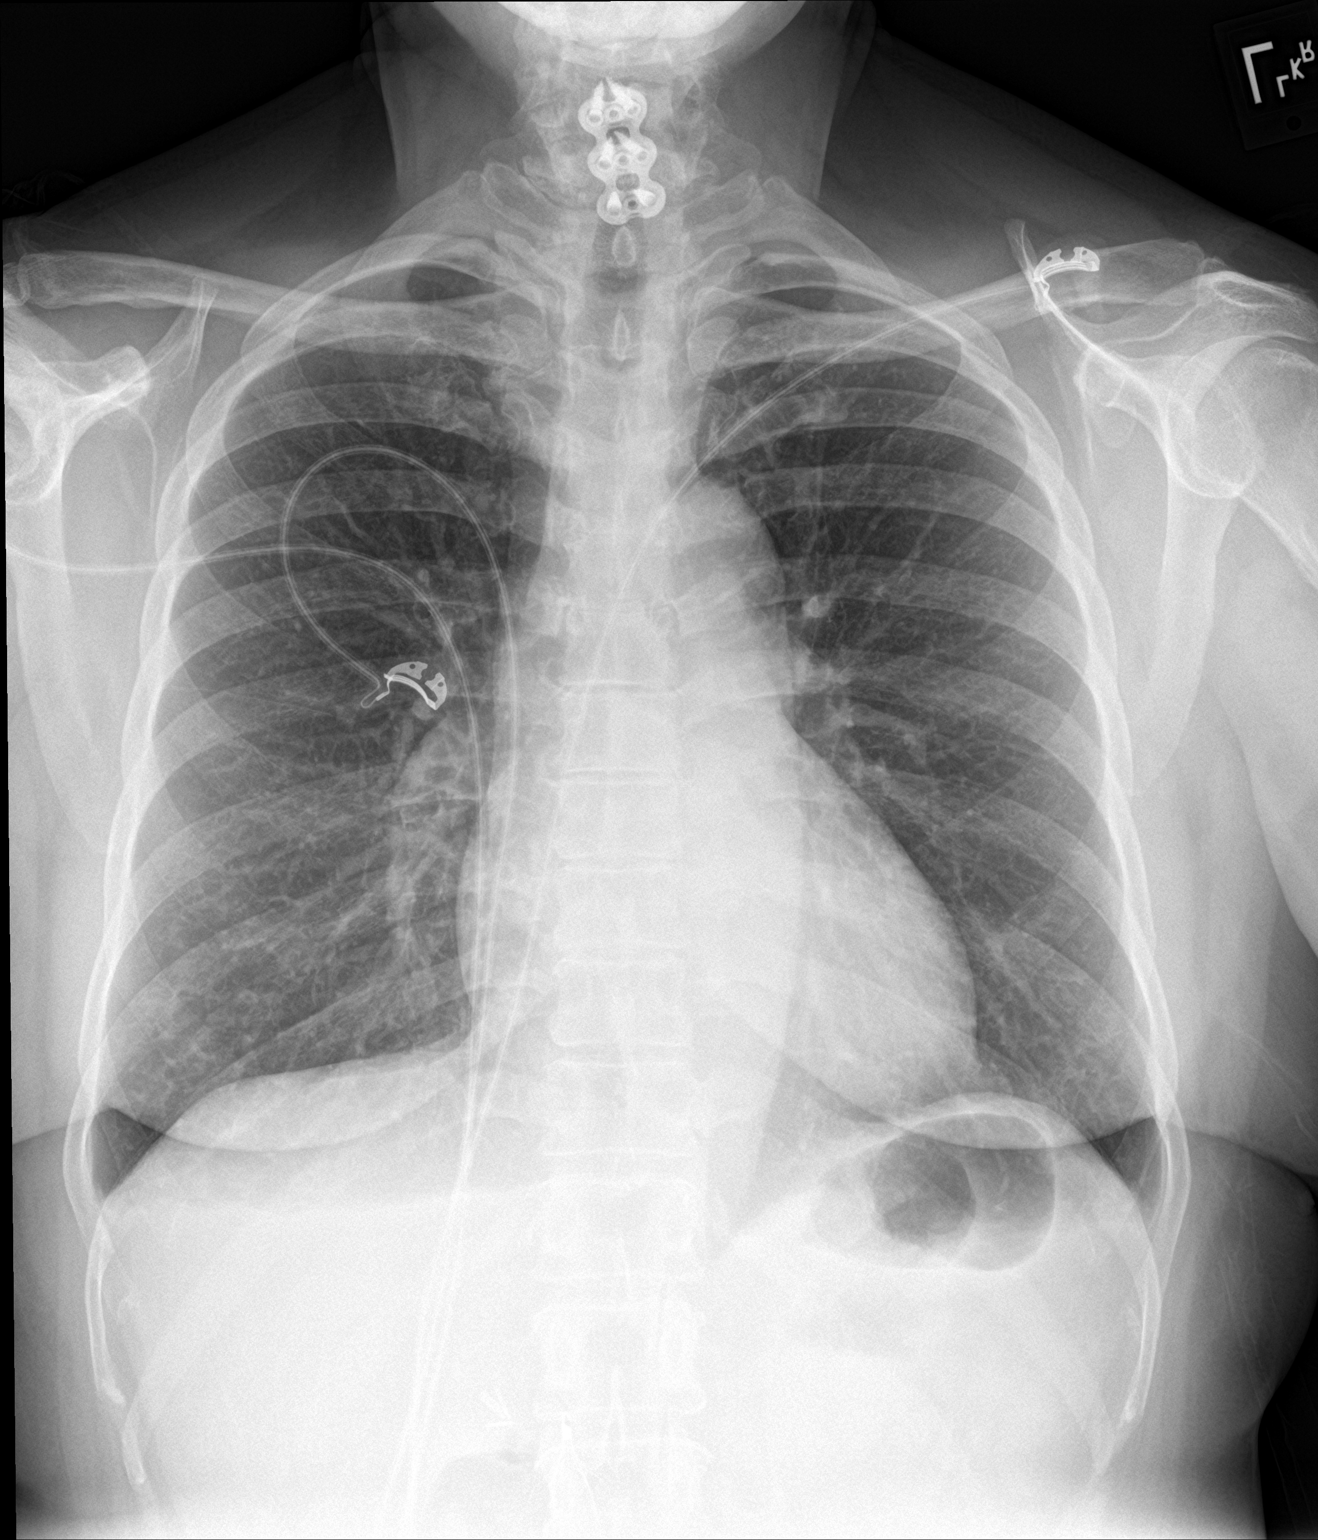

[chest lat]
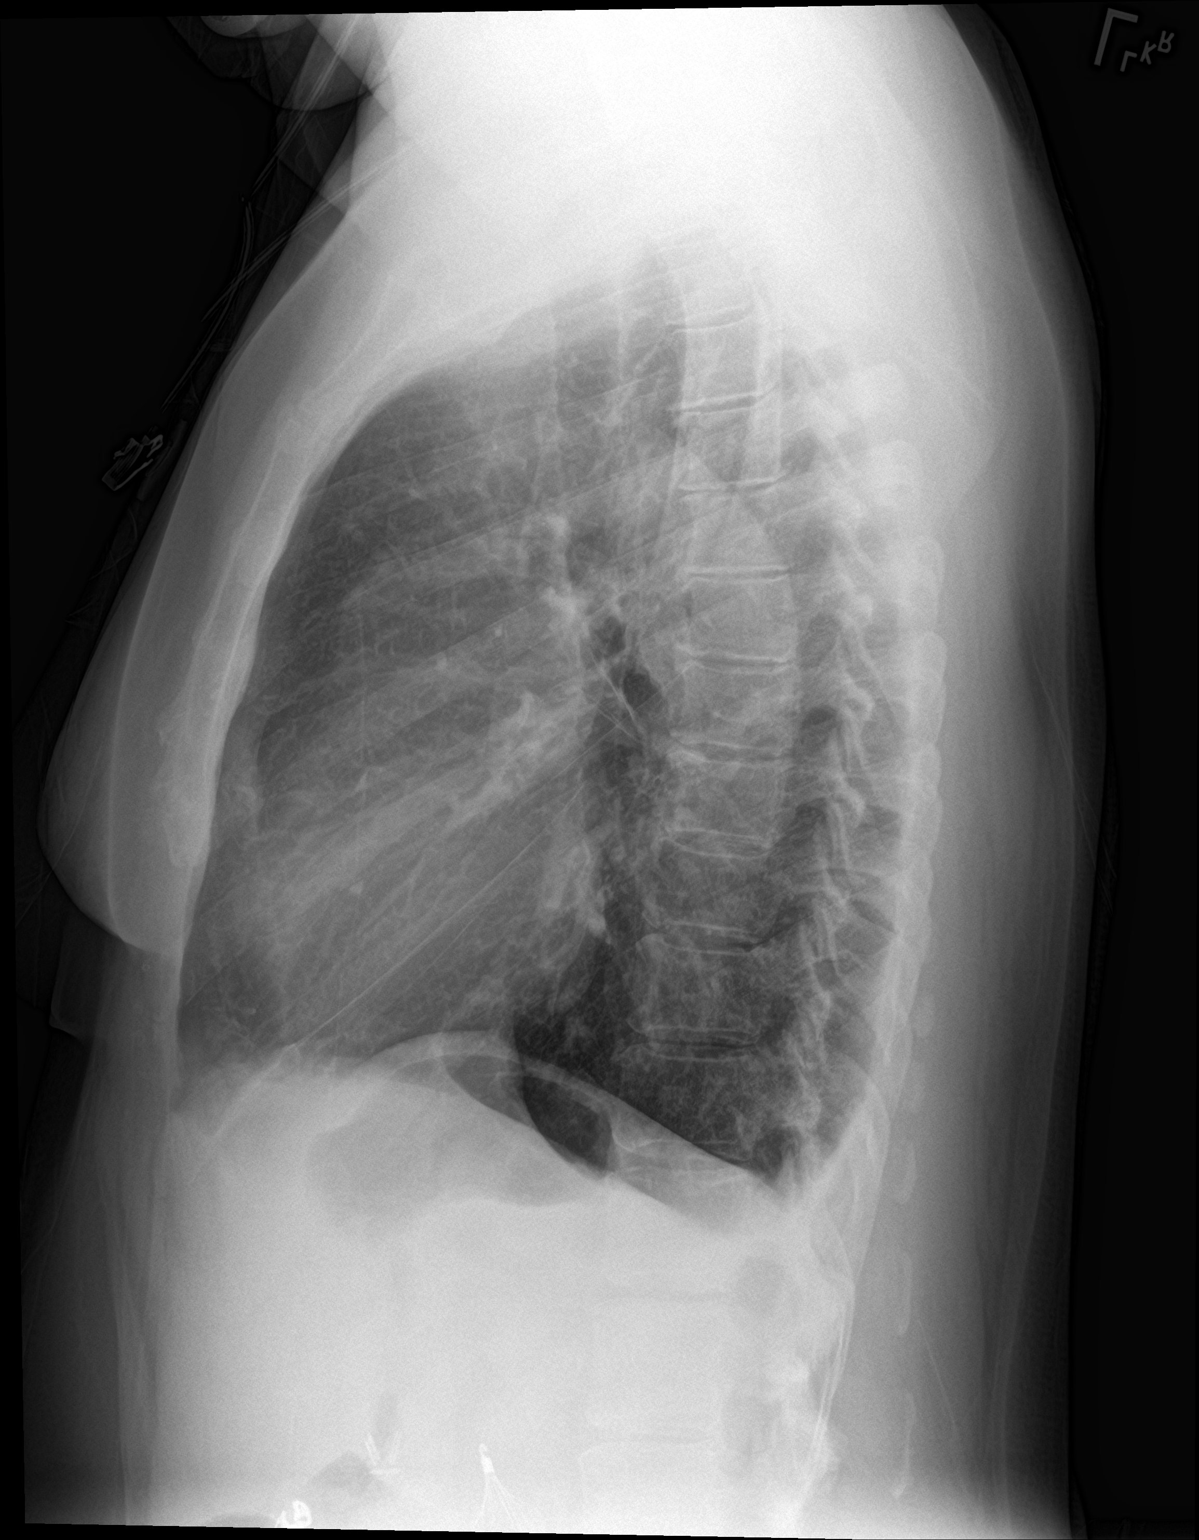

[2 of 2 positions shown; findings below may reference images not displayed]

FINDINGS: The heart size and mediastinal contours are within normal limits.
Both lungs are clear. The visualized skeletal structures are
unremarkable. Lower cervical fusion hardware noted. Remote
cholecystectomy and IVC filter present in the upper abdomen. Trachea
is midline. Monitor leads overlie the chest.
IMPRESSION: No active cardiopulmonary disease.

## 2018-07-05 DIAGNOSIS — L720 Epidermal cyst: Secondary | ICD-10-CM | POA: Diagnosis not present

## 2018-07-08 IMAGING — CR DG CHEST 2V
2 series · 2 of 2 positions shown · non-contrast
Comparison: 08/25/2016; 08/15/2016; chest CT - 08/16/2016

CLINICAL DATA: Productive cough for 1 month. Fever. History of
hypertension and pulmonary embolism. Former smoker.

EXAM:
CHEST  2 VIEW

[chest pa]
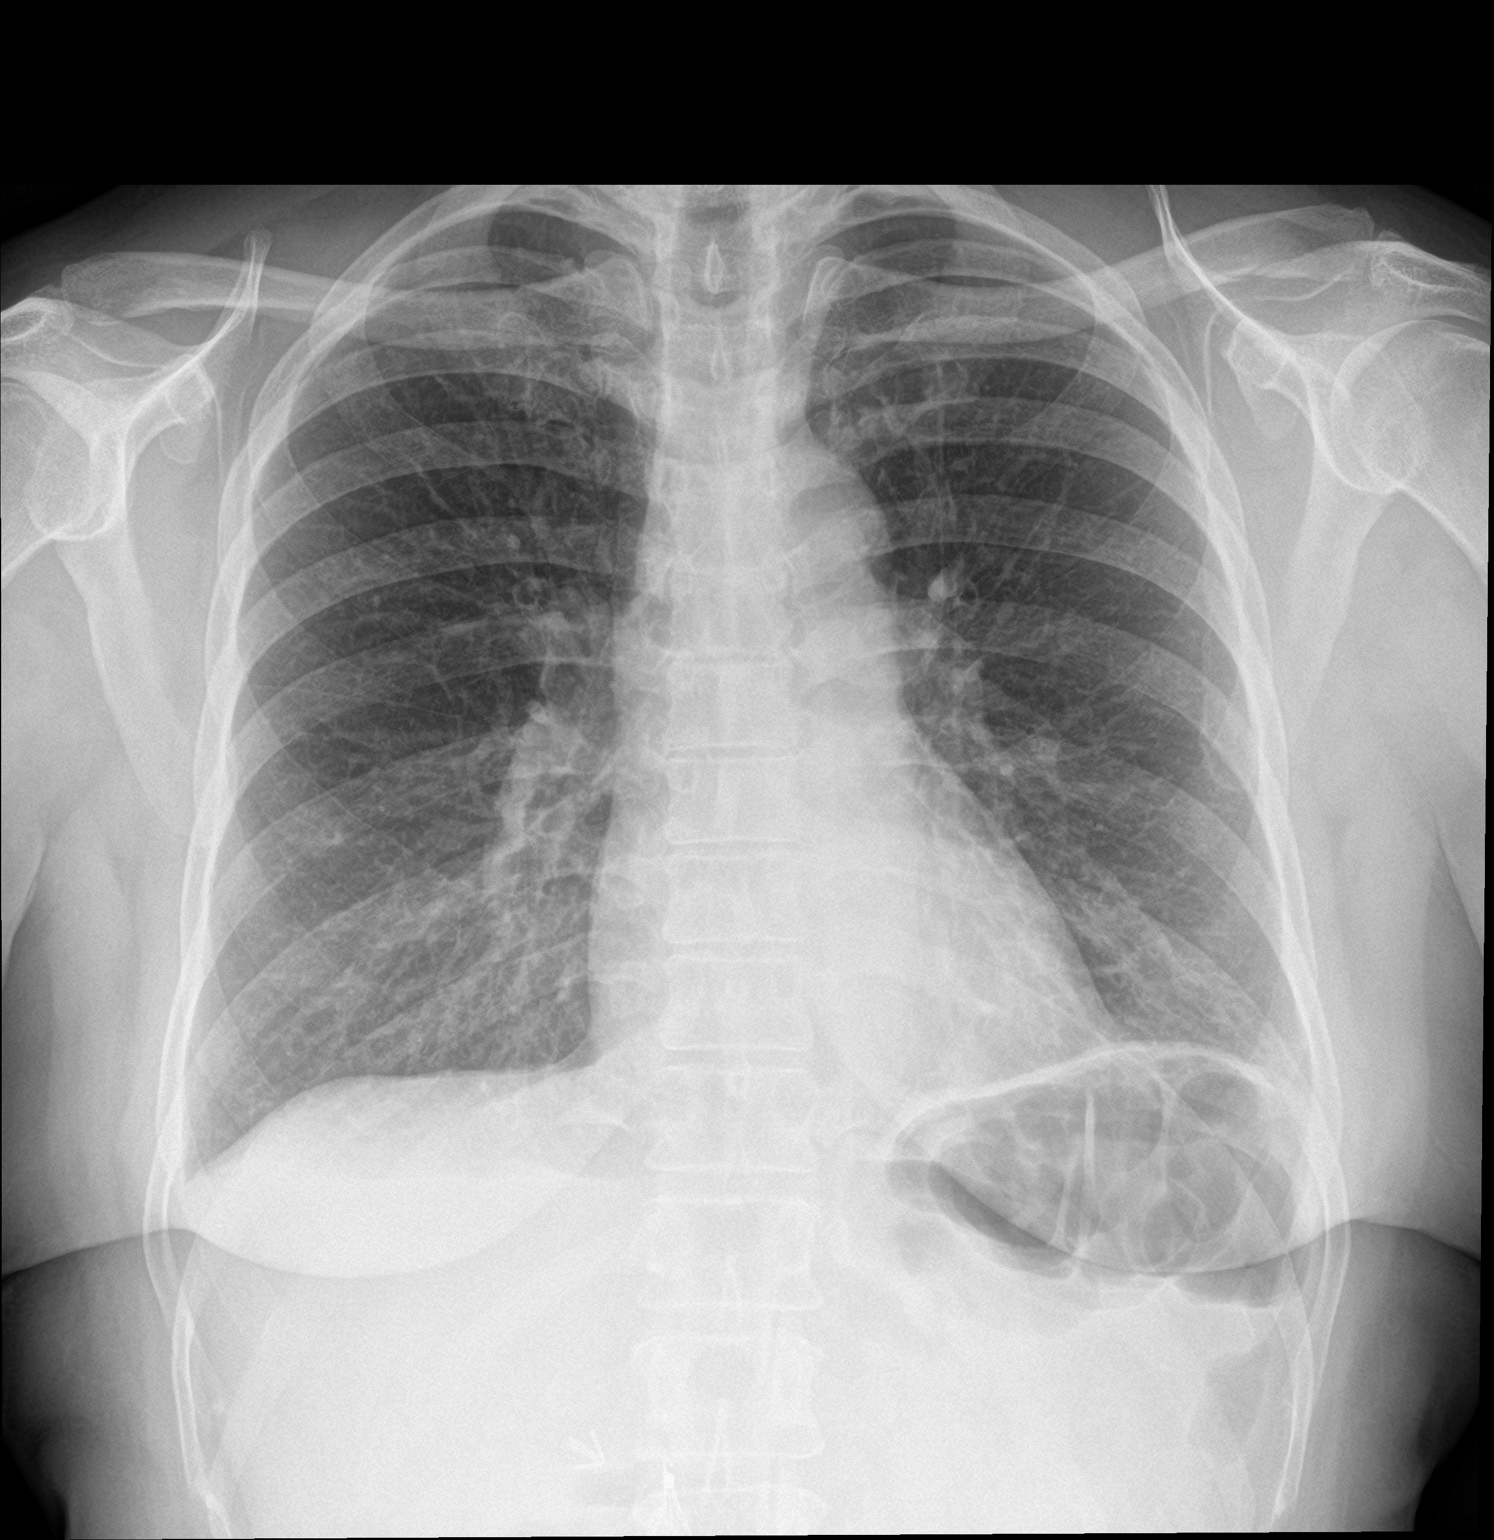

[chest lat]
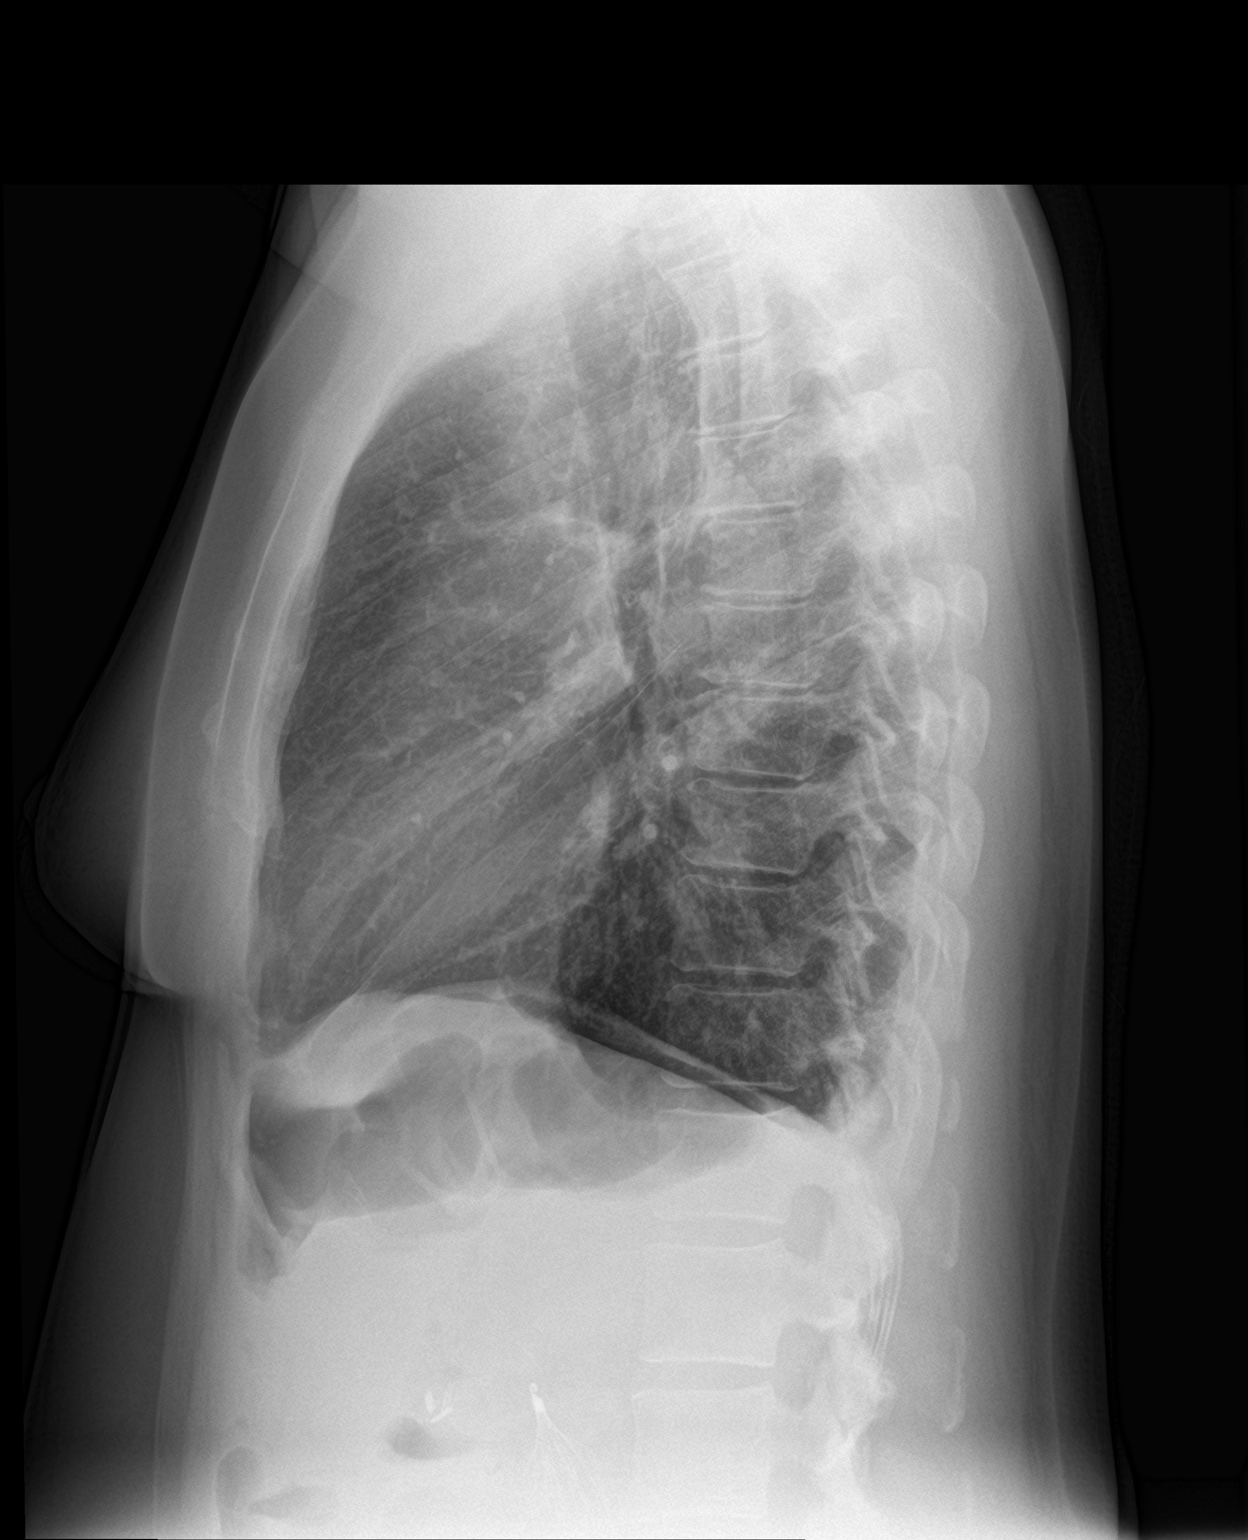

[2 of 2 positions shown; findings below may reference images not displayed]

FINDINGS: Grossly unchanged cardiac silhouette and mediastinal contours.
Interval development of bilateral infrahilar interstitial opacities
with development of trace bilateral effusions. No evidence of edema.
No pneumothorax. Unchanged bones. Post lower cervical ACDF,
incompletely evaluated. Post cholecystectomy. Post IVC filter
placement.
IMPRESSION: Findings worrisome for airways disease / bronchitis, though note,
atypical infection could have a similar appearance, especially in
the setting of trace pleural effusions. A follow-up chest radiograph
in 3 to 4 weeks after treatment is recommended to ensure resolution.

## 2018-07-17 ENCOUNTER — Encounter: Payer: Self-pay | Admitting: Emergency Medicine

## 2018-07-17 ENCOUNTER — Emergency Department: Payer: Medicare HMO

## 2018-07-17 ENCOUNTER — Other Ambulatory Visit: Payer: Self-pay

## 2018-07-17 ENCOUNTER — Emergency Department
Admission: EM | Admit: 2018-07-17 | Discharge: 2018-07-17 | Disposition: A | Discharge status: Home / Self Care | Payer: Medicare HMO | Attending: Emergency Medicine | Admitting: Emergency Medicine

## 2018-07-17 DIAGNOSIS — Z87891 Personal history of nicotine dependence: Secondary | ICD-10-CM | POA: Insufficient documentation

## 2018-07-17 DIAGNOSIS — Z79899 Other long term (current) drug therapy: Secondary | ICD-10-CM | POA: Diagnosis not present

## 2018-07-17 DIAGNOSIS — I10 Essential (primary) hypertension: Secondary | ICD-10-CM | POA: Diagnosis not present

## 2018-07-17 DIAGNOSIS — M25561 Pain in right knee: Secondary | ICD-10-CM | POA: Diagnosis present

## 2018-07-17 DIAGNOSIS — J449 Chronic obstructive pulmonary disease, unspecified: Secondary | ICD-10-CM | POA: Diagnosis not present

## 2018-07-17 DIAGNOSIS — Z7901 Long term (current) use of anticoagulants: Secondary | ICD-10-CM | POA: Diagnosis not present

## 2018-07-17 DIAGNOSIS — Z7984 Long term (current) use of oral hypoglycemic drugs: Secondary | ICD-10-CM | POA: Diagnosis not present

## 2018-07-17 DIAGNOSIS — M5431 Sciatica, right side: Secondary | ICD-10-CM | POA: Insufficient documentation

## 2018-07-17 DIAGNOSIS — I2510 Atherosclerotic heart disease of native coronary artery without angina pectoris: Secondary | ICD-10-CM | POA: Insufficient documentation

## 2018-07-17 DIAGNOSIS — M79661 Pain in right lower leg: Secondary | ICD-10-CM | POA: Diagnosis not present

## 2018-07-17 DIAGNOSIS — R079 Chest pain, unspecified: Secondary | ICD-10-CM | POA: Insufficient documentation

## 2018-07-17 DIAGNOSIS — M5441 Lumbago with sciatica, right side: Secondary | ICD-10-CM | POA: Diagnosis not present

## 2018-07-17 DIAGNOSIS — R072 Precordial pain: Secondary | ICD-10-CM | POA: Diagnosis not present

## 2018-07-17 DIAGNOSIS — E119 Type 2 diabetes mellitus without complications: Secondary | ICD-10-CM | POA: Insufficient documentation

## 2018-07-17 LAB — COMPREHENSIVE METABOLIC PANEL
ALT: 17 U/L (ref 0–44)
AST: 23 U/L (ref 15–41)
Albumin: 3.6 g/dL (ref 3.5–5.0)
Alkaline Phosphatase: 100 U/L (ref 38–126)
Anion gap: 9 (ref 5–15)
BUN: 11 mg/dL (ref 8–23)
CO2: 27 mmol/L (ref 22–32)
Calcium: 9.3 mg/dL (ref 8.9–10.3)
Chloride: 106 mmol/L (ref 98–111)
Creatinine, Ser: 0.85 mg/dL (ref 0.44–1.00)
GFR calc Af Amer: >60 mL/min (ref >60)
GFR calc non Af Amer: >60 mL/min (ref >60)
Glucose, Bld: 97 mg/dL (ref 70–99)
Potassium: 4.4 mmol/L (ref 3.5–5.1)
Sodium: 142 mmol/L (ref 135–145)
Total Bilirubin: 0.6 mg/dL (ref 0.3–1.2)
Total Protein: 7.2 g/dL (ref 6.5–8.1)

## 2018-07-17 LAB — CBC WITH DIFFERENTIAL/PLATELET
Abs Immature Granulocytes: 0.01 10*3/uL (ref 0.00–0.07)
Basophils Absolute: 0 10*3/uL (ref 0.0–0.1)
Basophils Relative: 1 %
Eosinophils Absolute: 0.3 10*3/uL (ref 0.0–0.5)
Eosinophils Relative: 5 %
HCT: 33.5 % — ABNORMAL LOW (ref 36.0–46.0)
Hemoglobin: 11.2 g/dL — ABNORMAL LOW (ref 12.0–15.0)
Immature Granulocytes: 0 %
Lymphocytes Relative: 43 %
Lymphs Abs: 2.7 10*3/uL (ref 0.7–4.0)
MCH: 30.5 pg (ref 26.0–34.0)
MCHC: 33.4 g/dL (ref 30.0–36.0)
MCV: 91.3 fL (ref 80.0–100.0)
Monocytes Absolute: 0.5 10*3/uL (ref 0.1–1.0)
Monocytes Relative: 8 %
Neutro Abs: 2.7 10*3/uL (ref 1.7–7.7)
Neutrophils Relative %: 43 %
Platelets: 397 10*3/uL (ref 150–400)
RBC: 3.67 MIL/uL — ABNORMAL LOW (ref 3.87–5.11)
RDW: 13.8 % (ref 11.5–15.5)
WBC: 6.3 10*3/uL (ref 4.0–10.5)
nRBC: 0 % (ref 0.0–0.2)

## 2018-07-17 LAB — TROPONIN I: Troponin I: <0.03 ng/mL (ref <0.03)

## 2018-07-17 MED ORDER — IOHEXOL 350 MG/ML SOLN
75.0000 mL | Freq: Once | INTRAVENOUS | Status: AC | PRN
  Administered 2018-07-17: 75 mL via INTRAVENOUS
  Filled 2018-07-17: qty 75

## 2018-07-17 MED ORDER — PREDNISONE 10 MG (21) PO TBPK: ORAL_TABLET | ORAL | 0 refills | Status: DC

## 2018-07-17 NOTE — ED Triage Notes (Signed)
Pain back of R leg began today, shooting pain.

## 2018-07-17 NOTE — ED Provider Notes (Signed)
ekg interpreted by me Normal sinus rhythm, rate of 75 Nl axis and intervals.  Nl QRS. Nl ST segments.  Non-acute appearing TWI in II, V3, V4. Unchanged compared to previous ekg January 2019. No acute ischemic changes.   Carrie Mew, MD 07/17/18 [phone]

## 2018-07-17 NOTE — ED Provider Notes (Signed)
[facility] Emergency Department Provider Note  ____________________________________________  Time seen: Approximately 4:07 PM  I have reviewed the triage vital signs and the nursing notes.   HISTORY  Chief Complaint Leg Pain    HPI Valerie Vazquez is a 62 y.o. female with a history of DVT and PE, presents to the emergency department with right posterior knee pain that radiates into right foot along with substernal, intermittent midline chest pain that occurs without increased physical activity.  Patient reports that she has felt similar symptoms in the past when diagnosed with DVT and PE.  Patient reports that she is currently being anticoagulated with Eliquis.  She denies cough, breathlessness or fever.  Patient denies new falls or mechanisms of trauma that would explain right posterior knee pain.  She denies recent travel or prolonged immobilization.  She denies recent surgery. No alleviating measures have been attempted.   Past Medical History:  Diagnosis Date  . Acute right-sided low back pain with right-sided sciatica 10/23/2017  . Anemia   . Bilateral renal cysts   . CAD (coronary artery disease) 09/28/2016  . Chest pain 10/14/2015  . Chronic back pain    "upper and lower" (09/19/2014)  . Chronic shoulder pain (Location of Primary Source of Pain) (Left) 01/29/2016  . Colitis 04/08/2016  . Coronary vasospasm ([place]) 12/09/2014  . Depression    "major" (09/19/2014)  . DVT (deep venous thrombosis) ([place])   . Esophageal spasm   . Essential hypertension 10/28/2013  . Gallstones   . Gastritis   . Gastroenteritis   . GERD (gastroesophageal reflux disease)   . Headache    "couple /month lately" (09/19/2014)  . Heart disease 12/09/2016  . High blood pressure   . High cholesterol   . History of blood transfusion 1985   related to hysterectomy  . History of nuclear stress test    Myoview 1/19:  EF 65, no ischemia or scar  . History of pulmonary embolus (PE)  09/19/2014  . 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 Rule out Paget's disease of the bone with blood work.   . Hyperlipidemia 10/28/2013  . Hypertension   . IBS (irritable bowel syndrome)   . Lumbar central spinal stenosis (moderate at L4-5; mild at L2-3 and L3-4) 01/29/2016  . Migraine    "a few times/yr" (09/19/2014)  . Myocardial infarction ([place]) 10/2010 X 3   "while hospitalized"  . Neck pain 01/29/2016  . Osteoarthritis    "qwhere; mostly around my joints" (09/19/2014)  . Pleural effusion, bilateral 07/31/2015  . Pneumonia 04/2014  . PTSD (post-traumatic stress disorder)   . Pulmonary embolism ([place]) 04/2001; 09/19/2014   "after gallbladder OR; "  . Schizoaffective disorder ([place])   . Schizoaffective disorder, depressive type ([place]) 08/28/2016  . Sleep apnea    "mild" (09/19/2014)  . Snoring    a. sleep study 5/16: No OSA  . SOB (shortness of breath) 07/31/2015  . Spinal stenosis   . Spondylosis   . Suicide attempt ([place]) 08/27/2016  . Type II diabetes mellitus ([place])    "dx'd in 2007; lost weight; no RX for ~ 4 yr now" (09/19/2014)  . Wheezing 02/25/2017    Patient Active Problem List   Diagnosis Date Noted  . Orthostatic hypotension 03/21/2018  . Dental infection 03/21/2018  . Boil 10/27/2017  . Urinary incontinence 10/27/2017  . Elevated alkaline phosphatase level 10/27/2017  . Acute right-sided low back pain with right-sided sciatica 10/23/2017  . Subclinical hypothyroidism 08/26/2017  .  Soft tissue swelling 05/29/2017  . Diabetes mellitus without complication ([place]) 69/67/8938  . Acute right eye pain 03/25/2017  . Elevated glucose 02/25/2017  . Bruise 02/25/2017  . Wheezing 02/25/2017  . Paresthesia 02/06/2017  . Falls 01/30/2017  . Colon polyp 01/30/2017  . RUQ pain 01/12/2017  . Heart disease 12/09/2016  . Plantar fasciitis of left foot 11/07/2016  . Costochondritis 10/06/2016  . BRBPR (bright red blood per rectum) 10/06/2016  . CAD (coronary  artery disease) 09/28/2016  . Diarrhea 09/16/2016  . Epigastric discomfort 09/16/2016  . Schizoaffective disorder, depressive type ([place]) 08/28/2016  . Suicide attempt ([place]) 08/27/2016  . Colitis 04/08/2016  . Absolute anemia 01/29/2016  . Neck pain 01/29/2016  . Chronic shoulder pain (Location of Primary Source of Pain) (Left) 01/29/2016  . Osteoarthritis of hip (Bilateral) 01/29/2016  . Lumbar central spinal stenosis (moderate at L4-5; mild at L2-3 and L3-4) 01/29/2016  . Hx of cervical spine surgery 01/29/2016  . Numbness 11/19/2015  . Breast pain 10/19/2015  . Chest pain 10/14/2015  . Nausea and vomiting 08/15/2015  . SOB (shortness of breath) 07/31/2015  . Pleural effusion, bilateral 07/31/2015  . Esophageal spasm 06/05/2015  . History of DVT (deep vein thrombosis) 02/25/2015  . 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 Constipation 12/12/2014  . Coronary vasospasm ([place]) 12/09/2014  . History of pulmonary embolus (PE) 09/19/2014  . Anxiety and depression 04/13/2014  . IBS (irritable bowel syndrome) 04/11/2014  . GERD (gastroesophageal reflux disease) 04/11/2014  . Insomnia 04/11/2014  . Essential hypertension 10/28/2013  . Anemia 10/28/2013  . Hyperlipidemia 10/28/2013    Past Surgical History:  Procedure Laterality Date  . ABDOMINAL HYSTERECTOMY  1985  . ANTERIOR CERVICAL DECOMP/DISCECTOMY FUSION  10/2012   in [place] C5-C7   . APPENDECTOMY  1985  . BACK SURGERY    . BREAST CYST EXCISION Right   . BREAST EXCISIONAL BIOPSY Right YRS AGO   NEG  . CARDIAC CATHETERIZATION   2000's; 2009  . CHOLECYSTECTOMY  2002  . COLONOSCOPY WITH PROPOFOL N/A 12/12/2016   Procedure: COLONOSCOPY WITH PROPOFOL;  Surgeon: [name], MD;  Location: [facility] ENDOSCOPY;  Service: Endoscopy;  Laterality: N/A;  . COLONOSCOPY WITH PROPOFOL N/A 02/17/2017   Procedure: COLONOSCOPY WITH PROPOFOL;  Surgeon: [name], MD;  Location: [facility] ENDOSCOPY;  Service: Gastroenterology;  Laterality: N/A;  .  ESOPHAGOGASTRODUODENOSCOPY (EGD) WITH PROPOFOL N/A 02/17/2017   Procedure: ESOPHAGOGASTRODUODENOSCOPY (EGD) WITH PROPOFOL;  Surgeon: [name], MD;  Location: [facility] ENDOSCOPY;  Service: Gastroenterology;  Laterality: N/A;  . EXCISION/RELEASE BURSA HIP Right 12/1984  . HERNIA REPAIR  1985  . LEFT HEART CATHETERIZATION WITH CORONARY ANGIOGRAM N/A 12/09/2014   Procedure: LEFT HEART CATHETERIZATION WITH CORONARY ANGIOGRAM;  Surgeon: Burnell Blanks, MD;  Location: [facility] CATH LAB;  Service: Cardiovascular;  Laterality: N/A;  . TONSILLECTOMY AND ADENOIDECTOMY  ~ 1968  . TOTAL HIP ARTHROPLASTY Left 04-06-2014  . UMBILICAL HERNIA REPAIR  1985  . VENA CAVA FILTER PLACEMENT  03/2014    Prior to Admission medications   Medication Sig Start Date End Date Taking? Authorizing Provider  carvedilol (COREG) 3.125 MG tablet Take 1 tablet (3.125 mg total) by mouth 2 (two) times daily with a meal. 03/08/18   Burnell Blanks, MD  clindamycin (CLEOCIN) 150 MG capsule Take 3 capsules (450 mg total) by mouth 3 (three) times daily. 03/19/18   [name], MD  cyclobenzaprine (FLEXERIL) 10 MG tablet TAKE 1 TABLET BY MOUTH THREE TIMES A DAY AS NEEDED FOR MUSCLE SPASMS 05/14/18  Jodelle Green, FNP  diclofenac sodium (VOLTAREN) 1 % GEL Apply 2 g topically 4 (four) times daily. Right arm/shoulder arthritis 04/08/18   McLean-Scocuzza, Nino Glow, MD  dicyclomine (BENTYL) 20 MG tablet TAKE 1 TABLET BY MOUTH 3 TIMES A DAY AS NEEDED FOR SPASMS 08/28/17   [name], MD  diphenhydrAMINE (BENADRYL) 25 MG tablet Take 1 tablet (25 mg total) by mouth every 6 (six) hours. 09/12/16   Jola Schmidt, MD  ELIQUIS 5 MG TABS tablet TAKE 1 TABLET (5 MG TOTAL) BY MOUTH 2 (TWO) TIMES DAILY. 04/12/18   [name], MD  escitalopram (LEXAPRO) 20 MG tablet Take 1 tablet (20 mg total) by mouth daily. 07/01/18   Ursula Alert, MD  gabapentin (NEURONTIN) 600 MG tablet Take 1-1.5 tablets (600-900 mg total) by mouth 2 (two)  times daily. Take 1 tablet in the AM and 1.5 tablet at bedtime 07/01/18   Ursula Alert, MD  isosorbide mononitrate (IMDUR) 30 MG 24 hr tablet TAKE 1.5 TABLETS (45 MG TOTAL) BY MOUTH DAILY. 03/08/18   Burnell Blanks, MD  levothyroxine (SYNTHROID, LEVOTHROID) 25 MCG tablet TAKE 1 TABLET BY MOUTH BEFORE BREAKFAST 2-3 HOURS BEFORE TAKING ANY OTHER MEDICATION OR SUPPLEMENT 06/30/18   [name], MD  methylPREDNISolone (MEDROL DOSEPAK) 4 MG TBPK tablet Take according to pack instructions 06/15/18   Jodelle Green, FNP  Multiple Vitamins-Minerals (SUPER THERA VITE M) TABS Take by mouth.    [provider]  neomycin-polymyxin b-dexamethasone (MAXITROL) 3.5-10000-0.1 OINT APPLY A SMALL AMOUNT IN RIGHT EYE 2 TIMES A DAY FOR 14 DAYS 03/25/17   [provider]  nitroGLYCERIN (NITROSTAT) 0.4 MG SL tablet Place 1 tablet (0.4 mg total) under the tongue every 5 (five) minutes as needed for chest pain. 03/08/18 06/06/18  Burnell Blanks, MD  OLANZapine (ZYPREXA) 10 MG tablet Take 1 tablet (10 mg total) by mouth at bedtime. 07/01/18   Ursula Alert, MD  ondansetron (ZOFRAN) 4 MG tablet Take 1 tablet (4 mg total) by mouth 2 (two) times daily as needed for nausea or vomiting. 05/28/18   [name], MD  pantoprazole (PROTONIX) 40 MG tablet TAKE 1 TABLET BY MOUTH EVERY DAY 06/08/18   [name], MD  pravastatin (PRAVACHOL) 10 MG tablet Take 1 tablet (10 mg total) by mouth at bedtime. 03/26/18   [name], MD  predniSONE (STERAPRED UNI-PAK 21 TAB) 10 MG (21) TBPK tablet Take 6 tabs the the 1st day. Take 6 tabs the the 2nd day. Take 5 tabs the the 3rd day. Take 5 tabs the 4th day. Take 4 tabs the the 5th day.Take 4 tabs the the 6th day.Take 3 tabs the 7th day.Take 3 tabs the 8th day. Take 2 tabs the 9th day. Take 2 tabs the 10th day. Take 1 tab the 11th day. Take 1 tab the 12th day. 07/17/18   Vallarie Mare M, PA-C  PROCARDIA 10 MG capsule TAKE 1 CAPSULE BY MOUTH 2  TIMES DAILY. TAKE 30 MINS BEFORE MEALS UP TO 1X PER DAY 03/03/18   [name], MD  ramelteon (ROZEREM) 8 MG tablet Take 1 tablet (8 mg total) by mouth at bedtime. 07/01/18   Ursula Alert, MD  sitaGLIPtin (JANUVIA) 50 MG tablet Take 1 tablet (50 mg total) by mouth daily. 01/06/18   [name], MD  traMADol (ULTRAM) 50 MG tablet Take 1 tablet (50 mg total) by mouth every 8 (eight) hours as needed for moderate pain. 06/15/18  Jodelle Green, FNP    Allergies Abilify [aripiprazole]; Augmentin [amoxicillin-pot clavulanate]; Bactrim [sulfamethoxazole-trimethoprim]; Ceftin [cefuroxime axetil]; Coconut oil; Diclofenac; Dye fdc red [red dye]; Indocin [indomethacin]; Linaclotide; Lisinopril; Morphine; Morphine and related; Simvastatin; Sulfa antibiotics; Sulfamethoxazole-trimethoprim; Wellbutrin [bupropion]; Zetia [ezetimibe]; Oxycodone-acetaminophen; Quetiapine fumarate; and Quetiapine fumarate  Family History  Problem Relation Age of Onset  . Stroke Mother        Deceased  . Lung cancer Father        Deceased  . Healthy Daughter   . Healthy Son        x 2  . Other Son        Suicide  . Cancer Brother   . Heart attack Neg Hx     Social History Social History   Tobacco Use  . Smoking status: Former Smoker    Packs/day: 1.50    Years: 10.00    Pack years: 15.00    Types: Cigarettes    Last attempt to quit: 04/12/1979    Years since quitting: 39.2  . Smokeless tobacco: Never Used  Substance Use Topics  . Alcohol use: No    Alcohol/week: 0.0 standard drinks  . Drug use: No     Review of Systems  Constitutional: No fever/chills Eyes: No visual changes. No discharge ENT: No upper respiratory complaints. Cardiovascular: Patient has midline, substernal chest pain. Respiratory: no cough. No SOB. Gastrointestinal: No abdominal pain.  No nausea, no vomiting.  No diarrhea.  No constipation. Genitourinary: Negative for dysuria. No hematuria Musculoskeletal: Patient has  right posterior knee pain.  Skin: Negative for rash, abrasions, lacerations, ecchymosis. Neurological: Negative for headaches, focal weakness or numbness.   ____________________________________________   PHYSICAL EXAM:  VITAL SIGNS: ED Triage Vitals  Enc Vitals Group     BP 07/17/18 1525 140/77     Pulse Rate 07/17/18 1525 84     Resp 07/17/18 1525 18     Temp 07/17/18 1525 98.5 F (36.9 C)     Temp Source 07/17/18 1525 Oral     SpO2 07/17/18 1525 95 %     Weight 07/17/18 1526 219 lb (99.3 kg)     Height 07/17/18 1526 5\' 6"  (1.676 m)     Head Circumference --      Peak Flow --      Pain Score 07/17/18 1526 7     Pain Loc --      Pain Edu? --      Excl. in [place]? --      Constitutional: Alert and oriented. Well appearing and in no acute distress. Eyes: Conjunctivae are normal. PERRL. EOMI. Head: Atraumatic. Cardiovascular: Normal rate, regular rhythm. Normal S1 and S2.  Good peripheral circulation. Respiratory: Normal respiratory effort without tachypnea or retractions. Lungs CTAB. Good air entry to the bases with no decreased or absent breath sounds. Gastrointestinal: Bowel sounds 4 quadrants. Soft and nontender to palpation. No guarding or rigidity. No palpable masses. No distention. No CVA tenderness. Musculoskeletal: Full range of motion to all extremities. No gross deformities appreciated.  No tenderness with palpation of the calf, right. Palpable dorsalis pedis pulse, right. Positive straight leg raise test, right.  Neurologic:  Normal speech and language. No gross focal neurologic deficits are appreciated.  Skin: No erythema or edema of the skin overlying the right posterior knee. Psychiatric: Mood and affect are normal. Speech and behavior are normal. Patient exhibits appropriate insight and judgement.   ____________________________________________   LABS (all labs ordered are listed, but only abnormal results are  displayed)  Labs Reviewed  CBC WITH  DIFFERENTIAL/PLATELET - Abnormal; Notable for the following components:      Result Value   RBC 3.67 (*)    Hemoglobin 11.2 (*)    HCT 33.5 (*)    All other components within normal limits  COMPREHENSIVE METABOLIC PANEL  TROPONIN I   ____________________________________________  EKG   ____________________________________________  RADIOLOGY I personally viewed and evaluated these images as part of my medical decision making, as well as reviewing the written report by the radiologist.    Dg Chest 2 View  Result Date: 07/17/2018 CLINICAL DATA:  Intermittent chest pain. Ex-smoker. EXAM: CHEST - 2 VIEW COMPARISON:  08/21/2017. FINDINGS: Normal sized heart. Mildly tortuous aorta. No significant change in minimal bilateral pleural thickening or fluid. Minimal atelectasis or scarring at the posterior lung bases on the lateral view. The overall lung volumes remain mildly hyperexpanded. Cervical spine fixation hardware, cholecystectomy clips and inferior vena cava filter. IMPRESSION: 1. No acute abnormality. 2. Stable mild changes of COPD. 3. Stable minimal bilateral pleural thickening or fluid. Electronically Signed   By: Claudie Revering M.D.   On: 07/17/2018 16:34   Ct Angio Chest Pe W And/or Wo Contrast  Result Date: 07/17/2018 CLINICAL DATA:  Pain back of R leg began today, shooting pain. Clinical concern for pulmonary embolus. EXAM: CT ANGIOGRAPHY CHEST WITH CONTRAST TECHNIQUE: Multidetector CT imaging of the chest was performed using the standard protocol during bolus administration of intravenous contrast. Multiplanar CT image reconstructions and MIPs were obtained to evaluate the vascular anatomy. CONTRAST:  71mL OMNIPAQUE IOHEXOL 350 MG/ML SOLN COMPARISON:  02/23/2017. FINDINGS: Cardiovascular: Heart size upper normal. No substantial pericardial effusion. Coronary artery calcification is evident. No filling defect in the opacified pulmonary arteries to suggest the presence of an acute  pulmonary embolus. Mediastinum/Nodes: No mediastinal lymphadenopathy. There is no hilar lymphadenopathy. The esophagus has normal imaging features. There is no axillary lymphadenopathy. Lungs/Pleura: The central tracheobronchial airways are patent. Bilateral lower lobe atelectasis is associated with small bilateral pleural effusions. No focal airspace consolidation. No pulmonary edema. Upper Abdomen: Unremarkable. Musculoskeletal: No worrisome lytic or sclerotic osseous abnormality. Review of the MIP images confirms the above findings. IMPRESSION: 1. No CT evidence for acute pulmonary embolus. 2. Small bilateral pleural effusions with dependent atelectasis. Electronically Signed   By: Misty Stanley M.D.   On: 07/17/2018 18:01   US Venous Img Lower Unilateral Right  Result Date: 07/17/2018 CLINICAL DATA:  Chest pain EXAM: RIGHT LOWER EXTREMITY VENOUS DOPPLER ULTRASOUND TECHNIQUE: Gray-scale sonography with graded compression, as well as color Doppler and duplex ultrasound were performed to evaluate the lower extremity deep venous systems from the level of the common femoral vein and including the common femoral, femoral, profunda femoral, popliteal and calf veins including the posterior tibial, peroneal and gastrocnemius veins when visible. The superficial great saphenous vein was also interrogated. Spectral Doppler was utilized to evaluate flow at rest and with distal augmentation maneuvers in the common femoral, femoral and popliteal veins. COMPARISON:  None. FINDINGS: Contralateral Common Femoral Vein: Respiratory phasicity is normal and symmetric with the symptomatic side. No evidence of thrombus. Normal compressibility. Common Femoral Vein: No evidence of thrombus. Normal compressibility, respiratory phasicity and response to augmentation. Saphenofemoral Junction: No evidence of thrombus. Normal compressibility and flow on color Doppler imaging. Profunda Femoral Vein: No evidence of thrombus. Normal  compressibility and flow on color Doppler imaging. Femoral Vein: No evidence of thrombus. Normal compressibility, respiratory phasicity and response to augmentation. Popliteal Vein: No  evidence of thrombus. Normal compressibility, respiratory phasicity and response to augmentation. Calf Veins: No evidence of thrombus. Normal compressibility and flow on color Doppler imaging. Superficial Great Saphenous Vein: No evidence of thrombus. Normal compressibility. Venous Reflux:  None. Other Findings:  None. IMPRESSION: No evidence of deep venous thrombosis. Electronically Signed   By: Rolm Baptise M.D.   On: 07/17/2018 17:14    ____________________________________________    PROCEDURES  Procedure(s) performed:    Procedures    Medications  iohexol (OMNIPAQUE) 350 MG/ML injection 75 mL (75 mLs Intravenous Contrast Given 07/17/18 1722)     ____________________________________________   INITIAL IMPRESSION / ASSESSMENT AND PLAN / ED COURSE  Pertinent labs & imaging results that were available during my care of the patient were reviewed by me and considered in my medical decision making (see chart for details).  Review of the [place] CSRS was performed in accordance of the [place] prior to dispensing any controlled drugs.       ----------------------------------------- 4:07 PM on 07/17/2018 -----------------------------------------  Patient presents to the emergency department with right posterior knee pain that she describes as aching and intermittent and patient has also had sharp midline, substernal chest pain that she has had in the past in association with prior diagnoses of PE.  Patient reports that she is currently being anticoagulated with Eliquis.  Awaiting CTA and venous ultrasound results.  Assessment and Plan:  Chest pain:  Posterior right knee pain:  Patient presents to the emergency department with right posterior knee pain that radiates into the right calf and right foot.   Patient also complained of intermittent, sharp substernal chest pain that had occurred in the past when diagnosed with PE.  Differential diagnosis included DVT, PE, community-acquired pneumonia and sciatica.  Venous ultrasound revealed no evidence of DVT.  CTA revealed no evidence of PE.  No opacities or consolidations that would suggest community-acquired pneumonia on chest x-ray.  Positive straight leg raise test on physical exam increases suspicion for sciatica.  Patient is currently being anticoagulated with Eliquis and anti-inflammatories are contraindicated.  Patient was discharged with tapered prednisone.  Patient is already taking Flexeril at home for back pain.  Patient education regarding the importance of stretching and low back strengthening were also given.  All patient questions were answered.    ____________________________________________  FINAL CLINICAL IMPRESSION(S) / ED DIAGNOSES  Final diagnoses:  Sciatica of right side      NEW MEDICATIONS STARTED DURING THIS VISIT:  ED Discharge Orders         Ordered    predniSONE (STERAPRED UNI-PAK 21 TAB) 10 MG (21) TBPK tablet     07/17/18 1843              This chart was dictated using voice recognition software/Dragon. Despite best efforts to proofread, errors can occur which can change the meaning. Any change was purely unintentional.    Lannie Fields, PA-C 07/17/18 Willodean Rosenthal, MD 07/17/18 2045

## 2018-07-17 NOTE — ED Notes (Signed)
ekg and  cxr  Done

## 2018-08-02 ENCOUNTER — Telehealth: Payer: Self-pay

## 2018-08-02 ENCOUNTER — Other Ambulatory Visit: Payer: Self-pay | Admitting: Family Medicine

## 2018-08-02 DIAGNOSIS — L720 Epidermal cyst: Secondary | ICD-10-CM | POA: Diagnosis not present

## 2018-08-02 DIAGNOSIS — E063 Autoimmune thyroiditis: Secondary | ICD-10-CM

## 2018-08-02 NOTE — Telephone Encounter (Signed)
Copied from [place] [id number]. Topic: General - Other >> Aug 02, 2018  2:17 PM Oneta Rack wrote: Relation to pt: self  Call back number: [phone]  Pharmacy: [id number] - [place], [place] [phone] (Phone) [phone] (Fax)    Reason for call:  Patient contacted pharmacy but was unable to reach a pharmacist therefore requesting the following medication refill: cyclobenzaprine (FLEXERIL) 10 MG tablet  and traMADol (ULTRAM) 50 MG tablet. Advised patient please allow 48 to 72 hour turn around time.  Patient also wanted to let PCP know she was seen today by [facility]ology [facility] regarding the removal of a reoccurring cyst located on the lining of her underwear. The procedure was called "punch" removal, patient will follow up with specialist in 2 weeks to remove stitches.

## 2018-08-03 NOTE — Telephone Encounter (Signed)
It appears she was given tramadol for an acute problem by Lauren.  If persistent problem, will need to be reevaluated.  Let me know if this is a problem.  Please also shoe to Dr Caryl Bis

## 2018-08-03 NOTE — Telephone Encounter (Signed)
Last OV 06/15/2018   Cyclobenzaprine last refilled 05/14/2018 disp 30 with no refills   Tramadol last refilled 06/15/2018 disp 15 with no refills   Sent to PCP for approval

## 2018-08-03 NOTE — Telephone Encounter (Signed)
Called patient and left a VM to call back. CRM created and sent to [facility] pool.  

## 2018-08-04 ENCOUNTER — Other Ambulatory Visit: Payer: Self-pay | Admitting: Family Medicine

## 2018-08-04 ENCOUNTER — Telehealth: Payer: Self-pay | Admitting: *Deleted

## 2018-08-04 DIAGNOSIS — Z760 Encounter for issue of repeat prescription: Secondary | ICD-10-CM

## 2018-08-04 NOTE — Telephone Encounter (Signed)
Copied from [place] [id number]. Topic: Quick Communication - See Telephone Encounter >> Aug 03, 2018  2:24 PM Myriam Forehand, [place] wrote: CRM for notification. See Telephone encounter for: 08/03/18. >> Aug 04, 2018 10:43 AM Judyann Munson wrote: Patient is requesting a return call from [place]. Please advise

## 2018-08-05 ENCOUNTER — Telehealth: Payer: Self-pay

## 2018-08-05 NOTE — Telephone Encounter (Signed)
Copied from [place] [id number]. Topic: Quick Communication - See Telephone Encounter >> Aug 03, 2018  2:24 PM Myriam Forehand, [place] wrote: CRM for notification. See Telephone encounter for: 08/03/18. >> Aug 04, 2018 10:43 AM Judyann Munson wrote: Patient is requesting a return call from [place]. Please advise  >> Aug 05, 2018  1:19 PM Sheran Luz wrote: Patient is requesting a call back from [place].

## 2018-08-06 ENCOUNTER — Ambulatory Visit (INDEPENDENT_AMBULATORY_CARE_PROVIDER_SITE_OTHER): Payer: Medicare HMO | Admitting: Family Medicine

## 2018-08-06 ENCOUNTER — Encounter: Payer: Self-pay | Admitting: Family Medicine

## 2018-08-06 VITALS — BP 120/78 | HR 94 | Temp 98.4°F | Ht 66.0 in | Wt 222.6 lb

## 2018-08-06 DIAGNOSIS — M62838 Other muscle spasm: Secondary | ICD-10-CM

## 2018-08-06 DIAGNOSIS — K047 Periapical abscess without sinus: Secondary | ICD-10-CM | POA: Diagnosis not present

## 2018-08-06 DIAGNOSIS — K034 Hypercementosis: Secondary | ICD-10-CM

## 2018-08-06 DIAGNOSIS — M159 Polyosteoarthritis, unspecified: Secondary | ICD-10-CM

## 2018-08-06 DIAGNOSIS — K0889 Other specified disorders of teeth and supporting structures: Secondary | ICD-10-CM | POA: Diagnosis not present

## 2018-08-06 MED ORDER — TRAMADOL HCL 50 MG PO TABS: 50.0000 mg | ORAL_TABLET | Freq: Three times a day (TID) | ORAL | 0 refills | Status: DC | PRN

## 2018-08-06 MED ORDER — PENICILLIN V POTASSIUM 500 MG PO TABS: 500.0000 mg | ORAL_TABLET | Freq: Three times a day (TID) | ORAL | 0 refills | Status: DC

## 2018-08-06 MED ORDER — CYCLOBENZAPRINE HCL 10 MG PO TABS: ORAL_TABLET | ORAL | 0 refills | Status: DC

## 2018-08-06 NOTE — Progress Notes (Signed)
Subjective:    Patient ID: Valerie Vazquez, female    DOB: 1955-12-20, 62 y.o.   MRN: [id number]  HPI  Presents to clinic c/o possible dental infection on right upper jaw.  States she feels pain and swelling in that area.  Patient believes her one tooth is cracked.  States she knows she knew will need to see a dentist or Chief Financial Officer.  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  Patient also c/o continued multiple joint pains in knees, hips, back, shoulders with muscle spasms.   Patient Active Problem List   Diagnosis Date Noted  . Orthostatic hypotension 03/21/2018  . Dental infection 03/21/2018  . Boil 10/27/2017  . Urinary incontinence 10/27/2017  . Elevated alkaline phosphatase level 10/27/2017  . Acute right-sided low back pain with right-sided sciatica 10/23/2017  . Subclinical hypothyroidism 08/26/2017  . Soft tissue swelling 05/29/2017  . Diabetes mellitus without complication ([place]) 24/23/5361  . Acute right eye pain 03/25/2017  . Elevated glucose 02/25/2017  . Bruise 02/25/2017  . Wheezing 02/25/2017  . Paresthesia 02/06/2017  . Falls 01/30/2017  . Colon polyp 01/30/2017  . RUQ pain 01/12/2017  . Heart disease 12/09/2016  . Plantar fasciitis of left foot 11/07/2016  . Costochondritis 10/06/2016  . BRBPR (bright red blood per rectum) 10/06/2016  . CAD (coronary artery disease) 09/28/2016  . Diarrhea 09/16/2016  . Epigastric discomfort 09/16/2016  . Schizoaffective disorder, depressive type ([place]) 08/28/2016  . Suicide attempt ([place]) 08/27/2016  . Colitis 04/08/2016  . Absolute anemia 01/29/2016  . Neck pain 01/29/2016  . Chronic shoulder pain (Location of Primary Source of Pain) (Left) 01/29/2016  . Osteoarthritis of hip (Bilateral) 01/29/2016  . Lumbar central spinal stenosis (moderate at L4-5; mild at L2-3 and L3-4) 01/29/2016  . Hx of cervical spine surgery 01/29/2016  . Numbness 11/19/2015  . Breast pain 10/19/2015    . Chest pain 10/14/2015  . Nausea and vomiting 08/15/2015  . SOB (shortness of breath) 07/31/2015  . Pleural effusion, bilateral 07/31/2015  . Esophageal spasm 06/05/2015  . History of DVT (deep vein thrombosis) 02/25/2015  . 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 Constipation 12/12/2014  . Coronary vasospasm ([place]) 12/09/2014  . History of pulmonary embolus (PE) 09/19/2014  . Anxiety and depression 04/13/2014  . IBS (irritable bowel syndrome) 04/11/2014  . GERD (gastroesophageal reflux disease) 04/11/2014  . Insomnia 04/11/2014  . Essential hypertension 10/28/2013  . Anemia 10/28/2013  . Hyperlipidemia 10/28/2013   Social History   Tobacco Use  . Smoking status: Former Smoker    Packs/day: 1.50    Years: 10.00    Pack years: 15.00    Types: Cigarettes    Last attempt to quit: 04/12/1979    Years since quitting: 39.3  . Smokeless tobacco: Never Used  Substance Use Topics  . Alcohol use: No    Alcohol/week: 0.0 standard drinks   Review of Systems  Constitutional: Negative for chills, fatigue and fever.  HENT: +dental pain, right upper jaw Eyes: Negative.   Respiratory: Negative for cough, shortness of breath and wheezing.   Cardiovascular: Negative for chest pain, palpitations and leg swelling.  Gastrointestinal: Negative for abdominal pain, diarrhea, nausea and vomiting.  Genitourinary: Negative for dysuria, frequency and urgency.  Musculoskeletal: Multiple joint pains.  Skin: Negative for color change, pallor and rash.  Neurological: Negative for syncope, light-headedness and headaches.  Psychiatric/Behavioral: The patient is not nervous/anxious.       Objective:   Physical Exam  Constitutional: She is oriented to person, place, and time. No distress.  HENT:  Head: Normocephalic and atraumatic.    Mouth/Throat: Uvula is midline.    Multiple teeth already have been pulled. Tooth on diagram with red line appears to be cracked and black coloring seen on tooth.  Tenderness on face indicated by red circle on diagram.   Eyes: Conjunctivae and EOM are normal. No scleral icterus.  Neck: No tracheal deviation present.  Cardiovascular: Normal rate and regular rhythm.  Pulmonary/Chest: Effort normal and breath sounds normal. No respiratory distress.  Musculoskeletal:  Chronic knee, hip, back, shoulder pains.   Lymphadenopathy:    She has no cervical adenopathy.  Neurological: She is alert and oriented to person, place, and time.  Skin: Skin is warm and dry. No pallor.  Psychiatric: She has a normal mood and affect. Her behavior is normal.  Nursing note and vitals reviewed.     Vitals:   08/06/18 1115  BP: 120/78  Pulse: 94  Temp: 98.4 F (36.9 C)  SpO2: 94%   Assessment & Plan:   Dental infection, dental pain, hypercementosis - patient will take penicillin VK 3 times daily for 10 days to reduce dental infection.  Oral surgery referral given due to possibility of patient needing this tooth and other teeth pulled and history of hypercementosis.  Muscle spasm/osteoarthritis of multiple joints-patient will use tramadol as needed for more severe joint pain.  Patient will use cyclobenzaprine as needed for any muscle spasm.  Patient aware she can also use acetaminophen for pain control and encouraged to do gentle stretching and range of motion exercises to reduce to keep self from becoming too stiff.  Keep regularly scheduled follow-up with PCP as planned.  Return to clinic sooner if any issues arise or if current symptoms persist or worsen.  Patient aware she will be hearing from our office sometime next week in regards to oral surgery referral.

## 2018-08-08 DIAGNOSIS — K047 Periapical abscess without sinus: Secondary | ICD-10-CM | POA: Diagnosis not present

## 2018-08-08 IMAGING — CT CT ABD-PELV W/ CM
2 of 6 series · 14 of 46 positions shown, 16 images · IV contrast (APPLIED)
Comparison: CT of the abdomen and pelvis from 04/05/2016

CLINICAL DATA: Acute onset of lower intermittent abdominal pain,
headache, fatigue and fever. Initial encounter.

EXAM:
CT ABDOMEN AND PELVIS WITH CONTRAST
TECHNIQUE: Multidetector CT imaging of the abdomen and pelvis was performed
using the standard protocol following bolus administration of
intravenous contrast.
CONTRAST:  100mL ZOU65Z-XGG IOPAMIDOL (ZOU65Z-XGG) INJECTION 61%

[Series 5: coronal st · coronal · 0.91mm/px · 3 of 87 slices shown]
[im 29/87  soft-tissue]
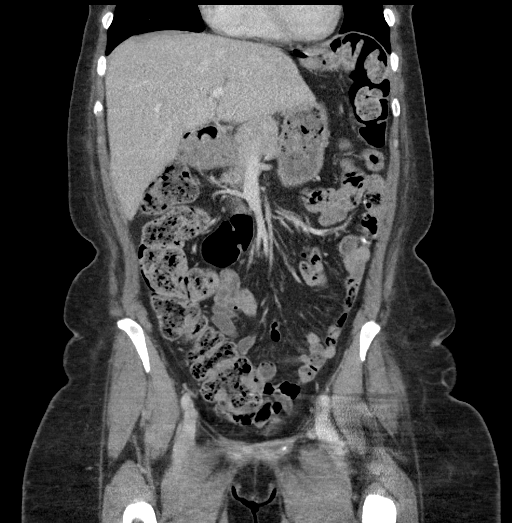
[im 39/87  soft-tissue]
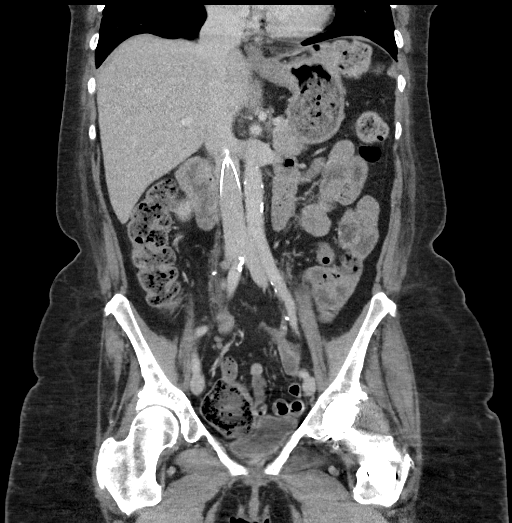
[im 48/87  soft-tissue]
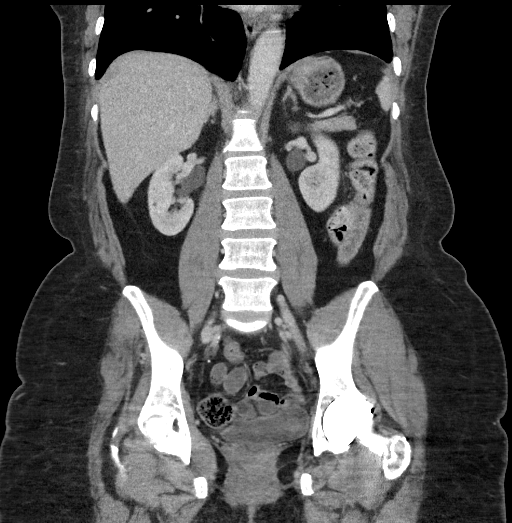

[Series 7: routine abd/pel with (person_name) (person_name) · axial · 0.94mm/px · z∈[-1072,-682]mm · 11 of 94 slices shown, 13 images]
[im 8/94  soft-tissue]
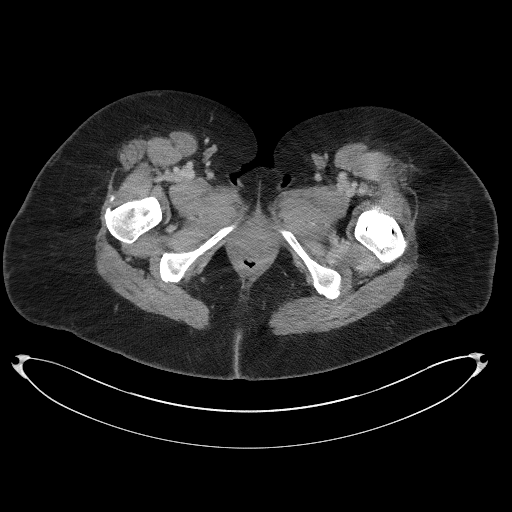
[im 8/94  bone]
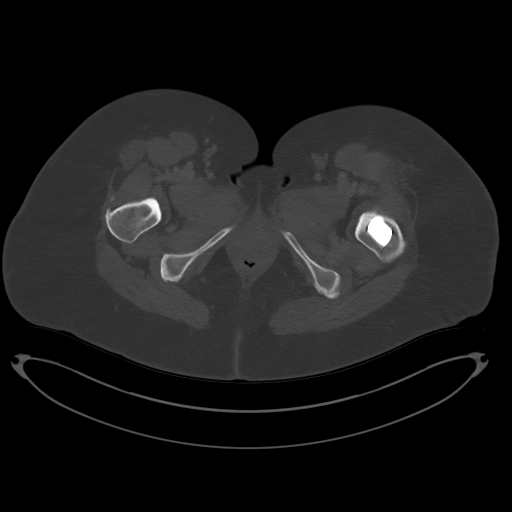
[im 16/94  soft-tissue]
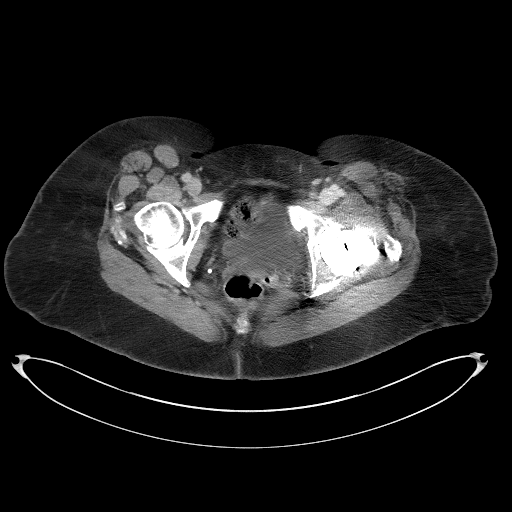
[im 24/94  soft-tissue]
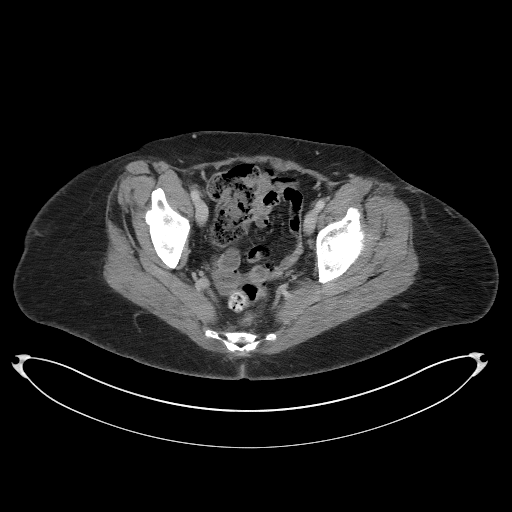
[im 32/94  soft-tissue]
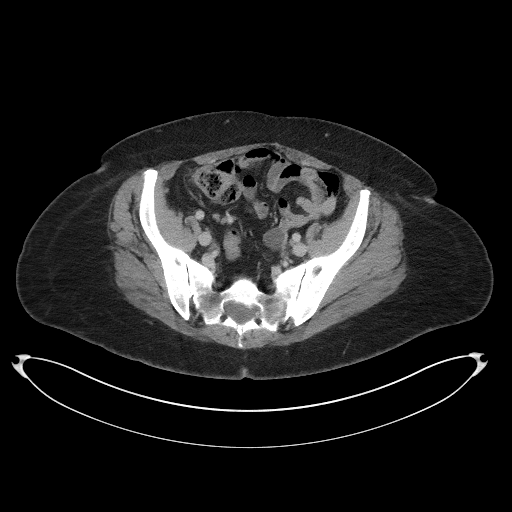
[im 39/94  soft-tissue]
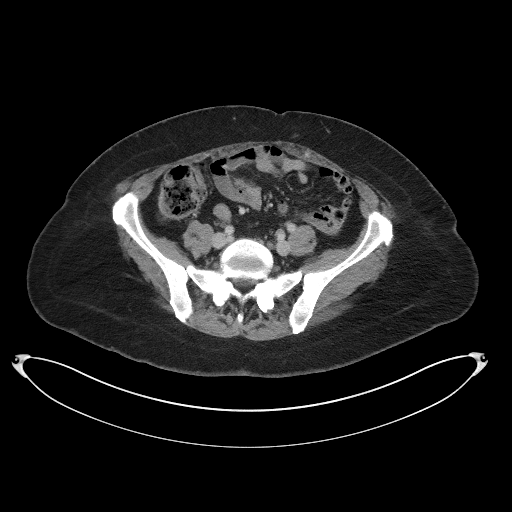
[im 47/94  soft-tissue]
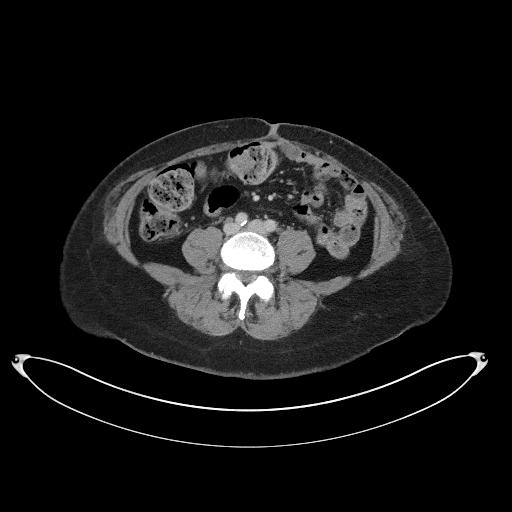
[im 55/94  soft-tissue]
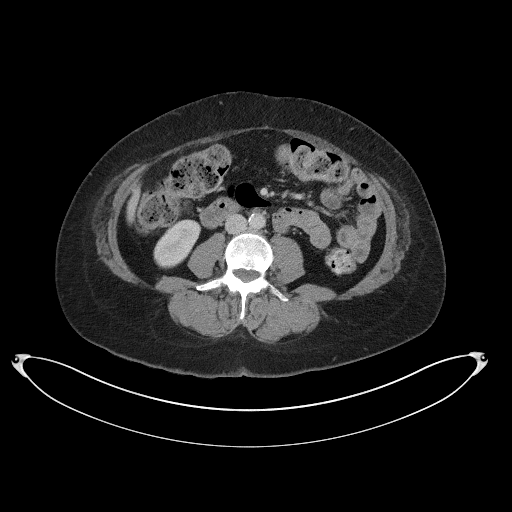
[im 63/94  soft-tissue]
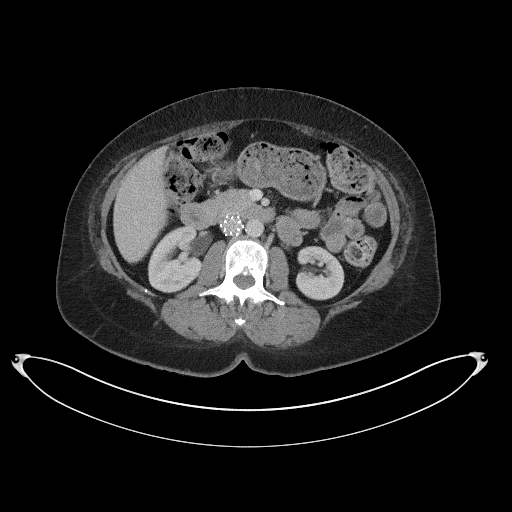
[im 70/94  soft-tissue]
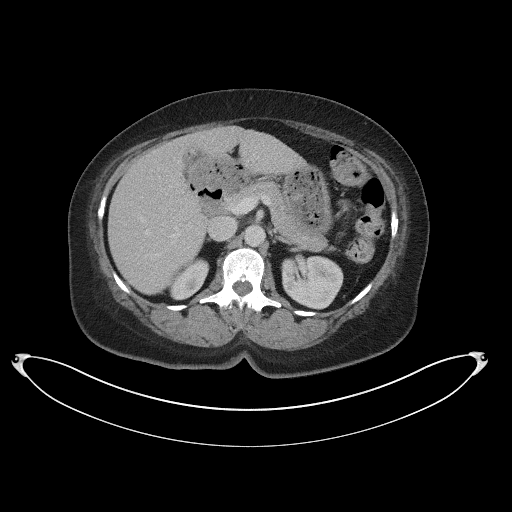
[im 70/94  bone]
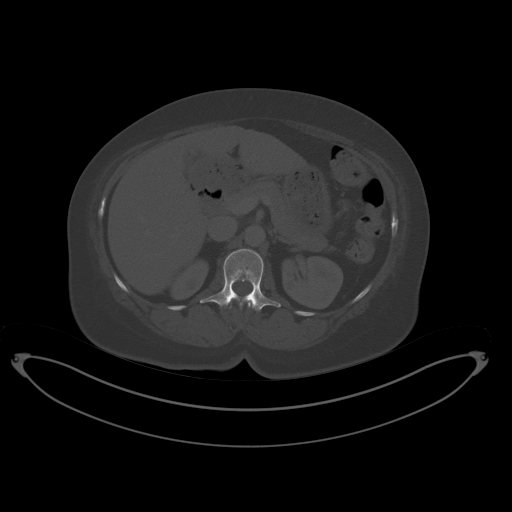
[im 78/94  soft-tissue]
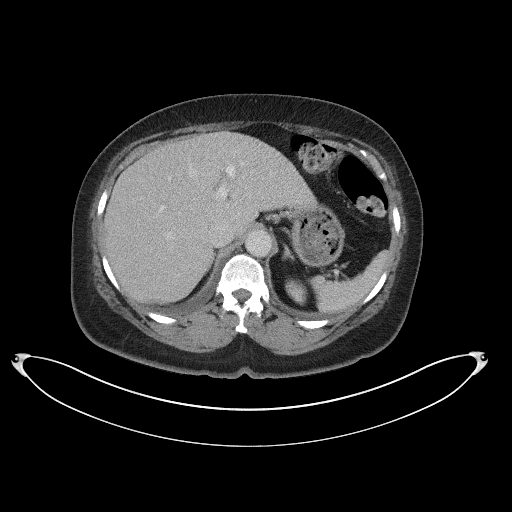
[im 86/94  soft-tissue]
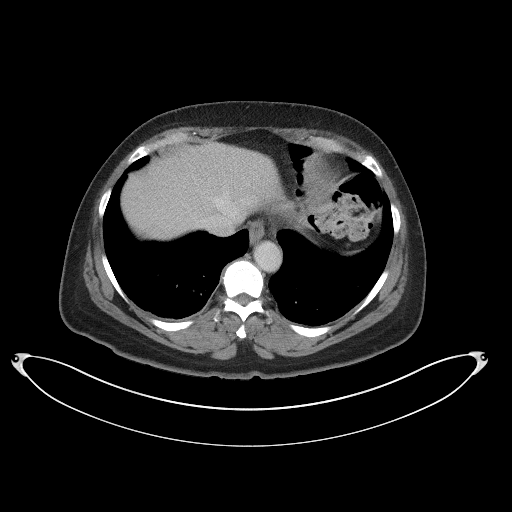

[14 of 46 positions shown; findings below may reference images not displayed]

FINDINGS: Lower chest: Trace right-sided pleural fluid noted. The visualized
portions of the mediastinum are unremarkable.

Hepatobiliary: The liver is unremarkable in appearance. The patient
is status post cholecystectomy, with clips noted at the gallbladder
fossa. The common bile duct remains normal in caliber.

Pancreas: The pancreas is within normal limits.

Spleen: The spleen is unremarkable in appearance.

Adrenals/Urinary Tract: The adrenal glands are unremarkable in
appearance. The kidneys are within normal limits. There is no
evidence of hydronephrosis. No renal or ureteral stones are
identified. No perinephric stranding is seen.

Stomach/Bowel: The stomach is unremarkable in appearance. The small
bowel is within normal limits. The patient is status post
appendectomy. The colon is unremarkable in appearance.

Vascular/Lymphatic: Scattered calcification is seen along the
abdominal aorta and its branches. The abdominal aorta is otherwise
grossly unremarkable. An IVC filter is noted in expected position.
The inferior vena cava is grossly unremarkable. No retroperitoneal
lymphadenopathy is seen. No pelvic sidewall lymphadenopathy is
identified.

Reproductive: The bladder is mildly distended and grossly
unremarkable, though difficult to fully assess given metal artifact.
The patient is status post hysterectomy. No suspicious adnexal
masses are seen.

Other: No additional soft tissue abnormalities are seen.

Musculoskeletal: No acute osseous abnormalities are identified. Mild
facet disease is noted along the lumbar spine. The patient's left
hip arthroplasty is incompletely imaged, but appears grossly
unremarkable. The visualized musculature is unremarkable in
appearance.
IMPRESSION: 1. No acute abnormality seen within the abdomen or pelvis.
2. Trace right-sided pleural fluid noted. Lung bases otherwise
grossly clear.
3. Scattered aortic atherosclerosis.

## 2018-08-10 ENCOUNTER — Ambulatory Visit: Payer: Self-pay | Admitting: *Deleted

## 2018-08-10 NOTE — Telephone Encounter (Signed)
Rx was refilled 08/06/2018

## 2018-08-10 NOTE — Telephone Encounter (Signed)
Pt calling with complaints of a temperature of 100.4 approximately 20 min before calling the office and wanted to know if she should schedule an appt or seek treatment in the ED. Pt states she has been having a fever through the weekend but the highest temperature noted was 100.4. Pt states she was previously taking acetaminophen 600 mg for the fever but stopped taking it due to issues with her liver. Pt states she is unable to take ibuprofen due to issues with her liver. Pt was seen on 12/6 with Lauren,NP and was diagnosed with a dental abscess on the right side.Pt also has a referral to the oral surgeon. Pt was prescribed antibiotics during this visit and pt states she is still taking the antibiotics. Pt states that she is also remaining hydrated. Pt advised to make sure she is drinking plenty of fluids and to finish course of antibiotics. Pt advised to return call to the office if temperature increases over 101 and symptoms become worse or if no improvement seen in current symptoms. Pt verbalized understanding.  Reason for Disposition . [1] Intermittent fever > 100.0 F (37.8 C) AND [2] lasts > 3 weeks  Answer Assessment - Initial Assessment Questions 1. TEMPERATURE: "What is the most recent temperature?"  "How was it measured?"      100.4 taken approximately 20 min before calling the office 2. ONSET: "When did the fever start?"      Over the weekend 3. SYMPTOMS: "Do you have any other symptoms besides the fever?"  (e.g., colds, headache, sore throat, earache, cough, rash, diarrhea, vomiting, abdominal pain)     No 4. CAUSE: If there are no symptoms, ask: "What do you think is causing the fever?"      Recently diagnosed with right sided dental abscess on 08/06/18 and was prescribed antibiotics which she is currently taking 5. CONTACTS: "Does anyone else in the family have an infection?"     Not assessed 6. TREATMENT: "What have you done so far to treat this fever?" (e.g., medications)     Has been  taking Acetaminophen Arthritis 600 mg. Pt states she has issues with her liver and increased enzymes so she stopped taking it as she was previously advised 7. IMMUNOCOMPROMISE: "Do you have of the following: diabetes, HIV positive, splenectomy, cancer chemotherapy, chronic steroid treatment, transplant patient, etc."     Not assessed 8. PREGNANCY: "Is there any chance you are pregnant?" "When was your last menstrual period?"     n/a 9. TRAVEL: "Have you traveled out of the country in the last month?" (e.g., travel history, exposures)     Not assessed  Protocols used: FEVER-A-AH

## 2018-08-12 ENCOUNTER — Telehealth: Payer: Self-pay

## 2018-08-12 NOTE — Telephone Encounter (Signed)
Copied from [place] [id number]. Topic: General - Other >> Aug 12, 2018 11:35 AM Janace Aris A wrote: Reason for CRM: pt called in wanting to know if she can have a number to where the referral was sent to for the oral surgeon. She wants to follow up and try to schedule an appt.

## 2018-08-17 ENCOUNTER — Other Ambulatory Visit: Payer: Self-pay | Admitting: Family Medicine

## 2018-08-17 DIAGNOSIS — M62838 Other muscle spasm: Secondary | ICD-10-CM

## 2018-08-19 ENCOUNTER — Telehealth: Payer: Self-pay

## 2018-08-19 NOTE — Telephone Encounter (Signed)
Pt called Valerie Vazquez Patient calling to inquire if Dr. Caryl Bis or [place] knew of/could recommend another oral surgeon, as the office patient was referred to is too expensive. Patient states she will contact her insurance but wanted to seek advice from office. Please advise.

## 2018-08-19 NOTE — Telephone Encounter (Signed)
I do not know of any other oral surgeons though I can have our referral coordinator check to see if there are alternative options.  I will forward to Melissa to look into this.

## 2018-08-19 NOTE — Telephone Encounter (Signed)
Called Pt to tell her that Dr Caryl Bis put in a referral for a Pt to see a Chief Financial Officer. Okay for Pec to speak to Pt

## 2018-08-19 NOTE — Telephone Encounter (Signed)
Copied from [place] [id number]. Topic: Referral - Question >> Aug 19, 2018  2:30 PM Sheran Luz wrote: Patient calling to inquire if Dr. Caryl Bis or [place] knew of/could recommend another oral surgeon, as the office patient was referred to is too expensive. Patient states she will contact her insurance but wanted to seek advice from office. Please advise.

## 2018-08-26 IMAGING — US US EXTREM LOW VENOUS*L*
1 series · 13 of 24 positions shown · non-contrast
Comparison: None.

CLINICAL DATA: Left lower extremity pain. History of DVT, pulmonary
embolism and prior IVC filter placement.



[Series 1: us extrem low venous*left* · 0.06mm/px · 13 of 35 slices shown]
[im 1/35]
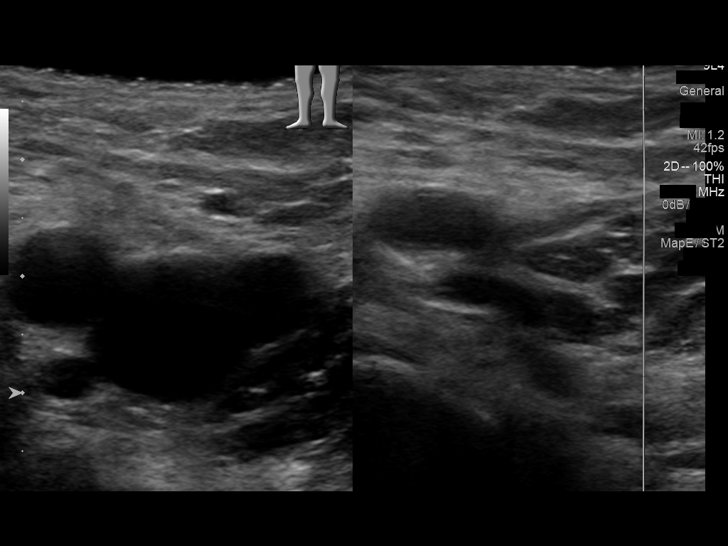
[im 3/35]
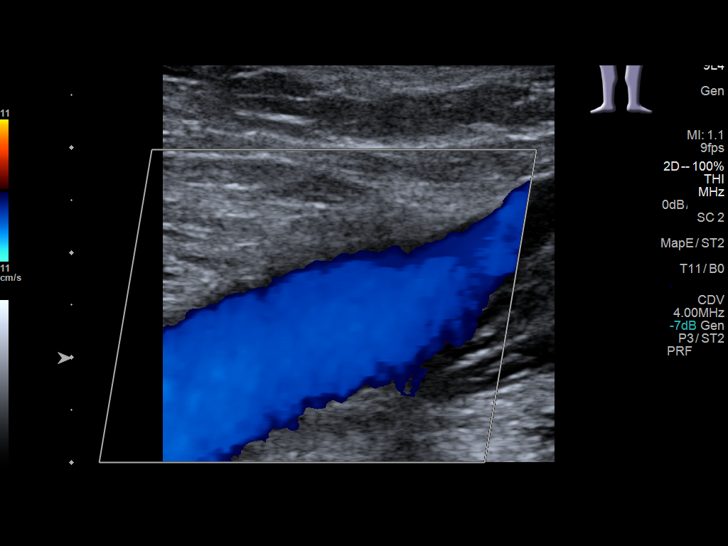
[im 6/35]
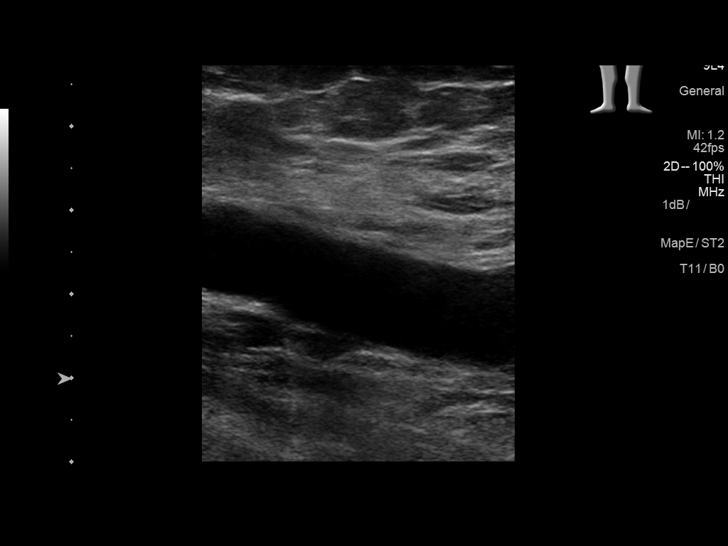
[im 9/35]
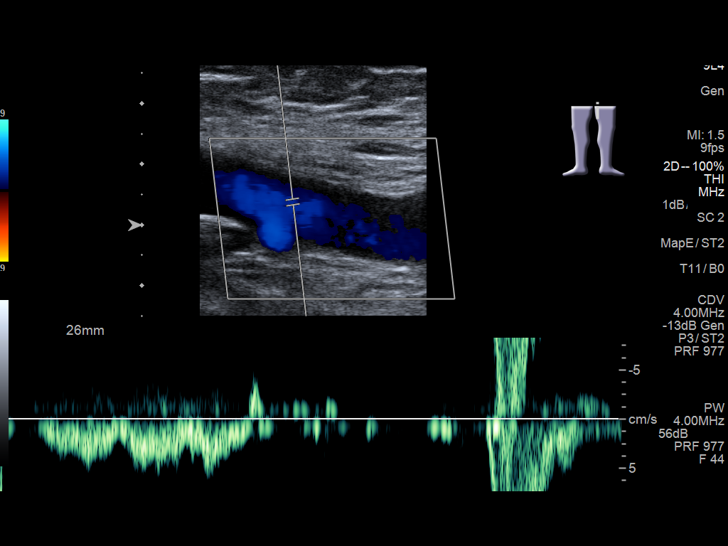
[im 12/35]
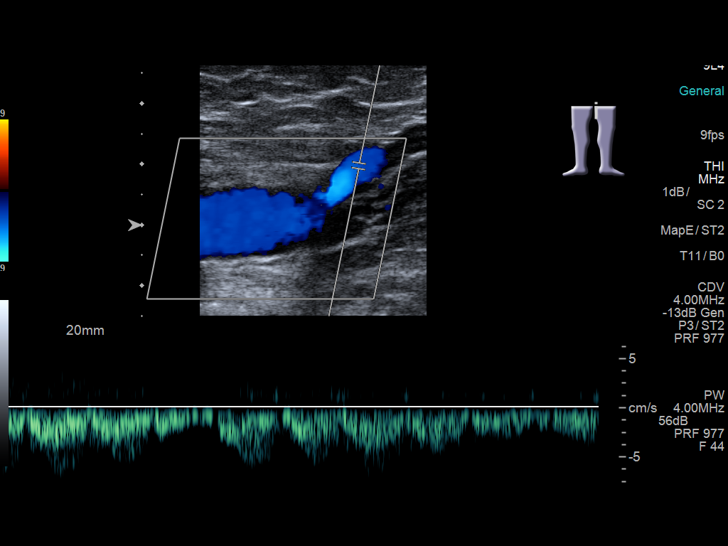
[im 15/35]
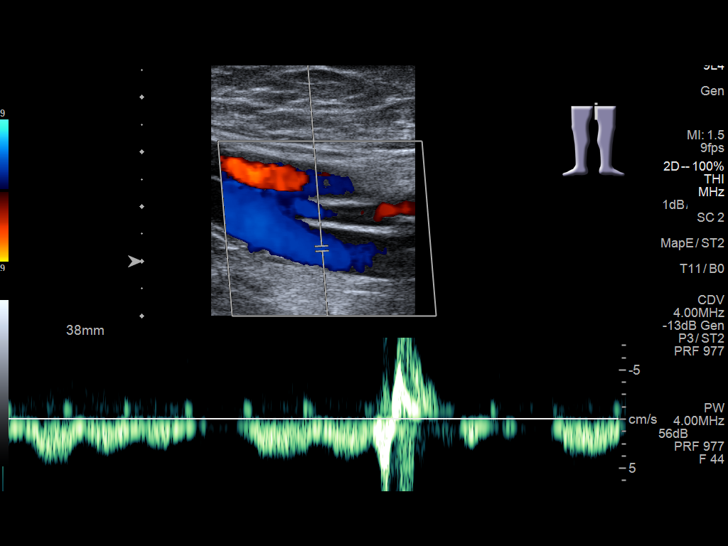
[im 18/35]
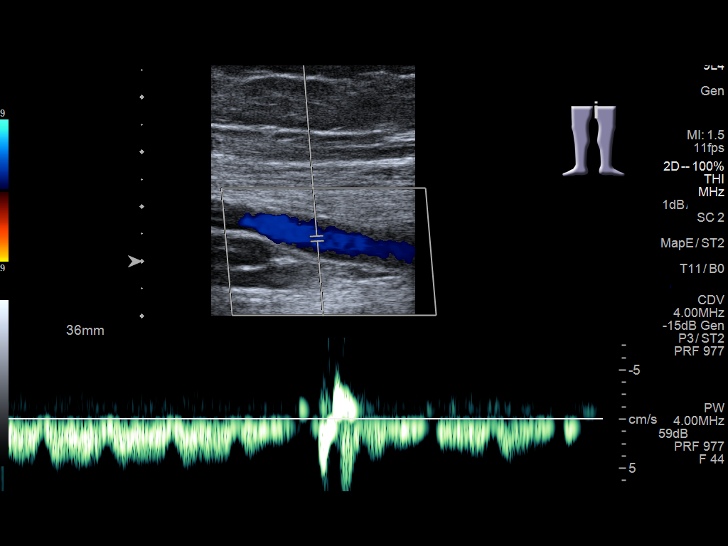
[im 20/35]
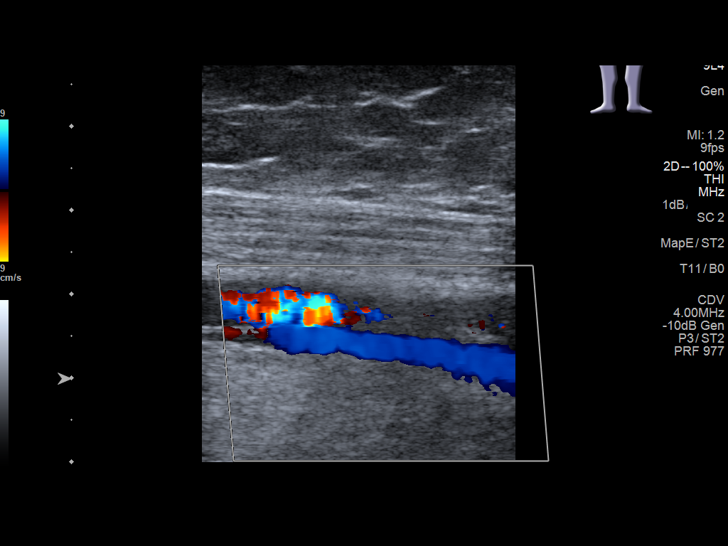
[im 23/35]
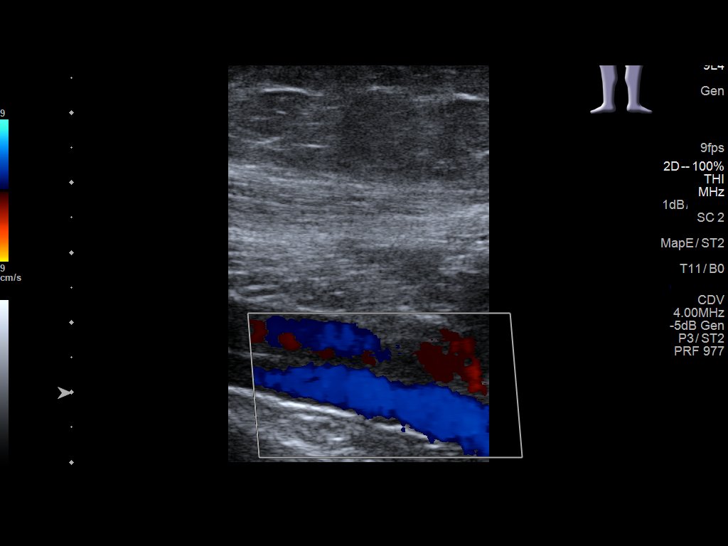
[im 26/35]
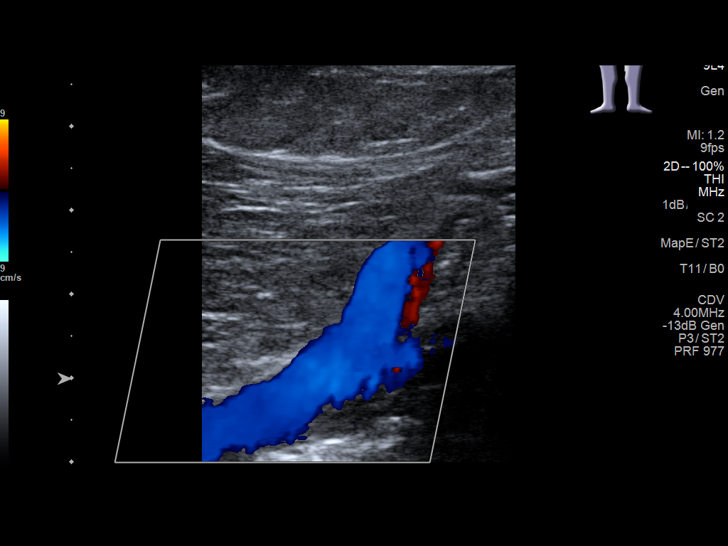
[im 29/35]
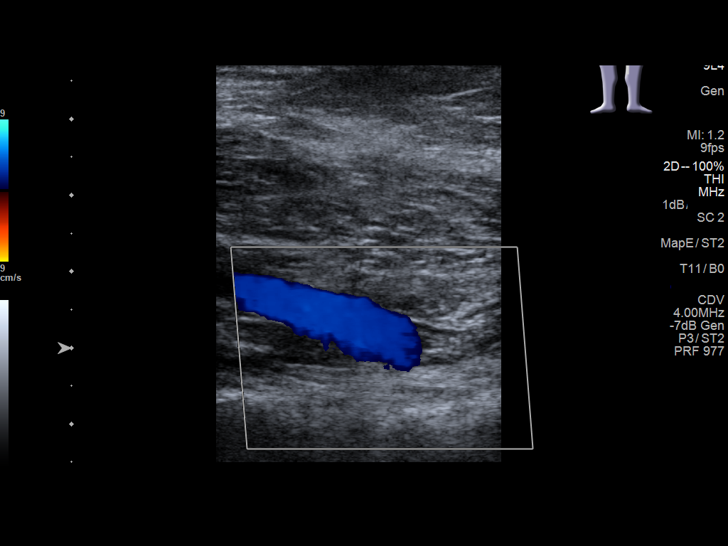
[im 32/35]
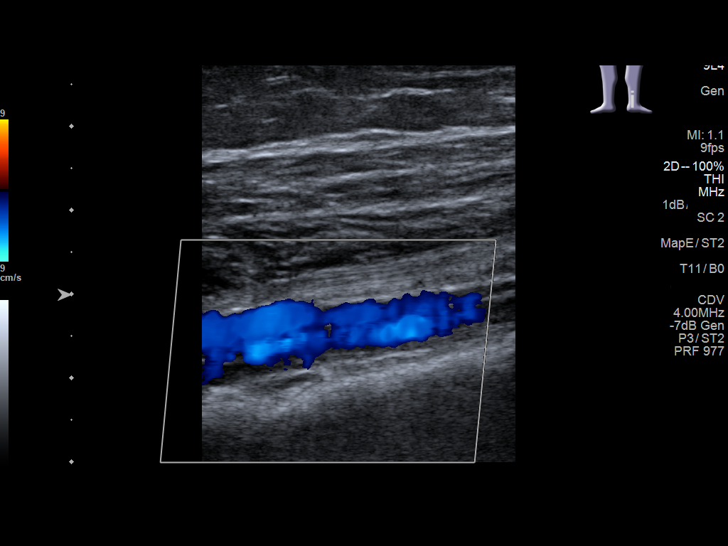
[im 35/35]
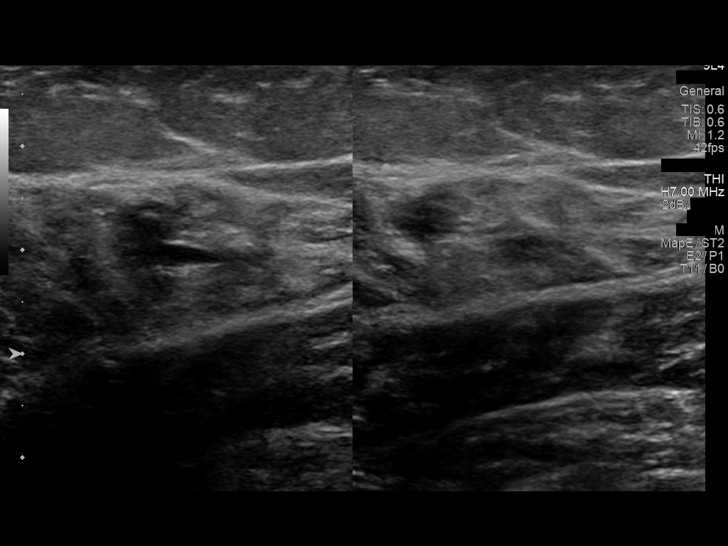

[13 of 24 positions shown; findings below may reference images not displayed]

FINDINGS: Contralateral Common Femoral Vein: Respiratory phasicity is normal
and symmetric with the symptomatic side. No evidence of thrombus.
Normal compressibility.

Common Femoral Vein: No evidence of thrombus. Normal
compressibility, respiratory phasicity and response to augmentation.

Saphenofemoral Junction: No evidence of thrombus. Normal
compressibility and flow on color Doppler imaging.

Profunda Femoral Vein: No evidence of thrombus. Normal
compressibility and flow on color Doppler imaging.

Femoral Vein: No evidence of thrombus. Normal compressibility,
respiratory phasicity and response to augmentation.

Popliteal Vein: No evidence of thrombus. Normal compressibility,
respiratory phasicity and response to augmentation.

Calf Veins: No evidence of thrombus. Normal compressibility and flow
on color Doppler imaging.

Superficial Great Saphenous Vein: No evidence of thrombus. Normal
compressibility and flow on color Doppler imaging.

Venous Reflux:  None.

Other Findings: No evidence of superficial thrombophlebitis or
abnormal fluid collection.
IMPRESSION: No evidence of left lower extremity deep venous thrombosis.

## 2018-08-30 ENCOUNTER — Ambulatory Visit (INDEPENDENT_AMBULATORY_CARE_PROVIDER_SITE_OTHER): Payer: Medicare HMO | Admitting: Family Medicine

## 2018-08-30 ENCOUNTER — Encounter: Payer: Self-pay | Admitting: Family Medicine

## 2018-08-30 VITALS — BP 102/70 | HR 97 | Temp 98.3°F | Ht 66.0 in | Wt 216.6 lb

## 2018-08-30 DIAGNOSIS — E119 Type 2 diabetes mellitus without complications: Secondary | ICD-10-CM | POA: Diagnosis not present

## 2018-08-30 DIAGNOSIS — I10 Essential (primary) hypertension: Secondary | ICD-10-CM | POA: Diagnosis not present

## 2018-08-30 DIAGNOSIS — R1011 Right upper quadrant pain: Secondary | ICD-10-CM

## 2018-08-30 DIAGNOSIS — Z8742 Personal history of other diseases of the female genital tract: Secondary | ICD-10-CM | POA: Diagnosis not present

## 2018-08-30 DIAGNOSIS — K047 Periapical abscess without sinus: Secondary | ICD-10-CM | POA: Diagnosis not present

## 2018-08-30 NOTE — Patient Instructions (Addendum)
Nice to see you. We will get lab work today and contact you with the results. Please call to schedule your mammogram. We will try to get you into see an oral surgeon though you do need to call dentists locally to try to be seen. If you develop swelling, pain, fevers, or drainage from your teeth please be evaluated immediately.

## 2018-08-31 LAB — COMPREHENSIVE METABOLIC PANEL
ALT: 13 U/L (ref 0–35)
AST: 15 U/L (ref 0–37)
Albumin: 3.9 g/dL (ref 3.5–5.2)
Alkaline Phosphatase: 95 U/L (ref 39–117)
BUN: 8 mg/dL (ref 6–23)
CO2: 24 mEq/L (ref 19–32)
Calcium: 9.1 mg/dL (ref 8.4–10.5)
Chloride: 107 mEq/L (ref 96–112)
Creatinine, Ser: 0.89 mg/dL (ref 0.40–1.20)
GFR: 82.01 mL/min (ref >60.00)
Glucose, Bld: 165 mg/dL — ABNORMAL HIGH (ref 70–99)
Potassium: 3.9 mEq/L (ref 3.5–5.1)
Sodium: 140 mEq/L (ref 135–145)
Total Bilirubin: 0.4 mg/dL (ref 0.2–1.2)
Total Protein: 6.7 g/dL (ref 6.0–8.3)

## 2018-08-31 LAB — CBC WITH DIFFERENTIAL/PLATELET
Basophils Absolute: 0.1 10*3/uL (ref 0.0–0.1)
Basophils Relative: 1 % (ref 0.0–3.0)
Eosinophils Absolute: 0.3 10*3/uL (ref 0.0–0.7)
Eosinophils Relative: 3.9 % (ref 0.0–5.0)
HCT: 35.7 % — ABNORMAL LOW (ref 36.0–46.0)
Hemoglobin: 11.9 g/dL — ABNORMAL LOW (ref 12.0–15.0)
Lymphocytes Relative: 30.7 % (ref 12.0–46.0)
Lymphs Abs: 2.4 10*3/uL (ref 0.7–4.0)
MCHC: 33.4 g/dL (ref 30.0–36.0)
MCV: 89.9 fl (ref 78.0–100.0)
Monocytes Absolute: 0.5 10*3/uL (ref 0.1–1.0)
Monocytes Relative: 7 % (ref 3.0–12.0)
Neutro Abs: 4.4 10*3/uL (ref 1.4–7.7)
Neutrophils Relative %: 57.4 % (ref 43.0–77.0)
Platelets: 342 10*3/uL (ref 150.0–400.0)
RBC: 3.97 Mil/uL (ref 3.87–5.11)
RDW: 14.7 % (ref 11.5–15.5)
WBC: 7.7 10*3/uL (ref 4.0–10.5)

## 2018-08-31 LAB — HEMOGLOBIN A1C: Hgb A1c MFr Bld: 7.5 % — ABNORMAL HIGH (ref 4.6–6.5)

## 2018-09-02 DIAGNOSIS — N83209 Unspecified ovarian cyst, unspecified side: Secondary | ICD-10-CM | POA: Insufficient documentation

## 2018-09-02 NOTE — Assessment & Plan Note (Signed)
Adequately controlled.  Continue current regimen. 

## 2018-09-02 NOTE — Progress Notes (Signed)
[name], MD Phone: [phone]  Danique Hartsough Kitko is a 63 y.o. female who presents today for follow-up.  CC: Diabetes, right upper quadrant pain, dental infection, hypertension  Diabetes: She is taking Januvia.  No polyuria or polydipsia.  No hypoglycemia.  Right upper quadrant pain: Patient notes recently having flashes of dull right upper quadrant discomfort.  Typically would occur after taking Tylenol.  She did have some nausea with it.  No vomiting.  No diarrhea.  She does have chronic constipation and feels she does not eliminate completely.  She passes hard stools.  She does use enemas to help pass stools.  She notes no dysuria.  She is status post hysterectomy, cholecystectomy, and appendectomy.  She does report a history of an ovarian cyst that has been present for decades.  This has not been reported on recent CT abdomen and pelvis.  Dental infection: Patient notes this has improved.  She completed the penicillin.  She notes she did have fever previously though none since.  No swelling.  Does note occasional discomfort in her teeth.  She has not been able to see a dentist.  The oral surgeons that she has been referred to require too much money upfront.  Hypertension: Taking carvedilol and Imdur.  No chest pain or shortness of breath.  She does note coughing the other night though at this has not recurred.  Social History   Tobacco Use  Smoking Status Former Smoker  . Packs/day: 1.50  . Years: 10.00  . Pack years: 15.00  . Types: Cigarettes  . Last attempt to quit: 04/12/1979  . Years since quitting: 39.4  Smokeless Tobacco Never Used     ROS see history of present illness  Objective  Physical Exam Vitals:   08/30/18 1445  BP: 102/70  Pulse: 97  Temp: 98.3 F (36.8 C)  SpO2: 95%    BP Readings from Last 3 Encounters:  08/30/18 102/70  08/06/18 120/78  07/17/18 (!) 144/83   Wt Readings from Last 3 Encounters:  08/30/18 216 lb 9.6 oz (98.2 kg)    08/06/18 222 lb 9.6 oz (101 kg)  07/17/18 219 lb (99.3 kg)    Physical Exam Constitutional:      General: She is not in acute distress.    Appearance: She is not diaphoretic.  HENT:     Mouth/Throat:     Comments: Poor dentition, multiple broken teeth, there is no evidence of swelling or drainage, no tenderness of the teeth in the right upper mouth where her pain has been occurring Cardiovascular:     Rate and Rhythm: Normal rate and regular rhythm.     Heart sounds: Normal heart sounds.  Pulmonary:     Effort: Pulmonary effort is normal.     Breath sounds: Normal breath sounds.  Abdominal:     General: Bowel sounds are normal. There is no distension.     Palpations: Abdomen is soft.     Tenderness: There is no abdominal tenderness. There is no guarding or rebound.  Skin:    General: Skin is warm and dry.  Neurological:     Mental Status: She is alert.      Assessment/Plan: Please see individual problem list.  Essential hypertension Adequately controlled.  Continue current regimen.  Diabetes mellitus without complication (HCC) Check A1c.  Continue current medication.  History of ovarian cyst Ultrasound ordered to evaluate.  Right upper quadrant pain Intermittent issues with this recently.  Seems to be associated with Tylenol use.  She will avoid this.  We will check lab work.  Potentially could be related to constipation as well.  Dental infection Prior symptoms concerning for dental infection.  We will try to refer her to [facility] for an oral surgeon to see if that would be a more affordable option.  She will also check with her dental insurance about what dentists are covered locally so she can at least see them.  This issue is complicated by her history of hypercementosis.   Orders Placed This Encounter  Procedures  . US Pelvic Complete With Transvaginal    Standing Status:   Future    Standing Expiration Date:   10/30/2019    Order Specific Question:   Reason for  Exam (SYMPTOM  OR DIAGNOSIS REQUIRED)    Answer:   history left ovarian cyst, no recent follow-up on this    Order Specific Question:   Preferred imaging location?    Answer:   [facility]  . HgB A1c  . Comp Met (CMET)  . CBC w/Diff    No orders of the defined types were placed in this encounter.    [name], MD [place]

## 2018-09-02 NOTE — Assessment & Plan Note (Signed)
Ultrasound ordered to evaluate.

## 2018-09-02 NOTE — Assessment & Plan Note (Signed)
Intermittent issues with this recently.  Seems to be associated with Tylenol use.  She will avoid this.  We will check lab work.  Potentially could be related to constipation as well.

## 2018-09-02 NOTE — Assessment & Plan Note (Signed)
Check A1c.  Continue current medication. °

## 2018-09-02 NOTE — Assessment & Plan Note (Signed)
Prior symptoms concerning for dental infection.  We will try to refer her to [facility] for an oral surgeon to see if that would be a more affordable option.  She will also check with her dental insurance about what dentists are covered locally so she can at least see them.  This issue is complicated by her history of hypercementosis.

## 2018-09-04 ENCOUNTER — Other Ambulatory Visit: Payer: Self-pay | Admitting: Family Medicine

## 2018-09-04 DIAGNOSIS — E119 Type 2 diabetes mellitus without complications: Secondary | ICD-10-CM

## 2018-09-04 MED ORDER — SITAGLIPTIN PHOSPHATE 100 MG PO TABS: 100.0000 mg | ORAL_TABLET | Freq: Every day | ORAL | 1 refills | Status: DC

## 2018-09-06 ENCOUNTER — Other Ambulatory Visit: Payer: Self-pay | Admitting: Family Medicine

## 2018-09-06 ENCOUNTER — Telehealth: Payer: Self-pay | Admitting: Family Medicine

## 2018-09-06 DIAGNOSIS — M62838 Other muscle spasm: Secondary | ICD-10-CM

## 2018-09-06 DIAGNOSIS — K224 Dyskinesia of esophagus: Secondary | ICD-10-CM

## 2018-09-06 NOTE — Telephone Encounter (Signed)
This appears to have been given for acute symptoms.  Please contact the patient and see why she continues to need this.  Thanks.

## 2018-09-06 NOTE — Telephone Encounter (Signed)
Copied from [place] [id number]. Topic: Quick Communication - Rx Refill/Question >> Sep 06, 2018  2:01 PM Oneta Rack wrote: Medication: traMADol (ULTRAM) 50 MG tablet  Has the patient contacted their pharmacy? No   (Agent: If yes, when and what did the pharmacy advise?) patient was seen 09/04/2018 and stated it was discussed PCP would prescribed   Preferred Pharmacy (with phone number or street name):  [id number] [name], [place] - [place] [phone] (Phone) [phone] (Fax)   Agent: Please be advised that RX refills may take up to 3 business days. We ask that you follow-up with your pharmacy.

## 2018-09-06 NOTE — Telephone Encounter (Signed)
Last OV 08/30/2018   Last refilled 08/06/2018 disp 15 with no refills   Sent to PCP for approval

## 2018-09-07 ENCOUNTER — Other Ambulatory Visit: Payer: Self-pay | Admitting: Psychiatry

## 2018-09-07 ENCOUNTER — Telehealth: Payer: Self-pay | Admitting: *Deleted

## 2018-09-07 ENCOUNTER — Telehealth: Payer: Self-pay

## 2018-09-07 DIAGNOSIS — F5105 Insomnia due to other mental disorder: Secondary | ICD-10-CM

## 2018-09-07 DIAGNOSIS — M159 Polyosteoarthritis, unspecified: Secondary | ICD-10-CM

## 2018-09-07 DIAGNOSIS — F99 Mental disorder, not otherwise specified: Principal | ICD-10-CM

## 2018-09-07 NOTE — Telephone Encounter (Signed)
Copied from [place] [id number]. Topic: Quick Communication - See Telephone Encounter >> Sep 07, 2018  3:45 PM Myriam Forehand, [place] wrote: CRM for notification. See Telephone encounter for: 09/07/18. >> Sep 07, 2018  4:10 PM Yvette Rack wrote: Pt stated she was taking the Tramadol for joint pain. Pt stated it was beneficial because it helped with the joint pain as well as the muscle pain from all the coughing she was doing.

## 2018-09-07 NOTE — Telephone Encounter (Signed)
LMTCB to see patient was ok with switching back to [place].

## 2018-09-07 NOTE — Telephone Encounter (Signed)
Pt calling checking in on the status of her refill request for tramadol   Last refilled 08/06/2018 disp 15 with no refills   Sent to PCP for approval.

## 2018-09-07 NOTE — Telephone Encounter (Signed)
Patient was previously on diltiazem for her esophageal spasms. Please contact the patient and see if she would be willing to go back on diltiazem. This change was previously made due to diltiazem being on back order. If she is willing to change back I will send in diltiazem. If she is not willing to change back please find out how she is currently taking the nifedipine. Thanks.

## 2018-09-07 NOTE — Telephone Encounter (Signed)
Called patient and left a VM to call back. CRM created and sent to [facility] pool.  

## 2018-09-07 NOTE — Telephone Encounter (Signed)
Please see the prior refill request. It appears that this was a short term medication prescribed by Lauren for joint pain. Please see if this was beneficial and confirm what she was taking this for. I will then consider refilling.  Thanks.

## 2018-09-08 ENCOUNTER — Ambulatory Visit
Admission: RE | Admit: 2018-09-08 | Discharge: 2018-09-08 | Disposition: A | Discharge status: Home / Self Care | Payer: Medicare HMO | Source: Ambulatory Visit | Attending: Family Medicine | Admitting: Family Medicine

## 2018-09-08 ENCOUNTER — Other Ambulatory Visit: Payer: Self-pay | Admitting: Family Medicine

## 2018-09-08 DIAGNOSIS — N83292 Other ovarian cyst, left side: Secondary | ICD-10-CM | POA: Diagnosis not present

## 2018-09-08 DIAGNOSIS — N83202 Unspecified ovarian cyst, left side: Principal | ICD-10-CM

## 2018-09-08 DIAGNOSIS — Z8742 Personal history of other diseases of the female genital tract: Secondary | ICD-10-CM

## 2018-09-08 DIAGNOSIS — N83201 Unspecified ovarian cyst, right side: Secondary | ICD-10-CM

## 2018-09-08 MED ORDER — TRAMADOL HCL 50 MG PO TABS: 50.0000 mg | ORAL_TABLET | Freq: Three times a day (TID) | ORAL | 0 refills | Status: DC | PRN

## 2018-09-08 NOTE — Telephone Encounter (Signed)
See other phone note

## 2018-09-08 NOTE — Telephone Encounter (Signed)
Sent to PCP ?

## 2018-09-08 NOTE — Telephone Encounter (Signed)
Short-term refill sent to pharmacy.  Controlled substance database reviewed.

## 2018-09-08 NOTE — Telephone Encounter (Signed)
Per pt " Pt stated she was taking the Tramadol for joint pain. Pt stated it was beneficial because it helped with the joint pain as well as the muscle pain from all the coughing she was doing. "  Sent to PCP to advise

## 2018-09-08 NOTE — Telephone Encounter (Signed)
Called and spoke with patient. Pt advised and voiced understanding.  

## 2018-09-10 ENCOUNTER — Encounter: Payer: Self-pay | Admitting: Family Medicine

## 2018-09-17 ENCOUNTER — Encounter: Payer: Self-pay | Admitting: Obstetrics and Gynecology

## 2018-09-19 ENCOUNTER — Other Ambulatory Visit: Payer: Self-pay | Admitting: Family Medicine

## 2018-09-19 DIAGNOSIS — M62838 Other muscle spasm: Secondary | ICD-10-CM

## 2018-09-21 ENCOUNTER — Other Ambulatory Visit: Payer: Self-pay | Admitting: Family Medicine

## 2018-09-21 ENCOUNTER — Encounter: Payer: Medicare HMO | Admitting: Obstetrics and Gynecology

## 2018-09-21 DIAGNOSIS — R110 Nausea: Secondary | ICD-10-CM

## 2018-09-21 MED ORDER — ONDANSETRON HCL 4 MG PO TABS: 4.0000 mg | ORAL_TABLET | Freq: Two times a day (BID) | ORAL | 0 refills | Status: DC | PRN

## 2018-09-21 MED ORDER — DICYCLOMINE HCL 20 MG PO TABS: ORAL_TABLET | ORAL | 0 refills | Status: DC

## 2018-09-21 NOTE — Telephone Encounter (Signed)
I have called & left patient VM again to call back.

## 2018-09-21 NOTE — Telephone Encounter (Signed)
Copied from [place] [id number]. Topic: Quick Communication - Rx Refill/Question >> Sep 21, 2018 11:20 AM Alfredia Ferguson R wrote: Medication: ondansetron (ZOFRAN) 4 MG tablet  , dicyclomine (BENTYL) 20 MG tablet  Has the patient contacted their pharmacy?No Preferred Pharmacy (with phone number or street name): [id number] [name], [place] [phone] (Phone) [phone] (Fax)    Agent: Please be advised that RX refills may take up to 3 business days. We ask that you follow-up with your pharmacy.

## 2018-09-21 NOTE — Telephone Encounter (Signed)
Requested medication (s) are due for refill today:  yes  Requested medication (s) are on the active medication list:  yes  Future visit scheduled:  yes  Last Refill: Ondansetron 05/28/18; no refills                   Dicyclomine 08/28/17; #270; no refills   Requested Prescriptions  Pending Prescriptions Disp Refills   ondansetron (ZOFRAN) 4 MG tablet 30 tablet 0    Sig: Take 1 tablet (4 mg total) by mouth 2 (two) times daily as needed for nausea or vomiting.     Not Delegated - Gastroenterology: Antiemetics Failed - 09/21/2018 11:24 AM      Failed - This refill cannot be delegated      Passed - Valid encounter within last 6 months    Recent Outpatient Visits          3 weeks ago Right upper quadrant pain   [place], Angela Adam, MD   1 month ago Dental infection   [place] G[place], Jacquelynn Cree, FNP   3 months ago Acute pain of left knee   [place] G[place], Jacquelynn Cree, FNP   5 months ago Muscle tear   [place] McLean-Scocuz[place], Nino Glow, MD   6 months ago Diabetes mellitus without complication [facility])   [place] C[place] Bis, Angela Adam, MD      Future Appointments            In 2 months Caryl Bis, Angela Adam, MD [facility], [place]   In 4 months Caryl Bis, Angela Adam, MD [place], [place]   In 4 months O'Brien-Blaney, Denisa L, LPN [facility], [facility]          dicyclomine (BENTYL) 20 MG tablet 270 tablet 0    Sig: TAKE 1 TABLET BY MOUTH 3 TIMES A DAY AS NEEDED FOR SPASMS     Gastroenterology:  Antispasmodic Agents Passed - 09/21/2018 11:24 AM      Passed - Last Heart Rate in normal range    Pulse Readings from Last 1 Encounters:  03/15/18 72         Passed - Valid encounter within last 12 months    Recent Outpatient Visits          3 weeks ago Right upper quadrant pain   [facility] [place], Angela Adam, MD   1 month ago Dental infection   [place], FNP   3 months ago Acute pain of left knee   [place]use, Jacquelynn Cree, FNP   5 months ago Muscle tear   [place] McLean-Scocuzza, Nino Glow, MD   6 months ago Diabetes mellitus without complication [facility])   [place], Angela Adam, MD      Future Appointments            In 2 months Caryl Bis, Angela Adam, MD [facility], [place]   In 4 months Caryl Bis, Angela Adam, MD [facility], [place]   In 4 months O'Brien-Blaney, Bryson [place], LPN [place], [facility]

## 2018-09-22 ENCOUNTER — Encounter: Payer: Self-pay | Admitting: Family Medicine

## 2018-09-22 DIAGNOSIS — K034 Hypercementosis: Secondary | ICD-10-CM

## 2018-09-22 DIAGNOSIS — K224 Dyskinesia of esophagus: Secondary | ICD-10-CM

## 2018-09-23 ENCOUNTER — Other Ambulatory Visit: Payer: Self-pay | Admitting: Family Medicine

## 2018-09-23 DIAGNOSIS — E785 Hyperlipidemia, unspecified: Secondary | ICD-10-CM

## 2018-09-27 ENCOUNTER — Telehealth: Payer: Self-pay

## 2018-09-27 NOTE — Telephone Encounter (Signed)
Copied from [place] [id number]. Topic: General - Other >> Sep 27, 2018 12:07 PM Windy Kalata wrote: Reason for CRM: Patient is asking for an antibiotic she states that her face is swollen today from her teeth she states that Dr. Caryl Bis is aware of the issue and she is not able to get into the dentist until 10/25/18. Please advise   Best call back is [phone]

## 2018-09-27 NOTE — Telephone Encounter (Signed)
Scheduled

## 2018-09-27 NOTE — Telephone Encounter (Signed)
Called and spoke with patient. Pt advise that she has been scheduled for an appt at 4:30 09/28/2018. Pt is aware that she has been scheduled for this appt.   Sent to [place] please schedule her for this sameday appt ASAP.   Thanks

## 2018-09-27 NOTE — Telephone Encounter (Signed)
She needs to be seen to determine if antibiotics are appropriate and if they are to determine if antibiotics by mouth are appropriate.

## 2018-09-27 NOTE — Telephone Encounter (Signed)
Sent to PCP please advise.  

## 2018-09-28 ENCOUNTER — Ambulatory Visit (INDEPENDENT_AMBULATORY_CARE_PROVIDER_SITE_OTHER): Payer: Medicare HMO | Admitting: Family Medicine

## 2018-09-28 ENCOUNTER — Encounter: Payer: Self-pay | Admitting: Family Medicine

## 2018-09-28 VITALS — BP 116/66 | HR 83 | Temp 98.8°F | Ht 66.0 in | Wt 219.4 lb

## 2018-09-28 DIAGNOSIS — R0989 Other specified symptoms and signs involving the circulatory and respiratory systems: Secondary | ICD-10-CM | POA: Insufficient documentation

## 2018-09-28 DIAGNOSIS — K047 Periapical abscess without sinus: Secondary | ICD-10-CM | POA: Diagnosis not present

## 2018-09-28 DIAGNOSIS — R0689 Other abnormalities of breathing: Secondary | ICD-10-CM | POA: Diagnosis not present

## 2018-09-28 DIAGNOSIS — E119 Type 2 diabetes mellitus without complications: Secondary | ICD-10-CM | POA: Diagnosis not present

## 2018-09-28 DIAGNOSIS — Z1239 Encounter for other screening for malignant neoplasm of breast: Secondary | ICD-10-CM

## 2018-09-28 DIAGNOSIS — N83209 Unspecified ovarian cyst, unspecified side: Secondary | ICD-10-CM

## 2018-09-28 MED ORDER — BLOOD GLUCOSE MONITOR KIT: PACK | 0 refills | Status: DC

## 2018-09-28 MED ORDER — CLINDAMYCIN HCL 150 MG PO CAPS: 450.0000 mg | ORAL_CAPSULE | Freq: Three times a day (TID) | ORAL | 0 refills | Status: DC

## 2018-09-28 MED ORDER — TETANUS-DIPHTH-ACELL PERTUSSIS 5-2.5-18.5 LF-MCG/0.5 IM SUSP: 0.5000 mL | Freq: Once | INTRAMUSCULAR | 0 refills | Status: AC

## 2018-09-28 NOTE — Assessment & Plan Note (Signed)
We will send in a prescription for a glucometer.  She will start checking once daily.  She will continue her current regimen.  She will complete a urine microalbumin.  The next time she sees the ophthalmologist she will have them send [place] their note.

## 2018-09-28 NOTE — Assessment & Plan Note (Signed)
Patient's oxygen saturation is in the low normal range.  She is asymptomatic.  She will monitor for symptoms.

## 2018-09-28 NOTE — Assessment & Plan Note (Signed)
Patient did not have complete resolution with penicillin.  Will trial clindamycin.  Discussed the need to see a dentist.  Will order an orthopantogram and if there are findings of abscess we will try to get her dental appointment moved up.

## 2018-09-28 NOTE — Assessment & Plan Note (Signed)
Noted on recent ultrasound.  She will keep her appointment with gynecology.

## 2018-09-28 NOTE — Patient Instructions (Signed)
Nice to see you. Please go get [facility]. Please take the clindamycin. Please call to schedule your mammogram. If you develop increased swelling, fevers, or you begin to feel ill or have increasing pain please go to the emergency room.

## 2018-09-28 NOTE — Progress Notes (Signed)
[name], MD Phone: [phone]  Valerie Vazquez is a 63 y.o. female who presents today for follow-up.  CC: Dental infection, diabetes, ovarian cyst  Dental infection: Patient notes that her right maxillary dental area has had some swelling and prior discomfort.  She questions whether or not she has had drainage.  No fevers.  Feels a fullness in her face and swollen lymph nodes.  She has had this previously and was treated with penicillin though she reports the symptoms never completely resolved.  She has been unable to get into see an oral surgeon or a dentist due to cost concerns.  She does have poor dentition and has an appointment [facility] [place] [facility] in late February.  Diabetes: She has started on Januvia.  She is not checking blood sugars.  She notes no polyuria or polydipsia.  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  She reports having seen an ophthalmologist in the last year.  She has an upcoming appointment with GYN regarding an ovarian cyst.  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  She reports not having had Pap smears in some time.  Oxygen noted to be low normal range.  Patient reports no cough, wheezing, or shortness of breath.  On review it appears that she has been around 94 to 95% previously.  She does have a smoking history though no history of COPD or asthma.  Social History   Tobacco Use  Smoking Status Former Smoker  . Packs/day: 1.50  . Years: 10.00  . Pack years: 15.00  . Types: Cigarettes  . Last attempt to quit: 04/12/1979  . Years since quitting: 39.4  Smokeless Tobacco Never Used     ROS see history of present illness  Objective  Physical Exam Vitals:   09/28/18 1641  BP: 116/66  Pulse: 83  Temp: 98.8 F (37.1 C)  SpO2: 92%    BP Readings from Last 3 Encounters:  09/28/18 116/66  08/30/18 102/70  08/06/18 120/78   Wt Readings from Last 3 Encounters:  09/28/18 219 lb 6.4 oz (99.5 kg)  08/30/18  216 lb 9.6 oz (98.2 kg)  08/06/18 222 lb 9.6 oz (101 kg)    Physical Exam Constitutional:      General: She is not in acute distress.    Appearance: She is not diaphoretic.  HENT:     Head: Normocephalic and atraumatic.     Mouth/Throat:     Comments: Poor dentition with numerous broken molars or molars that have been previously removed, there is no tenderness to the gums or teeth in the upper right portion of her mouth, there is tenderness over palpation externally with mild swelling though no erythema, no cervical lymphadenopathy Cardiovascular:     Rate and Rhythm: Normal rate and regular rhythm.     Heart sounds: Normal heart sounds.  Pulmonary:     Effort: Pulmonary effort is normal.     Breath sounds: Normal breath sounds.  Lymphadenopathy:     Cervical: No cervical adenopathy.  Skin:    General: Skin is warm and dry.  Neurological:     Mental Status: She is alert.      Assessment/Plan: Please see individual problem list.  Dental infection Patient did not have complete resolution with penicillin.  Will trial clindamycin.  Discussed the need to see a dentist.  Will order an orthopantogram and if there are findings of abscess we will try to get her dental appointment moved up.  Diabetes mellitus without complication ([place]) We will send in a prescription for a glucometer.  She will start checking once daily.  She will continue her current regimen.  She will complete a urine microalbumin.  The next time she sees the ophthalmologist she will have them send [place] their note.  Ovarian cyst Noted on recent ultrasound.  She will keep her appointment with gynecology.  Oxygen saturation greater than 90% Patient's oxygen saturation is in the low normal range.  She is asymptomatic.  She will monitor for symptoms.    Health Maintenance: Tdap prescription sent to pharmacy.  Mammogram ordered.  Patient given number to call.  Orders Placed This Encounter  Procedures  . MM 3D SCREEN  BREAST BILATERAL    Standing Status:   Future    Standing Expiration Date:   11/27/2019    Order Specific Question:   Reason for Exam (SYMPTOM  OR DIAGNOSIS REQUIRED)    Answer:   breast cancer screening    Order Specific Question:   Preferred imaging location?    Answer:   [facility]  . DG Orthopantogram    Standing Status:   Future    Standing Expiration Date:   11/27/2019    Order Specific Question:   Reason for Exam (SYMPTOM  OR DIAGNOSIS REQUIRED)    Answer:   dental pain and right maxillary swelling    Order Specific Question:   Preferred imaging location?    Answer:   [facility]    Order Specific Question:   Radiology Contrast Protocol - do NOT remove file path    Answer:   _[email or web address]  . Urine Microalbumin w/creat. ratio    Standing Status:   Future    Standing Expiration Date:   09/29/2019    Meds ordered this encounter  Medications  . clindamycin (CLEOCIN) 150 MG capsule    Sig: Take 3 capsules (450 mg total) by mouth 3 (three) times daily.    Dispense:  90 capsule    Refill:  0  . Tdap (BOOSTRIX) 5-2.5-18.5 LF-MCG/0.5 injection    Sig: Inject 0.5 mLs into the muscle once for 1 dose.    Dispense:  0.5 mL    Refill:  0  . blood glucose meter kit and supplies KIT    Sig: Dispense based on patient and insurance preference. Use once daily as directed.  For ICD 10 code E11.9.    Dispense:  1 each    Refill:  0    Order Specific Question:   Number of strips    Answer:   100    Order Specific Question:   Number of lancets    Answer:   [place], MD [place]

## 2018-09-29 ENCOUNTER — Other Ambulatory Visit: Payer: Self-pay

## 2018-09-29 ENCOUNTER — Emergency Department: Payer: Medicare HMO

## 2018-09-29 ENCOUNTER — Ambulatory Visit: Payer: Self-pay | Admitting: Gastroenterology

## 2018-09-29 ENCOUNTER — Emergency Department
Admission: EM | Admit: 2018-09-29 | Discharge: 2018-09-29 | Disposition: A | Discharge status: Home / Self Care | Payer: Medicare HMO | Attending: Emergency Medicine | Admitting: Emergency Medicine

## 2018-09-29 DIAGNOSIS — I2510 Atherosclerotic heart disease of native coronary artery without angina pectoris: Secondary | ICD-10-CM | POA: Insufficient documentation

## 2018-09-29 DIAGNOSIS — E119 Type 2 diabetes mellitus without complications: Secondary | ICD-10-CM | POA: Insufficient documentation

## 2018-09-29 DIAGNOSIS — Z7984 Long term (current) use of oral hypoglycemic drugs: Secondary | ICD-10-CM | POA: Insufficient documentation

## 2018-09-29 DIAGNOSIS — Z79899 Other long term (current) drug therapy: Secondary | ICD-10-CM | POA: Insufficient documentation

## 2018-09-29 DIAGNOSIS — Z96642 Presence of left artificial hip joint: Secondary | ICD-10-CM | POA: Diagnosis not present

## 2018-09-29 DIAGNOSIS — M545 Low back pain: Secondary | ICD-10-CM | POA: Insufficient documentation

## 2018-09-29 DIAGNOSIS — M25551 Pain in right hip: Secondary | ICD-10-CM | POA: Diagnosis not present

## 2018-09-29 DIAGNOSIS — I10 Essential (primary) hypertension: Secondary | ICD-10-CM | POA: Insufficient documentation

## 2018-09-29 DIAGNOSIS — Z7901 Long term (current) use of anticoagulants: Secondary | ICD-10-CM | POA: Insufficient documentation

## 2018-09-29 DIAGNOSIS — R109 Unspecified abdominal pain: Secondary | ICD-10-CM | POA: Insufficient documentation

## 2018-09-29 DIAGNOSIS — M1611 Unilateral primary osteoarthritis, right hip: Secondary | ICD-10-CM | POA: Insufficient documentation

## 2018-09-29 DIAGNOSIS — Z87891 Personal history of nicotine dependence: Secondary | ICD-10-CM | POA: Insufficient documentation

## 2018-09-29 LAB — URINALYSIS, COMPLETE (UACMP) WITH MICROSCOPIC
Bilirubin Urine: NEGATIVE
Glucose, UA: 50 mg/dL — AB
Hgb urine dipstick: NEGATIVE
Ketones, ur: NEGATIVE mg/dL
Leukocytes, UA: NEGATIVE
Nitrite: NEGATIVE
Protein, ur: NEGATIVE mg/dL
Specific Gravity, Urine: 1.029 (ref 1.005–1.030)
pH: 5 (ref 5.0–8.0)

## 2018-09-29 MED ORDER — LIDOCAINE 5 % EX PTCH: 1.0000 | MEDICATED_PATCH | CUTANEOUS | 0 refills | Status: DC

## 2018-09-29 MED ORDER — ACETAMINOPHEN 500 MG PO TABS: 500.0000 mg | ORAL_TABLET | Freq: Four times a day (QID) | ORAL | 0 refills | Status: DC | PRN

## 2018-09-29 MED ORDER — ACETAMINOPHEN 325 MG PO TABS
650.0000 mg | ORAL_TABLET | Freq: Once | ORAL | Status: AC
  Administered 2018-09-29: 650 mg via ORAL
  Filled 2018-09-29: qty 2

## 2018-09-29 MED ORDER — LIDOCAINE 5 % EX PTCH
1.0000 | MEDICATED_PATCH | CUTANEOUS | Status: DC
  Administered 2018-09-29: 1 via TRANSDERMAL
  Filled 2018-09-29: qty 1

## 2018-09-29 NOTE — ED Provider Notes (Signed)
[facility] Emergency Department Provider Note  ____________________________________________  Time seen: Approximately 1:53 PM  I have reviewed the triage vital signs and the nursing notes.   HISTORY  Chief Complaint Back Pain    HPI Valerie Vazquez is a 63 y.o. female that presents emergency department for evaluation of low right back and hip pain for 2 days.  Patient states the pain initially started out as a twinge.  Pain starts in her hip and occasionally radiates into her side, groin, or back. Certain movements make pain worse.  No trauma or recent falls.  No urinary symptoms or hematuria.  She has never had a kidney stone.  No bowel or bladder dysfunction or saddle anesthesias.  Patient began clindamycin today for a dental infection.  No nausea, vomiting, abdominal pain, urinary symptoms.  Past Medical History:  Diagnosis Date  . Acute right-sided low back pain with right-sided sciatica 10/23/2017  . Anemia   . Bilateral renal cysts   . CAD (coronary artery disease) 09/28/2016  . Chest pain 10/14/2015  . Chronic back pain    "upper and lower" (09/19/2014)  . Chronic shoulder pain (Location of Primary Source of Pain) (Left) 01/29/2016  . Colitis 04/08/2016  . Coronary vasospasm ([place]) 12/09/2014  . Depression    "major" (09/19/2014)  . DVT (deep venous thrombosis) ([place])   . Esophageal spasm   . Essential hypertension 10/28/2013  . Gallstones   . Gastritis   . Gastroenteritis   . GERD (gastroesophageal reflux disease)   . Headache    "couple /month lately" (09/19/2014)  . Heart disease 12/09/2016  . High blood pressure   . High cholesterol   . History of blood transfusion 1985   related to hysterectomy  . History of nuclear stress test    Myoview 1/19:  EF 65, no ischemia or scar  . History of pulmonary embolus (PE) 09/19/2014  . 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 Rule out Paget's disease of the bone with blood work.   .  Hyperlipidemia 10/28/2013  . Hypertension   . IBS (irritable bowel syndrome)   . Lumbar central spinal stenosis (moderate at L4-5; mild at L2-3 and L3-4) 01/29/2016  . Migraine    "a few times/yr" (09/19/2014)  . Myocardial infarction ([place]) 10/2010 X 3   "while hospitalized"  . Neck pain 01/29/2016  . Osteoarthritis    "qwhere; mostly around my joints" (09/19/2014)  . Pleural effusion, bilateral 07/31/2015  . Pneumonia 04/2014  . PTSD (post-traumatic stress disorder)   . Pulmonary embolism ([place]) 04/2001; 09/19/2014   "after gallbladder OR; "  . Schizoaffective disorder ([place])   . Schizoaffective disorder, depressive type ([place]) 08/28/2016  . Sleep apnea    "mild" (09/19/2014)  . Snoring    a. sleep study 5/16: No OSA  . SOB (shortness of breath) 07/31/2015  . Spinal stenosis   . Spondylosis   . Suicide attempt ([place]) 08/27/2016  . Type II diabetes mellitus ([place])    "dx'd in 2007; lost weight; no RX for ~ 4 yr now" (09/19/2014)  . Wheezing 02/25/2017    Patient Active Problem List   Diagnosis Date Noted  . Oxygen saturation greater than 90% 09/28/2018  . Ovarian cyst 09/02/2018  . Orthostatic hypotension 03/21/2018  . Dental infection 03/21/2018  . Boil 10/27/2017  . Urinary incontinence 10/27/2017  . Elevated alkaline phosphatase level 10/27/2017  . Acute right-sided low back pain with right-sided sciatica 10/23/2017  . Subclinical hypothyroidism 08/26/2017  .  Soft tissue swelling 05/29/2017  . Diabetes mellitus without complication ([place]) 40/98/1191  . Acute right eye pain 03/25/2017  . Elevated glucose 02/25/2017  . Bruise 02/25/2017  . Wheezing 02/25/2017  . Paresthesia 02/06/2017  . Falls 01/30/2017  . Colon polyp 01/30/2017  . Right upper quadrant pain 01/12/2017  . Heart disease 12/09/2016  . Plantar fasciitis of left foot 11/07/2016  . Costochondritis 10/06/2016  . BRBPR (bright red blood per rectum) 10/06/2016  . CAD (coronary artery disease) 09/28/2016  . Diarrhea  09/16/2016  . Epigastric discomfort 09/16/2016  . Schizoaffective disorder, depressive type ([place]) 08/28/2016  . Suicide attempt ([place]) 08/27/2016  . Colitis 04/08/2016  . Absolute anemia 01/29/2016  . Neck pain 01/29/2016  . Chronic shoulder pain (Location of Primary Source of Pain) (Left) 01/29/2016  . Osteoarthritis of hip (Bilateral) 01/29/2016  . Lumbar central spinal stenosis (moderate at L4-5; mild at L2-3 and L3-4) 01/29/2016  . Hx of cervical spine surgery 01/29/2016  . Numbness 11/19/2015  . Breast pain 10/19/2015  . Chest pain 10/14/2015  . SOB (shortness of breath) 07/31/2015  . Pleural effusion, bilateral 07/31/2015  . Esophageal spasm 06/05/2015  . History of DVT (deep vein thrombosis) 02/25/2015  . 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 Constipation 12/12/2014  . Coronary vasospasm ([place]) 12/09/2014  . History of pulmonary embolus (PE) 09/19/2014  . Anxiety and depression 04/13/2014  . IBS (irritable bowel syndrome) 04/11/2014  . GERD (gastroesophageal reflux disease) 04/11/2014  . Insomnia 04/11/2014  . Essential hypertension 10/28/2013  . Anemia 10/28/2013  . Hyperlipidemia 10/28/2013    Past Surgical History:  Procedure Laterality Date  . ABDOMINAL HYSTERECTOMY  1985  . ANTERIOR CERVICAL DECOMP/DISCECTOMY FUSION  10/2012   in [place] C5-C7   . APPENDECTOMY  1985  . BACK SURGERY    . BREAST CYST EXCISION Right   . BREAST EXCISIONAL BIOPSY Right YRS AGO   NEG  . CARDIAC CATHETERIZATION   2000's; 2009  . CHOLECYSTECTOMY  2002  . COLONOSCOPY WITH PROPOFOL N/A 12/12/2016   Procedure: COLONOSCOPY WITH PROPOFOL;  Surgeon: [name], MD;  Location: [facility] ENDOSCOPY;  Service: Endoscopy;  Laterality: N/A;  . COLONOSCOPY WITH PROPOFOL N/A 02/17/2017   Procedure: COLONOSCOPY WITH PROPOFOL;  Surgeon: [name], MD;  Location: [facility] ENDOSCOPY;  Service: Gastroenterology;  Laterality: N/A;  . ESOPHAGOGASTRODUODENOSCOPY (EGD) WITH PROPOFOL N/A 02/17/2017   Procedure:  ESOPHAGOGASTRODUODENOSCOPY (EGD) WITH PROPOFOL;  Surgeon: [name], MD;  Location: [facility] ENDOSCOPY;  Service: Gastroenterology;  Laterality: N/A;  . EXCISION/RELEASE BURSA HIP Right 12/1984  . HERNIA REPAIR  1985  . LEFT HEART CATHETERIZATION WITH CORONARY ANGIOGRAM N/A 12/09/2014   Procedure: LEFT HEART CATHETERIZATION WITH CORONARY ANGIOGRAM;  Surgeon: Burnell Blanks, MD;  Location: [facility] CATH LAB;  Service: Cardiovascular;  Laterality: N/A;  . TONSILLECTOMY AND ADENOIDECTOMY  ~ 1968  . TOTAL HIP ARTHROPLASTY Left 04-06-2014  . UMBILICAL HERNIA REPAIR  1985  . VENA CAVA FILTER PLACEMENT  03/2014    Prior to Admission medications   Medication Sig Start Date End Date Taking? Authorizing Provider  acetaminophen (TYLENOL) 500 MG tablet Take 1 tablet (500 mg total) by mouth every 6 (six) hours as needed. 09/29/18   Laban Emperor, PA-C  blood glucose meter kit and supplies KIT Dispense based on patient and insurance preference. Use once daily as directed.  For ICD 10 code E11.9. 09/28/18   [name], MD  carvedilol (COREG) 3.125 MG tablet Take 1 tablet (3.125 mg total) by mouth 2 (two) times daily with  a meal. 03/08/18   Burnell Blanks, MD  clindamycin (CLEOCIN) 150 MG capsule Take 3 capsules (450 mg total) by mouth 3 (three) times daily. 09/28/18   [name], MD  cyclobenzaprine (FLEXERIL) 10 MG tablet TAKE 1 TABLET BY MOUTH THREE TIMES A DAY AS NEEDED FOR MUSCLE SPASMS 09/20/18   [name], MD  diclofenac sodium (VOLTAREN) 1 % GEL Apply 2 g topically 4 (four) times daily. Right arm/shoulder arthritis 04/08/18   McLean-Scocuzza, Nino Glow, MD  dicyclomine (BENTYL) 20 MG tablet TAKE 1 TABLET BY MOUTH 3 TIMES A DAY AS NEEDED FOR SPASMS 09/21/18   [name], MD  diphenhydrAMINE (BENADRYL) 25 MG tablet Take 1 tablet (25 mg total) by mouth every 6 (six) hours. 09/12/16   Jola Schmidt, MD  ELIQUIS 5 MG TABS tablet TAKE 1 TABLET (5 MG TOTAL) BY MOUTH 2 (TWO) TIMES  DAILY. 04/12/18   [name], MD  escitalopram (LEXAPRO) 20 MG tablet Take 1 tablet (20 mg total) by mouth daily. 07/01/18   Ursula Alert, MD  gabapentin (NEURONTIN) 600 MG tablet Take 1-1.5 tablets (600-900 mg total) by mouth 2 (two) times daily. Take 1 tablet in the AM and 1.5 tablet at bedtime 07/01/18   Ursula Alert, MD  isosorbide mononitrate (IMDUR) 30 MG 24 hr tablet TAKE 1.5 TABLETS (45 MG TOTAL) BY MOUTH DAILY. 03/08/18   Burnell Blanks, MD  levothyroxine (SYNTHROID, LEVOTHROID) 25 MCG tablet TAKE 1 TABLET BY MOUTH BEFORE BREAKFAST 2-3 HOURS BEFORE TAKING ANY OTHER MEDICATION OR SUPPLEMENT 08/02/18   [name], MD  lidocaine (LIDODERM) 5 % Place 1 patch onto the skin daily. Remove & Discard patch within 12 hours or as directed by MD 09/29/18   Laban Emperor, PA-C  Multiple Vitamins-Minerals (SUPER THERA VITE M) TABS Take by mouth.    [provider]  nitroGLYCERIN (NITROSTAT) 0.4 MG SL tablet Place 1 tablet (0.4 mg total) under the tongue every 5 (five) minutes as needed for chest pain. 03/08/18 06/06/18  Burnell Blanks, MD  OLANZapine (ZYPREXA) 10 MG tablet Take 1 tablet (10 mg total) by mouth at bedtime. 07/01/18   Ursula Alert, MD  ondansetron (ZOFRAN) 4 MG tablet Take 1 tablet (4 mg total) by mouth 2 (two) times daily as needed for nausea or vomiting. 09/21/18   [name], MD  pantoprazole (PROTONIX) 40 MG tablet TAKE 1 TABLET BY MOUTH EVERY DAY 08/04/18   [name], MD  penicillin v potassium (VEETID) 500 MG tablet Take 1 tablet (500 mg total) by mouth 3 (three) times daily. 08/06/18   Jodelle Green, FNP  pravastatin (PRAVACHOL) 10 MG tablet TAKE 1 TABLET BY MOUTH EVERYDAY AT BEDTIME 09/23/18   [name], MD  PROCARDIA 10 MG capsule TAKE 1 CAPSULE BY MOUTH 2 TIMES DAILY. TAKE 30 MINS BEFORE MEALS UP TO 1X PER DAY 03/03/18   [name], MD  ROZEREM 8 MG tablet TAKE 1 TABLET (8 MG TOTAL) BY MOUTH AT BEDTIME. 09/08/18    Eappen, Ria Clock, MD  sitaGLIPtin (JANUVIA) 100 MG tablet Take 1 tablet (100 mg total) by mouth daily. 09/04/18   [name], MD  traMADol (ULTRAM) 50 MG tablet Take 1 tablet (50 mg total) by mouth every 8 (eight) hours as needed for moderate pain. 09/08/18   [name], MD    Allergies Abilify [aripiprazole]; Augmentin [amoxicillin-pot clavulanate]; Bactrim [sulfamethoxazole-trimethoprim]; Ceftin [cefuroxime axetil]; Coconut oil; Diclofenac; Dye fdc red [red dye]; Indocin [  indomethacin]; Linaclotide; Lisinopril; Morphine; Morphine and related; Simvastatin; Sulfa antibiotics; Sulfamethoxazole-trimethoprim; Wellbutrin [bupropion]; Zetia [ezetimibe]; Oxycodone-acetaminophen; Quetiapine fumarate; and Quetiapine fumarate  Family History  Problem Relation Age of Onset  . Stroke Mother        Deceased  . Lung cancer Father        Deceased  . Healthy Daughter   . Healthy Son        x 2  . Other Son        Suicide  . Cancer Brother   . Heart attack Neg Hx     Social History Social History   Tobacco Use  . Smoking status: Former Smoker    Packs/day: 1.50    Years: 10.00    Pack years: 15.00    Types: Cigarettes    Last attempt to quit: 04/12/1979    Years since quitting: 39.4  . Smokeless tobacco: Never Used  Substance Use Topics  . Alcohol use: No    Alcohol/week: 0.0 standard drinks  . Drug use: No     Review of Systems  Constitutional: No fever/chills Cardiovascular: No chest pain. Respiratory: No cough. No SOB. Gastrointestinal: No abdominal pain.  No nausea, no vomiting.  Musculoskeletal: Positive for back and hip pain. Skin: Negative for rash, abrasions, lacerations, ecchymosis. Neurological: Negative for headaches, numbness or tingling   ____________________________________________   PHYSICAL EXAM:  VITAL SIGNS: ED Triage Vitals  Enc Vitals Group     BP 09/29/18 1218 104/63     Pulse Rate 09/29/18 1218 84     Resp 09/29/18 1218 17     Temp  09/29/18 1218 98.3 F (36.8 C)     Temp Source 09/29/18 1218 Oral     SpO2 09/29/18 1218 95 %     Weight 09/29/18 1219 219 lb (99.3 kg)     Height 09/29/18 1219 '5\' 6"'$  (1.676 m)     Head Circumference --      Peak Flow --      Pain Score 09/29/18 1218 10     Pain Loc --      Pain Edu? --      Excl. in [place]? --      Constitutional: Alert and oriented. Well appearing and in no acute distress. Eyes: Conjunctivae are normal. PERRL. EOMI. Head: Atraumatic. ENT:      Ears:      Nose: No congestion/rhinnorhea.      Mouth/Throat: Mucous membranes are moist.  Neck: No stridor.   Cardiovascular: Normal rate, regular rhythm.  Good peripheral circulation. Respiratory: Normal respiratory effort without tachypnea or retractions. Lungs CTAB. Good air entry to the bases with no decreased or absent breath sounds. Gastrointestinal: Bowel sounds 4 quadrants. Soft and nontender to palpation. No guarding or rigidity. No palpable masses. No distention. Musculoskeletal: Full range of motion to all extremities. No gross deformities appreciated.  Pain to right hip with ambulation.  Full range of motion of right hip.  Pain with internal rotation of right hip.  Tenderness to palpation to right lumbar paraspinal muscles and right trochanteric bursa.  Neurologic:  Normal speech and language. No gross focal neurologic deficits are appreciated.  Skin:  Skin is warm, dry and intact. No rash noted. Psychiatric: Mood and affect are normal. Speech and behavior are normal. Patient exhibits appropriate insight and judgement.   ____________________________________________   LABS (all labs ordered are listed, but only abnormal results are displayed)  Labs Reviewed  URINALYSIS, COMPLETE (UACMP) WITH MICROSCOPIC - Abnormal; Notable for the following  components:      Result Value   Color, Urine YELLOW (*)    APPearance HAZY (*)    Glucose, UA 50 (*)    Bacteria, UA FEW (*)    All other components within normal  limits  URINE CULTURE   ____________________________________________  EKG   ____________________________________________  RADIOLOGY Robinette Haines, personally viewed and evaluated these images (plain radiographs) as part of my medical decision making, as well as reviewing the written report by the radiologist.  Ct Renal Stone Study  Result Date: 09/29/2018 CLINICAL DATA:  Acute right flank pain for 3 days. Remote cholecystectomy and hysterectomy EXAM: CT ABDOMEN AND PELVIS WITHOUT CONTRAST TECHNIQUE: Multidetector CT imaging of the abdomen and pelvis was performed following the standard protocol without IV contrast. COMPARISON:  09/30/2016 FINDINGS: Lower chest: Trace pleural effusions and associated minor basilar atelectasis. Normal heart size. No pericardial effusion. Hepatobiliary: No focal liver abnormality is seen. Status post cholecystectomy. No biliary dilatation. Pancreas: Unremarkable. No pancreatic ductal dilatation or surrounding inflammatory changes. Spleen: Normal in size without focal abnormality. Adrenals/Urinary Tract: Normal adrenal glands. No renal obstruction, hydronephrosis, hydroureter or definite obstructing ureteral calculus. Limited assessment the bladder because of artifact from the left hip hardware. Bilateral ovarian vein calcified phleboliths noted as before. These calcifications are adjacent to the nondilated ureters bilaterally. Stomach/Bowel: Negative for bowel obstruction, significant dilatation, ileus, or free air. Appendix not visualized. No acute inflammatory process in the right upper quadrant. No fluid collection, abscess, hemorrhage, hematoma, or ascites. Moderate colonic stool burden throughout the transverse colon. Vascular/Lymphatic: Aorta atherosclerotic. Negative for aneurysm. IVC filter in the infrarenal IVC. No bulky adenopathy. Reproductive: Remote hysterectomy. Small bilateral adnexal likely ovarian cysts as before measuring 2.4 cm on the right and 2.2  cm on the left. No pelvic ascites. Other: No abdominal wall hernia or abnormality. No abdominopelvic ascites. Musculoskeletal: Left hip replacement creates artifact through the pelvis. Degenerative changes of the spine. Lower lumbar facet arthropathy. No acute osseous finding. IMPRESSION: No acute obstructing urinary tract or ureteral calculus. No acute obstructive uropathy or hydronephrosis on either side. Trace pleural effusions and minimal basilar atelectasis Remote cholecystectomy and hysterectomy Abdominal aortic atherosclerosis without aneurysm Small bilateral ovarian cysts, unchanged compared to 09/30/2016. Electronically Signed   By: Jerilynn Mages.  Shick M.D.   On: 09/29/2018 16:53    ____________________________________________    PROCEDURES  Procedure(s) performed:    Procedures    Medications  acetaminophen (TYLENOL) tablet 650 mg (650 mg Oral Given 09/29/18 1718)     ____________________________________________   INITIAL IMPRESSION / ASSESSMENT AND PLAN / ED COURSE  Pertinent labs & imaging results that were available during my care of the patient were reviewed by me and considered in my medical decision making (see chart for details).  Review of the [place] CSRS was performed in accordance of the [place] prior to dispensing any controlled drugs.   Patient presented to emergency department for evaluation of low back pain and right hip pain for 3 days.  Vital signs and exam are reassuring.  Pain is intermittent.  Pain was reproducible with range of motion of hip.  Patient denies any pain at discharge.  Lumbar x-ray and hip x-ray are consistent with osteoarthritis.  CT is negative for acute findings and findings were discussed with patient.  Urinalysis shows white blood cells, few bacteria, Ca crystals.  Patient denies any urinary symptoms.  Her urine will be sent for culture.  Patient will be discharged home with prescriptions for Tylenol. Patient is to  follow up with primary care as directed.  Patient is given ED precautions to return to the ED for any worsening or new symptoms.     ____________________________________________  FINAL CLINICAL IMPRESSION(S) / ED DIAGNOSES  Final diagnoses:  Osteoarthritis of right hip, unspecified osteoarthritis type      NEW MEDICATIONS STARTED DURING THIS VISIT:  ED Discharge Orders         Ordered    acetaminophen (TYLENOL) 500 MG tablet  Every 6 hours PRN     09/29/18 1703    lidocaine (LIDODERM) 5 %  Every 24 hours     09/29/18 1703              This chart was dictated using voice recognition software/Dragon. Despite best efforts to proofread, errors can occur which can change the meaning. Any change was purely unintentional.    Laban Emperor, PA-C 09/30/18 1622    Earleen Newport, MD 10/02/18 [phone]

## 2018-09-29 NOTE — ED Triage Notes (Signed)
Pt c/o right lower back and hip pain for the past 3 days after doing a lot of sweeping.

## 2018-09-29 NOTE — ED Notes (Signed)
Right back and hip pain that radiates to knee. Pt able to ambulate. Pain 10/10 and is intermittent. Denies changes in bowel and bladder.

## 2018-09-30 ENCOUNTER — Telehealth: Payer: Self-pay | Admitting: Family Medicine

## 2018-09-30 ENCOUNTER — Ambulatory Visit
Admission: RE | Admit: 2018-09-30 | Discharge: 2018-09-30 | Disposition: A | Discharge status: Home / Self Care | Payer: Medicare HMO | Source: Ambulatory Visit | Attending: Family Medicine | Admitting: Family Medicine

## 2018-09-30 LAB — MICROALBUMIN / CREATININE URINE RATIO
Creatinine,U: 52.3 mg/dL
Microalb Creat Ratio: 1.3 mg/g (ref 0.0–30.0)
Microalb, Ur: <0.7 mg/dL (ref 0.0–1.9)

## 2018-09-30 NOTE — Addendum Note (Signed)
Addended by: Leeanne Rio on: 09/30/2018 01:27 PM   Modules accepted: Orders

## 2018-09-30 NOTE — Telephone Encounter (Signed)
I ordered an orthopantogram. She may have to go to [place] unless Lenna Sciara can find somewhere in [place] to complete this.

## 2018-09-30 NOTE — Telephone Encounter (Addendum)
The patient stated that the xray that she needed of her face could not be done at [facility] . She wanted to know if there is somewhere in [place] that the xray could be performed. However , if there is not a place in [place] , the tech informed her that it could be done in [place]. The patient also needs a follow up within 2 days from her ED visit on 1.29.2020 for her hip.

## 2018-10-01 ENCOUNTER — Ambulatory Visit (HOSPITAL_COMMUNITY)
Admission: RE | Admit: 2018-10-01 | Discharge: 2018-10-01 | Disposition: A | Discharge status: Home / Self Care | Payer: Medicare HMO | Source: Ambulatory Visit | Attending: Family Medicine | Admitting: Family Medicine

## 2018-10-01 DIAGNOSIS — K047 Periapical abscess without sinus: Secondary | ICD-10-CM | POA: Diagnosis not present

## 2018-10-01 LAB — URINE CULTURE: Culture: 30000 — AB

## 2018-10-01 NOTE — Telephone Encounter (Signed)
Verified [facility] can do this type of xray. lvm for pt to rtc.

## 2018-10-01 NOTE — Telephone Encounter (Signed)
Left message to return call to office.

## 2018-10-01 NOTE — Telephone Encounter (Signed)
Pt returned call. Relayed message in chart to pt. Pt asked about ED follow up with PCP for her hip. Advised pt that I would have someone to follow up with her for scheduling, Im not showing any openings with PCP within 2 days of ED visit. Assisted pt with getting to [facility] to schedule X-Ray.   Please Assist.

## 2018-10-01 NOTE — Telephone Encounter (Signed)
HFU from ED scheduled.

## 2018-10-04 ENCOUNTER — Encounter: Payer: Self-pay | Admitting: Family Medicine

## 2018-10-04 IMAGING — CR DG CHEST 2V
1 series · 2 of 2 positions shown · non-contrast
Comparison: 09/27/2016

CLINICAL DATA: Chest pain, lightheadedness, nausea, and weakness.

EXAM:
CHEST  2 VIEW

[Series 1: w chest pa · 0.14mm/px · 2 of 2 slices shown]
[im 1/2]
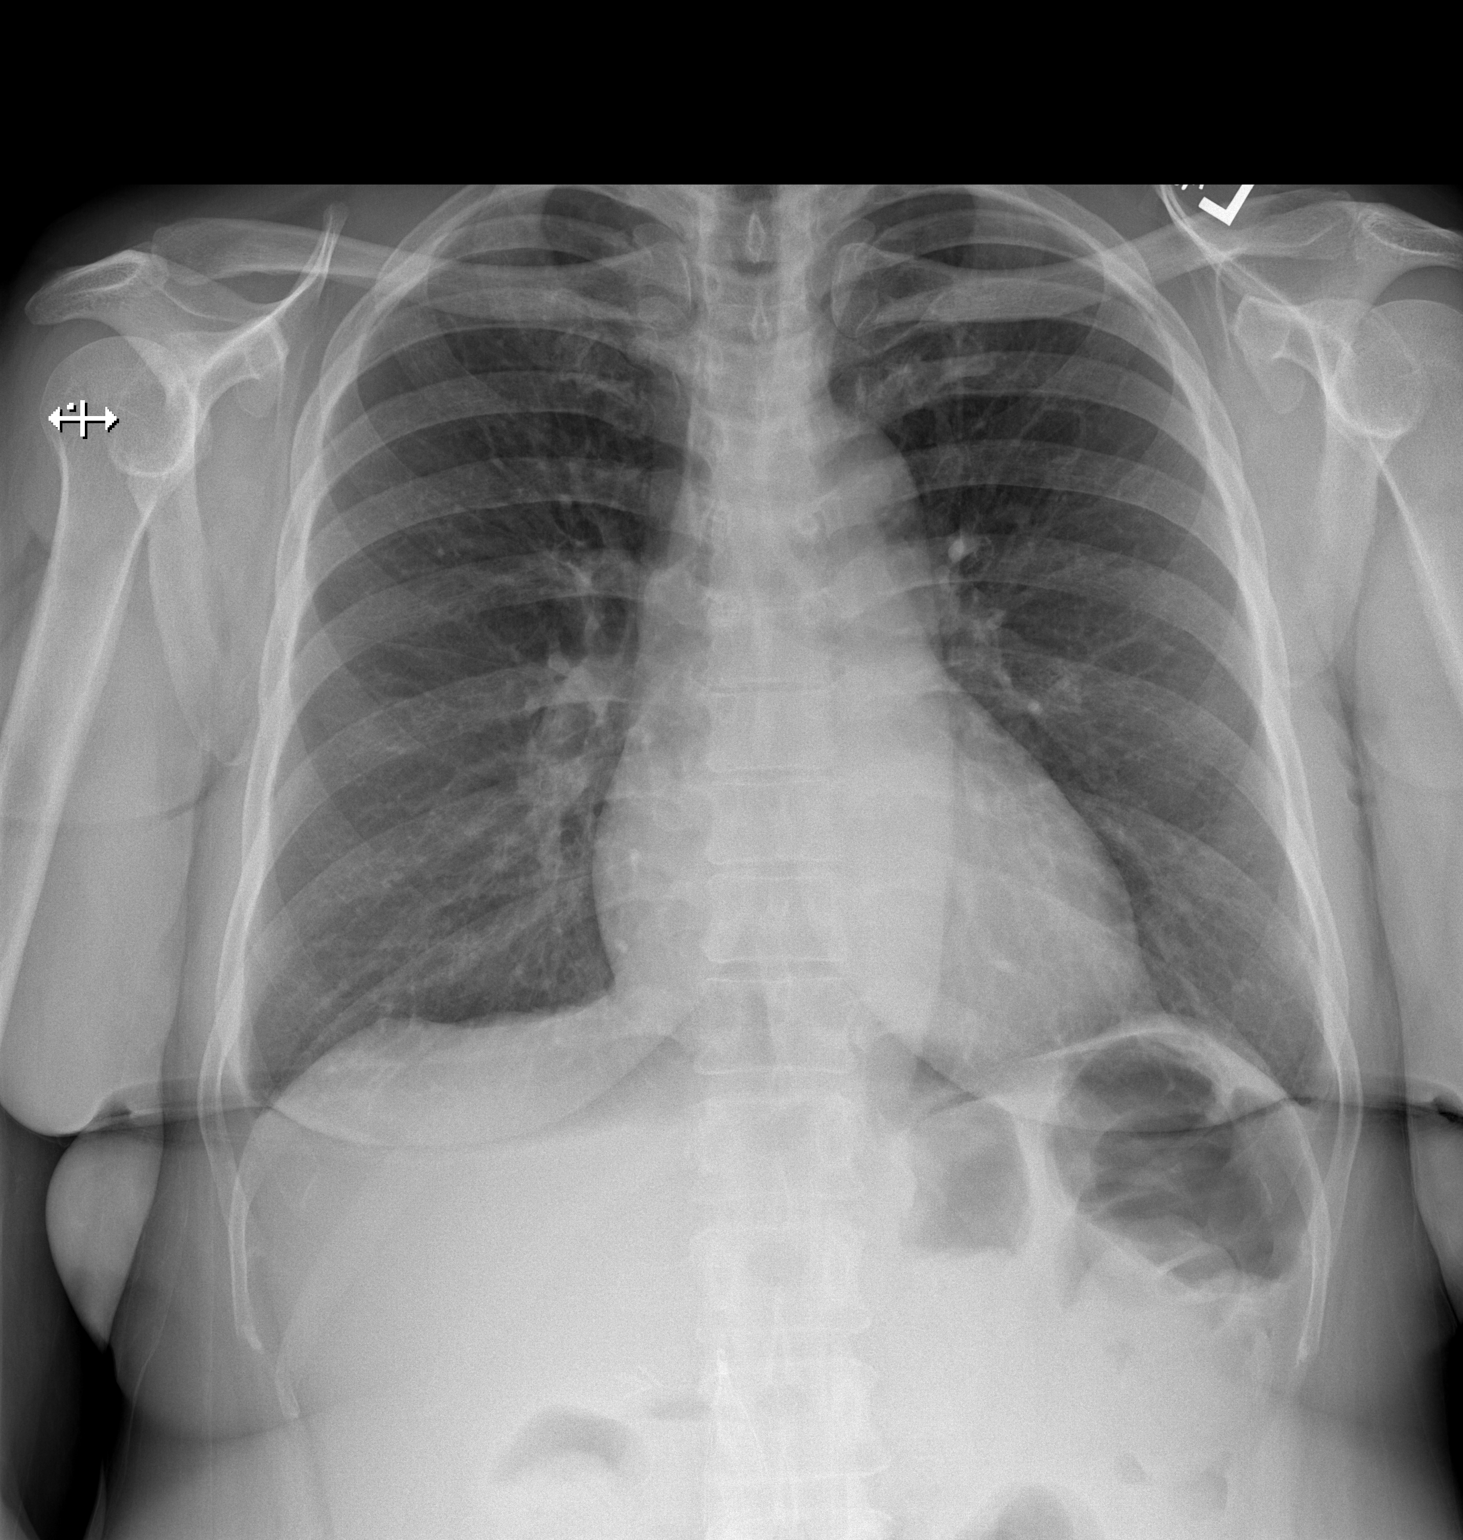
[im 2/2]
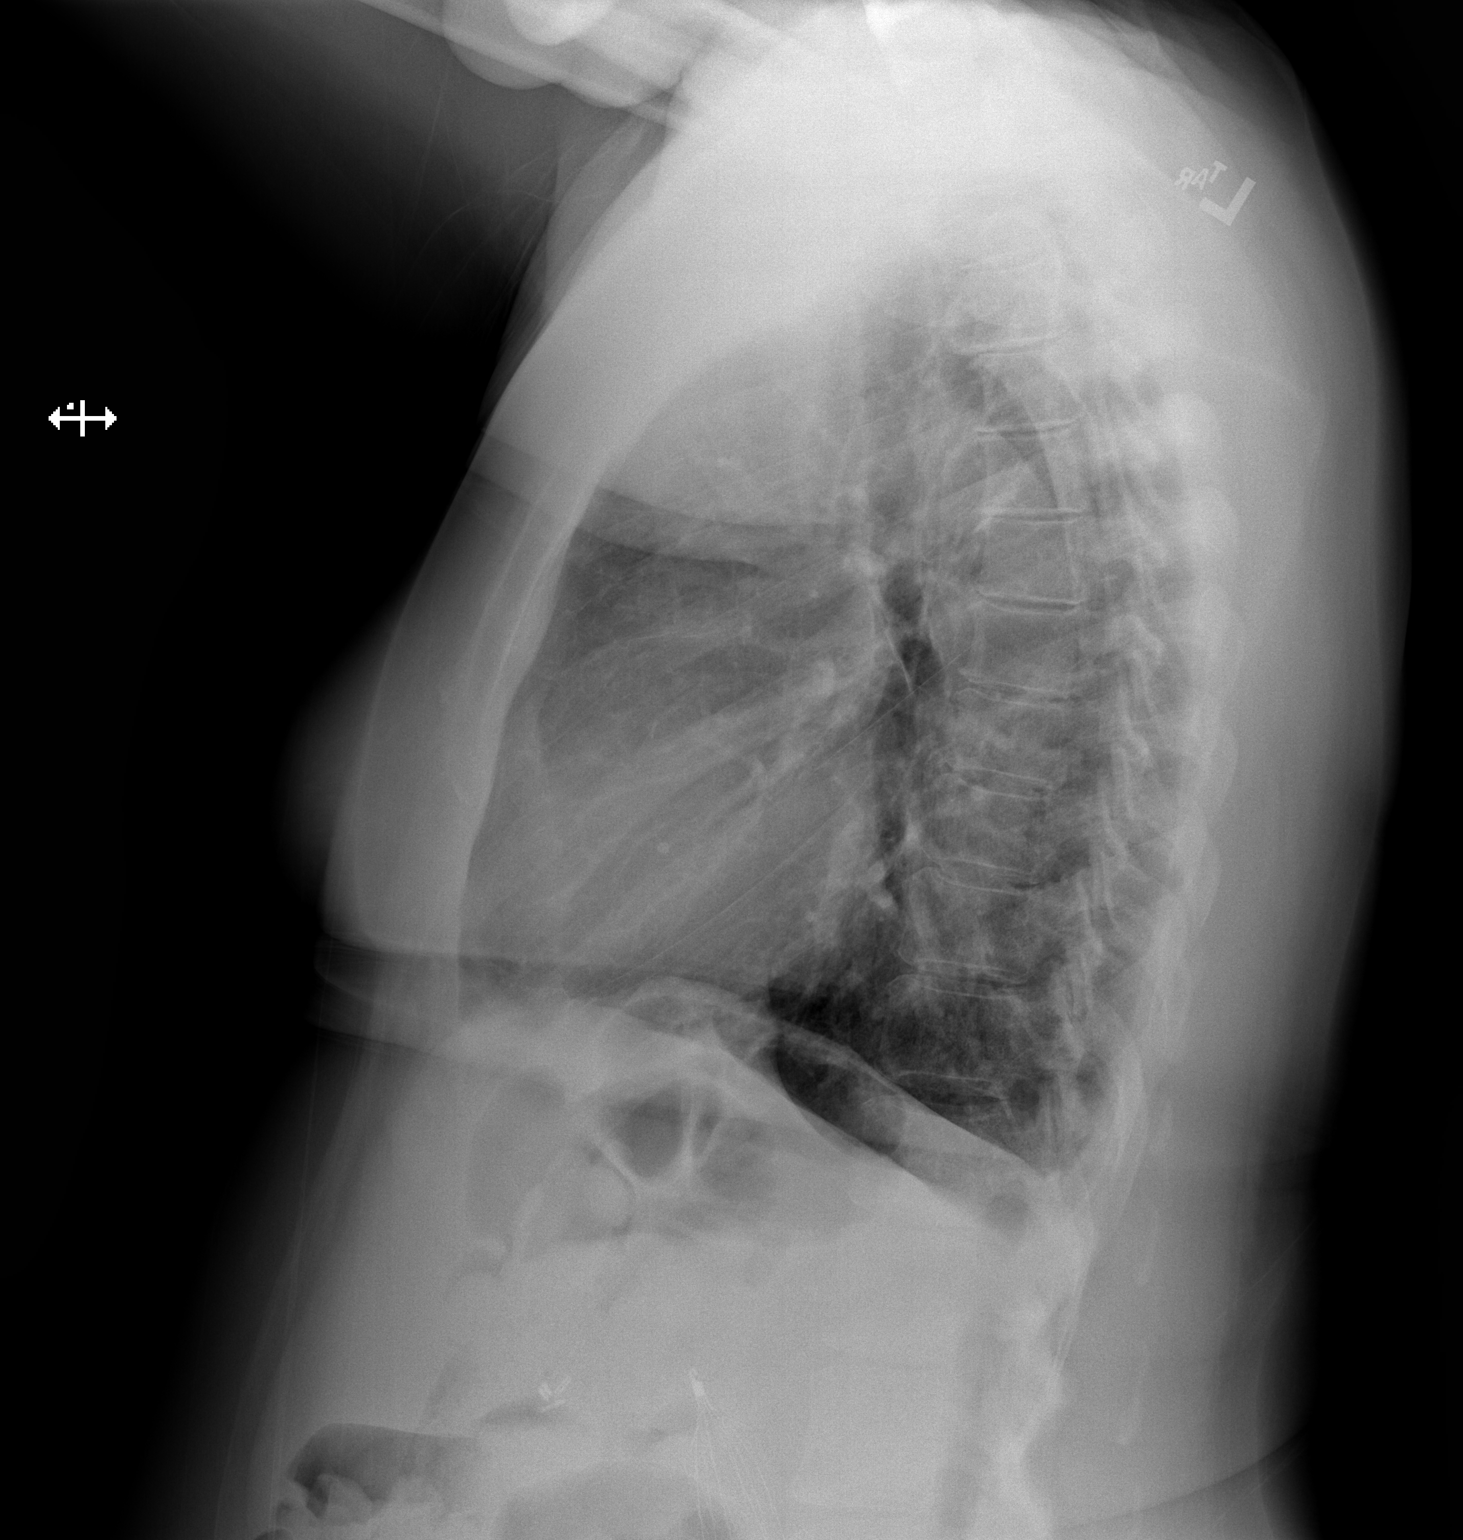

[2 of 2 positions shown; findings below may reference images not displayed]

FINDINGS: The cardiomediastinal silhouette is within normal limits. The lungs
are well inflated and clear. There is no evidence of pleural
effusion or pneumothorax. Prior cervical spine fusion. Surgical
clips and an IVC filter are noted in the upper abdomen.
IMPRESSION: No active cardiopulmonary disease.

## 2018-10-06 ENCOUNTER — Other Ambulatory Visit: Payer: Self-pay | Admitting: Family Medicine

## 2018-10-06 ENCOUNTER — Ambulatory Visit (INDEPENDENT_AMBULATORY_CARE_PROVIDER_SITE_OTHER): Payer: Medicare HMO | Admitting: Family Medicine

## 2018-10-06 ENCOUNTER — Encounter: Payer: Self-pay | Admitting: Family Medicine

## 2018-10-06 ENCOUNTER — Encounter: Payer: Medicare HMO | Admitting: Obstetrics and Gynecology

## 2018-10-06 VITALS — BP 115/75 | HR 73 | Temp 98.2°F | Ht 66.0 in | Wt 223.6 lb

## 2018-10-06 DIAGNOSIS — K047 Periapical abscess without sinus: Secondary | ICD-10-CM

## 2018-10-06 DIAGNOSIS — N83209 Unspecified ovarian cyst, unspecified side: Secondary | ICD-10-CM

## 2018-10-06 DIAGNOSIS — M25551 Pain in right hip: Secondary | ICD-10-CM

## 2018-10-06 DIAGNOSIS — M62838 Other muscle spasm: Secondary | ICD-10-CM

## 2018-10-06 DIAGNOSIS — K5900 Constipation, unspecified: Secondary | ICD-10-CM | POA: Diagnosis not present

## 2018-10-06 DIAGNOSIS — M79604 Pain in right leg: Secondary | ICD-10-CM | POA: Insufficient documentation

## 2018-10-06 MED ORDER — LIDOCAINE 5 % EX PTCH: 1.0000 | MEDICATED_PATCH | CUTANEOUS | 0 refills | Status: DC

## 2018-10-06 NOTE — Progress Notes (Signed)
[name], MD Phone: [phone]  Valerie Vazquez is a 63 y.o. female who presents today for f/u.  CC: Hip pain, constipation, dental infection, ovarian cysts  Patient went to the ED for hip pain.  She developed sudden pain in her right hip and buttocks when she was sweeping the floor.  No history of this kind of pain.  No specific injury.  Movement would make it worse.  She underwent evaluation which revealed right hip OA.  They placed a lidocaine patch on her and that was beneficial.  She has since removed this.  She does have a history of left hip replacement.  She underwent CT scan to evaluate for renal stones though none were seen.  She underwent lumbar spine film that revealed degenerative changes though no acute abnormalities.  Her CT scan revealed ovarian cysts.  She is going to see gynecology on 10/12/2018 for this.  There was some constipation noted.  She does not want to take MiraLAX.  She notes her dental infection is improving.  There is less pain and less swelling.  No fevers.  She continues to take clindamycin.  Social History   Tobacco Use  Smoking Status Former Smoker  . Packs/day: 1.50  . Years: 10.00  . Pack years: 15.00  . Types: Cigarettes  . Last attempt to quit: 04/12/1979  . Years since quitting: 39.5  Smokeless Tobacco Never Used     ROS see history of present illness  Objective  Physical Exam Vitals:   10/06/18 1130  BP: 115/75  Pulse: 73  Temp: 98.2 F (36.8 C)  SpO2: 95%    BP Readings from Last 3 Encounters:  10/06/18 115/75  09/29/18 126/69  03/15/18 (!) 154/95   Wt Readings from Last 3 Encounters:  10/06/18 223 lb 9.6 oz (101.4 kg)  09/29/18 219 lb (99.3 kg)  03/15/18 200 lb (90.7 kg)    Physical Exam Constitutional:      General: She is not in acute distress.    Appearance: She is not diaphoretic.  HENT:     Head:     Comments: No exterior maxillary tenderness bilaterally, no maxillary swelling Cardiovascular:     Rate  and Rhythm: Normal rate and regular rhythm.     Heart sounds: Normal heart sounds.  Pulmonary:     Effort: Pulmonary effort is normal.     Breath sounds: Normal breath sounds.  Musculoskeletal:     Comments: Full range internal and external range of motion bilateral hips with no discomfort, no tenderness of her posterior or lateral hip on the right  Skin:    General: Skin is warm and dry.  Neurological:     Mental Status: She is alert.      Assessment/Plan: Please see individual problem list.  Right hip pain I suspect this is related to osteoarthritis though could have been muscular strain.  We will refer to orthopedic surgery.  Lidocaine patches prescribed.  We will await the prior authorization.  Ovarian cyst She will keep her appointment with gynecology.  Dental infection Improving.  Orthopantogram with no abscesses.  She will finish her course of clindamycin.  She will see the dentist as planned.  Constipation Discussed MiraLAX that she declined.  She will add a fiber supplement or increase dietary fiber.  She will monitor.   Orders Placed This Encounter  Procedures  . Ambulatory referral to Orthopedic Surgery    Referral Priority:   Routine    Referral Type:   Surgical  Referral Reason:   Specialty Services Required    Requested Specialty:   Orthopedic Surgery    Number of Visits Requested:   1    Meds ordered this encounter  Medications  . lidocaine (LIDODERM) 5 %    Sig: Place 1 patch onto the skin daily. Remove & Discard patch within 12 hours.    Dispense:  30 patch    Refill:  0     [name], MD [place]

## 2018-10-06 NOTE — Assessment & Plan Note (Signed)
She will keep her appointment with gynecology.

## 2018-10-06 NOTE — Patient Instructions (Signed)
Nice to see you. We will try to get you to see orthopedics for your hip.  I sent lidocaine patches to your pharmacy. If you have recurrence of pain please be reevaluated in the office or elsewhere. Please complete your course of clindamycin.  Please see the dentist as planned. Please see the gynecologist as planned. Please try adding fiber supplementation to your diet to help with your constipation.

## 2018-10-06 NOTE — Assessment & Plan Note (Signed)
Improving.  Orthopantogram with no abscesses.  She will finish her course of clindamycin.  She will see the dentist as planned.

## 2018-10-06 NOTE — Assessment & Plan Note (Signed)
Discussed MiraLAX that she declined.  She will add a fiber supplement or increase dietary fiber.  She will monitor.

## 2018-10-06 NOTE — Assessment & Plan Note (Addendum)
I suspect this is related to osteoarthritis though could have been muscular strain.  We will refer to orthopedic surgery.  Lidocaine patches prescribed.  We will await the prior authorization.

## 2018-10-08 ENCOUNTER — Other Ambulatory Visit: Payer: Self-pay | Admitting: Family Medicine

## 2018-10-08 DIAGNOSIS — Z760 Encounter for issue of repeat prescription: Secondary | ICD-10-CM

## 2018-10-11 ENCOUNTER — Other Ambulatory Visit: Payer: Self-pay | Admitting: Family Medicine

## 2018-10-11 NOTE — Telephone Encounter (Signed)
Last OV 10/06/2018  Last refilled 04/12/2018 disp 60 with 5 refills   Sent to PCP for approval

## 2018-10-12 ENCOUNTER — Telehealth: Payer: Self-pay

## 2018-10-12 ENCOUNTER — Encounter: Payer: Medicare HMO | Admitting: Obstetrics and Gynecology

## 2018-10-12 NOTE — Telephone Encounter (Signed)
Valerie Vazquez Key: [id number] - PA Case ID: [id number] - Rx #: [id number]    PA has been done waiting on approval or denial

## 2018-10-13 NOTE — Telephone Encounter (Signed)
Pt has been approved for the lidicane patches from 08/30/2018 - 09/01/2019.    Fax from [employer] back # [phone]   Pt advised that she has been approved.

## 2018-10-14 ENCOUNTER — Telehealth: Payer: Self-pay

## 2018-10-14 NOTE — Telephone Encounter (Signed)
Valerie Vazquez [name] - PA Case ID: [id number] - Rx #: [id number]   PA has been done waiting on determination

## 2018-10-27 ENCOUNTER — Ambulatory Visit: Payer: Self-pay | Admitting: Gastroenterology

## 2018-10-28 ENCOUNTER — Telehealth: Payer: Self-pay | Admitting: Family Medicine

## 2018-10-28 DIAGNOSIS — K034 Hypercementosis: Secondary | ICD-10-CM

## 2018-10-28 NOTE — Telephone Encounter (Signed)
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  If she would be willing to do these tests I can place orders.  The lab test would need to be a early morning test.  Thanks.       "Dr. Caryl Bis-  Dr. Gabriel Carina at [facility] declined the referral stating:  "Rejection Reason - Other - Dr. Gabriel Carina states that this condition can be associated with acromegaly or Paget's. Dr. Gabriel Carina recommends morning IGF 1 to test for acromegaly and consideration of whole body bone scan if clinical concern for Paget's."  [facility] said 11 days ago    Thanks,  [employer]"

## 2018-10-29 NOTE — Telephone Encounter (Signed)
LAB526 ([facility])-Insulin-Like Growth Factor

## 2018-10-29 NOTE — Telephone Encounter (Signed)
Pt staes you can put those orders in, that's fine and the pt wanted you to know that she went to the dentist on Monday and they found something on her tongue and is sending her to oral health on November 02, 2018 just a FYI.  Nina,CMA

## 2018-10-29 NOTE — Telephone Encounter (Signed)
Noted. I am going to send this to one of the lab staff to see if they can confirm that the insulin like growth factor that is in the system is the same order as an IGF1. Once they confirm this I can place the order.

## 2018-10-31 NOTE — Telephone Encounter (Signed)
Lab test ordered.  I am unable to find a diagnosis code to attached to the bone scan.  I will include Aron Baba on this message to see if she can look at codes to get a nuclear medicine whole-body bone scan approved.  The patient has hypercementosis and it was recommended by endocrinology to consider a bone scan to rule out Paget's disease.  Please get the patient scheduled for lab work.  Thanks.

## 2018-10-31 NOTE — Addendum Note (Signed)
Addended by: Caryl Bis Yaneisy Wenz G on: 10/31/2018 12:43 PM   Modules accepted: Orders

## 2018-11-01 NOTE — Telephone Encounter (Signed)
Hi Dr. Caryl Bis,   I was not able to find any policy with diagnosis for the complete body bone scan.  My recommendation would be to have someone call insurance and see if they will give prior auth for the hypercementosis or if there are any other symptoms of the Paget's disease, not sure if the ovarian cysts would work??  Sorry not able to help more.  Dawn

## 2018-11-02 ENCOUNTER — Other Ambulatory Visit: Payer: Self-pay | Admitting: Family Medicine

## 2018-11-02 DIAGNOSIS — M62838 Other muscle spasm: Secondary | ICD-10-CM

## 2018-11-02 NOTE — Telephone Encounter (Signed)
Last OV 10/06/2018  Rx not listed on pt's medication list

## 2018-11-03 DIAGNOSIS — R682 Dry mouth, unspecified: Secondary | ICD-10-CM | POA: Diagnosis not present

## 2018-11-03 DIAGNOSIS — K029 Dental caries, unspecified: Secondary | ICD-10-CM | POA: Diagnosis not present

## 2018-11-03 DIAGNOSIS — K1321 Leukoplakia of oral mucosa, including tongue: Secondary | ICD-10-CM | POA: Diagnosis not present

## 2018-11-03 NOTE — Telephone Encounter (Signed)
Valerie Vazquez how should I go about this should I call insurance?

## 2018-11-03 NOTE — Telephone Encounter (Signed)
Pt returned call to [place]. Reviewed medication list and it shows cyclobenzaprine (FLEXERIL) 10 MG tablet was prescribed by Philis Nettle on 2/620 for muscle spasms. Pt requests call back.

## 2018-11-03 NOTE — Telephone Encounter (Signed)
called pt and left a VM to call back regarding refill. Was this Rx'd by another doctor? What is she taking it for?

## 2018-11-03 NOTE — Telephone Encounter (Signed)
Sent to [facility] for approval.

## 2018-11-05 NOTE — Telephone Encounter (Signed)
Sent to PCP ?

## 2018-11-05 NOTE — Telephone Encounter (Addendum)
First we need to let Dr. Caryl Bis see Dawns recommendation.

## 2018-11-07 NOTE — Telephone Encounter (Signed)
Dawn, Thank you for looking in to this. We will touch base with her insurance and see where to go from there.   Wilburn Cornelia, can you call her insurance to see if the whole body bone scan would be approved for hypercementosis?

## 2018-11-10 NOTE — Telephone Encounter (Signed)
Called pt's insurance and was put on hold for a long period of time. Dr. Caryl Bis stated that it might be best to let Melissa handle this due to being time consuming. Sent to [facility].

## 2018-11-12 DIAGNOSIS — K034 Hypercementosis: Secondary | ICD-10-CM | POA: Diagnosis not present

## 2018-11-15 DIAGNOSIS — K034 Hypercementosis: Secondary | ICD-10-CM | POA: Diagnosis not present

## 2018-11-17 ENCOUNTER — Other Ambulatory Visit: Payer: Self-pay | Admitting: Family Medicine

## 2018-11-17 DIAGNOSIS — M62838 Other muscle spasm: Secondary | ICD-10-CM

## 2018-11-17 NOTE — Telephone Encounter (Signed)
Last OV 10/06/2018  Last refilled 11/03/2018 disp 30 with no refills   Next OV 11/29/2018  Sent to PCP to advise

## 2018-11-22 ENCOUNTER — Other Ambulatory Visit: Payer: Self-pay | Admitting: Family Medicine

## 2018-11-22 DIAGNOSIS — K224 Dyskinesia of esophagus: Secondary | ICD-10-CM

## 2018-11-25 ENCOUNTER — Encounter: Payer: Self-pay | Admitting: Family Medicine

## 2018-11-25 ENCOUNTER — Telehealth: Payer: Self-pay

## 2018-11-25 NOTE — Telephone Encounter (Signed)
Copied from [place] [id number]. Topic: Appointment Scheduling - Scheduling Inquiry for Clinic >> Nov 25, 2018 10:44 AM Reyne Dumas L wrote: Reason for CRM:   Pt called and left voicemail on [facility] mailbox 11/24/2018.  She is wanting to know if her appointment for 11/29/2018 is still on.  She can be reached at [phone].

## 2018-11-25 NOTE — Telephone Encounter (Signed)
[employer] plus plan has provided pt with temporary supply of Nifedipine cap 10 MG.   Drug is NOT included on [employer]'s list of covered medication

## 2018-11-25 NOTE — Telephone Encounter (Signed)
Copied from [place] [phone]. Topic: General - Other >> Nov 25, 2018  2:56 PM Ivar Drape wrote: Reason for CRM:  Patient is willing to do WEB Ex appt for her Mon 11/29/2018 appt at 2:15pm.  Email and telephone number has been verified.

## 2018-11-26 NOTE — Telephone Encounter (Signed)
Called and spoke with pt. Pt advised that she should keep her appt and it will be a virtual visit. Pt advised and voiced understanding.

## 2018-11-26 NOTE — Telephone Encounter (Signed)
Called pt and advised appt is still on will be a virtual visit pt confirmed email [email or web address]

## 2018-11-29 ENCOUNTER — Other Ambulatory Visit: Payer: Self-pay

## 2018-11-29 ENCOUNTER — Encounter: Payer: Self-pay | Admitting: Family Medicine

## 2018-11-29 ENCOUNTER — Telehealth: Payer: Self-pay | Admitting: Family Medicine

## 2018-11-29 ENCOUNTER — Ambulatory Visit (INDEPENDENT_AMBULATORY_CARE_PROVIDER_SITE_OTHER): Payer: Medicare HMO | Admitting: Family Medicine

## 2018-11-29 DIAGNOSIS — E119 Type 2 diabetes mellitus without complications: Secondary | ICD-10-CM | POA: Diagnosis not present

## 2018-11-29 DIAGNOSIS — K034 Hypercementosis: Secondary | ICD-10-CM | POA: Diagnosis not present

## 2018-11-29 DIAGNOSIS — R110 Nausea: Secondary | ICD-10-CM | POA: Diagnosis not present

## 2018-11-29 DIAGNOSIS — K582 Mixed irritable bowel syndrome: Secondary | ICD-10-CM | POA: Diagnosis not present

## 2018-11-29 MED ORDER — ONDANSETRON HCL 4 MG PO TABS: 4.0000 mg | ORAL_TABLET | Freq: Two times a day (BID) | ORAL | 0 refills | Status: DC | PRN

## 2018-11-29 NOTE — Assessment & Plan Note (Signed)
Continue Januvia.  Plan to check A1c he had a visit in the office in 3 months.

## 2018-11-29 NOTE — Telephone Encounter (Signed)
Please call the patient and get her set up for 42-month follow-up in the office.  Thanks.

## 2018-11-29 NOTE — Progress Notes (Signed)
Virtual Visit via Video Note  I connected with Valerie Vazquez on 11/29/18 at  2:15 PM EDT by a video enabled telemedicine application and verified that I am speaking with the correct person using two identifiers. Location patient: neighbor's house Location provider: work Persons participating in the virtual visit: patient, provider.  I discussed the limitations of evaluation and management by telemedicine and the availability of in person appointments. The patient expressed understanding and agreed to proceed.  Reason for visit: 66-monthfollow-up.  HPI: Chronic constipation: Patient notes this is been a chronic issue for years.  She uses an enema every 3 to 4 days.  States there are fleets enemas or equate.  She does note nausea once a day.  She notes lots of spasms in her GI system.  She takes dicyclomine and the Zofran to help with this.  She notes occasional epigastric discomfort that occurs every few days.  She notes no vomiting.  Occasional diarrhea though has more issues with constipation.  No blood in her stool.  She has seen GI previously notes she was advised she had IBS.  She notes these issues have not seemed to change for many years.  Diabetes: Not checking sugars.  Taking Januvia.  No polyuria or polydipsia.  No hypoglycemia.  Hypercementosis/dental issues: She has not been able to see the dentist as they closed related to the coronavirus issues.  She did see endocrinology and they checked lab work to evaluate for acromegaly and Paget's.  This testing appeared to be negative.  She notes they advised her no follow-up is necessary.  She has had no recent dental issues.   ROS: See pertinent positives and negatives per HPI.  Past Medical History:  Diagnosis Date  . Acute right-sided low back pain with right-sided sciatica 10/23/2017  . Anemia   . Bilateral renal cysts   . CAD (coronary artery disease) 09/28/2016  . Chest pain 10/14/2015  . Chronic back pain    "upper and lower"  (09/19/2014)  . Chronic shoulder pain (Location of Primary Source of Pain) (Left) 01/29/2016  . Colitis 04/08/2016  . Coronary vasospasm ([place] 12/09/2014  . Depression    "major" (09/19/2014)  . DVT (deep venous thrombosis) ([place]   . Esophageal spasm   . Essential hypertension 10/28/2013  . Gallstones   . Gastritis   . Gastroenteritis   . GERD (gastroesophageal reflux disease)   . Headache    "couple /month lately" (09/19/2014)  . Heart disease 12/09/2016  . High blood pressure   . High cholesterol   . History of blood transfusion 1985   related to hysterectomy  . History of nuclear stress test    Myoview 1/19:  EF 65, no ischemia or scar  . History of pulmonary embolus (PE) 09/19/2014  . 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   . Hyperlipidemia 10/28/2013  . Hypertension   . IBS (irritable bowel syndrome)   . Lumbar central spinal stenosis (moderate at L4-5; mild at L2-3 and L3-4) 01/29/2016  . Migraine    "a few times/yr" (09/19/2014)  . Myocardial infarction ([place] 10/2010 X 3   "while hospitalized"  . Neck pain 01/29/2016  . Osteoarthritis    "qwhere; mostly around my joints" (09/19/2014)  . Pleural effusion, bilateral 07/31/2015  . Pneumonia 04/2014  . PTSD (post-traumatic stress disorder)   . Pulmonary embolism ([place] 04/2001; 09/19/2014   "after gallbladder OR; "  . Schizoaffective disorder (  [place])   . Schizoaffective disorder, depressive type ([place]) 08/28/2016  . Sleep apnea    "mild" (09/19/2014)  . Snoring    a. sleep study 5/16: No OSA  . SOB (shortness of breath) 07/31/2015  . Spinal stenosis   . Spondylosis   . Suicide attempt ([place]) 08/27/2016  . Type II diabetes mellitus ([place])    "dx'd in 2007; lost weight; no RX for ~ 4 yr now" (09/19/2014)  . Wheezing 02/25/2017    Past Surgical History:  Procedure Laterality Date  . ABDOMINAL HYSTERECTOMY  1985  . ANTERIOR CERVICAL DECOMP/DISCECTOMY FUSION   10/2012   in [place] C5-C7   . APPENDECTOMY  1985  . BACK SURGERY    . BREAST CYST EXCISION Right   . BREAST EXCISIONAL BIOPSY Right YRS AGO   NEG  . CARDIAC CATHETERIZATION   2000's; 2009  . CHOLECYSTECTOMY  2002  . COLONOSCOPY WITH PROPOFOL N/A 12/12/2016   Procedure: COLONOSCOPY WITH PROPOFOL;  Surgeon: [name], MD;  Location[facility] ENDOSCOPY;  Service: Endoscopy;  Laterality: N/A;  . COLONOSCOPY WITH PROPOFOL N/A 02/17/2017   Procedure: COLONOSCOPY WITH PROPOFOL;  Surgeon: [name], MD;  Location: [facility] ENDOSCOPY;  Service: Gastroenterology;  Laterality: N/A;  . ESOPHAGOGASTRODUODENOSCOPY (EGD) WITH PROPOFOL N/A 02/17/2017   Procedure: ESOPHAGOGASTRODUODENOSCOPY (EGD) WITH PROPOFOL;  Surgeon: [name], MD;  Location[facility] ENDOSCOPY;  Service: Gastroenterology;  Laterality: N/A;  . EXCISION/RELEASE BURSA HIP Right 12/1984  . HERNIA REPAIR  1985  . LEFT HEART CATHETERIZATION WITH CORONARY ANGIOGRAM N/A 12/09/2014   Procedure: LEFT HEART CATHETERIZATION WITH CORONARY ANGIOGRAM;  Surgeon: Burnell Blanks, MD;  Location: [facility] CATH LAB;  Service: Cardiovascular;  Laterality: N/A;  . TONSILLECTOMY AND ADENOIDECTOMY  ~ 1968  . TOTAL HIP ARTHROPLASTY Left 04-06-2014  . UMBILICAL HERNIA REPAIR  1985  . VENA CAVA FILTER PLACEMENT  03/2014    Family History  Problem Relation Age of Onset  . Stroke Mother        Deceased  . Lung cancer Father        Deceased  . Healthy Daughter   . Healthy Son        x 2  . Other Son        Suicide  . Cancer Brother   . Heart attack Neg Hx     SOCIAL HX: former smoker   Current Outpatient Medications:  .  acetaminophen (TYLENOL) 500 MG tablet, Take 1 tablet (500 mg total) by mouth every 6 (six) hours as needed., Disp: 30 tablet, Rfl: 0 .  blood glucose meter kit and supplies KIT, Dispense based on patient and insurance preference. Use once daily as directed.  For ICD 10 code E11.9., Disp: 1 each, Rfl: 0 .  carvedilol (COREG) 3.125 MG  tablet, Take 1 tablet (3.125 mg total) by mouth 2 (two) times daily with a meal., Disp: 60 tablet, Rfl: 11 .  cyclobenzaprine (FLEXERIL) 10 MG tablet, TAKE 1 TABLET BY MOUTH THREE TIMES A DAY AS NEEDED FOR MUSCLE SPASMS, Disp: 30 tablet, Rfl: 0 .  diclofenac sodium (VOLTAREN) 1 % GEL, Apply 2 g topically 4 (four) times daily. Right arm/shoulder arthritis, Disp: 1 Tube, Rfl: 2 .  dicyclomine (BENTYL) 20 MG tablet, TAKE 1 TABLET BY MOUTH 3 TIMES A DAY AS NEEDED FOR SPASMS, Disp: 270 tablet, Rfl: 0 .  diphenhydrAMINE (BENADRYL) 25 MG tablet, Take 1 tablet (25 mg total) by mouth every 6 (six) hours., Disp: 12 tablet, Rfl: 0 .  ELIQUIS 5 MG TABS  tablet, TAKE 1 TABLET (5 MG TOTAL) BY MOUTH 2 (TWO) TIMES DAILY., Disp: 60 tablet, Rfl: 5 .  escitalopram (LEXAPRO) 20 MG tablet, Take 1 tablet (20 mg total) by mouth daily., Disp: 90 tablet, Rfl: 1 .  gabapentin (NEURONTIN) 600 MG tablet, Take 1-1.5 tablets (600-900 mg total) by mouth 2 (two) times daily. Take 1 tablet in the AM and 1.5 tablet at bedtime, Disp: 225 tablet, Rfl: 1 .  isosorbide mononitrate (IMDUR) 30 MG 24 hr tablet, TAKE 1.5 TABLETS (45 MG TOTAL) BY MOUTH DAILY., Disp: 45 tablet, Rfl: 6 .  levothyroxine (SYNTHROID, LEVOTHROID) 25 MCG tablet, TAKE 1 TABLET BY MOUTH BEFORE BREAKFAST 2-3 HOURS BEFORE TAKING ANY OTHER MEDICATION OR SUPPLEMENT, Disp: 90 tablet, Rfl: 0 .  lidocaine (LIDODERM) 5 %, Place 1 patch onto the skin daily. Remove & Discard patch within 12 hours., Disp: 30 patch, Rfl: 0 .  Multiple Vitamins-Minerals (SUPER THERA VITE M) TABS, Take by mouth., Disp: , Rfl:  .  NIFEdipine (PROCARDIA) 10 MG capsule, TAKE 1 CAPSULE BY MOUTH 2 TIMES DAILY. TAKE 30 MINS BEFORE MEALS UP TO 1X PER DAY, Disp: 180 capsule, Rfl: 1 .  OLANZapine (ZYPREXA) 10 MG tablet, Take 1 tablet (10 mg total) by mouth at bedtime., Disp: 90 tablet, Rfl: 1 .  ondansetron (ZOFRAN) 4 MG tablet, Take 1 tablet (4 mg total) by mouth 2 (two) times daily as needed for nausea or  vomiting., Disp: 30 tablet, Rfl: 0 .  pantoprazole (PROTONIX) 40 MG tablet, TAKE 1 TABLET BY MOUTH EVERY DAY, Disp: 60 tablet, Rfl: 0 .  pravastatin (PRAVACHOL) 10 MG tablet, TAKE 1 TABLET BY MOUTH EVERYDAY AT BEDTIME, Disp: 90 tablet, Rfl: 1 .  ROZEREM 8 MG tablet, TAKE 1 TABLET (8 MG TOTAL) BY MOUTH AT BEDTIME., Disp: 90 tablet, Rfl: 2 .  sitaGLIPtin (JANUVIA) 100 MG tablet, Take 1 tablet (100 mg total) by mouth daily., Disp: 90 tablet, Rfl: 1 .  nitroGLYCERIN (NITROSTAT) 0.4 MG SL tablet, Place 1 tablet (0.4 mg total) under the tongue every 5 (five) minutes as needed for chest pain., Disp: 25 tablet, Rfl: 6  EXAM:  VITALS per patient if applicable: None  GENERAL: alert, oriented, appears well and in no acute distress  HEENT: atraumatic, conjunttiva clear, no obvious abnormalities on inspection of external nose and ears  NECK: normal movements of the head and neck  LUNGS: on inspection no signs of respiratory distress, breathing rate appears normal, no obvious gross SOB, gasping or wheezing  CV: no obvious cyanosis  MS: moves all visible extremities without noticeable abnormality  PSYCH/NEURO: pleasant and cooperative, no obvious depression or anxiety, speech and thought processing grossly intact  ASSESSMENT AND PLAN:  Discussed the following assessment and plan:  Irritable bowel syndrome with both constipation and diarrhea  Nausea - Plan: ondansetron (ZOFRAN) 4 MG tablet  Diabetes mellitus without complication (HCC)  Hypercementosis  IBS (irritable bowel syndrome) Seems to have constipation predominant IBS.  Discussed MiraLAX though she does not want to take this.  She cannot take Metamucil as it gives her bloating.  She is interested in trying Colace over-the-counter and advised she could try this.  If her constipation is not improving over the next week she will let us know.  Her nausea is likely related to her chronic constipation.  Zofran will be refilled for a short-term  supply.  Diabetes mellitus without complication ([place]) Continue Januvia.  Plan to check A1c he had a visit in the office in 3 months.  Hypercementosis Patient underwent evaluation with endocrinology.  Negative lab work.  She needs to follow with a dentist at this time.  She will let us know if she has any dental issues in the interim.     I discussed the assessment and treatment plan with the patient. The patient was provided an opportunity to ask questions and all were answered. The patient agreed with the plan and demonstrated an understanding of the instructions.   The patient was advised to call back or seek an in-person evaluation if the symptoms worsen or if the condition fails to improve as anticipated.  I provided 25 minutes of non-face-to-face time during this encounter.   [name], MD

## 2018-11-29 NOTE — Assessment & Plan Note (Signed)
Seems to have constipation predominant IBS.  Discussed MiraLAX though she does not want to take this.  She cannot take Metamucil as it gives her bloating.  She is interested in trying Colace over-the-counter and advised she could try this.  If her constipation is not improving over the next week she will let us know.  Her nausea is likely related to her chronic constipation.  Zofran will be refilled for a short-term supply.

## 2018-11-29 NOTE — Assessment & Plan Note (Signed)
Patient underwent evaluation with endocrinology.  Negative lab work.  She needs to follow with a dentist at this time.  She will let us know if she has any dental issues in the interim.

## 2018-11-29 NOTE — Telephone Encounter (Signed)
Called and spoke with pt. Pt has been scheduled for 3 month folow up appt for July 31,2020

## 2018-12-05 ENCOUNTER — Other Ambulatory Visit: Payer: Self-pay | Admitting: Family Medicine

## 2018-12-05 DIAGNOSIS — Z760 Encounter for issue of repeat prescription: Secondary | ICD-10-CM

## 2018-12-08 IMAGING — DX DG ELBOW COMPLETE 3+V*L*
4 series · 4 of 4 positions shown · non-contrast
Comparison: 03/02/2016 .

CLINICAL DATA: Left elbow pain.  Fall.

EXAM:
LEFT ELBOW - COMPLETE 3+ VIEW

[elbow ap]
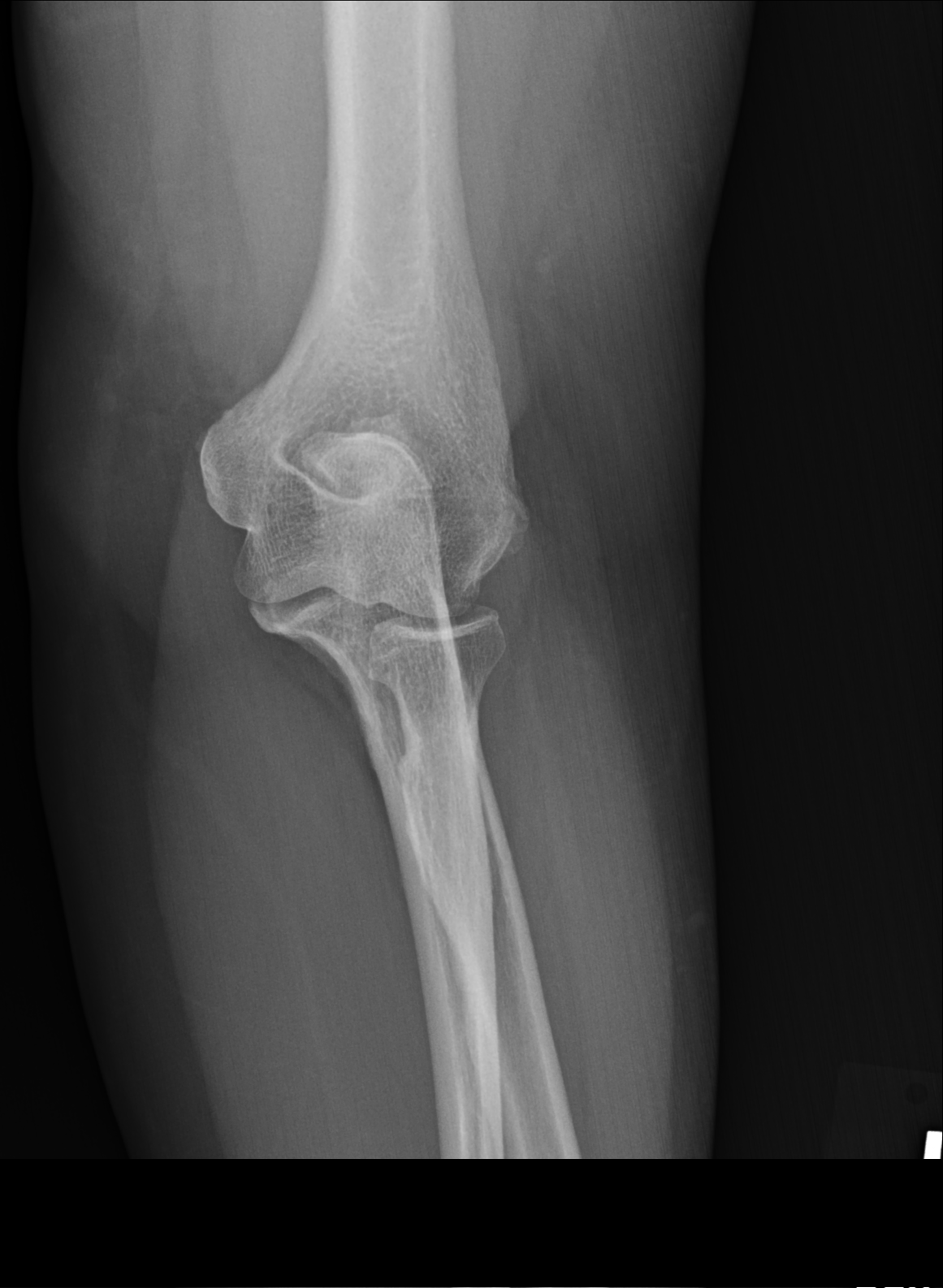

[elbow obl (oblique) (1 of 2)]
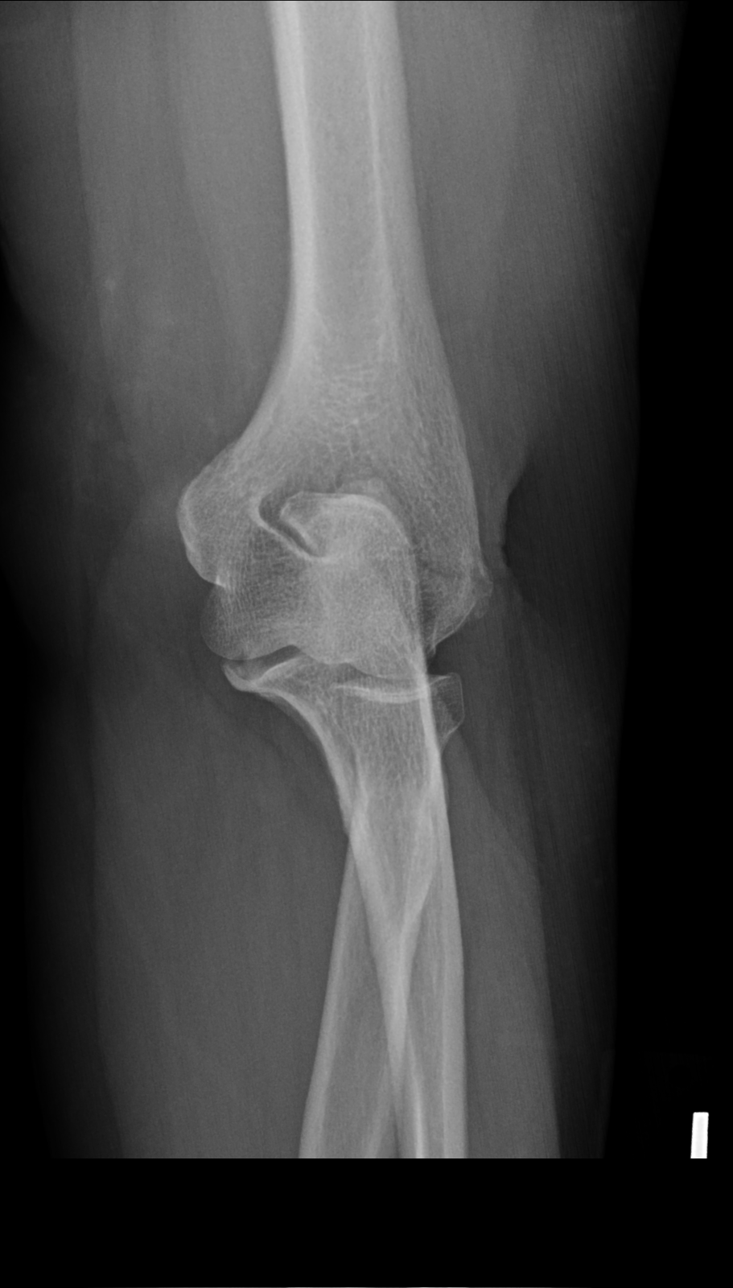

[elbow obl (oblique) (2 of 2)]
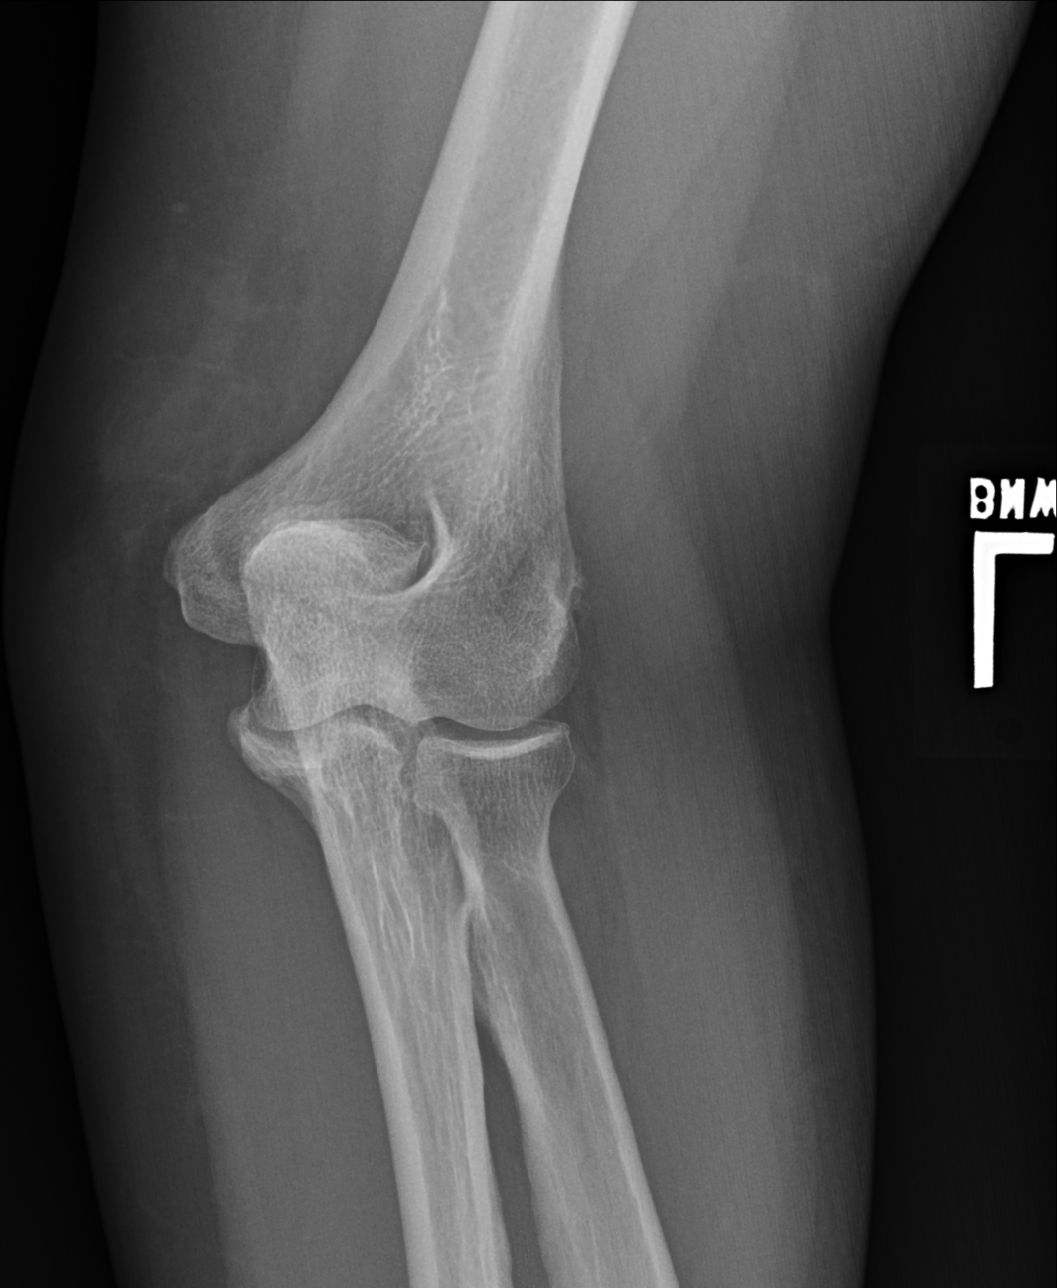

[elbow lat]
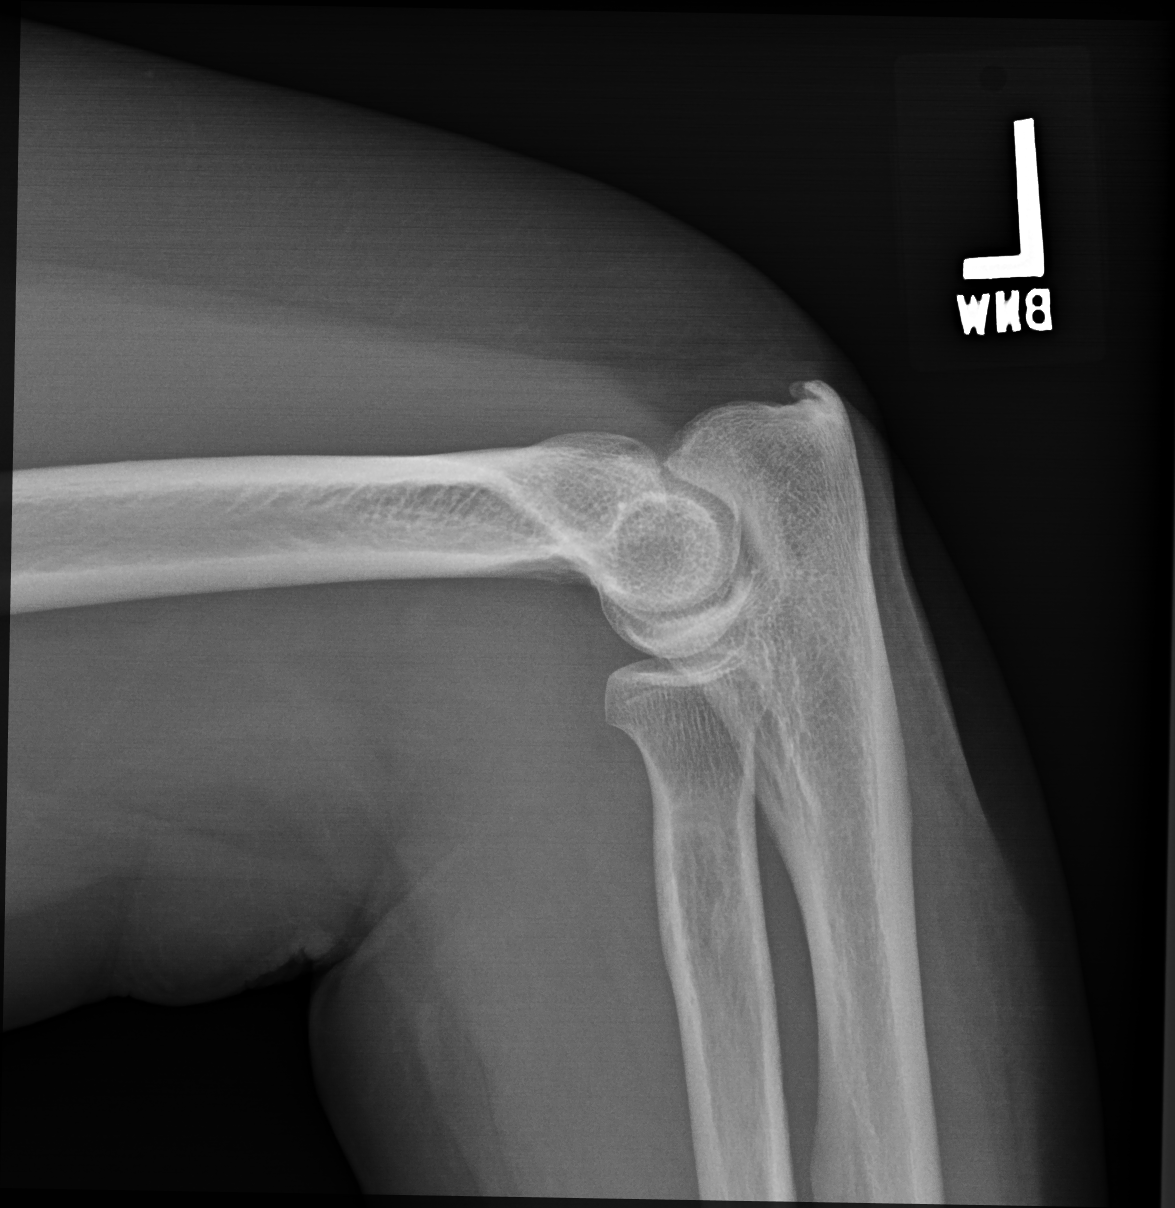

[4 of 4 positions shown; findings below may reference images not displayed]

FINDINGS: Soft tissue structures are unremarkable. Degenerative changes left
elbow. Lucency noted over the distal humerus appears to represent a
skin fold. No acute bony abnormality. If symptoms persist follow-up
imaging in 7-10 days suggested.
IMPRESSION: Degenerative changes left elbow.  No acute abnormality identified.

## 2018-12-08 IMAGING — DX DG HIP (WITH OR WITHOUT PELVIS) 2-3V*L*
3 series · 3 of 3 positions shown · non-contrast
Comparison: Pelvis radiograph September 01, 2015; right hip CT
September 01, 2015

CLINICAL DATA: Pain following fall

EXAM:
DG HIP (WITH OR WITHOUT PELVIS) 2-3V LEFT

[pelvis ap]
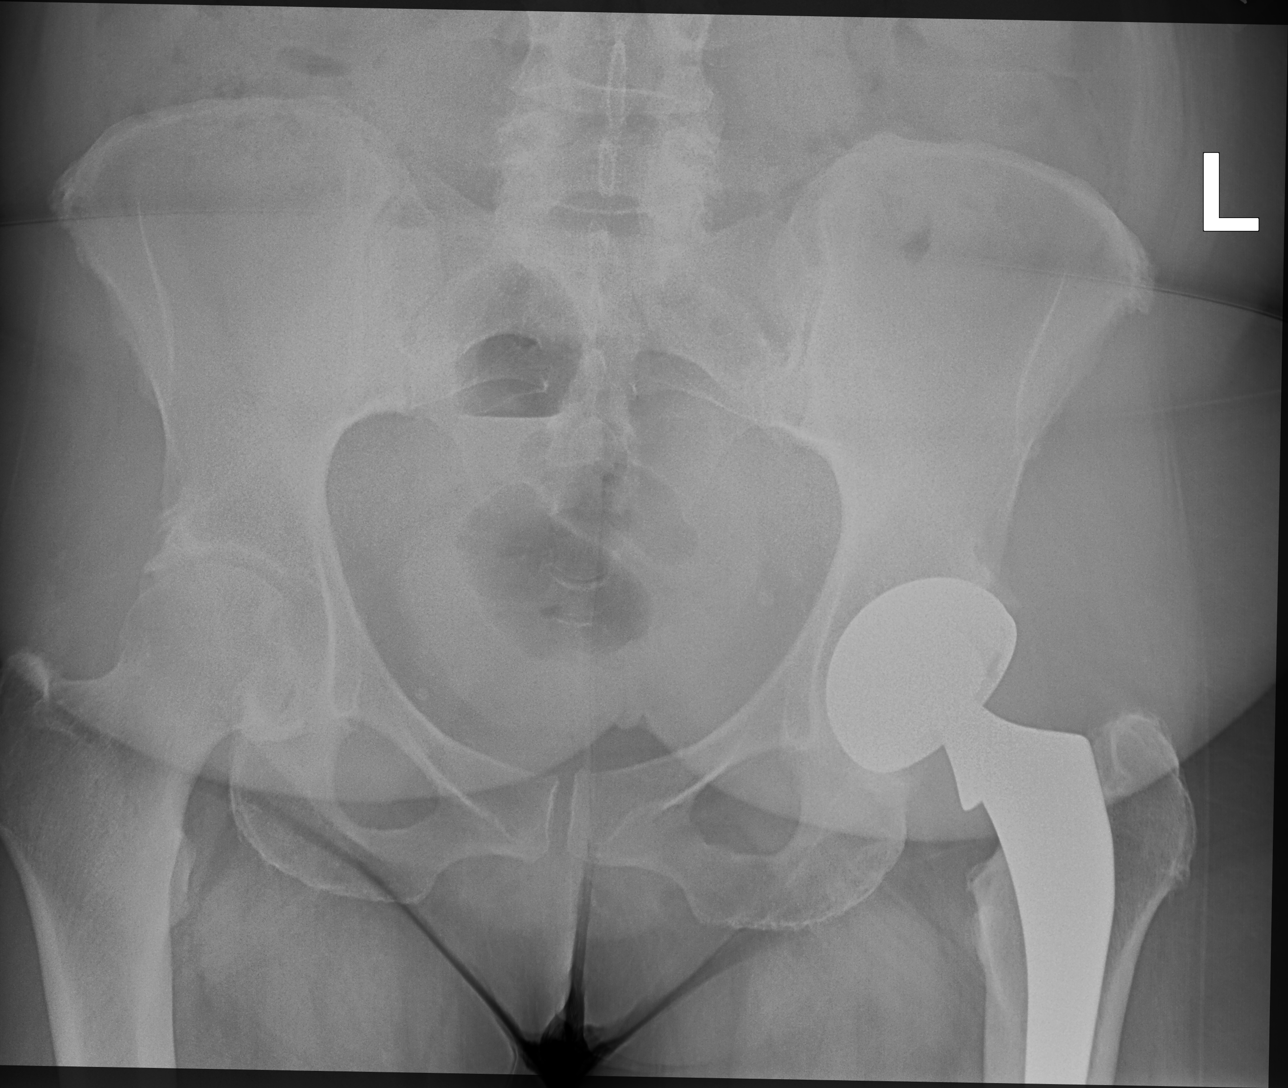

[hip joint ap]
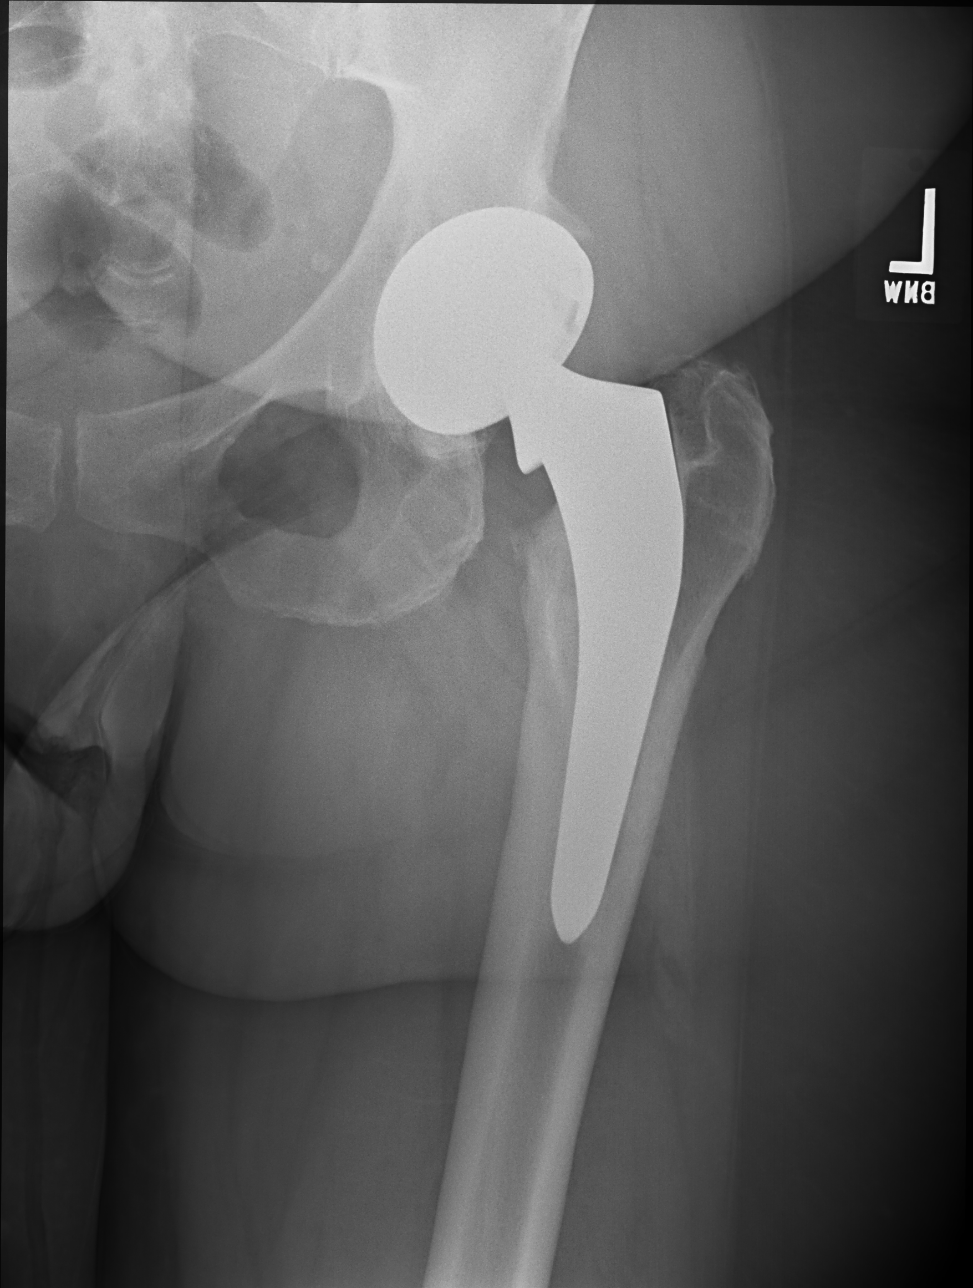

[ap frog obl (oblique)]
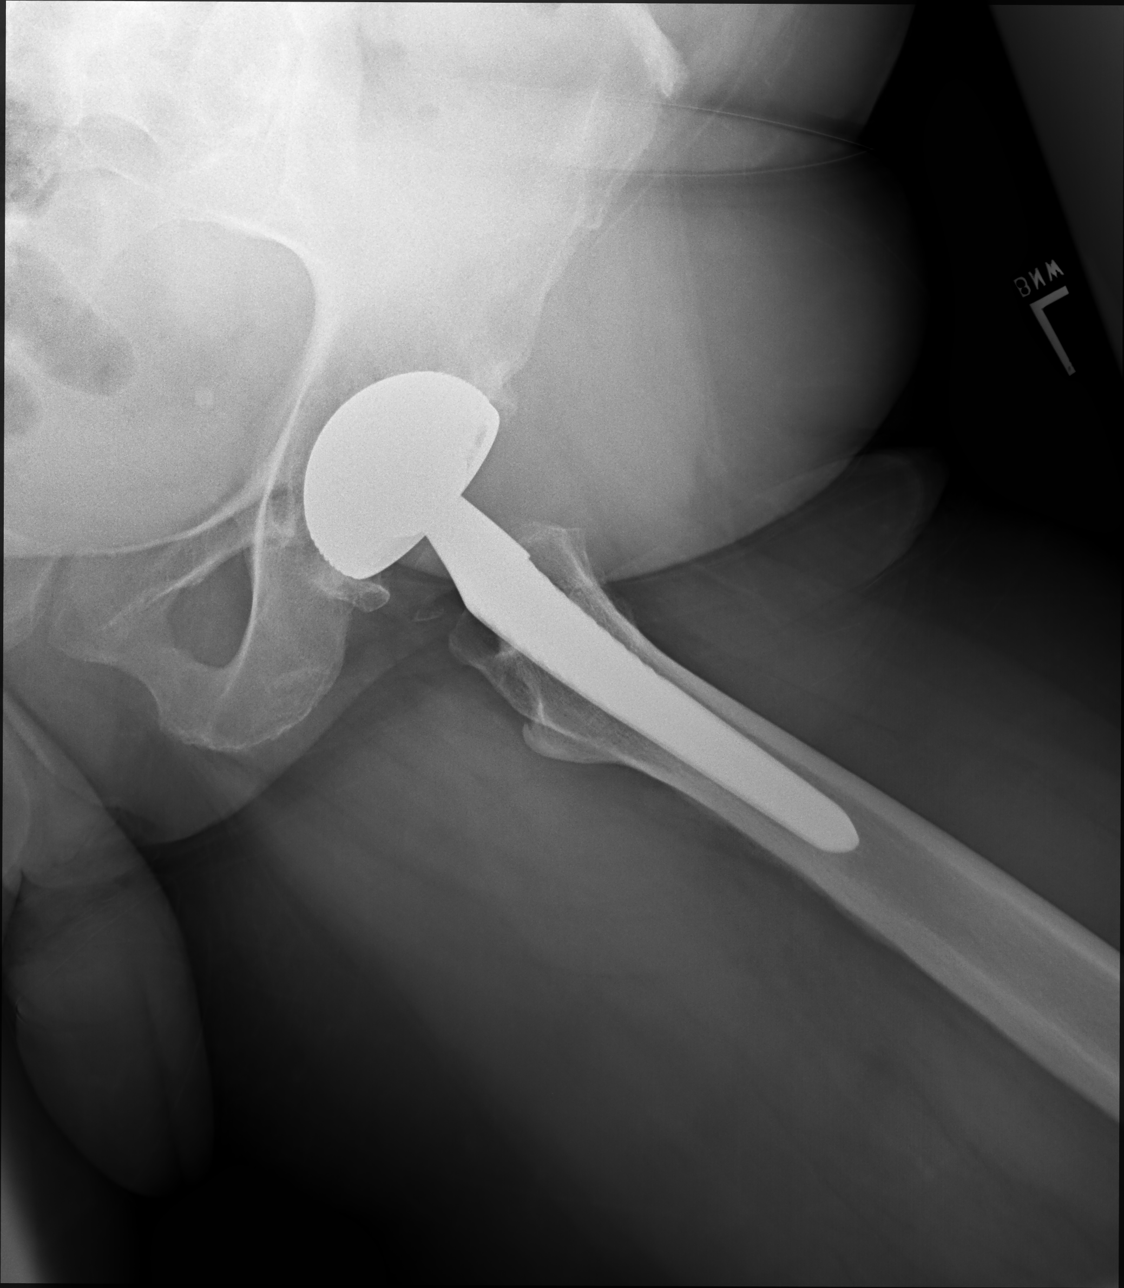

[3 of 3 positions shown; findings below may reference images not displayed]

FINDINGS: Frontal pelvis as well as frontal and lateral left hip images were
obtained. There is a total hip replacement on the left with
prosthetic components appearing well-seated. No acute fracture or
dislocation is evident. Moderate osteoarthritic change in the right
hip is stable. No erosive change.
IMPRESSION: No acute fracture or dislocation. Total hip replacement on the left
with prosthetic components well-seated. Moderate osteoarthritic
change right hip joint, stable.

## 2018-12-09 ENCOUNTER — Encounter: Payer: Self-pay | Admitting: Family Medicine

## 2018-12-09 ENCOUNTER — Ambulatory Visit (INDEPENDENT_AMBULATORY_CARE_PROVIDER_SITE_OTHER): Payer: Medicare HMO | Admitting: Family Medicine

## 2018-12-09 ENCOUNTER — Other Ambulatory Visit: Payer: Self-pay

## 2018-12-09 ENCOUNTER — Ambulatory Visit: Payer: Self-pay | Admitting: Family Medicine

## 2018-12-09 DIAGNOSIS — R1084 Generalized abdominal pain: Secondary | ICD-10-CM

## 2018-12-09 DIAGNOSIS — K582 Mixed irritable bowel syndrome: Secondary | ICD-10-CM

## 2018-12-09 DIAGNOSIS — R079 Chest pain, unspecified: Secondary | ICD-10-CM

## 2018-12-09 NOTE — Telephone Encounter (Signed)
Pt. C/o stomach pain that radiates into her chest. No cough, low grade temp. 99.4 yesterday, none today. States sometimes she hears wheezing when she lays down. Asking if these are corona virus symptoms. Reassured pt. They are not. States she has a lot of GI issues -"IBS, esophageal spasms." Would like a telephone visit if possible. Spoke with Santiago Glad in the practice and she will assist pt.   Answer Assessment - Initial Assessment Questions 1. LOCATION: "Where does it hurt?"      Below belly button 2. RADIATION: "Does the pain shoot anywhere else?" (e.g., chest, back)     Maybe into chest - but has IBS 3. ONSET: "When did the pain begin?" (e.g., minutes, hours or days ago)      Started yesterday 4. SUDDEN: "Gradual or sudden onset?"     Sudden 5. PATTERN "Does the pain come and go, or is it constant?"    - If constant: "Is it getting better, staying the same, or worsening?"      (Note: Constant means the pain never goes away completely; most serious pain is constant and it progresses)     - If intermittent: "How long does it last?" "Do you have pain now?"     (Note: Intermittent means the pain goes away completely between bouts)     Come and goes 6. SEVERITY: "How bad is the pain?"  (e.g., Scale 1-10; mild, moderate, or severe)   - MILD (1-3): doesn't interfere with normal activities, abdomen soft and not tender to touch    - MODERATE (4-7): interferes with normal activities or awakens from sleep, tender to touch    - SEVERE (8-10): excruciating pain, doubled over, unable to do any normal activities      4 7. RECURRENT SYMPTOM: "Have you ever had this type of abdominal pain before?" If so, ask: "When was the last time?" and "What happened that time?"      Yes - but awhile back 8. CAUSE: "What do you think is causing the abdominal pain?"     Maybe my IBS 9. RELIEVING/AGGRAVATING FACTORS: "What makes it better or worse?" (e.g., movement, antacids, bowel movement)     Anti-nausea medicine  helps 10. OTHER SYMPTOMS: "Has there been any vomiting, diarrhea, constipation, or urine problems?"       Nausea, low grade temp. Last night 99.4 11. PREGNANCY: "Is there any chance you are pregnant?" "When was your last menstrual period?"       No  Protocols used: ABDOMINAL PAIN - [facility]

## 2018-12-09 NOTE — Progress Notes (Signed)
Patient ID: Valerie Vazquez, female   DOB: 12/20/1955, 62 y.o.   MRN: [id number]  Virtual Visit via Video Note  This visit type was conducted due to national recommendations for restrictions regarding the COVID-19 pandemic (e.g. social distancing).  This format is felt to be most appropriate for this patient at this time.  All issues noted in this document were discussed and addressed.  No physical exam was performed (except for noted visual exam findings with Video Visits).   I connected with Valerie Vazquez on 12/09/18 at  2:20 PM EDT by a video enabled telemedicine application or telephone and verified that I am speaking with the correct person using two identifiers. Location patient: home Location provider: [facility] Persons participating in the virtual visit: patient, provider  I discussed the limitations, risks, security and privacy concerns of performing an evaluation and management service by video and the availability of in person appointments. I also discussed with the patient that there may be a patient responsible charge related to this service. The patient expressed understanding and agreed to proceed.   HPI:  Patient and I connected via video today due to complaints of generalized abdominal pain, diarrhea, low-grade fever of 99.4 and an episode of chest pain that occurred yesterday but is now resolved.  Patient is concerned that she could have some sort of virus and is wondering if her symptoms are related to the coronavirus.  Patient states she did go to the [employer] and [employer] yesterday, but otherwise has been trying to remain home much as possible.  Denies any vomiting, does have some nausea.  Denies any palpitations or extremity swelling.  Denies any urinary issues.  Denies any body aches or chills.  Denies any known exposure to someone diagnosed with COVID-19 or under investigation for COVID-19  ROS: See pertinent positives and negatives per HPI.  Past Medical  History:  Diagnosis Date  . Acute right-sided low back pain with right-sided sciatica 10/23/2017  . Anemia   . Bilateral renal cysts   . CAD (coronary artery disease) 09/28/2016  . Chest pain 10/14/2015  . Chronic back pain    "upper and lower" (09/19/2014)  . Chronic shoulder pain (Location of Primary Source of Pain) (Left) 01/29/2016  . Colitis 04/08/2016  . Coronary vasospasm (HCC) 12/09/2014  . Depression    "major" (09/19/2014)  . DVT (deep venous thrombosis) (HCC)   . Esophageal spasm   . Essential hypertension 10/28/2013  . Gallstones   . Gastritis   . Gastroenteritis   . GERD (gastroesophageal reflux disease)   . Headache    "couple /month lately" (09/19/2014)  . Heart disease 12/09/2016  . High blood pressure   . High cholesterol   . History of blood transfusion 1985   related to hysterectomy  . History of nuclear stress test    Myoview 1/19:  EF 65, no ischemia or scar  . History of pulmonary embolus (PE) 09/19/2014  . 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   Per patient diagnosed in [place]. Rule out Paget's disease of the bone with blood work.   . Hyperlipidemia 10/28/2013  . Hypertension   . IBS (irritable bowel syndrome)   . Lumbar central spinal stenosis (moderate at L4-5; mild at L2-3 and L3-4) 01/29/2016  . Migraine    "a few times/yr" (09/19/2014)  . Myocardial infarction (HCC) 10/2010 X 3   "while hospitalized"  . Neck pain 01/29/2016  . Osteoarthritis    "qwhere; mostly around my joints" (09/19/2014)  . Pleural effusion, bilateral   07/31/2015  . Pneumonia 04/2014  . PTSD (post-traumatic stress disorder)   . Pulmonary embolism ([place]) 04/2001; 09/19/2014   "after gallbladder OR; "  . Schizoaffective disorder ([place])   . Schizoaffective disorder, depressive type ([place]) 08/28/2016  . Sleep apnea    "mild" (09/19/2014)  . Snoring    a. sleep study 5/16: No OSA  . SOB (shortness of breath) 07/31/2015  . Spinal stenosis   . Spondylosis   . Suicide attempt ([place]) 08/27/2016  . Type II  diabetes mellitus ([place])    "dx'd in 2007; lost weight; no RX for ~ 4 yr now" (09/19/2014)  . Wheezing 02/25/2017    Past Surgical History:  Procedure Laterality Date  . ABDOMINAL HYSTERECTOMY  1985  . ANTERIOR CERVICAL DECOMP/DISCECTOMY FUSION  10/2012   in [place] C5-C7   . APPENDECTOMY  1985  . BACK SURGERY    . BREAST CYST EXCISION Right   . BREAST EXCISIONAL BIOPSY Right YRS AGO   NEG  . CARDIAC CATHETERIZATION   2000's; 2009  . CHOLECYSTECTOMY  2002  . COLONOSCOPY WITH PROPOFOL N/A 12/12/2016   Procedure: COLONOSCOPY WITH PROPOFOL;  Surgeon: [name], MD;  Location: [facility] ENDOSCOPY;  Service: Endoscopy;  Laterality: N/A;  . COLONOSCOPY WITH PROPOFOL N/A 02/17/2017   Procedure: COLONOSCOPY WITH PROPOFOL;  Surgeon: [name], MD;  Location: [facility] ENDOSCOPY;  Service: Gastroenterology;  Laterality: N/A;  . ESOPHAGOGASTRODUODENOSCOPY (EGD) WITH PROPOFOL N/A 02/17/2017   Procedure: ESOPHAGOGASTRODUODENOSCOPY (EGD) WITH PROPOFOL;  Surgeon: [name], MD;  Location: [facility] ENDOSCOPY;  Service: Gastroenterology;  Laterality: N/A;  . EXCISION/RELEASE BURSA HIP Right 12/1984  . HERNIA REPAIR  1985  . LEFT HEART CATHETERIZATION WITH CORONARY ANGIOGRAM N/A 12/09/2014   Procedure: LEFT HEART CATHETERIZATION WITH CORONARY ANGIOGRAM;  Surgeon: Burnell Blanks, MD;  Location: [facility] CATH LAB;  Service: Cardiovascular;  Laterality: N/A;  . TONSILLECTOMY AND ADENOIDECTOMY  ~ 1968  . TOTAL HIP ARTHROPLASTY Left 04-06-2014  . UMBILICAL HERNIA REPAIR  1985  . VENA CAVA FILTER PLACEMENT  03/2014    Family History  Problem Relation Age of Onset  . Stroke Mother        Deceased  . Lung cancer Father        Deceased  . Healthy Daughter   . Healthy Son        x 2  . Other Son        Suicide  . Cancer Brother   . Heart attack Neg Hx     Social History   Tobacco Use  . Smoking status: Former Smoker    Packs/day: 1.50    Years: 10.00    Pack years: 15.00    Types: Cigarettes    Last  attempt to quit: 04/12/1979    Years since quitting: 39.6  . Smokeless tobacco: Never Used  Substance Use Topics  . Alcohol use: No    Alcohol/week: 0.0 standard drinks    Current Outpatient Medications:  .  acetaminophen (TYLENOL) 500 MG tablet, Take 1 tablet (500 mg total) by mouth every 6 (six) hours as needed., Disp: 30 tablet, Rfl: 0 .  blood glucose meter kit and supplies KIT, Dispense based on patient and insurance preference. Use once daily as directed.  For ICD 10 code E11.9., Disp: 1 each, Rfl: 0 .  carvedilol (COREG) 3.125 MG tablet, Take 1 tablet (3.125 mg total) by mouth 2 (two) times daily with a meal., Disp: 60 tablet, Rfl: 11 .  cyclobenzaprine (FLEXERIL) 10 MG tablet, TAKE 1  TABLET BY MOUTH THREE TIMES A DAY AS NEEDED FOR MUSCLE SPASMS, Disp: 30 tablet, Rfl: 0 .  diclofenac sodium (VOLTAREN) 1 % GEL, Apply 2 g topically 4 (four) times daily. Right arm/shoulder arthritis, Disp: 1 Tube, Rfl: 2 .  dicyclomine (BENTYL) 20 MG tablet, TAKE 1 TABLET BY MOUTH 3 TIMES A DAY AS NEEDED FOR SPASMS, Disp: 270 tablet, Rfl: 0 .  diphenhydrAMINE (BENADRYL) 25 MG tablet, Take 1 tablet (25 mg total) by mouth every 6 (six) hours., Disp: 12 tablet, Rfl: 0 .  ELIQUIS 5 MG TABS tablet, TAKE 1 TABLET (5 MG TOTAL) BY MOUTH 2 (TWO) TIMES DAILY., Disp: 60 tablet, Rfl: 5 .  escitalopram (LEXAPRO) 20 MG tablet, Take 1 tablet (20 mg total) by mouth daily., Disp: 90 tablet, Rfl: 1 .  gabapentin (NEURONTIN) 600 MG tablet, Take 1-1.5 tablets (600-900 mg total) by mouth 2 (two) times daily. Take 1 tablet in the AM and 1.5 tablet at bedtime, Disp: 225 tablet, Rfl: 1 .  isosorbide mononitrate (IMDUR) 30 MG 24 hr tablet, TAKE 1.5 TABLETS (45 MG TOTAL) BY MOUTH DAILY., Disp: 45 tablet, Rfl: 6 .  levothyroxine (SYNTHROID, LEVOTHROID) 25 MCG tablet, TAKE 1 TABLET BY MOUTH BEFORE BREAKFAST 2-3 HOURS BEFORE TAKING ANY OTHER MEDICATION OR SUPPLEMENT, Disp: 90 tablet, Rfl: 0 .  lidocaine (LIDODERM) 5 %, Place 1 patch onto  the skin daily. Remove & Discard patch within 12 hours., Disp: 30 patch, Rfl: 0 .  Multiple Vitamins-Minerals (SUPER THERA VITE M) TABS, Take by mouth., Disp: , Rfl:  .  NIFEdipine (PROCARDIA) 10 MG capsule, TAKE 1 CAPSULE BY MOUTH 2 TIMES DAILY. TAKE 30 MINS BEFORE MEALS UP TO 1X PER DAY, Disp: 180 capsule, Rfl: 1 .  nitroGLYCERIN (NITROSTAT) 0.4 MG SL tablet, Place 1 tablet (0.4 mg total) under the tongue every 5 (five) minutes as needed for chest pain., Disp: 25 tablet, Rfl: 6 .  OLANZapine (ZYPREXA) 10 MG tablet, Take 1 tablet (10 mg total) by mouth at bedtime., Disp: 90 tablet, Rfl: 1 .  ondansetron (ZOFRAN) 4 MG tablet, Take 1 tablet (4 mg total) by mouth 2 (two) times daily as needed for nausea or vomiting., Disp: 30 tablet, Rfl: 0 .  pantoprazole (PROTONIX) 40 MG tablet, TAKE 1 TABLET BY MOUTH EVERY DAY, Disp: 60 tablet, Rfl: 0 .  pravastatin (PRAVACHOL) 10 MG tablet, TAKE 1 TABLET BY MOUTH EVERYDAY AT BEDTIME, Disp: 90 tablet, Rfl: 1 .  ROZEREM 8 MG tablet, TAKE 1 TABLET (8 MG TOTAL) BY MOUTH AT BEDTIME., Disp: 90 tablet, Rfl: 2 .  sitaGLIPtin (JANUVIA) 100 MG tablet, Take 1 tablet (100 mg total) by mouth daily., Disp: 90 tablet, Rfl: 1  EXAM:  VITALS per patient if applicable: temp 99.4 yesterday.   GENERAL: alert, oriented, appears well and in no acute distress  HEENT: atraumatic, conjunttiva clear, no obvious abnormalities on inspection of external nose and ears  NECK: normal movements of the head and neck  LUNGS: on inspection no signs of respiratory distress, breathing rate appears normal, no obvious gross SOB, gasping or wheezing  CV: no obvious cyanosis  MS: moves all visible extremities without noticeable abnormality  PSYCH/NEURO: pleasant and cooperative, no obvious depression or anxiety, speech and thought processing grossly intact  ASSESSMENT AND PLAN:  Discussed the following assessment and plan:  Generalized abdominal pain  Irritable bowel syndrome with both  constipation and diarrhea  Chest pain, unspecified type  Generalized abdominal pain/IBS with constipation and diarrhea- suspect pain could either be from   a flareup of IBS and/or a viral gastroenteritis.  Patient currently has Zofran at home to use as needed for nausea, does not require any refill at this time.  Advised patient to follow a bland and clear liquid diet for the next few days, discussed options such as ginger ale, Pedialyte, Gatorade, chicken broth, rice, plain pasta, white toast, bananas and to slowly advance diet as tolerated.  Advised she can use Tylenol if needed for any pain.  Chest pain-chest pain episode was very short and has all resolved.  She has no other cardiac symptoms.  I wonder if chest pain is related to an acid reflux flareup.  Patient would prefer not to have to go to the urgent care, ER or come to clinic at this time for EKG.  She will monitor self for any more recurrence of chest pain symptoms and call clinic as well if they occur and will get further instruction at that time.  Advised patient that her symptoms do not seem to coincide with the novel coronavirus, but due to the pandemic it is recommended that all people remain home as much as possible.  It is also recommended that if you do leave home to wear a cloth mask.  We also discussed good handwashing.  Discussed alarm symptoms that would prompt a visit to the emergency room such as shortness of breath, resurgence of chest pain, severe abdominal pain.   I discussed the assessment and treatment plan with the patient. The patient was provided an opportunity to ask questions and all were answered. The patient agreed with the plan and demonstrated an understanding of the instructions.   The patient was advised to call back or seek an in-person evaluation if the symptoms worsen or if the condition fails to improve as anticipated.  I provided 15 minutes of video face-to-face time during this encounter.    M  , FNP   

## 2018-12-10 ENCOUNTER — Telehealth: Payer: Self-pay | Admitting: Family Medicine

## 2018-12-10 NOTE — Telephone Encounter (Signed)
Insurance is requesting alternative Rx for patient- please review request.

## 2018-12-10 NOTE — Telephone Encounter (Signed)
[employer] representative called saying that they don't cover the Nifedipine 10mg  in this 2020 year, but there are three alternatives.  Please call provider services at [phone]

## 2018-12-15 IMAGING — DX DG CERVICAL SPINE COMPLETE 4+V
6 series · 6 of 6 positions shown · non-contrast
Comparison: Chest x-ray 11/26/2016 .

CLINICAL DATA: Paresthesias left arm.  Fall .

EXAM:
CERVICAL SPINE - COMPLETE 4+ VIEW

[cervical spine ap]
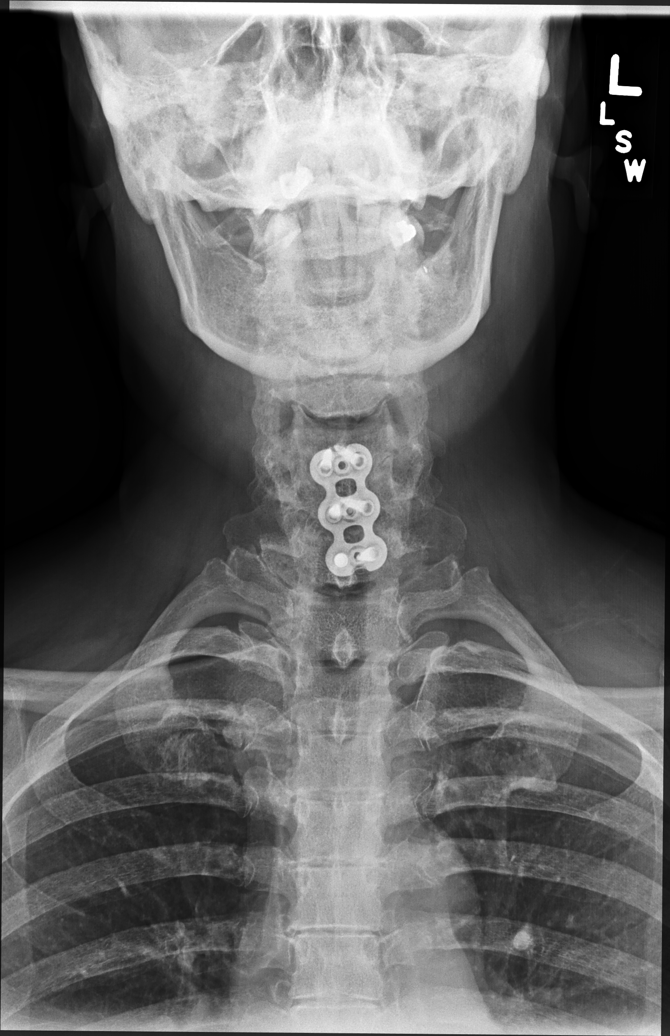

[cervical spine oblique (1 of 2)]
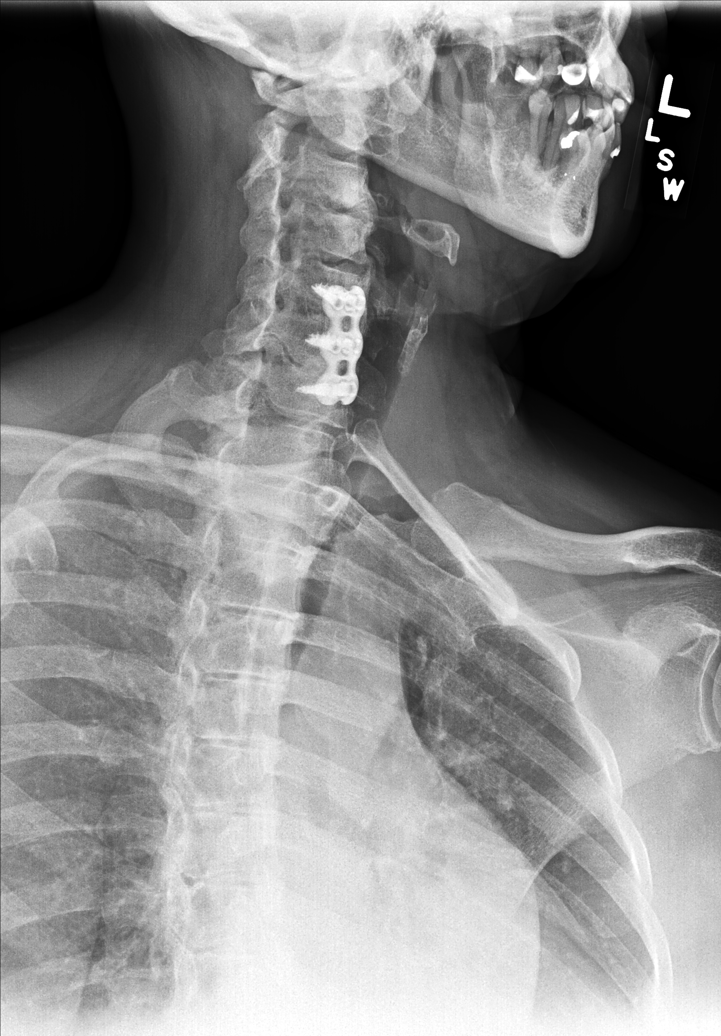

[cervical spine oblique (2 of 2)]
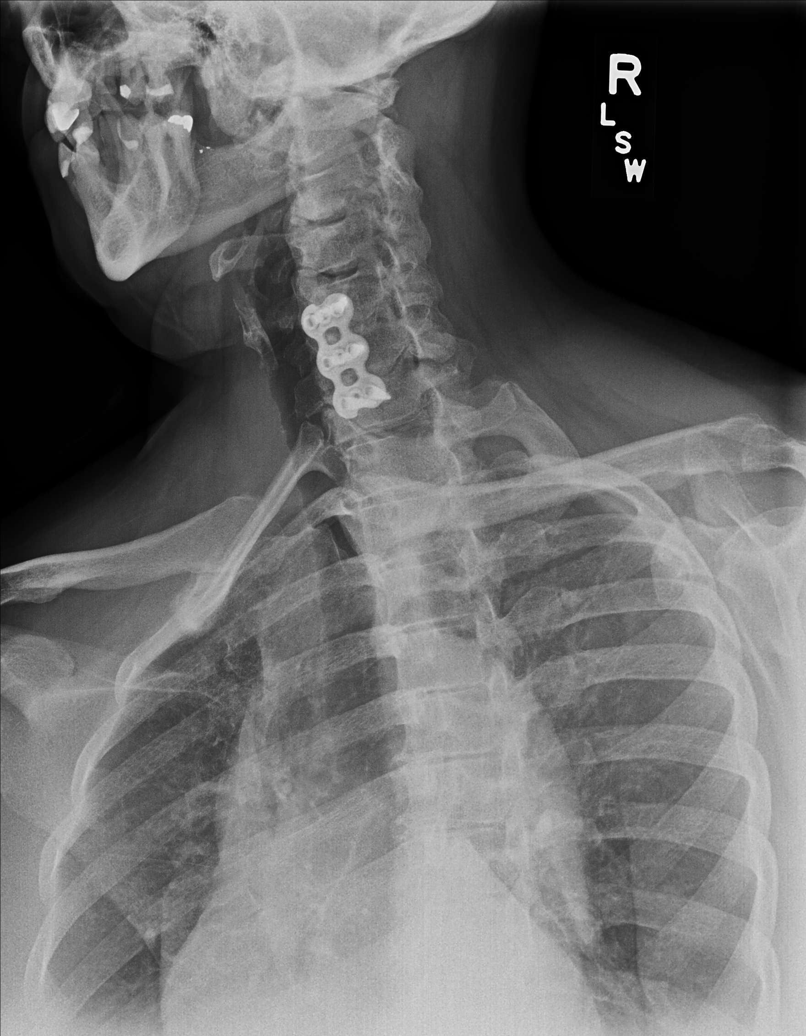

[cervical spine lat]
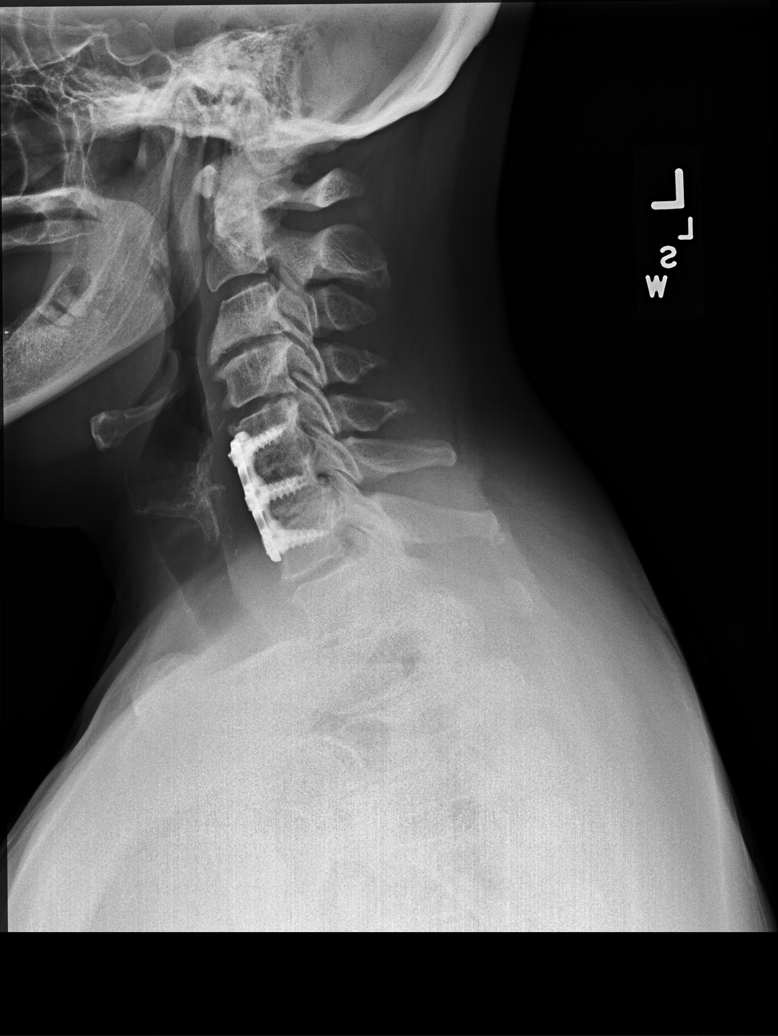

[swimmers lat]
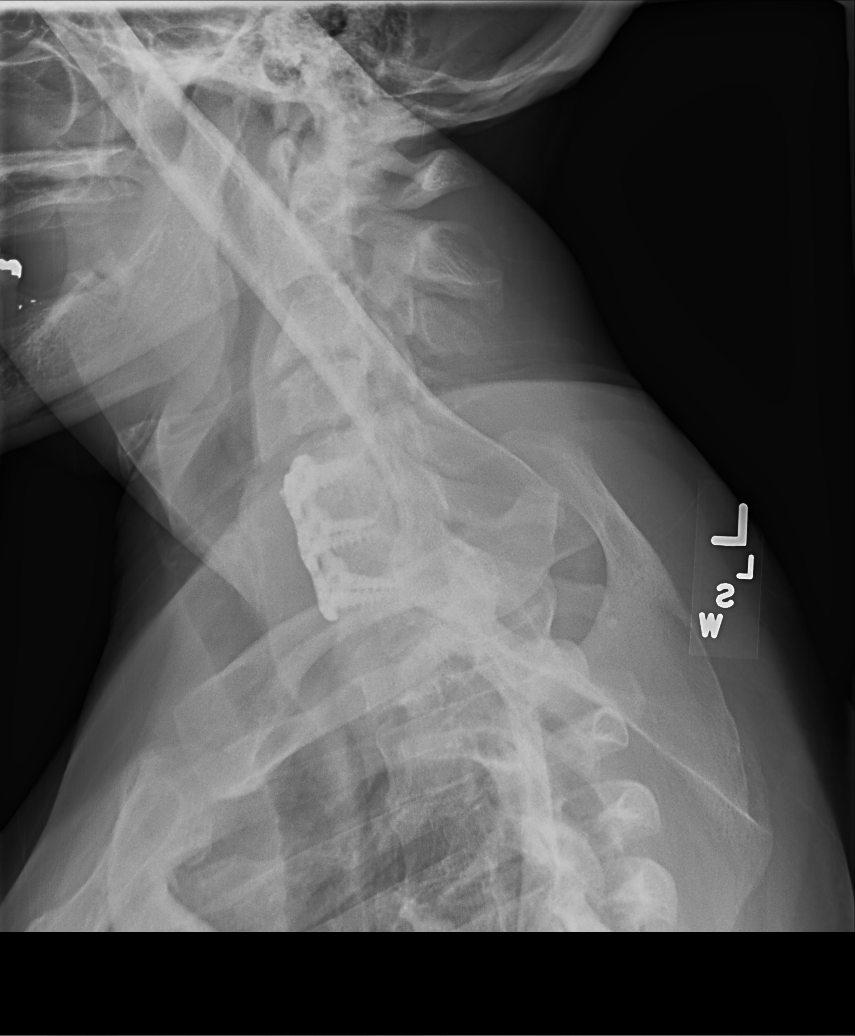

[cervical spine open mouth ap]
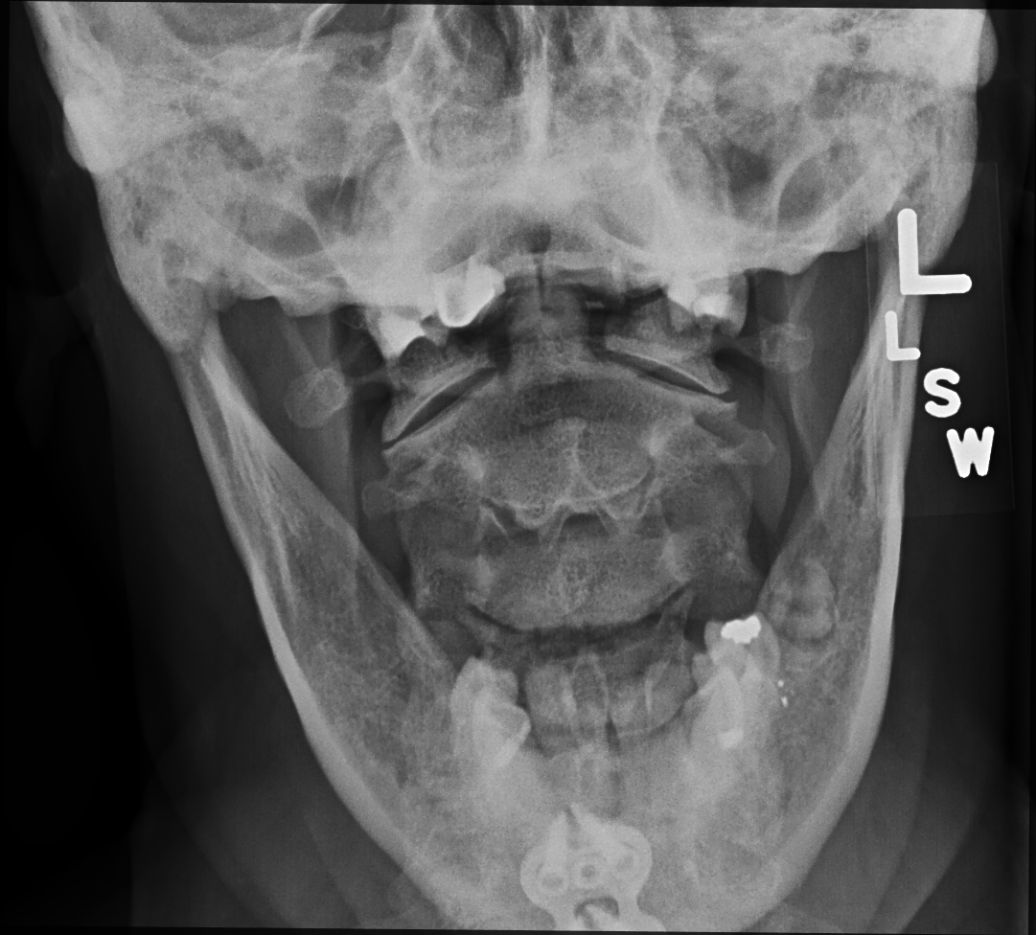

[6 of 6 positions shown; findings below may reference images not displayed]

FINDINGS: C5 through C7 anterior fusion. Diffuse degenerative change. No acute
bony abnormality identified. Pulmonary apices are clear.
IMPRESSION: C5 through C7 anterior fusion with anatomic alignment. Hardware
intact.

## 2018-12-16 ENCOUNTER — Other Ambulatory Visit: Payer: Self-pay | Admitting: Psychiatry

## 2018-12-16 DIAGNOSIS — F250 Schizoaffective disorder, bipolar type: Secondary | ICD-10-CM

## 2018-12-16 MED ORDER — OLANZAPINE 10 MG PO TABS: 10.0000 mg | ORAL_TABLET | Freq: Every day | ORAL | 0 refills | Status: DC

## 2018-12-16 NOTE — Telephone Encounter (Signed)
Sent zyprexa to pharmacy

## 2018-12-17 ENCOUNTER — Other Ambulatory Visit: Payer: Self-pay | Admitting: Family Medicine

## 2018-12-17 NOTE — Telephone Encounter (Signed)
This medication is for her esophageal spasms.  It is not for her blood pressure.  Those medications would not be indicated for this issue.  Please contact her insurance and see if that makes a difference.  If they refuse the nifedipine please see if they would approve diltiazem.

## 2018-12-17 NOTE — Telephone Encounter (Signed)
Called Blair Hailey [phone]   There are a lot of alteratives a few were Norvasc, atenolol, verapamil, or lisinopril.   However, we can do a PA if pt has been stable on this medication.   Would you like for me to try the PA first then consistor alternatives?

## 2018-12-17 NOTE — Telephone Encounter (Signed)
Called pt and left a VM to call me back to discuss medication.

## 2018-12-20 ENCOUNTER — Other Ambulatory Visit: Payer: Self-pay | Admitting: Family Medicine

## 2018-12-20 DIAGNOSIS — E063 Autoimmune thyroiditis: Secondary | ICD-10-CM

## 2018-12-20 NOTE — Telephone Encounter (Signed)
Last OV 12/09/2018  Last refilled 09/21/2018 disp 270 with no refills   Next OV 01/11/2019  Sent to PCP for approval

## 2018-12-20 NOTE — Telephone Encounter (Signed)
Please contact the patient and see how frequently she takes this medication. Is she taking it as needed or on a schedule?

## 2018-12-21 ENCOUNTER — Telehealth: Payer: Self-pay

## 2018-12-21 MED ORDER — DICYCLOMINE HCL 20 MG PO TABS: ORAL_TABLET | ORAL | 0 refills | Status: DC

## 2018-12-21 NOTE — Telephone Encounter (Signed)
Noted.  If she is needing it that frequently I would suggest we have her follow-up with GI.  Please see if she has been able to get scheduled with them.

## 2018-12-21 NOTE — Telephone Encounter (Signed)
Thank you for confirming.  I would suggest she follow-up with them.  I placed a referral previously for this.  As long as she is willing to see them we can get her back into follow-up.

## 2018-12-21 NOTE — Telephone Encounter (Signed)
Sent patient a mychart message as well.

## 2018-12-21 NOTE — Addendum Note (Signed)
Addended by: [name] on: 12/21/2018 01:29 PM   Modules accepted: Orders

## 2018-12-21 NOTE — Telephone Encounter (Signed)
Called patient because a refill request came in over the interface for Dicyclomine and the provider wanted to Please contact the patient and see how frequently she takes this medication. Is she taking it as needed or on a schedule?  Nayelli Inglis,cma

## 2018-12-21 NOTE — Telephone Encounter (Signed)
Called pt and left a VM to call back. CRM created and sent to [facility] pool. 

## 2018-12-21 NOTE — Telephone Encounter (Signed)
Can you please find out when she saw them? I can not see that she has seen them since July 1019.

## 2018-12-21 NOTE — Telephone Encounter (Signed)
Patient is taking the Dicyclomine as needed with Nausea medication usually take 2 tablets when needed for total of 40 mg and the zofran 4 mg. This usually 3-4 times per week.

## 2018-12-21 NOTE — Telephone Encounter (Signed)
That's correct the last time she saw GI.

## 2018-12-21 NOTE — Telephone Encounter (Signed)
Patient says she has already seen GI and they are telling her this is normal.  Dr. [name] .

## 2018-12-21 NOTE — Telephone Encounter (Signed)
Could you give me [facility]'s NPI? Thanks

## 2018-12-21 NOTE — Telephone Encounter (Signed)
Pt is only taking this as needed

## 2018-12-22 NOTE — Telephone Encounter (Signed)
PA has been approved

## 2018-12-22 NOTE — Telephone Encounter (Signed)
Patient has  Been advised to Follow up with GI.

## 2018-12-23 ENCOUNTER — Other Ambulatory Visit: Payer: Self-pay | Admitting: Cardiovascular Disease

## 2018-12-23 ENCOUNTER — Other Ambulatory Visit: Payer: Self-pay | Admitting: Psychiatry

## 2018-12-23 ENCOUNTER — Other Ambulatory Visit: Payer: Self-pay | Admitting: Family Medicine

## 2018-12-23 DIAGNOSIS — E119 Type 2 diabetes mellitus without complications: Secondary | ICD-10-CM

## 2018-12-23 DIAGNOSIS — F250 Schizoaffective disorder, bipolar type: Secondary | ICD-10-CM

## 2019-01-01 IMAGING — CT CT ANGIO CHEST
1 of 6 series · 19 of 36 positions shown · IV contrast (APPLIED)
Comparison: Chest radiograph February 22, 2017 and CT chest [place]

CLINICAL DATA: Intermittent chest pain and wheezing for few days,
low-grade fever. Off blood thinners for colonoscopy. History of
pulmonary embolism.

EXAM:
CT ANGIOGRAPHY CHEST WITH CONTRAST
TECHNIQUE: Multidetector CT imaging of the chest was performed using the
standard protocol during bolus administration of intravenous
contrast. Multiplanar CT image reconstructions and MIPs were
obtained to evaluate the vascular anatomy.
CONTRAST:  75 cc Isovue 370

[Series 5: thins · axial · 0.68mm/px · z∈[-518,-266]mm · 19 of 281 slices shown]
[im 15/281  lung]
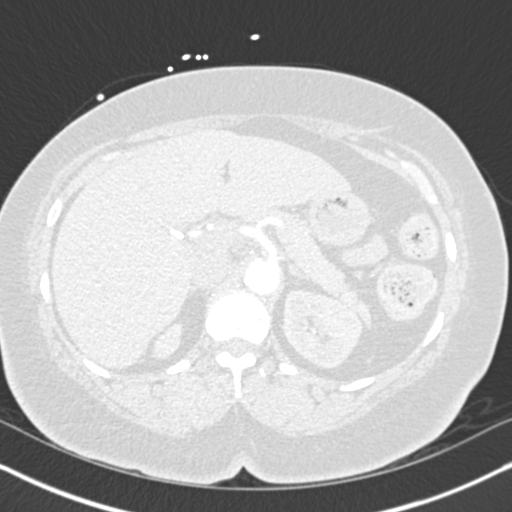
[im 29/281  mediastinal]
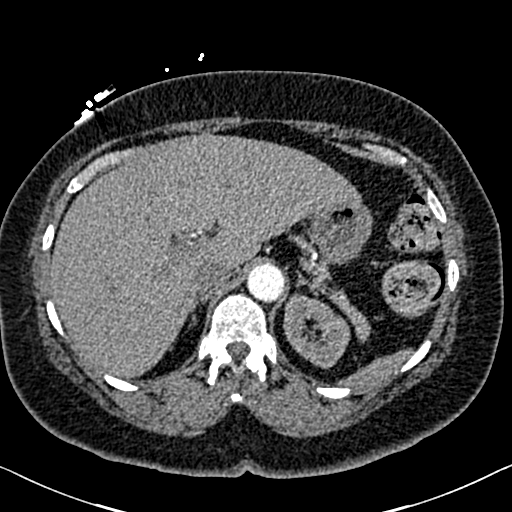
[im 43/281  lung]
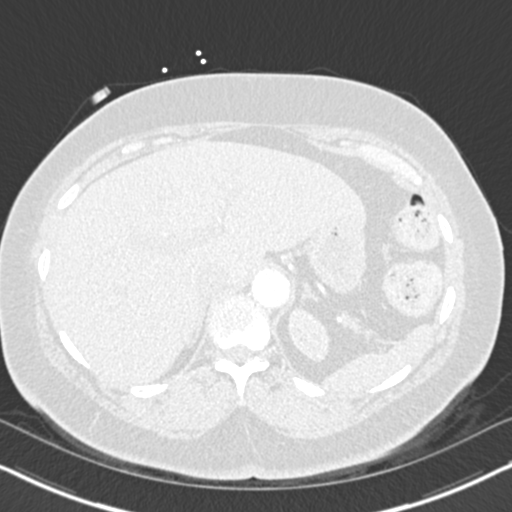
[im 57/281  mediastinal]
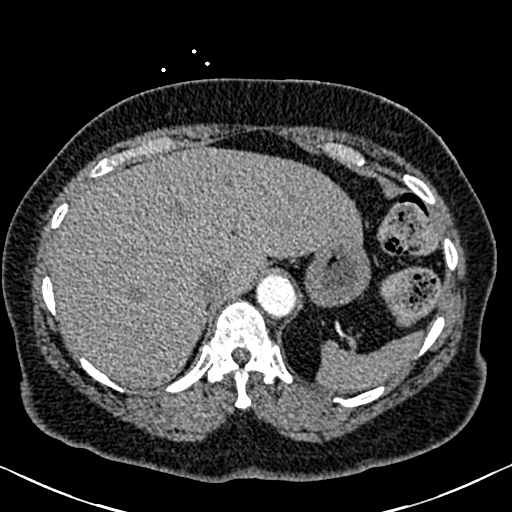
[im 71/281  lung]
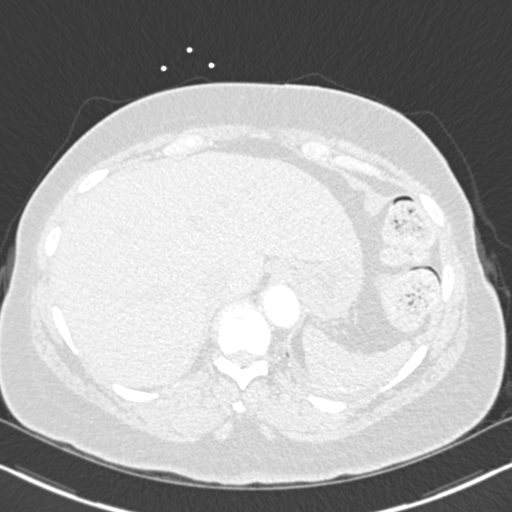
[im 85/281  mediastinal]
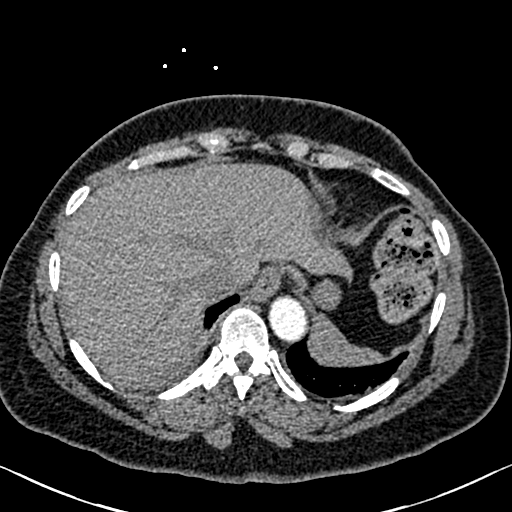
[im 99/281  lung]
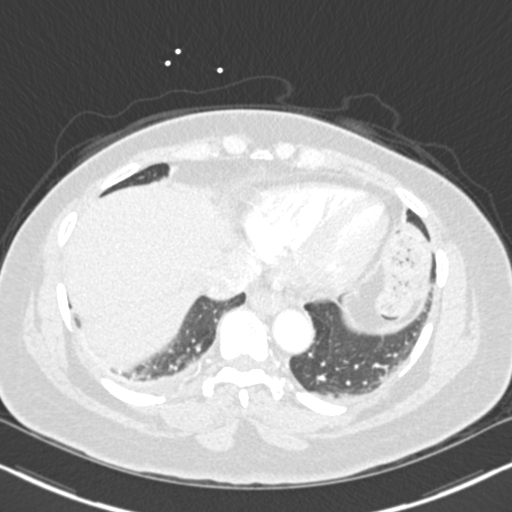
[im 113/281  mediastinal]
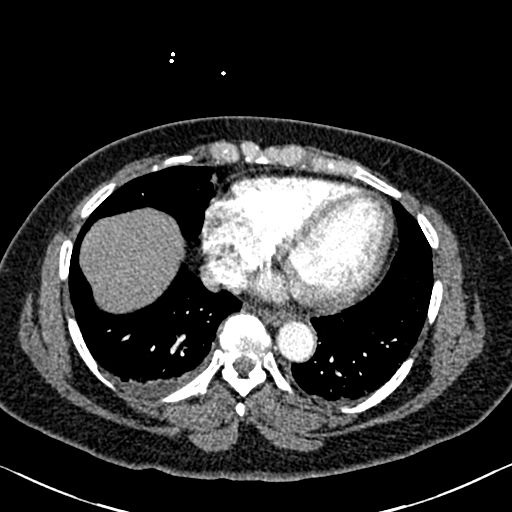
[im 127/281  lung]
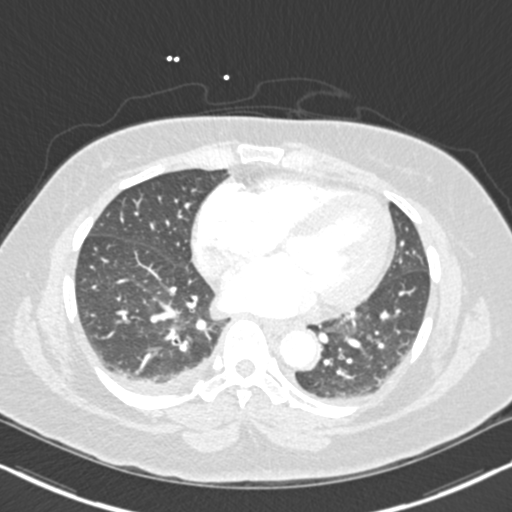
[im 141/281  mediastinal]
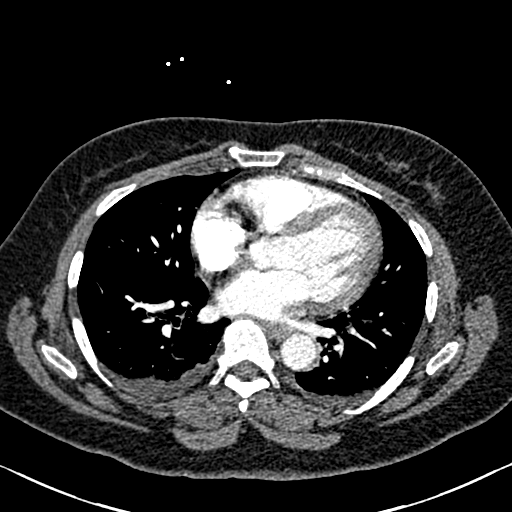
[im 155/281  lung]
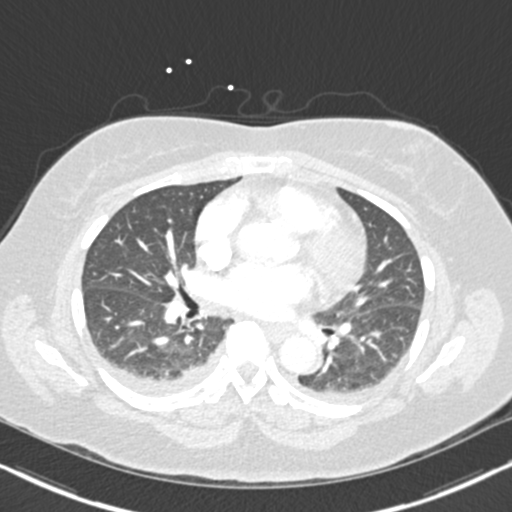
[im 169/281  mediastinal]
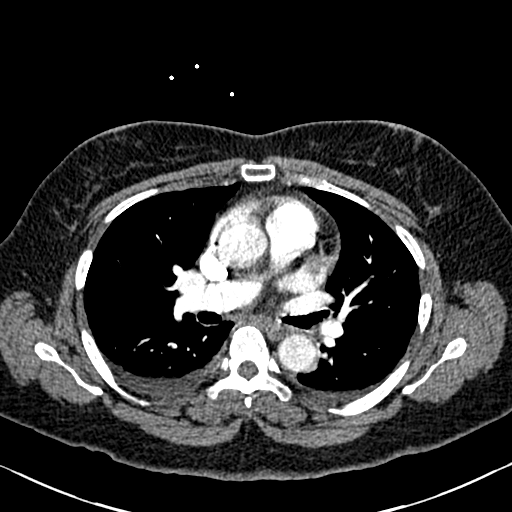
[im 183/281  lung]
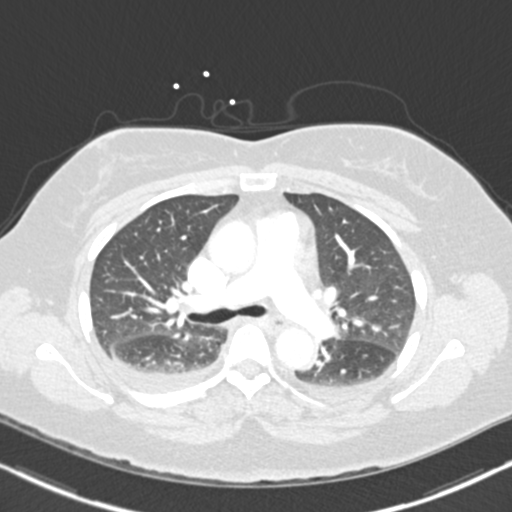
[im 197/281  mediastinal]
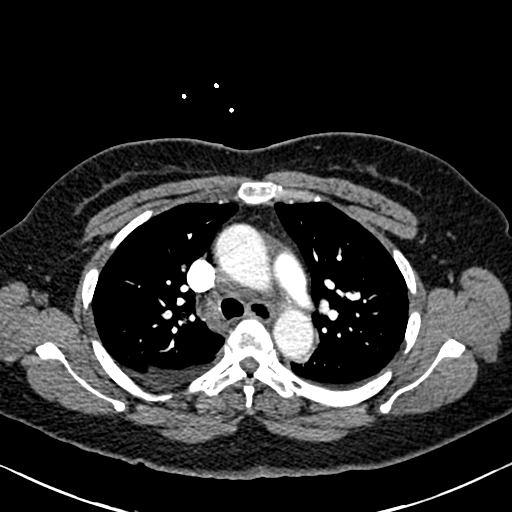
[im 211/281  lung]
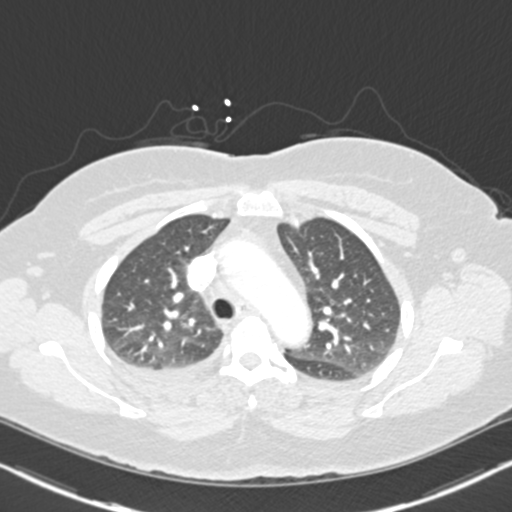
[im 225/281  mediastinal]
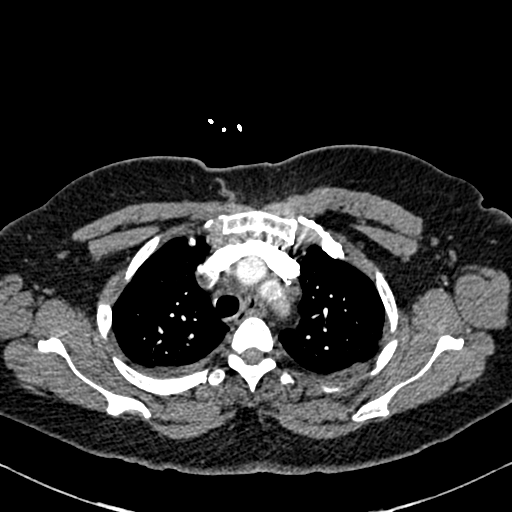
[im 239/281  lung]
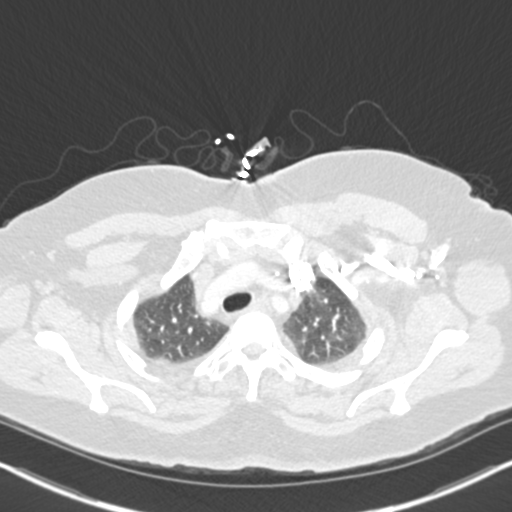
[im 253/281  mediastinal]
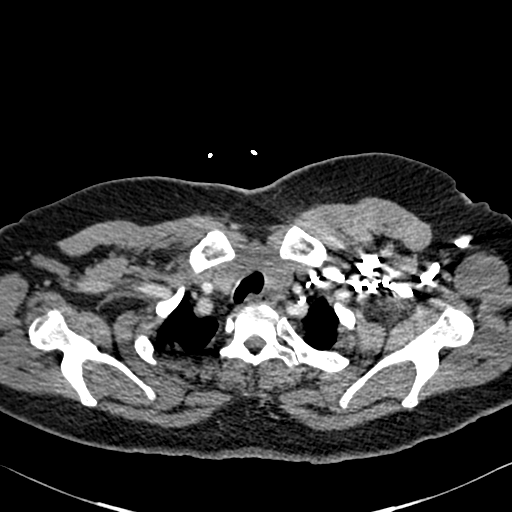
[im 267/281  lung]
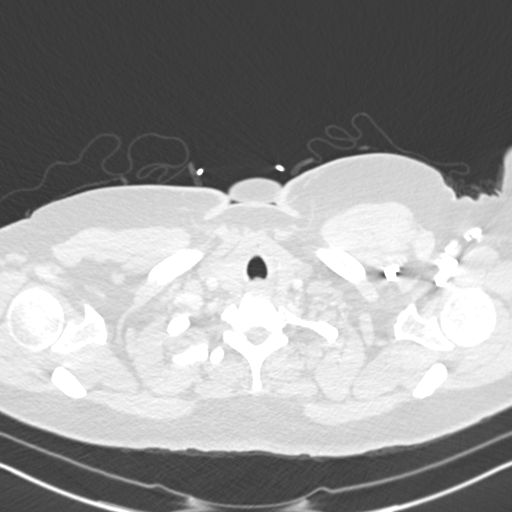

[19 of 36 positions shown; findings below may reference images not displayed]

FINDINGS: CARDIOVASCULAR: Adequate contrast opacification of the pulmonary
artery's. Main pulmonary artery is not enlarged. No pulmonary
arterial filling defects to the level of the subsegmental branches.
Heart size is normal, no right heart strain. No pericardial
effusion. Thoracic aorta is normal course and caliber, unremarkable.

MEDIASTINUM/NODES: No lymphadenopathy by CT size criteria.

LUNGS/PLEURA: Tracheobronchial tree is patent, no pneumothorax.
Small bilateral pleural effusions. Dependent atelectasis. No focal
consolidation. Mild heterogeneous lung attenuation. History

UPPER ABDOMEN: Status post cholecystectomy. No acute process in the
upper abdomen. Inferior vena cava filter noted on the localizer.

MUSCULOSKELETAL: Visualized soft tissues and included osseous
structures are nonacute. ACDF.

Review of the MIP images confirms the above findings.
IMPRESSION: No acute pulmonary embolism.

Small pleural effusions and dependent atelectasis. Heterogeneous
lung attenuation associated with small airway disease.

## 2019-01-05 ENCOUNTER — Other Ambulatory Visit: Payer: Self-pay | Admitting: Family Medicine

## 2019-01-05 DIAGNOSIS — M62838 Other muscle spasm: Secondary | ICD-10-CM

## 2019-01-05 NOTE — Telephone Encounter (Signed)
Last OV 12/09/2018 with Philis Nettle   Last refilled  cyclobenzaprine (FLEXERIL) 10 MG tablet 30 tablet 0 11/18/2018     Next OV 01/11/2019  Sent to PCP for approval

## 2019-01-07 ENCOUNTER — Telehealth: Payer: Self-pay

## 2019-01-07 NOTE — Telephone Encounter (Signed)
Copied from [place] [id number]. Topic: Appointment Scheduling - Scheduling Inquiry for Clinic >> Jan 06, 2019 11:38 AM Alanda Slim E wrote: Reason for CRM: Pt called to cancel or reschedule her appt. Called office several times but no answer  / please advise    Called patient and she cancelled her upcoming appt with her provider and stated she would call back to reschedule at a later date.  Maayan Jenning,cma

## 2019-01-11 ENCOUNTER — Ambulatory Visit: Payer: Self-pay | Admitting: Family Medicine

## 2019-01-15 DIAGNOSIS — Z87891 Personal history of nicotine dependence: Secondary | ICD-10-CM | POA: Diagnosis not present

## 2019-01-15 DIAGNOSIS — Z885 Allergy status to narcotic agent status: Secondary | ICD-10-CM | POA: Diagnosis not present

## 2019-01-15 DIAGNOSIS — M1990 Unspecified osteoarthritis, unspecified site: Secondary | ICD-10-CM | POA: Diagnosis not present

## 2019-01-15 DIAGNOSIS — K0889 Other specified disorders of teeth and supporting structures: Secondary | ICD-10-CM | POA: Diagnosis not present

## 2019-01-15 DIAGNOSIS — Z880 Allergy status to penicillin: Secondary | ICD-10-CM | POA: Diagnosis not present

## 2019-01-15 DIAGNOSIS — Z882 Allergy status to sulfonamides status: Secondary | ICD-10-CM | POA: Diagnosis not present

## 2019-01-15 DIAGNOSIS — I10 Essential (primary) hypertension: Secondary | ICD-10-CM | POA: Diagnosis not present

## 2019-01-15 DIAGNOSIS — E7800 Pure hypercholesterolemia, unspecified: Secondary | ICD-10-CM | POA: Diagnosis not present

## 2019-01-15 DIAGNOSIS — E039 Hypothyroidism, unspecified: Secondary | ICD-10-CM | POA: Diagnosis not present

## 2019-01-17 ENCOUNTER — Telehealth: Payer: Self-pay | Admitting: Family Medicine

## 2019-01-17 DIAGNOSIS — S025XXA Fracture of tooth (traumatic), initial encounter for closed fracture: Secondary | ICD-10-CM

## 2019-01-17 NOTE — Telephone Encounter (Signed)
I have placed a referral.  I will forward this to [place] to work on getting this set up for the patient.

## 2019-01-17 NOTE — Telephone Encounter (Signed)
Copied from [place] [id number]. Topic: Referral - Request for Referral >> Jan 17, 2019  1:19 PM Mathis Bud wrote: Has patient seen PCP for this complaint? No. Patient broke tooth  Referral for which specialty: Dental Clinic  Preferred provider/office: [facility], [address] [place], [place] [address] [phone] Reason for referral: Broke a tooth, tooth is split right now the middle.

## 2019-01-18 NOTE — Telephone Encounter (Signed)
Submitted to [facility] link and also faxed to number she provided.

## 2019-01-21 ENCOUNTER — Ambulatory Visit (INDEPENDENT_AMBULATORY_CARE_PROVIDER_SITE_OTHER): Payer: Medicare HMO | Admitting: Internal Medicine

## 2019-01-21 ENCOUNTER — Ambulatory Visit: Payer: Self-pay

## 2019-01-21 DIAGNOSIS — K0889 Other specified disorders of teeth and supporting structures: Secondary | ICD-10-CM

## 2019-01-21 MED ORDER — OXYCODONE-ACETAMINOPHEN 5-325 MG PO TABS: 1.0000 | ORAL_TABLET | Freq: Two times a day (BID) | ORAL | 0 refills | Status: DC | PRN

## 2019-01-21 NOTE — Telephone Encounter (Signed)
I spoke with patient & she is scheduled with Dr. Olivia Mackie for Doxy today at 3:30p.

## 2019-01-21 NOTE — Progress Notes (Signed)
telepphone Note  I connected with Valerie Vazquez   on 01/21/19 at  3:40 PM EDT by a telephone and verified that I am speaking with the correct person using two identifiers.  Location patient: home Location provider:work Persons participating in the virtual visit: patient, provider  I discussed the limitations of evaluation and management by telemedicine and the availability of in person appointments. The patient expressed understanding and agreed to proceed.   HPI: Pt was eating peanut brittle 01/12/2019 and chipped/broke the 1 of front teeth and canine tooth on the left.  She went to [facility] ED 01/15/2019 and was given lidocaine injection and clindamycin and Tylenol for pain which she started clindamycin on 01/16/2019. She is having dental pain she called [place] [facility] [phone] and the stated they will get back with her 01/25/2019.     ROS: See pertinent positives and negatives per HPI.  Past Medical History:  Diagnosis Date  . Acute right-sided low back pain with right-sided sciatica 10/23/2017  . Anemia   . Bilateral renal cysts   . CAD (coronary artery disease) 09/28/2016  . Chest pain 10/14/2015  . Chronic back pain    "upper and lower" (09/19/2014)  . Chronic shoulder pain (Location of Primary Source of Pain) (Left) 01/29/2016  . Colitis 04/08/2016  . Coronary vasospasm ([place]) 12/09/2014  . Depression    "major" (09/19/2014)  . DVT (deep venous thrombosis) ([place])   . Esophageal spasm   . Essential hypertension 10/28/2013  . Gallstones   . Gastritis   . Gastroenteritis   . GERD (gastroesophageal reflux disease)   . Headache    "couple /month lately" (09/19/2014)  . Heart disease 12/09/2016  . High blood pressure   . High cholesterol   . History of blood transfusion 1985   related to hysterectomy  . History of nuclear stress test    Myoview 1/19:  EF 65, no ischemia or scar  . History of pulmonary embolus (PE) 09/19/2014  . 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   Per patient diagnosed in  [place]. Rule out Paget's disease of the bone with blood work.   . Hyperlipidemia 10/28/2013  . Hypertension   . IBS (irritable bowel syndrome)   . Lumbar central spinal stenosis (moderate at L4-5; mild at L2-3 and L3-4) 01/29/2016  . Migraine    "a few times/yr" (09/19/2014)  . Myocardial infarction ([place]) 10/2010 X 3   "while hospitalized"  . Neck pain 01/29/2016  . Osteoarthritis    "qwhere; mostly around my joints" (09/19/2014)  . Pleural effusion, bilateral 07/31/2015  . Pneumonia 04/2014  . PTSD (post-traumatic stress disorder)   . Pulmonary embolism ([place]) 04/2001; 09/19/2014   "after gallbladder OR; "  . Schizoaffective disorder ([place])   . Schizoaffective disorder, depressive type () 08/28/2016  . Sleep apnea    "mild" (09/19/2014)  . Snoring    a. sleep study 5/16: No OSA  . SOB (shortness of breath) 07/31/2015  . Spinal stenosis   . Spondylosis   . Suicide attempt ([place]) 08/27/2016  . Type II diabetes mellitus ([place])    "dx'd in 2007; lost weight; no RX for ~ 4 yr now" (09/19/2014)  . Wheezing 02/25/2017    Past Surgical History:  Procedure Laterality Date  . ABDOMINAL HYSTERECTOMY  1985  . ANTERIOR CERVICAL DECOMP/DISCECTOMY FUSION  10/2012   in [place] C5-C7   . APPENDECTOMY  1985  . BACK SURGERY    . BREAST CYST EXCISION Right   . BREAST EXCISIONAL BIOPSY Right YRS  AGO   NEG  . CARDIAC CATHETERIZATION   2000's; 2009  . CHOLECYSTECTOMY  2002  . COLONOSCOPY WITH PROPOFOL N/A 12/12/2016   Procedure: COLONOSCOPY WITH PROPOFOL;  Surgeon: [name], MD;  Location[facility] ENDOSCOPY;  Service: Endoscopy;  Laterality: N/A;  . COLONOSCOPY WITH PROPOFOL N/A 02/17/2017   Procedure: COLONOSCOPY WITH PROPOFOL;  Surgeon: [name], MD;  Location[facility] ENDOSCOPY;  Service: Gastroenterology;  Laterality: N/A;  . ESOPHAGOGASTRODUODENOSCOPY (EGD) WITH PROPOFOL N/A 02/17/2017   Procedure: ESOPHAGOGASTRODUODENOSCOPY (EGD) WITH PROPOFOL;  Surgeon: [name], MD;  Location: [facility] ENDOSCOPY;   Service: Gastroenterology;  Laterality: N/A;  . EXCISION/RELEASE BURSA HIP Right 12/1984  . HERNIA REPAIR  1985  . LEFT HEART CATHETERIZATION WITH CORONARY ANGIOGRAM N/A 12/09/2014   Procedure: LEFT HEART CATHETERIZATION WITH CORONARY ANGIOGRAM;  Surgeon: Burnell Blanks, MD;  Location: [facility] CATH LAB;  Service: Cardiovascular;  Laterality: N/A;  . TONSILLECTOMY AND ADENOIDECTOMY  ~ 1968  . TOTAL HIP ARTHROPLASTY Left 04-06-2014  . UMBILICAL HERNIA REPAIR  1985  . VENA CAVA FILTER PLACEMENT  03/2014    Family History  Problem Relation Age of Onset  . Stroke Mother        Deceased  . Lung cancer Father        Deceased  . Healthy Daughter   . Healthy Son        x 2  . Other Son        Suicide  . Cancer Brother   . Heart attack Neg Hx     SOCIAL HX: lives at home   Current Outpatient Medications:  .  acetaminophen (TYLENOL) 500 MG tablet, Take 1 tablet (500 mg total) by mouth every 6 (six) hours as needed., Disp: 30 tablet, Rfl: 0 .  blood glucose meter kit and supplies KIT, Dispense based on patient and insurance preference. Use once daily as directed.  For ICD 10 code E11.9., Disp: 1 each, Rfl: 0 .  carvedilol (COREG) 3.125 MG tablet, Take 1 tablet (3.125 mg total) by mouth 2 (two) times daily with a meal., Disp: 60 tablet, Rfl: 11 .  cyclobenzaprine (FLEXERIL) 10 MG tablet, TAKE 1 TABLET 3 TIMES A DAY, Disp: 30 tablet, Rfl: 1 .  diclofenac sodium (VOLTAREN) 1 % GEL, Apply 2 g topically 4 (four) times daily. Right arm/shoulder arthritis, Disp: 1 Tube, Rfl: 2 .  dicyclomine (BENTYL) 20 MG tablet, TAKE 1 TABLET BY MOUTH 3 TIMES A DAY AS NEEDED FOR SPASMS, Disp: 270 tablet, Rfl: 0 .  diphenhydrAMINE (BENADRYL) 25 MG tablet, Take 1 tablet (25 mg total) by mouth every 6 (six) hours., Disp: 12 tablet, Rfl: 0 .  ELIQUIS 5 MG TABS tablet, TAKE 1 TABLET (5 MG TOTAL) BY MOUTH 2 (TWO) TIMES DAILY., Disp: 60 tablet, Rfl: 5 .  escitalopram (LEXAPRO) 20 MG tablet, Take 1 tablet (20 mg total)  by mouth daily., Disp: 90 tablet, Rfl: 1 .  gabapentin (NEURONTIN) 600 MG tablet, Take 1-1.5 tablets (600-900 mg total) by mouth 2 (two) times daily. Take 1 tablet in the AM and 1.5 tablet at bedtime, Disp: 225 tablet, Rfl: 1 .  isosorbide mononitrate (IMDUR) 30 MG 24 hr tablet, TAKE 1.5 TABLETS (45 MG TOTAL) BY MOUTH DAILY., Disp: 135 tablet, Rfl: 1 .  JANUVIA 100 MG tablet, TAKE 1 TABLET BY MOUTH EVERY DAY, Disp: 90 tablet, Rfl: 1 .  levothyroxine (SYNTHROID) 25 MCG tablet, TAKE 1 TABLET BY MOUTH BEFORE BREAKFAST 2-3 HOURS BEFORE TAKING ANY OTHER MEDICATION OR SUPPLEMENT, Disp: 90  tablet, Rfl: 0 .  lidocaine (LIDODERM) 5 %, Place 1 patch onto the skin daily. Remove & Discard patch within 12 hours., Disp: 30 patch, Rfl: 0 .  Multiple Vitamins-Minerals (SUPER THERA VITE M) TABS, Take by mouth., Disp: , Rfl:  .  NIFEdipine (PROCARDIA) 10 MG capsule, TAKE 1 CAPSULE BY MOUTH 2 TIMES DAILY. TAKE 30 MINS BEFORE MEALS UP TO 1X PER DAY, Disp: 180 capsule, Rfl: 1 .  nitroGLYCERIN (NITROSTAT) 0.4 MG SL tablet, Place 1 tablet (0.4 mg total) under the tongue every 5 (five) minutes as needed for chest pain., Disp: 25 tablet, Rfl: 6 .  OLANZapine (ZYPREXA) 10 MG tablet, Take 1 tablet (10 mg total) by mouth at bedtime., Disp: 90 tablet, Rfl: 0 .  ondansetron (ZOFRAN) 4 MG tablet, Take 1 tablet (4 mg total) by mouth 2 (two) times daily as needed for nausea or vomiting., Disp: 30 tablet, Rfl: 0 .  oxyCODONE-acetaminophen (PERCOCET) 5-325 MG tablet, Take 1 tablet by mouth 2 (two) times daily as needed for severe pain., Disp: 10 tablet, Rfl: 0 .  pantoprazole (PROTONIX) 40 MG tablet, TAKE 1 TABLET BY MOUTH EVERY DAY, Disp: 60 tablet, Rfl: 0 .  pravastatin (PRAVACHOL) 10 MG tablet, TAKE 1 TABLET BY MOUTH EVERYDAY AT BEDTIME, Disp: 90 tablet, Rfl: 1 .  ROZEREM 8 MG tablet, TAKE 1 TABLET (8 MG TOTAL) BY MOUTH AT BEDTIME., Disp: 90 tablet, Rfl: 2  EXAM:  VITALS per patient if applicable:  GENERAL: alert, oriented,  appears well and in no acute distress  HEENT: atraumatic, conjunttiva clear, no obvious abnormalities on inspection of external nose and ears  NECK: normal movements of the head and neck  LUNGS: on inspection no signs of respiratory distress, breathing rate appears normal, no obvious gross SOB, gasping or wheezing  CV: no obvious cyanosis  MS: moves all visible extremities without noticeable abnormality  PSYCH/NEURO: pleasant and cooperative, no obvious depression or anxiety, speech and thought processing grossly intact  ASSESSMENT AND PLAN:  Discussed the following assessment and plan:  Pain, dental - Plan: oxyCODONE-acetaminophen (PERCOCET) 5-325 MG tablet bid prn #10 no refills Continue clindamycin and f/u [facility] they are to call her back on 01/25/2019  Disc also Anbesol otc for pain prn      I discussed the assessment and treatment plan with the patient. The patient was provided an opportunity to ask questions and all were answered. The patient agreed with the plan and demonstrated an understanding of the instructions.   The patient was advised to call back or seek an in-person evaluation if the symptoms worsen or if the condition fails to improve as anticipated.  Time spent 10 minutes  Delorise Jackson, MD

## 2019-01-21 NOTE — Telephone Encounter (Signed)
Call  Triage  PCP is not here today  Does he feel he needs to be seen in urgent care today?

## 2019-01-21 NOTE — Telephone Encounter (Signed)
Incoming call from Patient  Who complains about a broken tooth .  Patient bite into some peanut brittle and broke straight in  Half .   On the 13th of April.  Was  Seen at [place]. Pain is 3 to 4.  Tetanus  Not current.  States just dont feel good.  Reviewed protocol with pt.  Reviewed care plan with Pt.    Reason for Disposition . Tooth is almost falling out  Answer Assessment - Initial Assessment Questions 1. MECHANISM: "How did the injury happen?"      Bite down on peanut brittle  2. ONSET: "When did the injury happen?" (e.g., minutes or hours ago)     April  13th 3. LOCATION: "What part of the tooth is injured?"      Near the front 4. APPEARANCE: "What does the tooth look like?"      Half in gums other half hanging down 5. BLEEDING: "Is the mouth still bleeding?" If so, ask: "Is it difficult to stop?"      Does bleed because Ot can taste it 6. PAIN: "Is it painful?" If so, ask: "How bad is the pain?"   (Scale 1-10; or mild, moderate, severe)     3 to4 7. TETANUS: For any breaks in the skin, ask: "When was the last tetanus booster?"    Not sure,  probaly not 8. OTHER SYMPTOMS: "Do you have any other symptoms?"      Just dont feel good 9. PREGNANCY: "Is there any chance you are pregnant?" "When was your last menstrual period?"     na  Protocols used: TOOTH INJURY-A-AH

## 2019-01-24 ENCOUNTER — Other Ambulatory Visit: Payer: Self-pay

## 2019-01-25 ENCOUNTER — Ambulatory Visit (INDEPENDENT_AMBULATORY_CARE_PROVIDER_SITE_OTHER): Payer: Medicare HMO

## 2019-01-25 ENCOUNTER — Ambulatory Visit (INDEPENDENT_AMBULATORY_CARE_PROVIDER_SITE_OTHER): Payer: Medicare HMO | Admitting: Family Medicine

## 2019-01-25 ENCOUNTER — Other Ambulatory Visit: Payer: Self-pay

## 2019-01-25 ENCOUNTER — Telehealth: Payer: Self-pay | Admitting: *Deleted

## 2019-01-25 ENCOUNTER — Encounter: Payer: Self-pay | Admitting: Family Medicine

## 2019-01-25 DIAGNOSIS — E119 Type 2 diabetes mellitus without complications: Secondary | ICD-10-CM

## 2019-01-25 DIAGNOSIS — K047 Periapical abscess without sinus: Secondary | ICD-10-CM | POA: Diagnosis not present

## 2019-01-25 DIAGNOSIS — J309 Allergic rhinitis, unspecified: Secondary | ICD-10-CM | POA: Diagnosis not present

## 2019-01-25 DIAGNOSIS — N83209 Unspecified ovarian cyst, unspecified side: Secondary | ICD-10-CM | POA: Diagnosis not present

## 2019-01-25 DIAGNOSIS — J90 Pleural effusion, not elsewhere classified: Secondary | ICD-10-CM

## 2019-01-25 DIAGNOSIS — Z0000 Encounter for general adult medical examination without abnormal findings: Secondary | ICD-10-CM | POA: Diagnosis not present

## 2019-01-25 DIAGNOSIS — R1013 Epigastric pain: Secondary | ICD-10-CM

## 2019-01-25 MED ORDER — FUROSEMIDE 20 MG PO TABS: 20.0000 mg | ORAL_TABLET | Freq: Every day | ORAL | 3 refills | Status: DC | PRN

## 2019-01-25 MED ORDER — POTASSIUM CHLORIDE CRYS ER 20 MEQ PO TBCR: 20.0000 meq | EXTENDED_RELEASE_TABLET | Freq: Every day | ORAL | 3 refills | Status: DC | PRN

## 2019-01-25 MED ORDER — CETIRIZINE HCL 10 MG PO TABS: 10.0000 mg | ORAL_TABLET | Freq: Every day | ORAL | 11 refills | Status: DC

## 2019-01-25 NOTE — Telephone Encounter (Signed)
Copied from [place] [phone]. Topic: General - Other >> Jan 25, 2019  4:05 PM Percell Belt A wrote: Reason for CRM: pt called in stated she was call in and let Dr Caryl Bis know how much of the furosemide she was taking and it was 20MG  .  And she is unsure of how much of th potassium she was taking, the label has rubbed off.

## 2019-01-25 NOTE — Patient Instructions (Addendum)
  Valerie Vazquez , Thank you for taking time to come for your Medicare Wellness Visit. I appreciate your ongoing commitment to your health goals. Please review the following plan we discussed and let me know if I can assist you in the future.   These are the goals we discussed: Goals      Patient Stated   . Increase physical activity (pt-stated)     Walk for exercise as tolerated       This is a list of the screening recommended for you and due dates:  Health Maintenance  Topic Date Due  . Eye exam for diabetics  04/02/1964  . Pap Smear  04/03/1975  . Mammogram  02/24/2018  . Tetanus Vaccine  09/29/2019*  . Hemoglobin A1C  03/01/2019  . Flu Shot  04/02/2019  . Complete foot exam   09/29/2019  . Urine Protein Check  10/01/2019  . Colon Cancer Screening  02/17/2022  .  Hepatitis C: One time screening is recommended by Center for Disease Control  (CDC) for  adults born from [birth year] through 1967.   Completed  . HIV Screening  Completed  *Topic was postponed. The date shown is not the original due date.

## 2019-01-25 NOTE — Progress Notes (Signed)
Virtual Visit via telephone Note  This visit type was conducted due to national recommendations for restrictions regarding the COVID-19 pandemic (e.g. social distancing).  This format is felt to be most appropriate for this patient at this time.  All issues noted in this document were discussed and addressed.  No physical exam was performed (except for noted visual exam findings with Video Visits).   I connected with Valerie Vazquez today at 10:30 AM EDT by telephone and verified that I am speaking with the correct person using two identifiers. Location patient: neighbor's house Location provider: work Persons participating in the virtual visit: patient, provider  I discussed the limitations, risks, security and privacy concerns of performing an evaluation and management service by telephone and the availability of in person appointments. I also discussed with the patient that there may be a patient responsible charge related to this service. The patient expressed understanding and agreed to proceed.  Interactive audio and video telecommunications were attempted between this provider and patient, however failed, due to patient having technical difficulties OR patient did not have access to video capability.  We continued and completed visit with audio only.   Reason for visit: Follow-up.  HPI: Dental pain: Patient notes this is improved.  Half of the tooth fell out and she has not had any significant pain since then.  She continues on clindamycin.  She notes no fevers.  She has had to take Percocet about once a day.  She is still waiting on hearing back from the dental clinic.  Diabetes: Not checking sugars.  She is taking Januvia.  No polyuria or polydipsia.  No hypoglycemia.  She is due to see ophthalmology.  Ovarian cyst: She has not seen gynecology yet.  Allergic rhinitis: Notes she typically has symptoms this time year.  She has some postnasal drip.  Some sneezing.  She has itchy watery  eyes.  No rhinorrhea.  Minimal cough.  No shortness of breath or fevers.  No COVID-19 exposure.  No travel.  She uses an over-the-counter Benadryl product and this controls her symptoms though does make her drowsy.  Chronic abdominal pain: Patient notes she has chronic intermittent abdominal discomfort in the lower and upper abdomen.  Occasionally is sharp.  Other times it feels like gas.  She has seen GI for this and notes she was advised she has IBS.  She does have some nausea.  She has a bowel movement about twice a week.   ROS: See pertinent positives and negatives per HPI.  Past Medical History:  Diagnosis Date   Acute right-sided low back pain with right-sided sciatica 10/23/2017   Anemia    Bilateral renal cysts    CAD (coronary artery disease) 09/28/2016   Chest pain 10/14/2015   Chronic back pain    "upper and lower" (09/19/2014)   Chronic shoulder pain (Location of Primary Source of Pain) (Left) 01/29/2016   Colitis 04/08/2016   Coronary vasospasm ([place]) 12/09/2014   Depression    "major" (09/19/2014)   DVT (deep venous thrombosis) (H[facility])    Esophageal spasm    Essential hypertension 10/28/2013   Gallstones    Gastritis    Gastroenteritis    GERD (gastroesophageal reflux disease)    Headache    "couple /month lately" (09/19/2014)   Heart disease 12/09/2016   High blood pressure    High cholesterol    History of blood transfusion 1985   related to hysterectomy   History of nuclear stress test  Myoview 1/19:  EF 65, no ischemia or scar   History of pulmonary embolus (PE) 09/19/2014   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 Rule out Paget's disease of the bone with blood work.    Hyperlipidemia 10/28/2013   Hypertension    IBS (irritable bowel syndrome)    Lumbar central spinal stenosis (moderate at L4-5; mild at L2-3 and L3-4) 01/29/2016   Migraine    "a few times/yr" (09/19/2014)   Myocardial infarction ([place]) 10/2010 X 3    "while hospitalized"   Neck pain 01/29/2016   Osteoarthritis    "qwhere; mostly around my joints" (09/19/2014)   Pleural effusion, bilateral 07/31/2015   Pneumonia 04/2014   PTSD (post-traumatic stress disorder)    Pulmonary embolism ([place]) 04/2001; 09/19/2014   "after gallbladder OR; "   Schizoaffective disorder ([place])    Schizoaffective disorder, depressive type ([place]) 08/28/2016   Sleep apnea    "mild" (09/19/2014)   Snoring    a. sleep study 5/16: No OSA   SOB (shortness of breath) 07/31/2015   Spinal stenosis    Spondylosis    Suicide attempt ([place]) 08/27/2016   Type II diabetes mellitus ([place])    "dx'd in 2007; lost weight; no RX for ~ 4 yr now" (09/19/2014)   Wheezing 02/25/2017    Past Surgical History:  Procedure Laterality Date   [place] DECOMP/DISCECTOMY FUSION  10/2012   in [place] C5-C7    [place] Right    BREAST EXCISIONAL BIOPSY Right YRS AGO   NEG   CARDIAC CATHETERIZATION   2000's; 2009   CHOLECYSTECTOMY  2002   COLONOSCOPY WITH PROPOFOL N/A 12/12/2016   Procedure: COLONOSCOPY WITH PROPOFOL;  Surgeon: [name], MD;  Location: [facility] ENDOSCOPY;  Service: Endoscopy;  Laterality: N/A;   COLONOSCOPY WITH PROPOFOL N/A 02/17/2017   Procedure: COLONOSCOPY WITH PROPOFOL;  Surgeon: [name], MD;  Location: [facility] ENDOSCOPY;  Service: Gastroenterology;  Laterality: N/A;   ESOPHAGOGASTRODUODENOSCOPY (EGD) WITH PROPOFOL N/A 02/17/2017   Procedure: ESOPHAGOGASTRODUODENOSCOPY (EGD) WITH PROPOFOL;  Surgeon: [name], MD;  Location: [facility] ENDOSCOPY;  Service: Gastroenterology;  Laterality: N/A;   EXCISION/RELEASE BURSA HIP Right 12/1984   HERNIA REPAIR  1985   LEFT HEART CATHETERIZATION WITH CORONARY ANGIOGRAM N/A 12/09/2014   Procedure: LEFT HEART CATHETERIZATION WITH CORONARY ANGIOGRAM;  Surgeon: Burnell Blanks, MD;  Location: [facility] CATH LAB;  Service:  Cardiovascular;  Laterality: N/A;   TONSILLECTOMY AND ADENOIDECTOMY  ~ 1968   TOTAL HIP ARTHROPLASTY Left [id number]   UMBILICAL HERNIA REPAIR  1985   VENA CAVA FILTER PLACEMENT  03/2014    Family History  Problem Relation Age of Onset   Stroke Mother        Deceased   Lung cancer Father        Deceased   Healthy Daughter    Healthy Son        x 2   Other Son        Suicide   Cancer Brother    Heart attack Neg Hx     SOCIAL HX: Former smoker   Current Outpatient Medications:    acetaminophen (TYLENOL) 500 MG tablet, Take 1 tablet (500 mg total) by mouth every 6 (six) hours as needed., Disp: 30 tablet, Rfl: 0   carvedilol (COREG) 3.125 MG tablet, Take 1 tablet (3.125 mg total) by mouth 2 (two) times daily with a meal.,  Disp: 60 tablet, Rfl: 11   cyclobenzaprine (FLEXERIL) 10 MG tablet, TAKE 1 TABLET 3 TIMES A DAY, Disp: 30 tablet, Rfl: 1   dicyclomine (BENTYL) 20 MG tablet, TAKE 1 TABLET BY MOUTH 3 TIMES A DAY AS NEEDED FOR SPASMS, Disp: 270 tablet, Rfl: 0   diphenhydrAMINE (BENADRYL) 25 MG tablet, Take 1 tablet (25 mg total) by mouth every 6 (six) hours., Disp: 12 tablet, Rfl: 0   ELIQUIS 5 MG TABS tablet, TAKE 1 TABLET (5 MG TOTAL) BY MOUTH 2 (TWO) TIMES DAILY., Disp: 60 tablet, Rfl: 5   escitalopram (LEXAPRO) 20 MG tablet, Take 1 tablet (20 mg total) by mouth daily., Disp: 90 tablet, Rfl: 1   gabapentin (NEURONTIN) 600 MG tablet, Take 1-1.5 tablets (600-900 mg total) by mouth 2 (two) times daily. Take 1 tablet in the AM and 1.5 tablet at bedtime, Disp: 225 tablet, Rfl: 1   isosorbide mononitrate (IMDUR) 30 MG 24 hr tablet, TAKE 1.5 TABLETS (45 MG TOTAL) BY MOUTH DAILY., Disp: 135 tablet, Rfl: 1   JANUVIA 100 MG tablet, TAKE 1 TABLET BY MOUTH EVERY DAY, Disp: 90 tablet, Rfl: 1   levothyroxine (SYNTHROID) 25 MCG tablet, TAKE 1 TABLET BY MOUTH BEFORE BREAKFAST 2-3 HOURS BEFORE TAKING ANY OTHER MEDICATION OR SUPPLEMENT, Disp: 90 tablet, Rfl: 0   lidocaine  (LIDODERM) 5 %, Place 1 patch onto the skin daily. Remove & Discard patch within 12 hours., Disp: 30 patch, Rfl: 0   Multiple Vitamins-Minerals (SUPER THERA VITE M) TABS, Take by mouth., Disp: , Rfl:    NIFEdipine (PROCARDIA) 10 MG capsule, TAKE 1 CAPSULE BY MOUTH 2 TIMES DAILY. TAKE 30 MINS BEFORE MEALS UP TO 1X PER DAY, Disp: 180 capsule, Rfl: 1   OLANZapine (ZYPREXA) 10 MG tablet, Take 1 tablet (10 mg total) by mouth at bedtime., Disp: 90 tablet, Rfl: 0   ondansetron (ZOFRAN) 4 MG tablet, Take 1 tablet (4 mg total) by mouth 2 (two) times daily as needed for nausea or vomiting., Disp: 30 tablet, Rfl: 0   oxyCODONE-acetaminophen (PERCOCET) 5-325 MG tablet, Take 1 tablet by mouth 2 (two) times daily as needed for severe pain., Disp: 10 tablet, Rfl: 0   pantoprazole (PROTONIX) 40 MG tablet, TAKE 1 TABLET BY MOUTH EVERY DAY, Disp: 60 tablet, Rfl: 0   pravastatin (PRAVACHOL) 10 MG tablet, TAKE 1 TABLET BY MOUTH EVERYDAY AT BEDTIME, Disp: 90 tablet, Rfl: 1   cetirizine (ZYRTEC) 10 MG tablet, Take 1 tablet (10 mg total) by mouth daily., Disp: 30 tablet, Rfl: 11   diclofenac sodium (VOLTAREN) 1 % GEL, Apply 2 g topically 4 (four) times daily. Right arm/shoulder arthritis (Patient not taking: Reported on 01/25/2019), Disp: 1 Tube, Rfl: 2   furosemide (LASIX) 20 MG tablet, Take 1 tablet (20 mg total) by mouth daily as needed for edema., Disp: 30 tablet, Rfl: 3   nitroGLYCERIN (NITROSTAT) 0.4 MG SL tablet, Place 1 tablet (0.4 mg total) under the tongue every 5 (five) minutes as needed for chest pain., Disp: 25 tablet, Rfl: 6   potassium chloride SA (K-DUR) 20 MEQ tablet, Take 1 tablet (20 mEq total) by mouth daily as needed (take with lasix)., Disp: 30 tablet, Rfl: 3  EXAM: This is a telehealth telephone visit and thus no physical exam was completed.  ASSESSMENT AND PLAN:  Discussed the following assessment and plan:  Dental infection  Cyst of ovary, unspecified laterality - Plan: Ambulatory  referral to Gynecology  Diabetes mellitus without complication ([place]) - Plan: Hemoglobin A1c, Basic  metabolic panel  Epigastric discomfort - Plan: Ambulatory referral to Gastroenterology  Pleural effusion, bilateral  Allergic rhinitis, unspecified seasonality, unspecified trigger  Dental infection She will complete her course of antibiotics.  Advised her to contact the dental clinic if she has not heard anything by the end of the day.  Ovarian cyst I will place another referral to gynecology.  Diabetes mellitus without complication ([place]) She will be scheduled for lab work.  Continue Januvia.  Epigastric discomfort Chronic intermittent issues with abdominal pain.  Prior imaging with no obvious cause.  Will refer back to GI.  Pleural effusion, bilateral Patient saw pulmonology for these.  They felt her pleural effusions were likely representative of diastolic heart failure.  No further intervention was recommended.  She does take occasional Lasix for lower extremity edema and she will continue this.  Could consider increasing frequency if needed.  Allergic rhinitis Trial of Zyrtec.  CMA will contact patient to get her scheduled for labs and follow-up.  Social distancing precautions and sick precautions given regarding COVID-19.   I discussed the assessment and treatment plan with the patient. The patient was provided an opportunity to ask questions and all were answered. The patient agreed with the plan and demonstrated an understanding of the instructions.   The patient was advised to call back or seek an in-person evaluation if the symptoms worsen or if the condition fails to improve as anticipated.  I provided 27 minutes of non-face-to-face time during this encounter.   [name], MD

## 2019-01-25 NOTE — Telephone Encounter (Signed)
Noted.  This has been sent to her pharmacy.

## 2019-01-25 NOTE — Progress Notes (Signed)
Subjective:   Valerie Vazquez is a 63 y.o. female who presents for Medicare Annual (Subsequent) preventive examination.  Review of Systems:  No ROS.  Medicare Wellness Virtual Visit.  Visual/audio telehealth visit, UTA vital signs.   See social history for additional risk factors. Cardiac Risk Factors include: diabetes mellitus;hypertension    Objective:     Vitals: There were no vitals taken for this visit.  There is no height or weight on file to calculate BMI.  Advanced Directives 09/29/2018 07/17/2018 01/22/2018 06/24/2017 03/28/2017 02/22/2017 02/17/2017  Does Patient Have a Medical Advance Directive? No No No No No No No  Would patient like information on creating a medical advance directive? No - Patient declined - No - Patient declined - - - -    Tobacco Social History   Tobacco Use  Smoking Status Former Smoker  . Packs/day: 1.50  . Years: 10.00  . Pack years: 15.00  . Types: Cigarettes  . Last attempt to quit: 04/12/1979  . Years since quitting: 39.8  Smokeless Tobacco Never Used     Counseling given: Not Answered   Clinical Intake:  Pre-visit preparation completed: Yes        Diabetes: No  How often do you need to have someone help you when you read instructions, pamphlets, or other written materials from your doctor or pharmacy?: 1 - Never  Interpreter Needed?: No     Past Medical History:  Diagnosis Date  . Acute right-sided low back pain with right-sided sciatica 10/23/2017  . Anemia   . Bilateral renal cysts   . CAD (coronary artery disease) 09/28/2016  . Chest pain 10/14/2015  . Chronic back pain    "upper and lower" (09/19/2014)  . Chronic shoulder pain (Location of Primary Source of Pain) (Left) 01/29/2016  . Colitis 04/08/2016  . Coronary vasospasm ([place]) 12/09/2014  . Depression    "major" (09/19/2014)  . DVT (deep venous thrombosis) ([place])   . Esophageal spasm   . Essential hypertension 10/28/2013  . Gallstones   . Gastritis   .  Gastroenteritis   . GERD (gastroesophageal reflux disease)   . Headache    "couple /month lately" (09/19/2014)  . Heart disease 12/09/2016  . High blood pressure   . High cholesterol   . History of blood transfusion 1985   related to hysterectomy  . History of nuclear stress test    Myoview 1/19:  EF 65, no ischemia or scar  . History of pulmonary embolus (PE) 09/19/2014  . 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 Rule out Paget's disease of the bone with blood work.   . Hyperlipidemia 10/28/2013  . Hypertension   . IBS (irritable bowel syndrome)   . Lumbar central spinal stenosis (moderate at L4-5; mild at L2-3 and L3-4) 01/29/2016  . Migraine    "a few times/yr" (09/19/2014)  . Myocardial infarction ([place]) 10/2010 X 3   "while hospitalized"  . Neck pain 01/29/2016  . Osteoarthritis    "qwhere; mostly around my joints" (09/19/2014)  . Pleural effusion, bilateral 07/31/2015  . Pneumonia 04/2014  . PTSD (post-traumatic stress disorder)   . Pulmonary embolism ([place]) 04/2001; 09/19/2014   "after gallbladder OR; "  . Schizoaffective disorder ([place])   . Schizoaffective disorder, depressive type ([place]) 08/28/2016  . Sleep apnea    "mild" (09/19/2014)  . Snoring    a. sleep study 5/16: No OSA  . SOB (shortness of breath) 07/31/2015  . Spinal stenosis   . Spondylosis   .  Suicide attempt ([place]) 08/27/2016  . Type II diabetes mellitus ([place])    "dx'd in 2007; lost weight; no RX for ~ 4 yr now" (09/19/2014)  . Wheezing 02/25/2017   Past Surgical History:  Procedure Laterality Date  . ABDOMINAL HYSTERECTOMY  1985  . ANTERIOR CERVICAL DECOMP/DISCECTOMY FUSION  10/2012   in [place] C5-C7   . APPENDECTOMY  1985  . BACK SURGERY    . BREAST CYST EXCISION Right   . BREAST EXCISIONAL BIOPSY Right YRS AGO   NEG  . CARDIAC CATHETERIZATION   2000's; 2009  . CHOLECYSTECTOMY  2002  . COLONOSCOPY WITH PROPOFOL N/A 12/12/2016   Procedure: COLONOSCOPY WITH PROPOFOL;  Surgeon: [name], MD;  Location[facility] END[facility]COPY;  Service: Endoscopy;  Laterality: N/A;  . COLONOSCOPY WITH PROPOFOL N/A 02/17/2017   Procedure: COLONOSCOPY WITH PROPOFOL;  Surgeon: [name], MD;  Location: [facility] ENDOSCOPY;  Service: Gastroenterology;  Laterality: N/A;  . ESOPHAGOGASTRODUODENOSCOPY (EGD) WITH PROPOFOL N/A 02/17/2017   Procedure: ESOPHAGOGASTRODUODENOSCOPY (EGD) WITH PROPOFOL;  Surgeon: [name], MD;  Location: [facility] ENDOSCOPY;  Service: Gastroenterology;  Laterality: N/A;  . EXCISION/RELEASE BURSA HIP Right 12/1984  . HERNIA REPAIR  1985  . LEFT HEART CATHETERIZATION WITH CORONARY ANGIOGRAM N/A 12/09/2014   Procedure: LEFT HEART CATHETERIZATION WITH CORONARY ANGIOGRAM;  Surgeon: Burnell Blanks, MD;  Location: [facility] CATH LAB;  Service: Cardiovascular;  Laterality: N/A;  . TONSILLECTOMY AND ADENOIDECTOMY  ~ 1968  . TOTAL HIP ARTHROPLASTY Left 04-06-2014  . UMBILICAL HERNIA REPAIR  1985  . VENA CAVA FILTER PLACEMENT  03/2014   Family History  Problem Relation Age of Onset  . Stroke Mother        Deceased  . Lung cancer Father        Deceased  . Healthy Daughter   . Healthy Son        x 2  . Other Son        Suicide  . Cancer Brother   . Heart attack Neg Hx    Social History   Socioeconomic History  . Marital status: Divorced    Spouse name: Not on file  . Number of children: Not on file  . Years of education: Not on file  . Highest education level: Not on file  Occupational History  . Not on file  Social Needs  . Financial resource strain: Not hard at all  . Food insecurity:    Worry: Never true    Inability: Never true  . Transportation needs:    Medical: No    Non-medical: No  Tobacco Use  . Smoking status: Former Smoker    Packs/day: 1.50    Years: 10.00    Pack years: 15.00    Types: Cigarettes    Last attempt to quit: 04/12/1979    Years since quitting: 39.8  . Smokeless tobacco: Never Used  Substance and Sexual Activity  . Alcohol use: No     Alcohol/week: 0.0 standard drinks  . Drug use: No  . Sexual activity: Not Currently  Lifestyle  . Physical activity:    Days per week: 0 days    Minutes per session: Not on file  . Stress: Not on file  Relationships  . Social connections:    Talks on phone: Not on file    Gets together: Not on file    Attends religious service: Not on file    Active member of club or organization: Not on file    Attends meetings of clubs  or organizations: Not on file    Relationship status: Not on file  Other Topics Concern  . Not on file  Social History Narrative   Lives with fiancee in an apartment on the first floor.  Has 3 grown children.  One child passed away.  On disability 1994-11-30 - 2000, 2007 - current.     Education: some college.    Outpatient Encounter Medications as of 01/25/2019  Medication Sig  . acetaminophen (TYLENOL) 500 MG tablet Take 1 tablet (500 mg total) by mouth every 6 (six) hours as needed.  . carvedilol (COREG) 3.125 MG tablet Take 1 tablet (3.125 mg total) by mouth 2 (two) times daily with a meal.  . cetirizine (ZYRTEC) 10 MG tablet Take 1 tablet (10 mg total) by mouth daily.  . cyclobenzaprine (FLEXERIL) 10 MG tablet TAKE 1 TABLET 3 TIMES A DAY  . diclofenac sodium (VOLTAREN) 1 % GEL Apply 2 g topically 4 (four) times daily. Right arm/shoulder arthritis (Patient not taking: Reported on 01/25/2019)  . dicyclomine (BENTYL) 20 MG tablet TAKE 1 TABLET BY MOUTH 3 TIMES A DAY AS NEEDED FOR SPASMS  . diphenhydrAMINE (BENADRYL) 25 MG tablet Take 1 tablet (25 mg total) by mouth every 6 (six) hours.  Marland Kitchen ELIQUIS 5 MG TABS tablet TAKE 1 TABLET (5 MG TOTAL) BY MOUTH 2 (TWO) TIMES DAILY.  Marland Kitchen escitalopram (LEXAPRO) 20 MG tablet Take 1 tablet (20 mg total) by mouth daily.  Marland Kitchen gabapentin (NEURONTIN) 600 MG tablet Take 1-1.5 tablets (600-900 mg total) by mouth 2 (two) times daily. Take 1 tablet in the AM and 1.5 tablet at bedtime  . isosorbide mononitrate (IMDUR) 30 MG 24 hr tablet TAKE 1.5  TABLETS (45 MG TOTAL) BY MOUTH DAILY.  Marland Kitchen JANUVIA 100 MG tablet TAKE 1 TABLET BY MOUTH EVERY DAY  . levothyroxine (SYNTHROID) 25 MCG tablet TAKE 1 TABLET BY MOUTH BEFORE BREAKFAST 2-3 HOURS BEFORE TAKING ANY OTHER MEDICATION OR SUPPLEMENT  . lidocaine (LIDODERM) 5 % Place 1 patch onto the skin daily. Remove & Discard patch within 12 hours.  . Multiple Vitamins-Minerals (SUPER THERA VITE M) TABS Take by mouth.  Marland Kitchen NIFEdipine (PROCARDIA) 10 MG capsule TAKE 1 CAPSULE BY MOUTH 2 TIMES DAILY. TAKE 30 MINS BEFORE MEALS UP TO 1X PER DAY  . nitroGLYCERIN (NITROSTAT) 0.4 MG SL tablet Place 1 tablet (0.4 mg total) under the tongue every 5 (five) minutes as needed for chest pain.  Marland Kitchen OLANZapine (ZYPREXA) 10 MG tablet Take 1 tablet (10 mg total) by mouth at bedtime.  . ondansetron (ZOFRAN) 4 MG tablet Take 1 tablet (4 mg total) by mouth 2 (two) times daily as needed for nausea or vomiting.  Marland Kitchen oxyCODONE-acetaminophen (PERCOCET) 5-325 MG tablet Take 1 tablet by mouth 2 (two) times daily as needed for severe pain.  . pantoprazole (PROTONIX) 40 MG tablet TAKE 1 TABLET BY MOUTH EVERY DAY  . pravastatin (PRAVACHOL) 10 MG tablet TAKE 1 TABLET BY MOUTH EVERYDAY AT BEDTIME   No facility-administered encounter medications on file as of 01/25/2019.     Activities of Daily Living In your present state of health, do you have any difficulty performing the following activities: 01/25/2019  Hearing? N  Vision? N  Difficulty concentrating or making decisions? (No Data)  Comment Patient notes she has difficulty remembering names  Walking or climbing stairs? Y  Dressing or bathing? N  Doing errands, shopping? N  Preparing Food and eating ? N  Using the Toilet? N  In the past six  months, have you accidently leaked urine? N  Do you have problems with loss of bowel control? N  Managing your Medications? N  Managing your Finances? N  Housekeeping or managing your Housekeeping? N  Some recent data might be hidden     Patient Care Team: [name], MD as PCP - General (Family Medicine) Burnell Blanks, MD as PCP - Cardiology (Cardiology)    Assessment:   This is a routine wellness examination for Aparna.  I connected with patient 01/25/19 at 11:00 AM EDT by a audio enabled telemedicine application and verified that I am speaking with the correct person using two identifiers. Patient stated full name and DOB. Patient gave permission to continue with virtual visit. Patient's location was at home and Nurse's location was at [place] office.   Health Screenings  Mammogram - 01/2016; ordered by pcp Colonoscopy - 01/2017 Glaucoma -none Hearing -demonstrates normal hearing during visit. Hemoglobin A1C - 08/2018 Labs followed by pcp Dental- [facility]; she plans to schedule with local dental office. [place]; she plans to schedule  Social  Alcohol intake - no     Smoking history- former    Smokers in home? none Illicit drug use? none Exercise - walking as tolerated Diet - regular Sexually Active -not currently BMI- discussed the importance of a healthy diet, water intake and the benefits of aerobic exercise.  Educational material provided.   Safety  Patient feels safe at home- yes Patient does have smoke detectors at home- yes Patient does wear sunscreen or protective clothing when in direct sunlight -yes Patient does wear seat belt when in a moving vehicle -yes  Covid-19 precautions and sickness symptoms discussed.   Activities of Daily Living Patient denies needing assistance with: driving, household chores, feeding themselves, getting from bed to chair, getting to the toilet, bathing/showering, dressing, managing money, or preparing meals.   Depression Screen Patient denies losing interest in daily life, feeling hopeless, or crying easily over simple problems.   Medication-taking as directed and without issues.   Fall Screen Patient denies being  afraid of falling. No falls sine the last 1 year ago.    Memory Screen Patient is alert.  Patient denies difficulty focusing, concentrating or misplacing items. Correctly identified the president of the [place], season and recall. Patient likes to read a little for brain stimulation.  Immunizations The following Immunizations were discussed: Influenza, shingles, pneumonia, and tetanus.   Other Providers Patient Care Team: [name], MD as PCP - General (Family Medicine) Burnell Blanks, MD as PCP - Cardiology (Cardiology)  Exercise Activities and Dietary recommendations Current Exercise Habits: The patient does not participate in regular exercise at present  Goals      Patient Stated   . Increase physical activity (pt-stated)     Walk for exercise as tolerated       Fall Risk Fall Risk  01/25/2019 04/08/2018 01/22/2018 11/05/2017 01/20/2017  Falls in the past year? 0 No Yes No Yes  Number falls in past yr: - - 2 or more - 1  Injury with Fall? - - No - Yes  Comment - - - - States she hurt L knee 3 weeks ago due to missing a step.  She did not seek medical attention.  Risk Factor Category  - - - - -  Risk for fall due to : - - History of fall(s) - -  Follow up - - - - Falls prevention discussed;Education provided  Depression Screen PHQ 2/9 Scores 08/30/2018 04/08/2018 01/22/2018 11/05/2017  PHQ - 2 Score 0 0 0 0  PHQ- 9 Score - - - -     Cognitive Function MMSE - Mini Mental State Exam 01/22/2018 01/20/2017  Orientation to time 5 5  Orientation to Place 5 5  Registration 3 3  Attention/ Calculation 5 5  Recall 2 0  Language- name 2 objects 2 2  Language- repeat 1 1  Language- follow 3 step command 3 3  Language- read & follow direction 1 1  Write a sentence 1 1  Copy design 1 1  Total score 29 27     6CIT Screen 01/25/2019 01/20/2017  What Year? 0 points 0 points  What month? 0 points 0 points  What time? 0 points 0 points  Count back from 20 0 points 0  points  Months in reverse 0 points 0 points  Repeat phrase 0 points 10 points  Total Score 0 10     There is no immunization history on file for this patient.  Screening Tests Health Maintenance  Topic Date Due  . OPHTHALMOLOGY EXAM  04/02/1964  . PAP SMEAR-Modifier  04/03/1975  . MAMMOGRAM  02/24/2018  . TETANUS/TDAP  09/29/2019 (Originally 04/02/1973)  . HEMOGLOBIN A1C  03/01/2019  . INFLUENZA VACCINE  04/02/2019  . FOOT EXAM  09/29/2019  . URINE MICROALBUMIN  10/01/2019  . COLONOSCOPY  02/17/2022  . Hepatitis C Screening  Completed  . HIV Screening  Completed      Plan:    End of life planning; Advance aging; Advanced directives discussed.  Additional information declined at this time.   Plans to call the office with medication update for pcp.    I have personally reviewed and noted the following in the patient's chart:   . Medical and social history . Use of alcohol, tobacco or illicit drugs  . Current medications and supplements . Functional ability and status . Nutritional status . Physical activity . Advanced directives . List of other physicians . Hospitalizations, surgeries, and ER visits in previous 12 months . Vitals . Screenings to include cognitive, depression, and falls . Referrals and appointments  In addition, I have reviewed and discussed with patient certain preventive protocols, quality metrics, and best practice recommendations. A written personalized care plan for preventive services as well as general preventive health recommendations were provided to patient.     Varney Biles, LPN  0/[id number]/[id number]

## 2019-01-27 ENCOUNTER — Telehealth: Payer: Self-pay | Admitting: Family Medicine

## 2019-01-27 DIAGNOSIS — J309 Allergic rhinitis, unspecified: Secondary | ICD-10-CM | POA: Insufficient documentation

## 2019-01-27 NOTE — Assessment & Plan Note (Signed)
Trial of Zyrtec.

## 2019-01-27 NOTE — Assessment & Plan Note (Signed)
Chronic intermittent issues with abdominal pain.  Prior imaging with no obvious cause.  Will refer back to GI.

## 2019-01-27 NOTE — Assessment & Plan Note (Signed)
Patient saw pulmonology for these.  They felt her pleural effusions were likely representative of diastolic heart failure.  No further intervention was recommended.  She does take occasional Lasix for lower extremity edema and she will continue this.  Could consider increasing frequency if needed.

## 2019-01-27 NOTE — Assessment & Plan Note (Signed)
She will complete her course of antibiotics.  Advised her to contact the dental clinic if she has not heard anything by the end of the day.

## 2019-01-27 NOTE — Assessment & Plan Note (Signed)
I will place another referral to gynecology.

## 2019-01-27 NOTE — Assessment & Plan Note (Signed)
She will be scheduled for lab work.  Continue Januvia.

## 2019-01-27 NOTE — Telephone Encounter (Signed)
Please contact the patient and get her scheduled for labs in 2 to 3 weeks.  Orders have been placed.  Please also get her scheduled for follow-up in 4 months.

## 2019-01-31 ENCOUNTER — Other Ambulatory Visit: Payer: Self-pay | Admitting: Family Medicine

## 2019-01-31 DIAGNOSIS — Z760 Encounter for issue of repeat prescription: Secondary | ICD-10-CM

## 2019-02-01 NOTE — Progress Notes (Signed)
I have reviewed the above note and agree.  Primus Gritton, M.D.  

## 2019-02-03 IMAGING — CR DG CERVICAL SPINE 2 OR 3 VIEWS
1 series · 4 of 4 positions shown · non-contrast
Comparison: 02/06/2017

CLINICAL DATA: Neck pain after turning head today

EXAM:
CERVICAL SPINE - 2-3 VIEW

[Series 1: dg cervical spine 2 or 3 views · 0.14mm/px · 4 of 4 slices shown]
[im 1/4]
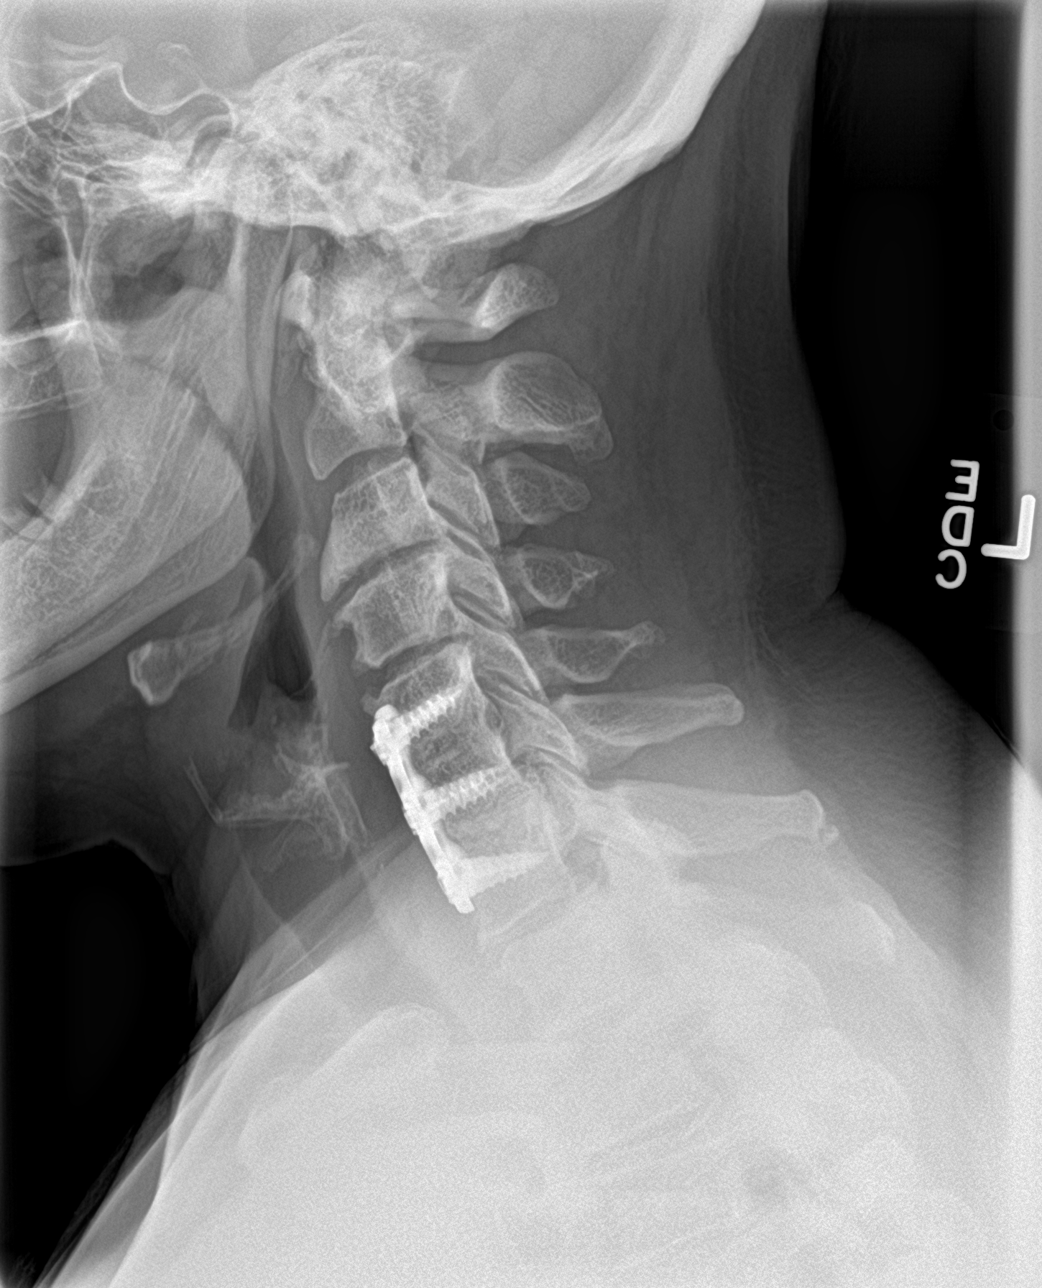
[im 2/4]
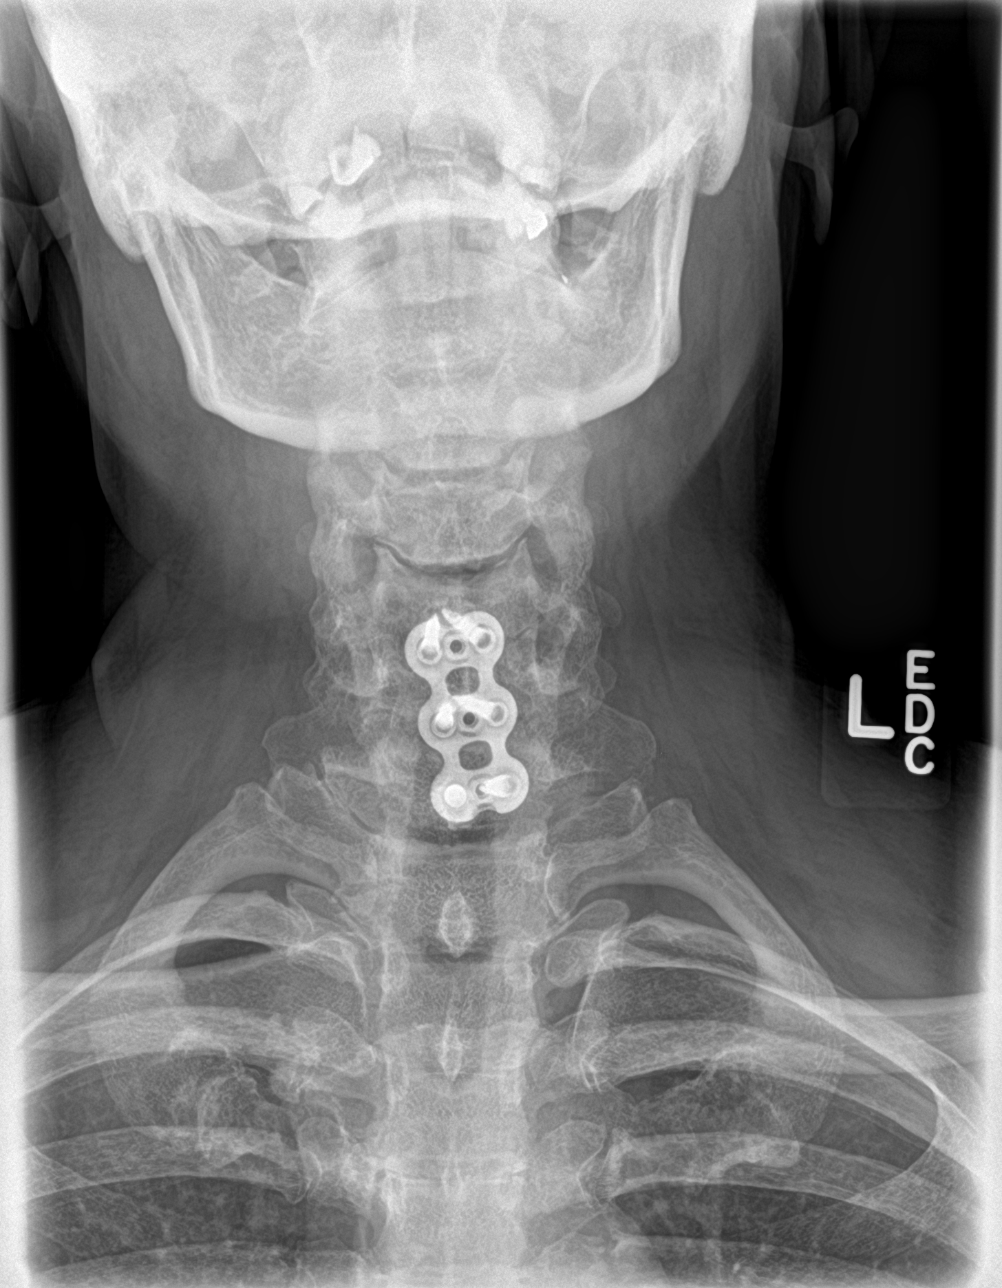
[im 3/4]
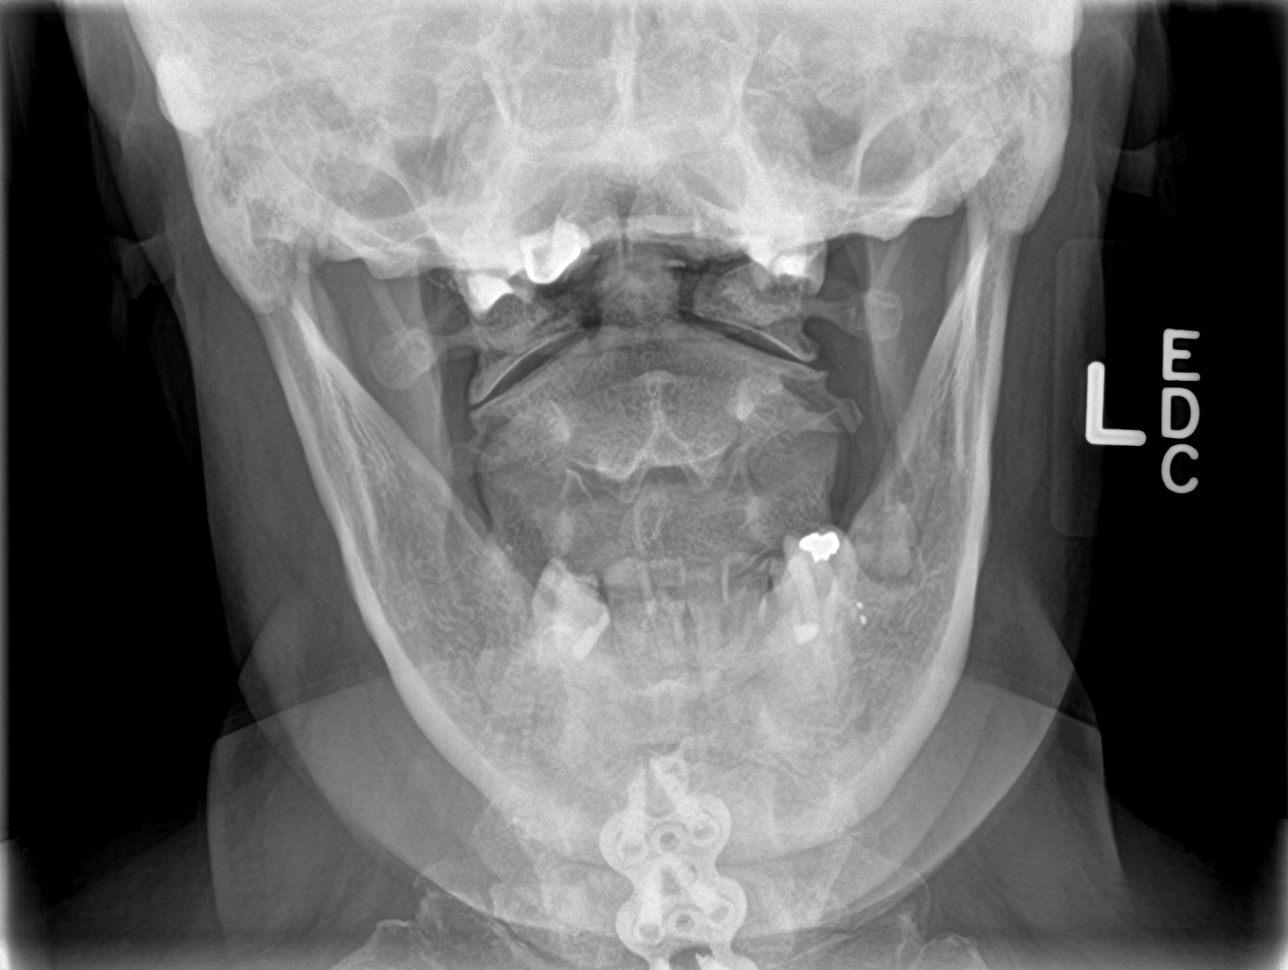
[im 4/4]
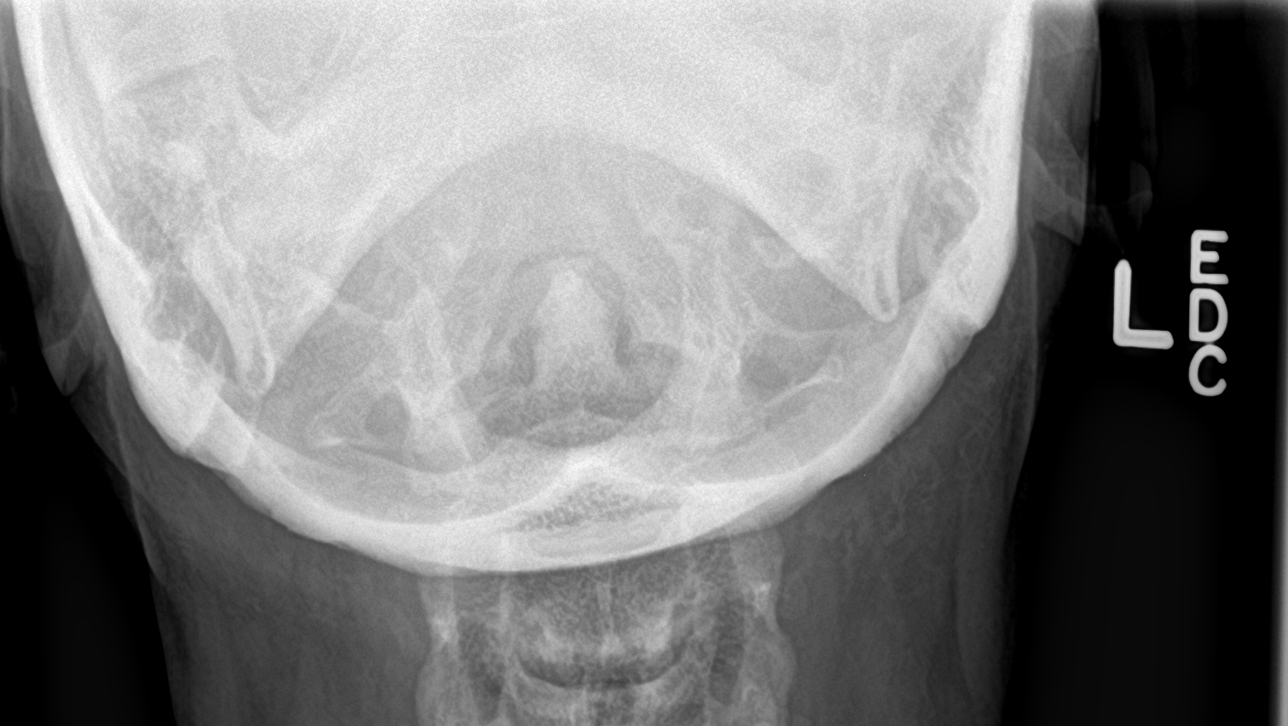

[4 of 4 positions shown; findings below may reference images not displayed]

FINDINGS: Solidly ossified interbody fusion from C5 through C7. Intact
appearance of the anterior plate screw fixation hardware.

The cervical vertebrae are normal in height. No evidence of fracture
or other acute bone abnormality. Moderate cervical degenerative disc
changes, greatest at C3-4 and C4-5. Facet articulations are fairly
well preserved.

No bone lesion or bony destruction. No acute soft tissue
abnormality.
IMPRESSION: Solidly ossified interbody fusion with anterior fixation hardware,
C5 through C7. Moderate cervical degenerative disc changes. No acute
findings.

## 2019-02-09 ENCOUNTER — Other Ambulatory Visit: Payer: Self-pay

## 2019-02-09 ENCOUNTER — Encounter: Payer: Self-pay | Admitting: Family Medicine

## 2019-02-09 ENCOUNTER — Ambulatory Visit (INDEPENDENT_AMBULATORY_CARE_PROVIDER_SITE_OTHER): Payer: Medicare HMO | Admitting: Family Medicine

## 2019-02-09 VITALS — BP 122/78 | HR 100 | Temp 98.4°F | Resp 18 | Ht 66.0 in | Wt 230.2 lb

## 2019-02-09 DIAGNOSIS — Z95828 Presence of other vascular implants and grafts: Secondary | ICD-10-CM | POA: Diagnosis not present

## 2019-02-09 DIAGNOSIS — K219 Gastro-esophageal reflux disease without esophagitis: Secondary | ICD-10-CM | POA: Diagnosis not present

## 2019-02-09 DIAGNOSIS — R1013 Epigastric pain: Secondary | ICD-10-CM

## 2019-02-09 DIAGNOSIS — R1084 Generalized abdominal pain: Secondary | ICD-10-CM

## 2019-02-09 LAB — COMPREHENSIVE METABOLIC PANEL
ALT: 14 U/L (ref 0–35)
AST: 14 U/L (ref 0–37)
Albumin: 3.8 g/dL (ref 3.5–5.2)
Alkaline Phosphatase: 102 U/L (ref 39–117)
BUN: 8 mg/dL (ref 6–23)
CO2: 28 mEq/L (ref 19–32)
Calcium: 9.4 mg/dL (ref 8.4–10.5)
Chloride: 105 mEq/L (ref 96–112)
Creatinine, Ser: 0.82 mg/dL (ref 0.40–1.20)
GFR: 84.7 mL/min (ref >60.00)
Glucose, Bld: 175 mg/dL — ABNORMAL HIGH (ref 70–99)
Potassium: 4.1 mEq/L (ref 3.5–5.1)
Sodium: 140 mEq/L (ref 135–145)
Total Bilirubin: 0.3 mg/dL (ref 0.2–1.2)
Total Protein: 6.8 g/dL (ref 6.0–8.3)

## 2019-02-09 LAB — CBC WITH DIFFERENTIAL/PLATELET
Basophils Absolute: 0.1 10*3/uL (ref 0.0–0.1)
Basophils Relative: 0.8 % (ref 0.0–3.0)
Eosinophils Absolute: 0.3 10*3/uL (ref 0.0–0.7)
Eosinophils Relative: 4.6 % (ref 0.0–5.0)
HCT: 34.3 % — ABNORMAL LOW (ref 36.0–46.0)
Hemoglobin: 11.6 g/dL — ABNORMAL LOW (ref 12.0–15.0)
Lymphocytes Relative: 30.5 % (ref 12.0–46.0)
Lymphs Abs: 2.1 10*3/uL (ref 0.7–4.0)
MCHC: 33.7 g/dL (ref 30.0–36.0)
MCV: 91.5 fl (ref 78.0–100.0)
Monocytes Absolute: 0.4 10*3/uL (ref 0.1–1.0)
Monocytes Relative: 5.2 % (ref 3.0–12.0)
Neutro Abs: 4 10*3/uL (ref 1.4–7.7)
Neutrophils Relative %: 58.9 % (ref 43.0–77.0)
Platelets: 368 10*3/uL (ref 150.0–400.0)
RBC: 3.75 Mil/uL — ABNORMAL LOW (ref 3.87–5.11)
RDW: 14.1 % (ref 11.5–15.5)
WBC: 6.9 10*3/uL (ref 4.0–10.5)

## 2019-02-09 MED ORDER — FAMOTIDINE 20 MG PO TABS: 20.0000 mg | ORAL_TABLET | Freq: Every day | ORAL | 2 refills | Status: DC

## 2019-02-09 NOTE — Progress Notes (Signed)
Subjective:    Patient ID: Valerie Vazquez, female    DOB: 01-05-1956, 63 y.o.   MRN: [id number]  HPI   Patient presents to clinic complaining of upper abdominal pain and decreased appetite.  Patient reports a history of GERD and also states she has been told she has a hiatal hernia.  Currently she takes Protonix daily, but still feels heartburn breakthrough.  Patient believes she has had an endoscopy within the last few years years.  She does have follow-up with gastroenterology planned for June 15.  Patient also would like a referral to a vascular surgeon located in [facility] who specializes in removal of IVC filters.  States she has read an out of IVC filters and does not feel hers is successful and would like it removed.  Patient Active Problem List   Diagnosis Date Noted  . Allergic rhinitis 01/27/2019  . Right hip pain 10/06/2018  . Oxygen saturation greater than 90% 09/28/2018  . Ovarian cyst 09/02/2018  . Orthostatic hypotension 03/21/2018  . Dental infection 03/21/2018  . Boil 10/27/2017  . Urinary incontinence 10/27/2017  . Elevated alkaline phosphatase level 10/27/2017  . Acute right-sided low back pain with right-sided sciatica 10/23/2017  . Subclinical hypothyroidism 08/26/2017  . Soft tissue swelling 05/29/2017  . Diabetes mellitus without complication ([place]) 20/94/7096  . Acute right eye pain 03/25/2017  . Elevated glucose 02/25/2017  . Bruise 02/25/2017  . Paresthesia 02/06/2017  . Falls 01/30/2017  . Colon polyp 01/30/2017  . Right upper quadrant pain 01/12/2017  . Heart disease 12/09/2016  . Plantar fasciitis of left foot 11/07/2016  . Costochondritis 10/06/2016  . CAD (coronary artery disease) 09/28/2016  . Diarrhea 09/16/2016  . Epigastric discomfort 09/16/2016  . Schizoaffective disorder, depressive type ([place]) 08/28/2016  . Suicide attempt ([place]) 08/27/2016  . Colitis 04/08/2016  . Absolute anemia 01/29/2016  . Neck pain 01/29/2016  . Chronic  shoulder pain (Location of Primary Source of Pain) (Left) 01/29/2016  . Osteoarthritis of hip (Bilateral) 01/29/2016  . Lumbar central spinal stenosis (moderate at L4-5; mild at L2-3 and L3-4) 01/29/2016  . Hx of cervical spine surgery 01/29/2016  . Numbness 11/19/2015  . Breast pain 10/19/2015  . Chest pain 10/14/2015  . SOB (shortness of breath) 07/31/2015  . Pleural effusion, bilateral 07/31/2015  . Esophageal spasm 06/05/2015  . History of DVT (deep vein thrombosis) 02/25/2015  . 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 Constipation 12/12/2014  . Coronary vasospasm ([place]) 12/09/2014  . History of pulmonary embolus (PE) 09/19/2014  . Anxiety and depression 04/13/2014  . IBS (irritable bowel syndrome) 04/11/2014  . GERD (gastroesophageal reflux disease) 04/11/2014  . Insomnia 04/11/2014  . Essential hypertension 10/28/2013  . Anemia 10/28/2013  . Hyperlipidemia 10/28/2013   Social History   Tobacco Use  . Smoking status: Former Smoker    Packs/day: 1.50    Years: 10.00    Pack years: 15.00    Types: Cigarettes    Last attempt to quit: 04/12/1979    Years since quitting: 39.8  . Smokeless tobacco: Never Used  Substance Use Topics  . Alcohol use: No    Alcohol/week: 0.0 standard drinks   Past Surgical History:  Procedure Laterality Date  . ABDOMINAL HYSTERECTOMY  1985  . ANTERIOR CERVICAL DECOMP/DISCECTOMY FUSION  10/2012   in [place] C5-C7   . APPENDECTOMY  1985  . BACK SURGERY    . BREAST CYST EXCISION Right   . BREAST EXCISIONAL BIOPSY Right YRS AGO   NEG  .  CARDIAC CATHETERIZATION   2000's; 2009  . CHOLECYSTECTOMY  2002  . COLONOSCOPY WITH PROPOFOL N/A 12/12/2016   Procedure: COLONOSCOPY WITH PROPOFOL;  Surgeon: [name], MD;  Location[facility] END[facility]COPY;  Service: Endoscopy;  Laterality: N/A;  . COLONOSCOPY WITH PROPOFOL N/A 02/17/2017   Procedure: COLONOSCOPY WITH PROPOFOL;  Surgeon: [name], MD;  Location[facility] ENDOSCOPY;  Service: Gastroenterology;  Laterality: N/A;   . ESOPHAGOGASTRODUODENOSCOPY (EGD) WITH PROPOFOL N/A 02/17/2017   Procedure: ESOPHAGOGASTRODUODENOSCOPY (EGD) WITH PROPOFOL;  Surgeon: [name], MD;  Location: [facility] ENDOSCOPY;  Service: Gastroenterology;  Laterality: N/A;  . EXCISION/RELEASE BURSA HIP Right 12/1984  . HERNIA REPAIR  1985  . LEFT HEART CATHETERIZATION WITH CORONARY ANGIOGRAM N/A 12/09/2014   Procedure: LEFT HEART CATHETERIZATION WITH CORONARY ANGIOGRAM;  Surgeon: Burnell Blanks, MD;  Location: [facility] CATH LAB;  Service: Cardiovascular;  Laterality: N/A;  . TONSILLECTOMY AND ADENOIDECTOMY  ~ 1968  . TOTAL HIP ARTHROPLASTY Left 04-06-2014  . UMBILICAL HERNIA REPAIR  1985  . VENA CAVA FILTER PLACEMENT  03/2014   Review of Systems   Constitutional: Negative for chills, fatigue and fever.  HENT: Negative for congestion, ear pain, sinus pain and sore throat.   Eyes: Negative.   Respiratory: Negative for cough, shortness of breath and wheezing.   Cardiovascular: Negative for chest pain, palpitations and leg swelling.  Gastrointestinal: +upper ABD pain, fullness, acid reflux & nausea at times. No diarrhea or vomiting.  Genitourinary: Negative for dysuria, frequency and urgency.  Musculoskeletal: Negative for arthralgias and myalgias.  Skin: Negative for color change, pallor and rash.  Neurological: Negative for syncope, light-headedness and headaches.  Psychiatric/Behavioral: The patient is not nervous/anxious.       Objective:   Physical Exam Vitals signs and nursing note reviewed.  Constitutional:      General: She is not in acute distress.    Appearance: She is obese. She is not ill-appearing, toxic-appearing or diaphoretic.  HENT:     Head: Normocephalic and atraumatic.  Eyes:     General: No scleral icterus.    Extraocular Movements: Extraocular movements intact.     Conjunctiva/sclera: Conjunctivae normal.  Cardiovascular:     Rate and Rhythm: Normal rate and regular rhythm.     Heart sounds: Normal heart  sounds.  Pulmonary:     Effort: Pulmonary effort is normal. No respiratory distress.     Breath sounds: Normal breath sounds.  Abdominal:     General: Bowel sounds are normal. There is no distension.     Palpations: Abdomen is soft. There is no mass.     Tenderness: There is abdominal tenderness (epigastric tenderness). There is no right CVA tenderness, left CVA tenderness, guarding or rebound.  Skin:    General: Skin is warm and dry.     Coloration: Skin is not jaundiced or pale.     Findings: No erythema.  Neurological:     Mental Status: She is alert and oriented to person, place, and time.     Gait: Gait normal.  Psychiatric:        Mood and Affect: Mood normal.        Thought Content: Thought content normal.        Judgment: Judgment normal.    Today's Vitals   02/09/19 1102  BP: 122/78  Pulse: 100  Resp: 18  Temp: 98.4 F (36.9 C)  TempSrc: Oral  SpO2: 95%  Weight: 230 lb 3.2 oz (104.4 kg)  Height: 5\' 6"  (1.676 m)   Body mass index  is 37.16 kg/m.     Assessment & Plan:    A total of 25  minutes were spent face-to-face with the patient during this encounter and over half of that time was spent on counseling and coordination of care. The patient was counseled on possible causes of her ABD pain, plan of care & tests we will order; discussed that I can place referral to MD in [place] & recommend she continue blood thinner eliquis.    1. Epigastric discomfort, Generalized abdominal pain, Gastroesophageal reflux disease without esophagitis  We will get CBC and CMP in clinic to further investigate possible causes of abdominal pain.  I do suspect it is related to acid reflux and GERD.  She will continue Protonix daily and will add Pepcid in the evening to see if this helps better control her symptoms. Also discussed lower acid food choices. Patient does have upcoming appointment with GI and she will keep this.  We will also get CT scan of abdomen to rule out any acute  abdominal etiology, patient wants this imaging done.    2. Presence of IVC filter  Patient requesting referral to vascular surgeon in [place] for evaluation of her IVC filter to see if she can have it removed.  I placed this referral for patient.  Patient will be contacted with information regarding CT scan appt time and location.  She will keep already scheduled appointment with GI.  She will otherwise return to clinic if any issues arise and keep regularly scheduled follow-ups with her specialists and PCP.

## 2019-02-11 ENCOUNTER — Telehealth: Payer: Self-pay

## 2019-02-11 NOTE — Telephone Encounter (Signed)
Copied from [place] [id number]. Topic: General - Other >> Feb 11, 2019 12:12 PM Carolyn Stare wrote: Pt call to follow up on CT that she said was suppose to be scheduled

## 2019-02-14 ENCOUNTER — Ambulatory Visit (INDEPENDENT_AMBULATORY_CARE_PROVIDER_SITE_OTHER): Payer: Medicare HMO | Admitting: Gastroenterology

## 2019-02-14 ENCOUNTER — Other Ambulatory Visit: Payer: Self-pay

## 2019-02-14 ENCOUNTER — Other Ambulatory Visit: Payer: Self-pay | Admitting: Family Medicine

## 2019-02-14 ENCOUNTER — Telehealth: Payer: Self-pay

## 2019-02-14 ENCOUNTER — Encounter: Payer: Medicare Other | Admitting: Obstetrics and Gynecology

## 2019-02-14 DIAGNOSIS — R109 Unspecified abdominal pain: Secondary | ICD-10-CM | POA: Diagnosis not present

## 2019-02-14 DIAGNOSIS — K5900 Constipation, unspecified: Secondary | ICD-10-CM

## 2019-02-14 DIAGNOSIS — M62838 Other muscle spasm: Secondary | ICD-10-CM

## 2019-02-14 NOTE — Telephone Encounter (Signed)
   [facility] Pre-operative Risk Assessment    Request for surgical clearance:  1. What type of surgery is being performed? Upper endoscopy   2. When is this surgery scheduled? TBD (week of 03-07-19)   3. What type of clearance is required (medical clearance vs. Pharmacy clearance to hold med vs. Both)? Medical Clearance  4. Are there any medications that need to be held prior to surgery and how long? Prescribed by pcp   5. Practice name and name of physician performing surgery? [facility], Dr. [name]   6. What is your office phone number [phone]    7.   What is your office fax number [phone]  8.   Anesthesia type (None, local, MAC, general) ? General   Valerie Vazquez 02/14/2019, 2:00 PM  _________________________________________________________________   (provider comments below)

## 2019-02-14 NOTE — Telephone Encounter (Signed)
   Primary Cardiologist: Lauree Chandler, MD  Tried to reach patient multiple times but all time call was failed. Will try again tomorrow.   [place], [place] 02/14/2019, 4:24 PM

## 2019-02-14 NOTE — Progress Notes (Signed)
Valerie Vazquez , MD [address]  [place]  [place], [place] [address]  Main: [phone]  Fax: [phone]   Primary Care Physician: [name], MD  Virtual Visit via Video Note  I connected with patient on 02/14/19 at  9:00 AM EDT by video and verified that I am speaking with the correct person using two identifiers.   I discussed the limitations, risks, security and privacy concerns of performing an evaluation and management service by video  and the availability of in person appointments. I also discussed with the patient that there may be a patient responsible charge related to this service. The patient expressed understanding and agreed to proceed.  Location of Patient: Home Location of Provider: Home Persons involved: Patient and provider only   History of Present Illness: Abdominal pain   HPI: Valerie Vazquez is a 63 y.o. female   Summary of history : Previously been seen for constipation , GERD, elavated GGT in 11/2017(felt secondary to excess tyelnol use)  an dysphagia. Constipation resolved with Rx and dysphagia resolved with PPI  EGD 02/17/17-normal ( at the time of endoscopy she had no dysphagia) Colonoscopy 02/17/17- 6 mm serrated adenoma resected. CT abdomen in 09/2016 showed no abnormalities of the liver .  Interval history7/15/19 -02/14/2019   02/09/2019: CMP-normal LFT's , CBC-stable.   She says she developed abdominal pain for a few years, lately has been getting worse. Just below the sternum , every day , on and off , each episode of pain lasts from a few minutes to a few hours.localized. Was told she has a small hernia. She is concerned that the IVC filter may have issues back in 2015 . She wishes to get the IVC filter taken out in [place] and not over here. The pain is sharp at times and dull at others. No real aggravating factors. Eventually goes aweay on its own  . She suffers from constipation. Has a bowel movement once in 3-4 days  and not hard. She says that the pain is not really changed or unsure after a bowel movement. Gained weight . Denies any early satiety.  Does not take any NSAID's. She is on a blood thinner. Can tolerate fatty meals. S/p cholecystectomy in 2002.   She takes protonix - after her dinner.     Current Outpatient Medications  Medication Sig Dispense Refill   acetaminophen (TYLENOL) 500 MG tablet Take 1 tablet (500 mg total) by mouth every 6 (six) hours as needed. 30 tablet 0   carvedilol (COREG) 3.125 MG tablet Take 1 tablet (3.125 mg total) by mouth 2 (two) times daily with a meal. 60 tablet 11   cetirizine (ZYRTEC) 10 MG tablet Take 1 tablet (10 mg total) by mouth daily. 30 tablet 11   cyclobenzaprine (FLEXERIL) 10 MG tablet TAKE 1 TABLET 3 TIMES A DAY 30 tablet 1   diclofenac sodium (VOLTAREN) 1 % GEL Apply 2 g topically 4 (four) times daily. Right arm/shoulder arthritis 1 Tube 2   dicyclomine (BENTYL) 20 MG tablet TAKE 1 TABLET BY MOUTH 3 TIMES A DAY AS NEEDED FOR SPASMS 270 tablet 0   diphenhydrAMINE (BENADRYL) 25 MG tablet Take 1 tablet (25 mg total) by mouth every 6 (six) hours. 12 tablet 0   ELIQUIS 5 MG TABS tablet TAKE 1 TABLET (5 MG TOTAL) BY MOUTH 2 (TWO) TIMES DAILY. 60 tablet 5   escitalopram (LEXAPRO) 20 MG tablet Take 1 tablet (20 mg total) by mouth daily. 90 tablet  1   famotidine (PEPCID) 20 MG tablet Take 1 tablet (20 mg total) by mouth at bedtime. 30 tablet 2   furosemide (LASIX) 20 MG tablet Take 1 tablet (20 mg total) by mouth daily as needed for edema. 30 tablet 3   gabapentin (NEURONTIN) 600 MG tablet Take 1-1.5 tablets (600-900 mg total) by mouth 2 (two) times daily. Take 1 tablet in the AM and 1.5 tablet at bedtime 225 tablet 1   isosorbide mononitrate (IMDUR) 30 MG 24 hr tablet TAKE 1.5 TABLETS (45 MG TOTAL) BY MOUTH DAILY. 135 tablet 1   JANUVIA 100 MG tablet TAKE 1 TABLET BY MOUTH EVERY DAY 90 tablet 1   levothyroxine (SYNTHROID) 25 MCG tablet TAKE 1 TABLET  BY MOUTH BEFORE BREAKFAST 2-3 HOURS BEFORE TAKING ANY OTHER MEDICATION OR SUPPLEMENT 90 tablet 0   lidocaine (LIDODERM) 5 % Place 1 patch onto the skin daily. Remove & Discard patch within 12 hours. 30 patch 0   Multiple Vitamins-Minerals (SUPER THERA VITE M) TABS Take by mouth.     NIFEdipine (PROCARDIA) 10 MG capsule TAKE 1 CAPSULE BY MOUTH 2 TIMES DAILY. TAKE 30 MINS BEFORE MEALS UP TO 1X PER DAY 180 capsule 1   nitroGLYCERIN (NITROSTAT) 0.4 MG SL tablet Place 1 tablet (0.4 mg total) under the tongue every 5 (five) minutes as needed for chest pain. 25 tablet 6   OLANZapine (ZYPREXA) 10 MG tablet Take 1 tablet (10 mg total) by mouth at bedtime. 90 tablet 0   ondansetron (ZOFRAN) 4 MG tablet Take 1 tablet (4 mg total) by mouth 2 (two) times daily as needed for nausea or vomiting. 30 tablet 0   oxyCODONE-acetaminophen (PERCOCET) 5-325 MG tablet Take 1 tablet by mouth 2 (two) times daily as needed for severe pain. 10 tablet 0   pantoprazole (PROTONIX) 40 MG tablet TAKE 1 TABLET BY MOUTH EVERY DAY 60 tablet 1   potassium chloride SA (K-DUR) 20 MEQ tablet Take 1 tablet (20 mEq total) by mouth daily as needed (take with lasix). 30 tablet 3   pravastatin (PRAVACHOL) 10 MG tablet TAKE 1 TABLET BY MOUTH EVERYDAY AT BEDTIME 90 tablet 1   No current facility-administered medications for this visit.     Allergies as of 02/14/2019 - Review Complete 02/09/2019  Allergen Reaction Noted   Abilify [aripiprazole] Other (See Comments) 09/20/2013   Augmentin [amoxicillin-pot clavulanate] Diarrhea and Nausea And Vomiting 09/20/2013   Bactrim [sulfamethoxazole-trimethoprim] Diarrhea 09/20/2013   Ceftin [cefuroxime axetil] Diarrhea 09/20/2013   Coconut oil  10/19/2015   Diclofenac Other (See Comments) 08/01/2014   Dye fdc red [red dye]  01/22/2016   Indocin [indomethacin] Other (See Comments) 09/20/2013   Linaclotide Diarrhea 12/09/2014   Lisinopril Diarrhea and Other (See Comments)  09/20/2013   Morphine Other (See Comments) 12/09/2014   Morphine and related Other (See Comments) 09/20/2013   Simvastatin Other (See Comments) 08/01/2014   Sulfa antibiotics Diarrhea 12/09/2014   Sulfamethoxazole-trimethoprim Diarrhea 12/09/2014   Wellbutrin [bupropion] Other (See Comments) 09/20/2013   Zetia [ezetimibe]  09/18/2017   Oxycodone-acetaminophen Itching 07/10/2014   Quetiapine fumarate Palpitations 08/01/2014   Quetiapine fumarate Palpitations 08/16/2014    Review of Systems:    All systems reviewed and negative except where noted in HPI.  General Appearance:    Alert, cooperative, no distress, appears stated age  Head:    Normocephalic, without obvious abnormality, atraumatic  Eyes:    PERRL, conjunctiva/corneas clear,  Ears:    Grossly normal hearing    Neurologic:  Grossly  normal    Observations/Objective:  Labs: CMP     Component Value Date/Time   NA 140 02/09/2019 1126   NA 136 05/28/2014 0034   K 4.1 02/09/2019 1126   K 3.6 05/28/2014 0542   CL 105 02/09/2019 1126   CL 106 05/28/2014 0034   CO2 28 02/09/2019 1126   CO2 28 05/28/2014 0034   GLUCOSE 175 (H) 02/09/2019 1126   GLUCOSE 112 (H) 05/28/2014 0034   BUN 8 02/09/2019 1126   BUN 10 05/28/2014 0034   CREATININE 0.82 02/09/2019 1126   CREATININE 1.03 (H) 03/19/2018 1627   CALCIUM 9.4 02/09/2019 1126   CALCIUM 9.2 05/28/2014 0034   PROT 6.8 02/09/2019 1126   PROT 7.1 03/30/2018 0000   PROT 8.0 05/28/2014 0034   ALBUMIN 3.8 02/09/2019 1126   ALBUMIN 4.0 03/30/2018 0000   ALBUMIN 3.6 05/28/2014 0034   AST 14 02/09/2019 1126   AST 44 (H) 05/28/2014 0034   ALT 14 02/09/2019 1126   ALT 33 05/28/2014 0034   ALKPHOS 102 02/09/2019 1126   ALKPHOS 169 (H) 05/28/2014 0034   BILITOT 0.3 02/09/2019 1126   BILITOT 0.3 03/30/2018 0000   BILITOT 0.3 05/28/2014 0034   GFRNONAA >60 07/17/2018 1611   GFRNONAA >60 05/28/2014 0034   GFRNONAA >60 04/07/2014 0700   GFRAA >60 07/17/2018 1611    GFRAA >60 05/28/2014 0034   GFRAA >60 04/07/2014 0700   Lab Results  Component Value Date   WBC 6.9 02/09/2019   HGB 11.6 (L) 02/09/2019   HCT 34.3 (L) 02/09/2019   MCV 91.5 02/09/2019   PLT 368.0 02/09/2019    Imaging Studies: No results found.  Assessment and Plan:   Valerie Vazquez is a 63 y.o. y/o female here today to see me for abdominal discomfort. In the past has been seen for elevated GGT.    She has been on long term acetaminophen which she just stopped and recent LFT's in 10/2017 were  almost normal   Plan  1. LFT's stable no further work up  2. Does not want to try Miralax  Wants to try Colace- if doesn't work will try [place].  3. Stool H Pylori ,TSH 4. Change protonix to before dinner time.  5. Await CT scan results.  6. EGD   F/uy in 4 weeks  I have discussed alternative options, risks & benefits,  which include, but are not limited to, bleeding, infection, perforation,respiratory complication & drug reaction.  The patient agrees with this plan & written consent will be obtained.      I discussed the assessment and treatment plan with the patient. The patient was provided an opportunity to ask questions and all were answered. The patient agreed with the plan and demonstrated an understanding of the instructions.   The patient was advised to call back or seek an in-person evaluation if the symptoms worsen or if the condition fails to improve as anticipated.    Dr Valerie Bellows MD,MRCP [facility]) Gastroenterology/Hepatology Pager: [phone]   Speech recognition software was used to dictate this note.

## 2019-02-15 ENCOUNTER — Telehealth: Payer: Self-pay

## 2019-02-15 ENCOUNTER — Telehealth: Payer: Self-pay | Admitting: Obstetrics & Gynecology

## 2019-02-15 ENCOUNTER — Telehealth: Payer: Self-pay | Admitting: Cardiovascular Disease

## 2019-02-15 NOTE — Telephone Encounter (Signed)
   Primary Cardiologist: Lauree Chandler, MD  Chart reviewed as part of pre-operative protocol coverage. Given past medical history and time since last visit, based on ACC/AHA guidelines, Valerie Vazquez would be at acceptable risk for the planned procedure without further cardiovascular testing.   I will route this recommendation to the requesting party via Epic fax function and remove from pre-op pool.  Please call with questions.  [place], [place] 02/15/2019, 4:37 PM

## 2019-02-15 NOTE — Telephone Encounter (Signed)
[facility] referring for Cyst of ovary, unspecified laterality. Patient was schedule 02/14/19 with Dr. Glennon Mac. Appointment was cancelled. Attempt to reach patient to reschedule left voicemail to call back to be schedule.

## 2019-02-15 NOTE — Telephone Encounter (Signed)
New message:     Patient retruning a call from yesterday concerning a medical clearance. I did reach out Vin. Please call patient back.

## 2019-02-15 NOTE — Telephone Encounter (Signed)
I have tried multiple times but still going to voice mail.

## 2019-02-15 NOTE — Telephone Encounter (Signed)
Spoke with pt and informed her that cardiology has tried contacting her regarding cardiac clearance for her endoscopy procedure. I explained to pt that she will need to return their call. Pt agrees.

## 2019-02-15 NOTE — Telephone Encounter (Signed)
Left voice mail to call back 

## 2019-02-16 DIAGNOSIS — K5900 Constipation, unspecified: Secondary | ICD-10-CM | POA: Diagnosis not present

## 2019-02-16 DIAGNOSIS — R109 Unspecified abdominal pain: Secondary | ICD-10-CM | POA: Diagnosis not present

## 2019-02-17 ENCOUNTER — Encounter: Payer: Self-pay | Admitting: Gastroenterology

## 2019-02-17 DIAGNOSIS — M1711 Unilateral primary osteoarthritis, right knee: Secondary | ICD-10-CM | POA: Diagnosis not present

## 2019-02-17 DIAGNOSIS — M2352 Chronic instability of knee, left knee: Secondary | ICD-10-CM | POA: Diagnosis not present

## 2019-02-17 DIAGNOSIS — M2351 Chronic instability of knee, right knee: Secondary | ICD-10-CM | POA: Diagnosis not present

## 2019-02-17 DIAGNOSIS — M1712 Unilateral primary osteoarthritis, left knee: Secondary | ICD-10-CM | POA: Diagnosis not present

## 2019-02-17 LAB — TSH: TSH: 2.47 u[IU]/mL (ref 0.450–4.500)

## 2019-02-18 ENCOUNTER — Telehealth: Payer: Self-pay | Admitting: Family

## 2019-02-18 ENCOUNTER — Ambulatory Visit
Admission: RE | Admit: 2019-02-18 | Discharge: 2019-02-18 | Disposition: A | Discharge status: Home / Self Care | Payer: Medicare HMO | Source: Ambulatory Visit | Attending: Family Medicine | Admitting: Family Medicine

## 2019-02-18 ENCOUNTER — Other Ambulatory Visit: Payer: Self-pay

## 2019-02-18 DIAGNOSIS — R1013 Epigastric pain: Secondary | ICD-10-CM | POA: Diagnosis present

## 2019-02-18 DIAGNOSIS — N83202 Unspecified ovarian cyst, left side: Secondary | ICD-10-CM | POA: Diagnosis not present

## 2019-02-18 DIAGNOSIS — J90 Pleural effusion, not elsewhere classified: Secondary | ICD-10-CM

## 2019-02-18 DIAGNOSIS — R1084 Generalized abdominal pain: Secondary | ICD-10-CM | POA: Insufficient documentation

## 2019-02-18 DIAGNOSIS — N83201 Unspecified ovarian cyst, right side: Secondary | ICD-10-CM | POA: Diagnosis not present

## 2019-02-18 MED ORDER — IOHEXOL 300 MG/ML  SOLN
100.0000 mL | Freq: Once | INTRAMUSCULAR | Status: AC | PRN
  Administered 2019-02-18: 100 mL via INTRAVENOUS

## 2019-02-18 NOTE — Telephone Encounter (Signed)
I spoke with patient  She has dull pain 'at top of abdomen'.   Not affecting by meals. NO fever, early satiety, vomiting. No alcohol, nsaids.   Pain is similar to when she saw Dr Vicente Males 4 days ago. Pain hasnt increased. Doesn't feel she needs to be seen in ED today.    Discussed EGD with Dr Vicente Males per patient.   Reviewed chart  Has seen Dr Vicente Males who recommended protonix at bedtime, she is also pepcid.  Former smoker; some wheezing. No sob, cp.   Hasnt seen pulmonology ; placed referral for today due to bl effusions seen.   Advised of Atherosclerosis - advised continue statin.   Advised to follow up Dr Vicente Males  She will certainly let us know of any new worsening symptoms.

## 2019-02-19 LAB — H. PYLORI ANTIGEN, STOOL: H pylori Ag, Stl: NEGATIVE

## 2019-02-21 ENCOUNTER — Telehealth: Payer: Self-pay | Admitting: Gastroenterology

## 2019-02-21 NOTE — Telephone Encounter (Signed)
Spoke with pt and informed her that we have received her cardiac clearance but we are still waiting for the blood thinner holding instructions for the Eliquis from Dr. Caryl Bis.

## 2019-02-21 NOTE — Telephone Encounter (Signed)
Patient called & would like to know if DR Vicente Males still plans to do a endoscopy on her?

## 2019-02-22 ENCOUNTER — Telehealth: Payer: Self-pay

## 2019-02-22 NOTE — Telephone Encounter (Signed)
Patient called & was checking to see if the clearance for her Eliquis had come through.

## 2019-02-22 NOTE — Telephone Encounter (Signed)
Copied from [place] [id number]. Topic: General - Other >> Feb 21, 2019  4:23 PM Valerie Vazquez wrote: Reason for CRM: Patient would like to know if office has gotten fax from Dr. Georgeann Oppenheim office. She is requesting a call back.

## 2019-02-22 NOTE — Telephone Encounter (Signed)
Copied from [place] [id number]. Topic: General - Other >> Feb 22, 2019 12:01 PM [place] A wrote: Patient stated that she looking for a callback on the paperwork that was to be faxed to her gastro provider Dr .Vicente Males.

## 2019-02-23 NOTE — Telephone Encounter (Signed)
[facility] calling about parer work that they have been waiting for so patient can get the care that she need from Dr Vicente Males office. Please advise Ph# [phone]

## 2019-02-23 NOTE — Telephone Encounter (Signed)
Spoke with pt and informed her that I contacted Dr. Ellen Henri office regarding the blood thinner holding request. I explained that we are still waiting for his response.

## 2019-02-23 NOTE — Telephone Encounter (Signed)
Patient was returning Jadijah's call

## 2019-02-23 NOTE — Telephone Encounter (Signed)
Called pt to inform her that I have called Dr. Ellen Henri office to check the status of the blood thinner holding request. They did receive the request form but Dr. Caryl Bis has not completed the form yet. I was told by the receptionist that she will send a high priority message regarding the request.  Unable to contact pt, left a detailed VM

## 2019-02-23 NOTE — Telephone Encounter (Signed)
I am not sure what paper work they are talking about? 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

## 2019-02-24 ENCOUNTER — Ambulatory Visit (INDEPENDENT_AMBULATORY_CARE_PROVIDER_SITE_OTHER): Payer: Medicare Other | Admitting: Obstetrics and Gynecology

## 2019-02-24 ENCOUNTER — Telehealth: Payer: Self-pay | Admitting: Internal Medicine

## 2019-02-24 ENCOUNTER — Other Ambulatory Visit: Payer: Self-pay

## 2019-02-24 ENCOUNTER — Encounter: Payer: Self-pay | Admitting: Obstetrics and Gynecology

## 2019-02-24 ENCOUNTER — Other Ambulatory Visit (INDEPENDENT_AMBULATORY_CARE_PROVIDER_SITE_OTHER): Payer: Medicare HMO

## 2019-02-24 VITALS — BP 156/99 | Ht 66.0 in | Wt 229.0 lb

## 2019-02-24 DIAGNOSIS — E119 Type 2 diabetes mellitus without complications: Secondary | ICD-10-CM

## 2019-02-24 DIAGNOSIS — N83201 Unspecified ovarian cyst, right side: Secondary | ICD-10-CM

## 2019-02-24 DIAGNOSIS — N83202 Unspecified ovarian cyst, left side: Secondary | ICD-10-CM | POA: Diagnosis not present

## 2019-02-24 DIAGNOSIS — K034 Hypercementosis: Secondary | ICD-10-CM

## 2019-02-24 LAB — POCT GLYCOSYLATED HEMOGLOBIN (HGB A1C): Hemoglobin A1C: 7.6 % — AB (ref 4.0–5.6)

## 2019-02-24 LAB — BASIC METABOLIC PANEL
BUN: 10 mg/dL (ref 6–23)
CO2: 19 mEq/L (ref 19–32)
Calcium: 9.1 mg/dL (ref 8.4–10.5)
Chloride: 105 mEq/L (ref 96–112)
Creatinine, Ser: 0.98 mg/dL (ref 0.40–1.20)
GFR: 68.94 mL/min (ref >60.00)
Glucose, Bld: 205 mg/dL — ABNORMAL HIGH (ref 70–99)
Potassium: 4 mEq/L (ref 3.5–5.1)
Sodium: 138 mEq/L (ref 135–145)

## 2019-02-24 NOTE — Telephone Encounter (Signed)
Due to recent travel and new onset of symptoms, I would recommend virtual or phone unless she wishes to push appt out.

## 2019-02-24 NOTE — Telephone Encounter (Signed)
Called pt to let her know of below note.

## 2019-02-24 NOTE — Progress Notes (Signed)
Obstetrics & Gynecology Office Visit   Chief Complaint:  Chief Complaint  Patient presents with  . Ovarian Cyst    both sides, pain at times, had CT scan 6/19  Referral for ovarian cys t by Dr. Caryl Bis at [facility]  History of Present Illness: 63 y.o. [id number] female who presents in referral from Dr. [name] from [facility] for ovarian cysts.  She states that she has had a cyst on her left ovary "for years." She states that she has a right ovarian cyst for an undetermined amount of time.  She had a pelvic ultrasound in January showing a cyst on each ovary both with simple features.  They do appear to be unchanged since January of 2020.   She has been having some pain, off and on, for years. She states she might have an issue for a couple of years.  The pain comes about monthly.  The pain may last for about 20 minutes.  She describes the pain as dull.  She rates the pain as a 5/10.  Aggravating factors: nothing.  Alleviating factors: Nothing.  Associated symptoms: none.  She specifically denies nausea, vomiting, blood in her urine or stool, diarrhea. She has IBS and has constipation chronically.  She has no issues with weight loss.  She denies bloating.  She does not early satiety. She notes her appetite is decreased.    The last pelvic ultrasound she had was in 09/2018.  It showed:  Right ovary: small right ovarian cyst 2.5 x 2.1 x 2.4 cm, simple features.  It measured 1.9 x 1.9 x 1.8 cm on prior CT. No additional mass. Left ovary: small cyst 1.9 x 1.7 x 2.2 cm, simple features.  It measured 2.0 x 2.1 x 1.8 cm on prior CT. No additional mass.     Past Medical History:  Diagnosis Date  . Acute right-sided low back pain with right-sided sciatica 10/23/2017  . Anemia   . Bilateral renal cysts   . CAD (coronary artery disease) 09/28/2016  . Chest pain 10/14/2015  . Chronic back pain    "upper and lower" (09/19/2014)  . Chronic shoulder pain (Location of Primary  Source of Pain) (Left) 01/29/2016  . Colitis 04/08/2016  . Coronary vasospasm ([place]) 12/09/2014  . Depression    "major" (09/19/2014)  . DVT (deep venous thrombosis) ([place])   . Esophageal spasm   . Essential hypertension 10/28/2013  . Gallstones   . Gastritis   . Gastroenteritis   . GERD (gastroesophageal reflux disease)   . Headache    "couple /month lately" (09/19/2014)  . Heart disease 12/09/2016  . High blood pressure   . High cholesterol   . History of blood transfusion 1985   related to hysterectomy  . History of nuclear stress test    Myoview 1/19:  EF 65, no ischemia or scar  . History of pulmonary embolus (PE) 09/19/2014  . 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 Rule out Paget's disease of the bone with blood work.   . Hyperlipidemia 10/28/2013  . Hypertension   . IBS (irritable bowel syndrome)   . Lumbar central spinal stenosis (moderate at L4-5; mild at L2-3 and L3-4) 01/29/2016  . Migraine    "a few times/yr" (09/19/2014)  . Myocardial infarction ([place]) 10/2010 X 3   "while hospitalized"  . Neck pain 01/29/2016  . Osteoarthritis    "qwhere; mostly around my joints" (09/19/2014)  . Pleural effusion, bilateral 07/31/2015  . Pneumonia  04/2014  . PTSD (post-traumatic stress disorder)   . Pulmonary embolism ([place]) 04/2001; 09/19/2014   "after gallbladder OR; "  . Schizoaffective disorder ([place])   . Schizoaffective disorder, depressive type ([place]) 08/28/2016  . Sleep apnea    "mild" (09/19/2014)  . Snoring    a. sleep study 5/16: No OSA  . SOB (shortness of breath) 07/31/2015  . Spinal stenosis   . Spondylosis   . Suicide attempt ([place]) 08/27/2016  . Type II diabetes mellitus ([place])    "dx'd in 2007; lost weight; no RX for ~ 4 yr now" (09/19/2014)  . Wheezing 02/25/2017    Past Surgical History:  Procedure Laterality Date  . ABDOMINAL HYSTERECTOMY  1985  . ANTERIOR CERVICAL DECOMP/DISCECTOMY FUSION  10/2012   in [place] C5-C7   . APPENDECTOMY  1985  .  BACK SURGERY    . BREAST CYST EXCISION Right   . BREAST EXCISIONAL BIOPSY Right YRS AGO   NEG  . CARDIAC CATHETERIZATION   2000's; 2009  . CHOLECYSTECTOMY  2002  . COLONOSCOPY WITH PROPOFOL N/A 12/12/2016   Procedure: COLONOSCOPY WITH PROPOFOL;  Surgeon: [name], MD;  Location: [facility] ENDOSCOPY;  Service: Endoscopy;  Laterality: N/A;  . COLONOSCOPY WITH PROPOFOL N/A 02/17/2017   Procedure: COLONOSCOPY WITH PROPOFOL;  Surgeon: [name], MD;  Location: [facility] ENDOSCOPY;  Service: Gastroenterology;  Laterality: N/A;  . ESOPHAGOGASTRODUODENOSCOPY (EGD) WITH PROPOFOL N/A 02/17/2017   Procedure: ESOPHAGOGASTRODUODENOSCOPY (EGD) WITH PROPOFOL;  Surgeon: [name], MD;  Location: [facility] ENDOSCOPY;  Service: Gastroenterology;  Laterality: N/A;  . EXCISION/RELEASE BURSA HIP Right 12/1984  . HERNIA REPAIR  1985  . LEFT HEART CATHETERIZATION WITH CORONARY ANGIOGRAM N/A 12/09/2014   Procedure: LEFT HEART CATHETERIZATION WITH CORONARY ANGIOGRAM;  Surgeon: Burnell Blanks, MD;  Location: [facility] CATH LAB;  Service: Cardiovascular;  Laterality: N/A;  . TONSILLECTOMY AND ADENOIDECTOMY  ~ 1968  . TOTAL HIP ARTHROPLASTY Left 04-06-2014  . UMBILICAL HERNIA REPAIR  1985  . VENA CAVA FILTER PLACEMENT  03/2014    Gynecologic History: No LMP recorded. Patient has had a hysterectomy.  Obstetric History: [id number]  Family History  Problem Relation Age of Onset  . Stroke Mother        Deceased  . Lung cancer Father        Deceased  . Healthy Daughter   . Healthy Son        x 2  . Other Son        Suicide  . Cancer Brother   . Heart attack Neg Hx     Social History   Socioeconomic History  . Marital status: Divorced    Spouse name: Not on file  . Number of children: Not on file  . Years of education: Not on file  . Highest education level: Not on file  Occupational History  . Not on file  Social Needs  . Financial resource strain: Not hard at all  . Food insecurity    Worry: Never true     Inability: Never true  . Transportation needs    Medical: No    Non-medical: No  Tobacco Use  . Smoking status: Former Smoker    Packs/day: 1.50    Years: 10.00    Pack years: 15.00    Types: Cigarettes    Quit date: 04/12/1979    Years since quitting: 39.9  . Smokeless tobacco: Never Used  Substance and Sexual Activity  . Alcohol use: No    Alcohol/week: 0.0 standard drinks  .  Drug use: No  . Sexual activity: Not Currently  Lifestyle  . Physical activity    Days per week: 0 days    Minutes per session: Not on file  . Stress: Not on file  Relationships  . Social Herbalist on phone: Not on file    Gets together: Not on file    Attends religious service: Not on file    Active member of club or organization: Not on file    Attends meetings of clubs or organizations: Not on file    Relationship status: Not on file  . Intimate partner violence    Fear of current or ex partner: No    Emotionally abused: No    Physically abused: No    Forced sexual activity: No  Other Topics Concern  . Not on file  Social History Narrative   Lives with fiancee in an apartment on the first floor.  Has 3 grown children.  One child passed away.  On disability 02-Dec-1994 - 2000, 2007 - current.     Education: some college.    Allergies  Allergen Reactions  . Abilify [Aripiprazole] Other (See Comments)    Jerking movement, slow motor skills  . Augmentin [Amoxicillin-Pot Clavulanate] Diarrhea and Nausea And Vomiting  . Bactrim [Sulfamethoxazole-Trimethoprim] Diarrhea  . Ceftin [Cefuroxime Axetil] Diarrhea  . Coconut Oil     Rash   . Diclofenac Other (See Comments)     Muscle Pain  . Dye Fdc Red [Red Dye]     "hair dye"  . Indocin [Indomethacin] Other (See Comments)    Migraines   . Linaclotide Diarrhea  . Lisinopril Diarrhea and Other (See Comments)    Other reaction(s): Dizziness, Vomiting  . Morphine Other (See Comments)    Chest Tightness  . Morphine And Related Other (See  Comments)    Chest tightness   . Simvastatin Other (See Comments)    Muscle Pain  . Sulfa Antibiotics Diarrhea  . Sulfamethoxazole-Trimethoprim Diarrhea  . Wellbutrin [Bupropion] Other (See Comments)    Jerking movement Jerking movement Slurred speech  . Zetia [Ezetimibe]     Diarrhea   . Oxycodone-Acetaminophen Itching  . Quetiapine Fumarate Palpitations  . Quetiapine Fumarate Palpitations    Prior to Admission medications   Medication Sig Start Date End Date Taking? Authorizing Provider  acetaminophen (TYLENOL) 500 MG tablet Take 1 tablet (500 mg total) by mouth every 6 (six) hours as needed. 09/29/18  Yes Laban Emperor, PA-C  carvedilol (COREG) 3.125 MG tablet Take 1 tablet (3.125 mg total) by mouth 2 (two) times daily with a meal. 03/08/18  Yes Burnell Blanks, MD  cetirizine (ZYRTEC) 10 MG tablet Take 1 tablet (10 mg total) by mouth daily. 01/25/19  Yes [name], MD  cyclobenzaprine (FLEXERIL) 10 MG tablet TAKE 1 TABLET BY MOUTH THREE TIMES A DAY 02/19/19  Yes [name], MD  dicyclomine (BENTYL) 20 MG tablet TAKE 1 TABLET BY MOUTH 3 TIMES A DAY AS NEEDED FOR SPASMS 12/21/18  Yes [name], MD  diphenhydrAMINE (BENADRYL) 25 MG tablet Take 1 tablet (25 mg total) by mouth every 6 (six) hours. 09/12/16  Yes Jola Schmidt, MD  ELIQUIS 5 MG TABS tablet TAKE 1 TABLET (5 MG TOTAL) BY MOUTH 2 (TWO) TIMES DAILY. 10/11/18  Yes [name], MD  escitalopram (LEXAPRO) 20 MG tablet Take 1 tablet (20 mg total) by mouth daily. 07/01/18  Yes Ursula Alert, MD  famotidine (PEPCID) 20 MG tablet  Take 1 tablet (20 mg total) by mouth at bedtime. 02/09/19  Yes Guse, Jacquelynn Cree, FNP  furosemide (LASIX) 20 MG tablet Take 1 tablet (20 mg total) by mouth daily as needed for edema. 01/25/19  Yes [name], MD  gabapentin (NEURONTIN) 600 MG tablet Take 1-1.5 tablets (600-900 mg total) by mouth 2 (two) times daily. Take 1 tablet in the AM and 1.5 tablet at bedtime  07/01/18  Yes Eappen, Saramma, MD  isosorbide mononitrate (IMDUR) 30 MG 24 hr tablet TAKE 1.5 TABLETS (45 MG TOTAL) BY MOUTH DAILY. 12/23/18  Yes Burnell Blanks, MD  JANUVIA 100 MG tablet TAKE 1 TABLET BY MOUTH EVERY DAY 12/23/18  Yes [name], MD  levothyroxine (SYNTHROID) 25 MCG tablet TAKE 1 TABLET BY MOUTH BEFORE BREAKFAST 2-3 HOURS BEFORE TAKING ANY OTHER MEDICATION OR SUPPLEMENT 12/20/18  Yes [name], MD  lidocaine (LIDODERM) 5 % Place 1 patch onto the skin daily. Remove & Discard patch within 12 hours. 10/06/18  Yes [name], MD  Multiple Vitamins-Minerals (SUPER THERA VITE M) TABS Take by mouth.   Yes [provider]  NIFEdipine (PROCARDIA) 10 MG capsule TAKE 1 CAPSULE BY MOUTH 2 TIMES DAILY. TAKE 30 MINS BEFORE MEALS UP TO 1X PER DAY 11/23/18  Yes [name], MD  OLANZapine (ZYPREXA) 10 MG tablet Take 1 tablet (10 mg total) by mouth at bedtime. 12/16/18  Yes Ursula Alert, MD  ondansetron (ZOFRAN) 4 MG tablet Take 1 tablet (4 mg total) by mouth 2 (two) times daily as needed for nausea or vomiting. 11/29/18  Yes [name], MD  oxyCODONE-acetaminophen (PERCOCET) 5-325 MG tablet Take 1 tablet by mouth 2 (two) times daily as needed for severe pain. 01/21/19  Yes McLean-Scocuzza, Nino Glow, MD  pantoprazole (PROTONIX) 40 MG tablet TAKE 1 TABLET BY MOUTH EVERY DAY 01/31/19  Yes [name], MD  potassium chloride SA (K-DUR) 20 MEQ tablet Take 1 tablet (20 mEq total) by mouth daily as needed (take with lasix). 01/25/19  Yes [name], MD  pravastatin (PRAVACHOL) 10 MG tablet TAKE 1 TABLET BY MOUTH EVERYDAY AT BEDTIME 09/23/18  Yes [name], MD  diclofenac sodium (VOLTAREN) 1 % GEL Apply 2 g topically 4 (four) times daily. Right arm/shoulder arthritis Patient not taking: Reported on 02/24/2019 04/08/18   McLean-Scocuzza, Nino Glow, MD  nitroGLYCERIN (NITROSTAT) 0.4 MG SL tablet Place 1 tablet (0.4 mg total) under the tongue  every 5 (five) minutes as needed for chest pain. 03/08/18 06/06/18  Burnell Blanks, MD    Review of Systems  Constitutional: Negative.   HENT: Negative.   Eyes: Negative.   Respiratory: Negative.   Cardiovascular: Negative.   Gastrointestinal: Negative.   Genitourinary: Negative.   Musculoskeletal: Negative.   Skin: Negative.   Neurological: Negative.   Psychiatric/Behavioral: Negative.      Physical Exam BP (!) 156/99   Ht 5\' 6"  (1.676 m)   Wt 229 lb (103.9 kg)   BMI 36.96 kg/m  No LMP recorded. Patient has had a hysterectomy. Physical Exam Constitutional:      General: She is not in acute distress.    Appearance: Normal appearance.  HENT:     Head: Normocephalic and atraumatic.  Eyes:     General: No scleral icterus.    Conjunctiva/sclera: Conjunctivae normal.  Neurological:     General: No focal deficit present.     Mental Status: She is alert and oriented to person, place, and  time.     Cranial Nerves: No cranial nerve deficit.  Psychiatric:        Mood and Affect: Mood normal.        Behavior: Behavior normal.        Judgment: Judgment normal.     Female chaperone present for pelvic and breast  portions of the physical exam  Assessment: 63 y.o. [id number] female here for  1. Cysts of both ovaries      Plan: Problem List Items Addressed This Visit      Endocrine   Ovarian cyst - Primary   Relevant Orders   US PELVIS TRANSVAGINAL NON-OB (TV ONLY)     Small, stable simple-appearing cysts on both ovaries.  She does have some mild pain occasionally.  We discussed that she is a high surgical risk and that the ovarian cysts are likely to be benign.  We discussed weighing the risks of surgery with the benefits of removing the cysts (ovaries).  We also discussed while the pain is possibly coming from the cysts, there is a chance that even with the risks of the surgery and removal of the ovaries that the pain would continue.   We discussed, as an alternative,  to monitor her cysts and her pain. If her health is optimized she might be a better candidate for surgery. If her cysts take on any concerning characteristics, we might weight the risks/benefits differently.   Plan is to obtain a pelvic ultrasound in January 2021.  She voiced understanding and agreement with the plan.  30 minutes spent in face to face discussion with > 50% spent in counseling,management, and coordination of care of her bilateral ovarian cysts.   Prentice Docker, MD 02/24/2019 4:00 PM     CC: [name], MD [place],  [place] [id number]

## 2019-02-24 NOTE — Telephone Encounter (Signed)
Spoke with pt and appt. Has been changed to virtual visit. Nothing further needed

## 2019-02-24 NOTE — Telephone Encounter (Signed)
I received the paperwork and gave it to provider to sign and pt is aware.  Leandrea Ackley,cma

## 2019-02-24 NOTE — Telephone Encounter (Signed)
Called patient for COVID-19 pre-screening for in office visit.  Have you recently traveled any where out of the local area in the [facility] [place] [place] [place]  Have you been in close contact with a person diagnosed with COVID-19 within the last 2 weeks? No  Do you currently have any of the following symptoms? If so, when did they start? Cough (Yes for 1 week, at night, believes its due to allergies( Diarrhea  Joint Pain Fever      Muscle Pain   Red eyes Shortness of breath (Yes- 1 week)  Abdominal pain Vomiting Loss of smell    Rash    Sore Throat Headache    Weakness   Bruising or bleeding  Wheezing- 1 week  Okay to proceed with visit. (date)  / Needs to reschedule visit. (date)

## 2019-02-24 NOTE — Progress Notes (Signed)
[facility]     Assessment and Plan:  Small bilateral pleural effusions, suspect diastolic dysfunction. -We will check echocardiogram. - Discussed with patient that these small bilateral pleural effusions are too small to intervene, and are likely related to diastolic dysfunction with volume overload.  Currently she does not appear to be symptomatic from this finding.  Should she develop a significant symptoms we can consider trial of Lasix, she currently has Lasix which she uses as needed for leg swelling which she has not had to use in some months.  Recurrent PE. - No recent symptoms, continues to be on lifelong Eliquis.  Orders Placed This Encounter  Procedures  . ECHOCARDIOGRAM COMPLETE      Date: 02/25/2019  MRN# [id number] DARLYS BUIS April 29, 1956  Referring Physician:  Rande Brunt FNP.  KORTNI HASTEN is a 63 y.o. old female seen in consultation for chief complaint of: pleural effusion, chronic cough and dyspnea on exertion.  She was maintained on lifelong Eliquis.  She was seen at that time due to a CT chest which showed small bilateral pleural effusions, at that time she denied any significant dyspnea on exertion.  Echocardiogram at that time showed    HPI:  FIORELLA HANAHAN is a 63 y.o. old female, I last saw her in October 2018 at that time she was noted to have a history of recurrent PE, history pneumonia, small bilateral pleural effusions echocardiogram previously in 2017 showed EF of 65%, otherwise unremarkable.  At that time it was thought that the small bilateral pleural effusions were secondary to diastolic dysfunction.  She was asked to call back if she develops dyspnea at which time the plan was to use her Lasix regularly.  Currently she notes that her breathing has been ok.  She is referred back today because of again the finding of small bilateral pleural effusions seen on recent CT abdomen pelvis on 02/18/2019.  She denies any significant dyspnea  at this time, she denies cough, hemoptysis.  She does know that she is not very active.  While she is not generally limited by breathing she will get winded when she is "busy" and will start sweating.  She has a cat all over house.  She has reflux, she takes protonix.  She has runny nose.  **CBC 02/09/2019>> AEC 300 **CT AP 02/18/2019, CT chest 07/17/2018>> images personally reviewed, tiny bilateral effusions, enlarged liver.  Previous CT shows small bilateral pleural effusions with some slight compressive atelectasis.  Dilated IVC suggestive of volume overload. Echo 09/29/15 2017; LV EF: 60% - 65% Ct scan 02/23/17; images personally reviewed, there were small bilateral pleural effusions, enlarged pulmonary veins, both indicative of mild pulmonary venous hypertension, with volume overload. PSG 01/11/15>>AHI was 0.5, which increased to as high as 15.7, when the patient was supine and REM sleep. However, overall the test was negative for obstructive sleep apnea  PMHX:   Past Medical History:  Diagnosis Date  . Acute right-sided low back pain with right-sided sciatica 10/23/2017  . Anemia   . Bilateral renal cysts   . CAD (coronary artery disease) 09/28/2016  . Chest pain 10/14/2015  . Chronic back pain    "upper and lower" (09/19/2014)  . Chronic shoulder pain (Location of Primary Source of Pain) (Left) 01/29/2016  . Colitis 04/08/2016  . Coronary vasospasm ([place]) 12/09/2014  . Depression    "major" (09/19/2014)  . DVT (deep venous thrombosis) ([place])   . Esophageal spasm   . Essential hypertension 10/28/2013  .  Gallstones   . Gastritis   . Gastroenteritis   . GERD (gastroesophageal reflux disease)   . Headache    "couple /month lately" (09/19/2014)  . Heart disease 12/09/2016  . High blood pressure   . High cholesterol   . History of blood transfusion 1985   related to hysterectomy  . History of nuclear stress test    Myoview 1/19:  EF 65, no ischemia or scar  . History of pulmonary embolus  (PE) 09/19/2014  . 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 Rule out Paget's disease of the bone with blood work.   . Hyperlipidemia 10/28/2013  . Hypertension   . IBS (irritable bowel syndrome)   . Lumbar central spinal stenosis (moderate at L4-5; mild at L2-3 and L3-4) 01/29/2016  . Migraine    "a few times/yr" (09/19/2014)  . Myocardial infarction ([place]) 10/2010 X 3   "while hospitalized"  . Neck pain 01/29/2016  . Osteoarthritis    "qwhere; mostly around my joints" (09/19/2014)  . Pleural effusion, bilateral 07/31/2015  . Pneumonia 04/2014  . PTSD (post-traumatic stress disorder)   . Pulmonary embolism ([place]) 04/2001; 09/19/2014   "after gallbladder OR; "  . Schizoaffective disorder ([place])   . Schizoaffective disorder, depressive type ([place]) 08/28/2016  . Sleep apnea    "mild" (09/19/2014)  . Snoring    a. sleep study 5/16: No OSA  . SOB (shortness of breath) 07/31/2015  . Spinal stenosis   . Spondylosis   . Suicide attempt ([place]) 08/27/2016  . Type II diabetes mellitus ([place])    "dx'd in 2007; lost weight; no RX for ~ 4 yr now" (09/19/2014)  . Wheezing 02/25/2017   Surgical Hx:  Past Surgical History:  Procedure Laterality Date  . ABDOMINAL HYSTERECTOMY  1985  . ANTERIOR CERVICAL DECOMP/DISCECTOMY FUSION  10/2012   in [place] C5-C7   . APPENDECTOMY  1985  . BACK SURGERY    . BREAST CYST EXCISION Right   . BREAST EXCISIONAL BIOPSY Right YRS AGO   NEG  . CARDIAC CATHETERIZATION   2000's; 2009  . CHOLECYSTECTOMY  2002  . COLONOSCOPY WITH PROPOFOL N/A 12/12/2016   Procedure: COLONOSCOPY WITH PROPOFOL;  Surgeon: [name], MD;  Location: [facility] ENDOSCOPY;  Service: Endoscopy;  Laterality: N/A;  . COLONOSCOPY WITH PROPOFOL N/A 02/17/2017   Procedure: COLONOSCOPY WITH PROPOFOL;  Surgeon: [name], MD;  Location: [facility] ENDOSCOPY;  Service: Gastroenterology;  Laterality: N/A;  . ESOPHAGOGASTRODUODENOSCOPY (EGD) WITH PROPOFOL N/A 02/17/2017   Procedure:  ESOPHAGOGASTRODUODENOSCOPY (EGD) WITH PROPOFOL;  Surgeon: [name], MD;  Location: [facility] ENDOSCOPY;  Service: Gastroenterology;  Laterality: N/A;  . EXCISION/RELEASE BURSA HIP Right 12/1984  . HERNIA REPAIR  1985  . LEFT HEART CATHETERIZATION WITH CORONARY ANGIOGRAM N/A 12/09/2014   Procedure: LEFT HEART CATHETERIZATION WITH CORONARY ANGIOGRAM;  Surgeon: Burnell Blanks, MD;  Location: [facility] CATH LAB;  Service: Cardiovascular;  Laterality: N/A;  . TONSILLECTOMY AND ADENOIDECTOMY  ~ 1968  . TOTAL HIP ARTHROPLASTY Left 04-06-2014  . UMBILICAL HERNIA REPAIR  1985  . VENA CAVA FILTER PLACEMENT  03/2014   Family Hx:  Family History  Problem Relation Age of Onset  . Stroke Mother        Deceased  . Lung cancer Father        Deceased  . Healthy Daughter   . Healthy Son        x 2  . Other Son        Suicide  . Cancer Brother   .  Heart attack Neg Hx    Social Hx:   Social History   Tobacco Use  . Smoking status: Former Smoker    Packs/day: 1.50    Years: 10.00    Pack years: 15.00    Types: Cigarettes    Quit date: 04/12/1979    Years since quitting: 39.9  . Smokeless tobacco: Never Used  Substance Use Topics  . Alcohol use: No    Alcohol/week: 0.0 standard drinks  . Drug use: No   Medication:    Current Outpatient Medications:  .  acetaminophen (TYLENOL) 500 MG tablet, Take 1 tablet (500 mg total) by mouth every 6 (six) hours as needed., Disp: 30 tablet, Rfl: 0 .  carvedilol (COREG) 3.125 MG tablet, Take 1 tablet (3.125 mg total) by mouth 2 (two) times daily with a meal., Disp: 60 tablet, Rfl: 11 .  cetirizine (ZYRTEC) 10 MG tablet, Take 1 tablet (10 mg total) by mouth daily., Disp: 30 tablet, Rfl: 11 .  cyclobenzaprine (FLEXERIL) 10 MG tablet, TAKE 1 TABLET BY MOUTH THREE TIMES A DAY, Disp: 30 tablet, Rfl: 1 .  diclofenac sodium (VOLTAREN) 1 % GEL, Apply 2 g topically 4 (four) times daily. Right arm/shoulder arthritis (Patient not taking: Reported on 02/24/2019), Disp: 1  Tube, Rfl: 2 .  dicyclomine (BENTYL) 20 MG tablet, TAKE 1 TABLET BY MOUTH 3 TIMES A DAY AS NEEDED FOR SPASMS, Disp: 270 tablet, Rfl: 0 .  diphenhydrAMINE (BENADRYL) 25 MG tablet, Take 1 tablet (25 mg total) by mouth every 6 (six) hours., Disp: 12 tablet, Rfl: 0 .  ELIQUIS 5 MG TABS tablet, TAKE 1 TABLET (5 MG TOTAL) BY MOUTH 2 (TWO) TIMES DAILY., Disp: 60 tablet, Rfl: 5 .  escitalopram (LEXAPRO) 20 MG tablet, Take 1 tablet (20 mg total) by mouth daily., Disp: 90 tablet, Rfl: 1 .  famotidine (PEPCID) 20 MG tablet, Take 1 tablet (20 mg total) by mouth at bedtime., Disp: 30 tablet, Rfl: 2 .  furosemide (LASIX) 20 MG tablet, Take 1 tablet (20 mg total) by mouth daily as needed for edema., Disp: 30 tablet, Rfl: 3 .  gabapentin (NEURONTIN) 600 MG tablet, Take 1-1.5 tablets (600-900 mg total) by mouth 2 (two) times daily. Take 1 tablet in the AM and 1.5 tablet at bedtime, Disp: 225 tablet, Rfl: 1 .  isosorbide mononitrate (IMDUR) 30 MG 24 hr tablet, TAKE 1.5 TABLETS (45 MG TOTAL) BY MOUTH DAILY., Disp: 135 tablet, Rfl: 1 .  JANUVIA 100 MG tablet, TAKE 1 TABLET BY MOUTH EVERY DAY, Disp: 90 tablet, Rfl: 1 .  levothyroxine (SYNTHROID) 25 MCG tablet, TAKE 1 TABLET BY MOUTH BEFORE BREAKFAST 2-3 HOURS BEFORE TAKING ANY OTHER MEDICATION OR SUPPLEMENT, Disp: 90 tablet, Rfl: 0 .  lidocaine (LIDODERM) 5 %, Place 1 patch onto the skin daily. Remove & Discard patch within 12 hours., Disp: 30 patch, Rfl: 0 .  Multiple Vitamins-Minerals (SUPER THERA VITE M) TABS, Take by mouth., Disp: , Rfl:  .  NIFEdipine (PROCARDIA) 10 MG capsule, TAKE 1 CAPSULE BY MOUTH 2 TIMES DAILY. TAKE 30 MINS BEFORE MEALS UP TO 1X PER DAY, Disp: 180 capsule, Rfl: 1 .  nitroGLYCERIN (NITROSTAT) 0.4 MG SL tablet, Place 1 tablet (0.4 mg total) under the tongue every 5 (five) minutes as needed for chest pain., Disp: 25 tablet, Rfl: 6 .  OLANZapine (ZYPREXA) 10 MG tablet, Take 1 tablet (10 mg total) by mouth at bedtime., Disp: 90 tablet, Rfl: 0 .   ondansetron (ZOFRAN) 4 MG tablet, Take 1  tablet (4 mg total) by mouth 2 (two) times daily as needed for nausea or vomiting., Disp: 30 tablet, Rfl: 0 .  oxyCODONE-acetaminophen (PERCOCET) 5-325 MG tablet, Take 1 tablet by mouth 2 (two) times daily as needed for severe pain., Disp: 10 tablet, Rfl: 0 .  pantoprazole (PROTONIX) 40 MG tablet, TAKE 1 TABLET BY MOUTH EVERY DAY, Disp: 60 tablet, Rfl: 1 .  potassium chloride SA (K-DUR) 20 MEQ tablet, Take 1 tablet (20 mEq total) by mouth daily as needed (take with lasix)., Disp: 30 tablet, Rfl: 3 .  pravastatin (PRAVACHOL) 10 MG tablet, TAKE 1 TABLET BY MOUTH EVERYDAY AT BEDTIME, Disp: 90 tablet, Rfl: 1   Allergies:  Abilify [aripiprazole], Augmentin [amoxicillin-pot clavulanate], Bactrim [sulfamethoxazole-trimethoprim], Ceftin [cefuroxime axetil], Coconut oil, Diclofenac, Dye fdc red [red dye], Indocin [indomethacin], Linaclotide, Lisinopril, Morphine, Morphine and related, Simvastatin, Sulfa antibiotics, Sulfamethoxazole-trimethoprim, Wellbutrin [bupropion], Zetia [ezetimibe], Oxycodone-acetaminophen, Quetiapine fumarate, and Quetiapine fumarate  Review of Systems: Gen:  Denies  fever, sweats, chills HEENT: Denies blurred vision, double vision. bleeds, sore throat Cvc:  No dizziness, chest pain. Resp:   Denies cough or sputum production, shortness of breath Gi: Denies swallowing difficulty, stomach pain. Gu:  Denies bladder incontinence, burning urine Ext:   No Joint pain, stiffness. Skin: No skin rash,  hives  Endoc:  No polyuria, polydipsia. Psych: No depression, insomnia. Other:  All other systems were reviewed with the patient and were negative other that what is mentioned in the HPI.   Physical Examination:   VS: There were no vitals taken for this visit.  General Appearance: No distress  Neuro:without focal findings,  speech normal,  HEENT: PERRLA, EOM intact.   Pulmonary: normal breath sounds, No wheezing.  CardiovascularNormal S1,S2.   No m/r/g.   Abdomen: Benign, Soft, non-tender. Renal:  No costovertebral tenderness  GU:  No performed at this time. Endoc: No evident thyromegaly, no signs of acromegaly. Skin:   warm, no rashes, no ecchymosis  Extremities: normal, no cyanosis, clubbing.  Other findings:    LABORATORY PANEL:   CBC No results for input(s): WBC, HGB, HCT, PLT in the last 168 hours. ------------------------------------------------------------------------------------------------------------------  Chemistries  Recent Labs  Lab 02/24/19 0850  NA 138  K 4.0  CL 105  CO2 19  GLUCOSE 205*  BUN 10  CREATININE 0.98  CALCIUM 9.1   ------------------------------------------------------------------------------------------------------------------  Cardiac Enzymes No results for input(s): TROPONINI in the last 168 hours. ------------------------------------------------------------  RADIOLOGY:  No results found.     Thank  you for the consultation and for allowing [facility] to assist in the care of your patient. Our recommendations are noted above.  Please contact us if we can be of further service.   Marda Stalker, M.D., F.C.C.P.  Board Certified in Internal Medicine, Pulmonary Medicine, [place], and Sleep Medicine.  [facility] Office Number: [phone]   02/25/2019

## 2019-02-25 ENCOUNTER — Telehealth: Payer: Self-pay | Admitting: Internal Medicine

## 2019-02-25 ENCOUNTER — Encounter: Payer: Self-pay | Admitting: Obstetrics and Gynecology

## 2019-02-25 ENCOUNTER — Ambulatory Visit (INDEPENDENT_AMBULATORY_CARE_PROVIDER_SITE_OTHER): Payer: Medicare HMO | Admitting: Internal Medicine

## 2019-02-25 DIAGNOSIS — M62562 Muscle wasting and atrophy, not elsewhere classified, left lower leg: Secondary | ICD-10-CM | POA: Diagnosis not present

## 2019-02-25 DIAGNOSIS — J90 Pleural effusion, not elsewhere classified: Secondary | ICD-10-CM

## 2019-02-25 DIAGNOSIS — Z86711 Personal history of pulmonary embolism: Secondary | ICD-10-CM | POA: Diagnosis not present

## 2019-02-25 DIAGNOSIS — I5032 Chronic diastolic (congestive) heart failure: Secondary | ICD-10-CM

## 2019-02-25 DIAGNOSIS — R0609 Other forms of dyspnea: Secondary | ICD-10-CM | POA: Diagnosis not present

## 2019-02-25 DIAGNOSIS — M5134 Other intervertebral disc degeneration, thoracic region: Secondary | ICD-10-CM | POA: Diagnosis not present

## 2019-02-25 DIAGNOSIS — M62561 Muscle wasting and atrophy, not elsewhere classified, right lower leg: Secondary | ICD-10-CM | POA: Diagnosis not present

## 2019-02-25 DIAGNOSIS — S335XXA Sprain of ligaments of lumbar spine, initial encounter: Secondary | ICD-10-CM | POA: Diagnosis not present

## 2019-02-25 NOTE — Telephone Encounter (Signed)
Left message for pt to schedule 6 wk rov.

## 2019-02-25 NOTE — Telephone Encounter (Signed)
Pt called back and scheduled f.u. Nothing further needed

## 2019-02-25 NOTE — Telephone Encounter (Signed)
Patient is looking for her form from Dr. Georgeann Oppenheim office, have you completed the form?  Delmer Kowalski,cma

## 2019-02-25 NOTE — Patient Instructions (Signed)
We will check echocardiogram, follow-up afterwards.

## 2019-02-27 NOTE — Telephone Encounter (Signed)
Completed.  Please fax to GI.

## 2019-02-28 LAB — INSULIN-LIKE GROWTH FACTOR
IGF-I, LC/MS: 55 ng/mL (ref 41–279)
Z-Score (Female): -1.5 SD (ref ?–2.0)

## 2019-03-01 ENCOUNTER — Other Ambulatory Visit: Payer: Self-pay

## 2019-03-01 DIAGNOSIS — R109 Unspecified abdominal pain: Secondary | ICD-10-CM

## 2019-03-01 NOTE — Telephone Encounter (Signed)
Spoke with pt and informed her that we have received the blood thinner holding instructions from Dr. Caryl Bis. Pt is aware she needs to hold the Eliquis 48 hours prior to her procedure, pt agrees. Pt procedure has been scheduled.

## 2019-03-02 ENCOUNTER — Encounter: Payer: Self-pay | Admitting: Family Medicine

## 2019-03-03 ENCOUNTER — Telehealth: Payer: Self-pay | Admitting: Family Medicine

## 2019-03-03 ENCOUNTER — Other Ambulatory Visit: Payer: Self-pay | Admitting: Psychiatry

## 2019-03-03 ENCOUNTER — Other Ambulatory Visit
Admission: RE | Admit: 2019-03-03 | Discharge: 2019-03-03 | Disposition: A | Discharge status: Home / Self Care | Payer: Medicare HMO | Source: Ambulatory Visit | Attending: Gastroenterology | Admitting: Gastroenterology

## 2019-03-03 ENCOUNTER — Other Ambulatory Visit: Payer: Self-pay | Admitting: Family Medicine

## 2019-03-03 ENCOUNTER — Other Ambulatory Visit: Payer: Self-pay

## 2019-03-03 DIAGNOSIS — Z1159 Encounter for screening for other viral diseases: Secondary | ICD-10-CM | POA: Diagnosis not present

## 2019-03-03 DIAGNOSIS — R110 Nausea: Secondary | ICD-10-CM

## 2019-03-03 DIAGNOSIS — Z01812 Encounter for preprocedural laboratory examination: Secondary | ICD-10-CM | POA: Diagnosis not present

## 2019-03-03 DIAGNOSIS — F250 Schizoaffective disorder, bipolar type: Secondary | ICD-10-CM

## 2019-03-03 LAB — SARS CORONAVIRUS 2 (TAT 6-24 HRS): SARS Coronavirus 2: NEGATIVE

## 2019-03-03 MED ORDER — ENOXAPARIN SODIUM 100 MG/ML ~~LOC~~ SOLN: 100.0000 mg | Freq: Two times a day (BID) | SUBCUTANEOUS | 0 refills | Status: DC

## 2019-03-03 NOTE — Telephone Encounter (Signed)
Received a mychart message from the patient regarding lovenox for bridging prior to her procedure next week. I reviewed a prior message from hematology and we will follow their recommendations for bridging. I will forward to Dr Vicente Males and his CMA so they are aware of the new recommendations. Lovenox sent to pharmacy and message sent to patient with recommendations. My CMA to call patient to review this with her.    1. Stop Eliquis 3 days prior to procedure. 2. Restart Eliquis 1 day after procedure 3. Bridge with Lovenox injections; start 48 hours prior to surgery. Hold pm dose the night prior to surgery. Restart Lovenox night after surgery x 48 hours.

## 2019-03-03 NOTE — Telephone Encounter (Signed)
Please see other mychart message

## 2019-03-07 ENCOUNTER — Other Ambulatory Visit: Payer: Self-pay

## 2019-03-07 ENCOUNTER — Ambulatory Visit: Payer: Medicare HMO | Admitting: Anesthesiology

## 2019-03-07 ENCOUNTER — Encounter
Admission: RE | Disposition: A | Discharge status: Home / Self Care | Payer: Self-pay | Source: Home / Self Care | Attending: Gastroenterology

## 2019-03-07 ENCOUNTER — Ambulatory Visit
Admission: RE | Admit: 2019-03-07 | Discharge: 2019-03-07 | Disposition: A | Discharge status: Home / Self Care | Payer: Medicare HMO | Attending: Gastroenterology | Admitting: Gastroenterology

## 2019-03-07 DIAGNOSIS — F4310 Post-traumatic stress disorder, unspecified: Secondary | ICD-10-CM | POA: Insufficient documentation

## 2019-03-07 DIAGNOSIS — Z86711 Personal history of pulmonary embolism: Secondary | ICD-10-CM | POA: Insufficient documentation

## 2019-03-07 DIAGNOSIS — R109 Unspecified abdominal pain: Secondary | ICD-10-CM | POA: Insufficient documentation

## 2019-03-07 DIAGNOSIS — M1990 Unspecified osteoarthritis, unspecified site: Secondary | ICD-10-CM | POA: Insufficient documentation

## 2019-03-07 DIAGNOSIS — E119 Type 2 diabetes mellitus without complications: Secondary | ICD-10-CM | POA: Diagnosis not present

## 2019-03-07 DIAGNOSIS — I10 Essential (primary) hypertension: Secondary | ICD-10-CM | POA: Diagnosis not present

## 2019-03-07 DIAGNOSIS — Z87891 Personal history of nicotine dependence: Secondary | ICD-10-CM | POA: Diagnosis not present

## 2019-03-07 DIAGNOSIS — I2510 Atherosclerotic heart disease of native coronary artery without angina pectoris: Secondary | ICD-10-CM | POA: Insufficient documentation

## 2019-03-07 DIAGNOSIS — D649 Anemia, unspecified: Secondary | ICD-10-CM | POA: Diagnosis not present

## 2019-03-07 DIAGNOSIS — I252 Old myocardial infarction: Secondary | ICD-10-CM | POA: Diagnosis not present

## 2019-03-07 DIAGNOSIS — F250 Schizoaffective disorder, bipolar type: Secondary | ICD-10-CM

## 2019-03-07 DIAGNOSIS — Z86718 Personal history of other venous thrombosis and embolism: Secondary | ICD-10-CM | POA: Diagnosis not present

## 2019-03-07 DIAGNOSIS — K2960 Other gastritis without bleeding: Secondary | ICD-10-CM | POA: Diagnosis not present

## 2019-03-07 DIAGNOSIS — G8929 Other chronic pain: Secondary | ICD-10-CM | POA: Insufficient documentation

## 2019-03-07 DIAGNOSIS — Z96642 Presence of left artificial hip joint: Secondary | ICD-10-CM | POA: Diagnosis not present

## 2019-03-07 DIAGNOSIS — K2950 Unspecified chronic gastritis without bleeding: Secondary | ICD-10-CM | POA: Diagnosis not present

## 2019-03-07 DIAGNOSIS — Z79890 Hormone replacement therapy (postmenopausal): Secondary | ICD-10-CM | POA: Insufficient documentation

## 2019-03-07 DIAGNOSIS — G4730 Sleep apnea, unspecified: Secondary | ICD-10-CM | POA: Diagnosis not present

## 2019-03-07 DIAGNOSIS — E039 Hypothyroidism, unspecified: Secondary | ICD-10-CM | POA: Insufficient documentation

## 2019-03-07 DIAGNOSIS — E7800 Pure hypercholesterolemia, unspecified: Secondary | ICD-10-CM | POA: Diagnosis not present

## 2019-03-07 DIAGNOSIS — Z79899 Other long term (current) drug therapy: Secondary | ICD-10-CM | POA: Diagnosis not present

## 2019-03-07 DIAGNOSIS — K219 Gastro-esophageal reflux disease without esophagitis: Secondary | ICD-10-CM | POA: Insufficient documentation

## 2019-03-07 DIAGNOSIS — Z95828 Presence of other vascular implants and grafts: Secondary | ICD-10-CM | POA: Diagnosis not present

## 2019-03-07 HISTORY — PX: ESOPHAGOGASTRODUODENOSCOPY (EGD) WITH PROPOFOL: SHX5813

## 2019-03-07 LAB — GLUCOSE, CAPILLARY: Glucose-Capillary: 189 mg/dL — ABNORMAL HIGH (ref 70–99)

## 2019-03-07 SURGERY — ESOPHAGOGASTRODUODENOSCOPY (EGD) WITH PROPOFOL
Anesthesia: General

## 2019-03-07 MED ORDER — PROPOFOL 10 MG/ML IV BOLUS
INTRAVENOUS | Status: DC | PRN
  Administered 2019-03-07: 30 mg via INTRAVENOUS
  Administered 2019-03-07: 70 mg via INTRAVENOUS
  Administered 2019-03-07: 30 mg via INTRAVENOUS

## 2019-03-07 MED ORDER — PROPOFOL 500 MG/50ML IV EMUL
INTRAVENOUS | Status: DC | PRN
  Administered 2019-03-07: 140 ug/kg/min via INTRAVENOUS

## 2019-03-07 MED ORDER — ESCITALOPRAM OXALATE 20 MG PO TABS: 20.0000 mg | ORAL_TABLET | Freq: Every day | ORAL | 1 refills | Status: DC

## 2019-03-07 MED ORDER — LIDOCAINE HCL (CARDIAC) PF 100 MG/5ML IV SOSY
PREFILLED_SYRINGE | INTRAVENOUS | Status: DC | PRN
  Administered 2019-03-07: 60 mg via INTRAVENOUS

## 2019-03-07 MED ORDER — SODIUM CHLORIDE 0.9 % IV SOLN
INTRAVENOUS | Status: DC
  Administered 2019-03-07: 1000 mL via INTRAVENOUS

## 2019-03-07 MED ORDER — GLYCOPYRROLATE 0.2 MG/ML IJ SOLN
INTRAMUSCULAR | Status: DC | PRN
  Administered 2019-03-07: 0.2 mg via INTRAVENOUS

## 2019-03-07 NOTE — Anesthesia Preprocedure Evaluation (Signed)
Anesthesia Evaluation  Patient identified by MRN, date of birth, ID band Patient awake    Reviewed: Allergy & Precautions, H&P , NPO status , Patient's Chart, lab work & pertinent test results, reviewed documented beta blocker date and time   Airway Mallampati: II   Neck ROM: full    Dental  (+) Poor Dentition, Teeth Intact   Pulmonary sleep apnea , pneumonia, former smoker,    Pulmonary exam normal        Cardiovascular Exercise Tolerance: Good hypertension, On Medications + CAD and + Past MI  Normal cardiovascular exam Rhythm:regular Rate:Normal     Neuro/Psych  Headaches, PSYCHIATRIC DISORDERS Anxiety Depression Schizophrenia  Neuromuscular disease    GI/Hepatic Neg liver ROS, GERD  ,  Endo/Other  diabetesHypothyroidism   Renal/GU CRFRenal disease  negative genitourinary   Musculoskeletal   Abdominal   Peds  Hematology  (+) Blood dyscrasia, anemia ,   Anesthesia Other Findings Past Medical History: 10/23/2017: Acute right-sided low back pain with right-sided sciatica No date: Anemia No date: Bilateral renal cysts 09/28/2016: CAD (coronary artery disease) 10/14/2015: Chest pain No date: Chronic back pain     Comment:  "upper and lower" (09/19/2014) 01/29/2016: Chronic shoulder pain (Location of Primary Source of Pain)  (Left) 04/08/2016: Colitis 12/09/2014: Coronary vasospasm ([facility]) No date: Depression     Comment:  "major" (09/19/2014) No date: DVT (deep venous thrombosis) ([facility]) No date: Esophageal spasm 10/28/2013: Essential hypertension No date: Gallstones No date: Gastritis No date: Gastroenteritis No date: GERD (gastroesophageal reflux disease) No date: Headache     Comment:  "couple /month lately" (09/19/2014) 12/09/2016: Heart disease No date: High blood pressure No date: High cholesterol 1985: History of blood transfusion     Comment:  related to hysterectomy No date: History of nuclear stress test   Comment:  Myoview 1/19:  EF 65, no ischemia or scar 09/19/2014: History of pulmonary embolus (PE) 02/13/2015: Hypercementosis     Comment:  Per patient diagnosed in [place]. Rule out Paget's               disease of the bone with blood work.  10/28/2013: Hyperlipidemia No date: Hypertension No date: IBS (irritable bowel syndrome) 01/29/2016: Lumbar central spinal stenosis (moderate at L4-5; mild at  L2-3 and L3-4) No date: Migraine     Comment:  "a few times/yr" (09/19/2014) 10/2010 X 3: Myocardial infarction [facility])     Comment:  "while hospitalized" 01/29/2016: Neck pain No date: Osteoarthritis     Comment:  "qwhere; mostly around my joints" (09/19/2014) 07/31/2015: Pleural effusion, bilateral 04/2014: Pneumonia No date: PTSD (post-traumatic stress disorder) 04/2001; 09/19/2014: Pulmonary embolism ([place])     Comment:  "after gallbladder OR; " No date: Schizoaffective disorder ([place]) 08/28/2016: Schizoaffective disorder, depressive type ([place]) No date: Sleep apnea     Comment:  "mild" (09/19/2014) No date: Snoring     Comment:  a. sleep study 5/16: No OSA 07/31/2015: SOB (shortness of breath) No date: Spinal stenosis No date: Spondylosis 08/27/2016: Suicide attempt ([place]) No date: Type II diabetes mellitus ([place])     Comment:  "dx'd in 2007; lost weight; no RX for ~ 4 yr now"               (09/19/2014) 02/25/2017: Wheezing Past Surgical History: 1985: ABDOMINAL HYSTERECTOMY 10/2012: ANTERIOR CERVICAL DECOMP/DISCECTOMY FUSION     Comment:  in [place] C5-C7  1985: APPENDECTOMY No date: BACK SURGERY No date: BREAST CYST EXCISION; Right YRS AGO: BREAST EXCISIONAL BIOPSY; Right  Comment:  NEG  2000's; 2009: CARDIAC CATHETERIZATION 2002: CHOLECYSTECTOMY 12/12/2016: COLONOSCOPY WITH PROPOFOL; N/A     Comment:  Procedure: COLONOSCOPY WITH PROPOFOL;  Surgeon: [name], MD;  Location: [facility] ENDOSCOPY;  Service: Endoscopy;              Laterality: N/A; 02/17/2017: COLONOSCOPY  WITH PROPOFOL; N/A     Comment:  Procedure: COLONOSCOPY WITH PROPOFOL;  Surgeon: [name], MD;  Location: [facility] ENDOSCOPY;  Service:               Gastroenterology;  Laterality: N/A; 02/17/2017: ESOPHAGOGASTRODUODENOSCOPY (EGD) WITH PROPOFOL; N/A     Comment:  Procedure: ESOPHAGOGASTRODUODENOSCOPY (EGD) WITH               PROPOFOL;  Surgeon: [name], MD;  Location: [facility]               ENDOSCOPY;  Service: Gastroenterology;  Laterality: N/A; 12/1984: EXCISION/RELEASE BURSA HIP; Right 1985: HERNIA REPAIR 12/09/2014: LEFT HEART CATHETERIZATION WITH CORONARY ANGIOGRAM; N/A     Comment:  Procedure: LEFT HEART CATHETERIZATION WITH CORONARY               ANGIOGRAM;  Surgeon: Burnell Blanks, MD;                Location: [facility] CATH LAB;  Service: Cardiovascular;                Laterality: N/A; ~ 1968: TONSILLECTOMY AND ADENOIDECTOMY 04-06-2014: TOTAL HIP ARTHROPLASTY; Left 2563: UMBILICAL HERNIA REPAIR 04/9372: VENA CAVA FILTER PLACEMENT BMI    Body Mass Index: 37.12 kg/m     Reproductive/Obstetrics negative OB ROS                             Anesthesia Physical Anesthesia Plan  ASA: III  Anesthesia Plan: General   Post-op Pain Management:    Induction:   PONV Risk Score and Plan:   Airway Management Planned:   Additional Equipment:   Intra-op Plan:   Post-operative Plan:   Informed Consent: I have reviewed the patients History and Physical, chart, labs and discussed the procedure including the risks, benefits and alternatives for the proposed anesthesia with the patient or authorized representative who has indicated his/her understanding and acceptance.     Dental Advisory Given  Plan Discussed with: CRNA  Anesthesia Plan Comments:         Anesthesia Quick Evaluation

## 2019-03-07 NOTE — Op Note (Signed)
[facility] Gastroenterology Patient Name: Valerie Vazquez Procedure Date: 03/07/2019 9:37 AM MRN: [id number] Account #: [id number] Date of Birth: 1956/02/23 Admit Type: Outpatient Age: 63 Room: [facility] ENDO ROOM 4 Gender: Female Note Status: Finalized Procedure:            Upper GI endoscopy Indications:          Abdominal pain Providers:            [name] MD, MD Referring MD:         Angela Adam. Caryl Bis (Referring MD) Medicines:            Monitored Anesthesia Care Complications:        No immediate complications. Procedure:            Pre-Anesthesia Assessment:                       - Prior to the procedure, a History and Physical was                        performed, and patient medications, allergies and                        sensitivities were reviewed. The patient's tolerance of                        previous anesthesia was reviewed.                       - The risks and benefits of the procedure and the                        sedation options and risks were discussed with the                        patient. All questions were answered and informed                        consent was obtained.                       - ASA Grade Assessment: II - A patient with mild                        systemic disease.                       After obtaining informed consent, the endoscope was                        passed under direct vision. Throughout the procedure,                        the patient's blood pressure, pulse, and oxygen                        saturations were monitored continuously. The Endoscope                        was introduced through the mouth, and advanced to the  third part of duodenum. The upper GI endoscopy was                        accomplished with ease. The patient tolerated the                        procedure well. Findings:      The examined duodenum was normal.      The esophagus was normal.      The cardia and  gastric fundus were normal on retroflexion.      The entire examined stomach was normal. Biopsies were taken with a cold       forceps for histology. Impression:           - Normal examined duodenum.                       - Normal esophagus.                       - Normal stomach. Biopsied. Recommendation:       - Await pathology results.                       - Discharge patient to home (with escort).                       - Resume previous diet.                       - Continue present medications.                       - Await pathology results.                       - Return to my office in 4 weeks. Procedure Code(s):    --- Professional ---                       [phone], Esophagogastroduodenoscopy, flexible, transoral;                        with biopsy, single or multiple Diagnosis Code(s):    --- Professional ---                       R10.9, Unspecified abdominal pain CPT copyright 2019 American Medical Association. All rights reserved. The codes documented in this report are preliminary and upon coder review may  be revised to meet current compliance requirements. [name], MD [name] MD, MD 03/07/2019 9:53:12 AM This report has been signed electronically. Number of Addenda: 0 Note Initiated On: 03/07/2019 9:37 AM Estimated Blood Loss: Estimated blood loss: none.      [facility]

## 2019-03-07 NOTE — Transfer of Care (Signed)
Immediate Anesthesia Transfer of Care Note  Patient: Valerie Vazquez  Procedure(s) Performed: ESOPHAGOGASTRODUODENOSCOPY (EGD) WITH PROPOFOL (N/A )  Patient Location: PACU  Anesthesia Type:General  Level of Consciousness: sedated  Airway & Oxygen Therapy: Patient Spontanous Breathing and Patient connected to nasal cannula oxygen  Post-op Assessment: Report given to RN and Post -op Vital signs reviewed and stable  Post vital signs: Reviewed and stable  Last Vitals:  Vitals Value Taken Time  BP 115/75 03/07/19 0958  Temp    Pulse 90 03/07/19 0958  Resp 12 03/07/19 0958  SpO2 97 % 03/07/19 0958    Last Pain:  Vitals:   03/07/19 0958  TempSrc: (P) Tympanic  PainSc:          Complications: No apparent anesthesia complications

## 2019-03-07 NOTE — Anesthesia Post-op Follow-up Note (Signed)
Anesthesia QCDR form completed.        

## 2019-03-07 NOTE — Telephone Encounter (Signed)
Pt needs a refill on escitalopram (LEXAPRO) 20 MG tablet And  ondansetron (ZOFRAN) 4 MG tablet To go to   [id number] [name], [place] [phone] (Phone) [phone] (Fax)

## 2019-03-07 NOTE — H&P (Signed)
[name], MD [address], [place], [place], [place], [address] [address], [place], [place], [place], [address] Phone: [phone]  Fax: [phone]  Primary Care Physician:  [name], MD   Pre-Procedure History & Physical: HPI:  Valerie Vazquez is a 63 y.o. female is here for an endoscopy    Past Medical History:  Diagnosis Date  . Acute right-sided low back pain with right-sided sciatica 10/23/2017  . Anemia   . Bilateral renal cysts   . CAD (coronary artery disease) 09/28/2016  . Chest pain 10/14/2015  . Chronic back pain    "upper and lower" (09/19/2014)  . Chronic shoulder pain (Location of Primary Source of Pain) (Left) 01/29/2016  . Colitis 04/08/2016  . Coronary vasospasm ([place]) 12/09/2014  . Depression    "major" (09/19/2014)  . DVT (deep venous thrombosis) ([place])   . Esophageal spasm   . Essential hypertension 10/28/2013  . Gallstones   . Gastritis   . Gastroenteritis   . GERD (gastroesophageal reflux disease)   . Headache    "couple /month lately" (09/19/2014)  . Heart disease 12/09/2016  . High blood pressure   . High cholesterol   . History of blood transfusion 1985   related to hysterectomy  . History of nuclear stress test    Myoview 1/19:  EF 65, no ischemia or scar  . History of pulmonary embolus (PE) 09/19/2014  . 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 Rule out Paget's disease of the bone with blood work.   . Hyperlipidemia 10/28/2013  . Hypertension   . IBS (irritable bowel syndrome)   . Lumbar central spinal stenosis (moderate at L4-5; mild at L2-3 and L3-4) 01/29/2016  . Migraine    "a few times/yr" (09/19/2014)  . Myocardial infarction ([place]) 10/2010 X 3   "while hospitalized"  . Neck pain 01/29/2016  . Osteoarthritis    "qwhere; mostly around my joints" (09/19/2014)  . Pleural effusion, bilateral 07/31/2015  . Pneumonia 04/2014  . PTSD (post-traumatic stress disorder)   . Pulmonary embolism ([place]) 04/2001;  09/19/2014   "after gallbladder OR; "  . Schizoaffective disorder ([place])   . Schizoaffective disorder, depressive type ([place]) 08/28/2016  . Sleep apnea    "mild" (09/19/2014)  . Snoring    a. sleep study 5/16: No OSA  . SOB (shortness of breath) 07/31/2015  . Spinal stenosis   . Spondylosis   . Suicide attempt ([place]) 08/27/2016  . Type II diabetes mellitus ([place])    "dx'd in 2007; lost weight; no RX for ~ 4 yr now" (09/19/2014)  . Wheezing 02/25/2017    Past Surgical History:  Procedure Laterality Date  . ABDOMINAL HYSTERECTOMY  1985  . ANTERIOR CERVICAL DECOMP/DISCECTOMY FUSION  10/2012   in [place] C5-C7   . APPENDECTOMY  1985  . BACK SURGERY    . BREAST CYST EXCISION Right   . BREAST EXCISIONAL BIOPSY Right YRS AGO   NEG  . CARDIAC CATHETERIZATION   2000's; 2009  . CHOLECYSTECTOMY  2002  . COLONOSCOPY WITH PROPOFOL N/A 12/12/2016   Procedure: COLONOSCOPY WITH PROPOFOL;  Surgeon: [name], MD;  Location: [facility] ENDOSCOPY;  Service: Endoscopy;  Laterality: N/A;  . COLONOSCOPY WITH PROPOFOL N/A 02/17/2017   Procedure: COLONOSCOPY WITH PROPOFOL;  Surgeon: [name], MD;  Location: [facility] ENDOSCOPY;  Service: Gastroenterology;  Laterality: N/A;  . ESOPHAGOGASTRODUODENOSCOPY (EGD) WITH PROPOFOL N/A 02/17/2017   Procedure: ESOPHAGOGASTRODUODENOSCOPY (EGD) WITH PROPOFOL;  Surgeon: [name], MD;  Location: [facility] ENDOSCOPY;  Service: Gastroenterology;  Laterality: N/A;  . EXCISION/RELEASE BURSA HIP Right 12/1984  . HERNIA REPAIR  1985  . LEFT HEART CATHETERIZATION WITH CORONARY ANGIOGRAM N/A 12/09/2014   Procedure: LEFT HEART CATHETERIZATION WITH CORONARY ANGIOGRAM;  Surgeon: Burnell Blanks, MD;  Location: [facility] CATH LAB;  Service: Cardiovascular;  Laterality: N/A;  . TONSILLECTOMY AND ADENOIDECTOMY  ~ 1968  . TOTAL HIP ARTHROPLASTY Left 04-06-2014  . UMBILICAL HERNIA REPAIR  1985  . VENA CAVA FILTER PLACEMENT  03/2014    Prior to Admission medications   Medication Sig Start Date End  Date Taking? Authorizing Provider  acetaminophen (TYLENOL) 500 MG tablet Take 1 tablet (500 mg total) by mouth every 6 (six) hours as needed. 09/29/18  Yes Laban Emperor, PA-C  carvedilol (COREG) 3.125 MG tablet Take 1 tablet (3.125 mg total) by mouth 2 (two) times daily with a meal. 03/08/18  Yes Burnell Blanks, MD  cetirizine (ZYRTEC) 10 MG tablet Take 1 tablet (10 mg total) by mouth daily. 01/25/19  Yes [name], MD  cyclobenzaprine (FLEXERIL) 10 MG tablet TAKE 1 TABLET BY MOUTH THREE TIMES A DAY 02/19/19  Yes [name], MD  diclofenac sodium (VOLTAREN) 1 % GEL Apply 2 g topically 4 (four) times daily. Right arm/shoulder arthritis 04/08/18  Yes McLean-Scocuzza, Nino Glow, MD  dicyclomine (BENTYL) 20 MG tablet TAKE 1 TABLET BY MOUTH 3 TIMES A DAY AS NEEDED FOR SPASMS 12/21/18  Yes [name], MD  diphenhydrAMINE (BENADRYL) 25 MG tablet Take 1 tablet (25 mg total) by mouth every 6 (six) hours. 09/12/16  Yes Jola Schmidt, MD  ELIQUIS 5 MG TABS tablet TAKE 1 TABLET (5 MG TOTAL) BY MOUTH 2 (TWO) TIMES DAILY. 10/11/18  Yes [name], MD  enoxaparin (LOVENOX) 100 MG/ML injection Inject 1 mL (100 mg total) into the skin every 12 (twelve) hours. See instructions provided on Mychart. 03/03/19  Yes [name], MD  escitalopram (LEXAPRO) 20 MG tablet Take 1 tablet (20 mg total) by mouth daily. 07/01/18  Yes Ursula Alert, MD  famotidine (PEPCID) 20 MG tablet Take 1 tablet (20 mg total) by mouth at bedtime. 02/09/19  Yes Guse, Jacquelynn Cree, FNP  furosemide (LASIX) 20 MG tablet Take 1 tablet (20 mg total) by mouth daily as needed for edema. 01/25/19  Yes [name], MD  gabapentin (NEURONTIN) 600 MG tablet Take 1-1.5 tablets (600-900 mg total) by mouth 2 (two) times daily. Take 1 tablet in the AM and 1.5 tablet at bedtime 07/01/18  Yes Eappen, Saramma, MD  isosorbide mononitrate (IMDUR) 30 MG 24 hr tablet TAKE 1.5 TABLETS (45 MG TOTAL) BY MOUTH DAILY. 12/23/18  Yes  Burnell Blanks, MD  JANUVIA 100 MG tablet TAKE 1 TABLET BY MOUTH EVERY DAY 12/23/18  Yes [name], MD  levothyroxine (SYNTHROID) 25 MCG tablet TAKE 1 TABLET BY MOUTH BEFORE BREAKFAST 2-3 HOURS BEFORE TAKING ANY OTHER MEDICATION OR SUPPLEMENT 12/20/18  Yes [name], MD  lidocaine (LIDODERM) 5 % Place 1 patch onto the skin daily. Remove & Discard patch within 12 hours. 10/06/18  Yes [name], MD  Multiple Vitamins-Minerals (SUPER THERA VITE M) TABS Take by mouth.   Yes [provider]  NIFEdipine (PROCARDIA) 10 MG capsule TAKE 1 CAPSULE BY MOUTH 2 TIMES DAILY. TAKE 30 MINS BEFORE MEALS UP TO 1X PER DAY 11/23/18  Yes [name], MD  OLANZapine (ZYPREXA) 10 MG tablet Take 1 tablet (10 mg  total) by mouth at bedtime. 12/16/18  Yes Ursula Alert, MD  ondansetron (ZOFRAN) 4 MG tablet Take 1 tablet (4 mg total) by mouth 2 (two) times daily as needed for nausea or vomiting. 11/29/18  Yes [name], MD  pantoprazole (PROTONIX) 40 MG tablet TAKE 1 TABLET BY MOUTH EVERY DAY 01/31/19  Yes [name], MD  potassium chloride SA (K-DUR) 20 MEQ tablet Take 1 tablet (20 mEq total) by mouth daily as needed (take with lasix). 01/25/19  Yes [name], MD  pravastatin (PRAVACHOL) 10 MG tablet TAKE 1 TABLET BY MOUTH EVERYDAY AT BEDTIME 09/23/18  Yes [name], MD  nitroGLYCERIN (NITROSTAT) 0.4 MG SL tablet Place 1 tablet (0.4 mg total) under the tongue every 5 (five) minutes as needed for chest pain. 03/08/18 06/06/18  Burnell Blanks, MD  oxyCODONE-acetaminophen (PERCOCET) 5-325 MG tablet Take 1 tablet by mouth 2 (two) times daily as needed for severe pain. Patient not taking: Reported on 03/07/2019 01/21/19   McLean-Scocuzza, Nino Glow, MD    Allergies as of 03/02/2019 - Review Complete 02/25/2019  Allergen Reaction Noted  . Abilify [aripiprazole] Other (See Comments) 09/20/2013  . Augmentin [amoxicillin-pot clavulanate] Diarrhea and  Nausea And Vomiting 09/20/2013  . Bactrim [sulfamethoxazole-trimethoprim] Diarrhea 09/20/2013  . Ceftin [cefuroxime axetil] Diarrhea 09/20/2013  . Coconut oil  10/19/2015  . Diclofenac Other (See Comments) 08/01/2014  . Dye fdc red [red dye]  01/22/2016  . Indocin [indomethacin] Other (See Comments) 09/20/2013  . Linaclotide Diarrhea 12/09/2014  . Lisinopril Diarrhea and Other (See Comments) 09/20/2013  . Morphine Other (See Comments) 12/09/2014  . Morphine and related Other (See Comments) 09/20/2013  . Simvastatin Other (See Comments) 08/01/2014  . Sulfa antibiotics Diarrhea 12/09/2014  . Sulfamethoxazole-trimethoprim Diarrhea 12/09/2014  . Wellbutrin [bupropion] Other (See Comments) 09/20/2013  . Zetia [ezetimibe]  09/18/2017  . Oxycodone-acetaminophen Itching 07/10/2014  . Quetiapine fumarate Palpitations 08/01/2014  . Quetiapine fumarate Palpitations 08/16/2014    Family History  Problem Relation Age of Onset  . Stroke Mother        Deceased  . Lung cancer Father        Deceased  . Healthy Daughter   . Healthy Son        x 2  . Other Son        Suicide  . Cancer Brother   . Heart attack Neg Hx     Social History   Socioeconomic History  . Marital status: Divorced    Spouse name: Not on file  . Number of children: Not on file  . Years of education: Not on file  . Highest education level: Not on file  Occupational History  . Not on file  Social Needs  . Financial resource strain: Not hard at all  . Food insecurity    Worry: Never true    Inability: Never true  . Transportation needs    Medical: No    Non-medical: No  Tobacco Use  . Smoking status: Former Smoker    Packs/day: 1.50    Years: 10.00    Pack years: 15.00    Types: Cigarettes    Quit date: 04/12/1979    Years since quitting: 39.9  . Smokeless tobacco: Never Used  Substance and Sexual Activity  . Alcohol use: No    Alcohol/week: 0.0 standard drinks  . Drug use: No  . Sexual activity: Not  Currently  Lifestyle  . Physical activity    Days per week: 0 days  Minutes per session: Not on file  . Stress: Not on file  Relationships  . Social Herbalist on phone: Not on file    Gets together: Not on file    Attends religious service: Not on file    Active member of club or organization: Not on file    Attends meetings of clubs or organizations: Not on file    Relationship status: Not on file  . Intimate partner violence    Fear of current or ex partner: No    Emotionally abused: No    Physically abused: No    Forced sexual activity: No  Other Topics Concern  . Not on file  Social History Narrative   Lives with fiancee in an apartment on the first floor.  Has 3 grown children.  One child passed away.  On disability 12-06-94 - 2000, 2007 - current.     Education: some college.    Review of Systems: See HPI, otherwise negative ROS  Physical Exam: BP (!) 161/95   Pulse (!) 102   Temp (!) 96.8 F (36 C) (Tympanic)   Resp 20   Ht 5\' 6"  (1.676 m)   Wt 104.3 kg   SpO2 97%   BMI 37.12 kg/m  General:   Alert,  pleasant and cooperative in NAD Head:  Normocephalic and atraumatic. Neck:  Supple; no masses or thyromegaly. Lungs:  Clear throughout to auscultation, normal respiratory effort.    Heart:  +S1, +S2, Regular rate and rhythm, No edema. Abdomen:  Soft, nontender and nondistended. Normal bowel sounds, without guarding, and without rebound.   Neurologic:  Alert and  oriented x4;  grossly normal neurologically.  Impression/Plan: Valerie Vazquez is here for an endoscopy  to be performed for  evaluation of abdominal pain    Risks, benefits, limitations, and alternatives regarding endoscopy have been reviewed with the patient.  Questions have been answered.  All parties agreeable.   [name], MD  03/07/2019, 9:34 AM

## 2019-03-08 ENCOUNTER — Other Ambulatory Visit: Payer: Self-pay

## 2019-03-08 ENCOUNTER — Ambulatory Visit
Admission: RE | Admit: 2019-03-08 | Discharge: 2019-03-08 | Disposition: A | Discharge status: Home / Self Care | Payer: Medicare HMO | Source: Ambulatory Visit | Attending: Internal Medicine | Admitting: Internal Medicine

## 2019-03-08 ENCOUNTER — Encounter: Payer: Self-pay | Admitting: Gastroenterology

## 2019-03-08 DIAGNOSIS — I071 Rheumatic tricuspid insufficiency: Secondary | ICD-10-CM | POA: Diagnosis not present

## 2019-03-08 DIAGNOSIS — E119 Type 2 diabetes mellitus without complications: Secondary | ICD-10-CM | POA: Insufficient documentation

## 2019-03-08 DIAGNOSIS — I252 Old myocardial infarction: Secondary | ICD-10-CM | POA: Diagnosis not present

## 2019-03-08 DIAGNOSIS — F209 Schizophrenia, unspecified: Secondary | ICD-10-CM | POA: Diagnosis not present

## 2019-03-08 DIAGNOSIS — E785 Hyperlipidemia, unspecified: Secondary | ICD-10-CM | POA: Diagnosis not present

## 2019-03-08 DIAGNOSIS — I119 Hypertensive heart disease without heart failure: Secondary | ICD-10-CM | POA: Diagnosis not present

## 2019-03-08 DIAGNOSIS — R0609 Other forms of dyspnea: Secondary | ICD-10-CM | POA: Diagnosis not present

## 2019-03-08 DIAGNOSIS — R69 Illness, unspecified: Secondary | ICD-10-CM | POA: Diagnosis not present

## 2019-03-08 NOTE — Anesthesia Postprocedure Evaluation (Signed)
Anesthesia Post Note  Patient: Valerie Vazquez  Procedure(s) Performed: ESOPHAGOGASTRODUODENOSCOPY (EGD) WITH PROPOFOL (N/A )  Patient location during evaluation: PACU Anesthesia Type: General Level of consciousness: awake and alert Pain management: pain level controlled Vital Signs Assessment: post-procedure vital signs reviewed and stable Respiratory status: spontaneous breathing, nonlabored ventilation, respiratory function stable and patient connected to nasal cannula oxygen Cardiovascular status: blood pressure returned to baseline and stable Postop Assessment: no apparent nausea or vomiting Anesthetic complications: no     Last Vitals:  Vitals:   03/07/19 1018 03/07/19 1028  BP: 128/67 125/73  Pulse: 85 81  Resp: (!) 24 20  Temp:    SpO2: 93% 94%    Last Pain:  Vitals:   03/07/19 1028  TempSrc:   PainSc: 0-No pain                 Molli Barrows

## 2019-03-08 NOTE — Progress Notes (Signed)
*  PRELIMINARY RESULTS* Echocardiogram 2D Echocardiogram has been performed.  Valerie Vazquez 03/08/2019, 10:45 AM

## 2019-03-09 DIAGNOSIS — M2351 Chronic instability of knee, right knee: Secondary | ICD-10-CM | POA: Diagnosis not present

## 2019-03-09 DIAGNOSIS — M1712 Unilateral primary osteoarthritis, left knee: Secondary | ICD-10-CM | POA: Diagnosis not present

## 2019-03-09 DIAGNOSIS — M1711 Unilateral primary osteoarthritis, right knee: Secondary | ICD-10-CM | POA: Diagnosis not present

## 2019-03-09 DIAGNOSIS — M2352 Chronic instability of knee, left knee: Secondary | ICD-10-CM | POA: Diagnosis not present

## 2019-03-09 LAB — SURGICAL PATHOLOGY

## 2019-03-10 ENCOUNTER — Other Ambulatory Visit: Payer: Self-pay | Admitting: Family Medicine

## 2019-03-10 MED ORDER — EMPAGLIFLOZIN 10 MG PO TABS: 10.0000 mg | ORAL_TABLET | Freq: Every day | ORAL | 1 refills | Status: DC

## 2019-03-13 ENCOUNTER — Other Ambulatory Visit: Payer: Self-pay | Admitting: Family Medicine

## 2019-03-13 DIAGNOSIS — E063 Autoimmune thyroiditis: Secondary | ICD-10-CM

## 2019-03-14 ENCOUNTER — Encounter: Payer: Self-pay | Admitting: Family Medicine

## 2019-03-21 ENCOUNTER — Other Ambulatory Visit: Payer: Self-pay | Admitting: Family Medicine

## 2019-03-21 DIAGNOSIS — M62838 Other muscle spasm: Secondary | ICD-10-CM

## 2019-03-21 DIAGNOSIS — E785 Hyperlipidemia, unspecified: Secondary | ICD-10-CM

## 2019-03-22 ENCOUNTER — Other Ambulatory Visit: Payer: Self-pay | Admitting: Psychiatry

## 2019-03-22 DIAGNOSIS — F250 Schizoaffective disorder, bipolar type: Secondary | ICD-10-CM

## 2019-03-23 ENCOUNTER — Other Ambulatory Visit: Payer: Self-pay | Admitting: Psychiatry

## 2019-03-23 ENCOUNTER — Other Ambulatory Visit: Payer: Self-pay | Admitting: Family Medicine

## 2019-03-23 ENCOUNTER — Encounter: Payer: Self-pay | Admitting: Family Medicine

## 2019-03-23 DIAGNOSIS — Z86711 Personal history of pulmonary embolism: Secondary | ICD-10-CM

## 2019-03-23 DIAGNOSIS — F411 Generalized anxiety disorder: Secondary | ICD-10-CM

## 2019-03-23 DIAGNOSIS — Z95828 Presence of other vascular implants and grafts: Secondary | ICD-10-CM

## 2019-03-26 ENCOUNTER — Other Ambulatory Visit: Payer: Self-pay | Admitting: Family Medicine

## 2019-03-26 DIAGNOSIS — K224 Dyskinesia of esophagus: Secondary | ICD-10-CM

## 2019-03-27 ENCOUNTER — Encounter: Payer: Self-pay | Admitting: Gastroenterology

## 2019-03-28 ENCOUNTER — Ambulatory Visit (INDEPENDENT_AMBULATORY_CARE_PROVIDER_SITE_OTHER): Payer: Medicare HMO | Admitting: Gastroenterology

## 2019-03-28 DIAGNOSIS — R109 Unspecified abdominal pain: Secondary | ICD-10-CM | POA: Diagnosis not present

## 2019-03-28 DIAGNOSIS — K5900 Constipation, unspecified: Secondary | ICD-10-CM

## 2019-03-28 NOTE — Progress Notes (Signed)
[name] , MD [address]  [place]  [place], [place] [address]  Main: [phone]  Fax: [phone]   Primary Care Physician: [name], MD  Virtual Visit via Video Note  I connected with patient on 03/28/19 at  1:30 PM EDT by video and verified that I am speaking with the correct person using two identifiers.   I discussed the limitations, risks, security and privacy concerns of performing an evaluation and management service by video  and the availability of in person appointments. I also discussed with the patient that there may be a patient responsible charge related to this service. The patient expressed understanding and agreed to proceed.  Location of Patient: Home Location of Provider: Home Persons involved: Patient and provider only   History of Present Illness: Follow-up for abdominal pain, constipation  HPI: Valerie Vazquez is a 63 y.o. female   Summary of history : Previously been seen for constipation , GERD, elavated GGT in 11/2017(felt secondary to excess tyelnol use) and dysphagia. Constipation resolved with Rx and dysphagia resolved with PPI  EGD 02/17/17-normal ( at the time of endoscopy she had no dysphagia) Colonoscopy 02/17/17- 6 mm serrated adenoma resected. CT abdomen in 09/2016 showed no abnormalities of the liver . 02/09/2019: CMP-normal LFT's , CBC-stable.   Interval history 02/14/2019 -03/28/2019  02/16/2019: H pylori stool antigen negative 02/18/2019: CT abdomen- small b/l effusions, thickeneing of the distal gastrioc antrum and 3rd portion of the duodenum.  03/07/2019: EGD: gastric bx - gastritis  She was initially scheduled for an office visit but at the last moment was changed to a virtual visit.  She states that she has not had any abdominal pain since her last visit.  She suffers from constipation but she states that I "I know how to deal with it".  She is not taking any Colace or MiraLAX on a regular basis and says that  occasional use of an enema seems to do well for her.  She is in the process of having her IVC filter taken out.  She is taking her Protonix.  Does not take any NSAID's. She is on a blood thinner. Can tolerate fatty meals. S/p cholecystectomy in 2002.   She takes protonix - after her dinner.      Current Outpatient Medications  Medication Sig Dispense Refill   acetaminophen (TYLENOL) 500 MG tablet Take 1 tablet (500 mg total) by mouth every 6 (six) hours as needed. 30 tablet 0   carvedilol (COREG) 3.125 MG tablet Take 1 tablet (3.125 mg total) by mouth 2 (two) times daily with a meal. 60 tablet 11   cetirizine (ZYRTEC) 10 MG tablet Take 1 tablet (10 mg total) by mouth daily. 30 tablet 11   cyclobenzaprine (FLEXERIL) 10 MG tablet TAKE 1 TABLET BY MOUTH THREE TIMES A DAY 30 tablet 1   diclofenac sodium (VOLTAREN) 1 % GEL Apply 2 g topically 4 (four) times daily. Right arm/shoulder arthritis 1 Tube 2   dicyclomine (BENTYL) 20 MG tablet TAKE 1 TABLET BY MOUTH THREE TIMES A DAY AS NEEDED FOR MUSCLE SPASMS 270 tablet 0   diphenhydrAMINE (BENADRYL) 25 MG tablet Take 1 tablet (25 mg total) by mouth every 6 (six) hours. 12 tablet 0   ELIQUIS 5 MG TABS tablet TAKE 1 TABLET (5 MG TOTAL) BY MOUTH 2 (TWO) TIMES DAILY. 60 tablet 5   empagliflozin (JARDIANCE) 10 MG TABS tablet Take 10 mg by mouth daily. 90 tablet 1  enoxaparin (LOVENOX) 100 MG/ML injection Inject 1 mL (100 mg total) into the skin every 12 (twelve) hours. See instructions provided on Mychart. 7 mL 0   escitalopram (LEXAPRO) 20 MG tablet Take 1 tablet (20 mg total) by mouth daily. 90 tablet 1   famotidine (PEPCID) 20 MG tablet Take 1 tablet (20 mg total) by mouth at bedtime. 30 tablet 2   furosemide (LASIX) 20 MG tablet Take 1 tablet (20 mg total) by mouth daily as needed for edema. 30 tablet 3   gabapentin (NEURONTIN) 600 MG tablet Take 1-1.5 tablets (600-900 mg total) by mouth 2 (two) times daily. Take 1 tablet in the AM and  1.5 tablet at bedtime 225 tablet 1   isosorbide mononitrate (IMDUR) 30 MG 24 hr tablet TAKE 1.5 TABLETS (45 MG TOTAL) BY MOUTH DAILY. 135 tablet 1   JANUVIA 100 MG tablet TAKE 1 TABLET BY MOUTH EVERY DAY 90 tablet 1   levothyroxine (SYNTHROID) 25 MCG tablet TAKE 1 TABLET BY MOUTH BEFORE BREAKFAST 2-3 HOURS BEFORE TAKING ANY OTHER MEDICATION OR SUPPLEMENT 90 tablet 3   lidocaine (LIDODERM) 5 % Place 1 patch onto the skin daily. Remove & Discard patch within 12 hours. 30 patch 0   Multiple Vitamins-Minerals (SUPER THERA VITE M) TABS Take by mouth.     NIFEdipine (PROCARDIA) 10 MG capsule TAKE 1 CAPSULE BY MOUTH 2 TIMES DAILY. TAKE 30 MINS BEFORE MEALS UP TO 1X PER DAY 180 capsule 1   nitroGLYCERIN (NITROSTAT) 0.4 MG SL tablet Place 1 tablet (0.4 mg total) under the tongue every 5 (five) minutes as needed for chest pain. 25 tablet 6   OLANZapine (ZYPREXA) 10 MG tablet Take 1 tablet (10 mg total) by mouth at bedtime. 90 tablet 0   ondansetron (ZOFRAN) 4 MG tablet TAKE 1 TABLET (4 MG TOTAL) BY MOUTH 2 (TWO) TIMES DAILY AS NEEDED FOR NAUSEA OR VOMITING. 30 tablet 0   oxyCODONE-acetaminophen (PERCOCET) 5-325 MG tablet Take 1 tablet by mouth 2 (two) times daily as needed for severe pain. (Patient not taking: Reported on 03/07/2019) 10 tablet 0   pantoprazole (PROTONIX) 40 MG tablet TAKE 1 TABLET BY MOUTH EVERY DAY 60 tablet 1   potassium chloride SA (K-DUR) 20 MEQ tablet Take 1 tablet (20 mEq total) by mouth daily as needed (take with lasix). 30 tablet 3   pravastatin (PRAVACHOL) 10 MG tablet TAKE 1 TABLET BY MOUTH EVERYDAY AT BEDTIME 90 tablet 1   No current facility-administered medications for this visit.     Allergies as of 03/28/2019 - Review Complete 03/07/2019  Allergen Reaction Noted   Abilify [aripiprazole] Other (See Comments) 09/20/2013   Augmentin [amoxicillin-pot clavulanate] Diarrhea and Nausea And Vomiting 09/20/2013   Bactrim [sulfamethoxazole-trimethoprim] Diarrhea  09/20/2013   Ceftin [cefuroxime axetil] Diarrhea 09/20/2013   Coconut oil  10/19/2015   Diclofenac Other (See Comments) 08/01/2014   Dye fdc red [red dye]  01/22/2016   Indocin [indomethacin] Other (See Comments) 09/20/2013   Linaclotide Diarrhea 12/09/2014   Lisinopril Diarrhea and Other (See Comments) 09/20/2013   Morphine Other (See Comments) 12/09/2014   Morphine and related Other (See Comments) 09/20/2013   Simvastatin Other (See Comments) 08/01/2014   Sulfa antibiotics Diarrhea 12/09/2014   Sulfamethoxazole-trimethoprim Diarrhea 12/09/2014   Wellbutrin [bupropion] Other (See Comments) 09/20/2013   Zetia [ezetimibe]  09/18/2017   Oxycodone-acetaminophen Itching 07/10/2014   Quetiapine fumarate Palpitations 08/01/2014   Quetiapine fumarate Palpitations 08/16/2014    Review of Systems:    All systems reviewed and negative  except where noted in HPI.  General Appearance:    Alert, cooperative, no distress, appears stated age  Head:    Normocephalic, without obvious abnormality, atraumatic  Eyes:    PERRL, conjunctiva/corneas clear,  Ears:    Grossly normal hearing    Neurologic:  Grossly normal    Observations/Objective:  Labs: CMP     Component Value Date/Time   NA 138 02/24/2019 0850   NA 136 05/28/2014 0034   K 4.0 02/24/2019 0850   K 3.6 05/28/2014 0542   CL 105 02/24/2019 0850   CL 106 05/28/2014 0034   CO2 19 02/24/2019 0850   CO2 28 05/28/2014 0034   GLUCOSE 205 (H) 02/24/2019 0850   GLUCOSE 112 (H) 05/28/2014 0034   BUN 10 02/24/2019 0850   BUN 10 05/28/2014 0034   CREATININE 0.98 02/24/2019 0850   CREATININE 1.03 (H) 03/19/2018 1627   CALCIUM 9.1 02/24/2019 0850   CALCIUM 9.2 05/28/2014 0034   PROT 6.8 02/09/2019 1126   PROT 7.1 03/30/2018 0000   PROT 8.0 05/28/2014 0034   ALBUMIN 3.8 02/09/2019 1126   ALBUMIN 4.0 03/30/2018 0000   ALBUMIN 3.6 05/28/2014 0034   AST 14 02/09/2019 1126   AST 44 (H) 05/28/2014 0034   ALT 14 02/09/2019  1126   ALT 33 05/28/2014 0034   ALKPHOS 102 02/09/2019 1126   ALKPHOS 169 (H) 05/28/2014 0034   BILITOT 0.3 02/09/2019 1126   BILITOT 0.3 03/30/2018 0000   BILITOT 0.3 05/28/2014 0034   GFRNONAA >60 07/17/2018 1611   GFRNONAA >60 05/28/2014 0034   GFRNONAA >60 04/07/2014 0700   GFRAA >60 07/17/2018 1611   GFRAA >60 05/28/2014 0034   GFRAA >60 04/07/2014 0700   Lab Results  Component Value Date   WBC 6.9 02/09/2019   HGB 11.6 (L) 02/09/2019   HCT 34.3 (L) 02/09/2019   MCV 91.5 02/09/2019   PLT 368.0 02/09/2019    Imaging Studies: No results found.  Assessment and Plan:   Valerie Vazquez is a 63 y.o. y/o female here to see me today as a follow-up for abdominal pain felt due to be a combination from gastritis and constipation.  Since her last visit her symptoms have resolved.  She takes Protonix daily.  And she uses an enema occasionally as needed for constipation although in the past have suggested to try MiraLAX on a regular basis.  I suggested she continue with the present plan and call or schedule an office visit as needed if things are not improved.     I discussed the assessment and treatment plan with the patient. The patient was provided an opportunity to ask questions and all were answered. The patient agreed with the plan and demonstrated an understanding of the instructions.   The patient was advised to call back or seek an in-person evaluation if the symptoms worsen or if the condition fails to improve as anticipated.    Dr [name] MD,MRCP [facility]) Gastroenterology/Hepatology Pager: [phone]   Speech recognition software was used to dictate this note.

## 2019-04-01 ENCOUNTER — Other Ambulatory Visit: Payer: Self-pay

## 2019-04-01 ENCOUNTER — Telehealth: Payer: Self-pay

## 2019-04-01 ENCOUNTER — Ambulatory Visit (INDEPENDENT_AMBULATORY_CARE_PROVIDER_SITE_OTHER): Payer: Medicare HMO | Admitting: Family Medicine

## 2019-04-01 ENCOUNTER — Encounter: Payer: Self-pay | Admitting: Family Medicine

## 2019-04-01 DIAGNOSIS — I10 Essential (primary) hypertension: Secondary | ICD-10-CM

## 2019-04-01 DIAGNOSIS — N83202 Unspecified ovarian cyst, left side: Secondary | ICD-10-CM

## 2019-04-01 DIAGNOSIS — N83201 Unspecified ovarian cyst, right side: Secondary | ICD-10-CM

## 2019-04-01 DIAGNOSIS — E119 Type 2 diabetes mellitus without complications: Secondary | ICD-10-CM | POA: Diagnosis not present

## 2019-04-01 DIAGNOSIS — Z86711 Personal history of pulmonary embolism: Secondary | ICD-10-CM

## 2019-04-01 DIAGNOSIS — K224 Dyskinesia of esophagus: Secondary | ICD-10-CM | POA: Diagnosis not present

## 2019-04-01 MED ORDER — BLOOD GLUCOSE MONITOR KIT: PACK | 0 refills | Status: DC

## 2019-04-01 NOTE — Assessment & Plan Note (Signed)
She will continue Eliquis.  She will see interventional radiology as planned.

## 2019-04-01 NOTE — Assessment & Plan Note (Signed)
Much improved in frequency.  She will continue her current regimen.

## 2019-04-01 NOTE — Assessment & Plan Note (Signed)
Tolerating Jardiance.  Continue Jardiance and Januvia.  She will start checking sugars at home.  Plan for follow-up in 3 months with A1c.

## 2019-04-01 NOTE — Assessment & Plan Note (Signed)
Adequately controlled.  Continue current regimen. 

## 2019-04-01 NOTE — Assessment & Plan Note (Signed)
She will continue to follow with gynecology.  She is status post total hysterectomy for noncancerous cause and thus does not need Pap smears.

## 2019-04-01 NOTE — Progress Notes (Signed)
Virtual Visit via video Note  This visit type was conducted due to national recommendations for restrictions regarding the COVID-19 pandemic (e.g. social distancing).  This format is felt to be most appropriate for this patient at this time.  All issues noted in this document were discussed and addressed.  No physical exam was performed (except for noted visual exam findings with Video Visits).   I connected with Valerie Vazquez today at  2:45 PM EDT by a video enabled telemedicine application and verified that I am speaking with the correct person using two identifiers. Location patient: neighbor's house Location provider: work  Persons participating in the virtual visit: patient, provider  I discussed the limitations, risks, security and privacy concerns of performing an evaluation and management service by telephone and the availability of in person appointments. I also discussed with the patient that there may be a patient responsible charge related to this service. The patient expressed understanding and agreed to proceed.   Reason for visit: follow-up  HPI: History of PE: She has an IVC filter in place.  She has an appointment with interventional radiology at [facility] to consider removal at her request.  She is on Eliquis.  No bleeding episodes.  No shortness of breath.  Esophageal spasms: She rarely has these symptoms.  She is on Procardia and Imdur.  When she has symptoms she takes dicyclomine and it resolves.  Manifests as centralized chest discomfort.  She notes the symptoms have not changed other than that they rarely occur.  Hypertension: Typically around 135/75.  No shortness of breath or edema.  Taking carvedilol, Imdur, and Procardia.  Diabetes: Not checking sugars.  Taking Jardiance and Januvia.  No polyuria or polydipsia.  No hypoglycemia.  Her ophthalmology appointment was delayed related to the COVID-19 pandemic.  Ovarian cyst: She did see GYN.  They plan to monitor these  given that she has high risk.  She is status post total hysterectomy and bilateral salpingectomy.   ROS: See pertinent positives and negatives per HPI.  Past Medical History:  Diagnosis Date   Acute right-sided low back pain with right-sided sciatica 10/23/2017   Anemia    Bilateral renal cysts    CAD (coronary artery disease) 09/28/2016   Chest pain 10/14/2015   Chronic back pain    "upper and lower" (09/19/2014)   Chronic shoulder pain (Location of Primary Source of Pain) (Left) 01/29/2016   Colitis 04/08/2016   Coronary vasospasm ([place]) 12/09/2014   Depression    "major" (09/19/2014)   DVT (deep venous thrombosis) (HCC)    Esophageal spasm    Essential hypertension 10/28/2013   Gallstones    Gastritis    Gastroenteritis    GERD (gastroesophageal reflux disease)    Headache    "couple /month lately" (09/19/2014)   Heart disease 12/09/2016   High blood pressure    High cholesterol    History of blood transfusion 1985   related to hysterectomy   History of nuclear stress test    Myoview 1/19:  EF 65, no ischemia or scar   History of pulmonary embolus (PE) 09/19/2014   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 Rule out Paget's disease of the bone with blood work.    Hyperlipidemia 10/28/2013   Hypertension    IBS (irritable bowel syndrome)    Lumbar central spinal stenosis (moderate at L4-5; mild at L2-3 and L3-4) 01/29/2016   Migraine    "a few times/yr" (09/19/2014)   Myocardial infarction [facility]) 10/2010  X 3   "while hospitalized"   Neck pain 01/29/2016   Osteoarthritis    "qwhere; mostly around my joints" (09/19/2014)   Pleural effusion, bilateral 07/31/2015   Pneumonia 04/2014   PTSD (post-traumatic stress disorder)    Pulmonary embolism ([place]) 04/2001; 09/19/2014   "after gallbladder OR; "   Schizoaffective disorder ([place])    Schizoaffective disorder, depressive type ([place]) 08/28/2016   Sleep apnea    "mild" (09/19/2014)    Snoring    a. sleep study 5/16: No OSA   SOB (shortness of breath) 07/31/2015   Spinal stenosis    Spondylosis    Suicide attempt ([place]) 08/27/2016   Type II diabetes mellitus ([place])    "dx'd in 2007; lost weight; no RX for ~ 4 yr now" (09/19/2014)   Wheezing 02/25/2017    Past Surgical History:  Procedure Laterality Date   [place] DECOMP/DISCECTOMY FUSION  10/2012   in [place] C5-C7    [place] Right    BREAST EXCISIONAL BIOPSY Right YRS AGO   NEG   CARDIAC CATHETERIZATION   2000's; 2009   CHOLECYSTECTOMY  2002   COLONOSCOPY WITH PROPOFOL N/A 12/12/2016   Procedure: COLONOSCOPY WITH PROPOFOL;  Surgeon: [name], MD;  Location: [facility] ENDOSCOPY;  Service: Endoscopy;  Laterality: N/A;   COLONOSCOPY WITH PROPOFOL N/A 02/17/2017   Procedure: COLONOSCOPY WITH PROPOFOL;  Surgeon: [name], MD;  Location: [facility] ENDOSCOPY;  Service: Gastroenterology;  Laterality: N/A;   ESOPHAGOGASTRODUODENOSCOPY (EGD) WITH PROPOFOL N/A 02/17/2017   Procedure: ESOPHAGOGASTRODUODENOSCOPY (EGD) WITH PROPOFOL;  Surgeon: [name], MD;  Location: [facility] ENDOSCOPY;  Service: Gastroenterology;  Laterality: N/A;   ESOPHAGOGASTRODUODENOSCOPY (EGD) WITH PROPOFOL N/A 03/07/2019   Procedure: ESOPHAGOGASTRODUODENOSCOPY (EGD) WITH PROPOFOL;  Surgeon: [name], MD;  Location: [facility] ENDOSCOPY;  Service: Gastroenterology;  Laterality: N/A;   EXCISION/RELEASE BURSA HIP Right 12/1984   HERNIA REPAIR  1985   LEFT HEART CATHETERIZATION WITH CORONARY ANGIOGRAM N/A 12/09/2014   Procedure: LEFT HEART CATHETERIZATION WITH CORONARY ANGIOGRAM;  Surgeon: Burnell Blanks, MD;  Location: [facility] CATH LAB;  Service: Cardiovascular;  Laterality: N/A;   TONSILLECTOMY AND ADENOIDECTOMY  ~ 1968   TOTAL HIP ARTHROPLASTY Left [id number]   UMBILICAL HERNIA REPAIR  1985   VENA CAVA FILTER PLACEMENT  03/2014    Family History   Problem Relation Age of Onset   Stroke Mother        Deceased   Lung cancer Father        Deceased   Healthy Daughter    Healthy Son        x 2   Other Son        Suicide   Cancer Brother    Heart attack Neg Hx     SOCIAL HX: Former smoker.   Current Outpatient Medications:    acetaminophen (TYLENOL) 500 MG tablet, Take 1 tablet (500 mg total) by mouth every 6 (six) hours as needed., Disp: 30 tablet, Rfl: 0   carvedilol (COREG) 3.125 MG tablet, Take 1 tablet (3.125 mg total) by mouth 2 (two) times daily with a meal., Disp: 60 tablet, Rfl: 11   cetirizine (ZYRTEC) 10 MG tablet, Take 1 tablet (10 mg total) by mouth daily., Disp: 30 tablet, Rfl: 11   cyclobenzaprine (FLEXERIL) 10 MG tablet, TAKE 1 TABLET BY MOUTH THREE TIMES A DAY, Disp: 30 tablet, Rfl: 1   diclofenac sodium (VOLTAREN) 1 % GEL,  Apply 2 g topically 4 (four) times daily. Right arm/shoulder arthritis, Disp: 1 Tube, Rfl: 2   dicyclomine (BENTYL) 20 MG tablet, TAKE 1 TABLET BY MOUTH THREE TIMES A DAY AS NEEDED FOR MUSCLE SPASMS, Disp: 270 tablet, Rfl: 0   diphenhydrAMINE (BENADRYL) 25 MG tablet, Take 1 tablet (25 mg total) by mouth every 6 (six) hours., Disp: 12 tablet, Rfl: 0   ELIQUIS 5 MG TABS tablet, TAKE 1 TABLET (5 MG TOTAL) BY MOUTH 2 (TWO) TIMES DAILY., Disp: 60 tablet, Rfl: 5   empagliflozin (JARDIANCE) 10 MG TABS tablet, Take 10 mg by mouth daily., Disp: 90 tablet, Rfl: 1   enoxaparin (LOVENOX) 100 MG/ML injection, Inject 1 mL (100 mg total) into the skin every 12 (twelve) hours. See instructions provided on Mychart., Disp: 7 mL, Rfl: 0   escitalopram (LEXAPRO) 20 MG tablet, Take 1 tablet (20 mg total) by mouth daily., Disp: 90 tablet, Rfl: 1   famotidine (PEPCID) 20 MG tablet, Take 1 tablet (20 mg total) by mouth at bedtime., Disp: 30 tablet, Rfl: 2   furosemide (LASIX) 20 MG tablet, Take 1 tablet (20 mg total) by mouth daily as needed for edema., Disp: 30 tablet, Rfl: 3   gabapentin  (NEURONTIN) 600 MG tablet, Take 1-1.5 tablets (600-900 mg total) by mouth 2 (two) times daily. Take 1 tablet in the AM and 1.5 tablet at bedtime, Disp: 225 tablet, Rfl: 1   isosorbide mononitrate (IMDUR) 30 MG 24 hr tablet, TAKE 1.5 TABLETS (45 MG TOTAL) BY MOUTH DAILY., Disp: 135 tablet, Rfl: 1   JANUVIA 100 MG tablet, TAKE 1 TABLET BY MOUTH EVERY DAY, Disp: 90 tablet, Rfl: 1   levothyroxine (SYNTHROID) 25 MCG tablet, TAKE 1 TABLET BY MOUTH BEFORE BREAKFAST 2-3 HOURS BEFORE TAKING ANY OTHER MEDICATION OR SUPPLEMENT, Disp: 90 tablet, Rfl: 3   lidocaine (LIDODERM) 5 %, Place 1 patch onto the skin daily. Remove & Discard patch within 12 hours., Disp: 30 patch, Rfl: 0   Multiple Vitamins-Minerals (SUPER THERA VITE M) TABS, Take by mouth., Disp: , Rfl:    NIFEdipine (PROCARDIA) 10 MG capsule, TAKE 1 CAPSULE BY MOUTH 2 TIMES DAILY. TAKE 30 MINS BEFORE MEALS UP TO 1X PER DAY, Disp: 180 capsule, Rfl: 1   OLANZapine (ZYPREXA) 10 MG tablet, Take 1 tablet (10 mg total) by mouth at bedtime., Disp: 90 tablet, Rfl: 0   ondansetron (ZOFRAN) 4 MG tablet, TAKE 1 TABLET (4 MG TOTAL) BY MOUTH 2 (TWO) TIMES DAILY AS NEEDED FOR NAUSEA OR VOMITING., Disp: 30 tablet, Rfl: 0   oxyCODONE-acetaminophen (PERCOCET) 5-325 MG tablet, Take 1 tablet by mouth 2 (two) times daily as needed for severe pain., Disp: 10 tablet, Rfl: 0   pantoprazole (PROTONIX) 40 MG tablet, TAKE 1 TABLET BY MOUTH EVERY DAY, Disp: 60 tablet, Rfl: 1   potassium chloride SA (K-DUR) 20 MEQ tablet, Take 1 tablet (20 mEq total) by mouth daily as needed (take with lasix)., Disp: 30 tablet, Rfl: 3   pravastatin (PRAVACHOL) 10 MG tablet, TAKE 1 TABLET BY MOUTH EVERYDAY AT BEDTIME, Disp: 90 tablet, Rfl: 1   nitroGLYCERIN (NITROSTAT) 0.4 MG SL tablet, Place 1 tablet (0.4 mg total) under the tongue every 5 (five) minutes as needed for chest pain., Disp: 25 tablet, Rfl: 6  EXAM:  VITALS per patient if applicable: None.  GENERAL: alert, oriented,  appears well and in no acute distress  HEENT: atraumatic, conjunttiva clear, no obvious abnormalities on inspection of external nose and ears  NECK: normal movements  of the head and neck  LUNGS: on inspection no signs of respiratory distress, breathing rate appears normal, no obvious gross SOB, gasping or wheezing  CV: no obvious cyanosis  MS: moves all visible extremities without noticeable abnormality  PSYCH/NEURO: pleasant and cooperative, no obvious depression or anxiety, speech and thought processing grossly intact  ASSESSMENT AND PLAN:  Discussed the following assessment and plan:  Essential hypertension Adequately controlled.  Continue current regimen.  Esophageal spasm Much improved in frequency.  She will continue her current regimen.  Diabetes mellitus without complication ([place]) Tolerating Jardiance.  Continue Jardiance and Januvia.  She will start checking sugars at home.  Plan for follow-up in 3 months with A1c.  Ovarian cyst She will continue to follow with gynecology.  She is status post total hysterectomy for noncancerous cause and thus does not need Pap smears.  History of pulmonary embolus (PE) She will continue Eliquis.  She will see interventional radiology as planned.    Social distancing precautions and sick precautions given regarding COVID-19.  I discussed the assessment and treatment plan with the patient. The patient was provided an opportunity to ask questions and all were answered. The patient agreed with the plan and demonstrated an understanding of the instructions.   The patient was advised to call back or seek an in-person evaluation if the symptoms worsen or if the condition fails to improve as anticipated.    [name], MD

## 2019-04-01 NOTE — Telephone Encounter (Signed)
Faxed Rx for blood glucose meter kit to [facility] [address] [address]  Valerie Vazquez

## 2019-04-07 ENCOUNTER — Telehealth: Payer: Self-pay | Admitting: Family Medicine

## 2019-04-07 ENCOUNTER — Other Ambulatory Visit: Payer: Self-pay | Admitting: Psychiatry

## 2019-04-07 DIAGNOSIS — M62838 Other muscle spasm: Secondary | ICD-10-CM

## 2019-04-07 DIAGNOSIS — F250 Schizoaffective disorder, bipolar type: Secondary | ICD-10-CM

## 2019-04-07 MED ORDER — CYCLOBENZAPRINE HCL 10 MG PO TABS: 10.0000 mg | ORAL_TABLET | Freq: Three times a day (TID) | ORAL | 1 refills | Status: DC | PRN

## 2019-04-07 NOTE — Telephone Encounter (Signed)
Sent to pharmacy 

## 2019-04-07 NOTE — Telephone Encounter (Signed)
Last Refill:  03/21/19  Last OV:  04/01/19

## 2019-04-07 NOTE — Telephone Encounter (Signed)
Medication Refill - Medication:  cyclobenzaprine (FLEXERIL) 10 MG tablet   Has the patient contacted their pharmacy? Yes advised to call. Patient is wanting to have a supply for a full month and not just 10 days.   Preferred Pharmacy (with phone number or street name):  [id number] [name], [place] [phone] (Phone) [phone] (Fax)   Agent: Please be advised that RX refills may take up to 3 business days. We ask that you follow-up with your pharmacy.

## 2019-04-09 ENCOUNTER — Other Ambulatory Visit: Payer: Self-pay | Admitting: Family Medicine

## 2019-04-09 DIAGNOSIS — F250 Schizoaffective disorder, bipolar type: Secondary | ICD-10-CM

## 2019-04-10 NOTE — Progress Notes (Signed)
[facility]   Virtual Visit via Video Note I connected with patient on 04/11/19 at 11:45 AM EDT by video and verified that I am speaking with the correct person using two identifiers.   I discussed the limitations, risks of performing an evaluation and management service by video and the availability of in person appointments. I also discussed with the patient that there may be a patient responsible charge related to this service.  In light of current covid-19 pandemic, patient also understands that we are trying to protect them by minimizing in office contact if at all possible.  The patient expressed understanding and agreed to proceed. Please see note below for further detail. The visit was changed to telephone due to technical issues.    The patient was advised to call back or seek an in-person evaluation if the symptoms worsen or if the condition fails to improve as anticipated. I spent 15 minutes of face-to-face time during this visit.   Laverle Hobby, MD   Assessment and Plan:  Small bilateral pleural effusions, suspect diastolic dysfunction. -Echo showed normal cardiac function with no pulmonary hypertension.  - Currently she does not appear to be symptomatic from this finding.  Should she develop a significant symptoms we can consider trial of Lasix, she currently has Lasix which she uses as needed for leg swelling which she has not had to use in some months.  Recurrent PE. - No recent symptoms, continues to be on lifelong Eliquis. No bleeding complications.   Return if symptoms worsen..     Date: 04/10/2019  MRN# [id number] ALLISA EINSPAHR Feb 09, 1956  Referring Physician:  Rande Brunt FNP.  TEEA DUCEY is a 63 y.o. old female seen in consultation for chief complaint of: pleural effusion, chronic cough and dyspnea on exertion.  She was maintained on lifelong Eliquis.  She was seen at that time due to a CT chest which showed small bilateral pleural  effusions, at that time she denied any significant dyspnea on exertion.  Echocardiogram at that time showed    I last saw her in October 2018 at that time she was noted to have a history of recurrent PE, history pneumonia, small bilateral pleural effusions echocardiogram previously in 2017 showed EF of 65%, otherwise unremarkable.  At that time it was thought that the small bilateral pleural effusions were secondary to diastolic dysfunction.  She was asked to call back if she develops dyspnea at which time the plan was to use her Lasix regularly. She remains on eliquis and has no problems with bleeding. She takes it twice daily.   Her breathing is doing well, she has no new complaints. She had a recent CT AP for abd pain there was small bilat pleural effusion seen at that time. She has lasix at home, she takes it less than once per month, she uses it only for trouble breathing.    She has a cat all over house.  She has reflux, she takes protonix.  She has runny nose.  **Echocardiogram 03/08/19>>EF 60%, RVP reported as normal.  **CBC 02/09/2019>> AEC 300 **CT AP 02/18/2019>> CT chest 07/17/2018>> images personally reviewed, tiny bilateral effusions, enlarged liver.  Previous CT shows small bilateral pleural effusions with some slight compressive atelectasis.  Dilated IVC suggestive of volume overload. Echo 09/29/15 2017; LV EF: 60% - 65% Ct scan 02/23/17; images personally reviewed, there were small bilateral pleural effusions, enlarged pulmonary veins, both indicative of mild pulmonary venous hypertension, with volume overload. PSG 01/11/15>>AHI  was 0.5, which increased to as high as 15.7, when the patient was supine and REM sleep. However, overall the test was negative for obstructive sleep apnea  Medication:    Current Outpatient Medications:    acetaminophen (TYLENOL) 500 MG tablet, Take 1 tablet (500 mg total) by mouth every 6 (six) hours as needed., Disp: 30 tablet, Rfl: 0   blood glucose  meter kit and supplies KIT, Dispense based on patient and insurance preference. Use once daily. Dx code E11.9., Disp: 1 each, Rfl: 0   carvedilol (COREG) 3.125 MG tablet, Take 1 tablet (3.125 mg total) by mouth 2 (two) times daily with a meal., Disp: 60 tablet, Rfl: 11   cetirizine (ZYRTEC) 10 MG tablet, Take 1 tablet (10 mg total) by mouth daily., Disp: 30 tablet, Rfl: 11   cyclobenzaprine (FLEXERIL) 10 MG tablet, Take 1 tablet (10 mg total) by mouth 3 (three) times daily as needed for muscle spasms., Disp: 30 tablet, Rfl: 1   diclofenac sodium (VOLTAREN) 1 % GEL, Apply 2 g topically 4 (four) times daily. Right arm/shoulder arthritis, Disp: 1 Tube, Rfl: 2   dicyclomine (BENTYL) 20 MG tablet, TAKE 1 TABLET BY MOUTH THREE TIMES A DAY AS NEEDED FOR MUSCLE SPASMS, Disp: 270 tablet, Rfl: 0   diphenhydrAMINE (BENADRYL) 25 MG tablet, Take 1 tablet (25 mg total) by mouth every 6 (six) hours., Disp: 12 tablet, Rfl: 0   ELIQUIS 5 MG TABS tablet, TAKE 1 TABLET (5 MG TOTAL) BY MOUTH 2 (TWO) TIMES DAILY., Disp: 60 tablet, Rfl: 5   empagliflozin (JARDIANCE) 10 MG TABS tablet, Take 10 mg by mouth daily., Disp: 90 tablet, Rfl: 1   enoxaparin (LOVENOX) 100 MG/ML injection, Inject 1 mL (100 mg total) into the skin every 12 (twelve) hours. See instructions provided on Mychart., Disp: 7 mL, Rfl: 0   escitalopram (LEXAPRO) 20 MG tablet, Take 1 tablet (20 mg total) by mouth daily., Disp: 90 tablet, Rfl: 1   famotidine (PEPCID) 20 MG tablet, Take 1 tablet (20 mg total) by mouth at bedtime., Disp: 30 tablet, Rfl: 2   furosemide (LASIX) 20 MG tablet, Take 1 tablet (20 mg total) by mouth daily as needed for edema., Disp: 30 tablet, Rfl: 3   gabapentin (NEURONTIN) 600 MG tablet, Take 1-1.5 tablets (600-900 mg total) by mouth 2 (two) times daily. Take 1 tablet in the AM and 1.5 tablet at bedtime, Disp: 225 tablet, Rfl: 1   isosorbide mononitrate (IMDUR) 30 MG 24 hr tablet, TAKE 1.5 TABLETS (45 MG TOTAL) BY MOUTH  DAILY., Disp: 135 tablet, Rfl: 1   JANUVIA 100 MG tablet, TAKE 1 TABLET BY MOUTH EVERY DAY, Disp: 90 tablet, Rfl: 1   levothyroxine (SYNTHROID) 25 MCG tablet, TAKE 1 TABLET BY MOUTH BEFORE BREAKFAST 2-3 HOURS BEFORE TAKING ANY OTHER MEDICATION OR SUPPLEMENT, Disp: 90 tablet, Rfl: 3   lidocaine (LIDODERM) 5 %, Place 1 patch onto the skin daily. Remove & Discard patch within 12 hours., Disp: 30 patch, Rfl: 0   Multiple Vitamins-Minerals (SUPER THERA VITE M) TABS, Take by mouth., Disp: , Rfl:    NIFEdipine (PROCARDIA) 10 MG capsule, TAKE 1 CAPSULE BY MOUTH 2 TIMES DAILY. TAKE 30 MINS BEFORE MEALS UP TO 1X PER DAY, Disp: 180 capsule, Rfl: 1   nitroGLYCERIN (NITROSTAT) 0.4 MG SL tablet, Place 1 tablet (0.4 mg total) under the tongue every 5 (five) minutes as needed for chest pain., Disp: 25 tablet, Rfl: 6   OLANZapine (ZYPREXA) 10 MG tablet, Take 1 tablet (  10 mg total) by mouth at bedtime., Disp: 90 tablet, Rfl: 0   ondansetron (ZOFRAN) 4 MG tablet, TAKE 1 TABLET (4 MG TOTAL) BY MOUTH 2 (TWO) TIMES DAILY AS NEEDED FOR NAUSEA OR VOMITING., Disp: 30 tablet, Rfl: 0   oxyCODONE-acetaminophen (PERCOCET) 5-325 MG tablet, Take 1 tablet by mouth 2 (two) times daily as needed for severe pain., Disp: 10 tablet, Rfl: 0   pantoprazole (PROTONIX) 40 MG tablet, TAKE 1 TABLET BY MOUTH EVERY DAY, Disp: 60 tablet, Rfl: 1   potassium chloride SA (K-DUR) 20 MEQ tablet, Take 1 tablet (20 mEq total) by mouth daily as needed (take with lasix)., Disp: 30 tablet, Rfl: 3   pravastatin (PRAVACHOL) 10 MG tablet, TAKE 1 TABLET BY MOUTH EVERYDAY AT BEDTIME, Disp: 90 tablet, Rfl: 1   Allergies:  Abilify [aripiprazole], Augmentin [amoxicillin-pot clavulanate], Bactrim [sulfamethoxazole-trimethoprim], Ceftin [cefuroxime axetil], Coconut oil, Diclofenac, Dye fdc red [red dye], Indocin [indomethacin], Linaclotide, Lisinopril, Morphine, Morphine and related, Simvastatin, Sulfa antibiotics, Sulfamethoxazole-trimethoprim,  Wellbutrin [bupropion], Zetia [ezetimibe], Oxycodone-acetaminophen, Quetiapine fumarate, and Quetiapine fumarate  Review of Systems:  Constitutional: Feels well. Cardiovascular: Denies chest pain, exertional chest pain.  Pulmonary: Denies hemoptysis, pleuritic chest pain.   The remainder of systems were reviewed and were found to be negative other than what is documented in the HPI.    Physical Examination:   --  LABORATORY PANEL:   CBC No results for input(s): WBC, HGB, HCT, PLT in the last 168 hours. ------------------------------------------------------------------------------------------------------------------  Chemistries  No results for input(s): NA, K, CL, CO2, GLUCOSE, BUN, CREATININE, CALCIUM, MG, AST, ALT, ALKPHOS, BILITOT in the last 168 hours.  Invalid input(s): GFRCGP ------------------------------------------------------------------------------------------------------------------  Cardiac Enzymes No results for input(s): TROPONINI in the last 168 hours. ------------------------------------------------------------  RADIOLOGY:  No results found.     Thank  you for the consultation and for allowing [facility] to assist in the care of your patient. Our recommendations are noted above.  Please contact us if we can be of further service.   Marda Stalker, M.D., F.C.C.P.  Board Certified in Internal Medicine, Pulmonary Medicine, [employer], and Sleep Medicine.  [facility] Office Number: [phone]   04/10/2019

## 2019-04-11 ENCOUNTER — Ambulatory Visit (INDEPENDENT_AMBULATORY_CARE_PROVIDER_SITE_OTHER): Payer: Medicare HMO | Admitting: Internal Medicine

## 2019-04-11 DIAGNOSIS — Z86711 Personal history of pulmonary embolism: Secondary | ICD-10-CM | POA: Diagnosis not present

## 2019-04-11 DIAGNOSIS — I5032 Chronic diastolic (congestive) heart failure: Secondary | ICD-10-CM | POA: Diagnosis not present

## 2019-04-11 DIAGNOSIS — J90 Pleural effusion, not elsewhere classified: Secondary | ICD-10-CM | POA: Diagnosis not present

## 2019-04-11 DIAGNOSIS — R0609 Other forms of dyspnea: Secondary | ICD-10-CM | POA: Diagnosis not present

## 2019-04-11 NOTE — Patient Instructions (Signed)
Continue to use lasix (water pill) if needed for shortness of breath due to fluid in your lungs.

## 2019-04-12 ENCOUNTER — Telehealth: Payer: Self-pay

## 2019-04-12 ENCOUNTER — Telehealth: Payer: Self-pay | Admitting: Family Medicine

## 2019-04-12 DIAGNOSIS — F250 Schizoaffective disorder, bipolar type: Secondary | ICD-10-CM

## 2019-04-12 DIAGNOSIS — F251 Schizoaffective disorder, depressive type: Secondary | ICD-10-CM

## 2019-04-12 NOTE — Telephone Encounter (Signed)
This Rx was prescribed by a historical provider.  Pt said that she no longer sees this doctor and requesting that Dr. Caryl Bis refill this medication.

## 2019-04-12 NOTE — Telephone Encounter (Signed)
Copied from [place] [phone]. Topic: Quick Communication - Rx Refill/Question >> Apr 12, 2019  2:39 PM Leward Quan A wrote: Medication: OLANZapine (ZYPREXA) 10 MG tablet  Has the patient contacted their pharmacy? Yes.   (Agent: If no, request that the patient contact the pharmacy for the refill.) (Agent: If yes, when and what did the pharmacy advise?)  Preferred Pharmacy (with phone number or street name): [id number] [name], [place] [phone] (Phone) [phone] (Fax)    Agent: Please be advised that RX refills may take up to 3 business days. We ask that you follow-up with your pharmacy.

## 2019-04-12 NOTE — Telephone Encounter (Signed)
Copied from [place] [phone]. Topic: General - Other >> Apr 12, 2019  2:28 PM Leward Quan A wrote: Reason for CRM: Patient called to say that she have a Handicap placard completed and signed by Dr Caryl Bis. She also want Dr Caryl Bis to know that she was let go from her Psychiatrist because she have not been seen since last year and will need him to refill her medication until she find another one. Ph# [phone]

## 2019-04-12 NOTE — Telephone Encounter (Signed)
Zyprexa is not a medication that I prescribe. It needs to be managed by a specialist. I can refill for a short period of time if she is going to run out, but she will need to see a psychiatrist for this and I can refer to a new psychiatrist in needed.

## 2019-04-13 ENCOUNTER — Telehealth: Payer: Self-pay | Admitting: Family Medicine

## 2019-04-13 NOTE — Telephone Encounter (Signed)
Patient aware.  Patient says that she has 5 pills left enough for 5 days.  Patient is ok with being referred to a new psychiatrist.  Ok with being referred to either a female or female psychiatrist.

## 2019-04-13 NOTE — Telephone Encounter (Signed)
Patient was let go from her psyciatrist  because she had not been back in a year. She stated could you please rewrite her medication until she finds another psychiatrist.  Valerie Vazquez

## 2019-04-13 NOTE — Telephone Encounter (Signed)
Pt came in and dropped off Driving Placard. Pt would like a call when its ready to pick up. Its in color folder up front. Thank you!  Call pt @ [phone].

## 2019-04-14 MED ORDER — OLANZAPINE 10 MG PO TABS: 10.0000 mg | ORAL_TABLET | Freq: Every day | ORAL | 0 refills | Status: DC

## 2019-04-14 NOTE — Telephone Encounter (Signed)
Patient aware. Voiced understanding.

## 2019-04-14 NOTE — Telephone Encounter (Signed)
Called and informed patient that her refill is at the pharmacy and if she does not hear from the referral that was placed let us know she understood.  Nina,cma

## 2019-04-14 NOTE — Telephone Encounter (Signed)
I have refilled this and sent it to her pharmacy. I placed a referral. If she does not hear anything about the referral in the next 1-2 weeks she should contact us. Thanks.

## 2019-04-14 NOTE — Telephone Encounter (Signed)
I have done a short term refill for this medication. I have placed a psychiatry referral as well. She may need to go to [facility] for psychiatry and she does not need a referral for that as they provide walk in services. If she does not hear about a psychiatry appointment in the next 1-2 weeks she needs to contact us.

## 2019-04-15 ENCOUNTER — Other Ambulatory Visit: Payer: Self-pay | Admitting: Family Medicine

## 2019-04-18 DIAGNOSIS — Z86711 Personal history of pulmonary embolism: Secondary | ICD-10-CM | POA: Diagnosis not present

## 2019-04-18 DIAGNOSIS — Z95828 Presence of other vascular implants and grafts: Secondary | ICD-10-CM | POA: Diagnosis not present

## 2019-04-19 ENCOUNTER — Telehealth: Payer: Self-pay | Admitting: Family Medicine

## 2019-04-19 NOTE — Telephone Encounter (Signed)
Patient dropped off a handicap placard form. Please find out the specific reason she needs this for. Is she able to walk more than 200 feet without stopping? Does she have to walk with a cane or walker?

## 2019-04-21 ENCOUNTER — Encounter: Payer: Self-pay | Admitting: Family Medicine

## 2019-04-21 MED ORDER — BACLOFEN 10 MG PO TABS: 10.0000 mg | ORAL_TABLET | Freq: Three times a day (TID) | ORAL | 0 refills | Status: DC | PRN

## 2019-04-22 ENCOUNTER — Other Ambulatory Visit: Payer: Self-pay | Admitting: Family Medicine

## 2019-04-22 ENCOUNTER — Other Ambulatory Visit: Payer: Self-pay | Admitting: Psychiatry

## 2019-04-22 ENCOUNTER — Encounter: Payer: Self-pay | Admitting: Family Medicine

## 2019-04-22 DIAGNOSIS — F411 Generalized anxiety disorder: Secondary | ICD-10-CM

## 2019-04-25 MED ORDER — GABAPENTIN 600 MG PO TABS: 600.0000 mg | ORAL_TABLET | Freq: Two times a day (BID) | ORAL | 1 refills | Status: DC

## 2019-04-26 MED ORDER — GABAPENTIN 600 MG PO TABS: 600.0000 mg | ORAL_TABLET | Freq: Two times a day (BID) | ORAL | 1 refills | Status: DC

## 2019-04-26 NOTE — Addendum Note (Signed)
Addended by: [name] on: 04/26/2019 12:23 PM   Modules accepted: Orders

## 2019-04-29 ENCOUNTER — Ambulatory Visit
Admission: RE | Admit: 2019-04-29 | Discharge: 2019-04-29 | Disposition: A | Discharge status: Home / Self Care | Payer: Medicare HMO | Source: Ambulatory Visit | Attending: Family Medicine | Admitting: Family Medicine

## 2019-04-29 DIAGNOSIS — Z1231 Encounter for screening mammogram for malignant neoplasm of breast: Secondary | ICD-10-CM | POA: Diagnosis not present

## 2019-04-29 DIAGNOSIS — Z1239 Encounter for other screening for malignant neoplasm of breast: Secondary | ICD-10-CM

## 2019-04-29 NOTE — Telephone Encounter (Signed)
Called and left a message for the patient to call back to ask the specific reason she needed a handicap sticker. Is she able to walk more than 200 feet without stopping and does she walk with a cane or walker.   ok for pec to advise.  Nina,cma

## 2019-05-01 ENCOUNTER — Other Ambulatory Visit: Payer: Self-pay | Admitting: Family Medicine

## 2019-05-01 DIAGNOSIS — Z760 Encounter for issue of repeat prescription: Secondary | ICD-10-CM

## 2019-05-02 ENCOUNTER — Telehealth: Payer: Self-pay

## 2019-05-02 NOTE — Telephone Encounter (Signed)
Patient states that her lower back is not doing good and that is why she is needing the handicap placard. States she has spinal issues and it is causing pain and inability to walk long distances. Gae Bon, cma

## 2019-05-02 NOTE — Telephone Encounter (Signed)
Copied from [phone]. Topic: General - Inquiry >> May 02, 2019  1:53 PM Richardo Priest, [place] wrote: Reason for CRM: Patient called to notify PCP that her lower back is not doing good and that is why she is needing the handicap placard. States she has spinal issues and it is causing pain and inability to walk long distances. Please advise.

## 2019-05-03 DIAGNOSIS — Z0279 Encounter for issue of other medical certificate: Secondary | ICD-10-CM

## 2019-05-03 NOTE — Telephone Encounter (Signed)
Completed. Please fill in our information. Thanks.

## 2019-05-03 NOTE — Telephone Encounter (Signed)
Done and taken to front. Called patient and let her know it was ready for pick up. Kamiyah Kindel,cma

## 2019-05-04 ENCOUNTER — Telehealth: Payer: Self-pay | Admitting: *Deleted

## 2019-05-04 ENCOUNTER — Encounter: Payer: Self-pay | Admitting: Family Medicine

## 2019-05-04 DIAGNOSIS — G8929 Other chronic pain: Secondary | ICD-10-CM

## 2019-05-04 NOTE — Telephone Encounter (Signed)
Copied from Luis M. Cintron [phone]. Topic: General - Other >> May 04, 2019 12:15 PM Celene Kras A wrote: Reason for CRM: Pt called and is requesting to have a referral put in for a neuro surgeon for her back pain. Pt also states she is having a surgery on 05/12/2019 to have an IVC filter taken out. Pt is also requesting a letter on letterhead stating that pts son has come to be caregiver for an indefinite period of time. Please advise.

## 2019-05-04 NOTE — Telephone Encounter (Signed)
Pt called and is requesting to have a referral put in for a neuro surgeon for her back pain. Pt also states she is having a surgery on 05/12/2019 to have an IVC filter taken out. Pt is also requesting a letter on letterhead stating that pts son has come to be caregiver for an indefinite period of time. Please advise.  Nina,cma

## 2019-05-05 NOTE — Telephone Encounter (Signed)
Referral placed. Letter completed.

## 2019-05-07 ENCOUNTER — Encounter: Payer: Self-pay | Admitting: Family Medicine

## 2019-05-10 ENCOUNTER — Other Ambulatory Visit: Payer: Self-pay | Admitting: Family Medicine

## 2019-05-10 DIAGNOSIS — F250 Schizoaffective disorder, bipolar type: Secondary | ICD-10-CM

## 2019-05-10 DIAGNOSIS — R110 Nausea: Secondary | ICD-10-CM

## 2019-05-10 NOTE — Telephone Encounter (Signed)
Called to let the patient know the letter was mailed to her or she could print it off mychart.  Avantae Bither,cma

## 2019-05-11 ENCOUNTER — Telehealth: Payer: Self-pay | Admitting: Family Medicine

## 2019-05-11 ENCOUNTER — Other Ambulatory Visit: Payer: Self-pay | Admitting: Family Medicine

## 2019-05-11 ENCOUNTER — Other Ambulatory Visit: Payer: Self-pay

## 2019-05-11 DIAGNOSIS — R1013 Epigastric pain: Secondary | ICD-10-CM

## 2019-05-11 DIAGNOSIS — Z760 Encounter for issue of repeat prescription: Secondary | ICD-10-CM

## 2019-05-11 MED ORDER — CARVEDILOL 3.125 MG PO TABS: 3.1250 mg | ORAL_TABLET | Freq: Two times a day (BID) | ORAL | 2 refills | Status: DC

## 2019-05-11 MED ORDER — NITROGLYCERIN 0.4 MG SL SUBL: 0.4000 mg | SUBLINGUAL_TABLET | SUBLINGUAL | 1 refills | Status: DC | PRN

## 2019-05-11 NOTE — Telephone Encounter (Signed)
Pt called to advise Dr. Sallee Lange of her not hearing anything from Phychiatric provider for medications/ please advise     FYI: Pt is also having her IVC filter removal surgery tomorrow.

## 2019-05-12 ENCOUNTER — Other Ambulatory Visit: Payer: Self-pay

## 2019-05-12 ENCOUNTER — Emergency Department
Admission: EM | Admit: 2019-05-12 | Discharge: 2019-05-12 | Disposition: A | Discharge status: Home / Self Care | Payer: Medicare HMO | Attending: Emergency Medicine | Admitting: Emergency Medicine

## 2019-05-12 DIAGNOSIS — Z87891 Personal history of nicotine dependence: Secondary | ICD-10-CM | POA: Insufficient documentation

## 2019-05-12 DIAGNOSIS — T819XXA Unspecified complication of procedure, initial encounter: Secondary | ICD-10-CM | POA: Diagnosis present

## 2019-05-12 DIAGNOSIS — R58 Hemorrhage, not elsewhere classified: Secondary | ICD-10-CM | POA: Diagnosis not present

## 2019-05-12 DIAGNOSIS — I2510 Atherosclerotic heart disease of native coronary artery without angina pectoris: Secondary | ICD-10-CM | POA: Diagnosis not present

## 2019-05-12 DIAGNOSIS — Y69 Unspecified misadventure during surgical and medical care: Secondary | ICD-10-CM | POA: Diagnosis not present

## 2019-05-12 DIAGNOSIS — Z7984 Long term (current) use of oral hypoglycemic drugs: Secondary | ICD-10-CM | POA: Insufficient documentation

## 2019-05-12 DIAGNOSIS — Z95828 Presence of other vascular implants and grafts: Secondary | ICD-10-CM | POA: Diagnosis not present

## 2019-05-12 DIAGNOSIS — Z4589 Encounter for adjustment and management of other implanted devices: Secondary | ICD-10-CM | POA: Diagnosis not present

## 2019-05-12 DIAGNOSIS — E119 Type 2 diabetes mellitus without complications: Secondary | ICD-10-CM | POA: Insufficient documentation

## 2019-05-12 DIAGNOSIS — Z7901 Long term (current) use of anticoagulants: Secondary | ICD-10-CM | POA: Insufficient documentation

## 2019-05-12 DIAGNOSIS — Z4889 Encounter for other specified surgical aftercare: Secondary | ICD-10-CM | POA: Diagnosis not present

## 2019-05-12 DIAGNOSIS — Z86711 Personal history of pulmonary embolism: Secondary | ICD-10-CM | POA: Diagnosis not present

## 2019-05-12 DIAGNOSIS — Z79899 Other long term (current) drug therapy: Secondary | ICD-10-CM | POA: Insufficient documentation

## 2019-05-12 DIAGNOSIS — Z5189 Encounter for other specified aftercare: Secondary | ICD-10-CM | POA: Insufficient documentation

## 2019-05-12 DIAGNOSIS — I10 Essential (primary) hypertension: Secondary | ICD-10-CM | POA: Insufficient documentation

## 2019-05-12 DIAGNOSIS — Z96642 Presence of left artificial hip joint: Secondary | ICD-10-CM | POA: Diagnosis not present

## 2019-05-12 MED ORDER — BACLOFEN 10 MG PO TABS: 10.0000 mg | ORAL_TABLET | Freq: Three times a day (TID) | ORAL | 1 refills | Status: DC | PRN

## 2019-05-12 MED ORDER — ONDANSETRON 4 MG PO TBDP
4.0000 mg | ORAL_TABLET | Freq: Once | ORAL | Status: AC
  Administered 2019-05-12: 4 mg via ORAL
  Filled 2019-05-12: qty 1

## 2019-05-12 NOTE — ED Triage Notes (Signed)
PT to ED via EMS from home. PT had a laparoscopic surgery on jugular today, pt was instructed to continue to take her eliquis. PT had no bleeding all day until she laid down on that side. Soaked 1 bandage. Bleeding controlled at this time. Color wnl

## 2019-05-12 NOTE — ED Provider Notes (Signed)
[facility] Emergency Department Provider Note  ____________________________________________  Time seen: Approximately 11:14 PM  I have reviewed the triage vital signs and the nursing notes.   HISTORY  Chief Complaint Post-op Problem    HPI Valerie Vazquez is a 63 y.o. female presents to the emergency department with bleeding at incision site for IVC filter removal.  Patient states that surgical site did not bleed throughout the day but she fell asleep and noticed blood.  She became concerned.  Patient also states that she has been mildly nauseated at site of blood.  No other alleviating measures have been attempted.  Patient is currently taking Eliquis.        Past Medical History:  Diagnosis Date  . Acute right-sided low back pain with right-sided sciatica 10/23/2017  . Anemia   . Bilateral renal cysts   . CAD (coronary artery disease) 09/28/2016  . Chest pain 10/14/2015  . Chronic back pain    "upper and lower" (09/19/2014)  . Chronic shoulder pain (Location of Primary Source of Pain) (Left) 01/29/2016  . Colitis 04/08/2016  . Coronary vasospasm ([place]) 12/09/2014  . Depression    "major" (09/19/2014)  . DVT (deep venous thrombosis) ([place])   . Esophageal spasm   . Essential hypertension 10/28/2013  . Gallstones   . Gastritis   . Gastroenteritis   . GERD (gastroesophageal reflux disease)   . Headache    "couple /month lately" (09/19/2014)  . Heart disease 12/09/2016  . High blood pressure   . High cholesterol   . History of blood transfusion 1985   related to hysterectomy  . History of nuclear stress test    Myoview 1/19:  EF 65, no ischemia or scar  . History of pulmonary embolus (PE) 09/19/2014  . 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 Rule out Paget's disease of the bone with blood work.   . Hyperlipidemia 10/28/2013  . Hypertension   . IBS (irritable bowel syndrome)   . Lumbar central spinal stenosis (moderate at L4-5; mild  at L2-3 and L3-4) 01/29/2016  . Migraine    "a few times/yr" (09/19/2014)  . Myocardial infarction ([place]) 10/2010 X 3   "while hospitalized"  . Neck pain 01/29/2016  . Osteoarthritis    "qwhere; mostly around my joints" (09/19/2014)  . Pleural effusion, bilateral 07/31/2015  . Pneumonia 04/2014  . PTSD (post-traumatic stress disorder)   . Pulmonary embolism ([place]) 04/2001; 09/19/2014   "after gallbladder OR; "  . Schizoaffective disorder ([place])   . Schizoaffective disorder, depressive type ([place]) 08/28/2016  . Sleep apnea    "mild" (09/19/2014)  . Snoring    a. sleep study 5/16: No OSA  . SOB (shortness of breath) 07/31/2015  . Spinal stenosis   . Spondylosis   . Suicide attempt ([place]) 08/27/2016  . Type II diabetes mellitus ([place])    "dx'd in 2007; lost weight; no RX for ~ 4 yr now" (09/19/2014)  . Wheezing 02/25/2017    Patient Active Problem List   Diagnosis Date Noted  . Allergic rhinitis 01/27/2019  . Right hip pain 10/06/2018  . Oxygen saturation greater than 90% 09/28/2018  . Ovarian cyst 09/02/2018  . Orthostatic hypotension 03/21/2018  . Boil 10/27/2017  . Urinary incontinence 10/27/2017  . Elevated alkaline phosphatase level 10/27/2017  . Acute right-sided low back pain with right-sided sciatica 10/23/2017  . Subclinical hypothyroidism 08/26/2017  . Soft tissue swelling 05/29/2017  . Diabetes mellitus without complication ([place]) 55/73/2202  . Acute  right eye pain 03/25/2017  . Elevated glucose 02/25/2017  . Bruise 02/25/2017  . Paresthesia 02/06/2017  . Falls 01/30/2017  . Colon polyp 01/30/2017  . Right upper quadrant pain 01/12/2017  . Heart disease 12/09/2016  . Plantar fasciitis of left foot 11/07/2016  . Costochondritis 10/06/2016  . CAD (coronary artery disease) 09/28/2016  . Diarrhea 09/16/2016  . Epigastric discomfort 09/16/2016  . Schizoaffective disorder, depressive type ([place]) 08/28/2016  . Suicide attempt ([place]) 08/27/2016  . Colitis 04/08/2016  . Absolute  anemia 01/29/2016  . Neck pain 01/29/2016  . Chronic shoulder pain (Location of Primary Source of Pain) (Left) 01/29/2016  . Osteoarthritis of hip (Bilateral) 01/29/2016  . Lumbar central spinal stenosis (moderate at L4-5; mild at L2-3 and L3-4) 01/29/2016  . Hx of cervical spine surgery 01/29/2016  . Numbness 11/19/2015  . Breast pain 10/19/2015  . Chest pain 10/14/2015  . SOB (shortness of breath) 07/31/2015  . Pleural effusion, bilateral 07/31/2015  . Esophageal spasm 06/05/2015  . History of DVT (deep vein thrombosis) 02/25/2015  . 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 Constipation 12/12/2014  . Coronary vasospasm ([place]) 12/09/2014  . History of pulmonary embolus (PE) 09/19/2014  . Anxiety and depression 04/13/2014  . IBS (irritable bowel syndrome) 04/11/2014  . GERD (gastroesophageal reflux disease) 04/11/2014  . Insomnia 04/11/2014  . Essential hypertension 10/28/2013  . Anemia 10/28/2013  . Hyperlipidemia 10/28/2013    Past Surgical History:  Procedure Laterality Date  . ABDOMINAL HYSTERECTOMY  1985  . ANTERIOR CERVICAL DECOMP/DISCECTOMY FUSION  10/2012   in [place] C5-C7   . APPENDECTOMY  1985  . BACK SURGERY    . BREAST CYST EXCISION Right   . BREAST EXCISIONAL BIOPSY Right YRS AGO   NEG  . CARDIAC CATHETERIZATION   2000's; 2009  . CHOLECYSTECTOMY  2002  . COLONOSCOPY WITH PROPOFOL N/A 12/12/2016   Procedure: COLONOSCOPY WITH PROPOFOL;  Surgeon: [name], MD;  Location:  [facility] ENDOSCOPY;  Service: Endoscopy;  Laterality: N/A;  . COLONOSCOPY WITH PROPOFOL N/A 02/17/2017   Procedure: COLONOSCOPY WITH PROPOFOL;  Surgeon: [name], MD;  Location: [facility] ENDOSCOPY;  Service: Gastroenterology;  Laterality: N/A;  . ESOPHAGOGASTRODUODENOSCOPY (EGD) WITH PROPOFOL N/A 02/17/2017   Procedure: ESOPHAGOGASTRODUODENOSCOPY (EGD) WITH PROPOFOL;  Surgeon: [name], MD;  Location: [facility] ENDOSCOPY;  Service: Gastroenterology;  Laterality: N/A;  . ESOPHAGOGASTRODUODENOSCOPY (EGD) WITH  PROPOFOL N/A 03/07/2019   Procedure: ESOPHAGOGASTRODUODENOSCOPY (EGD) WITH PROPOFOL;  Surgeon: [name], MD;  Location: [facility] ENDOSCOPY;  Service: Gastroenterology;  Laterality: N/A;  . EXCISION/RELEASE BURSA HIP Right 12/1984  . HERNIA REPAIR  1985  . LEFT HEART CATHETERIZATION WITH CORONARY ANGIOGRAM N/A 12/09/2014   Procedure: LEFT HEART CATHETERIZATION WITH CORONARY ANGIOGRAM;  Surgeon: Burnell Blanks, MD;  Location: [facility] CATH LAB;  Service: Cardiovascular;  Laterality: N/A;  . TONSILLECTOMY AND ADENOIDECTOMY  ~ 1968  . TOTAL HIP ARTHROPLASTY Left 04-06-2014  . UMBILICAL HERNIA REPAIR  1985  . VENA CAVA FILTER PLACEMENT  03/2014    Prior to Admission medications   Medication Sig Start Date End Date Taking? Authorizing Provider  acetaminophen (TYLENOL) 500 MG tablet Take 1 tablet (500 mg total) by mouth every 6 (six) hours as needed. 09/29/18   Laban Emperor, PA-C  baclofen (LIORESAL) 10 MG tablet Take 1 tablet (10 mg total) by mouth 3 (three) times daily as needed for muscle spasms. 05/12/19   [name], MD  blood glucose meter kit and supplies KIT Dispense based on patient and insurance preference. Use once daily. Dx code  E11.9. 04/01/19   [name], MD  carvedilol (COREG) 3.125 MG tablet Take 1 tablet (3.125 mg total) by mouth 2 (two) times daily with a meal. 05/11/19   Burnell Blanks, MD  cetirizine (ZYRTEC) 10 MG tablet Take 1 tablet (10 mg total) by mouth daily. 01/25/19   [name], MD  diclofenac sodium (VOLTAREN) 1 % GEL Apply 2 g topically 4 (four) times daily. Right arm/shoulder arthritis 04/08/18   McLean-Scocuzza, Nino Glow, MD  dicyclomine (BENTYL) 20 MG tablet TAKE 1 TABLET BY MOUTH THREE TIMES A DAY AS NEEDED FOR MUSCLE SPASMS 03/24/19   [name], MD  diphenhydrAMINE (BENADRYL) 25 MG tablet Take 1 tablet (25 mg total) by mouth every 6 (six) hours. 09/12/16   Jola Schmidt, MD  ELIQUIS 5 MG TABS tablet TAKE 1 TABLET BY MOUTH TWICE A DAY  04/18/19   [name], MD  empagliflozin (JARDIANCE) 10 MG TABS tablet Take 10 mg by mouth daily. 03/10/19   [name], MD  enoxaparin (LOVENOX) 100 MG/ML injection Inject 1 mL (100 mg total) into the skin every 12 (twelve) hours. See instructions provided on Mychart. 03/03/19   [name], MD  escitalopram (LEXAPRO) 20 MG tablet Take 1 tablet (20 mg total) by mouth daily. 03/07/19   [name], MD  famotidine (PEPCID) 20 MG tablet TAKE 1 TABLET BY MOUTH EVERYDAY AT BEDTIME 05/12/19   Guse, Jacquelynn Cree, FNP  furosemide (LASIX) 20 MG tablet TAKE 1 TABLET (20 MG TOTAL) BY MOUTH DAILY AS NEEDED FOR EDEMA. 04/22/19   [name], MD  gabapentin (NEURONTIN) 600 MG tablet Take 1-1.5 tablets (600-900 mg total) by mouth 2 (two) times daily. Take 1 tablet in the AM and 1.5 tablet at bedtime 04/26/19   [name], MD  isosorbide mononitrate (IMDUR) 30 MG 24 hr tablet TAKE 1.5 TABLETS (45 MG TOTAL) BY MOUTH DAILY. 12/23/18   Burnell Blanks, MD  JANUVIA 100 MG tablet TAKE 1 TABLET BY MOUTH EVERY DAY 12/23/18   [name], MD  KLOR-CON M20 20 MEQ tablet TAKE 1 TABLET (20 MEQ TOTAL) BY MOUTH DAILY AS NEEDED (TAKE WITH LASIX). 04/22/19   [name], MD  levothyroxine (SYNTHROID) 25 MCG tablet TAKE 1 TABLET BY MOUTH BEFORE BREAKFAST 2-3 HOURS BEFORE TAKING ANY OTHER MEDICATION OR SUPPLEMENT 03/16/19   [name], MD  lidocaine (LIDODERM) 5 % Place 1 patch onto the skin daily. Remove & Discard patch within 12 hours. 10/06/18   [name], MD  Multiple Vitamins-Minerals (SUPER THERA VITE M) TABS Take by mouth.    [provider]  NIFEdipine (PROCARDIA) 10 MG capsule TAKE 1 CAPSULE BY MOUTH 2 TIMES DAILY. TAKE 30 MINS BEFORE MEALS UP TO 1X PER DAY 03/28/19   [name], MD  nitroGLYCERIN (NITROSTAT) 0.4 MG SL tablet Place 1 tablet (0.4 mg total) under the tongue every 5 (five) minutes as needed for chest pain. 05/11/19 08/09/19  Burnell Blanks, MD  OLANZapine (ZYPREXA) 10 MG tablet TAKE 1 TABLET BY MOUTH EVERYDAY AT BEDTIME 05/11/19   [name], MD  ondansetron (ZOFRAN) 4 MG tablet TAKE 1 TABLET (4 MG TOTAL) BY MOUTH 2 (TWO) TIMES DAILY AS NEEDED FOR NAUSEA OR VOMITING. 05/12/19   [name], MD  oxyCODONE-acetaminophen (PERCOCET) 5-325 MG tablet Take 1 tablet by mouth 2 (two) times daily as needed for severe pain. 01/21/19   McLean-Scocuzza, Nino Glow, MD  pantoprazole (  PROTONIX) 40 MG tablet TAKE 1 TABLET BY MOUTH EVERY DAY 05/03/19   [name], MD  pravastatin (PRAVACHOL) 10 MG tablet TAKE 1 TABLET BY MOUTH EVERYDAY AT BEDTIME 03/21/19   [name], MD    Allergies Abilify [aripiprazole], Augmentin [amoxicillin-pot clavulanate], Bactrim [sulfamethoxazole-trimethoprim], Ceftin [cefuroxime axetil], Coconut oil, Diclofenac, Dye fdc red [red dye], Indocin [indomethacin], Linaclotide, Lisinopril, Morphine, Morphine and related, Simvastatin, Sulfa antibiotics, Sulfamethoxazole-trimethoprim, Wellbutrin [bupropion], Zetia [ezetimibe], Oxycodone-acetaminophen, Quetiapine fumarate, and Quetiapine fumarate  Family History  Problem Relation Age of Onset  . Stroke Mother        Deceased  . Lung cancer Father        Deceased  . Healthy Daughter   . Healthy Son        x 2  . Other Son        Suicide  . Cancer Brother   . Heart attack Neg Hx     Social History Social History   Tobacco Use  . Smoking status: Former Smoker    Packs/day: 1.50    Years: 10.00    Pack years: 15.00    Types: Cigarettes    Quit date: 04/12/1979    Years since quitting: 40.1  . Smokeless tobacco: Never Used  Substance Use Topics  . Alcohol use: No    Alcohol/week: 0.0 standard drinks  . Drug use: No     Review of Systems  Constitutional: No fever/chills Eyes: No visual changes. No discharge ENT: No upper respiratory complaints. Cardiovascular: no chest pain. Respiratory: no cough. No  SOB. Gastrointestinal: No abdominal pain.  No nausea, no vomiting.  No diarrhea.  No constipation. Genitourinary: Negative for dysuria. No hematuria Musculoskeletal: Negative for musculoskeletal pain. Skin: Patient is here for wound check.  Neurological: Negative for headaches, focal weakness or numbness.   ____________________________________________   PHYSICAL EXAM:  VITAL SIGNS: ED Triage Vitals [05/12/19 2223]  Enc Vitals Group     BP      Pulse      Resp      Temp      Temp src      SpO2      Weight 225 lb (102.1 kg)     Height '5\' 6"'$  (1.676 m)     Head Circumference      Peak Flow      Pain Score 4     Pain Loc      Pain Edu?      Excl. in Person?      Constitutional: Alert and oriented. Well appearing and in no acute distress. Eyes: Conjunctivae are normal. PERRL. EOMI. Head: Atraumatic. Cardiovascular: Normal rate, regular rhythm. Normal S1 and S2.  Good peripheral circulation. Respiratory: Normal respiratory effort without tachypnea or retractions. Lungs CTAB. Good air entry to the bases with no decreased or absent breath sounds. Gastrointestinal: Bowel sounds 4 quadrants. Soft and nontender to palpation. No guarding or rigidity. No palpable masses. No distention. No CVA tenderness. Musculoskeletal: Full range of motion to all extremities. No gross deformities appreciated. Neurologic:  Normal speech and language. No gross focal neurologic deficits are appreciated.  Skin: Patient has 1-1/2 cm incision visualized at right neck.  No active bleeding. Psychiatric: Mood and affect are normal. Speech and behavior are normal. Patient exhibits appropriate insight and judgement.   ____________________________________________   LABS (all labs ordered are listed, but only abnormal results are displayed)  Labs Reviewed - No data to display ____________________________________________  EKG   ____________________________________________  RADIOLOGY  No results  found.  ____________________________________________    PROCEDURES  Procedure(s) performed:    Procedures    Medications  ondansetron (ZOFRAN-ODT) disintegrating tablet 4 mg (4 mg Oral Given 05/12/19 2242)     ____________________________________________   INITIAL IMPRESSION / ASSESSMENT AND PLAN / ED COURSE  Pertinent labs & imaging results that were available during my care of the patient were reviewed by me and considered in my medical decision making (see chart for details).  Review of the [place] CSRS was performed in accordance of the [place] prior to dispensing any controlled drugs.         Assessment and plan Wound check 63 year old female presents to the emergency department with concern for a bleeding surgical site.  Bleeding resolved prior to being evaluated.  Patient's wound was redressed in the emergency department.  Zofran was given for nausea.  All patient questions were answered.    ____________________________________________  FINAL CLINICAL IMPRESSION(S) / ED DIAGNOSES  Final diagnoses:  Visit for wound check      NEW MEDICATIONS STARTED DURING THIS VISIT:  ED Discharge Orders    None          This chart was dictated using voice recognition software/Dragon. Despite best efforts to proofread, errors can occur which can change the meaning. Any change was purely unintentional.    Lannie Fields, PA-C 05/12/19 2318    Vanessa [place], MD 05/17/19 1154

## 2019-05-12 NOTE — Telephone Encounter (Signed)
Melissa can you check on this please? Thanks

## 2019-05-17 ENCOUNTER — Telehealth: Payer: Self-pay

## 2019-05-17 DIAGNOSIS — I10 Essential (primary) hypertension: Secondary | ICD-10-CM | POA: Diagnosis not present

## 2019-05-17 DIAGNOSIS — G8929 Other chronic pain: Secondary | ICD-10-CM | POA: Diagnosis not present

## 2019-05-17 DIAGNOSIS — M545 Low back pain: Secondary | ICD-10-CM | POA: Diagnosis not present

## 2019-05-17 DIAGNOSIS — Z86711 Personal history of pulmonary embolism: Secondary | ICD-10-CM | POA: Diagnosis not present

## 2019-05-17 DIAGNOSIS — I2510 Atherosclerotic heart disease of native coronary artery without angina pectoris: Secondary | ICD-10-CM | POA: Diagnosis not present

## 2019-05-17 DIAGNOSIS — Z86718 Personal history of other venous thrombosis and embolism: Secondary | ICD-10-CM | POA: Diagnosis not present

## 2019-05-17 DIAGNOSIS — E119 Type 2 diabetes mellitus without complications: Secondary | ICD-10-CM | POA: Diagnosis not present

## 2019-05-17 DIAGNOSIS — Z7901 Long term (current) use of anticoagulants: Secondary | ICD-10-CM | POA: Diagnosis not present

## 2019-05-17 DIAGNOSIS — K219 Gastro-esophageal reflux disease without esophagitis: Secondary | ICD-10-CM | POA: Diagnosis not present

## 2019-05-17 NOTE — Telephone Encounter (Signed)
Pt wants to move forward with the surgery, Please call and set up a date and time

## 2019-05-18 DIAGNOSIS — R5381 Other malaise: Secondary | ICD-10-CM | POA: Diagnosis not present

## 2019-05-18 DIAGNOSIS — E1165 Type 2 diabetes mellitus with hyperglycemia: Secondary | ICD-10-CM | POA: Diagnosis not present

## 2019-05-18 DIAGNOSIS — R42 Dizziness and giddiness: Secondary | ICD-10-CM | POA: Diagnosis not present

## 2019-05-19 ENCOUNTER — Other Ambulatory Visit: Payer: Self-pay

## 2019-05-19 ENCOUNTER — Emergency Department
Admission: EM | Admit: 2019-05-19 | Discharge: 2019-05-19 | Disposition: A | Discharge status: Home / Self Care | Payer: Medicare HMO | Attending: Emergency Medicine | Admitting: Emergency Medicine

## 2019-05-19 DIAGNOSIS — Z79899 Other long term (current) drug therapy: Secondary | ICD-10-CM | POA: Insufficient documentation

## 2019-05-19 DIAGNOSIS — Z87891 Personal history of nicotine dependence: Secondary | ICD-10-CM | POA: Diagnosis not present

## 2019-05-19 DIAGNOSIS — I10 Essential (primary) hypertension: Secondary | ICD-10-CM | POA: Insufficient documentation

## 2019-05-19 DIAGNOSIS — E119 Type 2 diabetes mellitus without complications: Secondary | ICD-10-CM | POA: Insufficient documentation

## 2019-05-19 DIAGNOSIS — R42 Dizziness and giddiness: Secondary | ICD-10-CM | POA: Insufficient documentation

## 2019-05-19 DIAGNOSIS — Z7901 Long term (current) use of anticoagulants: Secondary | ICD-10-CM | POA: Diagnosis not present

## 2019-05-19 DIAGNOSIS — I2510 Atherosclerotic heart disease of native coronary artery without angina pectoris: Secondary | ICD-10-CM | POA: Insufficient documentation

## 2019-05-19 LAB — CBC
HCT: 35.3 % — ABNORMAL LOW (ref 36.0–46.0)
Hemoglobin: 11.6 g/dL — ABNORMAL LOW (ref 12.0–15.0)
MCH: 30.4 pg (ref 26.0–34.0)
MCHC: 32.9 g/dL (ref 30.0–36.0)
MCV: 92.4 fL (ref 80.0–100.0)
Platelets: 325 10*3/uL (ref 150–400)
RBC: 3.82 MIL/uL — ABNORMAL LOW (ref 3.87–5.11)
RDW: 14.1 % (ref 11.5–15.5)
WBC: 8.6 10*3/uL (ref 4.0–10.5)
nRBC: 0 % (ref 0.0–0.2)

## 2019-05-19 LAB — URINALYSIS, COMPLETE (UACMP) WITH MICROSCOPIC
Bacteria, UA: NONE SEEN
Bilirubin Urine: NEGATIVE
Glucose, UA: >=500 mg/dL — AB
Hgb urine dipstick: NEGATIVE
Ketones, ur: NEGATIVE mg/dL
Nitrite: NEGATIVE
Protein, ur: NEGATIVE mg/dL
Specific Gravity, Urine: 1.016 (ref 1.005–1.030)
pH: 5 (ref 5.0–8.0)

## 2019-05-19 LAB — BASIC METABOLIC PANEL
Anion gap: 8 (ref 5–15)
BUN: 10 mg/dL (ref 8–23)
CO2: 26 mmol/L (ref 22–32)
Calcium: 9.4 mg/dL (ref 8.9–10.3)
Chloride: 106 mmol/L (ref 98–111)
Creatinine, Ser: 0.83 mg/dL (ref 0.44–1.00)
GFR calc Af Amer: >60 mL/min (ref >60)
GFR calc non Af Amer: >60 mL/min (ref >60)
Glucose, Bld: 208 mg/dL — ABNORMAL HIGH (ref 70–99)
Potassium: 3.7 mmol/L (ref 3.5–5.1)
Sodium: 140 mmol/L (ref 135–145)

## 2019-05-19 LAB — TROPONIN I (HIGH SENSITIVITY): Troponin I (High Sensitivity): 6 ng/L (ref <18)

## 2019-05-19 MED ORDER — SODIUM CHLORIDE 0.9 % IV BOLUS
1000.0000 mL | Freq: Once | INTRAVENOUS | Status: AC
  Administered 2019-05-19: 1000 mL via INTRAVENOUS

## 2019-05-19 MED ORDER — SODIUM CHLORIDE 0.9% FLUSH: 3.0000 mL | Freq: Once | INTRAVENOUS | Status: DC

## 2019-05-19 NOTE — ED Notes (Signed)
Pt assisted to bedside commode. ambulatory with slow but steady gait. Pt c/o slight dizziness. No distress noted. Denies sob or chest pain. Provided for comfort and safety and will continue to assess.

## 2019-05-19 NOTE — ED Triage Notes (Signed)
Pt to ED via EMS from home. Per pt she has been feeling dizzy since last night, took her bp and got 99 systolic and became concerned. Pt states she feels most dizzy while laying down. Pt has extensive PMH. Denies any pain currently. Per ems pt was orthostatic

## 2019-05-19 NOTE — ED Provider Notes (Signed)
[facility] Emergency Department Provider Note  Time seen: 12:22 AM  I have reviewed the triage vital signs and the nursing notes.   HISTORY  Chief Complaint Dizziness   HPI Valerie Vazquez is a 63 y.o. 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  According to the patient over the past few days she has been experiencing intermittent episodes of dizziness.  States she will feel lightheaded, states multiple times she is checked her blood pressure and it has been registering in the upper 90s during these episodes.  Patient states tonight she felt lightheaded and checked her blood pressure and it was 99/45.  Patient was concerned so she called EMS.  EMS states upon arrival patient's blood pressure was in the 120s but it did appear to drop upon standing.  Patient denies any dehydration.  States she has been eating and drinking normally.  Patient did have a surgery last week to remove an IVC filter.  Denies any chest pain or shortness of breath.  Denies any fever cough or congestion.   Past Medical History:  Diagnosis Date  . Acute right-sided low back pain with right-sided sciatica 10/23/2017  . Anemia   . Bilateral renal cysts   . CAD (coronary artery disease) 09/28/2016  . Chest pain 10/14/2015  . Chronic back pain    "upper and lower" (09/19/2014)  . Chronic shoulder pain (Location of Primary Source of Pain) (Left) 01/29/2016  . Colitis 04/08/2016  . Coronary vasospasm ([place]) 12/09/2014  . Depression    "major" (09/19/2014)  . DVT (deep venous thrombosis) ([place])   . Esophageal spasm   . Essential hypertension 10/28/2013  . Gallstones   . Gastritis   . Gastroenteritis   . GERD (gastroesophageal reflux disease)   . Headache    "couple /month lately" (09/19/2014)  . Heart disease 12/09/2016  . High blood pressure   . High cholesterol   . History of blood  transfusion 1985   related to hysterectomy  . History of nuclear stress test    Myoview 1/19:  EF 65, no ischemia or scar  . History of pulmonary embolus (PE) 09/19/2014  . 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 Rule out Paget's disease of the bone with blood work.   . Hyperlipidemia 10/28/2013  . Hypertension   . IBS (irritable bowel syndrome)   . Lumbar central spinal stenosis (moderate at L4-5; mild at L2-3 and L3-4) 01/29/2016  . Migraine    "a few times/yr" (09/19/2014)  . Myocardial infarction ([place]) 10/2010 X 3   "while hospitalized"  . Neck pain 01/29/2016  . Osteoarthritis    "qwhere; mostly around my joints" (09/19/2014)  . Pleural effusion, bilateral 07/31/2015  . Pneumonia 04/2014  . PTSD (post-traumatic stress disorder)   . Pulmonary embolism ([place]) 04/2001; 09/19/2014   "after gallbladder OR; "  . Schizoaffective disorder ([place])   . Schizoaffective disorder, depressive type ([place]) 08/28/2016  . Sleep apnea    "mild" (09/19/2014)  . Snoring    a. sleep study 5/16: No OSA  . SOB (shortness of breath) 07/31/2015  . Spinal stenosis   . Spondylosis   . Suicide attempt ([place]) 08/27/2016  . Type II diabetes mellitus ([place])    "dx'd in 2007; lost weight; no RX for ~ 4 yr now" (09/19/2014)  . Wheezing 02/25/2017    Patient Active Problem List   Diagnosis Date  Noted  . Allergic rhinitis 01/27/2019  . Right hip pain 10/06/2018  . Oxygen saturation greater than 90% 09/28/2018  . Ovarian cyst 09/02/2018  . Orthostatic hypotension 03/21/2018  . Boil 10/27/2017  . Urinary incontinence 10/27/2017  . Elevated alkaline phosphatase level 10/27/2017  . Acute right-sided low back pain with right-sided sciatica 10/23/2017  . Subclinical hypothyroidism 08/26/2017  . Soft tissue swelling 05/29/2017  . Diabetes mellitus without complication ([place]) 70/62/3762  . Acute right eye pain 03/25/2017  . Elevated glucose 02/25/2017  . Bruise 02/25/2017  . Paresthesia 02/06/2017   . Falls 01/30/2017  . Colon polyp 01/30/2017  . Right upper quadrant pain 01/12/2017  . Heart disease 12/09/2016  . Plantar fasciitis of left foot 11/07/2016  . Costochondritis 10/06/2016  . CAD (coronary artery disease) 09/28/2016  . Diarrhea 09/16/2016  . Epigastric discomfort 09/16/2016  . Schizoaffective disorder, depressive type ([place]) 08/28/2016  . Suicide attempt ([place]) 08/27/2016  . Colitis 04/08/2016  . Absolute anemia 01/29/2016  . Neck pain 01/29/2016  . Chronic shoulder pain (Location of Primary Source of Pain) (Left) 01/29/2016  . Osteoarthritis of hip (Bilateral) 01/29/2016  . Lumbar central spinal stenosis (moderate at L4-5; mild at L2-3 and L3-4) 01/29/2016  . Hx of cervical spine surgery 01/29/2016  . Numbness 11/19/2015  . Breast pain 10/19/2015  . Chest pain 10/14/2015  . SOB (shortness of breath) 07/31/2015  . Pleural effusion, bilateral 07/31/2015  . Esophageal spasm 06/05/2015  . History of DVT (deep vein thrombosis) 02/25/2015  . 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 Constipation 12/12/2014  . Coronary vasospasm ([place]) 12/09/2014  . History of pulmonary embolus (PE) 09/19/2014  . Anxiety and depression 04/13/2014  . IBS (irritable bowel syndrome) 04/11/2014  . GERD (gastroesophageal reflux disease) 04/11/2014  . Insomnia 04/11/2014  . Essential hypertension 10/28/2013  . Anemia 10/28/2013  . Hyperlipidemia 10/28/2013    Past Surgical History:  Procedure Laterality Date  . ABDOMINAL HYSTERECTOMY  1985  . ANTERIOR CERVICAL DECOMP/DISCECTOMY FUSION  10/2012   in [place] C5-C7   . APPENDECTOMY  1985  . BACK SURGERY    . BREAST CYST EXCISION Right   . BREAST EXCISIONAL BIOPSY Right YRS AGO   NEG  . CARDIAC CATHETERIZATION   2000's; 2009  . CHOLECYSTECTOMY  2002  . COLONOSCOPY WITH PROPOFOL N/A 12/12/2016   Procedure: COLONOSCOPY WITH PROPOFOL;  Surgeon: [name], MD;  Location: [facility] ENDOSCOPY;  Service: Endoscopy;  Laterality: N/A;  . COLONOSCOPY WITH  PROPOFOL N/A 02/17/2017   Procedure: COLONOSCOPY WITH PROPOFOL;  Surgeon: [name], MD;  Location: [facility] ENDOSCOPY;  Service: Gastroenterology;  Laterality: N/A;  . ESOPHAGOGASTRODUODENOSCOPY (EGD) WITH PROPOFOL N/A 02/17/2017   Procedure: ESOPHAGOGASTRODUODENOSCOPY (EGD) WITH PROPOFOL;  Surgeon: [name], MD;  Location: [facility] ENDOSCOPY;  Service: Gastroenterology;  Laterality: N/A;  . ESOPHAGOGASTRODUODENOSCOPY (EGD) WITH PROPOFOL N/A 03/07/2019   Procedure: ESOPHAGOGASTRODUODENOSCOPY (EGD) WITH PROPOFOL;  Surgeon: [name], MD;  Location: [facility] ENDOSCOPY;  Service: Gastroenterology;  Laterality: N/A;  . EXCISION/RELEASE BURSA HIP Right 12/1984  . HERNIA REPAIR  1985  . LEFT HEART CATHETERIZATION WITH CORONARY ANGIOGRAM N/A 12/09/2014   Procedure: LEFT HEART CATHETERIZATION WITH CORONARY ANGIOGRAM;  Surgeon: Burnell Blanks, MD;  Location: [facility] CATH LAB;  Service: Cardiovascular;  Laterality: N/A;  . TONSILLECTOMY AND ADENOIDECTOMY  ~ 1968  . TOTAL HIP ARTHROPLASTY Left 04-06-2014  . UMBILICAL HERNIA REPAIR  1985  . VENA CAVA FILTER PLACEMENT  03/2014    Prior to Admission medications   Medication Sig Start Date End Date  Taking? Authorizing Provider  acetaminophen (TYLENOL) 500 MG tablet Take 1 tablet (500 mg total) by mouth every 6 (six) hours as needed. 09/29/18   Laban Emperor, PA-C  baclofen (LIORESAL) 10 MG tablet Take 1 tablet (10 mg total) by mouth 3 (three) times daily as needed for muscle spasms. 05/12/19   [name], MD  blood glucose meter kit and supplies KIT Dispense based on patient and insurance preference. Use once daily. Dx code E11.9. 04/01/19   [name], MD  carvedilol (COREG) 3.125 MG tablet Take 1 tablet (3.125 mg total) by mouth 2 (two) times daily with a meal. 05/11/19   Burnell Blanks, MD  cetirizine (ZYRTEC) 10 MG tablet Take 1 tablet (10 mg total) by mouth daily. 01/25/19   [name], MD  diclofenac sodium (VOLTAREN) 1 % GEL Apply 2  g topically 4 (four) times daily. Right arm/shoulder arthritis 04/08/18   McLean-Scocuzza, Nino Glow, MD  dicyclomine (BENTYL) 20 MG tablet TAKE 1 TABLET BY MOUTH THREE TIMES A DAY AS NEEDED FOR MUSCLE SPASMS 03/24/19   [name], MD  diphenhydrAMINE (BENADRYL) 25 MG tablet Take 1 tablet (25 mg total) by mouth every 6 (six) hours. 09/12/16   Jola Schmidt, MD  ELIQUIS 5 MG TABS tablet TAKE 1 TABLET BY MOUTH TWICE A DAY 04/18/19   [name], MD  empagliflozin (JARDIANCE) 10 MG TABS tablet Take 10 mg by mouth daily. 03/10/19   [name], MD  enoxaparin (LOVENOX) 100 MG/ML injection Inject 1 mL (100 mg total) into the skin every 12 (twelve) hours. See instructions provided on Mychart. 03/03/19   [name], MD  escitalopram (LEXAPRO) 20 MG tablet Take 1 tablet (20 mg total) by mouth daily. 03/07/19   [name], MD  famotidine (PEPCID) 20 MG tablet TAKE 1 TABLET BY MOUTH EVERYDAY AT BEDTIME 05/12/19   Guse, Jacquelynn Cree, FNP  furosemide (LASIX) 20 MG tablet TAKE 1 TABLET (20 MG TOTAL) BY MOUTH DAILY AS NEEDED FOR EDEMA. 04/22/19   [name], MD  gabapentin (NEURONTIN) 600 MG tablet Take 1-1.5 tablets (600-900 mg total) by mouth 2 (two) times daily. Take 1 tablet in the AM and 1.5 tablet at bedtime 04/26/19   [name], MD  isosorbide mononitrate (IMDUR) 30 MG 24 hr tablet TAKE 1.5 TABLETS (45 MG TOTAL) BY MOUTH DAILY. 12/23/18   Burnell Blanks, MD  JANUVIA 100 MG tablet TAKE 1 TABLET BY MOUTH EVERY DAY 12/23/18   [name], MD  KLOR-CON M20 20 MEQ tablet TAKE 1 TABLET (20 MEQ TOTAL) BY MOUTH DAILY AS NEEDED (TAKE WITH LASIX). 04/22/19   [name], MD  levothyroxine (SYNTHROID) 25 MCG tablet TAKE 1 TABLET BY MOUTH BEFORE BREAKFAST 2-3 HOURS BEFORE TAKING ANY OTHER MEDICATION OR SUPPLEMENT 03/16/19   [name], MD  lidocaine (LIDODERM) 5 % Place 1 patch onto the skin daily. Remove & Discard patch within 12 hours. 10/06/18   [name], MD  Multiple Vitamins-Minerals (SUPER THERA VITE M) TABS Take by mouth.    [provider]  NIFEdipine (PROCARDIA) 10 MG capsule TAKE 1 CAPSULE BY MOUTH 2 TIMES DAILY. TAKE 30 MINS BEFORE MEALS UP TO 1X PER DAY 03/28/19   [name], MD  nitroGLYCERIN (NITROSTAT) 0.4 MG SL tablet Place 1 tablet (0.4 mg total) under the tongue every 5 (five) minutes as needed for chest pain. 05/11/19 08/09/19  Burnell Blanks, MD  OLANZapine (  ZYPREXA) 10 MG tablet TAKE 1 TABLET BY MOUTH EVERYDAY AT BEDTIME 05/11/19   [name], MD  ondansetron (ZOFRAN) 4 MG tablet TAKE 1 TABLET (4 MG TOTAL) BY MOUTH 2 (TWO) TIMES DAILY AS NEEDED FOR NAUSEA OR VOMITING. 05/12/19   [name], MD  oxyCODONE-acetaminophen (PERCOCET) 5-325 MG tablet Take 1 tablet by mouth 2 (two) times daily as needed for severe pain. 01/21/19   McLean-Scocuzza, Nino Glow, MD  pantoprazole (PROTONIX) 40 MG tablet TAKE 1 TABLET BY MOUTH EVERY DAY 05/03/19   [name], MD  pravastatin (PRAVACHOL) 10 MG tablet TAKE 1 TABLET BY MOUTH EVERYDAY AT BEDTIME 03/21/19   [name], MD    Allergies  Allergen Reactions  . Abilify [Aripiprazole] Other (See Comments)    Jerking movement, slow motor skills  . Augmentin [Amoxicillin-Pot Clavulanate] Diarrhea and Nausea And Vomiting  . Bactrim [Sulfamethoxazole-Trimethoprim] Diarrhea  . Ceftin [Cefuroxime Axetil] Diarrhea  . Coconut Oil     Rash   . Diclofenac Other (See Comments)     Muscle Pain  . Dye Fdc Red [Red Dye]     "hair dye"  . Indocin [Indomethacin] Other (See Comments)    Migraines   . Linaclotide Diarrhea  . Lisinopril Diarrhea and Other (See Comments)    Other reaction(s): Dizziness, Vomiting  . Morphine Other (See Comments)    Chest Tightness  . Morphine And Related Other (See Comments)    Chest tightness   . Simvastatin Other (See Comments)    Muscle Pain  . Sulfa Antibiotics Diarrhea  . Sulfamethoxazole-Trimethoprim Diarrhea  .  Wellbutrin [Bupropion] Other (See Comments)    Jerking movement Jerking movement Slurred speech  . Zetia [Ezetimibe]     Diarrhea   . Quetiapine Fumarate Palpitations  . Quetiapine Fumarate Palpitations    Family History  Problem Relation Age of Onset  . Stroke Mother        Deceased  . Lung cancer Father        Deceased  . Healthy Daughter   . Healthy Son        x 2  . Other Son        Suicide  . Cancer Brother   . Heart attack Neg Hx     Social History Social History   Tobacco Use  . Smoking status: Former Smoker    Packs/day: 1.50    Years: 10.00    Pack years: 15.00    Types: Cigarettes    Quit date: 04/12/1979    Years since quitting: 40.1  . Smokeless tobacco: Never Used  Substance Use Topics  . Alcohol use: No    Alcohol/week: 0.0 standard drinks  . Drug use: No    Review of Systems Constitutional: Negative for fever.  Intermittent lightheadedness. Cardiovascular: Negative for chest pain. Respiratory: Negative for shortness of breath. Gastrointestinal: Negative for abdominal pain Genitourinary: Negative for urinary compaints Musculoskeletal: Negative for musculoskeletal complaints Skin: Negative for skin complaints  Neurological: Negative for headache All other ROS negative  ____________________________________________   PHYSICAL EXAM:  VITAL SIGNS: ED Triage Vitals  Enc Vitals Group     BP 05/19/19 0007 126/67     Pulse Rate 05/19/19 0007 68     Resp 05/19/19 0007 17     Temp 05/19/19 0007 97.6 F (36.4 C)     Temp src --      SpO2 05/19/19 0007 95 %     Weight 05/19/19 0003 225 lb (102.1 kg)  Height 05/19/19 0003 '5\' 6"'$  (1.676 m)     Head Circumference --      Peak Flow --      Pain Score 05/19/19 0003 0     Pain Loc --      Pain Edu? --      Excl. in [place]? --    Constitutional: Alert and oriented. Well appearing and in no distress. Eyes: Normal exam ENT      Head: Normocephalic and atraumatic.      Mouth/Throat: Mucous  membranes are moist. Cardiovascular: Normal rate, regular rhythm.  Respiratory: Normal respiratory effort without tachypnea nor retractions. Breath sounds are clear Gastrointestinal: Soft and nontender. No distention.   Musculoskeletal: Nontender with normal range of motion in all extremities. Neurologic:  Normal speech and language. No gross focal neurologic deficits  Skin:  Skin is warm, dry and intact.  Psychiatric: Mood and affect are normal.  ____________________________________________    EKG  EKG viewed and interpreted by myself shows a normal sinus rhythm at 65 bpm with a narrow QRS, normal axis, normal intervals, nonspecific ST changes without ST elevation.  ____________________________________________   INITIAL IMPRESSION / ASSESSMENT AND PLAN / ED COURSE  Pertinent labs & imaging results that were available during my care of the patient were reviewed by me and considered in my medical decision making (see chart for details).   Patient presents emergency department for intermittent episodes of lightheadedness.  Differential would include hypotension, anemia, metabolic or electrolyte abnormality, dehydration, ACS.  We will check labs including cardiac enzymes, IV hydrate and continue to closely monitor.  Overall patient appears well with a reassuring physical exam and reassuring vitals.  Patient's labs are largely within normal limits.  No concerning abnormalities.  Negative troponin.  Patient has continued to appear well throughout her stay in the emergency department on telemetry with reassuring blood pressures.  We will discharge home with PCP follow-up.  Valerie Vazquez was evaluated in Emergency Department on 05/19/2019 for the symptoms described in the history of present illness. She was evaluated in the context of the global COVID-19 pandemic, which necessitated consideration that the patient might be at risk for infection with the SARS-CoV-2 virus that causes COVID-19.  Institutional protocols and algorithms that pertain to the evaluation of patients at risk for COVID-19 are in a state of rapid change based on information released by regulatory bodies including the CDC and federal and state organizations. These policies and algorithms were followed during the patient's care in the ED.  ____________________________________________   FINAL CLINICAL IMPRESSION(S) / ED DIAGNOSES  Dizziness   Harvest Dark, MD 05/19/19 [phone]

## 2019-05-20 ENCOUNTER — Other Ambulatory Visit: Payer: Self-pay

## 2019-05-20 ENCOUNTER — Ambulatory Visit (INDEPENDENT_AMBULATORY_CARE_PROVIDER_SITE_OTHER): Payer: Medicare HMO | Admitting: Family Medicine

## 2019-05-20 ENCOUNTER — Encounter: Payer: Self-pay | Admitting: Family Medicine

## 2019-05-20 VITALS — BP 110/70 | HR 83 | Temp 97.0°F | Ht 66.0 in | Wt 224.8 lb

## 2019-05-20 DIAGNOSIS — M25532 Pain in left wrist: Secondary | ICD-10-CM | POA: Diagnosis not present

## 2019-05-20 DIAGNOSIS — R0602 Shortness of breath: Secondary | ICD-10-CM | POA: Diagnosis not present

## 2019-05-20 DIAGNOSIS — R6889 Other general symptoms and signs: Secondary | ICD-10-CM | POA: Diagnosis not present

## 2019-05-20 DIAGNOSIS — Z86711 Personal history of pulmonary embolism: Secondary | ICD-10-CM | POA: Diagnosis not present

## 2019-05-20 DIAGNOSIS — I10 Essential (primary) hypertension: Secondary | ICD-10-CM

## 2019-05-20 DIAGNOSIS — Z20822 Contact with and (suspected) exposure to covid-19: Secondary | ICD-10-CM

## 2019-05-20 NOTE — Assessment & Plan Note (Addendum)
Brief episode this morning with some wheezing.  This could be driven by her postnasal drip and allergies though given the COVID-19 pandemic we will test her for that to evaluate further for COVID-19.  Discussed strict quarantine with her and she was given paperwork from the health department outlining this.  She will be tested today and she is given test site information.  Advised that she should remain on quarantine until released by [place].  Will consider further treatment of allergies if testing is negative and symptoms have improved.  Given reasons to seek medical attention.

## 2019-05-20 NOTE — Telephone Encounter (Signed)
Patient needs an appt to discuss. Not a good surgical candidate. She might need referral to gyn/onc

## 2019-05-20 NOTE — Assessment & Plan Note (Signed)
I suspect soft tissue injury given relatively benign exam.  If not improving she will let us know.

## 2019-05-20 NOTE — Progress Notes (Signed)
[name], MD Phone: [phone]  Valerie Vazquez is a 63 y.o. female who presents today for follow-up.  History of PE: Patient underwent IVC filter removal.  This went well though she did have some bleeding after the procedure and she was evaluated emergency department.  The bleeding was controlled and she was discharged home.  She has not had any recurrent bleeding.  She does note some soreness at the site of her neck from the procedure.  She continues on Eliquis.  Hypertension: Blood pressure was running low intermittently over the last week or so.  She was evaluated in the emergency department yesterday and had reassuring lab work and was given IV fluids.  Her blood pressure had been 99/49 at home.  She had been lightheaded and felt slightly sleepy intermittently.  She notes no chest pain.  She has felt well since having the IV fluids.  Shortness of breath/wheezing: Patient reports onset of this earlier this morning.  She has had this previously and she felt as though it was related to her allergies.  She has chronic postnasal drip and occasional cough from that.  She takes cetirizine with minimal benefit.  She does take over-the-counter Benadryl.  Flonase has not been helpful.  She notes no fevers.  No sick contacts.  She notes she was not tested prior to her IVC filter removal.  She has no active symptoms at this time.  Left wrist injury: Patient reports she tripped over her cat's bed and caught herself on the corner of the bed with her left hand.  She notes a little bit of left wrist soreness yesterday after this occurred though she has not had any soreness today.  She did take Tylenol for this.  She takes Tylenol most days for chronic joint pains.  She has good range of motion of her left wrist and arm.  Social History   Tobacco Use  Smoking Status Former Smoker  . Packs/day: 1.50  . Years: 10.00  . Pack years: 15.00  . Types: Cigarettes  . Quit date: 04/12/1979  . Years since  quitting: 40.1  Smokeless Tobacco Never Used     ROS see history of present illness  Objective  Physical Exam Vitals:   05/20/19 1143  BP: 110/70  Pulse: 83  Temp: (!) 97 F (36.1 C)  SpO2: 97%    BP Readings from Last 3 Encounters:  05/20/19 110/70  05/19/19 112/62  05/12/19 (!) 146/65   Wt Readings from Last 3 Encounters:  05/20/19 224 lb 12.8 oz (102 kg)  05/19/19 225 lb (102.1 kg)  05/12/19 225 lb (102.1 kg)    Physical Exam Constitutional:      General: She is not in acute distress.    Appearance: She is not diaphoretic.  HENT:     Head:   Cardiovascular:     Rate and Rhythm: Normal rate and regular rhythm.     Heart sounds: Normal heart sounds.  Pulmonary:     Effort: Pulmonary effort is normal.     Breath sounds: Normal breath sounds.  Musculoskeletal:     Comments: Left wrist with minimal swelling dorsally that the patient reports is chronic, there is minimal tenderness over the soft tissues though no bony tenderness or bony defects, she has full range of motion of the left wrist and forearm, 5/5 strength bilateral grip, hands are warm and well perfused  Skin:    General: Skin is warm and dry.  Neurological:     Mental  Status: She is alert.      Assessment/Plan: Please see individual problem list.  SOB (shortness of breath) Brief episode this morning with some wheezing.  This could be driven by her postnasal drip and allergies though given the COVID-19 pandemic we will test her for that to evaluate further for COVID-19.  Discussed strict quarantine with her and she was given paperwork from the health department outlining this.  She will be tested today and she is given test site information.  Advised that she should remain on quarantine until released by [place].  Will consider further treatment of allergies if testing is negative and symptoms have improved.  Given reasons to seek medical attention.  History of pulmonary embolus (PE) She will continue  with Eliquis.  She has undergone removal of her IVC filter.  Essential hypertension Adequately controlled currently.  Suspect orthostasis previously.  This improved with adequate fluid repletion.  She will try to remain well-hydrated.  We could consider medication alteration if this recurs.  Left wrist pain I suspect soft tissue injury given relatively benign exam.  If not improving she will let us know.   No orders of the defined types were placed in this encounter.   No orders of the defined types were placed in this encounter.    [name], MD [place]

## 2019-05-20 NOTE — Patient Instructions (Signed)
Nice to see you. We will get you tested for COVID19.  Please stay quarantined as we discussed.  If you develop worsening breathing issues please seek medical attention. Please continue to monitor your blood pressure. Please monitor your left wrist and if it does not improve please let us know.

## 2019-05-20 NOTE — Assessment & Plan Note (Signed)
Adequately controlled currently.  Suspect orthostasis previously.  This improved with adequate fluid repletion.  She will try to remain well-hydrated.  We could consider medication alteration if this recurs.

## 2019-05-20 NOTE — Assessment & Plan Note (Signed)
She will continue with Eliquis.  She has undergone removal of her IVC filter.

## 2019-05-21 LAB — NOVEL CORONAVIRUS, NAA: SARS-CoV-2, NAA: NOT DETECTED

## 2019-05-22 ENCOUNTER — Encounter: Payer: Self-pay | Admitting: Family Medicine

## 2019-05-23 ENCOUNTER — Telehealth: Payer: Self-pay | Admitting: Family Medicine

## 2019-05-23 ENCOUNTER — Ambulatory Visit: Payer: Self-pay | Admitting: *Deleted

## 2019-05-23 ENCOUNTER — Ambulatory Visit (INDEPENDENT_AMBULATORY_CARE_PROVIDER_SITE_OTHER): Payer: Medicare HMO | Admitting: Family Medicine

## 2019-05-23 ENCOUNTER — Encounter: Payer: Self-pay | Admitting: Family Medicine

## 2019-05-23 ENCOUNTER — Other Ambulatory Visit: Payer: Self-pay

## 2019-05-23 DIAGNOSIS — K0889 Other specified disorders of teeth and supporting structures: Secondary | ICD-10-CM | POA: Diagnosis not present

## 2019-05-23 DIAGNOSIS — K047 Periapical abscess without sinus: Secondary | ICD-10-CM

## 2019-05-23 MED ORDER — TRAMADOL HCL 50 MG PO TABS: 50.0000 mg | ORAL_TABLET | Freq: Three times a day (TID) | ORAL | 0 refills | Status: DC | PRN

## 2019-05-23 MED ORDER — PENICILLIN V POTASSIUM 500 MG PO TABS: 500.0000 mg | ORAL_TABLET | Freq: Four times a day (QID) | ORAL | 0 refills | Status: DC

## 2019-05-23 MED ORDER — TRAMADOL HCL 50 MG PO TABS: 50.0000 mg | ORAL_TABLET | Freq: Three times a day (TID) | ORAL | 0 refills | Status: AC | PRN

## 2019-05-23 NOTE — Telephone Encounter (Signed)
Awesome Valerie Vazquez and you give me this resource so when you are not here.

## 2019-05-23 NOTE — Telephone Encounter (Signed)
Wonderful, thank you!!  LG

## 2019-05-23 NOTE — Telephone Encounter (Signed)
Patient is schedule 05/30/19 at 2:50 with SDJ

## 2019-05-23 NOTE — Progress Notes (Signed)
Patient ID: Valerie Vazquez, female   DOB: Aug 17, 1956, 63 y.o.   MRN: [id number]    Virtual Visit via video Note  This visit type was conducted due to national recommendations for restrictions regarding the COVID-19 pandemic (e.g. social distancing).  This format is felt to be most appropriate for this patient at this time.  All issues noted in this document were discussed and addressed.  No physical exam was performed (except for noted visual exam findings with Video Visits).   I connected with Valerie Vazquez today at  1:40 PM EDT by a video enabled telemedicine application or telephone and verified that I am speaking with the correct person using two identifiers. Location patient: home Location provider: work or home office Persons participating in the virtual visit: patient, provider  I discussed the limitations, risks, security and privacy concerns of performing an evaluation and management service by telephone and the availability of in person appointments. I also discussed with the patient that there may be a patient responsible charge related to this service. The patient expressed understanding and agreed to proceed.  HPI:  Patient and I connected via video due to complaint of left-sided jaw pain and suspected dental infection.  Patient has been dealing with cracked and very worn down teeth as well as untreated cavities for many months now.   Patient did see dentist, but has recently been unable to afford dental care.  Patient states she was told she would need to have $288 per to the dentist seeing her again.  Patient states she cannot afford that at this time.  Patient states she is aware that she most likely will need to have multiple teeth extracted cavities filled.   ROS: See pertinent positives and negatives per HPI.  Past Medical History:  Diagnosis Date  . Acute right-sided low back pain with right-sided sciatica 10/23/2017  . Anemia   . Bilateral renal cysts   . CAD  (coronary artery disease) 09/28/2016  . Chest pain 10/14/2015  . Chronic back pain    "upper and lower" (09/19/2014)  . Chronic shoulder pain (Location of Primary Source of Pain) (Left) 01/29/2016  . Colitis 04/08/2016  . Coronary vasospasm ([place]) 12/09/2014  . Depression    "major" (09/19/2014)  . DVT (deep venous thrombosis) ([place])   . Esophageal spasm   . Essential hypertension 10/28/2013  . Gallstones   . Gastritis   . Gastroenteritis   . GERD (gastroesophageal reflux disease)   . Headache    "couple /month lately" (09/19/2014)  . Heart disease 12/09/2016  . High blood pressure   . High cholesterol   . History of blood transfusion 1985   related to hysterectomy  . History of nuclear stress test    Myoview 1/19:  EF 65, no ischemia or scar  . History of pulmonary embolus (PE) 09/19/2014  . 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 Rule out Paget's disease of the bone with blood work.   . Hyperlipidemia 10/28/2013  . Hypertension   . IBS (irritable bowel syndrome)   . Lumbar central spinal stenosis (moderate at L4-5; mild at L2-3 and L3-4) 01/29/2016  . Migraine    "a few times/yr" (09/19/2014)  . Myocardial infarction ([place]) 10/2010 X 3   "while hospitalized"  . Neck pain 01/29/2016  . Osteoarthritis    "qwhere; mostly around my joints" (09/19/2014)  . Pleural effusion, bilateral 07/31/2015  . Pneumonia 04/2014  . PTSD (post-traumatic stress disorder)   . Pulmonary embolism ([place])  04/2001; 09/19/2014   "after gallbladder OR; "  . Schizoaffective disorder ([place])   . Schizoaffective disorder, depressive type ([place]) 08/28/2016  . Sleep apnea    "mild" (09/19/2014)  . Snoring    a. sleep study 5/16: No OSA  . SOB (shortness of breath) 07/31/2015  . Spinal stenosis   . Spondylosis   . Suicide attempt ([place]) 08/27/2016  . Type II diabetes mellitus ([place])    "dx'd in 2007; lost weight; no RX for ~ 4 yr now" (09/19/2014)  . Wheezing 02/25/2017    Past Surgical History:   Procedure Laterality Date  . ABDOMINAL HYSTERECTOMY  1985  . ANTERIOR CERVICAL DECOMP/DISCECTOMY FUSION  10/2012   in [place] C5-C7   . APPENDECTOMY  1985  . BACK SURGERY    . BREAST CYST EXCISION Right   . BREAST EXCISIONAL BIOPSY Right YRS AGO   NEG  . CARDIAC CATHETERIZATION   2000's; 2009  . CHOLECYSTECTOMY  2002  . COLONOSCOPY WITH PROPOFOL N/A 12/12/2016   Procedure: COLONOSCOPY WITH PROPOFOL;  Surgeon: [name], MD;  Location: [facility] ENDOSCOPY;  Service: Endoscopy;  Laterality: N/A;  . COLONOSCOPY WITH PROPOFOL N/A 02/17/2017   Procedure: COLONOSCOPY WITH PROPOFOL;  Surgeon: [name], MD;  Location: [facility] ENDOSCOPY;  Service: Gastroenterology;  Laterality: N/A;  . ESOPHAGOGASTRODUODENOSCOPY (EGD) WITH PROPOFOL N/A 02/17/2017   Procedure: ESOPHAGOGASTRODUODENOSCOPY (EGD) WITH PROPOFOL;  Surgeon: [name], MD;  Location: [facility] ENDOSCOPY;  Service: Gastroenterology;  Laterality: N/A;  . ESOPHAGOGASTRODUODENOSCOPY (EGD) WITH PROPOFOL N/A 03/07/2019   Procedure: ESOPHAGOGASTRODUODENOSCOPY (EGD) WITH PROPOFOL;  Surgeon: [name], MD;  Location: [facility] ENDOSCOPY;  Service: Gastroenterology;  Laterality: N/A;  . EXCISION/RELEASE BURSA HIP Right 12/1984  . HERNIA REPAIR  1985  . LEFT HEART CATHETERIZATION WITH CORONARY ANGIOGRAM N/A 12/09/2014   Procedure: LEFT HEART CATHETERIZATION WITH CORONARY ANGIOGRAM;  Surgeon: Burnell Blanks, MD;  Location: [facility] CATH LAB;  Service: Cardiovascular;  Laterality: N/A;  . TONSILLECTOMY AND ADENOIDECTOMY  ~ 1968  . TOTAL HIP ARTHROPLASTY Left 04-06-2014  . UMBILICAL HERNIA REPAIR  1985  . VENA CAVA FILTER PLACEMENT  03/2014    Family History  Problem Relation Age of Onset  . Stroke Mother        Deceased  . Lung cancer Father        Deceased  . Healthy Daughter   . Healthy Son        x 2  . Other Son        Suicide  . Cancer Brother   . Heart attack Neg Hx    Social History   Tobacco Use  . Smoking status: Former Smoker     Packs/day: 1.50    Years: 10.00    Pack years: 15.00    Types: Cigarettes    Quit date: 04/12/1979    Years since quitting: 40.1  . Smokeless tobacco: Never Used  Substance Use Topics  . Alcohol use: No    Alcohol/week: 0.0 standard drinks   Social History   Tobacco Use  . Smoking status: Former Smoker    Packs/day: 1.50    Years: 10.00    Pack years: 15.00    Types: Cigarettes    Quit date: 04/12/1979    Years since quitting: 40.1  . Smokeless tobacco: Never Used  Substance Use Topics  . Alcohol use: No    Alcohol/week: 0.0 standard drinks    Current Outpatient Medications:  .  acetaminophen (TYLENOL) 500 MG tablet, Take 1 tablet (500 mg total) by  mouth every 6 (six) hours as needed., Disp: 30 tablet, Rfl: 0 .  baclofen (LIORESAL) 10 MG tablet, Take 1 tablet (10 mg total) by mouth 3 (three) times daily as needed for muscle spasms., Disp: 60 each, Rfl: 1 .  blood glucose meter kit and supplies KIT, Dispense based on patient and insurance preference. Use once daily. Dx code E11.9., Disp: 1 each, Rfl: 0 .  carvedilol (COREG) 3.125 MG tablet, Take 1 tablet (3.125 mg total) by mouth 2 (two) times daily with a meal., Disp: 60 tablet, Rfl: 2 .  cetirizine (ZYRTEC) 10 MG tablet, Take 1 tablet (10 mg total) by mouth daily., Disp: 30 tablet, Rfl: 11 .  diclofenac sodium (VOLTAREN) 1 % GEL, Apply 2 g topically 4 (four) times daily. Right arm/shoulder arthritis, Disp: 1 Tube, Rfl: 2 .  dicyclomine (BENTYL) 20 MG tablet, TAKE 1 TABLET BY MOUTH THREE TIMES A DAY AS NEEDED FOR MUSCLE SPASMS, Disp: 270 tablet, Rfl: 0 .  diphenhydrAMINE (BENADRYL) 25 MG tablet, Take 1 tablet (25 mg total) by mouth every 6 (six) hours., Disp: 12 tablet, Rfl: 0 .  ELIQUIS 5 MG TABS tablet, TAKE 1 TABLET BY MOUTH TWICE A DAY, Disp: 60 tablet, Rfl: 5 .  empagliflozin (JARDIANCE) 10 MG TABS tablet, Take 10 mg by mouth daily., Disp: 90 tablet, Rfl: 1 .  enoxaparin (LOVENOX) 100 MG/ML injection, Inject 1 mL (100 mg  total) into the skin every 12 (twelve) hours. See instructions provided on Mychart., Disp: 7 mL, Rfl: 0 .  escitalopram (LEXAPRO) 20 MG tablet, Take 1 tablet (20 mg total) by mouth daily., Disp: 90 tablet, Rfl: 1 .  famotidine (PEPCID) 20 MG tablet, TAKE 1 TABLET BY MOUTH EVERYDAY AT BEDTIME, Disp: 90 tablet, Rfl: 0 .  furosemide (LASIX) 20 MG tablet, TAKE 1 TABLET (20 MG TOTAL) BY MOUTH DAILY AS NEEDED FOR EDEMA., Disp: 90 tablet, Rfl: 1 .  gabapentin (NEURONTIN) 600 MG tablet, Take 1-1.5 tablets (600-900 mg total) by mouth 2 (two) times daily. Take 1 tablet in the AM and 1.5 tablet at bedtime, Disp: 225 tablet, Rfl: 1 .  isosorbide mononitrate (IMDUR) 30 MG 24 hr tablet, TAKE 1.5 TABLETS (45 MG TOTAL) BY MOUTH DAILY., Disp: 135 tablet, Rfl: 1 .  JANUVIA 100 MG tablet, TAKE 1 TABLET BY MOUTH EVERY DAY, Disp: 90 tablet, Rfl: 1 .  KLOR-CON M20 20 MEQ tablet, TAKE 1 TABLET (20 MEQ TOTAL) BY MOUTH DAILY AS NEEDED (TAKE WITH LASIX)., Disp: 90 tablet, Rfl: 1 .  levothyroxine (SYNTHROID) 25 MCG tablet, TAKE 1 TABLET BY MOUTH BEFORE BREAKFAST 2-3 HOURS BEFORE TAKING ANY OTHER MEDICATION OR SUPPLEMENT, Disp: 90 tablet, Rfl: 3 .  lidocaine (LIDODERM) 5 %, Place 1 patch onto the skin daily. Remove & Discard patch within 12 hours., Disp: 30 patch, Rfl: 0 .  Multiple Vitamins-Minerals (SUPER THERA VITE M) TABS, Take by mouth., Disp: , Rfl:  .  NIFEdipine (PROCARDIA) 10 MG capsule, TAKE 1 CAPSULE BY MOUTH 2 TIMES DAILY. TAKE 30 MINS BEFORE MEALS UP TO 1X PER DAY, Disp: 180 capsule, Rfl: 1 .  nitroGLYCERIN (NITROSTAT) 0.4 MG SL tablet, Place 1 tablet (0.4 mg total) under the tongue every 5 (five) minutes as needed for chest pain., Disp: 25 tablet, Rfl: 1 .  OLANZapine (ZYPREXA) 10 MG tablet, TAKE 1 TABLET BY MOUTH EVERYDAY AT BEDTIME, Disp: 90 tablet, Rfl: 1 .  ondansetron (ZOFRAN) 4 MG tablet, TAKE 1 TABLET (4 MG TOTAL) BY MOUTH 2 (TWO) TIMES DAILY AS NEEDED  FOR NAUSEA OR VOMITING., Disp: 30 tablet, Rfl: 0 .   oxyCODONE-acetaminophen (PERCOCET) 5-325 MG tablet, Take 1 tablet by mouth 2 (two) times daily as needed for severe pain., Disp: 10 tablet, Rfl: 0 .  pantoprazole (PROTONIX) 40 MG tablet, TAKE 1 TABLET BY MOUTH EVERY DAY, Disp: 90 tablet, Rfl: 1 .  pravastatin (PRAVACHOL) 10 MG tablet, TAKE 1 TABLET BY MOUTH EVERYDAY AT BEDTIME, Disp: 90 tablet, Rfl: 1  EXAM:  GENERAL: alert, oriented, appears well and in no acute distress  HEENT: atraumatic, conjunttiva clear, no obvious abnormalities on inspection of external nose and ears  NECK: normal movements of the head and neck  LUNGS: on inspection no signs of respiratory distress, breathing rate appears normal, no obvious gross SOB, gasping or wheezing  CV: no obvious cyanosis  MS: moves all visible extremities without noticeable abnormality  PSYCH/NEURO: pleasant and cooperative, no obvious depression or anxiety, speech and thought processing grossly intact  ASSESSMENT AND PLAN:  Discussed the following assessment and plan:  We will treat patient with course of penicillin to cover for dental infection.  Encouraged to reach out to different dental practices and inquire about payment plans and if they would be willing to work with her in regards to cost.  I will also reach out to our RN and pharmacist to see if they know of any way that we can connect patient with a navigator to help her find a dentist/oral surgeon that can help.  1. Dental infection  - penicillin v potassium (VEETID) 500 MG tablet; Take 1 tablet (500 mg total) by mouth 4 (four) times daily.  Dispense: 40 tablet; Refill: 0 - traMADol (ULTRAM) 50 MG tablet; Take 1 tablet (50 mg total) by mouth every 8 (eight) hours as needed for up to 5 days.  Dispense: 15 tablet; Refill: 0  2. Pain, dental  - penicillin v potassium (VEETID) 500 MG tablet; Take 1 tablet (500 mg total) by mouth 4 (four) times daily.  Dispense: 40 tablet; Refill: 0 - traMADol (ULTRAM) 50 MG tablet; Take 1  tablet (50 mg total) by mouth every 8 (eight) hours as needed for up to 5 days.  Dispense: 15 tablet; Refill: 0    I discussed the assessment and treatment plan with the patient. The patient was provided an opportunity to ask questions and all were answered. The patient agreed with the plan and demonstrated an understanding of the instructions.   The patient was advised to call back or seek an in-person evaluation if the symptoms worsen or if the condition fails to improve as anticipated.  Jodelle Green, FNP

## 2019-05-23 NOTE — Telephone Encounter (Signed)
Patient needing low cost dentist or dentist that will do payment plans with her  I did find:  [facility] on [address] in [place]  Not sure if you all had any other ideas or places she could call/go?  Thanks!  LG

## 2019-05-23 NOTE — Telephone Encounter (Signed)
Placed referral for [facility] for dental care. They are experts at community resources in the area.

## 2019-05-23 NOTE — Telephone Encounter (Signed)
See appt note patient has no money for dentist and ha had recent COVID screening for symptoms cannot come into office,scheduled with NP for 1040 today.FYI

## 2019-05-23 NOTE — Telephone Encounter (Signed)
Summary: tooth pain    Pt called and stated that she is having tooth pain and would like a call back from the nurse to discuss.     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 Patient states they charge and she does not have the money at this time. Patient has severe pain and abscess in mouth. Cal to office for appointment. (Patient sates she is following up with GYN for cyst on ovaries- she will be discussing surgery.) Reason for Disposition . [1] SEVERE pain (e.g., excruciating, unable to do any normal activities) AND [2] not improved 2 hours after pain medicine  Answer Assessment - Initial Assessment Questions 1. LOCATION: "Which tooth is hurting?"  (e.g., right-side/left-side, upper/lower, front/back)     Front on left and molar in back left side, Abcess reported roof of mouth to left and another on r on gum near tooth that had disintegrated  2. ONSET: "When did the toothache start?"  (e.g., hours, days)      Started Friday- slowly progressed 3. SEVERITY: "How bad is the toothache?"  (Scale 1-10; mild, moderate or severe)   - MILD (1-3): doesn't interfere with chewing    - MODERATE (4-7): interferes with chewing, interferes with normal activities, awakens from sleep     - SEVERE (8-10): unable to eat, unable to do any normal activities, excruciating pain        Severe- 8  4. SWELLING: "Is there any visible swelling of your face?"     A little swelling around nose and under eye on left 5. OTHER SYMPTOMS: "Do you have any other symptoms?" (e.g., fever)     Not sure 6. PREGNANCY: "Is there any chance you are pregnant?" "When was your last menstrual period?"     n/a  Protocols used: TOOTHACHE-A-AH

## 2019-05-24 ENCOUNTER — Other Ambulatory Visit: Payer: Self-pay

## 2019-05-24 ENCOUNTER — Encounter: Payer: Self-pay | Admitting: Emergency Medicine

## 2019-05-24 ENCOUNTER — Ambulatory Visit (INDEPENDENT_AMBULATORY_CARE_PROVIDER_SITE_OTHER): Payer: Medicare HMO | Admitting: Family Medicine

## 2019-05-24 ENCOUNTER — Emergency Department
Admission: EM | Admit: 2019-05-24 | Discharge: 2019-05-24 | Disposition: A | Discharge status: Home / Self Care | Payer: Medicare HMO | Attending: Emergency Medicine | Admitting: Emergency Medicine

## 2019-05-24 ENCOUNTER — Telehealth: Payer: Self-pay

## 2019-05-24 ENCOUNTER — Encounter: Payer: Self-pay | Admitting: Family Medicine

## 2019-05-24 DIAGNOSIS — I2510 Atherosclerotic heart disease of native coronary artery without angina pectoris: Secondary | ICD-10-CM | POA: Insufficient documentation

## 2019-05-24 DIAGNOSIS — Z79899 Other long term (current) drug therapy: Secondary | ICD-10-CM | POA: Diagnosis not present

## 2019-05-24 DIAGNOSIS — K0889 Other specified disorders of teeth and supporting structures: Secondary | ICD-10-CM

## 2019-05-24 DIAGNOSIS — Z7901 Long term (current) use of anticoagulants: Secondary | ICD-10-CM | POA: Insufficient documentation

## 2019-05-24 DIAGNOSIS — E119 Type 2 diabetes mellitus without complications: Secondary | ICD-10-CM | POA: Diagnosis not present

## 2019-05-24 DIAGNOSIS — Z87891 Personal history of nicotine dependence: Secondary | ICD-10-CM | POA: Diagnosis not present

## 2019-05-24 DIAGNOSIS — I10 Essential (primary) hypertension: Secondary | ICD-10-CM | POA: Diagnosis not present

## 2019-05-24 DIAGNOSIS — K047 Periapical abscess without sinus: Secondary | ICD-10-CM

## 2019-05-24 DIAGNOSIS — Z96642 Presence of left artificial hip joint: Secondary | ICD-10-CM | POA: Insufficient documentation

## 2019-05-24 DIAGNOSIS — K029 Dental caries, unspecified: Secondary | ICD-10-CM | POA: Diagnosis not present

## 2019-05-24 DIAGNOSIS — R079 Chest pain, unspecified: Secondary | ICD-10-CM | POA: Diagnosis not present

## 2019-05-24 DIAGNOSIS — R220 Localized swelling, mass and lump, head: Secondary | ICD-10-CM

## 2019-05-24 MED ORDER — SODIUM CHLORIDE 0.9 % IV SOLN
1.0000 g | Freq: Once | INTRAVENOUS | Status: AC
  Administered 2019-05-24: 1 g via INTRAVENOUS
  Filled 2019-05-24: qty 10

## 2019-05-24 MED ORDER — ONDANSETRON HCL 4 MG/2ML IJ SOLN
4.0000 mg | Freq: Once | INTRAMUSCULAR | Status: AC
  Administered 2019-05-24: 4 mg via INTRAVENOUS
  Filled 2019-05-24: qty 2

## 2019-05-24 MED ORDER — DEXAMETHASONE SODIUM PHOSPHATE 10 MG/ML IJ SOLN
10.0000 mg | Freq: Once | INTRAMUSCULAR | Status: AC
  Administered 2019-05-24: 10 mg via INTRAVENOUS
  Filled 2019-05-24: qty 1

## 2019-05-24 NOTE — ED Triage Notes (Signed)
Pt in via POV, reports increasing dental pain and swelling.  Pt reports starting Penicillin yesterday.  Vitals WDL.  NAD noted at this time.

## 2019-05-24 NOTE — Telephone Encounter (Signed)
Patient was seen by FNP today.

## 2019-05-24 NOTE — Progress Notes (Signed)
Patient ID: Valerie Vazquez, female   DOB: 06-Jul-1956, 63 y.o.   MRN: [id number]    Virtual Visit via video Note  This visit type was conducted due to national recommendations for restrictions regarding the COVID-19 pandemic (e.g. social distancing).  This format is felt to be most appropriate for this patient at this time.  All issues noted in this document were discussed and addressed.  No physical exam was performed (except for noted visual exam findings with Video Visits).   I connected with Valerie Vazquez today at 11:00 AM EDT by a video enabled telemedicine application and verified that I am speaking with the correct person using two identifiers. Location patient: home Location provider: work or home office Persons participating in the virtual visit: patient, provider  I discussed the limitations, risks, security and privacy concerns of performing an evaluation and management service by video and the availability of in person appointments. I also discussed with the patient that there may be a patient responsible charge related to this service. The patient expressed understanding and agreed to proceed.  HPI:  Patient and I connected via video due to complaint of swelling in face - upper lip and around nose & worsening pain in jaw.  Pain yesterday was more on the right side of mouth across all of her jaw and she started noticed swelling this morning upon waking up.  She did start penicillin yesterday as prescribed.  Having some fever off and on, temperatures ranging from mid 99 to 100.7 F.  Has been using Tylenol to keep fever down.  Denies any swelling in throat or difficulty breathing.  No rash or itching. States she does not believe that the swelling of lips and face is related to allergic reaction, believes it is related to her dental infection.   ROS: See pertinent positives and negatives per HPI.  Past Medical History:  Diagnosis Date   Acute right-sided low back pain with  right-sided sciatica 10/23/2017   Anemia    Bilateral renal cysts    CAD (coronary artery disease) 09/28/2016   Chest pain 10/14/2015   Chronic back pain    "upper and lower" (09/19/2014)   Chronic shoulder pain (Location of Primary Source of Pain) (Left) 01/29/2016   Colitis 04/08/2016   Coronary vasospasm ([place]) 12/09/2014   Depression    "major" (09/19/2014)   DVT (deep venous thrombosis) (HCC)    Esophageal spasm    Essential hypertension 10/28/2013   Gallstones    Gastritis    Gastroenteritis    GERD (gastroesophageal reflux disease)    Headache    "couple /month lately" (09/19/2014)   Heart disease 12/09/2016   High blood pressure    High cholesterol    History of blood transfusion 1985   related to hysterectomy   History of nuclear stress test    Myoview 1/19:  EF 65, no ischemia or scar   History of pulmonary embolus (PE) 09/19/2014   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 Rule out Paget's disease of the bone with blood work.    Hyperlipidemia 10/28/2013   Hypertension    IBS (irritable bowel syndrome)    Lumbar central spinal stenosis (moderate at L4-5; mild at L2-3 and L3-4) 01/29/2016   Migraine    "a few times/yr" (09/19/2014)   Myocardial infarction ([place]) 10/2010 X 3   "while hospitalized"   Neck pain 01/29/2016   Osteoarthritis    "qwhere; mostly around my joints" (09/19/2014)   Pleural effusion, bilateral  07/31/2015   Pneumonia 04/2014   PTSD (post-traumatic stress disorder)    Pulmonary embolism ([place]) 04/2001; 09/19/2014   "after gallbladder OR; "   Schizoaffective disorder ([place])    Schizoaffective disorder, depressive type ([place]) 08/28/2016   Sleep apnea    "mild" (09/19/2014)   Snoring    a. sleep study 5/16: No OSA   SOB (shortness of breath) 07/31/2015   Spinal stenosis    Spondylosis    Suicide attempt ([place]) 08/27/2016   Type II diabetes mellitus ([place])    "dx'd in 2007; lost weight; no RX for ~ 4  yr now" (09/19/2014)   Wheezing 02/25/2017    Past Surgical History:  Procedure Laterality Date   [place] DECOMP/DISCECTOMY FUSION  10/2012   in [place] C5-C7    [place] Right    BREAST EXCISIONAL BIOPSY Right YRS AGO   NEG   CARDIAC CATHETERIZATION   2000's; 2009   CHOLECYSTECTOMY  2002   COLONOSCOPY WITH PROPOFOL N/A 12/12/2016   Procedure: COLONOSCOPY WITH PROPOFOL;  Surgeon: [name], MD;  Location: [facility] ENDOSCOPY;  Service: Endoscopy;  Laterality: N/A;   COLONOSCOPY WITH PROPOFOL N/A 02/17/2017   Procedure: COLONOSCOPY WITH PROPOFOL;  Surgeon: [name], MD;  Location: [facility] ENDOSCOPY;  Service: Gastroenterology;  Laterality: N/A;   ESOPHAGOGASTRODUODENOSCOPY (EGD) WITH PROPOFOL N/A 02/17/2017   Procedure: ESOPHAGOGASTRODUODENOSCOPY (EGD) WITH PROPOFOL;  Surgeon: [name], MD;  Location: [facility] ENDOSCOPY;  Service: Gastroenterology;  Laterality: N/A;   ESOPHAGOGASTRODUODENOSCOPY (EGD) WITH PROPOFOL N/A 03/07/2019   Procedure: ESOPHAGOGASTRODUODENOSCOPY (EGD) WITH PROPOFOL;  Surgeon: [name], MD;  Location: [facility] ENDOSCOPY;  Service: Gastroenterology;  Laterality: N/A;   EXCISION/RELEASE BURSA HIP Right 12/1984   HERNIA REPAIR  1985   LEFT HEART CATHETERIZATION WITH CORONARY ANGIOGRAM N/A 12/09/2014   Procedure: LEFT HEART CATHETERIZATION WITH CORONARY ANGIOGRAM;  Surgeon: Burnell Blanks, MD;  Location: [facility] CATH LAB;  Service: Cardiovascular;  Laterality: N/A;   TONSILLECTOMY AND ADENOIDECTOMY  ~ 1968   TOTAL HIP ARTHROPLASTY Left [id number]   UMBILICAL HERNIA REPAIR  1985   VENA CAVA FILTER PLACEMENT  03/2014    Family History  Problem Relation Age of Onset   Stroke Mother        Deceased   Lung cancer Father        Deceased   Healthy Daughter    Healthy Son        x 2   Other Son        Suicide   Cancer Brother    Heart attack Neg Hx     Social History   Tobacco Use   Smoking status: Former Smoker    Packs/day: 1.50    Years: 10.00    Pack years: 15.00    Types: Cigarettes    Quit date: 04/12/1979    Years since quitting: 40.1   Smokeless tobacco: Never Used  Substance Use Topics   Alcohol use: No    Alcohol/week: 0.0 standard drinks    Current Outpatient Medications:    acetaminophen (TYLENOL) 500 MG tablet, Take 1 tablet (500 mg total) by mouth every 6 (six) hours as needed., Disp: 30 tablet, Rfl: 0   baclofen (LIORESAL) 10 MG tablet, Take 1 tablet (10 mg total) by mouth 3 (three) times daily as needed for muscle spasms., Disp: 60 each, Rfl: 1   blood glucose meter kit and supplies KIT, Dispense based on  patient and insurance preference. Use once daily. Dx code E11.9., Disp: 1 each, Rfl: 0   carvedilol (COREG) 3.125 MG tablet, Take 1 tablet (3.125 mg total) by mouth 2 (two) times daily with a meal., Disp: 60 tablet, Rfl: 2   cetirizine (ZYRTEC) 10 MG tablet, Take 1 tablet (10 mg total) by mouth daily., Disp: 30 tablet, Rfl: 11   diclofenac sodium (VOLTAREN) 1 % GEL, Apply 2 g topically 4 (four) times daily. Right arm/shoulder arthritis, Disp: 1 Tube, Rfl: 2   dicyclomine (BENTYL) 20 MG tablet, TAKE 1 TABLET BY MOUTH THREE TIMES A DAY AS NEEDED FOR MUSCLE SPASMS, Disp: 270 tablet, Rfl: 0   diphenhydrAMINE (BENADRYL) 25 MG tablet, Take 1 tablet (25 mg total) by mouth every 6 (six) hours., Disp: 12 tablet, Rfl: 0   ELIQUIS 5 MG TABS tablet, TAKE 1 TABLET BY MOUTH TWICE A DAY, Disp: 60 tablet, Rfl: 5   empagliflozin (JARDIANCE) 10 MG TABS tablet, Take 10 mg by mouth daily., Disp: 90 tablet, Rfl: 1   enoxaparin (LOVENOX) 100 MG/ML injection, Inject 1 mL (100 mg total) into the skin every 12 (twelve) hours. See instructions provided on Mychart., Disp: 7 mL, Rfl: 0   escitalopram (LEXAPRO) 20 MG tablet, Take 1 tablet (20 mg total) by mouth daily., Disp: 90 tablet, Rfl: 1   famotidine (PEPCID) 20 MG tablet,  TAKE 1 TABLET BY MOUTH EVERYDAY AT BEDTIME, Disp: 90 tablet, Rfl: 0   furosemide (LASIX) 20 MG tablet, TAKE 1 TABLET (20 MG TOTAL) BY MOUTH DAILY AS NEEDED FOR EDEMA., Disp: 90 tablet, Rfl: 1   gabapentin (NEURONTIN) 600 MG tablet, Take 1-1.5 tablets (600-900 mg total) by mouth 2 (two) times daily. Take 1 tablet in the AM and 1.5 tablet at bedtime, Disp: 225 tablet, Rfl: 1   isosorbide mononitrate (IMDUR) 30 MG 24 hr tablet, TAKE 1.5 TABLETS (45 MG TOTAL) BY MOUTH DAILY., Disp: 135 tablet, Rfl: 1   JANUVIA 100 MG tablet, TAKE 1 TABLET BY MOUTH EVERY DAY, Disp: 90 tablet, Rfl: 1   KLOR-CON M20 20 MEQ tablet, TAKE 1 TABLET (20 MEQ TOTAL) BY MOUTH DAILY AS NEEDED (TAKE WITH LASIX)., Disp: 90 tablet, Rfl: 1   levothyroxine (SYNTHROID) 25 MCG tablet, TAKE 1 TABLET BY MOUTH BEFORE BREAKFAST 2-3 HOURS BEFORE TAKING ANY OTHER MEDICATION OR SUPPLEMENT, Disp: 90 tablet, Rfl: 3   lidocaine (LIDODERM) 5 %, Place 1 patch onto the skin daily. Remove & Discard patch within 12 hours., Disp: 30 patch, Rfl: 0   Multiple Vitamins-Minerals (SUPER THERA VITE M) TABS, Take by mouth., Disp: , Rfl:    NIFEdipine (PROCARDIA) 10 MG capsule, TAKE 1 CAPSULE BY MOUTH 2 TIMES DAILY. TAKE 30 MINS BEFORE MEALS UP TO 1X PER DAY, Disp: 180 capsule, Rfl: 1   nitroGLYCERIN (NITROSTAT) 0.4 MG SL tablet, Place 1 tablet (0.4 mg total) under the tongue every 5 (five) minutes as needed for chest pain., Disp: 25 tablet, Rfl: 1   OLANZapine (ZYPREXA) 10 MG tablet, TAKE 1 TABLET BY MOUTH EVERYDAY AT BEDTIME, Disp: 90 tablet, Rfl: 1   ondansetron (ZOFRAN) 4 MG tablet, TAKE 1 TABLET (4 MG TOTAL) BY MOUTH 2 (TWO) TIMES DAILY AS NEEDED FOR NAUSEA OR VOMITING., Disp: 30 tablet, Rfl: 0   pantoprazole (PROTONIX) 40 MG tablet, TAKE 1 TABLET BY MOUTH EVERY DAY, Disp: 90 tablet, Rfl: 1   penicillin v potassium (VEETID) 500 MG tablet, Take 1 tablet (500 mg total) by mouth 4 (four) times daily., Disp: 40 tablet, Rfl:  0   pravastatin  (PRAVACHOL) 10 MG tablet, TAKE 1 TABLET BY MOUTH EVERYDAY AT BEDTIME, Disp: 90 tablet, Rfl: 1   traMADol (ULTRAM) 50 MG tablet, Take 1 tablet (50 mg total) by mouth every 8 (eight) hours as needed for up to 5 days., Disp: 15 tablet, Rfl: 0  EXAM:  GENERAL: alert, oriented, appears in no acute resp distress  HEENT: atraumatic, conjunttiva clear, no obvious abnormalities on inspection of external nose and ears.  Over video I can tell patients face around nose and upper lip is visibly swollen.  NECK: normal movements of the head and neck  LUNGS: Speaking in full sentences, on inspection no signs of respiratory distress, breathing rate appears normal, no obvious gross SOB, gasping, coughing or wheezing  CV: no obvious cyanosis  MS: moves all visible extremities without noticeable abnormality  PSYCH/NEURO: pleasant and cooperative, no obvious depression or anxiety, speech and thought processing grossly intact  ASSESSMENT AND PLAN:  Discussed the following assessment and plan:  Dental infection, dental pain, with facial swelling- advised patient that due to the facial swelling be present I strongly recommend she go right to emergency room for evaluation.  Advised that sometimes infections may be too advanced to respond to oral antibiotics and IV antibiotics might be required.  Also advised anytime there is facial swelling involved, we become concerned for severe infection.  Patient verbalizes understanding and states she will go over to emergency room after our video call is complete.   I discussed the assessment and treatment plan with the patient. The patient was provided an opportunity to ask questions and all were answered. The patient agreed with the plan and demonstrated an understanding of the instructions.   The patient was advised to call back or seek an in-person evaluation if the symptoms worsen or if the condition fails to improve as anticipated.   Jodelle Green, FNP

## 2019-05-24 NOTE — ED Notes (Signed)
See triage note  Presents with increased dental pain   States she was started on Pcn yesterday

## 2019-05-24 NOTE — Telephone Encounter (Signed)
Copied from [place] [phone]. Topic: Referral - Status >> May 24, 2019 [id number] PM Simone Curia D wrote: [id number] Spoke with patient about area dentist that accept Medicare and can see her quickly.  Patient was on her way to the ER. Emailed information to patient with her permission. Will follow up by end of the week. Ambrose Mantle [phone]

## 2019-05-24 NOTE — Telephone Encounter (Signed)
Has developed swelling yesterday - top lip and left of her nose. No redness, unsure of fever. Last night had  A fever of 100.Concerned and would like a provider's opinion. Warm transfer to [place] in the practice.

## 2019-05-24 NOTE — Telephone Encounter (Signed)
Pt had a Doxy visit with NP Philis Nettle @ 11:00am today

## 2019-05-24 NOTE — Telephone Encounter (Signed)
Great! Thank you!  LG

## 2019-05-24 NOTE — ED Provider Notes (Signed)
[facility] Emergency Department Provider Note   ____________________________________________   First MD Initiated Contact with Patient 05/24/19 1244     (approximate)  I have reviewed the triage vital signs and the nursing notes.   HISTORY  Chief Complaint Dental Pain    HPI Valerie Vazquez is a 63 y.o. female patient presents with dental pain for 4 days.  Patient is talked to her PCP on a telemedicine conference and was prescribed penicillin.  Patient continued had pain and swelling.  Patient has not sought dental care secondary to cash payment required before treatment.  Patient rates the pain as 4/10.  Patient described the pain is "achy".         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 Rule out Paget's disease of the bone with blood work.    Hyperlipidemia 10/28/2013   Hypertension    IBS (irritable bowel syndrome)    Lumbar central spinal stenosis (moderate at L4-5; mild at  L2-3 and L3-4) 01/29/2016   Migraine    "a few times/yr" (09/19/2014)   Myocardial infarction ([place]) 10/2010 X 3   "while hospitalized"   Neck pain 01/29/2016   Osteoarthritis    "qwhere; mostly around my joints" (09/19/2014)   Pleural effusion, bilateral 07/31/2015   Pneumonia 04/2014   PTSD (post-traumatic stress disorder)    Pulmonary embolism ([place]) 04/2001; 09/19/2014   "after gallbladder OR; "   Schizoaffective disorder ([place])    Schizoaffective disorder, depressive type ([place]) 08/28/2016   Sleep apnea    "mild" (09/19/2014)   Snoring    a. sleep study 5/16: No OSA   SOB (shortness of breath) 07/31/2015   Spinal stenosis    Spondylosis    Suicide attempt ([place]) 08/27/2016   Type II diabetes mellitus ([place])    "dx'd in 2007; lost weight; no RX for ~ 4 yr now" (09/19/2014)   Wheezing 02/25/2017    Patient Active Problem List   Diagnosis Date Noted   Left wrist pain 05/20/2019   Allergic rhinitis 01/27/2019   Right hip pain 10/06/2018   Oxygen saturation greater than 90% 09/28/2018   Ovarian cyst 09/02/2018   Orthostatic hypotension 03/21/2018   Boil 10/27/2017   Urinary incontinence 10/27/2017   Elevated alkaline phosphatase level 10/27/2017   Acute right-sided low back pain with right-sided sciatica 10/23/2017   Subclinical hypothyroidism 08/26/2017   Soft tissue swelling  05/29/2017   Diabetes mellitus without complication ([place]) 00/37/0488   Acute right eye pain 03/25/2017   Elevated glucose 02/25/2017   Bruise 02/25/2017   Paresthesia 02/06/2017   Falls 01/30/2017   Colon polyp 01/30/2017   Right upper quadrant pain 01/12/2017   Heart disease 12/09/2016   Plantar fasciitis of left foot 11/07/2016   Costochondritis 10/06/2016   CAD (coronary artery disease) 09/28/2016   Diarrhea 09/16/2016   Epigastric discomfort 09/16/2016   Schizoaffective disorder, depressive type ([place]) 08/28/2016   Suicide attempt ([place]) 08/27/2016    Colitis 04/08/2016   Absolute anemia 01/29/2016   Neck pain 01/29/2016   Chronic shoulder pain (Location of Primary Source of Pain) (Left) 01/29/2016   Osteoarthritis of hip (Bilateral) 01/29/2016   Lumbar central spinal stenosis (moderate at L4-5; mild at L2-3 and L3-4) 01/29/2016   Hx of cervical spine surgery 01/29/2016   Numbness 11/19/2015   Breast pain 10/19/2015   Chest pain 10/14/2015   SOB (shortness of breath) 07/31/2015   Pleural effusion, bilateral 07/31/2015   Esophageal spasm 06/05/2015   History of DVT (deep vein thrombosis) 02/25/2015   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   Constipation 12/12/2014   Coronary vasospasm ([place]) 12/09/2014   History of pulmonary embolus (PE) 09/19/2014   Anxiety and depression 04/13/2014   IBS (irritable bowel syndrome) 04/11/2014   GERD (gastroesophageal reflux disease) 04/11/2014   Insomnia 04/11/2014   Essential hypertension 10/28/2013   Anemia 10/28/2013   Hyperlipidemia 10/28/2013    Past Surgical History:  Procedure Laterality Date   ABDOMINAL HYSTERECTOMY  1985   ANTERIOR CERVICAL DECOMP/DISCECTOMY FUSION  10/2012   in [place] C5-C7    [facility]     BREAST CYST EXCISION Right    BREAST EXCISIONAL BIOPSY Right YRS AGO   NEG   CARDIAC CATHETERIZATION   2000's; 2009   CHOLECYSTECTOMY  2002   COLONOSCOPY WITH PROPOFOL N/A 12/12/2016   Procedure: COLONOSCOPY WITH PROPOFOL;  Surgeon: [name], MD;  Location: [facility] ENDOSCOPY;  Service: Endoscopy;  Laterality: N/A;   COLONOSCOPY WITH PROPOFOL N/A 02/17/2017   Procedure: COLONOSCOPY WITH PROPOFOL;  Surgeon: [name], MD;  Location: [facility] ENDOSCOPY;  Service: Gastroenterology;  Laterality: N/A;   ESOPHAGOGASTRODUODENOSCOPY (EGD) WITH PROPOFOL N/A 02/17/2017   Procedure: ESOPHAGOGASTRODUODENOSCOPY (EGD) WITH PROPOFOL;  Surgeon: [name], MD;  [facility] ENDOSCOPY;  Service: Gastroenterology;  Laterality: N/A;    ESOPHAGOGASTRODUODENOSCOPY (EGD) WITH PROPOFOL N/A 03/07/2019   Procedure: ESOPHAGOGASTRODUODENOSCOPY (EGD) WITH PROPOFOL;  Surgeon: [name], MD;  Location: [facility] END[facility]COPY;  Service: Gastroenterology;  Laterality: N/A;   EXCISION/RELEASE BURSA HIP Right 12/1984   HERNIA REPAIR  1985   LEFT HEART CATHETERIZATION WITH CORONARY ANGIOGRAM N/A 12/09/2014   Procedure: LEFT HEART CATHETERIZATION WITH CORONARY ANGIOGRAM;  Surgeon: Burnell Blanks, MD;  Location: [facility] CATH LAB;  Service: Cardiovascular;  Laterality: N/A;   TONSILLECTOMY AND ADENOIDECTOMY  ~ [place] ARTHROPLASTY Left [phone]   UMBILICAL HERNIA REPAIR  1985   VENA CAVA FILTER PLACEMENT  03/2014    Prior to Admission medications   Medication Sig Start Date End Date Taking? Authorizing Provider  acetaminophen (TYLENOL) 500 MG tablet Take 1 tablet (500 mg total) by mouth every 6 (six) hours as needed. 09/29/18   Laban Emperor, PA-C  baclofen (LIORESAL) 10 MG tablet Take 1 tablet (10 mg total) by mouth 3 (three) times daily as needed for muscle spasms. 05/12/19   [name], MD  blood glucose meter kit and supplies KIT  Dispense based on patient and insurance preference. Use once daily. Dx code E11.9. 04/01/19   [name], MD  carvedilol (COREG) 3.125 MG tablet Take 1 tablet (3.125 mg total) by mouth 2 (two) times daily with a meal. 05/11/19   Burnell Blanks, MD  cetirizine (ZYRTEC) 10 MG tablet Take 1 tablet (10 mg total) by mouth daily. 01/25/19   [name], MD  diclofenac sodium (VOLTAREN) 1 % GEL Apply 2 g topically 4 (four) times daily. Right arm/shoulder arthritis 04/08/18   McLean-Scocuzza, Nino Glow, MD  dicyclomine (BENTYL) 20 MG tablet TAKE 1 TABLET BY MOUTH THREE TIMES A DAY AS NEEDED FOR MUSCLE SPASMS 03/24/19   [name], MD  diphenhydrAMINE (BENADRYL) 25 MG tablet Take 1 tablet (25 mg total) by mouth every 6 (six) hours. 09/12/16   Jola Schmidt, MD  ELIQUIS 5 MG TABS tablet  TAKE 1 TABLET BY MOUTH TWICE A DAY 04/18/19   [name], MD  empagliflozin (JARDIANCE) 10 MG TABS tablet Take 10 mg by mouth daily. 03/10/19   [name], MD  escitalopram (LEXAPRO) 20 MG tablet Take 1 tablet (20 mg total) by mouth daily. 03/07/19   [name], MD  famotidine (PEPCID) 20 MG tablet TAKE 1 TABLET BY MOUTH EVERYDAY AT BEDTIME 05/12/19   Guse, Jacquelynn Cree, FNP  furosemide (LASIX) 20 MG tablet TAKE 1 TABLET (20 MG TOTAL) BY MOUTH DAILY AS NEEDED FOR EDEMA. 04/22/19   [name], MD  gabapentin (NEURONTIN) 600 MG tablet Take 1-1.5 tablets (600-900 mg total) by mouth 2 (two) times daily. Take 1 tablet in the AM and 1.5 tablet at bedtime 04/26/19   [name], MD  isosorbide mononitrate (IMDUR) 30 MG 24 hr tablet TAKE 1.5 TABLETS (45 MG TOTAL) BY MOUTH DAILY. 12/23/18   Burnell Blanks, MD  JANUVIA 100 MG tablet TAKE 1 TABLET BY MOUTH EVERY DAY 12/23/18   [name], MD  KLOR-CON M20 20 MEQ tablet TAKE 1 TABLET (20 MEQ TOTAL) BY MOUTH DAILY AS NEEDED (TAKE WITH LASIX). 04/22/19   [name], MD  levothyroxine (SYNTHROID) 25 MCG tablet TAKE 1 TABLET BY MOUTH BEFORE BREAKFAST 2-3 HOURS BEFORE TAKING ANY OTHER MEDICATION OR SUPPLEMENT 03/16/19   [name], MD  lidocaine (LIDODERM) 5 % Place 1 patch onto the skin daily. Remove & Discard patch within 12 hours. 10/06/18   [name], MD  Multiple Vitamins-Minerals (SUPER THERA VITE M) TABS Take by mouth.    [provider]  NIFEdipine (PROCARDIA) 10 MG capsule TAKE 1 CAPSULE BY MOUTH 2 TIMES DAILY. TAKE 30 MINS BEFORE MEALS UP TO 1X PER DAY 03/28/19   [name], MD  nitroGLYCERIN (NITROSTAT) 0.4 MG SL tablet Place 1 tablet (0.4 mg total) under the tongue every 5 (five) minutes as needed for chest pain. 05/11/19 08/09/19  Burnell Blanks, MD  OLANZapine (ZYPREXA) 10 MG tablet TAKE 1 TABLET BY MOUTH EVERYDAY AT BEDTIME 05/11/19   [name], MD  ondansetron  (ZOFRAN) 4 MG tablet TAKE 1 TABLET (4 MG TOTAL) BY MOUTH 2 (TWO) TIMES DAILY AS NEEDED FOR NAUSEA OR VOMITING. 05/12/19   [name], MD  pantoprazole (PROTONIX) 40 MG tablet TAKE 1 TABLET BY MOUTH EVERY DAY 05/03/19   [name], MD  penicillin v potassium (VEETID) 500 MG tablet Take 1 tablet (500 mg total) by mouth 4 (four) times daily. 05/23/19   Jodelle Green, FNP  pravastatin (PRAVACHOL) 10 MG tablet TAKE 1 TABLET BY MOUTH EVERYDAY AT BEDTIME 03/21/19   [name], MD  traMADol (ULTRAM) 50 MG tablet Take 1 tablet (50 mg total) by mouth every 8 (eight) hours as needed for up to 5 days. 05/23/19 05/28/19  Jodelle Green, FNP    Allergies Abilify [aripiprazole], Augmentin [amoxicillin-pot clavulanate], Bactrim [sulfamethoxazole-trimethoprim], Ceftin [cefuroxime axetil], Coconut oil, Diclofenac, Dye fdc red [red dye], Indocin [indomethacin], Linaclotide, Lisinopril, Morphine, Morphine and related, Simvastatin, Sulfa antibiotics, Sulfamethoxazole-trimethoprim, Wellbutrin [bupropion], Zetia [ezetimibe], Quetiapine fumarate, and Quetiapine fumarate  Family History  Problem Relation Age of Onset   Stroke Mother        Deceased   Lung cancer Father        Deceased   Healthy Daughter    Healthy Son        x 2   Other Son        Suicide   Cancer Brother    Heart attack Neg Hx     Social History Social History   Tobacco Use   Smoking status: Former Smoker    Packs/day: 1.50    Years: 10.00    Pack years: 15.00    Types: Cigarettes    Quit date: 04/12/1979    Years since quitting: 40.1   Smokeless tobacco: Never Used  Substance Use Topics   Alcohol use: No    Alcohol/week: 0.0 standard drinks   Drug use: No    Review of Systems Constitutional: No fever/chills Eyes: No visual changes. ENT: No sore throat.  Dental pain Cardiovascular: Intermitting chest pain. Respiratory: Denies shortness of breath. Gastrointestinal: No abdominal pain.  No nausea, no  vomiting.  No diarrhea.  No constipation. Genitourinary: Negative for dysuria. Musculoskeletal: Negative for back pain. Skin: Negative for rash. Neurological: Negative for headaches, focal weakness or numbness. Endocrine: Hypertension  Allergic/Immunilogical: See extensive medication list. ____________________________________________   PHYSICAL EXAM:  VITAL SIGNS: ED Triage Vitals [05/24/19 1224]  Enc Vitals Group     BP 97/77     Pulse Rate 92     Resp 16     Temp 98.9 F (37.2 C)     Temp src      SpO2 96 %     Weight 224 lb (101.6 kg)     Height 5' 6" (1.676 m)     Head Circumference      Peak Flow      Pain Score 4     Pain Loc      Pain Edu?      Excl. in [place]?     Constitutional: Alert and oriented. Well appearing and in no acute distress. Eyes: Conjunctivae are normal. PERRL. EOMI. Head: Atraumatic. Nose: No congestion/rhinnorhea. Mouth/Throat: Mucous membranes are moist.  Oropharynx non-erythematous.  Multiple dental caries and fractured teeth.  Gingiva edema. Neck: No stridor.   Hematological/Lymphatic/Immunilogical: No cervical lymphadenopathy. Cardiovascular: Normal rate, regular rhythm. Grossly normal heart sounds.  Good peripheral circulation. Respiratory: Normal respiratory effort.  No retractions. Lungs CTAB. Musculoskeletal: No lower extremity tenderness nor edema.  No joint effusions. Neurologic:  Normal speech and language. No gross focal neurologic deficits are appreciated. No gait instability. Skin:  Skin is warm, dry and intact. No rash noted. Psychiatric: Mood and affect are normal. Speech and behavior are normal.  ____________________________________________   LABS (all labs ordered are listed, but only abnormal results are displayed)  Labs Reviewed - No data to display ____________________________________________  EKG EKG read by heart station Dr. with  no acute findings.  ____________________________________________  [place]  ED MD  interpretation:    Official radiology report(s): No results found.  ____________________________________________   PROCEDURES  Procedure(s) performed (including Critical Care):  Procedures   ____________________________________________   INITIAL IMPRESSION / ASSESSMENT AND PLAN / ED COURSE  As part of my medical decision making, I reviewed the following data within the [place] was evaluated in Emergency Department on 05/24/2019 for the symptoms described in the history of present illness. She was evaluated in the context of the global COVID-19 pandemic, which necessitated consideration that the patient might be at risk for infection with the SARS-CoV-2 virus that causes COVID-19. Institutional protocols and algorithms that pertain to the evaluation of patients at risk for COVID-19 are in a state of rapid change based on information released by regulatory bodies including the CDC and federal and state organizations. These policies and algorithms were followed during the patient's care in the ED.  Presents with increased dental pain and oral edema.  Patient says she started taking penicillin yesterday.  Patient was given IV Rocephin and Decadron.  Patient advised to continue penicillin and follow-up with dentist from list of clinics provided on her discharge instructions.      ____________________________________________   FINAL CLINICAL IMPRESSION(S) / ED DIAGNOSES  Final diagnoses:  Infected dental caries     ED Discharge Orders    None       Note:  This document was prepared using Dragon voice recognition software and may include unintentional dictation errors.    Sable Feil, PA-C 05/24/19 1438    Nance Pear, MD 05/24/19 [phone]

## 2019-05-24 NOTE — Discharge Instructions (Addendum)
Here is a list of dental clinics that accept payment plans.  Continue previous antibiotics. OPTIONS FOR DENTAL FOLLOW UP CARE  [place] [facility] [place] [email or web address] [phone])  [facility] [phone])  [place] [phone] ext [phone])  [place] [phone])  [facility] [phone]) This clinic caters to the indigent population and is on a lottery system. Location: [employer] [facility], [place], [place], [place] Clinic Hours: Wednesdays from 6pm - 9pm, patients seen by a lottery system. For dates, call or go to [email or web address] Services: Cleanings, fillings and simple extractions. Payment Options: DENTAL WORK IS FREE OF CHARGE. Bring proof of income or support. Best way to get seen: Arrive at 5:15 pm - this is a lottery, NOT first come/first serve, so arriving earlier will not increase your chances of being seen.     [facility] [phone] Select option 1 for emergencies   Location: [facility], [place], [address] [place] Clinic Hours: No walk-ins accepted - call the day before to schedule an appointment. Check in times are 9:30 am and 1:30 pm. Services: Simple extractions, temporary fillings, pulpectomy/pulp debridement, uncomplicated abscess drainage. Payment Options: PAYMENT IS DUE AT THE TIME OF SERVICE.  Fee is usually $100-200, additional surgical procedures (e.g. abscess drainage) may be extra. Cash, checks, Visa/MasterCard accepted.  Can file Medicaid if patient is covered for dental - patient should call case worker to check. No discount for [facility] patients. Best way to get seen: MUST call the day before and get onto the schedule. Can usually be seen the next  1-2 days. No walk-ins accepted.     [place] [phone]   Location: [place], [place] Clinic Hours: M, W, Th, F 8am or 1:30pm, Tues 9a or 1:30 - first come/first served. Services: Simple extractions, temporary fillings, uncomplicated abscess drainage.  You do not need to be an [facility] resident. Payment Options: PAYMENT IS DUE AT THE TIME OF SERVICE. Dental insurance, otherwise sliding scale - bring proof of income or support. Depending on income and treatment needed, cost is usually $50-200. Best way to get seen: Arrive early as it is first come/first served.     [facility] [phone]   Location: [place] [facility] Hours: Mon-Thu 8a-5p Services: Most basic dental services including extractions and fillings. Payment Options: PAYMENT IS DUE AT THE TIME OF SERVICE. Sliding scale, up to 50% off - bring proof if income or support. Medicaid with dental option accepted. Best way to get seen: Call to schedule an appointment, can usually be seen within 2 weeks OR they will try to see walk-ins - show up at [place] or 2p (you may have to wait).     [facility] [place] RESIDENTS ONLY   Location: [facility] [place] [address], [place], [place] [address] Clinic Hours: By appointment only. Monday - Thursday 8am-5pm, Friday 8am-12pm Services: Cleanings, fillings, extractions. Payment Options: PAYMENT IS DUE AT THE TIME OF SERVICE. Cash, Visa or MasterCard. Sliding scale - $30 minimum per service. Best way to get seen: Come in to office, complete packet and make an appointment - need proof of income or support monies for each household member and proof of [facility] residence. Usually takes about a month to get in.  [facility] [phone]   Location: [address],  [place] Clinic Hours: Walk-in Urgent Care Dental Services are offered Monday-Friday mornings only. The numbers of emergencies accepted daily is limited to the number of providers available. Maximum 15 - Mondays, Wednesdays & Thursdays Maximum 10 - Tuesdays & Fridays Services: You do not need to be a [facility] resident to be seen for a dental emergency. Emergencies are defined as pain, swelling, abnormal bleeding, or dental trauma. Walkins will receive x-rays if needed. NOTE: Dental cleaning is not an emergency. Payment Options: PAYMENT IS DUE AT THE TIME OF SERVICE. Minimum co-pay is $40.00 for uninsured patients. Minimum co-pay is $3.00 for Medicaid with dental coverage. Dental Insurance is accepted and must be presented at time of visit. Medicare does not cover dental. Forms of payment: Cash, credit card, checks. Best way to get seen: If not previously registered with the clinic, walk-in dental registration begins at 7:15 am and is on a first come/first serve basis. If previously registered with the clinic, call to make an appointment.     [facility] [place] ONLY   Location: [address], [place], [place] Clinic Hours: Mon-Thu 10a-2p Services: Extractions only! Payment Options: FREE (donations accepted) - bring proof of income or support Best way to get seen: Call and schedule an appointment OR come at 8am on the 1st Monday of every month (except for holidays) when it is first come/first served.     [facility] [phone]   Location: [place], [place] Clinic Hours: Friday mornings Services, Payment Options, Best way to get seen: Call for info

## 2019-05-30 ENCOUNTER — Other Ambulatory Visit: Payer: Self-pay

## 2019-05-30 ENCOUNTER — Ambulatory Visit (INDEPENDENT_AMBULATORY_CARE_PROVIDER_SITE_OTHER): Payer: Medicare HMO | Admitting: Obstetrics and Gynecology

## 2019-05-30 VITALS — BP 108/70 | Ht 66.0 in | Wt 222.0 lb

## 2019-05-30 DIAGNOSIS — N83201 Unspecified ovarian cyst, right side: Secondary | ICD-10-CM

## 2019-05-30 DIAGNOSIS — N83202 Unspecified ovarian cyst, left side: Secondary | ICD-10-CM | POA: Diagnosis not present

## 2019-05-30 NOTE — Progress Notes (Signed)
Obstetrics & Gynecology Office Visit   Chief Complaint:  Chief Complaint  Patient presents with  . Consult    History of Present Illness: 63 y.o. [id number] female with a history of bilateral, simple-appearing ovarian cysts (see prior note).   She is not having any symptoms. She is just concerned that she has cysts on her ovaries and doesn't want to wait until January to revisit the issue.   Past Medical History:  Diagnosis Date  . Acute right-sided low back pain with right-sided sciatica 10/23/2017  . Anemia   . Bilateral renal cysts   . CAD (coronary artery disease) 09/28/2016  . Chest pain 10/14/2015  . Chronic back pain    "upper and lower" (09/19/2014)  . Chronic shoulder pain (Location of Primary Source of Pain) (Left) 01/29/2016  . Colitis 04/08/2016  . Coronary vasospasm ([place]) 12/09/2014  . Depression    "major" (09/19/2014)  . DVT (deep venous thrombosis) ([place])   . Esophageal spasm   . Essential hypertension 10/28/2013  . Gallstones   . Gastritis   . Gastroenteritis   . GERD (gastroesophageal reflux disease)   . Headache    "couple /month lately" (09/19/2014)  . Heart disease 12/09/2016  . High blood pressure   . High cholesterol   . History of blood transfusion 1985   related to hysterectomy  . History of nuclear stress test    Myoview 1/19:  EF 65, no ischemia or scar  . History of pulmonary embolus (PE) 09/19/2014  . 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 Rule out Paget's disease of the bone with blood work.   . Hyperlipidemia 10/28/2013  . Hypertension   . IBS (irritable bowel syndrome)   . Lumbar central spinal stenosis (moderate at L4-5; mild at L2-3 and L3-4) 01/29/2016  . Migraine    "a few times/yr" (09/19/2014)  . Myocardial infarction ([place]) 10/2010 X 3   "while hospitalized"  . Neck pain 01/29/2016  . Osteoarthritis    "qwhere; mostly around my joints" (09/19/2014)  . Pleural effusion, bilateral 07/31/2015  . Pneumonia 04/2014  . PTSD  (post-traumatic stress disorder)   . Pulmonary embolism ([place]) 04/2001; 09/19/2014   "after gallbladder OR; "  . Schizoaffective disorder ([place])   . Schizoaffective disorder, depressive type ([place]) 08/28/2016  . Sleep apnea    "mild" (09/19/2014)  . Snoring    a. sleep study 5/16: No OSA  . SOB (shortness of breath) 07/31/2015  . Spinal stenosis   . Spondylosis   . Suicide attempt ([place]) 08/27/2016  . Type II diabetes mellitus ([place])    "dx'd in 2007; lost weight; no RX for ~ 4 yr now" (09/19/2014)  . Wheezing 02/25/2017    Past Surgical History:  Procedure Laterality Date  . ABDOMINAL HYSTERECTOMY  1985  . ANTERIOR CERVICAL DECOMP/DISCECTOMY FUSION  10/2012   in [place] C5-C7   . APPENDECTOMY  1985  . BACK SURGERY    . BREAST CYST EXCISION Right   . BREAST EXCISIONAL BIOPSY Right YRS AGO   NEG  . CARDIAC CATHETERIZATION   2000's; 2009  . CHOLECYSTECTOMY  2002  . COLONOSCOPY WITH PROPOFOL N/A 12/12/2016   Procedure: COLONOSCOPY WITH PROPOFOL;  Surgeon: [name], MD;  Location[facility] END[facility]COPY;  Service: Endoscopy;  Laterality: N/A;  . COLONOSCOPY WITH PROPOFOL N/A 02/17/2017   Procedure: COLONOSCOPY WITH PROPOFOL;  Surgeon: [name], MD;  Location: [facility] ENDOSCOPY;  Service: Gastroenterology;  Laterality: N/A;  . ESOPHAGOGASTRODUODENOSCOPY (EGD) WITH PROPOFOL N/A 02/17/2017  Procedure: ESOPHAGOGASTRODUODENOSCOPY (EGD) WITH PROPOFOL;  Surgeon: [name], MD;  Location: [facility] ENDOSCOPY;  Service: Gastroenterology;  Laterality: N/A;  . ESOPHAGOGASTRODUODENOSCOPY (EGD) WITH PROPOFOL N/A 03/07/2019   Procedure: ESOPHAGOGASTRODUODENOSCOPY (EGD) WITH PROPOFOL;  Surgeon: [name], MD;  Location: [facility] ENDOSCOPY;  Service: Gastroenterology;  Laterality: N/A;  . EXCISION/RELEASE BURSA HIP Right 12/1984  . HERNIA REPAIR  1985  . LEFT HEART CATHETERIZATION WITH CORONARY ANGIOGRAM N/A 12/09/2014   Procedure: LEFT HEART CATHETERIZATION WITH CORONARY ANGIOGRAM;  Surgeon: Burnell Blanks, MD;   Location: [facility] CATH LAB;  Service: Cardiovascular;  Laterality: N/A;  . TONSILLECTOMY AND ADENOIDECTOMY  ~ 11-11-66  . TOTAL HIP ARTHROPLASTY Left 04-06-2014  . UMBILICAL HERNIA REPAIR  1985  . VENA CAVA FILTER PLACEMENT  03/2014    Gynecologic History: No LMP recorded. Patient has had a hysterectomy.  Obstetric History: [id number]  Family History  Problem Relation Age of Onset  . Stroke Mother        Deceased  . Lung cancer Father        Deceased  . Healthy Daughter   . Healthy Son        x 2  . Other Son        Suicide  . Cancer Brother   . Heart attack Neg Hx     Social History   Socioeconomic History  . Marital status: Divorced    Spouse name: Not on file  . Number of children: Not on file  . Years of education: Not on file  . Highest education level: Not on file  Occupational History  . Not on file  Social Needs  . Financial resource strain: Not hard at all  . Food insecurity    Worry: Never true    Inability: Never true  . Transportation needs    Medical: No    Non-medical: No  Tobacco Use  . Smoking status: Former Smoker    Packs/day: 1.50    Years: 10.00    Pack years: 15.00    Types: Cigarettes    Quit date: 04/12/1979    Years since quitting: 40.1  . Smokeless tobacco: Never Used  Substance and Sexual Activity  . Alcohol use: No    Alcohol/week: 0.0 standard drinks  . Drug use: No  . Sexual activity: Not Currently  Lifestyle  . Physical activity    Days per week: 0 days    Minutes per session: Not on file  . Stress: Not on file  Relationships  . Social Herbalist on phone: Not on file    Gets together: Not on file    Attends religious service: Not on file    Active member of club or organization: Not on file    Attends meetings of clubs or organizations: Not on file    Relationship status: Not on file  . Intimate partner violence    Fear of current or ex partner: No    Emotionally abused: No    Physically abused: No    Forced sexual  activity: No  Other Topics Concern  . Not on file  Social History Narrative   Lives with fiancee in an apartment on the first floor.  Has 3 grown children.  One child passed away.  On disability 11-11-1994 - 2000, 2007 - current.     Education: some college.    Allergies  Allergen Reactions  . Abilify [Aripiprazole] Other (See Comments)    Jerking movement, slow motor skills  . Augmentin [  Amoxicillin-Pot Clavulanate] Diarrhea and Nausea And Vomiting  . Bactrim [Sulfamethoxazole-Trimethoprim] Diarrhea  . Ceftin [Cefuroxime Axetil] Diarrhea  . Coconut Oil     Rash   . Diclofenac Other (See Comments)     Muscle Pain  . Dye Fdc Red [Red Dye]     "hair dye"  . Indocin [Indomethacin] Other (See Comments)    Migraines   . Linaclotide Diarrhea  . Lisinopril Diarrhea and Other (See Comments)    Other reaction(s): Dizziness, Vomiting  . Morphine Other (See Comments)    Chest Tightness  . Morphine And Related Other (See Comments)    Chest tightness   . Simvastatin Other (See Comments)    Muscle Pain  . Sulfa Antibiotics Diarrhea  . Sulfamethoxazole-Trimethoprim Diarrhea  . Wellbutrin [Bupropion] Other (See Comments)    Jerking movement Jerking movement Slurred speech  . Zetia [Ezetimibe]     Diarrhea   . Quetiapine Fumarate Palpitations  . Quetiapine Fumarate Palpitations    Prior to Admission medications   Medication Sig Start Date End Date Taking? Authorizing Provider  acetaminophen (TYLENOL) 500 MG tablet Take 1 tablet (500 mg total) by mouth every 6 (six) hours as needed. 09/29/18   Laban Emperor, PA-C  baclofen (LIORESAL) 10 MG tablet Take 1 tablet (10 mg total) by mouth 3 (three) times daily as needed for muscle spasms. 05/12/19   [name], MD  blood glucose meter kit and supplies KIT Dispense based on patient and insurance preference. Use once daily. Dx code E11.9. 04/01/19   [name], MD  carvedilol (COREG) 3.125 MG tablet Take 1 tablet (3.125 mg total) by  mouth 2 (two) times daily with a meal. 05/11/19   Burnell Blanks, MD  cetirizine (ZYRTEC) 10 MG tablet Take 1 tablet (10 mg total) by mouth daily. 01/25/19   [name], MD  diclofenac sodium (VOLTAREN) 1 % GEL Apply 2 g topically 4 (four) times daily. Right arm/shoulder arthritis 04/08/18   McLean-Scocuzza, Nino Glow, MD  dicyclomine (BENTYL) 20 MG tablet TAKE 1 TABLET BY MOUTH THREE TIMES A DAY AS NEEDED FOR MUSCLE SPASMS 03/24/19   [name], MD  diphenhydrAMINE (BENADRYL) 25 MG tablet Take 1 tablet (25 mg total) by mouth every 6 (six) hours. 09/12/16   Jola Schmidt, MD  ELIQUIS 5 MG TABS tablet TAKE 1 TABLET BY MOUTH TWICE A DAY 04/18/19   [name], MD  empagliflozin (JARDIANCE) 10 MG TABS tablet Take 10 mg by mouth daily. 03/10/19   [name], MD  escitalopram (LEXAPRO) 20 MG tablet Take 1 tablet (20 mg total) by mouth daily. 03/07/19   [name], MD  famotidine (PEPCID) 20 MG tablet TAKE 1 TABLET BY MOUTH EVERYDAY AT BEDTIME 05/12/19   Guse, Jacquelynn Cree, FNP  furosemide (LASIX) 20 MG tablet TAKE 1 TABLET (20 MG TOTAL) BY MOUTH DAILY AS NEEDED FOR EDEMA. 04/22/19   [name], MD  gabapentin (NEURONTIN) 600 MG tablet Take 1-1.5 tablets (600-900 mg total) by mouth 2 (two) times daily. Take 1 tablet in the AM and 1.5 tablet at bedtime 04/26/19   [name], MD  isosorbide mononitrate (IMDUR) 30 MG 24 hr tablet TAKE 1.5 TABLETS (45 MG TOTAL) BY MOUTH DAILY. 12/23/18   Burnell Blanks, MD  JANUVIA 100 MG tablet TAKE 1 TABLET BY MOUTH EVERY DAY 12/23/18   [name], MD  KLOR-CON M20 20 MEQ tablet TAKE 1 TABLET (20 MEQ TOTAL) BY MOUTH DAILY AS  NEEDED (TAKE WITH LASIX). 04/22/19   [name], MD  levothyroxine (SYNTHROID) 25 MCG tablet TAKE 1 TABLET BY MOUTH BEFORE BREAKFAST 2-3 HOURS BEFORE TAKING ANY OTHER MEDICATION OR SUPPLEMENT 03/16/19   [name], MD  lidocaine (LIDODERM) 5 % Place 1 patch onto the skin daily. Remove  & Discard patch within 12 hours. 10/06/18   [name], MD  Multiple Vitamins-Minerals (SUPER THERA VITE M) TABS Take by mouth.    [provider]  NIFEdipine (PROCARDIA) 10 MG capsule TAKE 1 CAPSULE BY MOUTH 2 TIMES DAILY. TAKE 30 MINS BEFORE MEALS UP TO 1X PER DAY 03/28/19   [name], MD  nitroGLYCERIN (NITROSTAT) 0.4 MG SL tablet Place 1 tablet (0.4 mg total) under the tongue every 5 (five) minutes as needed for chest pain. 05/11/19 08/09/19  Burnell Blanks, MD  OLANZapine (ZYPREXA) 10 MG tablet TAKE 1 TABLET BY MOUTH EVERYDAY AT BEDTIME 05/11/19   [name], MD  ondansetron (ZOFRAN) 4 MG tablet TAKE 1 TABLET (4 MG TOTAL) BY MOUTH 2 (TWO) TIMES DAILY AS NEEDED FOR NAUSEA OR VOMITING. 05/12/19   [name], MD  pantoprazole (PROTONIX) 40 MG tablet TAKE 1 TABLET BY MOUTH EVERY DAY 05/03/19   [name], MD  penicillin v potassium (VEETID) 500 MG tablet Take 1 tablet (500 mg total) by mouth 4 (four) times daily. 05/23/19   Jodelle Green, FNP  pravastatin (PRAVACHOL) 10 MG tablet TAKE 1 TABLET BY MOUTH EVERYDAY AT BEDTIME 03/21/19   [name], MD    Review of Systems  Constitutional: Negative.   HENT: Negative.   Eyes: Negative.   Respiratory: Negative.   Cardiovascular: Negative.   Gastrointestinal: Negative.   Genitourinary: Negative.   Musculoskeletal: Negative.   Skin: Negative.   Neurological: Negative.   Psychiatric/Behavioral: Negative.      Physical Exam BP 108/70   Ht '5\' 6"'$  (1.676 m)   Wt 222 lb (100.7 kg)   BMI 35.83 kg/m  No LMP recorded. Patient has had a hysterectomy. Physical Exam Constitutional:      General: She is not in acute distress.    Appearance: Normal appearance.  HENT:     Head: Normocephalic and atraumatic.  Eyes:     General: No scleral icterus.    Conjunctiva/sclera: Conjunctivae normal.  Neurological:     General: No focal deficit present.     Mental Status: She is alert and oriented to  person, place, and time.     Cranial Nerves: No cranial nerve deficit.  Psychiatric:        Mood and Affect: Mood normal.        Behavior: Behavior normal.        Judgment: Judgment normal.     Female chaperone present for pelvic and breast  portions of the physical exam  Assessment: 63 y.o. [id number] female here for  1. Cysts of both ovaries      Plan: Problem List Items Addressed This Visit      Endocrine   Ovarian cyst - Primary   Relevant Orders   Ovarian Malignancy Risk-ROMA (Completed)   US PELVIS TRANSVAGINAL NON-OB (TV ONLY)   Ambulatory referral to Gynecologic Oncology     Given that the patient has relatively stable cysts based on her last imaging, it may be that they are persistent.  Will repeat a pelvic ultrasound to see if they have resolved.  If they have resolved, then no further work-up recommended.  Will  obtain a Roma evaluation by blood work today.  I discussed with her that if we did have to perform surgery to remove the cysts, especially given her comorbidities and extensive surgical history, I would involve gynecologic oncology for support and may be for transfer to their facility to perform the procedure, if deemed necessary.  She voiced agreement with this plan understanding.  20 minutes spent in face to face discussion with > 50% spent in counseling,management, and coordination of care of her bilateral ovarian cysts.   Prentice Docker, MD 05/30/2019 3:35 PM

## 2019-05-30 NOTE — Telephone Encounter (Signed)
I spoke with pt. She has appt with Dr. Angelena Form on 11/6 but would like sooner appointment if possible.  She is agreeable to virtual visit.  I scheduled pt for video visit with Ermalinda Barrios, PA on 06/08/19 at 12:00.  Pt has BP cuff and will check BP/pulse prior to appt.  I told her she could also check daily prior to appt and send those readings to [place] or have available for video visit.

## 2019-05-31 ENCOUNTER — Telehealth: Payer: Self-pay

## 2019-05-31 ENCOUNTER — Encounter: Payer: Self-pay | Admitting: Obstetrics and Gynecology

## 2019-05-31 DIAGNOSIS — E1151 Type 2 diabetes mellitus with diabetic peripheral angiopathy without gangrene: Secondary | ICD-10-CM | POA: Diagnosis not present

## 2019-05-31 DIAGNOSIS — E1142 Type 2 diabetes mellitus with diabetic polyneuropathy: Secondary | ICD-10-CM | POA: Diagnosis not present

## 2019-05-31 DIAGNOSIS — R69 Illness, unspecified: Secondary | ICD-10-CM | POA: Diagnosis not present

## 2019-05-31 DIAGNOSIS — E039 Hypothyroidism, unspecified: Secondary | ICD-10-CM | POA: Diagnosis not present

## 2019-05-31 DIAGNOSIS — J449 Chronic obstructive pulmonary disease, unspecified: Secondary | ICD-10-CM | POA: Diagnosis not present

## 2019-05-31 DIAGNOSIS — E1159 Type 2 diabetes mellitus with other circulatory complications: Secondary | ICD-10-CM | POA: Diagnosis not present

## 2019-05-31 DIAGNOSIS — I509 Heart failure, unspecified: Secondary | ICD-10-CM | POA: Diagnosis not present

## 2019-05-31 DIAGNOSIS — D649 Anemia, unspecified: Secondary | ICD-10-CM | POA: Diagnosis not present

## 2019-05-31 DIAGNOSIS — I25119 Atherosclerotic heart disease of native coronary artery with unspecified angina pectoris: Secondary | ICD-10-CM | POA: Diagnosis not present

## 2019-05-31 LAB — OVARIAN MALIGNANCY RISK-ROMA
Cancer Antigen (CA) 125: 8.1 U/mL (ref 0.0–38.1)
HE4: 73 pmol/L (ref 0.0–96.5)
Postmenopausal ROMA: 1.09
Premenopausal ROMA: 1.6 — ABNORMAL HIGH

## 2019-05-31 LAB — POSTMENOPAUSAL INTERP: LOW

## 2019-05-31 LAB — PREMENOPAUSAL INTERP: HIGH

## 2019-05-31 NOTE — Telephone Encounter (Signed)
Received referral from Dr. Glennon Mac for bilateral ovarian cysts. Chart reviewed.   Previously seen in 01/2019 by Dr. Glennon Mac:  She states that she has had a cyst on her left ovary "for years." She states that she has a right ovarian cyst for an undetermined amount of time.    Small, stable simple-appearing cysts on both ovaries.  She does have some mild pain occasionally.  We discussed that she is a high surgical risk and that the ovarian cysts are likely to be benign.  We discussed weighing the risks of surgery with the benefits of removing the cysts (ovaries).  We also discussed while the pain is possibly coming from the cysts, there is a chance that even with the risks of the surgery and removal of the ovaries that the pain would continue.   We discussed, as an alternative, to monitor her cysts and her pain. If her health is optimized she might be a better candidate for surgery. If her cysts take on any concerning characteristics, we might weight the risks/benefits differently.   Plan is to obtain a pelvic ultrasound in January 2021.  She voiced understanding and agreement with the plan.  09/08/18 US PELVIS  IMPRESSION: Small BILATERAL simple appearing ovarian cysts, both demonstrating simple features by ultrasound and appearing grossly stable since prior CT exam from 09/30/2016.  She returned to Dr. Glennon Mac 05/30/19 as she wants to pursue surgery. Dr. Glennon Mac has referred to [place] as she may not be a good surgical candidate. We will arrange appointment.

## 2019-05-31 NOTE — Telephone Encounter (Signed)
Copied from [place] [id number]. Topic: Referral - Status >> May 31, 2019 [id number] PM Simone Curia D wrote: [id number] Spoke with patient she has an appointment with the dentist next week.  Ambrose Mantle [phone]

## 2019-06-06 ENCOUNTER — Ambulatory Visit: Payer: Medicare HMO | Admitting: Family Medicine

## 2019-06-07 ENCOUNTER — Ambulatory Visit (INDEPENDENT_AMBULATORY_CARE_PROVIDER_SITE_OTHER): Payer: Medicare HMO

## 2019-06-07 ENCOUNTER — Encounter: Payer: Self-pay | Admitting: Obstetrics and Gynecology

## 2019-06-07 ENCOUNTER — Telehealth: Payer: Self-pay | Admitting: Physician Assistant

## 2019-06-07 ENCOUNTER — Ambulatory Visit (INDEPENDENT_AMBULATORY_CARE_PROVIDER_SITE_OTHER): Payer: Medicare HMO | Admitting: Obstetrics and Gynecology

## 2019-06-07 ENCOUNTER — Other Ambulatory Visit: Payer: Self-pay

## 2019-06-07 VITALS — BP 128/84

## 2019-06-07 DIAGNOSIS — N83201 Unspecified ovarian cyst, right side: Secondary | ICD-10-CM | POA: Diagnosis not present

## 2019-06-07 DIAGNOSIS — N83202 Unspecified ovarian cyst, left side: Secondary | ICD-10-CM

## 2019-06-07 NOTE — Telephone Encounter (Signed)
Patient is returning call, she states someone called her about her appt for tomorrow.

## 2019-06-07 NOTE — Progress Notes (Signed)
Gynecology Ultrasound Follow Up   Chief Complaint  Patient presents with  . Follow-up  U/S for bilateral ovarian cysts  History of Present Illness: Patient is a 63 y.o. female who presents today for ultrasound evaluation of the above   Ultrasound demonstrates the following findings Adnexa:   ROV 2.2 x 2.05 x 1.8 cm - simple appearing LOV: 1.9 x 1.8 x 1.9 cm - simple appearing ( U/S: 09/08/2018 - compared to CT on 09/30/2016 ROV 1.9 x 1.9 x 1.8 cm LOV 1.9 x 1.7 x 2.2 ) - no significant change compared to CT 2 years prior  Uterus: absent  Additional: no free fluid  Patient still without pain. She is just concerned they could be malignant and would like them removed. ROMA Score: 1.09 (low risk score, <2.99 is consistent with low risk of finding malignancy)  Past Medical History:  Diagnosis Date  . Acute right-sided low back pain with right-sided sciatica 10/23/2017  . Anemia   . Bilateral renal cysts   . CAD (coronary artery disease) 09/28/2016  . Chest pain 10/14/2015  . Chronic back pain    "upper and lower" (09/19/2014)  . Chronic shoulder pain (Location of Primary Source of Pain) (Left) 01/29/2016  . Colitis 04/08/2016  . Coronary vasospasm ([place]) 12/09/2014  . Depression    "major" (09/19/2014)  . DVT (deep venous thrombosis) ([place])   . Esophageal spasm   . Essential hypertension 10/28/2013  . Gallstones   . Gastritis   . Gastroenteritis   . GERD (gastroesophageal reflux disease)   . Headache    "couple /month lately" (09/19/2014)  . Heart disease 12/09/2016  . High blood pressure   . High cholesterol   . History of blood transfusion 1985   related to hysterectomy  . History of nuclear stress test    Myoview 1/19:  EF 65, no ischemia or scar  . History of pulmonary embolus (PE) 09/19/2014  . 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 Rule out Paget's disease of the bone with blood work.   . Hyperlipidemia 10/28/2013  . Hypertension   . IBS (irritable  bowel syndrome)   . Lumbar central spinal stenosis (moderate at L4-5; mild at L2-3 and L3-4) 01/29/2016  . Migraine    "a few times/yr" (09/19/2014)  . Myocardial infarction ([place]) 10/2010 X 3   "while hospitalized"  . Neck pain 01/29/2016  . Osteoarthritis    "qwhere; mostly around my joints" (09/19/2014)  . Pleural effusion, bilateral 07/31/2015  . Pneumonia 04/2014  . PTSD (post-traumatic stress disorder)   . Pulmonary embolism ([place]) 04/2001; 09/19/2014   "after gallbladder OR; "  . Schizoaffective disorder ([place])   . Schizoaffective disorder, depressive type ([place]) 08/28/2016  . Sleep apnea    "mild" (09/19/2014)  . Snoring    a. sleep study 5/16: No OSA  . SOB (shortness of breath) 07/31/2015  . Spinal stenosis   . Spondylosis   . Suicide attempt ([place]) 08/27/2016  . Type II diabetes mellitus ([place])    "dx'd in 2007; lost weight; no RX for ~ 4 yr now" (09/19/2014)  . Wheezing 02/25/2017    Past Surgical History:  Procedure Laterality Date  . ABDOMINAL HYSTERECTOMY  1985  . ANTERIOR CERVICAL DECOMP/DISCECTOMY FUSION  10/2012   in [place] C5-C7   . APPENDECTOMY  1985  . BACK SURGERY    . BREAST CYST EXCISION Right   . BREAST EXCISIONAL BIOPSY Right YRS AGO   NEG  . CARDIAC CATHETERIZATION  2000's; 2009  . CHOLECYSTECTOMY  2002  . COLONOSCOPY WITH PROPOFOL N/A 12/12/2016   Procedure: COLONOSCOPY WITH PROPOFOL;  Surgeon: Kiran Anna, MD;  Location: [facility] ENDOSCOPY;  Service: Endoscopy;  Laterality: N/A;  . COLONOSCOPY WITH PROPOFOL N/A 02/17/2017   Procedure: COLONOSCOPY WITH PROPOFOL;  Surgeon: Anna, Kiran, MD;  Location: [facility] ENDOSCOPY;  Service: Gastroenterology;  Laterality: N/A;  . ESOPHAGOGASTRODUODENOSCOPY (EGD) WITH PROPOFOL N/A 02/17/2017   Procedure: ESOPHAGOGASTRODUODENOSCOPY (EGD) WITH PROPOFOL;  Surgeon: Anna, Kiran, MD;  Location: [facility] ENDOSCOPY;  Service: Gastroenterology;  Laterality: N/A;  . ESOPHAGOGASTRODUODENOSCOPY (EGD) WITH PROPOFOL N/A 03/07/2019   Procedure:  ESOPHAGOGASTRODUODENOSCOPY (EGD) WITH PROPOFOL;  Surgeon: Anna, Kiran, MD;  Location: [facility] ENDOSCOPY;  Service: Gastroenterology;  Laterality: N/A;  . EXCISION/RELEASE BURSA HIP Right 12/1984  . HERNIA REPAIR  1985  . LEFT HEART CATHETERIZATION WITH CORONARY ANGIOGRAM N/A 12/09/2014   Procedure: LEFT HEART CATHETERIZATION WITH CORONARY ANGIOGRAM;  Surgeon: Christopher D McAlhany, MD;  Location: [facility];  Service: Cardiovascular;  Laterality: N/A;  . TONSILLECTOMY AND ADENOIDECTOMY  ~ 1968  . TOTAL HIP ARTHROPLASTY Left 04-06-2014  . UMBILICAL HERNIA REPAIR  1985  . VENA CAVA FILTER PLACEMENT  03/2014    Family History  Problem Relation Age of Onset  . Stroke Mother        Deceased  . Lung cancer Father        Deceased  . Healthy Daughter   . Healthy Son        x 2  . Other Son        Suicide  . Cancer Brother   . Heart attack Neg Hx     Social History   Socioeconomic History  . Marital status: Divorced    Spouse name: Not on file  . Number of children: Not on file  . Years of education: Not on file  . Highest education level: Not on file  Occupational History  . Not on file  Social Needs  . Financial resource strain: Not hard at all  . Food insecurity    Worry: Never true    Inability: Never true  . Transportation needs    Medical: No    Non-medical: No  Tobacco Use  . Smoking status: Former Smoker    Packs/day: 1.50    Years: 10.00    Pack years: 15.00    Types: Cigarettes    Quit date: 04/12/1979    Years since quitting: 40.1  . Smokeless tobacco: Never Used  Substance and Sexual Activity  . Alcohol use: No    Alcohol/week: 0.0 standard drinks  . Drug use: No  . Sexual activity: Not Currently  Lifestyle  . Physical activity    Days per week: 0 days    Minutes per session: Not on file  . Stress: Not on file  Relationships  . Social connections    Talks on phone: Not on file    Gets together: Not on file    Attends religious service: Not on file     Active member of club or organization: Not on file    Attends meetings of clubs or organizations: Not on file    Relationship status: Not on file  . Intimate partner violence    Fear of current or ex partner: No    Emotionally abused: No    Physically abused: No    Forced sexual activity: No  Other Topics Concern  . Not on file  Social History Narrative   Lives with fiancee   in an apartment on the first floor.  Has 3 grown children.  One child passed away.  On disability 10-20-1994 - 2000, 2007 - current.     Education: some college.    Allergies  Allergen Reactions  . Abilify [Aripiprazole] Other (See Comments)    Jerking movement, slow motor skills  . Augmentin [Amoxicillin-Pot Clavulanate] Diarrhea and Nausea And Vomiting  . Bactrim [Sulfamethoxazole-Trimethoprim] Diarrhea  . Ceftin [Cefuroxime Axetil] Diarrhea  . Coconut Oil     Rash   . Diclofenac Other (See Comments)     Muscle Pain  . Dye Fdc Red [Red Dye]     "hair dye"  . Indocin [Indomethacin] Other (See Comments)    Migraines   . Linaclotide Diarrhea  . Lisinopril Diarrhea and Other (See Comments)    Other reaction(s): Dizziness, Vomiting  . Morphine Other (See Comments)    Chest Tightness  . Morphine And Related Other (See Comments)    Chest tightness   . Simvastatin Other (See Comments)    Muscle Pain  . Sulfa Antibiotics Diarrhea  . Sulfamethoxazole-Trimethoprim Diarrhea  . Wellbutrin [Bupropion] Other (See Comments)    Jerking movement Jerking movement Slurred speech  . Zetia [Ezetimibe]     Diarrhea   . Quetiapine Fumarate Palpitations  . Quetiapine Fumarate Palpitations    Prior to Admission medications   Medication Sig Start Date End Date Taking? Authorizing Provider  acetaminophen (TYLENOL) 500 MG tablet Take 1 tablet (500 mg total) by mouth every 6 (six) hours as needed. 09/29/18   Laban Emperor, PA-C  baclofen (LIORESAL) 10 MG tablet Take 1 tablet (10 mg total) by mouth 3 (three) times daily as  needed for muscle spasms. 05/12/19   [name], MD  blood glucose meter kit and supplies KIT Dispense based on patient and insurance preference. Use once daily. Dx code E11.9. 04/01/19   [name], MD  carvedilol (COREG) 3.125 MG tablet Take 1 tablet (3.125 mg total) by mouth 2 (two) times daily with a meal. 05/11/19   Burnell Blanks, MD  cetirizine (ZYRTEC) 10 MG tablet Take 1 tablet (10 mg total) by mouth daily. 01/25/19   [name], MD  diclofenac sodium (VOLTAREN) 1 % GEL Apply 2 g topically 4 (four) times daily. Right arm/shoulder arthritis 04/08/18   McLean-Scocuzza, Nino Glow, MD  dicyclomine (BENTYL) 20 MG tablet TAKE 1 TABLET BY MOUTH THREE TIMES A DAY AS NEEDED FOR MUSCLE SPASMS 03/24/19   [name], MD  diphenhydrAMINE (BENADRYL) 25 MG tablet Take 1 tablet (25 mg total) by mouth every 6 (six) hours. 09/12/16   Jola Schmidt, MD  ELIQUIS 5 MG TABS tablet TAKE 1 TABLET BY MOUTH TWICE A DAY 04/18/19   [name], MD  empagliflozin (JARDIANCE) 10 MG TABS tablet Take 10 mg by mouth daily. 03/10/19   [name], MD  escitalopram (LEXAPRO) 20 MG tablet Take 1 tablet (20 mg total) by mouth daily. 03/07/19   [name], MD  famotidine (PEPCID) 20 MG tablet TAKE 1 TABLET BY MOUTH EVERYDAY AT BEDTIME 05/12/19   Guse, Jacquelynn Cree, FNP  furosemide (LASIX) 20 MG tablet TAKE 1 TABLET (20 MG TOTAL) BY MOUTH DAILY AS NEEDED FOR EDEMA. 04/22/19   [name], MD  gabapentin (NEURONTIN) 600 MG tablet Take 1-1.5 tablets (600-900 mg total) by mouth 2 (two) times daily. Take 1 tablet in the AM and 1.5 tablet at bedtime 04/26/19   [name], MD  isosorbide  mononitrate (IMDUR) 30 MG 24 hr tablet TAKE 1.5 TABLETS (45 MG TOTAL) BY MOUTH DAILY. 12/23/18   McAlhany, Christopher D, MD  JANUVIA 100 MG tablet TAKE 1 TABLET BY MOUTH EVERY DAY 12/23/18   Sonnenberg, Eric G, MD  KLOR-CON M20 20 MEQ tablet TAKE 1 TABLET (20 MEQ TOTAL) BY MOUTH DAILY AS NEEDED (TAKE  WITH LASIX). 04/22/19   Sonnenberg, Eric G, MD  levothyroxine (SYNTHROID) 25 MCG tablet TAKE 1 TABLET BY MOUTH BEFORE BREAKFAST 2-3 HOURS BEFORE TAKING ANY OTHER MEDICATION OR SUPPLEMENT 03/16/19   Sonnenberg, Eric G, MD  lidocaine (LIDODERM) 5 % Place 1 patch onto the skin daily. Remove & Discard patch within 12 hours. 10/06/18   Sonnenberg, Eric G, MD  Multiple Vitamins-Minerals (SUPER THERA VITE M) TABS Take by mouth.    [provider]  NIFEdipine (PROCARDIA) 10 MG capsule TAKE 1 CAPSULE BY MOUTH 2 TIMES DAILY. TAKE 30 MINS BEFORE MEALS UP TO 1X PER DAY 03/28/19   Sonnenberg, Eric G, MD  nitroGLYCERIN (NITROSTAT) 0.4 MG SL tablet Place 1 tablet (0.4 mg total) under the tongue every 5 (five) minutes as needed for chest pain. 05/11/19 08/09/19  McAlhany, Christopher D, MD  OLANZapine (ZYPREXA) 10 MG tablet TAKE 1 TABLET BY MOUTH EVERYDAY AT BEDTIME 05/11/19   Sonnenberg, Eric G, MD  ondansetron (ZOFRAN) 4 MG tablet TAKE 1 TABLET (4 MG TOTAL) BY MOUTH 2 (TWO) TIMES DAILY AS NEEDED FOR NAUSEA OR VOMITING. 05/12/19   Sonnenberg, Eric G, MD  pantoprazole (PROTONIX) 40 MG tablet TAKE 1 TABLET BY MOUTH EVERY DAY 05/03/19   Sonnenberg, Eric G, MD  penicillin v potassium (VEETID) 500 MG tablet Take 1 tablet (500 mg total) by mouth 4 (four) times daily. 05/23/19   Guse, Lauren M, FNP  pravastatin (PRAVACHOL) 10 MG tablet TAKE 1 TABLET BY MOUTH EVERYDAY AT BEDTIME 03/21/19   Sonnenberg, Eric G, MD    Physical Exam BP 128/84    General: NAD HEENT: normocephalic, anicteric Pulmonary: No increased work of breathing Extremities: no edema, erythema, or tenderness Neurologic: Grossly intact, normal gait Psychiatric: mood appropriate, affect full  Imaging Results Us Pelvis Transvaginal Non-ob (tv Only)  Result Date: 06/07/2019 Patient Name: Valerie Vazquez DOB: 01/05/1956 MRN: [id number] ULTRASOUND REPORT Location: [facility] Date of Service: 06/07/2019 Indications: ovarian cysts Findings: Complete  hysterectomy Right Ovary measures 4.6 x 2.0 x 2.6 cm. It is not normal in appearance. There is a simple cyst in the right ovary measuring 22.3 x 20.5 x 17.8 mm Left Ovary measures 3.7 x 2.1 x 1.9 cm. It is not normal in appearance. There is a simple cyst in the left ovary measuring 19.4 x 17.7 x 19.1 mm Survey of the adnexa demonstrates no adnexal masses. There is no free fluid in the cul de sac. Impression: 1. There is one simple cyst in each ovary. 2. Complete hysterectomy. Elyse S Fairbanks, RT The ultrasound images and findings were reviewed by me and I agree with the above report.  , MD, FACOG [facility], [facility] 06/07/2019 12:17 PM       Assessment: 63 y.o. [id number]  1. Cysts of both ovaries      Plan: Problem List Items Addressed This Visit      Endocrine   Ovarian cyst - Primary     Patient has an appointment with GYN oncology next week.  We discussed the findings that are consistent with stable cysts on both ovaries.  While I cannot guarantee that these   are benign findings, the fact that they have not changed in nearly 3 years is reassuring.  Also, her Roma score was low risk.  Still, she greatly desires to have them removed.  She would be a high risk surgical patient and if she is to have surgery, I would prefer to have GYN oncology as backup.  She agrees to see them in consultation for their recommendations moving forward.  15 minutes spent in face to face discussion with > 50% spent in counseling,management, and coordination of care of her bilateral ovarian cysts.   Prentice Docker, MD, [facility], [facility] 06/07/2019 12:49 PM

## 2019-06-08 ENCOUNTER — Telehealth (INDEPENDENT_AMBULATORY_CARE_PROVIDER_SITE_OTHER): Payer: Medicare HMO | Admitting: Physician Assistant

## 2019-06-08 ENCOUNTER — Encounter: Payer: Self-pay | Admitting: Physician Assistant

## 2019-06-08 VITALS — BP 125/90 | HR 73 | Temp 98.0°F | Ht 66.0 in | Wt 223.8 lb

## 2019-06-08 DIAGNOSIS — I201 Angina pectoris with documented spasm: Secondary | ICD-10-CM | POA: Diagnosis not present

## 2019-06-08 DIAGNOSIS — Z86711 Personal history of pulmonary embolism: Secondary | ICD-10-CM

## 2019-06-08 DIAGNOSIS — E785 Hyperlipidemia, unspecified: Secondary | ICD-10-CM | POA: Diagnosis not present

## 2019-06-08 DIAGNOSIS — I10 Essential (primary) hypertension: Secondary | ICD-10-CM | POA: Diagnosis not present

## 2019-06-08 DIAGNOSIS — I5032 Chronic diastolic (congestive) heart failure: Secondary | ICD-10-CM | POA: Diagnosis not present

## 2019-06-08 NOTE — Patient Instructions (Addendum)
Medication Instructions:  Your physician recommends that you continue on your current medications as directed. Please refer to the Current Medication list given to you today.  If you need a refill on your cardiac medications before your next appointment, please call your pharmacy.   Lab work: Please have your Primary Care Doctor draw labs: Fasting LIPIDS, LFTS, CMET and TSH. Please have them fax the results to [place]  If you have labs (blood work) drawn today and your tests are completely normal, you will receive your results only by: Marland Kitchen MyChart Message (if you have MyChart) OR . A paper copy in the mail If you have any lab test that is abnormal or we need to change your treatment, we will call you to review the results.  Testing/Procedures: None ordered  Follow-Up: At [facility], you and your health needs are our priority.  As part of our continuing mission to provide you with exceptional heart care, we have created designated Provider Care Teams.  These Care Teams include your primary Cardiologist (physician) and Advanced Practice Providers (APPs -  Physician Assistants and Nurse Practitioners) who all work together to provide you with the care you need, when you need it. . You will need a follow up appointment in 1 year.  Please call our office 2 months in advance to schedule this appointment.  You may see Darlina Guys, MD or one of the following Advanced Practice Providers on your designated Care Team:   . Lyda Jester, PA-C . Dayna Dunn, PA-C . Ermalinda Barrios, PA-C  Any Other Special Instructions Will Be Listed Below (If Applicable).  Your physician has requested that you regularly monitor and record your blood pressure readings at home. Please use the same machine at the same time of day to check your readings and record them. Please call to report your readings in 2 weeks.

## 2019-06-08 NOTE — Progress Notes (Signed)
Virtual Visit via Video Note   This visit type was conducted due to national recommendations for restrictions regarding the COVID-19 Pandemic (e.g. social distancing) in an effort to limit this patient's exposure and mitigate transmission in our community.  Due to her co-morbid illnesses, this patient is at least at moderate risk for complications without adequate follow up.  This format is felt to be most appropriate for this patient at this time.  All issues noted in this document were discussed and addressed.  A limited physical exam was performed with this format.  Please refer to the patient's chart for her consent to telehealth for [facility].   Date:  06/08/2019   ID:  Valerie Vazquez, DOB [birth year], MRN [id number]  Patient Location: Other:  [place] Provider Location: Home  PCP:  [name], MD  Cardiologist:  Lauree Chandler, MD   Electrophysiologist:  None   Evaluation Performed:  Follow-Up Visit  Chief Complaint:  F/U  History of Present Illness:    Valerie Vazquez is a 63 y.o. female with  with history of schizoaffective disorder, DM, HTN, diastolic CHF and multiple episodes of PE/DVT now on Eliquis with IVC filter  She was admitted to [facility] in April 2016 with chest pain. Her initial EKG was normal but she had dynamic ST changes when chest pain recurred so a code STEMI was called. Emergent cardiac cath with no evidence of CAD. Troponin was negative. She was started on Diltiazem and Imdur for possible vasospasm.  Admitted to [facility] in January 2018 with chest pain. Nuclear stress test January 2018 with no ischemia. Chest pain twice 08/2017 negative troponins, CXR bilateral pleural effusions, normal NST 09/2017.  LOV Dr. Angelena Form 03/2018 no complaints, given NTG prn.Echo 03/2019 for shortness of breath LVEF 60-65%, impaired diastolic function.   Patient called in 05/27/19 with low BP on nifedipine and was advised to stop it and keep track of BP's. BP still running  slightly low 91/50-110/70. Dizziness has improved since stopping nifedipine. Occasional chest pain that she thinks is esophageal spasm. Occurs after she eats at times. Doesn't use NTG and GI meds eases chest pain. Doesn't exercise but signed up for silver sneakers. May need ovaries removed for cysts.  The patient does not have symptoms concerning for COVID-19 infection (fever, chills, cough, or new shortness of breath).    Past Medical History:  Diagnosis Date  . Acute right-sided low back pain with right-sided sciatica 10/23/2017  . Anemia   . Bilateral renal cysts   . CAD (coronary artery disease) 09/28/2016  . Chest pain 10/14/2015  . Chronic back pain    "upper and lower" (09/19/2014)  . Chronic shoulder pain (Location of Primary Source of Pain) (Left) 01/29/2016  . Colitis 04/08/2016  . Coronary vasospasm ([place]) 12/09/2014  . Depression    "major" (09/19/2014)  . DVT (deep venous thrombosis) ([place])   . Esophageal spasm   . Essential hypertension 10/28/2013  . Gallstones   . Gastritis   . Gastroenteritis   . GERD (gastroesophageal reflux disease)   . Headache    "couple /month lately" (09/19/2014)  . Heart disease 12/09/2016  . High blood pressure   . High cholesterol   . History of blood transfusion 1985   related to hysterectomy  . History of nuclear stress test    Myoview 1/19:  EF 65, no ischemia or scar  . History of pulmonary embolus (PE) 09/19/2014  . 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  Rule out Paget's disease of the bone with blood work.   . Hyperlipidemia 10/28/2013  . Hypertension   . IBS (irritable bowel syndrome)   . Lumbar central spinal stenosis (moderate at L4-5; mild at L2-3 and L3-4) 01/29/2016  . Migraine    "a few times/yr" (09/19/2014)  . Myocardial infarction ([place]) 10/2010 X 3   "while hospitalized"  . Neck pain 01/29/2016  . Osteoarthritis    "qwhere; mostly around my joints" (09/19/2014)  . Pleural effusion, bilateral 07/31/2015  .  Pneumonia 04/2014  . PTSD (post-traumatic stress disorder)   . Pulmonary embolism ([place]) 04/2001; 09/19/2014   "after gallbladder OR; "  . Schizoaffective disorder ([place])   . Schizoaffective disorder, depressive type ([place]) 08/28/2016  . Sleep apnea    "mild" (09/19/2014)  . Snoring    a. sleep study 5/16: No OSA  . SOB (shortness of breath) 07/31/2015  . Spinal stenosis   . Spondylosis   . Suicide attempt ([place]) 08/27/2016  . Type II diabetes mellitus ([place])    "dx'd in 2007; lost weight; no RX for ~ 4 yr now" (09/19/2014)  . Wheezing 02/25/2017   Past Surgical History:  Procedure Laterality Date  . ABDOMINAL HYSTERECTOMY  1985  . ANTERIOR CERVICAL DECOMP/DISCECTOMY FUSION  10/2012   in [place] C5-C7   . APPENDECTOMY  1985  . BACK SURGERY    . BREAST CYST EXCISION Right   . BREAST EXCISIONAL BIOPSY Right YRS AGO   NEG  . CARDIAC CATHETERIZATION   2000's; 2009  . CHOLECYSTECTOMY  2002  . COLONOSCOPY WITH PROPOFOL N/A 12/12/2016   Procedure: COLONOSCOPY WITH PROPOFOL;  Surgeon: [name], MD;  Location: [facility] ENDOSCOPY;  Service: Endoscopy;  Laterality: N/A;  . COLONOSCOPY WITH PROPOFOL N/A 02/17/2017   Procedure: COLONOSCOPY WITH PROPOFOL;  Surgeon: [name], MD;  Location[facility] ENDOSCOPY;  Service: Gastroenterology;  Laterality: N/A;  . ESOPHAGOGASTRODUODENOSCOPY (EGD) WITH PROPOFOL N/A 02/17/2017   Procedure: ESOPHAGOGASTRODUODENOSCOPY (EGD) WITH PROPOFOL;  Surgeon: [name], MD;  Location: [facility] ENDOSCOPY;  Service: Gastroenterology;  Laterality: N/A;  . ESOPHAGOGASTRODUODENOSCOPY (EGD) WITH PROPOFOL N/A 03/07/2019   Procedure: ESOPHAGOGASTRODUODENOSCOPY (EGD) WITH PROPOFOL;  Surgeon: [name], MD;  Location: [facility] ENDOSCOPY;  Service: Gastroenterology;  Laterality: N/A;  . EXCISION/RELEASE BURSA HIP Right 12/1984  . HERNIA REPAIR  1985  . LEFT HEART CATHETERIZATION WITH CORONARY ANGIOGRAM N/A 12/09/2014   Procedure: LEFT HEART CATHETERIZATION WITH CORONARY ANGIOGRAM;  Surgeon:  Burnell Blanks, MD;  Location: [facility] CATH LAB;  Service: Cardiovascular;  Laterality: N/A;  . TONSILLECTOMY AND ADENOIDECTOMY  ~ 1968  . TOTAL HIP ARTHROPLASTY Left 04-06-2014  . UMBILICAL HERNIA REPAIR  1985  . VENA CAVA FILTER PLACEMENT  03/2014     Current Meds  Medication Sig  . acetaminophen (TYLENOL) 500 MG tablet Take 1 tablet (500 mg total) by mouth every 6 (six) hours as needed.  . baclofen (LIORESAL) 10 MG tablet Take 1 tablet (10 mg total) by mouth 3 (three) times daily as needed for muscle spasms.  . blood glucose meter kit and supplies KIT Dispense based on patient and insurance preference. Use once daily. Dx code E11.9.  Marland Kitchen carvedilol (COREG) 3.125 MG tablet Take 1 tablet (3.125 mg total) by mouth 2 (two) times daily with a meal.  . cetirizine (ZYRTEC) 10 MG tablet Take 1 tablet (10 mg total) by mouth daily.  . diclofenac sodium (VOLTAREN) 1 % GEL Apply 2 g topically 4 (four) times daily. Right arm/shoulder arthritis  . dicyclomine (BENTYL) 20  MG tablet TAKE 1 TABLET BY MOUTH THREE TIMES A DAY AS NEEDED FOR MUSCLE SPASMS  . diphenhydrAMINE (BENADRYL) 25 MG tablet Take 1 tablet (25 mg total) by mouth every 6 (six) hours.  Marland Kitchen ELIQUIS 5 MG TABS tablet TAKE 1 TABLET BY MOUTH TWICE A DAY  . empagliflozin (JARDIANCE) 10 MG TABS tablet Take 10 mg by mouth daily.  Marland Kitchen escitalopram (LEXAPRO) 20 MG tablet Take 1 tablet (20 mg total) by mouth daily.  . famotidine (PEPCID) 20 MG tablet TAKE 1 TABLET BY MOUTH EVERYDAY AT BEDTIME  . furosemide (LASIX) 20 MG tablet TAKE 1 TABLET (20 MG TOTAL) BY MOUTH DAILY AS NEEDED FOR EDEMA.  Marland Kitchen gabapentin (NEURONTIN) 600 MG tablet Take 1-1.5 tablets (600-900 mg total) by mouth 2 (two) times daily. Take 1 tablet in the AM and 1.5 tablet at bedtime  . isosorbide mononitrate (IMDUR) 30 MG 24 hr tablet TAKE 1.5 TABLETS (45 MG TOTAL) BY MOUTH DAILY.  Marland Kitchen JANUVIA 100 MG tablet TAKE 1 TABLET BY MOUTH EVERY DAY  . KLOR-CON M20 20 MEQ tablet TAKE 1 TABLET (20 MEQ  TOTAL) BY MOUTH DAILY AS NEEDED (TAKE WITH LASIX).  Marland Kitchen levothyroxine (SYNTHROID) 25 MCG tablet TAKE 1 TABLET BY MOUTH BEFORE BREAKFAST 2-3 HOURS BEFORE TAKING ANY OTHER MEDICATION OR SUPPLEMENT  . lidocaine (LIDODERM) 5 % Place 1 patch onto the skin daily. Remove & Discard patch within 12 hours.  . Multiple Vitamins-Minerals (SUPER THERA VITE M) TABS Take by mouth.  Marland Kitchen NIFEdipine (PROCARDIA) 10 MG capsule TAKE 1 CAPSULE BY MOUTH 2 TIMES DAILY. TAKE 30 MINS BEFORE MEALS UP TO 1X PER DAY  . nitroGLYCERIN (NITROSTAT) 0.4 MG SL tablet Place 1 tablet (0.4 mg total) under the tongue every 5 (five) minutes as needed for chest pain.  Marland Kitchen OLANZapine (ZYPREXA) 10 MG tablet TAKE 1 TABLET BY MOUTH EVERYDAY AT BEDTIME  . ondansetron (ZOFRAN) 4 MG tablet TAKE 1 TABLET (4 MG TOTAL) BY MOUTH 2 (TWO) TIMES DAILY AS NEEDED FOR NAUSEA OR VOMITING.  . pantoprazole (PROTONIX) 40 MG tablet TAKE 1 TABLET BY MOUTH EVERY DAY  . penicillin v potassium (VEETID) 500 MG tablet Take 1 tablet (500 mg total) by mouth 4 (four) times daily.  . pravastatin (PRAVACHOL) 10 MG tablet TAKE 1 TABLET BY MOUTH EVERYDAY AT BEDTIME     Allergies:   Abilify [aripiprazole], Augmentin [amoxicillin-pot clavulanate], Bactrim [sulfamethoxazole-trimethoprim], Ceftin [cefuroxime axetil], Coconut oil, Diclofenac, Dye fdc red [red dye], Indocin [indomethacin], Linaclotide, Lisinopril, Morphine, Morphine and related, Simvastatin, Sulfa antibiotics, Sulfamethoxazole-trimethoprim, Wellbutrin [bupropion], Zetia [ezetimibe], Quetiapine fumarate, and Quetiapine fumarate   Social History   Tobacco Use  . Smoking status: Former Smoker    Packs/day: 1.50    Years: 10.00    Pack years: 15.00    Types: Cigarettes    Quit date: 04/12/1979    Years since quitting: 40.1  . Smokeless tobacco: Never Used  Substance Use Topics  . Alcohol use: No    Alcohol/week: 0.0 standard drinks  . Drug use: No     Family Hx: The patient's family history includes Cancer  in her brother; Healthy in her daughter and son; Lung cancer in her father; Other in her son; Stroke in her mother. There is no history of Heart attack.  ROS:   Please see the history of present illness.      All other systems reviewed and are negative.   Prior CV studies:   The following studies were reviewed today:  Echo 03/2019 IMPRESSIONS  1. The left ventricle has normal systolic function with an ejection fraction of 60-65%. The cavity size was normal. There is mildly increased left ventricular wall thickness. Left ventricular diastolic Doppler parameters are consistent with impaired  relaxation.  2. The right ventricle has normal systolic function. The cavity was normal. There is no increase in right ventricular wall thickness. Right ventricular systolic pressure is normal (15-20 mmHg plus central venous pressure).  3. Right atrial size was mildly dilated.  4. The aortic valve is grossly normal.  5. The aortic root is normal in size and structure.  6. The interatrial septum was not well visualized.   FINDINGS  Left Ventricle: The left ventricle has normal systolic function, with an ejection fraction of 60-65%. The cavity size was normal. There is mildly increased left ventricular wall thickness. Left ventricular diastolic Doppler parameters are consistent  with impaired relaxation.   Right Ventricle: The right ventricle has normal systolic function. The cavity was normal. There is no increase in right ventricular wall thickness. Right ventricular systolic pressure is normal (15-20 mmHg plus central venous pressure).   Left Atrium: Left atrial size was normal in size.   Right Atrium: Right atrial size was mildly dilated.   Interatrial Septum: The interatrial septum was not well visualized.   Pericardium: There is no evidence of pericardial effusion. There is a pericardial fat pad noted.   Mitral Valve: The mitral valve is normal in structure. Mitral valve regurgitation is  not visualized by color flow Doppler.   Tricuspid Valve: The tricuspid valve is normal in structure. Tricuspid valve regurgitation is mild by color flow Doppler.   Aortic Valve: The aortic valve is grossly normal Aortic valve regurgitation was not visualized by color flow Doppler. There is no evidence of aortic valve stenosis.   Pulmonic Valve: The pulmonic valve was not well visualized. Pulmonic valve regurgitation was not assessed by color flow Doppler.   Aorta: The aortic root is normal in size and structure.   Venous: The inferior vena cava was not well visualized.   NST 09/2017 Study Highlights     Nuclear stress EF: 65%.  The study is normal.  This is a low risk study.  The left ventricular ejection fraction is normal (55-65%).   Normal pharmacologic nuclear stress test with no evidence for prior infarct or ischemia.        Labs/Other Tests and Data Reviewed:    EKG:  No ECG reviewed.  Recent Labs: 02/09/2019: ALT 14 02/16/2019: TSH 2.470 05/19/2019: BUN 10; Creatinine, Ser 0.83; Hemoglobin 11.6; Platelets 325; Potassium 3.7; Sodium 140   Recent Lipid Panel Lab Results  Component Value Date/Time   CHOL 304 (H) 08/21/2017 11:18 AM   TRIG 190.0 (H) 08/21/2017 11:18 AM   HDL 38.70 (L) 08/21/2017 11:18 AM   CHOLHDL 8 08/21/2017 11:18 AM   LDLCALC 227 (H) 08/21/2017 11:18 AM    Wt Readings from Last 3 Encounters:  06/08/19 223 lb 12.8 oz (101.5 kg)  05/30/19 222 lb (100.7 kg)  05/24/19 224 lb (101.6 kg)     Objective:    Vital Signs:  BP 125/90   Pulse 73   Temp 98 F (36.7 C)   Ht _0  (1.676 m)   Wt 223 lb 12.8 oz (101.5 kg)   BMI 36.12 kg/m    VITAL SIGNS:  reviewed GEN:  no acute distress RESPIRATORY:  normal respiratory effort, symmetric expansion CARDIOVASCULAR:  no peripheral edema  ASSESSMENT & PLAN:    1. Coronary  vasospasm 2016, normal coronary arteries at cath. BP dropping on nifedipine. Continues on Imdur. NST 09/2017 normal 2.  Essential HTN-BP has been running low but doing better off nifedipine. Dizziness improved. Will keep track of  BP and call us with update in 2 weeks. 3. HLD on low dose pravastatin. Needs flp and lft's-will order to be done at PCP 4. History of PE on Eliquis/IVC filter 5. Chronic diastolic CHF-compensated-not taking lasix  COVID-19 Education: The signs and symptoms of COVID-19 were discussed with the patient and how to seek care for testing (follow up with PCP or arrange E-visit).   The importance of social distancing was discussed today.  Time:   Today, I have spent 17:40 minutes with the patient with telehealth technology discussing the above problems.     Medication Adjustments/Labs and Tests Ordered: Current medicines are reviewed at length with the patient today.  Concerns regarding medicines are outlined above.   Tests Ordered: No orders of the defined types were placed in this encounter.   Medication Changes: No orders of the defined types were placed in this encounter.   Follow Up:  Either In Person or Virtual Visit in 1 year(s) Dr. Angelena Form  Signed, Ermalinda Barrios, PA-C  06/08/2019 12:03 PM    [place]

## 2019-06-09 ENCOUNTER — Telehealth: Payer: Self-pay

## 2019-06-09 DIAGNOSIS — I509 Heart failure, unspecified: Secondary | ICD-10-CM

## 2019-06-09 DIAGNOSIS — I10 Essential (primary) hypertension: Secondary | ICD-10-CM

## 2019-06-09 DIAGNOSIS — E785 Hyperlipidemia, unspecified: Secondary | ICD-10-CM

## 2019-06-12 NOTE — Telephone Encounter (Signed)
Ordered. Please get her scheduled for fasting labs. Thanks.

## 2019-06-13 ENCOUNTER — Telehealth: Payer: Self-pay

## 2019-06-13 NOTE — Telephone Encounter (Signed)
lmtcb  Ok for pec to schedule fasting labs with the patient.  Nina,cma

## 2019-06-13 NOTE — Telephone Encounter (Signed)
Called to speak with Valerie Vazquez regarding her upcoming appointment 10/14. Gyn onc provider will be in the OR during this time. I have offered to move her appointment to 10/21. She would like to come 10/14 to see if she can be seen between OR cases. I have asked her to come at 1015.

## 2019-06-15 ENCOUNTER — Other Ambulatory Visit (INDEPENDENT_AMBULATORY_CARE_PROVIDER_SITE_OTHER): Payer: Medicare HMO

## 2019-06-15 ENCOUNTER — Inpatient Hospital Stay: Payer: Medicare HMO

## 2019-06-15 ENCOUNTER — Other Ambulatory Visit: Payer: Self-pay

## 2019-06-15 ENCOUNTER — Inpatient Hospital Stay: Payer: Medicare HMO | Attending: Obstetrics and Gynecology | Admitting: Obstetrics and Gynecology

## 2019-06-15 VITALS — BP 97/70 | HR 86 | Temp 98.2°F | Resp 20 | Ht 66.0 in | Wt 223.7 lb

## 2019-06-15 DIAGNOSIS — M1610 Unilateral primary osteoarthritis, unspecified hip: Secondary | ICD-10-CM | POA: Insufficient documentation

## 2019-06-15 DIAGNOSIS — I951 Orthostatic hypotension: Secondary | ICD-10-CM | POA: Insufficient documentation

## 2019-06-15 DIAGNOSIS — Z79899 Other long term (current) drug therapy: Secondary | ICD-10-CM | POA: Insufficient documentation

## 2019-06-15 DIAGNOSIS — E119 Type 2 diabetes mellitus without complications: Secondary | ICD-10-CM | POA: Diagnosis not present

## 2019-06-15 DIAGNOSIS — N83201 Unspecified ovarian cyst, right side: Secondary | ICD-10-CM | POA: Diagnosis not present

## 2019-06-15 DIAGNOSIS — Z86718 Personal history of other venous thrombosis and embolism: Secondary | ICD-10-CM | POA: Diagnosis not present

## 2019-06-15 DIAGNOSIS — R0602 Shortness of breath: Secondary | ICD-10-CM | POA: Diagnosis not present

## 2019-06-15 DIAGNOSIS — Z915 Personal history of self-harm: Secondary | ICD-10-CM | POA: Diagnosis not present

## 2019-06-15 DIAGNOSIS — Z86711 Personal history of pulmonary embolism: Secondary | ICD-10-CM | POA: Diagnosis not present

## 2019-06-15 DIAGNOSIS — I10 Essential (primary) hypertension: Secondary | ICD-10-CM | POA: Insufficient documentation

## 2019-06-15 DIAGNOSIS — E785 Hyperlipidemia, unspecified: Secondary | ICD-10-CM

## 2019-06-15 DIAGNOSIS — M5441 Lumbago with sciatica, right side: Secondary | ICD-10-CM | POA: Diagnosis not present

## 2019-06-15 DIAGNOSIS — E039 Hypothyroidism, unspecified: Secondary | ICD-10-CM | POA: Insufficient documentation

## 2019-06-15 DIAGNOSIS — R748 Abnormal levels of other serum enzymes: Secondary | ICD-10-CM | POA: Diagnosis not present

## 2019-06-15 DIAGNOSIS — R32 Unspecified urinary incontinence: Secondary | ICD-10-CM | POA: Insufficient documentation

## 2019-06-15 DIAGNOSIS — Z780 Asymptomatic menopausal state: Secondary | ICD-10-CM | POA: Insufficient documentation

## 2019-06-15 DIAGNOSIS — Z90710 Acquired absence of both cervix and uterus: Secondary | ICD-10-CM | POA: Diagnosis not present

## 2019-06-15 DIAGNOSIS — K219 Gastro-esophageal reflux disease without esophagitis: Secondary | ICD-10-CM | POA: Insufficient documentation

## 2019-06-15 DIAGNOSIS — I509 Heart failure, unspecified: Secondary | ICD-10-CM

## 2019-06-15 DIAGNOSIS — N83202 Unspecified ovarian cyst, left side: Secondary | ICD-10-CM | POA: Diagnosis not present

## 2019-06-15 DIAGNOSIS — I2510 Atherosclerotic heart disease of native coronary artery without angina pectoris: Secondary | ICD-10-CM | POA: Insufficient documentation

## 2019-06-15 DIAGNOSIS — I201 Angina pectoris with documented spasm: Secondary | ICD-10-CM | POA: Diagnosis not present

## 2019-06-15 DIAGNOSIS — F259 Schizoaffective disorder, unspecified: Secondary | ICD-10-CM | POA: Insufficient documentation

## 2019-06-15 NOTE — Progress Notes (Addendum)
Gynecologic Oncology Consult Visit   Referring Provider: Prentice Docker, MD  Chief Concern: Bilateral ovarian cysts  Subjective:  Valerie Vazquez is a 63 y.o. G6P77fmale s/p hys (bilateral ovaries in situ) who is seen in consultation from Dr. JGlennon Macfor bilateral ovarian cysts.[id number] with a history of bilateral, simple-appearing ovarian cysts (see prior note).    She is not having any significant symptoms. She does have LLQ pain every 6 months that she describes as severe and like "ovulatory" pain but symptoms resolve. She is just concerned that she has cysts on her ovaries and doesn't want to wait until January to revisit the issue.  CT scan A/P 06/22/2014 2 cm benign-appearing LEFT adnexal cyst level below size surveillance recommendations.  CT scan A/P 10/28/2014 Reproductive: Status post hysterectomy. 2.0 cm left ovarian cyst (series 21/ image 70), unchanged. Right ovary is within normal limits  CT scan A/P 04/05/2016 Reproductive: No mass or other significant abnormality. Scan reviewed by me and the left ovarian cyst appears to be present.   CT scan A/P 09/29/2016 Reproductive: The bladder is mildly distended and grossly unremarkable, though difficult to fully assess given metal artifact. The patient is status post hysterectomy. No suspicious adnexal masses are seen. Scan reviewed by me and the left ovarian cyst appears to be present.    CT scan A/P 02/18/2019 Reproductive: Uterus surgically absent. Small cysts in the ovaries bilaterally unchanged.  UK[place]an A/P 06/07/2019 Right Ovary measures 4.6 x 2.0 x 2.6 cm. It is not normal in appearance. There is a simple cyst in the right ovary measuring 22.3 x 20.5 x 17.8 mm Left Ovary measures 3.7 x 2.1 x 1.9 cm. It is not normal in appearance. There is a simple cyst in the left ovary measuring 19.4 x 17.7 x 19.1 mm  CA125 - 8.1 HE4- 73.0 Postmenopausal ROMA- 1.09  (10.9% low risk)  She presents today to discuss management  options.   Problem List: Patient Active Problem List   Diagnosis Date Noted  . Cysts of both ovaries 06/15/2019  . Left wrist pain 05/20/2019  . Allergic rhinitis 01/27/2019  . Right hip pain 10/06/2018  . Oxygen saturation greater than 90% 09/28/2018  . Ovarian cyst 09/02/2018  . Orthostatic hypotension 03/21/2018  . Boil 10/27/2017  . Urinary incontinence 10/27/2017  . Elevated alkaline phosphatase level 10/27/2017  . Acute right-sided low back pain with right-sided sciatica 10/23/2017  . Subclinical hypothyroidism 08/26/2017  . Soft tissue swelling 05/29/2017  . Diabetes mellitus without complication ([place] [id number] . Acute right eye pain 03/25/2017  . Elevated glucose 02/25/2017  . Bruise 02/25/2017  . Paresthesia 02/06/2017  . Falls 01/30/2017  . Colon polyp 01/30/2017  . Right upper quadrant pain 01/12/2017  . Heart disease 12/09/2016  . Plantar fasciitis of left foot 11/07/2016  . Costochondritis 10/06/2016  . CAD (coronary artery disease) 09/28/2016  . Diarrhea 09/16/2016  . Epigastric discomfort 09/16/2016  . Schizoaffective disorder, depressive type ([place] 08/28/2016  . Suicide attempt ([place] 08/27/2016  . Colitis 04/08/2016  . Absolute anemia 01/29/2016  . Neck pain 01/29/2016  . Chronic shoulder pain (Location of Primary Source of Pain) (Left) 01/29/2016  . Osteoarthritis of hip (Bilateral) 01/29/2016  . Lumbar central spinal stenosis (moderate at L4-5; mild at L2-3 and L3-4) 01/29/2016  . Hx of cervical spine surgery 01/29/2016  . Numbness 11/19/2015  . Breast pain 10/19/2015  . Chest pain 10/14/2015  . SOB (shortness of breath) 07/31/2015  . Pleural effusion, bilateral 07/31/2015  .  Esophageal spasm 06/05/2015  . History of DVT (deep vein thrombosis) 02/25/2015  . 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 Constipation 12/12/2014  . Coronary vasospasm ([place]) 12/09/2014  . History of pulmonary embolus (PE) 09/19/2014  . Anxiety and depression 04/13/2014  . IBS  (irritable bowel syndrome) 04/11/2014  . GERD (gastroesophageal reflux disease) 04/11/2014  . Insomnia 04/11/2014  . Essential hypertension 10/28/2013  . Anemia 10/28/2013  . Hyperlipidemia 10/28/2013    Past Medical History: Past Medical History:  Diagnosis Date  . Acute right-sided low back pain with right-sided sciatica 10/23/2017  . Anemia   . Bilateral renal cysts   . CAD (coronary artery disease) 09/28/2016  . Chest pain 10/14/2015  . Chronic back pain    "upper and lower" (09/19/2014)  . Chronic shoulder pain (Location of Primary Source of Pain) (Left) 01/29/2016  . Colitis 04/08/2016  . Coronary vasospasm ([place]) 12/09/2014  . Depression    "major" (09/19/2014)  . DVT (deep venous thrombosis) ([place])   . Esophageal spasm   . Essential hypertension 10/28/2013  . Gallstones   . Gastritis   . Gastroenteritis   . GERD (gastroesophageal reflux disease)   . Headache    "couple /month lately" (09/19/2014)  . Heart disease 12/09/2016  . High blood pressure   . High cholesterol   . History of blood transfusion 1985   related to hysterectomy  . History of nuclear stress test    Myoview 1/19:  EF 65, no ischemia or scar  . History of pulmonary embolus (PE) 09/19/2014  . 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 Rule out Paget's disease of the bone with blood work.   . Hyperlipidemia 10/28/2013  . Hypertension   . IBS (irritable bowel syndrome)   . Lumbar central spinal stenosis (moderate at L4-5; mild at L2-3 and L3-4) 01/29/2016  . Migraine    "a few times/yr" (09/19/2014)  . Myocardial infarction ([place]) 10/2010 X 3   "while hospitalized"  . Neck pain 01/29/2016  . Osteoarthritis    "qwhere; mostly around my joints" (09/19/2014)  . Pleural effusion, bilateral 07/31/2015  . Pneumonia 04/2014  . PTSD (post-traumatic stress disorder)   . Pulmonary embolism ([place]) 04/2001; 09/19/2014   "after gallbladder OR; "  . Schizoaffective disorder ([place])   . Schizoaffective  disorder, depressive type ([place]) 08/28/2016  . Sleep apnea    "mild" (09/19/2014)  . Snoring    a. sleep study 5/16: No OSA  . SOB (shortness of breath) 07/31/2015  . Spinal stenosis   . Spondylosis   . Suicide attempt ([place]) 08/27/2016  . Type II diabetes mellitus ([place])    "dx'd in 2007; lost weight; no RX for ~ 4 yr now" (09/19/2014)  . Wheezing 02/25/2017    Past Surgical History: Past Surgical History:  Procedure Laterality Date  . ABDOMINAL HYSTERECTOMY  1985  . ANTERIOR CERVICAL DECOMP/DISCECTOMY FUSION  10/2012   in [place] C5-C7   . APPENDECTOMY  1985  . BACK SURGERY    . BREAST CYST EXCISION Right   . BREAST EXCISIONAL BIOPSY Right YRS AGO   NEG  . CARDIAC CATHETERIZATION   2000's; 2009  . CHOLECYSTECTOMY  2002  . COLONOSCOPY WITH PROPOFOL N/A 12/12/2016   Procedure: COLONOSCOPY WITH PROPOFOL;  Surgeon: [name], MD;  Location: [facility] ENDOSCOPY;  Service: Endoscopy;  Laterality: N/A;  . COLONOSCOPY WITH PROPOFOL N/A 02/17/2017   Procedure: COLONOSCOPY WITH PROPOFOL;  Surgeon: [name], MD;  Location: [facility] ENDOSCOPY;  Service: Gastroenterology;  Laterality: N/A;  .  ESOPHAGOGASTRODUODENOSCOPY (EGD) WITH PROPOFOL N/A 02/17/2017   Procedure: ESOPHAGOGASTRODUODENOSCOPY (EGD) WITH PROPOFOL;  Surgeon: [name], MD;  Location[facility] ENDOSCOPY;  Service: Gastroenterology;  Laterality: N/A;  . ESOPHAGOGASTRODUODENOSCOPY (EGD) WITH PROPOFOL N/A 03/07/2019   Procedure: ESOPHAGOGASTRODUODENOSCOPY (EGD) WITH PROPOFOL;  Surgeon: [name], MD;  Location: [facility] ENDOSCOPY;  Service: Gastroenterology;  Laterality: N/A;  . EXCISION/RELEASE BURSA HIP Right 12/1984  . HERNIA REPAIR  1985  . LEFT HEART CATHETERIZATION WITH CORONARY ANGIOGRAM N/A 12/09/2014   Procedure: LEFT HEART CATHETERIZATION WITH CORONARY ANGIOGRAM;  Surgeon: Burnell Blanks, MD;  Location[facility] CATH LAB;  Service: Cardiovascular;  Laterality: N/A;  . TONSILLECTOMY AND ADENOIDECTOMY  ~ 1968  . TOTAL HIP ARTHROPLASTY  Left 04-06-2014  . UMBILICAL HERNIA REPAIR  1985  . VENA CAVA FILTER PLACEMENT  03/2014    Past Gynecologic History:  Menarche: 11 Menstrual details: Lasts 5 days Menses regular: Postmenopausal History of Abnormal pap: No   OB History:  OB History  Gravida Para Term Preterm AB Living  '6 4 4   2 4  '$ SAB TAB Ectopic Multiple Live Births  2       4    # Outcome Date GA Lbr Len/2nd Weight Sex Delivery Anes PTL Lv  6 SAB           5 SAB           4 Term           3 Term           2 Term           1 Term             Family History: Family History  Problem Relation Age of Onset  . Stroke Mother        Deceased  . Lung cancer Father        Deceased  . Healthy Daughter   . Healthy Son        x 2  . Other Son        Suicide  . Cancer Brother   . Heart attack Neg Hx     Social History: Social History   Socioeconomic History  . Marital status: Divorced    Spouse name: Not on file  . Number of children: Not on file  . Years of education: Not on file  . Highest education level: Not on file  Occupational History  . Not on file  Social Needs  . Financial resource strain: Not hard at all  . Food insecurity    Worry: Never true    Inability: Never true  . Transportation needs    Medical: No    Non-medical: No  Tobacco Use  . Smoking status: Former Smoker    Packs/day: 1.50    Years: 10.00    Pack years: 15.00    Types: Cigarettes    Quit date: 04/12/1979    Years since quitting: 40.2  . Smokeless tobacco: Never Used  Substance and Sexual Activity  . Alcohol use: No    Alcohol/week: 0.0 standard drinks  . Drug use: No  . Sexual activity: Not Currently  Lifestyle  . Physical activity    Days per week: 0 days    Minutes per session: Not on file  . Stress: Not on file  Relationships  . Social Herbalist on phone: Not on file    Gets together: Not on file    Attends religious service: Not on  file    Active member of club or organization: Not on  file    Attends meetings of clubs or organizations: Not on file    Relationship status: Not on file  . Intimate partner violence    Fear of current or ex partner: No    Emotionally abused: No    Physically abused: No    Forced sexual activity: No  Other Topics Concern  . Not on file  Social History Narrative   Lives with fiancee in an apartment on the first floor.  Has 3 grown children.  One child passed away.  On disability 1994/11/08 - 2000, 2007 - current.     Education: some college.    Allergies: Allergies  Allergen Reactions  . Abilify [Aripiprazole] Other (See Comments)    Jerking movement, slow motor skills  . Augmentin [Amoxicillin-Pot Clavulanate] Diarrhea and Nausea And Vomiting  . Bactrim [Sulfamethoxazole-Trimethoprim] Diarrhea  . Ceftin [Cefuroxime Axetil] Diarrhea  . Coconut Oil     Rash   . Diclofenac Other (See Comments)     Muscle Pain  . Dye Fdc Red [Red Dye]     "hair dye"  . Indocin [Indomethacin] Other (See Comments)    Migraines   . Linaclotide Diarrhea  . Lisinopril Diarrhea and Other (See Comments)    Other reaction(s): Dizziness, Vomiting  . Morphine Other (See Comments)    Chest Tightness  . Morphine And Related Other (See Comments)    Chest tightness   . Simvastatin Other (See Comments)    Muscle Pain  . Sulfa Antibiotics Diarrhea  . Sulfamethoxazole-Trimethoprim Diarrhea  . Wellbutrin [Bupropion] Other (See Comments)    Jerking movement Jerking movement Slurred speech  . Zetia [Ezetimibe]     Diarrhea   . Quetiapine Fumarate Palpitations  . Quetiapine Fumarate Palpitations    Current Medications: Current Outpatient Medications  Medication Sig Dispense Refill  . acetaminophen (TYLENOL) 500 MG tablet Take 1 tablet (500 mg total) by mouth every 6 (six) hours as needed. 30 tablet 0  . baclofen (LIORESAL) 10 MG tablet Take 1 tablet (10 mg total) by mouth 3 (three) times daily as needed for muscle spasms. 60 each 1  . blood glucose meter kit  and supplies KIT Dispense based on patient and insurance preference. Use once daily. Dx code E11.9. 1 each 0  . carvedilol (COREG) 3.125 MG tablet Take 1 tablet (3.125 mg total) by mouth 2 (two) times daily with a meal. 60 tablet 2  . cetirizine (ZYRTEC) 10 MG tablet Take 1 tablet (10 mg total) by mouth daily. 30 tablet 11  . diclofenac sodium (VOLTAREN) 1 % GEL Apply 2 g topically 4 (four) times daily. Right arm/shoulder arthritis 1 Tube 2  . dicyclomine (BENTYL) 20 MG tablet TAKE 1 TABLET BY MOUTH THREE TIMES A DAY AS NEEDED FOR MUSCLE SPASMS 270 tablet 0  . diphenhydrAMINE (BENADRYL) 25 MG tablet Take 1 tablet (25 mg total) by mouth every 6 (six) hours. 12 tablet 0  . ELIQUIS 5 MG TABS tablet TAKE 1 TABLET BY MOUTH TWICE A DAY 60 tablet 5  . empagliflozin (JARDIANCE) 10 MG TABS tablet Take 10 mg by mouth daily. 90 tablet 1  . escitalopram (LEXAPRO) 20 MG tablet Take 1 tablet (20 mg total) by mouth daily. 90 tablet 1  . famotidine (PEPCID) 20 MG tablet TAKE 1 TABLET BY MOUTH EVERYDAY AT BEDTIME 90 tablet 0  . furosemide (LASIX) 20 MG tablet TAKE 1 TABLET (20 MG TOTAL) BY MOUTH DAILY  AS NEEDED FOR EDEMA. 90 tablet 1  . gabapentin (NEURONTIN) 600 MG tablet Take 1-1.5 tablets (600-900 mg total) by mouth 2 (two) times daily. Take 1 tablet in the AM and 1.5 tablet at bedtime 225 tablet 1  . isosorbide mononitrate (IMDUR) 30 MG 24 hr tablet TAKE 1.5 TABLETS (45 MG TOTAL) BY MOUTH DAILY. 135 tablet 1  . JANUVIA 100 MG tablet TAKE 1 TABLET BY MOUTH EVERY DAY 90 tablet 1  . KLOR-CON M20 20 MEQ tablet TAKE 1 TABLET (20 MEQ TOTAL) BY MOUTH DAILY AS NEEDED (TAKE WITH LASIX). 90 tablet 1  . levothyroxine (SYNTHROID) 25 MCG tablet TAKE 1 TABLET BY MOUTH BEFORE BREAKFAST 2-3 HOURS BEFORE TAKING ANY OTHER MEDICATION OR SUPPLEMENT 90 tablet 3  . lidocaine (LIDODERM) 5 % Place 1 patch onto the skin daily. Remove & Discard patch within 12 hours. 30 patch 0  . Multiple Vitamins-Minerals (SUPER THERA VITE M) TABS Take  by mouth.    Marland Kitchen NIFEdipine (PROCARDIA) 10 MG capsule TAKE 1 CAPSULE BY MOUTH 2 TIMES DAILY. TAKE 30 MINS BEFORE MEALS UP TO 1X PER DAY (Patient not taking: Reported on 06/15/2019) 180 capsule 1  . nitroGLYCERIN (NITROSTAT) 0.4 MG SL tablet Place 1 tablet (0.4 mg total) under the tongue every 5 (five) minutes as needed for chest pain. 25 tablet 1  . OLANZapine (ZYPREXA) 10 MG tablet TAKE 1 TABLET BY MOUTH EVERYDAY AT BEDTIME 90 tablet 1  . ondansetron (ZOFRAN) 4 MG tablet TAKE 1 TABLET (4 MG TOTAL) BY MOUTH 2 (TWO) TIMES DAILY AS NEEDED FOR NAUSEA OR VOMITING. 30 tablet 0  . pantoprazole (PROTONIX) 40 MG tablet TAKE 1 TABLET BY MOUTH EVERY DAY 90 tablet 1  . penicillin v potassium (VEETID) 500 MG tablet Take 1 tablet (500 mg total) by mouth 4 (four) times daily. 40 tablet 0  . pravastatin (PRAVACHOL) 10 MG tablet TAKE 1 TABLET BY MOUTH EVERYDAY AT BEDTIME 90 tablet 1   No current facility-administered medications for this visit.     Review of Systems General: positive for weight loss and weight gain, negative for, fevers Skin: negative for changes in color, texture, moles or lesions Eyes: negative for, changes in vision HEENT: positive for tinnitus, negative for, change in hearing, voice changes Pulmonary: positive for wheezing and shortness of breath, negative for, productive cough Cardiac: positive for chest pain and palpitations sometimes as well as orthopnea Gastrointestinal: positive for constipation, nausea and vomiting, negative for, diarrhea, hematemesis, hematochezia Genitourinary/Sexual: negative for, dysuria, retention, incontinence Ob/Gyn:negative for irregular bleeding Musculoskeletal: positive for back pain and joint pain Hematology: Positive for abnormal bleeding, bruising Neurologic/Psych: positive for weakness and neuropathy, negative for, headaches, seizures, paralysis  Objective:  Physical Examination:  BP 97/70 (BP Location: Left Arm, Patient Position: Sitting)   Pulse  86   Temp 98.2 F (36.8 C) (Tympanic)   Resp 20   Ht '5\' 6"'$  (1.676 m)   Wt 223 lb 11.2 oz (101.5 kg)   BMI 36.11 kg/m    ECOG Performance Status: 3 - Symptomatic, >50% confined to bed activity limited due to chronic back and hip pain  General appearance: alert, cooperative and appears stated age HEENT:PERRLA, extra ocular movement intact and sclera clear, anicteric Lymph node survey: non-palpable, axillary, inguinal, supraclavicular Cardiovascular: regular rate and rhythm Respiratory: normal air entry, lungs clear to auscultation Abdomen: no hernias and well healed incision no hepatomegaly or ascites Extremities: edema mild 1+-2+ bilateral Skin exam - normal coloration and turgor, no rashes, no suspicious skin  lesions noted. Neurological exam reveals alert, oriented, normal speech, no focal findings or movement disorder noted.  Pelvic: exam chaperoned by NP;  Vulva: normal appearing vulva with no masses, tenderness or lesions; Vagina: normal vagina; Adnexa: normal adnexa in size, nontender and no masses; Uterus: surgically absent, vaginal cuff well healed; Cervix: absent; Rectal: not indicated    Lab Review Labs on site today: As HPI  Radiologic Imaging: As per HPI    Assessment:  MATA ROWEN is a 63 y.o. female diagnosed with bilateral ovarian cysts, simple appearance, intermittently symptomatic.    Medical co-morbidities complicating care: CAD (coronary artery disease) with h/o Myocardial infarction [facility]), DVT (deep venous thrombosis) with history of pulmonary embolus (PE), Type II diabetes mellitus ([place]), GERD (gastroesophageal reflux disease) HTN and obesity.   Plan:   Problem List Items Addressed This Visit      Endocrine   Cysts of both ovaries - Primary      We discussed options for management. Given the stability of the cysts, reassuring tumor markers, low risk of malignancy, and occasional symptoms as well as significant comorbid conditions I have  recommended observation. As she does have occasional symptoms repeat imaging to assess stability is an option versus using symptoms to guided imaging. We tried to call her son, Mr. Judie Petit, three times for the conversation.   The patient's diagnosis, an outline of the further diagnostic and laboratory studies which will be required, the recommendation, and alternatives were discussed.  All questions were answered to the patient's satisfaction.  A total of 60 minutes were spent with the patient/family today; >50% was spent in education, counseling and coordination of care for ovarian cyst. The patient was seen in conjunction with Beckey Rutter, NP. I performed key elements of the exam and all the counseling.   Karra Pink Gaetana Michaelis, MD    CC:  Will Bonnet, MD [address] [place],  [place] [id number] [phone]

## 2019-06-15 NOTE — Patient Instructions (Signed)
Ovarian Cyst An ovarian cyst is a fluid-filled sac on an ovary. The ovaries are organs that make eggs in women. Most ovarian cysts go away on their own and are not cancerous (are benign). Some cysts need treatment. Follow these instructions at home:  Take over-the-counter and prescription medicines only as told by your doctor.  Do not drive or use heavy machinery while taking prescription pain medicine.  Get pelvic exams and Pap tests as often as told by your doctor.  Return to your normal activities as told by your doctor. Ask your doctor what activities are safe for you.  Do not use any products that contain nicotine or tobacco, such as cigarettes and e-cigarettes. If you need help quitting, ask your doctor.  Keep all follow-up visits as told by your doctor. This is important. Contact a doctor if:  Your periods are: ? Late. ? Irregular. ? Painful.   Your periods stop.  You have pelvic pain that does not go away.  You have pressure on your bladder.  You have trouble making your bladder empty when you pee (urinate).  You have pain during sex.  You have any of the following in your belly (abdomen): ? A feeling of fullness. ? Pressure. ? Discomfort. ? Pain that does not go away. ? Swelling.  You feel sick most of the time.  You have trouble pooping (have constipation).  You are not as hungry as usual (you lose your appetite).  You get very bad acne.  You start to have more hair on your body and face.  You are gaining weight or losing weight without changing your exercise and eating habits.  You think you may be pregnant. Get help right away if:  You have belly pain that is very bad or gets worse.  You cannot eat or drink without throwing up (vomiting).  You suddenly get a fever.  Your period is a lot heavier than usual. This information is not intended to replace advice given to you by your health care provider. Make sure you discuss any questions you have  with your health care provider. Document Released: 02/04/2008 Document Revised: 07/31/2017 Document Reviewed: 01/20/2016 Elsevier Patient Education  2020 Elsevier Inc.  

## 2019-06-16 ENCOUNTER — Other Ambulatory Visit: Payer: Self-pay | Admitting: Family Medicine

## 2019-06-16 LAB — COMPREHENSIVE METABOLIC PANEL
ALT: 19 U/L (ref 0–35)
AST: 17 U/L (ref 0–37)
Albumin: 3.9 g/dL (ref 3.5–5.2)
Alkaline Phosphatase: 95 U/L (ref 39–117)
BUN: 11 mg/dL (ref 6–23)
CO2: 25 mEq/L (ref 19–32)
Calcium: 9.5 mg/dL (ref 8.4–10.5)
Chloride: 104 mEq/L (ref 96–112)
Creatinine, Ser: 1.01 mg/dL (ref 0.40–1.20)
GFR: 66.52 mL/min (ref >60.00)
Glucose, Bld: 113 mg/dL — ABNORMAL HIGH (ref 70–99)
Potassium: 4.4 mEq/L (ref 3.5–5.1)
Sodium: 138 mEq/L (ref 135–145)
Total Bilirubin: 0.5 mg/dL (ref 0.2–1.2)
Total Protein: 6.7 g/dL (ref 6.0–8.3)

## 2019-06-16 LAB — LIPID PANEL
Cholesterol: 242 mg/dL — ABNORMAL HIGH (ref 0–200)
HDL: 39.2 mg/dL (ref >39.00)
LDL Cholesterol: 175 mg/dL — ABNORMAL HIGH (ref 0–99)
NonHDL: 203.15
Total CHOL/HDL Ratio: 6
Triglycerides: 139 mg/dL (ref 0.0–149.0)
VLDL: 27.8 mg/dL (ref 0.0–40.0)

## 2019-06-16 LAB — TSH: TSH: 1.81 u[IU]/mL (ref 0.35–4.50)

## 2019-06-17 ENCOUNTER — Other Ambulatory Visit: Payer: Self-pay | Admitting: Family Medicine

## 2019-06-17 MED ORDER — REPATHA SURECLICK 140 MG/ML ~~LOC~~ SOAJ: 140.0000 mg | SUBCUTANEOUS | 2 refills | Status: DC

## 2019-06-18 ENCOUNTER — Other Ambulatory Visit: Payer: Self-pay | Admitting: Family Medicine

## 2019-06-21 ENCOUNTER — Telehealth: Payer: Self-pay | Admitting: Family Medicine

## 2019-06-21 NOTE — Telephone Encounter (Signed)
Please print the labs from 06/15/19 and fax to Estella Husk at [phone]. Thanks.

## 2019-06-22 NOTE — Telephone Encounter (Signed)
Labs printed & faxed. 

## 2019-06-27 ENCOUNTER — Other Ambulatory Visit: Payer: Self-pay | Admitting: Cardiovascular Disease

## 2019-06-29 IMAGING — DX DG CHEST 2V
2 series · 2 of 2 positions shown · non-contrast
Comparison: 02/23/2017 chest CT

CLINICAL DATA: Pleuritic chest pain

EXAM:
CHEST  2 VIEW

[chest pa]
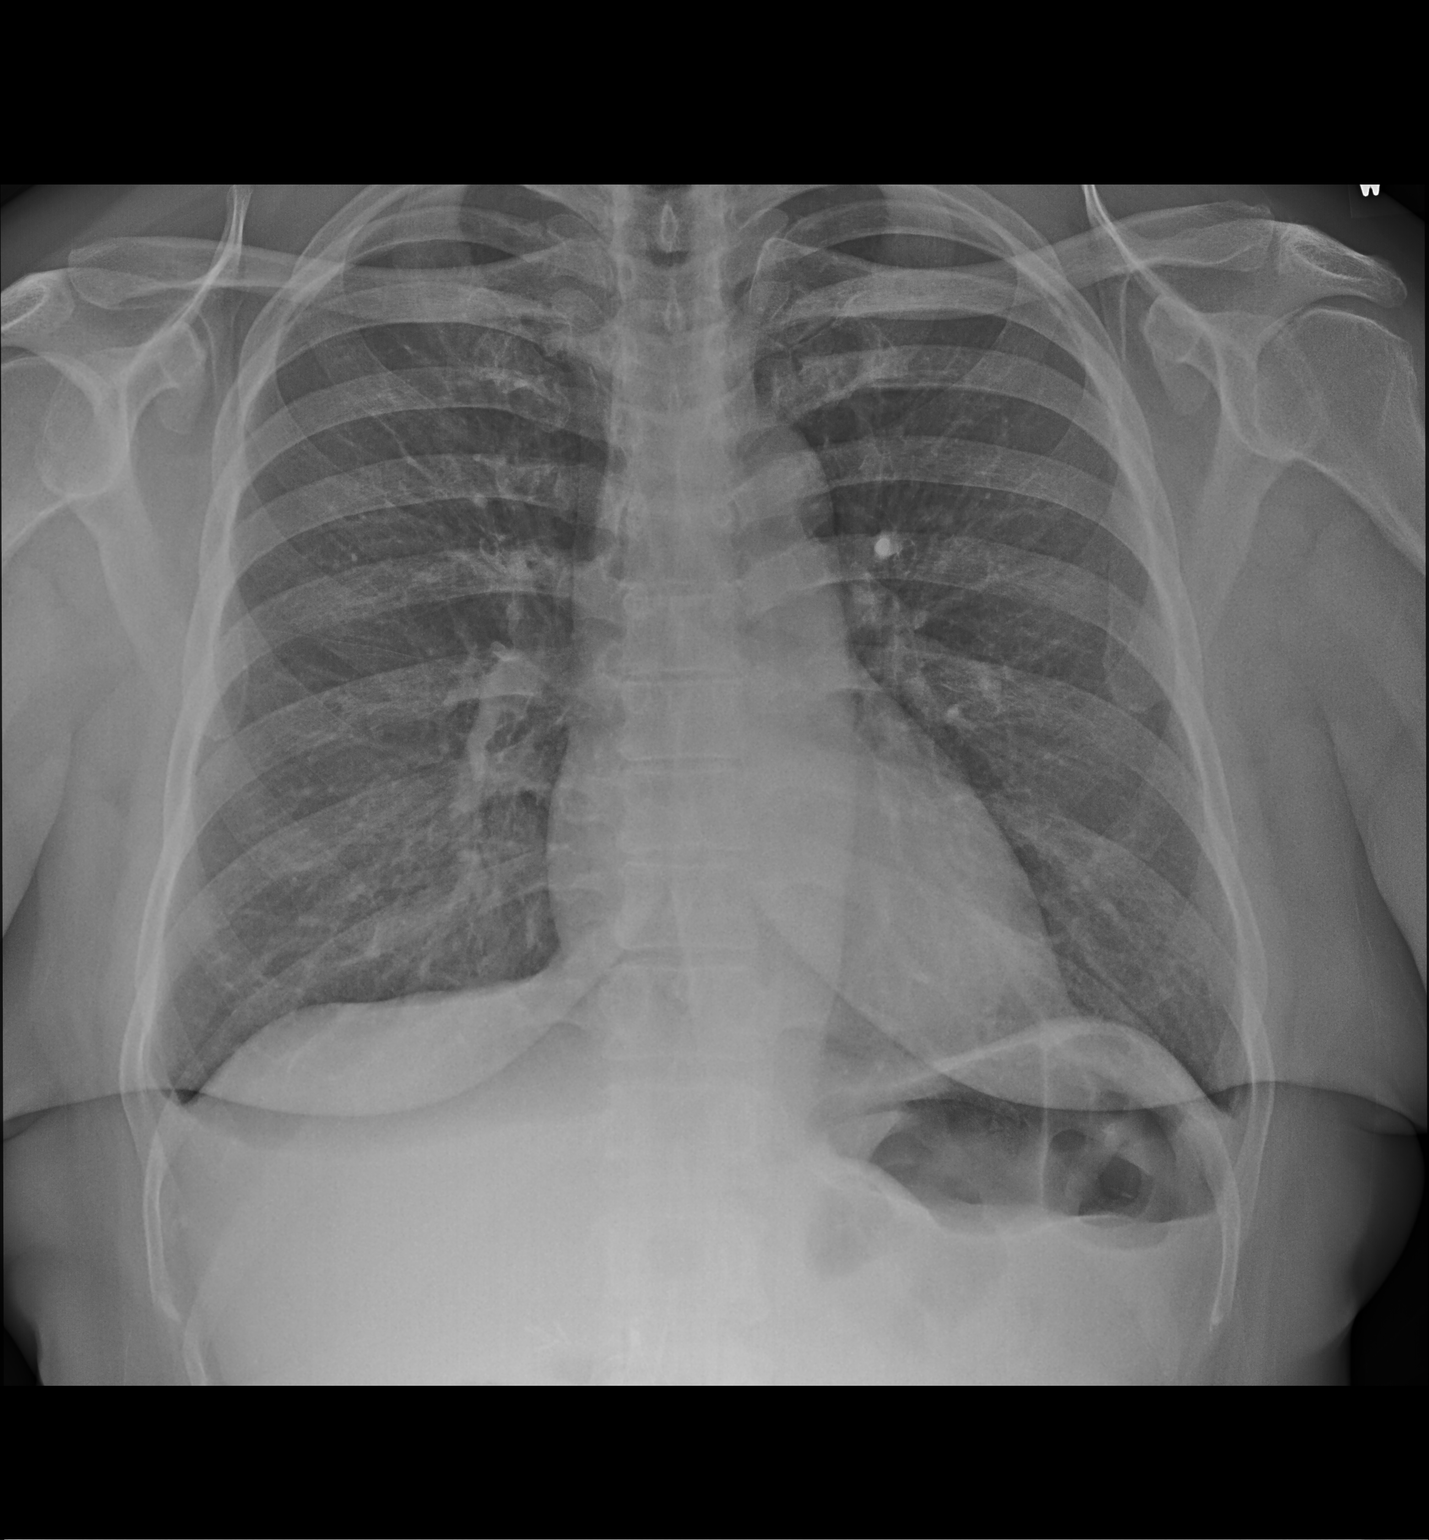

[chest lat]
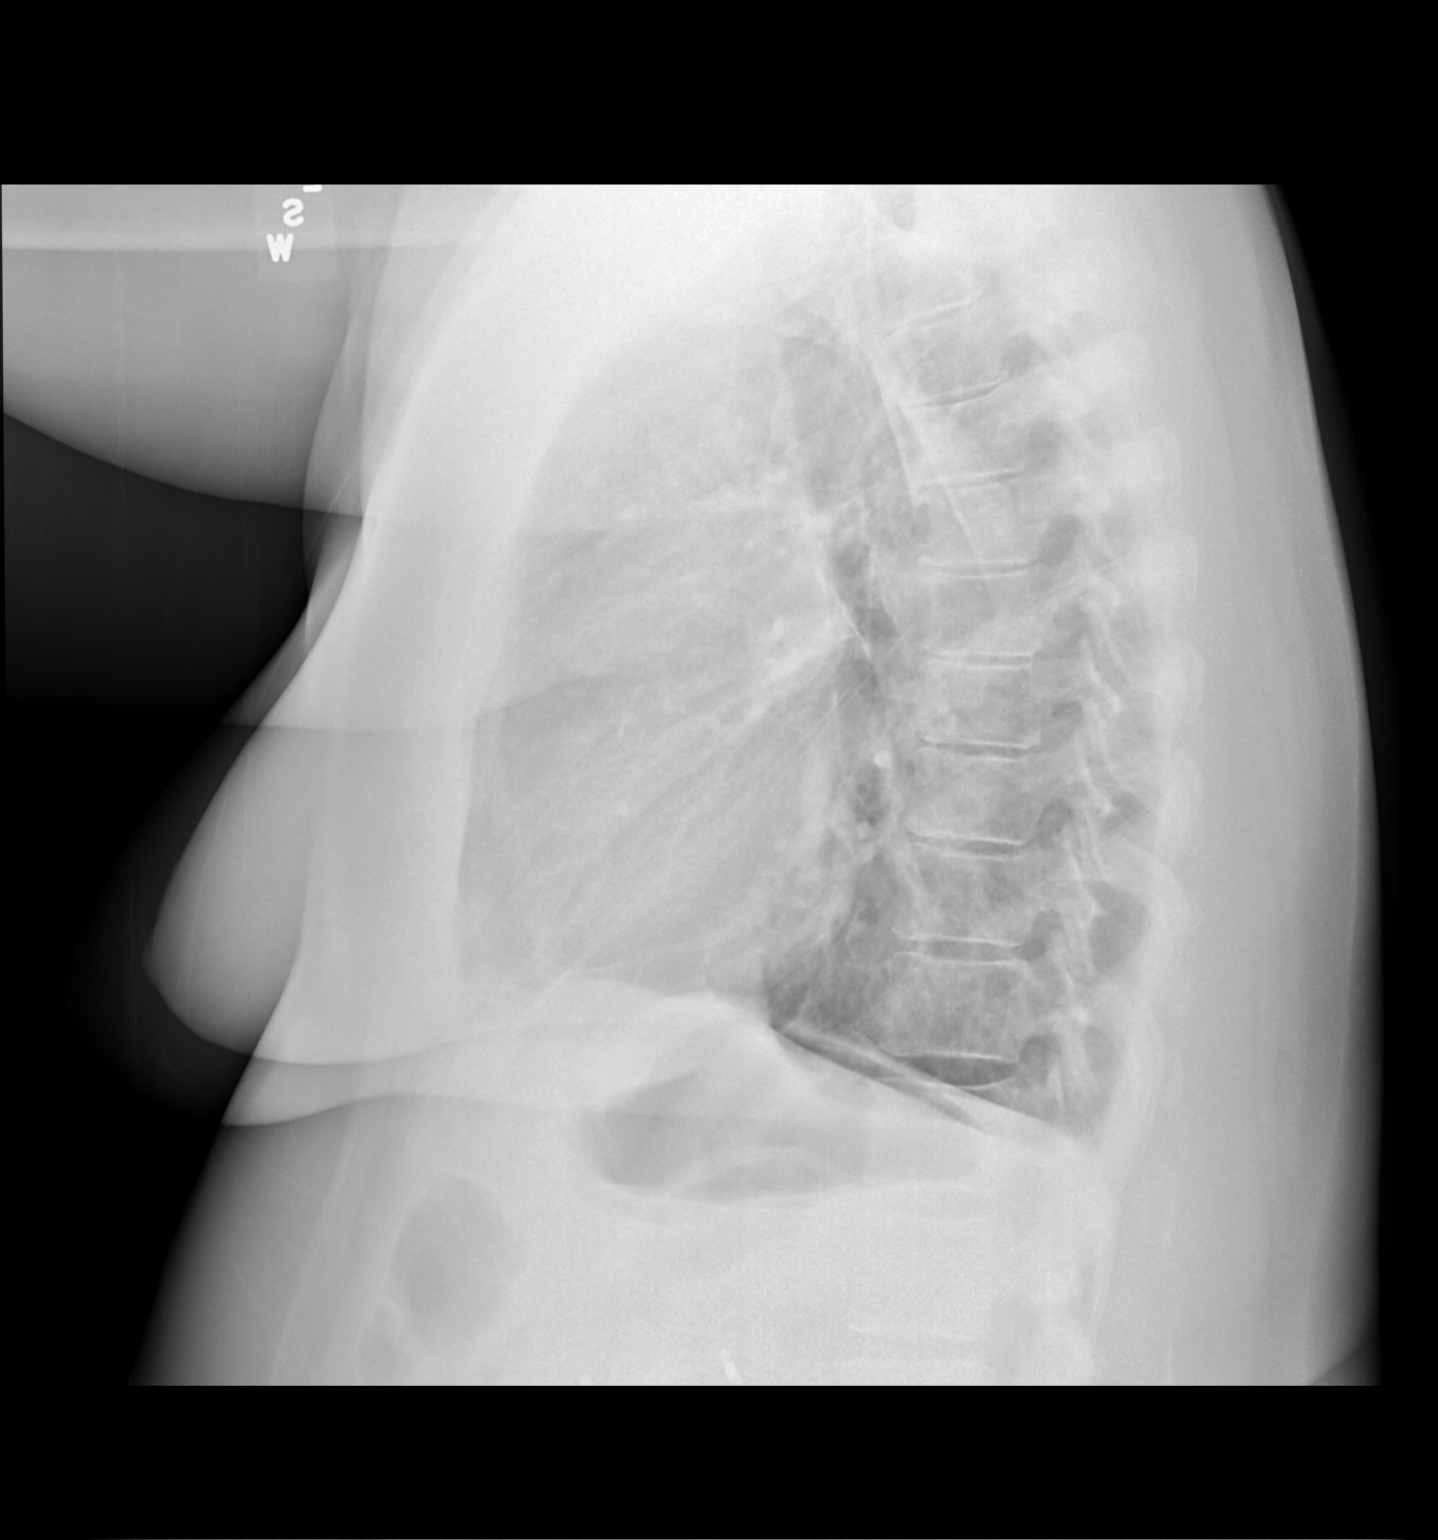

[2 of 2 positions shown; findings below may reference images not displayed]

FINDINGS: Trace bilateral pleural effusion. No edema or visible pneumonia.
Normal heart size. Negative mediastinal contours. Cholecystectomy
clips, IVC filter, and ACDF.
IMPRESSION: Trace bilateral pleural effusion.  No edema or pneumonia.

## 2019-07-08 ENCOUNTER — Ambulatory Visit: Payer: Medicare HMO | Admitting: Cardiovascular Disease

## 2019-07-08 ENCOUNTER — Ambulatory Visit: Payer: Medicare HMO | Admitting: Family Medicine

## 2019-07-11 DIAGNOSIS — M47816 Spondylosis without myelopathy or radiculopathy, lumbar region: Secondary | ICD-10-CM | POA: Diagnosis not present

## 2019-07-11 DIAGNOSIS — M545 Low back pain: Secondary | ICD-10-CM | POA: Diagnosis not present

## 2019-07-11 DIAGNOSIS — G8929 Other chronic pain: Secondary | ICD-10-CM | POA: Diagnosis not present

## 2019-07-24 IMAGING — NM NM MISC PROCEDURE
6 series · 36 of 36 positions shown · non-contrast
Comparison: none

[Series 1: rest · 6.51mm/px · 6 of 64 frames shown]
[frame 6/64]
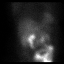
[frame 16/64]
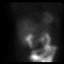
[frame 27/64]
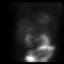
[frame 38/64]
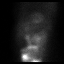
[frame 48/64]
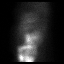
[frame 59/64]
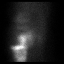

[Series 1: wbr_r-proj_st rest · 6.51mm/px · 6 of 64 frames shown]
[frame 6/64]
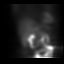
[frame 16/64]
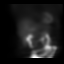
[frame 27/64]
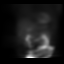
[frame 38/64]
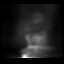
[frame 48/64]
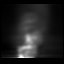
[frame 59/64]
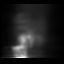

[Series 2: wbr_s-proj_st stress - gated · 6.51mm/px · 6 of 512 frames shown]
[frame 43/512]
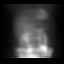
[frame 128/512]
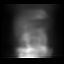
[frame 214/512]
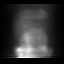
[frame 299/512]
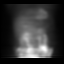
[frame 384/512]
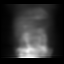
[frame 470/512]
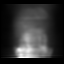

[Series 2: stress - gated · 6.51mm/px · 6 of 512 frames shown]
[frame 43/512]
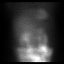
[frame 128/512]
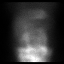
[frame 214/512]
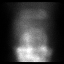
[frame 299/512]
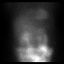
[frame 384/512]
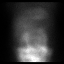
[frame 470/512]
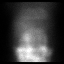

[Series 3: stress - perfusion · 6.51mm/px · 6 of 64 frames shown]
[frame 6/64]
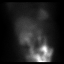
[frame 16/64]
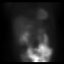
[frame 27/64]
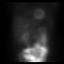
[frame 38/64]
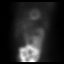
[frame 48/64]
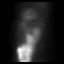
[frame 59/64]
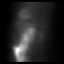

[Series 3: wbr_s-proj_st stress - perfusion · 6.51mm/px · 6 of 64 frames shown]
[frame 6/64]
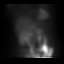
[frame 16/64]
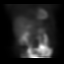
[frame 27/64]
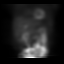
[frame 38/64]
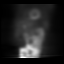
[frame 48/64]
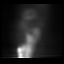
[frame 59/64]
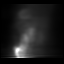

[36 of 36 positions shown; findings below may reference images not displayed]

Canned report from images found in remote index.

Refer to host system for actual result text.

## 2019-07-26 DIAGNOSIS — M62562 Muscle wasting and atrophy, not elsewhere classified, left lower leg: Secondary | ICD-10-CM | POA: Diagnosis not present

## 2019-07-26 DIAGNOSIS — M62561 Muscle wasting and atrophy, not elsewhere classified, right lower leg: Secondary | ICD-10-CM | POA: Diagnosis not present

## 2019-07-27 ENCOUNTER — Other Ambulatory Visit: Payer: Self-pay | Admitting: Family Medicine

## 2019-07-30 ENCOUNTER — Other Ambulatory Visit: Payer: Self-pay | Admitting: Family Medicine

## 2019-07-30 DIAGNOSIS — R110 Nausea: Secondary | ICD-10-CM

## 2019-08-11 ENCOUNTER — Telehealth: Payer: Self-pay | Admitting: Family Medicine

## 2019-08-11 ENCOUNTER — Encounter: Payer: Self-pay | Admitting: Family Medicine

## 2019-08-11 DIAGNOSIS — F250 Schizoaffective disorder, bipolar type: Secondary | ICD-10-CM

## 2019-08-11 MED ORDER — OLANZAPINE 10 MG PO TABS: ORAL_TABLET | ORAL | 0 refills | Status: DC

## 2019-08-11 MED ORDER — ESCITALOPRAM OXALATE 20 MG PO TABS: 20.0000 mg | ORAL_TABLET | Freq: Every day | ORAL | 1 refills | Status: DC

## 2019-08-11 NOTE — Telephone Encounter (Signed)
It has been 2 months since she was last evaluated for this. She needs to complete a visit for re-evaluation to help determine the appropriateness of antibiotic treatment. Thanks.

## 2019-08-11 NOTE — Telephone Encounter (Signed)
Scheduled the patient to see the provider tomorrow for her abscess.  Dayja Loveridge,cma

## 2019-08-11 NOTE — Telephone Encounter (Signed)
Pt called to see if Lauren Guse would refill the Rx for penicillin v potassium (VEETID) 500 MG tablet  For Pt abscess / please advise Lasasha Brophy,cma

## 2019-08-11 NOTE — Telephone Encounter (Signed)
Pt called to see if Lauren Guse would refill the Rx for penicillin v potassium (VEETID) 500 MG tablet  For Pt abscess / please advise

## 2019-08-12 ENCOUNTER — Ambulatory Visit (INDEPENDENT_AMBULATORY_CARE_PROVIDER_SITE_OTHER): Payer: Medicare HMO | Admitting: Family Medicine

## 2019-08-12 ENCOUNTER — Other Ambulatory Visit: Payer: Self-pay

## 2019-08-12 ENCOUNTER — Encounter: Payer: Self-pay | Admitting: Family Medicine

## 2019-08-12 VITALS — Ht 66.0 in | Wt 223.0 lb

## 2019-08-12 DIAGNOSIS — F251 Schizoaffective disorder, depressive type: Secondary | ICD-10-CM | POA: Diagnosis not present

## 2019-08-12 DIAGNOSIS — K0889 Other specified disorders of teeth and supporting structures: Secondary | ICD-10-CM | POA: Diagnosis not present

## 2019-08-12 DIAGNOSIS — R1013 Epigastric pain: Secondary | ICD-10-CM

## 2019-08-12 DIAGNOSIS — K047 Periapical abscess without sinus: Secondary | ICD-10-CM

## 2019-08-12 DIAGNOSIS — R69 Illness, unspecified: Secondary | ICD-10-CM | POA: Diagnosis not present

## 2019-08-12 DIAGNOSIS — K219 Gastro-esophageal reflux disease without esophagitis: Secondary | ICD-10-CM

## 2019-08-12 DIAGNOSIS — E119 Type 2 diabetes mellitus without complications: Secondary | ICD-10-CM

## 2019-08-12 DIAGNOSIS — Z780 Asymptomatic menopausal state: Secondary | ICD-10-CM

## 2019-08-12 MED ORDER — FAMOTIDINE 20 MG PO TABS: ORAL_TABLET | ORAL | 0 refills | Status: DC

## 2019-08-12 MED ORDER — PENICILLIN V POTASSIUM 500 MG PO TABS: 500.0000 mg | ORAL_TABLET | Freq: Four times a day (QID) | ORAL | 0 refills | Status: DC

## 2019-08-12 NOTE — Telephone Encounter (Signed)
Sent through to [employer].

## 2019-08-12 NOTE — Assessment & Plan Note (Signed)
Denies depression.  She will continue her current regimen.  I discussed that she needs to follow-up with psychiatry.  Information regarding [facility] will be sent to her MyChart account.  She will contact them for further management.

## 2019-08-12 NOTE — Progress Notes (Signed)
Virtual Visit via video Note  This visit type was conducted due to national recommendations for restrictions regarding the COVID-19 pandemic (e.g. social distancing).  This format is felt to be most appropriate for this patient at this time.  All issues noted in this document were discussed and addressed.  No physical exam was performed (except for noted visual exam findings with Video Visits).   I connected with Valerie Vazquez today at 10:30 AM EST by a video enabled telemedicine application and verified that I am speaking with the correct person using two identifiers. Location patient: home Location provider: home office Persons participating in the virtual visit: patient, provider, Judie Petit (son)  I discussed the limitations, risks, security and privacy concerns of performing an evaluation and management service by telephone and the availability of in person appointments. I also discussed with the patient that there may be a patient responsible charge related to this service. The patient expressed understanding and agreed to proceed.   Reason for visit: same day visit.   HPI: Dental infection: Patient notes onset over the last several days.  She has recurrent issues with this.  She notes its in the left upper front.  At the same place she usually has an issue with.  The tooth is broken off and she notes an abscess at the posterior aspect of the tooth.  No fevers.  No facial swelling.  No drainage.  No erythema.  She has seen a dentist though she is not able to afford to go back to them.  Diabetes: Not checking sugars.  Taking Januvia and Jardiance.  No polyuria or polydipsia.  She notes rare hypoglycemia where she feels shaky.  She will drink some juice and it will get better.  She is due to see ophthalmology.  Depression: She has no depressive symptoms.  No anxiety.  She is taking Lexapro and Zyprexa.  She needs to establish with a new psychiatrist.  GERD: Famotidine resolves her  symptoms.  She needs a refill.  No reflux.  No blood in her stool.   ROS: See pertinent positives and negatives per HPI.  Past Medical History:  Diagnosis Date  . Acute right-sided low back pain with right-sided sciatica 10/23/2017  . Anemia   . Bilateral renal cysts   . CAD (coronary artery disease) 09/28/2016  . Chest pain 10/14/2015  . Chronic back pain    "upper and lower" (09/19/2014)  . Chronic shoulder pain (Location of Primary Source of Pain) (Left) 01/29/2016  . Colitis 04/08/2016  . Coronary vasospasm ([place]) 12/09/2014  . Depression    "major" (09/19/2014)  . DVT (deep venous thrombosis) ([place])   . Esophageal spasm   . Essential hypertension 10/28/2013  . Gallstones   . Gastritis   . Gastroenteritis   . GERD (gastroesophageal reflux disease)   . Headache    "couple /month lately" (09/19/2014)  . Heart disease 12/09/2016  . High blood pressure   . High cholesterol   . History of blood transfusion 1985   related to hysterectomy  . History of nuclear stress test    Myoview 1/19:  EF 65, no ischemia or scar  . History of pulmonary embolus (PE) 09/19/2014  . Hx of cervical spine surgery 01/29/2016  . 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 Rule out Paget's disease of the bone with blood work.   . Hyperlipidemia 10/28/2013  . Hypertension   . IBS (irritable bowel syndrome)   . Lumbar central spinal stenosis (moderate  at L4-5; mild at L2-3 and L3-4) 01/29/2016  . Migraine    "a few times/yr" (09/19/2014)  . Myocardial infarction ([place]) 10/2010 X 3   "while hospitalized"  . Neck pain 01/29/2016  . Osteoarthritis    "qwhere; mostly around my joints" (09/19/2014)  . Pleural effusion, bilateral 07/31/2015  . Pneumonia 04/2014  . PTSD (post-traumatic stress disorder)   . Pulmonary embolism ([place]) 04/2001; 09/19/2014   "after gallbladder OR; "  . Schizoaffective disorder ([place])   . Schizoaffective disorder, depressive type ([place]) 08/28/2016  . Sleep apnea    "mild"  (09/19/2014)  . Snoring    a. sleep study 5/16: No OSA  . SOB (shortness of breath) 07/31/2015  . Spinal stenosis   . Spondylosis   . Suicide attempt ([place]) 08/27/2016  . Type II diabetes mellitus ([place])    "dx'd in 2007; lost weight; no RX for ~ 4 yr now" (09/19/2014)  . Wheezing 02/25/2017    Past Surgical History:  Procedure Laterality Date  . ABDOMINAL HYSTERECTOMY  1985  . ANTERIOR CERVICAL DECOMP/DISCECTOMY FUSION  10/2012   in [place] C5-C7   . APPENDECTOMY  1985  . BACK SURGERY    . BREAST CYST EXCISION Right   . BREAST EXCISIONAL BIOPSY Right YRS AGO   NEG  . CARDIAC CATHETERIZATION   2000's; 2009  . CHOLECYSTECTOMY  2002  . COLONOSCOPY WITH PROPOFOL N/A 12/12/2016   Procedure: COLONOSCOPY WITH PROPOFOL;  Surgeon: [name], MD;  Location: [facility] ENDOSCOPY;  Service: Endoscopy;  Laterality: N/A;  . COLONOSCOPY WITH PROPOFOL N/A 02/17/2017   Procedure: COLONOSCOPY WITH PROPOFOL;  Surgeon: [name], MD;  Location: [facility] ENDOSCOPY;  Service: Gastroenterology;  Laterality: N/A;  . ESOPHAGOGASTRODUODENOSCOPY (EGD) WITH PROPOFOL N/A 02/17/2017   Procedure: ESOPHAGOGASTRODUODENOSCOPY (EGD) WITH PROPOFOL;  Surgeon: [name], MD;  Location: [facility] ENDOSCOPY;  Service: Gastroenterology;  Laterality: N/A;  . ESOPHAGOGASTRODUODENOSCOPY (EGD) WITH PROPOFOL N/A 03/07/2019   Procedure: ESOPHAGOGASTRODUODENOSCOPY (EGD) WITH PROPOFOL;  Surgeon: [name], MD;  Location: [facility] ENDOSCOPY;  Service: Gastroenterology;  Laterality: N/A;  . EXCISION/RELEASE BURSA HIP Right 12/1984  . HERNIA REPAIR  1985  . LEFT HEART CATHETERIZATION WITH CORONARY ANGIOGRAM N/A 12/09/2014   Procedure: LEFT HEART CATHETERIZATION WITH CORONARY ANGIOGRAM;  Surgeon: Burnell Blanks, MD;  Location: [facility] CATH LAB;  Service: Cardiovascular;  Laterality: N/A;  . TONSILLECTOMY AND ADENOIDECTOMY  ~ 1968  . TOTAL HIP ARTHROPLASTY Left 04-06-2014  . UMBILICAL HERNIA REPAIR  1985  . VENA CAVA FILTER PLACEMENT  03/2014     Family History  Problem Relation Age of Onset  . Stroke Mother        Deceased  . Lung cancer Father        Deceased  . Healthy Daughter   . Healthy Son        x 2  . Other Son        Suicide  . Cancer Brother   . Heart attack Neg Hx     SOCIAL HX: Former smoker   Current Outpatient Medications:  .  acetaminophen (TYLENOL) 500 MG tablet, Take 1 tablet (500 mg total) by mouth every 6 (six) hours as needed., Disp: 30 tablet, Rfl: 0 .  baclofen (LIORESAL) 10 MG tablet, TAKE 1 TABLET BY MOUTH THREE TIMES A DAY AS NEEDED FOR MUSCLE SPASMS, Disp: 60 tablet, Rfl: 1 .  blood glucose meter kit and supplies KIT, Dispense based on patient and insurance preference. Use once daily. Dx code E11.9., Disp: 1 each, Rfl: 0 .  carvedilol (COREG) 3.125 MG tablet, Take 1 tablet (3.125 mg total) by mouth 2 (two) times daily with a meal., Disp: 60 tablet, Rfl: 2 .  cetirizine (ZYRTEC) 10 MG tablet, Take 1 tablet (10 mg total) by mouth daily., Disp: 30 tablet, Rfl: 11 .  diclofenac sodium (VOLTAREN) 1 % GEL, Apply 2 g topically 4 (four) times daily. Right arm/shoulder arthritis, Disp: 1 Tube, Rfl: 2 .  dicyclomine (BENTYL) 20 MG tablet, TAKE 1 TABLET BY MOUTH THREE TIMES A DAY AS NEEDED FOR MUSCLE SPASMS, Disp: 270 tablet, Rfl: 0 .  diphenhydrAMINE (BENADRYL) 25 MG tablet, Take 1 tablet (25 mg total) by mouth every 6 (six) hours., Disp: 12 tablet, Rfl: 0 .  ELIQUIS 5 MG TABS tablet, TAKE 1 TABLET BY MOUTH TWICE A DAY, Disp: 60 tablet, Rfl: 5 .  empagliflozin (JARDIANCE) 10 MG TABS tablet, Take 10 mg by mouth daily., Disp: 90 tablet, Rfl: 1 .  escitalopram (LEXAPRO) 20 MG tablet, Take 1 tablet (20 mg total) by mouth daily., Disp: 90 tablet, Rfl: 1 .  Evolocumab (REPATHA SURECLICK) 532 MG/ML SOAJ, Inject 140 mg into the skin every 14 (fourteen) days., Disp: 6 pen, Rfl: 2 .  famotidine (PEPCID) 20 MG tablet, TAKE 1 TABLET BY MOUTH EVERYDAY AT BEDTIME, Disp: 90 tablet, Rfl: 0 .  furosemide (LASIX) 20 MG  tablet, TAKE 1 TABLET (20 MG TOTAL) BY MOUTH DAILY AS NEEDED FOR EDEMA., Disp: 90 tablet, Rfl: 1 .  gabapentin (NEURONTIN) 600 MG tablet, Take 1-1.5 tablets (600-900 mg total) by mouth 2 (two) times daily. Take 1 tablet in the AM and 1.5 tablet at bedtime, Disp: 225 tablet, Rfl: 1 .  isosorbide mononitrate (IMDUR) 30 MG 24 hr tablet, TAKE 1.5 TABLETS (45 MG TOTAL) BY MOUTH DAILY., Disp: 135 tablet, Rfl: 3 .  JANUVIA 100 MG tablet, TAKE 1 TABLET BY MOUTH EVERY DAY, Disp: 90 tablet, Rfl: 1 .  KLOR-CON M20 20 MEQ tablet, TAKE 1 TABLET (20 MEQ TOTAL) BY MOUTH DAILY AS NEEDED (TAKE WITH LASIX)., Disp: 90 tablet, Rfl: 1 .  levothyroxine (SYNTHROID) 25 MCG tablet, TAKE 1 TABLET BY MOUTH BEFORE BREAKFAST 2-3 HOURS BEFORE TAKING ANY OTHER MEDICATION OR SUPPLEMENT, Disp: 90 tablet, Rfl: 3 .  lidocaine (LIDODERM) 5 %, Place 1 patch onto the skin daily. Remove & Discard patch within 12 hours., Disp: 30 patch, Rfl: 0 .  Multiple Vitamins-Minerals (SUPER THERA VITE M) TABS, Take by mouth., Disp: , Rfl:  .  NIFEdipine (PROCARDIA) 10 MG capsule, TAKE 1 CAPSULE BY MOUTH 2 TIMES DAILY. TAKE 30 MINS BEFORE MEALS UP TO 1X PER DAY, Disp: 180 capsule, Rfl: 1 .  nitroGLYCERIN (NITROSTAT) 0.4 MG SL tablet, PLACE 1 TABLET (0.4 MG TOTAL) UNDER THE TONGUE EVERY 5 (FIVE) MINUTES AS NEEDED FOR CHEST PAIN., Disp: 25 tablet, Rfl: 3 .  OLANZapine (ZYPREXA) 10 MG tablet, TAKE 1 TABLET BY MOUTH EVERYDAY AT BEDTIME, Disp: 90 tablet, Rfl: 0 .  ondansetron (ZOFRAN) 4 MG tablet, TAKE 1 TABLET (4 MG TOTAL) BY MOUTH 2 (TWO) TIMES DAILY AS NEEDED FOR NAUSEA OR VOMITING., Disp: 30 tablet, Rfl: 0 .  pantoprazole (PROTONIX) 40 MG tablet, TAKE 1 TABLET BY MOUTH EVERY DAY, Disp: 90 tablet, Rfl: 1 .  penicillin v potassium (VEETID) 500 MG tablet, Take 1 tablet (500 mg total) by mouth 4 (four) times daily., Disp: 40 tablet, Rfl: 0 .  pravastatin (PRAVACHOL) 10 MG tablet, TAKE 1 TABLET BY MOUTH EVERYDAY AT BEDTIME, Disp: 90 tablet, Rfl:  1  EXAM:  VITALS per patient if applicable: None.  GENERAL: alert, oriented, appears well and in no acute distress  HEENT: atraumatic, conjunttiva clear, no obvious abnormalities on inspection of external nose and ears, no apparent facial swelling or erythema  NECK: normal movements of the head and neck  LUNGS: on inspection no signs of respiratory distress, breathing rate appears normal, no obvious gross SOB, gasping or wheezing  CV: no obvious cyanosis  MS: moves all visible extremities without noticeable abnormality  PSYCH/NEURO: pleasant and cooperative, no obvious depression or anxiety, speech and thought processing grossly intact  ASSESSMENT AND PLAN:  Discussed the following assessment and plan:  Diabetes mellitus without complication ([place]) We will get a glucometer sent to her pharmacy.  She will start checking her sugars once daily fasting.  Will refer for ophthalmology.  She will continue her current medication regimen.  Follow-up in 3 months with lab work at that time.  GERD (gastroesophageal reflux disease) Adequately controlled on famotidine.  Refill sent to pharmacy.  Schizoaffective disorder, depressive type ([place]) Denies depression.  She will continue her current regimen.  I discussed that she needs to follow-up with psychiatry.  Information regarding [facility] will be sent to her MyChart account.  She will contact them for further management.  Dental infection Treating with penicillin.  We will see if anybody knows about a free dental clinic so she can get her dental issues taken care of.  Given reasons to seek medical attention in the emergency room.  Postmenopausal estrogen deficiency DEXA ordered. Patient will schedule.     I discussed the assessment and treatment plan with the patient. The patient was provided an opportunity to ask questions and all were answered. The patient agreed with the plan and demonstrated an understanding of the instructions.   The  patient was advised to call back or seek an in-person evaluation if the symptoms worsen or if the condition fails to improve as anticipated.   [name], MD

## 2019-08-12 NOTE — Assessment & Plan Note (Signed)
DEXA ordered. Patient will schedule.

## 2019-08-12 NOTE — Assessment & Plan Note (Signed)
We will get a glucometer sent to her pharmacy.  She will start checking her sugars once daily fasting.  Will refer for ophthalmology.  She will continue her current medication regimen.  Follow-up in 3 months with lab work at that time.

## 2019-08-12 NOTE — Patient Instructions (Signed)
[facility] [address] Phone [phone]  Call to arrange for psychiatrist evaluation  [place] for DEXA scan   [phone]

## 2019-08-12 NOTE — Assessment & Plan Note (Signed)
Treating with penicillin.  We will see if anybody knows about a free dental clinic so she can get her dental issues taken care of.  Given reasons to seek medical attention in the emergency room.

## 2019-08-12 NOTE — Assessment & Plan Note (Signed)
Adequately controlled on famotidine.  Refill sent to pharmacy.

## 2019-08-15 NOTE — Progress Notes (Signed)
I put in a Referral for Community resources for dental care with [facility] .

## 2019-08-17 ENCOUNTER — Other Ambulatory Visit: Payer: Self-pay | Admitting: Family Medicine

## 2019-08-18 ENCOUNTER — Other Ambulatory Visit: Payer: Self-pay | Admitting: Family Medicine

## 2019-08-18 DIAGNOSIS — E119 Type 2 diabetes mellitus without complications: Secondary | ICD-10-CM

## 2019-08-19 ENCOUNTER — Telehealth: Payer: Self-pay

## 2019-08-19 NOTE — Telephone Encounter (Signed)
Copied from [place] [id number]. Topic: Referral - Status >> Aug 19, 2019 [id number] PM Simone Curia D wrote: [phone] Left message on voicemail for patient to return my call regarding information about [facility]. Ambrose Mantle [phone]

## 2019-08-20 ENCOUNTER — Other Ambulatory Visit: Payer: Self-pay | Admitting: Cardiovascular Disease

## 2019-08-20 DIAGNOSIS — Z760 Encounter for issue of repeat prescription: Secondary | ICD-10-CM

## 2019-08-31 IMAGING — MR MR LUMBAR SPINE W/O CM
4 of 5 series · 14 of 48 positions shown · non-contrast
Comparison: MRI lumbar spine 10/19/2015.

CLINICAL DATA: Low back pain and urinary incontinence for 1 week.

EXAM:
MRI LUMBAR SPINE WITHOUT CONTRAST
TECHNIQUE: Multiplanar, multisequence MR imaging of the lumbar spine was
performed. No intravenous contrast was administered.

[Series 2: T2 · sagittal · 4.0mm · 0.44mm/px · 5 of 16 slices shown (1 of 2)]
[im 1/16]
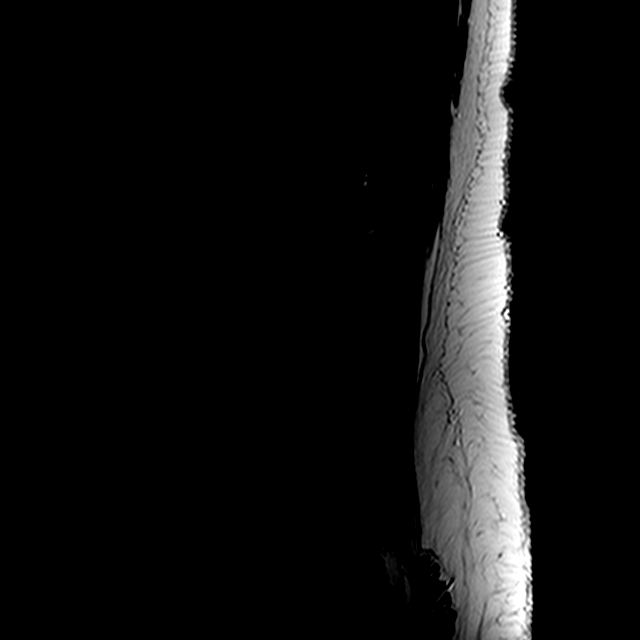
[im 4/16]
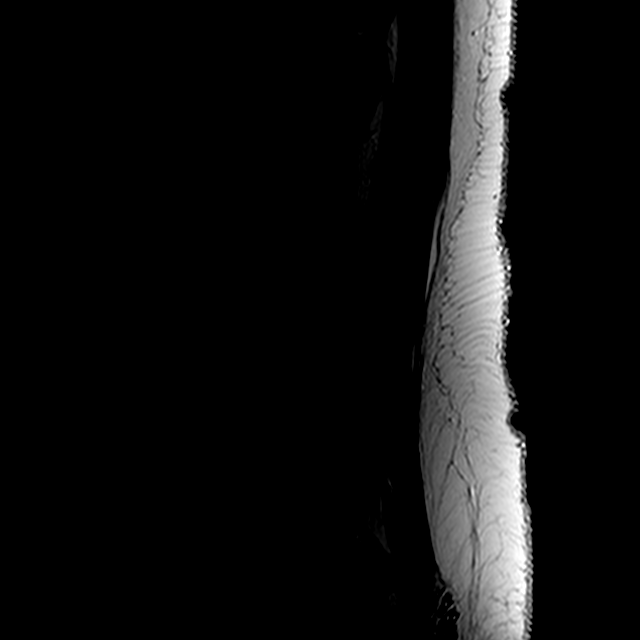
[im 7/16]
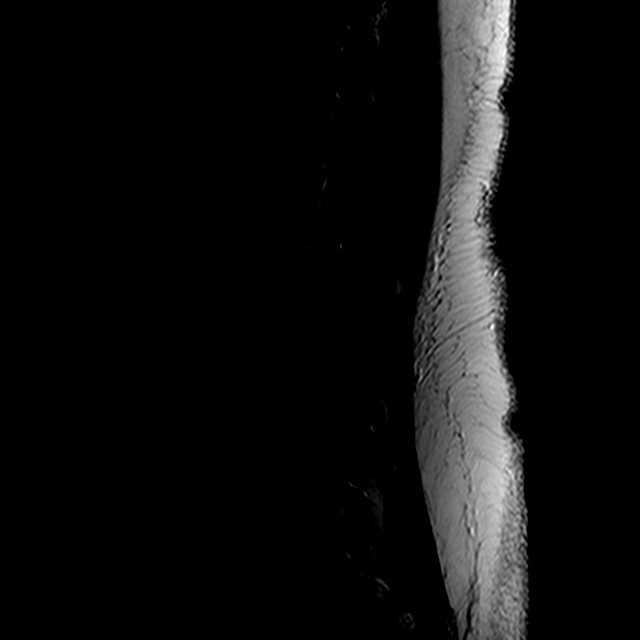
[im 10/16]
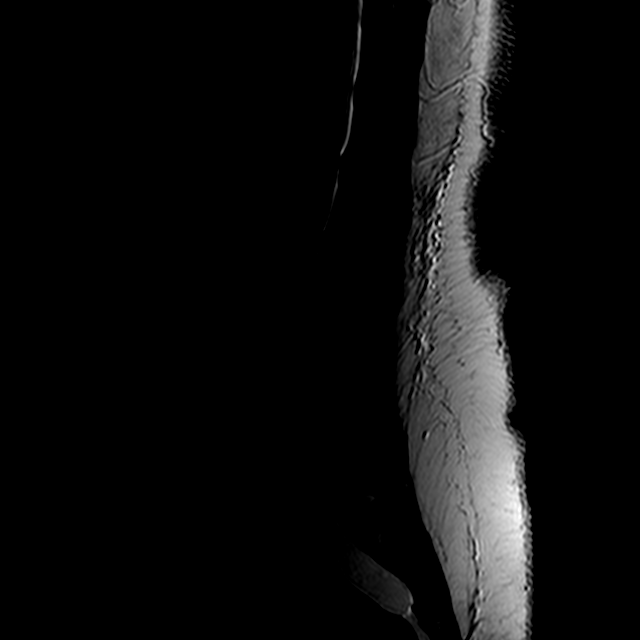
[im 16/16]
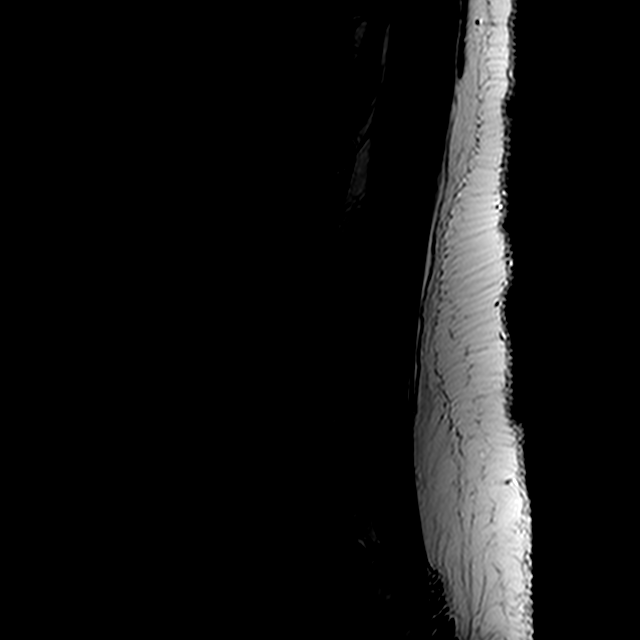

[Series 3: T1 · sagittal · 4.0mm · 0.44mm/px · 3 of 16 slices shown (1 of 2)]
[im 4/16]
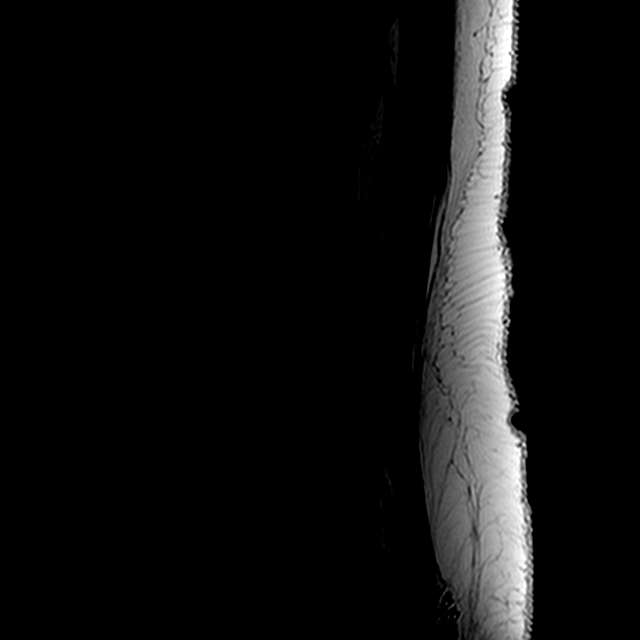
[im 10/16]
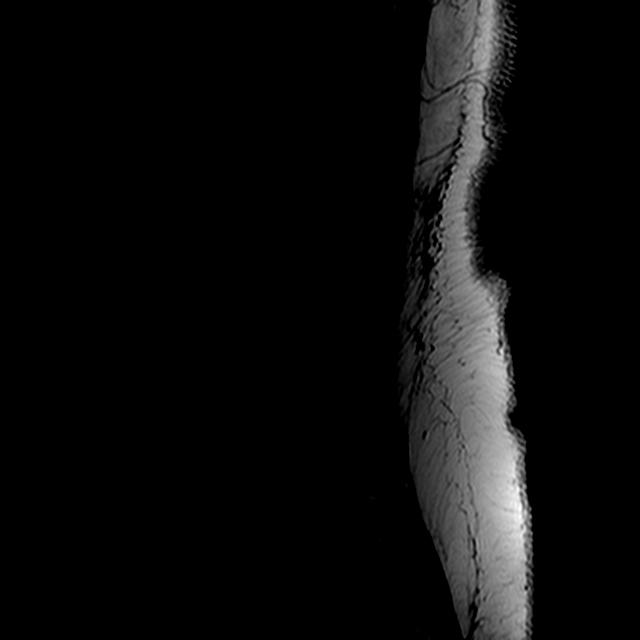
[im 16/16]
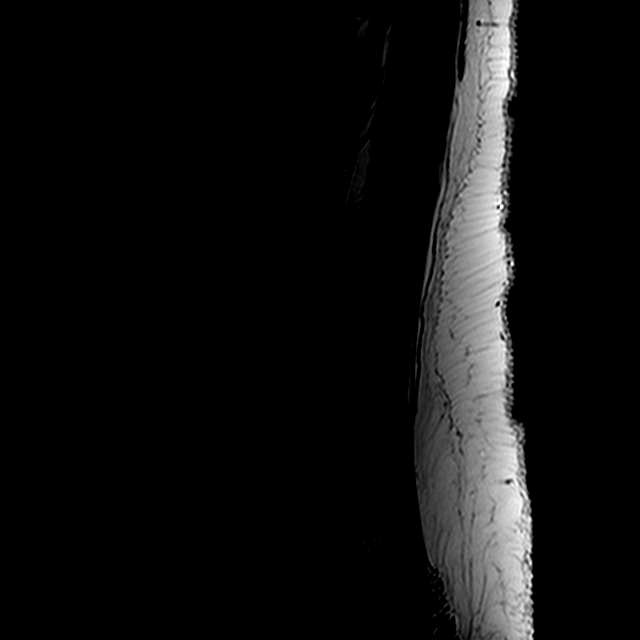

[Series 5: T2 · axial · 4.0mm · 0.39mm/px · z∈[-107,+56]mm · 3 of 39 slices shown (2 of 2)]
[im 6/39]
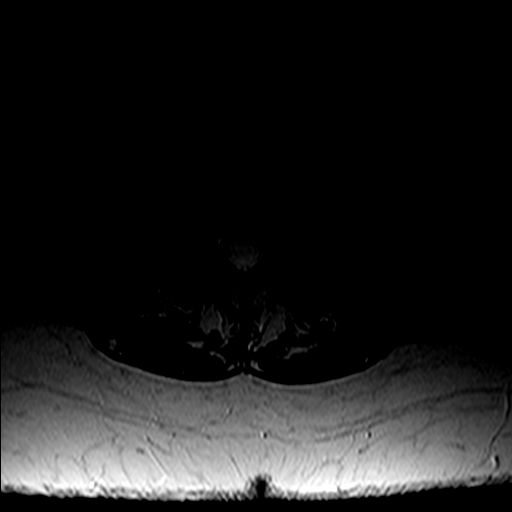
[im 20/39]
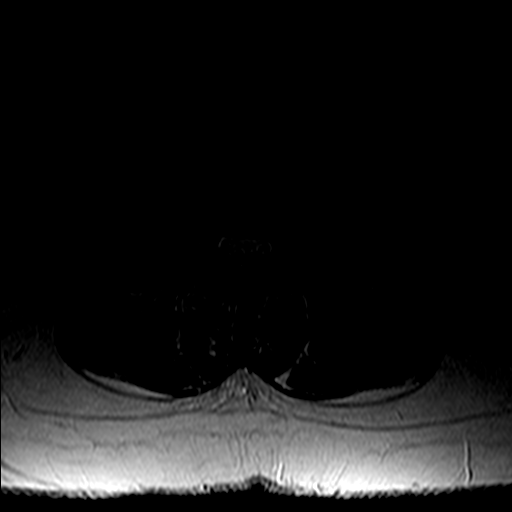
[im 33/39]
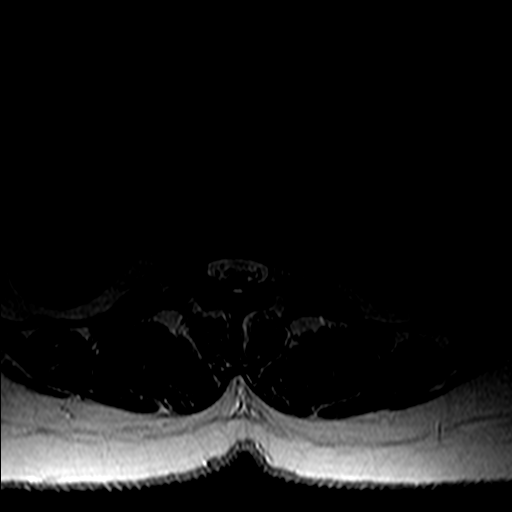

[Series 6: T1 · axial · 4.0mm · 0.39mm/px · z∈[-107,+56]mm · 3 of 39 slices shown (2 of 2)]
[im 6/39]
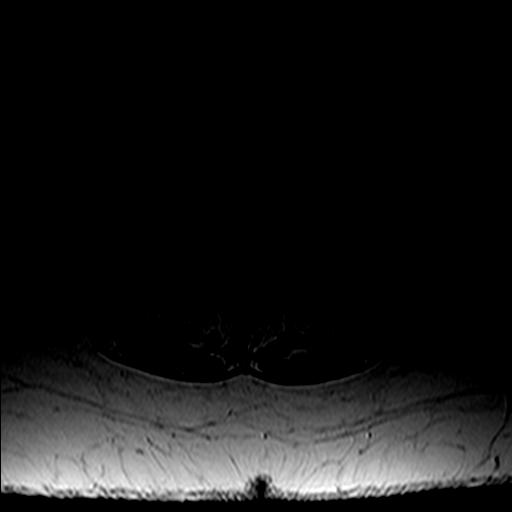
[im 20/39]
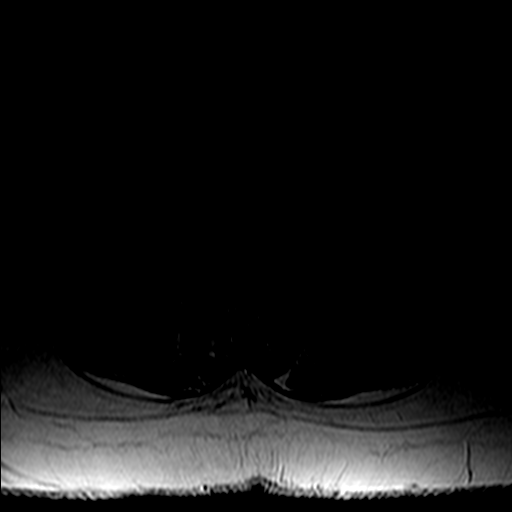
[im 33/39]
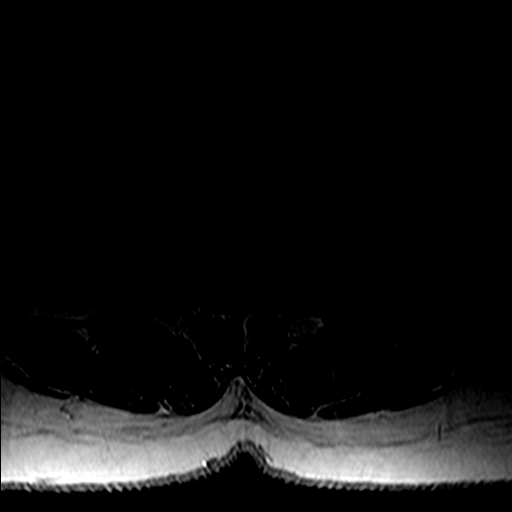

[14 of 48 positions shown; findings below may reference images not displayed]

FINDINGS: Segmentation:  The standard.

Alignment:  Mild convex left scoliosis noted.  No listhesis.

Vertebrae:  No fracture or worrisome lesion.

Conus medullaris and cauda equina: Conus extends to the L1-2 level.
Conus and cauda equina appear normal.

Paraspinal and other soft tissues: Negative.

Disc levels:

T11-12 is imaged in the sagittal plane only and negative.

T12-L1: Negative.

L1-2: Moderate facet arthropathy.  Otherwise negative.

L2-3: There is some ligamentum flavum thickening and moderate to
advanced facet degenerative disease, worse on the right. Mild
central canal narrowing is unchanged. The foramina are widely
patent.

L3-4: Ligamentum flavum thickening and moderate to advanced facet
degenerative disease appear unchanged. No disc bulge or protrusion.
Mild central canal narrowing is again seen. The foramina are open.

L4-5: Advanced facet arthropathy is slightly worse on the left.
There is some ligamentum flavum thickening and a shallow disc bulge.
Moderate central canal stenosis and narrowing in the right
subarticular recess which could impact the right L5 root appear
unchanged. The left foramen is open. Mild to moderate right
foraminal stenosis is unchanged.

L5-S1: Advanced bilateral facet degenerative disease. No disc bulge.
The central canal and foramina are widely patent.
IMPRESSION: No new abnormality since the comparison MRI.

Spondylosis most notable at L4-5 where there is moderate central
canal stenosis and narrowing in the right subarticular recess which
could impact the right L5 root. Moderate right foraminal narrowing
is also present at this level.

## 2019-09-01 ENCOUNTER — Encounter: Payer: Self-pay | Admitting: Family Medicine

## 2019-09-07 ENCOUNTER — Other Ambulatory Visit: Payer: Self-pay | Admitting: Family Medicine

## 2019-09-07 ENCOUNTER — Telehealth: Payer: Self-pay

## 2019-09-07 NOTE — Telephone Encounter (Signed)
Noted  

## 2019-09-07 NOTE — Telephone Encounter (Signed)
Copied from [place] [id number]. Topic: Referral - Status >> Sep 07, 2019  [id number] PM Simone Curia D wrote: [id number] Spoke with patient about dental clinic at [facility]. Patient stated she would call and make an appointment. Will follow-up in a few days. Ambrose Mantle [phone]

## 2019-09-12 ENCOUNTER — Other Ambulatory Visit: Payer: Self-pay | Admitting: Family Medicine

## 2019-09-12 DIAGNOSIS — R110 Nausea: Secondary | ICD-10-CM

## 2019-09-16 ENCOUNTER — Telehealth: Payer: Self-pay

## 2019-09-16 NOTE — Telephone Encounter (Signed)
09/16/2019 Follow-up call.  Spoke with patient.  She will call the [facility] Monday to schedule and appointment. The clinic requires the patient to call for an appointment.  Ambrose Mantle [phone]

## 2019-09-17 ENCOUNTER — Other Ambulatory Visit: Payer: Self-pay | Admitting: Family Medicine

## 2019-09-17 ENCOUNTER — Encounter: Payer: Self-pay | Admitting: Family Medicine

## 2019-09-17 DIAGNOSIS — F411 Generalized anxiety disorder: Secondary | ICD-10-CM

## 2019-09-19 ENCOUNTER — Other Ambulatory Visit: Payer: Self-pay

## 2019-09-19 DIAGNOSIS — F411 Generalized anxiety disorder: Secondary | ICD-10-CM

## 2019-09-19 MED ORDER — GABAPENTIN 600 MG PO TABS: 600.0000 mg | ORAL_TABLET | Freq: Two times a day (BID) | ORAL | 1 refills | Status: DC

## 2019-09-20 ENCOUNTER — Other Ambulatory Visit: Payer: Self-pay | Admitting: Family Medicine

## 2019-09-20 DIAGNOSIS — E785 Hyperlipidemia, unspecified: Secondary | ICD-10-CM

## 2019-09-26 ENCOUNTER — Encounter: Payer: Self-pay | Admitting: Obstetrics and Gynecology

## 2019-09-26 ENCOUNTER — Ambulatory Visit (INDEPENDENT_AMBULATORY_CARE_PROVIDER_SITE_OTHER): Payer: Medicare HMO

## 2019-09-26 ENCOUNTER — Ambulatory Visit (INDEPENDENT_AMBULATORY_CARE_PROVIDER_SITE_OTHER): Payer: Medicare HMO | Admitting: Obstetrics and Gynecology

## 2019-09-26 ENCOUNTER — Telehealth: Payer: Self-pay | Admitting: Obstetrics and Gynecology

## 2019-09-26 ENCOUNTER — Other Ambulatory Visit: Payer: Self-pay

## 2019-09-26 VITALS — BP 128/84 | Ht 66.0 in | Wt 227.0 lb

## 2019-09-26 DIAGNOSIS — N83202 Unspecified ovarian cyst, left side: Secondary | ICD-10-CM

## 2019-09-26 DIAGNOSIS — N83201 Unspecified ovarian cyst, right side: Secondary | ICD-10-CM | POA: Diagnosis not present

## 2019-09-26 NOTE — Progress Notes (Signed)
Gynecology Ultrasound Follow Up   Chief Complaint  Patient presents with  . Follow-up  Ultrasound for bilateral ovarian cysts   History of Present Illness: Patient is a 64 y.o. female who presents today for ultrasound evaluation of the above .  Ultrasound demonstrates the following findings Adnexa: stable appearing simple bilateral ovarian cysts  Uterus: absent Additional: no free fluid  She had a second opinion with gynecologic oncology (Dr. Theora Gianotti) who agreed that observation alone was prudent and reasonable.   Past Medical History:  Diagnosis Date  . Acute right-sided low back pain with right-sided sciatica 10/23/2017  . Anemia   . Bilateral renal cysts   . CAD (coronary artery disease) 09/28/2016  . Chest pain 10/14/2015  . Chronic back pain    "upper and lower" (09/19/2014)  . Chronic shoulder pain (Location of Primary Source of Pain) (Left) 01/29/2016  . Colitis 04/08/2016  . Coronary vasospasm ([place]) 12/09/2014  . Depression    "major" (09/19/2014)  . DVT (deep venous thrombosis) ([place])   . Esophageal spasm   . Essential hypertension 10/28/2013  . Gallstones   . Gastritis   . Gastroenteritis   . GERD (gastroesophageal reflux disease)   . Headache    "couple /month lately" (09/19/2014)  . Heart disease 12/09/2016  . High blood pressure   . High cholesterol   . History of blood transfusion 1985   related to hysterectomy  . History of nuclear stress test    Myoview 1/19:  EF 65, no ischemia or scar  . History of pulmonary embolus (PE) 09/19/2014  . Hx of cervical spine surgery 01/29/2016  . 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 Rule out Paget's disease of the bone with blood work.   . Hyperlipidemia 10/28/2013  . Hypertension   . IBS (irritable bowel syndrome)   . Lumbar central spinal stenosis (moderate at L4-5; mild at L2-3 and L3-4) 01/29/2016  . Migraine    "a few times/yr" (09/19/2014)  . Myocardial infarction ([place]) 10/2010 X 3   "while  hospitalized"  . Neck pain 01/29/2016  . Osteoarthritis    "qwhere; mostly around my joints" (09/19/2014)  . Pleural effusion, bilateral 07/31/2015  . Pneumonia 04/2014  . PTSD (post-traumatic stress disorder)   . Pulmonary embolism ([place]) 04/2001; 09/19/2014   "after gallbladder OR; "  . Schizoaffective disorder ([place])   . Schizoaffective disorder, depressive type ([place]) 08/28/2016  . Sleep apnea    "mild" (09/19/2014)  . Snoring    a. sleep study 5/16: No OSA  . SOB (shortness of breath) 07/31/2015  . Spinal stenosis   . Spondylosis   . Suicide attempt ([place]) 08/27/2016  . Type II diabetes mellitus ([place])    "dx'd in 2007; lost weight; no RX for ~ 4 yr now" (09/19/2014)  . Wheezing 02/25/2017    Past Surgical History:  Procedure Laterality Date  . ABDOMINAL HYSTERECTOMY  1985  . ANTERIOR CERVICAL DECOMP/DISCECTOMY FUSION  10/2012   in [place] C5-C7   . APPENDECTOMY  1985  . BACK SURGERY    . BREAST CYST EXCISION Right   . BREAST EXCISIONAL BIOPSY Right YRS AGO   NEG  . CARDIAC CATHETERIZATION   2000's; 2009  . CHOLECYSTECTOMY  2002  . COLONOSCOPY WITH PROPOFOL N/A 12/12/2016   Procedure: COLONOSCOPY WITH PROPOFOL;  Surgeon: [name], MD;  Location: [facility] ENDOSCOPY;  Service: Endoscopy;  Laterality: N/A;  . COLONOSCOPY WITH PROPOFOL N/A 02/17/2017   Procedure: COLONOSCOPY WITH PROPOFOL;  Surgeon: Vicente Males,  Bailey Mech, MD;  Location: [place];  Service: Gastroenterology;  Laterality: N/A;  . ESOPHAGOGASTRODUODENOSCOPY (EGD) WITH PROPOFOL N/A 02/17/2017   Procedure: ESOPHAGOGASTRODUODENOSCOPY (EGD) WITH PROPOFOL;  Surgeon: [name], MD;  Location: [facility] ENDOSCOPY;  Service: Gastroenterology;  Laterality: N/A;  . ESOPHAGOGASTRODUODENOSCOPY (EGD) WITH PROPOFOL N/A 03/07/2019   Procedure: ESOPHAGOGASTRODUODENOSCOPY (EGD) WITH PROPOFOL;  Surgeon: [name], MD;  Location: [facility]COPY;  Service: Gastroenterology;  Laterality: N/A;  . EXCISION/RELEASE BURSA HIP Right 12/1984  . HERNIA REPAIR   1985  . LEFT HEART CATHETERIZATION WITH CORONARY ANGIOGRAM N/A 12/09/2014   Procedure: LEFT HEART CATHETERIZATION WITH CORONARY ANGIOGRAM;  Surgeon: Burnell Blanks, MD;  Location: [facility] CATH LAB;  Service: Cardiovascular;  Laterality: N/A;  . TONSILLECTOMY AND ADENOIDECTOMY  ~ 1966-11[birth year]05  . TOTAL HIP ARTHROPLASTY Left 04-06-2014  . UMBILICAL HERNIA REPAIR  1985  . VENA CAVA FILTER PLACEMENT  03/2014    Family History  Problem Relation Age of Onset  . Stroke Mother        Deceased  . Lung cancer Father        Deceased  . Healthy Daughter   . Healthy Son        x 2  . Other Son        Suicide  . Cancer Brother   . Heart attack Neg Hx     Social History   Socioeconomic History  . Marital status: Divorced    Spouse name: Not on file  . Number of children: Not on file  . Years of education: Not on file  . Highest education level: Not on file  Occupational History  . Not on file  Tobacco Use  . Smoking status: Former Smoker    Packs/day: 1.50    Years: 10.00    Pack years: 15.00    Types: Cigarettes    Quit date: 04/12/1979    Years since quitting: 40.4  . Smokeless tobacco: Never Used  Substance and Sexual Activity  . Alcohol use: No    Alcohol/week: 0.0 standard drinks  . Drug use: No  . Sexual activity: Not Currently  Other Topics Concern  . Not on file  Social History Narrative   Lives with fiancee in an apartment on the first floor.  Has 3 grown children.  One child passed away.  On disability 11/05/94 - 2000, 2007 - current.     Education: some college.   Social Determinants of Health   Financial Resource Strain:   . Difficulty of Paying Living Expenses: Not on file  Food Insecurity:   . Worried About Charity fundraiser in the Last Year: Not on file  . Ran Out of Food in the Last Year: Not on file  Transportation Needs:   . Lack of Transportation (Medical): Not on file  . Lack of Transportation (Non-Medical): Not on file  Physical Activity:   . Days of  Exercise per Week: Not on file  . Minutes of Exercise per Session: Not on file  Stress:   . Feeling of Stress : Not on file  Social Connections:   . Frequency of Communication with Friends and Family: Not on file  . Frequency of Social Gatherings with Friends and Family: Not on file  . Attends Religious Services: Not on file  . Active Member of Clubs or Organizations: Not on file  . Attends Archivist Meetings: Not on file  . Marital Status: Not on file  Intimate Partner Violence:   . Fear of Current  or Ex-Partner: Not on file  . Emotionally Abused: Not on file  . Physically Abused: Not on file  . Sexually Abused: Not on file    Allergies  Allergen Reactions  . Abilify [Aripiprazole] Other (See Comments)    Jerking movement, slow motor skills  . Augmentin [Amoxicillin-Pot Clavulanate] Diarrhea and Nausea And Vomiting  . Bactrim [Sulfamethoxazole-Trimethoprim] Diarrhea  . Ceftin [Cefuroxime Axetil] Diarrhea  . Coconut Oil     Rash   . Diclofenac Other (See Comments)     Muscle Pain  . Dye Fdc Red [Red Dye]     "hair dye"  . Indocin [Indomethacin] Other (See Comments)    Migraines   . Linaclotide Diarrhea  . Lisinopril Diarrhea and Other (See Comments)    Other reaction(s): Dizziness, Vomiting  . Morphine Other (See Comments)    Chest Tightness  . Morphine And Related Other (See Comments)    Chest tightness   . Simvastatin Other (See Comments)    Muscle Pain  . Sulfa Antibiotics Diarrhea  . Sulfamethoxazole-Trimethoprim Diarrhea  . Wellbutrin [Bupropion] Other (See Comments)    Jerking movement Jerking movement Slurred speech  . Zetia [Ezetimibe]     Diarrhea   . Quetiapine Fumarate Palpitations  . Quetiapine Fumarate Palpitations    Prior to Admission medications   Medication Sig Start Date End Date Taking? Authorizing Provider  acetaminophen (TYLENOL) 500 MG tablet Take 1 tablet (500 mg total) by mouth every 6 (six) hours as needed. 09/29/18   Laban Emperor, PA-C  baclofen (LIORESAL) 10 MG tablet TAKE 1 TABLET BY MOUTH THREE TIMES A DAY AS NEEDED FOR MUSCLE SPASMS 09/08/19   [name], MD  blood glucose meter kit and supplies KIT Dispense based on patient and insurance preference. Use once daily. Dx code E11.9. 04/01/19   [name], MD  carvedilol (COREG) 3.125 MG tablet TAKE 1 TABLET (3.125 MG TOTAL) BY MOUTH 2 (TWO) TIMES DAILY WITH A MEAL. 08/20/19   Burnell Blanks, MD  cetirizine (ZYRTEC) 10 MG tablet Take 1 tablet (10 mg total) by mouth daily. 01/25/19   [name], MD  diclofenac sodium (VOLTAREN) 1 % GEL Apply 2 g topically 4 (four) times daily. Right arm/shoulder arthritis 04/08/18   McLean-Scocuzza, Nino Glow, MD  dicyclomine (BENTYL) 20 MG tablet TAKE 1 TABLET BY MOUTH THREE TIMES A DAY AS NEEDED FOR MUSCLE SPASMS 09/12/19   [name], MD  diphenhydrAMINE (BENADRYL) 25 MG tablet Take 1 tablet (25 mg total) by mouth every 6 (six) hours. 09/12/16   Jola Schmidt, MD  ELIQUIS 5 MG TABS tablet TAKE 1 TABLET BY MOUTH TWICE A DAY 04/18/19   [name], MD  escitalopram (LEXAPRO) 20 MG tablet Take 1 tablet (20 mg total) by mouth daily. 08/11/19   [name], MD  Evolocumab (REPATHA SURECLICK) 829 MG/ML SOAJ Inject 140 mg into the skin every 14 (fourteen) days. 06/17/19   [name], MD  famotidine (PEPCID) 20 MG tablet TAKE 1 TABLET BY MOUTH EVERYDAY AT BEDTIME 08/12/19   [name], MD  furosemide (LASIX) 20 MG tablet TAKE 1 TABLET (20 MG TOTAL) BY MOUTH DAILY AS NEEDED FOR EDEMA. 04/22/19   [name], MD  gabapentin (NEURONTIN) 600 MG tablet Take 1-1.5 tablets (600-900 mg total) by mouth 2 (two) times daily. Take 1 tablet in the AM and 1.5 tablet at bedtime 09/19/19   [name], MD  isosorbide mononitrate (IMDUR) 30 MG 24 hr  tablet TAKE 1.5 TABLETS (45 MG TOTAL) BY MOUTH DAILY. 06/29/19   Burnell Blanks, MD  JANUVIA 100 MG tablet TAKE 1 TABLET BY MOUTH  EVERY DAY 08/18/19   [name], MD  JARDIANCE 10 MG TABS tablet TAKE 1 TABLET BY MOUTH EVERY DAY 08/17/19   [name], MD  KLOR-CON M20 20 MEQ tablet TAKE 1 TABLET (20 MEQ TOTAL) BY MOUTH DAILY AS NEEDED (TAKE WITH LASIX). 04/22/19   [name], MD  levothyroxine (SYNTHROID) 25 MCG tablet TAKE 1 TABLET BY MOUTH BEFORE BREAKFAST 2-3 HOURS BEFORE TAKING ANY OTHER MEDICATION OR SUPPLEMENT 03/16/19   [name], MD  lidocaine (LIDODERM) 5 % Place 1 patch onto the skin daily. Remove & Discard patch within 12 hours. 10/06/18   [name], MD  Multiple Vitamins-Minerals (SUPER THERA VITE M) TABS Take by mouth.    [provider]  NIFEdipine (PROCARDIA) 10 MG capsule TAKE 1 CAPSULE BY MOUTH 2 TIMES DAILY. TAKE 30 MINS BEFORE MEALS UP TO 1X PER DAY 03/28/19   [name], MD  nitroGLYCERIN (NITROSTAT) 0.4 MG SL tablet PLACE 1 TABLET (0.4 MG TOTAL) UNDER THE TONGUE EVERY 5 (FIVE) MINUTES AS NEEDED FOR CHEST PAIN. 06/29/19 09/27/19  Burnell Blanks, MD  OLANZapine (ZYPREXA) 10 MG tablet TAKE 1 TABLET BY MOUTH EVERYDAY AT BEDTIME 08/11/19   [name], MD  ondansetron (ZOFRAN) 4 MG tablet TAKE 1 TABLET (4 MG TOTAL) BY MOUTH 2 (TWO) TIMES DAILY AS NEEDED FOR NAUSEA OR VOMITING. 09/12/19   [name], MD  pantoprazole (PROTONIX) 40 MG tablet TAKE 1 TABLET BY MOUTH EVERY DAY 05/03/19   [name], MD  penicillin v potassium (VEETID) 500 MG tablet Take 1 tablet (500 mg total) by mouth 4 (four) times daily. 08/12/19   [name], MD  pravastatin (PRAVACHOL) 10 MG tablet TAKE 1 TABLET BY MOUTH EVERYDAY AT BEDTIME 09/20/19   [name], MD    Physical Exam BP 128/84   Ht _0  (1.676 m)   Wt 227 lb (103 kg)   BMI 36.64 kg/m    General: NAD HEENT: normocephalic, anicteric Pulmonary: No increased work of breathing Extremities: no edema, erythema, or tenderness Neurologic: Grossly intact, normal gait Psychiatric:  mood appropriate, affect full  Imaging Results US PELVIS TRANSVAGINAL NON-OB (TV ONLY)  Result Date: 09/26/2019 Patient Name: Valerie Vazquez DOB: 1955/09/07 MRN: [id number] ULTRASOUND REPORT Location: [facility] Date of Service: 09/26/2019 Indications: evaluate for ovarian cysts Findings: The uterus and cervix are absent. Right Ovary measures 4.1 x 1.8 x 1.5 cm. It is normal in appearance. There is a small simple cyst in the right ovary measuring 1.9 x 1.7 x 1.7 cm (stable compared to prior in 06/2019: measurements were 2.23 x 2.05 x 1.78 cm). Left Ovary measures 3.9 x 2.2 x 2.3 cm. It is normal in appearance. There is a small simple cyst in the left ovary measuring 2.1 x 2.0 x 1.7 cm (stable compared to prior in 06/2019: measurements were 1.94 x 1.77 x 1.91 cm). Survey of the adnexa demonstrates no adnexal masses. There is no free fluid in the cul de sac. Impression: 1. The uterus and cervix are absent. 2. There is one simple cyst in each ovary. Gweneth Dimitri, RT The ultrasound images and findings were reviewed by me and I agree with the above report. Prentice Docker, MD, [facility], [facility] 09/26/2019 2:44 PM  Assessment: 64 y.o. [id number]  1. Cysts of both ovaries      Plan: Problem List Items Addressed This Visit      Endocrine   Cysts of both ovaries - Primary   Relevant Orders   US PELVIS TRANSVAGINAL NON-OB (TV ONLY)     Ultrasound today discussed with patient.  Ultrasound reveals stable bilateral ovarian cysts.  No concerning findings that are new.  Her cysts appear to be the same, she has reassuring tumor markers, she has had a consult with gynecologic oncology and during that consultation the gynecologic oncologist agreed that observation was the most prudent course of action.  Discussed with patient the interval of time for repeat ultrasound.  Mutual decision made to reimage in 6 months.  15 minutes spent in face to face discussion with > 50%  spent in counseling,management, and coordination of care of her cysts of both ovaries.   Prentice Docker, MD, [facility], [facility] 09/26/2019 2:58 PM

## 2019-09-26 NOTE — Telephone Encounter (Signed)
Patient will call back at a later date to get the 6 month US/follow up SDJ scheduled.

## 2019-10-04 ENCOUNTER — Other Ambulatory Visit: Payer: Self-pay | Admitting: Family Medicine

## 2019-10-13 ENCOUNTER — Other Ambulatory Visit: Payer: Self-pay | Admitting: Family Medicine

## 2019-10-16 ENCOUNTER — Other Ambulatory Visit: Payer: Self-pay | Admitting: Family Medicine

## 2019-10-19 ENCOUNTER — Other Ambulatory Visit: Payer: Self-pay | Admitting: Cardiovascular Disease

## 2019-10-19 ENCOUNTER — Ambulatory Visit
Admission: RE | Admit: 2019-10-19 | Discharge: 2019-10-19 | Disposition: A | Discharge status: Home / Self Care | Payer: Medicare HMO | Source: Ambulatory Visit | Attending: Family Medicine | Admitting: Family Medicine

## 2019-10-19 DIAGNOSIS — Z780 Asymptomatic menopausal state: Secondary | ICD-10-CM | POA: Insufficient documentation

## 2019-10-19 IMAGING — CR DG HIP (WITH OR WITHOUT PELVIS) 2-3V*L*
1 series · 3 of 3 positions shown · non-contrast
Comparison: 07/07/2015

CLINICAL DATA: Status post fall, left hip pain

EXAM:
DG HIP (WITH OR WITHOUT PELVIS) 2-3V LEFT

[Series 1: dg hip unilat w or w/o pelvis 2-3 views  · non-contrast · 0.14mm/px · 3 of 3 slices shown]
[im 1/3]
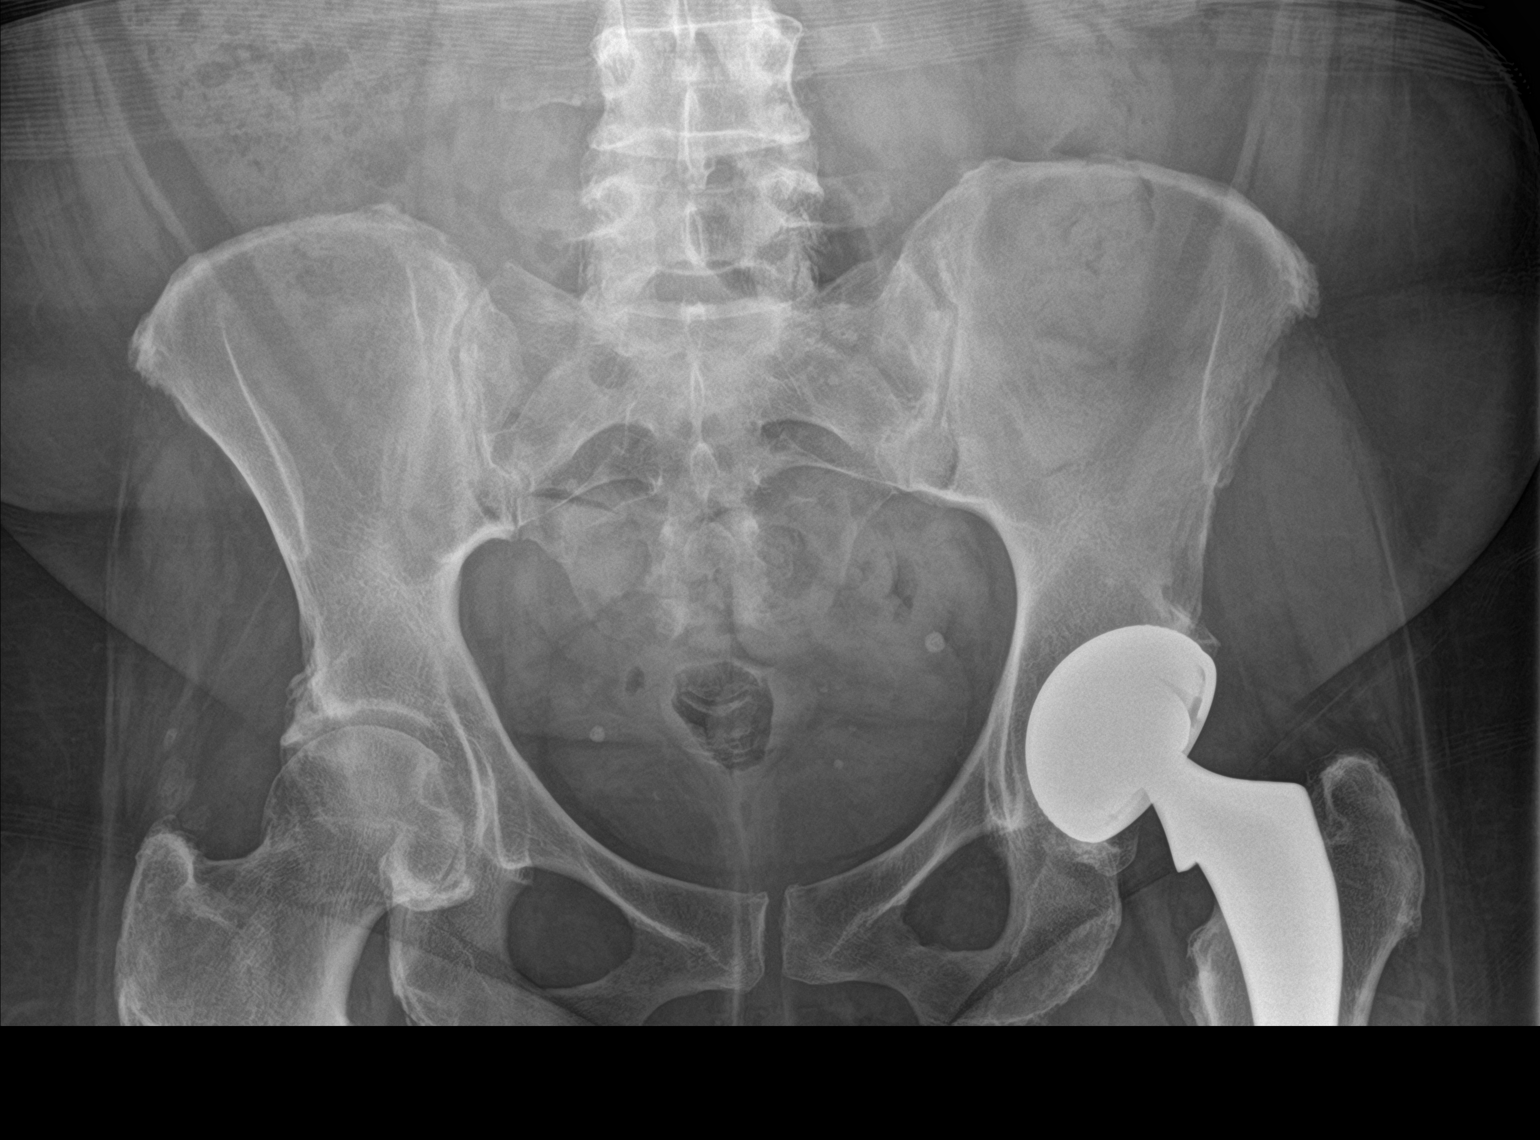
[im 2/3]
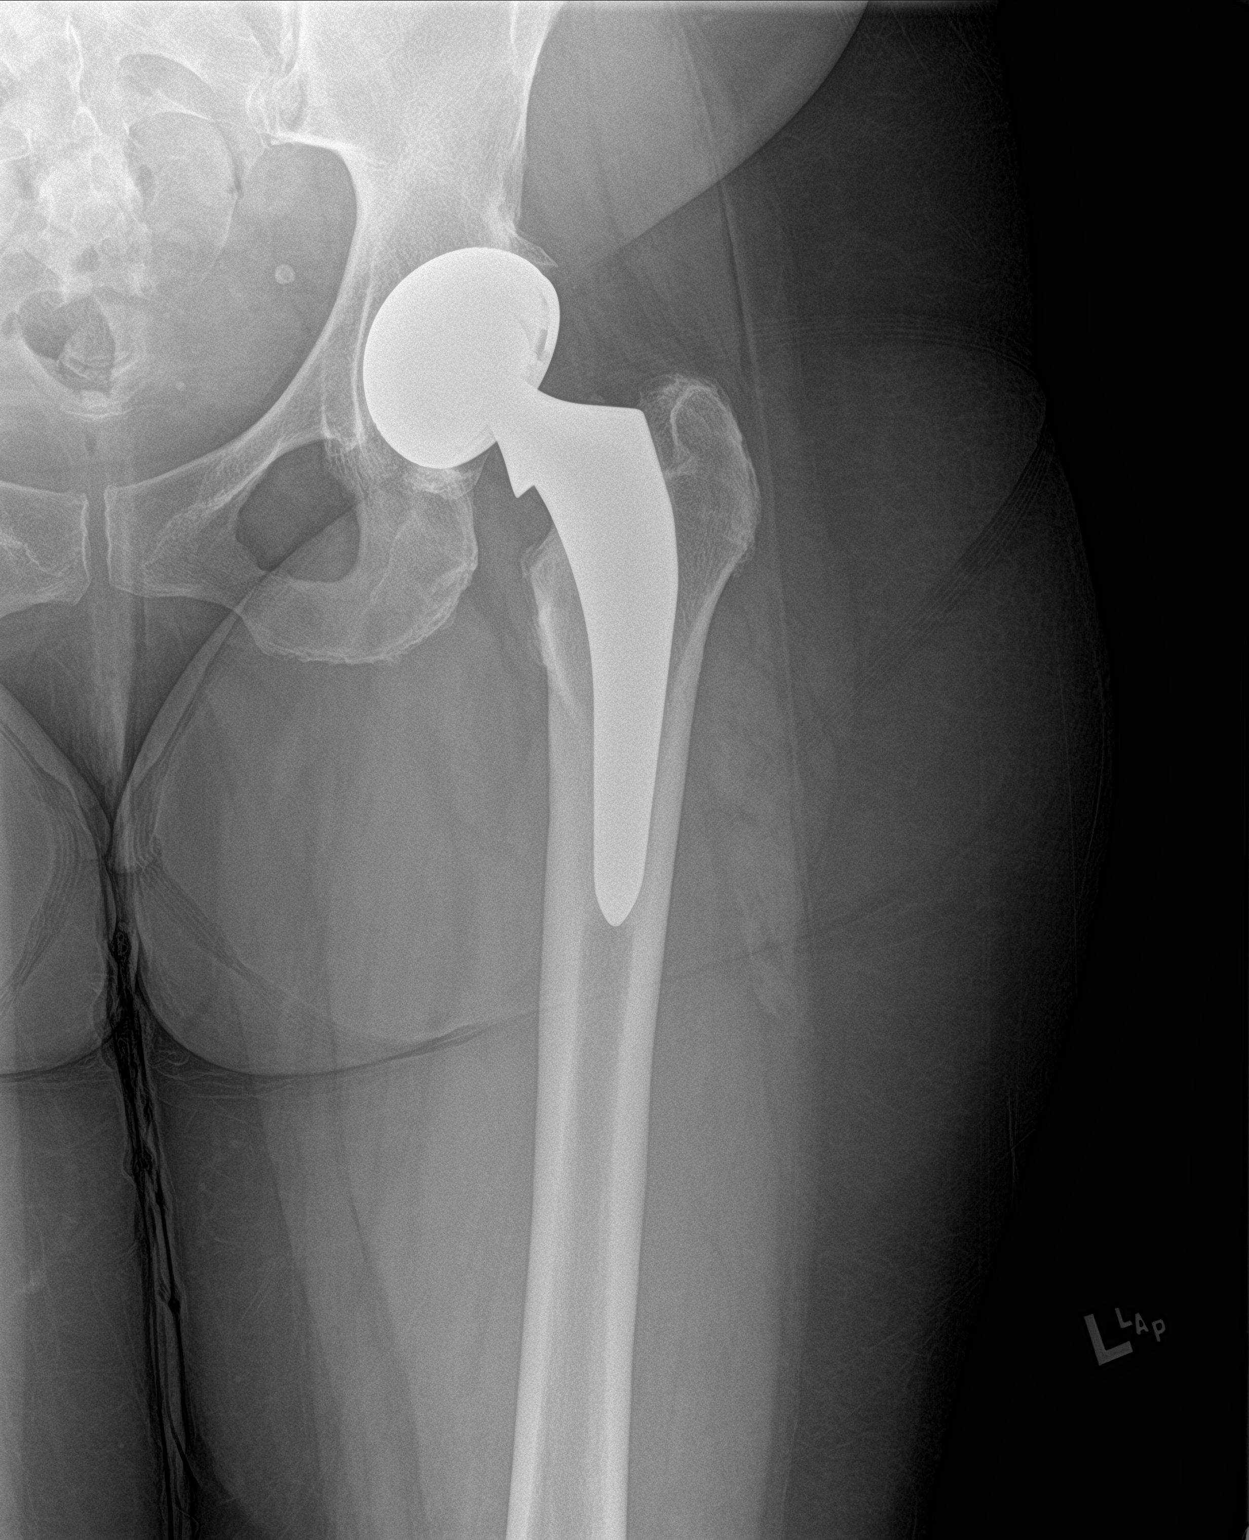
[im 3/3]
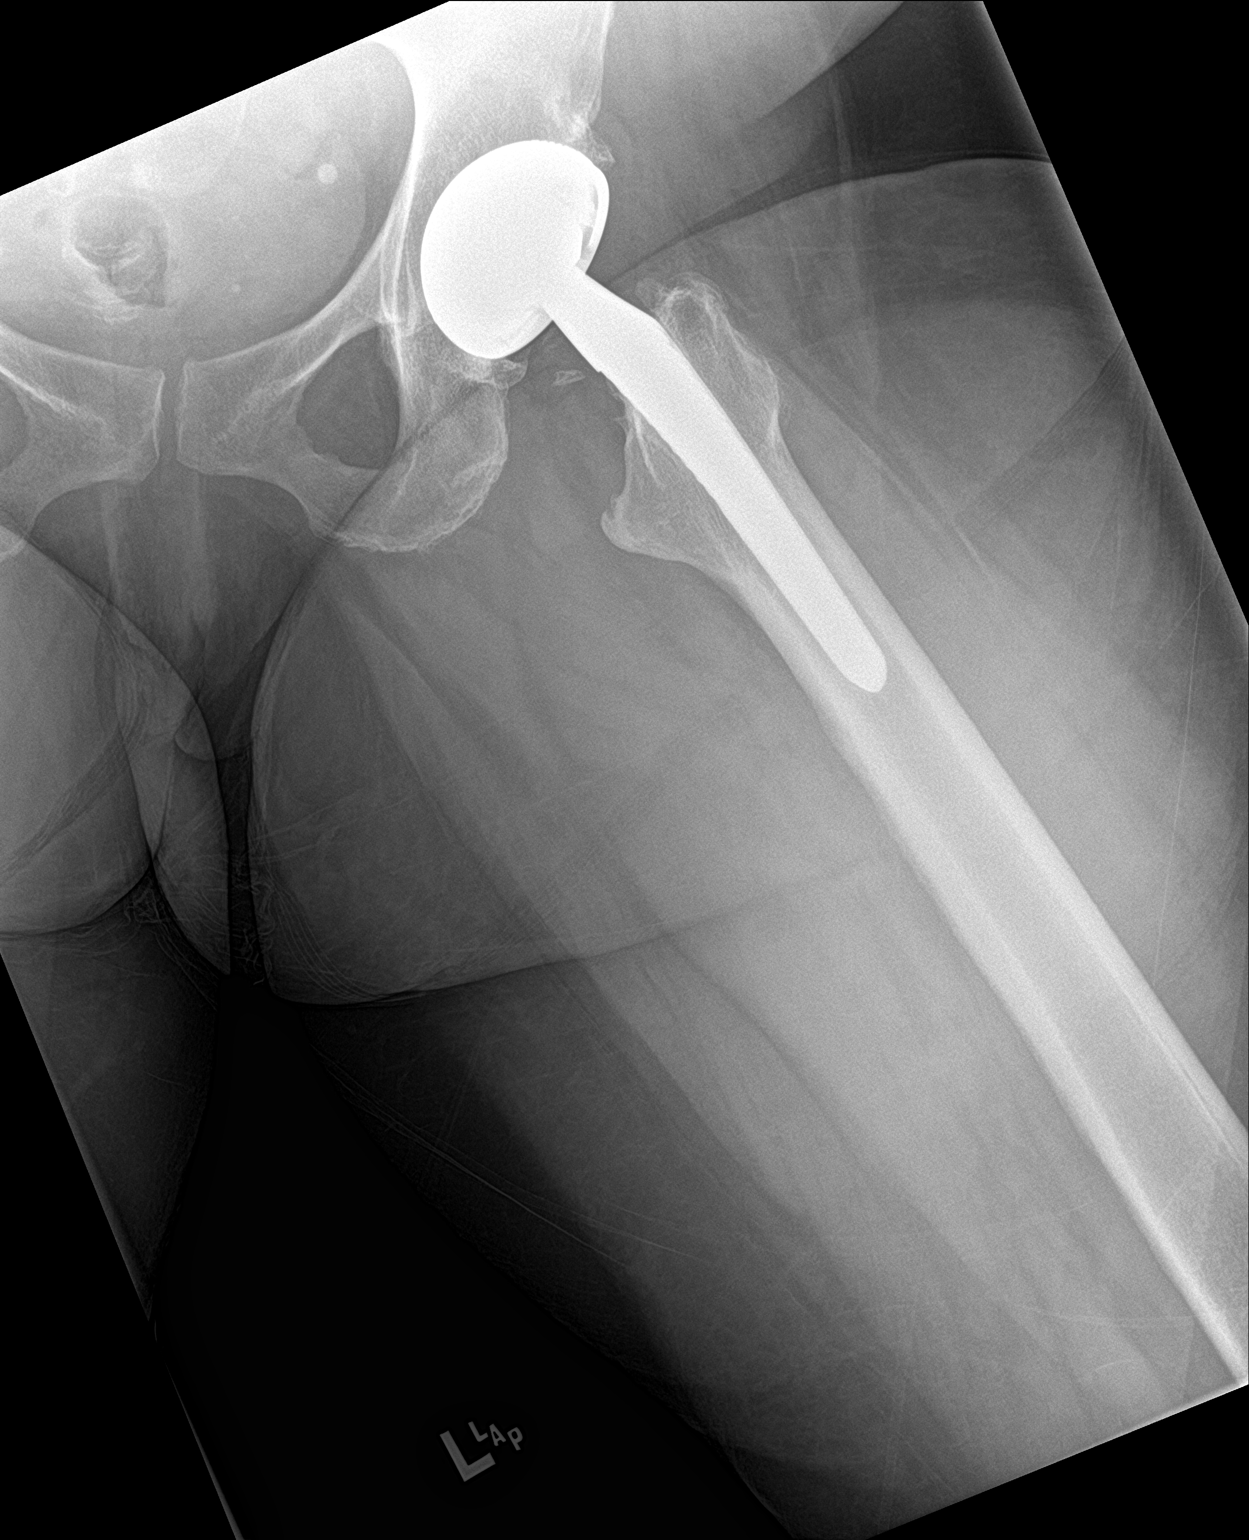

[3 of 3 positions shown; findings below may reference images not displayed]

FINDINGS: No acute fracture or dislocation. Left hip arthroplasty without
failure or complication. Mild osteoarthritis of the right hip. No
aggressive osseous lesion.
IMPRESSION: No acute osseous injury of the left hip.

## 2019-10-28 ENCOUNTER — Other Ambulatory Visit: Payer: Self-pay

## 2019-10-28 DIAGNOSIS — E119 Type 2 diabetes mellitus without complications: Secondary | ICD-10-CM | POA: Diagnosis not present

## 2019-10-28 LAB — HM DIABETES EYE EXAM

## 2019-10-29 ENCOUNTER — Other Ambulatory Visit: Payer: Self-pay | Admitting: Family Medicine

## 2019-10-29 DIAGNOSIS — Z760 Encounter for issue of repeat prescription: Secondary | ICD-10-CM

## 2019-10-31 ENCOUNTER — Encounter: Payer: Self-pay | Admitting: Nurse Practitioner

## 2019-10-31 ENCOUNTER — Telehealth: Payer: Medicare HMO | Admitting: Nurse Practitioner

## 2019-10-31 ENCOUNTER — Other Ambulatory Visit: Payer: Self-pay

## 2019-10-31 VITALS — Ht 66.0 in | Wt 222.0 lb

## 2019-10-31 DIAGNOSIS — M792 Neuralgia and neuritis, unspecified: Secondary | ICD-10-CM | POA: Diagnosis not present

## 2019-10-31 DIAGNOSIS — M25561 Pain in right knee: Secondary | ICD-10-CM | POA: Diagnosis not present

## 2019-11-01 NOTE — Progress Notes (Signed)
Virtual Visit via Video Note  I connected with@ on 10/31/19 by a video enabled telemedicine application and verified that I am speaking with the correct person using two identifiers. This visit type was conducted due to national recommendations for restrictions regarding the COVID-19 Pandemic (e.g. social distancing).  This format is felt to be most appropriate for this patient at this time.   I discussed the limitations of evaluation and management by telemedicine and the availability of in person appointments. The patient expressed understanding and agreed to proceed.  Only the patient and myself were on today's video visit. The patient was at home and I was in my office at the time of today's visit.   History of Present Illness:  This 64 yo patient is seen today for right arm pain and left hand cramps with known cervical disc disease. She is taking gabapentin 600 mg twice daily, baclofen 10 mg twice daily, acetaminophen 500 mg twice daily  for a long time.   " I need more surgery on my neck. " She had cervical neck disc decomp/discectomy fusion C5-C7 in [place] 10/2012. Patient recalls being told that she will eventually need more neck surgery. She has noted progressive hand cramps LT>RT over the last week. " My fingers draw up. " She also has intermittent  burning, sharp pain from ventral elbow to thumb on right arm. No weakness, numbness and reports full ROM. No discrete neck pain. No associated trauma, falls or any injury. No significant  pain on the left arm.  Right knee mild, dull pain mostly with walking: She has noticed a 1 week Hx of right knee mild discomfort at the external right knee. No falls or injuries. She is able to walk but sometimes feels like it might give out. No swelling. She attributes this to chronic osteoarthritis.   Past Medical History:  Diagnosis Date  . Acute right-sided low back pain with right-sided sciatica 10/23/2017  . Anemia   . Bilateral renal cysts   . CAD  (coronary artery disease) 09/28/2016  . Chest pain 10/14/2015  . Chronic back pain    "upper and lower" (09/19/2014)  . Chronic shoulder pain (Location of Primary Source of Pain) (Left) 01/29/2016  . Colitis 04/08/2016  . Coronary vasospasm ([place]) 12/09/2014  . Depression    "major" (09/19/2014)  . DVT (deep venous thrombosis) ([place])   . Esophageal spasm   . Essential hypertension 10/28/2013  . Gallstones   . Gastritis   . Gastroenteritis   . GERD (gastroesophageal reflux disease)   . Headache    "couple /month lately" (09/19/2014)  . Heart disease 12/09/2016  . High blood pressure   . High cholesterol   . History of blood transfusion 1985   related to hysterectomy  . History of nuclear stress test    Myoview 1/19:  EF 65, no ischemia or scar  . History of pulmonary embolus (PE) 09/19/2014  . Hx of cervical spine surgery 01/29/2016  . 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 Rule out Paget's disease of the bone with blood work.   . Hyperlipidemia 10/28/2013  . Hypertension   . IBS (irritable bowel syndrome)   . Lumbar central spinal stenosis (moderate at L4-5; mild at L2-3 and L3-4) 01/29/2016  . Migraine    "a few times/yr" (09/19/2014)  . Myocardial infarction ([place]) 10/2010 X 3   "while hospitalized"  . Neck pain 01/29/2016  . Osteoarthritis    "qwhere; mostly around my joints" (09/19/2014)  .  Pleural effusion, bilateral 07/31/2015  . Pneumonia 04/2014  . PTSD (post-traumatic stress disorder)   . Pulmonary embolism ([place]) 04/2001; 09/19/2014   "after gallbladder OR; "  . Schizoaffective disorder ([place])   . Schizoaffective disorder, depressive type ([place] ) 08/28/2016  . Sleep apnea    "mild" (09/19/2014)  . Snoring    a. sleep study 5/16: No OSA  . SOB (shortness of breath) 07/31/2015  . Spinal stenosis   . Spondylosis   . Suicide attempt ([place]) 08/27/2016  . Type II diabetes mellitus ([place])    "dx'd in 10/26/2005; lost weight; no RX for ~ 4 yr now" (09/19/2014)  . Wheezing  02/25/2017     Social History   Socioeconomic History  . Marital status: Divorced    Spouse name: Not on file  . Number of children: Not on file  . Years of education: Not on file  . Highest education level: Not on file  Occupational History  . Not on file  Tobacco Use  . Smoking status: Former Smoker    Packs/day: 1.50    Years: 10.00    Pack years: 15.00    Types: Cigarettes    Quit date: 04/12/1979    Years since quitting: 40.5  . Smokeless tobacco: Never Used  Substance and Sexual Activity  . Alcohol use: No    Alcohol/week: 0.0 standard drinks  . Drug use: No  . Sexual activity: Not Currently  Other Topics Concern  . Not on file  Social History Narrative   Lives with fiancee in an apartment on the first floor.  Has 3 grown children.  One child passed away.  On disability October 26, 1994 - 2000, 2007 - current.     Education: some college.   Social Determinants of Health   Financial Resource Strain:   . Difficulty of Paying Living Expenses: Not on file  Food Insecurity:   . Worried About Charity fundraiser in the Last Year: Not on file  . Ran Out of Food in the Last Year: Not on file  Transportation Needs:   . Lack of Transportation (Medical): Not on file  . Lack of Transportation (Non-Medical): Not on file  Physical Activity:   . Days of Exercise per Week: Not on file  . Minutes of Exercise per Session: Not on file  Stress:   . Feeling of Stress : Not on file  Social Connections:   . Frequency of Communication with Friends and Family: Not on file  . Frequency of Social Gatherings with Friends and Family: Not on file  . Attends Religious Services: Not on file  . Active Member of Clubs or Organizations: Not on file  . Attends Archivist Meetings: Not on file  . Marital Status: Not on file  Intimate Partner Violence:   . Fear of Current or Ex-Partner: Not on file  . Emotionally Abused: Not on file  . Physically Abused: Not on file  . Sexually Abused: Not on  file    Past Surgical History:  Procedure Laterality Date  . ABDOMINAL HYSTERECTOMY  1985  . ANTERIOR CERVICAL DECOMP/DISCECTOMY FUSION  10/2012   in [place] C5-C7   . APPENDECTOMY  1985  . BACK SURGERY    . BREAST CYST EXCISION Right   . BREAST EXCISIONAL BIOPSY Right YRS AGO   NEG  . CARDIAC CATHETERIZATION   2000's; Oct 27, 2007  . CHOLECYSTECTOMY  2000/10/26  . COLONOSCOPY WITH PROPOFOL N/A 12/12/2016   Procedure: COLONOSCOPY WITH PROPOFOL;  Surgeon: [name], MD;  Location: [facility] ENDOSCOPY;  Service: Endoscopy;  Laterality: N/A;  . COLONOSCOPY WITH PROPOFOL N/A 02/17/2017   Procedure: COLONOSCOPY WITH PROPOFOL;  Surgeon: [name], MD;  Location: [facility] ENDOSCOPY;  Service: Gastroenterology;  Laterality: N/A;  . ESOPHAGOGASTRODUODENOSCOPY (EGD) WITH PROPOFOL N/A 02/17/2017   Procedure: ESOPHAGOGASTRODUODENOSCOPY (EGD) WITH PROPOFOL;  Surgeon: [name], MD;  Location: [facility] ENDOSCOPY;  Service: Gastroenterology;  Laterality: N/A;  . ESOPHAGOGASTRODUODENOSCOPY (EGD) WITH PROPOFOL N/A 03/07/2019   Procedure: ESOPHAGOGASTRODUODENOSCOPY (EGD) WITH PROPOFOL;  Surgeon: [name], MD;  Location: [facility] ENDOSCOPY;  Service: Gastroenterology;  Laterality: N/A;  . EXCISION/RELEASE BURSA HIP Right 12/1984  . HERNIA REPAIR  1985  . LEFT HEART CATHETERIZATION WITH CORONARY ANGIOGRAM N/A 12/09/2014   Procedure: LEFT HEART CATHETERIZATION WITH CORONARY ANGIOGRAM;  Surgeon: Burnell Blanks, MD;  Location: [facility] CATH LAB;  Service: Cardiovascular;  Laterality: N/A;  . TONSILLECTOMY AND ADENOIDECTOMY  ~ 1968  . TOTAL HIP ARTHROPLASTY Left 04-06-2014  . UMBILICAL HERNIA REPAIR  1985  . VENA CAVA FILTER PLACEMENT  03/2014    Family History  Problem Relation Age of Onset  . Stroke Mother        Deceased  . Lung cancer Father        Deceased  . Healthy Daughter   . Healthy Son        x 2  . Other Son        Suicide  . Cancer Brother   . Heart attack Neg Hx     Allergies  Allergen Reactions  .  Abilify [Aripiprazole] Other (See Comments)    Jerking movement, slow motor skills  . Augmentin [Amoxicillin-Pot Clavulanate] Diarrhea and Nausea And Vomiting  . Bactrim [Sulfamethoxazole-Trimethoprim] Diarrhea  . Ceftin [Cefuroxime Axetil] Diarrhea  . Coconut Oil     Rash   . Diclofenac Other (See Comments)     Muscle Pain  . Dye Fdc Red [Red Dye]     "hair dye"  . Indocin [Indomethacin] Other (See Comments)    Migraines   . Linaclotide Diarrhea  . Lisinopril Diarrhea and Other (See Comments)    Other reaction(s): Dizziness, Vomiting  . Morphine Other (See Comments)    Chest Tightness  . Morphine And Related Other (See Comments)    Chest tightness   . Simvastatin Other (See Comments)    Muscle Pain  . Sulfa Antibiotics Diarrhea  . Sulfamethoxazole-Trimethoprim Diarrhea  . Wellbutrin [Bupropion] Other (See Comments)    Jerking movement Jerking movement Slurred speech  . Zetia [Ezetimibe]     Diarrhea   . Quetiapine Fumarate Palpitations  . Quetiapine Fumarate Palpitations    Current Outpatient Medications on File Prior to Visit  Medication Sig Dispense Refill  . acetaminophen (TYLENOL) 500 MG tablet Take 1 tablet (500 mg total) by mouth every 6 (six) hours as needed. 30 tablet 0  . baclofen (LIORESAL) 10 MG tablet TAKE 1 TABLET BY MOUTH THREE TIMES A DAY AS NEEDED FOR MUSCLE SPASMS 60 tablet 1  . blood glucose meter kit and supplies KIT Dispense based on patient and insurance preference. Use once daily. Dx code E11.9. 1 each 0  . carvedilol (COREG) 3.125 MG tablet TAKE 1 TABLET (3.125 MG TOTAL) BY MOUTH 2 (TWO) TIMES DAILY WITH A MEAL. 180 tablet 3  . cetirizine (ZYRTEC) 10 MG tablet Take 1 tablet (10 mg total) by mouth daily. 30 tablet 11  . dicyclomine (BENTYL) 20 MG tablet TAKE 1 TABLET BY MOUTH THREE TIMES A DAY AS NEEDED FOR MUSCLE SPASMS 270  tablet 0  . diphenhydrAMINE (BENADRYL) 25 MG tablet Take 1 tablet (25 mg total) by mouth every 6 (six) hours. 12 tablet 0  .  ELIQUIS 5 MG TABS tablet TAKE 1 TABLET BY MOUTH TWICE A DAY 60 tablet 5  . escitalopram (LEXAPRO) 20 MG tablet Take 1 tablet (20 mg total) by mouth daily. 90 tablet 1  . famotidine (PEPCID) 20 MG tablet TAKE 1 TABLET BY MOUTH EVERYDAY AT BEDTIME 90 tablet 0  . furosemide (LASIX) 20 MG tablet TAKE 1 TABLET (20 MG TOTAL) BY MOUTH DAILY AS NEEDED FOR EDEMA. 90 tablet 1  . gabapentin (NEURONTIN) 600 MG tablet Take 1-1.5 tablets (600-900 mg total) by mouth 2 (two) times daily. Take 1 tablet in the AM and 1.5 tablet at bedtime 225 tablet 1  . isosorbide mononitrate (IMDUR) 30 MG 24 hr tablet TAKE 1.5 TABLETS (45 MG TOTAL) BY MOUTH DAILY. 135 tablet 3  . JANUVIA 100 MG tablet TAKE 1 TABLET BY MOUTH EVERY DAY 90 tablet 1  . JARDIANCE 10 MG TABS tablet TAKE 1 TABLET BY MOUTH EVERY DAY 90 tablet 1  . KLOR-CON M20 20 MEQ tablet TAKE 1 TABLET (20 MEQ TOTAL) BY MOUTH DAILY AS NEEDED (TAKE WITH LASIX). 90 tablet 1  . levothyroxine (SYNTHROID) 25 MCG tablet TAKE 1 TABLET BY MOUTH BEFORE BREAKFAST 2-3 HOURS BEFORE TAKING ANY OTHER MEDICATION OR SUPPLEMENT 90 tablet 3  . lidocaine (LIDODERM) 5 % Place 1 patch onto the skin daily. Remove & Discard patch within 12 hours. 30 patch 0  . Multiple Vitamins-Minerals (SUPER THERA VITE M) TABS Take by mouth.    . nitroGLYCERIN (NITROSTAT) 0.4 MG SL tablet Place 1 tablet (0.4 mg total) under the tongue every 5 (five) minutes as needed for chest pain. 25 tablet 4  . OLANZapine (ZYPREXA) 10 MG tablet TAKE 1 TABLET BY MOUTH EVERYDAY AT BEDTIME 90 tablet 0  . ondansetron (ZOFRAN) 4 MG tablet TAKE 1 TABLET (4 MG TOTAL) BY MOUTH 2 (TWO) TIMES DAILY AS NEEDED FOR NAUSEA OR VOMITING. 30 tablet 0  . pantoprazole (PROTONIX) 40 MG tablet TAKE 1 TABLET BY MOUTH EVERY DAY 90 tablet 1  . diclofenac sodium (VOLTAREN) 1 % GEL Apply 2 g topically 4 (four) times daily. Right arm/shoulder arthritis 1 Tube 2  . Evolocumab (REPATHA SURECLICK) 226 MG/ML SOAJ Inject 140 mg into the skin every 14  (fourteen) days. 6 pen 2  . NIFEdipine (PROCARDIA) 10 MG capsule TAKE 1 CAPSULE BY MOUTH 2 TIMES DAILY. TAKE 30 MINS BEFORE MEALS UP TO 1X PER DAY 180 capsule 1  . penicillin v potassium (VEETID) 500 MG tablet Take 1 tablet (500 mg total) by mouth 4 (four) times daily. 40 tablet 0  . pravastatin (PRAVACHOL) 10 MG tablet TAKE 1 TABLET BY MOUTH EVERYDAY AT BEDTIME 90 tablet 1   No current facility-administered medications on file prior to visit.    Ht 5' 6" (1.676 m)   Wt 222 lb (100.7 kg)   BMI 35.83 kg/m   Observations/Objective: Gen: Awake, alert, no acute distress Resp: Breathing is even and non-labored Psych: calm/pleasant demeanor Neuro: Alert and Oriented x 3, + facial symmetry, speech is clear. MSK: She is able to move bilat upper extremities and make a fist  Assessment and Plan:  Known cervical disc Dx with past C5-C7 surgery presents via video visit with progressive, chronic  right arm pain and hands cramping- left>right. She is taking her medication as instructed. Plan to ref to neurosurgeon for urgent  in person evaluation and treatment.  Mild Right knee pain- onset one week, no injury. Suspect Osteoarthritis. Plan to treat conservatively and if it does not improve, she will need an in-office evaluation.      Follow Up Instructions:  I discussed the assessment and treatment plan with the patient. The patient was provided an opportunity to ask questions and all were answered. The patient agreed with the plan and demonstrated an understanding of the instructions.   The patient was advised to call back or seek an in-person evaluation if the symptoms worsen or if the condition fails to improve as anticipated.    Denice Paradise, NP

## 2019-11-02 ENCOUNTER — Other Ambulatory Visit: Payer: Self-pay | Admitting: Family Medicine

## 2019-11-02 DIAGNOSIS — R1013 Epigastric pain: Secondary | ICD-10-CM

## 2019-11-02 DIAGNOSIS — R110 Nausea: Secondary | ICD-10-CM

## 2019-11-04 ENCOUNTER — Encounter: Payer: Self-pay | Admitting: Family Medicine

## 2019-11-09 DIAGNOSIS — I110 Hypertensive heart disease with heart failure: Secondary | ICD-10-CM | POA: Diagnosis not present

## 2019-11-09 DIAGNOSIS — E1142 Type 2 diabetes mellitus with diabetic polyneuropathy: Secondary | ICD-10-CM | POA: Diagnosis not present

## 2019-11-09 DIAGNOSIS — E785 Hyperlipidemia, unspecified: Secondary | ICD-10-CM | POA: Diagnosis not present

## 2019-11-09 DIAGNOSIS — I509 Heart failure, unspecified: Secondary | ICD-10-CM | POA: Diagnosis not present

## 2019-11-09 DIAGNOSIS — I25119 Atherosclerotic heart disease of native coronary artery with unspecified angina pectoris: Secondary | ICD-10-CM | POA: Diagnosis not present

## 2019-11-09 DIAGNOSIS — E261 Secondary hyperaldosteronism: Secondary | ICD-10-CM | POA: Diagnosis not present

## 2019-11-09 DIAGNOSIS — E1151 Type 2 diabetes mellitus with diabetic peripheral angiopathy without gangrene: Secondary | ICD-10-CM | POA: Diagnosis not present

## 2019-11-09 DIAGNOSIS — E039 Hypothyroidism, unspecified: Secondary | ICD-10-CM | POA: Diagnosis not present

## 2019-11-09 DIAGNOSIS — R69 Illness, unspecified: Secondary | ICD-10-CM | POA: Diagnosis not present

## 2019-11-22 DIAGNOSIS — M4322 Fusion of spine, cervical region: Secondary | ICD-10-CM | POA: Diagnosis not present

## 2019-11-22 DIAGNOSIS — M5412 Radiculopathy, cervical region: Secondary | ICD-10-CM | POA: Diagnosis not present

## 2019-11-23 ENCOUNTER — Other Ambulatory Visit: Payer: Self-pay | Admitting: Student

## 2019-11-23 DIAGNOSIS — M5412 Radiculopathy, cervical region: Secondary | ICD-10-CM

## 2019-11-24 ENCOUNTER — Telehealth: Payer: Self-pay | Admitting: Family Medicine

## 2019-11-24 NOTE — Telephone Encounter (Signed)
Pt wanted a copy of letter stating her son is going to be her caregiver after her surgery. Printed a copy of letter and gave to Gae Bon to put in Dr. Ellen Henri basket to sign.

## 2019-11-24 NOTE — Telephone Encounter (Signed)
Patient needed a copy of the letter written 05/04/2020 and I put it in the sign basket for you to sign she will pick up tomorrow after 1 pm.  Gertha Lichtenberg,cma

## 2019-11-25 NOTE — Telephone Encounter (Signed)
I can certainly sign this, but why does she need this letter again? It was completed in September. Does she need a new letter or just a copy of the old letter?

## 2019-11-25 NOTE — Telephone Encounter (Signed)
Pt just called in and asked if it is possible to get a copy of the old letter but have Dr. Caryl Bis do a new letter for the housing authority. She is requesting call back.

## 2019-11-25 NOTE — Telephone Encounter (Signed)
I called the patient and she stated that her son is her caregiver and housing needs a letter stating that he stays with her because he is her caregiver.  Rose Hippler,cma

## 2019-11-26 NOTE — Telephone Encounter (Signed)
Can you find out why she needs a caregiver? Previously it was related to her having a surgery, though why does she need a caregiver at this time?

## 2019-11-28 NOTE — Telephone Encounter (Signed)
Pt called to check on letter status also wants to get the letter from last year

## 2019-11-28 NOTE — Telephone Encounter (Signed)
I called and left a message for the patient to call me back to ask why she needs a caregiver at this time.  Kamill Fulbright,cma

## 2019-11-28 NOTE — Telephone Encounter (Signed)
Patient returned office phone call from [facility], please call patient.

## 2019-11-28 NOTE — Telephone Encounter (Signed)
LVM for the patient to return a call about her letters.  Thandiwe Siragusa,cma

## 2019-11-29 ENCOUNTER — Telehealth: Payer: Self-pay | Admitting: Family Medicine

## 2019-11-29 NOTE — Telephone Encounter (Signed)
I called and LVM for the patient to call back to inform her that she will need a virtual visit for her abscess.  Ivin Rosenbloom,cma

## 2019-11-29 NOTE — Telephone Encounter (Signed)
Spoke to the patient and sent the message to the provider.  Peace Noyes,cma

## 2019-11-29 NOTE — Telephone Encounter (Signed)
Would she be willing to complete a virtual visit for this? She needs to have the dental work completed so that this issue does not keep recurring.

## 2019-11-29 NOTE — Telephone Encounter (Signed)
Pt called and states that she has an abscess near her tooth and would like to be prescribed penicillin. Please advise.

## 2019-11-29 NOTE — Telephone Encounter (Signed)
Patient called back she stated she needed a caregiver because he helps her around the house. Patient also states she has an abscess and called a dentist for treatment to schedule and the dentist stated they needed $170 for xrays and that is not included in the visit and she cannot afford to pay, she stated that she saw Philis Nettle for an abscess in 2020 and she prescribed her Penicillin and she wants to know if you would give her an antibiotic for the abcess.  Konnar Ben,cma

## 2019-12-01 ENCOUNTER — Telehealth: Payer: Self-pay | Admitting: Family Medicine

## 2019-12-01 NOTE — Telephone Encounter (Signed)
Pt called and wanted to know the status of letters she requested. She said she really needs to get them to the rental office.

## 2019-12-01 NOTE — Telephone Encounter (Signed)
Agree with need for evaluation. Please follow up with the patient to make sure she goes to get evaluated.

## 2019-12-01 NOTE — Telephone Encounter (Signed)
Transferred call to  Access Nurse - Pt called saying she is having SOB

## 2019-12-01 NOTE — Telephone Encounter (Signed)
Why does she need a care giver at this time? It looks like Valerie Vazquez called her to find this out but couldn't get in touch with her.

## 2019-12-01 NOTE — Telephone Encounter (Signed)
tient Name: Valerie Vazquez Gender: Female DOB: 1956-07-28 Age: 64 Y 80 M 30 D Return Phone Number: [phone] (Primary) Address: City/State/Zip: [place] [address] Client [place] Client Site [facility] - Day Physician [name] - MD Contact Type Call Who Is Calling Patient / Member / Family / Caregiver Call Type Triage / Clinical Relationship To Patient Self Return Phone Number [phone] (Primary) Chief Complaint BREATHING - shortness of breath or sounds breathless Reason for Call Symptomatic / Request for [place] from [facility] transferred the patient. The patient states she is short of breath and wheezing. Caller states she would like to be prescribed an inhaler. [place] or regional Translation No Nurse Assessment Nurse: Shook-Minyard, RN, Hannelore Date/Time (Eastern Time): 12/01/2019 3:08:13 PM Confirm and document reason for call. If symptomatic, describe symptoms. ---CAller states she is having wheezing when she walks. She gets short of breath. She wants an inhaler. It has been a number of years. She thinks it is albuterol. Has the patient had close contact with a person known or suspected to have the novel coronavirus illness OR traveled / lives in area with major community spread (including international travel) in the last 14 days from the onset of symptoms? * If Asymptomatic, screen for exposure and travel within the last 14 days. ---No Does the patient have any new or worsening symptoms? ---Yes Will a triage be completed? ---Yes Related visit to physician within the last 2 weeks? ---No Does the PT have any chronic conditions? (i.e. diabetes, asthma, this includes High risk factors for pregnancy, etc.) ---Yes List chronic conditions. ---copd, diabetes, htn (isosorbide, carvidilol) Took her  off nifedipine because it lowered her bp too much. Diastolic heart failure, is on furosemide. Is this a behavioral health or substance abuse call? ---NoPLEASE NOTE: All timestamps contained within this report are represented as [place] Standard Time. CONFIDENTIALTY NOTICE: This fax transmission is intended only for the addressee. It contains information that is legally privileged, confidential or otherwise protected from use or disclosure. If you are not the intended recipient, you are strictly prohibited from reviewing, disclosing, copying using or disseminating any of this information or taking any action in reliance on or regarding this information. If you have received this fax in error, please notify us immediately by telephone so that we can arrange for its return to [place]. Phone: [phone], Toll-Free: [phone], Fax: [phone] Page: 2 of 2 Call Id: [id number] Guidelines Guideline Title Affirmed Question Affirmed Notes Nurse Date/Time Eilene Ghazi Time) Breathing Difficulty [1] MODERATE difficulty breathing (e.g., speaks in phrases, SOB even at rest, pulse 100-120) AND [2] NEWonset or WORSE than normal Shook-Minyard, RN, Hannelore 12/01/2019 3:11:52 PM Disp. Time Eilene Ghazi Time) Disposition Final User 12/01/2019 3:03:09 PM Send to Urgent Queue [place] 12/01/2019 3:15:16 PM Go to ED Now Yes Shook-Minyard, RN, Hannelore Caller Disagree/Comply Comply Caller Understands Yes PreDisposition InappropriateToAsk Care Advice Given Per Guideline GO TO ED NOW: CARE ADVICE given per Breathing Difficulty (Adult) guideline. * Call EMS if you become worse. CALL EMS 911 IF: Referrals GO TO FACILITY OTHER - SPECIFY

## 2019-12-02 ENCOUNTER — Other Ambulatory Visit: Payer: Self-pay | Admitting: Family Medicine

## 2019-12-05 ENCOUNTER — Other Ambulatory Visit: Payer: Self-pay | Admitting: Family Medicine

## 2019-12-05 NOTE — Telephone Encounter (Signed)
Paitent stated her son helps her around the house and she needs him there.  Kinberly Perris,cma

## 2019-12-05 NOTE — Telephone Encounter (Signed)
Sorry, can you confirm what medical issues she needs a care giver for? Is there a specific medical problem that keeps her from fully caring for herself?

## 2019-12-06 NOTE — Telephone Encounter (Signed)
Patient needs her son for cooking and cleaning.

## 2019-12-06 NOTE — Telephone Encounter (Signed)
Patient needs her son for cooking and cleaning.  Jarvis Knodel,cma

## 2019-12-07 ENCOUNTER — Telehealth: Payer: Self-pay | Admitting: Family Medicine

## 2019-12-07 NOTE — Telephone Encounter (Signed)
Pt called back and said that she saw a NP at [facility] for Neck pain and has an MRI scheduled for Friday. Pt wanted to know if Dr. Caryl Bis would write the letter for her since she is now having this pain to housing. Pt said that the NP at [facility] told her that Primary Care had to write this letter she was unable to do it. Pt is requesting an appt for neck pain and to discuss the letter with Dr.Sonnenberg

## 2019-12-07 NOTE — Telephone Encounter (Signed)
The patient does not have a medical reason for a care giver and thus I can not write a new letter stating this. You can provide her the letter from September if she needs that to show to the housing authority for the time period in September after her surgery, though at this time she has no medical reason for a caregiver.

## 2019-12-07 NOTE — Telephone Encounter (Signed)
The provider stated he will not write a new letter with her son as a caregiver, We asked her why she needed a caregiver and she stated he cooks and cleans for her and the provider stated there is no medical reason for the patient to have a caregiver, she can have the letter from last September but he is not writing her a new letter because she has no medical reason for needing a caregiver at this time. Having neck pain does not constitute a reason for a caregiver in the home.    Trenae Brunke,cma

## 2019-12-07 NOTE — Telephone Encounter (Signed)
I spoke to [place] and she informed the patient that the letter could be written by the NP and now the patient is stating that she saw the NP weeks ago, I informed her to let the patient know per the provider he will not write a letter and lie about her son being a caregiver, she could have the letter from last September and it is at the front for her to pick up.  Daren Yeagle,cma

## 2019-12-07 NOTE — Telephone Encounter (Addendum)
[place] called back and Valerie Vazquez informed her of this message and the letter from September 2020 was placed at the front for the patient to pick up.  [name],cma .  [name],cma

## 2019-12-07 NOTE — Telephone Encounter (Signed)
Pt states that she has neck pain and is scheduled to get a MRI Friday. Pt asked if this would get her a new letter for [place] housing? Please advise.

## 2019-12-09 ENCOUNTER — Ambulatory Visit
Admission: RE | Admit: 2019-12-09 | Discharge: 2019-12-09 | Disposition: A | Discharge status: Home / Self Care | Payer: Medicare HMO | Source: Ambulatory Visit | Attending: Student | Admitting: Student

## 2019-12-09 ENCOUNTER — Other Ambulatory Visit: Payer: Self-pay

## 2019-12-09 DIAGNOSIS — M5412 Radiculopathy, cervical region: Secondary | ICD-10-CM | POA: Insufficient documentation

## 2019-12-09 DIAGNOSIS — M542 Cervicalgia: Secondary | ICD-10-CM | POA: Diagnosis not present

## 2019-12-10 ENCOUNTER — Encounter: Payer: Self-pay | Admitting: Family Medicine

## 2019-12-19 ENCOUNTER — Other Ambulatory Visit: Payer: Self-pay | Admitting: *Deleted

## 2019-12-19 ENCOUNTER — Encounter: Payer: Self-pay | Admitting: *Deleted

## 2019-12-19 NOTE — Patient Outreach (Signed)
[place] [facility]) Care Management [place] Telephone Outreach, Nurse Advice line follow up call  12/19/2019  ARLISHA CERBONE Apr 23, 1956 [id number]  Successful telephone outreach for screening to Paulita Fujita, 64 y/o female, referred to [facility] RN CM by [facility] CMA this morning, after notification received of call to [facility] 24-hour nurse advice line; apparently, patient contacted the nurse advice line over the weekend to inquire about chronic pain associated with neck spasm and cramping; patient reported at time of call that she was also having spasms and tingling in her (L) hand and forearm.  Patient has history including, but not limited to, chronic neck and shoulder pain; HTN/ HLD; IBS/ GERD; DM; dCHF; schizoaffective disorder; and DM.  Patient has had no recent ED/ hospital visits.  HIPAA/ identity verified with patient during phone call today; patient acknowledges her call to the 24-hour nurse advice line; stated she called "just to reassure" herself; described ongoing chronic neck pain, stated, "this has been going on for awhile now;" and states that she "just hung up from" calling her neurosurgical provider; stated she was calling to discuss treatment plan with provider, as she is not sure that she wishes to receive injections to manage her chronic pain; states, "I'd rather just have surgery."  Encouraged patient to continue working with neurosurgery provider to determine optimum plan of care to address her chronic pain.  Patient declines quantifying pain, stating, "I am always in pain- it's nothing new."  She sounds to be in no distress throughout call today.  Patient further reports: -- no acute clinical concerns, "this has been going on for quite awhile" per patient report; denies need to seek urgent/ emergent care; "I'm fine; they just need to decide what they are going to do to treat the pain." -- no medication concerns; self manages medications; declines medication review today,  stating no concerns with medications -- denies community resource needs; independent in ADL/ iADL's; lives with sone, who is able to assist if indicated.   -- SDOH completed for: depression/ transportation/ and food insecurity; no concerns/ needs identified; patient drives self to most appointments  -- no Advanced Directives for Living Will/ HCPOA in place; basics of creating same discussed with patient -- no new/ recent falls; uses rollator for ambulation -- visits PCP "on a regular basis," reports has scheduled office visit with PCP "next month"  Patient agreed to complete screening call; this was completed and no care management/ coordination needs were identified during phone call; patient declines ongoing [facility] CM follow up stating no needs; however, I will place printed educational material around Advanced Directive planning, along with planning packet in mail to patient.  Discussed with patient that [facility] CM is available to her as a benefit of her medicare plan and encouraged her to contact [facility] CM should needs arise in the future; she is agreeable to this.  Plan:  Will place successful outreach letter in mail to patient and include printed educational material and planning packet for Advanced Directives  Oneta Rack, RN, BSN, [facility]  [phone]

## 2019-12-20 ENCOUNTER — Telehealth: Payer: Self-pay

## 2019-12-20 MED ORDER — GLUCOSE BLOOD VI STRP: ORAL_STRIP | 12 refills | Status: DC

## 2019-12-20 NOTE — Telephone Encounter (Signed)
I do not know of any disabling condition that she has. If she feels she has a disabling condition then I would be happy to refer her to the appropriate specialist to help determine if she is truly disabled as I do not determine disability.

## 2019-12-20 NOTE — Telephone Encounter (Signed)
I called the patient and explained that the form was asking if she is disabled and she is not the provider stated he could referr her to a specialist to determine disability but he does not deal with disability and the patient understood.  I informed her that we would not be filling out the form or sending the form back to [employer]. She understood and was okay with that.  Corbet Hanley,cma

## 2019-12-20 NOTE — Telephone Encounter (Signed)
Sent to pharmacy 

## 2019-12-22 ENCOUNTER — Other Ambulatory Visit: Payer: Self-pay | Admitting: Family Medicine

## 2019-12-22 DIAGNOSIS — R110 Nausea: Secondary | ICD-10-CM

## 2019-12-29 ENCOUNTER — Other Ambulatory Visit: Payer: Self-pay | Admitting: *Deleted

## 2019-12-29 NOTE — Patient Outreach (Signed)
[place] [facility] [facility]) Care Management  12/29/2019  Valerie Vazquez 1956/01/12 [id number]   Unsuccessful outreach attempt made to patient for screening. RN Health Coach left HIPAA compliant voicemail message along with her contact information.  Plan: RN Health Coach will send patient unsuccessful letter and will call patient within the next several business days   [name] RN, [place] [phone] [email or web address]

## 2020-01-02 ENCOUNTER — Other Ambulatory Visit: Payer: Self-pay | Admitting: *Deleted

## 2020-01-02 NOTE — Patient Outreach (Signed)
[place] [facility]) Care Management  01/02/2020  Valerie Vazquez 1955-10-19 [id number]   Unsuccessful outreach attempt made to patient for screening # 2. RN Health Coach left HIPAA compliant voicemail message along with her contact information.  Plan: RN Health Coach will call patient within the next several business days.   [name] RN, BSN [place] [phone] [name]

## 2020-01-07 ENCOUNTER — Other Ambulatory Visit: Payer: Self-pay | Admitting: Family Medicine

## 2020-01-07 DIAGNOSIS — F250 Schizoaffective disorder, bipolar type: Secondary | ICD-10-CM

## 2020-01-10 ENCOUNTER — Other Ambulatory Visit: Payer: Self-pay | Admitting: *Deleted

## 2020-01-10 NOTE — Patient Outreach (Signed)
[place] [facility]) Care Management  01/10/2020  Valerie Vazquez [birth year] [id number]  Unsuccessful outreach attempt made to patient for screening. RN Health Coach left HIPAA compliant voicemail message along with her contact information.  Plan: RN Health Coach will close case if no return call from patient within 10 day time period from Unsuccessful Letter being mailed to patient.  [name] RN, BSN [place] [phone] [email or web address]

## 2020-01-11 ENCOUNTER — Other Ambulatory Visit: Payer: Self-pay | Admitting: *Deleted

## 2020-01-11 NOTE — Patient Outreach (Addendum)
[place] [facility]) Care Management  01/11/2020  Valerie Vazquez 11/25/1955 [id number]  Multiple attempts to establish contact with patient without success. No response from letter mailed to patient. Case is being closed at this time.   Plan: RN Health Coach will close case.  [name] RN, BSN [place] [phone] [name]

## 2020-01-24 ENCOUNTER — Other Ambulatory Visit: Payer: Self-pay | Admitting: Family Medicine

## 2020-01-24 DIAGNOSIS — R1013 Epigastric pain: Secondary | ICD-10-CM

## 2020-01-27 ENCOUNTER — Encounter: Payer: Self-pay | Admitting: Family Medicine

## 2020-01-27 ENCOUNTER — Ambulatory Visit (INDEPENDENT_AMBULATORY_CARE_PROVIDER_SITE_OTHER): Payer: Medicare HMO

## 2020-01-27 ENCOUNTER — Ambulatory Visit (INDEPENDENT_AMBULATORY_CARE_PROVIDER_SITE_OTHER): Payer: Medicare HMO | Admitting: Family Medicine

## 2020-01-27 ENCOUNTER — Other Ambulatory Visit: Payer: Self-pay

## 2020-01-27 VITALS — Ht 66.0 in | Wt 229.2 lb

## 2020-01-27 DIAGNOSIS — F329 Major depressive disorder, single episode, unspecified: Secondary | ICD-10-CM

## 2020-01-27 DIAGNOSIS — E119 Type 2 diabetes mellitus without complications: Secondary | ICD-10-CM | POA: Diagnosis not present

## 2020-01-27 DIAGNOSIS — F419 Anxiety disorder, unspecified: Secondary | ICD-10-CM

## 2020-01-27 DIAGNOSIS — Z0000 Encounter for general adult medical examination without abnormal findings: Secondary | ICD-10-CM | POA: Diagnosis not present

## 2020-01-27 DIAGNOSIS — L600 Ingrowing nail: Secondary | ICD-10-CM

## 2020-01-27 DIAGNOSIS — K582 Mixed irritable bowel syndrome: Secondary | ICD-10-CM

## 2020-01-27 DIAGNOSIS — I5032 Chronic diastolic (congestive) heart failure: Secondary | ICD-10-CM

## 2020-01-27 DIAGNOSIS — E785 Hyperlipidemia, unspecified: Secondary | ICD-10-CM | POA: Diagnosis not present

## 2020-01-27 DIAGNOSIS — R69 Illness, unspecified: Secondary | ICD-10-CM | POA: Diagnosis not present

## 2020-01-27 DIAGNOSIS — R110 Nausea: Secondary | ICD-10-CM

## 2020-01-27 DIAGNOSIS — F32A Depression, unspecified: Secondary | ICD-10-CM

## 2020-01-27 LAB — COMPREHENSIVE METABOLIC PANEL
ALT: 16 U/L (ref 0–35)
AST: 16 U/L (ref 0–37)
Albumin: 3.9 g/dL (ref 3.5–5.2)
Alkaline Phosphatase: 116 U/L (ref 39–117)
BUN: 13 mg/dL (ref 6–23)
CO2: 27 mEq/L (ref 19–32)
Calcium: 9 mg/dL (ref 8.4–10.5)
Chloride: 104 mEq/L (ref 96–112)
Creatinine, Ser: 0.84 mg/dL (ref 0.40–1.20)
GFR: 82.13 mL/min (ref >60.00)
Glucose, Bld: 159 mg/dL — ABNORMAL HIGH (ref 70–99)
Potassium: 4 mEq/L (ref 3.5–5.1)
Sodium: 140 mEq/L (ref 135–145)
Total Bilirubin: 0.5 mg/dL (ref 0.2–1.2)
Total Protein: 6.9 g/dL (ref 6.0–8.3)

## 2020-01-27 LAB — LIPID PANEL
Cholesterol: 265 mg/dL — ABNORMAL HIGH (ref 0–200)
HDL: 42.6 mg/dL (ref >39.00)
LDL Cholesterol: 199 mg/dL — ABNORMAL HIGH (ref 0–99)
NonHDL: 222.64
Total CHOL/HDL Ratio: 6
Triglycerides: 120 mg/dL (ref 0.0–149.0)
VLDL: 24 mg/dL (ref 0.0–40.0)

## 2020-01-27 LAB — CBC
HCT: 39.2 % (ref 36.0–46.0)
Hemoglobin: 13 g/dL (ref 12.0–15.0)
MCHC: 33.2 g/dL (ref 30.0–36.0)
MCV: 94 fl (ref 78.0–100.0)
Platelets: 324 10*3/uL (ref 150.0–400.0)
RBC: 4.17 Mil/uL (ref 3.87–5.11)
RDW: 13.9 % (ref 11.5–15.5)
WBC: 7.6 10*3/uL (ref 4.0–10.5)

## 2020-01-27 LAB — MICROALBUMIN / CREATININE URINE RATIO
Creatinine,U: 104.6 mg/dL
Microalb Creat Ratio: 0.7 mg/g (ref 0.0–30.0)
Microalb, Ur: <0.7 mg/dL (ref 0.0–1.9)

## 2020-01-27 LAB — HEMOGLOBIN A1C: Hgb A1c MFr Bld: 7.4 % — ABNORMAL HIGH (ref 4.6–6.5)

## 2020-01-27 MED ORDER — ONDANSETRON HCL 4 MG PO TABS: 4.0000 mg | ORAL_TABLET | Freq: Two times a day (BID) | ORAL | 0 refills | Status: DC | PRN

## 2020-01-27 NOTE — Progress Notes (Signed)
[name], MD Phone: [phone]  Valerie Vazquez is a 64 y.o. female who presents today for f/u.  HYPERLIPIDEMIA Symptoms Chest pain on exertion: No    Medications: Compliance-reports she is taking Crestor. Right upper quadrant pain-no muscle aches-no.  DIABETES Disease Monitoring: Blood Sugar ranges-not checking. Polyuria/phagia/dipsia-no.      Optho-up-to-date. Medications: Compliance-taking Januvia, Jardiance hypoglycemic symptoms-no.  Anxiety/depression: on zyprexa.  She has not established with a psychiatrist yet.  Denies depression.  Right great toe pain: Patient feels that she has an ingrown toenail.  Its been hurting her for about a month.  Is on the fibular aspect of her great toe.  She is been using peroxide on it.  IBS/chronic nausea: Taking Zofran once a day.  Also taking dicyclomine.  Sometimes does not have an appetite.  She does report a prior history of an EGD in 2020.  This appeared normal.  Diastolic heart failure: Patient reports occasional discomfort in her chest and shortness of breath when she is at rest.  She notes this is related to fluid accumulation.  She takes Lasix and it resolves on its own.  Notes this is relatively stable.  She does follow with cardiology.  Social History   Tobacco Use  Smoking Status Former Smoker  . Packs/day: 1.50  . Years: 10.00  . Pack years: 15.00  . Types: Cigarettes  . Quit date: 04/12/1979  . Years since quitting: 40.8  Smokeless Tobacco Never Used     ROS see history of present illness  Objective  Physical Exam Vitals:   01/27/20 1116  BP: 130/70  Pulse: 73  Temp: (!) 97.2 F (36.2 C)  SpO2: 97%    BP Readings from Last 3 Encounters:  01/27/20 130/70  09/26/19 128/84  06/15/19 97/70   Wt Readings from Last 3 Encounters:  01/27/20 229 lb 3.2 oz (104 kg)  01/27/20 229 lb 3.2 oz (104 kg)  10/31/19 222 lb (100.7 kg)    Physical Exam Constitutional:      General: She is not in acute  distress.    Appearance: She is not diaphoretic.  Cardiovascular:     Rate and Rhythm: Normal rate and regular rhythm.     Heart sounds: Normal heart sounds.  Pulmonary:     Effort: Pulmonary effort is normal.     Breath sounds: Normal breath sounds.  Skin:    General: Skin is warm and dry.     Comments: Slightly ingrown toenail fibular aspect right great toe, no surrounding erythema, swelling, or drainage  Neurological:     Mental Status: She is alert.      Assessment/Plan: Please see individual problem list.  Ingrown toenail No obvious signs of infection.  Refer to podiatry.  Diabetes mellitus without complication (HCC) Check A1c.  Continue current medication.  IBS (irritable bowel syndrome) This is likely contributing to her nausea.  Refill Zofran.  Refer to GI.  Chronic diastolic heart failure (HCC) Symptoms respond well to as needed Lasix.  She will monitor.  If they occur more frequently she will be evaluated.  Discussed continuing follow-up with cardiology.  Anxiety and depression I advised that she has to see a psychiatrist for future refills of Zyprexa.  She was given contact information for [facility] in [place].  Metabolic labs ordered.  Hyperlipidemia Check lipid panel.  Will reconsider Repatha for this depending on her levels.  Suspect she will likely need this given her prior significant elevation.    Orders Placed This Encounter  Procedures  .  Lipid panel  . Comp Met (CMET)  . HgB A1c  . Urine Microalbumin w/creat. ratio  . CBC  . Ambulatory referral to Podiatry    Referral Priority:   Routine    Referral Type:   Consultation    Referral Reason:   Specialty Services Required    Requested Specialty:   Podiatry    Number of Visits Requested:   1  . Ambulatory referral to Gastroenterology    Referral Priority:   Routine    Referral Type:   Consultation    Referral Reason:   Specialty Services Required    Number of Visits Requested:   1    Meds  ordered this encounter  Medications  . ondansetron (ZOFRAN) 4 MG tablet    Sig: Take 1 tablet (4 mg total) by mouth 2 (two) times daily as needed for nausea or vomiting.    Dispense:  30 tablet    Refill:  0    This visit occurred during the SARS-CoV-2 public health emergency.  Safety protocols were in place, including screening questions prior to the visit, additional usage of staff PPE, and extensive cleaning of exam room while observing appropriate contact time as indicated for disinfecting solutions.    [name], MD [place]

## 2020-01-27 NOTE — Assessment & Plan Note (Signed)
This is likely contributing to her nausea.  Refill Zofran.  Refer to GI.

## 2020-01-27 NOTE — Assessment & Plan Note (Signed)
Check lipid panel.  Will reconsider Repatha for this depending on her levels.  Suspect she will likely need this given her prior significant elevation.

## 2020-01-27 NOTE — Assessment & Plan Note (Signed)
Check A1c.  Continue current medication. °

## 2020-01-27 NOTE — Progress Notes (Signed)
Error

## 2020-01-27 NOTE — Assessment & Plan Note (Signed)
Symptoms respond well to as needed Lasix.  She will monitor.  If they occur more frequently she will be evaluated.  Discussed continuing follow-up with cardiology.

## 2020-01-27 NOTE — Assessment & Plan Note (Signed)
No obvious signs of infection.  Refer to podiatry.

## 2020-01-27 NOTE — Assessment & Plan Note (Signed)
I advised that she has to see a psychiatrist for future refills of Zyprexa.  She was given contact information for [facility] in [place].  Metabolic labs ordered.

## 2020-01-27 NOTE — Progress Notes (Signed)
Subjective:   Valerie Vazquez is a 64 y.o. female who presents for Medicare Annual (Subsequent) preventive examination.  Review of Systems:  No ROS.  Medicare Wellness Virtual Visit.  Visual/audio telehealth visit, UTA vital signs.   See social history for additional risk factors.   Cardiac Risk Factors include: advanced age (>54mn, >>43women);hypertension     Objective:     Vitals: There were no vitals taken for this visit.  There is no height or weight on file to calculate BMI.  Advanced Directives 01/27/2020 12/19/2019 05/24/2019 05/19/2019 05/12/2019 03/07/2019 09/29/2018  Does Patient Have a Medical Advance Directive? No No No No No No No  Would patient like information on creating a medical advance directive? No - Patient declined Yes (MAU/Ambulatory/Procedural Areas - Information given) No - Patient declined - - - No - Patient declined  Some encounter information is confidential and restricted. Go to Review Flowsheets activity to see all data.    Tobacco Social History   Tobacco Use  Smoking Status Former Smoker  . Packs/day: 1.50  . Years: 10.00  . Pack years: 15.00  . Types: Cigarettes  . Quit date: 04/12/1979  . Years since quitting: 40.8  Smokeless Tobacco Never Used     Counseling given: Not Answered   Clinical Intake:  Pre-visit preparation completed: Yes        Diabetes: No  How often do you need to have someone help you when you read instructions, pamphlets, or other written materials from your doctor or pharmacy?: 1 - Never  Interpreter Needed?: No     Past Medical History:  Diagnosis Date  . Acute right-sided low back pain with right-sided sciatica 10/23/2017  . Anemia   . Bilateral renal cysts   . CAD (coronary artery disease) 09/28/2016  . Chest pain 10/14/2015  . Chronic back pain    "upper and lower" (09/19/2014)  . Chronic shoulder pain (Location of Primary Source of Pain) (Left) 01/29/2016  . Colitis 04/08/2016  . Coronary vasospasm ([facility]  12/09/2014  . Depression    "major" (09/19/2014)  . DVT (deep venous thrombosis) ([facility]   . Esophageal spasm   . Essential hypertension 10/28/2013  . Gallstones   . Gastritis   . Gastroenteritis   . GERD (gastroesophageal reflux disease)   . Headache    "couple /month lately" (09/19/2014)  . Heart disease 12/09/2016  . High blood pressure   . High cholesterol   . History of blood transfusion 1985   related to hysterectomy  . History of nuclear stress test    Myoview 1/19:  EF 65, no ischemia or scar  . History of pulmonary embolus (PE) 09/19/2014  . Hx of cervical spine surgery 01/29/2016  . 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   . Hyperlipidemia 10/28/2013  . Hypertension   . IBS (irritable bowel syndrome)   . Lumbar central spinal stenosis (moderate at L4-5; mild at L2-3 and L3-4) 01/29/2016  . Migraine    "a few times/yr" (09/19/2014)  . Myocardial infarction ([facility] 10/2010 X 3   "while hospitalized"  . Neck pain 01/29/2016  . Osteoarthritis    "qwhere; mostly around my joints" (09/19/2014)  . Pleural effusion, bilateral 07/31/2015  . Pneumonia 04/2014  . PTSD (post-traumatic stress disorder)   . Pulmonary embolism ([facility] 04/2001; 09/19/2014   "after gallbladder OR; "  . Schizoaffective disorder ([facility]   . Schizoaffective disorder, depressive type ([facility]  08/28/2016  . Sleep apnea    "mild" (09/19/2014)  . Snoring    a. sleep study 5/16: No OSA  . SOB (shortness of breath) 07/31/2015  . Spinal stenosis   . Spondylosis   . Suicide attempt ([place]) 08/27/2016  . Type II diabetes mellitus ([place])    "dx'd in 2005/11/12; lost weight; no RX for ~ 4 yr now" (09/19/2014)  . Wheezing 02/25/2017   Past Surgical History:  Procedure Laterality Date  . ABDOMINAL HYSTERECTOMY  1985  . ANTERIOR CERVICAL DECOMP/DISCECTOMY FUSION  11-12-2012   in [place] C5-C7   . APPENDECTOMY  1985  . BACK SURGERY    . BREAST CYST EXCISION Right    . BREAST EXCISIONAL BIOPSY Right YRS AGO   NEG  . CARDIAC CATHETERIZATION   2000's; 2007-11-13  . CHOLECYSTECTOMY  2000-11-12  . COLONOSCOPY WITH PROPOFOL N/A 12/12/2016   Procedure: COLONOSCOPY WITH PROPOFOL;  Surgeon: [name], MD;  Location: [facility] ENDOSCOPY;  Service: Endoscopy;  Laterality: N/A;  . COLONOSCOPY WITH PROPOFOL N/A 02/17/2017   Procedure: COLONOSCOPY WITH PROPOFOL;  Surgeon: [name], MD;  Location: [facility] ENDOSCOPY;  Service: Gastroenterology;  Laterality: N/A;  . ESOPHAGOGASTRODUODENOSCOPY (EGD) WITH PROPOFOL N/A 02/17/2017   Procedure: ESOPHAGOGASTRODUODENOSCOPY (EGD) WITH PROPOFOL;  Surgeon: [name], MD;  Location[facility] ENDOSCOPY;  Service: Gastroenterology;  Laterality: N/A;  . ESOPHAGOGASTRODUODENOSCOPY (EGD) WITH PROPOFOL N/A 03/07/2019   Procedure: ESOPHAGOGASTRODUODENOSCOPY (EGD) WITH PROPOFOL;  Surgeon: [name], MD;  Location[facility] ENDOSCOPY;  Service: Gastroenterology;  Laterality: N/A;  . EXCISION/RELEASE BURSA HIP Right 12/1984  . HERNIA REPAIR  1985  . LEFT HEART CATHETERIZATION WITH CORONARY ANGIOGRAM N/A 12/09/2014   Procedure: LEFT HEART CATHETERIZATION WITH CORONARY ANGIOGRAM;  Surgeon: Burnell Blanks, MD;  Location: [facility] CATH LAB;  Service: Cardiovascular;  Laterality: N/A;  . TONSILLECTOMY AND ADENOIDECTOMY  ~ 11-13-1966  . TOTAL HIP ARTHROPLASTY Left 04-06-2014  . UMBILICAL HERNIA REPAIR  1985  . VENA CAVA FILTER PLACEMENT  03/2014   Family History  Problem Relation Age of Onset  . Stroke Mother        Deceased  . Lung cancer Father        Deceased  . Healthy Daughter   . Healthy Son        x 2  . Other Son        Suicide  . Cancer Brother   . Heart attack Neg Hx    Social History   Socioeconomic History  . Marital status: Divorced    Spouse name: Not on file  . Number of children: Not on file  . Years of education: Not on file  . Highest education level: Not on file  Occupational History  . Not on file  Tobacco Use  . Smoking status: Former  Smoker    Packs/day: 1.50    Years: 10.00    Pack years: 15.00    Types: Cigarettes    Quit date: 04/12/1979    Years since quitting: 40.8  . Smokeless tobacco: Never Used  Substance and Sexual Activity  . Alcohol use: No    Alcohol/week: 0.0 standard drinks  . Drug use: No  . Sexual activity: Not Currently  Other Topics Concern  . Not on file  Social History Narrative   Lives with fiancee in an apartment on the first floor.  Has 3 grown children.  One child passed away.  On disability 11/13/1994 - 2000, 2007 - current.     Education: some college.   Social  Determinants of Health   Financial Resource Strain:   . Difficulty of Paying Living Expenses:   Food Insecurity: No Food Insecurity  . Worried About Charity fundraiser in the Last Year: Never true  . Ran Out of Food in the Last Year: Never true  Transportation Needs: No Transportation Needs  . Lack of Transportation (Medical): No  . Lack of Transportation (Non-Medical): No  Physical Activity:   . Days of Exercise per Week:   . Minutes of Exercise per Session:   Stress:   . Feeling of Stress :   Social Connections:   . Frequency of Communication with Friends and Family:   . Frequency of Social Gatherings with Friends and Family:   . Attends Religious Services:   . Active Member of Clubs or Organizations:   . Attends Archivist Meetings:   Marland Kitchen Marital Status:     Outpatient Encounter Medications as of 01/27/2020  Medication Sig  . acetaminophen (TYLENOL) 500 MG tablet Take 1 tablet (500 mg total) by mouth every 6 (six) hours as needed.  . baclofen (LIORESAL) 10 MG tablet TAKE 1 TABLET BY MOUTH THREE TIMES A DAY AS NEEDED FOR MUSCLE SPASMS  . blood glucose meter kit and supplies KIT Dispense based on patient and insurance preference. Use once daily. Dx code E11.9.  Marland Kitchen carvedilol (COREG) 3.125 MG tablet TAKE 1 TABLET (3.125 MG TOTAL) BY MOUTH 2 (TWO) TIMES DAILY WITH A MEAL.  . cetirizine (ZYRTEC) 10 MG tablet Take 1  tablet (10 mg total) by mouth daily.  . diclofenac sodium (VOLTAREN) 1 % GEL Apply 2 g topically 4 (four) times daily. Right arm/shoulder arthritis  . dicyclomine (BENTYL) 20 MG tablet TAKE 1 TABLET BY MOUTH THREE TIMES A DAY AS NEEDED FOR MUSCLE SPASMS  . diphenhydrAMINE (BENADRYL) 25 MG tablet Take 1 tablet (25 mg total) by mouth every 6 (six) hours.  Marland Kitchen ELIQUIS 5 MG TABS tablet TAKE 1 TABLET BY MOUTH TWICE A DAY  . escitalopram (LEXAPRO) 20 MG tablet Take 1 tablet (20 mg total) by mouth daily.  . Evolocumab (REPATHA SURECLICK) 371 MG/ML SOAJ Inject 140 mg into the skin every 14 (fourteen) days.  . famotidine (PEPCID) 20 MG tablet TAKE 1 TABLET BY MOUTH EVERYDAY AT BEDTIME  . furosemide (LASIX) 20 MG tablet TAKE 1 TABLET (20 MG TOTAL) BY MOUTH DAILY AS NEEDED FOR EDEMA.  Marland Kitchen gabapentin (NEURONTIN) 600 MG tablet Take 1-1.5 tablets (600-900 mg total) by mouth 2 (two) times daily. Take 1 tablet in the AM and 1.5 tablet at bedtime  . glucose blood test strip Check once daily, dispense per patient and insurance preference, dx code E11.9  . isosorbide mononitrate (IMDUR) 30 MG 24 hr tablet TAKE 1.5 TABLETS (45 MG TOTAL) BY MOUTH DAILY.  Marland Kitchen JANUVIA 100 MG tablet TAKE 1 TABLET BY MOUTH EVERY DAY  . JARDIANCE 10 MG TABS tablet TAKE 1 TABLET BY MOUTH EVERY DAY  . KLOR-CON M20 20 MEQ tablet TAKE 1 TABLET (20 MEQ TOTAL) BY MOUTH DAILY AS NEEDED (TAKE WITH LASIX).  Marland Kitchen levothyroxine (SYNTHROID) 25 MCG tablet TAKE 1 TABLET BY MOUTH BEFORE BREAKFAST 2-3 HOURS BEFORE TAKING ANY OTHER MEDICATION OR SUPPLEMENT  . lidocaine (LIDODERM) 5 % Place 1 patch onto the skin daily. Remove & Discard patch within 12 hours.  . Multiple Vitamins-Minerals (SUPER THERA VITE M) TABS Take by mouth.  Marland Kitchen NIFEdipine (PROCARDIA) 10 MG capsule TAKE 1 CAPSULE BY MOUTH 2 TIMES DAILY. TAKE 30 MINS BEFORE MEALS  UP TO 1X PER DAY  . nitroGLYCERIN (NITROSTAT) 0.4 MG SL tablet Place 1 tablet (0.4 mg total) under the tongue every 5 (five) minutes as  needed for chest pain.  Marland Kitchen OLANZapine (ZYPREXA) 10 MG tablet TAKE 1 TABLET BY MOUTH EVERYDAY AT BEDTIME  . ondansetron (ZOFRAN) 4 MG tablet TAKE 1 TABLET (4 MG TOTAL) BY MOUTH 2 (TWO) TIMES DAILY AS NEEDED FOR NAUSEA OR VOMITING.  . pantoprazole (PROTONIX) 40 MG tablet TAKE 1 TABLET BY MOUTH EVERY DAY  . penicillin v potassium (VEETID) 500 MG tablet Take 1 tablet (500 mg total) by mouth 4 (four) times daily.  . pravastatin (PRAVACHOL) 10 MG tablet TAKE 1 TABLET BY MOUTH EVERYDAY AT BEDTIME  . [DISCONTINUED] baclofen (LIORESAL) 10 MG tablet Take 1 tablet (10 mg total) by mouth 3 (three) times daily as needed for muscle spasms.  . [DISCONTINUED] carvedilol (COREG) 3.125 MG tablet Take 1 tablet (3.125 mg total) by mouth 2 (two) times daily with a meal.  . [DISCONTINUED] dicyclomine (BENTYL) 20 MG tablet TAKE 1 TABLET BY MOUTH THREE TIMES A DAY AS NEEDED FOR MUSCLE SPASMS  . [DISCONTINUED] ELIQUIS 5 MG TABS tablet TAKE 1 TABLET BY MOUTH TWICE A DAY  . [DISCONTINUED] empagliflozin (JARDIANCE) 10 MG TABS tablet Take 10 mg by mouth daily.  . [DISCONTINUED] enoxaparin (LOVENOX) 100 MG/ML injection Inject 1 mL (100 mg total) into the skin every 12 (twelve) hours. See instructions provided on Mychart.  . [DISCONTINUED] escitalopram (LEXAPRO) 20 MG tablet Take 1 tablet (20 mg total) by mouth daily.  . [DISCONTINUED] famotidine (PEPCID) 20 MG tablet TAKE 1 TABLET BY MOUTH EVERYDAY AT BEDTIME  . [DISCONTINUED] furosemide (LASIX) 20 MG tablet TAKE 1 TABLET (20 MG TOTAL) BY MOUTH DAILY AS NEEDED FOR EDEMA.  . [DISCONTINUED] gabapentin (NEURONTIN) 600 MG tablet Take 1-1.5 tablets (600-900 mg total) by mouth 2 (two) times daily. Take 1 tablet in the AM and 1.5 tablet at bedtime  . [DISCONTINUED] isosorbide mononitrate (IMDUR) 30 MG 24 hr tablet TAKE 1.5 TABLETS (45 MG TOTAL) BY MOUTH DAILY.  . [DISCONTINUED] JANUVIA 100 MG tablet TAKE 1 TABLET BY MOUTH EVERY DAY  . [DISCONTINUED] KLOR-CON M20 20 MEQ tablet TAKE 1  TABLET (20 MEQ TOTAL) BY MOUTH DAILY AS NEEDED (TAKE WITH LASIX).  . [DISCONTINUED] nitroGLYCERIN (NITROSTAT) 0.4 MG SL tablet Place 1 tablet (0.4 mg total) under the tongue every 5 (five) minutes as needed for chest pain.  . [DISCONTINUED] OLANZapine (ZYPREXA) 10 MG tablet TAKE 1 TABLET BY MOUTH EVERYDAY AT BEDTIME  . [DISCONTINUED] ondansetron (ZOFRAN) 4 MG tablet TAKE 1 TABLET (4 MG TOTAL) BY MOUTH 2 (TWO) TIMES DAILY AS NEEDED FOR NAUSEA OR VOMITING.  . [DISCONTINUED] oxyCODONE-acetaminophen (PERCOCET) 5-325 MG tablet Take 1 tablet by mouth 2 (two) times daily as needed for severe pain.  . [DISCONTINUED] pantoprazole (PROTONIX) 40 MG tablet TAKE 1 TABLET BY MOUTH EVERY DAY  . [DISCONTINUED] penicillin v potassium (VEETID) 500 MG tablet Take 1 tablet (500 mg total) by mouth 4 (four) times daily.  . [DISCONTINUED] pravastatin (PRAVACHOL) 10 MG tablet TAKE 1 TABLET BY MOUTH EVERYDAY AT BEDTIME   No facility-administered encounter medications on file as of 01/27/2020.    Activities of Daily Living In your present state of health, do you have any difficulty performing the following activities: 01/27/2020 12/19/2019  Hearing? N -  Vision? N -  Difficulty concentrating or making decisions? N N  Walking or climbing stairs? Y Y  Comment - "Sometimes" per patient report; "  I just take my time"  Dressing or bathing? N N  Doing errands, shopping? Y N  Comment - Son assists with transportation as indicated per patient report  Preparing Food and eating ? Y N  Comment Son assist with meal prep. Self feeds. -  Using the Toilet? N N  In the past six months, have you accidently leaked urine? Y N  Do you have problems with loss of bowel control? N N  Managing your Medications? N N  Managing your Finances? N N  Comment Son assist as needed -  Housekeeping or managing your Housekeeping? Y Y  Comment Son assist son assists as indicated  Some recent data might be hidden    Patient Care Team: [name], MD as PCP - General (Family Medicine) Burnell Blanks, MD as PCP - Cardiology (Cardiology) Clent Jacks, RN as Oncology Nurse Navigator    Assessment:   This is a routine wellness examination for Jelisha.  Nurse connected with patient 01/27/20 at 10:30 AM EDT by a telephone enabled telemedicine application and verified that I am speaking with the correct person using two identifiers. Patient stated full name and DOB. Patient gave permission to continue with virtual visit. Patient's location was at home and Nurse's location was at [place] office.   Patient is alert and oriented x3. Patient denies difficulty focusing or concentrating. Patient likes to share a shopping app with her son for brain challenging activity.   Health Maintenance Due: -PNA vaccine- discussed; to be completed with doctor in visit or local pharmacy.  -Tdap vaccine- discussed; to be completed with doctor in visit or local pharmacy.   -Shingles vaccine- discussed; to be completed with doctor in visit or local pharmacy.   -Dexa Scan-  -Mammogram-  -Eye Exam- -Foot Exam- -Hgb A1c-  See completed HM at the end of note.   Eye: Visual acuity not assessed. Virtual visit. Followed by their ophthalmologist.  Hearing: Demonstrates normal hearing during visit.  Safety:  Patient feels safe at home- yes Patient does have smoke detectors at home- yes Patient does wear sunscreen or protective clothing when in direct sunlight - yes Patient does wear seat belt when in a moving vehicle - yes Patient drives-no Adequate lighting in walkways free from debris- yes Grab bars and handrails used as appropriate- yes Ambulates with an assistive device- yes; rollator as needed. Cell phone on person when ambulating outside of the home- yes. Life alert- yes  Medication: Taking as directed and without issues.  Self managed - yes  Covid-19: Precautions and sickness symptoms discussed. Wears mask, social  distancing, hand hygiene as appropriate.   Activities of Daily Living Assisted by son with ADLs as needed.   Discussed the importance of a healthy diet, water intake and the benefits of aerobic exercise.   Physical activity- no routine. Encouraged to stay active.  Diet:  Regular Water: fair  Other Providers Patient Care Team: [name], MD as PCP - General (Family Medicine) Burnell Blanks, MD as PCP - Cardiology (Cardiology) Clent Jacks, RN as Oncology Nurse Navigator  Exercise Activities and Dietary recommendations    Goals      Patient Stated   . Increase physical activity (pt-stated)     Walk for exercise as tolerated      Other   . Increase water intake       Fall Risk Fall Risk  01/27/2020 12/19/2019 05/20/2019 02/09/2019 01/20/2017  Falls in the past year? 0 0  1 0 -  Number falls in past yr: 0 0 0 0 -  Injury with Fall? - 0 1 0 -  Comment - N/A- no falls reported - - States she hurt L knee 3 weeks ago due to missing a step.  She did not seek medical attention.  Risk Factor Category  - - - - -  Risk for fall due to : - Impaired mobility;Other (Comment);Medication side effect - - -  Risk for fall due to: Comment - chronic pain - - -  Follow up Falls evaluation completed Falls prevention discussed Falls evaluation completed Falls evaluation completed -  Some encounter information is confidential and restricted. Go to Review Flowsheets activity to see all data.   Is the patient's home free of loose throw rugs in walkways, pet beds, electrical cords, etc?   No. Encouraged to remove throw rugs.       Grab bars in the bathroom? Yes      Handrails on the stairs? Yes      Adequate lighting? Yes  Timed Get Up and Go performed: No, virtual visit.  Depression Screen PHQ 2/9 Scores 01/27/2020 12/19/2019 05/24/2019 05/23/2019  PHQ - 2 Score 0 0 0 0  PHQ- 9 Score - - 0 0  Some encounter information is confidential and restricted. Go to Review Flowsheets  activity to see all data.     Cognitive Function     6CIT Screen 01/27/2020  What Year? 0 points  What month? 0 points  What time? 0 points  Months in reverse 0 points  Repeat phrase 0 points  Some encounter information is confidential and restricted. Go to Review Flowsheets activity to see all data.     There is no immunization history on file for this patient.  Screening Tests Health Maintenance  Topic Date Due  . COVID-19 Vaccine (1) Never done  . TETANUS/TDAP  Never done  . PNA vac Low Risk Adult (1 of 2 - PCV13) Never done  . HEMOGLOBIN A1C  08/26/2019  . FOOT EXAM  09/29/2019  . URINE MICROALBUMIN  10/01/2019  . INFLUENZA VACCINE  04/01/2020  . OPHTHALMOLOGY EXAM  10/27/2020  . MAMMOGRAM  04/28/2021  . COLONOSCOPY  02/17/2022  . DEXA SCAN  Completed  . Hepatitis C Screening  Completed  . HIV Screening  Completed     Plan:   Keep all routine maintenance appointments.   Follow up with your doctor today at 11:00.  I have personally reviewed and noted the following in the patient's chart:   . Medical and social history . Use of alcohol, tobacco or illicit drugs  . Current medications and supplements . Functional ability and status . Nutritional status . Physical activity . Advanced directives . List of other physicians . Hospitalizations, surgeries, and ER visits in previous 12 months . Vitals . Screenings to include cognitive, depression, and falls . Referrals and appointments  I have reviewed and discussed with patient certain preventive protocols, quality metrics, and best practice recommendations.     Varney Biles, LPN  0/06/4044

## 2020-01-27 NOTE — Progress Notes (Signed)
I have reviewed the above note and agree.  Antwuan Eckley, M.D.  

## 2020-01-27 NOTE — Patient Instructions (Addendum)
  Valerie Vazquez , Thank you for taking time to come for your Medicare Wellness Visit. I appreciate your ongoing commitment to your health goals. Please review the following plan we discussed and let me know if I can assist you in the future.   These are the goals we discussed: Goals      Patient Stated   . Increase physical activity (pt-stated)     Walk for exercise as tolerated      Other   . Increase water intake       This is a list of the screening recommended for you and due dates:  Health Maintenance  Topic Date Due  . COVID-19 Vaccine (1) Never done  . Tetanus Vaccine  Never done  . Pneumonia vaccines (1 of 2 - PCV13) Never done  . Hemoglobin A1C  08/26/2019  . Complete foot exam   09/29/2019  . Urine Protein Check  10/01/2019  . Flu Shot  04/01/2020  . Eye exam for diabetics  10/27/2020  . Mammogram  04/28/2021  . Colon Cancer Screening  02/17/2022  . DEXA scan (bone density measurement)  Completed  .  Hepatitis C: One time screening is recommended by Center for Disease Control  (CDC) for  adults born from [birth year] through 1967.   Completed  . HIV Screening  Completed

## 2020-01-27 NOTE — Patient Instructions (Addendum)
Nice to see you. We will get labs today. Please contact [facility] to get set up with a psychiatrist. Phone: [phone].  You can also walk-in on Monday, Wednesday, or Friday between 8 AM and 3 PM.  You have to see a psychiatrist for your Zyprexa moving forward. I have referred you to a podiatrist. You will be due for cardiology follow-up in October.  If you develop more frequent chest discomfort please seek medical attention. I have referred you back to GI as well.

## 2020-01-31 ENCOUNTER — Ambulatory Visit
Payer: Medicare HMO | Attending: Student in an Organized Health Care Education/Training Program | Admitting: Student in an Organized Health Care Education/Training Program

## 2020-01-31 ENCOUNTER — Other Ambulatory Visit: Payer: Self-pay | Admitting: Family Medicine

## 2020-01-31 ENCOUNTER — Other Ambulatory Visit: Payer: Self-pay

## 2020-01-31 ENCOUNTER — Encounter: Payer: Self-pay | Admitting: Student in an Organized Health Care Education/Training Program

## 2020-01-31 VITALS — BP 128/77 | HR 80 | Temp 97.8°F | Resp 18 | Ht 66.0 in | Wt 228.0 lb

## 2020-01-31 DIAGNOSIS — G894 Chronic pain syndrome: Secondary | ICD-10-CM

## 2020-01-31 DIAGNOSIS — M542 Cervicalgia: Secondary | ICD-10-CM | POA: Insufficient documentation

## 2020-01-31 DIAGNOSIS — M4802 Spinal stenosis, cervical region: Secondary | ICD-10-CM | POA: Diagnosis not present

## 2020-01-31 DIAGNOSIS — Q761 Klippel-Feil syndrome: Secondary | ICD-10-CM | POA: Insufficient documentation

## 2020-01-31 DIAGNOSIS — F250 Schizoaffective disorder, bipolar type: Secondary | ICD-10-CM

## 2020-01-31 DIAGNOSIS — M5412 Radiculopathy, cervical region: Secondary | ICD-10-CM | POA: Diagnosis not present

## 2020-01-31 MED ORDER — TIZANIDINE HCL 4 MG PO TABS: 4.0000 mg | ORAL_TABLET | Freq: Two times a day (BID) | ORAL | 1 refills | Status: AC | PRN

## 2020-01-31 NOTE — Progress Notes (Signed)
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 (Anemia) Endocrine: High blood sugar controlled without the use of insulin (NIDDM) Rheumatologic: Joint aches and or swelling due to excess weight (Osteoarthritis) Musculoskeletal: Negative for myasthenia gravis, muscular dystrophy, multiple sclerosis or malignant hyperthermia Work History: Disabled  Allergies  Ms. Himmelberger is allergic to abilify [aripiprazole]; augmentin [amoxicillin-pot clavulanate]; bactrim [sulfamethoxazole-trimethoprim]; ceftin [cefuroxime axetil]; coconut oil; diclofenac; dye fdc red [red dye]; indocin [indomethacin]; linaclotide; lisinopril; morphine; morphine and related; simvastatin; sulfa antibiotics; sulfamethoxazole-trimethoprim; wellbutrin [bupropion]; zetia [ezetimibe]; quetiapine fumarate; and  quetiapine fumarate.  Laboratory Chemistry Profile   Renal Lab Results  Component Value Date   BUN  13 01/27/2020   CREATININE 0.84 01/27/2020   BCR 11 03/19/2018   GFR 82.13 01/27/2020   GFRAA >60 05/19/2019   GFRNONAA >60 05/19/2019   SPECGRAV >=1.030 (A) 10/23/2017   PHUR 5.0 10/23/2017   PROTEINUR NEGATIVE 05/19/2019     Electrolytes Lab Results  Component Value Date   NA 140 01/27/2020   K 4.0 01/27/2020   CL 104 01/27/2020   CALCIUM 9.0 01/27/2020   MG 2.2 04/05/2016     Hepatic Lab Results  Component Value Date   AST 16 01/27/2020   ALT 16 01/27/2020   ALBUMIN 3.9 01/27/2020   ALKPHOS 116 01/27/2020   LIPASE <10 (L) 09/29/2016     ID Lab Results  Component Value Date   SARSCOV2NAA Not Detected 05/20/2019   MRSAPCR NEGATIVE 12/10/2014   HCVAB NEGATIVE 01/12/2017   PREGTESTUR Negative 12/28/2015     Bone Lab Results  Component Value Date   25OHVITD1 25 (L) 01/31/2016   25OHVITD2 1.2 01/31/2016   25OHVITD3 24 01/31/2016     Endocrine Lab Results  Component Value Date   GLUCOSE 159 (H) 01/27/2020   GLUCOSEU >=500 (A) 05/19/2019   HGBA1C 7.4 (H) 01/27/2020   TSH 1.81 06/15/2019   FREET4 0.81 08/21/2017     Neuropathy Lab Results  Component Value Date   VITAMINB12 617 11/12/2015   HGBA1C 7.4 (H) 01/27/2020     CNS No results found for: COLORCSF, APPEARCSF, RBCCOUNTCSF, WBCCSF, POLYSCSF, LYMPHSCSF, EOSCSF, PROTEINCSF, GLUCCSF, JCVIRUS, CSFOLI, IGGCSF, LABACHR, ACETBL, LABACHR, ACETBL   Inflammation (CRP: Acute  ESR: Chronic) Lab Results  Component Value Date   CRP 2.0 04/01/2016   ESRSEDRATE 23 04/01/2016     Rheumatology Lab Results  Component Value Date   RF <10 04/01/2016   ANA Negative 04/01/2016     Coagulation Lab Results  Component Value Date   INR 1.21 03/26/2016   LABPROT 15.4 (H) 03/26/2016   APTT 28.6 03/22/2014   PLT 324.0 01/27/2020   DDIMER 0.43 08/21/2017   AT3 123 (H) 01/23/2016     Cardiovascular Lab  Results  Component Value Date   BNP 13.0 02/23/2017   TROPONINI <0.03 07/17/2018   HGB 13.0 01/27/2020   HCT 39.2 01/27/2020     Screening Lab Results  Component Value Date   SARSCOV2NAA Not Detected 05/20/2019   MRSAPCR NEGATIVE 12/10/2014   HCVAB NEGATIVE 01/12/2017   PREGTESTUR Negative 12/28/2015     Cancer No results found for: CEA, CA125, LABCA2   Allergens No results found for: ALMOND, APPLE, ASPARAGUS, AVOCADO, BANANA, BARLEY, BASIL, BAYLEAF, GREENBEAN, LIMABEAN, WHITEBEAN, BEEFIGE, REDBEET, BLUEBERRY, BROCCOLI, CABBAGE, MELON, CARROT, CASEIN, CASHEWNUT, CAULIFLOWER, CELERY     Note: Lab results reviewed.   PFSH  Drug: Ms. Horsley  reports no history of drug use. Alcohol:  reports no history of alcohol use. Tobacco:  reports that she quit smoking about 40 years ago. Her smoking use included cigarettes. She has a 15.00 pack-year smoking history. She has never used smokeless tobacco. Medical:  has a past medical history of Acute right-sided low back pain with right-sided sciatica (10/23/2017), Anemia, Bilateral renal cysts, CAD (coronary artery disease) (09/28/2016), Chest pain (10/14/2015), Chronic back pain, Chronic shoulder pain (Location of Primary Source of Pain) (Left) (01/29/2016), Colitis (04/08/2016), Coronary vasospasm ([place]) (12/09/2014), Depression, DVT (deep venous thrombosis) ([place]), Esophageal spasm, Essential hypertension (10/28/2013), Gallstones, Gastritis, Gastroenteritis, GERD (gastroesophageal reflux disease), Headache, Heart disease (12/09/2016), High blood pressure, High cholesterol, History of blood transfusion (1985), History of nuclear stress test, History  of pulmonary embolus (PE) (09/19/2014), cervical spine surgery (01/29/2016), Hypercementosis (02/13/2015), Hyperlipidemia (10/28/2013), Hypertension, IBS (irritable bowel syndrome), Lumbar central spinal stenosis (moderate at L4-5; mild at L2-3 and L3-4) (01/29/2016), Migraine, Myocardial infarction ([place]) (10/2010 X 3), Neck  pain (01/29/2016), Osteoarthritis, Pleural effusion, bilateral (07/31/2015), Pneumonia (04/2014), PTSD (post-traumatic stress disorder), Pulmonary embolism ([place]) (04/2001; 09/19/2014), Schizoaffective disorder ([place]), Schizoaffective disorder, depressive type ([place]) (08/28/2016), Sleep apnea, Snoring, SOB (shortness of breath) (07/31/2015), Spinal stenosis, Spondylosis, Suicide attempt ([place]) (08/27/2016), Type II diabetes mellitus ([place]), and Wheezing (02/25/2017). Family: family history includes Cancer in her brother; Healthy in her daughter and son; Lung cancer in her father; Other in her son; Stroke in her mother.  Past Surgical History:  Procedure Laterality Date  . ABDOMINAL HYSTERECTOMY  1985  . ANTERIOR CERVICAL DECOMP/DISCECTOMY FUSION  10/2012   in [place] C5-C7   . APPENDECTOMY  1985  . BACK SURGERY    . BREAST CYST EXCISION Right   . BREAST EXCISIONAL BIOPSY Right YRS AGO   NEG  . CARDIAC CATHETERIZATION   2000's; 2009  . CHOLECYSTECTOMY  2002  . COLONOSCOPY WITH PROPOFOL N/A 12/12/2016   Procedure: COLONOSCOPY WITH PROPOFOL;  Surgeon: [name], MD;  Location: [facility] ENDOSCOPY;  Service: Endoscopy;  Laterality: N/A;  . COLONOSCOPY WITH PROPOFOL N/A 02/17/2017   Procedure: COLONOSCOPY WITH PROPOFOL;  Surgeon: [name], MD;  Location[facility] ENDOSCOPY;  Service: Gastroenterology;  Laterality: N/A;  . ESOPHAGOGASTRODUODENOSCOPY (EGD) WITH PROPOFOL N/A 02/17/2017   Procedure: ESOPHAGOGASTRODUODENOSCOPY (EGD) WITH PROPOFOL;  Surgeon: [name], MD;  Location: [facility] ENDOSCOPY;  Service: Gastroenterology;  Laterality: N/A;  . ESOPHAGOGASTRODUODENOSCOPY (EGD) WITH PROPOFOL N/A 03/07/2019   Procedure: ESOPHAGOGASTRODUODENOSCOPY (EGD) WITH PROPOFOL;  Surgeon: [name], MD;  Location: [facility] ENDOSCOPY;  Service: Gastroenterology;  Laterality: N/A;  . EXCISION/RELEASE BURSA HIP Right 12/1984  . HERNIA REPAIR  1985  . LEFT HEART CATHETERIZATION WITH CORONARY ANGIOGRAM N/A 12/09/2014   Procedure: LEFT  HEART CATHETERIZATION WITH CORONARY ANGIOGRAM;  Surgeon: Burnell Blanks, MD;  Location: [facility] CATH LAB;  Service: Cardiovascular;  Laterality: N/A;  . TONSILLECTOMY AND ADENOIDECTOMY  ~ 1968  . TOTAL HIP ARTHROPLASTY Left 04-06-2014  . UMBILICAL HERNIA REPAIR  1985  . VENA CAVA FILTER PLACEMENT  03/2014   Active Ambulatory Problems    Diagnosis Date Noted  . Essential hypertension 10/28/2013  . Anemia 10/28/2013  . Hyperlipidemia 10/28/2013  . IBS (irritable bowel syndrome) 04/11/2014  . GERD (gastroesophageal reflux disease) 04/11/2014  . Anxiety and depression 04/13/2014  . History of pulmonary embolus (PE) 09/19/2014  . Coronary vasospasm ([place]) 12/09/2014  . Constipation 12/12/2014  . 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 History of DVT (deep vein thrombosis) 02/25/2015  . Esophageal spasm 06/05/2015  . Pleural effusion, bilateral 07/31/2015  . Cervicalgia 01/29/2016  . Chronic pain syndrome 01/29/2016  . Osteoarthritis of hip (Bilateral) 01/29/2016  . Lumbar central spinal stenosis (moderate at L4-5; mild at L2-3 and L3-4) 01/29/2016  . Colitis 04/08/2016  . Schizoaffective disorder, depressive type ([place]) 08/28/2016  . Epigastric discomfort 09/16/2016  . CAD (coronary artery disease) 09/28/2016  . Plantar fasciitis of left foot 11/07/2016  . Right upper quadrant pain 01/12/2017  . Colon polyp 01/30/2017  . Diabetes mellitus without complication ([place]) 40/06/2724  . Subclinical hypothyroidism 08/26/2017  . Urinary incontinence 10/27/2017  . Elevated alkaline phosphatase level 10/27/2017  . Dental infection 03/21/2018  . Right hip pain 10/06/2018  . Allergic rhinitis 01/27/2019  . Left wrist pain 05/20/2019  . Cysts of both ovaries 06/15/2019  .  Postmenopausal estrogen deficiency 08/12/2019  . Ingrown toenail 01/27/2020  . Chronic diastolic heart failure ([place]) 01/27/2020  . Foraminal stenosis of cervical region 01/31/2020  . Cervical fusion syndrome (C5-C7 2014) 01/31/2020    Resolved Ambulatory Problems    Diagnosis Date Noted  . History of neck surgery 10/28/2013  . Left hip pain 10/28/2013  . Osteoarthritis of hip (Left) 04/11/2014  . S/P THR (total hip replacement) (Left) 04/11/2014  . Unspecified constipation 04/11/2014  . Insomnia 04/11/2014  . Injury of superior glenoid labrum of shoulder joint 08/16/2014  . Supraspinatus tenosynovitis 08/16/2014  . Cervical spondylosis without myelopathy 08/16/2014  . Impingement syndrome of shoulder 08/30/2014  . Gingivitis 02/26/2015  . Chronic pain of multiple sites 06/05/2015  . Chronic lumbosacral pain 06/05/2015  . SOB (shortness of breath) 07/31/2015  . Cough 07/31/2015  . Pulmonary embolism ([place]) 08/15/2015  . Bilateral pneumonia 08/15/2015  . Nausea and vomiting 08/15/2015  . Chest pain 10/14/2015  . Headache 10/14/2015  . Abdominal pain 10/14/2015  . Depression 10/14/2015  . Lip swelling 10/14/2015  . Breast pain 10/19/2015  . Right wrist pain 11/09/2015  . Numbness 11/19/2015  . Impingement syndrome of left shoulder 08/30/2014  . Absolute anemia 01/29/2016  . Chronic pain 01/29/2016  . Neurogenic pain 01/29/2016  . Musculoskeletal pain 01/29/2016  . Long term current use of opiate analgesic 01/29/2016  . Long term prescription opiate use 01/29/2016  . Opiate use (15 MME/Day) 01/29/2016  . Encounter for therapeutic drug level monitoring 01/29/2016  . Encounter for pain management planning 01/29/2016  . Chronic chest wall pain 01/29/2016  . Chronic low back pain (Location of Secondary source of pain) (Bilateral) (R>L) 01/29/2016  . Disturbance of skin sensation 01/29/2016  . Chronic hip pain (Location of Tertiary source of pain) (Bilateral) (R>L) 01/29/2016  . Lumbar facet arthropathy (Severe at L2-3, L3-4, L4-5, and L5-S1) 01/29/2016  . Lumbar foraminal stenosis (L4-5) (Right) 01/29/2016  . Lumbar facet syndrome 01/29/2016  . Hx of cervical spine surgery 01/29/2016  . Left shoulder pain  02/18/2016  . Abdominal distension 02/18/2016  . Tremor 03/19/2016  . Rash and nonspecific skin eruption 04/16/2016  . Sinusitis, acute maxillary 08/27/2016  . Suicide attempt ([place]) 08/27/2016  . Diarrhea 09/16/2016  . Costochondritis 10/06/2016  . BRBPR (bright red blood per rectum) 10/06/2016  . Heart disease 12/09/2016  . Falls 01/30/2017  . Paresthesia 02/06/2017  . Elevated glucose 02/25/2017  . Bruise 02/25/2017  . Wheezing 02/25/2017  . Acute right eye pain 03/25/2017  . Soft tissue swelling 05/29/2017  . Acute right-sided low back pain with right-sided sciatica 10/23/2017  . Boil 10/27/2017  . Orthostatic hypotension 03/21/2018  . Ovarian cyst 09/02/2018  . Oxygen saturation greater than 90% 09/28/2018   Past Medical History:  Diagnosis Date  . Bilateral renal cysts   . Chronic back pain   . Chronic shoulder pain (Location of Primary Source of Pain) (Left) 01/29/2016  . DVT (deep venous thrombosis) ([place])   . Gallstones   . Gastritis   . Gastroenteritis   . High blood pressure   . High cholesterol   . History of blood transfusion 1985  . History of nuclear stress test   . Hypertension   . Migraine   . Myocardial infarction ([place]) 10/2010 X 3  . Neck pain 01/29/2016  . Osteoarthritis   . Pneumonia 04/2014  . PTSD (post-traumatic stress disorder)   . Schizoaffective disorder ([place])   . Sleep apnea   . Snoring   .  Spinal stenosis   . Spondylosis   . Type II diabetes mellitus ([place])    Constitutional Exam  General appearance: Well nourished, well developed, and well hydrated. In no apparent acute distress Vitals:   01/31/20 1102  BP: 128/77  Pulse: 80  Resp: 18  Temp: 97.8 F (36.6 C)  TempSrc: Temporal  SpO2: 96%  Weight: 228 lb (103.4 kg)  Height: '5\' 6"'$  (1.676 m)   BMI Assessment: Estimated body mass index is 36.8 kg/m as calculated from the following:   Height as of this encounter: '5\' 6"'$  (1.676 m).   Weight as of this encounter: 228 lb (103.4  kg).  BMI interpretation table: BMI level Category Range association with higher incidence of chronic pain  <18 kg/m2 Underweight   18.5-24.9 kg/m2 Ideal body weight   25-29.9 kg/m2 Overweight Increased incidence by 20%  30-34.9 kg/m2 Obese (Class I) Increased incidence by 68%  35-39.9 kg/m2 Severe obesity (Class II) Increased incidence by 136%  >40 kg/m2 Extreme obesity (Class III) Increased incidence by 254%   Patient's current BMI Ideal Body weight  Body mass index is 36.8 kg/m. Ideal body weight: 59.3 kg (130 lb 11.7 oz) Adjusted ideal body weight: 76.9 kg (169 lb 10.2 oz)   BMI Readings from Last 4 Encounters:  01/31/20 36.80 kg/m  01/27/20 36.99 kg/m  01/27/20 36.99 kg/m  10/31/19 35.83 kg/m   Wt Readings from Last 4 Encounters:  01/31/20 228 lb (103.4 kg)  01/27/20 229 lb 3.2 oz (104 kg)  01/27/20 229 lb 3.2 oz (104 kg)  10/31/19 222 lb (100.7 kg)    Psych/Mental status: Alert, oriented x 3 (person, place, & time)       Eyes: PERLA Respiratory: No evidence of acute respiratory distress  Cervical Spine Exam  Skin & Axial Inspection: Well healed scar from previous spine surgery detected Alignment: Symmetrical Functional ROM: Pain restricted ROM, bilaterally Stability: No instability detected Muscle Tone/Strength: Functionally intact. No obvious neuro-muscular anomalies detected. Sensory (Neurological): Dermatomal pain pattern Palpation: No palpable anomalies              Upper Extremity (UE) Exam    Side: Right upper extremity  Side: Left upper extremity  Skin & Extremity Inspection: Skin color, temperature, and hair growth are WNL. No peripheral edema or cyanosis. No masses, redness, swelling, asymmetry, or associated skin lesions. No contractures.  Skin & Extremity Inspection: Skin color, temperature, and hair growth are WNL. No peripheral edema or cyanosis. No masses, redness, swelling, asymmetry, or associated skin lesions. No contractures.  Functional ROM:  Pain restricted ROM for shoulder and elbow  Functional ROM: Pain restricted ROM for shoulder and elbow  Muscle Tone/Strength: Functionally intact. No obvious neuro-muscular anomalies detected.  Muscle Tone/Strength: Functionally intact. No obvious neuro-muscular anomalies detected.  Sensory (Neurological): Dermatomal pain pattern          Sensory (Neurological): Dermatomal pain pattern          Palpation: No palpable anomalies              Palpation: No palpable anomalies              Provocative Test(s):  Phalen's test: deferred Tinel's test: deferred Apley's scratch test (touch opposite shoulder):  Action 1 (Across chest): Decreased ROM Action 2 (Overhead): Decreased ROM Action 3 (LB reach): Decreased ROM   Provocative Test(s):  Phalen's test: deferred Tinel's test: deferred Apley's scratch test (touch opposite shoulder):  Action 1 (Across chest): Decreased ROM Action 2 (Overhead):Decreased ROM Action  3 (LB reach): Decreased ROM    Thoracic Spine Area Exam  Skin & Axial Inspection: No masses, redness, or swelling Alignment: Symmetrical Functional ROM: Unrestricted ROM Stability: No instability detected Muscle Tone/Strength: Functionally intact. No obvious neuro-muscular anomalies detected. Sensory (Neurological): Unimpaired Muscle strength & Tone: No palpable anomalies  Lumbar Exam  Skin & Axial Inspection: No masses, redness, or swelling Alignment: Symmetrical Functional ROM: Unrestricted ROM       Stability: No instability detected Muscle Tone/Strength: Functionally intact. No obvious neuro-muscular anomalies detected. Sensory (Neurological): Unimpaired   Gait & Posture Assessment  Ambulation: Unassisted Gait: Relatively normal for age and body habitus Posture: WNL   Lower Extremity Exam    Side: Right lower extremity  Side: Left lower extremity  Stability: No instability observed          Stability: No instability observed          Skin & Extremity Inspection: Skin  color, temperature, and hair growth are WNL. No peripheral edema or cyanosis. No masses, redness, swelling, asymmetry, or associated skin lesions. No contractures.  Skin & Extremity Inspection: Skin color, temperature, and hair growth are WNL. No peripheral edema or cyanosis. No masses, redness, swelling, asymmetry, or associated skin lesions. No contractures.  Functional ROM: Unrestricted ROM                  Functional ROM: Unrestricted ROM                  Muscle Tone/Strength: Functionally intact. No obvious neuro-muscular anomalies detected.  Muscle Tone/Strength: Functionally intact. No obvious neuro-muscular anomalies detected.  Sensory (Neurological): Unimpaired        Sensory (Neurological): Unimpaired        DTR: Patellar: deferred today Achilles: deferred today Plantar: deferred today  DTR: Patellar: deferred today Achilles: deferred today Plantar: deferred today  Palpation: No palpable anomalies  Palpation: No palpable anomalies   Assessment  Primary Diagnosis & Pertinent Problem List: The primary encounter diagnosis was Cervical radicular pain. Diagnoses of Foraminal stenosis of cervical region, Cervicalgia, Cervical fusion syndrome (C5-C7 2014), and Chronic pain syndrome were also pertinent to this visit.  Visit Diagnosis (New problems to examiner): 1. Cervical radicular pain   2. Foraminal stenosis of cervical region   3. Cervicalgia   4. Cervical fusion syndrome (C5-C7 2014)   5. Chronic pain syndrome    Plan of Care (Initial workup plan)   Ms. Troost is a very pleasant 64 year old female with a history of C5-C7 cervical spinal fusion who presents with a chief complaint of neck pain that radiates into bilateral arms in a dermatomal fashion.  Reason for this is related to the adjacent segment disease as visible on the patient's cervical MRI.  Patient cervical MRI shows degenerative disc osteophyte complexes at C3-C4 and C4-C5 with resultant mild to moderate spinal stenosis  with moderate to severe bilateral C4 and C5 foraminal stenosis.  She also has mild left C8 foraminal stenosis.  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  Patient has an impressive cardiac history as well as history of PE and DVT for which she previously had an IVC filter that was removed.  She is high risk to be off of her Eliquis.  For this reason, I do not recommend a cervical epidural steroid injection as the patient is high risk to be off of her Eliquis for a interventional spinal procedure.  Given her radiographic findings along with clinical exam findings, I recommend to the patient that  she follow back up with her neurosurgeon to discuss surgical intervention for her adjacent segment cervical disease.  She does have anterior muscle spasms along her chest region.  She is on baclofen but does not find that very helpful so we will trial tizanidine as well.  Instructed patient to discontinue baclofen while she is trialing tizanidine.  Patient endorsed understanding.  If she would like to consider a stronger analgesic options such as opioid analgesics, she is welcome to contact our clinic at this time patient states that she will reach back out to the neurosurgeons office to discuss next steps.  Pharmacotherapy (current): Medications ordered:  Meds ordered this encounter  Medications  . tiZANidine (ZANAFLEX) 4 MG tablet    Sig: Take 1 tablet (4 mg total) by mouth 2 (two) times daily as needed for muscle spasms.    Dispense:  60 tablet    Refill:  1    Do not place this medication, or any other prescription from our practice, on "Automatic Refill". Patient may have prescription filled one day early if pharmacy is closed on scheduled refill date.   Medications administered during this visit: Leonie Man. Moehring had no medications administered during this visit.     Provider-requested follow-up: Return if symptoms worsen or fail to improve.  No future appointments.  Note by: Gillis Santa,  MD Date: 01/31/2020; Time: 1:35 PM

## 2020-02-02 ENCOUNTER — Other Ambulatory Visit: Payer: Self-pay | Admitting: Family Medicine

## 2020-02-02 ENCOUNTER — Telehealth: Payer: Self-pay

## 2020-02-02 DIAGNOSIS — E7800 Pure hypercholesterolemia, unspecified: Secondary | ICD-10-CM

## 2020-02-02 DIAGNOSIS — E785 Hyperlipidemia, unspecified: Secondary | ICD-10-CM

## 2020-02-02 MED ORDER — EMPAGLIFLOZIN 25 MG PO TABS: 25.0000 mg | ORAL_TABLET | Freq: Every day | ORAL | 1 refills | Status: DC

## 2020-02-02 MED ORDER — REPATHA SURECLICK 140 MG/ML ~~LOC~~ SOAJ: 140.0000 mg | SUBCUTANEOUS | 1 refills | Status: DC

## 2020-02-02 NOTE — Telephone Encounter (Signed)
Pharmacy is requesting change from [place] to this medication please advise. Directions.

## 2020-02-02 NOTE — Telephone Encounter (Signed)
Praluent sent to pharmacy.

## 2020-02-03 NOTE — Telephone Encounter (Signed)
I called and spoke with the patient and she stated she has taken the simvastatin before and she can't take that, patient states she is on rosuvastatin right now.  But she has never taken Zetia or Lipitor.  Libni Fusaro,cma

## 2020-02-03 NOTE — Telephone Encounter (Signed)
Please contact the patient and see if she can confirm what dose of crestor she is currently taking and who prescribed it. I can not see in her chart where this has ever been prescribed to her and I would expect her cholesterol to be a little better than it is if she was taking crestor. Thanks.

## 2020-02-03 NOTE — Telephone Encounter (Signed)
I called and spoke with the patient and she stated the medication rosuvastatin was written by Caryl Bis and it is 20 mg and she takes it once a week.  Harvey Matlack,cma

## 2020-02-03 NOTE — Telephone Encounter (Signed)
Can you call the patient and see if she has ever taken lipitor, crestor, or zetia? If she has taken them previously please see if she had side effects.

## 2020-02-03 NOTE — Telephone Encounter (Signed)
I can not find that this has been prescribed to her previously in our system. We need to see if she can tolerate daily dosing of crestor and then could consider the praluent if she can not tolerate it. You can send in crestor 20 mg once daily with 90 tablets and 1 refill for her. She will need labs 6 weeks after she starts this with a direct LDL and hepatic function panel for hyperlipidemia. Thanks.

## 2020-02-07 ENCOUNTER — Other Ambulatory Visit: Payer: Self-pay

## 2020-02-07 ENCOUNTER — Other Ambulatory Visit: Payer: Self-pay | Admitting: Family Medicine

## 2020-02-07 ENCOUNTER — Ambulatory Visit: Payer: Medicare HMO | Admitting: Gastroenterology

## 2020-02-07 VITALS — BP 113/72 | HR 76 | Temp 98.6°F | Ht 66.0 in | Wt 229.4 lb

## 2020-02-07 DIAGNOSIS — K581 Irritable bowel syndrome with constipation: Secondary | ICD-10-CM

## 2020-02-07 MED ORDER — ROSUVASTATIN CALCIUM 20 MG PO TABS: 20.0000 mg | ORAL_TABLET | Freq: Every day | ORAL | 1 refills | Status: DC

## 2020-02-07 NOTE — Progress Notes (Signed)
[name] MD, MRCP(U.K) [address]  [place]  [place], [place] [address]  Main: [phone]  Fax: [phone]   Primary Care Physician: [name], MD  Primary Gastroenterologist:  Dr. [name]   Chief Complaint  Patient presents with  . Abdominal Pain    constipation, diarrhea, vomiting    HPI: Valerie Vazquez is a 64 y.o. 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 Constipation resolved with Rx and dysphagia resolved with PPI  EGD 02/17/17-normal ( at the time of endoscopy she had no dysphagia) Colonoscopy 02/17/17- 6 mm serrated adenoma resected.CT abdomen in 09/2016 showed no abnormalities of the liver . 02/09/2019: CMP-normal LFT's , CBC-stable. 02/16/2019: H pylori stool antigen negative 02/18/2019: CT abdomen- small b/l effusions, thickeneing of the distal gastrioc antrum and 3rd portion of the duodenum.  03/07/2019: EGD: gastric bx - gastritis  Interval history 03/28/2019  I previously suggested her to take MiraLAX for constipation but she has not taken at her last visit.  She states that she has had issues with constipation alternating with diarrhea.  More often constipation than diarrhea.  Associated with gas and bloating.  When she does need to have a bowel movement has to take an enema.  Stool can be very hard.  She has not taken any agents recently.  She prefers to take a tablet then to drink an agent.    Current Outpatient Medications  Medication Sig Dispense Refill  . acetaminophen (TYLENOL) 500 MG tablet Take 1 tablet (500 mg total) by mouth every 6 (six) hours as needed. 30 tablet 0  . Alirocumab (PRALUENT) 75 MG/ML SOAJ Inject 75 mg into the skin every 14 (fourteen) days. 6 pen 1  . baclofen (LIORESAL) 10 MG tablet TAKE 1 TABLET BY MOUTH THREE TIMES A DAY AS NEEDED FOR MUSCLE SPASMS 60 tablet 1  . blood glucose meter kit and  supplies KIT Dispense based on patient and insurance preference. Use once daily. Dx code E11.9. 1 each 0  . carvedilol (COREG) 3.125 MG tablet TAKE 1 TABLET (3.125 MG TOTAL) BY MOUTH 2 (TWO) TIMES DAILY WITH A MEAL. 180 tablet 3  . cetirizine (ZYRTEC) 10 MG tablet Take 1 tablet (10 mg total) by mouth daily. 30 tablet 11  . diclofenac sodium (VOLTAREN) 1 % GEL Apply 2 g topically 4 (four) times daily. Right arm/shoulder arthritis 1 Tube 2  . dicyclomine (BENTYL) 20 MG tablet TAKE 1 TABLET BY MOUTH THREE TIMES A DAY AS NEEDED FOR MUSCLE SPASMS 270 tablet 0  . diphenhydrAMINE (BENADRYL) 25 MG tablet Take 1 tablet (25 mg total) by mouth every 6 (six) hours. 12 tablet 0  . ELIQUIS 5 MG TABS tablet TAKE 1 TABLET BY MOUTH TWICE A DAY 60 tablet 5  . empagliflozin (JARDIANCE) 25 MG TABS tablet Take 1 tablet (25 mg total) by mouth daily. 90 tablet 1  . escitalopram (LEXAPRO) 20 MG tablet TAKE 1 TABLET BY MOUTH EVERY DAY 90 tablet 1  . famotidine (PEPCID) 20 MG tablet TAKE 1 TABLET BY MOUTH EVERYDAY AT BEDTIME 90 tablet 0  . furosemide (LASIX) 20 MG tablet TAKE 1 TABLET (20 MG TOTAL) BY MOUTH DAILY AS NEEDED FOR EDEMA. 90 tablet 1  . gabapentin (NEURONTIN) 600 MG tablet Take 1-1.5 tablets (600-900 mg total) by mouth 2 (two) times daily. Take 1 tablet in the AM and 1.5 tablet at bedtime 225 tablet 1  .  glucose blood test strip Check once daily, dispense per patient and insurance preference, dx code E11.9 100 each 12  . isosorbide mononitrate (IMDUR) 30 MG 24 hr tablet TAKE 1.5 TABLETS (45 MG TOTAL) BY MOUTH DAILY. (Patient taking differently: 30 mg daily. ) 135 tablet 3  . JANUVIA 100 MG tablet TAKE 1 TABLET BY MOUTH EVERY DAY 90 tablet 1  . KLOR-CON M20 20 MEQ tablet TAKE 1 TABLET (20 MEQ TOTAL) BY MOUTH DAILY AS NEEDED (TAKE WITH LASIX). 90 tablet 1  . levothyroxine (SYNTHROID) 25 MCG tablet TAKE 1 TABLET BY MOUTH BEFORE BREAKFAST 2-3 HOURS BEFORE TAKING ANY OTHER MEDICATION OR SUPPLEMENT 90 tablet 3  .  lidocaine (LIDODERM) 5 % Place 1 patch onto the skin daily. Remove & Discard patch within 12 hours. 30 patch 0  . Multiple Vitamins-Minerals (SUPER THERA VITE M) TABS Take by mouth.    Marland Kitchen NIFEdipine (PROCARDIA) 10 MG capsule TAKE 1 CAPSULE BY MOUTH 2 TIMES DAILY. TAKE 30 MINS BEFORE MEALS UP TO 1X PER DAY (Patient not taking: Reported on 01/31/2020) 180 capsule 1  . nitroGLYCERIN (NITROSTAT) 0.4 MG SL tablet Place 1 tablet (0.4 mg total) under the tongue every 5 (five) minutes as needed for chest pain. 25 tablet 4  . OLANZapine (ZYPREXA) 10 MG tablet TAKE 1 TABLET BY MOUTH EVERYDAY AT BEDTIME 90 tablet 0  . ondansetron (ZOFRAN) 4 MG tablet Take 1 tablet (4 mg total) by mouth 2 (two) times daily as needed for nausea or vomiting. 30 tablet 0  . pantoprazole (PROTONIX) 40 MG tablet TAKE 1 TABLET BY MOUTH EVERY DAY 90 tablet 1  . tiZANidine (ZANAFLEX) 4 MG tablet Take 1 tablet (4 mg total) by mouth 2 (two) times daily as needed for muscle spasms. 60 tablet 1   No current facility-administered medications for this visit.    Allergies as of 02/07/2020 - Review Complete 02/07/2020  Allergen Reaction Noted  . Abilify [aripiprazole] Other (See Comments) 09/20/2013  . Augmentin [amoxicillin-pot clavulanate] Diarrhea and Nausea And Vomiting 09/20/2013  . Bactrim [sulfamethoxazole-trimethoprim] Diarrhea 09/20/2013  . Ceftin [cefuroxime axetil] Diarrhea 09/20/2013  . Coconut oil  10/19/2015  . Diclofenac Other (See Comments) 08/01/2014  . Dye fdc red [red dye]  01/22/2016  . Indocin [indomethacin] Other (See Comments) 09/20/2013  . Linaclotide Diarrhea 12/09/2014  . Lisinopril Diarrhea and Other (See Comments) 09/20/2013  . Morphine Other (See Comments) 12/09/2014  . Morphine and related Other (See Comments) 09/20/2013  . Simvastatin Other (See Comments) 08/01/2014  . Sulfa antibiotics Diarrhea 12/09/2014  . Sulfamethoxazole-trimethoprim Diarrhea 12/09/2014  . Wellbutrin [bupropion] Other (See Comments)  09/20/2013  . Zetia [ezetimibe]  09/18/2017  . Quetiapine fumarate Palpitations 08/01/2014  . Quetiapine fumarate Palpitations 08/16/2014    ROS:  General: Negative for anorexia, weight loss, fever, chills, fatigue, weakness. ENT: Negative for hoarseness, difficulty swallowing , nasal congestion. CV: Negative for chest pain, angina, palpitations, dyspnea on exertion, peripheral edema.  Respiratory: Negative for dyspnea at rest, dyspnea on exertion, cough, sputum, wheezing.  GI: See history of present illness. GU:  Negative for dysuria, hematuria, urinary incontinence, urinary frequency, nocturnal urination.  Endo: Negative for unusual weight change.    Physical Examination:   BP 113/72   Pulse 76   Temp 98.6 F (37 C)   Ht _0  (1.676 m)   Wt 229 lb 6.4 oz (104.1 kg)   BMI 37.03 kg/m   General: Well-nourished, well-developed in no acute distress.  Eyes: No icterus. Conjunctivae pink. Abdomen: Bowel sounds  are normal, nontender, nondistended, no hepatosplenomegaly or masses, no abdominal bruits or hernia , no rebound or guarding.   Neuro: Alert and oriented x 3.  Grossly intact. Skin: Warm and dry, no jaundice.   Psych: Alert and cooperative, normal mood and affect.   Imaging Studies: No results found.  Assessment and Plan:   Valerie Vazquez is a 64 y.o. y/o female here to see me today for abdominal pain and constipation.  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  She wishes not to try MiraLAX but she has difficulty with drinking medications she says.  Plan 1.  Trial of Linzess 145 mcg.  If no better will increase the dose. Dr [name]  MD,MRCP [facility]) Follow up in 2 to 3 weeks telephone visit

## 2020-02-07 NOTE — Telephone Encounter (Signed)
I called and spoke with the patient and informed her to take her rosuvastatin daily and she is scheduled for labs.  Valerie Vazquez,cma

## 2020-02-08 ENCOUNTER — Encounter: Payer: Self-pay | Admitting: Family Medicine

## 2020-02-14 DIAGNOSIS — Z981 Arthrodesis status: Secondary | ICD-10-CM | POA: Diagnosis not present

## 2020-02-14 DIAGNOSIS — M545 Low back pain: Secondary | ICD-10-CM | POA: Diagnosis not present

## 2020-02-14 DIAGNOSIS — M47812 Spondylosis without myelopathy or radiculopathy, cervical region: Secondary | ICD-10-CM | POA: Diagnosis not present

## 2020-02-14 DIAGNOSIS — G8929 Other chronic pain: Secondary | ICD-10-CM | POA: Diagnosis not present

## 2020-02-14 IMAGING — DX DG CERVICAL SPINE COMPLETE 4+V
6 series · 6 of 6 positions shown · non-contrast
Comparison: 03/28/2017

CLINICAL DATA: Chronic neck pain with radiation into the right arm,
initial encounter

EXAM:
CERVICAL SPINE - COMPLETE 4+ VIEW

[cervical spine ap]
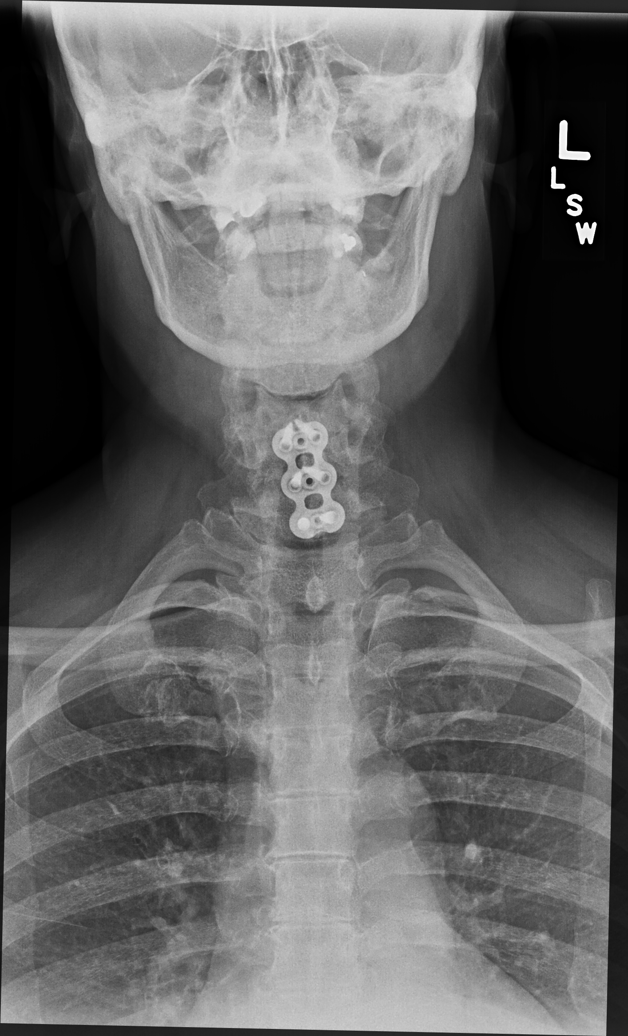

[cervical spine oblique (1 of 2)]
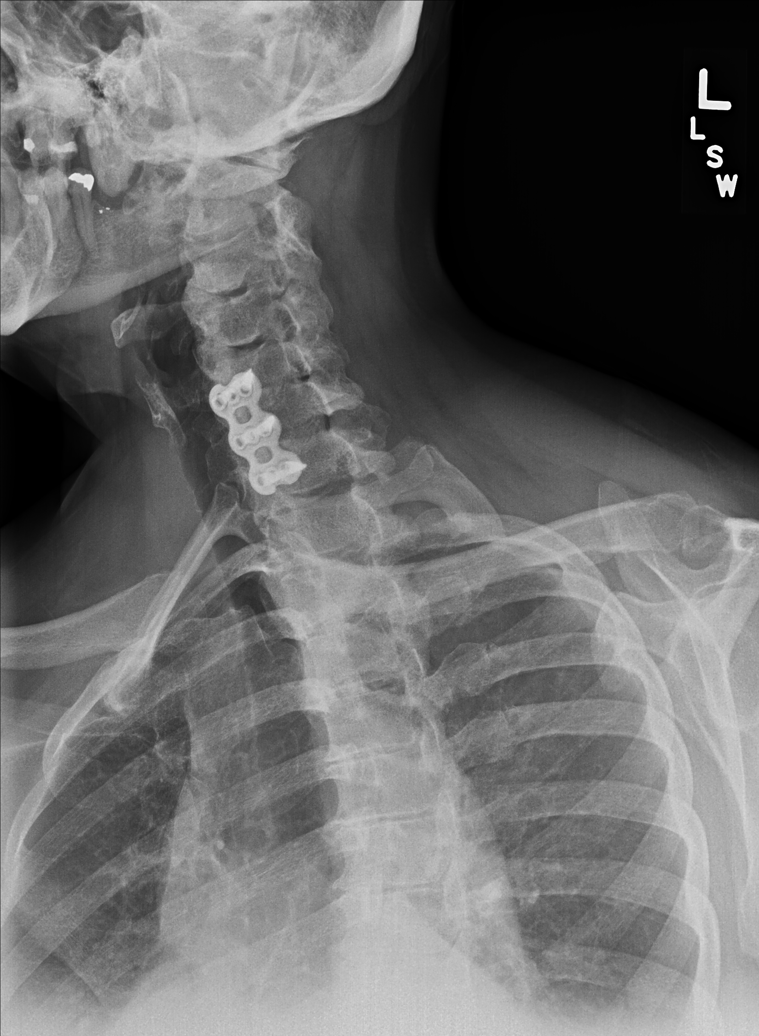

[cervical spine oblique (2 of 2)]
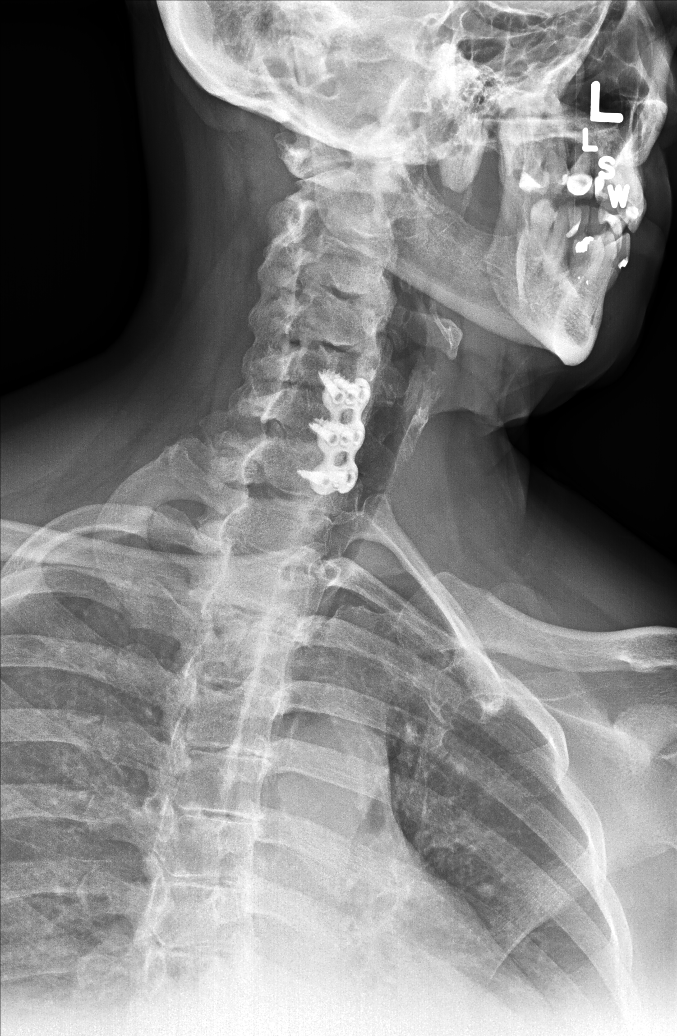

[cervical spine lat]
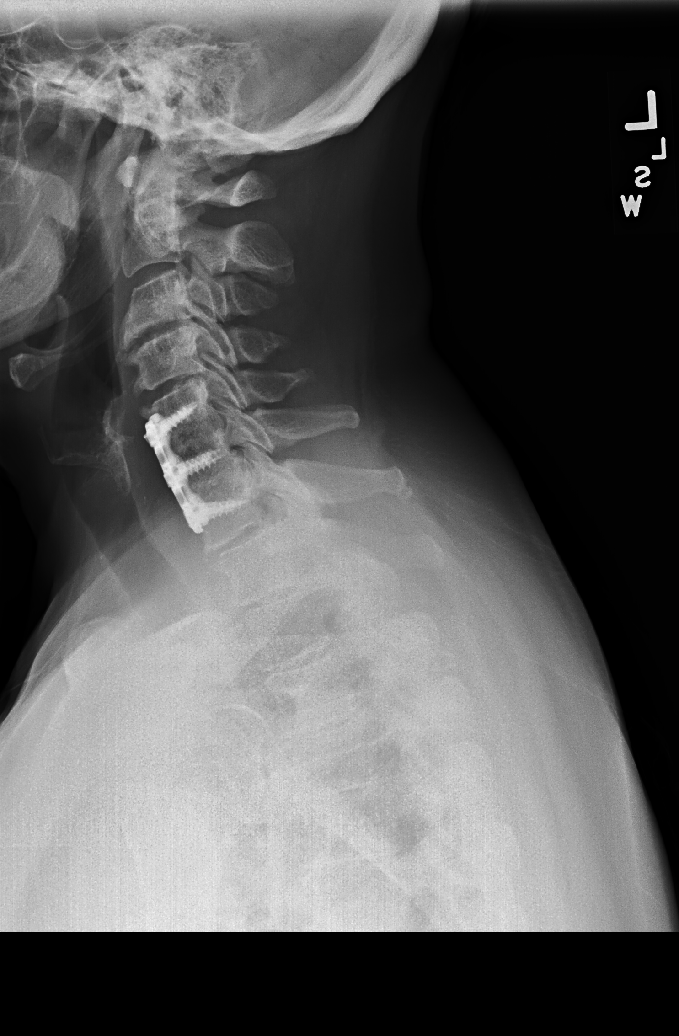

[swimmers lat]
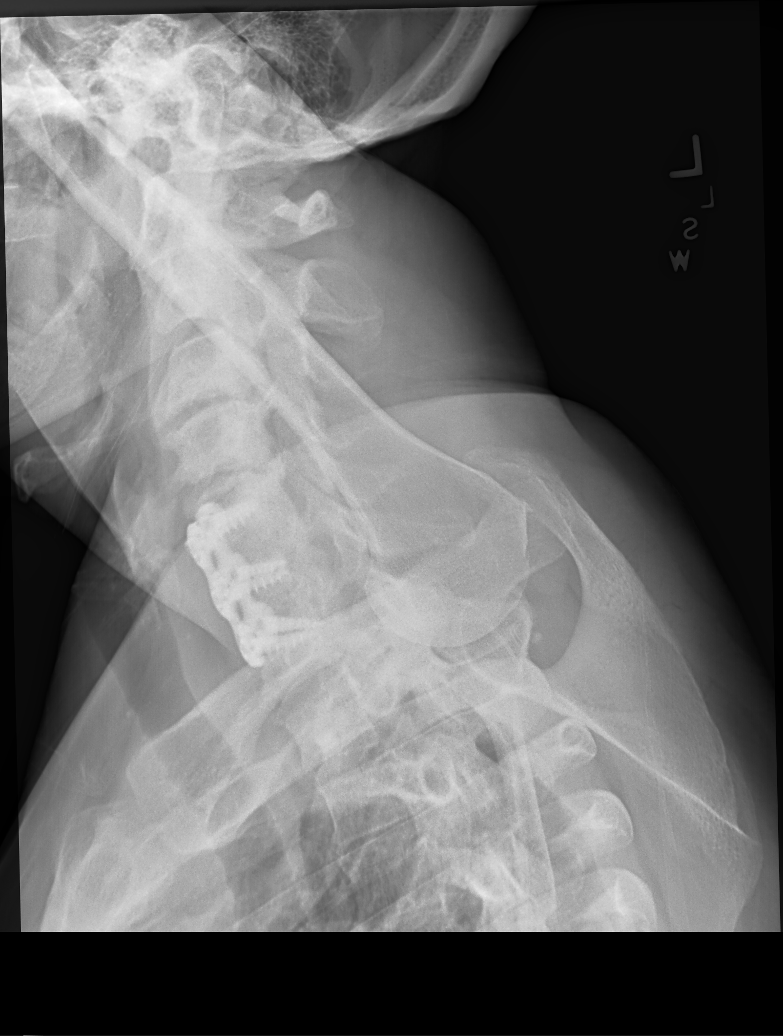

[cervical spine open mouth ap]
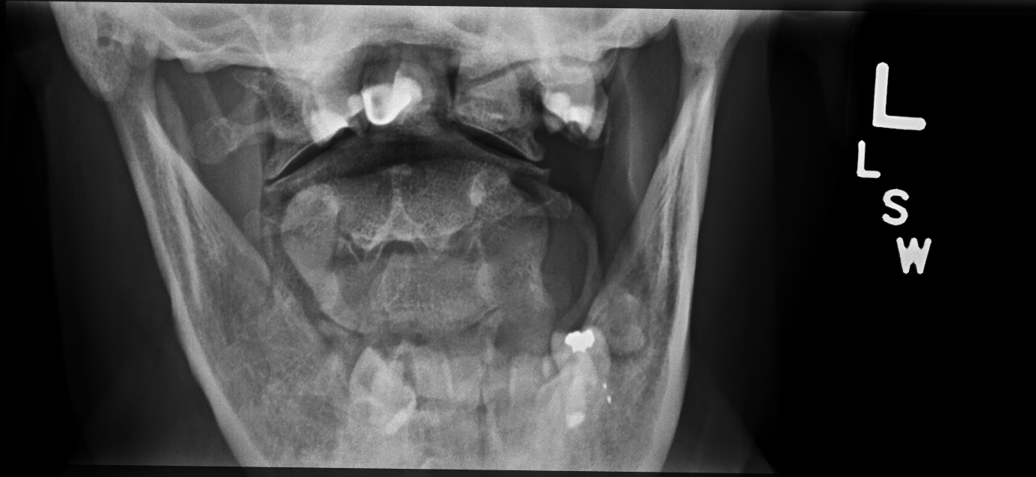

[6 of 6 positions shown; findings below may reference images not displayed]

FINDINGS: Seven cervical segments are well visualized. Prior fusion at C5-6
and C6-7 is again identified and stable. Disc space narrowing and
osteophytic changes are noted from C3-C5. No soft tissue abnormality
is seen. The upper chest is within normal limits. The odontoid is
unremarkable.
IMPRESSION: Postsurgical and degenerative changes stable from the previous exam.

## 2020-02-14 IMAGING — DX DG SHOULDER 2+V*R*
3 series · 3 of 3 positions shown · non-contrast
Comparison: None.

CLINICAL DATA: Shoulder pain and decreased range of motion

EXAM:
RIGHT SHOULDER - 2+ VIEW

[shoulder (grashey) ap]
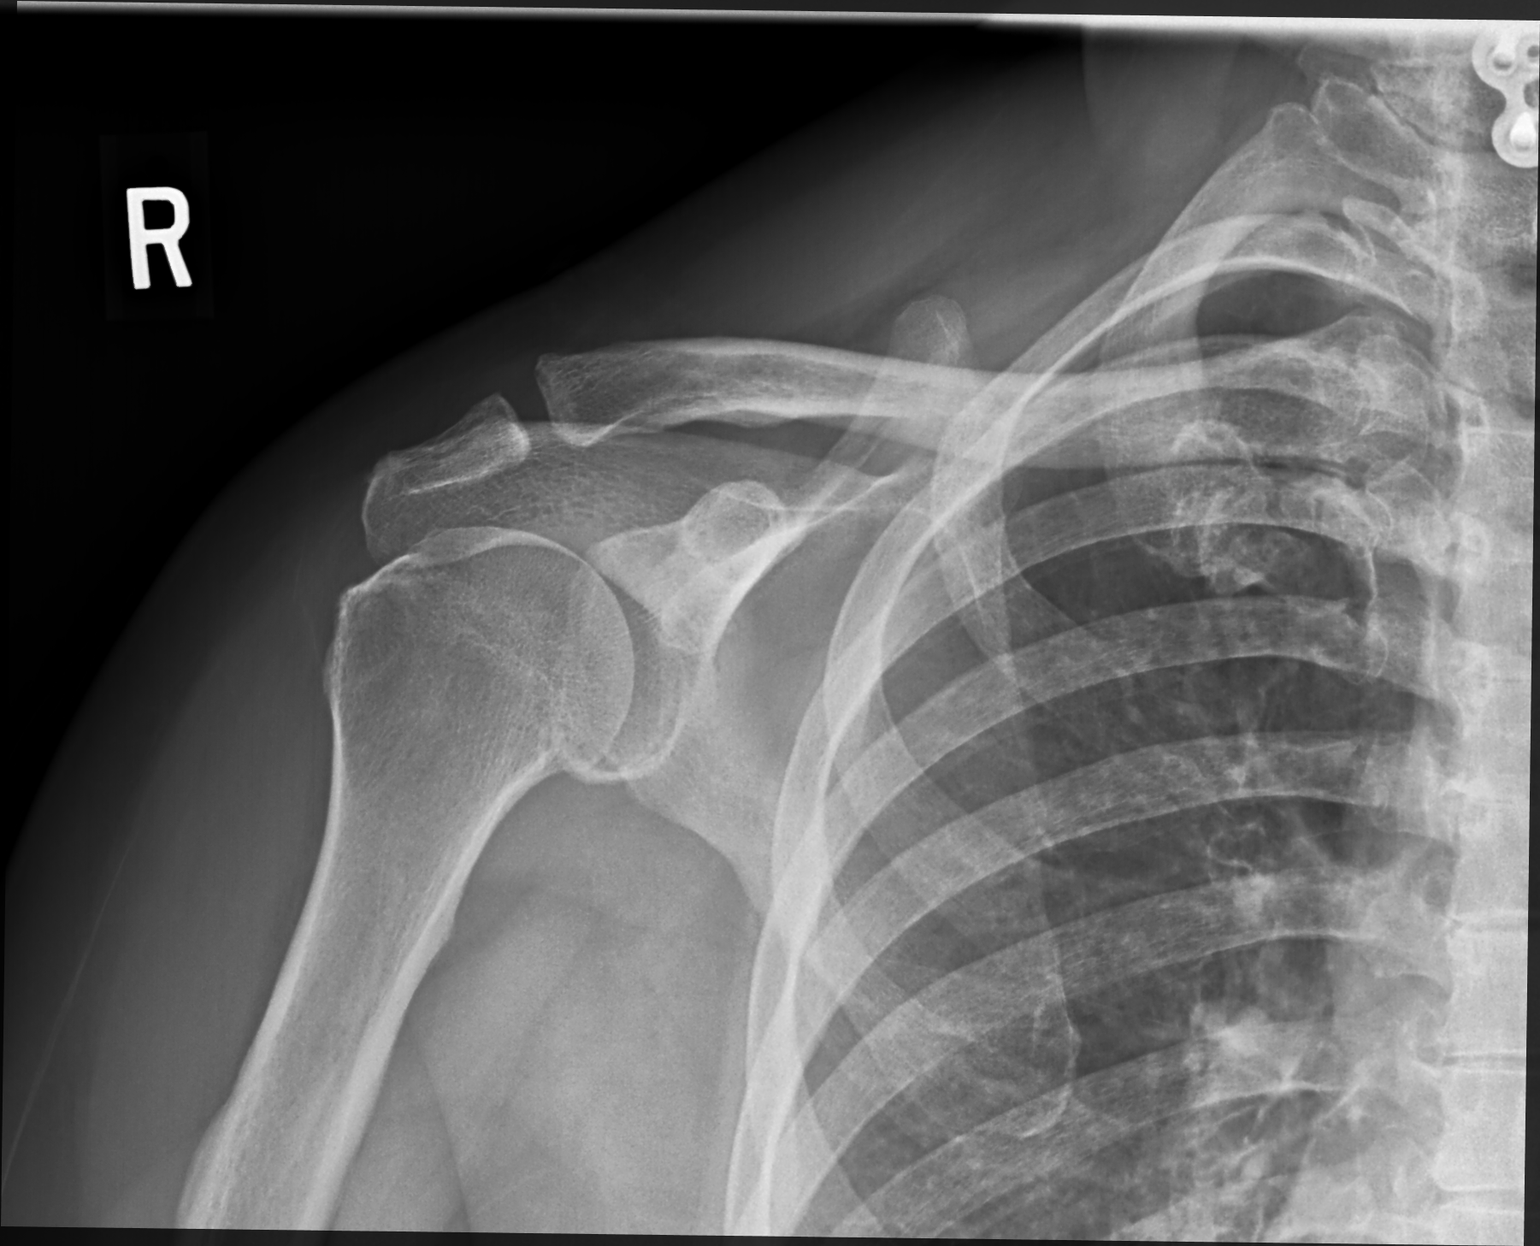

[shoulder y view]
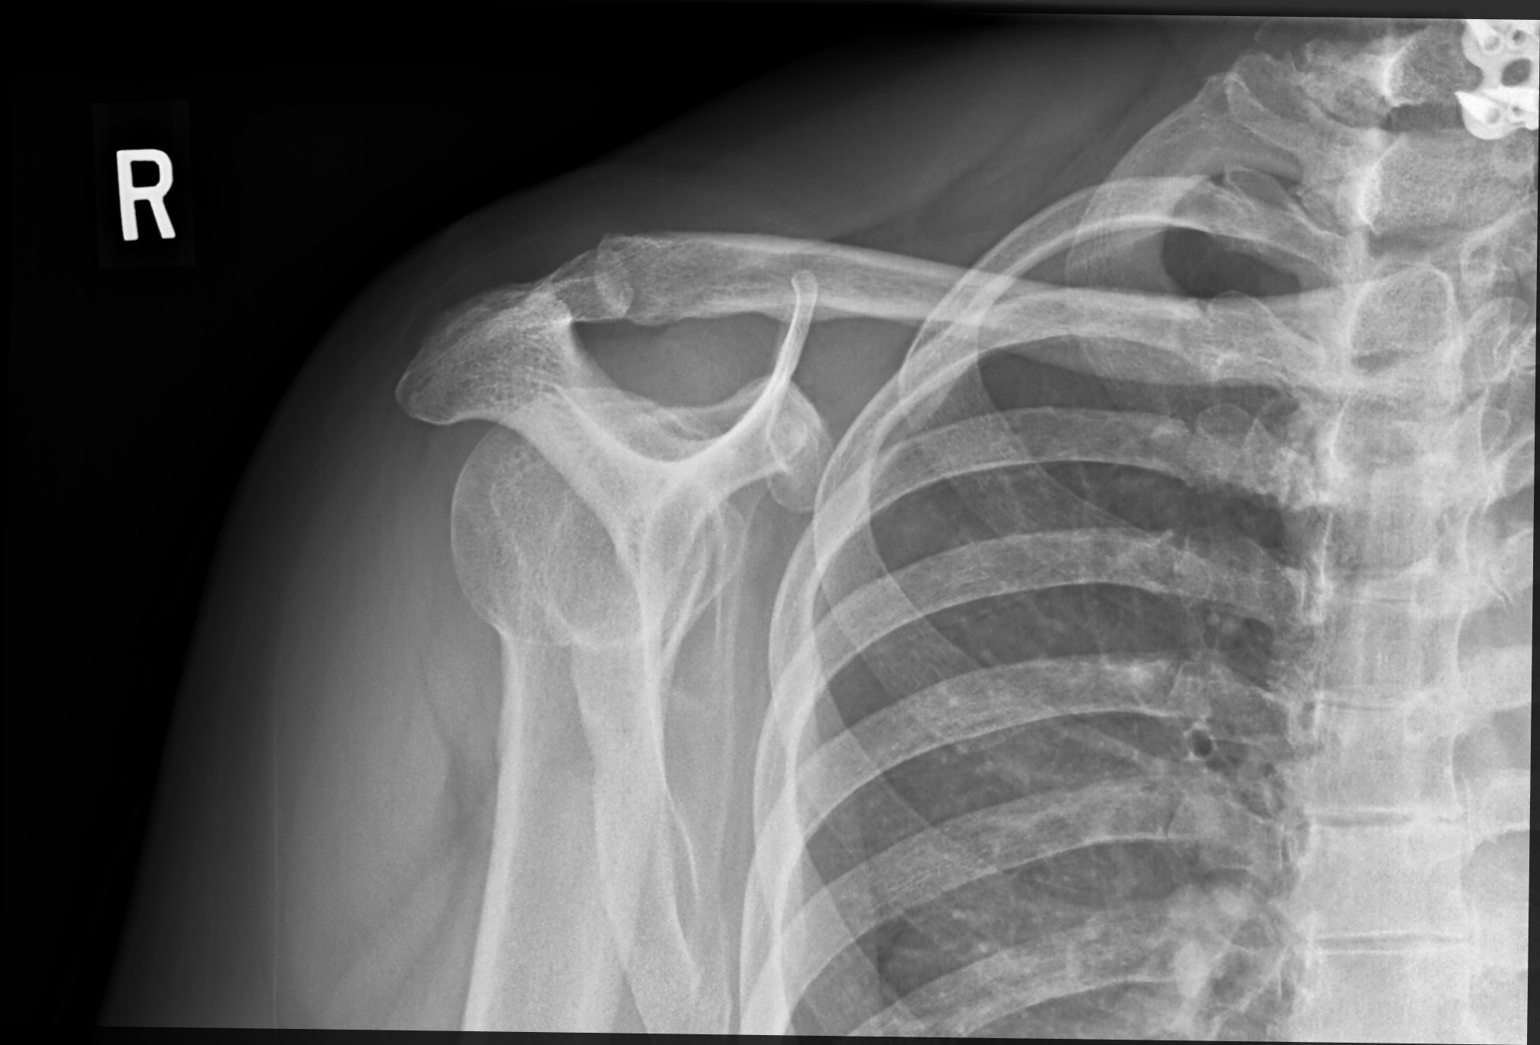

[shoulder axillary pa]
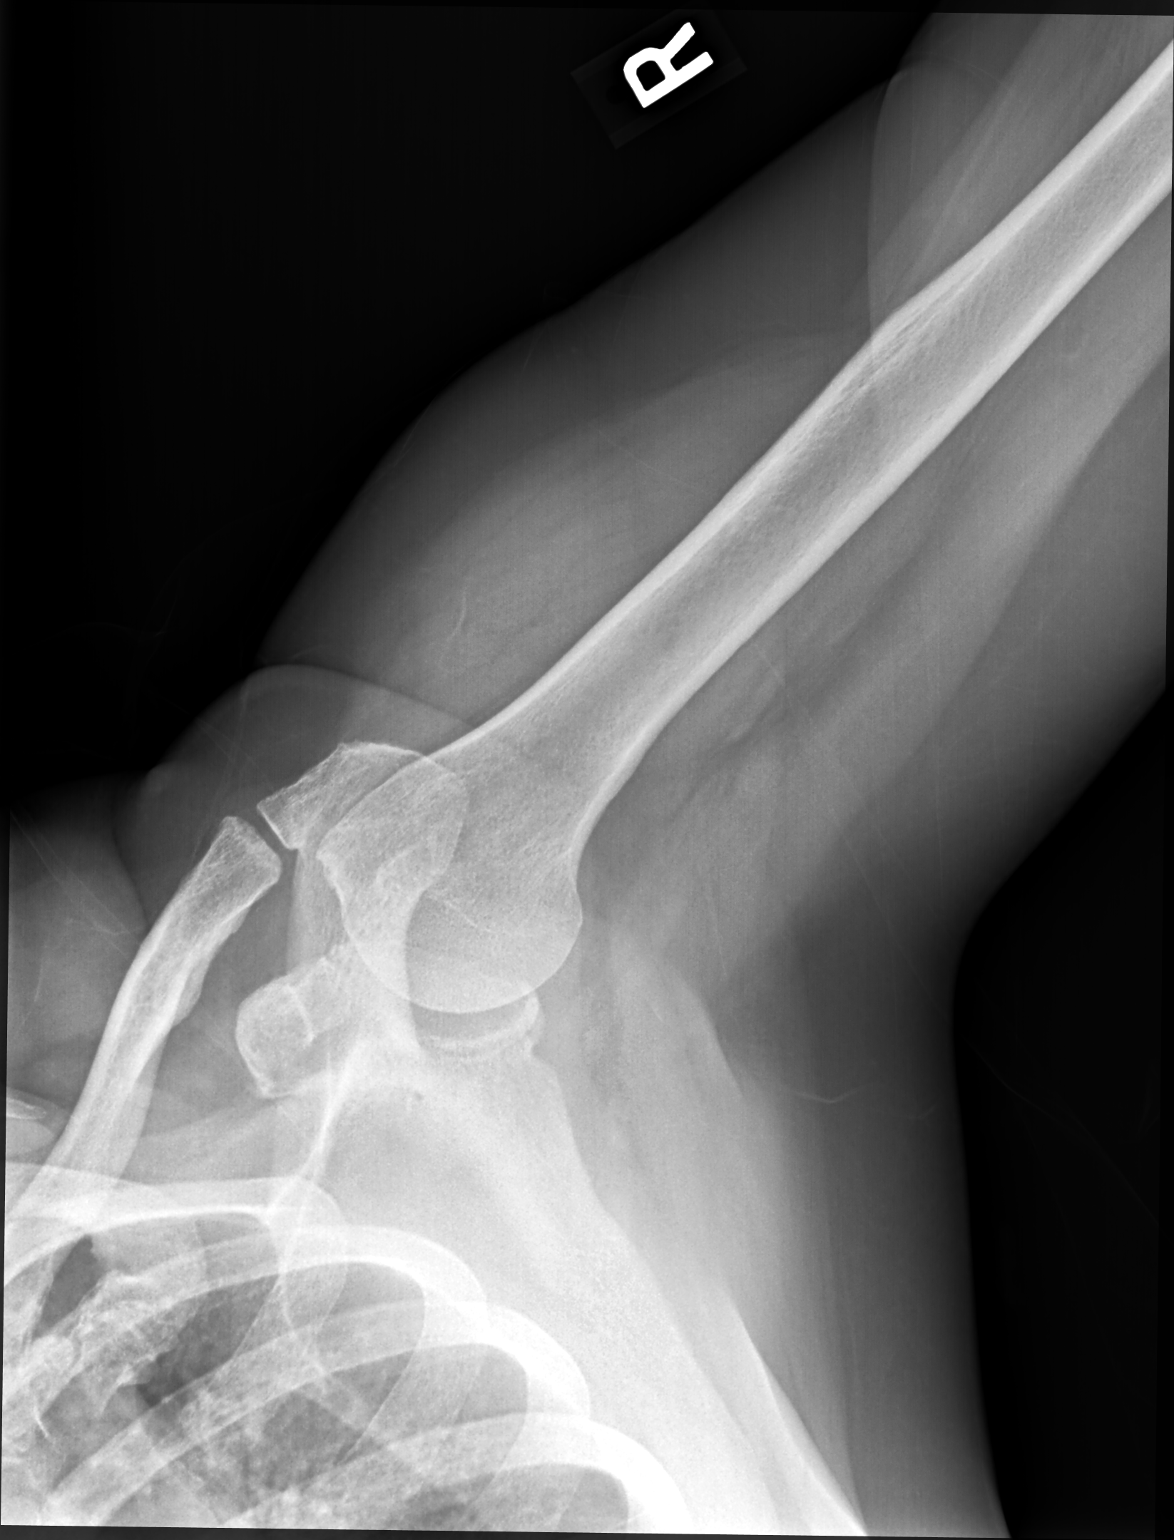

[3 of 3 positions shown; findings below may reference images not displayed]

FINDINGS: There is no evidence of fracture or dislocation. There is no
evidence of arthropathy or other focal bone abnormality. Soft
tissues are unremarkable.
IMPRESSION: No acute abnormality noted.

## 2020-02-15 ENCOUNTER — Other Ambulatory Visit: Payer: Self-pay | Admitting: Nurse Practitioner

## 2020-02-15 ENCOUNTER — Telehealth: Payer: Self-pay

## 2020-02-15 DIAGNOSIS — Z86711 Personal history of pulmonary embolism: Secondary | ICD-10-CM

## 2020-02-15 DIAGNOSIS — M47812 Spondylosis without myelopathy or radiculopathy, cervical region: Secondary | ICD-10-CM

## 2020-02-15 DIAGNOSIS — G8929 Other chronic pain: Secondary | ICD-10-CM

## 2020-02-16 ENCOUNTER — Other Ambulatory Visit: Payer: Self-pay | Admitting: Family Medicine

## 2020-02-16 DIAGNOSIS — R1013 Epigastric pain: Secondary | ICD-10-CM

## 2020-02-16 DIAGNOSIS — F411 Generalized anxiety disorder: Secondary | ICD-10-CM

## 2020-02-16 DIAGNOSIS — R110 Nausea: Secondary | ICD-10-CM

## 2020-02-16 DIAGNOSIS — F250 Schizoaffective disorder, bipolar type: Secondary | ICD-10-CM

## 2020-02-20 NOTE — Telephone Encounter (Signed)
Please contact the patient and let her know that I would like to have her follow-up with hematology to discuss her blood thinner and a possible bridge with lovenox if she is to have injections or spinal surgery. I can refer her back to them if she is willing to see them.

## 2020-02-24 ENCOUNTER — Telehealth: Payer: Self-pay

## 2020-02-24 NOTE — Telephone Encounter (Signed)
Patient called office back to speak with Gae Bon.

## 2020-02-24 NOTE — Telephone Encounter (Signed)
I called the patient and LVM for the patien tto call back for a message fromt he provider.  Valerie Vazquez,cma

## 2020-02-24 NOTE — Telephone Encounter (Signed)
Referred.

## 2020-02-24 NOTE — Telephone Encounter (Signed)
I called the patient and I informed her that she would need to see hematology to do a bridge from her blood thinner to Lovenox, she would like the referral to hematology. Valerie Vazquez,cma

## 2020-02-25 ENCOUNTER — Other Ambulatory Visit: Payer: Self-pay | Admitting: Student in an Organized Health Care Education/Training Program

## 2020-02-25 DIAGNOSIS — M542 Cervicalgia: Secondary | ICD-10-CM

## 2020-02-25 DIAGNOSIS — G894 Chronic pain syndrome: Secondary | ICD-10-CM

## 2020-02-27 ENCOUNTER — Ambulatory Visit: Payer: Medicare HMO | Admitting: Podiatry

## 2020-02-27 ENCOUNTER — Other Ambulatory Visit: Payer: Self-pay | Admitting: Family Medicine

## 2020-02-27 ENCOUNTER — Encounter: Payer: Self-pay | Admitting: Podiatry

## 2020-02-27 ENCOUNTER — Other Ambulatory Visit: Payer: Self-pay

## 2020-02-27 VITALS — BP 93/57 | HR 74

## 2020-02-27 DIAGNOSIS — L03031 Cellulitis of right toe: Secondary | ICD-10-CM | POA: Diagnosis not present

## 2020-02-27 DIAGNOSIS — E119 Type 2 diabetes mellitus without complications: Secondary | ICD-10-CM | POA: Diagnosis not present

## 2020-02-27 NOTE — Progress Notes (Signed)
This patient returns to my office for at risk foot care.  This patient requires this care by a professional since this patient will be at risk due to having diabetes and history of DVT.    She says she has an ingrown toenail about 4 weeks ago which she  Kept working on herself.  She says it is better now with no drainage or pain. This patient is diabetic.  This patient presents for at risk foot care today and an evaluation of her ingrown toenail right foot.  General Appearance  Alert, conversant and in no acute stress.  Vascular  Dorsalis pedis and posterior tibial  pulses are palpable  bilaterally.  Capillary return is within normal limits  bilaterally. Temperature is within normal limits  bilaterally.  Neurologic  Senn-Weinstein monofilament wire test within normal limits  bilaterally. Muscle power within normal limits bilaterally. Numbness in the 1st interspace left foot.  Nails No evidence of thick mycotic nails.    from hallux to fifth toes bilaterally. No evidence of bacterial infection or drainage bilaterally. Mild incurvation lateral border right great toenail  Orthopedic  No limitations of motion  feet .  No crepitus or effusions noted.  No bony pathology or digital deformities noted. Mild  HAV  B/L.  Adductovarus fifth toes  B/L.  Skin  normotropic skin with no porokeratosis noted bilaterally.  No signs of infections or ulcers noted.     Onychomycosis  Pain in right toes  Pain in left toes  Consent was obtained for treatment procedures.  Diabetic foot exam reveals no vascular or neurologic pathology.  Told her about surgical correction which can be done in the near future.     Return office visit    4 weeks.                 Told patient to return for periodic foot care and evaluation due to potential at risk complications.   Gardiner Barefoot DPM

## 2020-02-28 ENCOUNTER — Ambulatory Visit
Admission: RE | Admit: 2020-02-28 | Discharge: 2020-02-28 | Disposition: A | Discharge status: Home / Self Care | Payer: Medicare HMO | Source: Ambulatory Visit | Attending: Nurse Practitioner | Admitting: Nurse Practitioner

## 2020-02-28 DIAGNOSIS — G8929 Other chronic pain: Secondary | ICD-10-CM

## 2020-02-28 DIAGNOSIS — M47812 Spondylosis without myelopathy or radiculopathy, cervical region: Secondary | ICD-10-CM | POA: Diagnosis not present

## 2020-02-28 DIAGNOSIS — M545 Low back pain: Secondary | ICD-10-CM | POA: Insufficient documentation

## 2020-02-29 ENCOUNTER — Other Ambulatory Visit: Payer: Self-pay | Admitting: Family Medicine

## 2020-02-29 DIAGNOSIS — E119 Type 2 diabetes mellitus without complications: Secondary | ICD-10-CM

## 2020-03-01 NOTE — Progress Notes (Signed)
Patient prescreened for appointment. Patient is wondering how she going to get injections from pain medications because they told her that she needs to be off blood thinners for 3 days including a bridge, and she feels like it's too high risk.

## 2020-03-02 ENCOUNTER — Other Ambulatory Visit: Payer: Self-pay

## 2020-03-02 ENCOUNTER — Inpatient Hospital Stay: Payer: Medicare HMO | Attending: Oncology | Admitting: Oncology

## 2020-03-02 ENCOUNTER — Inpatient Hospital Stay: Payer: Medicare HMO

## 2020-03-02 VITALS — BP 117/75 | HR 77 | Temp 98.9°F | Wt 225.7 lb

## 2020-03-02 DIAGNOSIS — Z809 Family history of malignant neoplasm, unspecified: Secondary | ICD-10-CM | POA: Insufficient documentation

## 2020-03-02 DIAGNOSIS — E7800 Pure hypercholesterolemia, unspecified: Secondary | ICD-10-CM | POA: Diagnosis not present

## 2020-03-02 DIAGNOSIS — Z86718 Personal history of other venous thrombosis and embolism: Secondary | ICD-10-CM

## 2020-03-02 DIAGNOSIS — I10 Essential (primary) hypertension: Secondary | ICD-10-CM | POA: Insufficient documentation

## 2020-03-02 DIAGNOSIS — Z87891 Personal history of nicotine dependence: Secondary | ICD-10-CM | POA: Diagnosis not present

## 2020-03-02 DIAGNOSIS — E785 Hyperlipidemia, unspecified: Secondary | ICD-10-CM | POA: Insufficient documentation

## 2020-03-02 DIAGNOSIS — E119 Type 2 diabetes mellitus without complications: Secondary | ICD-10-CM | POA: Diagnosis not present

## 2020-03-02 DIAGNOSIS — Z801 Family history of malignant neoplasm of trachea, bronchus and lung: Secondary | ICD-10-CM | POA: Diagnosis not present

## 2020-03-02 DIAGNOSIS — F251 Schizoaffective disorder, depressive type: Secondary | ICD-10-CM | POA: Diagnosis not present

## 2020-03-02 DIAGNOSIS — Z86711 Personal history of pulmonary embolism: Secondary | ICD-10-CM | POA: Insufficient documentation

## 2020-03-02 DIAGNOSIS — R69 Illness, unspecified: Secondary | ICD-10-CM | POA: Diagnosis not present

## 2020-03-02 DIAGNOSIS — K589 Irritable bowel syndrome without diarrhea: Secondary | ICD-10-CM | POA: Diagnosis not present

## 2020-03-02 DIAGNOSIS — Z9079 Acquired absence of other genital organ(s): Secondary | ICD-10-CM | POA: Diagnosis not present

## 2020-03-02 DIAGNOSIS — G4730 Sleep apnea, unspecified: Secondary | ICD-10-CM | POA: Insufficient documentation

## 2020-03-02 DIAGNOSIS — Z7984 Long term (current) use of oral hypoglycemic drugs: Secondary | ICD-10-CM | POA: Insufficient documentation

## 2020-03-02 DIAGNOSIS — K219 Gastro-esophageal reflux disease without esophagitis: Secondary | ICD-10-CM | POA: Insufficient documentation

## 2020-03-02 DIAGNOSIS — Z7901 Long term (current) use of anticoagulants: Secondary | ICD-10-CM | POA: Diagnosis not present

## 2020-03-02 DIAGNOSIS — I252 Old myocardial infarction: Secondary | ICD-10-CM | POA: Insufficient documentation

## 2020-03-02 DIAGNOSIS — Z79899 Other long term (current) drug therapy: Secondary | ICD-10-CM | POA: Diagnosis not present

## 2020-03-03 NOTE — Progress Notes (Signed)
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  INTERVAL HISTORY: Patient is a 64 year old female who was last evaluated in clinic greater than 4 years ago.  She is referred back for anticoagulation management surrounding spinal injection.  She currently feels well and is asymptomatic.  She tolerates Eliquis well without significant side effects.  She has no neurologic complaints.  She continues to have chronic neck and back pain.  She has a good appetite and denies weight loss.  She has no chest pain, shortness of breath, cough, or hemoptysis.  She denies any nausea, vomiting, constipation, or diarrhea.  She has no urinary complaints.  Patient otherwise feels well and offers no further specific complaints today.  REVIEW OF SYSTEMS:   Review of Systems  Constitutional: Negative.  Negative for fever, malaise/fatigue and weight loss.  Respiratory: Negative.  Negative for cough, hemoptysis and shortness of breath.   Cardiovascular: Negative.  Negative for chest pain and leg swelling.  Gastrointestinal: Negative.  Negative for abdominal pain.  Genitourinary: Negative.  Negative for dysuria.  Musculoskeletal: Positive for back pain and neck pain.  Skin: Negative.  Negative for rash.  Neurological: Negative.  Negative for dizziness, focal weakness, weakness and headaches.  Psychiatric/Behavioral: Negative.  The patient is not nervous/anxious.     As per HPI. Otherwise, a complete review of systems is negative.  PAST MEDICAL HISTORY: Past Medical History:  Diagnosis Date  . Acute right-sided low back pain with right-sided sciatica 10/23/2017  .  Anemia   . Bilateral renal cysts   . CAD (coronary artery disease) 09/28/2016  . Chest pain 10/14/2015  . Chronic back pain    "upper and lower" (09/19/2014)  . Chronic shoulder pain (Location of Primary Source of Pain) (Left) 01/29/2016  . Colitis 04/08/2016  . Coronary vasospasm ([place]) 12/09/2014  . Depression    "major" (09/19/2014)  . DVT (deep venous thrombosis) ([place])   . Esophageal spasm   . Essential hypertension 10/28/2013  . Gallstones   . Gastritis   . Gastroenteritis   . GERD (gastroesophageal reflux disease)   . Headache    "couple /month lately" (09/19/2014)  . Heart disease 12/09/2016  . High blood pressure   . High cholesterol   . History of blood transfusion 1985   related to hysterectomy  . History of nuclear stress test    Myoview 1/19:  EF 65, no ischemia or scar  . History of pulmonary embolus (PE) 09/19/2014  . Hx of cervical spine surgery 01/29/2016  . 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 Rule out Paget's disease of the bone with blood work.   . Hyperlipidemia 10/28/2013  . Hypertension   . IBS (irritable bowel syndrome)   . Lumbar central spinal stenosis (moderate at L4-5; mild at L2-3 and L3-4) 01/29/2016  . Migraine    "a few times/yr" (09/19/2014)  . Myocardial infarction ([place]) 10/2010 X 3   "while hospitalized"  . Neck pain 01/29/2016  . Osteoarthritis    "qwhere; mostly around my joints" (09/19/2014)  . Pleural effusion, bilateral 07/31/2015  . Pneumonia 04/2014  . PTSD (post-traumatic stress disorder)   . Pulmonary embolism ([place]) 04/2001; 09/19/2014   "after  gallbladder OR; "  . Schizoaffective disorder ([place])   . Schizoaffective disorder, depressive type ([place]) 08/28/2016  . Sleep apnea    "mild" (09/19/2014)  . Snoring    a. sleep study 5/16: No OSA  . SOB (shortness of breath) 07/31/2015  . Spinal stenosis   . Spondylosis   . Suicide attempt ([place]) 08/27/2016  . Type II diabetes mellitus ([place])    "dx'd in 2007; lost weight; no RX  for ~ 4 yr now" (09/19/2014)  . Wheezing 02/25/2017    PAST SURGICAL HISTORY: Past Surgical History:  Procedure Laterality Date  . ABDOMINAL HYSTERECTOMY  1985  . ANTERIOR CERVICAL DECOMP/DISCECTOMY FUSION  10/2012   in [place] C5-C7   . APPENDECTOMY  1985  . BACK SURGERY    . BREAST CYST EXCISION Right   . BREAST EXCISIONAL BIOPSY Right YRS AGO   NEG  . CARDIAC CATHETERIZATION   2000's; 2009  . CHOLECYSTECTOMY  2002  . COLONOSCOPY WITH PROPOFOL N/A 12/12/2016   Procedure: COLONOSCOPY WITH PROPOFOL;  Surgeon: [name], MD;  Location: [facility] ENDOSCOPY;  Service: Endoscopy;  Laterality: N/A;  . COLONOSCOPY WITH PROPOFOL N/A 02/17/2017   Procedure: COLONOSCOPY WITH PROPOFOL;  Surgeon: [name], MD;  Location[facility] ENDOSCOPY;  Service: Gastroenterology;  Laterality: N/A;  . ESOPHAGOGASTRODUODENOSCOPY (EGD) WITH PROPOFOL N/A 02/17/2017   Procedure: ESOPHAGOGASTRODUODENOSCOPY (EGD) WITH PROPOFOL;  Surgeon: [name], MD;  Location: [facility] ENDOSCOPY;  Service: Gastroenterology;  Laterality: N/A;  . ESOPHAGOGASTRODUODENOSCOPY (EGD) WITH PROPOFOL N/A 03/07/2019   Procedure: ESOPHAGOGASTRODUODENOSCOPY (EGD) WITH PROPOFOL;  Surgeon: [name], MD;  Location[facility] END[facility]COPY;  Service: Gastroenterology;  Laterality: N/A;  . EXCISION/RELEASE BURSA HIP Right 12/1984  . HERNIA REPAIR  1985  . LEFT HEART CATHETERIZATION WITH CORONARY ANGIOGRAM N/A 12/09/2014   Procedure: LEFT HEART CATHETERIZATION WITH CORONARY ANGIOGRAM;  Surgeon: Burnell Blanks, MD;  Location: [facility] CATH LAB;  Service: Cardiovascular;  Laterality: N/A;  . TONSILLECTOMY AND ADENOIDECTOMY  ~ 1968  . TOTAL HIP ARTHROPLASTY Left 04-06-2014  . UMBILICAL HERNIA REPAIR  1985  . VENA CAVA FILTER PLACEMENT  03/2014    FAMILY HISTORY: Family History  Problem Relation Age of Onset  . Stroke Mother        Deceased  . Lung cancer Father        Deceased  . Healthy Daughter   . Healthy Son        x 2  . Other Son        Suicide  .  Cancer Brother   . Heart attack Neg Hx     ADVANCED DIRECTIVES (Y/N):  N  HEALTH MAINTENANCE: Social History   Tobacco Use  . Smoking status: Former Smoker    Packs/day: 1.50    Years: 10.00    Pack years: 15.00    Types: Cigarettes    Quit date: 04/12/1979    Years since quitting: 40.9  . Smokeless tobacco: Never Used  Vaping Use  . Vaping Use: Never used  Substance Use Topics  . Alcohol use: No    Alcohol/week: 0.0 standard drinks  . Drug use: No     Colonoscopy:  PAP:  Bone density:  Lipid panel:  Allergies  Allergen Reactions  . Abilify [Aripiprazole] Other (See Comments)    Jerking movement, slow motor skills  . Augmentin [Amoxicillin-Pot Clavulanate] Diarrhea and Nausea And Vomiting  . Bactrim [Sulfamethoxazole-Trimethoprim] Diarrhea  . Ceftin [Cefuroxime Axetil] Diarrhea  . Coconut Oil     Rash   . Diclofenac Other (  See Comments)     Muscle Pain  . Dye Fdc Red [Red Dye]     "hair dye"  . Indocin [Indomethacin] Other (See Comments)    Migraines   . Linaclotide Diarrhea  . Lisinopril Diarrhea and Other (See Comments)    Other reaction(s): Dizziness, Vomiting  . Morphine Other (See Comments)    Chest Tightness  . Morphine And Related Other (See Comments)    Chest tightness   . Simvastatin Other (See Comments)    Muscle Pain  . Sulfa Antibiotics Diarrhea  . Sulfamethoxazole-Trimethoprim Diarrhea  . Wellbutrin [Bupropion] Other (See Comments)    Jerking movement Jerking movement Slurred speech  . Zetia [Ezetimibe]     Diarrhea   . Quetiapine Fumarate Palpitations  . Quetiapine Fumarate Palpitations    Current Outpatient Medications  Medication Sig Dispense Refill  . acetaminophen (TYLENOL) 500 MG tablet Take 1 tablet (500 mg total) by mouth every 6 (six) hours as needed. 30 tablet 0  . baclofen (LIORESAL) 10 MG tablet TAKE 1 TABLET BY MOUTH THREE TIMES A DAY AS NEEDED FOR MUSCLE SPASMS 60 tablet 1  . carvedilol (COREG) 3.125 MG tablet TAKE 1  TABLET (3.125 MG TOTAL) BY MOUTH 2 (TWO) TIMES DAILY WITH A MEAL. 180 tablet 3  . cetirizine (ZYRTEC) 10 MG tablet TAKE 1 TABLET BY MOUTH EVERY DAY 90 tablet 3  . dicyclomine (BENTYL) 20 MG tablet TAKE 1 TABLET BY MOUTH THREE TIMES A DAY AS NEEDED FOR MUSCLE SPASMS 270 tablet 0  . diphenhydrAMINE (BENADRYL) 25 MG tablet Take 1 tablet (25 mg total) by mouth every 6 (six) hours. (Patient taking differently: Take 25 mg by mouth every 8 (eight) hours as needed. ) 12 tablet 0  . ELIQUIS 5 MG TABS tablet TAKE 1 TABLET BY MOUTH TWICE A DAY 60 tablet 5  . empagliflozin (JARDIANCE) 25 MG TABS tablet Take 1 tablet (25 mg total) by mouth daily. 90 tablet 1  . escitalopram (LEXAPRO) 20 MG tablet TAKE 1 TABLET BY MOUTH EVERY DAY 90 tablet 1  . famotidine (PEPCID) 20 MG tablet TAKE 1 TABLET BY MOUTH EVERYDAY AT BEDTIME 90 tablet 0  . furosemide (LASIX) 20 MG tablet TAKE 1 TABLET (20 MG TOTAL) BY MOUTH DAILY AS NEEDED FOR EDEMA. 90 tablet 1  . gabapentin (NEURONTIN) 600 MG tablet TAKE 1 TABLET IN THE AM AND 1.5 TABLETS AT BEDTIME 225 tablet 1  . isosorbide mononitrate (IMDUR) 30 MG 24 hr tablet TAKE 1.5 TABLETS (45 MG TOTAL) BY MOUTH DAILY. (Patient taking differently: 30 mg daily. ) 135 tablet 3  . JANUVIA 100 MG tablet TAKE 1 TABLET BY MOUTH EVERY DAY 90 tablet 1  . KLOR-CON M20 20 MEQ tablet TAKE 1 TABLET (20 MEQ TOTAL) BY MOUTH DAILY AS NEEDED (TAKE WITH LASIX). 90 tablet 1  . levothyroxine (SYNTHROID) 25 MCG tablet TAKE 1 TABLET BY MOUTH BEFORE BREAKFAST 2-3 HOURS BEFORE TAKING ANY OTHER MEDICATION OR SUPPLEMENT 90 tablet 3  . lidocaine (LIDODERM) 5 % Place 1 patch onto the skin daily. Remove & Discard patch within 12 hours. 30 patch 0  . Multiple Vitamins-Minerals (SUPER THERA VITE M) TABS Take by mouth.    . nitroGLYCERIN (NITROSTAT) 0.4 MG SL tablet Place 1 tablet (0.4 mg total) under the tongue every 5 (five) minutes as needed for chest pain. 25 tablet 4  . OLANZapine (ZYPREXA) 10 MG tablet TAKE 1 TABLET  BY MOUTH EVERYDAY AT BEDTIME 90 tablet 0  . ondansetron (ZOFRAN) 4 MG tablet  TAKE 1 TABLET (4 MG TOTAL) BY MOUTH 2 (TWO) TIMES DAILY AS NEEDED FOR NAUSEA OR VOMITING. 30 tablet 0  . pantoprazole (PROTONIX) 40 MG tablet TAKE 1 TABLET BY MOUTH EVERY DAY 90 tablet 1  . rosuvastatin (CRESTOR) 20 MG tablet Take 1 tablet (20 mg total) by mouth daily. 90 tablet 1  . Alirocumab (PRALUENT) 75 MG/ML SOAJ Inject 75 mg into the skin every 14 (fourteen) days. (Patient not taking: Reported on 03/01/2020) 6 pen 1  . blood glucose meter kit and supplies KIT Dispense based on patient and insurance preference. Use once daily. Dx code E11.9. (Patient not taking: Reported on 03/01/2020) 1 each 0  . diclofenac sodium (VOLTAREN) 1 % GEL Apply 2 g topically 4 (four) times daily. Right arm/shoulder arthritis (Patient not taking: Reported on 03/01/2020) 1 Tube 2  . glucose blood test strip Check once daily, dispense per patient and insurance preference, dx code E11.9 (Patient not taking: Reported on 03/01/2020) 100 each 12   No current facility-administered medications for this visit.    OBJECTIVE: Vitals:   03/02/20 1329  BP: 117/75  Pulse: 77  Temp: 98.9 F (37.2 C)  SpO2: 97%     Body mass index is 36.43 kg/m.    ECOG FS:0 - Asymptomatic  General: Well-developed, well-nourished, no acute distress. Eyes: Pink conjunctiva, anicteric sclera. HEENT: Normocephalic, moist mucous membranes. Lungs: No audible wheezing or coughing. Heart: Regular rate and rhythm. Abdomen: Soft, nontender, no obvious distention. Musculoskeletal: No edema, cyanosis, or clubbing. Neuro: Alert, answering all questions appropriately. Cranial nerves grossly intact. Skin: No rashes or petechiae noted. Psych: Normal affect. Lymphatics: No cervical, calvicular, axillary or inguinal LAD.   LAB RESULTS:  Lab Results  Component Value Date   NA 140 01/27/2020   K 4.0 01/27/2020   CL 104 01/27/2020   CO2 27 01/27/2020   GLUCOSE 159 (H)  01/27/2020   BUN 13 01/27/2020   CREATININE 0.84 01/27/2020   CALCIUM 9.0 01/27/2020   PROT 6.9 01/27/2020   ALBUMIN 3.9 01/27/2020   AST 16 01/27/2020   ALT 16 01/27/2020   ALKPHOS 116 01/27/2020   BILITOT 0.5 01/27/2020   GFRNONAA >60 05/19/2019   GFRAA >60 05/19/2019    Lab Results  Component Value Date   WBC 7.6 01/27/2020   NEUTROABS 4.0 02/09/2019   HGB 13.0 01/27/2020   HCT 39.2 01/27/2020   MCV 94.0 01/27/2020   PLT 324.0 01/27/2020     STUDIES: MR LUMBAR SPINE WO CONTRAST  Result Date: 02/29/2020 CLINICAL DATA:  Chronic low back pain EXAM: MRI LUMBAR SPINE WITHOUT CONTRAST TECHNIQUE: Multiplanar, multisequence MR imaging of the lumbar spine was performed. No intravenous contrast was administered. COMPARISON:  2019 FINDINGS: Segmentation:  Standard. Alignment:  New trace anterolisthesis at L4-L5. Vertebrae: Vertebral body heights are maintained. There is mild marrow edema associated with the L4-L5 facets. No suspicious osseous lesion. Conus medullaris and cauda equina: Conus extends to the L2 level. Conus and cauda equina appear normal. Paraspinal and other soft tissues: Unremarkable. Disc levels: L1-L2:  Mild facet arthropathy.  No canal or foraminal stenosis. L2-L3: Moderate right and mild left facet arthropathy with ligamentum flavum infolding. No canal or foraminal stenosis. L3-L4: Moderate to marked facet arthropathy with ligamentum flavum infolding. Minor canal stenosis. No foraminal stenosis. L4-L5: New trace anterolisthesis with disc uncovering. Marked facet arthropathy with ligamentum flavum infolding. Mild to moderate canal stenosis. Narrowing of the lateral recesses. Mild right foraminal stenosis. No left foraminal stenosis. L5-S1: Moderate to marked  facet arthropathy. No canal or foraminal stenosis. IMPRESSION: Multilevel degenerative changes are similar in appearance to 2019 examination. There is significant multilevel facet arthropathy, greatest at L4-L5.  Electronically Signed   By: Macy Mis M.D.   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  PLAN:    1. 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  Patient appears to have no distinct etiology for her propensity to develop blood clots, but recommendation continues to be remaining on lifelong Eliquis. 2.  Spinal injection: Patient will require to be off anticoagulation surrounding any injection or surgery.  She will require a Lovenox bridge.  Recommend dose of Lovenox is 1 mg/kg every 12 hours.  Initiate Lovenox 4 days prior to procedure.  Discontinue Eliquis 3 days prior to procedure.  Continue Lovenox every 12 hours, but hold dosing morning of procedure and then restart Lovenox the evening of procedure.  Restart Eliquis 1 day following.  Continue both Eliquis and Lovenox for 2 days after and then discontinue Lovenox.  Continue Eliquis lifelong as recommended before.  Lovenox prescription can be given by MD performing procedure or surgery. 3.  Disposition: No follow-up is necessary.  I spent a total of 45 minutes reviewing chart data, face-to-face evaluation with the patient, counseling and coordination of care as detailed above.   Patient expressed understanding and was in agreement with this plan. She also understands that She can call clinic at any time with any questions, concerns, or complaints.    Lloyd Huger, MD   03/03/2020 8:08 AM

## 2020-03-08 ENCOUNTER — Other Ambulatory Visit: Payer: Self-pay | Admitting: Family Medicine

## 2020-03-08 DIAGNOSIS — E063 Autoimmune thyroiditis: Secondary | ICD-10-CM

## 2020-03-12 ENCOUNTER — Other Ambulatory Visit (INDEPENDENT_AMBULATORY_CARE_PROVIDER_SITE_OTHER): Payer: Medicare HMO

## 2020-03-12 ENCOUNTER — Other Ambulatory Visit: Payer: Self-pay

## 2020-03-12 DIAGNOSIS — E7800 Pure hypercholesterolemia, unspecified: Secondary | ICD-10-CM

## 2020-03-12 LAB — LIPID PANEL
Cholesterol: 169 mg/dL (ref 0–200)
HDL: 40 mg/dL (ref >39.00)
LDL Cholesterol: 110 mg/dL — ABNORMAL HIGH (ref 0–99)
NonHDL: 128.87
Total CHOL/HDL Ratio: 4
Triglycerides: 96 mg/dL (ref 0.0–149.0)
VLDL: 19.2 mg/dL (ref 0.0–40.0)

## 2020-03-20 ENCOUNTER — Encounter: Payer: Self-pay | Admitting: Family Medicine

## 2020-03-20 DIAGNOSIS — E785 Hyperlipidemia, unspecified: Secondary | ICD-10-CM

## 2020-03-21 NOTE — Telephone Encounter (Signed)
Patient called office to see about her test results.

## 2020-03-22 ENCOUNTER — Encounter (INDEPENDENT_AMBULATORY_CARE_PROVIDER_SITE_OTHER): Payer: Self-pay

## 2020-03-23 ENCOUNTER — Other Ambulatory Visit: Payer: Self-pay | Admitting: Family Medicine

## 2020-03-23 DIAGNOSIS — Z1231 Encounter for screening mammogram for malignant neoplasm of breast: Secondary | ICD-10-CM

## 2020-03-23 MED ORDER — ROSUVASTATIN CALCIUM 40 MG PO TABS: 40.0000 mg | ORAL_TABLET | Freq: Every day | ORAL | 1 refills | Status: DC

## 2020-03-26 ENCOUNTER — Ambulatory Visit: Payer: Medicare HMO | Admitting: Podiatry

## 2020-03-28 ENCOUNTER — Other Ambulatory Visit: Payer: Self-pay | Admitting: Family Medicine

## 2020-03-28 ENCOUNTER — Telehealth: Payer: Self-pay

## 2020-04-05 ENCOUNTER — Other Ambulatory Visit: Payer: Self-pay

## 2020-04-05 ENCOUNTER — Ambulatory Visit: Payer: Medicare HMO | Admitting: Podiatry

## 2020-04-05 ENCOUNTER — Encounter: Payer: Self-pay | Admitting: Podiatry

## 2020-04-05 DIAGNOSIS — L03031 Cellulitis of right toe: Secondary | ICD-10-CM | POA: Diagnosis not present

## 2020-04-05 NOTE — Progress Notes (Signed)
This patient returns to the office for at risk foot care.  Stated that she had an ingrowing toenail on the outside border of the big toe of the right foot weeks ago.  She was seen in the office and there was no evidence of any infection or pain noted at that time.  I told the patient that if this problem reoccurs she should make an appointment and we will consider surgical correction of the ingrowing toenail right big toe.  She says her toe has become painful and red walking and wearing her shoe on the outside border of the right big toe.  She requests nail surgery for the correction of this problem.  Patient is diabetic with no complications.  Vascular  Dorsalis pedis and posterior tibial pulses are palpable  B/L.  Capillary return  WNL.  Temperature gradient is  WNL.  Skin turgor  WNL  Sensorium  Senn Weinstein monofilament wire  WNL. Normal tactile sensation.  Nail Exam  Patient has normal nails with no evidence of bacterial or fungal infection.  Mild redness and swelling along the lateral border right hallux.  Orthopedic  Exam  Muscle tone and muscle strength  WNL.  No limitations of motion feet  B/L.  No crepitus or joint effusion noted.  Foot type is unremarkable and digits show no abnormalities. Mild  HAV  B/L.  ADV fifth toe  B/L  Skin  No open lesions.  Normal skin texture and turgor.  Paronychia lateral border right hallux.  ROV.  Nail surgery.  Treatment options and alternatives discussed.  Recommended permanent phenol matrixectomy and patient agreed. Right hallux  was prepped with alcohol and a toe block of 3cc of 2% lidocaine plain was administered in a digital toe block. .  The toe was then prepped with betadine solution . A tourniquet was applied to toe. The offending nail border was then excised and matrix tissue exposed.  Phenol was then applied to the matrix tissue followed by an alcohol wash.  Antibiotic ointment and a dry sterile dressing was applied.  The patient was dispensed  instructions for aftercare. RTC 7 days      Gardiner Barefoot DPM

## 2020-04-07 ENCOUNTER — Other Ambulatory Visit: Payer: Self-pay | Admitting: Internal Medicine

## 2020-04-12 ENCOUNTER — Other Ambulatory Visit: Payer: Self-pay

## 2020-04-12 ENCOUNTER — Ambulatory Visit (INDEPENDENT_AMBULATORY_CARE_PROVIDER_SITE_OTHER): Payer: Medicare HMO | Admitting: Podiatry

## 2020-04-12 ENCOUNTER — Encounter: Payer: Self-pay | Admitting: Podiatry

## 2020-04-12 DIAGNOSIS — Z09 Encounter for follow-up examination after completed treatment for conditions other than malignant neoplasm: Secondary | ICD-10-CM

## 2020-04-12 NOTE — Progress Notes (Signed)
This patient returns to the office following nail surgery one week ago.  The patient says toe has been soaked and bandaged as directed.  There has been improvement of the toe since the surgery has been performed. The patient presents for continued evaluation and treatment.  GENERAL APPEARANCE: Alert, conversant. Appropriately groomed. No acute distress.  VASCULAR: Pedal pulses palpable at  DP and PT bilateral.  Capillary refill time is immediate to all digits,  Normal temperature gradient.    NEUROLOGIC: sensation is normal to 5.07 monofilament at 5/5 sites bilateral.  Light touch is intact bilateral, Muscle strength normal.  MUSCULOSKELETAL: acceptable muscle strength, tone and stability bilateral.  Intrinsic muscluature intact bilateral.  Rectus appearance of foot and digits noted bilateral.   DERMATOLOGIC: skin color, texture, and turgor are within normal limits.  No preulcerative lesions or ulcers  are seen, no interdigital maceration noted.   NAILS  There is necrotic tissue along the nail groove  In the absence of redness swelling and pain.  DX  S/p nail surgery  ROV  Home instructions were discussed.  Patient to call the office if there are any questions or concerns.   Baldemar Dady DPM   

## 2020-04-22 IMAGING — DX DG KNEE COMPLETE 4+V*L*
5 series · 5 of 5 positions shown · non-contrast
Comparison: 10/12/2013

CLINICAL DATA: Left knee pain for 2 weeks

EXAM:
LEFT KNEE - COMPLETE 4+ VIEW

[knee standing ap]
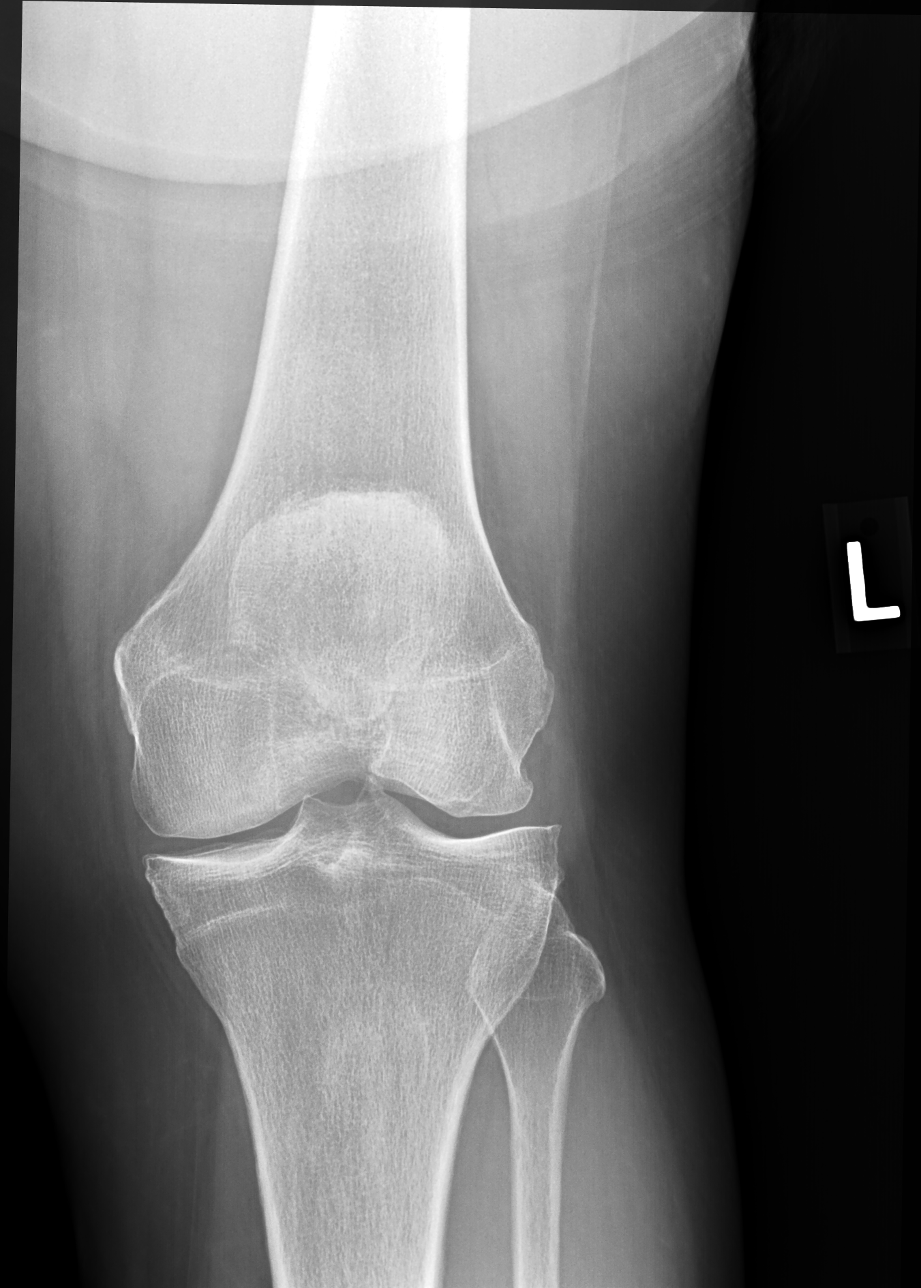

[knee standing external ap]
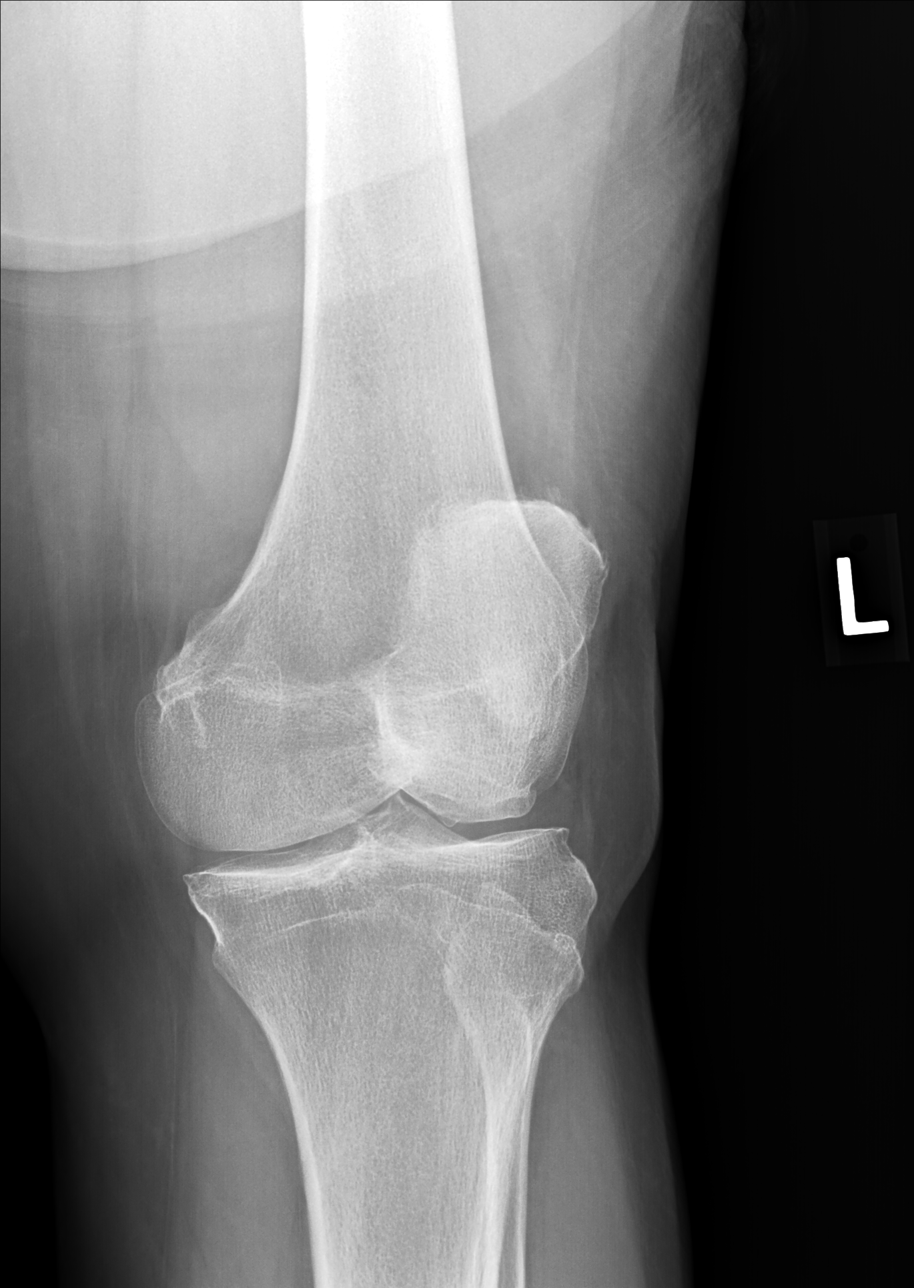

[knee standing internal ap]
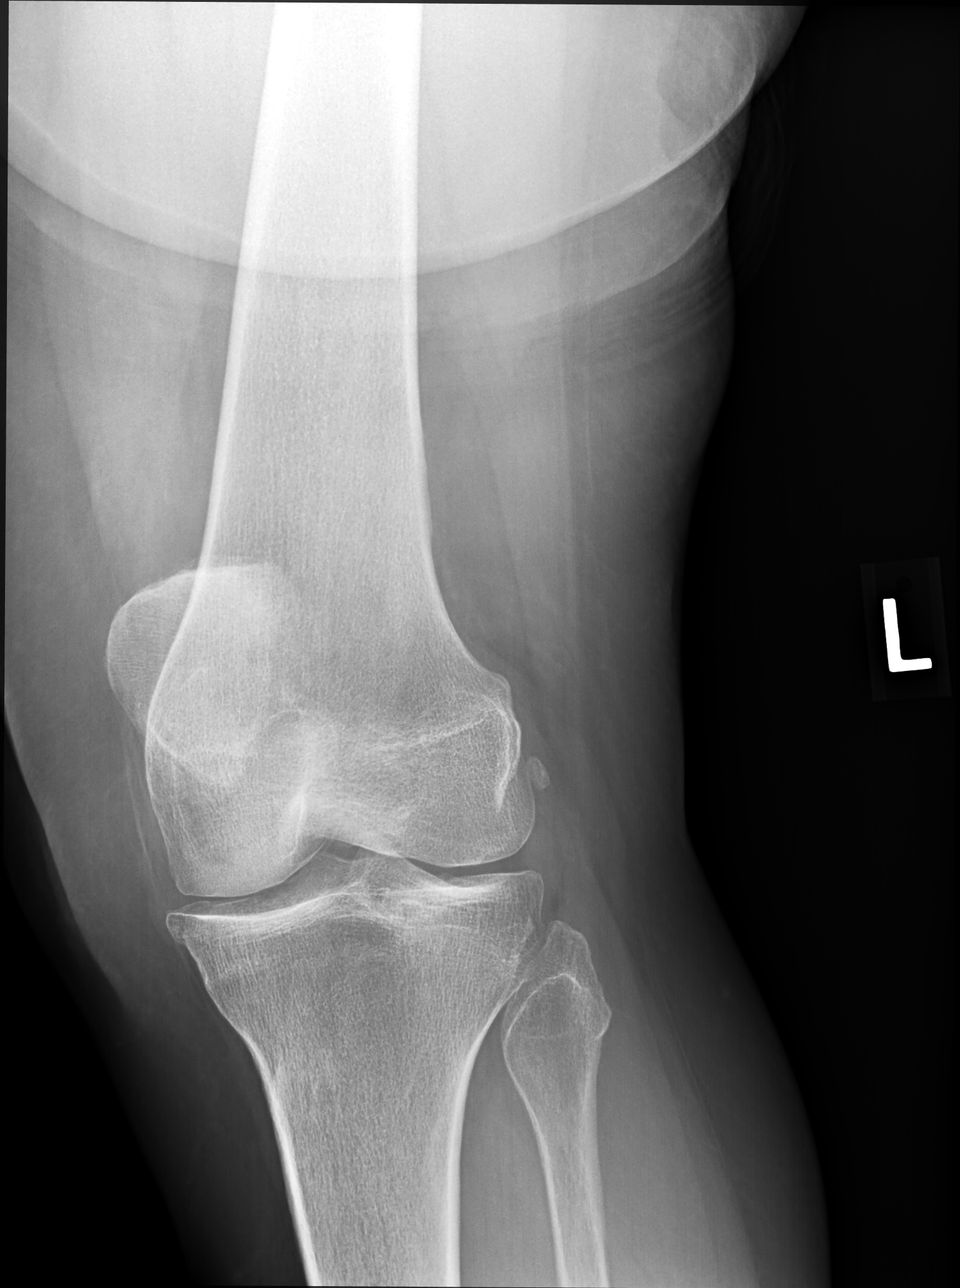

[knee standing lat]
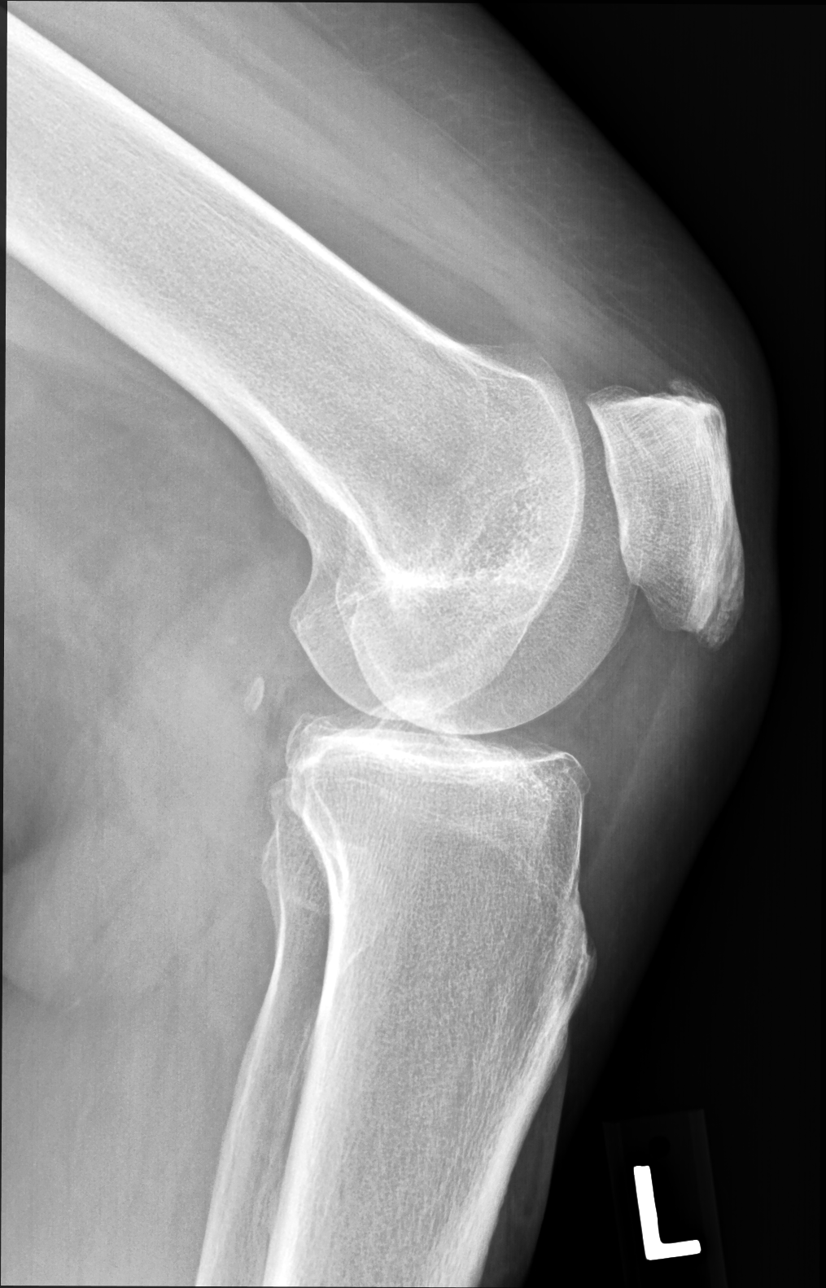

[knee [person_name] view pa]
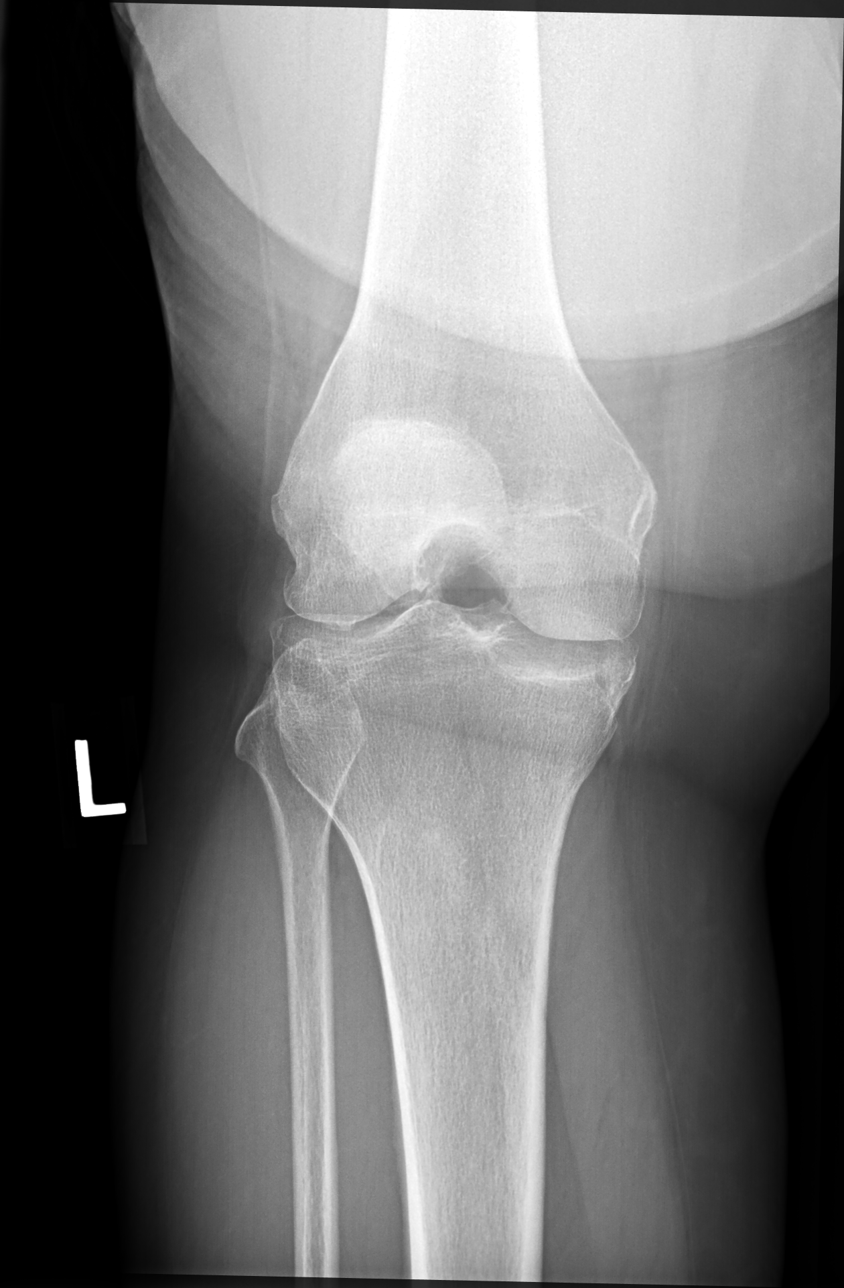

[5 of 5 positions shown; findings below may reference images not displayed]

FINDINGS: Early spurring within the left knee. No acute bony abnormality.
Specifically, no fracture, subluxation, or dislocation. No joint
effusion.
IMPRESSION: Early osteoarthritis.  No acute bony abnormality.

## 2020-04-23 ENCOUNTER — Telehealth (INDEPENDENT_AMBULATORY_CARE_PROVIDER_SITE_OTHER): Payer: Medicare HMO | Admitting: Gastroenterology

## 2020-04-23 DIAGNOSIS — K581 Irritable bowel syndrome with constipation: Secondary | ICD-10-CM

## 2020-04-23 NOTE — Progress Notes (Signed)
Valerie Vazquez , MD [address]  [place]  [place], [place] [address]  Main: [phone]  Fax: [phone]   Primary Care Physician: [name], MD  Virtual Visit via Telephone Note  I connected with patient on 04/23/20 at  2:15 PM EDT by telephone and verified that I am speaking with the correct person using two identifiers.   I discussed the limitations, risks, security and privacy concerns of performing an evaluation and management service by telephone and the availability of in person appointments. I also discussed with the patient that there may be a patient responsible charge related to this service. The patient expressed understanding and agreed to proceed.  Location of Patient: Home Location of Provider: Home Persons involved: Patient and provider only   History of Present Illness: Chief Complaint  Patient presents with  . Follow-up    HPI: Valerie Vazquez is a 64 y.o. 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 Constipation resolved with Rx and dysphagia resolved with PPI  EGD 02/17/17-normal  Colonoscopy 02/17/17- 6 mm serrated adenoma resected.CT abdomen in 09/2016 showed no abnormalities of the liver . 02/09/2019: CMP-normal LFT's , CBC-stable. 02/16/2019: H pylori stool antigen negative 02/18/2019: CT abdomen- small b/l effusions, thickeneing of the distal gastrioc antrum and 3rd portion of the duodenum. 03/07/2019: EGD: gastric bx - gastritis  Interval history 02/07/2020-04/23/2020 At her last visit felt she had IBS constipation.  Commenced on Linzess 145 mcg after last visit but subsequently she states that she stopped taking it.  States that her bowel movements are doing fine at this point of time..   Current Outpatient Medications  Medication Sig Dispense Refill  . acetaminophen (TYLENOL) 500 MG tablet Take 1 tablet (500 mg  total) by mouth every 6 (six) hours as needed. 30 tablet 0  . Alirocumab (PRALUENT) 75 MG/ML SOAJ Inject 75 mg into the skin every 14 (fourteen) days. 6 pen 1  . baclofen (LIORESAL) 10 MG tablet TAKE 1 TABLET BY MOUTH THREE TIMES A DAY AS NEEDED FOR MUSCLE SPASMS 60 tablet 1  . blood glucose meter kit and supplies KIT Dispense based on patient and insurance preference. Use once daily. Dx code E11.9. 1 each 0  . carvedilol (COREG) 3.125 MG tablet TAKE 1 TABLET (3.125 MG TOTAL) BY MOUTH 2 (TWO) TIMES DAILY WITH A MEAL. 180 tablet 3  . cetirizine (ZYRTEC) 10 MG tablet TAKE 1 TABLET BY MOUTH EVERY DAY 90 tablet 3  . diclofenac sodium (VOLTAREN) 1 % GEL Apply 2 g topically 4 (four) times daily. Right arm/shoulder arthritis 1 Tube 2  . dicyclomine (BENTYL) 20 MG tablet TAKE 1 TABLET BY MOUTH THREE TIMES A DAY AS NEEDED FOR MUSCLE SPASMS 270 tablet 0  . diphenhydrAMINE (BENADRYL) 25 MG tablet Take 1 tablet (25 mg total) by mouth every 6 (six) hours. (Patient taking differently: Take 25 mg by mouth every 8 (eight) hours as needed. ) 12 tablet 0  . ELIQUIS 5 MG TABS tablet TAKE 1 TABLET BY MOUTH TWICE A DAY 60 tablet 5  . empagliflozin (JARDIANCE) 25 MG TABS tablet Take 1 tablet (25 mg total) by mouth daily. 90 tablet 1  . escitalopram (LEXAPRO) 20 MG tablet TAKE 1 TABLET BY MOUTH EVERY DAY 90 tablet 1  . famotidine (PEPCID) 20 MG tablet TAKE 1 TABLET BY MOUTH EVERYDAY AT BEDTIME 90 tablet 0  . furosemide (LASIX) 20 MG  tablet TAKE 1 TABLET (20 MG TOTAL) BY MOUTH DAILY AS NEEDED FOR EDEMA. 90 tablet 1  . gabapentin (NEURONTIN) 600 MG tablet TAKE 1 TABLET IN THE AM AND 1.5 TABLETS AT BEDTIME 225 tablet 1  . glucose blood test strip Check once daily, dispense per patient and insurance preference, dx code E11.9 100 each 12  . isosorbide mononitrate (IMDUR) 30 MG 24 hr tablet TAKE 1.5 TABLETS (45 MG TOTAL) BY MOUTH DAILY. (Patient taking differently: 30 mg daily. ) 135 tablet 3  . JANUVIA 100 MG tablet TAKE 1  TABLET BY MOUTH EVERY DAY 90 tablet 1  . KLOR-CON M20 20 MEQ tablet TAKE 1 TABLET (20 MEQ TOTAL) BY MOUTH DAILY AS NEEDED (TAKE WITH LASIX). 90 tablet 1  . levothyroxine (SYNTHROID) 25 MCG tablet TAKE 1 TABLET BY MOUTH BEFORE BREAKFAST 2-3 HOURS BEFORE TAKING ANY OTHER MEDICATION OR SUPPLEMENT 90 tablet 3  . lidocaine (LIDODERM) 5 % Place 1 patch onto the skin daily. Remove & Discard patch within 12 hours. 30 patch 0  . Multiple Vitamins-Minerals (SUPER THERA VITE M) TABS Take by mouth.    . nitroGLYCERIN (NITROSTAT) 0.4 MG SL tablet Place 1 tablet (0.4 mg total) under the tongue every 5 (five) minutes as needed for chest pain. 25 tablet 4  . OLANZapine (ZYPREXA) 10 MG tablet TAKE 1 TABLET BY MOUTH EVERYDAY AT BEDTIME 90 tablet 0  . ondansetron (ZOFRAN) 4 MG tablet TAKE 1 TABLET (4 MG TOTAL) BY MOUTH 2 (TWO) TIMES DAILY AS NEEDED FOR NAUSEA OR VOMITING. 30 tablet 0  . pantoprazole (PROTONIX) 40 MG tablet TAKE 1 TABLET BY MOUTH EVERY DAY 90 tablet 1  . rosuvastatin (CRESTOR) 40 MG tablet Take 1 tablet (40 mg total) by mouth daily. 90 tablet 1   No current facility-administered medications for this visit.    Allergies as of 04/23/2020 - Review Complete 04/23/2020  Allergen Reaction Noted  . Abilify [aripiprazole] Other (See Comments) 09/20/2013  . Augmentin [amoxicillin-pot clavulanate] Diarrhea and Nausea And Vomiting 09/20/2013  . Bactrim [sulfamethoxazole-trimethoprim] Diarrhea 09/20/2013  . Ceftin [cefuroxime axetil] Diarrhea 09/20/2013  . Coconut oil  10/19/2015  . Diclofenac Other (See Comments) 08/01/2014  . Dye fdc red [red dye]  01/22/2016  . Indocin [indomethacin] Other (See Comments) 09/20/2013  . Linaclotide Diarrhea 12/09/2014  . Lisinopril Diarrhea and Other (See Comments) 09/20/2013  . Morphine Other (See Comments) 12/09/2014  . Morphine and related Other (See Comments) 09/20/2013  . Simvastatin Other (See Comments) 08/01/2014  . Sulfa antibiotics Diarrhea 12/09/2014  .  Sulfamethoxazole-trimethoprim Diarrhea 12/09/2014  . Wellbutrin [bupropion] Other (See Comments) 09/20/2013  . Zetia [ezetimibe]  09/18/2017  . Quetiapine fumarate Palpitations 08/01/2014  . Quetiapine fumarate Palpitations 08/16/2014    Review of Systems:    All systems reviewed and negative except where noted in HPI.   Observations/Objective:  Labs: CMP     Component Value Date/Time   NA 140 01/27/2020 1138   NA 136 05/28/2014 0034   K 4.0 01/27/2020 1138   K 3.6 05/28/2014 0542   CL 104 01/27/2020 1138   CL 106 05/28/2014 0034   CO2 27 01/27/2020 1138   CO2 28 05/28/2014 0034   GLUCOSE 159 (H) 01/27/2020 1138   GLUCOSE 112 (H) 05/28/2014 0034   BUN 13 01/27/2020 1138   BUN 10 05/28/2014 0034   CREATININE 0.84 01/27/2020 1138   CREATININE 1.03 (H) 03/19/2018 1627   CALCIUM 9.0 01/27/2020 1138   CALCIUM 9.2 05/28/2014 0034   PROT 6.9 01/27/2020  1138   PROT 7.1 03/30/2018 0000   PROT 8.0 05/28/2014 0034   ALBUMIN 3.9 01/27/2020 1138   ALBUMIN 4.0 03/30/2018 0000   ALBUMIN 3.6 05/28/2014 0034   AST 16 01/27/2020 1138   AST 44 (H) 05/28/2014 0034   ALT 16 01/27/2020 1138   ALT 33 05/28/2014 0034   ALKPHOS 116 01/27/2020 1138   ALKPHOS 169 (H) 05/28/2014 0034   BILITOT 0.5 01/27/2020 1138   BILITOT 0.3 03/30/2018 0000   BILITOT 0.3 05/28/2014 0034   GFRNONAA >60 05/19/2019 0010   GFRNONAA >60 05/28/2014 0034   GFRNONAA >60 04/07/2014 0700   GFRAA >60 05/19/2019 0010   GFRAA >60 05/28/2014 0034   GFRAA >60 04/07/2014 0700   Lab Results  Component Value Date   WBC 7.6 01/27/2020   HGB 13.0 01/27/2020   HCT 39.2 01/27/2020   MCV 94.0 01/27/2020   PLT 324.0 01/27/2020    Imaging Studies: No results found.  Assessment and Plan:   Valerie Vazquez is a 64 y.o. y/o female here to see me as a follow-up for IBS-C.    Started on Linzess at her last visit she subsequently stopped taking it.  Doing well.  No complaints.  I informed her that if the constipation  were to recur to give me a call and restart Linzess.   Follow-up as needed   I discussed the assessment and treatment plan with the patient. The patient was provided an opportunity to ask questions and all were answered. The patient agreed with the plan and demonstrated an understanding of the instructions.   The patient was advised to call back or seek an in-person evaluation if the symptoms worsen or if the condition fails to improve as anticipated.  I provided 11 minutes of non-face-to-face time during this encounter.  Dr Valerie Bellows MD,MRCP [facility]) Gastroenterology/Hepatology Pager: [phone]   Speech recognition software was used to dictate this note.

## 2020-04-27 ENCOUNTER — Other Ambulatory Visit: Payer: Self-pay | Admitting: Family Medicine

## 2020-04-27 DIAGNOSIS — R110 Nausea: Secondary | ICD-10-CM

## 2020-04-29 ENCOUNTER — Other Ambulatory Visit: Payer: Self-pay | Admitting: Family Medicine

## 2020-04-29 DIAGNOSIS — Z760 Encounter for issue of repeat prescription: Secondary | ICD-10-CM

## 2020-04-30 ENCOUNTER — Other Ambulatory Visit: Payer: Self-pay

## 2020-04-30 ENCOUNTER — Ambulatory Visit
Admission: RE | Admit: 2020-04-30 | Discharge: 2020-04-30 | Disposition: A | Discharge status: Home / Self Care | Payer: Medicare HMO | Source: Ambulatory Visit | Attending: Family Medicine | Admitting: Family Medicine

## 2020-04-30 DIAGNOSIS — Z1231 Encounter for screening mammogram for malignant neoplasm of breast: Secondary | ICD-10-CM | POA: Insufficient documentation

## 2020-05-02 ENCOUNTER — Encounter: Payer: Self-pay | Admitting: Family Medicine

## 2020-05-02 DIAGNOSIS — R1013 Epigastric pain: Secondary | ICD-10-CM

## 2020-05-03 ENCOUNTER — Other Ambulatory Visit: Payer: Self-pay

## 2020-05-03 ENCOUNTER — Other Ambulatory Visit (INDEPENDENT_AMBULATORY_CARE_PROVIDER_SITE_OTHER): Payer: Medicare HMO

## 2020-05-03 DIAGNOSIS — E785 Hyperlipidemia, unspecified: Secondary | ICD-10-CM | POA: Diagnosis not present

## 2020-05-03 LAB — HEPATIC FUNCTION PANEL
ALT: 21 U/L (ref 0–35)
AST: 18 U/L (ref 0–37)
Albumin: 4 g/dL (ref 3.5–5.2)
Alkaline Phosphatase: 119 U/L — ABNORMAL HIGH (ref 39–117)
Bilirubin, Direct: 0.1 mg/dL (ref 0.0–0.3)
Total Bilirubin: 0.5 mg/dL (ref 0.2–1.2)
Total Protein: 7.1 g/dL (ref 6.0–8.3)

## 2020-05-03 LAB — LDL CHOLESTEROL, DIRECT: Direct LDL: 111 mg/dL

## 2020-05-03 MED ORDER — FAMOTIDINE 20 MG PO TABS: ORAL_TABLET | ORAL | 1 refills | Status: DC

## 2020-05-24 ENCOUNTER — Other Ambulatory Visit: Payer: Self-pay | Admitting: Family Medicine

## 2020-05-24 DIAGNOSIS — F411 Generalized anxiety disorder: Secondary | ICD-10-CM

## 2020-05-24 DIAGNOSIS — R110 Nausea: Secondary | ICD-10-CM

## 2020-05-24 DIAGNOSIS — F250 Schizoaffective disorder, bipolar type: Secondary | ICD-10-CM

## 2020-05-24 IMAGING — CT CT ANGIO CHEST
2 of 6 series · 18 of 46 positions shown · IV contrast (APPLIED)
Comparison: 02/23/2017.

CLINICAL DATA: Pain back of R leg began today, shooting pain.
Clinical concern for pulmonary embolus.

EXAM:
CT ANGIOGRAPHY CHEST WITH CONTRAST
TECHNIQUE: Multidetector CT imaging of the chest was performed using the
standard protocol during bolus administration of intravenous
contrast. Multiplanar CT image reconstructions and MIPs were
obtained to evaluate the vascular anatomy.
CONTRAST:  75mL OMNIPAQUE IOHEXOL 350 MG/ML SOLN

[Series 5: thins · axial · 0.71mm/px · z∈[-573,-325]mm · 15 of 272 slices shown]
[im 12/272  lung]
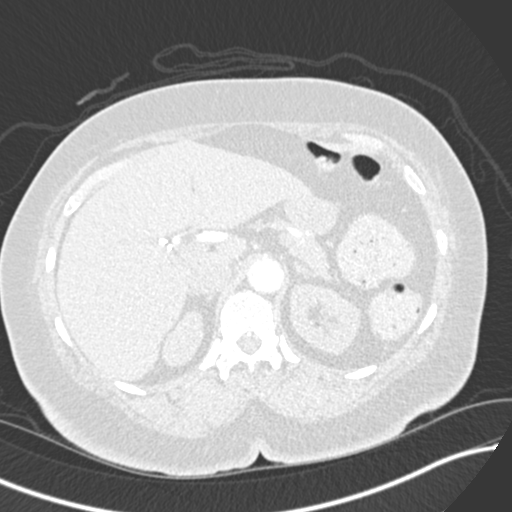
[im 36/272  soft-tissue]
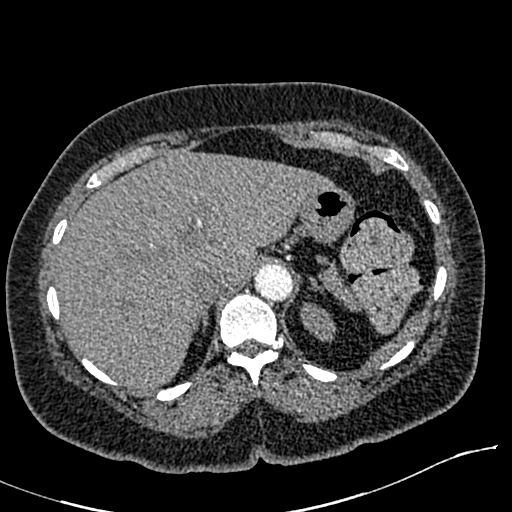
[im 48/272  lung]
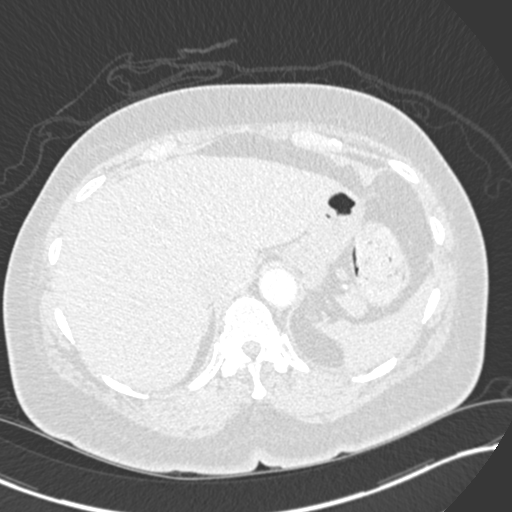
[im 71/272  soft-tissue]
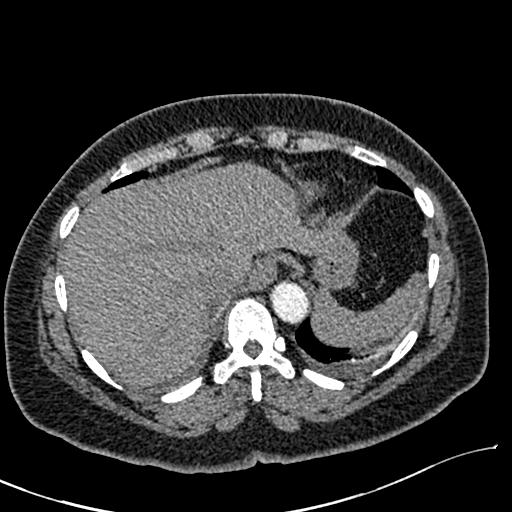
[im 83/272  lung]
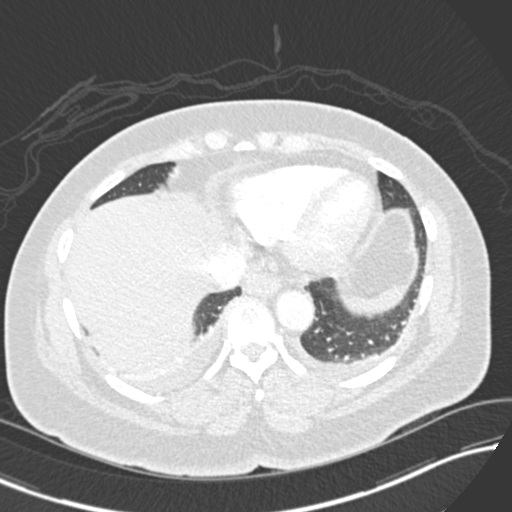
[im 107/272  soft-tissue]
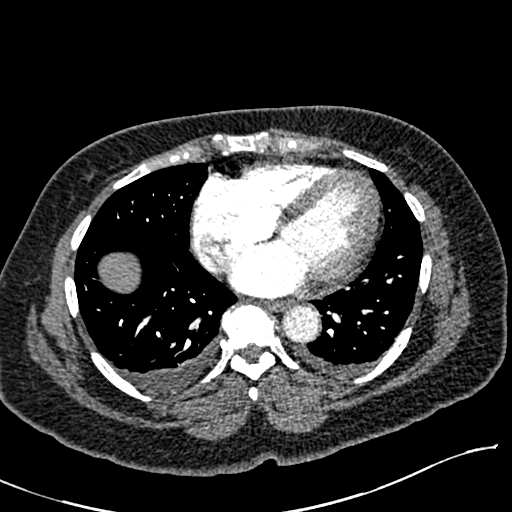
[im 118/272  lung]
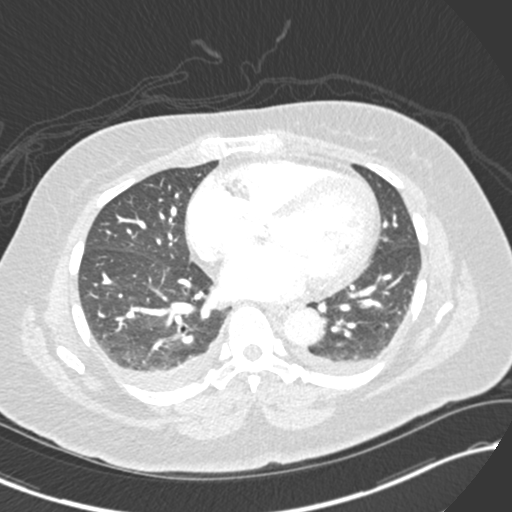
[im 142/272  soft-tissue]
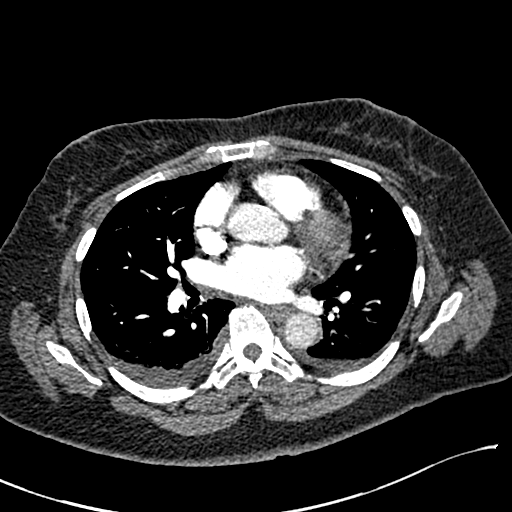
[im 154/272  lung]
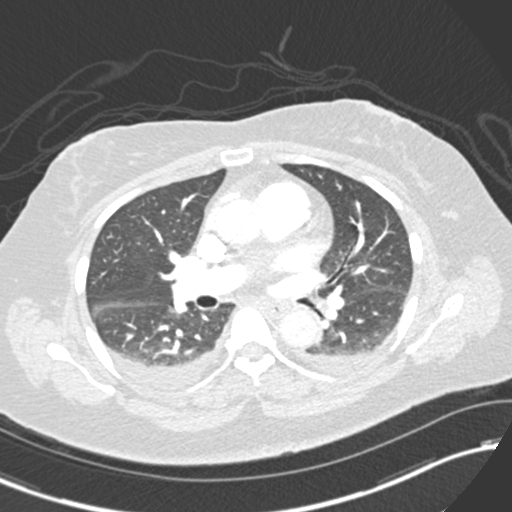
[im 165/272  soft-tissue]
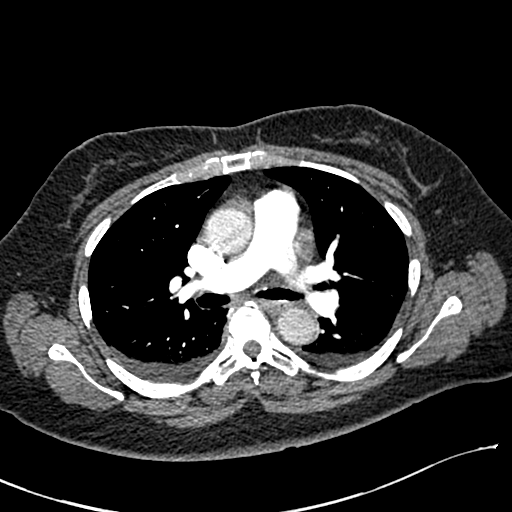
[im 189/272  lung]
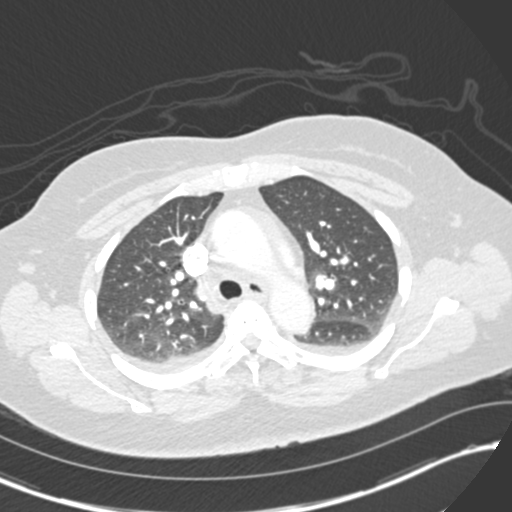
[im 201/272  soft-tissue]
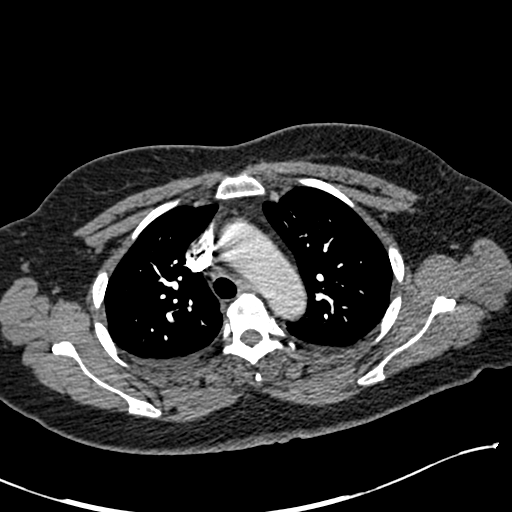
[im 224/272  lung]
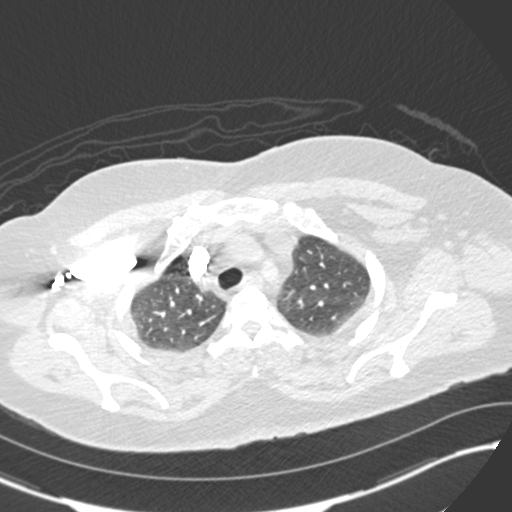
[im 236/272  soft-tissue]
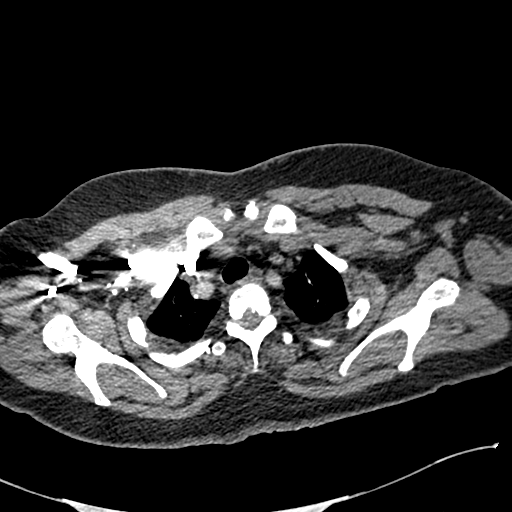
[im 260/272  lung]
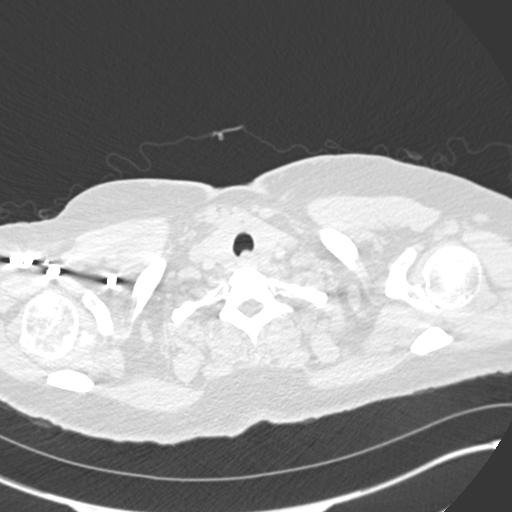

[Series 7: coronal mpr · coronal · 0.53mm/px · 3 of 87 slices shown]
[im 22/87  soft-tissue]
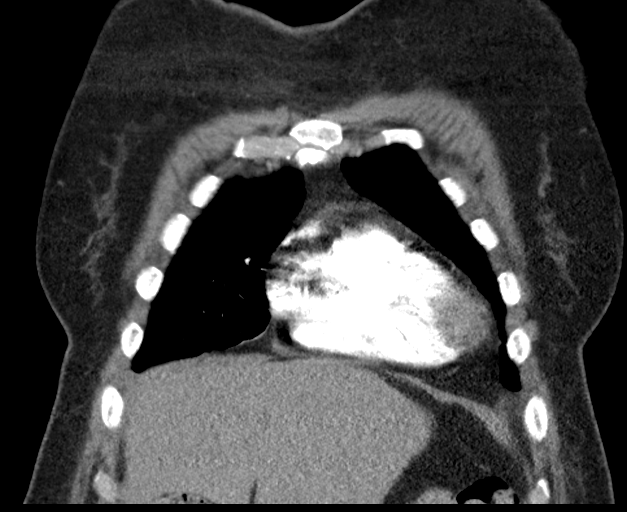
[im 44/87  soft-tissue]
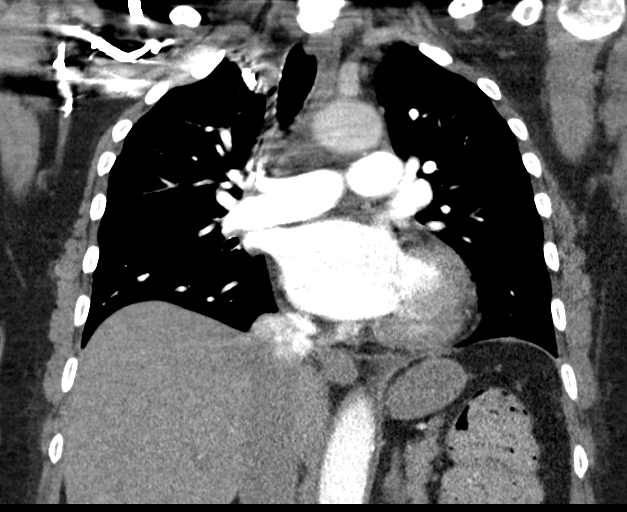
[im 65/87  soft-tissue]
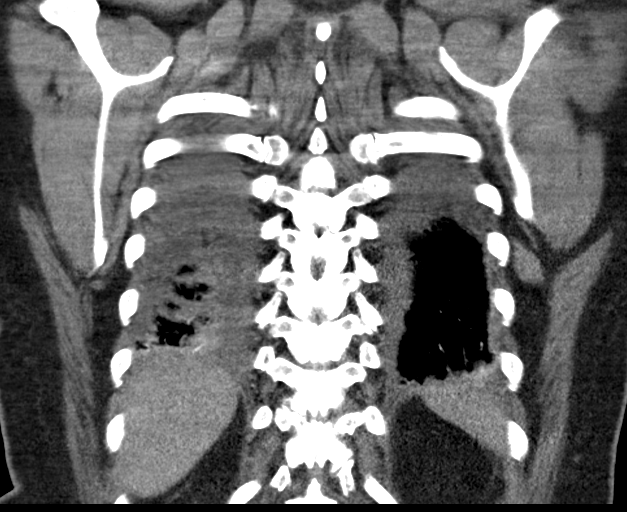

[18 of 46 positions shown; findings below may reference images not displayed]

FINDINGS: Cardiovascular: Heart size upper normal. No substantial pericardial
effusion. Coronary artery calcification is evident. No filling
defect in the opacified pulmonary arteries to suggest the presence
of an acute pulmonary embolus.

Mediastinum/Nodes: No mediastinal lymphadenopathy. There is no hilar
lymphadenopathy. The esophagus has normal imaging features. There is
no axillary lymphadenopathy.

Lungs/Pleura: The central tracheobronchial airways are patent.
Bilateral lower lobe atelectasis is associated with small bilateral
pleural effusions. No focal airspace consolidation. No pulmonary
edema.

Upper Abdomen: Unremarkable.

Musculoskeletal: No worrisome lytic or sclerotic osseous
abnormality.

Review of the MIP images confirms the above findings.
IMPRESSION: 1. No CT evidence for acute pulmonary embolus.
2. Small bilateral pleural effusions with dependent atelectasis.

## 2020-05-24 IMAGING — CR DG CHEST 2V
1 series · 2 of 2 positions shown · non-contrast
Comparison: 08/21/2017.

CLINICAL DATA: Intermittent chest pain. Ex-smoker.

EXAM:
CHEST - 2 VIEW

[Series 1: dg chest 2 view · 0.14mm/px · 2 of 2 slices shown]
[im 1/2]
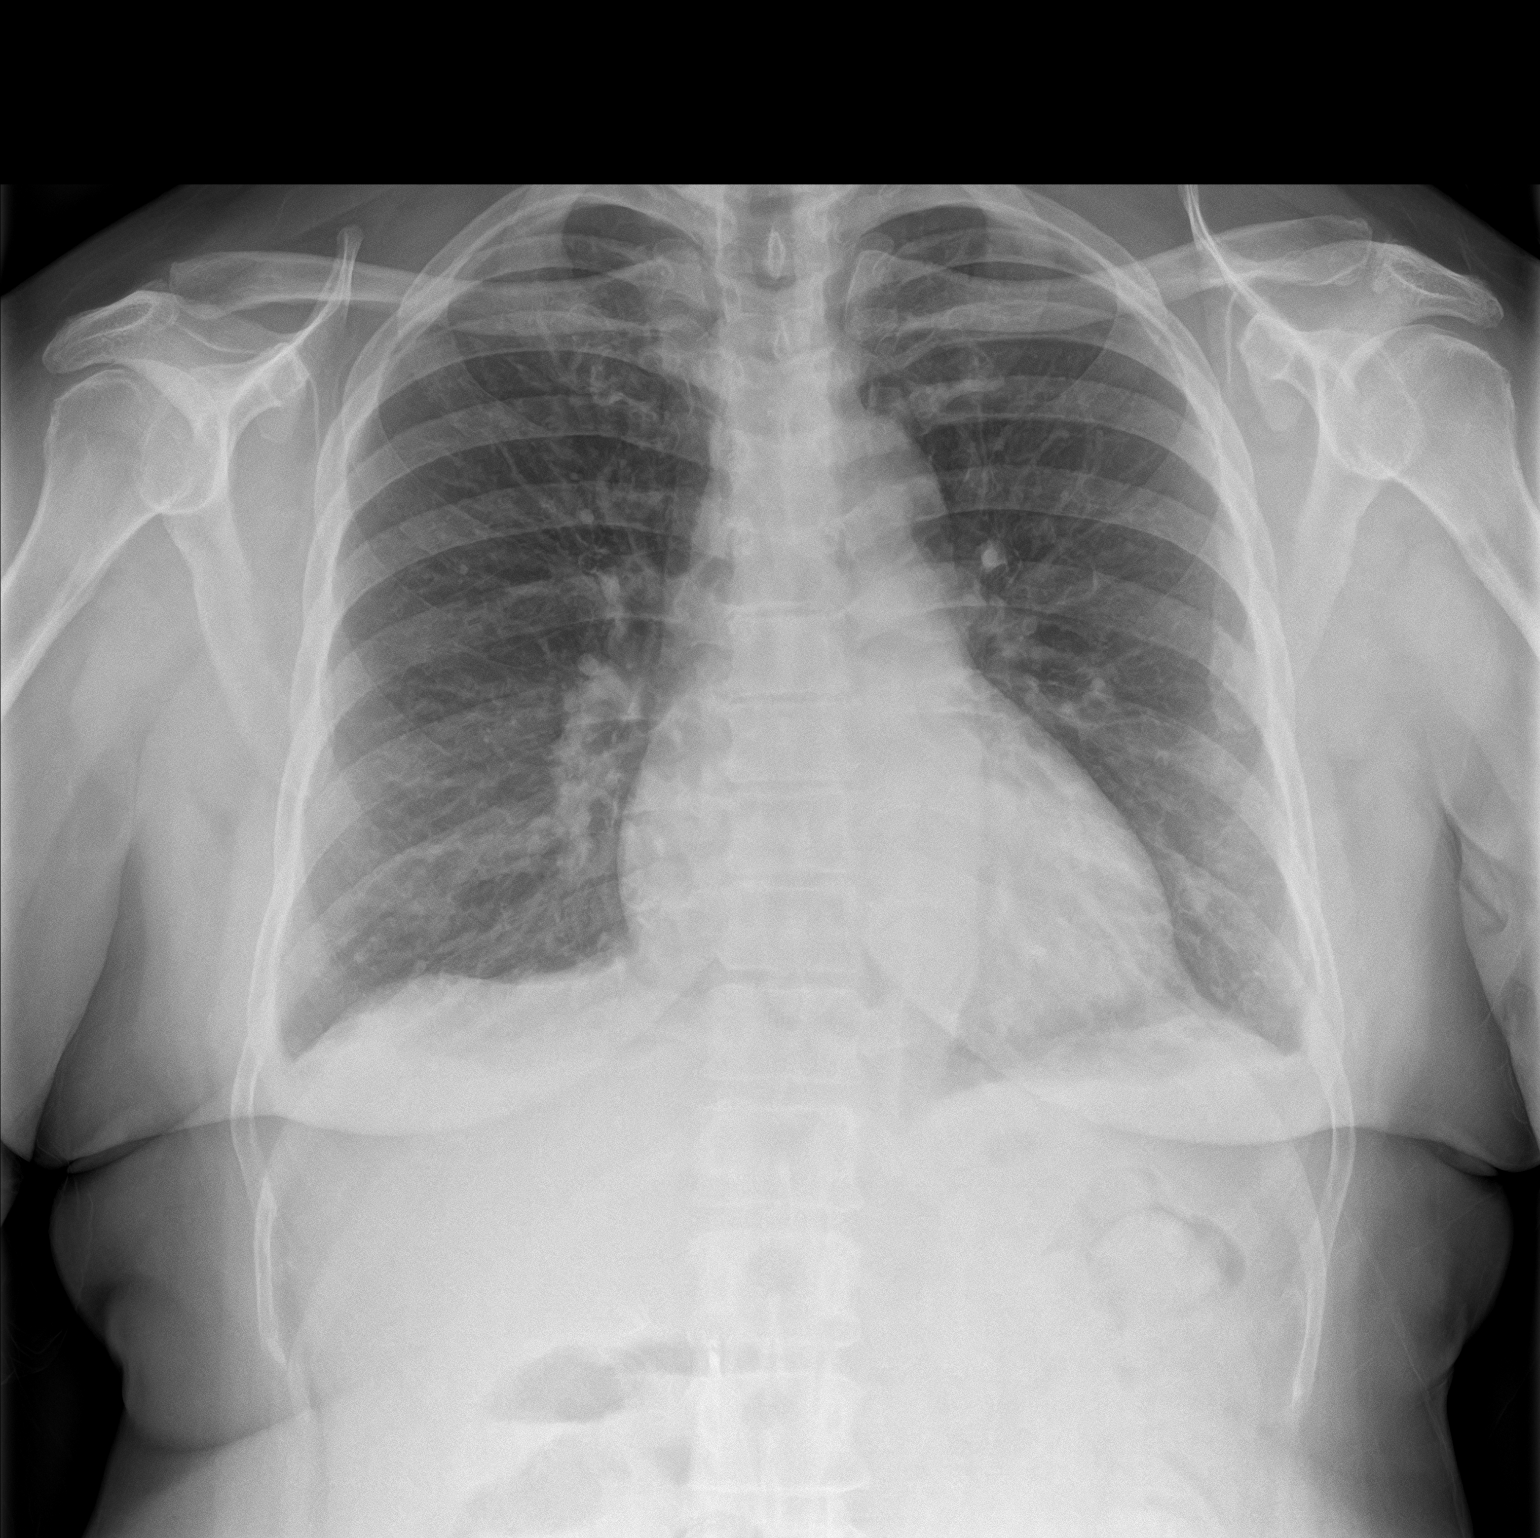
[im 2/2]
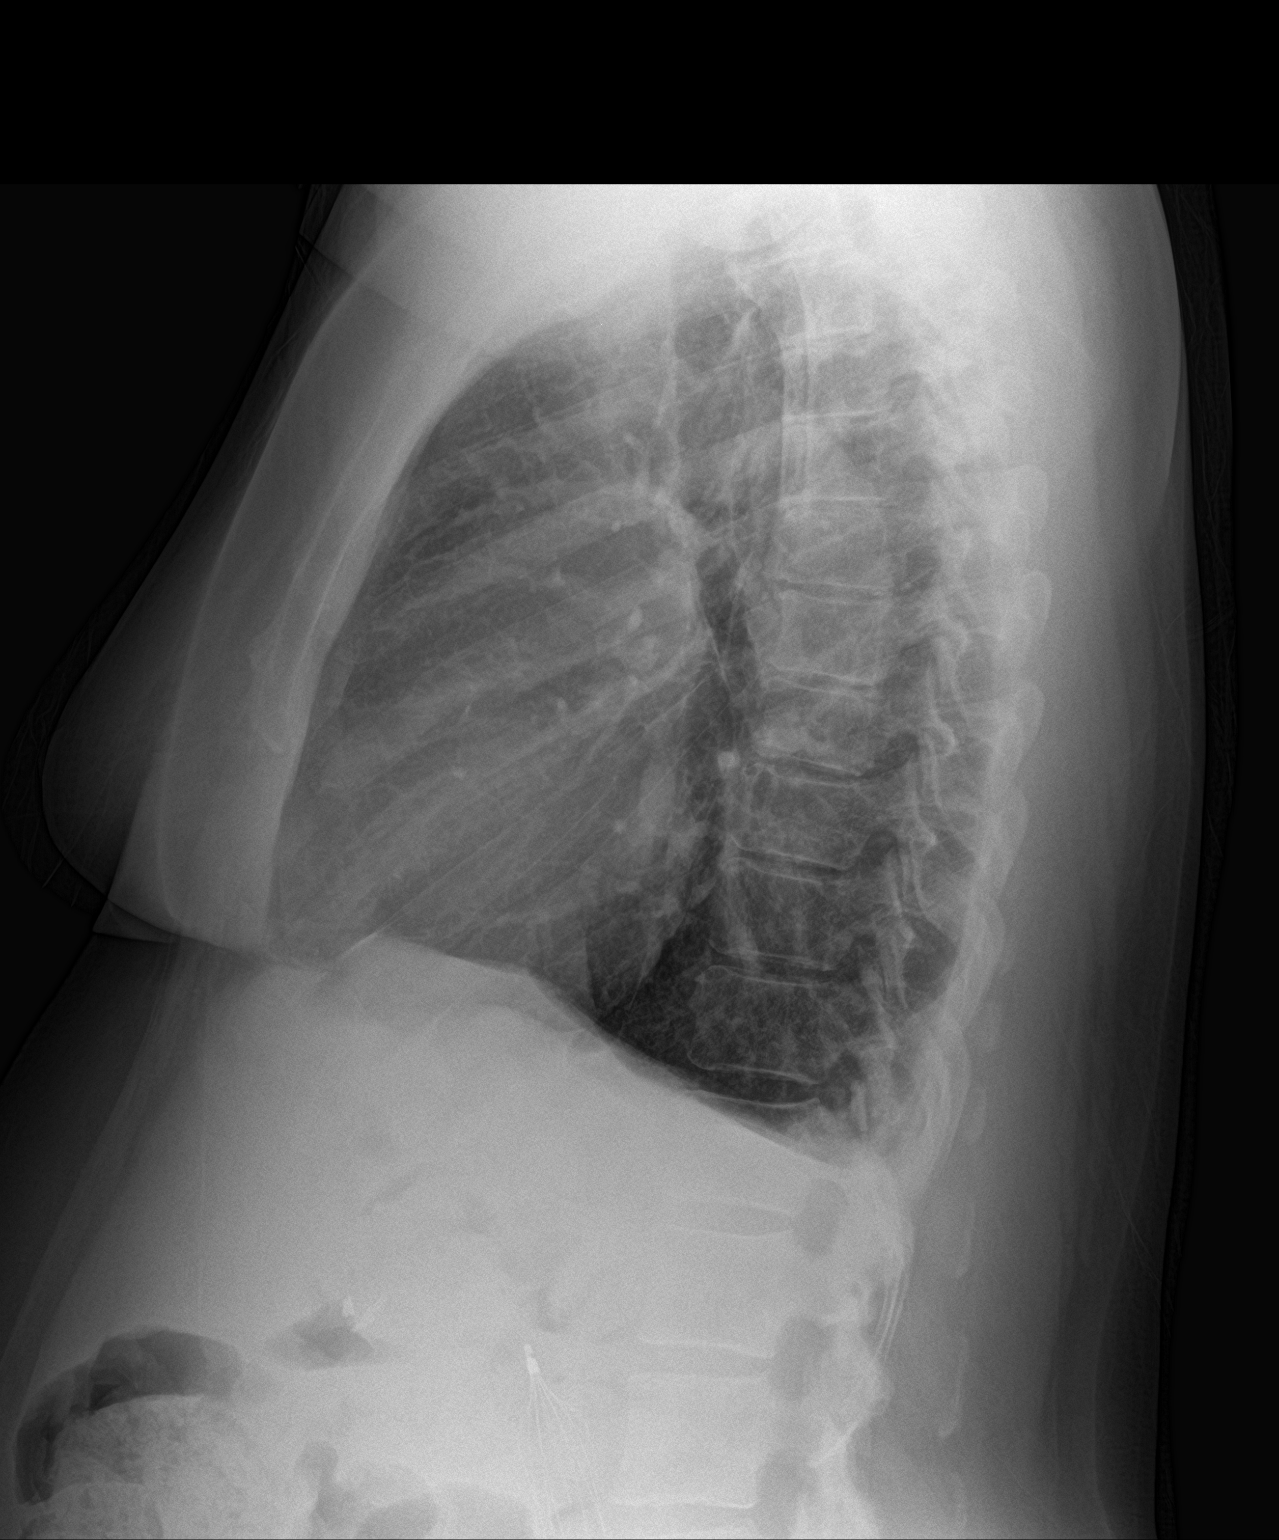

[2 of 2 positions shown; findings below may reference images not displayed]

FINDINGS: Normal sized heart. Mildly tortuous aorta. No significant change in
minimal bilateral pleural thickening or fluid. Minimal atelectasis
or scarring at the posterior lung bases on the lateral view. The
overall lung volumes remain mildly hyperexpanded. Cervical spine
fixation hardware, cholecystectomy clips and inferior vena cava
filter.
IMPRESSION: 1. No acute abnormality.
2. Stable mild changes of COPD.
3. Stable minimal bilateral pleural thickening or fluid.

## 2020-05-26 ENCOUNTER — Other Ambulatory Visit: Payer: Self-pay | Admitting: Family Medicine

## 2020-05-26 MED ORDER — REPATHA SURECLICK 140 MG/ML ~~LOC~~ SOAJ: 140.0000 mg | SUBCUTANEOUS | 2 refills | Status: DC

## 2020-05-29 ENCOUNTER — Other Ambulatory Visit: Payer: Self-pay | Admitting: Family Medicine

## 2020-05-30 NOTE — Telephone Encounter (Signed)
I thought we did a PA on this and they said repatha was covered. Can you do a PA for this?

## 2020-06-01 ENCOUNTER — Emergency Department: Payer: Medicare HMO

## 2020-06-01 ENCOUNTER — Encounter: Payer: Self-pay | Admitting: Intensive Care

## 2020-06-01 ENCOUNTER — Other Ambulatory Visit: Payer: Self-pay

## 2020-06-01 ENCOUNTER — Emergency Department
Admission: EM | Admit: 2020-06-01 | Discharge: 2020-06-01 | Disposition: A | Discharge status: Home / Self Care | Payer: Medicare HMO | Attending: Emergency Medicine | Admitting: Emergency Medicine

## 2020-06-01 DIAGNOSIS — R0789 Other chest pain: Secondary | ICD-10-CM | POA: Insufficient documentation

## 2020-06-01 DIAGNOSIS — Z5321 Procedure and treatment not carried out due to patient leaving prior to being seen by health care provider: Secondary | ICD-10-CM | POA: Insufficient documentation

## 2020-06-01 DIAGNOSIS — J90 Pleural effusion, not elsewhere classified: Secondary | ICD-10-CM | POA: Diagnosis not present

## 2020-06-01 LAB — BASIC METABOLIC PANEL
Anion gap: 12 (ref 5–15)
BUN: 7 mg/dL — ABNORMAL LOW (ref 8–23)
CO2: 23 mmol/L (ref 22–32)
Calcium: 9.1 mg/dL (ref 8.9–10.3)
Chloride: 106 mmol/L (ref 98–111)
Creatinine, Ser: 0.93 mg/dL (ref 0.44–1.00)
GFR calc Af Amer: >60 mL/min (ref >60)
GFR calc non Af Amer: >60 mL/min (ref >60)
Glucose, Bld: 182 mg/dL — ABNORMAL HIGH (ref 70–99)
Potassium: 3.8 mmol/L (ref 3.5–5.1)
Sodium: 141 mmol/L (ref 135–145)

## 2020-06-01 LAB — CBC
HCT: 37.7 % (ref 36.0–46.0)
Hemoglobin: 13.3 g/dL (ref 12.0–15.0)
MCH: 32 pg (ref 26.0–34.0)
MCHC: 35.3 g/dL (ref 30.0–36.0)
MCV: 90.8 fL (ref 80.0–100.0)
Platelets: 336 10*3/uL (ref 150–400)
RBC: 4.15 MIL/uL (ref 3.87–5.11)
RDW: 13.8 % (ref 11.5–15.5)
WBC: 8.3 10*3/uL (ref 4.0–10.5)
nRBC: 0 % (ref 0.0–0.2)

## 2020-06-01 LAB — PROTIME-INR
INR: 1.2 (ref 0.8–1.2)
Prothrombin Time: 14.6 seconds (ref 11.4–15.2)

## 2020-06-01 LAB — TROPONIN I (HIGH SENSITIVITY)
Troponin I (High Sensitivity): 5 ng/L (ref <18)
Troponin I (High Sensitivity): 4 ng/L (ref <18)

## 2020-06-01 NOTE — ED Triage Notes (Signed)
Patient c/o heavy central and left sided chest discomfort. Reports accidentally taking an extra dose of her carvedilol on Wednesday and since then chest pains started, fluttering, pressure and skipping beats.

## 2020-06-01 NOTE — Telephone Encounter (Signed)
Abs completed.  Thuan Tippett,cma

## 2020-06-01 NOTE — ED Notes (Signed)
Pt informed front desk she is leaving

## 2020-06-01 NOTE — Telephone Encounter (Signed)
Patient has message from provider.  Valerie Vazquez,cma

## 2020-06-04 ENCOUNTER — Telehealth: Payer: Self-pay | Admitting: Family Medicine

## 2020-06-04 ENCOUNTER — Ambulatory Visit (INDEPENDENT_AMBULATORY_CARE_PROVIDER_SITE_OTHER): Payer: Medicare HMO | Admitting: Nurse Practitioner

## 2020-06-04 ENCOUNTER — Other Ambulatory Visit: Payer: Self-pay

## 2020-06-04 ENCOUNTER — Ambulatory Visit
Admission: RE | Admit: 2020-06-04 | Discharge: 2020-06-04 | Disposition: A | Discharge status: Home / Self Care | Payer: Medicare HMO | Source: Ambulatory Visit | Attending: Nurse Practitioner | Admitting: Nurse Practitioner

## 2020-06-04 ENCOUNTER — Encounter: Payer: Self-pay | Admitting: Nurse Practitioner

## 2020-06-04 VITALS — BP 128/72 | HR 82 | Temp 98.7°F | Ht 66.0 in | Wt 227.0 lb

## 2020-06-04 DIAGNOSIS — I5032 Chronic diastolic (congestive) heart failure: Secondary | ICD-10-CM | POA: Diagnosis not present

## 2020-06-04 DIAGNOSIS — I2510 Atherosclerotic heart disease of native coronary artery without angina pectoris: Secondary | ICD-10-CM | POA: Diagnosis not present

## 2020-06-04 DIAGNOSIS — Z86711 Personal history of pulmonary embolism: Secondary | ICD-10-CM

## 2020-06-04 DIAGNOSIS — Z86718 Personal history of other venous thrombosis and embolism: Secondary | ICD-10-CM | POA: Diagnosis not present

## 2020-06-04 DIAGNOSIS — R0789 Other chest pain: Secondary | ICD-10-CM | POA: Diagnosis not present

## 2020-06-04 DIAGNOSIS — R9431 Abnormal electrocardiogram [ECG] [EKG]: Secondary | ICD-10-CM | POA: Diagnosis not present

## 2020-06-04 DIAGNOSIS — I10 Essential (primary) hypertension: Secondary | ICD-10-CM | POA: Diagnosis not present

## 2020-06-04 DIAGNOSIS — R7989 Other specified abnormal findings of blood chemistry: Secondary | ICD-10-CM

## 2020-06-04 DIAGNOSIS — I201 Angina pectoris with documented spasm: Secondary | ICD-10-CM

## 2020-06-04 LAB — D-DIMER, QUANTITATIVE: D-Dimer, Quant: 0.53 mcg/mL FEU — ABNORMAL HIGH (ref <0.50)

## 2020-06-04 MED ORDER — IOHEXOL 350 MG/ML SOLN
100.0000 mL | Freq: Once | INTRAVENOUS | Status: AC | PRN
  Administered 2020-06-04: 100 mL via INTRAVENOUS

## 2020-06-04 NOTE — Patient Instructions (Addendum)
Please go to the lab.   We will call you with the result and make further recommendations.   OK to take Tylenol Arthritis as directed.   Treat neck arthritis as usual.   I placed Urgent referral in to your Cardiologist to follow up on the ER EKG and your current symptoms.

## 2020-06-04 NOTE — Telephone Encounter (Signed)
I called and informed the patient of her lab results and that her D-Dimer was positive and a CT angio was ordered stat and she will receive a call about where and when and she understood.  Nayson Traweek,cma

## 2020-06-04 NOTE — Progress Notes (Signed)
Established Patient Office Visit  Subjective:  Patient ID: Valerie Vazquez, female    DOB: 11-06-1955  Age: 64 y.o. MRN: [id number]  CC:  Chief Complaint  Patient presents with  . Acute Visit    right breast/chest pain    HPI Valerie Vazquez is a 64 yo who presents for right anterior chest pain that started on Wednesday 2 hours after she accidentally took an extra dose of Coreg. She felt middle of the chest pressure with radiation to the right and  left anterior chest, palpitations, fluttering, and shortness of breath.  She reported this was a heavy,  pressure sensation.  It waxed and waned Wednesday and  Thursday. On Friday,  it was more severe so  she went to the emergency room.  She waited a long time in the waiting room, and had an EKG done that showed ST wave changes concerning for inferior ischemia.  Negative troponin x 2. PT 14.6, INR 1.2. NL CBC, BMet w gluc 182. She had a chest x-ray that showed a small right pleural effusion, mild diffuse chronic appearing increased interstitial lung markings seen.  No evidence of acute infiltrate.  Heart size and mediastinal contours within normal.  She left without being seen by a provider.  Today,  she reports over the weekend, she is having more right anterior chest discomfort now and none on the left chest/neck or arm.  No heart palpitations.  The chest discomfort can be  present when she wakes up in the morning, it can last a few minutes, be gone for 30 minutes, and then it returns and lasts just a  few minutes. It comes and goes throughout the day. She has found no triggers. This is not exertional or positional.  She has been taking Tylenol arthritis which  will resolve the discomfort for 5 hours or so and then it returns again. She has noted just a slight intermittent shortness of breath.  No cough, sputum production, fevers chills, palpitations or dizziness.  She did feel lightheaded on Wednesday when she went to the store, but she had not eaten  anything all day long and the symptom resolved after she ate a salad.  She has not had any further lightheadedness and denies any dizziness. She does have chronic neck pain and recalls when this first started, she had pain in the right chest that went up her right shoulder and right neck. This right shoulder/neck pain has resolved.  No skin rash.   She does present on Eliquis 5 mg twice daily for history of PE 2002 following her gallbladder surgery.  She had a PE in 2016 unable to find a cause.  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  She has been followed by hematologist Dr. Grayland Ormond.  She has had no interruption in her Eliquis dose.  Her DVTs in her arms presented with antecubital area pain and she has had no pain in her arms at this time.  She has never had a DVT in the legs.  No calf pain, calf swelling or tenderness. Patient has had no prolonged car rides or airplane rides.  No recent surgery.  She last saw her cardiologist in 2019.  She has a history of coronary vasospasm, CAD, chronic diastolic heart failure, essential hypertension.  She has been taking her Coreg 3.125 mg twice daily with meals, Eliquis 5 mg twice daily, Lasix 20 mg daily as needed, Klor-Con 20  mEq tablet.  She has not used nitroglycerin Sl that she has in her possession.    Past Medical History:  Diagnosis Date  . Acute right-sided low back pain with right-sided sciatica 10/23/2017  . Anemia   . Bilateral renal cysts   . CAD (coronary artery disease) 09/28/2016  . Chest pain 10/14/2015  . Chronic back pain    "upper and lower" (09/19/2014)  . Chronic shoulder pain (Location of Primary Source of Pain) (Left) 01/29/2016  . Colitis 04/08/2016  . Coronary vasospasm ([place]) 12/09/2014  . Depression    "major" (09/19/2014)  . DVT (deep venous thrombosis) ([place])   . Esophageal spasm   . Essential hypertension 10/28/2013  . Gallstones   . Gastritis   . Gastroenteritis   . GERD (gastroesophageal  reflux disease)   . Headache    "couple /month lately" (09/19/2014)  . Heart disease 12/09/2016  . High blood pressure   . High cholesterol   . History of blood transfusion 1985   related to hysterectomy  . History of nuclear stress test    Myoview 1/19:  EF 65, no ischemia or scar  . History of pulmonary embolus (PE) 09/19/2014  . Hx of cervical spine surgery 01/29/2016  . 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 Rule out Paget's disease of the bone with blood work.   . Hyperlipidemia 10/28/2013  . Hypertension   . IBS (irritable bowel syndrome)   . Lumbar central spinal stenosis (moderate at L4-5; mild at L2-3 and L3-4) 01/29/2016  . Migraine    "a few times/yr" (09/19/2014)  . Myocardial infarction ([place]) 10/2010 X 3   "while hospitalized"  . Neck pain 01/29/2016  . Osteoarthritis    "qwhere; mostly around my joints" (09/19/2014)  . Pleural effusion, bilateral 07/31/2015  . Pneumonia 04/2014  . PTSD (post-traumatic stress disorder)   . Pulmonary embolism ([place]) 04/2001; 09/19/2014   "after gallbladder OR; "  . Schizoaffective disorder ([place])   . Schizoaffective disorder, depressive type ([place]) 08/28/2016  . Sleep apnea    "mild" (09/19/2014)  . Snoring    a. sleep study 5/16: No OSA  . SOB (shortness of breath) 07/31/2015  . Spinal stenosis   . Spondylosis   . Suicide attempt ([place]) 08/27/2016  . Type II diabetes mellitus ([place])    "dx'd in 2007; lost weight; no RX for ~ 4 yr now" (09/19/2014)  . Wheezing 02/25/2017    Past Surgical History:  Procedure Laterality Date  . ABDOMINAL HYSTERECTOMY  1985  . ANTERIOR CERVICAL DECOMP/DISCECTOMY FUSION  10/2012   in [place] C5-C7   . APPENDECTOMY  1985  . BACK SURGERY    . BREAST CYST EXCISION Right   . BREAST EXCISIONAL BIOPSY Right YRS AGO   NEG  . CARDIAC CATHETERIZATION   2000's; 2009  . CHOLECYSTECTOMY  2002  . COLONOSCOPY WITH PROPOFOL N/A 12/12/2016   Procedure: COLONOSCOPY WITH PROPOFOL;  Surgeon: [name], MD;  Location: [facility] ENDOSCOPY;  Service: Endoscopy;  Laterality: N/A;  . COLONOSCOPY WITH PROPOFOL N/A 02/17/2017   Procedure: COLONOSCOPY WITH PROPOFOL;  Surgeon: [name], MD;  Location: [facility] ENDOSCOPY;  Service: Gastroenterology;  Laterality: N/A;  . ESOPHAGOGASTRODUODENOSCOPY (EGD) WITH PROPOFOL N/A 02/17/2017   Procedure: ESOPHAGOGASTRODUODENOSCOPY (EGD) WITH PROPOFOL;  Surgeon: [name], MD;  Location: [facility] ENDOSCOPY;  Service: Gastroenterology;  Laterality: N/A;  . ESOPHAGOGASTRODUODENOSCOPY (EGD) WITH PROPOFOL N/A 03/07/2019   Procedure: ESOPHAGOGASTRODUODENOSCOPY (EGD) WITH PROPOFOL;  Surgeon: [name], MD;  Location: [facility] ENDOSCOPY;  Service: Gastroenterology;  Laterality: N/A;  . EXCISION/RELEASE BURSA HIP Right 12/1984  . HERNIA REPAIR  1985  . LEFT HEART CATHETERIZATION WITH CORONARY ANGIOGRAM N/A 12/09/2014   Procedure: LEFT HEART CATHETERIZATION WITH CORONARY ANGIOGRAM;  Surgeon: Burnell Blanks, MD;  Location: [facility] CATH LAB;  Service: Cardiovascular;  Laterality: N/A;  . TONSILLECTOMY AND ADENOIDECTOMY  ~ 11-07-1966  . TOTAL HIP ARTHROPLASTY Left 04-06-2014  . UMBILICAL HERNIA REPAIR  1985  . VENA CAVA FILTER PLACEMENT  03/2014    Family History  Problem Relation Age of Onset  . Stroke Mother        Deceased  . Lung cancer Father        Deceased  . Healthy Daughter   . Healthy Son        x 2  . Other Son        Suicide  . Cancer Brother   . Heart attack Neg Hx     Social History   Socioeconomic History  . Marital status: Divorced    Spouse name: Not on file  . Number of children: Not on file  . Years of education: Not on file  . Highest education level: Not on file  Occupational History  . Not on file  Tobacco Use  . Smoking status: Former Smoker    Packs/day: 1.50    Years: 10.00    Pack years: 15.00    Types: Cigarettes    Quit date: 04/12/1979    Years since quitting: 41.1  . Smokeless tobacco: Never Used  Vaping Use  . Vaping Use: Never  used  Substance and Sexual Activity  . Alcohol use: No    Alcohol/week: 0.0 standard drinks  . Drug use: No  . Sexual activity: Not Currently  Other Topics Concern  . Not on file  Social History Narrative   Lives with fiancee in an apartment on the first floor.  Has 3 grown children.  One child passed away.  On disability 1994/11/07 - 2000, 2007 - current.     Education: some college.   Social Determinants of Health   Financial Resource Strain:   . Difficulty of Paying Living Expenses: Not on file  Food Insecurity: No Food Insecurity  . Worried About Charity fundraiser in the Last Year: Never true  . Ran Out of Food in the Last Year: Never true  Transportation Needs: No Transportation Needs  . Lack of Transportation (Medical): No  . Lack of Transportation (Non-Medical): No  Physical Activity:   . Days of Exercise per Week: Not on file  . Minutes of Exercise per Session: Not on file  Stress:   . Feeling of Stress : Not on file  Social Connections:   . Frequency of Communication with Friends and Family: Not on file  . Frequency of Social Gatherings with Friends and Family: Not on file  . Attends Religious Services: Not on file  . Active Member of Clubs or Organizations: Not on file  . Attends Archivist Meetings: Not on file  . Marital Status: Not on file  Intimate Partner Violence:   . Fear of Current or Ex-Partner: Not on file  . Emotionally Abused: Not on file  . Physically Abused: Not on file  . Sexually Abused: Not on file    Outpatient Medications Prior to Visit  Medication Sig Dispense Refill  . acetaminophen (TYLENOL) 500 MG tablet Take 1 tablet (500 mg total) by mouth every 6 (six) hours as needed. [place]  tablet 0  . baclofen (LIORESAL) 10 MG tablet TAKE 1 TABLET BY MOUTH THREE TIMES A DAY AS NEEDED FOR MUSCLE SPASMS 60 tablet 1  . carvedilol (COREG) 3.125 MG tablet TAKE 1 TABLET (3.125 MG TOTAL) BY MOUTH 2 (TWO) TIMES DAILY WITH A MEAL. 180 tablet 3  .  cetirizine (ZYRTEC) 10 MG tablet TAKE 1 TABLET BY MOUTH EVERY DAY 90 tablet 3  . diclofenac sodium (VOLTAREN) 1 % GEL Apply 2 g topically 4 (four) times daily. Right arm/shoulder arthritis 1 Tube 2  . dicyclomine (BENTYL) 20 MG tablet TAKE 1 TABLET BY MOUTH THREE TIMES A DAY AS NEEDED FOR MUSCLE SPASMS 270 tablet 0  . diphenhydrAMINE (BENADRYL) 25 MG tablet Take 1 tablet (25 mg total) by mouth every 6 (six) hours. (Patient taking differently: Take 25 mg by mouth every 8 (eight) hours as needed. ) 12 tablet 0  . ELIQUIS 5 MG TABS tablet TAKE 1 TABLET BY MOUTH TWICE A DAY 60 tablet 5  . empagliflozin (JARDIANCE) 25 MG TABS tablet Take 1 tablet (25 mg total) by mouth daily. 90 tablet 1  . escitalopram (LEXAPRO) 20 MG tablet TAKE 1 TABLET BY MOUTH EVERY DAY 90 tablet 1  . Evolocumab (REPATHA SURECLICK) 016 MG/ML SOAJ Inject 140 mg into the skin every 14 (fourteen) days. 6 mL 2  . famotidine (PEPCID) 20 MG tablet TAKE 1 TABLET BY MOUTH EVERYDAY AT BEDTIME. 90 tablet 1  . furosemide (LASIX) 20 MG tablet TAKE 1 TABLET (20 MG TOTAL) BY MOUTH DAILY AS NEEDED FOR EDEMA. 90 tablet 1  . gabapentin (NEURONTIN) 600 MG tablet TAKE 1 TABLET IN THE AM AND 1.5 TABLETS AT BEDTIME 225 tablet 1  . glucose blood test strip Check once daily, dispense per patient and insurance preference, dx code E11.9 100 each 12  . isosorbide mononitrate (IMDUR) 30 MG 24 hr tablet TAKE 1.5 TABLETS (45 MG TOTAL) BY MOUTH DAILY. (Patient taking differently: 30 mg daily. ) 135 tablet 3  . JANUVIA 100 MG tablet TAKE 1 TABLET BY MOUTH EVERY DAY 90 tablet 1  . KLOR-CON M20 20 MEQ tablet TAKE 1 TABLET (20 MEQ TOTAL) BY MOUTH DAILY AS NEEDED (TAKE WITH LASIX). 90 tablet 1  . levothyroxine (SYNTHROID) 25 MCG tablet TAKE 1 TABLET BY MOUTH BEFORE BREAKFAST 2-3 HOURS BEFORE TAKING ANY OTHER MEDICATION OR SUPPLEMENT 90 tablet 3  . lidocaine (LIDODERM) 5 % Place 1 patch onto the skin daily. Remove & Discard patch within 12 hours. 30 patch 0  .  Multiple Vitamins-Minerals (SUPER THERA VITE M) TABS Take by mouth.    . nitroGLYCERIN (NITROSTAT) 0.4 MG SL tablet Place 1 tablet (0.4 mg total) under the tongue every 5 (five) minutes as needed for chest pain. 25 tablet 4  . OLANZapine (ZYPREXA) 10 MG tablet TAKE 1 TABLET BY MOUTH EVERYDAY AT BEDTIME 90 tablet 0  . ondansetron (ZOFRAN) 4 MG tablet TAKE 1 TABLET (4 MG TOTAL) BY MOUTH 2 (TWO) TIMES DAILY AS NEEDED FOR NAUSEA OR VOMITING. 30 tablet 0  . pantoprazole (PROTONIX) 40 MG tablet TAKE 1 TABLET BY MOUTH EVERY DAY 90 tablet 1  . rosuvastatin (CRESTOR) 40 MG tablet Take 1 tablet (40 mg total) by mouth daily. 90 tablet 1  . blood glucose meter kit and supplies KIT Dispense based on patient and insurance preference. Use once daily. Dx code E11.9. 1 each 0   No facility-administered medications prior to visit.    Allergies  Allergen Reactions  . Abilify [Aripiprazole] Other (  See Comments)    Jerking movement, slow motor skills  . Augmentin [Amoxicillin-Pot Clavulanate] Diarrhea and Nausea And Vomiting  . Bactrim [Sulfamethoxazole-Trimethoprim] Diarrhea  . Ceftin [Cefuroxime Axetil] Diarrhea  . Coconut Oil     Rash   . Diclofenac Other (See Comments)     Muscle Pain  . Dye Fdc Red [Red Dye]     "hair dye"  . Indocin [Indomethacin] Other (See Comments)    Migraines   . Linaclotide Diarrhea  . Lisinopril Diarrhea and Other (See Comments)    Other reaction(s): Dizziness, Vomiting  . Morphine Other (See Comments)    Chest Tightness  . Morphine And Related Other (See Comments)    Chest tightness   . Simvastatin Other (See Comments)    Muscle Pain  . Sulfa Antibiotics Diarrhea  . Sulfamethoxazole-Trimethoprim Diarrhea  . Wellbutrin [Bupropion] Other (See Comments)    Jerking movement Jerking movement Slurred speech  . Zetia [Ezetimibe]     Diarrhea   . Quetiapine Fumarate Palpitations  . Quetiapine Fumarate Palpitations    Review of Systems Pertinent positives none  history of present illness otherwise negative.   Objective:    Physical Exam Vitals reviewed.  Constitutional:      Appearance: She is obese.  HENT:     Head: Normocephalic and atraumatic.  Eyes:     Conjunctiva/sclera: Conjunctivae normal.     Pupils: Pupils are equal, round, and reactive to light.  Cardiovascular:     Rate and Rhythm: Normal rate and regular rhythm.     Pulses: Normal pulses.     Heart sounds: Normal heart sounds.  Pulmonary:     Effort: Pulmonary effort is normal.     Breath sounds: Normal breath sounds. No wheezing, rhonchi or rales.     Comments: She points to right anterior chest as area she feels intermittent  chest ache. No tenderness with palpation. No rash. No breast pain.  Chest:     Chest wall: No tenderness.  Abdominal:     Palpations: Abdomen is soft.     Tenderness: There is no abdominal tenderness.  Musculoskeletal:        General: No swelling or tenderness. Normal range of motion.     Cervical back: Normal range of motion and neck supple.     Right lower leg: No edema.     Left lower leg: No edema.     Comments: Decrease ROM neck to right lateral and somewhat to left. Decrease flexion- chronic. Normal ROM bilateral shoulders. No chest wall pain or tenderness with palpation.   Skin:    General: Skin is warm and dry.  Neurological:     General: No focal deficit present.     Mental Status: She is alert and oriented to person, place, and time.  Psychiatric:        Mood and Affect: Mood normal.        Behavior: Behavior normal.     BP 128/72 (BP Location: Left Arm, Patient Position: Sitting, Cuff Size: Normal)   Pulse 82   Temp 98.7 F (37.1 C) (Skin)   Ht _0  (1.676 m)   Wt 227 lb (103 kg)   SpO2 97%   BMI 36.64 kg/m  Wt Readings from Last 3 Encounters:  06/04/20 227 lb (103 kg)  03/02/20 225 lb 11.2 oz (102.4 kg)  02/07/20 229 lb 6.4 oz (104.1 kg)   Pulse Readings from Last 3 Encounters:  06/04/20 82  03/02/20 77  02/27/20  74  BP Readings from Last 3 Encounters:  06/04/20 128/72  03/02/20 117/75  02/27/20 (!) 93/57    Lab Results  Component Value Date   CHOL 169 03/12/2020   HDL 40.00 03/12/2020   LDLCALC 110 (H) 03/12/2020   LDLDIRECT 111.0 05/03/2020   TRIG 96.0 03/12/2020   CHOLHDL 4 03/12/2020      Health Maintenance Due  Topic Date Due  . COVID-19 Vaccine (1) Never done  . TETANUS/TDAP  Never done  . PNA vac Low Risk Adult (1 of 2 - PCV13) Never done  . FOOT EXAM  09/29/2019  . INFLUENZA VACCINE  Never done    There are no preventive care reminders to display for this patient.  Lab Results  Component Value Date   TSH 1.81 06/15/2019   Lab Results  Component Value Date   WBC 8.3 06/01/2020   HGB 13.3 06/01/2020   HCT 37.7 06/01/2020   MCV 90.8 06/01/2020   PLT 336 06/01/2020   Lab Results  Component Value Date   NA 141 06/01/2020   K 3.8 06/01/2020   CO2 23 06/01/2020   GLUCOSE 182 (H) 06/01/2020   BUN 7 (L) 06/01/2020   CREATININE 0.93 06/01/2020   BILITOT 0.5 05/03/2020   ALKPHOS 119 (H) 05/03/2020   AST 18 05/03/2020   ALT 21 05/03/2020   PROT 7.1 05/03/2020   ALBUMIN 4.0 05/03/2020   CALCIUM 9.1 06/01/2020   ANIONGAP 12 06/01/2020   GFR 82.13 01/27/2020   Lab Results  Component Value Date   CHOL 169 03/12/2020   Lab Results  Component Value Date   HDL 40.00 03/12/2020   Lab Results  Component Value Date   LDLCALC 110 (H) 03/12/2020   Lab Results  Component Value Date   TRIG 96.0 03/12/2020   Lab Results  Component Value Date   CHOLHDL 4 03/12/2020   Lab Results  Component Value Date   HGBA1C 7.4 (H) 01/27/2020   CLINICAL DATA:  Left-sided chest discomfort.  EXAM: CHEST - 2 VIEW  COMPARISON:  July 17, 2018  FINDINGS: Mild, diffuse, chronic appearing increased interstitial lung markings are seen. There is no evidence of acute infiltrate. A small right pleural effusion is noted. No pneumothorax is identified. The heart size and  mediastinal contours are within normal limits. A radiopaque fusion plate and screws are seen overlying the lower cervical spine. Radiopaque surgical clips are seen within the right upper quadrant.  IMPRESSION: 1. Small right pleural effusion.   Electronically Signed   By: Virgina Norfolk M.D.   On: 06/01/2020 17:35   Assessment & Plan:   Problem List Items Addressed This Visit      Cardiovascular and Mediastinum   Essential hypertension   Coronary vasospasm ([place])   Relevant Orders   Ambulatory referral to Cardiology   CAD (coronary artery disease)   Chronic diastolic heart failure ([place])   Relevant Orders   Ambulatory referral to Cardiology     Other   History of DVT (deep vein thrombosis) - Primary (Chronic)   Relevant Orders   D-Dimer, Quantitative   History of pulmonary embolus (PE)   Relevant Orders   D-Dimer, Quantitative    Other Visit Diagnoses    Abnormal EKG       Relevant Orders   Ambulatory referral to Cardiology   Intermittent right-sided chest pain       Relevant Orders   D-Dimer, Quantitative      No orders of the defined types were placed in this  encounter. Patient with onset 5 days ago mid chest discomfort described as heaviness,  pressure that radiated right and left side 2 hours after taking an extra dose of Coreg by accident.  She felt heart palpitations with this chest discomfort.  She did develop right shoulder to right neck pain and right anterior chest discomfort.  She went to the emergency department on Friday for evaluation and had routine labs that were unremarkable and a chest x-ray that revealed a small right pleural effusion, mild diffuse chronic appearing increased interstitial lung markings seen.  No evidence of acute infiltrate.  Heart size and mediastinal contours within normal. An EKG that showed sinus rhythm with ST T wave changes with possible ischemia inferior ischemia.  Troponin blood test was negative x2.  She left without being  seen by a provider d/t long wait.   She has a history of PE, DVT in arms, presents on Eliquis 5 mg twice daily and has been followed by Dr. Grayland Ormond.  She last saw her cardiologist several years ago.  Right anterior chest discomfort is intermittent.  It can last a few minutes to be gone for 30 minutes or so.  It is relieved with ibuprofen.  Patient has decreased range of motion when she turns her head to the right, and better range of motion when she turns to the left.  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  There is some tenderness to the C-spine.  We discussed possibility of musculoskeletal pain on the right side versus something more serious as a blood clot.    PLAN: Will check D-dimer. If D dimer is elevated, will order a CT of the chest angio plus PE with and without contrast.   I placed Urgent referral in to your Cardiologist to follow up on the ER EKG and your current symptoms.   We will call you with the result and make further recommendations.   OK to take Tylenol Arthritis as directed.  Treat neck arthritis as usual.   Addendum: I reviewed the chest CT angio with her and all of the findings: IMPRESSION: 1. No pulmonary emboli. 2. Small pleural effusions layering dependently with mild dependent pulmonary atelectasis. Findings quite similar to the study of 07/17/2018. 3. Aortic atherosclerosis. Coronary artery calcification.  No PE. Intermittent right sided chest discomfort. She has NTG already in her possession that is UTD and advised to take as needed. She has a Cardiology appt arranged in 2 days for abnl EKG done in the ED Fri. Advised not to do any strenuous activity or go on walks. Take it easy use NTG if she get CP and call 911 if symptoms worsen. Stay on Eliquis and usual meds.   Cardiology: [facility] office  The office address: [address] [place], [place] [address]  Follow-up: No follow-ups on file.   This visit occurred during the  SARS-CoV-2 public health emergency.  Safety protocols were in place, including screening questions prior to the visit, additional usage of staff PPE, and extensive cleaning of exam room while observing appropriate contact time as indicated for disinfecting solutions.   Denice Paradise, NP

## 2020-06-04 NOTE — Telephone Encounter (Signed)
Patient called in for results.

## 2020-06-05 ENCOUNTER — Telehealth: Payer: Self-pay | Admitting: Nurse Practitioner

## 2020-06-05 NOTE — Telephone Encounter (Signed)
Pt aware.

## 2020-06-05 NOTE — Telephone Encounter (Signed)
She sees Community education officer at Cardiology: [facility] office on 10/6. She wanted to know where that is located.  Please call her with the address below.   The office address: [address] [place], [place] [address]

## 2020-06-06 ENCOUNTER — Ambulatory Visit: Payer: Medicare HMO | Admitting: Family

## 2020-06-06 ENCOUNTER — Encounter: Payer: Self-pay | Admitting: Family

## 2020-06-06 ENCOUNTER — Other Ambulatory Visit: Payer: Self-pay

## 2020-06-06 ENCOUNTER — Ambulatory Visit: Payer: Medicare HMO | Admitting: Internal Medicine

## 2020-06-06 VITALS — BP 140/72 | HR 69 | Ht 66.0 in | Wt 229.2 lb

## 2020-06-06 DIAGNOSIS — Z86711 Personal history of pulmonary embolism: Secondary | ICD-10-CM

## 2020-06-06 DIAGNOSIS — E785 Hyperlipidemia, unspecified: Secondary | ICD-10-CM

## 2020-06-06 DIAGNOSIS — I5032 Chronic diastolic (congestive) heart failure: Secondary | ICD-10-CM

## 2020-06-06 DIAGNOSIS — R079 Chest pain, unspecified: Secondary | ICD-10-CM

## 2020-06-06 DIAGNOSIS — I2510 Atherosclerotic heart disease of native coronary artery without angina pectoris: Secondary | ICD-10-CM

## 2020-06-06 DIAGNOSIS — I10 Essential (primary) hypertension: Secondary | ICD-10-CM | POA: Diagnosis not present

## 2020-06-06 DIAGNOSIS — I2584 Coronary atherosclerosis due to calcified coronary lesion: Secondary | ICD-10-CM | POA: Diagnosis not present

## 2020-06-06 NOTE — Progress Notes (Signed)
Office Visit    Patient Name: Valerie Vazquez Date of Encounter: 06/06/2020  Primary Care Provider:  [name], MD Primary Cardiologist:  Kate Sable, MD Electrophysiologist:  None   Chief Complaint    Valerie Vazquez is a 64 y.o. 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   Past Medical History    Past Medical History:  Diagnosis Date  . Acute right-sided low back pain with right-sided sciatica 10/23/2017  . Anemia   . Bilateral renal cysts   . CAD (coronary artery disease) 09/28/2016  . Chest pain 10/14/2015  . Chronic back pain    "upper and lower" (09/19/2014)  . Chronic shoulder pain (Location of Primary Source of Pain) (Left) 01/29/2016  . Colitis 04/08/2016  . Coronary vasospasm ([place]) 12/09/2014  . Depression    "major" (09/19/2014)  . DVT (deep venous thrombosis) ([place])   . Esophageal spasm   . Essential hypertension 10/28/2013  . Gallstones   . Gastritis   . Gastroenteritis   . GERD (gastroesophageal reflux disease)   . Headache    "couple /month lately" (09/19/2014)  . Heart disease 12/09/2016  . High blood pressure   . High cholesterol   . History of blood transfusion 1985   related to hysterectomy  . History of nuclear stress test    Myoview 1/19:  EF 65, no ischemia or scar  . History of pulmonary embolus (PE) 09/19/2014  . Hx of cervical spine surgery 01/29/2016  . 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 Rule out Paget's disease of the bone with blood work.   . Hyperlipidemia 10/28/2013  . Hypertension   . IBS (irritable bowel syndrome)   . Lumbar central spinal stenosis (moderate at L4-5; mild at L2-3 and L3-4) 01/29/2016  . Migraine    "a few times/yr" (09/19/2014)  . Myocardial infarction ([place]) 10/2010 X 3   "while hospitalized"  . Neck pain 01/29/2016  . Osteoarthritis    "qwhere; mostly around my  joints" (09/19/2014)  . Pleural effusion, bilateral 07/31/2015  . Pneumonia 04/2014  . PTSD (post-traumatic stress disorder)   . Pulmonary embolism ([place]) 04/2001; 09/19/2014   "after gallbladder OR; "  . Schizoaffective disorder ([place])   . Schizoaffective disorder, depressive type ([place]) 08/28/2016  . Sleep apnea    "mild" (09/19/2014)  . Snoring    a. sleep study 5/16: No OSA  . SOB (shortness of breath) 07/31/2015  . Spinal stenosis   . Spondylosis   . Suicide attempt ([place]) 08/27/2016  . Type II diabetes mellitus ([place])    "dx'd in 2007; lost weight; no RX for ~ 4 yr now" (09/19/2014)  . Wheezing 02/25/2017   Past Surgical History:  Procedure Laterality Date  . ABDOMINAL HYSTERECTOMY  1985  . ANTERIOR CERVICAL DECOMP/DISCECTOMY FUSION  10/2012   in [place] C5-C7   . APPENDECTOMY  1985  . BACK SURGERY    . BREAST CYST EXCISION Right   . BREAST EXCISIONAL BIOPSY Right YRS AGO   NEG  . CARDIAC CATHETERIZATION   2000's; 2009  . CHOLECYSTECTOMY  2002  . COLONOSCOPY WITH PROPOFOL N/A 12/12/2016   Procedure: COLONOSCOPY WITH PROPOFOL;  Surgeon: [name], MD;  Location: [facility] ENDOSCOPY;  Service: Endoscopy;  Laterality: N/A;  . COLONOSCOPY WITH PROPOFOL N/A 02/17/2017   Procedure: COLONOSCOPY WITH PROPOFOL;  Surgeon: [name], MD;  Location: [facility]  ENDOSCOPY;  Service: Gastroenterology;  Laterality: N/A;  . ESOPHAGOGASTRODUODENOSCOPY (EGD) WITH PROPOFOL N/A 02/17/2017   Procedure: ESOPHAGOGASTRODUODENOSCOPY (EGD) WITH PROPOFOL;  Surgeon: [name], MD;  Location: [facility] ENDOSCOPY;  Service: Gastroenterology;  Laterality: N/A;  . ESOPHAGOGASTRODUODENOSCOPY (EGD) WITH PROPOFOL N/A 03/07/2019   Procedure: ESOPHAGOGASTRODUODENOSCOPY (EGD) WITH PROPOFOL;  Surgeon: [name], MD;  Location: [facility] ENDOSCOPY;  Service: Gastroenterology;  Laterality: N/A;  . EXCISION/RELEASE BURSA HIP Right 12/1984  . HERNIA REPAIR  1985  . LEFT HEART CATHETERIZATION WITH CORONARY ANGIOGRAM N/A 12/09/2014   Procedure:  LEFT HEART CATHETERIZATION WITH CORONARY ANGIOGRAM;  Surgeon: Burnell Blanks, MD;  Location: [facility] CATH LAB;  Service: Cardiovascular;  Laterality: N/A;  . TONSILLECTOMY AND ADENOIDECTOMY  ~ 1968  . TOTAL HIP ARTHROPLASTY Left 04-06-2014  . UMBILICAL HERNIA REPAIR  1985  . VENA CAVA FILTER PLACEMENT  03/2014    Allergies  Allergies  Allergen Reactions  . Abilify [Aripiprazole] Other (See Comments)    Jerking movement, slow motor skills  . Augmentin [Amoxicillin-Pot Clavulanate] Diarrhea and Nausea And Vomiting  . Bactrim [Sulfamethoxazole-Trimethoprim] Diarrhea  . Ceftin [Cefuroxime Axetil] Diarrhea  . Coconut Oil     Rash   . Diclofenac Other (See Comments)     Muscle Pain  . Dye Fdc Red [Red Dye]     "hair dye"  . Indocin [Indomethacin] Other (See Comments)    Migraines   . Linaclotide Diarrhea  . Lisinopril Diarrhea and Other (See Comments)    Other reaction(s): Dizziness, Vomiting  . Morphine Other (See Comments)    Chest Tightness  . Morphine And Related Other (See Comments)    Chest tightness   . Simvastatin Other (See Comments)    Muscle Pain  . Sulfa Antibiotics Diarrhea  . Sulfamethoxazole-Trimethoprim Diarrhea  . Wellbutrin [Bupropion] Other (See Comments)    Jerking movement Jerking movement Slurred speech  . Zetia [Ezetimibe]     Diarrhea   . Quetiapine Fumarate Palpitations  . Quetiapine Fumarate Palpitations    History of Present Illness    Valerie Vazquez is a 64 y.o. female with a hx of schizoaffective disorder, DM, HTN, diastolic heart failure, PE/DVT on Eliquis s/p IVC filter, cervical arthritis last seen 06/2019 via telemedicine by Estella Husk, PA.  Previous PE in 2002 following gallbladder surgery.  PE in 2016 with no identifiable cause.  She had 2 DVTs in bilateral arms in 2008 and again in her right arm in 2013.  She follows with Dr. Grayland Ormond.  She is admitted to [facility] 12/2014 with chest pain.  Initial EKG normal but she had dynamic ST  changes with recurrent chest pain so code STEMI called.  Emergent cardiac cath with no evidence of coronary artery disease.  Troponin negative.  She was started on diltiazem and Imdur for possible vasospasm.  Admitted to [facility] 09/2016 with chest pain, nuclear stress test no ischemia.  Echocardiogram 03/2019 performed for shortness of breath with LVEF 60 to 65%, impaired diastolic dysfunction.  She was last seen by cardiology via telemedicine 06/2019.  At the time developing was discontinued due to orthostatic hypotension.  ED visit 06/01/2020 with heavy central left-sided chest discomfort.  This occurred after taking an extra dose of her carvedilol the day before.  She noted chest pain, fluttering, pressure, skipping beats.  She did not stay to be evaluated.  EKG by my independent review shows normal sinus rhythm 77 bpm with TWI noted in leads II, III, aVF as well as V4-V6.  Chest x-ray showed small right  pleural effusion, mild diffuse chronic appearing increased interstitial lung markings, no acute infiltrate.  Seen by primary care 06/04/2020.  She noted more right anterior chest discomfort over the prior weekend with no recurrent left-sided discomfort, no palpitations.  The pain was relieved by Tylenol arthritis.  She reported no interruption to her Eliquis dosing.  D-dimer was checked and was elevated.  CT angio chest with no PE, small pleural effusions with mild dependent pulmonary atelectasis similar to study 07/2018, aortic atherosclerosis, coronary calcification.  Presents today for follow-up.  Prefers to follow with [place] [facility] location is closer to her home.  Tells me last Wednesday 05/30/20 "when all this came down" she accidentally took an extra dose of her Coreg. Felt like her heart was "flipping out like a fish".  This resolved by the next day.    She was seen in the ED 06/01/2020 with chest discomfort.  After leaving the ED she laid down and used a heating pack which helped the pain  some.  Tells me since that time her chest discomfort has predominantly been in the right side.  Occurs with rest and activity.  Tells me she notices even this morning when lying down.  Does note that it feels similar to when she was previously diagnosed with costochondritis.  It does improve with Tylenol.  It is not exacerbated by activity and not associated with shortness of breath. Reports no shortness of breath nor dyspnea on exertion.   EKGs/Labs/Other Studies Reviewed:   The following studies were reviewed today:  CT angio chest 06/04/20 FINDINGS: Cardiovascular: Pulmonary arterial opacification is good. No pulmonary emboli. Mild atherosclerotic calcification in the aortic root region. No aneurysm or dissection. Some coronary artery calcification is present. Heart size is normal. No pericardial fluid.   Mediastinum/Nodes: No mediastinal or hilar mass or lymphadenopathy.   Lungs/Pleura: Small pleural effusions layering dependently. Mild dependent pulmonary atelectasis. Findings quite similar to the study of 07/17/2018. Non dependent lungs are well aerated and clear.   Upper Abdomen: Normal.  Previous cholecystectomy.   Musculoskeletal: Ordinary mild thoracic degenerative changes.   Review of the MIP images confirms the above findings.   IMPRESSION: 1. No pulmonary emboli. 2. Small pleural effusions layering dependently with mild dependent pulmonary atelectasis. Findings quite similar to the study of 07/17/2018. 3. Aortic atherosclerosis. Coronary artery calcification.   Aortic Atherosclerosis (ICD10-I70.0).  Echo 03/08/19  1. The left ventricle has normal systolic function with an ejection  fraction of 60-65%. The cavity size was normal. There is mildly increased  left ventricular wall thickness. Left ventricular diastolic Doppler  parameters are consistent with impaired  relaxation.   2. The right ventricle has normal systolic function. The cavity was  normal. There is no  increase in right ventricular wall thickness. Right  ventricular systolic pressure is normal (15-20 mmHg plus central venous  pressure).   3. Right atrial size was mildly dilated.   4. The aortic valve is grossly normal.   5. The aortic root is normal in size and structure.   6. The interatrial septum was not well visualized.   Lexiscan 09/2016  Myocardial perfusion is normal.   This is a low risk study.  Overall left ventricular systolic function was normal.    LV cavity size is normal.  Nuclear stress EF:  50%.  The left ventricular ejection fraction is mildly decreased (45-54%).     EKG:  EKG is  ordered today.  The ekg ordered today demonstrates normal sinus rhythm 69 bpm.  Continued to have inversion in lead III.  T wave inversion in lateral leads has resolved compared to previous EKG.  Recent Labs: 06/15/2019: TSH 1.81 05/03/2020: ALT 21 06/01/2020: BUN 7; Creatinine, Ser 0.93; Hemoglobin 13.3; Platelets 336; Potassium 3.8; Sodium 141  Recent Lipid Panel    Component Value Date/Time   CHOL 169 03/12/2020 0935   TRIG 96.0 03/12/2020 0935   HDL 40.00 03/12/2020 0935   CHOLHDL 4 03/12/2020 0935   VLDL 19.2 03/12/2020 0935   LDLCALC 110 (H) 03/12/2020 0935   LDLDIRECT 111.0 05/03/2020 0800    Home Medications   Current Meds  Medication Sig  . acetaminophen (TYLENOL) 500 MG tablet Take 1 tablet (500 mg total) by mouth every 6 (six) hours as needed.  . baclofen (LIORESAL) 10 MG tablet TAKE 1 TABLET BY MOUTH THREE TIMES A DAY AS NEEDED FOR MUSCLE SPASMS  . carvedilol (COREG) 3.125 MG tablet TAKE 1 TABLET (3.125 MG TOTAL) BY MOUTH 2 (TWO) TIMES DAILY WITH A MEAL.  . cetirizine (ZYRTEC) 10 MG tablet TAKE 1 TABLET BY MOUTH EVERY DAY  . diclofenac sodium (VOLTAREN) 1 % GEL Apply 2 g topically 4 (four) times daily. Right arm/shoulder arthritis  . dicyclomine (BENTYL) 20 MG tablet TAKE 1 TABLET BY MOUTH THREE TIMES A DAY AS NEEDED FOR MUSCLE SPASMS  . diphenhydrAMINE (BENADRYL) 25 MG  tablet Take 1 tablet (25 mg total) by mouth every 6 (six) hours. (Patient taking differently: Take 25 mg by mouth every 8 (eight) hours as needed. )  . ELIQUIS 5 MG TABS tablet TAKE 1 TABLET BY MOUTH TWICE A DAY  . empagliflozin (JARDIANCE) 25 MG TABS tablet Take 1 tablet (25 mg total) by mouth daily.  Marland Kitchen escitalopram (LEXAPRO) 20 MG tablet TAKE 1 TABLET BY MOUTH EVERY DAY  . famotidine (PEPCID) 20 MG tablet TAKE 1 TABLET BY MOUTH EVERYDAY AT BEDTIME.  . furosemide (LASIX) 20 MG tablet TAKE 1 TABLET (20 MG TOTAL) BY MOUTH DAILY AS NEEDED FOR EDEMA.  Marland Kitchen gabapentin (NEURONTIN) 600 MG tablet TAKE 1 TABLET IN THE AM AND 1.5 TABLETS AT BEDTIME  . glucose blood test strip Check once daily, dispense per patient and insurance preference, dx code E11.9  . isosorbide mononitrate (IMDUR) 30 MG 24 hr tablet Take 30 mg by mouth daily.  Marland Kitchen JANUVIA 100 MG tablet TAKE 1 TABLET BY MOUTH EVERY DAY  . KLOR-CON M20 20 MEQ tablet TAKE 1 TABLET (20 MEQ TOTAL) BY MOUTH DAILY AS NEEDED (TAKE WITH LASIX).  Marland Kitchen levothyroxine (SYNTHROID) 25 MCG tablet TAKE 1 TABLET BY MOUTH BEFORE BREAKFAST 2-3 HOURS BEFORE TAKING ANY OTHER MEDICATION OR SUPPLEMENT  . lidocaine (LIDODERM) 5 % Place 1 patch onto the skin daily. Remove & Discard patch within 12 hours.  . Multiple Vitamins-Minerals (SUPER THERA VITE M) TABS Take by mouth.  . nitroGLYCERIN (NITROSTAT) 0.4 MG SL tablet Place 1 tablet (0.4 mg total) under the tongue every 5 (five) minutes as needed for chest pain.  Marland Kitchen OLANZapine (ZYPREXA) 10 MG tablet TAKE 1 TABLET BY MOUTH EVERYDAY AT BEDTIME  . ondansetron (ZOFRAN) 4 MG tablet TAKE 1 TABLET (4 MG TOTAL) BY MOUTH 2 (TWO) TIMES DAILY AS NEEDED FOR NAUSEA OR VOMITING.  . pantoprazole (PROTONIX) 40 MG tablet TAKE 1 TABLET BY MOUTH EVERY DAY  . rosuvastatin (CRESTOR) 40 MG tablet Take 1 tablet (40 mg total) by mouth daily.    Review of Systems    Review of Systems  Constitutional: Negative for chills, fever and malaise/fatigue.  Cardiovascular: Positive for chest pain. Negative for dyspnea on exertion, irregular heartbeat, leg swelling, near-syncope, orthopnea, palpitations and syncope.  Respiratory: Negative for cough, shortness of breath and wheezing.   Gastrointestinal: Negative for melena, nausea and vomiting.  Genitourinary: Negative for hematuria.  Neurological: Negative for dizziness, light-headedness and weakness.   All other systems reviewed and are otherwise negative except as noted above.  Physical Exam    VS:  BP 140/72 (BP Location: Left Arm, Patient Position: Sitting, Cuff Size: Large)   Pulse 69   Ht 5\' 6"  (1.676 m)   Wt 229 lb 4 oz (104 kg)   SpO2 98%   BMI 37.00 kg/m  , BMI Body mass index is 37 kg/m. GEN: Well nourished, overweight, well developed, in no acute distress. HEENT: normal. Neck: Supple, no JVD, carotid bruits, or masses. Cardiac: RRR, no murmurs, rubs, or gallops. No clubbing, cyanosis, edema.  Radials/DP/PT 2+ and equal bilaterally.  Respiratory:  Respirations regular and unlabored, clear to auscultation bilaterally. GI: Soft, nontender, nondistended, BS + x 4. MS: No deformity or atrophy. Chest wall nontender on palpation.  Skin: Warm and dry, no rash. Neuro:  Strength and sensation are intact. Psych: Normal affect. Assessment & Plan    1. Chest pain/coronary vasospasm/coronary artery calcification on CT -Episode in 2016 with normal coronary arteries by catheterization at that time.  Lexiscan 2018 low risk.  She reports chest pain over the last week that occurs both the right and left side at rest and with activity.  No associated dyspnea nor diaphoresis. Symptoms atypical for angina and more consistent with mulculoskeletal origin or previous costochondritis. However, ED visit 06/01/2020 she did have T wave inversion in inferior as well as lateral leads.  This was overall stable compared to previous.  EKG today shows continued T wave inversion in lead III with resolution of  lateral TWI. Due to coronary artery calcification on CT, risk factors (HTN, HLD, age, ethnicity) as well as EKG changes, plan for [employer]. Continue statin and beta blocker at present doses. No aspirin secondary to chronic anticoagulation.   2. HTN - BP well controlled. Continue current antihypertensive regimen.   3. HLD - LDL goal <70 in setting of coronary calcification on CT. Lipid panel 03/12/20 total cholesterol 169, HDL 40, LDL 110, triglycerides 96. She was started on Crestor 40mg  daily. Plan for repeat lipid panel with PCP.  If LDL remains greater than 70, recommend addition of Zetia 10 g daily.  4. History of PE - s/p IVC filter on Eliquis as managed by primary care.  Denies bleeding complications.  Recent CT angio without evidence of PE.   5. Chronic diastolic heart failure -euvolemic and well compensated on exam. Continue Coreg and PRN Lasix.  Disposition: Follow up in 4-6 weeks with Dr. Garen Lah or APP.   Loel Dubonnet, NP 06/06/2020, 12:51 PM

## 2020-06-06 NOTE — Patient Instructions (Addendum)
Medication Instructions:  No medication changes today.   *If you need a refill on your cardiac medications before your next appointment, please call your pharmacy*   Lab Work: No lab work today.   Testing/Procedures: Your physician has requested that you have a lexiscan myoview. Please follow instruction sheet, as given.  Follow-Up: At [facility], you and your health needs are our priority.  As part of our continuing mission to provide you with exceptional heart care, we have created designated Provider Care Teams.  These Care Teams include your primary Cardiologist (physician) and Advanced Practice Providers (APPs -  Physician Assistants and Nurse Practitioners) who all work together to provide you with the care you need, when you need it.  We recommend signing up for the patient portal called "MyChart".  Sign up information is provided on this After Visit Summary.  MyChart is used to connect with patients for Virtual Visits (Telemedicine).  Patients are able to view lab/test results, encounter notes, upcoming appointments, etc.  Non-urgent messages can be sent to your provider as well.   To learn more about what you can do with MyChart, go to [email or web address].    Your next appointment:   4-6 week(s)  The format for your next appointment:   In Person  Provider:   You may see Kate Sable, MD or one of the following Advanced Practice Providers on your designated Care Team:    Murray Hodgkins, NP  Christell Faith, PA-C  Marrianne Mood, PA-C  Laurann Montana, NP  Other Instructions   [place]  Your caregiver has ordered a Stress Test with nuclear imaging. The purpose of this test is to evaluate the blood supply to your heart muscle. This procedure is referred to as a "Non-Invasive Stress Test." This is because other than having an IV started in your vein, nothing is inserted or "invades" your body. Cardiac stress tests are done to find areas of poor blood flow  to the heart by determining the extent of coronary artery disease (CAD). Some patients exercise on a treadmill, which naturally increases the blood flow to your heart, while others who are  unable to walk on a treadmill due to physical limitations have a pharmacologic/chemical stress agent called Lexiscan . This medicine will mimic walking on a treadmill by temporarily increasing your coronary blood flow.   Please note: these test may take anywhere between 2-4 hours to complete  PLEASE REPORT TO [place] AT THE FIRST DESK WILL DIRECT YOU WHERE TO GO  Date of Procedure:_____________________________________  Arrival Time for Procedure:______________________________  Instructions regarding medication:   __X__ : Hold diabetes medication morning of procedure  __X__:  Hold betablocker(carvedilol) night before procedure and morning of procedure  __X__:  Hold other medications as follows:____Do not take Lasix the morning of the test _   PLEASE NOTIFY THE OFFICE AT LEAST 24 HOURS IN ADVANCE IF YOU ARE UNABLE TO [place].  [phone] AND  PLEASE NOTIFY NUCLEAR MEDICINE AT [facility] AT LEAST 24 HOURS IN ADVANCE IF YOU ARE UNABLE TO KEEP YOUR APPOINTMENT. [phone]  How to prepare for your Myoview test:  1. Do not eat or drink after midnight 2. No caffeine for 24 hours prior to test 3. No smoking 24 hours prior to test. 4. Your medication may be taken with water.  If your doctor stopped a medication because of this test, do not take that medication. 5. Ladies, please do not wear dresses.  Skirts or pants  are appropriate. Please wear a short sleeve shirt. 6. No perfume, cologne or lotion. 7. Wear comfortable walking shoes. No heels!

## 2020-06-14 ENCOUNTER — Telehealth: Payer: Self-pay

## 2020-06-14 NOTE — Telephone Encounter (Signed)
The PA for Praluent was approved from 09/02/2019-08/31/2020 for the patient, the pharmacy and the patient was notified.  Nivedita Mirabella,cma

## 2020-06-14 NOTE — Telephone Encounter (Signed)
Pt has pain in chest and ribs. She said she saw Dawson Bills on 06/04/20 for same thing. I transferred patient to access nurse to be triaged.

## 2020-06-15 NOTE — Telephone Encounter (Signed)
[facility] RECORD AccessNurse Patient Name: Valerie Vazquez Gender: Female DOB: [birth year] Age: 64 Y 2 M 12 D Return Phone Number: [phone] (Primary) Address: City/State/Zip: [place] [address] Client [place] Client Site [place] - Day Physician [name] - MD Contact Type Call Who Is Calling Patient / Member / Family / Caregiver Call Type Triage / Clinical Relationship To Patient Self Return Phone Number [phone] (Primary) Chief Complaint CHEST PAIN (>=21 years) - pain, pressure, heaviness or tightness Reason for Call Symptomatic / Request for [name] Translation No Nurse Assessment Nurse: Alinda Money, RN, Sarah Date/Time Eilene Ghazi Time): 06/14/2020 4:03:03 PM Confirm and document reason for call. If symptomatic, describe symptoms. ---Caller states both sides of ribs are hurting that wrap around to her back. States occasionally has chest pains. Was seen in the ED and they didn't find anything. States she was supposed to have cardiac stress test but she had to cancel. States pain has been going on for a couple of weeks but getting worse and having spasms. Pain 6/10. Does the patient have any new or worsening symptoms? ---Yes Will a triage be completed? ---Yes Related visit to physician within the last 2 weeks? ---Yes Does the PT have any chronic conditions? (i.e. diabetes, asthma, this includes High risk factors for pregnancy, etc.) ---Yes List chronic conditions. ---Blood clots, Is this a behavioral health or substance abuse call? ---No Guidelines Guideline Title Affirmed Question Affirmed Notes Nurse Date/Time Eilene Ghazi Time) Chest Pain Difficulty breathing Goins, RN, Judson Roch 06/14/2020 4:05:17 PM Disp. Time Eilene Ghazi Time) Disposition Final  User 06/14/2020 4:01:43 PM Send to Urgent Queue Nicholes Calamity 06/14/2020 4:08:16 PM Go to ED Now Yes Goins, RN, Daryl Eastern NOTE: All timestamps contained within this report are represented as [place] Standard Time. CONFIDENTIALTY NOTICE: This fax transmission is intended only for the addressee. It contains information that is legally privileged, confidential or otherwise protected from use or disclosure. If you are not the intended recipient, you are strictly prohibited from reviewing, disclosing, copying using or disseminating any of this information or taking any action in reliance on or regarding this information. If you have received this fax in error, please notify us immediately by telephone so that we can arrange for its return to [place]. Phone: [phone], Toll-Free: [phone], Fax: [phone] Page: 2 of 2 Call Id: [id number] [place] Disagree/Comply Comply Caller Understands Yes PreDisposition Call Doctor Care Advice Given Per Guideline GO TO ED NOW: * Go to the ED at ___________ [place] given per Chest Pain (Adult) guideline. Comments User: Sallyanne Kuster, RN Date/Time Eilene Ghazi Time): 06/14/2020 9:48:01 PM hx. diastolic heart failure Referrals GO TO FACILITY UNDECIDE

## 2020-06-15 NOTE — Telephone Encounter (Signed)
Patient says the pain is worse today, patient says the pain is in the ribcage bilateral sometimes and using a heating pad helps some, patient deep breathing makes it worse, patient speaking inn full sentences says when moving or deep breathing she rates the pain at a 6 on 0-10 scale 10 being the worst. This has been going on for sometime per patient just seems top be worse now.  Denies any injury , fever, chills or any other symptom.  Patient refused UC and ER recommendation of Access Nurses and refuses to go to UC today. Patient ask for the next available with PCP which was scheduled for 06/22/20. Advised patient if she becomes SOB she needs to be evaluated immediately or if pain starts to move patient voiced understanding and stated she will go to [facility] for worsening symptoms.

## 2020-06-18 NOTE — Telephone Encounter (Signed)
Patient was scheduled for wed. Instead of Friday.  Kekoa Fyock,cma

## 2020-06-18 NOTE — Telephone Encounter (Signed)
Noted from 06/15/20  Gae Bon does PCP have sooner appt than 10/22?  Call pt and check on her and if still not well rec [facility] urgent care before 06/22/20

## 2020-06-18 NOTE — Telephone Encounter (Signed)
Agree with Dr McLean-Scocuzza. If not feeling well she should be evaluated today at urgent care. I do appear to have an appointment on 10/20 at 2:45 pm if she declines urgent care.

## 2020-06-20 ENCOUNTER — Ambulatory Visit (INDEPENDENT_AMBULATORY_CARE_PROVIDER_SITE_OTHER): Payer: Medicare HMO | Admitting: Family Medicine

## 2020-06-20 ENCOUNTER — Other Ambulatory Visit: Payer: Self-pay

## 2020-06-20 ENCOUNTER — Encounter: Payer: Self-pay | Admitting: Family Medicine

## 2020-06-20 VITALS — BP 130/70 | HR 95 | Temp 98.8°F | Ht 66.0 in | Wt 229.6 lb

## 2020-06-20 DIAGNOSIS — N39498 Other specified urinary incontinence: Secondary | ICD-10-CM

## 2020-06-20 DIAGNOSIS — R159 Full incontinence of feces: Secondary | ICD-10-CM | POA: Diagnosis not present

## 2020-06-20 DIAGNOSIS — M545 Low back pain, unspecified: Secondary | ICD-10-CM | POA: Diagnosis not present

## 2020-06-20 NOTE — Patient Instructions (Signed)
Nice to see you. We will get an MRI scheduled for you. If you have any worsening symptoms to any degree you need to go to the emergency department for evaluation.

## 2020-06-20 NOTE — Progress Notes (Signed)
[name], MD Phone: [phone]  Valerie Vazquez is a 64 y.o. female who presents today for same-day visit.  Back pain: Patient notes chronic back issues though acutely had worsening back discomfort 3 weeks ago.  Notes started with some rib pain though it eventually settled in her low back.  Notes that the deep excruciating pain at times.  Hurts when she moves.  If she is laying down and still the pain improves.  She had a negative CT angiogram to rule out PE given her history.  She also followed up with cardiology and has a stress test scheduled.  She notes no numbness.  She feels though her legs are weak progressively over the last 3 weeks.  She does report stool and urine incontinence.  She notes she cannot move quick enough to get to the bathroom though at times has had stool incontinence while sleeping.  No saddle anesthesia.  She has tried Tylenol, muscle relaxer, and gabapentin with little benefit.  Social History   Tobacco Use  Smoking Status Former Smoker  . Packs/day: 1.50  . Years: 10.00  . Pack years: 15.00  . Types: Cigarettes  . Quit date: 04/12/1979  . Years since quitting: 41.2  Smokeless Tobacco Never Used     ROS see history of present illness  Objective  Physical Exam Vitals:   06/20/20 1459  BP: 130/70  Pulse: 95  Temp: 98.8 F (37.1 C)  SpO2: 95%    BP Readings from Last 3 Encounters:  06/20/20 130/70  06/06/20 140/72  06/04/20 128/72   Wt Readings from Last 3 Encounters:  06/20/20 229 lb 9.6 oz (104.1 kg)  06/06/20 229 lb 4 oz (104 kg)  06/04/20 227 lb (103 kg)    Physical Exam Constitutional:      General: She is not in acute distress.    Appearance: She is not diaphoretic.  Pulmonary:     Effort: Pulmonary effort is normal.  Musculoskeletal:     Comments: No midline spine tenderness, no midline spine step-off, no muscular back tenderness, no rib tenderness  Skin:    General: Skin is warm and dry.  Neurological:     Mental  Status: She is alert.     Comments: 5/5 strength bilateral quads, hamstrings, plantar flexion, and dorsiflexion, sensation light touch intact bilateral lower extremities, 2+ patellar reflexes, no clonus on ankle jerk testing, rectal tone intact, no saddle anesthesia on exam, Fulton Mole, CMA served as chaperone      Assessment/Plan: Please see individual problem list.  Problem List Items Addressed This Visit    Bilateral low back pain without sciatica - Primary    Patient with acute worsening of back pain with associated bowel and bladder incontinence.  Concerning that there could be some compressive pathology in her low back with her symptoms though bowel and bladder incontinence could be related to difficulty getting to the bathroom quickly enough.  Given the concern for compressive pathology we will order an MRI stat.  The patient was advised to go to the emergency department if her symptoms worsen to any degree.      Relevant Orders   MR Lumbar Spine Wo Contrast   Urinary incontinence   Relevant Orders   MR Lumbar Spine Wo Contrast    Other Visit Diagnoses    Full incontinence of feces       Relevant Orders   MR Lumbar Spine Wo Contrast     I have spent 32 minutes in the care  of this patient regarding history taking, documentation, exam, discussion of imaging with referral coordinator, placing orders.    This visit occurred during the SARS-CoV-2 public health emergency.  Safety protocols were in place, including screening questions prior to the visit, additional usage of staff PPE, and extensive cleaning of exam room while observing appropriate contact time as indicated for disinfecting solutions.    [name], MD [place]

## 2020-06-20 NOTE — Assessment & Plan Note (Signed)
Patient with acute worsening of back pain with associated bowel and bladder incontinence.  Concerning that there could be some compressive pathology in her low back with her symptoms though bowel and bladder incontinence could be related to difficulty getting to the bathroom quickly enough.  Given the concern for compressive pathology we will order an MRI stat.  The patient was advised to go to the emergency department if her symptoms worsen to any degree.

## 2020-06-21 ENCOUNTER — Encounter: Payer: Self-pay | Admitting: Family Medicine

## 2020-06-21 ENCOUNTER — Telehealth: Payer: Self-pay | Admitting: Family Medicine

## 2020-06-21 ENCOUNTER — Ambulatory Visit
Admission: RE | Admit: 2020-06-21 | Discharge: 2020-06-21 | Disposition: A | Discharge status: Home / Self Care | Payer: Medicare HMO | Source: Ambulatory Visit | Attending: Family Medicine | Admitting: Family Medicine

## 2020-06-21 DIAGNOSIS — M545 Low back pain, unspecified: Secondary | ICD-10-CM | POA: Diagnosis not present

## 2020-06-21 DIAGNOSIS — N39498 Other specified urinary incontinence: Secondary | ICD-10-CM | POA: Diagnosis not present

## 2020-06-21 DIAGNOSIS — R159 Full incontinence of feces: Secondary | ICD-10-CM | POA: Diagnosis not present

## 2020-06-21 NOTE — Telephone Encounter (Addendum)
Patient called in stated that she would like to go to [facility] [address] if the do them there CT

## 2020-06-21 NOTE — Telephone Encounter (Signed)
Ok. Lol pt already arrived at her this morning for the MRI

## 2020-06-21 NOTE — Telephone Encounter (Signed)
Hey, i'm not seeing that the pt has a CT order. :)

## 2020-06-21 NOTE — Telephone Encounter (Signed)
Don't know she called in and requested to go to imaging on [address]

## 2020-06-22 ENCOUNTER — Encounter: Payer: Self-pay | Admitting: Family Medicine

## 2020-06-22 ENCOUNTER — Telehealth: Payer: Medicare HMO | Admitting: Family Medicine

## 2020-06-22 NOTE — Telephone Encounter (Signed)
Pt called to get MRI results

## 2020-06-25 NOTE — Telephone Encounter (Signed)
See prior mychart encounter.

## 2020-07-01 ENCOUNTER — Other Ambulatory Visit: Payer: Self-pay | Admitting: Cardiovascular Disease

## 2020-07-04 ENCOUNTER — Other Ambulatory Visit: Payer: Self-pay | Admitting: Family Medicine

## 2020-07-05 NOTE — Telephone Encounter (Signed)
Can we do a PA for this?

## 2020-07-12 ENCOUNTER — Other Ambulatory Visit: Payer: Self-pay | Admitting: Internal Medicine

## 2020-07-15 ENCOUNTER — Encounter: Payer: Self-pay | Admitting: Family Medicine

## 2020-07-15 DIAGNOSIS — F411 Generalized anxiety disorder: Secondary | ICD-10-CM

## 2020-07-16 ENCOUNTER — Ambulatory Visit: Payer: Medicare HMO | Admitting: Cardiology

## 2020-07-16 ENCOUNTER — Other Ambulatory Visit: Payer: Self-pay

## 2020-07-16 DIAGNOSIS — F411 Generalized anxiety disorder: Secondary | ICD-10-CM

## 2020-07-16 IMAGING — US US PELVIS COMPLETE TRANSABD/TRANSVAG
1 series · 13 of 25 positions shown · non-contrast
Comparison: CT abdomen and pelvis 09/30/2016

CLINICAL DATA: LEFT ovarian cyst, follow-up



[Series 1: us pelvis complete transabd/transvag · 13 of 83 slices shown]
[im 1/83]
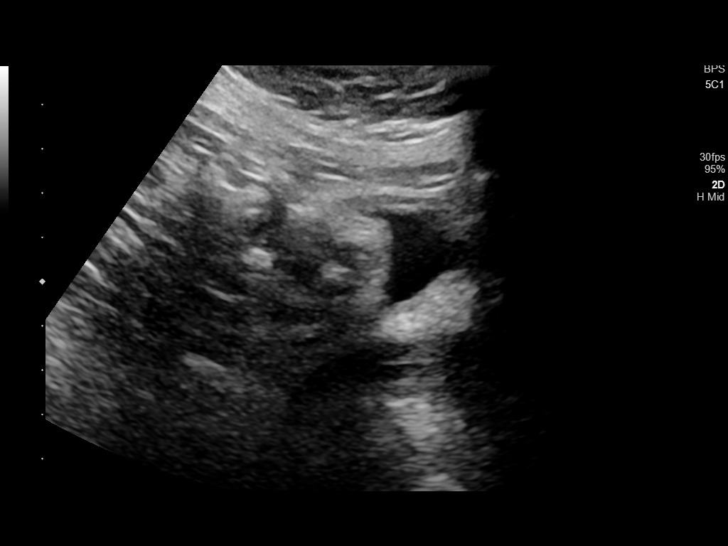
[im 7/83]
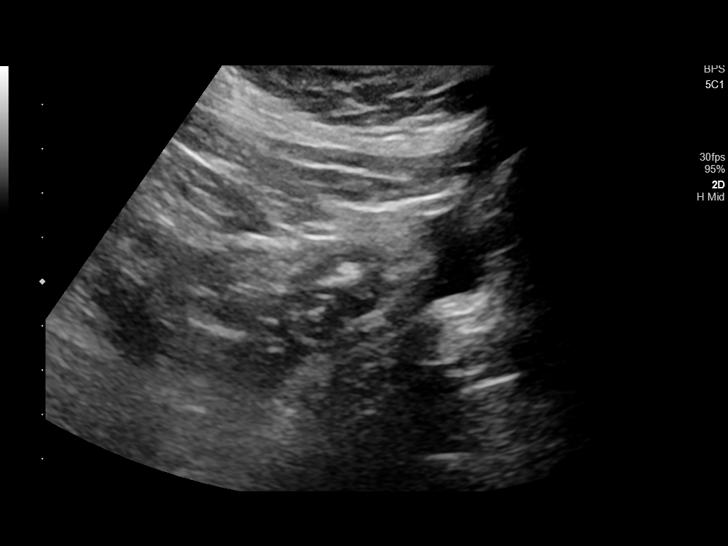
[im 14/83]
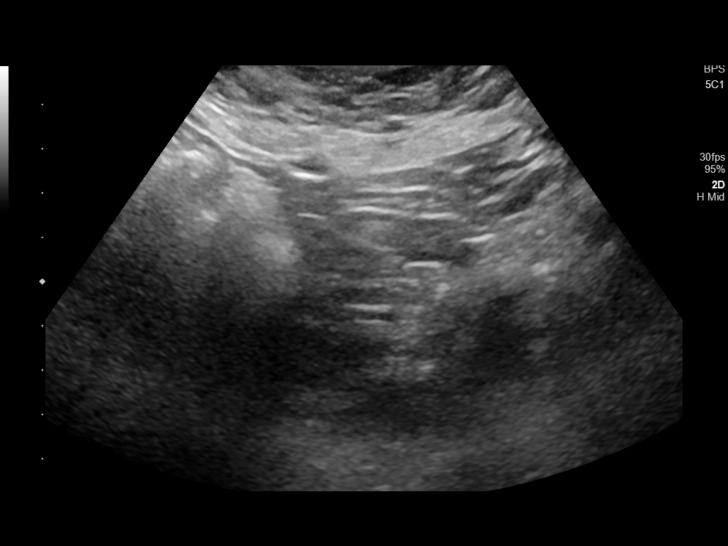
[im 21/83]
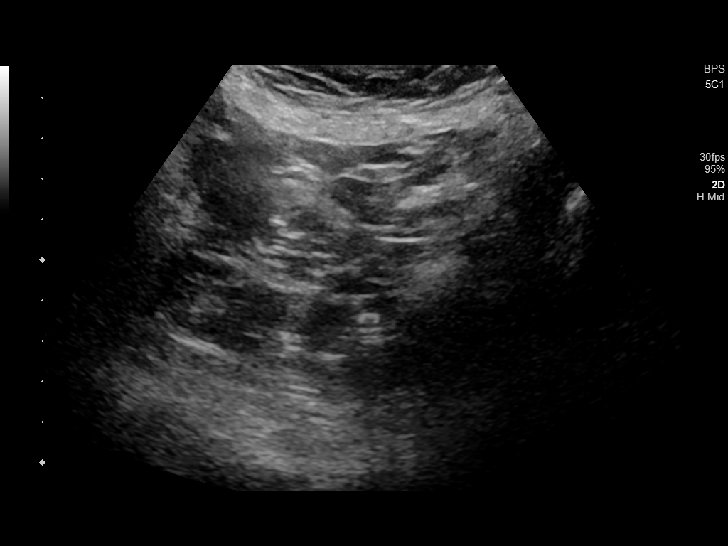
[im 28/83]
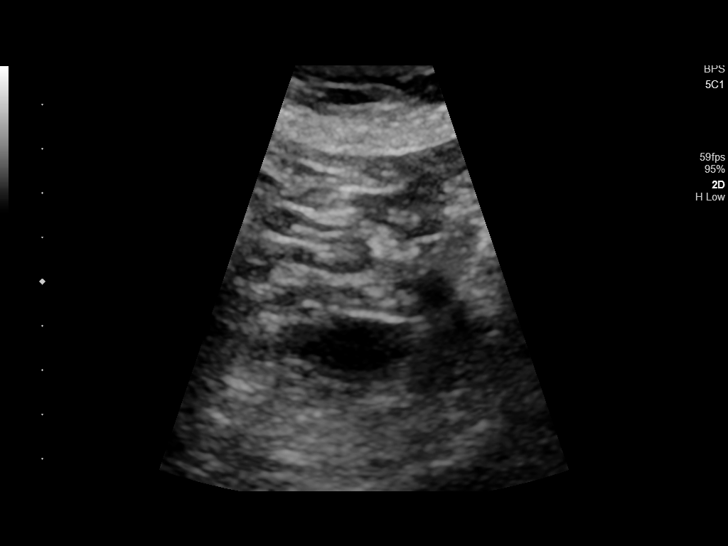
[im 35/83]
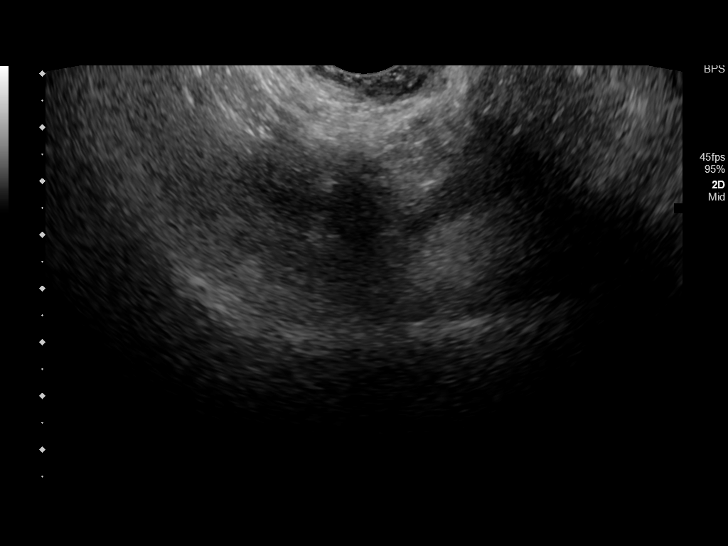
[im 42/83]
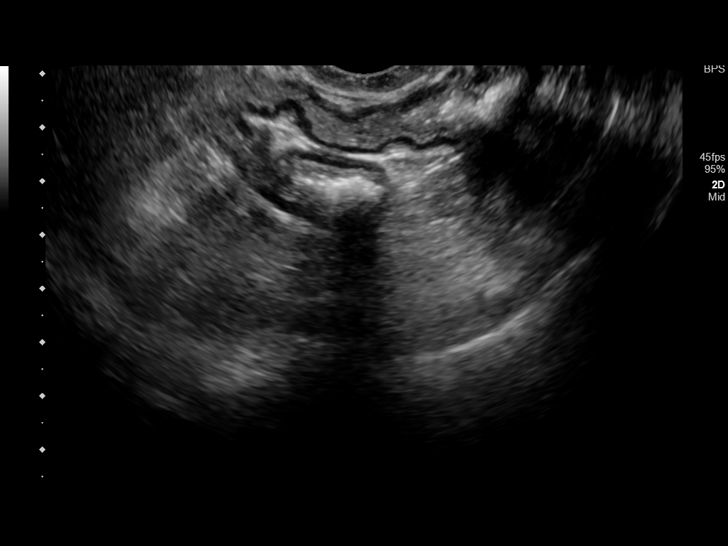
[im 48/83]
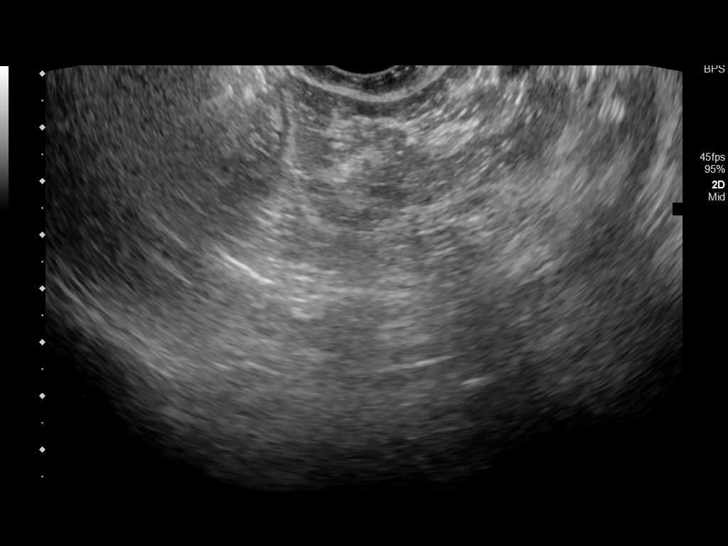
[im 55/83]
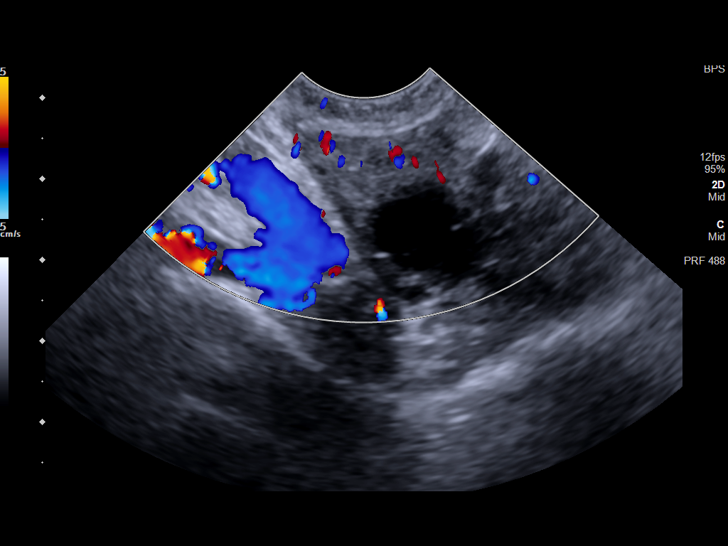
[im 62/83]
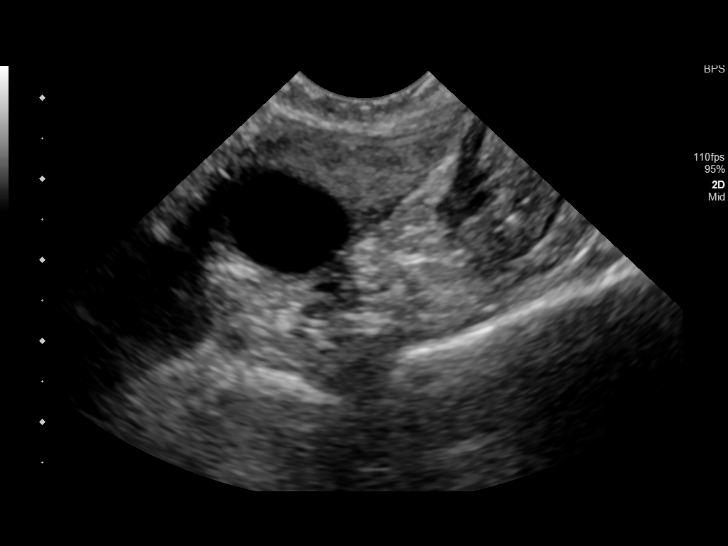
[im 69/83]
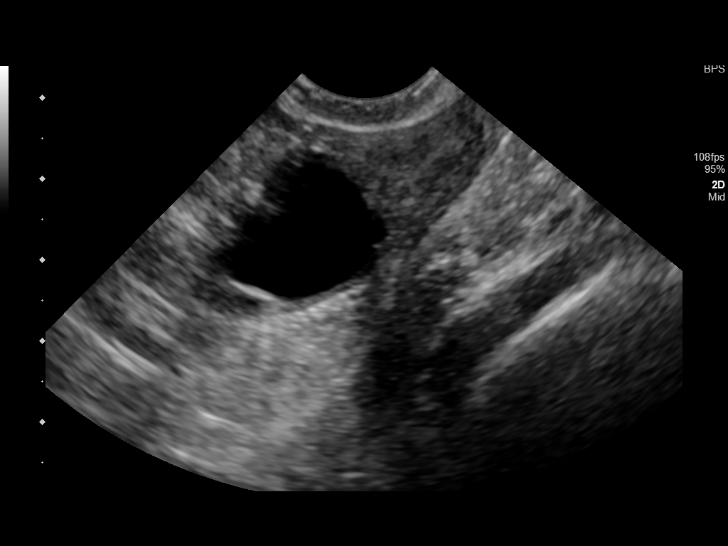
[im 76/83]
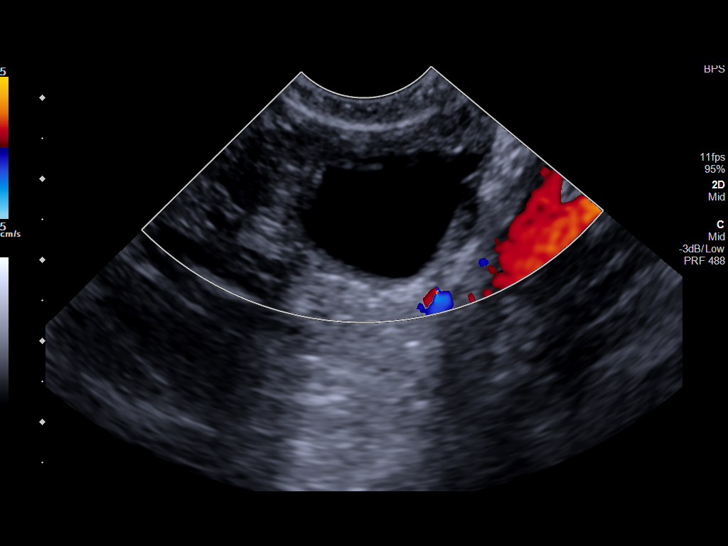
[im 83/83]
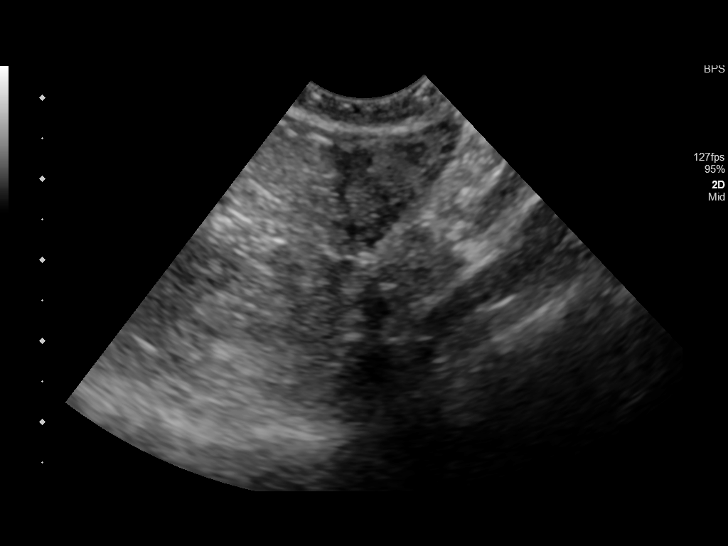

[13 of 25 positions shown; findings below may reference images not displayed]

FINDINGS: Uterus

Surgically absent

Endometrium

N/A

Right ovary

Measurements: 3.0 x 1.4 x 2.5 cm = volume: 5.6 mL. Small RIGHT
ovarian cyst 2.5 x 2.1 x 2.4 cm, simple features; this measured
x 1.9 x 1.8 cm on the prior CT. No additional mass.

Left ovary

Measurements: 3.7 x 1.8 x 2.2 cm = volume: 10.9 mL. Small cyst 1.9 x
1.7 x 2.2 cm, simple features; this measured 2.0 x 2.1 x 1.8 cm on
the prior CT. No additional mass.

Other findings

No free pelvic fluid or adnexal masses.
IMPRESSION: Small BILATERAL simple appearing ovarian cysts, both demonstrating
simple features by ultrasound and appearing grossly stable since
prior CT exam from 09/30/2016.

No additional pelvic sonographic abnormalities.

## 2020-07-16 MED ORDER — GABAPENTIN 600 MG PO TABS: ORAL_TABLET | ORAL | 1 refills | Status: DC

## 2020-07-20 ENCOUNTER — Other Ambulatory Visit: Payer: Self-pay

## 2020-07-20 ENCOUNTER — Encounter
Admission: RE | Admit: 2020-07-20 | Discharge: 2020-07-20 | Disposition: A | Discharge status: Home / Self Care | Payer: Medicare HMO | Source: Ambulatory Visit | Attending: Family | Admitting: Family

## 2020-07-20 DIAGNOSIS — R079 Chest pain, unspecified: Secondary | ICD-10-CM | POA: Insufficient documentation

## 2020-07-20 LAB — NM MYOCAR MULTI W/SPECT W/WALL MOTION / EF
Estimated workload: 1 METS
Exercise duration (min): 0 min
Exercise duration (sec): 0 s
LV dias vol: 76 mL (ref 46–106)
LV sys vol: 27 mL
MPHR: 154 {beats}/min
Peak HR: 92 {beats}/min
Percent HR: 59 %
Rest HR: 61 {beats}/min
SDS: 1
SRS: 4
SSS: 5
TID: 1.1

## 2020-07-20 MED ORDER — TECHNETIUM TC 99M TETROFOSMIN IV KIT
29.2700 | PACK | Freq: Once | INTRAVENOUS | Status: AC | PRN
  Administered 2020-07-20: 29.27 via INTRAVENOUS

## 2020-07-20 MED ORDER — REGADENOSON 0.4 MG/5ML IV SOLN
0.4000 mg | Freq: Once | INTRAVENOUS | Status: AC
  Administered 2020-07-20: 0.4 mg via INTRAVENOUS

## 2020-07-20 MED ORDER — TECHNETIUM TC 99M TETROFOSMIN IV KIT
10.0130 | PACK | Freq: Once | INTRAVENOUS | Status: AC | PRN
  Administered 2020-07-20: 10.013 via INTRAVENOUS

## 2020-07-24 ENCOUNTER — Telehealth: Payer: Self-pay | Admitting: Family Medicine

## 2020-07-24 NOTE — Telephone Encounter (Signed)
I called and spoke with the patient and she stated her son's disability is schizophrenia and she needs 2 bedrooms one for him and one for her, she would like no stairs because she stated she loses her balance on stairs and has to fell down them.  Jennelle Pinkstaff,cma

## 2020-07-24 NOTE — Telephone Encounter (Signed)
I received a fax from the [employer] for this patient.  It is requesting an accommodation for a disability that her son has.  Please follow-up and see what disability her son has.  Why do they need to move into a 2 bedroom apartment with no stairs?

## 2020-07-25 ENCOUNTER — Other Ambulatory Visit: Payer: Self-pay | Admitting: Family Medicine

## 2020-07-25 DIAGNOSIS — F250 Schizoaffective disorder, bipolar type: Secondary | ICD-10-CM

## 2020-07-27 ENCOUNTER — Other Ambulatory Visit: Payer: Self-pay | Admitting: Family Medicine

## 2020-07-27 ENCOUNTER — Other Ambulatory Visit: Payer: Self-pay | Admitting: Cardiovascular Disease

## 2020-07-27 ENCOUNTER — Other Ambulatory Visit: Payer: Self-pay | Admitting: Internal Medicine

## 2020-07-27 DIAGNOSIS — F250 Schizoaffective disorder, bipolar type: Secondary | ICD-10-CM

## 2020-07-27 DIAGNOSIS — E119 Type 2 diabetes mellitus without complications: Secondary | ICD-10-CM

## 2020-07-27 DIAGNOSIS — R110 Nausea: Secondary | ICD-10-CM

## 2020-07-27 DIAGNOSIS — Z760 Encounter for issue of repeat prescription: Secondary | ICD-10-CM

## 2020-07-27 DIAGNOSIS — E785 Hyperlipidemia, unspecified: Secondary | ICD-10-CM

## 2020-07-30 ENCOUNTER — Encounter: Payer: Self-pay | Admitting: Cardiovascular Disease

## 2020-07-30 ENCOUNTER — Telehealth (INDEPENDENT_AMBULATORY_CARE_PROVIDER_SITE_OTHER): Payer: Medicare HMO | Admitting: Cardiovascular Disease

## 2020-07-30 ENCOUNTER — Ambulatory Visit: Payer: Medicare HMO | Admitting: Family Medicine

## 2020-07-30 ENCOUNTER — Other Ambulatory Visit: Payer: Self-pay

## 2020-07-30 VITALS — BP 117/70 | Ht 66.0 in | Wt 221.0 lb

## 2020-07-30 DIAGNOSIS — I5032 Chronic diastolic (congestive) heart failure: Secondary | ICD-10-CM | POA: Diagnosis not present

## 2020-07-30 DIAGNOSIS — I201 Angina pectoris with documented spasm: Secondary | ICD-10-CM

## 2020-07-30 NOTE — Progress Notes (Signed)
Virtual Visit via Telephone Note   This visit type was conducted due to national recommendations for restrictions regarding the COVID-19 Pandemic (e.g. social distancing) in an effort to limit this patient's exposure and mitigate transmission in our community.  Due to her co-morbid illnesses, this patient is at least at moderate risk for complications without adequate follow up.  This format is felt to be most appropriate for this patient at this time.  The patient did not have access to video technology/had technical difficulties with video requiring transitioning to audio format only (telephone).  All issues noted in this document were discussed and addressed.  No physical exam could be performed with this format.  Please refer to the patient's chart for her  consent to telehealth for [facility].   Date:  07/30/2020   ID:  Valerie Vazquez, DOB Jan 21, 1956, MRN [id number]  Patient Location: Home Provider Location: Office/Clinic  PCP:  [name], MD  Cardiologist:  Lauree Chandler, MD  Electrophysiologist:  None   Evaluation Performed:  Follow-Up Visit  Chief Complaint:  Follow up- Chest pain  History of Present Illness:    Valerie Vazquez is a 64 y.o. 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 She was admitted to [facility] in April 2016 with chest pain. Her initial EKG was normal but she had dynamic ST changes when chest pain recurred so a code STEMI was called. Emergent cardiac cath with no evidence of CAD. Troponin was negative. She was started on Diltiazem and Imdur for possible vasospasm. Admitted to [facility] in January 2018 with chest pain. Nuclear stress test January 2018 with no ischemia. She was seen by primary care August 21, 2017 with chest pain. Troponin and d-dimer were both negative. Chest x-ray  demonstrated trace bilateral effusions. She was seen in the emergency room at [facility] 08/26/17 with chest pain. Troponin was negative. Chest x-ray demonstrated trace bilateral pleural effusions. Normal nuclear stress test in January 2019. Echo July 2020 with LVEF=60-65%. Recently seen in our [place] office with chest pain 06/06/20. She had been seen in primary care 06/04/20 and had c/o chest pain. D-dimer elevated. She had not missed any doses of Eliquis. CTA chest without PE. Her chest pain was felt to be musculoskeletal. Nuclear stress test 07/20/20 with no ischemia.   The patient denies chest pain, dyspnea, palpitations, dizziness, near syncope or syncope. No lower extremity edema.   The patient does not have symptoms concerning for COVID-19 infection (fever, chills, cough, or new shortness of breath).    Past Medical History:  Diagnosis Date  . Acute right-sided low back pain with right-sided sciatica 10/23/2017  . Anemia   . Bilateral renal cysts   . CAD (coronary artery disease) 09/28/2016  . Chest pain 10/14/2015  . Chronic back pain    "upper and lower" (09/19/2014)  . Chronic shoulder pain (Location of Primary Source of Pain) (Left) 01/29/2016  . Colitis 04/08/2016  . Coronary vasospasm ([place]) 12/09/2014  . Depression    "major" (09/19/2014)  . DVT (deep venous thrombosis) ([place])   . Esophageal spasm   . Essential hypertension 10/28/2013  . Gallstones   . Gastritis   . Gastroenteritis   . GERD (gastroesophageal reflux disease)   . Headache    "couple /month lately" (09/19/2014)  . Heart disease 12/09/2016  .  High blood pressure   . High cholesterol   . History of blood transfusion 1985   related to hysterectomy  . History of nuclear stress test    Myoview 1/19:  EF 65, no ischemia or scar  . History of pulmonary embolus (PE) 09/19/2014  . Hx of cervical spine surgery 01/29/2016  . 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 Rule out Paget's disease of the bone with  blood work.   . Hyperlipidemia 10/28/2013  . Hypertension   . IBS (irritable bowel syndrome)   . Lumbar central spinal stenosis (moderate at L4-5; mild at L2-3 and L3-4) 01/29/2016  . Migraine    "a few times/yr" (09/19/2014)  . Myocardial infarction ([place]) 10/2010 X 3   "while hospitalized"  . Neck pain 01/29/2016  . Osteoarthritis    "qwhere; mostly around my joints" (09/19/2014)  . Pleural effusion, bilateral 07/31/2015  . Pneumonia 04/2014  . PTSD (post-traumatic stress disorder)   . Pulmonary embolism ([place]) 04/2001; 09/19/2014   "after gallbladder OR; "  . Schizoaffective disorder ([place])   . Schizoaffective disorder, depressive type ([place]) 08/28/2016  . Sleep apnea    "mild" (09/19/2014)  . Snoring    a. sleep study 5/16: No OSA  . SOB (shortness of breath) 07/31/2015  . Spinal stenosis   . Spondylosis   . Suicide attempt ([place]) 08/27/2016  . Type II diabetes mellitus ([place])    "dx'd in 2007; lost weight; no RX for ~ 4 yr now" (09/19/2014)  . Wheezing 02/25/2017   Past Surgical History:  Procedure Laterality Date  . ABDOMINAL HYSTERECTOMY  1985  . ANTERIOR CERVICAL DECOMP/DISCECTOMY FUSION  10/2012   in [place] C5-C7   . APPENDECTOMY  1985  . BACK SURGERY    . BREAST CYST EXCISION Right   . BREAST EXCISIONAL BIOPSY Right YRS AGO   NEG  . CARDIAC CATHETERIZATION   2000's; 2009  . CHOLECYSTECTOMY  2002  . COLONOSCOPY WITH PROPOFOL N/A 12/12/2016   Procedure: COLONOSCOPY WITH PROPOFOL;  Surgeon: [name], MD;  Location: [facility] ENDOSCOPY;  Service: Endoscopy;  Laterality: N/A;  . COLONOSCOPY WITH PROPOFOL N/A 02/17/2017   Procedure: COLONOSCOPY WITH PROPOFOL;  Surgeon: [name], MD;  Location: [facility] ENDOSCOPY;  Service: Gastroenterology;  Laterality: N/A;  . ESOPHAGOGASTRODUODENOSCOPY (EGD) WITH PROPOFOL N/A 02/17/2017   Procedure: ESOPHAGOGASTRODUODENOSCOPY (EGD) WITH PROPOFOL;  Surgeon: [name], MD;  Location: [facility] ENDOSCOPY;  Service: Gastroenterology;  Laterality: N/A;  .  ESOPHAGOGASTRODUODENOSCOPY (EGD) WITH PROPOFOL N/A 03/07/2019   Procedure: ESOPHAGOGASTRODUODENOSCOPY (EGD) WITH PROPOFOL;  Surgeon: [name], MD;  Location: [facility] ENDOSCOPY;  Service: Gastroenterology;  Laterality: N/A;  . EXCISION/RELEASE BURSA HIP Right 12/1984  . HERNIA REPAIR  1985  . LEFT HEART CATHETERIZATION WITH CORONARY ANGIOGRAM N/A 12/09/2014   Procedure: LEFT HEART CATHETERIZATION WITH CORONARY ANGIOGRAM;  Surgeon: Burnell Blanks, MD;  Location: [facility] CATH LAB;  Service: Cardiovascular;  Laterality: N/A;  . TONSILLECTOMY AND ADENOIDECTOMY  ~ 1968  . TOTAL HIP ARTHROPLASTY Left 04-06-2014  . UMBILICAL HERNIA REPAIR  1985  . VENA CAVA FILTER PLACEMENT  03/2014     Current Meds  Medication Sig  . acetaminophen (TYLENOL) 500 MG tablet Take 1 tablet (500 mg total) by mouth every 6 (six) hours as needed.  . baclofen (LIORESAL) 10 MG tablet TAKE 1 TABLET BY MOUTH THREE TIMES A DAY AS NEEDED FOR MUSCLE SPASMS  . carvedilol (COREG) 3.125 MG tablet TAKE 1 TABLET (3.125 MG TOTAL) BY MOUTH 2 (TWO) TIMES DAILY WITH  A MEAL.  . cetirizine (ZYRTEC) 10 MG tablet TAKE 1 TABLET BY MOUTH EVERY DAY  . dicyclomine (BENTYL) 20 MG tablet TAKE 1 TABLET BY MOUTH THREE TIMES A DAY AS NEEDED FOR MUSCLE SPASMS  . diphenhydrAMINE (BENADRYL) 25 MG tablet Take 1 tablet (25 mg total) by mouth every 6 (six) hours.  Marland Kitchen ELIQUIS 5 MG TABS tablet TAKE 1 TABLET BY MOUTH TWICE A DAY  . escitalopram (LEXAPRO) 20 MG tablet TAKE 1 TABLET BY MOUTH EVERY DAY  . famotidine (PEPCID) 20 MG tablet TAKE 1 TABLET BY MOUTH EVERYDAY AT BEDTIME.  . furosemide (LASIX) 20 MG tablet TAKE 1 TABLET (20 MG TOTAL) BY MOUTH DAILY AS NEEDED FOR EDEMA.  Marland Kitchen gabapentin (NEURONTIN) 600 MG tablet TAKE 1 TABLET IN THE AM AND 1.5 TABLETS AT BEDTIME  . glucose blood test strip Check once daily, dispense per patient and insurance preference, dx code E11.9  . isosorbide mononitrate (IMDUR) 30 MG 24 hr tablet Take 30 mg by mouth daily.  Marland Kitchen JANUVIA 100  MG tablet TAKE 1 TABLET BY MOUTH EVERY DAY  . JARDIANCE 25 MG TABS tablet TAKE 1 TABLET BY MOUTH EVERY DAY  . KLOR-CON M20 20 MEQ tablet TAKE 1 TABLET (20 MEQ TOTAL) BY MOUTH DAILY AS NEEDED (TAKE WITH LASIX).  Marland Kitchen levothyroxine (SYNTHROID) 25 MCG tablet TAKE 1 TABLET BY MOUTH BEFORE BREAKFAST 2-3 HOURS BEFORE TAKING ANY OTHER MEDICATION OR SUPPLEMENT  . lidocaine (LIDODERM) 5 % Place 1 patch onto the skin daily. Remove & Discard patch within 12 hours.  . Multiple Vitamins-Minerals (SUPER THERA VITE M) TABS Take by mouth.  . nitroGLYCERIN (NITROSTAT) 0.4 MG SL tablet Place 1 tablet (0.4 mg total) under the tongue every 5 (five) minutes as needed for chest pain.  Marland Kitchen OLANZapine (ZYPREXA) 10 MG tablet TAKE 1 TABLET BY MOUTH EVERYDAY AT BEDTIME  . ondansetron (ZOFRAN) 4 MG tablet TAKE 1 TABLET (4 MG TOTAL) BY MOUTH 2 (TWO) TIMES DAILY AS NEEDED FOR NAUSEA OR VOMITING.  . pantoprazole (PROTONIX) 40 MG tablet TAKE 1 TABLET BY MOUTH EVERY DAY  . PRALUENT 75 MG/ML SOAJ every 14 (fourteen) days.  . rosuvastatin (CRESTOR) 40 MG tablet Take 1 tablet (40 mg total) by mouth daily.     Allergies:   Abilify [aripiprazole], Augmentin [amoxicillin-pot clavulanate], Bactrim [sulfamethoxazole-trimethoprim], Ceftin [cefuroxime axetil], Coconut oil, Diclofenac, Dye fdc red [red dye], Indocin [indomethacin], Linaclotide, Lisinopril, Morphine, Morphine and related, Simvastatin, Sulfa antibiotics, Sulfamethoxazole-trimethoprim, Wellbutrin [bupropion], Zetia [ezetimibe], Quetiapine fumarate, and Quetiapine fumarate   Social History   Tobacco Use  . Smoking status: Former Smoker    Packs/day: 1.50    Years: 10.00    Pack years: 15.00    Types: Cigarettes    Quit date: 04/12/1979    Years since quitting: 41.3  . Smokeless tobacco: Never Used  Vaping Use  . Vaping Use: Never used  Substance Use Topics  . Alcohol use: No    Alcohol/week: 0.0 standard drinks  . Drug use: No     Family Hx: The patient's family  history includes Cancer in her brother; Healthy in her daughter and son; Lung cancer in her father; Other in her son; Stroke in her mother. There is no history of Heart attack.  ROS:   Please see the history of present illness.    All other systems reviewed and are negative.   Prior CV studies:   The following studies were reviewed today:    Labs/Other Tests and Data Reviewed:    EKG:  No ECG reviewed.  Recent Labs: 05/03/2020: ALT 21 06/01/2020: BUN 7; Creatinine, Ser 0.93; Hemoglobin 13.3; Platelets 336; Potassium 3.8; Sodium 141   Recent Lipid Panel Lab Results  Component Value Date/Time   CHOL 169 03/12/2020 09:35 AM   TRIG 96.0 03/12/2020 09:35 AM   HDL 40.00 03/12/2020 09:35 AM   CHOLHDL 4 03/12/2020 09:35 AM   LDLCALC 110 (H) 03/12/2020 09:35 AM   LDLDIRECT 111.0 05/03/2020 08:00 AM    Wt Readings from Last 3 Encounters:  07/30/20 221 lb (100.2 kg)  06/20/20 229 lb 9.6 oz (104.1 kg)  06/06/20 229 lb 4 oz (104 kg)     Objective:    Vital Signs:  BP 117/70   Ht 5\' 6"  (1.676 m)   Wt 221 lb (100.2 kg)   BMI 35.67 kg/m    Vital signs reviewed.   ASSESSMENT & PLAN:     1. Coronary vasospasm: She has rare atypical chest pains. No CAD by cath in April 2016. Possible coronary vasospasm. Normal stress test in January 2019 and again November 2021. LV systolic function normal by echo in July 2020 and by recent stress test. Continue beta blocker and Imdur.  2. HTN: BP is controlled. No changes today  3. HLD: Followed in primary care. Continue statin.   4. History of Pulmonary embolism: This is managed by primary care. Continue Eliquis.   5. Chronic diastolic CHF: Weight is stable per pt. Continue Lasix as needed.   COVID-19 Education: The signs and symptoms of COVID-19 were discussed with the patient and how to seek care for testing (follow up with PCP or arrange E-visit).  The importance of social distancing was discussed today.  Time:   Today, I have spent  20 minutes with the patient with telehealth technology discussing the above problems.     Medication Adjustments/Labs and Tests Ordered: Current medicines are reviewed at length with the patient today.  Concerns regarding medicines are outlined above.   Tests Ordered: No orders of the defined types were placed in this encounter.   Medication Changes: No orders of the defined types were placed in this encounter.  Disposition:  Follow up in 12 months with me.   Signed, Lauree Chandler, MD  07/30/2020 10:32 AM    [place]

## 2020-07-30 NOTE — Telephone Encounter (Signed)
I spoke with the patient earlier today regarding her request. She reports her son has schizophrenia and is on disability. He is in a work Tourist information centre manager. She notes he is a paranoid schizophrenic. She notes this makes it difficult for him to think appropriately at times. She is requesting a second bedroom due to his disability. She is unsure if he follows with a psychiatrist though he is in a rehab program. Form filled out based on this information.

## 2020-08-03 ENCOUNTER — Other Ambulatory Visit: Payer: Self-pay

## 2020-08-03 ENCOUNTER — Telehealth: Payer: Medicare HMO | Admitting: Family Medicine

## 2020-08-06 IMAGING — CT CT RENAL STONE PROTOCOL
2 of 4 series · 16 of 46 positions shown, 18 images · non-contrast
Comparison: 09/30/2016

CLINICAL DATA: Acute right flank pain for 3 days. Remote
cholecystectomy and hysterectomy

EXAM:
CT ABDOMEN AND PELVIS WITHOUT CONTRAST
TECHNIQUE: Multidetector CT imaging of the abdomen and pelvis was performed
following the standard protocol without IV contrast.

[Series 2: stone full standard · axial · 0.79mm/px · z∈[-972,-526]mm · 13 of 99 slices shown, 15 images]
[im 5/99  soft-tissue]
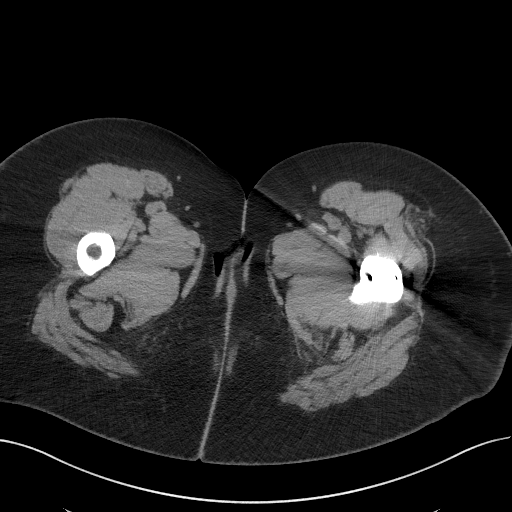
[im 5/99  bone]
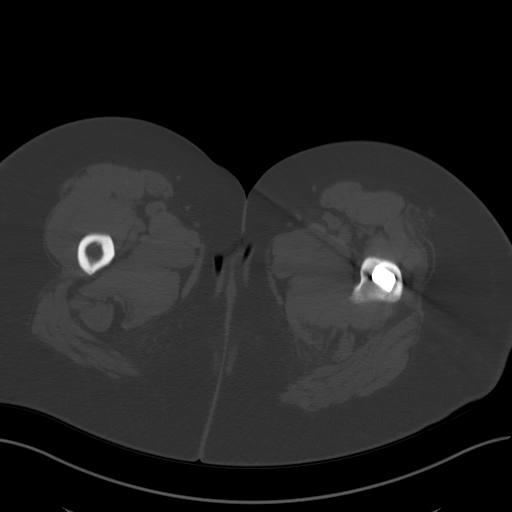
[im 13/99  soft-tissue]
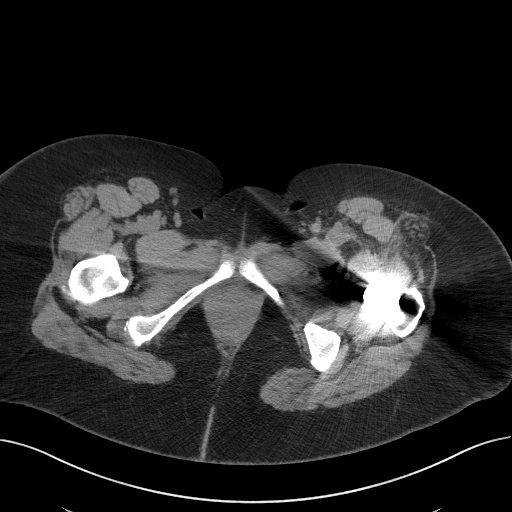
[im 21/99  soft-tissue]
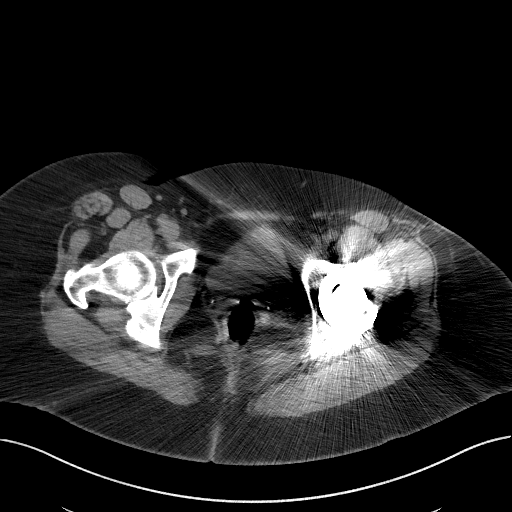
[im 29/99  soft-tissue]
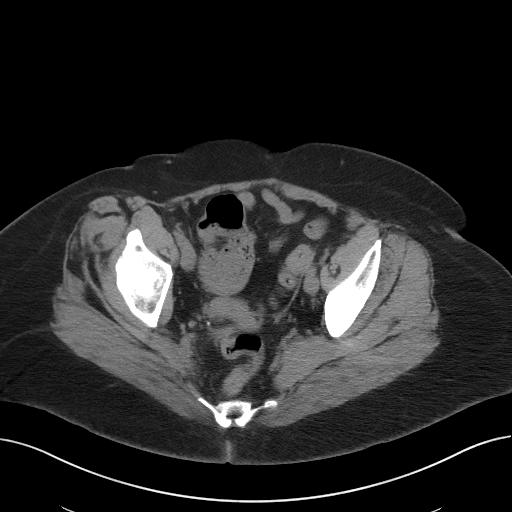
[im 33/99  soft-tissue]
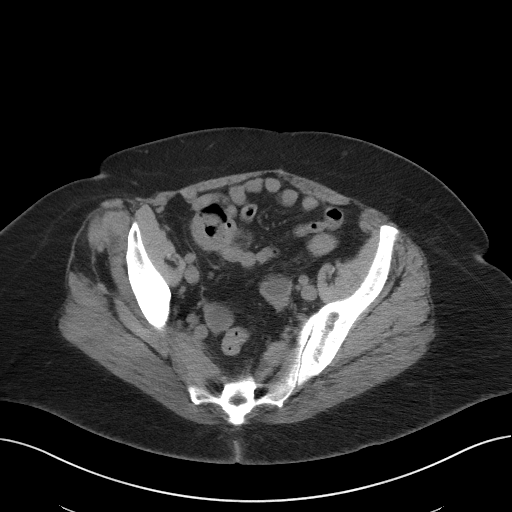
[im 41/99  soft-tissue]
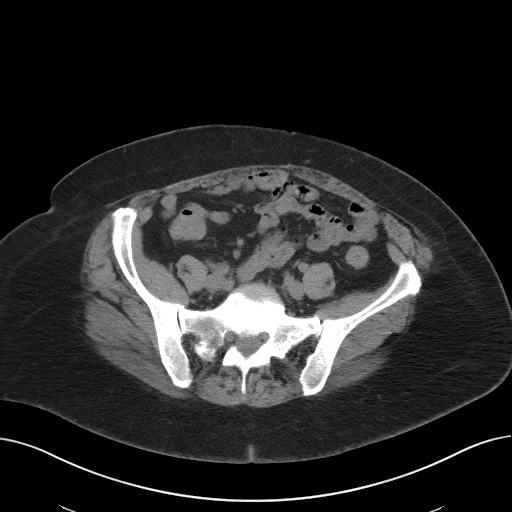
[im 50/99  soft-tissue]
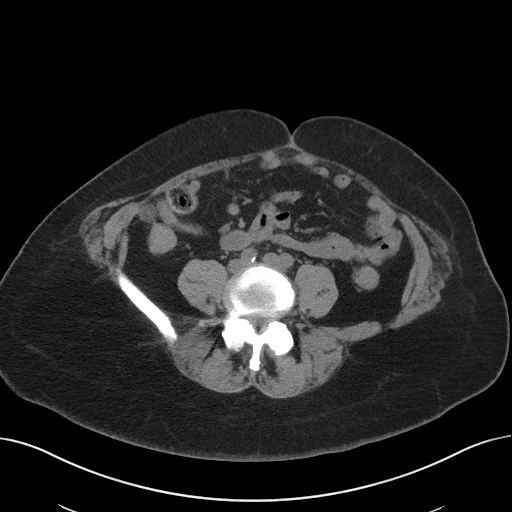
[im 58/99  soft-tissue]
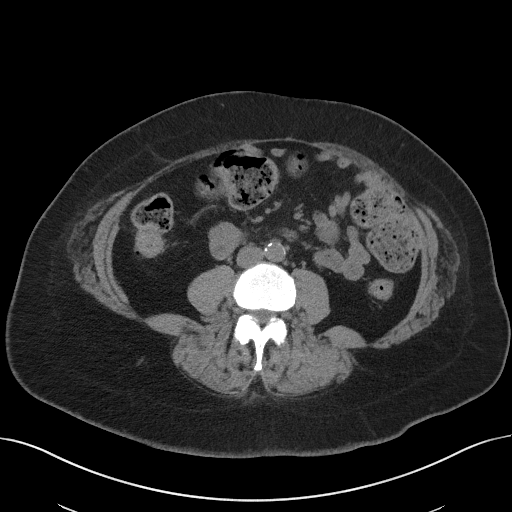
[im 66/99  soft-tissue]
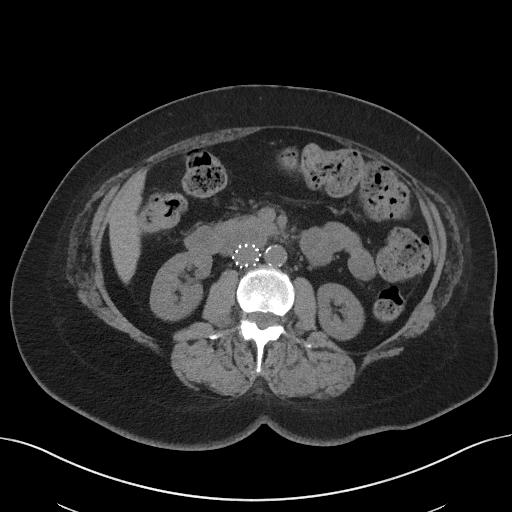
[im 66/99  bone]
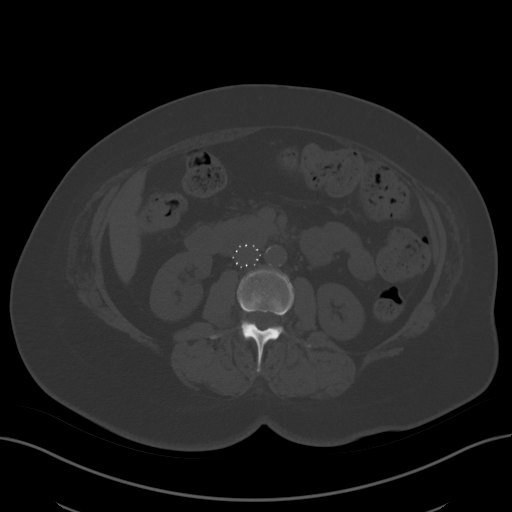
[im 70/99  soft-tissue]
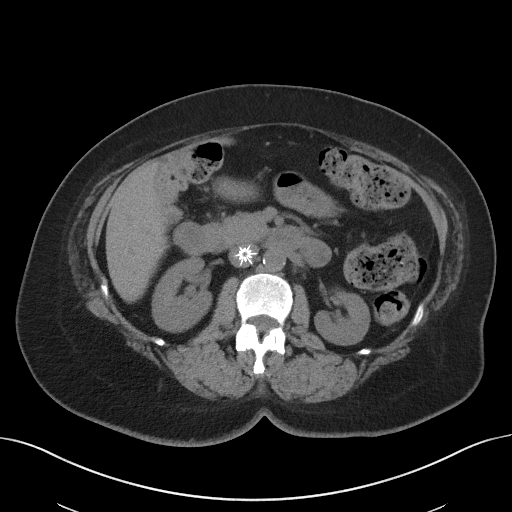
[im 78/99  soft-tissue]
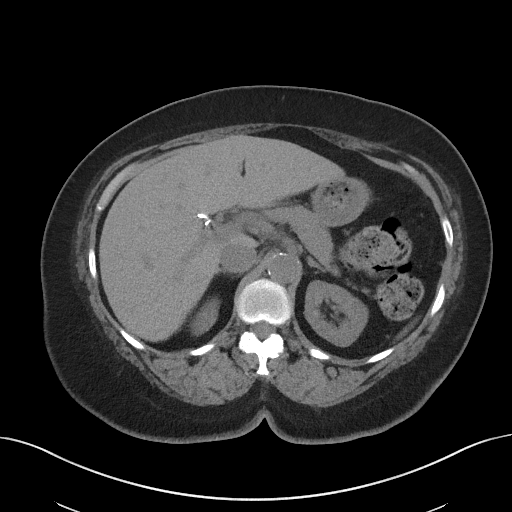
[im 86/99  soft-tissue]
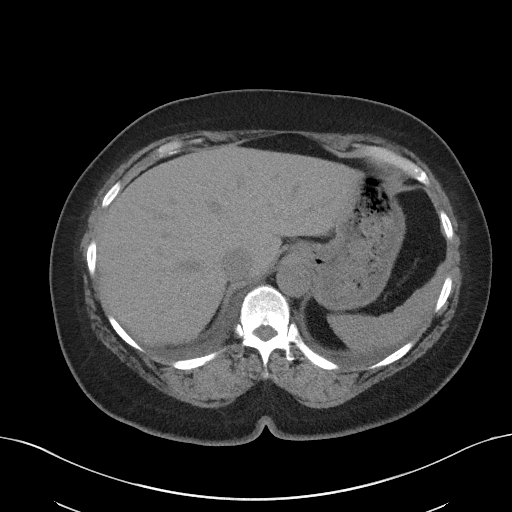
[im 94/99  soft-tissue]
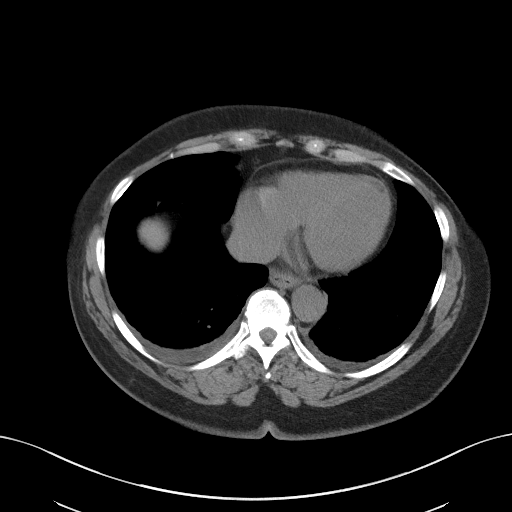

[Series 5: coronal · coronal · 0.86mm/px · 3 of 140 slices shown]
[im 47/140  soft-tissue]
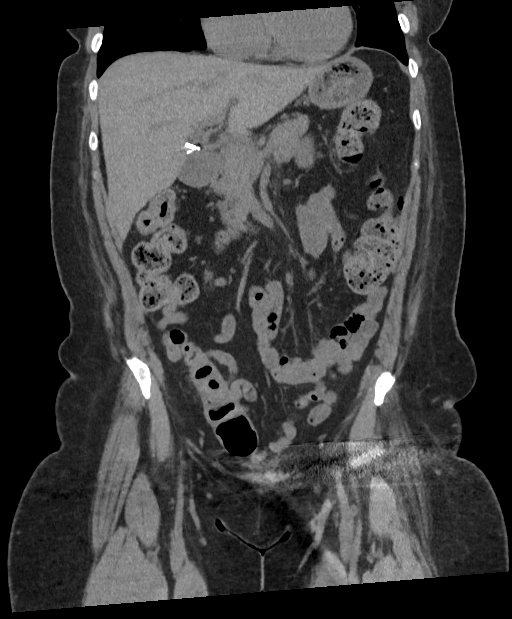
[im 62/140  soft-tissue]
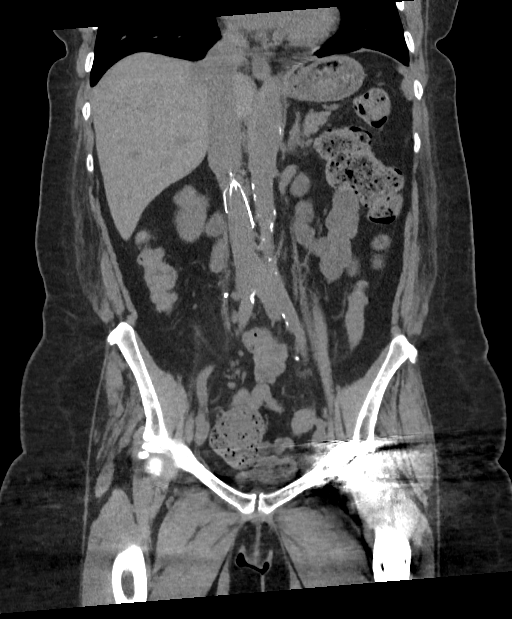
[im 78/140  soft-tissue]
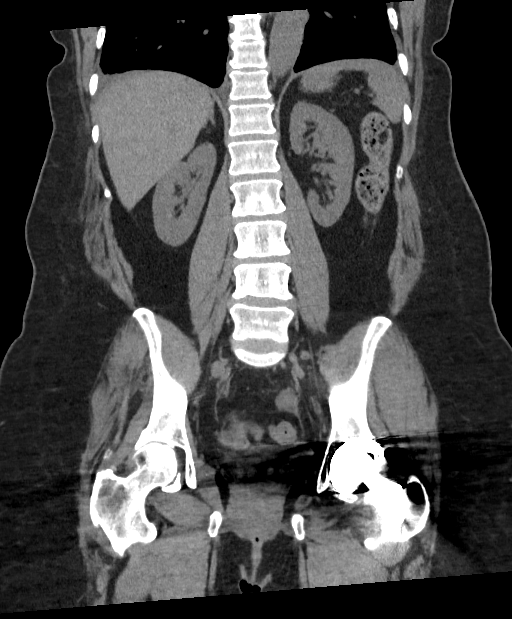

[16 of 46 positions shown; findings below may reference images not displayed]

FINDINGS: Lower chest: Trace pleural effusions and associated minor basilar
atelectasis. Normal heart size. No pericardial effusion.

Hepatobiliary: No focal liver abnormality is seen. Status post
cholecystectomy. No biliary dilatation.

Pancreas: Unremarkable. No pancreatic ductal dilatation or
surrounding inflammatory changes.

Spleen: Normal in size without focal abnormality.

Adrenals/Urinary Tract: Normal adrenal glands. No renal obstruction,
hydronephrosis, hydroureter or definite obstructing ureteral
calculus. Limited assessment the bladder because of artifact from
the left hip hardware.

Bilateral ovarian vein calcified phleboliths noted as before. These
calcifications are adjacent to the nondilated ureters bilaterally.

Stomach/Bowel: Negative for bowel obstruction, significant
dilatation, ileus, or free air. Appendix not visualized. No acute
inflammatory process in the right upper quadrant. No fluid
collection, abscess, hemorrhage, hematoma, or ascites. Moderate
colonic stool burden throughout the transverse colon.

Vascular/Lymphatic: Aorta atherosclerotic. Negative for aneurysm.
IVC filter in the infrarenal IVC. No bulky adenopathy.

Reproductive: Remote hysterectomy. Small bilateral adnexal likely
ovarian cysts as before measuring 2.4 cm on the right and 2.2 cm on
the left. No pelvic ascites.

Other: No abdominal wall hernia or abnormality. No abdominopelvic
ascites.

Musculoskeletal: Left hip replacement creates artifact through the
pelvis. Degenerative changes of the spine. Lower lumbar facet
arthropathy. No acute osseous finding.
IMPRESSION: No acute obstructing urinary tract or ureteral calculus. No acute
obstructive uropathy or hydronephrosis on either side.

Trace pleural effusions and minimal basilar atelectasis

Remote cholecystectomy and hysterectomy

Abdominal aortic atherosclerosis without aneurysm

Small bilateral ovarian cysts, unchanged compared to 09/30/2016.

## 2020-08-06 IMAGING — CR DG LUMBAR SPINE 2-3V
1 series · 3 of 3 positions shown · non-contrast
Comparison: 10/23/2017 MRI of the lumbar spine

CLINICAL DATA: Low back pain for 3 days, no known injury, initial
encounter

EXAM:
LUMBAR SPINE - 2-3 VIEW

[Series 1: dg lumbar spine 2-3 views · 0.14mm/px · 3 of 3 slices shown]
[im 1/3]
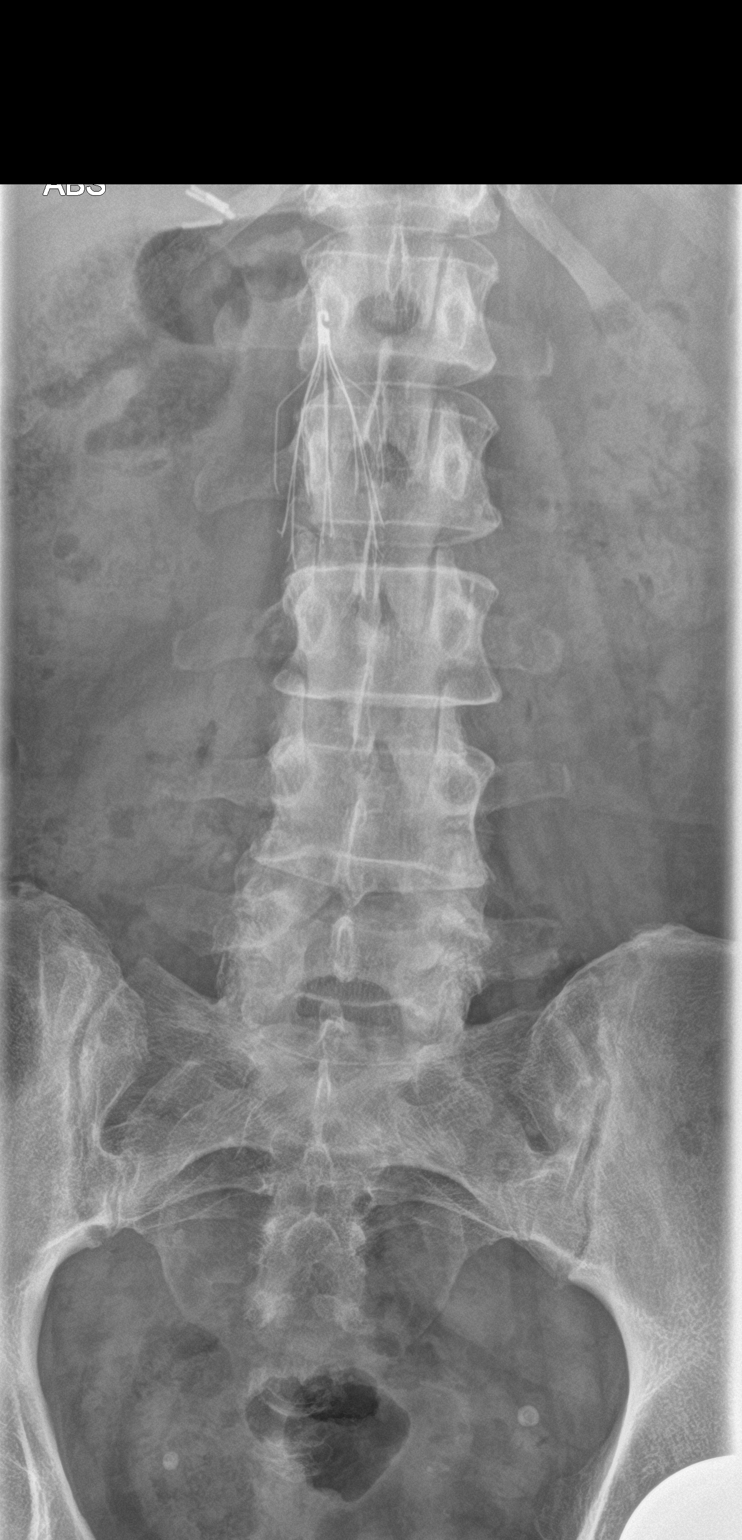
[im 2/3]
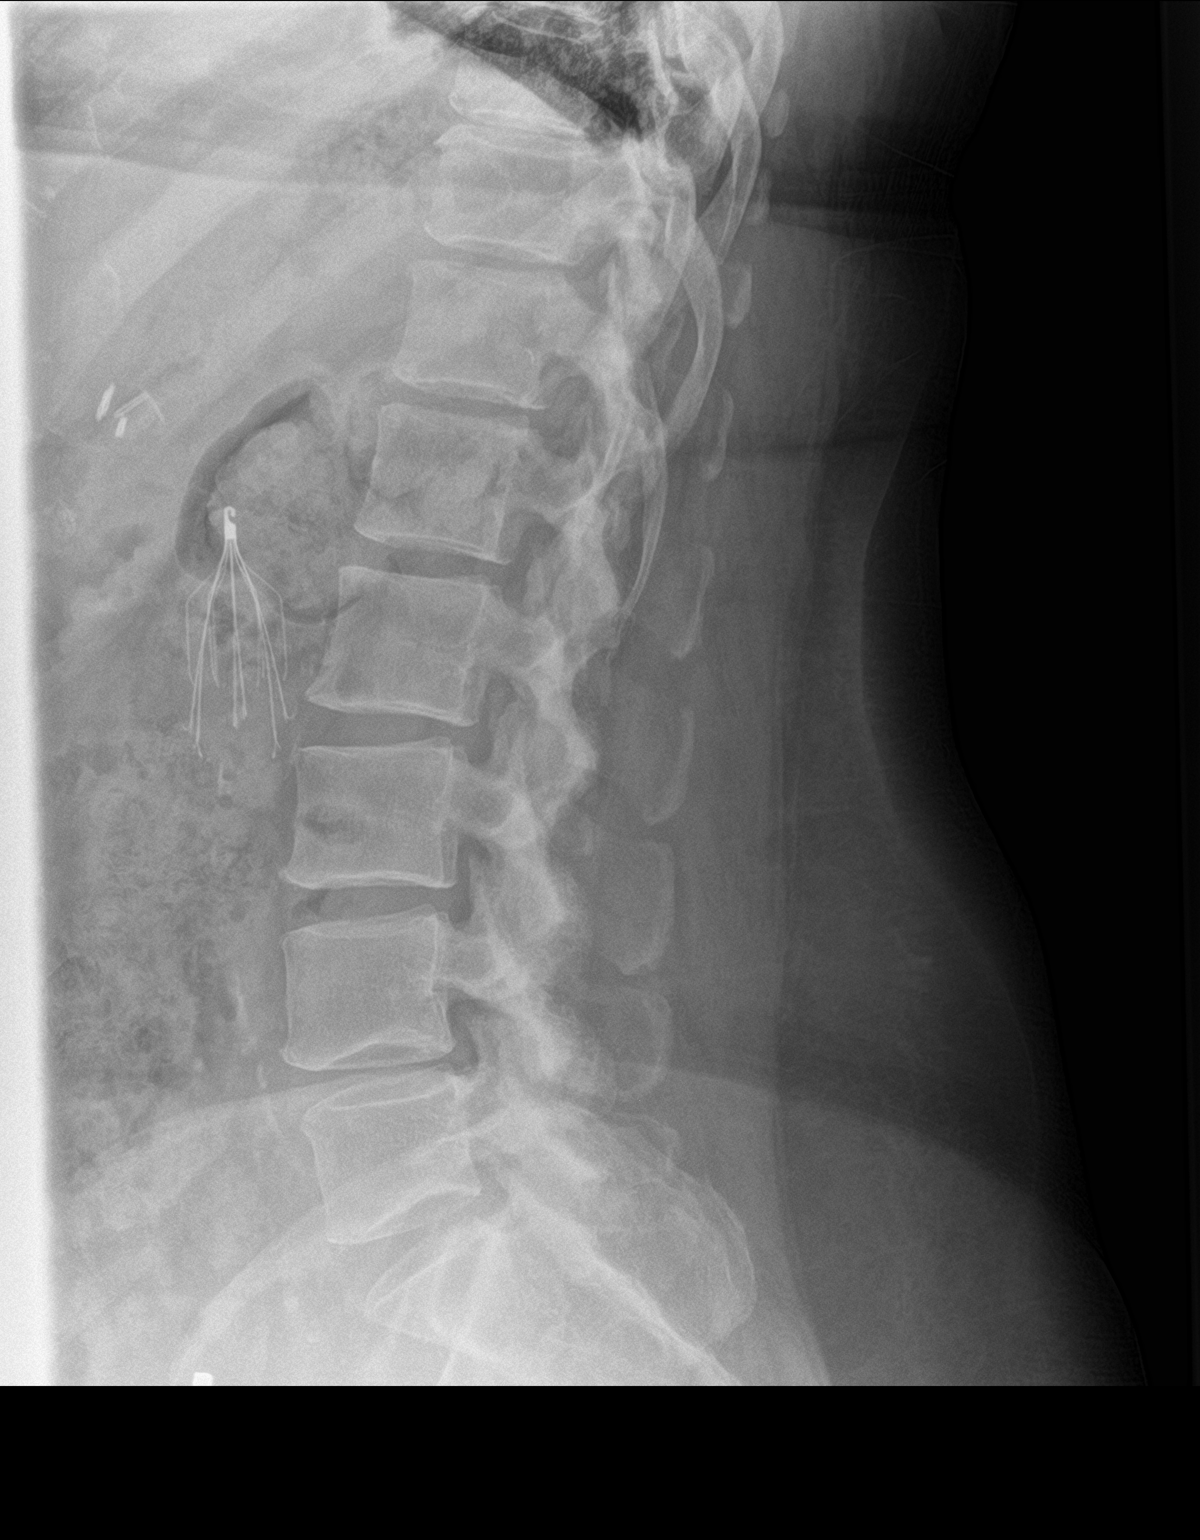
[im 3/3]
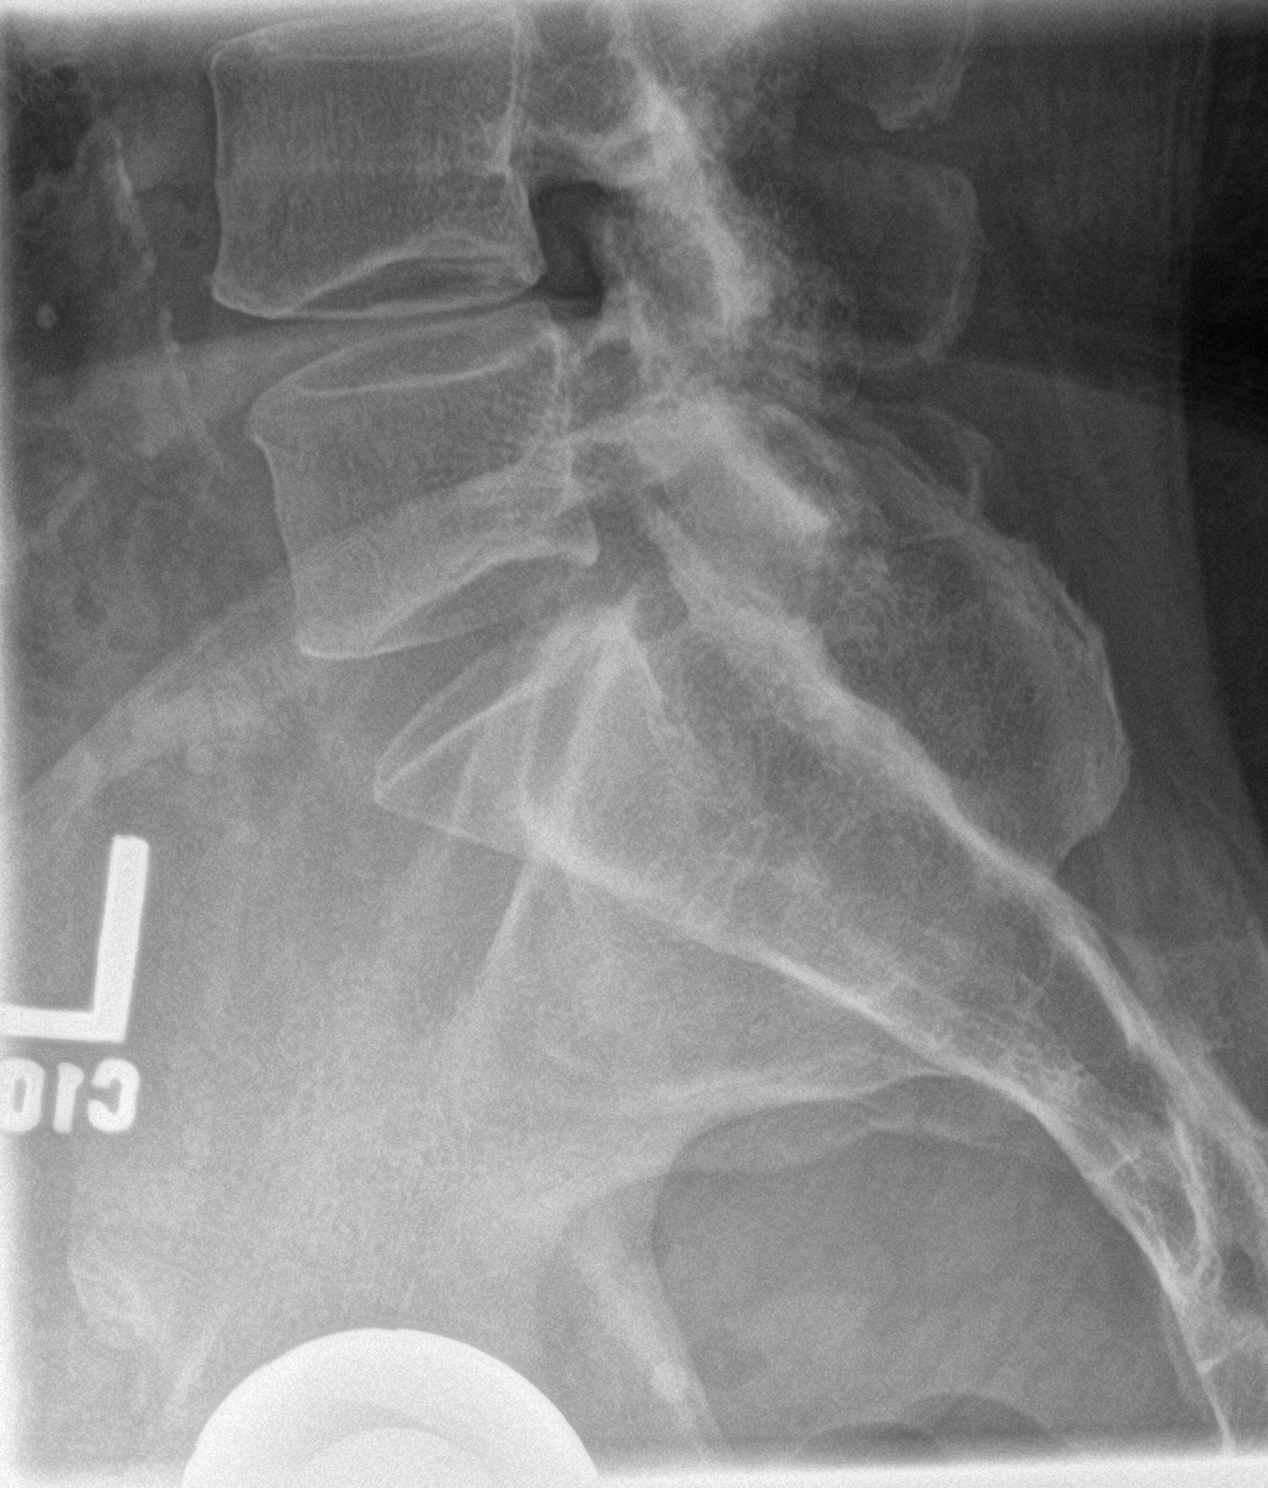

[3 of 3 positions shown; findings below may reference images not displayed]

FINDINGS: Five lumbar type vertebral bodies are well visualized. Vertebral
body height is well maintained. IVC filter is noted in place. Mild
facet hypertrophic changes are noted without significant
anterolisthesis. No compression deformity is noted. No soft tissue
abnormality is seen.
IMPRESSION: Degenerative change without acute abnormality.

## 2020-08-06 IMAGING — CR DG HIP (WITH OR WITHOUT PELVIS) 2-3V*R*
1 series · 3 of 3 positions shown · non-contrast
Comparison: 09/20/2014

CLINICAL DATA: Right hip pain for 3 days, no known injury, initial
encounter

EXAM:
DG HIP (WITH OR WITHOUT PELVIS) 2-3V RIGHT

[Series 1: dg hip unilat w or w/o pelvis 2-3 views  · non-contrast · 0.14mm/px · 3 of 3 slices shown]
[im 1/3]
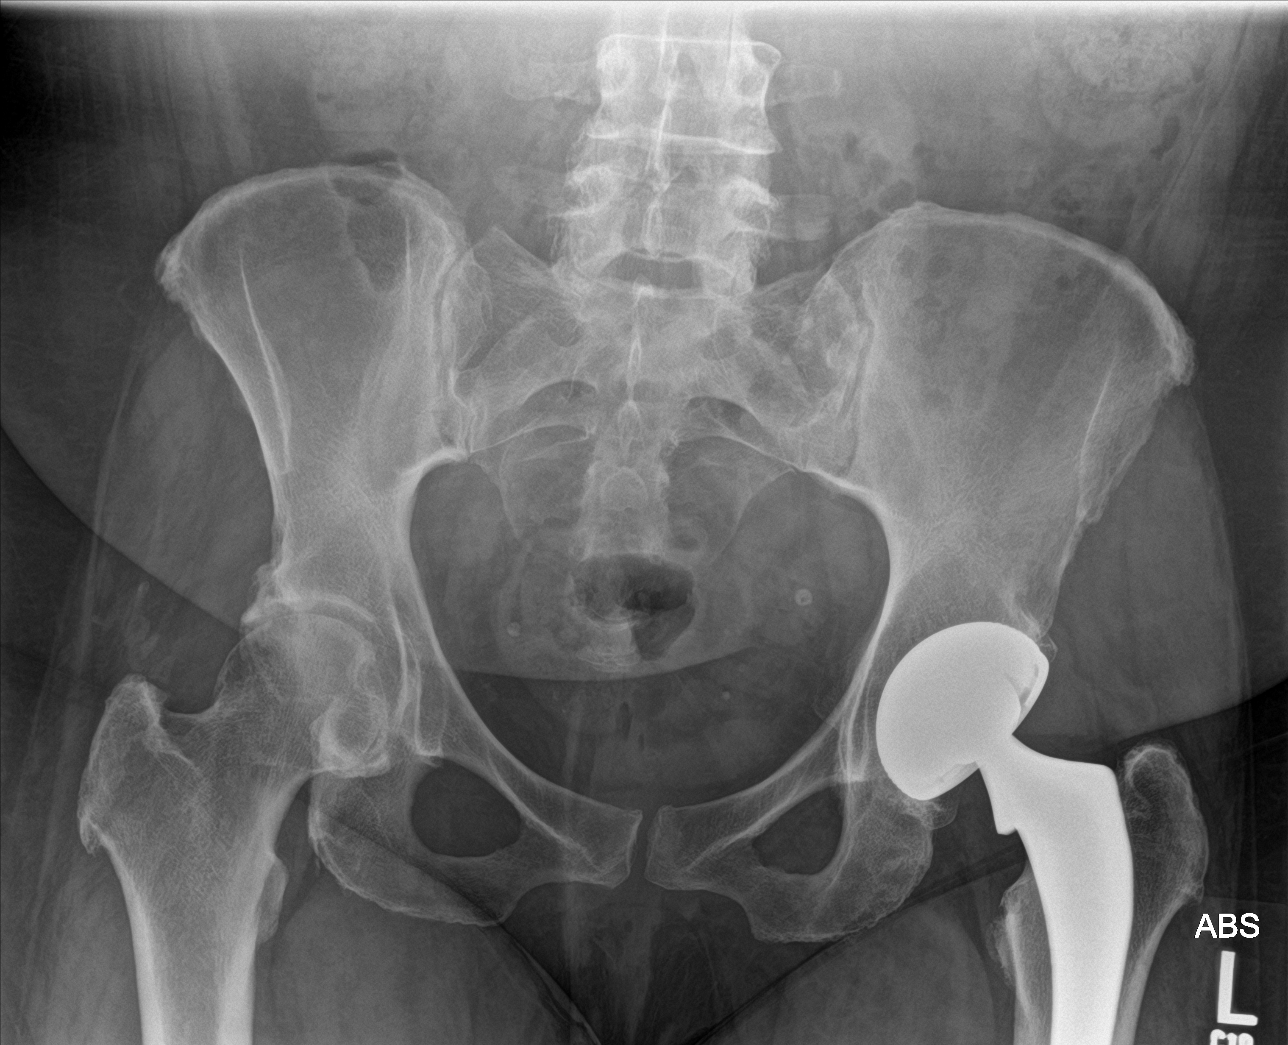
[im 2/3]
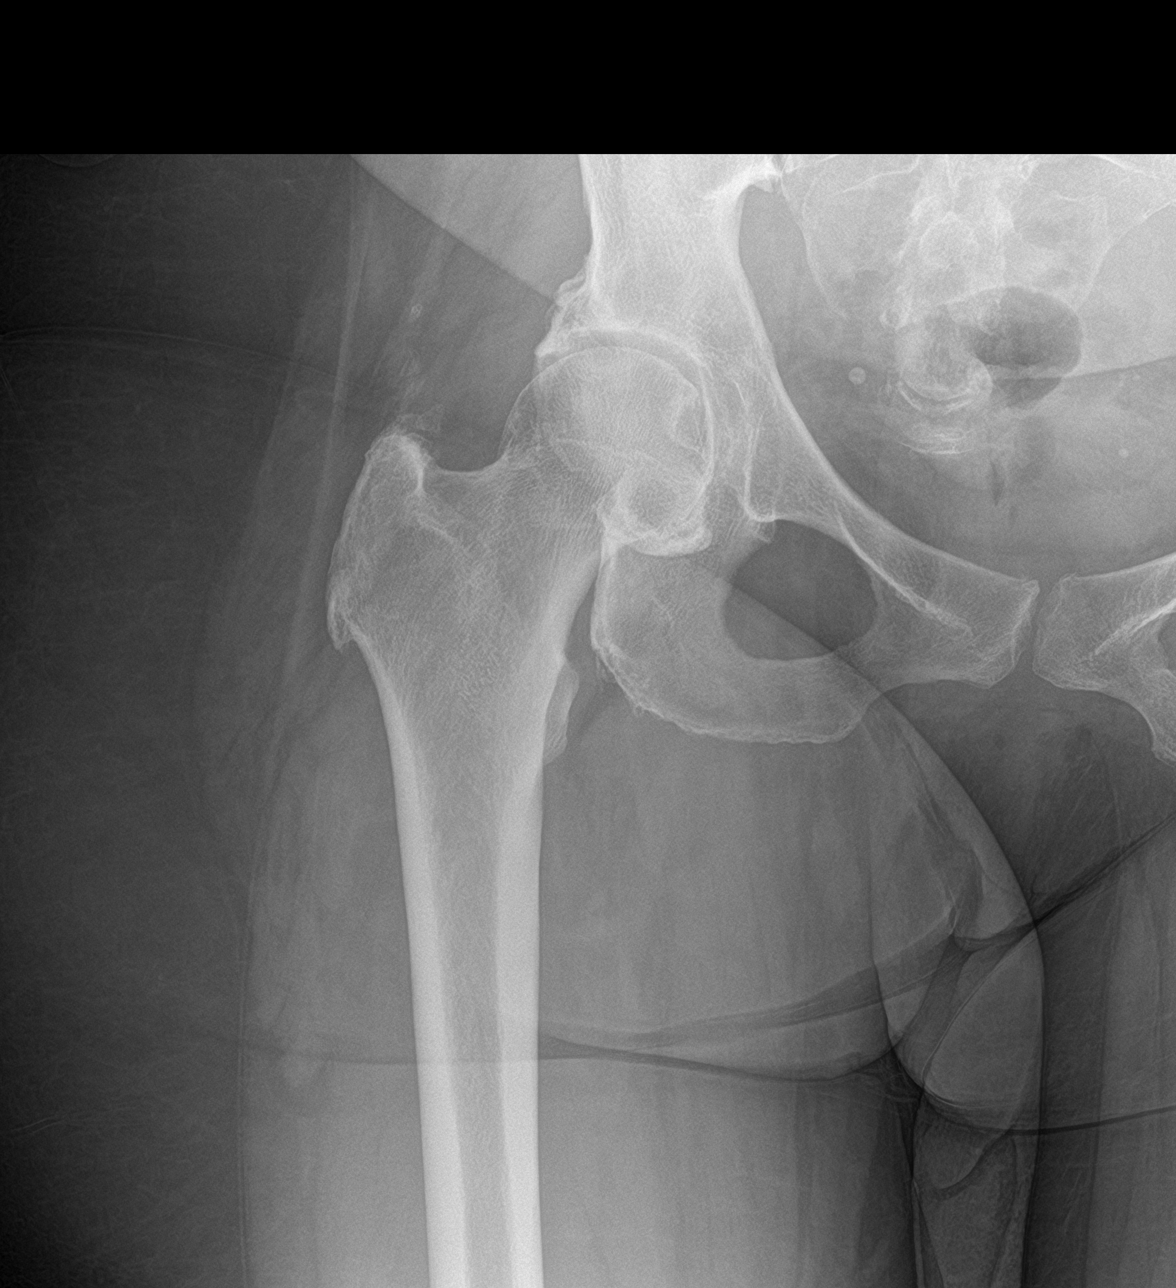
[im 3/3]
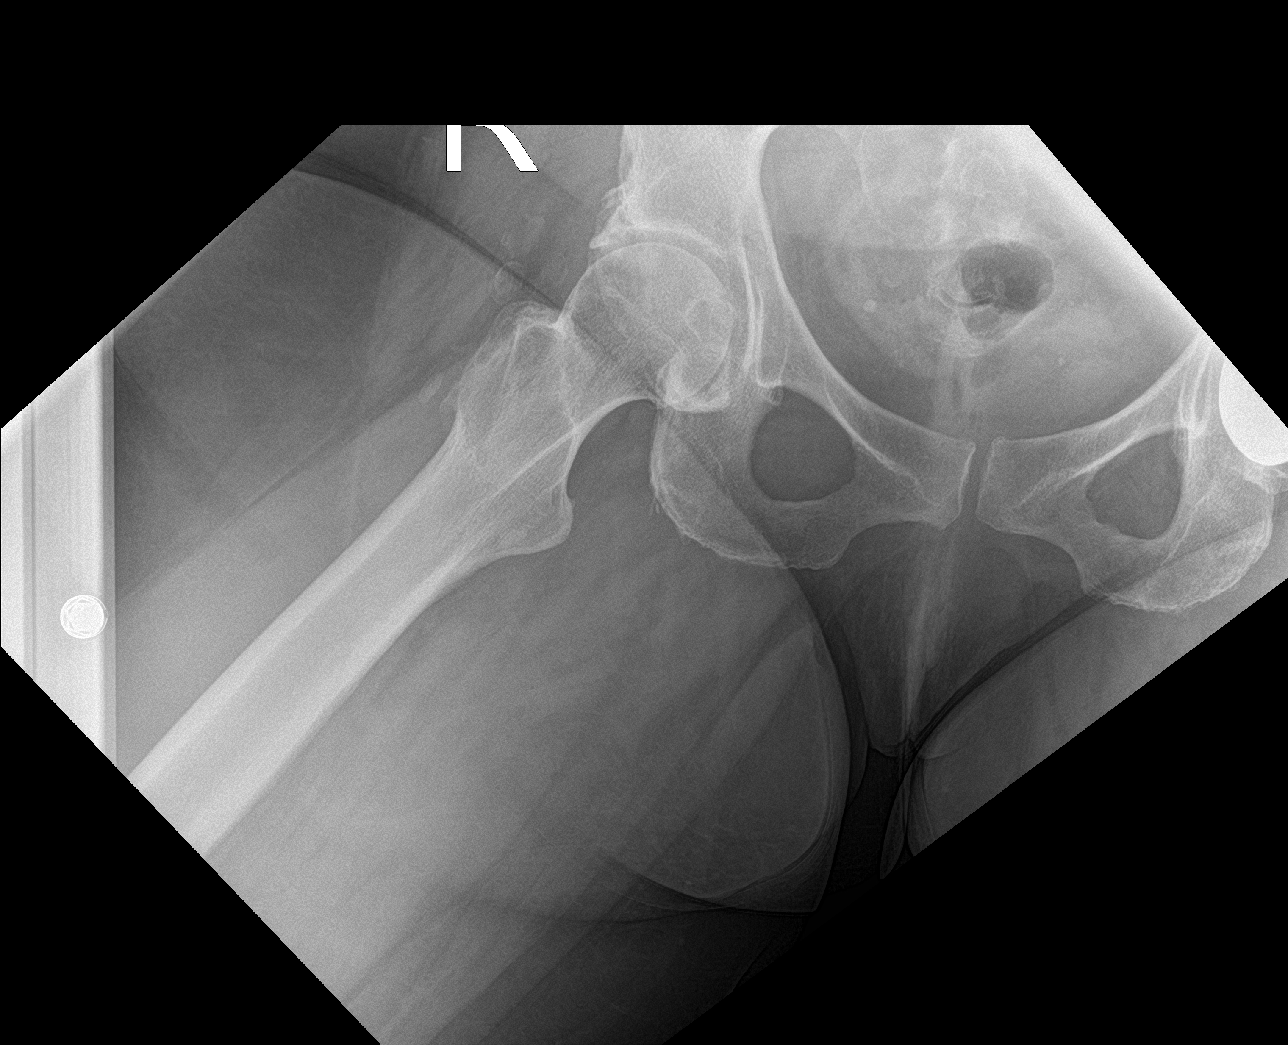

[3 of 3 positions shown; findings below may reference images not displayed]

FINDINGS: Left hip replacement is noted. The pelvic ring is intact.
Degenerative changes of the right hip joint are seen. No acute
fracture or dislocation is noted. No soft tissue abnormality is
noted.
IMPRESSION: Degenerative change of the right hip joint without acute
abnormality.

## 2020-08-10 ENCOUNTER — Emergency Department
Admission: EM | Admit: 2020-08-10 | Discharge: 2020-08-10 | Disposition: A | Discharge status: Home / Self Care | Payer: Medicare HMO | Attending: Emergency Medicine | Admitting: Emergency Medicine

## 2020-08-10 ENCOUNTER — Other Ambulatory Visit: Payer: Self-pay

## 2020-08-10 DIAGNOSIS — I2510 Atherosclerotic heart disease of native coronary artery without angina pectoris: Secondary | ICD-10-CM | POA: Insufficient documentation

## 2020-08-10 DIAGNOSIS — I5032 Chronic diastolic (congestive) heart failure: Secondary | ICD-10-CM | POA: Diagnosis not present

## 2020-08-10 DIAGNOSIS — H1033 Unspecified acute conjunctivitis, bilateral: Secondary | ICD-10-CM | POA: Insufficient documentation

## 2020-08-10 DIAGNOSIS — Z79899 Other long term (current) drug therapy: Secondary | ICD-10-CM | POA: Insufficient documentation

## 2020-08-10 DIAGNOSIS — Z951 Presence of aortocoronary bypass graft: Secondary | ICD-10-CM | POA: Insufficient documentation

## 2020-08-10 DIAGNOSIS — E039 Hypothyroidism, unspecified: Secondary | ICD-10-CM | POA: Diagnosis not present

## 2020-08-10 DIAGNOSIS — E119 Type 2 diabetes mellitus without complications: Secondary | ICD-10-CM | POA: Diagnosis not present

## 2020-08-10 DIAGNOSIS — I110 Hypertensive heart disease with heart failure: Secondary | ICD-10-CM | POA: Diagnosis not present

## 2020-08-10 DIAGNOSIS — B9689 Other specified bacterial agents as the cause of diseases classified elsewhere: Secondary | ICD-10-CM | POA: Diagnosis not present

## 2020-08-10 DIAGNOSIS — Z7901 Long term (current) use of anticoagulants: Secondary | ICD-10-CM | POA: Diagnosis not present

## 2020-08-10 DIAGNOSIS — K047 Periapical abscess without sinus: Secondary | ICD-10-CM | POA: Insufficient documentation

## 2020-08-10 DIAGNOSIS — Z87891 Personal history of nicotine dependence: Secondary | ICD-10-CM | POA: Diagnosis not present

## 2020-08-10 MED ORDER — CIPROFLOXACIN HCL 0.3 % OP SOLN
1.0000 [drp] | Freq: Once | OPHTHALMIC | Status: AC
  Administered 2020-08-10: 1 [drp] via OPHTHALMIC
  Filled 2020-08-10: qty 2.5

## 2020-08-10 MED ORDER — CLINDAMYCIN HCL 300 MG PO CAPS: 300.0000 mg | ORAL_CAPSULE | Freq: Four times a day (QID) | ORAL | 0 refills | Status: AC

## 2020-08-10 MED ORDER — CLINDAMYCIN HCL 150 MG PO CAPS
300.0000 mg | ORAL_CAPSULE | Freq: Once | ORAL | Status: AC
  Administered 2020-08-10: 300 mg via ORAL
  Filled 2020-08-10: qty 2

## 2020-08-10 MED ORDER — CIPROFLOXACIN HCL 0.3 % OP SOLN: 1.0000 [drp] | OPHTHALMIC | 0 refills | Status: DC

## 2020-08-10 NOTE — ED Provider Notes (Signed)
[facility] EMERGENCY DEPARTMENT Provider Note   CSN: [id number] Arrival date & time: 08/10/20  1701     History Chief Complaint  Patient presents with  . Abscess    Valerie Vazquez is a 64 y.o. female presents to the emergency department evaluation of bilateral eye redness, matting and drainage as well as an infection to the roof of her mouth.  She states she is had a dental abscess and has an infection along the roof of her mouth x1 week.  She is also had about 1 week progressive eye redness with some discharge and matting of the eyelids.  She denies any vision changes, trauma, injury.  No foreign body sensation.  Her eyes feel irritated.  She does not wear contacts.  She denies any fevers.  Her pain with abscess is very mild.  She is getting by with Tylenol.  HPI     Past Medical History:  Diagnosis Date  . Acute right-sided low back pain with right-sided sciatica 10/23/2017  . Anemia   . Bilateral renal cysts   . CAD (coronary artery disease) 09/28/2016  . Chest pain 10/14/2015  . Chronic back pain    "upper and lower" (09/19/2014)  . Chronic shoulder pain (Location of Primary Source of Pain) (Left) 01/29/2016  . Colitis 04/08/2016  . Coronary vasospasm ([place]) 12/09/2014  . Depression    "major" (09/19/2014)  . DVT (deep venous thrombosis) ([place])   . Esophageal spasm   . Essential hypertension 10/28/2013  . Gallstones   . Gastritis   . Gastroenteritis   . GERD (gastroesophageal reflux disease)   . Headache    "couple /month lately" (09/19/2014)  . Heart disease 12/09/2016  . High blood pressure   . High cholesterol   . History of blood transfusion 1985   related to hysterectomy  . History of nuclear stress test    Myoview 1/19:  EF 65, no ischemia or scar  . History of pulmonary embolus (PE) 09/19/2014  . Hx of cervical spine surgery 01/29/2016  . 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 Rule out Paget's disease of the bone with  blood work.   . Hyperlipidemia 10/28/2013  . Hypertension   . IBS (irritable bowel syndrome)   . Lumbar central spinal stenosis (moderate at L4-5; mild at L2-3 and L3-4) 01/29/2016  . Migraine    "a few times/yr" (09/19/2014)  . Myocardial infarction ([place]) 10/2010 X 3   "while hospitalized"  . Neck pain 01/29/2016  . Osteoarthritis    "qwhere; mostly around my joints" (09/19/2014)  . Pleural effusion, bilateral 07/31/2015  . Pneumonia 04/2014  . PTSD (post-traumatic stress disorder)   . Pulmonary embolism ([place]) 04/2001; 09/19/2014   "after gallbladder OR; "  . Schizoaffective disorder ([place])   . Schizoaffective disorder, depressive type ([place]) 08/28/2016  . Sleep apnea    "mild" (09/19/2014)  . Snoring    a. sleep study 5/16: No OSA  . SOB (shortness of breath) 07/31/2015  . Spinal stenosis   . Spondylosis   . Suicide attempt ([place]) 08/27/2016  . Type II diabetes mellitus ([place])    "dx'd in 2007; lost weight; no RX for ~ 4 yr now" (09/19/2014)  . Wheezing 02/25/2017    Patient Active Problem List   Diagnosis Date Noted  . Bilateral low back pain without sciatica 06/20/2020  . Surgery follow-up examination 04/12/2020  . Paronychia of great toe of right foot 04/05/2020  . Paronychia of great toe, right  02/27/2020  . Foraminal stenosis of cervical region 01/31/2020  . Cervical fusion syndrome (C5-C7 2014) 01/31/2020  . Ingrown toenail 01/27/2020  . Chronic diastolic heart failure ([place]) 01/27/2020  . Postmenopausal estrogen deficiency 08/12/2019  . Cysts of both ovaries 06/15/2019  . Left wrist pain 05/20/2019  . Allergic rhinitis 01/27/2019  . Right hip pain 10/06/2018  . Dental infection 03/21/2018  . Urinary incontinence 10/27/2017  . Elevated alkaline phosphatase level 10/27/2017  . Subclinical hypothyroidism 08/26/2017  . Diabetes mellitus without complication ([place]) 32/44/0102  . Colon polyp 01/30/2017  . Right upper quadrant pain 01/12/2017  . Plantar fasciitis of left foot  11/07/2016  . CAD (coronary artery disease) 09/28/2016  . Epigastric discomfort 09/16/2016  . Schizoaffective disorder, depressive type ([place]) 08/28/2016  . Colitis 04/08/2016  . Cervicalgia 01/29/2016  . Chronic pain syndrome 01/29/2016  . Osteoarthritis of hip (Bilateral) 01/29/2016  . Lumbar central spinal stenosis (moderate at L4-5; mild at L2-3 and L3-4) 01/29/2016  . Pleural effusion, bilateral 07/31/2015  . Esophageal spasm 06/05/2015  . History of DVT (deep vein thrombosis) 02/25/2015  . 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 Constipation 12/12/2014  . Coronary vasospasm ([place]) 12/09/2014  . History of pulmonary embolus (PE) 09/19/2014  . Anxiety and depression 04/13/2014  . IBS (irritable bowel syndrome) 04/11/2014  . GERD (gastroesophageal reflux disease) 04/11/2014  . Essential hypertension 10/28/2013  . Anemia 10/28/2013  . Hyperlipidemia 10/28/2013    Past Surgical History:  Procedure Laterality Date  . ABDOMINAL HYSTERECTOMY  1985  . ANTERIOR CERVICAL DECOMP/DISCECTOMY FUSION  10/2012   in [place] C5-C7   . APPENDECTOMY  1985  . BACK SURGERY    . BREAST CYST EXCISION Right   . BREAST EXCISIONAL BIOPSY Right YRS AGO   NEG  . CARDIAC CATHETERIZATION   2000's; 2009  . CHOLECYSTECTOMY  2002  . COLONOSCOPY WITH PROPOFOL N/A 12/12/2016   Procedure: COLONOSCOPY WITH PROPOFOL;  Surgeon: [name], MD;  Location: [facility] ENDOSCOPY;  Service: Endoscopy;  Laterality: N/A;  . COLONOSCOPY WITH PROPOFOL N/A 02/17/2017   Procedure: COLONOSCOPY WITH PROPOFOL;  Surgeon: [name], MD;  Location: [facility] ENDOSCOPY;  Service: Gastroenterology;  Laterality: N/A;  . ESOPHAGOGASTRODUODENOSCOPY (EGD) WITH PROPOFOL N/A 02/17/2017   Procedure: ESOPHAGOGASTRODUODENOSCOPY (EGD) WITH PROPOFOL;  Surgeon: [name], MD;  Location: [facility] ENDOSCOPY;  Service: Gastroenterology;  Laterality: N/A;  . ESOPHAGOGASTRODUODENOSCOPY (EGD) WITH PROPOFOL N/A 03/07/2019   Procedure: ESOPHAGOGASTRODUODENOSCOPY (EGD) WITH  PROPOFOL;  Surgeon: [name], MD;  Location: [facility] ENDOSCOPY;  Service: Gastroenterology;  Laterality: N/A;  . EXCISION/RELEASE BURSA HIP Right 12/1984  . HERNIA REPAIR  1985  . LEFT HEART CATHETERIZATION WITH CORONARY ANGIOGRAM N/A 12/09/2014   Procedure: LEFT HEART CATHETERIZATION WITH CORONARY ANGIOGRAM;  Surgeon: Burnell Blanks, MD;  Location: [facility] CATH LAB;  Service: Cardiovascular;  Laterality: N/A;  . TONSILLECTOMY AND ADENOIDECTOMY  ~ 1968  . TOTAL HIP ARTHROPLASTY Left 04-06-2014  . UMBILICAL HERNIA REPAIR  1985  . VENA CAVA FILTER PLACEMENT  03/2014     OB History    Gravida  6   Para  4   Term  4   Preterm      AB  2   Living  4     SAB  2   IAB      Ectopic      Multiple      Live Births  4           Family History  Problem Relation Age of Onset  . Stroke  Mother        Deceased  . Lung cancer Father        Deceased  . Healthy Daughter   . Healthy Son        x 2  . Other Son        Suicide  . Cancer Brother   . Heart attack Neg Hx     Social History   Tobacco Use  . Smoking status: Former Smoker    Packs/day: 1.50    Years: 10.00    Pack years: 15.00    Types: Cigarettes    Quit date: 04/12/1979    Years since quitting: 41.3  . Smokeless tobacco: Never Used  Vaping Use  . Vaping Use: Never used  Substance Use Topics  . Alcohol use: No    Alcohol/week: 0.0 standard drinks  . Drug use: No    Home Medications Prior to Admission medications   Medication Sig Start Date End Date Taking? Authorizing Provider  acetaminophen (TYLENOL) 500 MG tablet Take 1 tablet (500 mg total) by mouth every 6 (six) hours as needed. 09/29/18   Laban Emperor, PA-C  baclofen (LIORESAL) 10 MG tablet TAKE 1 TABLET BY MOUTH THREE TIMES A DAY AS NEEDED FOR MUSCLE SPASMS 07/12/20   Einar Pheasant, MD  carvedilol (COREG) 3.125 MG tablet TAKE 1 TABLET (3.125 MG TOTAL) BY MOUTH 2 (TWO) TIMES DAILY WITH A MEAL. 07/30/20   Burnell Blanks, MD   cetirizine (ZYRTEC) 10 MG tablet TAKE 1 TABLET BY MOUTH EVERY DAY 02/27/20   [name], MD  ciprofloxacin (CILOXAN) 0.3 % ophthalmic solution Place 1 drop into both eyes every 2 (two) hours. Administer 1 drop, every 2 hours, while awake, for 2 days. Then 1 drop, every 4 hours, while awake, for the next 5 days. 08/10/20   Duanne Jillson, PA-C  clindamycin (CLEOCIN) 300 MG capsule Take 1 capsule (300 mg total) by mouth 4 (four) times daily for 7 days. 08/10/20 08/17/20  Duanne Porr, PA-C  dicyclomine (BENTYL) 20 MG tablet TAKE 1 TABLET BY MOUTH THREE TIMES A DAY AS NEEDED FOR MUSCLE SPASMS 12/05/19   [name], MD  diphenhydrAMINE (BENADRYL) 25 MG tablet Take 1 tablet (25 mg total) by mouth every 6 (six) hours. 09/12/16   Jola Schmidt, MD  ELIQUIS 5 MG TABS tablet TAKE 1 TABLET BY MOUTH TWICE A DAY 03/28/20   [name], MD  escitalopram (LEXAPRO) 20 MG tablet TAKE 1 TABLET BY MOUTH EVERY DAY 07/25/20   [name], MD  famotidine (PEPCID) 20 MG tablet TAKE 1 TABLET BY MOUTH EVERYDAY AT BEDTIME. 05/03/20   [name], MD  furosemide (LASIX) 20 MG tablet TAKE 1 TABLET (20 MG TOTAL) BY MOUTH DAILY AS NEEDED FOR EDEMA. 04/09/20   Einar Pheasant, MD  gabapentin (NEURONTIN) 600 MG tablet TAKE 1 TABLET IN THE AM AND 1.5 TABLETS AT BEDTIME 07/16/20   [name], MD  glucose blood test strip Check once daily, dispense per patient and insurance preference, dx code E11.9 12/20/19   [name], MD  isosorbide mononitrate (IMDUR) 30 MG 24 hr tablet Take 30 mg by mouth daily.    [provider]  JANUVIA 100 MG tablet TAKE 1 TABLET BY MOUTH EVERY DAY 07/30/20   [name], MD  JARDIANCE 25 MG TABS tablet TAKE 1 TABLET BY MOUTH EVERY DAY 07/04/20   [name], MD  KLOR-CON M20 20 MEQ tablet TAKE 1  TABLET (20 MEQ TOTAL) BY MOUTH DAILY AS NEEDED (TAKE WITH LASIX). 04/09/20   Einar Pheasant, MD  levothyroxine (SYNTHROID) 25 MCG tablet TAKE 1  TABLET BY MOUTH BEFORE BREAKFAST 2-3 HOURS BEFORE TAKING ANY OTHER MEDICATION OR SUPPLEMENT 03/08/20   [name], MD  lidocaine (LIDODERM) 5 % Place 1 patch onto the skin daily. Remove & Discard patch within 12 hours. 10/06/18   [name], MD  Multiple Vitamins-Minerals (SUPER THERA VITE M) TABS Take by mouth.    [provider]  nitroGLYCERIN (NITROSTAT) 0.4 MG SL tablet Place 1 tablet (0.4 mg total) under the tongue every 5 (five) minutes as needed for chest pain. 10/19/19   Burnell Blanks, MD  OLANZapine (ZYPREXA) 10 MG tablet TAKE 1 TABLET BY MOUTH EVERYDAY AT BEDTIME 07/30/20   Einar Pheasant, MD  ondansetron (ZOFRAN) 4 MG tablet TAKE 1 TABLET (4 MG TOTAL) BY MOUTH 2 (TWO) TIMES DAILY AS NEEDED FOR NAUSEA OR VOMITING. 07/30/20   Einar Pheasant, MD  pantoprazole (PROTONIX) 40 MG tablet TAKE 1 TABLET BY MOUTH EVERY DAY 04/30/20   [name], MD  PRALUENT 75 MG/ML SOAJ every 14 (fourteen) days. 07/11/20   [provider]  rosuvastatin (CRESTOR) 40 MG tablet Take 1 tablet (40 mg total) by mouth daily. 03/23/20   [name], MD    Allergies    Abilify [aripiprazole], Augmentin [amoxicillin-pot clavulanate], Bactrim [sulfamethoxazole-trimethoprim], Ceftin [cefuroxime axetil], Coconut oil, Diclofenac, Dye fdc red [red dye], Indocin [indomethacin], Linaclotide, Lisinopril, Morphine, Morphine and related, Simvastatin, Sulfa antibiotics, Sulfamethoxazole-trimethoprim, Wellbutrin [bupropion], Zetia [ezetimibe], Quetiapine fumarate, and Quetiapine fumarate  Review of Systems   Review of Systems  Constitutional: Negative for chills and fever.  HENT: Positive for dental problem.   Eyes: Positive for discharge and redness. Negative for photophobia, pain and visual disturbance.  Respiratory: Negative for shortness of breath.   Cardiovascular: Negative for chest pain.  Gastrointestinal: Negative for nausea and vomiting.  Neurological: Negative for  dizziness, numbness and headaches.    Physical Exam Updated Vital Signs BP 125/71 (BP Location: Left Arm)   Pulse 70   Temp 98.9 F (37.2 C)   Resp 18   Ht 5\' 6"  (1.676 m)   Wt 100.2 kg   SpO2 96%   BMI 35.67 kg/m   Physical Exam Constitutional:      Appearance: She is well-developed and well-nourished.  HENT:     Head: Normocephalic and atraumatic.     Nose: Nose normal.     Mouth/Throat:     Mouth: Mucous membranes are moist.     Pharynx: No oropharyngeal exudate or posterior oropharyngeal erythema.     Comments: Diffuse dental decay, upper left incisor is fractured with soft tissue swelling around.  There is focal area of induration just posterior to the left upper central incisor consistent with possible abscess, this appears to be nonfluctuant slightly indurated with no drainage on compression. Eyes:     General:        Right eye: Discharge present.        Left eye: Discharge present.    Extraocular Movements: Extraocular movements intact.     Comments: Conjunctival erythema bilaterally with no visible defect or foreign body.  There is mild clear drainage bilaterally with mild matting of the eyelids.  Pupils are equal round and reactive to light.  EOMs intact.  No pain on exam, no surrounding cellulitis  Cardiovascular:     Rate and Rhythm: Normal rate.  Pulmonary:  Effort: Pulmonary effort is normal. No respiratory distress.  Musculoskeletal:        General: Normal range of motion.     Cervical back: Normal range of motion.  Skin:    General: Skin is warm.     Findings: No rash.  Neurological:     Mental Status: She is alert and oriented to person, place, and time.  Psychiatric:        Mood and Affect: Mood and affect normal.        Behavior: Behavior normal.        Thought Content: Thought content normal.     ED Results / Procedures / Treatments   Labs (all labs ordered are listed, but only abnormal results are displayed) Labs Reviewed - No data to  display  EKG None  Radiology No results found.  Procedures Procedures (including critical care time)  Medications Ordered in ED Medications  ciprofloxacin (CILOXAN) 0.3 % ophthalmic solution 1 drop (has no administration in time range)  clindamycin (CLEOCIN) capsule 300 mg (has no administration in time range)    ED Course  I have reviewed the triage vital signs and the nursing notes.  Pertinent labs & imaging results that were available during my care of the patient were reviewed by me and considered in my medical decision making (see chart for details).    MDM Rules/Calculators/A&P                          64 year old female with bilateral conjunctivitis and dental abscess.  Vision normal, vital signs stable.  Tolerating p.o. well.  She is started on ciprofloxacin eyedrops and clindamycin oral tablets x7 days.  She will follow-up with dental clinic.  She understands signs symptoms return to the ER for. Final Clinical Impression(s) / ED Diagnoses Final diagnoses:  Dental abscess  Acute bacterial conjunctivitis of both eyes    Rx / DC Orders ED Discharge Orders         Ordered    clindamycin (CLEOCIN) 300 MG capsule  4 times daily        08/10/20 2041    ciprofloxacin (CILOXAN) 0.3 % ophthalmic solution  Every 2 hours        08/10/20 2041           Renata Caprice 08/10/20 2049    Arta Silence, MD 08/10/20 2320

## 2020-08-10 NOTE — ED Notes (Signed)
Pt endorsing mouth abscess that has progressed to R and L eye pain, and redness. Pt presents with L eye redness. Pt endorsing "eyes crusting over"

## 2020-08-10 NOTE — ED Triage Notes (Signed)
Pt to ED for abscess to roof of mouth x 1 week. Also c/o irritation to left eye.  Redness to left eye noted, no swelling noted No redness or swelling around mouth noted  Ambulatory to triage, NAD noted

## 2020-08-10 NOTE — Discharge Instructions (Addendum)
Please take antibiotics as prescribed and use topical eyedrops as prescribed.  Return to the ER for any increasing pain swelling warmth redness or fevers.  Please follow-up with dental clinic first of next week.

## 2020-08-17 ENCOUNTER — Telehealth: Payer: Self-pay | Admitting: Family Medicine

## 2020-08-17 ENCOUNTER — Encounter: Payer: Self-pay | Admitting: Family Medicine

## 2020-08-17 DIAGNOSIS — N644 Mastodynia: Secondary | ICD-10-CM

## 2020-08-17 NOTE — Telephone Encounter (Signed)
Patient called in wanted to know if it some other test she can get because she is still having pain in her right breast

## 2020-08-17 NOTE — Telephone Encounter (Signed)
The mammogram she had was a screening mammogram. I would recommend completing a diagnostic mammogram. I can order this once you speak with her. Is this the same pain she saw Maudie Mercury for previously?

## 2020-08-17 NOTE — Telephone Encounter (Signed)
Patient called in wanted to know if it some other test she can get because she is still having pain in her right breast.  Krystyl Cannell,cma

## 2020-08-20 ENCOUNTER — Other Ambulatory Visit: Payer: Self-pay | Admitting: Internal Medicine

## 2020-08-20 NOTE — Telephone Encounter (Signed)
See phone note

## 2020-08-20 NOTE — Telephone Encounter (Signed)
LVM for the patient to call back. Ikaika Showers,cma  

## 2020-08-20 NOTE — Telephone Encounter (Signed)
Pt returned your call.  

## 2020-08-20 NOTE — Telephone Encounter (Signed)
I called the patient and she stated this is the same pain she had when she saw K. Jerelene Redden but it seems to be worse, she stated the pain is not constant it comes and goes.  The patient is okay with a diagnostic. Of her right breast.  Allia Wiltsey,cma

## 2020-08-21 NOTE — Addendum Note (Signed)
Addended by: Caryl Bis, Jery Hollern G on: 08/21/2020 01:07 PM   Modules accepted: Orders

## 2020-08-21 NOTE — Telephone Encounter (Signed)
Diagnostic mammogram ordered with [place].

## 2020-08-31 ENCOUNTER — Other Ambulatory Visit: Payer: Self-pay | Admitting: Internal Medicine

## 2020-08-31 ENCOUNTER — Other Ambulatory Visit: Payer: Self-pay | Admitting: Family Medicine

## 2020-08-31 DIAGNOSIS — E785 Hyperlipidemia, unspecified: Secondary | ICD-10-CM

## 2020-08-31 DIAGNOSIS — F250 Schizoaffective disorder, bipolar type: Secondary | ICD-10-CM

## 2020-08-31 DIAGNOSIS — R110 Nausea: Secondary | ICD-10-CM

## 2020-08-31 DIAGNOSIS — Z760 Encounter for issue of repeat prescription: Secondary | ICD-10-CM

## 2020-09-03 NOTE — Telephone Encounter (Signed)
Okay to refill? 

## 2020-09-06 ENCOUNTER — Emergency Department
Admission: EM | Admit: 2020-09-06 | Discharge: 2020-09-06 | Disposition: A | Discharge status: Home / Self Care | Payer: Medicare HMO | Attending: Emergency Medicine | Admitting: Emergency Medicine

## 2020-09-06 ENCOUNTER — Telehealth: Payer: Self-pay | Admitting: Obstetrics and Gynecology

## 2020-09-06 ENCOUNTER — Ambulatory Visit
Admission: RE | Admit: 2020-09-06 | Discharge: 2020-09-06 | Disposition: A | Discharge status: Home / Self Care | Payer: Medicare HMO | Source: Ambulatory Visit | Attending: Family Medicine | Admitting: Family Medicine

## 2020-09-06 ENCOUNTER — Other Ambulatory Visit: Payer: Self-pay

## 2020-09-06 ENCOUNTER — Telehealth: Payer: Self-pay | Admitting: Family Medicine

## 2020-09-06 DIAGNOSIS — Z7984 Long term (current) use of oral hypoglycemic drugs: Secondary | ICD-10-CM | POA: Diagnosis not present

## 2020-09-06 DIAGNOSIS — E039 Hypothyroidism, unspecified: Secondary | ICD-10-CM | POA: Diagnosis not present

## 2020-09-06 DIAGNOSIS — N760 Acute vaginitis: Secondary | ICD-10-CM | POA: Insufficient documentation

## 2020-09-06 DIAGNOSIS — N939 Abnormal uterine and vaginal bleeding, unspecified: Secondary | ICD-10-CM | POA: Diagnosis not present

## 2020-09-06 DIAGNOSIS — M545 Low back pain, unspecified: Secondary | ICD-10-CM | POA: Diagnosis not present

## 2020-09-06 DIAGNOSIS — R922 Inconclusive mammogram: Secondary | ICD-10-CM | POA: Diagnosis not present

## 2020-09-06 DIAGNOSIS — I2510 Atherosclerotic heart disease of native coronary artery without angina pectoris: Secondary | ICD-10-CM | POA: Insufficient documentation

## 2020-09-06 DIAGNOSIS — I110 Hypertensive heart disease with heart failure: Secondary | ICD-10-CM | POA: Diagnosis not present

## 2020-09-06 DIAGNOSIS — N842 Polyp of vagina: Secondary | ICD-10-CM | POA: Diagnosis not present

## 2020-09-06 DIAGNOSIS — Z7901 Long term (current) use of anticoagulants: Secondary | ICD-10-CM | POA: Insufficient documentation

## 2020-09-06 DIAGNOSIS — N644 Mastodynia: Secondary | ICD-10-CM | POA: Insufficient documentation

## 2020-09-06 DIAGNOSIS — B9689 Other specified bacterial agents as the cause of diseases classified elsewhere: Secondary | ICD-10-CM

## 2020-09-06 DIAGNOSIS — Z87891 Personal history of nicotine dependence: Secondary | ICD-10-CM | POA: Insufficient documentation

## 2020-09-06 DIAGNOSIS — I5032 Chronic diastolic (congestive) heart failure: Secondary | ICD-10-CM | POA: Diagnosis not present

## 2020-09-06 DIAGNOSIS — R300 Dysuria: Secondary | ICD-10-CM | POA: Insufficient documentation

## 2020-09-06 DIAGNOSIS — Z96642 Presence of left artificial hip joint: Secondary | ICD-10-CM | POA: Insufficient documentation

## 2020-09-06 DIAGNOSIS — Z79899 Other long term (current) drug therapy: Secondary | ICD-10-CM | POA: Diagnosis not present

## 2020-09-06 DIAGNOSIS — E119 Type 2 diabetes mellitus without complications: Secondary | ICD-10-CM | POA: Insufficient documentation

## 2020-09-06 LAB — URINALYSIS, COMPLETE (UACMP) WITH MICROSCOPIC
Bilirubin Urine: NEGATIVE
Glucose, UA: >=500 mg/dL — AB
Ketones, ur: NEGATIVE mg/dL
Leukocytes,Ua: NEGATIVE
Nitrite: NEGATIVE
Protein, ur: NEGATIVE mg/dL
Specific Gravity, Urine: 1.03 (ref 1.005–1.030)
pH: 5 (ref 5.0–8.0)

## 2020-09-06 LAB — CBC WITH DIFFERENTIAL/PLATELET
Abs Immature Granulocytes: 0.03 10*3/uL (ref 0.00–0.07)
Basophils Absolute: 0.1 10*3/uL (ref 0.0–0.1)
Basophils Relative: 1 %
Eosinophils Absolute: 0.5 10*3/uL (ref 0.0–0.5)
Eosinophils Relative: 6 %
HCT: 36.6 % (ref 36.0–46.0)
Hemoglobin: 12.7 g/dL (ref 12.0–15.0)
Immature Granulocytes: 0 %
Lymphocytes Relative: 39 %
Lymphs Abs: 3.4 10*3/uL (ref 0.7–4.0)
MCH: 32.2 pg (ref 26.0–34.0)
MCHC: 34.7 g/dL (ref 30.0–36.0)
MCV: 92.9 fL (ref 80.0–100.0)
Monocytes Absolute: 0.5 10*3/uL (ref 0.1–1.0)
Monocytes Relative: 5 %
Neutro Abs: 4.4 10*3/uL (ref 1.7–7.7)
Neutrophils Relative %: 49 %
Platelets: 334 10*3/uL (ref 150–400)
RBC: 3.94 MIL/uL (ref 3.87–5.11)
RDW: 13.6 % (ref 11.5–15.5)
WBC: 8.9 10*3/uL (ref 4.0–10.5)
nRBC: 0 % (ref 0.0–0.2)

## 2020-09-06 LAB — BASIC METABOLIC PANEL
Anion gap: 9 (ref 5–15)
BUN: 9 mg/dL (ref 8–23)
CO2: 26 mmol/L (ref 22–32)
Calcium: 9.3 mg/dL (ref 8.9–10.3)
Chloride: 106 mmol/L (ref 98–111)
Creatinine, Ser: 0.83 mg/dL (ref 0.44–1.00)
GFR, Estimated: >60 mL/min (ref >60)
Glucose, Bld: 123 mg/dL — ABNORMAL HIGH (ref 70–99)
Potassium: 3.8 mmol/L (ref 3.5–5.1)
Sodium: 141 mmol/L (ref 135–145)

## 2020-09-06 LAB — WET PREP, GENITAL
Sperm: NONE SEEN
Trich, Wet Prep: NONE SEEN
Yeast Wet Prep HPF POC: NONE SEEN

## 2020-09-06 MED ORDER — METRONIDAZOLE 500 MG PO TABS: 500.0000 mg | ORAL_TABLET | Freq: Two times a day (BID) | ORAL | 0 refills | Status: AC

## 2020-09-06 MED ORDER — METRONIDAZOLE 500 MG PO TABS
500.0000 mg | ORAL_TABLET | Freq: Once | ORAL | Status: AC
  Administered 2020-09-06: 500 mg via ORAL
  Filled 2020-09-06: qty 1

## 2020-09-06 NOTE — Telephone Encounter (Signed)
Patient stated that she had deep red blood and what looked to be clots in it. Would like a call back. Patient has been advised that SDJ in out of office. Pt had hysterectomy in 1985. No currently sexually active  CB# [phone]

## 2020-09-06 NOTE — Telephone Encounter (Signed)
Patient called and said she went to the bathroom and she is hemorrhaging from her vaginal. Patient stated she had a hysterectomy in 2011. Patient was advised by Olegario Messier to either call her OBGYN or go to the hospital.

## 2020-09-06 NOTE — Discharge Instructions (Signed)
Your exam and labs reveal some BV which is treated with pills. You should follow-up with Dr. Edison Pace office for further evaluation of your vaginal polyp. Return as needed.

## 2020-09-06 NOTE — ED Provider Notes (Signed)
[facility] Emergency Department Provider Note  ____________________________________________   Event Date/Time   First MD Initiated Contact with Patient 09/06/20 1541     (approximate)  I have reviewed the triage vital signs and the nursing notes.   HISTORY  Chief Complaint Vaginal Bleeding  HPI Valerie Vazquez is a 65 y.o. female status post hysterectomy/salpingectomy with bilateral ovaries intact, presents for an episode of abnormal vaginal bleeding.  Patient describes sudden onset of a gush to her panties, as she was Leaving to have for an appointment.  Patient was unclear and thought she had possibly an episode of incontinence.  She went to the bathroom, noted dark blood with clots that she wipe with toilet tissue.  Patient proceeded to an appointment she had scheduled for routine mammogram, and then presented herself to the ED at the advice of her GYN provider.  She presents now with no active or ongoing bleeding.  She is unclear as to whether the bleeding was rectal or vaginal in nature.  She reports only some mild dysuria, and some intermittent low back pain but denies any fever, chills, or sweats.  Also denies any abdominal pain, rectal pain, or vaginal discomfort.  Patient denies any recent vaginal instrumentation, or recent intercourse.       Past Medical History:  Diagnosis Date  . Acute right-sided low back pain with right-sided sciatica 10/23/2017  . Anemia   . Bilateral renal cysts   . CAD (coronary artery disease) 09/28/2016  . Chest pain 10/14/2015  . Chronic back pain    "upper and lower" (09/19/2014)  . Chronic shoulder pain (Location of Primary Source of Pain) (Left) 01/29/2016  . Colitis 04/08/2016  . Coronary vasospasm ([place]) 12/09/2014  . Depression    "major" (09/19/2014)  . DVT (deep venous thrombosis) ([place])   . Esophageal spasm   . Essential hypertension 10/28/2013  . Gallstones   . Gastritis   . Gastroenteritis   . GERD  (gastroesophageal reflux disease)   . Headache    "couple /month lately" (09/19/2014)  . Heart disease 12/09/2016  . High blood pressure   . High cholesterol   . History of blood transfusion 1985   related to hysterectomy  . History of nuclear stress test    Myoview 1/19:  EF 65, no ischemia or scar  . History of pulmonary embolus (PE) 09/19/2014  . Hx of cervical spine surgery 01/29/2016  . 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 Rule out Paget's disease of the bone with blood work.   . Hyperlipidemia 10/28/2013  . Hypertension   . IBS (irritable bowel syndrome)   . Lumbar central spinal stenosis (moderate at L4-5; mild at L2-3 and L3-4) 01/29/2016  . Migraine    "a few times/yr" (09/19/2014)  . Myocardial infarction ([place]) 10/2010 X 3   "while hospitalized"  . Neck pain 01/29/2016  . Osteoarthritis    "qwhere; mostly around my joints" (09/19/2014)  . Pleural effusion, bilateral 07/31/2015  . Pneumonia 04/2014  . PTSD (post-traumatic stress disorder)   . Pulmonary embolism ([place]) 04/2001; 09/19/2014   "after gallbladder OR; "  . Schizoaffective disorder ([place])   . Schizoaffective disorder, depressive type ([place]) 08/28/2016  . Sleep apnea    "mild" (09/19/2014)  . Snoring    a. sleep study 5/16: No OSA  . SOB (shortness of breath) 07/31/2015  . Spinal stenosis   . Spondylosis   . Suicide attempt ([place]) 08/27/2016  . Type II diabetes mellitus (  HCC)    "dx'd in 2007; lost weight; no RX for ~ 4 yr now" (09/19/2014)  . Wheezing 02/25/2017    Patient Active Problem List   Diagnosis Date Noted  . Bilateral low back pain without sciatica 06/20/2020  . Surgery follow-up examination 04/12/2020  . Paronychia of great toe of right foot 04/05/2020  . Paronychia of great toe, right 02/27/2020  . Foraminal stenosis of cervical region 01/31/2020  . Cervical fusion syndrome (C5-C7 2014) 01/31/2020  . Ingrown toenail 01/27/2020  . Chronic diastolic heart failure (HCC)  42/35/3614  . Postmenopausal estrogen deficiency 08/12/2019  . Cysts of both ovaries 06/15/2019  . Left wrist pain 05/20/2019  . Allergic rhinitis 01/27/2019  . Right hip pain 10/06/2018  . Dental infection 03/21/2018  . Urinary incontinence 10/27/2017  . Elevated alkaline phosphatase level 10/27/2017  . Subclinical hypothyroidism 08/26/2017  . Diabetes mellitus without complication (HCC) 05/29/2017  . Colon polyp 01/30/2017  . Right upper quadrant pain 01/12/2017  . Plantar fasciitis of left foot 11/07/2016  . CAD (coronary artery disease) 09/28/2016  . Epigastric discomfort 09/16/2016  . Schizoaffective disorder, depressive type (HCC) 08/28/2016  . Colitis 04/08/2016  . Cervicalgia 01/29/2016  . Chronic pain syndrome 01/29/2016  . Osteoarthritis of hip (Bilateral) 01/29/2016  . Lumbar central spinal stenosis (moderate at L4-5; mild at L2-3 and L3-4) 01/29/2016  . Pleural effusion, bilateral 07/31/2015  . Esophageal spasm 06/05/2015  . History of DVT (deep vein thrombosis) 02/25/2015  . 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 Constipation 12/12/2014  . Coronary vasospasm (HCC) 12/09/2014  . History of pulmonary embolus (PE) 09/19/2014  . Anxiety and depression 04/13/2014  . IBS (irritable bowel syndrome) 04/11/2014  . GERD (gastroesophageal reflux disease) 04/11/2014  . Essential hypertension 10/28/2013  . Anemia 10/28/2013  . Hyperlipidemia 10/28/2013    Past Surgical History:  Procedure Laterality Date  . ABDOMINAL HYSTERECTOMY  1985  . ANTERIOR CERVICAL DECOMP/DISCECTOMY FUSION  10/2012   in [place] C5-C7   . APPENDECTOMY  1985  . BACK SURGERY    . BREAST CYST EXCISION Right   . BREAST EXCISIONAL BIOPSY Right YRS AGO   NEG  . CARDIAC CATHETERIZATION   2000's; 2009  . CHOLECYSTECTOMY  2002  . COLONOSCOPY WITH PROPOFOL N/A 12/12/2016   Procedure: COLONOSCOPY WITH PROPOFOL;  Surgeon: Wyline Mood, MD;  Location: [facility] ENDOSCOPY;  Service: Endoscopy;  Laterality: N/A;  .  COLONOSCOPY WITH PROPOFOL N/A 02/17/2017   Procedure: COLONOSCOPY WITH PROPOFOL;  Surgeon: Wyline Mood, MD;  Location: [facility] ENDOSCOPY;  Service: Gastroenterology;  Laterality: N/A;  . ESOPHAGOGASTRODUODENOSCOPY (EGD) WITH PROPOFOL N/A 02/17/2017   Procedure: ESOPHAGOGASTRODUODENOSCOPY (EGD) WITH PROPOFOL;  Surgeon: Wyline Mood, MD;  Location[facility] ENDOSCOPY;  Service: Gastroenterology;  Laterality: N/A;  . ESOPHAGOGASTRODUODENOSCOPY (EGD) WITH PROPOFOL N/A 03/07/2019   Procedure: ESOPHAGOGASTRODUODENOSCOPY (EGD) WITH PROPOFOL;  Surgeon: Wyline Mood, MD;  Location: [facility] ENDOSCOPY;  Service: Gastroenterology;  Laterality: N/A;  . EXCISION/RELEASE BURSA HIP Right 12/1984  . HERNIA REPAIR  1985  . LEFT HEART CATHETERIZATION WITH CORONARY ANGIOGRAM N/A 12/09/2014   Procedure: LEFT HEART CATHETERIZATION WITH CORONARY ANGIOGRAM;  Surgeon: Kathleene Hazel, MD;  Location: [facility] CATH LAB;  Service: Cardiovascular;  Laterality: N/A;  . TONSILLECTOMY AND ADENOIDECTOMY  ~ 1968  . TOTAL HIP ARTHROPLASTY Left 04-06-2014  . UMBILICAL HERNIA REPAIR  1985  . VENA CAVA FILTER PLACEMENT  03/2014    Prior to Admission medications   Medication Sig Start Date End Date Taking? Authorizing Provider  metroNIDAZOLE (FLAGYL) 500 MG tablet Take  1 tablet (500 mg total) by mouth 2 (two) times daily for 7 days. 09/06/20 09/13/20 Yes Satvik Parco, Dannielle Karvonen, PA-C  acetaminophen (TYLENOL) 500 MG tablet Take 1 tablet (500 mg total) by mouth every 6 (six) hours as needed. 09/29/18   Laban Emperor, PA-C  baclofen (LIORESAL) 10 MG tablet TAKE 1 TABLET BY MOUTH THREE TIMES A DAY AS NEEDED FOR MUSCLE SPASMS 08/21/20   Einar Pheasant, MD  carvedilol (COREG) 3.125 MG tablet TAKE 1 TABLET (3.125 MG TOTAL) BY MOUTH 2 (TWO) TIMES DAILY WITH A MEAL. 07/30/20   Burnell Blanks, MD  cetirizine (ZYRTEC) 10 MG tablet TAKE 1 TABLET BY MOUTH EVERY DAY 02/27/20   [name], MD  ciprofloxacin (CILOXAN) 0.3 % ophthalmic solution Place 1  drop into both eyes every 2 (two) hours. Administer 1 drop, every 2 hours, while awake, for 2 days. Then 1 drop, every 4 hours, while awake, for the next 5 days. 08/10/20   Duanne Hemrick, PA-C  dicyclomine (BENTYL) 20 MG tablet TAKE 1 TABLET BY MOUTH THREE TIMES A DAY AS NEEDED FOR MUSCLE SPASMS 12/05/19   [name], MD  diphenhydrAMINE (BENADRYL) 25 MG tablet Take 1 tablet (25 mg total) by mouth every 6 (six) hours. 09/12/16   Jola Schmidt, MD  ELIQUIS 5 MG TABS tablet TAKE 1 TABLET BY MOUTH TWICE A DAY 03/28/20   [name], MD  escitalopram (LEXAPRO) 20 MG tablet TAKE 1 TABLET BY MOUTH EVERY DAY 07/25/20   [name], MD  famotidine (PEPCID) 20 MG tablet TAKE 1 TABLET BY MOUTH EVERYDAY AT BEDTIME. 05/03/20   [name], MD  furosemide (LASIX) 20 MG tablet TAKE 1 TABLET (20 MG TOTAL) BY MOUTH DAILY AS NEEDED FOR EDEMA. 04/09/20   Einar Pheasant, MD  gabapentin (NEURONTIN) 600 MG tablet TAKE 1 TABLET IN THE AM AND 1.5 TABLETS AT BEDTIME 07/16/20   [name], MD  glucose blood test strip Check once daily, dispense per patient and insurance preference, dx code E11.9 12/20/19   [name], MD  isosorbide mononitrate (IMDUR) 30 MG 24 hr tablet Take 30 mg by mouth daily.    [provider]  JANUVIA 100 MG tablet TAKE 1 TABLET BY MOUTH EVERY DAY 07/30/20   [name], MD  JARDIANCE 25 MG TABS tablet TAKE 1 TABLET BY MOUTH EVERY DAY 07/04/20   [name], MD  KLOR-CON M20 20 MEQ tablet TAKE 1 TABLET (20 MEQ TOTAL) BY MOUTH DAILY AS NEEDED (TAKE WITH LASIX). 04/09/20   Einar Pheasant, MD  levothyroxine (SYNTHROID) 25 MCG tablet TAKE 1 TABLET BY MOUTH BEFORE BREAKFAST 2-3 HOURS BEFORE TAKING ANY OTHER MEDICATION OR SUPPLEMENT 03/08/20   [name], MD  lidocaine (LIDODERM) 5 % Place 1 patch onto the skin daily. Remove & Discard patch within 12 hours. 10/06/18   [name], MD  Multiple Vitamins-Minerals (SUPER THERA VITE M) TABS  Take by mouth.    [provider]  nitroGLYCERIN (NITROSTAT) 0.4 MG SL tablet Place 1 tablet (0.4 mg total) under the tongue every 5 (five) minutes as needed for chest pain. 10/19/19   Burnell Blanks, MD  OLANZapine (ZYPREXA) 10 MG tablet TAKE 1 TABLET BY MOUTH EVERYDAY AT BEDTIME 09/03/20   [name], MD  ondansetron (ZOFRAN) 4 MG tablet TAKE 1 TABLET (4 MG TOTAL) BY MOUTH 2 (TWO) TIMES DAILY AS NEEDED FOR NAUSEA OR VOMITING. 09/04/20   [name], MD  pantoprazole (PROTONIX) 40 MG tablet TAKE 1 TABLET BY MOUTH EVERY DAY 09/03/20   Glori Luis, MD  PRALUENT 75 MG/ML SOAJ every 14 (fourteen) days. 07/11/20   [provider]  rosuvastatin (CRESTOR) 40 MG tablet TAKE 1 TABLET BY MOUTH EVERY DAY 09/03/20   Glori Luis, MD    Allergies Abilify [aripiprazole], Augmentin [amoxicillin-pot clavulanate], Bactrim [sulfamethoxazole-trimethoprim], Ceftin [cefuroxime axetil], Coconut oil, Diclofenac, Dye fdc red [red dye], Indocin [indomethacin], Linaclotide, Lisinopril, Morphine, Morphine and related, Simvastatin, Sulfa antibiotics, Sulfamethoxazole-trimethoprim, Wellbutrin [bupropion], Zetia [ezetimibe], Quetiapine fumarate, and Quetiapine fumarate  Family History  Problem Relation Age of Onset  . Stroke Mother        Deceased  . Lung cancer Father        Deceased  . Healthy Daughter   . Healthy Son        x 2  . Other Son        Suicide  . Cancer Brother   . Heart attack Neg Hx     Social History Social History   Tobacco Use  . Smoking status: Former Smoker    Packs/day: 1.50    Years: 10.00    Pack years: 15.00    Types: Cigarettes    Quit date: 04/12/1979    Years since quitting: 41.4  . Smokeless tobacco: Never Used  Vaping Use  . Vaping Use: Never used  Substance Use Topics  . Alcohol use: No    Alcohol/week: 0.0 standard drinks  . Drug use: No    Review of Systems  Constitutional: No fever/chills Eyes: No visual  changes. ENT: No sore throat. Cardiovascular: Denies chest pain. Respiratory: Denies shortness of breath. Gastrointestinal: No abdominal pain.  No nausea, no vomiting.  No diarrhea.  No constipation. Genitourinary: Negative for dysuria. Report vaginal bleeding as above Musculoskeletal: Negative for back pain. Skin: Negative for rash. Neurological: Negative for headaches, focal weakness or numbness. ____________________________________________   PHYSICAL EXAM:  VITAL SIGNS: ED Triage Vitals  Enc Vitals Group     BP 09/06/20 1452 136/86     Pulse Rate 09/06/20 1452 79     Resp 09/06/20 1452 18     Temp 09/06/20 1452 99.3 F (37.4 C)     Temp Source 09/06/20 1452 Oral     SpO2 09/06/20 1452 94 %     Weight 09/06/20 1457 224 lb (101.6 kg)     Height 09/06/20 1457 5\' 6"  (1.676 m)     Head Circumference --      Peak Flow --      Pain Score 09/06/20 1457 0     Pain Loc --      Pain Edu? --      Excl. in GC? --    Constitutional: Alert and oriented. Well appearing and in no acute distress. Eyes: Conjunctivae are normal. PERRL. EOMI. Head: Atraumatic. Nose: No congestion/rhinnorhea. Mouth/Throat: Mucous membranes are moist.  Oropharynx non-erythematous. Neck: No stridor.   Cardiovascular: Normal rate, regular rhythm. Grossly normal heart sounds.  Good peripheral circulation. Respiratory: Normal respiratory effort.  No retractions. Lungs CTAB. Gastrointestinal: Soft and nontender. No distention. No abdominal bruits. No CVA tenderness. Genitourinary: Normal external genitalia. Anterior polyp noted in the introitus. It appears to arise from the superior and proximal portion of the vagina. It is ecchymotic on the superior portion, consistent with history of bleeding.  No other lesions noted in the vault. No friability on exam.  Musculoskeletal: No lower extremity tenderness nor edema.  No joint effusions.  Neurologic:  Normal speech and language. No gross focal neurologic deficits are  appreciated. No gait instability. Skin:  Skin is warm, dry and intact. No rash noted. Psychiatric: Mood and affect are normal. Speech and behavior are normal. ____________________________________________   LABS (all labs ordered are listed, but only abnormal results are displayed)  Labs Reviewed  WET PREP, GENITAL - Abnormal; Notable for the following components:      Result Value   Clue Cells Wet Prep HPF POC PRESENT (*)    WBC, Wet Prep HPF POC MODERATE (*)    All other components within normal limits  BASIC METABOLIC PANEL - Abnormal; Notable for the following components:   Glucose, Bld 123 (*)    All other components within normal limits  URINALYSIS, COMPLETE (UACMP) WITH MICROSCOPIC - Abnormal; Notable for the following components:   Color, Urine YELLOW (*)    APPearance HAZY (*)    Glucose, UA >=500 (*)    Hgb urine dipstick MODERATE (*)    Bacteria, UA RARE (*)    All other components within normal limits  CBC WITH DIFFERENTIAL/PLATELET  ____________________________________________  RADIOLOGY I, Melvenia Needles, personally viewed and evaluated these images (plain radiographs) as part of my medical decision making, as well as reviewing the written report by the radiologist.  ED MD interpretation:   ____________________________________________   PROCEDURES  Procedure(s) performed (including Critical Care):  Procedures ____________________________________________   INITIAL IMPRESSION / ASSESSMENT AND PLAN / ED COURSE  As part of my medical decision making, I reviewed the following data within the [place] notes reviewed and incorporated, Notes from prior ED visits and [place] Controlled Substance Database     ED evaluation of sudden vaginal bleeding.  Patient is post abdominal hysterectomy with no recent abdominal or pelvic surgeries.  She presents for evaluation of symptoms but she is found to have a polyp-like lesion in the vagina that  appears to be the source of her discomfort and bleeding.  Her labs otherwise benign to return at this time without any signs of acute anemia or infection.  Patient's urinalysis is reassuring at this time, and wet prep does reveal some clue cells patient be treated empirically with metronidazole, and is encouraged to follow-up with her GYN provider for definitive management.  Return precautions have been discussed. ____________________________________________   FINAL CLINICAL IMPRESSION(S) / ED DIAGNOSES  Final diagnoses:  Vaginal bleeding  Vaginal polyp  BV (bacterial vaginosis)     ED Discharge Orders         Ordered    metroNIDAZOLE (FLAGYL) 500 MG tablet  2 times daily        09/06/20 1824          *Please note:  Valerie Vazquez was evaluated in Emergency Department on 09/06/2020 for the symptoms described in the history of present illness. She was evaluated in the context of the global COVID-19 pandemic, which necessitated consideration that the patient might be at risk for infection with the SARS-CoV-2 virus that causes COVID-19. Institutional protocols and algorithms that pertain to the evaluation of patients at risk for COVID-19 are in a state of rapid change based on information released by regulatory bodies including the CDC and federal and state organizations. These policies and algorithms were followed during the patient's care in the ED.  Some ED evaluations and interventions may be delayed as a result of limited staffing during and the pandemic.*   Note:  This document was prepared using Dragon voice  recognition software and may include unintentional dictation errors.    Melvenia Needles, PA-C 09/06/20 1854    Delman Kitten, MD 09/06/20 2212

## 2020-09-06 NOTE — ED Triage Notes (Signed)
Pt to ED for chief complaint of assumed vaginal bleeding that started this morning. Pt states she thinks it is from her vagina and not her rectum.  Pt asked to go to restroom, no blood noted in toilet after using restroom.  Pt ambulatory to triage. Alert and oriented

## 2020-09-06 NOTE — Telephone Encounter (Signed)
Pt states she went to ER.

## 2020-09-08 ENCOUNTER — Other Ambulatory Visit: Payer: Self-pay | Admitting: Family Medicine

## 2020-09-08 DIAGNOSIS — R110 Nausea: Secondary | ICD-10-CM

## 2020-09-08 DIAGNOSIS — F250 Schizoaffective disorder, bipolar type: Secondary | ICD-10-CM

## 2020-09-10 ENCOUNTER — Other Ambulatory Visit: Payer: Self-pay

## 2020-09-10 ENCOUNTER — Encounter: Payer: Self-pay | Admitting: Obstetrics and Gynecology

## 2020-09-10 ENCOUNTER — Ambulatory Visit (INDEPENDENT_AMBULATORY_CARE_PROVIDER_SITE_OTHER): Payer: Medicare HMO | Admitting: Obstetrics and Gynecology

## 2020-09-10 VITALS — BP 128/88 | Ht 66.0 in | Wt 226.0 lb

## 2020-09-10 DIAGNOSIS — N342 Other urethritis: Secondary | ICD-10-CM | POA: Diagnosis not present

## 2020-09-10 NOTE — Progress Notes (Signed)
Obstetrics & Gynecology Office Visit   Chief Complaint  Patient presents with  . Follow-up  ER follow up from 09/06/2020  History of Present Illness: 65 y.o. [id number] female who presents in follow up from an ER visit for vaginal bleeding. She has a history of total abdominal hysterectomy.  There was not bleeding noted on exam.  A polypoid lesion was noted near the introitus anteriorly.  There was no clear etiology for her bleeding. Four days ago she felt something "squishy" in her pants. She noted a large amount of brown blood with clots.  She slowly started tapering off until two days ago and she has noted no bleeding since that time. She states that she has never had any blood on the tissue when she wipes after a bowel movement.  She denies trauma, pain, urinary symptoms, GI symptoms. She denies weight loss, early satiety, new constipation.  Past Medical History:  Diagnosis Date  . Acute right-sided low back pain with right-sided sciatica 10/23/2017  . Anemia   . Bilateral renal cysts   . CAD (coronary artery disease) 09/28/2016  . Chest pain 10/14/2015  . Chronic back pain    "upper and lower" (09/19/2014)  . Chronic shoulder pain (Location of Primary Source of Pain) (Left) 01/29/2016  . Colitis 04/08/2016  . Coronary vasospasm ([place]) 12/09/2014  . Depression    "major" (09/19/2014)  . DVT (deep venous thrombosis) ([place])   . Esophageal spasm   . Essential hypertension 10/28/2013  . Gallstones   . Gastritis   . Gastroenteritis   . GERD (gastroesophageal reflux disease)   . Headache    "couple /month lately" (09/19/2014)  . Heart disease 12/09/2016  . High blood pressure   . High cholesterol   . History of blood transfusion 1985   related to hysterectomy  . History of nuclear stress test    Myoview 1/19:  EF 65, no ischemia or scar  . History of pulmonary embolus (PE) 09/19/2014  . Hx of cervical spine surgery 01/29/2016  . 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 Rule  out Paget's disease of the bone with blood work.   . Hyperlipidemia 10/28/2013  . Hypertension   . IBS (irritable bowel syndrome)   . Lumbar central spinal stenosis (moderate at L4-5; mild at L2-3 and L3-4) 01/29/2016  . Migraine    "a few times/yr" (09/19/2014)  . Myocardial infarction ([place]) 10/2010 X 3   "while hospitalized"  . Neck pain 01/29/2016  . Osteoarthritis    "qwhere; mostly around my joints" (09/19/2014)  . Pleural effusion, bilateral 07/31/2015  . Pneumonia 04/2014  . PTSD (post-traumatic stress disorder)   . Pulmonary embolism ([place]) 04/2001; 09/19/2014   "after gallbladder OR; "  . Schizoaffective disorder ([place])   . Schizoaffective disorder, depressive type ([place]) 08/28/2016  . Sleep apnea    "mild" (09/19/2014)  . Snoring    a. sleep study 5/16: No OSA  . SOB (shortness of breath) 07/31/2015  . Spinal stenosis   . Spondylosis   . Suicide attempt ([place]) 08/27/2016  . Type II diabetes mellitus ([place])    "dx'd in 2007; lost weight; no RX for ~ 4 yr now" (09/19/2014)  . Wheezing 02/25/2017    Past Surgical History:  Procedure Laterality Date  . ABDOMINAL HYSTERECTOMY  1985  . ANTERIOR CERVICAL DECOMP/DISCECTOMY FUSION  10/2012   in [place] C5-C7   . APPENDECTOMY  1985  . BACK SURGERY    . BREAST CYST EXCISION Right   .  BREAST EXCISIONAL BIOPSY Right YRS AGO   NEG  . CARDIAC CATHETERIZATION   2000's; 2007/12/12  . CHOLECYSTECTOMY  12-11-00  . COLONOSCOPY WITH PROPOFOL N/A 12/12/2016   Procedure: COLONOSCOPY WITH PROPOFOL;  Surgeon: [name], MD;  Location: [facility] ENDOSCOPY;  Service: Endoscopy;  Laterality: N/A;  . COLONOSCOPY WITH PROPOFOL N/A 02/17/2017   Procedure: COLONOSCOPY WITH PROPOFOL;  Surgeon: [name], MD;  Location: [facility] ENDOSCOPY;  Service: Gastroenterology;  Laterality: N/A;  . ESOPHAGOGASTRODUODENOSCOPY (EGD) WITH PROPOFOL N/A 02/17/2017   Procedure: ESOPHAGOGASTRODUODENOSCOPY (EGD) WITH PROPOFOL;  Surgeon: [name], MD;  Location[facility] END[facility]COPY;  Service:  Gastroenterology;  Laterality: N/A;  . ESOPHAGOGASTRODUODENOSCOPY (EGD) WITH PROPOFOL N/A 03/07/2019   Procedure: ESOPHAGOGASTRODUODENOSCOPY (EGD) WITH PROPOFOL;  Surgeon: [name], MD;  Location: [facility] ENDOSCOPY;  Service: Gastroenterology;  Laterality: N/A;  . EXCISION/RELEASE BURSA HIP Right 12/1984  . HERNIA REPAIR  1985  . LEFT HEART CATHETERIZATION WITH CORONARY ANGIOGRAM N/A December 12, 2014   Procedure: LEFT HEART CATHETERIZATION WITH CORONARY ANGIOGRAM;  Surgeon: Burnell Blanks, MD;  Location: [facility] CATH LAB;  Service: Cardiovascular;  Laterality: N/A;  . TONSILLECTOMY AND ADENOIDECTOMY  ~ 12-12-1966  . TOTAL HIP ARTHROPLASTY Left 04-06-2014  . UMBILICAL HERNIA REPAIR  1985  . VENA CAVA FILTER PLACEMENT  03/2014    Gynecologic History: No LMP recorded. Patient has had a hysterectomy.  Obstetric History: [id number]  Family History  Problem Relation Age of Onset  . Stroke Mother        Deceased  . Lung cancer Father        Deceased  . Healthy Daughter   . Healthy Son        x 2  . Other Son        Suicide  . Cancer Brother   . Heart attack Neg Hx     Social History   Socioeconomic History  . Marital status: Divorced    Spouse name: Not on file  . Number of children: Not on file  . Years of education: Not on file  . Highest education level: Not on file  Occupational History  . Not on file  Tobacco Use  . Smoking status: Former Smoker    Packs/day: 1.50    Years: 10.00    Pack years: 15.00    Types: Cigarettes    Quit date: 04/12/1979    Years since quitting: 41.4  . Smokeless tobacco: Never Used  Vaping Use  . Vaping Use: Never used  Substance and Sexual Activity  . Alcohol use: No    Alcohol/week: 0.0 standard drinks  . Drug use: No  . Sexual activity: Not Currently  Other Topics Concern  . Not on file  Social History Narrative   Lives with fiancee in an apartment on the first floor.  Has 3 grown children.  One child passed away.  On disability 1994/12/12 - 2000, 2007 -  current.     Education: some college.   Social Determinants of Health   Financial Resource Strain: Not on file  Food Insecurity: No Food Insecurity  . Worried About Charity fundraiser in the Last Year: Never true  . Ran Out of Food in the Last Year: Never true  Transportation Needs: No Transportation Needs  . Lack of Transportation (Medical): No  . Lack of Transportation (Non-Medical): No  Physical Activity: Not on file  Stress: Not on file  Social Connections: Not on file  Intimate Partner Violence: Not on file    Allergies  Allergen Reactions  .  Abilify [Aripiprazole] Other (See Comments)    Jerking movement, slow motor skills  . Augmentin [Amoxicillin-Pot Clavulanate] Diarrhea and Nausea And Vomiting  . Bactrim [Sulfamethoxazole-Trimethoprim] Diarrhea  . Ceftin [Cefuroxime Axetil] Diarrhea  . Coconut Oil     Rash   . Diclofenac Other (See Comments)     Muscle Pain  . Dye Fdc Red [Red Dye]     "hair dye"  . Indocin [Indomethacin] Other (See Comments)    Migraines   . Linaclotide Diarrhea  . Lisinopril Diarrhea and Other (See Comments)    Other reaction(s): Dizziness, Vomiting  . Morphine Other (See Comments)    Chest Tightness  . Morphine And Related Other (See Comments)    Chest tightness   . Simvastatin Other (See Comments)    Muscle Pain  . Sulfa Antibiotics Diarrhea  . Sulfamethoxazole-Trimethoprim Diarrhea  . Wellbutrin [Bupropion] Other (See Comments)    Jerking movement Jerking movement Slurred speech  . Zetia [Ezetimibe]     Diarrhea   . Quetiapine Fumarate Palpitations  . Quetiapine Fumarate Palpitations    Prior to Admission medications   Medication Sig Start Date End Date Taking? Authorizing Provider  acetaminophen (TYLENOL) 500 MG tablet Take 1 tablet (500 mg total) by mouth every 6 (six) hours as needed. 09/29/18   Laban Emperor, PA-C  baclofen (LIORESAL) 10 MG tablet TAKE 1 TABLET BY MOUTH THREE TIMES A DAY AS NEEDED FOR MUSCLE SPASMS  08/21/20   Einar Pheasant, MD  carvedilol (COREG) 3.125 MG tablet TAKE 1 TABLET (3.125 MG TOTAL) BY MOUTH 2 (TWO) TIMES DAILY WITH A MEAL. 07/30/20   Burnell Blanks, MD  cetirizine (ZYRTEC) 10 MG tablet TAKE 1 TABLET BY MOUTH EVERY DAY 02/27/20   [name], MD  ciprofloxacin (CILOXAN) 0.3 % ophthalmic solution Place 1 drop into both eyes every 2 (two) hours. Administer 1 drop, every 2 hours, while awake, for 2 days. Then 1 drop, every 4 hours, while awake, for the next 5 days. 08/10/20   Duanne Fichter, PA-C  dicyclomine (BENTYL) 20 MG tablet TAKE 1 TABLET BY MOUTH THREE TIMES A DAY AS NEEDED FOR MUSCLE SPASMS 12/05/19   [name], MD  diphenhydrAMINE (BENADRYL) 25 MG tablet Take 1 tablet (25 mg total) by mouth every 6 (six) hours. 09/12/16   Jola Schmidt, MD  ELIQUIS 5 MG TABS tablet TAKE 1 TABLET BY MOUTH TWICE A DAY 09/10/20   [name], MD  escitalopram (LEXAPRO) 20 MG tablet TAKE 1 TABLET BY MOUTH EVERY DAY 07/25/20   [name], MD  famotidine (PEPCID) 20 MG tablet TAKE 1 TABLET BY MOUTH EVERYDAY AT BEDTIME. 05/03/20   [name], MD  furosemide (LASIX) 20 MG tablet TAKE 1 TABLET (20 MG TOTAL) BY MOUTH DAILY AS NEEDED FOR EDEMA. 04/09/20   Einar Pheasant, MD  gabapentin (NEURONTIN) 600 MG tablet TAKE 1 TABLET IN THE AM AND 1.5 TABLETS AT BEDTIME 07/16/20   [name], MD  glucose blood test strip Check once daily, dispense per patient and insurance preference, dx code E11.9 12/20/19   [name], MD  isosorbide mononitrate (IMDUR) 30 MG 24 hr tablet Take 30 mg by mouth daily.    [provider]  JANUVIA 100 MG tablet TAKE 1 TABLET BY MOUTH EVERY DAY 07/30/20   [name], MD  JARDIANCE 25 MG TABS tablet TAKE 1 TABLET BY MOUTH EVERY DAY 07/04/20   [name], MD  KLOR-CON M20 20 MEQ tablet  TAKE 1 TABLET (20 MEQ TOTAL) BY MOUTH DAILY AS NEEDED (TAKE WITH LASIX). 04/09/20   Einar Pheasant, MD  levothyroxine  (SYNTHROID) 25 MCG tablet TAKE 1 TABLET BY MOUTH BEFORE BREAKFAST 2-3 HOURS BEFORE TAKING ANY OTHER MEDICATION OR SUPPLEMENT 03/08/20   [name], MD  lidocaine (LIDODERM) 5 % Place 1 patch onto the skin daily. Remove & Discard patch within 12 hours. 10/06/18   [name], MD  metroNIDAZOLE (FLAGYL) 500 MG tablet Take 1 tablet (500 mg total) by mouth 2 (two) times daily for 7 days. 09/06/20 09/13/20  Menshew, Dannielle Karvonen, PA-C  Multiple Vitamins-Minerals (SUPER THERA VITE M) TABS Take by mouth.    [provider]  nitroGLYCERIN (NITROSTAT) 0.4 MG SL tablet Place 1 tablet (0.4 mg total) under the tongue every 5 (five) minutes as needed for chest pain. 10/19/19   Burnell Blanks, MD  OLANZapine (ZYPREXA) 10 MG tablet TAKE 1 TABLET BY MOUTH EVERYDAY AT BEDTIME 09/10/20   [name], MD  ondansetron (ZOFRAN) 4 MG tablet TAKE 1 TABLET (4 MG TOTAL) BY MOUTH 2 (TWO) TIMES DAILY AS NEEDED FOR NAUSEA OR VOMITING. 09/10/20   [name], MD  pantoprazole (PROTONIX) 40 MG tablet TAKE 1 TABLET BY MOUTH EVERY DAY 09/03/20   [name], MD  PRALUENT 75 MG/ML SOAJ every 14 (fourteen) days. 07/11/20   [provider]  rosuvastatin (CRESTOR) 40 MG tablet TAKE 1 TABLET BY MOUTH EVERY DAY 09/03/20   [name], MD    Review of Systems  Constitutional: Negative.   HENT: Negative.   Eyes: Negative.   Respiratory: Negative.   Cardiovascular: Negative.   Gastrointestinal: Negative.   Genitourinary: Negative.   Musculoskeletal: Negative.   Skin: Negative.   Neurological: Negative.   Psychiatric/Behavioral: Negative.      Physical Exam BP 128/88   Ht 5\' 6"  (1.676 m)   Wt 226 lb (102.5 kg)   BMI 36.48 kg/m  No LMP recorded. Patient has had a hysterectomy. Physical Exam Constitutional:      General: She is not in acute distress.    Appearance: Normal appearance.  Genitourinary:     No lesions in the vagina.     Right Labia: No rash,  tenderness, lesions, skin changes or Bartholin's cyst.    Left Labia: No tenderness, lesions, skin changes or rash.       Vaginal cuff intact.    No vaginal discharge, erythema, tenderness, bleeding or ulceration.      Right Adnexa: not tender, not full and no mass present.    Left Adnexa: not tender, not full and no mass present.    Cervix is not absent.     Urethral meatus exam comments: See other description.    Bladder is not tender.   HENT:     Head: Normocephalic and atraumatic.  Eyes:     General: No scleral icterus.    Conjunctiva/sclera: Conjunctivae normal.  Neurological:     General: No focal deficit present.     Mental Status: She is alert and oriented to person, place, and time.     Cranial Nerves: No cranial nerve deficit.  Psychiatric:        Mood and Affect: Mood normal.        Behavior: Behavior normal.        Judgment: Judgment normal.     Female chaperone present for pelvic and breast  portions of the physical exam  Assessment: 65 y.o. [id number]  female here for  1. Inflammation of urethra      Plan: Problem List Items Addressed This Visit   None   Visit Diagnoses    Inflammation of urethra    -  Primary   Relevant Orders   Ambulatory referral to Urology     There is apparently edema of the urethra near the meatus and extending proximally. No clear lesion noted.  No firmness.  Will refer to Urology for further evaluation.  A total of 20 minutes were spent face-to-face with the patient as well as preparation, review, communication, and documentation during this encounter.    Prentice Docker, MD 09/10/2020 5:22 PM

## 2020-09-11 ENCOUNTER — Telehealth: Payer: Self-pay | Admitting: Family Medicine

## 2020-09-11 ENCOUNTER — Encounter: Payer: Self-pay | Admitting: Family Medicine

## 2020-09-11 NOTE — Telephone Encounter (Signed)
Patient has an appointment with Urology on 10/08/2020. She wanted to know if Dr. Caryl Bis could get her an earlier appointment, any where.Ivanna Kocak,cma

## 2020-09-11 NOTE — Telephone Encounter (Signed)
I spoke with the patient and she wanted to see the provider for a follow up on he bleeding and she is scheduled to see the provider on Friday.  Chee Dimon,cma

## 2020-09-11 NOTE — Telephone Encounter (Signed)
Patient has an appointment with Urology on 10/08/2020. She wanted to know if Dr. Caryl Bis could get her an earlier appointment, any where.

## 2020-09-11 NOTE — Telephone Encounter (Signed)
She should reach out to the referring provider to see if they can help with this. It is likely that the urologists office does not have any available appointments sooner.

## 2020-09-13 ENCOUNTER — Other Ambulatory Visit: Payer: Self-pay

## 2020-09-13 DIAGNOSIS — R1013 Epigastric pain: Secondary | ICD-10-CM

## 2020-09-14 ENCOUNTER — Other Ambulatory Visit: Payer: Self-pay

## 2020-09-14 ENCOUNTER — Encounter: Payer: Self-pay | Admitting: Family Medicine

## 2020-09-14 ENCOUNTER — Ambulatory Visit (INDEPENDENT_AMBULATORY_CARE_PROVIDER_SITE_OTHER): Payer: Medicare HMO | Admitting: Family Medicine

## 2020-09-14 DIAGNOSIS — F32A Depression, unspecified: Secondary | ICD-10-CM | POA: Diagnosis not present

## 2020-09-14 DIAGNOSIS — N644 Mastodynia: Secondary | ICD-10-CM

## 2020-09-14 DIAGNOSIS — F419 Anxiety disorder, unspecified: Secondary | ICD-10-CM

## 2020-09-14 DIAGNOSIS — R310 Gross hematuria: Secondary | ICD-10-CM | POA: Insufficient documentation

## 2020-09-14 MED ORDER — FAMOTIDINE 20 MG PO TABS
ORAL_TABLET | ORAL | 1 refills | Status: DC
Start: 2020-09-14 — End: 2021-04-01

## 2020-09-14 NOTE — Assessment & Plan Note (Signed)
Seems to be related to hematuria. Her urethra appears edematous. She notes no discomfort with urination. Blood was seen on her urinalysis in the ED. We will proceed with CT hematuria protocol and she will keep her appointment with urology so they can consider cystoscopy. She will contact us immediately if she has recurrent hematuria.

## 2020-09-14 NOTE — Assessment & Plan Note (Addendum)
I reminded the patient that she needs to see psychiatry given that she is on Zyprexa. She was given information for [facility]. I discussed that if she ever felt that her son was a threat to himself or others that she needs to call 911.

## 2020-09-14 NOTE — Patient Instructions (Signed)
Nice to see you. Please see the urologist. We will get a CT scan ordered. Please get a better fitting bra. Please go to [facility] for further evaluation of your anxiety and depression so that they can manage her Zyprexa.

## 2020-09-14 NOTE — Assessment & Plan Note (Signed)
Mammogram with benign findings. Prior work-up for other causes unremarkable. She will try to get a different bra to see if she needs additional support.

## 2020-09-14 NOTE — Progress Notes (Signed)
[name], MD Phone: [phone]  Valerie Vazquez is a 65 y.o. female who presents today for f/u.  Possible hematuria: Patient notes she was walking out of the house when she suddenly felt a squishy feeling.  She went back in and noted there was blood in her underwear with some small clots.  She went to the emergency room and notes they felt as though there may have been a polyp that was contributing though she followed up with her gynecologist and they did not see a polyp and felt as though it is related to her erythematous edematous urethra.  She has been referred to urology though the appointment is not until February.  She has had no fevers.  She notes slight pink spotting when she would wipe after urinating though that has resolved now as well.  Right breast pain: Patient underwent mammogram that was negative.  She notes the radiologist advised her that she needed to get a better fitting bra as that may be contributing to the discomfort she has been having. She has had an extensive work up with CT imaging and a stress test for her symptoms.   Anxiety/depression: Patient has these things are well controlled currently though she has been having some stressors at home.  Her son has been dealing with some things and has been drinking alcohol.  She does worry about him as he did have a suicide attempt but number of months ago where he was hospitalized.  She denies SI.  Social History   Tobacco Use  Smoking Status Former Smoker  . Packs/day: 1.50  . Years: 10.00  . Pack years: 15.00  . Types: Cigarettes  . Quit date: 04/12/1979  . Years since quitting: 41.4  Smokeless Tobacco Never Used    Current Outpatient Medications on File Prior to Visit  Medication Sig Dispense Refill  . acetaminophen (TYLENOL) 500 MG tablet Take 1 tablet (500 mg total) by mouth every 6 (six) hours as needed. 30 tablet 0  . baclofen (LIORESAL) 10 MG tablet TAKE 1 TABLET BY MOUTH THREE TIMES A DAY AS NEEDED  FOR MUSCLE SPASMS 60 tablet 1  . carvedilol (COREG) 3.125 MG tablet TAKE 1 TABLET (3.125 MG TOTAL) BY MOUTH 2 (TWO) TIMES DAILY WITH A MEAL. 180 tablet 3  . cetirizine (ZYRTEC) 10 MG tablet TAKE 1 TABLET BY MOUTH EVERY DAY 90 tablet 3  . ciprofloxacin (CILOXAN) 0.3 % ophthalmic solution Place 1 drop into both eyes every 2 (two) hours. Administer 1 drop, every 2 hours, while awake, for 2 days. Then 1 drop, every 4 hours, while awake, for the next 5 days. 5 mL 0  . dicyclomine (BENTYL) 20 MG tablet TAKE 1 TABLET BY MOUTH THREE TIMES A DAY AS NEEDED FOR MUSCLE SPASMS 270 tablet 0  . diphenhydrAMINE (BENADRYL) 25 MG tablet Take 1 tablet (25 mg total) by mouth every 6 (six) hours. 12 tablet 0  . ELIQUIS 5 MG TABS tablet TAKE 1 TABLET BY MOUTH TWICE A DAY 60 tablet 5  . escitalopram (LEXAPRO) 20 MG tablet TAKE 1 TABLET BY MOUTH EVERY DAY 90 tablet 1  . famotidine (PEPCID) 20 MG tablet TAKE 1 TABLET BY MOUTH EVERYDAY AT BEDTIME. 90 tablet 1  . furosemide (LASIX) 20 MG tablet TAKE 1 TABLET (20 MG TOTAL) BY MOUTH DAILY AS NEEDED FOR EDEMA. 90 tablet 1  . gabapentin (NEURONTIN) 600 MG tablet TAKE 1 TABLET IN THE AM AND 1.5 TABLETS AT BEDTIME 225 tablet 1  . glucose  blood test strip Check once daily, dispense per patient and insurance preference, dx code E11.9 100 each 12  . isosorbide mononitrate (IMDUR) 30 MG 24 hr tablet Take 30 mg by mouth daily.    Marland Kitchen JANUVIA 100 MG tablet TAKE 1 TABLET BY MOUTH EVERY DAY 90 tablet 1  . JARDIANCE 25 MG TABS tablet TAKE 1 TABLET BY MOUTH EVERY DAY 90 tablet 1  . KLOR-CON M20 20 MEQ tablet TAKE 1 TABLET (20 MEQ TOTAL) BY MOUTH DAILY AS NEEDED (TAKE WITH LASIX). 90 tablet 1  . levothyroxine (SYNTHROID) 25 MCG tablet TAKE 1 TABLET BY MOUTH BEFORE BREAKFAST 2-3 HOURS BEFORE TAKING ANY OTHER MEDICATION OR SUPPLEMENT 90 tablet 3  . lidocaine (LIDODERM) 5 % Place 1 patch onto the skin daily. Remove & Discard patch within 12 hours. 30 patch 0  . Multiple Vitamins-Minerals (SUPER  THERA VITE M) TABS Take by mouth.    . nitroGLYCERIN (NITROSTAT) 0.4 MG SL tablet Place 1 tablet (0.4 mg total) under the tongue every 5 (five) minutes as needed for chest pain. 25 tablet 4  . OLANZapine (ZYPREXA) 10 MG tablet TAKE 1 TABLET BY MOUTH EVERYDAY AT BEDTIME 90 tablet 0  . ondansetron (ZOFRAN) 4 MG tablet TAKE 1 TABLET (4 MG TOTAL) BY MOUTH 2 (TWO) TIMES DAILY AS NEEDED FOR NAUSEA OR VOMITING. 30 tablet 0  . pantoprazole (PROTONIX) 40 MG tablet TAKE 1 TABLET BY MOUTH EVERY DAY 90 tablet 1  . PRALUENT 75 MG/ML SOAJ every 14 (fourteen) days.    . rosuvastatin (CRESTOR) 40 MG tablet TAKE 1 TABLET BY MOUTH EVERY DAY 90 tablet 1   No current facility-administered medications on file prior to visit.     ROS see history of present illness  Objective  Physical Exam Vitals:   09/14/20 1449  BP: 100/60  Pulse: 83  Temp: 98.3 F (36.8 C)  SpO2: 95%    BP Readings from Last 3 Encounters:  09/14/20 100/60  09/10/20 128/88  09/06/20 (!) 147/77   Wt Readings from Last 3 Encounters:  09/14/20 224 lb 6.4 oz (101.8 kg)  09/10/20 226 lb (102.5 kg)  09/06/20 224 lb (101.6 kg)    Physical Exam Constitutional:      General: She is not in acute distress.    Appearance: She is not diaphoretic.  Cardiovascular:     Rate and Rhythm: Normal rate and regular rhythm.     Heart sounds: Normal heart sounds.  Pulmonary:     Effort: Pulmonary effort is normal.     Breath sounds: Normal breath sounds.  Genitourinary:    Comments: Fulton Mole, CMA served as chaperone, urethra is edematous and slightly erythematous, no firm areas, slight tenderness Musculoskeletal:        General: No edema.  Skin:    General: Skin is warm and dry.  Neurological:     Mental Status: She is alert.      Assessment/Plan: Please see individual problem list.  Problem List Items Addressed This Visit    Anxiety and depression    I reminded the patient that she needs to see psychiatry given that she is  on Zyprexa. She was given information for [facility]. I discussed that if she ever felt that her son was a threat to himself or others that she needs to call 911.      Breast pain    Mammogram with benign findings. Prior work-up for other causes unremarkable. She will try to get a different bra to see if  she needs additional support.      Gross hematuria    Seems to be related to hematuria. Her urethra appears edematous. She notes no discomfort with urination. Blood was seen on her urinalysis in the ED. We will proceed with CT hematuria protocol and she will keep her appointment with urology so they can consider cystoscopy. She will contact us immediately if she has recurrent hematuria.      Relevant Orders   CT HEMATURIA WORKUP        This visit occurred during the SARS-CoV-2 public health emergency.  Safety protocols were in place, including screening questions prior to the visit, additional usage of staff PPE, and extensive cleaning of exam room while observing appropriate contact time as indicated for disinfecting solutions.   I have spent 31 minutes in the care of this patient regarding history taking, documentation, review of ED note and gynecology visit, placing orders, discussion of plan with patient.   [name], MD [place]

## 2020-09-17 ENCOUNTER — Ambulatory Visit: Payer: Medicare HMO | Admitting: Urology

## 2020-09-19 ENCOUNTER — Other Ambulatory Visit: Payer: Self-pay

## 2020-09-19 ENCOUNTER — Ambulatory Visit
Admission: RE | Admit: 2020-09-19 | Discharge: 2020-09-19 | Disposition: A | Discharge status: Home / Self Care | Payer: Medicare HMO | Source: Ambulatory Visit | Attending: Family Medicine | Admitting: Family Medicine

## 2020-09-19 DIAGNOSIS — N83291 Other ovarian cyst, right side: Secondary | ICD-10-CM | POA: Diagnosis not present

## 2020-09-19 DIAGNOSIS — R310 Gross hematuria: Secondary | ICD-10-CM | POA: Insufficient documentation

## 2020-09-19 DIAGNOSIS — N83292 Other ovarian cyst, left side: Secondary | ICD-10-CM | POA: Diagnosis not present

## 2020-09-19 DIAGNOSIS — K6389 Other specified diseases of intestine: Secondary | ICD-10-CM | POA: Diagnosis not present

## 2020-09-19 MED ORDER — IOHEXOL 300 MG/ML  SOLN
125.0000 mL | Freq: Once | INTRAMUSCULAR | Status: AC | PRN
  Administered 2020-09-19: 125 mL via INTRAVENOUS

## 2020-09-20 ENCOUNTER — Ambulatory Visit: Payer: Medicare HMO | Admitting: Urology

## 2020-09-20 ENCOUNTER — Encounter: Payer: Self-pay | Admitting: Urology

## 2020-09-20 VITALS — BP 120/77 | HR 93 | Ht 66.0 in | Wt 224.0 lb

## 2020-09-20 DIAGNOSIS — N369 Urethral disorder, unspecified: Secondary | ICD-10-CM | POA: Diagnosis not present

## 2020-09-20 DIAGNOSIS — R32 Unspecified urinary incontinence: Secondary | ICD-10-CM | POA: Diagnosis not present

## 2020-09-20 LAB — MICROSCOPIC EXAMINATION

## 2020-09-20 LAB — URINALYSIS, COMPLETE
Bilirubin, UA: NEGATIVE
Ketones, UA: NEGATIVE
Leukocytes,UA: NEGATIVE
Nitrite, UA: NEGATIVE
Protein,UA: NEGATIVE
Specific Gravity, UA: 1.01 (ref 1.005–1.030)
Urobilinogen, Ur: 0.2 mg/dL (ref 0.2–1.0)
pH, UA: 5 (ref 5.0–7.5)

## 2020-09-20 NOTE — Progress Notes (Signed)
09/20/20 1:24 PM   Valerie Vazquez [birth year] [id number]  CC: Urethral lesion, gross hematuria  HPI: I saw Valerie Vazquez today for evaluation of a urethral lesion and gross hematuria.  She is a 64 year old female who reports acute onset of blood and clots in her underwear on January 6-8th.  This was not associated with any significant pain aside from some very mild dysuria.  She was seen in the ER, by her PCP, as well as by OB/GYN.  Dr. Glennon Mac in GYN felt her urethra was edematous and abnormal and she was referred to urology.  I reviewed the outside notes from Dr. Glennon Mac as well as her PCP.  A CT urogram was also ordered by her PCP and showed no significant abnormalities, but bladder and pelvis evaluation was limited secondary to hip prosthesis.  She has a minimal smoking history of less than 5 years.  Her gross hematuria has resolved and she really denies any significant urinary complaints today.  She had a hysterectomy in the 1980s.  She denies any significant incontinence or other urinary symptoms aside from rare urgency.  Urinalysis on 09/06/2020 with greater than 500 glucose, 6-10 RBCs, 11-20 WBCs, rare bacteria, was not sent for culture.  Urinalysis today with 0-5 WBCs, 0-2 RBCs, moderate bacteria, nitrite negative, no leukocytes.   PMH: Past Medical History:  Diagnosis Date  . Acute right-sided low back pain with right-sided sciatica 10/23/2017  . Anemia   . Bilateral renal cysts   . CAD (coronary artery disease) 09/28/2016  . Chest pain 10/14/2015  . Chronic back pain    "upper and lower" (09/19/2014)  . Chronic shoulder pain (Location of Primary Source of Pain) (Left) 01/29/2016  . Colitis 04/08/2016  . Coronary vasospasm ([place]) 12/09/2014  . Depression    "major" (09/19/2014)  . DVT (deep venous thrombosis) ([place])   . Esophageal spasm   . Essential hypertension 10/28/2013  . Gallstones   . Gastritis   . Gastroenteritis   . GERD (gastroesophageal reflux disease)   . Headache     "couple /month lately" (09/19/2014)  . Heart disease 12/09/2016  . High blood pressure   . High cholesterol   . History of blood transfusion 1985   related to hysterectomy  . History of nuclear stress test    Myoview 1/19:  EF 65, no ischemia or scar  . History of pulmonary embolus (PE) 09/19/2014  . Hx of cervical spine surgery 01/29/2016  . 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 Rule out Paget's disease of the bone with blood work.   . Hyperlipidemia 10/28/2013  . Hypertension   . IBS (irritable bowel syndrome)   . Lumbar central spinal stenosis (moderate at L4-5; mild at L2-3 and L3-4) 01/29/2016  . Migraine    "a few times/yr" (09/19/2014)  . Myocardial infarction ([place]) 10/2010 X 3   "while hospitalized"  . Neck pain 01/29/2016  . Osteoarthritis    "qwhere; mostly around my joints" (09/19/2014)  . Pleural effusion, bilateral 07/31/2015  . Pneumonia 04/2014  . PTSD (post-traumatic stress disorder)   . Pulmonary embolism ([place]) 04/2001; 09/19/2014   "after gallbladder OR; "  . Schizoaffective disorder ([place])   . Schizoaffective disorder, depressive type ([place]) 08/28/2016  . Sleep apnea    "mild" (09/19/2014)  . Snoring    a. sleep study 5/16: No OSA  . SOB (shortness of breath) 07/31/2015  . Spinal stenosis   . Spondylosis   . Suicide attempt ([place]) 08/27/2016  .  Type II diabetes mellitus ([place])    "dx'd in 2007; lost weight; no RX for ~ 4 yr now" (09/19/2014)  . Wheezing 02/25/2017    Surgical History: Past Surgical History:  Procedure Laterality Date  . ABDOMINAL HYSTERECTOMY  1985  . ANTERIOR CERVICAL DECOMP/DISCECTOMY FUSION  10/2012   in [place] C5-C7   . APPENDECTOMY  1985  . BACK SURGERY    . BREAST CYST EXCISION Right   . BREAST EXCISIONAL BIOPSY Right YRS AGO   NEG  . CARDIAC CATHETERIZATION   2000's; 2009  . CHOLECYSTECTOMY  2002  . COLONOSCOPY WITH PROPOFOL N/A 12/12/2016   Procedure: COLONOSCOPY WITH PROPOFOL;  Surgeon: [name], MD;   Location: [facility] ENDOSCOPY;  Service: Endoscopy;  Laterality: N/A;  . COLONOSCOPY WITH PROPOFOL N/A 02/17/2017   Procedure: COLONOSCOPY WITH PROPOFOL;  Surgeon: [name], MD;  Location: [facility] ENDOSCOPY;  Service: Gastroenterology;  Laterality: N/A;  . ESOPHAGOGASTRODUODENOSCOPY (EGD) WITH PROPOFOL N/A 02/17/2017   Procedure: ESOPHAGOGASTRODUODENOSCOPY (EGD) WITH PROPOFOL;  Surgeon: [name], MD;  Location: [facility] ENDOSCOPY;  Service: Gastroenterology;  Laterality: N/A;  . ESOPHAGOGASTRODUODENOSCOPY (EGD) WITH PROPOFOL N/A 03/07/2019   Procedure: ESOPHAGOGASTRODUODENOSCOPY (EGD) WITH PROPOFOL;  Surgeon: [name], MD;  Location: [facility] ENDOSCOPY;  Service: Gastroenterology;  Laterality: N/A;  . EXCISION/RELEASE BURSA HIP Right 12/1984  . HERNIA REPAIR  1985  . LEFT HEART CATHETERIZATION WITH CORONARY ANGIOGRAM N/A 12/09/2014   Procedure: LEFT HEART CATHETERIZATION WITH CORONARY ANGIOGRAM;  Surgeon: Burnell Blanks, MD;  Location[facility] CATH LAB;  Service: Cardiovascular;  Laterality: N/A;  . TONSILLECTOMY AND ADENOIDECTOMY  ~ 1968  . TOTAL HIP ARTHROPLASTY Left 04-06-2014  . UMBILICAL HERNIA REPAIR  1985  . VENA CAVA FILTER PLACEMENT  03/2014    Family History: Family History  Problem Relation Age of Onset  . Stroke Mother        Deceased  . Lung cancer Father        Deceased  . Healthy Daughter   . Healthy Son        x 2  . Other Son        Suicide  . Cancer Brother   . Heart attack Neg Hx     Social History:  reports that she quit smoking about 41 years ago. Her smoking use included cigarettes. She has a 15.00 pack-year smoking history. She has never used smokeless tobacco. She reports that she does not drink alcohol and does not use drugs.  Physical Exam: BP 120/77 (BP Location: Left Arm, Patient Position: Sitting, Cuff Size: Large)   Pulse 93   Ht 5\' 6"  (1.676 m)   Wt 224 lb (101.6 kg)   BMI 36.15 kg/m    Constitutional:  Alert and oriented, No acute  distress. Cardiovascular: No clubbing, cyanosis, or edema. Respiratory: Normal respiratory effort, no increased work of breathing. GI: Abdomen is soft, nontender, nondistended, no abdominal masses GU: Pelvic exam performed with Valerie Vazquez as chaperone.  Mucosa healthy appearing, spherical 1 cm pale mobile and nontender lesion at the inferior aspect of the urethra, photo uploaded to epic under media tab.  No abnormal drainage or blood.  Laboratory Data: Reviewed, see HPI  Pertinent Imaging: I have personally viewed and interpreted the CT urogram 09/19/2020 that shows no evidence of stone disease, hydronephrosis, or other urologic abnormalities.  Bladder evaluation significantly limited by streak artifact from left hip prosthesis.  Assessment & Plan:   65 year old female with an episode of suspected gross hematuria with significant clots that lasted a few  days in early January, and abnormal physical exam with a 1 cm spherical lesion at the inferior aspect of the urethra.  This is nontender, and she is essentially asymptomatic at this time.  Urinalysis today is also benign.  A photo was uploaded to epic under the media tab.  I reviewed her case and the images with Dr. Matilde Sprang who is our female urology expert, and he agreed to see her early next week for physical exam and potentially cystoscopy.  We reviewed possible etiologies including periurethral cyst, diverticulum, urethral caruncle, and even malignancy.  We reviewed treatment strategies can range from observation, topical estrogen cream, biopsy, or even excision.  Follow-up Monday with Dr. Matilde Sprang for pelvic exam and consider cystoscopy  Nickolas Madrid, MD 09/20/2020  [facility] [address] [place], [place] [address] [phone]

## 2020-09-20 NOTE — Patient Instructions (Signed)
You have a lesion on your urethra, the small tube that you pee through.  This will need further evaluation with an expert in female urology, and we will set up this visit for you next week.  We likely will need to perform a cystoscopy to look in your bladder as well at that time.   Cystoscopy Cystoscopy is a procedure that is used to help diagnose and sometimes treat conditions that affect the lower urinary tract. The lower urinary tract includes the bladder and the urethra. The urethra is the tube that drains urine from the bladder. Cystoscopy is done using a thin, tube-shaped instrument with a light and camera at the end (cystoscope). The cystoscope may be hard or flexible, depending on the goal of the procedure. The cystoscope is inserted through the urethra, into the bladder. Cystoscopy may be recommended if you have:  Urinary tract infections that keep coming back.  Blood in the urine (hematuria).  An inability to control when you urinate (urinary incontinence) or an overactive bladder.  Unusual cells found in a urine sample.  A blockage in the urethra, such as a urinary stone.  Painful urination.  An abnormality in the bladder found during an intravenous pyelogram (IVP) or CT scan. Cystoscopy may also be done to remove a sample of tissue to be examined under a microscope (biopsy). Tell a health care provider about:  Any allergies you have.  All medicines you are taking, including vitamins, herbs, eye drops, creams, and over-the-counter medicines.  Any problems you or family members have had with anesthetic medicines.  Any blood disorders you have.  Any surgeries you have had.  Any medical conditions you have.  Whether you are pregnant or may be pregnant. What are the risks? Generally, this is a safe procedure. However, problems may occur, including:  Infection.  Bleeding.  Allergic reactions to medicines.  Damage to other structures or organs. What happens before  the procedure? Medicines Ask your health care provider about:  Changing or stopping your regular medicines. This is especially important if you are taking diabetes medicines or blood thinners.  Taking medicines such as aspirin and ibuprofen. These medicines can thin your blood. Do not take these medicines unless your health care provider tells you to take them.  Taking over-the-counter medicines, vitamins, herbs, and supplements. Tests You may have an exam or testing, such as:  X-rays of the bladder, urethra, or kidneys.  CT scan of the abdomen or pelvis.  Urine tests to check for signs of infection. General instructions  Follow instructions from your health care provider about eating or drinking restrictions.  Ask your health care provider what steps will be taken to help prevent infection. These steps may include: ? Washing skin with a germ-killing soap. ? Taking antibiotic medicine.  Plan to have a responsible adult take you home from the hospital or clinic. What happens during the procedure?  You will be given one or more of the following: ? A medicine to help you relax (sedative). ? A medicine to numb the area (local anesthetic).  The area around the opening of your urethra will be cleaned.  The cystoscope will be passed through your urethra into your bladder.  Germ-free (sterile) fluid will flow through the cystoscope to fill your bladder. The fluid will stretch your bladder so that your health care provider can clearly examine your bladder walls.  Your doctor will look at the urethra and bladder. Your doctor may take a biopsy or remove stones.  The cystoscope will be removed, and your bladder will be emptied. The procedure may vary among health care providers and hospitals.   What can I expect after the procedure? After the procedure, it is common to have:  Some soreness or pain in your abdomen and urethra.  Urinary symptoms. These include: ? Mild pain or  burning when you urinate. Pain should stop within a few minutes after you urinate. This may last for up to 1 week. ? A small amount of blood in your urine for several days. ? Feeling like you need to urinate but producing only a small amount of urine. Follow these instructions at home: Medicines  Take over-the-counter and prescription medicines only as told by your health care provider.  If you were prescribed an antibiotic medicine, take it as told by your health care provider. Do not stop taking the antibiotic even if you start to feel better. General instructions  Return to your normal activities as told by your health care provider. Ask your health care provider what activities are safe for you.  If you were given a sedative during the procedure, it can affect you for several hours. Do not drive or operate machinery until your health care provider says that it is safe.  Watch for any blood in your urine. If the amount of blood in your urine increases, call your health care provider.  Follow instructions from your health care provider about eating or drinking restrictions.  If a tissue sample was removed for testing (biopsy) during your procedure, it is up to you to get your test results. Ask your health care provider, or the department that is doing the test, when your results will be ready.  Drink enough fluid to keep your urine pale yellow.  Keep all follow-up visits. This is important. Contact a health care provider if:  You have pain that gets worse or does not get better with medicine, especially pain when you urinate.  You have trouble urinating.  You have more blood in your urine. Get help right away if:  You have blood clots in your urine.  You have abdominal pain.  You have a fever or chills.  You are unable to urinate. Summary  Cystoscopy is a procedure that is used to help diagnose and sometimes treat conditions that affect the lower urinary  tract.  Cystoscopy is done using a thin, tube-shaped instrument with a light and camera at the end.  After the procedure, it is common to have some soreness or pain in your abdomen and urethra.  Watch for any blood in your urine. If the amount of blood in your urine increases, call your health care provider.  If you were prescribed an antibiotic medicine, take it as told by your health care provider. Do not stop taking the antibiotic even if you start to feel better. This information is not intended to replace advice given to you by your health care provider. Make sure you discuss any questions you have with your health care provider. Document Revised: 03/30/2020 Document Reviewed: 03/30/2020 Elsevier Patient Education  [place].

## 2020-09-24 ENCOUNTER — Other Ambulatory Visit: Payer: Self-pay

## 2020-09-24 ENCOUNTER — Encounter: Payer: Self-pay | Admitting: Urology

## 2020-09-24 ENCOUNTER — Ambulatory Visit: Payer: Medicare HMO | Admitting: Urology

## 2020-09-24 VITALS — BP 134/85 | HR 85

## 2020-09-24 DIAGNOSIS — R310 Gross hematuria: Secondary | ICD-10-CM | POA: Diagnosis not present

## 2020-09-24 MED ORDER — ESTRADIOL 0.1 MG/GM VA CREA: TOPICAL_CREAM | VAGINAL | 12 refills | Status: DC

## 2020-09-24 NOTE — Progress Notes (Signed)
09/24/2020 10:45 AM   Valerie Vazquez December 18, 1955 [id number]  Referring provider: [name], MD [address] [place],  [place] [address]  No chief complaint on file.   HPI: Dr Bethann Goo: Patient had gross hematuria with minimal dysuria.  CT scan normal.  Hematuria has settled down.  Patient noted to have 1 cm mobile nontender lesion inferior aspect of the urethra and I reviewed the photo  Day Frequency stable.  No blood in urine subjectively.  No burning. No recent urine culture.  Hematuria CT scan was within normal limit but had streak artifact from left hip prosthesis.  Bowel changes noted  On pelvic examination the patient appeared to have a 1 cm swelling in the lower aspect of the urethra that was quite smooth and soft.  It looks like urethral mucosal prolapse.  Having said that it was fairly discrete and smooth but I thought it was prolapse likely extending from about 3:00 to 9:00 but mainly inferiorly.  You could see the urethral meatus looks small above it.  Urethra itself and bladder was well supported.  She was a bit tender at the level of the introitus  She quit smoking 30 to 40 years ago  Picture drawn.      PMH: Past Medical History:  Diagnosis Date  . Acute right-sided low back pain with right-sided sciatica 10/23/2017  . Anemia   . Bilateral renal cysts   . CAD (coronary artery disease) 09/28/2016  . Chest pain 10/14/2015  . Chronic back pain    "upper and lower" (09/19/2014)  . Chronic shoulder pain (Location of Primary Source of Pain) (Left) 01/29/2016  . Colitis 04/08/2016  . Coronary vasospasm ([place]) 12/09/2014  . Depression    "major" (09/19/2014)  . DVT (deep venous thrombosis) ([place])   . Esophageal spasm   . Essential hypertension 10/28/2013  . Gallstones   . Gastritis   . Gastroenteritis   . GERD (gastroesophageal reflux disease)   . Headache    "couple /month lately" (09/19/2014)  . Heart disease 12/09/2016  . High blood pressure   . High  cholesterol   . History of blood transfusion 1985   related to hysterectomy  . History of nuclear stress test    Myoview 1/19:  EF 65, no ischemia or scar  . History of pulmonary embolus (PE) 09/19/2014  . Hx of cervical spine surgery 01/29/2016  . 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 Rule out Paget's disease of the bone with blood work.   . Hyperlipidemia 10/28/2013  . Hypertension   . IBS (irritable bowel syndrome)   . Lumbar central spinal stenosis (moderate at L4-5; mild at L2-3 and L3-4) 01/29/2016  . Migraine    "a few times/yr" (09/19/2014)  . Myocardial infarction ([place]) 10/2010 X 3   "while hospitalized"  . Neck pain 01/29/2016  . Osteoarthritis    "qwhere; mostly around my joints" (09/19/2014)  . Pleural effusion, bilateral 07/31/2015  . Pneumonia 04/2014  . PTSD (post-traumatic stress disorder)   . Pulmonary embolism ([place]) 04/2001; 09/19/2014   "after gallbladder OR; "  . Schizoaffective disorder ([place])   . Schizoaffective disorder, depressive type ([place]) 08/28/2016  . Sleep apnea    "mild" (09/19/2014)  . Snoring    a. sleep study 5/16: No OSA  . SOB (shortness of breath) 07/31/2015  . Spinal stenosis   . Spondylosis   . Suicide attempt ([place]) 08/27/2016  . Type II diabetes mellitus ([place])    "  dx'd in 2007; lost weight; no RX for ~ 4 yr now" (09/19/2014)  . Wheezing 02/25/2017    Surgical History: Past Surgical History:  Procedure Laterality Date  . ABDOMINAL HYSTERECTOMY  1985  . ANTERIOR CERVICAL DECOMP/DISCECTOMY FUSION  10/2012   in [place] C5-C7   . APPENDECTOMY  1985  . BACK SURGERY    . BREAST CYST EXCISION Right   . BREAST EXCISIONAL BIOPSY Right YRS AGO   NEG  . CARDIAC CATHETERIZATION   2000's; 2009  . CHOLECYSTECTOMY  2002  . COLONOSCOPY WITH PROPOFOL N/A 12/12/2016   Procedure: COLONOSCOPY WITH PROPOFOL;  Surgeon: [name], MD;  Location: [facility] ENDOSCOPY;  Service: Endoscopy;  Laterality: N/A;  . COLONOSCOPY WITH PROPOFOL N/A  02/17/2017   Procedure: COLONOSCOPY WITH PROPOFOL;  Surgeon: [name], MD;  Location: [facility] ENDOSCOPY;  Service: Gastroenterology;  Laterality: N/A;  . ESOPHAGOGASTRODUODENOSCOPY (EGD) WITH PROPOFOL N/A 02/17/2017   Procedure: ESOPHAGOGASTRODUODENOSCOPY (EGD) WITH PROPOFOL;  Surgeon: [name], MD;  Location: [facility] ENDOSCOPY;  Service: Gastroenterology;  Laterality: N/A;  . ESOPHAGOGASTRODUODENOSCOPY (EGD) WITH PROPOFOL N/A 03/07/2019   Procedure: ESOPHAGOGASTRODUODENOSCOPY (EGD) WITH PROPOFOL;  Surgeon: [name], MD;  Location: [facility] ENDOSCOPY;  Service: Gastroenterology;  Laterality: N/A;  . EXCISION/RELEASE BURSA HIP Right 12/1984  . HERNIA REPAIR  1985  . LEFT HEART CATHETERIZATION WITH CORONARY ANGIOGRAM N/A 12/09/2014   Procedure: LEFT HEART CATHETERIZATION WITH CORONARY ANGIOGRAM;  Surgeon: Burnell Blanks, MD;  Location: [facility] CATH LAB;  Service: Cardiovascular;  Laterality: N/A;  . TONSILLECTOMY AND ADENOIDECTOMY  ~ 1968  . TOTAL HIP ARTHROPLASTY Left 04-06-2014  . UMBILICAL HERNIA REPAIR  1985  . VENA CAVA FILTER PLACEMENT  03/2014    Home Medications:  Allergies as of 09/24/2020      Reactions   Abilify [aripiprazole] Other (See Comments)   Jerking movement, slow motor skills   Augmentin [amoxicillin-pot Clavulanate] Diarrhea, Nausea And Vomiting   Bactrim [sulfamethoxazole-trimethoprim] Diarrhea   Ceftin [cefuroxime Axetil] Diarrhea   Coconut Oil    Rash    Diclofenac Other (See Comments)    Muscle Pain   Dye Fdc Red [red Dye]    "hair dye"   Indocin [indomethacin] Other (See Comments)   Migraines    Linaclotide Diarrhea   Lisinopril Diarrhea, Other (See Comments)   Other reaction(s): Dizziness, Vomiting   Morphine Other (See Comments)   Chest Tightness   Morphine And Related Other (See Comments)   Chest tightness    Simvastatin Other (See Comments)   Muscle Pain   Sulfa Antibiotics Diarrhea   Sulfamethoxazole-trimethoprim Diarrhea   Wellbutrin [bupropion] Other  (See Comments)   Jerking movement Jerking movement Slurred speech   Zetia [ezetimibe]    Diarrhea   Quetiapine Fumarate Palpitations   Quetiapine Fumarate Palpitations      Medication List       Accurate as of September 24, 2020 10:45 AM. If you have any questions, ask your nurse or doctor.        acetaminophen 500 MG tablet Commonly known as: TYLENOL Take 1 tablet (500 mg total) by mouth every 6 (six) hours as needed.   baclofen 10 MG tablet Commonly known as: LIORESAL TAKE 1 TABLET BY MOUTH THREE TIMES A DAY AS NEEDED FOR MUSCLE SPASMS   carvedilol 3.125 MG tablet Commonly known as: COREG TAKE 1 TABLET (3.125 MG TOTAL) BY MOUTH 2 (TWO) TIMES DAILY WITH A MEAL.   cetirizine 10 MG tablet Commonly known as: ZYRTEC TAKE 1 TABLET BY MOUTH EVERY DAY  ciprofloxacin 0.3 % ophthalmic solution Commonly known as: CILOXAN Place 1 drop into both eyes every 2 (two) hours. Administer 1 drop, every 2 hours, while awake, for 2 days. Then 1 drop, every 4 hours, while awake, for the next 5 days.   dicyclomine 20 MG tablet Commonly known as: BENTYL TAKE 1 TABLET BY MOUTH THREE TIMES A DAY AS NEEDED FOR MUSCLE SPASMS   diphenhydrAMINE 25 MG tablet Commonly known as: BENADRYL Take 1 tablet (25 mg total) by mouth every 6 (six) hours.   Eliquis 5 MG Tabs tablet Generic drug: apixaban TAKE 1 TABLET BY MOUTH TWICE A DAY   escitalopram 20 MG tablet Commonly known as: LEXAPRO TAKE 1 TABLET BY MOUTH EVERY DAY   famotidine 20 MG tablet Commonly known as: PEPCID TAKE 1 TABLET BY MOUTH EVERYDAY AT BEDTIME.   furosemide 20 MG tablet Commonly known as: LASIX TAKE 1 TABLET (20 MG TOTAL) BY MOUTH DAILY AS NEEDED FOR EDEMA.   gabapentin 600 MG tablet Commonly known as: NEURONTIN TAKE 1 TABLET IN THE AM AND 1.5 TABLETS AT BEDTIME   glucose blood test strip Check once daily, dispense per patient and insurance preference, dx code E11.9   isosorbide mononitrate 30 MG 24 hr  tablet Commonly known as: IMDUR Take 30 mg by mouth daily.   Januvia 100 MG tablet Generic drug: sitaGLIPtin TAKE 1 TABLET BY MOUTH EVERY DAY   Jardiance 25 MG Tabs tablet Generic drug: empagliflozin TAKE 1 TABLET BY MOUTH EVERY DAY   Klor-Con M20 20 MEQ tablet Generic drug: potassium chloride SA TAKE 1 TABLET (20 MEQ TOTAL) BY MOUTH DAILY AS NEEDED (TAKE WITH LASIX).   levothyroxine 25 MCG tablet Commonly known as: SYNTHROID TAKE 1 TABLET BY MOUTH BEFORE BREAKFAST 2-3 HOURS BEFORE TAKING ANY OTHER MEDICATION OR SUPPLEMENT   lidocaine 5 % Commonly known as: Lidoderm Place 1 patch onto the skin daily. Remove & Discard patch within 12 hours.   nitroGLYCERIN 0.4 MG SL tablet Commonly known as: NITROSTAT Place 1 tablet (0.4 mg total) under the tongue every 5 (five) minutes as needed for chest pain.   OLANZapine 10 MG tablet Commonly known as: ZYPREXA TAKE 1 TABLET BY MOUTH EVERYDAY AT BEDTIME   ondansetron 4 MG tablet Commonly known as: ZOFRAN TAKE 1 TABLET (4 MG TOTAL) BY MOUTH 2 (TWO) TIMES DAILY AS NEEDED FOR NAUSEA OR VOMITING.   pantoprazole 40 MG tablet Commonly known as: PROTONIX TAKE 1 TABLET BY MOUTH EVERY DAY   Praluent 75 MG/ML [place] Generic drug: Alirocumab every 14 (fourteen) days.   rosuvastatin 40 MG tablet Commonly known as: CRESTOR TAKE 1 TABLET BY MOUTH EVERY DAY   Super Thera Vite M Tabs Take by mouth.       Allergies:  Allergies  Allergen Reactions  . Abilify [Aripiprazole] Other (See Comments)    Jerking movement, slow motor skills  . Augmentin [Amoxicillin-Pot Clavulanate] Diarrhea and Nausea And Vomiting  . Bactrim [Sulfamethoxazole-Trimethoprim] Diarrhea  . Ceftin [Cefuroxime Axetil] Diarrhea  . Coconut Oil     Rash   . Diclofenac Other (See Comments)     Muscle Pain  . Dye Fdc Red [Red Dye]     "hair dye"  . Indocin [Indomethacin] Other (See Comments)    Migraines   . Linaclotide Diarrhea  . Lisinopril Diarrhea and Other (See  Comments)    Other reaction(s): Dizziness, Vomiting  . Morphine Other (See Comments)    Chest Tightness  . Morphine And Related Other (See Comments)  Chest tightness   . Simvastatin Other (See Comments)    Muscle Pain  . Sulfa Antibiotics Diarrhea  . Sulfamethoxazole-Trimethoprim Diarrhea  . Wellbutrin [Bupropion] Other (See Comments)    Jerking movement Jerking movement Slurred speech  . Zetia [Ezetimibe]     Diarrhea   . Quetiapine Fumarate Palpitations  . Quetiapine Fumarate Palpitations    Family History: Family History  Problem Relation Age of Onset  . Stroke Mother        Deceased  . Lung cancer Father        Deceased  . Healthy Daughter   . Healthy Son        x 2  . Other Son        Suicide  . Cancer Brother   . Heart attack Neg Hx     Social History:  reports that she quit smoking about 41 years ago. Her smoking use included cigarettes. She has a 15.00 pack-year smoking history. She has never used smokeless tobacco. She reports that she does not drink alcohol and does not use drugs.  ROS:                                        Physical Exam: BP 134/85   Pulse 85   Constitutional:  Alert and oriented, No acute distress.  Laboratory Data: Lab Results  Component Value Date   WBC 8.9 09/06/2020   HGB 12.7 09/06/2020   HCT 36.6 09/06/2020   MCV 92.9 09/06/2020   PLT 334 09/06/2020    Lab Results  Component Value Date   CREATININE 0.83 09/06/2020    No results found for: PSA  No results found for: TESTOSTERONE  Lab Results  Component Value Date   HGBA1C 7.4 (H) 01/27/2020    Urinalysis    Component Value Date/Time   COLORURINE YELLOW (A) 09/06/2020 1600   APPEARANCEUR Hazy (A) 09/20/2020 1328   LABSPEC 1.030 09/06/2020 1600   LABSPEC 1.021 05/27/2014 2137   PHURINE 5.0 09/06/2020 1600   GLUCOSEU 2+ (A) 09/20/2020 1328   GLUCOSEU NEGATIVE 08/21/2017 1118   HGBUR MODERATE (A) 09/06/2020 1600   BILIRUBINUR  Negative 09/20/2020 1328   BILIRUBINUR Negative 05/27/2014 2137   KETONESUR NEGATIVE 09/06/2020 1600   PROTEINUR Negative 09/20/2020 1328   PROTEINUR NEGATIVE 09/06/2020 1600   UROBILINOGEN 1.0 10/23/2017 1027   UROBILINOGEN 1.0 08/21/2017 1118   NITRITE Negative 09/20/2020 1328   NITRITE NEGATIVE 09/06/2020 1600   LEUKOCYTESUR Negative 09/20/2020 1328   LEUKOCYTESUR NEGATIVE 09/06/2020 1600   LEUKOCYTESUR Negative 05/27/2014 2137    Pertinent Imaging:   Assessment & Plan: I would like to place her on some local estrogen cream.  She may have bled into some urethral mucosal prolapse.  Time alone this may regress.  I like to see her back in about 6 weeks to reassess the lesion and perform cystoscopy.  With her being a bit tender this may be challenging but for safety reasons should be performed.  We will proceed accordingly.  Local estrogen cream apply 3 nights a week.  She is on blood thinners  There are no diagnoses linked to this encounter.  No follow-ups on file.  Reece Packer, MD  [facility] [address] [place], [place] [address] [phone]

## 2020-09-29 ENCOUNTER — Other Ambulatory Visit: Payer: Self-pay | Admitting: Family Medicine

## 2020-09-29 ENCOUNTER — Other Ambulatory Visit: Payer: Self-pay | Admitting: Internal Medicine

## 2020-09-29 DIAGNOSIS — R110 Nausea: Secondary | ICD-10-CM

## 2020-10-04 ENCOUNTER — Other Ambulatory Visit: Payer: Self-pay | Admitting: Internal Medicine

## 2020-10-08 ENCOUNTER — Ambulatory Visit: Payer: Medicare HMO | Admitting: Urology

## 2020-10-13 ENCOUNTER — Other Ambulatory Visit: Payer: Self-pay | Admitting: Family Medicine

## 2020-10-13 DIAGNOSIS — R110 Nausea: Secondary | ICD-10-CM

## 2020-11-05 ENCOUNTER — Encounter: Payer: Self-pay | Admitting: Urology

## 2020-11-05 ENCOUNTER — Ambulatory Visit: Payer: Medicare HMO | Admitting: Urology

## 2020-11-05 ENCOUNTER — Other Ambulatory Visit: Payer: Self-pay

## 2020-11-05 VITALS — BP 136/84 | HR 76 | Ht 66.0 in | Wt 222.0 lb

## 2020-11-05 DIAGNOSIS — R310 Gross hematuria: Secondary | ICD-10-CM | POA: Diagnosis not present

## 2020-11-05 NOTE — Addendum Note (Signed)
Addended by: Evelina Bucy on: 11/05/2020 10:31 AM   Modules accepted: Orders

## 2020-11-05 NOTE — Progress Notes (Signed)
11/05/2020 9:33 AM   Valerie Vazquez 1955/12/31 [id number]  Referring provider: [name], MD [address] [place],  [place] [address]  Chief Complaint  Patient presents with  . Cysto    HPI: Dr Bethann Goo: Patient had gross hematuria with minimal dysuria.  CT scan normal.  Hematuria has settled down.  Patient noted to have 1 cm mobile nontender lesion inferior aspect of the urethra and I reviewed the photo  Day Frequency stable.  No blood in urine subjectively.  No burning. No recent urine culture.  Hematuria CT scan was within normal limit but had streak artifact from left hip prosthesis.  Bowel changes noted  On pelvic examination the patient appeared to have a 1 cm swelling in the lower aspect of the urethra that was quite smooth and soft.  It looks like urethral mucosal prolapse.  Having said that it was fairly discrete and smooth but I thought it was prolapse likely extending from about 3:00 to 9:00 but mainly inferiorly.  You could see the urethral meatus looks small above it.  Urethra itself and bladder was well supported.  She was a bit tender at the level of the introitus  She quit smoking 30 to 40 years ago   would like to place her on some local estrogen cream.  She may have bled into some urethral mucosal prolapse.  Time alone this may regress.  I like to see her back in about 6 weeks to reassess the lesion and perform cystoscopy.  With her being a bit tender this may be challenging but for safety reasons should be performed.  We will proceed accordingly.  Local estrogen cream apply 3 nights a week.  She is on blood thinners  TOday Frequency stable.  No hematuria.  Using estrogen cream On repeat pelvic examination patient had a smooth less than 1 cm protrusion at the inferior aspect of the urethral meatus that looked like urethral mucosal prolapse.  There was no erythema or mass-effect.  It was nice and smooth and you could see the urothelium Cystoscopy:  Patient underwent flexible cystoscopy.  Bladder mucosa and trigone were normal.  No foreign body.  No carcinoma.  Urethra was nice and long and completely normal.    PMH: Past Medical History:  Diagnosis Date  . Acute right-sided low back pain with right-sided sciatica 10/23/2017  . Anemia   . Bilateral renal cysts   . CAD (coronary artery disease) 09/28/2016  . Chest pain 10/14/2015  . Chronic back pain    "upper and lower" (09/19/2014)  . Chronic shoulder pain (Location of Primary Source of Pain) (Left) 01/29/2016  . Colitis 04/08/2016  . Coronary vasospasm ([place]) 12/09/2014  . Depression    "major" (09/19/2014)  . DVT (deep venous thrombosis) ([place])   . Esophageal spasm   . Essential hypertension 10/28/2013  . Gallstones   . Gastritis   . Gastroenteritis   . GERD (gastroesophageal reflux disease)   . Headache    "couple /month lately" (09/19/2014)  . Heart disease 12/09/2016  . High blood pressure   . High cholesterol   . History of blood transfusion 1985   related to hysterectomy  . History of nuclear stress test    Myoview 1/19:  EF 65, no ischemia or scar  . History of pulmonary embolus (PE) 09/19/2014  . Hx of cervical spine surgery 01/29/2016  . 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 Rule out Paget's disease of the bone with  blood work.   . Hyperlipidemia 10/28/2013  . Hypertension   . IBS (irritable bowel syndrome)   . Lumbar central spinal stenosis (moderate at L4-5; mild at L2-3 and L3-4) 01/29/2016  . Migraine    "a few times/yr" (09/19/2014)  . Myocardial infarction ([place]) 10/2010 X 3   "while hospitalized"  . Neck pain 01/29/2016  . Osteoarthritis    "qwhere; mostly around my joints" (09/19/2014)  . Pleural effusion, bilateral 07/31/2015  . Pneumonia 04/2014  . PTSD (post-traumatic stress disorder)   . Pulmonary embolism ([place]) 04/2001; 09/19/2014   "after gallbladder OR; "  . Schizoaffective disorder ([place])   . Schizoaffective disorder, depressive  type ([place]) 08/28/2016  . Sleep apnea    "mild" (09/19/2014)  . Snoring    a. sleep study 5/16: No OSA  . SOB (shortness of breath) 07/31/2015  . Spinal stenosis   . Spondylosis   . Suicide attempt ([place]) 08/27/2016  . Type II diabetes mellitus ([place])    "dx'd in 2007; lost weight; no RX for ~ 4 yr now" (09/19/2014)  . Wheezing 02/25/2017    Surgical History: Past Surgical History:  Procedure Laterality Date  . ABDOMINAL HYSTERECTOMY  1985  . ANTERIOR CERVICAL DECOMP/DISCECTOMY FUSION  10/2012   in [place] C5-C7   . APPENDECTOMY  1985  . BACK SURGERY    . BREAST CYST EXCISION Right   . BREAST EXCISIONAL BIOPSY Right YRS AGO   NEG  . CARDIAC CATHETERIZATION   2000's; 2009  . CHOLECYSTECTOMY  2002  . COLONOSCOPY WITH PROPOFOL N/A 12/12/2016   Procedure: COLONOSCOPY WITH PROPOFOL;  Surgeon: [name], MD;  [facility] END[facility]COPY;  Service: Endoscopy;  Laterality: N/A;  . COLONOSCOPY WITH PROPOFOL N/A 02/17/2017   Procedure: COLONOSCOPY WITH PROPOFOL;  Surgeon: [name], MD;  Location: [facility] ENDOSCOPY;  Service: Gastroenterology;  Laterality: N/A;  . ESOPHAGOGASTRODUODENOSCOPY (EGD) WITH PROPOFOL N/A 02/17/2017   Procedure: ESOPHAGOGASTRODUODENOSCOPY (EGD) WITH PROPOFOL;  Surgeon: [name], MD;  Location[facility] ENDOSCOPY;  Service: Gastroenterology;  Laterality: N/A;  . ESOPHAGOGASTRODUODENOSCOPY (EGD) WITH PROPOFOL N/A 03/07/2019   Procedure: ESOPHAGOGASTRODUODENOSCOPY (EGD) WITH PROPOFOL;  Surgeon: [name], MD;  Location: [facility] ENDOSCOPY;  Service: Gastroenterology;  Laterality: N/A;  . EXCISION/RELEASE BURSA HIP Right 12/1984  . HERNIA REPAIR  1985  . LEFT HEART CATHETERIZATION WITH CORONARY ANGIOGRAM N/A 12/09/2014   Procedure: LEFT HEART CATHETERIZATION WITH CORONARY ANGIOGRAM;  Surgeon: Burnell Blanks, MD;  Location: [facility] CATH LAB;  Service: Cardiovascular;  Laterality: N/A;  . TONSILLECTOMY AND ADENOIDECTOMY  ~ 1968  . TOTAL HIP ARTHROPLASTY Left 04-06-2014  . UMBILICAL  HERNIA REPAIR  1985  . VENA CAVA FILTER PLACEMENT  03/2014    Home Medications:  Allergies as of 11/05/2020      Reactions   Abilify [aripiprazole] Other (See Comments)   Jerking movement, slow motor skills   Augmentin [amoxicillin-pot Clavulanate] Diarrhea, Nausea And Vomiting   Bactrim [sulfamethoxazole-trimethoprim] Diarrhea   Ceftin [cefuroxime Axetil] Diarrhea   Coconut Oil    Rash    Diclofenac Other (See Comments)    Muscle Pain   Dye Fdc Red [red Dye]    "hair dye"   Indocin [indomethacin] Other (See Comments)   Migraines    Linaclotide Diarrhea   Lisinopril Diarrhea, Other (See Comments)   Other reaction(s): Dizziness, Vomiting   Morphine Other (See Comments)   Chest Tightness   Morphine And Related Other (See Comments)   Chest tightness    Simvastatin Other (See Comments)   Muscle Pain  Sulfa Antibiotics Diarrhea   Sulfamethoxazole-trimethoprim Diarrhea   Wellbutrin [bupropion] Other (See Comments)   Jerking movement Jerking movement Slurred speech   Zetia [ezetimibe]    Diarrhea   Quetiapine Fumarate Palpitations   Quetiapine Fumarate Palpitations      Medication List       Accurate as of November 05, 2020  9:33 AM. If you have any questions, ask your nurse or doctor.        STOP taking these medications   ciprofloxacin 0.3 % ophthalmic solution Commonly known as: CILOXAN Stopped by: Reece Packer, MD     TAKE these medications   acetaminophen 500 MG tablet Commonly known as: TYLENOL Take 1 tablet (500 mg total) by mouth every 6 (six) hours as needed.   baclofen 10 MG tablet Commonly known as: LIORESAL TAKE 1 TABLET BY MOUTH THREE TIMES A DAY AS NEEDED FOR MUSCLE SPASMS   carvedilol 3.125 MG tablet Commonly known as: COREG TAKE 1 TABLET (3.125 MG TOTAL) BY MOUTH 2 (TWO) TIMES DAILY WITH A MEAL.   cetirizine 10 MG tablet Commonly known as: ZYRTEC TAKE 1 TABLET BY MOUTH EVERY DAY   dicyclomine 20 MG tablet Commonly known as: BENTYL TAKE  1 TABLET BY MOUTH THREE TIMES A DAY AS NEEDED FOR MUSCLE SPASMS   diphenhydrAMINE 25 MG tablet Commonly known as: BENADRYL Take 1 tablet (25 mg total) by mouth every 6 (six) hours.   Eliquis 5 MG Tabs tablet Generic drug: apixaban TAKE 1 TABLET BY MOUTH TWICE A DAY   escitalopram 20 MG tablet Commonly known as: LEXAPRO TAKE 1 TABLET BY MOUTH EVERY DAY   estradiol 0.1 MG/GM vaginal cream Commonly known as: ESTRACE Dispose applicator, use pea size amount 3 times a week at night   famotidine 20 MG tablet Commonly known as: PEPCID TAKE 1 TABLET BY MOUTH EVERYDAY AT BEDTIME.   furosemide 20 MG tablet Commonly known as: LASIX TAKE 1 TABLET (20 MG TOTAL) BY MOUTH DAILY AS NEEDED FOR EDEMA.   gabapentin 600 MG tablet Commonly known as: NEURONTIN TAKE 1 TABLET IN THE AM AND 1.5 TABLETS AT BEDTIME   glucose blood test strip Check once daily, dispense per patient and insurance preference, dx code E11.9   isosorbide mononitrate 30 MG 24 hr tablet Commonly known as: IMDUR Take 30 mg by mouth daily.   Januvia 100 MG tablet Generic drug: sitaGLIPtin TAKE 1 TABLET BY MOUTH EVERY DAY   Jardiance 25 MG Tabs tablet Generic drug: empagliflozin TAKE 1 TABLET BY MOUTH EVERY DAY   Klor-Con M20 20 MEQ tablet Generic drug: potassium chloride SA TAKE 1 TABLET (20 MEQ TOTAL) BY MOUTH DAILY AS NEEDED (TAKE WITH LASIX).   levothyroxine 25 MCG tablet Commonly known as: SYNTHROID TAKE 1 TABLET BY MOUTH BEFORE BREAKFAST 2-3 HOURS BEFORE TAKING ANY OTHER MEDICATION OR SUPPLEMENT   lidocaine 5 % Commonly known as: Lidoderm Place 1 patch onto the skin daily. Remove & Discard patch within 12 hours.   nitroGLYCERIN 0.4 MG SL tablet Commonly known as: NITROSTAT Place 1 tablet (0.4 mg total) under the tongue every 5 (five) minutes as needed for chest pain.   OLANZapine 10 MG tablet Commonly known as: ZYPREXA TAKE 1 TABLET BY MOUTH EVERYDAY AT BEDTIME   ondansetron 4 MG tablet Commonly  known as: ZOFRAN TAKE 1 TABLET (4 MG TOTAL) BY MOUTH 2 (TWO) TIMES DAILY AS NEEDED FOR NAUSEA OR VOMITING.   pantoprazole 40 MG tablet Commonly known as: PROTONIX TAKE 1 TABLET BY MOUTH  EVERY DAY   Praluent 75 MG/ML Soaj Generic drug: Alirocumab every 14 (fourteen) days.   rosuvastatin 40 MG tablet Commonly known as: CRESTOR TAKE 1 TABLET BY MOUTH EVERY DAY   Super Thera Vite M Tabs Take by mouth.       Allergies:  Allergies  Allergen Reactions  . Abilify [Aripiprazole] Other (See Comments)    Jerking movement, slow motor skills  . Augmentin [Amoxicillin-Pot Clavulanate] Diarrhea and Nausea And Vomiting  . Bactrim [Sulfamethoxazole-Trimethoprim] Diarrhea  . Ceftin [Cefuroxime Axetil] Diarrhea  . Coconut Oil     Rash   . Diclofenac Other (See Comments)     Muscle Pain  . Dye Fdc Red [Red Dye]     "hair dye"  . Indocin [Indomethacin] Other (See Comments)    Migraines   . Linaclotide Diarrhea  . Lisinopril Diarrhea and Other (See Comments)    Other reaction(s): Dizziness, Vomiting  . Morphine Other (See Comments)    Chest Tightness  . Morphine And Related Other (See Comments)    Chest tightness   . Simvastatin Other (See Comments)    Muscle Pain  . Sulfa Antibiotics Diarrhea  . Sulfamethoxazole-Trimethoprim Diarrhea  . Wellbutrin [Bupropion] Other (See Comments)    Jerking movement Jerking movement Slurred speech  . Zetia [Ezetimibe]     Diarrhea   . Quetiapine Fumarate Palpitations  . Quetiapine Fumarate Palpitations    Family History: Family History  Problem Relation Age of Onset  . Stroke Mother        Deceased  . Lung cancer Father        Deceased  . Healthy Daughter   . Healthy Son        x 2  . Other Son        Suicide  . Cancer Brother   . Heart attack Neg Hx     Social History:  reports that she quit smoking about 41 years ago. Her smoking use included cigarettes. She has a 15.00 pack-year smoking history. She has never used smokeless  tobacco. She reports that she does not drink alcohol and does not use drugs.  ROS:                                        Physical Exam: There were no vitals taken for this visit.  Constitutional:  Alert and oriented, No acute distress. HEENT: [place] AT, moist mucus membranes.  Trachea midline, no masses.  Laboratory Data: Lab Results  Component Value Date   WBC 8.9 09/06/2020   HGB 12.7 09/06/2020   HCT 36.6 09/06/2020   MCV 92.9 09/06/2020   PLT 334 09/06/2020    Lab Results  Component Value Date   CREATININE 0.83 09/06/2020    No results found for: PSA  No results found for: TESTOSTERONE  Lab Results  Component Value Date   HGBA1C 7.4 (H) 01/27/2020    Urinalysis    Component Value Date/Time   COLORURINE YELLOW (A) 09/06/2020 1600   APPEARANCEUR Hazy (A) 09/20/2020 1328   LABSPEC 1.030 09/06/2020 1600   LABSPEC 1.021 05/27/2014 2137   PHURINE 5.0 09/06/2020 1600   GLUCOSEU 2+ (A) 09/20/2020 1328   GLUCOSEU NEGATIVE 08/21/2017 1118   HGBUR MODERATE (A) 09/06/2020 1600   BILIRUBINUR Negative 09/20/2020 1328   BILIRUBINUR Negative 05/27/2014 2137   KETONESUR NEGATIVE 09/06/2020 1600   PROTEINUR Negative 09/20/2020 1328   PROTEINUR NEGATIVE  09/06/2020 1600   UROBILINOGEN 1.0 10/23/2017 1027   UROBILINOGEN 1.0 08/21/2017 1118   NITRITE Negative 09/20/2020 1328   NITRITE NEGATIVE 09/06/2020 1600   LEUKOCYTESUR Negative 09/20/2020 [place] 09/06/2020 1600   LEUKOCYTESUR Negative 05/27/2014 2137    Pertinent Imaging:   Assessment & Plan: Reassurance given.  Picture drawn.  Watchful waiting discussed.  Using estrogen cream for another 2 months discussed.  See as needed discussed.   1. Gross hematuria  - Urinalysis, Complete   No follow-ups on file.  Reece Packer, MD  [facility] [address] [place], [place] [address] [phone]

## 2020-11-07 ENCOUNTER — Other Ambulatory Visit: Payer: Self-pay

## 2020-11-07 ENCOUNTER — Emergency Department: Payer: Medicare HMO

## 2020-11-07 ENCOUNTER — Encounter: Payer: Self-pay | Admitting: Emergency Medicine

## 2020-11-07 ENCOUNTER — Telehealth: Payer: Self-pay

## 2020-11-07 ENCOUNTER — Emergency Department
Admission: EM | Admit: 2020-11-07 | Discharge: 2020-11-07 | Disposition: A | Discharge status: Home / Self Care | Payer: Medicare HMO | Attending: Emergency Medicine | Admitting: Emergency Medicine

## 2020-11-07 DIAGNOSIS — E119 Type 2 diabetes mellitus without complications: Secondary | ICD-10-CM | POA: Insufficient documentation

## 2020-11-07 DIAGNOSIS — Z79899 Other long term (current) drug therapy: Secondary | ICD-10-CM | POA: Diagnosis not present

## 2020-11-07 DIAGNOSIS — Z7901 Long term (current) use of anticoagulants: Secondary | ICD-10-CM | POA: Diagnosis not present

## 2020-11-07 DIAGNOSIS — I110 Hypertensive heart disease with heart failure: Secondary | ICD-10-CM | POA: Diagnosis not present

## 2020-11-07 DIAGNOSIS — R2231 Localized swelling, mass and lump, right upper limb: Secondary | ICD-10-CM | POA: Diagnosis not present

## 2020-11-07 DIAGNOSIS — Z87891 Personal history of nicotine dependence: Secondary | ICD-10-CM | POA: Insufficient documentation

## 2020-11-07 DIAGNOSIS — R600 Localized edema: Secondary | ICD-10-CM

## 2020-11-07 DIAGNOSIS — Z86711 Personal history of pulmonary embolism: Secondary | ICD-10-CM | POA: Diagnosis not present

## 2020-11-07 DIAGNOSIS — Z96642 Presence of left artificial hip joint: Secondary | ICD-10-CM | POA: Diagnosis not present

## 2020-11-07 DIAGNOSIS — M7989 Other specified soft tissue disorders: Secondary | ICD-10-CM | POA: Diagnosis not present

## 2020-11-07 DIAGNOSIS — I5032 Chronic diastolic (congestive) heart failure: Secondary | ICD-10-CM | POA: Diagnosis not present

## 2020-11-07 DIAGNOSIS — Z86718 Personal history of other venous thrombosis and embolism: Secondary | ICD-10-CM | POA: Diagnosis not present

## 2020-11-07 DIAGNOSIS — M79601 Pain in right arm: Secondary | ICD-10-CM | POA: Diagnosis not present

## 2020-11-07 DIAGNOSIS — R609 Edema, unspecified: Secondary | ICD-10-CM

## 2020-11-07 DIAGNOSIS — I2510 Atherosclerotic heart disease of native coronary artery without angina pectoris: Secondary | ICD-10-CM | POA: Diagnosis not present

## 2020-11-07 LAB — URINALYSIS, COMPLETE
Bilirubin, UA: NEGATIVE
Ketones, UA: NEGATIVE
Leukocytes,UA: NEGATIVE
Nitrite, UA: NEGATIVE
Protein,UA: NEGATIVE
RBC, UA: NEGATIVE
Specific Gravity, UA: 1.01 (ref 1.005–1.030)
Urobilinogen, Ur: 0.2 mg/dL (ref 0.2–1.0)
pH, UA: 5.5 (ref 5.0–7.5)

## 2020-11-07 LAB — MICROSCOPIC EXAMINATION: RBC, Urine: NONE SEEN /hpf (ref 0–2)

## 2020-11-07 LAB — CULTURE, URINE COMPREHENSIVE

## 2020-11-07 MED ORDER — HYDROCHLOROTHIAZIDE 12.5 MG PO CAPS: 12.5000 mg | ORAL_CAPSULE | Freq: Every day | ORAL | 0 refills | Status: DC

## 2020-11-07 NOTE — ED Notes (Signed)
See triage note, pt reports numbness and swelling to right hand that started Monday. Denies injury. Ambulatory to treatment room. NAD noted.

## 2020-11-07 NOTE — Telephone Encounter (Signed)
Pt called back she is leaving the ED now. There was no DVT found. Pt stated that they told her that she needed to be seen by Monday no appts available

## 2020-11-07 NOTE — Discharge Instructions (Addendum)
Follow-up with your primary care provider if any continued problems or not improving.  A prescription for hydrochlorothiazide was sent to your pharmacy.  This is 1 tablet once a day.  Continue your regular medication as prescribed by your doctor.  Continue your regular medicines as prescribed by your doctor.  No DVT was seen on your ultrasound.

## 2020-11-07 NOTE — Telephone Encounter (Signed)
Pt called and stated that her right hand is swollen and index and thumb finger is numb. Pt states that she has history of clots. Transferred to [place] at [facility]

## 2020-11-07 NOTE — Telephone Encounter (Signed)
Note reviewed.  I think it would be fine for her to be seen at some point next week for follow-up.

## 2020-11-07 NOTE — Telephone Encounter (Signed)
Pt called back she is leaving the ED now. There was no DVT found. Pt stated that they told her that she needed to be seen by Monday no appts available. Sherell Christoffel,cma

## 2020-11-07 NOTE — Telephone Encounter (Signed)
Per chart patient in ED.

## 2020-11-07 NOTE — ED Triage Notes (Addendum)
Pt comes into the ED via POV c/o right arm/wrist swelling and paraesthesias.  Pt states that she noticed it Monday and now the swelling is also in her hand.  Pt denies any injury to the arm.  No redness or heat present at the swelling.

## 2020-11-07 NOTE — Telephone Encounter (Signed)
Called patient she advised right hand swollen and numb reports no injury, advised she will need to be evaluated to go to [facility] or ED for evaluation with history of clot this ned to be ruled out. Patient agreed and is having some one drive her to UC.

## 2020-11-07 NOTE — Telephone Encounter (Signed)
Noted. Agree with need for evaluation. Can you check on her later today to ensure she got evaluated? Thanks.

## 2020-11-07 NOTE — ED Provider Notes (Signed)
[facility] Emergency Department Provider Note  ____________________________________________   Event Date/Time   First MD Initiated Contact with Patient 11/07/20 1301     (approximate)  I have reviewed the triage vital signs and the nursing notes.   HISTORY  Chief Complaint Arm Pain and Arm Swelling   HPI Valerie Vazquez is a 65 y.o. female presents to the ED with complaint of right upper extremity pain and swelling.  Patient states that she noticed the swelling in her arm 3 days ago.  She denies any injury to her arm.  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  She states that her PCP sent her to the ED to rule out a DVT.  Patient denies any chest pain, shortness of breath, fever or chills.  Currently she rates her pain as a 2 out of 10.     Past Medical History:  Diagnosis Date  . Acute right-sided low back pain with right-sided sciatica 10/23/2017  . Anemia   . Bilateral renal cysts   . CAD (coronary artery disease) 09/28/2016  . Chest pain 10/14/2015  . Chronic back pain    "upper and lower" (09/19/2014)  . Chronic shoulder pain (Location of Primary Source of Pain) (Left) 01/29/2016  . Colitis 04/08/2016  . Coronary vasospasm ([place]) 12/09/2014  . Depression    "major" (09/19/2014)  . DVT (deep venous thrombosis) ([place])   . Esophageal spasm   . Essential hypertension 10/28/2013  . Gallstones   . Gastritis   . Gastroenteritis   . GERD (gastroesophageal reflux disease)   . Headache    "couple /month lately" (09/19/2014)  . Heart disease 12/09/2016  . High blood pressure   . High cholesterol   . History of blood transfusion 1985   related to hysterectomy  . History of nuclear stress test    Myoview 1/19:  EF 65, no ischemia or scar  . History of pulmonary embolus (PE) 09/19/2014  . Hx of cervical spine surgery 01/29/2016  . 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 Rule out Paget's disease of the bone with blood work.   .  Hyperlipidemia 10/28/2013  . Hypertension   . IBS (irritable bowel syndrome)   . Lumbar central spinal stenosis (moderate at L4-5; mild at L2-3 and L3-4) 01/29/2016  . Migraine    "a few times/yr" (09/19/2014)  . Myocardial infarction ([place]) 10/2010 X 3   "while hospitalized"  . Neck pain 01/29/2016  . Osteoarthritis    "qwhere; mostly around my joints" (09/19/2014)  . Pleural effusion, bilateral 07/31/2015  . Pneumonia 04/2014  . PTSD (post-traumatic stress disorder)   . Pulmonary embolism ([place]) 04/2001; 09/19/2014   "after gallbladder OR; "  . Schizoaffective disorder ([place])   . Schizoaffective disorder, depressive type ([place]) 08/28/2016  . Sleep apnea    "mild" (09/19/2014)  . Snoring    a. sleep study 5/16: No OSA  . SOB (shortness of breath) 07/31/2015  . Spinal stenosis   . Spondylosis   . Suicide attempt ([place]) 08/27/2016  . Type II diabetes mellitus ([place])    "dx'd in 2007; lost weight; no RX for ~ 4 yr now" (09/19/2014)  . Wheezing 02/25/2017    Patient Active Problem List   Diagnosis Date Noted  . Gross hematuria 09/14/2020  . Bilateral low back pain without sciatica 06/20/2020  . Surgery follow-up examination 04/12/2020  . Paronychia of great toe of right foot 04/05/2020  . Paronychia of great  toe, right 02/27/2020  . Foraminal stenosis of cervical region 01/31/2020  . Cervical fusion syndrome (C5-C7 2014) 01/31/2020  . Ingrown toenail 01/27/2020  . Chronic diastolic heart failure ([place]) 01/27/2020  . Postmenopausal estrogen deficiency 08/12/2019  . Cysts of both ovaries 06/15/2019  . Left wrist pain 05/20/2019  . Allergic rhinitis 01/27/2019  . Right hip pain 10/06/2018  . Dental infection 03/21/2018  . Urinary incontinence 10/27/2017  . Elevated alkaline phosphatase level 10/27/2017  . Subclinical hypothyroidism 08/26/2017  . Diabetes mellitus without complication ([place]) 62/83/6629  . Colon polyp 01/30/2017  . Right upper quadrant pain 01/12/2017  . Plantar fasciitis  of left foot 11/07/2016  . CAD (coronary artery disease) 09/28/2016  . Epigastric discomfort 09/16/2016  . Schizoaffective disorder, depressive type ([place]) 08/28/2016  . Colitis 04/08/2016  . Cervicalgia 01/29/2016  . Chronic pain syndrome 01/29/2016  . Osteoarthritis of hip (Bilateral) 01/29/2016  . Lumbar central spinal stenosis (moderate at L4-5; mild at L2-3 and L3-4) 01/29/2016  . Breast pain 10/19/2015  . Pleural effusion, bilateral 07/31/2015  . Esophageal spasm 06/05/2015  . History of DVT (deep vein thrombosis) 02/25/2015  . 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 Constipation 12/12/2014  . Coronary vasospasm ([place]) 12/09/2014  . History of pulmonary embolus (PE) 09/19/2014  . Anxiety and depression 04/13/2014  . IBS (irritable bowel syndrome) 04/11/2014  . GERD (gastroesophageal reflux disease) 04/11/2014  . Essential hypertension 10/28/2013  . Anemia 10/28/2013  . Hyperlipidemia 10/28/2013    Past Surgical History:  Procedure Laterality Date  . ABDOMINAL HYSTERECTOMY  1985  . ANTERIOR CERVICAL DECOMP/DISCECTOMY FUSION  10/2012   in [place] C5-C7   . APPENDECTOMY  1985  . BACK SURGERY    . BREAST CYST EXCISION Right   . BREAST EXCISIONAL BIOPSY Right YRS AGO   NEG  . CARDIAC CATHETERIZATION   2000's; 2009  . CHOLECYSTECTOMY  2002  . COLONOSCOPY WITH PROPOFOL N/A 12/12/2016   Procedure: COLONOSCOPY WITH PROPOFOL;  Surgeon: [name], MD;  Location[facility] ENDOSCOPY;  Service: Endoscopy;  Laterality: N/A;  . COLONOSCOPY WITH PROPOFOL N/A 02/17/2017   Procedure: COLONOSCOPY WITH PROPOFOL;  Surgeon: [name], MD;  Location: [facility] ENDOSCOPY;  Service: Gastroenterology;  Laterality: N/A;  . ESOPHAGOGASTRODUODENOSCOPY (EGD) WITH PROPOFOL N/A 02/17/2017   Procedure: ESOPHAGOGASTRODUODENOSCOPY (EGD) WITH PROPOFOL;  Surgeon: [name], MD;  Location: [facility] ENDOSCOPY;  Service: Gastroenterology;  Laterality: N/A;  . ESOPHAGOGASTRODUODENOSCOPY (EGD) WITH PROPOFOL N/A 03/07/2019    Procedure: ESOPHAGOGASTRODUODENOSCOPY (EGD) WITH PROPOFOL;  Surgeon: [name], MD;  Location: [facility] ENDOSCOPY;  Service: Gastroenterology;  Laterality: N/A;  . EXCISION/RELEASE BURSA HIP Right 12/1984  . HERNIA REPAIR  1985  . LEFT HEART CATHETERIZATION WITH CORONARY ANGIOGRAM N/A 12/09/2014   Procedure: LEFT HEART CATHETERIZATION WITH CORONARY ANGIOGRAM;  Surgeon: Burnell Blanks, MD;  Location: [facility] CATH LAB;  Service: Cardiovascular;  Laterality: N/A;  . TONSILLECTOMY AND ADENOIDECTOMY  ~ 1968  . TOTAL HIP ARTHROPLASTY Left 04-06-2014  . UMBILICAL HERNIA REPAIR  1985  . VENA CAVA FILTER PLACEMENT  03/2014    Prior to Admission medications   Medication Sig Start Date End Date Taking? Authorizing Provider  hydrochlorothiazide (MICROZIDE) 12.5 MG capsule Take 1 capsule (12.5 mg total) by mouth daily. 11/07/20 11/07/21 Yes Johnn Hai, PA-C  acetaminophen (TYLENOL) 500 MG tablet Take 1 tablet (500 mg total) by mouth every 6 (six) hours as needed. 09/29/18   Laban Emperor, PA-C  baclofen (LIORESAL) 10 MG tablet TAKE 1 TABLET BY MOUTH THREE TIMES A DAY AS NEEDED  FOR MUSCLE SPASMS 10/01/20   Einar Pheasant, MD  carvedilol (COREG) 3.125 MG tablet TAKE 1 TABLET (3.125 MG TOTAL) BY MOUTH 2 (TWO) TIMES DAILY WITH A MEAL. 07/30/20   Burnell Blanks, MD  cetirizine (ZYRTEC) 10 MG tablet TAKE 1 TABLET BY MOUTH EVERY DAY 02/27/20   [name], MD  dicyclomine (BENTYL) 20 MG tablet TAKE 1 TABLET BY MOUTH THREE TIMES A DAY AS NEEDED FOR MUSCLE SPASMS 12/05/19   [name], MD  diphenhydrAMINE (BENADRYL) 25 MG tablet Take 1 tablet (25 mg total) by mouth every 6 (six) hours. 09/12/16   Jola Schmidt, MD  ELIQUIS 5 MG TABS tablet TAKE 1 TABLET BY MOUTH TWICE A DAY 09/10/20   [name], MD  escitalopram (LEXAPRO) 20 MG tablet TAKE 1 TABLET BY MOUTH EVERY DAY 07/25/20   [name], MD  estradiol (ESTRACE) 0.1 MG/GM vaginal cream Dispose applicator, use pea size amount 3  times a week at night 09/24/20   Bjorn Loser, MD  famotidine (PEPCID) 20 MG tablet TAKE 1 TABLET BY MOUTH EVERYDAY AT BEDTIME. 09/14/20   [name], MD  furosemide (LASIX) 20 MG tablet TAKE 1 TABLET (20 MG TOTAL) BY MOUTH DAILY AS NEEDED FOR EDEMA. 04/09/20   Einar Pheasant, MD  gabapentin (NEURONTIN) 600 MG tablet TAKE 1 TABLET IN THE AM AND 1.5 TABLETS AT BEDTIME 07/16/20   [name], MD  glucose blood test strip Check once daily, dispense per patient and insurance preference, dx code E11.9 12/20/19   [name], MD  isosorbide mononitrate (IMDUR) 30 MG 24 hr tablet Take 30 mg by mouth daily.    [provider]  JANUVIA 100 MG tablet TAKE 1 TABLET BY MOUTH EVERY DAY 07/30/20   [name], MD  JARDIANCE 25 MG TABS tablet TAKE 1 TABLET BY MOUTH EVERY DAY 10/15/20   [name], MD  KLOR-CON M20 20 MEQ tablet TAKE 1 TABLET (20 MEQ TOTAL) BY MOUTH DAILY AS NEEDED (TAKE WITH LASIX). 10/04/20   Einar Pheasant, MD  levothyroxine (SYNTHROID) 25 MCG tablet TAKE 1 TABLET BY MOUTH BEFORE BREAKFAST 2-3 HOURS BEFORE TAKING ANY OTHER MEDICATION OR SUPPLEMENT 03/08/20   [name], MD  lidocaine (LIDODERM) 5 % Place 1 patch onto the skin daily. Remove & Discard patch within 12 hours. 10/06/18   [name], MD  Multiple Vitamins-Minerals (SUPER THERA VITE M) TABS Take by mouth.    [provider]  nitroGLYCERIN (NITROSTAT) 0.4 MG SL tablet Place 1 tablet (0.4 mg total) under the tongue every 5 (five) minutes as needed for chest pain. 10/19/19   Burnell Blanks, MD  OLANZapine (ZYPREXA) 10 MG tablet TAKE 1 TABLET BY MOUTH EVERYDAY AT BEDTIME 09/10/20   [name], MD  ondansetron (ZOFRAN) 4 MG tablet TAKE 1 TABLET (4 MG TOTAL) BY MOUTH 2 (TWO) TIMES DAILY AS NEEDED FOR NAUSEA OR VOMITING. 10/15/20   [name], MD  pantoprazole (PROTONIX) 40 MG tablet TAKE 1 TABLET BY MOUTH EVERY DAY 09/03/20   [name], MD  PRALUENT  75 MG/ML SOAJ every 14 (fourteen) days. 07/11/20   [provider]  rosuvastatin (CRESTOR) 40 MG tablet TAKE 1 TABLET BY MOUTH EVERY DAY 09/03/20   [name], MD    Allergies Abilify [aripiprazole], Augmentin [amoxicillin-pot clavulanate], Bactrim [sulfamethoxazole-trimethoprim], Ceftin [cefuroxime axetil], Coconut oil, Diclofenac, Dye fdc red [red dye], Indocin [indomethacin], Linaclotide, Lisinopril, Morphine, Morphine and related, Simvastatin, Sulfa antibiotics,  Sulfamethoxazole-trimethoprim, Wellbutrin [bupropion], Zetia [ezetimibe], Quetiapine fumarate, and Quetiapine fumarate  Family History  Problem Relation Age of Onset  . Stroke Mother        Deceased  . Lung cancer Father        Deceased  . Healthy Daughter   . Healthy Son        x 2  . Other Son        Suicide  . Cancer Brother   . Heart attack Neg Hx     Social History Social History   Tobacco Use  . Smoking status: Former Smoker    Packs/day: 1.50    Years: 10.00    Pack years: 15.00    Types: Cigarettes    Quit date: 04/12/1979    Years since quitting: 41.6  . Smokeless tobacco: Never Used  Vaping Use  . Vaping Use: Never used  Substance Use Topics  . Alcohol use: No    Alcohol/week: 0.0 standard drinks  . Drug use: No    Review of Systems Constitutional: No fever/chills Eyes: No visual changes. Cardiovascular: Denies chest pain. Respiratory: Denies shortness of breath. Gastrointestinal: No abdominal pain.  No nausea, no vomiting.  No diarrhea.  Musculoskeletal: Positive right upper extremity pain and swelling. Skin: Negative for rash. Neurological: Negative for headaches, focal weakness or numbness. ____________________________________________   PHYSICAL EXAM:  VITAL SIGNS: ED Triage Vitals  Enc Vitals Group     BP 11/07/20 1225 (!) 164/97     Pulse Rate 11/07/20 1225 75     Resp 11/07/20 1225 17     Temp 11/07/20 1225 98.5 F (36.9 C)     Temp Source 11/07/20 1225 Oral      SpO2 11/07/20 1225 97 %     Weight 11/07/20 1226 222 lb (100.7 kg)     Height 11/07/20 1226 5\' 6"  (1.676 m)     Head Circumference --      Peak Flow --      Pain Score 11/07/20 1226 2     Pain Loc --      Pain Edu? --      Excl. in [place]? --    Constitutional: Alert and oriented. Well appearing and in no acute distress. Eyes: Conjunctivae are normal.  Head: Atraumatic. Neck: No stridor.   Cardiovascular: Normal rate, regular rhythm. Grossly normal heart sounds.  Good peripheral circulation. Respiratory: Normal respiratory effort.  No retractions. Lungs CTAB. Gastrointestinal: Soft and nontender. No distention.  Musculoskeletal: Examination of the right upper extremity there is moderate amount of soft tissue edema in comparison with the left wrist, forearm area.  There is no skin discoloration.  Skin is intact.  Pulses present bilaterally.  Motor sensory function intact.  There is tenderness on palpation just above the right elbow.  Patient has increased pain with range of motion. Neurologic:  Normal speech and language. No gross focal neurologic deficits are appreciated.  Skin:  Skin is warm, dry and intact.  No abrasions or discoloration noted. Psychiatric: Mood and affect are normal. Speech and behavior are normal.  ____________________________________________   LABS (all labs ordered are listed, but only abnormal results are displayed)  Labs Reviewed - No data to display ____________________________________________   RADIOLOGY I, Johnn Hai, personally viewed and evaluated these images (plain radiographs) as part of my medical decision making, as well as reviewing the written report by the radiologist.    Official radiology report(s): US Venous Img Upper Uni Right(DVT)  Result Date: 11/07/2020 CLINICAL  DATA:  Right upper extremity pain and edema. History of diabetes and DVT/pulmonary embolism. Former smoker. EXAM: RIGHT UPPER EXTREMITY VENOUS DOPPLER ULTRASOUND TECHNIQUE:  Gray-scale sonography with graded compression, as well as color Doppler and duplex ultrasound were performed to evaluate the upper extremity deep venous system from the level of the subclavian vein and including the jugular, axillary, basilic, radial, ulnar and upper cephalic vein. Spectral Doppler was utilized to evaluate flow at rest and with distal augmentation maneuvers. COMPARISON:  Right upper extremity venous Doppler ultrasound-06/09/2014 (negative). FINDINGS: Contralateral Subclavian Vein: Respiratory phasicity is normal and symmetric with the symptomatic side. No evidence of thrombus. Normal compressibility. Internal Jugular Vein: No evidence of thrombus. Normal compressibility, respiratory phasicity and response to augmentation. Subclavian Vein: No evidence of thrombus. Normal compressibility, respiratory phasicity and response to augmentation. Axillary Vein: No evidence of thrombus. Normal compressibility, respiratory phasicity and response to augmentation. Cephalic Vein: No evidence of thrombus. Normal compressibility, respiratory phasicity and response to augmentation. Basilic Vein: No evidence of thrombus. Normal compressibility, respiratory phasicity and response to augmentation. Brachial Veins: No evidence of thrombus. Normal compressibility, respiratory phasicity and response to augmentation. Radial Veins: No evidence of thrombus. Normal compressibility, respiratory phasicity and response to augmentation. Ulnar Veins: No evidence of thrombus. Normal compressibility, respiratory phasicity and response to augmentation. Venous Reflux:  None visualized. Other Findings:  None visualized. IMPRESSION: No evidence of DVT within the right upper extremity. Electronically Signed   By: Sandi Mariscal M.D.   On: 11/07/2020 15:28    ____________________________________________   PROCEDURES  Procedure(s) performed (including Critical  Care):  Procedures   ____________________________________________   INITIAL IMPRESSION / ASSESSMENT AND PLAN / ED COURSE  As part of my medical decision making, I reviewed the following data within the electronic MEDICAL RECORD NUMBER Notes from prior ED visits and [place] Controlled Substance Database  65 year old female presents to the ED with complaint of right arm edema that started approximately 3 days ago.  Patient has a history of DVT to her right arm and is concerned.  Her PCP sent her to the emergency department to rule out a DVT.  Venous ultrasound of the right upper extremity is negative for DVT.  Patient was made aware and reassured.  She is to continue with her regular medication including the Eliquis that she is currently taking.  A prescription for hydrochlorothiazide 12.5 mg was sent to her pharmacy and patient is to follow-up with her PCP if any continued problems or not improving.  She is return to the emergency department if any severe worsening of her symptoms.  ____________________________________________   FINAL CLINICAL IMPRESSION(S) / ED DIAGNOSES  Final diagnoses:  Swelling  Edema of right upper arm     ED Discharge Orders         Ordered    hydrochlorothiazide (MICROZIDE) 12.5 MG capsule  Daily        11/07/20 1541          *Please note:  Valerie Vazquez was evaluated in Emergency Department on 11/07/2020 for the symptoms described in the history of present illness. She was evaluated in the context of the global COVID-19 pandemic, which necessitated consideration that the patient might be at risk for infection with the SARS-CoV-2 virus that causes COVID-19. Institutional protocols and algorithms that pertain to the evaluation of patients at risk for COVID-19 are in a state of rapid change based on information released by regulatory bodies including the CDC and federal and state organizations. These policies and algorithms  were followed during the patient's care in the  ED.  Some ED evaluations and interventions may be delayed as a result of limited staffing during and the pandemic.*   Note:  This document was prepared using Dragon voice recognition software and may include unintentional dictation errors.    Johnn Hai, PA-C 11/07/20 1547    Harvest Dark, MD 11/08/20 1440

## 2020-11-08 NOTE — Telephone Encounter (Signed)
Patient is scheduled for a follow up next week.  Valerie Vazquez,cma

## 2020-11-09 ENCOUNTER — Other Ambulatory Visit: Payer: Self-pay

## 2020-11-09 ENCOUNTER — Encounter: Payer: Self-pay | Admitting: Cardiology

## 2020-11-09 ENCOUNTER — Ambulatory Visit: Payer: Medicare HMO | Admitting: Cardiology

## 2020-11-09 VITALS — BP 120/70 | HR 73 | Ht 66.0 in | Wt 223.0 lb

## 2020-11-09 DIAGNOSIS — E7800 Pure hypercholesterolemia, unspecified: Secondary | ICD-10-CM | POA: Diagnosis not present

## 2020-11-09 DIAGNOSIS — I2584 Coronary atherosclerosis due to calcified coronary lesion: Secondary | ICD-10-CM | POA: Diagnosis not present

## 2020-11-09 DIAGNOSIS — Z86718 Personal history of other venous thrombosis and embolism: Secondary | ICD-10-CM | POA: Diagnosis not present

## 2020-11-09 DIAGNOSIS — I2510 Atherosclerotic heart disease of native coronary artery without angina pectoris: Secondary | ICD-10-CM | POA: Diagnosis not present

## 2020-11-09 DIAGNOSIS — I10 Essential (primary) hypertension: Secondary | ICD-10-CM | POA: Diagnosis not present

## 2020-11-09 NOTE — Progress Notes (Signed)
Cardiology Office Note:    Date:  11/09/2020   ID:  Valerie Vazquez, DOB July 01, 1956, MRN [id number]  PCP:  [name], MD   [place]  Cardiologist:  Kate Sable, MD  Advanced Practice Provider:  No care team member to display Electrophysiologist:  None       Referring MD: [name], MD   Chief Complaint  Patient presents with  . Other    ED follow up for Edema. Patient c.o SOB with exertion and weight gain/Swelling. Meds reviewed verbally with patient.     History of Present Illness:    Valerie Vazquez is a 65 y.o. female with a hx of Schizoaffective disorder, diabetes, coronary vasospasm hypertension history of DVT on Eliquis who presents due to edema.  Patient was seen in the ED 2 days ago due to right upper extremity swelling.  Ultrasound did not reveal any evidence of DVT within the right upper extremity.  She was started on HCTZ 12.5 mg, with resolution of symptoms.  She currently feels well, has no other concerns at this time.  She takes Lasix only as needed.  Prior notes  Lexiscan 07/2020 low risk study, no evidence for ischemia, EF 55 to 65% Echocardiogram 12/979, normal systolic function, EF 60 to 65%, impaired relaxation.  Past Medical History:  Diagnosis Date  . Acute right-sided low back pain with right-sided sciatica 10/23/2017  . Anemia   . Bilateral renal cysts   . CAD (coronary artery disease) 09/28/2016  . Chest pain 10/14/2015  . Chronic back pain    "upper and lower" (09/19/2014)  . Chronic shoulder pain (Location of Primary Source of Pain) (Left) 01/29/2016  . Colitis 04/08/2016  . Coronary vasospasm ([place]) 12/09/2014  . Depression    "major" (09/19/2014)  . DVT (deep venous thrombosis) ([place])   . Esophageal spasm   . Essential hypertension 10/28/2013  . Gallstones   . Gastritis   . Gastroenteritis   . GERD (gastroesophageal reflux disease)   . Headache    "couple /month lately" (09/19/2014)  . Heart  disease 12/09/2016  . High blood pressure   . High cholesterol   . History of blood transfusion 1985   related to hysterectomy  . History of nuclear stress test    Myoview 1/19:  EF 65, no ischemia or scar  . History of pulmonary embolus (PE) 09/19/2014  . Hx of cervical spine surgery 01/29/2016  . 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 Rule out Paget's disease of the bone with blood work.   . Hyperlipidemia 10/28/2013  . Hypertension   . IBS (irritable bowel syndrome)   . Lumbar central spinal stenosis (moderate at L4-5; mild at L2-3 and L3-4) 01/29/2016  . Migraine    "a few times/yr" (09/19/2014)  . Myocardial infarction ([place]) 10/2010 X 3   "while hospitalized"  . Neck pain 01/29/2016  . Osteoarthritis    "qwhere; mostly around my joints" (09/19/2014)  . Pleural effusion, bilateral 07/31/2015  . Pneumonia 04/2014  . PTSD (post-traumatic stress disorder)   . Pulmonary embolism ([place]) 04/2001; 09/19/2014   "after gallbladder OR; "  . Schizoaffective disorder ([place])   . Schizoaffective disorder, depressive type ([place]) 08/28/2016  . Sleep apnea    "mild" (09/19/2014)  . Snoring    a. sleep study 5/16: No OSA  . SOB (shortness of breath) 07/31/2015  . Spinal stenosis   . Spondylosis   . Suicide attempt ([place]) 08/27/2016  .  Type II diabetes mellitus ([place])    "dx'd in 2007; lost weight; no RX for ~ 4 yr now" (09/19/2014)  . Wheezing 02/25/2017    Past Surgical History:  Procedure Laterality Date  . ABDOMINAL HYSTERECTOMY  1985  . ANTERIOR CERVICAL DECOMP/DISCECTOMY FUSION  10/2012   in [place] C5-C7   . APPENDECTOMY  1985  . BACK SURGERY    . BREAST CYST EXCISION Right   . BREAST EXCISIONAL BIOPSY Right YRS AGO   NEG  . CARDIAC CATHETERIZATION   2000's; 2009  . CHOLECYSTECTOMY  2002  . COLONOSCOPY WITH PROPOFOL N/A 12/12/2016   Procedure: COLONOSCOPY WITH PROPOFOL;  Surgeon: [name], MD;  Location: [facility] ENDOSCOPY;  Service: Endoscopy;  Laterality: N/A;  .  COLONOSCOPY WITH PROPOFOL N/A 02/17/2017   Procedure: COLONOSCOPY WITH PROPOFOL;  Surgeon: [name], MD;  Location: [facility] ENDOSCOPY;  Service: Gastroenterology;  Laterality: N/A;  . ESOPHAGOGASTRODUODENOSCOPY (EGD) WITH PROPOFOL N/A 02/17/2017   Procedure: ESOPHAGOGASTRODUODENOSCOPY (EGD) WITH PROPOFOL;  Surgeon: [name], MD;  Location: [facility] ENDOSCOPY;  Service: Gastroenterology;  Laterality: N/A;  . ESOPHAGOGASTRODUODENOSCOPY (EGD) WITH PROPOFOL N/A 03/07/2019   Procedure: ESOPHAGOGASTRODUODENOSCOPY (EGD) WITH PROPOFOL;  Surgeon: [name], MD;  Location: [facility] ENDOSCOPY;  Service: Gastroenterology;  Laterality: N/A;  . EXCISION/RELEASE BURSA HIP Right 12/1984  . HERNIA REPAIR  1985  . LEFT HEART CATHETERIZATION WITH CORONARY ANGIOGRAM N/A 12/09/2014   Procedure: LEFT HEART CATHETERIZATION WITH CORONARY ANGIOGRAM;  Surgeon: Burnell Blanks, MD;  Location: [facility] CATH LAB;  Service: Cardiovascular;  Laterality: N/A;  . TONSILLECTOMY AND ADENOIDECTOMY  ~ 1968  . TOTAL HIP ARTHROPLASTY Left 04-06-2014  . UMBILICAL HERNIA REPAIR  1985  . VENA CAVA FILTER PLACEMENT  03/2014    Current Medications: Current Meds  Medication Sig  . acetaminophen (TYLENOL) 500 MG tablet Take 1 tablet (500 mg total) by mouth every 6 (six) hours as needed.  . baclofen (LIORESAL) 10 MG tablet TAKE 1 TABLET BY MOUTH THREE TIMES A DAY AS NEEDED FOR MUSCLE SPASMS  . cetirizine (ZYRTEC) 10 MG tablet TAKE 1 TABLET BY MOUTH EVERY DAY  . dicyclomine (BENTYL) 20 MG tablet TAKE 1 TABLET BY MOUTH THREE TIMES A DAY AS NEEDED FOR MUSCLE SPASMS  . diphenhydrAMINE (BENADRYL) 25 MG tablet Take 1 tablet (25 mg total) by mouth every 6 (six) hours.  Marland Kitchen ELIQUIS 5 MG TABS tablet TAKE 1 TABLET BY MOUTH TWICE A DAY  . escitalopram (LEXAPRO) 20 MG tablet TAKE 1 TABLET BY MOUTH EVERY DAY  . estradiol (ESTRACE) 0.1 MG/GM vaginal cream Dispose applicator, use pea size amount 3 times a week at night  . famotidine (PEPCID) 20 MG tablet TAKE 1  TABLET BY MOUTH EVERYDAY AT BEDTIME.  . furosemide (LASIX) 20 MG tablet TAKE 1 TABLET (20 MG TOTAL) BY MOUTH DAILY AS NEEDED FOR EDEMA.  Marland Kitchen gabapentin (NEURONTIN) 600 MG tablet TAKE 1 TABLET IN THE AM AND 1.5 TABLETS AT BEDTIME  . hydrochlorothiazide (MICROZIDE) 12.5 MG capsule Take 1 capsule (12.5 mg total) by mouth daily.  . isosorbide mononitrate (IMDUR) 30 MG 24 hr tablet Take 30 mg by mouth daily.  Marland Kitchen JANUVIA 100 MG tablet TAKE 1 TABLET BY MOUTH EVERY DAY  . JARDIANCE 25 MG TABS tablet TAKE 1 TABLET BY MOUTH EVERY DAY  . KLOR-CON M20 20 MEQ tablet TAKE 1 TABLET (20 MEQ TOTAL) BY MOUTH DAILY AS NEEDED (TAKE WITH LASIX).  Marland Kitchen levothyroxine (SYNTHROID) 25 MCG tablet TAKE 1 TABLET BY MOUTH BEFORE BREAKFAST 2-3 HOURS BEFORE TAKING  ANY OTHER MEDICATION OR SUPPLEMENT  . lidocaine (LIDODERM) 5 % Place 1 patch onto the skin daily. Remove & Discard patch within 12 hours.  . Multiple Vitamins-Minerals (SUPER THERA VITE M) TABS Take by mouth.  . nitroGLYCERIN (NITROSTAT) 0.4 MG SL tablet Place 1 tablet (0.4 mg total) under the tongue every 5 (five) minutes as needed for chest pain.  Marland Kitchen OLANZapine (ZYPREXA) 10 MG tablet TAKE 1 TABLET BY MOUTH EVERYDAY AT BEDTIME  . ondansetron (ZOFRAN) 4 MG tablet TAKE 1 TABLET (4 MG TOTAL) BY MOUTH 2 (TWO) TIMES DAILY AS NEEDED FOR NAUSEA OR VOMITING.  . pantoprazole (PROTONIX) 40 MG tablet TAKE 1 TABLET BY MOUTH EVERY DAY  . rosuvastatin (CRESTOR) 40 MG tablet TAKE 1 TABLET BY MOUTH EVERY DAY  . [DISCONTINUED] carvedilol (COREG) 3.125 MG tablet TAKE 1 TABLET (3.125 MG TOTAL) BY MOUTH 2 (TWO) TIMES DAILY WITH A MEAL.     Allergies:   Abilify [aripiprazole], Augmentin [amoxicillin-pot clavulanate], Bactrim [sulfamethoxazole-trimethoprim], Ceftin [cefuroxime axetil], Coconut oil, Diclofenac, Dye fdc red [red dye], Indocin [indomethacin], Linaclotide, Lisinopril, Morphine, Morphine and related, Simvastatin, Sulfa antibiotics, Sulfamethoxazole-trimethoprim, Wellbutrin  [bupropion], Zetia [ezetimibe], Quetiapine fumarate, and Quetiapine fumarate   Social History   Socioeconomic History  . Marital status: Divorced    Spouse name: Not on file  . Number of children: Not on file  . Years of education: Not on file  . Highest education level: Not on file  Occupational History  . Not on file  Tobacco Use  . Smoking status: Former Smoker    Packs/day: 1.50    Years: 10.00    Pack years: 15.00    Types: Cigarettes    Quit date: 04/12/1979    Years since quitting: 41.6  . Smokeless tobacco: Never Used  Vaping Use  . Vaping Use: Never used  Substance and Sexual Activity  . Alcohol use: No    Alcohol/week: 0.0 standard drinks  . Drug use: No  . Sexual activity: Not Currently  Other Topics Concern  . Not on file  Social History Narrative   Lives with fiancee in an apartment on the first floor.  Has 3 grown children.  One child passed away.  On disability 11/22/94 - 2000, 2007 - current.     Education: some college.   Social Determinants of Health   Financial Resource Strain: Not on file  Food Insecurity: No Food Insecurity  . Worried About Charity fundraiser in the Last Year: Never true  . Ran Out of Food in the Last Year: Never true  Transportation Needs: No Transportation Needs  . Lack of Transportation (Medical): No  . Lack of Transportation (Non-Medical): No  Physical Activity: Not on file  Stress: Not on file  Social Connections: Not on file     Family History: The patient's family history includes Cancer in her brother; Healthy in her daughter and son; Lung cancer in her father; Other in her son; Stroke in her mother. There is no history of Heart attack.  ROS:   Please see the history of present illness.     All other systems reviewed and are negative.  EKGs/Labs/Other Studies Reviewed:    The following studies were reviewed today:   EKG:  EKG is  ordered today.  The ekg ordered today demonstrates normal sinus rhythm  Recent  Labs: 05/03/2020: ALT 21 09/06/2020: BUN 9; Creatinine, Ser 0.83; Hemoglobin 12.7; Platelets 334; Potassium 3.8; Sodium 141  Recent Lipid Panel    Component Value Date/Time  CHOL 169 03/12/2020 0935   TRIG 96.0 03/12/2020 0935   HDL 40.00 03/12/2020 0935   CHOLHDL 4 03/12/2020 0935   VLDL 19.2 03/12/2020 0935   LDLCALC 110 (H) 03/12/2020 0935   LDLDIRECT 111.0 05/03/2020 0800     Risk Assessment/Calculations:      Physical Exam:    VS:  BP 120/70 (BP Location: Left Arm, Patient Position: Sitting, Cuff Size: Normal)   Pulse 73   Ht 5\' 6"  (1.676 m)   Wt 223 lb (101.2 kg)   SpO2 97%   BMI 35.99 kg/m     Wt Readings from Last 3 Encounters:  11/09/20 223 lb (101.2 kg)  11/07/20 222 lb (100.7 kg)  11/05/20 222 lb (100.7 kg)     GEN:  Well nourished, well developed in no acute distress HEENT: Normal NECK: No JVD; No carotid bruits LYMPHATICS: No lymphadenopathy CARDIAC: RRR, no murmurs, rubs, gallops RESPIRATORY:  Clear to auscultation without rales, wheezing or rhonchi  ABDOMEN: Soft, non-tender, non-distended MUSCULOSKELETAL:  No edema; No deformity  SKIN: Warm and dry NEUROLOGIC:  Alert and oriented x 3 PSYCHIATRIC:  Normal affect   ASSESSMENT:    1. Primary hypertension   2. Pure hypercholesterolemia   3. History of DVT (deep vein thrombosis)   4. Coronary artery calcification    PLAN:    In order of problems listed above:  1. Hypertension, BP controlled.  Continue HCTZ 12.5 mg daily.  Stop Coreg. 2. Hyperlipidemia, continue Crestor. 3. History of DVT, on Eliquis, follows with primary care. 4. Coronary artery calcification on chest CT.  History of coronary vasospasm.  Continue Crestor, Eliquis, Imdur.  Denies anginal symptoms.   Follow-up in 6 months   Medication Adjustments/Labs and Tests Ordered: Current medicines are reviewed at length with the patient today.  Concerns regarding medicines are outlined above.  Orders Placed This Encounter  Procedures   . EKG 12-Lead   No orders of the defined types were placed in this encounter.   Patient Instructions  Medication Instructions:  Your physician has recommended you make the following change in your medication:   1.  STOP taking your Coreg (Carvedilol)  *If you need a refill on your cardiac medications before your next appointment, please call your pharmacy*   Lab Work: None ordered If you have labs (blood work) drawn today and your tests are completely normal, you will receive your results only by: Marland Kitchen MyChart Message (if you have MyChart) OR . A paper copy in the mail If you have any lab test that is abnormal or we need to change your treatment, we will call you to review the results.   Testing/Procedures: None ordered   Follow-Up: At [facility], you and your health needs are our priority.  As part of our continuing mission to provide you with exceptional heart care, we have created designated Provider Care Teams.  These Care Teams include your primary Cardiologist (physician) and Advanced Practice Providers (APPs -  Physician Assistants and Nurse Practitioners) who all work together to provide you with the care you need, when you need it.  We recommend signing up for the patient portal called "MyChart".  Sign up information is provided on this After Visit Summary.  MyChart is used to connect with patients for Virtual Visits (Telemedicine).  Patients are able to view lab/test results, encounter notes, upcoming appointments, etc.  Non-urgent messages can be sent to your provider as well.   To learn more about what you can do with MyChart,  go to [email or web address].    Your next appointment:   6 month(s)  The format for your next appointment:   In Person  Provider:   Kate Sable, MD   Other Instructions      Signed, Kate Sable, MD  11/09/2020 12:29 PM    [place]

## 2020-11-09 NOTE — Patient Instructions (Signed)
Medication Instructions:  Your physician has recommended you make the following change in your medication:   1.  STOP taking your Coreg (Carvedilol)  *If you need a refill on your cardiac medications before your next appointment, please call your pharmacy*   Lab Work: None ordered If you have labs (blood work) drawn today and your tests are completely normal, you will receive your results only by: Marland Kitchen MyChart Message (if you have MyChart) OR . A paper copy in the mail If you have any lab test that is abnormal or we need to change your treatment, we will call you to review the results.   Testing/Procedures: None ordered   Follow-Up: At [facility], you and your health needs are our priority.  As part of our continuing mission to provide you with exceptional heart care, we have created designated Provider Care Teams.  These Care Teams include your primary Cardiologist (physician) and Advanced Practice Providers (APPs -  Physician Assistants and Nurse Practitioners) who all work together to provide you with the care you need, when you need it.  We recommend signing up for the patient portal called "MyChart".  Sign up information is provided on this After Visit Summary.  MyChart is used to connect with patients for Virtual Visits (Telemedicine).  Patients are able to view lab/test results, encounter notes, upcoming appointments, etc.  Non-urgent messages can be sent to your provider as well.   To learn more about what you can do with MyChart, go to [email or web address].    Your next appointment:   6 month(s)  The format for your next appointment:   In Person  Provider:   Kate Sable, MD   Other Instructions

## 2020-11-12 ENCOUNTER — Encounter: Payer: Self-pay | Admitting: Family Medicine

## 2020-11-12 ENCOUNTER — Ambulatory Visit (INDEPENDENT_AMBULATORY_CARE_PROVIDER_SITE_OTHER): Payer: Medicare HMO | Admitting: Family Medicine

## 2020-11-12 ENCOUNTER — Other Ambulatory Visit: Payer: Self-pay

## 2020-11-12 DIAGNOSIS — M79601 Pain in right arm: Secondary | ICD-10-CM | POA: Diagnosis not present

## 2020-11-12 DIAGNOSIS — E119 Type 2 diabetes mellitus without complications: Secondary | ICD-10-CM | POA: Diagnosis not present

## 2020-11-12 DIAGNOSIS — F32A Depression, unspecified: Secondary | ICD-10-CM

## 2020-11-12 DIAGNOSIS — I10 Essential (primary) hypertension: Secondary | ICD-10-CM

## 2020-11-12 DIAGNOSIS — F419 Anxiety disorder, unspecified: Secondary | ICD-10-CM | POA: Diagnosis not present

## 2020-11-12 LAB — POCT GLYCOSYLATED HEMOGLOBIN (HGB A1C): Hemoglobin A1C: 7.1 % — AB (ref 4.0–5.6)

## 2020-11-12 MED ORDER — TETANUS-DIPHTH-ACELL PERTUSSIS 5-2.5-18.5 LF-MCG/0.5 IM SUSP: 0.5000 mL | Freq: Once | INTRAMUSCULAR | 0 refills | Status: AC

## 2020-11-12 MED ORDER — OZEMPIC (0.25 OR 0.5 MG/DOSE) 2 MG/1.5ML ~~LOC~~ SOPN: PEN_INJECTOR | SUBCUTANEOUS | 1 refills | Status: DC

## 2020-11-12 NOTE — Assessment & Plan Note (Signed)
Borderline low blood pressure.  We will have her discontinue the HCTZ.  She will follow up in 1 to 2 weeks.

## 2020-11-12 NOTE — Progress Notes (Signed)
[name], MD Phone: [phone]  Valerie Vazquez is a 65 y.o. female who presents today for f/u.  Right arm edema/pain: Patient was evaluated in the emergency room.  She was determined not to have a clot.  She had been on Eliquis as prescribed.  They started on HCTZ and the swelling did improve some.  She followed up with cardiology and they stopped her carvedilol and Lasix as she was on the HCTZ.  She notes chronic intermittent issues with exertional shortness of breath.  She has intermittent sharp chest pains that have been going on for quite some time that occur sporadically and only when she is sitting.  She saw cardiology last week.  Since she noticed the arm swelling and pain she notes her bicep has not been as big.  The pain radiates from her bicep up to her shoulder.  Depression: Patient does note some depression.  She has found that she has been hoarding things and it bothers her to think about getting rid of the stuff as her room would be empty.  She notes no SI.  She continues on Zyprexa and Lexapro.  [facility] did not insurance.  She does have a Financial risk analyst with a therapist coming up.  Diabetes: Not checking sugars.  She has taken [place].  She does note some polydipsia.  No hypoglycemia.  Lightheadedness: Patient notes lightheadedness on standing since she started on the HCTZ.  She was previously on carvedilol and furosemide though that was stopped by cardiology.  She notes a history of hypertension though recently blood pressures have been adequately controlled.  Social History   Tobacco Use  Smoking Status Former Smoker  . Packs/day: 1.50  . Years: 10.00  . Pack years: 15.00  . Types: Cigarettes  . Quit date: 04/12/1979  . Years since quitting: 41.6  Smokeless Tobacco Never Used    Current Outpatient Medications on File Prior to Visit  Medication Sig Dispense Refill  . acetaminophen (TYLENOL) 500 MG tablet Take 1 tablet (500 mg total) by mouth every 6  (six) hours as needed. 30 tablet 0  . baclofen (LIORESAL) 10 MG tablet TAKE 1 TABLET BY MOUTH THREE TIMES A DAY AS NEEDED FOR MUSCLE SPASMS 60 tablet 1  . cetirizine (ZYRTEC) 10 MG tablet TAKE 1 TABLET BY MOUTH EVERY DAY 90 tablet 3  . dicyclomine (BENTYL) 20 MG tablet TAKE 1 TABLET BY MOUTH THREE TIMES A DAY AS NEEDED FOR MUSCLE SPASMS 270 tablet 0  . diphenhydrAMINE (BENADRYL) 25 MG tablet Take 1 tablet (25 mg total) by mouth every 6 (six) hours. 12 tablet 0  . ELIQUIS 5 MG TABS tablet TAKE 1 TABLET BY MOUTH TWICE A DAY 60 tablet 5  . escitalopram (LEXAPRO) 20 MG tablet TAKE 1 TABLET BY MOUTH EVERY DAY 90 tablet 1  . estradiol (ESTRACE) 0.1 MG/GM vaginal cream Dispose applicator, use pea size amount 3 times a week at night 42.5 g 12  . famotidine (PEPCID) 20 MG tablet TAKE 1 TABLET BY MOUTH EVERYDAY AT BEDTIME. 90 tablet 1  . furosemide (LASIX) 20 MG tablet TAKE 1 TABLET (20 MG TOTAL) BY MOUTH DAILY AS NEEDED FOR EDEMA. 90 tablet 1  . gabapentin (NEURONTIN) 600 MG tablet TAKE 1 TABLET IN THE AM AND 1.5 TABLETS AT BEDTIME 225 tablet 1  . isosorbide mononitrate (IMDUR) 30 MG 24 hr tablet Take 30 mg by mouth daily.    Marland Kitchen JARDIANCE 25 MG TABS tablet TAKE 1 TABLET BY MOUTH EVERY DAY  90 tablet 1  . KLOR-CON M20 20 MEQ tablet TAKE 1 TABLET (20 MEQ TOTAL) BY MOUTH DAILY AS NEEDED (TAKE WITH LASIX). 90 tablet 1  . levothyroxine (SYNTHROID) 25 MCG tablet TAKE 1 TABLET BY MOUTH BEFORE BREAKFAST 2-3 HOURS BEFORE TAKING ANY OTHER MEDICATION OR SUPPLEMENT 90 tablet 3  . lidocaine (LIDODERM) 5 % Place 1 patch onto the skin daily. Remove & Discard patch within 12 hours. 30 patch 0  . Multiple Vitamins-Minerals (SUPER THERA VITE M) TABS Take by mouth.    . nitroGLYCERIN (NITROSTAT) 0.4 MG SL tablet Place 1 tablet (0.4 mg total) under the tongue every 5 (five) minutes as needed for chest pain. 25 tablet 4  . OLANZapine (ZYPREXA) 10 MG tablet TAKE 1 TABLET BY MOUTH EVERYDAY AT BEDTIME 90 tablet 0  . ondansetron  (ZOFRAN) 4 MG tablet TAKE 1 TABLET (4 MG TOTAL) BY MOUTH 2 (TWO) TIMES DAILY AS NEEDED FOR NAUSEA OR VOMITING. 30 tablet 0  . pantoprazole (PROTONIX) 40 MG tablet TAKE 1 TABLET BY MOUTH EVERY DAY 90 tablet 1  . rosuvastatin (CRESTOR) 40 MG tablet TAKE 1 TABLET BY MOUTH EVERY DAY 90 tablet 1   No current facility-administered medications on file prior to visit.     ROS see history of present illness  Objective  Physical Exam Vitals:   11/12/20 1323  BP: 90/60  Pulse: (!) 103  Temp: 98.8 F (37.1 C)  SpO2: 97%    BP Readings from Last 3 Encounters:  11/12/20 90/60  11/09/20 120/70  11/07/20 (!) 150/98   Wt Readings from Last 3 Encounters:  11/12/20 216 lb (98 kg)  11/09/20 223 lb (101.2 kg)  11/07/20 222 lb (100.7 kg)    Physical Exam Constitutional:      General: She is not in acute distress.    Appearance: She is not diaphoretic.  Cardiovascular:     Rate and Rhythm: Normal rate and regular rhythm.     Heart sounds: Normal heart sounds.  Pulmonary:     Effort: Pulmonary effort is normal.     Breath sounds: Normal breath sounds.  Musculoskeletal:     Comments: Right shoulder is nontender, there is tenderness over the distal portion of her right bicep, there is no evidence of a tendon bicep tear, she has 5/5 strength bilateral biceps, triceps, and grip, possible minimal swelling in her right arm distally  Skin:    General: Skin is warm and dry.  Neurological:     Mental Status: She is alert.      Assessment/Plan: Please see individual problem list.  Problem List Items Addressed This Visit    Anxiety and depression    Patient does have depression.  She is going to talk to a therapist I think that is a great idea.  For now she will continue on Zyprexa and Lexapro.  We will see if we can get her referred to an another psychiatrist as well given that she is on Zyprexa.      Relevant Orders   Ambulatory referral to Psychiatry   Diabetes mellitus without  complication ([place])    B7J remains above goal.  Discussed changing Januvia to Ozempic.  She will let us know if this is not affordable.  She denies a personal and family history of thyroid cancer, parathyroid cancer, and adrenal gland cancer.  She was advised to let us know if she develops any thyroid enlargement or anterior throat issues as well as nausea or abdominal pain.  If the  Ozempic is not affordable we could refer her to our clinical pharmacist.      Relevant Medications   Semaglutide,0.25 or 0.5MG /DOS, (OZEMPIC, 0.25 OR 0.5 MG/DOSE,) 2 MG/1.5ML SOPN   Other Relevant Orders   POCT HgB A1C (Completed)   Essential hypertension    Borderline low blood pressure.  We will have her discontinue the HCTZ.  She will follow up in 1 to 2 weeks.      Right arm pain    Possibly with a bicep strain or tear based on exam and history.  Work-up was negative for DVT.  I do not think this is swelling related to fluid overload.  We will get her referred to orthopedics.      Relevant Orders   Ambulatory referral to Orthopedic Surgery       Health maintenance: Patient declines COVID-19 vaccine, pneumonia vaccine, and flu vaccine.  She was encouraged to see her eye doctor.  Tetanus vaccine sent to pharmacy.  Patient is aware to go to the pharmacy to get this.   This visit occurred during the SARS-CoV-2 public health emergency.  Safety protocols were in place, including screening questions prior to the visit, additional usage of staff PPE, and extensive cleaning of exam room while observing appropriate contact time as indicated for disinfecting solutions.    [name], MD [place]

## 2020-11-12 NOTE — Assessment & Plan Note (Addendum)
A1c remains above goal.  Discussed changing Januvia to Ozempic.  She will let us know if this is not affordable.  She denies a personal and family history of thyroid cancer, parathyroid cancer, and adrenal gland cancer.  She was advised to let us know if she develops any thyroid enlargement or anterior throat issues as well as nausea or abdominal pain.  If the Ozempic is not affordable we could refer her to our clinical pharmacist.

## 2020-11-12 NOTE — Patient Instructions (Signed)
Nice to see you. Please stop the hydrochlorothiazide.  If you develop swelling you can take a tablet of the furosemide as needed once daily.  I will see you back in about a week to recheck your blood pressure. I referred you to psychiatry and orthopedics.  If they do not contact you please let us know.

## 2020-11-12 NOTE — Assessment & Plan Note (Signed)
Possibly with a bicep strain or tear based on exam and history.  Work-up was negative for DVT.  I do not think this is swelling related to fluid overload.  We will get her referred to orthopedics.

## 2020-11-12 NOTE — Assessment & Plan Note (Signed)
Patient does have depression.  She is going to talk to a therapist I think that is a great idea.  For now she will continue on Zyprexa and Lexapro.  We will see if we can get her referred to an another psychiatrist as well given that she is on Zyprexa.

## 2020-11-14 DIAGNOSIS — F423 Hoarding disorder: Secondary | ICD-10-CM | POA: Diagnosis not present

## 2020-11-15 DIAGNOSIS — M5412 Radiculopathy, cervical region: Secondary | ICD-10-CM | POA: Insufficient documentation

## 2020-11-19 ENCOUNTER — Other Ambulatory Visit: Payer: Self-pay

## 2020-11-19 ENCOUNTER — Ambulatory Visit: Payer: Medicare HMO

## 2020-11-20 DIAGNOSIS — F423 Hoarding disorder: Secondary | ICD-10-CM | POA: Diagnosis not present

## 2020-11-20 DIAGNOSIS — F330 Major depressive disorder, recurrent, mild: Secondary | ICD-10-CM | POA: Diagnosis not present

## 2020-11-20 DIAGNOSIS — F410 Panic disorder [episodic paroxysmal anxiety] without agoraphobia: Secondary | ICD-10-CM | POA: Diagnosis not present

## 2020-11-22 ENCOUNTER — Ambulatory Visit: Payer: Medicare HMO | Admitting: Gastroenterology

## 2020-11-22 ENCOUNTER — Other Ambulatory Visit: Payer: Self-pay

## 2020-11-22 VITALS — BP 125/82 | HR 89 | Ht 66.0 in | Wt 219.0 lb

## 2020-11-22 DIAGNOSIS — R9389 Abnormal findings on diagnostic imaging of other specified body structures: Secondary | ICD-10-CM

## 2020-11-22 DIAGNOSIS — Z8659 Personal history of other mental and behavioral disorders: Secondary | ICD-10-CM | POA: Diagnosis not present

## 2020-11-22 NOTE — Progress Notes (Signed)
Valerie Bellows MD, MRCP(U.K) [address]  [place], [place] [address]  Main: [phone]  Fax: [phone]   Primary Care Physician: [name], MD  Primary Gastroenterologist:  Dr. Jonathon Vazquez   Abnormality seen on CAT scan   HPI: Valerie Vazquez is a 65 y.o. female   Summary of history : Previously been seen for constipation -IBS-, GERD,elavated GGT in 11/2017(felt secondary to excess tyelnol use)and dysphagia. Constipation resolved with Rx and dysphagia resolved with PPI  EGD 02/17/17-normal ( at the time of endoscopy she had no dysphagia) Colonoscopy 02/17/17- 6 mm serrated adenoma resected.CT abdomen in 09/2016 showed no abnormalities of the liver . 02/16/2019: H pylori stool antigen negative 02/18/2019: CT abdomen- small b/l effusions, thickeneing of the distal gastrioc antrum and 3rd portion of the duodenum.  03/07/2019: EGD: gastric bx - gastritis S/p cholecystectomy in 2002. 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  09/19/2020 : Underwent a CT scan for hematuria work up and was found to incidentally have Mild wall thickening with adjacent inflammation involving the sigmoid colon, which may represent a mild infectious or inflammatory colitis.  She denies any constipation at this point of time and initially mentioned that she had diarrhea subsequently she confessed that she has been using multiple and the mouth daily for many years and feels she may be suffering from bulimia.  Denies any recent antibiotic use denies any use of any artificial sweeteners or any other laxatives.  Denies any blood in stool denies any weight loss denies any abdominal pain denies any NSAID use.  Current Outpatient Medications  Medication Sig Dispense Refill  . acetaminophen  (TYLENOL) 500 MG tablet Take 1 tablet (500 mg total) by mouth every 6 (six) hours as needed. 30 tablet 0  . baclofen (LIORESAL) 10 MG tablet TAKE 1 TABLET BY MOUTH THREE TIMES A DAY AS NEEDED FOR MUSCLE SPASMS 60 tablet 1  . cetirizine (ZYRTEC) 10 MG tablet TAKE 1 TABLET BY MOUTH EVERY DAY 90 tablet 3  . dicyclomine (BENTYL) 20 MG tablet TAKE 1 TABLET BY MOUTH THREE TIMES A DAY AS NEEDED FOR MUSCLE SPASMS 270 tablet 0  . diphenhydrAMINE (BENADRYL) 25 MG tablet Take 1 tablet (25 mg total) by mouth every 6 (six) hours. 12 tablet 0  . ELIQUIS 5 MG TABS tablet TAKE 1 TABLET BY MOUTH TWICE A DAY 60 tablet 5  . escitalopram (LEXAPRO) 20 MG tablet TAKE 1 TABLET BY MOUTH EVERY DAY 90 tablet 1  . estradiol (ESTRACE) 0.1 MG/GM vaginal cream Dispose applicator, use pea size amount 3 times a week at night 42.5 g 12  . famotidine (PEPCID) 20 MG tablet TAKE 1 TABLET BY MOUTH EVERYDAY AT BEDTIME. 90 tablet 1  . furosemide (LASIX) 20 MG tablet TAKE 1 TABLET (20 MG TOTAL) BY MOUTH DAILY AS NEEDED FOR EDEMA. 90 tablet 1  . gabapentin (NEURONTIN) 600 MG tablet TAKE 1 TABLET IN THE AM AND 1.5 TABLETS AT BEDTIME 225 tablet 1  . isosorbide mononitrate (IMDUR) 30 MG 24 hr tablet Take 30 mg by mouth daily.    Marland Kitchen JARDIANCE 25 MG TABS tablet TAKE 1 TABLET BY MOUTH EVERY DAY 90 tablet 1  . KLOR-CON M20 20 MEQ tablet TAKE 1 TABLET (20 MEQ TOTAL) BY MOUTH DAILY AS NEEDED (TAKE WITH LASIX). 90 tablet 1  .  levothyroxine (SYNTHROID) 25 MCG tablet TAKE 1 TABLET BY MOUTH BEFORE BREAKFAST 2-3 HOURS BEFORE TAKING ANY OTHER MEDICATION OR SUPPLEMENT 90 tablet 3  . lidocaine (LIDODERM) 5 % Place 1 patch onto the skin daily. Remove & Discard patch within 12 hours. 30 patch 0  . Multiple Vitamins-Minerals (SUPER THERA VITE M) TABS Take by mouth.    . nitroGLYCERIN (NITROSTAT) 0.4 MG SL tablet Place 1 tablet (0.4 mg total) under the tongue every 5 (five) minutes as needed for chest pain. 25 tablet 4  . OLANZapine (ZYPREXA) 10 MG tablet TAKE  1 TABLET BY MOUTH EVERYDAY AT BEDTIME 90 tablet 0  . ondansetron (ZOFRAN) 4 MG tablet TAKE 1 TABLET (4 MG TOTAL) BY MOUTH 2 (TWO) TIMES DAILY AS NEEDED FOR NAUSEA OR VOMITING. 30 tablet 0  . pantoprazole (PROTONIX) 40 MG tablet TAKE 1 TABLET BY MOUTH EVERY DAY 90 tablet 1  . rosuvastatin (CRESTOR) 40 MG tablet TAKE 1 TABLET BY MOUTH EVERY DAY 90 tablet 1  . Semaglutide,0.25 or 0.5MG /DOS, (OZEMPIC, 0.25 OR 0.5 MG/DOSE,) 2 MG/1.5ML SOPN Inject 0.25 mg into the skin once a week for 28 days, THEN 0.5 mg once a week. 4.5 mL 1   No current facility-administered medications for this visit.    Allergies as of 11/22/2020 - Review Complete 11/22/2020  Allergen Reaction Noted  . Abilify [aripiprazole] Other (See Comments) 09/20/2013  . Augmentin [amoxicillin-pot clavulanate] Diarrhea and Nausea And Vomiting 09/20/2013  . Bactrim [sulfamethoxazole-trimethoprim] Diarrhea 09/20/2013  . Ceftin [cefuroxime axetil] Diarrhea 09/20/2013  . Coconut oil  10/19/2015  . Diclofenac Other (See Comments) 08/01/2014  . Dye fdc red [red dye]  01/22/2016  . Indocin [indomethacin] Other (See Comments) 09/20/2013  . Linaclotide Diarrhea 12/09/2014  . Lisinopril Diarrhea and Other (See Comments) 09/20/2013  . Morphine Other (See Comments) 12/09/2014  . Morphine and related Other (See Comments) 09/20/2013  . Simvastatin Other (See Comments) 08/01/2014  . Sulfa antibiotics Diarrhea 12/09/2014  . Sulfamethoxazole-trimethoprim Diarrhea 12/09/2014  . Wellbutrin [bupropion] Other (See Comments) 09/20/2013  . Zetia [ezetimibe]  09/18/2017  . Quetiapine fumarate Palpitations 08/01/2014  . Quetiapine fumarate Palpitations 08/16/2014    ROS:  General: Negative for anorexia, weight loss, fever, chills, fatigue, weakness. ENT: Negative for hoarseness, difficulty swallowing , nasal congestion. CV: Negative for chest pain, angina, palpitations, dyspnea on exertion, peripheral edema.  Respiratory: Negative for dyspnea at  rest, dyspnea on exertion, cough, sputum, wheezing.  GI: See history of present illness. GU:  Negative for dysuria, hematuria, urinary incontinence, urinary frequency, nocturnal urination.  Endo: Negative for unusual weight change.    Physical Examination:   BP 125/82   Pulse 89   Ht 5\' 6"  (1.676 m)   Wt 219 lb (99.3 kg)   BMI 35.35 kg/m   General: Well-nourished, well-developed in no acute distress.  Eyes: No icterus. Conjunctivae pink. Mouth: Oropharyngeal mucosa moist and pink , no lesions erythema or exudate. Extremities: No lower extremity edema. No clubbing or deformities. Neuro: Alert and oriented x 3.  Grossly intact. Skin: Warm and dry, no jaundice.   Psych: Alert and cooperative, normal mood and affect.   Imaging Studies: US Venous Img Upper Uni Right(DVT)  Result Date: 11/07/2020 CLINICAL DATA:  Right upper extremity pain and edema. History of diabetes and DVT/pulmonary embolism. Former smoker. EXAM: RIGHT UPPER EXTREMITY VENOUS DOPPLER ULTRASOUND TECHNIQUE: Gray-scale sonography with graded compression, as well as color Doppler and duplex ultrasound were performed to evaluate the upper extremity deep venous system from the level of  the subclavian vein and including the jugular, axillary, basilic, radial, ulnar and upper cephalic vein. Spectral Doppler was utilized to evaluate flow at rest and with distal augmentation maneuvers. COMPARISON:  Right upper extremity venous Doppler ultrasound-06/09/2014 (negative). FINDINGS: Contralateral Subclavian Vein: Respiratory phasicity is normal and symmetric with the symptomatic side. No evidence of thrombus. Normal compressibility. Internal Jugular Vein: No evidence of thrombus. Normal compressibility, respiratory phasicity and response to augmentation. Subclavian Vein: No evidence of thrombus. Normal compressibility, respiratory phasicity and response to augmentation. Axillary Vein: No evidence of thrombus. Normal compressibility,  respiratory phasicity and response to augmentation. Cephalic Vein: No evidence of thrombus. Normal compressibility, respiratory phasicity and response to augmentation. Basilic Vein: No evidence of thrombus. Normal compressibility, respiratory phasicity and response to augmentation. Brachial Veins: No evidence of thrombus. Normal compressibility, respiratory phasicity and response to augmentation. Radial Veins: No evidence of thrombus. Normal compressibility, respiratory phasicity and response to augmentation. Ulnar Veins: No evidence of thrombus. Normal compressibility, respiratory phasicity and response to augmentation. Venous Reflux:  None visualized. Other Findings:  None visualized. IMPRESSION: No evidence of DVT within the right upper extremity. Electronically Signed   By: Sandi Mariscal M.D.   On: 11/07/2020 15:28    Assessment and Plan:   Valerie Vazquez is a 64 y.o. y/o female  here to see me today for an "inflammed colon " which was incidentally noted on a CAT scan performed for work-up of hematuria. She has been seen by me previously in 2020 for  for abdominal pain felt due to IBS-C .  During the office visit she confessed that she has been using multiple enemas daily for many years and feels she may be suffering from bulimia.  I explained that these enemas may be causing inflammation of the colon and could be dangerous to her in terms of electrolyte imbalance.  I suggested that she stop doing the enemas and restart the Linzess which was given previously which she has really not tried recently to take for her IBS constipation.  I will also try and make a referral to psychiatry to help her with the bulimia.     Dr Valerie Bellows  MD,MRCP [facility]) Follow up in 4 to 6 weeks office visit.

## 2020-11-22 NOTE — Addendum Note (Signed)
Addended by: Dorethea Clan on: 11/22/2020 04:17 PM   Modules accepted: Orders

## 2020-11-27 ENCOUNTER — Encounter (INDEPENDENT_AMBULATORY_CARE_PROVIDER_SITE_OTHER): Payer: Self-pay

## 2020-11-28 ENCOUNTER — Telehealth: Payer: Self-pay | Admitting: Family Medicine

## 2020-11-28 NOTE — Telephone Encounter (Signed)
lft vm for pt to call ofc regarding referral.

## 2020-12-04 ENCOUNTER — Telehealth: Payer: Self-pay | Admitting: Family Medicine

## 2020-12-04 DIAGNOSIS — F423 Hoarding disorder: Secondary | ICD-10-CM | POA: Diagnosis not present

## 2020-12-04 DIAGNOSIS — F330 Major depressive disorder, recurrent, mild: Secondary | ICD-10-CM | POA: Diagnosis not present

## 2020-12-04 NOTE — Telephone Encounter (Signed)
Arby Barrette from [facility] psychiatrist call about patient her contact number is [phone] [phone]

## 2020-12-05 DIAGNOSIS — M542 Cervicalgia: Secondary | ICD-10-CM | POA: Diagnosis not present

## 2020-12-07 ENCOUNTER — Ambulatory Visit (INDEPENDENT_AMBULATORY_CARE_PROVIDER_SITE_OTHER): Payer: Medicare HMO | Admitting: Family Medicine

## 2020-12-07 ENCOUNTER — Encounter: Payer: Self-pay | Admitting: Family Medicine

## 2020-12-07 ENCOUNTER — Other Ambulatory Visit: Payer: Self-pay

## 2020-12-07 DIAGNOSIS — I10 Essential (primary) hypertension: Secondary | ICD-10-CM | POA: Diagnosis not present

## 2020-12-07 DIAGNOSIS — R059 Cough, unspecified: Secondary | ICD-10-CM | POA: Diagnosis not present

## 2020-12-07 MED ORDER — FLUTICASONE PROPIONATE 50 MCG/ACT NA SUSP: 2.0000 | Freq: Every day | NASAL | 6 refills | Status: DC

## 2020-12-07 MED ORDER — AZELASTINE HCL 0.1 % NA SOLN: 2.0000 | Freq: Two times a day (BID) | NASAL | 1 refills | Status: DC

## 2020-12-07 NOTE — Assessment & Plan Note (Signed)
Patient's blood pressure has improved since discontinuing HCTZ.  She will monitor at home and watch for recurrent lightheadedness.

## 2020-12-07 NOTE — Progress Notes (Signed)
Virtual Visit via telephone Note  This visit type was conducted due to national recommendations for restrictions regarding the COVID-19 pandemic (e.g. social distancing).  This format is felt to be most appropriate for this patient at this time.  All issues noted in this document were discussed and addressed.  No physical exam was performed (except for noted visual exam findings with Video Visits).   I connected with Valerie Vazquez today at  1:15 PM EDT by telephone and verified that I am speaking with the correct person using two identifiers. Location patient: car Location provider: work Persons participating in the virtual visit: patient, provider  I discussed the limitations, risks, security and privacy concerns of performing an evaluation and management service by telephone and the availability of in person appointments. I also discussed with the patient that there may be a patient responsible charge related to this service. The patient expressed understanding and agreed to proceed.  Interactive audio and video telecommunications were attempted between this provider and patient, however failed, due to patient having technical difficulties OR patient did not have access to video capability.  We continued and completed visit with audio only.   Reason for visit: f/u.  HPI: Patient presented for in person follow-up today.  Upon entering the room I asked if she was feeling better regarding her lightheadedness.  She noted she was feeling lightheaded today because she was up all night coughing.  She has postnasal drip.  She wonders if it is allergies.  At this point I advised her that I could not be in the room with her as I did not pass my fit testing for an N95.  The visit was transition to a telephone visit.  Patient reports cough onset yesterday with postnasal drip.  Some sneezing.  Some mild congestion.  Some minimal sensation of wheezing.  No shortness of breath.  Cough is nonproductive.   Has not had any fevers.  No COVID-19 exposures.  No taste or smell disturbances.  Does report a history of allergies.  Hypertension: She discontinued hydrochlorothiazide.  Not consistently checking her blood pressure.  She is on Imdur 30 mg once daily.  No chest pain or shortness of breath.  Her lightheadedness has significantly improved since coming off of hydrochlorothiazide.   ROS: See pertinent positives and negatives per HPI.  Past Medical History:  Diagnosis Date  . Acute right-sided low back pain with right-sided sciatica 10/23/2017  . Anemia   . Bilateral renal cysts   . CAD (coronary artery disease) 09/28/2016  . Chest pain 10/14/2015  . Chronic back pain    "upper and lower" (09/19/2014)  . Chronic shoulder pain (Location of Primary Source of Pain) (Left) 01/29/2016  . Colitis 04/08/2016  . Coronary vasospasm ([place]) 12/09/2014  . Depression    "major" (09/19/2014)  . DVT (deep venous thrombosis) ([place])   . Esophageal spasm   . Essential hypertension 10/28/2013  . Gallstones   . Gastritis   . Gastroenteritis   . GERD (gastroesophageal reflux disease)   . Headache    "couple /month lately" (09/19/2014)  . Heart disease 12/09/2016  . High blood pressure   . High cholesterol   . History of blood transfusion 1985   related to hysterectomy  . History of nuclear stress test    Myoview 1/19:  EF 65, no ischemia or scar  . History of pulmonary embolus (PE) 09/19/2014  . Hx of cervical spine surgery 01/29/2016  . 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 Rule out Paget's disease of the bone with blood work.   . Hyperlipidemia 10/28/2013  . Hypertension   . IBS (irritable bowel syndrome)   . Lumbar central spinal stenosis (moderate at L4-5; mild at L2-3 and L3-4) 01/29/2016  . Migraine    "a few times/yr" (09/19/2014)  . Myocardial infarction ([place]) 10/2010 X 3   "while hospitalized"  . Neck pain 01/29/2016  . Osteoarthritis    "qwhere; mostly around my joints"  (09/19/2014)  . Pleural effusion, bilateral 07/31/2015  . Pneumonia 04/2014  . PTSD (post-traumatic stress disorder)   . Pulmonary embolism ([place]) 04/2001; 09/19/2014   "after gallbladder OR; "  . Schizoaffective disorder ([place])   . Schizoaffective disorder, depressive type ([place]) 08/28/2016  . Sleep apnea    "mild" (09/19/2014)  . Snoring    a. sleep study 5/16: No OSA  . SOB (shortness of breath) 07/31/2015  . Spinal stenosis   . Spondylosis   . Suicide attempt ([place]) 08/27/2016  . Type II diabetes mellitus ([place])    "dx'd in 2007; lost weight; no RX for ~ 4 yr now" (09/19/2014)  . Wheezing 02/25/2017    Past Surgical History:  Procedure Laterality Date  . ABDOMINAL HYSTERECTOMY  1985  . ANTERIOR CERVICAL DECOMP/DISCECTOMY FUSION  10/2012   in [place] C5-C7   . APPENDECTOMY  1985  . BACK SURGERY    . BREAST CYST EXCISION Right   . BREAST EXCISIONAL BIOPSY Right YRS AGO   NEG  . CARDIAC CATHETERIZATION   2000's; 2009  . CHOLECYSTECTOMY  2002  . COLONOSCOPY WITH PROPOFOL N/A 12/12/2016   Procedure: COLONOSCOPY WITH PROPOFOL;  Surgeon: [name], MD;  Location: [facility] ENDOSCOPY;  Service: Endoscopy;  Laterality: N/A;  . COLONOSCOPY WITH PROPOFOL N/A 02/17/2017   Procedure: COLONOSCOPY WITH PROPOFOL;  Surgeon: [name], MD;  Location: [facility] ENDOSCOPY;  Service: Gastroenterology;  Laterality: N/A;  . ESOPHAGOGASTRODUODENOSCOPY (EGD) WITH PROPOFOL N/A 02/17/2017   Procedure: ESOPHAGOGASTRODUODENOSCOPY (EGD) WITH PROPOFOL;  Surgeon: [name], MD;  Location: [facility] ENDOSCOPY;  Service: Gastroenterology;  Laterality: N/A;  . ESOPHAGOGASTRODUODENOSCOPY (EGD) WITH PROPOFOL N/A 03/07/2019   Procedure: ESOPHAGOGASTRODUODENOSCOPY (EGD) WITH PROPOFOL;  Surgeon: [name], MD;  Location: [facility] ENDOSCOPY;  Service: Gastroenterology;  Laterality: N/A;  . EXCISION/RELEASE BURSA HIP Right 12/1984  . HERNIA REPAIR  1985  . LEFT HEART CATHETERIZATION WITH CORONARY ANGIOGRAM N/A 12/09/2014   Procedure: LEFT  HEART CATHETERIZATION WITH CORONARY ANGIOGRAM;  Surgeon: Burnell Blanks, MD;  Location: [facility] CATH LAB;  Service: Cardiovascular;  Laterality: N/A;  . TONSILLECTOMY AND ADENOIDECTOMY  ~ 1968  . TOTAL HIP ARTHROPLASTY Left 04-06-2014  . UMBILICAL HERNIA REPAIR  1985  . VENA CAVA FILTER PLACEMENT  03/2014    Family History  Problem Relation Age of Onset  . Stroke Mother        Deceased  . Lung cancer Father        Deceased  . Healthy Daughter   . Healthy Son        x 2  . Other Son        Suicide  . Cancer Brother   . Heart attack Neg Hx     SOCIAL HX: Former smoker   Current Outpatient Medications:  .  acetaminophen (TYLENOL) 500 MG tablet, Take 1 tablet (500 mg total) by mouth every 6 (six) hours as needed., Disp: 30 tablet, Rfl: 0 .  azelastine (ASTELIN) 0.1 % nasal spray, Place 2 sprays into both nostrils 2 (two) times daily.  Use in each nostril as directed, Disp: 30 mL, Rfl: 1 .  baclofen (LIORESAL) 10 MG tablet, TAKE 1 TABLET BY MOUTH THREE TIMES A DAY AS NEEDED FOR MUSCLE SPASMS, Disp: 60 tablet, Rfl: 1 .  cetirizine (ZYRTEC) 10 MG tablet, TAKE 1 TABLET BY MOUTH EVERY DAY, Disp: 90 tablet, Rfl: 3 .  dicyclomine (BENTYL) 20 MG tablet, TAKE 1 TABLET BY MOUTH THREE TIMES A DAY AS NEEDED FOR MUSCLE SPASMS, Disp: 270 tablet, Rfl: 0 .  diphenhydrAMINE (BENADRYL) 25 MG tablet, Take 1 tablet (25 mg total) by mouth every 6 (six) hours., Disp: 12 tablet, Rfl: 0 .  ELIQUIS 5 MG TABS tablet, TAKE 1 TABLET BY MOUTH TWICE A DAY, Disp: 60 tablet, Rfl: 5 .  estradiol (ESTRACE) 0.1 MG/GM vaginal cream, Dispose applicator, use pea size amount 3 times a week at night, Disp: 42.5 g, Rfl: 12 .  famotidine (PEPCID) 20 MG tablet, TAKE 1 TABLET BY MOUTH EVERYDAY AT BEDTIME., Disp: 90 tablet, Rfl: 1 .  fluticasone (FLONASE) 50 MCG/ACT nasal spray, Place 2 sprays into both nostrils daily., Disp: 16 g, Rfl: 6 .  furosemide (LASIX) 20 MG tablet, TAKE 1 TABLET (20 MG TOTAL) BY MOUTH DAILY AS NEEDED  FOR EDEMA., Disp: 90 tablet, Rfl: 1 .  gabapentin (NEURONTIN) 600 MG tablet, TAKE 1 TABLET IN THE AM AND 1.5 TABLETS AT BEDTIME, Disp: 225 tablet, Rfl: 1 .  isosorbide mononitrate (IMDUR) 30 MG 24 hr tablet, Take 30 mg by mouth daily., Disp: , Rfl:  .  JARDIANCE 25 MG TABS tablet, TAKE 1 TABLET BY MOUTH EVERY DAY, Disp: 90 tablet, Rfl: 1 .  KLOR-CON M20 20 MEQ tablet, TAKE 1 TABLET (20 MEQ TOTAL) BY MOUTH DAILY AS NEEDED (TAKE WITH LASIX)., Disp: 90 tablet, Rfl: 1 .  levothyroxine (SYNTHROID) 25 MCG tablet, TAKE 1 TABLET BY MOUTH BEFORE BREAKFAST 2-3 HOURS BEFORE TAKING ANY OTHER MEDICATION OR SUPPLEMENT, Disp: 90 tablet, Rfl: 3 .  lidocaine (LIDODERM) 5 %, Place 1 patch onto the skin daily. Remove & Discard patch within 12 hours., Disp: 30 patch, Rfl: 0 .  Multiple Vitamins-Minerals (SUPER THERA VITE M) TABS, Take by mouth., Disp: , Rfl:  .  nitroGLYCERIN (NITROSTAT) 0.4 MG SL tablet, Place 1 tablet (0.4 mg total) under the tongue every 5 (five) minutes as needed for chest pain., Disp: 25 tablet, Rfl: 4 .  OLANZapine (ZYPREXA) 10 MG tablet, TAKE 1 TABLET BY MOUTH EVERYDAY AT BEDTIME, Disp: 90 tablet, Rfl: 0 .  ondansetron (ZOFRAN) 4 MG tablet, TAKE 1 TABLET (4 MG TOTAL) BY MOUTH 2 (TWO) TIMES DAILY AS NEEDED FOR NAUSEA OR VOMITING., Disp: 30 tablet, Rfl: 0 .  pantoprazole (PROTONIX) 40 MG tablet, TAKE 1 TABLET BY MOUTH EVERY DAY, Disp: 90 tablet, Rfl: 1 .  rosuvastatin (CRESTOR) 40 MG tablet, TAKE 1 TABLET BY MOUTH EVERY DAY, Disp: 90 tablet, Rfl: 1 .  Semaglutide,0.25 or 0.5MG /DOS, (OZEMPIC, 0.25 OR 0.5 MG/DOSE,) 2 MG/1.5ML SOPN, Inject 0.25 mg into the skin once a week for 28 days, THEN 0.5 mg once a week., Disp: 4.5 mL, Rfl: 1  EXAM: This was a telephone visit and thus no physical exam was completed.  ASSESSMENT AND PLAN:  Discussed the following assessment and plan:  Problem List Items Addressed This Visit    Cough    This potentially could be related to allergies though given that it is  an acute symptom it could be related to COVID-19.  She will be swabbed for COVID-19 today.  I  counseled that she needs to remain quarantined at home until we get the test result back.  She is given return precautions in her AVS.  She can trial Flonase and/or Astelin for allergy symptoms.      Relevant Medications   fluticasone (FLONASE) 50 MCG/ACT nasal spray   azelastine (ASTELIN) 0.1 % nasal spray   Other Relevant Orders   Novel Coronavirus, NAA ([facility])   Essential hypertension    Patient's blood pressure has improved since discontinuing HCTZ.  She will monitor at home and watch for recurrent lightheadedness.          I discussed the assessment and treatment plan with the patient. The patient was provided an opportunity to ask questions and all were answered. The patient agreed with the plan and demonstrated an understanding of the instructions.   The patient was advised to call back or seek an in-person evaluation if the symptoms worsen or if the condition fails to improve as anticipated.  I provided 11 minutes of non-face-to-face time during this encounter.   [name], MD

## 2020-12-07 NOTE — Assessment & Plan Note (Addendum)
This potentially could be related to allergies though given that it is an acute symptom it could be related to COVID-19.  She will be swabbed for COVID-19 today.  I counseled that she needs to remain quarantined at home until we get the test result back.  She is given return precautions in her AVS.  She can trial Flonase and/or Astelin for allergy symptoms.

## 2020-12-07 NOTE — Patient Instructions (Addendum)
Nice to see you. Please stay quarantined at home until we get the test result back on your COVID-19 swab. We will try Flonase for the possible allergy component. If you develop shortness of breath, chest pain, or worsening cough symptoms please be evaluated in person.

## 2020-12-07 NOTE — Progress Notes (Deleted)
[name], MD Phone: [phone]  Valerie Vazquez is a 65 y.o. female who presents today for f/u.      Social History   Tobacco Use  Smoking Status Former Smoker  . Packs/day: 1.50  . Years: 10.00  . Pack years: 15.00  . Types: Cigarettes  . Quit date: 04/12/1979  . Years since quitting: 41.6  Smokeless Tobacco Never Used    Current Outpatient Medications on File Prior to Visit  Medication Sig Dispense Refill  . acetaminophen (TYLENOL) 500 MG tablet Take 1 tablet (500 mg total) by mouth every 6 (six) hours as needed. 30 tablet 0  . baclofen (LIORESAL) 10 MG tablet TAKE 1 TABLET BY MOUTH THREE TIMES A DAY AS NEEDED FOR MUSCLE SPASMS 60 tablet 1  . cetirizine (ZYRTEC) 10 MG tablet TAKE 1 TABLET BY MOUTH EVERY DAY 90 tablet 3  . dicyclomine (BENTYL) 20 MG tablet TAKE 1 TABLET BY MOUTH THREE TIMES A DAY AS NEEDED FOR MUSCLE SPASMS 270 tablet 0  . diphenhydrAMINE (BENADRYL) 25 MG tablet Take 1 tablet (25 mg total) by mouth every 6 (six) hours. 12 tablet 0  . ELIQUIS 5 MG TABS tablet TAKE 1 TABLET BY MOUTH TWICE A DAY 60 tablet 5  . escitalopram (LEXAPRO) 20 MG tablet TAKE 1 TABLET BY MOUTH EVERY DAY 90 tablet 1  . estradiol (ESTRACE) 0.1 MG/GM vaginal cream Dispose applicator, use pea size amount 3 times a week at night 42.5 g 12  . famotidine (PEPCID) 20 MG tablet TAKE 1 TABLET BY MOUTH EVERYDAY AT BEDTIME. 90 tablet 1  . furosemide (LASIX) 20 MG tablet TAKE 1 TABLET (20 MG TOTAL) BY MOUTH DAILY AS NEEDED FOR EDEMA. 90 tablet 1  . gabapentin (NEURONTIN) 600 MG tablet TAKE 1 TABLET IN THE AM AND 1.5 TABLETS AT BEDTIME 225 tablet 1  . isosorbide mononitrate (IMDUR) 30 MG 24 hr tablet Take 30 mg by mouth daily.    Marland Kitchen JARDIANCE 25 MG TABS tablet TAKE 1 TABLET BY MOUTH EVERY DAY 90 tablet 1  . KLOR-CON M20 20 MEQ tablet TAKE 1 TABLET (20 MEQ TOTAL) BY MOUTH DAILY AS NEEDED (TAKE WITH LASIX). 90 tablet 1  . levothyroxine (SYNTHROID) 25 MCG tablet TAKE 1 TABLET BY MOUTH BEFORE  BREAKFAST 2-3 HOURS BEFORE TAKING ANY OTHER MEDICATION OR SUPPLEMENT 90 tablet 3  . lidocaine (LIDODERM) 5 % Place 1 patch onto the skin daily. Remove & Discard patch within 12 hours. 30 patch 0  . Multiple Vitamins-Minerals (SUPER THERA VITE M) TABS Take by mouth.    . nitroGLYCERIN (NITROSTAT) 0.4 MG SL tablet Place 1 tablet (0.4 mg total) under the tongue every 5 (five) minutes as needed for chest pain. 25 tablet 4  . OLANZapine (ZYPREXA) 10 MG tablet TAKE 1 TABLET BY MOUTH EVERYDAY AT BEDTIME 90 tablet 0  . ondansetron (ZOFRAN) 4 MG tablet TAKE 1 TABLET (4 MG TOTAL) BY MOUTH 2 (TWO) TIMES DAILY AS NEEDED FOR NAUSEA OR VOMITING. 30 tablet 0  . pantoprazole (PROTONIX) 40 MG tablet TAKE 1 TABLET BY MOUTH EVERY DAY 90 tablet 1  . rosuvastatin (CRESTOR) 40 MG tablet TAKE 1 TABLET BY MOUTH EVERY DAY 90 tablet 1  . Semaglutide,0.25 or 0.5MG /DOS, (OZEMPIC, 0.25 OR 0.5 MG/DOSE,) 2 MG/1.5ML SOPN Inject 0.25 mg into the skin once a week for 28 days, THEN 0.5 mg once a week. 4.5 mL 1   No current facility-administered medications on file prior to visit.     ROS see history of  present illness  Objective  Physical Exam There were no vitals filed for this visit.  BP Readings from Last 3 Encounters:  11/22/20 125/82  11/12/20 90/60  11/09/20 120/70   Wt Readings from Last 3 Encounters:  11/22/20 219 lb (99.3 kg)  11/12/20 216 lb (98 kg)  11/09/20 223 lb (101.2 kg)    Physical Exam Constitutional:      General: She is not in acute distress. Pulmonary:     Effort: Pulmonary effort is normal.  Neurological:     Mental Status: She is alert.      Assessment/Plan: Please see individual problem list.  Problem List Items Addressed This Visit    Cough    This potentially could be related to allergies though given that it is an acute symptom it could be related to COVID-19.  She will be swabbed for COVID-19 today.  I counseled that she needs to remain quarantined at home until we get the  test result back.      Relevant Medications   fluticasone (FLONASE) 50 MCG/ACT nasal spray   Other Relevant Orders   Novel Coronavirus, NAA ([facility])   Essential hypertension    Patient's blood pressure has improved since discontinuing HCTZ.  She will monitor at home and watch for recurrent lightheadedness.          Health Maintenance: ***     This visit occurred during the SARS-CoV-2 public health emergency.  Safety protocols were in place, including screening questions prior to the visit, additional usage of staff PPE, and extensive cleaning of exam room while observing appropriate contact time as indicated for disinfecting solutions.    [name], MD [place]

## 2020-12-09 ENCOUNTER — Encounter: Payer: Self-pay | Admitting: Family Medicine

## 2020-12-09 LAB — SARS-COV-2, NAA 2 DAY TAT

## 2020-12-09 LAB — NOVEL CORONAVIRUS, NAA: SARS-CoV-2, NAA: DETECTED — AB

## 2020-12-10 ENCOUNTER — Telehealth: Payer: Self-pay | Admitting: Adult Health

## 2020-12-10 ENCOUNTER — Telehealth: Payer: Self-pay

## 2020-12-10 DIAGNOSIS — F422 Mixed obsessional thoughts and acts: Secondary | ICD-10-CM | POA: Diagnosis not present

## 2020-12-10 NOTE — Telephone Encounter (Signed)
Called to discuss with patient about COVID-19 symptoms and the use of one of the available treatments for those with mild to moderate Covid symptoms and at a high risk of hospitalization.  Pt appears to qualify for outpatient treatment due to co-morbid conditions and/or a member of an at-risk group in accordance with the FDA Emergency Use Authorization.    Symptom onset: 12/06/20 Qualifiers: HTN, CAD I reviewed recommendations for Bebtelovimab for IV infusion. She would like to think about treatment and will call me if interested.   Scot Dock

## 2020-12-10 NOTE — Telephone Encounter (Signed)
Called to discuss with patient about COVID-19 symptoms and the use of one of the available treatments for those with mild to moderate Covid symptoms and at a high risk of hospitalization.  Pt appears to qualify for outpatient treatment due to co-morbid conditions and/or a member of an at-risk group in accordance with the FDA Emergency Use Authorization.    Symptom onset: Cough 12/06/20 Vaccinated: No Booster? No Immunocompromised? No Qualifiers: HTN,CAD Pt. Would like to speak with APP. Valerie Vazquez

## 2020-12-12 NOTE — Progress Notes (Signed)
Called patient and scheduled a f/up per provider.  Ly Bacchi,cma

## 2020-12-13 NOTE — Telephone Encounter (Signed)
Patient also reached out to oncology. See message below:   Gardenia Phlegm, NP     12/10/20 3:01 PM Note Called todiscuss with patient about COVID-19 symptoms and the use of one of the available treatments for those with mild to moderate Covid symptoms and at a high risk of hospitalization. Pt appears to qualify for outpatient treatment due to co-morbid conditions and/or a member of an at-risk group in accordance with the FDA Emergency Use Authorization.   Symptom onset: 12/06/20 Qualifiers: HTN, CAD I reviewed recommendations for Bebtelovimab for IV infusion. She would like to think about treatment and will call me if interested.   Scot Dock

## 2020-12-14 ENCOUNTER — Other Ambulatory Visit: Payer: Self-pay | Admitting: Family Medicine

## 2020-12-14 ENCOUNTER — Other Ambulatory Visit: Payer: Self-pay | Admitting: Internal Medicine

## 2020-12-14 DIAGNOSIS — E785 Hyperlipidemia, unspecified: Secondary | ICD-10-CM

## 2020-12-14 DIAGNOSIS — E119 Type 2 diabetes mellitus without complications: Secondary | ICD-10-CM

## 2020-12-14 DIAGNOSIS — F250 Schizoaffective disorder, bipolar type: Secondary | ICD-10-CM

## 2020-12-14 DIAGNOSIS — R110 Nausea: Secondary | ICD-10-CM

## 2020-12-17 ENCOUNTER — Encounter: Payer: Self-pay | Admitting: Podiatry

## 2020-12-17 ENCOUNTER — Other Ambulatory Visit: Payer: Self-pay

## 2020-12-18 DIAGNOSIS — F422 Mixed obsessional thoughts and acts: Secondary | ICD-10-CM | POA: Diagnosis not present

## 2020-12-20 ENCOUNTER — Ambulatory Visit: Payer: Medicare HMO | Admitting: Gastroenterology

## 2020-12-20 ENCOUNTER — Ambulatory Visit: Payer: Medicare HMO | Admitting: Podiatry

## 2020-12-20 ENCOUNTER — Encounter: Payer: Self-pay | Admitting: Podiatry

## 2020-12-20 ENCOUNTER — Other Ambulatory Visit: Payer: Self-pay

## 2020-12-20 DIAGNOSIS — L03031 Cellulitis of right toe: Secondary | ICD-10-CM | POA: Diagnosis not present

## 2020-12-20 DIAGNOSIS — E119 Type 2 diabetes mellitus without complications: Secondary | ICD-10-CM

## 2020-12-20 NOTE — Progress Notes (Signed)
This patient returns to the office for at risk foot care.  Stated that she had an ingrowing toenail on the outside border of the big toe of the right foot.  .    She says she saw pus a few days ago and has been soaking her toe in dawn.    She says her toe has become painful and red walking and wearing her shoe on the outside border of the right big toe.  She requests nail surgery for the correction of this problem.  She previously had nail surgery and the nail has regrown .  Patient is diabetic with no complications.  Vascular  Dorsalis pedis and posterior tibial pulses are palpable  B/L.  Capillary return  WNL.  Temperature gradient is  WNL.  Skin turgor  WNL  Sensorium  Senn Weinstein monofilament wire  WNL. Normal tactile sensation.  Nail Exam  Patient has normal nails with no evidence of bacterial or fungal infection.  Mild redness and swelling along the lateral border right hallux.  Orthopedic  Exam  Muscle tone and muscle strength  WNL.  No limitations of motion feet  B/L.  No crepitus or joint effusion noted.  Foot type is unremarkable and digits show no abnormalities. Mild  HAV  B/L.  ADV fifth toe  B/L  Skin  No open lesions.  Normal skin texture and turgor.  Paronychia lateral border right hallux.  ROV.  Nail surgery.  Treatment options and alternatives discussed.  Recommended permanent phenol matrixectomy and patient agreed. Right hallux  was prepped with alcohol and a toe block of 3cc of 2% lidocaine plain was administered in a digital toe block. .  The toe was then prepped with betadine solution . A tourniquet was applied to toe. The offending nail border was then excised and matrix tissue exposed.  Phenol was then applied to the matrix tissue followed by an alcohol wash.  Antibiotic ointment and a dry sterile dressing was applied.  The patient was dispensed instructions for aftercare. RTC 7 days      Gardiner Barefoot DPM

## 2020-12-20 NOTE — Patient Instructions (Signed)

## 2020-12-21 DIAGNOSIS — F423 Hoarding disorder: Secondary | ICD-10-CM | POA: Diagnosis not present

## 2020-12-25 DIAGNOSIS — F422 Mixed obsessional thoughts and acts: Secondary | ICD-10-CM | POA: Diagnosis not present

## 2020-12-26 IMAGING — CT CT ABDOMEN AND PELVIS WITH CONTRAST
1 of 3 series · 14 of 32 positions shown, 19 images · IV contrast (APPLIED)
Comparison: 09/29/2018

CLINICAL DATA: Epigastric pain, generalized upper abdominal pain,
prior history of cholecystectomy, hysterectomy, appendectomy, hip
replacement, IVC filter, coronary artery disease post MI,
hypertension, type II diabetes mellitus, pulmonary embolism

EXAM:
CT ABDOMEN AND PELVIS WITH CONTRAST
TECHNIQUE: Multidetector CT imaging of the abdomen and pelvis was performed
using the standard protocol following bolus administration of
intravenous contrast. Sagittal and coronal MPR images reconstructed
from axial data set.
CONTRAST:  100mL OMNIPAQUE IOHEXOL 300 MG/ML  SOLN

[Series 2: axial (person_name) (person_name) · axial · 0.76mm/px · z∈[-782,-362]mm · 14 of 94 slices shown, 19 images]
[im 5/94  soft-tissue]
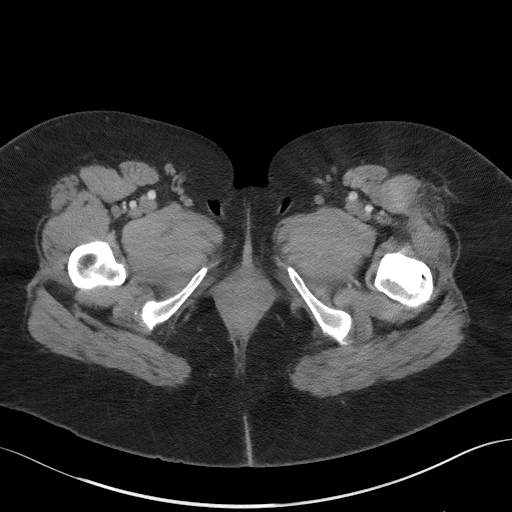
[im 5/94  bone]
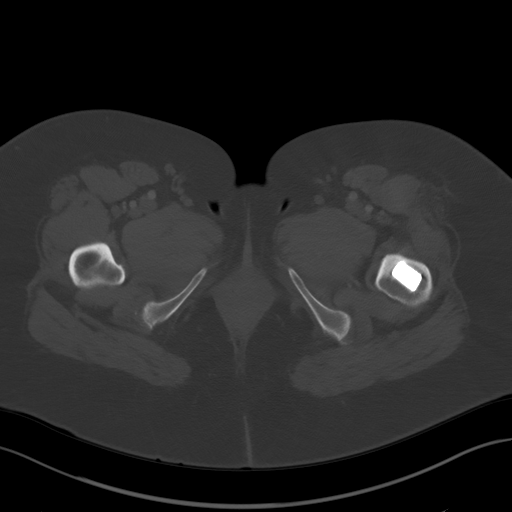
[im 15/94  soft-tissue]
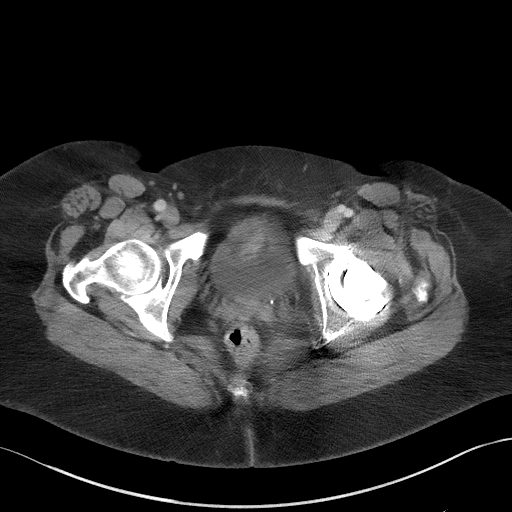
[im 20/94  soft-tissue]
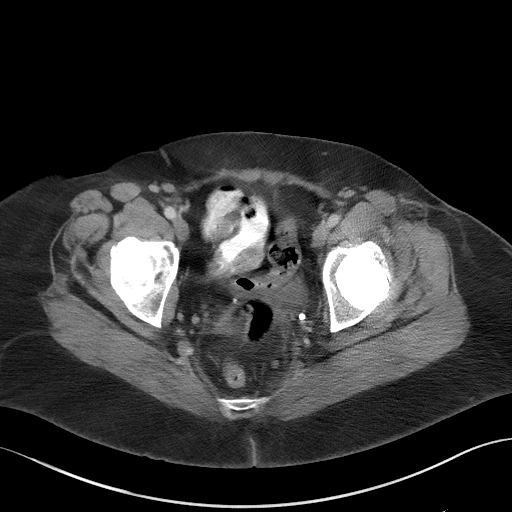
[im 25/94  soft-tissue]
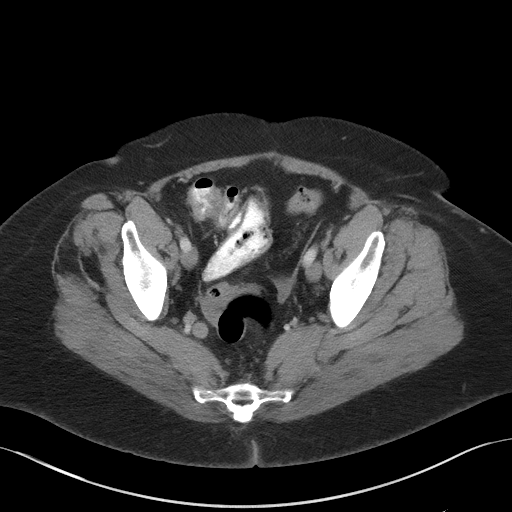
[im 35/94  soft-tissue]
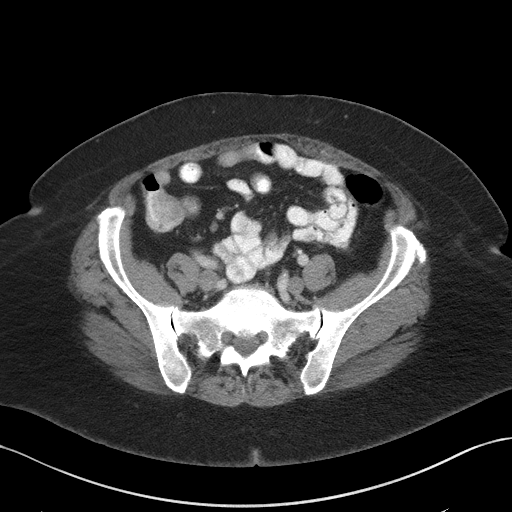
[im 40/94  soft-tissue]
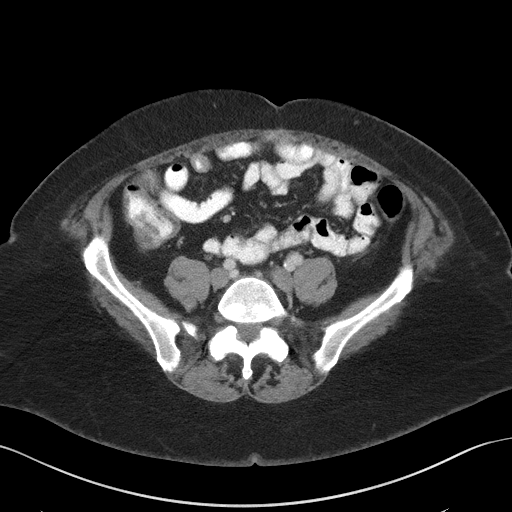
[im 49/94  soft-tissue]
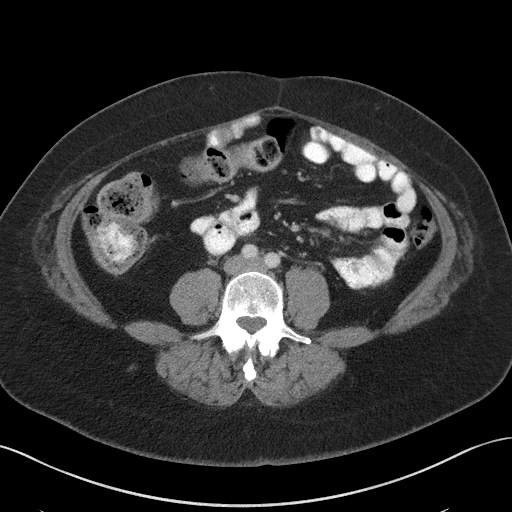
[im 54/94  soft-tissue]
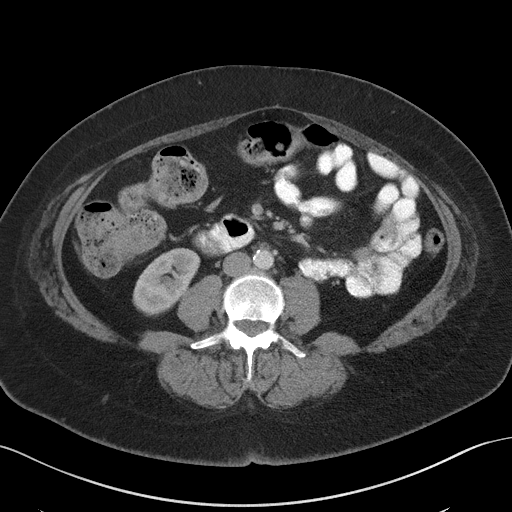
[im 59/94  soft-tissue]
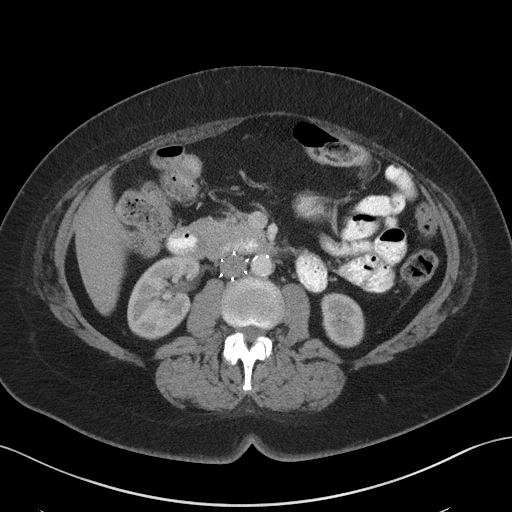
[im 59/94  bone]
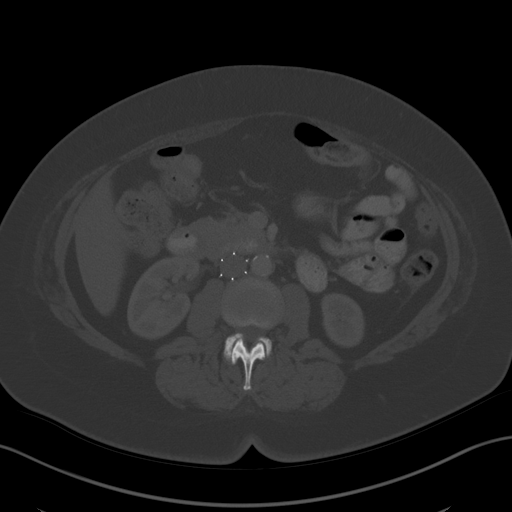
[im 69/94  soft-tissue]
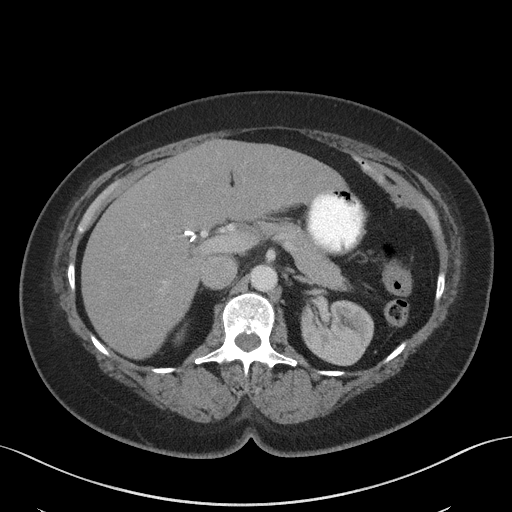
[im 74/94  soft-tissue]
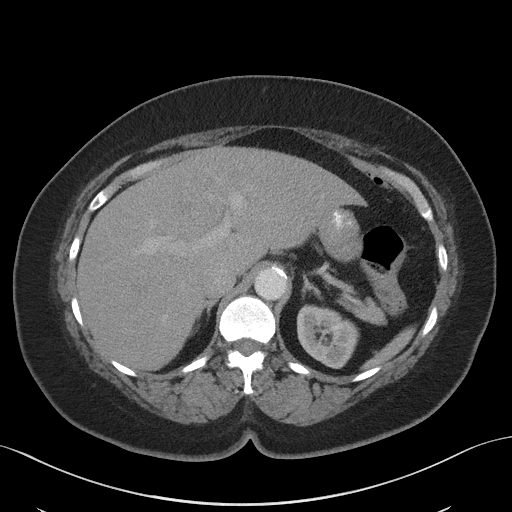
[im 74/94  lung]
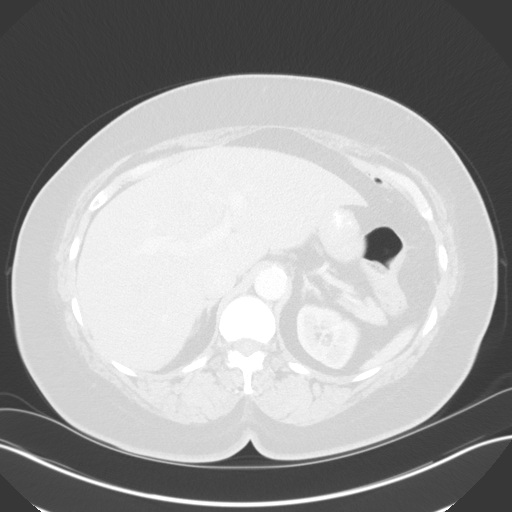
[im 79/94  soft-tissue]
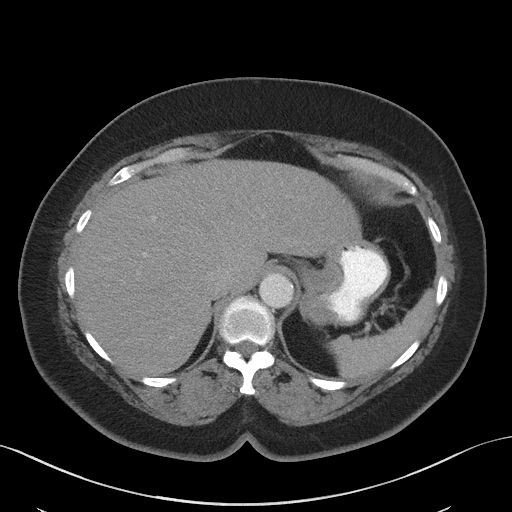
[im 79/94  lung]
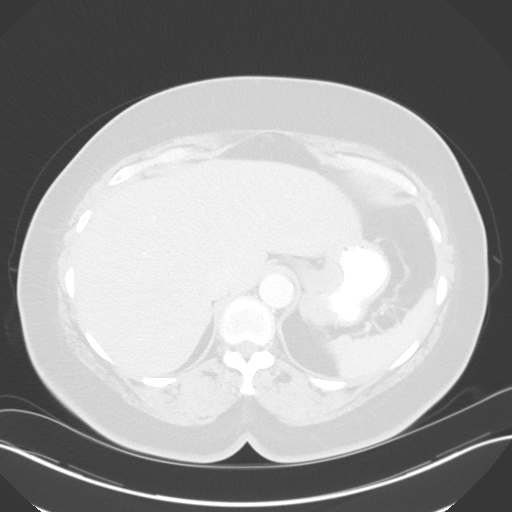
[im 84/94  lung]
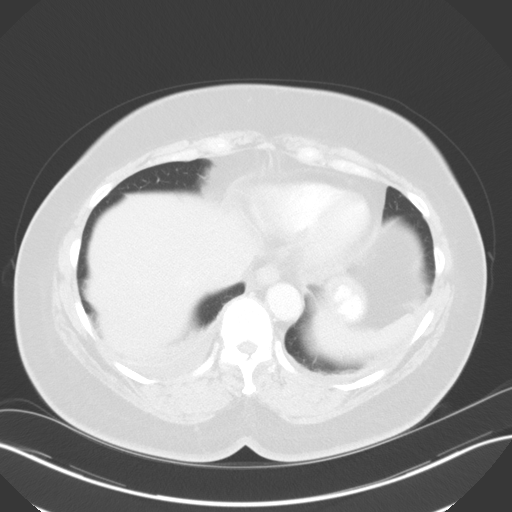
[im 89/94  soft-tissue]
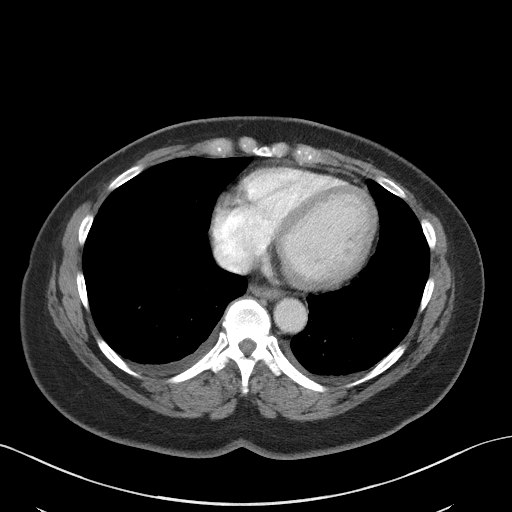
[im 89/94  lung]
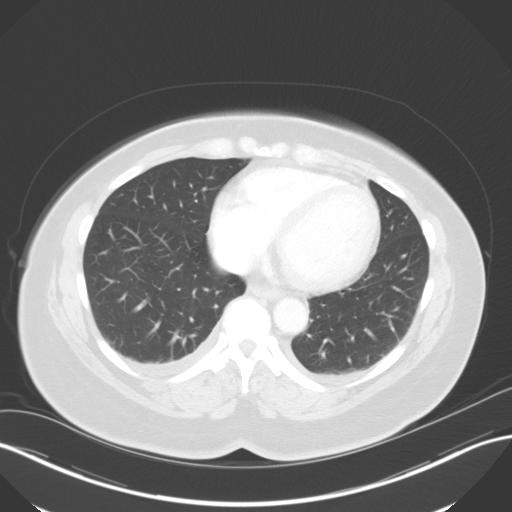

[14 of 32 positions shown; findings below may reference images not displayed]

FINDINGS: Lower chest: Small BILATERAL pleural effusions with minimal adjacent
atelectasis of the lower lobes

Hepatobiliary: Gallbladder surgically absent. Liver normal
appearance

Pancreas: Normal appearance

Spleen: Normal appearance

Adrenals/Urinary Tract: Adrenal glands, kidneys, ureters, and
bladder normal appearance

Stomach/Bowel: Appendix surgically absent by history. Thickened
distal gastric antrum and pylorus. Thickening of the second and
proximal third portions of the duodenum. Findings are suggestive of
distal gastritis and duodenitis. No discrete ulcer. Bowel loops
otherwise normal appearance.

Vascular/Lymphatic: Atherosclerotic calcifications of aorta and
iliac arteries without aneurysm. Mechanical filter identified within
IVC, tip at the level of the LEFT renal vein confluence, without
significant wall penetration of limbs. IVC grossly patent. No
adenopathy.

Reproductive: Uterus surgically absent. Small cysts in the ovaries
bilaterally unchanged.

Other: No free air or free fluid. No hernia or inflammatory process.

Musculoskeletal: No acute osseous findings.
IMPRESSION: Small BILATERAL pleural effusions.

Stable small cysts within the ovaries.

Thickening of distal gastric antrum/pylorus through proximal third
portion of duodenum compatible with gastritis/duodenitis.

## 2020-12-27 ENCOUNTER — Ambulatory Visit: Payer: Medicare HMO | Admitting: Podiatry

## 2020-12-27 DIAGNOSIS — M542 Cervicalgia: Secondary | ICD-10-CM | POA: Diagnosis not present

## 2020-12-31 ENCOUNTER — Ambulatory Visit: Payer: Self-pay | Admitting: Psychiatry

## 2021-01-01 ENCOUNTER — Telehealth: Payer: Self-pay | Admitting: Family Medicine

## 2021-01-01 ENCOUNTER — Other Ambulatory Visit: Payer: Self-pay

## 2021-01-01 ENCOUNTER — Ambulatory Visit: Payer: Medicare HMO | Admitting: Podiatry

## 2021-01-01 DIAGNOSIS — L03031 Cellulitis of right toe: Secondary | ICD-10-CM | POA: Diagnosis not present

## 2021-01-01 DIAGNOSIS — M542 Cervicalgia: Secondary | ICD-10-CM | POA: Diagnosis not present

## 2021-01-01 NOTE — Telephone Encounter (Signed)
lft vm at [facility] for a follow up call regarding referral. thanks

## 2021-01-01 NOTE — Progress Notes (Signed)
Subjective: 65 y.o. female presents today status post permanent nail avulsion procedure of the lateral border right great toe that was performed on 12/20/2020 by Dr. Prudence Davidson here in the office.  Patient states that she is doing well.  She still has some associated tenderness.  She presents for follow-up treatment and evaluation  Past Medical History:  Diagnosis Date  . Acute right-sided low back pain with right-sided sciatica 10/23/2017  . Anemia   . Bilateral renal cysts   . CAD (coronary artery disease) 09/28/2016  . Chest pain 10/14/2015  . Chronic back pain    "upper and lower" (09/19/2014)  . Chronic shoulder pain (Location of Primary Source of Pain) (Left) 01/29/2016  . Colitis 04/08/2016  . Coronary vasospasm ([place]) 12/09/2014  . Depression    "major" (09/19/2014)  . DVT (deep venous thrombosis) ([place])   . Esophageal spasm   . Essential hypertension 10/28/2013  . Gallstones   . Gastritis   . Gastroenteritis   . GERD (gastroesophageal reflux disease)   . Headache    "couple /month lately" (09/19/2014)  . Heart disease 12/09/2016  . High blood pressure   . High cholesterol   . History of blood transfusion 1985   related to hysterectomy  . History of nuclear stress test    Myoview 1/19:  EF 65, no ischemia or scar  . History of pulmonary embolus (PE) 09/19/2014  . Hx of cervical spine surgery 01/29/2016  . 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 Rule out Paget's disease of the bone with blood work.   . Hyperlipidemia 10/28/2013  . Hypertension   . IBS (irritable bowel syndrome)   . Lumbar central spinal stenosis (moderate at L4-5; mild at L2-3 and L3-4) 01/29/2016  . Migraine    "a few times/yr" (09/19/2014)  . Myocardial infarction ([place]) 10/2010 X 3   "while hospitalized"  . Neck pain 01/29/2016  . Osteoarthritis    "qwhere; mostly around my joints" (09/19/2014)  . Pleural effusion, bilateral 07/31/2015  . Pneumonia 04/2014  . PTSD (post-traumatic stress  disorder)   . Pulmonary embolism ([place]) 04/2001; 09/19/2014   "after gallbladder OR; "  . Schizoaffective disorder ([place])   . Schizoaffective disorder, depressive type ([place]) 08/28/2016  . Sleep apnea    "mild" (09/19/2014)  . Snoring    a. sleep study 5/16: No OSA  . SOB (shortness of breath) 07/31/2015  . Spinal stenosis   . Spondylosis   . Suicide attempt ([place]) 08/27/2016  . Type II diabetes mellitus ([place])    "dx'd in 2007; lost weight; no RX for ~ 4 yr now" (09/19/2014)  . Wheezing 02/25/2017    Objective: Skin is warm, dry and supple. Nail and respective nail fold appears to be healing appropriately. Open wound to the associated nail fold with a granular wound base and moderate amount of fibrotic tissue. Minimal drainage noted. Mild erythema around the periungual region likely due to phenol chemical matricectomy.  Assessment: #1 postop permanent partial nail avulsion lateral border right great toe #2 open wound periungual nail fold of respective digit.   Plan of care: #1 patient was evaluated  #2 debridement of open wound was performed to the periungual border of the respective toe using a currette. Antibiotic ointment and Band-Aid was applied. #3 patient is to return to clinic on a PRN basis.   Edrick Kins, DPM [facility]  Dr. Edrick Kins, DPM    [address]  [place], [place] [address]                Office [phone]  Fax [phone]

## 2021-01-03 DIAGNOSIS — M542 Cervicalgia: Secondary | ICD-10-CM | POA: Diagnosis not present

## 2021-01-04 DIAGNOSIS — F422 Mixed obsessional thoughts and acts: Secondary | ICD-10-CM | POA: Diagnosis not present

## 2021-01-08 DIAGNOSIS — M542 Cervicalgia: Secondary | ICD-10-CM | POA: Diagnosis not present

## 2021-01-09 DIAGNOSIS — M542 Cervicalgia: Secondary | ICD-10-CM | POA: Diagnosis not present

## 2021-01-10 ENCOUNTER — Ambulatory Visit: Payer: Medicare HMO | Admitting: Podiatry

## 2021-01-10 ENCOUNTER — Other Ambulatory Visit: Payer: Self-pay

## 2021-01-10 ENCOUNTER — Encounter: Payer: Self-pay | Admitting: Podiatry

## 2021-01-10 DIAGNOSIS — M542 Cervicalgia: Secondary | ICD-10-CM | POA: Diagnosis not present

## 2021-01-10 DIAGNOSIS — Z09 Encounter for follow-up examination after completed treatment for conditions other than malignant neoplasm: Secondary | ICD-10-CM

## 2021-01-10 DIAGNOSIS — E119 Type 2 diabetes mellitus without complications: Secondary | ICD-10-CM

## 2021-01-14 ENCOUNTER — Encounter: Payer: Self-pay | Admitting: Family Medicine

## 2021-01-14 ENCOUNTER — Other Ambulatory Visit: Payer: Self-pay

## 2021-01-14 ENCOUNTER — Ambulatory Visit (INDEPENDENT_AMBULATORY_CARE_PROVIDER_SITE_OTHER): Payer: Medicare HMO | Admitting: Family Medicine

## 2021-01-14 ENCOUNTER — Other Ambulatory Visit: Payer: Self-pay | Admitting: Family Medicine

## 2021-01-14 DIAGNOSIS — F502 Bulimia nervosa: Secondary | ICD-10-CM | POA: Diagnosis not present

## 2021-01-14 DIAGNOSIS — F32A Depression, unspecified: Secondary | ICD-10-CM

## 2021-01-14 DIAGNOSIS — E119 Type 2 diabetes mellitus without complications: Secondary | ICD-10-CM

## 2021-01-14 DIAGNOSIS — G4700 Insomnia, unspecified: Secondary | ICD-10-CM | POA: Diagnosis not present

## 2021-01-14 DIAGNOSIS — F419 Anxiety disorder, unspecified: Secondary | ICD-10-CM

## 2021-01-14 DIAGNOSIS — F422 Mixed obsessional thoughts and acts: Secondary | ICD-10-CM | POA: Diagnosis not present

## 2021-01-14 MED ORDER — NITROGLYCERIN 0.4 MG SL SUBL: 0.4000 mg | SUBLINGUAL_TABLET | SUBLINGUAL | 4 refills | Status: DC | PRN

## 2021-01-14 MED ORDER — BLOOD GLUCOSE MONITOR KIT: PACK | 0 refills | Status: DC

## 2021-01-14 NOTE — Assessment & Plan Note (Signed)
I encouraged her to cut down on caffeine and eliminate this at least 6 hours prior to bedtime.  Discussed no screen time the hour before going to bed.  She can try melatonin 1 to 2 mg nightly about an hour before going to sleep.

## 2021-01-14 NOTE — Assessment & Plan Note (Addendum)
Chronic issues with this.  I encouraged her to contact psychiatry to [facility] to check on her status for an appointment.  For now she will continue Zyprexa 10 mg daily at bedtime and Lexapro 20 mg once daily.  I encouraged the patient to reach out to the police regarding her son if she is worried about him.

## 2021-01-14 NOTE — Assessment & Plan Note (Signed)
Her description seems consistent with bulimia.  She needs to see a psychiatrist for this.  She is already on a waiting list.  I encouraged her to follow with [facility] psychiatry on this.

## 2021-01-14 NOTE — Assessment & Plan Note (Signed)
Patient is not quite due for an A1c.  She will start checking her sugars at home.  She will continue Jardiance 25 mg once daily and Januvia 100 mg daily.

## 2021-01-14 NOTE — Patient Instructions (Signed)
Nice to see you. Please call [facility] psychiatry again to follow-up on an appointment. Please try melatonin 1 to 2 mg nightly about an hour before bedtime to see if that helps with your sleep. Please start checking your sugars in the morning before you eat anything.  I did send a glucometer to your pharmacy.  If this is too expensive please let us know.

## 2021-01-14 NOTE — Progress Notes (Signed)
[name], MD Phone: [phone]  Valerie Vazquez is a 65 y.o. female who presents today for f/u.  DIABETES Disease Monitoring: Blood Sugar ranges-not checking Polyuria/phagia/dipsia-yes      Optho- reports she saw them last year Medications: Compliance- taking jardiance and [place], she tried Ozempic though this resulted in low blood sugars.  She has not had any recurrent hypoglycemic episodes since stopping Ozempic.  Anxiety/depression/bulimia: Patient notes she was previously depressed.  She is trying to pull her self out of this.  She has an issue with hoarding.  She notes no current anxiety.  She does note she deals with bulimia.  She reports history of anorexia in the past though recently she feels as though food is the enemy and she will take an enema to combat this.  She notes no SI.  She is following with a therapist.  She is trying to get set up with psychiatry at [facility] and is on a waiting list.  She continues on Zyprexa and Lexapro.  The patient notes she is also worried about her son currently.  He left her home about a week ago and has not returned her calls.  She does note she has previously filed a missing persons report on him.  Insomnia: Patient notes this has been going on most of her life.  At least once a week she will have an issue where she will stay up all night.  It oftentimes takes several hours for her to fall asleep.  She notes her mind is just running.  She typically tries to go to bed between 9 and 10 PM.  She looks at her phone while trying to go to bed.  Notes she typically will wake up between noon and 1 PM.  She wakes up frequently throughout the night.  She currently is taking some Benadryl for this.  She does drink sweet tea throughout the day at times.  Denies alcohol intake.  History of COVID-19: She had COVID back in April.  She reports she has recovered fully from this.  Social History   Tobacco Use  Smoking Status Former Smoker  . Packs/day: 1.50   . Years: 10.00  . Pack years: 15.00  . Types: Cigarettes  . Quit date: 04/12/1979  . Years since quitting: 41.7  Smokeless Tobacco Never Used    Current Outpatient Medications on File Prior to Visit  Medication Sig Dispense Refill  . acetaminophen (TYLENOL) 500 MG tablet Take 1 tablet (500 mg total) by mouth every 6 (six) hours as needed. 30 tablet 0  . azelastine (ASTELIN) 0.1 % nasal spray Place 2 sprays into both nostrils 2 (two) times daily. Use in each nostril as directed 30 mL 1  . baclofen (LIORESAL) 10 MG tablet TAKE 1 TABLET BY MOUTH THREE TIMES A DAY AS NEEDED FOR MUSCLE SPASMS 60 tablet 1  . cetirizine (ZYRTEC) 10 MG tablet TAKE 1 TABLET BY MOUTH EVERY DAY 90 tablet 3  . dicyclomine (BENTYL) 20 MG tablet TAKE 1 TABLET BY MOUTH THREE TIMES A DAY AS NEEDED FOR MUSCLE SPASMS 270 tablet 0  . diphenhydrAMINE (BENADRYL) 25 MG tablet Take 1 tablet (25 mg total) by mouth every 6 (six) hours. 12 tablet 0  . ELIQUIS 5 MG TABS tablet TAKE 1 TABLET BY MOUTH TWICE A DAY 60 tablet 5  . escitalopram (LEXAPRO) 20 MG tablet Take 20 mg by mouth daily.    Marland Kitchen estradiol (ESTRACE) 0.1 MG/GM vaginal cream Dispose applicator, use pea size amount 3  times a week at night 42.5 g 12  . famotidine (PEPCID) 20 MG tablet TAKE 1 TABLET BY MOUTH EVERYDAY AT BEDTIME. 90 tablet 1  . fluticasone (FLONASE) 50 MCG/ACT nasal spray Place 2 sprays into both nostrils daily. 16 g 6  . furosemide (LASIX) 20 MG tablet TAKE 1 TABLET (20 MG TOTAL) BY MOUTH DAILY AS NEEDED FOR EDEMA. 90 tablet 1  . gabapentin (NEURONTIN) 600 MG tablet TAKE 1 TABLET IN THE AM AND 1.5 TABLETS AT BEDTIME 225 tablet 1  . isosorbide mononitrate (IMDUR) 30 MG 24 hr tablet Take 30 mg by mouth daily.    Marland Kitchen JARDIANCE 25 MG TABS tablet TAKE 1 TABLET BY MOUTH EVERY DAY 90 tablet 1  . KLOR-CON M20 20 MEQ tablet TAKE 1 TABLET (20 MEQ TOTAL) BY MOUTH DAILY AS NEEDED (TAKE WITH LASIX). 90 tablet 1  . levothyroxine (SYNTHROID) 25 MCG tablet TAKE 1 TABLET BY  MOUTH BEFORE BREAKFAST 2-3 HOURS BEFORE TAKING ANY OTHER MEDICATION OR SUPPLEMENT 90 tablet 3  . lidocaine (LIDODERM) 5 % Place 1 patch onto the skin daily. Remove & Discard patch within 12 hours. 30 patch 0  . Multiple Vitamins-Minerals (SUPER THERA VITE M) TABS Take by mouth.    . OLANZapine (ZYPREXA) 10 MG tablet TAKE 1 TABLET BY MOUTH EVERYDAY AT BEDTIME 90 tablet 0  . ondansetron (ZOFRAN) 4 MG tablet TAKE 1 TABLET (4 MG TOTAL) BY MOUTH 2 (TWO) TIMES DAILY AS NEEDED FOR NAUSEA OR VOMITING. 30 tablet 0  . pantoprazole (PROTONIX) 40 MG tablet TAKE 1 TABLET BY MOUTH EVERY DAY 90 tablet 1  . rosuvastatin (CRESTOR) 20 MG tablet TAKE 1 TABLET BY MOUTH EVERY DAY 90 tablet 1  . rosuvastatin (CRESTOR) 40 MG tablet TAKE 1 TABLET BY MOUTH EVERY DAY 90 tablet 1  . sitaGLIPtin (JANUVIA) 100 MG tablet Take 100 mg by mouth daily.     No current facility-administered medications on file prior to visit.     ROS see history of present illness  Objective  Physical Exam Vitals:   01/14/21 0854 01/14/21 0927  BP: 140/70 130/70  Pulse: 95   Temp: 98.5 F (36.9 C)   SpO2: 97%     BP Readings from Last 3 Encounters:  01/14/21 130/70  12/07/20 110/70  11/22/20 125/82   Wt Readings from Last 3 Encounters:  01/14/21 218 lb 3.2 oz (99 kg)  12/07/20 218 lb (98.9 kg)  11/22/20 219 lb (99.3 kg)    Physical Exam Constitutional:      General: She is not in acute distress.    Appearance: She is not diaphoretic.     Comments: Appears tired, avoids eye contact frequently throughout the visit  Cardiovascular:     Rate and Rhythm: Normal rate and regular rhythm.     Heart sounds: Normal heart sounds.  Pulmonary:     Effort: Pulmonary effort is normal.     Breath sounds: Normal breath sounds.  Skin:    General: Skin is warm and dry.  Neurological:     Mental Status: She is alert.  Psychiatric:     Comments: Affect is flat      Assessment/Plan: Please see individual problem list.  Problem  List Items Addressed This Visit    Insomnia    I encouraged her to cut down on caffeine and eliminate this at least 6 hours prior to bedtime.  Discussed no screen time the hour before going to bed.  She can try melatonin 1 to 2 mg  nightly about an hour before going to sleep.      Anxiety and depression    Chronic issues with this.  I encouraged her to contact psychiatry to [facility] to check on her status for an appointment.  For now she will continue Zyprexa 10 mg daily at bedtime and Lexapro 20 mg once daily.  I encouraged the patient to reach out to the police regarding her son if she is worried about him.      Relevant Medications   escitalopram (LEXAPRO) 20 MG tablet   Diabetes mellitus without complication ([place])    Patient is not quite due for an A1c.  She will start checking her sugars at home.  She will continue Jardiance 25 mg once daily and Januvia 100 mg daily.      Relevant Medications   sitaGLIPtin (JANUVIA) 100 MG tablet   Bulimia    Her description seems consistent with bulimia.  She needs to see a psychiatrist for this.  She is already on a waiting list.  I encouraged her to follow with [facility] psychiatry on this.         Return in about 6 weeks (around 02/25/2021) for Diabetes.  This visit occurred during the SARS-CoV-2 public health emergency.  Safety protocols were in place, including screening questions prior to the visit, additional usage of staff PPE, and extensive cleaning of exam room while observing appropriate contact time as indicated for disinfecting solutions.    [name], MD [place]

## 2021-01-16 ENCOUNTER — Other Ambulatory Visit: Payer: Self-pay | Admitting: Family Medicine

## 2021-01-22 DIAGNOSIS — M542 Cervicalgia: Secondary | ICD-10-CM | POA: Diagnosis not present

## 2021-01-23 DIAGNOSIS — F423 Hoarding disorder: Secondary | ICD-10-CM | POA: Diagnosis not present

## 2021-01-26 ENCOUNTER — Other Ambulatory Visit: Payer: Self-pay | Admitting: Family Medicine

## 2021-01-30 ENCOUNTER — Ambulatory Visit (INDEPENDENT_AMBULATORY_CARE_PROVIDER_SITE_OTHER): Payer: Medicare Other

## 2021-01-30 VITALS — Ht 66.0 in | Wt 215.2 lb

## 2021-01-30 DIAGNOSIS — Z0000 Encounter for general adult medical examination without abnormal findings: Secondary | ICD-10-CM

## 2021-01-30 NOTE — Progress Notes (Signed)
Subjective:   Valerie Vazquez is a 65 y.o. female who presents for Medicare Annual (Subsequent) preventive examination.  Review of Systems    No ROS.  Medicare Wellness Virtual Visit.  Visual/audio telehealth visit, UTA vital signs.   See social history for additional risk factors.   Cardiac Risk Factors include: advanced age (>90mn, >>12women);hypertension;diabetes mellitus     Objective:    Today's Vitals   01/30/21 1116  Weight: 215 lb 3.2 oz (97.6 kg)  Height: _0  (1.676 m)   Body mass index is 34.73 kg/m.  Advanced Directives 01/30/2021 11/07/2020 09/06/2020 08/10/2020 06/01/2020 03/01/2020 01/31/2020  Does Patient Have a Medical Advance Directive? _1  No No  Would patient like information on creating a medical advance directive? No - Patient declined - - - No - Patient declined No - Patient declined -  Some encounter information is confidential and restricted. Go to Review Flowsheets activity to see all data.    Current Medications (verified) Outpatient Encounter Medications as of 01/30/2021  Medication Sig  . acetaminophen (TYLENOL) 500 MG tablet Take 1 tablet (500 mg total) by mouth every 6 (six) hours as needed.  .Marland Kitchenazelastine (ASTELIN) 0.1 % nasal spray Place 2 sprays into both nostrils 2 (two) times daily. Use in each nostril as directed  . baclofen (LIORESAL) 10 MG tablet TAKE 1 TABLET BY MOUTH THREE TIMES A DAY AS NEEDED FOR MUSCLE SPASMS  . Blood Glucose Monitoring Suppl (GHT BLOOD GLUCOSE MONITOR) w/Device KIT DISPENSE BASED ON PATIENT AND INSURANCE PREFERENCE. USE ONCE DAILY AS DIRECTED. DX CODE E11.9.  .Marland Kitchencetirizine (ZYRTEC) 10 MG tablet TAKE 1 TABLET BY MOUTH EVERY DAY (Patient not taking: Reported on 01/30/2021)  . dicyclomine (BENTYL) 20 MG tablet TAKE 1 TABLET BY MOUTH THREE TIMES A DAY AS NEEDED FOR MUSCLE SPASMS  . diphenhydrAMINE (BENADRYL) 25 MG tablet Take 1 tablet (25 mg total) by mouth every 6 (six) hours.  .Marland KitchenELIQUIS 5 MG TABS tablet TAKE 1 TABLET  BY MOUTH TWICE A DAY  . escitalopram (LEXAPRO) 20 MG tablet TAKE 1 TABLET BY MOUTH EVERY DAY  . estradiol (ESTRACE) 0.1 MG/GM vaginal cream Dispose applicator, use pea size amount 3 times a week at night  . famotidine (PEPCID) 20 MG tablet TAKE 1 TABLET BY MOUTH EVERYDAY AT BEDTIME.  . fluticasone (FLONASE) 50 MCG/ACT nasal spray Place 2 sprays into both nostrils daily.  . furosemide (LASIX) 20 MG tablet TAKE 1 TABLET (20 MG TOTAL) BY MOUTH DAILY AS NEEDED FOR EDEMA.  .Marland Kitchengabapentin (NEURONTIN) 600 MG tablet TAKE 1 TABLET IN THE AM AND 1.5 TABLETS AT BEDTIME  . isosorbide mononitrate (IMDUR) 30 MG 24 hr tablet Take 30 mg by mouth daily.  .Marland KitchenJANUVIA 100 MG tablet TAKE 1 TABLET BY MOUTH EVERY DAY  . JARDIANCE 25 MG TABS tablet TAKE 1 TABLET BY MOUTH EVERY DAY  . KLOR-CON M20 20 MEQ tablet TAKE 1 TABLET (20 MEQ TOTAL) BY MOUTH DAILY AS NEEDED (TAKE WITH LASIX).  .Marland Kitchenlevothyroxine (SYNTHROID) 25 MCG tablet TAKE 1 TABLET BY MOUTH BEFORE BREAKFAST 2-3 HOURS BEFORE TAKING ANY OTHER MEDICATION OR SUPPLEMENT  . lidocaine (LIDODERM) 5 % Place 1 patch onto the skin daily. Remove & Discard patch within 12 hours.  . Multiple Vitamins-Minerals (SUPER THERA VITE M) TABS Take by mouth.  . nitroGLYCERIN (NITROSTAT) 0.4 MG SL tablet Place 1 tablet (0.4 mg total) under the tongue every 5 (five) minutes as needed for chest pain.  .Marland Kitchen  OLANZapine (ZYPREXA) 10 MG tablet TAKE 1 TABLET BY MOUTH EVERYDAY AT BEDTIME  . ondansetron (ZOFRAN) 4 MG tablet TAKE 1 TABLET (4 MG TOTAL) BY MOUTH 2 (TWO) TIMES DAILY AS NEEDED FOR NAUSEA OR VOMITING.  . pantoprazole (PROTONIX) 40 MG tablet TAKE 1 TABLET BY MOUTH EVERY DAY  . rosuvastatin (CRESTOR) 20 MG tablet TAKE 1 TABLET BY MOUTH EVERY DAY  . rosuvastatin (CRESTOR) 40 MG tablet TAKE 1 TABLET BY MOUTH EVERY DAY   No facility-administered encounter medications on file as of 01/30/2021.    Allergies (verified) Abilify [aripiprazole], Augmentin [amoxicillin-pot clavulanate], Bactrim  [sulfamethoxazole-trimethoprim], Ceftin [cefuroxime axetil], Coconut oil, Diclofenac, Dye fdc red [red dye], Indocin [indomethacin], Linaclotide, Lisinopril, Morphine, Morphine and related, Simvastatin, Sulfa antibiotics, Sulfamethoxazole-trimethoprim, Wellbutrin [bupropion], Zetia [ezetimibe], Quetiapine fumarate, and Quetiapine fumarate   History: Past Medical History:  Diagnosis Date  . Acute right-sided low back pain with right-sided sciatica 10/23/2017  . Anemia   . Bilateral renal cysts   . CAD (coronary artery disease) 09/28/2016  . Chest pain 10/14/2015  . Chronic back pain    "upper and lower" (09/19/2014)  . Chronic shoulder pain (Location of Primary Source of Pain) (Left) 01/29/2016  . Colitis 04/08/2016  . Colon polyp 01/30/2017  . Coronary vasospasm ([place]) 12/09/2014  . Depression    "major" (09/19/2014)  . DVT (deep venous thrombosis) ([place])   . Esophageal spasm   . Essential hypertension 10/28/2013  . Gallstones   . Gastritis   . Gastroenteritis   . GERD (gastroesophageal reflux disease)   . Headache    "couple /month lately" (09/19/2014)  . Heart disease 12/09/2016  . High blood pressure   . High cholesterol   . History of blood transfusion 1985   related to hysterectomy  . History of nuclear stress test    Myoview 1/19:  EF 65, no ischemia or scar  . History of pulmonary embolus (PE) 09/19/2014  . Hx of cervical spine surgery 01/29/2016  . 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 Rule out Paget's disease of the bone with blood work.   . Hyperlipidemia 10/28/2013  . Hypertension   . IBS (irritable bowel syndrome)   . Lumbar central spinal stenosis (moderate at L4-5; mild at L2-3 and L3-4) 01/29/2016  . Migraine    "a few times/yr" (09/19/2014)  . Myocardial infarction ([place]) 10/2010 X 3   "while hospitalized"  . Neck pain 01/29/2016  . Osteoarthritis    "qwhere; mostly around my joints" (09/19/2014)  . Pleural effusion, bilateral 07/31/2015  . Pneumonia  04/2014  . PTSD (post-traumatic stress disorder)   . Pulmonary embolism ([place]) 04/2001; 09/19/2014   "after gallbladder OR; "  . Schizoaffective disorder ([place])   . Schizoaffective disorder, depressive type ([place]) 08/28/2016  . Sleep apnea    "mild" (09/19/2014)  . Snoring    a. sleep study 5/16: No OSA  . SOB (shortness of breath) 07/31/2015  . Spinal stenosis   . Spondylosis   . Suicide attempt ([place]) 08/27/2016  . Type II diabetes mellitus ([place])    "dx'd in 2007; lost weight; no RX for ~ 4 yr now" (09/19/2014)  . Wheezing 02/25/2017   Past Surgical History:  Procedure Laterality Date  . ABDOMINAL HYSTERECTOMY  1985  . ANTERIOR CERVICAL DECOMP/DISCECTOMY FUSION  10/2012   in [place] C5-C7   . APPENDECTOMY  1985  . BACK SURGERY    . BREAST CYST EXCISION Right   . BREAST EXCISIONAL BIOPSY Right YRS AGO   NEG  .  CARDIAC CATHETERIZATION   2000's; Oct 30, 2007  . CHOLECYSTECTOMY  10/29/00  . COLONOSCOPY WITH PROPOFOL N/A 12/12/2016   Procedure: COLONOSCOPY WITH PROPOFOL;  Surgeon: [name], MD;  Location: [facility] ENDOSCOPY;  Service: Endoscopy;  Laterality: N/A;  . COLONOSCOPY WITH PROPOFOL N/A 02/17/2017   Procedure: COLONOSCOPY WITH PROPOFOL;  Surgeon: [name], MD;  Location: [facility] ENDOSCOPY;  Service: Gastroenterology;  Laterality: N/A;  . ESOPHAGOGASTRODUODENOSCOPY (EGD) WITH PROPOFOL N/A 02/17/2017   Procedure: ESOPHAGOGASTRODUODENOSCOPY (EGD) WITH PROPOFOL;  Surgeon: [name], MD;  Location: [facility] ENDOSCOPY;  Service: Gastroenterology;  Laterality: N/A;  . ESOPHAGOGASTRODUODENOSCOPY (EGD) WITH PROPOFOL N/A 03/07/2019   Procedure: ESOPHAGOGASTRODUODENOSCOPY (EGD) WITH PROPOFOL;  Surgeon: [name], MD;  Location: [facility] ENDOSCOPY;  Service: Gastroenterology;  Laterality: N/A;  . EXCISION/RELEASE BURSA HIP Right 12/1984  . HERNIA REPAIR  1985  . LEFT HEART CATHETERIZATION WITH CORONARY ANGIOGRAM N/A 12/09/2014   Procedure: LEFT HEART CATHETERIZATION WITH CORONARY ANGIOGRAM;  Surgeon: Burnell Blanks, MD;  Location[facility] C[facility] LAB;  Service: Cardiovascular;  Laterality: N/A;  . TONSILLECTOMY AND ADENOIDECTOMY  ~ 10-29-1966  . TOTAL HIP ARTHROPLASTY Left 04-06-2014  . UMBILICAL HERNIA REPAIR  1985  . VENA CAVA FILTER PLACEMENT  03/2014   Family History  Problem Relation Age of Onset  . Stroke Mother        Deceased  . Lung cancer Father        Deceased  . Healthy Daughter   . Healthy Son        x 2  . Other Son        Suicide  . Cancer Brother   . Heart attack Neg Hx    Social History   Socioeconomic History  . Marital status: Divorced    Spouse name: Not on file  . Number of children: Not on file  . Years of education: Not on file  . Highest education level: Not on file  Occupational History  . Not on file  Tobacco Use  . Smoking status: Former Smoker    Packs/day: 1.50    Years: 10.00    Pack years: 15.00    Types: Cigarettes    Quit date: 04/12/1979    Years since quitting: 41.8  . Smokeless tobacco: Never Used  Vaping Use  . Vaping Use: Never used  Substance and Sexual Activity  . Alcohol use: No    Alcohol/week: 0.0 standard drinks  . Drug use: No  . Sexual activity: Not Currently  Other Topics Concern  . Not on file  Social History Narrative   Lives with fiancee in an apartment on the first floor.  Has 3 grown children.  One child passed away.  On disability 1994-10-29 - 2000, 2007 - current.     Education: some college.   Social Determinants of Health   Financial Resource Strain: Low Risk   . Difficulty of Paying Living Expenses: Not hard at all  Food Insecurity: No Food Insecurity  . Worried About Charity fundraiser in the Last Year: Never true  . Ran Out of Food in the Last Year: Never true  Transportation Needs: No Transportation Needs  . Lack of Transportation (Medical): No  . Lack of Transportation (Non-Medical): No  Physical Activity: Unknown  . Days of Exercise per Week: 0 days  . Minutes of Exercise per Session: Not on file  Stress: No  Stress Concern Present  . Feeling of Stress : Not at all  Social Connections: Unknown  . Frequency of Communication with  Friends and Family: More than three times a week  . Frequency of Social Gatherings with Friends and Family: More than three times a week  . Attends Religious Services: Not on file  . Active Member of Clubs or Organizations: Not on file  . Attends Archivist Meetings: Not on file  . Marital Status: Not on file    Tobacco Counseling Counseling given: Not Answered   Clinical Intake:  Pre-visit preparation completed: Yes        Diabetes: No  How often do you need to have someone help you when you read instructions, pamphlets, or other written materials from your doctor or pharmacy?: 1 - Never Nutrition Risk Assessment: Has the patient had any N/V/D within the last 2 weeks?  Yes , vomiting. Bulimia. Does the patient have any non-healing wounds?  No  Has the patient had any unintentional weight loss or weight gain? 3lb weight loss since last office visit 01/14/21.  Dx Bulimia. Scheduled with [facility] 05/07/21.  Scheduled with pcp for 6 week follow up 02/25/21.  Denies depression.  Declines in office visit any sooner than scheduled.   Diabetes: Is the patient diabetic?  No  If diabetic, was a CBG obtained today?  No  Did the patient bring in their glucometer from home?  No  How often do you monitor your CBG's? Rarely.   Financial Strains and Diabetes Management: Are you having any financial strains with the device, your supplies or your medication? No .  Does the patient want to be seen by Chronic Care Management for management of their diabetes?  No  Would the patient like to be referred to a Nutritionist or for Diabetic Management?  No    Diabetic Exams:  Diabetic Eye Exam: Overdue for diabetic eye exam. Pt has been advised about the importance in completing this exam. Patient advised to call and schedule an eye exam. Plans to  schedule.  Diabetic Foot Exam: Completed 01/10/21  Interpreter Needed?: No      Activities of Daily Living In your present state of health, do you have any difficulty performing the following activities: 01/30/2021  Hearing? N  Vision? N  Difficulty concentrating or making decisions? N  Walking or climbing stairs? Y  Dressing or bathing? N  Doing errands, shopping? Y  Preparing Food and eating ? N  Using the Toilet? N  In the past six months, have you accidently leaked urine? N  Do you have problems with loss of bowel control? N  Managing your Medications? N  Managing your Finances? N  Housekeeping or managing your Housekeeping? N  Some recent data might be hidden    Patient Care Team: [name], MD as PCP - General (Family Medicine) Kate Sable, MD as PCP - Cardiology (Cardiology) Clent Jacks, RN as Oncology Nurse Navigator  Indicate any recent Medical Services you may have received from other than [facility] providers in the past year (date may be approximate).     Assessment:   This is a routine wellness examination for Valerie Vazquez.  I connected with Valerie Vazquez today by telephone and verified that I am speaking with the correct person using two identifiers. Location patient: home Location provider: work Persons participating in the virtual visit: patient, Marine scientist.    I discussed the limitations, risks, security and privacy concerns of performing an evaluation and management service by telephone and the availability of in person appointments. The patient expressed understanding and verbally consented to this telephonic visit.  Interactive audio and video telecommunications were attempted between this provider and patient, however failed, due to patient having technical difficulties OR patient did not have access to video capability.  We continued and completed visit with audio only.  Some vital signs may be absent or patient reported.   Hearing/Vision  screen  Hearing Screening   _0  _1  _2  _3  _4  _5  _6  _7  _8   Right ear:           Left ear:           Comments: Patient has difficulty hearing conversational tones in a crowd. Audiology deferred per patient preference.   Vision Screening Comments: Followed by [facility] No retinopathy reported.  Visual acuity not assessed, virtual visit.  Plans to schedule annual.     Dietary issues and exercise activities discussed: Current Exercise Habits: The patient does not participate in regular exercise at present  Regular diet Good water intake  Goals Addressed              This Visit's Progress     Patient Stated   .  Weight loss goal <200lb (pt-stated)        Healthy portion control meals      Other   .  Increase water intake        Depression Screen PHQ 2/9 Scores 01/30/2021 12/07/2020 09/14/2020 01/31/2020 01/27/2020 12/19/2019 05/24/2019  PHQ - 2 Score 0 0 0 0 0 0 0  PHQ- 9 Score - - - - - - 0  Some encounter information is confidential and restricted. Go to Review Flowsheets activity to see all data.    Fall Risk Fall Risk  01/30/2021 12/07/2020 01/31/2020 01/27/2020 12/19/2019  Falls in the past year? 0 0 0 0 0  Number falls in past yr: 0 0 - 0 0  Injury with Fall? 0 - - - 0  Comment - - - - N/A- no falls reported  Risk Factor Category  - - - - -  Risk for fall due to : - - - - Impaired mobility;Other (Comment);Medication side effect  Risk for fall due to: Comment - - - - chronic pain  Follow up Falls evaluation completed Falls evaluation completed - Falls evaluation completed Falls prevention discussed  Some encounter information is confidential and restricted. Go to Review Flowsheets activity to see all data.    FALL RISK PREVENTION PERTAINING TO THE HOME: Handrails I use when climbing stairs?Yes Home free of loose throw rugs in walkways, pet beds, electrical cords, etc? Yes  Adequate lighting in your home to reduce risk of falls? Yes    ASSISTIVE DEVICES UTILIZED TO PREVENT FALLS: Life alert? Yes  Use of a cane, walker or w/c? Yes , rollator walker as needed Grab bars in the bathroom? Yes  Shower chair or bench in shower? No  Elevated toilet seat or a handicapped toilet? No   TIMED UP AND GO: Was the test performed? No .  Virtual visit.   Cognitive Function:  Patient is alert and oriented x3.    6CIT Screen 01/30/2021 01/27/2020  What Year? 0 points 0 points  What month? 0 points 0 points  What time? 0 points 0 points  Count back from 20 0 points -  Months in reverse 0 points 0 points  Repeat phrase 0 points 0 points  Total Score 0 -  Some encounter information is confidential and restricted. Go to Review Flowsheets activity to see all data.    Immunizations  There  is no immunization history on file for this patient.   Covid and Shingles immunizations discontinued per patient preference.   TDAP status: Due, Education has been provided regarding the importance of this vaccine. Advised may receive this vaccine at local pharmacy or Health Dept. Aware to provide a copy of the vaccination record if obtained from local pharmacy or Health Dept. Verbalized acceptance and understanding. Deferred.   Health Maintenance Health Maintenance  Topic Date Due  . URINE MICROALBUMIN  01/26/2021  . OPHTHALMOLOGY EXAM  01/30/2021 (Originally 10/27/2020)  . PNA vac Low Risk Adult (1 of 2 - PCV13) 11/12/2021 (Originally 04/03/2019)  . TETANUS/TDAP  01/30/2022 (Originally 04/02/1973)  . INFLUENZA VACCINE  04/01/2021  . HEMOGLOBIN A1C  05/15/2021  . FOOT EXAM  12/20/2021  . COLONOSCOPY (Pts 45-36yr Insurance coverage will need to be confirmed)  02/17/2022  . MAMMOGRAM  04/30/2022  . DEXA SCAN  Completed  . Hepatitis C Screening  Completed  . HPV VACCINES  Aged Out  . COVID-19 Vaccine  Discontinued  . Zoster Vaccines- Shingrix  Discontinued   Colorectal cancer screening: Type of screening: Colonoscopy. Completed 02/17/17. Repeat  every 5 years  Mammogram status: Completed 04/30/20. Repeat every year  DG Chest 2 view: 06/01/20    Dental Screening: Recommended annual dental exams for proper oral hygiene. Plans to schedule annual.   Community Resource Referral / Chronic Care Management: CRR required this visit?  No   CCM required this visit?  No      Plan:   Keep all routine maintenance appointments.   Schedule eye exam Schedule dental exam  I have personally reviewed and noted the following in the patient's chart:   . Medical and social history . Use of alcohol, tobacco or illicit drugs  . Current medications and supplements including opioid prescriptions.  . Functional ability and status . Nutritional status . Physical activity . Advanced directives . List of other physicians . Hospitalizations, surgeries, and ER visits in previous 12 months . Vitals . Screenings to include cognitive, depression, and falls . Referrals and appointments  In addition, I have reviewed and discussed with patient certain preventive protocols, quality metrics, and best practice recommendations. A written personalized care plan for preventive services as well as general preventive health recommendations were provided to patient via mychart.     OVarney Biles LPN   62/04/2059

## 2021-01-30 NOTE — Patient Instructions (Addendum)
Valerie Vazquez , Thank you for taking time to come for your Medicare Wellness Visit. I appreciate your ongoing commitment to your health goals. Please review the following plan we discussed and let me know if I can assist you in the future.   These are the goals we discussed: Goals      Patient Stated   .  Weight loss goal <200lb (pt-stated)      Healthy portion control meals      Other   .  Increase water intake       This is a list of the screening recommended for you and due dates:  Health Maintenance  Topic Date Due  . Urine Protein Check  01/26/2021  . Eye exam for diabetics  01/30/2021*  . Pneumonia vaccines (1 of 2 - PCV13) 11/12/2021*  . Tetanus Vaccine  01/30/2022*  . Flu Shot  04/01/2021  . Hemoglobin A1C  05/15/2021  . Complete foot exam   12/20/2021  . Colon Cancer Screening  02/17/2022  . Mammogram  04/30/2022  . DEXA scan (bone density measurement)  Completed  . Hepatitis C Screening: USPSTF Recommendation to screen - Ages 45-79 yo.  Completed  . HPV Vaccine  Aged Out  . COVID-19 Vaccine  Discontinued  . Zoster (Shingles) Vaccine  Discontinued  *Topic was postponed. The date shown is not the original due date.   Advanced directives: not yet completed.   Conditions/risks identified: Bulimia, per patient. Followed by [facility]. Appointment scheduled.   Follow up in one year for your annual wellness visit.   Preventive Care 86 Years and Older, Female Preventive care refers to lifestyle choices and visits with your health care provider that can promote health and wellness. What does preventive care include?  A yearly physical exam. This is also called an annual well check.  Dental exams once or twice a year.  Routine eye exams. Ask your health care provider how often you should have your eyes checked.  Personal lifestyle choices, including:  Daily care of your teeth and gums.  Regular physical activity.  Eating a healthy diet.  Avoiding tobacco  and drug use.  Limiting alcohol use.  Practicing safe sex.  Taking low-dose aspirin every day.  Taking vitamin and mineral supplements as recommended by your health care provider. What happens during an annual well check? The services and screenings done by your health care provider during your annual well check will depend on your age, overall health, lifestyle risk factors, and family history of disease. Counseling  Your health care provider may ask you questions about your:  Alcohol use.  Tobacco use.  Drug use.  Emotional well-being.  Home and relationship well-being.  Sexual activity.  Eating habits.  History of falls.  Memory and ability to understand (cognition).  Work and work Statistician.  Reproductive health. Screening  You may have the following tests or measurements:  Height, weight, and BMI.  Blood pressure.  Lipid and cholesterol levels. These may be checked every 5 years, or more frequently if you are over 7 years old.  Skin check.  Lung cancer screening. You may have this screening every year starting at age 12 if you have a 30-pack-year history of smoking and currently smoke or have quit within the past 15 years.  Fecal occult blood test (FOBT) of the stool. You may have this test every year starting at age 76.  Flexible sigmoidoscopy or colonoscopy. You may have a sigmoidoscopy every 5 years or a colonoscopy every  10 years starting at age 73.  Hepatitis C blood test.  Hepatitis B blood test.  Sexually transmitted disease (STD) testing.  Diabetes screening. This is done by checking your blood sugar (glucose) after you have not eaten for a while (fasting). You may have this done every 1-3 years.  Bone density scan. This is done to screen for osteoporosis. You may have this done starting at age 22.  Mammogram. This may be done every 1-2 years. Talk to your health care provider about how often you should have regular mammograms. Talk with  your health care provider about your test results, treatment options, and if necessary, the need for more tests. Vaccines  Your health care provider may recommend certain vaccines, such as:  Influenza vaccine. This is recommended every year.  Tetanus, diphtheria, and acellular pertussis (Tdap, Td) vaccine. You may need a Td booster every 10 years.  Zoster vaccine. You may need this after age 23.  Pneumococcal 13-valent conjugate (PCV13) vaccine. One dose is recommended after age 69.  Pneumococcal polysaccharide (PPSV23) vaccine. One dose is recommended after age 60. Talk to your health care provider about which screenings and vaccines you need and how often you need them. This information is not intended to replace advice given to you by your health care provider. Make sure you discuss any questions you have with your health care provider. Document Released: 09/14/2015 Document Revised: 05/07/2016 Document Reviewed: 06/19/2015 Elsevier Interactive Patient Education  2017 [place] Prevention in the Home Falls can cause injuries. They can happen to people of all ages. There are many things you can do to make your home safe and to help prevent falls. What can I do on the outside of my home?  Regularly fix the edges of walkways and driveways and fix any cracks.  Remove anything that might make you trip as you walk through a door, such as a raised step or threshold.  Trim any bushes or trees on the path to your home.  Use bright outdoor lighting.  Clear any walking paths of anything that might make someone trip, such as rocks or tools.  Regularly check to see if handrails are loose or broken. Make sure that both sides of any steps have handrails.  Any raised decks and porches should have guardrails on the edges.  Have any leaves, snow, or ice cleared regularly.  Use sand or salt on walking paths during winter.  Clean up any spills in your garage right away. This  includes oil or grease spills. What can I do in the bathroom?  Use night lights.  Install grab bars by the toilet and in the tub and shower. Do not use towel bars as grab bars.  Use non-skid mats or decals in the tub or shower.  If you need to sit down in the shower, use a plastic, non-slip stool.  Keep the floor dry. Clean up any water that spills on the floor as soon as it happens.  Remove soap buildup in the tub or shower regularly.  Attach bath mats securely with double-sided non-slip rug tape.  Do not have throw rugs and other things on the floor that can make you trip. What can I do in the bedroom?  Use night lights.  Make sure that you have a light by your bed that is easy to reach.  Do not use any sheets or blankets that are too big for your bed. They should not hang down onto the floor.  Have a firm chair that has side arms. You can use this for support while you get dressed.  Do not have throw rugs and other things on the floor that can make you trip. What can I do in the kitchen?  Clean up any spills right away.  Avoid walking on wet floors.  Keep items that you use a lot in easy-to-reach places.  If you need to reach something above you, use a strong step stool that has a grab bar.  Keep electrical cords out of the way.  Do not use floor polish or wax that makes floors slippery. If you must use wax, use non-skid floor wax.  Do not have throw rugs and other things on the floor that can make you trip. What can I do with my stairs?  Do not leave any items on the stairs.  Make sure that there are handrails on both sides of the stairs and use them. Fix handrails that are broken or loose. Make sure that handrails are as long as the stairways.  Check any carpeting to make sure that it is firmly attached to the stairs. Fix any carpet that is loose or worn.  Avoid having throw rugs at the top or bottom of the stairs. If you do have throw rugs, attach them to the  floor with carpet tape.  Make sure that you have a light switch at the top of the stairs and the bottom of the stairs. If you do not have them, ask someone to add them for you. What else can I do to help prevent falls?  Wear shoes that:  Do not have high heels.  Have rubber bottoms.  Are comfortable and fit you well.  Are closed at the toe. Do not wear sandals.  If you use a stepladder:  Make sure that it is fully opened. Do not climb a closed stepladder.  Make sure that both sides of the stepladder are locked into place.  Ask someone to hold it for you, if possible.  Clearly mark and make sure that you can see:  Any grab bars or handrails.  First and last steps.  Where the edge of each step is.  Use tools that help you move around (mobility aids) if they are needed. These include:  Canes.  Walkers.  Scooters.  Crutches.  Turn on the lights when you go into a dark area. Replace any light bulbs as soon as they burn out.  Set up your furniture so you have a clear path. Avoid moving your furniture around.  If any of your floors are uneven, fix them.  If there are any pets around you, be aware of where they are.  Review your medicines with your doctor. Some medicines can make you feel dizzy. This can increase your chance of falling. Ask your doctor what other things that you can do to help prevent falls. This information is not intended to replace advice given to you by your health care provider. Make sure you discuss any questions you have with your health care provider. Document Released: 06/14/2009 Document Revised: 01/24/2016 Document Reviewed: 09/22/2014 Elsevier Interactive Patient Education  2017 [employer].

## 2021-02-16 ENCOUNTER — Encounter (INDEPENDENT_AMBULATORY_CARE_PROVIDER_SITE_OTHER): Payer: Self-pay

## 2021-02-25 ENCOUNTER — Ambulatory Visit: Payer: Medicare HMO | Admitting: Family Medicine

## 2021-03-06 IMAGING — MG DIGITAL SCREENING BILATERAL MAMMOGRAM WITH TOMO AND CAD
6 of 10 series · 6 of 30 positions shown · non-contrast
Comparison: Previous exam(s).

CLINICAL DATA: Screening.

EXAM:
DIGITAL SCREENING BILATERAL MAMMOGRAM WITH TOMO AND CAD

[L CC synth-2D]
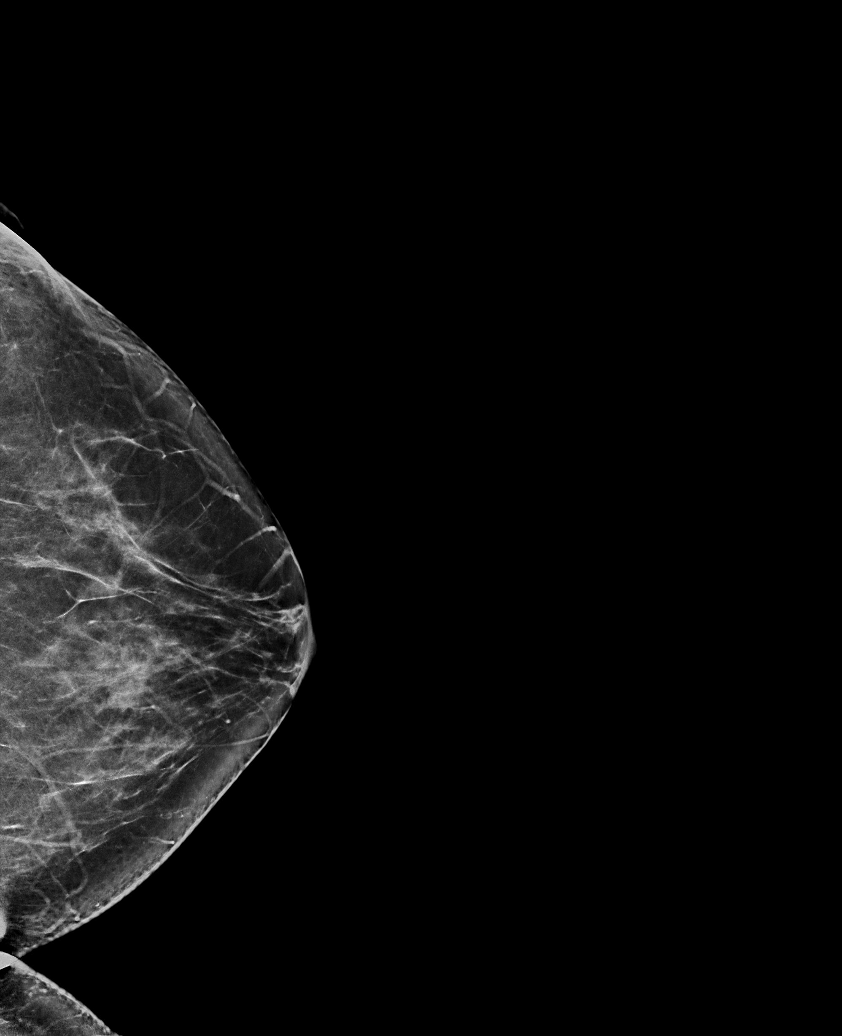

[R CC synth-2D (1 of 2)]
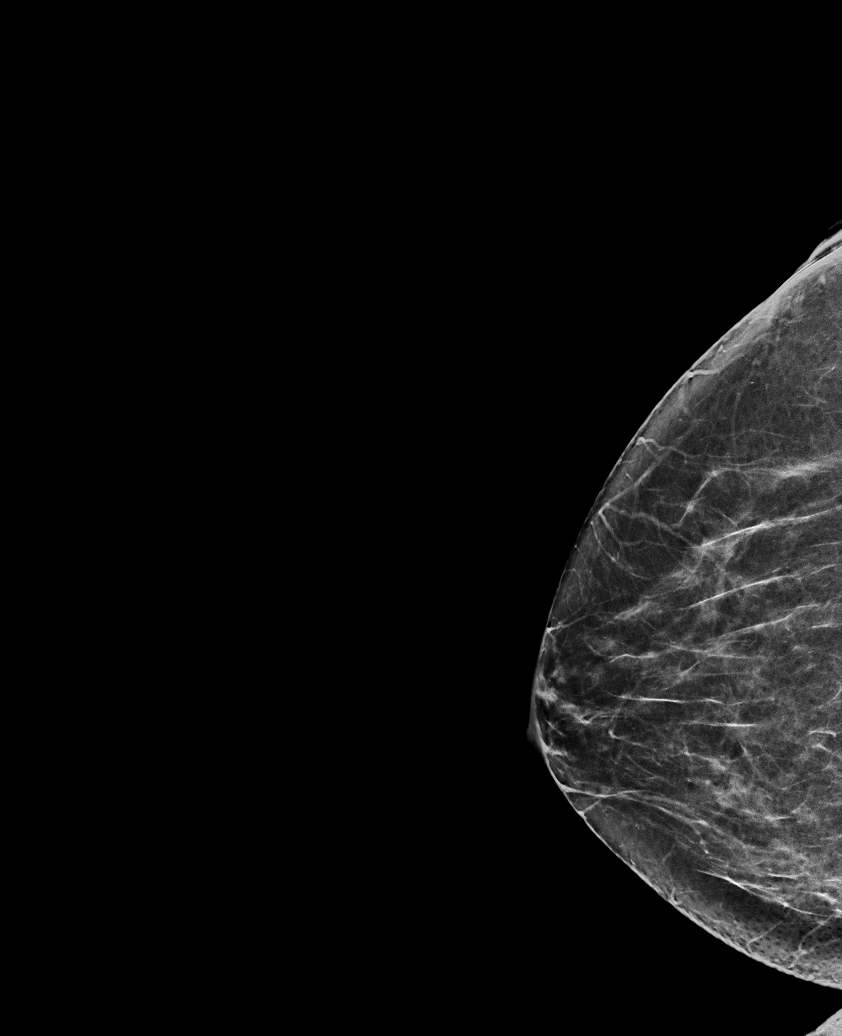

[R MLO synth-2D]
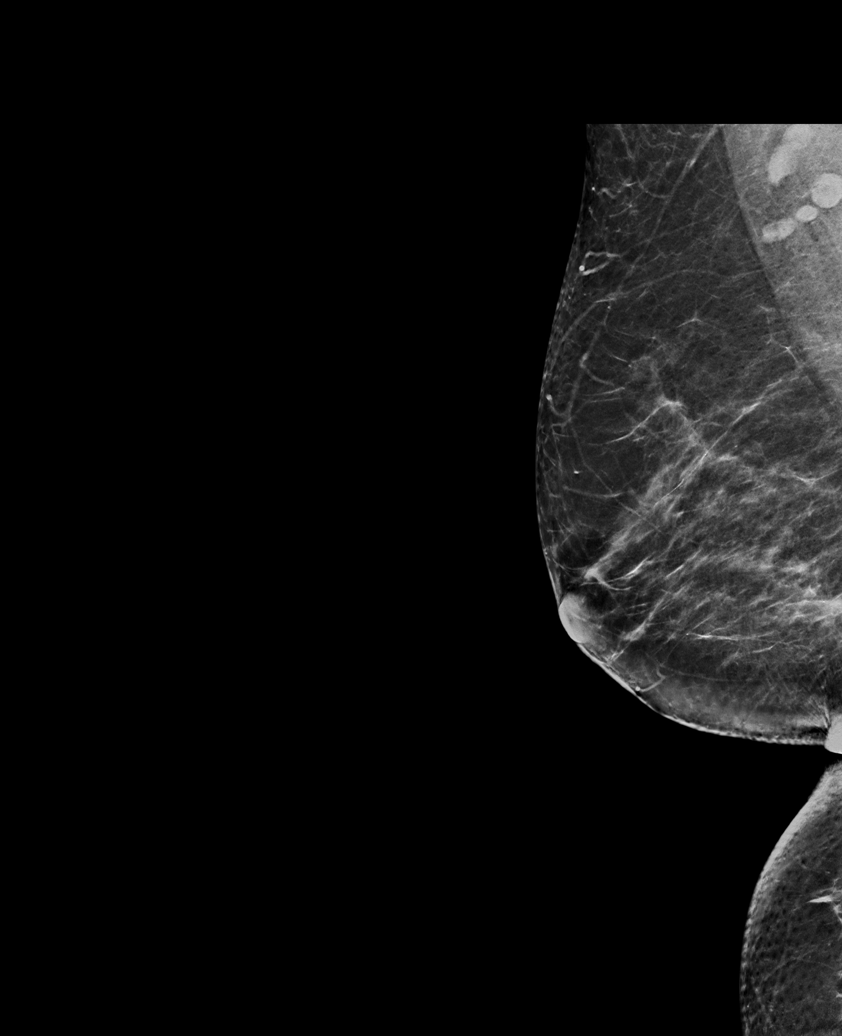

[L MLO synth-2D]
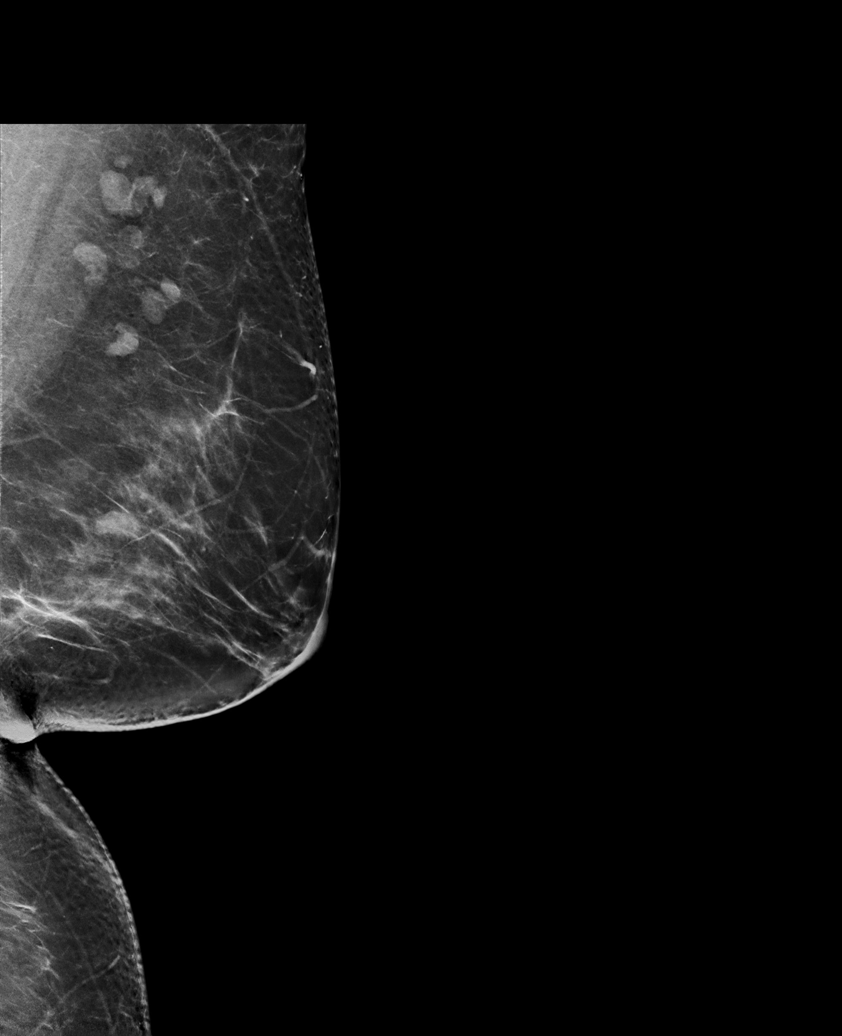

[R CC synth-2D (2 of 2)]
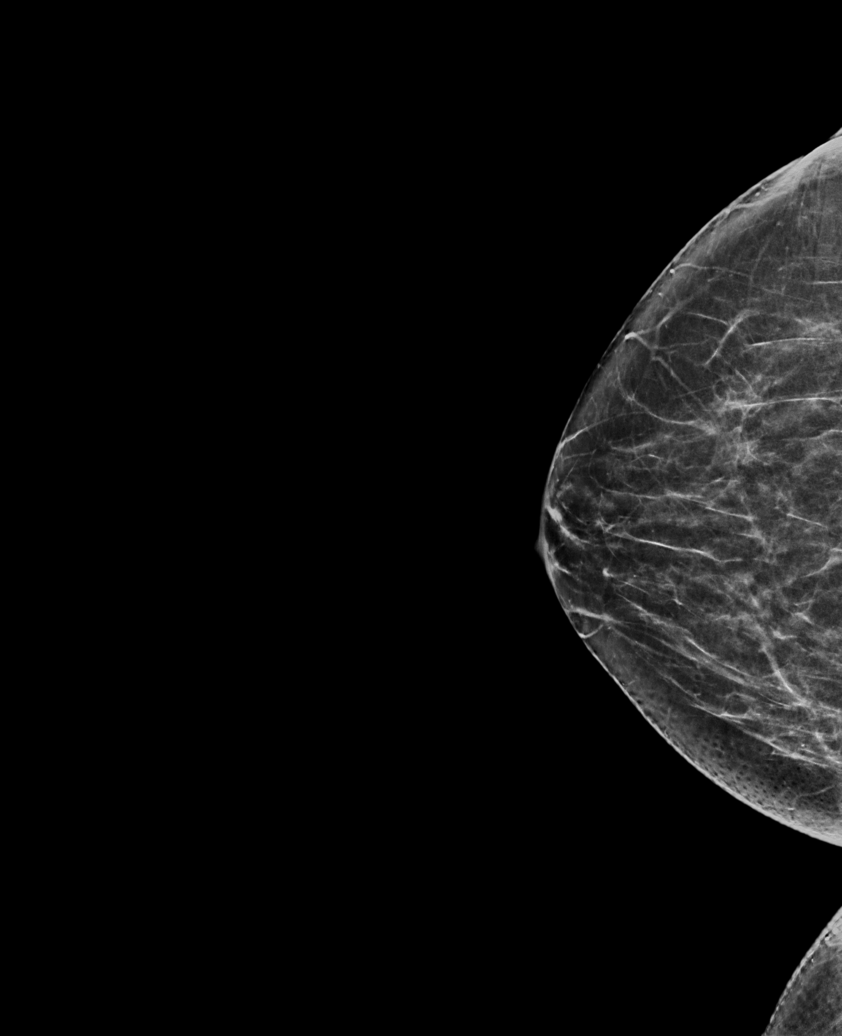

[L MLO tomo · tomo slice 43/85.0]
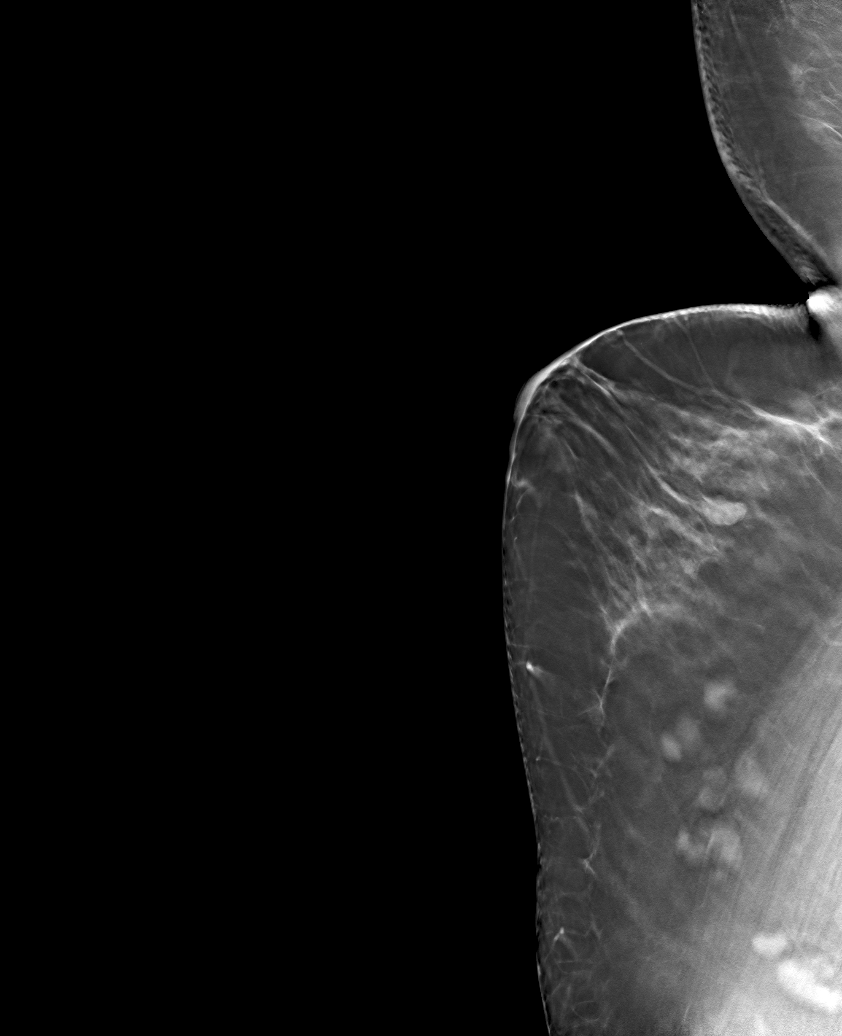

[6 of 30 positions shown; findings below may reference images not displayed]

ACR Breast Density Category b: There are scattered areas of
fibroglandular density.
FINDINGS: There are no findings suspicious for malignancy. Images were
processed with CAD.
IMPRESSION: No mammographic evidence of malignancy. A result letter of this
screening mammogram will be mailed directly to the patient.

RECOMMENDATION:
Screening mammogram in one year. (Code:[id number])

BI-RADS CATEGORY  1: Negative.

## 2021-03-10 ENCOUNTER — Other Ambulatory Visit: Payer: Self-pay | Admitting: Family Medicine

## 2021-03-10 DIAGNOSIS — Z760 Encounter for issue of repeat prescription: Secondary | ICD-10-CM

## 2021-03-10 DIAGNOSIS — E785 Hyperlipidemia, unspecified: Secondary | ICD-10-CM

## 2021-03-11 ENCOUNTER — Other Ambulatory Visit: Payer: Self-pay | Admitting: Family Medicine

## 2021-03-11 DIAGNOSIS — F411 Generalized anxiety disorder: Secondary | ICD-10-CM

## 2021-03-18 ENCOUNTER — Other Ambulatory Visit: Payer: Self-pay | Admitting: Family Medicine

## 2021-03-18 DIAGNOSIS — E063 Autoimmune thyroiditis: Secondary | ICD-10-CM

## 2021-03-18 NOTE — Telephone Encounter (Signed)
[facility] on [address] in [place] states that they did not receive the rx for levothyroxine (SYNTHROID) 25 MCG tablet. Please resend

## 2021-03-28 ENCOUNTER — Other Ambulatory Visit: Payer: Self-pay | Admitting: Family Medicine

## 2021-03-28 ENCOUNTER — Other Ambulatory Visit: Payer: Self-pay | Admitting: Internal Medicine

## 2021-04-01 ENCOUNTER — Other Ambulatory Visit: Payer: Self-pay | Admitting: Family Medicine

## 2021-04-01 DIAGNOSIS — R1013 Epigastric pain: Secondary | ICD-10-CM

## 2021-04-05 DIAGNOSIS — M542 Cervicalgia: Secondary | ICD-10-CM | POA: Diagnosis not present

## 2021-04-06 ENCOUNTER — Other Ambulatory Visit: Payer: Self-pay | Admitting: Family Medicine

## 2021-04-18 DIAGNOSIS — M5412 Radiculopathy, cervical region: Secondary | ICD-10-CM | POA: Diagnosis not present

## 2021-05-04 ENCOUNTER — Other Ambulatory Visit: Payer: Self-pay | Admitting: Family Medicine

## 2021-05-04 DIAGNOSIS — F250 Schizoaffective disorder, bipolar type: Secondary | ICD-10-CM

## 2021-05-07 DIAGNOSIS — F502 Bulimia nervosa, unspecified: Secondary | ICD-10-CM

## 2021-05-07 HISTORY — DX: Bulimia nervosa: F50.2

## 2021-05-07 HISTORY — DX: Bulimia nervosa, unspecified: F50.20

## 2021-05-09 ENCOUNTER — Other Ambulatory Visit: Payer: Self-pay | Admitting: Internal Medicine

## 2021-05-13 ENCOUNTER — Encounter: Payer: Self-pay | Admitting: Cardiology

## 2021-05-13 ENCOUNTER — Other Ambulatory Visit: Payer: Self-pay

## 2021-05-13 ENCOUNTER — Ambulatory Visit (INDEPENDENT_AMBULATORY_CARE_PROVIDER_SITE_OTHER): Payer: Medicare Other | Admitting: Cardiology

## 2021-05-13 VITALS — BP 120/70 | HR 73 | Ht 66.0 in | Wt 215.0 lb

## 2021-05-13 DIAGNOSIS — E7800 Pure hypercholesterolemia, unspecified: Secondary | ICD-10-CM

## 2021-05-13 DIAGNOSIS — I2510 Atherosclerotic heart disease of native coronary artery without angina pectoris: Secondary | ICD-10-CM | POA: Diagnosis not present

## 2021-05-13 DIAGNOSIS — Z01818 Encounter for other preprocedural examination: Secondary | ICD-10-CM

## 2021-05-13 DIAGNOSIS — I2584 Coronary atherosclerosis due to calcified coronary lesion: Secondary | ICD-10-CM

## 2021-05-13 DIAGNOSIS — I10 Essential (primary) hypertension: Secondary | ICD-10-CM

## 2021-05-13 MED ORDER — FUROSEMIDE 20 MG PO TABS: 20.0000 mg | ORAL_TABLET | Freq: Every day | ORAL | 1 refills | Status: DC | PRN

## 2021-05-13 NOTE — Patient Instructions (Signed)

## 2021-05-13 NOTE — Progress Notes (Signed)
Cardiology Office Note:    Date:  05/13/2021   ID:  Valerie Vazquez, DOB Nov 03, 1955, MRN [id number]  PCP:  [name], MD   [place]  Cardiologist:  Kate Sable, MD  Advanced Practice Provider:  No care team member to display Electrophysiologist:  None       Referring MD: [name], MD   Chief Complaint  Patient presents with   Other    6 month follow up -- Patient c.o "mild chest pain" and swelling in ankles. Meds reviewed verbally with patient.     History of Present Illness:    Valerie Vazquez is a 65 y.o. female with a hx of Schizoaffective disorder, diabetes, coronary vasospasm hypertension history of DVT on Eliquis who presents for follow-up.    Previously seen due to edema and coronary artery calcifications.  Started on HCTZ with improvement in edema, HCTZ was later switched to Lasix..  Takes imdur to help with vasospasm.  Norvasc was stopped.  Blood pressures are adequately controlled.  She has noticed some swelling in her legs of late evening when waking up.  Has been out of her Lasix for some time now.  She has degenerative neck disease, possible neck surgery/fusion is being planned in the near future.  She feels well otherwise, denies chest pain, shortness of breath, palpitations.   Prior notes  Lexiscan 07/2020 low risk study, no evidence for ischemia, EF 55 to 65% Echocardiogram 0/0867, normal systolic function, EF 60 to 65%, impaired relaxation.  Past Medical History:  Diagnosis Date   Acute right-sided low back pain with right-sided sciatica 10/23/2017   Anemia    Bilateral renal cysts    CAD (coronary artery disease) 09/28/2016   Chest pain 10/14/2015   Chronic back pain    "upper and lower" (09/19/2014)   Chronic shoulder pain (Location of Primary Source of Pain) (Left) 01/29/2016   Colitis 04/08/2016   Colon polyp 01/30/2017   Coronary vasospasm ([place]) 12/09/2014   Depression    "major" (09/19/2014)   DVT (deep  venous thrombosis) (HCC)    Esophageal spasm    Essential hypertension 10/28/2013   Gallstones    Gastritis    Gastroenteritis    GERD (gastroesophageal reflux disease)    Headache    "couple /month lately" (09/19/2014)   Heart disease 12/09/2016   High blood pressure    High cholesterol    History of blood transfusion 1985   related to hysterectomy   History of nuclear stress test    Myoview 1/19:  EF 65, no ischemia or scar   History of pulmonary embolus (PE) 09/19/2014   Hx of cervical spine surgery 01/29/2016   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 Rule out Paget's disease of the bone with blood work.    Hyperlipidemia 10/28/2013   Hypertension    IBS (irritable bowel syndrome)    Lumbar central spinal stenosis (moderate at L4-5; mild at L2-3 and L3-4) 01/29/2016   Migraine    "a few times/yr" (09/19/2014)   Myocardial infarction ([place]) 10/2010 X 3   "while hospitalized"   Neck pain 01/29/2016   Osteoarthritis    "qwhere; mostly around my joints" (09/19/2014)   Pleural effusion, bilateral 07/31/2015   Pneumonia 04/2014   PTSD (post-traumatic stress disorder)    Pulmonary embolism ([place]) 04/2001; 09/19/2014   "after gallbladder OR; "   Schizoaffective disorder ([place])    Schizoaffective disorder, depressive type ([place]) 08/28/2016   Sleep  apnea    "mild" (09/19/2014)   Snoring    a. sleep study 5/16: No OSA   SOB (shortness of breath) 07/31/2015   Spinal stenosis    Spondylosis    Suicide attempt ([place]) 08/27/2016   Type II diabetes mellitus ([place])    "dx'd in 2007; lost weight; no RX for ~ 4 yr now" (09/19/2014)   Wheezing 02/25/2017    Past Surgical History:  Procedure Laterality Date   [place] DECOMP/DISCECTOMY FUSION  10/2012   in [place] C5-C7    [place] CYST EXCISION Right    BREAST EXCISIONAL BIOPSY Right YRS AGO   NEG   CARDIAC CATHETERIZATION   2000's; 2009    CHOLECYSTECTOMY  2002   COLONOSCOPY WITH PROPOFOL N/A 12/12/2016   Procedure: COLONOSCOPY WITH PROPOFOL;  Surgeon: [name], MD;  Location: [facility] ENDOSCOPY;  Service: Endoscopy;  Laterality: N/A;   COLONOSCOPY WITH PROPOFOL N/A 02/17/2017   Procedure: COLONOSCOPY WITH PROPOFOL;  Surgeon: [name], MD;  Location: [facility] ENDOSCOPY;  Service: Gastroenterology;  Laterality: N/A;   ESOPHAGOGASTRODUODENOSCOPY (EGD) WITH PROPOFOL N/A 02/17/2017   Procedure: ESOPHAGOGASTRODUODENOSCOPY (EGD) WITH PROPOFOL;  Surgeon: [name], MD;  Location: [facility] ENDOSCOPY;  Service: Gastroenterology;  Laterality: N/A;   ESOPHAGOGASTRODUODENOSCOPY (EGD) WITH PROPOFOL N/A 03/07/2019   Procedure: ESOPHAGOGASTRODUODENOSCOPY (EGD) WITH PROPOFOL;  Surgeon: [name], MD;  Location: [facility] ENDOSCOPY;  Service: Gastroenterology;  Laterality: N/A;   EXCISION/RELEASE BURSA HIP Right 12/1984   HERNIA REPAIR  1985   LEFT HEART CATHETERIZATION WITH CORONARY ANGIOGRAM N/A 12/09/2014   Procedure: LEFT HEART CATHETERIZATION WITH CORONARY ANGIOGRAM;  Surgeon: Burnell Blanks, MD;  Location: [facility] CATH LAB;  Service: Cardiovascular;  Laterality: N/A;   TONSILLECTOMY AND ADENOIDECTOMY  ~ [place] ARTHROPLASTY Left [phone]   UMBILICAL HERNIA REPAIR  1985   VENA CAVA FILTER PLACEMENT  03/2014    Current Medications: Current Meds  Medication Sig   acetaminophen (TYLENOL) 500 MG tablet Take 1 tablet (500 mg total) by mouth every 6 (six) hours as needed.   azelastine (ASTELIN) 0.1 % nasal spray Place 2 sprays into both nostrils 2 (two) times daily. Use in each nostril as directed   baclofen (LIORESAL) 10 MG tablet TAKE 1 TABLET BY MOUTH THREE TIMES A DAY AS NEEDED FOR MUSCLE SPASMS   Blood Glucose Monitoring Suppl (GHT BLOOD GLUCOSE MONITOR) w/Device KIT DISPENSE BASED ON PATIENT AND INSURANCE PREFERENCE. USE ONCE DAILY AS DIRECTED. DX CODE E11.9.   cetirizine (ZYRTEC) 10 MG tablet TAKE 1 TABLET BY MOUTH EVERY DAY   dicyclomine  (BENTYL) 20 MG tablet TAKE 1 TABLET BY MOUTH THREE TIMES A DAY AS NEEDED FOR MUSCLE SPASMS   diphenhydrAMINE (BENADRYL) 25 MG tablet Take 1 tablet (25 mg total) by mouth every 6 (six) hours.   ELIQUIS 5 MG TABS tablet TAKE 1 TABLET BY MOUTH TWICE A DAY   escitalopram (LEXAPRO) 20 MG tablet TAKE 1 TABLET BY MOUTH EVERY DAY   estradiol (ESTRACE) 0.1 MG/GM vaginal cream Dispose applicator, use pea size amount 3 times a week at night   famotidine (PEPCID) 20 MG tablet TAKE 1 TABLET BY MOUTH EVERYDAY AT BEDTIME   fluticasone (FLONASE) 50 MCG/ACT nasal spray Place 2 sprays into both nostrils daily.   gabapentin (NEURONTIN) 600 MG tablet TAKE 1 TABLET IN THE AM AND 1.5 TABLETS AT BEDTIME   isosorbide mononitrate (IMDUR) 30 MG 24 hr tablet  Take 30 mg by mouth daily.   JANUVIA 100 MG tablet TAKE 1 TABLET BY MOUTH EVERY DAY   JARDIANCE 25 MG TABS tablet TAKE 1 TABLET BY MOUTH EVERY DAY   KLOR-CON M20 20 MEQ tablet TAKE 1 TABLET (20 MEQ TOTAL) BY MOUTH DAILY AS NEEDED (TAKE WITH LASIX).   levothyroxine (SYNTHROID) 25 MCG tablet TAKE 1 TABLET BY MOUTH BEFORE BREAKFAST 2-3 HOURS BEFORE TAKING ANY OTHER MEDICATION OR SUPPLEMENT   lidocaine (LIDODERM) 5 % Place 1 patch onto the skin daily. Remove & Discard patch within 12 hours.   Multiple Vitamins-Minerals (SUPER THERA VITE M) TABS Take by mouth.   nitroGLYCERIN (NITROSTAT) 0.4 MG SL tablet Place 1 tablet (0.4 mg total) under the tongue every 5 (five) minutes as needed for chest pain.   OLANZapine (ZYPREXA) 10 MG tablet TAKE 1 TABLET BY MOUTH EVERYDAY AT BEDTIME   ondansetron (ZOFRAN) 4 MG tablet TAKE 1 TABLET (4 MG TOTAL) BY MOUTH 2 (TWO) TIMES DAILY AS NEEDED FOR NAUSEA OR VOMITING.   rosuvastatin (CRESTOR) 20 MG tablet TAKE 1 TABLET BY MOUTH EVERY DAY   [DISCONTINUED] furosemide (LASIX) 20 MG tablet TAKE 1 TABLET (20 MG TOTAL) BY MOUTH DAILY AS NEEDED FOR EDEMA.     Allergies:   Abilify [aripiprazole], Augmentin [amoxicillin-pot clavulanate], Bactrim  [sulfamethoxazole-trimethoprim], Ceftin [cefuroxime axetil], Coconut oil, Diclofenac, Dye fdc red [red dye], Indocin [indomethacin], Linaclotide, Lisinopril, Morphine, Morphine and related, Simvastatin, Sulfa antibiotics, Sulfamethoxazole-trimethoprim, Wellbutrin [bupropion], Zetia [ezetimibe], Quetiapine fumarate, and Quetiapine fumarate   Social History   Socioeconomic History   Marital status: Divorced    Spouse name: Not on file   Number of children: Not on file   Years of education: Not on file   Highest education level: Not on file  Occupational History   Not on file  Tobacco Use   Smoking status: Former    Packs/day: 1.50    Years: 10.00    Pack years: 15.00    Types: Cigarettes    Quit date: 04/12/1979    Years since quitting: 42.1   Smokeless tobacco: Never  Vaping Use   Vaping Use: Never used  Substance and Sexual Activity   Alcohol use: No    Alcohol/week: 0.0 standard drinks   Drug use: No   Sexual activity: Not Currently  Other Topics Concern   Not on file  Social History Narrative   Lives with fiancee in an apartment on the first floor.  Has 3 grown children.  One child passed away.  On disability 31-Oct-1994 - 2000, 2007 - current.     Education: some college.   Social Determinants of Health   Financial Resource Strain: Low Risk    Difficulty of Paying Living Expenses: Not hard at all  Food Insecurity: No Food Insecurity   Worried About Charity fundraiser in the Last Year: Never true   [place] in the Last Year: Never true  Transportation Needs: No Transportation Needs   Lack of Transportation (Medical): No   Lack of Transportation (Non-Medical): No  Physical Activity: Unknown   Days of Exercise per Week: 0 days   Minutes of Exercise per Session: Not on file  Stress: No Stress Concern Present   Feeling of Stress : Not at all  Social Connections: Unknown   Frequency of Communication with Friends and Family: More than three times a week   Frequency of  Social Gatherings with Friends and Family: More than three times a week   Attends Religious  Services: Not on file   Active Member of Clubs or Organizations: Not on file   Attends Archivist Meetings: Not on file   Marital Status: Not on file     Family History: The patient's family history includes Cancer in her brother; Healthy in her daughter and son; Lung cancer in her father; Other in her son; Stroke in her mother. There is no history of Heart attack.  ROS:   Please see the history of present illness.     All other systems reviewed and are negative.  EKGs/Labs/Other Studies Reviewed:    The following studies were reviewed today:   EKG:  EKG is  ordered today.  The ekg ordered today demonstrates normal sinus rhythm  Recent Labs: 09/06/2020: BUN 9; Creatinine, Ser 0.83; Hemoglobin 12.7; Platelets 334; Potassium 3.8; Sodium 141  Recent Lipid Panel    Component Value Date/Time   CHOL 169 03/12/2020 0935   TRIG 96.0 03/12/2020 0935   HDL 40.00 03/12/2020 0935   CHOLHDL 4 03/12/2020 0935   VLDL 19.2 03/12/2020 0935   LDLCALC 110 (H) 03/12/2020 0935   LDLDIRECT 111.0 05/03/2020 0800     Risk Assessment/Calculations:      Physical Exam:    VS:  BP 120/70 (BP Location: Left Arm, Patient Position: Sitting, Cuff Size: Large)   Pulse 73   Ht _0  (1.676 m)   Wt 215 lb (97.5 kg)   SpO2 95%   BMI 34.70 kg/m     Wt Readings from Last 3 Encounters:  05/13/21 215 lb (97.5 kg)  01/30/21 215 lb 3.2 oz (97.6 kg)  01/14/21 218 lb 3.2 oz (99 kg)     GEN:  Well nourished, well developed in no acute distress HEENT: Normal NECK: No JVD; No carotid bruits LYMPHATICS: No lymphadenopathy CARDIAC: RRR, no murmurs, rubs, gallops RESPIRATORY:  Clear to auscultation without rales, wheezing or rhonchi  ABDOMEN: Soft, non-tender, non-distended MUSCULOSKELETAL:  trace edema; No deformity  SKIN: Warm and dry NEUROLOGIC:  Alert and oriented x 3 PSYCHIATRIC:  Normal affect    ASSESSMENT:    1. Pre-op evaluation   2. Primary hypertension   3. Pure hypercholesterolemia   4. Coronary artery calcification     PLAN:    In order of problems listed above:  Preop evaluation, possible spinal/neck fusion being planned.  Prior echo and Myoview are normal.  Patient can proceed with surgical procedure from a cardiac perspective. Hypertension, BP controlled.  Continue Imdur, Lasix as needed. Hyperlipidemia, continue Crestor. Coronary artery calcification on chest CT.  History of coronary vasospasm.  Continue Crestor, Imdur.  No anginal symptoms  Follow-up yearly   Medication Adjustments/Labs and Tests Ordered: Current medicines are reviewed at length with the patient today.  Concerns regarding medicines are outlined above.  Orders Placed This Encounter  Procedures   EKG 12-Lead    Meds ordered this encounter  Medications   furosemide (LASIX) 20 MG tablet    Sig: Take 1 tablet (20 mg total) by mouth daily as needed for edema.    Dispense:  90 tablet    Refill:  1     Patient Instructions  Medication Instructions:  Your physician recommends that you continue on your current medications as directed. Please refer to the Current Medication list given to you today.  *If you need a refill on your cardiac medications before your next appointment, please call your pharmacy*   Lab Work: None ordered If you have labs (blood work) drawn today and  your tests are completely normal, you will receive your results only by: MyChart Message (if you have MyChart) OR A paper copy in the mail If you have any lab test that is abnormal or we need to change your treatment, we will call you to review the results.   Testing/Procedures: None ordered   Follow-Up: At [facility], you and your health needs are our priority.  As part of our continuing mission to provide you with exceptional heart care, we have created designated Provider Care Teams.  These Care Teams  include your primary Cardiologist (physician) and Advanced Practice Providers (APPs -  Physician Assistants and Nurse Practitioners) who all work together to provide you with the care you need, when you need it.  We recommend signing up for the patient portal called "MyChart".  Sign up information is provided on this After Visit Summary.  MyChart is used to connect with patients for Virtual Visits (Telemedicine).  Patients are able to view lab/test results, encounter notes, upcoming appointments, etc.  Non-urgent messages can be sent to your provider as well.   To learn more about what you can do with MyChart, go to [email or web address].    Your next appointment:   1 year(s)  The format for your next appointment:   In Person  Provider:   Kate Sable, MD   Other Instructions     Signed, Kate Sable, MD  05/13/2021 11:34 AM    [place]

## 2021-05-15 ENCOUNTER — Telehealth: Payer: Self-pay | Admitting: Family Medicine

## 2021-05-15 LAB — HM DIABETES FOOT EXAM: HM Diabetic Foot Exam: NORMAL

## 2021-05-15 LAB — HEMOGLOBIN A1C: Hemoglobin A1C: 6.8

## 2021-05-15 NOTE — Telephone Encounter (Signed)
Patient calling in and is scheduled to have spinal fusion surgery. She has been cleared for surgery by Cardiology.    Patient wanting to know if she needs PCP clearance as well?    Please advise  Susi Goslin,cma

## 2021-05-15 NOTE — Telephone Encounter (Signed)
Patient calling in and is scheduled to have spinal fusion surgery. She has been cleared for surgery by Cardiology.   Patient wanting to know if she needs PCP clearance as well?   Please advise

## 2021-05-16 NOTE — Telephone Encounter (Signed)
I called and spoke with the patient and she is seeing the provider about her surgery tomorrow and she will call back with a date.  Rolan Wrightsman,cma

## 2021-05-16 NOTE — Telephone Encounter (Signed)
I would suggest a visit for clearance. When is her surgery?

## 2021-05-17 ENCOUNTER — Telehealth: Payer: Self-pay

## 2021-05-17 ENCOUNTER — Encounter: Payer: Self-pay | Admitting: Family Medicine

## 2021-05-17 DIAGNOSIS — M5412 Radiculopathy, cervical region: Secondary | ICD-10-CM | POA: Diagnosis not present

## 2021-05-18 NOTE — Telephone Encounter (Signed)
Noted  

## 2021-05-19 ENCOUNTER — Other Ambulatory Visit: Payer: Self-pay | Admitting: Family Medicine

## 2021-05-19 DIAGNOSIS — Z760 Encounter for issue of repeat prescription: Secondary | ICD-10-CM

## 2021-05-19 DIAGNOSIS — E785 Hyperlipidemia, unspecified: Secondary | ICD-10-CM

## 2021-05-20 ENCOUNTER — Telehealth: Payer: Self-pay | Admitting: Family Medicine

## 2021-05-20 ENCOUNTER — Other Ambulatory Visit: Payer: Self-pay | Admitting: Family Medicine

## 2021-05-20 NOTE — Telephone Encounter (Signed)
Patient is going to have surgery next Tuesday and was informed that she will need to call her doctor to see when she should stop taking her eloquois and to see if she will need to do a bridge with lovenox shots.Please advise.

## 2021-05-20 NOTE — Telephone Encounter (Signed)
Patient is going to have surgery next Tuesday and was informed that she will need to call her doctor to see when she should stop taking her eloquois and to see if she will need to do a bridge with lovenox shots.Please advise.  Valerie Vazquez,cma

## 2021-05-20 NOTE — Telephone Encounter (Signed)
Ok to refill. Bentyl last refilled on 12/05/19. Pt last seen on 01/14/21.

## 2021-05-20 NOTE — Telephone Encounter (Signed)
Noted  

## 2021-05-20 NOTE — Telephone Encounter (Signed)
Patient is calling in to request a refill on her dicyclomine (BENTYL) 20 MG tablet.Please send it to the pharmacy in [place] on [address]

## 2021-05-21 NOTE — Telephone Encounter (Signed)
I called and informed the patient that the provider stated they will discuss this at her visit on Friday and she understood.  Lavelle Akel,cma

## 2021-05-21 NOTE — Telephone Encounter (Signed)
She has a visit with me on 05/24/2021.  We can go over this during visit.  Please put in the visit notes that we need to discuss a Lovenox bridge.

## 2021-05-21 NOTE — Telephone Encounter (Signed)
Is this something that she has continued to need?  Has she been using it since last year?

## 2021-05-22 DIAGNOSIS — G959 Disease of spinal cord, unspecified: Secondary | ICD-10-CM | POA: Diagnosis not present

## 2021-05-22 MED ORDER — DICYCLOMINE HCL 20 MG PO TABS: ORAL_TABLET | ORAL | 0 refills | Status: DC

## 2021-05-24 ENCOUNTER — Other Ambulatory Visit: Payer: Self-pay

## 2021-05-24 ENCOUNTER — Ambulatory Visit (INDEPENDENT_AMBULATORY_CARE_PROVIDER_SITE_OTHER): Payer: Medicare Other | Admitting: Family Medicine

## 2021-05-24 ENCOUNTER — Encounter: Payer: Self-pay | Admitting: Family Medicine

## 2021-05-24 VITALS — BP 110/80 | HR 90 | Temp 98.5°F | Ht 66.0 in | Wt 211.8 lb

## 2021-05-24 DIAGNOSIS — Z01818 Encounter for other preprocedural examination: Secondary | ICD-10-CM | POA: Insufficient documentation

## 2021-05-24 DIAGNOSIS — Z86711 Personal history of pulmonary embolism: Secondary | ICD-10-CM

## 2021-05-24 DIAGNOSIS — E119 Type 2 diabetes mellitus without complications: Secondary | ICD-10-CM

## 2021-05-24 LAB — COMPREHENSIVE METABOLIC PANEL
ALT: 23 U/L (ref 0–35)
AST: 22 U/L (ref 0–37)
Albumin: 4.4 g/dL (ref 3.5–5.2)
Alkaline Phosphatase: 116 U/L (ref 39–117)
BUN: 6 mg/dL (ref 6–23)
CO2: 26 mEq/L (ref 19–32)
Calcium: 9.8 mg/dL (ref 8.4–10.5)
Chloride: 108 mEq/L (ref 96–112)
Creatinine, Ser: 0.95 mg/dL (ref 0.40–1.20)
GFR: 62.16 mL/min (ref >60.00)
Glucose, Bld: 122 mg/dL — ABNORMAL HIGH (ref 70–99)
Potassium: 4.2 mEq/L (ref 3.5–5.1)
Sodium: 143 mEq/L (ref 135–145)
Total Bilirubin: 0.4 mg/dL (ref 0.2–1.2)
Total Protein: 7.4 g/dL (ref 6.0–8.3)

## 2021-05-24 LAB — CBC
HCT: 40.1 % (ref 36.0–46.0)
Hemoglobin: 13.3 g/dL (ref 12.0–15.0)
MCHC: 33.3 g/dL (ref 30.0–36.0)
MCV: 94.3 fl (ref 78.0–100.0)
Platelets: 307 10*3/uL (ref 150.0–400.0)
RBC: 4.25 Mil/uL (ref 3.87–5.11)
RDW: 13.7 % (ref 11.5–15.5)
WBC: 7.5 10*3/uL (ref 4.0–10.5)

## 2021-05-24 MED ORDER — ENOXAPARIN SODIUM 100 MG/ML IJ SOSY: 1.0000 mg/kg | PREFILLED_SYRINGE | Freq: Two times a day (BID) | INTRAMUSCULAR | 0 refills | Status: DC

## 2021-05-24 NOTE — Progress Notes (Signed)
[name], MD Phone: [phone]  Valerie Vazquez is a 65 y.o. female who presents today for follow-up.  Preoperative medical exam for cervical spine fusion: Patient has seen cardiology for cardiac clearance.  She sees [place] for medical clearance.  She is having cervical spine fusion in [place] by [employer].  She is been having pain radiating down her arms.  She has having chronic neck pain.  She does have diabetes.  She does not check her sugars though reports her A1c through her insurance company was 6.8 last week.  She is on [place].  She reports the orthopedic office advised her to stop those medicines today.  No polyuria or polydipsia.  Rarely gets low blood sugars and she drinks something sweet to resolve that.  She notes no chest pain or shortness of breath.  She is on Eliquis for history of PE and DVT.  She has required bridging in the past given recurrent issues with PE and DVT when coming off of blood thinners.  She reports she was advised that she could not have any blood thinners the day before surgery.  Social History   Tobacco Use  Smoking Status Former   Packs/day: 1.50   Years: 10.00   Pack years: 15.00   Types: Cigarettes   Quit date: 04/12/1979   Years since quitting: 42.1  Smokeless Tobacco Never    Current Outpatient Medications on File Prior to Visit  Medication Sig Dispense Refill   acetaminophen (TYLENOL) 500 MG tablet Take 1 tablet (500 mg total) by mouth every 6 (six) hours as needed. 30 tablet 0   azelastine (ASTELIN) 0.1 % nasal spray Place 2 sprays into both nostrils 2 (two) times daily. Use in each nostril as directed 30 mL 1   baclofen (LIORESAL) 10 MG tablet TAKE 1 TABLET BY MOUTH THREE TIMES A DAY AS NEEDED FOR MUSCLE SPASMS 60 tablet 1   Blood Glucose Monitoring Suppl (GHT BLOOD GLUCOSE MONITOR) w/Device KIT DISPENSE BASED ON PATIENT AND INSURANCE PREFERENCE. USE ONCE DAILY AS DIRECTED. DX CODE E11.9. 1 kit 0   cetirizine (ZYRTEC) 10 MG  tablet TAKE 1 TABLET BY MOUTH EVERY DAY 90 tablet 3   dicyclomine (BENTYL) 20 MG tablet TAKE 1 TABLET BY MOUTH THREE TIMES A DAY AS NEEDED FOR abdominal SPASMS 270 tablet 0   diphenhydrAMINE (BENADRYL) 25 MG tablet Take 1 tablet (25 mg total) by mouth every 6 (six) hours. 12 tablet 0   ELIQUIS 5 MG TABS tablet TAKE 1 TABLET BY MOUTH TWICE A DAY 60 tablet 5   escitalopram (LEXAPRO) 20 MG tablet TAKE 1 TABLET BY MOUTH EVERY DAY 90 tablet 1   estradiol (ESTRACE) 0.1 MG/GM vaginal cream Dispose applicator, use pea size amount 3 times a week at night 42.5 g 12   famotidine (PEPCID) 20 MG tablet TAKE 1 TABLET BY MOUTH EVERYDAY AT BEDTIME 90 tablet 1   fluticasone (FLONASE) 50 MCG/ACT nasal spray Place 2 sprays into both nostrils daily. 16 g 6   furosemide (LASIX) 20 MG tablet Take 1 tablet (20 mg total) by mouth daily as needed for edema. 90 tablet 1   gabapentin (NEURONTIN) 600 MG tablet TAKE 1 TABLET IN THE AM AND 1.5 TABLETS AT BEDTIME 225 tablet 1   isosorbide mononitrate (IMDUR) 30 MG 24 hr tablet Take 30 mg by mouth daily.     JANUVIA 100 MG tablet TAKE 1 TABLET BY MOUTH EVERY DAY 90 tablet 1   JARDIANCE 25 MG TABS tablet TAKE  1 TABLET BY MOUTH EVERY DAY 90 tablet 1   KLOR-CON M20 20 MEQ tablet TAKE 1 TABLET (20 MEQ TOTAL) BY MOUTH DAILY AS NEEDED (TAKE WITH LASIX). 90 tablet 1   levothyroxine (SYNTHROID) 25 MCG tablet TAKE 1 TABLET BY MOUTH BEFORE BREAKFAST 2-3 HOURS BEFORE TAKING ANY OTHER MEDICATION OR SUPPLEMENT 90 tablet 3   lidocaine (LIDODERM) 5 % Place 1 patch onto the skin daily. Remove & Discard patch within 12 hours. 30 patch 0   Multiple Vitamins-Minerals (SUPER THERA VITE M) TABS Take by mouth.     nitroGLYCERIN (NITROSTAT) 0.4 MG SL tablet Place 1 tablet (0.4 mg total) under the tongue every 5 (five) minutes as needed for chest pain. 25 tablet 4   OLANZapine (ZYPREXA) 10 MG tablet TAKE 1 TABLET BY MOUTH EVERYDAY AT BEDTIME 90 tablet 0   ondansetron (ZOFRAN) 4 MG tablet TAKE 1 TABLET  (4 MG TOTAL) BY MOUTH 2 (TWO) TIMES DAILY AS NEEDED FOR NAUSEA OR VOMITING. 30 tablet 0   rosuvastatin (CRESTOR) 20 MG tablet TAKE 1 TABLET BY MOUTH EVERY DAY 90 tablet 1   No current facility-administered medications on file prior to visit.     ROS see history of present illness  Objective  Physical Exam Vitals:   05/24/21 1024  BP: 110/80  Pulse: 90  Temp: 98.5 F (36.9 C)  SpO2: 97%    BP Readings from Last 3 Encounters:  05/24/21 110/80  05/13/21 120/70  01/14/21 130/70   Wt Readings from Last 3 Encounters:  05/24/21 211 lb 12.8 oz (96.1 kg)  05/13/21 215 lb (97.5 kg)  01/30/21 215 lb 3.2 oz (97.6 kg)    Physical Exam Constitutional:      General: She is not in acute distress.    Appearance: She is not diaphoretic.  Cardiovascular:     Rate and Rhythm: Normal rate and regular rhythm.     Heart sounds: Normal heart sounds.  Pulmonary:     Effort: Pulmonary effort is normal.     Breath sounds: Normal breath sounds.  Abdominal:     General: Bowel sounds are normal. There is no distension.     Palpations: Abdomen is soft.     Tenderness: There is no abdominal tenderness. There is no guarding or rebound.  Musculoskeletal:     Right lower leg: No edema.     Left lower leg: No edema.  Skin:    General: Skin is warm and dry.  Neurological:     Mental Status: She is alert.     Assessment/Plan: Please see individual problem list.  Problem List Items Addressed This Visit     Diabetes mellitus without complication ([place])    Well-controlled based on recent A1c.  She will continue with her Januvia 100 mg once daily and Jardiance 25 mg daily.      Relevant Orders   CBC   Comp Met (CMET)   History of pulmonary embolus (PE) - Primary    The patient has been asymptomatic on Eliquis.  She is having a cervical spine fusion and requires bridging with Lovenox.  Her last dose of Eliquis will be the evening of 05/24/2021.  She will take the Lovenox 0.95 mL every 12 hours  with her first dose being on 05/25/2021 in the morning and her last dose being on 05/26/2021 in the evening.  Per her report the surgeon advised no anticoagulation the day prior to the surgery.  She will monitor for any signs of DVT or PE  and if she develops any of these she will seek medical attention immediately.  The surgeon will determine when she can resume her Eliquis following her surgery.      Relevant Medications   enoxaparin (LOVENOX) 100 MG/ML injection   Preoperative examination    Patient presents for preoperative medical exam for cervical spine fusion.  She will have a CBC and CMP drawn.  Her diabetes is adequately controlled.  We have discussed her bridging as outlined under her history of PE problem.  If her lab work is acceptable then she is acceptable medical risk to proceed with the surgery.  She has already received a preoperative exam through her cardiologist.       Return in about 3 months (around 08/23/2021).  This visit occurred during the SARS-CoV-2 public health emergency.  Safety protocols were in place, including screening questions prior to the visit, additional usage of staff PPE, and extensive cleaning of exam room while observing appropriate contact time as indicated for disinfecting solutions.    [name], MD [place]

## 2021-05-24 NOTE — Assessment & Plan Note (Signed)
The patient has been asymptomatic on Eliquis.  She is having a cervical spine fusion and requires bridging with Lovenox.  Her last dose of Eliquis will be the evening of 05/24/2021.  She will take the Lovenox 0.95 mL every 12 hours with her first dose being on 05/25/2021 in the morning and her last dose being on 05/26/2021 in the evening.  Per her report the surgeon advised no anticoagulation the day prior to the surgery.  She will monitor for any signs of DVT or PE and if she develops any of these she will seek medical attention immediately.  The surgeon will determine when she can resume her Eliquis following her surgery.

## 2021-05-24 NOTE — Assessment & Plan Note (Signed)
Patient presents for preoperative medical exam for cervical spine fusion.  She will have a CBC and CMP drawn.  Her diabetes is adequately controlled.  We have discussed her bridging as outlined under her history of PE problem.  If her lab work is acceptable then she is acceptable medical risk to proceed with the surgery.  She has already received a preoperative exam through her cardiologist.

## 2021-05-24 NOTE — Telephone Encounter (Signed)
I called Valerie Vazquez of the specialty hospital and informed her that I faxed the patients medical clearance and she stated she received it.  Kaelea Gathright,cma

## 2021-05-24 NOTE — Telephone Encounter (Signed)
Valerie Vazquez [phone] Fax [phone]  She is calling in from the specialty hospital. States they faxed over an anticoagulation instruction form to be filled out and faxed back on 05/21/21. She was informed that the Patient was seen today.   She states she needs that form faxed back today or a call back today or the Patient's surgery will be cancelled. Was this form received and filled out?

## 2021-05-24 NOTE — Assessment & Plan Note (Signed)
Well-controlled based on recent A1c.  She will continue with her Januvia 100 mg once daily and Jardiance 25 mg daily.

## 2021-05-24 NOTE — Patient Instructions (Addendum)
Nice to see you. You will take your last dose of Eliquis on the evening of 05/24/2021.  You will take your first dose of Lovenox the morning of 05/25/2021.  You will take the Lovenox every 12 hours with your last dose being the evening of 05/26/2021.  You will have no blood thinners on 05/27/2021.

## 2021-05-26 ENCOUNTER — Other Ambulatory Visit: Payer: Self-pay | Admitting: Family

## 2021-05-26 ENCOUNTER — Other Ambulatory Visit: Payer: Self-pay | Admitting: Family Medicine

## 2021-05-26 DIAGNOSIS — F250 Schizoaffective disorder, bipolar type: Secondary | ICD-10-CM

## 2021-05-27 NOTE — Telephone Encounter (Signed)
Rx(s) sent to pharmacy electronically.  

## 2021-05-28 DIAGNOSIS — G959 Disease of spinal cord, unspecified: Secondary | ICD-10-CM | POA: Diagnosis not present

## 2021-05-28 DIAGNOSIS — Z86718 Personal history of other venous thrombosis and embolism: Secondary | ICD-10-CM | POA: Diagnosis not present

## 2021-05-28 DIAGNOSIS — G4733 Obstructive sleep apnea (adult) (pediatric): Secondary | ICD-10-CM | POA: Diagnosis not present

## 2021-05-28 DIAGNOSIS — E785 Hyperlipidemia, unspecified: Secondary | ICD-10-CM | POA: Diagnosis not present

## 2021-05-28 DIAGNOSIS — E7800 Pure hypercholesterolemia, unspecified: Secondary | ICD-10-CM | POA: Diagnosis not present

## 2021-05-28 DIAGNOSIS — F32A Depression, unspecified: Secondary | ICD-10-CM | POA: Diagnosis not present

## 2021-05-28 DIAGNOSIS — E119 Type 2 diabetes mellitus without complications: Secondary | ICD-10-CM | POA: Diagnosis not present

## 2021-05-28 DIAGNOSIS — K224 Dyskinesia of esophagus: Secondary | ICD-10-CM | POA: Diagnosis not present

## 2021-05-28 DIAGNOSIS — G9589 Other specified diseases of spinal cord: Secondary | ICD-10-CM | POA: Diagnosis not present

## 2021-05-28 DIAGNOSIS — K219 Gastro-esophageal reflux disease without esophagitis: Secondary | ICD-10-CM | POA: Diagnosis not present

## 2021-05-28 DIAGNOSIS — I509 Heart failure, unspecified: Secondary | ICD-10-CM | POA: Diagnosis not present

## 2021-05-28 DIAGNOSIS — I2510 Atherosclerotic heart disease of native coronary artery without angina pectoris: Secondary | ICD-10-CM | POA: Diagnosis not present

## 2021-05-28 DIAGNOSIS — Z86711 Personal history of pulmonary embolism: Secondary | ICD-10-CM | POA: Diagnosis not present

## 2021-05-28 DIAGNOSIS — J449 Chronic obstructive pulmonary disease, unspecified: Secondary | ICD-10-CM | POA: Diagnosis not present

## 2021-05-28 DIAGNOSIS — I10 Essential (primary) hypertension: Secondary | ICD-10-CM | POA: Diagnosis not present

## 2021-05-28 DIAGNOSIS — G992 Myelopathy in diseases classified elsewhere: Secondary | ICD-10-CM | POA: Diagnosis not present

## 2021-05-28 DIAGNOSIS — M5001 Cervical disc disorder with myelopathy,  high cervical region: Secondary | ICD-10-CM | POA: Diagnosis not present

## 2021-05-28 DIAGNOSIS — I120 Hypertensive chronic kidney disease with stage 5 chronic kidney disease or end stage renal disease: Secondary | ICD-10-CM | POA: Diagnosis not present

## 2021-05-28 DIAGNOSIS — Z7901 Long term (current) use of anticoagulants: Secondary | ICD-10-CM | POA: Diagnosis not present

## 2021-05-28 DIAGNOSIS — M4802 Spinal stenosis, cervical region: Secondary | ICD-10-CM | POA: Diagnosis not present

## 2021-06-05 ENCOUNTER — Telehealth: Payer: Self-pay | Admitting: Family Medicine

## 2021-06-05 NOTE — Telephone Encounter (Signed)
Patient calling about her dicyclomine (BENTYL) 20 MG tablet. A 270 pill quantity was prescribed but after leaving the hospital form having surgery she only had 70 pills in her bottle.   States she will call [facility] to see if they filled the full amount. She will also call the hospital and see if the nurses there may not have given her all of her medication back when being discharged from the hospital.   For your information. Patient wanted Dr Caryl Bis to be aware as depending on outcome she may need an okay for an early fill in the future.

## 2021-06-05 NOTE — Telephone Encounter (Signed)
Patient called back in and would like for you gto disregard the message below.She was able to find her medicine,it was in a different bottle than it normally comes in.

## 2021-06-08 ENCOUNTER — Other Ambulatory Visit: Payer: Self-pay | Admitting: Family Medicine

## 2021-06-14 DIAGNOSIS — M4802 Spinal stenosis, cervical region: Secondary | ICD-10-CM | POA: Diagnosis not present

## 2021-06-18 ENCOUNTER — Telehealth: Payer: Self-pay

## 2021-06-18 DIAGNOSIS — R1013 Epigastric pain: Secondary | ICD-10-CM

## 2021-06-18 DIAGNOSIS — F250 Schizoaffective disorder, bipolar type: Secondary | ICD-10-CM

## 2021-06-18 DIAGNOSIS — E063 Autoimmune thyroiditis: Secondary | ICD-10-CM

## 2021-06-18 DIAGNOSIS — F411 Generalized anxiety disorder: Secondary | ICD-10-CM

## 2021-06-18 DIAGNOSIS — R110 Nausea: Secondary | ICD-10-CM

## 2021-06-18 DIAGNOSIS — E785 Hyperlipidemia, unspecified: Secondary | ICD-10-CM

## 2021-06-20 MED ORDER — FAMOTIDINE 20 MG PO TABS: ORAL_TABLET | ORAL | 1 refills | Status: DC

## 2021-06-20 MED ORDER — DICYCLOMINE HCL 20 MG PO TABS: ORAL_TABLET | ORAL | 0 refills | Status: DC

## 2021-06-20 MED ORDER — EMPAGLIFLOZIN 25 MG PO TABS: 25.0000 mg | ORAL_TABLET | Freq: Every day | ORAL | 1 refills | Status: DC

## 2021-06-20 MED ORDER — ESCITALOPRAM OXALATE 20 MG PO TABS: 20.0000 mg | ORAL_TABLET | Freq: Every day | ORAL | 1 refills | Status: DC

## 2021-06-20 MED ORDER — LEVOTHYROXINE SODIUM 25 MCG PO TABS: ORAL_TABLET | ORAL | 3 refills | Status: DC

## 2021-06-20 MED ORDER — APIXABAN 5 MG PO TABS: 5.0000 mg | ORAL_TABLET | Freq: Two times a day (BID) | ORAL | 5 refills | Status: DC

## 2021-06-20 MED ORDER — BACLOFEN 10 MG PO TABS: ORAL_TABLET | ORAL | 1 refills | Status: DC

## 2021-06-20 MED ORDER — OLANZAPINE 10 MG PO TABS: ORAL_TABLET | ORAL | 0 refills | Status: DC

## 2021-06-20 MED ORDER — POTASSIUM CHLORIDE CRYS ER 20 MEQ PO TBCR: EXTENDED_RELEASE_TABLET | ORAL | 1 refills | Status: DC

## 2021-06-20 MED ORDER — ONDANSETRON HCL 4 MG PO TABS: 4.0000 mg | ORAL_TABLET | Freq: Two times a day (BID) | ORAL | 0 refills | Status: DC | PRN

## 2021-06-20 MED ORDER — SITAGLIPTIN PHOSPHATE 100 MG PO TABS: 100.0000 mg | ORAL_TABLET | Freq: Every day | ORAL | 1 refills | Status: DC

## 2021-06-20 MED ORDER — ROSUVASTATIN CALCIUM 20 MG PO TABS: 20.0000 mg | ORAL_TABLET | Freq: Every day | ORAL | 1 refills | Status: DC

## 2021-06-20 MED ORDER — GABAPENTIN 600 MG PO TABS: ORAL_TABLET | ORAL | 1 refills | Status: DC

## 2021-06-20 NOTE — Telephone Encounter (Signed)
Please let the patient know that I sent her refills to her new pharmacy.  Please remind her that she needs to see psychiatry for further refills on her Zyprexa.  This is not a medication that I manage long-term for patients.  Please see if she has been able to get into see a psychiatrist.

## 2021-06-20 NOTE — Telephone Encounter (Signed)
Noted  

## 2021-06-20 NOTE — Telephone Encounter (Signed)
I called and spoke with the patient and informed her that her medications was sent to her new pharmacy and she understood.  Patient stated she did talk to a psychiatrist and she will see if they can take her on as a new patient.  Glyn Zendejas,cma

## 2021-07-03 ENCOUNTER — Other Ambulatory Visit: Payer: Self-pay

## 2021-07-03 DIAGNOSIS — Z760 Encounter for issue of repeat prescription: Secondary | ICD-10-CM

## 2021-07-03 DIAGNOSIS — I5032 Chronic diastolic (congestive) heart failure: Secondary | ICD-10-CM

## 2021-07-03 NOTE — Telephone Encounter (Signed)
The Imdur and Lasix need to come from her cardiologist.  Is she still taking the Protonix?  If she is the Protonix can be refilled though the other 2 need to come from cardiology.

## 2021-07-03 NOTE — Progress Notes (Signed)
Error. ng 

## 2021-07-12 DIAGNOSIS — Z4801 Encounter for change or removal of surgical wound dressing: Secondary | ICD-10-CM | POA: Diagnosis not present

## 2021-07-18 ENCOUNTER — Other Ambulatory Visit: Payer: Self-pay | Admitting: Family Medicine

## 2021-07-18 MED ORDER — FUROSEMIDE 20 MG PO TABS: 20.0000 mg | ORAL_TABLET | Freq: Every day | ORAL | 1 refills | Status: DC | PRN

## 2021-07-18 MED ORDER — ISOSORBIDE MONONITRATE ER 30 MG PO TB24: 30.0000 mg | ORAL_TABLET | Freq: Every day | ORAL | 2 refills | Status: DC

## 2021-07-19 ENCOUNTER — Other Ambulatory Visit: Payer: Self-pay | Admitting: Family Medicine

## 2021-07-19 DIAGNOSIS — R110 Nausea: Secondary | ICD-10-CM

## 2021-07-23 ENCOUNTER — Other Ambulatory Visit: Payer: Self-pay | Admitting: Family Medicine

## 2021-07-23 DIAGNOSIS — R110 Nausea: Secondary | ICD-10-CM

## 2021-07-29 ENCOUNTER — Other Ambulatory Visit: Payer: Self-pay | Admitting: Family Medicine

## 2021-08-02 ENCOUNTER — Ambulatory Visit
Admission: RE | Admit: 2021-08-02 | Discharge: 2021-08-02 | Disposition: A | Discharge status: Home / Self Care | Payer: Medicare Other | Source: Ambulatory Visit | Attending: Emergency Medicine | Admitting: Emergency Medicine

## 2021-08-02 ENCOUNTER — Other Ambulatory Visit: Payer: Self-pay

## 2021-08-02 VITALS — BP 114/72 | HR 93 | Temp 99.2°F | Resp 20

## 2021-08-02 DIAGNOSIS — J0100 Acute maxillary sinusitis, unspecified: Secondary | ICD-10-CM

## 2021-08-02 DIAGNOSIS — Z1152 Encounter for screening for COVID-19: Secondary | ICD-10-CM | POA: Diagnosis not present

## 2021-08-02 MED ORDER — DOXYCYCLINE HYCLATE 100 MG PO CAPS: 100.0000 mg | ORAL_CAPSULE | Freq: Two times a day (BID) | ORAL | 0 refills | Status: AC

## 2021-08-02 MED ORDER — BENZONATATE 100 MG PO CAPS: 100.0000 mg | ORAL_CAPSULE | Freq: Three times a day (TID) | ORAL | 0 refills | Status: DC | PRN

## 2021-08-02 NOTE — ED Triage Notes (Signed)
Pt here with URI and fever x 1 week. Cough is keeping her up at night.

## 2021-08-02 NOTE — Discharge Instructions (Addendum)
Take the antibiotic as directed.  Follow up with your primary care provider if your symptoms are not improving.     

## 2021-08-02 NOTE — ED Provider Notes (Signed)
Valerie Vazquez    CSN: [id number] Arrival date & time: 08/02/21  1236      History   Chief Complaint Chief Complaint  Patient presents with   Cough   Nasal Congestion   Sore Throat    HPI Valerie Vazquez is a 65 y.o. female.  Patient presents with cough, congestion, fatigue, chills, subjective fever x8 days.  Treatment at home with OTC cold medication.  She denies rash, shortness of breath, vomiting, diarrhea, or other symptoms.  Her medical history includes hypertension, heart failure, pulmonary embolus, MI, chronic pain, schizoaffective disorder, diabetes.  The history is provided by the patient and medical records.   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   Depression    "major" (09/19/2014)   DVT (deep venous thrombosis) (HCC)    Esophageal spasm    Essential hypertension 10/28/2013   Gallstones    Gastritis    Gastroenteritis    GERD (gastroesophageal reflux disease)    Headache    "couple /month lately" (09/19/2014)   Heart disease 12/09/2016   High blood pressure    High cholesterol    History of blood transfusion 1985   related to hysterectomy   History of nuclear stress test    Myoview 1/19:  EF 65, no ischemia or scar   History of pulmonary embolus (PE) 09/19/2014   Hx of cervical spine surgery 01/29/2016   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 Rule out Paget's disease of the bone with blood work.    Hyperlipidemia 10/28/2013   Hypertension    IBS (irritable bowel syndrome)    Lumbar central spinal stenosis (moderate at L4-5; mild at L2-3 and L3-4)  01/29/2016   Migraine    "a few times/yr" (09/19/2014)   Myocardial infarction (HCC) 10/2010 X 3   "while hospitalized"   Neck pain 01/29/2016   Osteoarthritis    "qwhere; mostly around my joints" (09/19/2014)   Pleural effusion, bilateral 07/31/2015   Pneumonia 04/2014   PTSD (post-traumatic stress disorder)    Pulmonary embolism (HCC) 04/2001; 09/19/2014   "after gallbladder OR; "   Schizoaffective disorder (HCC)    Schizoaffective disorder, depressive type (HCC) 08/28/2016   Sleep apnea    "mild" (09/19/2014)   Snoring    a. sleep study 5/16: No OSA   SOB (shortness of breath) 07/31/2015   Spinal stenosis    Spondylosis    Suicide attempt (HCC) 08/27/2016   Type II diabetes mellitus (HCC)    "dx'd in 2007; lost weight; no RX for ~ 4 yr now" (09/19/2014)   Wheezing 02/25/2017    Patient Active Problem List   Diagnosis Date Noted   Preoperative examination 05/24/2021   Bulimia 01/14/2021   Cervical radiculitis 11/15/2020   Right arm pain 11/12/2020   Gross hematuria 09/14/2020   Bilateral low back pain without sciatica 06/20/2020   Paronychia of great toe of right foot 04/05/2020   Paronychia of great toe, right 02/27/2020   Foraminal stenosis of cervical region 01/31/2020   Cervical fusion syndrome (C5-C7 2014) 01/31/2020  Ingrown toenail 01/27/2020   Chronic diastolic heart failure ([place]) 01/27/2020   Postmenopausal estrogen deficiency 08/12/2019   Cysts of both ovaries 06/15/2019   Left wrist pain 05/20/2019   Allergic rhinitis 01/27/2019   Right hip pain 10/06/2018   Urinary incontinence 10/27/2017   Elevated alkaline phosphatase level 10/27/2017   Subclinical hypothyroidism 08/26/2017   Diabetes mellitus without complication ([place]) 89/16/9450   Right upper quadrant pain 01/12/2017   Plantar fasciitis of left foot 11/07/2016   CAD (coronary artery disease) 09/28/2016   Epigastric discomfort 09/16/2016   Schizoaffective disorder, depressive type ([place]) 08/28/2016    Cervicalgia 01/29/2016   Chronic pain syndrome 01/29/2016   Osteoarthritis of hip (Bilateral) 01/29/2016   Lumbar central spinal stenosis (moderate at L4-5; mild at L2-3 and L3-4) 01/29/2016   Breast pain 10/19/2015   Pleural effusion, bilateral 07/31/2015   Cough 07/31/2015   Esophageal spasm 06/05/2015   History of DVT (deep vein thrombosis) 02/25/2015   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   Constipation 12/12/2014   Coronary vasospasm ([place]) 12/09/2014   History of pulmonary embolus (PE) 09/19/2014   Anxiety and depression 04/13/2014   IBS (irritable bowel syndrome) 04/11/2014   GERD (gastroesophageal reflux disease) 04/11/2014   Insomnia 04/11/2014   Essential hypertension 10/28/2013   Anemia 10/28/2013   Hyperlipidemia 10/28/2013    Past Surgical History:  Procedure Laterality Date   ABDOMINAL HYSTERECTOMY  1985   ANTERIOR CERVICAL DECOMP/DISCECTOMY FUSION  10/2012   in [place] C5-C7    [place] CYST EXCISION Right    BREAST EXCISIONAL BIOPSY Right YRS AGO   NEG   CARDIAC CATHETERIZATION   2000's; 2009   CHOLECYSTECTOMY  2002   COLONOSCOPY WITH PROPOFOL N/A 12/12/2016   Procedure: COLONOSCOPY WITH PROPOFOL;  Surgeon: [name], MD;  Location: [facility] ENDOSCOPY;  Service: Endoscopy;  Laterality: N/A;   COLONOSCOPY WITH PROPOFOL N/A 02/17/2017   Procedure: COLONOSCOPY WITH PROPOFOL;  Surgeon: [name], MD;  Location: [facility] ENDOSCOPY;  Service: Gastroenterology;  Laterality: N/A;   ESOPHAGOGASTRODUODENOSCOPY (EGD) WITH PROPOFOL N/A 02/17/2017   Procedure: ESOPHAGOGASTRODUODENOSCOPY (EGD) WITH PROPOFOL;  Surgeon: [name], MD;  Location: [facility] ENDOSCOPY;  Service: Gastroenterology;  Laterality: N/A;   ESOPHAGOGASTRODUODENOSCOPY (EGD) WITH PROPOFOL N/A 03/07/2019   Procedure: ESOPHAGOGASTRODUODENOSCOPY (EGD) WITH PROPOFOL;  Surgeon: [name], MD;  Location: [facility] ENDOSCOPY;  Service: Gastroenterology;  Laterality: N/A;   EXCISION/RELEASE BURSA HIP  Right 12/1984   HERNIA REPAIR  1985   LEFT HEART CATHETERIZATION WITH CORONARY ANGIOGRAM N/A 12/09/2014   Procedure: LEFT HEART CATHETERIZATION WITH CORONARY ANGIOGRAM;  Surgeon: Burnell Blanks, MD;  Location: [facility] CATH LAB;  Service: Cardiovascular;  Laterality: N/A;   TONSILLECTOMY AND ADENOIDECTOMY  ~ [place] ARTHROPLASTY Left [phone]   UMBILICAL HERNIA REPAIR  1985   VENA CAVA FILTER PLACEMENT  03/2014    OB History     Gravida  6   Para  4   Term  4   Preterm      AB  2   Living  4      SAB  2   IAB      Ectopic      Multiple      Live Births  4            Home Medications    Prior to Admission medications   Medication Sig Start Date End Date Taking? Authorizing Provider  benzonatate (TESSALON) 100 MG capsule Take 1 capsule (  100 mg total) by mouth 3 (three) times daily as needed for cough. 08/02/21  Yes Sharion Balloon, NP  doxycycline (VIBRAMYCIN) 100 MG capsule Take 1 capsule (100 mg total) by mouth 2 (two) times daily for 7 days. 08/02/21 08/09/21 Yes Sharion Balloon, NP  acetaminophen (TYLENOL) 500 MG tablet Take 1 tablet (500 mg total) by mouth every 6 (six) hours as needed. 09/29/18   Laban Emperor, PA-C  apixaban (ELIQUIS) 5 MG TABS tablet Take 1 tablet (5 mg total) by mouth 2 (two) times daily. 06/20/21   [name], MD  azelastine (ASTELIN) 0.1 % nasal spray Place 2 sprays into both nostrils 2 (two) times daily. Use in each nostril as directed 12/07/20   [name], MD  baclofen (LIORESAL) 10 MG tablet TAKE ONE TABLET BY MOUTH THREE TIMES DAILY AS NEEDED FOR MUSCLE SPASM (VIAL) 07/30/21   [name], MD  Blood Glucose Monitoring Suppl (GHT BLOOD GLUCOSE MONITOR) w/Device KIT DISPENSE BASED ON PATIENT AND INSURANCE PREFERENCE. USE ONCE DAILY AS DIRECTED. DX CODE E11.9. 01/14/21   [name], MD  carvedilol (COREG) 3.125 MG tablet Take 3.125 mg by mouth 2 (two) times daily. 06/27/21   [provider]   cetirizine (ZYRTEC) 10 MG tablet TAKE 1 TABLET BY MOUTH EVERY DAY 03/28/21   [name], MD  dicyclomine (BENTYL) 20 MG tablet TAKE 1 TABLET BY MOUTH THREE TIMES A DAY AS NEEDED FOR abdominal SPASMS 06/20/21   [name], MD  diphenhydrAMINE (BENADRYL) 25 MG tablet Take 1 tablet (25 mg total) by mouth every 6 (six) hours. 09/12/16   Jola Schmidt, MD  empagliflozin (JARDIANCE) 25 MG TABS tablet Take 1 tablet (25 mg total) by mouth daily. 06/20/21   [name], MD  escitalopram (LEXAPRO) 20 MG tablet Take 1 tablet (20 mg total) by mouth daily. 06/20/21   [name], MD  estradiol (ESTRACE) 0.1 MG/GM vaginal cream Dispose applicator, use pea size amount 3 times a week at night 09/24/20   Bjorn Loser, MD  famotidine (PEPCID) 20 MG tablet TAKE 1 TABLET BY MOUTH EVERYDAY AT BEDTIME 06/20/21   [name], MD  fluticasone [facility]) 50 MCG/ACT nasal spray Place 2 sprays into both nostrils daily. 12/07/20   [name], MD  furosemide (LASIX) 20 MG tablet Take 1 tablet (20 mg total) by mouth daily as needed for edema. 07/18/21   [name], MD  gabapentin (NEURONTIN) 600 MG tablet TAKE 1 TABLET IN THE AM AND 1.5 TABLETS AT BEDTIME 06/20/21   [name], MD  isosorbide mononitrate (IMDUR) 30 MG 24 hr tablet Take 1 tablet (30 mg total) by mouth daily. 07/18/21   [name], MD  levothyroxine (SYNTHROID) 25 MCG tablet TAKE 1 TABLET BY MOUTH BEFORE BREAKFAST 2-3 HOURS BEFORE TAKING ANY OTHER MEDICATION OR SUPPLEMENT 06/20/21   [name], MD  lidocaine (LIDODERM) 5 % Place 1 patch onto the skin daily. Remove & Discard patch within 12 hours. 10/06/18   [name], MD  Multiple Vitamins-Minerals (SUPER THERA VITE M) TABS Take by mouth.    [provider]  nitroGLYCERIN (NITROSTAT) 0.4 MG SL tablet Place 1 tablet (0.4 mg total) under the tongue every 5 (five) minutes as needed for chest pain. 01/14/21   [name],  MD  OLANZapine (ZYPREXA) 10 MG tablet TAKE 1 TABLET BY MOUTH EVERYDAY AT BEDTIME 06/20/21   [name], MD  ondansetron [facility]) 4  MG tablet TAKE ONE TABLET BY MOUTH TWICE DAILY AS NEEDED FOR NAUSEA OR VOMITING (VIAL) 07/23/21   Glori Luis, MD  potassium chloride SA (KLOR-CON M20) 20 MEQ tablet TAKE 1 TABLET (20 MEQ TOTAL) BY MOUTH DAILY AS NEEDED (TAKE WITH LASIX). 06/20/21   Glori Luis, MD  rosuvastatin (CRESTOR) 20 MG tablet Take 1 tablet (20 mg total) by mouth daily. 06/20/21   Glori Luis, MD  sitaGLIPtin (JANUVIA) 100 MG tablet Take 1 tablet (100 mg total) by mouth daily. 06/20/21   Glori Luis, MD    Family History Family History  Problem Relation Age of Onset   Stroke Mother        Deceased   Lung cancer Father        Deceased   Healthy Daughter    Healthy Son        x 2   Other Son        Suicide   Cancer Brother    Heart attack Neg Hx     Social History Social History   Tobacco Use   Smoking status: Former    Packs/day: 1.50    Years: 10.00    Pack years: 15.00    Types: Cigarettes    Quit date: 04/12/1979    Years since quitting: 42.3   Smokeless tobacco: Never  Vaping Use   Vaping Use: Never used  Substance Use Topics   Alcohol use: No    Alcohol/week: 0.0 standard drinks   Drug use: No     Allergies   Abilify [aripiprazole], Augmentin [amoxicillin-pot clavulanate], Bactrim [sulfamethoxazole-trimethoprim], Ceftin [cefuroxime axetil], Coconut oil, Diclofenac, Dye fdc red [red dye], Indocin [indomethacin], Linaclotide, Lisinopril, Morphine, Morphine and related, Simvastatin, Sulfa antibiotics, Sulfamethoxazole-trimethoprim, Wellbutrin [bupropion], Zetia [ezetimibe], Quetiapine fumarate, and Quetiapine fumarate   Review of Systems Review of Systems  Constitutional:  Positive for chills and fever.  HENT:  Positive for congestion. Negative for ear pain and sore throat.   Respiratory:  Positive for cough. Negative for  shortness of breath.   Cardiovascular:  Negative for chest pain and palpitations.  Gastrointestinal:  Negative for diarrhea and vomiting.  Musculoskeletal:  Negative for back pain.  Skin:  Negative for color change and rash.  All other systems reviewed and are negative.   Physical Exam Triage Vital Signs ED Triage Vitals  Enc Vitals Group     BP      Pulse      Resp      Temp      Temp src      SpO2      Weight      Height      Head Circumference      Peak Flow      Pain Score      Pain Loc      Pain Edu?      Excl. in GC?    No data found.  Updated Vital Signs BP 114/72   Pulse 93   Temp 99.2 F (37.3 C)   Resp 20   SpO2 98%   Visual Acuity Right Eye Distance:   Left Eye Distance:   Bilateral Distance:    Right Eye Near:   Left Eye Near:    Bilateral Near:     Physical Exam Vitals and nursing note reviewed.  Constitutional:      General: She is not in acute distress.    Appearance: She is well-developed. She is not ill-appearing.  HENT:  Head: Normocephalic and atraumatic.     Right Ear: Tympanic membrane normal.     Left Ear: Tympanic membrane normal.     Nose: Congestion present.     Mouth/Throat:     Mouth: Mucous membranes are moist.     Pharynx: Oropharynx is clear.  Cardiovascular:     Rate and Rhythm: Normal rate and regular rhythm.     Heart sounds: Normal heart sounds.  Pulmonary:     Effort: Pulmonary effort is normal. No respiratory distress.     Breath sounds: Normal breath sounds.  Abdominal:     General: Bowel sounds are normal.     Palpations: Abdomen is soft.     Tenderness: There is no abdominal tenderness. There is no guarding or rebound.  Musculoskeletal:     Cervical back: Neck supple.  Skin:    General: Skin is warm and dry.  Neurological:     Mental Status: She is alert.  Psychiatric:        Mood and Affect: Mood normal.        Behavior: Behavior normal.     UC Treatments / Results  Labs (all labs ordered are  listed, but only abnormal results are displayed) Labs Reviewed  COVID-19, FLU A+B NAA    EKG   Radiology No results found.  Procedures Procedures (including critical care time)  Medications Ordered in UC Medications - No data to display  Initial Impression / Assessment and Plan / UC Course  I have reviewed the triage vital signs and the nursing notes.  Pertinent labs & imaging results that were available during my care of the patient were reviewed by me and considered in my medical decision making (see chart for details).    Acute sinusitis.  Patient has been symptomatic for 8 days.  Treating with doxycycline (allergic to multiple medications) and Tessalon Perles.  Instructed her to follow-up with PCP if her symptoms are not improving.  She agrees to care.  Final Clinical Impressions(s) / UC Diagnoses   Final diagnoses:  Encounter for screening for COVID-19  Acute non-recurrent maxillary sinusitis     Discharge Instructions      Take the antibiotic as directed.  Follow up with your primary care provider if your symptoms are not improving.         ED Prescriptions     Medication Sig Dispense Auth. Provider   doxycycline (VIBRAMYCIN) 100 MG capsule Take 1 capsule (100 mg total) by mouth 2 (two) times daily for 7 days. 14 capsule Sharion Balloon, NP   benzonatate (TESSALON) 100 MG capsule Take 1 capsule (100 mg total) by mouth 3 (three) times daily as needed for cough. 21 capsule Sharion Balloon, NP      PDMP not reviewed this encounter.   Sharion Balloon, NP 08/02/21 1356

## 2021-08-03 LAB — COVID-19, FLU A+B NAA
Influenza A, NAA: NOT DETECTED
Influenza B, NAA: NOT DETECTED
SARS-CoV-2, NAA: NOT DETECTED

## 2021-08-23 ENCOUNTER — Other Ambulatory Visit: Payer: Self-pay | Admitting: Family Medicine

## 2021-08-23 DIAGNOSIS — R110 Nausea: Secondary | ICD-10-CM

## 2021-08-27 ENCOUNTER — Ambulatory Visit (INDEPENDENT_AMBULATORY_CARE_PROVIDER_SITE_OTHER): Payer: Medicare Other | Admitting: Family Medicine

## 2021-08-27 ENCOUNTER — Encounter: Payer: Self-pay | Admitting: Family Medicine

## 2021-08-27 ENCOUNTER — Other Ambulatory Visit: Payer: Self-pay

## 2021-08-27 VITALS — BP 110/70 | HR 76 | Temp 98.4°F | Ht 66.0 in | Wt 210.2 lb

## 2021-08-27 DIAGNOSIS — F419 Anxiety disorder, unspecified: Secondary | ICD-10-CM

## 2021-08-27 DIAGNOSIS — E119 Type 2 diabetes mellitus without complications: Secondary | ICD-10-CM

## 2021-08-27 DIAGNOSIS — E039 Hypothyroidism, unspecified: Secondary | ICD-10-CM

## 2021-08-27 DIAGNOSIS — Z1231 Encounter for screening mammogram for malignant neoplasm of breast: Secondary | ICD-10-CM

## 2021-08-27 DIAGNOSIS — F502 Bulimia nervosa: Secondary | ICD-10-CM | POA: Diagnosis not present

## 2021-08-27 DIAGNOSIS — Z5181 Encounter for therapeutic drug level monitoring: Secondary | ICD-10-CM | POA: Diagnosis not present

## 2021-08-27 DIAGNOSIS — J0140 Acute pansinusitis, unspecified: Secondary | ICD-10-CM

## 2021-08-27 DIAGNOSIS — F32A Depression, unspecified: Secondary | ICD-10-CM

## 2021-08-27 DIAGNOSIS — J341 Cyst and mucocele of nose and nasal sinus: Secondary | ICD-10-CM | POA: Insufficient documentation

## 2021-08-27 DIAGNOSIS — R21 Rash and other nonspecific skin eruption: Secondary | ICD-10-CM

## 2021-08-27 DIAGNOSIS — J329 Chronic sinusitis, unspecified: Secondary | ICD-10-CM | POA: Insufficient documentation

## 2021-08-27 DIAGNOSIS — Z23 Encounter for immunization: Secondary | ICD-10-CM | POA: Diagnosis not present

## 2021-08-27 LAB — VITAMIN B12: Vitamin B-12: 794 pg/mL (ref 211–911)

## 2021-08-27 LAB — BASIC METABOLIC PANEL
BUN: 5 mg/dL — ABNORMAL LOW (ref 6–23)
CO2: 22 mEq/L (ref 19–32)
Calcium: 9.1 mg/dL (ref 8.4–10.5)
Chloride: 106 mEq/L (ref 96–112)
Creatinine, Ser: 0.88 mg/dL (ref 0.40–1.20)
GFR: 68.01 mL/min (ref >60.00)
Glucose, Bld: 178 mg/dL — ABNORMAL HIGH (ref 70–99)
Potassium: 3.7 mEq/L (ref 3.5–5.1)
Sodium: 139 mEq/L (ref 135–145)

## 2021-08-27 LAB — MICROALBUMIN / CREATININE URINE RATIO
Creatinine,U: 198.8 mg/dL
Microalb Creat Ratio: 1.4 mg/g (ref 0.0–30.0)
Microalb, Ur: 2.7 mg/dL — ABNORMAL HIGH (ref 0.0–1.9)

## 2021-08-27 LAB — MAGNESIUM: Magnesium: 1.7 mg/dL (ref 1.5–2.5)

## 2021-08-27 LAB — TSH: TSH: 2.47 u[IU]/mL (ref 0.35–5.50)

## 2021-08-27 LAB — VITAMIN D 25 HYDROXY (VIT D DEFICIENCY, FRACTURES): VITD: 54.25 ng/mL (ref 30.00–100.00)

## 2021-08-27 LAB — HEMOGLOBIN A1C: Hgb A1c MFr Bld: 6.6 % — ABNORMAL HIGH (ref 4.6–6.5)

## 2021-08-27 MED ORDER — BLOOD GLUCOSE MONITOR KIT: PACK | 0 refills | Status: AC

## 2021-08-27 MED ORDER — TRIAMCINOLONE ACETONIDE 0.5 % EX OINT
1.0000 | TOPICAL_OINTMENT | Freq: Two times a day (BID) | CUTANEOUS | 0 refills | Status: DC
Start: 2021-08-27 — End: 2021-10-13

## 2021-08-27 NOTE — Addendum Note (Signed)
Addended by: Fulton Mole D on: 08/27/2021 10:48 AM   Modules accepted: Orders

## 2021-08-27 NOTE — Assessment & Plan Note (Signed)
She will continue to see psychiatry for this.

## 2021-08-27 NOTE — Assessment & Plan Note (Signed)
Somewhat worsened.  She will continue to see psychiatry and her therapist.  Valerie Vazquez if she at any point felt unsafe related to her son and she needs to contact the police.

## 2021-08-27 NOTE — Patient Instructions (Signed)
Nice to see you. Please let us know if you are not able to get your prescription for Jardiance. We will check lab work today and contact you with the results. You can use the triamcinolone ointment on your rash.  If you do not have improvement over the next 7 days with this please let us know so we can refer you to dermatology.  You need a 2-day break for every 7 days to use this. Please call [phone] to schedule your mammogram.

## 2021-08-27 NOTE — Assessment & Plan Note (Signed)
Resolved.  She will monitor for recurrent symptoms.

## 2021-08-27 NOTE — Progress Notes (Signed)
[name], MD Phone: [phone]  Valerie Vazquez is a 65 y.o. female who presents today for follow-up.  Anxiety/depression: The patient started seeing psychiatry virtually recently.  She sees somebody through [facility] and she is also seeing a therapist.  They are addressing her anxiety, depression, and bulimia.  She is on Zyprexa and Lexapro.  No SI or HI.  She notes some increased anxiety and depression today as her oldest son is giving her problem.  She did feel unsafe last night and almost called the police.  Sinusitis: Patient was seen at urgent care recently for this.  She reports a negative COVID and flu test.  She finished her antibiotic and feels much better.  DIABETES Disease Monitoring: Blood Sugar ranges-not checking Polyuria/phagia/dipsia- no      Optho- due Medications: Compliance- taking Celesta Gentile, has been off her jardiance waiting on her prescription through mail order Hypoglycemic symptoms-1 time a few days ago she felt shaky and felt as though she was going to pass out.  She drank something sweet and this resolved within 5 to 10 minutes.  This does not occur very often.  HYPOTHYROIDISM Disease Monitoring Weight changes: No skin Changes: Rash as outlined below Heat/Cold intolerance: Chronic cold intolerance  Medication Monitoring Compliance: Taking Synthroid 25 mcg once daily Last TSH:   Lab Results  Component Value Date   TSH 1.81 06/15/2019   Rash: This is on her right forearm.  She also reports some rash on her right elbow.  Notes they itch occasionally.  They have been going on for months.  They are persistent.  No changes to soaps or detergents.  No new medications or supplements.  Patient reports she takes a B complex vitamin, zinc, vitamin D, vitamin C, multivitamin, and magnesium.  She reports other supplements that she cannot remember the names.   Social History   Tobacco Use  Smoking Status Former   Packs/day: 1.50   Years: 10.00   Pack  years: 15.00   Types: Cigarettes   Quit date: 04/12/1979   Years since quitting: 42.4  Smokeless Tobacco Never    Current Outpatient Medications on File Prior to Visit  Medication Sig Dispense Refill   acetaminophen (TYLENOL) 500 MG tablet Take 1 tablet (500 mg total) by mouth every 6 (six) hours as needed. 30 tablet 0   apixaban (ELIQUIS) 5 MG TABS tablet Take 1 tablet (5 mg total) by mouth 2 (two) times daily. 60 tablet 5   azelastine (ASTELIN) 0.1 % nasal spray Place 2 sprays into both nostrils 2 (two) times daily. Use in each nostril as directed 30 mL 1   baclofen (LIORESAL) 10 MG tablet TAKE ONE TABLET BY MOUTH THREE TIMES DAILY AS NEEDED FOR MUSCLE SPASM (VIAL) 60 tablet 1   benzonatate (TESSALON) 100 MG capsule Take 1 capsule (100 mg total) by mouth 3 (three) times daily as needed for cough. 21 capsule 0   Blood Glucose Monitoring Suppl (GHT BLOOD GLUCOSE MONITOR) w/Device KIT DISPENSE BASED ON PATIENT AND INSURANCE PREFERENCE. USE ONCE DAILY AS DIRECTED. DX CODE E11.9. 1 kit 0   carvedilol (COREG) 3.125 MG tablet Take 3.125 mg by mouth 2 (two) times daily.     cetirizine (ZYRTEC) 10 MG tablet TAKE 1 TABLET BY MOUTH EVERY DAY 90 tablet 3   dicyclomine (BENTYL) 20 MG tablet TAKE 1 TABLET BY MOUTH THREE TIMES A DAY AS NEEDED FOR abdominal SPASMS 270 tablet 0   diphenhydrAMINE (BENADRYL) 25 MG tablet Take 1 tablet (25 mg  total) by mouth every 6 (six) hours. 12 tablet 0   empagliflozin (JARDIANCE) 25 MG TABS tablet Take 1 tablet (25 mg total) by mouth daily. 90 tablet 1   escitalopram (LEXAPRO) 20 MG tablet Take 1 tablet (20 mg total) by mouth daily. 90 tablet 1   estradiol (ESTRACE) 0.1 MG/GM vaginal cream Dispose applicator, use pea size amount 3 times a week at night 42.5 g 12   famotidine (PEPCID) 20 MG tablet TAKE 1 TABLET BY MOUTH EVERYDAY AT BEDTIME 90 tablet 1   fluticasone (FLONASE) 50 MCG/ACT nasal spray Place 2 sprays into both nostrils daily. 16 g 6   furosemide (LASIX) 20 MG  tablet Take 1 tablet (20 mg total) by mouth daily as needed for edema. 90 tablet 1   gabapentin (NEURONTIN) 600 MG tablet TAKE 1 TABLET IN THE AM AND 1.5 TABLETS AT BEDTIME 225 tablet 1   isosorbide mononitrate (IMDUR) 30 MG 24 hr tablet Take 1 tablet (30 mg total) by mouth daily. 90 tablet 2   levothyroxine (SYNTHROID) 25 MCG tablet TAKE 1 TABLET BY MOUTH BEFORE BREAKFAST 2-3 HOURS BEFORE TAKING ANY OTHER MEDICATION OR SUPPLEMENT 90 tablet 3   lidocaine (LIDODERM) 5 % Place 1 patch onto the skin daily. Remove & Discard patch within 12 hours. 30 patch 0   Multiple Vitamins-Minerals (SUPER THERA VITE M) TABS Take by mouth.     nitroGLYCERIN (NITROSTAT) 0.4 MG SL tablet Place 1 tablet (0.4 mg total) under the tongue every 5 (five) minutes as needed for chest pain. 25 tablet 4   OLANZapine (ZYPREXA) 10 MG tablet TAKE 1 TABLET BY MOUTH EVERYDAY AT BEDTIME 90 tablet 0   ondansetron (ZOFRAN) 4 MG tablet TAKE ONE TABLET BY MOUTH TWICE DAILY AS NEEDED (FOR NAUSEA OR VOMITING) (VIAL) 30 tablet 0   potassium chloride SA (KLOR-CON M20) 20 MEQ tablet TAKE 1 TABLET (20 MEQ TOTAL) BY MOUTH DAILY AS NEEDED (TAKE WITH LASIX). 90 tablet 1   rosuvastatin (CRESTOR) 20 MG tablet Take 1 tablet (20 mg total) by mouth daily. 90 tablet 1   sitaGLIPtin (JANUVIA) 100 MG tablet Take 1 tablet (100 mg total) by mouth daily. 90 tablet 1   pantoprazole (PROTONIX) 40 MG tablet Take 40 mg by mouth every morning.     No current facility-administered medications on file prior to visit.     ROS see history of present illness  Objective  Physical Exam Vitals:   08/27/21 0950  BP: 110/70  Pulse: 76  Temp: 98.4 F (36.9 C)  SpO2: 97%    BP Readings from Last 3 Encounters:  08/27/21 110/70  08/02/21 114/72  05/24/21 110/80   Wt Readings from Last 3 Encounters:  08/27/21 210 lb 3.2 oz (95.3 kg)  05/24/21 211 lb 12.8 oz (96.1 kg)  05/13/21 215 lb (97.5 kg)    Physical Exam Constitutional:      General: She is not  in acute distress.    Appearance: She is not diaphoretic.  Cardiovascular:     Rate and Rhythm: Normal rate and regular rhythm.     Heart sounds: Normal heart sounds.  Pulmonary:     Effort: Pulmonary effort is normal.     Breath sounds: Normal breath sounds.  Skin:    General: Skin is warm and dry.     Comments: There is a dry patch of hyperkeratotic skin on the dorsal right distal forearm, similarly dry patch of hyperkeratotic skin on her right elbow  Neurological:  Mental Status: She is alert.     Assessment/Plan: Please see individual problem list.  Problem List Items Addressed This Visit     Anxiety and depression    Somewhat worsened.  She will continue to see psychiatry and her therapist.  Durward Fortes if she at any point felt unsafe related to her son and she needs to contact the police.      Bulimia    She will continue to see psychiatry for this.      Diabetes mellitus without complication ([place]) - Primary    Undetermined control.  She will continue Januvia 100 mg once daily.  We will give her a sample of Jardiance 25 mg tablets for her to take until she gets her refill.  We will check an A1c today.  Glucometer sent to the pharmacy.  She will also have a urine microalbumin today.      Relevant Medications   blood glucose meter kit and supplies KIT   Other Relevant Orders   Basic Metabolic Panel (BMET)   HgB A1c   Urine Microalbumin w/creat. ratio   Hypothyroidism    Check TSH.  She will continue Synthroid 25 mcg once daily.      Relevant Orders   TSH   Rash    Appears to be an eczematous rash.  Could be a contact dermatitis.  We will give her a trial of triamcinolone ointment and if not improving over the next week she will let us know.  Advised if she has to use the ointment for longer than a week she needs to take a several day break from the ointment.      Relevant Medications   triamcinolone ointment (KENALOG) 0.5 %   Sinusitis    Resolved.  She will  monitor for recurrent symptoms.      Other Visit Diagnoses     Encounter for screening mammogram for malignant neoplasm of breast       Relevant Orders   MM 3D SCREEN BREAST BILATERAL   Medication monitoring encounter       Relevant Orders   Magnesium   B12   Vitamin D (25 hydroxy)      Given that the patient has been taking multiple supplements we will check Labs to evaluate for overtreatment with supplementation.  Health Maintenance: prevnar 20 given today. The patient will call to schedule her mammogram. She was encouraged to call ophthalmology to set up her yearly diabetic eye exam. I discussed the importance of this yearly exam.   Return in about 3 months (around 11/25/2021) for Diabetes.  This visit occurred during the SARS-CoV-2 public health emergency.  Safety protocols were in place, including screening questions prior to the visit, additional usage of staff PPE, and extensive cleaning of exam room while observing appropriate contact time as indicated for disinfecting solutions.    [name], MD [place]

## 2021-08-27 NOTE — Assessment & Plan Note (Signed)
Undetermined control.  She will continue Januvia 100 mg once daily.  We will give her a sample of Jardiance 25 mg tablets for her to take until she gets her refill.  We will check an A1c today.  Glucometer sent to the pharmacy.  She will also have a urine microalbumin today.

## 2021-08-27 NOTE — Assessment & Plan Note (Signed)
Check TSH.  She will continue Synthroid 25 mcg once daily. 

## 2021-08-27 NOTE — Assessment & Plan Note (Signed)
Appears to be an eczematous rash.  Could be a contact dermatitis.  We will give her a trial of triamcinolone ointment and if not improving over the next week she will let us know.  Advised if she has to use the ointment for longer than a week she needs to take a several day break from the ointment.

## 2021-08-28 ENCOUNTER — Other Ambulatory Visit: Payer: Self-pay | Admitting: Family Medicine

## 2021-09-13 DIAGNOSIS — M542 Cervicalgia: Secondary | ICD-10-CM | POA: Diagnosis not present

## 2021-09-17 ENCOUNTER — Other Ambulatory Visit: Payer: Self-pay | Admitting: Family Medicine

## 2021-09-17 DIAGNOSIS — E785 Hyperlipidemia, unspecified: Secondary | ICD-10-CM

## 2021-09-24 ENCOUNTER — Ambulatory Visit
Admission: RE | Admit: 2021-09-24 | Discharge: 2021-09-24 | Disposition: A | Discharge status: Home / Self Care | Payer: Medicare Other | Source: Ambulatory Visit | Attending: Adult Health | Admitting: Adult Health

## 2021-09-24 ENCOUNTER — Other Ambulatory Visit: Payer: Self-pay

## 2021-09-24 ENCOUNTER — Ambulatory Visit (INDEPENDENT_AMBULATORY_CARE_PROVIDER_SITE_OTHER): Payer: Medicare Other

## 2021-09-24 ENCOUNTER — Ambulatory Visit (INDEPENDENT_AMBULATORY_CARE_PROVIDER_SITE_OTHER): Payer: Medicare Other | Admitting: Adult Health

## 2021-09-24 ENCOUNTER — Telehealth: Payer: Self-pay

## 2021-09-24 ENCOUNTER — Encounter: Payer: Self-pay | Admitting: Adult Health

## 2021-09-24 VITALS — BP 126/76 | HR 78 | Temp 97.7°F | Resp 16 | Ht 66.0 in | Wt 211.0 lb

## 2021-09-24 DIAGNOSIS — M79604 Pain in right leg: Secondary | ICD-10-CM

## 2021-09-24 DIAGNOSIS — L989 Disorder of the skin and subcutaneous tissue, unspecified: Secondary | ICD-10-CM

## 2021-09-24 DIAGNOSIS — M1611 Unilateral primary osteoarthritis, right hip: Secondary | ICD-10-CM | POA: Diagnosis not present

## 2021-09-24 DIAGNOSIS — G8929 Other chronic pain: Secondary | ICD-10-CM | POA: Diagnosis not present

## 2021-09-24 DIAGNOSIS — M25551 Pain in right hip: Secondary | ICD-10-CM | POA: Diagnosis not present

## 2021-09-24 DIAGNOSIS — M79661 Pain in right lower leg: Secondary | ICD-10-CM | POA: Diagnosis not present

## 2021-09-24 DIAGNOSIS — M25561 Pain in right knee: Secondary | ICD-10-CM

## 2021-09-24 DIAGNOSIS — M79651 Pain in right thigh: Secondary | ICD-10-CM | POA: Diagnosis not present

## 2021-09-24 DIAGNOSIS — Z8739 Personal history of other diseases of the musculoskeletal system and connective tissue: Secondary | ICD-10-CM

## 2021-09-24 DIAGNOSIS — M1711 Unilateral primary osteoarthritis, right knee: Secondary | ICD-10-CM | POA: Diagnosis not present

## 2021-09-24 DIAGNOSIS — M545 Low back pain, unspecified: Secondary | ICD-10-CM | POA: Diagnosis not present

## 2021-09-24 LAB — COMPREHENSIVE METABOLIC PANEL
ALT: 22 U/L (ref 0–35)
AST: 20 U/L (ref 0–37)
Albumin: 4.1 g/dL (ref 3.5–5.2)
Alkaline Phosphatase: 124 U/L — ABNORMAL HIGH (ref 39–117)
BUN: 7 mg/dL (ref 6–23)
CO2: 26 mEq/L (ref 19–32)
Calcium: 9.3 mg/dL (ref 8.4–10.5)
Chloride: 107 mEq/L (ref 96–112)
Creatinine, Ser: 0.89 mg/dL (ref 0.40–1.20)
GFR: 67.06 mL/min (ref >60.00)
Glucose, Bld: 108 mg/dL — ABNORMAL HIGH (ref 70–99)
Potassium: 3.9 mEq/L (ref 3.5–5.1)
Sodium: 140 mEq/L (ref 135–145)
Total Bilirubin: 0.4 mg/dL (ref 0.2–1.2)
Total Protein: 6.8 g/dL (ref 6.0–8.3)

## 2021-09-24 LAB — CBC WITH DIFFERENTIAL/PLATELET
Basophils Absolute: 0 10*3/uL (ref 0.0–0.1)
Basophils Relative: 0.5 % (ref 0.0–3.0)
Eosinophils Absolute: 0.2 10*3/uL (ref 0.0–0.7)
Eosinophils Relative: 2.9 % (ref 0.0–5.0)
HCT: 39.5 % (ref 36.0–46.0)
Hemoglobin: 13.1 g/dL (ref 12.0–15.0)
Lymphocytes Relative: 37 % (ref 12.0–46.0)
Lymphs Abs: 2.7 10*3/uL (ref 0.7–4.0)
MCHC: 33.1 g/dL (ref 30.0–36.0)
MCV: 96.7 fl (ref 78.0–100.0)
Monocytes Absolute: 0.4 10*3/uL (ref 0.1–1.0)
Monocytes Relative: 6 % (ref 3.0–12.0)
Neutro Abs: 3.9 10*3/uL (ref 1.4–7.7)
Neutrophils Relative %: 53.6 % (ref 43.0–77.0)
Platelets: 301 10*3/uL (ref 150.0–400.0)
RBC: 4.09 Mil/uL (ref 3.87–5.11)
RDW: 13.6 % (ref 11.5–15.5)
WBC: 7.3 10*3/uL (ref 4.0–10.5)

## 2021-09-24 NOTE — Patient Instructions (Signed)
Hip Pain The hip is the joint between the upper legs and the lower pelvis. The bones, cartilage, tendons, and muscles of your hip joint support your body and allow you to move around. Hip pain can range from a minor ache to severe pain in one or both of your hips. The pain may be felt on the inside of the hip joint near the groin, or on the outside near the buttocks and upper thigh. You may also have swelling or stiffness in your hip area. Follow these instructions at home: Managing pain, stiffness, and swelling   If directed, put ice on the painful area. To do this: Put ice in a plastic bag. Place a towel between your skin and the bag. Leave the ice on for 20 minutes, 2-3 times a day. If directed, apply heat to the affected area as often as told by your health care provider. Use the heat source that your health care provider recommends, such as a moist heat pack or a heating pad. Place a towel between your skin and the heat source. Leave the heat on for 20-30 minutes. Remove the heat if your skin turns bright red. This is especially important if you are unable to feel pain, heat, or cold. You may have a greater risk of getting burned. Activity Do exercises as told by your health care provider. Avoid activities that cause pain. General instructions  Take over-the-counter and prescription medicines only as told by your health care provider. Keep a journal of your symptoms. Write down: How often you have hip pain. The location of your pain. What the pain feels like. What makes the pain worse. Sleep with a pillow between your legs on your most comfortable side. Keep all follow-up visits as told by your health care provider. This is important. Contact a health care provider if: You cannot put weight on your leg. Your pain or swelling continues or gets worse after one week. It gets harder to walk. You have a fever. Get help right away if: You fall. You have a sudden increase in pain and  swelling in your hip. Your hip is red or swollen or very tender to touch. Summary Hip pain can range from a minor ache to severe pain in one or both of your hips. The pain may be felt on the inside of the hip joint near the groin, or on the outside near the buttocks and upper thigh. Avoid activities that cause pain. Write down how often you have hip pain, the location of the pain, what makes it worse, and what it feels like. This information is not intended to replace advice given to you by your health care provider. Make sure you discuss any questions you have with your health care provider. Document Revised: 01/03/2019 Document Reviewed: 01/03/2019 Elsevier Patient Education  [place]. Acute Knee Pain, Adult Acute knee pain is sudden and may be caused by damage, swelling, or irritation of the muscles and tissues that support the knee. Pain may result from: A fall. An injury to the knee from twisting motions. A hit to the knee. Infection. Acute knee pain may go away on its own with time and rest. If it does not, your health care provider may order tests to find the cause of the pain. These may include: Imaging tests, such as an X-ray, MRI, CT scan, or ultrasound. Joint aspiration. In this test, fluid is removed from the knee and evaluated. Arthroscopy. In this test, a lighted tube is inserted  into the knee and an image is projected onto a TV screen. Biopsy. In this test, a sample of tissue is removed from the body and studied under a microscope. Follow these instructions at home: If you have a knee sleeve or brace:  Wear the knee sleeve or brace as told by your health care provider. Remove it only as told by your health care provider. Loosen it if your toes tingle, become numb, or turn cold and blue. Keep it clean. If the knee sleeve or brace is not waterproof: Do not let it get wet. Cover it with a watertight covering when you take a bath or shower. Activity Rest your  knee. Do not do things that cause pain or make pain worse. Avoid high-impact activities or exercises, such as running, jumping rope, or doing jumping jacks. Work with a physical therapist to make a safe exercise program, as recommended by your health care provider. Do exercises as told by your physical therapist. Managing pain, stiffness, and swelling  If directed, put ice on the affected knee. To do this: If you have a removable knee sleeve or brace, remove it as told by your health care provider. Put ice in a plastic bag. Place a towel between your skin and the bag. Leave the ice on for 20 minutes, 2-3 times a day. Remove the ice if your skin turns bright red. This is very important. If you cannot feel pain, heat, or cold, you have a greater risk of damage to the area. If directed, use an elastic bandage to put pressure (compression) on your injured knee. This may control swelling, give support, and help with discomfort. Raise (elevate) your knee above the level of your heart while you are sitting or lying down. Sleep with a pillow under your knee. General instructions Take over-the-counter and prescription medicines only as told by your health care provider. Do not use any products that contain nicotine or tobacco, such as cigarettes, e-cigarettes, and chewing tobacco. If you need help quitting, ask your health care provider. If you are overweight, work with your health care provider and a dietitian to set a weight-loss goal that is healthy and reasonable for you. Extra weight can put pressure on your knee. Pay attention to any changes in your symptoms. Keep all follow-up visits. This is important. Contact a health care provider if: Your knee pain continues, changes, or gets worse. You have a fever along with knee pain. Your knee feels warm to the touch or is red. Your knee buckles or locks up. Get help right away if: Your knee swells, and the swelling becomes worse. You cannot move  your knee. You have severe pain in your knee that cannot be managed with pain medicine. Summary Acute knee pain can be caused by a fall, an injury, an infection, or damage, swelling, or irritation of the tissues that support your knee. Your health care provider may perform tests to find out the cause of the pain. Pay attention to any changes in your symptoms. Relieve your pain with rest, medicines, light activity, and the use of ice. Get help right away if your knee swells, you cannot move your knee, or you have severe pain that cannot be managed with medicine. This information is not intended to replace advice given to you by your health care provider. Make sure you discuss any questions you have with your health care provider. Document Revised: 02/01/2020 Document Reviewed: 02/01/2020 Elsevier Patient Education  2022 [employer].

## 2021-09-24 NOTE — Telephone Encounter (Signed)
Juliann Pulse RN notified patient today 09/24/21 4:25 pm right leg is negative for DVT.

## 2021-09-24 NOTE — Progress Notes (Addendum)
Post surgical changes/ hardware  seen in left hip. Dies have moderate right joint space narrowing where her femur ( upper leg bone) fits into the hip.No acute fracture. Will need orthopedics referral. Leg pain likely related to pain out of lumbar spine and right hip. CBC is within normal  CMP with has mildly elevated glucose non fasting, alkaline phosphatase is elevated, recommend repeat fasting CMP in 1 month- please add and schedule.

## 2021-09-24 NOTE — Progress Notes (Signed)
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 Recommend referral to orthopedics if she does not have one ? Please let me know her preference for referral please ?

## 2021-09-24 NOTE — Telephone Encounter (Signed)
Advised patient [place] negative for DVT.

## 2021-09-24 NOTE — Progress Notes (Signed)
Dr. Adora Fridge,  Just verifying this was right lower extremity the result note says left DVT negative.  Thanks

## 2021-09-24 NOTE — Progress Notes (Signed)
Acute Office Visit  Subjective:    Patient ID: Valerie Vazquez, female    DOB: Apr 17, 1956, 66 y.o.   MRN: [id number]  Chief Complaint  Patient presents with   Acute Visit    Pt arrives to office about a spot in her R knee which has been there for about 1 yr, denies any pain unless she moves her leg a certain way. Also c/o R inner groin pain which started shortly after. Had cervix removed 05/28/21    HPI Patient is in today for  an area above your right knee and also having inner groin pain.  If she holds her knee in a certain way her entire knee starts to have pain.  Lower back pain.  Right hip pain.   Denies any injury.  Onset of symptoms a year ago.   Denies any loss of bowel or bladder control.  Denies saddle paresthesias.  Denies radiculopathy/ paresthesias.   Mild skin darkening on top of right anterior knee area that itches and burns. She request dermatology for skin area of concern reports has been present over 1 year.   Patient  denies any fever, body aches,chills, rash, chest pain, shortness of breath, nausea, vomiting, or diarrhea.  Denies dizziness, lightheadedness, pre syncopal or syncopal episodes.     Past Medical History:  Diagnosis Date   Acute right-sided low back pain with right-sided sciatica 10/23/2017   Anemia    Bilateral renal cysts    CAD (coronary artery disease) 09/28/2016   Chest pain 10/14/2015   Chronic back pain    "upper and lower" (09/19/2014)   Chronic shoulder pain (Location of Primary Source of Pain) (Left) 01/29/2016   Colitis 04/08/2016   Colon polyp 01/30/2017   Coronary vasospasm ([place]) 12/09/2014   Depression    "major" (09/19/2014)   DVT (deep venous thrombosis) (HCC)    Esophageal spasm    Essential hypertension 10/28/2013   Gallstones    Gastritis    Gastroenteritis    GERD (gastroesophageal reflux disease)    Headache    "couple /month lately" (09/19/2014)   Heart disease 12/09/2016   High blood pressure    High cholesterol     History of blood transfusion 1985   related to hysterectomy   History of nuclear stress test    Myoview 1/19:  EF 65, no ischemia or scar   History of pulmonary embolus (PE) 09/19/2014   Hx of cervical spine surgery 01/29/2016   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 Rule out Paget's disease of the bone with blood work.    Hyperlipidemia 10/28/2013   Hypertension    IBS (irritable bowel syndrome)    Lumbar central spinal stenosis (moderate at L4-5; mild at L2-3 and L3-4) 01/29/2016   Migraine    "a few times/yr" (09/19/2014)   Myocardial infarction ([place]) 10/2010 X 3   "while hospitalized"   Neck pain 01/29/2016   Osteoarthritis    "qwhere; mostly around my joints" (09/19/2014)   Pleural effusion, bilateral 07/31/2015   Pneumonia 04/2014   PTSD (post-traumatic stress disorder)    Pulmonary embolism ([place]) 04/2001; 09/19/2014   "after gallbladder OR; "   Schizoaffective disorder ([place])    Schizoaffective disorder, depressive type ([place]) 08/28/2016   Sleep apnea    "mild" (09/19/2014)   Snoring    a. sleep study 5/16: No OSA   SOB (shortness of breath) 07/31/2015   Spinal stenosis    Spondylosis    Suicide attempt ([place]) 08/27/2016  Type II diabetes mellitus ([place])    "dx'd in Nov 18, 2005; lost weight; no RX for ~ 4 yr now" (09/19/2014)   Wheezing 02/25/2017    Past Surgical History:  Procedure Laterality Date   ABDOMINAL HYSTERECTOMY  1985   ANTERIOR CERVICAL DECOMP/DISCECTOMY FUSION  November 18, 2012   in [place] C5-C7    APPENDECTOMY  1985   BACK SURGERY     BREAST CYST EXCISION Right    BREAST EXCISIONAL BIOPSY Right YRS AGO   NEG   CARDIAC CATHETERIZATION   2000's; 11/19/07   CHOLECYSTECTOMY  2002   COLONOSCOPY WITH PROPOFOL N/A 12/12/2016   Procedure: COLONOSCOPY WITH PROPOFOL;  Surgeon: [name], MD;  Location: [facility] ENDOSCOPY;  Service: Endoscopy;  Laterality: N/A;   COLONOSCOPY WITH PROPOFOL N/A 02/17/2017   Procedure: COLONOSCOPY WITH PROPOFOL;  Surgeon: [name],  MD;  Location: [facility] ENDOSCOPY;  Service: Gastroenterology;  Laterality: N/A;   ESOPHAGOGASTRODUODENOSCOPY (EGD) WITH PROPOFOL N/A 02/17/2017   Procedure: ESOPHAGOGASTRODUODENOSCOPY (EGD) WITH PROPOFOL;  Surgeon: [name], MD;  Location: [facility] ENDOSCOPY;  Service: Gastroenterology;  Laterality: N/A;   ESOPHAGOGASTRODUODENOSCOPY (EGD) WITH PROPOFOL N/A 03/07/2019   Procedure: ESOPHAGOGASTRODUODENOSCOPY (EGD) WITH PROPOFOL;  Surgeon: [name], MD;  Location: [facility] ENDOSCOPY;  Service: Gastroenterology;  Laterality: N/A;   EXCISION/RELEASE BURSA HIP Right 12/1984   HERNIA REPAIR  1985   LEFT HEART CATHETERIZATION WITH CORONARY ANGIOGRAM N/A 12/09/2014   Procedure: LEFT HEART CATHETERIZATION WITH CORONARY ANGIOGRAM;  Surgeon: Burnell Blanks, MD;  Location: [facility] CATH LAB;  Service: Cardiovascular;  Laterality: N/A;   TONSILLECTOMY AND ADENOIDECTOMY  ~ 1968   TOTAL HIP ARTHROPLASTY Left [id number]   UMBILICAL HERNIA REPAIR  1985   VENA CAVA FILTER PLACEMENT  03/2014    Family History  Problem Relation Age of Onset   Stroke Mother        Deceased   Lung cancer Father        Deceased   Healthy Daughter    Healthy Son        x 2   Other Son        Suicide   Cancer Brother    Heart attack Neg Hx     Social History   Socioeconomic History   Marital status: Divorced    Spouse name: Not on file   Number of children: Not on file   Years of education: Not on file   Highest education level: Not on file  Occupational History   Not on file  Tobacco Use   Smoking status: Former    Packs/day: 1.50    Years: 10.00    Pack years: 15.00    Types: Cigarettes    Quit date: 04/12/1979    Years since quitting: 42.4   Smokeless tobacco: Never  Vaping Use   Vaping Use: Never used  Substance and Sexual Activity   Alcohol use: No    Alcohol/week: 0.0 standard drinks   Drug use: No   Sexual activity: Not Currently  Other Topics Concern   Not on file  Social History Narrative   Lives with  fiancee in an apartment on the first floor.  Has 3 grown children.  One child passed away.  On disability 11/19/1994 - 2000, 2007 - current.     Education: some college.   Social Determinants of Health   Financial Resource Strain: Low Risk    Difficulty of Paying Living Expenses: Not hard at all  Food Insecurity: No Food Insecurity   Worried About Charity fundraiser  in the Last Year: Never true   [place] in the Last Year: Never true  Transportation Needs: No Transportation Needs   Lack of Transportation (Medical): No   Lack of Transportation (Non-Medical): No  Physical Activity: Unknown   Days of Exercise per Week: 0 days   Minutes of Exercise per Session: Not on file  Stress: No Stress Concern Present   Feeling of Stress : Not at all  Social Connections: Unknown   Frequency of Communication with Friends and Family: More than three times a week   Frequency of Social Gatherings with Friends and Family: More than three times a week   Attends Religious Services: Not on Electrical engineer or Organizations: Not on file   Attends Archivist Meetings: Not on file   Marital Status: Not on file  Intimate Partner Violence: Not At Risk   Fear of Current or Ex-Partner: No   Emotionally Abused: No   Physically Abused: No   Sexually Abused: No    Outpatient Medications Prior to Visit  Medication Sig Dispense Refill   acetaminophen (TYLENOL) 500 MG tablet Take 1 tablet (500 mg total) by mouth every 6 (six) hours as needed. 30 tablet 0   apixaban (ELIQUIS) 5 MG TABS tablet Take 1 tablet (5 mg total) by mouth 2 (two) times daily. 60 tablet 5   azelastine (ASTELIN) 0.1 % nasal spray Place 2 sprays into both nostrils 2 (two) times daily. Use in each nostril as directed 30 mL 1   baclofen (LIORESAL) 10 MG tablet TAKE ONE TABLET BY MOUTH THREE TIMES DAILY AS NEEDED FOR MUSCLE SPASM (VIAL) 60 tablet 1   benzonatate (TESSALON) 100 MG capsule Take 1 capsule (100 mg total) by  mouth 3 (three) times daily as needed for cough. 21 capsule 0   blood glucose meter kit and supplies KIT Dispense based on patient and insurance preference. Use once daily fasting. Dx code E11.9. 1 each 0   Blood Glucose Monitoring Suppl (GHT BLOOD GLUCOSE MONITOR) w/Device KIT DISPENSE BASED ON PATIENT AND INSURANCE PREFERENCE. USE ONCE DAILY AS DIRECTED. DX CODE E11.9. 1 kit 0   carvedilol (COREG) 3.125 MG tablet Take 3.125 mg by mouth 2 (two) times daily.     cetirizine (ZYRTEC) 10 MG tablet TAKE 1 TABLET BY MOUTH EVERY DAY 90 tablet 3   dicyclomine (BENTYL) 20 MG tablet TAKE 1 TABLET BY MOUTH THREE TIMES A DAY AS NEEDED FOR abdominal SPASMS 270 tablet 0   diphenhydrAMINE (BENADRYL) 25 MG tablet Take 1 tablet (25 mg total) by mouth every 6 (six) hours. 12 tablet 0   empagliflozin (JARDIANCE) 25 MG TABS tablet Take 1 tablet (25 mg total) by mouth daily. 90 tablet 1   escitalopram (LEXAPRO) 20 MG tablet TAKE 1 TABLET BY MOUTH EVERY DAY 90 tablet 1   estradiol (ESTRACE) 0.1 MG/GM vaginal cream Dispose applicator, use pea size amount 3 times a week at night 42.5 g 12   famotidine (PEPCID) 20 MG tablet TAKE 1 TABLET BY MOUTH EVERYDAY AT BEDTIME 90 tablet 1   fluticasone (FLONASE) 50 MCG/ACT nasal spray Place 2 sprays into both nostrils daily. 16 g 6   furosemide (LASIX) 20 MG tablet Take 1 tablet (20 mg total) by mouth daily as needed for edema. 90 tablet 1   gabapentin (NEURONTIN) 600 MG tablet TAKE 1 TABLET IN THE AM AND 1.5 TABLETS AT BEDTIME 225 tablet 1   isosorbide mononitrate (IMDUR) 30 MG 24  hr tablet Take 1 tablet (30 mg total) by mouth daily. 90 tablet 2   levothyroxine (SYNTHROID) 25 MCG tablet TAKE 1 TABLET BY MOUTH BEFORE BREAKFAST 2-3 HOURS BEFORE TAKING ANY OTHER MEDICATION OR SUPPLEMENT 90 tablet 3   lidocaine (LIDODERM) 5 % Place 1 patch onto the skin daily. Remove & Discard patch within 12 hours. 30 patch 0   Multiple Vitamins-Minerals (SUPER THERA VITE M) TABS Take by mouth.      nitroGLYCERIN (NITROSTAT) 0.4 MG SL tablet Place 1 tablet (0.4 mg total) under the tongue every 5 (five) minutes as needed for chest pain. 25 tablet 4   OLANZapine (ZYPREXA) 10 MG tablet TAKE 1 TABLET BY MOUTH EVERYDAY AT BEDTIME (Patient taking differently: Take 15 mg by mouth at bedtime. TAKE 1 TABLET BY MOUTH EVERYDAY AT BEDTIME) 90 tablet 0   ondansetron (ZOFRAN) 4 MG tablet TAKE ONE TABLET BY MOUTH TWICE DAILY AS NEEDED (FOR NAUSEA OR VOMITING) (VIAL) 30 tablet 0   pantoprazole (PROTONIX) 40 MG tablet Take 40 mg by mouth every morning.     potassium chloride SA (KLOR-CON M20) 20 MEQ tablet TAKE 1 TABLET (20 MEQ TOTAL) BY MOUTH DAILY AS NEEDED (TAKE WITH LASIX). 90 tablet 1   rosuvastatin (CRESTOR) 20 MG tablet Take 1 tablet (20 mg total) by mouth daily. 90 tablet 1   sitaGLIPtin (JANUVIA) 100 MG tablet Take 1 tablet (100 mg total) by mouth daily. 90 tablet 1   triamcinolone ointment (KENALOG) 0.5 % Apply 1 application topically 2 (two) times daily. 30 g 0   No facility-administered medications prior to visit.    Allergies  Allergen Reactions   Abilify [Aripiprazole] Other (See Comments)    Jerking movement, slow motor skills   Augmentin [Amoxicillin-Pot Clavulanate] Diarrhea and Nausea And Vomiting   Bactrim [Sulfamethoxazole-Trimethoprim] Diarrhea   Ceftin [Cefuroxime Axetil] Diarrhea   Coconut Oil     Rash    Diclofenac Other (See Comments)     Muscle Pain   Dye Fdc Red [Red Dye]     "hair dye"   Indocin [Indomethacin] Other (See Comments)    Migraines    Linaclotide Diarrhea   Lisinopril Diarrhea and Other (See Comments)    Other reaction(s): Dizziness, Vomiting   Morphine Other (See Comments)    Chest Tightness   Morphine And Related Other (See Comments)    Chest tightness    Simvastatin Other (See Comments)    Muscle Pain   Sulfa Antibiotics Diarrhea   Sulfamethoxazole-Trimethoprim Diarrhea   Wellbutrin [Bupropion] Other (See Comments)    Jerking movement Jerking  movement Slurred speech   Zetia [Ezetimibe]     Diarrhea    Quetiapine Fumarate Palpitations   Quetiapine Fumarate Palpitations    Review of Systems  Constitutional: Negative.   HENT: Negative.    Respiratory: Negative.    Cardiovascular: Negative.   Gastrointestinal: Negative.   Genitourinary: Negative.   Musculoskeletal:  Positive for arthralgias and back pain. Negative for gait problem, joint swelling, myalgias, neck pain and neck stiffness.  Neurological: Negative.   Psychiatric/Behavioral: Negative.        Objective:    Physical Exam Musculoskeletal:     Cervical back: Normal.     Thoracic back: Normal.     Lumbar back: Normal.     Right hip: Tenderness and bony tenderness present. No crepitus. Normal strength.     Right upper leg: Normal.     Left upper leg: Normal.     Right knee: Bony tenderness (anterior  knee) present. No effusion or crepitus. Normal patellar mobility. Normal pulse.     Instability Tests: Anterior drawer test negative. Posterior drawer test negative. Medial McMurray test negative.     Left knee: Normal. Normal pulse.     Right lower leg: Normal.     Left lower leg: Normal.     Right ankle: Normal.     Left ankle: Normal.       Legs:  Skin:    General: Skin is warm.     Findings: Rash (slightly hyperpigmented 64mm arae anterior knee, no warmth, no erythema.) present. No erythema.  Neurological:     Mental Status: She is oriented to person, place, and time.     General: Appearance:    Obese female in no acute distress  Eyes:    PERRL, conjunctiva/corneas clear, EOM's intact       Lungs:     Clear to auscultation bilaterally, respirations unlabored  Heart:    Normal heart rate. Normal rhythm. No murmurs, rubs, or gallops.    MS:   All extremities are intact.    Neurologic:   Awake, alert, oriented x 3. No apparent focal neurological           defect.     BP 126/76 (BP Location: Left Arm, Patient Position: Sitting, Cuff Size: Large)     Pulse 78    Temp 97.7 F (36.5 C) (Oral)    Resp 16    Ht $R'5\' 6"'dN$  (1.676 m)    Wt 211 lb (95.7 kg)    SpO2 97%    BMI 34.06 kg/m  Wt Readings from Last 3 Encounters:  09/24/21 211 lb (95.7 kg)  08/27/21 210 lb 3.2 oz (95.3 kg)  05/24/21 211 lb 12.8 oz (96.1 kg)    Health Maintenance Due  Topic Date Due   OPHTHALMOLOGY EXAM  10/27/2020    There are no preventive care reminders to display for this patient.   Lab Results  Component Value Date   TSH 2.47 08/27/2021   Lab Results  Component Value Date   WBC 7.3 09/24/2021   HGB 13.1 09/24/2021   HCT 39.5 09/24/2021   MCV 96.7 09/24/2021   PLT 301.0 09/24/2021   Lab Results  Component Value Date   NA 140 09/24/2021   K 3.9 09/24/2021   CO2 26 09/24/2021   GLUCOSE 108 (H) 09/24/2021   BUN 7 09/24/2021   CREATININE 0.89 09/24/2021   BILITOT 0.4 09/24/2021   ALKPHOS 124 (H) 09/24/2021   AST 20 09/24/2021   ALT 22 09/24/2021   PROT 6.8 09/24/2021   ALBUMIN 4.1 09/24/2021   CALCIUM 9.3 09/24/2021   ANIONGAP 9 09/06/2020   GFR 67.06 09/24/2021   Lab Results  Component Value Date   CHOL 169 03/12/2020   Lab Results  Component Value Date   HDL 40.00 03/12/2020   Lab Results  Component Value Date   LDLCALC 110 (H) 03/12/2020   Lab Results  Component Value Date   TRIG 96.0 03/12/2020   Lab Results  Component Value Date   CHOLHDL 4 03/12/2020   Lab Results  Component Value Date   HGBA1C 6.6 (H) 08/27/2021       Assessment & Plan:   Problem List Items Addressed This Visit       Musculoskeletal and Integument   Skin lesion of lower extremity- right knee    Relevant Orders   Ambulatory referral to Dermatology     Other   Pain of  right lower extremity   Relevant Orders   US Venous Img Lower Unilateral Right (DVT) (Completed)   Pain in right thigh - Primary   Relevant Orders   DG HIPS BILAT WITH PELVIS 3-4 VIEWS (Completed)   Chronic pain of right knee   Relevant Orders   CBC with  Differential/Platelet (Completed)   Comprehensive metabolic panel (Completed)   DG Knee Complete 4 Views Right (Completed)   History of low back pain   Relevant Orders   DG Lumbar Spine Complete (Completed)   Orders Placed This Encounter  Procedures   US Venous Img Lower Unilateral Right (DVT)    Order Specific Question:   Reason for Exam (SYMPTOM  OR DIAGNOSIS REQUIRED)    Answer:   pain at anterior knee and upper inner thigh tender history of DVT and PR rule out DVT.    Order Specific Question:   Preferred imaging location?    Answer:   [facility]    Order Specific Question:   Call Results- Best Contact Number?    Answer:   [phone] hold pt    DG Knee Complete 4 Views Right    Order Specific Question:   Reason for Exam (SYMPTOM  OR DIAGNOSIS REQUIRED)    Answer:   knee pain right anterior.    Order Specific Question:   Preferred imaging location?    Answer:   [facility]   DG Lumbar Spine Complete    Order Specific Question:   Reason for Exam (SYMPTOM  OR DIAGNOSIS REQUIRED)    Answer:   right leg pain rule out    Order Specific Question:   Preferred imaging location?    Answer:   [place]   DG HIPS BILAT WITH PELVIS 3-4 VIEWS    Order Specific Question:   Reason for Exam (SYMPTOM  OR DIAGNOSIS REQUIRED)    Answer:   left hip surgery, on past - right hip and right leg pain    Order Specific Question:   Preferred imaging location?    Answer:   [place]   [facility], MIN 2 VIEWS RIGHT    Order Specific Question:   Reason for Exam (SYMPTOM  OR DIAGNOSIS REQUIRED)    Answer:   pain in inner thigh with palpation    Order Specific Question:   Preferred imaging location?    Answer:   [facility]   CBC with Differential/Platelet   Comprehensive metabolic panel   Ambulatory referral to Dermatology    Referral Priority:   Routine    Referral Type:   Consultation    Referral Reason:   Specialty Services  Required    Referred to Provider:   Ree Edman, MD    Requested Specialty:   Dermatology    Number of Visits Requested:   1    DVT ultrasound right leg.  Dg x rays knee, hip right and lumbar spine, she has had left hip replacement and feel this is likely coming out of her right hip.     No orders of the defined types were placed in this encounter. Red Flags discussed. The patient was given clear instructions to go to ER or return to medical center if any red flags develop, symptoms do not improve, worsen or new problems develop. They verbalized understanding.  Return in about 2 weeks (around 10/08/2021), or if symptoms worsen or fail to improve, for at any time for any worsening symptoms, Go to Emergency room/ urgent care  if worse.   Marcille Buffy, FNP

## 2021-09-24 NOTE — Progress Notes (Signed)
Mile to moderate tricompartmental osteoarthritis. Mild damage to tissue likely for overuse and osteoarthritis. Orthopedics referral again advised.

## 2021-09-25 ENCOUNTER — Encounter: Payer: Self-pay | Admitting: Adult Health

## 2021-09-25 ENCOUNTER — Other Ambulatory Visit: Payer: Self-pay | Admitting: Family Medicine

## 2021-09-25 DIAGNOSIS — R110 Nausea: Secondary | ICD-10-CM

## 2021-09-25 NOTE — Progress Notes (Signed)
Addendum was made by radiologist, results are no DVT in right lower extremity which is the correct extremity test was ordered on.

## 2021-09-26 DIAGNOSIS — L989 Disorder of the skin and subcutaneous tissue, unspecified: Secondary | ICD-10-CM | POA: Insufficient documentation

## 2021-09-26 DIAGNOSIS — M79651 Pain in right thigh: Secondary | ICD-10-CM | POA: Insufficient documentation

## 2021-09-26 DIAGNOSIS — G8929 Other chronic pain: Secondary | ICD-10-CM | POA: Insufficient documentation

## 2021-09-26 DIAGNOSIS — Z8739 Personal history of other diseases of the musculoskeletal system and connective tissue: Secondary | ICD-10-CM | POA: Insufficient documentation

## 2021-10-02 ENCOUNTER — Other Ambulatory Visit: Payer: Self-pay | Admitting: Adult Health

## 2021-10-02 ENCOUNTER — Telehealth: Payer: Self-pay | Admitting: Family Medicine

## 2021-10-02 DIAGNOSIS — G8929 Other chronic pain: Secondary | ICD-10-CM

## 2021-10-02 DIAGNOSIS — M545 Low back pain, unspecified: Secondary | ICD-10-CM

## 2021-10-02 DIAGNOSIS — M25561 Pain in right knee: Secondary | ICD-10-CM

## 2021-10-02 DIAGNOSIS — M79604 Pain in right leg: Secondary | ICD-10-CM

## 2021-10-02 DIAGNOSIS — Z8739 Personal history of other diseases of the musculoskeletal system and connective tissue: Secondary | ICD-10-CM

## 2021-10-02 DIAGNOSIS — M79651 Pain in right thigh: Secondary | ICD-10-CM

## 2021-10-02 NOTE — Telephone Encounter (Signed)
Pt called in wanting to get an xray disk with the lower back comparison on the disk and blood work also

## 2021-10-02 NOTE — Telephone Encounter (Signed)
This did not need to be sent to me. I believe the lab/x-ray staff can make a disc for her with the images.

## 2021-10-02 NOTE — Progress Notes (Signed)
Orders Placed This Encounter  Procedures   Ambulatory referral to Orthopedic Surgery    Referral Priority:   Routine    Referral Type:   Surgical    Referral Reason:   Specialty Services Required    Referred to Provider:   Dereck Leep, MD    Requested Specialty:   Orthopedic Surgery    Number of Visits Requested:   1

## 2021-10-02 NOTE — Telephone Encounter (Signed)
Pt called in wanting to get an xray disk with the lower back comparison on the disk and blood work also.  Fender Herder,cma

## 2021-10-03 NOTE — Telephone Encounter (Signed)
Pt will have to go to [facility] to pick up images on a disc. Please instruct patient to go to the [facility] desk.

## 2021-10-03 NOTE — Telephone Encounter (Signed)
I called the patient and informed her that she needed to go to [facility] to get her xray disc and she understood.  Yuka Lallier,cma

## 2021-10-04 ENCOUNTER — Telehealth: Payer: Self-pay

## 2021-10-04 NOTE — Telephone Encounter (Signed)
Patient is ready to schedule her repeat colonoscopy

## 2021-10-08 ENCOUNTER — Other Ambulatory Visit: Payer: Self-pay

## 2021-10-08 ENCOUNTER — Ambulatory Visit (INDEPENDENT_AMBULATORY_CARE_PROVIDER_SITE_OTHER): Payer: Medicare Other

## 2021-10-08 ENCOUNTER — Ambulatory Visit (INDEPENDENT_AMBULATORY_CARE_PROVIDER_SITE_OTHER): Payer: Medicare Other | Admitting: Adult Health

## 2021-10-08 ENCOUNTER — Encounter: Payer: Self-pay | Admitting: Adult Health

## 2021-10-08 VITALS — BP 120/76 | HR 88 | Temp 98.2°F | Resp 16 | Ht 66.0 in | Wt 211.0 lb

## 2021-10-08 DIAGNOSIS — M1611 Unilateral primary osteoarthritis, right hip: Secondary | ICD-10-CM | POA: Diagnosis not present

## 2021-10-08 DIAGNOSIS — M25571 Pain in right ankle and joints of right foot: Secondary | ICD-10-CM | POA: Diagnosis not present

## 2021-10-08 DIAGNOSIS — M79604 Pain in right leg: Secondary | ICD-10-CM | POA: Diagnosis not present

## 2021-10-08 DIAGNOSIS — M7989 Other specified soft tissue disorders: Secondary | ICD-10-CM | POA: Diagnosis not present

## 2021-10-08 DIAGNOSIS — Z1211 Encounter for screening for malignant neoplasm of colon: Secondary | ICD-10-CM

## 2021-10-08 DIAGNOSIS — R600 Localized edema: Secondary | ICD-10-CM | POA: Diagnosis not present

## 2021-10-08 MED ORDER — PEG 3350-KCL-NA BICARB-NACL 420 G PO SOLR: 4000.0000 mL | Freq: Once | ORAL | 0 refills | Status: AC

## 2021-10-08 NOTE — Progress Notes (Signed)
Gastroenterology Pre-Procedure Review  Request Date: 11/04/2021 Requesting Physician: Dr. Vicente Males   PATIENT REVIEW QUESTIONS: The patient responded to the following health history questions as indicated:    1. Are you having any GI issues? no 2. Do you have a personal history of Polyps? no 3. Do you have a family history of Colon Cancer or Polyps? no 4. Diabetes Mellitus? yes (type 2) 5. Joint replacements in the past 12 months?yes (neck surgery in september) 6. Major health problems in the past 3 months?no 7. Any artificial heart valves, MVP, or defibrillator?no    MEDICATIONS & ALLERGIES:    Patient reports the following regarding taking any anticoagulation/antiplatelet therapy:   Plavix, Coumadin, Eliquis, Xarelto, Lovenox, Pradaxa, Brilinta, or Effient? yes (eliquis) Aspirin? no  Patient confirms/reports the following medications:  Current Outpatient Medications  Medication Sig Dispense Refill   acetaminophen (TYLENOL) 500 MG tablet Take 1 tablet (500 mg total) by mouth every 6 (six) hours as needed. 30 tablet 0   apixaban (ELIQUIS) 5 MG TABS tablet Take 1 tablet (5 mg total) by mouth 2 (two) times daily. 60 tablet 5   azelastine (ASTELIN) 0.1 % nasal spray Place 2 sprays into both nostrils 2 (two) times daily. Use in each nostril as directed 30 mL 1   baclofen (LIORESAL) 10 MG tablet TAKE ONE TABLET BY MOUTH THREE TIMES DAILY AS NEEDED FOR MUSCLE SPASM (VIAL) 60 tablet 1   benzonatate (TESSALON) 100 MG capsule Take 1 capsule (100 mg total) by mouth 3 (three) times daily as needed for cough. 21 capsule 0   blood glucose meter kit and supplies KIT Dispense based on patient and insurance preference. Use once daily fasting. Dx code E11.9. 1 each 0   Blood Glucose Monitoring Suppl (GHT BLOOD GLUCOSE MONITOR) w/Device KIT DISPENSE BASED ON PATIENT AND INSURANCE PREFERENCE. USE ONCE DAILY AS DIRECTED. DX CODE E11.9. 1 kit 0   carvedilol (COREG) 3.125 MG tablet Take 3.125 mg by mouth 2 (two)  times daily.     cetirizine (ZYRTEC) 10 MG tablet TAKE 1 TABLET BY MOUTH EVERY DAY 90 tablet 3   dicyclomine (BENTYL) 20 MG tablet TAKE 1 TABLET BY MOUTH THREE TIMES A DAY AS NEEDED FOR abdominal SPASMS 270 tablet 0   diphenhydrAMINE (BENADRYL) 25 MG tablet Take 1 tablet (25 mg total) by mouth every 6 (six) hours. 12 tablet 0   empagliflozin (JARDIANCE) 25 MG TABS tablet Take 1 tablet (25 mg total) by mouth daily. 90 tablet 1   escitalopram (LEXAPRO) 20 MG tablet TAKE 1 TABLET BY MOUTH EVERY DAY 90 tablet 1   estradiol (ESTRACE) 0.1 MG/GM vaginal cream Dispose applicator, use pea size amount 3 times a week at night 42.5 g 12   famotidine (PEPCID) 20 MG tablet TAKE 1 TABLET BY MOUTH EVERYDAY AT BEDTIME 90 tablet 1   fluticasone (FLONASE) 50 MCG/ACT nasal spray Place 2 sprays into both nostrils daily. 16 g 6   furosemide (LASIX) 20 MG tablet Take 1 tablet (20 mg total) by mouth daily as needed for edema. 90 tablet 1   gabapentin (NEURONTIN) 600 MG tablet TAKE 1 TABLET IN THE AM AND 1.5 TABLETS AT BEDTIME 225 tablet 1   isosorbide mononitrate (IMDUR) 30 MG 24 hr tablet Take 1 tablet (30 mg total) by mouth daily. 90 tablet 2   levothyroxine (SYNTHROID) 25 MCG tablet TAKE 1 TABLET BY MOUTH BEFORE BREAKFAST 2-3 HOURS BEFORE TAKING ANY OTHER MEDICATION OR SUPPLEMENT 90 tablet 3   lidocaine (LIDODERM) 5 %  Place 1 patch onto the skin daily. Remove & Discard patch within 12 hours. 30 patch 0   Multiple Vitamins-Minerals (SUPER THERA VITE M) TABS Take by mouth.     nitroGLYCERIN (NITROSTAT) 0.4 MG SL tablet Place 1 tablet (0.4 mg total) under the tongue every 5 (five) minutes as needed for chest pain. 25 tablet 4   OLANZapine (ZYPREXA) 10 MG tablet TAKE 1 TABLET BY MOUTH EVERYDAY AT BEDTIME (Patient taking differently: Take 15 mg by mouth at bedtime. TAKE 1 TABLET BY MOUTH EVERYDAY AT BEDTIME) 90 tablet 0   ondansetron (ZOFRAN) 4 MG tablet TAKE ONE TABLET BY MOUTH TWICE DAILY AS NEEDED FOR NAUSEA AND VOMITING  (VIAL) 30 tablet 0   pantoprazole (PROTONIX) 40 MG tablet Take 40 mg by mouth every morning.     potassium chloride SA (KLOR-CON M20) 20 MEQ tablet TAKE 1 TABLET (20 MEQ TOTAL) BY MOUTH DAILY AS NEEDED (TAKE WITH LASIX). 90 tablet 1   rosuvastatin (CRESTOR) 20 MG tablet Take 1 tablet (20 mg total) by mouth daily. 90 tablet 1   sitaGLIPtin (JANUVIA) 100 MG tablet Take 1 tablet (100 mg total) by mouth daily. 90 tablet 1   triamcinolone ointment (KENALOG) 0.5 % Apply 1 application topically 2 (two) times daily. 30 g 0   No current facility-administered medications for this visit.    Patient confirms/reports the following allergies:  Allergies  Allergen Reactions   Abilify [Aripiprazole] Other (See Comments)    Jerking movement, slow motor skills   Augmentin [Amoxicillin-Pot Clavulanate] Diarrhea and Nausea And Vomiting   Bactrim [Sulfamethoxazole-Trimethoprim] Diarrhea   Ceftin [Cefuroxime Axetil] Diarrhea   Coconut Oil     Rash    Diclofenac Other (See Comments)     Muscle Pain   Dye Fdc Red [Red Dye]     "hair dye"   Indocin [Indomethacin] Other (See Comments)    Migraines    Linaclotide Diarrhea   Lisinopril Diarrhea and Other (See Comments)    Other reaction(s): Dizziness, Vomiting   Morphine Other (See Comments)    Chest Tightness   Morphine And Related Other (See Comments)    Chest tightness    Simvastatin Other (See Comments)    Muscle Pain   Sulfa Antibiotics Diarrhea   Sulfamethoxazole-Trimethoprim Diarrhea   Wellbutrin [Bupropion] Other (See Comments)    Jerking movement Jerking movement Slurred speech   Zetia [Ezetimibe]     Diarrhea    Quetiapine Fumarate Palpitations   Quetiapine Fumarate Palpitations    No orders of the defined types were placed in this encounter.   AUTHORIZATION INFORMATION Primary Insurance: 1D#: Group #:  Secondary Insurance: 1D#: Group #:  SCHEDULE INFORMATION: Date: 11/04/2021 Time: Location:[facility]

## 2021-10-08 NOTE — Progress Notes (Signed)
Acute Office Visit  Subjective:    Patient ID: Valerie Vazquez, female    DOB: 1956/08/05, 66 y.o.   MRN: [id number]  Chief Complaint  Patient presents with   Follow-up    2 weeks, she is feeling ok still c/o her hip and knee. Discuss imaging results.    HPI Patient is in today for follow up on her right hip and right knee pain. She is now having right ankle pain. She has history of fracture in two places, no surgery healed in cast. Set up to see Dr. Lorie Apley PA on 10/20/21 for orthopedics, she has history of left hip replacement with him years ago.   Right ankle swelling in the last week, denies any injury, no erythema.   DVT ultrasound was negative last visit.   Right femur: Moderate lateral and mild medial compartment of the knee joint space narrowing. No acute fracture is seen within the right femur.   IMPRESSION:: IMPRESSION: 1. Moderate right femoroacetabular osteoarthritis. 2. Status post total left hip arthroplasty without evidence of hardware failure. 3. No acute fracture. Electronically Signed   By: Yvonne Kendall M.D.   On: 09/24/2021 14:44   IMPRESSION: No recent fracture is seen in the lumbar spine. Degenerative changes are noted, more severe in the facet joints in the lower lumbar spine. There is minimal anterolisthesis at L4-L5 level.   Degenerative changes are noted in the right hip. Electronically Signed   By: Elmer Picker M.D.   On: 09/24/2021 15:18   .IMPRESSION::right knee 4 view x ray.  IMPRESSION: 1. Mild-to-moderate tricompartmental osteoarthritis. 2. Mild chronic enthesopathic change at the patellar origin off of the inferior patella Electronically Signed   By: Yvonne Kendall M.D.   On: 09/24/2021 14:40 CLINICAL DATA:  History of left hip arthroplasty. Right hip and right leg pain.   EXAM: RIGHT FEMUR 2 VIEWS; DG HIP (WITH OR WITHOUT PELVIS) 3-4V BILAT   COMPARISON:  Pelvis and right hip radiographs 09/29/2018    FINDINGS: Pelvis and bilateral hips   Postsurgical changes of total left hip arthroplasty. No perihardware lucency is seen to indicate hardware failure or loosening.   Moderate right femoroacetabular joint space narrowing with peripheral acetabular and femoral head-neck junction degenerative osteophytosis. Mild bilateral sacroiliac joint space narrowing and subchondral sclerosis degenerative change. A vascular phlebolith again overlies the left hemipelvis. No acute fracture or dislocation.   Right femur:   Moderate lateral and mild medial compartment of the knee joint space narrowing. No acute fracture is seen within the right femur.    IMPRESSION: 1. Moderate right femoroacetabular osteoarthritis. 2. Status post total left hip arthroplasty without evidence of hardware failure. 3. No acute fracture. Electronically Signed   By: Yvonne Kendall M.D.   On: 09/24/2021 14:44  Patient  denies any fever,chills, rash, chest pain, shortness of breath, nausea, vomiting, or diarrhea.  Denies dizziness, lightheadedness, pre syncopal or syncopal episodes.     Past Medical History:  Diagnosis Date   Acute right-sided low back pain with right-sided sciatica 10/23/2017   Anemia    Bilateral renal cysts    CAD (coronary artery disease) 09/28/2016   Chest pain 10/14/2015   Chronic back pain    "upper and lower" (09/19/2014)   Chronic shoulder pain (Location of Primary Source of Pain) (Left) 01/29/2016   Colitis 04/08/2016   Colon polyp 01/30/2017   Coronary vasospasm ([place]) 12/09/2014   Depression    "major" (09/19/2014)   DVT (deep venous thrombosis) ([place])  Esophageal spasm    Essential hypertension 10/28/2013   Gallstones    Gastritis    Gastroenteritis    GERD (gastroesophageal reflux disease)    Headache    "couple /month lately" (09/19/2014)   Heart disease 12/09/2016   High blood pressure    High cholesterol    History of blood transfusion 1985   related to hysterectomy   History  of nuclear stress test    Myoview 1/19:  EF 65, no ischemia or scar   History of pulmonary embolus (PE) 09/19/2014   Hx of cervical spine surgery 01/29/2016   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 Rule out Paget's disease of the bone with blood work.    Hyperlipidemia 10/28/2013   Hypertension    IBS (irritable bowel syndrome)    Lumbar central spinal stenosis (moderate at L4-5; mild at L2-3 and L3-4) 01/29/2016   Migraine    "a few times/yr" (09/19/2014)   Myocardial infarction ([place]) 10/2010 X 3   "while hospitalized"   Neck pain 01/29/2016   Osteoarthritis    "qwhere; mostly around my joints" (09/19/2014)   Pleural effusion, bilateral 07/31/2015   Pneumonia 04/2014   PTSD (post-traumatic stress disorder)    Pulmonary embolism ([place]) 04/2001; 09/19/2014   "after gallbladder OR; "   Schizoaffective disorder ([place])    Schizoaffective disorder, depressive type ([place]) 08/28/2016   Sleep apnea    "mild" (09/19/2014)   Snoring    a. sleep study 5/16: No OSA   SOB (shortness of breath) 07/31/2015   Spinal stenosis    Spondylosis    Suicide attempt ([place]) 08/27/2016   Type II diabetes mellitus ([place])    "dx'd in 2007; lost weight; no RX for ~ 4 yr now" (09/19/2014)   Wheezing 02/25/2017    Past Surgical History:  Procedure Laterality Date   [place] DECOMP/DISCECTOMY FUSION  10/2012   in [place] C5-C7    [place] Right    BREAST EXCISIONAL BIOPSY Right YRS AGO   NEG   CARDIAC CATHETERIZATION   2000's; 2009   CHOLECYSTECTOMY  2002   COLONOSCOPY WITH PROPOFOL N/A 12/12/2016   Procedure: COLONOSCOPY WITH PROPOFOL;  Surgeon: [name], MD;  Location: [facility] ENDOSCOPY;  Service: Endoscopy;  Laterality: N/A;   COLONOSCOPY WITH PROPOFOL N/A 02/17/2017   Procedure: COLONOSCOPY WITH PROPOFOL;  Surgeon: [name], MD;  Location: [facility] ENDOSCOPY;  Service: Gastroenterology;  Laterality:  N/A;   ESOPHAGOGASTRODUODENOSCOPY (EGD) WITH PROPOFOL N/A 02/17/2017   Procedure: ESOPHAGOGASTRODUODENOSCOPY (EGD) WITH PROPOFOL;  Surgeon: [name], MD;  Location: [facility] ENDOSCOPY;  Service: Gastroenterology;  Laterality: N/A;   ESOPHAGOGASTRODUODENOSCOPY (EGD) WITH PROPOFOL N/A 03/07/2019   Procedure: ESOPHAGOGASTRODUODENOSCOPY (EGD) WITH PROPOFOL;  Surgeon: [name], MD;  Location: [facility] ENDOSCOPY;  Service: Gastroenterology;  Laterality: N/A;   EXCISION/RELEASE BURSA HIP Right 12/1984   HERNIA REPAIR  1985   LEFT HEART CATHETERIZATION WITH CORONARY ANGIOGRAM N/A 12/09/2014   Procedure: LEFT HEART CATHETERIZATION WITH CORONARY ANGIOGRAM;  Surgeon: Burnell Blanks, MD;  Location: [facility] CATH LAB;  Service: Cardiovascular;  Laterality: N/A;   TONSILLECTOMY AND ADENOIDECTOMY  ~ 1968   TOTAL HIP ARTHROPLASTY Left [id number]   UMBILICAL HERNIA REPAIR  1985   VENA CAVA FILTER PLACEMENT  03/2014    Family History  Problem Relation Age of Onset   Stroke Mother        Deceased   Lung  cancer Father        Deceased   Healthy Daughter    Healthy Son        x 2   Other Son        Suicide   Cancer Brother    Heart attack Neg Hx     Social History   Socioeconomic History   Marital status: Divorced    Spouse name: Not on file   Number of children: Not on file   Years of education: Not on file   Highest education level: Not on file  Occupational History   Not on file  Tobacco Use   Smoking status: Former    Packs/day: 1.50    Years: 10.00    Pack years: 15.00    Types: Cigarettes    Quit date: 04/12/1979    Years since quitting: 42.5   Smokeless tobacco: Never  Vaping Use   Vaping Use: Never used  Substance and Sexual Activity   Alcohol use: No    Alcohol/week: 0.0 standard drinks   Drug use: No   Sexual activity: Not Currently  Other Topics Concern   Not on file  Social History Narrative   Lives with fiancee in an apartment on the first floor.  Has 3 grown children.  One  child passed away.  On disability 11-16-1994 - 2000, 2007 - current.     Education: some college.   Social Determinants of Health   Financial Resource Strain: Low Risk    Difficulty of Paying Living Expenses: Not hard at all  Food Insecurity: No Food Insecurity   Worried About Charity fundraiser in the Last Year: Never true   [place] in the Last Year: Never true  Transportation Needs: No Transportation Needs   Lack of Transportation (Medical): No   Lack of Transportation (Non-Medical): No  Physical Activity: Unknown   Days of Exercise per Week: 0 days   Minutes of Exercise per Session: Not on file  Stress: No Stress Concern Present   Feeling of Stress : Not at all  Social Connections: Unknown   Frequency of Communication with Friends and Family: More than three times a week   Frequency of Social Gatherings with Friends and Family: More than three times a week   Attends Religious Services: Not on Electrical engineer or Organizations: Not on file   Attends Archivist Meetings: Not on file   Marital Status: Not on file  Intimate Partner Violence: Not At Risk   Fear of Current or Ex-Partner: No   Emotionally Abused: No   Physically Abused: No   Sexually Abused: No    Outpatient Medications Prior to Visit  Medication Sig Dispense Refill   acetaminophen (TYLENOL) 500 MG tablet Take 1 tablet (500 mg total) by mouth every 6 (six) hours as needed. 30 tablet 0   apixaban (ELIQUIS) 5 MG TABS tablet Take 1 tablet (5 mg total) by mouth 2 (two) times daily. 60 tablet 5   azelastine (ASTELIN) 0.1 % nasal spray Place 2 sprays into both nostrils 2 (two) times daily. Use in each nostril as directed 30 mL 1   baclofen (LIORESAL) 10 MG tablet TAKE ONE TABLET BY MOUTH THREE TIMES DAILY AS NEEDED FOR MUSCLE SPASM (VIAL) 60 tablet 1   benzonatate (TESSALON) 100 MG capsule Take 1 capsule (100 mg total) by mouth 3 (three) times daily as needed for cough. 21 capsule 0   blood  glucose  meter kit and supplies KIT Dispense based on patient and insurance preference. Use once daily fasting. Dx code E11.9. 1 each 0   Blood Glucose Monitoring Suppl (GHT BLOOD GLUCOSE MONITOR) w/Device KIT DISPENSE BASED ON PATIENT AND INSURANCE PREFERENCE. USE ONCE DAILY AS DIRECTED. DX CODE E11.9. 1 kit 0   carvedilol (COREG) 3.125 MG tablet Take 3.125 mg by mouth 2 (two) times daily.     cetirizine (ZYRTEC) 10 MG tablet TAKE 1 TABLET BY MOUTH EVERY DAY 90 tablet 3   dicyclomine (BENTYL) 20 MG tablet TAKE 1 TABLET BY MOUTH THREE TIMES A DAY AS NEEDED FOR abdominal SPASMS 270 tablet 0   diphenhydrAMINE (BENADRYL) 25 MG tablet Take 1 tablet (25 mg total) by mouth every 6 (six) hours. 12 tablet 0   empagliflozin (JARDIANCE) 25 MG TABS tablet Take 1 tablet (25 mg total) by mouth daily. 90 tablet 1   escitalopram (LEXAPRO) 20 MG tablet TAKE 1 TABLET BY MOUTH EVERY DAY 90 tablet 1   estradiol (ESTRACE) 0.1 MG/GM vaginal cream Dispose applicator, use pea size amount 3 times a week at night 42.5 g 12   famotidine (PEPCID) 20 MG tablet TAKE 1 TABLET BY MOUTH EVERYDAY AT BEDTIME 90 tablet 1   fluticasone (FLONASE) 50 MCG/ACT nasal spray Place 2 sprays into both nostrils daily. 16 g 6   furosemide (LASIX) 20 MG tablet Take 1 tablet (20 mg total) by mouth daily as needed for edema. 90 tablet 1   gabapentin (NEURONTIN) 600 MG tablet TAKE 1 TABLET IN THE AM AND 1.5 TABLETS AT BEDTIME 225 tablet 1   isosorbide mononitrate (IMDUR) 30 MG 24 hr tablet Take 1 tablet (30 mg total) by mouth daily. 90 tablet 2   levothyroxine (SYNTHROID) 25 MCG tablet TAKE 1 TABLET BY MOUTH BEFORE BREAKFAST 2-3 HOURS BEFORE TAKING ANY OTHER MEDICATION OR SUPPLEMENT 90 tablet 3   lidocaine (LIDODERM) 5 % Place 1 patch onto the skin daily. Remove & Discard patch within 12 hours. 30 patch 0   Multiple Vitamins-Minerals (SUPER THERA VITE M) TABS Take by mouth.     nitroGLYCERIN (NITROSTAT) 0.4 MG SL tablet Place 1 tablet (0.4 mg total)  under the tongue every 5 (five) minutes as needed for chest pain. 25 tablet 4   OLANZapine (ZYPREXA) 10 MG tablet TAKE 1 TABLET BY MOUTH EVERYDAY AT BEDTIME (Patient taking differently: Take 15 mg by mouth at bedtime. TAKE 1 TABLET BY MOUTH EVERYDAY AT BEDTIME) 90 tablet 0   ondansetron (ZOFRAN) 4 MG tablet TAKE ONE TABLET BY MOUTH TWICE DAILY AS NEEDED FOR NAUSEA AND VOMITING (VIAL) 30 tablet 0   pantoprazole (PROTONIX) 40 MG tablet Take 40 mg by mouth every morning.     potassium chloride SA (KLOR-CON M20) 20 MEQ tablet TAKE 1 TABLET (20 MEQ TOTAL) BY MOUTH DAILY AS NEEDED (TAKE WITH LASIX). 90 tablet 1   rosuvastatin (CRESTOR) 20 MG tablet Take 1 tablet (20 mg total) by mouth daily. 90 tablet 1   sitaGLIPtin (JANUVIA) 100 MG tablet Take 1 tablet (100 mg total) by mouth daily. 90 tablet 1   triamcinolone ointment (KENALOG) 0.5 % Apply 1 application topically 2 (two) times daily. 30 g 0   No facility-administered medications prior to visit.    Allergies  Allergen Reactions   Abilify [Aripiprazole] Other (See Comments)    Jerking movement, slow motor skills   Augmentin [Amoxicillin-Pot Clavulanate] Diarrhea and Nausea And Vomiting   Bactrim [Sulfamethoxazole-Trimethoprim] Diarrhea   Ceftin [Cefuroxime Axetil] Diarrhea  Coconut Oil     Rash    Diclofenac Other (See Comments)     Muscle Pain   Dye Fdc Red [Red Dye]     "hair dye"   Indocin [Indomethacin] Other (See Comments)    Migraines    Linaclotide Diarrhea   Lisinopril Diarrhea and Other (See Comments)    Other reaction(s): Dizziness, Vomiting   Morphine Other (See Comments)    Chest Tightness   Morphine And Related Other (See Comments)    Chest tightness    Simvastatin Other (See Comments)    Muscle Pain   Sulfa Antibiotics Diarrhea   Sulfamethoxazole-Trimethoprim Diarrhea   Wellbutrin [Bupropion] Other (See Comments)    Jerking movement Jerking movement Slurred speech   Zetia [Ezetimibe]     Diarrhea    Quetiapine  Fumarate Palpitations   Quetiapine Fumarate Palpitations    Review of Systems  Constitutional: Negative.   HENT: Negative.    Eyes: Negative.   Respiratory: Negative.    Cardiovascular: Negative.   Gastrointestinal: Negative.        Scheduled for colonoscopy in march just scheduled today.   Genitourinary: Negative.   Musculoskeletal:  Positive for arthralgias, gait problem and myalgias. Negative for back pain, joint swelling, neck pain and neck stiffness.  Skin: Negative.   Neurological:  Negative for dizziness.  Psychiatric/Behavioral: Negative.        Objective:    Physical Exam Constitutional:      General: She is not in acute distress.    Appearance: She is not ill-appearing, toxic-appearing or diaphoretic.  HENT:     Head: Normocephalic and atraumatic.  Abdominal:     General: There is no distension.     Palpations: Abdomen is soft.     Tenderness: There is no abdominal tenderness.  Musculoskeletal:     Thoracic back: Normal.     Lumbar back: Tenderness present. No spasms. Decreased range of motion. Negative right straight leg raise test and negative left straight leg raise test.       Back:     Right hip: Tenderness and bony tenderness present. Decreased range of motion.     Right upper leg: Normal.     Right knee: Bony tenderness present. Decreased range of motion. Tenderness present over the ACL. No ACL laxity.     Right lower leg: 1+ Edema present.     Left lower leg: 1+ Edema present.     Right ankle: Swelling (mild to moderate lateral malleolus. tender to palpation, no warmth.) present. No ecchymosis. Tenderness present over the lateral malleolus.     Comments: Right hip, right knee and new  right ankle pain now.   Skin:    Findings: Rash (small hyperpigmented area on right knee has follow up with dermatology.) present. No erythema.  Neurological:     Mental Status: She is oriented to person, place, and time.  Psychiatric:        Mood and Affect: Mood  normal.        Behavior: Behavior normal.        Thought Content: Thought content normal.        Judgment: Judgment normal.    General: Appearance:    Mildly obese female in no acute distress  Eyes:    PERRL, conjunctiva/corneas clear, EOM's intact       Lungs:     Clear to auscultation bilaterally, respirations unlabored  Heart:    Normal heart rate. Normal rhythm. No murmurs, rubs, or gallops.  MS:   All extremities are intact.    Neurologic:   Awake, alert, oriented x 3. No apparent focal neurological           defect.     BP 120/76 (BP Location: Left Arm, Patient Position: Sitting, Cuff Size: Large)    Pulse 88    Temp 98.2 F (36.8 C) (Oral)    Resp 16    Ht $R'5\' 6"'yA$  (1.676 m)    Wt 211 lb (95.7 kg)    SpO2 96%    BMI 34.06 kg/m  Wt Readings from Last 3 Encounters:  10/08/21 211 lb (95.7 kg)  09/24/21 211 lb (95.7 kg)  08/27/21 210 lb 3.2 oz (95.3 kg)    Health Maintenance Due  Topic Date Due   OPHTHALMOLOGY EXAM  10/27/2020    There are no preventive care reminders to display for this patient.   Lab Results  Component Value Date   TSH 2.47 08/27/2021   Lab Results  Component Value Date   WBC 7.3 09/24/2021   HGB 13.1 09/24/2021   HCT 39.5 09/24/2021   MCV 96.7 09/24/2021   PLT 301.0 09/24/2021   Lab Results  Component Value Date   NA 140 09/24/2021   K 3.9 09/24/2021   CO2 26 09/24/2021   GLUCOSE 108 (H) 09/24/2021   BUN 7 09/24/2021   CREATININE 0.89 09/24/2021   BILITOT 0.4 09/24/2021   ALKPHOS 124 (H) 09/24/2021   AST 20 09/24/2021   ALT 22 09/24/2021   PROT 6.8 09/24/2021   ALBUMIN 4.1 09/24/2021   CALCIUM 9.3 09/24/2021   ANIONGAP 9 09/06/2020   GFR 67.06 09/24/2021   Lab Results  Component Value Date   CHOL 169 03/12/2020   Lab Results  Component Value Date   HDL 40.00 03/12/2020   Lab Results  Component Value Date   LDLCALC 110 (H) 03/12/2020   Lab Results  Component Value Date   TRIG 96.0 03/12/2020   Lab Results  Component  Value Date   CHOLHDL 4 03/12/2020   Lab Results  Component Value Date   HGBA1C 6.6 (H) 08/27/2021       Assessment & Plan:   Problem List Items Addressed This Visit       Musculoskeletal and Integument   Primary osteoarthritis of right hip   Other Visit Diagnoses     Pain in right leg    -  Primary   Relevant Orders   [place] Lower Ext Art Bilat   Acute right ankle pain       Relevant Orders   DG Ankle Complete Right (Completed)      Keep follow up with orthopedics. Dermatology referral has been placed already.  Red Flags discussed. The patient was given clear instructions to go to ER or return to medical center if any red flags develop, symptoms do not improve, worsen or new problems develop. They verbalized understanding.  Return in about 2 months (around 12/06/2021), or if symptoms worsen or fail to improve, for at any time for any worsening symptoms, Go to Emergency room/ urgent care if worse.  Red Flags discussed. The patient was given clear instructions to go to ER or return to medical center if any red flags develop, symptoms do not improve, worsen or new problems develop. They verbalized understanding.;    Marcille Buffy, FNP

## 2021-10-08 NOTE — Progress Notes (Signed)
Right ankle soft tissue edema on ultrasound, degenerative changes. No acute abnormality notes. Apply a compressive ACE bandage. Rest and elevate the affected painful area.  Apply cold compresses intermittently as needed.  As pain recedes, begin normal activities slowly as tolerated.  Call if symptoms persist.   Keep appointment with orthopedics as referred.

## 2021-10-08 NOTE — Patient Instructions (Addendum)
[facility] Call to verify the appointment for dermatology.  Phone: [phone] Ankle Pain The ankle joint holds your body weight and allows you to move around. Ankle pain can occur on either side or the back of one ankle or both ankles. Ankle pain may be sharp and burning or dull and aching. There may be tenderness, stiffness, redness, or warmth around the ankle. Many things can cause ankle pain, including an injury to the area and overuse of the ankle. Follow these instructions at home: Activity Rest your ankle as told by your health care provider. Avoid any activities that cause ankle pain. Do not use the injured limb to support your body weight until your health care provider says that you can. Use crutches as told by your health care provider. Do exercises as told by your health care provider. Ask your health care provider when it is safe to drive if you have a brace on your ankle. If you have a brace: Wear the brace as told by your health care provider. Remove it only as told by your health care provider. Loosen the brace if your toes tingle, become numb, or turn cold and blue. Keep the brace clean. If the brace is not waterproof: Do not let it get wet. Cover it with a watertight covering when you take a bath or shower. If you were given an elastic bandage:  Remove it when you take a bath or a shower. Try not to move your ankle very much, but wiggle your toes from time to time. This helps to prevent swelling. Adjust the bandage to make it more comfortable if it feels too tight. Loosen the bandage if you have numbness or tingling in your foot or if your foot turns cold and blue. Managing pain, stiffness, and swelling  If directed, put ice on the painful area. If you have a removable brace or elastic bandage, remove it as told by your health care provider. Put ice in a plastic bag. Place a towel between your skin and the bag. Leave the ice on for 20 minutes, 2-3 times a  day. Move your toes often to avoid stiffness and to lessen swelling. Raise (elevate) your ankle above the level of your heart while you are sitting or lying down. General instructions Record information about your pain. Writing down the following may be helpful for you and your health care provider: How often you have ankle pain. Where the pain is located. What the pain feels like. If treatment involves wearing a prescribed shoe or insole, make sure you wear it correctly and for as long as told by your health care provider. Take over-the-counter and prescription medicines only as told by your health care provider. Keep all follow-up visits as told by your health care provider. This is important. Contact a health care provider if: Your pain gets worse. Your pain is not relieved with medicines. You have a fever or chills. You are having more trouble with walking. You have new symptoms. Get help right away if: Your foot, leg, toes, or ankle: Tingles or becomes numb. Becomes swollen. Turns pale or blue. Summary Ankle pain can occur on either side or the back of one ankle or both ankles. Ankle pain may be sharp and burning or dull and aching. Rest your ankle as told by your health care provider. If told, apply ice to the area. Take over-the-counter and prescription medicines only as told by your health care provider. This information is not intended to replace  advice given to you by your health care provider. Make sure you discuss any questions you have with your health care provider. Document Revised: 10/11/2020 Document Reviewed: 10/11/2020 Elsevier Patient Education  2022 [place].   Hip Pain The hip is the joint between the upper legs and the lower pelvis. The bones, cartilage, tendons, and muscles of your hip joint support your body and allow you to move around. Hip pain can range from a minor ache to severe pain in one or both of your hips. The pain may be felt on the inside  of the hip joint near the groin, or on the outside near the buttocks and upper thigh. You may also have swelling or stiffness in your hip area. Follow these instructions at home: Managing pain, stiffness, and swelling   If directed, put ice on the painful area. To do this: Put ice in a plastic bag. Place a towel between your skin and the bag. Leave the ice on for 20 minutes, 2-3 times a day. If directed, apply heat to the affected area as often as told by your health care provider. Use the heat source that your health care provider recommends, such as a moist heat pack or a heating pad. Place a towel between your skin and the heat source. Leave the heat on for 20-30 minutes. Remove the heat if your skin turns bright red. This is especially important if you are unable to feel pain, heat, or cold. You may have a greater risk of getting burned. Activity Do exercises as told by your health care provider. Avoid activities that cause pain. General instructions  Take over-the-counter and prescription medicines only as told by your health care provider. Keep a journal of your symptoms. Write down: How often you have hip pain. The location of your pain. What the pain feels like. What makes the pain worse. Sleep with a pillow between your legs on your most comfortable side. Keep all follow-up visits as told by your health care provider. This is important. Contact a health care provider if: You cannot put weight on your leg. Your pain or swelling continues or gets worse after one week. It gets harder to walk. You have a fever. Get help right away if: You fall. You have a sudden increase in pain and swelling in your hip. Your hip is red or swollen or very tender to touch. Summary Hip pain can range from a minor ache to severe pain in one or both of your hips. The pain may be felt on the inside of the hip joint near the groin, or on the outside near the buttocks and upper thigh. Avoid  activities that cause pain. Write down how often you have hip pain, the location of the pain, what makes it worse, and what it feels like. This information is not intended to replace advice given to you by your health care provider. Make sure you discuss any questions you have with your health care provider. Document Revised: 01/03/2019 Document Reviewed: 01/03/2019 Elsevier Patient Education  [place].

## 2021-10-11 ENCOUNTER — Encounter: Payer: Self-pay | Admitting: Adult Health

## 2021-10-13 ENCOUNTER — Other Ambulatory Visit: Payer: Self-pay | Admitting: Family Medicine

## 2021-10-13 DIAGNOSIS — R21 Rash and other nonspecific skin eruption: Secondary | ICD-10-CM

## 2021-10-16 ENCOUNTER — Other Ambulatory Visit: Payer: Self-pay | Admitting: Family Medicine

## 2021-10-16 IMAGING — MR MR CERVICAL SPINE W/O CM
5 series · 35 of 48 positions shown · non-contrast
Comparison: Prior radiograph from 04/08/2018.

CLINICAL DATA: Initial evaluation for chronic neck pain with
bilateral arm pain, limited range of motion. Prior surgery.

EXAM:
MRI CERVICAL SPINE WITHOUT CONTRAST
TECHNIQUE: Multiplanar, multisequence MR imaging of the cervical spine was
performed. No intravenous contrast was administered.

[Series 5: T2 · sagittal · 3.0mm · 0.62mm/px · 6 of 15 slices shown (1 of 2)]
[im 1/15]
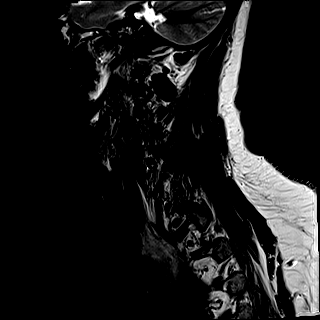
[im 3/15]
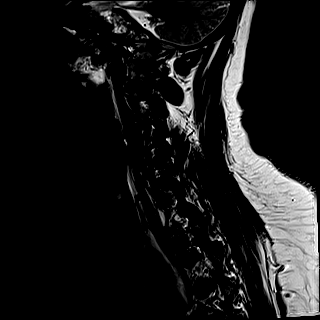
[im 6/15]
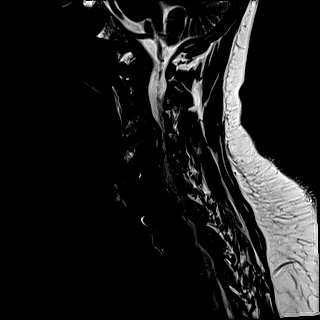
[im 9/15]
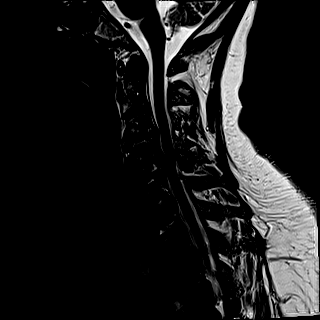
[im 12/15]
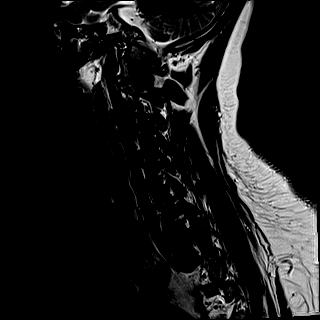
[im 15/15]
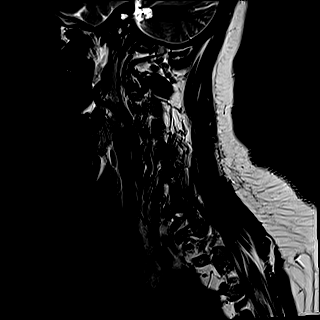

[Series 6: FLAIR · sagittal · 3.0mm · 0.78mm/px · 7 of 15 slices shown]
[im 1/15]
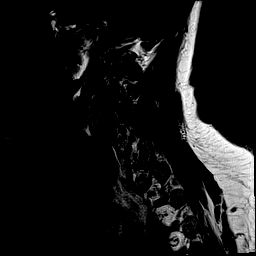
[im 3/15]
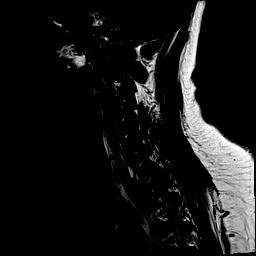
[im 5/15]
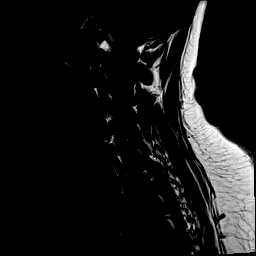
[im 8/15]
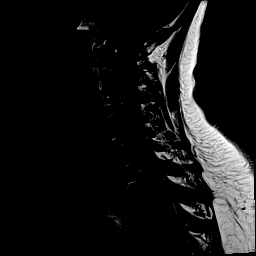
[im 10/15]
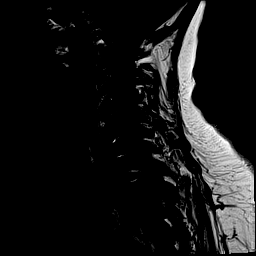
[im 12/15]
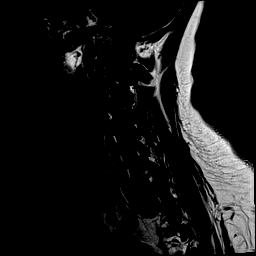
[im 15/15]
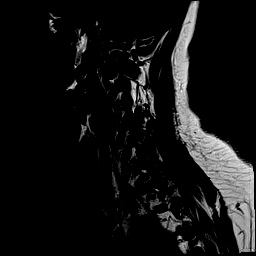

[Series 7: STIR · sagittal · 3.0mm · 0.62mm/px · 7 of 15 slices shown]
[im 1/15]
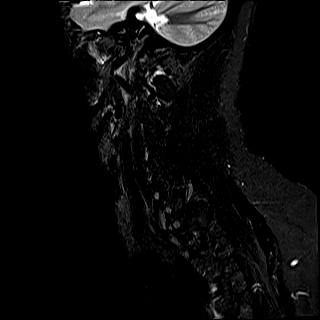
[im 3/15]
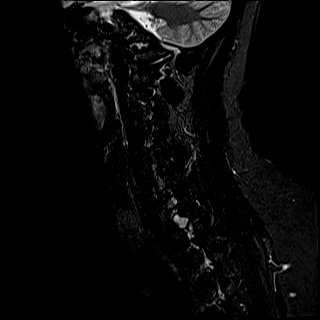
[im 5/15]
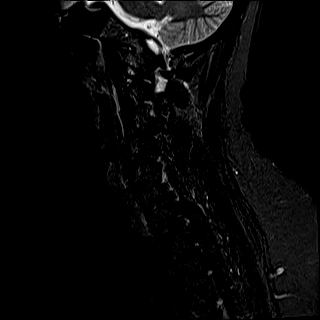
[im 8/15]
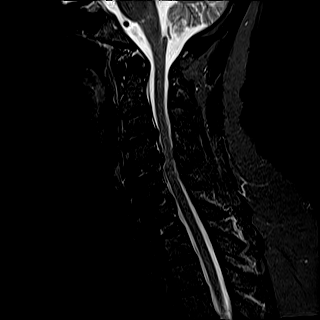
[im 10/15]
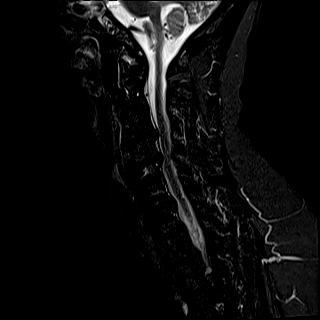
[im 12/15]
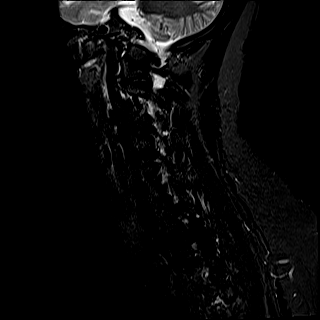
[im 15/15]
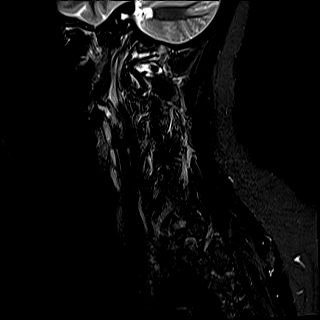

[Series 8: T2 · axial · 3.0mm · 0.70mm/px · z∈[-135,-40]mm · 8 of 29 slices shown (2 of 2)]
[im 1/29]
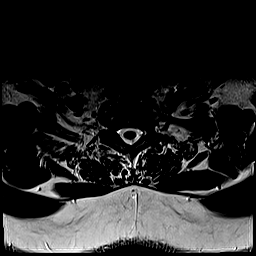
[im 5/29]
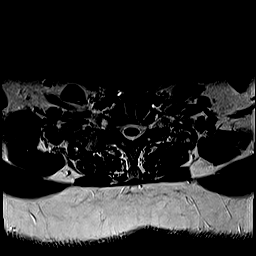
[im 9/29]
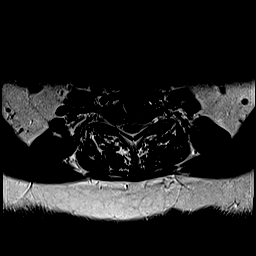
[im 13/29]
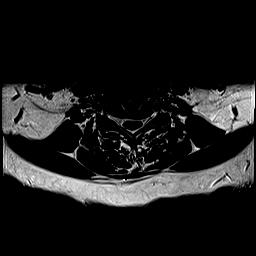
[im 16/29]
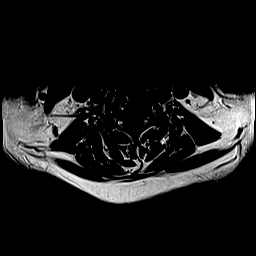
[im 20/29]
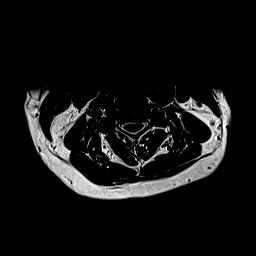
[im 24/29]
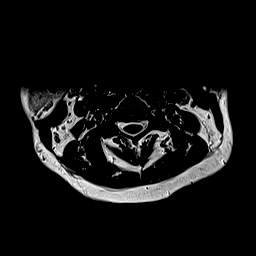
[im 29/29]
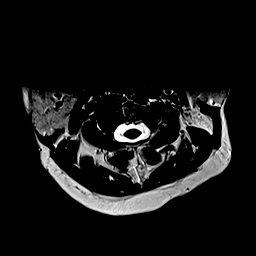

[Series 9: ax mpgr · axial · 3.0mm · 0.35mm/px · z∈[-135,-57]mm · 7 of 29 slices shown]
[im 1/29]
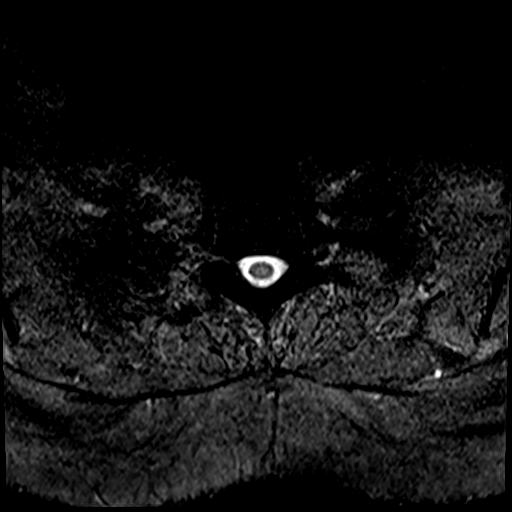
[im 5/29]
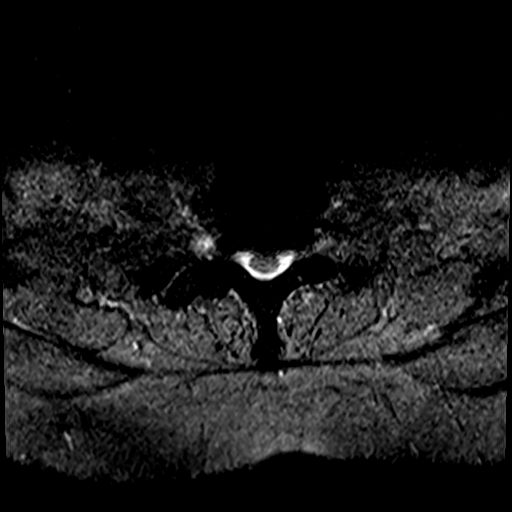
[im 9/29]
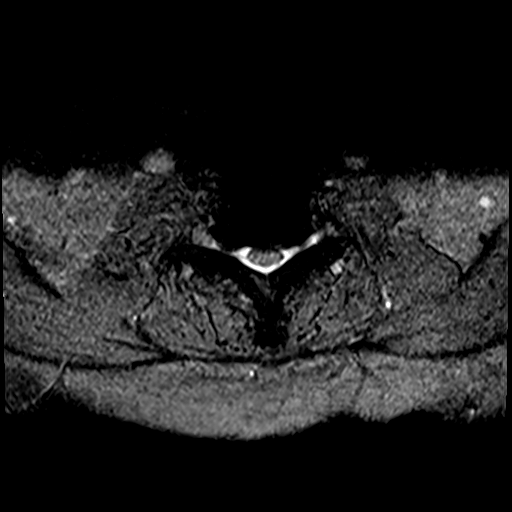
[im 13/29]
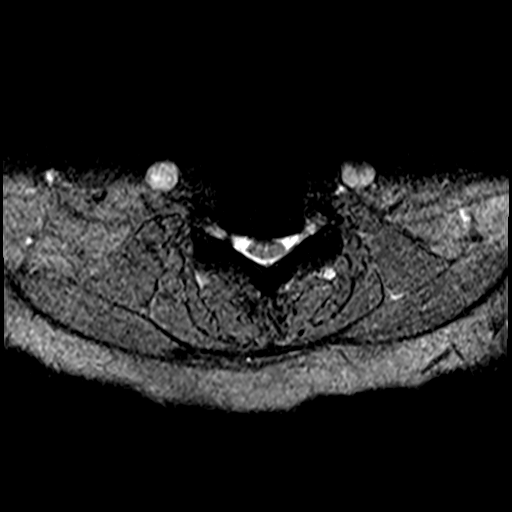
[im 16/29]
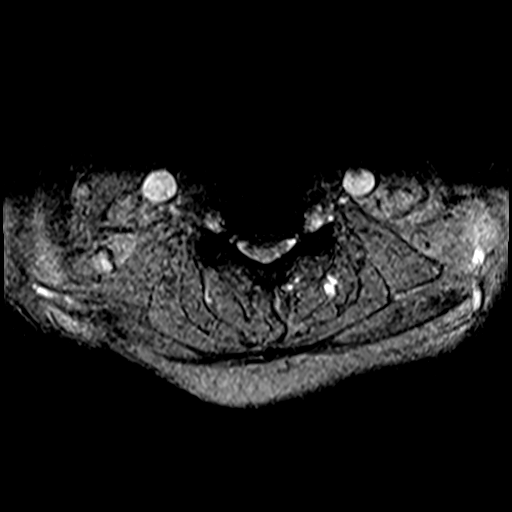
[im 20/29]
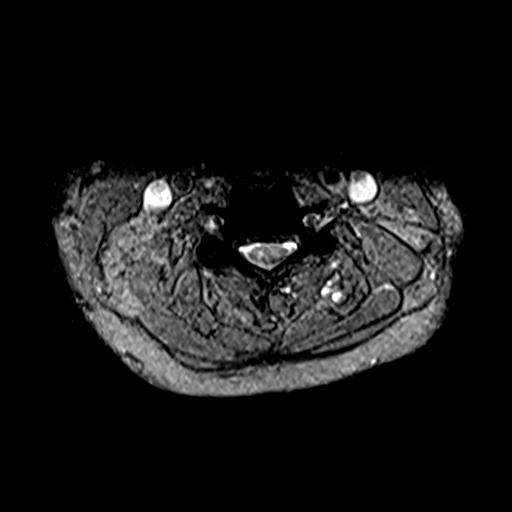
[im 24/29]
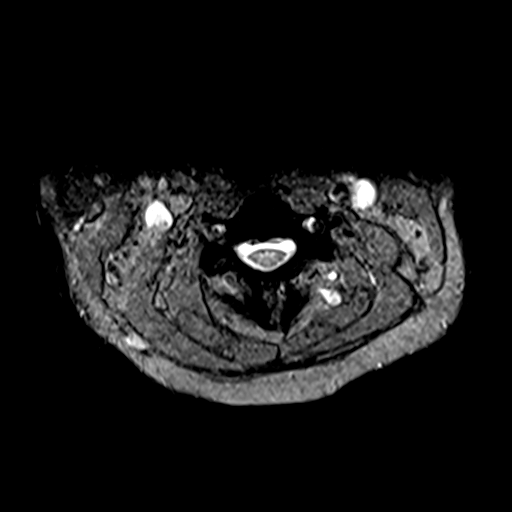

[35 of 48 positions shown; findings below may reference images not displayed]

FINDINGS: Alignment: Straightening of the normal cervical lordosis. Trace 2-3
mm retrolisthesis of C4 on C5, with trace anterolisthesis of C7 on
T1.

Vertebrae: Prior ACDF at C5-C7 with solid arthrodesis. Vertebral
body height maintained without evidence for acute or chronic
fracture. Bone marrow signal intensity normal. No discrete or
worrisome osseous lesions. Discogenic reactive endplate changes
present about the C3-4 and C4-5 interspaces. No other abnormal
marrow edema.

Cord: Signal intensity within the cervical spinal cord is normal.

Posterior Fossa, vertebral arteries, paraspinal tissues: Visualized
brain and posterior fossa within normal limits. Craniocervical
junction normal. Paraspinous and prevertebral soft tissues within
normal limits. Normal intravascular flow voids seen within the
vertebral arteries bilaterally.

Disc levels:

C2-C3: Mild disc bulge with bilateral uncovertebral hypertrophy. No
significant spinal stenosis. Mild to moderate right C3 foraminal
narrowing. No significant left foraminal stenosis.

C3-C4: Chronic diffuse degenerative disc osteophyte with
intervertebral disc space narrowing. Broad posterior component
flattens and effaces the ventral thecal sac resultant
mild-to-moderate spinal stenosis. Secondary cord flattening without
cord signal changes. Moderate to severe right worse than left C4
foraminal stenosis.

C4-C5: Chronic diffuse degenerative disc osteophyte with
intervertebral disc space narrowing. Flattening and effacement of
the ventral thecal sac with resultant moderate spinal stenosis. Cord
flattening without cord signal changes. Severe bilateral C5
foraminal stenosis.

C5-C6: Prior fusion. No residual spinal stenosis. Foramina appear
patent.

C6-C7: Prior fusion. No residual spinal stenosis. Residual right
greater than left uncovertebral hypertrophy with resultant mild left
and moderate right C7 foraminal stenosis.

C7-T1: Trace anterolisthesis. Mild disc bulge with superimposed
small right foraminal disc protrusion (series 8, image 26).
Bilateral facet hypertrophy. No significant spinal stenosis.
Moderate right with mild left C8 foraminal stenosis.

Delete that
IMPRESSION: 1. Prior ACDF at C5-C7 without residual spinal stenosis. Residual
uncovertebral disease at C6-7 with resultant mild to moderate
bilateral C7 foraminal stenosis.
2. Degenerative disc osteophyte complexes at C3-4 and C4-5 with
resultant mild to moderate spinal stenosis, with moderate to severe
bilateral C4 and C5 foraminal narrowing, most severe at C4-5.
3. Disc bulge with facet hypertrophy at C7-T1 with resultant
moderate right and mild left C8 foraminal stenosis.

## 2021-10-21 ENCOUNTER — Encounter: Payer: Self-pay | Admitting: Family Medicine

## 2021-10-21 ENCOUNTER — Telehealth: Payer: Self-pay

## 2021-10-21 NOTE — Telephone Encounter (Signed)
Resent blood thinner request

## 2021-10-23 NOTE — Telephone Encounter (Signed)
Hope called checking on the request for blood thinner.

## 2021-10-24 ENCOUNTER — Other Ambulatory Visit: Payer: Self-pay | Admitting: Family Medicine

## 2021-10-24 ENCOUNTER — Telehealth: Payer: Self-pay

## 2021-10-24 DIAGNOSIS — R110 Nausea: Secondary | ICD-10-CM

## 2021-10-24 DIAGNOSIS — L818 Other specified disorders of pigmentation: Secondary | ICD-10-CM | POA: Diagnosis not present

## 2021-10-24 NOTE — Telephone Encounter (Signed)
Noted. The patient will need to stop the eliquis 2 days prior to the procedure which is on 11/04/21. Her last dose of eliquis will be the evening of 11/01/21. She will take lovenox injections the morning and evening of 3/4 and the morning of 3/5. She can resume the eliquis the day after the procedure if GI feels that is ok. I will complete the form tomorrow when I am back in the office. Please let GI and the patient know the above information. Please find out what pharmacy the patient wants this sent to. Thanks.

## 2021-10-24 NOTE — Addendum Note (Signed)
Addended by: [name] on: 10/24/2021 10:04 AM   Modules accepted: Orders

## 2021-10-24 NOTE — Telephone Encounter (Signed)
Gae Bon called from [place] office she is calling patient with instructions about her blood thinner message in chart

## 2021-10-25 ENCOUNTER — Other Ambulatory Visit: Payer: Self-pay

## 2021-10-25 ENCOUNTER — Ambulatory Visit
Admission: RE | Admit: 2021-10-25 | Discharge: 2021-10-25 | Disposition: A | Discharge status: Home / Self Care | Payer: Medicare Other | Source: Ambulatory Visit | Attending: Family Medicine | Admitting: Family Medicine

## 2021-10-25 DIAGNOSIS — Z1231 Encounter for screening mammogram for malignant neoplasm of breast: Secondary | ICD-10-CM | POA: Insufficient documentation

## 2021-10-25 DIAGNOSIS — M1711 Unilateral primary osteoarthritis, right knee: Secondary | ICD-10-CM | POA: Diagnosis not present

## 2021-10-25 DIAGNOSIS — M1611 Unilateral primary osteoarthritis, right hip: Secondary | ICD-10-CM | POA: Diagnosis not present

## 2021-10-25 NOTE — Telephone Encounter (Signed)
Pharmacy requesting scripts to have on file for patient 6 each okay to fill. Follow up scheduled 11/27/21

## 2021-10-31 ENCOUNTER — Telehealth: Payer: Self-pay | Admitting: Family Medicine

## 2021-10-31 ENCOUNTER — Telehealth: Payer: Self-pay

## 2021-10-31 MED ORDER — ENOXAPARIN SODIUM 100 MG/ML IJ SOSY: 1.0000 mg/kg | PREFILLED_SYRINGE | Freq: Two times a day (BID) | INTRAMUSCULAR | 0 refills | Status: DC

## 2021-10-31 NOTE — Telephone Encounter (Signed)
Please see my prior message. It looks like you may not have reached the patient previously with these instructions. I sent the lovenox to [facility] in [place].  ?

## 2021-10-31 NOTE — Telephone Encounter (Signed)
Pt want to be called concerning her levonox bridge ?

## 2021-10-31 NOTE — Telephone Encounter (Signed)
I called nad spoke with the patient and gave her the instructions on how to bridge form Eliquis and Lovonox, I also sent her a mychart message  about the bridge just to make sure she does it correctly and I informed her of this.  Zeffie Bickert,cma  ?

## 2021-10-31 NOTE — Telephone Encounter (Signed)
Called patient per sonnenberg they have let patient know about her levonox bridge so I called patient back she understands  so all set on procedure date ?

## 2021-10-31 NOTE — Addendum Note (Signed)
Addended by: [name] on: 10/31/2021 10:49 AM ? ? Modules accepted: Orders ? ?

## 2021-10-31 NOTE — Telephone Encounter (Addendum)
CALLED PATIENT TO LET HER KNOW I RECEIVED HER BLOOD THINNER BACK FOR THE ELIQUIS  PER SONNENBERG SHE CAN STOP HER ELIQUIS FOR 2 DAYS PRIOR AND RESTART 1 DAY AFTER WAITING ON THE LOVENOX CLEARANCE  CALLED AND CONFIRMED FAX WAS RECEIVED TO OTHER OFFICE ?

## 2021-11-04 ENCOUNTER — Encounter
Admission: RE | Disposition: A | Discharge status: Home / Self Care | Payer: Self-pay | Source: Home / Self Care | Attending: Gastroenterology

## 2021-11-04 ENCOUNTER — Ambulatory Visit: Payer: Medicare Other | Admitting: Certified Registered"

## 2021-11-04 ENCOUNTER — Ambulatory Visit
Admission: RE | Admit: 2021-11-04 | Discharge: 2021-11-04 | Disposition: A | Discharge status: Home / Self Care | Payer: Medicare Other | Attending: Gastroenterology | Admitting: Gastroenterology

## 2021-11-04 DIAGNOSIS — I2510 Atherosclerotic heart disease of native coronary artery without angina pectoris: Secondary | ICD-10-CM | POA: Diagnosis not present

## 2021-11-04 DIAGNOSIS — Z79899 Other long term (current) drug therapy: Secondary | ICD-10-CM | POA: Insufficient documentation

## 2021-11-04 DIAGNOSIS — I252 Old myocardial infarction: Secondary | ICD-10-CM | POA: Diagnosis not present

## 2021-11-04 DIAGNOSIS — D125 Benign neoplasm of sigmoid colon: Secondary | ICD-10-CM | POA: Insufficient documentation

## 2021-11-04 DIAGNOSIS — G4730 Sleep apnea, unspecified: Secondary | ICD-10-CM | POA: Diagnosis not present

## 2021-11-04 DIAGNOSIS — Z86010 Personal history of colonic polyps: Secondary | ICD-10-CM | POA: Insufficient documentation

## 2021-11-04 DIAGNOSIS — I10 Essential (primary) hypertension: Secondary | ICD-10-CM | POA: Diagnosis not present

## 2021-11-04 DIAGNOSIS — F251 Schizoaffective disorder, depressive type: Secondary | ICD-10-CM | POA: Diagnosis not present

## 2021-11-04 DIAGNOSIS — E119 Type 2 diabetes mellitus without complications: Secondary | ICD-10-CM | POA: Insufficient documentation

## 2021-11-04 DIAGNOSIS — K219 Gastro-esophageal reflux disease without esophagitis: Secondary | ICD-10-CM | POA: Insufficient documentation

## 2021-11-04 DIAGNOSIS — Z87891 Personal history of nicotine dependence: Secondary | ICD-10-CM | POA: Insufficient documentation

## 2021-11-04 DIAGNOSIS — D126 Benign neoplasm of colon, unspecified: Secondary | ICD-10-CM | POA: Diagnosis not present

## 2021-11-04 DIAGNOSIS — Z86711 Personal history of pulmonary embolism: Secondary | ICD-10-CM | POA: Diagnosis not present

## 2021-11-04 DIAGNOSIS — E039 Hypothyroidism, unspecified: Secondary | ICD-10-CM | POA: Diagnosis not present

## 2021-11-04 DIAGNOSIS — F32A Depression, unspecified: Secondary | ICD-10-CM | POA: Diagnosis not present

## 2021-11-04 DIAGNOSIS — Z1211 Encounter for screening for malignant neoplasm of colon: Secondary | ICD-10-CM | POA: Diagnosis not present

## 2021-11-04 DIAGNOSIS — D120 Benign neoplasm of cecum: Secondary | ICD-10-CM | POA: Insufficient documentation

## 2021-11-04 DIAGNOSIS — K635 Polyp of colon: Secondary | ICD-10-CM

## 2021-11-04 HISTORY — PX: COLONOSCOPY WITH PROPOFOL: SHX5780

## 2021-11-04 LAB — GLUCOSE, CAPILLARY: Glucose-Capillary: 120 mg/dL — ABNORMAL HIGH (ref 70–99)

## 2021-11-04 SURGERY — COLONOSCOPY WITH PROPOFOL
Anesthesia: General

## 2021-11-04 MED ORDER — SODIUM CHLORIDE 0.9 % IV SOLN
INTRAVENOUS | Status: DC
  Administered 2021-11-04: 20 mL/h via INTRAVENOUS

## 2021-11-04 MED ORDER — PROPOFOL 500 MG/50ML IV EMUL
INTRAVENOUS | Status: DC | PRN
  Administered 2021-11-04: 120 ug/kg/min via INTRAVENOUS

## 2021-11-04 MED ORDER — GLYCOPYRROLATE 0.2 MG/ML IJ SOLN
INTRAMUSCULAR | Status: DC | PRN
  Administered 2021-11-04: .2 mg via INTRAVENOUS

## 2021-11-04 MED ORDER — PROPOFOL 10 MG/ML IV BOLUS
INTRAVENOUS | Status: DC | PRN
  Administered 2021-11-04: 70 mg via INTRAVENOUS
  Administered 2021-11-04: 30 mg via INTRAVENOUS

## 2021-11-04 MED ORDER — LIDOCAINE 2% (20 MG/ML) 5 ML SYRINGE
INTRAMUSCULAR | Status: DC | PRN
  Administered 2021-11-04: 20 mg via INTRAVENOUS

## 2021-11-04 MED ORDER — SODIUM CHLORIDE (PF) 0.9 % IJ SOLN
INTRAMUSCULAR | Status: DC | PRN
Start: 2021-11-04 — End: 2021-11-04
  Administered 2021-11-04: 3 mL via INTRAVENOUS

## 2021-11-04 NOTE — Op Note (Signed)
[facility] ?Gastroenterology ?Patient Name: Valerie Vazquez ?Procedure Date: 11/04/2021 8:58 AM ?MRN: [id number] ?Account #: [id number] ?Date of Birth: July 02, 1956 ?Admit Type: Outpatient ?Age: 66 ?Room: [facility] ENDO ROOM 4 ?Gender: Female ?Note Status: Finalized ?Instrument Name: Colonoscope [id number] ?Procedure:             Colonoscopy ?Indications:           Surveillance: Personal history of colonic polyps  ?                       (unknown histology) on last colonoscopy 5 years ago ?Providers:             [name] MD, MD ?Referring MD:          Angela Adam. Caryl Bis (Referring MD) ?Medicines:             Monitored Anesthesia Care ?Complications:         No immediate complications. ?Procedure:             Pre-Anesthesia Assessment: ?                       - Prior to the procedure, a History and Physical was  ?                       performed, and patient medications, allergies and  ?                       sensitivities were reviewed. The patient's tolerance  ?                       of previous anesthesia was reviewed. ?                       - The risks and benefits of the procedure and the  ?                       sedation options and risks were discussed with the  ?                       patient. All questions were answered and informed  ?                       consent was obtained. ?                       - ASA Grade Assessment: II - A patient with mild  ?                       systemic disease. ?                       After obtaining informed consent, the colonoscope was  ?                       passed under direct vision. Throughout the procedure,  ?                       the patient's blood pressure, pulse, and oxygen  ?  saturations were monitored continuously. The  ?                       Colonoscope was introduced through the anus and  ?                       advanced to the the cecum, identified by the  ?                       appendiceal orifice. The colonoscopy was  performed  ?                       with ease. The patient tolerated the procedure well.  ?                       The quality of the bowel preparation was excellent. ?Findings: ?     The perianal and digital rectal examinations were normal. ?     A 10 mm polyp was found in the cecum. The polyp was sessile. The polyp  ?     was removed with a saline injection-lift technique using a hot snare.  ?     Resection and retrieval were complete. To prevent bleeding after the  ?     polypectomy, one hemostatic clip was successfully placed. There was no  ?     bleeding at the end of the procedure. ?     A 7 mm polyp was found in the sigmoid colon. The polyp was sessile. The  ?     polyp was removed with a cold snare. Resection and retrieval were  ?     complete. ?     The exam was otherwise without abnormality on direct and retroflexion  ?     views. ?Impression:            - One 10 mm polyp in the cecum, removed using  ?                       injection-lift and a hot snare. Resected and  ?                       retrieved. Clip was placed. ?                       - One 7 mm polyp in the sigmoid colon, removed with a  ?                       cold snare. Resected and retrieved. ?                       - The examination was otherwise normal on direct and  ?                       retroflexion views. ?Recommendation:        - Discharge patient to home (with escort). ?                       - Resume previous diet. ?                       - Continue present medications. ?                       -  Await pathology results. ?                       - Repeat colonoscopy in 3 years for surveillance after  ?                       piecemeal polypectomy. ?Procedure Code(s):     --- Professional --- ?                       [phone], Colonoscopy, flexible; with removal of  ?                       tumor(s), polyp(s), or other lesion(s) by snare  ?                       technique ?                       [id number], Colonoscopy, flexible; with directed  submucosal  ?                       injection(s), any substance ?Diagnosis Code(s):     --- Professional --- ?                       Z86.010, Personal history of colonic polyps ?                       K63.5, Polyp of colon ?CPT copyright 2019 American Medical Association. All rights reserved. ?The codes documented in this report are preliminary and upon coder review may  ?be revised to meet current compliance requirements. ?[name], MD ?[name] MD, MD ?11/04/2021 9:34:03 AM ?This report has been signed electronically. ?Number of Addenda: 0 ?Note Initiated On: 11/04/2021 8:58 AM ?Scope Withdrawal Time: 0 hours 17 minutes 24 seconds  ?Total Procedure Duration: 0 hours 23 minutes 22 seconds  ?Estimated Blood Loss:  Estimated blood loss: none. ?     [facility] ?

## 2021-11-04 NOTE — Anesthesia Postprocedure Evaluation (Signed)
Anesthesia Post Note ? ?Patient: Valerie Vazquez ? ?Procedure(s) Performed: COLONOSCOPY WITH PROPOFOL ? ?Patient location during evaluation: PACU ?Anesthesia Type: General ?Level of consciousness: awake and alert ?Pain management: pain level controlled ?Vital Signs Assessment: post-procedure vital signs reviewed and stable ?Respiratory status: spontaneous breathing, nonlabored ventilation, respiratory function stable and patient connected to nasal cannula oxygen ?Cardiovascular status: blood pressure returned to baseline and stable ?Postop Assessment: no apparent nausea or vomiting ?Anesthetic complications: no ? ? ?No notable events documented. ? ? ?Last Vitals:  ?Vitals:  ? 11/04/21 0937 11/04/21 0951  ?BP:  (!) 148/76  ?Pulse:    ?Resp:    ?Temp: (!) 35.6 ?C   ?SpO2:    ?  ?Last Pain:  ?Vitals:  ? 11/04/21 0951  ?TempSrc:   ?PainSc: 0-No pain  ? ? ?  ?  ?  ?  ?  ?  ? ?Molli Barrows ? ? ? ? ?

## 2021-11-04 NOTE — Transfer of Care (Signed)
Immediate Anesthesia Transfer of Care Note ? ?Patient: Valerie Vazquez ? ?Procedure(s) Performed: COLONOSCOPY WITH PROPOFOL ? ?Patient Location: Endoscopy Unit ? ?Anesthesia Type:General ? ?Level of Consciousness: awake ? ?Airway & Oxygen Therapy: Patient Spontanous Breathing ? ?Post-op Assessment: Report given to RN and Post -op Vital signs reviewed and stable ? ?Post vital signs: Reviewed ? ?Last Vitals:  ?Vitals Value Taken Time  ?BP 107/67 11/04/21 0935  ?Temp    ?Pulse 70 11/04/21 0937  ?Resp 22 11/04/21 0937  ?SpO2 98 % 11/04/21 0937  ?Vitals shown include unvalidated device data. ? ?Last Pain:  ?Vitals:  ? 11/04/21 0935  ?TempSrc:   ?PainSc: 0-No pain  ?   ? ?  ? ?Complications: No notable events documented. ?

## 2021-11-04 NOTE — H&P (Signed)
[name], MD [address] [place], [place], [place], [address] [address], [place], [place], [place], [address] Phone: [phone]  Fax: [phone]  Primary Care Physician:  [name], MD   Pre-Procedure History & Physical: HPI:  Valerie Vazquez is a 66 y.o. female is here for an colonoscopy.   Past Medical History:  Diagnosis Date   Acute right-sided low back pain with right-sided sciatica 10/23/2017   Anemia    Bilateral renal cysts    CAD (coronary artery disease) 09/28/2016   Chest pain 10/14/2015   Chronic back pain    "upper and lower" (09/19/2014)   Chronic shoulder pain (Location of Primary Source of Pain) (Left) 01/29/2016   Colitis 04/08/2016   Colon polyp 01/30/2017   Coronary vasospasm ([place]) 12/09/2014   Depression    "major" (09/19/2014)   DVT (deep venous thrombosis) (HCC)    Esophageal spasm    Essential hypertension 10/28/2013   Gallstones    Gastritis    Gastroenteritis    GERD (gastroesophageal reflux disease)    Headache    "couple /month lately" (09/19/2014)   Heart disease 12/09/2016   High blood pressure    High cholesterol    History of blood transfusion 1985   related to hysterectomy   History of nuclear stress test    Myoview 1/19:  EF 65, no ischemia or scar   History of pulmonary embolus (PE) 09/19/2014   Hx of cervical spine surgery 01/29/2016   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 Rule out Paget's disease of the bone with blood work.    Hyperlipidemia 10/28/2013   Hypertension    IBS (irritable bowel syndrome)    Lumbar central spinal stenosis (moderate at L4-5; mild at L2-3 and L3-4) 01/29/2016   Migraine    "a few times/yr" (09/19/2014)   Myocardial infarction ([place]) 10/2010 X 3   "while hospitalized"   Neck pain 01/29/2016   Osteoarthritis    "qwhere; mostly around my joints" (09/19/2014)   Pleural effusion, bilateral 07/31/2015   Pneumonia 04/2014   PTSD (post-traumatic stress disorder)    Pulmonary  embolism ([place]) 04/2001; 09/19/2014   "after gallbladder OR; "   Schizoaffective disorder ([place])    Schizoaffective disorder, depressive type ([place]) 08/28/2016   Sleep apnea    "mild" (09/19/2014)   Snoring    a. sleep study 5/16: No OSA   SOB (shortness of breath) 07/31/2015   Spinal stenosis    Spondylosis    Suicide attempt ([place]) 08/27/2016   Type II diabetes mellitus ([place])    "dx'd in 2007; lost weight; no RX for ~ 4 yr now" (09/19/2014)   Wheezing 02/25/2017    Past Surgical History:  Procedure Laterality Date   [place] DECOMP/DISCECTOMY FUSION  10/2012   in [place] C5-C7    [place] Right    BREAST EXCISIONAL BIOPSY Right YRS AGO   NEG   CARDIAC CATHETERIZATION   2000's; 2009   CHOLECYSTECTOMY  2002   COLONOSCOPY WITH PROPOFOL N/A 12/12/2016   Procedure: COLONOSCOPY WITH PROPOFOL;  Surgeon: [name], MD;  Location: [facility] ENDOSCOPY;  Service: Endoscopy;  Laterality: N/A;   COLONOSCOPY WITH PROPOFOL N/A 02/17/2017   Procedure: COLONOSCOPY WITH PROPOFOL;  Surgeon: [name], MD;  Location: [facility] ENDOSCOPY;  Service: Gastroenterology;  Laterality: N/A;   ESOPHAGOGASTRODUODENOSCOPY (EGD) WITH PROPOFOL N/A 02/17/2017   Procedure: ESOPHAGOGASTRODUODENOSCOPY (  EGD) WITH PROPOFOL;  Surgeon: [name], MD;  [facility] ENDOSCOPY;  Service: Gastroenterology;  Laterality: N/A;   ESOPHAGOGASTRODUODENOSCOPY (EGD) WITH PROPOFOL N/A 03/07/2019   Procedure: ESOPHAGOGASTRODUODENOSCOPY (EGD) WITH PROPOFOL;  Surgeon: [name], MD;  Location: [facility] ENDOSCOPY;  Service: Gastroenterology;  Laterality: N/A;   EXCISION/RELEASE BURSA HIP Right 12/1984   HERNIA REPAIR  1985   LEFT HEART CATHETERIZATION WITH CORONARY ANGIOGRAM N/A 12/09/2014   Procedure: LEFT HEART CATHETERIZATION WITH CORONARY ANGIOGRAM;  Surgeon: Burnell Blanks, MD;  Location: [facility] CATH LAB;  Service: Cardiovascular;  Laterality: N/A;    TONSILLECTOMY AND ADENOIDECTOMY  ~ [facility] ARTHROPLASTY Left [phone]   UMBILICAL HERNIA REPAIR  1985   VENA CAVA FILTER PLACEMENT  03/2014    Prior to Admission medications   Medication Sig Start Date End Date Taking? Authorizing Provider  acetaminophen (TYLENOL) 500 MG tablet Take 1 tablet (500 mg total) by mouth every 6 (six) hours as needed. 09/29/18  Yes Laban Emperor, PA-C  apixaban (ELIQUIS) 5 MG TABS tablet Take 1 tablet (5 mg total) by mouth 2 (two) times daily. 06/20/21  Yes [name], MD  azelastine (ASTELIN) 0.1 % nasal spray Place 2 sprays into both nostrils 2 (two) times daily. Use in each nostril as directed 12/07/20  Yes [name], MD  baclofen (LIORESAL) 10 MG tablet TAKE ONE TABLET BY MOUTH THREE TIMES DAILY AS NEEDED FOR MUSCLE SPASMS (VIAL) 10/25/21  Yes [name], MD  benzonatate (TESSALON) 100 MG capsule Take 1 capsule (100 mg total) by mouth 3 (three) times daily as needed for cough. 08/02/21  Yes Sharion Balloon, NP  blood glucose meter kit and supplies KIT Dispense based on patient and insurance preference. Use once daily fasting. Dx code E11.9. 08/27/21  Yes [name], MD  Blood Glucose Monitoring Suppl (GHT BLOOD GLUCOSE MONITOR) w/Device KIT DISPENSE BASED ON PATIENT AND INSURANCE PREFERENCE. USE ONCE DAILY AS DIRECTED. DX CODE E11.9. 01/14/21  Yes [name], MD  carvedilol (COREG) 3.125 MG tablet Take 3.125 mg by mouth 2 (two) times daily. 06/27/21  Yes [provider]  cetirizine (ZYRTEC) 10 MG tablet TAKE 1 TABLET BY MOUTH EVERY DAY 03/28/21  Yes [name], MD  dicyclomine (BENTYL) 20 MG tablet TAKE ONE TABLET BY MOUTH THREE TIMES DAILY AS NEEDED FOR ABDOMINAL SPASMS (VIAL) 10/16/21  Yes [name], MD  diphenhydrAMINE (BENADRYL) 25 MG tablet Take 1 tablet (25 mg total) by mouth every 6 (six) hours. 09/12/16  Yes Jola Schmidt, MD  empagliflozin (JARDIANCE) 25 MG TABS tablet Take 1 tablet (25 mg  total) by mouth daily. 06/20/21  Yes [name], MD  enoxaparin (LOVENOX) 100 MG/ML injection Inject 0.95 mLs (95 mg total) into the skin every 12 (twelve) hours. Take a dose the morning and evening of 11/02/21 and the morning of 11/03/21. 10/31/21  Yes [name], MD  escitalopram (LEXAPRO) 20 MG tablet TAKE 1 TABLET BY MOUTH EVERY DAY 08/28/21  Yes [name], MD  estradiol (ESTRACE) 0.1 MG/GM vaginal cream Dispose applicator, use pea size amount 3 times a week at night 09/24/20  Yes MacDiarmid, Nicki Reaper, MD  famotidine (PEPCID) 20 MG tablet TAKE 1 TABLET BY MOUTH EVERYDAY AT BEDTIME 06/20/21  Yes [name], MD  fluticasone (FLONASE) 50 MCG/ACT nasal spray Place 2 sprays into both nostrils daily. 12/07/20  Yes [name], MD  furosemide (LASIX) 20 MG tablet Take 1 tablet (20 mg  total) by mouth daily as needed for edema. 07/18/21  Yes [name], MD  gabapentin (NEURONTIN) 600 MG tablet TAKE 1 TABLET IN THE AM AND 1.5 TABLETS AT BEDTIME 06/20/21  Yes [name], MD  isosorbide mononitrate (IMDUR) 30 MG 24 hr tablet Take 1 tablet (30 mg total) by mouth daily. 07/18/21  Yes [name], MD  levothyroxine (SYNTHROID) 25 MCG tablet TAKE 1 TABLET BY MOUTH BEFORE BREAKFAST 2-3 HOURS BEFORE TAKING ANY OTHER MEDICATION OR SUPPLEMENT 06/20/21  Yes [name], MD  lidocaine (LIDODERM) 5 % Place 1 patch onto the skin daily. Remove & Discard patch within 12 hours. 10/06/18  Yes [name], MD  Multiple Vitamins-Minerals (SUPER THERA VITE M) TABS Take by mouth.   Yes [provider]  nitroGLYCERIN (NITROSTAT) 0.4 MG SL tablet Place 1 tablet (0.4 mg total) under the tongue every 5 (five) minutes as needed for chest pain. 01/14/21  Yes [name], MD  OLANZapine (ZYPREXA) 10 MG tablet TAKE 1 TABLET BY MOUTH EVERYDAY AT BEDTIME Patient taking differently: Take 15 mg by mouth at bedtime. TAKE 1 TABLET BY MOUTH EVERYDAY AT BEDTIME  06/20/21  Yes [name], MD  ondansetron (ZOFRAN) 4 MG tablet TAKE ONE TABLET BY MOUTH TWICE DAILY AS NEEDED FOR NAUSEA AND VOMITING (VIAL) 10/25/21  Yes [name], MD  pantoprazole (PROTONIX) 40 MG tablet TAKE ONE TABLET BY MOUTH DAILY AT 9AM EVERY DAY 10/16/21  Yes [name], MD  potassium chloride SA (KLOR-CON M20) 20 MEQ tablet TAKE 1 TABLET (20 MEQ TOTAL) BY MOUTH DAILY AS NEEDED (TAKE WITH LASIX). 06/20/21  Yes [name], MD  rosuvastatin (CRESTOR) 20 MG tablet Take 1 tablet (20 mg total) by mouth daily. 06/20/21  Yes [name], MD  triamcinolone ointment (KENALOG) 0.5 % APPLY TO AFFECTED AREA TWICE A DAY 10/13/21  Yes Dutch Quint B, FNP  sitaGLIPtin (JANUVIA) 100 MG tablet Take 1 tablet (100 mg total) by mouth daily. 06/20/21   [name], MD    Allergies as of 10/08/2021 - Review Complete 10/08/2021  Allergen Reaction Noted   Abilify [aripiprazole] Other (See Comments) 09/20/2013   Augmentin [amoxicillin-pot clavulanate] Diarrhea and Nausea And Vomiting 09/20/2013   Bactrim [sulfamethoxazole-trimethoprim] Diarrhea 09/20/2013   Ceftin [cefuroxime axetil] Diarrhea 09/20/2013   Coconut oil  10/19/2015   Diclofenac Other (See Comments) 08/01/2014   Dye fdc red [red dye]  01/22/2016   Indocin [indomethacin] Other (See Comments) 09/20/2013   Linaclotide Diarrhea 12/09/2014   Lisinopril Diarrhea and Other (See Comments) 09/20/2013   Morphine Other (See Comments) 12/09/2014   Morphine and related Other (See Comments) 09/20/2013   Simvastatin Other (See Comments) 08/01/2014   Sulfa antibiotics Diarrhea 12/09/2014   Sulfamethoxazole-trimethoprim Diarrhea 12/09/2014   Wellbutrin [bupropion] Other (See Comments) 09/20/2013   Zetia [ezetimibe]  09/18/2017   Quetiapine fumarate Palpitations 08/01/2014   Quetiapine fumarate Palpitations 08/16/2014    Family History  Problem Relation Age of Onset   Stroke Mother        Deceased   Lung  cancer Father        Deceased   Healthy Daughter    Healthy Son        x 2   Other Son        Suicide   Cancer Brother    Heart attack Neg Hx     Social History   Socioeconomic History   Marital status: Divorced    Spouse  name: Not on file   Number of children: Not on file   Years of education: Not on file   Highest education level: Not on file  Occupational History   Not on file  Tobacco Use   Smoking status: Former    Packs/day: 1.50    Years: 10.00    Pack years: 15.00    Types: Cigarettes    Quit date: 04/12/1979    Years since quitting: 42.5   Smokeless tobacco: Never  Vaping Use   Vaping Use: Never used  Substance and Sexual Activity   Alcohol use: No    Alcohol/week: 0.0 standard drinks   Drug use: No   Sexual activity: Not Currently  Other Topics Concern   Not on file  Social History Narrative   Lives with fiancee in an apartment on the first floor.  Has 3 grown children.  One child passed away.  On disability 1994-10-22 - 2000, 2007 - current.     Education: some college.   Social Determinants of Health   Financial Resource Strain: Low Risk    Difficulty of Paying Living Expenses: Not hard at all  Food Insecurity: No Food Insecurity   Worried About Charity fundraiser in the Last Year: Never true   [place] in the Last Year: Never true  Transportation Needs: No Transportation Needs   Lack of Transportation (Medical): No   Lack of Transportation (Non-Medical): No  Physical Activity: Unknown   Days of Exercise per Week: 0 days   Minutes of Exercise per Session: Not on file  Stress: No Stress Concern Present   Feeling of Stress : Not at all  Social Connections: Unknown   Frequency of Communication with Friends and Family: More than three times a week   Frequency of Social Gatherings with Friends and Family: More than three times a week   Attends Religious Services: Not on Electrical engineer or Organizations: Not on file   Attends Theatre manager Meetings: Not on file   Marital Status: Not on file  Intimate Partner Violence: Not At Risk   Fear of Current or Ex-Partner: No   Emotionally Abused: No   Physically Abused: No   Sexually Abused: No    Review of Systems: See HPI, otherwise negative ROS  Physical Exam: BP (!) 185/102    Pulse 63    Temp (!) 96.7 F (35.9 C) (Temporal)    Resp 20    Ht _0  (1.676 m)    Wt 95.7 kg    SpO2 100%    BMI 34.06 kg/m  General:   Alert,  pleasant and cooperative in NAD Head:  Normocephalic and atraumatic. Neck:  Supple; no masses or thyromegaly. Lungs:  Clear throughout to auscultation, normal respiratory effort.    Heart:  +S1, +S2, Regular rate and rhythm, No edema. Abdomen:  Soft, nontender and nondistended. Normal bowel sounds, without guarding, and without rebound.   Neurologic:  Alert and  oriented x4;  grossly normal neurologically.  Impression/Plan: KIERSTON PLASENCIA is here for an colonoscopy to be performed for surveillance due to prior history of colon polyps   Risks, benefits, limitations, and alternatives regarding  colonoscopy have been reviewed with the patient.  Questions have been answered.  All parties agreeable.   [name], MD  11/04/2021, 8:55 AM

## 2021-11-04 NOTE — Anesthesia Preprocedure Evaluation (Signed)
Anesthesia Evaluation  ?Patient identified by MRN, date of birth, ID band ?Patient awake ? ? ? ?Reviewed: ?Allergy & Precautions, H&P , NPO status , Patient's Chart, lab work & pertinent test results, reviewed documented beta blocker date and time  ? ?Airway ?Mallampati: II ? ? ?Neck ROM: full ? ? ? Dental ? ?(+) Poor Dentition ?  ?Pulmonary ?sleep apnea , pneumonia, resolved, former smoker,  ?  ?Pulmonary exam normal ? ? ? ? ? ? ? Cardiovascular ?Exercise Tolerance: Good ?hypertension, On Medications ?+ CAD and + Past MI  ?Normal cardiovascular exam ?Rhythm:regular Rate:Normal ? ? ?  ?Neuro/Psych ? Headaches, PSYCHIATRIC DISORDERS Anxiety Depression Schizophrenia  Neuromuscular disease   ? GI/Hepatic ?Neg liver ROS, GERD  Medicated,  ?Endo/Other  ?diabetesHypothyroidism  ? Renal/GU ?Renal disease  ?negative genitourinary ?  ?Musculoskeletal ? ? Abdominal ?  ?Peds ? Hematology ? ?(+) Blood dyscrasia, anemia ,   ?Anesthesia Other Findings ?Past Medical History: ?10/23/2017: Acute right-sided low back pain with right-sided sciatica ?No date: Anemia ?No date: Bilateral renal cysts ?09/28/2016: CAD (coronary artery disease) ?10/14/2015: Chest pain ?No date: Chronic back pain ?    Comment:  "upper and lower" (09/19/2014) ?01/29/2016: Chronic shoulder pain (Location of Primary Source of Pain)  ?(Left) ?04/08/2016: Colitis ?01/30/2017: Colon polyp ?12/09/2014: Coronary vasospasm ([place]) ?No date: Depression ?    Comment:  "major" (09/19/2014) ?No date: DVT (deep venous thrombosis) ([place]) ?No date: Esophageal spasm ?10/28/2013: Essential hypertension ?No date: Gallstones ?No date: Gastritis ?No date: Gastroenteritis ?No date: GERD (gastroesophageal reflux disease) ?No date: Headache ?    Comment:  "couple /month lately" (09/19/2014) ?12/09/2016: Heart disease ?No date: High blood pressure ?No date: High cholesterol ?1985: History of blood transfusion ?    Comment:  related to hysterectomy ?No date: History  of nuclear stress test ?    Comment:  Myoview 1/19:  EF 65, no ischemia or scar ?09/19/2014: History of pulmonary embolus (PE) ?01/29/2016: Hx of cervical spine surgery ?02/13/2015: Hypercementosis ?    Comment:  Per patient diagnosed in [place]. Rule out Paget's  ?             disease of the bone with blood work.  ?10/28/2013: Hyperlipidemia ?No date: Hypertension ?No date: IBS (irritable bowel syndrome) ?01/29/2016: Lumbar central spinal stenosis (moderate at L4-5; mild at  ?L2-3 and L3-4) ?No date: Migraine ?    Comment:  "a few times/yr" (09/19/2014) ?10/2010 X 3: Myocardial infarction ([place]) ?    Comment:  "while hospitalized" ?01/29/2016: Neck pain ?No date: Osteoarthritis ?    Comment:  "qwhere; mostly around my joints" (09/19/2014) ?07/31/2015: Pleural effusion, bilateral ?04/2014: Pneumonia ?No date: PTSD (post-traumatic stress disorder) ?04/2001; 09/19/2014: Pulmonary embolism ([place]) ?    Comment:  "after gallbladder OR; " ?No date: Schizoaffective disorder ([place]) ?08/28/2016: Schizoaffective disorder, depressive type ([place]) ?No date: Sleep apnea ?    Comment:  "mild" (09/19/2014) ?No date: Snoring ?    Comment:  a. sleep study 5/16: No OSA ?07/31/2015: SOB (shortness of breath) ?No date: Spinal stenosis ?No date: Spondylosis ?08/27/2016: Suicide attempt ([place]) ?No date: Type II diabetes mellitus ([place]) ?    Comment:  "dx'd in 2007; lost weight; no RX for ~ 4 yr now"  ?             (09/19/2014) ?02/25/2017: Wheezing ?Past Surgical History: ?1985: ABDOMINAL HYSTERECTOMY ?10/2012: ANTERIOR CERVICAL DECOMP/DISCECTOMY FUSION ?    Comment:  in [place] C5-C7  ?1985: APPENDECTOMY ?No date: BACK SURGERY ?No date: BREAST CYST  EXCISION; Right ?YRS AGO: BREAST EXCISIONAL BIOPSY; Right ?    Comment:  NEG ? 2000's; 2009: CARDIAC CATHETERIZATION ?2002: CHOLECYSTECTOMY ?12/12/2016: COLONOSCOPY WITH PROPOFOL; N/A ?    Comment:  Procedure: COLONOSCOPY WITH PROPOFOL;  Surgeon: Bailey Mech  ?             Vicente Males, MD;  Location: [facility] ENDOSCOPY;  Service:  Endoscopy; ?             Laterality: N/A; ?02/17/2017: COLONOSCOPY WITH PROPOFOL; N/A ?    Comment:  Procedure: COLONOSCOPY WITH PROPOFOL;  Surgeon: Vicente Males,  ?             Bailey Mech, MD;  Location: [facility];  Service:  ?             Gastroenterology;  Laterality: N/A; ?02/17/2017: ESOPHAGOGASTRODUODENOSCOPY (EGD) WITH PROPOFOL; N/A ?    Comment:  Procedure: ESOPHAGOGASTRODUODENOSCOPY (EGD) WITH  ?             PROPOFOL;  Surgeon: [name], MD;  Location: [facility]  ?             ENDOSCOPY;  Service: Gastroenterology;  Laterality: N/A; ?03/07/2019: ESOPHAGOGASTRODUODENOSCOPY (EGD) WITH PROPOFOL; N/A ?    Comment:  Procedure: ESOPHAGOGASTRODUODENOSCOPY (EGD) WITH  ?             PROPOFOL;  Surgeon: [name], MD;  Location: [facility]  ?             ENDOSCOPY;  Service: Gastroenterology;  Laterality: N/A; ?12/1984: EXCISION/RELEASE BURSA HIP; Right ?1985: HERNIA REPAIR ?12/09/2014: LEFT HEART CATHETERIZATION WITH CORONARY ANGIOGRAM; N/A ?    Comment:  Procedure: LEFT HEART CATHETERIZATION WITH CORONARY  ?             ANGIOGRAM;  Surgeon: Burnell Blanks, MD;   ?             Location: [facility] CATH LAB;  Service: Cardiovascular;   ?             Laterality: N/A; ?~ 1968: TONSILLECTOMY AND ADENOIDECTOMY ?04-06-2014: TOTAL HIP ARTHROPLASTY; Left ?6606: UMBILICAL HERNIA REPAIR ?03/2014: VENA CAVA FILTER PLACEMENT ?BMI   ? Body Mass Index: 34.06 kg/m?  ?  ? Reproductive/Obstetrics ?negative OB ROS ? ?  ? ? ? ? ? ? ? ? ? ? ? ? ? ?  ?  ? ? ? ? ? ? ? ? ?Anesthesia Physical ?Anesthesia Plan ? ?ASA: 4 ? ?Anesthesia Plan: General  ? ?Post-op Pain Management:   ? ?Induction:  ? ?PONV Risk Score and Plan:  ? ?Airway Management Planned:  ? ?Additional Equipment:  ? ?Intra-op Plan:  ? ?Post-operative Plan:  ? ?Informed Consent: I have reviewed the patients History and Physical, chart, labs and discussed the procedure including the risks, benefits and alternatives for the proposed anesthesia with the patient or authorized representative who has  indicated his/her understanding and acceptance.  ? ? ? ?Dental Advisory Given ? ?Plan Discussed with: CRNA ? ?Anesthesia Plan Comments:   ? ? ? ? ? ? ?Anesthesia Quick Evaluation ? ?

## 2021-11-05 ENCOUNTER — Encounter: Payer: Self-pay | Admitting: Gastroenterology

## 2021-11-05 LAB — SURGICAL PATHOLOGY

## 2021-11-07 DIAGNOSIS — I2699 Other pulmonary embolism without acute cor pulmonale: Secondary | ICD-10-CM | POA: Diagnosis not present

## 2021-11-07 DIAGNOSIS — M25571 Pain in right ankle and joints of right foot: Secondary | ICD-10-CM | POA: Diagnosis not present

## 2021-11-07 DIAGNOSIS — Z7901 Long term (current) use of anticoagulants: Secondary | ICD-10-CM | POA: Diagnosis not present

## 2021-11-08 DIAGNOSIS — I2699 Other pulmonary embolism without acute cor pulmonale: Secondary | ICD-10-CM | POA: Diagnosis not present

## 2021-11-08 DIAGNOSIS — Z7901 Long term (current) use of anticoagulants: Secondary | ICD-10-CM | POA: Diagnosis not present

## 2021-11-08 DIAGNOSIS — M25571 Pain in right ankle and joints of right foot: Secondary | ICD-10-CM | POA: Diagnosis not present

## 2021-11-08 DIAGNOSIS — M542 Cervicalgia: Secondary | ICD-10-CM | POA: Diagnosis not present

## 2021-11-09 ENCOUNTER — Encounter: Payer: Self-pay | Admitting: Gastroenterology

## 2021-11-15 ENCOUNTER — Other Ambulatory Visit: Payer: Self-pay | Admitting: Family Medicine

## 2021-11-15 DIAGNOSIS — R110 Nausea: Secondary | ICD-10-CM

## 2021-11-22 DIAGNOSIS — M1711 Unilateral primary osteoarthritis, right knee: Secondary | ICD-10-CM | POA: Diagnosis not present

## 2021-11-22 DIAGNOSIS — M1611 Unilateral primary osteoarthritis, right hip: Secondary | ICD-10-CM | POA: Diagnosis not present

## 2021-11-27 ENCOUNTER — Ambulatory Visit (INDEPENDENT_AMBULATORY_CARE_PROVIDER_SITE_OTHER): Payer: Medicare Other | Admitting: Family Medicine

## 2021-11-27 ENCOUNTER — Other Ambulatory Visit: Payer: Self-pay

## 2021-11-27 ENCOUNTER — Encounter: Payer: Self-pay | Admitting: Family Medicine

## 2021-11-27 DIAGNOSIS — K582 Mixed irritable bowel syndrome: Secondary | ICD-10-CM

## 2021-11-27 DIAGNOSIS — N83201 Unspecified ovarian cyst, right side: Secondary | ICD-10-CM | POA: Diagnosis not present

## 2021-11-27 DIAGNOSIS — N83202 Unspecified ovarian cyst, left side: Secondary | ICD-10-CM

## 2021-11-27 DIAGNOSIS — R252 Cramp and spasm: Secondary | ICD-10-CM | POA: Diagnosis not present

## 2021-11-27 DIAGNOSIS — J309 Allergic rhinitis, unspecified: Secondary | ICD-10-CM | POA: Diagnosis not present

## 2021-11-27 DIAGNOSIS — E785 Hyperlipidemia, unspecified: Secondary | ICD-10-CM | POA: Diagnosis not present

## 2021-11-27 DIAGNOSIS — E119 Type 2 diabetes mellitus without complications: Secondary | ICD-10-CM | POA: Diagnosis not present

## 2021-11-27 LAB — LIPID PANEL
Cholesterol: 194 mg/dL (ref 0–200)
HDL: 54 mg/dL (ref >39.00)
LDL Cholesterol: 119 mg/dL — ABNORMAL HIGH (ref 0–99)
NonHDL: 140.09
Total CHOL/HDL Ratio: 4
Triglycerides: 105 mg/dL (ref 0.0–149.0)
VLDL: 21 mg/dL (ref 0.0–40.0)

## 2021-11-27 LAB — HEMOGLOBIN A1C: Hgb A1c MFr Bld: 7.1 % — ABNORMAL HIGH (ref 4.6–6.5)

## 2021-11-27 MED ORDER — LUBIPROSTONE 8 MCG PO CAPS: 8.0000 ug | ORAL_CAPSULE | Freq: Two times a day (BID) | ORAL | 2 refills | Status: DC

## 2021-11-27 NOTE — Patient Instructions (Signed)
Nice to see you. ?We will check labs today. ?We will start you on Amitiza to help with constipation.  If you have excessive diarrhea with this or abdominal pain please let us know and discontinue the medication.  If it is excessively expensive please let me know. ?Please restart your Flonase and Astelin. ?You are due for follow-up ultrasound of your ovaries.  We will get this scheduled for you. ?

## 2021-11-27 NOTE — Assessment & Plan Note (Addendum)
Patient's IBS is likely contributing to her nausea.  She will continue on Zofran.  She has been evaluated by GI.  Discussed daily treatment for constipation.  We will start the patient on Amitiza.  If she has excessive diarrhea or she develops abdominal pain on this she will discontinue it and let us know. ?

## 2021-11-27 NOTE — Progress Notes (Signed)
?Valerie Rumps, MD ?Phone: [phone] ? ?Valerie Vazquez is a 66 y.o. female who presents today for follow-up. ? ?IBS: Patient notes fluctuating between constipation and diarrhea.  She describes the diarrhea as having sudden onset cramping followed by diarrhea.  It does not happen often.  She is constipated more frequently.  She has a bowel movement most days though she takes an enema or Dulcolax to help with this.  Does occasionally have abdominal discomfort in her mid abdomen.  She has nausea most days for which she takes Zofran.  She notes has been taking Zofran for decades.  She also takes Bentyl.  She was previously on Linzess though stopped that as her bowel movements are doing better. ? ?Chronic muscle cramps: Patient has chronic intermittent issues with muscle cramps.  These have been going on for quite some time.  She has been on baclofen for years.  She gets a little drowsy with this. ? ?Patient reports she had an injection in her knee and her ankle which were beneficial. ? ?Diabetes: She is not checking sugars.  She is taking Jardiance and Januvia.  No polyuria or polydipsia.  Rare hypoglycemia.  She is due for ophthalmology. ? ?Allergic rhinitis: Patient notes chronic issues with rhinorrhea and congestion as well as watery crusty eyes.  She notes no vision changes, pain in her eyes, itching in her eyes, foreign body sensation, or photophobia.  She previously was using several nose sprays for allergies which were beneficial for her symptoms though she is no longer using those. ? ?Social History  ? ?Tobacco Use  ?Smoking Status Former  ? Packs/day: 1.50  ? Years: 10.00  ? Pack years: 15.00  ? Types: Cigarettes  ? Quit date: 04/12/1979  ? Years since quitting: 42.6  ?Smokeless Tobacco Never  ? ? ?Current Outpatient Medications on File Prior to Visit  ?Medication Sig Dispense Refill  ? acetaminophen (TYLENOL) 500 MG tablet Take 1 tablet (500 mg total) by mouth every 6 (six) hours as needed. 30 tablet 0   ? apixaban (ELIQUIS) 5 MG TABS tablet Take 1 tablet (5 mg total) by mouth 2 (two) times daily. 60 tablet 5  ? azelastine (ASTELIN) 0.1 % nasal spray Place 2 sprays into both nostrils 2 (two) times daily. Use in each nostril as directed 30 mL 1  ? baclofen (LIORESAL) 10 MG tablet TAKE ONE TABLET BY MOUTH THREE TIMES DAILY AS NEEDED FOR MUSCLE SPASMS (VIAL) 60 tablet 0  ? benzonatate (TESSALON) 100 MG capsule Take 1 capsule (100 mg total) by mouth 3 (three) times daily as needed for cough. 21 capsule 0  ? blood glucose meter kit and supplies KIT Dispense based on patient and insurance preference. Use once daily fasting. Dx code E11.9. 1 each 0  ? Blood Glucose Monitoring Suppl (GHT BLOOD GLUCOSE MONITOR) w/Device KIT DISPENSE BASED ON PATIENT AND INSURANCE PREFERENCE. USE ONCE DAILY AS DIRECTED. DX CODE E11.9. 1 kit 0  ? carvedilol (COREG) 3.125 MG tablet Take 3.125 mg by mouth 2 (two) times daily.    ? cetirizine (ZYRTEC) 10 MG tablet TAKE 1 TABLET BY MOUTH EVERY DAY 90 tablet 3  ? dicyclomine (BENTYL) 20 MG tablet TAKE ONE TABLET BY MOUTH THREE TIMES DAILY AS NEEDED FOR ABDOMINAL SPASMS (VIAL) 270 tablet 0  ? diphenhydrAMINE (BENADRYL) 25 MG tablet Take 1 tablet (25 mg total) by mouth every 6 (six) hours. 12 tablet 0  ? empagliflozin (JARDIANCE) 25 MG TABS tablet Take 1 tablet (25 mg total) by  mouth daily. 90 tablet 1  ? enoxaparin (LOVENOX) 100 MG/ML injection Inject 0.95 mLs (95 mg total) into the skin every 12 (twelve) hours. Take a dose the morning and evening of 11/02/21 and the morning of 11/03/21. 3 mL 0  ? escitalopram (LEXAPRO) 20 MG tablet TAKE 1 TABLET BY MOUTH EVERY DAY 90 tablet 1  ? estradiol (ESTRACE) 0.1 MG/GM vaginal cream Dispose applicator, use pea size amount 3 times a week at night 42.5 g 12  ? famotidine (PEPCID) 20 MG tablet TAKE 1 TABLET BY MOUTH EVERYDAY AT BEDTIME 90 tablet 1  ? fluticasone (FLONASE) 50 MCG/ACT nasal spray Place 2 sprays into both nostrils daily. 16 g 6  ? furosemide (LASIX)  20 MG tablet Take 1 tablet (20 mg total) by mouth daily as needed for edema. 90 tablet 1  ? gabapentin (NEURONTIN) 600 MG tablet TAKE 1 TABLET IN THE AM AND 1.5 TABLETS AT BEDTIME 225 tablet 1  ? isosorbide mononitrate (IMDUR) 30 MG 24 hr tablet Take 1 tablet (30 mg total) by mouth daily. 90 tablet 2  ? levothyroxine (SYNTHROID) 25 MCG tablet TAKE 1 TABLET BY MOUTH BEFORE BREAKFAST 2-3 HOURS BEFORE TAKING ANY OTHER MEDICATION OR SUPPLEMENT 90 tablet 3  ? lidocaine (LIDODERM) 5 % Place 1 patch onto the skin daily. Remove & Discard patch within 12 hours. 30 patch 0  ? Multiple Vitamins-Minerals (SUPER THERA VITE M) TABS Take by mouth.    ? nitroGLYCERIN (NITROSTAT) 0.4 MG SL tablet Place 1 tablet (0.4 mg total) under the tongue every 5 (five) minutes as needed for chest pain. 25 tablet 4  ? OLANZapine (ZYPREXA) 10 MG tablet TAKE 1 TABLET BY MOUTH EVERYDAY AT BEDTIME (Patient taking differently: Take 15 mg by mouth at bedtime. TAKE 1 TABLET BY MOUTH EVERYDAY AT BEDTIME) 90 tablet 0  ? ondansetron (ZOFRAN) 4 MG tablet TAKE ONE TABLET BY MOUTH TWICE DAILY AS NEEDED FOR NAUSEA AND VOMITING (VIAL) 30 tablet 0  ? pantoprazole (PROTONIX) 40 MG tablet TAKE ONE TABLET BY MOUTH DAILY AT 9AM EVERY DAY 72 tablet 0  ? potassium chloride SA (KLOR-CON M20) 20 MEQ tablet TAKE 1 TABLET (20 MEQ TOTAL) BY MOUTH DAILY AS NEEDED (TAKE WITH LASIX). 90 tablet 1  ? rosuvastatin (CRESTOR) 20 MG tablet Take 1 tablet (20 mg total) by mouth daily. 90 tablet 1  ? sitaGLIPtin (JANUVIA) 100 MG tablet Take 1 tablet (100 mg total) by mouth daily. 90 tablet 1  ? triamcinolone ointment (KENALOG) 0.5 % APPLY TO AFFECTED AREA TWICE A DAY 30 g 0  ? ?No current facility-administered medications on file prior to visit.  ? ? ? ?ROS see history of present illness ? ?Objective ? ?Physical Exam ?Vitals:  ? 11/27/21 0959  ?BP: 104/70  ?Pulse: 80  ?Temp: 98.9 ?F (37.2 ?C)  ?SpO2: 97%  ? ? ?BP Readings from Last 3 Encounters:  ?11/27/21 104/70  ?11/04/21 (!)  148/76  ?10/08/21 120/76  ? ?Wt Readings from Last 3 Encounters:  ?11/27/21 213 lb (96.6 kg)  ?11/04/21 211 lb (95.7 kg)  ?10/08/21 211 lb (95.7 kg)  ? ? ?Physical Exam ?Constitutional:   ?   General: She is not in acute distress. ?   Appearance: She is not diaphoretic.  ?Eyes:  ?   Conjunctiva/sclera: Conjunctivae normal.  ?   Pupils: Pupils are equal, round, and reactive to light.  ?Cardiovascular:  ?   Rate and Rhythm: Normal rate and regular rhythm.  ?   Heart sounds: Normal  heart sounds.  ?Pulmonary:  ?   Effort: Pulmonary effort is normal.  ?   Breath sounds: Normal breath sounds.  ?Abdominal:  ?   General: There is no distension.  ?   Palpations: Abdomen is soft.  ?   Tenderness: There is no abdominal tenderness.  ?Skin: ?   General: Skin is warm and dry.  ?Neurological:  ?   Mental Status: She is alert.  ? ? ? ?Assessment/Plan: Please see individual problem list. ? ?Problem List Items Addressed This Visit   ? ? Allergic rhinitis  ?  The patient's upper respiratory and eye symptoms are likely related to allergies.  She will restart her Flonase and Astelin. ?  ?  ? Cramps, extremity  ?  Chronic issues with cramping.  She can continue baclofen.  She will not drive if she is drowsy while taking this. ?  ?  ? Cysts of both ovaries  ?  The patient was evaluated by GYN and [facility] previously.  It looks like she is overdue for repeat ultrasound imaging.  This was ordered today. ?  ?  ? Relevant Orders  ? US Pelvic Complete With Transvaginal  ? Diabetes mellitus without complication (HCC)  ?  Check A1c.  She will continue Januvia 100 mg once daily and Jardiance 25 mg daily.  Glucometer sent to pharmacy. ?  ?  ? Relevant Orders  ? HgB A1c  ? Hyperlipidemia  ?  Check lipid panel. ?  ?  ? Relevant Orders  ? Lipid panel  ? IBS (irritable bowel syndrome)  ?  Patient's IBS is likely contributing to her nausea.  She will continue on Zofran.  She has been evaluated by GI.  Discussed daily treatment for constipation.  We will  start the patient on Amitiza.  If she has excessive diarrhea or she develops abdominal pain on this she will discontinue it and let us know. ?  ?  ? Relevant Medications  ? lubiprostone (AMITIZA) 8 MCG cap

## 2021-11-27 NOTE — Assessment & Plan Note (Signed)
Check A1c.  She will continue Januvia 100 mg once daily and Jardiance 25 mg daily.  Glucometer sent to pharmacy. ?

## 2021-11-27 NOTE — Assessment & Plan Note (Signed)
The patient's upper respiratory and eye symptoms are likely related to allergies.  She will restart her Flonase and Astelin. ?

## 2021-11-27 NOTE — Assessment & Plan Note (Signed)
The patient was evaluated by GYN and [place] previously.  It looks like she is overdue for repeat ultrasound imaging.  This was ordered today. ?

## 2021-11-27 NOTE — Assessment & Plan Note (Addendum)
Chronic issues with cramping.  She can continue baclofen.  She will not drive if she is drowsy while taking this. ?

## 2021-11-27 NOTE — Assessment & Plan Note (Signed)
Check lipid panel  

## 2021-11-28 ENCOUNTER — Other Ambulatory Visit: Payer: Self-pay | Admitting: Family Medicine

## 2021-11-28 DIAGNOSIS — E785 Hyperlipidemia, unspecified: Secondary | ICD-10-CM

## 2021-12-02 ENCOUNTER — Other Ambulatory Visit: Payer: Self-pay | Admitting: Family Medicine

## 2021-12-02 DIAGNOSIS — E785 Hyperlipidemia, unspecified: Secondary | ICD-10-CM

## 2021-12-02 MED ORDER — ROSUVASTATIN CALCIUM 40 MG PO TABS: 40.0000 mg | ORAL_TABLET | Freq: Every day | ORAL | 3 refills | Status: DC

## 2021-12-04 ENCOUNTER — Ambulatory Visit
Admission: RE | Admit: 2021-12-04 | Discharge: 2021-12-04 | Disposition: A | Discharge status: Home / Self Care | Payer: Medicare Other | Source: Ambulatory Visit | Attending: Family Medicine | Admitting: Family Medicine

## 2021-12-04 DIAGNOSIS — N83202 Unspecified ovarian cyst, left side: Secondary | ICD-10-CM | POA: Insufficient documentation

## 2021-12-04 DIAGNOSIS — N83201 Unspecified ovarian cyst, right side: Secondary | ICD-10-CM | POA: Diagnosis present

## 2021-12-05 DIAGNOSIS — R293 Abnormal posture: Secondary | ICD-10-CM | POA: Diagnosis not present

## 2021-12-05 DIAGNOSIS — M5459 Other low back pain: Secondary | ICD-10-CM | POA: Diagnosis not present

## 2021-12-06 ENCOUNTER — Encounter: Payer: Self-pay | Admitting: Family Medicine

## 2021-12-06 ENCOUNTER — Encounter: Payer: Self-pay | Admitting: Cardiology

## 2021-12-06 DIAGNOSIS — N83201 Unspecified ovarian cyst, right side: Secondary | ICD-10-CM

## 2021-12-09 DIAGNOSIS — M1711 Unilateral primary osteoarthritis, right knee: Secondary | ICD-10-CM | POA: Diagnosis not present

## 2021-12-10 ENCOUNTER — Telehealth: Payer: Self-pay | Admitting: Family Medicine

## 2021-12-10 DIAGNOSIS — R110 Nausea: Secondary | ICD-10-CM

## 2021-12-10 NOTE — Telephone Encounter (Signed)
Mitzi Hansen from [facility] called about a refill for pt baclofen and ondansetron being sent to [facility] ?

## 2021-12-11 MED ORDER — BACLOFEN 10 MG PO TABS: ORAL_TABLET | ORAL | 0 refills | Status: DC

## 2021-12-11 MED ORDER — ONDANSETRON HCL 4 MG PO TABS: ORAL_TABLET | ORAL | 0 refills | Status: DC

## 2021-12-20 ENCOUNTER — Encounter: Payer: Self-pay | Admitting: Family Medicine

## 2021-12-20 DIAGNOSIS — M5416 Radiculopathy, lumbar region: Secondary | ICD-10-CM | POA: Diagnosis not present

## 2021-12-24 ENCOUNTER — Other Ambulatory Visit: Payer: Self-pay | Admitting: Family Medicine

## 2021-12-24 DIAGNOSIS — R1013 Epigastric pain: Secondary | ICD-10-CM

## 2021-12-25 DIAGNOSIS — M5416 Radiculopathy, lumbar region: Secondary | ICD-10-CM | POA: Diagnosis not present

## 2021-12-27 ENCOUNTER — Encounter: Payer: Self-pay | Admitting: Family Medicine

## 2021-12-27 ENCOUNTER — Ambulatory Visit (INDEPENDENT_AMBULATORY_CARE_PROVIDER_SITE_OTHER): Payer: Medicare Other | Admitting: Family Medicine

## 2021-12-27 VITALS — BP 110/70 | HR 97 | Temp 99.2°F | Ht 66.0 in | Wt 215.4 lb

## 2021-12-27 DIAGNOSIS — L600 Ingrowing nail: Secondary | ICD-10-CM | POA: Diagnosis not present

## 2021-12-27 MED ORDER — MUPIROCIN 2 % EX OINT: 1.0000 "application " | TOPICAL_OINTMENT | Freq: Two times a day (BID) | CUTANEOUS | 0 refills | Status: DC

## 2021-12-27 MED ORDER — TRIAMCINOLONE ACETONIDE 0.5 % EX OINT: TOPICAL_OINTMENT | CUTANEOUS | 0 refills | Status: DC

## 2021-12-27 NOTE — Patient Instructions (Signed)
Nice to see you. ?We will have you treat with topical antibiotics and topical steroids.  You should also soak your thumb 2 times daily in soap and warm water for about 10 minutes at a time. ?If you develop worsening pain, spreading redness, fevers, or any new symptoms please let us know. ?

## 2021-12-27 NOTE — Assessment & Plan Note (Signed)
Patient with a mild ingrown fingernail.  Discussed topical treatment with topical steroid and topical antibiotic.  Discussed soaking with soap and warm water 2 times daily for about 10 minutes at a time.  She was advised to let us know if she has worsening pain, erythema, or fevers.  If not improving she will let us know. ?

## 2021-12-27 NOTE — Progress Notes (Signed)
?[name], MD ?Phone: [phone] ? ?Valerie Vazquez is a 66 y.o. female who presents today for same-day visit. ? ?Ingrown fingernail: Patient notes 2 days ago she was cutting her left thumbnail and cut it too close on the ulnar aspect of the thumb.  She notes since then she has had progressively worsening pain and the nail seems to be growing into the skin.  She has no fevers. ? ?Social History  ? ?Tobacco Use  ?Smoking Status Former  ? Packs/day: 1.50  ? Years: 10.00  ? Pack years: 15.00  ? Types: Cigarettes  ? Quit date: 04/12/1979  ? Years since quitting: 42.7  ?Smokeless Tobacco Never  ? ? ?Current Outpatient Medications on File Prior to Visit  ?Medication Sig Dispense Refill  ? acetaminophen (TYLENOL) 500 MG tablet Take 1 tablet (500 mg total) by mouth every 6 (six) hours as needed. 30 tablet 0  ? azelastine (ASTELIN) 0.1 % nasal spray Place 2 sprays into both nostrils 2 (two) times daily. Use in each nostril as directed 30 mL 1  ? baclofen (LIORESAL) 10 MG tablet TAKE ONE TABLET BY MOUTH THREE TIMES DAILY AS NEEDED FOR MUSCLE SPASMS (VIAL) 60 tablet 0  ? benzonatate (TESSALON) 100 MG capsule Take 1 capsule (100 mg total) by mouth 3 (three) times daily as needed for cough. 21 capsule 0  ? blood glucose meter kit and supplies KIT Dispense based on patient and insurance preference. Use once daily fasting. Dx code E11.9. 1 each 0  ? Blood Glucose Monitoring Suppl (GHT BLOOD GLUCOSE MONITOR) w/Device KIT DISPENSE BASED ON PATIENT AND INSURANCE PREFERENCE. USE ONCE DAILY AS DIRECTED. DX CODE E11.9. 1 kit 0  ? carvedilol (COREG) 3.125 MG tablet Take 3.125 mg by mouth 2 (two) times daily.    ? cetirizine (ZYRTEC) 10 MG tablet TAKE 1 TABLET BY MOUTH EVERY DAY 90 tablet 3  ? dicyclomine (BENTYL) 20 MG tablet TAKE ONE TABLET BY MOUTH THREE TIMES DAILY AS NEEDED FOR ABDOMINAL SPASMS (VIAL) 270 tablet 11  ? diphenhydrAMINE (BENADRYL) 25 MG tablet Take 1 tablet (25 mg total) by mouth every 6 (six) hours. 12 tablet  0  ? ELIQUIS 5 MG TABS tablet TAKE ONE TABLET BY MOUTH TWICE DAILY _0  & 5PM 60 tablet 2  ? empagliflozin (JARDIANCE) 25 MG TABS tablet Take 1 tablet (25 mg total) by mouth daily. 90 tablet 1  ? enoxaparin (LOVENOX) 100 MG/ML injection Inject 0.95 mLs (95 mg total) into the skin every 12 (twelve) hours. Take a dose the morning and evening of 11/02/21 and the morning of 11/03/21. 3 mL 0  ? escitalopram (LEXAPRO) 20 MG tablet TAKE 1 TABLET BY MOUTH EVERY DAY 90 tablet 1  ? estradiol (ESTRACE) 0.1 MG/GM vaginal cream Dispose applicator, use pea size amount 3 times a week at night 42.5 g 12  ? famotidine (PEPCID) 20 MG tablet TAKE ONE TABLET BY MOUTH DAILY AT 9PM AT BEDTIME 90 tablet 11  ? fluticasone (FLONASE) 50 MCG/ACT nasal spray Place 2 sprays into both nostrils daily. 16 g 6  ? furosemide (LASIX) 20 MG tablet TAKE ONE TABLET BY MOUTH DAILY AS NEEDED for edema. (VIAL) 90 tablet 11  ? gabapentin (NEURONTIN) 600 MG tablet TAKE 1 TABLET IN THE AM AND 1.5 TABLETS AT BEDTIME 225 tablet 1  ? isosorbide mononitrate (IMDUR) 30 MG 24 hr tablet Take 1 tablet (30 mg total) by mouth daily. 90 tablet 2  ? levothyroxine (SYNTHROID) 25 MCG tablet TAKE 1 TABLET BY  MOUTH BEFORE BREAKFAST 2-3 HOURS BEFORE TAKING ANY OTHER MEDICATION OR SUPPLEMENT 90 tablet 3  ? lidocaine (LIDODERM) 5 % Place 1 patch onto the skin daily. Remove & Discard patch within 12 hours. 30 patch 0  ? lubiprostone (AMITIZA) 8 MCG capsule Take 1 capsule (8 mcg total) by mouth 2 (two) times daily with a meal. 180 capsule 2  ? Multiple Vitamins-Minerals (SUPER THERA VITE M) TABS Take by mouth.    ? nitroGLYCERIN (NITROSTAT) 0.4 MG SL tablet Place 1 tablet (0.4 mg total) under the tongue every 5 (five) minutes as needed for chest pain. 25 tablet 4  ? OLANZapine (ZYPREXA) 10 MG tablet TAKE 1 TABLET BY MOUTH EVERYDAY AT BEDTIME (Patient taking differently: Take 15 mg by mouth at bedtime. TAKE 1 TABLET BY MOUTH EVERYDAY AT BEDTIME) 90 tablet 0  ? ondansetron (ZOFRAN) 4  MG tablet TAKE ONE TABLET BY MOUTH TWICE DAILY AS NEEDED FOR NAUSEA AND VOMITING (VIAL) 30 tablet 0  ? pantoprazole (PROTONIX) 40 MG tablet TAKE ONE TABLET BY MOUTH DAILY AT 9AM 72 tablet 0  ? potassium chloride SA (KLOR-CON M) 20 MEQ tablet TAKE ONE TABLET BY MOUTH DAILY AS NEEDED TAKE WITH LASIX (VIAL) 90 tablet 11  ? rosuvastatin (CRESTOR) 40 MG tablet Take 1 tablet (40 mg total) by mouth daily. 90 tablet 3  ? sitaGLIPtin (JANUVIA) 100 MG tablet Take 1 tablet (100 mg total) by mouth daily. 90 tablet 1  ? ?No current facility-administered medications on file prior to visit.  ? ? ? ?ROS see history of present illness ? ?Objective ? ?Physical Exam ?Vitals:  ? 12/27/21 1106  ?BP: 110/70  ?Pulse: 97  ?Temp: 99.2 ?F (37.3 ?C)  ?SpO2: 96%  ? ? ?BP Readings from Last 3 Encounters:  ?12/27/21 110/70  ?11/27/21 104/70  ?11/04/21 (!) 148/76  ? ?Wt Readings from Last 3 Encounters:  ?12/27/21 215 lb 6.4 oz (97.7 kg)  ?11/27/21 213 lb (96.6 kg)  ?11/04/21 211 lb (95.7 kg)  ? ? ?Physical Exam ?Skin: ?   Comments: Ulnar aspect left thumb appears to be mildly growing into the skin, there is some tenderness, there is no significant erythema or warmth, no fluctuance, no drainage  ? ? ? ?Assessment/Plan: Please see individual problem list. ? ?Problem List Items Addressed This Visit   ? ? Ingrown fingernail - Primary  ?  Patient with a mild ingrown fingernail.  Discussed topical treatment with topical steroid and topical antibiotic.  Discussed soaking with soap and warm water 2 times daily for about 10 minutes at a time.  She was advised to let us know if she has worsening pain, erythema, or fevers.  If not improving she will let us know. ? ?  ?  ? Relevant Medications  ? mupirocin ointment (BACTROBAN) 2 %  ? triamcinolone ointment (KENALOG) 0.5 %  ? ? ?Return if symptoms worsen or fail to improve. ? ?This visit occurred during the SARS-CoV-2 public health emergency.  Safety protocols were in place, including screening questions  prior to the visit, additional usage of staff PPE, and extensive cleaning of exam room while observing appropriate contact time as indicated for disinfecting solutions.  ? ? ?[name], MD ?[place] ? ?

## 2022-01-05 IMAGING — MR MR LUMBAR SPINE W/O CM
5 series · 31 of 48 positions shown · non-contrast
Comparison: 5817

CLINICAL DATA: Chronic low back pain

EXAM:
MRI LUMBAR SPINE WITHOUT CONTRAST
TECHNIQUE: Multiplanar, multisequence MR imaging of the lumbar spine was
performed. No intravenous contrast was administered.

[Series 5: T2 · sagittal · 4.0mm · 0.81mm/px · 6 of 17 slices shown (1 of 2)]
[im 1/17]
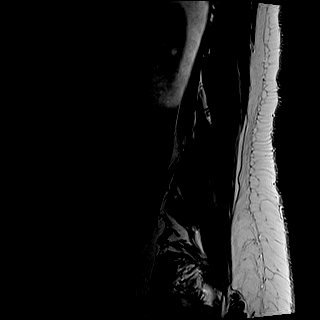
[im 4/17]
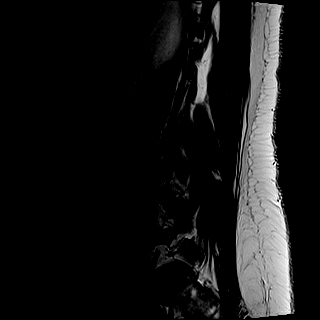
[im 7/17]
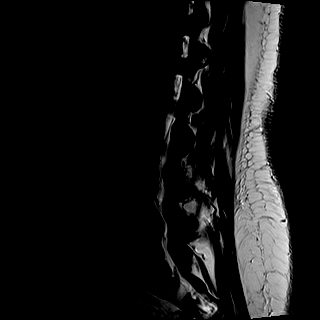
[im 10/17]
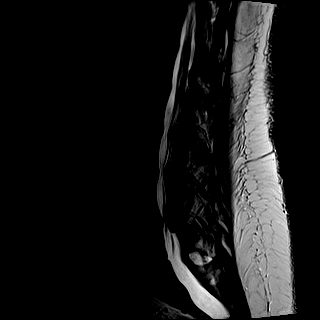
[im 13/17]
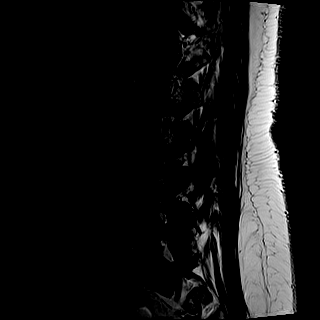
[im 17/17]
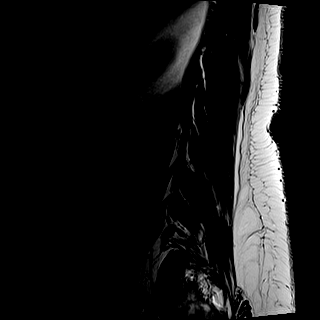

[Series 6: T1 · sagittal · 4.0mm · 0.81mm/px · 6 of 17 slices shown (1 of 2)]
[im 1/17]
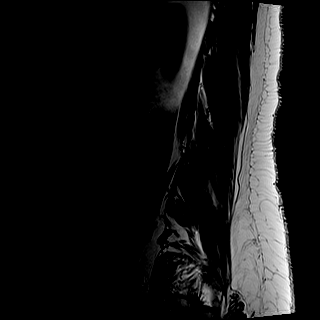
[im 4/17]
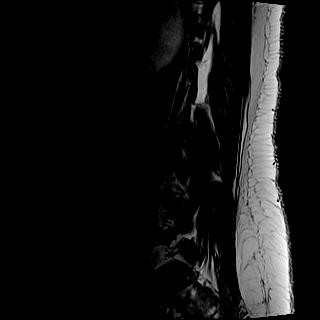
[im 7/17]
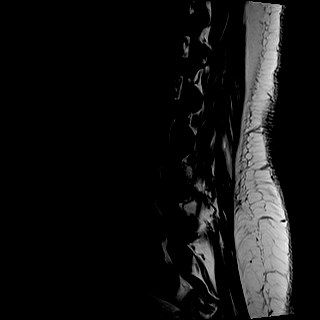
[im 10/17]
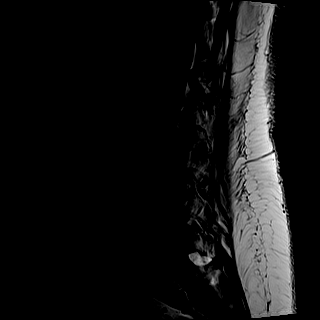
[im 13/17]
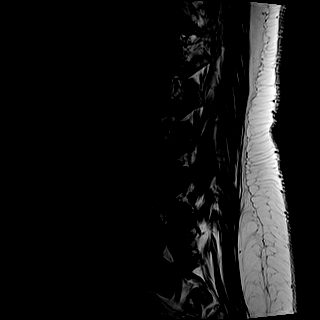
[im 17/17]
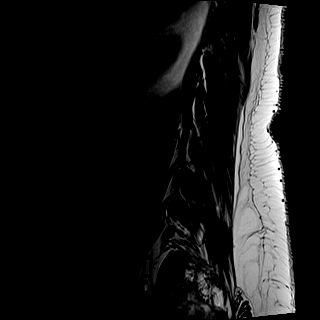

[Series 7: STIR · sagittal · 4.0mm · 0.41mm/px · 1 of 17 slices shown]
[im 1/17]
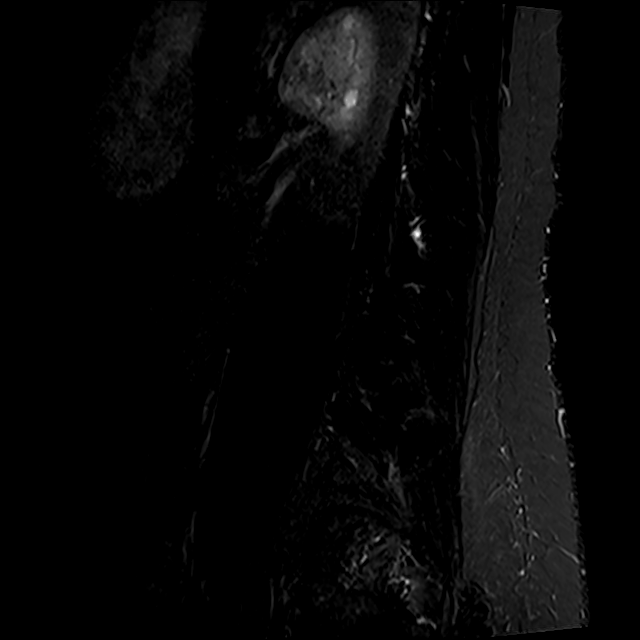

[Series 8: T2 · axial · 4.0mm · 0.78mm/px · z∈[-125,+100]mm · 9 of 39 slices shown (2 of 2)]
[im 1/39]
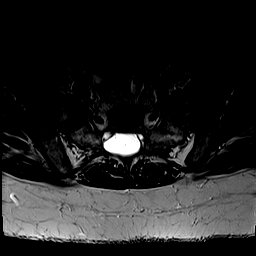
[im 6/39]
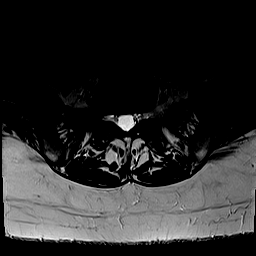
[im 11/39]
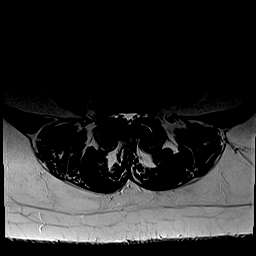
[im 17/39]
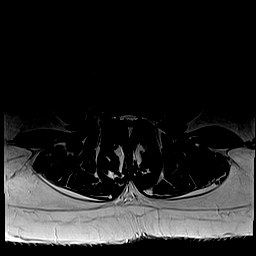
[im 20/39]
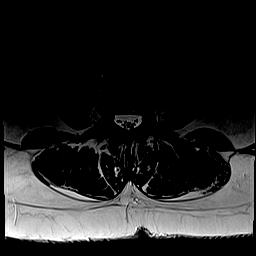
[im 22/39]
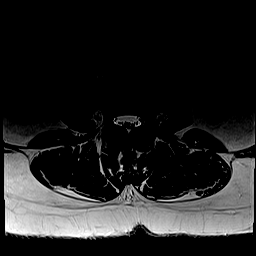
[im 28/39]
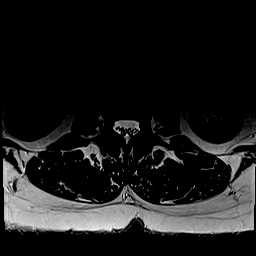
[im 33/39]
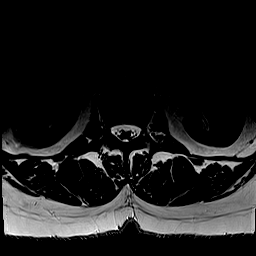
[im 39/39]
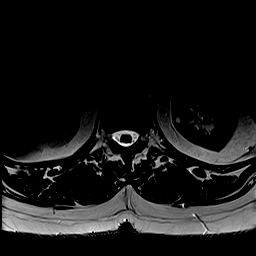

[Series 9: T1 · axial · 4.0mm · 0.39mm/px · z∈[-125,+100]mm · 9 of 39 slices shown (2 of 2)]
[im 1/39]
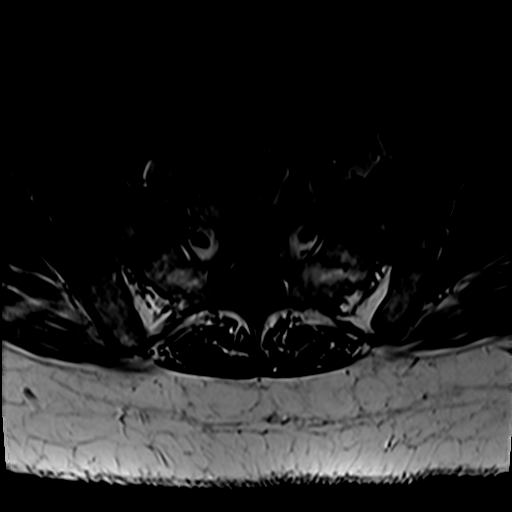
[im 6/39]
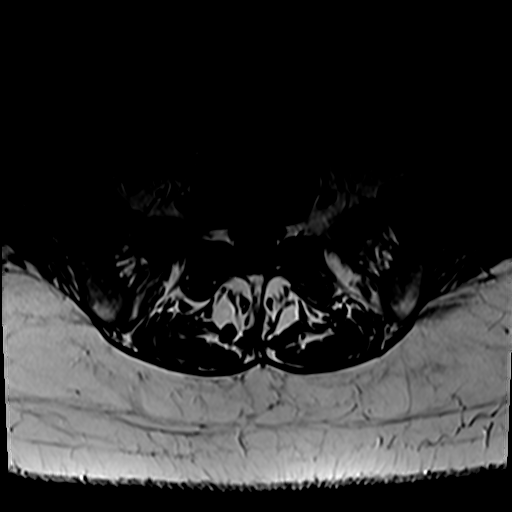
[im 11/39]
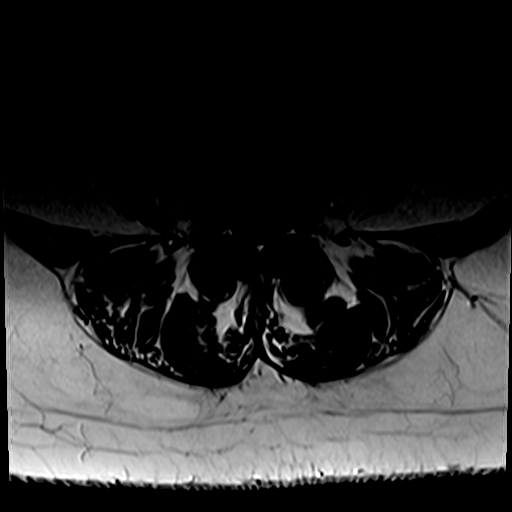
[im 17/39]
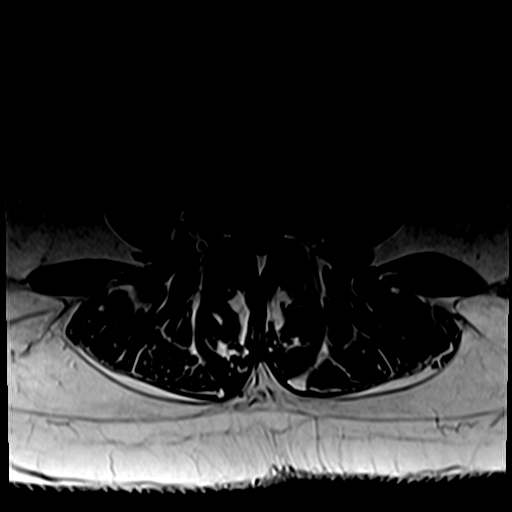
[im 20/39]
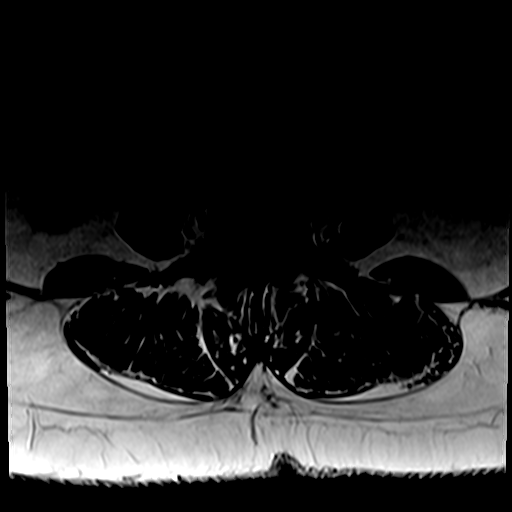
[im 22/39]
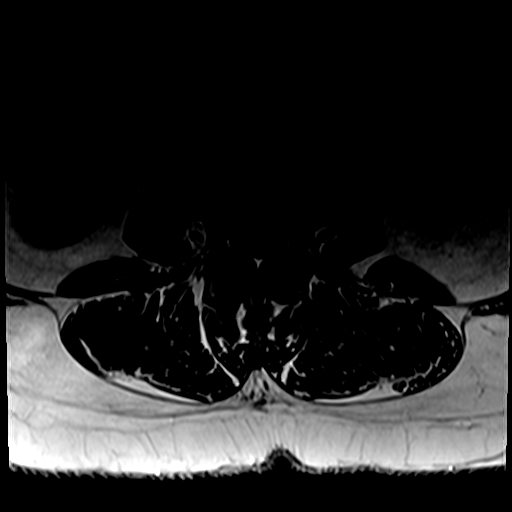
[im 28/39]
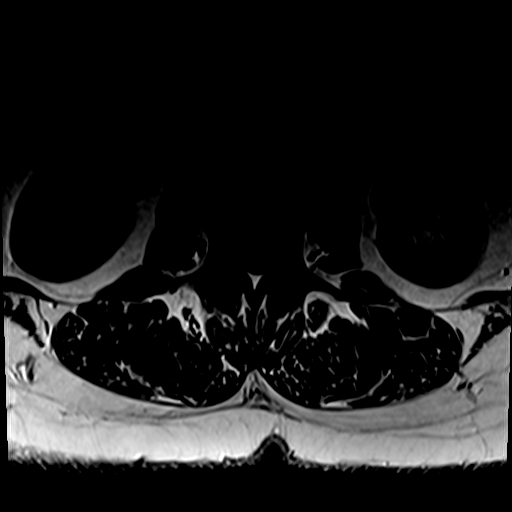
[im 33/39]
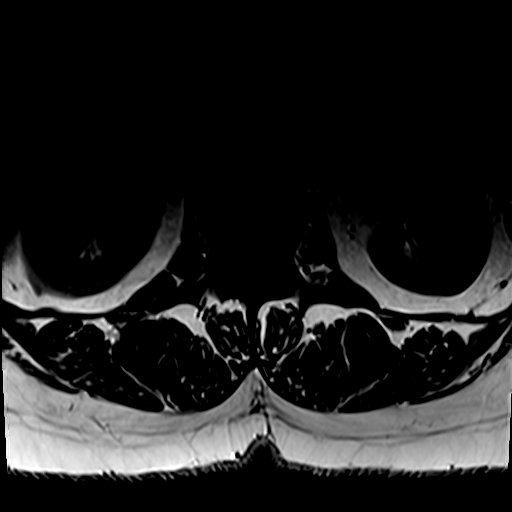
[im 39/39]
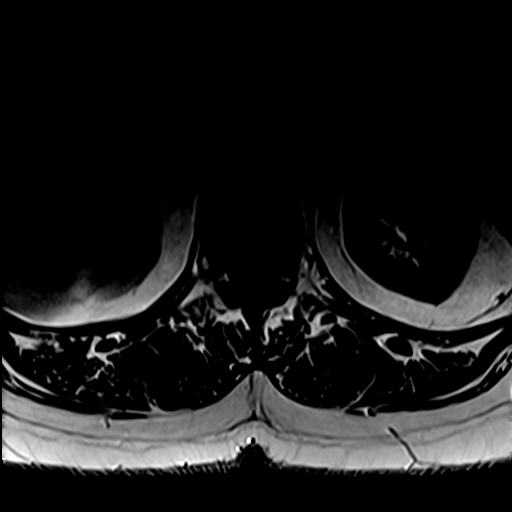

[31 of 48 positions shown; findings below may reference images not displayed]

FINDINGS: Segmentation:  Standard.

Alignment:  New trace anterolisthesis at L4-L5.

Vertebrae: Vertebral body heights are maintained. There is mild
marrow edema associated with the L4-L5 facets. No suspicious osseous
lesion.

Conus medullaris and cauda equina: Conus extends to the L2 level.
Conus and cauda equina appear normal.

Paraspinal and other soft tissues: Unremarkable.

Disc levels:

L1-L2:  Mild facet arthropathy.  No canal or foraminal stenosis.

L2-L3: Moderate right and mild left facet arthropathy with
ligamentum flavum infolding. No canal or foraminal stenosis.

L3-L4: Moderate to marked facet arthropathy with ligamentum flavum
infolding. Minor canal stenosis. No foraminal stenosis.

L4-L5: New trace anterolisthesis with disc uncovering. Marked facet
arthropathy with ligamentum flavum infolding. Mild to moderate canal
stenosis. Narrowing of the lateral recesses. Mild right foraminal
stenosis. No left foraminal stenosis.

L5-S1: Moderate to marked facet arthropathy. No canal or foraminal
stenosis.
IMPRESSION: Multilevel degenerative changes are similar in appearance to 5817
examination. There is significant multilevel facet arthropathy,
greatest at L4-L5.

## 2022-01-14 ENCOUNTER — Other Ambulatory Visit: Payer: Self-pay | Admitting: Family Medicine

## 2022-01-14 DIAGNOSIS — R110 Nausea: Secondary | ICD-10-CM

## 2022-01-16 ENCOUNTER — Encounter: Payer: Self-pay | Admitting: Obstetrics and Gynecology

## 2022-01-16 LAB — HM DIABETES EYE EXAM

## 2022-01-17 DIAGNOSIS — M5416 Radiculopathy, lumbar region: Secondary | ICD-10-CM | POA: Diagnosis not present

## 2022-01-21 DIAGNOSIS — I509 Heart failure, unspecified: Secondary | ICD-10-CM | POA: Diagnosis not present

## 2022-01-21 DIAGNOSIS — E119 Type 2 diabetes mellitus without complications: Secondary | ICD-10-CM | POA: Diagnosis not present

## 2022-01-21 DIAGNOSIS — F32A Depression, unspecified: Secondary | ICD-10-CM | POA: Diagnosis not present

## 2022-01-21 DIAGNOSIS — K219 Gastro-esophageal reflux disease without esophagitis: Secondary | ICD-10-CM | POA: Diagnosis not present

## 2022-01-22 ENCOUNTER — Other Ambulatory Visit: Payer: Medicare Other

## 2022-01-22 ENCOUNTER — Telehealth: Payer: Self-pay | Admitting: Family Medicine

## 2022-01-23 ENCOUNTER — Other Ambulatory Visit (INDEPENDENT_AMBULATORY_CARE_PROVIDER_SITE_OTHER): Payer: Medicare Other

## 2022-01-23 DIAGNOSIS — E785 Hyperlipidemia, unspecified: Secondary | ICD-10-CM | POA: Diagnosis not present

## 2022-01-23 LAB — HEPATIC FUNCTION PANEL
ALT: 21 U/L (ref 0–35)
AST: 20 U/L (ref 0–37)
Albumin: 3.9 g/dL (ref 3.5–5.2)
Alkaline Phosphatase: 102 U/L (ref 39–117)
Bilirubin, Direct: 0.1 mg/dL (ref 0.0–0.3)
Total Bilirubin: 0.5 mg/dL (ref 0.2–1.2)
Total Protein: 6.5 g/dL (ref 6.0–8.3)

## 2022-01-23 LAB — LDL CHOLESTEROL, DIRECT: Direct LDL: 102 mg/dL

## 2022-01-23 NOTE — Telephone Encounter (Signed)
Patient will call back regarding permission to come off her blood thinner before surgery.

## 2022-01-24 ENCOUNTER — Other Ambulatory Visit: Payer: Self-pay | Admitting: Family Medicine

## 2022-01-24 DIAGNOSIS — F411 Generalized anxiety disorder: Secondary | ICD-10-CM

## 2022-01-24 NOTE — Telephone Encounter (Signed)
Patient states [facility] faxed a request to [place] on 01/22/2022, to stop her Eliquis prior to her dental surgery.  Patient states Dr. Caryl Bis will need to sign it and then we can fax it back to them.  Patient states she would like for [place] to call her when it has been faxed.

## 2022-01-24 NOTE — Telephone Encounter (Signed)
Pt is aware that Dr. Caryl Bis will not be back in office until Tuesday. Pt stated that the dental surgery has not been scheduled yet.

## 2022-01-30 ENCOUNTER — Ambulatory Visit (INDEPENDENT_AMBULATORY_CARE_PROVIDER_SITE_OTHER): Payer: Medicare Other | Admitting: Obstetrics and Gynecology

## 2022-01-30 ENCOUNTER — Other Ambulatory Visit: Payer: Self-pay

## 2022-01-30 ENCOUNTER — Encounter: Payer: Self-pay | Admitting: Obstetrics and Gynecology

## 2022-01-30 VITALS — BP 122/80 | Ht 66.0 in | Wt 220.0 lb

## 2022-01-30 DIAGNOSIS — E785 Hyperlipidemia, unspecified: Secondary | ICD-10-CM

## 2022-01-30 DIAGNOSIS — Z712 Person consulting for explanation of examination or test findings: Secondary | ICD-10-CM

## 2022-01-30 MED ORDER — ROSUVASTATIN CALCIUM 40 MG PO TABS: 40.0000 mg | ORAL_TABLET | Freq: Every day | ORAL | 1 refills | Status: DC

## 2022-01-30 NOTE — Progress Notes (Signed)
66 yo P4 presenting to discuss ultrasound results. Patient reports feeling well without complaints. She has had a hysterectomy for fibroid uterus. She denies any pelvic pain or abnormal discharge.  Past Medical History:  Diagnosis Date   Acute right-sided low back pain with right-sided sciatica 10/23/2017   Anemia    Bilateral renal cysts    CAD (coronary artery disease) 09/28/2016   Chest pain 10/14/2015   Chronic back pain    "upper and lower" (09/19/2014)   Chronic shoulder pain (Location of Primary Source of Pain) (Left) 01/29/2016   Colitis 04/08/2016   Colon polyp 01/30/2017   Coronary vasospasm ([place]) 12/09/2014   Depression    "major" (09/19/2014)   DVT (deep venous thrombosis) (HCC)    Esophageal spasm    Essential hypertension 10/28/2013   Gallstones    Gastritis    Gastroenteritis    GERD (gastroesophageal reflux disease)    Headache    "couple /month lately" (09/19/2014)   Heart disease 12/09/2016   High blood pressure    High cholesterol    History of blood transfusion 1985   related to hysterectomy   History of nuclear stress test    Myoview 1/19:  EF 65, no ischemia or scar   History of pulmonary embolus (PE) 09/19/2014   Hx of cervical spine surgery 01/29/2016   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 Rule out Paget's disease of the bone with blood work.    Hyperlipidemia 10/28/2013   Hypertension    IBS (irritable bowel syndrome)    Lumbar central spinal stenosis (moderate at L4-5; mild at L2-3 and L3-4) 01/29/2016   Migraine    "a few times/yr" (09/19/2014)   Myocardial infarction ([place]) 10/2010 X 3   "while hospitalized"   Neck pain 01/29/2016   Osteoarthritis    "qwhere; mostly around my joints" (09/19/2014)   Pleural effusion, bilateral 07/31/2015   Pneumonia 04/2014   PTSD (post-traumatic stress disorder)    Pulmonary embolism ([place]) 04/2001; 09/19/2014   "after gallbladder OR; "   Schizoaffective disorder ([place])    Schizoaffective disorder,  depressive type ([place]) 08/28/2016   Sleep apnea    "mild" (09/19/2014)   Snoring    a. sleep study 5/16: No OSA   SOB (shortness of breath) 07/31/2015   Spinal stenosis    Spondylosis    Suicide attempt ([place]) 08/27/2016   Type II diabetes mellitus ([place])    "dx'd in 2007; lost weight; no RX for ~ 4 yr now" (09/19/2014)   Wheezing 02/25/2017   Past Surgical History:  Procedure Laterality Date   [place] DECOMP/DISCECTOMY FUSION  10/2012   in [place] C5-C7    [place] Right    BREAST EXCISIONAL BIOPSY Right YRS AGO   NEG   CARDIAC CATHETERIZATION   2000's; 2009   CHOLECYSTECTOMY  2002   COLONOSCOPY WITH PROPOFOL N/A 12/12/2016   Procedure: COLONOSCOPY WITH PROPOFOL;  Surgeon: [name], MD;  Location: [facility] ENDOSCOPY;  Service: Endoscopy;  Laterality: N/A;   COLONOSCOPY WITH PROPOFOL N/A 02/17/2017   Procedure: COLONOSCOPY WITH PROPOFOL;  Surgeon: [name], MD;  Location: [facility] ENDOSCOPY;  Service: Gastroenterology;  Laterality: N/A;   COLONOSCOPY WITH PROPOFOL N/A 11/04/2021   Procedure: COLONOSCOPY WITH PROPOFOL;  Surgeon: [name], MD;  Location: [facility] ENDOSCOPY;  Service: Gastroenterology;  Laterality: N/A;   ESOPHAGOGASTRODUODENOSCOPY (EGD) WITH PROPOFOL N/A 02/17/2017   Procedure:  ESOPHAGOGASTRODUODENOSCOPY (EGD) WITH PROPOFOL;  Surgeon: [name], MD;  Location: [facility] ENDOSCOPY;  Service: Gastroenterology;  Laterality: N/A;   ESOPHAGOGASTRODUODENOSCOPY (EGD) WITH PROPOFOL N/A 03/07/2019   Procedure: ESOPHAGOGASTRODUODENOSCOPY (EGD) WITH PROPOFOL;  Surgeon: [name], MD;  Location: [facility] ENDOSCOPY;  Service: Gastroenterology;  Laterality: N/A;   EXCISION/RELEASE BURSA HIP Right 12/1984   HERNIA REPAIR  1985   LEFT HEART CATHETERIZATION WITH CORONARY ANGIOGRAM N/A 12/09/2014   Procedure: LEFT HEART CATHETERIZATION WITH CORONARY ANGIOGRAM;  Surgeon: Burnell Blanks, MD;  Location: [facility] CATH  LAB;  Service: Cardiovascular;  Laterality: N/A;   TONSILLECTOMY AND ADENOIDECTOMY  ~ 1968   TOTAL HIP ARTHROPLASTY Left [id number]   UMBILICAL HERNIA REPAIR  1985   VENA CAVA FILTER PLACEMENT  03/2014   Family History  Problem Relation Age of Onset   Stroke Mother        Deceased   Lung cancer Father        Deceased   Healthy Daughter    Healthy Son        x 2   Other Son        Suicide   Cancer Brother    Heart attack Neg Hx    Social History   Tobacco Use   Smoking status: Former    Packs/day: 1.50    Years: 10.00    Pack years: 15.00    Types: Cigarettes    Quit date: 04/12/1979    Years since quitting: 42.8   Smokeless tobacco: Never  Vaping Use   Vaping Use: Never used  Substance Use Topics   Alcohol use: No    Alcohol/week: 0.0 standard drinks   Drug use: No   ROS See pertinent in HPI. All other systems reviewed and non contributory Blood pressure 122/80, height '5\' 6"'$  (1.676 m), weight 220 lb (99.8 kg). GENERAL: Well-developed, well-nourished female in no acute distress.  NEURO: alert and oriented x 3  US Pelvic Complete With Transvaginal  Result Date: 12/06/2021 CLINICAL DATA:  Bilateral ovarian cysts. EXAM: TRANSABDOMINAL AND TRANSVAGINAL ULTRASOUND OF PELVIS TECHNIQUE: Both transabdominal and transvaginal ultrasound examinations of the pelvis were performed. Transabdominal technique was performed for global imaging of the pelvis including uterus, ovaries, adnexal regions, and pelvic cul-de-sac. It was necessary to proceed with endovaginal exam following the transabdominal exam to visualize the ovaries. COMPARISON:  Pelvic ultrasound 09/26/2019 and 09/08/2018 FINDINGS: Uterus Surgically absent. Right ovary Measurements: 2.7 x 2.4 x 1.8 cm = volume: 6.0 mL. 2.2 x 1.8 x 1.8 cm simple cyst (previously 1.9 x 1.7 x 1.7 cm in 2021 and 2.5 x 2.1 x 2.4 cm in 2020). Left ovary Measurements: 3.7 x 2.0 x 2.8 cm = volume: 11.0 mL. 2.2 x 1.7 x 2.5 cm simple cyst (previously 2.1  x 2.0 x 1.7 cm in 2021 and 1.9 x 1.7 x 2.2 cm in 2020 Other findings No abnormal free fluid. IMPRESSION: Small, simple bilateral ovarian cysts without significant interval change. No suspicious lesion. No follow-up imaging is recommended. Reference: Radiology 2019 Nov;293(2):359-371 Electronically Signed   By: Logan Bores M.D.   On: 12/06/2021 10:02    A/P 66 yo P4 with bilateral ovarian cyst - Reviewed ultrasound report with patient - Discussed likely benign condition as measurements have been stable since 09/2019 - Patient desires surgical removal- Will refer to Dr. Amalia Hailey - Patient is current on mammogram and colonoscopy

## 2022-01-31 ENCOUNTER — Telehealth: Payer: Self-pay

## 2022-01-31 ENCOUNTER — Ambulatory Visit: Payer: Medicare Other

## 2022-01-31 LAB — HM DIABETES FOOT EXAM

## 2022-01-31 LAB — HEMOGLOBIN A1C: Hemoglobin A1C: 6.6

## 2022-01-31 NOTE — Telephone Encounter (Signed)
No answer when called for scheduled AWV. Unable to connect on telephone numbers listed on chart. Left message to reschedule.

## 2022-02-03 ENCOUNTER — Telehealth: Payer: Self-pay | Admitting: Family Medicine

## 2022-02-03 NOTE — Telephone Encounter (Signed)
Pt called in stating that she is waiting on Dr. Caryl Bis to reach out to her dentist about her procedure because she have blood clot... Pt doesn't know what Dr. Caryl Bis was suppose to speak with her dentist about...  Pt requesting callback.Marland KitchenMarland Kitchen

## 2022-02-04 NOTE — Telephone Encounter (Signed)
Patient called about her clearance for her dental procedure. Please call patient to let her know if it has been fax out.

## 2022-02-05 ENCOUNTER — Encounter: Payer: Self-pay | Admitting: Family Medicine

## 2022-02-05 ENCOUNTER — Ambulatory Visit (INDEPENDENT_AMBULATORY_CARE_PROVIDER_SITE_OTHER): Payer: Medicare Other

## 2022-02-05 VITALS — Ht 66.0 in | Wt 219.0 lb

## 2022-02-05 DIAGNOSIS — Z0000 Encounter for general adult medical examination without abnormal findings: Secondary | ICD-10-CM

## 2022-02-05 MED ORDER — ENOXAPARIN SODIUM 100 MG/ML IJ SOSY: 1.0000 mg/kg | PREFILLED_SYRINGE | Freq: Two times a day (BID) | INTRAMUSCULAR | 0 refills | Status: DC

## 2022-02-05 NOTE — Patient Instructions (Addendum)
  Ms. Bhagat , Thank you for taking time to come for your Medicare Wellness Visit. I appreciate your ongoing commitment to your health goals. Please review the following plan we discussed and let me know if I can assist you in the future.   These are the goals we discussed:  Goals       Patient Stated     Weight loss goal <200lb (pt-stated)      Healthy portion control meals      Other     Increase water intake        This is a list of the screening recommended for you and due dates:  Health Maintenance  Topic Date Due   Eye exam for diabetics  03/07/2022*   Tetanus Vaccine  02/06/2023*   Flu Shot  04/01/2022   Complete foot exam   05/15/2022   Hemoglobin A1C  05/30/2022   Urine Protein Check  08/27/2022   Mammogram  10/26/2023   Colon Cancer Screening  11/04/2024   Pneumonia Vaccine  Completed   DEXA scan (bone density measurement)  Completed   Hepatitis C Screening: USPSTF Recommendation to screen - Ages 64-79 yo.  Completed   HPV Vaccine  Aged Out   COVID-19 Vaccine  Discontinued   Zoster (Shingles) Vaccine  Discontinued  *Topic was postponed. The date shown is not the original due date.

## 2022-02-05 NOTE — Telephone Encounter (Signed)
I don't see the form from the dentist. It was in my basket yesterday. The form I have is from Orthopedics. I will complete that form. Please see if you have the form from the dentist and if you do not please see if they can resend it.   The patient will take her last dose of eliquis the evening of 02/08/22. The patient will need to bridge with lovenox. She will take lovenox the morning and evening of 6/11 and 6/12 and then on the morning of 6/13. She will not take any lovenox after that. She can resume the eliquis 24 hours after her procedure at the discretion of the physician performing the procedure.   I sent the lovenox to [facility] in [place].

## 2022-02-05 NOTE — Progress Notes (Signed)
Subjective:   Valerie Vazquez is a 66 y.o. female who presents for Medicare Annual (Subsequent) preventive examination.  Review of Systems    No ROS.  Medicare Wellness Virtual Visit.  Visual/audio telehealth visit, UTA vital signs.   See social history for additional risk factors.   Cardiac Risk Factors include: advanced age (>57men, >32 women);diabetes mellitus;hypertension     Objective:    Today's Vitals   02/05/22 1403  Weight: 219 lb (99.3 kg)  Height: $Remove'5\' 6"'cxIoGzg$  (1.676 m)   Body mass index is 35.35 kg/m.     02/05/2022    2:10 PM 11/04/2021    8:18 AM 01/30/2021   11:34 AM 11/07/2020   12:26 PM 09/06/2020    2:59 PM 08/10/2020    6:27 PM 06/01/2020    4:57 PM  Advanced Directives  Does Patient Have a Medical Advance Directive? No No No No No No No  Would patient like information on creating a medical advance directive? No - Patient declined  No - Patient declined    No - Patient declined    Current Medications (verified) Outpatient Encounter Medications as of 02/05/2022  Medication Sig   acetaminophen (TYLENOL) 500 MG tablet Take 1 tablet (500 mg total) by mouth every 6 (six) hours as needed.   baclofen (LIORESAL) 10 MG tablet TAKE ONE TABLET BY MOUTH THREE TIMES DAILY AS NEEDED FOR MUSCLE SPASMS (VIAL)   benzonatate (TESSALON) 100 MG capsule Take 1 capsule (100 mg total) by mouth 3 (three) times daily as needed for cough.   blood glucose meter kit and supplies KIT Dispense based on patient and insurance preference. Use once daily fasting. Dx code E11.9.   Blood Glucose Monitoring Suppl (GHT BLOOD GLUCOSE MONITOR) w/Device KIT DISPENSE BASED ON PATIENT AND INSURANCE PREFERENCE. USE ONCE DAILY AS DIRECTED. DX CODE E11.9.   dicyclomine (BENTYL) 20 MG tablet TAKE ONE TABLET BY MOUTH THREE TIMES DAILY AS NEEDED FOR ABDOMINAL SPASMS (VIAL)   diphenhydrAMINE (BENADRYL) 25 MG tablet Take 1 tablet (25 mg total) by mouth every 6 (six) hours.   ELIQUIS 5 MG TABS tablet TAKE ONE TABLET BY  MOUTH TWICE DAILY $RemoveBef'@9AM'PBzHjKixMb$  & 5PM   empagliflozin (JARDIANCE) 25 MG TABS tablet Take 1 tablet (25 mg total) by mouth daily.   enoxaparin (LOVENOX) 100 MG/ML injection Inject 1 mL (100 mg total) into the skin every 12 (twelve) hours. Take the morning and evening of 02/09/22 and 02/10/22, take in the morning of 02/11/22   escitalopram (LEXAPRO) 20 MG tablet TAKE 1 TABLET BY MOUTH EVERY DAY   estradiol (ESTRACE) 0.1 MG/GM vaginal cream Dispose applicator, use pea size amount 3 times a week at night   famotidine (PEPCID) 20 MG tablet TAKE ONE TABLET BY MOUTH DAILY AT 9PM AT BEDTIME   fluticasone (FLONASE) 50 MCG/ACT nasal spray Place 2 sprays into both nostrils daily.   furosemide (LASIX) 20 MG tablet TAKE ONE TABLET BY MOUTH DAILY AS NEEDED for edema. (VIAL)   gabapentin (NEURONTIN) 600 MG tablet TAKE ONE TABLET BY MOUTH DAILY AT 9AM and TAKE 1 AND 1/2 TABLETS DAILY AT 9PM AT BEDTIME   isosorbide mononitrate (IMDUR) 30 MG 24 hr tablet Take 1 tablet (30 mg total) by mouth daily.   levothyroxine (SYNTHROID) 25 MCG tablet TAKE 1 TABLET BY MOUTH BEFORE BREAKFAST 2-3 HOURS BEFORE TAKING ANY OTHER MEDICATION OR SUPPLEMENT   lidocaine (LIDODERM) 5 % Place 1 patch onto the skin daily. Remove & Discard patch within 12 hours.   lubiprostone (  AMITIZA) 8 MCG capsule Take 1 capsule (8 mcg total) by mouth 2 (two) times daily with a meal.   Multiple Vitamins-Minerals (SUPER THERA VITE M) TABS Take by mouth.   mupirocin ointment (BACTROBAN) 2 % Apply 1 application. topically 2 (two) times daily.   nitroGLYCERIN (NITROSTAT) 0.4 MG SL tablet Place 1 tablet (0.4 mg total) under the tongue every 5 (five) minutes as needed for chest pain.   OLANZapine (ZYPREXA) 10 MG tablet TAKE 1 TABLET BY MOUTH EVERYDAY AT BEDTIME (Patient taking differently: Take 15 mg by mouth at bedtime. TAKE 1 TABLET BY MOUTH EVERYDAY AT BEDTIME)   ondansetron (ZOFRAN) 4 MG tablet TAKE ONE TABLET BY MOUTH TWICE DAILY AS NEEDED FOR NAUSEA AND VOMITING (VIAL)    pantoprazole (PROTONIX) 40 MG tablet TAKE ONE TABLET BY MOUTH DAILY AT 9AM   potassium chloride SA (KLOR-CON M) 20 MEQ tablet TAKE ONE TABLET BY MOUTH DAILY AS NEEDED TAKE WITH LASIX (VIAL)   rosuvastatin (CRESTOR) 40 MG tablet Take 1 tablet (40 mg total) by mouth daily.   sitaGLIPtin (JANUVIA) 100 MG tablet Take 1 tablet (100 mg total) by mouth daily.   triamcinolone ointment (KENALOG) 0.5 % APPLY TO AFFECTED AREA TWICE A DAY, for up to a week   No facility-administered encounter medications on file as of 02/05/2022.    Allergies (verified) Abilify [aripiprazole], Augmentin [amoxicillin-pot clavulanate], Bactrim [sulfamethoxazole-trimethoprim], Ceftin [cefuroxime axetil], Coconut (cocos nucifera), Diclofenac, Dye fdc red [red dye], Indocin [indomethacin], Linaclotide, Lisinopril, Morphine, Morphine and related, Simvastatin, Sulfa antibiotics, Sulfamethoxazole-trimethoprim, Wellbutrin [bupropion], Zetia [ezetimibe], Quetiapine fumarate, and Quetiapine fumarate   History: 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   Depression    "major" (09/19/2014)   DVT (deep venous thrombosis) (HCC)    Esophageal spasm    Essential hypertension 10/28/2013   Gallstones    Gastritis    Gastroenteritis    GERD (gastroesophageal reflux disease)    Headache    "couple /month lately" (09/19/2014)   Heart disease 12/09/2016   High blood pressure    High cholesterol    History of blood transfusion 1985   related to hysterectomy   History of nuclear stress test    Myoview 1/19:  EF 65, no ischemia or scar   History of pulmonary  embolus (PE) 09/19/2014   Hx of cervical spine surgery 01/29/2016   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 Rule out Paget's disease of the bone with blood work.    Hyperlipidemia 10/28/2013   Hypertension    IBS (irritable bowel syndrome)    Lumbar central spinal stenosis (moderate at L4-5; mild at L2-3 and L3-4) 01/29/2016   Migraine    "a few times/yr" (09/19/2014)   Myocardial infarction (HCC) 10/2010 X 3   "while hospitalized"   Neck pain 01/29/2016   Osteoarthritis    "qwhere; mostly around my joints" (09/19/2014)   Pleural effusion, bilateral 07/31/2015   Pneumonia 04/2014   PTSD (post-traumatic stress disorder)    Pulmonary embolism (HCC) 04/2001; 09/19/2014   "after gallbladder OR; "   Schizoaffective disorder (HCC)  Schizoaffective disorder, depressive type (HCC) 08/28/2016   Sleep apnea    "mild" (09/19/2014)   Snoring    a. sleep study 5/16: No OSA   SOB (shortness of breath) 07/31/2015   Spinal stenosis    Spondylosis    Suicide attempt (HCC) 08/27/2016   Type II diabetes mellitus (HCC)    "dx'd in 2007; lost weight; no RX for ~ 4 yr now" (09/19/2014)   Wheezing 02/25/2017   Past Surgical History:  Procedure Laterality Date   ABDOMINAL HYSTERECTOMY  1985   ANTERIOR CERVICAL DECOMP/DISCECTOMY FUSION  10/2012   in [place] C5-C7    APPENDECTOMY  1985   BACK SURGERY     BREAST CYST EXCISION Right    BREAST EXCISIONAL BIOPSY Right YRS AGO   NEG   CARDIAC CATHETERIZATION   2000's; 2009   CHOLECYSTECTOMY  2002   COLONOSCOPY WITH PROPOFOL N/A 12/12/2016   Procedure: COLONOSCOPY WITH PROPOFOL;  Surgeon: Wyline Mood, MD;  Location: [facility] ENDOSCOPY;  Service: Endoscopy;  Laterality: N/A;   COLONOSCOPY WITH PROPOFOL N/A 02/17/2017   Procedure: COLONOSCOPY WITH PROPOFOL;  Surgeon: Wyline Mood, MD;  Location: [facility] ENDOSCOPY;  Service: Gastroenterology;  Laterality: N/A;   COLONOSCOPY WITH PROPOFOL N/A 11/04/2021   Procedure: COLONOSCOPY WITH PROPOFOL;  Surgeon:  Wyline Mood, MD;  Location: [facility] ENDOSCOPY;  Service: Gastroenterology;  Laterality: N/A;   ESOPHAGOGASTRODUODENOSCOPY (EGD) WITH PROPOFOL N/A 02/17/2017   Procedure: ESOPHAGOGASTRODUODENOSCOPY (EGD) WITH PROPOFOL;  Surgeon: Wyline Mood, MD;  Location: [facility] ENDOSCOPY;  Service: Gastroenterology;  Laterality: N/A;   ESOPHAGOGASTRODUODENOSCOPY (EGD) WITH PROPOFOL N/A 03/07/2019   Procedure: ESOPHAGOGASTRODUODENOSCOPY (EGD) WITH PROPOFOL;  Surgeon: Wyline Mood, MD;  Location: [facility] ENDOSCOPY;  Service: Gastroenterology;  Laterality: N/A;   EXCISION/RELEASE BURSA HIP Right 12/1984   HERNIA REPAIR  1985   LEFT HEART CATHETERIZATION WITH CORONARY ANGIOGRAM N/A 12/09/2014   Procedure: LEFT HEART CATHETERIZATION WITH CORONARY ANGIOGRAM;  Surgeon: Kathleene Hazel, MD;  Location: [facility] CATH LAB;  Service: Cardiovascular;  Laterality: N/A;   TONSILLECTOMY AND ADENOIDECTOMY  ~ 1968   TOTAL HIP ARTHROPLASTY Left 04-06-2014   UMBILICAL HERNIA REPAIR  1985   VENA CAVA FILTER PLACEMENT  03/2014   Family History  Problem Relation Age of Onset   Stroke Mother        Deceased   Lung cancer Father        Deceased   Healthy Daughter    Healthy Son        x 2   Other Son        Suicide   Cancer Brother    Heart attack Neg Hx    Social History   Socioeconomic History   Marital status: Divorced    Spouse name: Not on file   Number of children: Not on file   Years of education: Not on file   Highest education level: Not on file  Occupational History   Not on file  Tobacco Use   Smoking status: Former    Packs/day: 1.50    Years: 10.00    Pack years: 15.00    Types: Cigarettes    Quit date: 04/12/1979    Years since quitting: 42.8   Smokeless tobacco: Never  Vaping Use   Vaping Use: Never used  Substance and Sexual Activity   Alcohol use: No    Alcohol/week: 0.0 standard drinks   Drug use: No   Sexual activity: Not Currently  Other Topics Concern   Not on file  Social History Narrative  Lives with fiancee in an apartment on the first floor.  Has 3 grown children.  One child passed away.  On disability November 17, 1994 - 2000, 2007 - current.     Education: some college.   Social Determinants of Health   Financial Resource Strain: Low Risk    Difficulty of Paying Living Expenses: Not hard at all  Food Insecurity: No Food Insecurity   Worried About Charity fundraiser in the Last Year: Never true   [place] in the Last Year: Never true  Transportation Needs: No Transportation Needs   Lack of Transportation (Medical): No   Lack of Transportation (Non-Medical): No  Physical Activity: Not on file  Stress: No Stress Concern Present   Feeling of Stress : Not at all  Social Connections: Unknown   Frequency of Communication with Friends and Family: More than three times a week   Frequency of Social Gatherings with Friends and Family: More than three times a week   Attends Religious Services: Not on Electrical engineer or Organizations: Not on file   Attends Archivist Meetings: Not on file   Marital Status: Not on file    Tobacco Counseling Counseling given: Not Answered   Clinical Intake:  Pre-visit preparation completed: Yes        Diabetes: Yes (Followed by PCP)  How often do you need to have someone help you when you read instructions, pamphlets, or other written materials from your doctor or pharmacy?: 1 - Never  Interpreter Needed?: No      Activities of Daily Living    02/05/2022    2:11 PM  In your present state of health, do you have any difficulty performing the following activities:  Hearing? 0  Vision? 0  Difficulty concentrating or making decisions? 0  Walking or climbing stairs? 1  Comment Chronic back pain. Ambulates with rollator walker as needed.  Dressing or bathing? 0  Doing errands, shopping? 1  Preparing Food and eating ? N  Using the Toilet? N  In the past six months, have you accidently leaked urine? N  Do you  have problems with loss of bowel control? Y  Managing your Medications? N  Managing your Finances? N  Housekeeping or managing your Housekeeping? N    Patient Care Team: [name], MD as PCP - General (Family Medicine) Kate Sable, MD as PCP - Cardiology (Cardiology) Clent Jacks, RN as Oncology Nurse Navigator  Indicate any recent Medical Services you may have received from other than [facility] providers in the past year (date may be approximate).     Assessment:   This is a routine wellness examination for Valerie Vazquez.  Virtual Visit via Telephone Note  I connected with  Valerie Vazquez on 02/05/22 at  2:00 PM EDT by telephone and verified that I am speaking with the correct person using two identifiers.  Persons participating in the virtual visit: patient/Nurse Health Advisor   I discussed the limitations of performing an evaluation and management service by telehealth. We continued and completed visit with audio only. Some vital signs may be absent or patient reported.   Hearing/Vision screen Hearing Screening - Comments:: Patient has difficulty hearing conversational tones in a crowd. Audiology deferred per patient preference.  Vision Screening - Comments:: Followed by [facility] No retinopathy reported. Annual visit.   Dietary issues and exercise activities discussed: Current Exercise Habits: Home exercise routine, Intensity: Mild Healthy diet Good water intake  Goals Addressed               This Visit's Progress     Patient Stated     Weight loss goal <200lb (pt-stated)   On track     Healthy portion control meals       Depression Screen    02/05/2022    2:06 PM 12/27/2021   11:07 AM 10/08/2021    9:34 AM 09/24/2021   10:11 AM 08/27/2021    9:51 AM 05/24/2021   10:25 AM 01/30/2021   11:37 AM  PHQ 2/9 Scores  PHQ - 2 Score 0 0 0 0 0 0 0    Fall Risk    02/05/2022    2:11 PM 12/27/2021   11:07 AM 10/08/2021    9:34 AM 09/24/2021    10:11 AM 08/27/2021    9:51 AM  Fall Risk   Falls in the past year? 0 0 0 0 0  Number falls in past yr: 0 0 0 0 0  Injury with Fall?  0 0 0 0  Risk for fall due to : Impaired balance/gait No Fall Risks No Fall Risks No Fall Risks No Fall Risks  Risk for fall due to: Comment Walker in use as needed      Follow up Falls evaluation completed Falls evaluation completed Falls evaluation completed Falls evaluation completed Falls evaluation completed    FALL RISK PREVENTION PERTAINING TO THE HOME: Home free of loose throw rugs in walkways, pet beds, electrical cords, etc? Yes  Adequate lighting in your home to reduce risk of falls? Yes   ASSISTIVE DEVICES UTILIZED TO PREVENT FALLS: Use of a cane, walker or w/c? Yes , walker in use as needed.  TIMED UP AND GO: Was the test performed? No .   Cognitive Function: Patient is alert and oriented x3.    01/22/2018    9:56 AM 01/20/2017    4:43 PM  MMSE - Mini Mental State Exam  Orientation to time    Orientation to Place    Registration    Attention/ Calculation    Recall    Language- name 2 objects    Language- repeat    Language- follow 3 step command    Language- read & follow direction    Write a sentence    Copy design    Total score       Information is confidential and restricted. Go to Review Flowsheets to unlock data.        01/30/2021   11:39 AM 01/27/2020   11:12 AM 01/25/2019   12:02 PM 01/20/2017    4:30 PM  6CIT Screen  What Year? 0 points 0 points    What month? 0 points 0 points    What time? 0 points 0 points    Count back from 20 0 points     Months in reverse 0 points 0 points    Repeat phrase 0 points 0 points    Total Score 0 points        Information is confidential and restricted. Go to Review Flowsheets to unlock data.    Immunizations Immunization History  Administered Date(s) Administered   PNEUMOCOCCAL CONJUGATE-20 08/27/2021   TDAP status: Due, Education has been provided regarding the  importance of this vaccine. Advised may receive this vaccine at local pharmacy or Health Dept. Aware to provide a copy of the vaccination record if obtained from local pharmacy or Health Dept. Verbalized acceptance and understanding.Deferred.  Screening Tests Health Maintenance  Topic Date Due   OPHTHALMOLOGY EXAM  03/07/2022 (Originally 10/27/2020)   TETANUS/TDAP  02/06/2023 (Originally [birth year]02/1975)   INFLUENZA VACCINE  04/01/2022   FOOT EXAM  05/15/2022   HEMOGLOBIN A1C  05/30/2022   URINE MICROALBUMIN  08/27/2022   MAMMOGRAM  10/26/2023   COLONOSCOPY (Pts 45-47yrs Insurance coverage will need to be confirmed)  11/04/2024   Pneumonia Vaccine 78+ Years old  Completed   DEXA SCAN  Completed   Hepatitis C Screening  Completed   HPV VACCINES  Aged Out   COVID-19 Vaccine  Discontinued   Zoster Vaccines- Shingrix  Discontinued   Health Maintenance There are no preventive care reminders to display for this patient.  Lung Cancer Screening: (Low Dose CT Chest recommended if Age 73-80 years, 30 pack-year currently smoking OR have quit w/in 15years.) does not qualify.   Vision Screening: Recommended annual ophthalmology exams for early detection of glaucoma and other disorders of the eye.  Dental Screening: Recommended annual dental exams for proper oral hygiene  Community Resource Referral / Chronic Care Management: CRR required this visit?  No   CCM required this visit?  No      Plan:   Keep all routine maintenance appointments.   I have personally reviewed and noted the following in the patient's chart:   Medical and social history Use of alcohol, tobacco or illicit drugs  Current medications and supplements including opioid prescriptions.  Functional ability and status Nutritional status Physical activity Advanced directives List of other physicians Hospitalizations, surgeries, and ER visits in previous 12 months Vitals Screenings to include cognitive, depression, and  falls Referrals and appointments  In addition, I have reviewed and discussed with patient certain preventive protocols, quality metrics, and best practice recommendations. A written personalized care plan for preventive services as well as general preventive health recommendations were provided to patient.     Varney Biles, LPN   09/01/5724

## 2022-02-06 ENCOUNTER — Encounter: Payer: Self-pay | Admitting: Obstetrics and Gynecology

## 2022-02-06 ENCOUNTER — Ambulatory Visit (INDEPENDENT_AMBULATORY_CARE_PROVIDER_SITE_OTHER): Payer: Medicare Other | Admitting: Obstetrics and Gynecology

## 2022-02-06 VITALS — BP 124/81 | Ht 66.0 in | Wt 220.2 lb

## 2022-02-06 DIAGNOSIS — N83202 Unspecified ovarian cyst, left side: Secondary | ICD-10-CM

## 2022-02-06 DIAGNOSIS — N83201 Unspecified ovarian cyst, right side: Secondary | ICD-10-CM

## 2022-02-06 NOTE — Progress Notes (Signed)
HPI:      Valerie Vazquez is a 66 y.o. [id number] who LMP was No LMP recorded. Patient has had a hysterectomy.  Subjective:   She presents today to discuss bilateral ovarian cysts.  She generally feels well and has no pelvic concerns.  She does state that occasionally she has right and left side pain which she reports as a dull achy pain.  She states that this pain is not disabling. Significant history includes history of hysterectomy. Her bilateral ovarian cyst have been present for several years without appreciable change.    Hx: The following portions of the patient's history were reviewed and updated as appropriate:             She  has a past medical history of Acute right-sided low back pain with right-sided sciatica (10/23/2017), Anemia, Bilateral renal cysts, CAD (coronary artery disease) (09/28/2016), Chest pain (10/14/2015), Chronic back pain, Chronic shoulder pain (Location of Primary Source of Pain) (Left) (01/29/2016), Colitis (04/08/2016), Colon polyp (01/30/2017), Coronary vasospasm (HCC) (12/09/2014), Depression, DVT (deep venous thrombosis) ([place]), Esophageal spasm, Essential hypertension (10/28/2013), Gallstones, Gastritis, Gastroenteritis, GERD (gastroesophageal reflux disease), Headache, Heart disease (12/09/2016), High blood pressure, High cholesterol, History of blood transfusion (1985), History of nuclear stress test, History of pulmonary embolus (PE) (09/19/2014), cervical spine surgery (01/29/2016), Hypercementosis (02/13/2015), Hyperlipidemia (10/28/2013), Hypertension, IBS (irritable bowel syndrome), Lumbar central spinal stenosis (moderate at L4-5; mild at L2-3 and L3-4) (01/29/2016), Migraine, Myocardial infarction ([place]) (10/2010 X 3), Neck pain (01/29/2016), Osteoarthritis, Pleural effusion, bilateral (07/31/2015), Pneumonia (04/2014), PTSD (post-traumatic stress disorder), Pulmonary embolism ([place]) (04/2001; 09/19/2014), Schizoaffective disorder ([place]), Schizoaffective disorder, depressive type  ([place]) (08/28/2016), Sleep apnea, Snoring, SOB (shortness of breath) (07/31/2015), Spinal stenosis, Spondylosis, Suicide attempt ([place]) (08/27/2016), Type II diabetes mellitus ([place]), and Wheezing (02/25/2017). She does not have any pertinent problems on file. She  has a past surgical history that includes Excision/release bursa hip (Right, 12/1984); Total hip arthroplasty (Left, 04-06-2014); Tonsillectomy and adenoidectomy (~ 1968); Appendectomy (1985); Cholecystectomy (2002); Hernia repair (1985); Umbilical hernia repair (1985); Vena cava filter placement (03/2014); Back surgery; Anterior cervical decomp/discectomy fusion (10/2012); Breast cyst excision (Right); Cardiac catheterization ( 2000's; 2009); left heart catheterization with coronary angiogram (N/A, 12/09/2014); Colonoscopy with propofol (N/A, 12/12/2016); Colonoscopy with propofol (N/A, 02/17/2017); Esophagogastroduodenoscopy (egd) with propofol (N/A, 02/17/2017); Abdominal hysterectomy (1985); Esophagogastroduodenoscopy (egd) with propofol (N/A, 03/07/2019); Breast excisional biopsy (Right, YRS AGO); and Colonoscopy with propofol (N/A, 11/04/2021). Her family history includes Cancer in her brother; Healthy in her daughter and son; Lung cancer in her father; Other in her son; Stroke in her mother. She  reports that she quit smoking about 42 years ago. Her smoking use included cigarettes. She has a 15.00 pack-year smoking history. She has never used smokeless tobacco. She reports that she does not drink alcohol and does not use drugs. She has a current medication list which includes the following prescription(s): acetaminophen, baclofen, benzonatate, blood glucose meter kit and supplies, ght blood glucose monitor, dicyclomine, diphenhydramine, eliquis, empagliflozin, enoxaparin, escitalopram, estradiol, famotidine, fluticasone, furosemide, gabapentin, isosorbide mononitrate, levothyroxine, lidocaine, lubiprostone, super thera vite m, mupirocin ointment, olanzapine,  ondansetron, pantoprazole, potassium chloride sa, rosuvastatin, sitagliptin, and triamcinolone ointment. She is allergic to abilify [aripiprazole], augmentin [amoxicillin-pot clavulanate], bactrim [sulfamethoxazole-trimethoprim], ceftin [cefuroxime axetil], coconut (cocos nucifera), diclofenac, dye fdc red [red dye], indocin [indomethacin], linaclotide, lisinopril, morphine, morphine and related, simvastatin, sulfa antibiotics, sulfamethoxazole-trimethoprim, wellbutrin [bupropion], zetia [ezetimibe], quetiapine fumarate, and quetiapine fumarate.       Review of Systems:  Review of Systems  Constitutional: Denied constitutional symptoms, night sweats, recent illness, fatigue, fever, insomnia and weight loss.  Eyes: Denied eye symptoms, eye pain, photophobia, vision change and visual disturbance.  Ears/Nose/Throat/Neck: Denied ear, nose, throat or neck symptoms, hearing loss, nasal discharge, sinus congestion and sore throat.  Cardiovascular: Denied cardiovascular symptoms, arrhythmia, chest pain/pressure, edema, exercise intolerance, orthopnea and palpitations.  Respiratory: Denied pulmonary symptoms, asthma, pleuritic pain, productive sputum, cough, dyspnea and wheezing.  Gastrointestinal: Denied, gastro-esophageal reflux, melena, nausea and vomiting.  Genitourinary: See HPI for additional information.  Musculoskeletal: Denied musculoskeletal symptoms, stiffness, swelling, muscle weakness and myalgia.  Dermatologic: Denied dermatology symptoms, rash and scar.  Neurologic: Denied neurology symptoms, dizziness, headache, neck pain and syncope.  Psychiatric: Denied psychiatric symptoms, anxiety and depression.  Endocrine: Denied endocrine symptoms including hot flashes and night sweats.   Meds:   Current Outpatient Medications on File Prior to Visit  Medication Sig Dispense Refill   acetaminophen (TYLENOL) 500 MG tablet Take 1 tablet (500 mg total) by mouth every 6 (six) hours as needed. 30  tablet 0   baclofen (LIORESAL) 10 MG tablet TAKE ONE TABLET BY MOUTH THREE TIMES DAILY AS NEEDED FOR MUSCLE SPASMS (VIAL) 60 tablet 0   benzonatate (TESSALON) 100 MG capsule Take 1 capsule (100 mg total) by mouth 3 (three) times daily as needed for cough. 21 capsule 0   blood glucose meter kit and supplies KIT Dispense based on patient and insurance preference. Use once daily fasting. Dx code E11.9. 1 each 0   Blood Glucose Monitoring Suppl (GHT BLOOD GLUCOSE MONITOR) w/Device KIT DISPENSE BASED ON PATIENT AND INSURANCE PREFERENCE. USE ONCE DAILY AS DIRECTED. DX CODE E11.9. 1 kit 0   dicyclomine (BENTYL) 20 MG tablet TAKE ONE TABLET BY MOUTH THREE TIMES DAILY AS NEEDED FOR ABDOMINAL SPASMS (VIAL) 270 tablet 11   diphenhydrAMINE (BENADRYL) 25 MG tablet Take 1 tablet (25 mg total) by mouth every 6 (six) hours. 12 tablet 0   ELIQUIS 5 MG TABS tablet TAKE ONE TABLET BY MOUTH TWICE DAILY _0  & 5PM 60 tablet 2   empagliflozin (JARDIANCE) 25 MG TABS tablet Take 1 tablet (25 mg total) by mouth daily. 90 tablet 1   enoxaparin (LOVENOX) 100 MG/ML injection Inject 1 mL (100 mg total) into the skin every 12 (twelve) hours. Take the morning and evening of 02/09/22 and 02/10/22, take in the morning of 02/11/22 5 mL 0   escitalopram (LEXAPRO) 20 MG tablet TAKE 1 TABLET BY MOUTH EVERY DAY 90 tablet 1   estradiol (ESTRACE) 0.1 MG/GM vaginal cream Dispose applicator, use pea size amount 3 times a week at night 42.5 g 12   famotidine (PEPCID) 20 MG tablet TAKE ONE TABLET BY MOUTH DAILY AT 9PM AT BEDTIME 90 tablet 11   fluticasone (FLONASE) 50 MCG/ACT nasal spray Place 2 sprays into both nostrils daily. 16 g 6   furosemide (LASIX) 20 MG tablet TAKE ONE TABLET BY MOUTH DAILY AS NEEDED for edema. (VIAL) 90 tablet 11   gabapentin (NEURONTIN) 600 MG tablet TAKE ONE TABLET BY MOUTH DAILY AT 9AM and TAKE 1 AND 1/2 TABLETS DAILY AT 9PM AT BEDTIME 225 tablet 11   isosorbide mononitrate (IMDUR) 30 MG 24 hr tablet Take 1 tablet (30  mg total) by mouth daily. 90 tablet 2   levothyroxine (SYNTHROID) 25 MCG tablet TAKE 1 TABLET BY MOUTH BEFORE BREAKFAST 2-3 HOURS BEFORE TAKING ANY OTHER MEDICATION OR SUPPLEMENT 90 tablet 3   lidocaine (LIDODERM) 5 % Place 1 patch onto the skin  daily. Remove & Discard patch within 12 hours. 30 patch 0   lubiprostone (AMITIZA) 8 MCG capsule Take 1 capsule (8 mcg total) by mouth 2 (two) times daily with a meal. 180 capsule 2   Multiple Vitamins-Minerals (SUPER THERA VITE M) TABS Take by mouth.     mupirocin ointment (BACTROBAN) 2 % Apply 1 application. topically 2 (two) times daily. 22 g 0   OLANZapine (ZYPREXA) 10 MG tablet TAKE 1 TABLET BY MOUTH EVERYDAY AT BEDTIME (Patient taking differently: Take 15 mg by mouth at bedtime. TAKE 1 TABLET BY MOUTH EVERYDAY AT BEDTIME) 90 tablet 0   ondansetron (ZOFRAN) 4 MG tablet TAKE ONE TABLET BY MOUTH TWICE DAILY AS NEEDED FOR NAUSEA AND VOMITING (VIAL) 30 tablet 0   pantoprazole (PROTONIX) 40 MG tablet TAKE ONE TABLET BY MOUTH DAILY AT 9AM 72 tablet 11   potassium chloride SA (KLOR-CON M) 20 MEQ tablet TAKE ONE TABLET BY MOUTH DAILY AS NEEDED TAKE WITH LASIX (VIAL) 90 tablet 11   rosuvastatin (CRESTOR) 40 MG tablet Take 1 tablet (40 mg total) by mouth daily. 90 tablet 1   sitaGLIPtin (JANUVIA) 100 MG tablet Take 1 tablet (100 mg total) by mouth daily. 90 tablet 1   triamcinolone ointment (KENALOG) 0.5 % APPLY TO AFFECTED AREA TWICE A DAY, for up to a week 30 g 0   No current facility-administered medications on file prior to visit.      Objective:     Vitals:   02/06/22 1415  BP: 124/81   Filed Weights   02/06/22 1415  Weight: 220 lb 3.2 oz (99.9 kg)              Ultrasound results reviewed directly with the patient including ultrasounds over the last few years.          Assessment:    [id number] Patient Active Problem List   Diagnosis Date Noted   Ingrown fingernail 12/27/2021   Cramps, extremity 11/27/2021   Pain in right thigh  09/26/2021   Chronic pain of right knee 09/26/2021   History of low back pain 09/26/2021   Skin lesion of lower extremity- right knee  09/26/2021   Rash 08/27/2021   Bulimia 01/14/2021   Cervical radiculitis 11/15/2020   Right arm pain 11/12/2020   Gross hematuria 09/14/2020   Bilateral low back pain without sciatica 06/20/2020   Paronychia of great toe of right foot 04/05/2020   Paronychia of great toe, right 02/27/2020   Foraminal stenosis of cervical region 01/31/2020   Cervical fusion syndrome (C5-C7 2014) 01/31/2020   Ingrown toenail 01/27/2020   Chronic diastolic heart failure ([place]) 01/27/2020   Postmenopausal estrogen deficiency 08/12/2019   Cysts of both ovaries 06/15/2019   Left wrist pain 05/20/2019   Allergic rhinitis 01/27/2019   Pain of right lower extremity 10/06/2018   Urinary incontinence 10/27/2017   Elevated alkaline phosphatase level 10/27/2017   Hypothyroidism 08/26/2017   Diabetes mellitus without complication ([place]) 16/38/4665   Right upper quadrant pain 01/12/2017   Plantar fasciitis of left foot 11/07/2016   CAD (coronary artery disease) 09/28/2016   Epigastric discomfort 09/16/2016   Schizoaffective disorder, depressive type ([place]) 08/28/2016   Cervicalgia 01/29/2016   Chronic pain syndrome 01/29/2016   Primary osteoarthritis of right hip 01/29/2016   Lumbar central spinal stenosis (moderate at L4-5; mild at L2-3 and L3-4) 01/29/2016   Breast pain 10/19/2015   Pleural effusion, bilateral 07/31/2015   Cough 07/31/2015   Esophageal spasm 06/05/2015   History of DVT (  deep vein thrombosis) 02/25/2015   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   Constipation 12/12/2014   Coronary vasospasm ([place]) 12/09/2014   History of pulmonary embolus (PE) 09/19/2014   Anxiety and depression 04/13/2014   IBS (irritable bowel syndrome) 04/11/2014   GERD (gastroesophageal reflux disease) 04/11/2014   Insomnia 04/11/2014   Essential hypertension 10/28/2013   Anemia 10/28/2013    Hyperlipidemia 10/28/2013     1. Cysts of both ovaries     Cysts appear benign and have not shown appreciable change over several years.  They are small.  They may or may not be the source of her occasional pelvic discomfort. (She also has some issues with her bowel which may actually be the source of her abdominal discomfort)   Plan:            1.  I have reassured her regarding the benign nature of the cyst.  I explained to her that while we could be the source of her occasional pelvic pain the also may not be this source.  She has decided to take some time to think about her options.  I have informed her I will be happy to remove her ovaries if she desires this or I would be just is happy not to take them out if she is comfortable.  Once she has decided she will make an appointment for preop or for further follow-up if desired.  Orders No orders of the defined types were placed in this encounter.   No orders of the defined types were placed in this encounter.     F/U  Return for Pt to contact us if symptoms worsen. I spent 25 minutes involved in the care of this patient preparing to see the patient by obtaining and reviewing her medical history (including labs, imaging tests and prior procedures), documenting clinical information in the electronic health record (EHR), counseling and coordinating care plans, writing and sending prescriptions, ordering tests or procedures and in direct communicating with the patient and medical staff discussing pertinent items from her history and physical exam.  Finis Bud, M.D. 02/06/2022 2:39 PM

## 2022-02-06 NOTE — Telephone Encounter (Signed)
I called and spoke with the patient this morning about the eliquis and gave her the instructions and I also let her know I would send the instructions  to her via mychart just incase she forgot and she understood.  She stated she may wait on the dentist because now she has to have her ovaries removed soon.  Kagen Kunath,cma

## 2022-02-06 NOTE — Telephone Encounter (Signed)
Pt called in requesting for a prescription for Lovenox shot... Pt stated that she have an upcoming procedure and pt was wondering if she need to start coming off medication eliquis... Pt stated that she sent a mychart message yesterday... Please look at message below... Pt requesting callback.Marland KitchenMarland Kitchen

## 2022-02-06 NOTE — Telephone Encounter (Signed)
I called and spoke with the patient and explained the bridge. She understood.  Darrian Goodwill,cma

## 2022-02-06 NOTE — Progress Notes (Signed)
Patient presents today for surgery consult due to ovarian cysts. She states she has had these cysts for over one year and they cause occasional dull aches. Patient has had a hysterectomy and salpingectomy in the past. Patient states no other questions at this time.

## 2022-02-07 NOTE — Telephone Encounter (Signed)
Noted  

## 2022-02-11 DIAGNOSIS — M25871 Other specified joint disorders, right ankle and foot: Secondary | ICD-10-CM | POA: Diagnosis not present

## 2022-02-11 DIAGNOSIS — M19071 Primary osteoarthritis, right ankle and foot: Secondary | ICD-10-CM | POA: Diagnosis not present

## 2022-02-11 DIAGNOSIS — M25371 Other instability, right ankle: Secondary | ICD-10-CM | POA: Diagnosis not present

## 2022-02-11 DIAGNOSIS — M25571 Pain in right ankle and joints of right foot: Secondary | ICD-10-CM | POA: Diagnosis not present

## 2022-02-12 DIAGNOSIS — E119 Type 2 diabetes mellitus without complications: Secondary | ICD-10-CM | POA: Diagnosis not present

## 2022-02-12 DIAGNOSIS — M5416 Radiculopathy, lumbar region: Secondary | ICD-10-CM | POA: Diagnosis not present

## 2022-02-13 ENCOUNTER — Other Ambulatory Visit: Payer: Self-pay | Admitting: Family Medicine

## 2022-02-13 DIAGNOSIS — R110 Nausea: Secondary | ICD-10-CM

## 2022-02-27 ENCOUNTER — Other Ambulatory Visit: Payer: Self-pay | Admitting: Family Medicine

## 2022-02-28 ENCOUNTER — Ambulatory Visit: Payer: Medicare Other | Admitting: Family Medicine

## 2022-03-05 ENCOUNTER — Ambulatory Visit (INDEPENDENT_AMBULATORY_CARE_PROVIDER_SITE_OTHER): Payer: Medicare Other | Admitting: Family Medicine

## 2022-03-05 ENCOUNTER — Encounter: Payer: Self-pay | Admitting: Family Medicine

## 2022-03-05 DIAGNOSIS — N3281 Overactive bladder: Secondary | ICD-10-CM

## 2022-03-05 DIAGNOSIS — R200 Anesthesia of skin: Secondary | ICD-10-CM

## 2022-03-05 DIAGNOSIS — E119 Type 2 diabetes mellitus without complications: Secondary | ICD-10-CM

## 2022-03-05 DIAGNOSIS — K582 Mixed irritable bowel syndrome: Secondary | ICD-10-CM

## 2022-03-05 DIAGNOSIS — N83201 Unspecified ovarian cyst, right side: Secondary | ICD-10-CM

## 2022-03-05 DIAGNOSIS — N83202 Unspecified ovarian cyst, left side: Secondary | ICD-10-CM

## 2022-03-05 MED ORDER — SITAGLIPTIN PHOSPHATE 100 MG PO TABS: ORAL_TABLET | ORAL | 3 refills | Status: DC

## 2022-03-05 MED ORDER — EMPAGLIFLOZIN 25 MG PO TABS: ORAL_TABLET | ORAL | 3 refills | Status: DC

## 2022-03-05 NOTE — Assessment & Plan Note (Signed)
Urge incontinence issues are likely related to overactive bladder.  Discussed timed voiding.  Discussed that the cheaper medication options would not be a great option for her given her constipation issues.  Discussed that other medication options can be quite expensive.  If timed voiding is not beneficial she will let me know.

## 2022-03-05 NOTE — Progress Notes (Signed)
[name], MD Phone: [phone]  Valerie Vazquez is a 66 y.o. female who presents today for f/u.  DIABETES Disease Monitoring: Blood Sugar ranges-198 today Polyuria/phagia/dipsia- notes polydipsia and polyuria at times      Optho- UTD Medications: Compliance- taking [place] and jardiance Hypoglycemic symptoms- no  Constipation: This is a chronic issue.  Amitiza gave her diarrhea.  She does have bowel movements daily that she does strain.  She has seen GI in the past.  Ovarian cyst: Patient has seen GYN for this.  She notes the most recent GYN she saw said he did not think she he needs to have her ovaries removed.  Right foot numbness: Patient notes this has been going on for several months.  Notes initially she could not feel it after she got out of bed.  It has improved to a certain degree.  It is now mostly on the dorsal aspect of the foot though does have numbness throughout the foot and up into her lower leg just above her ankle on the right.  She does follow with a spine specialist for lumbar degenerative changes and had a recent injection.  She notes occasionally her legs feel weak.  Overactive bladder: Patient notes urge incontinence at times.  If she can get to the bathroom quickly enough she will have an accident.  Social History   Tobacco Use  Smoking Status Former   Packs/day: 1.50   Years: 10.00   Total pack years: 15.00   Types: Cigarettes   Quit date: 04/12/1979   Years since quitting: 42.9  Smokeless Tobacco Never    Current Outpatient Medications on File Prior to Visit  Medication Sig Dispense Refill   acetaminophen (TYLENOL) 500 MG tablet Take 1 tablet (500 mg total) by mouth every 6 (six) hours as needed. 30 tablet 0   baclofen (LIORESAL) 10 MG tablet TAKE ONE TABLET BY MOUTH THREE TIMES DAILY AS NEEDED FOR MUSCLE SPASMS (VIAL) 60 tablet 0   benzonatate (TESSALON) 100 MG capsule Take 1 capsule (100 mg total) by mouth 3 (three) times daily as needed for  cough. 21 capsule 0   blood glucose meter kit and supplies KIT Dispense based on patient and insurance preference. Use once daily fasting. Dx code E11.9. 1 each 0   Blood Glucose Monitoring Suppl (GHT BLOOD GLUCOSE MONITOR) w/Device KIT DISPENSE BASED ON PATIENT AND INSURANCE PREFERENCE. USE ONCE DAILY AS DIRECTED. DX CODE E11.9. 1 kit 0   dicyclomine (BENTYL) 20 MG tablet TAKE ONE TABLET BY MOUTH THREE TIMES DAILY AS NEEDED FOR ABDOMINAL SPASMS (VIAL) 270 tablet 11   diphenhydrAMINE (BENADRYL) 25 MG tablet Take 1 tablet (25 mg total) by mouth every 6 (six) hours. 12 tablet 0   ELIQUIS 5 MG TABS tablet TAKE ONE TABLET BY MOUTH TWICE DAILY $RemoveBef'@9AM'[id number]$  & 5PM 60 tablet 11   enoxaparin (LOVENOX) 100 MG/ML injection Inject 1 mL (100 mg total) into the skin every 12 (twelve) hours. Take the morning and evening of 02/09/22 and 02/10/22, take in the morning of 02/11/22 5 mL 0   escitalopram (LEXAPRO) 20 MG tablet TAKE 1 TABLET BY MOUTH EVERY DAY 90 tablet 1   estradiol (ESTRACE) 0.1 MG/GM vaginal cream Dispose applicator, use pea size amount 3 times a week at night 42.5 g 12   famotidine (PEPCID) 20 MG tablet TAKE ONE TABLET BY MOUTH DAILY AT 9PM AT BEDTIME 90 tablet 11   fluticasone (FLONASE) 50 MCG/ACT nasal spray Place 2 sprays into both nostrils daily. [place]  g 6   furosemide (LASIX) 20 MG tablet TAKE ONE TABLET BY MOUTH DAILY AS NEEDED for edema. (VIAL) 90 tablet 11   gabapentin (NEURONTIN) 600 MG tablet TAKE ONE TABLET BY MOUTH DAILY AT 9AM and TAKE 1 AND 1/2 TABLETS DAILY AT 9PM AT BEDTIME 225 tablet 11   isosorbide mononitrate (IMDUR) 30 MG 24 hr tablet Take 1 tablet (30 mg total) by mouth daily. 90 tablet 2   levothyroxine (SYNTHROID) 25 MCG tablet TAKE 1 TABLET BY MOUTH BEFORE BREAKFAST 2-3 HOURS BEFORE TAKING ANY OTHER MEDICATION OR SUPPLEMENT 90 tablet 3   lidocaine (LIDODERM) 5 % Place 1 patch onto the skin daily. Remove & Discard patch within 12 hours. 30 patch 0   Multiple Vitamins-Minerals (SUPER THERA  VITE M) TABS Take by mouth.     mupirocin ointment (BACTROBAN) 2 % Apply 1 application. topically 2 (two) times daily. 22 g 0   OLANZapine (ZYPREXA) 10 MG tablet TAKE 1 TABLET BY MOUTH EVERYDAY AT BEDTIME (Patient taking differently: Take 15 mg by mouth at bedtime. TAKE 1 TABLET BY MOUTH EVERYDAY AT BEDTIME) 90 tablet 0   ondansetron (ZOFRAN) 4 MG tablet TAKE ONE TABLET BY MOUTH TWICE DAILY AS NEEDED FOR NAUSEA AND VOMITING (VIAL) 30 tablet 0   pantoprazole (PROTONIX) 40 MG tablet TAKE ONE TABLET BY MOUTH DAILY AT 9AM 72 tablet 11   potassium chloride SA (KLOR-CON M) 20 MEQ tablet TAKE ONE TABLET BY MOUTH DAILY AS NEEDED TAKE WITH LASIX (VIAL) 90 tablet 11   rosuvastatin (CRESTOR) 40 MG tablet Take 1 tablet (40 mg total) by mouth daily. 90 tablet 1   triamcinolone ointment (KENALOG) 0.5 % APPLY TO AFFECTED AREA TWICE A DAY, for up to a week 30 g 0   lubiprostone (AMITIZA) 8 MCG capsule Take 1 capsule (8 mcg total) by mouth 2 (two) times daily with a meal. (Patient not taking: Reported on 03/05/2022) 180 capsule 2   No current facility-administered medications on file prior to visit.     ROS see history of present illness  Objective  Physical Exam Vitals:   03/05/22 1444  BP: 118/80  Pulse: 95  Temp: 98.5 F (36.9 C)  SpO2: 99%    BP Readings from Last 3 Encounters:  03/05/22 118/80  02/06/22 124/81  01/30/22 122/80   Wt Readings from Last 3 Encounters:  03/05/22 218 lb 12.8 oz (99.2 kg)  02/06/22 220 lb 3.2 oz (99.9 kg)  02/05/22 219 lb (99.3 kg)    Physical Exam Constitutional:      General: She is not in acute distress.    Appearance: She is not diaphoretic.  Cardiovascular:     Rate and Rhythm: Normal rate and regular rhythm.     Heart sounds: Normal heart sounds.  Pulmonary:     Effort: Pulmonary effort is normal.     Breath sounds: Normal breath sounds.  Skin:    General: Skin is warm and dry.  Neurological:     Mental Status: She is alert.     Comments: 5/5  strength in bilateral biceps, triceps, grip, quads, hamstrings, plantar and dorsiflexion, sensation to light touch reduced in her right foot more so on the dorsal aspect of the foot though does have numbness in the other aspects of her foot, the numbness does extend up just above her ankle circumferentially, otherwise sensation to light touch intact in bilateral UE and LE, normal gait      Assessment/Plan: Please see individual problem list.  Problem List  Items Addressed This Visit     Cysts of both ovaries (Chronic)    I will refer to a different GYN for a second opinion on this.       Relevant Orders   Ambulatory referral to Gynecology   Diabetes mellitus without complication ([place]) (Chronic)    Well-controlled on most recent A1c.  Sugar today was elevated.  She will continue Januvia 100 mg daily and Jardiance 25 mg daily.      Relevant Medications   sitaGLIPtin (JANUVIA) 100 MG tablet   empagliflozin (JARDIANCE) 25 MG TABS tablet   IBS (irritable bowel syndrome) (Chronic)    She will see GI for follow-up on this issue.       Numbness of right foot (Chronic)    I suspect this is likely related to nerve impingement from her lumbar spine.  She sees her spine specialist next week and I have asked her to mention this to them.  She will let me know if they think it is not related to her spine.      OAB (overactive bladder)    Urge incontinence issues are likely related to overactive bladder.  Discussed timed voiding.  Discussed that the cheaper medication options would not be a great option for her given her constipation issues.  Discussed that other medication options can be quite expensive.  If timed voiding is not beneficial she will let me know.        Return in about 3 months (around 06/05/2022).   [name], MD [place]

## 2022-03-05 NOTE — Telephone Encounter (Signed)
I called the patient and informed her to call her dentist and have them refax the form for clearance and she took the number and stted she would call them.  Cheryal Salas,cma

## 2022-03-05 NOTE — Assessment & Plan Note (Signed)
I suspect this is likely related to nerve impingement from her lumbar spine.  She sees her spine specialist next week and I have asked her to mention this to them.  She will let me know if they think it is not related to her spine.

## 2022-03-05 NOTE — Assessment & Plan Note (Signed)
I will refer to a different GYN for a second opinion on this.

## 2022-03-05 NOTE — Patient Instructions (Signed)
Nice to see you. Please mention your foot numbness to the spine specialist.  GYN should contact you for a second opinion.

## 2022-03-05 NOTE — Assessment & Plan Note (Signed)
She will see GI for follow-up on this issue.

## 2022-03-05 NOTE — Progress Notes (Signed)
fol

## 2022-03-05 NOTE — Assessment & Plan Note (Signed)
Well-controlled on most recent A1c.  Sugar today was elevated.  She will continue Januvia 100 mg daily and Jardiance 25 mg daily.

## 2022-03-05 NOTE — Telephone Encounter (Signed)
Pt called stating she is still waiting on her clearance form for her dentist. Pt has postponed her ovaries surgery

## 2022-03-11 ENCOUNTER — Telehealth: Payer: Self-pay | Admitting: Family Medicine

## 2022-03-11 DIAGNOSIS — K219 Gastro-esophageal reflux disease without esophagitis: Secondary | ICD-10-CM | POA: Diagnosis not present

## 2022-03-11 DIAGNOSIS — E119 Type 2 diabetes mellitus without complications: Secondary | ICD-10-CM | POA: Diagnosis not present

## 2022-03-11 DIAGNOSIS — E039 Hypothyroidism, unspecified: Secondary | ICD-10-CM | POA: Diagnosis not present

## 2022-03-11 DIAGNOSIS — I509 Heart failure, unspecified: Secondary | ICD-10-CM | POA: Diagnosis not present

## 2022-03-11 NOTE — Telephone Encounter (Signed)
I called to [facility] and asked them to refax the clearance form for the patient I had to leave a message.  Jerome Viglione,cma

## 2022-03-11 NOTE — Telephone Encounter (Signed)
Pt called in wondering if Dr. Caryl Bis received a request from her dentist about a special procedure before taking out all her teeth... Pt is requesting update... Pt requesting callback.Marland KitchenMarland Kitchen

## 2022-03-12 DIAGNOSIS — M48061 Spinal stenosis, lumbar region without neurogenic claudication: Secondary | ICD-10-CM | POA: Diagnosis not present

## 2022-03-12 NOTE — Telephone Encounter (Signed)
I called and LVM informing the patient that we received the form and the provider will fill it out and I will fax it to the office.  Ellinore Merced,cma

## 2022-03-12 NOTE — Telephone Encounter (Signed)
Clearance form reviewed. I created a letter to send to them as there was not enough room on the form to provide the information that I needed to provide. Please fax this to the dentist office.

## 2022-03-12 NOTE — Telephone Encounter (Signed)
I faxed the letter to the dentist, confirmation given.  Chapel Silverthorn,cma

## 2022-03-13 NOTE — Progress Notes (Signed)
Date:  03/14/2022   ID:  Valerie Vazquez, DOB [birth year], MRN [id number]  Patient Location: Home Provider Location: Office/Clinic  PCP:  [name], MD  Cardiologist:  Kate Sable, MD  Electrophysiologist:  None   Evaluation Performed:  Follow-Up Visit  Chief Complaint:  Follow up- Chest pain  History of Present Illness:    Valerie Vazquez is a 66 y.o. female with history of schizoaffective disorder, DM, HTN, diastolic CHF and multiple episodes of PE/DVT now on Eliquis with IVC filter who is hee today for follow up. She was admitted to [facility] in April 2016 with chest pain. Her initial EKG was normal but she had dynamic ST changes when chest pain recurred so a code STEMI was called. Emergent cardiac cath with no evidence of CAD. Troponin was negative. She was started on Diltiazem and Imdur for possible vasospasm.  Admitted to [facility] in January 2018 with chest pain. Nuclear stress test January 2018 with no ischemia. She was seen by primary care August 21, 2017 with chest pain.  Troponin and d-dimer were both negative.  Chest x-ray demonstrated trace bilateral effusions.  She was seen in the emergency room at [facility] 08/26/17 with chest pain.  Troponin was negative.  Chest x-ray demonstrated trace bilateral pleural effusions. Normal nuclear stress test in January 2019. Echo July 2020 with LVEF=60-65%. Nuclear stress test 07/20/20 with no ischemia. She has been followed over the past 3 years in our [place] office by Dr. Garen Lah. She has been on Lasix for LE edema. She remains on Imdur.   She is here today for follow up. The patient denies any dyspnea, palpitations, lower extremity edema, orthopnea, PND, dizziness, near syncope or syncope. She noticed chest pressure earlier this week. This occurred for several minutes over two days. No associated dizziness, nausea or dyspnea.   Past Medical History:  Diagnosis Date   Acute right-sided low back pain with right-sided sciatica  10/23/2017   Anemia    Bilateral renal cysts    CAD (coronary artery disease) 09/28/2016   Chest pain 10/14/2015   Chronic back pain    "upper and lower" (09/19/2014)   Chronic shoulder pain (Location of Primary Source of Pain) (Left) 01/29/2016   Colitis 04/08/2016   Colon polyp 01/30/2017   Coronary vasospasm ([place]) 12/09/2014   Depression    "major" (09/19/2014)   DVT (deep venous thrombosis) (HCC)    Esophageal spasm    Essential hypertension 10/28/2013   Gallstones    Gastritis    Gastroenteritis    GERD (gastroesophageal reflux disease)    Headache    "couple /month lately" (09/19/2014)   Heart disease 12/09/2016   High blood pressure    High cholesterol    History of blood transfusion 1985   related to hysterectomy   History of nuclear stress test    Myoview 1/19:  EF 65, no ischemia or scar   History of pulmonary embolus (PE) 09/19/2014   Hx of cervical spine surgery 01/29/2016   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 Rule out Paget's disease of the bone with blood work.    Hyperlipidemia 10/28/2013   Hypertension    IBS (irritable bowel syndrome)    Lumbar central spinal stenosis (moderate at L4-5; mild at L2-3 and L3-4) 01/29/2016   Migraine    "a few times/yr" (09/19/2014)   Myocardial infarction ([place]) 10/2010 X 3   "while hospitalized"   Neck pain 01/29/2016   Osteoarthritis    "  qwhere; mostly around my joints" (09/19/2014)   Pleural effusion, bilateral 07/31/2015   Pneumonia 04/2014   PTSD (post-traumatic stress disorder)    Pulmonary embolism ([place]) 04/2001; 09/19/2014   "after gallbladder OR; "   Schizoaffective disorder ([place])    Schizoaffective disorder, depressive type ([place]) 08/28/2016   Sleep apnea    "mild" (09/19/2014)   Snoring    a. sleep study 5/16: No OSA   SOB (shortness of breath) 07/31/2015   Spinal stenosis    Spondylosis    Suicide attempt ([place]) 08/27/2016   Type II diabetes mellitus ([place])    "dx'd in 2007; lost weight; no RX for ~ 4  yr now" (09/19/2014)   Wheezing 02/25/2017   Past Surgical History:  Procedure Laterality Date   [place] DECOMP/DISCECTOMY FUSION  10/2012   in [place] C5-C7    [place] Right    BREAST EXCISIONAL BIOPSY Right YRS AGO   NEG   CARDIAC CATHETERIZATION   2000's; 2009   CHOLECYSTECTOMY  2002   COLONOSCOPY WITH PROPOFOL N/A 12/12/2016   Procedure: COLONOSCOPY WITH PROPOFOL;  Surgeon: [name], MD;  Location: [facility] ENDOSCOPY;  Service: Endoscopy;  Laterality: N/A;   COLONOSCOPY WITH PROPOFOL N/A 02/17/2017   Procedure: COLONOSCOPY WITH PROPOFOL;  Surgeon: [name], MD;  Location: [facility] ENDOSCOPY;  Service: Gastroenterology;  Laterality: N/A;   COLONOSCOPY WITH PROPOFOL N/A 11/04/2021   Procedure: COLONOSCOPY WITH PROPOFOL;  Surgeon: [name], MD;  Location: [facility] ENDOSCOPY;  Service: Gastroenterology;  Laterality: N/A;   ESOPHAGOGASTRODUODENOSCOPY (EGD) WITH PROPOFOL N/A 02/17/2017   Procedure: ESOPHAGOGASTRODUODENOSCOPY (EGD) WITH PROPOFOL;  Surgeon: [name], MD;  Location: [facility] ENDOSCOPY;  Service: Gastroenterology;  Laterality: N/A;   ESOPHAGOGASTRODUODENOSCOPY (EGD) WITH PROPOFOL N/A 03/07/2019   Procedure: ESOPHAGOGASTRODUODENOSCOPY (EGD) WITH PROPOFOL;  Surgeon: [name], MD;  Location: [facility] ENDOSCOPY;  Service: Gastroenterology;  Laterality: N/A;   EXCISION/RELEASE BURSA HIP Right 12/1984   HERNIA REPAIR  1985   LEFT HEART CATHETERIZATION WITH CORONARY ANGIOGRAM N/A 12/09/2014   Procedure: LEFT HEART CATHETERIZATION WITH CORONARY ANGIOGRAM;  Surgeon: Burnell Blanks, MD;  Location: [facility] CATH LAB;  Service: Cardiovascular;  Laterality: N/A;   TONSILLECTOMY AND ADENOIDECTOMY  ~ 1968   TOTAL HIP ARTHROPLASTY Left [id number]   UMBILICAL HERNIA REPAIR  1985   VENA CAVA FILTER PLACEMENT  03/2014     Current Meds  Medication Sig   acetaminophen (TYLENOL) 500 MG tablet Take 1 tablet (500 mg  total) by mouth every 6 (six) hours as needed.   baclofen (LIORESAL) 10 MG tablet TAKE ONE TABLET BY MOUTH THREE TIMES DAILY AS NEEDED FOR MUSCLE SPASMS (VIAL)   benzonatate (TESSALON) 100 MG capsule Take 1 capsule (100 mg total) by mouth 3 (three) times daily as needed for cough.   blood glucose meter kit and supplies KIT Dispense based on patient and insurance preference. Use once daily fasting. Dx code E11.9.   Blood Glucose Monitoring Suppl (GHT BLOOD GLUCOSE MONITOR) w/Device KIT DISPENSE BASED ON PATIENT AND INSURANCE PREFERENCE. USE ONCE DAILY AS DIRECTED. DX CODE E11.9.   dicyclomine (BENTYL) 20 MG tablet TAKE ONE TABLET BY MOUTH THREE TIMES DAILY AS NEEDED FOR ABDOMINAL SPASMS (VIAL)   diphenhydrAMINE (BENADRYL) 25 MG tablet Take 1 tablet (25 mg total) by mouth every 6 (six) hours.   ELIQUIS 5 MG TABS tablet TAKE ONE TABLET BY MOUTH TWICE DAILY _0  & 5PM   empagliflozin (  JARDIANCE) 25 MG TABS tablet TAKE ONE TABLET BY MOUTH DAILY AT 9AM   enoxaparin (LOVENOX) 100 MG/ML injection Inject 1 mL (100 mg total) into the skin every 12 (twelve) hours. Take the morning and evening of 02/09/22 and 02/10/22, take in the morning of 02/11/22   escitalopram (LEXAPRO) 20 MG tablet TAKE 1 TABLET BY MOUTH EVERY DAY   estradiol (ESTRACE) 0.1 MG/GM vaginal cream Dispose applicator, use pea size amount 3 times a week at night   famotidine (PEPCID) 20 MG tablet TAKE ONE TABLET BY MOUTH DAILY AT 9PM AT BEDTIME   fluticasone (FLONASE) 50 MCG/ACT nasal spray Place 2 sprays into both nostrils daily.   furosemide (LASIX) 20 MG tablet TAKE ONE TABLET BY MOUTH DAILY AS NEEDED for edema. (VIAL)   gabapentin (NEURONTIN) 600 MG tablet TAKE ONE TABLET BY MOUTH DAILY AT 9AM and TAKE 1 AND 1/2 TABLETS DAILY AT 9PM AT BEDTIME   isosorbide mononitrate (IMDUR) 30 MG 24 hr tablet Take 1 tablet (30 mg total) by mouth daily.   levothyroxine (SYNTHROID) 25 MCG tablet TAKE 1 TABLET BY MOUTH BEFORE BREAKFAST 2-3 HOURS BEFORE TAKING  ANY OTHER MEDICATION OR SUPPLEMENT   lidocaine (LIDODERM) 5 % Place 1 patch onto the skin daily. Remove & Discard patch within 12 hours.   Multiple Vitamins-Minerals (SUPER THERA VITE M) TABS Take by mouth.   mupirocin ointment (BACTROBAN) 2 % Apply 1 application. topically 2 (two) times daily.   nitroGLYCERIN (NITROSTAT) 0.4 MG SL tablet Place 1 tablet (0.4 mg total) under the tongue every 5 (five) minutes as needed for chest pain.   OLANZapine (ZYPREXA) 10 MG tablet TAKE 1 TABLET BY MOUTH EVERYDAY AT BEDTIME (Patient taking differently: Take 15 mg by mouth at bedtime. TAKE 1 TABLET BY MOUTH EVERYDAY AT BEDTIME)   ondansetron (ZOFRAN) 4 MG tablet TAKE ONE TABLET BY MOUTH TWICE DAILY AS NEEDED FOR NAUSEA AND VOMITING (VIAL)   pantoprazole (PROTONIX) 40 MG tablet TAKE ONE TABLET BY MOUTH DAILY AT 9AM   potassium chloride SA (KLOR-CON M) 20 MEQ tablet TAKE ONE TABLET BY MOUTH DAILY AS NEEDED TAKE WITH LASIX (VIAL)   rosuvastatin (CRESTOR) 40 MG tablet Take 1 tablet (40 mg total) by mouth daily.   sitaGLIPtin (JANUVIA) 100 MG tablet TAKE ONE TABLET BY MOUTH DAILY AT 9AM   triamcinolone ointment (KENALOG) 0.5 % APPLY TO AFFECTED AREA TWICE A DAY, for up to a week     Allergies:   Abilify [aripiprazole], Augmentin [amoxicillin-pot clavulanate], Bactrim [sulfamethoxazole-trimethoprim], Ceftin [cefuroxime axetil], Coconut (cocos nucifera), Diclofenac, Dye fdc red [red dye], Indocin [indomethacin], Linaclotide, Lisinopril, Morphine, Morphine and related, Simvastatin, Sulfa antibiotics, Sulfamethoxazole-trimethoprim, Wellbutrin [bupropion], Zetia [ezetimibe], Quetiapine fumarate, and Quetiapine fumarate   Social History   Tobacco Use   Smoking status: Former    Packs/day: 1.50    Years: 10.00    Total pack years: 15.00    Types: Cigarettes    Quit date: 04/12/1979    Years since quitting: 42.9   Smokeless tobacco: Never  Vaping Use   Vaping Use: Never used  Substance Use Topics   Alcohol use: No     Alcohol/week: 0.0 standard drinks of alcohol   Drug use: No     Family Hx: The patient's family history includes Cancer in her brother; Healthy in her daughter and son; Lung cancer in her father; Other in her son; Stroke in her mother. There is no history of Heart attack.  ROS:   Please see the history of present illness.  All other systems reviewed and are negative.   Labs/Other Tests and Data Reviewed:    EKG:  EKG is not performed today. I have personally reviewed the EKG and is shows:   Recent Labs: 08/27/2021: Magnesium 1.7; TSH 2.47 09/24/2021: BUN 7; Creatinine, Ser 0.89; Hemoglobin 13.1; Platelets 301.0; Potassium 3.9; Sodium 140 01/23/2022: ALT 21   Recent Lipid Panel Lab Results  Component Value Date/Time   CHOL 194 11/27/2021 10:32 AM   TRIG 105.0 11/27/2021 10:32 AM   HDL 54.00 11/27/2021 10:32 AM   CHOLHDL 4 11/27/2021 10:32 AM   LDLCALC 119 (H) 11/27/2021 10:32 AM   LDLDIRECT 102.0 01/23/2022 09:05 AM    Wt Readings from Last 3 Encounters:  03/14/22 221 lb 12.8 oz (100.6 kg)  03/05/22 218 lb 12.8 oz (99.2 kg)  02/06/22 220 lb 3.2 oz (99.9 kg)     Objective:    Vital Signs:  BP 100/60   Pulse 80   Ht 5' 6" (1.676 m)   Wt 221 lb 12.8 oz (100.6 kg)   SpO2 96%   BMI 35.80 kg/m    General: Well developed, well nourished, NAD  HEENT: OP clear, mucus membranes moist  SKIN: warm, dry. No rashes. Neuro: No focal deficits  Musculoskeletal: Muscle strength 5/5 all ext  Psychiatric: Mood and affect normal  Neck: No JVD, no carotid bruits, no thyromegaly, no lymphadenopathy.  Lungs:Clear bilaterally, no wheezes, rhonci, crackles Cardiovascular: Regular rate and rhythm. No murmurs, gallops or rubs. Abdomen:Soft. Bowel sounds present. Non-tender.  Extremities: No lower extremity edema. Pulses are 2 + in the bilateral DP/PT.  ASSESSMENT & PLAN:      1. Coronary vasospasm: Atypical chest pain. No CAD by cath in April 2016. Possible coronary vasospasm.  Normal stress test in January 2019 and again November 2021. LV systolic function normal by echo in July 2020.  Will continue Imdur. Will add SL NTG to use as needed for possible coronary vasospasm.    2. HTN: BP is well controlled. No changes   3. HLD: Followed in primary care. Continue statin.    4. History of Pulmonary embolism: This is managed by primary care. Continue Eliquis.   5. Chronic diastolic CHF: Weight is stable. No volume overload on exam. Continue Lasix as needed.   Tests Ordered: No orders of the defined types were placed in this encounter.  Disposition:  Follow up with Dr. Garen Lah in 6 months.    Signed, Lauree Chandler, MD  03/14/2022 10:29 AM    [place]

## 2022-03-14 ENCOUNTER — Encounter: Payer: Self-pay | Admitting: Cardiovascular Disease

## 2022-03-14 ENCOUNTER — Ambulatory Visit (INDEPENDENT_AMBULATORY_CARE_PROVIDER_SITE_OTHER): Payer: Medicare Other | Admitting: Cardiovascular Disease

## 2022-03-14 VITALS — BP 100/60 | HR 80 | Ht 66.0 in | Wt 221.8 lb

## 2022-03-14 DIAGNOSIS — E7800 Pure hypercholesterolemia, unspecified: Secondary | ICD-10-CM

## 2022-03-14 DIAGNOSIS — E785 Hyperlipidemia, unspecified: Secondary | ICD-10-CM | POA: Diagnosis not present

## 2022-03-14 DIAGNOSIS — I10 Essential (primary) hypertension: Secondary | ICD-10-CM | POA: Diagnosis not present

## 2022-03-14 DIAGNOSIS — E039 Hypothyroidism, unspecified: Secondary | ICD-10-CM | POA: Diagnosis not present

## 2022-03-14 DIAGNOSIS — I201 Angina pectoris with documented spasm: Secondary | ICD-10-CM | POA: Diagnosis not present

## 2022-03-14 DIAGNOSIS — E119 Type 2 diabetes mellitus without complications: Secondary | ICD-10-CM | POA: Diagnosis not present

## 2022-03-14 DIAGNOSIS — F32A Depression, unspecified: Secondary | ICD-10-CM | POA: Diagnosis not present

## 2022-03-14 MED ORDER — NITROGLYCERIN 0.4 MG SL SUBL: 0.4000 mg | SUBLINGUAL_TABLET | SUBLINGUAL | 3 refills | Status: DC | PRN

## 2022-03-14 NOTE — Patient Instructions (Signed)
Medication Instructions:  Nitroglycerin tablets 0.4 mg under your tongue (sublingual)   If a single episode of chest pain is not relieved by one tablet, the patient will try another within 5 minutes; and if this doesn't relieve the pain, the patient will try another within 5 minutes and if this doesn't relieve the pain the patient is instructed to call 911 for transportation to an emergency department.  *If you need a refill on your cardiac medications before your next appointment, please call your pharmacy*   Lab Work: none If you have labs (blood work) drawn today and your tests are completely normal, you will receive your results only by: [place] (if you have MyChart) OR A paper copy in the mail If you have any lab test that is abnormal or we need to change your treatment, we will call you to review the results.   Testing/Procedures: none   Follow-Up: At [facility], you and your health needs are our priority.  As part of our continuing mission to provide you with exceptional heart care, we have created designated Provider Care Teams.  These Care Teams include your primary Cardiologist (physician) and Advanced Practice Providers (APPs -  Physician Assistants and Nurse Practitioners) who all work together to provide you with the care you need, when you need it.  We recommend signing up for the patient portal called "MyChart".  Sign up information is provided on this After Visit Summary.  MyChart is used to connect with patients for Virtual Visits (Telemedicine).  Patients are able to view lab/test results, encounter notes, upcoming appointments, etc.  Non-urgent messages can be sent to your provider as well.   To learn more about what you can do with MyChart, go to [email or web address].    Your next appointment:   6 month(s)  The format for your next appointment:   In Person  Provider:   You may see Kate Sable, MD or one of the following Advanced Practice  Providers on your designated Care Team:   Murray Hodgkins, NP Christell Faith, PA-C Cadence Kathlen Mody, [place]    Other Instructions   Important Information About Sugar

## 2022-03-17 ENCOUNTER — Other Ambulatory Visit (INDEPENDENT_AMBULATORY_CARE_PROVIDER_SITE_OTHER): Payer: Medicare Other

## 2022-03-17 ENCOUNTER — Other Ambulatory Visit: Payer: Self-pay | Admitting: Family Medicine

## 2022-03-17 ENCOUNTER — Other Ambulatory Visit: Payer: Medicare Other

## 2022-03-17 DIAGNOSIS — E785 Hyperlipidemia, unspecified: Secondary | ICD-10-CM

## 2022-03-17 DIAGNOSIS — R110 Nausea: Secondary | ICD-10-CM

## 2022-03-18 ENCOUNTER — Other Ambulatory Visit: Payer: Self-pay | Admitting: Family Medicine

## 2022-03-18 DIAGNOSIS — R7989 Other specified abnormal findings of blood chemistry: Secondary | ICD-10-CM

## 2022-03-18 LAB — HEPATIC FUNCTION PANEL
ALT: 45 U/L — ABNORMAL HIGH (ref 0–35)
AST: 31 U/L (ref 0–37)
Albumin: 3.9 g/dL (ref 3.5–5.2)
Alkaline Phosphatase: 95 U/L (ref 39–117)
Bilirubin, Direct: 0.1 mg/dL (ref 0.0–0.3)
Total Bilirubin: 0.4 mg/dL (ref 0.2–1.2)
Total Protein: 6.4 g/dL (ref 6.0–8.3)

## 2022-03-18 LAB — LDL CHOLESTEROL, DIRECT: Direct LDL: 108 mg/dL

## 2022-03-19 ENCOUNTER — Telehealth: Payer: Self-pay | Admitting: Family Medicine

## 2022-03-19 NOTE — Telephone Encounter (Signed)
Pt called stating that she would like to talk to the provider about some test she had on Tuesday. Pt stated one of the test are high

## 2022-03-20 ENCOUNTER — Telehealth: Payer: Self-pay | Admitting: Cardiovascular Disease

## 2022-03-20 ENCOUNTER — Telehealth: Payer: Self-pay | Admitting: Family Medicine

## 2022-03-20 NOTE — Telephone Encounter (Signed)
Pt called to follow up on a clearance form that was faxed over by the [facility] - Dr. Glennon Mac. Please advise.

## 2022-03-20 NOTE — Telephone Encounter (Signed)
Returned patient's call to inform her that we have not received a clearance form from [facility] yet. I told the patient that I will give her call and let her know when we have received it. Pt verbalized understanding and thanked me for the call.

## 2022-03-20 NOTE — Telephone Encounter (Signed)
Pt called wanting to know if the provider has received the pre-op approval form from Dr. Delila Spence

## 2022-03-20 NOTE — Telephone Encounter (Signed)
Spoke to patient about her ALT result and stated that Dr Caryl Bis would like to do a recheck in 3-4 weeks  patient verbalized understanding and We got her scheduled for lab on 04/08/22

## 2022-03-25 NOTE — Telephone Encounter (Signed)
I called and lvm informing the patient that we did received the preop form.  Valerie Vazquez,cma

## 2022-03-26 ENCOUNTER — Telehealth: Payer: Self-pay

## 2022-03-26 NOTE — Telephone Encounter (Signed)
called and spoke with patient, see results note.  Letitia Sabala,cma

## 2022-03-26 NOTE — Telephone Encounter (Signed)
Patient states she is returning Fulton Mole, CMA's call regarding results of her blood work.

## 2022-03-27 ENCOUNTER — Other Ambulatory Visit: Payer: Self-pay

## 2022-03-27 DIAGNOSIS — E785 Hyperlipidemia, unspecified: Secondary | ICD-10-CM

## 2022-03-27 MED ORDER — EZETIMIBE 10 MG PO TABS: 10.0000 mg | ORAL_TABLET | Freq: Every day | ORAL | 1 refills | Status: DC

## 2022-03-28 ENCOUNTER — Other Ambulatory Visit: Payer: Self-pay | Admitting: Family Medicine

## 2022-03-28 ENCOUNTER — Encounter: Payer: Self-pay | Admitting: Family Medicine

## 2022-03-28 ENCOUNTER — Encounter: Payer: Self-pay | Admitting: *Deleted

## 2022-03-28 ENCOUNTER — Ambulatory Visit (INDEPENDENT_AMBULATORY_CARE_PROVIDER_SITE_OTHER): Payer: Medicare Other | Admitting: Family Medicine

## 2022-03-28 DIAGNOSIS — R109 Unspecified abdominal pain: Secondary | ICD-10-CM

## 2022-03-28 DIAGNOSIS — N179 Acute kidney failure, unspecified: Secondary | ICD-10-CM

## 2022-03-28 LAB — COMPREHENSIVE METABOLIC PANEL
ALT: 37 U/L — ABNORMAL HIGH (ref 0–35)
AST: 31 U/L (ref 0–37)
Albumin: 4.1 g/dL (ref 3.5–5.2)
Alkaline Phosphatase: 104 U/L (ref 39–117)
BUN: 9 mg/dL (ref 6–23)
CO2: 25 mEq/L (ref 19–32)
Calcium: 9.4 mg/dL (ref 8.4–10.5)
Chloride: 107 mEq/L (ref 96–112)
Creatinine, Ser: 1.05 mg/dL (ref 0.40–1.20)
GFR: 54.8 mL/min — ABNORMAL LOW (ref >60.00)
Glucose, Bld: 154 mg/dL — ABNORMAL HIGH (ref 70–99)
Potassium: 3.7 mEq/L (ref 3.5–5.1)
Sodium: 141 mEq/L (ref 135–145)
Total Bilirubin: 0.3 mg/dL (ref 0.2–1.2)
Total Protein: 7.4 g/dL (ref 6.0–8.3)

## 2022-03-28 LAB — CBC WITH DIFFERENTIAL/PLATELET
Basophils Absolute: 0 10*3/uL (ref 0.0–0.1)
Basophils Relative: 0.3 % (ref 0.0–3.0)
Eosinophils Absolute: 0.2 10*3/uL (ref 0.0–0.7)
Eosinophils Relative: 2 % (ref 0.0–5.0)
HCT: 38.4 % (ref 36.0–46.0)
Hemoglobin: 12.7 g/dL (ref 12.0–15.0)
Lymphocytes Relative: 26.9 % (ref 12.0–46.0)
Lymphs Abs: 2.3 10*3/uL (ref 0.7–4.0)
MCHC: 33 g/dL (ref 30.0–36.0)
MCV: 97.2 fl (ref 78.0–100.0)
Monocytes Absolute: 0.4 10*3/uL (ref 0.1–1.0)
Monocytes Relative: 5.1 % (ref 3.0–12.0)
Neutro Abs: 5.7 10*3/uL (ref 1.4–7.7)
Neutrophils Relative %: 65.7 % (ref 43.0–77.0)
Platelets: 297 10*3/uL (ref 150.0–400.0)
RBC: 3.96 Mil/uL (ref 3.87–5.11)
RDW: 13.3 % (ref 11.5–15.5)
WBC: 8.7 10*3/uL (ref 4.0–10.5)

## 2022-03-28 NOTE — Telephone Encounter (Signed)
Several attempts to retrieve surgical clearance form / information, not successful. Pt has been made aware and will close this encounter until form received.

## 2022-03-28 NOTE — Patient Instructions (Signed)
Nice to see you. We will get labs today and contact you with the results. If you have worsening symptoms or persistent abdominal pain or you develop fevers or blood in your stool please seek medical attention immediately.

## 2022-03-28 NOTE — Progress Notes (Signed)
[name], MD Phone: [phone]  Valerie Vazquez is a 66 y.o. female who presents today for same-day visit.  Abdominal pain: Patient notes onset of symptoms last night.  She has intense bursts of pain that occur intermittently near her bellybutton.  Notes there is some slight tenderness in this area.  She had some nausea with this.  No vomiting.  No change to chronic loose stools.  No blood in her stool.  No dysuria.  No change to chronic urinary frequency or urgency.  No vaginal discharge changes.  No vaginal bleeding.  No fevers.  She does report a history of a hernia in the past.  Social History   Tobacco Use  Smoking Status Former   Packs/day: 1.50   Years: 10.00   Total pack years: 15.00   Types: Cigarettes   Quit date: 04/12/1979   Years since quitting: 42.9  Smokeless Tobacco Never    Current Outpatient Medications on File Prior to Visit  Medication Sig Dispense Refill   acetaminophen (TYLENOL) 500 MG tablet Take 1 tablet (500 mg total) by mouth every 6 (six) hours as needed. 30 tablet 0   baclofen (LIORESAL) 10 MG tablet TAKE ONE TABLET BY MOUTH THREE TIMES DAILY AS NEEDED FOR MUSCLE SPASMS (VIAL) 60 tablet 1   benzonatate (TESSALON) 100 MG capsule Take 1 capsule (100 mg total) by mouth 3 (three) times daily as needed for cough. 21 capsule 0   blood glucose meter kit and supplies KIT Dispense based on patient and insurance preference. Use once daily fasting. Dx code E11.9. 1 each 0   Blood Glucose Monitoring Suppl (GHT BLOOD GLUCOSE MONITOR) w/Device KIT DISPENSE BASED ON PATIENT AND INSURANCE PREFERENCE. USE ONCE DAILY AS DIRECTED. DX CODE E11.9. 1 kit 0   dicyclomine (BENTYL) 20 MG tablet TAKE ONE TABLET BY MOUTH THREE TIMES DAILY AS NEEDED FOR ABDOMINAL SPASMS (VIAL) 270 tablet 11   diphenhydrAMINE (BENADRYL) 25 MG tablet Take 1 tablet (25 mg total) by mouth every 6 (six) hours. 12 tablet 0   ELIQUIS 5 MG TABS tablet TAKE ONE TABLET BY MOUTH TWICE DAILY $RemoveBef'@9AM'mMUYyszGDY$  & 5PM 60  tablet 11   enoxaparin (LOVENOX) 100 MG/ML injection Inject 1 mL (100 mg total) into the skin every 12 (twelve) hours. Take the morning and evening of 02/09/22 and 02/10/22, take in the morning of 02/11/22 5 mL 0   escitalopram (LEXAPRO) 20 MG tablet TAKE 1 TABLET BY MOUTH EVERY DAY 90 tablet 1   estradiol (ESTRACE) 0.1 MG/GM vaginal cream Dispose applicator, use pea size amount 3 times a week at night 42.5 g 12   ezetimibe (ZETIA) 10 MG tablet Take 1 tablet (10 mg total) by mouth daily. 90 tablet 1   famotidine (PEPCID) 20 MG tablet TAKE ONE TABLET BY MOUTH DAILY AT 9PM AT BEDTIME 90 tablet 11   fluticasone (FLONASE) 50 MCG/ACT nasal spray Place 2 sprays into both nostrils daily. 16 g 6   furosemide (LASIX) 20 MG tablet TAKE ONE TABLET BY MOUTH DAILY AS NEEDED for edema. (VIAL) 90 tablet 11   gabapentin (NEURONTIN) 600 MG tablet TAKE ONE TABLET BY MOUTH DAILY AT 9AM and TAKE 1 AND 1/2 TABLETS DAILY AT 9PM AT BEDTIME 225 tablet 11   isosorbide mononitrate (IMDUR) 30 MG 24 hr tablet Take 1 tablet (30 mg total) by mouth daily. 90 tablet 2   JARDIANCE 25 MG TABS tablet TAKE ONE TABLET BY MOUTH DAILY AT 9AM 90 tablet 1   levothyroxine (SYNTHROID) 25 MCG  tablet TAKE 1 TABLET BY MOUTH BEFORE BREAKFAST 2-3 HOURS BEFORE TAKING ANY OTHER MEDICATION OR SUPPLEMENT 90 tablet 3   lidocaine (LIDODERM) 5 % Place 1 patch onto the skin daily. Remove & Discard patch within 12 hours. 30 patch 0   lubiprostone (AMITIZA) 8 MCG capsule Take 1 capsule (8 mcg total) by mouth 2 (two) times daily with a meal. 180 capsule 2   Multiple Vitamins-Minerals (SUPER THERA VITE M) TABS Take by mouth.     mupirocin ointment (BACTROBAN) 2 % Apply 1 application. topically 2 (two) times daily. 22 g 0   nitroGLYCERIN (NITROSTAT) 0.4 MG SL tablet Place 1 tablet (0.4 mg total) under the tongue every 5 (five) minutes as needed for chest pain. 25 tablet 3   OLANZapine (ZYPREXA) 10 MG tablet TAKE 1 TABLET BY MOUTH EVERYDAY AT BEDTIME (Patient  taking differently: Take 15 mg by mouth at bedtime. TAKE 1 TABLET BY MOUTH EVERYDAY AT BEDTIME) 90 tablet 0   ondansetron (ZOFRAN) 4 MG tablet TAKE ONE TABLET BY MOUTH TWICE DAILY AS NEEDED FOR NAUSEA AND VOMITING (VIAL) 30 tablet 1   pantoprazole (PROTONIX) 40 MG tablet TAKE ONE TABLET BY MOUTH DAILY AT 9AM 72 tablet 11   potassium chloride SA (KLOR-CON M) 20 MEQ tablet TAKE ONE TABLET BY MOUTH DAILY AS NEEDED TAKE WITH LASIX (VIAL) 90 tablet 11   rosuvastatin (CRESTOR) 40 MG tablet Take 1 tablet (40 mg total) by mouth daily. 90 tablet 1   sitaGLIPtin (JANUVIA) 100 MG tablet TAKE ONE TABLET BY MOUTH DAILY AT 9AM 90 tablet 1   triamcinolone ointment (KENALOG) 0.5 % APPLY TO AFFECTED AREA TWICE A DAY, for up to a week 30 g 0   No current facility-administered medications on file prior to visit.     ROS see history of present illness  Objective  Physical Exam Vitals:   03/28/22 1313  BP: 120/78  Pulse: (!) 102  Temp: 99.1 F (37.3 C)  SpO2: 95%    BP Readings from Last 3 Encounters:  03/28/22 120/78  03/14/22 100/60  03/05/22 118/80   Wt Readings from Last 3 Encounters:  03/28/22 218 lb 3.2 oz (99 kg)  03/14/22 221 lb 12.8 oz (100.6 kg)  03/05/22 218 lb 12.8 oz (99.2 kg)    Physical Exam Abdominal:     General: Bowel sounds are normal. There is no distension.     Palpations: Abdomen is soft.     Tenderness: There is no abdominal tenderness.     Hernia: No hernia is present.      Assessment/Plan: Please see individual problem list.  Problem List Items Addressed This Visit     Abdominal pain    Recent onset.  Possibly related to hernia though there is not one felt on exam. Could be muscular strain or spasm. We will get lab work to evaluate for underlying infection.  Discussed if it persists over the next couple of days we can get a CT scan.  Advised to seek medical attention for persistent abdominal pain, blood in her stool, fevers, or any worsening symptoms.       Relevant Orders   Comp Met (CMET)   CBC w/Diff     Return if symptoms worsen or fail to improve.   [name], MD [place]

## 2022-03-28 NOTE — Assessment & Plan Note (Addendum)
Recent onset.  Possibly related to hernia though there is not one felt on exam. Could be muscular strain or spasm. We will get lab work to evaluate for underlying infection.  Discussed if it persists over the next couple of days we can get a CT scan.  Advised to seek medical attention for persistent abdominal pain, blood in her stool, fevers, or any worsening symptoms.

## 2022-03-31 DIAGNOSIS — E039 Hypothyroidism, unspecified: Secondary | ICD-10-CM | POA: Diagnosis not present

## 2022-03-31 DIAGNOSIS — E785 Hyperlipidemia, unspecified: Secondary | ICD-10-CM | POA: Diagnosis not present

## 2022-03-31 DIAGNOSIS — F32A Depression, unspecified: Secondary | ICD-10-CM | POA: Diagnosis not present

## 2022-03-31 DIAGNOSIS — E119 Type 2 diabetes mellitus without complications: Secondary | ICD-10-CM | POA: Diagnosis not present

## 2022-04-02 ENCOUNTER — Encounter: Payer: Self-pay | Admitting: Emergency Medicine

## 2022-04-02 ENCOUNTER — Telehealth: Payer: Self-pay | Admitting: Family Medicine

## 2022-04-02 ENCOUNTER — Emergency Department
Admission: EM | Admit: 2022-04-02 | Discharge: 2022-04-02 | Disposition: A | Discharge status: Home / Self Care | Payer: Medicare Other | Attending: Emergency Medicine | Admitting: Emergency Medicine

## 2022-04-02 ENCOUNTER — Other Ambulatory Visit: Payer: Self-pay

## 2022-04-02 DIAGNOSIS — B9689 Other specified bacterial agents as the cause of diseases classified elsewhere: Secondary | ICD-10-CM | POA: Insufficient documentation

## 2022-04-02 DIAGNOSIS — H1033 Unspecified acute conjunctivitis, bilateral: Secondary | ICD-10-CM | POA: Insufficient documentation

## 2022-04-02 DIAGNOSIS — H5789 Other specified disorders of eye and adnexa: Secondary | ICD-10-CM | POA: Diagnosis present

## 2022-04-02 DIAGNOSIS — H1030 Unspecified acute conjunctivitis, unspecified eye: Secondary | ICD-10-CM

## 2022-04-02 MED ORDER — ERYTHROMYCIN 5 MG/GM OP OINT
TOPICAL_OINTMENT | OPHTHALMIC | 0 refills | Status: DC
Start: 2022-04-02 — End: 2022-05-07

## 2022-04-02 NOTE — Telephone Encounter (Addendum)
Pt called stating her right eye is woozy and blood red. Pt stated she need medication for it. I asked her was it a new medication she wanted called in she stated no and pt could not tell me what the name of the medication it was. Pt would like to be called. Sent to access nurse

## 2022-04-02 NOTE — Discharge Instructions (Signed)
Follow-up with your eye doctor if not improving 3 days.  Return for worsening

## 2022-04-02 NOTE — ED Triage Notes (Signed)
Patient has redness and pain to bilateral eyes.

## 2022-04-02 NOTE — ED Provider Notes (Signed)
   [facility] Provider Note    Event Date/Time   First MD Initiated Contact with Patient 04/02/22 1253     (approximate)   History   Eye Pain   HPI  Valerie Vazquez is a 66 y.o. female complains of bilateral eye irritation and redness.  Right eye worse than the left.  States crusted and matted.  Some blurred vision.  No fever or chills      Physical Exam   Triage Vital Signs: ED Triage Vitals  Enc Vitals Group     BP 04/02/22 1251 135/79     Pulse Rate 04/02/22 1251 77     Resp 04/02/22 1251 16     Temp 04/02/22 1251 99.3 F (37.4 C)     Temp src --      SpO2 04/02/22 1251 96 %     Weight 04/02/22 1253 218 lb 3.2 oz (99 kg)     Height 04/02/22 1253 '5\' 6"'$  (1.676 m)     Head Circumference --      Peak Flow --      Pain Score 04/02/22 1252 4     Pain Loc --      Pain Edu? --      Excl. in [place]? --     Most recent vital signs: Vitals:   04/02/22 1251  BP: 135/79  Pulse: 77  Resp: 16  Temp: 99.3 F (37.4 C)  SpO2: 96%     General: Awake, no distress.   CV:  Good peripheral perfusion. regular rate and  rhythm Resp:  Normal effort.  Abd:  No distention.   Other:  Right eye with severely injected conjunctiva, no drainage noted, left eye is slightly irritated   ED Results / Procedures / Treatments   Labs (all labs ordered are listed, but only abnormal results are displayed) Labs Reviewed - No data to display   EKG     RADIOLOGY     PROCEDURES:   Procedures   MEDICATIONS ORDERED IN ED: Medications - No data to display   IMPRESSION / MDM / [place] / ED COURSE  I reviewed the triage vital signs and the nursing notes.                              Differential diagnosis includes, but is not limited to, acute conjunctivitis, preseptal cellulitis, periorbital cellulitis  Patient's presentation is most consistent with acute, uncomplicated illness.   Patient's exam is consistent with acute  conjunctivitis.  She is placed on erythromycin ophthalmic ointment.  She is to follow-up with [facility] her regular doctor if not improving in 3 days.  Return if worsening.  In agreement treatment plan.  Discharged stable condition.      FINAL CLINICAL IMPRESSION(S) / ED DIAGNOSES   Final diagnoses:  Acute bacterial conjunctivitis, unspecified laterality     Rx / DC Orders   ED Discharge Orders          Ordered    erythromycin ophthalmic ointment        04/02/22 1255             Note:  This document was prepared using Dragon voice recognition software and may include unintentional dictation errors.    Versie Starks, PA-C 04/02/22 1258    Carrie Mew, MD 04/02/22 1925

## 2022-04-03 ENCOUNTER — Encounter: Payer: Self-pay | Admitting: Family Medicine

## 2022-04-03 DIAGNOSIS — R7989 Other specified abnormal findings of blood chemistry: Secondary | ICD-10-CM

## 2022-04-03 DIAGNOSIS — E039 Hypothyroidism, unspecified: Secondary | ICD-10-CM

## 2022-04-04 ENCOUNTER — Telehealth: Payer: Self-pay | Admitting: Family Medicine

## 2022-04-04 NOTE — Telephone Encounter (Signed)
Noted. She needs to be seen again today in person for those symptoms. Urgent care or ED for someone to check her vision and do an exam to see if she needs different treatment.

## 2022-04-04 NOTE — Telephone Encounter (Signed)
I called and informed the patient that she needed to be seen today for her vision at the ER or urgent care and she understood and stated she did not want to but she would try to go, I emphasized how important it was for her to be seen because of her vision.  Nian,cma

## 2022-04-04 NOTE — Telephone Encounter (Signed)
Please call her and see what symptoms she is having now. Is she having an eye pain? Any blurry vision? Does light bother her eyes? Any other symptoms.

## 2022-04-08 ENCOUNTER — Other Ambulatory Visit: Payer: Medicare Other

## 2022-04-08 ENCOUNTER — Telehealth: Payer: Self-pay | Admitting: Family Medicine

## 2022-04-08 NOTE — Telephone Encounter (Signed)
I ordered the labs that she requested. She really needs to see the eye doctor to determine if imaging is really needed. Did she get reevaluated last week as I advised?

## 2022-04-08 NOTE — Telephone Encounter (Signed)
Pt called in requesting for a pre-op plan from Dr. Caryl Bis for her OBGYN... Pt requesting callback

## 2022-04-09 IMAGING — CR DG CHEST 2V
1 series · 2 of 2 positions shown · non-contrast
Comparison: July 17, 2018

CLINICAL DATA: Left-sided chest discomfort.

EXAM:
CHEST - 2 VIEW

[Series 1: dg chest 2 view · 0.14mm/px · 2 of 2 slices shown]
[im 1/2]
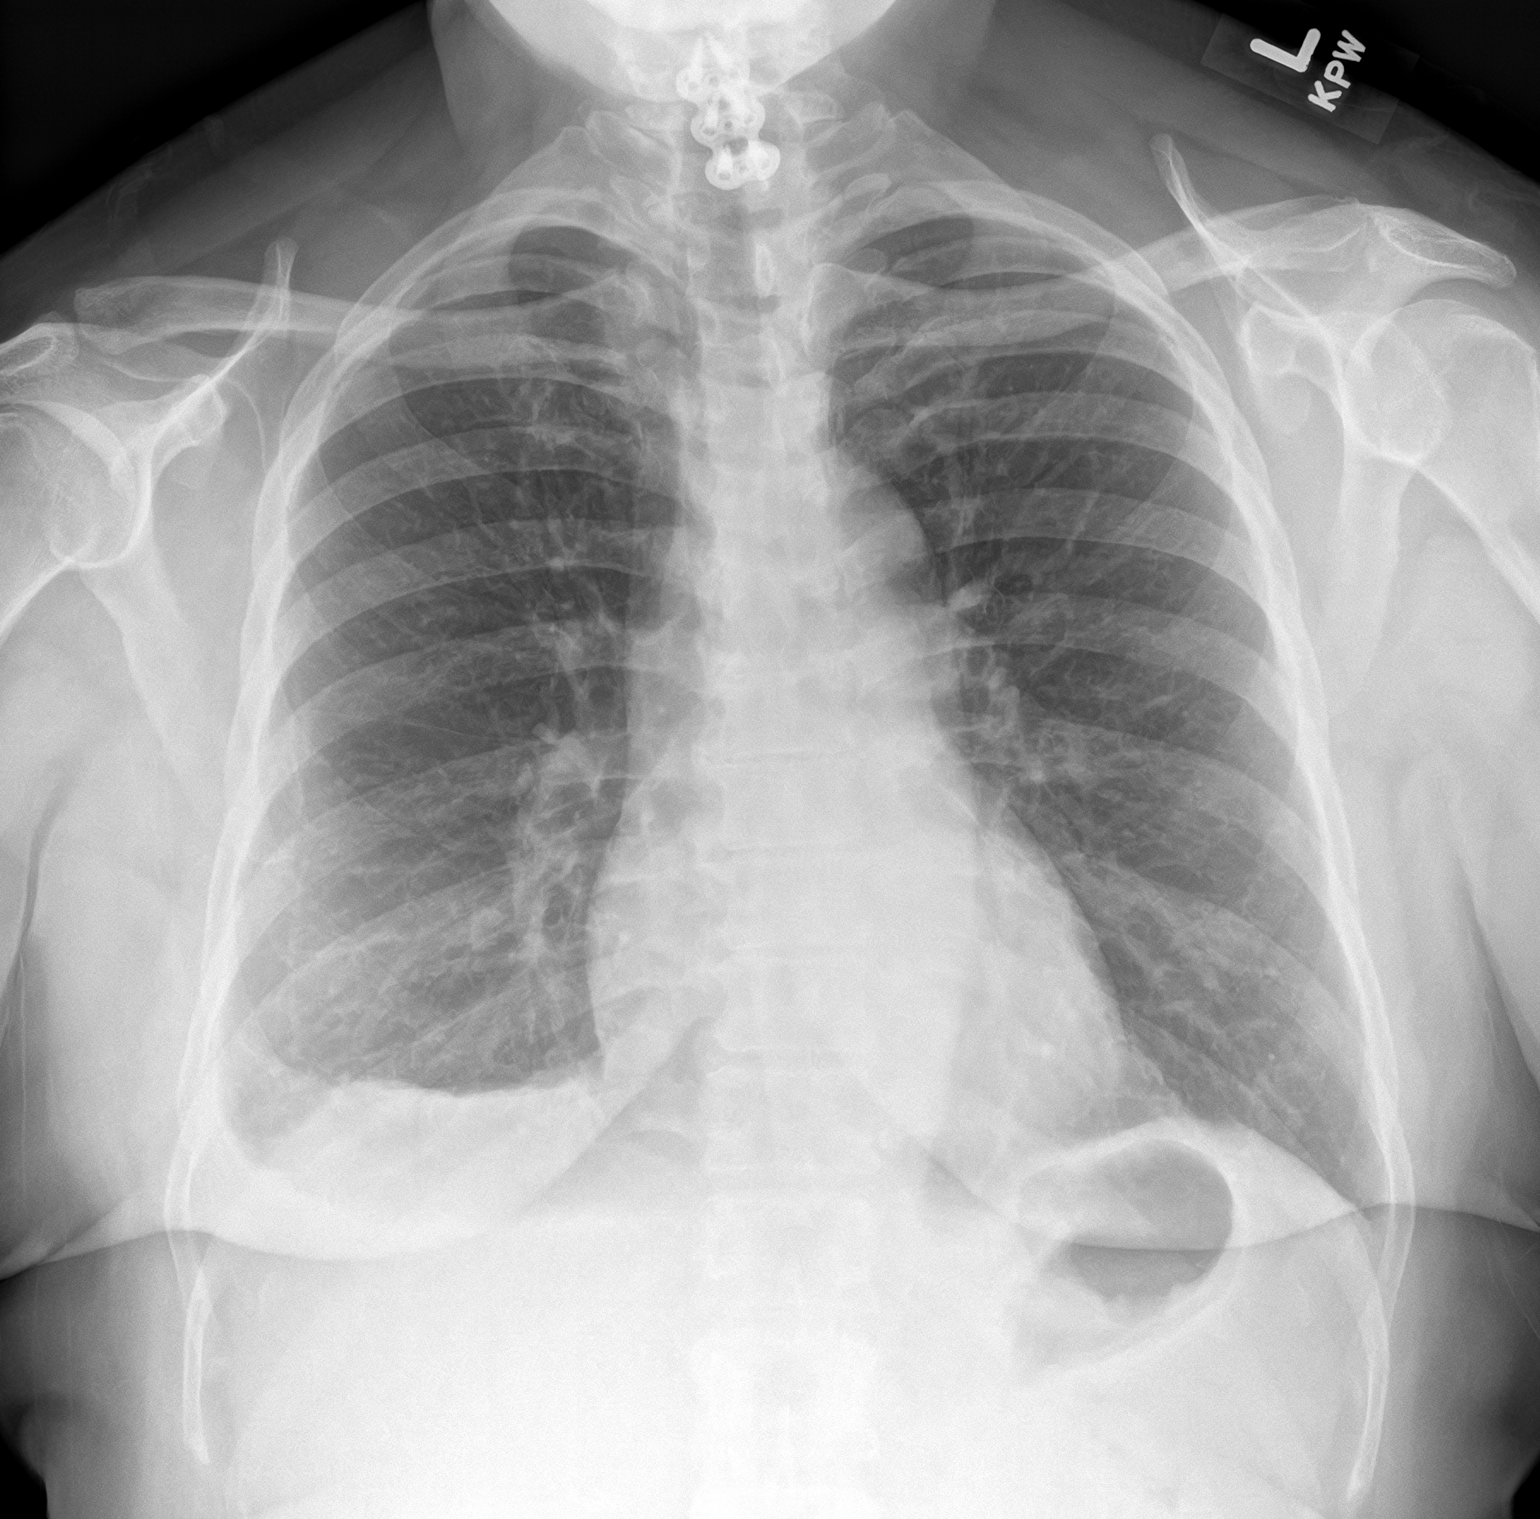
[im 2/2]
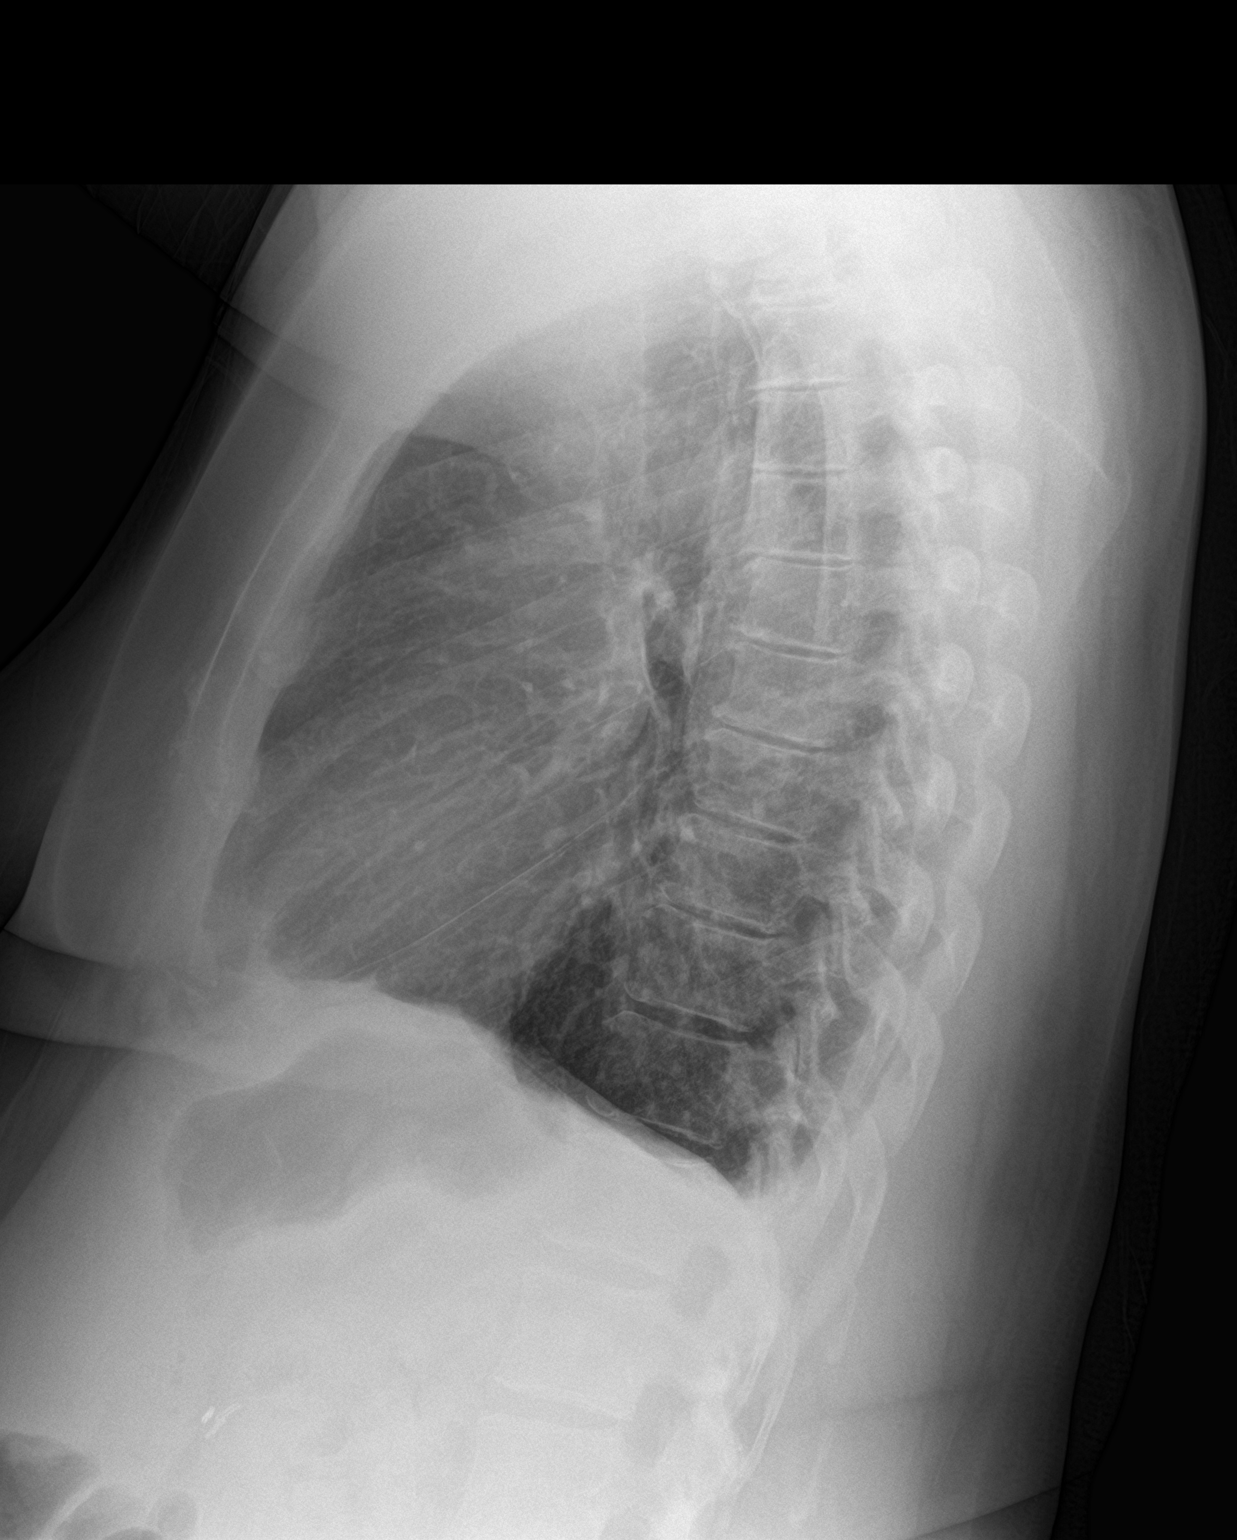

[2 of 2 positions shown; findings below may reference images not displayed]

FINDINGS: Mild, diffuse, chronic appearing increased interstitial lung
markings are seen. There is no evidence of acute infiltrate. A small
right pleural effusion is noted. No pneumothorax is identified. The
heart size and mediastinal contours are within normal limits. A
radiopaque fusion plate and screws are seen overlying the lower
cervical spine. Radiopaque surgical clips are seen within the right
upper quadrant.
IMPRESSION: 1. Small right pleural effusion.

## 2022-04-10 NOTE — Telephone Encounter (Signed)
Please find out when her surgery is scheduled for.  I need to know a potential surgical date before I can give directions on bridging with Lovenox.

## 2022-04-11 ENCOUNTER — Encounter: Payer: Self-pay | Admitting: Family Medicine

## 2022-04-11 ENCOUNTER — Ambulatory Visit (INDEPENDENT_AMBULATORY_CARE_PROVIDER_SITE_OTHER): Payer: Medicare Other | Admitting: Family Medicine

## 2022-04-11 DIAGNOSIS — N179 Acute kidney failure, unspecified: Secondary | ICD-10-CM | POA: Diagnosis not present

## 2022-04-11 DIAGNOSIS — H109 Unspecified conjunctivitis: Secondary | ICD-10-CM | POA: Insufficient documentation

## 2022-04-11 DIAGNOSIS — E039 Hypothyroidism, unspecified: Secondary | ICD-10-CM

## 2022-04-11 DIAGNOSIS — R7989 Other specified abnormal findings of blood chemistry: Secondary | ICD-10-CM

## 2022-04-11 LAB — HEPATIC FUNCTION PANEL
ALT: 61 U/L — ABNORMAL HIGH (ref 0–35)
AST: 41 U/L — ABNORMAL HIGH (ref 0–37)
Albumin: 4 g/dL (ref 3.5–5.2)
Alkaline Phosphatase: 120 U/L — ABNORMAL HIGH (ref 39–117)
Bilirubin, Direct: 0.1 mg/dL (ref 0.0–0.3)
Total Bilirubin: 0.4 mg/dL (ref 0.2–1.2)
Total Protein: 6.7 g/dL (ref 6.0–8.3)

## 2022-04-11 LAB — BASIC METABOLIC PANEL
BUN: 7 mg/dL (ref 6–23)
CO2: 27 mEq/L (ref 19–32)
Calcium: 9.4 mg/dL (ref 8.4–10.5)
Chloride: 107 mEq/L (ref 96–112)
Creatinine, Ser: 0.92 mg/dL (ref 0.40–1.20)
GFR: 64.2 mL/min (ref >60.00)
Glucose, Bld: 199 mg/dL — ABNORMAL HIGH (ref 70–99)
Potassium: 3.9 mEq/L (ref 3.5–5.1)
Sodium: 143 mEq/L (ref 135–145)

## 2022-04-11 LAB — TSH: TSH: 1.16 u[IU]/mL (ref 0.35–5.50)

## 2022-04-11 LAB — T4, FREE: Free T4: 1.73 ng/dL — ABNORMAL HIGH (ref 0.60–1.60)

## 2022-04-11 MED ORDER — CIPROFLOXACIN HCL 0.3 % OP SOLN: 1.0000 [drp] | OPHTHALMIC | 0 refills | Status: DC

## 2022-04-11 NOTE — Patient Instructions (Signed)
Nice to see you. We will treat you with ciprofloxacin eyedrops.  If you are not improving with this please let us know. If you develop eye pain again or feel as though you have foreign body in your eyes or have vision changes or have significant light sensitivity please seek medical attention immediately in the ED or with the ophthalmologist.

## 2022-04-11 NOTE — Progress Notes (Signed)
[name], MD Phone: [phone]  Valerie Vazquez is a 66 y.o. female who presents today for follow-up.  Conjunctivitis: Patient was treated with erythromycin for bacterial conjunctivitis.  She notes continued symptoms in her right eye though they have improved quite a bit.  She notes still having some drainage from her bilateral eyes.  She notes her vision is a little blurry bilaterally.  Her right eye is still little red.  She notes she was having pain with trying to focus on things and with movement of her right eye though that has resolved.  She notes no foreign body sensation.  No photophobia.  She has an appointment with ophthalmology at the end of the month.  She was supposed to see [facility] though she declined that.  Social History   Tobacco Use  Smoking Status Former   Packs/day: 1.50   Years: 10.00   Total pack years: 15.00   Types: Cigarettes   Quit date: 04/12/1979   Years since quitting: 43.0  Smokeless Tobacco Never    Current Outpatient Medications on File Prior to Visit  Medication Sig Dispense Refill   acetaminophen (TYLENOL) 500 MG tablet Take 1 tablet (500 mg total) by mouth every 6 (six) hours as needed. 30 tablet 0   baclofen (LIORESAL) 10 MG tablet TAKE ONE TABLET BY MOUTH THREE TIMES DAILY AS NEEDED FOR MUSCLE SPASMS (VIAL) 60 tablet 1   benzonatate (TESSALON) 100 MG capsule Take 1 capsule (100 mg total) by mouth 3 (three) times daily as needed for cough. 21 capsule 0   blood glucose meter kit and supplies KIT Dispense based on patient and insurance preference. Use once daily fasting. Dx code E11.9. 1 each 0   Blood Glucose Monitoring Suppl (GHT BLOOD GLUCOSE MONITOR) w/Device KIT DISPENSE BASED ON PATIENT AND INSURANCE PREFERENCE. USE ONCE DAILY AS DIRECTED. DX CODE E11.9. 1 kit 0   dicyclomine (BENTYL) 20 MG tablet TAKE ONE TABLET BY MOUTH THREE TIMES DAILY AS NEEDED FOR ABDOMINAL SPASMS (VIAL) 270 tablet 11   diphenhydrAMINE (BENADRYL) 25 MG tablet  Take 1 tablet (25 mg total) by mouth every 6 (six) hours. 12 tablet 0   ELIQUIS 5 MG TABS tablet TAKE ONE TABLET BY MOUTH TWICE DAILY _0  & 5PM 60 tablet 11   enoxaparin (LOVENOX) 100 MG/ML injection Inject 1 mL (100 mg total) into the skin every 12 (twelve) hours. Take the morning and evening of 02/09/22 and 02/10/22, take in the morning of 02/11/22 5 mL 0   erythromycin ophthalmic ointment Use 1/4 inch in each eye tid for 5 days 3.5 g 0   escitalopram (LEXAPRO) 20 MG tablet TAKE 1 TABLET BY MOUTH EVERY DAY 90 tablet 1   estradiol (ESTRACE) 0.1 MG/GM vaginal cream Dispose applicator, use pea size amount 3 times a week at night 42.5 g 12   ezetimibe (ZETIA) 10 MG tablet Take 1 tablet (10 mg total) by mouth daily. 90 tablet 1   famotidine (PEPCID) 20 MG tablet TAKE ONE TABLET BY MOUTH DAILY AT 9PM AT BEDTIME 90 tablet 11   fluticasone (FLONASE) 50 MCG/ACT nasal spray Place 2 sprays into both nostrils daily. 16 g 6   furosemide (LASIX) 20 MG tablet TAKE ONE TABLET BY MOUTH DAILY AS NEEDED for edema. (VIAL) 90 tablet 11   gabapentin (NEURONTIN) 600 MG tablet TAKE ONE TABLET BY MOUTH DAILY AT 9AM and TAKE 1 AND 1/2 TABLETS DAILY AT 9PM AT BEDTIME 225 tablet 11   isosorbide mononitrate (IMDUR) 30 MG  24 hr tablet Take 1 tablet (30 mg total) by mouth daily. 90 tablet 2   JARDIANCE 25 MG TABS tablet TAKE ONE TABLET BY MOUTH DAILY AT 9AM 90 tablet 1   levothyroxine (SYNTHROID) 25 MCG tablet TAKE 1 TABLET BY MOUTH BEFORE BREAKFAST 2-3 HOURS BEFORE TAKING ANY OTHER MEDICATION OR SUPPLEMENT 90 tablet 3   lidocaine (LIDODERM) 5 % Place 1 patch onto the skin daily. Remove & Discard patch within 12 hours. 30 patch 0   lubiprostone (AMITIZA) 8 MCG capsule Take 1 capsule (8 mcg total) by mouth 2 (two) times daily with a meal. 180 capsule 2   Multiple Vitamins-Minerals (SUPER THERA VITE M) TABS Take by mouth.     mupirocin ointment (BACTROBAN) 2 % Apply 1 application. topically 2 (two) times daily. 22 g 0    nitroGLYCERIN (NITROSTAT) 0.4 MG SL tablet Place 1 tablet (0.4 mg total) under the tongue every 5 (five) minutes as needed for chest pain. 25 tablet 3   OLANZapine (ZYPREXA) 10 MG tablet TAKE 1 TABLET BY MOUTH EVERYDAY AT BEDTIME (Patient taking differently: Take 15 mg by mouth at bedtime. TAKE 1 TABLET BY MOUTH EVERYDAY AT BEDTIME) 90 tablet 0   ondansetron (ZOFRAN) 4 MG tablet TAKE ONE TABLET BY MOUTH TWICE DAILY AS NEEDED FOR NAUSEA AND VOMITING (VIAL) 30 tablet 1   pantoprazole (PROTONIX) 40 MG tablet TAKE ONE TABLET BY MOUTH DAILY AT 9AM 72 tablet 11   potassium chloride SA (KLOR-CON M) 20 MEQ tablet TAKE ONE TABLET BY MOUTH DAILY AS NEEDED TAKE WITH LASIX (VIAL) 90 tablet 11   rosuvastatin (CRESTOR) 40 MG tablet Take 1 tablet (40 mg total) by mouth daily. 90 tablet 1   sitaGLIPtin (JANUVIA) 100 MG tablet TAKE ONE TABLET BY MOUTH DAILY AT 9AM 90 tablet 1   triamcinolone ointment (KENALOG) 0.5 % APPLY TO AFFECTED AREA TWICE A DAY, for up to a week 30 g 0   No current facility-administered medications on file prior to visit.     ROS see history of present illness  Objective  Physical Exam Vitals:   04/11/22 1343  BP: 120/80  Pulse: 88  Temp: 99 F (37.2 C)  SpO2: 95%    BP Readings from Last 3 Encounters:  04/11/22 120/80  04/02/22 135/79  03/28/22 120/78   Wt Readings from Last 3 Encounters:  04/11/22 222 lb 6.4 oz (100.9 kg)  04/02/22 218 lb 3.2 oz (99 kg)  03/28/22 218 lb 3.2 oz (99 kg)    Physical Exam Eyes:     Extraocular Movements: Extraocular movements intact.     Pupils: Pupils are equal, round, and reactive to light.     Comments: Conjunctiva of the inferior portion of her right eye is somewhat erythematous, mild clear discharge from both eyes, other portions of her conjunctiva are normal, bilateral corneas grossly appear normal, visual fields intact    Vision screening Both 20/20 Right 20/25 Left 20/25  Assessment/Plan: Please see individual problem  list.  Problem List Items Addressed This Visit     Bacterial conjunctivitis    Patient has bacterial conjunctivitis that has not been fully treated.  We will proceed with ciprofloxacin eyedrops.  If not improving she will let us know.  She will see ophthalmology as planned.  The patient is concerned that she could have thyroid eye disease and thus we will check thyroid labs.  We discussed if she developed eye pain, foreign body sensation, vision changes, or photophobia she needs to seek medical  attention immediately.      Hypothyroidism   Other Visit Diagnoses     AKI (acute kidney injury) ([place])       LFTs abnormal          Return if symptoms worsen or fail to improve.   [name], MD [place]

## 2022-04-11 NOTE — Assessment & Plan Note (Signed)
Patient has bacterial conjunctivitis that has not been fully treated.  We will proceed with ciprofloxacin eyedrops.  If not improving she will let us know.  She will see ophthalmology as planned.  The patient is concerned that she could have thyroid eye disease and thus we will check thyroid labs.  We discussed if she developed eye pain, foreign body sensation, vision changes, or photophobia she needs to seek medical attention immediately.

## 2022-04-12 IMAGING — CT CT ANGIO CHEST
2 of 7 series · 13 of 36 positions shown · IV contrast (omnipaque)
Comparison: Radiography 06/01/2020.  CT angiography 07/17/2018.

CLINICAL DATA: Right-sided chest pain. Elevated D-dimer. Previous
history of pulmonary emboli. Chest pain and pressure since last
week.

EXAM:
CT ANGIOGRAPHY CHEST WITH CONTRAST
TECHNIQUE: Multidetector CT imaging of the chest was performed using the
standard protocol during bolus administration of intravenous
contrast. Multiplanar CT image reconstructions and MIPs were
obtained to evaluate the vascular anatomy.
CONTRAST:  100mL OMNIPAQUE IOHEXOL 350 MG/ML SOLN

[Series 6: thins cta pulmonary 1.00 · axial · 0.63mm/px · z∈[-1144,-902]mm · 12 of 287 slices shown]
[im 23/287  lung]
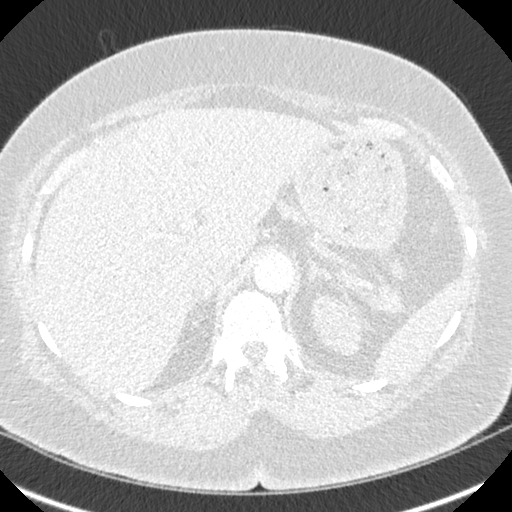
[im 45/287  mediastinal]
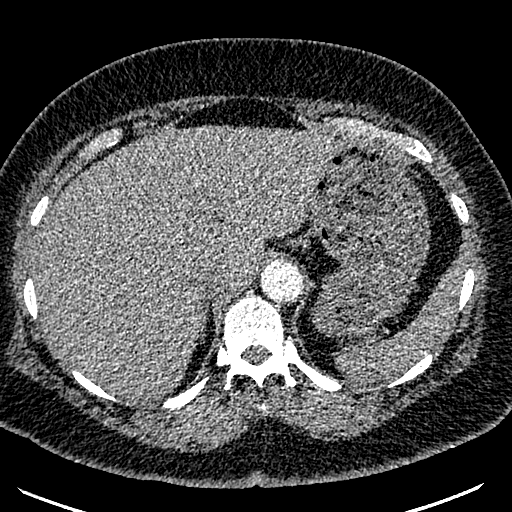
[im 67/287  lung]
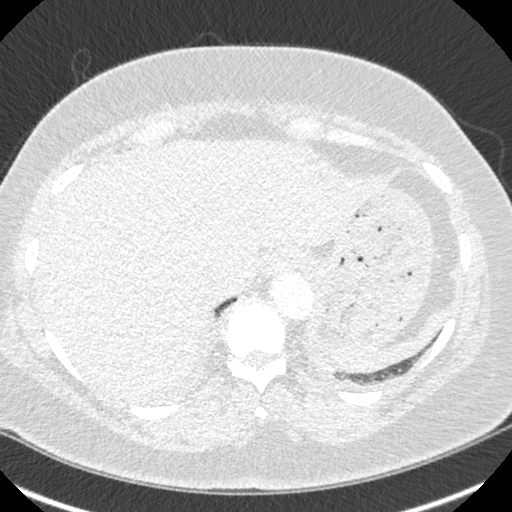
[im 89/287  mediastinal]
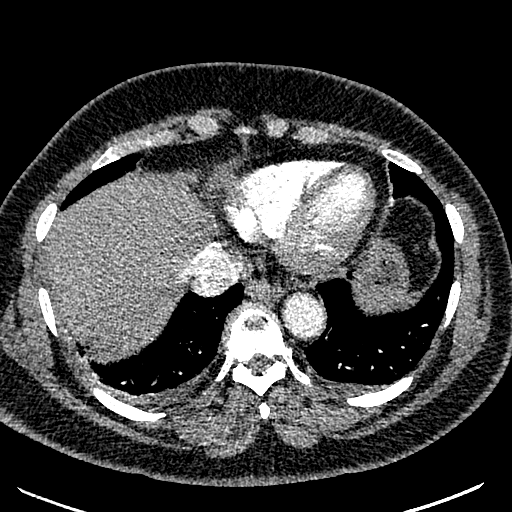
[im 111/287  lung]
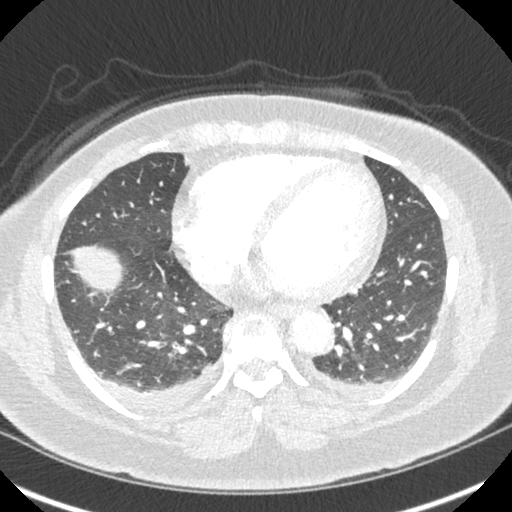
[im 133/287  mediastinal]
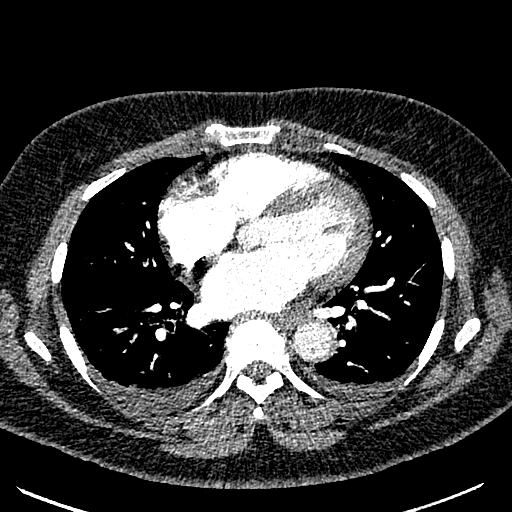
[im 155/287  lung]
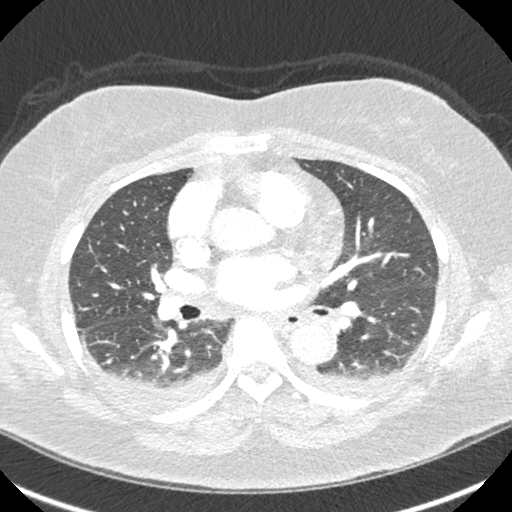
[im 177/287  mediastinal]
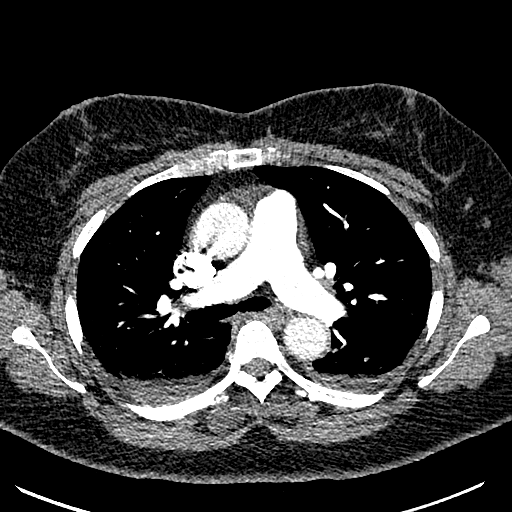
[im 199/287  lung]
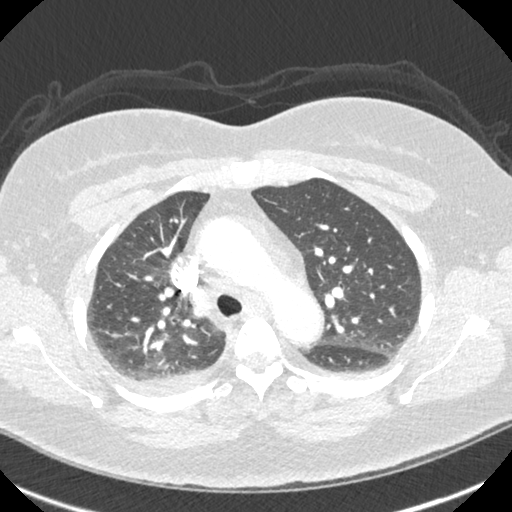
[im 221/287  mediastinal]
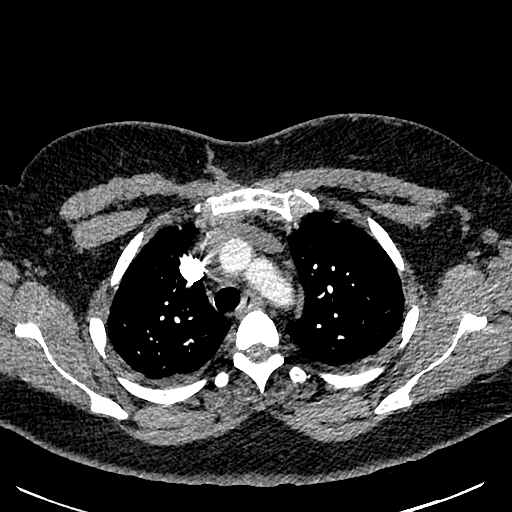
[im 243/287  lung]
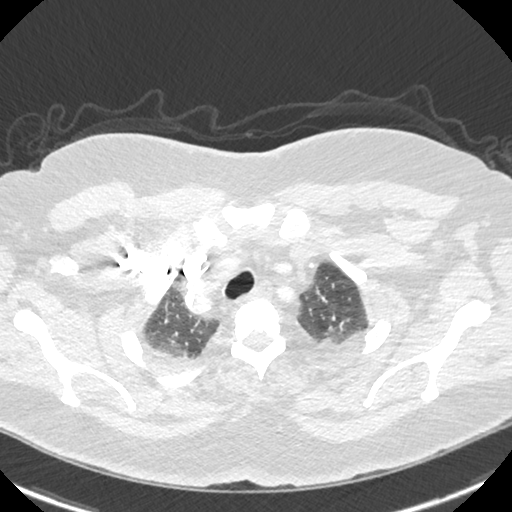
[im 265/287  mediastinal]
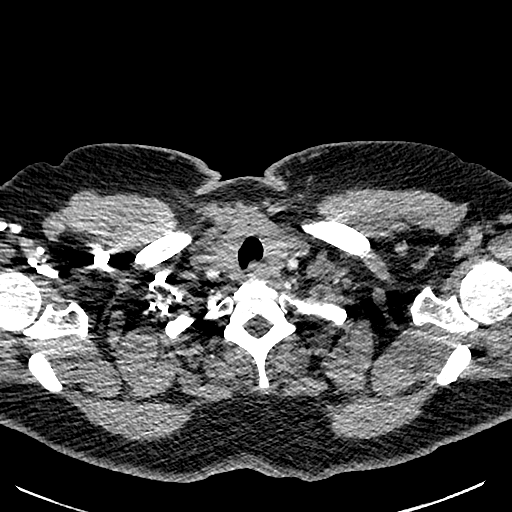

[Series 8: cta pulmonary 2.00 cor · coronal · 0.56mm/px · 1 of 142 slices shown]
[im 71/142  mediastinal]
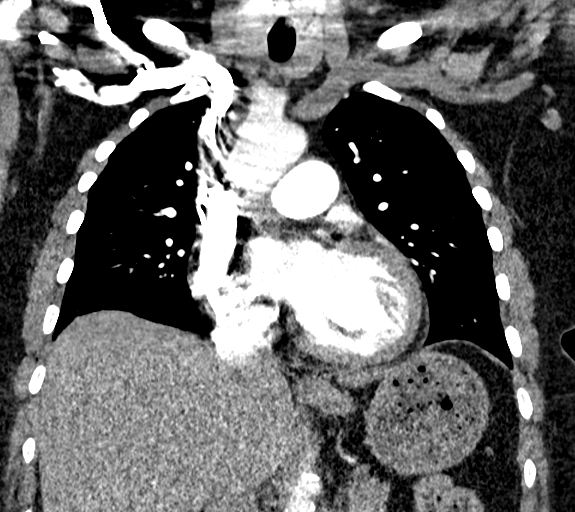

[13 of 36 positions shown; findings below may reference images not displayed]

FINDINGS: Cardiovascular: Pulmonary arterial opacification is good. No
pulmonary emboli. Mild atherosclerotic calcification in the aortic
root region. No aneurysm or dissection. Some coronary artery
calcification is present. Heart size is normal. No pericardial
fluid.

Mediastinum/Nodes: No mediastinal or hilar mass or lymphadenopathy.

Lungs/Pleura: Small pleural effusions layering dependently. Mild
dependent pulmonary atelectasis. Findings quite similar to the study
of 07/17/2018. Non dependent lungs are well aerated and clear.

Upper Abdomen: Normal.  Previous cholecystectomy.

Musculoskeletal: Ordinary mild thoracic degenerative changes.

Review of the MIP images confirms the above findings.
IMPRESSION: 1. No pulmonary emboli.
2. Small pleural effusions layering dependently with mild dependent
pulmonary atelectasis. Findings quite similar to the study of
07/17/2018.
3. Aortic atherosclerosis. Coronary artery calcification.

Aortic Atherosclerosis ([id number]).

## 2022-04-13 DIAGNOSIS — E785 Hyperlipidemia, unspecified: Secondary | ICD-10-CM | POA: Diagnosis not present

## 2022-04-13 DIAGNOSIS — F32A Depression, unspecified: Secondary | ICD-10-CM | POA: Diagnosis not present

## 2022-04-13 DIAGNOSIS — E119 Type 2 diabetes mellitus without complications: Secondary | ICD-10-CM | POA: Diagnosis not present

## 2022-04-13 DIAGNOSIS — E039 Hypothyroidism, unspecified: Secondary | ICD-10-CM | POA: Diagnosis not present

## 2022-04-14 NOTE — Telephone Encounter (Signed)
I called the patient and asked her the date of her surgery and she stated she does not have a date yet but she will call the office to see if they have scheduled and she will let us know. Karenna Romanoff,cma

## 2022-04-14 NOTE — Telephone Encounter (Signed)
Noted. Will await the surgical date. If she does not call back with this please call the surgeons office and see if they can give the surgical date so that I can tailor her lovenox bridge instructions to the date of her surgery.

## 2022-04-15 ENCOUNTER — Other Ambulatory Visit: Payer: Self-pay | Admitting: Family Medicine

## 2022-04-15 NOTE — Telephone Encounter (Signed)
The patient has a date of 05/15/2022 for her surgery and she stated she does not have a gall bladder it was removed years ago.  She is asking the provider to send over the form for the bridge just in case we don't have it. Rexann Lueras,cma

## 2022-04-16 ENCOUNTER — Telehealth: Payer: Self-pay

## 2022-04-16 ENCOUNTER — Telehealth: Payer: Self-pay | Admitting: Family Medicine

## 2022-04-16 ENCOUNTER — Other Ambulatory Visit: Payer: Self-pay | Admitting: Family Medicine

## 2022-04-16 ENCOUNTER — Other Ambulatory Visit: Payer: Self-pay | Admitting: Obstetrics and Gynecology

## 2022-04-16 DIAGNOSIS — R7989 Other specified abnormal findings of blood chemistry: Secondary | ICD-10-CM

## 2022-04-16 LAB — T3: T3, Total: 119 ng/dL (ref 76–181)

## 2022-04-16 LAB — THYROID STIMULATING IMMUNOGLOBULIN: TSI: <89 % baseline (ref <140)

## 2022-04-16 NOTE — Telephone Encounter (Signed)
Did you wasn't hepatic function panel scheduled before or after scan?

## 2022-04-16 NOTE — Telephone Encounter (Signed)
Patient states Dr. [name] states he would like to test patient's enzymes after her liver scan.  I checked patient's chart and the order for the lab work is in [employer].  I asked patient to please call us back once she has scheduled her scan and we will be glad to schedule her lab appointment.

## 2022-04-16 NOTE — Telephone Encounter (Signed)
Pt called stating she has a liver scan on august 22nd at 845 am and they want her to do blood work around the same time

## 2022-04-16 NOTE — Telephone Encounter (Signed)
She can have the liver enzymes checked next week as scheduled.

## 2022-04-17 NOTE — Telephone Encounter (Signed)
Patient scheduled 8/22 at 10:45 to have hepatic function checked.

## 2022-04-18 ENCOUNTER — Telehealth: Payer: Self-pay | Admitting: Cardiovascular Disease

## 2022-04-18 NOTE — Telephone Encounter (Signed)
   Pre-operative Risk Assessment    Patient Name: Valerie Vazquez  DOB: Mar 01, 1956 MRN: [id number]     Request for Surgical Clearance    Procedure:  Bilateral Salpingo Oophorectomy   Date of Surgery:  Clearance 05/01/22                                 Surgeon:  Prentice Docker, MD  Surgeon's Group or Practice Name:   Phone number:  [phone] Fax number:  [phone]   Type of Clearance Requested:   - Medical    Type of Anesthesia:  Choice    Signed, Mendel Ryder   04/18/2022, 3:49 PM

## 2022-04-18 NOTE — Telephone Encounter (Signed)
See phone not from 07/20  Referral has been sent over by Dr. Prentice Docker for preop clearance. Just FYI

## 2022-04-18 NOTE — Telephone Encounter (Signed)
Pt called wanting to know if we receive a pre-op clearance

## 2022-04-18 NOTE — Telephone Encounter (Signed)
   Name: Valerie Vazquez  DOB: [birth year]  MRN: [id number]   Primary Cardiologist: Kate Sable, MD  Chart reviewed as part of pre-operative protocol coverage. Patient was contacted 04/18/2022 in reference to pre-operative risk assessment for pending surgery as outlined below.  Valerie Vazquez was last seen on 03/14/2022 by Dr. Angelena Form.  Since that day, Valerie Vazquez has done well from a cardiac standpoint. She denies any new symptoms or concerns. She is able to achieve > 4 METS.  Therefore, based on ACC/AHA guidelines, the patient would be at acceptable risk for the planned procedure without further cardiovascular testing.   The patient was advised that if she develops new symptoms prior to surgery to contact our office to arrange for a follow-up visit, and she verbalized understanding.  I will route this recommendation to the requesting party via Epic fax function and remove from pre-op pool. Please call with questions.  Lenna Sciara, NP 04/18/2022, 4:51 PM

## 2022-04-22 ENCOUNTER — Ambulatory Visit
Admission: RE | Admit: 2022-04-22 | Discharge: 2022-04-22 | Disposition: A | Discharge status: Home / Self Care | Payer: Medicare Other | Source: Ambulatory Visit | Attending: Family Medicine | Admitting: Family Medicine

## 2022-04-22 ENCOUNTER — Other Ambulatory Visit (INDEPENDENT_AMBULATORY_CARE_PROVIDER_SITE_OTHER): Payer: Medicare Other

## 2022-04-22 DIAGNOSIS — R7989 Other specified abnormal findings of blood chemistry: Secondary | ICD-10-CM | POA: Insufficient documentation

## 2022-04-22 DIAGNOSIS — Z9049 Acquired absence of other specified parts of digestive tract: Secondary | ICD-10-CM | POA: Diagnosis not present

## 2022-04-22 DIAGNOSIS — R945 Abnormal results of liver function studies: Secondary | ICD-10-CM | POA: Diagnosis not present

## 2022-04-22 LAB — HEPATIC FUNCTION PANEL
ALT: 32 U/L (ref 0–35)
AST: 22 U/L (ref 0–37)
Albumin: 3.8 g/dL (ref 3.5–5.2)
Alkaline Phosphatase: 121 U/L — ABNORMAL HIGH (ref 39–117)
Bilirubin, Direct: 0.1 mg/dL (ref 0.0–0.3)
Total Bilirubin: 0.5 mg/dL (ref 0.2–1.2)
Total Protein: 6.7 g/dL (ref 6.0–8.3)

## 2022-04-23 ENCOUNTER — Encounter: Payer: Self-pay | Admitting: Family Medicine

## 2022-04-24 ENCOUNTER — Telehealth: Payer: Self-pay | Admitting: *Deleted

## 2022-04-24 NOTE — Patient Outreach (Signed)
  Care Coordination   Initial Visit Note   04/24/2022 Name: Valerie Vazquez MRN: [id number] DOB: [birth year]  Valerie Vazquez Hinkle is a 66 y.o. year old female who sees Caryl Bis, Angela Adam, MD for primary care. I spoke with  Valerie Vazquez [place] by phone today  What matters to the patients health and wellness today?  My diabetes and my eye have some kind of infection. Scared of going blind.     Goals Addressed             This Visit's Progress    Develop Plan of Care for Management of Diabetes          SDOH assessments and interventions completed:  Yes     Care Coordination Interventions Activated:  Yes  Care Coordination Interventions:  Yes, provided   Follow up plan: Referral made to [place] [facility] [place] worker    Encounter Outcome:  Pt. Visit Completed   [facility] [phone]

## 2022-04-25 ENCOUNTER — Other Ambulatory Visit: Payer: Medicare Other

## 2022-04-25 DIAGNOSIS — H2513 Age-related nuclear cataract, bilateral: Secondary | ICD-10-CM | POA: Diagnosis not present

## 2022-04-25 DIAGNOSIS — H02884 Meibomian gland dysfunction left upper eyelid: Secondary | ICD-10-CM | POA: Diagnosis not present

## 2022-04-25 DIAGNOSIS — E119 Type 2 diabetes mellitus without complications: Secondary | ICD-10-CM | POA: Diagnosis not present

## 2022-04-28 MED ORDER — ENOXAPARIN SODIUM 100 MG/ML IJ SOSY: 1.0000 mg/kg | PREFILLED_SYRINGE | Freq: Two times a day (BID) | INTRAMUSCULAR | 0 refills | Status: DC

## 2022-04-28 NOTE — Telephone Encounter (Signed)
I called and LVM for patient to call back. Valerie Vazquez,cma

## 2022-04-28 NOTE — Telephone Encounter (Signed)
Noted.  Letter created to be sent to the surgeon regarding her Eliquis and Lovenox instructions.  She will take her last dose of Eliquis the evening of 05/11/2022.  She will start Lovenox the morning of 05/12/2022.  She will take the Lovenox in the morning and evening on 9/11 and 9/12 and she will take a dose of Lovenox in the morning on 9/13.  She will not have any anticoagulation the evening of 9/13 and then her surgeon can determine when she can restart her Eliquis after the surgery.

## 2022-04-29 IMAGING — MR MR LUMBAR SPINE W/O CM
5 series · 31 of 48 positions shown · non-contrast
Comparison: 02/28/2020

CLINICAL DATA: Low to mid back pain radiating into the ribs for 3
weeks. Incontinence.

EXAM:
MRI LUMBAR SPINE WITHOUT CONTRAST
TECHNIQUE: Multiplanar, multisequence MR imaging of the lumbar spine was
performed. No intravenous contrast was administered.

[Series 5: T2 · sagittal · 4.0mm · 0.81mm/px · 6 of 17 slices shown (1 of 2)]
[im 1/17]
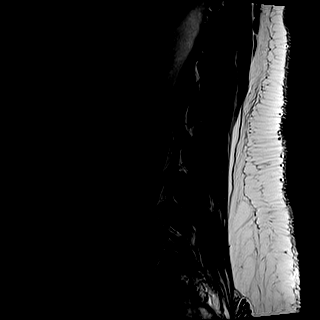
[im 4/17]
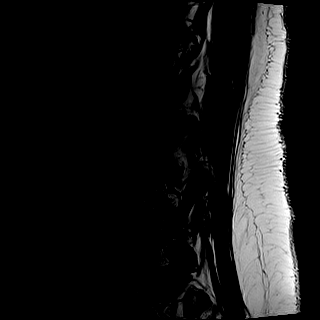
[im 7/17]
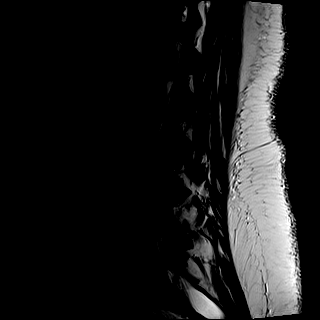
[im 10/17]
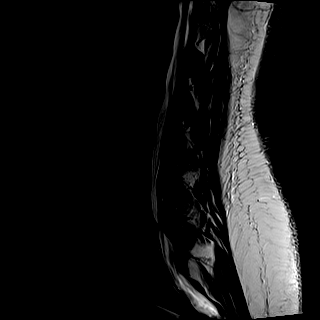
[im 13/17]
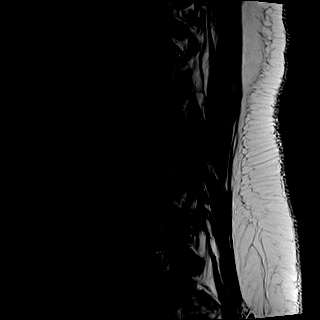
[im 17/17]
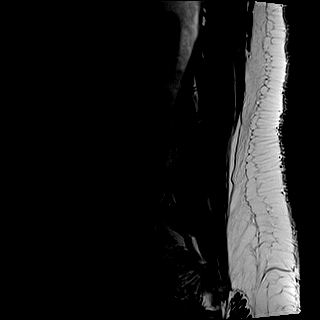

[Series 6: T1 · sagittal · 4.0mm · 0.81mm/px · 6 of 17 slices shown (1 of 2)]
[im 1/17]
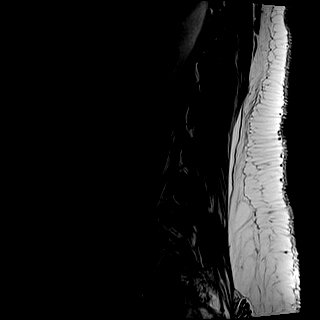
[im 4/17]
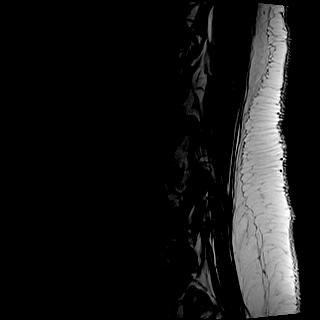
[im 7/17]
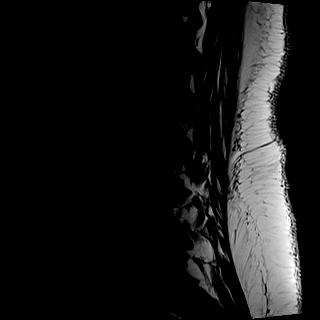
[im 10/17]
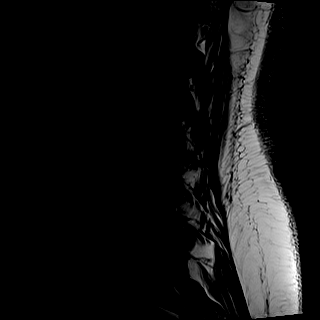
[im 13/17]
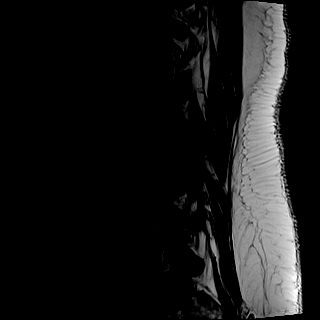
[im 17/17]
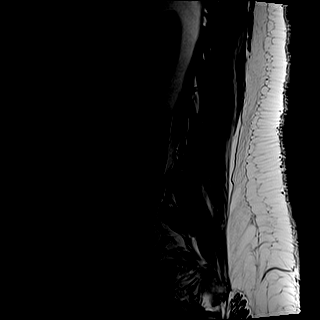

[Series 7: STIR · sagittal · 4.0mm · 0.41mm/px · 1 of 17 slices shown]
[im 1/17]
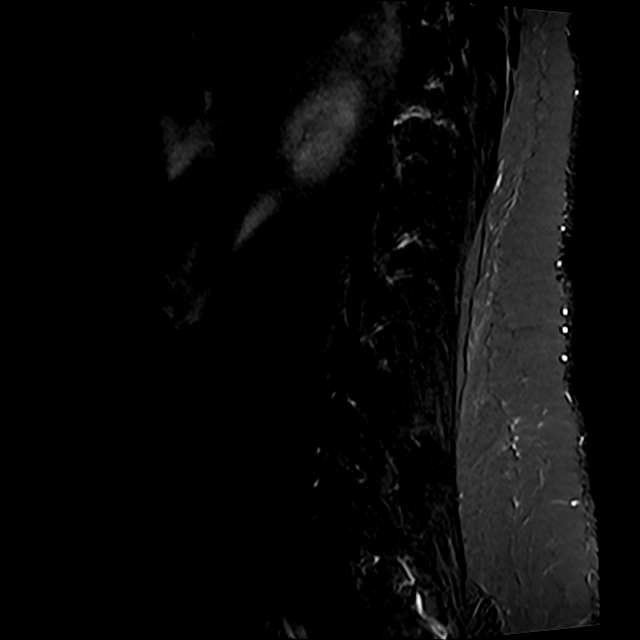

[Series 8: T2 · axial · 4.0mm · 0.78mm/px · z∈[-135,+94]mm · 9 of 40 slices shown (2 of 2)]
[im 1/40]
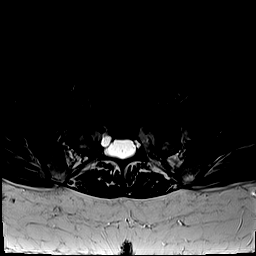
[im 6/40]
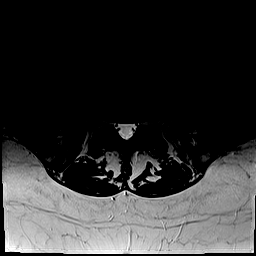
[im 12/40]
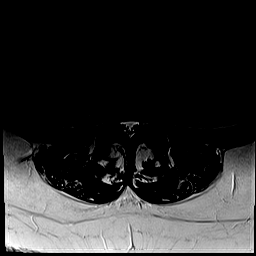
[im 17/40]
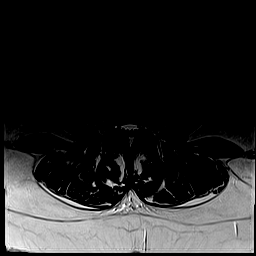
[im 20/40]
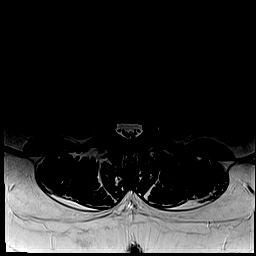
[im 23/40]
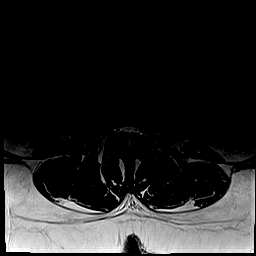
[im 28/40]
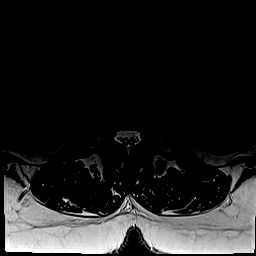
[im 34/40]
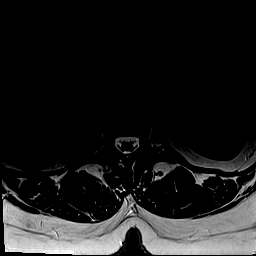
[im 40/40]
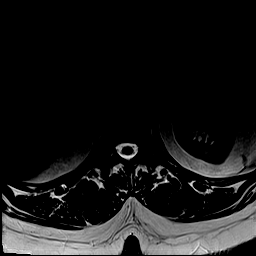

[Series 9: T1 · axial · 4.0mm · 0.39mm/px · z∈[-135,+94]mm · 9 of 40 slices shown (2 of 2)]
[im 1/40]
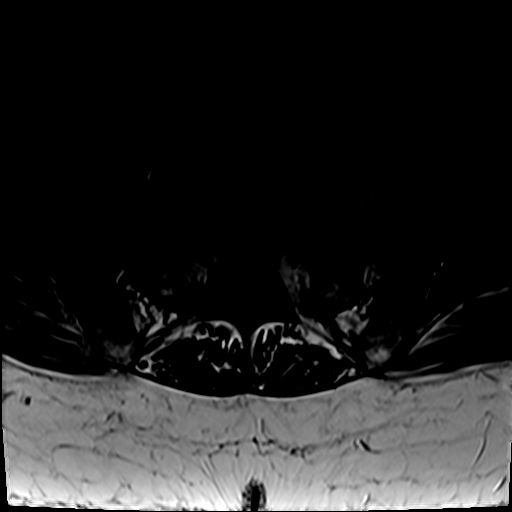
[im 6/40]
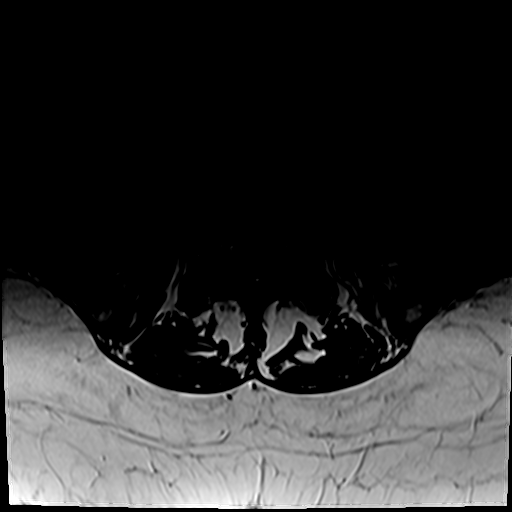
[im 12/40]
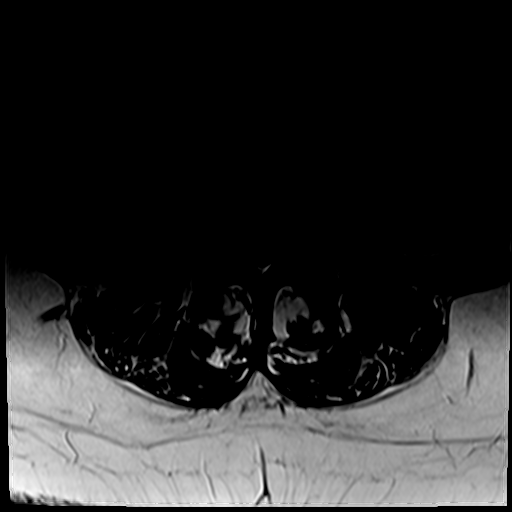
[im 17/40]
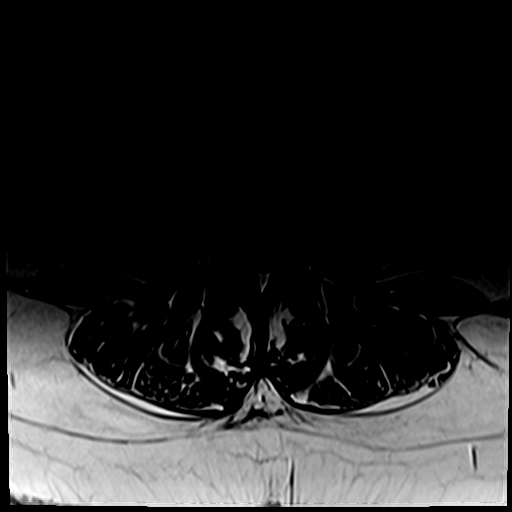
[im 20/40]
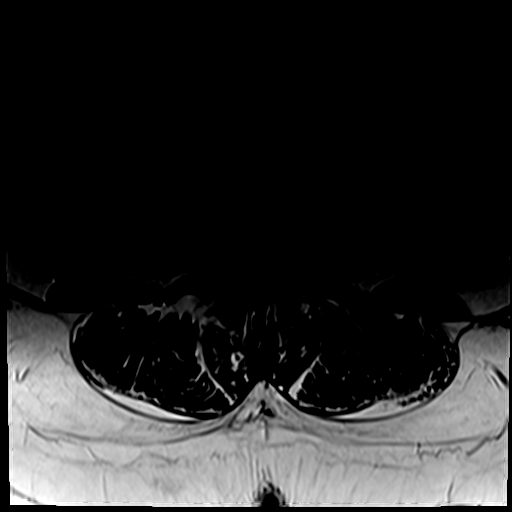
[im 23/40]
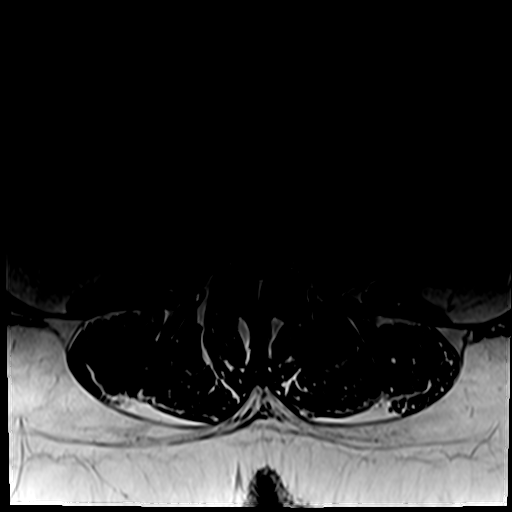
[im 28/40]
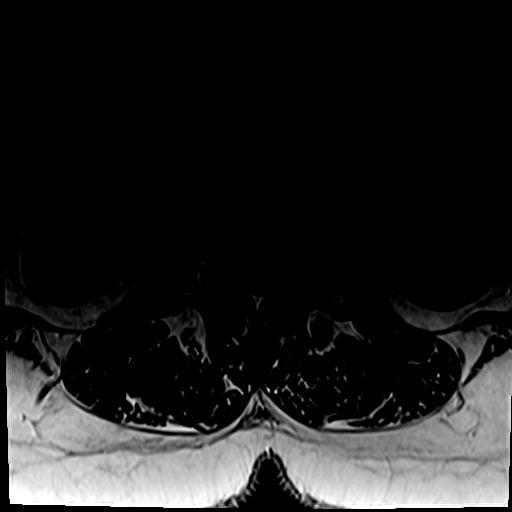
[im 34/40]
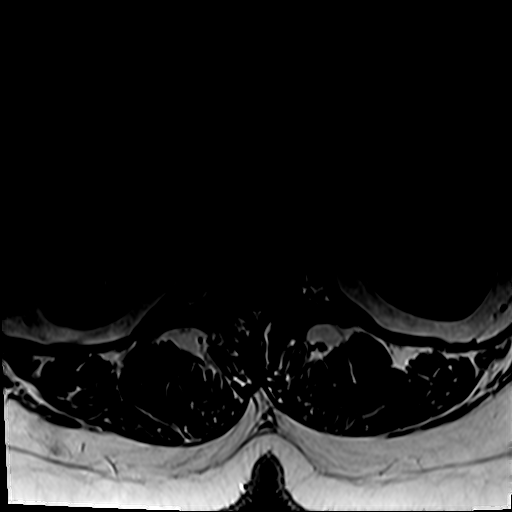
[im 40/40]
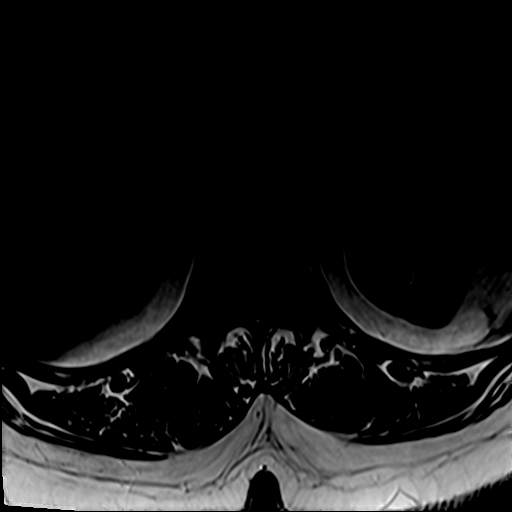

[31 of 48 positions shown; findings below may reference images not displayed]

FINDINGS: Segmentation:  Standard.

Alignment: Unchanged trace anterolisthesis of L4 on L5, facet
mediated.

Vertebrae: No fracture or suspicious osseous lesion. Persistent mild
bilateral facet edema at L4-5.

Conus medullaris and cauda equina: Conus extends to the L2 level.
Conus and cauda equina appear normal.

Paraspinal and other soft tissues: Subcentimeter T2 hyperintensities
in the left kidney, likely cysts.

Disc levels:

T12-L1: Normal disc.  Mild facet hypertrophy without stenosis.

L1-2: Normal disc. Moderate right and mild left facet hypertrophy
without stenosis.

L2-3: Normal disc. Severe right and mild-to-moderate left facet
hypertrophy result in borderline spinal stenosis without neural
foraminal stenosis, unchanged.

L3-4: Normal disc. Severe bilateral facet hypertrophy results in
mild spinal stenosis without neural foraminal stenosis, unchanged.

L4-5: Disc desiccation. Anterolisthesis with mild bulging of
uncovered disc and severe facet and ligamentum flavum hypertrophy
result in moderate spinal stenosis, moderate bilateral lateral
recess stenosis, and mild right neural foraminal stenosis. Spinal
and lateral recess stenosis is stable to minimally increased, and
there is potential L5 nerve root impingement bilaterally.

L5-S1: Normal disc. Moderate to severe facet hypertrophy without
stenosis, unchanged.
IMPRESSION: 1. Severe lumbar facet arthrosis with unchanged trace
anterolisthesis at L4-5.
2. Stable to slight progression of moderate spinal and lateral
recess stenosis at L4-5.
3. Unchanged mild spinal stenosis at L3-4.

## 2022-04-29 NOTE — Telephone Encounter (Signed)
I called and spoke with the patient and inforemed her of her eliquis and lovenox instruction, I also informed her that the letter with instructions would be in [facility] and that I had sent the instructions also to the surgeon. She understood.    Arabell Neria,cma

## 2022-05-01 DIAGNOSIS — E039 Hypothyroidism, unspecified: Secondary | ICD-10-CM | POA: Diagnosis not present

## 2022-05-01 DIAGNOSIS — E119 Type 2 diabetes mellitus without complications: Secondary | ICD-10-CM | POA: Diagnosis not present

## 2022-05-01 NOTE — Telephone Encounter (Signed)
Pt called stating she sent a mychart message and no one has responded yet also the pharmacy has not received the medication-lovenox

## 2022-05-07 ENCOUNTER — Encounter
Admission: RE | Admit: 2022-05-07 | Discharge: 2022-05-07 | Disposition: A | Discharge status: Home / Self Care | Payer: Medicare Other | Source: Ambulatory Visit | Attending: Obstetrics and Gynecology | Admitting: Obstetrics and Gynecology

## 2022-05-07 ENCOUNTER — Other Ambulatory Visit: Payer: Self-pay

## 2022-05-07 ENCOUNTER — Other Ambulatory Visit: Payer: Medicare Other

## 2022-05-07 DIAGNOSIS — Z01812 Encounter for preprocedural laboratory examination: Secondary | ICD-10-CM

## 2022-05-07 DIAGNOSIS — E119 Type 2 diabetes mellitus without complications: Secondary | ICD-10-CM

## 2022-05-07 DIAGNOSIS — I10 Essential (primary) hypertension: Secondary | ICD-10-CM

## 2022-05-07 DIAGNOSIS — L600 Ingrowing nail: Secondary | ICD-10-CM

## 2022-05-07 HISTORY — DX: Angina pectoris, unspecified: I20.9

## 2022-05-07 HISTORY — DX: Other specified postprocedural states: R11.2

## 2022-05-07 HISTORY — DX: Other specified postprocedural states: Z98.890

## 2022-05-07 HISTORY — DX: Hypothyroidism, unspecified: E03.9

## 2022-05-07 NOTE — Patient Instructions (Addendum)
Your procedure is scheduled on: 05/15/22 - Thursday Report to the Registration Desk on the 1st floor of the [facility]. To find out your arrival time, please call [phone] between 1PM - 3PM on: 05/14/22 - Wednesday If your arrival time is 6:00 am, do not arrive prior to that time as the [facility] entrance doors do not open until 6:00 am.  REMEMBER: Instructions that are not followed completely may result in serious medical risk, up to and including death; or upon the discretion of your surgeon and anesthesiologist your surgery may need to be rescheduled.  Do not eat food after midnight the night before surgery.  No gum chewing, lozengers or hard candies.  You may however, drink CLEAR liquids up to 2 hours before you are scheduled to arrive for your surgery. Do not drink anything within 2 hours of your scheduled arrival time. Type 1 and Type 2 diabetics should only drink water.  In addition, your doctor has ordered for you to drink the provided  Gatorade G2 Drinking this carbohydrate drink up to two hours before surgery helps to reduce insulin resistance and improve patient outcomes. Please complete drinking 2 hours prior to scheduled arrival time.  TAKE ONLY THESE MEDICATIONS THE MORNING OF SURGERY WITH A SIP OF WATER:  - escitalopram (LEXAPRO)  - gabapentin (NEURONTIN) - isosorbide mononitrate  - levothyroxine (SYNTHROID)  - pantoprazole (PROTONIX)  - rosuvastatin (CRESTOR)    STOP JARDIANCE beginning 05/12/22, may resume the day after your surgery.  Follow orders for Lovenox and Eliquis bridge.  One week prior to surgery: Stop Anti-inflammatories (NSAIDS) such as Advil, Aleve, Ibuprofen, Motrin, Naproxen, Naprosyn and Aspirin based products such as Excedrin, Goodys Powder, BC Powder.  Stop ANY OVER THE COUNTER supplements until after surgery.  You may however, continue to take Tylenol if needed for pain up until the day of surgery.  No Alcohol for 24 hours before  or after surgery.  No Smoking including e-cigarettes for 24 hours prior to surgery.  No chewable tobacco products for at least 6 hours prior to surgery.  No nicotine patches on the day of surgery.  Do not use any "recreational" drugs for at least a week prior to your surgery.  Please be advised that the combination of cocaine and anesthesia may have negative outcomes, up to and including death. If you test positive for cocaine, your surgery will be cancelled.  On the morning of surgery brush your teeth with toothpaste and water, you may rinse your mouth with mouthwash if you wish. Do not swallow any toothpaste or mouthwash.  Use CHG Soap or wipes as directed on instruction sheet.  Do not wear jewelry, make-up, hairpins, clips or nail polish.  Do not wear lotions, powders, or perfumes.   Do not shave body from the neck down 48 hours prior to surgery just in case you cut yourself which could leave a site for infection.  Also, freshly shaved skin may become irritated if using the CHG soap.  Contact lenses, hearing aids and dentures may not be worn into surgery.  Do not bring valuables to the hospital. [facility] is not responsible for any missing/lost belongings or valuables.   Notify your doctor if there is any change in your medical condition (cold, fever, infection).  Wear comfortable clothing (specific to your surgery type) to the hospital.  After surgery, you can help prevent lung complications by doing breathing exercises.  Take deep breaths and cough every 1-2 hours. Your doctor may order  a device called an Incentive Spirometer to help you take deep breaths. When coughing or sneezing, hold a pillow firmly against your incision with both hands. This is called "splinting." Doing this helps protect your incision. It also decreases belly discomfort.  If you are being admitted to the hospital overnight, leave your suitcase in the car. After surgery it may be brought to your  room.  If you are being discharged the day of surgery, you will not be allowed to drive home. You will need a responsible adult (18 years or older) to drive you home and stay with you that night.   If you are taking public transportation, you will need to have a responsible adult (18 years or older) with you. Please confirm with your physician that it is acceptable to use public transportation.   Please call the [facility] Dept. at [phone] if you have any questions about these instructions.  Surgery Visitation Policy:  Patients undergoing a surgery or procedure may have two family members or support persons with them as long as the person is not COVID-19 positive or experiencing its symptoms.   Inpatient Visitation:    Visiting hours are 7 a.m. to 8 p.m. Up to four visitors are allowed at one time in a patient room, including children. The visitors may rotate out with other people during the day. One designated support person (adult) may remain overnight.

## 2022-05-08 ENCOUNTER — Other Ambulatory Visit: Payer: Self-pay

## 2022-05-08 ENCOUNTER — Encounter: Payer: Self-pay | Admitting: Urgent Care

## 2022-05-08 ENCOUNTER — Encounter
Admission: RE | Admit: 2022-05-08 | Discharge: 2022-05-08 | Disposition: A | Discharge status: Home / Self Care | Payer: Medicare Other | Source: Ambulatory Visit | Attending: Obstetrics and Gynecology | Admitting: Obstetrics and Gynecology

## 2022-05-08 ENCOUNTER — Encounter: Payer: Self-pay | Admitting: Obstetrics and Gynecology

## 2022-05-08 ENCOUNTER — Ambulatory Visit: Payer: Self-pay | Admitting: *Deleted

## 2022-05-08 DIAGNOSIS — Z01818 Encounter for other preprocedural examination: Secondary | ICD-10-CM | POA: Diagnosis not present

## 2022-05-08 DIAGNOSIS — N83202 Unspecified ovarian cyst, left side: Secondary | ICD-10-CM | POA: Diagnosis not present

## 2022-05-08 DIAGNOSIS — Z01812 Encounter for preprocedural laboratory examination: Secondary | ICD-10-CM

## 2022-05-08 DIAGNOSIS — Z01810 Encounter for preprocedural cardiovascular examination: Secondary | ICD-10-CM | POA: Diagnosis not present

## 2022-05-08 DIAGNOSIS — N83201 Unspecified ovarian cyst, right side: Secondary | ICD-10-CM | POA: Diagnosis not present

## 2022-05-08 LAB — TYPE AND SCREEN
ABO/RH(D): B POS
Antibody Screen: NEGATIVE

## 2022-05-08 NOTE — Patient Instructions (Signed)
Visit Information  Thank you for taking time to visit with me today. Please don't hesitate to contact me if I can be of assistance to you.   Following are the goals we discussed today:   Goals Addressed             This Visit's Progress    " I need an aid following my surgery"       Care Coordination Interventions: Patient discussed plan to have surgery on 05/15/22, [facility] will provide transport there and a friend of hers will provide the transportation home Patient requesting assistance with a in home aid to assist with her care needs post surgery Patient confirmed that she does have UHC/Medicare but does not have full Medicaid which would cover personal care assistance Patient agreeable to contacting her insurance company to discuss any available benefits related to in home care as well as the inpatient care management team Considering family and friends for assistance also discussed-patient agreeable to requesting assistance from close friends as well This social worker's contact information provided for any additional community resource needs         If you are experiencing a Mental Health or [place] or need someone to talk to, please call the Suicide and Crisis Lifeline: 988 call 911   Patient verbalizes understanding of instructions and care plan provided today and agrees to view in [place]. Active MyChart status and patient understanding of how to access instructions and care plan via MyChart confirmed with patient.     No further follow up required: patient will contact her insurance company regarding any available in  home care benefit post discharge from the hospital. Patient advised to also discuss her care needs with family and friends as well as inpatient discharge [place], [place] Worker  [facility] Care Management [phone]

## 2022-05-08 NOTE — Patient Outreach (Signed)
  Care Coordination   Initial Visit Note   05/08/2022 Name: Valerie Vazquez MRN: [id number] DOB: 1955-11-05  Valerie Vazquez is a 66 y.o. year old female who sees Caryl Bis, Angela Adam, MD for primary care. I spoke with  Valerie Vazquez by phone today.  What matters to the patients health and wellness today?  In home care assistance    Goals Addressed             This Visit's Progress    " I need an aid following my surgery"       Care Coordination Interventions: Patient discussed plan to have surgery on 05/15/22, [facility] will provide transport there and a friend of hers will provide the transportation home Patient requesting assistance with a in home aid to assist with her care needs post surgery Patient confirmed that she does have UHC/Medicare but does not have full Medicaid which would cover personal care assistance Patient agreeable to contacting her insurance company to discuss any available benefits related to in home care as well as the inpatient care management team Considering family and friends for assistance also discussed-patient agreeable to requesting assistance from close friends as well This social worker's contact information provided for any additional community resource needs        SDOH assessments and interventions completed:  Yes  SDOH Interventions Today    Flowsheet Row Most Recent Value  SDOH Interventions   Food Insecurity Interventions Intervention Not Indicated  Housing Interventions Intervention Not Indicated  Transportation Interventions Other (Comment)  [Used [employer] transportation benefit-uses [facility] for transportation needs]        Care Coordination Interventions Activated:  Yes  Care Coordination Interventions:  Yes, provided   Follow up plan: No further intervention required.   Encounter Outcome:  Pt. Visit Completed

## 2022-05-13 ENCOUNTER — Ambulatory Visit: Payer: Self-pay

## 2022-05-13 DIAGNOSIS — E119 Type 2 diabetes mellitus without complications: Secondary | ICD-10-CM | POA: Diagnosis not present

## 2022-05-13 DIAGNOSIS — E039 Hypothyroidism, unspecified: Secondary | ICD-10-CM | POA: Diagnosis not present

## 2022-05-13 NOTE — Patient Instructions (Signed)
Visit Information  Thank you for taking time to visit with me today. Please don't hesitate to contact me if I can be of assistance to you.   Following are the goals we discussed today:   Goals Addressed             This Visit's Progress    Patient stated:  Pre-procedure education/ diabetic education       Care Coordination Interventions: Evaluation of current treatment plan related to upcoming GYN ( oophorectomy) surgery and patient's adherence to plan as established by provider Reviewed medications with patient and discussed importance of compliance Reviewed scheduled/upcoming provider appointments  Discussed signs/ symptoms of infection Confirmed patient has transportation to and from hospital and to post surgical follow up appointment.  Discussed importance of managing blood sugars post surgery. Advised to notify provider for frequent high >200 or low <70 blood sugars.  Discussed management of hyper/ hypoglycemic events.           Our next appointment is by telephone on 05/19/22 at 130 pm  Please call the care guide team at [phone] if you need to cancel or reschedule your appointment.   If you are experiencing a Mental Health or [place] or need someone to talk to, please call 1-800-273-TALK (toll free, 24 hour hotline)  Patient verbalizes understanding of instructions and care plan provided today and agrees to view in [place]. Active MyChart status and patient understanding of how to access instructions and care plan via MyChart confirmed with patient.     Quinn Plowman RN,BSN,CCM [place] Coordination [phone] direct line

## 2022-05-13 NOTE — Patient Outreach (Signed)
  Care Coordination   Follow Up Visit Note   05/13/2022 Name: TAFFIE ECKMANN MRN: [id number] DOB: 06-27-1956  Valerie Vazquez Pring is a 66 y.o. year old female who sees Caryl Bis, Angela Adam, MD for primary care. I spoke with  Valerie Vazquez [place] by phone today.  What matters to the patients health and wellness today?  Patient states she is scheduled to have bilateral ovarian surgery on 05/15/22. She states states she has transportation in place for surgical discharge and follow up appointment with provider.  Patient states due to her history of blood clot she is currently taking  Levonox on a bridge program to prepare for surgery.   She reports she has discontinued her Eliquis and vitamins as advised by her surgeon. Patient states she has had DM2 since 2007. She states she would like a review on how to manage the condition.     Goals Addressed             This Visit's Progress    Patient stated:  Pre-procedure education/ diabetic education       Care Coordination Interventions: Evaluation of current treatment plan related to upcoming GYN ( oophorectomy) surgery and patient's adherence to plan as established by provider Reviewed medications with patient and discussed importance of compliance Reviewed scheduled/upcoming provider appointments  Discussed signs/ symptoms of infection Confirmed patient has transportation to and from hospital and to post surgical follow up appointment.  Discussed importance of managing blood sugars post surgery. Advised to notify provider for frequent high >200 or low <70 blood sugars.  Discussed management of hyper/ hypoglycemic events.           SDOH assessments and interventions completed:  No     Care Coordination Interventions Activated:  Yes  Care Coordination Interventions:  Yes, provided   Follow up plan: Follow up call scheduled for 05/19/22 at 1:30 pm    Encounter Outcome:  Pt. Visit Completed   Quinn Plowman RN,BSN,CCM [place] [phone] direct line

## 2022-05-14 MED ORDER — ORAL CARE MOUTH RINSE: 15.0000 mL | Freq: Once | OROMUCOSAL | Status: AC

## 2022-05-14 MED ORDER — LACTATED RINGERS IV SOLN: INTRAVENOUS | Status: DC

## 2022-05-14 MED ORDER — CHLORHEXIDINE GLUCONATE 0.12 % MT SOLN
15.0000 mL | Freq: Once | OROMUCOSAL | Status: AC
  Administered 2022-05-15: 15 mL via OROMUCOSAL

## 2022-05-14 MED ORDER — SODIUM CHLORIDE 0.9 % IV SOLN: INTRAVENOUS | Status: DC

## 2022-05-14 MED ORDER — ENOXAPARIN SODIUM 40 MG/0.4ML IJ SOSY
40.0000 mg | PREFILLED_SYRINGE | INTRAMUSCULAR | Status: AC
  Administered 2022-05-15: 40 mg via SUBCUTANEOUS

## 2022-05-15 ENCOUNTER — Ambulatory Visit
Admission: RE | Admit: 2022-05-15 | Discharge: 2022-05-16 | Disposition: A | Discharge status: Home / Self Care | Payer: Medicare Other | Attending: Obstetrics and Gynecology | Admitting: Obstetrics and Gynecology

## 2022-05-15 ENCOUNTER — Other Ambulatory Visit: Payer: Self-pay

## 2022-05-15 ENCOUNTER — Ambulatory Visit: Payer: Medicare Other | Admitting: Urgent Care

## 2022-05-15 ENCOUNTER — Encounter: Payer: Self-pay | Admitting: Obstetrics and Gynecology

## 2022-05-15 ENCOUNTER — Ambulatory Visit: Payer: Medicare Other

## 2022-05-15 ENCOUNTER — Encounter
Admission: RE | Disposition: A | Discharge status: Home / Self Care | Payer: Self-pay | Source: Home / Self Care | Attending: Obstetrics and Gynecology

## 2022-05-15 DIAGNOSIS — N83201 Unspecified ovarian cyst, right side: Secondary | ICD-10-CM | POA: Diagnosis present

## 2022-05-15 DIAGNOSIS — I252 Old myocardial infarction: Secondary | ICD-10-CM | POA: Diagnosis not present

## 2022-05-15 DIAGNOSIS — I2510 Atherosclerotic heart disease of native coronary artery without angina pectoris: Secondary | ICD-10-CM | POA: Diagnosis not present

## 2022-05-15 DIAGNOSIS — N736 Female pelvic peritoneal adhesions (postinfective): Secondary | ICD-10-CM | POA: Insufficient documentation

## 2022-05-15 DIAGNOSIS — Z86718 Personal history of other venous thrombosis and embolism: Secondary | ICD-10-CM | POA: Insufficient documentation

## 2022-05-15 DIAGNOSIS — Z01812 Encounter for preprocedural laboratory examination: Secondary | ICD-10-CM

## 2022-05-15 DIAGNOSIS — T7840XA Allergy, unspecified, initial encounter: Secondary | ICD-10-CM | POA: Diagnosis not present

## 2022-05-15 DIAGNOSIS — N83292 Other ovarian cyst, left side: Secondary | ICD-10-CM | POA: Insufficient documentation

## 2022-05-15 DIAGNOSIS — E119 Type 2 diabetes mellitus without complications: Secondary | ICD-10-CM

## 2022-05-15 DIAGNOSIS — F05 Delirium due to known physiological condition: Secondary | ICD-10-CM | POA: Diagnosis not present

## 2022-05-15 DIAGNOSIS — Z7901 Long term (current) use of anticoagulants: Secondary | ICD-10-CM | POA: Diagnosis not present

## 2022-05-15 DIAGNOSIS — N83291 Other ovarian cyst, right side: Secondary | ICD-10-CM | POA: Insufficient documentation

## 2022-05-15 DIAGNOSIS — R079 Chest pain, unspecified: Secondary | ICD-10-CM | POA: Insufficient documentation

## 2022-05-15 DIAGNOSIS — Z86711 Personal history of pulmonary embolism: Secondary | ICD-10-CM | POA: Diagnosis not present

## 2022-05-15 DIAGNOSIS — I10 Essential (primary) hypertension: Secondary | ICD-10-CM

## 2022-05-15 HISTORY — DX: Long term (current) use of anticoagulants: Z79.01

## 2022-05-15 HISTORY — PX: ROBOTIC ASSISTED BILATERAL SALPINGO OOPHERECTOMY: SHX6078

## 2022-05-15 HISTORY — DX: Other cervical disc degeneration, unspecified cervical region: M50.30

## 2022-05-15 HISTORY — PX: CYSTOSCOPY: SHX5120

## 2022-05-15 HISTORY — DX: Unspecified ovarian cyst, right side: N83.201

## 2022-05-15 HISTORY — DX: Unspecified ovarian cyst, right side: N83.202

## 2022-05-15 LAB — CBC WITH DIFFERENTIAL/PLATELET
Abs Immature Granulocytes: 0.05 10*3/uL (ref 0.00–0.07)
Basophils Absolute: 0 10*3/uL (ref 0.0–0.1)
Basophils Relative: 0 %
Eosinophils Absolute: 0 10*3/uL (ref 0.0–0.5)
Eosinophils Relative: 0 %
HCT: 39.7 % (ref 36.0–46.0)
Hemoglobin: 12.9 g/dL (ref 12.0–15.0)
Immature Granulocytes: 0 %
Lymphocytes Relative: 9 %
Lymphs Abs: 1 10*3/uL (ref 0.7–4.0)
MCH: 31.6 pg (ref 26.0–34.0)
MCHC: 32.5 g/dL (ref 30.0–36.0)
MCV: 97.3 fL (ref 80.0–100.0)
Monocytes Absolute: 0.1 10*3/uL (ref 0.1–1.0)
Monocytes Relative: 1 %
Neutro Abs: 10.3 10*3/uL — ABNORMAL HIGH (ref 1.7–7.7)
Neutrophils Relative %: 90 %
Platelets: 299 10*3/uL (ref 150–400)
RBC: 4.08 MIL/uL (ref 3.87–5.11)
RDW: 13.2 % (ref 11.5–15.5)
WBC: 11.5 10*3/uL — ABNORMAL HIGH (ref 4.0–10.5)
nRBC: 0 % (ref 0.0–0.2)

## 2022-05-15 LAB — BLOOD GAS, VENOUS
Acid-Base Excess: 0.3 mmol/L (ref 0.0–2.0)
Bicarbonate: 27.3 mmol/L (ref 20.0–28.0)
O2 Saturation: 92 %
Patient temperature: 37
pCO2, Ven: 53 mmHg (ref 44–60)
pH, Ven: 7.32 (ref 7.25–7.43)
pO2, Ven: 63 mmHg — ABNORMAL HIGH (ref 32–45)

## 2022-05-15 LAB — COMPREHENSIVE METABOLIC PANEL
ALT: 24 U/L (ref 0–44)
AST: 29 U/L (ref 15–41)
Albumin: 3.8 g/dL (ref 3.5–5.0)
Alkaline Phosphatase: 100 U/L (ref 38–126)
Anion gap: 8 (ref 5–15)
BUN: 7 mg/dL — ABNORMAL LOW (ref 8–23)
CO2: 26 mmol/L (ref 22–32)
Calcium: 9.1 mg/dL (ref 8.9–10.3)
Chloride: 113 mmol/L — ABNORMAL HIGH (ref 98–111)
Creatinine, Ser: 0.98 mg/dL (ref 0.44–1.00)
GFR, Estimated: >60 mL/min (ref >60)
Glucose, Bld: 202 mg/dL — ABNORMAL HIGH (ref 70–99)
Potassium: 4.1 mmol/L (ref 3.5–5.1)
Sodium: 147 mmol/L — ABNORMAL HIGH (ref 135–145)
Total Bilirubin: 0.8 mg/dL (ref 0.3–1.2)
Total Protein: 7.6 g/dL (ref 6.5–8.1)

## 2022-05-15 LAB — GLUCOSE, CAPILLARY
Glucose-Capillary: 213 mg/dL — ABNORMAL HIGH (ref 70–99)
Glucose-Capillary: 224 mg/dL — ABNORMAL HIGH (ref 70–99)
Glucose-Capillary: 221 mg/dL — ABNORMAL HIGH (ref 70–99)

## 2022-05-15 LAB — POCT I-STAT, CHEM 8
BUN: 4 mg/dL — ABNORMAL LOW (ref 8–23)
Calcium, Ion: 1.26 mmol/L (ref 1.15–1.40)
Chloride: 109 mmol/L (ref 98–111)
Creatinine, Ser: 0.9 mg/dL (ref 0.44–1.00)
Glucose, Bld: 159 mg/dL — ABNORMAL HIGH (ref 70–99)
HCT: 37 % (ref 36.0–46.0)
Hemoglobin: 12.6 g/dL (ref 12.0–15.0)
Potassium: 3.8 mmol/L (ref 3.5–5.1)
Sodium: 145 mmol/L (ref 135–145)
TCO2: 24 mmol/L (ref 22–32)

## 2022-05-15 LAB — ABO/RH: ABO/RH(D): B POS

## 2022-05-15 SURGERY — SALPINGO-OOPHORECTOMY, BILATERAL, ROBOT-ASSISTED
Anesthesia: General | Site: Pelvis

## 2022-05-15 MED ORDER — ISOSORBIDE MONONITRATE ER 30 MG PO TB24
30.0000 mg | ORAL_TABLET | Freq: Every day | ORAL | Status: DC
  Filled 2022-05-15: qty 1

## 2022-05-15 MED ORDER — EMPAGLIFLOZIN 25 MG PO TABS
25.0000 mg | ORAL_TABLET | Freq: Every day | ORAL | Status: DC
  Filled 2022-05-15: qty 1

## 2022-05-15 MED ORDER — BACLOFEN 10 MG PO TABS
10.0000 mg | ORAL_TABLET | Freq: Two times a day (BID) | ORAL | Status: DC
  Administered 2022-05-15: 10 mg via ORAL
  Filled 2022-05-15 (×2): qty 1

## 2022-05-15 MED ORDER — ONDANSETRON HCL 4 MG PO TABS: 4.0000 mg | ORAL_TABLET | Freq: Four times a day (QID) | ORAL | Status: DC | PRN

## 2022-05-15 MED ORDER — DEXAMETHASONE SODIUM PHOSPHATE 10 MG/ML IJ SOLN
INTRAMUSCULAR | Status: AC
  Filled 2022-05-15: qty 1

## 2022-05-15 MED ORDER — INSULIN ASPART 100 UNIT/ML IJ SOLN: 0.0000 [IU] | Freq: Three times a day (TID) | INTRAMUSCULAR | Status: DC

## 2022-05-15 MED ORDER — PHENYLEPHRINE 80 MCG/ML (10ML) SYRINGE FOR IV PUSH (FOR BLOOD PRESSURE SUPPORT)
PREFILLED_SYRINGE | INTRAVENOUS | Status: DC | PRN
  Administered 2022-05-15: 240 ug via INTRAVENOUS
  Administered 2022-05-15 (×2): 160 ug via INTRAVENOUS

## 2022-05-15 MED ORDER — SUCCINYLCHOLINE CHLORIDE 200 MG/10ML IV SOSY
PREFILLED_SYRINGE | INTRAVENOUS | Status: AC
  Filled 2022-05-15: qty 10

## 2022-05-15 MED ORDER — FLUMAZENIL 0.5 MG/5ML IV SOLN
INTRAVENOUS | Status: AC
  Filled 2022-05-15: qty 5

## 2022-05-15 MED ORDER — GLYCOPYRROLATE 0.2 MG/ML IJ SOLN
INTRAMUSCULAR | Status: DC | PRN
  Administered 2022-05-15: .2 mg via INTRAVENOUS

## 2022-05-15 MED ORDER — PROPOFOL 1000 MG/100ML IV EMUL
INTRAVENOUS | Status: AC
  Filled 2022-05-15: qty 100

## 2022-05-15 MED ORDER — DEXAMETHASONE SODIUM PHOSPHATE 10 MG/ML IJ SOLN
INTRAMUSCULAR | Status: DC | PRN
  Administered 2022-05-15: 5 mg via INTRAVENOUS

## 2022-05-15 MED ORDER — FENTANYL CITRATE (PF) 100 MCG/2ML IJ SOLN
INTRAMUSCULAR | Status: AC
  Filled 2022-05-15: qty 2

## 2022-05-15 MED ORDER — LACTATED RINGERS IV SOLN: INTRAVENOUS | Status: DC

## 2022-05-15 MED ORDER — LEVOTHYROXINE SODIUM 25 MCG PO TABS
25.0000 ug | ORAL_TABLET | Freq: Every day | ORAL | Status: DC
  Administered 2022-05-16: 25 ug via ORAL
  Filled 2022-05-15: qty 1

## 2022-05-15 MED ORDER — LIDOCAINE HCL (CARDIAC) PF 100 MG/5ML IV SOSY
PREFILLED_SYRINGE | INTRAVENOUS | Status: DC | PRN
  Administered 2022-05-15: 100 mg via INTRAVENOUS

## 2022-05-15 MED ORDER — ONDANSETRON HCL 4 MG/2ML IJ SOLN
INTRAMUSCULAR | Status: AC
  Filled 2022-05-15: qty 2

## 2022-05-15 MED ORDER — FENTANYL CITRATE (PF) 100 MCG/2ML IJ SOLN: 25.0000 ug | INTRAMUSCULAR | Status: DC | PRN

## 2022-05-15 MED ORDER — FLUMAZENIL 0.5 MG/5ML IV SOLN
0.2000 mg | INTRAVENOUS | Status: AC
  Administered 2022-05-15: 0.2 mg via INTRAVENOUS

## 2022-05-15 MED ORDER — ROCURONIUM BROMIDE 10 MG/ML (PF) SYRINGE
PREFILLED_SYRINGE | INTRAVENOUS | Status: AC
  Filled 2022-05-15: qty 10

## 2022-05-15 MED ORDER — ACETAMINOPHEN 10 MG/ML IV SOLN
INTRAVENOUS | Status: AC
  Filled 2022-05-15: qty 100

## 2022-05-15 MED ORDER — 0.9 % SODIUM CHLORIDE (POUR BTL) OPTIME
TOPICAL | Status: DC | PRN
  Administered 2022-05-15: 500 mL

## 2022-05-15 MED ORDER — INSULIN ASPART 100 UNIT/ML IJ SOLN
0.0000 [IU] | Freq: Three times a day (TID) | INTRAMUSCULAR | Status: DC
  Administered 2022-05-15: 5 [IU] via SUBCUTANEOUS
  Administered 2022-05-16: 2 [IU] via SUBCUTANEOUS
  Filled 2022-05-15 (×2): qty 1

## 2022-05-15 MED ORDER — PROPOFOL 10 MG/ML IV BOLUS
INTRAVENOUS | Status: DC | PRN
  Administered 2022-05-15: 150 mg via INTRAVENOUS

## 2022-05-15 MED ORDER — CHLORHEXIDINE GLUCONATE 0.12 % MT SOLN
OROMUCOSAL | Status: AC
  Filled 2022-05-15: qty 15

## 2022-05-15 MED ORDER — HYDROMORPHONE HCL 1 MG/ML IJ SOLN
INTRAMUSCULAR | Status: DC | PRN
  Administered 2022-05-15: 1 mg via INTRAVENOUS

## 2022-05-15 MED ORDER — GABAPENTIN 600 MG PO TABS
600.0000 mg | ORAL_TABLET | Freq: Every morning | ORAL | Status: DC
  Administered 2022-05-16: 600 mg via ORAL
  Filled 2022-05-15: qty 1

## 2022-05-15 MED ORDER — BUPIVACAINE HCL (PF) 0.5 % IJ SOLN
INTRAMUSCULAR | Status: AC
  Filled 2022-05-15: qty 30

## 2022-05-15 MED ORDER — PANTOPRAZOLE SODIUM 40 MG PO TBEC
40.0000 mg | DELAYED_RELEASE_TABLET | Freq: Every day | ORAL | Status: DC
  Administered 2022-05-16: 40 mg via ORAL
  Filled 2022-05-15: qty 1

## 2022-05-15 MED ORDER — MIDAZOLAM HCL 2 MG/2ML IJ SOLN
INTRAMUSCULAR | Status: DC | PRN
  Administered 2022-05-15: 2 mg via INTRAVENOUS

## 2022-05-15 MED ORDER — SIMETHICONE 80 MG PO CHEW: 80.0000 mg | CHEWABLE_TABLET | Freq: Four times a day (QID) | ORAL | Status: DC | PRN

## 2022-05-15 MED ORDER — SUGAMMADEX SODIUM 500 MG/5ML IV SOLN
INTRAVENOUS | Status: DC | PRN
  Administered 2022-05-15: 200 mg via INTRAVENOUS

## 2022-05-15 MED ORDER — ROCURONIUM BROMIDE 100 MG/10ML IV SOLN
INTRAVENOUS | Status: DC | PRN
  Administered 2022-05-15: 70 mg via INTRAVENOUS

## 2022-05-15 MED ORDER — FAMOTIDINE 20 MG PO TABS: 20.0000 mg | ORAL_TABLET | Freq: Every day | ORAL | Status: DC

## 2022-05-15 MED ORDER — ONDANSETRON HCL 4 MG/2ML IJ SOLN: 4.0000 mg | Freq: Four times a day (QID) | INTRAMUSCULAR | Status: DC | PRN

## 2022-05-15 MED ORDER — GABAPENTIN 300 MG PO CAPS
900.0000 mg | ORAL_CAPSULE | Freq: Every day | ORAL | Status: DC
  Administered 2022-05-15: 900 mg via ORAL
  Filled 2022-05-15: qty 3

## 2022-05-15 MED ORDER — SODIUM CHLORIDE 0.9 % IR SOLN
Status: DC | PRN
  Administered 2022-05-15: 1000 mL via INTRAVESICAL

## 2022-05-15 MED ORDER — HYDROMORPHONE HCL 1 MG/ML IJ SOLN
INTRAMUSCULAR | Status: AC
  Filled 2022-05-15: qty 1

## 2022-05-15 MED ORDER — EPHEDRINE SULFATE (PRESSORS) 50 MG/ML IJ SOLN
INTRAMUSCULAR | Status: DC | PRN
  Administered 2022-05-15: 5 mg via INTRAVENOUS

## 2022-05-15 MED ORDER — DOCUSATE SODIUM 100 MG PO CAPS
100.0000 mg | ORAL_CAPSULE | Freq: Two times a day (BID) | ORAL | Status: DC
  Administered 2022-05-15: 100 mg via ORAL
  Filled 2022-05-15: qty 1

## 2022-05-15 MED ORDER — ENOXAPARIN SODIUM 40 MG/0.4ML IJ SOSY
PREFILLED_SYRINGE | INTRAMUSCULAR | Status: AC
  Filled 2022-05-15: qty 0.4

## 2022-05-15 MED ORDER — ROSUVASTATIN CALCIUM 20 MG PO TABS
40.0000 mg | ORAL_TABLET | Freq: Every day | ORAL | Status: DC
  Filled 2022-05-15: qty 2

## 2022-05-15 MED ORDER — MENTHOL 3 MG MT LOZG: 1.0000 | LOZENGE | OROMUCOSAL | Status: DC | PRN

## 2022-05-15 MED ORDER — NITROGLYCERIN 0.4 MG SL SUBL: 0.4000 mg | SUBLINGUAL_TABLET | SUBLINGUAL | Status: DC | PRN

## 2022-05-15 MED ORDER — BUPIVACAINE HCL 0.5 % IJ SOLN
INTRAMUSCULAR | Status: DC | PRN
  Administered 2022-05-15: 16 mL

## 2022-05-15 MED ORDER — ONDANSETRON HCL 4 MG/2ML IJ SOLN
INTRAMUSCULAR | Status: DC | PRN
  Administered 2022-05-15 (×2): 4 mg via INTRAVENOUS

## 2022-05-15 MED ORDER — OXYCODONE-ACETAMINOPHEN 5-325 MG PO TABS
2.0000 | ORAL_TABLET | Freq: Four times a day (QID) | ORAL | Status: DC | PRN
  Administered 2022-05-15 – 2022-05-16 (×3): 2 via ORAL
  Filled 2022-05-15 (×2): qty 2

## 2022-05-15 MED ORDER — ESCITALOPRAM OXALATE 10 MG PO TABS: 20.0000 mg | ORAL_TABLET | Freq: Every day | ORAL | Status: DC

## 2022-05-15 MED ORDER — MIDAZOLAM HCL 2 MG/2ML IJ SOLN
INTRAMUSCULAR | Status: AC
  Filled 2022-05-15: qty 2

## 2022-05-15 MED ORDER — PHENYLEPHRINE HCL-NACL 20-0.9 MG/250ML-% IV SOLN
INTRAVENOUS | Status: DC | PRN
  Administered 2022-05-15: 25 ug/min via INTRAVENOUS

## 2022-05-15 MED ORDER — FENTANYL CITRATE (PF) 100 MCG/2ML IJ SOLN
INTRAMUSCULAR | Status: DC | PRN
  Administered 2022-05-15: 50 ug via INTRAVENOUS

## 2022-05-15 MED ORDER — OXYCODONE-ACETAMINOPHEN 5-325 MG PO TABS
1.0000 | ORAL_TABLET | Freq: Four times a day (QID) | ORAL | Status: DC | PRN
  Filled 2022-05-15 (×2): qty 1

## 2022-05-15 MED ORDER — INSULIN ASPART 100 UNIT/ML IJ SOLN
0.0000 [IU] | Freq: Three times a day (TID) | INTRAMUSCULAR | Status: DC
  Administered 2022-05-15: 5 [IU] via SUBCUTANEOUS
  Filled 2022-05-15: qty 1

## 2022-05-15 MED ORDER — ACETAMINOPHEN 10 MG/ML IV SOLN
INTRAVENOUS | Status: DC | PRN
  Administered 2022-05-15: 1000 mg via INTRAVENOUS

## 2022-05-15 SURGICAL SUPPLY — 72 items
ANCHOR TIS RET SYS 235ML (MISCELLANEOUS) IMPLANT
BAG URINE DRAIN 2000ML AR STRL (UROLOGICAL SUPPLIES) ×2 IMPLANT
BASIN KIT SINGLE STR (MISCELLANEOUS) ×2 IMPLANT
BLADE SURG SZ11 CARB STEEL (BLADE) ×2 IMPLANT
CANNULA REDUC XI 12-8 STAPL (CANNULA) ×2
CANNULA REDUCER 12-8 DVNC XI (CANNULA) IMPLANT
CATH FOLEY 2WAY  5CC 16FR (CATHETERS) ×2
CATH URTH 16FR FL 2W BLN LF (CATHETERS) ×2 IMPLANT
COVER MAYO STAND REUSABLE (DRAPES) ×2 IMPLANT
COVER TIP SHEARS 8 DVNC (MISCELLANEOUS) ×2 IMPLANT
COVER TIP SHEARS 8MM DA VINCI (MISCELLANEOUS) ×2
COVER WAND RF STERILE (DRAPES) ×2 IMPLANT
DERMABOND ADVANCED .7 DNX12 (GAUZE/BANDAGES/DRESSINGS) ×2 IMPLANT
DRAPE 3/4 80X56 (DRAPES) ×2 IMPLANT
DRAPE ARM DVNC X/XI (DISPOSABLE) ×6 IMPLANT
DRAPE COLUMN DVNC XI (DISPOSABLE) ×2 IMPLANT
DRAPE DA VINCI XI ARM (DISPOSABLE) ×6
DRAPE DA VINCI XI COLUMN (DISPOSABLE) ×2
DRAPE UNDER BUTTOCK W/FLU (DRAPES) ×2 IMPLANT
ELECT REM PT RETURN 9FT ADLT (ELECTROSURGICAL) ×2
ELECTRODE REM PT RTRN 9FT ADLT (ELECTROSURGICAL) ×2 IMPLANT
GLOVE BIO SURGEON STRL SZ7 (GLOVE) ×4 IMPLANT
GLOVE SURG UNDER POLY LF SZ7.5 (GLOVE) ×6 IMPLANT
GOWN STRL REUS W/ TWL LRG LVL3 (GOWN DISPOSABLE) ×6 IMPLANT
GOWN STRL REUS W/TWL LRG LVL3 (GOWN DISPOSABLE) ×16
GRASPER SUT TROCAR 14GX15 (MISCELLANEOUS) IMPLANT
GYRUS RUMI II 2.5CM BLUE (DISPOSABLE)
GYRUS RUMI II 3.5CM BLUE (DISPOSABLE)
HEMOSTAT ARISTA ABSORB 3G PWDR (HEMOSTASIS) IMPLANT
IRRIGATION STRYKERFLOW (MISCELLANEOUS) IMPLANT
IRRIGATOR STRYKERFLOW (MISCELLANEOUS)
IRRIGATOR SUCT 8 DISP DVNC XI (IRRIGATION / IRRIGATOR) IMPLANT
IRRIGATOR SUCTION 8MM XI DISP (IRRIGATION / IRRIGATOR) ×2
IV NS 1000ML (IV SOLUTION) ×2
IV NS 1000ML BAXH (IV SOLUTION) ×2 IMPLANT
KIT IMAGING PINPOINTPAQ (MISCELLANEOUS) IMPLANT
KIT PINK PAD W/HEAD ARE REST (MISCELLANEOUS) ×2
KIT PINK PAD W/HEAD ARM REST (MISCELLANEOUS) ×2 IMPLANT
LABEL OR SOLS (LABEL) ×2 IMPLANT
MANIFOLD NEPTUNE II (INSTRUMENTS) ×2 IMPLANT
MANIPULATOR UTERINE 4.5 ZUMI (MISCELLANEOUS) IMPLANT
NEEDLE HYPO 22GX1.5 SAFETY (NEEDLE) ×2 IMPLANT
NS IRRIG 1000ML POUR BTL (IV SOLUTION) ×2 IMPLANT
OBTURATOR OPTICAL STANDARD 8MM (TROCAR) ×2
OBTURATOR OPTICAL STND 8 DVNC (TROCAR) ×2
OBTURATOR OPTICALSTD 8 DVNC (TROCAR) ×2 IMPLANT
PACK LAP CHOLECYSTECTOMY (MISCELLANEOUS) ×2 IMPLANT
PAD OB MATERNITY 4.3X12.25 (PERSONAL CARE ITEMS) ×2 IMPLANT
PAD PREP 24X41 OB/GYN DISP (PERSONAL CARE ITEMS) ×2 IMPLANT
RUMI II 3.0CM BLUE KOH-EFFICIE (DISPOSABLE) IMPLANT
RUMI II GYRUS 2.5CM BLUE (DISPOSABLE) IMPLANT
RUMI II GYRUS 3.5CM BLUE (DISPOSABLE) IMPLANT
SCISSORS METZENBAUM CVD 33 (INSTRUMENTS) IMPLANT
SCRUB CHG 4% DYNA-HEX 4OZ (MISCELLANEOUS) ×2 IMPLANT
SEAL CANN UNIV 5-8 DVNC XI (MISCELLANEOUS) ×6 IMPLANT
SEAL XI 5MM-8MM UNIVERSAL (MISCELLANEOUS) ×6
SEALER VESSEL DA VINCI XI (MISCELLANEOUS) ×2
SEALER VESSEL EXT DVNC XI (MISCELLANEOUS) IMPLANT
SET CYSTO W/LG BORE CLAMP LF (SET/KITS/TRAYS/PACK) IMPLANT
SOLUTION ELECTROLUBE (MISCELLANEOUS) ×2 IMPLANT
SPONGE T-LAP 18X18 ~~LOC~~+RFID (SPONGE) IMPLANT
SPONGE T-LAP 4X18 ~~LOC~~+RFID (SPONGE) IMPLANT
SURGILUBE 2OZ TUBE FLIPTOP (MISCELLANEOUS) ×2 IMPLANT
SUT MNCRL 4-0 (SUTURE) ×4
SUT MNCRL 4-0 27XMFL (SUTURE) ×4
SUT VICRYL 0 AB UR-6 (SUTURE) IMPLANT
SUTURE MNCRL 4-0 27XMF (SUTURE) ×2 IMPLANT
SYR 10ML LL (SYRINGE) ×2 IMPLANT
TOWEL OR 17X26 4PK STRL BLUE (TOWEL DISPOSABLE) ×2 IMPLANT
TRAP FLUID SMOKE EVACUATOR (MISCELLANEOUS) ×2 IMPLANT
TUBING EVAC SMOKE HEATED PNEUM (TUBING) ×2 IMPLANT
WATER STERILE IRR 500ML POUR (IV SOLUTION) ×2 IMPLANT

## 2022-05-15 NOTE — Progress Notes (Signed)
Day of Surgery Procedure(s) (LRB): XI ROBOTIC ASSISTED BILATERAL OOPHORECTOMY (Bilateral) CYSTOSCOPY (N/A)  Subjective: Patient reports no nausea, vomiting. Her pain is well controlled.     Objective: I have reviewed patient's vital signs and labs.  General: alert, cooperative, no distress, and oriented to place and time Resp: clear to auscultation bilaterally Cardio: regular rate and rhythm, S1, S2 normal, no murmur, click, rub or gallop GI: soft, non-tender; bowel sounds normal; no masses,  no organomegaly and incision: clean, dry, and intact Extremities: extremities normal, atraumatic, no cyanosis or edema and SCDs in place  Assessment: s/p Procedure(s): XI ROBOTIC ASSISTED BILATERAL OOPHORECTOMY (Bilateral) CYSTOSCOPY (N/A): stable  Plan: Advance diet Encourage ambulation Advance to PO medication All pertinent home medications ordered (SSI ordered).  Consider restarting Eliquis tomorrow if CBC stable.   LOS: 0 days   Prentice Docker, MD, [facility] 05/15/2022 5:30 PM

## 2022-05-15 NOTE — Transfer of Care (Signed)
Immediate Anesthesia Transfer of Care Note  Patient: Valerie Vazquez  Procedure(s) Performed: XI ROBOTIC ASSISTED BILATERAL OOPHORECTOMY (Bilateral: Pelvis) CYSTOSCOPY (Bladder)  Patient Location: PACU  Anesthesia Type:General  Level of Consciousness: awake, drowsy and patient cooperative  Airway & Oxygen Therapy: Patient Spontanous Breathing and Patient connected to face mask oxygen  Post-op Assessment: Report given to RN and Post -op Vital signs reviewed and stable  Post vital signs: Reviewed and stable  Last Vitals:  Vitals Value Taken Time  BP    Temp    Pulse 100 05/15/22 1206  Resp 18 05/15/22 1206  SpO2 96 % 05/15/22 1206  Vitals shown include unvalidated device data.  Last Pain:  Vitals:   05/15/22 1200  TempSrc:   PainSc: Asleep         Complications: No notable events documented.

## 2022-05-15 NOTE — Op Note (Signed)
Operative Note    Name: Valerie Vazquez  Date of Service: 05/15/2022  DOB: Sep 08, 1955  MRN: [id number]   Pre-Operative Diagnosis: Persistent bilateral ovarian cysts  Post-Operative Diagnosis: Persistent bilateral ovarian cysts  Procedures:  1. Robot assisted laparoscopic bilateral oophorectomy 2. Extensive lysis of adhesions (much greater than 40 minutes) 3. Cystoscopy  Primary Surgeon: Prentice Docker, MD   EBL: 50 mL   IVF: 900 mL   Urine output: 200 mL  Specimens:  1) Right ovary with cyst 2) Left ovary with cyst  Drains: none  Complications: None   Disposition: PACU   Condition: Stable   Findings:  1) Left ovary with cyst and densely adherent to left pelvic sidewall and sigmoid colon 2) Right ovary with cyst and densely adherent to right pelvic sidewall and sigmoid colon, as well as small bowel and bladder 3) on cystoscopy there was efflux of urine from the bilateral ureteral orifices and no evidence of damage to the bladder wall  Procedure Summary:  The patient was taken to the operating room where general anesthesia was administered and found to be adequate. She was placed in the dorsal supine lithotomy position in [place] stirrups and prepped and draped in usual sterile fashion. After a timeout was called an indwelling catheter was placed in her bladder.    Attention was turned to the abdomen where after injection of local anesthetic, an 8 mm supraumbilical incision was made with the scalpel. Entry into the abdomen was obtained via Optiview trocar technique (a blunt entry technique with camera visualization through the obturator upon entry). Verification of entry into the abdomen was obtained using opening pressures. The abdomen was insufflated with CO2. The camera was introduced through the trocar with verification of atraumatic entry.  Right and left abdominal entry sites were created after injection of local anesthetic about 8 cm away from the umbilical port in  accordance with the Intuitive manufacturer's recommendations.  The right port sites was 12 mm.  The left port site was 8 mm.  The intuitive trochars were introduced under intra-abdominal camera visualization without difficulty.  The XI robot was docked from the patient's left side in accordance with manufacturer's recommendations.  Clearance from the patient's legs was ensured.  The camera was introduced through the supraumbilical port and attached to arm 3.  Arm 4 was attached to the right lower quadrant port and the forced bipolar forceps were placed under direct intra-abdominal camera visualization through this port.  Through arm 2 the vessel sealer was passed under direct intra-abdominal camera visualization without difficulty.  Attention was turned to the pelvis with the above-noted findings.  The left ovary was ultimately identified with all of its attendant adhesions.  Careful entry into the retroperitoneal space was accomplished above the pelvic brim and tissue dissection was carried down to the level of the ovary.  Continued dissection of tissue away from the ovary was accomplished.  Ultimately, the left infundibulopelvic ligament was identified and skeletonized.  Using the vessel sealer the vessels were cauterized and transected.  Continued dissection was necessary to completely free the ovary from the left pelvic sidewall, the bladder, and the sigmoid colon.  The ovary was densely adherent to these areas and required multiple small steps in order to successfully free the ovary.  Additionally, the vessels and ureter on the left side were carefully identified and dissected away.  Once the specimen was liberated it was placed in an Endo Catch bag and remained within the abdominal cavity for the rest of  the surgery.  A similar procedure was carried out on the right side with more obvious visualization of the ureter possible.  This required very careful dissection of the the ovary from the pelvic  sidewall peritoneum, the bladder, the sigmoid colon and small bowel.  Once the infundibulopelvic ligament was identified and skeletonized it was cauterized and transected using the vessel sealer.  The rest of the ovary was carefully and methodically dissected away from all of its many adhesions until it was finally liberated.  It was also placed in a separate Endo Catch bag.  Hemostasis was verified at the resection areas.  Arista 3 g was placed over these areas to ensure ongoing hemostasis.  Each specimen was removed separately through the 12 mm skin incision intact in the bag.  Attention was turned to the cystoscopy.  The Foley catheter was removed.  The 30 degree cystoscope was gently introduced through the urethra.  Approximately 200 mL of saline was used to backfill the bladder.  There were no defects noted in the bladder wall and abundant efflux of clear urine was noted from the bilateral ureteral orifices.  The bladder was drained of all saline using the cystoscope.  The cystoscope was removed.  The vagina was swept to ensure no instruments or sponges remained.  Attention was returned to the abdomen where the pelvis was inspected and found to be hemostatic when pressure was lowered to 5 mmHg.  The 12 mm trocar was removed and the fascia was reapproximated using an interrupted 0 Vicryl stitch.  The abdomen was emptied of CO2.  Both remaining trocars were removed.  The skin was closed in a subcuticular fashion using 4-0 Monocryl.  Surgical skin glue was applied.  This surgery took about 2 hours and 15 minutes with about 1 hour and 45 minutes spent in the lysis of adhesions.  The patient tolerated the procedure well.  Sponge, lap, needle, and instrument counts were correct x 2.  VTE prophylaxis: Enoxaparin 40 mg [place] prior to the start of the surgery and SCDs were in place throughout the surgery. Antibiotic prophylaxis: none indicated and none given. She was awakened in the operating room and was taken  to the PACU in stable condition.   Prentice Docker, MD 05/15/2022 11:39 AM
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
## 2022-05-15 NOTE — Progress Notes (Signed)
Called patients boyfriend per patients request Marinell Blight [phone], no answer and mailbox is full.

## 2022-05-15 NOTE — Progress Notes (Signed)
Around or shortly after returning to PACU from CT Patient Answered orientation questions appropriately without hesitation. Currently sitting up in bed drinking a sprite. Dr Barbra Sarks at bedside and performed his on assessment and cleared her to leave PACU.

## 2022-05-15 NOTE — Anesthesia Preprocedure Evaluation (Signed)
Anesthesia Evaluation  Patient identified by MRN, date of birth, ID band Patient awake    Reviewed: Allergy & Precautions, H&P , NPO status , Patient's Chart, lab work & pertinent test results, reviewed documented beta blocker date and time   History of Anesthesia Complications Negative for: history of anesthetic complications  Airway Mallampati: III   Neck ROM: full    Dental  (+) Poor Dentition, Dental Advidsory Given   Pulmonary sleep apnea , pneumonia, resolved, former smoker,    Pulmonary exam normal        Cardiovascular Exercise Tolerance: Good hypertension, On Medications (-) angina+ CAD and + Past MI  (-) CABG Normal cardiovascular exam Rhythm:regular Rate:Normal     Neuro/Psych  Headaches, PSYCHIATRIC DISORDERS Anxiety Depression Schizophrenia  Neuromuscular disease    GI/Hepatic Neg liver ROS, GERD  Medicated,  Endo/Other  diabetesHypothyroidism   Renal/GU Renal disease  negative genitourinary   Musculoskeletal   Abdominal   Peds  Hematology  (+) Blood dyscrasia, anemia ,   Anesthesia Other Findings Past Medical History: 10/23/2017: Acute right-sided low back pain with right-sided sciatica No date: Anemia No date: Bilateral renal cysts 09/28/2016: CAD (coronary artery disease) 10/14/2015: Chest pain No date: Chronic back pain     Comment:  "upper and lower" (09/19/2014) 01/29/2016: Chronic shoulder pain (Location of Primary Source of Pain)  (Left) 04/08/2016: Colitis 01/30/2017: Colon polyp 12/09/2014: Coronary vasospasm (HCC) No date: Depression     Comment:  "major" (09/19/2014) No date: DVT (deep venous thrombosis) (HCC) No date: Esophageal spasm 10/28/2013: Essential hypertension No date: Gallstones No date: Gastritis No date: Gastroenteritis No date: GERD (gastroesophageal reflux disease) No date: Headache     Comment:  "couple /month lately" (09/19/2014) 12/09/2016: Heart disease No date: High  blood pressure No date: High cholesterol 1985: History of blood transfusion     Comment:  related to hysterectomy No date: History of nuclear stress test     Comment:  Myoview 1/19:  EF 65, no ischemia or scar 09/19/2014: History of pulmonary embolus (PE) 01/29/2016: Hx of cervical spine surgery 02/13/2015: Hypercementosis     Comment:  Per patient diagnosed in [place]. Rule out Paget's               disease of the bone with blood work.  10/28/2013: Hyperlipidemia No date: Hypertension No date: IBS (irritable bowel syndrome) 01/29/2016: Lumbar central spinal stenosis (moderate at L4-5; mild at  L2-3 and L3-4) No date: Migraine     Comment:  "a few times/yr" (09/19/2014) 10/2010 X 3: Myocardial infarction [facility])     Comment:  "while hospitalized" 01/29/2016: Neck pain No date: Osteoarthritis     Comment:  "qwhere; mostly around my joints" (09/19/2014) 07/31/2015: Pleural effusion, bilateral 04/2014: Pneumonia No date: PTSD (post-traumatic stress disorder) 04/2001; 09/19/2014: Pulmonary embolism ([place])     Comment:  "after gallbladder OR; " No date: Schizoaffective disorder ([place]) 08/28/2016: Schizoaffective disorder, depressive type ([place]) No date: Sleep apnea     Comment:  "mild" (09/19/2014) No date: Snoring     Comment:  a. sleep study 5/16: No OSA 07/31/2015: SOB (shortness of breath) No date: Spinal stenosis No date: Spondylosis 08/27/2016: Suicide attempt ([place]) No date: Type II diabetes mellitus ([place])     Comment:  "dx'd in 2007; lost weight; no RX for ~ 4 yr now"               (09/19/2014) 02/25/2017: Wheezing Past Surgical History: 1985: ABDOMINAL HYSTERECTOMY 10/2012: ANTERIOR CERVICAL DECOMP/DISCECTOMY FUSION  Comment:  in [place] C5-C7  1985: APPENDECTOMY No date: BACK SURGERY No date: BREAST CYST EXCISION; Right YRS AGO: BREAST EXCISIONAL BIOPSY; Right     Comment:  NEG  2000's; 2009: CARDIAC CATHETERIZATION 2002: CHOLECYSTECTOMY 12/12/2016: COLONOSCOPY WITH PROPOFOL;  N/A     Comment:  Procedure: COLONOSCOPY WITH PROPOFOL;  Surgeon: [name], MD;  Location: [facility] ENDOSCOPY;  Service: Endoscopy;              Laterality: N/A; 02/17/2017: COLONOSCOPY WITH PROPOFOL; N/A     Comment:  Procedure: COLONOSCOPY WITH PROPOFOL;  Surgeon: [name], MD;  Location: [facility] ENDOSCOPY;  Service:               Gastroenterology;  Laterality: N/A; 02/17/2017: ESOPHAGOGASTRODUODENOSCOPY (EGD) WITH PROPOFOL; N/A     Comment:  Procedure: ESOPHAGOGASTRODUODENOSCOPY (EGD) WITH               PROPOFOL;  Surgeon: [name], MD;  Location: [facility]               ENDOSCOPY;  Service: Gastroenterology;  Laterality: N/A; 03/07/2019: ESOPHAGOGASTRODUODENOSCOPY (EGD) WITH PROPOFOL; N/A     Comment:  Procedure: ESOPHAGOGASTRODUODENOSCOPY (EGD) WITH               PROPOFOL;  Surgeon: [name], MD;  Location: [facility]               ENDOSCOPY;  Service: Gastroenterology;  Laterality: N/A; 12/1984: EXCISION/RELEASE BURSA HIP; Right 1985: HERNIA REPAIR 12/09/2014: LEFT HEART CATHETERIZATION WITH CORONARY ANGIOGRAM; N/A     Comment:  Procedure: LEFT HEART CATHETERIZATION WITH CORONARY               ANGIOGRAM;  Surgeon: Burnell Blanks, MD;                Location: [facility] CATH LAB;  Service: Cardiovascular;                Laterality: N/A; ~ 1968: TONSILLECTOMY AND ADENOIDECTOMY 04-06-2014: TOTAL HIP ARTHROPLASTY; Left 8119: UMBILICAL HERNIA REPAIR 09/4780: VENA CAVA FILTER PLACEMENT BMI    Body Mass Index: 34.06 kg/m     Reproductive/Obstetrics negative OB ROS                             Anesthesia Physical  Anesthesia Plan  ASA: 3  Anesthesia Plan: General   Post-op Pain Management:    Induction: Intravenous  PONV Risk Score and Plan: 4 or greater and Ondansetron and Dexamethasone  Airway Management Planned: Oral ETT  Additional Equipment:   Intra-op Plan:   Post-operative Plan: Extubation in OR  Informed Consent: I have  reviewed the patients History and Physical, chart, labs and discussed the procedure including the risks, benefits and alternatives for the proposed anesthesia with the patient or authorized representative who has indicated his/her understanding and acceptance.     Dental Advisory Given  Plan Discussed with: CRNA  Anesthesia Plan Comments: (Patient consented for risks of anesthesia including but not limited to:  - adverse reactions to medications - damage to eyes, teeth, lips or other oral mucosa - nerve damage due to positioning  - sore throat or hoarseness - Damage to heart, brain, nerves, lungs, other parts of body or loss of life  Patient voiced understanding.)  Anesthesia Quick Evaluation  

## 2022-05-15 NOTE — Progress Notes (Signed)
Patient arrived to the PACU after an uneventful anesthetic for her procedure on 05/14/22. Patient was in the PACU for an hour and was still sleepy and drowsy. Upon questioning, patient was A&O to her name only. Upon examination, patient had full strength in her hands and feet and no CN focal deficits. Pupil size were equal and reactive to light. Patient received pre op midazolam. 0.2 Flumazenil was given in PACU for possible benzo delirium. After the 10 minute mark, the patient show minimal signs of improving her sleepiness and drowsiness. CT head and labs were ordered. CT head was negative. Patient is getting admitted due to post op delirium. Will continue to monitor.

## 2022-05-15 NOTE — Progress Notes (Addendum)
Pt has been drowsy while in pacu.  After 1 hour in pacu drowsiness has not improved and pt mental status has not returned to baseline.  No focal deficits.  Pt able to state name but when asked what year it is or where she is she keeps repeating "[place]" or "cpap, cpap".  Pt unsure what was done today.  CBG was 213 in pacu.  Have given no medications in pacu.  Pt informed RN she is on cpap when asked and asked if her contact brenda could bring it; when called brenda she lives in [place] and reports pt is not on cpap.  Called dr Barbra Sarks and discussed mental status with him.  When asked if in pain will state " a little" but cannot state where or any further into.    1315-dr woodson at bedside. Romazicon given.   1327- informed dr Barbra Sarks no improvement in orientation.   Pt gives inappropriate answers for questions asked.  New orders placed  1332-to CT with monitor and this RN.  1345- dr Glennon Mac updated about pt

## 2022-05-15 NOTE — Anesthesia Procedure Notes (Signed)
Procedure Name: Intubation Date/Time: 05/15/2022 7:37 AM  Performed by: Kelton Pillar, CRNAPre-anesthesia Checklist: Patient identified, Emergency Drugs available, Suction available and Patient being monitored Patient Re-evaluated:Patient Re-evaluated prior to induction Oxygen Delivery Method: Circle system utilized Preoxygenation: Pre-oxygenation with 100% oxygen Induction Type: IV induction Ventilation: Mask ventilation without difficulty Laryngoscope Size: McGraph and 3 Grade View: Grade I Tube type: Oral Tube size: 7.5 mm Number of attempts: 1 Airway Equipment and Method: Stylet and Oral airway Placement Confirmation: ETT inserted through vocal cords under direct vision, positive ETCO2, breath sounds checked- equal and bilateral and CO2 detector Secured at: 21 cm Tube secured with: Tape Dental Injury: Teeth and Oropharynx as per pre-operative assessment

## 2022-05-16 ENCOUNTER — Encounter: Payer: Self-pay | Admitting: Obstetrics and Gynecology

## 2022-05-16 DIAGNOSIS — R079 Chest pain, unspecified: Secondary | ICD-10-CM | POA: Diagnosis not present

## 2022-05-16 DIAGNOSIS — Z86718 Personal history of other venous thrombosis and embolism: Secondary | ICD-10-CM | POA: Diagnosis not present

## 2022-05-16 DIAGNOSIS — I252 Old myocardial infarction: Secondary | ICD-10-CM | POA: Diagnosis not present

## 2022-05-16 DIAGNOSIS — Z7901 Long term (current) use of anticoagulants: Secondary | ICD-10-CM | POA: Diagnosis not present

## 2022-05-16 DIAGNOSIS — Z86711 Personal history of pulmonary embolism: Secondary | ICD-10-CM | POA: Diagnosis not present

## 2022-05-16 DIAGNOSIS — N83292 Other ovarian cyst, left side: Secondary | ICD-10-CM | POA: Diagnosis not present

## 2022-05-16 LAB — COMPREHENSIVE METABOLIC PANEL
ALT: 22 U/L (ref 0–44)
AST: 25 U/L (ref 15–41)
Albumin: 3.3 g/dL — ABNORMAL LOW (ref 3.5–5.0)
Alkaline Phosphatase: 92 U/L (ref 38–126)
Anion gap: 5 (ref 5–15)
BUN: 9 mg/dL (ref 8–23)
CO2: 27 mmol/L (ref 22–32)
Calcium: 9 mg/dL (ref 8.9–10.3)
Chloride: 110 mmol/L (ref 98–111)
Creatinine, Ser: 0.86 mg/dL (ref 0.44–1.00)
GFR, Estimated: >60 mL/min (ref >60)
Glucose, Bld: 157 mg/dL — ABNORMAL HIGH (ref 70–99)
Potassium: 3.8 mmol/L (ref 3.5–5.1)
Sodium: 142 mmol/L (ref 135–145)
Total Bilirubin: 0.5 mg/dL (ref 0.3–1.2)
Total Protein: 6.7 g/dL (ref 6.5–8.1)

## 2022-05-16 LAB — CBC
HCT: 35.1 % — ABNORMAL LOW (ref 36.0–46.0)
Hemoglobin: 11.5 g/dL — ABNORMAL LOW (ref 12.0–15.0)
MCH: 31.9 pg (ref 26.0–34.0)
MCHC: 32.8 g/dL (ref 30.0–36.0)
MCV: 97.5 fL (ref 80.0–100.0)
Platelets: 298 10*3/uL (ref 150–400)
RBC: 3.6 MIL/uL — ABNORMAL LOW (ref 3.87–5.11)
RDW: 13.3 % (ref 11.5–15.5)
WBC: 11.3 10*3/uL — ABNORMAL HIGH (ref 4.0–10.5)
nRBC: 0 % (ref 0.0–0.2)

## 2022-05-16 LAB — SURGICAL PATHOLOGY

## 2022-05-16 LAB — GLUCOSE, CAPILLARY: Glucose-Capillary: 134 mg/dL — ABNORMAL HIGH (ref 70–99)

## 2022-05-16 MED ORDER — OXYCODONE-ACETAMINOPHEN 5-325 MG PO TABS: 1.0000 | ORAL_TABLET | Freq: Four times a day (QID) | ORAL | 0 refills | Status: DC | PRN

## 2022-05-16 MED ORDER — ONDANSETRON 4 MG PO TBDP: 4.0000 mg | ORAL_TABLET | Freq: Four times a day (QID) | ORAL | 0 refills | Status: DC | PRN

## 2022-05-16 NOTE — Progress Notes (Signed)
Pt discharged home.  Discharge instructions, prescriptions and follow up appointment given to and reviewed with pt.  Pt verbalized understanding.  Escorted by auxillary. 

## 2022-05-16 NOTE — Discharge Summary (Signed)
DC Summary Discharge Summary   Patient ID: Valerie Vazquez [id number] 66 y.o. 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 [id number] here for surgical management of bilateral ovarian cysts.   She has a history of DVT/PE and COPD, history of myocardial infarction. She has been cleared by her PCP and cardiology.   History of Present Illness: 66 y.o. [name] female who has a long history of bilateral ovarian cysts.  She has intermittent right and left lower quadrant abdominal/pelvic pain.  She has been followed for these cysts without any significant change in them since 2019.      Her cyst history is as follows: Right ovary: Measurement 2.2 x 1.8 x 1.8 cm simple cyst (previously 1.9 x 1.7 x 1.7 cm in 2021 and 2.5 x 2.1 x 2.4 cm in 2020). It measured 1.9 x 1.9 x 1.8 cm on CT scan in 2018.   Left ovary: Measurement 2.2 x 1.7 x 2.5 cm simple cyst (previously 2.1 x 2.0 x 1.7 cm in 2021 and 1.9 x 1.7 x 2.2 cm in 2020. It measured 2.0 x 2.1 x 1.8 cm on the CT scan in 2018.    She has developed considerable anxiety about these cysts and would really like them to be removed.    She has had some recent chest pain. She was cleared by her Cardiologist, Dr. Lauree Chandler.   She also has a history of DVT/PE for which she takes Eliquis 5 mg PO BID. She has seen Dr. Caryl Bis who has recommended a conversion from Eliquis to Lovenox, with her final lovenox dose being the day before her surgery.   Past Medical History:  Diagnosis Date   Acute right-sided low back pain with right-sided sciatica 10/23/2017   Anemia    Anginal pain (HCC)    Bilateral ovarian cysts    Bilateral renal cysts    Bulimia nervosa 05/07/2021   CAD  (coronary artery disease) 09/28/2016   Chest pain 10/14/2015   Chronic back pain    "upper and lower" (09/19/2014)   Chronic shoulder pain (Location of Primary Source of Pain) (Left) 01/29/2016   Colitis 04/08/2016   Colon polyp 01/30/2017   Coronary vasospasm ([place]) 12/09/2014   DDD (degenerative disc disease), cervical    a.) s/p ACDF C5-C7   Depression    DVT (deep venous thrombosis) (HCC)    Esophageal spasm    Essential hypertension 10/28/2013   Gallstones    Gastritis    Gastroenteritis    GERD (gastroesophageal reflux disease)    Heart disease 12/09/2016   High blood pressure    High cholesterol    History of blood transfusion 1985   related to hysterectomy   History of left heart catheterization (LHC) 12/09/2014   a.) LHC 12/09/2014: EF 50%; normal coronaries.   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 Rule out Paget's disease of the bone with blood work.    Hyperlipidemia 10/28/2013   Hypertension    Hypothyroidism    IBS (irritable bowel syndrome)    Long term current use  of anticoagulant    a.) apixaban   Lumbar central spinal stenosis (moderate at L4-5; mild at L2-3 and L3-4) 01/29/2016   Migraine    "a few times/yr" (09/19/2014)   Myocardial infarction ([place]) 10/2010 X 3   "while hospitalized"   Neck pain 01/29/2016   Osteoarthritis    Pleural effusion, bilateral 07/31/2015   Pneumonia 04/2014   PONV (postoperative nausea and vomiting)    PTSD (post-traumatic stress disorder)    Pulmonary embolism ([place]) 04/2001; 09/19/2014   "after gallbladder OR; "   Schizoaffective disorder, depressive type ([place]) 08/28/2016   Sleep apnea    "mild" (09/19/2014)   Snoring    a. sleep study 5/16: No OSA   SOB (shortness of breath) 07/31/2015   Spinal stenosis    Spondylosis    Suicidal  ideation 08/27/2016   a.) seen in ED with approx 6 months of reported SI. Reported ingestion of "unknown pills" to end life; last ingestion was 3 days PTA; UDS (+) for TCAs. SI related to depression, friend (loss of friend), and social determinants   Type II diabetes mellitus ([place])    "dx'd in 2007; lost weight; no RX for ~ 4 yr now" (09/19/2014)    Past Surgical History:  Procedure Laterality Date   [place] DECOMP/DISCECTOMY FUSION  10/2012   in [place] C5-C7    [place] CYST EXCISION Right    BREAST EXCISIONAL BIOPSY Right YRS AGO   NEG   CARDIAC CATHETERIZATION   2000's; 2009   CHOLECYSTECTOMY  2002   COLONOSCOPY WITH PROPOFOL N/A 12/12/2016   Procedure: COLONOSCOPY WITH PROPOFOL;  Surgeon: [name], MD;  Location: [facility] ENDOSCOPY;  Service: Endoscopy;  Laterality: N/A;   COLONOSCOPY WITH PROPOFOL N/A 02/17/2017   Procedure: COLONOSCOPY WITH PROPOFOL;  Surgeon: [name], MD;  Location: [facility] ENDOSCOPY;  Service: Gastroenterology;  Laterality: N/A;   COLONOSCOPY WITH PROPOFOL N/A 11/04/2021   Procedure: COLONOSCOPY WITH PROPOFOL;  Surgeon: [name], MD;  Location: [facility] ENDOSCOPY;  Service: Gastroenterology;  Laterality: N/A;   ESOPHAGOGASTRODUODENOSCOPY (EGD) WITH PROPOFOL N/A 02/17/2017   Procedure: ESOPHAGOGASTRODUODENOSCOPY (EGD) WITH PROPOFOL;  Surgeon: [name], MD;  Location[facility] ENDOSCOPY;  Service: Gastroenterology;  Laterality: N/A;   ESOPHAGOGASTRODUODENOSCOPY (EGD) WITH PROPOFOL N/A 03/07/2019   Procedure: ESOPHAGOGASTRODUODENOSCOPY (EGD) WITH PROPOFOL;  Surgeon: [name], MD;  Location: [facility] ENDOSCOPY;  Service: Gastroenterology;  Laterality: N/A;   EXCISION/RELEASE BURSA HIP Right 12/1984   HERNIA REPAIR  1985   LEFT HEART CATHETERIZATION WITH CORONARY ANGIOGRAM N/A 12/09/2014   Procedure: LEFT HEART CATHETERIZATION WITH CORONARY ANGIOGRAM;  Surgeon: Burnell Blanks, MD;  Location: [facility]  CATH LAB;  Service: Cardiovascular;  Laterality: N/A;   TONSILLECTOMY AND ADENOIDECTOMY  ~ 1968   TOTAL HIP ARTHROPLASTY Left [id number]   UMBILICAL HERNIA REPAIR  1985   VENA CAVA FILTER PLACEMENT  03/2014    Allergies  Allergen Reactions   Abilify [Aripiprazole] Other (See Comments)    Jerking movement, slow motor skills   Augmentin [Amoxicillin-Pot Clavulanate] Diarrhea and Nausea And Vomiting   Bactrim [Sulfamethoxazole-Trimethoprim] Diarrhea   Ceftin [Cefuroxime Axetil] Diarrhea   Coconut (Cocos Nucifera)     Rash    Diclofenac Other (See Comments)     Muscle Pain   Dye Fdc Red [Red Dye]     "hair dye"   Indocin [Indomethacin] Other (See Comments)    Migraines    Linaclotide Diarrhea  Lisinopril Diarrhea and Other (See Comments)    Other reaction(s): Dizziness, Vomiting   Morphine Other (See Comments)    Chest Tightness   Morphine And Related Other (See Comments)    Chest tightness    Simvastatin Other (See Comments)    Muscle Pain   Sulfa Antibiotics Diarrhea   Sulfamethoxazole-Trimethoprim Diarrhea   Wellbutrin [Bupropion] Other (See Comments)    Jerking movement Jerking movement Slurred speech   Zetia [Ezetimibe]     Diarrhea    Quetiapine Fumarate Palpitations   Quetiapine Fumarate Palpitations    Social History   Tobacco Use   Smoking status: Former    Packs/day: 1.50    Years: 10.00    Total pack years: 15.00    Types: Cigarettes    Quit date: 04/12/1979    Years since quitting: 43.1   Smokeless tobacco: Never  Vaping Use   Vaping Use: Never used  Substance Use Topics   Alcohol use: No    Alcohol/week: 0.0 standard drinks of alcohol   Drug use: No    Family History  Problem Relation Age of Onset   Stroke Mother        Deceased   Lung cancer Father        Deceased   Healthy Daughter    Healthy Son        x 2   Other Son        Suicide   Cancer Brother    Heart attack Neg Hx     Hospital Course:  Admitted for surgery on 05/15/2022.  In the PACU she required an extended period before she was lucid. There was enough concern so that anesthesia ordered labs and a head CT, all of which were reassuring. She eventually did recover orientation and alertness.  She was monitored overnight. She had stable vitals and did well. By POD#1 she was voiding, ambulating, tolerating PO, and had adequate pain control with normal vital signs.  She denied chest pain, trouble breathing.    Discharge Exam: BP 139/79 (BP Location: Left Arm)   Pulse 93   Temp 98.8 F (37.1 C) (Oral)   Resp 18   Ht _0  (1.676 m)   Wt 101.6 kg   SpO2 95%   BMI 36.15 kg/m  Physical Exam Constitutional:      General: She is not in acute distress.    Appearance: Normal appearance. She is well-developed.  HENT:     Head: Normocephalic and atraumatic.  Eyes:     General: No scleral icterus.    Conjunctiva/sclera: Conjunctivae normal.  Cardiovascular:     Rate and Rhythm: Normal rate and regular rhythm.     Heart sounds: No murmur heard.    No friction rub. No gallop.  Pulmonary:     Effort: Pulmonary effort is normal. No respiratory distress.     Breath sounds: Normal breath sounds. No wheezing or rales.  Abdominal:     General: Bowel sounds are normal. There is no distension.     Palpations: Abdomen is soft. There is no mass.     Tenderness: There is no abdominal tenderness. There is no guarding or rebound.     Comments: Incisions: without erythema, induration, warmth, and tenderness. They are clean, dry, and intact.     Musculoskeletal:        General: Normal range of motion.     Cervical back: Normal range of motion and neck supple.  Neurological:     General: No focal  deficit present.     Mental Status: She is alert and oriented to person, place, and time.     Cranial Nerves: No cranial nerve deficit.  Skin:    General: Skin is warm and dry.     Findings: No erythema.  Psychiatric:        Mood and Affect: Mood normal.        Behavior:  Behavior normal.        Judgment: Judgment normal.      Condition at Discharge: Stable  Complications affecting treatment: None  Discharge Medications:  Allergies as of 05/16/2022       Reactions   Abilify [aripiprazole] Other (See Comments)   Jerking movement, slow motor skills   Augmentin [amoxicillin-pot Clavulanate] Diarrhea, Nausea And Vomiting   Bactrim [sulfamethoxazole-trimethoprim] Diarrhea   Ceftin [cefuroxime Axetil] Diarrhea   Coconut (cocos Nucifera)    Rash    Diclofenac Other (See Comments)    Muscle Pain   Dye Fdc Red [red Dye]    "hair dye"   Indocin [indomethacin] Other (See Comments)   Migraines    Linaclotide Diarrhea   Lisinopril Diarrhea, Other (See Comments)   Other reaction(s): Dizziness, Vomiting   Morphine Other (See Comments)   Chest Tightness   Morphine And Related Other (See Comments)   Chest tightness    Simvastatin Other (See Comments)   Muscle Pain   Sulfa Antibiotics Diarrhea   Sulfamethoxazole-trimethoprim Diarrhea   Wellbutrin [bupropion] Other (See Comments)   Jerking movement Jerking movement Slurred speech   Zetia [ezetimibe]    Diarrhea   Quetiapine Fumarate Palpitations   Quetiapine Fumarate Palpitations        Medication List     STOP taking these medications    enoxaparin 100 MG/ML injection Commonly known as: Lovenox   ferrous sulfate 324 MG Tbec   GOLDENSEAL ROOT PO   lidocaine 5 % Commonly known as: Lidoderm   OLANZapine 10 MG tablet Commonly known as: ZYPREXA   ondansetron 4 MG tablet Commonly known as: ZOFRAN   Super Thera Vite M Tabs       TAKE these medications    acetaminophen 500 MG tablet Commonly known as: TYLENOL Take 1 tablet (500 mg total) by mouth every 6 (six) hours as needed.   b complex vitamins capsule Take 1 capsule by mouth daily.   baclofen 10 MG tablet Commonly known as: LIORESAL TAKE ONE TABLET BY MOUTH THREE TIMES DAILY AS NEEDED FOR MUSCLE SPASMS (VIAL)   blood  glucose meter kit and supplies Kit Dispense based on patient and insurance preference. Use once daily fasting. Dx code E11.9.   dicyclomine 20 MG tablet Commonly known as: BENTYL TAKE ONE TABLET BY MOUTH THREE TIMES DAILY AS NEEDED FOR ABDOMINAL SPASMS (VIAL)   diphenhydrAMINE 25 MG tablet Commonly known as: BENADRYL Take 1 tablet (25 mg total) by mouth every 6 (six) hours.   Eliquis 5 MG Tabs tablet Generic drug: apixaban TAKE ONE TABLET BY MOUTH TWICE DAILY _0  & 5PM   escitalopram 20 MG tablet Commonly known as: LEXAPRO TAKE ONE TABLET BY MOUTH DAILY AT 9AM   ezetimibe 10 MG tablet Commonly known as: ZETIA Take 10 mg by mouth daily.   famotidine 20 MG tablet Commonly known as: PEPCID TAKE ONE TABLET BY MOUTH DAILY AT 9PM AT BEDTIME   furosemide 20 MG tablet Commonly known as: LASIX TAKE ONE TABLET BY MOUTH DAILY AS NEEDED for edema. (VIAL)   gabapentin 600 MG tablet Commonly known as:  NEURONTIN TAKE ONE TABLET BY MOUTH DAILY AT 9AM and TAKE 1 AND 1/2 TABLETS DAILY AT 9PM AT BEDTIME   GHT Blood Glucose Monitor w/Device Kit DISPENSE BASED ON PATIENT AND INSURANCE PREFERENCE. USE ONCE DAILY AS DIRECTED. DX CODE E11.9.   isosorbide mononitrate 30 MG 24 hr tablet Commonly known as: IMDUR TAKE ONE TABLET BY MOUTH DAILY AT 9AM   Jardiance 25 MG Tabs tablet Generic drug: empagliflozin TAKE ONE TABLET BY MOUTH DAILY AT 9AM   levothyroxine 25 MCG tablet Commonly known as: SYNTHROID TAKE 1 TABLET BY MOUTH BEFORE BREAKFAST 2-3 HOURS BEFORE TAKING ANY OTHER MEDICATION OR SUPPLEMENT   nitroGLYCERIN 0.4 MG SL tablet Commonly known as: NITROSTAT Place 1 tablet (0.4 mg total) under the tongue every 5 (five) minutes as needed for chest pain.   ondansetron 4 MG disintegrating tablet Commonly known as: ZOFRAN-ODT Take 1 tablet (4 mg total) by mouth every 6 (six) hours as needed for nausea.   OVER THE COUNTER MEDICATION 1,500 mg daily. [place] Root   OVER THE COUNTER  MEDICATION 200 mg daily. Magnesium Citrate Gummy   OVER THE COUNTER MEDICATION Power C 2 gummies daily   oxyCODONE-acetaminophen 5-325 MG tablet Commonly known as: PERCOCET/ROXICET Take 1 tablet by mouth every 6 (six) hours as needed (breakthrough pain).   pantoprazole 40 MG tablet Commonly known as: PROTONIX TAKE ONE TABLET BY MOUTH DAILY AT 9AM   potassium chloride SA 20 MEQ tablet Commonly known as: KLOR-CON M TAKE ONE TABLET BY MOUTH DAILY AS NEEDED TAKE WITH LASIX (VIAL)   rosuvastatin 40 MG tablet Commonly known as: CRESTOR Take 1 tablet (40 mg total) by mouth daily.   sitaGLIPtin 100 MG tablet Commonly known as: Januvia TAKE ONE TABLET BY MOUTH DAILY AT 9AM   Vitamin A 2400 MCG (8000 UT) Tabs Take by mouth daily.   vitamin E 180 MG (400 UNITS) capsule Take 400 Units by mouth daily.   ZINC 15 PO Take by mouth daily.         Follow-up arrangements:   Follow-up Information     Will Bonnet, MD. Go in 2 week(s).   Specialty: Obstetrics and Gynecology Why: Post-op follow up Contact information: [address] [place] [address] [phone]                  Discharge Disposition: Discharge disposition: 01-Home or Self Care    Signed: Prentice Docker, MD  05/16/2022 7:31 AM

## 2022-05-16 NOTE — Anesthesia Postprocedure Evaluation (Addendum)
Anesthesia Post Note  Patient: Valerie Vazquez  Procedure(s) Performed: XI ROBOTIC ASSISTED BILATERAL OOPHORECTOMY (Bilateral: Pelvis) CYSTOSCOPY (Bladder)  Patient location during evaluation: PACU Anesthesia Type: General Level of consciousness: awake and alert Pain management: pain level controlled Vital Signs Assessment: post-procedure vital signs reviewed and stable Respiratory status: spontaneous breathing, nonlabored ventilation, respiratory function stable and patient connected to nasal cannula oxygen Cardiovascular status: blood pressure returned to baseline and stable Postop Assessment: no apparent nausea or vomiting Anesthetic complications: no Comments: Patient was A&Ox4 sitting in bed. Patient states she feels much better and denies feeling confused or sedated. Patient instructed to go to the ER if she has any symptoms of stroke.   No notable events documented.   Last Vitals:  Vitals:   05/16/22 0346 05/16/22 0809  BP: 139/79 134/69  Pulse: 93 73  Resp: 18 18  Temp: 37.1 C 37.1 C  SpO2: 95% 95%    Last Pain:  Vitals:   05/16/22 0430  TempSrc:   PainSc: [place]

## 2022-05-19 ENCOUNTER — Ambulatory Visit: Payer: Self-pay

## 2022-05-19 NOTE — Patient Instructions (Signed)
Visit Information  Thank you for taking time to visit with me today. Please don't hesitate to contact me if I can be of assistance to you.   Following are the goals we discussed today:   Goals Addressed             This Visit's Progress    Patient stated:  Pre-procedure education/ diabetic education       Care Coordination Interventions: Evaluation of current treatment plan related to GYN ( oophorectomy) surgery and diabetes and patient's adherence to plan as established by provider Reviewed medications with patient and discussed importance of compliance Reviewed scheduled/upcoming provider appointments  Discussed signs/ symptoms of infection Evaluated patients pain level.  Advised to take pain medications as prescribed.  Advised to report severe increase in pain to provided if noted.  Confirmed patient has transportation to and from follow up appointment.  Discussed importance of managing blood sugars post surgery. Advised to notify provider for frequent high >200 or low <70 blood sugars.  Discussed potential problems with constipation.  Advised to remain hydrated/ fruits / vegetables as tolerated.            Our next appointment is by telephone on 06/12/22 at 2:30pm  Please call the care guide team at [phone] if you need to cancel or reschedule your appointment.   If you are experiencing a Mental Health or [place] or need someone to talk to, please call 1-800-273-TALK (toll free, 24 hour hotline)  Patient verbalizes understanding of instructions and care plan provided today and agrees to view in [place]. Active MyChart status and patient understanding of how to access instructions and care plan via MyChart confirmed with patient.     Quinn Plowman RN,BSN,CCM [place] [facility] [phone] direct line

## 2022-05-19 NOTE — Patient Outreach (Signed)
  Care Coordination   Follow Up Visit Note   05/19/2022 Name: Valerie Vazquez MRN: [id number] DOB: 30-Mar-1956  Valerie Vazquez is a 66 y.o. year old female who sees Caryl Bis, Angela Adam, MD for primary care. I spoke with  Valerie Vazquez by phone today.  What matters to the patients health and wellness today?  Post surgical management    Goals Addressed             This Visit's Progress    Patient stated:  Pre-procedure education/ diabetic education       Care Coordination Interventions: Evaluation of current treatment plan related to GYN ( oophorectomy) surgery and diabetes and patient's adherence to plan as established by provider Reviewed medications with patient and discussed importance of compliance Reviewed scheduled/upcoming provider appointments  Discussed signs/ symptoms of infection Evaluated patients pain level.  Advised to take pain medications as prescribed.  Advised to report severe increase in pain to provided if noted.  Confirmed patient has transportation to and from follow up appointment.  Discussed importance of managing blood sugars post surgery. Advised to notify provider for frequent high >200 or low <70 blood sugars.  Discussed potential problems with constipation.  Advised to remain hydrated/ fruits / vegetables as tolerated.            SDOH assessments and interventions completed:  No     Care Coordination Interventions Activated:  Yes  Care Coordination Interventions:  Yes, provided   Follow up plan: 06/12/22 at 2:30 pm  Encounter Outcome:  Pt. Visit Completed   Quinn Plowman RN,BSN,CCM [place] [phone] direct line

## 2022-05-21 ENCOUNTER — Other Ambulatory Visit: Payer: Self-pay

## 2022-05-21 DIAGNOSIS — R509 Fever, unspecified: Secondary | ICD-10-CM | POA: Insufficient documentation

## 2022-05-21 DIAGNOSIS — R1031 Right lower quadrant pain: Secondary | ICD-10-CM | POA: Diagnosis not present

## 2022-05-21 DIAGNOSIS — R1084 Generalized abdominal pain: Secondary | ICD-10-CM | POA: Insufficient documentation

## 2022-05-21 LAB — COMPREHENSIVE METABOLIC PANEL
ALT: 27 U/L (ref 0–44)
AST: 35 U/L (ref 15–41)
Albumin: 3.6 g/dL (ref 3.5–5.0)
Alkaline Phosphatase: 101 U/L (ref 38–126)
Anion gap: 8 (ref 5–15)
BUN: 6 mg/dL — ABNORMAL LOW (ref 8–23)
CO2: 24 mmol/L (ref 22–32)
Calcium: 9.3 mg/dL (ref 8.9–10.3)
Chloride: 110 mmol/L (ref 98–111)
Creatinine, Ser: 0.81 mg/dL (ref 0.44–1.00)
GFR, Estimated: >60 mL/min (ref >60)
Glucose, Bld: 139 mg/dL — ABNORMAL HIGH (ref 70–99)
Potassium: 3.6 mmol/L (ref 3.5–5.1)
Sodium: 142 mmol/L (ref 135–145)
Total Bilirubin: 0.4 mg/dL (ref 0.3–1.2)
Total Protein: 7.3 g/dL (ref 6.5–8.1)

## 2022-05-21 LAB — CBC
HCT: 38 % (ref 36.0–46.0)
Hemoglobin: 12.3 g/dL (ref 12.0–15.0)
MCH: 31.5 pg (ref 26.0–34.0)
MCHC: 32.4 g/dL (ref 30.0–36.0)
MCV: 97.2 fL (ref 80.0–100.0)
Platelets: 362 10*3/uL (ref 150–400)
RBC: 3.91 MIL/uL (ref 3.87–5.11)
RDW: 13.3 % (ref 11.5–15.5)
WBC: 9.5 10*3/uL (ref 4.0–10.5)
nRBC: 0 % (ref 0.0–0.2)

## 2022-05-21 LAB — LIPASE, BLOOD: Lipase: 20 U/L (ref 11–51)

## 2022-05-21 NOTE — ED Triage Notes (Signed)
First nurse Note:  Arrives with C/O right lower quadrant pain x 1 hour and fever.  States fever 100.2.  1000 mg tylenol taken 1 hour PTA.  Patient states she had a bilateral oophorectomy done on Thursday.  AAOx3.  Skin warm and dry. NAD

## 2022-05-21 NOTE — ED Triage Notes (Signed)
Pt to ED POV with husband, pt had bilateral oopherectomy on Thursday and began having bilateral lower quadrant abdominal pain about 1 hour ago. Pain is intermittent, sharp and shooting, currently 0/10.  States pain started today about 1-2 hours ago. States she took her temp and it was 100.4, took '1000mg'$  tylenol about 1 hour ago.

## 2022-05-22 ENCOUNTER — Emergency Department
Admission: EM | Admit: 2022-05-22 | Discharge: 2022-05-22 | Disposition: A | Discharge status: Home / Self Care | Payer: Medicare Other | Attending: Emergency Medicine | Admitting: Emergency Medicine

## 2022-05-22 ENCOUNTER — Emergency Department: Payer: Medicare Other

## 2022-05-22 DIAGNOSIS — R1031 Right lower quadrant pain: Secondary | ICD-10-CM | POA: Diagnosis not present

## 2022-05-22 DIAGNOSIS — R1084 Generalized abdominal pain: Secondary | ICD-10-CM

## 2022-05-22 LAB — URINALYSIS, ROUTINE W REFLEX MICROSCOPIC
Bilirubin Urine: NEGATIVE
Glucose, UA: >1000 mg/dL — AB
Hgb urine dipstick: NEGATIVE
Leukocytes,Ua: NEGATIVE
Nitrite: NEGATIVE
Protein, ur: NEGATIVE mg/dL
Specific Gravity, Urine: 1.015 (ref 1.005–1.030)
pH: 5 (ref 5.0–8.0)

## 2022-05-22 MED ORDER — IOHEXOL 350 MG/ML SOLN
125.0000 mL | Freq: Once | INTRAVENOUS | Status: AC | PRN
  Administered 2022-05-22: 125 mL via INTRAVENOUS

## 2022-05-22 NOTE — ED Notes (Signed)
Pt to room 17, this RN assumed care.

## 2022-05-22 NOTE — ED Notes (Signed)
Dc instructions given verbally and in writing... understanding voiced, siganture obtained.  Pt left in stable condition with family.

## 2022-05-22 NOTE — Discharge Instructions (Signed)
Your workup in the Emergency Department today was reassuring.  We did not find any specific abnormalities.  We recommend you drink plenty of fluids, take your regular medications and/or any new ones prescribed today, and follow up with the doctor(s) listed in these documents as recommended.  Return to the Emergency Department if you develop new or worsening symptoms that concern you.  

## 2022-05-22 NOTE — ED Provider Notes (Signed)
[facility] Provider Note    Event Date/Time   First MD Initiated Contact with Patient 05/22/22 [phone]     (approximate)   History   Abdominal Pain and Fever   HPI  Valerie Vazquez is a 66 y.o. female who presents for evaluation of intermittent right lower quadrant abdominal pain and a low-grade fever.  She reports that she had a bilateral oophorectomy by Dr. Glennon Mac approximately 1 week ago.  She has been doing okay although she has been suffering from some painful bowel movements that are harder than usual.  She said that tonight she was resting outside and she developed sharp abdominal pain that was most notable on the right lower quadrant of her abdomen.  No nausea or vomiting.  Because of the abdominal pain, she thought to check her temperature, and it was 100.4.  She rechecked it and it was again 100.4.  She denies any recent sore throat, chest pain, shortness of breath, vomiting, and dysuria.  She called the OB/GYN clinic who recommended that she go to the emergency department for additional evaluation.  She said that the pain is not constant, comes and goes, it is sharp when it occurs, and she said that it feels new as of this evening.  She has had some generalized soreness after the surgery but otherwise seems to have been recovering well.     Physical Exam   Triage Vital Signs: ED Triage Vitals  Enc Vitals Group     BP 05/21/22 1715 118/78     Pulse Rate 05/21/22 1715 92     Resp 05/21/22 1715 16     Temp 05/21/22 1715 99.3 F (37.4 C)     Temp Source 05/21/22 1715 Oral     SpO2 05/21/22 1715 94 %     Weight 05/21/22 1716 102 kg (224 lb 13.9 oz)     Height 05/21/22 1716 1.676 m ('5\' 6"'$ )     Head Circumference --      Peak Flow --      Pain Score 05/21/22 1715 0     Pain Loc --      Pain Edu? --      Excl. in [place]? --     Most recent vital signs: Vitals:   05/22/22 0056 05/22/22 0230  BP: (!) 165/81 (!) 133/52  Pulse: 74 85  Resp: 19 18   Temp: 98.8 F (37.1 C) 98.3 F (36.8 C)  SpO2: 97% 93%     General: Awake, no distress.  Generally well-appearing. CV:  Good peripheral perfusion.  Heart sounds. Resp:  Normal effort.  Lungs clear to auscultation. Abd:  No distention.  Well-appearing surgical scars, no surrounding erythema, no wound dehiscence.  Minimal tenderness to palpation of the abdomen although seems worse on the right side without any localized peritonitis.  No guarding. Other:  Mood and affect are normal and appropriate under the circumstances.   ED Results / Procedures / Treatments   Labs (all labs ordered are listed, but only abnormal results are displayed) Labs Reviewed  COMPREHENSIVE METABOLIC PANEL - Abnormal; Notable for the following components:      Result Value   Glucose, Bld 139 (*)    BUN 6 (*)    All other components within normal limits  URINALYSIS, ROUTINE W REFLEX MICROSCOPIC - Abnormal; Notable for the following components:   Glucose, UA >1,000 (*)    Ketones, ur TRACE (*)    All other components within normal limits  LIPASE, BLOOD  CBC     RADIOLOGY I viewed and interpreted the patient's CT of the abdomen and pelvis with IV contrast.  See hospital course for details, and in short, no significant acute findings.    PROCEDURES:  Critical Care performed: No  Procedures   MEDICATIONS ORDERED IN ED: Medications  iohexol (OMNIPAQUE) 350 MG/ML injection 125 mL (125 mLs Intravenous Contrast Given 05/22/22 0210)     IMPRESSION / MDM / ASSESSMENT AND PLAN / ED COURSE  I reviewed the triage vital signs and the nursing notes.                              Differential diagnosis includes, but is not limited to, constipation, metabolic or electrolyte abnormality, postoperative infection including the possibility of intra-abdominal/pelvic abscess, UTI, pneumonia, viral illness . Patient's presentation is most consistent with acute presentation with potential threat to life or bodily  function.  Vital signs are stable other than hypertension.  She has been consistently afebrile over 8 hours in the emergency department and she is not tachycardic.  Labs/studies ordered include CMP, urinalysis, lipase, CBC.  Labs are all within normal limits other than glucosuria with slight ketones.  She has no leukocytosis and no evidence of kidney dysfunction or electrolyte abnormality.  No evidence of pancreatitis.  However, given that it has been approximately 1 week since her surgery, it is possible that she is developing an abscess or post surgical infection.  It is unusual that she would have a temperature reading at home of 100.4 and she took Tylenol afterwards which could explain why the temperature improved although it is still better after 8 hours.  Although most likely she is suffering from some intermittent cramping due to constipation in the setting of recent anesthesia and pain medication, a missed intra-abdominal infection could lead to serious health issues and acute decompensation.  I ordered CT of the abdomen and pelvis with IV contrast to further assess.  If her CT scan is reassuring, anticipate discharge with outpatient follow-up.   Clinical Course as of 05/22/22 0323  Thu May 22, 2022  0312 CT ABDOMEN PELVIS W CONTRAST I viewed and interpreted the patient's CT of the abdomen and pelvis.  I did not see any specific abnormalities, certainly no sign of postoperative infection/abscess.  I reviewed the radiology report which commented on some periadnexal stranding on both sides.  I called and spoke by phone with Dr. Sherrye Payor the radiologist.  He did not have the background information that the patient had just had a bilateral oophorectomy.  He feels that the colitis is likely from slow transit after anesthesia and pain medicine and that the periadnexal inflammation and stranding is from the surgery and not representative of an acute infection.  I reassessed the patient.  She has  been sleeping comfortably.  She is reassured by the results and is comfortable with the plan for discharge and outpatient follow-up.  She and her husband agree with the plan and I gave my usual and customary return precautions. [CF]    Clinical Course User Index [CF] Hinda Kehr, MD     FINAL CLINICAL IMPRESSION(S) / ED DIAGNOSES   Final diagnoses:  Generalized abdominal pain     Rx / DC Orders   ED Discharge Orders     None        Note:  This document was prepared using Dragon voice recognition software and may include unintentional dictation  errors.   Hinda Kehr, MD 05/22/22 [phone]

## 2022-05-22 NOTE — ED Notes (Signed)
Signature pad doesn't work.  Verbal understanding stated.

## 2022-05-23 ENCOUNTER — Telehealth: Payer: Self-pay

## 2022-05-23 NOTE — Telephone Encounter (Signed)
Helene Kelp called from [place] to state patient is scheduled for a dental procedure on 05/27/2022, and the medical clearance they have on file for the patient states patient is not on plavix, but is on Eliquis, and Eliquis would need to be stopped two days prior to extraction.  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  [facility]'s fax number is:  [phone]  Helene Kelp will be available by phone today until 2pm.

## 2022-05-23 NOTE — Telephone Encounter (Signed)
Letter about the Eliquis and Lovenox was faxed to [facility] today. Confirmation given.  Soraiya Ahner,cma

## 2022-05-26 ENCOUNTER — Telehealth: Payer: Self-pay

## 2022-05-26 NOTE — Telephone Encounter (Signed)
Patient is having he dentist extractions tomorrow and they need a updated letter in the chart about the eliquis and lovenox, I think the surgery was rescheduled from a earlier date and they need a new letter faxed to [facility] at [phone], I told the nurse I though the instructions would be the same but the dates changed.  I called the dentist office and informed them that the patient took her eliquis last night and has to be off of it three days and to reschedule the patient and we will fax another clearance letter also.  Valli Randol,cma

## 2022-05-27 NOTE — Telephone Encounter (Signed)
I called the patient and she stated her new dentist date is October 2,2023.  I informed her that a new letter would be sent to her mychart and faxed to the dentist office and she understood.  Grayton Lobo,cma

## 2022-05-27 NOTE — Telephone Encounter (Signed)
Can you find out when they rescheduled her for? I can't change the dates on the letter until I know the surgery date. Thanks.

## 2022-05-28 NOTE — Telephone Encounter (Signed)
I called and LVM for patient to call back. Latonga Ponder,cma

## 2022-05-28 NOTE — Telephone Encounter (Signed)
I spoke with [place] [facility] [employer] and they received the fax and everything is good to go for the patient.  Valori Hollenkamp,cma

## 2022-05-28 NOTE — Telephone Encounter (Signed)
Valerie Vazquez called from [facility]. She stated that the letter they received was not current. She stated they needed a letter with the current dates the patient will be having her procedure. She also stated that the patient was on Plavix. However, the patient is shown to be taking Eliquis.

## 2022-05-28 NOTE — Telephone Encounter (Signed)
Patient returned office phone call. Patient was advised that the letter was faxed to dentist and a copy is up front for her to pick up.

## 2022-05-28 NOTE — Telephone Encounter (Signed)
Letter completed. Please fax to the dentist. Please make sure the patient gets a copy as well and understands the instructions in the letter. Please see if she got the lovenox that was previously sent to her pharmacy and if she still has it. If she does not have it I will need to send a new prescription in for her.

## 2022-05-29 ENCOUNTER — Encounter: Payer: Self-pay | Admitting: Family Medicine

## 2022-05-29 ENCOUNTER — Other Ambulatory Visit: Payer: Self-pay | Admitting: Family Medicine

## 2022-05-30 ENCOUNTER — Other Ambulatory Visit: Payer: Self-pay

## 2022-05-30 ENCOUNTER — Other Ambulatory Visit: Payer: Self-pay | Admitting: Family Medicine

## 2022-05-30 DIAGNOSIS — E785 Hyperlipidemia, unspecified: Secondary | ICD-10-CM

## 2022-05-30 DIAGNOSIS — Z86718 Personal history of other venous thrombosis and embolism: Secondary | ICD-10-CM

## 2022-05-30 MED ORDER — ENOXAPARIN SODIUM 100 MG/ML IJ SOSY: 100.0000 mg | PREFILLED_SYRINGE | Freq: Two times a day (BID) | INTRAMUSCULAR | 0 refills | Status: DC

## 2022-05-30 NOTE — Telephone Encounter (Signed)
Patient called and stated it was to late to take the Lovenox, the Lovenox was never sent to the patients pharmacy by the provider and she cancelled her surgery appointment, I called and spoke with her and I informed her that I would send the RX for Lovenox to the pharmacy and for her to call me with a new surgery date and we will resend the letter with a new date for her surgery and she understood. I sent in the Rx for the Lovenox today.  Aadvik Roker,cma

## 2022-05-30 NOTE — Telephone Encounter (Signed)
Patient needs to start Lovenox today, she has not received it.

## 2022-05-30 NOTE — Telephone Encounter (Signed)
Patient called and said she was suppose to start lovenox this morning.

## 2022-05-31 DIAGNOSIS — E119 Type 2 diabetes mellitus without complications: Secondary | ICD-10-CM | POA: Diagnosis not present

## 2022-05-31 DIAGNOSIS — E039 Hypothyroidism, unspecified: Secondary | ICD-10-CM | POA: Diagnosis not present

## 2022-06-01 ENCOUNTER — Other Ambulatory Visit: Payer: Self-pay | Admitting: Family Medicine

## 2022-06-03 ENCOUNTER — Telehealth: Payer: Self-pay

## 2022-06-03 NOTE — Telephone Encounter (Signed)
        Patient  visited [place] on 9/21 Telephone encounter attempt :  1st  A HIPAA compliant voice message was left requesting a return call.  Instructed patient to call back .    [facility], Care Management  [phone] [address], [place], [place] [address] Phone: [phone] Email: [email or web address]

## 2022-06-03 NOTE — Telephone Encounter (Signed)
Patient states she would like for Dr. [name] to know that her dental procedure will be 06/16/2022 (Monday) at 10am.  Patient states she needs to discontinue her  ELIQUIS 5 MG TABS tablet on 06/13/2022 (Friday). Patient states she will start her lovenox injections on 06/14/2022 (Saturday).

## 2022-06-04 NOTE — Telephone Encounter (Signed)
Calls would not go through I will try later.  Dalisha Shively,cma

## 2022-06-04 NOTE — Telephone Encounter (Signed)
Noted regarding the date of her dental procedure. The dates that that she is to be off the eliquis and on the lovenox are not correct based on what is in the below message. The patient comes in for a visit on 10/9 and I will go over the appropriate dates with her when she is in the office. Please let her know this.

## 2022-06-05 NOTE — Telephone Encounter (Signed)
Called and spoke with the patient and informed her that the provider stated he would discuss the Lovenox and eliquis bridge with her when she comes in on Monday and she understood.  Valerie Vazquez,cma

## 2022-06-09 ENCOUNTER — Ambulatory Visit (INDEPENDENT_AMBULATORY_CARE_PROVIDER_SITE_OTHER): Payer: Medicare Other | Admitting: Family Medicine

## 2022-06-09 ENCOUNTER — Encounter: Payer: Self-pay | Admitting: Family Medicine

## 2022-06-09 DIAGNOSIS — R229 Localized swelling, mass and lump, unspecified: Secondary | ICD-10-CM

## 2022-06-09 DIAGNOSIS — Z86711 Personal history of pulmonary embolism: Secondary | ICD-10-CM | POA: Diagnosis not present

## 2022-06-09 DIAGNOSIS — E119 Type 2 diabetes mellitus without complications: Secondary | ICD-10-CM | POA: Diagnosis not present

## 2022-06-09 LAB — POCT GLYCOSYLATED HEMOGLOBIN (HGB A1C): Hemoglobin A1C: 7.6 % — AB (ref 4.0–5.6)

## 2022-06-09 NOTE — Progress Notes (Signed)
Valerie Rumps, MD Phone: [phone]  Valerie Vazquez is a 66 y.o. female who presents today for follow-up.  History of PE: Patient is set to have a dental procedure.  She will have a Lovenox bridge.  She already has Lovenox on hand.  Diabetes: Her sugar was 156 this morning.  She is taking Jardiance and Januvia.  No polyuria or polydipsia.  No hypoglycemia.  Lump on stomach: Patient had ovaries removed in September.  She noticed a lump on her stomach about a week ago underneath one of the incision sites.  She notes it hurts intermittently.  She is having bowel movements most days.  She notes no change to her chronic intermittent nausea and vomiting.  She had a CT scan in the emergency department that did not reveal any hernia.  Social History   Tobacco Use  Smoking Status Former   Packs/day: 1.50   Years: 10.00   Total pack years: 15.00   Types: Cigarettes   Quit date: 04/12/1979   Years since quitting: 43.1  Smokeless Tobacco Never    Current Outpatient Medications on File Prior to Visit  Medication Sig Dispense Refill   acetaminophen (TYLENOL) 500 MG tablet Take 1 tablet (500 mg total) by mouth every 6 (six) hours as needed. 30 tablet 0   b complex vitamins capsule Take 1 capsule by mouth daily.     baclofen (LIORESAL) 10 MG tablet TAKE ONE TABLET BY MOUTH THREE TIMES DAILY AS NEEDED FOR MUSCLE SPASMS (VIAL) 60 tablet 11   blood glucose meter kit and supplies KIT Dispense based on patient and insurance preference. Use once daily fasting. Dx code E11.9. 1 each 0   Blood Glucose Monitoring Suppl (GHT BLOOD GLUCOSE MONITOR) w/Device KIT DISPENSE BASED ON PATIENT AND INSURANCE PREFERENCE. USE ONCE DAILY AS DIRECTED. DX CODE E11.9. 1 kit 0   dicyclomine (BENTYL) 20 MG tablet TAKE ONE TABLET BY MOUTH THREE TIMES DAILY AS NEEDED FOR ABDOMINAL SPASMS (VIAL) 270 tablet 11   diphenhydrAMINE (BENADRYL) 25 MG tablet Take 1 tablet (25 mg total) by mouth every 6 (six) hours. 12 tablet 0    ELIQUIS 5 MG TABS tablet TAKE ONE TABLET BY MOUTH TWICE DAILY $RemoveBef'@9AM'[id number]$  & 5PM 60 tablet 11   escitalopram (LEXAPRO) 20 MG tablet TAKE ONE TABLET BY MOUTH DAILY AT 9AM 90 tablet 2   ezetimibe (ZETIA) 10 MG tablet Take 10 mg by mouth daily.     famotidine (PEPCID) 20 MG tablet TAKE ONE TABLET BY MOUTH DAILY AT 9PM AT BEDTIME 90 tablet 11   furosemide (LASIX) 20 MG tablet TAKE ONE TABLET BY MOUTH DAILY AS NEEDED for edema. (VIAL) 90 tablet 11   gabapentin (NEURONTIN) 600 MG tablet TAKE ONE TABLET BY MOUTH DAILY AT 9AM and TAKE 1 AND 1/2 TABLETS DAILY AT 9PM AT BEDTIME 225 tablet 11   isosorbide mononitrate (IMDUR) 30 MG 24 hr tablet TAKE ONE TABLET BY MOUTH DAILY AT 9AM 90 tablet 2   JARDIANCE 25 MG TABS tablet TAKE ONE TABLET BY MOUTH DAILY AT 9AM 90 tablet 1   levothyroxine (SYNTHROID) 25 MCG tablet TAKE 1 TABLET BY MOUTH BEFORE BREAKFAST 2-3 HOURS BEFORE TAKING ANY OTHER MEDICATION OR SUPPLEMENT 90 tablet 3   nitroGLYCERIN (NITROSTAT) 0.4 MG SL tablet Place 1 tablet (0.4 mg total) under the tongue every 5 (five) minutes as needed for chest pain. 25 tablet 3   ondansetron (ZOFRAN-ODT) 4 MG disintegrating tablet Take 1 tablet (4 mg total) by mouth every 6 (six) hours  as needed for nausea. 20 tablet 0   OVER THE COUNTER MEDICATION 1,500 mg daily. Yellowdock Root     OVER THE COUNTER MEDICATION 200 mg daily. Magnesium Citrate Gummy     OVER THE COUNTER MEDICATION Power C 2 gummies daily     oxyCODONE-acetaminophen (PERCOCET/ROXICET) 5-325 MG tablet Take 1 tablet by mouth every 6 (six) hours as needed (breakthrough pain). 15 tablet 0   pantoprazole (PROTONIX) 40 MG tablet TAKE ONE TABLET BY MOUTH DAILY AT 9AM 72 tablet 11   potassium chloride SA (KLOR-CON M) 20 MEQ tablet TAKE ONE TABLET BY MOUTH DAILY AS NEEDED TAKE WITH LASIX (VIAL) 90 tablet 11   rosuvastatin (CRESTOR) 40 MG tablet TAKE 1 TABLET BY MOUTH EVERY DAY 90 tablet 1   sitaGLIPtin (JANUVIA) 100 MG tablet TAKE ONE TABLET BY MOUTH DAILY AT 9AM 90  tablet 1   Vitamin A 2400 MCG (8000 UT) TABS Take by mouth daily.     vitamin E 180 MG (400 UNITS) capsule Take 400 Units by mouth daily.     Zinc Sulfate (ZINC 15 PO) Take by mouth daily.     enoxaparin (LOVENOX) 100 MG/ML injection Inject 1 mL (100 mg total) into the skin every 12 (twelve) hours for 7 days. 14 mL 0   No current facility-administered medications on file prior to visit.     ROS see history of present illness  Objective  Physical Exam Vitals:   06/09/22 1350  BP: 118/78  Pulse: 89  Temp: 98.7 F (37.1 C)  SpO2: 95%    BP Readings from Last 3 Encounters:  06/09/22 118/78  05/22/22 (!) 144/78  05/16/22 134/69   Wt Readings from Last 3 Encounters:  06/09/22 224 lb (101.6 kg)  05/21/22 224 lb 13.9 oz (102 kg)  05/15/22 224 lb (101.6 kg)    Physical Exam Constitutional:      General: She is not in acute distress.    Appearance: She is not diaphoretic.  Cardiovascular:     Rate and Rhythm: Normal rate and regular rhythm.     Heart sounds: Normal heart sounds.  Pulmonary:     Effort: Pulmonary effort is normal.     Breath sounds: Normal breath sounds.  Abdominal:    Skin:    General: Skin is warm and dry.  Neurological:     Mental Status: She is alert.      Assessment/Plan: Please see individual problem list.  Problem List Items Addressed This Visit     Diabetes mellitus without complication (HCC) (Chronic)    Check A1c.  Patient will continue Januvia 100 mg daily and Jardiance 25 mg daily.      Relevant Orders   POCT HgB A1C   History of pulmonary embolus (PE) (Chronic)    Patient will be bridged on Lovenox prior to her dental procedure.  Her dental procedure is on 10/16.  She will take her last dose of Eliquis on the evening of 06/12/2022.  She will take a Lovenox injection in the morning and evening on 06/13/2022 and 06/14/2022.  She will take a Lovenox injection the morning of 06/15/2022.  She will not have any anticoagulation after that  until after the procedure.  At that time the dental surgeon can determine when she can resume her Eliquis.      Subcutaneous nodule    Likely scar tissue though could represent a hematoma.  No hernia on CT imaging.  I advised her to discuss this further with her surgeon  to see what they thought.  Discussed if it changed or did not start to improve we could consider an ultrasound of the area.       Return in about 3 months (around 09/09/2022) for Diabetes.   Valerie Rumps, MD [facility]

## 2022-06-09 NOTE — Patient Instructions (Addendum)
Nice to see you. Please follow the instructions for your Lovenox bridge. You will take your last dose of Eliquis on the evening of 06/12/2022.  You will take a Lovenox injection in the morning and evening on 06/13/2022 and 06/14/2022.  You will take a Lovenox injection the morning of 06/15/2022.  You can resume the Eliquis the day after the procedure as long as the dentist thinks this is safe.

## 2022-06-09 NOTE — Assessment & Plan Note (Signed)
Check A1c.  Patient will continue Januvia 100 mg daily and Jardiance 25 mg daily.

## 2022-06-09 NOTE — Assessment & Plan Note (Addendum)
Patient will be bridged on Lovenox prior to her dental procedure.  Her dental procedure is on 10/16.  She will take her last dose of Eliquis on the evening of 06/12/2022.  She will take a Lovenox injection in the morning and evening on 06/13/2022 and 06/14/2022.  She will take a Lovenox injection the morning of 06/15/2022.  She will not have any anticoagulation after that until after the procedure.  At that time the dental surgeon can determine when she can resume her Eliquis.

## 2022-06-09 NOTE — Assessment & Plan Note (Signed)
Likely scar tissue though could represent a hematoma.  No hernia on CT imaging.  I advised her to discuss this further with her surgeon to see what they thought.  Discussed if it changed or did not start to improve we could consider an ultrasound of the area.

## 2022-06-11 ENCOUNTER — Encounter: Payer: Self-pay | Admitting: Family Medicine

## 2022-06-12 ENCOUNTER — Ambulatory Visit: Payer: Self-pay

## 2022-06-12 DIAGNOSIS — E119 Type 2 diabetes mellitus without complications: Secondary | ICD-10-CM | POA: Diagnosis not present

## 2022-06-12 DIAGNOSIS — E039 Hypothyroidism, unspecified: Secondary | ICD-10-CM | POA: Diagnosis not present

## 2022-06-12 NOTE — Patient Outreach (Signed)
  Care Coordination   Follow Up Visit Note   06/12/2022 Name: Valerie Vazquez MRN: [id number] DOB: 05-22-1956  Valerie Vazquez is a 66 y.o. year old female who sees Caryl Bis, Angela Adam, MD for primary care. I spoke with  Valerie Vazquez by phone today.  What matters to the patients health and wellness today?  Patient states she has a follow up visit with her surgeon on tomorrow. She states she has developed a lump under her incision site and will discuss this at the surgeons visit.  Patient states her most recent Hgb A1c is 7.6 up from 7.1.  She states her primary care provider prescribed her Ozempic.  She states this was just done so she will pick up prescription later today.  Patient reports blood sugar range has been from 130's to 190's.  Denies any blood sugar readings <70.    Goals Addressed             This Visit's Progress    Patient stated:  Pre-procedure education/ diabetic education       Care Coordination Interventions: Evaluation of current treatment plan related to diabetes and patient's adherence to plan as established by provider Reviewed medications with patient and discussed importance of compliance Reviewed scheduled/upcoming provider appointments  Discussed importance of managing blood sugars post surgery. Advised to notify provider for frequent high >200 or low <70 blood sugars.  Discussed recent change in patients medication regime to include Ozempic Discussed importance of a balanced diabetic diet.  Education article sent to patient regarding diabetes and diet.            SDOH assessments and interventions completed:  No     Care Coordination Interventions Activated:  Yes  Care Coordination Interventions:  Yes, provided   Follow up plan: Follow up call scheduled for 07/17/22 at 2:30 pm    Encounter Outcome:  Pt. Visit Completed   Quinn Plowman RN,BSN,CCM [facility] [phone] direct line

## 2022-06-13 MED ORDER — OZEMPIC (0.25 OR 0.5 MG/DOSE) 2 MG/3ML ~~LOC~~ SOPN: PEN_INJECTOR | SUBCUTANEOUS | 1 refills | Status: DC

## 2022-06-14 ENCOUNTER — Other Ambulatory Visit: Payer: Self-pay | Admitting: Family Medicine

## 2022-06-14 DIAGNOSIS — K582 Mixed irritable bowel syndrome: Secondary | ICD-10-CM

## 2022-06-20 ENCOUNTER — Other Ambulatory Visit: Payer: Self-pay | Admitting: Family Medicine

## 2022-06-20 DIAGNOSIS — E063 Autoimmune thyroiditis: Secondary | ICD-10-CM

## 2022-06-23 NOTE — Telephone Encounter (Signed)
Pt called wanting to see how much to inject for the ozempic, where to inject and how often

## 2022-07-01 DIAGNOSIS — E119 Type 2 diabetes mellitus without complications: Secondary | ICD-10-CM | POA: Diagnosis not present

## 2022-07-01 DIAGNOSIS — E039 Hypothyroidism, unspecified: Secondary | ICD-10-CM | POA: Diagnosis not present

## 2022-07-03 ENCOUNTER — Ambulatory Visit: Payer: Medicare Other

## 2022-07-03 ENCOUNTER — Other Ambulatory Visit: Payer: Self-pay | Admitting: Family Medicine

## 2022-07-03 DIAGNOSIS — E119 Type 2 diabetes mellitus without complications: Secondary | ICD-10-CM

## 2022-07-03 NOTE — Progress Notes (Signed)
Patient came in today for [place] teaching.  Patient brought in her Ozempic medication and I taught her how to administer the medication in the abdomen area by administering her medication to her in her abdomen area.  She understood the instructions and I had her to repeat the instructions back to me.  Patient did well and had no questions after the teaching.  Lotta Frankenfield,cma

## 2022-07-04 ENCOUNTER — Telehealth: Payer: Self-pay

## 2022-07-04 DIAGNOSIS — M48061 Spinal stenosis, lumbar region without neurogenic claudication: Secondary | ICD-10-CM | POA: Diagnosis not present

## 2022-07-04 NOTE — Telephone Encounter (Signed)
Please find out when her spinal procedure is. Thanks.

## 2022-07-04 NOTE — Telephone Encounter (Signed)
Patient states she is having a spinal procedure and would like to speak with [place] regarding how to discontinue and recontinue her medications prior to and after procedure.  Patient states she has some of her enoxaparin (LOVENOX) 100 MG/ML injection (Expired) left from the last time she had a bridge.  Patient states she may not have enough and would like to verify this with [place].

## 2022-07-07 NOTE — Telephone Encounter (Signed)
Noted. Can you please find out what doses she has and how much she has? She really should not have any left over from prior surgeries as I have only sent in the needed amounts each time. How old are the doses of lovenox she has? Are they past their expiration date? Once I know the doses and quantity she has I can give directions and send in more if needed.

## 2022-07-07 NOTE — Telephone Encounter (Signed)
I called and spoke with the patient and her procedure is 07/23/2022 and patient stated she has a lot of Lovenox left from her last procedure.  [name],cma

## 2022-07-07 NOTE — Telephone Encounter (Signed)
Sorry, I need more information on what kind of surgery she is having and what kind of anesthesia she is having to figure out when she needs to stop the eliquis and bridge with the lovenox. We will need to request this information from the surgeons office. Can we fax them a records request? And then I can complete a letter for this to send to them as well.

## 2022-07-07 NOTE — Telephone Encounter (Signed)
I called and spoke with the patient and she stated she has 9 of the Lovenox left and they are 100 mg/ml and they expire 10/22/2023.  Dimitra Woodstock,cma

## 2022-07-08 NOTE — Telephone Encounter (Signed)
I called the patient and she stated she is having a spinal block for the pain she is having on 11/22/203 by Dr. Lamount Cranker at [place] on [address].  Ardean Simonich,cma

## 2022-07-12 DIAGNOSIS — E039 Hypothyroidism, unspecified: Secondary | ICD-10-CM | POA: Diagnosis not present

## 2022-07-12 DIAGNOSIS — E119 Type 2 diabetes mellitus without complications: Secondary | ICD-10-CM | POA: Diagnosis not present

## 2022-07-14 ENCOUNTER — Other Ambulatory Visit: Payer: Self-pay | Admitting: Family Medicine

## 2022-07-14 DIAGNOSIS — K582 Mixed irritable bowel syndrome: Secondary | ICD-10-CM

## 2022-07-15 IMAGING — MG MM DIGITAL DIAGNOSTIC UNILAT*R* W/ TOMO W/ CAD
4 series · 4 of 12 positions shown · non-contrast
Comparison: Previous exam(s).

CLINICAL DATA: Nonfocal RIGHT breast pain.

EXAM:
DIGITAL DIAGNOSTIC UNILATERAL RIGHT MAMMOGRAM WITH TOMO AND CAD

[R MLO synth-2D]
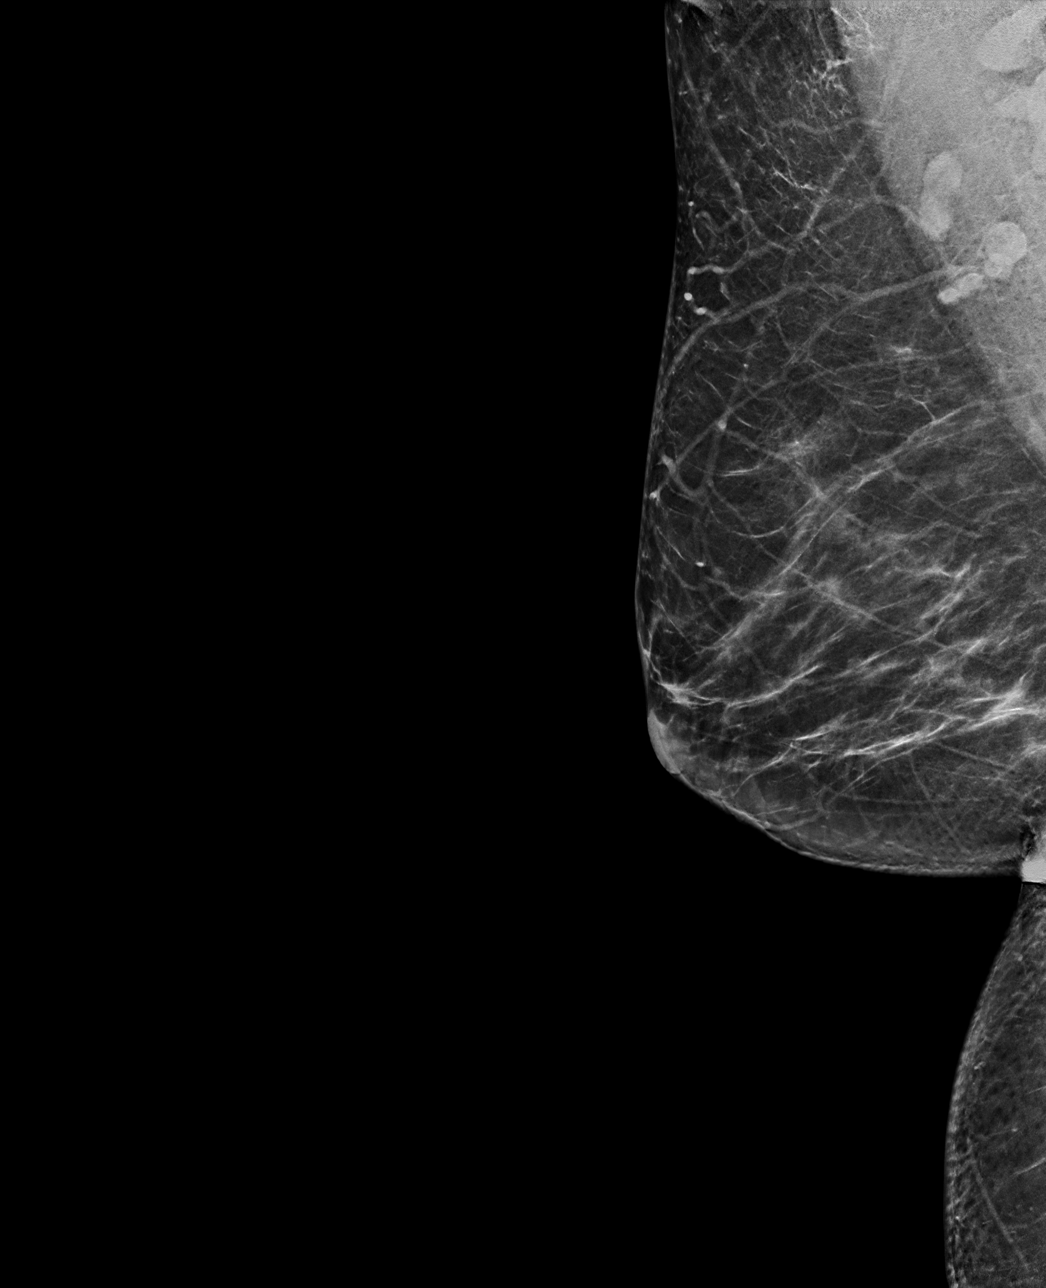

[R CC synth-2D]
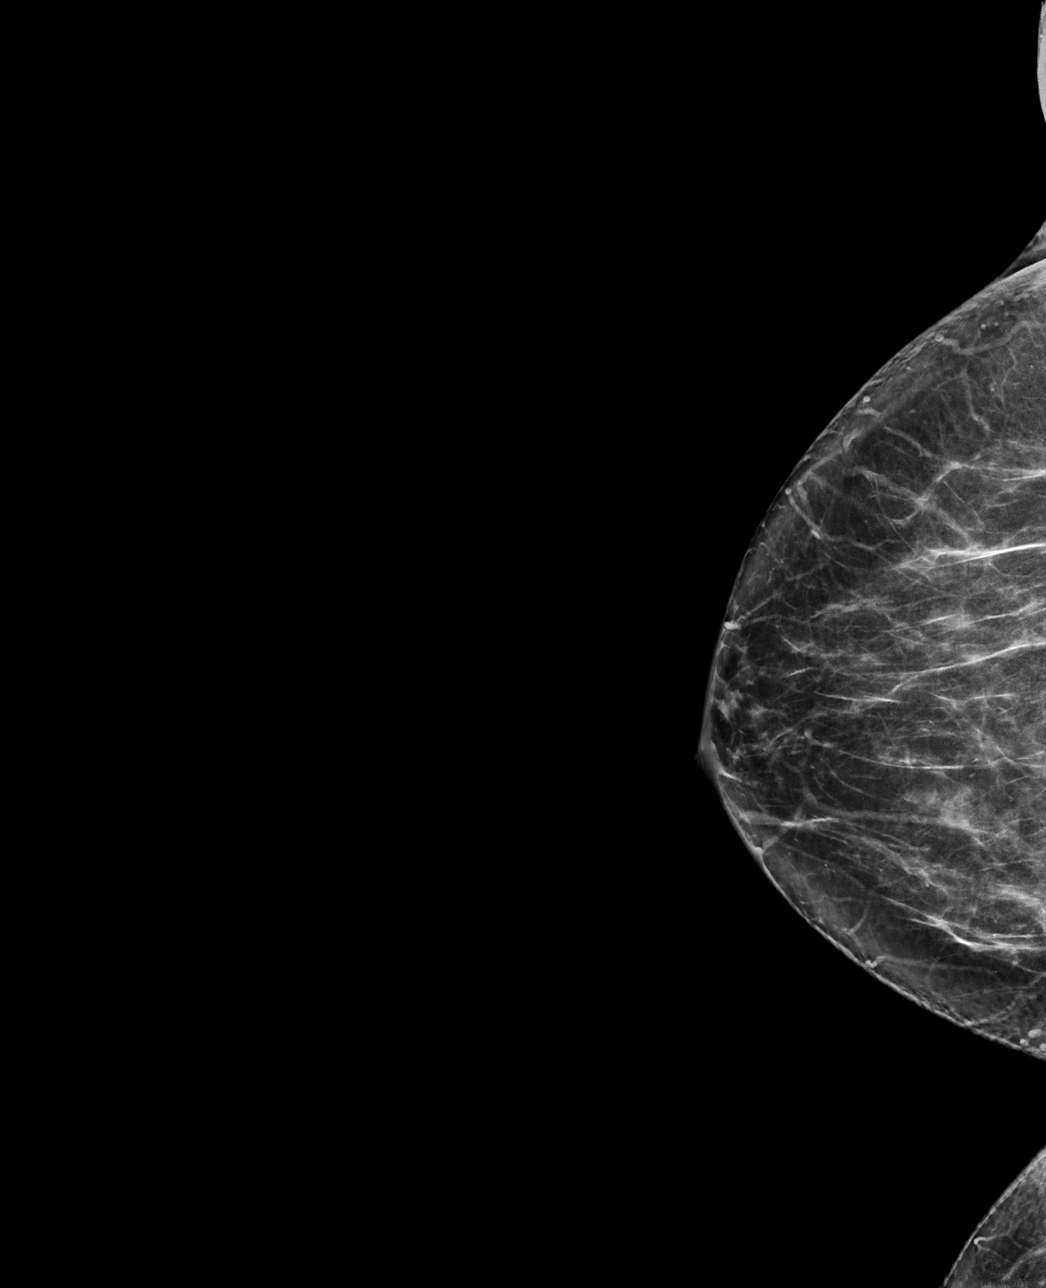

[R CC tomo · tomo slice 35/70.0]
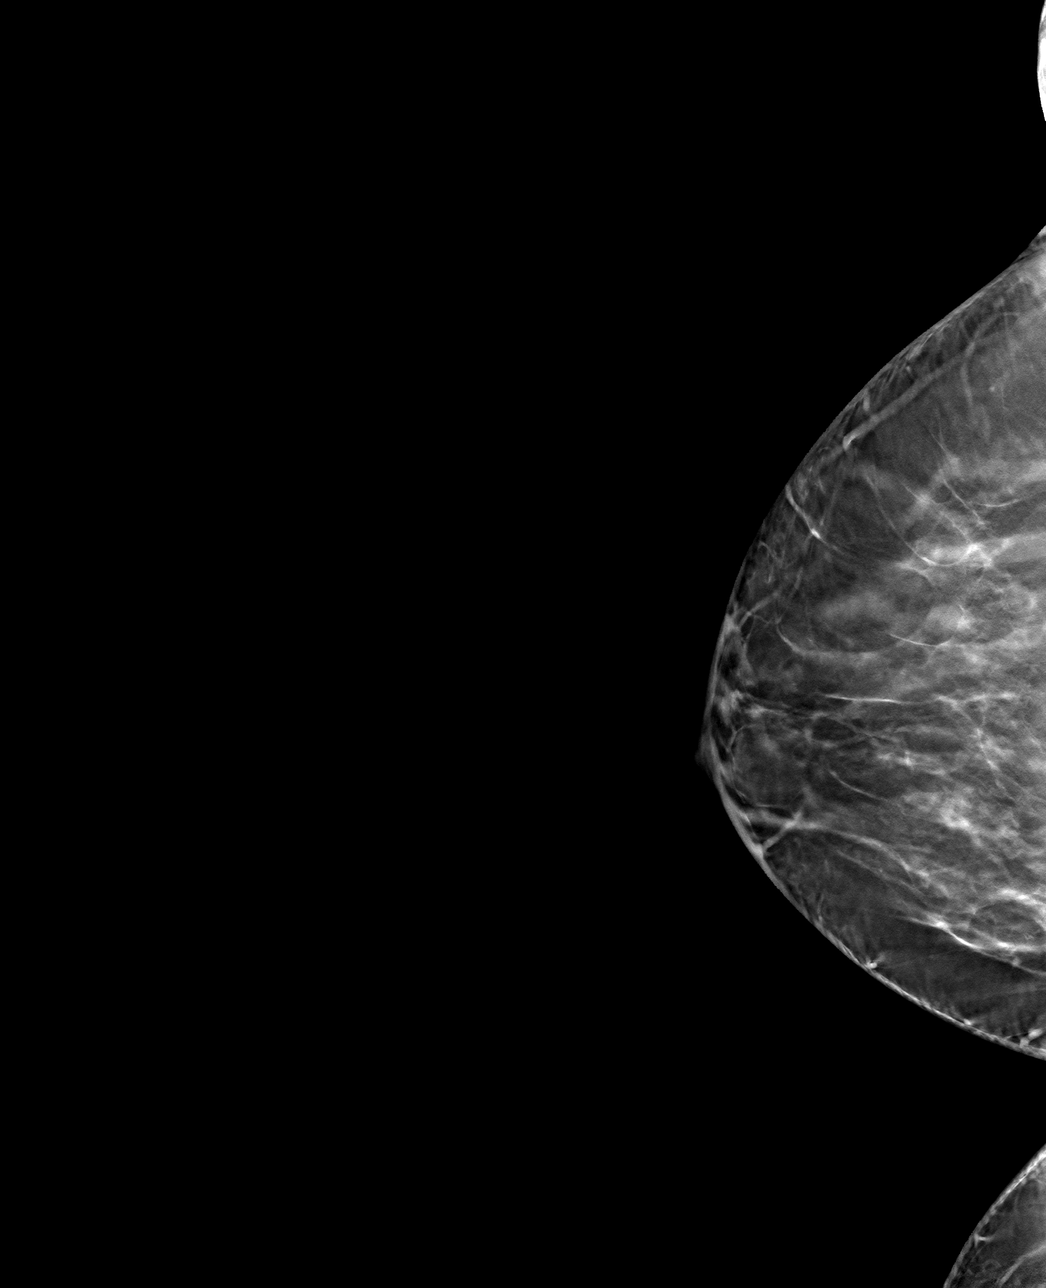

[R MLO tomo · tomo slice 35/70.0]
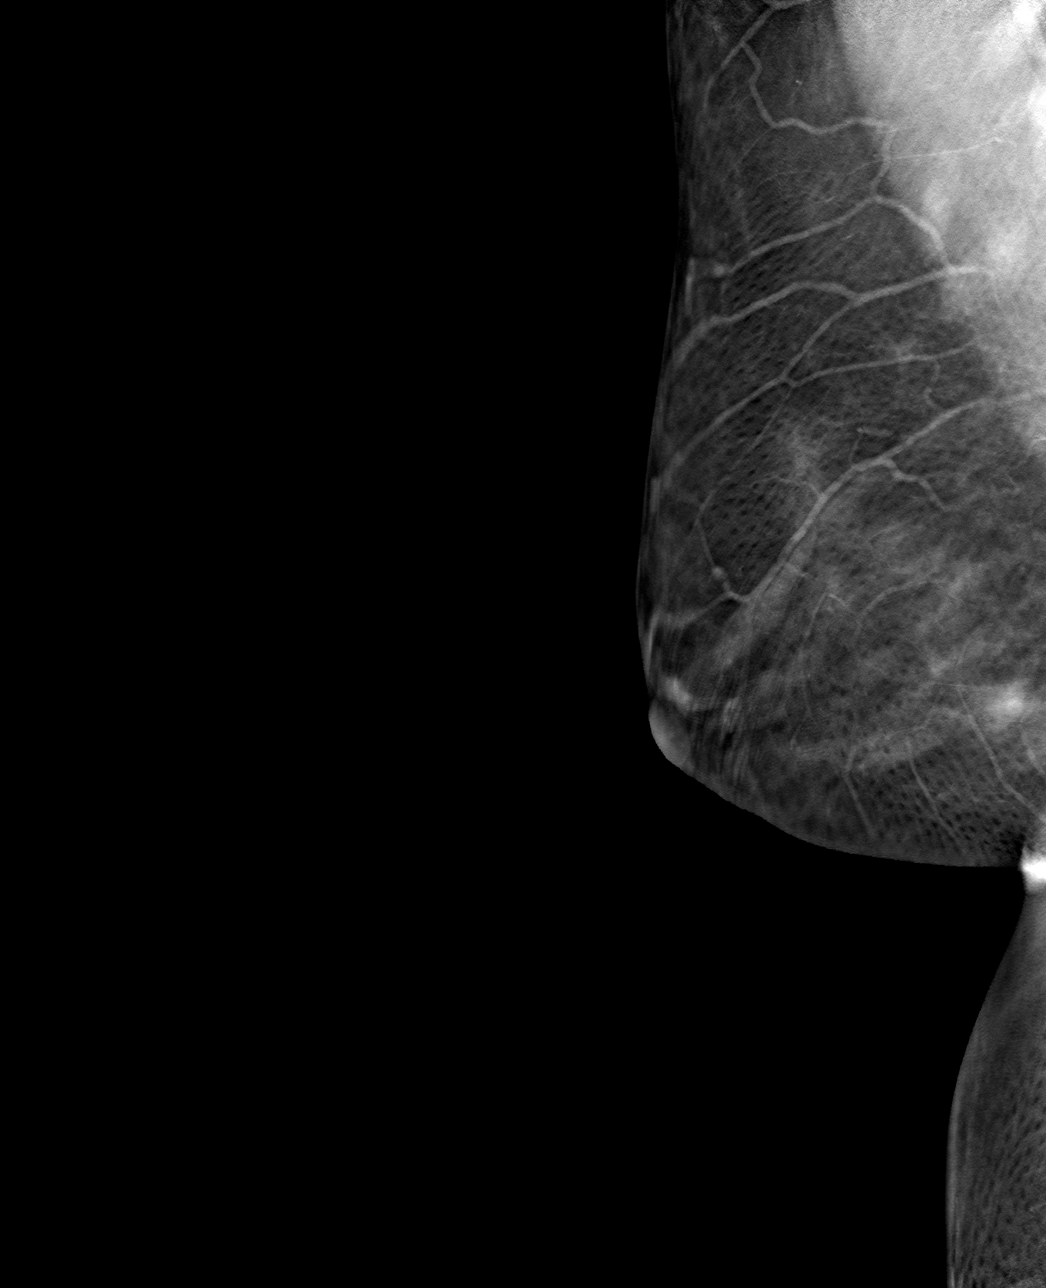

[4 of 12 positions shown; findings below may reference images not displayed]

ACR Breast Density Category b: There are scattered areas of
fibroglandular density.
FINDINGS: Tomosynthesis views were obtained over the site of nonfocal pain in
the RIGHT breast. No mammographic etiology for nonfocal breast pain
identified. No suspicious mass, distortion, or microcalcifications
are identified to suggest presence of malignancy.

Mammographic images were processed with CAD.
IMPRESSION: No mammographic evidence of malignancy. No mammographic etiology for
nonfocal breast pain identified. Any further workup of the patient's
symptoms should be based on the clinical assessment.

RECOMMENDATION:
Recommend annual screening mammography, due April 2021.

I have discussed the findings and recommendations with the patient.
If applicable, a reminder letter will be sent to the patient
regarding the next appointment.

BI-RADS CATEGORY  1: Negative.

## 2022-07-15 NOTE — Telephone Encounter (Signed)
Letter created. Please print and fax to Dr Lamount Cranker and make sure the patient gets a copy and understands the instructions for stopping her eliquis and taking the lovenox.

## 2022-07-16 ENCOUNTER — Ambulatory Visit: Payer: Self-pay

## 2022-07-16 ENCOUNTER — Telehealth: Payer: Self-pay

## 2022-07-16 NOTE — Patient Outreach (Signed)
  Care Coordination   07/16/2022 Name: Valerie Vazquez MRN: [id number] DOB: July 30, 1956   Care Coordination Outreach Attempts:  An unsuccessful telephone outreach was attempted for a scheduled appointment today.  Follow Up Plan:  Additional outreach attempts will be made to offer the patient care coordination information and services.   Encounter Outcome:  No Answer  Care Coordination Interventions Activated:  No   Care Coordination Interventions:  No, not indicated    Quinn Plowman RN,BSN,CCM [facility] [phone] direct line

## 2022-07-16 NOTE — Telephone Encounter (Signed)
Letter was faxed to Dr. Lamount Cranker and I called the patient and informed her that I sent a copy of the letter to her Mychart and she understood.  Valerie Vazquez,cma

## 2022-07-18 ENCOUNTER — Telehealth: Payer: Self-pay

## 2022-07-18 DIAGNOSIS — Z139 Encounter for screening, unspecified: Secondary | ICD-10-CM

## 2022-07-18 NOTE — Patient Outreach (Signed)
  Care Coordination   Follow Up Visit Note   07/18/2022 Name: Valerie MCELMURRY MRN: [id number] DOB: Feb 08, 1956  Valerie Vazquez is a 66 y.o. year old female who sees Caryl Bis, Angela Adam, MD for primary care. I spoke with  Valerie Vazquez by phone today.  What matters to the patients health and wellness today?  Patient states her blood sugars range from 116-177.  She states this is week 3 with taking her Ozempic. She reports noticing an annoying headache on occasion since taking it.  Patient reports having ongoing back pain.  She states she's requested a 2nd opinion referral to new neurosurgeon from her primary provider.   Patient states she will need transportation assistance to get to the neurosurgeon appointment.     Goals Addressed             This Visit's Progress    Patient stated:   diabetic education and assistance with management       Care Coordination Interventions: Evaluation of current treatment plan related to diabetes and patient's adherence to plan as established by provider Reviewed medications with patient and discussed importance of compliance Reviewed scheduled/upcoming provider appointments  Discussed importance of managing blood sugars post surgery. Advised to notify provider for frequent high >200 or low <70 blood sugars.  Discussed importance of a balanced diabetic diet, portion control, low carbohydrate diet Patient referred to community resource team for transportation assistance Advised patient to notify her provider if she continues having ongoing headaches with Ozempic           SDOH assessments and interventions completed:  No     Care Coordination Interventions Activated:  Yes  Care Coordination Interventions:  Yes, provided   Follow up plan: Follow up call scheduled for 08/20/22    Encounter Outcome:  Pt. Visit Completed   Quinn Plowman RN,BSN,CCM [facility] [phone] direct line

## 2022-07-23 ENCOUNTER — Telehealth: Payer: Self-pay | Admitting: *Deleted

## 2022-07-23 DIAGNOSIS — M48061 Spinal stenosis, lumbar region without neurogenic claudication: Secondary | ICD-10-CM | POA: Diagnosis not present

## 2022-07-23 NOTE — Telephone Encounter (Signed)
   Telephone encounter was:  Unsuccessful.  07/23/2022 Name: Valerie Vazquez MRN: [id number] DOB: 1956/03/02  Unsuccessful outbound call made today to assist with:  Transportation Needs   Outreach Attempt:  1st Attempt  A HIPAA compliant voice message was left requesting a return call.  Instructed patient to call back at [phone].  [facility] [phone] [address] , [place] [address] Email : Ashby [email or web address]. [email or web address]-moran [email or web address]

## 2022-07-23 NOTE — Telephone Encounter (Signed)
Error. ng 

## 2022-07-28 ENCOUNTER — Telehealth: Payer: Self-pay | Admitting: *Deleted

## 2022-07-28 IMAGING — CT CT ABD-PEL WO/W CM
3 of 12 series · 11 of 46 positions shown, 17 images · IV contrast (omnipaque)
Comparison: CT abdomen pelvis February 18, 2019 and CT abdomen pelvis
[DATE] [DATE], [DATE] [DATE]

CLINICAL DATA: Gross hematuria 1 week prior. No pain or discomfort
during urination.

EXAM:
CT ABDOMEN AND PELVIS WITHOUT AND WITH CONTRAST
TECHNIQUE: Multidetector CT imaging of the abdomen and pelvis was performed
following the standard protocol before and following the bolus
administration of intravenous contrast.
CONTRAST:  125mL OMNIPAQUE IOHEXOL 300 MG/ML  SOLN

[Series 9: axial with hematuria with 5.00 · axial · 0.83mm/px · z∈[-1477,-1127]mm · 7 of 94 slices shown, 12 images]
[im 12/94  soft-tissue]
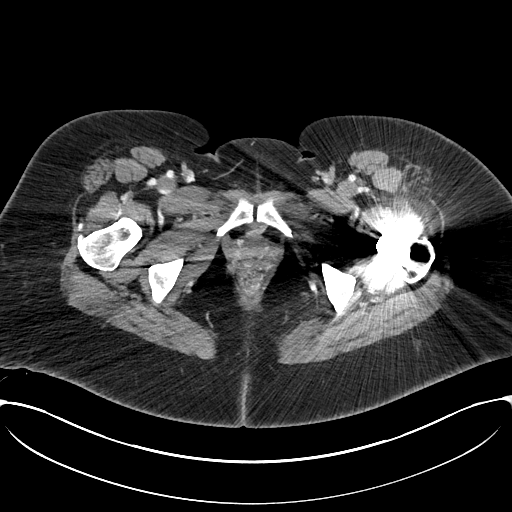
[im 12/94  bone]
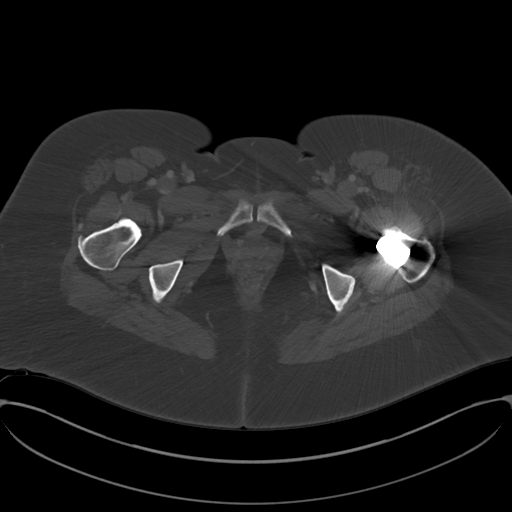
[im 24/94  soft-tissue]
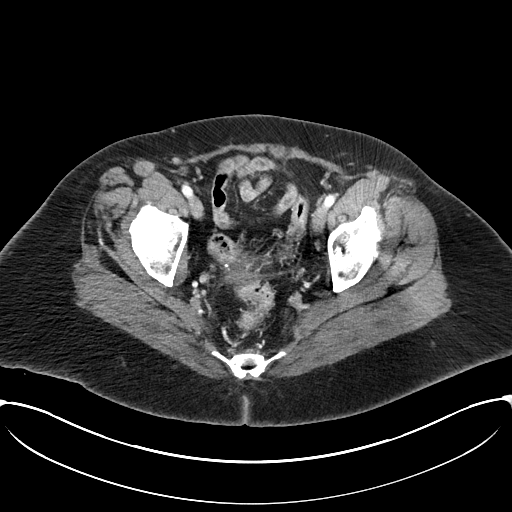
[im 35/94  soft-tissue]
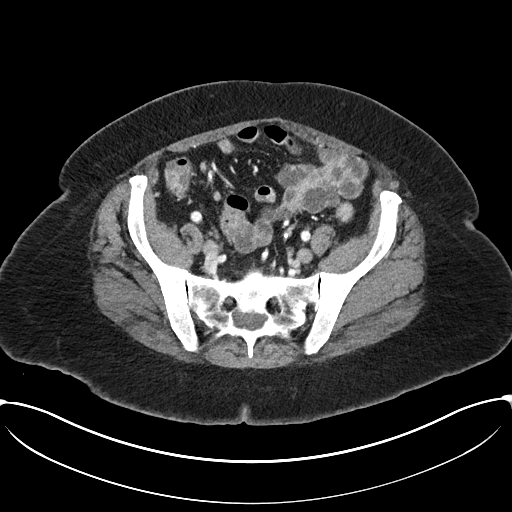
[im 47/94  soft-tissue]
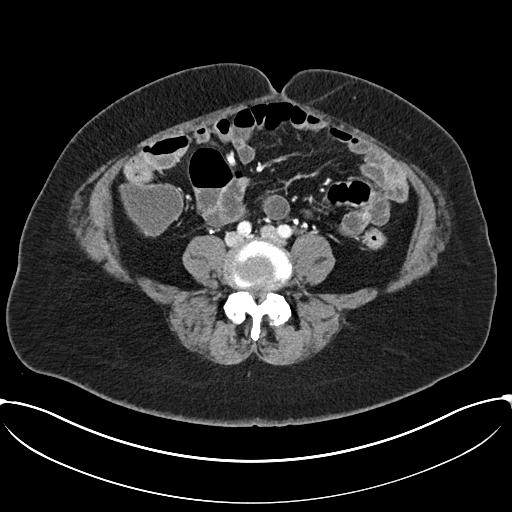
[im 47/94  lung]
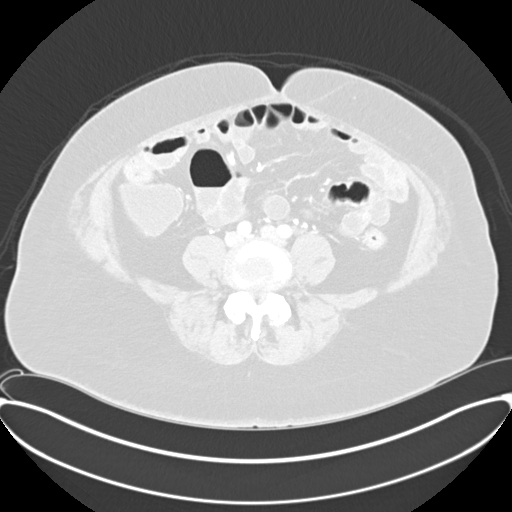
[im 59/94  soft-tissue]
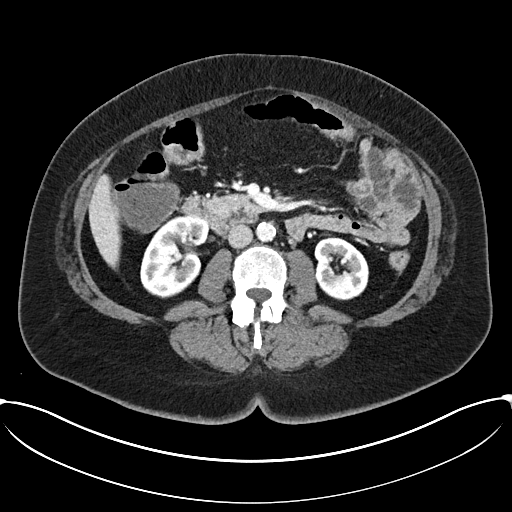
[im 59/94  lung]
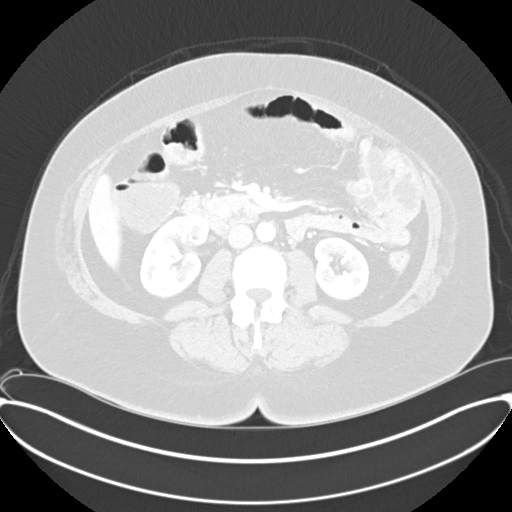
[im 70/94  soft-tissue]
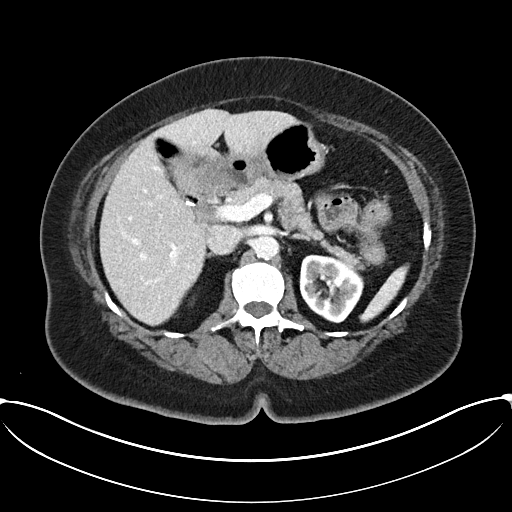
[im 70/94  lung]
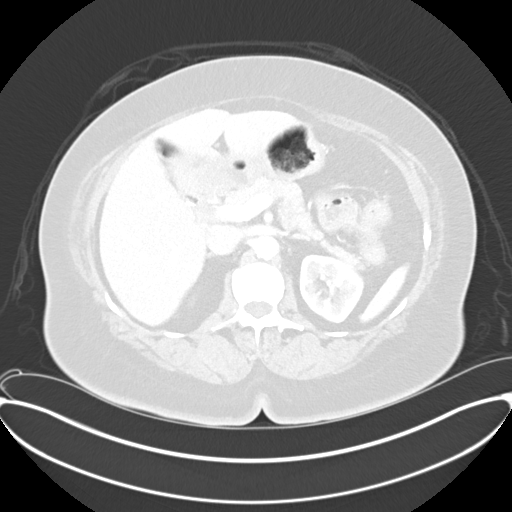
[im 82/94  soft-tissue]
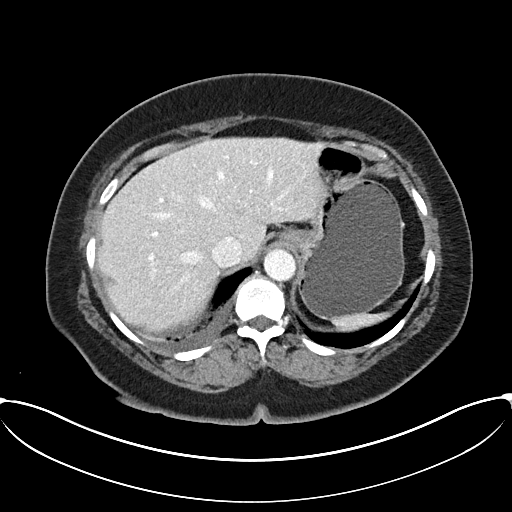
[im 82/94  lung]
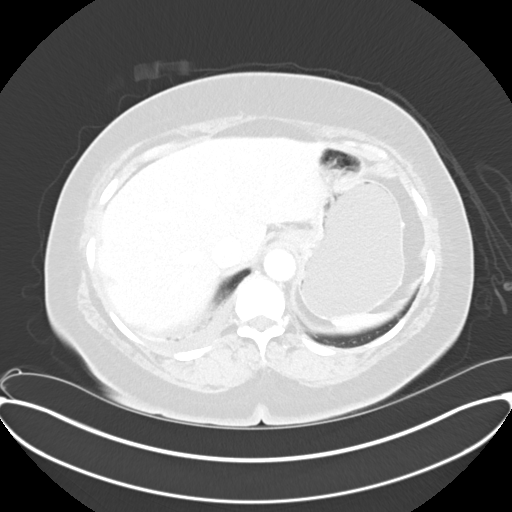

[Series 12: cor with hematuria with 2.00 cor · coronal · 0.83mm/px · 2 of 212 slices shown, 3 images]
[im 71/212  soft-tissue]
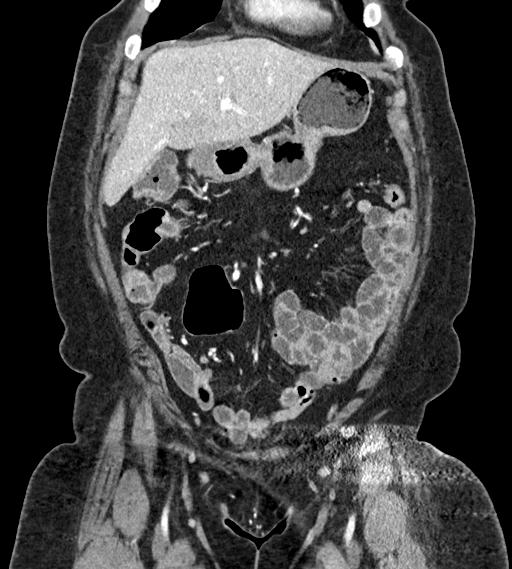
[im 71/212  bone]
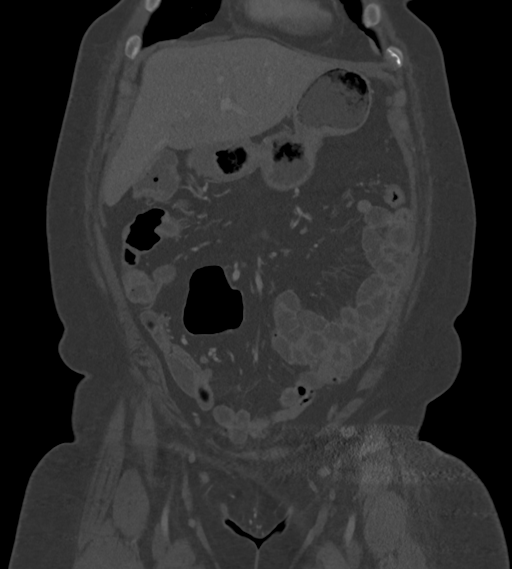
[im 141/212  soft-tissue]
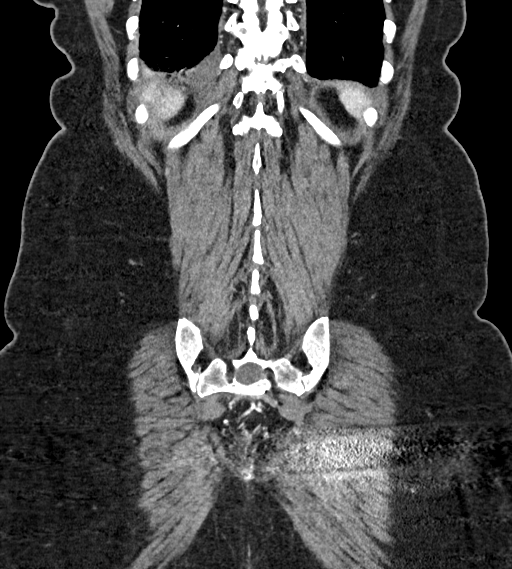

[Series 17: axial delay delay prone 5.00 · axial · delayed · 0.87mm/px · z∈[-1485,-1425]mm · 2 of 95 slices shown]
[im 12/95  soft-tissue]
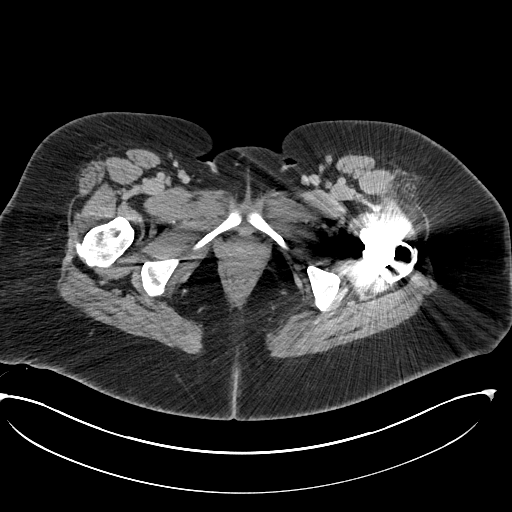
[im 24/95  soft-tissue]
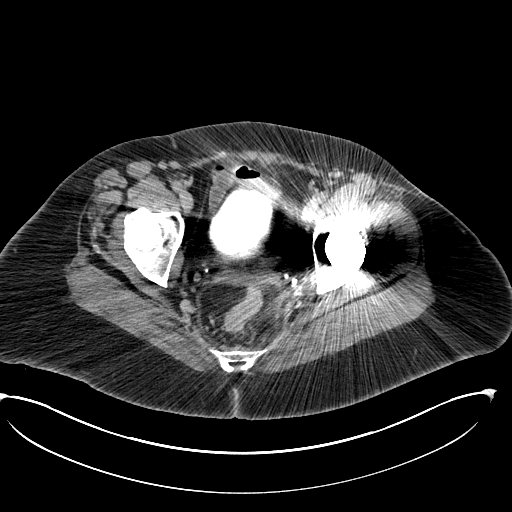

[11 of 46 positions shown; findings below may reference images not displayed]

FINDINGS: Lower chest: Tiny right pleural effusion versus pleural thickening,
similar to prior. No acute abnormality.

Hepatobiliary: No focal liver abnormality is seen. Status post
cholecystectomy. No biliary dilatation.

Pancreas: Unremarkable. No pancreatic ductal dilatation or
surrounding inflammatory changes.

Spleen: Normal in size without focal abnormality.

Adrenals/Urinary Tract: Adrenal glands are unremarkable. Kidneys are
normal, without renal calculi, focal lesion, or hydronephrosis.
Bladder evaluation is limited by streak artifact from left hip
arthroplasty.

Stomach/Bowel: Similar thickening of the gastric antrum. Resolution
of the proximal duodenal wall thickening. Appendix surgically absent
by history. There is mild wall thickening with adjacent inflammation
involving the sigmoid colon. No inflamed diverticula. However
evaluation is somewhat limited by streak artifact from left hip
arthroplasty.

Vascular/Lymphatic: Aortic atherosclerosis. Interval removal of the
IVC filter. No enlarged abdominal or pelvic lymph nodes.

Reproductive: Status post hysterectomy. Small cysts in the bilateral
ovaries are again seen measuring 2.2 cm on the left and 1.7 cm on
the right, and are stable dating back to at least May 28, 2014.

Other: 6 trace pelvic free fluid.  No pneumoperitoneum.

Musculoskeletal: Mild degenerative changes spine. Left hip
prosthesis. No acute osseous abnormality.
IMPRESSION: 1. No evidence of renal calculi or hydronephrosis. Evaluation of
bladder is limited by streak artifact from left hip prosthesis
2. Mild wall thickening with adjacent inflammation involving the
sigmoid colon, which may represent a mild infectious or inflammatory
colitis.
3. Similar thickening of the gastric antrum and resolution of the
proximal duodenal wall thickening.
4. Tiny right pleural effusion versus pleural thickening, similar to
prior.
5. Stable size of the bilateral ovarian cysts which measure 2.2 cm
or smaller. No follow-up imaging recommended. Note: This
recommendation does not apply to premenarchal patients and to those
with increased risk (genetic, family history, elevated tumor markers
or other high-risk factors) of ovarian cancer. Reference: JACR [DATE]):[id number]
6. Aortic atherosclerosis.

Aortic Atherosclerosis (OG2GM-GH1.1).

## 2022-07-28 NOTE — Telephone Encounter (Signed)
   Telephone encounter was:  Successful.  07/28/2022 Name: PATRECE TALLIE MRN: [id number] DOB: [birth year]  Leonie Man Bodkins is a 66 y.o. year old female who is a primary care patient of Caryl Bis, Angela Adam, MD . The community resource team was consulted for assistance with Transportation Needs   Care guide performed the following interventions: Patient provided with information about care guide support team and interviewed to confirm resource needs.  Follow Up Plan: Patient does not have appt scheduled provided her my contact information to reach out as soon as she can as well as any s-addional appts she has  [facility] [address] , [place] [address] Email : Ashby Dawes. Greenauer-moran [email or web address]

## 2022-07-31 DIAGNOSIS — E039 Hypothyroidism, unspecified: Secondary | ICD-10-CM | POA: Diagnosis not present

## 2022-07-31 DIAGNOSIS — E119 Type 2 diabetes mellitus without complications: Secondary | ICD-10-CM | POA: Diagnosis not present

## 2022-08-06 NOTE — Telephone Encounter (Signed)
Pt called regarding a spine injection that [facility] is suppose to give her. Pt states they have not receive any information from the office. Pt would like to be called

## 2022-08-07 ENCOUNTER — Encounter: Payer: Self-pay | Admitting: Family Medicine

## 2022-08-07 DIAGNOSIS — M5136 Other intervertebral disc degeneration, lumbar region: Secondary | ICD-10-CM

## 2022-08-11 DIAGNOSIS — E039 Hypothyroidism, unspecified: Secondary | ICD-10-CM | POA: Diagnosis not present

## 2022-08-11 DIAGNOSIS — E119 Type 2 diabetes mellitus without complications: Secondary | ICD-10-CM | POA: Diagnosis not present

## 2022-08-15 ENCOUNTER — Telehealth: Payer: Self-pay | Admitting: *Deleted

## 2022-08-15 ENCOUNTER — Other Ambulatory Visit: Payer: Self-pay | Admitting: Family Medicine

## 2022-08-15 DIAGNOSIS — E785 Hyperlipidemia, unspecified: Secondary | ICD-10-CM

## 2022-08-15 NOTE — Telephone Encounter (Signed)
   Valerie Vazquez DOB: [birth year] MRN: [id number]   RIDER WAIVER AND RELEASE OF LIABILITY  For purposes of improving physical access to our facilities, [facility] is pleased to partner with third parties to provide [facility] patients or other authorized individuals the option of convenient, on-demand ground transportation services (the [place]") through use of the technology service that enables users to request on-demand ground transportation from independent third-party providers.  By opting to use and accept these [employer], I, the undersigned, hereby agree on behalf of myself, and on behalf of any minor child using the Government social research officer for whom I am the parent or legal guardian, as follows:  Government social research officer provided to me are provided by independent third-party transportation providers who are not [employer] or employees and who are unaffiliated with [employer]. [facility] is neither a transportation carrier nor a common or public carrier. [facility] has no control over the quality or safety of the transportation that occurs as a result of the [employer]. [facility] cannot guarantee that any third-party transportation provider will complete any arranged transportation service. [facility] makes no representation, warranty, or guarantee regarding the reliability, timeliness, quality, safety, suitability, or availability of any of the Transport Services or that they will be error free. I fully understand that traveling by vehicle involves risks and dangers of serious bodily injury, including permanent disability, paralysis, and death. I agree, on behalf of myself and on behalf of any minor child using the Transport Services for whom I am the parent or legal guardian, that the entire risk arising out of my use of the [employer] remains solely with me, to the maximum extent permitted under applicable law. The [employer] are provided "as  is" and "as available." [facility] disclaims all representations and warranties, express, implied or statutory, not expressly set out in these terms, including the implied warranties of merchantability and fitness for a particular purpose. I hereby waive and release [facility], its agents, employees, officers, directors, representatives, insurers, attorneys, assigns, successors, subsidiaries, and affiliates from any and all past, present, or future claims, demands, liabilities, actions, causes of action, or suits of any kind directly or indirectly arising from acceptance and use of the [employer]. I further waive and release [facility] and its affiliates from all present and future liability and responsibility for any injury or death to persons or damages to property caused by or related to the use of the [employer]. I have read this Waiver and Release of Liability, and I understand the terms used in it and their legal significance. This Waiver is freely and voluntarily given with the understanding that my right (as well as the right of any minor child for whom I am the parent or legal guardian using the [employer]) to legal recourse against  in connection with the [employer] is knowingly surrendered in return for use of these services.   I attest that I read the consent document to Valerie Vazquez, gave Valerie Vazquez the opportunity to ask questions and answered the questions asked (if any). I affirm that Valerie Vazquez then provided consent for she's participation in this program.     Valerie Vazquez

## 2022-08-15 NOTE — Telephone Encounter (Deleted)
Transportation Exception Form  * If the practice/clinic is more than 25 miles from the patient's address, please provide the following information:    Patients Name Valerie Vazquez Up Date   Patient DOB 03-29-1956 Practice Location   Patient MRN [id number]    Patient Home Address [place] [address]      Description of Clinical Conditions Necessitating Transport          Electronic Signature of Requester: SYLVIE MIFSUD  DATE:  08/15/2022     Valerie Vazquez DOB: Jul 20, 1956 MRN: [id number]   RIDER WAIVER AND RELEASE OF LIABILITY  For purposes of improving physical access to our facilities, [facility] is pleased to partner with third parties to provide [facility] patients or other authorized individuals the option of convenient, on-demand ground transportation services (the [employer]") through use of the technology service that enables users to request on-demand ground transportation from independent third-party providers.  By opting to use and accept these [employer], I, the undersigned, hereby agree on behalf of myself, and on behalf of any minor child using the Government social research officer for whom I am the parent or legal guardian, as follows:  Government social research officer provided to me are provided by independent third-party transportation providers who are not [employer] or employees and who are unaffiliated with [employer]. [facility] is neither a transportation carrier nor a common or public carrier. [facility] has no control over the quality or safety of the transportation that occurs as a result of the [employer]. [facility] cannot guarantee that any third-party transportation provider will complete any arranged transportation service. [facility] makes no representation, warranty, or guarantee regarding the reliability, timeliness, quality, safety, suitability, or availability of any of the Transport Services or that they  will be error free. I fully understand that traveling by vehicle involves risks and dangers of serious bodily injury, including permanent disability, paralysis, and death. I agree, on behalf of myself and on behalf of any minor child using the Transport Services for whom I am the parent or legal guardian, that the entire risk arising out of my use of the [employer] remains solely with me, to the maximum extent permitted under applicable law. The [employer] are provided "as is" and "as available." [facility] disclaims all representations and warranties, express, implied or statutory, not expressly set out in these terms, including the implied warranties of merchantability and fitness for a particular purpose. I hereby waive and release [facility], its agents, employees, officers, directors, representatives, insurers, attorneys, assigns, successors, subsidiaries, and affiliates from any and all past, present, or future claims, demands, liabilities, actions, causes of action, or suits of any kind directly or indirectly arising from acceptance and use of the [employer]. I further waive and release [facility] and its affiliates from all present and future liability and responsibility for any injury or death to persons or damages to property caused by or related to the use of the [employer]. I have read this Waiver and Release of Liability, and I understand the terms used in it and their legal significance. This Waiver is freely and voluntarily given with the understanding that my right (as well as the right of any minor child for whom I am the parent or legal guardian using the [employer]) to legal recourse against  in connection with the [employer] is knowingly surrendered in return for use of these services.  I attest that I read the consent document to [name], gave Ms. Menge the opportunity to ask questions and answered the questions  asked (if any). I affirm that Rindy Kollman Meske then provided consent for she's participation in this program.     [name]

## 2022-08-15 NOTE — Telephone Encounter (Signed)
   Telephone encounter was:  Successful.  08/15/2022 Name: Valerie Vazquez MRN: [id number] DOB: 25-Nov-1955  Valerie Vazquez is a 66 y.o. year old female who is a primary care patient of Caryl Bis, Angela Adam, MD . The community resource team was consulted for assistance with Transportation Needs  Completied application for community transportation but has dental appt and her insurance benefits have been exhausted and the community wont go across the [employer] guide performed the following interventions: Patient provided with information about care guide support team and interviewed to confirm resource needs.  Follow Up Plan:  Will follow up when ride confirmed   [facility] [address] , [place] [address] Email : Ashby Dawes. Greenauer-moran '@[email or web address]

## 2022-08-20 ENCOUNTER — Ambulatory Visit: Payer: Self-pay

## 2022-08-20 ENCOUNTER — Telehealth: Payer: Self-pay

## 2022-08-20 NOTE — Telephone Encounter (Signed)
Pt called wanting an update on her spine injection appointment

## 2022-08-20 NOTE — Patient Outreach (Signed)
  Care Coordination   08/20/2022 Name: Valerie Vazquez MRN: [id number] DOB: 03-22-1956   Care Coordination Outreach Attempts:  Successful outreach call to patient. Patient reports she is sleeping.  Agreed to return call later today.   Follow Up Plan:  Additional outreach attempts will be made to offer the patient care coordination information and services.   Encounter Outcome:  Pt. Request to Call Back   Care Coordination Interventions:  No, not indicated    Quinn Plowman [facility] [phone] direct line

## 2022-08-20 NOTE — Patient Outreach (Signed)
  Care Coordination   Follow Up Visit Note   08/20/2022 Name: PAMILA MENDIBLES MRN: [id number] DOB: 11-24-1955  Leonie Man [place] is a 66 y.o. year old female who sees Caryl Bis, Angela Adam, MD for primary care. I spoke with  Leonie Man [place] by phone today.  What matters to the patients health and wellness today?  Ongoing diabetic education and management.     Goals Addressed             This Visit's Progress    Patient stated:   diabetic education and assistance with management       Care Coordination Interventions: Evaluation of current treatment plan related to diabetes and patient's adherence to plan as established by provider:  Patient states she is doing ok.  She reports today's fasting blood sugar was 135 and she  reports blood sugar range is 100's-160.  Patient states she is cutting back on her sweets.  Reviewed medications with patient and discussed importance of compliance:  Patient denies any change in medications Patient given contact name and  phone number for [facility]. Advised patient to call and follow up on referral and schedule appointment.  Patient stated she was not sure how to access the referral to the neurosurgeon.  Reviewed scheduled/upcoming provider appointments:  Per chart review patients next scheduled follow up visit with primary care provider is 09/09/22 Discussed importance of managing blood sugars post surgery. Advised to notify provider for frequent high >200 or low <70 blood sugars.  Discussed importance of a balanced diabetic diet, portion control, low carbohydrate diet Confirmed patient received contact from community resource guide regarding transportation assistance.  Patient confirmed.             SDOH assessments and interventions completed:  No     Care Coordination Interventions:  Yes, provided   Follow up plan: Follow up call scheduled for 09/18/22    Encounter Outcome:  Pt. Visit Completed   Quinn Plowman  RN,BSN,CCM [facility] [phone] direct line

## 2022-08-21 NOTE — Telephone Encounter (Signed)
Patient called and note was read. She will call [facility] again and have them refax if there is any new updates.

## 2022-08-31 DIAGNOSIS — E119 Type 2 diabetes mellitus without complications: Secondary | ICD-10-CM | POA: Diagnosis not present

## 2022-08-31 DIAGNOSIS — E039 Hypothyroidism, unspecified: Secondary | ICD-10-CM | POA: Diagnosis not present

## 2022-09-08 ENCOUNTER — Telehealth: Payer: Self-pay | Admitting: *Deleted

## 2022-09-09 ENCOUNTER — Ambulatory Visit: Payer: Medicare Other | Admitting: Family Medicine

## 2022-09-09 ENCOUNTER — Encounter: Payer: Self-pay | Admitting: Family Medicine

## 2022-09-09 ENCOUNTER — Ambulatory Visit (INDEPENDENT_AMBULATORY_CARE_PROVIDER_SITE_OTHER): Payer: Medicare Other | Admitting: Family Medicine

## 2022-09-09 VITALS — BP 110/70 | HR 82 | Temp 98.6°F | Ht 66.0 in | Wt 221.0 lb

## 2022-09-09 DIAGNOSIS — M545 Low back pain, unspecified: Secondary | ICD-10-CM | POA: Diagnosis not present

## 2022-09-09 DIAGNOSIS — Z86711 Personal history of pulmonary embolism: Secondary | ICD-10-CM

## 2022-09-09 DIAGNOSIS — E119 Type 2 diabetes mellitus without complications: Secondary | ICD-10-CM

## 2022-09-09 LAB — POCT GLYCOSYLATED HEMOGLOBIN (HGB A1C): Hemoglobin A1C: 7 % — AB (ref 4.0–5.6)

## 2022-09-09 LAB — MICROALBUMIN / CREATININE URINE RATIO
Creatinine,U: 63.6 mg/dL
Microalb Creat Ratio: 1.1 mg/g (ref 0.0–30.0)
Microalb, Ur: <0.7 mg/dL (ref 0.0–1.9)

## 2022-09-09 MED ORDER — SEMAGLUTIDE (1 MG/DOSE) 4 MG/3ML ~~LOC~~ SOPN: 1.0000 mg | PEN_INJECTOR | SUBCUTANEOUS | 2 refills | Status: DC

## 2022-09-09 NOTE — Assessment & Plan Note (Addendum)
Chronic issue.  Asymptomatic.  She will continue Eliquis 5 mg twice daily.

## 2022-09-09 NOTE — Assessment & Plan Note (Signed)
Chronic issue.  Well-controlled.  We are going to increase her Ozempic to 1 mg weekly for weight loss benefits.  She will continue Jardiance 25 mg daily.  Check urine microalbumin creatinine ratio as well.  Check A1c.

## 2022-09-09 NOTE — Patient Instructions (Signed)
Nice to see you. We are going to increase your Ozempic to 1 mg weekly to help with diabetes and weight loss.  If you have any excessive nausea or vomiting or other side effects with this please let us know right away.

## 2022-09-09 NOTE — Progress Notes (Signed)
[name], MD Phone: [phone]  Valerie Vazquez is a 67 y.o. female who presents today for follow-up.  Chronic back pain: Patient notes this is generally stable.  No radiation or numbness.  Occasionally her legs feel weak.  She sees neurosurgery for a second opinion later this week.  She ended up canceling her spinal injection as the office she was going to wanted her to come in for another follow-up.  History of PE: Patient is currently on Eliquis.  She has not missed any doses.  She rarely has bilateral leg swelling that resolves with propping her legs up overnight.  No orthopnea or PND.  No bleeding issues.  Diabetes: Sugars are typically less than 130.  She is taking Jardiance and Ozempic.  No polyuria or polydipsia.  No hypoglycemia.  She notes she has not lost weight on the Ozempic.  Social History   Tobacco Use  Smoking Status Former   Packs/day: 1.50   Years: 10.00   Total pack years: 15.00   Types: Cigarettes   Quit date: 04/12/1979   Years since quitting: 43.4  Smokeless Tobacco Never    Current Outpatient Medications on File Prior to Visit  Medication Sig Dispense Refill   acetaminophen (TYLENOL) 500 MG tablet Take 1 tablet (500 mg total) by mouth every 6 (six) hours as needed. 30 tablet 0   b complex vitamins capsule Take 1 capsule by mouth daily.     baclofen (LIORESAL) 10 MG tablet TAKE ONE TABLET BY MOUTH THREE TIMES DAILY AS NEEDED FOR MUSCLE SPASMS (VIAL) 60 tablet 11   blood glucose meter kit and supplies KIT Dispense based on patient and insurance preference. Use once daily fasting. Dx code E11.9. 1 each 0   Blood Glucose Monitoring Suppl (GHT BLOOD GLUCOSE MONITOR) w/Device KIT DISPENSE BASED ON PATIENT AND INSURANCE PREFERENCE. USE ONCE DAILY AS DIRECTED. DX CODE E11.9. 1 kit 0   dicyclomine (BENTYL) 20 MG tablet TAKE ONE TABLET BY MOUTH THREE TIMES DAILY AS NEEDED FOR ABDOMINAL SPASMS (VIAL) 270 tablet 11   diphenhydrAMINE (BENADRYL) 25 MG tablet Take  1 tablet (25 mg total) by mouth every 6 (six) hours. 12 tablet 0   ELIQUIS 5 MG TABS tablet TAKE ONE TABLET BY MOUTH TWICE DAILY '@9AM'$  & 5PM 60 tablet 11   escitalopram (LEXAPRO) 20 MG tablet TAKE ONE TABLET BY MOUTH DAILY AT 9AM 90 tablet 2   famotidine (PEPCID) 20 MG tablet TAKE ONE TABLET BY MOUTH DAILY AT 9PM AT BEDTIME 90 tablet 11   furosemide (LASIX) 20 MG tablet TAKE ONE TABLET BY MOUTH DAILY AS NEEDED for edema. (VIAL) 90 tablet 11   gabapentin (NEURONTIN) 600 MG tablet TAKE ONE TABLET BY MOUTH DAILY AT 9AM and TAKE 1 AND 1/2 TABLETS DAILY AT 9PM AT BEDTIME 225 tablet 11   isosorbide mononitrate (IMDUR) 30 MG 24 hr tablet TAKE ONE TABLET BY MOUTH DAILY AT 9AM 90 tablet 2   JARDIANCE 25 MG TABS tablet TAKE ONE TABLET BY MOUTH DAILY AT 9AM 90 tablet 1   levothyroxine (SYNTHROID) 25 MCG tablet TAKE ONE TABLET BY MOUTH DAILY AT 7AM BEFORE BREAKFAST AND 2-3 HOURS BEFORE TAKING ANY OTHER MEDICTION OR SUPPLEMENT 90 tablet 3   nitroGLYCERIN (NITROSTAT) 0.4 MG SL tablet Place 1 tablet (0.4 mg total) under the tongue every 5 (five) minutes as needed for chest pain. 25 tablet 3   ondansetron (ZOFRAN-ODT) 4 MG disintegrating tablet Take 1 tablet (4 mg total) by mouth every 6 (six) hours as  needed for nausea. 20 tablet 0   OVER THE COUNTER MEDICATION 1,500 mg daily. Yellowdock Root     OVER THE COUNTER MEDICATION 200 mg daily. Magnesium Citrate Gummy     OVER THE COUNTER MEDICATION Power C 2 gummies daily     oxyCODONE-acetaminophen (PERCOCET/ROXICET) 5-325 MG tablet Take 1 tablet by mouth every 6 (six) hours as needed (breakthrough pain). 15 tablet 0   pantoprazole (PROTONIX) 40 MG tablet TAKE ONE TABLET BY MOUTH DAILY AT 9AM 72 tablet 11   potassium chloride SA (KLOR-CON M) 20 MEQ tablet TAKE ONE TABLET BY MOUTH DAILY AS NEEDED TAKE WITH LASIX (VIAL) 90 tablet 11   rosuvastatin (CRESTOR) 40 MG tablet TAKE 1 TABLET BY MOUTH EVERY DAY 90 tablet 1   Vitamin A 2400 MCG (8000 UT) TABS Take by mouth daily.      vitamin E 180 MG (400 UNITS) capsule Take 400 Units by mouth daily.     Zinc Sulfate (ZINC 15 PO) Take by mouth daily.     No current facility-administered medications on file prior to visit.     ROS see history of present illness  Objective  Physical Exam Vitals:   09/09/22 1113 09/09/22 1129  BP: 120/85 110/70  Pulse: 82   Temp: 98.6 F (37 C)   SpO2: 96%     BP Readings from Last 3 Encounters:  09/09/22 110/70  06/09/22 118/78  05/22/22 (!) 144/78   Wt Readings from Last 3 Encounters:  09/09/22 221 lb (100.2 kg)  06/09/22 224 lb (101.6 kg)  05/21/22 224 lb 13.9 oz (102 kg)    Physical Exam Constitutional:      General: She is not in acute distress.    Appearance: She is not diaphoretic.  Cardiovascular:     Rate and Rhythm: Normal rate and regular rhythm.     Heart sounds: Normal heart sounds.  Pulmonary:     Effort: Pulmonary effort is normal.     Breath sounds: Normal breath sounds.  Skin:    General: Skin is warm and dry.  Neurological:     Mental Status: She is alert.      Assessment/Plan: Please see individual problem list.  Diabetes mellitus without complication ([place]) Assessment & Plan: Chronic issue.  Well-controlled.  We are going to increase her Ozempic to 1 mg weekly for weight loss benefits.  She will continue Jardiance 25 mg daily.  Check urine microalbumin creatinine ratio as well.  Check A1c.  Orders: -     POCT glycosylated hemoglobin (Hb A1C) -     Microalbumin / creatinine urine ratio -     Semaglutide (1 MG/DOSE); Inject 1 mg as directed once a week.  Dispense: 9 mL; Refill: 2  History of pulmonary embolus (PE) Assessment & Plan: Chronic issue.  Asymptomatic.  She will continue Eliquis 5 mg twice daily.   Bilateral low back pain without sciatica, unspecified chronicity Assessment & Plan: Chronic issue.  Stable.  She will see her new neurosurgeon later this week to determine the neck step in management.      Return in  about 3 months (around 12/09/2022) for Diabetes/weight.   [name], MD [place]

## 2022-09-09 NOTE — Assessment & Plan Note (Signed)
Chronic issue.  Stable.  She will see her new neurosurgeon later this week to determine the neck step in management.

## 2022-09-10 DIAGNOSIS — E039 Hypothyroidism, unspecified: Secondary | ICD-10-CM | POA: Diagnosis not present

## 2022-09-10 DIAGNOSIS — E119 Type 2 diabetes mellitus without complications: Secondary | ICD-10-CM | POA: Diagnosis not present

## 2022-09-11 DIAGNOSIS — M4316 Spondylolisthesis, lumbar region: Secondary | ICD-10-CM | POA: Diagnosis not present

## 2022-09-14 ENCOUNTER — Encounter: Payer: Self-pay | Admitting: Family Medicine

## 2022-09-15 IMAGING — US US EXTREM  UP VENOUS*R*
1 series · 13 of 24 positions shown · non-contrast
Comparison: Right upper extremity venous Doppler
ultrasound-06/09/2014 (negative).

CLINICAL DATA: Right upper extremity pain and edema. History of
diabetes and DVT/pulmonary embolism. Former smoker.



[Series 1: us venous img upper uni right (dvt) · portal-venous · 13 of 31 slices shown]
[im 1/31]
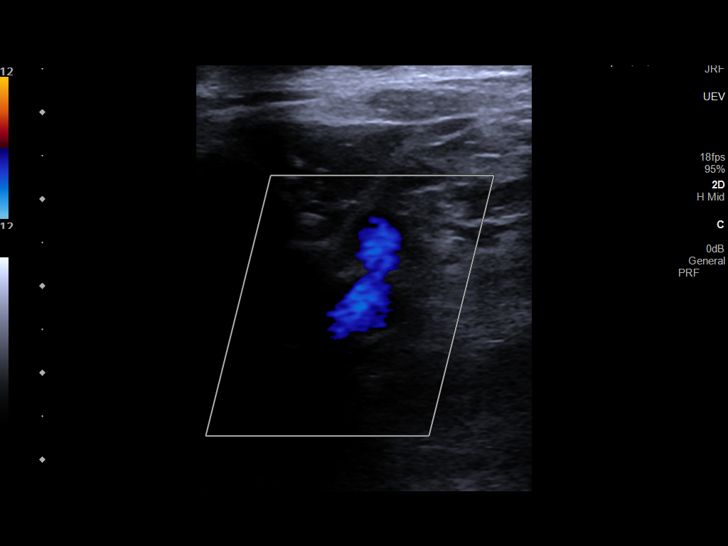
[im 3/31]
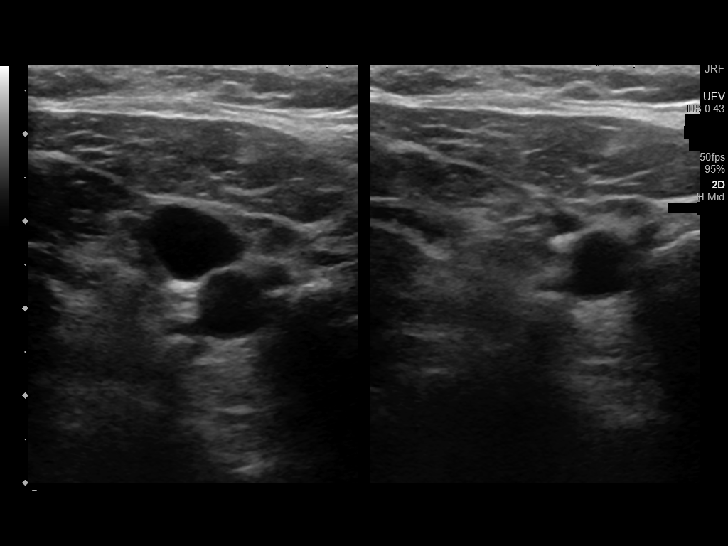
[im 6/31]
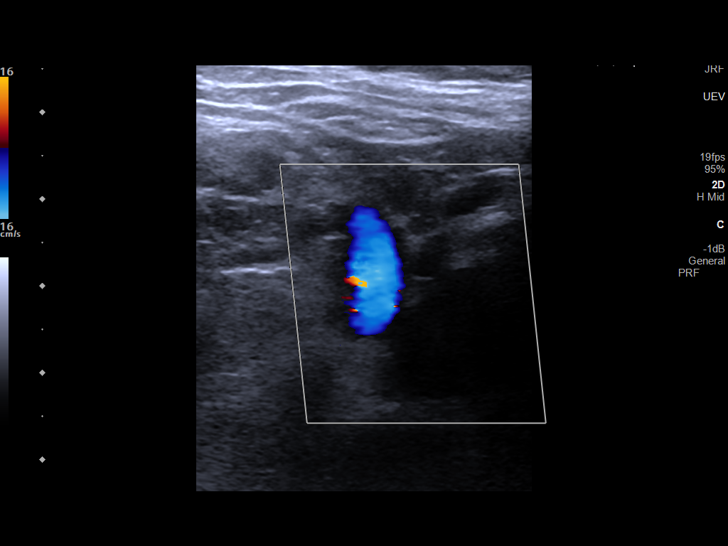
[im 8/31]
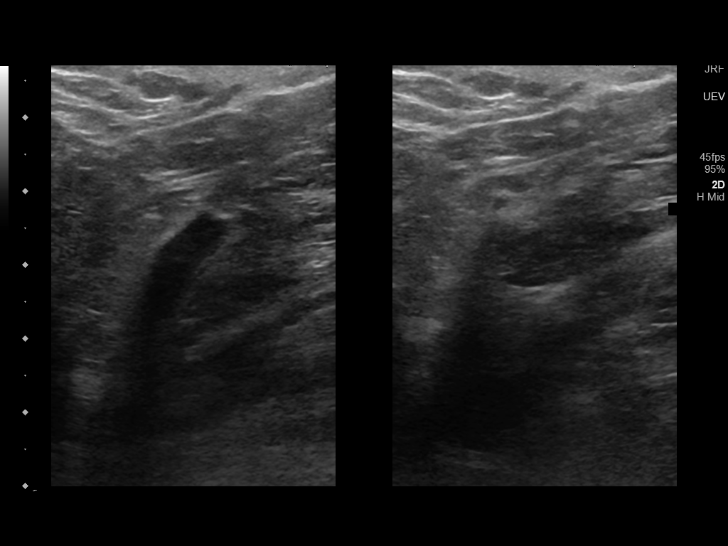
[im 11/31]
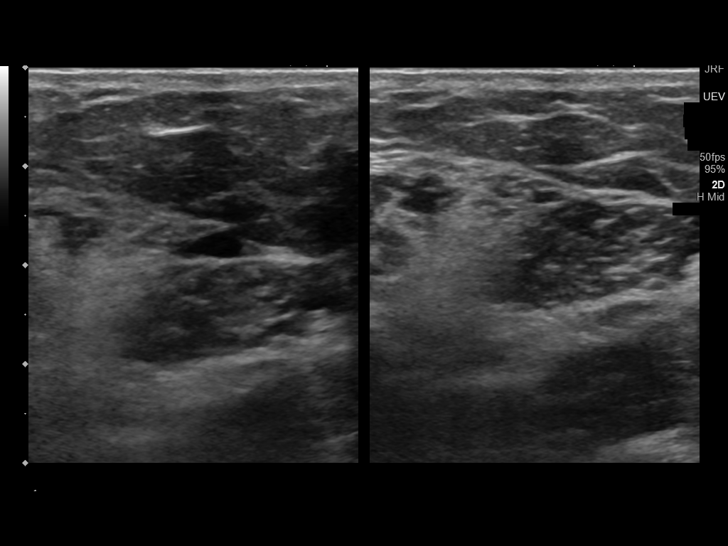
[im 14/31]
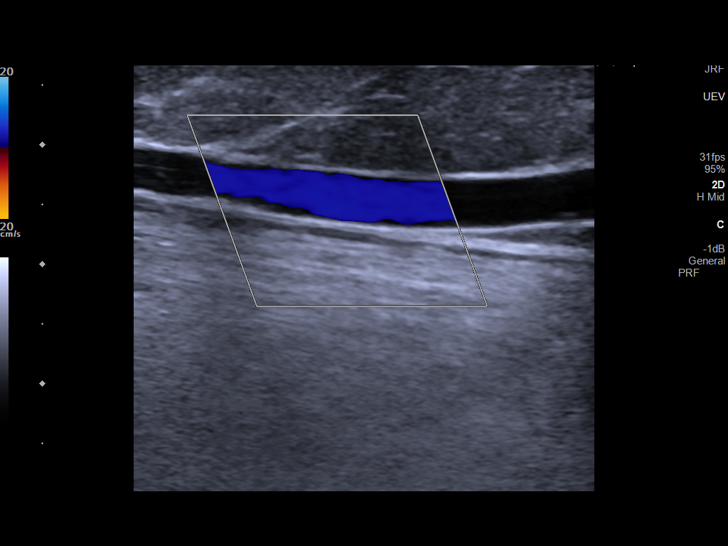
[im 16/31]
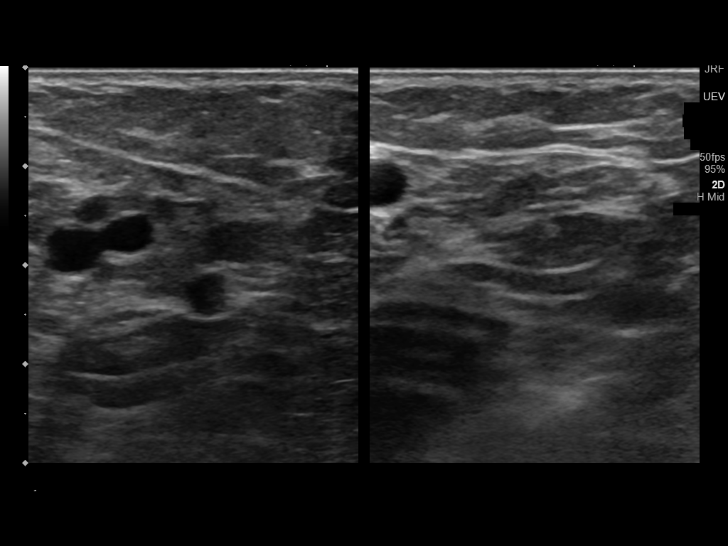
[im 17/31]
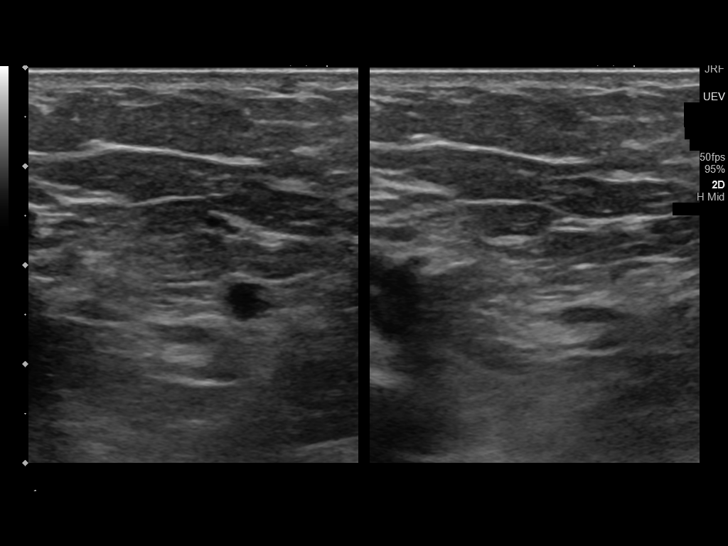
[im 20/31]
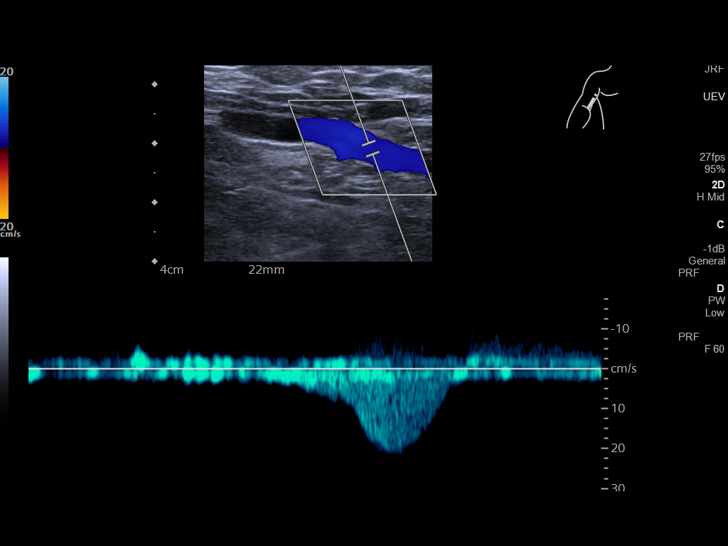
[im 23/31]
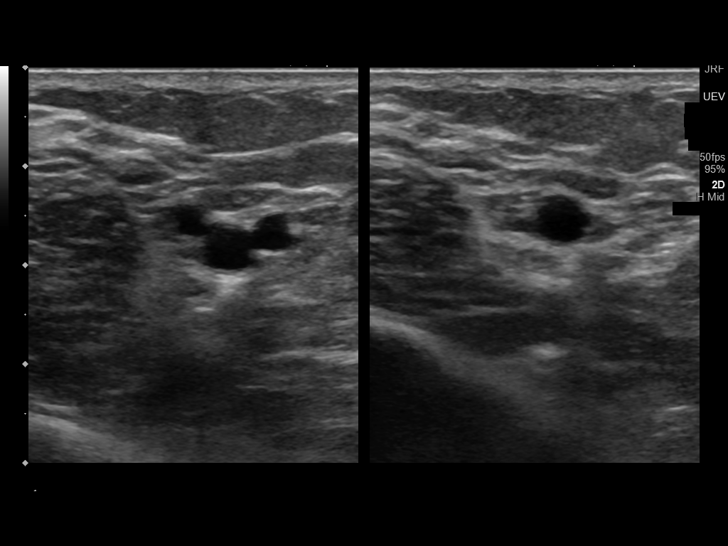
[im 25/31]
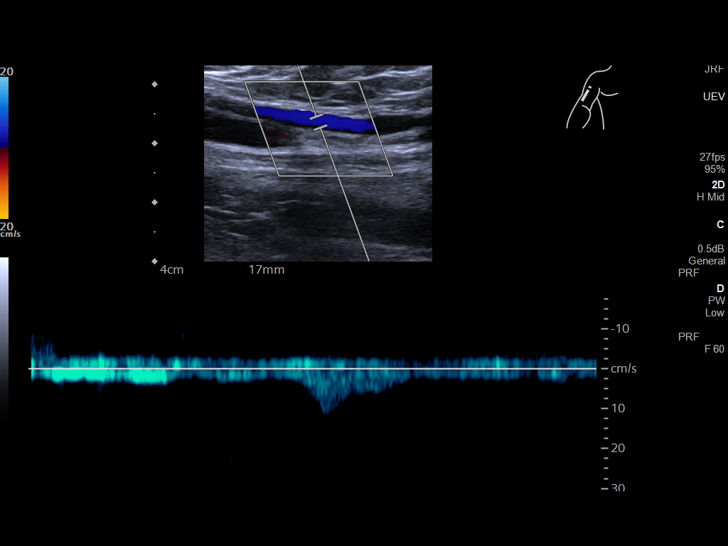
[im 28/31]
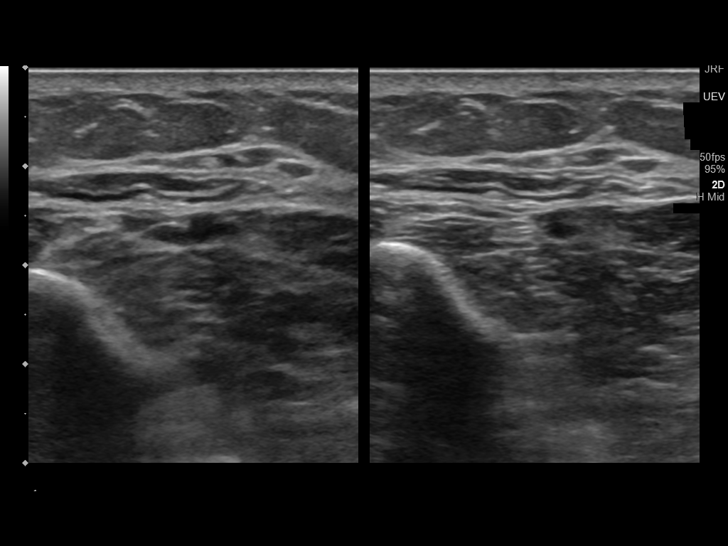
[im 31/31]
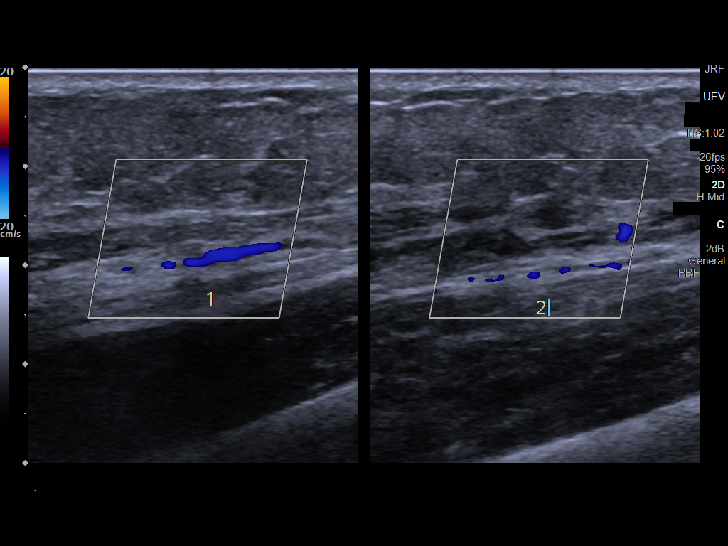

[13 of 24 positions shown; findings below may reference images not displayed]

FINDINGS: Contralateral Subclavian Vein: Respiratory phasicity is normal and
symmetric with the symptomatic side. No evidence of thrombus. Normal
compressibility.

Internal Jugular Vein: No evidence of thrombus. Normal
compressibility, respiratory phasicity and response to augmentation.

Subclavian Vein: No evidence of thrombus. Normal compressibility,
respiratory phasicity and response to augmentation.

Axillary Vein: No evidence of thrombus. Normal compressibility,
respiratory phasicity and response to augmentation.

Cephalic Vein: No evidence of thrombus. Normal compressibility,
respiratory phasicity and response to augmentation.

Basilic Vein: No evidence of thrombus. Normal compressibility,
respiratory phasicity and response to augmentation.

Brachial Veins: No evidence of thrombus. Normal compressibility,
respiratory phasicity and response to augmentation.

Radial Veins: No evidence of thrombus. Normal compressibility,
respiratory phasicity and response to augmentation.

Ulnar Veins: No evidence of thrombus. Normal compressibility,
respiratory phasicity and response to augmentation.

Venous Reflux:  None visualized.

Other Findings:  None visualized.
IMPRESSION: No evidence of DVT within the right upper extremity.

## 2022-09-16 NOTE — Telephone Encounter (Signed)
Please call [place] [facility] to see what they recommended this patient do with her eliquis for her upcoming spine injection. She noted that advised her she did not have to do anything with this.

## 2022-09-18 NOTE — Patient Outreach (Addendum)
  Care Coordination   Follow Up Visit Note   09/18/2022 Name: Valerie Vazquez MRN: [id number] DOB: May 17, 1956  Valerie Vazquez Bureau is a 67 y.o. year old female who sees Caryl Bis, Angela Adam, MD for primary care. I spoke with  Valerie Vazquez Trivedi by phone today.  What matters to the patients health and wellness today?  Ongoing management of diabetes    Goals Addressed             This Visit's Progress    Patient stated:   diabetic education and assistance with management       Care Coordination Interventions: Evaluation of current treatment plan related to diabetes and patient's adherence to plan as established by provider:  Patient states her provider increased her Ozempic medication. She states she seems to be tolerating medication only having occasional nausea. Patient states her fasting blood sugars are ranging from low 100's to 170.  She reports having fasting blood sugars of 170.  She states this was from drinking soda the night before.  Patient states her hemoglobin A1c done on 09/09/22 was 7.0 down from 7.6 .  Reviewed medications with patient and discussed importance of compliance:   Reviewed scheduled/upcoming provider appointments:  Per chart review patients next scheduled follow up visit with primary care provider is 12/10/22 Care Coordination Interventions: Discussed plans with patient for ongoing care management follow up:  Patient verbally agreed to next telephone outreach with [facility] on 11/12/22.  Recent lab work reviewed/discussed.              SDOH assessments and interventions completed:  No     Care Coordination Interventions:  Yes, provided   Follow up plan: Follow up call scheduled for 11/12/22    Encounter Outcome:  Pt. Visit Completed   Quinn Plowman RN,BSN,CCM [place] [phone] direct line

## 2022-09-19 NOTE — Telephone Encounter (Signed)
Noted  

## 2022-09-22 ENCOUNTER — Ambulatory Visit: Payer: 59 | Admitting: Physician Assistant

## 2022-09-22 DIAGNOSIS — M47816 Spondylosis without myelopathy or radiculopathy, lumbar region: Secondary | ICD-10-CM | POA: Diagnosis not present

## 2022-09-22 DIAGNOSIS — M4316 Spondylolisthesis, lumbar region: Secondary | ICD-10-CM | POA: Diagnosis not present

## 2022-09-23 DIAGNOSIS — L03031 Cellulitis of right toe: Secondary | ICD-10-CM | POA: Diagnosis not present

## 2022-09-23 DIAGNOSIS — L03032 Cellulitis of left toe: Secondary | ICD-10-CM | POA: Diagnosis not present

## 2022-09-25 ENCOUNTER — Telehealth: Payer: Self-pay | Admitting: Family Medicine

## 2022-09-25 ENCOUNTER — Other Ambulatory Visit: Payer: Self-pay

## 2022-09-25 DIAGNOSIS — E119 Type 2 diabetes mellitus without complications: Secondary | ICD-10-CM

## 2022-09-25 MED ORDER — SEMAGLUTIDE (1 MG/DOSE) 4 MG/3ML ~~LOC~~ SOPN: 1.0000 mg | PEN_INJECTOR | SUBCUTANEOUS | 2 refills | Status: DC

## 2022-09-25 NOTE — Telephone Encounter (Signed)
I am confused on this. She most recently saw neurosurgery in [place] for this and they were going to do an injection. Is she going back to [facility] instead? At this point she needs to pick one place and stick with them. Whatever office is doing the injection needs to fax a request for the information they need prior to the injection.

## 2022-09-25 NOTE — Telephone Encounter (Signed)
Prescription Request  09/25/2022  Is this a "Controlled Substance" medicine? No  LOV: 09/09/2022  What is the name of the medication or equipment? Ozempic/Semaglutide,   Have you contacted your pharmacy to request a refill? Yes   Which pharmacy would you like this sent to?   [id number] [place] - [address] (Ph: [phone]     Patient notified that their request is being sent to the clinical staff for review and that they should receive a response within 2 business days.   Please advise at Mobile [phone](mobile)

## 2022-09-25 NOTE — Telephone Encounter (Signed)
sent 

## 2022-09-25 NOTE — Telephone Encounter (Signed)
  FYI -  Dr Davy Pique [facility] in [place] Pt has been seeing them for injections - goes back 2/8. Nothing needed from our office at this time.

## 2022-09-26 ENCOUNTER — Other Ambulatory Visit: Payer: Self-pay | Admitting: Family Medicine

## 2022-09-26 DIAGNOSIS — Z1231 Encounter for screening mammogram for malignant neoplasm of breast: Secondary | ICD-10-CM

## 2022-10-01 DIAGNOSIS — E039 Hypothyroidism, unspecified: Secondary | ICD-10-CM | POA: Diagnosis not present

## 2022-10-01 DIAGNOSIS — E119 Type 2 diabetes mellitus without complications: Secondary | ICD-10-CM | POA: Diagnosis not present

## 2022-10-09 DIAGNOSIS — M47816 Spondylosis without myelopathy or radiculopathy, lumbar region: Secondary | ICD-10-CM | POA: Diagnosis not present

## 2022-10-10 DIAGNOSIS — E039 Hypothyroidism, unspecified: Secondary | ICD-10-CM | POA: Diagnosis not present

## 2022-10-10 DIAGNOSIS — E119 Type 2 diabetes mellitus without complications: Secondary | ICD-10-CM | POA: Diagnosis not present

## 2022-10-13 ENCOUNTER — Other Ambulatory Visit: Payer: Self-pay | Admitting: Family Medicine

## 2022-10-13 DIAGNOSIS — K582 Mixed irritable bowel syndrome: Secondary | ICD-10-CM

## 2022-10-16 DIAGNOSIS — L03032 Cellulitis of left toe: Secondary | ICD-10-CM | POA: Diagnosis not present

## 2022-10-16 DIAGNOSIS — L03031 Cellulitis of right toe: Secondary | ICD-10-CM | POA: Diagnosis not present

## 2022-10-20 ENCOUNTER — Ambulatory Visit
Admission: RE | Admit: 2022-10-20 | Discharge: 2022-10-20 | Disposition: A | Discharge status: Home / Self Care | Payer: 59 | Source: Ambulatory Visit | Attending: Family | Admitting: Family

## 2022-10-20 ENCOUNTER — Ambulatory Visit (INDEPENDENT_AMBULATORY_CARE_PROVIDER_SITE_OTHER): Payer: 59 | Admitting: Family

## 2022-10-20 ENCOUNTER — Encounter: Payer: Self-pay | Admitting: Family

## 2022-10-20 VITALS — BP 136/84 | HR 90 | Temp 98.1°F | Ht 66.0 in | Wt 218.6 lb

## 2022-10-20 DIAGNOSIS — J0141 Acute recurrent pansinusitis: Secondary | ICD-10-CM

## 2022-10-20 DIAGNOSIS — J341 Cyst and mucocele of nose and nasal sinus: Secondary | ICD-10-CM | POA: Diagnosis not present

## 2022-10-20 MED ORDER — DOXYCYCLINE HYCLATE 100 MG PO TABS: 100.0000 mg | ORAL_TABLET | Freq: Two times a day (BID) | ORAL | 0 refills | Status: AC

## 2022-10-20 MED ORDER — IOHEXOL 300 MG/ML  SOLN
75.0000 mL | Freq: Once | INTRAMUSCULAR | Status: AC | PRN
  Administered 2022-10-20: 75 mL via INTRAVENOUS

## 2022-10-20 NOTE — Progress Notes (Unsigned)
Assessment & Plan:  There are no diagnoses linked to this encounter.   Return precautions given.   Risks, benefits, and alternatives of the medications and treatment plan prescribed today were discussed, and patient expressed understanding.   Education regarding symptom management and diagnosis given to patient on AVS either electronically or printed.  No follow-ups on file.  Mable Paris, FNP  Subjective:    Patient ID: Valerie Vazquez, female    DOB: June 13, 1956, 67 y.o.   MRN: [id number]  CC: Valerie Vazquez is a 67 y.o. female who presents today for an acute visit.    HPI: Right swelling proximal to right ear , nasal congestion x one month Right sinus pain. Will hear non tender 'crunching sound' when chewing.  She is missing molars upper and lower. She had teeth pulled a couple of months ago.  No sensitive teeth, pus in mouth, fever, sore throat, pain with swallowing, ha, vision changes  Occasional clear runny nose, sneezing, and itchy eyes at baseline.     Compliant with benadryl 17m BID for seasonal allergies.   Treated by podiatry for paronychia by DPM 10/02/22 , treated with doxycycline . Patient states for 10 days with improvement of pain.   No h/o CKD   H/o DM, HTN, chronic DHF, CAD  Allergies: Abilify [aripiprazole], Augmentin [amoxicillin-pot clavulanate], Bactrim [sulfamethoxazole-trimethoprim], Ceftin [cefuroxime axetil], Coconut (cocos nucifera), Diclofenac, Dye fdc red [red dye], Indocin [indomethacin], Linaclotide, Lisinopril, Morphine, Morphine and related, Simvastatin, Sulfa antibiotics, Sulfamethoxazole-trimethoprim, Wellbutrin [bupropion], Zetia [ezetimibe], Quetiapine fumarate, and Quetiapine fumarate Current Outpatient Medications on File Prior to Visit  Medication Sig Dispense Refill   acetaminophen (TYLENOL) 500 MG tablet Take 1 tablet (500 mg total) by mouth every 6 (six) hours as needed. 30 tablet 0   b complex vitamins capsule Take 1 capsule by  mouth daily.     baclofen (LIORESAL) 10 MG tablet TAKE ONE TABLET BY MOUTH THREE TIMES DAILY AS NEEDED FOR MUSCLE SPASMS (VIAL) 60 tablet 11   blood glucose meter kit and supplies KIT Dispense based on patient and insurance preference. Use once daily fasting. Dx code E11.9. 1 each 0   Blood Glucose Monitoring Suppl (GHT BLOOD GLUCOSE MONITOR) w/Device KIT DISPENSE BASED ON PATIENT AND INSURANCE PREFERENCE. USE ONCE DAILY AS DIRECTED. DX CODE E11.9. 1 kit 0   dicyclomine (BENTYL) 20 MG tablet TAKE ONE TABLET BY MOUTH THREE TIMES DAILY AS NEEDED FOR ABDOMINAL SPASMS (VIAL) 270 tablet 11   diphenhydrAMINE (BENADRYL) 25 MG tablet Take 1 tablet (25 mg total) by mouth every 6 (six) hours. 12 tablet 0   ELIQUIS 5 MG TABS tablet TAKE ONE TABLET BY MOUTH TWICE DAILY @9AM$  & 5PM 60 tablet 11   escitalopram (LEXAPRO) 20 MG tablet TAKE ONE TABLET BY MOUTH DAILY AT 9AM 90 tablet 2   famotidine (PEPCID) 20 MG tablet TAKE ONE TABLET BY MOUTH DAILY AT 9PM AT BEDTIME 90 tablet 11   furosemide (LASIX) 20 MG tablet TAKE ONE TABLET BY MOUTH DAILY AS NEEDED for edema. (VIAL) 90 tablet 11   gabapentin (NEURONTIN) 600 MG tablet TAKE ONE TABLET BY MOUTH DAILY AT 9AM and TAKE 1 AND 1/2 TABLETS DAILY AT 9PM AT BEDTIME 225 tablet 11   isosorbide mononitrate (IMDUR) 30 MG 24 hr tablet TAKE ONE TABLET BY MOUTH DAILY AT 9AM 90 tablet 2   JARDIANCE 25 MG TABS tablet TAKE ONE TABLET BY MOUTH DAILY AT 9AM 90 tablet 1   levothyroxine (SYNTHROID) 25 MCG tablet TAKE ONE TABLET  BY MOUTH DAILY AT 7AM BEFORE BREAKFAST AND 2-3 HOURS BEFORE TAKING ANY OTHER MEDICTION OR SUPPLEMENT 90 tablet 3   nitroGLYCERIN (NITROSTAT) 0.4 MG SL tablet Place 1 tablet (0.4 mg total) under the tongue every 5 (five) minutes as needed for chest pain. 25 tablet 3   ondansetron (ZOFRAN-ODT) 4 MG disintegrating tablet Take 1 tablet (4 mg total) by mouth every 6 (six) hours as needed for nausea. 20 tablet 0   OVER THE COUNTER MEDICATION 1,500 mg daily. Yellowdock  Root     OVER THE COUNTER MEDICATION 200 mg daily. Magnesium Citrate Gummy     OVER THE COUNTER MEDICATION Power C 2 gummies daily     oxyCODONE-acetaminophen (PERCOCET/ROXICET) 5-325 MG tablet Take 1 tablet by mouth every 6 (six) hours as needed (breakthrough pain). 15 tablet 0   pantoprazole (PROTONIX) 40 MG tablet TAKE ONE TABLET BY MOUTH DAILY AT 9AM 72 tablet 11   potassium chloride SA (KLOR-CON M) 20 MEQ tablet TAKE ONE TABLET BY MOUTH DAILY AS NEEDED TAKE WITH LASIX (VIAL) 90 tablet 11   rosuvastatin (CRESTOR) 40 MG tablet TAKE 1 TABLET BY MOUTH EVERY DAY 90 tablet 1   Semaglutide, 1 MG/DOSE, 4 MG/3ML SOPN Inject 1 mg as directed once a week. 9 mL 2   Vitamin A 2400 MCG (8000 UT) TABS Take by mouth daily.     vitamin E 180 MG (400 UNITS) capsule Take 400 Units by mouth daily.     Zinc Sulfate (ZINC 15 PO) Take by mouth daily.     No current facility-administered medications on file prior to visit.    Review of Systems    Objective:    BP 136/84   Pulse 90   Temp 98.1 F (36.7 C) (Oral)   Ht 5' 6"$  (1.676 m)   Wt 218 lb 9.6 oz (99.2 kg)   SpO2 95%   BMI 35.28 kg/m   BP Readings from Last 3 Encounters:  10/20/22 136/84  09/09/22 110/70  06/09/22 118/78   Wt Readings from Last 3 Encounters:  10/20/22 218 lb 9.6 oz (99.2 kg)  09/09/22 221 lb (100.2 kg)  06/09/22 224 lb (101.6 kg)    Physical Exam

## 2022-10-20 NOTE — Patient Instructions (Signed)
Start doxycycline   Ensure to take probiotics while on antibiotics and also for 2 weeks after completion. This can either be by eating yogurt daily or taking a probiotic supplement over the counter such as Culturelle.It is important to re-colonize the gut with good bacteria and also to prevent any diarrheal infections associated with antibiotic use.   I have ordered CT sinus Let us know if you dont hear back within a week in regards to an appointment being scheduled.   So that you are aware, if you are [facility] MyChart user , please pay attention to your MyChart messages as you may receive a MyChart message with a phone number to call and schedule this test/appointment own your own from our referral coordinator. This is a new process so I do not want you to miss this message.  If you are not a MyChart user, you will receive a phone call.

## 2022-10-21 NOTE — Assessment & Plan Note (Addendum)
Patient afebrile and nontoxic in appearance.  Differential includes sinusitis, parotiditis.  Less likely dental abscess, TMJ.  Start doxycycline.  Pending CT sinus. Consider ENT consult.

## 2022-10-24 ENCOUNTER — Encounter: Payer: Self-pay | Admitting: Family Medicine

## 2022-10-24 ENCOUNTER — Telehealth: Payer: Self-pay | Admitting: Family Medicine

## 2022-10-24 MED ORDER — ESCITALOPRAM OXALATE 20 MG PO TABS: ORAL_TABLET | ORAL | 0 refills | Status: DC

## 2022-10-24 MED ORDER — ESCITALOPRAM OXALATE 20 MG PO TABS: ORAL_TABLET | ORAL | 0 refills | Status: AC

## 2022-10-24 MED ORDER — OLANZAPINE 10 MG PO TABS: 10.0000 mg | ORAL_TABLET | Freq: Every day | ORAL | 0 refills | Status: DC

## 2022-10-24 NOTE — Telephone Encounter (Signed)
Note sent to Dr Caryl Bis to approve temporary refill

## 2022-10-24 NOTE — Telephone Encounter (Signed)
I sent the lexapro in for her. I do not see olanzapine on her medication list at this time. Please contact her to determine her dose.

## 2022-10-24 NOTE — Telephone Encounter (Signed)
Pt called stating she does not have a provider for her psych meds and [employer] is helping her find another one. Pt need a refill on escitalopram  and [facility]apine [facility] [place]

## 2022-10-24 NOTE — Telephone Encounter (Signed)
Sent to pharmacy. Future refills need to come from psychiatry.

## 2022-10-27 ENCOUNTER — Ambulatory Visit
Admission: RE | Admit: 2022-10-27 | Discharge: 2022-10-27 | Disposition: A | Discharge status: Home / Self Care | Payer: 59 | Source: Ambulatory Visit | Attending: Family Medicine | Admitting: Family Medicine

## 2022-10-27 DIAGNOSIS — Z1231 Encounter for screening mammogram for malignant neoplasm of breast: Secondary | ICD-10-CM | POA: Diagnosis not present

## 2022-10-27 NOTE — Telephone Encounter (Signed)
Left message to call the office back.

## 2022-10-28 NOTE — Telephone Encounter (Signed)
Sent a my chart message.

## 2022-10-29 ENCOUNTER — Other Ambulatory Visit: Payer: Self-pay | Admitting: Family Medicine

## 2022-10-29 DIAGNOSIS — R928 Other abnormal and inconclusive findings on diagnostic imaging of breast: Secondary | ICD-10-CM

## 2022-10-29 DIAGNOSIS — N6489 Other specified disorders of breast: Secondary | ICD-10-CM

## 2022-10-30 DIAGNOSIS — E039 Hypothyroidism, unspecified: Secondary | ICD-10-CM | POA: Diagnosis not present

## 2022-10-30 DIAGNOSIS — E119 Type 2 diabetes mellitus without complications: Secondary | ICD-10-CM | POA: Diagnosis not present

## 2022-11-03 ENCOUNTER — Encounter: Payer: Self-pay | Admitting: Family

## 2022-11-03 ENCOUNTER — Ambulatory Visit: Payer: 59 | Admitting: Family Medicine

## 2022-11-03 ENCOUNTER — Ambulatory Visit (INDEPENDENT_AMBULATORY_CARE_PROVIDER_SITE_OTHER): Payer: 59 | Admitting: Family

## 2022-11-03 VITALS — BP 120/74 | HR 91 | Temp 98.2°F | Ht 66.0 in | Wt 217.2 lb

## 2022-11-03 DIAGNOSIS — J341 Cyst and mucocele of nose and nasal sinus: Secondary | ICD-10-CM | POA: Diagnosis not present

## 2022-11-03 MED ORDER — FLUTICASONE PROPIONATE 50 MCG/ACT NA SUSP: 2.0000 | Freq: Every day | NASAL | 6 refills | Status: DC

## 2022-11-03 NOTE — Progress Notes (Signed)
Assessment & Plan:  Retention cyst of nasal sinus Assessment & Plan: Chronic sinusitis.  Right-sided turbinate does appear larger than left.  Bilateral retention cyst seen on CT sinus.  Start Flonase.  Continue Benadryl.  Referral to ENT for further evaluation of retention cysts, sinusitis.   Orders: -     Ambulatory referral to ENT -     Fluticasone Propionate; Place 2 sprays into both nostrils daily.  Dispense: 16 g; Refill: 6     Return precautions given.   Risks, benefits, and alternatives of the medications and treatment plan prescribed today were discussed, and patient expressed understanding.   Education regarding symptom management and diagnosis given to patient on AVS either electronically or printed.  Return if symptoms worsen or fail to improve.  Mable Paris, FNP  Subjective:    Patient ID: [name], female    DOB: 01/08/1956, 67 y.o.   MRN: [id number]  CC: LASHONDRIA SILVERWOOD is a 67 y.o. female who presents today for follow up.   HPI: Follow-up sinusitis.  Started doxycycline 15 days ago.  She has had improvement in nasal congestion.  It is no longer thick and colored.  Describes clear runny nasal congestion.  No cough, shortness of breath, sore throat.   She has felt small mass in right nostril. It is not bleeding, non tender. Feels 'like a balloon' for years.    CT sinus which showed clear sinuses negative for mass or inflammation.  Small retention cyst in the bilateral maxillary sinus    No h/o skin cancer.      She takes benadryl '25mg'$  bid.    Allergies: Abilify [aripiprazole], Augmentin [amoxicillin-pot clavulanate], Bactrim [sulfamethoxazole-trimethoprim], Ceftin [cefuroxime axetil], Coconut (cocos nucifera), Diclofenac, Dye fdc red [red dye], Indocin [indomethacin], Linaclotide, Lisinopril, Morphine, Morphine and related, Simvastatin, Sulfa antibiotics, Sulfamethoxazole-trimethoprim, Wellbutrin [bupropion], Zetia [ezetimibe], Quetiapine  fumarate, and Quetiapine fumarate Current Outpatient Medications on File Prior to Visit  Medication Sig Dispense Refill   acetaminophen (TYLENOL) 500 MG tablet Take 1 tablet (500 mg total) by mouth every 6 (six) hours as needed. 30 tablet 0   b complex vitamins capsule Take 1 capsule by mouth daily.     baclofen (LIORESAL) 10 MG tablet TAKE ONE TABLET BY MOUTH THREE TIMES DAILY AS NEEDED FOR MUSCLE SPASMS (VIAL) 60 tablet 11   blood glucose meter kit and supplies KIT Dispense based on patient and insurance preference. Use once daily fasting. Dx code E11.9. 1 each 0   Blood Glucose Monitoring Suppl (GHT BLOOD GLUCOSE MONITOR) w/Device KIT DISPENSE BASED ON PATIENT AND INSURANCE PREFERENCE. USE ONCE DAILY AS DIRECTED. DX CODE E11.9. 1 kit 0   dicyclomine (BENTYL) 20 MG tablet TAKE ONE TABLET BY MOUTH THREE TIMES DAILY AS NEEDED FOR ABDOMINAL SPASMS (VIAL) 270 tablet 11   diphenhydrAMINE (BENADRYL) 25 MG tablet Take 1 tablet (25 mg total) by mouth every 6 (six) hours. 12 tablet 0   ELIQUIS 5 MG TABS tablet TAKE ONE TABLET BY MOUTH TWICE DAILY '@9AM'$  & 5PM 60 tablet 11   escitalopram (LEXAPRO) 20 MG tablet TAKE ONE TABLET BY MOUTH DAILY AT 9AM 90 tablet 0   famotidine (PEPCID) 20 MG tablet TAKE ONE TABLET BY MOUTH DAILY AT 9PM AT BEDTIME 90 tablet 11   furosemide (LASIX) 20 MG tablet TAKE ONE TABLET BY MOUTH DAILY AS NEEDED for edema. (VIAL) 90 tablet 11   gabapentin (NEURONTIN) 600 MG tablet TAKE ONE TABLET BY MOUTH DAILY AT 9AM and TAKE 1 AND 1/2 TABLETS  DAILY AT 9PM AT BEDTIME 225 tablet 11   isosorbide mononitrate (IMDUR) 30 MG 24 hr tablet TAKE ONE TABLET BY MOUTH DAILY AT 9AM 90 tablet 2   JARDIANCE 25 MG TABS tablet TAKE ONE TABLET BY MOUTH DAILY AT 9AM 90 tablet 1   levothyroxine (SYNTHROID) 25 MCG tablet TAKE ONE TABLET BY MOUTH DAILY AT 7AM BEFORE BREAKFAST AND 2-3 HOURS BEFORE TAKING ANY OTHER MEDICTION OR SUPPLEMENT 90 tablet 3   nitroGLYCERIN (NITROSTAT) 0.4 MG SL tablet Place 1 tablet (0.4  mg total) under the tongue every 5 (five) minutes as needed for chest pain. 25 tablet 3   OLANZapine (ZYPREXA) 10 MG tablet Take 1 tablet (10 mg total) by mouth at bedtime. 30 tablet 0   ondansetron (ZOFRAN-ODT) 4 MG disintegrating tablet Take 1 tablet (4 mg total) by mouth every 6 (six) hours as needed for nausea. 20 tablet 0   OVER THE COUNTER MEDICATION 1,500 mg daily. Yellowdock Root     OVER THE COUNTER MEDICATION 200 mg daily. Magnesium Citrate Gummy     OVER THE COUNTER MEDICATION Power C 2 gummies daily     oxyCODONE-acetaminophen (PERCOCET/ROXICET) 5-325 MG tablet Take 1 tablet by mouth every 6 (six) hours as needed (breakthrough pain). 15 tablet 0   pantoprazole (PROTONIX) 40 MG tablet TAKE ONE TABLET BY MOUTH DAILY AT 9AM 72 tablet 11   potassium chloride SA (KLOR-CON M) 20 MEQ tablet TAKE ONE TABLET BY MOUTH DAILY AS NEEDED TAKE WITH LASIX (VIAL) 90 tablet 11   rosuvastatin (CRESTOR) 40 MG tablet TAKE 1 TABLET BY MOUTH EVERY DAY 90 tablet 1   Semaglutide, 1 MG/DOSE, 4 MG/3ML SOPN Inject 1 mg as directed once a week. 9 mL 2   Vitamin A 2400 MCG (8000 UT) TABS Take by mouth daily.     vitamin E 180 MG (400 UNITS) capsule Take 400 Units by mouth daily.     Zinc Sulfate (ZINC 15 PO) Take by mouth daily.     No current facility-administered medications on file prior to visit.    Review of Systems  Constitutional:  Negative for chills and fever.  HENT:  Positive for congestion and postnasal drip. Negative for sinus pressure, sinus pain and trouble swallowing.   Respiratory:  Negative for cough.   Cardiovascular:  Negative for chest pain and palpitations.  Gastrointestinal:  Negative for nausea and vomiting.      Objective:    BP 120/74   Pulse 91   Temp 98.2 F (36.8 C)   Ht '5\' 6"'$  (1.676 m)   Wt 217 lb 3.2 oz (98.5 kg)   SpO2 97%   BMI 35.06 kg/m  BP Readings from Last 3 Encounters:  11/03/22 120/74  10/20/22 136/84  09/09/22 110/70   Wt Readings from Last 3  Encounters:  11/03/22 217 lb 3.2 oz (98.5 kg)  10/20/22 218 lb 9.6 oz (99.2 kg)  09/09/22 221 lb (100.2 kg)    Physical Exam Vitals reviewed.  Constitutional:      Appearance: She is well-developed.  HENT:     Head: Normocephalic and atraumatic.     Right Ear: Hearing, tympanic membrane, ear canal and external ear normal. No decreased hearing noted. No drainage, swelling or tenderness. No middle ear effusion. No foreign body. Tympanic membrane is not erythematous or bulging.     Left Ear: Hearing, tympanic membrane, ear canal and external ear normal. No decreased hearing noted. No drainage, swelling or tenderness.  No middle ear effusion. No foreign  body. Tympanic membrane is not erythematous or bulging.     Nose: Nose normal. No rhinorrhea.     Right Turbinates: Enlarged.     Left Turbinates: Not enlarged.     Right Sinus: No maxillary sinus tenderness or frontal sinus tenderness.     Left Sinus: No maxillary sinus tenderness or frontal sinus tenderness.     Comments: Right-sided turbinate does appear larger than left.  No discrete mass.  No bleeding or tender lesion    Mouth/Throat:     Pharynx: Uvula midline. No oropharyngeal exudate or posterior oropharyngeal erythema.     Tonsils: No tonsillar abscesses.  Eyes:     Conjunctiva/sclera: Conjunctivae normal.  Cardiovascular:     Rate and Rhythm: Regular rhythm.     Pulses: Normal pulses.     Heart sounds: Normal heart sounds.  Pulmonary:     Effort: Pulmonary effort is normal.     Breath sounds: Normal breath sounds. No wheezing, rhonchi or rales.  Lymphadenopathy:     Head:     Right side of head: No submental, submandibular, tonsillar, preauricular, posterior auricular or occipital adenopathy.     Left side of head: No submental, submandibular, tonsillar, preauricular, posterior auricular or occipital adenopathy.     Cervical: No cervical adenopathy.  Skin:    General: Skin is warm and dry.  Neurological:     Mental  Status: She is alert.  Psychiatric:        Speech: Speech normal.        Behavior: Behavior normal.        Thought Content: Thought content normal.

## 2022-11-03 NOTE — Assessment & Plan Note (Signed)
Chronic sinusitis.  Right-sided turbinate does appear larger than left.  Bilateral retention cyst seen on CT sinus.  Start Flonase.  Continue Benadryl.  Referral to ENT for further evaluation of retention cysts, sinusitis.

## 2022-11-03 NOTE — Patient Instructions (Signed)
You may continue Benadryl which is an antihistamine.  I have sent in a prescription for Flonase which is a low-dose steroid.  I have also placed a referral to [facility]  Let us know if you dont hear back within a week in regards to an appointment being scheduled.   So that you are aware, if you are [facility] MyChart user , please pay attention to your MyChart messages as you may receive a MyChart message with a phone number to call and schedule this test/appointment own your own from our referral coordinator. This is a new process so I do not want you to miss this message.  If you are not a MyChart user, you will receive a phone call.

## 2022-11-05 ENCOUNTER — Ambulatory Visit
Admission: RE | Admit: 2022-11-05 | Discharge: 2022-11-05 | Disposition: A | Discharge status: Home / Self Care | Payer: 59 | Source: Ambulatory Visit | Attending: Family Medicine | Admitting: Family Medicine

## 2022-11-05 DIAGNOSIS — N6489 Other specified disorders of breast: Secondary | ICD-10-CM

## 2022-11-05 DIAGNOSIS — R922 Inconclusive mammogram: Secondary | ICD-10-CM | POA: Diagnosis not present

## 2022-11-05 DIAGNOSIS — R928 Other abnormal and inconclusive findings on diagnostic imaging of breast: Secondary | ICD-10-CM | POA: Diagnosis not present

## 2022-11-09 DIAGNOSIS — E119 Type 2 diabetes mellitus without complications: Secondary | ICD-10-CM | POA: Diagnosis not present

## 2022-11-09 DIAGNOSIS — E039 Hypothyroidism, unspecified: Secondary | ICD-10-CM | POA: Diagnosis not present

## 2022-11-12 ENCOUNTER — Ambulatory Visit: Payer: Self-pay

## 2022-11-12 NOTE — Patient Outreach (Signed)
  Care Coordination   Follow Up Visit Note   11/12/2022 Name: Valerie Vazquez MRN: [id number] DOB: 1956-08-25  Valerie Vazquez is a 67 y.o. year old female who sees Caryl Bis, Angela Adam, MD for primary care. I spoke with  Valerie Vazquez by phone today.  What matters to the patients health and wellness today?  Patient reports blood sugars are ranging from 90's to 175.  She reports having a couple of days of blood sugars being in the 140's/ 170's due to food choices.  Patient reports today's fasting blood sugar result was 145.  She contributes this to eating a strawberry short cake.        Goals Addressed             This Visit's Progress    Patient stated:   diabetic education and assistance with management       Interventions Today    Flowsheet Row Most Recent Value  Chronic Disease   Chronic disease during today's visit Diabetes  [Evaluation of current treatment plan related to diabetes and patients adherence to plan as established by provider]  General Interventions   General Interventions Discussed/Reviewed General Interventions Reviewed, Labs, Doctor Visits  [facility] assessed home blood sugar readings.  Assessed for blood sugars <70.]  Labs Hgb A1c annually  [Discussed recent Hgb A1c results.]  Doctor Visits Discussed/Reviewed Doctor Visits Reviewed  Jolinda Croak scheduled / upcoming provider visits]  Exercise Interventions   Exercise Discussed/Reviewed Physical Activity  Physical Activity Discussed/Reviewed Physical Activity Reviewed  [Discussed importance of exercise and/ or increasing activity as tolerated.]  Nutrition Interventions   Nutrition Discussed/Reviewed Nutrition Reviewed  [encouraged patient to limit foods high in added sugars from diet.  Pick more unprocessed carbohydrates, whole grain, brown rice]  Pharmacy Interventions   Pharmacy Dicussed/Reviewed Pharmacy Topics Reviewed  [medications reviewed and compliance discussed.]                  SDOH  assessments and interventions completed:  No     Care Coordination Interventions:  Yes, provided   Follow up plan: Follow up call scheduled for 12/30/22    Encounter Outcome:  Pt. Visit Completed   Quinn Plowman RN,BSN,CCM [facility] [phone] direct line

## 2022-11-17 ENCOUNTER — Other Ambulatory Visit: Payer: Self-pay | Admitting: Family Medicine

## 2022-11-17 NOTE — Telephone Encounter (Signed)
Okay to extend refill? Pt requesting a 90 day supply.  Last filled on 10/24/22 for #30 and no refills.

## 2022-11-22 ENCOUNTER — Encounter: Payer: Self-pay | Admitting: Family Medicine

## 2022-11-24 ENCOUNTER — Encounter: Payer: Self-pay | Admitting: Gastroenterology

## 2022-11-24 ENCOUNTER — Ambulatory Visit: Payer: 59 | Admitting: Family

## 2022-11-24 ENCOUNTER — Ambulatory Visit (INDEPENDENT_AMBULATORY_CARE_PROVIDER_SITE_OTHER): Payer: 59 | Admitting: Gastroenterology

## 2022-11-24 VITALS — BP 114/72 | HR 91 | Temp 98.5°F | Ht 66.0 in | Wt 213.5 lb

## 2022-11-24 DIAGNOSIS — R1013 Epigastric pain: Secondary | ICD-10-CM | POA: Diagnosis not present

## 2022-11-24 MED ORDER — OMEPRAZOLE 40 MG PO CPDR: 40.0000 mg | DELAYED_RELEASE_CAPSULE | Freq: Every day | ORAL | 3 refills | Status: DC

## 2022-11-24 NOTE — Progress Notes (Signed)
[name] MD, MRCP(U.K) [address]  [place]  [place], [place] [address]  Main: [phone]  Fax: [phone]   Primary Care Physician: [name], MD  Primary Gastroenterologist:  Dr. [name]   Chief Complaint  Patient presents with   Follow-up    HPI: Valerie Vazquez is a 67 y.o. 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 Constipation resolved with Rx and dysphagia resolved with PPI  Colonoscopy 02/17/17 - 6 mm serrated adenoma resected. CT abdomen in 09/2016 showed no abnormalities of the liver .  02/16/2019: H pylori stool antigen negative 03/07/2019: EGD: gastric bx - gastritis  S/p cholecystectomy in 2002. 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   09/19/2020 : Underwent a CT scan for hematuria work up and was found to incidentally have Mild wall thickening with adjacent inflammation involving the sigmoid colon, which may represent a mild infectious or inflammatory colitis.    Interval history 11/22/2020-11/24/2022 She says she is doing well since her last visit no issues with constipation.  Having regular bowel movements recently been having some epigastric burning like sensation which is relieved right after she eats.  She is taking not had complete relief hence would like something different.  No relief of the pain with bowel movements.  No other complaints.  Current Outpatient Medications  Medication Sig Dispense Refill   acetaminophen (TYLENOL) 500 MG tablet Take 1 tablet (500 mg total) by mouth every 6 (six) hours as needed. 30 tablet 0   b complex vitamins capsule Take 1 capsule by mouth daily.     baclofen (LIORESAL) 10 MG tablet TAKE ONE TABLET BY MOUTH THREE TIMES DAILY AS  NEEDED FOR MUSCLE SPASMS (VIAL) 60 tablet 11   blood glucose meter kit and supplies KIT Dispense based on patient and insurance preference. Use once daily fasting. Dx code E11.9. 1 each 0   Blood Glucose Monitoring Suppl (GHT BLOOD GLUCOSE MONITOR) w/Device KIT DISPENSE BASED ON PATIENT AND INSURANCE PREFERENCE. USE ONCE DAILY AS DIRECTED. DX CODE E11.9. 1 kit 0   dicyclomine (BENTYL) 20 MG tablet TAKE ONE TABLET BY MOUTH THREE TIMES DAILY AS NEEDED FOR ABDOMINAL SPASMS (VIAL) 270 tablet 11   diphenhydrAMINE (BENADRYL) 25 MG tablet Take 1 tablet (25 mg total) by mouth every 6 (six) hours. 12 tablet 0   ELIQUIS 5 MG TABS tablet TAKE ONE TABLET BY MOUTH TWICE DAILY @9AM  & 5PM 60 tablet 11   escitalopram (LEXAPRO) 20 MG tablet TAKE ONE TABLET BY MOUTH DAILY AT 9AM 90 tablet 0   famotidine (PEPCID) 20 MG tablet TAKE ONE TABLET BY MOUTH DAILY AT 9PM AT BEDTIME 90 tablet 11   fluticasone (FLONASE) 50 MCG/ACT nasal spray Place 2 sprays into both nostrils daily. 16 g 6   furosemide (LASIX) 20 MG tablet TAKE ONE TABLET BY MOUTH DAILY AS NEEDED for edema. (VIAL) 90 tablet 11   gabapentin (NEURONTIN) 600 MG tablet TAKE ONE TABLET BY MOUTH DAILY AT 9AM and TAKE 1 AND 1/2 TABLETS DAILY AT 9PM AT BEDTIME 225 tablet 11   isosorbide mononitrate (IMDUR) 30 MG 24 hr tablet TAKE ONE TABLET BY MOUTH DAILY AT 9AM 90 tablet 2   JARDIANCE 25 MG TABS tablet TAKE ONE TABLET BY MOUTH DAILY  AT 9AM 90 tablet 1   levothyroxine (SYNTHROID) 25 MCG tablet TAKE ONE TABLET BY MOUTH DAILY AT 7AM BEFORE BREAKFAST AND 2-3 HOURS BEFORE TAKING ANY OTHER MEDICTION OR SUPPLEMENT 90 tablet 3   nitroGLYCERIN (NITROSTAT) 0.4 MG SL tablet Place 1 tablet (0.4 mg total) under the tongue every 5 (five) minutes as needed for chest pain. 25 tablet 3   OLANZapine (ZYPREXA) 10 MG tablet TAKE 1 TABLET BY MOUTH EVERYDAY AT BEDTIME 90 tablet 0   ondansetron (ZOFRAN-ODT) 4 MG disintegrating tablet Take 1 tablet (4 mg total) by mouth every 6 (six) hours as  needed for nausea. 20 tablet 0   OVER THE COUNTER MEDICATION 1,500 mg daily. Yellowdock Root     OVER THE COUNTER MEDICATION 200 mg daily. Magnesium Citrate Gummy     OVER THE COUNTER MEDICATION Power C 2 gummies daily     oxyCODONE-acetaminophen (PERCOCET/ROXICET) 5-325 MG tablet Take 1 tablet by mouth every 6 (six) hours as needed (breakthrough pain). 15 tablet 0   pantoprazole (PROTONIX) 40 MG tablet TAKE ONE TABLET BY MOUTH DAILY AT 9AM 72 tablet 11   potassium chloride SA (KLOR-CON M) 20 MEQ tablet TAKE ONE TABLET BY MOUTH DAILY AS NEEDED TAKE WITH LASIX (VIAL) 90 tablet 11   rosuvastatin (CRESTOR) 40 MG tablet TAKE 1 TABLET BY MOUTH EVERY DAY 90 tablet 1   Semaglutide, 1 MG/DOSE, 4 MG/3ML SOPN Inject 1 mg as directed once a week. 9 mL 2   Vitamin A 2400 MCG (8000 UT) TABS Take by mouth daily.     vitamin E 180 MG (400 UNITS) capsule Take 400 Units by mouth daily.     Zinc Sulfate (ZINC 15 PO) Take by mouth daily.     No current facility-administered medications for this visit.    Allergies as of 11/24/2022 - Review Complete 11/03/2022  Allergen Reaction Noted   Abilify [aripiprazole] Other (See Comments) 09/20/2013   Augmentin [amoxicillin-pot clavulanate] Diarrhea and Nausea And Vomiting 09/20/2013   Bactrim [sulfamethoxazole-trimethoprim] Diarrhea 09/20/2013   Ceftin [cefuroxime axetil] Diarrhea 09/20/2013   Coconut (cocos nucifera)  10/19/2015   Diclofenac Other (See Comments) 08/01/2014   Dye fdc red [red dye]  01/22/2016   Indocin [indomethacin] Other (See Comments) 09/20/2013   Linaclotide Diarrhea 12/09/2014   Lisinopril Diarrhea and Other (See Comments) 09/20/2013   Morphine Other (See Comments) 12/09/2014   Morphine and related Other (See Comments) 09/20/2013   Simvastatin Other (See Comments) 08/01/2014   Sulfa antibiotics Diarrhea 12/09/2014   Sulfamethoxazole-trimethoprim Diarrhea 12/09/2014   Wellbutrin [bupropion] Other (See Comments) 09/20/2013   Zetia  [ezetimibe]  09/18/2017   Quetiapine fumarate Palpitations 08/01/2014   Quetiapine fumarate Palpitations 08/16/2014    ROS:  General: Negative for anorexia, weight loss, fever, chills, fatigue, weakness. ENT: Negative for hoarseness, difficulty swallowing , nasal congestion. CV: Negative for chest pain, angina, palpitations, dyspnea on exertion, peripheral edema.  Respiratory: Negative for dyspnea at rest, dyspnea on exertion, cough, sputum, wheezing.  GI: See history of present illness. GU:  Negative for dysuria, hematuria, urinary incontinence, urinary frequency, nocturnal urination.  Endo: Negative for unusual weight change.    Physical Examination:   BP 114/72   Pulse 91   Temp 98.5 F (36.9 C) (Oral)   Ht 5\' 6"  (1.676 m)   Wt 213 lb 8 oz (96.8 kg)   BMI 34.46 kg/m   General: Well-nourished, well-developed in no acute distress.  Eyes: No icterus. Conjunctivae pink. Mouth: Oropharyngeal mucosa moist and pink , no lesions  erythema or exudate. Neuro: Alert and oriented x 3.  Grossly intact. Skin: Warm and dry, no jaundice.   Psych: Alert and cooperative, normal mood and affect.   Imaging Studies: MM DIAG BREAST TOMO UNI LEFT  Result Date: 11/05/2022 CLINICAL DATA:  Screening recall for possible left breast asymmetry EXAM: DIGITAL DIAGNOSTIC UNILATERAL LEFT MAMMOGRAM WITH TOMOSYNTHESIS TECHNIQUE: Left digital diagnostic mammography and breast tomosynthesis was performed. COMPARISON:  Previous exam(s). ACR Breast Density Category b: There are scattered areas of fibroglandular density. FINDINGS: The previously described asymmetry in the central left breast does not persist with additional views, consistent with superimposed fibroglandular tissue. The configuration of fibroglandular tissue appears unchanged since at least Jan 05, 2014. No suspicious mass, microcalcification, or other finding is identified. IMPRESSION: The previously described asymmetry in the left breast does not  persist with additional views, consistent with superimposed fibroglandular tissue. No mammographic evidence of malignancy in the left breast. RECOMMENDATION: Return to routine screening mammography is recommended. The patient will be due for screening in February 2025. I have discussed the findings and recommendations with the patient. If applicable, a reminder letter will be sent to the patient regarding the next appointment. BI-RADS CATEGORY  1: Negative. Electronically Signed   By: Beryle Flock M.D.   On: 11/05/2022 14:37  MM 3D SCREEN BREAST BILATERAL  Result Date: 10/29/2022 CLINICAL DATA:  Screening. EXAM: DIGITAL SCREENING BILATERAL MAMMOGRAM WITH TOMOSYNTHESIS AND CAD TECHNIQUE: Bilateral screening digital craniocaudal and mediolateral oblique mammograms were obtained. Bilateral screening digital breast tomosynthesis was performed. The images were evaluated with computer-aided detection. COMPARISON:  Previous exam(s). ACR Breast Density Category c: The breasts are heterogeneously dense, which may obscure small masses. FINDINGS: In the left breast, a possible asymmetry warrants further evaluation. In the right breast, no findings suspicious for malignancy. IMPRESSION: Further evaluation is suggested for possible asymmetry in the left breast. RECOMMENDATION: Diagnostic mammogram and possibly ultrasound of the left breast. (Code:[id number]) The patient will be contacted regarding the findings, and additional imaging will be scheduled. BI-RADS CATEGORY  0: Incomplete: Need additional imaging evaluation. Electronically Signed   By: Kristopher Oppenheim M.D.   On: 10/29/2022 11:43    Assessment and Plan:   Valerie Vazquez is a 67 y.o. y/o female being seen in for IBS constipation causing abdominal pain.  History of possible excessive use of enemas and concern for bulimia.  Constipation treated with Linzess in the past.  Present issue of epigastric discomfort with a sense of burning relieved after eating over  the last few weeks not relieved with famotidine.  History suggestive of duodenitis or duodenal ulcer.  Not taking any NSAIDs.  Plan 1.  Stop famotidine trial of Prilosec 40 mg once a day for 4 to 6 weeks.      Dr [name]  MD,MRCP [facility]) Follow up in as needed.

## 2022-11-30 DIAGNOSIS — E119 Type 2 diabetes mellitus without complications: Secondary | ICD-10-CM | POA: Diagnosis not present

## 2022-11-30 DIAGNOSIS — E039 Hypothyroidism, unspecified: Secondary | ICD-10-CM | POA: Diagnosis not present

## 2022-12-05 DIAGNOSIS — J301 Allergic rhinitis due to pollen: Secondary | ICD-10-CM | POA: Diagnosis not present

## 2022-12-05 DIAGNOSIS — R220 Localized swelling, mass and lump, head: Secondary | ICD-10-CM | POA: Diagnosis not present

## 2022-12-07 ENCOUNTER — Other Ambulatory Visit: Payer: Self-pay | Admitting: Family Medicine

## 2022-12-09 DIAGNOSIS — E039 Hypothyroidism, unspecified: Secondary | ICD-10-CM | POA: Diagnosis not present

## 2022-12-09 DIAGNOSIS — E119 Type 2 diabetes mellitus without complications: Secondary | ICD-10-CM | POA: Diagnosis not present

## 2022-12-10 ENCOUNTER — Ambulatory Visit (INDEPENDENT_AMBULATORY_CARE_PROVIDER_SITE_OTHER): Payer: 59 | Admitting: Family Medicine

## 2022-12-10 ENCOUNTER — Encounter: Payer: Self-pay | Admitting: Family Medicine

## 2022-12-10 VITALS — BP 126/80 | HR 93 | Temp 98.3°F | Ht 66.0 in | Wt 209.4 lb

## 2022-12-10 DIAGNOSIS — I10 Essential (primary) hypertension: Secondary | ICD-10-CM

## 2022-12-10 DIAGNOSIS — E785 Hyperlipidemia, unspecified: Secondary | ICD-10-CM | POA: Diagnosis not present

## 2022-12-10 DIAGNOSIS — N289 Disorder of kidney and ureter, unspecified: Secondary | ICD-10-CM | POA: Diagnosis not present

## 2022-12-10 DIAGNOSIS — E669 Obesity, unspecified: Secondary | ICD-10-CM | POA: Diagnosis not present

## 2022-12-10 DIAGNOSIS — F32A Depression, unspecified: Secondary | ICD-10-CM | POA: Diagnosis not present

## 2022-12-10 DIAGNOSIS — E119 Type 2 diabetes mellitus without complications: Secondary | ICD-10-CM

## 2022-12-10 DIAGNOSIS — F419 Anxiety disorder, unspecified: Secondary | ICD-10-CM

## 2022-12-10 LAB — GLUCOSE, POCT (MANUAL RESULT ENTRY): POC Glucose: 101 mg/dl — AB (ref 70–99)

## 2022-12-10 LAB — POCT GLYCOSYLATED HEMOGLOBIN (HGB A1C)
HbA1c POC (<> result, manual entry): 6.2 % (ref 4.0–5.6)
Hemoglobin A1C: 6 % — AB (ref 4.0–5.6)

## 2022-12-10 MED ORDER — DICYCLOMINE HCL 20 MG PO TABS: ORAL_TABLET | ORAL | 11 refills | Status: DC

## 2022-12-10 MED ORDER — ROSUVASTATIN CALCIUM 40 MG PO TABS: 40.0000 mg | ORAL_TABLET | Freq: Every day | ORAL | 1 refills | Status: DC

## 2022-12-10 MED ORDER — ISOSORBIDE MONONITRATE ER 30 MG PO TB24: ORAL_TABLET | ORAL | 2 refills | Status: DC

## 2022-12-10 MED ORDER — OZEMPIC (0.25 OR 0.5 MG/DOSE) 2 MG/3ML ~~LOC~~ SOPN: 0.5000 mg | PEN_INJECTOR | SUBCUTANEOUS | 2 refills | Status: DC

## 2022-12-10 NOTE — Assessment & Plan Note (Signed)
Chronic issue.  I encouraged her to continue to work on seeing psychiatry.  She will continue Lexapro 20 mg daily and Zyprexa 10 mg daily.  Discussed my preference would be to have her see psychiatry for the Zyprexa.

## 2022-12-10 NOTE — Patient Instructions (Addendum)
Nice to see you. Please continue to work on getting into see a psychiatrist. We are going to reduce your Ozempic to 0.5 mg weekly.  I think this will help with your appetite. Please make sure you go and eat something as soon as you leave the office.  If you do not feel better after eating please let me know. Will get lab work to evaluate for other underlying causes of your current symptoms. If you pass out go to the emergency room.

## 2022-12-10 NOTE — Assessment & Plan Note (Addendum)
Chronic issue.  Well-controlled today.  We will reduce her Ozempic back to 0.5 mg weekly given her significantly reduced appetite on Ozempic 1 mg.  I suspect her loopy sensation is related to not having eaten anything today.  Her sugar was not low.  She notes no cardiac symptoms.  She was given crackers in the office and advised to eat something as soon as she left. She noted feeling a little better after eating the crackers.  Will also check a CBC and BMP to look for other underlying causes.  She was advised to seek medical attention immediately if she passed out.

## 2022-12-10 NOTE — Progress Notes (Signed)
[name], MD Phone: [phone]  Valerie Vazquez is a 67 y.o. female who presents today for follow-up.  Diabetes: Patient notes her sugars have been less than 140.  Was 96 this morning.  She has not eaten anything today and notes she feels a little loopy as if she could pass out.  She notes no chest pain, shortness of breath, or palpitations.  She is on [place] and Ozempic.  She notes some polydipsia.  No hypoglycemia.  Obesity: Patient has not been eating as much since she went on the Ozempic.  She is eating regular food.  She is walking some for exercise.  Patient continues to work on seeing a psychiatrist for her Zyprexa.  Social History   Tobacco Use  Smoking Status Former   Packs/day: 1.50   Years: 10.00   Additional pack years: 0.00   Total pack years: 15.00   Types: Cigarettes   Quit date: 04/12/1979   Years since quitting: 43.6  Smokeless Tobacco Never    Current Outpatient Medications on File Prior to Visit  Medication Sig Dispense Refill   acetaminophen (TYLENOL) 500 MG tablet Take 1 tablet (500 mg total) by mouth every 6 (six) hours as needed. 30 tablet 0   b complex vitamins capsule Take 1 capsule by mouth daily.     baclofen (LIORESAL) 10 MG tablet TAKE ONE TABLET BY MOUTH THREE TIMES DAILY AS NEEDED FOR MUSCLE SPASMS (VIAL) 60 tablet 11   blood glucose meter kit and supplies KIT Dispense based on patient and insurance preference. Use once daily fasting. Dx code E11.9. 1 each 0   Blood Glucose Monitoring Suppl (GHT BLOOD GLUCOSE MONITOR) w/Device KIT DISPENSE BASED ON PATIENT AND INSURANCE PREFERENCE. USE ONCE DAILY AS DIRECTED. DX CODE E11.9. 1 kit 0   diphenhydrAMINE (BENADRYL) 25 MG tablet Take 1 tablet (25 mg total) by mouth every 6 (six) hours. 12 tablet 0   ELIQUIS 5 MG TABS tablet TAKE ONE TABLET BY MOUTH TWICE DAILY @9AM  & 5PM 60 tablet 11   escitalopram (LEXAPRO) 20 MG tablet TAKE ONE TABLET BY MOUTH DAILY AT 9AM 90 tablet 0   famotidine (PEPCID)  20 MG tablet TAKE ONE TABLET BY MOUTH DAILY AT 9PM AT BEDTIME 90 tablet 11   fluticasone (FLONASE) 50 MCG/ACT nasal spray Place 2 sprays into both nostrils daily. 16 g 6   furosemide (LASIX) 20 MG tablet TAKE ONE TABLET BY MOUTH DAILY AS NEEDED for edema. (VIAL) 90 tablet 11   gabapentin (NEURONTIN) 600 MG tablet TAKE ONE TABLET BY MOUTH DAILY AT 9AM and TAKE 1 AND 1/2 TABLETS DAILY AT 9PM AT BEDTIME 225 tablet 11   JARDIANCE 25 MG TABS tablet TAKE ONE TABLET BY MOUTH DAILY AT 9AM 90 tablet 1   levothyroxine (SYNTHROID) 25 MCG tablet TAKE ONE TABLET BY MOUTH DAILY AT 7AM BEFORE BREAKFAST AND 2-3 HOURS BEFORE TAKING ANY OTHER MEDICTION OR SUPPLEMENT 90 tablet 3   nitroGLYCERIN (NITROSTAT) 0.4 MG SL tablet Place 1 tablet (0.4 mg total) under the tongue every 5 (five) minutes as needed for chest pain. 25 tablet 3   OLANZapine (ZYPREXA) 10 MG tablet TAKE 1 TABLET BY MOUTH EVERYDAY AT BEDTIME 90 tablet 0   omeprazole (PRILOSEC) 40 MG capsule Take 1 capsule (40 mg total) by mouth daily. 30 capsule 3   ondansetron (ZOFRAN-ODT) 4 MG disintegrating tablet Take 1 tablet (4 mg total) by mouth every 6 (six) hours as needed for nausea. 20 tablet 0   OVER THE COUNTER  MEDICATION 1,500 mg daily. Yellowdock Root     OVER THE COUNTER MEDICATION 200 mg daily. Magnesium Citrate Gummy     OVER THE COUNTER MEDICATION Power C 2 gummies daily     oxyCODONE-acetaminophen (PERCOCET/ROXICET) 5-325 MG tablet Take 1 tablet by mouth every 6 (six) hours as needed (breakthrough pain). 15 tablet 0   pantoprazole (PROTONIX) 40 MG tablet TAKE ONE TABLET BY MOUTH DAILY AT 9AM 72 tablet 11   potassium chloride SA (KLOR-CON M) 20 MEQ tablet TAKE ONE TABLET BY MOUTH DAILY AS NEEDED TAKE WITH LASIX (VIAL) 90 tablet 11   Vitamin A 2400 MCG (8000 UT) TABS Take by mouth daily.     vitamin E 180 MG (400 UNITS) capsule Take 400 Units by mouth daily.     Zinc Sulfate (ZINC 15 PO) Take by mouth daily.     No current facility-administered  medications on file prior to visit.     ROS see history of present illness  Objective  Physical Exam Vitals:   12/10/22 1358  BP: 126/80  Pulse: 93  Temp: 98.3 F (36.8 C)  SpO2: 93%    BP Readings from Last 3 Encounters:  12/10/22 126/80  11/24/22 114/72  11/03/22 120/74   Wt Readings from Last 3 Encounters:  12/10/22 209 lb 6.4 oz (95 kg)  11/24/22 213 lb 8 oz (96.8 kg)  11/03/22 217 lb 3.2 oz (98.5 kg)    Physical Exam Constitutional:      General: She is not in acute distress.    Appearance: She is not diaphoretic.  Cardiovascular:     Rate and Rhythm: Normal rate and regular rhythm.     Heart sounds: Normal heart sounds.  Pulmonary:     Effort: Pulmonary effort is normal.     Breath sounds: Normal breath sounds.  Skin:    General: Skin is warm and dry.  Neurological:     Mental Status: She is alert.      Assessment/Plan: Please see individual problem list.  Diabetes mellitus without complication Assessment & Plan: Chronic issue.  Well-controlled today.  We will reduce her Ozempic back to 0.5 mg weekly given her significantly reduced appetite on Ozempic 1 mg.  I suspect her loopy sensation is related to not having eaten anything today.  Her sugar was not low.  She notes no cardiac symptoms.  She was given crackers in the office and advised to eat something as soon as she left. She noted feeling a little better after eating the crackers.  Will also check a CBC and BMP to look for other underlying causes.  She was advised to seek medical attention immediately if she passed out.  Orders: -     POCT glycosylated hemoglobin (Hb A1C) -     POCT glucose (manual entry) -     Basic metabolic panel -     Ozempic (1.610.25 or 0.5 MG/DOSE); Inject 0.5 mg into the skin once a week.  Dispense: 3 mL; Refill: 2  Essential hypertension -     Isosorbide Mononitrate ER; TAKE ONE TABLET BY MOUTH DAILY AT 9AM  Dispense: 90 tablet; Refill: 2 -     CBC  Hyperlipidemia,  unspecified hyperlipidemia type -     Rosuvastatin Calcium; Take 1 tablet (40 mg total) by mouth daily.  Dispense: 90 tablet; Refill: 1  Obesity (BMI 30.0-34.9) Assessment & Plan: Chronic issue.  She will continue to stay active and try to eat a healthy diet.   Anxiety and depression  Assessment & Plan: Chronic issue.  I encouraged her to continue to work on seeing psychiatry.  She will continue Lexapro 20 mg daily and Zyprexa 10 mg daily.  Discussed my preference would be to have her see psychiatry for the Zyprexa.   Other orders -     Dicyclomine HCl; TAKE ONE TABLET BY MOUTH THREE TIMES DAILY AS NEEDED FOR ABDOMINAL SPASMS (VIAL)  Dispense: 270 tablet; Refill: 11  The patient asked about checking for something called PFS though I was not able to find out what this was when looking at.  She will send me a message giving me more details on what this is.   Return in about 3 months (around 03/11/2023).   [name], MD [facility]

## 2022-12-10 NOTE — Assessment & Plan Note (Signed)
Chronic issue.  She will continue to stay active and try to eat a healthy diet.

## 2022-12-11 ENCOUNTER — Telehealth: Payer: Self-pay | Admitting: Family Medicine

## 2022-12-11 DIAGNOSIS — F251 Schizoaffective disorder, depressive type: Secondary | ICD-10-CM

## 2022-12-11 DIAGNOSIS — F32A Depression, unspecified: Secondary | ICD-10-CM

## 2022-12-11 LAB — BASIC METABOLIC PANEL
BUN: 6 mg/dL (ref 6–23)
CO2: 27 mEq/L (ref 19–32)
Calcium: 9.5 mg/dL (ref 8.4–10.5)
Chloride: 106 mEq/L (ref 96–112)
Creatinine, Ser: 1.19 mg/dL (ref 0.40–1.20)
GFR: 46.92 mL/min — ABNORMAL LOW (ref >60.00)
Glucose, Bld: 94 mg/dL (ref 70–99)
Potassium: 4.2 mEq/L (ref 3.5–5.1)
Sodium: 142 mEq/L (ref 135–145)

## 2022-12-11 LAB — CBC
HCT: 40.5 % (ref 36.0–46.0)
Hemoglobin: 13.7 g/dL (ref 12.0–15.0)
MCHC: 33.8 g/dL (ref 30.0–36.0)
MCV: 96.1 fl (ref 78.0–100.0)
Platelets: 315 10*3/uL (ref 150.0–400.0)
RBC: 4.21 Mil/uL (ref 3.87–5.11)
RDW: 14.2 % (ref 11.5–15.5)
WBC: 8.2 10*3/uL (ref 4.0–10.5)

## 2022-12-11 NOTE — Telephone Encounter (Signed)
For Dr Birdie Sons to review

## 2022-12-11 NOTE — Telephone Encounter (Signed)
Pt called in staying that she's concern about the lab results from yesterday that she got through MyChart. As per pt, her GFR its low and she would like to go over results with provider, she wants to know what she can do?

## 2022-12-11 NOTE — Telephone Encounter (Signed)
I spoke to Patient after this

## 2022-12-11 NOTE — Telephone Encounter (Signed)
Valerie Vazquez from [facility] called stating pt would like a referral to [facility] for [facility] and prescriptions Fax-[phone]

## 2022-12-12 NOTE — Telephone Encounter (Signed)
Referral placed.

## 2022-12-12 NOTE — Telephone Encounter (Signed)
Called Patient to let her know Dr. Birdie Sons placed the referral

## 2022-12-14 ENCOUNTER — Other Ambulatory Visit: Payer: Self-pay | Admitting: Family Medicine

## 2022-12-14 DIAGNOSIS — I10 Essential (primary) hypertension: Secondary | ICD-10-CM

## 2022-12-16 ENCOUNTER — Telehealth: Payer: Self-pay

## 2022-12-16 NOTE — Telephone Encounter (Signed)
Pt called in to check status of referral to [facility].

## 2022-12-16 NOTE — Telephone Encounter (Signed)
Tobi Bastos, Care Navigator from [employer] is calling to see if we received the referral for behavioral health services from Dr. [name].  Tobi Bastos states this is a high priority and she would like for [place] to send the referral by 12/18/2022 if possible.

## 2022-12-16 NOTE — Addendum Note (Signed)
Addended by: Dennie Bible on: 12/16/2022 09:04 AM   Modules accepted: Orders

## 2022-12-18 NOTE — Telephone Encounter (Signed)
F/u    Valerie Vazquez - [facility] Navigator from [facility]  asking for a call back   Phone  [phone] ext [phone].

## 2022-12-24 ENCOUNTER — Ambulatory Visit: Payer: Self-pay

## 2022-12-24 NOTE — Patient Outreach (Signed)
  Care Coordination   Follow Up Visit Note   12/24/2022 Name: Valerie Vazquez MRN: [id number] DOB: Dec 23, 1955  Valerie Vazquez is a 67 y.o. year old female who sees Birdie Sons, Yehuda Mao, MD for primary care. I spoke with  Valerie Vazquez by phone today.  What matters to the patients health and wellness today?  Patient reports blood sugars ranging from 75-106.  She reports having hypoglycemic symptoms when blood sugars in 70's.  She reports current weight is 206 lbs.  Patient states she doesn't have much of an appetite since taking Ozempic. She reports her primary provider wants her to decrease her Ozempic back to 0.5 mg per week.  Patient reports her GFR lab is low.  She states her urine is foamy.  She states she is drinking more alkaline water.  Patient reports she is scheduled for repeat labs on 12/30/22.     Goals Addressed             This Visit's Progress    Patient stated:   diabetic education and assistance with management       Interventions Today    Flowsheet Row Most Recent Value  Chronic Disease   Chronic disease during today's visit Diabetes, Other  [low GFR]  General Interventions   General Interventions Discussed/Reviewed General Interventions Reviewed, Doctor Visits, Labs  [Evaluation of current treatment plan for diabetes and low GFR and patients adherence to plan as estabished by provider.  Assessed for blood sugar readings. Assessed current weight.]  Labs Kidney Function  [Reviewed recent BMP results]  Doctor Visits Discussed/Reviewed Doctor Visits Reviewed  [reviewed recent primary care provider note. Advised to notifiy provider of frequent low blood sugar readings.]  Exercise Interventions   Exercise Discussed/Reviewed Physical Activity  Physical Activity Discussed/Reviewed Physical Activity Reviewed  [Encouraged patient to incorporate physical activity in daily routine]  Education Interventions   Education Provided Provided Education  Provided Verbal Education On  Blood Sugar Monitoring, Other  [Advised patient to monitor blood sugars daily and record. Reviewed Rule of 15 for hypoglycemic treatment. Encouraged to remain hydrated.]  Pharmacy Interventions   Pharmacy Dicussed/Reviewed Pharmacy Topics Reviewed  [medications reviewed and compliance discussed.]                  SDOH assessments and interventions completed:  No     Care Coordination Interventions:  Yes, provided   Follow up plan: Follow up call scheduled for 01/26/23    Encounter Outcome:  Pt. Visit Completed   George Ina RN,BSN,CCM [facility] Care Coordination [phone] direct line

## 2022-12-25 ENCOUNTER — Other Ambulatory Visit: Payer: Self-pay | Admitting: Family Medicine

## 2022-12-25 DIAGNOSIS — R1013 Epigastric pain: Secondary | ICD-10-CM

## 2022-12-30 ENCOUNTER — Other Ambulatory Visit (INDEPENDENT_AMBULATORY_CARE_PROVIDER_SITE_OTHER): Payer: 59

## 2022-12-30 DIAGNOSIS — E119 Type 2 diabetes mellitus without complications: Secondary | ICD-10-CM | POA: Diagnosis not present

## 2022-12-30 DIAGNOSIS — E039 Hypothyroidism, unspecified: Secondary | ICD-10-CM | POA: Diagnosis not present

## 2022-12-30 DIAGNOSIS — N289 Disorder of kidney and ureter, unspecified: Secondary | ICD-10-CM

## 2022-12-31 LAB — BASIC METABOLIC PANEL
BUN: 9 mg/dL (ref 6–23)
CO2: 25 mEq/L (ref 19–32)
Calcium: 9.5 mg/dL (ref 8.4–10.5)
Chloride: 105 mEq/L (ref 96–112)
Creatinine, Ser: 1.03 mg/dL (ref 0.40–1.20)
GFR: 55.78 mL/min — ABNORMAL LOW (ref >60.00)
Glucose, Bld: 113 mg/dL — ABNORMAL HIGH (ref 70–99)
Potassium: 4.3 mEq/L (ref 3.5–5.1)
Sodium: 140 mEq/L (ref 135–145)

## 2023-01-08 DIAGNOSIS — E039 Hypothyroidism, unspecified: Secondary | ICD-10-CM | POA: Diagnosis not present

## 2023-01-08 DIAGNOSIS — E119 Type 2 diabetes mellitus without complications: Secondary | ICD-10-CM | POA: Diagnosis not present

## 2023-01-09 ENCOUNTER — Other Ambulatory Visit: Payer: Self-pay | Admitting: Family Medicine

## 2023-01-09 ENCOUNTER — Telehealth: Payer: Self-pay | Admitting: Family Medicine

## 2023-01-09 DIAGNOSIS — I10 Essential (primary) hypertension: Secondary | ICD-10-CM

## 2023-01-09 DIAGNOSIS — K582 Mixed irritable bowel syndrome: Secondary | ICD-10-CM

## 2023-01-09 DIAGNOSIS — E785 Hyperlipidemia, unspecified: Secondary | ICD-10-CM

## 2023-01-09 NOTE — Telephone Encounter (Signed)
error 

## 2023-01-12 NOTE — Telephone Encounter (Signed)
Please check with the patient to see if she is taking Amitiza.  Thanks.

## 2023-01-12 NOTE — Telephone Encounter (Signed)
Amitiza 8 Mcg discontinued 9/23 denied twice on refill. Advise to refill?

## 2023-01-13 ENCOUNTER — Telehealth: Payer: Self-pay | Admitting: Family Medicine

## 2023-01-13 NOTE — Telephone Encounter (Signed)
"  Great morning!   I received a fax from [employer] stating that we only accept patients between the ages of 80yrs-65 yrs.please advise and Thank you!"   Message received from referral coordinator. Please relay this to the patient. She needs to check with her insurance to see what psychiatrists in this area are covered and let us know so I can place another referral. Thanks.

## 2023-01-15 ENCOUNTER — Telehealth: Payer: Self-pay | Admitting: Gastroenterology

## 2023-01-15 NOTE — Telephone Encounter (Signed)
Pt left vmm having feeling for the last couple of days lump in throat now today she is having trouble swallowing please return call

## 2023-01-20 ENCOUNTER — Ambulatory Visit (INDEPENDENT_AMBULATORY_CARE_PROVIDER_SITE_OTHER): Payer: 59 | Admitting: Gastroenterology

## 2023-01-20 ENCOUNTER — Ambulatory Visit: Payer: 59 | Admitting: Gastroenterology

## 2023-01-20 ENCOUNTER — Encounter: Payer: Self-pay | Admitting: Gastroenterology

## 2023-01-20 ENCOUNTER — Other Ambulatory Visit: Payer: Self-pay

## 2023-01-20 VITALS — BP 101/66 | HR 86 | Temp 98.3°F | Ht 66.0 in | Wt 205.0 lb

## 2023-01-20 DIAGNOSIS — R1319 Other dysphagia: Secondary | ICD-10-CM | POA: Diagnosis not present

## 2023-01-20 MED ORDER — OMEPRAZOLE 40 MG PO CPDR: 40.0000 mg | DELAYED_RELEASE_CAPSULE | Freq: Two times a day (BID) | ORAL | 3 refills | Status: DC

## 2023-01-20 NOTE — Patient Instructions (Addendum)

## 2023-01-20 NOTE — Progress Notes (Signed)
Wyline Mood MD, MRCP(U.K) [address]  [address]  [place], [place] [address]  Main: [phone]  Fax: [phone]   Primary Care Physician: Glori Luis, MD  Primary Gastroenterologist:  Dr. Wyline Mood   Chief Complaint  Patient presents with   Abdominal Pain    HPI: Valerie Vazquez is a 67 y.o. female  Summary of history : Previously been seen for constipation -IBS-, GERD, elavated GGT in 11/2017(felt secondary to excess tyelnol use)  and dysphagia. Constipation resolved with Rx and dysphagia resolved with PPI  Colonoscopy 02/17/17 - 6 mm serrated adenoma resected. CT abdomen in 09/2016 showed no abnormalities of the liver .  02/16/2019: H pylori stool antigen negative 03/07/2019: EGD: gastric bx - gastritis  S/p cholecystectomy in 2002.  Previously had constipation treated with Linzess 145 mcg   09/19/2020 : Underwent a CT scan for hematuria work up and was found to incidentally have Mild wall thickening with adjacent inflammation involving the sigmoid colon, which may represent a mild infectious or inflammatory colitis.     Interval history 11/22/2020-11/24/2022  01/15/2023: Called our office with difficulties with swallowing and wanted to be seen.  She states that for a week she had a lot of regurgitation and then following that she had difficulty swallowing which has since got significantly better she takes Prilosec once every day at night before going to bed   Current Outpatient Medications  Medication Sig Dispense Refill   acetaminophen (TYLENOL) 500 MG tablet Take 1 tablet (500 mg total) by mouth every 6 (six) hours as needed. 30 tablet 0   b complex vitamins capsule Take 1 capsule by mouth daily.     baclofen (LIORESAL) 10 MG tablet TAKE ONE TABLET BY MOUTH THREE TIMES DAILY AS NEEDED FOR MUSCLE SPASMS (VIAL) 60 tablet 11   blood glucose meter kit and supplies KIT Dispense based on patient and insurance preference. Use once daily fasting. Dx code E11.9. 1  each 0   Blood Glucose Monitoring Suppl (GHT BLOOD GLUCOSE MONITOR) w/Device KIT DISPENSE BASED ON PATIENT AND INSURANCE PREFERENCE. USE ONCE DAILY AS DIRECTED. DX CODE E11.9. 1 kit 0   dicyclomine (BENTYL) 20 MG tablet TAKE ONE TABLET BY MOUTH THREE TIMES DAILY AS NEEDED FOR ABDOMINAL SPASMS (VIAL) 270 tablet 3   diphenhydrAMINE (BENADRYL) 25 MG tablet Take 1 tablet (25 mg total) by mouth every 6 (six) hours. 12 tablet 0   ELIQUIS 5 MG TABS tablet TAKE ONE TABLET BY MOUTH TWICE DAILY @ 9AM-5PM (VIAL) 60 tablet 11   escitalopram (LEXAPRO) 20 MG tablet TAKE ONE TABLET BY MOUTH DAILY AT 9AM 90 tablet 0   famotidine (PEPCID) 20 MG tablet TAKE ONE TABLET BY MOUTH DAILY AT 9PM AT BEDTIME (VIAL) 90 tablet 11   fluticasone (FLONASE) 50 MCG/ACT nasal spray Place 2 sprays into both nostrils daily. 16 g 6   furosemide (LASIX) 20 MG tablet TAKE ONE TABLET BY MOUTH DAILY AS NEEDED FOR EDEMA (VIAL) 90 tablet 3   gabapentin (NEURONTIN) 600 MG tablet TAKE ONE TABLET BY MOUTH DAILY AT 9AM and TAKE 1 AND 1/2 TABLETS DAILY AT 9PM AT BEDTIME 225 tablet 11   isosorbide mononitrate (IMDUR) 30 MG 24 hr tablet TAKE ONE TABLET BY MOUTH DAILY AT 9AM (VIAL) 90 tablet 3   JARDIANCE 25 MG TABS tablet TAKE ONE TABLET BY MOUTH DAILY AT 9AM (VIAL) 90 tablet 3   levothyroxine (SYNTHROID) 25 MCG tablet TAKE ONE TABLET BY MOUTH DAILY AT 7AM BEFORE BREAKFAST  AND 2-3 HOURS BEFORE TAKING ANY OTHER MEDICTION OR SUPPLEMENT 90 tablet 3   nitroGLYCERIN (NITROSTAT) 0.4 MG SL tablet Place 1 tablet (0.4 mg total) under the tongue every 5 (five) minutes as needed for chest pain. 25 tablet 3   OLANZapine (ZYPREXA) 10 MG tablet TAKE 1 TABLET BY MOUTH EVERYDAY AT BEDTIME 90 tablet 0   omeprazole (PRILOSEC) 40 MG capsule Take 1 capsule (40 mg total) by mouth daily. 30 capsule 3   ondansetron (ZOFRAN-ODT) 4 MG disintegrating tablet Take 1 tablet (4 mg total) by mouth every 6 (six) hours as needed for nausea. 20 tablet 0   OVER THE COUNTER  MEDICATION 1,500 mg daily. Yellowdock Root     OVER THE COUNTER MEDICATION 200 mg daily. Magnesium Citrate Gummy     OVER THE COUNTER MEDICATION Power C 2 gummies daily     oxyCODONE-acetaminophen (PERCOCET/ROXICET) 5-325 MG tablet Take 1 tablet by mouth every 6 (six) hours as needed (breakthrough pain). 15 tablet 0   pantoprazole (PROTONIX) 40 MG tablet TAKE ONE TABLET BY MOUTH DAILY AT 9AM 72 tablet 11   potassium chloride SA (KLOR-CON M) 20 MEQ tablet TAKE ONE TABLET BY MOUTH DAILY AS NEEDED TAKE WITH LASIX (VIAL) 90 tablet 11   rosuvastatin (CRESTOR) 40 MG tablet TAKE ONE TABLET BY MOUTH DAILY AT 5PM 90 tablet 3   Semaglutide,0.25 or 0.5MG /DOS, (OZEMPIC, 0.25 OR 0.5 MG/DOSE,) 2 MG/3ML SOPN Inject 0.5 mg into the skin once a week. 3 mL 2   tobramycin-dexamethasone (TOBRADEX) ophthalmic solution Place 2 drops into the left eye every 6 (six) hours.     Vitamin A 2400 MCG (8000 UT) TABS Take by mouth daily.     vitamin E 180 MG (400 UNITS) capsule Take 400 Units by mouth daily.     Zinc Sulfate (ZINC 15 PO) Take by mouth daily.     No current facility-administered medications for this visit.    Allergies as of 01/20/2023 - Review Complete 01/20/2023  Allergen Reaction Noted   Abilify [aripiprazole] Other (See Comments) 09/20/2013   Augmentin [amoxicillin-pot clavulanate] Diarrhea and Nausea And Vomiting 09/20/2013   Bactrim [sulfamethoxazole-trimethoprim] Diarrhea 09/20/2013   Ceftin [cefuroxime axetil] Diarrhea 09/20/2013   Coconut (cocos nucifera)  10/19/2015   Diclofenac Other (See Comments) 08/01/2014   Dye fdc red [red dye]  01/22/2016   Indocin [indomethacin] Other (See Comments) 09/20/2013   Linaclotide Diarrhea 12/09/2014   Lisinopril Diarrhea and Other (See Comments) 09/20/2013   Morphine Other (See Comments) 12/09/2014   Morphine and codeine Other (See Comments) 09/20/2013   Simvastatin Other (See Comments) 08/01/2014   Sulfa antibiotics Diarrhea 12/09/2014    Sulfamethoxazole-trimethoprim Diarrhea 12/09/2014   Wellbutrin [bupropion] Other (See Comments) 09/20/2013   Zetia [ezetimibe]  09/18/2017   Quetiapine fumarate Palpitations 08/01/2014   Quetiapine fumarate Palpitations 08/16/2014    ROS:  General: Negative for anorexia, weight loss, fever, chills, fatigue, weakness. ENT: Negative for hoarseness, difficulty swallowing , nasal congestion. CV: Negative for chest pain, angina, palpitations, dyspnea on exertion, peripheral edema.  Respiratory: Negative for dyspnea at rest, dyspnea on exertion, cough, sputum, wheezing.  GI: See history of present illness. GU:  Negative for dysuria, hematuria, urinary incontinence, urinary frequency, nocturnal urination.  Endo: Negative for unusual weight change.    Physical Examination:   BP 101/66   Pulse 86   Temp 98.3 F (36.8 C) (Oral)   Ht 5\' 6"  (1.676 m)   Wt 205 lb (93 kg)   BMI 33.09 kg/m   General:  Well-nourished, well-developed in no acute distress.  Eyes: No icterus. Conjunctivae pink. Neuro: Alert and oriented x 3.  Grossly intact. Skin: Warm and dry, no jaundice.   Psych: Alert and cooperative, normal mood and affect.   Imaging Studies: No results found.  Assessment and Plan:   Valerie Vazquez is a 67 y.o. y/o female has a history of IBS constipation.  Today here to see me for dysphagia.  She has had dysphagia in the past with that has resolved with PPI use.  It appears a week back she had a significant episode of reflux which she describes as regurgitation which may have caused esophagitis and subsequently has resolved.  She has very minimal dysphagia at this point of time and I will place her on Prilosec 40 mg twice daily for 8 weeks and we will see her after that if she is having dysphagia then will require endoscopy if she has no dysphagia we will gradually decrease the dose of her PPI.     Dr Wyline Mood  MD,MRCP [facility]) Follow up in 8 weeks with PA

## 2023-01-20 NOTE — Telephone Encounter (Signed)
Patient came in today to see Dr. Tobi Bastos. Please see today's office visit.

## 2023-01-27 ENCOUNTER — Ambulatory Visit: Payer: Self-pay

## 2023-01-27 NOTE — Patient Outreach (Signed)
  Care Coordination   Follow Up Visit Note   01/27/2023 Name: Valerie Vazquez MRN: [id number] DOB: 12-17-1955  Valerie Vazquez is a 67 y.o. year old female who sees Birdie Sons, Yehuda Mao, MD for primary care. I spoke with  Valerie Vazquez by phone today.  What matters to the patients health and wellness today?  Patient states her blood sugars range from 102-158.  Patient states her doctor decreased her Ozempic back to 0.5 mg daily.  She states her omeprazole was increased to 2 x per day.     Goals Addressed             This Visit's Progress    Patient stated:   diabetic education and assistance with management       Interventions Today    Flowsheet Row Most Recent Value  Chronic Disease   Chronic disease during today's visit Diabetes  General Interventions   General Interventions Discussed/Reviewed General Interventions Reviewed, Doctor Visits  [evaluation of current treatment plan for diabetes and patients adherence to plan as established by provider. Assessed for blood sugar range.]  Doctor Visits Discussed/Reviewed Doctor Visits Reviewed  Annabell Sabal upcoming provider visits]  Education Interventions   Education Provided Provided Education  Provided Verbal Education On Blood Sugar Monitoring  Nutrition Interventions   Nutrition Discussed/Reviewed Nutrition Reviewed  Pharmacy Interventions   Pharmacy Dicussed/Reviewed Pharmacy Topics Reviewed  [medications reviewed and compliance discussed]                  SDOH assessments and interventions completed:  No     Care Coordination Interventions:  Yes, provided   Follow up plan: Follow up call scheduled for 03/17/23    Encounter Outcome:  Pt. Visit Completed   George Ina RN,BSN,CCM [facility] Care Coordination [phone] direct line

## 2023-01-30 DIAGNOSIS — E119 Type 2 diabetes mellitus without complications: Secondary | ICD-10-CM | POA: Diagnosis not present

## 2023-01-30 DIAGNOSIS — E039 Hypothyroidism, unspecified: Secondary | ICD-10-CM | POA: Diagnosis not present

## 2023-02-07 DIAGNOSIS — E039 Hypothyroidism, unspecified: Secondary | ICD-10-CM | POA: Diagnosis not present

## 2023-02-07 DIAGNOSIS — E119 Type 2 diabetes mellitus without complications: Secondary | ICD-10-CM | POA: Diagnosis not present

## 2023-02-09 ENCOUNTER — Ambulatory Visit (INDEPENDENT_AMBULATORY_CARE_PROVIDER_SITE_OTHER): Payer: 59

## 2023-02-09 VITALS — Ht 66.0 in | Wt 205.0 lb

## 2023-02-09 DIAGNOSIS — Z0000 Encounter for general adult medical examination without abnormal findings: Secondary | ICD-10-CM | POA: Diagnosis not present

## 2023-02-09 NOTE — Patient Instructions (Signed)
Valerie Vazquez , Thank you for taking time to come for your Medicare Wellness Visit. I appreciate your ongoing commitment to your health goals. Please review the following plan we discussed and let me know if I can assist you in the future.   These are the goals we discussed:  Goals       " I need an aid following my surgery"      Care Coordination Interventions: Patient discussed plan to have surgery on 05/15/22, [facility] will provide transport there and a friend of hers will provide the transportation home Patient requesting assistance with a in home aid to assist with her care needs post surgery Patient confirmed that she does have UHC/Medicare but does not have full Medicaid which would cover personal care assistance Patient agreeable to contacting her insurance company to discuss any available benefits related to in home care as well as the inpatient care management team Considering family and friends for assistance also discussed-patient agreeable to requesting assistance from close friends as well This social worker's contact information provided for any additional community resource needs      DIET - EAT MORE FRUITS AND VEGETABLES      Increase water intake      Patient stated:   diabetic education and assistance with management      Interventions Today    Flowsheet Row Most Recent Value  Chronic Disease   Chronic disease during today's visit Diabetes  General Interventions   General Interventions Discussed/Reviewed General Interventions Reviewed, Doctor Visits  [evaluation of current treatment plan for diabetes and patients adherence to plan as established by provider. Assessed for blood sugar range.]  Doctor Visits Discussed/Reviewed Doctor Visits Reviewed  Annabell Sabal upcoming provider visits]  Education Interventions   Education Provided Provided Education  Provided Verbal Education On Blood Sugar Monitoring  Nutrition Interventions   Nutrition Discussed/Reviewed Nutrition Reviewed   Pharmacy Interventions   Pharmacy Dicussed/Reviewed Pharmacy Topics Reviewed  [medications reviewed and compliance discussed]                Weight loss goal <200lb (pt-stated)      Healthy portion control meals        This is a list of the screening recommended for you and due dates:  Health Maintenance  Topic Date Due   DTaP/Tdap/Td vaccine (1 - Tdap) Never done   Eye exam for diabetics  01/17/2023   Complete foot exam   02/01/2023   Flu Shot  04/02/2023   Hemoglobin A1C  06/11/2023   Yearly kidney health urinalysis for diabetes  09/10/2023   Mammogram  10/28/2023   Yearly kidney function blood test for diabetes  12/30/2023   Medicare Annual Wellness Visit  02/09/2024   Colon Cancer Screening  11/04/2024   Pneumonia Vaccine  Completed   DEXA scan (bone density measurement)  Completed   Hepatitis C Screening  Completed   HPV Vaccine  Aged Out   COVID-19 Vaccine  Discontinued   Zoster (Shingles) Vaccine  Discontinued    Advanced directives: no  Conditions/risks identified: none  Next appointment: Follow up in one year for your annual wellness visit 02/10/24 @ 9:45 am by phone   Preventive Care 65 Years and Older, Female Preventive care refers to lifestyle choices and visits with your health care provider that can promote health and wellness. What does preventive care include? A yearly physical exam. This is also called an annual well check. Dental exams once or twice a year. Routine eye exams. Ask your  health care provider how often you should have your eyes checked. Personal lifestyle choices, including: Daily care of your teeth and gums. Regular physical activity. Eating a healthy diet. Avoiding tobacco and drug use. Limiting alcohol use. Practicing safe sex. Taking low-dose aspirin every day. Taking vitamin and mineral supplements as recommended by your health care provider. What happens during an annual well check? The services and screenings done by  your health care provider during your annual well check will depend on your age, overall health, lifestyle risk factors, and family history of disease. Counseling  Your health care provider may ask you questions about your: Alcohol use. Tobacco use. Drug use. Emotional well-being. Home and relationship well-being. Sexual activity. Eating habits. History of falls. Memory and ability to understand (cognition). Work and work Astronomer. Reproductive health. Screening  You may have the following tests or measurements: Height, weight, and BMI. Blood pressure. Lipid and cholesterol levels. These may be checked every 5 years, or more frequently if you are over 16 years old. Skin check. Lung cancer screening. You may have this screening every year starting at age 89 if you have a 30-pack-year history of smoking and currently smoke or have quit within the past 15 years. Fecal occult blood test (FOBT) of the stool. You may have this test every year starting at age 71. Flexible sigmoidoscopy or colonoscopy. You may have a sigmoidoscopy every 5 years or a colonoscopy every 10 years starting at age 74. Hepatitis C blood test. Hepatitis B blood test. Sexually transmitted disease (STD) testing. Diabetes screening. This is done by checking your blood sugar (glucose) after you have not eaten for a while (fasting). You may have this done every 1-3 years. Bone density scan. This is done to screen for osteoporosis. You may have this done starting at age 54. Mammogram. This may be done every 1-2 years. Talk to your health care provider about how often you should have regular mammograms. Talk with your health care provider about your test results, treatment options, and if necessary, the need for more tests. Vaccines  Your health care provider may recommend certain vaccines, such as: Influenza vaccine. This is recommended every year. Tetanus, diphtheria, and acellular pertussis (Tdap, Td) vaccine. You  may need a Td booster every 10 years. Zoster vaccine. You may need this after age 75. Pneumococcal 13-valent conjugate (PCV13) vaccine. One dose is recommended after age 21. Pneumococcal polysaccharide (PPSV23) vaccine. One dose is recommended after age 43. Talk to your health care provider about which screenings and vaccines you need and how often you need them. This information is not intended to replace advice given to you by your health care provider. Make sure you discuss any questions you have with your health care provider. Document Released: 09/14/2015 Document Revised: 05/07/2016 Document Reviewed: 06/19/2015 Elsevier Interactive Patient Education  2017 [employer].  Fall Prevention in the Home Falls can cause injuries. They can happen to people of all ages. There are many things you can do to make your home safe and to help prevent falls. What can I do on the outside of my home? Regularly fix the edges of walkways and driveways and fix any cracks. Remove anything that might make you trip as you walk through a door, such as a raised step or threshold. Trim any bushes or trees on the path to your home. Use bright outdoor lighting. Clear any walking paths of anything that might make someone trip, such as rocks or tools. Regularly check to see if  handrails are loose or broken. Make sure that both sides of any steps have handrails. Any raised decks and porches should have guardrails on the edges. Have any leaves, snow, or ice cleared regularly. Use sand or salt on walking paths during winter. Clean up any spills in your garage right away. This includes oil or grease spills. What can I do in the bathroom? Use night lights. Install grab bars by the toilet and in the tub and shower. Do not use towel bars as grab bars. Use non-skid mats or decals in the tub or shower. If you need to sit down in the shower, use a plastic, non-slip stool. Keep the floor dry. Clean up any water that spills  on the floor as soon as it happens. Remove soap buildup in the tub or shower regularly. Attach bath mats securely with double-sided non-slip rug tape. Do not have throw rugs and other things on the floor that can make you trip. What can I do in the bedroom? Use night lights. Make sure that you have a light by your bed that is easy to reach. Do not use any sheets or blankets that are too big for your bed. They should not hang down onto the floor. Have a firm chair that has side arms. You can use this for support while you get dressed. Do not have throw rugs and other things on the floor that can make you trip. What can I do in the kitchen? Clean up any spills right away. Avoid walking on wet floors. Keep items that you use a lot in easy-to-reach places. If you need to reach something above you, use a strong step stool that has a grab bar. Keep electrical cords out of the way. Do not use floor polish or wax that makes floors slippery. If you must use wax, use non-skid floor wax. Do not have throw rugs and other things on the floor that can make you trip. What can I do with my stairs? Do not leave any items on the stairs. Make sure that there are handrails on both sides of the stairs and use them. Fix handrails that are broken or loose. Make sure that handrails are as long as the stairways. Check any carpeting to make sure that it is firmly attached to the stairs. Fix any carpet that is loose or worn. Avoid having throw rugs at the top or bottom of the stairs. If you do have throw rugs, attach them to the floor with carpet tape. Make sure that you have a light switch at the top of the stairs and the bottom of the stairs. If you do not have them, ask someone to add them for you. What else can I do to help prevent falls? Wear shoes that: Do not have high heels. Have rubber bottoms. Are comfortable and fit you well. Are closed at the toe. Do not wear sandals. If you use a stepladder: Make  sure that it is fully opened. Do not climb a closed stepladder. Make sure that both sides of the stepladder are locked into place. Ask someone to hold it for you, if possible. Clearly mark and make sure that you can see: Any grab bars or handrails. First and last steps. Where the edge of each step is. Use tools that help you move around (mobility aids) if they are needed. These include: Canes. Walkers. Scooters. Crutches. Turn on the lights when you go into a dark area. Replace any light bulbs as soon as  they burn out. Set up your furniture so you have a clear path. Avoid moving your furniture around. If any of your floors are uneven, fix them. If there are any pets around you, be aware of where they are. Review your medicines with your doctor. Some medicines can make you feel dizzy. This can increase your chance of falling. Ask your doctor what other things that you can do to help prevent falls. This information is not intended to replace advice given to you by your health care provider. Make sure you discuss any questions you have with your health care provider. Document Released: 06/14/2009 Document Revised: 01/24/2016 Document Reviewed: 09/22/2014 Elsevier Interactive Patient Education  2017 [employer].

## 2023-02-09 NOTE — Progress Notes (Signed)
I connected with  AIRABELLA SCHAADT on 02/09/23 by a audio enabled telemedicine application and verified that I am speaking with the correct person using two identifiers.  Patient Location: Home  Provider Location: Office/Clinic  I discussed the limitations of evaluation and management by telemedicine. The patient expressed understanding and agreed to proceed.  Subjective:   KALYNA MICHELS is a 67 y.o. female who presents for Medicare Annual (Subsequent) preventive examination.  Review of Systems     Cardiac Risk Factors include: advanced age (>79men, >26 women);diabetes mellitus;hypertension;sedentary lifestyle     Objective:    Today's Vitals   02/09/23 1156  Weight: 205 lb (93 kg)  Height: 5\' 6"  (1.676 m)   Body mass index is 33.09 kg/m.     02/09/2023   11:43 AM 05/21/2022    5:18 PM 05/15/2022    7:10 AM 05/07/2022    2:14 PM 02/05/2022    2:10 PM 11/04/2021    8:18 AM 01/30/2021   11:34 AM  Advanced Directives  Does Patient Have a Medical Advance Directive? No No No No No No No  Would patient like information on creating a medical advance directive? No - Patient declined  No - Patient declined  No - Patient declined  No - Patient declined    Current Medications (verified) Outpatient Encounter Medications as of 02/09/2023  Medication Sig   acetaminophen (TYLENOL) 500 MG tablet Take 1 tablet (500 mg total) by mouth every 6 (six) hours as needed.   baclofen (LIORESAL) 10 MG tablet TAKE ONE TABLET BY MOUTH THREE TIMES DAILY AS NEEDED FOR MUSCLE SPASMS (VIAL)   blood glucose meter kit and supplies KIT Dispense based on patient and insurance preference. Use once daily fasting. Dx code E11.9.   Blood Glucose Monitoring Suppl (GHT BLOOD GLUCOSE MONITOR) w/Device KIT DISPENSE BASED ON PATIENT AND INSURANCE PREFERENCE. USE ONCE DAILY AS DIRECTED. DX CODE E11.9.   dicyclomine (BENTYL) 20 MG tablet TAKE ONE TABLET BY MOUTH THREE TIMES DAILY AS NEEDED FOR ABDOMINAL SPASMS (VIAL)    diphenhydrAMINE (BENADRYL) 25 MG tablet Take 1 tablet (25 mg total) by mouth every 6 (six) hours.   ELIQUIS 5 MG TABS tablet TAKE ONE TABLET BY MOUTH TWICE DAILY @ 9AM-5PM (VIAL)   escitalopram (LEXAPRO) 20 MG tablet TAKE ONE TABLET BY MOUTH DAILY AT 9AM   famotidine (PEPCID) 20 MG tablet TAKE ONE TABLET BY MOUTH DAILY AT 9PM AT BEDTIME (VIAL)   furosemide (LASIX) 20 MG tablet TAKE ONE TABLET BY MOUTH DAILY AS NEEDED FOR EDEMA (VIAL)   gabapentin (NEURONTIN) 600 MG tablet TAKE ONE TABLET BY MOUTH DAILY AT 9AM and TAKE 1 AND 1/2 TABLETS DAILY AT 9PM AT BEDTIME   isosorbide mononitrate (IMDUR) 30 MG 24 hr tablet TAKE ONE TABLET BY MOUTH DAILY AT 9AM (VIAL)   JARDIANCE 25 MG TABS tablet TAKE ONE TABLET BY MOUTH DAILY AT 9AM (VIAL)   levothyroxine (SYNTHROID) 25 MCG tablet TAKE ONE TABLET BY MOUTH DAILY AT 7AM BEFORE BREAKFAST AND 2-3 HOURS BEFORE TAKING ANY OTHER MEDICTION OR SUPPLEMENT   nitroGLYCERIN (NITROSTAT) 0.4 MG SL tablet Place 1 tablet (0.4 mg total) under the tongue every 5 (five) minutes as needed for chest pain.   OLANZapine (ZYPREXA) 10 MG tablet TAKE 1 TABLET BY MOUTH EVERYDAY AT BEDTIME   omeprazole (PRILOSEC) 40 MG capsule Take 1 capsule (40 mg total) by mouth in the morning and at bedtime.   ondansetron (ZOFRAN-ODT) 4 MG disintegrating tablet Take 1 tablet (4 mg total)  by mouth every 6 (six) hours as needed for nausea.   OVER THE COUNTER MEDICATION 200 mg daily. Magnesium Citrate Gummy   OVER THE COUNTER MEDICATION Power C 2 gummies daily   pantoprazole (PROTONIX) 40 MG tablet TAKE ONE TABLET BY MOUTH DAILY AT 9AM   potassium chloride SA (KLOR-CON M) 20 MEQ tablet TAKE ONE TABLET BY MOUTH DAILY AS NEEDED TAKE WITH LASIX (VIAL)   rosuvastatin (CRESTOR) 40 MG tablet TAKE ONE TABLET BY MOUTH DAILY AT 5PM   Semaglutide,0.25 or 0.5MG /DOS, (OZEMPIC, 0.25 OR 0.5 MG/DOSE,) 2 MG/3ML SOPN Inject 0.5 mg into the skin once a week.   tobramycin-dexamethasone (TOBRADEX) ophthalmic solution Place  2 drops into the left eye every 6 (six) hours.   vortioxetine HBr (TRINTELLIX) 20 MG TABS tablet Take 20 mg by mouth daily.   Zinc Sulfate (ZINC 15 PO) Take by mouth daily.   b complex vitamins capsule Take 1 capsule by mouth daily. (Patient not taking: Reported on 02/09/2023)   fluticasone (FLONASE) 50 MCG/ACT nasal spray Place 2 sprays into both nostrils daily.   OVER THE COUNTER MEDICATION 1,500 mg daily. Yellowdock Root (Patient not taking: Reported on 02/09/2023)   oxyCODONE-acetaminophen (PERCOCET/ROXICET) 5-325 MG tablet Take 1 tablet by mouth every 6 (six) hours as needed (breakthrough pain). (Patient not taking: Reported on 02/09/2023)   Vitamin A 2400 MCG (8000 UT) TABS Take by mouth daily. (Patient not taking: Reported on 02/09/2023)   vitamin E 180 MG (400 UNITS) capsule Take 400 Units by mouth daily. (Patient not taking: Reported on 02/09/2023)   No facility-administered encounter medications on file as of 02/09/2023.    Allergies (verified) Abilify [aripiprazole], Augmentin [amoxicillin-pot clavulanate], Bactrim [sulfamethoxazole-trimethoprim], Ceftin [cefuroxime axetil], Coconut (cocos nucifera), Diclofenac, Dye fdc red [red dye], Indocin [indomethacin], Linaclotide, Lisinopril, Morphine, Morphine and codeine, Simvastatin, Sulfa antibiotics, Sulfamethoxazole-trimethoprim, Wellbutrin [bupropion], Zetia [ezetimibe], Quetiapine fumarate, and Quetiapine fumarate   History: Past Medical History:  Diagnosis Date   Acute right-sided low back pain with right-sided sciatica 10/23/2017   Anemia    Anginal pain (HCC)    Bilateral ovarian cysts    Bilateral renal cysts    Bulimia nervosa 05/07/2021   CAD (coronary artery disease) 09/28/2016   Chest pain 10/14/2015   Chronic back pain    "upper and lower" (09/19/2014)   Chronic shoulder pain (Location of Primary Source of Pain) (Left) 01/29/2016   Colitis 04/08/2016   Colon polyp 01/30/2017   Coronary vasospasm (HCC) 12/09/2014   DDD  (degenerative disc disease), cervical    a.) s/p ACDF C5-C7   Depression    DVT (deep venous thrombosis) (HCC)    Esophageal spasm    Essential hypertension 10/28/2013   Gallstones    Gastritis    Gastroenteritis    GERD (gastroesophageal reflux disease)    Heart disease 12/09/2016   High blood pressure    High cholesterol    History of blood transfusion 1985   related to hysterectomy   History of left heart catheterization (LHC) 12/09/2014   a.) LHC 12/09/2014: EF 50%; normal coronaries.   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 Rule out Paget's disease of the bone with blood work.    Hyperlipidemia 10/28/2013   Hypertension    Hypothyroidism    IBS (irritable bowel syndrome)  Long term current use of anticoagulant    a.) apixaban   Lumbar central spinal stenosis (moderate at L4-5; mild at L2-3 and L3-4) 01/29/2016   Migraine    "a few times/yr" (09/19/2014)   Myocardial infarction (HCC) 10/2010 X 3   "while hospitalized"   Neck pain 01/29/2016   Osteoarthritis    Pleural effusion, bilateral 07/31/2015   Pneumonia 04/2014   PONV (postoperative nausea and vomiting)    PTSD (post-traumatic stress disorder)    Pulmonary embolism (HCC) 04/2001; 09/19/2014   "after gallbladder OR; "   Schizoaffective disorder, depressive type (HCC) 08/28/2016   Sleep apnea    "mild" (09/19/2014)   Snoring    a. sleep study 5/16: No OSA   SOB (shortness of breath) 07/31/2015   Spinal stenosis    Spondylosis    Suicidal ideation 08/27/2016   a.) seen in ED with approx 6 months of reported SI. Reported ingestion of "unknown pills" to end life; last ingestion was 3 days PTA; UDS (+) for TCAs. SI related to depression, friend (loss of friend), and social determinants   Type II diabetes mellitus (HCC)     "dx'd in 2007; lost weight; no RX for ~ 4 yr now" (09/19/2014)   Past Surgical History:  Procedure Laterality Date   ABDOMINAL HYSTERECTOMY  1985   ANTERIOR CERVICAL DECOMP/DISCECTOMY FUSION  10/2012   in [place] C5-C7    APPENDECTOMY  1985   BACK SURGERY     BREAST CYST EXCISION Right    BREAST EXCISIONAL BIOPSY Right YRS AGO   NEG   CARDIAC CATHETERIZATION   2000's; 2009   CHOLECYSTECTOMY  2002   COLONOSCOPY WITH PROPOFOL N/A 12/12/2016   Procedure: COLONOSCOPY WITH PROPOFOL;  Surgeon: Wyline Mood, MD;  Location: [facility] ENDOSCOPY;  Service: Endoscopy;  Laterality: N/A;   COLONOSCOPY WITH PROPOFOL N/A 02/17/2017   Procedure: COLONOSCOPY WITH PROPOFOL;  Surgeon: Wyline Mood, MD;  Location: [facility] ENDOSCOPY;  Service: Gastroenterology;  Laterality: N/A;   COLONOSCOPY WITH PROPOFOL N/A 11/04/2021   Procedure: COLONOSCOPY WITH PROPOFOL;  Surgeon: Wyline Mood, MD;  Location: [facility] ENDOSCOPY;  Service: Gastroenterology;  Laterality: N/A;   CYSTOSCOPY N/A 05/15/2022   Procedure: CYSTOSCOPY;  Surgeon: Conard Novak, MD;  Location: [facility] ORS;  Service: Gynecology;  Laterality: N/A;   ESOPHAGOGASTRODUODENOSCOPY (EGD) WITH PROPOFOL N/A 02/17/2017   Procedure: ESOPHAGOGASTRODUODENOSCOPY (EGD) WITH PROPOFOL;  Surgeon: Wyline Mood, MD;  Location: [facility] ENDOSCOPY;  Service: Gastroenterology;  Laterality: N/A;   ESOPHAGOGASTRODUODENOSCOPY (EGD) WITH PROPOFOL N/A 03/07/2019   Procedure: ESOPHAGOGASTRODUODENOSCOPY (EGD) WITH PROPOFOL;  Surgeon: Wyline Mood, MD;  Location: [facility] ENDOSCOPY;  Service: Gastroenterology;  Laterality: N/A;   EXCISION/RELEASE BURSA HIP Right 12/1984   HERNIA REPAIR  1985   LEFT HEART CATHETERIZATION WITH CORONARY ANGIOGRAM N/A 12/09/2014   Procedure: LEFT HEART CATHETERIZATION WITH CORONARY ANGIOGRAM;  Surgeon: Kathleene Hazel, MD;  Location: [facility] CATH LAB;  Service: Cardiovascular;  Laterality: N/A;   ROBOTIC ASSISTED BILATERAL SALPINGO OOPHERECTOMY Bilateral 05/15/2022   Procedure: XI  ROBOTIC ASSISTED BILATERAL OOPHORECTOMY;  Surgeon: Conard Novak, MD;  Location: [facility] ORS;  Service: Gynecology;  Laterality: Bilateral;   TONSILLECTOMY AND ADENOIDECTOMY  ~ 1968   TOTAL HIP ARTHROPLASTY Left 04-06-2014   UMBILICAL HERNIA REPAIR  1985   VENA CAVA FILTER PLACEMENT  03/2014   Family History  Problem Relation Age of Onset   Stroke Mother        Deceased   Lung cancer Father        Deceased  Healthy Daughter    Healthy Son        x 2   Other Son        Suicide   Cancer Brother    Heart attack Neg Hx    Social History   Socioeconomic History   Marital status: Single    Spouse name: Not on file   Number of children: Not on file   Years of education: Not on file   Highest education level: Not on file  Occupational History   Not on file  Tobacco Use   Smoking status: Former    Packs/day: 1.50    Years: 10.00    Additional pack years: 0.00    Total pack years: 15.00    Types: Cigarettes    Quit date: 04/12/1979    Years since quitting: 43.8   Smokeless tobacco: Never  Vaping Use   Vaping Use: Never used  Substance and Sexual Activity   Alcohol use: No    Alcohol/week: 0.0 standard drinks of alcohol   Drug use: No   Sexual activity: Not Currently  Other Topics Concern   Not on file  Social History Narrative   Lives with fiancee in an apartment on the first floor.  Has 3 grown children.  One child passed away.  On disability 03-11-95 - 2000, 2007 - current.     Education: some college.   Social Determinants of Health   Financial Resource Strain: Low Risk  (02/09/2023)   Overall Financial Resource Strain (CARDIA)    Difficulty of Paying Living Expenses: Not very hard  Food Insecurity: No Food Insecurity (02/09/2023)   Hunger Vital Sign    Worried About Running Out of Food in the Last Year: Never true    Ran Out of Food in the Last Year: Never true  Transportation Needs: No Transportation Needs (02/09/2023)   PRAPARE - Scientist, research (physical sciences) (Medical): No    Lack of Transportation (Non-Medical): No  Physical Activity: Inactive (02/09/2023)   Exercise Vital Sign    Days of Exercise per Week: 0 days    Minutes of Exercise per Session: 0 min  Stress: No Stress Concern Present (02/09/2023)   [employer] of Occupational Health - Occupational Stress Questionnaire    Feeling of Stress : Not at all  Social Connections: Moderately Isolated (02/09/2023)   Social Connection and Isolation Panel [NHANES]    Frequency of Communication with Friends and Family: More than three times a week    Frequency of Social Gatherings with Friends and Family: More than three times a week    Attends Religious Services: More than 4 times per year    Active Member of [employer] or Organizations: No    Attends Engineer, structural: Never    Marital Status: Divorced    Tobacco Counseling Counseling given: Not Answered   Clinical Intake:  Pre-visit preparation completed: Yes  Pain : No/denies pain     Nutritional Risks: None Diabetes: Yes CBG done?: No Did pt. bring in CBG monitor from home?: No  How often do you need to have someone help you when you read instructions, pamphlets, or other written materials from your doctor or pharmacy?: 1 - Never  Diabetic?yes Nutrition Risk Assessment:  Has the patient had any N/V/D within the last 2 months?  Yes  Does the patient have any non-healing wounds?  No  Has the patient had any unintentional weight loss or weight gain?  No   Diabetes:  Is the patient diabetic?  Yes  If diabetic, was a CBG obtained today?  No  Did the patient bring in their glucometer from home?  No  How often do you monitor your CBG's? Once per day.   Financial Strains and Diabetes Management:  Are you having any financial strains with the device, your supplies or your medication? No .  Does the patient want to be seen by Chronic Care Management for management of their diabetes?  No  Would the  patient like to be referred to a Nutritionist or for Diabetic Management?  No   Diabetic Exams:  Diabetic Eye Exam: Completed 01/16/22.  Pt has been advised about the importance in completing this exam.   Diabetic Foot Exam: Completed 01/31/22. Pt has been advised about the importance in completing this exam.   Interpreter Needed?: No  Information entered by :: Kennedy Bucker, LPN   Activities of Daily Living    02/09/2023   11:44 AM 05/15/2022    7:10 PM  In your present state of health, do you have any difficulty performing the following activities:  Hearing? 0 0  Vision? 0 0  Difficulty concentrating or making decisions? 0 0  Walking or climbing stairs? 1 0  Comment back pain, legs give out   Dressing or bathing? 0 0  Doing errands, shopping? 0 0  Preparing Food and eating ? N   Using the Toilet? N   In the past six months, have you accidently leaked urine? N   Do you have problems with loss of bowel control? N   Managing your Medications? N   Managing your Finances? N   Housekeeping or managing your Housekeeping? N     Patient Care Team: Glori Luis, MD as PCP - General (Family Medicine) Debbe Odea, MD as PCP - Cardiology (Cardiology) Benita Gutter, RN as Oncology Nurse Navigator  Indicate any recent Medical Services you may have received from other than [facility] providers in the past year (date may be approximate).     Assessment:   This is a routine wellness examination for Mckayla.  Hearing/Vision screen Hearing Screening - Comments:: No aids Vision Screening - Comments:: Readers- Dr.Bulakowski  Dietary issues and exercise activities discussed: Current Exercise Habits: The patient does not participate in regular exercise at present   Goals Addressed             This Visit's Progress    DIET - EAT MORE FRUITS AND VEGETABLES         Depression Screen    02/09/2023   11:40 AM 12/10/2022    2:00 PM 11/03/2022    9:34 AM 10/20/2022   11:16  AM 09/09/2022   11:14 AM 06/09/2022    1:55 PM 05/08/2022    3:08 PM  PHQ 2/9 Scores  PHQ - 2 Score 0 2 2 2  0 0 0  PHQ- 9 Score 0 5 5 6        Fall Risk    02/09/2023   11:44 AM 12/10/2022    2:00 PM 11/03/2022    9:34 AM 10/20/2022   11:16 AM 09/09/2022   11:14 AM  Fall Risk   Falls in the past year? 0 1 1 0 0  Number falls in past yr: 0 0 0 0 0  Injury with Fall? 0 0 0 0 0  Risk for fall due to : No Fall Risks History of fall(s) History of fall(s) No Fall Risks No Fall Risks  Follow up  Falls prevention discussed;Falls evaluation completed Falls evaluation completed Falls evaluation completed Falls evaluation completed Falls evaluation completed    FALL RISK PREVENTION PERTAINING TO THE HOME:  Any stairs in or around the home? No  If so, are there any without handrails? No  Home free of loose throw rugs in walkways, pet beds, electrical cords, etc? Yes  Adequate lighting in your home to reduce risk of falls? Yes   ASSISTIVE DEVICES UTILIZED TO PREVENT FALLS:  Life alert? No  Use of a cane, walker or w/c? Yes - rollator Grab bars in the bathroom? Yes  Shower chair or bench in shower? No  Elevated toilet seat or a handicapped toilet? No   Cognitive Function:    01/22/2018    9:56 AM 01/20/2017    4:43 PM  MMSE - Mini Mental State Exam  Orientation to time    Orientation to Place    Registration    Attention/ Calculation    Recall    Language- name 2 objects    Language- repeat    Language- follow 3 step command    Language- read & follow direction    Write a sentence    Copy design    Total score       Information is confidential and restricted. Go to Review Flowsheets to unlock data.        02/09/2023   11:51 AM 01/30/2021   11:39 AM 01/27/2020   11:12 AM 01/25/2019   12:02 PM 01/20/2017    4:30 PM  6CIT Screen  What Year? 0 points 0 points 0 points    What month? 0 points 0 points 0 points    What time? 0 points 0 points 0 points    Count back from 20 0 points 0  points     Months in reverse 0 points 0 points 0 points    Repeat phrase 0 points 0 points 0 points    Total Score 0 points 0 points        Information is confidential and restricted. Go to Review Flowsheets to unlock data.    Immunizations Immunization History  Administered Date(s) Administered   PNEUMOCOCCAL CONJUGATE-20 08/27/2021    TDAP status: Due, Education has been provided regarding the importance of this vaccine. Advised may receive this vaccine at local pharmacy or Health Dept. Aware to provide a copy of the vaccination record if obtained from local pharmacy or Health Dept. Verbalized acceptance and understanding.  Flu Vaccine status: Declined, Education has been provided regarding the importance of this vaccine but patient still declined. Advised may receive this vaccine at local pharmacy or Health Dept. Aware to provide a copy of the vaccination record if obtained from local pharmacy or Health Dept. Verbalized acceptance and understanding.  Pneumococcal vaccine status: Up to date  Covid-19 vaccine status: Declined, Education has been provided regarding the importance of this vaccine but patient still declined. Advised may receive this vaccine at local pharmacy or Health Dept.or vaccine clinic. Aware to provide a copy of the vaccination record if obtained from local pharmacy or Health Dept. Verbalized acceptance and understanding.  Qualifies for Shingles Vaccine? Yes   Zostavax completed No   Shingrix Completed?: Yes  Screening Tests Health Maintenance  Topic Date Due   DTaP/Tdap/Td (1 - Tdap) Never done   OPHTHALMOLOGY EXAM  01/17/2023   FOOT EXAM  02/01/2023   INFLUENZA VACCINE  04/02/2023   HEMOGLOBIN A1C  06/11/2023   Diabetic kidney evaluation - Urine ACR  09/10/2023   MAMMOGRAM  10/28/2023   Diabetic kidney evaluation - eGFR measurement  12/30/2023   Medicare Annual Wellness (AWV)  02/09/2024   Colonoscopy  11/04/2024   Pneumonia Vaccine 58+ Years old   Completed   DEXA SCAN  Completed   Hepatitis C Screening  Completed   HPV VACCINES  Aged Out   COVID-19 Vaccine  Discontinued   Zoster Vaccines- Shingrix  Discontinued    Health Maintenance  Health Maintenance Due  Topic Date Due   DTaP/Tdap/Td (1 - Tdap) Never done   OPHTHALMOLOGY EXAM  01/17/2023   FOOT EXAM  02/01/2023    Colorectal cancer screening: Type of screening: Colonoscopy. Completed 11/04/21. Repeat every 3 years  Mammogram status: Completed 10/27/22. Repeat every year  Bone Density status: Completed 10/19/19. Results reflect: Bone density results: NORMAL. Repeat every 5 years.  Lung Cancer Screening: (Low Dose CT Chest recommended if Age 66-80 years, 30 pack-year currently smoking OR have quit w/in 15years.) does not qualify.    Additional Screening:  Hepatitis C Screening: does qualify; Completed 01/12/17  Vision Screening: Recommended annual ophthalmology exams for early detection of glaucoma and other disorders of the eye. Is the patient up to date with their annual eye exam?  Yes  Who is the provider or what is the name of the office in which the patient attends annual eye exams? Dr.Bulakowski If pt is not established with a provider, would they like to be referred to a provider to establish care? No .   Dental Screening: Recommended annual dental exams for proper oral hygiene  Community Resource Referral / Chronic Care Management: CRR required this visit?  No   CCM required this visit?  No      Plan:     I have personally reviewed and noted the following in the patient's chart:   Medical and social history Use of alcohol, tobacco or illicit drugs  Current medications and supplements including opioid prescriptions. Patient is currently taking opioid prescriptions. Information provided to patient regarding non-opioid alternatives. Patient advised to discuss non-opioid treatment plan with their provider. Functional ability and status Nutritional  status Physical activity Advanced directives List of other physicians Hospitalizations, surgeries, and ER visits in previous 12 months Vitals Screenings to include cognitive, depression, and falls Referrals and appointments  In addition, I have reviewed and discussed with patient certain preventive protocols, quality metrics, and best practice recommendations. A written personalized care plan for preventive services as well as general preventive health recommendations were provided to patient.     Hal Hope, LPN   [id number]/61/0960   Nurse Notes: none

## 2023-02-12 ENCOUNTER — Encounter: Payer: Self-pay | Admitting: Cardiology

## 2023-02-12 ENCOUNTER — Ambulatory Visit (INDEPENDENT_AMBULATORY_CARE_PROVIDER_SITE_OTHER): Payer: 59

## 2023-02-12 ENCOUNTER — Ambulatory Visit: Payer: 59 | Attending: Cardiology | Admitting: Cardiology

## 2023-02-12 VITALS — BP 96/62 | HR 67 | Ht 66.0 in | Wt 198.6 lb

## 2023-02-12 DIAGNOSIS — I499 Cardiac arrhythmia, unspecified: Secondary | ICD-10-CM

## 2023-02-12 DIAGNOSIS — E7800 Pure hypercholesterolemia, unspecified: Secondary | ICD-10-CM | POA: Diagnosis not present

## 2023-02-12 DIAGNOSIS — I10 Essential (primary) hypertension: Secondary | ICD-10-CM

## 2023-02-12 DIAGNOSIS — I2510 Atherosclerotic heart disease of native coronary artery without angina pectoris: Secondary | ICD-10-CM

## 2023-02-12 DIAGNOSIS — I2584 Coronary atherosclerosis due to calcified coronary lesion: Secondary | ICD-10-CM

## 2023-02-12 NOTE — Progress Notes (Signed)
Cardiology Office Note:    Date:  02/12/2023   ID:  Valerie Vazquez, DOB 1956-07-03, MRN [id number]  PCP:  Glori Luis, MD   [facility]  Cardiologist:  Debbe Odea, MD  Advanced Practice Provider:  No care team member to display Electrophysiologist:  None       Referring MD: Glori Luis, MD   Chief Complaint  Patient presents with   Follow-up    Patient is concerned with irregular heartbeats where she feels like heart slows down or skips a beat.  Blood pressure check in office 96/62 today.    History of Present Illness:    Valerie Vazquez is a 67 y.o. female with a hx of Schizoaffective disorder, diabetes, coronary vasospasm hypertension, history of DVT on Eliquis who presents for follow-up.    States having irregular heartbeats ongoing over the past several months.  Also feels like her heart rate has slowed down some.  Blood pressures have been running low in the 90s to low 100s.  Recently started Ozempic for weight loss.  Takes Lasix only as needed.  Takes Imdur 30 mg daily for coronary vasospasm.  Prior notes  Lexiscan 07/2020 low risk study, no evidence for ischemia, EF 55 to 65% Echocardiogram 03/2019, normal systolic function, EF 60 to 65%, impaired relaxation.  Past Medical History:  Diagnosis Date   Acute right-sided low back pain with right-sided sciatica 10/23/2017   Anemia    Anginal pain (HCC)    Bilateral ovarian cysts    Bilateral renal cysts    Bulimia nervosa 05/07/2021   CAD (coronary artery disease) 09/28/2016   Chest pain 10/14/2015   Chronic back pain    "upper and lower" (09/19/2014)   Chronic shoulder pain (Location of Primary Source of Pain) (Left) 01/29/2016   Colitis 04/08/2016   Colon polyp 01/30/2017   Coronary vasospasm (HCC) 12/09/2014   DDD (degenerative disc disease), cervical    a.) s/p ACDF C5-C7   Depression    DVT (deep venous thrombosis) (HCC)    Esophageal spasm    Essential  hypertension 10/28/2013   Gallstones    Gastritis    Gastroenteritis    GERD (gastroesophageal reflux disease)    Heart disease 12/09/2016   High blood pressure    High cholesterol    History of blood transfusion 1985   related to hysterectomy   History of left heart catheterization (LHC) 12/09/2014   a.) LHC 12/09/2014: EF 50%; normal coronaries.   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 Rule out Paget's disease of the bone with blood work.    Hyperlipidemia 10/28/2013   Hypertension    Hypothyroidism    IBS (irritable bowel syndrome)    Long term current use of anticoagulant    a.) apixaban   Lumbar central spinal stenosis (moderate at L4-5; mild at L2-3 and L3-4) 01/29/2016   Migraine    "a few times/yr" (09/19/2014)   Myocardial infarction (HCC) 10/2010 X 3   "while hospitalized"   Neck pain 01/29/2016   Osteoarthritis    Pleural effusion, bilateral 07/31/2015   Pneumonia 04/2014   PONV (postoperative nausea and vomiting)    PTSD (post-traumatic stress disorder)    Pulmonary embolism (HCC) 04/2001; 09/19/2014   "after gallbladder OR; "  Schizoaffective disorder, depressive type (HCC) 08/28/2016   Sleep apnea    "mild" (09/19/2014)   Snoring    a. sleep study 5/16: No OSA   SOB (shortness of breath) 07/31/2015   Spinal stenosis    Spondylosis    Suicidal ideation 08/27/2016   a.) seen in ED with approx 6 months of reported SI. Reported ingestion of "unknown pills" to end life; last ingestion was 3 days PTA; UDS (+) for TCAs. SI related to depression, friend (loss of friend), and social determinants   Type II diabetes mellitus (HCC)    "dx'd in 2007; lost weight; no RX for ~ 4 yr now" (09/19/2014)    Past Surgical History:  Procedure Laterality Date   ABDOMINAL HYSTERECTOMY  1985    ANTERIOR CERVICAL DECOMP/DISCECTOMY FUSION  10/2012   in [place] C5-C7    APPENDECTOMY  1985   BACK SURGERY     BREAST CYST EXCISION Right    BREAST EXCISIONAL BIOPSY Right YRS AGO   NEG   CARDIAC CATHETERIZATION   2000's; 2009   CHOLECYSTECTOMY  2002   COLONOSCOPY WITH PROPOFOL N/A 12/12/2016   Procedure: COLONOSCOPY WITH PROPOFOL;  Surgeon: Wyline Mood, MD;  Location: [facility] ENDOSCOPY;  Service: Endoscopy;  Laterality: N/A;   COLONOSCOPY WITH PROPOFOL N/A 02/17/2017   Procedure: COLONOSCOPY WITH PROPOFOL;  Surgeon: Wyline Mood, MD;  Location: [facility] ENDOSCOPY;  Service: Gastroenterology;  Laterality: N/A;   COLONOSCOPY WITH PROPOFOL N/A 11/04/2021   Procedure: COLONOSCOPY WITH PROPOFOL;  Surgeon: Wyline Mood, MD;  Location: [facility] ENDOSCOPY;  Service: Gastroenterology;  Laterality: N/A;   CYSTOSCOPY N/A 05/15/2022   Procedure: CYSTOSCOPY;  Surgeon: Conard Novak, MD;  Location: [facility] ORS;  Service: Gynecology;  Laterality: N/A;   ESOPHAGOGASTRODUODENOSCOPY (EGD) WITH PROPOFOL N/A 02/17/2017   Procedure: ESOPHAGOGASTRODUODENOSCOPY (EGD) WITH PROPOFOL;  Surgeon: Wyline Mood, MD;  Location: [facility] ENDOSCOPY;  Service: Gastroenterology;  Laterality: N/A;   ESOPHAGOGASTRODUODENOSCOPY (EGD) WITH PROPOFOL N/A 03/07/2019   Procedure: ESOPHAGOGASTRODUODENOSCOPY (EGD) WITH PROPOFOL;  Surgeon: Wyline Mood, MD;  Location: [facility] ENDOSCOPY;  Service: Gastroenterology;  Laterality: N/A;   EXCISION/RELEASE BURSA HIP Right 12/1984   HERNIA REPAIR  1985   LEFT HEART CATHETERIZATION WITH CORONARY ANGIOGRAM N/A 12/09/2014   Procedure: LEFT HEART CATHETERIZATION WITH CORONARY ANGIOGRAM;  Surgeon: Kathleene Hazel, MD;  Location: [facility] CATH LAB;  Service: Cardiovascular;  Laterality: N/A;   ROBOTIC ASSISTED BILATERAL SALPINGO OOPHERECTOMY Bilateral 05/15/2022   Procedure: XI ROBOTIC ASSISTED BILATERAL OOPHORECTOMY;  Surgeon: Conard Novak, MD;  Location: [facility] ORS;  Service: Gynecology;  Laterality: Bilateral;    TONSILLECTOMY AND ADENOIDECTOMY  ~ 1968   TOTAL HIP ARTHROPLASTY Left 04-06-2014   UMBILICAL HERNIA REPAIR  1985   VENA CAVA FILTER PLACEMENT  03/2014    Current Medications: Current Meds  Medication Sig   acetaminophen (TYLENOL) 500 MG tablet Take 1 tablet (500 mg total) by mouth every 6 (six) hours as needed.   baclofen (LIORESAL) 10 MG tablet TAKE ONE TABLET BY MOUTH THREE TIMES DAILY AS NEEDED FOR MUSCLE SPASMS (VIAL)   blood glucose meter kit and supplies KIT Dispense based on patient and insurance preference. Use once daily fasting. Dx code E11.9.   Blood Glucose Monitoring Suppl (GHT BLOOD GLUCOSE MONITOR) w/Device KIT DISPENSE BASED ON PATIENT AND INSURANCE PREFERENCE. USE ONCE DAILY AS DIRECTED. DX CODE E11.9.   dicyclomine (BENTYL) 20 MG tablet TAKE ONE TABLET BY MOUTH THREE TIMES DAILY AS NEEDED FOR ABDOMINAL SPASMS (VIAL)   diphenhydrAMINE (BENADRYL) 25 MG tablet Take 1  tablet (25 mg total) by mouth every 6 (six) hours.   ELIQUIS 5 MG TABS tablet TAKE ONE TABLET BY MOUTH TWICE DAILY @ 9AM-5PM (VIAL)   escitalopram (LEXAPRO) 20 MG tablet TAKE ONE TABLET BY MOUTH DAILY AT 9AM   famotidine (PEPCID) 20 MG tablet TAKE ONE TABLET BY MOUTH DAILY AT 9PM AT BEDTIME (VIAL)   fluticasone (FLONASE) 50 MCG/ACT nasal spray Place 2 sprays into both nostrils daily.   furosemide (LASIX) 20 MG tablet TAKE ONE TABLET BY MOUTH DAILY AS NEEDED FOR EDEMA (VIAL)   gabapentin (NEURONTIN) 600 MG tablet TAKE ONE TABLET BY MOUTH DAILY AT 9AM and TAKE 1 AND 1/2 TABLETS DAILY AT 9PM AT BEDTIME   isosorbide mononitrate (IMDUR) 30 MG 24 hr tablet TAKE ONE TABLET BY MOUTH DAILY AT 9AM (VIAL)   JARDIANCE 25 MG TABS tablet TAKE ONE TABLET BY MOUTH DAILY AT 9AM (VIAL)   levothyroxine (SYNTHROID) 25 MCG tablet TAKE ONE TABLET BY MOUTH DAILY AT 7AM BEFORE BREAKFAST AND 2-3 HOURS BEFORE TAKING ANY OTHER MEDICTION OR SUPPLEMENT   nitroGLYCERIN (NITROSTAT) 0.4 MG SL tablet Place 1 tablet (0.4 mg total) under the tongue  every 5 (five) minutes as needed for chest pain.   OLANZapine (ZYPREXA) 10 MG tablet TAKE 1 TABLET BY MOUTH EVERYDAY AT BEDTIME   omeprazole (PRILOSEC) 40 MG capsule Take 1 capsule (40 mg total) by mouth in the morning and at bedtime.   ondansetron (ZOFRAN-ODT) 4 MG disintegrating tablet Take 1 tablet (4 mg total) by mouth every 6 (six) hours as needed for nausea.   OVER THE COUNTER MEDICATION 200 mg daily. Magnesium Citrate Gummy   OVER THE COUNTER MEDICATION Power C 2 gummies daily   pantoprazole (PROTONIX) 40 MG tablet TAKE ONE TABLET BY MOUTH DAILY AT 9AM   potassium chloride SA (KLOR-CON M) 20 MEQ tablet TAKE ONE TABLET BY MOUTH DAILY AS NEEDED TAKE WITH LASIX (VIAL)   rosuvastatin (CRESTOR) 40 MG tablet TAKE ONE TABLET BY MOUTH DAILY AT 5PM   Semaglutide,0.25 or 0.5MG /DOS, (OZEMPIC, 0.25 OR 0.5 MG/DOSE,) 2 MG/3ML SOPN Inject 0.5 mg into the skin once a week.   tobramycin-dexamethasone (TOBRADEX) ophthalmic solution Place 2 drops into the left eye every 6 (six) hours.   vortioxetine HBr (TRINTELLIX) 20 MG TABS tablet Take 20 mg by mouth daily.   Zinc Sulfate (ZINC 15 PO) Take by mouth daily.     Allergies:   Abilify [aripiprazole], Augmentin [amoxicillin-pot clavulanate], Bactrim [sulfamethoxazole-trimethoprim], Ceftin [cefuroxime axetil], Coconut (cocos nucifera), Diclofenac, Dye fdc red [red dye], Indocin [indomethacin], Linaclotide, Lisinopril, Morphine, Morphine and codeine, Simvastatin, Sulfa antibiotics, Sulfamethoxazole-trimethoprim, Wellbutrin [bupropion], Zetia [ezetimibe], Quetiapine fumarate, and Quetiapine fumarate   Social History   Socioeconomic History   Marital status: Single    Spouse name: Not on file   Number of children: Not on file   Years of education: Not on file   Highest education level: Not on file  Occupational History   Not on file  Tobacco Use   Smoking status: Former    Packs/day: 1.50    Years: 10.00    Additional pack years: 0.00    Total pack  years: 15.00    Types: Cigarettes    Quit date: 04/12/1979    Years since quitting: 43.8   Smokeless tobacco: Never  Vaping Use   Vaping Use: Never used  Substance and Sexual Activity   Alcohol use: No    Alcohol/week: 0.0 standard drinks of alcohol   Drug use: No   Sexual  activity: Not Currently  Other Topics Concern   Not on file  Social History Narrative   Lives with fiancee in an apartment on the first floor.  Has 3 grown children.  One child passed away.  On disability 03/01/1995 - 2000, 2007 - current.     Education: some college.   Social Determinants of Health   Financial Resource Strain: Low Risk  (02/09/2023)   Overall Financial Resource Strain (CARDIA)    Difficulty of Paying Living Expenses: Not very hard  Food Insecurity: No Food Insecurity (02/09/2023)   Hunger Vital Sign    Worried About Running Out of Food in the Last Year: Never true    Ran Out of Food in the Last Year: Never true  Transportation Needs: No Transportation Needs (02/09/2023)   PRAPARE - Administrator, Civil Service (Medical): No    Lack of Transportation (Non-Medical): No  Physical Activity: Inactive (02/09/2023)   Exercise Vital Sign    Days of Exercise per Week: 0 days    Minutes of Exercise per Session: 0 min  Stress: No Stress Concern Present (02/09/2023)   [employer] of Occupational Health - Occupational Stress Questionnaire    Feeling of Stress : Not at all  Social Connections: Moderately Isolated (02/09/2023)   Social Connection and Isolation Panel [NHANES]    Frequency of Communication with Friends and Family: More than three times a week    Frequency of Social Gatherings with Friends and Family: More than three times a week    Attends Religious Services: More than 4 times per year    Active Member of [employer] or Organizations: No    Attends Banker Meetings: Never    Marital Status: Divorced     Family History: The patient's family history includes Cancer in her  brother; Healthy in her daughter and son; Lung cancer in her father; Other in her son; Stroke in her mother. There is no history of Heart attack.  ROS:   Please see the history of present illness.     All other systems reviewed and are negative.  EKGs/Labs/Other Studies Reviewed:    The following studies were reviewed today:   EKG:  EKG is  ordered today.  The ekg ordered today demonstrates normal sinus rhythm  Recent Labs: 04/11/2022: TSH 1.16 05/21/2022: ALT 27 12/10/2022: Hemoglobin 13.7; Platelets 315.0 12/30/2022: BUN 9; Creatinine, Ser 1.03; Potassium 4.3; Sodium 140  Recent Lipid Panel    Component Value Date/Time   CHOL 194 11/27/2021 1032   TRIG 105.0 11/27/2021 1032   HDL 54.00 11/27/2021 1032   CHOLHDL 4 11/27/2021 1032   VLDL 21.0 11/27/2021 1032   LDLCALC 119 (H) 11/27/2021 1032   LDLDIRECT 108.0 03/17/2022 1422     Risk Assessment/Calculations:      Physical Exam:    VS:  BP 96/62 (BP Location: Left Arm, Patient Position: Sitting, Cuff Size: Large)   Pulse 67   Ht 5\' 6"  (1.676 m)   Wt 198 lb 9.6 oz (90.1 kg)   SpO2 95%   BMI 32.05 kg/m     Wt Readings from Last 3 Encounters:  02/12/23 198 lb 9.6 oz (90.1 kg)  02/09/23 205 lb (93 kg)  01/20/23 205 lb (93 kg)     GEN:  Well nourished, well developed in no acute distress HEENT: Normal NECK: No JVD; No carotid bruits CARDIAC: RRR, no murmurs, rubs, gallops RESPIRATORY:  Clear to auscultation without rales, wheezing or rhonchi  ABDOMEN: Soft, non-tender,  non-distended MUSCULOSKELETAL:  trace edema; No deformity  SKIN: Warm and dry NEUROLOGIC:  Alert and oriented x 3 PSYCHIATRIC:  Normal affect   ASSESSMENT:    1. Irregular heart beat   2. Primary hypertension   3. Pure hypercholesterolemia   4. Coronary artery calcification    PLAN:    In order of problems listed above:  Irregular heartbeat, place cardiac monitor to evaluate any significant arrhythmias. Hypertension, BP low normal.   Reduce Imdur to 15 mg daily.  Continue Lasix as needed. Hyperlipidemia, continue Crestor 40 mg daily. Coronary artery calcification on chest CT.  History of coronary vasospasm.  Continue Crestor, Imdur.  No anginal symptoms.  Patient on Eliquis due to history of DVT  Follow-up in 6 weeks.    Medication Adjustments/Labs and Tests Ordered: Current medicines are reviewed at length with the patient today.  Concerns regarding medicines are outlined above.  Orders Placed This Encounter  Procedures   LONG TERM MONITOR (3-14 DAYS)   EKG 12-Lead    No orders of the defined types were placed in this encounter.    Patient Instructions  Medication Instructions:   Decrease IMDUR - TAKE 0.5 TABLET BY MOUTH DAILY.   *If you need a refill on your cardiac medications before your next appointment, please call your pharmacy*   Lab Work:  None Ordered  If you have labs (blood work) drawn today and your tests are completely normal, you will receive your results only by: MyChart Message (if you have MyChart) OR A paper copy in the mail If you have any lab test that is abnormal or we need to change your treatment, we will call you to review the results.   Testing/Procedures:  Your physician has recommended that you wear a Zio monitor.   This monitor is a medical device that records the heart's electrical activity. Doctors most often use these monitors to diagnose arrhythmias. Arrhythmias are problems with the speed or rhythm of the heartbeat. The monitor is a small device applied to your chest. You can wear one while you do your normal daily activities. While wearing this monitor if you have any symptoms to push the button and record what you felt. Once you have worn this monitor for the period of time provider prescribed (Usually 14 days), you will return the monitor device in the postage paid box. Once it is returned they will download the data collected and provide [place] with a report which the  provider will then review and we will call you with those results. Important tips:  Avoid showering during the first 24 hours of wearing the monitor. Avoid excessive sweating to help maximize wear time. Do not submerge the device, no hot tubs, and no swimming pools. Keep any lotions or oils away from the patch. After 24 hours you may shower with the patch on. Take brief showers with your back facing the shower head.  Do not remove patch once it has been placed because that will interrupt data and decrease adhesive wear time. Push the button when you have any symptoms and write down what you were feeling. Once you have completed wearing your monitor, remove and place into box which has postage paid and place in your outgoing mailbox.  If for some reason you have misplaced your box then call our office and we can provide another box and/or mail it off for you.     Follow-Up: At [facility], you and your health needs are our priority.  As  part of our continuing mission to provide you with exceptional heart care, we have created designated Provider Care Teams.  These Care Teams include your primary Cardiologist (physician) and Advanced Practice Providers (APPs -  Physician Assistants and Nurse Practitioners) who all work together to provide you with the care you need, when you need it.  We recommend signing up for the patient portal called "MyChart".  Sign up information is provided on this After Visit Summary.  MyChart is used to connect with patients for Virtual Visits (Telemedicine).  Patients are able to view lab/test results, encounter notes, upcoming appointments, etc.  Non-urgent messages can be sent to your provider as well.   To learn more about what you can do with MyChart, go to [email or web address].    Your next appointment:    After testing  Provider:   You may see Debbe Odea, MD or one of the following Advanced Practice Providers on your designated Care Team:    Nicolasa Ducking, NP Eula Listen, PA-C Cadence Fransico Michael, PA-C Charlsie Quest, NP   Signed, Debbe Odea, MD  02/12/2023 12:29 PM    [facility]

## 2023-02-12 NOTE — Patient Instructions (Signed)
Medication Instructions:   Decrease IMDUR - TAKE 0.5 TABLET BY MOUTH DAILY.   *If you need a refill on your cardiac medications before your next appointment, please call your pharmacy*   Lab Work:  None Ordered  If you have labs (blood work) drawn today and your tests are completely normal, you will receive your results only by: MyChart Message (if you have MyChart) OR A paper copy in the mail If you have any lab test that is abnormal or we need to change your treatment, we will call you to review the results.   Testing/Procedures:  Your physician has recommended that you wear a Zio monitor.   This monitor is a medical device that records the heart's electrical activity. Doctors most often use these monitors to diagnose arrhythmias. Arrhythmias are problems with the speed or rhythm of the heartbeat. The monitor is a small device applied to your chest. You can wear one while you do your normal daily activities. While wearing this monitor if you have any symptoms to push the button and record what you felt. Once you have worn this monitor for the period of time provider prescribed (Usually 14 days), you will return the monitor device in the postage paid box. Once it is returned they will download the data collected and provide [place] with a report which the provider will then review and we will call you with those results. Important tips:  Avoid showering during the first 24 hours of wearing the monitor. Avoid excessive sweating to help maximize wear time. Do not submerge the device, no hot tubs, and no swimming pools. Keep any lotions or oils away from the patch. After 24 hours you may shower with the patch on. Take brief showers with your back facing the shower head.  Do not remove patch once it has been placed because that will interrupt data and decrease adhesive wear time. Push the button when you have any symptoms and write down what you were feeling. Once you have completed wearing your  monitor, remove and place into box which has postage paid and place in your outgoing mailbox.  If for some reason you have misplaced your box then call our office and we can provide another box and/or mail it off for you.     Follow-Up: At [facility], you and your health needs are our priority.  As part of our continuing mission to provide you with exceptional heart care, we have created designated Provider Care Teams.  These Care Teams include your primary Cardiologist (physician) and Advanced Practice Providers (APPs -  Physician Assistants and Nurse Practitioners) who all work together to provide you with the care you need, when you need it.  We recommend signing up for the patient portal called "MyChart".  Sign up information is provided on this After Visit Summary.  MyChart is used to connect with patients for Virtual Visits (Telemedicine).  Patients are able to view lab/test results, encounter notes, upcoming appointments, etc.  Non-urgent messages can be sent to your provider as well.   To learn more about what you can do with MyChart, go to [email or web address].    Your next appointment:    After testing  Provider:   You may see Debbe Odea, MD or one of the following Advanced Practice Providers on your designated Care Team:   Nicolasa Ducking, NP Eula Listen, PA-C Cadence Fransico Michael, PA-C Charlsie Quest, NP

## 2023-02-14 DIAGNOSIS — I499 Cardiac arrhythmia, unspecified: Secondary | ICD-10-CM | POA: Diagnosis not present

## 2023-02-18 ENCOUNTER — Emergency Department
Admission: EM | Admit: 2023-02-18 | Discharge: 2023-02-18 | Disposition: A | Discharge status: Home / Self Care | Payer: 59 | Attending: Emergency Medicine | Admitting: Emergency Medicine

## 2023-02-18 ENCOUNTER — Telehealth: Payer: Self-pay

## 2023-02-18 ENCOUNTER — Emergency Department: Payer: 59

## 2023-02-18 DIAGNOSIS — R0789 Other chest pain: Secondary | ICD-10-CM | POA: Diagnosis not present

## 2023-02-18 DIAGNOSIS — R2231 Localized swelling, mass and lump, right upper limb: Secondary | ICD-10-CM | POA: Diagnosis not present

## 2023-02-18 DIAGNOSIS — E119 Type 2 diabetes mellitus without complications: Secondary | ICD-10-CM | POA: Diagnosis not present

## 2023-02-18 DIAGNOSIS — M25521 Pain in right elbow: Secondary | ICD-10-CM | POA: Insufficient documentation

## 2023-02-18 DIAGNOSIS — R079 Chest pain, unspecified: Secondary | ICD-10-CM | POA: Insufficient documentation

## 2023-02-18 DIAGNOSIS — Z7901 Long term (current) use of anticoagulants: Secondary | ICD-10-CM | POA: Insufficient documentation

## 2023-02-18 DIAGNOSIS — M7989 Other specified soft tissue disorders: Secondary | ICD-10-CM | POA: Diagnosis not present

## 2023-02-18 DIAGNOSIS — M79661 Pain in right lower leg: Secondary | ICD-10-CM | POA: Diagnosis not present

## 2023-02-18 DIAGNOSIS — J90 Pleural effusion, not elsewhere classified: Secondary | ICD-10-CM | POA: Diagnosis not present

## 2023-02-18 DIAGNOSIS — M19011 Primary osteoarthritis, right shoulder: Secondary | ICD-10-CM | POA: Diagnosis not present

## 2023-02-18 LAB — BASIC METABOLIC PANEL
Anion gap: 9 (ref 5–15)
BUN: 7 mg/dL — ABNORMAL LOW (ref 8–23)
CO2: 23 mmol/L (ref 22–32)
Calcium: 8.7 mg/dL — ABNORMAL LOW (ref 8.9–10.3)
Chloride: 109 mmol/L (ref 98–111)
Creatinine, Ser: 0.74 mg/dL (ref 0.44–1.00)
GFR, Estimated: >60 mL/min (ref >60)
Glucose, Bld: 95 mg/dL (ref 70–99)
Potassium: 3.3 mmol/L — ABNORMAL LOW (ref 3.5–5.1)
Sodium: 141 mmol/L (ref 135–145)

## 2023-02-18 LAB — CBC
HCT: 37.8 % (ref 36.0–46.0)
Hemoglobin: 12.6 g/dL (ref 12.0–15.0)
MCH: 31.7 pg (ref 26.0–34.0)
MCHC: 33.3 g/dL (ref 30.0–36.0)
MCV: 95 fL (ref 80.0–100.0)
Platelets: 263 10*3/uL (ref 150–400)
RBC: 3.98 MIL/uL (ref 3.87–5.11)
RDW: 14.1 % (ref 11.5–15.5)
WBC: 6.4 10*3/uL (ref 4.0–10.5)
nRBC: 0 % (ref 0.0–0.2)

## 2023-02-18 LAB — TROPONIN I (HIGH SENSITIVITY): Troponin I (High Sensitivity): 3 ng/L (ref <18)

## 2023-02-18 NOTE — ED Triage Notes (Signed)
Patient is here today for atraumatic RIGHT elbow pain and swelling that began yesterday; She does have history of DVTs and Pes and is on Eliquis for this; Elbow is tender to touch, denies h/o of gout or RA; She is also having come CP but relates this to cervical spinal issues that she has and that her neck pain sometimes radiates to her chest and this feels similar to that; She called her PCP this morning and nurse advised her to come to ED for CP evaluation

## 2023-02-18 NOTE — ED Provider Notes (Signed)
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 female with history of diabetes, coronary vasospasm, DVT on Eliquis presenting to the emergency department for evaluation of elbow pain and chest pain.  Patient reports she has had having ongoing mild chest pain with sensations of irregular heartbeat for which she is actually following up with cardiology and has a heart monitor in place.  Yesterday, she began to notice some mild pain in her right elbow.  She took Tylenol with resolution of her pain and went to bed.  Today, she noticed recurrent pain in her elbow, now radiating up and down her arm with a palpable area of swelling over the side of the elbow.  She called her primary care doctor about this, but when she stated she been having chest pain, they directed her to the ER for further evaluation.  She denies any change in her chest pain over the past several days.  No shortness of breath.  Has a history of PE, does not feel that this is similar.     Physical Exam   Triage Vital Signs: ED Triage Vitals  Enc Vitals Group     BP 02/18/23 1439 124/75     Pulse Rate 02/18/23 1439 78     Resp 02/18/23 1439 19     Temp 02/18/23 1439 98.7 F (37.1 C)     Temp Source 02/18/23 1439 Oral     SpO2 02/18/23 1439 95 %     Weight 02/18/23 1428 196 lb (88.9 kg)     Height 02/18/23 1428 5'  6" (1.676 m)     Head Circumference --      Peak Flow --      Pain Score 02/18/23 1429 7     Pain Loc --      Pain Edu? --      Excl. in GC? --     Most recent vital signs: Vitals:   02/18/23 1439 02/18/23 1711  BP: 124/75 (!) 174/90  Pulse: 78 (!) 58  Resp: 19 16  Temp: 98.7 F (37.1 C) 97.9 F (36.6 C)  SpO2: 95% 100%     General: Awake, interactive  CV:  Regular rate, good peripheral perfusion.  Resp:  Lungs clear, unlabored respirations.  Abd:  Soft, nondistended.  Neuro:  Symmetric facial movement, fluid speech MSK:  There is a area of palpable swelling along the lateral aspect of the right elbow.  There is no overlying erythema, warmth, other overall skin changes.  Patient is able to fully range the elbow with only mild discomfort.  No significant appreciable effusion.  Sensation is intact throughout the extremity.  2+ radial pulses bilaterally.  Full range of motion of the remainder of extremities without pain.   ED Results / Procedures /  Treatments   Labs (all labs ordered are listed, but only abnormal results are displayed) Labs Reviewed  BASIC METABOLIC PANEL - Abnormal; Notable for the following components:      Result Value   Potassium 3.3 (*)    BUN 7 (*)    Calcium 8.7 (*)    All other components within normal limits  CBC  TROPONIN I (HIGH SENSITIVITY)     EKG EKG independently reviewed interpreted by myself (ER attending) demonstrates:  EKG demonstrates normal sinus rhythm at a rate of 88, PR 140, QRS 86, QTc 396, nonspecific ST changes  RADIOLOGY Imaging independently reviewed and interpreted by myself demonstrates:  CXR without focal pneumonia Elbow x-Orit Sanville without fracture, multiple areas of chronic connective tissue changes noted by radiology some which are consistent with location of pain pain  PROCEDURES:  Critical Care performed: No  Procedures   MEDICATIONS ORDERED IN ED: Medications - No data to display   IMPRESSION / MDM /  ASSESSMENT AND PLAN / ED COURSE  I reviewed the triage vital signs and the nursing notes.  Differential diagnosis includes, but is not limited to, DVT, chronic degenerative changes, pneumonia, overall low suspicion ACS, arrhythmia  Patient's presentation is most consistent with acute presentation with potential threat to life or bodily function.  67 year old female presenting with right elbow pain and ongoing chest pain.  Regarding her chest pain, clinical history quite atypical for ACS.  Lab work including troponin reassuring.  EKG without acute ischemic findings.  Overall low suspicion for emergent pathology.  Is following up as an outpatient and has a heart monitor in place.  She does have a history of DVT/PE and does report some swelling and pain throughout her arm.  Will I suspect that this may be related to the chronic changes noted on her x-Imonie Tuch, she has missed a couple doses of her Eliquis, so we will obtain an ultrasound of the right upper extremity to further evaluate for possible DVT.  Ultrasound fortunately without evidence of DVT.  Patient reevaluated without new complaints.  Does seem to have improved pain when her elbow is held in flexion, so we will provide a sling for comfort, but did discuss the importance of regularly ranging her elbow to prevent contractures.  She was given as needed information for follow-up with orthopedics.  Did discuss supportive care measures.  Strict return precautions were provided.  She was discharged in stable condition.     FINAL CLINICAL IMPRESSION(S) / ED DIAGNOSES   Final diagnoses:  Right elbow pain  Nonspecific chest pain     Rx / DC Orders   ED Discharge Orders     None        Note:  This document was prepared using Dragon voice recognition software and may include unintentional dictation errors.   Trinna Post, MD 02/19/23 [phone]

## 2023-02-18 NOTE — Telephone Encounter (Signed)
Pt is currently at the ED

## 2023-02-18 NOTE — Telephone Encounter (Signed)
Noted  

## 2023-02-18 NOTE — Discharge Instructions (Signed)
You were seen in the emergency room today for evaluation of your chest pain and elbow pain.  Regarding her chest pain, it fortunately your labs, EKG, and x-Doyne Micke were reassuring.  For your elbow pain, your ultrasound fortunately did not show signs of a DVT.  Your x-Deneene Tarver did not show any fractures, but it did show that you have some chronic changes in your connective tissues that I suspect may have become acutely inflamed causing your pain.  We provided you with a sling for comfort, but make sure you are ranging your elbow regularly.  You can also apply ice or heat to the area and try over-the-counter medicines to help with your discomfort.  I have included information for follow-up with orthopedics if your symptoms or not improving.  Return to the ER for any new or worsening symptoms.

## 2023-02-18 NOTE — Telephone Encounter (Signed)
Patient states yesterday she started having pain in her right arm between the bend and the elbow and there is a lump there.  Patient states she has pain shooting down her arm toward her hand.  Patient states it hurts to bend her arm.  Patient states her wrist looks more swollen than usual.  Patient states she regularly takes blood thinners (one in the morning and one at night).  Patient states she has a history of blood clots, DVT and PE.  Patient states she has had blood clots in both her arms and PE twice in her lungs.  Patient states she is wondering if she needs an ultrasound to verify whether or not it is a blood clot.  I transferred call to Access Nurse.

## 2023-02-19 ENCOUNTER — Other Ambulatory Visit: Payer: Self-pay | Admitting: Gastroenterology

## 2023-02-24 ENCOUNTER — Telehealth: Payer: Self-pay | Admitting: *Deleted

## 2023-02-24 NOTE — Transitions of Care (Post Inpatient/ED Visit) (Signed)
02/24/2023  Name: Valerie Vazquez MRN: [id number] DOB: [birth year]  Today's TOC FU Call Status: Today's TOC FU Call Status:: Successful TOC FU Call Competed TOC FU Call Complete Date: 02/24/23  Transition Care Management Follow-up Telephone Call Date of Discharge: 02/19/23 Discharge Facility: [facility]) Type of Discharge: Emergency Department Reason for ED Visit: Other: (elbow pain) How have you been since you were released from the hospital?: Better (still a little sore but I having a little pain inthe other elbow) Any questions or concerns?: No  Items Reviewed: Did you receive and understand the discharge instructions provided?: Yes Medications obtained,verified, and reconciled?: Yes (Medications Reviewed) Any new allergies since your discharge?: No Dietary orders reviewed?: No Do you have support at home?: Yes People in Home: alone Name of Support/Comfort Primary Source: Mellody Dance  Medications Reviewed Today: Medications Reviewed Today     Reviewed by Luella Cook, RN (Case Manager) on 02/24/23 at 1405  Med List Status: <None>   Medication Order Taking? Sig Documenting Provider Last Dose Status Informant  acetaminophen (TYLENOL) 500 MG tablet [id number] Yes Take 1 tablet (500 mg total) by mouth every 6 (six) hours as needed. Enid Derry, PA-C Taking Active   b complex vitamins capsule [id number]  Take 1 capsule by mouth daily.  Patient not taking: Reported on 02/09/2023   [provider]  Active Self  baclofen (LIORESAL) 10 MG tablet [id number] Yes TAKE ONE TABLET BY MOUTH THREE TIMES DAILY AS NEEDED FOR MUSCLE SPASMS (VIAL) Glori Luis, MD Taking Active   blood glucose meter kit and supplies KIT [id number]  Dispense based on patient and insurance preference. Use once daily fasting. Dx code E11.9. Glori Luis, MD  Active   Blood Glucose Monitoring Suppl (GHT BLOOD GLUCOSE MONITOR) w/Device KIT [id number]  DISPENSE BASED ON  PATIENT AND INSURANCE PREFERENCE. USE ONCE DAILY AS DIRECTED. DX CODE E11.9. Glori Luis, MD  Active   dicyclomine (BENTYL) 20 MG tablet [id number] Yes TAKE ONE TABLET BY MOUTH THREE TIMES DAILY AS NEEDED FOR ABDOMINAL SPASMS (VIAL) Glori Luis, MD Taking Active   diphenhydrAMINE (BENADRYL) 25 MG tablet [id number] Yes Take 1 tablet (25 mg total) by mouth every 6 (six) hours. Azalia Bilis, MD Taking Active   ELIQUIS 5 MG TABS tablet [id number] Yes TAKE ONE TABLET BY MOUTH TWICE DAILY @ 9AM-5PM (VIAL) Glori Luis, MD Taking Active   escitalopram (LEXAPRO) 20 MG tablet [id number] Yes TAKE ONE TABLET BY MOUTH DAILY AT Renette Butters, MD Taking Active   famotidine (PEPCID) 20 MG tablet [id number] Yes TAKE ONE TABLET BY MOUTH DAILY AT 9PM AT BEDTIME (VIAL) Glori Luis, MD Taking Active   fluticasone [facility]) 50 MCG/ACT nasal spray [id number] Yes Place 2 sprays into both nostrils daily. Allegra Grana, FNP Taking Active   furosemide (LASIX) 20 MG tablet [id number] Yes TAKE ONE TABLET BY MOUTH DAILY AS NEEDED FOR EDEMA (VIAL) Glori Luis, MD Taking Active   gabapentin (NEURONTIN) 600 MG tablet [id number] Yes TAKE ONE TABLET BY MOUTH DAILY AT 9AM and TAKE 1 AND 1/2 TABLETS DAILY AT 9PM AT BEDTIME Glori Luis, MD Taking Active   isosorbide mononitrate (IMDUR) 30 MG 24 hr tablet [id number] Yes TAKE ONE TABLET BY MOUTH DAILY AT 9AM (VIAL) Glori Luis, MD Taking Active   JARDIANCE 25 MG TABS tablet [id number] Yes TAKE ONE TABLET BY MOUTH DAILY AT 9AM (VIAL) Glori Luis, MD Taking Active   levothyroxine (SYNTHROID) 25  MCG tablet [id number] Yes TAKE ONE TABLET BY MOUTH DAILY AT 7AM BEFORE BREAKFAST AND 2-3 HOURS BEFORE TAKING ANY OTHER MEDICTION OR SUPPLEMENT Glori Luis, MD Taking Active   nitroGLYCERIN (NITROSTAT) 0.4 MG SL tablet [id number] Yes Place 1 tablet (0.4 mg total) under the tongue every 5 (five) minutes as needed for chest pain. Kathleene Hazel, MD Taking Active   OLANZapine (ZYPREXA) 10 MG tablet [id number] Yes TAKE 1 TABLET BY MOUTH EVERYDAY AT BEDTIME Glori Luis, MD Taking Active   omeprazole (PRILOSEC) 40 MG capsule [id number] Yes TAKE 1 CAPSULE (40 MG TOTAL) BY MOUTH DAILY. Wyline Mood, MD Taking Active   ondansetron (ZOFRAN-ODT) 4 MG disintegrating tablet [id number] Yes Take 1 tablet (4 mg total) by mouth every 6 (six) hours as needed for nausea. Conard Novak, MD Taking Active   OVER THE COUNTER MEDICATION [id number]  1,500 mg daily. Yellowdock Root  Patient not taking: Reported on 02/09/2023   [provider]  Active Self  OVER THE COUNTER MEDICATION [id number]  200 mg daily. Magnesium Citrate Gummy [provider]  Active Self  OVER THE COUNTER MEDICATION [id number]  Power C 2 gummies daily [provider]  Active Self           Med Note Carin Hock Feb 09, 2023 11:38 AM) Takes 1 gummy daily   oxyCODONE-acetaminophen (PERCOCET/ROXICET) 5-325 MG tablet [id number]  Take 1 tablet by mouth every 6 (six) hours as needed (breakthrough pain).  Patient not taking: Reported on 02/09/2023   Conard Novak, MD  Active   pantoprazole (PROTONIX) 40 MG tablet [id number] Yes TAKE ONE TABLET BY MOUTH DAILY AT Renette Butters, MD Taking Active   potassium chloride SA (KLOR-CON M) 20 MEQ tablet [id number] Yes TAKE ONE TABLET BY MOUTH DAILY AS NEEDED TAKE WITH LASIX (VIAL) Glori Luis, MD Taking Active   rosuvastatin (CRESTOR) 40 MG tablet [id number] Yes TAKE ONE TABLET BY MOUTH DAILY AT Bettey Costa, MD Taking Active   Semaglutide,0.25 or 0.5MG /DOS, (OZEMPIC, 0.25 OR 0.5 MG/DOSE,) 2 MG/3ML SOPN [id number] Yes Inject 0.5 mg into the skin once a week. Glori Luis, MD Taking Active   tobramycin-dexamethasone [facility]) ophthalmic solution [id number] Yes Place 2 drops into the left eye every 6 (six) hours. [provider] Taking Active   Vitamin A 2400  MCG (8000 UT) TABS [id number]  Take by mouth daily.  Patient not taking: Reported on 02/09/2023   [provider]  Active Self  vitamin E 180 MG (400 UNITS) capsule [id number]  Take 400 Units by mouth daily.  Patient not taking: Reported on 02/09/2023   [provider]  Active Self  vortioxetine HBr (TRINTELLIX) 20 MG TABS tablet [id number] Yes Take 20 mg by mouth daily. [provider] Taking Active   Zinc Sulfate (ZINC 15 PO) [id number] Yes Take by mouth daily. [provider] Taking Active Self  Med List Note Jearld Pies, RN 01/29/16 1550): New patient UDS: 01/29/16            Home Care and Equipment/Supplies: Were Home Health Services Ordered?: NA Any new equipment or medical supplies ordered?: NA  Functional Questionnaire: Do you need assistance with bathing/showering or dressing?: No Do you need assistance with meal preparation?: No Do you need assistance with eating?: No Do you have difficulty maintaining continence: No Do you need assistance with getting out of bed/getting out of a chair/moving?: No Do you have difficulty managing  or taking your medications?: No  Follow up appointments reviewed: PCP Follow-up appointment confirmed?: Yes Date of PCP follow-up appointment?: 03/13/23 Follow-up Provider: [facility] Follow-up appointment confirmed?: Yes Date of Specialist follow-up appointment?: 03/24/23 Follow-Up Specialty Provider:: [id number] Celso Amy, [id number] George Ina Care Coordinator Do you need transportation to your follow-up appointment?: No Do you understand care options if your condition(s) worsen?: Yes-patient verbalized understanding  SDOH Interventions Today    Flowsheet Row Most Recent Value  SDOH Interventions   Food Insecurity Interventions Intervention Not Indicated  Housing Interventions Intervention Not Indicated  Transportation Interventions Intervention Not Indicated       Interventions Today    Flowsheet Row Most Recent Value  General Interventions   General Interventions Discussed/Reviewed General Interventions Discussed, General Interventions Reviewed, Referral to Nurse  [facility] made Care Coordinator aware of ed visit]      TOC Interventions Today    Flowsheet Row Most Recent Value  TOC Interventions   TOC Interventions Discussed/Reviewed TOC Interventions Discussed, TOC Interventions Reviewed       Gean Maidens BSN RN [facility] [phone]

## 2023-03-01 DIAGNOSIS — E039 Hypothyroidism, unspecified: Secondary | ICD-10-CM | POA: Diagnosis not present

## 2023-03-01 DIAGNOSIS — E119 Type 2 diabetes mellitus without complications: Secondary | ICD-10-CM | POA: Diagnosis not present

## 2023-03-02 ENCOUNTER — Other Ambulatory Visit: Payer: Self-pay | Admitting: Family

## 2023-03-02 DIAGNOSIS — J341 Cyst and mucocele of nose and nasal sinus: Secondary | ICD-10-CM

## 2023-03-09 DIAGNOSIS — E119 Type 2 diabetes mellitus without complications: Secondary | ICD-10-CM | POA: Diagnosis not present

## 2023-03-09 DIAGNOSIS — I498 Other specified cardiac arrhythmias: Secondary | ICD-10-CM | POA: Diagnosis not present

## 2023-03-09 DIAGNOSIS — I499 Cardiac arrhythmia, unspecified: Secondary | ICD-10-CM | POA: Diagnosis not present

## 2023-03-09 DIAGNOSIS — E039 Hypothyroidism, unspecified: Secondary | ICD-10-CM | POA: Diagnosis not present

## 2023-03-13 ENCOUNTER — Encounter: Payer: Self-pay | Admitting: Family Medicine

## 2023-03-13 ENCOUNTER — Ambulatory Visit (INDEPENDENT_AMBULATORY_CARE_PROVIDER_SITE_OTHER): Payer: 59 | Admitting: Family Medicine

## 2023-03-13 VITALS — BP 118/72 | HR 83 | Temp 98.8°F | Ht 66.0 in | Wt 192.6 lb

## 2023-03-13 DIAGNOSIS — E119 Type 2 diabetes mellitus without complications: Secondary | ICD-10-CM | POA: Diagnosis not present

## 2023-03-13 DIAGNOSIS — E785 Hyperlipidemia, unspecified: Secondary | ICD-10-CM | POA: Diagnosis not present

## 2023-03-13 DIAGNOSIS — Z7985 Long-term (current) use of injectable non-insulin antidiabetic drugs: Secondary | ICD-10-CM

## 2023-03-13 DIAGNOSIS — M546 Pain in thoracic spine: Secondary | ICD-10-CM

## 2023-03-13 DIAGNOSIS — Z86711 Personal history of pulmonary embolism: Secondary | ICD-10-CM

## 2023-03-13 DIAGNOSIS — L600 Ingrowing nail: Secondary | ICD-10-CM

## 2023-03-13 DIAGNOSIS — Z7984 Long term (current) use of oral hypoglycemic drugs: Secondary | ICD-10-CM

## 2023-03-13 DIAGNOSIS — E039 Hypothyroidism, unspecified: Secondary | ICD-10-CM | POA: Diagnosis not present

## 2023-03-13 LAB — CBC
HCT: 37.8 % (ref 35.0–45.0)
Hemoglobin: 12.3 g/dL (ref 11.7–15.5)
MCH: 31.2 pg (ref 27.0–33.0)
MPV: 10.7 fL (ref 7.5–12.5)
RBC: 3.94 10*6/uL (ref 3.80–5.10)
RDW: 13 % (ref 11.0–15.0)
WBC: 8.1 10*3/uL (ref 3.8–10.8)

## 2023-03-13 NOTE — Patient Instructions (Signed)
Please let me know if you would like to do PT in a couple weeks.

## 2023-03-13 NOTE — Assessment & Plan Note (Signed)
Chronic issue.  Check A1c.  Continue Ozempic 0.5 mg weekly and Jardiance 25 mg daily.

## 2023-03-13 NOTE — Assessment & Plan Note (Signed)
Patient will call her podiatrist for follow-up.

## 2023-03-13 NOTE — Assessment & Plan Note (Signed)
Chronic issue.  Check TSH.  Continue Synthroid 25 mcg daily. 

## 2023-03-13 NOTE — Assessment & Plan Note (Signed)
Chronic issue. Patient will continue eliquis 5 mg BID. Check CBC.

## 2023-03-13 NOTE — Assessment & Plan Note (Signed)
Likely muscular strain.  Discussed use of Tylenol to help with discomfort.  Offered PT referral though we both opted to have her see how it goes over the next 2 weeks and if not improving by then we can do PT.

## 2023-03-13 NOTE — Progress Notes (Signed)
Valerie Alar, MD Phone: [phone]  Makensi Failing Charlson is a 67 y.o. female who presents today for follow-up.f/u.  DIABETES Disease Monitoring: Blood Sugar ranges-130s the past couple of days Polyuria/phagia/dipsia- no      Optho- UTD  Medications: Compliance- taking jardiance, ozempic Hypoglycemia- no  HYPOTHYROIDISM Disease Monitoring Weight changes: no  Skin Changes: notes some light and dark patches of skin, she saw dermatology of one of them and reports they gave her a cream for one of the spots Heat/Cold intolerance: notes heat intolerance  Medication Monitoring Compliance:  taking synthroid   Last TSH:   Lab Results  Component Value Date   TSH 1.16 04/11/2022   Upper back pain: Patient notes this has been going on 2 weeks.  She notes no injury.  Notes it hurts between her shoulder blades just like her low back is when her back hurts.  No radiation of the pain.  No numbness or weakness.  No incontinence.  She has used some Tylenol with some benefit.  History of PE: She continues on Eliquis.  No bleeding issues.  Ingrown toenails: She has seen podiatry in the past for ingrown great toenails.  She notes her second toenails are darting to have issues.  Social History   Tobacco Use  Smoking Status Former   Current packs/day: 0.00   Average packs/day: 1.5 packs/day for 10.0 years (15.0 ttl pk-yrs)   Types: Cigarettes   Start date: 04/11/1969   Quit date: 04/12/1979   Years since quitting: 43.9  Smokeless Tobacco Never    Current Outpatient Medications on File Prior to Visit  Medication Sig Dispense Refill   acetaminophen (TYLENOL) 500 MG tablet Take 1 tablet (500 mg total) by mouth every 6 (six) hours as needed. 30 tablet 0   b complex vitamins capsule Take 1 capsule by mouth daily.     baclofen (LIORESAL) 10 MG tablet TAKE ONE TABLET BY MOUTH THREE TIMES DAILY AS NEEDED FOR MUSCLE SPASMS (VIAL) 60 tablet 11   blood glucose meter kit and supplies KIT Dispense based on  patient and insurance preference. Use once daily fasting. Dx code E11.9. 1 each 0   Blood Glucose Monitoring Suppl (GHT BLOOD GLUCOSE MONITOR) w/Device KIT DISPENSE BASED ON PATIENT AND INSURANCE PREFERENCE. USE ONCE DAILY AS DIRECTED. DX CODE E11.9. 1 kit 0   dicyclomine (BENTYL) 20 MG tablet TAKE ONE TABLET BY MOUTH THREE TIMES DAILY AS NEEDED FOR ABDOMINAL SPASMS (VIAL) 270 tablet 3   ELIQUIS 5 MG TABS tablet TAKE ONE TABLET BY MOUTH TWICE DAILY @ 9AM-5PM (VIAL) 60 tablet 11   escitalopram (LEXAPRO) 20 MG tablet TAKE ONE TABLET BY MOUTH DAILY AT 9AM 90 tablet 0   famotidine (PEPCID) 20 MG tablet TAKE ONE TABLET BY MOUTH DAILY AT 9PM AT BEDTIME (VIAL) 90 tablet 11   fluticasone (FLONASE) 50 MCG/ACT nasal spray SPRAY 2 SPRAYS INTO EACH NOSTRIL EVERY DAY 48 mL 2   furosemide (LASIX) 20 MG tablet TAKE ONE TABLET BY MOUTH DAILY AS NEEDED FOR EDEMA (VIAL) 90 tablet 3   gabapentin (NEURONTIN) 600 MG tablet TAKE ONE TABLET BY MOUTH DAILY AT 9AM and TAKE 1 AND 1/2 TABLETS DAILY AT 9PM AT BEDTIME 225 tablet 11   isosorbide mononitrate (IMDUR) 30 MG 24 hr tablet TAKE ONE TABLET BY MOUTH DAILY AT 9AM (VIAL) (Patient taking differently: 15 mg. Patient takes 15 mg daily) 90 tablet 3   JARDIANCE 25 MG TABS tablet TAKE ONE TABLET BY MOUTH DAILY AT 9AM (VIAL) 90 tablet  3   levothyroxine (SYNTHROID) 25 MCG tablet TAKE ONE TABLET BY MOUTH DAILY AT 7AM BEFORE BREAKFAST AND 2-3 HOURS BEFORE TAKING ANY OTHER MEDICTION OR SUPPLEMENT 90 tablet 3   nitroGLYCERIN (NITROSTAT) 0.4 MG SL tablet Place 1 tablet (0.4 mg total) under the tongue every 5 (five) minutes as needed for chest pain. 25 tablet 3   OLANZapine (ZYPREXA) 10 MG tablet TAKE 1 TABLET BY MOUTH EVERYDAY AT BEDTIME 90 tablet 0   omeprazole (PRILOSEC) 40 MG capsule TAKE 1 CAPSULE (40 MG TOTAL) BY MOUTH DAILY. 90 capsule 0   ondansetron (ZOFRAN-ODT) 4 MG disintegrating tablet Take 1 tablet (4 mg total) by mouth every 6 (six) hours as needed for nausea. 20 tablet 0    OVER THE COUNTER MEDICATION 1,500 mg daily. Yellowdock Root     OVER THE COUNTER MEDICATION 200 mg daily. Magnesium Citrate Gummy     OVER THE COUNTER MEDICATION Power C 2 gummies daily     pantoprazole (PROTONIX) 40 MG tablet TAKE ONE TABLET BY MOUTH DAILY AT 9AM 72 tablet 11   potassium chloride SA (KLOR-CON M) 20 MEQ tablet TAKE ONE TABLET BY MOUTH DAILY AS NEEDED TAKE WITH LASIX (VIAL) 90 tablet 11   rosuvastatin (CRESTOR) 40 MG tablet TAKE ONE TABLET BY MOUTH DAILY AT 5PM 90 tablet 3   Semaglutide,0.25 or 0.5MG /DOS, (OZEMPIC, 0.25 OR 0.5 MG/DOSE,) 2 MG/3ML SOPN Inject 0.5 mg into the skin once a week. 3 mL 2   tobramycin-dexamethasone (TOBRADEX) ophthalmic solution Place 2 drops into the left eye every 6 (six) hours.     Vitamin A 2400 MCG (8000 UT) TABS Take by mouth daily.     vitamin E 180 MG (400 UNITS) capsule Take 400 Units by mouth daily.     vortioxetine HBr (TRINTELLIX) 20 MG TABS tablet Take 20 mg by mouth daily.     Zinc Sulfate (ZINC 15 PO) Take by mouth daily.     No current facility-administered medications on file prior to visit.     ROS see history of present illness  Objective  Physical Exam Vitals:   03/13/23 1404  BP: 118/72  Pulse: 83  Temp: 98.8 F (37.1 C)  SpO2: 96%    BP Readings from Last 3 Encounters:  03/13/23 118/72  02/18/23 (!) 174/90  02/12/23 96/62   Wt Readings from Last 3 Encounters:  03/13/23 192 lb 9.6 oz (87.4 kg)  02/18/23 196 lb (88.9 kg)  02/12/23 198 lb 9.6 oz (90.1 kg)    Physical Exam Constitutional:      General: She is not in acute distress.    Appearance: She is not diaphoretic.  Cardiovascular:     Rate and Rhythm: Normal rate and regular rhythm.     Heart sounds: Normal heart sounds.  Pulmonary:     Effort: Pulmonary effort is normal.     Breath sounds: Normal breath sounds.  Musculoskeletal:     Comments: No midline spine tenderness, no midline spine step-off, no muscular upper back tenderness  Skin:     General: Skin is warm and dry.  Neurological:     Mental Status: She is alert.    Diabetic Foot Exam - Simple   Simple Foot Form Diabetic Foot exam was performed with the following findings: Yes 03/13/2023  2:37 PM  Visual Inspection See comments: Yes Sensation Testing See comments: Yes Pulse Check Posterior Tibialis and Dorsalis pulse intact bilaterally: Yes Comments Reduced monofilament sensation bilaterally, possible mildly ingrown second toenails bilaterally, no other  deformities, ulcerations, or skin breakdown bilaterally, light touch sensation intact bilaterally      Assessment/Plan: Please see individual problem list.  Diabetes mellitus without complication (HCC) Assessment & Plan: Chronic issue.  Check A1c.  Continue Ozempic 0.5 mg weekly and Jardiance 25 mg daily.  Orders: -     Hemoglobin A1c -     Comprehensive metabolic panel  Hyperlipidemia, unspecified hyperlipidemia type -     Comprehensive metabolic panel -     Lipid panel  Hypothyroidism, unspecified type Assessment & Plan: Chronic issue.  Check TSH.  Continue Synthroid 25 mcg daily.  Orders: -     TSH  Acute bilateral thoracic back pain Assessment & Plan: Likely muscular strain.  Discussed use of Tylenol to help with discomfort.  Offered PT referral though we both opted to have her see how it goes over the next 2 weeks and if not improving by then we can do PT.   History of pulmonary embolus (PE) Assessment & Plan: Chronic issue. Patient will continue eliquis 5 mg BID. Check CBC.  Orders: -     CBC  Ingrown toenail Assessment & Plan: Patient will call her podiatrist for follow-up.      Return in about 3 months (around 06/13/2023).   Valerie Alar, MD [facility]

## 2023-03-14 LAB — COMPREHENSIVE METABOLIC PANEL
AG Ratio: 1.6 (calc) (ref 1.0–2.5)
ALT: 39 U/L — ABNORMAL HIGH (ref 6–29)
AST: 31 U/L (ref 10–35)
Albumin: 4 g/dL (ref 3.6–5.1)
Alkaline phosphatase (APISO): 93 U/L (ref 37–153)
BUN/Creatinine Ratio: 5 (calc) — ABNORMAL LOW (ref 6–22)
BUN: 5 mg/dL — ABNORMAL LOW (ref 7–25)
CO2: 25 mmol/L (ref 20–32)
Calcium: 9.4 mg/dL (ref 8.6–10.4)
Chloride: 111 mmol/L — ABNORMAL HIGH (ref 98–110)
Creat: 0.91 mg/dL (ref 0.50–1.05)
Globulin: 2.5 g/dL (calc) (ref 1.9–3.7)
Glucose, Bld: 109 mg/dL — ABNORMAL HIGH (ref 65–99)
Potassium: 4.1 mmol/L (ref 3.5–5.3)
Sodium: 145 mmol/L (ref 135–146)
Total Bilirubin: 0.4 mg/dL (ref 0.2–1.2)
Total Protein: 6.5 g/dL (ref 6.1–8.1)

## 2023-03-14 LAB — HEMOGLOBIN A1C
Hgb A1c MFr Bld: 6 % of total Hgb — ABNORMAL HIGH (ref <5.7)
Mean Plasma Glucose: 126 mg/dL
eAG (mmol/L): 7 mmol/L

## 2023-03-14 LAB — LIPID PANEL
Cholesterol: 127 mg/dL (ref <200)
HDL: 42 mg/dL — ABNORMAL LOW (ref >=50)
LDL Cholesterol (Calc): 68 mg/dL (calc)
Non-HDL Cholesterol (Calc): 85 mg/dL (calc) (ref <130)
Total CHOL/HDL Ratio: 3 (calc) (ref <5.0)
Triglycerides: 91 mg/dL (ref <150)

## 2023-03-14 LAB — CBC
MCHC: 32.5 g/dL (ref 32.0–36.0)
MCV: 95.9 fL (ref 80.0–100.0)
Platelets: 258 10*3/uL (ref 140–400)

## 2023-03-14 LAB — TSH: TSH: 1.27 mIU/L (ref 0.40–4.50)

## 2023-03-15 ENCOUNTER — Other Ambulatory Visit: Payer: Self-pay | Admitting: Family Medicine

## 2023-03-15 DIAGNOSIS — F411 Generalized anxiety disorder: Secondary | ICD-10-CM

## 2023-03-16 ENCOUNTER — Other Ambulatory Visit: Payer: Self-pay | Admitting: Family Medicine

## 2023-03-16 DIAGNOSIS — E119 Type 2 diabetes mellitus without complications: Secondary | ICD-10-CM

## 2023-03-17 ENCOUNTER — Ambulatory Visit: Payer: Self-pay

## 2023-03-17 ENCOUNTER — Telehealth: Payer: Self-pay

## 2023-03-17 NOTE — Telephone Encounter (Signed)
Left message to call the office back regarding lab results below. 

## 2023-03-17 NOTE — Telephone Encounter (Signed)
-----   Message from [name] sent at 03/16/2023  4:56 PM EDT ----- Please let the patient know that her A1c is well-controlled.  One of her liver enzymes is mildly elevated.  Has she been having any right upper quadrant abdominal pain?  Her cholesterol is well-controlled.  Her other lab work is acceptable.

## 2023-03-17 NOTE — Patient Outreach (Signed)
  Care Coordination   Follow Up Visit Note   03/17/2023 Name: Valerie Vazquez MRN: [id number] DOB: 1955/12/15  Valerie Vazquez is a 67 y.o. year old female who sees Birdie Sons, Yehuda Mao, MD for primary care. I spoke with  Valerie Vazquez by phone today.  What matters to the patients health and wellness today?  Patient reports recent ED visit on 02/18/23 for right elbow pain and swelling. She reports symptoms have since resolved.  Patient reports having follow up visit with her primary care provider on 03/13/23.   States fasting blood sugars have ranged from 72- 100.  Denies blood sugars <70.  Per chart review Hgb A1c results from 03/13/23 is 6.0.  Patient denies any further needs or concerns and is agreeable that care coordination goals have been met.    Goals Addressed             This Visit's Progress    COMPLETED: Patient stated:   diabetic education and assistance with management       Interventions Today    Flowsheet Row Most Recent Value  Chronic Disease   Chronic disease during today's visit Diabetes  General Interventions   General Interventions Discussed/Reviewed General Interventions Reviewed, Doctor Visits  [evaluation of current treatment plan for diabetes and patients adherence to plan as estabished by provider.  Assessed for BS readings. Assessed for ongoing right elbow symptoms.]  Doctor Visits Discussed/Reviewed Doctor Visits Reviewed  Education Interventions   Education Provided Provided Education  Provided Verbal Education On Blood Sugar Monitoring  [Discussed Rule of 15 for hypoglycemic treatment.]  Pharmacy Interventions   Pharmacy Dicussed/Reviewed Pharmacy Topics Reviewed  [medications reviewed and compliance discussed.]                  SDOH assessments and interventions completed:  No     Care Coordination Interventions:  Yes, provided   Follow up plan: No further intervention required.   Encounter Outcome:  Pt. Visit Completed   George Ina  RN,BSN,CCM [facility] Care Coordination [phone] direct line

## 2023-03-17 NOTE — Patient Instructions (Signed)
Visit Information  Thank you for taking time to visit with me today. Please don't hesitate to contact me if I can be of assistance to you.   Following are the goals we discussed today:   Goals Addressed             This Visit's Progress    Patient stated:   diabetic education and assistance with management       Interventions Today    Flowsheet Row Most Recent Value  Chronic Disease   Chronic disease during today's visit Diabetes  General Interventions   General Interventions Discussed/Reviewed General Interventions Reviewed, Doctor Visits  [evaluation of current treatment plan for diabetes and patients adherence to plan as estabished by provider.  Assessed for BS readings. Assessed for ongoing right elbow symptoms.]  Doctor Visits Discussed/Reviewed Doctor Visits Reviewed  Education Interventions   Education Provided Provided Education  Provided Verbal Education On Blood Sugar Monitoring  [Discussed Rule of 15 for hypoglycemic treatment.]  Pharmacy Interventions   Pharmacy Dicussed/Reviewed Pharmacy Topics Reviewed  [medications reviewed and compliance discussed.]                   If you are experiencing a Mental Health or Behavioral Health Crisis or need someone to talk to, please call the Suicide and Crisis Lifeline: 988 call 1-800-273-TALK (toll free, 24 hour hotline)  Patient verbalizes understanding of instructions and care plan provided today and agrees to view in MyChart. Active MyChart status and patient understanding of how to access instructions and care plan via MyChart confirmed with patient.     George Ina RN,BSN,CCM [facility] Care Coordination [phone] direct line

## 2023-03-18 ENCOUNTER — Encounter: Payer: Self-pay | Admitting: Gastroenterology

## 2023-03-18 DIAGNOSIS — N83201 Unspecified ovarian cyst, right side: Secondary | ICD-10-CM

## 2023-03-18 NOTE — Telephone Encounter (Signed)
Noted. Given that it is only mildly elevated I would suggest rechecking in 3-4 weeks to see if it goes back to the normal range. It could be high from her cholesterol medication, some other medication, or some other cause, though since it is only mildly elevated we do not need to do anything other than recheck at this point.

## 2023-03-18 NOTE — Telephone Encounter (Signed)
Pt returned [facility] CMA call. Note below was read to her. Pt aware and understood. As per pt, she does not have ay quadrant abd pain. Pt stated what's next from now on??

## 2023-03-19 DIAGNOSIS — E119 Type 2 diabetes mellitus without complications: Secondary | ICD-10-CM | POA: Diagnosis not present

## 2023-03-19 DIAGNOSIS — E039 Hypothyroidism, unspecified: Secondary | ICD-10-CM | POA: Diagnosis not present

## 2023-03-19 NOTE — Telephone Encounter (Signed)
Patient is scheduled for lab appointment on 04/17/23.

## 2023-03-23 ENCOUNTER — Other Ambulatory Visit: Payer: Self-pay | Admitting: Gastroenterology

## 2023-03-23 MED ORDER — ONDANSETRON 4 MG PO TBDP
4.0000 mg | ORAL_TABLET | Freq: Four times a day (QID) | ORAL | 0 refills | Status: DC | PRN
Start: 2023-03-23 — End: 2023-05-07

## 2023-03-23 MED ORDER — OMEPRAZOLE 40 MG PO CPDR: 40.0000 mg | DELAYED_RELEASE_CAPSULE | Freq: Every day | ORAL | 0 refills | Status: DC

## 2023-03-23 MED ORDER — DICYCLOMINE HCL 20 MG PO TABS: ORAL_TABLET | ORAL | 3 refills | Status: DC

## 2023-03-24 ENCOUNTER — Ambulatory Visit: Payer: 59 | Admitting: Physician Assistant

## 2023-03-25 ENCOUNTER — Telehealth: Payer: Self-pay | Admitting: Family Medicine

## 2023-03-25 DIAGNOSIS — R1011 Right upper quadrant pain: Secondary | ICD-10-CM

## 2023-03-25 NOTE — Telephone Encounter (Signed)
Patient does need an [place]. In the result notes she reported RUQ abdominal pain. I am placing the order. Please let her know.

## 2023-03-25 NOTE — Telephone Encounter (Signed)
Called Patient to verify that there is no liver scan for now. We are rechecking her labs on 04/17/23. Patient did state she is having low right side quadrant pain that wraps around to her back.

## 2023-03-25 NOTE — Telephone Encounter (Signed)
Pt called in stating that someone called her from here and told her that she's having a liver scan but she's confused where she has to go. Please advice.

## 2023-03-26 NOTE — Telephone Encounter (Signed)
Called Patient to let her know that Dr. Birdie Sons did order an ultrasound for the RUQ pain and someone should contact her to schedule. Patient is aware if she does not hear in the next couple of weeks to let us know.

## 2023-04-01 DIAGNOSIS — E119 Type 2 diabetes mellitus without complications: Secondary | ICD-10-CM | POA: Diagnosis not present

## 2023-04-01 DIAGNOSIS — E039 Hypothyroidism, unspecified: Secondary | ICD-10-CM | POA: Diagnosis not present

## 2023-04-02 ENCOUNTER — Ambulatory Visit
Admission: RE | Admit: 2023-04-02 | Discharge: 2023-04-02 | Disposition: A | Discharge status: Home / Self Care | Payer: 59 | Source: Ambulatory Visit | Attending: Family Medicine | Admitting: Family Medicine

## 2023-04-02 DIAGNOSIS — R109 Unspecified abdominal pain: Secondary | ICD-10-CM | POA: Diagnosis not present

## 2023-04-02 DIAGNOSIS — R1011 Right upper quadrant pain: Secondary | ICD-10-CM | POA: Insufficient documentation

## 2023-04-02 DIAGNOSIS — R748 Abnormal levels of other serum enzymes: Secondary | ICD-10-CM | POA: Diagnosis not present

## 2023-04-02 DIAGNOSIS — Z9049 Acquired absence of other specified parts of digestive tract: Secondary | ICD-10-CM | POA: Diagnosis not present

## 2023-04-07 ENCOUNTER — Telehealth: Payer: Self-pay | Admitting: *Deleted

## 2023-04-07 DIAGNOSIS — R7401 Elevation of levels of liver transaminase levels: Secondary | ICD-10-CM

## 2023-04-07 NOTE — Telephone Encounter (Signed)
Left voicemail to return call for results & lab appt (see message below)

## 2023-04-07 NOTE — Telephone Encounter (Signed)
-----   Message from [name] sent at 04/07/2023  1:07 PM EDT ----- Please let the patient know that her ultrasound did not reveal a cause for her abnormal liver enzymes or her abdominal discomfort.  Please see if she is still having abdominal discomfort.  We should plan on rechecking a hepatic function panel in the next week or 2 to see if her ALT is still elevated.

## 2023-04-07 NOTE — Telephone Encounter (Signed)
Patient called and note from Dr Birdie Sons was read. Patient is feeling better. Lab has been scheduled for 04/17/2023.

## 2023-04-08 ENCOUNTER — Other Ambulatory Visit: Payer: Self-pay

## 2023-04-08 ENCOUNTER — Telehealth: Payer: Self-pay | Admitting: Family Medicine

## 2023-04-08 DIAGNOSIS — E039 Hypothyroidism, unspecified: Secondary | ICD-10-CM | POA: Diagnosis not present

## 2023-04-08 DIAGNOSIS — E119 Type 2 diabetes mellitus without complications: Secondary | ICD-10-CM | POA: Diagnosis not present

## 2023-04-08 MED ORDER — EMPAGLIFLOZIN 25 MG PO TABS: 25.0000 mg | ORAL_TABLET | Freq: Every day | ORAL | 3 refills | Status: DC

## 2023-04-08 NOTE — Telephone Encounter (Signed)
Prescription sent to Pharmacy.  

## 2023-04-08 NOTE — Telephone Encounter (Signed)
Prescription Request  04/08/2023  LOV: 03/13/2023  What is the name of the medication or equipment? [email or web address]  Have you contacted your pharmacy to request a refill? No   Which pharmacy would you like this sent to? [facility] in [place]   Patient notified that their request is being sent to the clinical staff for review and that they should receive a response within 2 business days.   Please advise at Mobile [phone] (mobile)

## 2023-04-08 NOTE — Telephone Encounter (Signed)
She just needs the hepatic function panel.

## 2023-04-08 NOTE — Telephone Encounter (Signed)
Patient need lab orders, note states to check cholesterol and only Hepatic in future orders. I just wanted to double check.

## 2023-04-09 ENCOUNTER — Ambulatory Visit: Payer: 59 | Admitting: Cardiology

## 2023-04-09 DIAGNOSIS — N362 Urethral caruncle: Secondary | ICD-10-CM | POA: Diagnosis not present

## 2023-04-10 ENCOUNTER — Encounter: Payer: Self-pay | Admitting: Cardiology

## 2023-04-10 ENCOUNTER — Ambulatory Visit: Payer: 59 | Attending: Cardiology | Admitting: Cardiology

## 2023-04-10 VITALS — BP 110/72 | HR 88 | Ht 66.0 in | Wt 188.4 lb

## 2023-04-10 DIAGNOSIS — I201 Angina pectoris with documented spasm: Secondary | ICD-10-CM

## 2023-04-10 DIAGNOSIS — I499 Cardiac arrhythmia, unspecified: Secondary | ICD-10-CM | POA: Diagnosis not present

## 2023-04-10 DIAGNOSIS — E7800 Pure hypercholesterolemia, unspecified: Secondary | ICD-10-CM

## 2023-04-10 DIAGNOSIS — I10 Essential (primary) hypertension: Secondary | ICD-10-CM | POA: Diagnosis not present

## 2023-04-10 MED ORDER — ISOSORBIDE MONONITRATE ER 30 MG PO TB24
15.0000 mg | ORAL_TABLET | Freq: Every day | ORAL | Status: DC
Start: 2023-04-10 — End: 2024-03-28

## 2023-04-10 NOTE — Patient Instructions (Signed)
Medication Instructions:   Your physician recommends that you continue on your current medications as directed. Please refer to the Current Medication list given to you today.  *If you need a refill on your cardiac medications before your next appointment, please call your pharmacy*   Lab Work:  None Ordered  If you have labs (blood work) drawn today and your tests are completely normal, you will receive your results only by: MyChart Message (if you have MyChart) OR A paper copy in the mail If you have any lab test that is abnormal or we need to change your treatment, we will call you to review the results.   Testing/Procedures:  None Ordered    Follow-Up: At [facility], you and your health needs are our priority.  As part of our continuing mission to provide you with exceptional heart care, we have created designated Provider Care Teams.  These Care Teams include your primary Cardiologist (physician) and Advanced Practice Providers (APPs -  Physician Assistants and Nurse Practitioners) who all work together to provide you with the care you need, when you need it.  We recommend signing up for the patient portal called "MyChart".  Sign up information is provided on this After Visit Summary.  MyChart is used to connect with patients for Virtual Visits (Telemedicine).  Patients are able to view lab/test results, encounter notes, upcoming appointments, etc.  Non-urgent messages can be sent to your provider as well.   To learn more about what you can do with MyChart, go to [email or web address]://www.mychart.com.    Your next appointment:   12 month(s)  Provider:   You may see Brian Agbor-Etang, MD or one of the following Advanced Practice Providers on your designated Care Team:   Christopher Berge, NP Ryan Dunn, PA-C Cadence Furth, PA-C Sheri Hammock, NP  

## 2023-04-10 NOTE — Progress Notes (Signed)
Cardiology Office Note:    Date:  04/10/2023   ID:  RUKMINI ROBUCK, DOB 1955/11/11, MRN [id number]  PCP:  Glori Luis, MD   [facility]  Cardiologist:  Debbe Odea, MD  Advanced Practice Provider:  No care team member to display Electrophysiologist:  None       Referring MD: Glori Luis, MD   Chief Complaint  Patient presents with   Follow-up    Patient denies new or acute cardiac problems/concerns today.      History of Present Illness:    Valerie Vazquez is a 67 y.o. female with a hx of Schizoaffective disorder, diabetes, coronary vasospasm,  hypertension, history of DVT on Eliquis who presents for follow-up.    Last seen with symptoms of irregular heartbeats, cardiac monitor placed to evaluate any significant arrhythmias.  Blood pressures were also running low at the time, Imdur was reduced to 50 mg daily.  BP improved.  Feels well, denies chest pain, dizziness.  Has no concerns at this time.  Prior notes  Lexiscan 07/2020 low risk study, no evidence for ischemia, EF 55 to 65% Echocardiogram 03/2019, normal systolic function, EF 60 to 65%, impaired relaxation.  Past Medical History:  Diagnosis Date   Acute right-sided low back pain with right-sided sciatica 10/23/2017   Anemia    Anginal pain (HCC)    Bilateral ovarian cysts    Bilateral renal cysts    Bulimia nervosa 05/07/2021   CAD (coronary artery disease) 09/28/2016   Chest pain 10/14/2015   Chronic back pain    "upper and lower" (09/19/2014)   Chronic shoulder pain (Location of Primary Source of Pain) (Left) 01/29/2016   Colitis 04/08/2016   Colon polyp 01/30/2017   Coronary vasospasm (HCC) 12/09/2014   DDD (degenerative disc disease), cervical    a.) s/p ACDF C5-C7   Depression    DVT (deep venous thrombosis) (HCC)    Esophageal spasm    Essential hypertension 10/28/2013   Gallstones    Gastritis    Gastroenteritis    GERD (gastroesophageal reflux disease)     Heart disease 12/09/2016   High blood pressure    High cholesterol    History of blood transfusion 1985   related to hysterectomy   History of left heart catheterization (LHC) 12/09/2014   a.) LHC 12/09/2014: EF 50%; normal coronaries.   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 Rule out Paget's disease of the bone with blood work.    Hyperlipidemia 10/28/2013   Hypertension    Hypothyroidism    IBS (irritable bowel syndrome)    Long term current use of anticoagulant    a.) apixaban   Lumbar central spinal stenosis (moderate at L4-5; mild at L2-3 and L3-4) 01/29/2016   Migraine    "a few times/yr" (09/19/2014)   Myocardial infarction (HCC) 10/2010 X 3   "while hospitalized"   Neck pain 01/29/2016   Osteoarthritis    Pleural effusion, bilateral 07/31/2015   Pneumonia 04/2014   PONV (postoperative nausea and vomiting)    PTSD (post-traumatic stress disorder)    Pulmonary embolism (HCC) 04/2001; 09/19/2014   "after gallbladder OR; "   Schizoaffective disorder, depressive type (HCC) 08/28/2016   Sleep apnea    "mild" (09/19/2014)   Snoring  a. sleep study 5/16: No OSA   SOB (shortness of breath) 07/31/2015   Spinal stenosis    Spondylosis    Suicidal ideation 08/27/2016   a.) seen in ED with approx 6 months of reported SI. Reported ingestion of "unknown pills" to end life; last ingestion was 3 days PTA; UDS (+) for TCAs. SI related to depression, friend (loss of friend), and social determinants   Type II diabetes mellitus (HCC)    "dx'd in 2007; lost weight; no RX for ~ 4 yr now" (09/19/2014)    Past Surgical History:  Procedure Laterality Date   ABDOMINAL HYSTERECTOMY  1985   ANTERIOR CERVICAL DECOMP/DISCECTOMY FUSION  10/2012   in [place] C5-C7    APPENDECTOMY  1985   BACK SURGERY      BREAST CYST EXCISION Right    BREAST EXCISIONAL BIOPSY Right YRS AGO   NEG   CARDIAC CATHETERIZATION   2000's; 2009   CHOLECYSTECTOMY  2002   COLONOSCOPY WITH PROPOFOL N/A 12/12/2016   Procedure: COLONOSCOPY WITH PROPOFOL;  Surgeon: Wyline Mood, MD;  Location: [facility] ENDOSCOPY;  Service: Endoscopy;  Laterality: N/A;   COLONOSCOPY WITH PROPOFOL N/A 02/17/2017   Procedure: COLONOSCOPY WITH PROPOFOL;  Surgeon: Wyline Mood, MD;  Location: [facility] ENDOSCOPY;  Service: Gastroenterology;  Laterality: N/A;   COLONOSCOPY WITH PROPOFOL N/A 11/04/2021   Procedure: COLONOSCOPY WITH PROPOFOL;  Surgeon: Wyline Mood, MD;  Location: [facility] ENDOSCOPY;  Service: Gastroenterology;  Laterality: N/A;   CYSTOSCOPY N/A 05/15/2022   Procedure: CYSTOSCOPY;  Surgeon: Conard Novak, MD;  Location: [facility] ORS;  Service: Gynecology;  Laterality: N/A;   ESOPHAGOGASTRODUODENOSCOPY (EGD) WITH PROPOFOL N/A 02/17/2017   Procedure: ESOPHAGOGASTRODUODENOSCOPY (EGD) WITH PROPOFOL;  Surgeon: Wyline Mood, MD;  Location: [facility] ENDOSCOPY;  Service: Gastroenterology;  Laterality: N/A;   ESOPHAGOGASTRODUODENOSCOPY (EGD) WITH PROPOFOL N/A 03/07/2019   Procedure: ESOPHAGOGASTRODUODENOSCOPY (EGD) WITH PROPOFOL;  Surgeon: Wyline Mood, MD;  Location: [facility] ENDOSCOPY;  Service: Gastroenterology;  Laterality: N/A;   EXCISION/RELEASE BURSA HIP Right 12/1984   HERNIA REPAIR  1985   LEFT HEART CATHETERIZATION WITH CORONARY ANGIOGRAM N/A 12/09/2014   Procedure: LEFT HEART CATHETERIZATION WITH CORONARY ANGIOGRAM;  Surgeon: Kathleene Hazel, MD;  Location: [facility] CATH LAB;  Service: Cardiovascular;  Laterality: N/A;   ROBOTIC ASSISTED BILATERAL SALPINGO OOPHERECTOMY Bilateral 05/15/2022   Procedure: XI ROBOTIC ASSISTED BILATERAL OOPHORECTOMY;  Surgeon: Conard Novak, MD;  Location: [facility] ORS;  Service: Gynecology;  Laterality: Bilateral;   TONSILLECTOMY AND ADENOIDECTOMY  ~ 1968   TOTAL HIP ARTHROPLASTY Left 04-06-2014   UMBILICAL HERNIA REPAIR  1985    VENA CAVA FILTER PLACEMENT  03/2014    Current Medications: Current Meds  Medication Sig   acetaminophen (TYLENOL) 500 MG tablet Take 1 tablet (500 mg total) by mouth every 6 (six) hours as needed.   b complex vitamins capsule Take 1 capsule by mouth daily.   baclofen (LIORESAL) 10 MG tablet TAKE ONE TABLET BY MOUTH THREE TIMES DAILY AS NEEDED FOR MUSCLE SPASMS (VIAL)   blood glucose meter kit and supplies KIT Dispense based on patient and insurance preference. Use once daily fasting. Dx code E11.9.   Blood Glucose Monitoring Suppl (GHT BLOOD GLUCOSE MONITOR) w/Device KIT DISPENSE BASED ON PATIENT AND INSURANCE PREFERENCE. USE ONCE DAILY AS DIRECTED. DX CODE E11.9.   dicyclomine (BENTYL) 20 MG tablet TAKE ONE TABLET BY MOUTH THREE TIMES DAILY AS NEEDED FOR ABDOMINAL SPASMS   ELIQUIS 5 MG TABS tablet TAKE ONE TABLET BY MOUTH TWICE DAILY @ 9AM-5PM (VIAL)  empagliflozin (JARDIANCE) 25 MG TABS tablet Take 1 tablet (25 mg total) by mouth daily.   escitalopram (LEXAPRO) 20 MG tablet TAKE ONE TABLET BY MOUTH DAILY AT 9AM   famotidine (PEPCID) 20 MG tablet TAKE ONE TABLET BY MOUTH DAILY AT 9PM AT BEDTIME (VIAL)   fluticasone (FLONASE) 50 MCG/ACT nasal spray SPRAY 2 SPRAYS INTO EACH NOSTRIL EVERY DAY   furosemide (LASIX) 20 MG tablet TAKE ONE TABLET BY MOUTH DAILY AS NEEDED FOR EDEMA (VIAL)   gabapentin (NEURONTIN) 600 MG tablet TAKE ONE TABLET BY MOUTH DAILY AT 9AM and TAKE 1 AND 1/2 TABLETS BY MOUTH DAILY AT 9PM (VIAL)   levothyroxine (SYNTHROID) 25 MCG tablet TAKE ONE TABLET BY MOUTH DAILY AT 7AM BEFORE BREAKFAST AND 2-3 HOURS BEFORE TAKING ANY OTHER MEDICTION OR SUPPLEMENT   nitroGLYCERIN (NITROSTAT) 0.4 MG SL tablet Place 1 tablet (0.4 mg total) under the tongue every 5 (five) minutes as needed for chest pain.   OLANZapine (ZYPREXA) 10 MG tablet TAKE 1 TABLET BY MOUTH EVERYDAY AT BEDTIME   omeprazole (PRILOSEC) 40 MG capsule Take 1 capsule (40 mg total) by mouth daily.   ondansetron (ZOFRAN-ODT) 4  MG disintegrating tablet Take 1 tablet (4 mg total) by mouth every 6 (six) hours as needed for nausea.   OVER THE COUNTER MEDICATION 1,500 mg daily. Yellowdock Root   OVER THE COUNTER MEDICATION 200 mg daily. Magnesium Citrate Gummy   OVER THE COUNTER MEDICATION Power C 2 gummies daily   pantoprazole (PROTONIX) 40 MG tablet TAKE ONE TABLET BY MOUTH DAILY AT 9AM (VIAL)   potassium chloride SA (KLOR-CON M) 20 MEQ tablet TAKE ONE TABLET BY MOUTH DAILY AS NEEDED TAKE WITH LASIX (VIAL)   rosuvastatin (CRESTOR) 40 MG tablet TAKE ONE TABLET BY MOUTH DAILY AT 5PM   Semaglutide,0.25 or 0.5MG /DOS, (OZEMPIC, 0.25 OR 0.5 MG/DOSE,) 2 MG/3ML SOPN INJECT 0.5 MG INTO THE SKIN ONE TIME PER WEEK   tobramycin-dexamethasone (TOBRADEX) ophthalmic solution Place 2 drops into the left eye every 6 (six) hours.   Vitamin A 2400 MCG (8000 UT) TABS Take by mouth daily.   vitamin E 180 MG (400 UNITS) capsule Take 400 Units by mouth daily.   vortioxetine HBr (TRINTELLIX) 20 MG TABS tablet Take 20 mg by mouth daily.   Zinc Sulfate (ZINC 15 PO) Take by mouth daily.   [DISCONTINUED] isosorbide mononitrate (IMDUR) 30 MG 24 hr tablet TAKE ONE TABLET BY MOUTH DAILY AT 9AM (VIAL) (Patient taking differently: 15 mg. Patient takes 15 mg daily)     Allergies:   Abilify [aripiprazole], Augmentin [amoxicillin-pot clavulanate], Bactrim [sulfamethoxazole-trimethoprim], Ceftin [cefuroxime axetil], Coconut (cocos nucifera), Diclofenac, Dye fdc red [red dye], Indocin [indomethacin], Linaclotide, Lisinopril, Morphine, Morphine and codeine, Simvastatin, Sulfa antibiotics, Sulfamethoxazole-trimethoprim, Wellbutrin [bupropion], Zetia [ezetimibe], Quetiapine fumarate, and Quetiapine fumarate   Social History   Socioeconomic History   Marital status: Single    Spouse name: Not on file   Number of children: Not on file   Years of education: Not on file   Highest education level: Not on file  Occupational History   Not on file  Tobacco Use    Smoking status: Former    Current packs/day: 0.00    Average packs/day: 1.5 packs/day for 10.0 years (15.0 ttl pk-yrs)    Types: Cigarettes    Start date: 04/11/1969    Quit date: 04/12/1979    Years since quitting: 44.0   Smokeless tobacco: Never  Vaping Use   Vaping status: Never Used  Substance and Sexual Activity  Alcohol use: No    Alcohol/week: 0.0 standard drinks of alcohol   Drug use: No   Sexual activity: Not Currently  Other Topics Concern   Not on file  Social History Narrative   Lives with fiancee in an apartment on the first floor.  Has 3 grown children.  One child passed away.  On disability 13-May-1995 - 2000, 2007 - current.     Education: some college.   Social Determinants of Health   Financial Resource Strain: Low Risk  (02/09/2023)   Overall Financial Resource Strain (CARDIA)    Difficulty of Paying Living Expenses: Not very hard  Food Insecurity: No Food Insecurity (02/24/2023)   Hunger Vital Sign    Worried About Running Out of Food in the Last Year: Never true    Ran Out of Food in the Last Year: Never true  Transportation Needs: No Transportation Needs (02/24/2023)   PRAPARE - Administrator, Civil Service (Medical): No    Lack of Transportation (Non-Medical): No  Physical Activity: Inactive (02/09/2023)   Exercise Vital Sign    Days of Exercise per Week: 0 days    Minutes of Exercise per Session: 0 min  Stress: No Stress Concern Present (02/09/2023)   [employer] of Occupational Health - Occupational Stress Questionnaire    Feeling of Stress : Not at all  Social Connections: Moderately Isolated (02/09/2023)   Social Connection and Isolation Panel [NHANES]    Frequency of Communication with Friends and Family: More than three times a week    Frequency of Social Gatherings with Friends and Family: More than three times a week    Attends Religious Services: More than 4 times per year    Active Member of [employer] or Organizations: No    Attends  Banker Meetings: Never    Marital Status: Divorced     Family History: The patient's family history includes Cancer in her brother; Healthy in her daughter and son; Lung cancer in her father; Other in her son; Stroke in her mother. There is no history of Heart attack.  ROS:   Please see the history of present illness.     All other systems reviewed and are negative.  EKGs/Labs/Other Studies Reviewed:    The following studies were reviewed today:   EKG:  EKG not ordered today.  Recent Labs: 03/13/2023: ALT 39; BUN 5; Creat 0.91; Hemoglobin 12.3; Platelets 258; Potassium 4.1; Sodium 145; TSH 1.27  Recent Lipid Panel    Component Value Date/Time   CHOL 127 03/13/2023 1443   TRIG 91 03/13/2023 1443   HDL 42 (L) 03/13/2023 1443   CHOLHDL 3.0 03/13/2023 1443   VLDL 21.0 11/27/2021 1032   LDLCALC 68 03/13/2023 1443   LDLDIRECT 108.0 03/17/2022 1422     Risk Assessment/Calculations:      Physical Exam:    VS:  BP 110/72 (BP Location: Left Arm, Patient Position: Sitting, Cuff Size: Normal)   Pulse 88   Ht 5\' 6"  (1.676 m)   Wt 188 lb 6.4 oz (85.5 kg)   SpO2 95%   BMI 30.41 kg/m     Wt Readings from Last 3 Encounters:  04/10/23 188 lb 6.4 oz (85.5 kg)  03/13/23 192 lb 9.6 oz (87.4 kg)  02/18/23 196 lb (88.9 kg)     GEN:  Well nourished, well developed in no acute distress HEENT: Normal NECK: No JVD; No carotid bruits CARDIAC: RRR, no murmurs, rubs, gallops RESPIRATORY:  Clear to auscultation without  rales, wheezing or rhonchi  ABDOMEN: Soft, non-tender, non-distended MUSCULOSKELETAL:  trace edema; No deformity  SKIN: Warm and dry NEUROLOGIC:  Alert and oriented x 3 PSYCHIATRIC:  Normal affect   ASSESSMENT:    1. Irregular heart beat   2. Primary hypertension   3. Pure hypercholesterolemia   4. Coronary vasospasm (HCC)   5. Essential hypertension     PLAN:    In order of problems listed above:  Irregular heartbeat, cardiac monitor showed 1  episode of SVT lasting 4 beats.  No significant sustained arrhythmias.  Patient triggered events associated with sinus rhythm.  Patient made aware of results, reassured. Hypertension, BP controlled.  Continue Imdur 15 mg daily.  Continue Lasix as needed. Hyperlipidemia, continue Crestor 40 mg daily. Coronary artery calcification on chest CT.  History of coronary vasospasm.  Continue Crestor, Imdur 50 mg daily.  No anginal symptoms.  Patient on Eliquis due to history of DVT  Follow-up in 1 year.   Medication Adjustments/Labs and Tests Ordered: Current medicines are reviewed at length with the patient today.  Concerns regarding medicines are outlined above.  No orders of the defined types were placed in this encounter.   Meds ordered this encounter  Medications   isosorbide mononitrate (IMDUR) 30 MG 24 hr tablet    Sig: Take 0.5 tablets (15 mg total) by mouth daily.     Patient Instructions  Medication Instructions:   Your physician recommends that you continue on your current medications as directed. Please refer to the Current Medication list given to you today.  *If you need a refill on your cardiac medications before your next appointment, please call your pharmacy*   Lab Work:  None Ordered  If you have labs (blood work) drawn today and your tests are completely normal, you will receive your results only by: MyChart Message (if you have MyChart) OR A paper copy in the mail If you have any lab test that is abnormal or we need to change your treatment, we will call you to review the results.   Testing/Procedures:  None Ordered   Follow-Up: At [facility], you and your health needs are our priority.  As part of our continuing mission to provide you with exceptional heart care, we have created designated Provider Care Teams.  These Care Teams include your primary Cardiologist (physician) and Advanced Practice Providers (APPs -  Physician Assistants and Nurse  Practitioners) who all work together to provide you with the care you need, when you need it.  We recommend signing up for the patient portal called "MyChart".  Sign up information is provided on this After Visit Summary.  MyChart is used to connect with patients for Virtual Visits (Telemedicine).  Patients are able to view lab/test results, encounter notes, upcoming appointments, etc.  Non-urgent messages can be sent to your provider as well.   To learn more about what you can do with MyChart, go to [email or web address].    Your next appointment:   12 month(s)  Provider:   You may see Debbe Odea, MD or one of the following Advanced Practice Providers on your designated Care Team:   Nicolasa Ducking, NP Eula Listen, PA-C Cadence Fransico Michael, PA-C Charlsie Quest, NP   Signed, Debbe Odea, MD  04/10/2023 6:07 PM    [facility]

## 2023-04-16 ENCOUNTER — Other Ambulatory Visit: Payer: Self-pay

## 2023-04-16 ENCOUNTER — Telehealth: Payer: Self-pay | Admitting: Family Medicine

## 2023-04-16 MED ORDER — PANTOPRAZOLE SODIUM 40 MG PO TBEC: 40.0000 mg | DELAYED_RELEASE_TABLET | Freq: Every day | ORAL | 1 refills | Status: DC

## 2023-04-16 NOTE — Telephone Encounter (Signed)
Tara from [facility] called about pantoprazole medication for the pt and about the quaintly of the medication

## 2023-04-16 NOTE — Telephone Encounter (Signed)
Spoke with pharmacy new script was sent for 90 days

## 2023-04-17 ENCOUNTER — Other Ambulatory Visit: Payer: 59

## 2023-04-22 ENCOUNTER — Other Ambulatory Visit (INDEPENDENT_AMBULATORY_CARE_PROVIDER_SITE_OTHER): Payer: 59

## 2023-04-22 DIAGNOSIS — R7401 Elevation of levels of liver transaminase levels: Secondary | ICD-10-CM | POA: Diagnosis not present

## 2023-04-22 LAB — HEPATIC FUNCTION PANEL
ALT: 33 U/L (ref 0–35)
AST: 25 U/L (ref 0–37)
Albumin: 3.8 g/dL (ref 3.5–5.2)
Alkaline Phosphatase: 109 U/L (ref 39–117)
Bilirubin, Direct: 0.1 mg/dL (ref 0.0–0.3)
Total Bilirubin: 0.4 mg/dL (ref 0.2–1.2)
Total Protein: 6.6 g/dL (ref 6.0–8.3)

## 2023-04-27 ENCOUNTER — Ambulatory Visit (INDEPENDENT_AMBULATORY_CARE_PROVIDER_SITE_OTHER): Payer: 59 | Admitting: Urology

## 2023-04-27 VITALS — BP 84/56 | HR 120 | Ht 66.0 in | Wt 188.4 lb

## 2023-04-27 DIAGNOSIS — N368 Other specified disorders of urethra: Secondary | ICD-10-CM | POA: Diagnosis not present

## 2023-04-27 DIAGNOSIS — N362 Urethral caruncle: Secondary | ICD-10-CM | POA: Diagnosis not present

## 2023-04-27 LAB — MICROSCOPIC EXAMINATION: Epithelial Cells (non renal): >10 /hpf — AB (ref 0–10)

## 2023-04-27 LAB — URINALYSIS, COMPLETE
Bilirubin, UA: NEGATIVE
Ketones, UA: NEGATIVE
Leukocytes,UA: NEGATIVE
Nitrite, UA: NEGATIVE
Protein,UA: NEGATIVE
Specific Gravity, UA: 1.015 (ref 1.005–1.030)
Urobilinogen, Ur: 1 mg/dL (ref 0.2–1.0)
pH, UA: 5.5 (ref 5.0–7.5)

## 2023-04-27 NOTE — Progress Notes (Signed)
04/27/2023 9:16 AM   Valerie Vazquez 09-Jul-1956 [id number]  Referring provider: Conard Novak, MD [address] [place],  [place] [address]  Chief Complaint  Patient presents with   Follow-up    HPI: Dr Matilde Sprang: Patient had gross hematuria with minimal dysuria.  CT scan normal.  Hematuria has settled down.  Patient noted to have 1 cm mobile nontender lesion inferior aspect of the urethra and I reviewed the photo   Day Frequency stable.  No blood in urine subjectively.  No burning. No recent urine culture.  Hematuria CT scan was within normal limit but had streak artifact from left hip prosthesis.  Bowel changes noted   On pelvic examination the patient appeared to have a 1 cm swelling in the lower aspect of the urethra that was quite smooth and soft.  It looks like urethral mucosal prolapse.  Having said that it was fairly discrete and smooth but I thought it was prolapse likely extending from about 3:00 to 9:00 but mainly inferiorly.  You could see the urethral meatus looks small above it.  Urethra itself and bladder was well supported.  She was a bit tender at the level of the introitus   She quit smoking 30 to 40 years ago    would like to place her on some local estrogen cream.  She may have bled into some urethral mucosal prolapse.  Time alone this may regress.  I like to see her back in about 6 weeks to reassess the lesion and perform cystoscopy.  With her being a bit tender this may be challenging but for safety reasons should be performed.  We will proceed accordingly.  Local estrogen cream apply 3 nights a week.  She is on blood thinners   TOday Frequency stable.  No hematuria.  Using estrogen cream On repeat pelvic examination patient had a smooth less than 1 cm protrusion at the inferior aspect of the urethral meatus that looked like urethral mucosal prolapse.  There was no erythema or mass-effect.  It was nice and smooth and you could see the urothelium Cystoscopy:  Patient underwent flexible cystoscopy.  Bladder mucosa and trigone were normal.  No foreign body.  No carcinoma.  Urethra was nice and long and completely normal.  Reassurance given. Picture drawn. Watchful waiting discussed. Using estrogen cream for another 2 months discussed. See as needed discussed.     Today I have not seen the patient for approximately 2 years.  I do not think she used the estrogen cream more than 1 time.  History was more challenging.  She says about twice a month she will get a shooting discomfort through the urethra that last for seconds.  She says she has been told by gynecology that she has a caruncle.  She says that it feels like something is going to fall out of the vaginal area.  He was nonspecific but said there is a heaviness.  She recently saw gynecology with a chronic discharge for years.  The discharge is liquid like and sometimes is itchy.  Apparently she was given doxycycline On feels examination patient had approximately 1 cm protrusion in the inferior aspect of the urethra meatus that was urethral mucosal prolapse.  It is unchanged from the above examination.  It was soft and supple.  No prolapse.  No vaginal discharge.    PMH: Past Medical History:  Diagnosis Date   Acute right-sided low back pain with right-sided sciatica 10/23/2017   Anemia  Anginal pain (HCC)    Bilateral ovarian cysts    Bilateral renal cysts    Bulimia nervosa 05/07/2021   CAD (coronary artery disease) 09/28/2016   Chest pain 10/14/2015   Chronic back pain    "upper and lower" (09/19/2014)   Chronic shoulder pain (Location of Primary Source of Pain) (Left) 01/29/2016   Colitis 04/08/2016   Colon polyp 01/30/2017   Coronary vasospasm (HCC) 12/09/2014   DDD (degenerative disc disease), cervical    a.) s/p ACDF C5-C7   Depression    DVT (deep venous thrombosis) (HCC)    Esophageal spasm    Essential hypertension 10/28/2013   Gallstones    Gastritis    Gastroenteritis     GERD (gastroesophageal reflux disease)    Heart disease 12/09/2016   High blood pressure    High cholesterol    History of blood transfusion 1985   related to hysterectomy   History of left heart catheterization (LHC) 12/09/2014   a.) LHC 12/09/2014: EF 50%; normal coronaries.   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 Rule out Paget's disease of the bone with blood work.    Hyperlipidemia 10/28/2013   Hypertension    Hypothyroidism    IBS (irritable bowel syndrome)    Long term current use of anticoagulant    a.) apixaban   Lumbar central spinal stenosis (moderate at L4-5; mild at L2-3 and L3-4) 01/29/2016   Migraine    "a few times/yr" (09/19/2014)   Myocardial infarction (HCC) 10/2010 X 3   "while hospitalized"   Neck pain 01/29/2016   Osteoarthritis    Pleural effusion, bilateral 07/31/2015   Pneumonia 04/2014   PONV (postoperative nausea and vomiting)    PTSD (post-traumatic stress disorder)    Pulmonary embolism (HCC) 04/2001; 09/19/2014   "after gallbladder OR; "   Schizoaffective disorder, depressive type (HCC) 08/28/2016   Sleep apnea    "mild" (09/19/2014)   Snoring    a. sleep study 5/16: No OSA   SOB (shortness of breath) 07/31/2015   Spinal stenosis    Spondylosis    Suicidal ideation 08/27/2016   a.) seen in ED with approx 6 months of reported SI. Reported ingestion of "unknown pills" to end life; last ingestion was 3 days PTA; UDS (+) for TCAs. SI related to depression, friend (loss of friend), and social determinants   Type II diabetes mellitus (HCC)    "dx'd in 2007; lost weight; no RX for ~ 4 yr now" (09/19/2014)    Surgical History: Past Surgical History:  Procedure Laterality Date   ABDOMINAL HYSTERECTOMY  1985   ANTERIOR CERVICAL DECOMP/DISCECTOMY FUSION  10/2012    in [place] C5-C7    APPENDECTOMY  1985   BACK SURGERY     BREAST CYST EXCISION Right    BREAST EXCISIONAL BIOPSY Right YRS AGO   NEG   CARDIAC CATHETERIZATION   2000's; 2009   CHOLECYSTECTOMY  2002   COLONOSCOPY WITH PROPOFOL N/A 12/12/2016   Procedure: COLONOSCOPY WITH PROPOFOL;  Surgeon: Wyline Mood, MD;  Location: [facility] ENDOSCOPY;  Service: Endoscopy;  Laterality: N/A;   COLONOSCOPY WITH PROPOFOL N/A 02/17/2017   Procedure: COLONOSCOPY WITH PROPOFOL;  Surgeon: Wyline Mood, MD;  Location: [facility] ENDOSCOPY;  Service: Gastroenterology;  Laterality: N/A;   COLONOSCOPY WITH PROPOFOL N/A  11/04/2021   Procedure: COLONOSCOPY WITH PROPOFOL;  Surgeon: Wyline Mood, MD;  Location: [facility] ENDOSCOPY;  Service: Gastroenterology;  Laterality: N/A;   CYSTOSCOPY N/A 05/15/2022   Procedure: CYSTOSCOPY;  Surgeon: Conard Novak, MD;  Location: [facility] ORS;  Service: Gynecology;  Laterality: N/A;   ESOPHAGOGASTRODUODENOSCOPY (EGD) WITH PROPOFOL N/A 02/17/2017   Procedure: ESOPHAGOGASTRODUODENOSCOPY (EGD) WITH PROPOFOL;  Surgeon: Wyline Mood, MD;  Location: [facility] ENDOSCOPY;  Service: Gastroenterology;  Laterality: N/A;   ESOPHAGOGASTRODUODENOSCOPY (EGD) WITH PROPOFOL N/A 03/07/2019   Procedure: ESOPHAGOGASTRODUODENOSCOPY (EGD) WITH PROPOFOL;  Surgeon: Wyline Mood, MD;  Location: [facility] ENDOSCOPY;  Service: Gastroenterology;  Laterality: N/A;   EXCISION/RELEASE BURSA HIP Right 12/1984   HERNIA REPAIR  1985   LEFT HEART CATHETERIZATION WITH CORONARY ANGIOGRAM N/A 12/09/2014   Procedure: LEFT HEART CATHETERIZATION WITH CORONARY ANGIOGRAM;  Surgeon: Kathleene Hazel, MD;  Location: [facility] CATH LAB;  Service: Cardiovascular;  Laterality: N/A;   ROBOTIC ASSISTED BILATERAL SALPINGO OOPHERECTOMY Bilateral 05/15/2022   Procedure: XI ROBOTIC ASSISTED BILATERAL OOPHORECTOMY;  Surgeon: Conard Novak, MD;  Location: [facility] ORS;  Service: Gynecology;  Laterality: Bilateral;   TONSILLECTOMY AND ADENOIDECTOMY  ~ 1968   TOTAL HIP  ARTHROPLASTY Left 04-06-2014   UMBILICAL HERNIA REPAIR  1985   VENA CAVA FILTER PLACEMENT  03/2014    Home Medications:  Allergies as of 04/27/2023       Reactions   Abilify [aripiprazole] Other (See Comments)   Jerking movement, slow motor skills   Augmentin [amoxicillin-pot Clavulanate] Diarrhea, Nausea And Vomiting   Bactrim [sulfamethoxazole-trimethoprim] Diarrhea   Ceftin [cefuroxime Axetil] Diarrhea   Coconut (cocos Nucifera)    Rash    Diclofenac Other (See Comments)    Muscle Pain   Dye Fdc Red [red Dye #40 (allura Red)]    "hair dye"   Indocin [indomethacin] Other (See Comments)   Migraines    Linaclotide Diarrhea   Lisinopril Diarrhea, Other (See Comments)   Other reaction(s): Dizziness, Vomiting   Morphine Other (See Comments)   Chest Tightness   Morphine And Codeine Other (See Comments)   Chest tightness    Simvastatin Other (See Comments)   Muscle Pain   Sulfa Antibiotics Diarrhea   Sulfamethoxazole-trimethoprim Diarrhea   Wellbutrin [bupropion] Other (See Comments)   Jerking movement Jerking movement Slurred speech   Zetia [ezetimibe]    Diarrhea   Quetiapine Fumarate Palpitations   Quetiapine Fumarate Palpitations        Medication List        Accurate as of April 27, 2023  9:16 AM. If you have any questions, ask your nurse or doctor.          acetaminophen 500 MG tablet Commonly known as: TYLENOL Take 1 tablet (500 mg total) by mouth every 6 (six) hours as needed.   b complex vitamins capsule Take 1 capsule by mouth daily.   baclofen 10 MG tablet Commonly known as: LIORESAL TAKE ONE TABLET BY MOUTH THREE TIMES DAILY AS NEEDED FOR MUSCLE SPASMS (VIAL)   blood glucose meter kit and supplies Kit Dispense based on patient and insurance preference. Use once daily fasting. Dx code E11.9.   dicyclomine 20 MG tablet Commonly known as: BENTYL TAKE ONE TABLET BY MOUTH THREE TIMES DAILY AS NEEDED FOR ABDOMINAL SPASMS   Eliquis 5 MG Tabs  tablet Generic drug: apixaban TAKE ONE TABLET BY MOUTH TWICE DAILY @ 9AM-5PM (VIAL)   empagliflozin 25 MG Tabs tablet Commonly known as: Jardiance Take 1 tablet (25 mg total) by mouth daily.  escitalopram 20 MG tablet Commonly known as: LEXAPRO TAKE ONE TABLET BY MOUTH DAILY AT 9AM   famotidine 20 MG tablet Commonly known as: PEPCID TAKE ONE TABLET BY MOUTH DAILY AT 9PM AT BEDTIME (VIAL)   fluticasone 50 MCG/ACT nasal spray Commonly known as: FLONASE SPRAY 2 SPRAYS INTO EACH NOSTRIL EVERY DAY   furosemide 20 MG tablet Commonly known as: LASIX TAKE ONE TABLET BY MOUTH DAILY AS NEEDED FOR EDEMA (VIAL)   gabapentin 600 MG tablet Commonly known as: NEURONTIN TAKE ONE TABLET BY MOUTH DAILY AT 9AM and TAKE 1 AND 1/2 TABLETS BY MOUTH DAILY AT 9PM (VIAL)   GHT Blood Glucose Monitor w/Device Kit DISPENSE BASED ON PATIENT AND INSURANCE PREFERENCE. USE ONCE DAILY AS DIRECTED. DX CODE E11.9.   isosorbide mononitrate 30 MG 24 hr tablet Commonly known as: IMDUR Take 0.5 tablets (15 mg total) by mouth daily.   levothyroxine 25 MCG tablet Commonly known as: SYNTHROID TAKE ONE TABLET BY MOUTH DAILY AT 7AM BEFORE BREAKFAST AND 2-3 HOURS BEFORE TAKING ANY OTHER MEDICTION OR SUPPLEMENT   nitroGLYCERIN 0.4 MG SL tablet Commonly known as: NITROSTAT Place 1 tablet (0.4 mg total) under the tongue every 5 (five) minutes as needed for chest pain.   OLANZapine 10 MG tablet Commonly known as: ZYPREXA TAKE 1 TABLET BY MOUTH EVERYDAY AT BEDTIME   omeprazole 40 MG capsule Commonly known as: PRILOSEC Take 1 capsule (40 mg total) by mouth daily.   ondansetron 4 MG disintegrating tablet Commonly known as: ZOFRAN-ODT Take 1 tablet (4 mg total) by mouth every 6 (six) hours as needed for nausea.   OVER THE COUNTER MEDICATION 1,500 mg daily. Yellowdock Root   OVER THE COUNTER MEDICATION 200 mg daily. Magnesium Citrate Gummy   OVER THE COUNTER MEDICATION Power C 2 gummies daily   Ozempic  (0.25 or 0.5 MG/DOSE) 2 MG/3ML Sopn Generic drug: Semaglutide(0.25 or 0.5MG /DOS) INJECT 0.5 MG INTO THE SKIN ONE TIME PER WEEK   pantoprazole 40 MG tablet Commonly known as: PROTONIX Take 1 tablet (40 mg total) by mouth daily.   potassium chloride SA 20 MEQ tablet Commonly known as: KLOR-CON M TAKE ONE TABLET BY MOUTH DAILY AS NEEDED TAKE WITH LASIX (VIAL)   rosuvastatin 40 MG tablet Commonly known as: CRESTOR TAKE ONE TABLET BY MOUTH DAILY AT 5PM   tobramycin-dexamethasone ophthalmic solution Commonly known as: TOBRADEX Place 2 drops into the left eye every 6 (six) hours.   Trintellix 20 MG Tabs tablet Generic drug: vortioxetine HBr Take 20 mg by mouth daily.   Vitamin A 2400 MCG (8000 UT) Tabs Take by mouth daily.   vitamin E 180 MG (400 UNITS) capsule Take 400 Units by mouth daily.   ZINC 15 PO Take by mouth daily.        Allergies:  Allergies  Allergen Reactions   Abilify [Aripiprazole] Other (See Comments)    Jerking movement, slow motor skills   Augmentin [Amoxicillin-Pot Clavulanate] Diarrhea and Nausea And Vomiting   Bactrim [Sulfamethoxazole-Trimethoprim] Diarrhea   Ceftin [Cefuroxime Axetil] Diarrhea   Coconut (Cocos Nucifera)     Rash    Diclofenac Other (See Comments)     Muscle Pain   Dye Fdc Red [Red Dye #40 (Allura Red)]     "hair dye"   Indocin [Indomethacin] Other (See Comments)    Migraines    Linaclotide Diarrhea   Lisinopril Diarrhea and Other (See Comments)    Other reaction(s): Dizziness, Vomiting   Morphine Other (See Comments)    Chest  Tightness   Morphine And Codeine Other (See Comments)    Chest tightness    Simvastatin Other (See Comments)    Muscle Pain   Sulfa Antibiotics Diarrhea   Sulfamethoxazole-Trimethoprim Diarrhea   Wellbutrin [Bupropion] Other (See Comments)    Jerking movement Jerking movement Slurred speech   Zetia [Ezetimibe]     Diarrhea    Quetiapine Fumarate Palpitations   Quetiapine Fumarate  Palpitations    Family History: Family History  Problem Relation Age of Onset   Stroke Mother        Deceased   Lung cancer Father        Deceased   Healthy Daughter    Healthy Son        x 2   Other Son        Suicide   Cancer Brother    Heart attack Neg Hx     Social History:  reports that she quit smoking about 44 years ago. Her smoking use included cigarettes. She started smoking about 54 years ago. She has a 15 pack-year smoking history. She has never used smokeless tobacco. She reports that she does not drink alcohol and does not use drugs.  ROS:                                        Physical Exam: There were no vitals taken for this visit.  Constitutional:  Alert and oriented, No acute distress. HEENT: [place] AT, moist mucus membranes.  Trachea midline, no masses.   Laboratory Data: Lab Results  Component Value Date   WBC 8.1 03/13/2023   HGB 12.3 03/13/2023   HCT 37.8 03/13/2023   MCV 95.9 03/13/2023   PLT 258 03/13/2023    Lab Results  Component Value Date   CREATININE 0.91 03/13/2023    No results found for: "PSA"  No results found for: "TESTOSTERONE"  Lab Results  Component Value Date   HGBA1C 6.0 (H) 03/13/2023    Urinalysis    Component Value Date/Time   COLORURINE YELLOW 05/21/2022 2350   APPEARANCEUR CLEAR 05/21/2022 2350   APPEARANCEUR Hazy (A) 11/05/2020 0937   LABSPEC 1.015 05/21/2022 2350   LABSPEC 1.021 05/27/2014 2137   PHURINE 5.0 05/21/2022 2350   GLUCOSEU >1,000 (A) 05/21/2022 2350   GLUCOSEU NEGATIVE 08/21/2017 1118   HGBUR NEGATIVE 05/21/2022 2350   BILIRUBINUR NEGATIVE 05/21/2022 2350   BILIRUBINUR Negative 11/05/2020 0937   BILIRUBINUR Negative 05/27/2014 2137   KETONESUR TRACE (A) 05/21/2022 2350   PROTEINUR NEGATIVE 05/21/2022 2350   UROBILINOGEN 1.0 10/23/2017 1027   UROBILINOGEN 1.0 08/21/2017 1118   NITRITE NEGATIVE 05/21/2022 2350   LEUKOCYTESUR NEGATIVE 05/21/2022 2350   LEUKOCYTESUR  Negative 05/27/2014 2137    Pertinent Imaging: Urine reviewed and sent for culture  Assessment & Plan: Patient has urethral mucosal prolapse.  Picture drawn.  She understands that if it was removed it likely will not help any of her symptoms but it could.  She understands that these are generally asymptomatic.  The patient and I spoke about his safety cystoscopy even though she has no blood in the urine.  After lengthy discussion I am going to send her to urogynecology at [facility] in [place].  The patient and they may decide to remove the urethral mucosal prolapse hoping that some of her symptoms will settle down since this is a chronic concern.  She agreed with the plan.  The reason for referral discussed  1. Urethral caruncle  - Urinalysis, Complete   No follow-ups on file.  Martina Sinner, MD  [facility] [address] [place], [place] [address] [phone]

## 2023-04-30 LAB — CULTURE, URINE COMPREHENSIVE

## 2023-05-04 ENCOUNTER — Other Ambulatory Visit: Payer: Self-pay

## 2023-05-04 ENCOUNTER — Emergency Department
Admission: EM | Admit: 2023-05-04 | Discharge: 2023-05-04 | Disposition: A | Discharge status: Home / Self Care | Payer: 59 | Attending: Emergency Medicine | Admitting: Emergency Medicine

## 2023-05-04 ENCOUNTER — Emergency Department: Payer: 59

## 2023-05-04 ENCOUNTER — Encounter: Payer: Self-pay | Admitting: Emergency Medicine

## 2023-05-04 DIAGNOSIS — S92155A Nondisplaced avulsion fracture (chip fracture) of left talus, initial encounter for closed fracture: Secondary | ICD-10-CM

## 2023-05-04 DIAGNOSIS — S82832A Other fracture of upper and lower end of left fibula, initial encounter for closed fracture: Secondary | ICD-10-CM

## 2023-05-04 DIAGNOSIS — W010XXA Fall on same level from slipping, tripping and stumbling without subsequent striking against object, initial encounter: Secondary | ICD-10-CM | POA: Diagnosis not present

## 2023-05-04 DIAGNOSIS — M7732 Calcaneal spur, left foot: Secondary | ICD-10-CM | POA: Diagnosis not present

## 2023-05-04 DIAGNOSIS — S92152A Displaced avulsion fracture (chip fracture) of left talus, initial encounter for closed fracture: Secondary | ICD-10-CM | POA: Diagnosis not present

## 2023-05-04 DIAGNOSIS — S99912A Unspecified injury of left ankle, initial encounter: Secondary | ICD-10-CM | POA: Diagnosis present

## 2023-05-04 DIAGNOSIS — M25572 Pain in left ankle and joints of left foot: Secondary | ICD-10-CM | POA: Diagnosis not present

## 2023-05-04 MED ORDER — ACETAMINOPHEN 500 MG PO TABS
1000.0000 mg | ORAL_TABLET | Freq: Once | ORAL | Status: AC
  Administered 2023-05-04: 1000 mg via ORAL
  Filled 2023-05-04: qty 2

## 2023-05-04 NOTE — Discharge Instructions (Addendum)
As we discussed, you have 2 very small fractures in your left ankle.  Please wear the boot we provided to provide stabilization and keep the bones from moving around.  Use the crutches we have provided to help you walk and to help you avoid putting any more than just a little bit of weight on your left foot and ankle.  You should try to avoid bearing any weight on your left foot if possible.  When you are seated or lying down, please keep your foot elevated on 1 or more pillows.  You can take over-the-counter ibuprofen and/or Tylenol as needed for pain control.  Please call the office of Dr. Logan Bores on Tuesday to schedule a follow-up appointment with him or one of the other podiatrist (foot and ankle specialists).  Return to the emergency department if you develop new or worsening symptoms that concern you.

## 2023-05-04 NOTE — ED Provider Notes (Signed)
[facility] Provider Note    Event Date/Time   First MD Initiated Contact with Patient 05/04/23 [phone]     (approximate)   History   Fall and Leg Injury   HPI Valerie Vazquez is a 67 y.o. female who presents for evaluation of pain in her left ankle.  She states that about 18 hours ago, approximately 8:30 in the morning yesterday, she got up and somehow twisted her left ankle and fell, causing pain to her left foot and ankle.  She was hoping it would get better over the course of the day but instead has become increasingly painful for her to walk on it.  She called a nurse line and was told to come to the ED to be checked out.  Because of a history of pulmonary embolism, she takes blood thinners (Eliquis).  She has no other injuries and specifically did not strike her head nor lose consciousness.     Physical Exam   Triage Vital Signs: ED Triage Vitals  Encounter Vitals Group     BP 05/04/23 0024 135/71     Systolic BP Percentile --      Diastolic BP Percentile --      Pulse Rate 05/04/23 0024 79     Resp 05/04/23 0024 16     Temp 05/04/23 0024 98.5 F (36.9 C)     Temp Source 05/04/23 0024 Oral     SpO2 05/04/23 0024 99 %     Weight 05/04/23 0022 85.3 kg (188 lb)     Height 05/04/23 0022 1.676 m (5\' 6" )     Head Circumference --      Peak Flow --      Pain Score 05/04/23 0022 3     Pain Loc --      Pain Education --      Exclude from Growth Chart --     Most recent vital signs: Vitals:   05/04/23 0024  BP: 135/71  Pulse: 79  Resp: 16  Temp: 98.5 F (36.9 C)  SpO2: 99%    General: Awake, no distress.  CV:  Good peripheral perfusion.  Easily palpable pulse in the left foot and the extremity is warm and the feet are equivalent bilaterally. Resp:  Normal effort. Speaking easily and comfortably, no accessory muscle usage nor intercostal retractions.   Abd:  No distention.  MSK:  Swelling to the left lateral malleolus.  Tenderness to  palpation of the top of the left foot extending proximally to the left ankle, the lateral malleolus, and just proximal to the malleolus.  No tenderness to palpitation or manipulation proximal to the ankle and all the way up the leg to and including the left knee.  The pain and tenderness and swelling seem to be isolated to the left ankle and left foot.   ED Results / Procedures / Treatments   Labs (all labs ordered are listed, but only abnormal results are displayed) Labs Reviewed - No data to display   RADIOLOGY I viewed and interpreted the patient's left ankle x-rays.  I did not appreciate any obvious fracture or dislocation.  The radiologist mentioned a possibility of a distal fibular fracture and possible talar involvement I recommended a CT scan for additional evaluation.  I also viewed and interpreted the CT of the left ankle.  See hospital course for details.   PROCEDURES:  Critical Care performed: No  Procedures    IMPRESSION / MDM / ASSESSMENT AND PLAN /  ED COURSE  I reviewed the triage vital signs and the nursing notes.                              Differential diagnosis includes, but is not limited to, sprain, fracture, dislocation.  Patient's presentation is most consistent with acute complicated illness / injury requiring diagnostic workup.  Labs/studies ordered: X-rays of the left ankle, CT of the left ankle  Interventions/Medications given:  Medications  acetaminophen (TYLENOL) tablet 1,000 mg (has no administration in time range)    (Note:  hospital course my include additional interventions and/or labs/studies not listed above.)   Vital signs stable, no injuries other than to the left ankle/foot.  X-rays equivocal as documented above, proceeding with CT of the ankle since that we will determine treatment as an outpatient and the specific requirements of outpatient follow-up.  Patient agrees with the plan.   Clinical Course as of 05/04/23 0328  Mon May 04, 2023  4742 CT Ankle Left Wo Contrast I viewed and interpreted the CT of the left ankle.  I did not immediately appreciate any abnormalities, but the radiologist mentioned a fracture of the distal fibula and lateral talus.  I considered various options but I feel that a CAM boot will be the most effective and appropriate for the patient.  I updated her about this and the plan for nonweightbearing, crutch use, elevation, and podiatry follow-up.  She understands and agrees with the plan. [CF]    Clinical Course User Index [CF] Loleta Rose, MD     FINAL CLINICAL IMPRESSION(S) / ED DIAGNOSES   Final diagnoses:  Closed fracture of distal end of left fibula, unspecified fracture morphology, initial encounter  Nondisplaced avulsion fracture (chip fracture) of left talus, initial encounter for closed fracture     Rx / DC Orders   ED Discharge Orders     None        Note:  This document was prepared using Dragon voice recognition software and may include unintentional dictation errors.   Loleta Rose, MD 05/04/23 [phone]

## 2023-05-04 NOTE — ED Triage Notes (Signed)
Pt arrived via POV with reports of fall earlier this morning, states she twisted her L leg, painful to walk on L leg.  Was advised by nurse line to come get checked out.  Denies any head injury.

## 2023-05-07 ENCOUNTER — Encounter: Payer: Self-pay | Admitting: Physician Assistant

## 2023-05-07 ENCOUNTER — Ambulatory Visit (INDEPENDENT_AMBULATORY_CARE_PROVIDER_SITE_OTHER): Payer: 59 | Admitting: Physician Assistant

## 2023-05-07 ENCOUNTER — Telehealth: Payer: Self-pay | Admitting: Family Medicine

## 2023-05-07 VITALS — BP 94/61 | HR 83 | Temp 98.4°F | Ht 66.0 in | Wt 187.6 lb

## 2023-05-07 DIAGNOSIS — K219 Gastro-esophageal reflux disease without esophagitis: Secondary | ICD-10-CM | POA: Diagnosis not present

## 2023-05-07 DIAGNOSIS — K589 Irritable bowel syndrome without diarrhea: Secondary | ICD-10-CM | POA: Diagnosis not present

## 2023-05-07 DIAGNOSIS — K2970 Gastritis, unspecified, without bleeding: Secondary | ICD-10-CM | POA: Diagnosis not present

## 2023-05-07 DIAGNOSIS — R110 Nausea: Secondary | ICD-10-CM

## 2023-05-07 DIAGNOSIS — K582 Mixed irritable bowel syndrome: Secondary | ICD-10-CM

## 2023-05-07 MED ORDER — ONDANSETRON 4 MG PO TBDP: 4.0000 mg | ORAL_TABLET | Freq: Four times a day (QID) | ORAL | 2 refills | Status: DC | PRN

## 2023-05-07 MED ORDER — PANTOPRAZOLE SODIUM 40 MG PO TBEC
40.0000 mg | DELAYED_RELEASE_TABLET | Freq: Every day | ORAL | 1 refills | Status: DC
Start: 2023-05-07 — End: 2023-10-21

## 2023-05-07 MED ORDER — SUCRALFATE 1 G PO TABS
1.0000 g | ORAL_TABLET | Freq: Three times a day (TID) | ORAL | 2 refills | Status: DC
Start: 2023-05-07 — End: 2023-07-27

## 2023-05-07 NOTE — Telephone Encounter (Signed)
Prescription Request  05/07/2023  LOV: 03/13/2023  What is the name of the medication or equipment? dicyclomine and isosorbide mononitrate   Have you contacted your pharmacy to request a refill? No   Which pharmacy would you like this sent to? [facility]   Patient notified that their request is being sent to the clinical staff for review and that they should receive a response within 2 business days.   Please advise at Mobile [phone] (mobile)

## 2023-05-07 NOTE — Progress Notes (Signed)
Celso Amy, PA-C [address]  [address]  [place], [place] [address]  Main: [phone]  Fax: [phone]   Primary Care Physician: Glori Luis, MD  Primary Gastroenterologist:  Celso Amy, PA-C / Dr. Wyline Mood    CC: F/U Epigastric Pain, GERD, Gastritis, IBS  HPI: Valerie Vazquez is a 67 y.o. female returns for follow-up of chronic epigastric pain, nausea, acid reflux, gastritis, and irritable bowel syndrome.  She has past history of diarrhea and constipation.  Currently having bowel movement daily.  Diarrhea improves if she takes dicyclomine.  Patient has had these chronic GI symptoms for many years.  Episodes of nausea with occasional vomiting.  She needs refill of Zofran.  She takes omeprazole and famotidine which is not controlling her reflux.  Still having discomfort in the epigastrium.   -Colonoscopy 02/17/17 - 6 mm serrated adenoma resected. CT abdomen in 09/2016 showed no abnormalities of the liver .  -Colonoscopy 10/2021: 10mm adenoma cecal polyp; 7mm adenoma sigmoid polyp.  3 year repeat. -02/16/2019: H pylori stool antigen negative -03/07/2019: EGD: gastric bx - Chronic gastritis; NO h. Pylori. -S/p cholecystectomy in 2002. -09/19/2020 : Abd / Pelvic CT scan: for hematuria work up and was found to incidentally have Mild wall thickening with adjacent inflammation involving the sigmoid colon, which may represent a mild infectious or inflammatory colitis. -RUQ Abd Ultrasound 04/2023: Cholecystectomy, normal common bile duct 3.5 mm, normal liver.  Current Outpatient Medications  Medication Sig Dispense Refill   acetaminophen (TYLENOL) 500 MG tablet Take 1 tablet (500 mg total) by mouth every 6 (six) hours as needed. 30 tablet 0   b complex vitamins capsule Take 1 capsule by mouth daily.     baclofen (LIORESAL) 10 MG tablet TAKE ONE TABLET BY MOUTH THREE TIMES DAILY AS NEEDED FOR MUSCLE SPASMS (VIAL) 60 tablet 11   blood glucose meter kit and supplies KIT Dispense  based on patient and insurance preference. Use once daily fasting. Dx code E11.9. 1 each 0   Blood Glucose Monitoring Suppl (GHT BLOOD GLUCOSE MONITOR) w/Device KIT DISPENSE BASED ON PATIENT AND INSURANCE PREFERENCE. USE ONCE DAILY AS DIRECTED. DX CODE E11.9. 1 kit 0   dicyclomine (BENTYL) 20 MG tablet TAKE ONE TABLET BY MOUTH THREE TIMES DAILY AS NEEDED FOR ABDOMINAL SPASMS 270 tablet 3   ELIQUIS 5 MG TABS tablet TAKE ONE TABLET BY MOUTH TWICE DAILY @ 9AM-5PM (VIAL) 60 tablet 11   empagliflozin (JARDIANCE) 25 MG TABS tablet Take 1 tablet (25 mg total) by mouth daily. 90 tablet 3   escitalopram (LEXAPRO) 20 MG tablet TAKE ONE TABLET BY MOUTH DAILY AT 9AM 90 tablet 0   famotidine (PEPCID) 20 MG tablet TAKE ONE TABLET BY MOUTH DAILY AT 9PM AT BEDTIME (VIAL) 90 tablet 11   fluticasone (FLONASE) 50 MCG/ACT nasal spray SPRAY 2 SPRAYS INTO EACH NOSTRIL EVERY DAY 48 mL 2   furosemide (LASIX) 20 MG tablet TAKE ONE TABLET BY MOUTH DAILY AS NEEDED FOR EDEMA (VIAL) 90 tablet 3   gabapentin (NEURONTIN) 600 MG tablet TAKE ONE TABLET BY MOUTH DAILY AT 9AM and TAKE 1 AND 1/2 TABLETS BY MOUTH DAILY AT 9PM (VIAL) 225 tablet 11   isosorbide mononitrate (IMDUR) 30 MG 24 hr tablet Take 0.5 tablets (15 mg total) by mouth daily.     levothyroxine (SYNTHROID) 25 MCG tablet TAKE ONE TABLET BY MOUTH DAILY AT 7AM BEFORE BREAKFAST AND 2-3 HOURS BEFORE TAKING ANY OTHER MEDICTION OR SUPPLEMENT 90 tablet 3  nitroGLYCERIN (NITROSTAT) 0.4 MG SL tablet Place 1 tablet (0.4 mg total) under the tongue every 5 (five) minutes as needed for chest pain. 25 tablet 3   OLANZapine (ZYPREXA) 10 MG tablet TAKE 1 TABLET BY MOUTH EVERYDAY AT BEDTIME 90 tablet 0   omeprazole (PRILOSEC) 40 MG capsule Take 1 capsule (40 mg total) by mouth daily. 90 capsule 0   ondansetron (ZOFRAN-ODT) 4 MG disintegrating tablet Take 1 tablet (4 mg total) by mouth every 6 (six) hours as needed for nausea. 20 tablet 0   OVER THE COUNTER MEDICATION 1,500 mg daily.  Yellowdock Root     OVER THE COUNTER MEDICATION 200 mg daily. Magnesium Citrate Gummy     OVER THE COUNTER MEDICATION Power C 2 gummies daily     pantoprazole (PROTONIX) 40 MG tablet Take 1 tablet (40 mg total) by mouth daily. 90 tablet 1   potassium chloride SA (KLOR-CON M) 20 MEQ tablet TAKE ONE TABLET BY MOUTH DAILY AS NEEDED TAKE WITH LASIX (VIAL) 90 tablet 11   rosuvastatin (CRESTOR) 40 MG tablet TAKE ONE TABLET BY MOUTH DAILY AT 5PM 90 tablet 3   Semaglutide,0.25 or 0.5MG /DOS, (OZEMPIC, 0.25 OR 0.5 MG/DOSE,) 2 MG/3ML SOPN INJECT 0.5 MG INTO THE SKIN ONE TIME PER WEEK 3 mL 2   tobramycin-dexamethasone (TOBRADEX) ophthalmic solution Place 2 drops into the left eye every 6 (six) hours.     Vitamin A 2400 MCG (8000 UT) TABS Take by mouth daily.     vitamin E 180 MG (400 UNITS) capsule Take 400 Units by mouth daily.     vortioxetine HBr (TRINTELLIX) 20 MG TABS tablet Take 20 mg by mouth daily.     Zinc Sulfate (ZINC 15 PO) Take by mouth daily.     No current facility-administered medications for this visit.    Allergies as of 05/07/2023 - Review Complete 05/04/2023  Allergen Reaction Noted   Abilify [aripiprazole] Other (See Comments) 09/20/2013   Augmentin [amoxicillin-pot clavulanate] Diarrhea and Nausea And Vomiting 09/20/2013   Bactrim [sulfamethoxazole-trimethoprim] Diarrhea 09/20/2013   Ceftin [cefuroxime axetil] Diarrhea 09/20/2013   Coconut (cocos nucifera)  10/19/2015   Diclofenac Other (See Comments) 08/01/2014   Dye fdc red [red dye #40 (allura red)]  01/22/2016   Indocin [indomethacin] Other (See Comments) 09/20/2013   Linaclotide Diarrhea 12/09/2014   Lisinopril Diarrhea and Other (See Comments) 09/20/2013   Morphine Other (See Comments) 12/09/2014   Morphine and codeine Other (See Comments) 09/20/2013   Simvastatin Other (See Comments) 08/01/2014   Sulfa antibiotics Diarrhea 12/09/2014   Sulfamethoxazole-trimethoprim Diarrhea 12/09/2014   Wellbutrin [bupropion] Other  (See Comments) 09/20/2013   Zetia [ezetimibe]  09/18/2017   Quetiapine fumarate Palpitations 08/01/2014   Quetiapine fumarate Palpitations 08/16/2014    Past Medical History:  Diagnosis Date   Acute right-sided low back pain with right-sided sciatica 10/23/2017   Anemia    Anginal pain (HCC)    Bilateral ovarian cysts    Bilateral renal cysts    Bulimia nervosa 05/07/2021   CAD (coronary artery disease) 09/28/2016   Chest pain 10/14/2015   Chronic back pain    "upper and lower" (09/19/2014)   Chronic shoulder pain (Location of Primary Source of Pain) (Left) 01/29/2016   Colitis 04/08/2016   Colon polyp 01/30/2017   Coronary vasospasm (HCC) 12/09/2014   DDD (degenerative disc disease), cervical    a.) s/p ACDF C5-C7   Depression    DVT (deep venous thrombosis) (HCC)    Esophageal spasm    Essential hypertension  10/28/2013   Gallstones    Gastritis    Gastroenteritis    GERD (gastroesophageal reflux disease)    Heart disease 12/09/2016   High blood pressure    High cholesterol    History of blood transfusion 1985   related to hysterectomy   History of left heart catheterization (LHC) 12/09/2014   a.) LHC 12/09/2014: EF 50%; normal coronaries.   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 Rule out Paget's disease of the bone with blood work.    Hyperlipidemia 10/28/2013   Hypertension    Hypothyroidism    IBS (irritable bowel syndrome)    Long term current use of anticoagulant    a.) apixaban   Lumbar central spinal stenosis (moderate at L4-5; mild at L2-3 and L3-4) 01/29/2016   Migraine    "a few times/yr" (09/19/2014)   Myocardial infarction (HCC) 10/2010 X 3   "while hospitalized"   Neck pain 01/29/2016   Osteoarthritis    Pleural effusion, bilateral 07/31/2015   Pneumonia  04/2014   PONV (postoperative nausea and vomiting)    PTSD (post-traumatic stress disorder)    Pulmonary embolism (HCC) 04/2001; 09/19/2014   "after gallbladder OR; "   Schizoaffective disorder, depressive type (HCC) 08/28/2016   Sleep apnea    "mild" (09/19/2014)   Snoring    a. sleep study 5/16: No OSA   SOB (shortness of breath) 07/31/2015   Spinal stenosis    Spondylosis    Suicidal ideation 08/27/2016   a.) seen in ED with approx 6 months of reported SI. Reported ingestion of "unknown pills" to end life; last ingestion was 3 days PTA; UDS (+) for TCAs. SI related to depression, friend (loss of friend), and social determinants   Type II diabetes mellitus (HCC)    "dx'd in 2007; lost weight; no RX for ~ 4 yr now" (09/19/2014)    Past Surgical History:  Procedure Laterality Date   ABDOMINAL HYSTERECTOMY  1985   ANTERIOR CERVICAL DECOMP/DISCECTOMY FUSION  10/2012   in [place] C5-C7    APPENDECTOMY  1985   BACK SURGERY     BREAST CYST EXCISION Right    BREAST EXCISIONAL BIOPSY Right YRS AGO   NEG   CARDIAC CATHETERIZATION   2000's; 2009   CHOLECYSTECTOMY  2002   COLONOSCOPY WITH PROPOFOL N/A 12/12/2016   Procedure: COLONOSCOPY WITH PROPOFOL;  Surgeon: Wyline Mood, MD;  Location: [facility] ENDOSCOPY;  Service: Endoscopy;  Laterality: N/A;   COLONOSCOPY WITH PROPOFOL N/A 02/17/2017   Procedure: COLONOSCOPY WITH PROPOFOL;  Surgeon: Wyline Mood, MD;  Location: [facility] ENDOSCOPY;  Service: Gastroenterology;  Laterality: N/A;   COLONOSCOPY WITH PROPOFOL N/A 11/04/2021   Procedure: COLONOSCOPY WITH PROPOFOL;  Surgeon: Wyline Mood, MD;  Location: [facility] ENDOSCOPY;  Service: Gastroenterology;  Laterality: N/A;   CYSTOSCOPY N/A 05/15/2022   Procedure: CYSTOSCOPY;  Surgeon: Conard Novak, MD;  Location: [facility] ORS;  Service: Gynecology;  Laterality: N/A;   ESOPHAGOGASTRODUODENOSCOPY (EGD) WITH PROPOFOL N/A 02/17/2017   Procedure: ESOPHAGOGASTRODUODENOSCOPY (EGD) WITH PROPOFOL;  Surgeon: Wyline Mood, MD;   Location: [facility] ENDOSCOPY;  Service: Gastroenterology;  Laterality: N/A;   ESOPHAGOGASTRODUODENOSCOPY (EGD) WITH PROPOFOL N/A 03/07/2019   Procedure: ESOPHAGOGASTRODUODENOSCOPY (EGD) WITH PROPOFOL;  Surgeon: Wyline Mood, MD;  Location: [facility] ENDOSCOPY;  Service: Gastroenterology;  Laterality: N/A;   EXCISION/RELEASE  BURSA HIP Right 12/1984   HERNIA REPAIR  1985   LEFT HEART CATHETERIZATION WITH CORONARY ANGIOGRAM N/A 12/09/2014   Procedure: LEFT HEART CATHETERIZATION WITH CORONARY ANGIOGRAM;  Surgeon: Kathleene Hazel, MD;  Location: [facility] CATH LAB;  Service: Cardiovascular;  Laterality: N/A;   ROBOTIC ASSISTED BILATERAL SALPINGO OOPHERECTOMY Bilateral 05/15/2022   Procedure: XI ROBOTIC ASSISTED BILATERAL OOPHORECTOMY;  Surgeon: Conard Novak, MD;  Location: [facility] ORS;  Service: Gynecology;  Laterality: Bilateral;   TONSILLECTOMY AND ADENOIDECTOMY  ~ 1968   TOTAL HIP ARTHROPLASTY Left 04-06-2014   UMBILICAL HERNIA REPAIR  1985   VENA CAVA FILTER PLACEMENT  03/2014    Review of Systems:    All systems reviewed and negative except where noted in HPI.   Physical Examination:   There were no vitals taken for this visit.  General: Well-nourished, well-developed in no acute distress.  Lungs: Clear to auscultation bilaterally. Non-labored. Heart: Regular rate and rhythm, no murmurs rubs or gallops.  Abdomen: Bowel sounds are normal; Abdomen is Soft; No hepatosplenomegaly, masses or hernias;  No Abdominal Tenderness; No guarding or rebound tenderness. Neuro: Alert and oriented x 3.  Grossly intact.  Psych: Alert and cooperative, normal mood and affect.   Imaging Studies: CT Ankle Left Wo Contrast  Result Date: 05/04/2023 CLINICAL DATA:  Ankle trauma, fracture, xray done (Age >= 5y) Pt arrived via POV with reports of fall earlier this morning, states she twisted her L leg, painful to walk on L leg. Was advised by nurse line to come get checked out. Denies any head injury EXAM: CT OF THE LEFT  ANKLE WITHOUT CONTRAST TECHNIQUE: Multidetector CT imaging of the left ankle was performed according to the standard protocol. Multiplanar CT image reconstructions were also generated. RADIATION DOSE REDUCTION: This exam was performed according to the departmental dose-optimization program which includes automated exposure control, adjustment of the mA and/or kV according to patient size and/or use of iterative reconstruction technique. COMPARISON:  X-ray left ankle 05/04/2023 FINDINGS: Bones/Joint/Cartilage Acute tiny avulsion fracture off of the lateral talus as well as infra syndesmotic distal fibula. No dislocation. No widening of the medial clear space. No medial malleolar posterior malleolar fractures. Posterior and plantar calcaneal spurs are noted. Degenerative changes of the talar navicular joint. Ligaments Suboptimally assessed by CT. Muscles and Tendons Grossly unremarkable. Soft tissues Associated subcutaneus soft tissue edema. IMPRESSION: 1. Acute tiny avulsion fracture off of the lateral talus. 2. Acute tiny avulsion fracture of the infra-syndesmotic distal fibula. Electronically Signed   By: Tish Frederickson M.D.   On: 05/04/2023 02:43   DG Ankle Complete Left  Result Date: 05/04/2023 CLINICAL DATA:  144615 Pain 144615 Pt reports of fall earlier this morning, states she twisted her L leg, painful to walk on L leg. Was advised by nurse line to come get checked out. EXAM: LEFT ANKLE COMPLETE - 3+ VIEW COMPARISON:  None Available. FINDINGS: Likely acute nondisplaced fracture of the medial aspect of distal fibula. Associated medial talar fracture not excluded. No dislocation or joint effusion. There is no evidence of arthropathy or other focal bone abnormality. Posterior and plantar calcaneal spur. Subcutaneus soft tissue edema. IMPRESSION: Likely acute nondisplaced fracture of the medial aspect of distal fibula. Associated medial talar fracture not excluded. Limited evaluation due to overlapping  osseous structures and overlying soft tissues. Consider CT for further evaluation. Electronically Signed   By: Tish Frederickson M.D.   On: 05/04/2023 01:47    Assessment and Plan:   SYNAI MAGNER is a  67 y.o. y/o female returns for follow-up of chronic GI symptoms.  1.  GERD  Stop omeprazole  Stop famotidine  Start pantoprazole 40 Mg 1 tablet once daily 30 minutes before meal.  Recommend Lifestyle Modifications to prevent Acid Reflux.  Rec. Avoid coffee, sodas, peppermint, citrus fruits, and spicey foods.  Avoid eating 2-3 hours before bedtime.   2.  Gastritis (negative H. pylori)  Start Carafate 1 g 1 tablet 3 times daily before meals.  3.  IBS  Continue dicyclomine as needed  Low FODMAP diet.   Celso Amy, PA-C  Follow up 2 months with TG.

## 2023-05-08 DIAGNOSIS — S82832A Other fracture of upper and lower end of left fibula, initial encounter for closed fracture: Secondary | ICD-10-CM | POA: Diagnosis not present

## 2023-05-08 DIAGNOSIS — E119 Type 2 diabetes mellitus without complications: Secondary | ICD-10-CM | POA: Diagnosis not present

## 2023-05-08 DIAGNOSIS — E039 Hypothyroidism, unspecified: Secondary | ICD-10-CM | POA: Diagnosis not present

## 2023-05-08 DIAGNOSIS — E1142 Type 2 diabetes mellitus with diabetic polyneuropathy: Secondary | ICD-10-CM | POA: Diagnosis not present

## 2023-05-12 ENCOUNTER — Telehealth: Payer: Self-pay

## 2023-05-12 NOTE — Telephone Encounter (Signed)
Patient states she broke her ankle and went to the ED.  Patient states she has also seen Dr. Ether Griffins (foot doctor) who told her that she needs to follow-up with her PCP.  I have scheduled an appointment with Dr. [name] on 05/20/2023.  Patient states she is experiencing numbness across her left foot and 6-8 inches above ankle.  Patient states she was experiencing this at the time she was seen in the ED.  I offered patient an appointment with Bethanie Dicker, NP, on 05/13/2023, but patient states she would like to wait and see Dr. Birdie Sons.

## 2023-05-13 NOTE — Telephone Encounter (Signed)
Patient states she had some numbness in both feet before the ankle break but since the ankle break it goes up into the left leg a little.

## 2023-05-13 NOTE — Telephone Encounter (Signed)
Noted.  Was this going on before she broke her ankle or did it start after she broke her ankle?  If it worsens at all she needs to be seen sooner than her scheduled visit.

## 2023-05-13 NOTE — Telephone Encounter (Signed)
noted 

## 2023-05-13 NOTE — Telephone Encounter (Signed)
Noted.  I will see her as planned.

## 2023-05-18 ENCOUNTER — Ambulatory Visit: Payer: 59 | Admitting: Family Medicine

## 2023-05-20 ENCOUNTER — Other Ambulatory Visit: Payer: Self-pay | Admitting: Family Medicine

## 2023-05-20 ENCOUNTER — Encounter: Payer: Self-pay | Admitting: Family Medicine

## 2023-05-20 ENCOUNTER — Encounter: Payer: Self-pay | Admitting: Neurology

## 2023-05-20 ENCOUNTER — Ambulatory Visit (INDEPENDENT_AMBULATORY_CARE_PROVIDER_SITE_OTHER): Payer: 59 | Admitting: Family Medicine

## 2023-05-20 VITALS — BP 124/80 | HR 83 | Temp 98.6°F | Ht 66.0 in | Wt 186.4 lb

## 2023-05-20 DIAGNOSIS — F419 Anxiety disorder, unspecified: Secondary | ICD-10-CM

## 2023-05-20 DIAGNOSIS — R200 Anesthesia of skin: Secondary | ICD-10-CM | POA: Diagnosis not present

## 2023-05-20 DIAGNOSIS — F32A Depression, unspecified: Secondary | ICD-10-CM

## 2023-05-20 DIAGNOSIS — E119 Type 2 diabetes mellitus without complications: Secondary | ICD-10-CM

## 2023-05-20 NOTE — Assessment & Plan Note (Signed)
This has been going on for a few weeks and has been improving some.  Concerned that this could be related to impingement in her back.  It seems to be less likely related to a central nervous system process given this is relatively focal to her left lower leg.  We will get an MRI lumbar spine.  I have also referred to neurology to consider nerve conduction testing.

## 2023-05-20 NOTE — Assessment & Plan Note (Signed)
Chronic issue.  She will continue to see psychiatry.  They are now managing her psychiatric medications.

## 2023-05-20 NOTE — Progress Notes (Signed)
[name], MD Phone: [phone]  Valerie Vazquez is a 67 y.o. female who presents today for same-day visit.  Left leg numbness: Patient notes onset of symptoms on September 1.  She stepped out of bed and her left lower leg felt numb.  She ended up falling and breaking her ankle.  She is seeing podiatry for the ankle fracture.  She notes the numbness was not any worse after the broken ankle.  She does have a history of neuropathy and has chronic numbness issues in her bilateral feet.  She notes numbness in her left leg came up to just below her knee.  It has gotten somewhat better.  She still has some patches that are numb particularly around the ankle.  She does have chronic issues with back pain though that has not been changed.  No saddle anesthesia.  She has chronic urine and bowel incontinence though those things have not changed either.  She does not have a pacemaker.  She notes the only metal in her body is from left hip surgery and cervical spine fusion.  Anxiety/depression: Patient reports these things are stable.  Has been dealing a lot with her son who is incarcerated and has mental health issues.  She tried to get him the help he needs.  She reports she is currently on Lexapro, Zyprexa, and Trintellix.  She follows with psychiatry for management of those medications.  No SI.  Social History   Tobacco Use  Smoking Status Former   Current packs/day: 0.00   Average packs/day: 1.5 packs/day for 10.0 years (15.0 ttl pk-yrs)   Types: Cigarettes   Start date: 04/11/1969   Quit date: 04/12/1979   Years since quitting: 44.1  Smokeless Tobacco Never    Current Outpatient Medications on File Prior to Visit  Medication Sig Dispense Refill   acetaminophen (TYLENOL) 500 MG tablet Take 1 tablet (500 mg total) by mouth every 6 (six) hours as needed. 30 tablet 0   b complex vitamins capsule Take 1 capsule by mouth daily.     baclofen (LIORESAL) 10 MG tablet TAKE ONE TABLET BY MOUTH THREE  TIMES DAILY AS NEEDED FOR MUSCLE SPASMS (VIAL) 60 tablet 11   blood glucose meter kit and supplies KIT Dispense based on patient and insurance preference. Use once daily fasting. Dx code E11.9. 1 each 0   Blood Glucose Monitoring Suppl (GHT BLOOD GLUCOSE MONITOR) w/Device KIT DISPENSE BASED ON PATIENT AND INSURANCE PREFERENCE. USE ONCE DAILY AS DIRECTED. DX CODE E11.9. 1 kit 0   dicyclomine (BENTYL) 20 MG tablet TAKE ONE TABLET BY MOUTH THREE TIMES DAILY AS NEEDED FOR ABDOMINAL SPASMS 270 tablet 3   ELIQUIS 5 MG TABS tablet TAKE ONE TABLET BY MOUTH TWICE DAILY @ 9AM-5PM (VIAL) 60 tablet 11   empagliflozin (JARDIANCE) 25 MG TABS tablet Take 1 tablet (25 mg total) by mouth daily. 90 tablet 3   escitalopram (LEXAPRO) 20 MG tablet TAKE ONE TABLET BY MOUTH DAILY AT 9AM 90 tablet 0   fluticasone (FLONASE) 50 MCG/ACT nasal spray SPRAY 2 SPRAYS INTO EACH NOSTRIL EVERY DAY 48 mL 2   furosemide (LASIX) 20 MG tablet TAKE ONE TABLET BY MOUTH DAILY AS NEEDED FOR EDEMA (VIAL) 90 tablet 3   gabapentin (NEURONTIN) 600 MG tablet TAKE ONE TABLET BY MOUTH DAILY AT 9AM and TAKE 1 AND 1/2 TABLETS BY MOUTH DAILY AT 9PM (VIAL) 225 tablet 11   isosorbide mononitrate (IMDUR) 30 MG 24 hr tablet Take 0.5 tablets (15 mg total) by  mouth daily.     levothyroxine (SYNTHROID) 25 MCG tablet TAKE ONE TABLET BY MOUTH DAILY AT 7AM BEFORE BREAKFAST AND 2-3 HOURS BEFORE TAKING ANY OTHER MEDICTION OR SUPPLEMENT 90 tablet 3   nitroGLYCERIN (NITROSTAT) 0.4 MG SL tablet Place 1 tablet (0.4 mg total) under the tongue every 5 (five) minutes as needed for chest pain. 25 tablet 3   OLANZapine (ZYPREXA) 10 MG tablet TAKE 1 TABLET BY MOUTH EVERYDAY AT BEDTIME 90 tablet 0   ondansetron (ZOFRAN-ODT) 4 MG disintegrating tablet Take 1 tablet (4 mg total) by mouth every 6 (six) hours as needed for nausea. 45 tablet 2   OVER THE COUNTER MEDICATION 200 mg daily. Magnesium Citrate Gummy     OVER THE COUNTER MEDICATION Power C 2 gummies daily      pantoprazole (PROTONIX) 40 MG tablet Take 1 tablet (40 mg total) by mouth daily. 90 tablet 1   potassium chloride SA (KLOR-CON M) 20 MEQ tablet TAKE ONE TABLET BY MOUTH DAILY AS NEEDED TAKE WITH LASIX (VIAL) 90 tablet 11   rosuvastatin (CRESTOR) 40 MG tablet TAKE ONE TABLET BY MOUTH DAILY AT 5PM 90 tablet 3   Semaglutide,0.25 or 0.5MG /DOS, (OZEMPIC, 0.25 OR 0.5 MG/DOSE,) 2 MG/3ML SOPN INJECT 0.5 MG INTO THE SKIN ONE TIME PER WEEK 3 mL 2   sucralfate (CARAFATE) 1 g tablet Take 1 tablet (1 g total) by mouth 3 (three) times daily before meals. 90 tablet 2   tobramycin-dexamethasone (TOBRADEX) ophthalmic solution Place 2 drops into the left eye every 6 (six) hours.     Vitamin A 2400 MCG (8000 UT) TABS Take by mouth daily.     vitamin E 180 MG (400 UNITS) capsule Take 400 Units by mouth daily.     vortioxetine HBr (TRINTELLIX) 20 MG TABS tablet Take 20 mg by mouth daily.     Zinc Sulfate (ZINC 15 PO) Take by mouth daily.     No current facility-administered medications on file prior to visit.     ROS see history of present illness  Objective  Physical Exam Vitals:   05/20/23 0836  BP: 124/80  Pulse: 83  Temp: 98.6 F (37 C)  SpO2: 98%    BP Readings from Last 3 Encounters:  05/20/23 124/80  05/07/23 94/61  05/04/23 (!) 142/68   Wt Readings from Last 3 Encounters:  05/20/23 186 lb 6.4 oz (84.6 kg)  05/07/23 187 lb 9.6 oz (85.1 kg)  05/04/23 188 lb (85.3 kg)    Physical Exam Constitutional:      General: She is not in acute distress.    Appearance: She is not diaphoretic.  Cardiovascular:     Rate and Rhythm: Normal rate and regular rhythm.     Heart sounds: Normal heart sounds.  Pulmonary:     Effort: Pulmonary effort is normal.     Breath sounds: Normal breath sounds.  Skin:    General: Skin is warm and dry.  Neurological:     Mental Status: She is alert.     Comments: 5/5 strength in bilateral biceps, triceps, grip, quads, hamstrings, plantar and dorsiflexion,  sensation to light touch reduced left medial and lateral thigh and all distributions below her knee on the left side, reduced light touch sensation is the worst in her left foot, otherwise intact in bilateral UE and right LE, normal gait      Assessment/Plan: Please see individual problem list.  Left leg numbness Assessment & Plan: This has been going on for a few  weeks and has been improving some.  Concerned that this could be related to impingement in her back.  It seems to be less likely related to a central nervous system process given this is relatively focal to her left lower leg.  We will get an MRI lumbar spine.  I have also referred to neurology to consider nerve conduction testing.  Orders: -     Ambulatory referral to Neurology -     MR LUMBAR SPINE WO CONTRAST; Future  Anxiety and depression Assessment & Plan: Chronic issue.  She will continue to see psychiatry.  They are now managing her psychiatric medications.     Return for As scheduled.   [name], MD [facility]

## 2023-05-21 ENCOUNTER — Ambulatory Visit
Admission: RE | Admit: 2023-05-21 | Discharge: 2023-05-21 | Disposition: A | Discharge status: Home / Self Care | Payer: 59 | Source: Ambulatory Visit | Attending: Family Medicine | Admitting: Family Medicine

## 2023-05-21 DIAGNOSIS — M5137 Other intervertebral disc degeneration, lumbosacral region: Secondary | ICD-10-CM | POA: Diagnosis not present

## 2023-05-21 DIAGNOSIS — R200 Anesthesia of skin: Secondary | ICD-10-CM | POA: Diagnosis not present

## 2023-05-21 DIAGNOSIS — M5136 Other intervertebral disc degeneration, lumbar region: Secondary | ICD-10-CM | POA: Diagnosis not present

## 2023-05-21 DIAGNOSIS — M47816 Spondylosis without myelopathy or radiculopathy, lumbar region: Secondary | ICD-10-CM | POA: Diagnosis not present

## 2023-05-21 DIAGNOSIS — M4316 Spondylolisthesis, lumbar region: Secondary | ICD-10-CM | POA: Diagnosis not present

## 2023-05-22 ENCOUNTER — Telehealth: Payer: Self-pay

## 2023-05-22 NOTE — Telephone Encounter (Signed)
Transition Care Management Follow-up Telephone Call Date of discharge and from where: [place] 9/2 How have you been since you were released from the hospital? Patient has been following up with PCP and other providers for her foot Any questions or concerns? No  Items Reviewed: Did the pt receive and understand the discharge instructions provided? Yes  Medications obtained and verified? No  Other? No  Any new allergies since your discharge? No  Dietary orders reviewed? No Do you have support at home? No    Follow up appointments reviewed:  PCP Hospital f/u appt confirmed? Yes  Scheduled to see  on  @ . Specialist Hospital f/u appt confirmed? Yes  Scheduled to see  on  @ . Are transportation arrangements needed? No  If their condition worsens, is the pt aware to call PCP or go to the Emergency Dept.? Yes Was the patient provided with contact information for the PCP's office or ED? Yes Was to pt encouraged to call back with questions or concerns? Yes

## 2023-05-25 ENCOUNTER — Telehealth: Payer: Self-pay | Admitting: Family Medicine

## 2023-05-25 NOTE — Telephone Encounter (Signed)
Called Patient to let her know there is a shortage on radiologists and they are behind on results but when we get them someone will call her with the results.

## 2023-05-25 NOTE — Telephone Encounter (Signed)
Pt called stating she would like to know her MRI results that she did on last week

## 2023-05-26 ENCOUNTER — Telehealth: Payer: Self-pay | Admitting: Family Medicine

## 2023-05-26 NOTE — Telephone Encounter (Signed)
Pt called stating her elquis has not came from [facility] yet and she is need of it. Pt want to know if we can [facility] [place] [facility] because she can not be without it

## 2023-05-27 ENCOUNTER — Telehealth: Payer: Self-pay

## 2023-05-27 MED ORDER — APIXABAN 5 MG PO TABS: 5.0000 mg | ORAL_TABLET | Freq: Two times a day (BID) | ORAL | 1 refills | Status: DC

## 2023-05-27 NOTE — Telephone Encounter (Signed)
Medication Samples have been provided to the patient.  Drug name: Eliquis       Strength: 5 mg         Qty: 28 tablets  LOT: [id number]  Exp.Date: 12/31/2023  Dosing instructions: Take 5 mg of Eliquis 2 x a day  The patient has been instructed regarding the correct time, dose, and frequency of taking this medication, including desired effects and most common side effects.   Francene Boyers 3:27 PM 05/27/2023

## 2023-05-27 NOTE — Telephone Encounter (Signed)
Rx sent to [facility] I have cancelled the one sent to mail order in error & removed pharmacy from chart.

## 2023-05-27 NOTE — Addendum Note (Signed)
Addended by: Glori Luis on: 05/27/2023 01:58 PM   Modules accepted: Orders

## 2023-05-27 NOTE — Telephone Encounter (Signed)
Prescription Request  05/27/2023  LOV: 05/20/2023  What is the name of the medication or equipment?  ELIQUIS 5 MG TABS tablet   Have you contacted your pharmacy to request a refill? Yes   Which pharmacy would you like this sent to?  [id number] [place], [place] - [address] [place] [address] Phone: [phone] Fax: [phone]  Patient notified that their request is being sent to the clinical staff for review and that they should receive a response within 2 business days.   Please advise at Mobile [phone] (mobile)  Patient initially states she is following up to be sure we sent her prescription for ELIQUIS 5 MG TABS tablet to [facility].  Patient states this is her second day without having her blood thinner and patient states she is a high-risk for blood clots.  I do not see that this medication was sent to [facility] in the system.  Patient states she would like [facility] [place].  Patient states this is her second day without Eliquis.  Patient states she will have to go to the ED if she doesn't get this medication soon.

## 2023-05-27 NOTE — Telephone Encounter (Signed)
It looks like [facility] should have had refills to send to her, though since they have not sent them I have sent refills to them. It is ok to give her 2 weeks of samples of eliquis 5 mg BID until she get her supply from the mail order pharmacy. If she does not have this from them in 2 weeks she should let us know so we can cover her with additional samples.

## 2023-05-28 ENCOUNTER — Other Ambulatory Visit: Payer: Self-pay | Admitting: Family Medicine

## 2023-05-28 DIAGNOSIS — E1142 Type 2 diabetes mellitus with diabetic polyneuropathy: Secondary | ICD-10-CM | POA: Diagnosis not present

## 2023-05-28 DIAGNOSIS — E119 Type 2 diabetes mellitus without complications: Secondary | ICD-10-CM

## 2023-05-28 DIAGNOSIS — S82832A Other fracture of upper and lower end of left fibula, initial encounter for closed fracture: Secondary | ICD-10-CM | POA: Diagnosis not present

## 2023-05-29 ENCOUNTER — Other Ambulatory Visit: Payer: Self-pay | Admitting: Family Medicine

## 2023-05-29 DIAGNOSIS — E785 Hyperlipidemia, unspecified: Secondary | ICD-10-CM

## 2023-05-30 ENCOUNTER — Emergency Department
Admission: EM | Admit: 2023-05-30 | Discharge: 2023-05-31 | Disposition: A | Discharge status: Home / Self Care | Payer: 59 | Attending: Emergency Medicine | Admitting: Emergency Medicine

## 2023-05-30 ENCOUNTER — Other Ambulatory Visit: Payer: Self-pay

## 2023-05-30 ENCOUNTER — Emergency Department: Payer: 59

## 2023-05-30 DIAGNOSIS — M79661 Pain in right lower leg: Secondary | ICD-10-CM | POA: Insufficient documentation

## 2023-05-30 DIAGNOSIS — M79604 Pain in right leg: Secondary | ICD-10-CM

## 2023-05-30 DIAGNOSIS — Z7901 Long term (current) use of anticoagulants: Secondary | ICD-10-CM | POA: Insufficient documentation

## 2023-05-30 NOTE — ED Triage Notes (Signed)
Pt reports woke up this morning with right lower leg pain. Pain located behind the knee and calve. 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 Pt talks in complete sentences no respiratory distress noted. In addition, pt states she ran out of her Eliquis on Thursday.

## 2023-05-31 NOTE — ED Provider Notes (Signed)
[facility] Provider Note    Event Date/Time   First MD Initiated Contact with Patient 05/31/23 0009     (approximate)   History   Leg Pain   HPI Valerie Vazquez is a 67 y.o. female who presents for evaluation of pain in her right calf.  This has been going on a few days.  She is concerned because she ran out of Eliquis last week and was without it for a few days and she has a history of DVT and PE.  She has had no recent trauma to the right leg.  Pain seems to be behind her right knee and radiating down into her right calf.  It is mild but worrisome to her given her history.  She is back on Eliquis after coordinating with her PCP, and she has had no significant swelling.  She just wanted to make sure everything is okay.     Physical Exam   Triage Vital Signs: ED Triage Vitals  Encounter Vitals Group     BP 05/30/23 2300 (!) 151/88     Systolic BP Percentile --      Diastolic BP Percentile --      Pulse Rate 05/30/23 2300 (!) 102     Resp 05/30/23 2300 16     Temp 05/30/23 2300 98.8 F (37.1 C)     Temp Source 05/30/23 2300 Oral     SpO2 05/30/23 2300 95 %     Weight 05/30/23 2301 84.4 kg (186 lb)     Height 05/30/23 2301 1.676 m (5\' 6" )     Head Circumference --      Peak Flow --      Pain Score 05/30/23 2300 6     Pain Loc --      Pain Education --      Exclude from Growth Chart --     Most recent vital signs: Vitals:   05/30/23 2300  BP: (!) 151/88  Pulse: (!) 102  Resp: 16  Temp: 98.8 F (37.1 C)  SpO2: 95%    General: Awake, no distress.  CV:  Good peripheral perfusion.  Well-perfused distal extremities bilaterally. Resp:  Normal effort. Speaking easily and comfortably, no accessory muscle usage nor intercostal retractions.   Abd:  No distention.  Other:  No peripheral edema in her right leg compared to the left.  No palpable deformity along the vascular distribution in the right leg.  No evidence of  infection/cellulitis.   ED Results / Procedures / Treatments   Labs (all labs ordered are listed, but only abnormal results are displayed) Labs Reviewed - No data to display   RADIOLOGY I viewed and interpreted the patient's venous Doppler ultrasound and there is no evidence of DVT or Baker's cyst.  I also read the radiologist's report, which confirmed no acute findings.   PROCEDURES:  Critical Care performed: No  Procedures    IMPRESSION / MDM / ASSESSMENT AND PLAN / ED COURSE  I reviewed the triage vital signs and the nursing notes.                              Differential diagnosis includes, but is not limited to, DVT, Baker's cyst, musculoskeletal strain.  Patient's presentation is most consistent with acute presentation with potential threat to life or bodily function.  Labs/studies ordered: Venous ultrasound of right lower extremity  Interventions/Medications given:  Medications - No data  to display  (Note:  hospital course my include additional interventions and/or labs/studies not listed above.)   Reassuring physical exam, borderline tachycardia initially at triage but which resolved over time.  Negative ultrasound, no physical exam findings.  Patient is reassured and comfortable with the plan for discharge and outpatient follow-up for musculoskeletal pain.  I gave my usual follow-up recommendations and return precautions.         FINAL CLINICAL IMPRESSION(S) / ED DIAGNOSES   Final diagnoses:  Right leg pain     Rx / DC Orders   ED Discharge Orders     None        Note:  This document was prepared using Dragon voice recognition software and may include unintentional dictation errors.   Loleta Rose, MD 05/31/23 [phone]

## 2023-05-31 NOTE — Discharge Instructions (Addendum)
Your ultrasound was negative for DVT and did not show a specific reason why your right leg should be hurting.  This is most likely some mild musculoskeletal pain.  Please take over-the-counter Tylenol according to label instructions and follow-up with your regular doctor as needed.

## 2023-06-01 DIAGNOSIS — E039 Hypothyroidism, unspecified: Secondary | ICD-10-CM | POA: Diagnosis not present

## 2023-06-01 DIAGNOSIS — E119 Type 2 diabetes mellitus without complications: Secondary | ICD-10-CM | POA: Diagnosis not present

## 2023-06-03 ENCOUNTER — Telehealth: Payer: Self-pay | Admitting: Family Medicine

## 2023-06-03 ENCOUNTER — Encounter: Payer: Self-pay | Admitting: Family Medicine

## 2023-06-03 NOTE — Telephone Encounter (Signed)
Pt would like to be called regarding her ELIQUIS. Pt stated she does not have enough to last until her next refill which will be 10/17

## 2023-06-04 ENCOUNTER — Telehealth: Payer: Self-pay

## 2023-06-04 NOTE — Telephone Encounter (Signed)
Fixed 2 samples for Patient and called to let her know.

## 2023-06-04 NOTE — Telephone Encounter (Signed)
Medication Samples have been provided to the patient.  Drug name: Eliquis       Strength: 5 mg        Qty: 2 boxes  LOT: [id number]  Exp.Date: 12/01/2023  Dosing instructions: Take 1 tablet 2 times daily  The patient has been instructed regarding the correct time, dose, and frequency of taking this medication, including desired effects and most common side effects.   Francene Boyers 4:07 PM 06/04/2023    Called the Patient to let her know we have 2 samples for her to pick up. Patient states she has to get a ride so it will be 06/05/23.

## 2023-06-04 NOTE — Telephone Encounter (Signed)
The patient can be given 2 weeks of samples for eliquis 5 mg BID. Please document and label them appropriately.

## 2023-06-07 DIAGNOSIS — E119 Type 2 diabetes mellitus without complications: Secondary | ICD-10-CM | POA: Diagnosis not present

## 2023-06-07 DIAGNOSIS — E039 Hypothyroidism, unspecified: Secondary | ICD-10-CM | POA: Diagnosis not present

## 2023-06-08 DIAGNOSIS — E119 Type 2 diabetes mellitus without complications: Secondary | ICD-10-CM | POA: Diagnosis not present

## 2023-06-08 DIAGNOSIS — M84372S Stress fracture, left ankle, sequela: Secondary | ICD-10-CM | POA: Diagnosis not present

## 2023-06-09 ENCOUNTER — Other Ambulatory Visit: Payer: Self-pay | Admitting: Family Medicine

## 2023-06-09 DIAGNOSIS — E063 Autoimmune thyroiditis: Secondary | ICD-10-CM

## 2023-06-09 DIAGNOSIS — M84372S Stress fracture, left ankle, sequela: Secondary | ICD-10-CM | POA: Diagnosis not present

## 2023-06-09 DIAGNOSIS — E119 Type 2 diabetes mellitus without complications: Secondary | ICD-10-CM | POA: Diagnosis not present

## 2023-06-10 ENCOUNTER — Ambulatory Visit: Payer: 59 | Admitting: Neurology

## 2023-06-11 ENCOUNTER — Other Ambulatory Visit: Payer: Self-pay | Admitting: Family Medicine

## 2023-06-11 DIAGNOSIS — M48061 Spinal stenosis, lumbar region without neurogenic claudication: Secondary | ICD-10-CM

## 2023-06-11 DIAGNOSIS — M5136 Other intervertebral disc degeneration, lumbar region with discogenic back pain only: Secondary | ICD-10-CM

## 2023-06-11 DIAGNOSIS — G8929 Other chronic pain: Secondary | ICD-10-CM

## 2023-06-15 ENCOUNTER — Ambulatory Visit: Payer: Self-pay | Admitting: Urology

## 2023-06-15 ENCOUNTER — Ambulatory Visit: Payer: 59 | Admitting: Family Medicine

## 2023-06-16 ENCOUNTER — Encounter: Payer: Self-pay | Admitting: Neurology

## 2023-06-16 ENCOUNTER — Ambulatory Visit (INDEPENDENT_AMBULATORY_CARE_PROVIDER_SITE_OTHER): Payer: 59 | Admitting: Neurology

## 2023-06-16 VITALS — BP 120/72 | HR 80 | Ht 66.0 in | Wt 187.0 lb

## 2023-06-16 DIAGNOSIS — M48062 Spinal stenosis, lumbar region with neurogenic claudication: Secondary | ICD-10-CM | POA: Diagnosis not present

## 2023-06-16 NOTE — Progress Notes (Signed)
[facility] Clinic Note - Initial Visit   Date: 06/16/2023   Valerie Vazquez MRN: [id number] DOB: Jan 16, 1956   Dear Dr. Birdie Sons:  Thank you for your kind referral of Valerie Vazquez for consultation of left leg numbness. Although her history is well known to you, please allow [place] to reiterate it for the purpose of our medical record. The patient was accompanied to the clinic by self.     Valerie Vazquez is a 67 y.o. left-handed female with well-controlled diabetes mellitus, hypothyroidism, DVT/PE, hyperlipidemia, and presenting for evaluation of left leg numbness.   IMPRESSION/PLAN: Spinal canal stenosis with neurogenic claudication causing gait difficulty and left leg numbness.  MRI lumbar spine reviewed and shows severe stenosis at L3-4 and moderate-severe at L4-5.  Exam today shows brisk reflexes in the legs with extensor plantar responses and asymmetrically reduced sensation in the left leg.  With her history of diabetes, she most likely also have overlapping distal neuropathy affecting the feet, but this would not cause left leg symptoms. Agree with neurosurgery referral. She is scheduled to see Dr. Wynetta Emery on 10/31.    ------------------------------------------------------------- History of present illness: She woke up on 9/2 and when she attempted to get out of bed, she was unable to feel the floor with her left leg because of severe numbness from the knee down and suffered a fall. She fractured her left ankle which was treated conservatively.  The numbness intensity gradually became less, but she continues to have numbness involving the left leg and foot.  She has some numbness also in the right foot. She has chronic low back pain and endorses urinary and bowel incontinence which is new.  MRI lumbar spine shows severe spinal canal stenosis L3-4 and moderate to severe at L4-5 and will be seeing Dr. Wynetta Emery on 10/31. She endorses that using a shopping cart  helps ease her back pain.  She also has exertional leg heaviness with prolonged standing and walking.   Out-side paper records, electronic medical record, and images have been reviewed where available and summarized as:  MRI lumbar spine wo contrast 06/10/2023:  T12-L1: No significant disc bulge. No neural foraminal stenosis. No central canal stenosis.   L1-L2: Mild broad-based disc bulge. Moderate bilateral facet arthropathy. No foraminal or central canal stenosis.   L2-L3: Minimal broad-based disc bulge. Severe bilateral facet arthropathy. Moderate spinal stenosis. Right lateral recess stenosis. Mild right foraminal stenosis. No left foraminal stenosis.   L3-L4: Mild broad-based disc bulge. Severe bilateral facet arthropathy. Severe spinal stenosis. Mild right foraminal stenosis. No left foraminal stenosis.   L4-L5: Broad-based disc bulge. Severe bilateral facet arthropathy with ligamentum flavum infolding. Moderate-severe spinal stenosis. Moderate right foraminal stenosis. No left foraminal stenosis. Bilateral lateral recess stenosis.   L5-S1: Mild broad-based disc bulge. Severe bilateral facet arthropathy. No foraminal or central canal stenosis.   IMPRESSION: 1. Diffuse lumbar spine spondylosis as described above. 2. No acute osseous injury of the lumbar spine.  Lab Results  Component Value Date   HGBA1C 6.0 (H) 03/13/2023   Lab Results  Component Value Date   VITAMINB12 794 08/27/2021   Lab Results  Component Value Date   TSH 1.27 03/13/2023   Lab Results  Component Value Date   ESRSEDRATE 23 04/01/2016    Past Medical History:  Diagnosis Date   Acute right-sided low back pain with right-sided sciatica 10/23/2017   Anemia    Anginal pain (HCC)    Bilateral ovarian cysts    Bilateral  renal cysts    Bulimia nervosa 05/07/2021   CAD (coronary artery disease) 09/28/2016   Chest pain 10/14/2015   Chronic back pain    "upper and lower" (09/19/2014)   Chronic  shoulder pain (Location of Primary Source of Pain) (Left) 01/29/2016   Colitis 04/08/2016   Colon polyp 01/30/2017   Coronary vasospasm (HCC) 12/09/2014   DDD (degenerative disc disease), cervical    a.) s/p ACDF C5-C7   Depression    DVT (deep venous thrombosis) (HCC)    Esophageal spasm    Essential hypertension 10/28/2013   Gallstones    Gastritis    Gastroenteritis    GERD (gastroesophageal reflux disease)    Heart disease 12/09/2016   High blood pressure    High cholesterol    History of blood transfusion 1985   related to hysterectomy   History of left heart catheterization (LHC) 12/09/2014   a.) LHC 12/09/2014: EF 50%; normal coronaries.   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 Rule out Paget's disease of the bone with blood work.    Hyperlipidemia 10/28/2013   Hypertension    Hypothyroidism    IBS (irritable bowel syndrome)    Long term current use of anticoagulant    a.) apixaban   Lumbar central spinal stenosis (moderate at L4-5; mild at L2-3 and L3-4) 01/29/2016   Migraine    "a few times/yr" (09/19/2014)   Myocardial infarction (HCC) 10/2010 X 3   "while hospitalized"   Neck pain 01/29/2016   Osteoarthritis    Pleural effusion, bilateral 07/31/2015   Pneumonia 04/2014   PONV (postoperative nausea and vomiting)    PTSD (post-traumatic stress disorder)    Pulmonary embolism (HCC) 04/2001; 09/19/2014   "after gallbladder OR; "   Schizoaffective disorder, depressive type (HCC) 08/28/2016   Sleep apnea    "mild" (09/19/2014)   Snoring    a. sleep study 5/16: No OSA   SOB (shortness of breath) 07/31/2015   Spinal stenosis    Spondylosis    Suicidal ideation 08/27/2016   a.) seen in ED with approx 6 months of reported SI. Reported ingestion of "unknown pills" to end life;  last ingestion was 3 days PTA; UDS (+) for TCAs. SI related to depression, friend (loss of friend), and social determinants   Type II diabetes mellitus (HCC)    "dx'd in 2007; lost weight; no RX for ~ 4 yr now" (09/19/2014)    Past Surgical History:  Procedure Laterality Date   ABDOMINAL HYSTERECTOMY  1985   ANTERIOR CERVICAL DECOMP/DISCECTOMY FUSION  10/2012   in [place] C5-C7    APPENDECTOMY  1985   BACK SURGERY     BREAST CYST EXCISION Right    BREAST EXCISIONAL BIOPSY Right YRS AGO   NEG   CARDIAC CATHETERIZATION   2000's; 2009   CHOLECYSTECTOMY  2002   COLONOSCOPY WITH PROPOFOL N/A 12/12/2016   Procedure: COLONOSCOPY WITH PROPOFOL;  Surgeon: Wyline Mood, MD;  Location: [facility] ENDOSCOPY;  Service: Endoscopy;  Laterality: N/A;   COLONOSCOPY WITH PROPOFOL N/A 02/17/2017   Procedure: COLONOSCOPY WITH PROPOFOL;  Surgeon: Wyline Mood, MD;  Location: [facility] ENDOSCOPY;  Service: Gastroenterology;  Laterality: N/A;   COLONOSCOPY WITH PROPOFOL N/A 11/04/2021   Procedure: COLONOSCOPY WITH PROPOFOL;  Surgeon: Wyline Mood, MD;  Location: [facility]  ENDOSCOPY;  Service: Gastroenterology;  Laterality: N/A;   CYSTOSCOPY N/A 05/15/2022   Procedure: CYSTOSCOPY;  Surgeon: Conard Novak, MD;  Location: [facility] ORS;  Service: Gynecology;  Laterality: N/A;   ESOPHAGOGASTRODUODENOSCOPY (EGD) WITH PROPOFOL N/A 02/17/2017   Procedure: ESOPHAGOGASTRODUODENOSCOPY (EGD) WITH PROPOFOL;  Surgeon: Wyline Mood, MD;  Location: [facility] ENDOSCOPY;  Service: Gastroenterology;  Laterality: N/A;   ESOPHAGOGASTRODUODENOSCOPY (EGD) WITH PROPOFOL N/A 03/07/2019   Procedure: ESOPHAGOGASTRODUODENOSCOPY (EGD) WITH PROPOFOL;  Surgeon: Wyline Mood, MD;  Location: [facility] ENDOSCOPY;  Service: Gastroenterology;  Laterality: N/A;   EXCISION/RELEASE BURSA HIP Right 12/1984   HERNIA REPAIR  1985   LEFT HEART CATHETERIZATION WITH CORONARY ANGIOGRAM N/A 12/09/2014   Procedure: LEFT HEART CATHETERIZATION WITH CORONARY ANGIOGRAM;  Surgeon: Kathleene Hazel, MD;  Location: [facility] CATH LAB;  Service: Cardiovascular;  Laterality: N/A;   ROBOTIC ASSISTED BILATERAL SALPINGO OOPHERECTOMY Bilateral 05/15/2022   Procedure: XI ROBOTIC ASSISTED BILATERAL OOPHORECTOMY;  Surgeon: Conard Novak, MD;  Location: [facility] ORS;  Service: Gynecology;  Laterality: Bilateral;   TONSILLECTOMY AND ADENOIDECTOMY  ~ 1968   TOTAL HIP ARTHROPLASTY Left 04-06-2014   UMBILICAL HERNIA REPAIR  1985   VENA CAVA FILTER PLACEMENT  03/2014     Medications:  Outpatient Encounter Medications as of 06/16/2023  Medication Sig Note   acetaminophen (TYLENOL) 500 MG tablet Take 1 tablet (500 mg total) by mouth every 6 (six) hours as needed.    apixaban (ELIQUIS) 5 MG TABS tablet Take 1 tablet (5 mg total) by mouth 2 (two) times daily.    baclofen (LIORESAL) 10 MG tablet TAKE ONE TABLET BY MOUTH THREE TIMES DAILY AS NEEDED FOR MUSCLE SPASMS (VIAL)    blood glucose meter kit and supplies KIT Dispense based on patient and insurance preference. Use once daily fasting. Dx code E11.9.    Blood Glucose Monitoring Suppl (GHT BLOOD GLUCOSE MONITOR) w/Device KIT DISPENSE BASED ON PATIENT AND INSURANCE PREFERENCE. USE ONCE DAILY AS DIRECTED. DX CODE E11.9.    dicyclomine (BENTYL) 20 MG tablet TAKE ONE TABLET BY MOUTH THREE TIMES DAILY AS NEEDED FOR ABDOMINAL SPASMS (VIAL)    escitalopram (LEXAPRO) 20 MG tablet TAKE ONE TABLET BY MOUTH DAILY AT 9AM    fluticasone (FLONASE) 50 MCG/ACT nasal spray SPRAY 2 SPRAYS INTO EACH NOSTRIL EVERY DAY    furosemide (LASIX) 20 MG tablet TAKE ONE TABLET BY MOUTH DAILY AS NEEDED FOR EDEMA (VIAL)    gabapentin (NEURONTIN) 600 MG tablet TAKE ONE TABLET BY MOUTH DAILY AT 9AM and TAKE 1 AND 1/2 TABLETS BY MOUTH DAILY AT 9PM (VIAL)    isosorbide mononitrate (IMDUR) 30 MG 24 hr tablet Take 0.5 tablets (15 mg total) by mouth daily.    JARDIANCE 25 MG TABS tablet TAKE 1 TABLET (25 MG TOTAL) BY MOUTH DAILY.    levothyroxine (SYNTHROID) 25 MCG tablet TAKE ONE TABLET  BY MOUTH DAILY AT 7AM BEFORE BREAKFAST AND 2-3 HOURS BEFORE TAKING ANY OTHER MEDICATION OR SUPPLEMENT    OLANZapine (ZYPREXA) 10 MG tablet TAKE 1 TABLET BY MOUTH EVERYDAY AT BEDTIME    ondansetron (ZOFRAN-ODT) 4 MG disintegrating tablet Take 1 tablet (4 mg total) by mouth every 6 (six) hours as needed for nausea.    OVER THE COUNTER MEDICATION 200 mg daily. Magnesium Citrate Gummy    OVER THE COUNTER MEDICATION Power C 2 gummies daily 02/09/2023: Takes 1 gummy daily    pantoprazole (PROTONIX) 40 MG tablet Take 1 tablet (40 mg total) by mouth daily.    potassium chloride SA (KLOR-CON  M) 20 MEQ tablet TAKE ONE TABLET BY MOUTH DAILY AS NEEDED TAKE WITH LASIX (VIAL)    rosuvastatin (CRESTOR) 40 MG tablet TAKE 1 TABLET BY MOUTH EVERY DAY    Semaglutide,0.25 or 0.5MG /DOS, (OZEMPIC, 0.25 OR 0.5 MG/DOSE,) 2 MG/3ML SOPN INJECT 0.5 MG INTO THE SKIN ONE TIME PER WEEK    sucralfate (CARAFATE) 1 g tablet Take 1 tablet (1 g total) by mouth 3 (three) times daily before meals.    tobramycin-dexamethasone (TOBRADEX) ophthalmic solution Place 2 drops into the left eye every 6 (six) hours.    Zinc Sulfate (ZINC 15 PO) Take by mouth daily.    b complex vitamins capsule Take 1 capsule by mouth daily. (Patient not taking: Reported on 06/16/2023)    nitroGLYCERIN (NITROSTAT) 0.4 MG SL tablet Place 1 tablet (0.4 mg total) under the tongue every 5 (five) minutes as needed for chest pain. (Patient not taking: Reported on 06/16/2023)    Vitamin A 2400 MCG (8000 UT) TABS Take by mouth daily. (Patient not taking: Reported on 06/16/2023)    vitamin E 180 MG (400 UNITS) capsule Take 400 Units by mouth daily. (Patient not taking: Reported on 06/16/2023)    vortioxetine HBr (TRINTELLIX) 20 MG TABS tablet Take 20 mg by mouth daily. (Patient not taking: Reported on 06/16/2023)    No facility-administered encounter medications on file as of 06/16/2023.    Allergies:  Allergies  Allergen Reactions   Abilify [Aripiprazole] Other  (See Comments)    Jerking movement, slow motor skills   Augmentin [Amoxicillin-Pot Clavulanate] Diarrhea and Nausea And Vomiting   Bactrim [Sulfamethoxazole-Trimethoprim] Diarrhea   Ceftin [Cefuroxime Axetil] Diarrhea   Coconut (Cocos Nucifera)     Rash    Diclofenac Other (See Comments)     Muscle Pain   Dye Fdc Red [Red Dye #40 (Allura Red)]     "hair dye"   Indocin [Indomethacin] Other (See Comments)    Migraines    Linaclotide Diarrhea   Lisinopril Diarrhea and Other (See Comments)    Other reaction(s): Dizziness, Vomiting   Morphine Other (See Comments)    Chest Tightness   Morphine And Codeine Other (See Comments)    Chest tightness    Simvastatin Other (See Comments)    Muscle Pain   Sulfa Antibiotics Diarrhea   Sulfamethoxazole-Trimethoprim Diarrhea   Wellbutrin [Bupropion] Other (See Comments)    Jerking movement Jerking movement Slurred speech   Zetia [Ezetimibe]     Diarrhea    Quetiapine Fumarate Palpitations   Quetiapine Fumarate Palpitations    Family History: Family History  Problem Relation Age of Onset   Stroke Mother        Deceased   Lung cancer Father        Deceased   Healthy Daughter    Healthy Son        x 2   Other Son        Suicide   Cancer Brother    Heart attack Neg Hx     Social History: Social History   Tobacco Use   Smoking status: Former    Current packs/day: 0.00    Average packs/day: 1.5 packs/day for 10.0 years (15.0 ttl pk-yrs)    Types: Cigarettes    Start date: 04/11/1969    Quit date: 04/12/1979    Years since quitting: 44.2   Smokeless tobacco: Never  Vaping Use   Vaping status: Never Used  Substance Use Topics   Alcohol use: No    Alcohol/week: 0.0 standard  drinks of alcohol   Drug use: No   Social History   Social History Narrative   Lives with fiancee in an apartment on the first floor.  Has 3 grown children.  One child passed away.  On disability 08-02-1995 - 2000, 2007 - current.     Education: some  college.   Caffeine rarely   retired    Vital Signs:  BP 120/72   Pulse 80   Ht 5\' 6"  (1.676 m)   Wt 187 lb (84.8 kg)   SpO2 97%   BMI 30.18 kg/m    Neurological Exam: MENTAL STATUS including orientation to time, place, person, recent and remote memory, attention span and concentration, language, and fund of knowledge is normal.  Speech is not dysarthric.  CRANIAL NERVES: II:  No visual field defects.     III-IV-VI: Pupils equal round and reactive to light.  Normal conjugate, extra-ocular eye movements in all directions of gaze.  No nystagmus.  No ptosis.   V:  Normal facial sensation.    VII:  Normal facial symmetry and movements.   VIII:  Normal hearing and vestibular function.   IX-X:  Normal palatal movement.   XI:  Normal shoulder shrug and head rotation.   XII:  Normal tongue strength and range of motion, no deviation or fasciculation.  MOTOR:  No atrophy, fasciculations or abnormal movements.  No pronator drift.   Upper Extremity:  Right  Left  Deltoid  5/5   5/5   Biceps  5/5   5/5   Triceps  5/5   5/5   Wrist extensors  5/5   5/5   Wrist flexors  5/5   5/5   Finger extensors  5/5   5/5   Finger flexors  5/5   5/5   Dorsal interossei  5/5   5/5   Abductor pollicis  5/5   5/5   Tone (Ashworth scale)  0  0   Lower Extremity:  Right  Left  Hip flexors  5/5   5/5   Knee flexors  5/5   5/5   Knee extensors  5/5   5/5   Dorsiflexors  5/5   5/5   Plantarflexors  5/5   5/5   Toe extensors  5/5   5/5   Toe flexors  5/5   5/5   Tone (Ashworth scale)  0  0   MSRs:                                           Right        Left brachioradialis 2+  2+  biceps 2+  2+  triceps 2+  2+  patellar 3+  3+  ankle jerk 2+  2+  Hoffman no  no  plantar response up  up  No ankle clonus  SENSORY:  Vibration is absent at the left ankle and bilateral great toes.  Temperature and pin prick reduced over the mid-lower leg on the left and bilateral feet.  COORDINATION/GAIT:  Normal finger-to- nose-finger.  Intact rapid alternating movements bilaterally.  Gait is stooped, slow, stable.     Thank you for allowing me to participate in patient's care.  If I can answer any additional questions, I would be pleased to do so.    Sincerely,    Gracyn Santillanes K. Allena Katz, DO

## 2023-06-19 ENCOUNTER — Encounter: Payer: Self-pay | Admitting: Family Medicine

## 2023-06-19 ENCOUNTER — Ambulatory Visit (INDEPENDENT_AMBULATORY_CARE_PROVIDER_SITE_OTHER): Payer: 59 | Admitting: Family Medicine

## 2023-06-19 ENCOUNTER — Telehealth: Payer: Self-pay | Admitting: Family Medicine

## 2023-06-19 VITALS — BP 114/66 | HR 84 | Temp 98.5°F | Ht 66.0 in | Wt 188.6 lb

## 2023-06-19 DIAGNOSIS — E119 Type 2 diabetes mellitus without complications: Secondary | ICD-10-CM | POA: Diagnosis not present

## 2023-06-19 DIAGNOSIS — Z7984 Long term (current) use of oral hypoglycemic drugs: Secondary | ICD-10-CM | POA: Diagnosis not present

## 2023-06-19 DIAGNOSIS — Z7985 Long-term (current) use of injectable non-insulin antidiabetic drugs: Secondary | ICD-10-CM | POA: Diagnosis not present

## 2023-06-19 DIAGNOSIS — M545 Low back pain, unspecified: Secondary | ICD-10-CM

## 2023-06-19 DIAGNOSIS — G8929 Other chronic pain: Secondary | ICD-10-CM

## 2023-06-19 NOTE — Assessment & Plan Note (Signed)
Chronic issue.  Patient is having neurogenic claudication symptoms.  She needs to see her neurosurgeon as planned.  Discussed if she has any progressive symptoms to the point where it becomes difficult to function or if she has suddenly progressive symptoms she needs to go to the emergency department immediately.

## 2023-06-19 NOTE — Telephone Encounter (Signed)
Patient is scheduled for 06/29/23

## 2023-06-19 NOTE — Patient Instructions (Signed)
Nice to see you. Please make sure you see the neurosurgeon as planned. If you have any suddenly progressive symptoms or your symptoms get to the point where you are having difficulty functioning you need to go to the emergency department.

## 2023-06-19 NOTE — Assessment & Plan Note (Addendum)
Chronic issue.  Seems to be adequately controlled based on home CBGs.  Unable to collect A1c today due to some error with the machine or sample.  Patient will be called to get this rescheduled.  Continue Jardiance 25 mg daily and Ozempic 0.5 mg weekly.

## 2023-06-19 NOTE — Progress Notes (Signed)
Valerie Alar, MD Phone: [phone]  Valerie Vazquez is a 67 y.o. female who presents today for follow-up.  Diabetes: Typically 89-1 14.  Taking Jardiance and Ozempic.  Some polyuria.  No hypoglycemia.  Chronic low back pain: Patient continues to have back pain.  Notes the numbness she was having in her legs has been improving.  She saw neurology recently and they felt as though her symptoms were related to spinal stenosis.  Patient sees neurosurgery at the end of this month.  She notes some difficulty walking and feels like her ankles will give out on her.  She notes some bowel and bladder incontinence that has been going on intermittently for quite some time though has gotten worse since she fell in early September.  Patient did have MRI lumbar spine that did reveal spinal stenosis and some foraminal stenosis as well as well as arthritis.  Social History   Tobacco Use  Smoking Status Former   Current packs/day: 0.00   Average packs/day: 1.5 packs/day for 10.0 years (15.0 ttl pk-yrs)   Types: Cigarettes   Start date: 04/11/1969   Quit date: 04/12/1979   Years since quitting: 44.2  Smokeless Tobacco Never    Current Outpatient Medications on File Prior to Visit  Medication Sig Dispense Refill   acetaminophen (TYLENOL) 500 MG tablet Take 1 tablet (500 mg total) by mouth every 6 (six) hours as needed. 30 tablet 0   apixaban (ELIQUIS) 5 MG TABS tablet Take 1 tablet (5 mg total) by mouth 2 (two) times daily. 180 tablet 1   b complex vitamins capsule Take 1 capsule by mouth daily.     baclofen (LIORESAL) 10 MG tablet TAKE ONE TABLET BY MOUTH THREE TIMES DAILY AS NEEDED FOR MUSCLE SPASMS (VIAL) 60 tablet 11   blood glucose meter kit and supplies KIT Dispense based on patient and insurance preference. Use once daily fasting. Dx code E11.9. 1 each 0   Blood Glucose Monitoring Suppl (GHT BLOOD GLUCOSE MONITOR) w/Device KIT DISPENSE BASED ON PATIENT AND INSURANCE PREFERENCE. USE ONCE DAILY AS  DIRECTED. DX CODE E11.9. 1 kit 0   dicyclomine (BENTYL) 20 MG tablet TAKE ONE TABLET BY MOUTH THREE TIMES DAILY AS NEEDED FOR ABDOMINAL SPASMS (VIAL) 270 tablet 0   escitalopram (LEXAPRO) 20 MG tablet TAKE ONE TABLET BY MOUTH DAILY AT 9AM 90 tablet 0   fluticasone (FLONASE) 50 MCG/ACT nasal spray SPRAY 2 SPRAYS INTO EACH NOSTRIL EVERY DAY 48 mL 2   furosemide (LASIX) 20 MG tablet TAKE ONE TABLET BY MOUTH DAILY AS NEEDED FOR EDEMA (VIAL) 90 tablet 3   gabapentin (NEURONTIN) 600 MG tablet TAKE ONE TABLET BY MOUTH DAILY AT 9AM and TAKE 1 AND 1/2 TABLETS BY MOUTH DAILY AT 9PM (VIAL) 225 tablet 11   isosorbide mononitrate (IMDUR) 30 MG 24 hr tablet Take 0.5 tablets (15 mg total) by mouth daily.     JARDIANCE 25 MG TABS tablet TAKE 1 TABLET (25 MG TOTAL) BY MOUTH DAILY. 90 tablet 3   levothyroxine (SYNTHROID) 25 MCG tablet TAKE ONE TABLET BY MOUTH DAILY AT 7AM BEFORE BREAKFAST AND 2-3 HOURS BEFORE TAKING ANY OTHER MEDICATION OR SUPPLEMENT 90 tablet 11   nitroGLYCERIN (NITROSTAT) 0.4 MG SL tablet Place 1 tablet (0.4 mg total) under the tongue every 5 (five) minutes as needed for chest pain. 25 tablet 3   OLANZapine (ZYPREXA) 10 MG tablet TAKE 1 TABLET BY MOUTH EVERYDAY AT BEDTIME 90 tablet 0   ondansetron (ZOFRAN-ODT) 4 MG disintegrating tablet Take  1 tablet (4 mg total) by mouth every 6 (six) hours as needed for nausea. 45 tablet 2   OVER THE COUNTER MEDICATION 200 mg daily. Magnesium Citrate Gummy     OVER THE COUNTER MEDICATION Power C 2 gummies daily     pantoprazole (PROTONIX) 40 MG tablet Take 1 tablet (40 mg total) by mouth daily. 90 tablet 1   potassium chloride SA (KLOR-CON M) 20 MEQ tablet TAKE ONE TABLET BY MOUTH DAILY AS NEEDED TAKE WITH LASIX (VIAL) 90 tablet 11   rosuvastatin (CRESTOR) 40 MG tablet TAKE 1 TABLET BY MOUTH EVERY DAY 90 tablet 1   Semaglutide,0.25 or 0.5MG /DOS, (OZEMPIC, 0.25 OR 0.5 MG/DOSE,) 2 MG/3ML SOPN INJECT 0.5 MG INTO THE SKIN ONE TIME PER WEEK 3 mL 2   sucralfate  (CARAFATE) 1 g tablet Take 1 tablet (1 g total) by mouth 3 (three) times daily before meals. 90 tablet 2   tobramycin-dexamethasone (TOBRADEX) ophthalmic solution Place 2 drops into the left eye every 6 (six) hours.     Vitamin A 2400 MCG (8000 UT) TABS Take by mouth daily.     vitamin E 180 MG (400 UNITS) capsule Take 400 Units by mouth daily.     vortioxetine HBr (TRINTELLIX) 20 MG TABS tablet Take 20 mg by mouth daily.     Zinc Sulfate (ZINC 15 PO) Take by mouth daily.     No current facility-administered medications on file prior to visit.     ROS see history of present illness  Objective  Physical Exam Vitals:   06/19/23 0956  BP: 114/66  Pulse: 84  Temp: 98.5 F (36.9 C)  SpO2: 96%    BP Readings from Last 3 Encounters:  06/19/23 114/66  06/16/23 120/72  05/31/23 (!) 163/91   Wt Readings from Last 3 Encounters:  06/19/23 188 lb 9.6 oz (85.5 kg)  06/16/23 187 lb (84.8 kg)  05/30/23 186 lb (84.4 kg)    Physical Exam Constitutional:      General: She is not in acute distress.    Appearance: She is not diaphoretic.  Cardiovascular:     Rate and Rhythm: Normal rate and regular rhythm.     Heart sounds: Normal heart sounds.  Pulmonary:     Effort: Pulmonary effort is normal.     Breath sounds: Normal breath sounds.  Skin:    General: Skin is warm and dry.  Neurological:     Mental Status: She is alert.     Comments: 5/5 strength in bilateral biceps, triceps, grip, quads, hamstrings, plantar and dorsiflexion, sensation to light touch intact in bilateral UE and LE      Assessment/Plan: Please see individual problem list.  Diabetes mellitus without complication (HCC) Assessment & Plan: Chronic issue.  Seems to be adequately controlled based on home CBGs.  Unable to collect A1c today due to some error with the machine or sample.  Patient will be called to get this rescheduled.  Continue Jardiance 25 mg daily and Ozempic 0.5 mg weekly.  Orders: -     POCT  glycosylated hemoglobin (Hb A1C); Future  Chronic bilateral low back pain, unspecified whether sciatica present Assessment & Plan: Chronic issue.  Patient is having neurogenic claudication symptoms.  She needs to see her neurosurgeon as planned.  Discussed if she has any progressive symptoms to the point where it becomes difficult to function or if she has suddenly progressive symptoms she needs to go to the emergency department immediately.     Return in  about 3 months (around 09/19/2023) for DM, back pain.   Valerie Alar, MD [facility]

## 2023-06-19 NOTE — Telephone Encounter (Signed)
Please call the patient and let her know that we were not able to run her A1c due to some error with the machine.  Please get her scheduled for nurse visit to have a point-of-care A1c drawn.  Thanks.

## 2023-06-23 DIAGNOSIS — M4316 Spondylolisthesis, lumbar region: Secondary | ICD-10-CM | POA: Diagnosis not present

## 2023-06-23 DIAGNOSIS — M47816 Spondylosis without myelopathy or radiculopathy, lumbar region: Secondary | ICD-10-CM | POA: Diagnosis not present

## 2023-06-24 ENCOUNTER — Encounter: Payer: Self-pay | Admitting: Family Medicine

## 2023-06-24 ENCOUNTER — Telehealth: Payer: Self-pay | Admitting: *Deleted

## 2023-06-24 ENCOUNTER — Telehealth: Payer: Self-pay | Admitting: Cardiology

## 2023-06-24 ENCOUNTER — Encounter: Payer: Self-pay | Admitting: Cardiology

## 2023-06-24 ENCOUNTER — Telehealth: Payer: Self-pay

## 2023-06-24 NOTE — Telephone Encounter (Signed)
Pt has been scheduled for tele pre op appt 07/08/23. Med rec and consent are done.     Patient Consent for Virtual Visit        AILSA MASSAQUOI has provided verbal consent on 06/24/2023 for a virtual visit (video or telephone).   CONSENT FOR VIRTUAL VISIT FOR:  Doreatha Lew Gauthier  By participating in this virtual visit I agree to the following:  I hereby voluntarily request, consent and authorize [facility] and its employed or contracted physicians, physician assistants, nurse practitioners or other licensed health care professionals (the Practitioner), to provide me with telemedicine health care services (the "Services") as deemed necessary by the treating Practitioner. I acknowledge and consent to receive the Services by the Practitioner via telemedicine. I understand that the telemedicine visit will involve communicating with the Practitioner through live audiovisual communication technology and the disclosure of certain medical information by electronic transmission. I acknowledge that I have been given the opportunity to request an in-person assessment or other available alternative prior to the telemedicine visit and am voluntarily participating in the telemedicine visit.  I understand that I have the right to withhold or withdraw my consent to the use of telemedicine in the course of my care at any time, without affecting my right to future care or treatment, and that the Practitioner or I may terminate the telemedicine visit at any time. I understand that I have the right to inspect all information obtained and/or recorded in the course of the telemedicine visit and may receive copies of available information for a reasonable fee.  I understand that some of the potential risks of receiving the Services via telemedicine include:  Delay or interruption in medical evaluation due to technological equipment failure or disruption; Information transmitted may not be sufficient (e.g. poor  resolution of images) to allow for appropriate medical decision making by the Practitioner; and/or  In rare instances, security protocols could fail, causing a breach of personal health information.  Furthermore, I acknowledge that it is my responsibility to provide information about my medical history, conditions and care that is complete and accurate to the best of my ability. I acknowledge that Practitioner's advice, recommendations, and/or decision may be based on factors not within their control, such as incomplete or inaccurate data provided by me or distortions of diagnostic images or specimens that may result from electronic transmissions. I understand that the practice of medicine is not an exact science and that Practitioner makes no warranties or guarantees regarding treatment outcomes. I acknowledge that a copy of this consent can be made available to me via my patient portal [facility] MyChart), or I can request a printed copy by calling the office of [facility].    I understand that my insurance will be billed for this visit.   I have read or had this consent read to me. I understand the contents of this consent, which adequately explains the benefits and risks of the Services being provided via telemedicine.  I have been provided ample opportunity to ask questions regarding this consent and the Services and have had my questions answered to my satisfaction. I give my informed consent for the services to be provided through the use of telemedicine in my medical care

## 2023-06-24 NOTE — Telephone Encounter (Signed)
Pt has been scheduled for tele pre op appt 07/08/23. Med rec and consent are done.

## 2023-06-24 NOTE — Telephone Encounter (Signed)
Medical records requested to [facility] for all medical records

## 2023-06-24 NOTE — Telephone Encounter (Signed)
Name: Valerie Vazquez  DOB: [birth year]  MRN: [id number]  Primary Cardiologist: Debbe Odea, MD   Preoperative team, please contact this patient and set up a phone call appointment for further preoperative risk assessment. Please obtain consent and complete medication review. Thank you for your help.  I confirm that guidance regarding antiplatelet and oral anticoagulation therapy has been completed and, if necessary, noted below.  Patient's Eliquis is not prescribed by cardiology.  Recommendations for holding Eliquis will need to come from prescribing provider.  I also confirmed the patient resides in the state of [place]. As per [facility] Medical Board telemedicine laws, the patient must reside in the state in which the provider is licensed.   Ronney Asters, NP 06/24/2023, 2:25 PM [facility]

## 2023-06-24 NOTE — Telephone Encounter (Signed)
Pre-operative Risk Assessment    Patient Name: Valerie Vazquez  DOB: Nov 03, 1955 MRN: [id number]      Request for Surgical Clearance    Procedure:  Lumbar Fusion, L3-4, L4-5  Date of Surgery:  Clearance TBD                                 Surgeon:  Donalee Citrin Surgeon's Group or Practice Name:  [facility] Phone number:  [phone] Fax number:  [phone]   Type of Clearance Requested:  Medical  { 5. What type of anesthesia will be used?  Press F2 and select the anesthesia to be used for the procedure.  :1  Type of Anesthesia:  General    Additional requests/questions:    Signed, Narda Amber   06/24/2023, 1:28 PM

## 2023-06-25 ENCOUNTER — Telehealth: Payer: Self-pay | Admitting: Cardiology

## 2023-06-25 NOTE — Telephone Encounter (Signed)
Spoke with patient and she wanted to switch to a IN OFFICE visit so that she could receive her clearance sooner. Patient has been scheduled to see Dr. Azucena Cecil on 06/26/23.

## 2023-06-25 NOTE — Telephone Encounter (Signed)
Pt would like a sooner appt than 11/06 for pre-op appt

## 2023-06-26 ENCOUNTER — Ambulatory Visit: Payer: 59 | Attending: Cardiology | Admitting: Cardiology

## 2023-06-26 VITALS — BP 100/64 | HR 68 | Ht 66.0 in | Wt 187.0 lb

## 2023-06-26 DIAGNOSIS — I10 Essential (primary) hypertension: Secondary | ICD-10-CM | POA: Diagnosis not present

## 2023-06-26 DIAGNOSIS — E7800 Pure hypercholesterolemia, unspecified: Secondary | ICD-10-CM | POA: Diagnosis not present

## 2023-06-26 DIAGNOSIS — Z01810 Encounter for preprocedural cardiovascular examination: Secondary | ICD-10-CM | POA: Diagnosis not present

## 2023-06-26 DIAGNOSIS — I201 Angina pectoris with documented spasm: Secondary | ICD-10-CM

## 2023-06-26 MED ORDER — NITROGLYCERIN 0.4 MG SL SUBL: 0.4000 mg | SUBLINGUAL_TABLET | SUBLINGUAL | 3 refills | Status: AC | PRN

## 2023-06-26 NOTE — Patient Instructions (Signed)
Medication Instructions:   Your physician recommends that you continue on your current medications as directed. Please refer to the Current Medication list given to you today.  *If you need a refill on your cardiac medications before your next appointment, please call your pharmacy*   Lab Work:  None Ordered  If you have labs (blood work) drawn today and your tests are completely normal, you will receive your results only by: MyChart Message (if you have MyChart) OR A paper copy in the mail If you have any lab test that is abnormal or we need to change your treatment, we will call you to review the results.   Testing/Procedures:  None Ordered    Follow-Up: At [facility], you and your health needs are our priority.  As part of our continuing mission to provide you with exceptional heart care, we have created designated Provider Care Teams.  These Care Teams include your primary Cardiologist (physician) and Advanced Practice Providers (APPs -  Physician Assistants and Nurse Practitioners) who all work together to provide you with the care you need, when you need it.  We recommend signing up for the patient portal called "MyChart".  Sign up information is provided on this After Visit Summary.  MyChart is used to connect with patients for Virtual Visits (Telemedicine).  Patients are able to view lab/test results, encounter notes, upcoming appointments, etc.  Non-urgent messages can be sent to your provider as well.   To learn more about what you can do with MyChart, go to [email or web address]://www.mychart.com.    Your next appointment:   12 month(s)  Provider:   You may see Brian Agbor-Etang, MD or one of the following Advanced Practice Providers on your designated Care Team:   Christopher Berge, NP Ryan Dunn, PA-C Cadence Furth, PA-C Sheri Hammock, NP  

## 2023-06-26 NOTE — Progress Notes (Signed)
Cardiology Office Note:    Date:  06/26/2023   ID:  Valerie Vazquez, DOB 02-07-1956, MRN [id number]  PCP:  Glori Luis, MD   [facility]  Cardiologist:  Debbe Odea, MD  Advanced Practice Provider:  No care team member to display Electrophysiologist:  None       Referring MD: Glori Luis, MD   Chief Complaint  Patient presents with   Follow-up    Pre op , spine surgery pending cardiac clearance Dr Wynetta Emery, no cardiac issues    History of Present Illness:    Valerie Vazquez is a 67 y.o. female with a hx of Schizoaffective disorder, diabetes, coronary vasospasm,  hypertension, history of DVT on Eliquis who presents for preoperative eval.  Patient has lumbar bulging disc disease, involving L3-L4, L4-L5, compressing her spinal cord.  Lumbar fusion procedure being planned.  Last seen with irregular heartbeats, cardiac monitor showed no significant arrhythmias.  Previous echo and [employer] showed normal systolic function, no evidence for ischemia.  Denies chest pain.  Prior notes  Lexiscan 07/2020 low risk study, no evidence for ischemia, EF 55 to 65% Echocardiogram 03/2019, normal systolic function, EF 60 to 65%, impaired relaxation.  Past Medical History:  Diagnosis Date   Acute right-sided low back pain with right-sided sciatica 10/23/2017   Anemia    Anginal pain (HCC)    Bilateral ovarian cysts    Bilateral renal cysts    Bulimia nervosa 05/07/2021   CAD (coronary artery disease) 09/28/2016   Chest pain 10/14/2015   Chronic back pain    "upper and lower" (09/19/2014)   Chronic shoulder pain (Location of Primary Source of Pain) (Left) 01/29/2016   Colitis 04/08/2016   Colon polyp 01/30/2017   Coronary vasospasm (HCC) 12/09/2014   DDD (degenerative disc disease), cervical    a.) s/p ACDF C5-C7   Depression    DVT (deep venous thrombosis) (HCC)    Esophageal spasm    Essential hypertension 10/28/2013   Gallstones     Gastritis    Gastroenteritis    GERD (gastroesophageal reflux disease)    Heart disease 12/09/2016   High blood pressure    High cholesterol    History of blood transfusion 1985   related to hysterectomy   History of left heart catheterization (LHC) 12/09/2014   a.) LHC 12/09/2014: EF 50%; normal coronaries.   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 Rule out Paget's disease of the bone with blood work.    Hyperlipidemia 10/28/2013   Hypertension    Hypothyroidism    IBS (irritable bowel syndrome)    Long term current use of anticoagulant    a.) apixaban   Lumbar central spinal stenosis (moderate at L4-5; mild at L2-3 and L3-4) 01/29/2016   Migraine    "a few times/yr" (09/19/2014)   Myocardial infarction (HCC) 10/2010 X 3   "while hospitalized"   Neck pain 01/29/2016   Osteoarthritis    Pleural effusion, bilateral 07/31/2015   Pneumonia 04/2014   PONV (postoperative nausea and vomiting)    PTSD (post-traumatic stress disorder)    Pulmonary embolism (HCC) 04/2001; 09/19/2014   "after gallbladder OR; "   Schizoaffective disorder, depressive type (HCC) 08/28/2016   Sleep apnea    "mild" (09/19/2014)  Snoring    a. sleep study 5/16: No OSA   SOB (shortness of breath) 07/31/2015   Spinal stenosis    Spondylosis    Suicidal ideation 08/27/2016   a.) seen in ED with approx 6 months of reported SI. Reported ingestion of "unknown pills" to end life; last ingestion was 3 days PTA; UDS (+) for TCAs. SI related to depression, friend (loss of friend), and social determinants   Type II diabetes mellitus (HCC)    "dx'd in 2007; lost weight; no RX for ~ 4 yr now" (09/19/2014)    Past Surgical History:  Procedure Laterality Date   ABDOMINAL HYSTERECTOMY  1985   ANTERIOR CERVICAL DECOMP/DISCECTOMY  FUSION  10/2012   in [place] C5-C7    APPENDECTOMY  1985   BACK SURGERY     BREAST CYST EXCISION Right    BREAST EXCISIONAL BIOPSY Right YRS AGO   NEG   CARDIAC CATHETERIZATION   2000's; 2009   CHOLECYSTECTOMY  2002   COLONOSCOPY WITH PROPOFOL N/A 12/12/2016   Procedure: COLONOSCOPY WITH PROPOFOL;  Surgeon: Wyline Mood, MD;  Location: [facility] ENDOSCOPY;  Service: Endoscopy;  Laterality: N/A;   COLONOSCOPY WITH PROPOFOL N/A 02/17/2017   Procedure: COLONOSCOPY WITH PROPOFOL;  Surgeon: Wyline Mood, MD;  Location: [facility] ENDOSCOPY;  Service: Gastroenterology;  Laterality: N/A;   COLONOSCOPY WITH PROPOFOL N/A 11/04/2021   Procedure: COLONOSCOPY WITH PROPOFOL;  Surgeon: Wyline Mood, MD;  Location: [facility] ENDOSCOPY;  Service: Gastroenterology;  Laterality: N/A;   CYSTOSCOPY N/A 05/15/2022   Procedure: CYSTOSCOPY;  Surgeon: Conard Novak, MD;  Location: [facility] ORS;  Service: Gynecology;  Laterality: N/A;   ESOPHAGOGASTRODUODENOSCOPY (EGD) WITH PROPOFOL N/A 02/17/2017   Procedure: ESOPHAGOGASTRODUODENOSCOPY (EGD) WITH PROPOFOL;  Surgeon: Wyline Mood, MD;  Location: [facility] ENDOSCOPY;  Service: Gastroenterology;  Laterality: N/A;   ESOPHAGOGASTRODUODENOSCOPY (EGD) WITH PROPOFOL N/A 03/07/2019   Procedure: ESOPHAGOGASTRODUODENOSCOPY (EGD) WITH PROPOFOL;  Surgeon: Wyline Mood, MD;  Location: [facility] ENDOSCOPY;  Service: Gastroenterology;  Laterality: N/A;   EXCISION/RELEASE BURSA HIP Right 12/1984   HERNIA REPAIR  1985   LEFT HEART CATHETERIZATION WITH CORONARY ANGIOGRAM N/A 12/09/2014   Procedure: LEFT HEART CATHETERIZATION WITH CORONARY ANGIOGRAM;  Surgeon: Kathleene Hazel, MD;  Location: [facility] CATH LAB;  Service: Cardiovascular;  Laterality: N/A;   ROBOTIC ASSISTED BILATERAL SALPINGO OOPHERECTOMY Bilateral 05/15/2022   Procedure: XI ROBOTIC ASSISTED BILATERAL OOPHORECTOMY;  Surgeon: Conard Novak, MD;  Location: [facility] ORS;  Service: Gynecology;  Laterality: Bilateral;   TONSILLECTOMY AND ADENOIDECTOMY  ~ 1968    TOTAL HIP ARTHROPLASTY Left 04-06-2014   UMBILICAL HERNIA REPAIR  1985   VENA CAVA FILTER PLACEMENT  03/2014    Current Medications: Current Meds  Medication Sig   acetaminophen (TYLENOL) 500 MG tablet Take 1 tablet (500 mg total) by mouth every 6 (six) hours as needed.   apixaban (ELIQUIS) 5 MG TABS tablet Take 1 tablet (5 mg total) by mouth 2 (two) times daily.   baclofen (LIORESAL) 10 MG tablet TAKE ONE TABLET BY MOUTH THREE TIMES DAILY AS NEEDED FOR MUSCLE SPASMS (VIAL)   blood glucose meter kit and supplies KIT Dispense based on patient and insurance preference. Use once daily fasting. Dx code E11.9.   Blood Glucose Monitoring Suppl (GHT BLOOD GLUCOSE MONITOR) w/Device KIT DISPENSE BASED ON PATIENT AND INSURANCE PREFERENCE. USE ONCE DAILY AS DIRECTED. DX CODE E11.9.   dicyclomine (BENTYL) 20 MG tablet TAKE ONE TABLET BY MOUTH THREE TIMES DAILY AS NEEDED FOR ABDOMINAL SPASMS (VIAL)   escitalopram (LEXAPRO) 20 MG  tablet TAKE ONE TABLET BY MOUTH DAILY AT 9AM   fluticasone (FLONASE) 50 MCG/ACT nasal spray SPRAY 2 SPRAYS INTO EACH NOSTRIL EVERY DAY   furosemide (LASIX) 20 MG tablet TAKE ONE TABLET BY MOUTH DAILY AS NEEDED FOR EDEMA (VIAL)   gabapentin (NEURONTIN) 600 MG tablet TAKE ONE TABLET BY MOUTH DAILY AT 9AM and TAKE 1 AND 1/2 TABLETS BY MOUTH DAILY AT 9PM (VIAL)   isosorbide mononitrate (IMDUR) 30 MG 24 hr tablet Take 0.5 tablets (15 mg total) by mouth daily.   JARDIANCE 25 MG TABS tablet TAKE 1 TABLET (25 MG TOTAL) BY MOUTH DAILY.   levothyroxine (SYNTHROID) 25 MCG tablet TAKE ONE TABLET BY MOUTH DAILY AT 7AM BEFORE BREAKFAST AND 2-3 HOURS BEFORE TAKING ANY OTHER MEDICATION OR SUPPLEMENT   OLANZapine (ZYPREXA) 10 MG tablet TAKE 1 TABLET BY MOUTH EVERYDAY AT BEDTIME   ondansetron (ZOFRAN-ODT) 4 MG disintegrating tablet Take 1 tablet (4 mg total) by mouth every 6 (six) hours as needed for nausea.   OVER THE COUNTER MEDICATION 200 mg daily. Magnesium Citrate Gummy   OVER THE COUNTER  MEDICATION Power C 2 gummies daily   pantoprazole (PROTONIX) 40 MG tablet Take 1 tablet (40 mg total) by mouth daily.   potassium chloride SA (KLOR-CON M) 20 MEQ tablet TAKE ONE TABLET BY MOUTH DAILY AS NEEDED TAKE WITH LASIX (VIAL)   rosuvastatin (CRESTOR) 40 MG tablet TAKE 1 TABLET BY MOUTH EVERY DAY   Semaglutide,0.25 or 0.5MG /DOS, (OZEMPIC, 0.25 OR 0.5 MG/DOSE,) 2 MG/3ML SOPN INJECT 0.5 MG INTO THE SKIN ONE TIME PER WEEK   sucralfate (CARAFATE) 1 g tablet Take 1 tablet (1 g total) by mouth 3 (three) times daily before meals.   tobramycin-dexamethasone (TOBRADEX) ophthalmic solution Place 2 drops into the left eye every 6 (six) hours.   Vitamin A 2400 MCG (8000 UT) TABS Take by mouth daily.   Zinc Sulfate (ZINC 15 PO) Take by mouth daily.   [DISCONTINUED] nitroGLYCERIN (NITROSTAT) 0.4 MG SL tablet Place 1 tablet (0.4 mg total) under the tongue every 5 (five) minutes as needed for chest pain.     Allergies:   Abilify [aripiprazole], Augmentin [amoxicillin-pot clavulanate], Bactrim [sulfamethoxazole-trimethoprim], Ceftin [cefuroxime axetil], Coconut (cocos nucifera), Diclofenac, Dye fdc red [red dye #40 (allura red)], Indocin [indomethacin], Linaclotide, Lisinopril, Morphine, Morphine and codeine, Simvastatin, Sulfa antibiotics, Sulfamethoxazole-trimethoprim, Wellbutrin [bupropion], Zetia [ezetimibe], Quetiapine fumarate, and Quetiapine fumarate   Social History   Socioeconomic History   Marital status: Divorced    Spouse name: Not on file   Number of children: Not on file   Years of education: Not on file   Highest education level: Associate degree: occupational, Scientist, product/process development, or vocational program  Occupational History   Not on file  Tobacco Use   Smoking status: Former    Current packs/day: 0.00    Average packs/day: 1.5 packs/day for 10.0 years (15.0 ttl pk-yrs)    Types: Cigarettes    Start date: 04/11/1969    Quit date: 04/12/1979    Years since quitting: 44.2   Smokeless tobacco:  Never  Vaping Use   Vaping status: Never Used  Substance and Sexual Activity   Alcohol use: No    Alcohol/week: 0.0 standard drinks of alcohol   Drug use: No   Sexual activity: Not Currently  Other Topics Concern   Not on file  Social History Narrative   Lives with fiancee in an apartment on the first floor.  Has 3 grown children.  One child passed away.  On disability  1996 - 2000, 2007 - current.     Education: some college.   Caffeine rarely   retired   Chemical engineer Strain: High Risk (06/11/2023)   Overall Financial Resource Strain (CARDIA)    Difficulty of Paying Living Expenses: Very hard  Food Insecurity: Food Insecurity Present (06/11/2023)   Hunger Vital Sign    Worried About Running Out of Food in the Last Year: Sometimes true    Ran Out of Food in the Last Year: Sometimes true  Transportation Needs: No Transportation Needs (06/11/2023)   PRAPARE - Administrator, Civil Service (Medical): No    Lack of Transportation (Non-Medical): No  Recent Concern: Transportation Needs - Unmet Transportation Needs (05/19/2023)   PRAPARE - Transportation    Lack of Transportation (Medical): Yes    Lack of Transportation (Non-Medical): Yes  Physical Activity: Inactive (06/11/2023)   Exercise Vital Sign    Days of Exercise per Week: 0 days    Minutes of Exercise per Session: 0 min  Stress: No Stress Concern Present (06/11/2023)   [employer] of Occupational Health - Occupational Stress Questionnaire    Feeling of Stress : Only a little  Social Connections: Moderately Integrated (06/11/2023)   Social Connection and Isolation Panel [NHANES]    Frequency of Communication with Friends and Family: More than three times a week    Frequency of Social Gatherings with Friends and Family: More than three times a week    Attends Religious Services: More than 4 times per year    Active Member of [employer] or Organizations: Yes    Attends Probation officer: More than 4 times per year    Marital Status: Divorced     Family History: The patient's family history includes Cancer in her brother; Healthy in her daughter and son; Lung cancer in her father; Other in her son; Stroke in her mother. There is no history of Heart attack.  ROS:   Please see the history of present illness.     All other systems reviewed and are negative.  EKGs/Labs/Other Studies Reviewed:    The following studies were reviewed today:   EKG Interpretation Date/Time:  Friday June 26 2023 14:23:52 EDT Ventricular Rate:  68 PR Interval:  144 QRS Duration:  82 QT Interval:  418 QTC Calculation: 444 R Axis:   -5  Text Interpretation: Normal sinus rhythm Possible Left atrial enlargement T wave abnormality, consider lateral ischemia Confirmed by Debbe Odea ([id number]) on 06/26/2023 2:31:15 PM    Recent Labs: 03/13/2023: BUN 5; Creat 0.91; Hemoglobin 12.3; Platelets 258; Potassium 4.1; Sodium 145; TSH 1.27 04/22/2023: ALT 33  Recent Lipid Panel    Component Value Date/Time   CHOL 127 03/13/2023 1443   TRIG 91 03/13/2023 1443   HDL 42 (L) 03/13/2023 1443   CHOLHDL 3.0 03/13/2023 1443   VLDL 21.0 11/27/2021 1032   LDLCALC 68 03/13/2023 1443   LDLDIRECT 108.0 03/17/2022 1422     Risk Assessment/Calculations:      Physical Exam:    VS:  BP 100/64 (BP Location: Left Arm, Patient Position: Sitting, Cuff Size: Normal)   Pulse 68   Ht 5\' 6"  (1.676 m)   Wt 187 lb (84.8 kg)   SpO2 98%   BMI 30.18 kg/m     Wt Readings from Last 3 Encounters:  06/26/23 187 lb (84.8 kg)  06/19/23 188 lb 9.6 oz (85.5 kg)  06/16/23 187 lb (84.8 kg)  GEN:  Well nourished, well developed in no acute distress HEENT: Normal NECK: No JVD; No carotid bruits CARDIAC: RRR, no murmurs, rubs, gallops RESPIRATORY:  Clear to auscultation without rales, wheezing or rhonchi  ABDOMEN: Soft, non-tender, non-distended MUSCULOSKELETAL:  trace edema; No  deformity  SKIN: Warm and dry NEUROLOGIC:  Alert and oriented x 3 PSYCHIATRIC:  Normal affect   ASSESSMENT:    1. Pre-operative cardiovascular examination   2. Primary hypertension   3. Pure hypercholesterolemia   4. Coronary vasospasm (HCC)    PLAN:    In order of problems listed above:  Preop evaluation, lumbar fusion procedure being planned.  Last echo and Lexiscan Myoview showed normal EF, no ischemia.  Patient asymptomatic from a cardiac perspective.  Okay for procedure.  No additional testing required/indicated at this time. Hypertension, BP controlled.  Continue Imdur 15 mg daily.  Continue Lasix as needed. Hyperlipidemia, continue Crestor 40 mg daily. History of coronary vasospasm.  Continue Crestor, Imdur 15 mg daily.  No anginal symptoms.   Follow-up in 1 year.   Medication Adjustments/Labs and Tests Ordered: Current medicines are reviewed at length with the patient today.  Concerns regarding medicines are outlined above.  Orders Placed This Encounter  Procedures   EKG 12-Lead    Meds ordered this encounter  Medications   nitroGLYCERIN (NITROSTAT) 0.4 MG SL tablet    Sig: Place 1 tablet (0.4 mg total) under the tongue every 5 (five) minutes as needed for chest pain.    Dispense:  25 tablet    Refill:  3     Patient Instructions  Medication Instructions:   Your physician recommends that you continue on your current medications as directed. Please refer to the Current Medication list given to you today.  *If you need a refill on your cardiac medications before your next appointment, please call your pharmacy*   Lab Work:  None Ordered  If you have labs (blood work) drawn today and your tests are completely normal, you will receive your results only by: MyChart Message (if you have MyChart) OR A paper copy in the mail If you have any lab test that is abnormal or we need to change your treatment, we will call you to review the  results.   Testing/Procedures:  None Ordered   Follow-Up: At [facility], you and your health needs are our priority.  As part of our continuing mission to provide you with exceptional heart care, we have created designated Provider Care Teams.  These Care Teams include your primary Cardiologist (physician) and Advanced Practice Providers (APPs -  Physician Assistants and Nurse Practitioners) who all work together to provide you with the care you need, when you need it.  We recommend signing up for the patient portal called "MyChart".  Sign up information is provided on this After Visit Summary.  MyChart is used to connect with patients for Virtual Visits (Telemedicine).  Patients are able to view lab/test results, encounter notes, upcoming appointments, etc.  Non-urgent messages can be sent to your provider as well.   To learn more about what you can do with MyChart, go to [email or web address].    Your next appointment:   12 month(s)  Provider:   You may see Debbe Odea, MD or one of the following Advanced Practice Providers on your designated Care Team:   Nicolasa Ducking, NP Eula Listen, PA-C Cadence Fransico Michael, PA-C Charlsie Quest, NP    Signed, Debbe Odea, MD  06/26/2023 3:14 PM    [facility]

## 2023-06-29 ENCOUNTER — Ambulatory Visit (INDEPENDENT_AMBULATORY_CARE_PROVIDER_SITE_OTHER): Payer: 59

## 2023-06-29 DIAGNOSIS — E119 Type 2 diabetes mellitus without complications: Secondary | ICD-10-CM | POA: Diagnosis not present

## 2023-06-29 LAB — POCT GLYCOSYLATED HEMOGLOBIN (HGB A1C)
HbA1c POC (<> result, manual entry): 5.6 % (ref 4.0–5.6)
HbA1c, POC (controlled diabetic range): 5.6 % (ref 0.0–7.0)
HbA1c, POC (prediabetic range): 5.6 % — AB (ref 5.7–6.4)
Hemoglobin A1C: 5.6 % (ref 4.0–5.6)

## 2023-06-29 NOTE — Progress Notes (Signed)
Pt presented to have A1C checked. Pt was identified through two identifiers. Explained to the pt the steps I would take and got consent to move forward. Pts A1C was 5.6%. Pt was informed that Dr. Birdie Sons would be made aware and would have his MA contact her with any next steps or advice. Pt then stated she wanted to know if PCP received her PreOp forms from her Neurosurgeon.    Art the time I could not find PCP's MA to find out if she knew I then informed pt I would send a note to her PCP and when I see his MA I would ask her.    Pt verbalized understanding and was let go.

## 2023-06-29 NOTE — Telephone Encounter (Signed)
Medical clearance from [facility] is in your needs to be signed basket.

## 2023-06-30 NOTE — Telephone Encounter (Signed)
Form reviewed.  ACS NSQIP surgical risk calculator used to determine patient is low risk for surgical complications with the exception of UTI.  Patient does have some risk of coming off of her Eliquis given her history of DVT and PE.  Patient should be advised that she needs to seek medical attention if she develops leg swelling, chest pain, or shortness of breath with coming off of the Eliquis.  Letter will be created to send with the risk assessment paperwork.  Given that this is a spine surgery patient will hold her Eliquis 2 days prior to the surgery date and also hold it on the surgery date.  Given that she needs to be off of anticoagulation for 2 full days prior to her surgery date there is no need to bridge with Lovenox.  Patient can resume the Eliquis once the surgeon deems it is appropriate to resume after surgery given potential for bleeding complications with this kind of surgery.  Form has been filled out.

## 2023-07-01 NOTE — Telephone Encounter (Signed)
Patient calling in to see if she is cleared. Please advise

## 2023-07-02 ENCOUNTER — Encounter: Payer: Self-pay | Admitting: Cardiology

## 2023-07-02 DIAGNOSIS — E119 Type 2 diabetes mellitus without complications: Secondary | ICD-10-CM | POA: Diagnosis not present

## 2023-07-02 DIAGNOSIS — E039 Hypothyroidism, unspecified: Secondary | ICD-10-CM | POA: Diagnosis not present

## 2023-07-02 NOTE — Telephone Encounter (Signed)
Patient underwent preoperative evaluation with Dr. Azucena Cecil on 06/26/2023. Routed office visit note addressing clearance to requesting office. Will remove from pre-op pool.   Valerie Vazquez. Valerie Duerson, DNP, NP-C  07/02/2023, 11:07 AM [facility] [address] Office [phone] Fax [phone]

## 2023-07-02 NOTE — Telephone Encounter (Signed)
Form and office notes faxed to [facility] [phone].  Received faxed confirmation.  Placed in scan.

## 2023-07-03 HISTORY — PX: SPINE SURGERY: SHX786

## 2023-07-06 ENCOUNTER — Other Ambulatory Visit: Payer: Self-pay | Admitting: Neurosurgery

## 2023-07-06 ENCOUNTER — Other Ambulatory Visit: Payer: Self-pay | Admitting: Family Medicine

## 2023-07-06 ENCOUNTER — Telehealth: Payer: Self-pay | Admitting: Family Medicine

## 2023-07-06 DIAGNOSIS — E119 Type 2 diabetes mellitus without complications: Secondary | ICD-10-CM

## 2023-07-06 NOTE — Progress Notes (Unsigned)
Celso Amy, PA-C [address]  [address]  [place], [place] [address]  Main: [phone]  Fax: [phone]   Primary Care Physician: Glori Luis, MD  Primary Gastroenterologist:  Celso Amy, PA-C / Dr. Wyline Mood    CC: F/U GERD, gastritis, IBS  HPI: Valerie Vazquez is a 67 y.o. female  presents for f/u of chronic epigastric pain, nausea, acid reflux, gastritis, and irritable bowel syndrome.  She has past history of diarrhea and constipation.  Diarrhea improves if she takes dicyclomine.  Patient has had these chronic GI symptoms for many years.  Episodes of nausea with occasional vomiting.  Take Zofran as needed.  2 months ago she was switched to pantoprazole 40 Mg once a daily and added Carafate 1 g 3 times daily.  Continue dicyclomine and try low FODMAP diet.  Her upper abdominal epigastric pain has resolved on Carafate and pantoprazole.  If she misses 1 Carafate, then she has epigastric pain after eating.  As long as she takes her medicine, she has no epigastric pain.  Bowel movements are currently normal and regular.  She is scheduled to have back surgery 07/27/2023, Dr. Wynetta Emery.   -Colonoscopy 02/17/17 - 6 mm serrated adenoma resected. CT abdomen in 09/2016 showed no abnormalities of the liver .  -Colonoscopy 10/2021: 10mm adenoma cecal polyp; 7mm adenoma sigmoid polyp.  3 year repeat (due 10/2024). -02/16/2019: H pylori stool antigen negative -03/07/2019: EGD: gastric bx - Chronic gastritis; NO h. Pylori. -S/p cholecystectomy in 2002. -09/19/2020 : Abd / Pelvic CT scan: for hematuria work up and was found to incidentally have Mild wall thickening with adjacent inflammation involving the sigmoid colon, which may represent a mild infectious or inflammatory colitis. -RUQ Abd Ultrasound 04/2023: Cholecystectomy, normal common bile duct 3.5 mm, normal liver.  Current Outpatient Medications  Medication Sig Dispense Refill   acetaminophen (TYLENOL) 500 MG tablet Take 1 tablet  (500 mg total) by mouth every 6 (six) hours as needed. 30 tablet 0   apixaban (ELIQUIS) 5 MG TABS tablet Take 1 tablet (5 mg total) by mouth 2 (two) times daily. 180 tablet 1   b complex vitamins capsule Take 1 capsule by mouth daily.     baclofen (LIORESAL) 10 MG tablet TAKE ONE TABLET BY MOUTH THREE TIMES DAILY AS NEEDED FOR MUSCLE SPASMS (VIAL) 60 tablet 11   blood glucose meter kit and supplies KIT Dispense based on patient and insurance preference. Use once daily fasting. Dx code E11.9. 1 each 0   Blood Glucose Monitoring Suppl (GHT BLOOD GLUCOSE MONITOR) w/Device KIT DISPENSE BASED ON PATIENT AND INSURANCE PREFERENCE. USE ONCE DAILY AS DIRECTED. DX CODE E11.9. 1 kit 0   dicyclomine (BENTYL) 20 MG tablet TAKE ONE TABLET BY MOUTH THREE TIMES DAILY AS NEEDED FOR ABDOMINAL SPASMS (VIAL) 270 tablet 0   escitalopram (LEXAPRO) 20 MG tablet TAKE ONE TABLET BY MOUTH DAILY AT 9AM 90 tablet 0   fluticasone (FLONASE) 50 MCG/ACT nasal spray SPRAY 2 SPRAYS INTO EACH NOSTRIL EVERY DAY 48 mL 2   furosemide (LASIX) 20 MG tablet TAKE ONE TABLET BY MOUTH DAILY AS NEEDED FOR EDEMA (VIAL) 90 tablet 3   gabapentin (NEURONTIN) 600 MG tablet TAKE ONE TABLET BY MOUTH DAILY AT 9AM and TAKE 1 AND 1/2 TABLETS BY MOUTH DAILY AT 9PM (VIAL) 225 tablet 11   isosorbide mononitrate (IMDUR) 30 MG 24 hr tablet Take 0.5 tablets (15 mg total) by mouth daily.     JARDIANCE 25 MG TABS tablet  TAKE 1 TABLET (25 MG TOTAL) BY MOUTH DAILY. 90 tablet 3   levothyroxine (SYNTHROID) 25 MCG tablet TAKE ONE TABLET BY MOUTH DAILY AT 7AM BEFORE BREAKFAST AND 2-3 HOURS BEFORE TAKING ANY OTHER MEDICATION OR SUPPLEMENT 90 tablet 11   nitroGLYCERIN (NITROSTAT) 0.4 MG SL tablet Place 1 tablet (0.4 mg total) under the tongue every 5 (five) minutes as needed for chest pain. 25 tablet 3   OLANZapine (ZYPREXA) 10 MG tablet TAKE 1 TABLET BY MOUTH EVERYDAY AT BEDTIME 90 tablet 0   ondansetron (ZOFRAN-ODT) 4 MG disintegrating tablet Take 1 tablet (4 mg  total) by mouth every 6 (six) hours as needed for nausea. 45 tablet 2   OVER THE COUNTER MEDICATION 200 mg daily. Magnesium Citrate Gummy     OVER THE COUNTER MEDICATION Power C 2 gummies daily     pantoprazole (PROTONIX) 40 MG tablet Take 1 tablet (40 mg total) by mouth daily. 90 tablet 1   potassium chloride SA (KLOR-CON M) 20 MEQ tablet TAKE ONE TABLET BY MOUTH DAILY AS NEEDED TAKE WITH LASIX (VIAL) 90 tablet 11   rosuvastatin (CRESTOR) 40 MG tablet TAKE 1 TABLET BY MOUTH EVERY DAY 90 tablet 1   Semaglutide,0.25 or 0.5MG /DOS, (OZEMPIC, 0.25 OR 0.5 MG/DOSE,) 2 MG/1.5ML SOPN Inject into the skin.     Semaglutide,0.25 or 0.5MG /DOS, (OZEMPIC, 0.25 OR 0.5 MG/DOSE,) 2 MG/3ML SOPN INJECT 0.5 MG INTO THE SKIN ONE TIME PER WEEK 3 mL 2   sucralfate (CARAFATE) 1 g tablet Take 1 tablet (1 g total) by mouth 3 (three) times daily before meals. 90 tablet 2   tobramycin-dexamethasone (TOBRADEX) ophthalmic solution Place 2 drops into the left eye every 6 (six) hours.     Vitamin A 2400 MCG (8000 UT) TABS Take by mouth daily.     vitamin E 180 MG (400 UNITS) capsule Take 400 Units by mouth daily.     vortioxetine HBr (TRINTELLIX) 20 MG TABS tablet Take 20 mg by mouth daily.     Zinc Sulfate (ZINC 15 PO) Take by mouth daily.     No current facility-administered medications for this visit.    Allergies as of 07/07/2023 - Review Complete 07/07/2023  Allergen Reaction Noted   Abilify [aripiprazole] Other (See Comments) 09/20/2013   Augmentin [amoxicillin-pot clavulanate] Diarrhea and Nausea And Vomiting 09/20/2013   Bactrim [sulfamethoxazole-trimethoprim] Diarrhea 09/20/2013   Ceftin [cefuroxime axetil] Diarrhea 09/20/2013   Coconut (cocos nucifera)  10/19/2015   Diclofenac Other (See Comments) 08/01/2014   Dye fdc red [red dye #40 (allura red)]  01/22/2016   Indocin [indomethacin] Other (See Comments) 09/20/2013   Linaclotide Diarrhea 12/09/2014   Lisinopril Diarrhea and Other (See Comments) 09/20/2013    Morphine Other (See Comments) 12/09/2014   Morphine and codeine Other (See Comments) 09/20/2013   Simvastatin Other (See Comments) 08/01/2014   Sulfa antibiotics Diarrhea 12/09/2014   Sulfamethoxazole-trimethoprim Diarrhea 12/09/2014   Wellbutrin [bupropion] Other (See Comments) 09/20/2013   Zetia [ezetimibe]  09/18/2017   Quetiapine fumarate Palpitations 08/01/2014   Quetiapine fumarate Palpitations 08/16/2014    Past Medical History:  Diagnosis Date   Acute right-sided low back pain with right-sided sciatica 10/23/2017   Anemia    Anginal pain (HCC)    Bilateral ovarian cysts    Bilateral renal cysts    Bulimia nervosa 05/07/2021   CAD (coronary artery disease) 09/28/2016   Chest pain 10/14/2015   Chronic back pain    "upper and lower" (09/19/2014)   Chronic shoulder pain (Location of Primary Source  of Pain) (Left) 01/29/2016   Colitis 04/08/2016   Colon polyp 01/30/2017   Coronary vasospasm (HCC) 12/09/2014   DDD (degenerative disc disease), cervical    a.) s/p ACDF C5-C7   Depression    DVT (deep venous thrombosis) (HCC)    Esophageal spasm    Essential hypertension 10/28/2013   Gallstones    Gastritis    Gastroenteritis    GERD (gastroesophageal reflux disease)    Heart disease 12/09/2016   High blood pressure    High cholesterol    History of blood transfusion 1985   related to hysterectomy   History of left heart catheterization (LHC) 12/09/2014   a.) LHC 12/09/2014: EF 50%; normal coronaries.   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 Rule out Paget's disease of the bone with blood work.    Hyperlipidemia 10/28/2013   Hypertension    Hypothyroidism    IBS (irritable bowel syndrome)    Long term current use of anticoagulant    a.) apixaban   Lumbar central spinal  stenosis (moderate at L4-5; mild at L2-3 and L3-4) 01/29/2016   Migraine    "a few times/yr" (09/19/2014)   Myocardial infarction (HCC) 10/2010 X 3   "while hospitalized"   Neck pain 01/29/2016   Osteoarthritis    Pleural effusion, bilateral 07/31/2015   Pneumonia 04/2014   PONV (postoperative nausea and vomiting)    PTSD (post-traumatic stress disorder)    Pulmonary embolism (HCC) 04/2001; 09/19/2014   "after gallbladder OR; "   Schizoaffective disorder, depressive type (HCC) 08/28/2016   Sleep apnea    "mild" (09/19/2014)   Snoring    a. sleep study 5/16: No OSA   SOB (shortness of breath) 07/31/2015   Spinal stenosis    Spondylosis    Suicidal ideation 08/27/2016   a.) seen in ED with approx 6 months of reported SI. Reported ingestion of "unknown pills" to end life; last ingestion was 3 days PTA; UDS (+) for TCAs. SI related to depression, friend (loss of friend), and social determinants   Type II diabetes mellitus (HCC)    "dx'd in 2007; lost weight; no RX for ~ 4 yr now" (09/19/2014)    Past Surgical History:  Procedure Laterality Date   ABDOMINAL HYSTERECTOMY  1985   ANTERIOR CERVICAL DECOMP/DISCECTOMY FUSION  10/2012   in [place] C5-C7    APPENDECTOMY  1985   BACK SURGERY     BREAST CYST EXCISION Right    BREAST EXCISIONAL BIOPSY Right YRS AGO   NEG   CARDIAC CATHETERIZATION   2000's; 2009   CHOLECYSTECTOMY  2002   COLONOSCOPY WITH PROPOFOL N/A 12/12/2016   Procedure: COLONOSCOPY WITH PROPOFOL;  Surgeon: Wyline Mood, MD;  Location: [facility] ENDOSCOPY;  Service: Endoscopy;  Laterality: N/A;   COLONOSCOPY WITH PROPOFOL N/A 02/17/2017   Procedure: COLONOSCOPY WITH PROPOFOL;  Surgeon: Wyline Mood, MD;  Location: [facility] ENDOSCOPY;  Service: Gastroenterology;  Laterality: N/A;   COLONOSCOPY WITH PROPOFOL N/A 11/04/2021   Procedure: COLONOSCOPY WITH PROPOFOL;  Surgeon: Wyline Mood, MD;  Location: [facility] ENDOSCOPY;  Service: Gastroenterology;  Laterality: N/A;   CYSTOSCOPY N/A 05/15/2022    Procedure: CYSTOSCOPY;  Surgeon: Conard Novak, MD;  Location: [facility] ORS;  Service: Gynecology;  Laterality: N/A;   ESOPHAGOGASTRODUODENOSCOPY (EGD) WITH PROPOFOL N/A 02/17/2017  Procedure: ESOPHAGOGASTRODUODENOSCOPY (EGD) WITH PROPOFOL;  Surgeon: Wyline Mood, MD;  Location: [facility] ENDOSCOPY;  Service: Gastroenterology;  Laterality: N/A;   ESOPHAGOGASTRODUODENOSCOPY (EGD) WITH PROPOFOL N/A 03/07/2019   Procedure: ESOPHAGOGASTRODUODENOSCOPY (EGD) WITH PROPOFOL;  Surgeon: Wyline Mood, MD;  Location[facility] END[facility]COPY;  Service: Gastroenterology;  Laterality: N/A;   EXCISION/RELEASE BURSA HIP Right 12/1984   HERNIA REPAIR  1985   LEFT HEART CATHETERIZATION WITH CORONARY ANGIOGRAM N/A 12/09/2014   Procedure: LEFT HEART CATHETERIZATION WITH CORONARY ANGIOGRAM;  Surgeon: Kathleene Hazel, MD;  Location: [facility] CATH LAB;  Service: Cardiovascular;  Laterality: N/A;   ROBOTIC ASSISTED BILATERAL SALPINGO OOPHERECTOMY Bilateral 05/15/2022   Procedure: XI ROBOTIC ASSISTED BILATERAL OOPHORECTOMY;  Surgeon: Conard Novak, MD;  Location: [facility] ORS;  Service: Gynecology;  Laterality: Bilateral;   TONSILLECTOMY AND ADENOIDECTOMY  ~ 1968   TOTAL HIP ARTHROPLASTY Left 04-06-2014   UMBILICAL HERNIA REPAIR  1985   VENA CAVA FILTER PLACEMENT  03/2014    Review of Systems:    All systems reviewed and negative except where noted in HPI.   Physical Examination:   BP 103/69   Pulse 91   Temp 99.4 F (37.4 C)   Ht 5\' 6"  (1.676 m)   Wt 188 lb 6.4 oz (85.5 kg)   BMI 30.41 kg/m   General: Well-nourished, well-developed in no acute distress.  Neuro: Alert and oriented x 3.  Walks with a walker. Psych: Alert and cooperative, normal mood and affect.  Imaging Studies: No results found.  Assessment and Plan:   KEALEY KEMMER is a 67 y.o. y/o female returns for 24-month follow-up of chronic GI symptoms including GERD, gastritis, irritable bowel syndrome with diarrhea and constipation.  He has hemorrhoids are  greatly improved on current treatment.  1.  GERD  Continue pantoprazole 40 Mg once daily  Avoid GERD trigger foods  2.  Chronic gastritis (H. pylori negative)  Continue Carafate 1 g 3 times daily  3.  Irritable bowel syndrome, mixed with diarrhea alternating with constipation  Continue dicyclomine as needed and low FODMAP diet  4.  History of adenomatous colon polyps  Repeat colonoscopy will be due 10/2024  Celso Amy, PA-C  Follow up in 1 year to refill medication.  Otherwise follow-up as needed.

## 2023-07-06 NOTE — Telephone Encounter (Signed)
Patient called and wanted to know if Dr Birdie Sons has faxed over the medical clearance to Dr Wynetta Emery. Dr Lonie Peak office said they have not received clearance from out office. Surgery is on 07/27/2023

## 2023-07-07 ENCOUNTER — Ambulatory Visit: Payer: 59 | Admitting: Physician Assistant

## 2023-07-07 ENCOUNTER — Encounter: Payer: Self-pay | Admitting: Physician Assistant

## 2023-07-07 VITALS — BP 103/69 | HR 91 | Temp 99.4°F | Ht 66.0 in | Wt 188.4 lb

## 2023-07-07 DIAGNOSIS — E119 Type 2 diabetes mellitus without complications: Secondary | ICD-10-CM | POA: Diagnosis not present

## 2023-07-07 DIAGNOSIS — K2950 Unspecified chronic gastritis without bleeding: Secondary | ICD-10-CM

## 2023-07-07 DIAGNOSIS — Z860101 Personal history of adenomatous and serrated colon polyps: Secondary | ICD-10-CM

## 2023-07-07 DIAGNOSIS — K582 Mixed irritable bowel syndrome: Secondary | ICD-10-CM

## 2023-07-07 DIAGNOSIS — K219 Gastro-esophageal reflux disease without esophagitis: Secondary | ICD-10-CM | POA: Diagnosis not present

## 2023-07-07 DIAGNOSIS — E039 Hypothyroidism, unspecified: Secondary | ICD-10-CM | POA: Diagnosis not present

## 2023-07-07 DIAGNOSIS — K2970 Gastritis, unspecified, without bleeding: Secondary | ICD-10-CM

## 2023-07-07 NOTE — Telephone Encounter (Signed)
Pre-op clearance was faxed to Dr. Lonie Peak office  at [phone] on 07/02/23 with fax confirmation.  Have faxed again today and received confirmation.  Called Dr. Lonie Peak office and left a message to be sure that it was received.  Called pt to let her know what I had done.

## 2023-07-08 ENCOUNTER — Telehealth: Payer: 59

## 2023-07-13 ENCOUNTER — Telehealth: Payer: Self-pay | Admitting: Family Medicine

## 2023-07-13 NOTE — Telephone Encounter (Signed)
Given the nature of her surgery she has to be off of all blood thinners for 2 whole days prior to the surgery.  There is no need to bridge with Lovenox given that she has to be off of blood thinners for 2 whole days prior to the surgery.

## 2023-07-13 NOTE — Telephone Encounter (Signed)
Spoke to Valerie Vazquez regarding bridging with the Lovenox but Dr. Birdie Sons recommends the Valerie Vazquez does not need to bridge with the Lovenox due to only needing to be off of her blood thinners for 2 whole days. Valerie Vazquez understands the last day of blood thinners before the surgery is Friday 07/24/23 and No blood thinners on 07/25/23 and 07/26/23.

## 2023-07-13 NOTE — Telephone Encounter (Signed)
Patient would like to know if she can Bridge with Lovenox like she has done in the past?

## 2023-07-13 NOTE — Telephone Encounter (Signed)
Patient called and said surgery is on 07/27/2023. She wants to know if she should do what she usually does before surgery to lower the risk of blood clots.

## 2023-07-17 DIAGNOSIS — M84372S Stress fracture, left ankle, sequela: Secondary | ICD-10-CM | POA: Diagnosis not present

## 2023-07-17 DIAGNOSIS — E119 Type 2 diabetes mellitus without complications: Secondary | ICD-10-CM | POA: Diagnosis not present

## 2023-07-21 DIAGNOSIS — M4316 Spondylolisthesis, lumbar region: Secondary | ICD-10-CM | POA: Diagnosis not present

## 2023-07-21 NOTE — Pre-Procedure Instructions (Signed)
Surgical Instructions   Your procedure is scheduled on July 27, 2023. Report to [facility] Main Entrance "A" at 5:30 A.M., then check in with the Admitting office. Any questions or running late day of surgery: call [phone]  Questions prior to your surgery date: call [phone], Monday-Friday, 8am-4pm. If you experience any cold or flu symptoms such as cough, fever, chills, shortness of breath, etc. between now and your scheduled surgery, please notify us at the above number.     Remember:  Do not eat or drink after midnight the night before your surgery    Take these medicines the morning of surgery with A SIP OF WATER: escitalopram (LEXAPRO)  fluticasone (FLONASE) nasal spray  gabapentin (NEURONTIN)  isosorbide mononitrate (IMDUR)  levothyroxine (SYNTHROID)  pantoprazole (PROTONIX)  sucralfate (CARAFATE)    May take these medicines IF NEEDED: acetaminophen (TYLENOL)  baclofen (LIORESAL)  dicyclomine (BENTYL)  nitroGLYCERIN (NITROSTAT) - please call either above numbers if dose taken prior to surgery ondansetron (ZOFRAN-ODT)    STOP taking your apixaban (ELIQUIS) two days prior to surgery. Your last dose will be November 22nd.   One week prior to surgery, STOP taking any Aspirin (unless otherwise instructed by your surgeon) Aleve, Naproxen, Ibuprofen, Motrin, Advil, Goody's, BC's, all herbal medications, fish oil, and non-prescription vitamins.   WHAT DO I DO ABOUT MY DIABETES MEDICATION?   STOP taking your JARDIANCE three days prior to surgery. Your last dose will be November 21st.      STOP taking your Semaglutide one week prior to surgery. DO NOT take any doses after November 17th.   HOW TO MANAGE YOUR DIABETES BEFORE AND AFTER SURGERY  Why is it important to control my blood sugar before and after surgery? Improving blood sugar levels before and after surgery helps healing and can limit problems. A way of improving blood sugar control is eating a  healthy diet by:  Eating less sugar and carbohydrates  Increasing activity/exercise  Talking with your doctor about reaching your blood sugar goals High blood sugars (greater than 180 mg/dL) can raise your risk of infections and slow your recovery, so you will need to focus on controlling your diabetes during the weeks before surgery. Make sure that the doctor who takes care of your diabetes knows about your planned surgery including the date and location.  How do I manage my blood sugar before surgery? Check your blood sugar at least 4 times a day, starting 2 days before surgery, to make sure that the level is not too high or low.  Check your blood sugar the morning of your surgery when you wake up and every 2 hours until you get to the Short Stay unit.  If your blood sugar is less than 70 mg/dL, you will need to treat for low blood sugar: Do not take insulin. Treat a low blood sugar (less than 70 mg/dL) with  cup of clear juice (cranberry or apple), 4 glucose tablets, OR glucose gel. Recheck blood sugar in 15 minutes after treatment (to make sure it is greater than 70 mg/dL). If your blood sugar is not greater than 70 mg/dL on recheck, call [phone] for further instructions. Report your blood sugar to the short stay nurse when you get to Short Stay.  If you are admitted to the hospital after surgery: Your blood sugar will be checked by the staff and you will probably be given insulin after surgery (instead of oral diabetes medicines) to make sure you have good blood sugar levels. The  goal for blood sugar control after surgery is 80-180 mg/dL.                      Do NOT Smoke (Tobacco/Vaping) for 24 hours prior to your procedure.  If you use a CPAP at night, you may bring your mask/headgear for your overnight stay.   You will be asked to remove any contacts, glasses, piercing's, hearing aid's, dentures/partials prior to surgery. Please bring cases for these items if needed.     Patients discharged the day of surgery will not be allowed to drive home, and someone needs to stay with them for 24 hours.  SURGICAL WAITING ROOM VISITATION Patients may have no more than 2 support people in the waiting area - these visitors may rotate.   Pre-op nurse will coordinate an appropriate time for 1 ADULT support person, who may not rotate, to accompany patient in pre-op.  Children under the age of 36 must have an adult with them who is not the patient and must remain in the main waiting area with an adult.  If the patient needs to stay at the hospital during part of their recovery, the visitor guidelines for inpatient rooms apply.  Please refer to the [facility] website for the visitor guidelines for any additional information.   If you received a COVID test during your pre-op visit  it is requested that you wear a mask when out in public, stay away from anyone that may not be feeling well and notify your surgeon if you develop symptoms. If you have been in contact with anyone that has tested positive in the last 10 days please notify you surgeon.      Pre-operative 5 CHG Bathing Instructions   You can play a key role in reducing the risk of infection after surgery. Your skin needs to be as free of germs as possible. You can reduce the number of germs on your skin by washing with CHG (chlorhexidine gluconate) soap before surgery. CHG is an antiseptic soap that kills germs and continues to kill germs even after washing.   DO NOT use if you have an allergy to chlorhexidine/CHG or antibacterial soaps. If your skin becomes reddened or irritated, stop using the CHG and notify one of our RNs at [phone].   Please shower with the CHG soap starting 4 days before surgery using the following schedule:     Please keep in mind the following:  DO NOT shave, including legs and underarms, starting the day of your first shower.   You may shave your face at any point before/day of  surgery.  Place clean sheets on your bed the day you start using CHG soap. Use a clean washcloth (not used since being washed) for each shower. DO NOT sleep with pets once you start using the CHG.   CHG Shower Instructions:  Wash your face and private area with normal soap. If you choose to wash your hair, wash first with your normal shampoo.  After you use shampoo/soap, rinse your hair and body thoroughly to remove shampoo/soap residue.  Turn the water OFF and apply about 3 tablespoons (45 ml) of CHG soap to a CLEAN washcloth.  Apply CHG soap ONLY FROM YOUR NECK DOWN TO YOUR TOES (washing for 3-5 minutes)  DO NOT use CHG soap on face, private areas, open wounds, or sores.  Pay special attention to the area where your surgery is being performed.  If you are having back surgery,  having someone wash your back for you may be helpful. Wait 2 minutes after CHG soap is applied, then you may rinse off the CHG soap.  Pat dry with a clean towel  Put on clean clothes/pajamas   If you choose to wear lotion, please use ONLY the CHG-compatible lotions on the back of this paper.   Additional instructions for the day of surgery: DO NOT APPLY any lotions, deodorants, cologne, or perfumes.   Do not bring valuables to the hospital. [facility] is not responsible for any belongings/valuables. Do not wear nail polish, gel polish, artificial nails, or any other type of covering on natural nails (fingers and toes) Do not wear jewelry or makeup Put on clean/comfortable clothes.  Please brush your teeth.  Ask your nurse before applying any prescription medications to the skin.     CHG Compatible Lotions   Aveeno Moisturizing lotion  Cetaphil Moisturizing Cream  Cetaphil Moisturizing Lotion  Clairol Herbal Essence Moisturizing Lotion, Dry Skin  Clairol Herbal Essence Moisturizing Lotion, Extra Dry Skin  Clairol Herbal Essence Moisturizing Lotion, Normal Skin  Curel Age Defying Therapeutic Moisturizing  Lotion with Alpha Hydroxy  Curel Extreme Care Body Lotion  Curel Soothing Hands Moisturizing Hand Lotion  Curel Therapeutic Moisturizing Cream, Fragrance-Free  Curel Therapeutic Moisturizing Lotion, Fragrance-Free  Curel Therapeutic Moisturizing Lotion, Original Formula  Eucerin Daily Replenishing Lotion  Eucerin Dry Skin Therapy Plus Alpha Hydroxy Crme  Eucerin Dry Skin Therapy Plus Alpha Hydroxy Lotion  Eucerin Original Crme  Eucerin Original Lotion  Eucerin Plus Crme Eucerin Plus Lotion  Eucerin TriLipid Replenishing Lotion  Keri Anti-Bacterial Hand Lotion  Keri Deep Conditioning Original Lotion Dry Skin Formula Softly Scented  Keri Deep Conditioning Original Lotion, Fragrance Free Sensitive Skin Formula  Keri Lotion Fast Absorbing Fragrance Free Sensitive Skin Formula  Keri Lotion Fast Absorbing Softly Scented Dry Skin Formula  Keri Original Lotion  Keri Skin Renewal Lotion Keri Silky Smooth Lotion  Keri Silky Smooth Sensitive Skin Lotion  Nivea Body Creamy Conditioning Oil  Nivea Body Extra Enriched Lotion  Nivea Body Original Lotion  Nivea Body Sheer Moisturizing Lotion Nivea Crme  Nivea Skin Firming Lotion  NutraDerm 30 Skin Lotion  NutraDerm Skin Lotion  NutraDerm Therapeutic Skin Cream  NutraDerm Therapeutic Skin Lotion  ProShield Protective Hand Cream  Provon moisturizing lotion  Please read over the following fact sheets that you were given.

## 2023-07-22 ENCOUNTER — Other Ambulatory Visit: Payer: Self-pay

## 2023-07-22 ENCOUNTER — Encounter (HOSPITAL_COMMUNITY)
Admission: RE | Admit: 2023-07-22 | Discharge: 2023-07-22 | Disposition: A | Discharge status: Home / Self Care | Payer: 59 | Source: Ambulatory Visit | Attending: Neurosurgery | Admitting: Neurosurgery

## 2023-07-22 ENCOUNTER — Encounter (HOSPITAL_COMMUNITY): Payer: Self-pay

## 2023-07-22 VITALS — BP 121/74 | HR 74 | Temp 98.5°F | Resp 17 | Ht 66.0 in | Wt 192.3 lb

## 2023-07-22 DIAGNOSIS — Z01812 Encounter for preprocedural laboratory examination: Secondary | ICD-10-CM | POA: Diagnosis not present

## 2023-07-22 DIAGNOSIS — I10 Essential (primary) hypertension: Secondary | ICD-10-CM | POA: Insufficient documentation

## 2023-07-22 DIAGNOSIS — Z86718 Personal history of other venous thrombosis and embolism: Secondary | ICD-10-CM | POA: Diagnosis not present

## 2023-07-22 DIAGNOSIS — Z7984 Long term (current) use of oral hypoglycemic drugs: Secondary | ICD-10-CM | POA: Diagnosis not present

## 2023-07-22 DIAGNOSIS — G4733 Obstructive sleep apnea (adult) (pediatric): Secondary | ICD-10-CM | POA: Diagnosis not present

## 2023-07-22 DIAGNOSIS — Z87891 Personal history of nicotine dependence: Secondary | ICD-10-CM | POA: Diagnosis not present

## 2023-07-22 DIAGNOSIS — E039 Hypothyroidism, unspecified: Secondary | ICD-10-CM | POA: Insufficient documentation

## 2023-07-22 DIAGNOSIS — E119 Type 2 diabetes mellitus without complications: Secondary | ICD-10-CM | POA: Insufficient documentation

## 2023-07-22 DIAGNOSIS — Z01818 Encounter for other preprocedural examination: Secondary | ICD-10-CM

## 2023-07-22 DIAGNOSIS — M4316 Spondylolisthesis, lumbar region: Secondary | ICD-10-CM | POA: Diagnosis not present

## 2023-07-22 DIAGNOSIS — Z86711 Personal history of pulmonary embolism: Secondary | ICD-10-CM | POA: Diagnosis not present

## 2023-07-22 HISTORY — DX: Peripheral vascular disease, unspecified: I73.9

## 2023-07-22 HISTORY — DX: Heart failure, unspecified: I50.9

## 2023-07-22 LAB — GLUCOSE, CAPILLARY: Glucose-Capillary: 100 mg/dL — ABNORMAL HIGH (ref 70–99)

## 2023-07-22 LAB — BASIC METABOLIC PANEL
Anion gap: 10 (ref 5–15)
BUN: <5 mg/dL — ABNORMAL LOW (ref 8–23)
CO2: 23 mmol/L (ref 22–32)
Calcium: 8.9 mg/dL (ref 8.9–10.3)
Chloride: 108 mmol/L (ref 98–111)
Creatinine, Ser: 0.81 mg/dL (ref 0.44–1.00)
GFR, Estimated: >60 mL/min (ref >60)
Glucose, Bld: 90 mg/dL (ref 70–99)
Potassium: 3.8 mmol/L (ref 3.5–5.1)
Sodium: 141 mmol/L (ref 135–145)

## 2023-07-22 LAB — CBC
HCT: 35.6 % — ABNORMAL LOW (ref 36.0–46.0)
Hemoglobin: 11.5 g/dL — ABNORMAL LOW (ref 12.0–15.0)
MCH: 31.9 pg (ref 26.0–34.0)
MCHC: 32.3 g/dL (ref 30.0–36.0)
MCV: 98.6 fL (ref 80.0–100.0)
Platelets: 239 10*3/uL (ref 150–400)
RBC: 3.61 MIL/uL — ABNORMAL LOW (ref 3.87–5.11)
RDW: 12.9 % (ref 11.5–15.5)
WBC: 6.6 10*3/uL (ref 4.0–10.5)
nRBC: 0 % (ref 0.0–0.2)

## 2023-07-22 LAB — SURGICAL PCR SCREEN
MRSA, PCR: NEGATIVE
Staphylococcus aureus: NEGATIVE

## 2023-07-22 LAB — TYPE AND SCREEN
ABO/RH(D): B POS
Antibody Screen: NEGATIVE

## 2023-07-22 NOTE — Progress Notes (Signed)
PCP - Ander Purpura, MD Cardiologist - Dr. Debbe Odea - last office visit 06/26/2023  PPM/ICD - Denies Device Orders - n/a Rep Notified - n/a  Chest x-ray - 02/18/2023 EKG - 06/26/2023 Stress Test - 07/20/2020 ECHO - 03/08/2019 Cardiac Cath - 12/09/2014  Sleep Study - Pt +OSA in 2016, but it was mild case. No CPAP required. CPAP - n/a  Pt is DM2. She checks her blood sugar daily. Normal fasting blood sugar is 80-90. CBG at pre-op 100. Last A1c 5.6 on 06/29/23  Last dose of GLP1 agonist-  Last dose of Ozempic 07/18/23 GLP1 instructions: Pt instructed to NOT take any additional doses prior to surgery.  Blood Thinner Instructions: Pt instructed to hold Eliquis for 2 days. Last dose will be November 22nd.  Aspirin Instructions:n/a  NPO after midnight  COVID TEST- n/a   Anesthesia review: Yes. Cardiac and Medical Clearance obtained.   Patient denies shortness of breath, fever, cough and chest pain at PAT appointment. Pt denies any respiratory illness/infection in the last two months.    All instructions explained to the patient, with a verbal understanding of the material. Patient agrees to go over the instructions while at home for a better understanding. Patient also instructed to self quarantine after being tested for COVID-19. The opportunity to ask questions was provided.

## 2023-07-23 NOTE — Progress Notes (Signed)
Anesthesia Chart Review:  Case: [id number] Date/Time: 07/27/23 0715   Procedure: PLIF - L3-L4 - L4-L5 - Posterior Lateral and Interbody fusion (Back)   Anesthesia type: General   Pre-op diagnosis: Spondylolisthesis   [facility] ROOM 20 / [facility] OR   Surgeons: Donalee Citrin, MD       DISCUSSION: Patient is a 67 year old female scheduled for the above procedure.  History includes former smoker (04/12/79), postoperative N/V, HTN, cholesterolemia, DM2, anemia, IBS, GERD, coronary vasospasm (Code STEMI 12/2014 due to EKG changes, but normal coronaries by [facility], suggesting vasospasm), chronic diastolic CHF, recurrent DVT/PE (2002, 2008, Denali IVC filter 03/28/14 at [facility], small bilateral PE 09/19/14), dyspnea, hypothyroidism, osteoarthritis (left THA 04/06/14), spinal surgery.  Reported mild OSA diagnosed in 2016 that did not require CPAP.  She had preoperative cardiology visit on 06/26/23 with Debbe Odea, MD. overall benign event monitor in June 2024.  November 2021 Lexiscan was low risk, no ischemia, EF 55 to 65%.  July 2020 echocardiogram showed normal systolic function, EF 60 to 65%, impaired relaxation, no significant valvular disease.  He wrote, "Preop evaluation, lumbar fusion procedure being planned. Last echo and Lexiscan Myoview showed normal EF, no ischemia. Patient asymptomatic from a cardiac perspective. Okay for procedure. No additional testing required/indicated at this time." Continue Imdur history of coronary vasospasm.  Continue Crestor for HLD. One year follow-up planned.   She is on Eliquis for history of recurrent DVT/PE. PCP Dr. Birdie Sons wrote, "She is medically low risk based on ACS NSQIP surgical risk calculator with the exception of her risk for developing UTI after the surgery.  She does have a strong history of DVT and PE and is at risk of developing 1 of those with coming off of her Eliquis.  In the past we have bridged her with Lovenox though given that she is having a spine  surgery she should be off of her Eliquis for 2 full days prior to her surgical date and thus we will hold off on bridging with Lovenox at this time.  I will let you determine when she can resume her Eliquis after her surgery given the potential risk of bleeding with this surgery.  We will communicate with the patient that she should seek medical attention if she develops chest pain, shortness of breath, or leg swelling with coming off of her Eliquis."  A1c 5.6% on 06/29/23.  She reported last dose of Ozempic 07/18/2023 and instructed to hold until after surgery.  She was instructed to hold Jardiance for 3 days prior to surgery.  Anesthesia team to evaluate on the day of surgery.   VS: BP 121/74   Pulse 74   Temp 36.9 C   Resp 17   Ht 5\' 6"  (1.676 m)   Wt 87.2 kg   SpO2 99%   BMI 31.04 kg/m    PROVIDERS: Glori Luis, MD is PCP  Debbe Odea, MD is cardiologist    LABS: Labs reviewed: Acceptable for surgery. (all labs ordered are listed, but only abnormal results are displayed)  Labs Reviewed  GLUCOSE, CAPILLARY - Abnormal; Notable for the following components:      Result Value   Glucose-Capillary 100 (*)    All other components within normal limits  BASIC METABOLIC PANEL - Abnormal; Notable for the following components:   BUN <5 (*)    All other components within normal limits  CBC - Abnormal; Notable for the following components:   RBC 3.61 (*)    Hemoglobin 11.5 (*)  HCT 35.6 (*)    All other components within normal limits  SURGICAL PCR SCREEN  TYPE AND SCREEN     IMAGES: MRI L-spine 05/21/23: IMPRESSION: 1. Diffuse lumbar spine spondylosis as described above. [See full report] 2. No acute osseous injury of the lumbar spine.  CXR 02/18/23: IMPRESSION: 1. Unchanged chronic small right pleural effusion. 2. Possible minimal chronic left pleural effusion. 3. No acute airspace opacity to indicate pneumonia.   EKG: 06/26/23: Normal sinus  rhythm Possible Left atrial enlargement T wave abnormality, consider lateral ischemia Confirmed by Debbe Odea ([id number]) on 06/26/2023 2:31:15 PM   CV: RLE Venous US 05/30/23: IMPRESSION: No evidence of deep venous thrombosis.    Long term monitor 02/14/23 - 02/27/23:  Patient had a min HR of 55 bpm, max HR of 143 bpm, and avg HR of 75 bpm. Predominant underlying rhythm was Sinus Rhythm. 1 run of Supraventricular Tachycardia occurred lasting 4 beats with a max rate of 143 bpm (avg 141 bpm). Isolated SVEs were rare  (<1.0%), SVE Couplets were rare (<1.0%), and no SVE Triplets were present. Isolated VEs were rare (<1.0%), and no VE Couplets or VE Triplets were present.    Conclusion 1 episode of nonsustained SVT lasting 4 beats. No significant sustained arrhythmias noted. Patient triggered events associated with sinus rhythm. Overall benign cardiac monitor.   Nuclear stress test 07/20/20: The study is normal. This is a low risk study. The left ventricular ejection fraction is normal (55-65%). CT attenuation images showed mild aortic calcifications but no evidence of coronary calcifications.    Echo 03/08/19: IMPRESSIONS   1. The left ventricle has normal systolic function with an ejection  fraction of 60-65%. The cavity size was normal. There is mildly increased  left ventricular wall thickness. Left ventricular diastolic Doppler  parameters are consistent with impaired  relaxation.   2. The right ventricle has normal systolic function. The cavity was  normal. There is no increase in right ventricular wall thickness. Right  ventricular systolic pressure is normal (15-20 mmHg plus central venous  pressure).   3. Right atrial size was mildly dilated.   4. The aortic valve is grossly normal.   5. The aortic root is normal in size and structure.   6. The interatrial septum was not well visualized.    LHC 12/09/14 Left main: No obstructive disease Left Anterior Descending  Artery: No obstructive disease.   Circumflex Artery: Large caliber dominant vessel with large first obtuse marginal branch, moderate caliber left posterolateral branch.   Right Coronary Artery: Small to moderate caliber non-dominant vessel with no obstructive disease.  Left Ventricular Angiogram: LVEF=50%.  Impression: 1. No angiographic evidence of CAD 2. Low normal LV systolic function 3. Chest pain with EKG changes, possibly representing coronary vasospasm   Past Medical History:  Diagnosis Date   Acute right-sided low back pain with right-sided sciatica 10/23/2017   Anemia    Anginal pain (HCC)    Bilateral ovarian cysts    Bilateral renal cysts    Bulimia nervosa 05/07/2021   CAD (coronary artery disease) 09/28/2016   Chest pain 10/14/2015   CHF (congestive heart failure) (HCC)    Diastolic   Chronic back pain    "upper and lower" (09/19/2014)   Chronic shoulder pain (Location of Primary Source of Pain) (Left) 01/29/2016   Colitis 04/08/2016   Colon polyp 01/30/2017   Coronary vasospasm (HCC) 12/09/2014   DDD (degenerative disc disease), cervical    a.) s/p ACDF C5-C7   Depression  DVT (deep venous thrombosis) (HCC)    Esophageal spasm    Essential hypertension 10/28/2013   Gallstones    Gastritis    Gastroenteritis    GERD (gastroesophageal reflux disease)    Heart disease 12/09/2016   High blood pressure    High cholesterol    History of blood transfusion 1985   related to hysterectomy   History of left heart catheterization (LHC) 12/09/2014   a.) LHC 12/09/2014: EF 50%; normal coronaries.   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 Rule out Paget's disease of the bone with blood work.    Hyperlipidemia 10/28/2013   Hypertension    Hypothyroidism    IBS (irritable bowel  syndrome)    Long term current use of anticoagulant    a.) apixaban   Lumbar central spinal stenosis (moderate at L4-5; mild at L2-3 and L3-4) 01/29/2016   Migraine    "a few times/yr" (09/19/2014)   Myocardial infarction (HCC) 10/2010 X 3   "while hospitalized"   Neck pain 01/29/2016   Osteoarthritis    Peripheral vascular disease (HCC)    DVT/PE. On Eliquis   Pleural effusion, bilateral 07/31/2015   Pneumonia 04/2014   PONV (postoperative nausea and vomiting)    PTSD (post-traumatic stress disorder)    Hx. None currently (2024)   Pulmonary embolism (HCC) 04/2001; 09/19/2014   "after gallbladder OR; "   Schizoaffective disorder, depressive type (HCC) 08/28/2016   Sleep apnea    "mild" (09/19/2014)   Snoring    a. sleep study 5/16: No OSA   SOB (shortness of breath) 07/31/2015   Spinal stenosis    Spondylosis    Suicidal ideation 08/27/2016   a.) seen in ED with approx 6 months of reported SI. Reported ingestion of "unknown pills" to end life; last ingestion was 3 days PTA; UDS (+) for TCAs. SI related to depression, friend (loss of friend), and social determinants   Type II diabetes mellitus (HCC)    "dx'd in 2007; lost weight; no RX for ~ 4 yr now" (09/19/2014)    Past Surgical History:  Procedure Laterality Date   ANTERIOR CERVICAL DECOMP/DISCECTOMY FUSION  10/2012   in [place] C5-C7    APPENDECTOMY  1985   BACK SURGERY     BREAST CYST EXCISION Right    BREAST EXCISIONAL BIOPSY Right YRS AGO   NEG   CARDIAC CATHETERIZATION   2000's; 2009   CHOLECYSTECTOMY  2002   COLONOSCOPY WITH PROPOFOL N/A 12/12/2016   Procedure: COLONOSCOPY WITH PROPOFOL;  Surgeon: Wyline Mood, MD;  Location: [facility] ENDOSCOPY;  Service: Endoscopy;  Laterality: N/A;   COLONOSCOPY WITH PROPOFOL N/A 02/17/2017   Procedure: COLONOSCOPY WITH PROPOFOL;  Surgeon: Wyline Mood, MD;  Location: [facility] ENDOSCOPY;  Service: Gastroenterology;  Laterality: N/A;   COLONOSCOPY WITH PROPOFOL N/A 11/04/2021   Procedure:  COLONOSCOPY WITH PROPOFOL;  Surgeon: Wyline Mood, MD;  Location: [facility] ENDOSCOPY;  Service: Gastroenterology;  Laterality: N/A;   CYSTOSCOPY N/A 05/15/2022   Procedure: CYSTOSCOPY;  Surgeon: Conard Novak, MD;  Location: [facility] ORS;  Service: Gynecology;  Laterality: N/A;   ESOPHAGOGASTRODUODENOSCOPY (EGD) WITH PROPOFOL N/A 02/17/2017   Procedure: ESOPHAGOGASTRODUODENOSCOPY (EGD) WITH PROPOFOL;  Surgeon: Wyline Mood, MD;  Location: [facility] ENDOSCOPY;  Service: Gastroenterology;  Laterality: N/A;   ESOPHAGOGASTRODUODENOSCOPY (EGD) WITH PROPOFOL  N/A 03/07/2019   Procedure: ESOPHAGOGASTRODUODENOSCOPY (EGD) WITH PROPOFOL;  Surgeon: Wyline Mood, MD;  Location: [facility] ENDOSCOPY;  Service: Gastroenterology;  Laterality: N/A;   EXCISION/RELEASE BURSA HIP Right 12/1984   HERNIA REPAIR  1985   IVC FILTER REMOVAL     2020s   LEFT HEART CATHETERIZATION WITH CORONARY ANGIOGRAM N/A 12/09/2014   Procedure: LEFT HEART CATHETERIZATION WITH CORONARY ANGIOGRAM;  Surgeon: Kathleene Hazel, MD;  Location: [facility] CATH LAB;  Service: Cardiovascular;  Laterality: N/A;   PARTIAL HYSTERECTOMY  1985   ROBOTIC ASSISTED BILATERAL SALPINGO OOPHERECTOMY Bilateral 05/15/2022   Procedure: XI ROBOTIC ASSISTED BILATERAL OOPHORECTOMY;  Surgeon: Conard Novak, MD;  Location: [facility] ORS;  Service: Gynecology;  Laterality: Bilateral;   TONSILLECTOMY AND ADENOIDECTOMY  ~ 1968   TOTAL HIP ARTHROPLASTY Left 04/06/2014   UMBILICAL HERNIA REPAIR  1985   VENA CAVA FILTER PLACEMENT  03/2014    MEDICATIONS:  acetaminophen (TYLENOL) 500 MG tablet   apixaban (ELIQUIS) 5 MG TABS tablet   baclofen (LIORESAL) 10 MG tablet   blood glucose meter kit and supplies KIT   Blood Glucose Monitoring Suppl (GHT BLOOD GLUCOSE MONITOR) w/Device KIT   cyanocobalamin (VITAMIN B12) 1000 MCG tablet   dicyclomine (BENTYL) 20 MG tablet   escitalopram (LEXAPRO) 20 MG tablet   estradiol (ESTRACE) 0.1 MG/GM vaginal cream   famotidine (PEPCID) 20 MG  tablet   ferrous sulfate 325 (65 FE) MG tablet   fluticasone (FLONASE) 50 MCG/ACT nasal spray   furosemide (LASIX) 20 MG tablet   gabapentin (NEURONTIN) 600 MG tablet   isosorbide mononitrate (IMDUR) 30 MG 24 hr tablet   JARDIANCE 25 MG TABS tablet   levothyroxine (SYNTHROID) 25 MCG tablet   Magnesium 200 MG TABS   Multiple Vitamins-Minerals (MULTIVITAMIN GUMMIES ADULT) CHEW   nitroGLYCERIN (NITROSTAT) 0.4 MG SL tablet   OLANZapine (ZYPREXA) 10 MG tablet   ondansetron (ZOFRAN-ODT) 4 MG disintegrating tablet   OVER THE COUNTER MEDICATION   pantoprazole (PROTONIX) 40 MG tablet   potassium chloride SA (KLOR-CON M) 20 MEQ tablet   Probiotic Product (PROBIOTIC BLEND) CAPS   rosuvastatin (CRESTOR) 40 MG tablet   Semaglutide,0.25 or 0.5MG /DOS, (OZEMPIC, 0.25 OR 0.5 MG/DOSE,) 2 MG/3ML SOPN   sucralfate (CARAFATE) 1 g tablet   triamcinolone cream (KENALOG) 0.5 %   Zinc Citrate (ZINC GUMMY PO)   No current facility-administered medications for this encounter.    Shonna Chock, PA-C Surgical Short Stay/Anesthesiology [facility] Phone [phone] [facility] Phone [phone] 07/23/2023 2:07 PM

## 2023-07-23 NOTE — Anesthesia Preprocedure Evaluation (Addendum)
Anesthesia Evaluation  Patient identified by MRN, date of birth, ID band Patient awake    Reviewed: Allergy & Precautions, H&P , NPO status , Patient's Chart, lab work & pertinent test results  History of Anesthesia Complications Negative for: history of anesthetic complications  Airway Mallampati: II  TM Distance: >3 FB Neck ROM: Full    Dental no notable dental hx. (+) Poor Dentition, Dental Advisory Given,    Pulmonary sleep apnea , former smoker   Pulmonary exam normal breath sounds clear to auscultation       Cardiovascular hypertension, + CAD, + Past MI, + Peripheral Vascular Disease and +CHF  Normal cardiovascular exam Rhythm:Regular Rate:Normal   1. The left ventricle has normal systolic function with an ejection  fraction of 60-65%. The cavity size was normal. There is mildly increased  left ventricular wall thickness. Left ventricular diastolic Doppler  parameters are consistent with impaired  relaxation.   2. The right ventricle has normal systolic function. The cavity was  normal. There is no increase in right ventricular wall thickness. Right  ventricular systolic pressure is normal (15-20 mmHg plus central venous  pressure).   3. Right atrial size was mildly dilated.   4. The aortic valve is grossly normal.   5. The aortic root is normal in size and structure.   6. The interatrial septum was not well visualized.      Neuro/Psych  Headaches PSYCHIATRIC DISORDERS Anxiety Depression  Schizophrenia     GI/Hepatic Neg liver ROS,GERD  ,,  Endo/Other  diabetesHypothyroidism    Renal/GU Renal disease  negative genitourinary   Musculoskeletal  (+) Arthritis ,    Abdominal   Peds negative pediatric ROS (+)  Hematology  (+) Blood dyscrasia, anemia   Anesthesia Other Findings Denies any hx of ponv  Reproductive/Obstetrics negative OB ROS                             Anesthesia  Physical Anesthesia Plan  ASA: 3  Anesthesia Plan: General   Post-op Pain Management:    Induction: Intravenous  PONV Risk Score and Plan: 4 or greater and Ondansetron, Dexamethasone and Treatment may vary due to age or medical condition  Airway Management Planned: Oral ETT  Additional Equipment:   Intra-op Plan:   Post-operative Plan: Extubation in OR  Informed Consent: I have reviewed the patients History and Physical, chart, labs and discussed the procedure including the risks, benefits and alternatives for the proposed anesthesia with the patient or authorized representative who has indicated his/her understanding and acceptance.     Dental advisory given  Plan Discussed with: CRNA  Anesthesia Plan Comments: (PAT note written 07/23/2023 by Shonna Chock, PA-C.  Patient is a 67 year old female scheduled for the above procedure.   History includes former smoker (04/12/79), postoperative N/V, HTN, cholesterolemia, DM2, anemia, IBS, GERD, coronary vasospasm (Code STEMI 12/2014 due to EKG changes, but normal coronaries by [facility], suggesting vasospasm), chronic diastolic CHF, recurrent DVT/PE (2002, 2008, Denali IVC filter 03/28/14 at [facility], small bilateral PE 09/19/14), dyspnea, hypothyroidism, osteoarthritis (left THA 04/06/14), spinal surgery.  Reported mild OSA diagnosed in 2016 that did not require CPAP.   She had preoperative cardiology visit on 06/26/23 with Debbe Odea, MD. overall benign event monitor in June 2024.  November 2021 Lexiscan was low risk, no ischemia, EF 55 to 65%.  July 2020 echocardiogram showed normal systolic function, EF 60 to 65%, impaired relaxation, no significant valvular disease.  He wrote, "Preop evaluation, lumbar fusion procedure being planned. Last echo and Lexiscan Myoview showed normal EF, no ischemia. Patient asymptomatic from a cardiac perspective. Okay for procedure. No additional testing required/indicated at this time." Continue Imdur  history of coronary vasospasm.  Continue Crestor for HLD. One year follow-up planned.    She is on Eliquis for history of recurrent DVT/PE. PCP Dr. Birdie Sons wrote, "She is medically low risk based on ACS NSQIP surgical risk calculator with the exception of her risk for developing UTI after the surgery.  She does have a strong history of DVT and PE and is at risk of developing 1 of those with coming off of her Eliquis.  In the past we have bridged her with Lovenox though given that she is having a spine surgery she should be off of her Eliquis for 2 full days prior to her surgical date and thus we will hold off on bridging with Lovenox at this time.  I will let you determine when she can resume her Eliquis after her surgery given the potential risk of bleeding with this surgery.  We will communicate with the patient that she should seek medical attention if she develops chest pain, shortness of breath, or leg swelling with coming off of her Eliquis."   A1c 5.6% on 06/29/23.  She reported last dose of Ozempic 07/18/2023 and instructed to hold until after surgery.  She was instructed to hold Jardiance for 3 days prior to surgery. )       Anesthesia Quick Evaluation

## 2023-07-24 ENCOUNTER — Other Ambulatory Visit: Payer: Self-pay | Admitting: Physician Assistant

## 2023-07-24 ENCOUNTER — Other Ambulatory Visit: Payer: Self-pay | Admitting: Family Medicine

## 2023-07-24 DIAGNOSIS — E119 Type 2 diabetes mellitus without complications: Secondary | ICD-10-CM

## 2023-07-24 DIAGNOSIS — K2990 Gastroduodenitis, unspecified, without bleeding: Secondary | ICD-10-CM

## 2023-07-24 DIAGNOSIS — J341 Cyst and mucocele of nose and nasal sinus: Secondary | ICD-10-CM

## 2023-07-26 ENCOUNTER — Other Ambulatory Visit: Payer: Self-pay | Admitting: Gastroenterology

## 2023-07-27 ENCOUNTER — Inpatient Hospital Stay (HOSPITAL_COMMUNITY)
Admission: RE | Disposition: A | Discharge status: Skilled Nursing Facility | Payer: Self-pay | Source: Home / Self Care | Attending: Neurosurgery

## 2023-07-27 ENCOUNTER — Other Ambulatory Visit: Payer: Self-pay

## 2023-07-27 ENCOUNTER — Encounter (HOSPITAL_COMMUNITY): Payer: Self-pay | Admitting: Neurosurgery

## 2023-07-27 ENCOUNTER — Inpatient Hospital Stay (HOSPITAL_COMMUNITY)
Admission: RE | Admit: 2023-07-27 | Discharge: 2023-08-03 | DRG: 427 | Disposition: A | Discharge status: Skilled Nursing Facility | Payer: 59 | Attending: Neurosurgery | Admitting: Neurosurgery

## 2023-07-27 ENCOUNTER — Inpatient Hospital Stay (HOSPITAL_COMMUNITY): Payer: 59 | Admitting: Vascular Surgery

## 2023-07-27 ENCOUNTER — Inpatient Hospital Stay (HOSPITAL_COMMUNITY): Payer: 59

## 2023-07-27 DIAGNOSIS — Z981 Arthrodesis status: Secondary | ICD-10-CM

## 2023-07-27 DIAGNOSIS — I10 Essential (primary) hypertension: Secondary | ICD-10-CM | POA: Diagnosis not present

## 2023-07-27 DIAGNOSIS — Z6831 Body mass index (BMI) 31.0-31.9, adult: Secondary | ICD-10-CM

## 2023-07-27 DIAGNOSIS — Z86718 Personal history of other venous thrombosis and embolism: Secondary | ICD-10-CM

## 2023-07-27 DIAGNOSIS — Z880 Allergy status to penicillin: Secondary | ICD-10-CM | POA: Diagnosis not present

## 2023-07-27 DIAGNOSIS — M48062 Spinal stenosis, lumbar region with neurogenic claudication: Principal | ICD-10-CM | POA: Diagnosis present

## 2023-07-27 DIAGNOSIS — Z96642 Presence of left artificial hip joint: Secondary | ICD-10-CM | POA: Diagnosis present

## 2023-07-27 DIAGNOSIS — Z881 Allergy status to other antibiotic agents status: Secondary | ICD-10-CM

## 2023-07-27 DIAGNOSIS — E669 Obesity, unspecified: Secondary | ICD-10-CM | POA: Diagnosis present

## 2023-07-27 DIAGNOSIS — M5116 Intervertebral disc disorders with radiculopathy, lumbar region: Secondary | ICD-10-CM | POA: Diagnosis not present

## 2023-07-27 DIAGNOSIS — Z87891 Personal history of nicotine dependence: Secondary | ICD-10-CM | POA: Diagnosis not present

## 2023-07-27 DIAGNOSIS — E039 Hypothyroidism, unspecified: Secondary | ICD-10-CM | POA: Diagnosis present

## 2023-07-27 DIAGNOSIS — N3000 Acute cystitis without hematuria: Secondary | ICD-10-CM | POA: Diagnosis not present

## 2023-07-27 DIAGNOSIS — E1151 Type 2 diabetes mellitus with diabetic peripheral angiopathy without gangrene: Secondary | ICD-10-CM | POA: Diagnosis present

## 2023-07-27 DIAGNOSIS — Z7985 Long-term (current) use of injectable non-insulin antidiabetic drugs: Secondary | ICD-10-CM

## 2023-07-27 DIAGNOSIS — D649 Anemia, unspecified: Secondary | ICD-10-CM | POA: Diagnosis not present

## 2023-07-27 DIAGNOSIS — Z885 Allergy status to narcotic agent status: Secondary | ICD-10-CM

## 2023-07-27 DIAGNOSIS — E7800 Pure hypercholesterolemia, unspecified: Secondary | ICD-10-CM | POA: Diagnosis not present

## 2023-07-27 DIAGNOSIS — I509 Heart failure, unspecified: Secondary | ICD-10-CM | POA: Diagnosis present

## 2023-07-27 DIAGNOSIS — I2510 Atherosclerotic heart disease of native coronary artery without angina pectoris: Secondary | ICD-10-CM | POA: Diagnosis present

## 2023-07-27 DIAGNOSIS — R509 Fever, unspecified: Secondary | ICD-10-CM | POA: Diagnosis not present

## 2023-07-27 DIAGNOSIS — Z801 Family history of malignant neoplasm of trachea, bronchus and lung: Secondary | ICD-10-CM

## 2023-07-27 DIAGNOSIS — K219 Gastro-esophageal reflux disease without esophagitis: Secondary | ICD-10-CM | POA: Diagnosis not present

## 2023-07-27 DIAGNOSIS — Z79890 Hormone replacement therapy (postmenopausal): Secondary | ICD-10-CM

## 2023-07-27 DIAGNOSIS — Z823 Family history of stroke: Secondary | ICD-10-CM

## 2023-07-27 DIAGNOSIS — E119 Type 2 diabetes mellitus without complications: Secondary | ICD-10-CM | POA: Diagnosis not present

## 2023-07-27 DIAGNOSIS — Z86711 Personal history of pulmonary embolism: Secondary | ICD-10-CM | POA: Diagnosis not present

## 2023-07-27 DIAGNOSIS — Z818 Family history of other mental and behavioral disorders: Secondary | ICD-10-CM

## 2023-07-27 DIAGNOSIS — Z91018 Allergy to other foods: Secondary | ICD-10-CM | POA: Diagnosis not present

## 2023-07-27 DIAGNOSIS — I110 Hypertensive heart disease with heart failure: Secondary | ICD-10-CM | POA: Diagnosis not present

## 2023-07-27 DIAGNOSIS — M4807 Spinal stenosis, lumbosacral region: Secondary | ICD-10-CM | POA: Diagnosis not present

## 2023-07-27 DIAGNOSIS — M4316 Spondylolisthesis, lumbar region: Secondary | ICD-10-CM | POA: Diagnosis not present

## 2023-07-27 DIAGNOSIS — R918 Other nonspecific abnormal finding of lung field: Secondary | ICD-10-CM | POA: Diagnosis not present

## 2023-07-27 DIAGNOSIS — G4730 Sleep apnea, unspecified: Secondary | ICD-10-CM | POA: Diagnosis present

## 2023-07-27 DIAGNOSIS — M549 Dorsalgia, unspecified: Secondary | ICD-10-CM | POA: Diagnosis not present

## 2023-07-27 DIAGNOSIS — R400 Somnolence: Secondary | ICD-10-CM | POA: Diagnosis not present

## 2023-07-27 DIAGNOSIS — Z7401 Bed confinement status: Secondary | ICD-10-CM | POA: Diagnosis not present

## 2023-07-27 DIAGNOSIS — M5416 Radiculopathy, lumbar region: Secondary | ICD-10-CM | POA: Diagnosis not present

## 2023-07-27 DIAGNOSIS — M48061 Spinal stenosis, lumbar region without neurogenic claudication: Secondary | ICD-10-CM | POA: Diagnosis not present

## 2023-07-27 DIAGNOSIS — Z7984 Long term (current) use of oral hypoglycemic drugs: Secondary | ICD-10-CM

## 2023-07-27 DIAGNOSIS — Z882 Allergy status to sulfonamides status: Secondary | ICD-10-CM

## 2023-07-27 DIAGNOSIS — Z91048 Other nonmedicinal substance allergy status: Secondary | ICD-10-CM | POA: Diagnosis not present

## 2023-07-27 DIAGNOSIS — R2681 Unsteadiness on feet: Secondary | ICD-10-CM | POA: Diagnosis not present

## 2023-07-27 DIAGNOSIS — D638 Anemia in other chronic diseases classified elsewhere: Secondary | ICD-10-CM | POA: Diagnosis present

## 2023-07-27 DIAGNOSIS — M6281 Muscle weakness (generalized): Secondary | ICD-10-CM | POA: Diagnosis not present

## 2023-07-27 DIAGNOSIS — Z79899 Other long term (current) drug therapy: Secondary | ICD-10-CM

## 2023-07-27 DIAGNOSIS — Z7901 Long term (current) use of anticoagulants: Secondary | ICD-10-CM

## 2023-07-27 DIAGNOSIS — I252 Old myocardial infarction: Secondary | ICD-10-CM | POA: Diagnosis not present

## 2023-07-27 DIAGNOSIS — Z9889 Other specified postprocedural states: Secondary | ICD-10-CM | POA: Diagnosis not present

## 2023-07-27 DIAGNOSIS — I5032 Chronic diastolic (congestive) heart failure: Secondary | ICD-10-CM | POA: Diagnosis not present

## 2023-07-27 DIAGNOSIS — R278 Other lack of coordination: Secondary | ICD-10-CM | POA: Diagnosis not present

## 2023-07-27 DIAGNOSIS — Z79818 Long term (current) use of other agents affecting estrogen receptors and estrogen levels: Secondary | ICD-10-CM

## 2023-07-27 DIAGNOSIS — E785 Hyperlipidemia, unspecified: Secondary | ICD-10-CM | POA: Diagnosis not present

## 2023-07-27 DIAGNOSIS — Z743 Need for continuous supervision: Secondary | ICD-10-CM | POA: Diagnosis not present

## 2023-07-27 DIAGNOSIS — M532X6 Spinal instabilities, lumbar region: Secondary | ICD-10-CM | POA: Diagnosis not present

## 2023-07-27 LAB — GLUCOSE, CAPILLARY
Glucose-Capillary: 104 mg/dL — ABNORMAL HIGH (ref 70–99)
Glucose-Capillary: 109 mg/dL — ABNORMAL HIGH (ref 70–99)
Glucose-Capillary: 156 mg/dL — ABNORMAL HIGH (ref 70–99)
Glucose-Capillary: 172 mg/dL — ABNORMAL HIGH (ref 70–99)
Glucose-Capillary: 140 mg/dL — ABNORMAL HIGH (ref 70–99)

## 2023-07-27 SURGERY — POSTERIOR LUMBAR FUSION 2 LEVEL
Anesthesia: General | Site: Back

## 2023-07-27 MED ORDER — LIDOCAINE 2% (20 MG/ML) 5 ML SYRINGE
INTRAMUSCULAR | Status: AC
  Filled 2023-07-27: qty 5

## 2023-07-27 MED ORDER — EPHEDRINE SULFATE (PRESSORS) 50 MG/ML IJ SOLN
INTRAMUSCULAR | Status: DC | PRN
  Administered 2023-07-27: 10 mg via INTRAVENOUS

## 2023-07-27 MED ORDER — ONDANSETRON 4 MG PO TBDP
4.0000 mg | ORAL_TABLET | Freq: Four times a day (QID) | ORAL | Status: DC | PRN
  Administered 2023-07-28: 4 mg via ORAL
  Filled 2023-07-27: qty 1

## 2023-07-27 MED ORDER — LACTATED RINGERS IV SOLN: INTRAVENOUS | Status: DC

## 2023-07-27 MED ORDER — LIDOCAINE-EPINEPHRINE 1 %-1:100000 IJ SOLN
INTRAMUSCULAR | Status: AC
Start: 2023-07-27 — End: ?
  Filled 2023-07-27: qty 1

## 2023-07-27 MED ORDER — ONDANSETRON HCL 4 MG/2ML IJ SOLN
INTRAMUSCULAR | Status: DC | PRN
  Administered 2023-07-27: 4 mg via INTRAVENOUS

## 2023-07-27 MED ORDER — EMPAGLIFLOZIN 25 MG PO TABS
25.0000 mg | ORAL_TABLET | Freq: Every day | ORAL | Status: DC
  Administered 2023-07-28 – 2023-08-03 (×7): 25 mg via ORAL
  Filled 2023-07-27 (×7): qty 1

## 2023-07-27 MED ORDER — FENTANYL CITRATE (PF) 100 MCG/2ML IJ SOLN
25.0000 ug | INTRAMUSCULAR | Status: DC | PRN
  Administered 2023-07-27: 25 ug via INTRAVENOUS

## 2023-07-27 MED ORDER — INSULIN ASPART 100 UNIT/ML IJ SOLN: 0.0000 [IU] | INTRAMUSCULAR | Status: DC | PRN

## 2023-07-27 MED ORDER — PROBIOTIC BLEND PO CAPS: 1.0000 | ORAL_CAPSULE | Freq: Every day | ORAL | Status: DC

## 2023-07-27 MED ORDER — ISOSORBIDE MONONITRATE ER 30 MG PO TB24
15.0000 mg | ORAL_TABLET | Freq: Every day | ORAL | Status: DC
  Administered 2023-07-28 – 2023-08-03 (×6): 15 mg via ORAL
  Filled 2023-07-27 (×7): qty 1

## 2023-07-27 MED ORDER — MENTHOL 3 MG MT LOZG: 1.0000 | LOZENGE | OROMUCOSAL | Status: DC | PRN

## 2023-07-27 MED ORDER — THROMBIN 5000 UNITS EX SOLR
CUTANEOUS | Status: AC
  Filled 2023-07-27: qty 5000

## 2023-07-27 MED ORDER — PHENOL 1.4 % MT LIQD: 1.0000 | OROMUCOSAL | Status: DC | PRN

## 2023-07-27 MED ORDER — FAMOTIDINE 20 MG PO TABS
20.0000 mg | ORAL_TABLET | Freq: Every day | ORAL | Status: DC
  Administered 2023-07-27 – 2023-08-02 (×7): 20 mg via ORAL
  Filled 2023-07-27 (×7): qty 1

## 2023-07-27 MED ORDER — HYDROCODONE-ACETAMINOPHEN 5-325 MG PO TABS
1.0000 | ORAL_TABLET | ORAL | Status: DC | PRN
  Administered 2023-07-27 – 2023-07-28 (×4): 1 via ORAL
  Filled 2023-07-27 (×5): qty 1

## 2023-07-27 MED ORDER — CEFAZOLIN SODIUM-DEXTROSE 2-4 GM/100ML-% IV SOLN
2.0000 g | INTRAVENOUS | Status: AC
  Administered 2023-07-27: 2 mg via INTRAVENOUS
  Filled 2023-07-27: qty 100

## 2023-07-27 MED ORDER — FENTANYL CITRATE (PF) 250 MCG/5ML IJ SOLN
INTRAMUSCULAR | Status: DC | PRN
  Administered 2023-07-27 (×4): 50 ug via INTRAVENOUS

## 2023-07-27 MED ORDER — THROMBIN 20000 UNITS EX SOLR: CUTANEOUS | Status: DC | PRN

## 2023-07-27 MED ORDER — LIDOCAINE-EPINEPHRINE 1 %-1:100000 IJ SOLN
INTRAMUSCULAR | Status: DC | PRN
  Administered 2023-07-27: 10 mL

## 2023-07-27 MED ORDER — CEFAZOLIN SODIUM-DEXTROSE 2-4 GM/100ML-% IV SOLN
2.0000 g | Freq: Three times a day (TID) | INTRAVENOUS | Status: AC
  Administered 2023-07-27 (×2): 2 g via INTRAVENOUS
  Filled 2023-07-27 (×2): qty 100

## 2023-07-27 MED ORDER — CYCLOBENZAPRINE HCL 10 MG PO TABS
10.0000 mg | ORAL_TABLET | Freq: Three times a day (TID) | ORAL | Status: DC | PRN
  Administered 2023-07-29: 10 mg via ORAL
  Filled 2023-07-27 (×2): qty 1

## 2023-07-27 MED ORDER — PHENYLEPHRINE HCL-NACL 20-0.9 MG/250ML-% IV SOLN
INTRAVENOUS | Status: DC | PRN
  Administered 2023-07-27: 25 ug/min via INTRAVENOUS

## 2023-07-27 MED ORDER — ROCURONIUM BROMIDE 10 MG/ML (PF) SYRINGE
PREFILLED_SYRINGE | INTRAVENOUS | Status: DC | PRN
  Administered 2023-07-27: 10 mg via INTRAVENOUS
  Administered 2023-07-27: 80 mg via INTRAVENOUS
  Administered 2023-07-27: 10 mg via INTRAVENOUS

## 2023-07-27 MED ORDER — THROMBIN (RECOMBINANT) 5000 UNITS EX SOLR: CUTANEOUS | Status: DC | PRN

## 2023-07-27 MED ORDER — SODIUM CHLORIDE 0.9 % IV SOLN: 250.0000 mL | INTRAVENOUS | Status: DC

## 2023-07-27 MED ORDER — INSULIN ASPART 100 UNIT/ML IJ SOLN
0.0000 [IU] | Freq: Three times a day (TID) | INTRAMUSCULAR | Status: DC
  Administered 2023-07-27: 3 [IU] via SUBCUTANEOUS
  Administered 2023-07-28 – 2023-07-30 (×3): 2 [IU] via SUBCUTANEOUS
  Administered 2023-07-31 – 2023-08-01 (×2): 3 [IU] via SUBCUTANEOUS
  Administered 2023-08-01 – 2023-08-03 (×3): 2 [IU] via SUBCUTANEOUS

## 2023-07-27 MED ORDER — DROPERIDOL 2.5 MG/ML IJ SOLN: 0.6250 mg | Freq: Once | INTRAMUSCULAR | Status: DC | PRN

## 2023-07-27 MED ORDER — THROMBIN 20000 UNITS EX SOLR
CUTANEOUS | Status: AC
  Filled 2023-07-27: qty 20000

## 2023-07-27 MED ORDER — LEVOTHYROXINE SODIUM 25 MCG PO TABS
25.0000 ug | ORAL_TABLET | Freq: Every day | ORAL | Status: DC
  Administered 2023-07-28 – 2023-08-03 (×7): 25 ug via ORAL
  Filled 2023-07-27 (×7): qty 1

## 2023-07-27 MED ORDER — SODIUM CHLORIDE 0.9% FLUSH: 3.0000 mL | INTRAVENOUS | Status: DC | PRN

## 2023-07-27 MED ORDER — ESCITALOPRAM OXALATE 10 MG PO TABS
20.0000 mg | ORAL_TABLET | ORAL | Status: DC
  Administered 2023-07-28 – 2023-08-03 (×7): 20 mg via ORAL
  Filled 2023-07-27: qty 1
  Filled 2023-07-27 (×4): qty 2
  Filled 2023-07-27 (×4): qty 1
  Filled 2023-07-27 (×2): qty 2
  Filled 2023-07-27: qty 1
  Filled 2023-07-27: qty 2
  Filled 2023-07-27: qty 1

## 2023-07-27 MED ORDER — CHLORHEXIDINE GLUCONATE 0.12 % MT SOLN
15.0000 mL | Freq: Once | OROMUCOSAL | Status: AC
  Administered 2023-07-27: 15 mL via OROMUCOSAL
  Filled 2023-07-27: qty 15

## 2023-07-27 MED ORDER — PROPOFOL 500 MG/50ML IV EMUL
INTRAVENOUS | Status: DC | PRN
  Administered 2023-07-27: 30 ug/kg/min via INTRAVENOUS

## 2023-07-27 MED ORDER — ORAL CARE MOUTH RINSE: 15.0000 mL | Freq: Once | OROMUCOSAL | Status: AC

## 2023-07-27 MED ORDER — BUPIVACAINE LIPOSOME 1.3 % IJ SUSP
INTRAMUSCULAR | Status: AC
Start: 2023-07-27 — End: ?
  Filled 2023-07-27: qty 20

## 2023-07-27 MED ORDER — MIDAZOLAM HCL 2 MG/2ML IJ SOLN
INTRAMUSCULAR | Status: AC
Start: 2023-07-27 — End: ?
  Filled 2023-07-27: qty 2

## 2023-07-27 MED ORDER — BUPIVACAINE LIPOSOME 1.3 % IJ SUSP
INTRAMUSCULAR | Status: AC
  Filled 2023-07-27: qty 20

## 2023-07-27 MED ORDER — DICYCLOMINE HCL 20 MG PO TABS
20.0000 mg | ORAL_TABLET | Freq: Three times a day (TID) | ORAL | Status: DC
  Administered 2023-07-27 – 2023-08-03 (×27): 20 mg via ORAL
  Filled 2023-07-27 (×30): qty 1

## 2023-07-27 MED ORDER — SUCRALFATE 1 G PO TABS
1.0000 g | ORAL_TABLET | Freq: Three times a day (TID) | ORAL | Status: DC
  Administered 2023-07-27 – 2023-08-03 (×20): 1 g via ORAL
  Filled 2023-07-27 (×22): qty 1

## 2023-07-27 MED ORDER — HYDROMORPHONE HCL 1 MG/ML IJ SOLN
0.5000 mg | INTRAMUSCULAR | Status: DC | PRN
  Administered 2023-07-27 – 2023-07-29 (×3): 0.5 mg via INTRAVENOUS
  Filled 2023-07-27 (×3): qty 0.5

## 2023-07-27 MED ORDER — BUPIVACAINE LIPOSOME 1.3 % IJ SUSP
INTRAMUSCULAR | Status: DC | PRN
  Administered 2023-07-27: 20 mL

## 2023-07-27 MED ORDER — GABAPENTIN 300 MG PO CAPS
900.0000 mg | ORAL_CAPSULE | ORAL | Status: DC
  Administered 2023-07-27 – 2023-08-02 (×7): 900 mg via ORAL
  Filled 2023-07-27 (×7): qty 3

## 2023-07-27 MED ORDER — ACETAMINOPHEN 325 MG PO TABS
650.0000 mg | ORAL_TABLET | ORAL | Status: DC | PRN
  Administered 2023-07-31 – 2023-08-01 (×3): 650 mg via ORAL
  Filled 2023-07-27 (×3): qty 2

## 2023-07-27 MED ORDER — POTASSIUM CHLORIDE CRYS ER 20 MEQ PO TBCR: 20.0000 meq | EXTENDED_RELEASE_TABLET | Freq: Every day | ORAL | Status: DC | PRN

## 2023-07-27 MED ORDER — OLANZAPINE 10 MG PO TABS
10.0000 mg | ORAL_TABLET | Freq: Every day | ORAL | Status: DC
  Administered 2023-07-27 – 2023-08-02 (×7): 10 mg via ORAL
  Filled 2023-07-27 (×8): qty 1

## 2023-07-27 MED ORDER — THROMBIN 5000 UNITS EX SOLR
CUTANEOUS | Status: AC
Start: 2023-07-27 — End: ?
  Filled 2023-07-27: qty 10000

## 2023-07-27 MED ORDER — ONDANSETRON HCL 4 MG/2ML IJ SOLN
INTRAMUSCULAR | Status: AC
Start: 2023-07-27 — End: ?
  Filled 2023-07-27: qty 2

## 2023-07-27 MED ORDER — DEXAMETHASONE SODIUM PHOSPHATE 10 MG/ML IJ SOLN
INTRAMUSCULAR | Status: DC | PRN
  Administered 2023-07-27: 10 mg via INTRAVENOUS

## 2023-07-27 MED ORDER — PANTOPRAZOLE SODIUM 40 MG PO TBEC
40.0000 mg | DELAYED_RELEASE_TABLET | Freq: Every day | ORAL | Status: DC
  Administered 2023-07-28 – 2023-08-03 (×7): 40 mg via ORAL
  Filled 2023-07-27 (×7): qty 1

## 2023-07-27 MED ORDER — ROCURONIUM BROMIDE 10 MG/ML (PF) SYRINGE
PREFILLED_SYRINGE | INTRAVENOUS | Status: AC
  Filled 2023-07-27: qty 10

## 2023-07-27 MED ORDER — NITROGLYCERIN 0.4 MG SL SUBL: 0.4000 mg | SUBLINGUAL_TABLET | SUBLINGUAL | Status: DC | PRN

## 2023-07-27 MED ORDER — PROPOFOL 10 MG/ML IV BOLUS
INTRAVENOUS | Status: DC | PRN
  Administered 2023-07-27: 150 mg via INTRAVENOUS

## 2023-07-27 MED ORDER — MAGNESIUM OXIDE -MG SUPPLEMENT 400 (240 MG) MG PO TABS
200.0000 mg | ORAL_TABLET | Freq: Every day | ORAL | Status: DC
  Administered 2023-07-28 – 2023-08-03 (×7): 200 mg via ORAL
  Filled 2023-07-27 (×2): qty 0.5
  Filled 2023-07-27: qty 1
  Filled 2023-07-27 (×5): qty 0.5

## 2023-07-27 MED ORDER — PROPOFOL 10 MG/ML IV BOLUS
INTRAVENOUS | Status: AC
  Filled 2023-07-27: qty 20

## 2023-07-27 MED ORDER — FENTANYL CITRATE (PF) 100 MCG/2ML IJ SOLN
INTRAMUSCULAR | Status: AC
  Administered 2023-07-27: 25 ug via INTRAVENOUS
  Filled 2023-07-27: qty 2

## 2023-07-27 MED ORDER — ACETAMINOPHEN 10 MG/ML IV SOLN
INTRAVENOUS | Status: DC | PRN
  Administered 2023-07-27: 1000 mg via INTRAVENOUS

## 2023-07-27 MED ORDER — FERROUS SULFATE 325 (65 FE) MG PO TABS
325.0000 mg | ORAL_TABLET | Freq: Every day | ORAL | Status: DC
  Administered 2023-07-28 – 2023-08-03 (×7): 325 mg via ORAL
  Filled 2023-07-27 (×7): qty 1

## 2023-07-27 MED ORDER — LIDOCAINE 2% (20 MG/ML) 5 ML SYRINGE
INTRAMUSCULAR | Status: DC | PRN
  Administered 2023-07-27: 100 mg via INTRAVENOUS

## 2023-07-27 MED ORDER — ADULT MULTIVITAMIN W/MINERALS CH
1.0000 | ORAL_TABLET | Freq: Every day | ORAL | Status: DC
  Administered 2023-07-28 – 2023-08-03 (×7): 1 via ORAL
  Filled 2023-07-27 (×7): qty 1

## 2023-07-27 MED ORDER — THROMBIN 5000 UNITS EX SOLR: OROMUCOSAL | Status: DC | PRN

## 2023-07-27 MED ORDER — ACETAMINOPHEN 10 MG/ML IV SOLN: 1000.0000 mg | Freq: Once | INTRAVENOUS | Status: DC | PRN

## 2023-07-27 MED ORDER — PANTOPRAZOLE SODIUM 40 MG IV SOLR: 40.0000 mg | Freq: Every day | INTRAVENOUS | Status: DC

## 2023-07-27 MED ORDER — FLUTICASONE PROPIONATE 50 MCG/ACT NA SUSP
2.0000 | Freq: Every day | NASAL | Status: DC
  Administered 2023-07-28 – 2023-08-03 (×7): 2 via NASAL
  Filled 2023-07-27: qty 16

## 2023-07-27 MED ORDER — ZINC CITRATE 11 MG PO CHEW: CHEWABLE_TABLET | Freq: Every day | ORAL | Status: DC

## 2023-07-27 MED ORDER — ACETAMINOPHEN 10 MG/ML IV SOLN
INTRAVENOUS | Status: AC
Start: 2023-07-27 — End: ?
  Filled 2023-07-27: qty 100

## 2023-07-27 MED ORDER — VITAMIN B-12 1000 MCG PO TABS
1000.0000 ug | ORAL_TABLET | Freq: Every day | ORAL | Status: DC
  Administered 2023-07-28 – 2023-08-03 (×7): 1000 ug via ORAL
  Filled 2023-07-27 (×7): qty 1

## 2023-07-27 MED ORDER — 0.9 % SODIUM CHLORIDE (POUR BTL) OPTIME
TOPICAL | Status: DC | PRN
  Administered 2023-07-27: 2000 mL

## 2023-07-27 MED ORDER — PHENYLEPHRINE 80 MCG/ML (10ML) SYRINGE FOR IV PUSH (FOR BLOOD PRESSURE SUPPORT)
PREFILLED_SYRINGE | INTRAVENOUS | Status: DC | PRN
  Administered 2023-07-27 (×2): 160 ug via INTRAVENOUS
  Administered 2023-07-27: 240 ug via INTRAVENOUS

## 2023-07-27 MED ORDER — BUPIVACAINE HCL (PF) 0.25 % IJ SOLN
INTRAMUSCULAR | Status: AC
  Filled 2023-07-27: qty 30

## 2023-07-27 MED ORDER — SUGAMMADEX SODIUM 200 MG/2ML IV SOLN
INTRAVENOUS | Status: DC | PRN
  Administered 2023-07-27: 300 mg via INTRAVENOUS

## 2023-07-27 MED ORDER — OXYCODONE HCL 5 MG PO TABS: 5.0000 mg | ORAL_TABLET | Freq: Once | ORAL | Status: DC | PRN

## 2023-07-27 MED ORDER — GABAPENTIN 300 MG PO CAPS
600.0000 mg | ORAL_CAPSULE | ORAL | Status: DC
  Administered 2023-07-28 – 2023-08-03 (×7): 600 mg via ORAL
  Filled 2023-07-27 (×7): qty 2

## 2023-07-27 MED ORDER — PHENYLEPHRINE 80 MCG/ML (10ML) SYRINGE FOR IV PUSH (FOR BLOOD PRESSURE SUPPORT)
PREFILLED_SYRINGE | INTRAVENOUS | Status: AC
  Filled 2023-07-27: qty 10

## 2023-07-27 MED ORDER — MIDAZOLAM HCL 2 MG/2ML IJ SOLN
INTRAMUSCULAR | Status: DC | PRN
  Administered 2023-07-27: 1 mg via INTRAVENOUS

## 2023-07-27 MED ORDER — ROSUVASTATIN CALCIUM 20 MG PO TABS
40.0000 mg | ORAL_TABLET | Freq: Every day | ORAL | Status: DC
  Administered 2023-07-27 – 2023-08-02 (×7): 40 mg via ORAL
  Filled 2023-07-27 (×7): qty 2

## 2023-07-27 MED ORDER — ACETAMINOPHEN 650 MG RE SUPP: 650.0000 mg | RECTAL | Status: DC | PRN

## 2023-07-27 MED ORDER — FENTANYL CITRATE (PF) 250 MCG/5ML IJ SOLN
INTRAMUSCULAR | Status: AC
  Filled 2023-07-27: qty 5

## 2023-07-27 MED ORDER — FUROSEMIDE 20 MG PO TABS
20.0000 mg | ORAL_TABLET | Freq: Every day | ORAL | Status: DC
  Administered 2023-07-28 – 2023-08-03 (×7): 20 mg via ORAL
  Filled 2023-07-27 (×7): qty 1

## 2023-07-27 MED ORDER — ALUM & MAG HYDROXIDE-SIMETH 200-200-20 MG/5ML PO SUSP: 30.0000 mL | Freq: Four times a day (QID) | ORAL | Status: DC | PRN

## 2023-07-27 MED ORDER — EPHEDRINE 5 MG/ML INJ
INTRAVENOUS | Status: AC
Start: 2023-07-27 — End: ?
  Filled 2023-07-27: qty 5

## 2023-07-27 MED ORDER — CHLORHEXIDINE GLUCONATE CLOTH 2 % EX PADS: 6.0000 | MEDICATED_PAD | Freq: Once | CUTANEOUS | Status: DC

## 2023-07-27 MED ORDER — ACETAMINOPHEN 500 MG PO TABS: 500.0000 mg | ORAL_TABLET | Freq: Four times a day (QID) | ORAL | Status: DC | PRN

## 2023-07-27 MED ORDER — SODIUM CHLORIDE 0.9% FLUSH
3.0000 mL | Freq: Two times a day (BID) | INTRAVENOUS | Status: DC
  Administered 2023-07-27 – 2023-08-03 (×14): 3 mL via INTRAVENOUS

## 2023-07-27 MED ORDER — BACLOFEN 10 MG PO TABS
10.0000 mg | ORAL_TABLET | Freq: Three times a day (TID) | ORAL | Status: DC
  Administered 2023-07-27 – 2023-08-03 (×21): 10 mg via ORAL
  Filled 2023-07-27 (×21): qty 1

## 2023-07-27 MED ORDER — OXYCODONE HCL 5 MG/5ML PO SOLN: 5.0000 mg | Freq: Once | ORAL | Status: DC | PRN

## 2023-07-27 SURGICAL SUPPLY — 65 items
BAG COUNTER SPONGE SURGICOUNT (BAG) ×1 IMPLANT
BASKET BONE COLLECTION (BASKET) ×1 IMPLANT
BENZOIN TINCTURE PRP APPL 2/3 (GAUZE/BANDAGES/DRESSINGS) ×1 IMPLANT
BLADE BONE MILL MEDIUM (MISCELLANEOUS) ×1 IMPLANT
BLADE CLIPPER SURG (BLADE) IMPLANT
BLADE SURG 11 STRL SS (BLADE) ×1 IMPLANT
BONE VIVIGEN FORMABLE 5.4CC (Bone Implant) ×1 IMPLANT
BUR CUTTER 7.0 ROUND (BURR) ×1 IMPLANT
BUR MATCHSTICK NEURO 3.0 LAGG (BURR) ×1 IMPLANT
CANISTER SUCT 3000ML PPV (MISCELLANEOUS) ×1 IMPLANT
CAP RELINE MOD TULIP RMM (Cap) IMPLANT
CNTNR URN SCR LID CUP LEK RST (MISCELLANEOUS) ×1 IMPLANT
COVER BACK TABLE 60X90IN (DRAPES) ×1 IMPLANT
DERMABOND ADVANCED .7 DNX12 (GAUZE/BANDAGES/DRESSINGS) ×1 IMPLANT
DRAPE C-ARM 42X72 X-RAY (DRAPES) ×1 IMPLANT
DRAPE C-ARMOR (DRAPES) IMPLANT
DRAPE HALF SHEET 40X57 (DRAPES) IMPLANT
DRAPE LAPAROTOMY 100X72X124 (DRAPES) ×1 IMPLANT
DRAPE SURG 17X23 STRL (DRAPES) ×1 IMPLANT
DRSG OPSITE 4X5.5 SM (GAUZE/BANDAGES/DRESSINGS) ×1 IMPLANT
DRSG OPSITE POSTOP 4X6 (GAUZE/BANDAGES/DRESSINGS) ×1 IMPLANT
DRSG OPSITE POSTOP 4X8 (GAUZE/BANDAGES/DRESSINGS) IMPLANT
DURAPREP 26ML APPLICATOR (WOUND CARE) ×1 IMPLANT
ELECT REM PT RETURN 9FT ADLT (ELECTROSURGICAL) ×1
ELECTRODE REM PT RTRN 9FT ADLT (ELECTROSURGICAL) ×1 IMPLANT
EVACUATOR 1/8 PVC DRAIN (DRAIN) IMPLANT
EVACUATOR 3/16 PVC DRAIN (DRAIN) ×1 IMPLANT
GAUZE 4X4 16PLY ~~LOC~~+RFID DBL (SPONGE) IMPLANT
GAUZE SPONGE 4X4 12PLY STRL (GAUZE/BANDAGES/DRESSINGS) ×1 IMPLANT
GLOVE BIO SURGEON STRL SZ7 (GLOVE) IMPLANT
GLOVE BIO SURGEON STRL SZ8 (GLOVE) ×2 IMPLANT
GLOVE BIOGEL PI IND STRL 7.0 (GLOVE) IMPLANT
GLOVE EXAM NITRILE XL STR (GLOVE) IMPLANT
GLOVE INDICATOR 8.5 STRL (GLOVE) ×2 IMPLANT
GOWN STRL REUS W/ TWL LRG LVL3 (GOWN DISPOSABLE) IMPLANT
GOWN STRL REUS W/ TWL XL LVL3 (GOWN DISPOSABLE) ×2 IMPLANT
GOWN STRL REUS W/TWL 2XL LVL3 (GOWN DISPOSABLE) IMPLANT
GRAFT BNE MATRIX VG FRMBL MD 5 (Bone Implant) IMPLANT
KIT BASIN OR (CUSTOM PROCEDURE TRAY) ×1 IMPLANT
KIT TURNOVER KIT B (KITS) ×1 IMPLANT
MILL BONE PREP (MISCELLANEOUS) ×1 IMPLANT
NDL HYPO 21X1.5 SAFETY (NEEDLE) ×1 IMPLANT
NDL HYPO 25X1 1.5 SAFETY (NEEDLE) ×1 IMPLANT
NEEDLE HYPO 21X1.5 SAFETY (NEEDLE) ×1 IMPLANT
NEEDLE HYPO 25X1 1.5 SAFETY (NEEDLE) ×1 IMPLANT
NS IRRIG 1000ML POUR BTL (IV SOLUTION) ×1 IMPLANT
PACK LAMINECTOMY NEURO (CUSTOM PROCEDURE TRAY) ×1 IMPLANT
PAD ARMBOARD 7.5X6 YLW CONV (MISCELLANEOUS) ×3 IMPLANT
ROD RELINE COCR LORD 5.0X70MM (Rod) IMPLANT
SCREW LOCK RSS 4.5/5.0MM (Screw) IMPLANT
SCREW SHANK RELINE MOD 5.0X30 (Screw) IMPLANT
SCREW SHANK RELINE MOD 5.0X35 (Screw) IMPLANT
SPACER SUSTAIN RT TI 8X22X11 8 (Spacer) IMPLANT
SPIKE FLUID TRANSFER (MISCELLANEOUS) ×1 IMPLANT
SPONGE SURGIFOAM ABS GEL 100 (HEMOSTASIS) ×1 IMPLANT
SPONGE T-LAP 4X18 ~~LOC~~+RFID (SPONGE) IMPLANT
STRIP CLOSURE SKIN 1/2X4 (GAUZE/BANDAGES/DRESSINGS) ×2 IMPLANT
SUT VIC AB 0 CT1 18XCR BRD8 (SUTURE) ×1 IMPLANT
SUT VIC AB 2-0 CT1 18 (SUTURE) ×1 IMPLANT
SUT VIC AB 4-0 PS2 27 (SUTURE) ×1 IMPLANT
SYR 20ML LL LF (SYRINGE) ×1 IMPLANT
TOWEL GREEN STERILE (TOWEL DISPOSABLE) ×1 IMPLANT
TOWEL GREEN STERILE FF (TOWEL DISPOSABLE) ×1 IMPLANT
TRAY FOLEY MTR SLVR 16FR STAT (SET/KITS/TRAYS/PACK) ×1 IMPLANT
WATER STERILE IRR 1000ML POUR (IV SOLUTION) ×1 IMPLANT

## 2023-07-27 NOTE — Anesthesia Postprocedure Evaluation (Signed)
Anesthesia Post Note  Patient: Valerie Vazquez  Procedure(s) Performed: POSTERIOR LUMBAR INTERBODY FUSION-LUMBAR THREE-LUMBAR FOUR-LUMBAR FOUR-LUMBAR FIVE, POSTERIOR LATERAL AND INTERBODY FUSION (Back)     Patient location during evaluation: PACU Anesthesia Type: General Level of consciousness: awake and alert Pain management: pain level controlled Vital Signs Assessment: post-procedure vital signs reviewed and stable Respiratory status: spontaneous breathing, nonlabored ventilation, respiratory function stable and patient connected to nasal cannula oxygen Cardiovascular status: blood pressure returned to baseline and stable Postop Assessment: no apparent nausea or vomiting Anesthetic complications: no   There were no known notable events for this encounter.  Last Vitals:  Vitals:   07/27/23 1330 07/27/23 1404  BP: 121/64 133/70  Pulse: 62 73  Resp: (!) 8 16  Temp:  36.5 C  SpO2: 96% 92%    Last Pain:  Vitals:   07/27/23 1404  TempSrc: Oral  PainSc:                  [place]

## 2023-07-27 NOTE — Transfer of Care (Signed)
Immediate Anesthesia Transfer of Care Note  Patient: ROBI EAVEY  Procedure(s) Performed: POSTERIOR LUMBAR INTERBODY FUSION-LUMBAR THREE-LUMBAR FOUR-LUMBAR FOUR-LUMBAR FIVE, POSTERIOR LATERAL AND INTERBODY FUSION (Back)  Patient Location: PACU  Anesthesia Type:General  Level of Consciousness: drowsy and responds to stimulation  Airway & Oxygen Therapy: Patient Spontanous Breathing and Patient connected to face mask oxygen  Post-op Assessment: Report given to RN, Post -op Vital signs reviewed and stable, and Patient moving all extremities  Post vital signs: Reviewed and stable  Last Vitals:  Vitals Value Taken Time  BP 142/73 07/27/23 1150  Temp    Pulse 82 07/27/23 1154  Resp 16 07/27/23 1154  SpO2 98 % 07/27/23 1154  Vitals shown include unfiled device data.  Last Pain:  Vitals:   07/27/23 0617  TempSrc:   PainSc: 5          Complications: There were no known notable events for this encounter.

## 2023-07-27 NOTE — Anesthesia Procedure Notes (Signed)
Procedure Name: Intubation Date/Time: 07/27/2023 7:43 AM  Performed by: Quincy Carnes, CRNAPre-anesthesia Checklist: Patient identified, Emergency Drugs available, Suction available and Patient being monitored Patient Re-evaluated:Patient Re-evaluated prior to induction Oxygen Delivery Method: Circle system utilized Preoxygenation: Pre-oxygenation with 100% oxygen Induction Type: IV induction Ventilation: Mask ventilation without difficulty Laryngoscope Size: Miller and 2 Grade View: Grade I Tube type: Oral Tube size: 7.0 mm Number of attempts: 1 Airway Equipment and Method: Stylet and Oral airway Placement Confirmation: ETT inserted through vocal cords under direct vision, positive ETCO2 and breath sounds checked- equal and bilateral Secured at: 22 cm Tube secured with: Tape Dental Injury: Teeth and Oropharynx as per pre-operative assessment

## 2023-07-27 NOTE — H&P (Signed)
Valerie Vazquez is an 67 y.o. female.   Chief Complaint: Back and bilateral hip and leg pain neurogenic claudication HPI: 66 year old female with severe back pain and leg pain difficulty walking workup revealed severe spinal stenosis L3-4 L4-5 with a dynamic spondylolisthesis at L4-5.  Due to patient's progression of clinical syndrome imaging findings of a conservative treatment I recommended decompressive laminectomy interbody fusions at both those levels.  I extensively reviewed the risks and benefits of the operation with her as well as perioperative course expectations of outcome and alternatives to surgery and she understood and agreed to proceed forward.  Past Medical History:  Diagnosis Date   Acute right-sided low back pain with right-sided sciatica 10/23/2017   Anemia    Anginal pain (HCC)    Bilateral ovarian cysts    Bilateral renal cysts    Bulimia nervosa 05/07/2021   CAD (coronary artery disease) 09/28/2016   Chest pain 10/14/2015   CHF (congestive heart failure) (HCC)    Diastolic   Chronic back pain    "upper and lower" (09/19/2014)   Chronic shoulder pain (Location of Primary Source of Pain) (Left) 01/29/2016   Colitis 04/08/2016   Colon polyp 01/30/2017   Coronary vasospasm (HCC) 12/09/2014   DDD (degenerative disc disease), cervical    a.) s/p ACDF C5-C7   Depression    DVT (deep venous thrombosis) (HCC)    Esophageal spasm    Essential hypertension 10/28/2013   Gallstones    Gastritis    Gastroenteritis    GERD (gastroesophageal reflux disease)    Heart disease 12/09/2016   High blood pressure    High cholesterol    History of blood transfusion 1985   related to hysterectomy   History of left heart catheterization (LHC) 12/09/2014   a.) LHC 12/09/2014: EF 50%; normal coronaries.   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 Rule out Paget's disease of the bone with blood work.    Hyperlipidemia 10/28/2013   Hypertension    Hypothyroidism    IBS (irritable bowel syndrome)    Long term current use of anticoagulant    a.) apixaban   Lumbar central spinal stenosis (moderate at L4-5; mild at L2-3 and L3-4) 01/29/2016   Migraine    "a few times/yr" (09/19/2014)   Myocardial infarction (HCC) 10/2010 X 3   "while hospitalized"   Neck pain 01/29/2016   Osteoarthritis    Peripheral vascular disease (HCC)    DVT/PE. On Eliquis   Pleural effusion, bilateral 07/31/2015   Pneumonia 04/2014   PONV (postoperative nausea and vomiting)    PTSD (post-traumatic stress disorder)    Hx. None currently (2024)   Pulmonary embolism (HCC) 04/2001; 09/19/2014   "after gallbladder OR; "   Schizoaffective disorder, depressive type (HCC) 08/28/2016   Sleep apnea    "mild" (09/19/2014)   Snoring    a. sleep study 5/16: No OSA   SOB (shortness of breath) 07/31/2015   Spinal stenosis    Spondylosis    Suicidal ideation 08/27/2016   a.) seen in ED with approx 6 months of reported SI. Reported ingestion of "unknown pills" to end life; last ingestion was 3 days PTA; UDS (+) for TCAs. SI related to depression, friend (loss of friend), and social determinants   Type II  diabetes mellitus (HCC)    "dx'd in 2007; lost weight; no RX for ~ 4 yr now" (09/19/2014)    Past Surgical History:  Procedure Laterality Date   ANTERIOR CERVICAL DECOMP/DISCECTOMY FUSION  10/2012   in [place] C5-C7    APPENDECTOMY  1985   BACK SURGERY     BREAST CYST EXCISION Right    BREAST EXCISIONAL BIOPSY Right YRS AGO   NEG   CARDIAC CATHETERIZATION   2000's; 2009   CHOLECYSTECTOMY  2002   COLONOSCOPY WITH PROPOFOL N/A 12/12/2016   Procedure: COLONOSCOPY WITH PROPOFOL;  Surgeon: Wyline Mood, MD;  Location: [facility] ENDOSCOPY;  Service: Endoscopy;  Laterality: N/A;   COLONOSCOPY WITH PROPOFOL N/A  02/17/2017   Procedure: COLONOSCOPY WITH PROPOFOL;  Surgeon: Wyline Mood, MD;  Location: [facility] ENDOSCOPY;  Service: Gastroenterology;  Laterality: N/A;   COLONOSCOPY WITH PROPOFOL N/A 11/04/2021   Procedure: COLONOSCOPY WITH PROPOFOL;  Surgeon: Wyline Mood, MD;  Location[facility] ENDOSCOPY;  Service: Gastroenterology;  Laterality: N/A;   CYSTOSCOPY N/A 05/15/2022   Procedure: CYSTOSCOPY;  Surgeon: Conard Novak, MD;  Location: [facility] ORS;  Service: Gynecology;  Laterality: N/A;   ESOPHAGOGASTRODUODENOSCOPY (EGD) WITH PROPOFOL N/A 02/17/2017   Procedure: ESOPHAGOGASTRODUODENOSCOPY (EGD) WITH PROPOFOL;  Surgeon: Wyline Mood, MD;  Location: [facility] ENDOSCOPY;  Service: Gastroenterology;  Laterality: N/A;   ESOPHAGOGASTRODUODENOSCOPY (EGD) WITH PROPOFOL N/A 03/07/2019   Procedure: ESOPHAGOGASTRODUODENOSCOPY (EGD) WITH PROPOFOL;  Surgeon: Wyline Mood, MD;  Location: [facility] ENDOSCOPY;  Service: Gastroenterology;  Laterality: N/A;   EXCISION/RELEASE BURSA HIP Right 12/1984   HERNIA REPAIR  1985   IVC FILTER REMOVAL     2020s   LEFT HEART CATHETERIZATION WITH CORONARY ANGIOGRAM N/A 12/09/2014   Procedure: LEFT HEART CATHETERIZATION WITH CORONARY ANGIOGRAM;  Surgeon: Kathleene Hazel, MD;  Location: [facility] CATH LAB;  Service: Cardiovascular;  Laterality: N/A;   PARTIAL HYSTERECTOMY  1985   ROBOTIC ASSISTED BILATERAL SALPINGO OOPHERECTOMY Bilateral 05/15/2022   Procedure: XI ROBOTIC ASSISTED BILATERAL OOPHORECTOMY;  Surgeon: Conard Novak, MD;  Location: [facility] ORS;  Service: Gynecology;  Laterality: Bilateral;   TONSILLECTOMY AND ADENOIDECTOMY  ~ 1968   TOTAL HIP ARTHROPLASTY Left 04/06/2014   UMBILICAL HERNIA REPAIR  1985   VENA CAVA FILTER PLACEMENT  03/2014    Family History  Problem Relation Age of Onset   Stroke Mother        Deceased   Lung cancer Father        Deceased   Healthy Daughter    Healthy Son        x 2   Other Son        Suicide   Cancer Brother    Heart attack Neg Hx     Social History:  reports that she quit smoking about 44 years ago. Her smoking use included cigarettes. She started smoking about 54 years ago. She has a 15 pack-year smoking history. She has never used smokeless tobacco. She reports that she does not drink alcohol and does not use drugs.  Allergies:  Allergies  Allergen Reactions   Other Hives, Itching and Swelling    Hair Dye - Causes severe sleepiness    Abilify [Aripiprazole] Other (See Comments)    Jerking movement, slow motor skills   Augmentin [Amoxicillin-Pot Clavulanate] Diarrhea and Nausea And Vomiting   Bactrim [Sulfamethoxazole-Trimethoprim] Diarrhea   Ceftin [Cefuroxime Axetil] Diarrhea   Coconut (Cocos Nucifera)     Rash    Diclofenac Other (See Comments)     Muscle Pain   Indocin [  Indomethacin] Other (See Comments)    Migraines    Linaclotide Diarrhea   Lisinopril Diarrhea and Other (See Comments)    Dizziness, Vomiting   Morphine Other (See Comments)    Chest Tightness   Morphine And Codeine Other (See Comments)    Chest tightness    Simvastatin Other (See Comments)    Muscle Pain   Sulfa Antibiotics Diarrhea   Wellbutrin [Bupropion] Other (See Comments)    Jerking movement, slurred speech   Zetia [Ezetimibe]     Diarrhea    Seroquel [Quetiapine Fumarate] Palpitations    Medications Prior to Admission  Medication Sig Dispense Refill   acetaminophen (TYLENOL) 500 MG tablet Take 1 tablet (500 mg total) by mouth every 6 (six) hours as needed. 30 tablet 0   apixaban (ELIQUIS) 5 MG TABS tablet Take 1 tablet (5 mg total) by mouth 2 (two) times daily. 180 tablet 1   baclofen (LIORESAL) 10 MG tablet TAKE ONE TABLET BY MOUTH THREE TIMES DAILY AS NEEDED FOR MUSCLE SPASMS (VIAL) 60 tablet 11   cyanocobalamin (VITAMIN B12) 1000 MCG tablet Take 1,000 mcg by mouth daily. Gummy     dicyclomine (BENTYL) 20 MG tablet TAKE ONE TABLET BY MOUTH THREE TIMES DAILY AS NEEDED FOR ABDOMINAL SPASMS (VIAL) 270 tablet 0    escitalopram (LEXAPRO) 20 MG tablet TAKE ONE TABLET BY MOUTH DAILY AT 9AM 90 tablet 0   famotidine (PEPCID) 20 MG tablet Take 20 mg by mouth at bedtime.     ferrous sulfate 325 (65 FE) MG tablet Take 325 mg by mouth daily with breakfast.     fluticasone (FLONASE) 50 MCG/ACT nasal spray SPRAY 2 SPRAYS INTO EACH NOSTRIL EVERY DAY 48 mL 2   gabapentin (NEURONTIN) 600 MG tablet TAKE ONE TABLET BY MOUTH DAILY AT 9AM and TAKE 1 AND 1/2 TABLETS BY MOUTH DAILY AT 9PM (VIAL) (Patient taking differently: Take 600-900 mg by mouth See admin instructions. TAKE ONE TABLET BY MOUTH DAILY AT 9AM and TAKE 1 AND 1/2 TABLETS BY MOUTH DAILY AT 9PM (VIAL)) 225 tablet 11   isosorbide mononitrate (IMDUR) 30 MG 24 hr tablet Take 0.5 tablets (15 mg total) by mouth daily.     JARDIANCE 25 MG TABS tablet TAKE 1 TABLET (25 MG TOTAL) BY MOUTH DAILY. 90 tablet 3   levothyroxine (SYNTHROID) 25 MCG tablet TAKE ONE TABLET BY MOUTH DAILY AT 7AM BEFORE BREAKFAST AND 2-3 HOURS BEFORE TAKING ANY OTHER MEDICATION OR SUPPLEMENT 90 tablet 11   Magnesium 200 MG TABS Take 200 mg by mouth daily.     Multiple Vitamins-Minerals (MULTIVITAMIN GUMMIES ADULT) CHEW Chew 2 each by mouth daily.     OLANZapine (ZYPREXA) 10 MG tablet TAKE 1 TABLET BY MOUTH EVERYDAY AT BEDTIME 90 tablet 0   ondansetron (ZOFRAN-ODT) 4 MG disintegrating tablet Take 1 tablet (4 mg total) by mouth every 6 (six) hours as needed for nausea. 45 tablet 2   OVER THE COUNTER MEDICATION Take 2 each by mouth daily. Power C gummies     pantoprazole (PROTONIX) 40 MG tablet Take 1 tablet (40 mg total) by mouth daily. 90 tablet 1   Probiotic Product (PROBIOTIC BLEND) CAPS Take 1 capsule by mouth daily.     rosuvastatin (CRESTOR) 40 MG tablet TAKE 1 TABLET BY MOUTH EVERY DAY (Patient taking differently: Take 40 mg by mouth at bedtime.) 90 tablet 1   Semaglutide,0.25 or 0.5MG /DOS, (OZEMPIC, 0.25 OR 0.5 MG/DOSE,) 2 MG/3ML SOPN INJECT 0.5 MG INTO THE SKIN ONE TIME PER  WEEK 3 mL 2    sucralfate (CARAFATE) 1 g tablet Take 1 tablet (1 g total) by mouth 3 (three) times daily before meals. 90 tablet 2   Zinc Citrate (ZINC GUMMY PO) Take 2 each by mouth daily.     blood glucose meter kit and supplies KIT Dispense based on patient and insurance preference. Use once daily fasting. Dx code E11.9. 1 each 0   Blood Glucose Monitoring Suppl (GHT BLOOD GLUCOSE MONITOR) w/Device KIT DISPENSE BASED ON PATIENT AND INSURANCE PREFERENCE. USE ONCE DAILY AS DIRECTED. DX CODE E11.9. 1 kit 0   estradiol (ESTRACE) 0.1 MG/GM vaginal cream Place vaginally at bedtime. 1 application placed on outside     furosemide (LASIX) 20 MG tablet TAKE ONE TABLET BY MOUTH DAILY AS NEEDED FOR EDEMA (VIAL) 90 tablet 3   nitroGLYCERIN (NITROSTAT) 0.4 MG SL tablet Place 1 tablet (0.4 mg total) under the tongue every 5 (five) minutes as needed for chest pain. 25 tablet 3   potassium chloride SA (KLOR-CON M) 20 MEQ tablet TAKE ONE TABLET BY MOUTH DAILY AS NEEDED TAKE WITH LASIX (VIAL) 90 tablet 11   triamcinolone cream (KENALOG) 0.5 % Apply 1 Application topically 2 (two) times daily as needed (irritation).      Results for orders placed or performed during the hospital encounter of 07/27/23 (from the past 48 hour(s))  Glucose, capillary     Status: Abnormal   Collection Time: 07/27/23  5:53 AM  Result Value Ref Range   Glucose-Capillary 104 (H) 70 - 99 mg/dL    Comment: Glucose reference range applies only to samples taken after fasting for at least 8 hours.   *Note: Due to a large number of results and/or encounters for the requested time period, some results have not been displayed. A complete set of results can be found in Results Review.   No results found.  Review of Systems  Musculoskeletal:  Positive for back pain.  Neurological:  Positive for numbness.    Blood pressure 133/71, pulse 73, temperature 98.9 F (37.2 C), temperature source Oral, resp. rate 18, height 5\' 6"  (1.676 m), weight 88.5 kg, SpO2  96%. Physical Exam HENT:     Head: Normocephalic.     Right Ear: Tympanic membrane normal.     Nose: Nose normal.     Mouth/Throat:     Mouth: Mucous membranes are moist.  Eyes:     Pupils: Pupils are equal, round, and reactive to light.  Cardiovascular:     Rate and Rhythm: Normal rate.     Pulses: Normal pulses.  Pulmonary:     Effort: Pulmonary effort is normal.  Abdominal:     General: Abdomen is flat.  Musculoskeletal:        General: Normal range of motion.     Cervical back: Normal range of motion.  Neurological:     Mental Status: She is alert.     Comments: Strength is 5-5 iliopsoas, quads, hamstrings, gastrocs, into tibialis, and EHL.      Assessment/Plan 67 year old presents for decompression stabilization procedure L3-4 L4-5  Mariam Dollar, MD 07/27/2023, 7:24 AM

## 2023-07-27 NOTE — Evaluation (Signed)
Occupational Therapy Evaluation Patient Details Name: Valerie Vazquez MRN: [id number] DOB: August 21, 1956 Today's Date: 07/27/2023   History of Present Illness 67 y.o. female with history of diabetes, coronary vasospasm, DVT.  S/p Lumbar 3,4,5 PLIF.   Clinical Impression   Patient admitted for the procedure above.  PTA she lives at home alone in her apartment.  Her SO is only available PRN.  Patient has a 4WRW and a 2WRW that she will use, but generally needed no assist with ADL completion and occasional assist with iADL/meals depending on her back pain.  Currently she is needing up to Min A for basic transfers at [facility] level and closer to Mod A for lower body ADL.  Patient does not believe she has adequate assist at home to transition directly home, Patient will benefit from continued inpatient follow up therapy, <3 hours/day.  OT will follow in the acute setting to address deficits.          If plan is discharge home, recommend the following: Assist for transportation;Assistance with cooking/housework;A little help with walking and/or transfers;A lot of help with bathing/dressing/bathroom    Functional Status Assessment  Patient has had a recent decline in their functional status and demonstrates the ability to make significant improvements in function in a reasonable and predictable amount of time.  Equipment Recommendations  Tub/shower seat    Recommendations for Other Services       Precautions / Restrictions Precautions Precautions: Fall;Back Precaution Booklet Issued: Yes (comment) Restrictions Weight Bearing Restrictions: No      Mobility Bed Mobility Overal bed mobility: Needs Assistance Bed Mobility: Sidelying to Sit, Sit to Sidelying   Sidelying to sit: Min assist     Sit to sidelying: Min assist      Transfers Overall transfer level: Needs assistance   Transfers: Sit to/from Stand, Bed to chair/wheelchair/BSC Sit to Stand: Contact guard assist, Min assist      Step pivot transfers: Contact guard assist, Min assist            Balance Overall balance assessment: Needs assistance Sitting-balance support: Feet supported Sitting balance-Leahy Scale: Good     Standing balance support: Reliant on assistive device for balance Standing balance-Leahy Scale: Poor                             ADL either performed or assessed with clinical judgement   ADL Overall ADL's : Needs assistance/impaired Eating/Feeding: Independent;Sitting   Grooming: Wash/dry hands;Wash/dry face;Set up;Sitting   Upper Body Bathing: Contact guard assist;Sitting   Lower Body Bathing: Moderate assistance;Sit to/from stand   Upper Body Dressing : Minimal assistance;Sitting   Lower Body Dressing: Moderate assistance;Sit to/from stand   Toilet Transfer: Minimal assistance;Rolling walker (2 wheels);Regular Toilet;Ambulation                   Vision Patient Visual Report: No change from baseline       Perception Perception: Within Functional Limits       Praxis Praxis: WFL       Pertinent Vitals/Pain Pain Assessment Pain Assessment: Faces Faces Pain Scale: Hurts little more Pain Location: Incisional Pain Descriptors / Indicators: Discomfort, Grimacing, Guarding Pain Intervention(s): Monitored during session     Extremity/Trunk Assessment Upper Extremity Assessment Upper Extremity Assessment: Overall WFL for tasks assessed   Lower Extremity Assessment Lower Extremity Assessment: Defer to PT evaluation   Cervical / Trunk Assessment Cervical / Trunk Assessment: Back Surgery  Communication Communication Communication: No apparent difficulties   Cognition Arousal: Alert Behavior During Therapy: WFL for tasks assessed/performed Overall Cognitive Status: Within Functional Limits for tasks assessed                                       General Comments   VSS on RA    Exercises     Shoulder Instructions       Home Living Family/patient expects to be discharged to:: Private residence Living Arrangements: Alone Available Help at Discharge: Friend(s);Available PRN/intermittently Type of Home: Apartment Home Access: Stairs to enter Entrance Stairs-Number of Steps: 3 steps in the back   Home Layout: One level     Bathroom Shower/Tub: Chief Strategy Officer: Standard Bathroom Accessibility: Yes How Accessible: Accessible via walker Home Equipment: Rolling Walker (2 wheels);Rollator (4 wheels)          Prior Functioning/Environment Prior Level of Function : Independent/Modified Independent;Driving                        OT Problem List: Decreased strength;Decreased activity tolerance;Impaired balance (sitting and/or standing);Pain      OT Treatment/Interventions: Self-care/ADL training;Therapeutic activities;Patient/family education;Balance training;DME and/or AE instruction    OT Goals(Current goals can be found in the care plan section) Acute Rehab OT Goals Patient Stated Goal: Return home OT Goal Formulation: With patient Time For Goal Achievement: 08/10/23 Potential to Achieve Goals: Good ADL Goals Pt Will Perform Grooming: with modified independence;standing Pt Will Perform Lower Body Dressing: with modified independence;sit to/from stand;with adaptive equipment Pt Will Transfer to Toilet: with modified independence;ambulating;regular height toilet  OT Frequency: Min 1X/week    Co-evaluation              AM-PAC OT "6 Clicks" Daily Activity     Outcome Measure Help from another person eating meals?: None Help from another person taking care of personal grooming?: A Little Help from another person toileting, which includes using toliet, bedpan, or urinal?: A Little Help from another person bathing (including washing, rinsing, drying)?: A Lot Help from another person to put on and taking off regular upper body clothing?: A Little Help from  another person to put on and taking off regular lower body clothing?: A Lot 6 Click Score: 17   End of Session Equipment Utilized During Treatment: Rolling walker (2 wheels) Nurse Communication: Mobility status  Activity Tolerance: Patient tolerated treatment well Patient left: in chair;with call bell/phone within reach;with chair alarm set  OT Visit Diagnosis: Unsteadiness on feet (R26.81);Muscle weakness (generalized) (M62.81)                Time: 1610-9604 OT Time Calculation (min): 29 min Charges:  OT General Charges $OT Visit: 1 Visit OT Evaluation $OT Eval Moderate Complexity: 1 Mod OT Treatments $Self Care/Home Management : 8-22 mins  07/27/2023  RP, OTR/L  Acute Rehabilitation Services  Office:  [phone]   Suzanna Obey 07/27/2023, 5:16 PM

## 2023-07-27 NOTE — Op Note (Signed)
Operative diagnosis: Lumbar spinal stenosis L3-4 grade 1 spondylolisthesis L4-5 with instability.  Bilateral L4-L5 radiculopathies and neurogenic claudication  Postoperative diagnosis: Same.  Procedure: #1 decompressive laminectomies L3-4 L4-5 with complete medial facetectomies and radical foraminotomies in excess and requiring more work than would be needed with a standard interbody fusion.  2.  Posterior lumbar interbody fusion utilizing the globus titanium cages packed with locally harvested autograft mixed with Vivigen.  3.  Cortical screw fixation utilizing the NuVasive modular cortical screw set.  4.  Posterolateral arthrodesis L3-4 L4-5 utilizing locally harvested autograft mixed with Vivigen.  Surgeon: Donalee Citrin.  Assistant: Julien Girt.  Anesthesia: General.  EBL: Minimal.  HPI: 67 year old female with longstanding back bilateral hip and leg pain difficulty walking and claudication.  Workup revealed severe spinal stenosis L3-4 L4-5 grade 1 spondylolisthesis at L4-5 with motion of flexion extension films.  Due to her failure conservative treatment imaging findings and progression of clinical syndrome I recommended decompressive laminectomies interbody fusions at those 2 levels.  I extensively reviewed the risks and benefits of the operation with her as well as perioperative course expectations of outcome and alternatives of surgery and she understood and agreed to proceed forward.  Operative procedure: Patient was brought into the OR was induced under general anesthesia positioned prone the Wilson frame her back was prepped and draped in routine sterile fashion preoperative x-ray localized the appropriate level so after infiltration of 10 cc lidocaine with epi midline incision was made and Bovie electrocautery was used to take down the subcutaneous tissue and subperiosteal dissection was carried lamina of L3-L4 and L5 bilaterally.  Interoperative x-ray confirmed indication  appropriate level.  So the facet joints were drilled in with a high-speed drill capturing the bone shavings and mucus trap spinous processes at L3 and L4 were removed central decompression was begun.  Marked hourglass compression of thecal sac was going on at both levels we performed complete medial facetectomies and radical laminotomies the L3-L4 and L5 nerve roots bilaterally aggressively under biting the superior tickling facet at both levels.  This allowed access to the lateral margin of the space which was incised and cleaned out bilaterally then under fluoroscopy complete discectomies were performed endplate preparation was carried out and cages were inserted at L3-4 and L4-5 with an extensive mount of autograft mix centrally.  After all the interbody work to be done tension taken cortical screw placement under fluoroscopy 6 cortical screws were placed all screws had excellent purchase.  Then reinspected the foramina to confirm patency and no migration of graft material assembled the heads advanced the screws anchored all the rods in place compressing L3 against L4 and L4 against L5.  I then aggressively decorticated the lateral facet joints and TPs at L3-4 and L4-5 and packed the remainder of the locally harvested autograft mix posterior laterally at those levels.  All the foramina were again reinspected Gelfoam was ON top of the dura a medium Hemovac drain was placed and Exparel was injected fascia.  Wound was then closed primarily with interrupted Vicryl and a running 4 subcuticular.  Dermabond benzoin Steri-Strips and sterile dressing was applied patient recovery in stable condition.  At the end the case all needle count sponge counts were correct.  My assistant was assistant Cala Bradford was present for all critical parts of the case.

## 2023-07-28 LAB — CBC WITH DIFFERENTIAL/PLATELET
Abs Immature Granulocytes: 0.04 10*3/uL (ref 0.00–0.07)
Basophils Absolute: 0 10*3/uL (ref 0.0–0.1)
Basophils Relative: 0 %
Eosinophils Absolute: 0 10*3/uL (ref 0.0–0.5)
Eosinophils Relative: 0 %
HCT: 28.4 % — ABNORMAL LOW (ref 36.0–46.0)
Hemoglobin: 9.3 g/dL — ABNORMAL LOW (ref 12.0–15.0)
Immature Granulocytes: 0 %
Lymphocytes Relative: 19 %
Lymphs Abs: 2.3 10*3/uL (ref 0.7–4.0)
MCH: 31.7 pg (ref 26.0–34.0)
MCHC: 32.7 g/dL (ref 30.0–36.0)
MCV: 96.9 fL (ref 80.0–100.0)
Monocytes Absolute: 1 10*3/uL (ref 0.1–1.0)
Monocytes Relative: 8 %
Neutro Abs: 8.6 10*3/uL — ABNORMAL HIGH (ref 1.7–7.7)
Neutrophils Relative %: 73 %
Platelets: 225 10*3/uL (ref 150–400)
RBC: 2.93 MIL/uL — ABNORMAL LOW (ref 3.87–5.11)
RDW: 13.2 % (ref 11.5–15.5)
WBC: 12 10*3/uL — ABNORMAL HIGH (ref 4.0–10.5)
nRBC: 0 % (ref 0.0–0.2)

## 2023-07-28 LAB — BASIC METABOLIC PANEL
Anion gap: 8 (ref 5–15)
BUN: 11 mg/dL (ref 8–23)
CO2: 30 mmol/L (ref 22–32)
Calcium: 8.8 mg/dL — ABNORMAL LOW (ref 8.9–10.3)
Chloride: 106 mmol/L (ref 98–111)
Creatinine, Ser: 0.95 mg/dL (ref 0.44–1.00)
GFR, Estimated: >60 mL/min (ref >60)
Glucose, Bld: 113 mg/dL — ABNORMAL HIGH (ref 70–99)
Potassium: 3.7 mmol/L (ref 3.5–5.1)
Sodium: 144 mmol/L (ref 135–145)

## 2023-07-28 LAB — GLUCOSE, CAPILLARY
Glucose-Capillary: 115 mg/dL — ABNORMAL HIGH (ref 70–99)
Glucose-Capillary: 148 mg/dL — ABNORMAL HIGH (ref 70–99)
Glucose-Capillary: 109 mg/dL — ABNORMAL HIGH (ref 70–99)
Glucose-Capillary: 144 mg/dL — ABNORMAL HIGH (ref 70–99)

## 2023-07-28 MED ORDER — SODIUM CHLORIDE 0.9 % IV BOLUS
1000.0000 mL | Freq: Once | INTRAVENOUS | Status: AC
  Administered 2023-07-28: 1000 mL via INTRAVENOUS

## 2023-07-28 MED ORDER — POLYETHYLENE GLYCOL 3350 17 G PO PACK
17.0000 g | PACK | Freq: Every day | ORAL | Status: DC | PRN
  Administered 2023-07-28 – 2023-08-01 (×2): 17 g via ORAL
  Filled 2023-07-28 (×2): qty 1

## 2023-07-28 NOTE — Progress Notes (Signed)
Subjective: Patient reports back pain but no leg pain   Objective: Vital signs in last 24 hours: Temp:  [97.7 F (36.5 C)-98.7 F (37.1 C)] 98.7 F (37.1 C) (11/26 0745) Pulse Rate:  [62-83] 71 (11/26 0745) Resp:  [8-21] 20 (11/26 0745) BP: (121-157)/(64-85) 130/73 (11/26 0745) SpO2:  [92 %-100 %] 93 % (11/26 0745)  Intake/Output from previous day: 11/25 0701 - 11/26 0700 In: 1120 [P.O.:120; I.V.:800; IV Piggyback:200] Out: 920 [Urine:485; Drains:235; Blood:200] Intake/Output this shift: No intake/output data recorded.  Neurologic: Grossly normal  Lab Results: Lab Results  Component Value Date   WBC 6.6 07/22/2023   HGB 11.5 (L) 07/22/2023   HCT 35.6 (L) 07/22/2023   MCV 98.6 07/22/2023   PLT 239 07/22/2023   Lab Results  Component Value Date   INR 1.2 06/01/2020   BMET Lab Results  Component Value Date   NA 141 07/22/2023   K 3.8 07/22/2023   CL 108 07/22/2023   CO2 23 07/22/2023   GLUCOSE 90 07/22/2023   BUN <5 (L) 07/22/2023   CREATININE 0.81 07/22/2023   CALCIUM 8.9 07/22/2023    Studies/Results: DG Lumbar Spine 2-3 Views  Result Date: 07/27/2023 CLINICAL DATA:  Elective surgery. EXAM: LUMBAR SPINE - 2-3 VIEW COMPARISON:  Preoperative imaging. FINDINGS: Four fluoroscopic spot views of the lumbar spine obtained in the operating room. Pedicle screws at L3, L4, and L5 with interbody spacers. Fluoroscopy time 1 minutes 1 second. Dose 49.32 mGy. IMPRESSION: Intraoperative fluoroscopy during lumbar fusion. Electronically Signed   By: Narda Rutherford M.D.   On: 07/27/2023 12:02   DG C-Arm 1-60 Min-No Report  Result Date: 07/27/2023 Fluoroscopy was utilized by the requesting physician.  No radiographic interpretation.   DG C-Arm 1-60 Min-No Report  Result Date: 07/27/2023 Fluoroscopy was utilized by the requesting physician.  No radiographic interpretation.   DG C-Arm 1-60 Min-No Report  Result Date: 07/27/2023 Fluoroscopy was utilized by the  requesting physician.  No radiographic interpretation.    Assessment/Plan: Postop day 1 two level PLIF, will work on pain control and work with therapy today. Goal to ambulate hallways at least 3 times today   LOS: 1 day    Valerie Vazquez 07/28/2023, 7:47 AM

## 2023-07-28 NOTE — Plan of Care (Signed)

## 2023-07-28 NOTE — TOC Initial Note (Signed)
Transition of Care [facility]) - Initial/Assessment Note    Patient Details  Name: Valerie Vazquez MRN: [id number] Date of Birth: 1956-06-20  [facility] CM/SW Contact:    Eduard Roux, LCSW Phone Number: 07/28/2023, 4:11 PM  Clinical Narrative:           CSW met with patient at bedside. CSW introduced self and explained role. Patient states she is aware and understands recommendations for short term rehab. Patient states she lives home alone and is agreeable to rehab at [facility]. CSW explained SNF process. Preferred SNF is [facility]. No other questions at this time.   TOC will provide bed offer once available  TOC will continue to follow and assist with discharge planning.   Antony Blackbird, MSW, LCSW Clinical Social Worker            Expected Discharge Plan: Skilled Nursing Facility Barriers to Discharge: Continued Medical Work up   Patient Goals and CMS Choice            Expected Discharge Plan and Services In-house Referral: Clinical Social Work     Living arrangements for the past 2 months: Apartment                                      Prior Living Arrangements/Services Living arrangements for the past 2 months: Apartment Lives with:: Self Patient language and need for interpreter reviewed:: No        Need for Family Participation in Patient Care: Yes (Comment) Care giver support system in place?: Yes (comment)   Criminal Activity/Legal Involvement Pertinent to Current Situation/Hospitalization: No - Comment as needed  Activities of Daily Living   ADL Screening (condition at time of admission) Independently performs ADLs?: Yes (appropriate for developmental age) Is the patient deaf or have difficulty hearing?: No Does the patient have difficulty seeing, even when wearing glasses/contacts?: No Does the patient have difficulty concentrating, remembering, or making decisions?: No  Permission Sought/Granted Permission sought to share  information with : Family Supports Permission granted to share information with : Yes, Verbal Permission Granted  Share Information with NAME: Gretta Arab  Permission granted to share info w AGENCY: SNFs  Permission granted to share info w Relationship: sister  Permission granted to share info w Contact Information: [phone]  Emotional Assessment Appearance:: Appears stated age Attitude/Demeanor/Rapport: Engaged Affect (typically observed): Appropriate Orientation: : Oriented to Self, Oriented to Place, Oriented to  Time, Oriented to Situation Alcohol / Substance Use: Not Applicable Psych Involvement: No (comment)  Admission diagnosis:  Spondylolisthesis at L4-L5 level [M43.16] Patient Active Problem List   Diagnosis Date Noted   Spondylolisthesis at L4-L5 level 07/27/2023   Left leg numbness 05/20/2023   Thoracic back pain 03/13/2023   Obesity (BMI 30.0-34.9) 12/10/2022   Subcutaneous nodule 06/09/2022   Numbness of right foot 03/05/2022   OAB (overactive bladder) 03/05/2022   Ingrown fingernail 12/27/2021   Cramps, extremity 11/27/2021   Skin lesion of lower extremity- right knee  09/26/2021   Retention cyst of nasal sinus 08/27/2021   Rash 08/27/2021   Bulimia 01/14/2021   Cervical radiculitis 11/15/2020   Right arm pain 11/12/2020   Gross hematuria 09/14/2020   Bilateral low back pain without sciatica 06/20/2020   Paronychia of great toe of right foot 04/05/2020   Paronychia of great toe, right 02/27/2020   Foraminal stenosis of cervical region 01/31/2020   Cervical fusion  syndrome (C5-C7 2014) 01/31/2020   Ingrown toenail 01/27/2020   Chronic diastolic heart failure (HCC) 01/27/2020   Postmenopausal estrogen deficiency 08/12/2019   Left wrist pain 05/20/2019   Allergic rhinitis 01/27/2019   Pain of right lower extremity 10/06/2018   Urinary incontinence 10/27/2017   Elevated alkaline phosphatase level 10/27/2017   Hypothyroidism 08/26/2017   Diabetes  mellitus without complication (HCC) 05/29/2017   Right upper quadrant pain 01/12/2017   Plantar fasciitis of left foot 11/07/2016   CAD (coronary artery disease) 09/28/2016   Epigastric discomfort 09/16/2016   Schizoaffective disorder, depressive type (HCC) 08/28/2016   Cervicalgia 01/29/2016   Chronic low back pain (Location of Secondary source of pain) (Bilateral) (R>L) 01/29/2016   Chronic pain syndrome 01/29/2016   Primary osteoarthritis of right hip 01/29/2016   Lumbar central spinal stenosis (moderate at L4-5; mild at L2-3 and L3-4) 01/29/2016   Breast pain 10/19/2015   Abdominal pain 10/14/2015   Pleural effusion, bilateral 07/31/2015   Cough 07/31/2015   Esophageal spasm 06/05/2015   History of DVT (deep vein thrombosis) 02/25/2015   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   Constipation 12/12/2014   Coronary vasospasm (HCC) 12/09/2014   History of pulmonary embolus (PE) 09/19/2014   Anxiety and depression 04/13/2014   IBS (irritable bowel syndrome) 04/11/2014   GERD (gastroesophageal reflux disease) 04/11/2014   Insomnia 04/11/2014   Essential hypertension 10/28/2013   Anemia 10/28/2013   Hyperlipidemia 10/28/2013   PCP:  Glori Luis, MD Pharmacy:   [phone] - [place], [place] - [address] [place] [address] Phone: [phone] Fax: [phone]     Social Determinants of Health (SDOH) Social History: SDOH Screenings   Food Insecurity: No Food Insecurity (07/27/2023)  Recent Concern: Food Insecurity - Food Insecurity Present (07/27/2023)  Housing: Medium Risk (07/27/2023)  Transportation Needs: No Transportation Needs (07/27/2023)  Recent Concern: Transportation Needs - Unmet Transportation Needs (05/19/2023)  Utilities: Not At Risk (07/27/2023)  Alcohol Screen: Low Risk  (02/09/2023)  Depression (PHQ2-9): Low Risk  (06/19/2023)  Financial Resource Strain: High Risk (06/11/2023)  Physical Activity: Inactive (06/11/2023)  Social Connections:  Moderately Integrated (06/11/2023)  Stress: No Stress Concern Present (06/11/2023)  Tobacco Use: Medium Risk (07/27/2023)   SDOH Interventions:     Readmission Risk Interventions     No data to display

## 2023-07-28 NOTE — [place] FL2 (Signed)
[place] MEDICA[place]2 LEVEL OF CARE FORM     IDENTIFICATION  Patient Name: Valerie Vazquez Birthdate: 1956/06/16 Sex: female Admission Date (Current Location): 07/27/2023  [facility] and [place] Number:  [employer] and Address:  [facility] [place], [address] [place], [place] [address]      Provider Number: [id number]  Attending Physician Name and Address:  Donalee Citrin, MD  Relative Name and Phone Number:       Current Level of Care: Hospital Recommended Level of Care: Skilled Nursing Facility Prior Approval Number:    Date Approved/Denied:   PASRR Number: [id number]  Discharge Plan: SNF    Current Diagnoses: Patient Active Problem List   Diagnosis Date Noted   Spondylolisthesis at L4-L5 level 07/27/2023   Left leg numbness 05/20/2023   Thoracic back pain 03/13/2023   Obesity (BMI 30.0-34.9) 12/10/2022   Subcutaneous nodule 06/09/2022   Numbness of right foot 03/05/2022   OAB (overactive bladder) 03/05/2022   Ingrown fingernail 12/27/2021   Cramps, extremity 11/27/2021   Skin lesion of lower extremity- right knee  09/26/2021   Retention cyst of nasal sinus 08/27/2021   Rash 08/27/2021   Bulimia 01/14/2021   Cervical radiculitis 11/15/2020   Right arm pain 11/12/2020   Gross hematuria 09/14/2020   Bilateral low back pain without sciatica 06/20/2020   Paronychia of great toe of right foot 04/05/2020   Paronychia of great toe, right 02/27/2020   Foraminal stenosis of cervical region 01/31/2020   Cervical fusion syndrome (C5-C7 2014) 01/31/2020   Ingrown toenail 01/27/2020   Chronic diastolic heart failure (HCC) 01/27/2020   Postmenopausal estrogen deficiency 08/12/2019   Left wrist pain 05/20/2019   Allergic rhinitis 01/27/2019   Pain of right lower extremity 10/06/2018   Urinary incontinence 10/27/2017   Elevated alkaline phosphatase level 10/27/2017   Hypothyroidism 08/26/2017   Diabetes mellitus without complication (HCC)  05/29/2017   Right upper quadrant pain 01/12/2017   Plantar fasciitis of left foot 11/07/2016   CAD (coronary artery disease) 09/28/2016   Epigastric discomfort 09/16/2016   Schizoaffective disorder, depressive type (HCC) 08/28/2016   Cervicalgia 01/29/2016   Chronic low back pain (Location of Secondary source of pain) (Bilateral) (R>L) 01/29/2016   Chronic pain syndrome 01/29/2016   Primary osteoarthritis of right hip 01/29/2016   Lumbar central spinal stenosis (moderate at L4-5; mild at L2-3 and L3-4) 01/29/2016   Breast pain 10/19/2015   Abdominal pain 10/14/2015   Pleural effusion, bilateral 07/31/2015   Cough 07/31/2015   Esophageal spasm 06/05/2015   History of DVT (deep vein thrombosis) 02/25/2015   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   Constipation 12/12/2014   Coronary vasospasm (HCC) 12/09/2014   History of pulmonary embolus (PE) 09/19/2014   Anxiety and depression 04/13/2014   IBS (irritable bowel syndrome) 04/11/2014   GERD (gastroesophageal reflux disease) 04/11/2014   Insomnia 04/11/2014   Essential hypertension 10/28/2013   Anemia 10/28/2013   Hyperlipidemia 10/28/2013    Orientation RESPIRATION BLADDER Height & Weight     Self, Time, Situation, Place  Normal Incontinent Weight: 195 lb (88.5 kg) Height:  5\' 6"  (167.6 cm)  BEHAVIORAL SYMPTOMS/MOOD NEUROLOGICAL BOWEL NUTRITION STATUS      Continent Diet (please see discharge summary)  AMBULATORY STATUS COMMUNICATION OF NEEDS Skin   Limited Assist Verbally Surgical wounds (closed incision, Back)                       Personal Care Assistance Level of Assistance  Bathing, Feeding, Dressing Bathing Assistance: Limited assistance   Dressing Assistance: Limited assistance     Functional Limitations Info  Sight, Hearing, Speech Sight Info: Adequate Hearing Info: Adequate Speech Info: Adequate    SPECIAL CARE FACTORS FREQUENCY  PT (By licensed PT), OT (By licensed OT)     PT Frequency: 5x per week OT  Frequency: 5x per week            Contractures      Additional Factors Info  Code Status, Allergies Code Status Info: FULL             Current Medications (07/28/2023):  This is the current hospital active medication list Current Facility-Administered Medications  Medication Dose Route Frequency Provider Last Rate Last Admin   acetaminophen (TYLENOL) tablet 650 mg  650 mg Oral Q4H PRN Donalee Citrin, MD       Or   acetaminophen (TYLENOL) suppository 650 mg  650 mg Rectal Q4H PRN Donalee Citrin, MD       alum & mag hydroxide-simeth (MAALOX/MYLANTA) 200-200-20 MG/5ML suspension 30 mL  30 mL Oral Q6H PRN Donalee Citrin, MD       baclofen (LIORESAL) tablet 10 mg  10 mg Oral TID Donalee Citrin, MD   10 mg at 07/28/23 1617   cyanocobalamin (VITAMIN B12) tablet 1,000 mcg  1,000 mcg Oral Daily Donalee Citrin, MD   1,000 mcg at 07/28/23 1610   cyclobenzaprine (FLEXERIL) tablet 10 mg  10 mg Oral TID PRN Donalee Citrin, MD       dicyclomine (BENTYL) tablet 20 mg  20 mg Oral TID AC & HS Donalee Citrin, MD   20 mg at 07/28/23 1617   empagliflozin (JARDIANCE) tablet 25 mg  25 mg Oral Daily Donalee Citrin, MD   25 mg at 07/28/23 0819   escitalopram (LEXAPRO) tablet 20 mg  20 mg Oral Q24H Donalee Citrin, MD   20 mg at 07/28/23 0819   famotidine (PEPCID) tablet 20 mg  20 mg Oral QHS Donalee Citrin, MD   20 mg at 07/27/23 2136   ferrous sulfate tablet 325 mg  325 mg Oral Q breakfast Donalee Citrin, MD   325 mg at 07/28/23 9604   fluticasone (FLONASE) 50 MCG/ACT nasal spray 2 spray  2 spray Each Nare Daily Donalee Citrin, MD   2 spray at 07/28/23 5409   furosemide (LASIX) tablet 20 mg  20 mg Oral Daily Donalee Citrin, MD   20 mg at 07/28/23 8119   gabapentin (NEURONTIN) capsule 600 mg  600 mg Oral Q24H Donalee Citrin, MD   600 mg at 07/28/23 1478   gabapentin (NEURONTIN) capsule 900 mg  900 mg Oral Q24H Donalee Citrin, MD   900 mg at 07/27/23 2136   HYDROcodone-acetaminophen (NORCO/VICODIN) 5-325 MG per tablet 1 tablet  1 tablet Oral Q4H PRN Donalee Citrin, MD    1 tablet at 07/28/23 1342   HYDROmorphone (DILAUDID) injection 0.5 mg  0.5 mg Intravenous Q2H PRN Donalee Citrin, MD   0.5 mg at 07/27/23 1536   insulin aspart (novoLOG) injection 0-15 Units  0-15 Units Subcutaneous TID WC Donalee Citrin, MD   2 Units at 07/28/23 1203   isosorbide mononitrate (IMDUR) 24 hr tablet 15 mg  15 mg Oral Daily Donalee Citrin, MD   15 mg at 07/28/23 0820   levothyroxine (SYNTHROID) tablet 25 mcg  25 mcg Oral Q0600 Donalee Citrin, MD   25 mcg at 07/28/23 0524   magnesium oxide (MAG-OX) tablet 200 mg  200 mg  Oral Daily Donalee Citrin, MD   200 mg at 07/28/23 4098   menthol-cetylpyridinium (CEPACOL) lozenge 3 mg  1 lozenge Oral PRN Donalee Citrin, MD       Or   phenol (CHLORASEPTIC) mouth spray 1 spray  1 spray Mouth/Throat PRN Donalee Citrin, MD       multivitamin with minerals tablet 1 tablet  1 tablet Oral Daily Donalee Citrin, MD   1 tablet at 07/28/23 0820   nitroGLYCERIN (NITROSTAT) SL tablet 0.4 mg  0.4 mg Sublingual Q5 min PRN Donalee Citrin, MD       OLANZapine (ZYPREXA) tablet 10 mg  10 mg Oral QHS Donalee Citrin, MD   10 mg at 07/27/23 2137   ondansetron (ZOFRAN-ODT) disintegrating tablet 4 mg  4 mg Oral Q6H PRN Donalee Citrin, MD   4 mg at 07/28/23 0828   pantoprazole (PROTONIX) EC tablet 40 mg  40 mg Oral Daily Donalee Citrin, MD   40 mg at 07/28/23 1191   potassium chloride SA (KLOR-CON M) CR tablet 20 mEq  20 mEq Oral Daily PRN Donalee Citrin, MD       rosuvastatin (CRESTOR) tablet 40 mg  40 mg Oral QHS Donalee Citrin, MD   40 mg at 07/27/23 2136   sodium chloride flush (NS) 0.9 % injection 3 mL  3 mL Intravenous Q12H Donalee Citrin, MD   3 mL at 07/28/23 0830   sodium chloride flush (NS) 0.9 % injection 3 mL  3 mL Intravenous PRN Donalee Citrin, MD       sucralfate (CARAFATE) tablet 1 g  1 g Oral TID Joana Reamer, MD   1 g at 07/28/23 1617     Discharge Medications: Please see discharge summary for a list of discharge medications.  Relevant Imaging Results:  Relevant Lab Results:   Additional  Information Abilify ,Augmentin,Bactrim,Coconut,Diclofenac,Linaclotide,Morphine,Simvastatin,Sulfa Antibiotics,Wellbutrin,Zetia,Seroquel,Lisinopril,Indocin,Codeine  Eduard Roux, LCSW

## 2023-07-28 NOTE — Progress Notes (Addendum)
Patient reporting dizziness and fuzzy/almost black vision when getting up from using the toilet in her bathroom. Patient made it safely back to bed. Her BP when she returned to bed and sitting was 77/54. Patient believes she had some orthostatic hypotension back in 2016 during a previous hospital visit.  Dr. Wynetta Emery paged. Dr. Wynetta Emery returned page to order NS bolus, BMP, CBC, and to hold any BP meds for now. Orders placed.

## 2023-07-28 NOTE — Evaluation (Signed)
Physical Therapy Evaluation Patient Details Name: Valerie Vazquez MRN: [id number] DOB: Apr 15, 1956 Today's Date: 07/28/2023  History of Present Illness  67 y.o. female with history of diabetes, coronary vasospasm, DVT.  S/p Lumbar 3,4,5 PLIF.  Clinical Impression   Pt presents with generalized weakness, impaired balance, decreased knowledge and application of spinal precautions, impaired activity tolerance. Pt to benefit from acute PT to address deficits. Pt ambulated short hallway distance, requiring light assist for mobility overall during eval. Pt does not have sufficient support at d/c to assist with mobility and ADLs, Patient will benefit from continued inpatient follow up therapy, <3 hours/day. PT to progress mobility as tolerated, and will continue to follow acutely.          If plan is discharge home, recommend the following: A little help with walking and/or transfers;A little help with bathing/dressing/bathroom   Can travel by private vehicle        Equipment Recommendations None recommended by PT  Recommendations for Other Services       Functional Status Assessment Patient has had a recent decline in their functional status and demonstrates the ability to make significant improvements in function in a reasonable and predictable amount of time.     Precautions / Restrictions Precautions Precautions: Fall;Back Precaution Booklet Issued: Yes (comment) Precaution Comments: orders state no brace needed, but pt has LSO and wishes to wear while up and moving. PT reviewed BLT rules. hemovac Restrictions Weight Bearing Restrictions: No      Mobility  Bed Mobility Overal bed mobility: Needs Assistance Bed Mobility: Supine to Sit, Sit to Supine     Supine to sit: Min assist Sit to supine: Min assist   General bed mobility comments: cues for log roll technique, assist for trunk elevation and LE lift into bed    Transfers Overall transfer level: Needs  assistance Equipment used: Rolling walker (2 wheels) Transfers: Sit to/from Stand Sit to Stand: Min assist           General transfer comment: light rise and steady assist, cues for hand placement when rising and sitting    Ambulation/Gait Ambulation/Gait assistance: Contact guard assist Gait Distance (Feet): 80 Feet Assistive device: Rolling walker (2 wheels) Gait Pattern/deviations: Step-through pattern, Decreased stride length, Trunk flexed Gait velocity: decr     General Gait Details: cues for upright posture  Stairs            Wheelchair Mobility     Tilt Bed    Modified Rankin (Stroke Patients Only)       Balance Overall balance assessment: Needs assistance Sitting-balance support: Feet supported Sitting balance-Leahy Scale: Good     Standing balance support: Reliant on assistive device for balance, Bilateral upper extremity supported Standing balance-Leahy Scale: Poor Standing balance comment: reliant on external support                             Pertinent Vitals/Pain Pain Assessment Pain Assessment: Faces Faces Pain Scale: Hurts little more Pain Location: Incisional Pain Descriptors / Indicators: Discomfort, Grimacing, Guarding Pain Intervention(s): Limited activity within patient's tolerance, Monitored during session, Repositioned    Home Living Family/patient expects to be discharged to:: Skilled nursing facility Living Arrangements: Alone Available Help at Discharge: Friend(s);Available PRN/intermittently Type of Home: Apartment Home Access: Stairs to enter   Entrance Stairs-Number of Steps: 3 steps in the back   Home Layout: One level Home Equipment: Agricultural consultant (2 wheels);Rollator (4 wheels)  Prior Function Prior Level of Function : Independent/Modified Independent;Driving                     Extremity/Trunk Assessment   Upper Extremity Assessment Upper Extremity Assessment: Defer to OT evaluation     Lower Extremity Assessment Lower Extremity Assessment: Generalized weakness    Cervical / Trunk Assessment Cervical / Trunk Assessment: Back Surgery  Communication   Communication Communication: No apparent difficulties  Cognition Arousal: Alert Behavior During Therapy: WFL for tasks assessed/performed Overall Cognitive Status: Within Functional Limits for tasks assessed                                          General Comments      Exercises     Assessment/Plan    PT Assessment Patient needs continued PT services  PT Problem List Decreased strength;Decreased mobility;Decreased activity tolerance;Decreased balance;Decreased knowledge of use of DME;Pain;Decreased safety awareness;Decreased knowledge of precautions       PT Treatment Interventions DME instruction;Therapeutic activities;Gait training;Therapeutic exercise;Stair training;Balance training;Patient/family education;Functional mobility training    PT Goals (Current goals can be found in the Care Plan section)  Acute Rehab PT Goals Patient Stated Goal: go to rehab to get stronger, more indep PT Goal Formulation: With patient Time For Goal Achievement: 08/11/23 Potential to Achieve Goals: Good    Frequency Min 5X/week     Co-evaluation               AM-PAC PT "6 Clicks" Mobility  Outcome Measure Help needed turning from your back to your side while in a flat bed without using bedrails?: A Little Help needed moving from lying on your back to sitting on the side of a flat bed without using bedrails?: A Little Help needed moving to and from a bed to a chair (including a wheelchair)?: A Little Help needed standing up from a chair using your arms (e.g., wheelchair or bedside chair)?: A Little Help needed to walk in hospital room?: A Little Help needed climbing 3-5 steps with a railing? : A Lot 6 Click Score: 17    End of Session Equipment Utilized During Treatment: Back brace Activity  Tolerance: Patient tolerated treatment well Patient left: in bed;with call bell/phone within reach;with bed alarm set;with nursing/sitter in room Nurse Communication: Mobility status PT Visit Diagnosis: Other abnormalities of gait and mobility (R26.89);Muscle weakness (generalized) (M62.81)    Time: 4132-4401 PT Time Calculation (min) (ACUTE ONLY): 26 min   Charges:   PT Evaluation $PT Eval Low Complexity: 1 Low PT Treatments $Therapeutic Activity: 8-22 mins PT General Charges $$ ACUTE PT VISIT: 1 Visit         Marye Round, PT DPT Acute Rehabilitation Services Secure Chat Preferred  Office [phone]   Aram Domzalski E Christain [place] 07/28/2023, 12:06 PM

## 2023-07-29 LAB — GLUCOSE, CAPILLARY
Glucose-Capillary: 112 mg/dL — ABNORMAL HIGH (ref 70–99)
Glucose-Capillary: 125 mg/dL — ABNORMAL HIGH (ref 70–99)
Glucose-Capillary: 137 mg/dL — ABNORMAL HIGH (ref 70–99)
Glucose-Capillary: 106 mg/dL — ABNORMAL HIGH (ref 70–99)

## 2023-07-29 MED ORDER — TRAMADOL HCL 50 MG PO TABS
50.0000 mg | ORAL_TABLET | Freq: Four times a day (QID) | ORAL | Status: DC | PRN
  Administered 2023-07-29 – 2023-08-02 (×5): 50 mg via ORAL
  Filled 2023-07-29 (×6): qty 1

## 2023-07-29 MED FILL — Thrombin (Recombinant) For Soln 5000 Unit: CUTANEOUS | Qty: 2 | Status: AC

## 2023-07-29 NOTE — Plan of Care (Signed)

## 2023-07-29 NOTE — Progress Notes (Signed)
Physical Therapy Treatment Patient Details Name: Valerie Vazquez MRN: [id number] DOB: 02/28/1956 Today's Date: 07/29/2023   History of Present Illness 67 y.o. female with history of diabetes, coronary vasospasm, DVT.  S/p Lumbar 3,4,5 PLIF.    PT Comments  Pt slowly progressing toward goals, limited by pain and  guarded movement.  Emphasis on education, transition to EOB, donning the brace, sit to stand and progression of gait stability, speed and stamina.    If plan is discharge home, recommend the following: A little help with bathing/dressing/bathroom;Assistance with cooking/housework;Assist for transportation   Can travel by private vehicle     No  Equipment Recommendations  None recommended by PT    Recommendations for Other Services       Precautions / Restrictions Precautions Precautions: Fall;Back     Mobility  Bed Mobility Overal bed mobility: Needs Assistance Bed Mobility: Rolling, Sidelying to Sit, Sit to Sidelying Rolling: Min assist Sidelying to sit: Min assist, Mod assist     Sit to sidelying: Min assist General bed mobility comments: cues for technique, truncal assist up via R elbow    Transfers Overall transfer level: Needs assistance Equipment used: Rolling walker (2 wheels) Transfers: Sit to/from Stand Sit to Stand: Min assist           General transfer comment: cues for hand placement    Ambulation/Gait Ambulation/Gait assistance: Contact guard assist Gait Distance (Feet): 160 Feet Assistive device: Rolling walker (2 wheels) Gait Pattern/deviations: Step-through pattern   Gait velocity interpretation: <1.8 ft/sec, indicate of risk for recurrent falls   General Gait Details: slow, guarded, but generally steady gait with the RW.  cues for posture   Stairs             Wheelchair Mobility     Tilt Bed    Modified Rankin (Stroke Patients Only)       Balance     Sitting balance-Leahy Scale: Good Sitting balance -  Comments: EOB putting on LSO   Standing balance support: No upper extremity supported, During functional activity Standing balance-Leahy Scale: Poor Standing balance comment: reliant on RW for support                            Cognition Arousal: Alert Behavior During Therapy: WFL for tasks assessed/performed Overall Cognitive Status: Within Functional Limits for tasks assessed                                          Exercises      General Comments General comments (skin integrity, edema, etc.): Reinforced back precautions/care, log roll, donning brace/bracing issues, lifting restrictions and progression of activity post d/c from SNF      Pertinent Vitals/Pain Pain Assessment Pain Assessment: 0-10 Pain Score: 7  Pain Location: back, incision Pain Descriptors / Indicators: Discomfort, Grimacing, Guarding Pain Intervention(s): Monitored during session    Home Living Family/patient expects to be discharged to:: Skilled nursing facility Living Arrangements: Alone Available Help at Discharge: Friend(s);Available PRN/intermittently Type of Home: Apartment Home Access: Stairs to enter   Entrance Stairs-Number of Steps: 3 steps in the back   Home Layout: One level Home Equipment: Agricultural consultant (2 wheels);Rollator (4 wheels)      Prior Function            PT Goals (current goals can now be found in the  care plan section) Acute Rehab PT Goals PT Goal Formulation: With patient Time For Goal Achievement: 08/11/23 Potential to Achieve Goals: Good Progress towards PT goals: Progressing toward goals    Frequency    Min 5X/week      PT Plan      Co-evaluation              AM-PAC PT "6 Clicks" Mobility   Outcome Measure  Help needed turning from your back to your side while in a flat bed without using bedrails?: A Little Help needed moving from lying on your back to sitting on the side of a flat bed without using bedrails?: A  Little Help needed moving to and from a bed to a chair (including a wheelchair)?: A Little Help needed standing up from a chair using your arms (e.g., wheelchair or bedside chair)?: A Little Help needed to walk in hospital room?: A Little Help needed climbing 3-5 steps with a railing? : A Lot 6 Click Score: 17    End of Session Equipment Utilized During Treatment: Back brace Activity Tolerance: Patient tolerated treatment well Patient left: in bed;with call bell/phone within reach;with bed alarm set;with nursing/sitter in room Nurse Communication: Mobility status PT Visit Diagnosis: Other abnormalities of gait and mobility (R26.89);Pain Pain - part of body:  (back)     Time: 5643-3295 PT Time Calculation (min) (ACUTE ONLY): 39 min  Charges:    $Gait Training: 8-22 mins $Therapeutic Activity: 8-22 mins $Self Care/Home Management: 8-22 PT General Charges $$ ACUTE PT VISIT: 1 Visit                     07/29/2023  Jacinto Halim., PT Acute Rehabilitation Services [phone]  (office)   Eliseo Gum Chevonne Bostrom 07/29/2023, 6:39 PM

## 2023-07-29 NOTE — TOC Progression Note (Signed)
Transition of Care [facility]) - Progression Note    Patient Details  Name: Valerie Vazquez MRN: [id number] Date of Birth: 1955/11/03  [facility] CM/SW Contact  Eduard Roux, [place] Phone Number: 07/29/2023, 2:05 PM  Clinical Narrative:     CSW met with patient to inform [facility] made bed offer for rehab. Patient accepted bed offer.   Insurance authorization started reference # [id number].  TOC will continue to follow and assist with discharge planning.  Antony Blackbird, MSW, LCSW Clinical Social Worker    Expected Discharge Plan: Skilled Nursing Facility Barriers to Discharge: Continued Medical Work up  Expected Discharge Plan and Services In-house Referral: Clinical Social Work     Living arrangements for the past 2 months: Apartment                                       Social Determinants of Health (SDOH) Interventions SDOH Screenings   Food Insecurity: No Food Insecurity (07/27/2023)  Recent Concern: Food Insecurity - Food Insecurity Present (07/27/2023)  Housing: Medium Risk (07/27/2023)  Transportation Needs: No Transportation Needs (07/27/2023)  Recent Concern: Transportation Needs - Unmet Transportation Needs (05/19/2023)  Utilities: Not At Risk (07/27/2023)  Alcohol Screen: Low Risk  (02/09/2023)  Depression (PHQ2-9): Low Risk  (06/19/2023)  Financial Resource Strain: High Risk (06/11/2023)  Physical Activity: Inactive (06/11/2023)  Social Connections: Moderately Integrated (06/11/2023)  Stress: No Stress Concern Present (06/11/2023)  Tobacco Use: Medium Risk (07/27/2023)    Readmission Risk Interventions     No data to display

## 2023-07-29 NOTE — Progress Notes (Signed)
Occupational Therapy Treatment Patient Details Name: Valerie Vazquez MRN: [id number] DOB: [birth year] Today's Date: 07/29/2023   History of present illness 67 y.o. female with history of diabetes, coronary vasospasm, DVT.  S/p Lumbar 3,4,5 PLIF.   OT comments  Patient received in supine asking to use BSC. Patient instructed on log rolling and min assist to get to EOB. Patient instructed on hand placement and min assist to transfer to [facility] and patient able to perform toilet hygiene while standing. Patient transferred to recliner with complaints of dizziness and BP 75/79 (57). Patient's feet elevated and increased to 92/57 (67). Patient performed grooming seated in recliner and provided setup for breakfast with BP at end of session 96/60 (72). Patient will benefit from continued inpatient follow up therapy, <3 hours/day to continue to address bathing, dressing, and toilet transfers. Acute OT to continue to follow.       If plan is discharge home, recommend the following:  Assist for transportation;Assistance with cooking/housework;A little help with walking and/or transfers;A lot of help with bathing/dressing/bathroom   Equipment Recommendations  Tub/shower seat    Recommendations for Other Services      Precautions / Restrictions Precautions Precautions: Fall;Back Precaution Booklet Issued: Yes (comment) Precaution Comments: orders state no brace needed, but pt has LSO. OT reviewed BLT rules. hemovac Restrictions Weight Bearing Restrictions: No       Mobility Bed Mobility Overal bed mobility: Needs Assistance Bed Mobility: Sidelying to Sit, Rolling Rolling: Min assist Sidelying to sit: Min assist       General bed mobility comments: cues for log rolling and assistance with BLEs    Transfers Overall transfer level: Needs assistance Equipment used: Rolling walker (2 wheels) Transfers: Sit to/from Stand, Bed to chair/wheelchair/BSC Sit to Stand: Min assist     Step pivot  transfers: Contact guard assist, Min assist     General transfer comment: min assist to stand and CGA for transfer with cues for hand placement     Balance Overall balance assessment: Needs assistance Sitting-balance support: Feet supported Sitting balance-Leahy Scale: Good Sitting balance - Comments: EOB   Standing balance support: Single extremity supported, Bilateral upper extremity supported, During functional activity Standing balance-Leahy Scale: Poor Standing balance comment: reliant on RW for support                           ADL either performed or assessed with clinical judgement   ADL Overall ADL's : Needs assistance/impaired     Grooming: Wash/dry hands;Wash/dry face;Set up;Sitting           Upper Body Dressing : Contact guard assist;Standing Upper Body Dressing Details (indicate cue type and reason): donned gown to cover back     Toilet Transfer: Minimal assistance;BSC/3in1;Rolling walker (2 wheels) Toilet Transfer Details (indicate cue type and reason): cues for hand placement Toileting- Clothing Manipulation and Hygiene: Supervision/safety;Sit to/from stand         General ADL Comments: complaints of dizziness after toileting    Extremity/Trunk Assessment              Vision       Perception     Praxis      Cognition Arousal: Alert Behavior During Therapy: WFL for tasks assessed/performed Overall Cognitive Status: Within Functional Limits for tasks assessed  General Comments: pleasant, aware of back precautions        Exercises      Shoulder Instructions       General Comments BP after transfer from [facility] to recliner 75/49 (57), following 5 minutes in recliner with BLEs elevated 92/57 (67), at end of session 96/60 (72)    Pertinent Vitals/ Pain       Pain Assessment Pain Assessment: Faces Faces Pain Scale: Hurts little more Pain Location: back, incision Pain Descriptors  / Indicators: Discomfort, Grimacing, Guarding Pain Intervention(s): Limited activity within patient's tolerance, Monitored during session, Repositioned, Patient requesting pain meds-RN notified  Home Living                                          Prior Functioning/Environment              Frequency  Min 1X/week        Progress Toward Goals  OT Goals(current goals can now be found in the care plan section)  Progress towards OT goals: Progressing toward goals  Acute Rehab OT Goals Patient Stated Goal: get better OT Goal Formulation: With patient Time For Goal Achievement: 08/10/23 Potential to Achieve Goals: Good ADL Goals Pt Will Perform Grooming: with modified independence;standing Pt Will Perform Lower Body Dressing: with modified independence;sit to/from stand;with adaptive equipment Pt Will Transfer to Toilet: with modified independence;ambulating;regular height toilet  Plan      Co-evaluation                 AM-PAC OT "6 Clicks" Daily Activity     Outcome Measure   Help from another person eating meals?: None Help from another person taking care of personal grooming?: A Little Help from another person toileting, which includes using toliet, bedpan, or urinal?: A Little Help from another person bathing (including washing, rinsing, drying)?: A Lot Help from another person to put on and taking off regular upper body clothing?: A Little Help from another person to put on and taking off regular lower body clothing?: A Lot 6 Click Score: 17    End of Session Equipment Utilized During Treatment: Rolling walker (2 wheels)  OT Visit Diagnosis: Unsteadiness on feet (R26.81);Muscle weakness (generalized) (M62.81)   Activity Tolerance Patient tolerated treatment well   Patient Left in chair;with call bell/phone within reach;with chair alarm set   Nurse Communication Mobility status;Patient requests pain meds        Time: 0729-0755 OT  Time Calculation (min): 26 min  Charges: OT General Charges $OT Visit: 1 Visit OT Treatments $Self Care/Home Management : 23-37 mins  Alfonse Flavors, OTA Acute Rehabilitation Services  Office [phone]   Dewain Penning 07/29/2023, 9:00 AM

## 2023-07-29 NOTE — Progress Notes (Signed)
Subjective: Patient reports doing okay feels like the pain medication too strong and making her too groggy  Objective: Vital signs in last 24 hours: Temp:  [98.6 F (37 C)-99.1 F (37.3 C)] 99.1 F (37.3 C) (11/27 0806) Pulse Rate:  [71-96] 96 (11/27 0806) Resp:  [18-20] 18 (11/27 0806) BP: (77-136)/(54-81) 98/58 (11/27 0806) SpO2:  [90 %-93 %] 92 % (11/27 0806)  Intake/Output from previous day: 11/26 0701 - 11/27 0700 In: 2168.8 [P.O.:1200; IV Piggyback:968.8] Out: 175 [Drains:175] Intake/Output this shift: No intake/output data recorded.  Somnolent but arousable moves all extremities  Lab Results: Recent Labs    07/28/23 1534  WBC 12.0*  HGB 9.3*  HCT 28.4*  PLT 225   BMET Recent Labs    07/28/23 1534  NA 144  K 3.7  CL 106  CO2 30  GLUCOSE 113*  BUN 11  CREATININE 0.95  CALCIUM 8.8*    Studies/Results: DG Lumbar Spine 2-3 Views  Result Date: 07/27/2023 CLINICAL DATA:  Elective surgery. EXAM: LUMBAR SPINE - 2-3 VIEW COMPARISON:  Preoperative imaging. FINDINGS: Four fluoroscopic spot views of the lumbar spine obtained in the operating room. Pedicle screws at L3, L4, and L5 with interbody spacers. Fluoroscopy time 1 minutes 1 second. Dose 49.32 mGy. IMPRESSION: Intraoperative fluoroscopy during lumbar fusion. Electronically Signed   By: Narda Rutherford M.D.   On: 07/27/2023 12:02   DG C-Arm 1-60 Min-No Report  Result Date: 07/27/2023 Fluoroscopy was utilized by the requesting physician.  No radiographic interpretation.   DG C-Arm 1-60 Min-No Report  Result Date: 07/27/2023 Fluoroscopy was utilized by the requesting physician.  No radiographic interpretation.   DG C-Arm 1-60 Min-No Report  Result Date: 07/27/2023 Fluoroscopy was utilized by the requesting physician.  No radiographic interpretation.    Assessment/Plan: Postop day 3 from lumbar fusion making progress within his significant proved radicular pain still feeling dizzy when she gets out of  bed she feels that the pain medication might be too strong so we have discontinued the pain medication and started her on Ultram and we will see how she does with just that and/or Tylenol.  LOS: 2 days     Mariam Dollar 07/29/2023, 10:57 AM

## 2023-07-30 ENCOUNTER — Inpatient Hospital Stay (HOSPITAL_COMMUNITY): Payer: 59

## 2023-07-30 LAB — CBC WITH DIFFERENTIAL/PLATELET
Abs Immature Granulocytes: 0.04 10*3/uL (ref 0.00–0.07)
Basophils Absolute: 0.1 10*3/uL (ref 0.0–0.1)
Basophils Relative: 1 %
Eosinophils Absolute: 0.2 10*3/uL (ref 0.0–0.5)
Eosinophils Relative: 2 %
HCT: 30.6 % — ABNORMAL LOW (ref 36.0–46.0)
Hemoglobin: 9.9 g/dL — ABNORMAL LOW (ref 12.0–15.0)
Immature Granulocytes: 0 %
Lymphocytes Relative: 26 %
Lymphs Abs: 2.8 10*3/uL (ref 0.7–4.0)
MCH: 31.4 pg (ref 26.0–34.0)
MCHC: 32.4 g/dL (ref 30.0–36.0)
MCV: 97.1 fL (ref 80.0–100.0)
Monocytes Absolute: 0.8 10*3/uL (ref 0.1–1.0)
Monocytes Relative: 7 %
Neutro Abs: 6.7 10*3/uL (ref 1.7–7.7)
Neutrophils Relative %: 64 %
Platelets: 249 10*3/uL (ref 150–400)
RBC: 3.15 MIL/uL — ABNORMAL LOW (ref 3.87–5.11)
RDW: 13.4 % (ref 11.5–15.5)
WBC: 10.6 10*3/uL — ABNORMAL HIGH (ref 4.0–10.5)
nRBC: 0.2 % (ref 0.0–0.2)

## 2023-07-30 LAB — URINALYSIS, ROUTINE W REFLEX MICROSCOPIC
Bilirubin Urine: NEGATIVE
Glucose, UA: >=500 mg/dL — AB
Hgb urine dipstick: NEGATIVE
Ketones, ur: NEGATIVE mg/dL
Nitrite: NEGATIVE
Protein, ur: NEGATIVE mg/dL
Specific Gravity, Urine: 1.018 (ref 1.005–1.030)
WBC, UA: >50 WBC/hpf (ref 0–5)
pH: 7 (ref 5.0–8.0)

## 2023-07-30 LAB — GLUCOSE, CAPILLARY
Glucose-Capillary: 92 mg/dL (ref 70–99)
Glucose-Capillary: 148 mg/dL — ABNORMAL HIGH (ref 70–99)
Glucose-Capillary: 94 mg/dL (ref 70–99)
Glucose-Capillary: 125 mg/dL — ABNORMAL HIGH (ref 70–99)

## 2023-07-30 NOTE — Plan of Care (Signed)

## 2023-07-30 NOTE — Progress Notes (Signed)
She is mobilizing slowly.  She has appropriate back soreness.  She denies numbness or weakness in the legs.  Denies leg pain.

## 2023-07-30 NOTE — Progress Notes (Addendum)
Noted pt temp 100.7 orally, VS WDL. Pt with increased drowsiness responds to voice oriented x4 . Has had 2 incontinent episodes today previously continent. Tramadol given x 1 at 1150. Educated and instructed on IS. Leo Grosser, NP notified see new orders for CBC, chest xray, and urinalysis.

## 2023-07-30 NOTE — Progress Notes (Signed)
Physical Therapy Treatment Patient Details Name: Valerie Vazquez MRN: [id number] DOB: [birth year] Today's Date: 07/30/2023   History of Present Illness 67 y.o. female with history of diabetes, coronary vasospasm, DVT.  S/p Lumbar 3,4,5 PLIF.    PT Comments  Pt received in supine and agreeable to session. Pt requires cues for logroll technique and up to mod A to perform. Pt able to stand with min A, however reports increased dizziness. Pt positive for orthostatic hypotension (see below) with pt stating "everything is getting darker", so pt returned to supine. Pt continues to benefit from PT services to progress toward functional mobility goals.    Orthostatic BPs  Sitting 101/62  Standing 58/41  Supine 101/76      If plan is discharge home, recommend the following: A little help with bathing/dressing/bathroom;Assistance with cooking/housework;Assist for transportation   Can travel by private vehicle     No  Equipment Recommendations  None recommended by PT    Recommendations for Other Services       Precautions / Restrictions Precautions Precautions: Fall;Back Precaution Comments: hemovac Restrictions Weight Bearing Restrictions: No     Mobility  Bed Mobility Overal bed mobility: Needs Assistance Bed Mobility: Rolling, Sidelying to Sit, Sit to Sidelying   Sidelying to sit: Mod assist, Used rails     Sit to sidelying: Min assist General bed mobility comments: Mod A for trunk elevation and cues for logroll technique. min A for BLE elevation back to EOB    Transfers Overall transfer level: Needs assistance Equipment used: Rolling walker (2 wheels) Transfers: Sit to/from Stand Sit to Stand: Min assist           General transfer comment: cues for hand placement and min A for power up    Ambulation/Gait               General Gait Details: deferred due to low BP       Balance Overall balance assessment: Needs assistance Sitting-balance support: Feet  supported Sitting balance-Leahy Scale: Good Sitting balance - Comments: sitting EOB   Standing balance support: No upper extremity supported, During functional activity Standing balance-Leahy Scale: Poor Standing balance comment: reliant on RW for support                            Cognition Arousal: Alert Behavior During Therapy: WFL for tasks assessed/performed Overall Cognitive Status: Within Functional Limits for tasks assessed                                          Exercises      General Comments General comments (skin integrity, edema, etc.): Pt with symptomatic orthostatic hypotension. BP sitting 101/62; standing: 58/41; supine: 101/76      Pertinent Vitals/Pain Pain Assessment Pain Assessment: Faces Faces Pain Scale: Hurts little more Pain Location: back, incision Pain Descriptors / Indicators: Discomfort, Grimacing, Guarding Pain Intervention(s): Monitored during session     PT Goals (current goals can now be found in the care plan section) Acute Rehab PT Goals Patient Stated Goal: go to rehab to get stronger, more indep PT Goal Formulation: With patient Time For Goal Achievement: 08/11/23 Progress towards PT goals: Progressing toward goals    Frequency    Min 5X/week       AM-PAC PT "6 Clicks" Mobility   Outcome Measure  Help needed turning  from your back to your side while in a flat bed without using bedrails?: A Little Help needed moving from lying on your back to sitting on the side of a flat bed without using bedrails?: A Lot Help needed moving to and from a bed to a chair (including a wheelchair)?: A Little Help needed standing up from a chair using your arms (e.g., wheelchair or bedside chair)?: A Little Help needed to walk in hospital room?: A Little Help needed climbing 3-5 steps with a railing? : A Lot 6 Click Score: 16    End of Session Equipment Utilized During Treatment: Gait belt Activity Tolerance:  Other (comment) (limited by low BP) Patient left: in bed;with call bell/phone within reach;with bed alarm set Nurse Communication: Mobility status PT Visit Diagnosis: Other abnormalities of gait and mobility (R26.89);Pain     Time: 0827-0850 PT Time Calculation (min) (ACUTE ONLY): 23 min  Charges:    $Therapeutic Activity: 8-22 mins PT General Charges $$ ACUTE PT VISIT: 1 Visit                     Johny Shock, PTA Acute Rehabilitation Services Secure Chat Preferred  [phone]    Johny Shock 07/30/2023, 9:00 AM

## 2023-07-31 DIAGNOSIS — N3000 Acute cystitis without hematuria: Secondary | ICD-10-CM

## 2023-07-31 LAB — GLUCOSE, CAPILLARY
Glucose-Capillary: 100 mg/dL — ABNORMAL HIGH (ref 70–99)
Glucose-Capillary: 196 mg/dL — ABNORMAL HIGH (ref 70–99)
Glucose-Capillary: 102 mg/dL — ABNORMAL HIGH (ref 70–99)
Glucose-Capillary: 125 mg/dL — ABNORMAL HIGH (ref 70–99)

## 2023-07-31 MED ORDER — NITROFURANTOIN MONOHYD MACRO 100 MG PO CAPS
100.0000 mg | ORAL_CAPSULE | Freq: Two times a day (BID) | ORAL | Status: DC
  Administered 2023-07-31 – 2023-08-01 (×3): 100 mg via ORAL
  Filled 2023-07-31 (×3): qty 1

## 2023-07-31 NOTE — Progress Notes (Addendum)
Subjective: Patient reports doing ok  Objective: Vital signs in last 24 hours: Temp:  [98.4 F (36.9 C)-100.9 F (38.3 C)] 100.1 F (37.8 C) (11/29 0310) Pulse Rate:  [76-84] 76 (11/29 0310) Resp:  [18] 18 (11/28 2306) BP: (109-122)/(63-65) 115/63 (11/29 0310) SpO2:  [91 %] 91 % (11/29 0310)  Intake/Output from previous day: 11/28 0701 - 11/29 0700 In: 120 [P.O.:120] Out: 360 [Urine:300; Drains:60] Intake/Output this shift: No intake/output data recorded.  Neurologic: Grossly normal  Lab Results: Lab Results  Component Value Date   WBC 10.6 (H) 07/30/2023   HGB 9.9 (L) 07/30/2023   HCT 30.6 (L) 07/30/2023   MCV 97.1 07/30/2023   PLT 249 07/30/2023   Lab Results  Component Value Date   INR 1.2 06/01/2020   BMET Lab Results  Component Value Date   NA 144 07/28/2023   K 3.7 07/28/2023   CL 106 07/28/2023   CO2 30 07/28/2023   GLUCOSE 113 (H) 07/28/2023   BUN 11 07/28/2023   CREATININE 0.95 07/28/2023   CALCIUM 8.8 (L) 07/28/2023    Studies/Results: DG Chest 1 View  Result Date: 07/30/2023 CLINICAL DATA:  Fever.  Increased drowsiness since this morning EXAM: CHEST  1 VIEW COMPARISON:  Chest x-ray 02/18/2023 and older FINDINGS: Hyperinflation. Stable cardiopericardial silhouette. No pneumothorax or edema. Slight blunting of the inferior costophrenic angles bilaterally. Tiny effusions are possible. No consolidation. Degenerative changes of the spine. Fixation hardware along the lower cervical spine. IMPRESSION: Hyperinflation.  Question tiny pleural effusions. Electronically Signed   By: Karen Kays M.D.   On: 07/30/2023 18:43    Assessment/Plan: S/p lumbar fusion. Patient started running a fever yesteryda, UA shows UTI and chest xray possible pleural effusion. Consulted hospitalist to help with management of this. Encouraged IS when awake. Hemovac drain removed. Per nurse she got orthostatic yesterday as well.    LOS: 4 days    Tiana Loft  [facility] 07/31/2023, 7:40 AM

## 2023-07-31 NOTE — Hospital Course (Signed)
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  She had a decompressive laminectomy at interbody fusion on 11/25.  She has had leukocytosis since surgery and fever up to 100.9 yesterday.  UA today concerning for an infection. TRH consulted for management of possible UTI.

## 2023-07-31 NOTE — Consult Note (Addendum)
Consultation Progress Note   Patient: Valerie Vazquez QIO:[id number] DOB: 07-Nov-1955 DOA: 07/27/2023 DOS: the patient was seen and examined on 07/31/2023 Primary service: Donalee Citrin, MD  Brief hospital course: 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  She had a decompressive laminectomy at interbody fusion on 11/25.  She has had leukocytosis since surgery and fever up to 100.9 yesterday.  UA today concerning for an infection. TRH consulted for management of possible UTI.  Assessment and Plan:  # UTI Patient status post spinal fusion on postop day 4 found to have a fever of 100.9 yesterday with leukocytosis. UA shows evidence of infection. Patient denies denies dysuria but reports frequent urination. She has been febrile with leukocytosis. She is also on SGLT2 inhibitor which increases her risk of urinary tract infections. -Macrobid 100 mg BID for 5 days -Follow-up urine culture -Trend fever curve, CBC -Will need to discuss with her PCP about discontinuing her Jardiance if she has repeated UTIs  # Lumbar fusion -Per primary team   TRH will continue to follow the patient.  Subjective:  Patient evaluated at bedside eating her breakfast.  She looks comfortable.  States her pain is better. She denies any dysuria, or hematuria but reports increased urinary frequency. She does report a history of UTIs but states this was many years ago.  Physical Exam: Vitals:   07/30/23 2306 07/31/23 0310 07/31/23 0742 07/31/23 0809  BP: 122/64 115/63 131/71 123/68  Pulse: 79 76 82   Resp: 18  16   Temp: 99.1 F (37.3 C) 100.1 F (37.8 C) 98.7 F (37.1 C)   TempSrc: Oral  Oral   SpO2: 91% 91% 92%   Weight:      Height:       General: Pleasant, well-appearing elderly woman laying  in bed. No acute distress. HEENT: [place]/AT. Anicteric sclera CV: RRR. No murmurs, rubs, or gallops. No LE edema Pulmonary: Lungs CTAB. Normal effort. No wheezing or rales. Abdominal: Soft, nontender, nondistended. Normal bowel sounds. Extremities: Palpable radial and DP pulses. Normal ROM. Skin: Warm and dry. No obvious rash or lesions. Neuro: A&Ox3. Moves all extremities. Normal sensation to light touch. No focal deficit. Psych: Normal mood and affect  Data Reviewed:  CBC showed WBC 10.6, Hgb 9.9, platelet 249 UA shows significant glucosuria, negative nitrite, large leuks, RBC 6/10, WBC >50 and rare bacteria. CXR with mild pleural effusions but no acute cardiopulmonary disease  Family Communication: No family at bedside  Time spent: 45 minutes.  Author: Steffanie Rainwater, MD 07/31/2023 8:30 AM  For on call review [email or web address].

## 2023-07-31 NOTE — TOC Progression Note (Addendum)
Transition of Care [facility]) - Progression Note    Patient Details  Name: Valerie Vazquez MRN: [id number] Date of Birth: 1955/11/20  Transition of [facility]) CM/SW Contact  Dellie Burns [place], [place] Phone Number: 07/31/2023, 12:39 PM  Clinical Narrative: home and community/[employer] auth received for [facility] Pike #[id number] valid 11/27-12/2. Bedside RN updated and will notify provider. Await confirmation from [facility] re ability to accept pt over weekend if not ready for dc today.   UPDATE 1340: spoke to [place] at [facility] who reports they are not able to accept pt over weekend but can admit Monday 12/2 pending medical clearance. If pt not ready for dc Monday, will need to start new auth. Bedside RN updated.   Dellie Burns, MSW, LCSW [phone] (coverage)      Expected Discharge Plan: Skilled Nursing Facility Barriers to Discharge: Continued Medical Work up  Expected Discharge Plan and Services In-house Referral: Clinical Social Work     Living arrangements for the past 2 months: Apartment                                       Social Determinants of Health (SDOH) Interventions SDOH Screenings   Food Insecurity: No Food Insecurity (07/27/2023)  Recent Concern: Food Insecurity - Food Insecurity Present (07/27/2023)  Housing: Medium Risk (07/27/2023)  Transportation Needs: No Transportation Needs (07/27/2023)  Recent Concern: Transportation Needs - Unmet Transportation Needs (05/19/2023)  Utilities: Not At Risk (07/27/2023)  Alcohol Screen: Low Risk  (02/09/2023)  Depression (PHQ2-9): Low Risk  (06/19/2023)  Financial Resource Strain: High Risk (06/11/2023)  Physical Activity: Inactive (06/11/2023)  Social Connections: Moderately Integrated (06/11/2023)  Stress: No Stress Concern Present (06/11/2023)  Tobacco Use: Medium Risk (07/27/2023)    Readmission Risk Interventions     No data to display

## 2023-07-31 NOTE — Progress Notes (Signed)
Physical Therapy Treatment Patient Details Name: Valerie Vazquez MRN: [id number] DOB: 1956/03/29 Today's Date: 07/31/2023   History of Present Illness 67 y.o. female with history of diabetes, coronary vasospasm, DVT.  S/p Lumbar 3,4,5 PLIF.    PT Comments  Pt is progressing steadily toward goals, but doesn't assimilate new information and safety direction well, needing repetitive cuing.  Emphasis on general safety/prec, rolling and transition technique, donning her brace, sit to stands and progression of gait stability and stamina.      If plan is discharge home, recommend the following: A little help with bathing/dressing/bathroom;Assistance with cooking/housework;Assist for transportation   Can travel by private vehicle     No  Equipment Recommendations  None recommended by PT    Recommendations for Other Services       Precautions / Restrictions Precautions Precautions: Fall;Back Precaution Booklet Issued: Yes (comment)     Mobility  Bed Mobility   Bed Mobility: Rolling, Sidelying to Sit, Sit to Sidelying Rolling: Contact guard assist Sidelying to sit: Min assist, Used rails Supine to sit: Min assist   Sit to sidelying: Min assist General bed mobility comments: cues for technique and assist throughout egress and ingress.    Transfers Overall transfer level: Needs assistance Equipment used: Rolling walker (2 wheels) Transfers: Sit to/from Stand Sit to Stand: Contact guard assist, Min assist           General transfer comment: cues for hand placement, CG to min assist based on height and surface    Ambulation/Gait Ambulation/Gait assistance: Contact guard assist Gait Distance (Feet): 40 Feet (x2, limited by light-headedness and shakiness) Assistive device: Rolling walker (2 wheels) Gait Pattern/deviations: Step-through pattern   Gait velocity interpretation: <1.8 ft/sec, indicate of risk for recurrent falls   General Gait Details: limited due to  lightheadedness   Stairs             Wheelchair Mobility     Tilt Bed    Modified Rankin (Stroke Patients Only)       Balance     Sitting balance-Leahy Scale: Good       Standing balance-Leahy Scale: Poor Standing balance comment: reliant on RW for support                            Cognition Arousal: Alert Behavior During Therapy: WFL for tasks assessed/performed Overall Cognitive Status: Within Functional Limits for tasks assessed                                          Exercises      General Comments General comments (skin integrity, edema, etc.): reinforced back prec, donning brace, progression of activity.      Pertinent Vitals/Pain Pain Assessment Pain Assessment: Faces Faces Pain Scale: Hurts little more Pain Location: back, incision Pain Descriptors / Indicators: Discomfort, Grimacing, Guarding Pain Intervention(s): Monitored during session    Home Living Family/patient expects to be discharged to:: Skilled nursing facility                        Prior Function            PT Goals (current goals can now be found in the care plan section) Acute Rehab PT Goals PT Goal Formulation: With patient Time For Goal Achievement: 08/11/23 Potential to Achieve Goals: Good Progress  towards PT goals: Progressing toward goals    Frequency    Min 5X/week      PT Plan      Co-evaluation              AM-PAC PT "6 Clicks" Mobility   Outcome Measure  Help needed turning from your back to your side while in a flat bed without using bedrails?: A Little Help needed moving from lying on your back to sitting on the side of a flat bed without using bedrails?: A Lot Help needed moving to and from a bed to a chair (including a wheelchair)?: A Little Help needed standing up from a chair using your arms (e.g., wheelchair or bedside chair)?: A Little Help needed to walk in hospital room?: A Little Help needed  climbing 3-5 steps with a railing? : A Lot 6 Click Score: 16    End of Session   Activity Tolerance: Other (comment) (limited by light headedness) Patient left: in bed;with call bell/phone within reach;with bed alarm set Nurse Communication: Mobility status PT Visit Diagnosis: Other abnormalities of gait and mobility (R26.89);Pain Pain - part of body:  (back)     Time: 0960-4540 PT Time Calculation (min) (ACUTE ONLY): 37 min  Charges:    $Gait Training: 8-22 mins $Therapeutic Activity: 8-22 mins PT General Charges $$ ACUTE PT VISIT: 1 Visit                     07/31/2023  Jacinto Halim., PT Acute Rehabilitation Services [phone]  (office)   Eliseo Gum Kera Deacon 07/31/2023, 5:16 PM

## 2023-08-01 DIAGNOSIS — N3000 Acute cystitis without hematuria: Secondary | ICD-10-CM

## 2023-08-01 DIAGNOSIS — E039 Hypothyroidism, unspecified: Secondary | ICD-10-CM | POA: Diagnosis not present

## 2023-08-01 DIAGNOSIS — E119 Type 2 diabetes mellitus without complications: Secondary | ICD-10-CM | POA: Diagnosis not present

## 2023-08-01 DIAGNOSIS — M4316 Spondylolisthesis, lumbar region: Secondary | ICD-10-CM

## 2023-08-01 LAB — GLUCOSE, CAPILLARY
Glucose-Capillary: 100 mg/dL — ABNORMAL HIGH (ref 70–99)
Glucose-Capillary: 163 mg/dL — ABNORMAL HIGH (ref 70–99)
Glucose-Capillary: 150 mg/dL — ABNORMAL HIGH (ref 70–99)
Glucose-Capillary: 102 mg/dL — ABNORMAL HIGH (ref 70–99)

## 2023-08-01 LAB — BASIC METABOLIC PANEL
Anion gap: 7 (ref 5–15)
BUN: 13 mg/dL (ref 8–23)
CO2: 28 mmol/L (ref 22–32)
Calcium: 8.7 mg/dL — ABNORMAL LOW (ref 8.9–10.3)
Chloride: 102 mmol/L (ref 98–111)
Creatinine, Ser: 0.79 mg/dL (ref 0.44–1.00)
GFR, Estimated: >60 mL/min (ref >60)
Glucose, Bld: 138 mg/dL — ABNORMAL HIGH (ref 70–99)
Potassium: 4 mmol/L (ref 3.5–5.1)
Sodium: 137 mmol/L (ref 135–145)

## 2023-08-01 LAB — CBC
HCT: 31 % — ABNORMAL LOW (ref 36.0–46.0)
Hemoglobin: 10.1 g/dL — ABNORMAL LOW (ref 12.0–15.0)
MCH: 31.5 pg (ref 26.0–34.0)
MCHC: 32.6 g/dL (ref 30.0–36.0)
MCV: 96.6 fL (ref 80.0–100.0)
Platelets: 309 10*3/uL (ref 150–400)
RBC: 3.21 MIL/uL — ABNORMAL LOW (ref 3.87–5.11)
RDW: 13 % (ref 11.5–15.5)
WBC: 10 10*3/uL (ref 4.0–10.5)
nRBC: 0 % (ref 0.0–0.2)

## 2023-08-01 MED ORDER — POLYETHYLENE GLYCOL 3350 17 G PO PACK
17.0000 g | PACK | Freq: Every day | ORAL | Status: DC
  Filled 2023-08-01: qty 1

## 2023-08-01 MED ORDER — BISACODYL 5 MG PO TBEC
10.0000 mg | DELAYED_RELEASE_TABLET | Freq: Every day | ORAL | Status: DC | PRN
  Administered 2023-08-01 – 2023-08-02 (×2): 10 mg via ORAL
  Filled 2023-08-01 (×2): qty 2

## 2023-08-01 MED ORDER — POLYETHYLENE GLYCOL 3350 17 G PO PACK: 17.0000 g | PACK | Freq: Every day | ORAL | Status: DC | PRN

## 2023-08-01 MED ORDER — DOCUSATE SODIUM 100 MG PO CAPS
100.0000 mg | ORAL_CAPSULE | Freq: Two times a day (BID) | ORAL | Status: DC
  Administered 2023-08-01 – 2023-08-03 (×4): 100 mg via ORAL
  Filled 2023-08-01 (×5): qty 1

## 2023-08-01 MED ORDER — SODIUM CHLORIDE 0.9 % IV SOLN
1.0000 g | INTRAVENOUS | Status: AC
  Administered 2023-08-01 – 2023-08-03 (×3): 1 g via INTRAVENOUS
  Filled 2023-08-01 (×3): qty 10

## 2023-08-01 NOTE — Progress Notes (Signed)
Physical Therapy Treatment Patient Details Name: Valerie Vazquez MRN: [id number] DOB: May 13, 1956 Today's Date: 08/01/2023   History of Present Illness 67 y.o. female with history of diabetes, coronary vasospasm, DVT.  S/p Lumbar 3,4,5 PLIF.    PT Comments  Pt is progressing well toward goals, made better by clearing mentation.  Emphasis on back education and progression of activity post d/c from last venue, sit to stand, transfers and progression of gait stability/stamina.     If plan is discharge home, recommend the following: A little help with bathing/dressing/bathroom;Assistance with cooking/housework;Assist for transportation   Can travel by private vehicle     No  Equipment Recommendations  None recommended by PT    Recommendations for Other Services       Precautions / Restrictions Precautions Precautions: Fall;Back     Mobility  Bed Mobility               General bed mobility comments: OOB on BSC on arrival    Transfers Overall transfer level: Needs assistance Equipment used: Rolling walker (2 wheels) Transfers: Sit to/from Stand Sit to Stand: Contact guard assist   Step pivot transfers: Contact guard assist       General transfer comment: cues for hand placement/safety    Ambulation/Gait Ambulation/Gait assistance: Contact guard assist Gait Distance (Feet): 170 Feet Assistive device: Rolling walker (2 wheels) Gait Pattern/deviations: Step-through pattern Gait velocity: decr Gait velocity interpretation: <1.8 ft/sec, indicate of risk for recurrent falls   General Gait Details: slow and steady gait with appropriate use of the RW, no signs of dizziness/BP issues.  Still needs cues for hand placement.   Stairs             Wheelchair Mobility     Tilt Bed    Modified Rankin (Stroke Patients Only)       Balance Overall balance assessment: Needs assistance Sitting-balance support: Feet supported Sitting balance-Leahy Scale:  Good Sitting balance - Comments: sitting EOB   Standing balance support: No upper extremity supported, During functional activity, Single extremity supported Standing balance-Leahy Scale: Fair Standing balance comment: generally reliant on RW during dynamic tasks                            Cognition Arousal: Alert Behavior During Therapy: WFL for tasks assessed/performed Overall Cognitive Status: Within Functional Limits for tasks assessed                                          Exercises      General Comments General comments (skin integrity, edema, etc.): reinforced back care/prec, donning brace, lifting restrictions and progression of gait/activity post d/c      Pertinent Vitals/Pain Pain Assessment Pain Assessment: Faces Faces Pain Scale: Hurts little more Pain Location: back, incision Pain Descriptors / Indicators: Discomfort Pain Intervention(s): Limited activity within patient's tolerance, Monitored during session    Home Living                          Prior Function            PT Goals (current goals can now be found in the care plan section) Acute Rehab PT Goals Patient Stated Goal: go to rehab to get stronger, more indep PT Goal Formulation: With patient Time For Goal Achievement: 08/11/23 Potential to  Achieve Goals: Good Progress towards PT goals: Progressing toward goals    Frequency    Min 5X/week      PT Plan      Co-evaluation              AM-PAC PT "6 Clicks" Mobility   Outcome Measure  Help needed turning from your back to your side while in a flat bed without using bedrails?: A Little Help needed moving from lying on your back to sitting on the side of a flat bed without using bedrails?: A Little Help needed moving to and from a bed to a chair (including a wheelchair)?: A Little Help needed standing up from a chair using your arms (e.g., wheelchair or bedside chair)?: A Little Help needed  to walk in hospital room?: A Little Help needed climbing 3-5 steps with a railing? : A Lot 6 Click Score: 17    End of Session   Activity Tolerance: Patient tolerated treatment well Patient left: in chair;with call bell/phone within reach Nurse Communication: Mobility status PT Visit Diagnosis: Other abnormalities of gait and mobility (R26.89);Pain Pain - part of body:  (back)     Time: 1255-1316 PT Time Calculation (min) (ACUTE ONLY): 21 min  Charges:    $Gait Training: 8-22 mins PT General Charges $$ ACUTE PT VISIT: 1 Visit                     08/01/2023  Jacinto Halim., PT Acute Rehabilitation Services [phone]  (office)   Eliseo Gum Dartha Rozzell 08/01/2023, 1:42 PM

## 2023-08-01 NOTE — Progress Notes (Signed)
PROGRESS NOTE    GEISHA BOGENSCHUTZ  UJW:[id number] DOB: [birth year] DOA: 07/27/2023 PCP: Glori Luis, MD   Brief Narrative:  67 year old with history of DDD, HLD, hypothyroidism, GERD underwent decompressive laminectomies and lumbar fusion by neurosurgery on 07/27/2023.  [facility] was consulted on 07/31/2023 for fever with possible UTI.  Assessment & Plan:   UTI -Urine culture pending.  Currently on Macrobid.  Will switch to Rocephin and await urine culture. -Temperature max of 100 over the last 24 hours  Hypertension hyperlipidemia -Continue Lasix, Imdur and statin.  Blood pressure currently stable  Leukocytosis -Resolved  Anemia of chronic disease -Continue Lasix, Imdur and statin.  Blood pressure currently stable  Diabetes mellitus type 2 -Blood sugars stable.  Continue Jardiance and CBGs with SSI.  Carb modified diet.  GERD -Continue Protonix and sucralfate   Subjective: Patient seen and examined at bedside.  Had fever last night.  No worsening abdominal pain, shortness of breath, vomiting reported.  Objective: Vitals:   07/31/23 1947 07/31/23 2324 08/01/23 0253 08/01/23 0727  BP: 110/60 111/67 138/73 109/68  Pulse: 92  79   Resp:    18  Temp: 100 F (37.8 C) 98.9 F (37.2 C) 98.3 F (36.8 C) 98.4 F (36.9 C)  TempSrc: Oral Oral Oral Oral  SpO2: 91% 90% 93% 94%  Weight:      Height:       No intake or output data in the 24 hours ending 08/01/23 1107 Filed Weights   07/27/23 0554  Weight: 88.5 kg    Examination:  General exam: Appears calm and comfortable.  On room air. Respiratory system: Bilateral decreased breath sounds at bases Cardiovascular system: S1 & S2 heard, Rate controlled Gastrointestinal system: Abdomen is nondistended, soft and nontender. Normal bowel sounds heard. Extremities: No cyanosis, clubbing, edema  Central nervous system: Alert and oriented. No focal neurological deficits. Moving extremities Skin: No rashes, lesions or  ulcers Psychiatry: Flat affect.  Not agitated.   Data Reviewed: I have personally reviewed following labs and imaging studies  CBC: Recent Labs  Lab 07/28/23 1534 07/30/23 1715 08/01/23 0359  WBC 12.0* 10.6* 10.0  NEUTROABS 8.6* 6.7  --   HGB 9.3* 9.9* 10.1*  HCT 28.4* 30.6* 31.0*  MCV 96.9 97.1 96.6  PLT 225 249 309   Basic Metabolic Panel: Recent Labs  Lab 07/28/23 1534 08/01/23 0359  NA 144 137  K 3.7 4.0  CL 106 102  CO2 30 28  GLUCOSE 113* 138*  BUN 11 13  CREATININE 0.95 0.79  CALCIUM 8.8* 8.7*   GFR: Estimated Creatinine Clearance: 74.4 mL/min (by C-G formula based on SCr of 0.79 mg/dL). Liver Function Tests: No results for input(s): "AST", "ALT", "ALKPHOS", "BILITOT", "PROT", "ALBUMIN" in the last 168 hours. No results for input(s): "LIPASE", "AMYLASE" in the last 168 hours. No results for input(s): "AMMONIA" in the last 168 hours. Coagulation Profile: No results for input(s): "INR", "PROTIME" in the last 168 hours. Cardiac Enzymes: No results for input(s): "CKTOTAL", "CKMB", "CKMBINDEX", "TROPONINI" in the last 168 hours. BNP (last 3 results) No results for input(s): "PROBNP" in the last 8760 hours. HbA1C: No results for input(s): "HGBA1C" in the last 72 hours. CBG: Recent Labs  Lab 07/31/23 0741 07/31/23 1128 07/31/23 1548 07/31/23 2113 08/01/23 0727  GLUCAP 100* 196* 102* 125* 100*   Lipid Profile: No results for input(s): "CHOL", "HDL", "LDLCALC", "TRIG", "CHOLHDL", "LDLDIRECT" in the last 72 hours. Thyroid Function Tests: No results for input(s): "TSH", "T4TOTAL", "FREET4", "T3FREE", "THYROIDAB"  in the last 72 hours. Anemia Panel: No results for input(s): "VITAMINB12", "FOLATE", "FERRITIN", "TIBC", "IRON", "RETICCTPCT" in the last 72 hours. Sepsis Labs: No results for input(s): "PROCALCITON", "LATICACIDVEN" in the last 168 hours.  Recent Results (from the past 240 hour(s))  Surgical pcr screen     Status: None   Collection Time:  07/22/23 11:53 AM   Specimen: Nasal Mucosa; Nasal Swab  Result Value Ref Range Status   MRSA, PCR NEGATIVE NEGATIVE Final   Staphylococcus aureus NEGATIVE NEGATIVE Final    Comment: (NOTE) The Xpert SA Assay (FDA approved for NASAL specimens in patients 68 years of age and older), is one component of a comprehensive surveillance program. It is not intended to diagnose infection nor to guide or monitor treatment. Performed at [facility], [address], [place], [place] [address]          Radiology Studies: DG Chest 1 View  Result Date: 07/30/2023 CLINICAL DATA:  Fever.  Increased drowsiness since this morning EXAM: CHEST  1 VIEW COMPARISON:  Chest x-ray 02/18/2023 and older FINDINGS: Hyperinflation. Stable cardiopericardial silhouette. No pneumothorax or edema. Slight blunting of the inferior costophrenic angles bilaterally. Tiny effusions are possible. No consolidation. Degenerative changes of the spine. Fixation hardware along the lower cervical spine. IMPRESSION: Hyperinflation.  Question tiny pleural effusions. Electronically Signed   By: Karen Kays M.D.   On: 07/30/2023 18:43        Scheduled Meds:  baclofen  10 mg Oral TID   cyanocobalamin  1,000 mcg Oral Daily   dicyclomine  20 mg Oral TID AC & HS   empagliflozin  25 mg Oral Daily   escitalopram  20 mg Oral Q24H   famotidine  20 mg Oral QHS   ferrous sulfate  325 mg Oral Q breakfast   fluticasone  2 spray Each Nare Daily   furosemide  20 mg Oral Daily   gabapentin  600 mg Oral Q24H   gabapentin  900 mg Oral Q24H   insulin aspart  0-15 Units Subcutaneous TID WC   isosorbide mononitrate  15 mg Oral Daily   levothyroxine  25 mcg Oral Q0600   magnesium oxide  200 mg Oral Daily   multivitamin with minerals  1 tablet Oral Daily   nitrofurantoin (macrocrystal-monohydrate)  100 mg Oral Q12H   OLANZapine  10 mg Oral QHS   pantoprazole  40 mg Oral Daily   rosuvastatin  40 mg Oral QHS   sodium chloride flush  3  mL Intravenous Q12H   sucralfate  1 g Oral TID AC   Continuous Infusions:        Glade Lloyd, MD [facility] 08/01/2023, 11:07 AM

## 2023-08-01 NOTE — Progress Notes (Signed)
Subjective: Patient reports  patient is awake and alert doing well lower extremities without pain  Objective: Vital signs in last 24 hours: Temp:  [98.3 F (36.8 C)-100 F (37.8 C)] 98.4 F (36.9 C) (11/30 0727) Pulse Rate:  [78-92] 79 (11/30 0253) Resp:  [16-18] 18 (11/30 0727) BP: (109-138)/(60-74) 109/68 (11/30 0727) SpO2:  [90 %-96 %] 94 % (11/30 0727)  Intake/Output from previous day: No intake/output data recorded. Intake/Output this shift: No intake/output data recorded.  Strength 5 out of 5 incision clean dry and intact  Lab Results: Recent Labs    07/30/23 1715 08/01/23 0359  WBC 10.6* 10.0  HGB 9.9* 10.1*  HCT 30.6* 31.0*  PLT 249 309   BMET Recent Labs    08/01/23 0359  NA 137  K 4.0  CL 102  CO2 28  GLUCOSE 138*  BUN 13  CREATININE 0.79  CALCIUM 8.7*    Studies/Results: DG Chest 1 View  Result Date: 07/30/2023 CLINICAL DATA:  Fever.  Increased drowsiness since this morning EXAM: CHEST  1 VIEW COMPARISON:  Chest x-ray 02/18/2023 and older FINDINGS: Hyperinflation. Stable cardiopericardial silhouette. No pneumothorax or edema. Slight blunting of the inferior costophrenic angles bilaterally. Tiny effusions are possible. No consolidation. Degenerative changes of the spine. Fixation hardware along the lower cervical spine. IMPRESSION: Hyperinflation.  Question tiny pleural effusions. Electronically Signed   By: Karen Kays M.D.   On: 07/30/2023 18:43    Assessment/Plan: Postop day 5 posterior lumbar fusion making progress still with orthostasis appreciate internal medicine's help working up fever.  Patient is being treated for UTI currently  LOS: 5 days     Mariam Dollar 08/01/2023, 8:33 AM

## 2023-08-02 DIAGNOSIS — M4316 Spondylolisthesis, lumbar region: Secondary | ICD-10-CM | POA: Diagnosis not present

## 2023-08-02 LAB — GLUCOSE, CAPILLARY
Glucose-Capillary: 106 mg/dL — ABNORMAL HIGH (ref 70–99)
Glucose-Capillary: 108 mg/dL — ABNORMAL HIGH (ref 70–99)
Glucose-Capillary: 143 mg/dL — ABNORMAL HIGH (ref 70–99)
Glucose-Capillary: 96 mg/dL (ref 70–99)

## 2023-08-02 LAB — URINE CULTURE: Culture: <10000 — AB

## 2023-08-02 IMAGING — DX DG LUMBAR SPINE COMPLETE 4+V
5 series · 5 of 5 positions shown · non-contrast
Comparison: 09/29/2018

CLINICAL DATA: Back pain, right radicular pain

EXAM:
LUMBAR SPINE - COMPLETE 4+ VIEW

[lumbar spine ap]
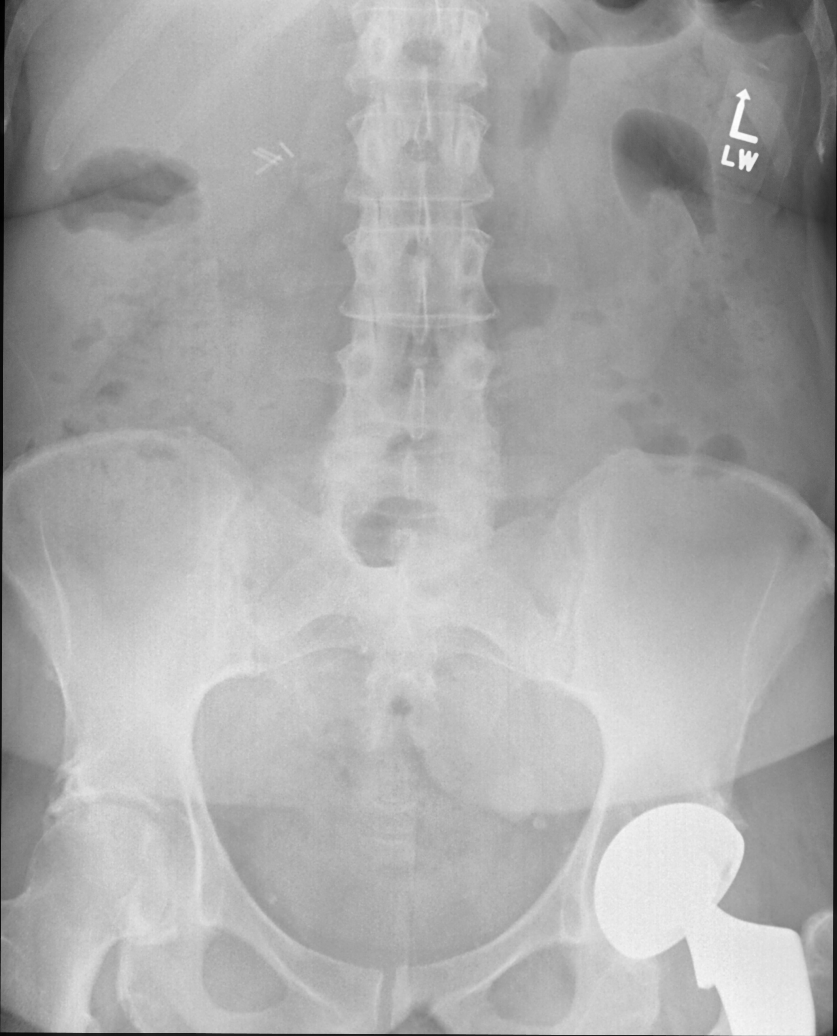

[lumbar spine obl (oblique) (1 of 2)]
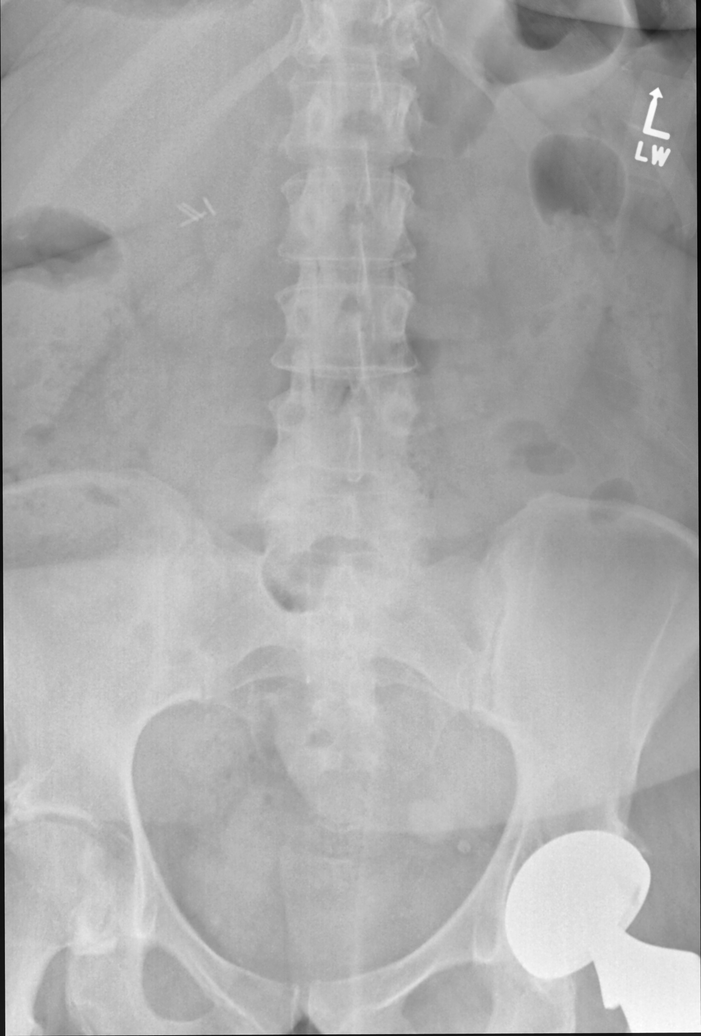

[lumbar spine obl (oblique) (2 of 2)]
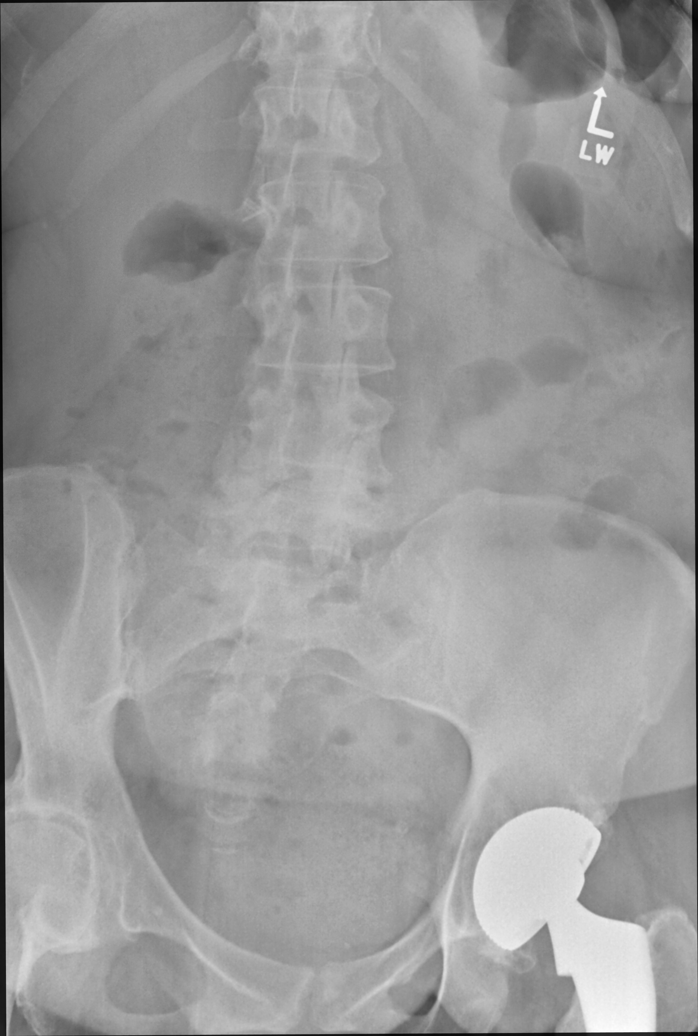

[lumbar spine lat]
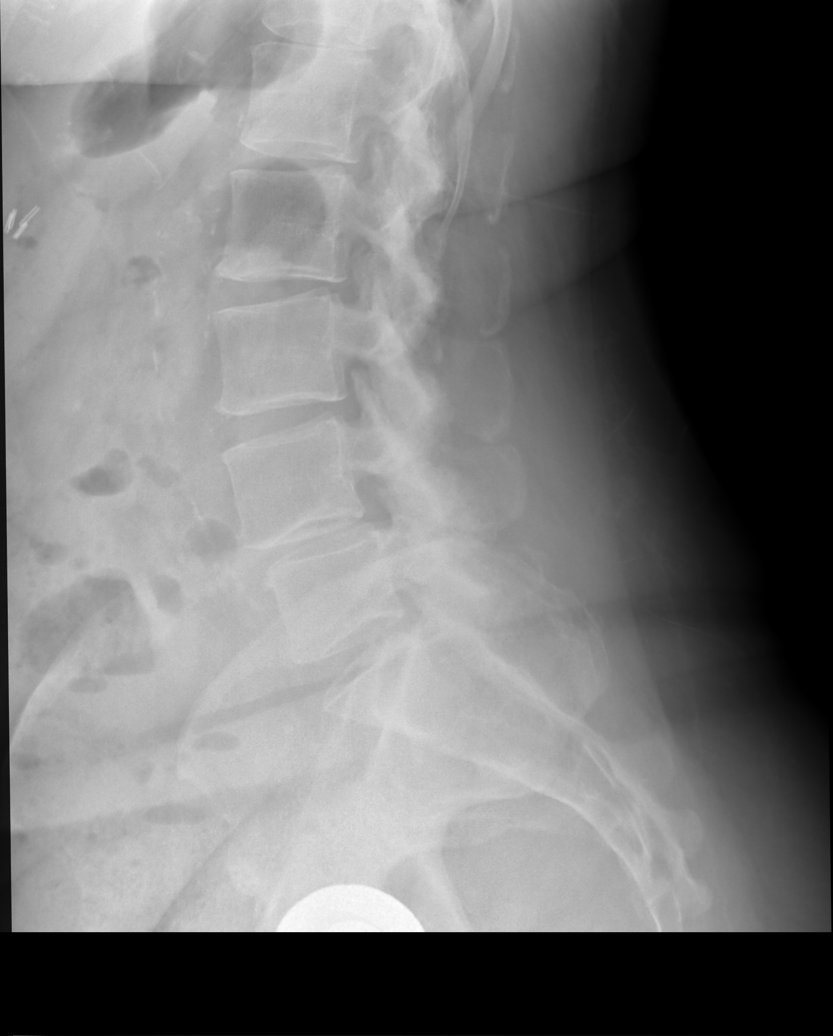

[lumbar spot lat]
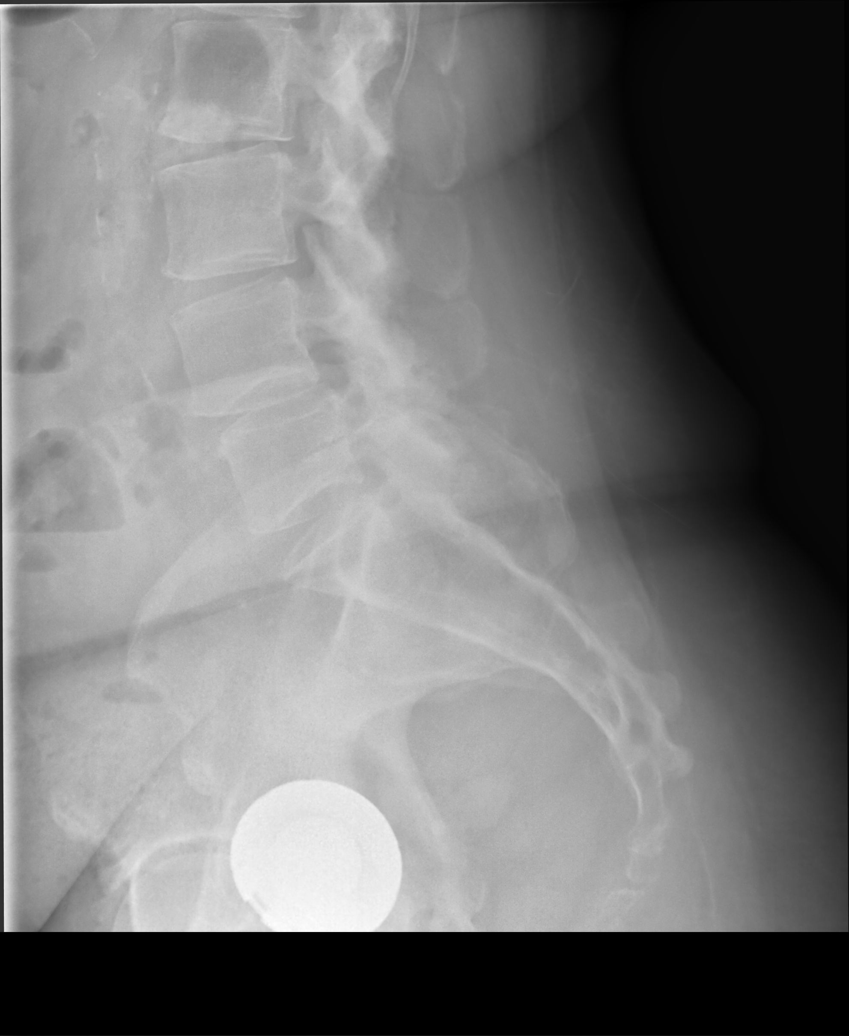

[5 of 5 positions shown; findings below may reference images not displayed]

FINDINGS: No recent fracture is seen. There is minimal anterolisthesis at
L4-L5 level. Degenerative changes are noted with bony spurs and
facet hypertrophy more severe at L4-L5 and L5-S1 levels. There is
previous left hip arthroplasty. Surgical clips are seen in right
upper quadrant. There is interval removal of IVC filter. There is
previous left hip arthroplasty. Degenerative changes are noted in
the right hip.
IMPRESSION: No recent fracture is seen in the lumbar spine. Degenerative changes
are noted, more severe in the facet joints in the lower lumbar
spine. There is minimal anterolisthesis at L4-L5 level.

Degenerative changes are noted in the right hip.

## 2023-08-02 IMAGING — DX DG FEMUR 2+V*R*
4 series · 4 of 4 positions shown · non-contrast
Comparison: Pelvis and right hip radiographs 09/29/2018

CLINICAL DATA: History of left hip arthroplasty. Right hip and
right leg pain.

EXAM:
RIGHT FEMUR 2 VIEWS; DG HIP (WITH OR WITHOUT PELVIS) 3-4V BILAT

[upper leg with knee ap]
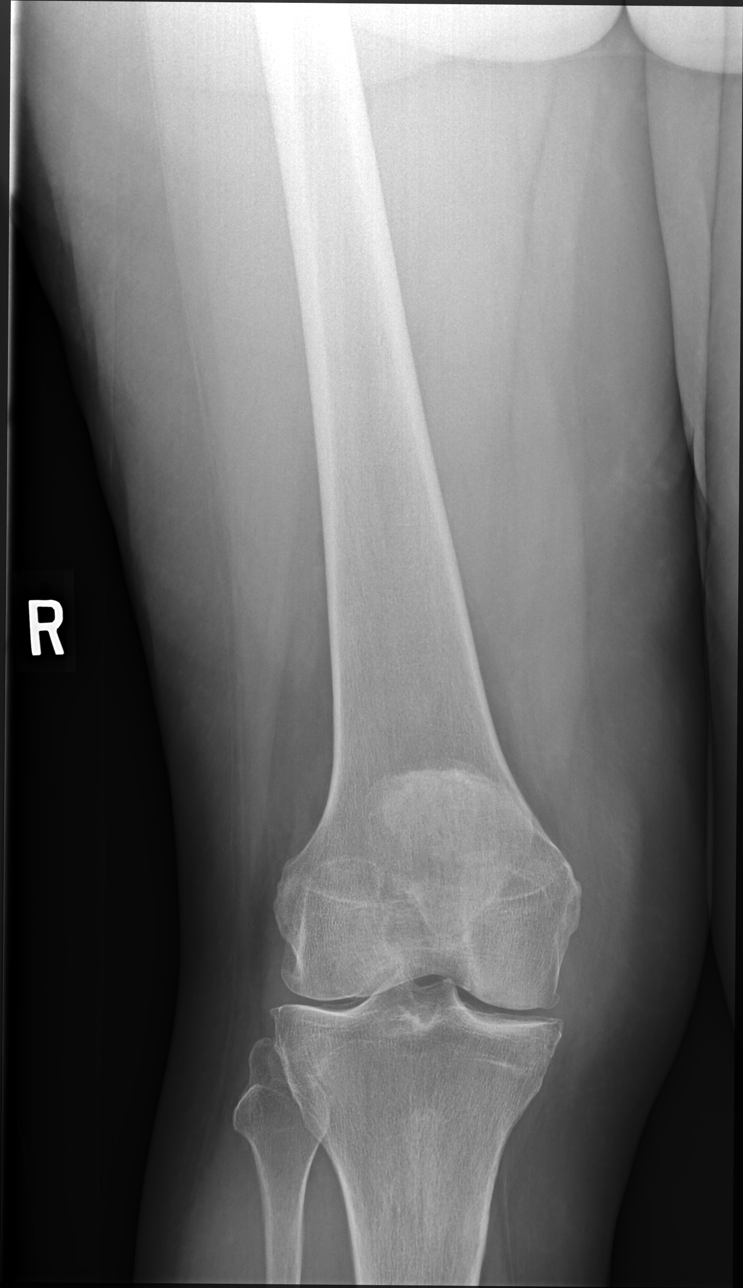

[upper leg with knee lat]
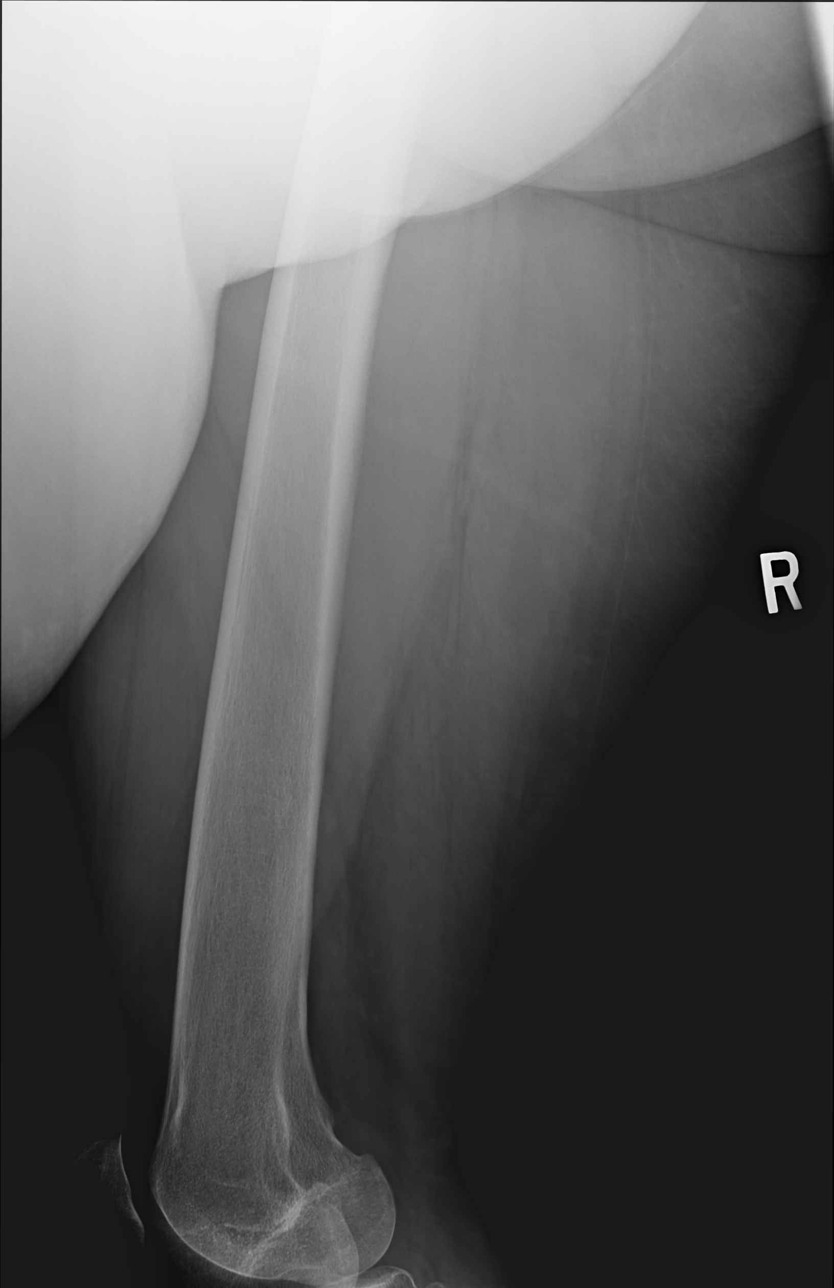

[upper leg with hip ap]
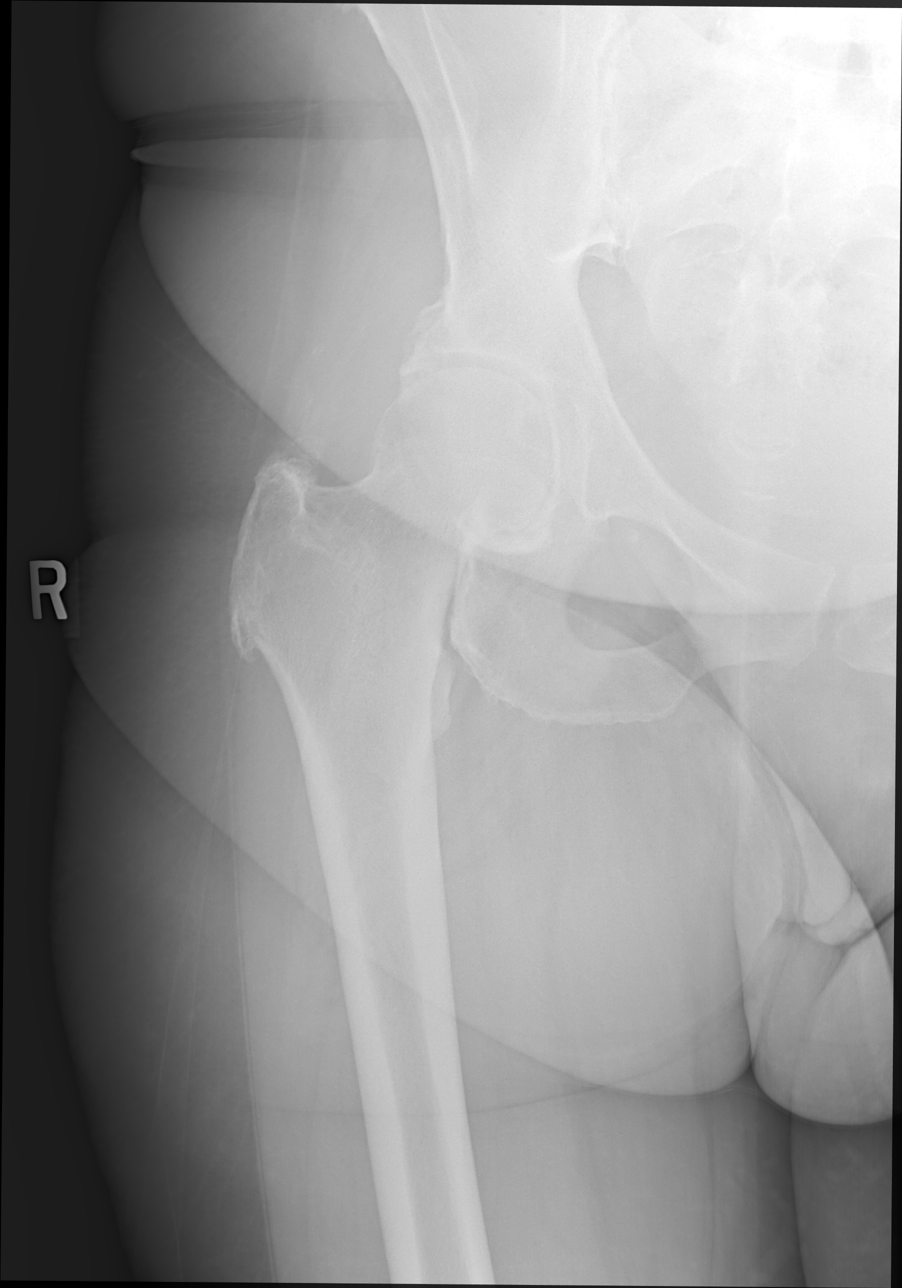

[upper leg with hip lat]
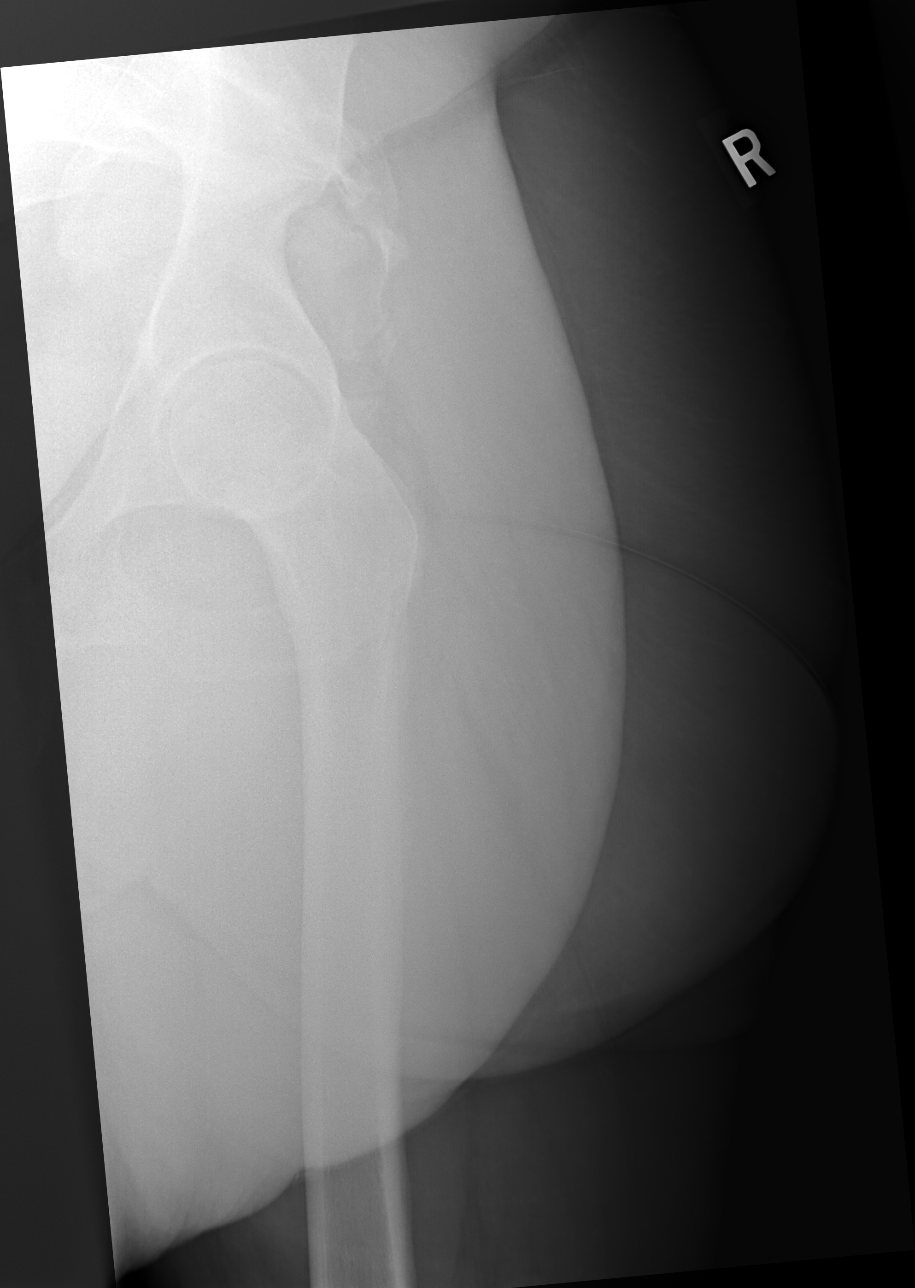

[4 of 4 positions shown; findings below may reference images not displayed]

FINDINGS: Pelvis and bilateral hips

Postsurgical changes of total left hip arthroplasty. No perihardware
lucency is seen to indicate hardware failure or loosening.

Moderate right femoroacetabular joint space narrowing with
peripheral acetabular and femoral head-neck junction degenerative
osteophytosis. Mild bilateral sacroiliac joint space narrowing and
subchondral sclerosis degenerative change. A vascular phlebolith
again overlies the left hemipelvis.

No acute fracture or dislocation.

--

Right femur:

Moderate lateral and mild medial compartment of the knee joint space
narrowing. No acute fracture is seen within the right femur.
IMPRESSION: :
IMPRESSION: 1. Moderate right femoroacetabular osteoarthritis.
2. Status post total left hip arthroplasty without evidence of
hardware failure.
3. No acute fracture.

## 2023-08-02 IMAGING — DX DG KNEE COMPLETE 4+V*R*
5 series · 5 of 5 positions shown · non-contrast
Comparison: None.

CLINICAL DATA: Anterior right knee pain

EXAM:
RIGHT KNEE - COMPLETE 4+ VIEW

[knee standing ap]
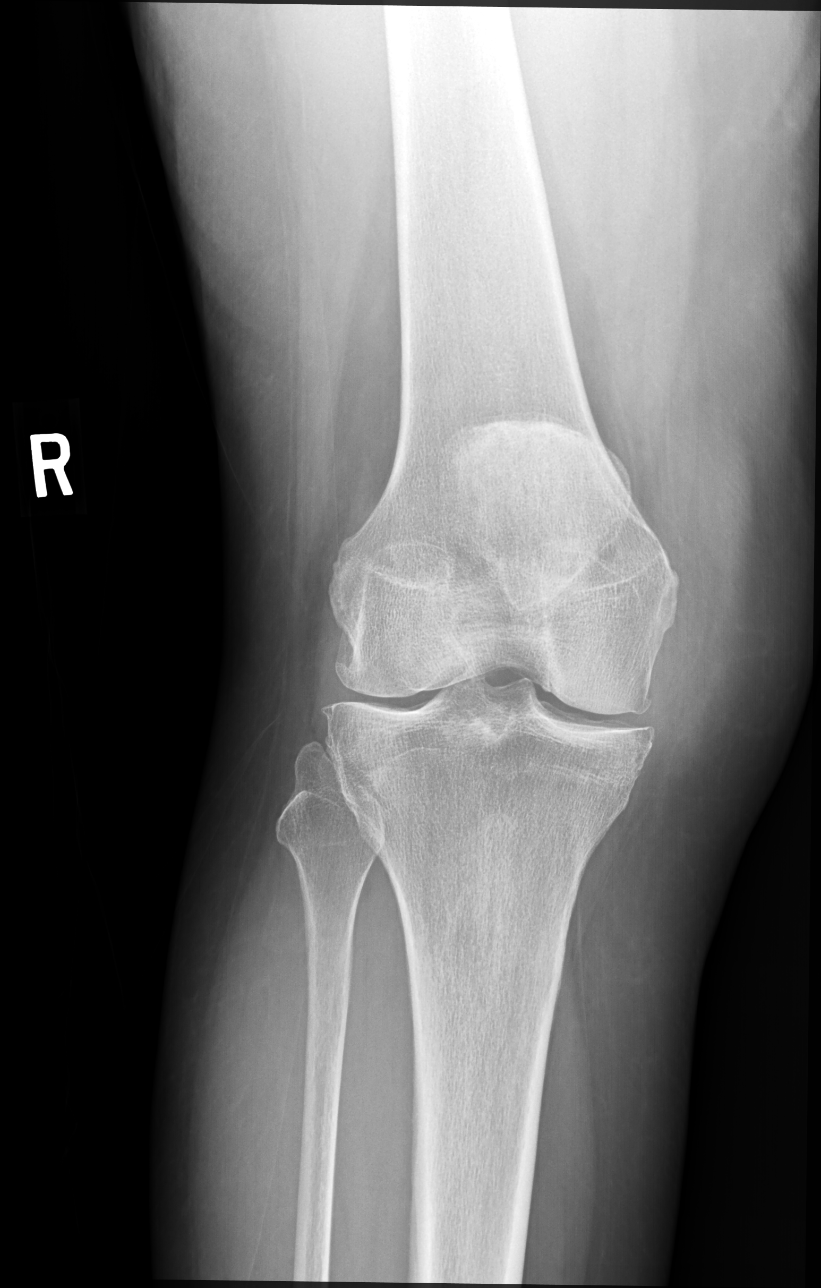

[knee standing external ap]
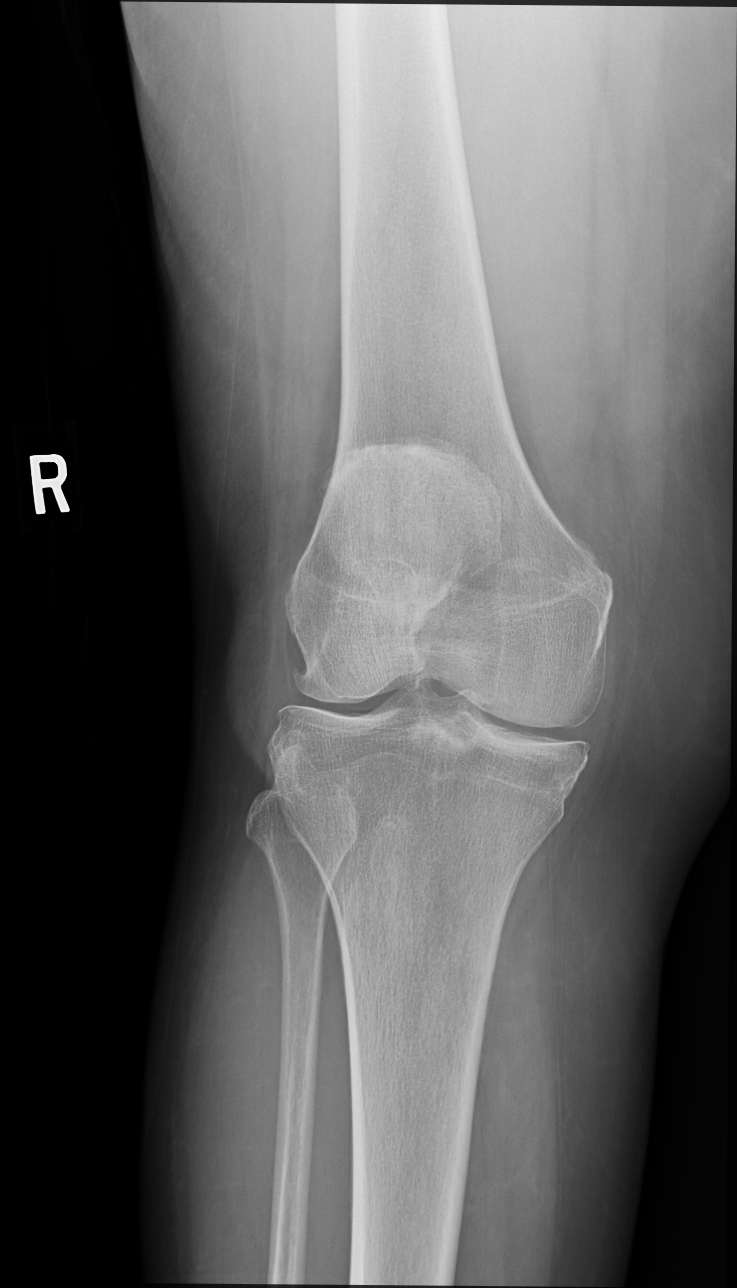

[knee standing internal ap]
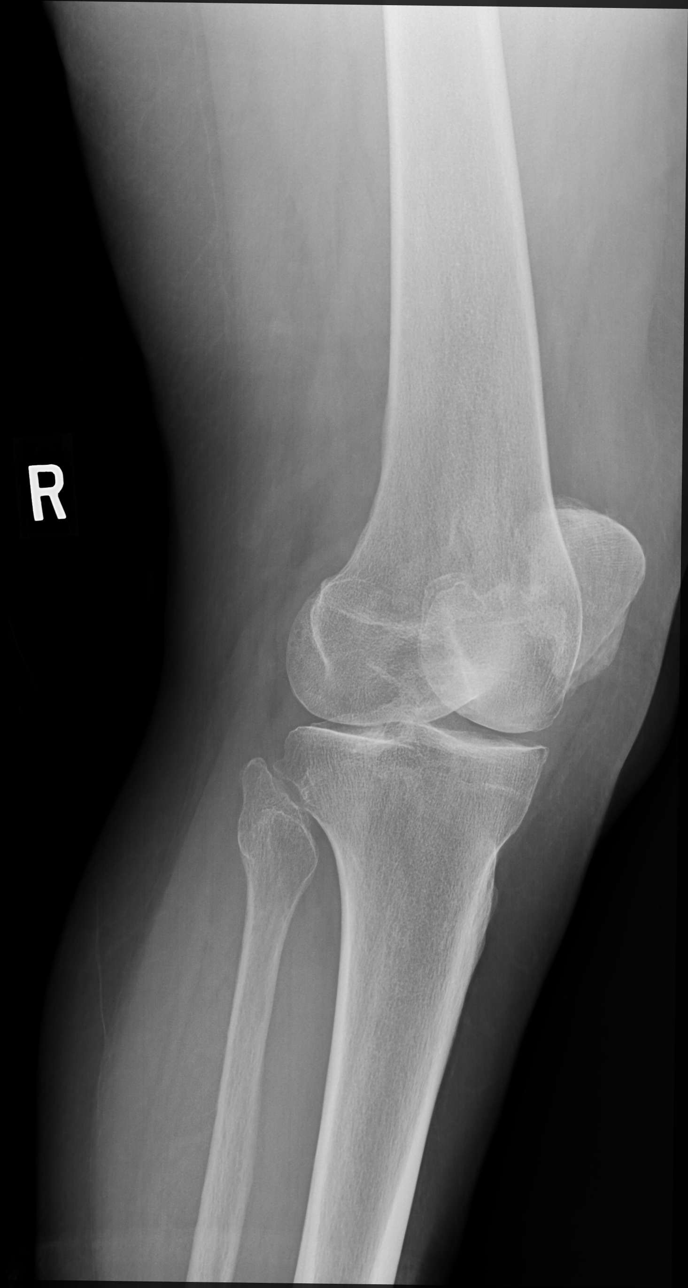

[knee standing lat]
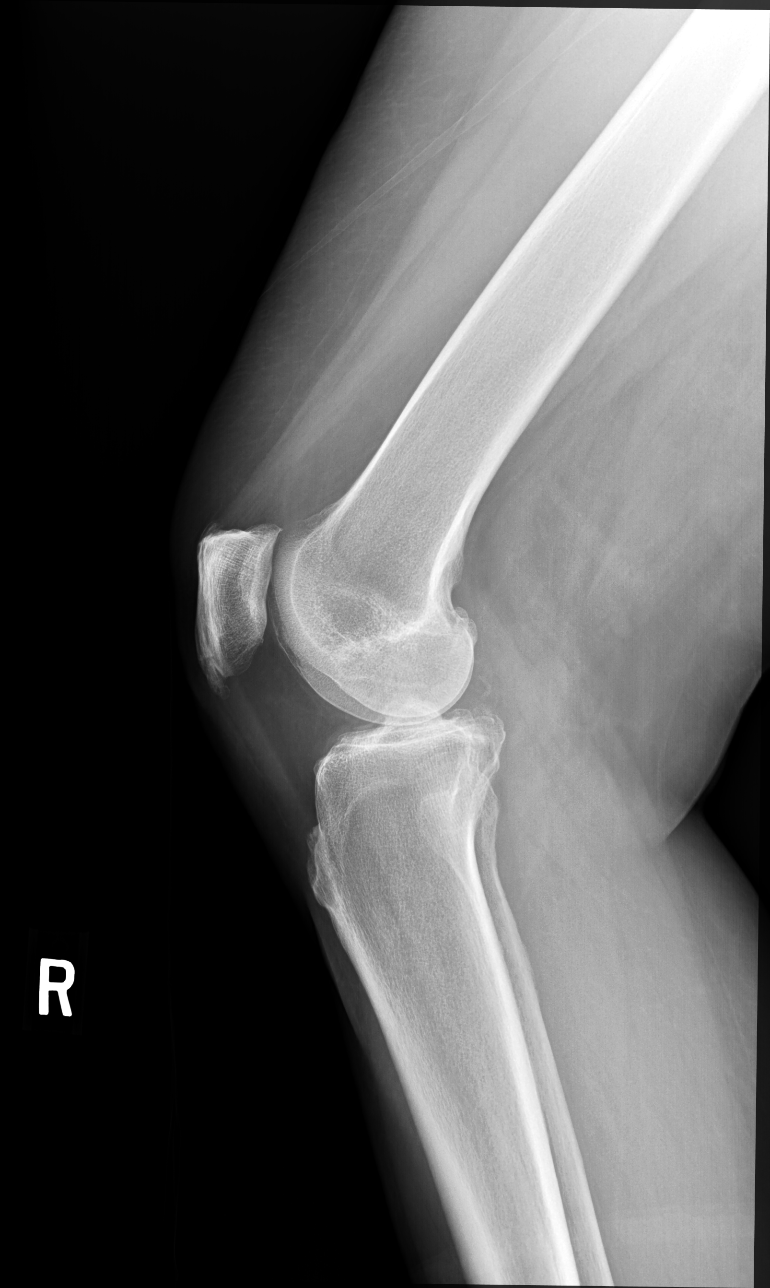

[knee [person_name] view pa]
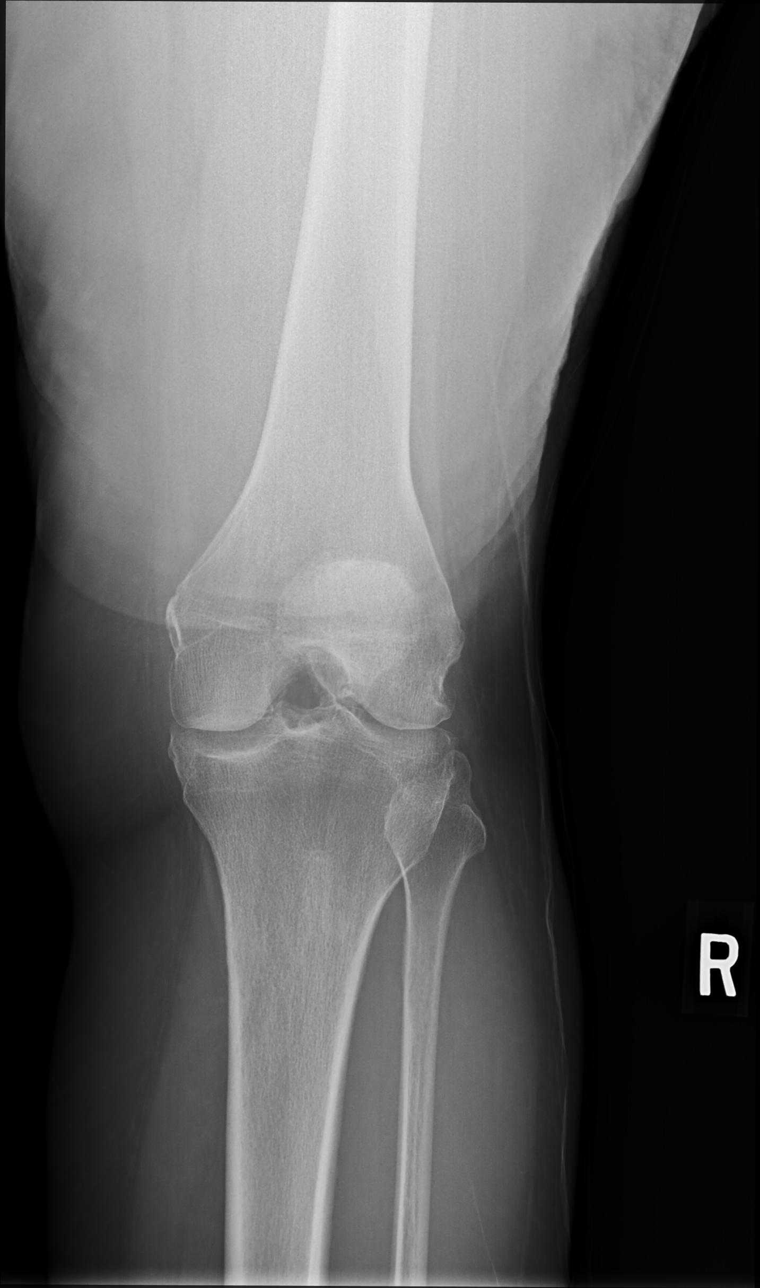

[5 of 5 positions shown; findings below may reference images not displayed]

FINDINGS: Mild to moderate lateral greater than medial compartment joint space
narrowing and peripheral osteophytosis. Mild patellofemoral joint
space narrowing with moderate superior degenerative osteophytes.
Mild chronic enthesopathic change/spurring at the inferior patellar
pole at the proximal patellar tendon origin. Trace enthesopathic
change at the quadriceps insertion on the patella. No joint
effusion. No acute fracture or dislocation.
IMPRESSION: :
IMPRESSION: 1. Mild-to-moderate tricompartmental osteoarthritis.
2. Mild chronic enthesopathic change at the patellar origin off of
the inferior patella.

## 2023-08-02 IMAGING — DX DG HIP (WITH OR WITHOUT PELVIS) 3-4V BILAT
5 series · 5 of 5 positions shown · non-contrast
Comparison: Pelvis and right hip radiographs 09/29/2018

CLINICAL DATA: History of left hip arthroplasty. Right hip and
right leg pain.

EXAM:
RIGHT FEMUR 2 VIEWS; DG HIP (WITH OR WITHOUT PELVIS) 3-4V BILAT

[pelvis ap]
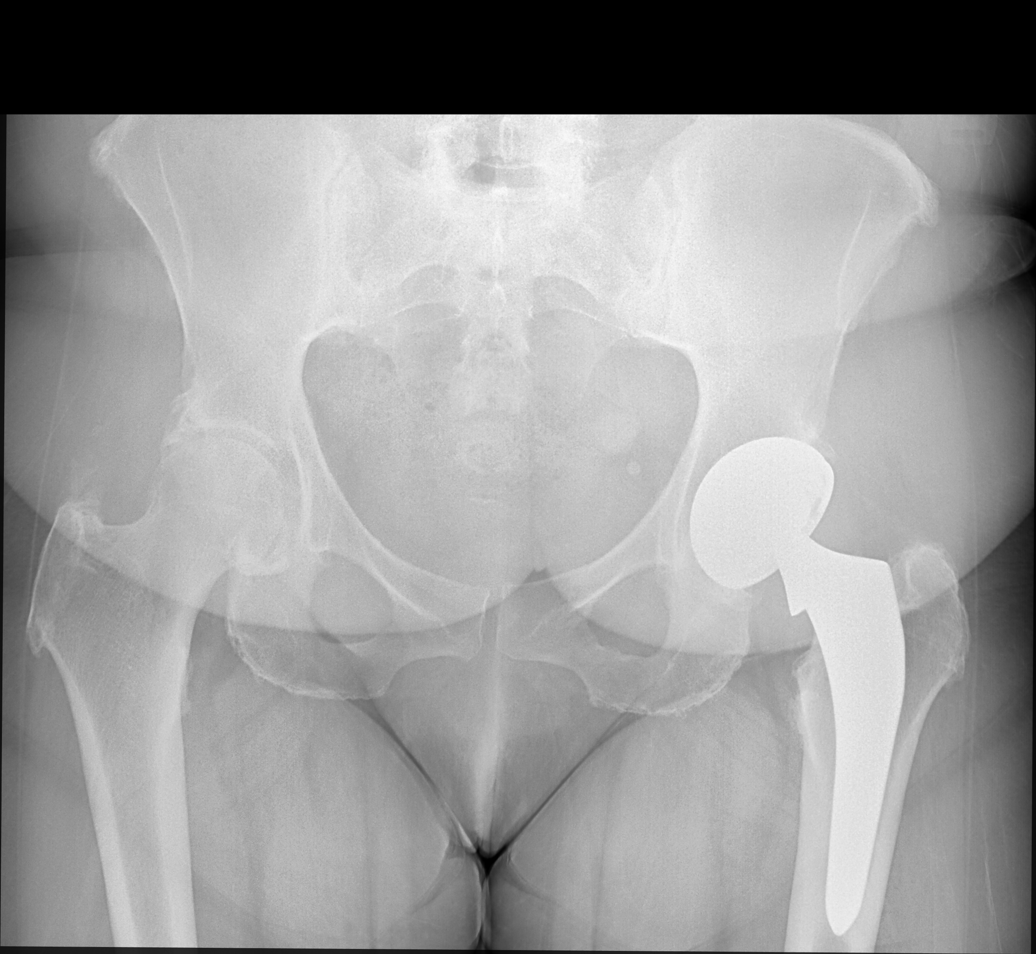

[hip joint ap (1 of 2)]
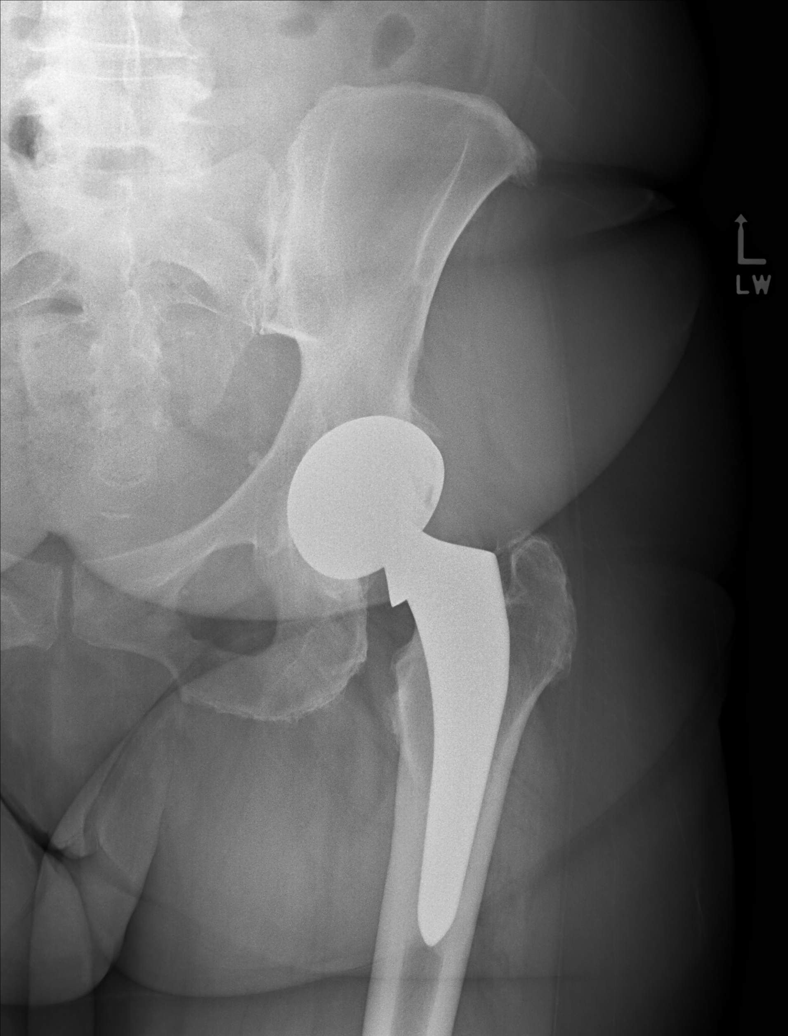

[ap frog obl (oblique) (1 of 2)]
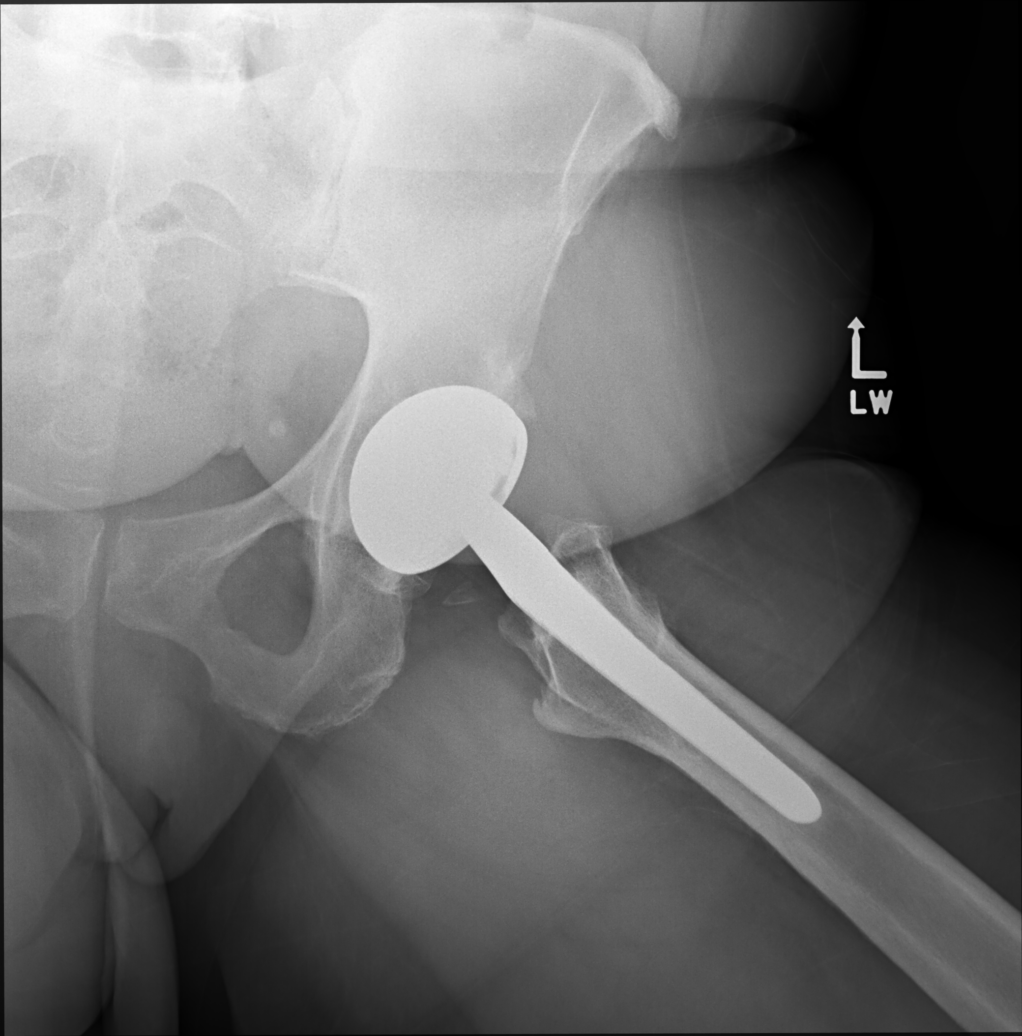

[hip joint ap (2 of 2)]
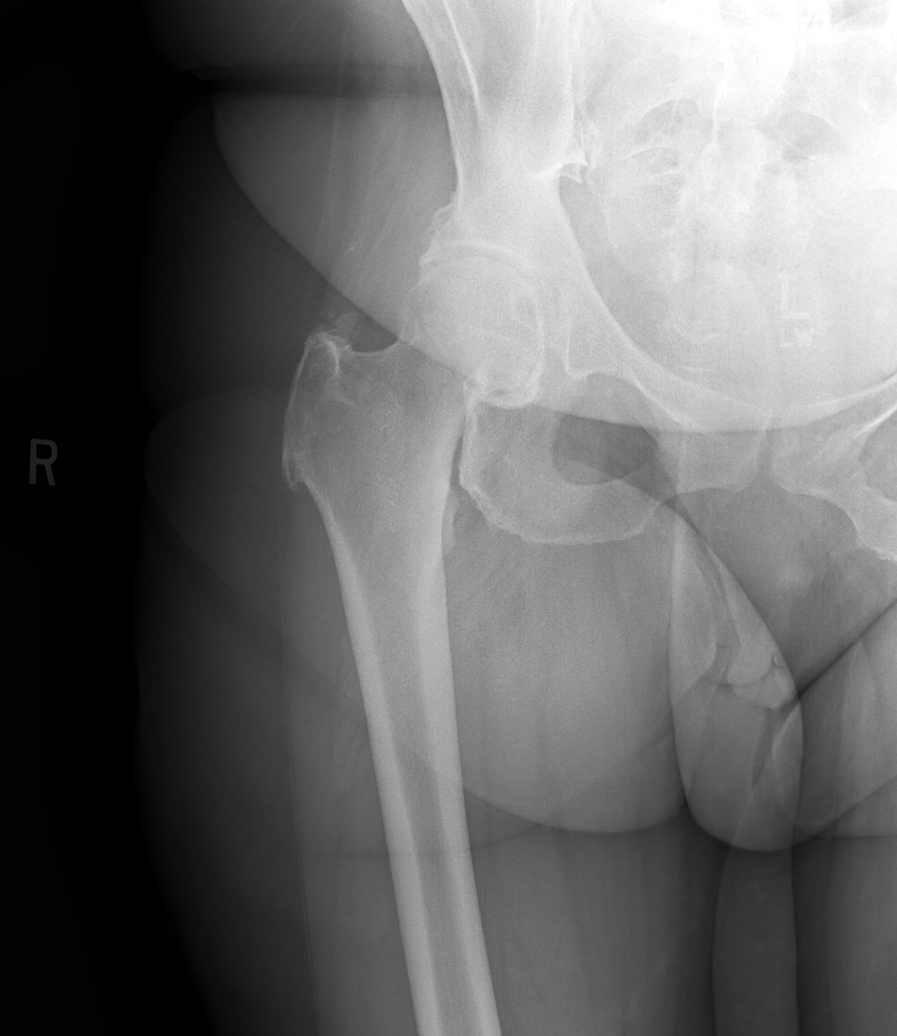

[ap frog obl (oblique) (2 of 2)]
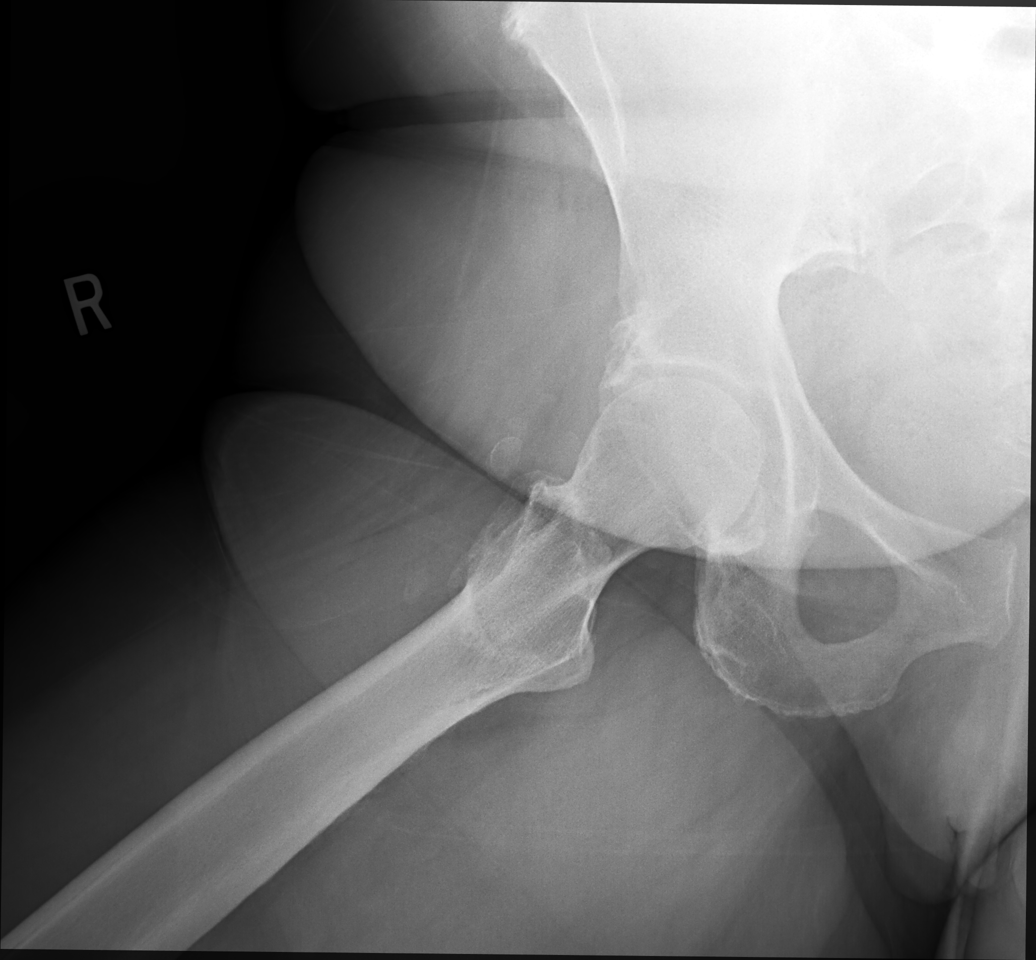

[5 of 5 positions shown; findings below may reference images not displayed]

FINDINGS: Pelvis and bilateral hips

Postsurgical changes of total left hip arthroplasty. No perihardware
lucency is seen to indicate hardware failure or loosening.

Moderate right femoroacetabular joint space narrowing with
peripheral acetabular and femoral head-neck junction degenerative
osteophytosis. Mild bilateral sacroiliac joint space narrowing and
subchondral sclerosis degenerative change. A vascular phlebolith
again overlies the left hemipelvis.

No acute fracture or dislocation.

--

Right femur:

Moderate lateral and mild medial compartment of the knee joint space
narrowing. No acute fracture is seen within the right femur.
IMPRESSION: :
IMPRESSION: 1. Moderate right femoroacetabular osteoarthritis.
2. Status post total left hip arthroplasty without evidence of
hardware failure.
3. No acute fracture.

## 2023-08-02 MED ORDER — MAGNESIUM CITRATE PO SOLN
1.0000 | Freq: Once | ORAL | Status: AC
  Administered 2023-08-02: 1 via ORAL
  Filled 2023-08-02 (×2): qty 296

## 2023-08-02 MED ORDER — MAGNESIUM CITRATE PO SOLN
300.0000 mL | Freq: Once | ORAL | Status: DC
  Filled 2023-08-02: qty 592

## 2023-08-02 NOTE — Progress Notes (Signed)
NEUROSURGERY PROGRESS NOTE  Doing well. Has been ambulating hallway more. Still has not had a bm. Will work on this today. Added mag citrate.  Disposition: rehab  Temp:  [98.4 F (36.9 C)-99.2 F (37.3 C)] 99.2 F (37.3 C) (12/01 0714) Pulse Rate:  [88-92] 88 (12/01 0714) Resp:  [16-18] 16 (12/01 0714) BP: (108-128)/(56-76) 108/56 (12/01 0714) SpO2:  [92 %-100 %] 92 % (12/01 0714)   Sherryl Manges, NP 08/02/2023 8:30 AM

## 2023-08-02 NOTE — Progress Notes (Signed)
PROGRESS NOTE    Valerie Vazquez  ZOX:[id number] DOB: 1956-02-28 DOA: 07/27/2023 PCP: Glori Luis, MD   Brief Narrative:  67 year old with history of DDD, HLD, hypothyroidism, GERD underwent decompressive laminectomies and lumbar fusion by neurosurgery on 07/27/2023.  TRH was consulted on 07/31/2023 for fever with possible UTI.  Assessment & Plan:   UTI -Urine culture pending.  Continue Rocephin. -Temperature max of 99.2 over the last 24 hours  Hypertension hyperlipidemia -Continue Lasix, Imdur and statin.  Blood pressure currently stable  Leukocytosis -Resolved  Anemia of chronic disease -Continue Lasix, Imdur and statin.  Blood pressure currently stable  Diabetes mellitus type 2 -Blood sugars stable.  Continue Jardiance and CBGs with SSI.  Carb modified diet.  GERD -Continue Protonix and sucralfate   Subjective: Patient seen and examined at bedside.  Denies worsening fever, vomiting, chest pain or shortness of breath.  Objective: Vitals:   08/01/23 1936 08/01/23 2320 08/02/23 0306 08/02/23 0714  BP: 124/66 123/68 128/71 (!) 108/56  Pulse: 92 90 91 88  Resp: 16 16 16 16   Temp: 98.7 F (37.1 C) 99.2 F (37.3 C) 99.2 F (37.3 C) 99.2 F (37.3 C)  TempSrc: Oral Oral Oral Oral  SpO2: 93% 95% 100% 92%  Weight:      Height:        Intake/Output Summary (Last 24 hours) at 08/02/2023 0740 Last data filed at 08/02/2023 0103 Gross per 24 hour  Intake 100.94 ml  Output 252 ml  Net -151.06 ml   Filed Weights   07/27/23 0554  Weight: 88.5 kg    Examination:  General: Currently on room air.  No distress.  respiratory: Decreased breath sounds at bases bilaterally with some crackles CVS: Currently rate controlled; S1-S2 heard  abdominal: Soft, obese, nontender, slightly distended, no organomegaly; normal bowel sounds are heard  extremities: Trace lower extremity edema; no clubbing.      Data Reviewed: I have personally reviewed following labs and  imaging studies  CBC: Recent Labs  Lab 07/28/23 1534 07/30/23 1715 08/01/23 0359  WBC 12.0* 10.6* 10.0  NEUTROABS 8.6* 6.7  --   HGB 9.3* 9.9* 10.1*  HCT 28.4* 30.6* 31.0*  MCV 96.9 97.1 96.6  PLT 225 249 309   Basic Metabolic Panel: Recent Labs  Lab 07/28/23 1534 08/01/23 0359  NA 144 137  K 3.7 4.0  CL 106 102  CO2 30 28  GLUCOSE 113* 138*  BUN 11 13  CREATININE 0.95 0.79  CALCIUM 8.8* 8.7*   GFR: Estimated Creatinine Clearance: 74.4 mL/min (by C-G formula based on SCr of 0.79 mg/dL). Liver Function Tests: No results for input(s): "AST", "ALT", "ALKPHOS", "BILITOT", "PROT", "ALBUMIN" in the last 168 hours. No results for input(s): "LIPASE", "AMYLASE" in the last 168 hours. No results for input(s): "AMMONIA" in the last 168 hours. Coagulation Profile: No results for input(s): "INR", "PROTIME" in the last 168 hours. Cardiac Enzymes: No results for input(s): "CKTOTAL", "CKMB", "CKMBINDEX", "TROPONINI" in the last 168 hours. BNP (last 3 results) No results for input(s): "PROBNP" in the last 8760 hours. HbA1C: No results for input(s): "HGBA1C" in the last 72 hours. CBG: Recent Labs  Lab 07/31/23 2113 08/01/23 0727 08/01/23 1108 08/01/23 1554 08/01/23 2030  GLUCAP 125* 100* 163* 150* 102*   Lipid Profile: No results for input(s): "CHOL", "HDL", "LDLCALC", "TRIG", "CHOLHDL", "LDLDIRECT" in the last 72 hours. Thyroid Function Tests: No results for input(s): "TSH", "T4TOTAL", "FREET4", "T3FREE", "THYROIDAB" in the last 72 hours. Anemia Panel: No results for  input(s): "VITAMINB12", "FOLATE", "FERRITIN", "TIBC", "IRON", "RETICCTPCT" in the last 72 hours. Sepsis Labs: No results for input(s): "PROCALCITON", "LATICACIDVEN" in the last 168 hours.  No results found for this or any previous visit (from the past 240 hour(s)).        Radiology Studies: No results found.      Scheduled Meds:  baclofen  10 mg Oral TID   cyanocobalamin  1,000 mcg Oral Daily    dicyclomine  20 mg Oral TID AC & HS   docusate sodium  100 mg Oral BID   empagliflozin  25 mg Oral Daily   escitalopram  20 mg Oral Q24H   famotidine  20 mg Oral QHS   ferrous sulfate  325 mg Oral Q breakfast   fluticasone  2 spray Each Nare Daily   furosemide  20 mg Oral Daily   gabapentin  600 mg Oral Q24H   gabapentin  900 mg Oral Q24H   insulin aspart  0-15 Units Subcutaneous TID WC   isosorbide mononitrate  15 mg Oral Daily   levothyroxine  25 mcg Oral Q0600   magnesium oxide  200 mg Oral Daily   multivitamin with minerals  1 tablet Oral Daily   OLANZapine  10 mg Oral QHS   pantoprazole  40 mg Oral Daily   polyethylene glycol  17 g Oral Daily   rosuvastatin  40 mg Oral QHS   sodium chloride flush  3 mL Intravenous Q12H   sucralfate  1 g Oral TID AC   Continuous Infusions:  cefTRIAXone (ROCEPHIN)  IV Stopped (08/01/23 1241)          Glade Lloyd, MD [facility] 08/02/2023, 7:40 AM

## 2023-08-03 DIAGNOSIS — D649 Anemia, unspecified: Secondary | ICD-10-CM | POA: Diagnosis not present

## 2023-08-03 DIAGNOSIS — I110 Hypertensive heart disease with heart failure: Secondary | ICD-10-CM | POA: Diagnosis not present

## 2023-08-03 DIAGNOSIS — E785 Hyperlipidemia, unspecified: Secondary | ICD-10-CM | POA: Diagnosis not present

## 2023-08-03 DIAGNOSIS — Z743 Need for continuous supervision: Secondary | ICD-10-CM | POA: Diagnosis not present

## 2023-08-03 DIAGNOSIS — I2510 Atherosclerotic heart disease of native coronary artery without angina pectoris: Secondary | ICD-10-CM | POA: Diagnosis not present

## 2023-08-03 DIAGNOSIS — R278 Other lack of coordination: Secondary | ICD-10-CM | POA: Diagnosis not present

## 2023-08-03 DIAGNOSIS — K5900 Constipation, unspecified: Secondary | ICD-10-CM | POA: Diagnosis not present

## 2023-08-03 DIAGNOSIS — M4316 Spondylolisthesis, lumbar region: Secondary | ICD-10-CM | POA: Diagnosis not present

## 2023-08-03 DIAGNOSIS — R2681 Unsteadiness on feet: Secondary | ICD-10-CM | POA: Diagnosis not present

## 2023-08-03 DIAGNOSIS — M549 Dorsalgia, unspecified: Secondary | ICD-10-CM | POA: Diagnosis not present

## 2023-08-03 DIAGNOSIS — I509 Heart failure, unspecified: Secondary | ICD-10-CM | POA: Diagnosis not present

## 2023-08-03 DIAGNOSIS — M48061 Spinal stenosis, lumbar region without neurogenic claudication: Secondary | ICD-10-CM | POA: Diagnosis not present

## 2023-08-03 DIAGNOSIS — I10 Essential (primary) hypertension: Secondary | ICD-10-CM | POA: Diagnosis not present

## 2023-08-03 DIAGNOSIS — E039 Hypothyroidism, unspecified: Secondary | ICD-10-CM | POA: Diagnosis not present

## 2023-08-03 DIAGNOSIS — Z9889 Other specified postprocedural states: Secondary | ICD-10-CM | POA: Diagnosis not present

## 2023-08-03 DIAGNOSIS — K219 Gastro-esophageal reflux disease without esophagitis: Secondary | ICD-10-CM | POA: Diagnosis not present

## 2023-08-03 DIAGNOSIS — N3000 Acute cystitis without hematuria: Secondary | ICD-10-CM | POA: Diagnosis not present

## 2023-08-03 DIAGNOSIS — I5032 Chronic diastolic (congestive) heart failure: Secondary | ICD-10-CM | POA: Diagnosis not present

## 2023-08-03 DIAGNOSIS — M6281 Muscle weakness (generalized): Secondary | ICD-10-CM | POA: Diagnosis not present

## 2023-08-03 DIAGNOSIS — Z7401 Bed confinement status: Secondary | ICD-10-CM | POA: Diagnosis not present

## 2023-08-03 DIAGNOSIS — K589 Irritable bowel syndrome without diarrhea: Secondary | ICD-10-CM | POA: Diagnosis not present

## 2023-08-03 DIAGNOSIS — E119 Type 2 diabetes mellitus without complications: Secondary | ICD-10-CM | POA: Diagnosis not present

## 2023-08-03 DIAGNOSIS — M4807 Spinal stenosis, lumbosacral region: Secondary | ICD-10-CM | POA: Diagnosis not present

## 2023-08-03 LAB — GLUCOSE, CAPILLARY
Glucose-Capillary: 128 mg/dL — ABNORMAL HIGH (ref 70–99)
Glucose-Capillary: 116 mg/dL — ABNORMAL HIGH (ref 70–99)

## 2023-08-03 MED ORDER — TRAMADOL HCL 50 MG PO TABS: 50.0000 mg | ORAL_TABLET | Freq: Four times a day (QID) | ORAL | 0 refills | Status: DC | PRN

## 2023-08-03 NOTE — Progress Notes (Signed)
PROGRESS NOTE    Valerie Vazquez  AVW:[id number] DOB: 1955/10/27 DOA: 07/27/2023 PCP: Glori Luis, MD   Brief Narrative:  67 year old with history of DDD, HLD, hypothyroidism, GERD underwent decompressive laminectomies and lumbar fusion by neurosurgery on 07/27/2023.  TRH was consulted on 07/31/2023 for fever with possible UTI.  Assessment & Plan:   UTI -Urine culture showed insignificant growth.  Continue Rocephin for total of 3 days. -Currently afebrile.  Hypertension hyperlipidemia -Continue Lasix, Imdur and statin.  Blood pressure currently stable  Leukocytosis -Resolved  Anemia of chronic disease -Continue Lasix, Imdur and statin.  Blood pressure currently stable  Diabetes mellitus type 2 -Blood sugars stable.  Continue Jardiance and CBGs with SSI.  Carb modified diet.  GERD -Continue Protonix and sucralfate  Disposition: Currently medically stable for discharge.  Discharge planning as per primary neurosurgical team.   Subjective: Patient seen and examined at bedside.  No fever, abdominal pain, vomiting reported. Objective: Vitals:   08/02/23 1542 08/02/23 1921 08/02/23 2259 08/03/23 0253  BP: 116/68 130/69 (!) 120/57 115/69  Pulse:  83 81 79  Resp:   18 16  Temp:  98.8 F (37.1 C) 98.7 F (37.1 C) 98.6 F (37 C)  TempSrc:  Oral Oral Oral  SpO2:  95% 91% 91%  Weight:      Height:       No intake or output data in the 24 hours ending 08/03/23 0748  Filed Weights   07/27/23 0554  Weight: 88.5 kg    Examination:  General: No acute distress.  On room air currently. respiratory: Bilateral decreased breath sounds at bases with scattered crackles CVS: S1-S2 heard; rate currently controlled  abdominal: Soft, obese, nontender, distended mildly; no organomegaly; bowel sounds normally heard  extremities: No cyanosis; mild lower extremity edema present   Data Reviewed: I have personally reviewed following labs and imaging studies  CBC: Recent  Labs  Lab 07/28/23 1534 07/30/23 1715 08/01/23 0359  WBC 12.0* 10.6* 10.0  NEUTROABS 8.6* 6.7  --   HGB 9.3* 9.9* 10.1*  HCT 28.4* 30.6* 31.0*  MCV 96.9 97.1 96.6  PLT 225 249 309   Basic Metabolic Panel: Recent Labs  Lab 07/28/23 1534 08/01/23 0359  NA 144 137  K 3.7 4.0  CL 106 102  CO2 30 28  GLUCOSE 113* 138*  BUN 11 13  CREATININE 0.95 0.79  CALCIUM 8.8* 8.7*   GFR: Estimated Creatinine Clearance: 74.4 mL/min (by C-G formula based on SCr of 0.79 mg/dL). Liver Function Tests: No results for input(s): "AST", "ALT", "ALKPHOS", "BILITOT", "PROT", "ALBUMIN" in the last 168 hours. No results for input(s): "LIPASE", "AMYLASE" in the last 168 hours. No results for input(s): "AMMONIA" in the last 168 hours. Coagulation Profile: No results for input(s): "INR", "PROTIME" in the last 168 hours. Cardiac Enzymes: No results for input(s): "CKTOTAL", "CKMB", "CKMBINDEX", "TROPONINI" in the last 168 hours. BNP (last 3 results) No results for input(s): "PROBNP" in the last 8760 hours. HbA1C: No results for input(s): "HGBA1C" in the last 72 hours. CBG: Recent Labs  Lab 08/01/23 2030 08/02/23 0754 08/02/23 1137 08/02/23 1539 08/02/23 2114  GLUCAP 102* 106* 108* 143* 96   Lipid Profile: No results for input(s): "CHOL", "HDL", "LDLCALC", "TRIG", "CHOLHDL", "LDLDIRECT" in the last 72 hours. Thyroid Function Tests: No results for input(s): "TSH", "T4TOTAL", "FREET4", "T3FREE", "THYROIDAB" in the last 72 hours. Anemia Panel: No results for input(s): "VITAMINB12", "FOLATE", "FERRITIN", "TIBC", "IRON", "RETICCTPCT" in the last 72 hours. Sepsis Labs: No results  for input(s): "PROCALCITON", "LATICACIDVEN" in the last 168 hours.  Recent Results (from the past 240 hour(s))  Urine Culture (for pregnant, neutropenic or urologic patients or patients with an indwelling urinary catheter)     Status: Abnormal   Collection Time: 08/01/23 12:58 PM   Specimen: Urine, Clean Catch  Result  Value Ref Range Status   Specimen Description URINE, CLEAN CATCH  Final   Special Requests NONE  Final   Culture (A)  Final    <10,000 COLONIES/mL INSIGNIFICANT GROWTH Performed at [facility], [address], [place], [place] [address]    Report Status 08/02/2023 FINAL  Final          Radiology Studies: No results found.      Scheduled Meds:  baclofen  10 mg Oral TID   cyanocobalamin  1,000 mcg Oral Daily   dicyclomine  20 mg Oral TID AC & HS   docusate sodium  100 mg Oral BID   empagliflozin  25 mg Oral Daily   escitalopram  20 mg Oral Q24H   famotidine  20 mg Oral QHS   ferrous sulfate  325 mg Oral Q breakfast   fluticasone  2 spray Each Nare Daily   furosemide  20 mg Oral Daily   gabapentin  600 mg Oral Q24H   gabapentin  900 mg Oral Q24H   insulin aspart  0-15 Units Subcutaneous TID WC   isosorbide mononitrate  15 mg Oral Daily   levothyroxine  25 mcg Oral Q0600   magnesium oxide  200 mg Oral Daily   multivitamin with minerals  1 tablet Oral Daily   OLANZapine  10 mg Oral QHS   pantoprazole  40 mg Oral Daily   polyethylene glycol  17 g Oral Daily   rosuvastatin  40 mg Oral QHS   sodium chloride flush  3 mL Intravenous Q12H   sucralfate  1 g Oral TID AC   Continuous Infusions:  cefTRIAXone (ROCEPHIN)  IV 1 g (08/02/23 1201)          Glade Lloyd, MD [facility] 08/03/2023, 7:48 AM

## 2023-08-03 NOTE — Progress Notes (Signed)
Pt with orders to d/c to [facility]. Report called all questions answered. PIV removed. Pt is stable. Discharge packet and prescription provided to [facility]. Pt and all belongings transported with [facility].

## 2023-08-03 NOTE — TOC Transition Note (Signed)
Transition of Care [facility]) - CM/SW Discharge Note   Patient Details  Name: Valerie Vazquez MRN: [id number] Date of Birth: 06-24-1956  Transition of Care [facility]) CM/SW Contact:  Carmina Miller, LCSWA Phone Number: 08/03/2023, 11:17 AM   Clinical Narrative:     [facility] to: [facility] Anticipated DC date: 08/03/23 Family notified: Stephanie Coup Transport by: Sharin Mons   Per MD patient ready for DC to [facility]. RN to call report prior to discharge [phone]. RN, patient, patient's family, and facility notified of DC. Discharge Summary and FL2 sent to facility. DC packet on chart. Ambulance transport requested for patient set up for 1:00 pm pickup (at facility request).   CSW will sign off for now as social work intervention is no longer needed. Please consult [place] again if new needs arise.    Final next level of care: Skilled Nursing Facility Barriers to Discharge: Continued Medical Work up   Patient Goals and CMS Choice      Discharge Placement                         Discharge Plan and Services Additional resources added to the After Visit Summary for   In-house Referral: Clinical Social Work                                   Social Determinants of Health (SDOH) Interventions SDOH Screenings   Food Insecurity: No Food Insecurity (07/27/2023)  Recent Concern: Food Insecurity - Food Insecurity Present (07/27/2023)  Housing: Medium Risk (07/27/2023)  Transportation Needs: No Transportation Needs (07/27/2023)  Recent Concern: Transportation Needs - Unmet Transportation Needs (05/19/2023)  Utilities: Not At Risk (07/27/2023)  Alcohol Screen: Low Risk  (02/09/2023)  Depression (PHQ2-9): Low Risk  (06/19/2023)  Financial Resource Strain: High Risk (06/11/2023)  Physical Activity: Inactive (06/11/2023)  Social Connections: Moderately Integrated (06/11/2023)  Stress: No Stress Concern Present (06/11/2023)  Tobacco Use: Medium Risk (07/27/2023)      Readmission Risk Interventions     No data to display

## 2023-08-03 NOTE — Progress Notes (Signed)
Physical Therapy Treatment Patient Details Name: Valerie Vazquez MRN: [id number] DOB: 1956/04/13 Today's Date: 08/03/2023   History of Present Illness 67 y.o. female with history of diabetes, coronary vasospasm, DVT.  S/p Lumbar 3,4,5 PLIF.    PT Comments  Pt progressing well,  mobility and mentation have much improved.  Emphasis on transitions to/from EOB, safe sit to stands and progression of gait with RW12/10/2022  Jacinto Halim., PT Acute Rehabilitation Services [phone]  (office)    If plan is discharge home, recommend the following: A little help with bathing/dressing/bathroom;Assistance with cooking/housework;Assist for transportation   Can travel by private vehicle     No  Equipment Recommendations  None recommended by PT    Recommendations for Other Services       Precautions / Restrictions Precautions Precautions: Fall;Back Precaution Booklet Issued: Yes (comment) Required Braces or Orthoses: Spinal Brace Spinal Brace: Applied in sitting position Restrictions Weight Bearing Restrictions: No     Mobility  Bed Mobility Overal bed mobility: Needs Assistance Bed Mobility: Rolling, Sidelying to Sit, Sit to Sidelying Rolling: Contact guard assist Sidelying to sit: Contact guard assist     Sit to sidelying: Min assist General bed mobility comments: light min to get feet into bed.    Transfers Overall transfer level: Needs assistance Equipment used: Rolling walker (2 wheels) Transfers: Sit to/from Stand Sit to Stand: Contact guard assist   Step pivot transfers: Contact guard assist       General transfer comment: cues for hand placement/safety    Ambulation/Gait Ambulation/Gait assistance: Contact guard assist Gait Distance (Feet): 300 Feet Assistive device: Rolling walker (2 wheels) Gait Pattern/deviations: Step-through pattern   Gait velocity interpretation: 1.31 - 2.62 ft/sec, indicative of limited community ambulator   General Gait Details: slower,  though faster with cuing,  steady gait with appropriate use of the RW, no signs of dizziness/BP issues.  Still needs cues for hand placement.   Stairs             Wheelchair Mobility     Tilt Bed    Modified Rankin (Stroke Patients Only)       Balance Overall balance assessment: Needs assistance Sitting-balance support: Feet supported Sitting balance-Leahy Scale: Good Sitting balance - Comments: sitting EOB   Standing balance support: No upper extremity supported, During functional activity, Single extremity supported Standing balance-Leahy Scale: Fair Standing balance comment: generally reliant on RW during dynamic tasks                            Cognition Arousal: Alert Behavior During Therapy: WFL for tasks assessed/performed Overall Cognitive Status: Within Functional Limits for tasks assessed                                          Exercises      General Comments General comments (skin integrity, edema, etc.): reinforced bac care/prec, donning the brace/bracing issues, lifting restrictions and progression of gait/activity      Pertinent Vitals/Pain Pain Assessment Pain Assessment: Faces Faces Pain Scale: Hurts little more Pain Location: back, incision Pain Descriptors / Indicators: Discomfort Pain Intervention(s): Monitored during session    Home Living                          Prior Function  PT Goals (current goals can now be found in the care plan section) Acute Rehab PT Goals PT Goal Formulation: With patient Time For Goal Achievement: 08/11/23 Potential to Achieve Goals: Good Progress towards PT goals: Progressing toward goals    Frequency    Min 5X/week      PT Plan      Co-evaluation              AM-PAC PT "6 Clicks" Mobility   Outcome Measure  Help needed turning from your back to your side while in a flat bed without using bedrails?: A Little Help needed moving  from lying on your back to sitting on the side of a flat bed without using bedrails?: A Little Help needed moving to and from a bed to a chair (including a wheelchair)?: A Little Help needed standing up from a chair using your arms (e.g., wheelchair or bedside chair)?: A Little Help needed to walk in hospital room?: A Little Help needed climbing 3-5 steps with a railing? : A Little 6 Click Score: 18    End of Session   Activity Tolerance: Patient tolerated treatment well Patient left: in bed;with call bell/phone within reach Nurse Communication: Mobility status PT Visit Diagnosis: Other abnormalities of gait and mobility (R26.89);Pain Pain - part of body:  (back)     Time: 5784-6962 PT Time Calculation (min) (ACUTE ONLY): 23 min  Charges:    $Gait Training: 8-22 mins $Therapeutic Activity: 8-22 mins PT General Charges $$ ACUTE PT VISIT: 1 Visit                     08/03/2023  Jacinto Halim., PT Acute Rehabilitation Services [phone]  (office)   Eliseo Gum Meta Kroenke 08/03/2023, 11:42 AM

## 2023-08-03 NOTE — Care Management Important Message (Signed)
Important Message  Patient Details  Name: DOUGLASS SILVIUS MRN: [id number] Date of Birth: 04/19/1956   Important Message Given:  Yes - Medicare IM     Sherilyn Banker 08/03/2023, 3:04 PM

## 2023-08-03 NOTE — Discharge Summary (Signed)
Physician Discharge Summary  Patient ID: Valerie Vazquez MRN: [id number] DOB/AGE: 1955-12-21 66 y.o. Estimated body mass index is 31.47 kg/m as calculated from the following:   Height as of this encounter: 5\' 6"  (1.676 m).   Weight as of this encounter: 88.5 kg.   Admit date: 07/27/2023 Discharge date: 08/03/2023  Admission Diagnoses: Lumbar spinal stenosis L3-4 L4-5  Discharge Diagnoses: Same Principal Problem:   Spondylolisthesis at L4-L5 level Active Problems:   Acute cystitis without hematuria   Discharged Condition: good  Hospital Course: Patient was admitted to the hospital underwent decompressive laminectomies and interbody fusion at L3-4 and L4-5.  Postoperatively patient did very well with covering the floor on the floor was ambulating and voiding spontaneously was working with physical therapy and felt to benefit from inpatient rehab so they were consulted and patient has been accepted for transfer to a skilled nursing facility.  Patient is stable for transfer and should follow-up in approximately 2 weeks.  During patient's hospitalization the only active issue was some intermittent orthostasis that resolved with fluid boluses and time.  Consults: Significant Diagnostic Studies: Treatments: Decompressive laminectomy interbody fusion L3-4 L4-5 Discharge Exam: Blood pressure 107/68, pulse 79, temperature 98.7 F (37.1 C), temperature source Oral, resp. rate 16, height 5\' 6"  (1.676 m), weight 88.5 kg, SpO2 95%. Strength 5 out of 5 wound clean dry and intact  Disposition: Home   Allergies as of 08/03/2023       Reactions   Other Hives, Itching, Swelling   Hair Dye - Causes severe sleepiness    Abilify [aripiprazole] Other (See Comments)   Jerking movement, slow motor skills   Augmentin [amoxicillin-pot Clavulanate] Diarrhea, Nausea And Vomiting   Bactrim [sulfamethoxazole-trimethoprim] Diarrhea   Ceftin [cefuroxime Axetil] Diarrhea   Coconut (cocos Nucifera)    Rash     Diclofenac Other (See Comments)    Muscle Pain   Indocin [indomethacin] Other (See Comments)   Migraines    Linaclotide Diarrhea   Lisinopril Diarrhea, Other (See Comments)   Dizziness, Vomiting   Morphine Other (See Comments)   Chest Tightness   Morphine And Codeine Other (See Comments)   Chest tightness    Simvastatin Other (See Comments)   Muscle Pain   Sulfa Antibiotics Diarrhea   Wellbutrin [bupropion] Other (See Comments)   Jerking movement, slurred speech   Zetia [ezetimibe]    Diarrhea   Seroquel [quetiapine Fumarate] Palpitations        Medication List     TAKE these medications    acetaminophen 500 MG tablet Commonly known as: TYLENOL Take 1 tablet (500 mg total) by mouth every 6 (six) hours as needed.   apixaban 5 MG Tabs tablet Commonly known as: Eliquis Take 1 tablet (5 mg total) by mouth 2 (two) times daily.   baclofen 10 MG tablet Commonly known as: LIORESAL TAKE ONE TABLET BY MOUTH THREE TIMES DAILY AS NEEDED FOR MUSCLE SPASMS (VIAL)   blood glucose meter kit and supplies Kit Dispense based on patient and insurance preference. Use once daily fasting. Dx code E11.9.   cyanocobalamin 1000 MCG tablet Commonly known as: VITAMIN B12 Take 1,000 mcg by mouth daily. Gummy   dicyclomine 20 MG tablet Commonly known as: BENTYL TAKE ONE TABLET BY MOUTH THREE TIMES DAILY AS NEEDED FOR ABDOMINAL SPASMS (VIAL)   escitalopram 20 MG tablet Commonly known as: LEXAPRO TAKE ONE TABLET BY MOUTH DAILY AT 9AM   estradiol 0.1 MG/GM vaginal cream Commonly known as: ESTRACE Place vaginally at bedtime. 1  application placed on outside   famotidine 20 MG tablet Commonly known as: PEPCID Take 20 mg by mouth at bedtime.   ferrous sulfate 325 (65 FE) MG tablet Take 325 mg by mouth daily with breakfast.   fluticasone 50 MCG/ACT nasal spray Commonly known as: FLONASE SPRAY 2 SPRAYS INTO EACH NOSTRIL EVERY DAY   furosemide 20 MG tablet Commonly known as:  LASIX TAKE ONE TABLET BY MOUTH DAILY AS NEEDED FOR EDEMA (VIAL)   gabapentin 600 MG tablet Commonly known as: NEURONTIN TAKE ONE TABLET BY MOUTH DAILY AT 9AM and TAKE 1 AND 1/2 TABLETS BY MOUTH DAILY AT 9PM (VIAL) What changed:  how much to take how to take this when to take this   GHT Blood Glucose Monitor w/Device Kit DISPENSE BASED ON PATIENT AND INSURANCE PREFERENCE. USE ONCE DAILY AS DIRECTED. DX CODE E11.9.   isosorbide mononitrate 30 MG 24 hr tablet Commonly known as: IMDUR Take 0.5 tablets (15 mg total) by mouth daily.   Jardiance 25 MG Tabs tablet Generic drug: empagliflozin TAKE 1 TABLET (25 MG TOTAL) BY MOUTH DAILY.   levothyroxine 25 MCG tablet Commonly known as: SYNTHROID TAKE ONE TABLET BY MOUTH DAILY AT 7AM BEFORE BREAKFAST AND 2-3 HOURS BEFORE TAKING ANY OTHER MEDICATION OR SUPPLEMENT   Magnesium 200 MG Tabs Take 200 mg by mouth daily.   Multivitamin Gummies Adult Chew Chew 2 each by mouth daily.   nitroGLYCERIN 0.4 MG SL tablet Commonly known as: NITROSTAT Place 1 tablet (0.4 mg total) under the tongue every 5 (five) minutes as needed for chest pain.   OLANZapine 10 MG tablet Commonly known as: ZYPREXA TAKE 1 TABLET BY MOUTH EVERYDAY AT BEDTIME   ondansetron 4 MG disintegrating tablet Commonly known as: ZOFRAN-ODT Take 1 tablet (4 mg total) by mouth every 6 (six) hours as needed for nausea.   OVER THE COUNTER MEDICATION Take 2 each by mouth daily. Power C gummies   Ozempic (0.25 or 0.5 MG/DOSE) 2 MG/3ML Sopn Generic drug: Semaglutide(0.25 or 0.5MG /DOS) INJECT 0.5 MG INTO THE SKIN ONE TIME PER WEEK   pantoprazole 40 MG tablet Commonly known as: PROTONIX Take 1 tablet (40 mg total) by mouth daily.   potassium chloride SA 20 MEQ tablet Commonly known as: KLOR-CON M TAKE ONE TABLET BY MOUTH DAILY AS NEEDED TAKE WITH LASIX (VIAL)   Probiotic Blend Caps Take 1 capsule by mouth daily.   rosuvastatin 40 MG tablet Commonly known as: CRESTOR TAKE  1 TABLET BY MOUTH EVERY DAY What changed: when to take this   sucralfate 1 g tablet Commonly known as: CARAFATE TAKE 1 TABLET (1 G TOTAL) BY MOUTH 3 TIMES A DAY BEFORE MEALS What changed: See the new instructions.   traMADol 50 MG tablet Commonly known as: ULTRAM Take 1 tablet (50 mg total) by mouth every 6 (six) hours as needed for moderate pain (pain score 4-6).   triamcinolone cream 0.5 % Commonly known as: KENALOG Apply 1 Application topically 2 (two) times daily as needed (irritation).   ZINC GUMMY PO Take 2 each by mouth daily.         Signed: Mariam Dollar 08/03/2023, 8:48 AM

## 2023-08-03 NOTE — Plan of Care (Signed)

## 2023-08-04 DIAGNOSIS — R2681 Unsteadiness on feet: Secondary | ICD-10-CM | POA: Diagnosis not present

## 2023-08-04 DIAGNOSIS — N3000 Acute cystitis without hematuria: Secondary | ICD-10-CM | POA: Diagnosis not present

## 2023-08-04 DIAGNOSIS — E119 Type 2 diabetes mellitus without complications: Secondary | ICD-10-CM | POA: Diagnosis not present

## 2023-08-04 DIAGNOSIS — D649 Anemia, unspecified: Secondary | ICD-10-CM | POA: Diagnosis not present

## 2023-08-04 DIAGNOSIS — R278 Other lack of coordination: Secondary | ICD-10-CM | POA: Diagnosis not present

## 2023-08-04 DIAGNOSIS — M4316 Spondylolisthesis, lumbar region: Secondary | ICD-10-CM | POA: Diagnosis not present

## 2023-08-04 DIAGNOSIS — M48061 Spinal stenosis, lumbar region without neurogenic claudication: Secondary | ICD-10-CM | POA: Diagnosis not present

## 2023-08-04 DIAGNOSIS — Z9889 Other specified postprocedural states: Secondary | ICD-10-CM | POA: Diagnosis not present

## 2023-08-05 MED FILL — Heparin Sodium (Porcine) Inj 1000 Unit/ML: INTRAMUSCULAR | Qty: 30 | Status: AC

## 2023-08-05 MED FILL — Sodium Chloride IV Soln 0.9%: INTRAVENOUS | Qty: 2000 | Status: AC

## 2023-08-06 DIAGNOSIS — D649 Anemia, unspecified: Secondary | ICD-10-CM | POA: Diagnosis not present

## 2023-08-06 DIAGNOSIS — E119 Type 2 diabetes mellitus without complications: Secondary | ICD-10-CM | POA: Diagnosis not present

## 2023-08-06 DIAGNOSIS — N3000 Acute cystitis without hematuria: Secondary | ICD-10-CM | POA: Diagnosis not present

## 2023-08-06 DIAGNOSIS — M48061 Spinal stenosis, lumbar region without neurogenic claudication: Secondary | ICD-10-CM | POA: Diagnosis not present

## 2023-08-06 DIAGNOSIS — Z9889 Other specified postprocedural states: Secondary | ICD-10-CM | POA: Diagnosis not present

## 2023-08-06 DIAGNOSIS — E039 Hypothyroidism, unspecified: Secondary | ICD-10-CM | POA: Diagnosis not present

## 2023-08-07 DIAGNOSIS — M4316 Spondylolisthesis, lumbar region: Secondary | ICD-10-CM | POA: Diagnosis not present

## 2023-08-07 DIAGNOSIS — R2681 Unsteadiness on feet: Secondary | ICD-10-CM | POA: Diagnosis not present

## 2023-08-07 DIAGNOSIS — R278 Other lack of coordination: Secondary | ICD-10-CM | POA: Diagnosis not present

## 2023-08-10 DIAGNOSIS — E119 Type 2 diabetes mellitus without complications: Secondary | ICD-10-CM | POA: Diagnosis not present

## 2023-08-10 DIAGNOSIS — M48061 Spinal stenosis, lumbar region without neurogenic claudication: Secondary | ICD-10-CM | POA: Diagnosis not present

## 2023-08-10 DIAGNOSIS — Z9889 Other specified postprocedural states: Secondary | ICD-10-CM | POA: Diagnosis not present

## 2023-08-10 DIAGNOSIS — N3000 Acute cystitis without hematuria: Secondary | ICD-10-CM | POA: Diagnosis not present

## 2023-08-10 DIAGNOSIS — D649 Anemia, unspecified: Secondary | ICD-10-CM | POA: Diagnosis not present

## 2023-08-11 DIAGNOSIS — Z9889 Other specified postprocedural states: Secondary | ICD-10-CM | POA: Diagnosis not present

## 2023-08-11 DIAGNOSIS — K589 Irritable bowel syndrome without diarrhea: Secondary | ICD-10-CM | POA: Diagnosis not present

## 2023-08-11 DIAGNOSIS — N3000 Acute cystitis without hematuria: Secondary | ICD-10-CM | POA: Diagnosis not present

## 2023-08-11 DIAGNOSIS — I509 Heart failure, unspecified: Secondary | ICD-10-CM | POA: Diagnosis not present

## 2023-08-11 DIAGNOSIS — E119 Type 2 diabetes mellitus without complications: Secondary | ICD-10-CM | POA: Diagnosis not present

## 2023-08-11 DIAGNOSIS — I2510 Atherosclerotic heart disease of native coronary artery without angina pectoris: Secondary | ICD-10-CM | POA: Diagnosis not present

## 2023-08-11 DIAGNOSIS — D649 Anemia, unspecified: Secondary | ICD-10-CM | POA: Diagnosis not present

## 2023-08-11 DIAGNOSIS — M48061 Spinal stenosis, lumbar region without neurogenic claudication: Secondary | ICD-10-CM | POA: Diagnosis not present

## 2023-08-11 DIAGNOSIS — M4316 Spondylolisthesis, lumbar region: Secondary | ICD-10-CM | POA: Diagnosis not present

## 2023-08-11 DIAGNOSIS — I110 Hypertensive heart disease with heart failure: Secondary | ICD-10-CM | POA: Diagnosis not present

## 2023-08-11 DIAGNOSIS — R278 Other lack of coordination: Secondary | ICD-10-CM | POA: Diagnosis not present

## 2023-08-11 DIAGNOSIS — R2681 Unsteadiness on feet: Secondary | ICD-10-CM | POA: Diagnosis not present

## 2023-08-11 DIAGNOSIS — E039 Hypothyroidism, unspecified: Secondary | ICD-10-CM | POA: Diagnosis not present

## 2023-08-12 DIAGNOSIS — M48061 Spinal stenosis, lumbar region without neurogenic claudication: Secondary | ICD-10-CM | POA: Diagnosis not present

## 2023-08-12 DIAGNOSIS — N3000 Acute cystitis without hematuria: Secondary | ICD-10-CM | POA: Diagnosis not present

## 2023-08-12 DIAGNOSIS — Z9889 Other specified postprocedural states: Secondary | ICD-10-CM | POA: Diagnosis not present

## 2023-08-12 DIAGNOSIS — E119 Type 2 diabetes mellitus without complications: Secondary | ICD-10-CM | POA: Diagnosis not present

## 2023-08-12 DIAGNOSIS — D649 Anemia, unspecified: Secondary | ICD-10-CM | POA: Diagnosis not present

## 2023-08-14 DIAGNOSIS — K5900 Constipation, unspecified: Secondary | ICD-10-CM | POA: Diagnosis not present

## 2023-08-14 DIAGNOSIS — M48061 Spinal stenosis, lumbar region without neurogenic claudication: Secondary | ICD-10-CM | POA: Diagnosis not present

## 2023-08-14 DIAGNOSIS — E119 Type 2 diabetes mellitus without complications: Secondary | ICD-10-CM | POA: Diagnosis not present

## 2023-08-14 DIAGNOSIS — M4316 Spondylolisthesis, lumbar region: Secondary | ICD-10-CM | POA: Diagnosis not present

## 2023-08-14 DIAGNOSIS — D649 Anemia, unspecified: Secondary | ICD-10-CM | POA: Diagnosis not present

## 2023-08-14 DIAGNOSIS — N3000 Acute cystitis without hematuria: Secondary | ICD-10-CM | POA: Diagnosis not present

## 2023-08-14 DIAGNOSIS — Z9889 Other specified postprocedural states: Secondary | ICD-10-CM | POA: Diagnosis not present

## 2023-08-14 DIAGNOSIS — R2681 Unsteadiness on feet: Secondary | ICD-10-CM | POA: Diagnosis not present

## 2023-08-14 DIAGNOSIS — R278 Other lack of coordination: Secondary | ICD-10-CM | POA: Diagnosis not present

## 2023-08-16 IMAGING — DX DG ANKLE COMPLETE 3+V*R*
4 series · 4 of 4 positions shown · non-contrast
Comparison: None.

CLINICAL DATA: Lateral ankle pain

EXAM:
RIGHT ANKLE - COMPLETE 3+ VIEW

[ankle ap]
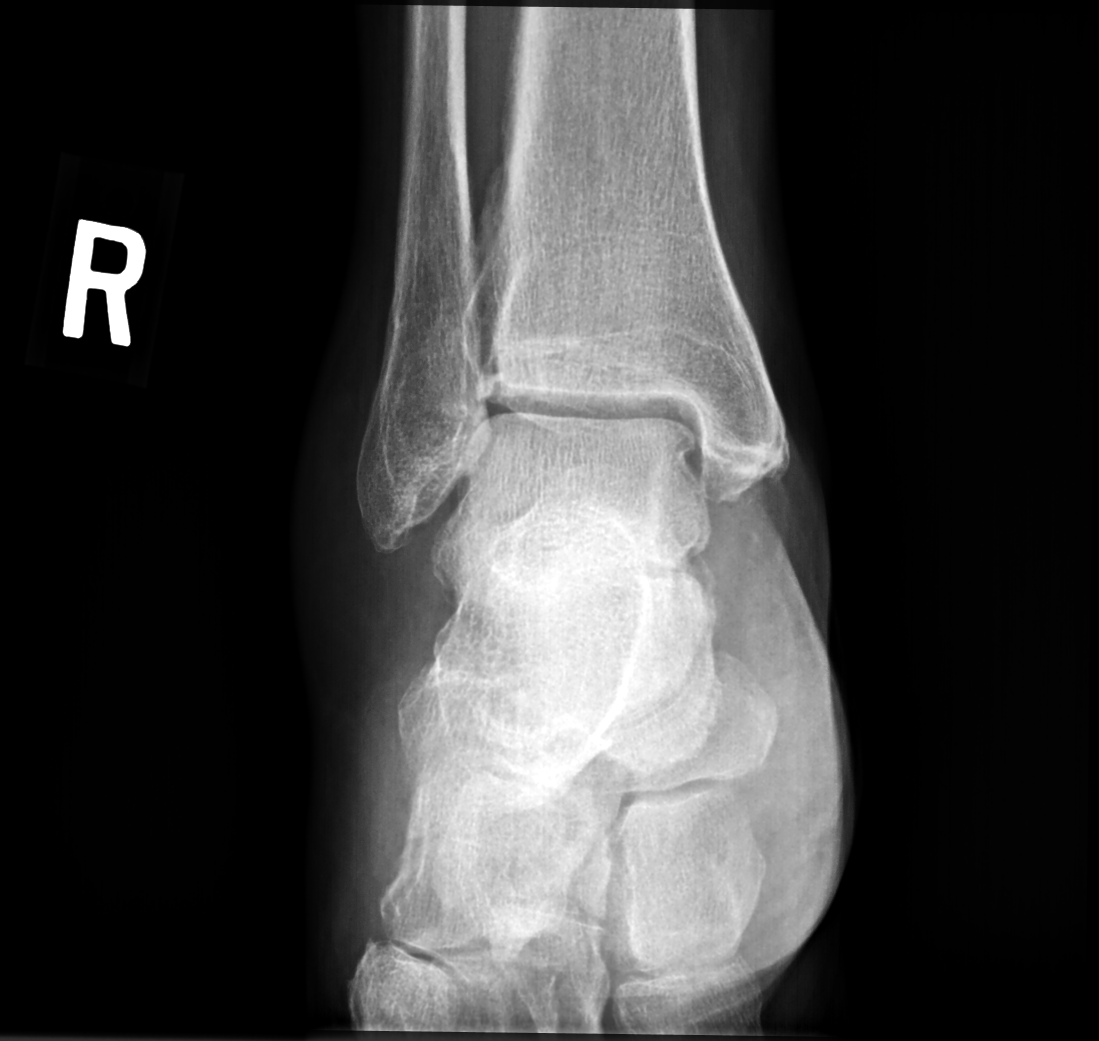

[ankle obl (oblique) (1 of 2)]
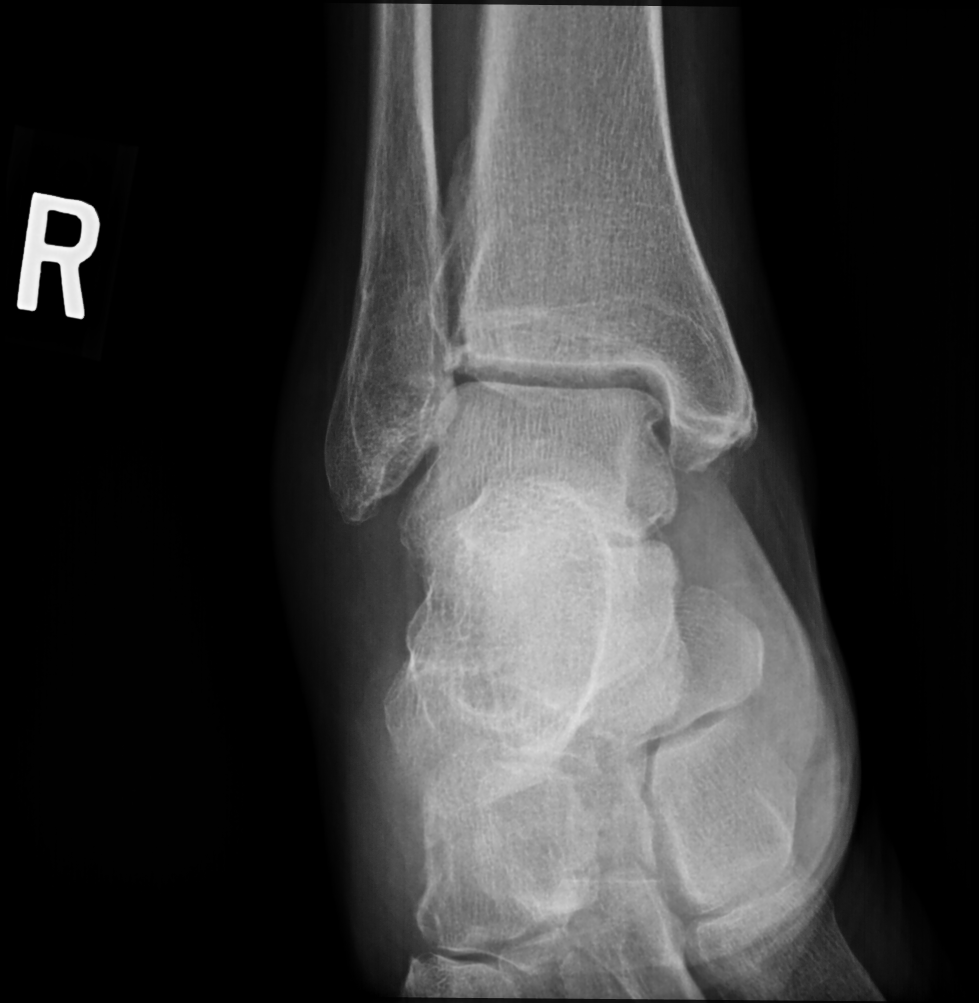

[ankle obl (oblique) (2 of 2)]
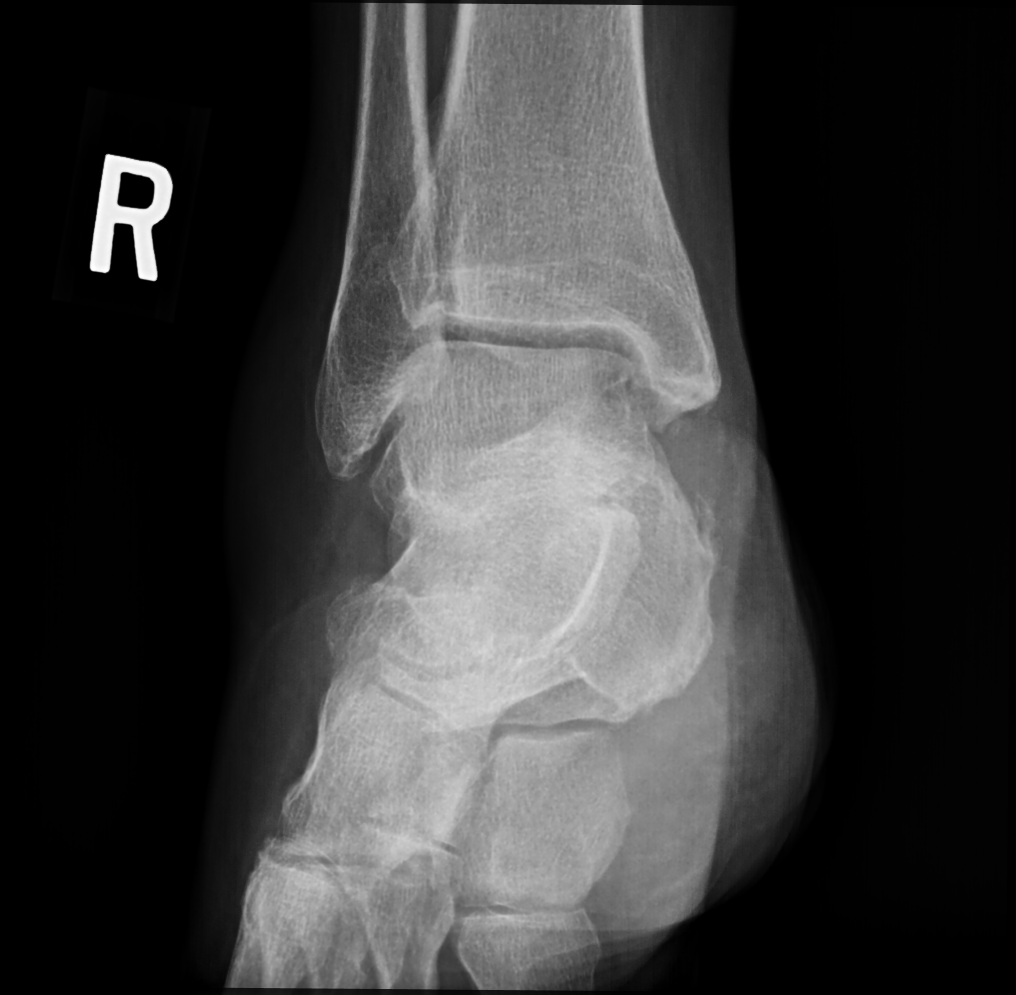

[ankle lat]
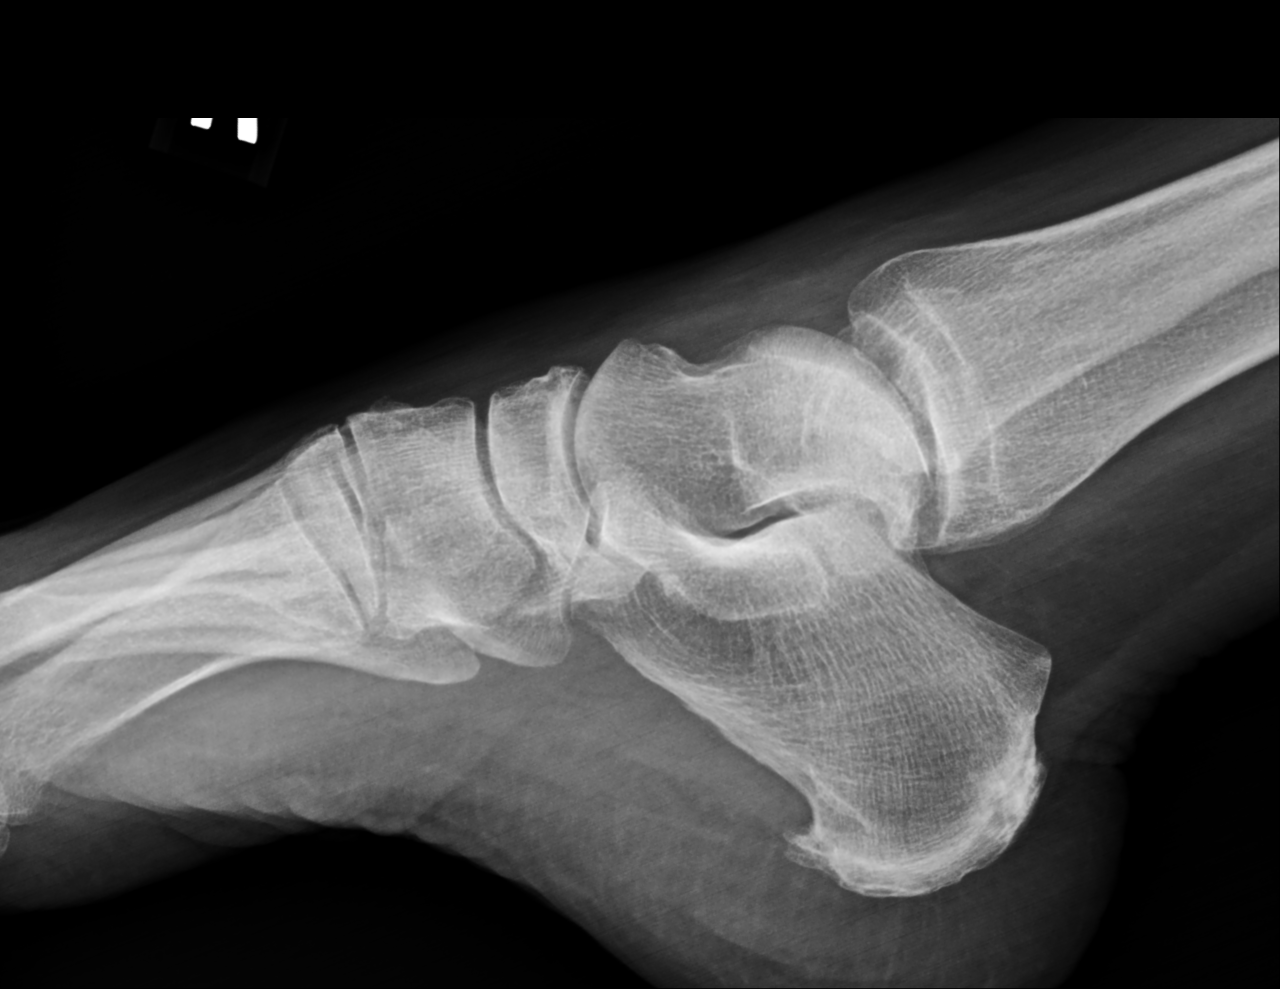

[4 of 4 positions shown; findings below may reference images not displayed]

FINDINGS: No fracture or malalignment. Ankle mortise is symmetric. Mild to
moderate medial and lateral degenerative changes. Small plantar
calcaneal spur. Generalized soft tissue edema
IMPRESSION: Degenerative changes and soft tissue swelling. No acute osseous
abnormality

## 2023-08-17 DIAGNOSIS — Z9889 Other specified postprocedural states: Secondary | ICD-10-CM | POA: Diagnosis not present

## 2023-08-17 DIAGNOSIS — N3000 Acute cystitis without hematuria: Secondary | ICD-10-CM | POA: Diagnosis not present

## 2023-08-17 DIAGNOSIS — I110 Hypertensive heart disease with heart failure: Secondary | ICD-10-CM | POA: Diagnosis not present

## 2023-08-17 DIAGNOSIS — M48061 Spinal stenosis, lumbar region without neurogenic claudication: Secondary | ICD-10-CM | POA: Diagnosis not present

## 2023-08-17 DIAGNOSIS — K589 Irritable bowel syndrome without diarrhea: Secondary | ICD-10-CM | POA: Diagnosis not present

## 2023-08-17 DIAGNOSIS — E039 Hypothyroidism, unspecified: Secondary | ICD-10-CM | POA: Diagnosis not present

## 2023-08-17 DIAGNOSIS — D649 Anemia, unspecified: Secondary | ICD-10-CM | POA: Diagnosis not present

## 2023-08-17 DIAGNOSIS — I509 Heart failure, unspecified: Secondary | ICD-10-CM | POA: Diagnosis not present

## 2023-08-17 DIAGNOSIS — I2510 Atherosclerotic heart disease of native coronary artery without angina pectoris: Secondary | ICD-10-CM | POA: Diagnosis not present

## 2023-08-17 DIAGNOSIS — E119 Type 2 diabetes mellitus without complications: Secondary | ICD-10-CM | POA: Diagnosis not present

## 2023-08-18 DIAGNOSIS — M4316 Spondylolisthesis, lumbar region: Secondary | ICD-10-CM | POA: Diagnosis not present

## 2023-08-18 DIAGNOSIS — R278 Other lack of coordination: Secondary | ICD-10-CM | POA: Diagnosis not present

## 2023-08-18 DIAGNOSIS — R2681 Unsteadiness on feet: Secondary | ICD-10-CM | POA: Diagnosis not present

## 2023-08-21 DIAGNOSIS — I509 Heart failure, unspecified: Secondary | ICD-10-CM | POA: Diagnosis not present

## 2023-08-21 DIAGNOSIS — Z9889 Other specified postprocedural states: Secondary | ICD-10-CM | POA: Diagnosis not present

## 2023-08-21 DIAGNOSIS — M48061 Spinal stenosis, lumbar region without neurogenic claudication: Secondary | ICD-10-CM | POA: Diagnosis not present

## 2023-08-21 DIAGNOSIS — E119 Type 2 diabetes mellitus without complications: Secondary | ICD-10-CM | POA: Diagnosis not present

## 2023-08-21 DIAGNOSIS — D649 Anemia, unspecified: Secondary | ICD-10-CM | POA: Diagnosis not present

## 2023-08-21 DIAGNOSIS — E039 Hypothyroidism, unspecified: Secondary | ICD-10-CM | POA: Diagnosis not present

## 2023-08-21 DIAGNOSIS — I2510 Atherosclerotic heart disease of native coronary artery without angina pectoris: Secondary | ICD-10-CM | POA: Diagnosis not present

## 2023-08-21 DIAGNOSIS — I110 Hypertensive heart disease with heart failure: Secondary | ICD-10-CM | POA: Diagnosis not present

## 2023-08-21 DIAGNOSIS — N3000 Acute cystitis without hematuria: Secondary | ICD-10-CM | POA: Diagnosis not present

## 2023-08-21 DIAGNOSIS — K589 Irritable bowel syndrome without diarrhea: Secondary | ICD-10-CM | POA: Diagnosis not present

## 2023-08-23 DIAGNOSIS — Z4789 Encounter for other orthopedic aftercare: Secondary | ICD-10-CM | POA: Diagnosis not present

## 2023-08-23 DIAGNOSIS — M4807 Spinal stenosis, lumbosacral region: Secondary | ICD-10-CM | POA: Diagnosis not present

## 2023-08-23 DIAGNOSIS — D649 Anemia, unspecified: Secondary | ICD-10-CM | POA: Diagnosis not present

## 2023-08-23 DIAGNOSIS — M4316 Spondylolisthesis, lumbar region: Secondary | ICD-10-CM | POA: Diagnosis not present

## 2023-08-23 DIAGNOSIS — N3000 Acute cystitis without hematuria: Secondary | ICD-10-CM | POA: Diagnosis not present

## 2023-08-23 DIAGNOSIS — I10 Essential (primary) hypertension: Secondary | ICD-10-CM | POA: Diagnosis not present

## 2023-08-24 DIAGNOSIS — N3000 Acute cystitis without hematuria: Secondary | ICD-10-CM | POA: Diagnosis not present

## 2023-08-25 ENCOUNTER — Telehealth: Payer: Self-pay

## 2023-08-25 DIAGNOSIS — N3000 Acute cystitis without hematuria: Secondary | ICD-10-CM | POA: Diagnosis not present

## 2023-08-25 DIAGNOSIS — M4807 Spinal stenosis, lumbosacral region: Secondary | ICD-10-CM | POA: Diagnosis not present

## 2023-08-25 DIAGNOSIS — I10 Essential (primary) hypertension: Secondary | ICD-10-CM | POA: Diagnosis not present

## 2023-08-25 DIAGNOSIS — D649 Anemia, unspecified: Secondary | ICD-10-CM | POA: Diagnosis not present

## 2023-08-25 DIAGNOSIS — M4316 Spondylolisthesis, lumbar region: Secondary | ICD-10-CM | POA: Diagnosis not present

## 2023-08-25 DIAGNOSIS — Z4789 Encounter for other orthopedic aftercare: Secondary | ICD-10-CM | POA: Diagnosis not present

## 2023-08-25 NOTE — Telephone Encounter (Signed)
Copied from CRM [id number]. Topic: Clinical - Home Health Verbal Orders >> Aug 25, 2023 12:54 PM Irine Seal wrote: Caller/Agency: [facility]- stephen s  Callback Number: [phone] Service Requested: Physical Therapy Frequency: 1 week, 4  Any new concerns about the patient? No

## 2023-08-25 NOTE — Telephone Encounter (Signed)
Verbal Order given for PT (Spoke with Jeannett Senior)

## 2023-08-26 NOTE — Telephone Encounter (Signed)
Noted. Agree with verbal order being given.

## 2023-08-27 ENCOUNTER — Telehealth: Payer: Self-pay

## 2023-08-27 DIAGNOSIS — M4807 Spinal stenosis, lumbosacral region: Secondary | ICD-10-CM | POA: Diagnosis not present

## 2023-08-27 DIAGNOSIS — I10 Essential (primary) hypertension: Secondary | ICD-10-CM | POA: Diagnosis not present

## 2023-08-27 DIAGNOSIS — N3000 Acute cystitis without hematuria: Secondary | ICD-10-CM | POA: Diagnosis not present

## 2023-08-27 DIAGNOSIS — M4316 Spondylolisthesis, lumbar region: Secondary | ICD-10-CM | POA: Diagnosis not present

## 2023-08-27 DIAGNOSIS — Z4789 Encounter for other orthopedic aftercare: Secondary | ICD-10-CM | POA: Diagnosis not present

## 2023-08-27 DIAGNOSIS — D649 Anemia, unspecified: Secondary | ICD-10-CM | POA: Diagnosis not present

## 2023-08-27 NOTE — Telephone Encounter (Signed)
Called and Valerie Vazquez stated she has already spoke to Valerie Vazquez, [place]

## 2023-08-27 NOTE — Telephone Encounter (Signed)
Copied from CRM [id number]. Topic: Clinical - Home Health Verbal Orders >> Aug 27, 2023 12:10 PM Gurney Maxin H wrote: Caller/Agency: Michelle/[facility] Callback Number: [phone] voicemail okay to leave message) Service Requested: Occupational Therapy Frequency: 1 time a week for 5 weeks Any new concerns about the patient? No

## 2023-08-27 NOTE — Telephone Encounter (Signed)
Called Michelle at [facility] and gave verbal orders for occupatiinal therapy 1 time a week for 5 weeks.

## 2023-09-02 IMAGING — MG MM DIGITAL SCREENING BILAT W/ TOMO AND CAD
8 series · 8 of 24 positions shown · non-contrast
Comparison: Previous exam(s).

CLINICAL DATA: Screening.

EXAM:
DIGITAL SCREENING BILATERAL MAMMOGRAM WITH TOMOSYNTHESIS AND CAD
TECHNIQUE: Bilateral screening digital craniocaudal and mediolateral oblique
mammograms were obtained. Bilateral screening digital breast
tomosynthesis was performed. The images were evaluated with
computer-aided detection.

[L CC synth-2D]
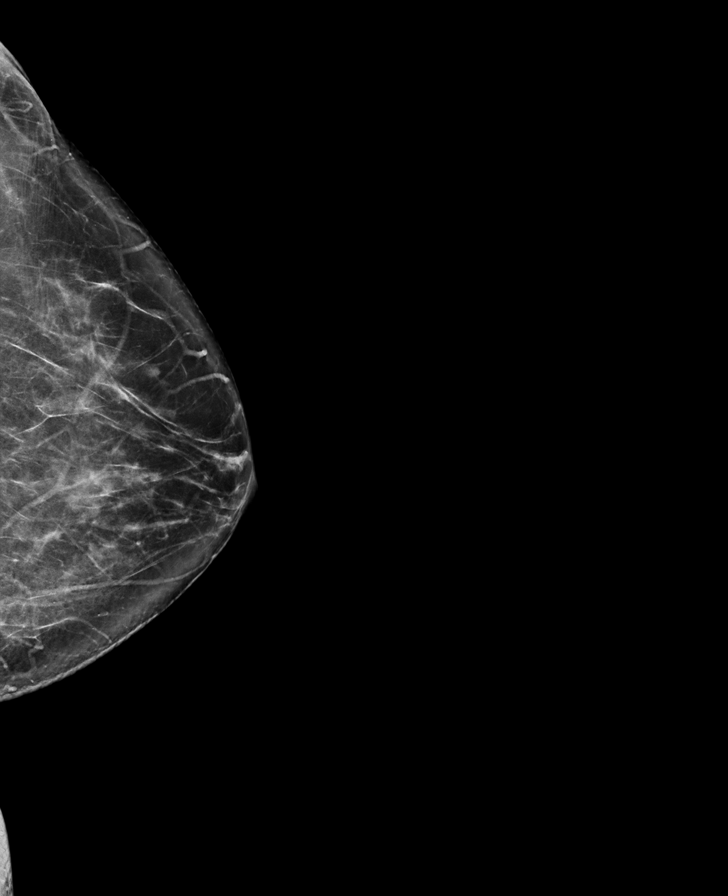

[R CC synth-2D]
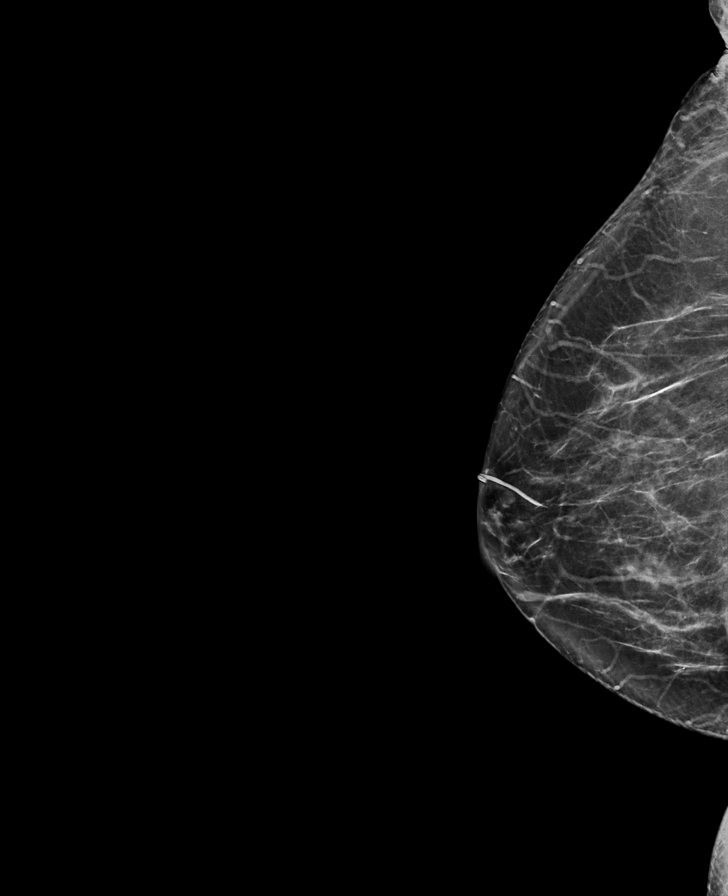

[R MLO synth-2D]
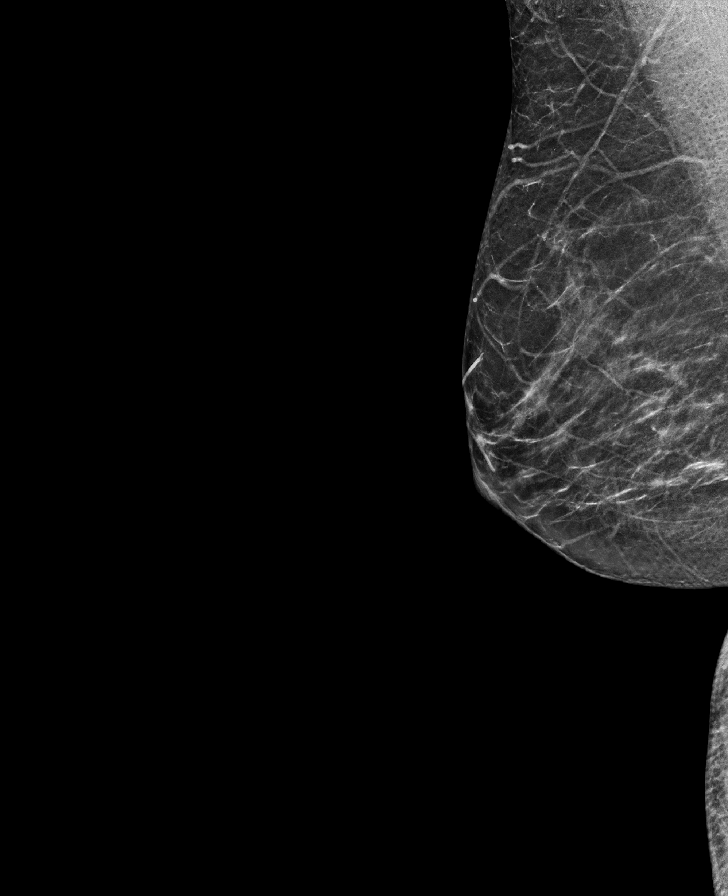

[L MLO synth-2D]
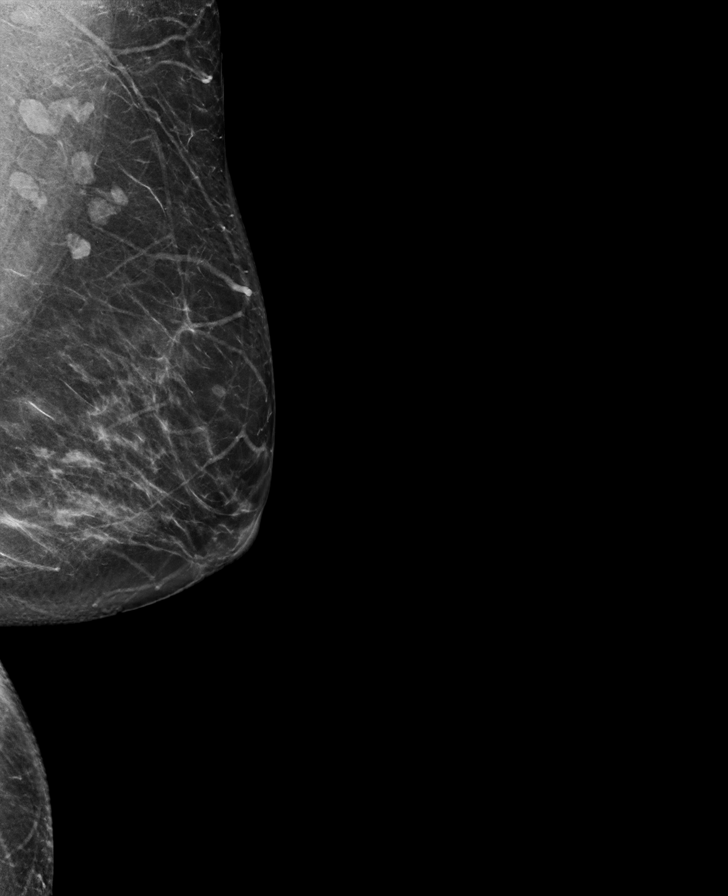

[R MLO tomo · tomo slice 32/63.0]
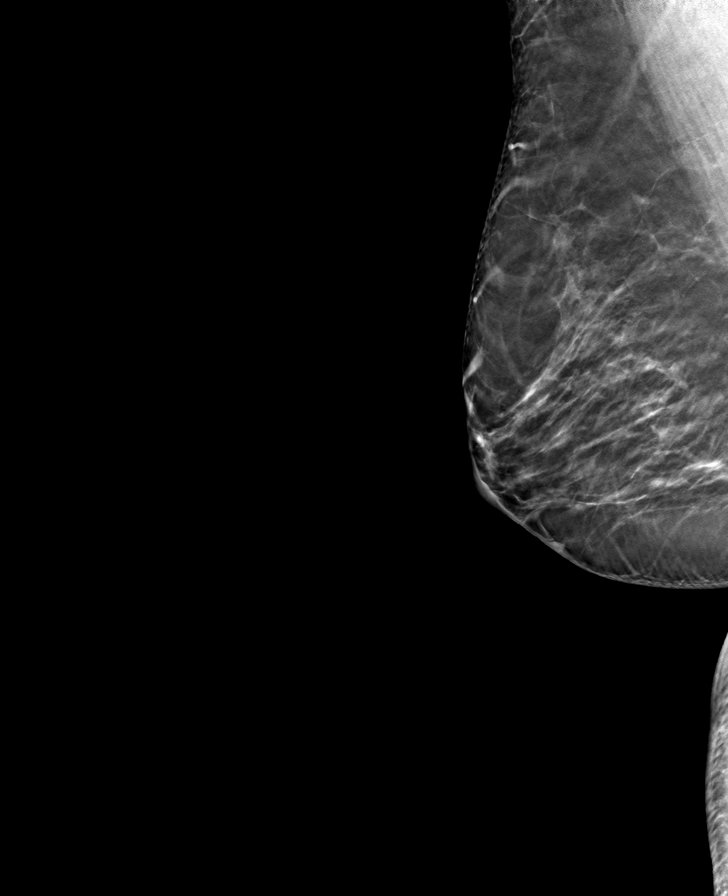

[L CC tomo · tomo slice 37/72.0]
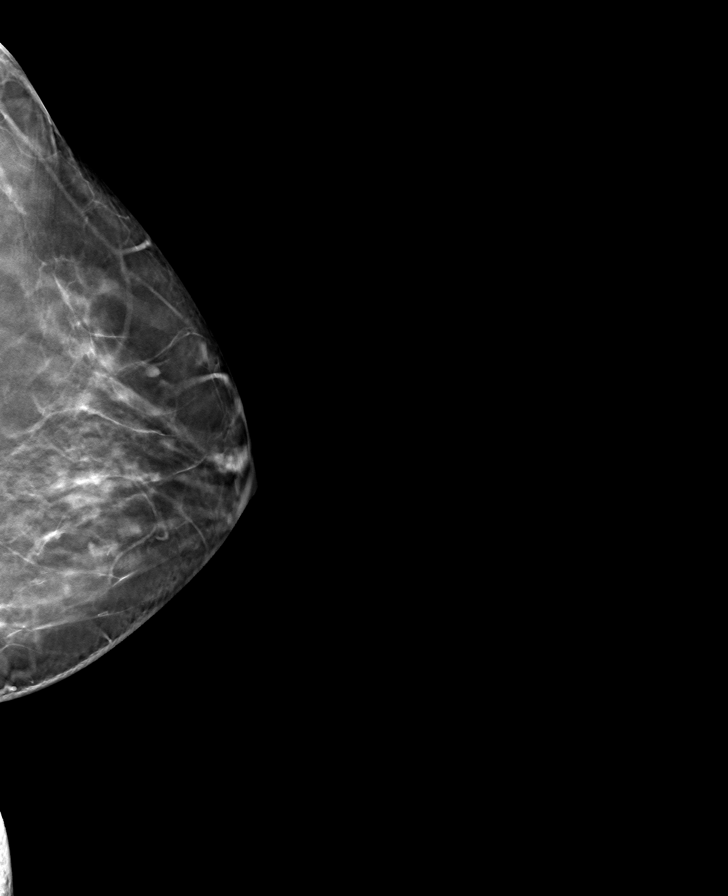

[L MLO tomo · tomo slice 36/71.0]
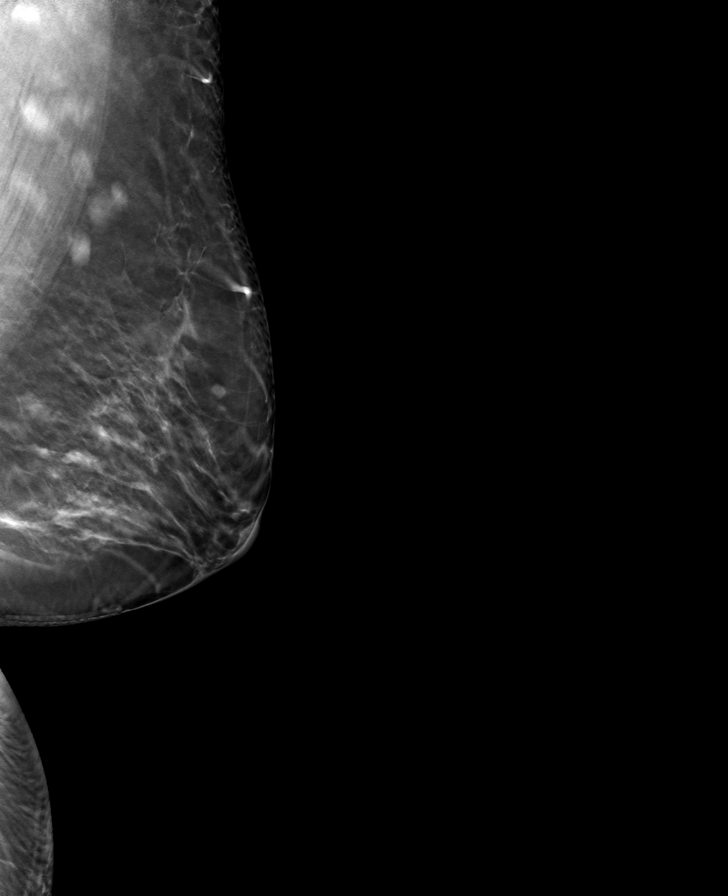

[R CC tomo · tomo slice 35/69.0]
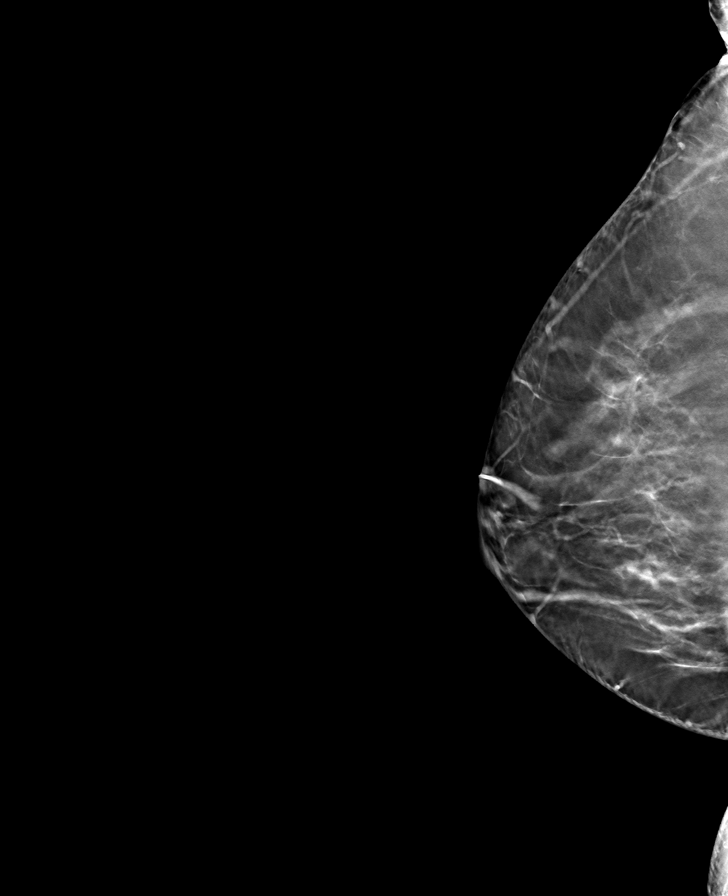

[8 of 24 positions shown; findings below may reference images not displayed]

ACR Breast Density Category b: There are scattered areas of
fibroglandular density.
FINDINGS: There are no findings suspicious for malignancy.
IMPRESSION: No mammographic evidence of malignancy. A result letter of this
screening mammogram will be mailed directly to the patient.

RECOMMENDATION:
Screening mammogram in one year. (Code:51-O-LD2)

BI-RADS CATEGORY  1: Negative.

## 2023-09-03 ENCOUNTER — Telehealth: Payer: Self-pay

## 2023-09-03 ENCOUNTER — Other Ambulatory Visit: Payer: Self-pay

## 2023-09-03 DIAGNOSIS — I110 Hypertensive heart disease with heart failure: Secondary | ICD-10-CM | POA: Diagnosis not present

## 2023-09-03 DIAGNOSIS — I509 Heart failure, unspecified: Secondary | ICD-10-CM | POA: Diagnosis not present

## 2023-09-03 DIAGNOSIS — Z4789 Encounter for other orthopedic aftercare: Secondary | ICD-10-CM | POA: Diagnosis not present

## 2023-09-03 DIAGNOSIS — E1140 Type 2 diabetes mellitus with diabetic neuropathy, unspecified: Secondary | ICD-10-CM | POA: Diagnosis not present

## 2023-09-03 DIAGNOSIS — E063 Autoimmune thyroiditis: Secondary | ICD-10-CM

## 2023-09-03 DIAGNOSIS — K5900 Constipation, unspecified: Secondary | ICD-10-CM | POA: Diagnosis not present

## 2023-09-03 DIAGNOSIS — D649 Anemia, unspecified: Secondary | ICD-10-CM | POA: Diagnosis not present

## 2023-09-03 MED ORDER — LEVOTHYROXINE SODIUM 25 MCG PO TABS: ORAL_TABLET | ORAL | 11 refills | Status: DC

## 2023-09-03 NOTE — Telephone Encounter (Signed)
 Received [facility] request for PA CMN. Paper work placed in folder to be signed

## 2023-09-03 NOTE — Telephone Encounter (Signed)
 Pt requesting, filled by  Donalee Citrin, MD Last attending

## 2023-09-03 NOTE — Telephone Encounter (Signed)
 Copied from CRM [id number]. Topic: Clinical - Medication Refill >> Sep 03, 2023 11:50 AM Leila BROCKS wrote: Most Recent Primary Care Visit:  Provider: ORLANDO KINGDOM  Department: [facility]  Visit Type: NURSE VISIT  Date: 06/29/2023  Medication: Levothyroxine  (SYNTHROID ) 25 MCG tablet almost out, 4 pills left and famotidine  (PEPCID ) 20 MG tablet, out of meds    Has the patient contacted their pharmacy? Yes (Agent: If no, request that the patient contact the pharmacy for the refill. If patient does not wish to contact the pharmacy document the reason why and proceed with request.) (Agent: If yes, when and what did the pharmacy advise?)  Is this the correct pharmacy for this prescription? Yes If no, delete pharmacy and type the correct one.  This is the patient's preferred pharmacy:  [id number] - [place], [place] - [address] [place] [address] Phone: [phone] Fax: [phone]   Has the prescription been filled recently?   Is the patient out of the medication? Yes  Has the patient been seen for an appointment in the last year OR does the patient have an upcoming appointment?   Can we respond through MyChart? No, please call [phone]   Agent: Please be advised that Rx refills may take up to 3 business days. We ask that you follow-up with your pharmacy.

## 2023-09-03 NOTE — Addendum Note (Signed)
 Addended by: Kristie Cowman on: 09/03/2023 12:37 PM   Modules accepted: Orders

## 2023-09-04 DIAGNOSIS — I110 Hypertensive heart disease with heart failure: Secondary | ICD-10-CM | POA: Diagnosis not present

## 2023-09-04 DIAGNOSIS — D649 Anemia, unspecified: Secondary | ICD-10-CM | POA: Diagnosis not present

## 2023-09-04 DIAGNOSIS — I509 Heart failure, unspecified: Secondary | ICD-10-CM | POA: Diagnosis not present

## 2023-09-04 DIAGNOSIS — E1140 Type 2 diabetes mellitus with diabetic neuropathy, unspecified: Secondary | ICD-10-CM | POA: Diagnosis not present

## 2023-09-04 DIAGNOSIS — Z4789 Encounter for other orthopedic aftercare: Secondary | ICD-10-CM | POA: Diagnosis not present

## 2023-09-04 DIAGNOSIS — K5900 Constipation, unspecified: Secondary | ICD-10-CM | POA: Diagnosis not present

## 2023-09-04 MED ORDER — FAMOTIDINE 20 MG PO TABS: 20.0000 mg | ORAL_TABLET | Freq: Every day | ORAL | 1 refills | Status: DC

## 2023-09-04 NOTE — Telephone Encounter (Signed)
 Please confirm what she needs a transfer bench for.

## 2023-09-04 NOTE — Telephone Encounter (Signed)
 Noted. Form signed and placed in signed folder.

## 2023-09-07 DIAGNOSIS — I509 Heart failure, unspecified: Secondary | ICD-10-CM | POA: Diagnosis not present

## 2023-09-07 DIAGNOSIS — K5900 Constipation, unspecified: Secondary | ICD-10-CM | POA: Diagnosis not present

## 2023-09-07 DIAGNOSIS — Z4789 Encounter for other orthopedic aftercare: Secondary | ICD-10-CM | POA: Diagnosis not present

## 2023-09-07 DIAGNOSIS — I110 Hypertensive heart disease with heart failure: Secondary | ICD-10-CM | POA: Diagnosis not present

## 2023-09-07 DIAGNOSIS — D649 Anemia, unspecified: Secondary | ICD-10-CM | POA: Diagnosis not present

## 2023-09-07 DIAGNOSIS — E1140 Type 2 diabetes mellitus with diabetic neuropathy, unspecified: Secondary | ICD-10-CM | POA: Diagnosis not present

## 2023-09-07 NOTE — Telephone Encounter (Signed)
 Paper work has been faxed

## 2023-09-10 DIAGNOSIS — I509 Heart failure, unspecified: Secondary | ICD-10-CM | POA: Diagnosis not present

## 2023-09-10 DIAGNOSIS — Z4789 Encounter for other orthopedic aftercare: Secondary | ICD-10-CM | POA: Diagnosis not present

## 2023-09-10 DIAGNOSIS — E1140 Type 2 diabetes mellitus with diabetic neuropathy, unspecified: Secondary | ICD-10-CM | POA: Diagnosis not present

## 2023-09-10 DIAGNOSIS — K5900 Constipation, unspecified: Secondary | ICD-10-CM | POA: Diagnosis not present

## 2023-09-10 DIAGNOSIS — I110 Hypertensive heart disease with heart failure: Secondary | ICD-10-CM | POA: Diagnosis not present

## 2023-09-10 DIAGNOSIS — D649 Anemia, unspecified: Secondary | ICD-10-CM | POA: Diagnosis not present

## 2023-09-11 ENCOUNTER — Telehealth: Payer: Self-pay

## 2023-09-11 DIAGNOSIS — K5900 Constipation, unspecified: Secondary | ICD-10-CM | POA: Diagnosis not present

## 2023-09-11 DIAGNOSIS — E1140 Type 2 diabetes mellitus with diabetic neuropathy, unspecified: Secondary | ICD-10-CM | POA: Diagnosis not present

## 2023-09-11 DIAGNOSIS — I110 Hypertensive heart disease with heart failure: Secondary | ICD-10-CM | POA: Diagnosis not present

## 2023-09-11 DIAGNOSIS — I509 Heart failure, unspecified: Secondary | ICD-10-CM | POA: Diagnosis not present

## 2023-09-11 DIAGNOSIS — Z4789 Encounter for other orthopedic aftercare: Secondary | ICD-10-CM | POA: Diagnosis not present

## 2023-09-11 DIAGNOSIS — D649 Anemia, unspecified: Secondary | ICD-10-CM | POA: Diagnosis not present

## 2023-09-11 NOTE — Telephone Encounter (Signed)
 Signed. Placed in signed folder.

## 2023-09-11 NOTE — Telephone Encounter (Signed)
 Plan of care orders were received needing provider signature forms placed in provider to be signed folder

## 2023-09-11 NOTE — Telephone Encounter (Signed)
 Forms faxed to the provided fax number [phone] with a completed transmission log

## 2023-09-14 DIAGNOSIS — E1140 Type 2 diabetes mellitus with diabetic neuropathy, unspecified: Secondary | ICD-10-CM | POA: Diagnosis not present

## 2023-09-14 DIAGNOSIS — Z4789 Encounter for other orthopedic aftercare: Secondary | ICD-10-CM | POA: Diagnosis not present

## 2023-09-14 DIAGNOSIS — I509 Heart failure, unspecified: Secondary | ICD-10-CM | POA: Diagnosis not present

## 2023-09-14 DIAGNOSIS — I110 Hypertensive heart disease with heart failure: Secondary | ICD-10-CM | POA: Diagnosis not present

## 2023-09-14 DIAGNOSIS — D649 Anemia, unspecified: Secondary | ICD-10-CM | POA: Diagnosis not present

## 2023-09-14 DIAGNOSIS — K5900 Constipation, unspecified: Secondary | ICD-10-CM | POA: Diagnosis not present

## 2023-09-15 ENCOUNTER — Telehealth: Payer: Self-pay

## 2023-09-15 DIAGNOSIS — M4316 Spondylolisthesis, lumbar region: Secondary | ICD-10-CM | POA: Diagnosis not present

## 2023-09-15 NOTE — Telephone Encounter (Signed)
 Copied from CRM [id number]. Topic: Clinical - Home Health Verbal Orders >> Sep 15, 2023 10:41 AM Joen NOVAK wrote: Caller/Agency: [facility]  Callback Number: [phone] Service Requested: Physical Therapy Frequency: 1 TIME FOR WEEK FOR 6 WEEKS. SHE WAS EVALUATED 2 WEEKS AGO AND DECLINED. ON 12/24 WAS HER FIRST VISIT AND WOULD NOT ALLOW THEM TO SEE HER  SHE WAS BASICALLY SEEN ONE TIME Any new concerns about the patient? Yes CHRIS FEELS THAT THE SERVICES ARE NEEDED BECAUSE SHE HAD SPINAL THERAPY

## 2023-09-16 NOTE — Telephone Encounter (Signed)
 Please call the patient and encourage her to allow PT to evaluate and treat her given she recently had surgery. If she declines then that is up to her.

## 2023-09-18 DIAGNOSIS — Z4789 Encounter for other orthopedic aftercare: Secondary | ICD-10-CM | POA: Diagnosis not present

## 2023-09-18 DIAGNOSIS — E1140 Type 2 diabetes mellitus with diabetic neuropathy, unspecified: Secondary | ICD-10-CM | POA: Diagnosis not present

## 2023-09-18 DIAGNOSIS — I509 Heart failure, unspecified: Secondary | ICD-10-CM | POA: Diagnosis not present

## 2023-09-18 DIAGNOSIS — K5900 Constipation, unspecified: Secondary | ICD-10-CM | POA: Diagnosis not present

## 2023-09-18 DIAGNOSIS — I110 Hypertensive heart disease with heart failure: Secondary | ICD-10-CM | POA: Diagnosis not present

## 2023-09-18 DIAGNOSIS — D649 Anemia, unspecified: Secondary | ICD-10-CM | POA: Diagnosis not present

## 2023-09-21 ENCOUNTER — Encounter: Payer: Self-pay | Admitting: Family Medicine

## 2023-09-21 ENCOUNTER — Ambulatory Visit (INDEPENDENT_AMBULATORY_CARE_PROVIDER_SITE_OTHER): Payer: 59 | Admitting: Family Medicine

## 2023-09-21 VITALS — BP 116/62 | HR 105 | Ht 66.0 in | Wt 191.4 lb

## 2023-09-21 DIAGNOSIS — E119 Type 2 diabetes mellitus without complications: Secondary | ICD-10-CM | POA: Diagnosis not present

## 2023-09-21 DIAGNOSIS — I110 Hypertensive heart disease with heart failure: Secondary | ICD-10-CM | POA: Diagnosis not present

## 2023-09-21 DIAGNOSIS — K5900 Constipation, unspecified: Secondary | ICD-10-CM | POA: Diagnosis not present

## 2023-09-21 DIAGNOSIS — Z86711 Personal history of pulmonary embolism: Secondary | ICD-10-CM

## 2023-09-21 DIAGNOSIS — D649 Anemia, unspecified: Secondary | ICD-10-CM | POA: Diagnosis not present

## 2023-09-21 DIAGNOSIS — M545 Low back pain, unspecified: Secondary | ICD-10-CM | POA: Diagnosis not present

## 2023-09-21 DIAGNOSIS — G8929 Other chronic pain: Secondary | ICD-10-CM

## 2023-09-21 DIAGNOSIS — I509 Heart failure, unspecified: Secondary | ICD-10-CM | POA: Diagnosis not present

## 2023-09-21 DIAGNOSIS — Z7985 Long-term (current) use of injectable non-insulin antidiabetic drugs: Secondary | ICD-10-CM

## 2023-09-21 DIAGNOSIS — E1140 Type 2 diabetes mellitus with diabetic neuropathy, unspecified: Secondary | ICD-10-CM | POA: Diagnosis not present

## 2023-09-21 DIAGNOSIS — Z4789 Encounter for other orthopedic aftercare: Secondary | ICD-10-CM | POA: Diagnosis not present

## 2023-09-21 DIAGNOSIS — Z7984 Long term (current) use of oral hypoglycemic drugs: Secondary | ICD-10-CM

## 2023-09-21 DIAGNOSIS — N644 Mastodynia: Secondary | ICD-10-CM | POA: Diagnosis not present

## 2023-09-21 LAB — CBC
HCT: 39.8 % (ref 36.0–46.0)
Hemoglobin: 13 g/dL (ref 12.0–15.0)
MCHC: 32.6 g/dL (ref 30.0–36.0)
MCV: 95.7 fL (ref 78.0–100.0)
Platelets: 317 10*3/uL (ref 150.0–400.0)
RBC: 4.15 Mil/uL (ref 3.87–5.11)
RDW: 14.9 % (ref 11.5–15.5)
WBC: 7.8 10*3/uL (ref 4.0–10.5)

## 2023-09-21 LAB — MICROALBUMIN / CREATININE URINE RATIO
Creatinine,U: 104.3 mg/dL
Microalb Creat Ratio: 0.7 mg/g (ref 0.0–30.0)
Microalb, Ur: <0.7 mg/dL (ref 0.0–1.9)

## 2023-09-21 NOTE — Patient Instructions (Signed)
Nice to see you. Please call [phone] to schedule your mammogram.

## 2023-09-21 NOTE — Assessment & Plan Note (Signed)
Chronic issue.  Status post surgical intervention.  She will reach out to her neurosurgeon to get their input on outpatient physical therapy.  She can continue over-the-counter Tylenol for her discomfort.

## 2023-09-21 NOTE — Assessment & Plan Note (Signed)
Suspect patient's symptoms are more related to a musculoskeletal issue though given the location of her discomfort we will get a diagnostic mammogram and ultrasound to evaluate further.  Patient was advised to contact us if nobody contacts her to schedule this in the next several weeks.

## 2023-09-21 NOTE — Progress Notes (Signed)
[name], MD Phone: [phone]  Valerie Vazquez is a 68 y.o. female who presents today for f/u.  DIABETES Disease Monitoring: Blood Sugar ranges-90s-120s Polyuria/phagia/dipsia- no      Optho- due Medications: Compliance- taking jardiance, ozempic Hypoglycemic symptoms- no  Low back pain: That is post surgical intervention.  She notes the surgery went okay.  She has been on Tylenol for discomfort.  She notes home health physical therapy discharged her and encouraged her to get set up with outpatient physical therapy.  History of PE: Patient remains on Eliquis.  No bleeding.  No chest pain or shortness of breath.  Left chest wall/breast pain: Patient notes this has been going on recently.  Notes a discomfort in her left lateral chest wall/breast.   Social History   Tobacco Use  Smoking Status Former   Current packs/day: 0.00   Average packs/day: 1.5 packs/day for 10.0 years (15.0 ttl pk-yrs)   Types: Cigarettes   Start date: 04/11/1969   Quit date: 04/12/1979   Years since quitting: 44.4  Smokeless Tobacco Never    Current Outpatient Medications on File Prior to Visit  Medication Sig Dispense Refill   acetaminophen (TYLENOL) 500 MG tablet Take 1 tablet (500 mg total) by mouth every 6 (six) hours as needed. 30 tablet 0   apixaban (ELIQUIS) 5 MG TABS tablet Take 1 tablet (5 mg total) by mouth 2 (two) times daily. 180 tablet 1   baclofen (LIORESAL) 10 MG tablet TAKE ONE TABLET BY MOUTH THREE TIMES DAILY AS NEEDED FOR MUSCLE SPASMS (VIAL) 60 tablet 11   blood glucose meter kit and supplies KIT Dispense based on patient and insurance preference. Use once daily fasting. Dx code E11.9. 1 each 0   Blood Glucose Monitoring Suppl (GHT BLOOD GLUCOSE MONITOR) w/Device KIT DISPENSE BASED ON PATIENT AND INSURANCE PREFERENCE. USE ONCE DAILY AS DIRECTED. DX CODE E11.9. 1 kit 0   cyanocobalamin (VITAMIN B12) 1000 MCG tablet Take 1,000 mcg by mouth daily. Gummy     dicyclomine (BENTYL)  20 MG tablet TAKE ONE TABLET BY MOUTH THREE TIMES DAILY AS NEEDED FOR ABDOMINAL SPASMS (VIAL) 270 tablet 0   escitalopram (LEXAPRO) 20 MG tablet TAKE ONE TABLET BY MOUTH DAILY AT 9AM 90 tablet 0   famotidine (PEPCID) 20 MG tablet Take 1 tablet (20 mg total) by mouth at bedtime. 30 tablet 1   ferrous sulfate 325 (65 FE) MG tablet Take 325 mg by mouth daily with breakfast.     fluticasone (FLONASE) 50 MCG/ACT nasal spray SPRAY 2 SPRAYS INTO EACH NOSTRIL EVERY DAY 48 mL 2   furosemide (LASIX) 20 MG tablet TAKE ONE TABLET BY MOUTH DAILY AS NEEDED FOR EDEMA (VIAL) 90 tablet 3   gabapentin (NEURONTIN) 600 MG tablet TAKE ONE TABLET BY MOUTH DAILY AT 9AM and TAKE 1 AND 1/2 TABLETS BY MOUTH DAILY AT 9PM (VIAL) (Patient taking differently: Take 600-900 mg by mouth See admin instructions. TAKE ONE TABLET BY MOUTH DAILY AT 9AM and TAKE 1 AND 1/2 TABLETS BY MOUTH DAILY AT 9PM (VIAL)) 225 tablet 11   isosorbide mononitrate (IMDUR) 30 MG 24 hr tablet Take 0.5 tablets (15 mg total) by mouth daily.     JARDIANCE 25 MG TABS tablet TAKE 1 TABLET (25 MG TOTAL) BY MOUTH DAILY. 90 tablet 3   levothyroxine (SYNTHROID) 25 MCG tablet TAKE ONE TABLET BY MOUTH DAILY AT 7AM BEFORE BREAKFAST AND 2-3 HOURS BEFORE TAKING ANY OTHER MEDICATION OR SUPPLEMENT 90 tablet 11   Magnesium 200 MG  TABS Take 200 mg by mouth daily.     Multiple Vitamins-Minerals (MULTIVITAMIN GUMMIES ADULT) CHEW Chew 2 each by mouth daily.     nitroGLYCERIN (NITROSTAT) 0.4 MG SL tablet Place 1 tablet (0.4 mg total) under the tongue every 5 (five) minutes as needed for chest pain. 25 tablet 3   OLANZapine (ZYPREXA) 10 MG tablet TAKE 1 TABLET BY MOUTH EVERYDAY AT BEDTIME 90 tablet 0   OVER THE COUNTER MEDICATION Take 2 each by mouth daily. Power C gummies     pantoprazole (PROTONIX) 40 MG tablet Take 1 tablet (40 mg total) by mouth daily. 90 tablet 1   potassium chloride SA (KLOR-CON M) 20 MEQ tablet TAKE ONE TABLET BY MOUTH DAILY AS NEEDED TAKE WITH LASIX  (VIAL) 90 tablet 11   Probiotic Product (PROBIOTIC BLEND) CAPS Take 1 capsule by mouth daily.     rosuvastatin (CRESTOR) 40 MG tablet TAKE 1 TABLET BY MOUTH EVERY DAY (Patient taking differently: Take 40 mg by mouth at bedtime.) 90 tablet 1   Semaglutide,0.25 or 0.5MG /DOS, (OZEMPIC, 0.25 OR 0.5 MG/DOSE,) 2 MG/3ML SOPN INJECT 0.5 MG INTO THE SKIN ONE TIME PER WEEK 3 mL 2   sucralfate (CARAFATE) 1 g tablet TAKE 1 TABLET (1 G TOTAL) BY MOUTH 3 TIMES A DAY BEFORE MEALS 90 tablet 2   triamcinolone cream (KENALOG) 0.5 % Apply 1 Application topically 2 (two) times daily as needed (irritation).     Zinc Citrate (ZINC GUMMY PO) Take 2 each by mouth daily.     estradiol (ESTRACE) 0.1 MG/GM vaginal cream Place vaginally at bedtime. 1 application placed on outside (Patient not taking: Reported on 09/21/2023)     traMADol (ULTRAM) 50 MG tablet Take 1 tablet (50 mg total) by mouth every 6 (six) hours as needed for moderate pain (pain score 4-6). (Patient not taking: Reported on 09/21/2023) 30 tablet 0   No current facility-administered medications on file prior to visit.     ROS see history of present illness  Objective  Physical Exam Vitals:   09/21/23 1109  BP: 116/62  Pulse: (!) 105  SpO2: 96%    BP Readings from Last 3 Encounters:  09/21/23 116/62  08/03/23 115/65  07/22/23 121/74   Wt Readings from Last 3 Encounters:  09/21/23 191 lb 6.4 oz (86.8 kg)  07/27/23 195 lb (88.5 kg)  07/22/23 192 lb 4.8 oz (87.2 kg)    Physical Exam Constitutional:      General: She is not in acute distress.    Appearance: She is not diaphoretic.  Cardiovascular:     Rate and Rhythm: Normal rate and regular rhythm.     Heart sounds: Normal heart sounds.  Pulmonary:     Effort: Pulmonary effort is normal.     Breath sounds: Normal breath sounds.  Chest:    Skin:    General: Skin is warm and dry.     Comments: Well-healed lumbar midline incision  Neurological:     Mental Status: She is alert.       Assessment/Plan: Please see individual problem list.  Diabetes mellitus without complication (HCC) Assessment & Plan: Chronic issue.  Extremely well-controlled on most recent A1c.  She will continue Jardiance 25 mg daily and Ozempic 0.5 mg weekly.  Orders: -     Microalbumin / creatinine urine ratio -     Ambulatory referral to Ophthalmology  History of pulmonary embolus (PE) Assessment & Plan: Chronic issue.  Patient will continue Eliquis 5 mg twice daily.  Check CBC.  Orders: -     CBC  Chronic bilateral low back pain without sciatica Assessment & Plan: Chronic issue.  Status post surgical intervention.  She will reach out to her neurosurgeon to get their input on outpatient physical therapy.  She can continue over-the-counter Tylenol for her discomfort.   Breast pain Assessment & Plan: Suspect patient's symptoms are more related to a musculoskeletal issue though given the location of her discomfort we will get a diagnostic mammogram and ultrasound to evaluate further.  Patient was advised to contact us if nobody contacts her to schedule this in the next several weeks.  Orders: -     MM 3D DIAGNOSTIC MAMMOGRAM BILATERAL BREAST; Future -     [place] LIMITED ULTRASOUND INCLUDING AXILLA LEFT BREAST ; Future -     [place] LIMITED ULTRASOUND INCLUDING AXILLA RIGHT BREAST; Future    Return in about 3 months (around 12/20/2023) for Transfer of care to Dr. Clent Ridges.   [name], MD [facility]

## 2023-09-21 NOTE — Telephone Encounter (Signed)
Discussed during visit.  Patient is to call her neurosurgeon to get this set up.

## 2023-09-21 NOTE — Telephone Encounter (Signed)
Pt stated during her office visit that she has been getting in home PT but was signed off but advised that she needs more outpatient PT.

## 2023-09-21 NOTE — Assessment & Plan Note (Signed)
Chronic issue.  Patient will continue Eliquis 5 mg twice daily.  Check CBC.

## 2023-09-21 NOTE — Assessment & Plan Note (Signed)
Chronic issue.  Extremely well-controlled on most recent A1c.  She will continue Jardiance 25 mg daily and Ozempic 0.5 mg weekly.

## 2023-09-26 ENCOUNTER — Other Ambulatory Visit: Payer: Self-pay | Admitting: Family Medicine

## 2023-09-29 ENCOUNTER — Ambulatory Visit
Admission: RE | Admit: 2023-09-29 | Discharge: 2023-09-29 | Disposition: A | Discharge status: Home / Self Care | Payer: 59 | Source: Ambulatory Visit | Attending: Family Medicine | Admitting: Family Medicine

## 2023-09-29 DIAGNOSIS — N6325 Unspecified lump in the left breast, overlapping quadrants: Secondary | ICD-10-CM | POA: Diagnosis not present

## 2023-09-29 DIAGNOSIS — N644 Mastodynia: Secondary | ICD-10-CM

## 2023-09-29 DIAGNOSIS — M79622 Pain in left upper arm: Secondary | ICD-10-CM | POA: Diagnosis not present

## 2023-09-29 DIAGNOSIS — R92323 Mammographic fibroglandular density, bilateral breasts: Secondary | ICD-10-CM | POA: Diagnosis not present

## 2023-10-03 DIAGNOSIS — N3000 Acute cystitis without hematuria: Secondary | ICD-10-CM | POA: Diagnosis not present

## 2023-10-05 DIAGNOSIS — E119 Type 2 diabetes mellitus without complications: Secondary | ICD-10-CM | POA: Diagnosis not present

## 2023-10-05 DIAGNOSIS — E039 Hypothyroidism, unspecified: Secondary | ICD-10-CM | POA: Diagnosis not present

## 2023-10-07 ENCOUNTER — Telehealth: Payer: Self-pay

## 2023-10-07 NOTE — Telephone Encounter (Signed)
 Received discharge orders from [facility]. Orders signed and faxed

## 2023-10-08 ENCOUNTER — Emergency Department
Admission: EM | Admit: 2023-10-08 | Discharge: 2023-10-08 | Disposition: A | Discharge status: Home / Self Care | Payer: 59 | Attending: Emergency Medicine | Admitting: Emergency Medicine

## 2023-10-08 ENCOUNTER — Emergency Department: Payer: 59

## 2023-10-08 ENCOUNTER — Other Ambulatory Visit: Payer: Self-pay

## 2023-10-08 DIAGNOSIS — J101 Influenza due to other identified influenza virus with other respiratory manifestations: Secondary | ICD-10-CM | POA: Insufficient documentation

## 2023-10-08 DIAGNOSIS — R3915 Urgency of urination: Secondary | ICD-10-CM | POA: Diagnosis not present

## 2023-10-08 DIAGNOSIS — I2510 Atherosclerotic heart disease of native coronary artery without angina pectoris: Secondary | ICD-10-CM | POA: Insufficient documentation

## 2023-10-08 DIAGNOSIS — Z20822 Contact with and (suspected) exposure to covid-19: Secondary | ICD-10-CM | POA: Insufficient documentation

## 2023-10-08 DIAGNOSIS — E119 Type 2 diabetes mellitus without complications: Secondary | ICD-10-CM | POA: Diagnosis not present

## 2023-10-08 DIAGNOSIS — I10 Essential (primary) hypertension: Secondary | ICD-10-CM | POA: Insufficient documentation

## 2023-10-08 DIAGNOSIS — R059 Cough, unspecified: Secondary | ICD-10-CM | POA: Diagnosis present

## 2023-10-08 DIAGNOSIS — R918 Other nonspecific abnormal finding of lung field: Secondary | ICD-10-CM | POA: Diagnosis not present

## 2023-10-08 LAB — CBC
HCT: 37.1 % (ref 36.0–46.0)
Hemoglobin: 12.5 g/dL (ref 12.0–15.0)
MCH: 31.6 pg (ref 26.0–34.0)
MCHC: 33.7 g/dL (ref 30.0–36.0)
MCV: 93.7 fL (ref 80.0–100.0)
Platelets: 259 10*3/uL (ref 150–400)
RBC: 3.96 MIL/uL (ref 3.87–5.11)
RDW: 14.7 % (ref 11.5–15.5)
WBC: 7.4 10*3/uL (ref 4.0–10.5)
nRBC: 0 % (ref 0.0–0.2)

## 2023-10-08 LAB — RESP PANEL BY RT-PCR (RSV, FLU A&B, COVID)  RVPGX2
Influenza A by PCR: POSITIVE — AB
Influenza B by PCR: NEGATIVE
Resp Syncytial Virus by PCR: NEGATIVE
SARS Coronavirus 2 by RT PCR: NEGATIVE

## 2023-10-08 LAB — URINALYSIS, W/ REFLEX TO CULTURE (INFECTION SUSPECTED)
Bilirubin Urine: NEGATIVE
Glucose, UA: >=500 mg/dL — AB
Hgb urine dipstick: NEGATIVE
Ketones, ur: NEGATIVE mg/dL
Nitrite: NEGATIVE
Protein, ur: NEGATIVE mg/dL
Specific Gravity, Urine: 1.017 (ref 1.005–1.030)
pH: 5 (ref 5.0–8.0)

## 2023-10-08 LAB — HEPATIC FUNCTION PANEL
ALT: 24 U/L (ref 0–44)
AST: 31 U/L (ref 15–41)
Albumin: 3.7 g/dL (ref 3.5–5.0)
Alkaline Phosphatase: 93 U/L (ref 38–126)
Bilirubin, Direct: 0.2 mg/dL (ref 0.0–0.2)
Indirect Bilirubin: 0.6 mg/dL (ref 0.3–0.9)
Total Bilirubin: 0.8 mg/dL (ref 0.0–1.2)
Total Protein: 7.4 g/dL (ref 6.5–8.1)

## 2023-10-08 LAB — BASIC METABOLIC PANEL
Anion gap: 11 (ref 5–15)
BUN: 14 mg/dL (ref 8–23)
CO2: 22 mmol/L (ref 22–32)
Calcium: 9.2 mg/dL (ref 8.9–10.3)
Chloride: 104 mmol/L (ref 98–111)
Creatinine, Ser: 1.29 mg/dL — ABNORMAL HIGH (ref 0.44–1.00)
GFR, Estimated: 45 mL/min — ABNORMAL LOW (ref >60)
Glucose, Bld: 165 mg/dL — ABNORMAL HIGH (ref 70–99)
Potassium: 4 mmol/L (ref 3.5–5.1)
Sodium: 137 mmol/L (ref 135–145)

## 2023-10-08 LAB — LACTIC ACID, PLASMA
Lactic Acid, Venous: 2.4 mmol/L (ref 0.5–1.9)
Lactic Acid, Venous: 1.8 mmol/L (ref 0.5–1.9)

## 2023-10-08 LAB — CBG MONITORING, ED: Glucose-Capillary: 191 mg/dL — ABNORMAL HIGH (ref 70–99)

## 2023-10-08 MED ORDER — LEVOFLOXACIN IN D5W 750 MG/150ML IV SOLN
750.0000 mg | Freq: Once | INTRAVENOUS | Status: AC
  Administered 2023-10-08: 750 mg via INTRAVENOUS
  Filled 2023-10-08: qty 150

## 2023-10-08 MED ORDER — SODIUM CHLORIDE 0.9 % IV BOLUS (SEPSIS)
2000.0000 mL | Freq: Once | INTRAVENOUS | Status: AC
  Administered 2023-10-08: 2000 mL via INTRAVENOUS

## 2023-10-08 NOTE — Consult Note (Signed)
 CODE SEPSIS - PHARMACY COMMUNICATION  **Broad Spectrum Antibiotics should be administered within 1 hour of Sepsis diagnosis**  Time Code Sepsis Called/Page Received: 1204  Antibiotics Ordered: levaquin   Time of 1st antibiotic administration: 1251  Additional action taken by pharmacy: none  If necessary, Name of Provider/Nurse Contacted: n/a   Giovannie Scerbo Rodriguez-Guzman PharmD, BCPS 10/08/2023 12:05 PM

## 2023-10-08 NOTE — ED Notes (Signed)
 Upon getting from wheelchair to bed pt reports dizziness and generally feeling unwell

## 2023-10-08 NOTE — Sepsis Progress Note (Signed)
 Sepsis protocol monitored by eLink ?

## 2023-10-08 NOTE — ED Provider Notes (Signed)
 [facility] Provider Note    Event Date/Time   First MD Initiated Contact with Patient 10/08/23 1150     (approximate)   History   Cough   HPI  Valerie Vazquez is a 68 year old female with history of hypertension, CAD, DM presenting to the emergency department for evaluation of fever and cough.  Symptoms started on Monday.  No sick contacts.  Tmax at home 103.5.  Reports feeling generally weak.  1 episode of vomiting.  Denies significant abdominal pain.  No diarrhea.  Some decreased p.o. intake.     Physical Exam   Triage Vital Signs: ED Triage Vitals  Encounter Vitals Group     BP 10/08/23 1138 (!) 53/40     Systolic BP Percentile --      Diastolic BP Percentile --      Pulse Rate 10/08/23 1134 (!) 101     Resp 10/08/23 1134 20     Temp 10/08/23 1134 99.9 F (37.7 C)     Temp Source 10/08/23 1134 Oral     SpO2 10/08/23 1134 95 %     Weight --      Height --      Head Circumference --      Peak Flow --      Pain Score 10/08/23 1132 0     Pain Loc --      Pain Education --      Exclude from Growth Chart --     Most recent vital signs: Vitals:   10/08/23 1400 10/08/23 1500  BP: 124/69 129/81  Pulse: 95 98  Resp: (!) 30 14  Temp:    SpO2: 95% 91%     General: Awake, interactive  CV:  Regular rate, good peripheral perfusion.  Resp:  Unlabored respirations, lung sounds coarse bilaterally Abd:  Nondistended, soft, no significant tenderness to palpation Neuro:  Symmetric facial movement, fluid speech   ED Results / Procedures / Treatments   Labs (all labs ordered are listed, but only abnormal results are displayed) Labs Reviewed  RESP PANEL BY RT-PCR (RSV, FLU A&B, COVID)  [id number] - Abnormal; Notable for the following components:      Result Value   Influenza A by PCR POSITIVE (*)    All other components within normal limits  BASIC METABOLIC PANEL - Abnormal; Notable for the following components:   Glucose, Bld 165 (*)     Creatinine, Ser 1.29 (*)    GFR, Estimated 45 (*)    All other components within normal limits  LACTIC ACID, PLASMA - Abnormal; Notable for the following components:   Lactic Acid, Venous 2.4 (*)    All other components within normal limits  CBG MONITORING, ED - Abnormal; Notable for the following components:   Glucose-Capillary 191 (*)    All other components within normal limits  CULTURE, BLOOD (ROUTINE X 2)  CULTURE, BLOOD (ROUTINE X 2)  CBC  LACTIC ACID, PLASMA  HEPATIC FUNCTION PANEL  URINALYSIS, W/ REFLEX TO CULTURE (INFECTION SUSPECTED)     EKG EKG independently reviewed interpreted by myself (ER attending) demonstrates:  EKG demonstrate sinus rhythm at a rate of 99, PR 133, QRS 89, QTc 425, no acute ST changes  RADIOLOGY Imaging independently reviewed and interpreted by myself demonstrates:  CXR without focal consolidation  PROCEDURES:  Critical Care performed: No  Procedures   MEDICATIONS ORDERED IN ED: Medications  sodium chloride  0.9 % bolus 2,000 mL (2,000 mLs Intravenous New Bag/Given 10/08/23 1330)  levofloxacin  (LEVAQUIN ) IVPB 750 mg (0 mg Intravenous Stopped 10/08/23 1441)     IMPRESSION / MDM / ASSESSMENT AND PLAN / ED COURSE  I reviewed the triage vital signs and the nursing notes.  Differential diagnosis includes, but is not limited to, pneumonia, viral illness, electrolyte abnormality, dehydration  Patient's presentation is most consistent with acute presentation with potential threat to life or bodily function.  68 year old female presenting to the emergency department for evaluation of fever and cough.  Tachycardic with profound hypotension in triage, remained hypotensive after adjusting cuff pressure.  BP improved to systolics in the 90s at the time of my initial evaluation.  Patient fortunately mentating fine.  Stable on room air.  Clinical history is more suggestive of viral process, but with significant vital sign abnormalities we will go ahead and  initiate sepsis orders.  Cephalosporin allergy, ordered for Levaquin .  Ordered for 30 cc/kg of fluid for ideal body weight.  Labs returned overall reassuring with normal white blood cell count, electrolytes without significant derangement.  Mild AKI noted.  Flu test did return positive.  Initial elevated lactate, cleared on repeat.  Patient reassessed.  Feels much improved.  Blood pressure has normalized, normal heart rate.  Satting appropriately on room air.  No longer meets SIRS criteria with heart rate in the 80s.  Suspect symptoms likely related to influenza infection.  Does report some urinary frequency, so we will wait urinalysis.  Signed out to oncoming physician at 1530 pending urine and disposition.  Patient remains feeling improved with stable vitals, may be stable for discharge with strict return precautions.       FINAL CLINICAL IMPRESSION(S) / ED DIAGNOSES   Final diagnoses:  Influenza A     Rx / DC Orders   ED Discharge Orders     None        Note:  This document was prepared using Dragon voice recognition software and may include unintentional dictation errors.   Levander Slate, MD 10/08/23 [phone]

## 2023-10-08 NOTE — ED Provider Notes (Signed)
 Patient received in signout from Dr. Levander. BP remains normal and stable. UA without clear infectious features, few squamous cells. We will send for a culture. Patient suitable for outpatient management. Discussed expectant management and ED return precautions.    Claudene Rover, MD 10/08/23 [phone]

## 2023-10-08 NOTE — ED Triage Notes (Addendum)
 Pt to ED via POV from home. Pt reports cough and fever that started Monday. Pt reports + sick contacts. Pt states highest temp 103.5.   BP in triage 53/40. Smaller cuff in place and BP 70s systolic

## 2023-10-08 NOTE — ED Notes (Signed)
 Lab called to obtain second set of blood cultures.

## 2023-10-08 NOTE — Discharge Instructions (Signed)
Use Tylenol for pain and fevers.  Up to 1000 mg per dose, up to 4 times per day.  Do not take more than 4000 mg of Tylenol/acetaminophen within 24 hours..  

## 2023-10-08 NOTE — ED Notes (Signed)
 Iv team consult placed. Antibiotics started before second set of blood cultures were obtained.

## 2023-10-09 ENCOUNTER — Telehealth: Payer: Self-pay

## 2023-10-09 LAB — URINE CULTURE: Culture: <10000 — AB

## 2023-10-09 NOTE — Transitions of Care (Post Inpatient/ED Visit) (Signed)
 10/09/2023  Name: Valerie Vazquez MRN: [id number] DOB: Mar 31, 1956  Today's TOC FU Call Status: Today's TOC FU Call Status:: Successful TOC FU Call Completed TOC FU Call Complete Date: 10/09/23 Patient's Name and Date of Birth confirmed.  Transition Care Management Follow-up Telephone Call Date of Discharge: 10/08/23 Discharge Facility: [facility]) Type of Discharge: Emergency Department Reason for ED Visit: Other: (influenza) How have you been since you were released from the hospital?: Better Any questions or concerns?: No  Items Reviewed: Did you receive and understand the discharge instructions provided?: Yes Medications obtained,verified, and reconciled?: Yes (Medications Reviewed) Any new allergies since your discharge?: No Dietary orders reviewed?: Yes Do you have support at home?: No  Medications Reviewed Today: Medications Reviewed Today     Reviewed by Emmitt Pan, LPN (Licensed Practical Nurse) on 10/09/23 at 1204  Med List Status: <None>   Medication Order Taking? Sig Documenting Provider Last Dose Status Informant  acetaminophen  (TYLENOL ) 500 MG tablet [id number] No Take 1 tablet (500 mg total) by mouth every 6 (six) hours as needed. Alona Knee, PA-C Taking Active Self  apixaban  (ELIQUIS ) 5 MG TABS tablet [id number] No Take 1 tablet (5 mg total) by mouth 2 (two) times daily. Maribeth Camellia MATSU, MD Taking Active Self  baclofen  (LIORESAL ) 10 MG tablet [id number] No TAKE ONE TABLET BY MOUTH THREE TIMES DAILY AS NEEDED FOR MUSCLE SPASMS (VIAL) Maribeth Camellia MATSU, MD Taking Active Self  blood glucose meter kit and supplies KIT [id number] No Dispense based on patient and insurance preference. Use once daily fasting. Dx code E11.9. Maribeth Camellia MATSU, MD Taking Active Self  Blood Glucose Monitoring Suppl (GHT BLOOD GLUCOSE MONITOR) w/Device KIT [id number] No DISPENSE BASED ON PATIENT AND INSURANCE PREFERENCE. USE ONCE DAILY AS DIRECTED. DX CODE  E11.9. Maribeth Camellia MATSU, MD Taking Active Self  cyanocobalamin  (VITAMIN B12) 1000 MCG tablet [id number] No Take 1,000 mcg by mouth daily. Gummy [provider] Taking Active Self  dicyclomine  (BENTYL ) 20 MG tablet [id number] No TAKE ONE TABLET BY MOUTH THREE TIMES DAILY AS NEEDED FOR ABDOMINAL SPASMS (VIAL) Maribeth Camellia MATSU, MD Taking Active   escitalopram  (LEXAPRO ) 20 MG tablet [id number] No TAKE ONE TABLET BY MOUTH DAILY AT CHANETTA Maribeth Camellia MATSU, MD Taking Active Self  estradiol  (ESTRACE ) 0.1 MG/GM vaginal cream [id number] No Place vaginally at bedtime. 1 application placed on outside  Patient not taking: Reported on 09/21/2023   [provider] Not Taking Active Self  famotidine  (PEPCID ) 20 MG tablet [id number]  TAKE 1 TABLET BY MOUTH EVERYDAY AT BEDTIME Maribeth Camellia MATSU, MD  Active   ferrous sulfate  325 (65 FE) MG tablet [id number] No Take 325 mg by mouth daily with breakfast. [provider] Taking Active Self  fluticasone  (FLONASE ) 50 MCG/ACT nasal spray [id number] No SPRAY 2 SPRAYS INTO EACH NOSTRIL EVERY DAY Maribeth Camellia MATSU, MD Taking Active   furosemide  (LASIX ) 20 MG tablet [id number] No TAKE ONE TABLET BY MOUTH DAILY AS NEEDED FOR EDEMA (VIAL) Maribeth Camellia MATSU, MD Taking Active Self  gabapentin  (NEURONTIN ) 600 MG tablet [id number] No TAKE ONE TABLET BY MOUTH DAILY AT 9AM and TAKE 1 AND 1/2 TABLETS BY MOUTH DAILY AT 9PM (VIAL)  Patient taking differently: Take 600-900 mg by mouth See admin instructions. TAKE ONE TABLET BY MOUTH DAILY AT 9AM and TAKE 1 AND 1/2 TABLETS BY MOUTH DAILY AT 9PM (VIAL)   Sonnenberg, Eric G, MD Taking Active Self  isosorbide  mononitrate (IMDUR ) 30 MG 24 hr tablet [id number]  No Take 0.5 tablets (15 mg total) by mouth daily. Darliss Rogue, MD Taking Active Self  JARDIANCE  25 MG TABS tablet [id number] No TAKE 1 TABLET (25 MG TOTAL) BY MOUTH DAILY. Maribeth Camellia MATSU, MD Taking Active Self  levothyroxine  (SYNTHROID ) 25 MCG tablet  [id number] No TAKE ONE TABLET BY MOUTH DAILY AT 7AM BEFORE BREAKFAST AND 2-3 HOURS BEFORE TAKING ANY OTHER MEDICATION OR SUPPLEMENT Maribeth Camellia MATSU, MD Taking Active   Magnesium  200 MG TABS [id number] No Take 200 mg by mouth daily. [provider] Taking Active Self  Multiple Vitamins-Minerals (MULTIVITAMIN GUMMIES ADULT) CHEW [id number] No Chew 2 each by mouth daily. [provider] Taking Active Self  nitroGLYCERIN  (NITROSTAT ) 0.4 MG SL tablet [id number] No Place 1 tablet (0.4 mg total) under the tongue every 5 (five) minutes as needed for chest pain. Darliss Rogue, MD Taking Active Self  OLANZapine  (ZYPREXA ) 10 MG tablet [id number] No TAKE 1 TABLET BY MOUTH EVERYDAY AT BEDTIME Maribeth Camellia MATSU, MD Taking Active Self  OVER THE COUNTER MEDICATION [id number] No Take 2 each by mouth daily. Power C gummies [provider] Taking Active Self           Med Note KERRIN SETTER R   Tue Jul 21, 2023  9:47 AM)    pantoprazole  (PROTONIX ) 40 MG tablet [id number] No Take 1 tablet (40 mg total) by mouth daily. [place], PA-C Taking Active Self  potassium chloride  SA (KLOR-CON  M) 20 MEQ tablet [id number] No TAKE ONE TABLET BY MOUTH DAILY AS NEEDED TAKE WITH LASIX  (VIAL) Maribeth Camellia MATSU, MD Taking Active Self  Probiotic Product (PROBIOTIC BLEND) CAPS [id number] No Take 1 capsule by mouth daily. [provider] Taking Active Self  rosuvastatin  (CRESTOR ) 40 MG tablet [id number] No TAKE 1 TABLET BY MOUTH EVERY DAY  Patient taking differently: Take 40 mg by mouth at bedtime.   Maribeth Camellia MATSU, MD Taking Active Self  Semaglutide ,0.25 or 0.5MG /DOS, (OZEMPIC , 0.25 OR 0.5 MG/DOSE,) 2 MG/3ML SOPN [id number] No INJECT 0.5 MG INTO THE SKIN ONE TIME PER WEEK Maribeth Camellia MATSU, MD Taking Active Self           Med Note KERRIN SETTER SAUNDERS   Tue Jul 21, 2023  9:48 AM) Saturdays   sucralfate  (CARAFATE ) 1 g tablet [id number] No TAKE 1 TABLET (1 G TOTAL) BY MOUTH 3 TIMES A DAY  BEFORE MEALS [place], PA-C Taking Active   traMADol  (ULTRAM ) 50 MG tablet [id number] No Take 1 tablet (50 mg total) by mouth every 6 (six) hours as needed for moderate pain (pain score 4-6).  Patient not taking: Reported on 09/21/2023   Onetha Kuba, MD Not Taking Active   triamcinolone  cream (KENALOG ) 0.5 % [id number] No Apply 1 Application topically 2 (two) times daily as needed (irritation). [provider] Taking Active Self  Zinc  Citrate (ZINC  GUMMY PO) [id number] No Take 2 each by mouth daily. [provider] Taking Active Self  Med List Note Debora Karna LABOR, RN 01/29/16 1550): New patient UDS: 01/29/16            Home Care and Equipment/Supplies: Were Home Health Services Ordered?: NA Any new equipment or medical supplies ordered?: NA  Functional Questionnaire: Do you need assistance with bathing/showering or dressing?: No Do you need assistance with meal preparation?: No Do you need assistance with eating?: No Do you have difficulty maintaining continence: No Do you need assistance with getting out of bed/getting out of a chair/moving?: No Do you have difficulty managing or  taking your medications?: No  Follow up appointments reviewed: PCP Follow-up appointment confirmed?: No (no avail appt, sent message to staff to schedule) MD Provider Line [phone] Given: No Specialist Hospital Follow-up appointment confirmed?: NA Do you need transportation to your follow-up appointment?: No Do you understand care options if your condition(s) worsen?: Yes-patient verbalized understanding    SIGNATURE Julian Lemmings, LPN [facility] Nurse Health Advisor Direct Dial [phone]

## 2023-10-12 ENCOUNTER — Ambulatory Visit (INDEPENDENT_AMBULATORY_CARE_PROVIDER_SITE_OTHER): Payer: 59 | Admitting: Family Medicine

## 2023-10-12 ENCOUNTER — Encounter: Payer: Self-pay | Admitting: Family Medicine

## 2023-10-12 VITALS — BP 126/80 | HR 86 | Temp 98.6°F | Ht 66.0 in | Wt 192.4 lb

## 2023-10-12 DIAGNOSIS — J101 Influenza due to other identified influenza virus with other respiratory manifestations: Secondary | ICD-10-CM

## 2023-10-12 IMAGING — US US PELVIS COMPLETE WITH TRANSVAGINAL
1 series · 14 of 25 positions shown · non-contrast
Comparison: Pelvic ultrasound 09/26/2019 and 09/08/2018

CLINICAL DATA: Bilateral ovarian cysts.



[Series 1: us pelvic complete with transvaginal · 14 of 66 slices shown]
[im 1/66]
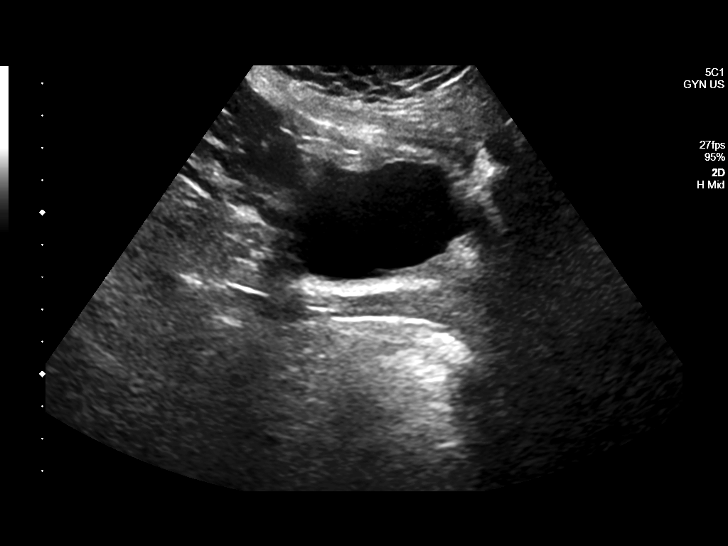
[im 6/66]
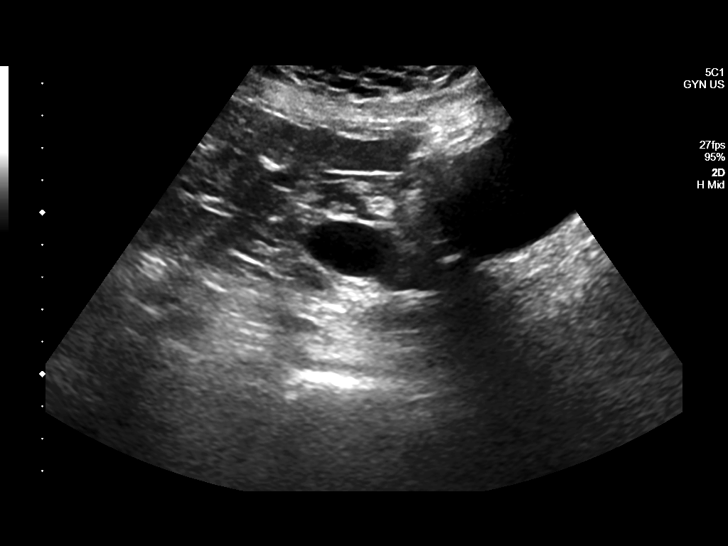
[im 11/66]
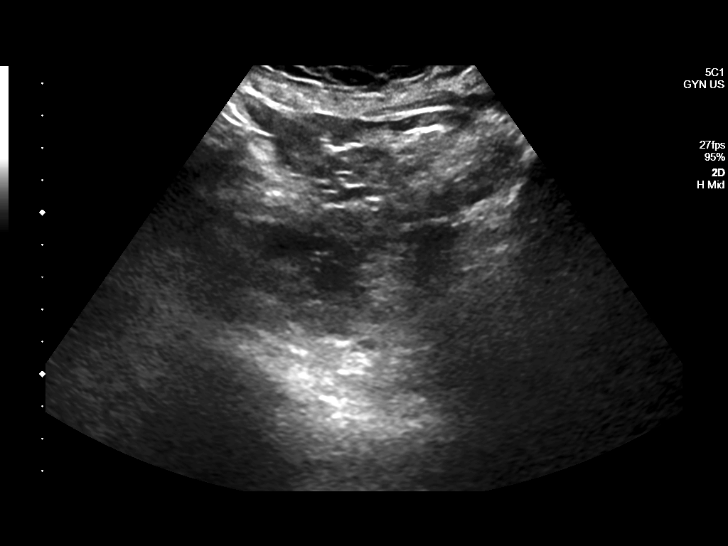
[im 17/66]
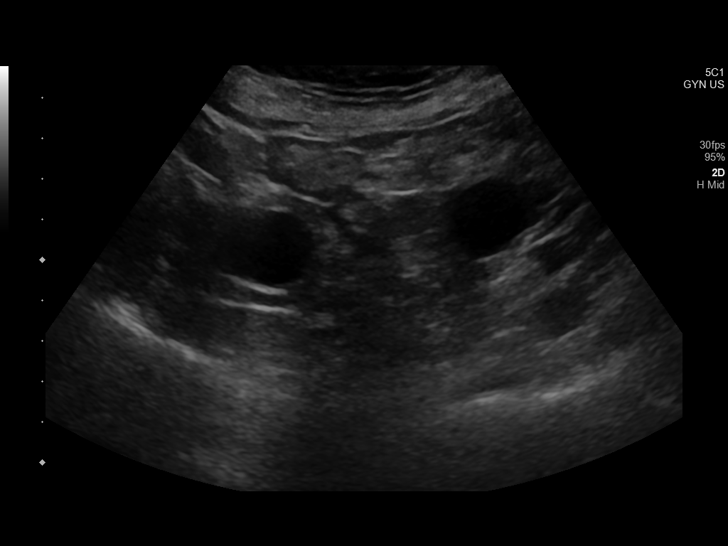
[im 22/66]
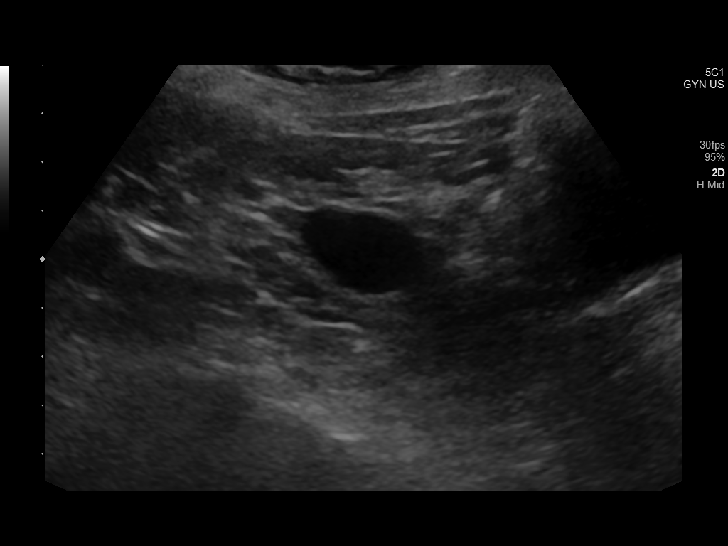
[im 25/66]
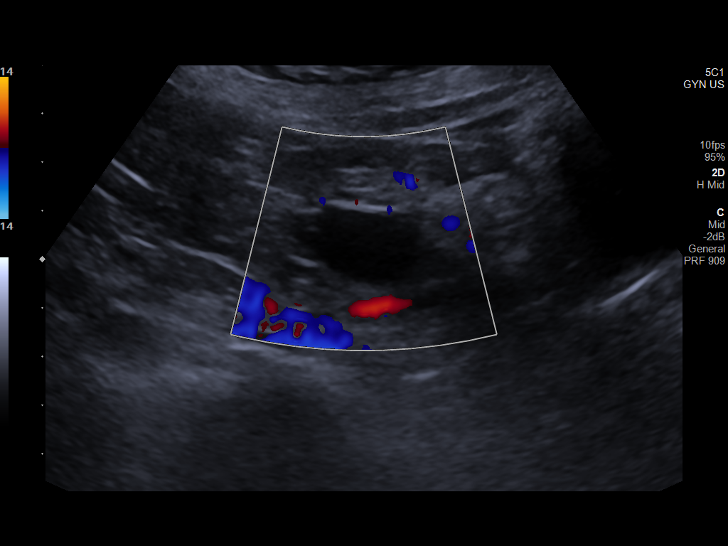
[im 30/66]
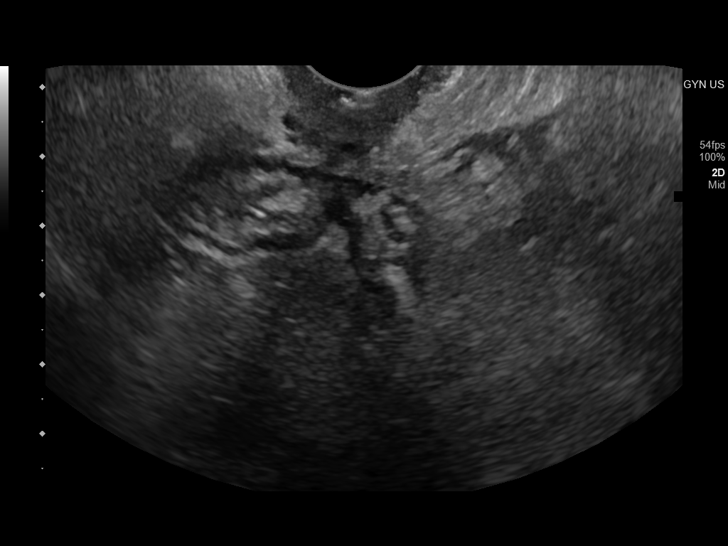
[im 36/66]
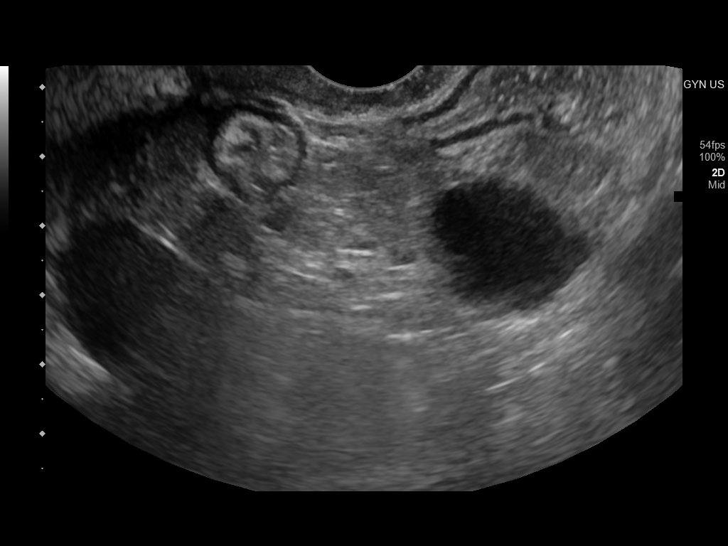
[im 41/66]
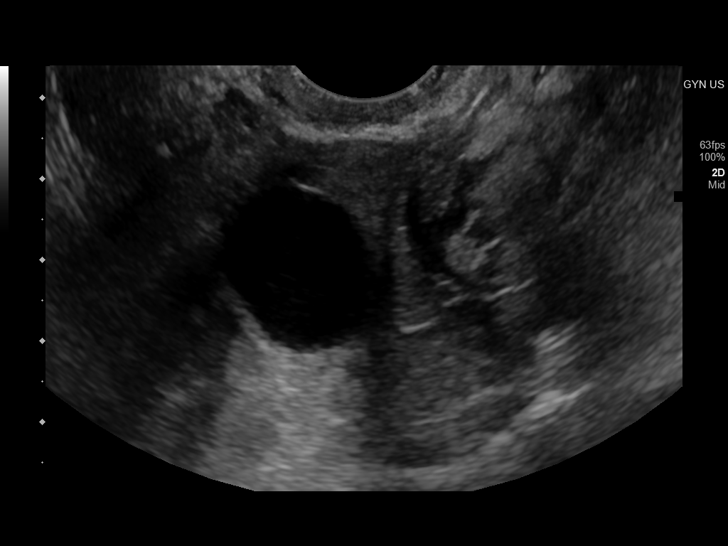
[im 44/66]
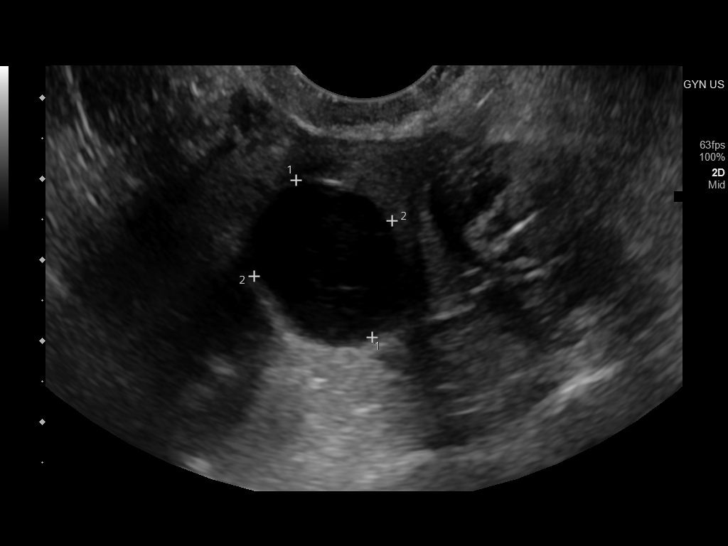
[im 49/66]
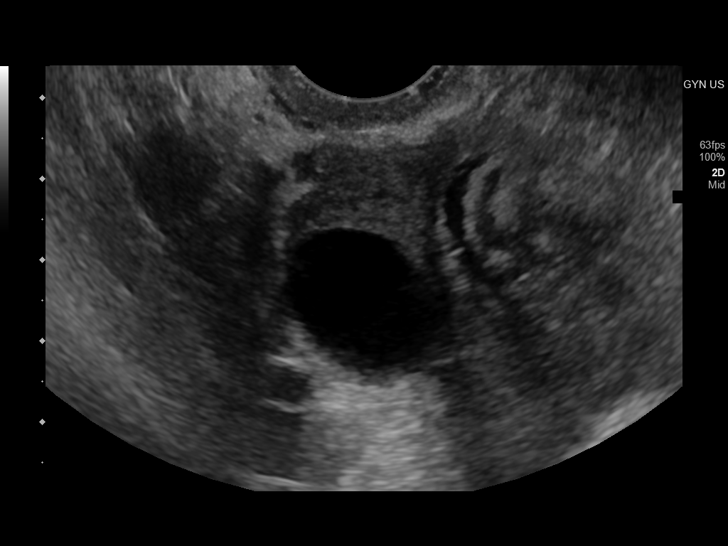
[im 55/66]
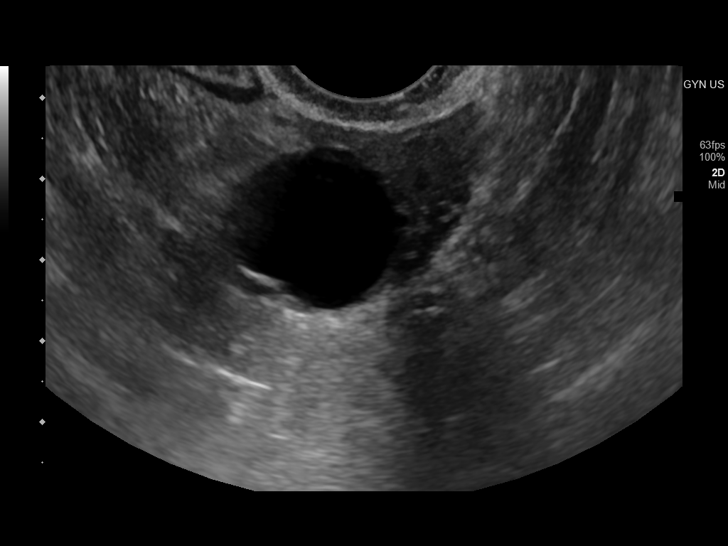
[im 60/66]
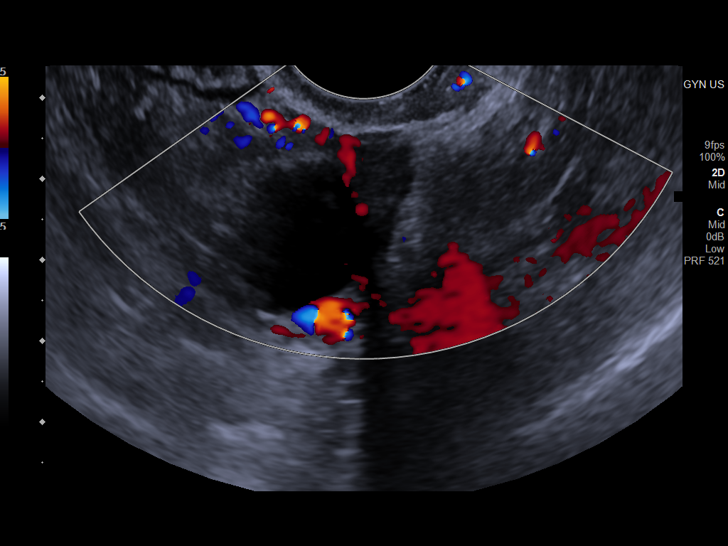
[im 66/66]
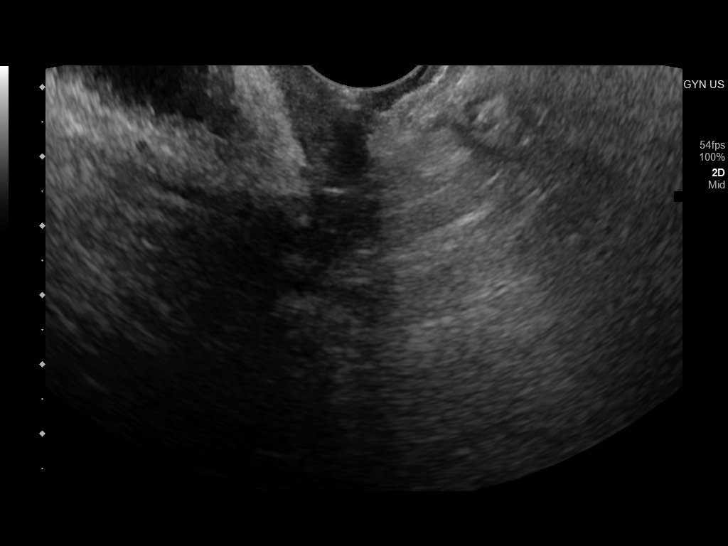

[14 of 25 positions shown; findings below may reference images not displayed]

FINDINGS: Uterus

Surgically absent.

Right ovary

Measurements: 2.7 x 2.4 x 1.8 cm = volume: 6.0 mL. 2.2 x 1.8 x
cm simple cyst (previously 1.9 x 1.7 x 1.7 cm in 5253 and 2.5 x
x 2.4 cm in 3737).

Left ovary

Measurements: 3.7 x 2.0 x 2.8 cm = volume: 11.0 mL. 2.2 x 1.7 x
cm simple cyst (previously 2.1 x 2.0 x 1.7 cm in 5253 and 1.9 x
x 2.2 cm in 3737

Other findings

No abnormal free fluid.
IMPRESSION: Small, simple bilateral ovarian cysts without significant interval
change. No suspicious lesion.

No follow-up imaging is recommended.

Reference: Radiology [DATE]):[id number]

## 2023-10-12 MED ORDER — PREDNISONE 20 MG PO TABS: 40.0000 mg | ORAL_TABLET | Freq: Every day | ORAL | 0 refills | Status: DC

## 2023-10-12 MED ORDER — ALBUTEROL SULFATE HFA 108 (90 BASE) MCG/ACT IN AERS: 2.0000 | INHALATION_SPRAY | Freq: Four times a day (QID) | RESPIRATORY_TRACT | 0 refills | Status: DC | PRN

## 2023-10-12 NOTE — Telephone Encounter (Signed)
Plan to see today

## 2023-10-12 NOTE — Progress Notes (Signed)
 Nelly Banco, MD Phone: [phone]  Valerie Vazquez is a 67 y.o. female who presents today for same-day visit.  Influenza A: Patient notes she is feeling somewhat better.  Her symptoms started 1 week ago.  She was seen in the emergency department 4 days ago and diagnosed with influenza A.  She was hypotensive and was given fluids.  They gave her a dose of Levaquin  initially thinking that something bacterial could be going on.  She was not discharged on any medications.  She continues to have cough though it is improving.  She feels congested in her chest.  Last fever was 2 days ago.  Does report some wheezing.  Notes her sore throat has improved.  Some mild dyspnea.  Vomited 1 time.  Has chronic diarrhea that is not changed.  Chest x-ray with no signs of pneumonia.  Social History   Tobacco Use  Smoking Status Former   Current packs/day: 0.00   Average packs/day: 1.5 packs/day for 10.0 years (15.0 ttl pk-yrs)   Types: Cigarettes   Start date: 04/11/1969   Quit date: 04/12/1979   Years since quitting: 44.5  Smokeless Tobacco Never    Current Outpatient Medications on File Prior to Visit  Medication Sig Dispense Refill   acetaminophen  (TYLENOL ) 500 MG tablet Take 1 tablet (500 mg total) by mouth every 6 (six) hours as needed. 30 tablet 0   apixaban  (ELIQUIS ) 5 MG TABS tablet Take 1 tablet (5 mg total) by mouth 2 (two) times daily. 180 tablet 1   baclofen  (LIORESAL ) 10 MG tablet TAKE ONE TABLET BY MOUTH THREE TIMES DAILY AS NEEDED FOR MUSCLE SPASMS (VIAL) 60 tablet 11   blood glucose meter kit and supplies KIT Dispense based on patient and insurance preference. Use once daily fasting. Dx code E11.9. 1 each 0   Blood Glucose Monitoring Suppl (GHT BLOOD GLUCOSE MONITOR) w/Device KIT DISPENSE BASED ON PATIENT AND INSURANCE PREFERENCE. USE ONCE DAILY AS DIRECTED. DX CODE E11.9. 1 kit 0   cyanocobalamin  (VITAMIN B12) 1000 MCG tablet Take 1,000 mcg by mouth daily. Gummy     dicyclomine   (BENTYL ) 20 MG tablet TAKE ONE TABLET BY MOUTH THREE TIMES DAILY AS NEEDED FOR ABDOMINAL SPASMS (VIAL) 270 tablet 0   escitalopram  (LEXAPRO ) 20 MG tablet TAKE ONE TABLET BY MOUTH DAILY AT 9AM 90 tablet 0   estradiol  (ESTRACE ) 0.1 MG/GM vaginal cream Place vaginally at bedtime. 1 application placed on outside (Patient not taking: Reported on 09/21/2023)     famotidine  (PEPCID ) 20 MG tablet TAKE 1 TABLET BY MOUTH EVERYDAY AT BEDTIME 90 tablet 1   ferrous sulfate  325 (65 FE) MG tablet Take 325 mg by mouth daily with breakfast.     fluticasone  (FLONASE ) 50 MCG/ACT nasal spray SPRAY 2 SPRAYS INTO EACH NOSTRIL EVERY DAY 48 mL 2   furosemide  (LASIX ) 20 MG tablet TAKE ONE TABLET BY MOUTH DAILY AS NEEDED FOR EDEMA (VIAL) 90 tablet 3   gabapentin  (NEURONTIN ) 600 MG tablet TAKE ONE TABLET BY MOUTH DAILY AT 9AM and TAKE 1 AND 1/2 TABLETS BY MOUTH DAILY AT 9PM (VIAL) (Patient taking differently: Take 600-900 mg by mouth See admin instructions. TAKE ONE TABLET BY MOUTH DAILY AT 9AM and TAKE 1 AND 1/2 TABLETS BY MOUTH DAILY AT 9PM (VIAL)) 225 tablet 11   isosorbide  mononitrate (IMDUR ) 30 MG 24 hr tablet Take 0.5 tablets (15 mg total) by mouth daily.     JARDIANCE  25 MG TABS tablet TAKE 1 TABLET (25 MG TOTAL) BY MOUTH  DAILY. 90 tablet 3   levothyroxine  (SYNTHROID ) 25 MCG tablet TAKE ONE TABLET BY MOUTH DAILY AT 7AM BEFORE BREAKFAST AND 2-3 HOURS BEFORE TAKING ANY OTHER MEDICATION OR SUPPLEMENT 90 tablet 11   Magnesium  200 MG TABS Take 200 mg by mouth daily.     Multiple Vitamins-Minerals (MULTIVITAMIN GUMMIES ADULT) CHEW Chew 2 each by mouth daily.     nitroGLYCERIN  (NITROSTAT ) 0.4 MG SL tablet Place 1 tablet (0.4 mg total) under the tongue every 5 (five) minutes as needed for chest pain. 25 tablet 3   OLANZapine  (ZYPREXA ) 10 MG tablet TAKE 1 TABLET BY MOUTH EVERYDAY AT BEDTIME 90 tablet 0   OVER THE COUNTER MEDICATION Take 2 each by mouth daily. Power C gummies     pantoprazole  (PROTONIX ) 40 MG tablet Take 1 tablet  (40 mg total) by mouth daily. 90 tablet 1   potassium chloride  SA (KLOR-CON  M) 20 MEQ tablet TAKE ONE TABLET BY MOUTH DAILY AS NEEDED TAKE WITH LASIX  (VIAL) 90 tablet 11   Probiotic Product (PROBIOTIC BLEND) CAPS Take 1 capsule by mouth daily.     rosuvastatin  (CRESTOR ) 40 MG tablet TAKE 1 TABLET BY MOUTH EVERY DAY (Patient taking differently: Take 40 mg by mouth at bedtime.) 90 tablet 1   Semaglutide ,0.25 or 0.5MG /DOS, (OZEMPIC , 0.25 OR 0.5 MG/DOSE,) 2 MG/3ML SOPN INJECT 0.5 MG INTO THE SKIN ONE TIME PER WEEK 3 mL 2   sucralfate  (CARAFATE ) 1 g tablet TAKE 1 TABLET (1 G TOTAL) BY MOUTH 3 TIMES A DAY BEFORE MEALS 90 tablet 2   traMADol  (ULTRAM ) 50 MG tablet Take 1 tablet (50 mg total) by mouth every 6 (six) hours as needed for moderate pain (pain score 4-6). (Patient not taking: Reported on 09/21/2023) 30 tablet 0   triamcinolone  cream (KENALOG ) 0.5 % Apply 1 Application topically 2 (two) times daily as needed (irritation).     Zinc  Citrate (ZINC  GUMMY PO) Take 2 each by mouth daily.     No current facility-administered medications on file prior to visit.     ROS see history of present illness  Objective  Physical Exam Vitals:   10/12/23 0928  BP: 126/80  Pulse: 86  Temp: 98.6 F (37 C)  SpO2: 96%    BP Readings from Last 3 Encounters:  10/12/23 126/80  10/08/23 132/71  09/21/23 116/62   Wt Readings from Last 3 Encounters:  10/12/23 192 lb 6.4 oz (87.3 kg)  09/21/23 191 lb 6.4 oz (86.8 kg)  07/27/23 195 lb (88.5 kg)    Physical Exam Constitutional:      General: She is not in acute distress.    Appearance: She is not diaphoretic.  Cardiovascular:     Rate and Rhythm: Normal rate and regular rhythm.     Heart sounds: Normal heart sounds.  Pulmonary:     Effort: Pulmonary effort is normal.     Breath sounds: No wheezing.     Comments: Mild coarse expiratory breath sounds throughout, no focal abnormal breath sounds Skin:    General: Skin is warm and dry.  Neurological:      Mental Status: She is alert.      Assessment/Plan: Please see individual problem list.  Influenza A Assessment & Plan: Patient has improved some from her initial symptoms.  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  Given these symptoms and that history of mild COPD will proceed with prednisone  40 mg daily for 5 days.  I will also  send in albuterol  inhaler.  Advised to let us  know if her symptoms are not improving over the next several days.  Advised if she develops fever again she needs to contact us  right away.  Discussed risk of sleep issues, agitation, and rising glucose on the prednisone .  If she develops glucose greater than 300 she will let us  know.  Orders: -     predniSONE ; Take 2 tablets (40 mg total) by mouth daily with breakfast.  Dispense: 10 tablet; Refill: 0 -     Albuterol  Sulfate HFA; Inhale 2 puffs into the lungs every 6 (six) hours as needed for wheezing or shortness of breath.  Dispense: 8 g; Refill: 0    Return if symptoms worsen or fail to improve.   Nelly Banco, MD [facility]

## 2023-10-12 NOTE — Patient Instructions (Signed)
 Nice to see you. We are going to treat you with prednisone .  You can use the albuterol  if you have wheezing or shortness of breath.  Please monitor your glucose while on the prednisone .  If he gets up above 300 please let us  know.

## 2023-10-12 NOTE — Assessment & Plan Note (Signed)
 Patient has improved some from her initial symptoms.  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  Given these symptoms and that history of mild COPD will proceed with prednisone  40 mg daily for 5 days.  I will also send in albuterol  inhaler.  Advised to let us  know if her symptoms are not improving over the next several days.  Advised if she develops fever again she needs to contact us  right away.  Discussed risk of sleep issues, agitation, and rising glucose on the prednisone .  If she develops glucose greater than 300 she will let us  know.

## 2023-10-13 LAB — CULTURE, BLOOD (ROUTINE X 2)
Culture: NO GROWTH
Culture: NO GROWTH

## 2023-10-21 ENCOUNTER — Other Ambulatory Visit: Payer: Self-pay | Admitting: Family Medicine

## 2023-10-21 DIAGNOSIS — K219 Gastro-esophageal reflux disease without esophagitis: Secondary | ICD-10-CM

## 2023-10-21 DIAGNOSIS — J101 Influenza due to other identified influenza virus with other respiratory manifestations: Secondary | ICD-10-CM

## 2023-10-26 ENCOUNTER — Telehealth: Payer: Self-pay | Admitting: Family Medicine

## 2023-10-26 MED ORDER — BACLOFEN 10 MG PO TABS: ORAL_TABLET | ORAL | 2 refills | Status: DC

## 2023-10-26 NOTE — Addendum Note (Signed)
 Addended by: Glori Luis on: 10/26/2023 03:30 PM   Modules accepted: Orders

## 2023-10-26 NOTE — Telephone Encounter (Signed)
 Left message for patient to make her aware of Dr Purvis Sheffield recommendations. Advised patient to give our office a call back to clarify if she would like to move forward with referral to GI.   OK to relay message if patient calls back. If message is relayed, please notify office.

## 2023-10-26 NOTE — Telephone Encounter (Addendum)
 Noted. I would suggest follow-up with GI to determine if there are other options for the dicyclomine. I can refer her back if she is ok with that. I refilled the baclofen.

## 2023-10-26 NOTE — Telephone Encounter (Signed)
 Spoke with patient to make her aware of recommendations per Dr Birdie Sons. Patient states she is taking the dicyclomine as needed and taking Olanzapine daily. Patient says the last dose of dicyclomine was yesterday or the day before. Patient is requesting a Baclofen prescription, as she is having an issues with her back pain. Patient states she was trying to request a refill via [facility] last week and has not heard any updates.

## 2023-10-26 NOTE — Telephone Encounter (Signed)
 Please let the patient know that I got a fax from her insurance company.  They are concerned that she is on 2 anticholinergic medicines that could cause potential side fax.  These medicines are the dicyclomine and the olanzapine.  Please see how frequently she is taking the dicyclomine.  Thanks.

## 2023-11-02 ENCOUNTER — Other Ambulatory Visit: Payer: Self-pay

## 2023-11-02 DIAGNOSIS — M545 Low back pain, unspecified: Secondary | ICD-10-CM

## 2023-11-02 NOTE — Telephone Encounter (Signed)
Gi referral placed  

## 2023-11-02 NOTE — Telephone Encounter (Signed)
 Copied from CRM [id number]. Topic: General - Other >> Nov 02, 2023 10:24 AM Aletta Edouard wrote: Reason for CRM: patient is calling in regarding her  gastroenterologist i  doctor  referral

## 2023-11-03 ENCOUNTER — Telehealth: Payer: Self-pay

## 2023-11-03 NOTE — Telephone Encounter (Signed)
 Copied from CRM [id number]. Topic: General - Other >> Nov 03, 2023  2:37 PM Orinda Kenner C wrote: Reason for CRM: Kiel pharmacist with [employer] [phone] faxed 10/16/23 about disconnecting medications. Kiel will refaxed the forms.

## 2023-11-04 DIAGNOSIS — E119 Type 2 diabetes mellitus without complications: Secondary | ICD-10-CM | POA: Diagnosis not present

## 2023-11-04 DIAGNOSIS — E039 Hypothyroidism, unspecified: Secondary | ICD-10-CM | POA: Diagnosis not present

## 2023-11-04 NOTE — Telephone Encounter (Signed)
 Noted, fax hasn't been received yet

## 2023-11-06 ENCOUNTER — Ambulatory Visit: Payer: 59 | Admitting: Obstetrics

## 2023-11-12 DIAGNOSIS — M4316 Spondylolisthesis, lumbar region: Secondary | ICD-10-CM | POA: Diagnosis not present

## 2023-11-16 ENCOUNTER — Other Ambulatory Visit: Payer: Self-pay

## 2023-11-16 DIAGNOSIS — E119 Type 2 diabetes mellitus without complications: Secondary | ICD-10-CM

## 2023-11-16 DIAGNOSIS — J101 Influenza due to other identified influenza virus with other respiratory manifestations: Secondary | ICD-10-CM

## 2023-11-16 MED ORDER — ALBUTEROL SULFATE HFA 108 (90 BASE) MCG/ACT IN AERS: 2.0000 | INHALATION_SPRAY | Freq: Four times a day (QID) | RESPIRATORY_TRACT | 0 refills | Status: DC | PRN

## 2023-11-16 MED ORDER — APIXABAN 5 MG PO TABS: 5.0000 mg | ORAL_TABLET | Freq: Two times a day (BID) | ORAL | 1 refills | Status: DC

## 2023-11-16 MED ORDER — OZEMPIC (0.25 OR 0.5 MG/DOSE) 2 MG/3ML ~~LOC~~ SOPN: PEN_INJECTOR | SUBCUTANEOUS | 2 refills | Status: DC

## 2023-11-18 ENCOUNTER — Telehealth: Payer: Self-pay | Admitting: Family Medicine

## 2023-11-18 NOTE — Telephone Encounter (Signed)
 Dr Clent Ridges is leaving the practice and your New Patient or Transfer of Care appointment needs to be rescheduled with another provider. Please call the office to schedule a Transfer of Care to either Dr Charlann Lange, Darleen Crocker or Kara Dies, NP.  E2C2, please reschedule this patient's TOC visit.    Thank you

## 2023-11-22 DIAGNOSIS — N3000 Acute cystitis without hematuria: Secondary | ICD-10-CM | POA: Diagnosis not present

## 2023-11-25 DIAGNOSIS — E119 Type 2 diabetes mellitus without complications: Secondary | ICD-10-CM | POA: Diagnosis not present

## 2023-11-25 DIAGNOSIS — M84372S Stress fracture, left ankle, sequela: Secondary | ICD-10-CM | POA: Diagnosis not present

## 2023-11-30 DIAGNOSIS — E119 Type 2 diabetes mellitus without complications: Secondary | ICD-10-CM | POA: Diagnosis not present

## 2023-11-30 DIAGNOSIS — E039 Hypothyroidism, unspecified: Secondary | ICD-10-CM | POA: Diagnosis not present

## 2023-12-02 ENCOUNTER — Telehealth: Payer: Self-pay

## 2023-12-02 NOTE — Telephone Encounter (Signed)
 Spoke to pharmacy was notifying that medication manufacture was changing.called and informed pt. Pt verbalized understanding.  Me is being filled

## 2023-12-02 NOTE — Telephone Encounter (Signed)
 Copied from CRM [id number]. Topic: Clinical - Prescription Issue >> Dec 02, 2023  9:26 AM Gurney Maxin H wrote: Reason for CRM: Patient states she reached out to pharmacy to refill her levothyroxine (SYNTHROID) 25 MCG tablet and was advised the medication isn't made any more and needs an alternative medication sent to the pharmacy, please send to the pharmacy on file, thanks.   [name] [phone]

## 2023-12-04 DIAGNOSIS — E119 Type 2 diabetes mellitus without complications: Secondary | ICD-10-CM | POA: Diagnosis not present

## 2023-12-04 DIAGNOSIS — E039 Hypothyroidism, unspecified: Secondary | ICD-10-CM | POA: Diagnosis not present

## 2023-12-07 ENCOUNTER — Other Ambulatory Visit: Payer: Self-pay | Admitting: Family Medicine

## 2023-12-07 NOTE — Telephone Encounter (Unsigned)
 Copied from CRM [id number]. Topic: Clinical - Medication Refill >> Dec 07, 2023  3:13 PM Marya Fossa L wrote: Most Recent Primary Care Visit:  Provider: Birdie Sons, ERIC G  Department: [facility]  Visit Type: OFFICE VISIT  Date: 10/12/2023  Medication: Semaglutide,0.25 or 0.5MG /DOS, (OZEMPIC, 0.25 OR 0.5 MG/DOSE,) 2 MG/3ML SOPN  Has the patient contacted their pharmacy? Yes (Agent: If no, request that the patient contact the pharmacy for the refill. If patient does not wish to contact the pharmacy document the reason why and proceed with request.) (Agent: If yes, when and what did the pharmacy advise?)  Is this the correct pharmacy for this prescription? Yes If no, delete pharmacy and type the correct one.  This is the patient's preferred pharmacy:  [id number] [place], [place] - [address] [place] [address] Phone: [phone] Fax: [phone]   Has the prescription been filled recently? Yes  Is the patient out of the medication? Yes  Has the patient been seen for an appointment in the last year OR does the patient have an upcoming appointment? Yes  Can we respond through MyChart? Yes  Agent: Please be advised that Rx refills may take up to 3 business days. We ask that you follow-up with your pharmacy.

## 2023-12-15 ENCOUNTER — Other Ambulatory Visit: Payer: Self-pay | Admitting: Physician Assistant

## 2023-12-15 DIAGNOSIS — R110 Nausea: Secondary | ICD-10-CM

## 2023-12-17 ENCOUNTER — Other Ambulatory Visit: Payer: Self-pay | Admitting: Internal Medicine

## 2023-12-17 DIAGNOSIS — J101 Influenza due to other identified influenza virus with other respiratory manifestations: Secondary | ICD-10-CM

## 2023-12-22 ENCOUNTER — Encounter: Payer: 59 | Admitting: Family Medicine

## 2023-12-23 DIAGNOSIS — N3000 Acute cystitis without hematuria: Secondary | ICD-10-CM | POA: Diagnosis not present

## 2023-12-29 DIAGNOSIS — E038 Other specified hypothyroidism: Secondary | ICD-10-CM | POA: Diagnosis not present

## 2023-12-29 DIAGNOSIS — K582 Mixed irritable bowel syndrome: Secondary | ICD-10-CM | POA: Diagnosis not present

## 2023-12-29 DIAGNOSIS — K2100 Gastro-esophageal reflux disease with esophagitis, without bleeding: Secondary | ICD-10-CM | POA: Diagnosis not present

## 2023-12-30 DIAGNOSIS — K2100 Gastro-esophageal reflux disease with esophagitis, without bleeding: Secondary | ICD-10-CM | POA: Diagnosis not present

## 2023-12-30 DIAGNOSIS — E038 Other specified hypothyroidism: Secondary | ICD-10-CM | POA: Diagnosis not present

## 2023-12-30 DIAGNOSIS — K582 Mixed irritable bowel syndrome: Secondary | ICD-10-CM | POA: Diagnosis not present

## 2023-12-30 DIAGNOSIS — E039 Hypothyroidism, unspecified: Secondary | ICD-10-CM | POA: Diagnosis not present

## 2023-12-30 DIAGNOSIS — E119 Type 2 diabetes mellitus without complications: Secondary | ICD-10-CM | POA: Diagnosis not present

## 2023-12-31 DIAGNOSIS — Z5982 Transportation insecurity: Secondary | ICD-10-CM | POA: Diagnosis not present

## 2023-12-31 DIAGNOSIS — K2100 Gastro-esophageal reflux disease with esophagitis, without bleeding: Secondary | ICD-10-CM | POA: Diagnosis not present

## 2023-12-31 DIAGNOSIS — E038 Other specified hypothyroidism: Secondary | ICD-10-CM | POA: Diagnosis not present

## 2023-12-31 DIAGNOSIS — K582 Mixed irritable bowel syndrome: Secondary | ICD-10-CM | POA: Diagnosis not present

## 2023-12-31 DIAGNOSIS — E119 Type 2 diabetes mellitus without complications: Secondary | ICD-10-CM | POA: Diagnosis not present

## 2024-01-03 DIAGNOSIS — E119 Type 2 diabetes mellitus without complications: Secondary | ICD-10-CM | POA: Diagnosis not present

## 2024-01-03 DIAGNOSIS — E039 Hypothyroidism, unspecified: Secondary | ICD-10-CM | POA: Diagnosis not present

## 2024-01-12 DIAGNOSIS — M25571 Pain in right ankle and joints of right foot: Secondary | ICD-10-CM | POA: Diagnosis not present

## 2024-01-12 DIAGNOSIS — M65971 Unspecified synovitis and tenosynovitis, right ankle and foot: Secondary | ICD-10-CM | POA: Diagnosis not present

## 2024-01-12 DIAGNOSIS — E1142 Type 2 diabetes mellitus with diabetic polyneuropathy: Secondary | ICD-10-CM | POA: Diagnosis not present

## 2024-01-21 ENCOUNTER — Encounter: Payer: Self-pay | Admitting: Family Medicine

## 2024-01-21 ENCOUNTER — Ambulatory Visit: Admitting: Family Medicine

## 2024-01-21 VITALS — BP 88/62 | HR 77 | Resp 16 | Ht 66.0 in | Wt 188.0 lb

## 2024-01-21 DIAGNOSIS — I201 Angina pectoris with documented spasm: Secondary | ICD-10-CM | POA: Diagnosis not present

## 2024-01-21 DIAGNOSIS — N644 Mastodynia: Secondary | ICD-10-CM | POA: Diagnosis not present

## 2024-01-21 DIAGNOSIS — D649 Anemia, unspecified: Secondary | ICD-10-CM

## 2024-01-21 DIAGNOSIS — E039 Hypothyroidism, unspecified: Secondary | ICD-10-CM

## 2024-01-21 DIAGNOSIS — E1159 Type 2 diabetes mellitus with other circulatory complications: Secondary | ICD-10-CM | POA: Diagnosis not present

## 2024-01-21 DIAGNOSIS — E785 Hyperlipidemia, unspecified: Secondary | ICD-10-CM | POA: Diagnosis not present

## 2024-01-21 DIAGNOSIS — R944 Abnormal results of kidney function studies: Secondary | ICD-10-CM

## 2024-01-21 DIAGNOSIS — E559 Vitamin D deficiency, unspecified: Secondary | ICD-10-CM | POA: Diagnosis not present

## 2024-01-21 DIAGNOSIS — E538 Deficiency of other specified B group vitamins: Secondary | ICD-10-CM

## 2024-01-21 DIAGNOSIS — I152 Hypertension secondary to endocrine disorders: Secondary | ICD-10-CM

## 2024-01-21 DIAGNOSIS — Z86711 Personal history of pulmonary embolism: Secondary | ICD-10-CM | POA: Diagnosis not present

## 2024-01-21 DIAGNOSIS — E1142 Type 2 diabetes mellitus with diabetic polyneuropathy: Secondary | ICD-10-CM

## 2024-01-21 DIAGNOSIS — Z1231 Encounter for screening mammogram for malignant neoplasm of breast: Secondary | ICD-10-CM

## 2024-01-21 DIAGNOSIS — E1169 Type 2 diabetes mellitus with other specified complication: Secondary | ICD-10-CM

## 2024-01-21 DIAGNOSIS — Z87828 Personal history of other (healed) physical injury and trauma: Secondary | ICD-10-CM | POA: Diagnosis not present

## 2024-01-21 DIAGNOSIS — G894 Chronic pain syndrome: Secondary | ICD-10-CM

## 2024-01-21 DIAGNOSIS — I5032 Chronic diastolic (congestive) heart failure: Secondary | ICD-10-CM | POA: Diagnosis not present

## 2024-01-21 DIAGNOSIS — F251 Schizoaffective disorder, depressive type: Secondary | ICD-10-CM

## 2024-01-21 DIAGNOSIS — Z7689 Persons encountering health services in other specified circumstances: Secondary | ICD-10-CM

## 2024-01-21 MED ORDER — ROSUVASTATIN CALCIUM 40 MG PO TABS: 40.0000 mg | ORAL_TABLET | Freq: Every day | ORAL | 0 refills | Status: DC

## 2024-01-21 MED ORDER — EMPAGLIFLOZIN 10 MG PO TABS: 10.0000 mg | ORAL_TABLET | Freq: Every day | ORAL | 1 refills | Status: DC

## 2024-01-21 MED ORDER — BACLOFEN 10 MG PO TABS
ORAL_TABLET | ORAL | 2 refills | Status: DC
Start: 2024-01-21 — End: 2024-04-28

## 2024-01-21 NOTE — Patient Instructions (Signed)
 Please call the [facility] [phone]) to schedule a routine screening mammogram.

## 2024-01-21 NOTE — Progress Notes (Unsigned)
 Transfer of care visit   Patient: Valerie Vazquez   DOB: 1955/11/29   68 y.o. Female  MRN: [id number] Visit Date: 01/21/2024  Today's healthcare provider: Carlean Charter, DO   No chief complaint on file.  Subjective    Valerie Vazquez is a 68 y.o. female who presents today as a new patient transfer care.   HPI    ***  Past Medical History:  Diagnosis Date   Acute right-sided low back pain with right-sided sciatica 10/23/2017   Anemia    Anginal pain (HCC)    Bilateral ovarian cysts    Bilateral renal cysts    Bulimia nervosa 05/07/2021   CAD (coronary artery disease) 09/28/2016   Chest pain 10/14/2015   CHF (congestive heart failure) (HCC)    Diastolic   Chronic back pain    "upper and lower" (09/19/2014)   Chronic shoulder pain (Location of Primary Source of Pain) (Left) 01/29/2016   Colitis 04/08/2016   Colon polyp 01/30/2017   Coronary vasospasm (HCC) 12/09/2014   DDD (degenerative disc disease), cervical    a.) s/p ACDF C5-C7   Depression    DVT (deep venous thrombosis) (HCC)    Esophageal spasm    Essential hypertension 10/28/2013   Gallstones    Gastritis    Gastroenteritis    GERD (gastroesophageal reflux disease)    Heart disease 12/09/2016   High blood pressure    High cholesterol    History of blood transfusion 1985   related to hysterectomy   History of left heart catheterization (LHC) 12/09/2014   a.) LHC 12/09/2014: EF 50%; normal coronaries.   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 Rule out Paget's disease of the bone with blood work.    Hyperlipidemia 10/28/2013   Hypertension    Hypothyroidism    IBS (irritable bowel syndrome)    Long term current use of anticoagulant    a.) apixaban    Lumbar central spinal stenosis (moderate at L4-5; mild at  L2-3 and L3-4) 01/29/2016   Migraine    "a few times/yr" (09/19/2014)   Myocardial infarction (HCC) 10/2010 X 3   "while hospitalized"   Neck pain 01/29/2016   Osteoarthritis    Peripheral vascular disease (HCC)    DVT/PE. On Eliquis    Pleural effusion, bilateral 07/31/2015   Pneumonia 04/2014   PONV (postoperative nausea and vomiting)    PTSD (post-traumatic stress disorder)    Hx. None currently (2024)   Pulmonary embolism (HCC) 04/2001; 09/19/2014   "after gallbladder OR; "   Schizoaffective disorder, depressive type (HCC) 08/28/2016   Sleep apnea    "mild" (09/19/2014)   Snoring    a. sleep study 5/16: No OSA   SOB (shortness of breath) 07/31/2015   Spinal stenosis    Spondylosis    Suicidal ideation 08/27/2016   a.) seen in ED with approx 6 months of reported SI. Reported ingestion of "unknown pills" to end life; last ingestion was 3 days PTA; UDS (+) for TCAs. SI related to depression, friend (loss of friend), and social determinants   Type II diabetes mellitus (HCC)    "dx'd in 2007; lost weight; no RX for ~ 4 yr now" (09/19/2014)   Past Surgical History:  Procedure Laterality Date   ANTERIOR CERVICAL  DECOMP/DISCECTOMY FUSION  10/2012   in [place]  C5-C7    APPENDECTOMY  1985   BACK SURGERY     BREAST CYST EXCISION Right    BREAST EXCISIONAL BIOPSY Right YRS AGO   NEG   CARDIAC CATHETERIZATION   2000's; 2009   CHOLECYSTECTOMY  2002   COLONOSCOPY WITH PROPOFOL  N/A 12/12/2016   Procedure: COLONOSCOPY WITH PROPOFOL ;  Surgeon: Luke Salaam, MD;  Location: [facility] ENDOSCOPY;  Service: Endoscopy;  Laterality: N/A;   COLONOSCOPY WITH PROPOFOL  N/A 02/17/2017   Procedure: COLONOSCOPY WITH PROPOFOL ;  Surgeon: Luke Salaam, MD;  Location: [facility] ENDOSCOPY;  Service: Gastroenterology;  Laterality: N/A;   COLONOSCOPY WITH PROPOFOL  N/A 11/04/2021   Procedure: COLONOSCOPY WITH PROPOFOL ;  Surgeon: Luke Salaam, MD;  Location: [facility] ENDOSCOPY;  Service: Gastroenterology;  Laterality: N/A;    CYSTOSCOPY N/A 05/15/2022   Procedure: CYSTOSCOPY;  Surgeon: Kris Pester, MD;  Location: [facility] ORS;  Service: Gynecology;  Laterality: N/A;   ESOPHAGOGASTRODUODENOSCOPY (EGD) WITH PROPOFOL  N/A 02/17/2017   Procedure: ESOPHAGOGASTRODUODENOSCOPY (EGD) WITH PROPOFOL ;  Surgeon: Luke Salaam, MD;  Location[facility] ENDOSCOPY;  Service: Gastroenterology;  Laterality: N/A;   ESOPHAGOGASTRODUODENOSCOPY (EGD) WITH PROPOFOL  N/A 03/07/2019   Procedure: ESOPHAGOGASTRODUODENOSCOPY (EGD) WITH PROPOFOL ;  Surgeon: Luke Salaam, MD;  Location: [facility] ENDOSCOPY;  Service: Gastroenterology;  Laterality: N/A;   EXCISION/RELEASE BURSA HIP Right 12/1984   HERNIA REPAIR  1985   IVC FILTER REMOVAL     2020s   LEFT HEART CATHETERIZATION WITH CORONARY ANGIOGRAM N/A 12/09/2014   Procedure: LEFT HEART CATHETERIZATION WITH CORONARY ANGIOGRAM;  Surgeon: Odie Benne, MD;  Location: [facility] CATH LAB;  Service: Cardiovascular;  Laterality: N/A;   PARTIAL HYSTERECTOMY  1985   ROBOTIC ASSISTED BILATERAL SALPINGO OOPHERECTOMY Bilateral 05/15/2022   Procedure: XI ROBOTIC ASSISTED BILATERAL OOPHORECTOMY;  Surgeon: Kris Pester, MD;  Location: [facility] ORS;  Service: Gynecology;  Laterality: Bilateral;   TONSILLECTOMY AND ADENOIDECTOMY  ~ 1968   TOTAL HIP ARTHROPLASTY Left 04/06/2014   UMBILICAL HERNIA REPAIR  1985   VENA CAVA FILTER PLACEMENT  03/2014   Family Status  Relation Name Status   Mother  Deceased   Father  Deceased   Daughter  (Not Specified)   Son  (Not Specified)   Son  (Not Specified)   Sister  Alive   Brother  Alive   Brother  Alive   Sister  Alive   Sister  Alive   Neg Hx  (Not Specified)  No partnership data on file   Family History  Problem Relation Age of Onset   Stroke Mother        Deceased   Lung cancer Father        Deceased   Healthy Daughter    Healthy Son        x 2   Other Son        Suicide   Cancer Brother    Heart attack Neg Hx    Social History   Socioeconomic  History   Marital status: Divorced    Spouse name: Not on file   Number of children: Not on file   Years of education: Not on file   Highest education level: Associate degree: occupational, Scientist, product/process development, or vocational program  Occupational History   Not on file  Tobacco Use   Smoking status: Former    Current packs/day: 0.00    Average packs/day: 1.5 packs/day for 10.0 years (15.0 ttl pk-yrs)    Types: Cigarettes    Start date: 04/11/1969  Quit date: 04/12/1979    Years since quitting: 44.8   Smokeless tobacco: Never  Vaping Use   Vaping status: Never Used  Substance and Sexual Activity   Alcohol use: No    Alcohol/week: 0.0 standard drinks of alcohol   Drug use: No   Sexual activity: Not Currently  Other Topics Concern   Not on file  Social History Narrative   Lives with fiancee in an apartment on the first floor.  Has 3 grown children.  One child passed away.  On disability 1995/02/21 - 2000, 2007 - current.     Education: some college.   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 (06/11/2023)   Overall Financial Resource Strain (CARDIA)    Difficulty of Paying Living Expenses: Very hard  Food Insecurity: No Food Insecurity (07/27/2023)   Hunger Vital Sign    Worried About Running Out of Food in the Last Year: Never true    Ran Out of Food in the Last Year: Never true  Recent Concern: Food Insecurity - Food Insecurity Present (07/27/2023)   Hunger Vital Sign    Worried About Running Out of Food in the Last Year: Never true    Ran Out of Food in the Last Year: Sometimes true  Transportation Needs: No Transportation Needs (07/27/2023)   PRAPARE - Administrator, Civil Service (Medical): No    Lack of Transportation (Non-Medical): No  Recent Concern: Transportation Needs - Unmet Transportation Needs (05/19/2023)   PRAPARE - Transportation    Lack of Transportation (Medical): Yes    Lack of Transportation  (Non-Medical): Yes  Physical Activity: Inactive (06/11/2023)   Exercise Vital Sign    Days of Exercise per Week: 0 days    Minutes of Exercise per Session: 0 min  Stress: No Stress Concern Present (06/11/2023)   [employer] of Occupational Health - Occupational Stress Questionnaire    Feeling of Stress : Only a little  Social Connections: Moderately Integrated (06/11/2023)   Social Connection and Isolation Panel [NHANES]    Frequency of Communication with Friends and Family: More than three times a week    Frequency of Social Gatherings with Friends and Family: More than three times a week    Attends Religious Services: More than 4 times per year    Active Member of [employer] or Organizations: Yes    Attends Engineer, structural: More than 4 times per year    Marital Status: Divorced   Outpatient Medications Prior to Visit  Medication Sig   acetaminophen  (TYLENOL ) 500 MG tablet Take 1 tablet (500 mg total) by mouth every 6 (six) hours as needed.   albuterol  (VENTOLIN  HFA) 108 (90 Base) MCG/ACT inhaler TAKE 2 PUFFS BY MOUTH EVERY 6 HOURS AS NEEDED FOR WHEEZE OR SHORTNESS OF BREATH   apixaban  (ELIQUIS ) 5 MG TABS tablet Take 1 tablet (5 mg total) by mouth 2 (two) times daily.   baclofen  (LIORESAL ) 10 MG tablet TAKE ONE TABLET BY MOUTH THREE TIMES DAILY AS NEEDED FOR MUSCLE SPASMS (VIAL)   blood glucose meter kit and supplies KIT Dispense based on patient and insurance preference. Use once daily fasting. Dx code E11.9.   Blood Glucose Monitoring Suppl (GHT BLOOD GLUCOSE MONITOR) w/Device KIT DISPENSE BASED ON PATIENT AND INSURANCE PREFERENCE. USE ONCE DAILY AS DIRECTED. DX CODE E11.9.   cyanocobalamin  (VITAMIN B12) 1000 MCG tablet Take 1,000 mcg by mouth daily. Gummy   dicyclomine  (BENTYL )  20 MG tablet TAKE ONE TABLET BY MOUTH THREE TIMES DAILY AS NEEDED FOR ABDOMINAL SPASMS (VIAL)   escitalopram  (LEXAPRO ) 20 MG tablet TAKE ONE TABLET BY MOUTH DAILY AT 9AM   estradiol  (ESTRACE ) 0.1  MG/GM vaginal cream Place vaginally at bedtime. 1 application placed on outside (Patient not taking: Reported on 09/21/2023)   famotidine  (PEPCID ) 20 MG tablet TAKE 1 TABLET BY MOUTH EVERYDAY AT BEDTIME   ferrous sulfate  325 (65 FE) MG tablet Take 325 mg by mouth daily with breakfast.   fluticasone  (FLONASE ) 50 MCG/ACT nasal spray SPRAY 2 SPRAYS INTO EACH NOSTRIL EVERY DAY   furosemide  (LASIX ) 20 MG tablet TAKE ONE TABLET BY MOUTH DAILY AS NEEDED FOR EDEMA (VIAL)   gabapentin  (NEURONTIN ) 600 MG tablet TAKE ONE TABLET BY MOUTH DAILY AT 9AM and TAKE 1 AND 1/2 TABLETS BY MOUTH DAILY AT 9PM (VIAL) (Patient taking differently: Take 600-900 mg by mouth See admin instructions. TAKE ONE TABLET BY MOUTH DAILY AT 9AM and TAKE 1 AND 1/2 TABLETS BY MOUTH DAILY AT 9PM (VIAL))   isosorbide  mononitrate (IMDUR ) 30 MG 24 hr tablet Take 0.5 tablets (15 mg total) by mouth daily.   JARDIANCE  25 MG TABS tablet TAKE 1 TABLET (25 MG TOTAL) BY MOUTH DAILY.   levothyroxine  (SYNTHROID ) 25 MCG tablet TAKE ONE TABLET BY MOUTH DAILY AT 7AM BEFORE BREAKFAST AND 2-3 HOURS BEFORE TAKING ANY OTHER MEDICATION OR SUPPLEMENT   Magnesium  200 MG TABS Take 200 mg by mouth daily.   Multiple Vitamins-Minerals (MULTIVITAMIN GUMMIES ADULT) CHEW Chew 2 each by mouth daily.   nitroGLYCERIN  (NITROSTAT ) 0.4 MG SL tablet Place 1 tablet (0.4 mg total) under the tongue every 5 (five) minutes as needed for chest pain.   OLANZapine  (ZYPREXA ) 10 MG tablet TAKE 1 TABLET BY MOUTH EVERYDAY AT BEDTIME   ondansetron  (ZOFRAN -ODT) 4 MG disintegrating tablet TAKE 1 TABLET BY MOUTH EVERY 6 HOURS AS NEEDED FOR NAUSEA   OVER THE COUNTER MEDICATION Take 2 each by mouth daily. Power C gummies   pantoprazole  (PROTONIX ) 40 MG tablet TAKE 1 TABLET BY MOUTH EVERY DAY   potassium chloride  SA (KLOR-CON  M) 20 MEQ tablet TAKE ONE TABLET BY MOUTH DAILY AS NEEDED TAKE WITH LASIX  (VIAL)   predniSONE  (DELTASONE ) 20 MG tablet Take 2 tablets (40 mg total) by mouth daily with  breakfast.   Probiotic Product (PROBIOTIC BLEND) CAPS Take 1 capsule by mouth daily.   rosuvastatin  (CRESTOR ) 40 MG tablet TAKE 1 TABLET BY MOUTH EVERY DAY (Patient taking differently: Take 40 mg by mouth at bedtime.)   Semaglutide ,0.25 or 0.5MG /DOS, (OZEMPIC , 0.25 OR 0.5 MG/DOSE,) 2 MG/3ML SOPN INJECT 0.5 MG INTO THE SKIN ONE TIME PER WEEK   sucralfate  (CARAFATE ) 1 g tablet TAKE 1 TABLET (1 G TOTAL) BY MOUTH 3 TIMES A DAY BEFORE MEALS   traMADol  (ULTRAM ) 50 MG tablet Take 1 tablet (50 mg total) by mouth every 6 (six) hours as needed for moderate pain (pain score 4-6). (Patient not taking: Reported on 09/21/2023)   triamcinolone  cream (KENALOG ) 0.5 % Apply 1 Application topically 2 (two) times daily as needed (irritation).   Zinc  Citrate (ZINC  GUMMY PO) Take 2 each by mouth daily.   No facility-administered medications prior to visit.   Allergies  Allergen Reactions   Other Hives, Itching and Swelling    Hair Dye - Causes severe sleepiness    Abilify [Aripiprazole] Other (See Comments)    Jerking movement, slow motor skills   Augmentin [Amoxicillin-Pot Clavulanate] Diarrhea and Nausea And Vomiting  Bactrim [Sulfamethoxazole-Trimethoprim] Diarrhea   Ceftin [Cefuroxime Axetil] Diarrhea   Clavulanic Acid    Coconut (Cocos Nucifera)     Rash    Diclofenac  Other (See Comments)     Muscle Pain   Indocin [Indomethacin] Other (See Comments)    Migraines    Linaclotide Diarrhea   Lisinopril Diarrhea and Other (See Comments)    Dizziness, Vomiting   Morphine  Other (See Comments)    Chest Tightness   Morphine  And Codeine  Other (See Comments)    Chest tightness    Simvastatin Other (See Comments)    Muscle Pain   Sulfa Antibiotics Diarrhea   Wellbutrin [Bupropion] Other (See Comments)    Jerking movement, slurred speech   Zetia  [Ezetimibe ]     Diarrhea    Seroquel [Quetiapine Fumarate] Palpitations    Immunization History  Administered Date(s) Administered   PNEUMOCOCCAL  CONJUGATE-20 08/27/2021    Health Maintenance  Topic Date Due   DTaP/Tdap/Td (1 - Tdap) Never done   HEMOGLOBIN A1C  12/28/2023   Medicare Annual Wellness (AWV)  02/09/2024   OPHTHALMOLOGY EXAM  08/31/2024 (Originally 04/26/2023)   FOOT EXAM  03/12/2024   INFLUENZA VACCINE  04/01/2024   Diabetic kidney evaluation - Urine ACR  09/20/2024   MAMMOGRAM  09/28/2024   Diabetic kidney evaluation - eGFR measurement  10/07/2024   Colonoscopy  11/04/2024   Pneumonia Vaccine 19+ Years old  Completed   DEXA SCAN  Completed   Hepatitis C Screening  Completed   HPV VACCINES  Aged Out   Meningococcal B Vaccine  Aged Out   COVID-19 Vaccine  Discontinued   Zoster Vaccines- Shingrix  Discontinued    Patient Care Team: Carlean Charter, DO as PCP - General (Family Medicine) Constancia Delton, MD as PCP - Cardiology (Cardiology) Rochell Chroman, RN as Oncology Nurse Navigator  Review of Systems  {Insert previous labs (optional):23779} {See past labs  Heme  Chem  Endocrine  Serology  Results Review (optional):1}   Objective    There were no vitals taken for this visit. {Insert last BP/Wt (optional):23777}{See vitals history (optional):1}   Physical Exam   Depression Screen    09/21/2023   11:11 AM 06/19/2023    9:57 AM 05/20/2023    8:51 AM 03/13/2023    2:20 PM  PHQ 2/9 Scores  PHQ - 2 Score 1 0 1 1  PHQ- 9 Score  2 4 4    No results found for any visits on 01/21/24.  Assessment & Plan     There are no diagnoses linked to this encounter.   ***  No follow-ups on file.     I discussed the assessment and treatment plan with the patient  The patient was provided an opportunity to ask questions and all were answered. The patient agreed with the plan and demonstrated an understanding of the instructions.   The patient was advised to call back or seek an in-person evaluation if the symptoms worsen or if the condition fails to improve as anticipated.    Carlean Charter, DO   [facility] [phone] (phone) [phone] (fax)  [facility]

## 2024-01-22 ENCOUNTER — Ambulatory Visit (INDEPENDENT_AMBULATORY_CARE_PROVIDER_SITE_OTHER): Admitting: Obstetrics

## 2024-01-22 ENCOUNTER — Other Ambulatory Visit (HOSPITAL_COMMUNITY)
Admission: RE | Admit: 2024-01-22 | Discharge: 2024-01-22 | Disposition: A | Discharge status: Home / Self Care | Source: Ambulatory Visit | Attending: Obstetrics | Admitting: Obstetrics

## 2024-01-22 ENCOUNTER — Encounter: Payer: Self-pay | Admitting: Obstetrics

## 2024-01-22 VITALS — BP 106/73 | HR 66 | Ht 66.0 in | Wt 190.0 lb

## 2024-01-22 DIAGNOSIS — N368 Other specified disorders of urethra: Secondary | ICD-10-CM | POA: Diagnosis not present

## 2024-01-22 DIAGNOSIS — R159 Full incontinence of feces: Secondary | ICD-10-CM

## 2024-01-22 DIAGNOSIS — Z87440 Personal history of urinary (tract) infections: Secondary | ICD-10-CM | POA: Diagnosis not present

## 2024-01-22 DIAGNOSIS — N3946 Mixed incontinence: Secondary | ICD-10-CM | POA: Diagnosis not present

## 2024-01-22 DIAGNOSIS — K5900 Constipation, unspecified: Secondary | ICD-10-CM | POA: Diagnosis not present

## 2024-01-22 DIAGNOSIS — R351 Nocturia: Secondary | ICD-10-CM

## 2024-01-22 DIAGNOSIS — N898 Other specified noninflammatory disorders of vagina: Secondary | ICD-10-CM

## 2024-01-22 DIAGNOSIS — N3000 Acute cystitis without hematuria: Secondary | ICD-10-CM | POA: Diagnosis not present

## 2024-01-22 LAB — VITAMIN D 25 HYDROXY (VIT D DEFICIENCY, FRACTURES): Vit D, 25-Hydroxy: 63.8 ng/mL (ref 30.0–100.0)

## 2024-01-22 LAB — CBC
Hematocrit: 40.9 % (ref 34.0–46.6)
Hemoglobin: 13.2 g/dL (ref 11.1–15.9)
MCH: 31.6 pg (ref 26.6–33.0)
MCHC: 32.3 g/dL (ref 31.5–35.7)
MCV: 98 fL — ABNORMAL HIGH (ref 79–97)
Platelets: 288 10*3/uL (ref 150–450)
RBC: 4.18 x10E6/uL (ref 3.77–5.28)
RDW: 13.6 % (ref 11.7–15.4)
WBC: 7.5 10*3/uL (ref 3.4–10.8)

## 2024-01-22 LAB — COMPREHENSIVE METABOLIC PANEL WITH GFR
ALT: 62 IU/L — ABNORMAL HIGH (ref 0–32)
AST: 49 IU/L — ABNORMAL HIGH (ref 0–40)
Albumin: 4.4 g/dL (ref 3.9–4.9)
Alkaline Phosphatase: 112 IU/L (ref 44–121)
BUN/Creatinine Ratio: 10 — ABNORMAL LOW (ref 12–28)
BUN: 10 mg/dL (ref 8–27)
Bilirubin Total: 0.4 mg/dL (ref 0.0–1.2)
CO2: 20 mmol/L (ref 20–29)
Calcium: 9.7 mg/dL (ref 8.7–10.3)
Chloride: 107 mmol/L — ABNORMAL HIGH (ref 96–106)
Creatinine, Ser: 1.01 mg/dL — ABNORMAL HIGH (ref 0.57–1.00)
Globulin, Total: 2.7 g/dL (ref 1.5–4.5)
Glucose: 104 mg/dL — ABNORMAL HIGH (ref 70–99)
Potassium: 4.4 mmol/L (ref 3.5–5.2)
Sodium: 143 mmol/L (ref 134–144)
Total Protein: 7.1 g/dL (ref 6.0–8.5)
eGFR: 60 mL/min/{1.73_m2} (ref >59)

## 2024-01-22 LAB — LIPID PANEL
Chol/HDL Ratio: 2.9 ratio (ref 0.0–4.4)
Cholesterol, Total: 149 mg/dL (ref 100–199)
HDL: 52 mg/dL (ref >39)
LDL Chol Calc (NIH): 82 mg/dL (ref 0–99)
Triglycerides: 80 mg/dL (ref 0–149)
VLDL Cholesterol Cal: 15 mg/dL (ref 5–40)

## 2024-01-22 LAB — POCT URINALYSIS DIP (CLINITEK)
Bilirubin, UA: NEGATIVE
Blood, UA: NEGATIVE
Glucose, UA: 500 mg/dL — AB
Ketones, POC UA: NEGATIVE mg/dL
Leukocytes, UA: NEGATIVE
Nitrite, UA: NEGATIVE
POC PROTEIN,UA: NEGATIVE
Spec Grav, UA: 1.025 (ref 1.010–1.025)
Urobilinogen, UA: 0.2 U/dL
pH, UA: 5.5 (ref 5.0–8.0)

## 2024-01-22 LAB — TSH RFX ON ABNORMAL TO FREE T4: TSH: 2.97 u[IU]/mL (ref 0.450–4.500)

## 2024-01-22 LAB — VITAMIN B12: Vitamin B-12: 1789 pg/mL — ABNORMAL HIGH (ref 232–1245)

## 2024-01-22 LAB — HEMOGLOBIN A1C
Est. average glucose Bld gHb Est-mCnc: 117 mg/dL
Hgb A1c MFr Bld: 5.7 % — ABNORMAL HIGH (ref 4.8–5.6)

## 2024-01-22 LAB — MICROALBUMIN / CREATININE URINE RATIO
Creatinine, Urine: 143.5 mg/dL
Microalb/Creat Ratio: 11 mg/g{creat} (ref 0–29)
Microalbumin, Urine: 15.3 ug/mL

## 2024-01-22 MED ORDER — GEMTESA 75 MG PO TABS: 75.0000 mg | ORAL_TABLET | Freq: Every day | ORAL | 2 refills | Status: DC

## 2024-01-22 MED ORDER — ESTRADIOL 0.1 MG/GM VA CREA: 0.5000 g | TOPICAL_CREAM | VAGINAL | 3 refills | Status: AC

## 2024-01-22 MED ORDER — GEMTESA 75 MG PO TABS: 75.0000 mg | ORAL_TABLET | Freq: Every day | ORAL | Status: DC
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
## 2024-01-22 NOTE — Patient Instructions (Addendum)
 We discussed the symptoms of overactive bladder (OAB), which include urinary urgency, urinary frequency, night-time urination, with or without urge incontinence.  We discussed management including behavioral therapy (decreasing bladder irritants by following a bladder diet, urge suppression strategies, timed voids, bladder retraining), physical therapy, medication; and for refractory cases posterior tibial nerve stimulation, sacral neuromodulation, and intravesical botulinum toxin injection.   For Beta-3 agonist medication, we discussed the potential side effect of elevated blood pressure which is more likely to occur in individuals with uncontrolled hypertension. You were given samples for Gemtesa 75 mg.  It can take a month to start working so give it time, but if you have bothersome side effects call sooner and we can try a different medication.  Call us  if you have trouble filling the prescription or if it's not covered by your insurance.  For treatment of stress urinary incontinence, which is leakage with physical activity/movement/strainging/coughing, we discussed expectant management versus nonsurgical options versus surgery. Nonsurgical options include weight loss, physical therapy, as well as a pessary.  Surgical options include a midurethral sling, which is a synthetic mesh sling that acts like a hammock under the urethra to prevent leakage of urine, a Burch urethropexy, and transurethral injection of a bulking agent.   For vaginal atrophy (thinning of the vaginal tissue that can cause dryness and burning) and UTI prevention we discussed estrogen replacement in the form of vaginal cream.   Start vaginal estrogen therapy nightly for two weeks then 2 times weekly at night. This can be placed with your finger or an applicator inside the vagina and around the urethra.  Please let us  know if the prescription is too expensive and we can look for alternative options.   Is vaginal estrogen therapy safe  for me? Vaginal estrogen preparations act on the vaginal skin, and only a very tiny amount is absorbed into the bloodstream (0.01%).  They work in a similar way to hand or face cream.  There is minimal absorption and they are therefore perfectly safe. If you have had breast cancer and have persistent troublesome symptoms which aren't settling with vaginal moisturisers and lubricants, local estrogen treatment may be a possibility, but consultation with your oncologist should take place first.   Constipation: Our goal is to achieve formed bowel movements daily or every-other-day.  You may need to try different combinations of the following options to find what works best for you - everybody's body works differently so feel free to adjust the dosages as needed.  Some options to help maintain bowel health include:  Dietary changes (more leafy greens, vegetables and fruits; less processed foods) Fiber supplementation (Benefiber, FiberCon, Metamucil or Psyllium). Start slow and increase gradually to full dose. Over-the-counter agents such as: stool softeners (Docusate or Colace) and/or laxatives (Miralax , milk of magnesia)  "Power Pudding" is a natural mixture that may help your constipation.  To make blend 1 cup applesauce, 1 cup wheat bran, and 3/4 cup prune juice, refrigerate and then take 1 tablespoon daily with a large glass of water as needed.   Women should try to eat at least 21 to 25 grams of fiber a day, while men should aim for 30 to 38 grams a day. You can add fiber to your diet with food or a fiber supplement such as psyllium (metamucil), benefiber, or fibercon.   Here's a look at how much dietary fiber is found in some common foods. When buying packaged foods, check the Nutrition Facts label for fiber content. It can  vary among brands.  Fruits Serving size Total fiber (grams)*  Raspberries 1 cup 8.0  Pear 1 medium 5.5  Apple, with skin 1 medium 4.5  Banana 1 medium 3.0  Orange 1 medium  3.0  Strawberries 1 cup 3.0   Vegetables Serving size Total fiber (grams)*  Green peas, boiled 1 cup 9.0  Broccoli, boiled 1 cup chopped 5.0  Turnip greens, boiled 1 cup 5.0  Brussels sprouts, boiled 1 cup 4.0  Potato, with skin, baked 1 medium 4.0  Sweet corn, boiled 1 cup 3.5  Cauliflower, raw 1 cup chopped 2.0  Carrot, raw 1 medium 1.5   Grains Serving size Total fiber (grams)*  Spaghetti, whole-wheat, cooked 1 cup 6.0  Barley, pearled, cooked 1 cup 6.0  Bran flakes 3/4 cup 5.5  Quinoa, cooked 1 cup 5.0  Oat bran muffin 1 medium 5.0  Oatmeal, instant, cooked 1 cup 5.0  Popcorn, air-popped 3 cups 3.5  Brown rice, cooked 1 cup 3.5  Bread, whole-wheat 1 slice 2.0  Bread, rye 1 slice 2.0   Legumes, nuts and seeds Serving size Total fiber (grams)*  Split peas, boiled 1 cup 16.0  Lentils, boiled 1 cup 15.5  Black beans, boiled 1 cup 15.0  Baked beans, canned 1 cup 10.0  Chia seeds 1 ounce 10.0  Almonds 1 ounce (23 nuts) 3.5  Pistachios 1 ounce (49 nuts) 3.0  Sunflower kernels 1 ounce 3.0  *Rounded to nearest 0.5 gram. Source: [employer] for Standard Reference, Legacy Release    Please call gastroenterology to follow-up on treatment for IBS-C.

## 2024-01-23 DIAGNOSIS — R351 Nocturia: Secondary | ICD-10-CM | POA: Insufficient documentation

## 2024-01-23 DIAGNOSIS — N898 Other specified noninflammatory disorders of vagina: Secondary | ICD-10-CM | POA: Insufficient documentation

## 2024-01-23 DIAGNOSIS — N368 Other specified disorders of urethra: Secondary | ICD-10-CM | POA: Insufficient documentation

## 2024-01-23 NOTE — Assessment & Plan Note (Addendum)
-   history of recurrent vaginal discharge - vaginal atrophy noted on exam, encouraged consistency low dose vaginal estrogen use - pending Nuswab to r/o infectious etiology - no pooling of urine or stool noted

## 2024-01-23 NOTE — Assessment & Plan Note (Signed)
-   started after back surgery - urgency > stress - We discussed the symptoms of overactive bladder (OAB), which include urinary urgency, urinary frequency, nocturia, with or without urge incontinence.  While we do not know the exact etiology of OAB, several treatment options exist. We discussed management including behavioral therapy (decreasing bladder irritants, urge suppression strategies, timed voids, bladder retraining), physical therapy, medication; for refractory cases posterior tibial nerve stimulation, sacral neuromodulation, and intravesical botulinum toxin injection.  For anticholinergic medications, we discussed the potential side effects of anticholinergics including dry eyes, dry mouth, constipation, cognitive impairment and urinary retention. For Beta-3 agonist medication, we discussed the potential side effect of elevated blood pressure which is more likely to occur in individuals with uncontrolled hypertension. - samples and Rx for trial of gemtesa - encouraged to maintain optimal glycemic control - encouraged consistent low dose vaginal estrogen use - For treatment of stress urinary incontinence,  non-surgical options include expectant management, weight loss, physical therapy, as well as a pessary.  Surgical options include a midurethral sling, Burch urethropexy, and transurethral injection of a bulking agent.

## 2024-01-25 ENCOUNTER — Encounter: Payer: Self-pay | Admitting: Family Medicine

## 2024-01-26 ENCOUNTER — Ambulatory Visit: Payer: Self-pay | Admitting: Obstetrics

## 2024-01-26 LAB — CERVICOVAGINAL ANCILLARY ONLY
Bacterial Vaginitis (gardnerella): NEGATIVE
Candida Glabrata: NEGATIVE
Candida Vaginitis: POSITIVE — AB
Comment: NEGATIVE
Comment: NEGATIVE
Comment: NEGATIVE

## 2024-01-26 NOTE — Assessment & Plan Note (Signed)
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

## 2024-01-26 NOTE — Assessment & Plan Note (Addendum)
-   type IV stool with VI-VII stool after daily enema use - prior use of Linzess - lower abdominal pain and pelvic floor myofascial pain on exam - For constipation, we reviewed the importance of a better bowel regimen.  We also discussed the importance of avoiding chronic straining, as it can exacerbate her pelvic floor symptoms; we discussed treating constipation and straining prior to surgery, as postoperative straining can lead to damage to the repair and recurrence of symptoms. We discussed initiating therapy with increasing fluid intake, fiber supplementation, stool softeners, and laxatives such as miralax .  - encouraged to follow-up with GI regarding secretagogue use  - titrate miralax  until stool consistency until GI appt - discussed association of pelvic floor relaxation to improve slow transit constipation. Encouraged squatting position for defecation - encouraged to consider referral to pelvic floor PT

## 2024-01-26 NOTE — Assessment & Plan Note (Signed)
-   history of sleep apnea and insomnia without CPAP use - leg swelling due to diastolic heart failure - avoid fluid intake 3 hours before bedtime - elevated feet during the day or use compression socks to reduce lower extremity swelling - switch diuretic (e.g. furosemide ) dosing to 2pm - encouraged CPAP for sleep apnea

## 2024-01-26 NOTE — Assessment & Plan Note (Addendum)
-   For treatment of recurrent urinary tract infections, we discussed management of recurrent UTIs including prophylaxis with a daily low dose antibiotic, transvaginal estrogen therapy, D-mannose, and cranberry supplements.  We discussed the role of diagnostic testing such as cystoscopy and upper tract imaging.   - discussed low dose vaginal estrogen use - encouraged repeat testing if clinical change - continue probiotic use

## 2024-01-26 NOTE — Assessment & Plan Note (Addendum)
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   - referral to GI to re-establish care - encouarged titration of miralax  to minimize [place] VI-VII stool with enema use - prior use of Linzess - encouraged to consider pelvic floor PT

## 2024-01-28 ENCOUNTER — Ambulatory Visit: Payer: Self-pay | Admitting: Family Medicine

## 2024-01-28 ENCOUNTER — Other Ambulatory Visit: Payer: Self-pay | Admitting: Medical Genetics

## 2024-01-28 NOTE — Telephone Encounter (Signed)
 FYI please see the pt message response below

## 2024-01-30 DIAGNOSIS — E119 Type 2 diabetes mellitus without complications: Secondary | ICD-10-CM | POA: Diagnosis not present

## 2024-01-30 DIAGNOSIS — E039 Hypothyroidism, unspecified: Secondary | ICD-10-CM | POA: Diagnosis not present

## 2024-02-01 DIAGNOSIS — E119 Type 2 diabetes mellitus without complications: Secondary | ICD-10-CM | POA: Diagnosis not present

## 2024-02-01 DIAGNOSIS — K2100 Gastro-esophageal reflux disease with esophagitis, without bleeding: Secondary | ICD-10-CM | POA: Diagnosis not present

## 2024-02-01 DIAGNOSIS — Z5982 Transportation insecurity: Secondary | ICD-10-CM | POA: Diagnosis not present

## 2024-02-01 DIAGNOSIS — K582 Mixed irritable bowel syndrome: Secondary | ICD-10-CM | POA: Diagnosis not present

## 2024-02-01 DIAGNOSIS — E038 Other specified hypothyroidism: Secondary | ICD-10-CM | POA: Diagnosis not present

## 2024-02-02 DIAGNOSIS — E039 Hypothyroidism, unspecified: Secondary | ICD-10-CM | POA: Diagnosis not present

## 2024-02-02 DIAGNOSIS — E119 Type 2 diabetes mellitus without complications: Secondary | ICD-10-CM | POA: Diagnosis not present

## 2024-02-04 ENCOUNTER — Other Ambulatory Visit: Payer: Self-pay | Admitting: Internal Medicine

## 2024-02-04 DIAGNOSIS — E119 Type 2 diabetes mellitus without complications: Secondary | ICD-10-CM

## 2024-02-05 ENCOUNTER — Encounter: Payer: Self-pay | Admitting: Family Medicine

## 2024-02-05 ENCOUNTER — Ambulatory Visit: Admitting: Family Medicine

## 2024-02-05 VITALS — BP 96/65 | HR 86 | Resp 16 | Ht 66.0 in | Wt 190.5 lb

## 2024-02-05 DIAGNOSIS — L989 Disorder of the skin and subcutaneous tissue, unspecified: Secondary | ICD-10-CM | POA: Diagnosis not present

## 2024-02-05 DIAGNOSIS — I5032 Chronic diastolic (congestive) heart failure: Secondary | ICD-10-CM | POA: Diagnosis not present

## 2024-02-05 DIAGNOSIS — G8929 Other chronic pain: Secondary | ICD-10-CM

## 2024-02-05 DIAGNOSIS — R7401 Elevation of levels of liver transaminase levels: Secondary | ICD-10-CM

## 2024-02-05 DIAGNOSIS — I201 Angina pectoris with documented spasm: Secondary | ICD-10-CM

## 2024-02-05 DIAGNOSIS — E1169 Type 2 diabetes mellitus with other specified complication: Secondary | ICD-10-CM

## 2024-02-05 DIAGNOSIS — M545 Low back pain, unspecified: Secondary | ICD-10-CM

## 2024-02-05 MED ORDER — TRAMADOL HCL 50 MG PO TABS: 50.0000 mg | ORAL_TABLET | Freq: Two times a day (BID) | ORAL | 0 refills | Status: DC | PRN

## 2024-02-05 MED ORDER — SEMAGLUTIDE (1 MG/DOSE) 4 MG/3ML ~~LOC~~ SOPN: 1.0000 mg | PEN_INJECTOR | SUBCUTANEOUS | 2 refills | Status: DC

## 2024-02-05 NOTE — Patient Instructions (Addendum)
 Schedule cardiology appointment with Dr. Junnie Olives to ask about potential to stop isosorbide  mononitrate given low blood pressures and dizziness/lightheadedness, especially given your history of coronary vasospasm.

## 2024-02-05 NOTE — Progress Notes (Signed)
 Established patient visit   Patient: Valerie Vazquez   DOB: Oct 19, 1955   68 y.o. Female  MRN: [id number] Visit Date: 02/05/2024  Today's healthcare provider: Carlean Charter, DO   Chief Complaint  Patient presents with   Follow-up   Subjective    HPI CECILA Vazquez is a 68 year old female with diabetes and hypertension who presents with concerns about elevated liver enzymes and musculoskeletal pain.  She experiences persistent soreness in her lower back, attributed to increased physical activity following surgery in November. Activities such as moving around and putting up groceries contribute to the discomfort. She has not undergone physical therapy post-surgery. She has weaned off wearing a back brace, but her back remains tender.  Her right ankle remains bothersome despite receiving a cortisone shot from a podiatrist. The ankle feels 'irritable', causing concern about its condition.  She is concerned about her liver function tests, which show slightly elevated levels. She does not consume alcohol and has been taking a generic arthritis formula similar to Tylenol , recently reducing from daily use of two 650 mg pills to less frequent use, after her liver enzymes resulted. She switched from vitamin B12 to a B complex supplement after noticing that her B12 was elevated.  She is on Ozempic  for diabetes management, currently at a dose of 0.5 mg once a week. Her blood sugar levels are usually below 100 mg/dL, but she experienced a spike to 226 mg/dL recently. Her weight has plateaued between 188 and 190 pounds.  She experiences orthostatic hypotension, requiring caution when turning or bending over to avoid dizziness and potential falls. She takes isosorbide  mononitrate, Jardiance , and Lasix , which may contribute to her blood pressure fluctuations.  She has a history of vasospasms and experiences spasms in her throat and intestines, causing difficulty swallowing and discomfort. She  takes dicyclomine  to manage these symptoms, which helps but causes drowsiness.  She will be seeing GI soon for this.  She has a dermatology appointment scheduled for September 17th to evaluate what she describes as a cascading lesion near her eye; she states it began as a boil and developed into multiple pockets.     Medications: Outpatient Medications Prior to Visit  Medication Sig   acetaminophen  (TYLENOL ) 500 MG tablet Take 1 tablet (500 mg total) by mouth every 6 (six) hours as needed. (Patient taking differently: Take 650 mg by mouth in the morning and at bedtime. As needed)   albuterol  (VENTOLIN  HFA) 108 (90 Base) MCG/ACT inhaler TAKE 2 PUFFS BY MOUTH EVERY 6 HOURS AS NEEDED FOR WHEEZE OR SHORTNESS OF BREATH   apixaban  (ELIQUIS ) 5 MG TABS tablet Take 1 tablet (5 mg total) by mouth 2 (two) times daily.   baclofen  (LIORESAL ) 10 MG tablet TAKE ONE TABLET BY MOUTH THREE TIMES DAILY AS NEEDED FOR MUSCLE SPASMS (VIAL)   blood glucose meter kit and supplies KIT Dispense based on patient and insurance preference. Use once daily fasting. Dx code E11.9.   Blood Glucose Monitoring Suppl (GHT BLOOD GLUCOSE MONITOR) w/Device KIT DISPENSE BASED ON PATIENT AND INSURANCE PREFERENCE. USE ONCE DAILY AS DIRECTED. DX CODE E11.9.   cyanocobalamin  (VITAMIN B12) 1000 MCG tablet Take 1,000 mcg by mouth daily. Gummy   dicyclomine  (BENTYL ) 20 MG tablet TAKE ONE TABLET BY MOUTH THREE TIMES DAILY AS NEEDED FOR ABDOMINAL SPASMS (VIAL)   empagliflozin  (JARDIANCE ) 10 MG TABS tablet Take 1 tablet (10 mg total) by mouth daily before breakfast.   escitalopram  (LEXAPRO ) 20 MG  tablet TAKE ONE TABLET BY MOUTH DAILY AT 9AM   estradiol  (ESTRACE ) 0.1 MG/GM vaginal cream Place 0.5 g vaginally 2 (two) times a week. Place 0.5g nightly for two weeks then twice a week after   famotidine  (PEPCID ) 20 MG tablet TAKE 1 TABLET BY MOUTH EVERYDAY AT BEDTIME   ferrous sulfate  325 (65 FE) MG tablet Take 325 mg by mouth daily with breakfast.    fluticasone  (FLONASE ) 50 MCG/ACT nasal spray SPRAY 2 SPRAYS INTO EACH NOSTRIL EVERY DAY   furosemide  (LASIX ) 20 MG tablet TAKE ONE TABLET BY MOUTH DAILY AS NEEDED FOR EDEMA (VIAL)   gabapentin  (NEURONTIN ) 600 MG tablet TAKE ONE TABLET BY MOUTH DAILY AT 9AM and TAKE 1 AND 1/2 TABLETS BY MOUTH DAILY AT 9PM (VIAL) (Patient taking differently: Take 600-900 mg by mouth See admin instructions. TAKE ONE TABLET BY MOUTH DAILY AT 9AM and TAKE 1 AND 1/2 TABLETS BY MOUTH DAILY AT 9PM (VIAL))   isosorbide  mononitrate (IMDUR ) 30 MG 24 hr tablet Take 0.5 tablets (15 mg total) by mouth daily.   levothyroxine  (SYNTHROID ) 25 MCG tablet TAKE ONE TABLET BY MOUTH DAILY AT 7AM BEFORE BREAKFAST AND 2-3 HOURS BEFORE TAKING ANY OTHER MEDICATION OR SUPPLEMENT   Magnesium  200 MG TABS Take 200 mg by mouth daily.   Multiple Vitamins-Minerals (MULTIVITAMIN GUMMIES ADULT) CHEW Chew 2 each by mouth daily.   nitroGLYCERIN  (NITROSTAT ) 0.4 MG SL tablet Place 1 tablet (0.4 mg total) under the tongue every 5 (five) minutes as needed for chest pain.   OLANZapine  (ZYPREXA ) 10 MG tablet TAKE 1 TABLET BY MOUTH EVERYDAY AT BEDTIME   ondansetron  (ZOFRAN -ODT) 4 MG disintegrating tablet TAKE 1 TABLET BY MOUTH EVERY 6 HOURS AS NEEDED FOR NAUSEA   OVER THE COUNTER MEDICATION Take 2 each by mouth daily. Power C gummies   pantoprazole  (PROTONIX ) 40 MG tablet TAKE 1 TABLET BY MOUTH EVERY DAY   potassium chloride  SA (KLOR-CON  M) 20 MEQ tablet TAKE ONE TABLET BY MOUTH DAILY AS NEEDED TAKE WITH LASIX  (VIAL)   Probiotic Product (PROBIOTIC BLEND) CAPS Take 1 capsule by mouth daily.   rosuvastatin  (CRESTOR ) 40 MG tablet Take 1 tablet (40 mg total) by mouth at bedtime.   sucralfate  (CARAFATE ) 1 g tablet TAKE 1 TABLET (1 G TOTAL) BY MOUTH 3 TIMES A DAY BEFORE MEALS   triamcinolone  cream (KENALOG ) 0.5 % Apply 1 Application topically 2 (two) times daily as needed (irritation).   Vibegron  (GEMTESA ) 75 MG TABS Take 1 tablet (75 mg total) by mouth daily.    Zinc  Citrate (ZINC  GUMMY PO) Take 2 each by mouth daily.   [DISCONTINUED] Semaglutide ,0.25 or 0.5MG /DOS, (OZEMPIC , 0.25 OR 0.5 MG/DOSE,) 2 MG/3ML SOPN INJECT 0.5 MG INTO THE SKIN ONE TIME PER WEEK   [DISCONTINUED] traMADol  (ULTRAM ) 50 MG tablet Take 1 tablet (50 mg total) by mouth every 6 (six) hours as needed for moderate pain (pain score 4-6).   [DISCONTINUED] Vibegron  (GEMTESA ) 75 MG TABS Take 1 tablet (75 mg total) by mouth daily.   No facility-administered medications prior to visit.        Objective    BP 96/65 (BP Location: Left Arm, Patient Position: Sitting, Cuff Size: Normal)   Pulse 86   Resp 16   Ht 5\' 6"  (1.676 m)   Wt 190 lb 8 oz (86.4 kg)   SpO2 98%   BMI 30.75 kg/m     Physical Exam Vitals and nursing note reviewed.  Constitutional:      General: She is not  in acute distress.    Appearance: Normal appearance.  HENT:     Head: Normocephalic and atraumatic.  Eyes:     General: No scleral icterus.    Conjunctiva/sclera: Conjunctivae normal.  Cardiovascular:     Rate and Rhythm: Normal rate.  Pulmonary:     Effort: Pulmonary effort is normal.  Neurological:     Mental Status: She is alert and oriented to person, place, and time. Mental status is at baseline.  Psychiatric:        Mood and Affect: Mood normal.        Behavior: Behavior normal.      No results found for any visits on 02/05/24.  Assessment & Plan    Chronic bilateral low back pain, unspecified whether sciatica present -     Ambulatory referral to Physical Therapy -     traMADol  HCl; Take 1 tablet (50 mg total) by mouth every 12 (twelve) hours as needed for moderate pain (pain score 4-6).  Dispense: 40 tablet; Refill: 0  Skin lesion -     Ambulatory referral to Dermatology  Elevated transaminase level -     Hepatic function panel  Type 2 diabetes mellitus with other specified complication, without long-term current use of insulin  (HCC) -     Semaglutide  (1 MG/DOSE); Inject 1 mg as  directed once a week.  Dispense: 3 mL; Refill: 2  Coronary vasospasm (HCC)  Chronic diastolic heart failure (HCC)       Chronic Low Back Pain Pain likely exacerbated by increased activity post-surgery. Lack of physical therapy and weaning off back brace may contribute. - Refer to physical therapy for strengthening exercises. - Provide home exercises for abdominal/back strengthening.  Orthostatic Hypotension; coronary vasospasm; chronic diastolic heart failure Dizziness and lightheadedness likely due to medication. Risks of stopping isosorbide  include increased recurrence of coronary vasospasm.  Lasix  needed to manage heart failure symptoms. - Continue Lasix  20 mg daily and isosorbide  mononitrate 15 mg daily. - Contact cardiology to discuss adjusting or stopping isosorbide  and/or adjusting Lasix  dose if feasible. - Monitor for dizziness and lightheadedness.  Type 2 Diabetes Mellitus Recent blood sugar spike to 226 mg/dL. Considering increasing Ozempic  due to weight plateau and spike. - Increase Ozempic  dose to 0.5 mg weekly. - Follow up in a couple of months to assess blood sugar control and A1c levels.  Elevated transaminase level Mild elevation possibly due to acetaminophen  use. Advised to reduce acetaminophen . - Recheck liver function tests.  Vitamin B12 elevation - Decrease vitamin B12 supplementation - Monitor vitamin B levels.  General Health Maintenance Due for mammogram, dermatology referral needed for skin lesion evaluation, and considering eye exam due to diabetes. - Ensure mammogram is completed. - Send referral to dermatology. - Consider scheduling an eye exam.  Follow-up Plans for various conditions and referrals. Cardiology follow-up planned for October. - Follow up in a couple of months to reassess diabetes management and A1c levels. - Attend scheduled follow-up with GI specialist   Return in about 3 months (around 04/25/2024) for DM, Chronic f/u.      I  discussed the assessment and treatment plan with the patient  The patient was provided an opportunity to ask questions and all were answered. The patient agreed with the plan and demonstrated an understanding of the instructions.   The patient was advised to call back or seek an in-person evaluation if the symptoms worsen or if the condition fails to improve as anticipated.    Karyn Brull N Rhythm Gubbels, DO  [facility] [phone] (phone) [phone] (fax)  [facility]

## 2024-02-10 ENCOUNTER — Telehealth: Payer: Self-pay

## 2024-02-10 ENCOUNTER — Ambulatory Visit

## 2024-02-10 DIAGNOSIS — E119 Type 2 diabetes mellitus without complications: Secondary | ICD-10-CM

## 2024-02-10 DIAGNOSIS — Z599 Problem related to housing and economic circumstances, unspecified: Secondary | ICD-10-CM

## 2024-02-10 DIAGNOSIS — Z0000 Encounter for general adult medical examination without abnormal findings: Secondary | ICD-10-CM | POA: Diagnosis not present

## 2024-02-10 NOTE — Progress Notes (Signed)
 Subjective:   Valerie Vazquez is a 68 y.o. who presents for a Medicare Wellness preventive visit.  As a reminder, Annual Wellness Visits don't include a physical exam, and some assessments may be limited, especially if this visit is performed virtually. We may recommend an in-person follow-up visit with your provider if needed.  Visit Complete: Virtual I connected with  Valerie Vazquez on 02/10/24 by a audio enabled telemedicine application and verified that I am speaking with the correct person using two identifiers.  Patient Location: Home  Provider Location: Home Office  I discussed the limitations of evaluation and management by telemedicine. The patient expressed understanding and agreed to proceed.  Vital Signs: Because this visit was a virtual/telehealth visit, some criteria may be missing or patient reported. Any vitals not documented were not able to be obtained and vitals that have been documented are patient reported.  VideoDeclined- This patient declined Librarian, academic. Therefore the visit was completed with audio only.  Persons Participating in Visit: Patient.  AWV Questionnaire: No: Patient Medicare AWV questionnaire was not completed prior to this visit.  Cardiac Risk Factors include: advanced age (>87men, >73 women);dyslipidemia;hypertension;diabetes mellitus;sedentary lifestyle;obesity (BMI >30kg/m2)     Objective:     Today's Vitals   02/10/24 0847  PainSc: 4    There is no height or weight on file to calculate BMI.     02/10/2024    8:58 AM 10/08/2023   11:33 AM 07/27/2023    6:31 PM 07/27/2023    6:00 PM 07/22/2023   11:46 AM 06/16/2023    9:55 AM 05/30/2023   11:01 PM  Advanced Directives  Does Patient Have a Medical Advance Directive? No No  No No No No  Would patient like information on creating a medical advance directive? No - Patient declined No - Patient declined No - Patient declined  No - Patient declined       Current Medications (verified) Outpatient Encounter Medications as of 02/10/2024  Medication Sig   albuterol  (VENTOLIN  HFA) 108 (90 Base) MCG/ACT inhaler TAKE 2 PUFFS BY MOUTH EVERY 6 HOURS AS NEEDED FOR WHEEZE OR SHORTNESS OF BREATH   apixaban  (ELIQUIS ) 5 MG TABS tablet Take 1 tablet (5 mg total) by mouth 2 (two) times daily.   b complex vitamins capsule Take 1 capsule by mouth daily.   baclofen  (LIORESAL ) 10 MG tablet TAKE ONE TABLET BY MOUTH THREE TIMES DAILY AS NEEDED FOR MUSCLE SPASMS (VIAL)   blood glucose meter kit and supplies KIT Dispense based on patient and insurance preference. Use once daily fasting. Dx code E11.9.   Blood Glucose Monitoring Suppl (GHT BLOOD GLUCOSE MONITOR) w/Device KIT DISPENSE BASED ON PATIENT AND INSURANCE PREFERENCE. USE ONCE DAILY AS DIRECTED. DX CODE E11.9.   dicyclomine  (BENTYL ) 20 MG tablet TAKE ONE TABLET BY MOUTH THREE TIMES DAILY AS NEEDED FOR ABDOMINAL SPASMS (VIAL)   empagliflozin  (JARDIANCE ) 10 MG TABS tablet Take 1 tablet (10 mg total) by mouth daily before breakfast.   escitalopram  (LEXAPRO ) 20 MG tablet TAKE ONE TABLET BY MOUTH DAILY AT 9AM   estradiol  (ESTRACE ) 0.1 MG/GM vaginal cream Place 0.5 g vaginally 2 (two) times a week. Place 0.5g nightly for two weeks then twice a week after   famotidine  (PEPCID ) 20 MG tablet TAKE 1 TABLET BY MOUTH EVERYDAY AT BEDTIME   ferrous sulfate  325 (65 FE) MG tablet Take 325 mg by mouth daily with breakfast.   fluticasone  (FLONASE ) 50 MCG/ACT nasal spray SPRAY 2 SPRAYS  INTO EACH NOSTRIL EVERY DAY   furosemide  (LASIX ) 20 MG tablet TAKE ONE TABLET BY MOUTH DAILY AS NEEDED FOR EDEMA (VIAL)   gabapentin  (NEURONTIN ) 600 MG tablet TAKE ONE TABLET BY MOUTH DAILY AT 9AM and TAKE 1 AND 1/2 TABLETS BY MOUTH DAILY AT 9PM (VIAL) (Patient taking differently: Take 600-900 mg by mouth See admin instructions. TAKE ONE TABLET BY MOUTH DAILY AT 9AM and TAKE 1 AND 1/2 TABLETS BY MOUTH DAILY AT 9PM (VIAL))   isosorbide  mononitrate  (IMDUR ) 30 MG 24 hr tablet Take 0.5 tablets (15 mg total) by mouth daily.   levothyroxine  (SYNTHROID ) 25 MCG tablet TAKE ONE TABLET BY MOUTH DAILY AT 7AM BEFORE BREAKFAST AND 2-3 HOURS BEFORE TAKING ANY OTHER MEDICATION OR SUPPLEMENT   Magnesium  200 MG TABS Take 200 mg by mouth daily.   Multiple Vitamins-Minerals (MULTIVITAMIN GUMMIES ADULT) CHEW Chew 2 each by mouth daily.   nitroGLYCERIN  (NITROSTAT ) 0.4 MG SL tablet Place 1 tablet (0.4 mg total) under the tongue every 5 (five) minutes as needed for chest pain.   OLANZapine  (ZYPREXA ) 10 MG tablet TAKE 1 TABLET BY MOUTH EVERYDAY AT BEDTIME   ondansetron  (ZOFRAN -ODT) 4 MG disintegrating tablet TAKE 1 TABLET BY MOUTH EVERY 6 HOURS AS NEEDED FOR NAUSEA   OVER THE COUNTER MEDICATION Take 2 each by mouth daily. Power C gummies   pantoprazole  (PROTONIX ) 40 MG tablet TAKE 1 TABLET BY MOUTH EVERY DAY   potassium chloride  SA (KLOR-CON  M) 20 MEQ tablet TAKE ONE TABLET BY MOUTH DAILY AS NEEDED TAKE WITH LASIX  (VIAL)   rosuvastatin  (CRESTOR ) 40 MG tablet Take 1 tablet (40 mg total) by mouth at bedtime.   Semaglutide , 1 MG/DOSE, 4 MG/3ML SOPN Inject 1 mg as directed once a week.   sucralfate  (CARAFATE ) 1 g tablet TAKE 1 TABLET (1 G TOTAL) BY MOUTH 3 TIMES A DAY BEFORE MEALS   triamcinolone  cream (KENALOG ) 0.5 % Apply 1 Application topically 2 (two) times daily as needed (irritation).   Vibegron  (GEMTESA ) 75 MG TABS Take 1 tablet (75 mg total) by mouth daily.   Zinc  Citrate (ZINC  GUMMY PO) Take 2 each by mouth daily.   acetaminophen  (TYLENOL ) 500 MG tablet Take 1 tablet (500 mg total) by mouth every 6 (six) hours as needed. (Patient not taking: Reported on 02/10/2024)   cyanocobalamin  (VITAMIN B12) 1000 MCG tablet Take 1,000 mcg by mouth daily. Gummy (Patient not taking: Reported on 02/10/2024)   Probiotic Product (PROBIOTIC BLEND) CAPS Take 1 capsule by mouth daily. (Patient not taking: Reported on 02/10/2024)   traMADol  (ULTRAM ) 50 MG tablet Take 1 tablet (50 mg  total) by mouth every 12 (twelve) hours as needed for moderate pain (pain score 4-6). (Patient not taking: Reported on 02/10/2024)   No facility-administered encounter medications on file as of 02/10/2024.    Allergies (verified) Other, Abilify [aripiprazole], Augmentin [amoxicillin-pot clavulanate], Bactrim [sulfamethoxazole-trimethoprim], Ceftin [cefuroxime axetil], Clavulanic acid, Coconut (cocos nucifera), Diclofenac , Indocin [indomethacin], Linaclotide, Lisinopril, Morphine , Morphine  and codeine , Simvastatin, Sulfa antibiotics, Wellbutrin [bupropion], Zetia  [ezetimibe ], and Seroquel [quetiapine fumarate]   History: Past Medical History:  Diagnosis Date   Acute right-sided low back pain with right-sided sciatica 10/23/2017   Anemia    Anginal pain (HCC)    Bilateral ovarian cysts    Bilateral renal cysts    Bulimia nervosa 05/07/2021   CAD (coronary artery disease) 09/28/2016   Chest pain 10/14/2015   CHF (congestive heart failure) (HCC)    Diastolic   Chronic back pain    upper and  lower (09/19/2014)   Chronic shoulder pain (Location of Primary Source of Pain) (Left) 01/29/2016   Colitis 04/08/2016   Colon polyp 01/30/2017   Coronary vasospasm (HCC) 12/09/2014   DDD (degenerative disc disease), cervical    a.) s/p ACDF C5-C7   Depression    DVT (deep venous thrombosis) (HCC)    Esophageal spasm    Essential hypertension 10/28/2013   Gallstones    Gastritis    Gastroenteritis    GERD (gastroesophageal reflux disease)    Heart disease 12/09/2016   High blood pressure    High cholesterol    History of blood transfusion 1985   related to hysterectomy   History of left heart catheterization (LHC) 12/09/2014   a.) LHC 12/09/2014: EF 50%; normal coronaries.   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 Rule out Paget's disease of the bone with blood work.    Hyperlipidemia 10/28/2013   Hypertension    Hypothyroidism    IBS (irritable bowel syndrome)    Long term current use of anticoagulant    a.) apixaban    Lumbar central spinal stenosis (moderate at L4-5; mild at L2-3 and L3-4) 01/29/2016   Migraine    a few times/yr (09/19/2014)   Myocardial infarction (HCC) 10/2010 X 3   while hospitalized   Neck pain 01/29/2016   Osteoarthritis    Peripheral vascular disease (HCC)    DVT/PE. On Eliquis    Pleural effusion, bilateral 07/31/2015   Pneumonia 04/2014   PONV (postoperative nausea and vomiting)    PTSD (post-traumatic stress disorder)    Hx. None currently (2024)   Pulmonary embolism (HCC) 04/2001; 09/19/2014   after gallbladder OR;    Schizoaffective disorder, depressive type (HCC) 08/28/2016   Sleep apnea    mild (09/19/2014)   Snoring    a. sleep study 5/16: No OSA   SOB (shortness of breath) 07/31/2015   Spinal stenosis    Spondylosis    Suicidal ideation 08/27/2016   a.) seen in ED with approx 6 months of reported SI. Reported ingestion of unknown pills to end life; last ingestion was 3 days PTA; UDS (+) for TCAs. SI related to depression, friend (loss of friend), and social determinants   Type II diabetes mellitus (HCC)    dx'd in 2007; lost weight; no RX for ~ 4 yr now (09/19/2014)   Past Surgical History:  Procedure Laterality Date   ANTERIOR CERVICAL DECOMP/DISCECTOMY FUSION  10/2012   in [place]  C5-C7    APPENDECTOMY  1985   BACK SURGERY     BREAST CYST EXCISION Right    BREAST EXCISIONAL BIOPSY Right YRS AGO   NEG   CARDIAC CATHETERIZATION   2000's; 2009   CHOLECYSTECTOMY  2002   COLONOSCOPY WITH PROPOFOL  N/A 12/12/2016   Procedure: COLONOSCOPY WITH PROPOFOL ;  Surgeon: Luke Salaam, MD;  Location: [facility] ENDOSCOPY;  Service: Endoscopy;  Laterality: N/A;   COLONOSCOPY WITH PROPOFOL  N/A 02/17/2017   Procedure: COLONOSCOPY WITH  PROPOFOL ;  Surgeon: Luke Salaam, MD;  Location: [facility] ENDOSCOPY;  Service: Gastroenterology;  Laterality: N/A;   COLONOSCOPY WITH PROPOFOL  N/A 11/04/2021   Procedure: COLONOSCOPY WITH PROPOFOL ;  Surgeon: Luke Salaam, MD;  Location: [facility] ENDOSCOPY;  Service: Gastroenterology;  Laterality: N/A;   CYSTOSCOPY N/A 05/15/2022   Procedure: CYSTOSCOPY;  Surgeon: Cleora Daft,  Charity Conch, MD;  Location: [facility] ORS;  Service: Gynecology;  Laterality: N/A;   ESOPHAGOGASTRODUODENOSCOPY (EGD) WITH PROPOFOL  N/A 02/17/2017   Procedure: ESOPHAGOGASTRODUODENOSCOPY (EGD) WITH PROPOFOL ;  Surgeon: Luke Salaam, MD;  Location[facility] ENDOSCOPY;  Service: Gastroenterology;  Laterality: N/A;   ESOPHAGOGASTRODUODENOSCOPY (EGD) WITH PROPOFOL  N/A 03/07/2019   Procedure: ESOPHAGOGASTRODUODENOSCOPY (EGD) WITH PROPOFOL ;  Surgeon: Luke Salaam, MD;  Location: [facility] ENDOSCOPY;  Service: Gastroenterology;  Laterality: N/A;   EXCISION/RELEASE BURSA HIP Right 12/1984   HERNIA REPAIR  1985   IVC FILTER REMOVAL     2020s   LEFT HEART CATHETERIZATION WITH CORONARY ANGIOGRAM N/A 12/09/2014   Procedure: LEFT HEART CATHETERIZATION WITH CORONARY ANGIOGRAM;  Surgeon: Odie Benne, MD;  Location: [facility] CATH LAB;  Service: Cardiovascular;  Laterality: N/A;   PARTIAL HYSTERECTOMY  1985   ROBOTIC ASSISTED BILATERAL SALPINGO OOPHERECTOMY Bilateral 05/15/2022   Procedure: XI ROBOTIC ASSISTED BILATERAL OOPHORECTOMY;  Surgeon: Kris Pester, MD;  Location: [facility] ORS;  Service: Gynecology;  Laterality: Bilateral;   SPINE SURGERY  07/2023   TONSILLECTOMY AND ADENOIDECTOMY  ~ 1968   TOTAL HIP ARTHROPLASTY Left 04/06/2014   UMBILICAL HERNIA REPAIR  1985   VENA CAVA FILTER PLACEMENT  03/2014   Family History  Problem Relation Age of Onset   Stroke Mother        Deceased   Lung cancer Father        Deceased   Healthy Daughter    Healthy Son        x 2   Other Son        Suicide   Cancer Brother    Heart attack Neg Hx    Social History    Socioeconomic History   Marital status: Divorced    Spouse name: Not on file   Number of children: Not on file   Years of education: Not on file   Highest education level: Associate degree: occupational, Scientist, product/process development, or vocational program  Occupational History   Not on file  Tobacco Use   Smoking status: Former    Current packs/day: 0.00    Average packs/day: 1.5 packs/day for 10.0 years (15.0 ttl pk-yrs)    Types: Cigarettes    Start date: 04/11/1969    Quit date: 04/12/1979    Years since quitting: 44.8   Smokeless tobacco: Never  Vaping Use   Vaping status: Never Used  Substance and Sexual Activity   Alcohol use: No    Alcohol/week: 0.0 standard drinks of alcohol   Drug use: No   Sexual activity: Not Currently  Other Topics Concern   Not on file  Social History Narrative   Lives with fiancee in an apartment on the first floor.  Has 3 grown children.  One child passed away.  On disability February 26, 1995 - 2000, 2007 - current.     Education: some college.   Caffeine rarely   retired   Teacher, early years/pre Strain: Medium Risk (02/10/2024)   Overall Financial Resource Strain (CARDIA)    Difficulty of Paying Living Expenses: Somewhat hard  Food Insecurity: No Food Insecurity (02/10/2024)   Hunger Vital Sign    Worried About Running Out of Food in the Last Year: Never true    Ran Out of Food in the Last Year: Never true  Transportation Needs: No Transportation Needs (02/10/2024)   PRAPARE - Administrator, Civil Service (Medical): No    Lack of Transportation (Non-Medical): No  Physical Activity: Inactive (02/10/2024)  Exercise Vital Sign    Days of Exercise per Week: 0 days    Minutes of Exercise per Session: 0 min  Stress: No Stress Concern Present (02/10/2024)   [employer] of Occupational Health - Occupational Stress Questionnaire    Feeling of Stress : Not at all  Social Connections: Moderately Integrated (02/10/2024)   Social  Connection and Isolation Panel [NHANES]    Frequency of Communication with Friends and Family: More than three times a week    Frequency of Social Gatherings with Friends and Family: Once a week    Attends Religious Services: More than 4 times per year    Active Member of [employer] or Organizations: Yes    Attends Engineer, structural: More than 4 times per year    Marital Status: Divorced    Tobacco Counseling Counseling given: Not Answered    Clinical Intake:  Pre-visit preparation completed: Yes  Pain : 0-10 Pain Score: 4  Pain Type: Chronic pain Pain Location: Back Pain Orientation: Lower Pain Radiating Towards: HIPS Pain Descriptors / Indicators: Aching, Discomfort, Constant Pain Onset: More than a month ago Pain Frequency: Constant Pain Relieving Factors: REST  Pain Relieving Factors: REST  BMI - recorded: 30.7 Nutritional Status: BMI > 30  Obese Nutritional Risks: None Diabetes: Yes CBG done?: No Did pt. bring in CBG monitor from home?: No  Lab Results  Component Value Date   HGBA1C 5.7 (H) 01/21/2024   HGBA1C 5.6 06/29/2023   HGBA1C 5.6 06/29/2023   HGBA1C 5.6 (A) 06/29/2023   HGBA1C 5.6 06/29/2023     How often do you need to have someone help you when you read instructions, pamphlets, or other written materials from your doctor or pharmacy?: 1 - Never  Interpreter Needed?: No  Information entered by :: Dellie Fergusson, LPN   Activities of Daily Living    02/10/2024    9:00 AM 07/27/2023    6:18 AM  In your present state of health, do you have any difficulty performing the following activities:  Hearing? 0 0  Vision? 0 0  Difficulty concentrating or making decisions? 0 0  Walking or climbing stairs? 1   Comment ANKLE PAIN   Dressing or bathing? 0   Doing errands, shopping? 0   Preparing Food and eating ? N   Using the Toilet? N   In the past six months, have you accidently leaked urine? N   Do you have problems with loss of bowel  control? N   Managing your Medications? N   Managing your Finances? N   Housekeeping or managing your Housekeeping? N     Patient Care Team: Carlean Charter, DO as PCP - General (Family Medicine) Constancia Delton, MD as PCP - Cardiology (Cardiology) Rochell Chroman, RN as Oncology Nurse Navigator Pa, [facility] (Optometry)  I have updated your Care Teams any recent Medical Services you may have received from other providers in the past year.     Assessment:    This is a routine wellness examination for [place].  Hearing/Vision screen Hearing Screening - Comments:: NO AIDS Vision Screening - Comments:: READERS- [facility], NEEDS EXAM    Goals Addressed             This Visit's Progress    Cut out extra servings         Depression Screen     02/10/2024    8:54 AM 02/05/2024    8:12 AM 01/21/2024   10:55 AM 09/21/2023  11:11 AM 06/19/2023    9:57 AM 05/20/2023    8:51 AM 03/13/2023    2:20 PM  PHQ 2/9 Scores  PHQ - 2 Score 0 1 1 1  0 1 1  PHQ- 9 Score 0 4 3  2 4 4     Fall Risk     02/10/2024    8:59 AM 01/21/2024   10:54 AM 09/21/2023   11:10 AM 06/19/2023    9:57 AM 06/16/2023    9:55 AM  Fall Risk   Falls in the past year? 1 1 1 1 1   Number falls in past yr: 0 0 1 1 0  Injury with Fall? 1 1 1 1 1   Risk for fall due to : History of fall(s)  History of fall(s) History of fall(s)   Follow up Falls evaluation completed;Falls prevention discussed  Falls evaluation completed Falls evaluation completed Falls evaluation completed    MEDICARE RISK AT HOME:  Medicare Risk at Home Any stairs in or around the home?: Yes If so, are there any without handrails?: No Home free of loose throw rugs in walkways, pet beds, electrical cords, etc?: Yes Adequate lighting in your home to reduce risk of falls?: Yes Life alert?: No Use of a cane, walker or w/c?: Yes (ROLLATOR SOMETIMES) Grab bars in the bathroom?: Yes Shower chair or bench in shower?: Yes Elevated  toilet seat or a handicapped toilet?: Yes  TIMED UP AND GO:  Was the test performed?  No  Cognitive Function: 6CIT completed    01/22/2018    9:56 AM 01/20/2017    4:43 PM  MMSE - Mini Mental State Exam  Orientation to time    Orientation to Place    Registration    Attention/ Calculation    Recall    Language- name 2 objects    Language- repeat    Language- follow 3 step command    Language- read & follow direction    Write a sentence    Copy design    Total score       Information is confidential and restricted. Go to Review Flowsheets to unlock data.        02/10/2024    9:04 AM 02/09/2023   11:51 AM 01/30/2021   11:39 AM 01/27/2020   11:12 AM 01/25/2019   12:02 PM  6CIT Screen  What Year? 0 points 0 points 0 points 0 points   What month? 0 points 0 points 0 points 0 points   What time? 3 points 0 points 0 points 0 points   Count back from 20 0 points 0 points 0 points    Months in reverse 0 points 0 points 0 points 0 points   Repeat phrase 0 points 0 points 0 points 0 points   Total Score 3 points 0 points 0 points       Information is confidential and restricted. Go to Review Flowsheets to unlock data.    Immunizations Immunization History  Administered Date(s) Administered   PNEUMOCOCCAL CONJUGATE-20 08/27/2021    Screening Tests Health Maintenance  Topic Date Due   DTaP/Tdap/Td (1 - Tdap) Never done   OPHTHALMOLOGY EXAM  08/31/2024 (Originally 04/26/2023)   FOOT EXAM  03/12/2024   INFLUENZA VACCINE  04/01/2024   HEMOGLOBIN A1C  07/23/2024   MAMMOGRAM  09/28/2024   DEXA SCAN  10/18/2024   Colonoscopy  11/04/2024   Diabetic kidney evaluation - eGFR measurement  01/20/2025   Diabetic kidney evaluation - Urine ACR  01/20/2025  Medicare Annual Wellness (AWV)  02/09/2025   Pneumonia Vaccine 80+ Years old  Completed   Hepatitis C Screening  Completed   HPV VACCINES  Aged Out   Meningococcal B Vaccine  Aged Out   COVID-19 Vaccine  Discontinued   Zoster  Vaccines- Shingrix  Discontinued    Health Maintenance  Health Maintenance Due  Topic Date Due   DTaP/Tdap/Td (1 - Tdap) Never done   Health Maintenance Items Addressed: UP TO DATE ON MAMMOGRAM, COLONOSCOPY & BONE DENSITY;  UP TO DATE PNA SHOTS, ALL OTHER SHOTS DECLINED; NEEDS EYE EXAM  Additional Screening:  Vision Screening: Recommended annual ophthalmology exams for early detection of glaucoma and other disorders of the eye. Would you like a referral to an eye doctor? No    Dental Screening: Recommended annual dental exams for proper oral hygiene  Community Resource Referral / Chronic Care Management: CRR required this visit?  Yes   CCM required this visit?  No   Plan:    I have personally reviewed and noted the following in the patient's chart:   Medical and social history Use of alcohol, tobacco or illicit drugs  Current medications and supplements including opioid prescriptions. Patient is not currently taking opioid prescriptions. Functional ability and status Nutritional status Physical activity Advanced directives List of other physicians Hospitalizations, surgeries, and ER visits in previous 12 months Vitals Screenings to include cognitive, depression, and falls Referrals and appointments  In addition, I have reviewed and discussed with patient certain preventive protocols, quality metrics, and best practice recommendations. A written personalized care plan for preventive services as well as general preventive health recommendations were provided to patient.   Pinky Bright, LPN   1/61/0960   After Visit Summary: (MyChart) Due to this being a telephonic visit, the after visit summary with patients personalized plan was offered to patient via MyChart   Notes: SDOH REFERRAL PLACED; DM NUTRITION REFERRAL PLACED

## 2024-02-10 NOTE — Patient Instructions (Addendum)
 Valerie Vazquez , Thank you for taking time out of your busy schedule to complete your Annual Wellness Visit with me. I enjoyed our conversation and look forward to speaking with you again next year. I, as well as your care team,  appreciate your ongoing commitment to your health goals. Please review the following plan we discussed and let me know if I can assist you in the future.   REFERRAL PLACED FOR SOCIAL WORK AND FOR DIABETIC NUTRITION CLASS   Follow up Visits: Next Medicare AWV with our clinical staff:   02/21/25 @ 1:50 PM BY PHONE Have you seen your provider in the last 6 months (3 months if uncontrolled diabetes)? Yes   Clinician Recommendations:  Aim for 30 minutes of exercise or brisk walking, 6-8 glasses of water, and 5 servings of fruits and vegetables each day. TAKE CARE!      This is a list of the screening recommended for you and due dates:  Health Maintenance  Topic Date Due   DTaP/Tdap/Td vaccine (1 - Tdap) Never done   Eye exam for diabetics  08/31/2024*   Complete foot exam   03/12/2024   Flu Shot  04/01/2024   Hemoglobin A1C  07/23/2024   Mammogram  09/28/2024   DEXA scan (bone density measurement)  10/18/2024   Colon Cancer Screening  11/04/2024   Yearly kidney function blood test for diabetes  01/20/2025   Yearly kidney health urinalysis for diabetes  01/20/2025   Medicare Annual Wellness Visit  02/09/2025   Pneumonia Vaccine  Completed   Hepatitis C Screening  Completed   HPV Vaccine  Aged Out   Meningitis B Vaccine  Aged Out   COVID-19 Vaccine  Discontinued   Zoster (Shingles) Vaccine  Discontinued  *Topic was postponed. The date shown is not the original due date.    Advanced directives: (ACP Link)Information on Advanced Care Planning can be found at [place]  Secretary of [facility] Advance Health Care Directives Advance Health Care Directives. [email or web address]  Advance Care Planning is important because it:  [x]  Makes sure you receive the medical care that is  consistent with your values, goals, and preferences  [x]  It provides guidance to your family and loved ones and reduces their decisional burden about whether or not they are making the right decisions based on your wishes.  Follow the link provided in your after visit summary or read over the paperwork we have mailed to you to help you started getting your Advance Directives in place. If you need assistance in completing these, please reach out to us  so that we can help you!

## 2024-02-10 NOTE — Progress Notes (Signed)
 Complex Care Management Note Care Guide Note  02/10/2024 Name: Valerie Vazquez MRN: [id number] DOB: 09/20/1955   Complex Care Management Outreach Attempts: An unsuccessful telephone outreach was attempted today to offer the patient information about available complex care management services.  Follow Up Plan:  Additional outreach attempts will be made to offer the patient complex care management information and services.   Encounter Outcome:  No Answer  Dalya Maselli [facility]  [facility] Guide Direct Dial: [phone]  Fax: [phone] Website: [email or web address]

## 2024-02-11 DIAGNOSIS — M47816 Spondylosis without myelopathy or radiculopathy, lumbar region: Secondary | ICD-10-CM | POA: Diagnosis not present

## 2024-02-11 DIAGNOSIS — M4316 Spondylolisthesis, lumbar region: Secondary | ICD-10-CM | POA: Diagnosis not present

## 2024-02-11 DIAGNOSIS — M5440 Lumbago with sciatica, unspecified side: Secondary | ICD-10-CM | POA: Diagnosis not present

## 2024-02-15 ENCOUNTER — Other Ambulatory Visit
Admission: RE | Admit: 2024-02-15 | Discharge: 2024-02-15 | Disposition: A | Discharge status: Home / Self Care | Payer: Self-pay | Source: Ambulatory Visit | Attending: Family Medicine | Admitting: Family Medicine

## 2024-02-15 ENCOUNTER — Other Ambulatory Visit
Admission: RE | Admit: 2024-02-15 | Discharge: 2024-02-15 | Disposition: A | Discharge status: Home / Self Care | Payer: Self-pay | Source: Ambulatory Visit | Attending: Medical Genetics | Admitting: Medical Genetics

## 2024-02-15 ENCOUNTER — Other Ambulatory Visit: Payer: Self-pay | Admitting: Neurosurgery

## 2024-02-15 ENCOUNTER — Telehealth: Payer: Self-pay

## 2024-02-15 DIAGNOSIS — M5440 Lumbago with sciatica, unspecified side: Secondary | ICD-10-CM

## 2024-02-15 DIAGNOSIS — R7401 Elevation of levels of liver transaminase levels: Secondary | ICD-10-CM | POA: Insufficient documentation

## 2024-02-15 LAB — HEPATIC FUNCTION PANEL
ALT: 60 U/L — ABNORMAL HIGH (ref 0–44)
AST: 52 U/L — ABNORMAL HIGH (ref 15–41)
Albumin: 3.8 g/dL (ref 3.5–5.0)
Alkaline Phosphatase: 84 U/L (ref 38–126)
Bilirubin, Direct: <0.1 mg/dL (ref 0.0–0.2)
Total Bilirubin: 0.3 mg/dL (ref 0.0–1.2)
Total Protein: 7.1 g/dL (ref 6.5–8.1)

## 2024-02-15 NOTE — Progress Notes (Signed)
 Complex Care Management Note Care Guide Note  02/15/2024 Name: Valerie Vazquez MRN: [id number] DOB: 07/10/1956   Complex Care Management Outreach Attempts: A second unsuccessful outreach was attempted today to offer the patient with information about available complex care management services.  Follow Up Plan:  Additional outreach attempts will be made to offer the patient complex care management information and services.   Encounter Outcome:  No Answer  Andriea Hasegawa [facility]  [facility] Guide Direct Dial: [phone]  Fax: [phone] Website: [email or web address]

## 2024-02-16 ENCOUNTER — Telehealth: Payer: Self-pay

## 2024-02-16 ENCOUNTER — Ambulatory Visit: Attending: Family Medicine | Admitting: Physical Therapy

## 2024-02-16 DIAGNOSIS — M5459 Other low back pain: Secondary | ICD-10-CM | POA: Insufficient documentation

## 2024-02-16 DIAGNOSIS — M545 Low back pain, unspecified: Secondary | ICD-10-CM | POA: Insufficient documentation

## 2024-02-16 DIAGNOSIS — G8929 Other chronic pain: Secondary | ICD-10-CM | POA: Diagnosis not present

## 2024-02-16 NOTE — Progress Notes (Signed)
 Complex Care Management Note Care Guide Note  02/16/2024 Name: Valerie Vazquez MRN: [id number] DOB: 07/08/1956  Valerie Vazquez is a 68 y.o. year old female who is a primary care patient of Carlean Charter, DO . The community resource team was consulted for assistance with Financial Difficulties related to utilities.  SDOH screenings and interventions completed:  Yes  Social Drivers of Health From This Encounter   Food Insecurity: No Food Insecurity (02/16/2024)   Hunger Vital Sign    Worried About Running Out of Food in the Last Year: Never true    Ran Out of Food in the Last Year: Never true  Housing: Low Risk  (02/16/2024)   Housing Stability Vital Sign    Unable to Pay for Housing in the Last Year: No    Number of Times Moved in the Last Year: 0    Homeless in the Last Year: No  Financial Resource Strain: Medium Risk (02/16/2024)   Overall Financial Resource Strain (CARDIA)    Difficulty of Paying Living Expenses: Somewhat hard  Transportation Needs: No Transportation Needs (02/16/2024)   PRAPARE - Administrator, Civil Service (Medical): No    Lack of Transportation (Non-Medical): No  Utilities: At Risk (02/16/2024)   Utilities    Threatened with loss of utilities: Yes    SDOH Interventions Today    Flowsheet Row Most Recent Value  SDOH Interventions   Utilities Interventions Other (Comment)  [Spoke to patient about Pension scheme manager program and [employer]. Verified patient's email address [email or web address] to send information. [facility] and [facility] are out of funds.]     Care guide performed the following interventions: Patient provided with information about care guide support team and interviewed to confirm resource needs.  Follow Up Plan:  Client has my name and contact number if she does not receive email.  Encounter Outcome:  Patient Visit Completed  [facility]  [facility] Guide Direct Dial: [phone]  Fax: [phone] Website: [email or web address]

## 2024-02-16 NOTE — Telephone Encounter (Signed)
 Spoke with pt wanting to know more in regards to CT scan for liver? I advised I only see blood work to check her liver, pt wasn't sure if she was also getting a CT scan for her liver. Please advise.

## 2024-02-16 NOTE — Telephone Encounter (Signed)
 Copied from CRM [id number]. Topic: General - Other >> Feb 16, 2024  8:15 AM Emylou G wrote: Reason for CRM: Patient called.. checking to see if liver cat scan has been ordered?

## 2024-02-16 NOTE — Therapy (Signed)
 OUTPATIENT PHYSICAL THERAPY THORACOLUMBAR EVALUATION   Patient Name: Valerie Vazquez MRN: [id number] DOB:06-Sep-1955, 68 y.o., female Today's Date: 02/16/2024  END OF SESSION:  PT End of Session - 02/16/24 1253     Visit Number 1    Number of Visits 24    Date for PT Re-Evaluation 05/10/24    Authorization Type [employer] Medicare 2025    Authorization - Visit Number 1    Authorization - Number of Visits 24    Progress Note Due on Visit 10    PT Start Time 0815    PT Stop Time 0900    PT Time Calculation (min) 45 min    Activity Tolerance Patient limited by pain    Behavior During Therapy [facility] for tasks assessed/performed          Past Medical History:  Diagnosis Date   Acute right-sided low back pain with right-sided sciatica 10/23/2017   Anemia    Anginal pain (HCC)    Bilateral ovarian cysts    Bilateral renal cysts    Bulimia nervosa 05/07/2021   CAD (coronary artery disease) 09/28/2016   Chest pain 10/14/2015   CHF (congestive heart failure) (HCC)    Diastolic   Chronic back pain    upper and lower (09/19/2014)   Chronic shoulder pain (Location of Primary Source of Pain) (Left) 01/29/2016   Colitis 04/08/2016   Colon polyp 01/30/2017   Coronary vasospasm (HCC) 12/09/2014   DDD (degenerative disc disease), cervical    a.) s/p ACDF C5-C7   Depression    DVT (deep venous thrombosis) (HCC)    Esophageal spasm    Essential hypertension 10/28/2013   Gallstones    Gastritis    Gastroenteritis    GERD (gastroesophageal reflux disease)    Heart disease 12/09/2016   High blood pressure    High cholesterol    History of blood transfusion 1985   related to hysterectomy   History of left heart catheterization (LHC) 12/09/2014   a.) LHC 12/09/2014: EF 50%; normal coronaries.   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 Rule out Paget's disease of the bone with blood work.    Hyperlipidemia 10/28/2013   Hypertension    Hypothyroidism    IBS (irritable bowel syndrome)    Long term current use of anticoagulant    a.) apixaban    Lumbar central spinal stenosis (moderate at L4-5; mild at L2-3 and L3-4) 01/29/2016   Migraine    a few times/yr (09/19/2014)   Myocardial infarction (HCC) 10/2010 X 3   while hospitalized   Neck pain 01/29/2016   Osteoarthritis    Peripheral vascular disease (HCC)    DVT/PE. On Eliquis    Pleural effusion, bilateral 07/31/2015   Pneumonia 04/2014   PONV (postoperative nausea and vomiting)    PTSD (post-traumatic stress disorder)    Hx. None currently (2024)   Pulmonary embolism (HCC) 04/2001; 09/19/2014   after gallbladder OR;    Schizoaffective disorder, depressive type (HCC) 08/28/2016   Sleep apnea    mild (09/19/2014)   Snoring    a. sleep study 5/16: No OSA   SOB (shortness of breath) 07/31/2015   Spinal stenosis    Spondylosis    Suicidal ideation 08/27/2016   a.) seen in ED with approx 6 months  of reported SI. Reported ingestion of unknown pills to end life; last ingestion was 3 days PTA; UDS (+) for TCAs. SI related to depression, friend (loss of friend), and social determinants   Type II diabetes mellitus (HCC)    dx'd in 2007; lost weight; no RX for ~ 4 yr now (09/19/2014)   Past Surgical History:  Procedure Laterality Date   ANTERIOR CERVICAL DECOMP/DISCECTOMY FUSION  10/2012   in [place]  C5-C7    APPENDECTOMY  1985   BACK SURGERY     BREAST CYST EXCISION Right    BREAST EXCISIONAL BIOPSY Right YRS AGO   NEG   CARDIAC CATHETERIZATION   2000's; 2009   CHOLECYSTECTOMY  2002   COLONOSCOPY WITH PROPOFOL  N/A 12/12/2016   Procedure: COLONOSCOPY WITH PROPOFOL ;  Surgeon: Luke Salaam, MD;  Location: [facility] ENDOSCOPY;  Service: Endoscopy;  Laterality: N/A;   COLONOSCOPY WITH PROPOFOL  N/A 02/17/2017   Procedure:  COLONOSCOPY WITH PROPOFOL ;  Surgeon: Luke Salaam, MD;  Location: [facility] ENDOSCOPY;  Service: Gastroenterology;  Laterality: N/A;   COLONOSCOPY WITH PROPOFOL  N/A 11/04/2021   Procedure: COLONOSCOPY WITH PROPOFOL ;  Surgeon: Luke Salaam, MD;  Location: [facility] ENDOSCOPY;  Service: Gastroenterology;  Laterality: N/A;   CYSTOSCOPY N/A 05/15/2022   Procedure: CYSTOSCOPY;  Surgeon: Kris Pester, MD;  Location: [facility] ORS;  Service: Gynecology;  Laterality: N/A;   ESOPHAGOGASTRODUODENOSCOPY (EGD) WITH PROPOFOL  N/A 02/17/2017   Procedure: ESOPHAGOGASTRODUODENOSCOPY (EGD) WITH PROPOFOL ;  Surgeon: Luke Salaam, MD;  Location: [facility] ENDOSCOPY;  Service: Gastroenterology;  Laterality: N/A;   ESOPHAGOGASTRODUODENOSCOPY (EGD) WITH PROPOFOL  N/A 03/07/2019   Procedure: ESOPHAGOGASTRODUODENOSCOPY (EGD) WITH PROPOFOL ;  Surgeon: Luke Salaam, MD;  Location: [facility] ENDOSCOPY;  Service: Gastroenterology;  Laterality: N/A;   EXCISION/RELEASE BURSA HIP Right 12/1984   HERNIA REPAIR  1985   IVC FILTER REMOVAL     2020s   LEFT HEART CATHETERIZATION WITH CORONARY ANGIOGRAM N/A 12/09/2014   Procedure: LEFT HEART CATHETERIZATION WITH CORONARY ANGIOGRAM;  Surgeon: Odie Benne, MD;  Location: [facility] CATH LAB;  Service: Cardiovascular;  Laterality: N/A;   PARTIAL HYSTERECTOMY  1985   ROBOTIC ASSISTED BILATERAL SALPINGO OOPHERECTOMY Bilateral 05/15/2022   Procedure: XI ROBOTIC ASSISTED BILATERAL OOPHORECTOMY;  Surgeon: Kris Pester, MD;  Location: [facility] ORS;  Service: Gynecology;  Laterality: Bilateral;   SPINE SURGERY  07/2023   TONSILLECTOMY AND ADENOIDECTOMY  ~ 1968   TOTAL HIP ARTHROPLASTY Left 04/06/2014   UMBILICAL HERNIA REPAIR  1985   VENA CAVA FILTER PLACEMENT  03/2014   Patient Active Problem List   Diagnosis Date Noted   Vaginal discharge 01/23/2024   Urethral prolapse 01/23/2024   Nocturia 01/23/2024   Incontinence of feces 01/22/2024   History of recurrent UTI (urinary tract infection) 01/22/2024    Decreased glomerular filtration rate (GFR) 01/21/2024   Influenza A 10/12/2023   Spondylolisthesis at L4-L5 level 07/27/2023   Left leg numbness 05/20/2023   Thoracic back pain 03/13/2023   Obesity (BMI 30.0-34.9) 12/10/2022   Subcutaneous nodule 06/09/2022   Numbness of right foot 03/05/2022   OAB (overactive bladder) 03/05/2022   Ingrown fingernail 12/27/2021   Cramps, extremity 11/27/2021   Skin lesion of lower extremity- right knee  09/26/2021   Retention cyst of nasal sinus 08/27/2021   Rash 08/27/2021   Bulimia 01/14/2021   Cervical radiculitis 11/15/2020   Right arm pain 11/12/2020   Bilateral low back pain without sciatica 06/20/2020   Paronychia of great toe of right foot 04/05/2020   Paronychia of great toe, right 02/27/2020   Foraminal  stenosis of cervical region 01/31/2020   Cervical fusion syndrome (C5-C7 2014) 01/31/2020   Ingrown toenail 01/27/2020   Chronic diastolic heart failure (HCC) 01/27/2020   Postmenopausal estrogen deficiency 08/12/2019   Left wrist pain 05/20/2019   Allergic rhinitis 01/27/2019   Pain of right lower extremity 10/06/2018   Mixed stress and urge urinary incontinence 10/27/2017   Elevated alkaline phosphatase level 10/27/2017   Hypothyroidism 08/26/2017   Type 2 diabetes mellitus with other specified complication (HCC) 05/29/2017   Right upper quadrant pain 01/12/2017   Plantar fasciitis of left foot 11/07/2016   CAD (coronary artery disease) 09/28/2016   Epigastric discomfort 09/16/2016   Schizoaffective disorder, depressive type (HCC) 08/28/2016   Cervicalgia 01/29/2016   Chronic low back pain (Location of Secondary source of pain) (Bilateral) (R>L) 01/29/2016   Chronic pain syndrome 01/29/2016   Primary osteoarthritis of right hip 01/29/2016   Lumbar central spinal stenosis (moderate at L4-5; mild at L2-3 and L3-4) 01/29/2016   Breast pain 10/19/2015   Abdominal pain 10/14/2015   Cough 07/31/2015   Esophageal spasm 06/05/2015    History of DVT (deep vein thrombosis) 02/25/2015   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 Judyann Number    REFERRING PROVIDER: Dr. Gearl Keens   REFERRING DIAG: M54.40 (ICD-10-CM) - Lumbago of lumbar region with sciatica   Rationale for Evaluation and Treatment: Rehabilitation  THERAPY DIAG:  Other low back pain  ONSET DATE: 12/01/23  SUBJECTIVE:                                                                                                                                                                                           SUBJECTIVE STATEMENT: See pertinent history    PERTINENT HISTORY:  Pt reports that had lumbar spinal fusion in November of 2024 and she has since developed soreness and pain in her low back that is similar to what she was feeling before the surgery. She has also had surgery on her cervical spine in September of last year and she has had a left hip replacement. She has since weaned off Tylenol  because of liver issues, so her pain is not as well controlled and she is seeking further medical treatment and has an appointment scheduled with her physician. She is also concerned  about her liver function and she is following up with physician for  CT scan.   PAIN:  Are you having pain? Yes: NPRS scale: 6/10  Pain location: Central spinous process of low back and wraps around anteriorly towards ASIS  Pain description: Dull pain and fatigue when standing and walking for long periods of time   Aggravating factors: Walking around and laying down  Relieving factors: Sitting Down, but not for  a long time.   PRECAUTIONS: None  RED FLAGS: None   WEIGHT BEARING RESTRICTIONS: No  FALLS:  Has patient fallen in last 6 months? No, she had a fall in September of last year when standing up from bed because she could not feel her left foot and she lost her balance.   LIVING ENVIRONMENT: Lives with: lives alone Lives in: House/apartment Stairs: Yes: External: 3 steps; on right going up Has following equipment at home: Single point cane, Walker - 2 wheeled, Wheelchair (manual), shower chair, Tour manager, and Grab bars  OCCUPATION: Not currently working.   PLOF: Independent  PATIENT GOALS: She is limited in doing housework activities because of her low back pain. She is unable to clean up her house as a result.   NEXT MD VISIT: Doctor will schedule apt after her CT scan of low back and liver   OBJECTIVE:  Note: Objective measures were completed at Evaluation unless otherwise noted.  VITALS: BP 116/74HR 72 Sp02 98  DIAGNOSTIC FINDINGS:  CLINICAL DATA:  Chronic low back pain, right leg pain for few years   EXAM: MRI LUMBAR SPINE WITHOUT CONTRAST   TECHNIQUE: Multiplanar, multisequence MR imaging of the lumbar spine was performed. No intravenous contrast was administered.   COMPARISON:  12/25/2021, 06/21/2020   FINDINGS: Segmentation:  Standard.   Alignment:  Minimal grade 1 anterolisthesis of L4 on L5.   Vertebrae: No acute fracture, evidence of discitis, or aggressive bone lesion.   Conus medullaris and cauda equina: Conus extends to the L1-2 level. Conus and cauda equina appear normal.   Paraspinal and other soft tissues: No acute paraspinal abnormality.   Disc levels:   Disc spaces: Degenerative disease with disc height loss at L4-5.   T12-L1: No significant disc bulge. No neural foraminal stenosis. No central canal stenosis.   L1-L2: Mild broad-based disc bulge. Moderate bilateral facet arthropathy. No foraminal or central canal stenosis.   L2-L3:  Minimal broad-based disc bulge. Severe bilateral facet arthropathy. Moderate spinal stenosis. Right lateral recess stenosis. Mild right foraminal stenosis. No left foraminal stenosis.   L3-L4: Mild broad-based disc bulge. Severe bilateral facet arthropathy. Severe spinal stenosis. Mild right foraminal stenosis. No left foraminal stenosis.   L4-L5: Broad-based disc bulge. Severe bilateral facet arthropathy with ligamentum flavum infolding. Moderate-severe spinal stenosis. Moderate right foraminal stenosis. No left foraminal stenosis. Bilateral lateral recess stenosis.   L5-S1: Mild broad-based disc bulge. Severe bilateral facet arthropathy. No foraminal or central canal stenosis.   IMPRESSION: 1. Diffuse lumbar spine spondylosis as described above. 2. No acute osseous injury of the lumbar spine.     Electronically Signed   By: Onnie Bilis M.D.   On: 06/10/2023 09:52  PATIENT SURVEYS:  Modified Oswestry NT    COGNITION: Overall cognitive status: Within functional limits for tasks assessed     SENSATION: Light touch: Impaired  Neuropathy in bilateral feet    MUSCLE LENGTH: Hamstrings: Right 70 deg; Left 70 deg   POSTURE: No Significant postural limitations  PALPATION: L2-L5 CSP   LUMBAR ROM:  AROM eval  Flexion 50%*  Extension 50%*  Right lateral flexion 50%*  Left lateral flexion 50%*  Right rotation 100%  Left rotation 100%   (Blank rows = not tested)  LOWER EXTREMITY ROM:     Active  Right eval Left eval  Hip flexion    Hip extension    Hip abduction    Hip adduction    Hip internal rotation    Hip external rotation    Knee flexion    Knee extension    Ankle dorsiflexion    Ankle plantarflexion    Ankle inversion    Ankle eversion     (Blank rows = not tested)  LOWER EXTREMITY MMT:    MMT Right eval Left eval  Hip flexion 4- 4-   Hip extension 3- 3-  Hip abduction    Hip adduction    Hip internal rotation NT  NT   Hip external  rotation NT NT   Knee flexion 4 4  Knee extension 4 4  Ankle dorsiflexion 4 4  Ankle plantarflexion    Ankle inversion    Ankle eversion     (Blank rows = not tested)  Abdominal Strength Testing: Sahrmann Testing    LUMBAR SPECIAL TESTS:  Straight leg raise test: Negative, SI Compression/distraction test: NT , FABER test: Positive, and FADIR Positive Bilateral   FUNCTIONAL TESTS:  5 times sit to stand: NT  30 seconds chair stand test 10 meter walk test: NT     MODI: 20/50=40%  TREATMENT DATE:   02/16/24: Prone Quad Stretch 2 X 60 sec   Lower Trunk Rotation 2 x 10 with 3 sec hold                  -Pt reports increased low back pain  Seated Forward Lean Center, Left, Right 3 x 60 sec                                                           -Pt reports increased low back pain when going into extension from terminal lumbar flexion     PATIENT EDUCATION:  Education details: Form and technique for correct performance of exercise.  Person educated: Patient Education method: Explanation, Demonstration, Verbal cues, and Handouts Education comprehension: verbalized understanding, returned demonstration, and verbal cues required  HOME EXERCISE PROGRAM: Access Code: [email or web address] URL: [email or web address] Date: 02/16/2024 Prepared by: Marge Shed  Exercises - Seated Lumbar Flexion Stretch  - 1 x daily - 7 x weekly - 3 reps - 30 sec  hold - Prone Quad Stretch with Towel Roll and Strap  - 1 x daily - 7 x weekly - 3 reps - 30-60 sec hold  ASSESSMENT:  CLINICAL IMPRESSION: Patient is a 68 y.o. AA female who was seen today for physical therapy evaluation and treatment for low back pain following a lumbar surgery back in November 2024. She shows signs and symptoms that make her most appropriate for the symptom modulation treatment group with moderate to high pain and high disability. It is not clear what directional preference she has at this point as she experiences  increased low back pain with lumbar flexion and extension. Her deficits include decreased hip flexibility, postural abnormalities with excessive lumbar lordosis, decreased hip strength, and increased low back pain. She will benefit  from skilled PT to address these aforementioned deficits to return to standing and walking for prolonged of time to return to washing dishes and cleaning her home.   OBJECTIVE IMPAIRMENTS: Abnormal gait, decreased activity tolerance, decreased balance, decreased endurance, decreased mobility, difficulty walking, decreased ROM, decreased strength, hypomobility, increased muscle spasms, impaired flexibility, impaired sensation, obesity, and pain.   ACTIVITY LIMITATIONS: carrying, lifting, bending, sitting, standing, squatting, sleeping, stairs, bathing, dressing, hygiene/grooming, and locomotion level  PARTICIPATION LIMITATIONS: meal prep, cleaning, laundry, driving, shopping, and community activity  PERSONAL FACTORS: Age, Fitness, Time since onset of injury/illness/exacerbation, and 3+ comorbidities: HTN, HLD, Depression, Obesity, and T2DM are also affecting patient's functional outcome.   REHAB POTENTIAL: Fair chronicity of condition and prior interventions including low back surgery  CLINICAL DECISION MAKING: Evolving/moderate complexity  EVALUATION COMPLEXITY: Moderate   GOALS: Goals reviewed with patient? No  SHORT TERM GOALS: Target date: 03/01/2024  Patient will demonstrate undestanding of home exercise plan by performing exercises correctly with evidence of good carry over with min to no verbal or tactile cues .   Baseline: NT  Goal status: INITIAL  2.  Patient will show decreased risk of falling as evidence by decrease in self-selected gait speed by >= 0.12 m/sec.  Baseline: NT Goal status: INITIAL    LONG TERM GOALS: Target date: 05/11/2024  Patient will improve modified Oswestry Disability Index (MODI) score by >=13 points as evidence of the minimal  statistically significant change for improvement with low back pain disability and improvement in low back function (Copay et al, 2008) Baseline: 40% Goal status: INITIAL  2.  Patient will improve hip strength by 1/3 grade (I.E 4- to 4+) and abdominal strength by >= 1 grade for improved lumbar stability and to assist low back in absorbing mechanical forces in weight bearing positions for improved symptom response. Baseline: Hip MMT Flex R/L 4-/4-, Hip Ext R/L 3-/3-  Goal status: INITIAL   3.  Patient will perform 5 x STS in <12.6 sec to demonstrate improve LE strength to decrease risk of falling (Bohannon, 2006). Baseline: NT   Goal status: INITIAL  4.  Patient will perform in >=1.0 m./sec as evidence of improved mobility, decreased falls risk, and overall functional independence.  Baseline: NT  Goal status: INITIAL  5.  Patient will demonstrate improved low back pain by being able to stand for >=5 min in order to wash dishes and clean home.  Baseline: NT Goal status: INITIAL  6.  Patient will be able to walk for >=10 min without being limited by low back pain to the point where she needs to take a seated or standing rest break as evidence of improved low back function and to return to walking community level distances.  Baseline:  NT  Goal status: INITIAL  PLAN:  PT FREQUENCY: 1-2x/week  PT DURATION: 12 weeks  PLANNED INTERVENTIONS: 97164- PT Re-evaluation, 97750- Physical Performance Testing, 97110-Therapeutic exercises, 97530- Therapeutic activity, [id number]- Neuromuscular re-education, 97535- Self Care, [id number]- Manual therapy, [id number]- Gait training, [phone]- Orthotic Initial, [phone]- Orthotic/Prosthetic subsequent, [phone]- Canalith repositioning, [id number]- Aquatic Therapy, U2725- Electrical stimulation (unattended), [phone]- Electrical stimulation (manual), [id number]- Ultrasound, [id number]- Traction (mechanical), [id number]- Wound care (first 20 sq cm), 97598- Wound care (each additional 20 sq cm),  20560 (1-2 muscles), 20561 (3+ muscles)- Dry Needling, Patient/Family education, Balance training, Stair training, Joint mobilization, Joint manipulation, Spinal manipulation, Spinal mobilization, Vestibular training, DME instructions, Cryotherapy, and Moist heat.  PLAN FOR NEXT SESSION: , 5 x STS, Sahrman test, progress abdominal and  hip strengthening in none pain provoking positions.  Marge Shed PT, DPT  [facility] [address], [place], [address] Phone: [phone]   Fax:  [phone]

## 2024-02-18 ENCOUNTER — Ambulatory Visit: Admitting: Physical Therapy

## 2024-02-18 ENCOUNTER — Other Ambulatory Visit: Payer: Self-pay | Admitting: Family Medicine

## 2024-02-18 ENCOUNTER — Telehealth: Payer: Self-pay

## 2024-02-18 DIAGNOSIS — G8929 Other chronic pain: Secondary | ICD-10-CM | POA: Diagnosis not present

## 2024-02-18 DIAGNOSIS — M5459 Other low back pain: Secondary | ICD-10-CM

## 2024-02-18 DIAGNOSIS — M545 Low back pain, unspecified: Secondary | ICD-10-CM | POA: Diagnosis not present

## 2024-02-18 NOTE — Telephone Encounter (Signed)
 Copied from CRM [id number]. Topic: General - Other >> Feb 17, 2024  1:42 PM Emylou G wrote: Reason for CRM: Patient called.. liver is inflamed.. can't take tylenol  or nsaids.  Needs alternative.. she is in pain

## 2024-02-18 NOTE — Therapy (Signed)
 OUTPATIENT PHYSICAL THERAPY THORACOLUMBAR TREATMENT    Patient Name: Valerie Vazquez MRN: [id number] DOB:Jan 22, 1956, 68 y.o., female Today's Date: 02/18/2024  END OF SESSION:  PT End of Session - 02/18/24 1125     Visit Number 2    Number of Visits 24    Date for PT Re-Evaluation 05/10/24    Authorization Type [employer] Medicare 2025    Authorization - Visit Number 2    Authorization - Number of Visits 24    Progress Note Due on Visit 10    PT Start Time 1120    PT Stop Time 1200    PT Time Calculation (min) 40 min    Activity Tolerance Patient limited by pain    Behavior During Therapy [facility] for tasks assessed/performed           Past Medical History:  Diagnosis Date   Acute right-sided low back pain with right-sided sciatica 10/23/2017   Anemia    Anginal pain (HCC)    Bilateral ovarian cysts    Bilateral renal cysts    Bulimia nervosa 05/07/2021   CAD (coronary artery disease) 09/28/2016   Chest pain 10/14/2015   CHF (congestive heart failure) (HCC)    Diastolic   Chronic back pain    upper and lower (09/19/2014)   Chronic shoulder pain (Location of Primary Source of Pain) (Left) 01/29/2016   Colitis 04/08/2016   Colon polyp 01/30/2017   Coronary vasospasm (HCC) 12/09/2014   DDD (degenerative disc disease), cervical    a.) s/p ACDF C5-C7   Depression    DVT (deep venous thrombosis) (HCC)    Esophageal spasm    Essential hypertension 10/28/2013   Gallstones    Gastritis    Gastroenteritis    GERD (gastroesophageal reflux disease)    Heart disease 12/09/2016   High blood pressure    High cholesterol    History of blood transfusion 1985   related to hysterectomy   History of left heart catheterization (LHC) 12/09/2014   a.) LHC 12/09/2014: EF 50%; normal coronaries.   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 Rule out Paget's disease of the bone with blood work.    Hyperlipidemia 10/28/2013   Hypertension    Hypothyroidism    IBS (irritable bowel syndrome)    Long term current use of anticoagulant    a.) apixaban    Lumbar central spinal stenosis (moderate at L4-5; mild at L2-3 and L3-4) 01/29/2016   Migraine    a few times/yr (09/19/2014)   Myocardial infarction (HCC) 10/2010 X 3   while hospitalized   Neck pain 01/29/2016   Osteoarthritis    Peripheral vascular disease (HCC)    DVT/PE. On Eliquis    Pleural effusion, bilateral 07/31/2015   Pneumonia 04/2014   PONV (postoperative nausea and vomiting)    PTSD (post-traumatic stress disorder)    Hx. None currently (2024)   Pulmonary embolism (HCC) 04/2001; 09/19/2014   after gallbladder OR;    Schizoaffective disorder, depressive type (HCC) 08/28/2016   Sleep apnea    mild (09/19/2014)   Snoring    a. sleep study 5/16: No OSA   SOB (shortness of breath) 07/31/2015   Spinal stenosis    Spondylosis    Suicidal ideation 08/27/2016   a.) seen in ED with approx  6 months of reported SI. Reported ingestion of unknown pills to end life; last ingestion was 3 days PTA; UDS (+) for TCAs. SI related to depression, friend (loss of friend), and social determinants   Type II diabetes mellitus (HCC)    dx'd in 2007; lost weight; no RX for ~ 4 yr now (09/19/2014)   Past Surgical History:  Procedure Laterality Date   ANTERIOR CERVICAL DECOMP/DISCECTOMY FUSION  10/2012   in [place]  C5-C7    APPENDECTOMY  1985   BACK SURGERY     BREAST CYST EXCISION Right    BREAST EXCISIONAL BIOPSY Right YRS AGO   NEG   CARDIAC CATHETERIZATION   2000's; 2009   CHOLECYSTECTOMY  2002   COLONOSCOPY WITH PROPOFOL  N/A 12/12/2016   Procedure: COLONOSCOPY WITH PROPOFOL ;  Surgeon: Luke Salaam, MD;  Location: [facility] ENDOSCOPY;  Service: Endoscopy;  Laterality: N/A;   COLONOSCOPY WITH PROPOFOL  N/A 02/17/2017    Procedure: COLONOSCOPY WITH PROPOFOL ;  Surgeon: Luke Salaam, MD;  Location: [facility] ENDOSCOPY;  Service: Gastroenterology;  Laterality: N/A;   COLONOSCOPY WITH PROPOFOL  N/A 11/04/2021   Procedure: COLONOSCOPY WITH PROPOFOL ;  Surgeon: Luke Salaam, MD;  Location: [facility] ENDOSCOPY;  Service: Gastroenterology;  Laterality: N/A;   CYSTOSCOPY N/A 05/15/2022   Procedure: CYSTOSCOPY;  Surgeon: Kris Pester, MD;  Location: [facility] ORS;  Service: Gynecology;  Laterality: N/A;   ESOPHAGOGASTRODUODENOSCOPY (EGD) WITH PROPOFOL  N/A 02/17/2017   Procedure: ESOPHAGOGASTRODUODENOSCOPY (EGD) WITH PROPOFOL ;  Surgeon: Luke Salaam, MD;  Location: [facility] ENDOSCOPY;  Service: Gastroenterology;  Laterality: N/A;   ESOPHAGOGASTRODUODENOSCOPY (EGD) WITH PROPOFOL  N/A 03/07/2019   Procedure: ESOPHAGOGASTRODUODENOSCOPY (EGD) WITH PROPOFOL ;  Surgeon: Luke Salaam, MD;  Location: [facility] ENDOSCOPY;  Service: Gastroenterology;  Laterality: N/A;   EXCISION/RELEASE BURSA HIP Right 12/1984   HERNIA REPAIR  1985   IVC FILTER REMOVAL     2020s   LEFT HEART CATHETERIZATION WITH CORONARY ANGIOGRAM N/A 12/09/2014   Procedure: LEFT HEART CATHETERIZATION WITH CORONARY ANGIOGRAM;  Surgeon: Odie Benne, MD;  Location: [facility] CATH LAB;  Service: Cardiovascular;  Laterality: N/A;   PARTIAL HYSTERECTOMY  1985   ROBOTIC ASSISTED BILATERAL SALPINGO OOPHERECTOMY Bilateral 05/15/2022   Procedure: XI ROBOTIC ASSISTED BILATERAL OOPHORECTOMY;  Surgeon: Kris Pester, MD;  Location: [facility] ORS;  Service: Gynecology;  Laterality: Bilateral;   SPINE SURGERY  07/2023   TONSILLECTOMY AND ADENOIDECTOMY  ~ 1968   TOTAL HIP ARTHROPLASTY Left 04/06/2014   UMBILICAL HERNIA REPAIR  1985   VENA CAVA FILTER PLACEMENT  03/2014   Patient Active Problem List   Diagnosis Date Noted   Vaginal discharge 01/23/2024   Urethral prolapse 01/23/2024   Nocturia 01/23/2024   Incontinence of feces 01/22/2024   History of recurrent UTI (urinary tract infection)  01/22/2024   Decreased glomerular filtration rate (GFR) 01/21/2024   Influenza A 10/12/2023   Spondylolisthesis at L4-L5 level 07/27/2023   Left leg numbness 05/20/2023   Thoracic back pain 03/13/2023   Obesity (BMI 30.0-34.9) 12/10/2022   Subcutaneous nodule 06/09/2022   Numbness of right foot 03/05/2022   OAB (overactive bladder) 03/05/2022   Ingrown fingernail 12/27/2021   Cramps, extremity 11/27/2021   Skin lesion of lower extremity- right knee  09/26/2021   Retention cyst of nasal sinus 08/27/2021   Rash 08/27/2021   Bulimia 01/14/2021   Cervical radiculitis 11/15/2020   Right arm pain 11/12/2020   Bilateral low back pain without sciatica 06/20/2020   Paronychia of great toe of right foot 04/05/2020   Paronychia of great toe, right 02/27/2020  Foraminal stenosis of cervical region 01/31/2020   Cervical fusion syndrome (C5-C7 2014) 01/31/2020   Ingrown toenail 01/27/2020   Chronic diastolic heart failure (HCC) 01/27/2020   Postmenopausal estrogen deficiency 08/12/2019   Left wrist pain 05/20/2019   Allergic rhinitis 01/27/2019   Pain of right lower extremity 10/06/2018   Mixed stress and urge urinary incontinence 10/27/2017   Elevated alkaline phosphatase level 10/27/2017   Hypothyroidism 08/26/2017   Type 2 diabetes mellitus with other specified complication (HCC) 05/29/2017   Right upper quadrant pain 01/12/2017   Plantar fasciitis of left foot 11/07/2016   CAD (coronary artery disease) 09/28/2016   Epigastric discomfort 09/16/2016   Schizoaffective disorder, depressive type (HCC) 08/28/2016   Cervicalgia 01/29/2016   Chronic low back pain (Location of Secondary source of pain) (Bilateral) (R>L) 01/29/2016   Chronic pain syndrome 01/29/2016   Primary osteoarthritis of right hip 01/29/2016   Lumbar central spinal stenosis (moderate at L4-5; mild at L2-3 and L3-4) 01/29/2016   Breast pain 10/19/2015   Abdominal pain 10/14/2015   Cough 07/31/2015   Esophageal spasm  06/05/2015   History of DVT (deep vein thrombosis) 02/25/2015   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 Judyann Number    REFERRING PROVIDER: Dr. Gearl Keens   REFERRING DIAG: M54.40 (ICD-10-CM) - Lumbago of lumbar region with sciatica   Rationale for Evaluation and Treatment: Rehabilitation  THERAPY DIAG:  Other low back pain  ONSET DATE: 12/01/23  SUBJECTIVE:                                                                                                                                                                                           SUBJECTIVE STATEMENT: Pt reports there low back pain remains about the same. She describes needing a rollator when walking longer distances due to her low back feeling pain and fatigue if she walks for a long time.   PERTINENT HISTORY:  Pt reports that had lumbar spinal fusion in November of 2024 and she has since developed soreness and pain in her low back that is similar to what she was feeling before the surgery. She has also had surgery on her cervical spine in September of last year and she has had a left hip replacement. She has since weaned off  Tylenol  because of liver issues, so her pain is not as well controlled and she is seeking further medical treatment and has an appointment scheduled with her physician. She is also concerned about her liver function and she is following up with physician for  CT scan.   PAIN:  Are you having pain? Yes: NPRS scale: 6/10  Pain location: Central spinous process of low back and wraps around anteriorly towards ASIS  Pain  description: Dull pain and fatigue when standing and walking for long periods of time   Aggravating factors: Walking around and laying down  Relieving factors: Sitting Down, but not for a long time.   PRECAUTIONS: None  RED FLAGS: None   WEIGHT BEARING RESTRICTIONS: No  FALLS:  Has patient fallen in last 6 months? No, she had a fall in September of last year when standing up from bed because she could not feel her left foot and she lost her balance.   LIVING ENVIRONMENT: Lives with: lives alone Lives in: House/apartment Stairs: Yes: External: 3 steps; on right going up Has following equipment at home: Single point cane, Walker - 2 wheeled, Wheelchair (manual), shower chair, Tour manager, and Grab bars  OCCUPATION: Not currently working.   PLOF: Independent  PATIENT GOALS: She is limited in doing housework activities because of her low back pain. She is unable to clean up her house as a result.   NEXT MD VISIT: Doctor will schedule apt after her CT scan of low back and liver   OBJECTIVE:  Note: Objective measures were completed at Evaluation unless otherwise noted.  VITALS: BP 116/74HR 72 Sp02 98  DIAGNOSTIC FINDINGS:  CLINICAL DATA:  Chronic low back pain, right leg pain for few years   EXAM: MRI LUMBAR SPINE WITHOUT CONTRAST   TECHNIQUE: Multiplanar, multisequence MR imaging of the lumbar spine was performed. No intravenous contrast was administered.   COMPARISON:  12/25/2021, 06/21/2020   FINDINGS: Segmentation:  Standard.   Alignment:  Minimal grade 1 anterolisthesis of L4 on L5.   Vertebrae: No acute fracture, evidence of discitis, or aggressive bone lesion.   Conus medullaris and cauda equina: Conus extends to the L1-2 level. Conus and cauda equina appear normal.   Paraspinal and other soft tissues: No acute paraspinal abnormality.   Disc levels:   Disc spaces: Degenerative disease with disc height loss at L4-5.   T12-L1: No significant disc bulge.  No neural foraminal stenosis. No central canal stenosis.   L1-L2: Mild broad-based disc bulge. Moderate bilateral facet arthropathy. No foraminal or central canal stenosis.   L2-L3: Minimal broad-based disc bulge. Severe bilateral facet arthropathy. Moderate spinal stenosis. Right lateral recess stenosis. Mild right foraminal stenosis. No left foraminal stenosis.   L3-L4: Mild broad-based disc bulge. Severe bilateral facet arthropathy. Severe spinal stenosis. Mild right foraminal stenosis. No left foraminal stenosis.   L4-L5: Broad-based disc bulge. Severe bilateral facet arthropathy with ligamentum flavum infolding. Moderate-severe spinal stenosis. Moderate right foraminal stenosis. No left foraminal stenosis. Bilateral lateral recess stenosis.   L5-S1: Mild broad-based disc bulge. Severe bilateral facet arthropathy. No foraminal or central canal stenosis.   IMPRESSION: 1. Diffuse lumbar spine spondylosis as described above. 2. No acute osseous injury of the lumbar spine.     Electronically Signed   By: Onnie Bilis M.D.   On: 06/10/2023 09:52  PATIENT SURVEYS:  Modified Oswestry NT    COGNITION: Overall cognitive status: Within functional limits for tasks assessed     SENSATION: Light touch: Impaired  Neuropathy  in bilateral feet    MUSCLE LENGTH: Hamstrings: Right 70 deg; Left 70 deg   POSTURE: No Significant postural limitations  PALPATION: L2-L5 CSP   LUMBAR ROM:   AROM eval  Flexion 50%*  Extension 50%*  Right lateral flexion 50%*  Left lateral flexion 50%*  Right rotation 100%  Left rotation 100%   (Blank rows = not tested)  LOWER EXTREMITY ROM:     Active  Right eval Left eval  Hip flexion    Hip extension    Hip abduction    Hip adduction    Hip internal rotation    Hip external rotation    Knee flexion    Knee extension    Ankle dorsiflexion    Ankle plantarflexion    Ankle inversion    Ankle eversion     (Blank rows = not  tested)  LOWER EXTREMITY MMT:    MMT Right eval Left eval  Hip flexion 4- 4-   Hip extension 3- 3-  Hip abduction    Hip adduction    Hip internal rotation NT  NT   Hip external rotation NT NT   Knee flexion 4 4  Knee extension 4 4  Ankle dorsiflexion 4 4  Ankle plantarflexion    Ankle inversion    Ankle eversion     (Blank rows = not tested)  Abdominal Strength Testing: Sahrmann Testing    LUMBAR SPECIAL TESTS:  Straight leg raise test: Negative, SI Compression/distraction test: NT , FABER test: Positive, and FADIR Positive Bilateral   FUNCTIONAL TESTS:  5 times sit to stand: NT  30 seconds chair stand test 10 meter walk test: NT     MODI: 20/50=40%  TREATMENT DATE:   02/18/24: Nu-Step with seat at   10 Meter Walk Test   Gait Speed:    1st trial 12.39 sec , 2nd trial 12.10 sec  , Avg=  12.25  sec - Normal Gait speed Avg= 0.82  m/sec    <1 m/sec  indicates increased risk for falls  0.12 m/sec improvement represents a statistically significant improvement in gait speed    - 0.8 - 1.3 m/sec - community ambulator - associated with increased independence in self-care  30 sec chair stands: 8 reps   5 x STS: 18 sec   Sit to Stand 1 x 10   -Pt reports increased low back pain  Quarter Squat 1 x 10   -mod VC to bring weight posteriorly onto heels   Supine Bridge 1 x 5  -Pt reports increased low back pain   Seated Hip ER stretch 2 x 60 sec   Seated HS Stretch 2 x 60 sec  Quadruped Hip Ext 2 x 10  02/16/24: Prone Quad Stretch 2 X 60 sec   Lower Trunk Rotation 2 x 10 with 3 sec hold                  -Pt reports increased low back pain  Seated Forward Lean Center, Left, Right 3 x 60 sec                                                           -Pt reports increased low back pain when going into extension from terminal lumbar flexion     PATIENT EDUCATION:  Education details: Form and technique for correct performance of exercise.  Person educated:  Patient Education method: Explanation, Demonstration, Verbal cues, and Handouts Education comprehension: verbalized understanding, returned demonstration, and verbal cues required  HOME EXERCISE PROGRAM: Access Code: [email or web address] URL: [email or web address] Date: 02/18/2024 Prepared by: Marge Shed  Exercises - Seated Lumbar Flexion Stretch  - 1 x daily - 7 x weekly - 3 reps - 60 sec hold - Prone Quad Stretch with Towel Roll and Strap  - 1 x daily - 7 x weekly - 3 reps - 60 sec hold - Supine Lower Trunk Rotation  - 1 x daily - 7 x weekly - 2 sets - 10 reps - 3 sec hold - Seated Hamstring Stretch  - 1 x daily - 7 x weekly - 3 reps - 60 sec hold - Quadruped Bent Leg Hip Extension  - 3-4 x weekly - 2 sets - 10 reps  ASSESSMENT:  CLINICAL IMPRESSION: Pt shows flexion directional pattern with low back pain elicited with lumbar extension. Modified exercises accordingly to include increased hip flexion like quadruped position, which patient was able to perform without worsening her low back pain. She does exhibit gait speed and LE strength and endurance that place her at an increased risk for falls. She will continue to benefit from skilled PT to address these aforementioned deficits to return to standing and walking for prolonged of time to return to washing dishes and cleaning her home and to decrease risk falls.     OBJECTIVE IMPAIRMENTS: Abnormal gait, decreased activity tolerance, decreased balance, decreased endurance, decreased mobility, difficulty walking, decreased ROM, decreased strength, hypomobility, increased muscle spasms, impaired flexibility, impaired sensation, obesity, and pain.   ACTIVITY LIMITATIONS: carrying, lifting, bending, sitting, standing, squatting, sleeping, stairs, bathing, dressing, hygiene/grooming, and locomotion level  PARTICIPATION LIMITATIONS: meal prep, cleaning, laundry, driving, shopping, and community activity  PERSONAL FACTORS: Age, Fitness,  Time since onset of injury/illness/exacerbation, and 3+ comorbidities: HTN, HLD, Depression, Obesity, and T2DM are also affecting patient's functional outcome.   REHAB POTENTIAL: Fair chronicity of condition and prior interventions including low back surgery  CLINICAL DECISION MAKING: Evolving/moderate complexity  EVALUATION COMPLEXITY: Moderate   GOALS: Goals reviewed with patient? No  SHORT TERM GOALS: Target date: 03/01/2024  Patient will demonstrate undestanding of home exercise plan by performing exercises correctly with evidence of good carry over with min to no verbal or tactile cues .   Baseline: NT  Goal status: ONGOING    2.  Patient will show decreased risk of falling as evidence by decrease in self-selected gait speed by >= 0.12 m/sec.  Baseline: 0.82 m/sec Goal status: ONGOING     LONG TERM GOALS: Target date: 05/11/2024  Patient will improve modified Oswestry Disability Index (MODI) score by >=13 points as evidence of the minimal statistically significant change for improvement with low back pain disability and improvement in low back function (Copay et al, 2008) Baseline: 40% Goal status: ONGOING    2.  Patient will improve hip strength by 1/3 grade (I.E 4- to 4+) and abdominal strength by >= 1 grade for improved lumbar stability and to assist low back in absorbing mechanical forces in weight bearing positions for improved symptom response. Baseline: Hip MMT Flex R/L 4-/4-, Hip Ext R/L 3-/3-  Goal status: ONGOING     3.  Patient will perform 5 x STS in <12.6 sec to demonstrate improve LE strength to decrease risk of falling (Bohannon, 2006). Baseline: 18 sec  Goal status: ONGOING  4.  Patient will perform in >=1.0 m./sec as evidence of improved mobility, decreased falls risk, and overall functional independence.  Baseline: 0.82 m/sec Goal status: ONGOING    5.  Patient will demonstrate improved low back pain by being able to stand for >=5 min in order to  wash dishes and clean home.  Baseline: NT Goal status: ONGOING    6.  Patient will be able to walk for >=10 min without being limited by low back pain to the point where she needs to take a seated or standing rest break as evidence of improved low back function and to return to walking community level distances.  Baseline:  NT  Goal status: ONGOING    PLAN:  PT FREQUENCY: 1-2x/week  PT DURATION: 12 weeks  PLANNED INTERVENTIONS: [id number]- PT Re-evaluation, 97750- Physical Performance Testing, 97110-Therapeutic exercises, 97530- Therapeutic activity, [id number]- Neuromuscular re-education, 97535- Self Care, 16109- Manual therapy, [id number]- Gait training, [phone]- Orthotic Initial, [phone]- Orthotic/Prosthetic subsequent, [phone]- Canalith repositioning, [id number]- Aquatic Therapy, [id number]- Electrical stimulation (unattended), [phone]- Electrical stimulation (manual), [id number]- Ultrasound, [id number]- Traction (mechanical), [id number]- Wound care (first 20 sq cm), 97598- Wound care (each additional 20 sq cm), 20560 (1-2 muscles), 20561 (3+ muscles)- Dry Needling, Patient/Family education, Balance training, Stair training, Joint mobilization, Joint manipulation, Spinal manipulation, Spinal mobilization, Vestibular training, DME instructions, Cryotherapy, and Moist heat.  PLAN FOR NEXT SESSION: Sahrmann test and introduce abdominal strengthening exercises. Bird dogs.  Marge Shed PT, DPT  [facility] [address], [place], [address] Phone: [phone]   Fax:  [phone]

## 2024-02-19 ENCOUNTER — Other Ambulatory Visit: Payer: Self-pay | Admitting: Physician Assistant

## 2024-02-19 ENCOUNTER — Ambulatory Visit
Admission: RE | Admit: 2024-02-19 | Discharge: 2024-02-19 | Disposition: A | Discharge status: Home / Self Care | Source: Ambulatory Visit | Attending: Neurosurgery | Admitting: Neurosurgery

## 2024-02-19 ENCOUNTER — Other Ambulatory Visit: Payer: Self-pay

## 2024-02-19 DIAGNOSIS — Z981 Arthrodesis status: Secondary | ICD-10-CM | POA: Diagnosis not present

## 2024-02-19 DIAGNOSIS — M5440 Lumbago with sciatica, unspecified side: Secondary | ICD-10-CM

## 2024-02-19 DIAGNOSIS — G8929 Other chronic pain: Secondary | ICD-10-CM

## 2024-02-19 DIAGNOSIS — M545 Low back pain, unspecified: Secondary | ICD-10-CM

## 2024-02-19 DIAGNOSIS — K2970 Gastritis, unspecified, without bleeding: Secondary | ICD-10-CM

## 2024-02-19 DIAGNOSIS — M47817 Spondylosis without myelopathy or radiculopathy, lumbosacral region: Secondary | ICD-10-CM | POA: Diagnosis not present

## 2024-02-19 NOTE — Telephone Encounter (Signed)
 Spoke with pt made aware an verbalized understanding. Stated she is out of tramadol .

## 2024-02-19 NOTE — Telephone Encounter (Signed)
 Rx request for Tramadol  send to provider

## 2024-02-22 ENCOUNTER — Ambulatory Visit: Admitting: Physical Therapy

## 2024-02-22 DIAGNOSIS — M545 Low back pain, unspecified: Secondary | ICD-10-CM | POA: Diagnosis not present

## 2024-02-22 DIAGNOSIS — M5459 Other low back pain: Secondary | ICD-10-CM | POA: Diagnosis not present

## 2024-02-22 DIAGNOSIS — G8929 Other chronic pain: Secondary | ICD-10-CM | POA: Diagnosis not present

## 2024-02-22 DIAGNOSIS — N3000 Acute cystitis without hematuria: Secondary | ICD-10-CM | POA: Diagnosis not present

## 2024-02-22 MED ORDER — TRAMADOL HCL 50 MG PO TABS: 50.0000 mg | ORAL_TABLET | Freq: Two times a day (BID) | ORAL | 0 refills | Status: DC | PRN

## 2024-02-22 NOTE — Therapy (Signed)
 OUTPATIENT PHYSICAL THERAPY THORACOLUMBAR TREATMENT    Patient Name: Valerie Vazquez MRN: [id number] DOB:1956-07-29, 68 y.o., female Today's Date: 02/22/2024  END OF SESSION:  PT End of Session - 02/22/24 1306     Visit Number 3    Number of Visits 24    Date for PT Re-Evaluation 05/10/24    Authorization Type [employer] Medicare 2025    Authorization - Visit Number 3    Authorization - Number of Visits 24    Progress Note Due on Visit 10    PT Start Time 1303    PT Stop Time 1345    PT Time Calculation (min) 42 min    Activity Tolerance Patient limited by pain    Behavior During Therapy [facility] for tasks assessed/performed           Past Medical History:  Diagnosis Date   Acute right-sided low back pain with right-sided sciatica 10/23/2017   Anemia    Anginal pain (HCC)    Bilateral ovarian cysts    Bilateral renal cysts    Bulimia nervosa 05/07/2021   CAD (coronary artery disease) 09/28/2016   Chest pain 10/14/2015   CHF (congestive heart failure) (HCC)    Diastolic   Chronic back pain    upper and lower (09/19/2014)   Chronic shoulder pain (Location of Primary Source of Pain) (Left) 01/29/2016   Colitis 04/08/2016   Colon polyp 01/30/2017   Coronary vasospasm (HCC) 12/09/2014   DDD (degenerative disc disease), cervical    a.) s/p ACDF C5-C7   Depression    DVT (deep venous thrombosis) (HCC)    Esophageal spasm    Essential hypertension 10/28/2013   Gallstones    Gastritis    Gastroenteritis    GERD (gastroesophageal reflux disease)    Heart disease 12/09/2016   High blood pressure    High cholesterol    History of blood transfusion 1985   related to hysterectomy   History of left heart catheterization (LHC) 12/09/2014   a.) LHC 12/09/2014: EF 50%; normal coronaries.   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 Rule out Paget's disease of the bone with blood work.    Hyperlipidemia 10/28/2013   Hypertension    Hypothyroidism    IBS (irritable bowel syndrome)    Long term current use of anticoagulant    a.) apixaban    Lumbar central spinal stenosis (moderate at L4-5; mild at L2-3 and L3-4) 01/29/2016   Migraine    a few times/yr (09/19/2014)   Myocardial infarction (HCC) 10/2010 X 3   while hospitalized   Neck pain 01/29/2016   Osteoarthritis    Peripheral vascular disease (HCC)    DVT/PE. On Eliquis    Pleural effusion, bilateral 07/31/2015   Pneumonia 04/2014   PONV (postoperative nausea and vomiting)    PTSD (post-traumatic stress disorder)    Hx. None currently (2024)   Pulmonary embolism (HCC) 04/2001; 09/19/2014   after gallbladder OR;    Schizoaffective disorder, depressive type (HCC) 08/28/2016   Sleep apnea    mild (09/19/2014)   Snoring    a. sleep study 5/16: No OSA   SOB (shortness of breath) 07/31/2015   Spinal stenosis    Spondylosis    Suicidal ideation 08/27/2016   a.) seen in ED with approx  6 months of reported SI. Reported ingestion of unknown pills to end life; last ingestion was 3 days PTA; UDS (+) for TCAs. SI related to depression, friend (loss of friend), and social determinants   Type II diabetes mellitus (HCC)    dx'd in 2007; lost weight; no RX for ~ 4 yr now (09/19/2014)   Past Surgical History:  Procedure Laterality Date   ANTERIOR CERVICAL DECOMP/DISCECTOMY FUSION  10/2012   in [place]  C5-C7    APPENDECTOMY  1985   BACK SURGERY     BREAST CYST EXCISION Right    BREAST EXCISIONAL BIOPSY Right YRS AGO   NEG   CARDIAC CATHETERIZATION   2000's; 2009   CHOLECYSTECTOMY  2002   COLONOSCOPY WITH PROPOFOL  N/A 12/12/2016   Procedure: COLONOSCOPY WITH PROPOFOL ;  Surgeon: [name], MD;  Location: [facility] ENDOSCOPY;  Service: Endoscopy;  Laterality: N/A;   COLONOSCOPY WITH PROPOFOL  N/A 02/17/2017    Procedure: COLONOSCOPY WITH PROPOFOL ;  Surgeon: Kung Ruel, MD;  Location: [facility] ENDOSCOPY;  Service: Gastroenterology;  Laterality: N/A;   COLONOSCOPY WITH PROPOFOL  N/A 11/04/2021   Procedure: COLONOSCOPY WITH PROPOFOL ;  Surgeon: Kung Ruel, MD;  Location: [facility] ENDOSCOPY;  Service: Gastroenterology;  Laterality: N/A;   CYSTOSCOPY N/A 05/15/2022   Procedure: CYSTOSCOPY;  Surgeon: Leonce Garnette BIRCH, MD;  Location: [facility] ORS;  Service: Gynecology;  Laterality: N/A;   ESOPHAGOGASTRODUODENOSCOPY (EGD) WITH PROPOFOL  N/A 02/17/2017   Procedure: ESOPHAGOGASTRODUODENOSCOPY (EGD) WITH PROPOFOL ;  Surgeon: Kung Ruel, MD;  Location: [facility] ENDOSCOPY;  Service: Gastroenterology;  Laterality: N/A;   ESOPHAGOGASTRODUODENOSCOPY (EGD) WITH PROPOFOL  N/A 03/07/2019   Procedure: ESOPHAGOGASTRODUODENOSCOPY (EGD) WITH PROPOFOL ;  Surgeon: Kung Ruel, MD;  Location: [facility] ENDOSCOPY;  Service: Gastroenterology;  Laterality: N/A;   EXCISION/RELEASE BURSA HIP Right 12/1984   HERNIA REPAIR  1985   IVC FILTER REMOVAL     2020s   LEFT HEART CATHETERIZATION WITH CORONARY ANGIOGRAM N/A 12/09/2014   Procedure: LEFT HEART CATHETERIZATION WITH CORONARY ANGIOGRAM;  Surgeon: Lonni BIRCH Cash, MD;  Location: [facility] CATH LAB;  Service: Cardiovascular;  Laterality: N/A;   PARTIAL HYSTERECTOMY  1985   ROBOTIC ASSISTED BILATERAL SALPINGO OOPHERECTOMY Bilateral 05/15/2022   Procedure: XI ROBOTIC ASSISTED BILATERAL OOPHORECTOMY;  Surgeon: Leonce Garnette BIRCH, MD;  Location: [facility] ORS;  Service: Gynecology;  Laterality: Bilateral;   SPINE SURGERY  07/2023   TONSILLECTOMY AND ADENOIDECTOMY  ~ 1968   TOTAL HIP ARTHROPLASTY Left 04/06/2014   UMBILICAL HERNIA REPAIR  1985   VENA CAVA FILTER PLACEMENT  03/2014   Patient Active Problem List   Diagnosis Date Noted   Vaginal discharge 01/23/2024   Urethral prolapse 01/23/2024   Nocturia 01/23/2024   Incontinence of feces 01/22/2024   History of recurrent UTI (urinary tract infection)  01/22/2024   Decreased glomerular filtration rate (GFR) 01/21/2024   Influenza A 10/12/2023   Spondylolisthesis at L4-L5 level 07/27/2023   Left leg numbness 05/20/2023   Thoracic back pain 03/13/2023   Obesity (BMI 30.0-34.9) 12/10/2022   Subcutaneous nodule 06/09/2022   Numbness of right foot 03/05/2022   OAB (overactive bladder) 03/05/2022   Ingrown fingernail 12/27/2021   Cramps, extremity 11/27/2021   Skin lesion of lower extremity- right knee  09/26/2021   Retention cyst of nasal sinus 08/27/2021   Rash 08/27/2021   Bulimia 01/14/2021   Cervical radiculitis 11/15/2020   Right arm pain 11/12/2020   Bilateral low back pain without sciatica 06/20/2020   Paronychia of great toe of right foot 04/05/2020   Paronychia of great toe, right 02/27/2020  Foraminal stenosis of cervical region 01/31/2020   Cervical fusion syndrome (C5-C7 2014) 01/31/2020   Ingrown toenail 01/27/2020   Chronic diastolic heart failure (HCC) 01/27/2020   Postmenopausal estrogen deficiency 08/12/2019   Left wrist pain 05/20/2019   Allergic rhinitis 01/27/2019   Pain of right lower extremity 10/06/2018   Mixed stress and urge urinary incontinence 10/27/2017   Elevated alkaline phosphatase level 10/27/2017   Hypothyroidism 08/26/2017   Type 2 diabetes mellitus with other specified complication (HCC) 05/29/2017   Right upper quadrant pain 01/12/2017   Plantar fasciitis of left foot 11/07/2016   CAD (coronary artery disease) 09/28/2016   Epigastric discomfort 09/16/2016   Schizoaffective disorder, depressive type (HCC) 08/28/2016   Cervicalgia 01/29/2016   Chronic low back pain (Location of Secondary source of pain) (Bilateral) (R>L) 01/29/2016   Chronic pain syndrome 01/29/2016   Primary osteoarthritis of right hip 01/29/2016   Lumbar central spinal stenosis (moderate at L4-5; mild at L2-3 and L3-4) 01/29/2016   Breast pain 10/19/2015   Abdominal pain 10/14/2015   Cough 07/31/2015   Esophageal spasm  06/05/2015   History of DVT (deep vein thrombosis) 02/25/2015   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 Lauraine Buoy    REFERRING PROVIDER: Dr. Arley Helling   REFERRING DIAG: M54.40 (ICD-10-CM) - Lumbago of lumbar region with sciatica   Rationale for Evaluation and Treatment: Rehabilitation  THERAPY DIAG:  Other low back pain  ONSET DATE: 12/01/23  SUBJECTIVE:                                                                                                                                                                                           SUBJECTIVE STATEMENT: Pt states that she is following up with her orthopedic surgeon on July 17th.   PERTINENT HISTORY:  Pt reports that had lumbar spinal fusion in November of 2024 and she has since developed soreness and pain in her low back that is similar to what she was feeling before the surgery. She has also had surgery on her cervical spine in September of last year and she has had a left hip replacement. She has since weaned off Tylenol  because of liver issues, so her pain is not as well controlled and she is seeking further medical treatment and  has an appointment scheduled with her physician. She is also concerned about her liver function and she is following up with physician for  CT scan.   PAIN:  Are you having pain? Yes: NPRS scale: 6/10  Pain location: Central spinous process of low back and wraps around anteriorly towards ASIS  Pain description: Dull pain and fatigue when standing and walking for long periods of time   Aggravating factors: Walking  around and laying down  Relieving factors: Sitting Down, but not for a long time.   PRECAUTIONS: None  RED FLAGS: None   WEIGHT BEARING RESTRICTIONS: No  FALLS:  Has patient fallen in last 6 months? No, she had a fall in September of last year when standing up from bed because she could not feel her left foot and she lost her balance.   LIVING ENVIRONMENT: Lives with: lives alone Lives in: House/apartment Stairs: Yes: External: 3 steps; on right going up Has following equipment at home: Single point cane, Walker - 2 wheeled, Wheelchair (manual), shower chair, Tour manager, and Grab bars  OCCUPATION: Not currently working.   PLOF: Independent  PATIENT GOALS: She is limited in doing housework activities because of her low back pain. She is unable to clean up her house as a result.   NEXT MD VISIT: Doctor will schedule apt after her CT scan of low back and liver   OBJECTIVE:  Note: Objective measures were completed at Evaluation unless otherwise noted.  VITALS: BP 116/74HR 72 Sp02 98  DIAGNOSTIC FINDINGS:  CLINICAL DATA:  Chronic low back pain, right leg pain for few years   EXAM: MRI LUMBAR SPINE WITHOUT CONTRAST   TECHNIQUE: Multiplanar, multisequence MR imaging of the lumbar spine was performed. No intravenous contrast was administered.   COMPARISON:  12/25/2021, 06/21/2020   FINDINGS: Segmentation:  Standard.   Alignment:  Minimal grade 1 anterolisthesis of L4 on L5.   Vertebrae: No acute fracture, evidence of discitis, or aggressive bone lesion.   Conus medullaris and cauda equina: Conus extends to the L1-2 level. Conus and cauda equina appear normal.   Paraspinal and other soft tissues: No acute paraspinal abnormality.   Disc levels:   Disc spaces: Degenerative disease with disc height loss at L4-5.   T12-L1: No significant disc bulge. No neural foraminal stenosis. No central canal stenosis.   L1-L2: Mild broad-based disc bulge. Moderate bilateral  facet arthropathy. No foraminal or central canal stenosis.   L2-L3: Minimal broad-based disc bulge. Severe bilateral facet arthropathy. Moderate spinal stenosis. Right lateral recess stenosis. Mild right foraminal stenosis. No left foraminal stenosis.   L3-L4: Mild broad-based disc bulge. Severe bilateral facet arthropathy. Severe spinal stenosis. Mild right foraminal stenosis. No left foraminal stenosis.   L4-L5: Broad-based disc bulge. Severe bilateral facet arthropathy with ligamentum flavum infolding. Moderate-severe spinal stenosis. Moderate right foraminal stenosis. No left foraminal stenosis. Bilateral lateral recess stenosis.   L5-S1: Mild broad-based disc bulge. Severe bilateral facet arthropathy. No foraminal or central canal stenosis.   IMPRESSION: 1. Diffuse lumbar spine spondylosis as described above. 2. No acute osseous injury of the lumbar spine.     Electronically Signed   By: Julaine Blanch M.D.   On: 06/10/2023 09:52  PATIENT SURVEYS:  Modified Oswestry NT    COGNITION: Overall cognitive status: Within functional limits for tasks assessed     SENSATION: Light touch: Impaired  Neuropathy in bilateral feet    MUSCLE LENGTH: Hamstrings: Right 70 deg; Left 70 deg   POSTURE: No Significant postural  limitations  PALPATION: L2-L5 CSP   LUMBAR ROM:   AROM eval  Flexion 50%*  Extension 50%*  Right lateral flexion 50%*  Left lateral flexion 50%*  Right rotation 100%  Left rotation 100%   (Blank rows = not tested)  LOWER EXTREMITY ROM:     Active  Right eval Left eval  Hip flexion    Hip extension    Hip abduction    Hip adduction    Hip internal rotation    Hip external rotation    Knee flexion    Knee extension    Ankle dorsiflexion    Ankle plantarflexion    Ankle inversion    Ankle eversion     (Blank rows = not tested)  LOWER EXTREMITY MMT:    MMT Right eval Left eval  Hip flexion 4- 4-   Hip extension 3- 3-  Hip abduction  4- 4  Hip adduction    Hip internal rotation NT  NT   Hip external rotation NT NT   Knee flexion 4 4  Knee extension 4 4  Ankle dorsiflexion 4 4  Ankle plantarflexion    Ankle inversion    Ankle eversion     (Blank rows = not tested)  Abdominal Strength Testing: Sahrmann Testing  Level 4    LUMBAR SPECIAL TESTS:  Straight leg raise test: Negative, SI Compression/distraction test: NT , FABER test: Positive, and FADIR Positive Bilateral   FUNCTIONAL TESTS:  5 times sit to stand: NT  30 seconds chair stand test 10 meter walk test: NT     MODI: 20/50=40%  TREATMENT DATE:   02/22/24: THEREX   Nu-Step Seat and Arms at 9 for 5 min  Sahrmann level 3   Sahrmann level 4  Sahrmann level 5   -Pt reports increased low back pain NRPS 5/10  90/90 Hip and Knee Flexion 2  x 10  -Pt reports increased low back pain  Supine Abdominal Crunches 2 x 10  -min VC to clear shoulder blades off table  Hip Abd R/L 4-/4 Clam Shell RLE 1 x 10  Clam Shell RLE with yellow band 1 x 10  Clam Shell LLE with yellow band 1 x 10  Clam Shell RLE with red band 2 x 10   -min VC to decrease trunk rotation    02/18/24: Nu-Step with seat at   10 Meter Walk Test   Gait Speed:    1st trial 12.39 sec , 2nd trial 12.10 sec  , Avg=  12.25  sec - Normal Gait speed Avg= 0.82  m/sec    <1 m/sec  indicates increased risk for falls  0.12 m/sec improvement represents a statistically significant improvement in gait speed    - 0.8 - 1.3 m/sec - community ambulator - associated with increased independence in self-care  30 sec chair stands: 8 reps   5 x STS: 18 sec   Sit to Stand 1 x 10   -Pt reports increased low back pain  Quarter Squat 1 x 10   -mod VC to bring weight posteriorly onto heels   Supine Bridge 1 x 5  -Pt reports increased low back pain   Seated Hip ER stretch 2 x 60 sec   Seated HS Stretch 2 x 60 sec  Quadruped Hip Ext 2 x 10  PATIENT EDUCATION:  Education details: Form and technique  for correct performance of exercise.  Person educated: Patient Education method: Explanation, Demonstration, Verbal cues, and Handouts Education comprehension: verbalized understanding,  returned demonstration, and verbal cues required  HOME EXERCISE PROGRAM: Access Code: [email or web address] URL: [email or web address] Date: 02/22/2024 Prepared by: Toribio Servant  Exercises - Bound Angle Hands Forward   - 1 x daily - 7 x weekly - 3 reps - 30-60 sec  hold - Prone Quad Stretch with Towel Roll and Strap  - 1 x daily - 7 x weekly - 3 reps - 60 sec hold - Supine Lower Trunk Rotation  - 1 x daily - 7 x weekly - 2 sets - 10 reps - 3 sec hold - Seated Hamstring Stretch  - 1 x daily - 7 x weekly - 3 reps - 60 sec hold - Clamshell with Resistance  - 3-4 x weekly - 3 sets - 10 reps - Quadruped Bent Leg Hip Extension  - 3-4 x weekly - 2 sets - 10 reps - Curl Up with Reach  - 3-4 x weekly - 3 sets - 8-10 reps   ASSESSMENT:  CLINICAL IMPRESSION: Pt exhibits positive attitude and willingness to participate throughout session. She did have minor increase in pain with Sahrmann testing, but this is likely due to lumbar extension with compensation from abdominal fatigue. Otherwise, she responded positively with little to no increase in her low back pain with flexion based exercises. She will continue to benefit from skilled PT to address these aforementioned deficits to return to standing and walking for prolonged of time to return to washing dishes and cleaning her home and to decrease risk falls.   OBJECTIVE IMPAIRMENTS: Abnormal gait, decreased activity tolerance, decreased balance, decreased endurance, decreased mobility, difficulty walking, decreased ROM, decreased strength, hypomobility, increased muscle spasms, impaired flexibility, impaired sensation, obesity, and pain.   ACTIVITY LIMITATIONS: carrying, lifting, bending, sitting, standing, squatting, sleeping, stairs, bathing, dressing,  hygiene/grooming, and locomotion level  PARTICIPATION LIMITATIONS: meal prep, cleaning, laundry, driving, shopping, and community activity  PERSONAL FACTORS: Age, Fitness, Time since onset of injury/illness/exacerbation, and 3+ comorbidities: HTN, HLD, Depression, Obesity, and T2DM are also affecting patient's functional outcome.   REHAB POTENTIAL: Fair chronicity of condition and prior interventions including low back surgery  CLINICAL DECISION MAKING: Evolving/moderate complexity  EVALUATION COMPLEXITY: Moderate   GOALS: Goals reviewed with patient? No  SHORT TERM GOALS: Target date: 03/01/2024  Patient will demonstrate undestanding of home exercise plan by performing exercises correctly with evidence of good carry over with min to no verbal or tactile cues .   Baseline: NT  Goal status: ONGOING    2.  Patient will show decreased risk of falling as evidence by decrease in self-selected gait speed by >= 0.12 m/sec.  Baseline: 0.82 m/sec Goal status: ONGOING     LONG TERM GOALS: Target date: 05/11/2024  Patient will improve modified Oswestry Disability Index (MODI) score by >=13 points as evidence of the minimal statistically significant change for improvement with low back pain disability and improvement in low back function (Copay et al, 2008) Baseline: 40% Goal status: ONGOING    2.  Patient will improve hip strength by 1/3 grade (I.E 4- to 4+) and abdominal strength by >= 1 grade for improved lumbar stability and to assist low back in absorbing mechanical forces in weight bearing positions for improved symptom response. Baseline: Hip MMT Flex R/L 4-/4-, Hip Ext R/L 3-/3-, Sahrmann level 4    Goal status: ONGOING     3.  Patient will perform 5 x STS in <12.6 sec to demonstrate improve LE strength to decrease risk of falling (Bohannon, 2006). Baseline: 18 sec  Goal status: ONGOING    4.  Patient will perform in >=1.0 m./sec as evidence of improved mobility, decreased  falls risk, and overall functional independence.  Baseline: 0.82 m/sec Goal status: ONGOING    5.  Patient will demonstrate improved low back pain by being able to stand for >=5 min in order to wash dishes and clean home.  Baseline: NT Goal status: ONGOING    6.  Patient will be able to walk for >=10 min without being limited by low back pain to the point where she needs to take a seated or standing rest break as evidence of improved low back function and to return to walking community level distances.  Baseline:  NT  Goal status: ONGOING    PLAN:  PT FREQUENCY: 1-2x/week  PT DURATION: 12 weeks  PLANNED INTERVENTIONS: [id number]- PT Re-evaluation, 97750- Physical Performance Testing, 97110-Therapeutic exercises, 97530- Therapeutic activity, [id number]- Neuromuscular re-education, 97535- Self Care, [id number]- Manual therapy, [id number]- Gait training, [phone]- Orthotic Initial, [phone]- Orthotic/Prosthetic subsequent, [phone]- Canalith repositioning, [id number]- Aquatic Therapy, H9716- Electrical stimulation (unattended), [phone]- Electrical stimulation (manual), [id number]- Ultrasound, [id number]- Traction (mechanical), [id number]- Wound care (first 20 sq cm), 97598- Wound care (each additional 20 sq cm), 20560 (1-2 muscles), 20561 (3+ muscles)- Dry Needling, Patient/Family education, Balance training, Stair training, Joint mobilization, Joint manipulation, Spinal manipulation, Spinal mobilization, Vestibular training, DME instructions, Cryotherapy, and Moist heat.  PLAN FOR NEXT SESSION: Supine Hip Ext Testing. Hip IR and ER MMT. Warm Up walking around clinic or on TM. Bird Dogs. Initiate standing hi[p strengthening exercises.  Toribio Servant PT, DPT  [facility] [address] [place], [address] Phone: [phone]   Fax:  [phone]

## 2024-02-23 ENCOUNTER — Telehealth: Payer: Self-pay

## 2024-02-23 ENCOUNTER — Ambulatory Visit: Admitting: Physical Therapy

## 2024-02-23 DIAGNOSIS — K5900 Constipation, unspecified: Secondary | ICD-10-CM | POA: Diagnosis not present

## 2024-02-23 DIAGNOSIS — R1310 Dysphagia, unspecified: Secondary | ICD-10-CM | POA: Diagnosis not present

## 2024-02-23 DIAGNOSIS — K219 Gastro-esophageal reflux disease without esophagitis: Secondary | ICD-10-CM | POA: Diagnosis not present

## 2024-02-23 DIAGNOSIS — R7989 Other specified abnormal findings of blood chemistry: Secondary | ICD-10-CM | POA: Diagnosis not present

## 2024-02-23 DIAGNOSIS — R140 Abdominal distension (gaseous): Secondary | ICD-10-CM | POA: Diagnosis not present

## 2024-02-23 DIAGNOSIS — Z599 Problem related to housing and economic circumstances, unspecified: Secondary | ICD-10-CM | POA: Diagnosis not present

## 2024-02-23 LAB — GENECONNECT MOLECULAR SCREEN: Genetic Analysis Overall Interpretation: NEGATIVE

## 2024-02-23 NOTE — Telephone Encounter (Signed)
   Pre-operative Risk Assessment    Patient Name: Valerie Vazquez  DOB: 1955-11-09 MRN: [id number]   Date of last office visit: 06/26/23 CAMPBELL CAVE, MD Date of next office visit: NONE   Request for Surgical Clearance    Procedure:  COLON  Date of Surgery:  Clearance 03/01/24                                Surgeon:  DR THERISA Socks Group or Practice Name:  [facility] Phone number:  [phone] Fax number:  [phone]  ATTN: CHIQUITA BAPTIST, CMA   Type of Clearance Requested:   - Medical  - Pharmacy:  Hold Apixaban  (Eliquis )     Type of Anesthesia:  Not Indicated   Additional requests/questions:    Signed, Lucie DELENA Ku   02/23/2024, 4:02 PM

## 2024-02-24 ENCOUNTER — Other Ambulatory Visit: Payer: Self-pay | Admitting: Gastroenterology

## 2024-02-24 DIAGNOSIS — R7989 Other specified abnormal findings of blood chemistry: Secondary | ICD-10-CM

## 2024-02-24 NOTE — Telephone Encounter (Signed)
   Patient Name: Valerie Vazquez  DOB: 12/16/1955 MRN: [id number]  Primary Cardiologist: Redell Cave, MD  Chart reviewed as part of pre-operative protocol coverage. Given past medical history and time since last visit, based on ACC/AHA guidelines, NAVEENA EYMAN is at acceptable risk for the planned procedure without further cardiovascular testing.   The case was reviewed by our clinical pharmacist, patient may hold Eliquis  for 2 days prior to the procedure and restart as soon as possible afterward at this GI doctor's discretion based on bleeding risk.  She may also hold Ozempic  for 7 days and Jardiance  for 3 days prior to the procedure and start after the procedure.  The patient was advised that if she develops new symptoms prior to surgery to contact our office to arrange for a follow-up visit, and she verbalized understanding.  I will route this recommendation to the requesting party via Epic fax function and remove from pre-op pool.  Please call with questions.  Arrow Emmerich, [place] 02/24/2024, 11:03 AM

## 2024-02-24 NOTE — Telephone Encounter (Signed)
 Patient with diagnosis of DVT on Eliquis  for anticoagulation.    Procedure: COLON  Date of procedure: 03/01/24  CrCl 72 ml/min Platelet count 288K   Per office protocol, patient can hold Eliquis  for 2 days prior to procedure.    **This guidance is not considered finalized until pre-operative APP has relayed final recommendations.**

## 2024-02-25 ENCOUNTER — Ambulatory Visit
Admission: RE | Admit: 2024-02-25 | Discharge: 2024-02-25 | Disposition: A | Discharge status: Home / Self Care | Source: Ambulatory Visit | Attending: Gastroenterology | Admitting: Gastroenterology

## 2024-02-25 ENCOUNTER — Ambulatory Visit: Admitting: Physical Therapy

## 2024-02-25 DIAGNOSIS — Z9049 Acquired absence of other specified parts of digestive tract: Secondary | ICD-10-CM | POA: Diagnosis not present

## 2024-02-25 DIAGNOSIS — M5459 Other low back pain: Secondary | ICD-10-CM

## 2024-02-25 DIAGNOSIS — M545 Low back pain, unspecified: Secondary | ICD-10-CM | POA: Diagnosis not present

## 2024-02-25 DIAGNOSIS — R945 Abnormal results of liver function studies: Secondary | ICD-10-CM | POA: Diagnosis not present

## 2024-02-25 DIAGNOSIS — G8929 Other chronic pain: Secondary | ICD-10-CM | POA: Diagnosis not present

## 2024-02-25 DIAGNOSIS — R7989 Other specified abnormal findings of blood chemistry: Secondary | ICD-10-CM

## 2024-02-25 NOTE — Therapy (Signed)
 OUTPATIENT PHYSICAL THERAPY THORACOLUMBAR TREATMENT    Patient Name: Valerie Vazquez MRN: [id number] DOB:Jan 12, 1956, 68 y.o., female Today's Date: 02/25/2024  END OF SESSION:  PT End of Session - 02/25/24 1403     Visit Number 4    Number of Visits 24    Date for PT Re-Evaluation 05/10/24    Authorization Type [employer] Medicare 2025    Authorization - Visit Number 4    Authorization - Number of Visits 24    Progress Note Due on Visit 10    PT Start Time 1350    PT Stop Time 1430    PT Time Calculation (min) 40 min    Activity Tolerance Patient limited by pain    Behavior During Therapy [facility] for tasks assessed/performed            Past Medical History:  Diagnosis Date   Acute right-sided low back pain with right-sided sciatica 10/23/2017   Anemia    Anginal pain (HCC)    Bilateral ovarian cysts    Bilateral renal cysts    Bulimia nervosa 05/07/2021   CAD (coronary artery disease) 09/28/2016   Chest pain 10/14/2015   CHF (congestive heart failure) (HCC)    Diastolic   Chronic back pain    upper and lower (09/19/2014)   Chronic shoulder pain (Location of Primary Source of Pain) (Left) 01/29/2016   Colitis 04/08/2016   Colon polyp 01/30/2017   Coronary vasospasm (HCC) 12/09/2014   DDD (degenerative disc disease), cervical    a.) s/p ACDF C5-C7   Depression    DVT (deep venous thrombosis) (HCC)    Esophageal spasm    Essential hypertension 10/28/2013   Gallstones    Gastritis    Gastroenteritis    GERD (gastroesophageal reflux disease)    Heart disease 12/09/2016   High blood pressure    High cholesterol    History of blood transfusion 1985   related to hysterectomy   History of left heart catheterization (LHC) 12/09/2014   a.) LHC 12/09/2014: EF 50%; normal coronaries.   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 Rule out Paget's disease of the bone with blood work.    Hyperlipidemia 10/28/2013   Hypertension    Hypothyroidism    IBS (irritable bowel syndrome)    Long term current use of anticoagulant    a.) apixaban    Lumbar central spinal stenosis (moderate at L4-5; mild at L2-3 and L3-4) 01/29/2016   Migraine    a few times/yr (09/19/2014)   Myocardial infarction (HCC) 10/2010 X 3   while hospitalized   Neck pain 01/29/2016   Osteoarthritis    Peripheral vascular disease (HCC)    DVT/PE. On Eliquis    Pleural effusion, bilateral 07/31/2015   Pneumonia 04/2014   PONV (postoperative nausea and vomiting)    PTSD (post-traumatic stress disorder)    Hx. None currently (2024)   Pulmonary embolism (HCC) 04/2001; 09/19/2014   after gallbladder OR;    Schizoaffective disorder, depressive type (HCC) 08/28/2016   Sleep apnea    mild (09/19/2014)   Snoring    a. sleep study 5/16: No OSA   SOB (shortness of breath) 07/31/2015   Spinal stenosis    Spondylosis    Suicidal ideation 08/27/2016   a.) seen in ED with  approx 6 months of reported SI. Reported ingestion of unknown pills to end life; last ingestion was 3 days PTA; UDS (+) for TCAs. SI related to depression, friend (loss of friend), and social determinants   Type II diabetes mellitus (HCC)    dx'd in 2007; lost weight; no RX for ~ 4 yr now (09/19/2014)   Past Surgical History:  Procedure Laterality Date   ANTERIOR CERVICAL DECOMP/DISCECTOMY FUSION  10/2012   in [place]  C5-C7    APPENDECTOMY  1985   BACK SURGERY     BREAST CYST EXCISION Right    BREAST EXCISIONAL BIOPSY Right YRS AGO   NEG   CARDIAC CATHETERIZATION   2000's; 2009   CHOLECYSTECTOMY  2002   COLONOSCOPY WITH PROPOFOL  N/A 12/12/2016   Procedure: COLONOSCOPY WITH PROPOFOL ;  Surgeon: [name], MD;  Location: [facility] ENDOSCOPY;  Service: Endoscopy;  Laterality: N/A;   COLONOSCOPY WITH PROPOFOL  N/A 02/17/2017    Procedure: COLONOSCOPY WITH PROPOFOL ;  Surgeon: Kung Ruel, MD;  Location: [facility] ENDOSCOPY;  Service: Gastroenterology;  Laterality: N/A;   COLONOSCOPY WITH PROPOFOL  N/A 11/04/2021   Procedure: COLONOSCOPY WITH PROPOFOL ;  Surgeon: Kung Ruel, MD;  Location: [facility] ENDOSCOPY;  Service: Gastroenterology;  Laterality: N/A;   CYSTOSCOPY N/A 05/15/2022   Procedure: CYSTOSCOPY;  Surgeon: Leonce Garnette BIRCH, MD;  Location: [facility] ORS;  Service: Gynecology;  Laterality: N/A;   ESOPHAGOGASTRODUODENOSCOPY (EGD) WITH PROPOFOL  N/A 02/17/2017   Procedure: ESOPHAGOGASTRODUODENOSCOPY (EGD) WITH PROPOFOL ;  Surgeon: Kung Ruel, MD;  Location: [facility] ENDOSCOPY;  Service: Gastroenterology;  Laterality: N/A;   ESOPHAGOGASTRODUODENOSCOPY (EGD) WITH PROPOFOL  N/A 03/07/2019   Procedure: ESOPHAGOGASTRODUODENOSCOPY (EGD) WITH PROPOFOL ;  Surgeon: Kung Ruel, MD;  Location: [facility] ENDOSCOPY;  Service: Gastroenterology;  Laterality: N/A;   EXCISION/RELEASE BURSA HIP Right 12/1984   HERNIA REPAIR  1985   IVC FILTER REMOVAL     2020s   LEFT HEART CATHETERIZATION WITH CORONARY ANGIOGRAM N/A 12/09/2014   Procedure: LEFT HEART CATHETERIZATION WITH CORONARY ANGIOGRAM;  Surgeon: Lonni BIRCH Cash, MD;  Location: [facility] CATH LAB;  Service: Cardiovascular;  Laterality: N/A;   PARTIAL HYSTERECTOMY  1985   ROBOTIC ASSISTED BILATERAL SALPINGO OOPHERECTOMY Bilateral 05/15/2022   Procedure: XI ROBOTIC ASSISTED BILATERAL OOPHORECTOMY;  Surgeon: Leonce Garnette BIRCH, MD;  Location: [facility] ORS;  Service: Gynecology;  Laterality: Bilateral;   SPINE SURGERY  07/2023   TONSILLECTOMY AND ADENOIDECTOMY  ~ 1968   TOTAL HIP ARTHROPLASTY Left 04/06/2014   UMBILICAL HERNIA REPAIR  1985   VENA CAVA FILTER PLACEMENT  03/2014   Patient Active Problem List   Diagnosis Date Noted   Vaginal discharge 01/23/2024   Urethral prolapse 01/23/2024   Nocturia 01/23/2024   Incontinence of feces 01/22/2024   History of recurrent UTI (urinary tract infection)  01/22/2024   Decreased glomerular filtration rate (GFR) 01/21/2024   Influenza A 10/12/2023   Spondylolisthesis at L4-L5 level 07/27/2023   Left leg numbness 05/20/2023   Thoracic back pain 03/13/2023   Obesity (BMI 30.0-34.9) 12/10/2022   Subcutaneous nodule 06/09/2022   Numbness of right foot 03/05/2022   OAB (overactive bladder) 03/05/2022   Ingrown fingernail 12/27/2021   Cramps, extremity 11/27/2021   Skin lesion of lower extremity- right knee  09/26/2021   Retention cyst of nasal sinus 08/27/2021   Rash 08/27/2021   Bulimia 01/14/2021   Cervical radiculitis 11/15/2020   Right arm pain 11/12/2020   Bilateral low back pain without sciatica 06/20/2020   Paronychia of great toe of right foot 04/05/2020   Paronychia of great toe, right 02/27/2020  Foraminal stenosis of cervical region 01/31/2020   Cervical fusion syndrome (C5-C7 2014) 01/31/2020   Ingrown toenail 01/27/2020   Chronic diastolic heart failure (HCC) 01/27/2020   Postmenopausal estrogen deficiency 08/12/2019   Left wrist pain 05/20/2019   Allergic rhinitis 01/27/2019   Pain of right lower extremity 10/06/2018   Mixed stress and urge urinary incontinence 10/27/2017   Elevated alkaline phosphatase level 10/27/2017   Hypothyroidism 08/26/2017   Type 2 diabetes mellitus with other specified complication (HCC) 05/29/2017   Right upper quadrant pain 01/12/2017   Plantar fasciitis of left foot 11/07/2016   CAD (coronary artery disease) 09/28/2016   Epigastric discomfort 09/16/2016   Schizoaffective disorder, depressive type (HCC) 08/28/2016   Cervicalgia 01/29/2016   Chronic low back pain (Location of Secondary source of pain) (Bilateral) (R>L) 01/29/2016   Chronic pain syndrome 01/29/2016   Primary osteoarthritis of right hip 01/29/2016   Lumbar central spinal stenosis (moderate at L4-5; mild at L2-3 and L3-4) 01/29/2016   Breast pain 10/19/2015   Abdominal pain 10/14/2015   Cough 07/31/2015   Esophageal spasm  06/05/2015   History of DVT (deep vein thrombosis) 02/25/2015   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 Lauraine Buoy    REFERRING PROVIDER: Dr. Arley Helling   REFERRING DIAG: M54.40 (ICD-10-CM) - Lumbago of lumbar region with sciatica   Rationale for Evaluation and Treatment: Rehabilitation  THERAPY DIAG:  Other low back pain  ONSET DATE: 12/01/23  SUBJECTIVE:                                                                                                                                                                                           SUBJECTIVE STATEMENT: Pt reports that she feels increased low back soreness at the start of the session.   PERTINENT HISTORY:  Pt reports that had lumbar spinal fusion in November of 2024 and she has since developed soreness and pain in her low back that is similar to what she was feeling before the surgery. She has also had surgery on her cervical spine in September of last year and she has had a left hip replacement. She has since weaned off Tylenol  because of liver issues, so her pain is not as well controlled and she is seeking further medical treatment  and has an appointment scheduled with her physician. She is also concerned about her liver function and she is following up with physician for  CT scan.   PAIN:  Are you having pain? Yes: NPRS scale: 6/10  Pain location: Central spinous process of low back and wraps around anteriorly towards ASIS  Pain description: Dull pain and fatigue when standing and walking for long periods of time   Aggravating factors:  Walking around and laying down  Relieving factors: Sitting Down, but not for a long time.   PRECAUTIONS: None  RED FLAGS: None   WEIGHT BEARING RESTRICTIONS: No  FALLS:  Has patient fallen in last 6 months? No, she had a fall in September of last year when standing up from bed because she could not feel her left foot and she lost her balance.   LIVING ENVIRONMENT: Lives with: lives alone Lives in: House/apartment Stairs: Yes: External: 3 steps; on right going up Has following equipment at home: Single point cane, Walker - 2 wheeled, Wheelchair (manual), shower chair, Tour manager, and Grab bars  OCCUPATION: Not currently working.   PLOF: Independent  PATIENT GOALS: She is limited in doing housework activities because of her low back pain. She is unable to clean up her house as a result.   NEXT MD VISIT: Doctor will schedule apt after her CT scan of low back and liver   OBJECTIVE:  Note: Objective measures were completed at Evaluation unless otherwise noted.  VITALS: BP 116/74HR 72 Sp02 98  DIAGNOSTIC FINDINGS:  CLINICAL DATA:  Chronic low back pain, right leg pain for few years   EXAM: MRI LUMBAR SPINE WITHOUT CONTRAST   TECHNIQUE: Multiplanar, multisequence MR imaging of the lumbar spine was performed. No intravenous contrast was administered.   COMPARISON:  12/25/2021, 06/21/2020   FINDINGS: Segmentation:  Standard.   Alignment:  Minimal grade 1 anterolisthesis of L4 on L5.   Vertebrae: No acute fracture, evidence of discitis, or aggressive bone lesion.   Conus medullaris and cauda equina: Conus extends to the L1-2 level. Conus and cauda equina appear normal.   Paraspinal and other soft tissues: No acute paraspinal abnormality.   Disc levels:   Disc spaces: Degenerative disease with disc height loss at L4-5.   T12-L1: No significant disc bulge. No neural foraminal stenosis. No central canal stenosis.   L1-L2: Mild broad-based disc bulge. Moderate  bilateral facet arthropathy. No foraminal or central canal stenosis.   L2-L3: Minimal broad-based disc bulge. Severe bilateral facet arthropathy. Moderate spinal stenosis. Right lateral recess stenosis. Mild right foraminal stenosis. No left foraminal stenosis.   L3-L4: Mild broad-based disc bulge. Severe bilateral facet arthropathy. Severe spinal stenosis. Mild right foraminal stenosis. No left foraminal stenosis.   L4-L5: Broad-based disc bulge. Severe bilateral facet arthropathy with ligamentum flavum infolding. Moderate-severe spinal stenosis. Moderate right foraminal stenosis. No left foraminal stenosis. Bilateral lateral recess stenosis.   L5-S1: Mild broad-based disc bulge. Severe bilateral facet arthropathy. No foraminal or central canal stenosis.   IMPRESSION: 1. Diffuse lumbar spine spondylosis as described above. 2. No acute osseous injury of the lumbar spine.     Electronically Signed   By: Julaine Blanch M.D.   On: 06/10/2023 09:52  PATIENT SURVEYS:  Modified Oswestry NT    COGNITION: Overall cognitive status: Within functional limits for tasks assessed     SENSATION: Light touch: Impaired  Neuropathy in bilateral feet    MUSCLE LENGTH: Hamstrings: Right 70 deg; Left 70 deg   POSTURE: No Significant  postural limitations  PALPATION: L2-L5 CSP   LUMBAR ROM:   AROM eval  Flexion 50%*  Extension 50%*  Right lateral flexion 50%*  Left lateral flexion 50%*  Right rotation 100%  Left rotation 100%   (Blank rows = not tested)  LOWER EXTREMITY ROM:     Active  Right eval Left eval  Hip flexion    Hip extension    Hip abduction    Hip adduction    Hip internal rotation    Hip external rotation    Knee flexion    Knee extension    Ankle dorsiflexion    Ankle plantarflexion    Ankle inversion    Ankle eversion     (Blank rows = not tested)  LOWER EXTREMITY MMT:    MMT Right eval Left eval  Hip flexion 4- 4-   Hip extension 3- 3-  Hip  abduction 4- 4  Hip adduction    Hip internal rotation NT  NT   Hip external rotation NT NT   Knee flexion 4 4  Knee extension 4 4  Ankle dorsiflexion 4 4  Ankle plantarflexion    Ankle inversion    Ankle eversion     (Blank rows = not tested)  Abdominal Strength Testing: Sahrmann Testing  Level 4    LUMBAR SPECIAL TESTS:  Straight leg raise test: Negative, SI Compression/distraction test: NT , FABER test: Positive, and FADIR Positive Bilateral   FUNCTIONAL TESTS:  5 times sit to stand: NT  30 seconds chair stand test 10 meter walk test: NT     MODI: 20/50=40%  TREATMENT DATE:   02/25/24:  THEREX   Seated Lumbar flex AAROM center, right, and left  ball rolls with silver ball 3 x 30   Quadruped Extension 2 x 10  Forward Lean with Hip Extension 2 x 10  Seated Marches on [employer] 2 x 10  -Pt did not want to continue due to feeling unsteady  Hip ER R/L 4/4  Hip IR R/L 4/4   Nu-Step with seat and arms at 7 for 5 min and resistance  Seated TA activation with 5 sec hold 2 x 10    02/22/24: THEREX   Nu-Step Seat and Arms at 9 for 5 min  Sahrmann level 3   Sahrmann level 4  Sahrmann level 5   -Pt reports increased low back pain NRPS 5/10  90/90 Hip and Knee Flexion 2  x 10  -Pt reports increased low back pain  Supine Abdominal Crunches 2 x 10  -min VC to clear shoulder blades off table  Hip Abd R/L 4-/4 Clam Shell RLE 1 x 10  Clam Shell RLE with yellow band 1 x 10  Clam Shell LLE with yellow band 1 x 10  Clam Shell RLE with red band 2 x 10   -min VC to decrease trunk rotation     PATIENT EDUCATION:  Education details: Form and technique for correct performance of exercise.  Person educated: Patient Education method: Explanation, Demonstration, Verbal cues, and Handouts Education comprehension: verbalized understanding, returned demonstration, and verbal cues required  HOME EXERCISE PROGRAM: Access Code: [email or web address] URL:  [email or web address] Date: 02/25/2024 Prepared by: Toribio Servant  Exercises - Bound Angle Hands Forward   - 1 x daily - 7 x weekly - 3 reps - 30-60 sec  hold - Prone Quad Stretch with Towel Roll and Strap  - 1 x daily - 7 x weekly - 3 reps - 60 sec hold -  Supine Lower Trunk Rotation  - 1 x daily - 7 x weekly - 2 sets - 10 reps - 3 sec hold - Seated Hamstring Stretch  - 1 x daily - 7 x weekly - 3 reps - 60 sec hold - Standing Supported Hip Extension at [employer]  - 3-4 x weekly - 3 sets - 10 reps - Clamshell with Resistance  - 3-4 x weekly - 3 sets - 10 reps - Curl Up with Reach  - 3-4 x weekly - 3 sets - 8-10 reps   ASSESSMENT:  CLINICAL IMPRESSION: Pt shows improvement in activity tolerance with ability to perform hip strengthening exercises without increasing her low back pain. Modified abdominal strengthening exercises to include neutral pelvis position to avoid exacerbation of low back pain. She will continue to benefit from skilled PT to address these aforementioned deficits to return to standing and walking for prolonged of time to return to washing dishes and cleaning her home and to decrease risk falls.   OBJECTIVE IMPAIRMENTS: Abnormal gait, decreased activity tolerance, decreased balance, decreased endurance, decreased mobility, difficulty walking, decreased ROM, decreased strength, hypomobility, increased muscle spasms, impaired flexibility, impaired sensation, obesity, and pain.   ACTIVITY LIMITATIONS: carrying, lifting, bending, sitting, standing, squatting, sleeping, stairs, bathing, dressing, hygiene/grooming, and locomotion level  PARTICIPATION LIMITATIONS: meal prep, cleaning, laundry, driving, shopping, and community activity  PERSONAL FACTORS: Age, Fitness, Time since onset of injury/illness/exacerbation, and 3+ comorbidities: HTN, HLD, Depression, Obesity, and T2DM are also affecting patient's functional outcome.   REHAB POTENTIAL: Fair chronicity of  condition and prior interventions including low back surgery  CLINICAL DECISION MAKING: Evolving/moderate complexity  EVALUATION COMPLEXITY: Moderate   GOALS: Goals reviewed with patient? No  SHORT TERM GOALS: Target date: 03/01/2024  Patient will demonstrate undestanding of home exercise plan by performing exercises correctly with evidence of good carry over with min to no verbal or tactile cues .   Baseline: NT  Goal status: ONGOING    2.  Patient will show decreased risk of falling as evidence by decrease in self-selected gait speed by >= 0.12 m/sec.  Baseline: 0.82 m/sec Goal status: ONGOING     LONG TERM GOALS: Target date: 05/11/2024  Patient will improve modified Oswestry Disability Index (MODI) score by >=13 points as evidence of the minimal statistically significant change for improvement with low back pain disability and improvement in low back function (Copay et al, 2008) Baseline: 40% Goal status: ONGOING    2.  Patient will improve hip strength by 1/3 grade (I.E 4- to 4+) and abdominal strength by >= 1 grade for improved lumbar stability and to assist low back in absorbing mechanical forces in weight bearing positions for improved symptom response. Baseline: Hip MMT Flex R/L 4-/4-, Hip Ext R/L 3-/3-, Sahrmann level 4    Goal status: ONGOING     3.  Patient will perform 5 x STS in <12.6 sec to demonstrate improve LE strength to decrease risk of falling (Bohannon, 2006). Baseline: 18 sec  Goal status: ONGOING    4.  Patient will perform in >=1.0 m./sec as evidence of improved mobility, decreased falls risk, and overall functional independence.  Baseline: 0.82 m/sec Goal status: ONGOING    5.  Patient will demonstrate improved low back pain by being able to stand for >=5 min in order to wash dishes and clean home.  Baseline: NT Goal status: ONGOING    6.  Patient will be able to walk for >=10 min without being limited by low  back pain to the point where she  needs to take a seated or standing rest break as evidence of improved low back function and to return to walking community level distances.  Baseline:  NT  Goal status: ONGOING    PLAN:  PT FREQUENCY: 1-2x/week  PT DURATION: 12 weeks  PLANNED INTERVENTIONS: [id number]- PT Re-evaluation, 97750- Physical Performance Testing, 97110-Therapeutic exercises, 97530- Therapeutic activity, [id number]- Neuromuscular re-education, 97535- Self Care, [id number]- Manual therapy, [id number]- Gait training, [phone]- Orthotic Initial, [phone]- Orthotic/Prosthetic subsequent, [phone]- Canalith repositioning, [id number]- Aquatic Therapy, H9716- Electrical stimulation (unattended), [phone]- Electrical stimulation (manual), [id number]- Ultrasound, [id number]- Traction (mechanical), [id number]- Wound care (first 20 sq cm), 97598- Wound care (each additional 20 sq cm), 20560 (1-2 muscles), 20561 (3+ muscles)- Dry Needling, Patient/Family education, Balance training, Stair training, Joint mobilization, Joint manipulation, Spinal manipulation, Spinal mobilization, Vestibular training, DME instructions, Cryotherapy, and Moist heat.  PLAN FOR NEXT SESSION: Reassess goals especially . Modified Bird Dogs. Standing heel raises and marches.    Toribio Servant PT, DPT  [facility] [address], [place], [address] Phone: [phone]   Fax:  [phone]

## 2024-02-26 NOTE — Progress Notes (Addendum)
 [facility] - Patient Enrolled  This serves as confirmation that your patient, Valerie Vazquez, has enrolled in [place].  [name] was identified by provider referral as a candidate for [facility] Coordination service(s).  An appointment has been scheduled for [name] on 03/07/24 at the patient's request with a [facility], Kita Blamer.  For more information on [facility] services, click here.

## 2024-02-29 ENCOUNTER — Encounter: Payer: Self-pay | Admitting: Physical Therapy

## 2024-02-29 ENCOUNTER — Ambulatory Visit: Admitting: Physical Therapy

## 2024-02-29 DIAGNOSIS — G8929 Other chronic pain: Secondary | ICD-10-CM | POA: Diagnosis not present

## 2024-02-29 DIAGNOSIS — M5459 Other low back pain: Secondary | ICD-10-CM

## 2024-02-29 DIAGNOSIS — M545 Low back pain, unspecified: Secondary | ICD-10-CM | POA: Diagnosis not present

## 2024-02-29 DIAGNOSIS — E039 Hypothyroidism, unspecified: Secondary | ICD-10-CM | POA: Diagnosis not present

## 2024-02-29 DIAGNOSIS — E119 Type 2 diabetes mellitus without complications: Secondary | ICD-10-CM | POA: Diagnosis not present

## 2024-02-29 NOTE — Therapy (Signed)
 OUTPATIENT PHYSICAL THERAPY THORACOLUMBAR TREATMENT    Patient Name: Valerie Vazquez MRN: [id number] DOB:12/30/1955, 68 y.o., female Today's Date: 02/29/2024  END OF SESSION:  PT End of Session - 02/29/24 1742     Visit Number 5    Number of Visits 24    Date for PT Re-Evaluation 05/10/24    Authorization Type [employer] Medicare 2025    Authorization - Visit Number 5    Authorization - Number of Visits 24    Progress Note Due on Visit 10    PT Start Time 1735    PT Stop Time 1815    PT Time Calculation (min) 40 min    Activity Tolerance Patient limited by pain    Behavior During Therapy [facility] for tasks assessed/performed            Past Medical History:  Diagnosis Date   Acute right-sided low back pain with right-sided sciatica 10/23/2017   Anemia    Anginal pain (HCC)    Bilateral ovarian cysts    Bilateral renal cysts    Bulimia nervosa 05/07/2021   CAD (coronary artery disease) 09/28/2016   Chest pain 10/14/2015   CHF (congestive heart failure) (HCC)    Diastolic   Chronic back pain    upper and lower (09/19/2014)   Chronic shoulder pain (Location of Primary Source of Pain) (Left) 01/29/2016   Colitis 04/08/2016   Colon polyp 01/30/2017   Coronary vasospasm (HCC) 12/09/2014   DDD (degenerative disc disease), cervical    a.) s/p ACDF C5-C7   Depression    DVT (deep venous thrombosis) (HCC)    Esophageal spasm    Essential hypertension 10/28/2013   Gallstones    Gastritis    Gastroenteritis    GERD (gastroesophageal reflux disease)    Heart disease 12/09/2016   High blood pressure    High cholesterol    History of blood transfusion 1985   related to hysterectomy   History of left heart catheterization (LHC) 12/09/2014   a.) LHC 12/09/2014: EF 50%; normal coronaries.   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 Rule out Paget's disease of the bone with blood work.    Hyperlipidemia 10/28/2013   Hypertension    Hypothyroidism    IBS (irritable bowel syndrome)    Long term current use of anticoagulant    a.) apixaban    Lumbar central spinal stenosis (moderate at L4-5; mild at L2-3 and L3-4) 01/29/2016   Migraine    a few times/yr (09/19/2014)   Myocardial infarction (HCC) 10/2010 X 3   while hospitalized   Neck pain 01/29/2016   Osteoarthritis    Peripheral vascular disease (HCC)    DVT/PE. On Eliquis    Pleural effusion, bilateral 07/31/2015   Pneumonia 04/2014   PONV (postoperative nausea and vomiting)    PTSD (post-traumatic stress disorder)    Hx. None currently (2024)   Pulmonary embolism (HCC) 04/2001; 09/19/2014   after gallbladder OR;    Schizoaffective disorder, depressive type (HCC) 08/28/2016   Sleep apnea    mild (09/19/2014)   Snoring    a. sleep study 5/16: No OSA   SOB (shortness of breath) 07/31/2015   Spinal stenosis    Spondylosis    Suicidal ideation 08/27/2016   a.) seen in ED with  approx 6 months of reported SI. Reported ingestion of unknown pills to end life; last ingestion was 3 days PTA; UDS (+) for TCAs. SI related to depression, friend (loss of friend), and social determinants   Type II diabetes mellitus (HCC)    dx'd in 2007; lost weight; no RX for ~ 4 yr now (09/19/2014)   Past Surgical History:  Procedure Laterality Date   ANTERIOR CERVICAL DECOMP/DISCECTOMY FUSION  10/2012   in [place]  C5-C7    APPENDECTOMY  1985   BACK SURGERY     BREAST CYST EXCISION Right    BREAST EXCISIONAL BIOPSY Right YRS AGO   NEG   CARDIAC CATHETERIZATION   2000's; 2009   CHOLECYSTECTOMY  2002   COLONOSCOPY WITH PROPOFOL  N/A 12/12/2016   Procedure: COLONOSCOPY WITH PROPOFOL ;  Surgeon: [name], MD;  Location: [facility] ENDOSCOPY;  Service: Endoscopy;  Laterality: N/A;   COLONOSCOPY WITH PROPOFOL  N/A 02/17/2017    Procedure: COLONOSCOPY WITH PROPOFOL ;  Surgeon: Kung Ruel, MD;  Location: [facility] ENDOSCOPY;  Service: Gastroenterology;  Laterality: N/A;   COLONOSCOPY WITH PROPOFOL  N/A 11/04/2021   Procedure: COLONOSCOPY WITH PROPOFOL ;  Surgeon: Kung Ruel, MD;  Location: [facility] ENDOSCOPY;  Service: Gastroenterology;  Laterality: N/A;   CYSTOSCOPY N/A 05/15/2022   Procedure: CYSTOSCOPY;  Surgeon: Leonce Garnette BIRCH, MD;  Location: [facility] ORS;  Service: Gynecology;  Laterality: N/A;   ESOPHAGOGASTRODUODENOSCOPY (EGD) WITH PROPOFOL  N/A 02/17/2017   Procedure: ESOPHAGOGASTRODUODENOSCOPY (EGD) WITH PROPOFOL ;  Surgeon: Kung Ruel, MD;  Location: [facility] ENDOSCOPY;  Service: Gastroenterology;  Laterality: N/A;   ESOPHAGOGASTRODUODENOSCOPY (EGD) WITH PROPOFOL  N/A 03/07/2019   Procedure: ESOPHAGOGASTRODUODENOSCOPY (EGD) WITH PROPOFOL ;  Surgeon: Kung Ruel, MD;  Location: [facility] ENDOSCOPY;  Service: Gastroenterology;  Laterality: N/A;   EXCISION/RELEASE BURSA HIP Right 12/1984   HERNIA REPAIR  1985   IVC FILTER REMOVAL     2020s   LEFT HEART CATHETERIZATION WITH CORONARY ANGIOGRAM N/A 12/09/2014   Procedure: LEFT HEART CATHETERIZATION WITH CORONARY ANGIOGRAM;  Surgeon: Lonni BIRCH Cash, MD;  Location: [facility] CATH LAB;  Service: Cardiovascular;  Laterality: N/A;   PARTIAL HYSTERECTOMY  1985   ROBOTIC ASSISTED BILATERAL SALPINGO OOPHERECTOMY Bilateral 05/15/2022   Procedure: XI ROBOTIC ASSISTED BILATERAL OOPHORECTOMY;  Surgeon: Leonce Garnette BIRCH, MD;  Location: [facility] ORS;  Service: Gynecology;  Laterality: Bilateral;   SPINE SURGERY  07/2023   TONSILLECTOMY AND ADENOIDECTOMY  ~ 1968   TOTAL HIP ARTHROPLASTY Left 04/06/2014   UMBILICAL HERNIA REPAIR  1985   VENA CAVA FILTER PLACEMENT  03/2014   Patient Active Problem List   Diagnosis Date Noted   Vaginal discharge 01/23/2024   Urethral prolapse 01/23/2024   Nocturia 01/23/2024   Incontinence of feces 01/22/2024   History of recurrent UTI (urinary tract infection)  01/22/2024   Decreased glomerular filtration rate (GFR) 01/21/2024   Influenza A 10/12/2023   Spondylolisthesis at L4-L5 level 07/27/2023   Left leg numbness 05/20/2023   Thoracic back pain 03/13/2023   Obesity (BMI 30.0-34.9) 12/10/2022   Subcutaneous nodule 06/09/2022   Numbness of right foot 03/05/2022   OAB (overactive bladder) 03/05/2022   Ingrown fingernail 12/27/2021   Cramps, extremity 11/27/2021   Skin lesion of lower extremity- right knee  09/26/2021   Retention cyst of nasal sinus 08/27/2021   Rash 08/27/2021   Bulimia 01/14/2021   Cervical radiculitis 11/15/2020   Right arm pain 11/12/2020   Bilateral low back pain without sciatica 06/20/2020   Paronychia of great toe of right foot 04/05/2020   Paronychia of great toe, right 02/27/2020  Foraminal stenosis of cervical region 01/31/2020   Cervical fusion syndrome (C5-C7 2014) 01/31/2020   Ingrown toenail 01/27/2020   Chronic diastolic heart failure (HCC) 01/27/2020   Postmenopausal estrogen deficiency 08/12/2019   Left wrist pain 05/20/2019   Allergic rhinitis 01/27/2019   Pain of right lower extremity 10/06/2018   Mixed stress and urge urinary incontinence 10/27/2017   Elevated alkaline phosphatase level 10/27/2017   Hypothyroidism 08/26/2017   Type 2 diabetes mellitus with other specified complication (HCC) 05/29/2017   Right upper quadrant pain 01/12/2017   Plantar fasciitis of left foot 11/07/2016   CAD (coronary artery disease) 09/28/2016   Epigastric discomfort 09/16/2016   Schizoaffective disorder, depressive type (HCC) 08/28/2016   Cervicalgia 01/29/2016   Chronic low back pain (Location of Secondary source of pain) (Bilateral) (R>L) 01/29/2016   Chronic pain syndrome 01/29/2016   Primary osteoarthritis of right hip 01/29/2016   Lumbar central spinal stenosis (moderate at L4-5; mild at L2-3 and L3-4) 01/29/2016   Breast pain 10/19/2015   Abdominal pain 10/14/2015   Cough 07/31/2015   Esophageal spasm  06/05/2015   History of DVT (deep vein thrombosis) 02/25/2015   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 Lauraine Buoy    REFERRING PROVIDER: Dr. Arley Helling   REFERRING DIAG: M54.40 (ICD-10-CM) - Lumbago of lumbar region with sciatica   Rationale for Evaluation and Treatment: Rehabilitation  THERAPY DIAG:  Other low back pain  ONSET DATE: 12/01/23  SUBJECTIVE:                                                                                                                                                                                           SUBJECTIVE STATEMENT: Pt continues to report increased pain and soreness that radiates horizontally across low back.    PERTINENT HISTORY:  Pt reports that had lumbar spinal fusion in November of 2024 and she has since developed soreness and pain in her low back that is similar to what she was feeling before the surgery. She has also had surgery on her cervical spine in September of last year and she has had a left hip replacement. She has since weaned off Tylenol  because of liver issues, so her pain is not as well controlled and she is seeking further medical treatment  and has an appointment scheduled with her physician. She is also concerned about her liver function and she is following up with physician for  CT scan.   PAIN:  Are you having pain? Yes: NPRS scale: 4/10  Pain location: Central spinous process of low back and wraps around anteriorly towards ASIS  Pain description: Dull pain and fatigue when standing and walking for long periods of time   Aggravating  factors: Walking around and laying down  Relieving factors: Sitting Down, but not for a long time.   PRECAUTIONS: None  RED FLAGS: None   WEIGHT BEARING RESTRICTIONS: No  FALLS:  Has patient fallen in last 6 months? No, she had a fall in September of last year when standing up from bed because she could not feel her left foot and she lost her balance.   LIVING ENVIRONMENT: Lives with: lives alone Lives in: House/apartment Stairs: Yes: External: 3 steps; on right going up Has following equipment at home: Single point cane, Walker - 2 wheeled, Wheelchair (manual), shower chair, Tour manager, and Grab bars  OCCUPATION: Not currently working.   PLOF: Independent  PATIENT GOALS: She is limited in doing housework activities because of her low back pain. She is unable to clean up her house as a result.   NEXT MD VISIT: Doctor will schedule apt after her CT scan of low back and liver   OBJECTIVE:  Note: Objective measures were completed at Evaluation unless otherwise noted.  VITALS: BP 116/74HR 72 Sp02 98  DIAGNOSTIC FINDINGS:  CLINICAL DATA:  Chronic low back pain, right leg pain for few years   EXAM: MRI LUMBAR SPINE WITHOUT CONTRAST   TECHNIQUE: Multiplanar, multisequence MR imaging of the lumbar spine was performed. No intravenous contrast was administered.   COMPARISON:  12/25/2021, 06/21/2020   FINDINGS: Segmentation:  Standard.   Alignment:  Minimal grade 1 anterolisthesis of L4 on L5.   Vertebrae: No acute fracture, evidence of discitis, or aggressive bone lesion.   Conus medullaris and cauda equina: Conus extends to the L1-2 level. Conus and cauda equina appear normal.   Paraspinal and other soft tissues: No acute paraspinal abnormality.   Disc levels:   Disc spaces: Degenerative disease with disc height loss at L4-5.   T12-L1: No significant disc bulge. No neural foraminal stenosis. No central canal stenosis.   L1-L2: Mild broad-based disc bulge.  Moderate bilateral facet arthropathy. No foraminal or central canal stenosis.   L2-L3: Minimal broad-based disc bulge. Severe bilateral facet arthropathy. Moderate spinal stenosis. Right lateral recess stenosis. Mild right foraminal stenosis. No left foraminal stenosis.   L3-L4: Mild broad-based disc bulge. Severe bilateral facet arthropathy. Severe spinal stenosis. Mild right foraminal stenosis. No left foraminal stenosis.   L4-L5: Broad-based disc bulge. Severe bilateral facet arthropathy with ligamentum flavum infolding. Moderate-severe spinal stenosis. Moderate right foraminal stenosis. No left foraminal stenosis. Bilateral lateral recess stenosis.   L5-S1: Mild broad-based disc bulge. Severe bilateral facet arthropathy. No foraminal or central canal stenosis.   IMPRESSION: 1. Diffuse lumbar spine spondylosis as described above. 2. No acute osseous injury of the lumbar spine.     Electronically Signed   By: Julaine Blanch M.D.   On: 06/10/2023 09:52  PATIENT SURVEYS:  Modified Oswestry NT    COGNITION: Overall cognitive status: Within functional limits for tasks assessed     SENSATION: Light touch: Impaired  Neuropathy in bilateral feet    MUSCLE LENGTH: Hamstrings: Right 70 deg; Left 70 deg   POSTURE: No Significant  postural limitations  PALPATION: L2-L5 CSP   LUMBAR ROM:   AROM eval  Flexion 50%*  Extension 50%*  Right lateral flexion 50%*  Left lateral flexion 50%*  Right rotation 100%  Left rotation 100%   (Blank rows = not tested)  LOWER EXTREMITY ROM:     Active  Right eval Left eval  Hip flexion    Hip extension    Hip abduction    Hip adduction    Hip internal rotation    Hip external rotation    Knee flexion    Knee extension    Ankle dorsiflexion    Ankle plantarflexion    Ankle inversion    Ankle eversion     (Blank rows = not tested)  LOWER EXTREMITY MMT:    MMT Right eval Left eval  Hip flexion 4- 4-   Hip extension 3-  3-  Hip abduction 4- 4  Hip adduction    Hip internal rotation NT  NT   Hip external rotation NT NT   Knee flexion 4 4  Knee extension 4 4  Ankle dorsiflexion 4 4  Ankle plantarflexion    Ankle inversion    Ankle eversion     (Blank rows = not tested)  Abdominal Strength Testing: Sahrmann Testing  Level 4    LUMBAR SPECIAL TESTS:  Straight leg raise test: Negative, SI Compression/distraction test: NT , FABER test: Positive, and FADIR Positive Bilateral   FUNCTIONAL TESTS:  5 times sit to stand: NT  30 seconds chair stand test 10 meter walk test: NT     MODI: 20/50=40%  TREATMENT DATE:   02/29/24: THEREX   Nu-Step with seat and arms at 8 for 6 min  : 10 m/ 10.97 sec= 0.91 m/sec    Double Knee to Chest 3 x  30 sec  -Pt does not feel stretch in low back  Single Knee to Chest  2 x 5   -Pt does not feel low back stretch   Butterfly with forward reach for low back stretch 3 x 60 sec   Modified Bird Dog with lean on counter rather than wall 2 x 10    PATIENT EDUCATION:  Education details: Form and technique for correct performance of exercise.  Person educated: Patient Education method: Explanation, Demonstration, Verbal cues, and Handouts Education comprehension: verbalized understanding, returned demonstration, and verbal cues required  HOME EXERCISE PROGRAM: Access Code: [email or web address] URL: [email or web address] Date: 02/29/2024 Prepared by: Toribio Servant  Exercises - Bound Angle Hands Forward   - 1 x daily - 7 x weekly - 3 reps - 30-60 sec  hold - Prone Quad Stretch with Towel Roll and Strap  - 1 x daily - 7 x weekly - 3 reps - 60 sec hold - Supine Lower Trunk Rotation  - 1 x daily - 7 x weekly - 2 sets - 10 reps - 3 sec hold - Seated Hamstring Stretch  - 1 x daily - 7 x weekly - 3 reps - 60 sec hold - Seated Transversus Abdominis Bracing  - 1 x daily - 7 x weekly - 2 sets - 10 reps - 5 sec  hold - Clamshell with Resistance  - 3-4 x weekly - 3 sets  - 10 reps - Modified Bird Dog At Wall  - 3-4 x weekly - 2 sets - 10 reps   ASSESSMENT:  CLINICAL IMPRESSION: Pt continues to show ongoing decrease in low back pain in response to activity along with improved function as  evidenced by increase in gait speed. Despite these improvements, she continues to report being limited by pain in performing her activities of daily living especially with cleaning requiring several seated rest breaks to avoid worsening the pain. She will continue to benefit from skilled PT to address these aforementioned deficits to return to standing and walking for prolonged of time to return to washing dishes and cleaning her home and to decrease risk falls.   OBJECTIVE IMPAIRMENTS: Abnormal gait, decreased activity tolerance, decreased balance, decreased endurance, decreased mobility, difficulty walking, decreased ROM, decreased strength, hypomobility, increased muscle spasms, impaired flexibility, impaired sensation, obesity, and pain.   ACTIVITY LIMITATIONS: carrying, lifting, bending, sitting, standing, squatting, sleeping, stairs, bathing, dressing, hygiene/grooming, and locomotion level  PARTICIPATION LIMITATIONS: meal prep, cleaning, laundry, driving, shopping, and community activity  PERSONAL FACTORS: Age, Fitness, Time since onset of injury/illness/exacerbation, and 3+ comorbidities: HTN, HLD, Depression, Obesity, and T2DM are also affecting patient's functional outcome.   REHAB POTENTIAL: Fair chronicity of condition and prior interventions including low back surgery  CLINICAL DECISION MAKING: Evolving/moderate complexity  EVALUATION COMPLEXITY: Moderate   GOALS: Goals reviewed with patient? No  SHORT TERM GOALS: Target date: 03/01/2024  Patient will demonstrate undestanding of home exercise plan by performing exercises correctly with evidence of good carry over with min to no verbal or tactile cues .   Baseline: NT 02/29/24: Performing independently   Goal  status: ACHIEVED    2.  Patient will show decreased risk of falling as evidence by decrease in self-selected gait speed by >= 0.12 m/sec.  Baseline: 0.82 m/sec  02/29/24: 0.91 m/sec   Goal status: PARTIALLY MET     LONG TERM GOALS: Target date: 05/11/2024  Patient will improve modified Oswestry Disability Index (MODI) score by >=13 points as evidence of the minimal statistically significant change for improvement with low back pain disability and improvement in low back function (Copay et al, 2008) Baseline: 40% Goal status: ONGOING    2.  Patient will improve hip strength by 1/3 grade (I.E 4- to 4+) and abdominal strength by >= 1 grade for improved lumbar stability and to assist low back in absorbing mechanical forces in weight bearing positions for improved symptom response. Baseline: Hip MMT Flex R/L 4-/4-, Hip Ext R/L 3-/3-, Sahrmann level 4    Goal status: ONGOING     3.  Patient will perform 5 x STS in <12.6 sec to demonstrate improve LE strength to decrease risk of falling (Bohannon, 2006). Baseline: 18 sec  Goal status: ONGOING    4.  Patient will perform in >=1.0 m./sec as evidence of improved mobility, decreased falls risk, and overall functional independence.  Baseline: 0.82 m/sec 02/29/24:  0.91 m/sec   Goal status: ONGOING    5.  Patient will demonstrate improved low back pain by being able to stand for >=5 min in order to wash dishes and clean home.  Baseline: NT Goal status: ONGOING    6.  Patient will be able to walk for >=10 min without being limited by low back pain to the point where she needs to take a seated or standing rest break as evidence of improved low back function and to return to walking community level distances.  Baseline:  NT  Goal status: ONGOING    PLAN:  PT FREQUENCY: 1-2x/week  PT DURATION: 12 weeks  PLANNED INTERVENTIONS: [id number]- PT Re-evaluation, 97750- Physical Performance Testing, 97110-Therapeutic exercises, 97530- Therapeutic  activity, [id number]- Neuromuscular re-education, 97535- Self Care, 02859- Manual therapy, [id number]- Gait  training, 02239- Orthotic Initial, [id number]- Orthotic/Prosthetic subsequent, [phone]- Canalith repositioning, 02886- Aquatic Therapy, [phone]- Electrical stimulation (unattended), [phone]- Electrical stimulation (manual), [id number]- Ultrasound, [id number]- Traction (mechanical), [id number]- Wound care (first 20 sq cm), 97598- Wound care (each additional 20 sq cm), 20560 (1-2 muscles), 20561 (3+ muscles)- Dry Needling, Patient/Family education, Balance training, Stair training, Joint mobilization, Joint manipulation, Spinal manipulation, Spinal mobilization, Vestibular training, DME instructions, Cryotherapy, and Moist heat.  PLAN FOR NEXT SESSION: TM warm up. Hip ER Stretch. Standing heel raises and marches and hip abduction .    Toribio Servant PT, DPT  [facility] [address], [place], [address] Phone: [phone]   Fax:  [phone]

## 2024-03-02 ENCOUNTER — Encounter: Payer: Self-pay | Admitting: Gastroenterology

## 2024-03-02 ENCOUNTER — Ambulatory Visit: Admitting: Anesthesiology

## 2024-03-02 ENCOUNTER — Encounter
Admission: RE | Disposition: A | Discharge status: Home / Self Care | Payer: Self-pay | Source: Home / Self Care | Attending: Gastroenterology

## 2024-03-02 ENCOUNTER — Ambulatory Visit: Admitting: Physical Therapy

## 2024-03-02 ENCOUNTER — Ambulatory Visit
Admission: RE | Admit: 2024-03-02 | Discharge: 2024-03-02 | Disposition: A | Discharge status: Home / Self Care | Attending: Gastroenterology | Admitting: Gastroenterology

## 2024-03-02 DIAGNOSIS — G4730 Sleep apnea, unspecified: Secondary | ICD-10-CM | POA: Insufficient documentation

## 2024-03-02 DIAGNOSIS — R1310 Dysphagia, unspecified: Secondary | ICD-10-CM | POA: Diagnosis not present

## 2024-03-02 DIAGNOSIS — I252 Old myocardial infarction: Secondary | ICD-10-CM | POA: Insufficient documentation

## 2024-03-02 DIAGNOSIS — I5030 Unspecified diastolic (congestive) heart failure: Secondary | ICD-10-CM | POA: Diagnosis not present

## 2024-03-02 DIAGNOSIS — Z5941 Food insecurity: Secondary | ICD-10-CM | POA: Diagnosis not present

## 2024-03-02 DIAGNOSIS — I110 Hypertensive heart disease with heart failure: Secondary | ICD-10-CM | POA: Diagnosis not present

## 2024-03-02 DIAGNOSIS — Z86711 Personal history of pulmonary embolism: Secondary | ICD-10-CM | POA: Diagnosis not present

## 2024-03-02 DIAGNOSIS — I5032 Chronic diastolic (congestive) heart failure: Secondary | ICD-10-CM | POA: Diagnosis not present

## 2024-03-02 DIAGNOSIS — Z87891 Personal history of nicotine dependence: Secondary | ICD-10-CM | POA: Diagnosis not present

## 2024-03-02 DIAGNOSIS — I25119 Atherosclerotic heart disease of native coronary artery with unspecified angina pectoris: Secondary | ICD-10-CM | POA: Diagnosis not present

## 2024-03-02 DIAGNOSIS — K2100 Gastro-esophageal reflux disease with esophagitis, without bleeding: Secondary | ICD-10-CM | POA: Diagnosis not present

## 2024-03-02 DIAGNOSIS — I2510 Atherosclerotic heart disease of native coronary artery without angina pectoris: Secondary | ICD-10-CM | POA: Insufficient documentation

## 2024-03-02 DIAGNOSIS — K219 Gastro-esophageal reflux disease without esophagitis: Secondary | ICD-10-CM | POA: Diagnosis not present

## 2024-03-02 DIAGNOSIS — E1151 Type 2 diabetes mellitus with diabetic peripheral angiopathy without gangrene: Secondary | ICD-10-CM | POA: Insufficient documentation

## 2024-03-02 HISTORY — PX: ESOPHAGOGASTRODUODENOSCOPY: SHX5428

## 2024-03-02 LAB — GLUCOSE, CAPILLARY: Glucose-Capillary: 119 mg/dL — ABNORMAL HIGH (ref 70–99)

## 2024-03-02 SURGERY — EGD (ESOPHAGOGASTRODUODENOSCOPY)
Anesthesia: General

## 2024-03-02 MED ORDER — DEXMEDETOMIDINE HCL IN NACL 80 MCG/20ML IV SOLN
INTRAVENOUS | Status: DC | PRN
  Administered 2024-03-02: 4 ug via INTRAVENOUS

## 2024-03-02 MED ORDER — PROPOFOL 10 MG/ML IV BOLUS
INTRAVENOUS | Status: DC | PRN
  Administered 2024-03-02: 60 mg via INTRAVENOUS

## 2024-03-02 MED ORDER — SODIUM CHLORIDE 0.9 % IV SOLN
INTRAVENOUS | Status: DC
Start: 2024-03-02 — End: 2024-03-02

## 2024-03-02 MED ORDER — LIDOCAINE HCL (CARDIAC) PF 100 MG/5ML IV SOSY
PREFILLED_SYRINGE | INTRAVENOUS | Status: DC | PRN
  Administered 2024-03-02: 80 mg via INTRAVENOUS

## 2024-03-02 NOTE — Op Note (Signed)
 [facility] Gastroenterology Patient Name: Valerie Vazquez Procedure Date: 03/02/2024 9:01 AM MRN: [id number] Account #: [id number] Date of Birth: 04-10-1956 Admit Type: Outpatient Age: 68 Room: [facility] ENDO ROOM 3 Gender: Female Note Status: Finalized Instrument Name: Upper Endoscope [id number] Procedure:             Upper GI endoscopy Indications:           Dysphagia Providers:             [name] MD, MD Referring MD:          [name] MD, MD (Referring MD), Lauraine GEANNIE Buoy                         (Referring MD) Medicines:             Monitored Anesthesia Care Complications:         No immediate complications. Procedure:             Pre-Anesthesia Assessment:                        - Prior to the procedure, a History and Physical was                         performed, and patient medications, allergies and                         sensitivities were reviewed. The patient's tolerance                         of previous anesthesia was reviewed.                        - The risks and benefits of the procedure and the                         sedation options and risks were discussed with the                         patient. All questions were answered and informed                         consent was obtained.                        - ASA Grade Assessment: II - A patient with mild                         systemic disease.                        After obtaining informed consent, the endoscope was                         passed under direct vision. Throughout the procedure,                         the patient's blood pressure, pulse, and oxygen                          saturations were monitored  continuously. The                         Endosonoscope was introduced through the mouth, and                         advanced to the third part of duodenum. The upper GI                         endoscopy was accomplished with ease. The patient                         tolerated the  procedure well. Findings:      The examined duodenum was normal.      The stomach was normal.      The cardia and gastric fundus were normal on retroflexion.      The examined esophagus was normal. Biopsies were taken with a cold       forceps for histology. Impression:            - Normal examined duodenum.                        - Normal stomach.                        - Normal esophagus. Biopsied. Recommendation:        - Discharge patient to home (with escort).                        - Resume previous diet.                        - Continue present medications.                        - Return to my office as previously scheduled. Procedure Code(s):     --- Professional ---                        [phone], Esophagogastroduodenoscopy, flexible,                         transoral; with biopsy, single or multiple Diagnosis Code(s):     --- Professional ---                        R13.10, Dysphagia, unspecified CPT copyright 2022 American Medical Association. All rights reserved. The codes documented in this report are preliminary and upon coder review may  be revised to meet current compliance requirements. [name], MD [name] MD, MD 03/02/2024 9:19:42 AM This report has been signed electronically. Number of Addenda: 0 Note Initiated On: 03/02/2024 9:01 AM Estimated Blood Loss:  Estimated blood loss: none.      [facility]

## 2024-03-02 NOTE — H&P (Signed)
 [name] , MD [address], [address], [place], [place], [address] Phone: [phone] Fax: [phone]  Primary Care Physician:  [name], DO   Pre-Procedure History & Physical: HPI:  Valerie Vazquez is a 68 y.o. female is here for an endoscopy    Past Medical History:  Diagnosis Date   Acute right-sided low back pain with right-sided sciatica 10/23/2017   Anemia    Anginal pain (HCC)    Bilateral ovarian cysts    Bilateral renal cysts    Bulimia nervosa 05/07/2021   CAD (coronary artery disease) 09/28/2016   Chest pain 10/14/2015   CHF (congestive heart failure) (HCC)    Diastolic   Chronic back pain    upper and lower (09/19/2014)   Chronic shoulder pain (Location of Primary Source of Pain) (Left) 01/29/2016   Colitis 04/08/2016   Colon polyp 01/30/2017   Coronary vasospasm (HCC) 12/09/2014   DDD (degenerative disc disease), cervical    a.) s/p ACDF C5-C7   Depression    DVT (deep venous thrombosis) (HCC)    Esophageal spasm    Essential hypertension 10/28/2013   Gallstones    Gastritis    Gastroenteritis    GERD (gastroesophageal reflux disease)    Heart disease 12/09/2016   High blood pressure    High cholesterol    History of blood transfusion 1985   related to hysterectomy   History of left heart catheterization (LHC) 12/09/2014   a.) LHC 12/09/2014: EF 50%; normal coronaries.   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 Rule out Paget's disease of the bone with blood work.    Hyperlipidemia 10/28/2013   Hypertension    Hypothyroidism    IBS (irritable bowel syndrome)    Long term current use of anticoagulant    a.) apixaban    Lumbar central spinal stenosis (moderate at L4-5; mild at L2-3 and L3-4) 01/29/2016   Migraine    a few times/yr  (09/19/2014)   Myocardial infarction (HCC) 10/2010 X 3   while hospitalized   Neck pain 01/29/2016   Osteoarthritis    Peripheral vascular disease (HCC)    DVT/PE. On Eliquis    Pleural effusion, bilateral 07/31/2015   Pneumonia 04/2014   PONV (postoperative nausea and vomiting)    PTSD (post-traumatic stress disorder)    Hx. None currently (2024)   Pulmonary embolism (HCC) 04/2001; 09/19/2014   after gallbladder OR;    Schizoaffective disorder, depressive type (HCC) 08/28/2016   Sleep apnea    mild (09/19/2014)   Snoring    a. sleep study 5/16: No OSA   SOB (shortness of breath) 07/31/2015   Spinal stenosis    Spondylosis    Suicidal ideation 08/27/2016   a.) seen in ED with approx 6 months of reported SI. Reported ingestion of unknown pills to end life; last ingestion was 3 days PTA; UDS (+) for TCAs. SI related to depression, friend (loss of friend), and social determinants   Type II diabetes mellitus (HCC)    dx'd in 2007; lost weight; no RX for ~ 4 yr now (09/19/2014)    Past Surgical History:  Procedure Laterality Date   ANTERIOR CERVICAL DECOMP/DISCECTOMY FUSION  10/2012   in [place]  C5-C7    APPENDECTOMY  1985   BACK SURGERY  BREAST CYST EXCISION Right    BREAST EXCISIONAL BIOPSY Right YRS AGO   NEG   CARDIAC CATHETERIZATION   2000's; 2009   CHOLECYSTECTOMY  2002   COLONOSCOPY WITH PROPOFOL  N/A 12/12/2016   Procedure: COLONOSCOPY WITH PROPOFOL ;  Surgeon: [name], MD;  Location: [facility] ENDOSCOPY;  Service: Endoscopy;  Laterality: N/A;   COLONOSCOPY WITH PROPOFOL  N/A 02/17/2017   Procedure: COLONOSCOPY WITH PROPOFOL ;  Surgeon: Kung Ruel, MD;  Location: [facility] ENDOSCOPY;  Service: Gastroenterology;  Laterality: N/A;   COLONOSCOPY WITH PROPOFOL  N/A 11/04/2021   Procedure: COLONOSCOPY WITH PROPOFOL ;  Surgeon: Kung Ruel, MD;  Location: [facility] ENDOSCOPY;  Service: Gastroenterology;  Laterality: N/A;   CYSTOSCOPY N/A 05/15/2022   Procedure: CYSTOSCOPY;  Surgeon:  Leonce Garnette BIRCH, MD;  Location: [facility] ORS;  Service: Gynecology;  Laterality: N/A;   ESOPHAGOGASTRODUODENOSCOPY (EGD) WITH PROPOFOL  N/A 02/17/2017   Procedure: ESOPHAGOGASTRODUODENOSCOPY (EGD) WITH PROPOFOL ;  Surgeon: Kung Ruel, MD;  Location: [facility] ENDOSCOPY;  Service: Gastroenterology;  Laterality: N/A;   ESOPHAGOGASTRODUODENOSCOPY (EGD) WITH PROPOFOL  N/A 03/07/2019   Procedure: ESOPHAGOGASTRODUODENOSCOPY (EGD) WITH PROPOFOL ;  Surgeon: Kung Ruel, MD;  Location: [facility] ENDOSCOPY;  Service: Gastroenterology;  Laterality: N/A;   EXCISION/RELEASE BURSA HIP Right 12/1984   HERNIA REPAIR  1985   IVC FILTER REMOVAL     2020s   LEFT HEART CATHETERIZATION WITH CORONARY ANGIOGRAM N/A 12/09/2014   Procedure: LEFT HEART CATHETERIZATION WITH CORONARY ANGIOGRAM;  Surgeon: Lonni BIRCH Cash, MD;  Location: [facility] C[facility] LAB;  Service: Cardiovascular;  Laterality: N/A;   PARTIAL HYSTERECTOMY  1985   ROBOTIC ASSISTED BILATERAL SALPINGO OOPHERECTOMY Bilateral 05/15/2022   Procedure: XI ROBOTIC ASSISTED BILATERAL OOPHORECTOMY;  Surgeon: Leonce Garnette BIRCH, MD;  Location: [facility] ORS;  Service: Gynecology;  Laterality: Bilateral;   SPINE SURGERY  07/2023   TONSILLECTOMY AND ADENOIDECTOMY  ~ 1968   TOTAL HIP ARTHROPLASTY Left 04/06/2014   UMBILICAL HERNIA REPAIR  1985   VENA CAVA FILTER PLACEMENT  03/2014    Prior to Admission medications   Medication Sig Start Date End Date Taking? Authorizing Provider  acetaminophen  (TYLENOL ) 500 MG tablet Take 1 tablet (500 mg total) by mouth every 6 (six) hours as needed. Patient not taking: Reported on 02/16/2024 09/29/18   Alona Knee, PA-C  albuterol  (VENTOLIN  HFA) 108 (90 Base) MCG/ACT inhaler TAKE 2 PUFFS BY MOUTH EVERY 6 HOURS AS NEEDED FOR WHEEZE OR SHORTNESS OF BREATH Patient not taking: Reported on 02/16/2024 12/17/23   Marylynn Verneita CROME, MD  apixaban  (ELIQUIS ) 5 MG TABS tablet Take 1 tablet (5 mg total) by mouth 2 (two) times daily. 11/16/23   Marylynn Verneita CROME, MD   b complex vitamins capsule Take 1 capsule by mouth daily.    [provider]  baclofen  (LIORESAL ) 10 MG tablet TAKE ONE TABLET BY MOUTH THREE TIMES DAILY AS NEEDED FOR MUSCLE SPASMS (VIAL) 01/21/24   Pardue, Lauraine SAILOR, DO  blood glucose meter kit and supplies KIT Dispense based on patient and insurance preference. Use once daily fasting. Dx code E11.9. 08/27/21   Maribeth Camellia MATSU, MD  Blood Glucose Monitoring Suppl (GHT BLOOD GLUCOSE MONITOR) w/Device KIT DISPENSE BASED ON PATIENT AND INSURANCE PREFERENCE. USE ONCE DAILY AS DIRECTED. DX CODE E11.9. 01/14/21   Maribeth Camellia MATSU, MD  cyanocobalamin  (VITAMIN B12) 1000 MCG tablet Take 1,000 mcg by mouth daily. Gummy Patient not taking: Reported on 02/16/2024    [provider]  dicyclomine  (BENTYL ) 20 MG tablet TAKE ONE TABLET BY MOUTH THREE TIMES DAILY AS NEEDED FOR ABDOMINAL SPASMS (VIAL)  07/27/23   Maribeth Camellia MATSU, MD  empagliflozin  (JARDIANCE ) 10 MG TABS tablet Take 1 tablet (10 mg total) by mouth daily before breakfast. 01/21/24   Pardue, Lauraine SAILOR, DO  escitalopram  (LEXAPRO ) 20 MG tablet TAKE ONE TABLET BY MOUTH DAILY AT 9AM 10/24/22   Maribeth Camellia MATSU, MD  estradiol  (ESTRACE ) 0.1 MG/GM vaginal cream Place 0.5 g vaginally 2 (two) times a week. Place 0.5g nightly for two weeks then twice a week after 01/25/24   Guadlupe Dull T, MD  famotidine  (PEPCID ) 20 MG tablet TAKE 1 TABLET BY MOUTH EVERYDAY AT BEDTIME 02/19/24   [name], DO  ferrous sulfate  325 (65 FE) MG tablet Take 325 mg by mouth daily with breakfast. Patient not taking: Reported on 02/16/2024    [provider]  fluticasone  (FLONASE ) 50 MCG/ACT nasal spray SPRAY 2 SPRAYS INTO EACH NOSTRIL EVERY DAY 07/27/23   Maribeth Camellia MATSU, MD  furosemide  (LASIX ) 20 MG tablet TAKE ONE TABLET BY MOUTH DAILY AS NEEDED FOR EDEMA (VIAL) 01/12/23   Maribeth Camellia MATSU, MD  gabapentin  (NEURONTIN ) 600 MG tablet TAKE ONE TABLET BY MOUTH DAILY AT 9AM and TAKE 1 AND 1/2 TABLETS BY MOUTH  DAILY AT 9PM (VIAL) 03/17/23   Maribeth Camellia MATSU, MD  isosorbide  mononitrate (IMDUR ) 30 MG 24 hr tablet Take 0.5 tablets (15 mg total) by mouth daily. 04/10/23   Darliss Rogue, MD  levothyroxine  (SYNTHROID ) 25 MCG tablet TAKE ONE TABLET BY MOUTH DAILY AT 7AM BEFORE BREAKFAST AND 2-3 HOURS BEFORE TAKING ANY OTHER MEDICATION OR SUPPLEMENT 09/03/23   Maribeth Camellia MATSU, MD  Magnesium  200 MG TABS Take 200 mg by mouth daily.    [provider]  Multiple Vitamins-Minerals (MULTIVITAMIN GUMMIES ADULT) CHEW Chew 2 each by mouth daily.    [provider]  nitroGLYCERIN  (NITROSTAT ) 0.4 MG SL tablet Place 1 tablet (0.4 mg total) under the tongue every 5 (five) minutes as needed for chest pain. 06/26/23   Darliss Rogue, MD  OLANZapine  (ZYPREXA ) 10 MG tablet TAKE 1 TABLET BY MOUTH EVERYDAY AT BEDTIME 11/17/22   Maribeth Camellia MATSU, MD  ondansetron  (ZOFRAN -ODT) 4 MG disintegrating tablet TAKE 1 TABLET BY MOUTH EVERY 6 HOURS AS NEEDED FOR NAUSEA 12/28/23   [place], PA-C  OVER THE COUNTER MEDICATION Take 2 each by mouth daily. Power C gummies    [provider]  pantoprazole  (PROTONIX ) 40 MG tablet TAKE 1 TABLET BY MOUTH EVERY DAY 10/21/23   [place], PA-C  potassium chloride  SA (KLOR-CON  M) 20 MEQ tablet TAKE ONE TABLET BY MOUTH DAILY AS NEEDED TAKE WITH LASIX  (VIAL) 12/29/22   Maribeth Camellia MATSU, MD  Probiotic Product (PROBIOTIC BLEND) CAPS Take 1 capsule by mouth daily.    [provider]  rosuvastatin  (CRESTOR ) 40 MG tablet Take 1 tablet (40 mg total) by mouth at bedtime. 01/21/24   [name], DO  Semaglutide , 1 MG/DOSE, 4 MG/3ML SOPN Inject 1 mg as directed once a week. 02/05/24   [name], DO  sucralfate  (CARAFATE ) 1 g tablet TAKE 1 TABLET (1 G TOTAL) BY MOUTH 3 TIMES A DAY BEFORE MEALS 02/19/24   [place], PA-C  traMADol  (ULTRAM ) 50 MG tablet Take 1 tablet (50 mg total) by mouth every 12 (twelve) hours as needed for moderate pain (pain score 4-6).  02/22/24   [name], DO  triamcinolone  cream (KENALOG ) 0.5 % Apply 1 Application topically 2 (two) times daily as needed (irritation). Patient not taking: Reported on 02/16/2024  [provider]  Vibegron  (GEMTESA ) 75 MG TABS Take 1 tablet (75 mg total) by mouth daily. 01/22/24   Guadlupe Lianne DASEN, MD  Zinc  Citrate (ZINC  GUMMY PO) Take 2 each by mouth daily.    [provider]    Allergies as of 02/24/2024 - Review Complete 02/10/2024  Allergen Reaction Noted   Other Hives, Itching, and Swelling 12/09/2014   Abilify [aripiprazole] Other (See Comments) 09/20/2013   Augmentin [amoxicillin-pot clavulanate] Diarrhea and Nausea And Vomiting 09/20/2013   Bactrim [sulfamethoxazole-trimethoprim] Diarrhea 09/20/2013   Ceftin [cefuroxime axetil] Diarrhea 09/20/2013   Clavulanic acid  09/11/2022   Coconut (cocos nucifera)  10/19/2015   Diclofenac  Other (See Comments) 08/01/2014   Indocin [indomethacin] Other (See Comments) 09/20/2013   Linaclotide Diarrhea 12/09/2014   Lisinopril Diarrhea and Other (See Comments) 09/20/2013   Morphine  Other (See Comments) 12/09/2014   Morphine  and codeine  Other (See Comments) 09/20/2013   Simvastatin Other (See Comments) 08/01/2014   Sulfa antibiotics Diarrhea 12/09/2014   Wellbutrin [bupropion] Other (See Comments) 09/20/2013   Zetia  [ezetimibe ]  09/18/2017   Seroquel [quetiapine fumarate] Palpitations 08/16/2014    Family History  Problem Relation Age of Onset   Stroke Mother        Deceased   Lung cancer Father        Deceased   Healthy Daughter    Healthy Son        x 2   Other Son        Suicide   Cancer Brother    Heart attack Neg Hx     Social History   Socioeconomic History   Marital status: Divorced    Spouse name: Not on file   Number of children: Not on file   Years of education: Not on file   Highest education level: Associate degree: occupational, Scientist, product/process development, or vocational program  Occupational History   Not on  file  Tobacco Use   Smoking status: Former    Current packs/day: 0.00    Average packs/day: 1.5 packs/day for 10.0 years (15.0 ttl pk-yrs)    Types: Cigarettes    Start date: 04/11/1969    Quit date: 04/12/1979    Years since quitting: 44.9   Smokeless tobacco: Never  Vaping Use   Vaping status: Never Used  Substance and Sexual Activity   Alcohol use: No    Alcohol/week: 0.0 standard drinks of alcohol   Drug use: No   Sexual activity: Not Currently  Other Topics Concern   Not on file  Social History Narrative   Lives with fiancee in an apartment on the first floor.  Has 3 grown children.  One child passed away.  On disability 03-29-95 - 2000, 2007 - current.     Education: some college.   Caffeine rarely   retired   Teacher, early years/pre Strain: Low Risk  (02/23/2024)   Received from [facility]   Overall Financial Resource Strain (CARDIA)    Difficulty of Paying Living Expenses: Not very hard  Recent Concern: Financial Resource Strain - Medium Risk (02/16/2024)   Overall Financial Resource Strain (CARDIA)    Difficulty of Paying Living Expenses: Somewhat hard  Food Insecurity: Food Insecurity Present (02/23/2024)   Received from [facility]   Hunger Vital Sign    Within the past 12 months, you worried that your food would run out before you got the money to buy more.: Sometimes true    Within the past  12 months, the food you bought just didn't last and you didn't have money to get more.: Sometimes true  Transportation Needs: No Transportation Needs (02/23/2024)   Received from [facility] - Transportation    In the past 12 months, has lack of transportation kept you from medical appointments or from getting medications?: No    Lack of Transportation (Non-Medical): No  Physical Activity: Inactive (02/10/2024)   Exercise Vital Sign    Days of Exercise per Week: 0 days    Minutes of Exercise per  Session: 0 min  Stress: No Stress Concern Present (02/10/2024)   [employer] of Occupational Health - Occupational Stress Questionnaire    Feeling of Stress : Not at all  Social Connections: Moderately Integrated (02/10/2024)   Social Connection and Isolation Panel    Frequency of Communication with Friends and Family: More than three times a week    Frequency of Social Gatherings with Friends and Family: Once a week    Attends Religious Services: More than 4 times per year    Active Member of [employer] or Organizations: Yes    Attends Engineer, structural: More than 4 times per year    Marital Status: Divorced  Intimate Partner Violence: Not At Risk (02/10/2024)   Humiliation, Afraid, Rape, and Kick questionnaire    Fear of Current or Ex-Partner: No    Emotionally Abused: No    Physically Abused: No    Sexually Abused: No    Review of Systems: See HPI, otherwise negative ROS  Physical Exam: There were no vitals taken for this visit. General:   Alert,  pleasant and cooperative in NAD Head:  Normocephalic and atraumatic. Neck:  Supple; no masses or thyromegaly. Lungs:  Clear throughout to auscultation, normal respiratory effort.    Heart:  +S1, +S2, Regular rate and rhythm, No edema. Abdomen:  Soft, nontender and nondistended. Normal bowel sounds, without guarding, and without rebound.   Neurologic:  Alert and  oriented x4;  grossly normal neurologically.  Impression/Plan: Valerie Vazquez is here for an endoscopy  to be performed for  evaluation of dysphagia.     Risks, benefits, limitations, and alternatives regarding endoscopy have been reviewed with the patient.  Questions have been answered.  All parties agreeable.   [name], MD  03/02/2024, 8:31 AM

## 2024-03-02 NOTE — Transfer of Care (Signed)
 Immediate Anesthesia Transfer of Care Note  Patient: Valerie Vazquez  Procedure(s) Performed: EGD (ESOPHAGOGASTRODUODENOSCOPY)  Patient Location: PACU  Anesthesia Type:General  Level of Consciousness: awake, alert , and oriented  Airway & Oxygen  Therapy: Patient Spontanous Breathing  Post-op Assessment: Report given to RN and Post -op Vital signs reviewed and stable  Post vital signs: Reviewed and stable  Last Vitals:  Vitals Value Taken Time  BP 140/78 03/02/24 09:19  Temp 35.6 C 03/02/24 09:19  Pulse 66 03/02/24 09:20  Resp 18 03/02/24 09:20  SpO2 93 % 03/02/24 09:20  Vitals shown include unfiled device data.  Last Pain:  Vitals:   03/02/24 0919  TempSrc: Temporal  PainSc: 0-No pain         Complications: No notable events documented.

## 2024-03-02 NOTE — Anesthesia Postprocedure Evaluation (Signed)
 Anesthesia Post Note  Patient: Valerie Vazquez  Procedure(s) Performed: EGD (ESOPHAGOGASTRODUODENOSCOPY)  Patient location during evaluation: Endoscopy Anesthesia Type: General Level of consciousness: awake and alert Pain management: pain level controlled Vital Signs Assessment: post-procedure vital signs reviewed and stable Respiratory status: spontaneous breathing, nonlabored ventilation and respiratory function stable Cardiovascular status: blood pressure returned to baseline and stable Postop Assessment: no apparent nausea or vomiting Anesthetic complications: no   No notable events documented.   Last Vitals:  Vitals:   03/02/24 0935 03/02/24 0939  BP: (!) 147/81 (!) 153/82  Pulse: 62 62  Resp: 20 17  Temp:    SpO2: 95% 97%    Last Pain:  Vitals:   03/02/24 0939  TempSrc:   PainSc: 2                  Camellia Merilee Louder

## 2024-03-02 NOTE — Anesthesia Preprocedure Evaluation (Addendum)
 Anesthesia Evaluation  Patient identified by MRN, date of birth, ID band Patient awake    Reviewed: Allergy & Precautions, H&P , NPO status , Patient's Chart, lab work & pertinent test results  History of Anesthesia Complications (+) PONV and history of anesthetic complications  Airway Mallampati: II  TM Distance: >3 FB Neck ROM: full    Dental no notable dental hx.    Pulmonary sleep apnea , former smoker  09/19/2014: History of pulmonary embolus (PE)   Pulmonary exam normal        Cardiovascular Exercise Tolerance: Good hypertension, + angina (pt reports is difficult ot distinguish between GI symptoms. It is stable and low occurrance)  + CAD, + Past MI, + Peripheral Vascular Disease and +CHF  Normal cardiovascular exam  History of nuclear stress test     Comment:  Myoview  1/19:  EF 65, no ischemia or scar  Orthostatic Hypotension; coronary vasospasm; chronic diastolic heart failure   Neuro/Psych  PSYCHIATRIC DISORDERS  Depression  Schizophrenia  s/p ACDF C5-C7    GI/Hepatic negative GI ROS, Neg liver ROS,,,  Endo/Other  diabetes, Type 2Hypothyroidism    Renal/GU negative Renal ROS  negative genitourinary   Musculoskeletal   Abdominal   Peds  Hematology negative hematology ROS (+)   Anesthesia Other Findings Past Medical History: 10/23/2017: Acute right-sided low back pain with right-sided sciatica No date: Anemia No date: Anginal pain (HCC) No date: Bilateral ovarian cysts No date: Bilateral renal cysts 05/07/2021: Bulimia nervosa 09/28/2016: CAD (coronary artery disease) 10/14/2015: Chest pain No date: CHF (congestive heart failure) (HCC)     Comment:  Diastolic No date: Chronic back pain     Comment:  upper and lower (09/19/2014) 01/29/2016: Chronic shoulder pain (Location of Primary Source of  Pain) (Left) 04/08/2016: Colitis 01/30/2017: Colon polyp 12/09/2014: Coronary vasospasm (HCC) No date:  DDD (degenerative disc disease), cervical     Comment:  a.) s/p ACDF C5-C7 No date: Depression No date: DVT (deep venous thrombosis) (HCC) No date: Esophageal spasm 10/28/2013: Essential hypertension No date: Gallstones No date: Gastritis No date: Gastroenteritis No date: GERD (gastroesophageal reflux disease) 12/09/2016: Heart disease No date: High blood pressure No date: High cholesterol 1985: History of blood transfusion     Comment:  related to hysterectomy 12/09/2014: History of left heart catheterization (LHC)     Comment:  a.) LHC 12/09/2014: EF 50%; normal coronaries. No date: History of nuclear stress test     Comment:  Myoview  1/19:  EF 65, no ischemia or scar 09/19/2014: History of pulmonary embolus (PE) 01/29/2016: Hx of cervical spine surgery 02/13/2015: Hypercementosis     Comment:  Per patient diagnosed in [place] . Rule out Paget's               disease of the bone with blood work.  10/28/2013: Hyperlipidemia No date: Hypertension No date: Hypothyroidism No date: IBS (irritable bowel syndrome) No date: Long term current use of anticoagulant     Comment:  a.) apixaban  01/29/2016: Lumbar central spinal stenosis (moderate at L4-5; mild at  L2-3 and L3-4) No date: Migraine     Comment:  a few times/yr (09/19/2014) 10/2010 X 3: Myocardial infarction [facility])     Comment:  while hospitalized 01/29/2016: Neck pain No date: Osteoarthritis No date: Peripheral vascular disease (HCC)     Comment:  DVT/PE. On Eliquis  07/31/2015: Pleural effusion, bilateral 04/2014: Pneumonia No date: PONV (postoperative nausea and vomiting) No date: PTSD (post-traumatic stress disorder)     Comment:  Hx. None  currently (2024) 04/2001; 09/19/2014: Pulmonary embolism (HCC)     Comment:  after gallbladder OR;  08/28/2016: Schizoaffective disorder, depressive type (HCC) No date: Sleep apnea     Comment:  mild (09/19/2014) No date: Snoring     Comment:  a. sleep study 5/16: No  OSA 07/31/2015: SOB (shortness of breath) No date: Spinal stenosis No date: Spondylosis 08/27/2016: Suicidal ideation     Comment:  a.) seen in ED with approx 6 months of reported SI.               Reported ingestion of unknown pills to end life; last               ingestion was 3 days PTA; UDS (+) for TCAs. SI related to              depression, friend (loss of friend), and social               determinants No date: Type II diabetes mellitus (HCC)     Comment:  dx'd in 2007; lost weight; no RX for ~ 4 yr now               (09/19/2014)  Past Surgical History: 10/2012: ANTERIOR CERVICAL DECOMP/DISCECTOMY FUSION     Comment:  in [place]  C5-C7  1985: APPENDECTOMY No date: BACK SURGERY No date: BREAST CYST EXCISION; Right YRS AGO: BREAST EXCISIONAL BIOPSY; Right     Comment:  NEG  2000's; 2009: CARDIAC CATHETERIZATION 2002: CHOLECYSTECTOMY 12/12/2016: COLONOSCOPY WITH PROPOFOL ; N/A     Comment:  Procedure: COLONOSCOPY WITH PROPOFOL ;  Surgeon: [name], MD;  Location: [facility] ENDOSCOPY;  Service: Endoscopy;              Laterality: N/A; 02/17/2017: COLONOSCOPY WITH PROPOFOL ; N/A     Comment:  Procedure: COLONOSCOPY WITH PROPOFOL ;  Surgeon: Kung Ruel, MD;  Location: [facility] ENDOSCOPY;  Service:               Gastroenterology;  Laterality: N/A; 11/04/2021: COLONOSCOPY WITH PROPOFOL ; N/A     Comment:  Procedure: COLONOSCOPY WITH PROPOFOL ;  Surgeon: Kung Ruel, MD;  Location: [facility] ENDOSCOPY;  Service:               Gastroenterology;  Laterality: N/A; 05/15/2022: CYSTOSCOPY; N/A     Comment:  Procedure: CYSTOSCOPY;  Surgeon: Leonce Garnette BIRCH, MD;              Location: [facility] ORS;  Service: Gynecology;  Laterality:               N/A; 02/17/2017: ESOPHAGOGASTRODUODENOSCOPY (EGD) WITH PROPOFOL ; N/A     Comment:  Procedure: ESOPHAGOGASTRODUODENOSCOPY (EGD) WITH               PROPOFOL ;  Surgeon: Kung Ruel, MD;  Location: [facility]                ENDOSCOPY;  Service: Gastroenterology;  Laterality: N/A; 03/07/2019: ESOPHAGOGASTRODUODENOSCOPY (EGD) WITH PROPOFOL ; N/A     Comment:  Procedure: ESOPHAGOGASTRODUODENOSCOPY (EGD) WITH               PROPOFOL ;  Surgeon: Kung Ruel, MD;  Location: [facility]               ENDOSCOPY;  Service:  Gastroenterology;  Laterality: N/A; 12/1984: EXCISION/RELEASE BURSA HIP; Right 1985: HERNIA REPAIR No date: IVC FILTER REMOVAL     Comment:  2020s 12/09/2014: LEFT HEART CATHETERIZATION WITH CORONARY ANGIOGRAM; N/A     Comment:  Procedure: LEFT HEART CATHETERIZATION WITH CORONARY               ANGIOGRAM;  Surgeon: Lonni JONETTA Cash, MD;                Location: [facility] CATH LAB;  Service: Cardiovascular;                Laterality: N/A; 1985: PARTIAL HYSTERECTOMY 05/15/2022: ROBOTIC ASSISTED BILATERAL SALPINGO OOPHERECTOMY;  Bilateral     Comment:  Procedure: XI ROBOTIC ASSISTED BILATERAL OOPHORECTOMY;                Surgeon: Leonce Garnette JONETTA, MD;  Location: [facility] ORS;                Service: Gynecology;  Laterality: Bilateral; 07/2023: SPINE SURGERY ~ 1968: TONSILLECTOMY AND ADENOIDECTOMY 04/06/2014: TOTAL HIP ARTHROPLASTY; Left 1985: UMBILICAL HERNIA REPAIR 03/2014: VENA CAVA FILTER PLACEMENT  BMI    Body Mass Index: 30.34 kg/m      Reproductive/Obstetrics negative OB ROS                              Anesthesia Physical Anesthesia Plan  ASA: 3  Anesthesia Plan: General   Post-op Pain Management:    Induction: Intravenous  PONV Risk Score and Plan: Propofol  infusion and TIVA  Airway Management Planned: Natural Airway  Additional Equipment:   Intra-op Plan:   Post-operative Plan:   Informed Consent: I have reviewed the patients History and Physical, chart, labs and discussed the procedure including the risks, benefits and alternatives for the proposed anesthesia with the patient or authorized representative who has indicated his/her understanding and  acceptance.     Dental Advisory Given  Plan Discussed with: CRNA and Surgeon  Anesthesia Plan Comments:          Anesthesia Quick Evaluation

## 2024-03-03 ENCOUNTER — Ambulatory Visit: Attending: Family Medicine | Admitting: Physical Therapy

## 2024-03-03 DIAGNOSIS — E039 Hypothyroidism, unspecified: Secondary | ICD-10-CM | POA: Diagnosis not present

## 2024-03-03 DIAGNOSIS — M5459 Other low back pain: Secondary | ICD-10-CM | POA: Insufficient documentation

## 2024-03-03 DIAGNOSIS — E119 Type 2 diabetes mellitus without complications: Secondary | ICD-10-CM | POA: Diagnosis not present

## 2024-03-03 LAB — SURGICAL PATHOLOGY

## 2024-03-03 NOTE — Therapy (Addendum)
 OUTPATIENT PHYSICAL THERAPY THORACOLUMBAR TREATMENT    Patient Name: Valerie Vazquez MRN: [id number] DOB:10/22/1955, 68 y.o., female Today's Date: 03/03/2024  END OF SESSION:    03/03/24 1245  PT Visits / Re-Eval  Visit Number 6  Number of Visits 24  Date for PT Re-Evaluation 05/10/24  Authorization  Authorization Type [employer] 2025  Authorization - Visit Number 6  Authorization - Number of Visits 24  Progress Note Due on Visit 10  PT Time Calculation  PT Start Time 1115  PT Stop Time 1200  PT Time Calculation (min) 45 min  PT - End of Session  Activity Tolerance Patient limited by pain  Behavior During Therapy [facility] for tasks assessed/performed       Past Medical History:  Diagnosis Date   Acute right-sided low back pain with right-sided sciatica 10/23/2017   Anemia    Anginal pain (HCC)    Bilateral ovarian cysts    Bilateral renal cysts    Bulimia nervosa 05/07/2021   CAD (coronary artery disease) 09/28/2016   Chest pain 10/14/2015   CHF (congestive heart failure) (HCC)    Diastolic   Chronic back pain    upper and lower (09/19/2014)   Chronic shoulder pain (Location of Primary Source of Pain) (Left) 01/29/2016   Colitis 04/08/2016   Colon polyp 01/30/2017   Coronary vasospasm (HCC) 12/09/2014   DDD (degenerative disc disease), cervical    a.) s/p ACDF C5-C7   Depression    DVT (deep venous thrombosis) (HCC)    Esophageal spasm    Essential hypertension 10/28/2013   Gallstones    Gastritis    Gastroenteritis    GERD (gastroesophageal reflux disease)    Heart disease 12/09/2016   High blood pressure    High cholesterol    History of blood transfusion 1985   related to hysterectomy   History of left heart catheterization (LHC) 12/09/2014   a.) LHC 12/09/2014: EF 50%; normal coronaries.   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 Rule out Paget's disease of the bone with blood work.    Hyperlipidemia 10/28/2013   Hypertension    Hypothyroidism    IBS (irritable bowel syndrome)    Long term current use of anticoagulant    a.) apixaban    Lumbar central spinal stenosis (moderate at L4-5; mild at L2-3 and L3-4) 01/29/2016   Migraine    a few times/yr (09/19/2014)   Myocardial infarction (HCC) 10/2010 X 3   while hospitalized   Neck pain 01/29/2016   Osteoarthritis    Peripheral vascular disease (HCC)    DVT/PE. On Eliquis    Pleural effusion, bilateral 07/31/2015   Pneumonia 04/2014   PONV (postoperative nausea and vomiting)    PTSD (post-traumatic stress disorder)    Hx. None currently (2024)   Pulmonary embolism (HCC) 04/2001; 09/19/2014   after gallbladder OR;    Schizoaffective disorder, depressive type (HCC) 08/28/2016   Sleep apnea    mild (09/19/2014)   Snoring    a. sleep study 5/16: No OSA   SOB (shortness of breath) 07/31/2015   Spinal stenosis    Spondylosis    Suicidal ideation 08/27/2016   a.) seen in ED with approx 6 months of reported SI. Reported ingestion of unknown pills to end life; last ingestion  was 3 days PTA; UDS (+) for TCAs. SI related to depression, friend (loss of friend), and social determinants   Type II diabetes mellitus (HCC)    dx'd in 2007; lost weight; no RX for ~ 4 yr now (09/19/2014)   Past Surgical History:  Procedure Laterality Date   ANTERIOR CERVICAL DECOMP/DISCECTOMY FUSION  10/2012   in [place]  C5-C7    APPENDECTOMY  1985   BACK SURGERY     BREAST CYST EXCISION Right    BREAST EXCISIONAL BIOPSY Right YRS AGO   NEG   CARDIAC CATHETERIZATION   2000's; 2009   CHOLECYSTECTOMY  2002   COLONOSCOPY WITH PROPOFOL  N/A 12/12/2016   Procedure: COLONOSCOPY WITH PROPOFOL ;  Surgeon: [name], MD;  Location: [facility] ENDOSCOPY;  Service: Endoscopy;  Laterality: N/A;   COLONOSCOPY WITH  PROPOFOL  N/A 02/17/2017   Procedure: COLONOSCOPY WITH PROPOFOL ;  Surgeon: Kung Ruel, MD;  Location[facility] ENDOSCOPY;  Service: Gastroenterology;  Laterality: N/A;   COLONOSCOPY WITH PROPOFOL  N/A 11/04/2021   Procedure: COLONOSCOPY WITH PROPOFOL ;  Surgeon: Kung Ruel, MD;  Location: [facility] ENDOSCOPY;  Service: Gastroenterology;  Laterality: N/A;   CYSTOSCOPY N/A 05/15/2022   Procedure: CYSTOSCOPY;  Surgeon: Leonce Garnette BIRCH, MD;  Location: [facility] ORS;  Service: Gynecology;  Laterality: N/A;   ESOPHAGOGASTRODUODENOSCOPY N/A 03/02/2024   Procedure: EGD (ESOPHAGOGASTRODUODENOSCOPY);  Surgeon: Kung Ruel, MD;  Location: [facility] ENDOSCOPY;  Service: Gastroenterology;  Laterality: N/A;  DM also on OZEMPIC    ESOPHAGOGASTRODUODENOSCOPY (EGD) WITH PROPOFOL  N/A 02/17/2017   Procedure: ESOPHAGOGASTRODUODENOSCOPY (EGD) WITH PROPOFOL ;  Surgeon: Kung Ruel, MD;  Location: [facility] ENDOSCOPY;  Service: Gastroenterology;  Laterality: N/A;   ESOPHAGOGASTRODUODENOSCOPY (EGD) WITH PROPOFOL  N/A 03/07/2019   Procedure: ESOPHAGOGASTRODUODENOSCOPY (EGD) WITH PROPOFOL ;  Surgeon: Kung Ruel, MD;  Location: [facility] ENDOSCOPY;  Service: Gastroenterology;  Laterality: N/A;   EXCISION/RELEASE BURSA HIP Right 12/1984   HERNIA REPAIR  1985   IVC FILTER REMOVAL     2020s   LEFT HEART CATHETERIZATION WITH CORONARY ANGIOGRAM N/A 12/09/2014   Procedure: LEFT HEART CATHETERIZATION WITH CORONARY ANGIOGRAM;  Surgeon: Lonni BIRCH Cash, MD;  Location: [facility] CATH LAB;  Service: Cardiovascular;  Laterality: N/A;   PARTIAL HYSTERECTOMY  1985   ROBOTIC ASSISTED BILATERAL SALPINGO OOPHERECTOMY Bilateral 05/15/2022   Procedure: XI ROBOTIC ASSISTED BILATERAL OOPHORECTOMY;  Surgeon: Leonce Garnette BIRCH, MD;  Location: [facility] ORS;  Service: Gynecology;  Laterality: Bilateral;   SPINE SURGERY  07/2023   TONSILLECTOMY AND ADENOIDECTOMY  ~ 1968   TOTAL HIP ARTHROPLASTY Left 04/06/2014   UMBILICAL HERNIA REPAIR  1985   VENA CAVA FILTER PLACEMENT   03/2014   Patient Active Problem List   Diagnosis Date Noted   Vaginal discharge 01/23/2024   Urethral prolapse 01/23/2024   Nocturia 01/23/2024   Incontinence of feces 01/22/2024   History of recurrent UTI (urinary tract infection) 01/22/2024   Decreased glomerular filtration rate (GFR) 01/21/2024   Influenza A 10/12/2023   Spondylolisthesis at L4-L5 level 07/27/2023   Left leg numbness 05/20/2023   Thoracic back pain 03/13/2023   Obesity (BMI 30.0-34.9) 12/10/2022   Subcutaneous nodule 06/09/2022   Numbness of right foot 03/05/2022   OAB (overactive bladder) 03/05/2022   Ingrown fingernail 12/27/2021   Cramps, extremity 11/27/2021   Skin lesion of lower extremity- right knee  09/26/2021   Retention cyst of nasal sinus 08/27/2021   Rash 08/27/2021   Bulimia 01/14/2021   Cervical radiculitis 11/15/2020   Right arm pain 11/12/2020   Bilateral low back pain without sciatica 06/20/2020   Paronychia of  great toe of right foot 04/05/2020   Paronychia of great toe, right 02/27/2020   Foraminal stenosis of cervical region 01/31/2020   Cervical fusion syndrome (C5-C7 2014) 01/31/2020   Ingrown toenail 01/27/2020   Chronic diastolic heart failure (HCC) 01/27/2020   Postmenopausal estrogen deficiency 08/12/2019   Left wrist pain 05/20/2019   Allergic rhinitis 01/27/2019   Pain of right lower extremity 10/06/2018   Mixed stress and urge urinary incontinence 10/27/2017   Elevated alkaline phosphatase level 10/27/2017   Hypothyroidism 08/26/2017   Type 2 diabetes mellitus with other specified complication (HCC) 05/29/2017   Right upper quadrant pain 01/12/2017   Plantar fasciitis of left foot 11/07/2016   CAD (coronary artery disease) 09/28/2016   Epigastric discomfort 09/16/2016   Schizoaffective disorder, depressive type (HCC) 08/28/2016   Cervicalgia 01/29/2016   Chronic low back pain (Location of Secondary source of pain) (Bilateral) (R>L) 01/29/2016   Chronic pain syndrome  01/29/2016   Primary osteoarthritis of right hip 01/29/2016   Lumbar central spinal stenosis (moderate at L4-5; mild at L2-3 and L3-4) 01/29/2016   Breast pain 10/19/2015   Abdominal pain 10/14/2015   Cough 07/31/2015   Esophageal spasm 06/05/2015   History of DVT (deep vein thrombosis) 02/25/2015   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 Lauraine Buoy    REFERRING PROVIDER: Dr. Arley Helling   REFERRING DIAG: M54.40 (ICD-10-CM) - Lumbago of lumbar region with sciatica   Rationale for Evaluation and Treatment: Rehabilitation  THERAPY DIAG:  Other low back pain  ONSET DATE: 12/01/23  SUBJECTIVE:                                                                                                                                                                                           SUBJECTIVE STATEMENT: Pt has ongoing low back pain that continues to radiates across her low back and she recently had a colonoscopy and she said it went well.  PERTINENT HISTORY:  Pt reports that had lumbar spinal fusion in November of 2024 and she has since developed soreness and pain in her low back that is similar to what she was feeling before the surgery. She has also had surgery on her cervical spine in September of last year and she has had a left hip  replacement. She has since weaned off Tylenol  because of liver issues, so her pain is not as well controlled and she is seeking further medical treatment and has an appointment scheduled with her physician. She is also concerned about her liver function and she is following up  with physician for  CT scan.   PAIN:  Are you having pain? Yes: NPRS scale: 4/10  Pain location: Central spinous process of low back and wraps around anteriorly towards ASIS  Pain description: Dull pain and fatigue when standing and walking for long periods of time   Aggravating factors: Walking around and laying down  Relieving factors: Sitting Down, but not for a long time.   PRECAUTIONS: None  RED FLAGS: None   WEIGHT BEARING RESTRICTIONS: No  FALLS:  Has patient fallen in last 6 months? No, she had a fall in September of last year when standing up from bed because she could not feel her left foot and she lost her balance.   LIVING ENVIRONMENT: Lives with: lives alone Lives in: House/apartment Stairs: Yes: External: 3 steps; on right going up Has following equipment at home: Single point cane, Walker - 2 wheeled, Wheelchair (manual), shower chair, Tour manager, and Grab bars  OCCUPATION: Not currently working.   PLOF: Independent  PATIENT GOALS: She is limited in doing housework activities because of her low back pain. She is unable to clean up her house as a result.   NEXT MD VISIT: Doctor will schedule apt after her CT scan of low back and liver   OBJECTIVE:  Note: Objective measures were completed at Evaluation unless otherwise noted.  VITALS: BP 116/74HR 72 Sp02 98  DIAGNOSTIC FINDINGS:  CLINICAL DATA:  Chronic low back pain, right leg pain for few years   EXAM: MRI LUMBAR SPINE WITHOUT CONTRAST   TECHNIQUE: Multiplanar, multisequence MR imaging of the lumbar spine was performed. No intravenous contrast was administered.   COMPARISON:  12/25/2021, 06/21/2020   FINDINGS: Segmentation:  Standard.   Alignment:  Minimal grade 1 anterolisthesis of L4 on L5.   Vertebrae: No acute fracture, evidence of discitis, or aggressive bone lesion.   Conus medullaris and cauda equina: Conus extends to the L1-2 level. Conus and cauda equina appear normal.    Paraspinal and other soft tissues: No acute paraspinal abnormality.   Disc levels:   Disc spaces: Degenerative disease with disc height loss at L4-5.   T12-L1: No significant disc bulge. No neural foraminal stenosis. No central canal stenosis.   L1-L2: Mild broad-based disc bulge. Moderate bilateral facet arthropathy. No foraminal or central canal stenosis.   L2-L3: Minimal broad-based disc bulge. Severe bilateral facet arthropathy. Moderate spinal stenosis. Right lateral recess stenosis. Mild right foraminal stenosis. No left foraminal stenosis.   L3-L4: Mild broad-based disc bulge. Severe bilateral facet arthropathy. Severe spinal stenosis. Mild right foraminal stenosis. No left foraminal stenosis.   L4-L5: Broad-based disc bulge. Severe bilateral facet arthropathy with ligamentum flavum infolding. Moderate-severe spinal stenosis. Moderate right foraminal stenosis. No left foraminal stenosis. Bilateral lateral recess stenosis.   L5-S1: Mild broad-based disc bulge. Severe bilateral facet arthropathy. No foraminal or central canal stenosis.   IMPRESSION: 1. Diffuse lumbar spine spondylosis as described above. 2. No acute osseous injury of the lumbar spine.     Electronically Signed   By: Julaine Blanch M.D.   On: 06/10/2023 09:52  PATIENT SURVEYS:  Modified Oswestry NT    COGNITION: Overall cognitive status: Within functional limits for tasks assessed  SENSATION: Light touch: Impaired  Neuropathy in bilateral feet    MUSCLE LENGTH: Hamstrings: Right 70 deg; Left 70 deg   POSTURE: No Significant postural limitations  PALPATION: L2-L5 CSP   LUMBAR ROM:   AROM eval  Flexion 50%*  Extension 50%*  Right lateral flexion 50%*  Left lateral flexion 50%*  Right rotation 100%  Left rotation 100%   (Blank rows = not tested)  LOWER EXTREMITY ROM:     Active  Right eval Left eval  Hip flexion    Hip extension    Hip abduction    Hip adduction    Hip  internal rotation    Hip external rotation    Knee flexion    Knee extension    Ankle dorsiflexion    Ankle plantarflexion    Ankle inversion    Ankle eversion     (Blank rows = not tested)  LOWER EXTREMITY MMT:    MMT Right eval Left eval  Hip flexion 4- 4-   Hip extension 3- 3-  Hip abduction 4- 4  Hip adduction    Hip internal rotation NT  NT   Hip external rotation NT NT   Knee flexion 4 4  Knee extension 4 4  Ankle dorsiflexion 4 4  Ankle plantarflexion    Ankle inversion    Ankle eversion     (Blank rows = not tested)  Abdominal Strength Testing: Sahrmann Testing  Level 4    LUMBAR SPECIAL TESTS:  Straight leg raise test: Negative, SI Compression/distraction test: NT , FABER test: Positive, and FADIR Positive Bilateral   FUNCTIONAL TESTS:  5 times sit to stand: NT  30 seconds chair stand test 10 meter walk test: NT     MODI: 20/50=40%  TREATMENT DATE:   03/03/24: TM for 5 min with BUE support at 0.8 mph   Seated Lumbar Flexion AAROM Silver [employer] [facility], Right, and Left 3 x 20   Seated Hip ER Stretch R/L 5 x 60 sec   -Pt reports increased stretch on left hip but not on right hip   Seated HS Stretch 4 x 60 sec    Squats with ball roll support on wall 1 x 10   Mini-Squats with taps on surface 2 x 10  Modified Bird Dog with no UE Extension 1 x 10   Modified Bird Dog non-alternating 2 x 10   Lumbar Flexion AAROM Center, Right, and Left    PATIENT EDUCATION:  Education details: Form and technique for correct performance of exercise.  Person educated: Patient Education method: Explanation, Demonstration, Verbal cues, and Handouts Education comprehension: verbalized understanding, returned demonstration, and verbal cues required  HOME EXERCISE PROGRAM: Access Code: [email or web address] URL: [email or web address] Date: 03/03/2024 Prepared by: Toribio Servant  Program Notes Towel slides on counter center, right, left 30 reps every  day  Exercises - Bound Angle Hands Forward   - 1 x daily - 7 x weekly - 3 reps - 30-60 sec  hold - Seated Hip External Rotation Stretch  - 1 x daily - 7 x weekly - 3 reps - 30-60 sec  hold - Prone Quad Stretch with Towel Roll and Strap  - 1 x daily - 7 x weekly - 3 reps - 60 sec hold - Supine Lower Trunk Rotation  - 1 x daily - 7 x weekly - 2 sets - 10 reps - 3 sec hold - Seated Hamstring Stretch  - 1 x daily - 7 x weekly - 3  reps - 60 sec hold - Seated Transversus Abdominis Bracing  - 1 x daily - 7 x weekly - 2 sets - 10 reps - 5 sec  hold - Clamshell with Resistance  - 3-4 x weekly - 3 sets - 10 reps - Modified Bird Dog At Wall  - 3-4 x weekly - 2 sets - 10 reps - Mini Squat  - 3-4 x weekly - 3 sets - 10 reps   ASSESSMENT:  CLINICAL IMPRESSION: Pt shows improvement with activity tolerance with hip and lumbar strengthening exercises with ability to perform mini-squats and hip extension in a position of increased lumbar extension. She did not have an increase in her low back pain when performing these exercises, which is an improvement from when she first attempted these type of exercises at start of her plan of care. Pt continues to be concerned about her pain not being well controlled, because she is not able to take NSAIDs due to risk of blood clot. PT recommended that pt discuss this with her surgeon or referring physician to discuss other pain management techniques. She will continue to benefit from skilled PT to address these aforementioned deficits to return to standing and walking for prolonged of time to return to washing dishes and cleaning her home and to decrease risk falls.    OBJECTIVE IMPAIRMENTS: Abnormal gait, decreased activity tolerance, decreased balance, decreased endurance, decreased mobility, difficulty walking, decreased ROM, decreased strength, hypomobility, increased muscle spasms, impaired flexibility, impaired sensation, obesity, and pain.   ACTIVITY LIMITATIONS:  carrying, lifting, bending, sitting, standing, squatting, sleeping, stairs, bathing, dressing, hygiene/grooming, and locomotion level  PARTICIPATION LIMITATIONS: meal prep, cleaning, laundry, driving, shopping, and community activity  PERSONAL FACTORS: Age, Fitness, Time since onset of injury/illness/exacerbation, and 3+ comorbidities: HTN, HLD, Depression, Obesity, and T2DM are also affecting patient's functional outcome.   REHAB POTENTIAL: Fair chronicity of condition and prior interventions including low back surgery  CLINICAL DECISION MAKING: Evolving/moderate complexity  EVALUATION COMPLEXITY: Moderate   GOALS: Goals reviewed with patient? No  SHORT TERM GOALS: Target date: 03/01/2024  Patient will demonstrate undestanding of home exercise plan by performing exercises correctly with evidence of good carry over with min to no verbal or tactile cues .   Baseline: NT 02/29/24: Performing independently   Goal status: ACHIEVED    2.  Patient will show decreased risk of falling as evidence by decrease in self-selected gait speed by >= 0.12 m/sec.  Baseline: 0.82 m/sec  02/29/24: 0.91 m/sec   Goal status: PARTIALLY MET     LONG TERM GOALS: Target date: 05/11/2024  Patient will improve modified Oswestry Disability Index (MODI) score by >=13 points as evidence of the minimal statistically significant change for improvement with low back pain disability and improvement in low back function (Copay et al, 2008) Baseline: 40% Goal status: ONGOING    2.  Patient will improve hip strength by 1/3 grade (I.E 4- to 4+) and abdominal strength by >= 1 grade for improved lumbar stability and to assist low back in absorbing mechanical forces in weight bearing positions for improved symptom response. Baseline: Hip MMT Flex R/L 4-/4-, Hip Ext R/L 3-/3-, Sahrmann level 4    Goal status: ONGOING     3.  Patient will perform 5 x STS in <12.6 sec to demonstrate improve LE strength to decrease risk of  falling (Bohannon, 2006). Baseline: 18 sec  Goal status: ONGOING    4.  Patient will perform in >=1.0 m./sec as evidence of improved  mobility, decreased falls risk, and overall functional independence.  Baseline: 0.82 m/sec 02/29/24:  0.91 m/sec   Goal status: ONGOING    5.  Patient will demonstrate improved low back pain by being able to stand for >=5 min in order to wash dishes and clean home.  Baseline: NT Goal status: ONGOING    6.  Patient will be able to walk for >=10 min without being limited by low back pain to the point where she needs to take a seated or standing rest break as evidence of improved low back function and to return to walking community level distances.  Baseline:  NT  Goal status: ONGOING    PLAN:  PT FREQUENCY: 1-2x/week  PT DURATION: 12 weeks  PLANNED INTERVENTIONS: [id number]- PT Re-evaluation, 97750- Physical Performance Testing, 97110-Therapeutic exercises, 97530- Therapeutic activity, [id number]- Neuromuscular re-education, 97535- Self Care, [id number]- Manual therapy, [id number]- Gait training, [phone]- Orthotic Initial, [phone]- Orthotic/Prosthetic subsequent, [phone]- Canalith repositioning, [id number]- Aquatic Therapy, H9716- Electrical stimulation (unattended), [phone]- Electrical stimulation (manual), [id number]- Ultrasound, [id number]- Traction (mechanical), [id number]- Wound care (first 20 sq cm), 97598- Wound care (each additional 20 sq cm), 20560 (1-2 muscles), 20561 (3+ muscles)- Dry Needling, Patient/Family education, Balance training, Stair training, Joint mobilization, Joint manipulation, Spinal manipulation, Spinal mobilization, Vestibular training, DME instructions, Cryotherapy, and Moist heat.  PLAN FOR NEXT SESSION: Towel Slide Warm Up. Hip ER Stretch. Standing heel raises and marches and hip abduction . Cat Cow? Open Books for thoracic mobility.    Toribio Servant PT, DPT  [facility] [address] [place], [address] Phone:  [phone]   Fax:  [phone]

## 2024-03-07 ENCOUNTER — Ambulatory Visit: Admitting: Physical Therapy

## 2024-03-07 DIAGNOSIS — M5459 Other low back pain: Secondary | ICD-10-CM | POA: Diagnosis not present

## 2024-03-07 DIAGNOSIS — E119 Type 2 diabetes mellitus without complications: Secondary | ICD-10-CM | POA: Diagnosis not present

## 2024-03-07 DIAGNOSIS — Z5982 Transportation insecurity: Secondary | ICD-10-CM | POA: Diagnosis not present

## 2024-03-07 DIAGNOSIS — K582 Mixed irritable bowel syndrome: Secondary | ICD-10-CM | POA: Diagnosis not present

## 2024-03-07 DIAGNOSIS — K2100 Gastro-esophageal reflux disease with esophagitis, without bleeding: Secondary | ICD-10-CM | POA: Diagnosis not present

## 2024-03-07 NOTE — Therapy (Signed)
 OUTPATIENT PHYSICAL THERAPY THORACOLUMBAR TREATMENT    Patient Name: Valerie Vazquez MRN: [id number] DOB:09-28-1955, 68 y.o., female Today's Date: 03/07/2024  END OF SESSION:   PT End of Session - 03/07/24 1546     Visit Number 7    Number of Visits 24    Date for PT Re-Evaluation 05/10/24    Authorization Type [employer] Medicare 2025    Authorization - Visit Number 7    Authorization - Number of Visits 24    Progress Note Due on Visit 10    PT Start Time 1540    PT Stop Time 1620    PT Time Calculation (min) 40 min    Activity Tolerance Patient limited by pain    Behavior During Therapy [facility] for tasks assessed/performed           Past Medical History:  Diagnosis Date   Acute right-sided low back pain with right-sided sciatica 10/23/2017   Anemia    Anginal pain (HCC)    Bilateral ovarian cysts    Bilateral renal cysts    Bulimia nervosa 05/07/2021   CAD (coronary artery disease) 09/28/2016   Chest pain 10/14/2015   CHF (congestive heart failure) (HCC)    Diastolic   Chronic back pain    upper and lower (09/19/2014)   Chronic shoulder pain (Location of Primary Source of Pain) (Left) 01/29/2016   Colitis 04/08/2016   Colon polyp 01/30/2017   Coronary vasospasm (HCC) 12/09/2014   DDD (degenerative disc disease), cervical    a.) s/p ACDF C5-C7   Depression    DVT (deep venous thrombosis) (HCC)    Esophageal spasm    Essential hypertension 10/28/2013   Gallstones    Gastritis    Gastroenteritis    GERD (gastroesophageal reflux disease)    Heart disease 12/09/2016   High blood pressure    High cholesterol    History of blood transfusion 1985   related to hysterectomy   History of left heart catheterization (LHC) 12/09/2014   a.) LHC 12/09/2014: EF 50%; normal coronaries.   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 Rule out Paget's disease of the bone with blood work.    Hyperlipidemia 10/28/2013   Hypertension    Hypothyroidism    IBS (irritable bowel syndrome)    Long term current use of anticoagulant    a.) apixaban    Lumbar central spinal stenosis (moderate at L4-5; mild at L2-3 and L3-4) 01/29/2016   Migraine    a few times/yr (09/19/2014)   Myocardial infarction (HCC) 10/2010 X 3   while hospitalized   Neck pain 01/29/2016   Osteoarthritis    Peripheral vascular disease (HCC)    DVT/PE. On Eliquis    Pleural effusion, bilateral 07/31/2015   Pneumonia 04/2014   PONV (postoperative nausea and vomiting)    PTSD (post-traumatic stress disorder)    Hx. None currently (2024)   Pulmonary embolism (HCC) 04/2001; 09/19/2014   after gallbladder OR;    Schizoaffective disorder, depressive type (HCC) 08/28/2016   Sleep apnea    mild (09/19/2014)   Snoring    a. sleep study 5/16: No OSA   SOB (shortness of breath) 07/31/2015   Spinal stenosis    Spondylosis    Suicidal ideation 08/27/2016   a.) seen in ED with  approx 6 months of reported SI. Reported ingestion of unknown pills to end life; last ingestion was 3 days PTA; UDS (+) for TCAs. SI related to depression, friend (loss of friend), and social determinants   Type II diabetes mellitus (HCC)    dx'd in 2007; lost weight; no RX for ~ 4 yr now (09/19/2014)   Past Surgical History:  Procedure Laterality Date   ANTERIOR CERVICAL DECOMP/DISCECTOMY FUSION  10/2012   in [place]  C5-C7    APPENDECTOMY  1985   BACK SURGERY     BREAST CYST EXCISION Right    BREAST EXCISIONAL BIOPSY Right YRS AGO   NEG   CARDIAC CATHETERIZATION   2000's; 2009   CHOLECYSTECTOMY  2002   COLONOSCOPY WITH PROPOFOL  N/A 12/12/2016   Procedure: COLONOSCOPY WITH PROPOFOL ;  Surgeon: [name], MD;  Location: [facility] ENDOSCOPY;  Service: Endoscopy;  Laterality: N/A;   COLONOSCOPY WITH PROPOFOL  N/A 02/17/2017    Procedure: COLONOSCOPY WITH PROPOFOL ;  Surgeon: Kung Ruel, MD;  Location: [facility] ENDOSCOPY;  Service: Gastroenterology;  Laterality: N/A;   COLONOSCOPY WITH PROPOFOL  N/A 11/04/2021   Procedure: COLONOSCOPY WITH PROPOFOL ;  Surgeon: Kung Ruel, MD;  Location: [facility] ENDOSCOPY;  Service: Gastroenterology;  Laterality: N/A;   CYSTOSCOPY N/A 05/15/2022   Procedure: CYSTOSCOPY;  Surgeon: Leonce Garnette BIRCH, MD;  Location: [facility] ORS;  Service: Gynecology;  Laterality: N/A;   ESOPHAGOGASTRODUODENOSCOPY N/A 03/02/2024   Procedure: EGD (ESOPHAGOGASTRODUODENOSCOPY);  Surgeon: Kung Ruel, MD;  Location: [facility] ENDOSCOPY;  Service: Gastroenterology;  Laterality: N/A;  DM also on OZEMPIC    ESOPHAGOGASTRODUODENOSCOPY (EGD) WITH PROPOFOL  N/A 02/17/2017   Procedure: ESOPHAGOGASTRODUODENOSCOPY (EGD) WITH PROPOFOL ;  Surgeon: Kung Ruel, MD;  Location: [facility] ENDOSCOPY;  Service: Gastroenterology;  Laterality: N/A;   ESOPHAGOGASTRODUODENOSCOPY (EGD) WITH PROPOFOL  N/A 03/07/2019   Procedure: ESOPHAGOGASTRODUODENOSCOPY (EGD) WITH PROPOFOL ;  Surgeon: Kung Ruel, MD;  Location: [facility] ENDOSCOPY;  Service: Gastroenterology;  Laterality: N/A;   EXCISION/RELEASE BURSA HIP Right 12/1984   HERNIA REPAIR  1985   IVC FILTER REMOVAL     2020s   LEFT HEART CATHETERIZATION WITH CORONARY ANGIOGRAM N/A 12/09/2014   Procedure: LEFT HEART CATHETERIZATION WITH CORONARY ANGIOGRAM;  Surgeon: Lonni BIRCH Cash, MD;  Location: [facility] CATH LAB;  Service: Cardiovascular;  Laterality: N/A;   PARTIAL HYSTERECTOMY  1985   ROBOTIC ASSISTED BILATERAL SALPINGO OOPHERECTOMY Bilateral 05/15/2022   Procedure: XI ROBOTIC ASSISTED BILATERAL OOPHORECTOMY;  Surgeon: Leonce Garnette BIRCH, MD;  Location: [facility] ORS;  Service: Gynecology;  Laterality: Bilateral;   SPINE SURGERY  07/2023   TONSILLECTOMY AND ADENOIDECTOMY  ~ 1968   TOTAL HIP ARTHROPLASTY Left 04/06/2014   UMBILICAL HERNIA REPAIR  1985   VENA CAVA FILTER PLACEMENT  03/2014   Patient Active  Problem List   Diagnosis Date Noted   Vaginal discharge 01/23/2024   Urethral prolapse 01/23/2024   Nocturia 01/23/2024   Incontinence of feces 01/22/2024   History of recurrent UTI (urinary tract infection) 01/22/2024   Decreased glomerular filtration rate (GFR) 01/21/2024   Influenza A 10/12/2023   Spondylolisthesis at L4-L5 level 07/27/2023   Left leg numbness 05/20/2023   Thoracic back pain 03/13/2023   Obesity (BMI 30.0-34.9) 12/10/2022   Subcutaneous nodule 06/09/2022   Numbness of right foot 03/05/2022   OAB (overactive bladder) 03/05/2022   Ingrown fingernail 12/27/2021   Cramps, extremity 11/27/2021   Skin lesion of lower extremity- right knee  09/26/2021   Retention cyst of nasal sinus 08/27/2021   Rash 08/27/2021   Bulimia 01/14/2021   Cervical radiculitis 11/15/2020   Right  arm pain 11/12/2020   Bilateral low back pain without sciatica 06/20/2020   Paronychia of great toe of right foot 04/05/2020   Paronychia of great toe, right 02/27/2020   Foraminal stenosis of cervical region 01/31/2020   Cervical fusion syndrome (C5-C7 2014) 01/31/2020   Ingrown toenail 01/27/2020   Chronic diastolic heart failure (HCC) 01/27/2020   Postmenopausal estrogen deficiency 08/12/2019   Left wrist pain 05/20/2019   Allergic rhinitis 01/27/2019   Pain of right lower extremity 10/06/2018   Mixed stress and urge urinary incontinence 10/27/2017   Elevated alkaline phosphatase level 10/27/2017   Hypothyroidism 08/26/2017   Type 2 diabetes mellitus with other specified complication (HCC) 05/29/2017   Right upper quadrant pain 01/12/2017   Plantar fasciitis of left foot 11/07/2016   CAD (coronary artery disease) 09/28/2016   Epigastric discomfort 09/16/2016   Schizoaffective disorder, depressive type (HCC) 08/28/2016   Cervicalgia 01/29/2016   Chronic low back pain (Location of Secondary source of pain) (Bilateral) (R>L) 01/29/2016   Chronic pain syndrome 01/29/2016   Primary  osteoarthritis of right hip 01/29/2016   Lumbar central spinal stenosis (moderate at L4-5; mild at L2-3 and L3-4) 01/29/2016   Breast pain 10/19/2015   Abdominal pain 10/14/2015   Cough 07/31/2015   Esophageal spasm 06/05/2015   History of DVT (deep vein thrombosis) 02/25/2015   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 Lauraine Buoy    REFERRING PROVIDER: Dr. Arley Helling   REFERRING DIAG: M54.40 (ICD-10-CM) - Lumbago of lumbar region with sciatica   Rationale for Evaluation and Treatment: Rehabilitation  THERAPY DIAG:  Other low back pain  ONSET DATE: 12/01/23  SUBJECTIVE:                                                                                                                                                                                           SUBJECTIVE STATEMENT: Pt recently got imaging back for low back and it shows disc bulge in lumbar spine. She has a follow up with spinal surgeon towards the end of the month.   PERTINENT HISTORY:  Pt reports that had lumbar spinal fusion in November of 2024 and she has since developed soreness and pain in her low back that is similar to what she was feeling before the surgery.  She has also had surgery on her cervical spine in September of last year and she has had a left hip replacement. She has since weaned off Tylenol  because of liver issues, so her pain is not as well controlled and she is seeking further medical treatment and has an appointment scheduled with her physician. She is also concerned about her liver function and she is following up with  physician for  CT scan.   PAIN:  Are you having pain? Yes: NPRS scale: 4/10  Pain location: Central spinous process of low back and wraps around anteriorly towards ASIS  Pain description: Dull pain and fatigue when standing and walking for long periods of time   Aggravating factors: Walking around and laying down  Relieving factors: Sitting Down, but not for a long time.   PRECAUTIONS: None  RED FLAGS: None   WEIGHT BEARING RESTRICTIONS: No  FALLS:  Has patient fallen in last 6 months? No, she had a fall in September of last year when standing up from bed because she could not feel her left foot and she lost her balance.   LIVING ENVIRONMENT: Lives with: lives alone Lives in: House/apartment Stairs: Yes: External: 3 steps; on right going up Has following equipment at home: Single point cane, Walker - 2 wheeled, Wheelchair (manual), shower chair, Tour manager, and Grab bars  OCCUPATION: Not currently working.   PLOF: Independent  PATIENT GOALS: She is limited in doing housework activities because of her low back pain. She is unable to clean up her house as a result.   NEXT MD VISIT: Doctor will schedule apt after her CT scan of low back and liver   OBJECTIVE:  Note: Objective measures were completed at Evaluation unless otherwise noted.  VITALS: BP 116/74HR 72 Sp02 98  DIAGNOSTIC FINDINGS:  CLINICAL DATA:  Chronic low back pain, right leg pain for few years   EXAM: MRI LUMBAR SPINE WITHOUT CONTRAST   TECHNIQUE: Multiplanar, multisequence MR imaging of the lumbar spine was performed. No intravenous contrast was administered.   COMPARISON:  12/25/2021, 06/21/2020   FINDINGS: Segmentation:  Standard.   Alignment:  Minimal grade 1 anterolisthesis of L4 on L5.   Vertebrae: No acute fracture, evidence of discitis, or aggressive bone lesion.   Conus medullaris and cauda equina: Conus extends to the L1-2 level. Conus and cauda equina appear normal.   Paraspinal and  other soft tissues: No acute paraspinal abnormality.   Disc levels:   Disc spaces: Degenerative disease with disc height loss at L4-5.   T12-L1: No significant disc bulge. No neural foraminal stenosis. No central canal stenosis.   L1-L2: Mild broad-based disc bulge. Moderate bilateral facet arthropathy. No foraminal or central canal stenosis.   L2-L3: Minimal broad-based disc bulge. Severe bilateral facet arthropathy. Moderate spinal stenosis. Right lateral recess stenosis. Mild right foraminal stenosis. No left foraminal stenosis.   L3-L4: Mild broad-based disc bulge. Severe bilateral facet arthropathy. Severe spinal stenosis. Mild right foraminal stenosis. No left foraminal stenosis.   L4-L5: Broad-based disc bulge. Severe bilateral facet arthropathy with ligamentum flavum infolding. Moderate-severe spinal stenosis. Moderate right foraminal stenosis. No left foraminal stenosis. Bilateral lateral recess stenosis.   L5-S1: Mild broad-based disc bulge. Severe bilateral facet arthropathy. No foraminal or central canal stenosis.   IMPRESSION: 1. Diffuse lumbar spine spondylosis as described above. 2. No acute osseous injury of the lumbar spine.     Electronically Signed   By: Julaine Blanch M.D.   On: 06/10/2023 09:52  PATIENT SURVEYS:  Modified Oswestry NT    COGNITION: Overall cognitive status: Within functional limits for tasks assessed     SENSATION: Light touch: Impaired  Neuropathy in bilateral feet    MUSCLE LENGTH: Hamstrings: Right 70 deg; Left 70 deg   POSTURE: No Significant postural limitations  PALPATION: L2-L5 CSP   LUMBAR ROM:   AROM eval  Flexion 50%*  Extension 50%*  Right lateral flexion 50%*  Left lateral flexion 50%*  Right rotation 100%  Left rotation 100%   (Blank rows = not tested)  LOWER EXTREMITY ROM:     Active  Right eval Left eval  Hip flexion    Hip extension    Hip abduction    Hip adduction    Hip internal rotation     Hip external rotation    Knee flexion    Knee extension    Ankle dorsiflexion    Ankle plantarflexion    Ankle inversion    Ankle eversion     (Blank rows = not tested)  LOWER EXTREMITY MMT:    MMT Right eval Left eval  Hip flexion 4- 4-   Hip extension 3- 3-  Hip abduction 4- 4  Hip adduction    Hip internal rotation NT  NT   Hip external rotation NT NT   Knee flexion 4 4  Knee extension 4 4  Ankle dorsiflexion 4 4  Ankle plantarflexion    Ankle inversion    Ankle eversion     (Blank rows = not tested)  Abdominal Strength Testing: Sahrmann Testing  Level 4    LUMBAR SPECIAL TESTS:  Straight leg raise test: Negative, SI Compression/distraction test: NT , FABER test: Positive, and FADIR Positive Bilateral   FUNCTIONAL TESTS:  5 times sit to stand: NT  30 seconds chair stand test 10 meter walk test: NT     MODI: 20/50=40%  TREATMENT DATE:   03/07/24: THEREX  Seated Lumbar Flexion AAROM Silver Towel Slide Flexion Center, Right, and Left 3 x 20   Seated Hip ER Stretch 4 x 60 sec   Standing Marches with BUE support 2 x 10   Standing Marches with no UE support against 2 x 10   Standing Heel Raises with no UE support 3 x 15   Standing Hip Abduction with 1 UE support 2 x 10   -NRPS 5-6/10 described as soreness  Seated Rest Break 2  min  Standing Hip Abduction with BUE support 2 x 10   TM test TM with 0 incline at 1 mph for 3 min (5-6/10 NRPS)  TM  with incline at 2 at 1 mph for 2 min  (5-6/1O NRPS)   SELF CARE HOME MANAGEMENT   Explained imaging and what could be causing her symptoms and defined medical terminology.    CLINICAL DATA:  Pain after back surgery in November of 2024. Pain radiates to both hips and the pelvis.   EXAM: CT LUMBAR SPINE WITHOUT CONTRAST   TECHNIQUE: Multidetector CT imaging of the lumbar spine was performed without intravenous contrast administration. Multiplanar CT image reconstructions were also generated.   RADIATION DOSE  REDUCTION: This exam was performed according to the departmental dose-optimization program which includes automated exposure control, adjustment of the mA and/or kV according to patient size and/or use of iterative reconstruction technique.   COMPARISON:  None Available.   FINDINGS: Segmentation: 5 lumbar type vertebrae.   Alignment: Normal.   Vertebrae: L3-5 PLIF.  No acute abnormality.   Paraspinal and other soft  tissues: Calcific aortic atherosclerosis.   Disc levels: The T11-L2 disc levels are unremarkable.   L2-3: Minimal disc bulge with moderate right facet hypertrophy. No spinal canal or neural foraminal stenosis.   L3-4: PLIF without residual spinal canal or neural foraminal stenosis.   L4-5: PLIF without residual spinal canal or neural foraminal stenosis.   L5-S1: Small disc bulge with moderate facet arthrosis. Narrowing of the right lateral recess. No central spinal canal stenosis. No neural foraminal stenosis.   IMPRESSION: 1. L3-5 PLIF without residual spinal canal or neural foraminal stenosis. 2. Small disc bulge with moderate facet arthrosis at L5-S1 narrowing the right lateral recess.   Aortic Atherosclerosis (ICD10-I70.0).     Electronically Signed   By: Franky Stanford M.D.   On: 03/02/2024 11:45     PATIENT EDUCATION:  Education details: Form and technique for correct performance of exercise.  Person educated: Patient Education method: Explanation, Demonstration, Verbal cues, and Handouts Education comprehension: verbalized understanding, returned demonstration, and verbal cues required  HOME EXERCISE PROGRAM: Access Code: [email or web address] URL: [email or web address] Date: 03/03/2024 Prepared by: Toribio Servant  Program Notes Towel slides on counter center, right, left 30 reps every day  Exercises - Bound Angle Hands Forward   - 1 x daily - 7 x weekly - 3 reps - 30-60 sec  hold - Seated Hip External Rotation Stretch  - 1 x daily - 7  x weekly - 3 reps - 30-60 sec  hold - Prone Quad Stretch with Towel Roll and Strap  - 1 x daily - 7 x weekly - 3 reps - 60 sec hold - Supine Lower Trunk Rotation  - 1 x daily - 7 x weekly - 2 sets - 10 reps - 3 sec hold - Seated Hamstring Stretch  - 1 x daily - 7 x weekly - 3 reps - 60 sec hold - Seated Transversus Abdominis Bracing  - 1 x daily - 7 x weekly - 2 sets - 10 reps - 5 sec  hold - Clamshell with Resistance  - 3-4 x weekly - 3 sets - 10 reps - Modified Bird Dog At Wall  - 3-4 x weekly - 2 sets - 10 reps - Mini Squat  - 3-4 x weekly - 3 sets - 10 reps   ASSESSMENT:  CLINICAL IMPRESSION: Despite improvement in activity tolerance, pt's soreness and fatigue in low back has not improved since starting PT. PT discussed plan of care with patient and the need for her to continue home exercise program and to check in with referring provider for further medical eval and treatment. Pt to continue with treatment to finalize home exercise program and reassessing goals before return to referring clinician. She will continue to benefit from skilled PT to address these aforementioned deficits to return to standing and walking for prolonged of time to return to washing dishes and cleaning her home and to decrease risk falls.    OBJECTIVE IMPAIRMENTS: Abnormal gait, decreased activity tolerance, decreased balance, decreased endurance, decreased mobility, difficulty walking, decreased ROM, decreased strength, hypomobility, increased muscle spasms, impaired flexibility, impaired sensation, obesity, and pain.   ACTIVITY LIMITATIONS: carrying, lifting, bending, sitting, standing, squatting, sleeping, stairs, bathing, dressing, hygiene/grooming, and locomotion level  PARTICIPATION LIMITATIONS: meal prep, cleaning, laundry, driving, shopping, and community activity  PERSONAL FACTORS: Age, Fitness, Time since onset of injury/illness/exacerbation, and 3+ comorbidities: HTN, HLD, Depression, Obesity, and T2DM  are also affecting patient's functional outcome.   REHAB POTENTIAL: Fair chronicity of condition and prior interventions including  low back surgery  CLINICAL DECISION MAKING: Evolving/moderate complexity  EVALUATION COMPLEXITY: Moderate   GOALS: Goals reviewed with patient? No  SHORT TERM GOALS: Target date: 03/01/2024  Patient will demonstrate undestanding of home exercise plan by performing exercises correctly with evidence of good carry over with min to no verbal or tactile cues .   Baseline: NT 02/29/24: Performing independently   Goal status: ACHIEVED    2.  Patient will show decreased risk of falling as evidence by decrease in self-selected gait speed by >= 0.12 m/sec.  Baseline: 0.82 m/sec  02/29/24: 0.91 m/sec   Goal status: PARTIALLY MET     LONG TERM GOALS: Target date: 05/11/2024  Patient will improve modified Oswestry Disability Index (MODI) score by >=13 points as evidence of the minimal statistically significant change for improvement with low back pain disability and improvement in low back function (Copay et al, 2008) Baseline: 40% Goal status: ONGOING    2.  Patient will improve hip strength by 1/3 grade (I.E 4- to 4+) and abdominal strength by >= 1 grade for improved lumbar stability and to assist low back in absorbing mechanical forces in weight bearing positions for improved symptom response. Baseline: Hip MMT Flex R/L 4-/4-, Hip Ext R/L 3-/3-, Sahrmann level 4    Goal status: ONGOING     3.  Patient will perform 5 x STS in <12.6 sec to demonstrate improve LE strength to decrease risk of falling (Bohannon, 2006). Baseline: 18 sec  Goal status: ONGOING    4.  Patient will perform in >=1.0 m./sec as evidence of improved mobility, decreased falls risk, and overall functional independence.  Baseline: 0.82 m/sec 02/29/24:  0.91 m/sec   Goal status: ONGOING    5.  Patient will demonstrate improved low back pain by being able to stand for >=5 min in order to  wash dishes and clean home.  Baseline: NT 03/07/24: Feels increased low back fatigue 5-6/10 NRPS   Goal status: ONGOING    6.  Patient will be able to walk for >=10 min without being limited by low back pain to the point where she needs to take a seated or standing rest break as evidence of improved low back function and to return to walking community level distances.  Baseline:  NT  Goal status: ONGOING    PLAN:  PT FREQUENCY: 1-2x/week  PT DURATION: 12 weeks  PLANNED INTERVENTIONS: [id number]- PT Re-evaluation, 97750- Physical Performance Testing, 97110-Therapeutic exercises, 97530- Therapeutic activity, [id number]- Neuromuscular re-education, 97535- Self Care, [id number]- Manual therapy, [id number]- Gait training, [phone]- Orthotic Initial, [id number]- Orthotic/Prosthetic subsequent, [phone]- Canalith repositioning, [id number]- Aquatic Therapy, H9716- Electrical stimulation (unattended), [phone]- Electrical stimulation (manual), [id number]- Ultrasound, [id number]- Traction (mechanical), [id number]- Wound care (first 20 sq cm), 97598- Wound care (each additional 20 sq cm), 20560 (1-2 muscles), 20561 (3+ muscles)- Dry Needling, Patient/Family education, Balance training, Stair training, Joint mobilization, Joint manipulation, Spinal manipulation, Spinal mobilization, Vestibular training, DME instructions, Cryotherapy, and Moist heat.  PLAN FOR NEXT SESSION:  Reassess goals. Walking with rollator.  Cat Cow. Open Books for thoracic mobility.    Toribio Servant PT, DPT  [facility] [address] [place], [address] Phone: [phone]   Fax:  [phone]

## 2024-03-08 ENCOUNTER — Ambulatory Visit: Payer: Self-pay | Admitting: Gastroenterology

## 2024-03-08 NOTE — Progress Notes (Signed)
 Inform bx showed features of reflux related inflammation - if has symptoms ofheartburn or discomfort to let us  know to discuss rx at follow up visit

## 2024-03-09 ENCOUNTER — Ambulatory Visit: Admitting: Physical Therapy

## 2024-03-09 DIAGNOSIS — M5459 Other low back pain: Secondary | ICD-10-CM | POA: Diagnosis not present

## 2024-03-09 NOTE — Therapy (Signed)
 OUTPATIENT PHYSICAL THERAPY THORACOLUMBAR TREATMENT    Patient Name: Valerie Vazquez MRN: [id number] DOB:1955/12/31, 68 y.o., female Today's Date: 03/09/2024  END OF SESSION:   PT End of Session - 03/09/24 1357     Visit Number 8    Number of Visits 24    Date for PT Re-Evaluation 05/10/24    Authorization Type [employer] Medicare 2025    Authorization - Visit Number 8    Authorization - Number of Visits 24    Progress Note Due on Visit 10    PT Start Time 1350    PT Stop Time 1430    PT Time Calculation (min) 40 min    Activity Tolerance Patient limited by pain    Behavior During Therapy [facility] for tasks assessed/performed           Past Medical History:  Diagnosis Date   Acute right-sided low back pain with right-sided sciatica 10/23/2017   Anemia    Anginal pain (HCC)    Bilateral ovarian cysts    Bilateral renal cysts    Bulimia nervosa 05/07/2021   CAD (coronary artery disease) 09/28/2016   Chest pain 10/14/2015   CHF (congestive heart failure) (HCC)    Diastolic   Chronic back pain    upper and lower (09/19/2014)   Chronic shoulder pain (Location of Primary Source of Pain) (Left) 01/29/2016   Colitis 04/08/2016   Colon polyp 01/30/2017   Coronary vasospasm (HCC) 12/09/2014   DDD (degenerative disc disease), cervical    a.) s/p ACDF C5-C7   Depression    DVT (deep venous thrombosis) (HCC)    Esophageal spasm    Essential hypertension 10/28/2013   Gallstones    Gastritis    Gastroenteritis    GERD (gastroesophageal reflux disease)    Heart disease 12/09/2016   High blood pressure    High cholesterol    History of blood transfusion 1985   related to hysterectomy   History of left heart catheterization (LHC) 12/09/2014   a.) LHC 12/09/2014: EF 50%; normal coronaries.   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 Rule out Paget's disease of the bone with blood work.    Hyperlipidemia 10/28/2013   Hypertension    Hypothyroidism    IBS (irritable bowel syndrome)    Long term current use of anticoagulant    a.) apixaban    Lumbar central spinal stenosis (moderate at L4-5; mild at L2-3 and L3-4) 01/29/2016   Migraine    a few times/yr (09/19/2014)   Myocardial infarction (HCC) 10/2010 X 3   while hospitalized   Neck pain 01/29/2016   Osteoarthritis    Peripheral vascular disease (HCC)    DVT/PE. On Eliquis    Pleural effusion, bilateral 07/31/2015   Pneumonia 04/2014   PONV (postoperative nausea and vomiting)    PTSD (post-traumatic stress disorder)    Hx. None currently (2024)   Pulmonary embolism (HCC) 04/2001; 09/19/2014   after gallbladder OR;    Schizoaffective disorder, depressive type (HCC) 08/28/2016   Sleep apnea    mild (09/19/2014)   Snoring    a. sleep study 5/16: No OSA   SOB (shortness of breath) 07/31/2015   Spinal stenosis    Spondylosis    Suicidal ideation 08/27/2016   a.) seen in ED with  approx 6 months of reported SI. Reported ingestion of unknown pills to end life; last ingestion was 3 days PTA; UDS (+) for TCAs. SI related to depression, friend (loss of friend), and social determinants   Type II diabetes mellitus (HCC)    dx'd in 2007; lost weight; no RX for ~ 4 yr now (09/19/2014)   Past Surgical History:  Procedure Laterality Date   ANTERIOR CERVICAL DECOMP/DISCECTOMY FUSION  10/2012   in [place]  C5-C7    APPENDECTOMY  1985   BACK SURGERY     BREAST CYST EXCISION Right    BREAST EXCISIONAL BIOPSY Right YRS AGO   NEG   CARDIAC CATHETERIZATION   2000's; 2009   CHOLECYSTECTOMY  2002   COLONOSCOPY WITH PROPOFOL  N/A 12/12/2016   Procedure: COLONOSCOPY WITH PROPOFOL ;  Surgeon: [name], MD;  Location: [facility] ENDOSCOPY;  Service: Endoscopy;  Laterality: N/A;   COLONOSCOPY WITH PROPOFOL  N/A 02/17/2017    Procedure: COLONOSCOPY WITH PROPOFOL ;  Surgeon: Kung Ruel, MD;  Location: [facility] ENDOSCOPY;  Service: Gastroenterology;  Laterality: N/A;   COLONOSCOPY WITH PROPOFOL  N/A 11/04/2021   Procedure: COLONOSCOPY WITH PROPOFOL ;  Surgeon: Kung Ruel, MD;  Location: [facility] ENDOSCOPY;  Service: Gastroenterology;  Laterality: N/A;   CYSTOSCOPY N/A 05/15/2022   Procedure: CYSTOSCOPY;  Surgeon: Leonce Garnette BIRCH, MD;  Location: [facility] ORS;  Service: Gynecology;  Laterality: N/A;   ESOPHAGOGASTRODUODENOSCOPY N/A 03/02/2024   Procedure: EGD (ESOPHAGOGASTRODUODENOSCOPY);  Surgeon: Kung Ruel, MD;  Location: [facility] ENDOSCOPY;  Service: Gastroenterology;  Laterality: N/A;  DM also on OZEMPIC    ESOPHAGOGASTRODUODENOSCOPY (EGD) WITH PROPOFOL  N/A 02/17/2017   Procedure: ESOPHAGOGASTRODUODENOSCOPY (EGD) WITH PROPOFOL ;  Surgeon: Kung Ruel, MD;  Location: [facility] ENDOSCOPY;  Service: Gastroenterology;  Laterality: N/A;   ESOPHAGOGASTRODUODENOSCOPY (EGD) WITH PROPOFOL  N/A 03/07/2019   Procedure: ESOPHAGOGASTRODUODENOSCOPY (EGD) WITH PROPOFOL ;  Surgeon: Kung Ruel, MD;  Location: [facility] ENDOSCOPY;  Service: Gastroenterology;  Laterality: N/A;   EXCISION/RELEASE BURSA HIP Right 12/1984   HERNIA REPAIR  1985   IVC FILTER REMOVAL     2020s   LEFT HEART CATHETERIZATION WITH CORONARY ANGIOGRAM N/A 12/09/2014   Procedure: LEFT HEART CATHETERIZATION WITH CORONARY ANGIOGRAM;  Surgeon: Lonni BIRCH Cash, MD;  Location: [facility] CATH LAB;  Service: Cardiovascular;  Laterality: N/A;   PARTIAL HYSTERECTOMY  1985   ROBOTIC ASSISTED BILATERAL SALPINGO OOPHERECTOMY Bilateral 05/15/2022   Procedure: XI ROBOTIC ASSISTED BILATERAL OOPHORECTOMY;  Surgeon: Leonce Garnette BIRCH, MD;  Location: [facility] ORS;  Service: Gynecology;  Laterality: Bilateral;   SPINE SURGERY  07/2023   TONSILLECTOMY AND ADENOIDECTOMY  ~ 1968   TOTAL HIP ARTHROPLASTY Left 04/06/2014   UMBILICAL HERNIA REPAIR  1985   VENA CAVA FILTER PLACEMENT  03/2014   Patient Active  Problem List   Diagnosis Date Noted   Vaginal discharge 01/23/2024   Urethral prolapse 01/23/2024   Nocturia 01/23/2024   Incontinence of feces 01/22/2024   History of recurrent UTI (urinary tract infection) 01/22/2024   Decreased glomerular filtration rate (GFR) 01/21/2024   Influenza A 10/12/2023   Spondylolisthesis at L4-L5 level 07/27/2023   Left leg numbness 05/20/2023   Thoracic back pain 03/13/2023   Obesity (BMI 30.0-34.9) 12/10/2022   Subcutaneous nodule 06/09/2022   Numbness of right foot 03/05/2022   OAB (overactive bladder) 03/05/2022   Ingrown fingernail 12/27/2021   Cramps, extremity 11/27/2021   Skin lesion of lower extremity- right knee  09/26/2021   Retention cyst of nasal sinus 08/27/2021   Rash 08/27/2021   Bulimia 01/14/2021   Cervical radiculitis 11/15/2020   Right  arm pain 11/12/2020   Bilateral low back pain without sciatica 06/20/2020   Paronychia of great toe of right foot 04/05/2020   Paronychia of great toe, right 02/27/2020   Foraminal stenosis of cervical region 01/31/2020   Cervical fusion syndrome (C5-C7 2014) 01/31/2020   Ingrown toenail 01/27/2020   Chronic diastolic heart failure (HCC) 01/27/2020   Postmenopausal estrogen deficiency 08/12/2019   Left wrist pain 05/20/2019   Allergic rhinitis 01/27/2019   Pain of right lower extremity 10/06/2018   Mixed stress and urge urinary incontinence 10/27/2017   Elevated alkaline phosphatase level 10/27/2017   Hypothyroidism 08/26/2017   Type 2 diabetes mellitus with other specified complication (HCC) 05/29/2017   Right upper quadrant pain 01/12/2017   Plantar fasciitis of left foot 11/07/2016   CAD (coronary artery disease) 09/28/2016   Epigastric discomfort 09/16/2016   Schizoaffective disorder, depressive type (HCC) 08/28/2016   Cervicalgia 01/29/2016   Chronic low back pain (Location of Secondary source of pain) (Bilateral) (R>L) 01/29/2016   Chronic pain syndrome 01/29/2016   Primary  osteoarthritis of right hip 01/29/2016   Lumbar central spinal stenosis (moderate at L4-5; mild at L2-3 and L3-4) 01/29/2016   Breast pain 10/19/2015   Abdominal pain 10/14/2015   Cough 07/31/2015   Esophageal spasm 06/05/2015   History of DVT (deep vein thrombosis) 02/25/2015   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 Lauraine Buoy    REFERRING PROVIDER: Dr. Arley Helling   REFERRING DIAG: M54.40 (ICD-10-CM) - Lumbago of lumbar region with sciatica   Rationale for Evaluation and Treatment: Rehabilitation  THERAPY DIAG:  Other low back pain  ONSET DATE: 12/01/23  SUBJECTIVE:                                                                                                                                                                                           SUBJECTIVE STATEMENT: Pt reports increased mid back pain that is new and different than her low back pain that just started happening earlier today.   PERTINENT HISTORY:  Pt reports that had lumbar spinal fusion in November of 2024 and she has since developed soreness and pain in her low back that is similar to what she was feeling before the surgery. She has also had surgery on her cervical  spine in September of last year and she has had a left hip replacement. She has since weaned off Tylenol  because of liver issues, so her pain is not as well controlled and she is seeking further medical treatment and has an appointment scheduled with her physician. She is also concerned about her liver function and she is following up with physician for  CT scan.    PAIN:  Are you having pain? Yes: NPRS scale: 4/10  Pain location: Central spinous process of low back and wraps around anteriorly towards ASIS  Pain description: Dull pain and fatigue when standing and walking for long periods of time   Aggravating factors: Walking around and laying down  Relieving factors: Sitting Down, but not for a long time.   PRECAUTIONS: None  RED FLAGS: None   WEIGHT BEARING RESTRICTIONS: No  FALLS:  Has patient fallen in last 6 months? No, she had a fall in September of last year when standing up from bed because she could not feel her left foot and she lost her balance.   LIVING ENVIRONMENT: Lives with: lives alone Lives in: House/apartment Stairs: Yes: External: 3 steps; on right going up Has following equipment at home: Single point cane, Walker - 2 wheeled, Wheelchair (manual), shower chair, Tour manager, and Grab bars  OCCUPATION: Not currently working.   PLOF: Independent  PATIENT GOALS: She is limited in doing housework activities because of her low back pain. She is unable to clean up her house as a result.   NEXT MD VISIT: Doctor will schedule apt after her CT scan of low back and liver   OBJECTIVE:  Note: Objective measures were completed at Evaluation unless otherwise noted.  VITALS: BP 116/74HR 72 Sp02 98  DIAGNOSTIC FINDINGS:  CLINICAL DATA:  Chronic low back pain, right leg pain for few years   EXAM: MRI LUMBAR SPINE WITHOUT CONTRAST   TECHNIQUE: Multiplanar, multisequence MR imaging of the lumbar spine was performed. No intravenous contrast was administered.   COMPARISON:  12/25/2021, 06/21/2020   FINDINGS: Segmentation:  Standard.   Alignment:  Minimal grade 1 anterolisthesis of L4 on L5.   Vertebrae: No acute fracture, evidence of discitis, or aggressive bone lesion.   Conus medullaris and cauda equina: Conus extends to the L1-2 level. Conus and cauda equina appear normal.   Paraspinal and other soft tissues: No  acute paraspinal abnormality.   Disc levels:   Disc spaces: Degenerative disease with disc height loss at L4-5.   T12-L1: No significant disc bulge. No neural foraminal stenosis. No central canal stenosis.   L1-L2: Mild broad-based disc bulge. Moderate bilateral facet arthropathy. No foraminal or central canal stenosis.   L2-L3: Minimal broad-based disc bulge. Severe bilateral facet arthropathy. Moderate spinal stenosis. Right lateral recess stenosis. Mild right foraminal stenosis. No left foraminal stenosis.   L3-L4: Mild broad-based disc bulge. Severe bilateral facet arthropathy. Severe spinal stenosis. Mild right foraminal stenosis. No left foraminal stenosis.   L4-L5: Broad-based disc bulge. Severe bilateral facet arthropathy with ligamentum flavum infolding. Moderate-severe spinal stenosis. Moderate right foraminal stenosis. No left foraminal stenosis. Bilateral lateral recess stenosis.   L5-S1: Mild broad-based disc bulge. Severe bilateral facet arthropathy. No foraminal or central canal stenosis.   IMPRESSION: 1. Diffuse lumbar spine spondylosis as described above. 2. No acute osseous injury of the lumbar spine.     Electronically Signed   By: Julaine Blanch M.D.   On: 06/10/2023 09:52  PATIENT SURVEYS:  Modified Oswestry NT    COGNITION:  Overall cognitive status: Within functional limits for tasks assessed     SENSATION: Light touch: Impaired  Neuropathy in bilateral feet    MUSCLE LENGTH: Hamstrings: Right 70 deg; Left 70 deg   POSTURE: No Significant postural limitations  PALPATION: L2-L5 CSP   LUMBAR ROM:   AROM eval  Flexion 50%*  Extension 50%*  Right lateral flexion 50%*  Left lateral flexion 50%*  Right rotation 100%  Left rotation 100%   (Blank rows = not tested)  LOWER EXTREMITY ROM:     Active  Right eval Left eval  Hip flexion    Hip extension    Hip abduction    Hip adduction    Hip internal rotation    Hip external  rotation    Knee flexion    Knee extension    Ankle dorsiflexion    Ankle plantarflexion    Ankle inversion    Ankle eversion     (Blank rows = not tested)  LOWER EXTREMITY MMT:    MMT Right eval Left eval Right  03/09/24 Left  03/09/24  Hip flexion 4- 4-  4 4  Hip extension 3- 3- 4- 4-  Hip abduction 4- 4 4- 4  Hip adduction      Hip internal rotation NT  NT     Hip external rotation NT NT     Knee flexion 4 4 4+ 4+  Knee extension 4 4 4+ 4+  Ankle dorsiflexion 4 4 4+ 4+  Ankle plantarflexion      Ankle inversion      Ankle eversion       (Blank rows = not tested)  Abdominal Strength Testing: Sahrmann Testing  Level 4    LUMBAR SPECIAL TESTS:  Straight leg raise test: Negative, SI Compression/distraction test: NT , FABER test: Positive, and FADIR Positive Bilateral   FUNCTIONAL TESTS:  5 times sit to stand: 13 sec  10 meter walk test: 10/10.21 sec = 0.98 m/sec     MODI: 20/50=40%  TREATMENT DATE:   03/09/24: THEREX  Seated Lumbar Flexion AAROM Towel Slide Flexion Center, Right, and Left 3 x 30  MODI: 24/50 (48%) Hip MMT  -Flex 4/4  -Abd 4-/4  -Ext 4-/4-  Knee MMT  -Flex 4+/4+ -Ext 4+/4+  Ankle DF 4+/4+  10 Meter Walk Test   Gait Speed:  - Normal Gait speed Avg= 10/10.21= 0.98  m/sec    >=1 m/sec [employer] and cross street safely and normal WS <1 m/sec Need Intervention to reduce falls risk  0.12 m/sec improvement represents a statistically significant improvement in gait speed    - 0.8 - 1.3 m/sec - community ambulator - associated with increased independence in self-care  5xSTS: 13 sec  Reviewed home exercise plan: Eliminated lower trunk rotations  Modified Bird Dog 2 x 10  -Pt reports increased low back pain  Mini-Squats to 90 deg hip flexion 2 x 10 -Pt reports no increase in her low back pain    PATIENT EDUCATION:  Education details: Form and technique for correct performance of exercise.  Person educated: Patient Education  method: Explanation, Demonstration, Verbal cues, and Handouts Education comprehension: verbalized understanding, returned demonstration, and verbal cues required  HOME EXERCISE PROGRAM: Access Code: [email or web address] URL: [email or web address] Date: 03/09/2024 Prepared by: Toribio Servant  Program Notes Towel slides on counter center, right, left 30 reps every day  Exercises - Bound Angle Hands Forward   - 1 x daily - 7 x weekly - 3 reps - 30-60  sec  hold - Seated Hip External Rotation Stretch  - 1 x daily - 7 x weekly - 3 reps - 30-60 sec  hold - Sidelying Quadriceps Stretch with Strap  - 1 x daily - 7 x weekly - 3 reps - 30-60 sec  hold - Seated Hamstring Stretch  - 1 x daily - 7 x weekly - 3 reps - 60 sec hold - Seated Transversus Abdominis Bracing  - 1 x daily - 7 x weekly - 2 sets - 10 reps - 5 sec  hold - Mini Squat  - 3-4 x weekly - 3 sets - 10 reps - Standing Hip Abduction with Counter Support  - 3-4 x weekly - 2 sets - 10 reps   ASSESSMENT:  CLINICAL IMPRESSION: Pt has now met most of her rehab goals with improvement in gait speed, LE strength, and hip strength. Despite these strength gains and mobility improvements, pt continues to experience same level of low back pain that is making it more difficult for her to complete her activities of daily living as evidenced by an increase in her perception of disability on MODI survey. PT recommends that pt return to referring provider for further medical eval and treatment and to determine whether PT is still warranted. She will report back about whether she is to continue to therapy after seeing physician next week. She will continue to benefit from skilled PT to address these aforementioned deficits to return to standing and walking for prolonged of time to return to washing dishes and cleaning her home and to decrease risk falls.    OBJECTIVE IMPAIRMENTS: Abnormal gait, decreased activity tolerance, decreased balance, decreased  endurance, decreased mobility, difficulty walking, decreased ROM, decreased strength, hypomobility, increased muscle spasms, impaired flexibility, impaired sensation, obesity, and pain.   ACTIVITY LIMITATIONS: carrying, lifting, bending, sitting, standing, squatting, sleeping, stairs, bathing, dressing, hygiene/grooming, and locomotion level  PARTICIPATION LIMITATIONS: meal prep, cleaning, laundry, driving, shopping, and community activity  PERSONAL FACTORS: Age, Fitness, Time since onset of injury/illness/exacerbation, and 3+ comorbidities: HTN, HLD, Depression, Obesity, and T2DM are also affecting patient's functional outcome.   REHAB POTENTIAL: Fair chronicity of condition and prior interventions including low back surgery  CLINICAL DECISION MAKING: Evolving/moderate complexity  EVALUATION COMPLEXITY: Moderate   GOALS: Goals reviewed with patient? No  SHORT TERM GOALS: Target date: 03/01/2024  Patient will demonstrate undestanding of home exercise plan by performing exercises correctly with evidence of good carry over with min to no verbal or tactile cues .   Baseline: NT 02/29/24: Performing independently   Goal status: ACHIEVED    2.  Patient will show decreased risk of falling as evidence by decrease in self-selected gait speed by >= 0.12 m/sec.  Baseline: 0.82 m/sec  02/29/24: 0.91 m/sec  03/09/24: 0.96 m/sec Goal status: ACHIEVED    LONG TERM GOALS: Target date: 05/11/2024  Patient will improve modified Oswestry Disability Index (MODI) score by >=13 points as evidence of the minimal statistically significant change for improvement with low back pain disability and improvement in low back function (Copay et al, 2008) Baseline: 40%  03/09/24: 24/50 (48%) Goal status: ONGOING    2.  Patient will improve hip strength by 1/3 grade (I.E 4- to 4+) and abdominal strength by >= 1 grade for improved lumbar stability and to assist low back in absorbing mechanical forces in weight bearing  positions for improved symptom response. Baseline: Hip MMT Flex R/L 4-/4-, Hip Ext R/L 3-/3-, Sahrmann level 4  03/09/24: Hip Flex R/L  4/4, Hip Ext 4-/4-, Hip Abd 4-/4 Goal status: PARTIALLY MET   3.  Patient will perform 5 x STS in <12.6 sec to demonstrate improve LE strength to decrease risk of falling (Bohannon, 2006). Baseline: 18 sec  03/09/24: 13 sec   Goal status: PARTIALLY MET     4.  Patient will perform in >=1.0 m./sec as evidence of improved mobility, decreased falls risk, and overall functional independence.  Baseline: 0.82 m/sec 02/29/24:  0.91 m/sec  03/09/24: 0.96 m/sec Goal status: PARTIALLY MET    5.  Patient will demonstrate improved low back pain by being able to stand for >=5 min in order to wash dishes and clean home.  Baseline: NT 03/07/24: Feels increased low back fatigue 5-6/10 NRPS   Goal status: ONGOING    6.  Patient will be able to walk for >=10 min without being limited by low back pain to the point where she needs to take a seated or standing rest break as evidence of improved low back function and to return to walking community level distances.  Baseline:  NT  Goal status: ONGOING    PLAN:  PT FREQUENCY: 1-2x/week  PT DURATION: 12 weeks  PLANNED INTERVENTIONS: [id number]- PT Re-evaluation, 97750- Physical Performance Testing, 97110-Therapeutic exercises, 97530- Therapeutic activity, [id number]- Neuromuscular re-education, 97535- Self Care, [id number]- Manual therapy, [id number]- Gait training, [phone]- Orthotic Initial, [phone]- Orthotic/Prosthetic subsequent, [phone]- Canalith repositioning, [id number]- Aquatic Therapy, [id number]- Electrical stimulation (unattended), [phone]- Electrical stimulation (manual), [id number]- Ultrasound, [id number]- Traction (mechanical), [id number]- Wound care (first 20 sq cm), 97598- Wound care (each additional 20 sq cm), 20560 (1-2 muscles), 20561 (3+ muscles)- Dry Needling, Patient/Family education, Balance training, Stair training, Joint mobilization, Joint manipulation, Spinal  manipulation, Spinal mobilization, Vestibular training, DME instructions, Cryotherapy, and Moist heat.  PLAN FOR NEXT SESSION: Walk on TM for 10 min. Sahrmann re-test for abdominal strength. Cat Cow. Open Books for thoracic mobility. Progress hip strengthening and abdominal exercises: banded squats, Palloff Press    Toribio Servant PT, DPT  [facility] [address], [place], [address] Phone: [phone]   Fax:  [phone]

## 2024-03-14 ENCOUNTER — Encounter: Attending: Family Medicine | Admitting: Dietician

## 2024-03-14 ENCOUNTER — Encounter: Payer: Self-pay | Admitting: Dietician

## 2024-03-14 VITALS — Ht 66.0 in | Wt 185.4 lb

## 2024-03-14 DIAGNOSIS — Z713 Dietary counseling and surveillance: Secondary | ICD-10-CM | POA: Diagnosis not present

## 2024-03-14 DIAGNOSIS — E1169 Type 2 diabetes mellitus with other specified complication: Secondary | ICD-10-CM | POA: Insufficient documentation

## 2024-03-14 NOTE — Patient Instructions (Signed)
 Check blood sugars each morning before eating, and again about 2 hours after dinner in evening. Record results on sheet and bring the sheet back to each visit. Eat a small meal or snack every 3-5 hours during the day. Work on eating at least 1-2 hours after waking up. Can be a low sugar breakfast drink if not hungry.  Keep up with Physical therapy exercises most days of the week.

## 2024-03-14 NOTE — Progress Notes (Signed)
 Diabetes Self-Management Education  Visit Type: First/Initial  Appt. Start Time: 1435 Appt. End Time: 1610  03/14/2024  Ms. Valerie Vazquez, identified by name and date of birth, is a 68 y.o. female with a diagnosis of Diabetes: Type 2.   ASSESSMENT  Height 5' 6 (1.676 m), weight 185 lb 6.4 oz (84.1 kg). Body mass index is 29.92 kg/m.   Diabetes Self-Management Education - 03/14/24 1502       Visit Information   Visit Type First/Initial      Initial Visit   Diabetes Type Type 2    Date Diagnosed 07/2006    Are you currently following a meal plan? No    Are you taking your medications as prescribed? Yes      Health Coping   How would you rate your overall health? Fair      Psychosocial Assessment   Patient Belief/Attitude about Diabetes Motivated to manage diabetes    What is the hardest part about your diabetes right now, causing you the most concern, or is the most worrisome to you about your diabetes?   Making healty food and beverage choices    Learning Readiness Ready    How often do you need to have someone help you when you read instructions, pamphlets, or other written materials from your doctor or pharmacy? 1 - Never    What is the last grade level you completed in school? some college      Pre-Education Assessment   Patient understands the diabetes disease and treatment process. Needs Instruction    Patient understands incorporating nutritional management into lifestyle. Needs Review    Patient undertands incorporating physical activity into lifestyle. Needs Review    Patient understands using medications safely. Comprehends key points    Patient understands monitoring blood glucose, interpreting and using results Comprehends key points    Patient understands prevention, detection, and treatment of acute complications. Needs Instruction    Patient understands prevention, detection, and treatment of chronic complications. Needs Instruction    Patient understands  how to develop strategies to address psychosocial issues. Needs Review    Patient understands how to develop strategies to promote health/change behavior. Needs Review      Complications   Last HgB A1C per patient/outside source 6.2 %    How often do you check your blood sugar? 1-2 times/day    Fasting Blood glucose range (mg/dL) 29-870   recently 19d per patient   Number of hypoglycemic episodes per month 1   did not check sugar (away from home)   Have you had a dilated eye exam in the past 12 months? No   planning to schedule   Have you had a dental exam in the past 12 months? Yes    Are you checking your feet? Yes    How many days per week are you checking your feet? 7      Dietary Intake   Breakfast usually skips    Snack (morning) depends on availabilty    Lunch sometimes skips; fruit ie pineappple fresh/banana/ sandwich/ 7/14 chicken pot pie    Snack (afternoon) none recently    Dinner frozen meal; beanie weinie (small can)    Snack (evening) none recently    Beverage(s) 10 water, juice, milk      Activity / Exercise   Activity / Exercise Type ADL's   PT   How many days per week do you exercise? 2    How many minutes per day do you exercise?  30    Total minutes per week of exercise 60      Patient Education   Disease Pathophysiology Definition of diabetes, type 1 and 2, and the diagnosis of diabetes    Healthy Eating Role of diet in the treatment of diabetes and the relationship between the three main macronutrients and blood glucose level;Food label reading, portion sizes and measuring food.;Plate Method;Carbohydrate counting    Being Active Role of exercise on diabetes management, blood pressure control and cardiac health.    Monitoring Taught/discussed recording of test results and interpretation of SMBG.;Identified appropriate SMBG and/or A1C goals.    Acute complications Taught prevention, symptoms, and  treatment of hypoglycemia - the 15 rule.    Chronic complications  Relationship between chronic complications and blood glucose control    Diabetes Stress and Support Role of stress on diabetes      Individualized Goals (developed by patient)   Nutrition Other (comment)   eat a light meal or snack every 3-5 hours during the day   Physical Activity Exercise 3-5 times per week   PT   Monitoring  Test my blood glucose as discussed      Outcomes   Expected Outcomes Demonstrated interest in learning. Expect positive outcomes    Future DMSE 4-6 wks          Individualized Plan for Diabetes Self-Management Training:   Learning Objective:  Patient will have a greater understanding of diabetes self-management. Patient education plan is to attend individual and/or group sessions per assessed needs and concerns.   Plan:   Patient Instructions  Check blood sugars each morning before eating, and again about 2 hours after dinner in evening. Record results on sheet and bring the sheet back to each visit. Eat a small meal or snack every 3-5 hours during the day. Work on eating at least 1-2 hours after waking up. Can be a low sugar breakfast drink if not hungry.  Keep up with Physical therapy exercises most days of the week.  Expected Outcomes:  Demonstrated interest in learning. Expect positive outcomes  Education material provided: ADA - How to Thrive: A Guide for Your Journey with Diabetes; Plate planner with food lists; sample menus; blood sugar record  If problems or questions, patient to contact team via:  Phone  Future DSME appointment: 4-6 wks 04/25/24

## 2024-03-15 ENCOUNTER — Ambulatory Visit: Admitting: Physical Therapy

## 2024-03-17 ENCOUNTER — Encounter: Admitting: Physical Therapy

## 2024-03-17 DIAGNOSIS — M5440 Lumbago with sciatica, unspecified side: Secondary | ICD-10-CM | POA: Diagnosis not present

## 2024-03-20 ENCOUNTER — Emergency Department

## 2024-03-20 ENCOUNTER — Other Ambulatory Visit: Payer: Self-pay

## 2024-03-20 ENCOUNTER — Emergency Department
Admission: EM | Admit: 2024-03-20 | Discharge: 2024-03-20 | Disposition: A | Discharge status: Home / Self Care | Attending: Emergency Medicine | Admitting: Emergency Medicine

## 2024-03-20 ENCOUNTER — Encounter: Payer: Self-pay | Admitting: Intensive Care

## 2024-03-20 DIAGNOSIS — R55 Syncope and collapse: Secondary | ICD-10-CM | POA: Diagnosis not present

## 2024-03-20 DIAGNOSIS — H538 Other visual disturbances: Secondary | ICD-10-CM | POA: Diagnosis not present

## 2024-03-20 DIAGNOSIS — R0789 Other chest pain: Secondary | ICD-10-CM | POA: Insufficient documentation

## 2024-03-20 DIAGNOSIS — I5030 Unspecified diastolic (congestive) heart failure: Secondary | ICD-10-CM | POA: Insufficient documentation

## 2024-03-20 DIAGNOSIS — E119 Type 2 diabetes mellitus without complications: Secondary | ICD-10-CM | POA: Insufficient documentation

## 2024-03-20 DIAGNOSIS — R42 Dizziness and giddiness: Secondary | ICD-10-CM | POA: Insufficient documentation

## 2024-03-20 DIAGNOSIS — R079 Chest pain, unspecified: Secondary | ICD-10-CM | POA: Diagnosis not present

## 2024-03-20 DIAGNOSIS — Z041 Encounter for examination and observation following transport accident: Secondary | ICD-10-CM | POA: Diagnosis not present

## 2024-03-20 DIAGNOSIS — I110 Hypertensive heart disease with heart failure: Secondary | ICD-10-CM | POA: Diagnosis not present

## 2024-03-20 LAB — COMPREHENSIVE METABOLIC PANEL WITH GFR
ALT: 36 U/L (ref 0–44)
AST: 38 U/L (ref 15–41)
Albumin: 4 g/dL (ref 3.5–5.0)
Alkaline Phosphatase: 82 U/L (ref 38–126)
Anion gap: 10 (ref 5–15)
BUN: 8 mg/dL (ref 8–23)
CO2: 25 mmol/L (ref 22–32)
Calcium: 9.5 mg/dL (ref 8.9–10.3)
Chloride: 107 mmol/L (ref 98–111)
Creatinine, Ser: 1.27 mg/dL — ABNORMAL HIGH (ref 0.44–1.00)
GFR, Estimated: 46 mL/min — ABNORMAL LOW (ref >60)
Glucose, Bld: 113 mg/dL — ABNORMAL HIGH (ref 70–99)
Potassium: 3.6 mmol/L (ref 3.5–5.1)
Sodium: 142 mmol/L (ref 135–145)
Total Bilirubin: 0.7 mg/dL (ref 0.0–1.2)
Total Protein: 7.5 g/dL (ref 6.5–8.1)

## 2024-03-20 LAB — URINALYSIS, ROUTINE W REFLEX MICROSCOPIC
Bilirubin Urine: NEGATIVE
Glucose, UA: >=500 mg/dL — AB
Hgb urine dipstick: NEGATIVE
Ketones, ur: 5 mg/dL — AB
Nitrite: NEGATIVE
Protein, ur: NEGATIVE mg/dL
Specific Gravity, Urine: 1.024 (ref 1.005–1.030)
pH: 6 (ref 5.0–8.0)

## 2024-03-20 LAB — CBC
HCT: 38.5 % (ref 36.0–46.0)
Hemoglobin: 12.7 g/dL (ref 12.0–15.0)
MCH: 31 pg (ref 26.0–34.0)
MCHC: 33 g/dL (ref 30.0–36.0)
MCV: 93.9 fL (ref 80.0–100.0)
Platelets: 270 K/uL (ref 150–400)
RBC: 4.1 MIL/uL (ref 3.87–5.11)
RDW: 13.5 % (ref 11.5–15.5)
WBC: 6.2 K/uL (ref 4.0–10.5)
nRBC: 0 % (ref 0.0–0.2)

## 2024-03-20 LAB — TROPONIN I (HIGH SENSITIVITY): Troponin I (High Sensitivity): 5 ng/L (ref <18)

## 2024-03-20 LAB — PROTIME-INR
INR: 1.5 — ABNORMAL HIGH (ref 0.8–1.2)
Prothrombin Time: 19.1 s — ABNORMAL HIGH (ref 11.4–15.2)

## 2024-03-20 LAB — D-DIMER, QUANTITATIVE: D-Dimer, Quant: 0.32 ug{FEU}/mL (ref 0.00–0.50)

## 2024-03-20 MED ORDER — LACTATED RINGERS IV BOLUS
1000.0000 mL | Freq: Once | INTRAVENOUS | Status: AC
  Administered 2024-03-20: 1000 mL via INTRAVENOUS
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
## 2024-03-20 NOTE — ED Notes (Signed)
 Patient ambulated around the room on room air using her personal walker. Patient denies shortness of breath but endorses still feeling dizzy. Oxygen  saturation remained at 98% while ambulating. Patient also ambulated to the bathroom for a urine sample. Patient is now resting in bed.

## 2024-03-20 NOTE — ED Notes (Signed)
Patient was given something to eat and drink  

## 2024-03-21 ENCOUNTER — Ambulatory Visit: Admitting: Physical Therapy

## 2024-03-21 DIAGNOSIS — M5459 Other low back pain: Secondary | ICD-10-CM
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
## 2024-03-23 ENCOUNTER — Ambulatory Visit: Admitting: Physical Therapy

## 2024-03-23 DIAGNOSIS — M5459 Other low back pain: Secondary | ICD-10-CM | POA: Diagnosis not present

## 2024-03-23 DIAGNOSIS — N3000 Acute cystitis without hematuria: Secondary | ICD-10-CM | POA: Diagnosis not present
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
## 2024-03-25 ENCOUNTER — Other Ambulatory Visit (HOSPITAL_COMMUNITY): Payer: Self-pay | Admitting: Neurosurgery

## 2024-03-25 DIAGNOSIS — M5440 Lumbago with sciatica, unspecified side: Secondary | ICD-10-CM
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
## 2024-03-28 ENCOUNTER — Other Ambulatory Visit: Payer: Self-pay

## 2024-03-28 ENCOUNTER — Encounter: Payer: Self-pay | Admitting: Physician Assistant

## 2024-03-28 ENCOUNTER — Emergency Department
Admission: EM | Admit: 2024-03-28 | Discharge: 2024-03-28 | Disposition: A | Discharge status: Home / Self Care | Attending: Emergency Medicine | Admitting: Emergency Medicine

## 2024-03-28 ENCOUNTER — Ambulatory Visit: Admitting: Physical Therapy

## 2024-03-28 ENCOUNTER — Ambulatory Visit (INDEPENDENT_AMBULATORY_CARE_PROVIDER_SITE_OTHER): Admitting: Physician Assistant

## 2024-03-28 VITALS — BP 68/42 | HR 18 | Resp 16 | Wt 183.9 lb

## 2024-03-28 DIAGNOSIS — F32A Depression, unspecified: Secondary | ICD-10-CM

## 2024-03-28 DIAGNOSIS — I952 Hypotension due to drugs: Secondary | ICD-10-CM | POA: Insufficient documentation

## 2024-03-28 DIAGNOSIS — I951 Orthostatic hypotension: Secondary | ICD-10-CM

## 2024-03-28 DIAGNOSIS — I5032 Chronic diastolic (congestive) heart failure: Secondary | ICD-10-CM

## 2024-03-28 DIAGNOSIS — R58 Hemorrhage, not elsewhere classified: Secondary | ICD-10-CM | POA: Diagnosis not present

## 2024-03-28 DIAGNOSIS — R55 Syncope and collapse: Secondary | ICD-10-CM

## 2024-03-28 DIAGNOSIS — I10 Essential (primary) hypertension: Secondary | ICD-10-CM | POA: Insufficient documentation

## 2024-03-28 DIAGNOSIS — F419 Anxiety disorder, unspecified: Secondary | ICD-10-CM

## 2024-03-28 DIAGNOSIS — T463X5A Adverse effect of coronary vasodilators, initial encounter: Secondary | ICD-10-CM | POA: Diagnosis not present

## 2024-03-28 LAB — CBC WITH DIFFERENTIAL/PLATELET
Abs Immature Granulocytes: 0.01 K/uL (ref 0.00–0.07)
Basophils Absolute: 0 K/uL (ref 0.0–0.1)
Basophils Relative: 1 %
Eosinophils Absolute: 0.1 K/uL (ref 0.0–0.5)
Eosinophils Relative: 2 %
HCT: 38.1 % (ref 36.0–46.0)
Hemoglobin: 12.3 g/dL (ref 12.0–15.0)
Immature Granulocytes: 0 %
Lymphocytes Relative: 27 %
Lymphs Abs: 2 K/uL (ref 0.7–4.0)
MCH: 30.6 pg (ref 26.0–34.0)
MCHC: 32.3 g/dL (ref 30.0–36.0)
MCV: 94.8 fL (ref 80.0–100.0)
Monocytes Absolute: 0.6 K/uL (ref 0.1–1.0)
Monocytes Relative: 7 %
Neutro Abs: 4.7 K/uL (ref 1.7–7.7)
Neutrophils Relative %: 63 %
Platelets: 236 K/uL (ref 150–400)
RBC: 4.02 MIL/uL (ref 3.87–5.11)
RDW: 13.2 % (ref 11.5–15.5)
WBC: 7.5 K/uL (ref 4.0–10.5)
nRBC: 0 % (ref 0.0–0.2)

## 2024-03-28 LAB — COMPREHENSIVE METABOLIC PANEL WITH GFR
ALT: 51 U/L — ABNORMAL HIGH (ref 0–44)
AST: 55 U/L — ABNORMAL HIGH (ref 15–41)
Albumin: 3.6 g/dL (ref 3.5–5.0)
Alkaline Phosphatase: 74 U/L (ref 38–126)
Anion gap: 10 (ref 5–15)
BUN: 9 mg/dL (ref 8–23)
CO2: 26 mmol/L (ref 22–32)
Calcium: 9.5 mg/dL (ref 8.9–10.3)
Chloride: 109 mmol/L (ref 98–111)
Creatinine, Ser: 1.25 mg/dL — ABNORMAL HIGH (ref 0.44–1.00)
GFR, Estimated: 47 mL/min — ABNORMAL LOW (ref >60)
Glucose, Bld: 110 mg/dL — ABNORMAL HIGH (ref 70–99)
Potassium: 3.3 mmol/L — ABNORMAL LOW (ref 3.5–5.1)
Sodium: 145 mmol/L (ref 135–145)
Total Bilirubin: 0.5 mg/dL (ref 0.0–1.2)
Total Protein: 6.6 g/dL (ref 6.5–8.1)

## 2024-03-28 LAB — TROPONIN I (HIGH SENSITIVITY)
Troponin I (High Sensitivity): 4 ng/L (ref <18)
Troponin I (High Sensitivity): 4 ng/L (ref <18)

## 2024-03-28 MED ORDER — LACTATED RINGERS IV BOLUS
1000.0000 mL | Freq: Once | INTRAVENOUS | Status: AC
  Administered 2024-03-28: 1000 mL via INTRAVENOUS

## 2024-03-28 NOTE — ED Provider Notes (Signed)
 [facility] Provider Note   Event Date/Time   First MD Initiated Contact with Patient 03/28/24 1534     (approximate) History  Dizziness and Hypotension  HPI Valerie Vazquez is a 68 y.o. female with a past medical history of hypertension who presents complaining of orthostatic lightheadedness with associated blurred vision that has been present over the last week.  Patient states that she was admitted for similar symptoms and told to decrease her Imdur  to 15 mg however she is continuing to have orthostatic lightheadedness similar to previous. ROS: Patient currently denies any tinnitus, difficulty speaking, facial droop, sore throat, chest pain, shortness of breath, abdominal pain, nausea/vomiting/diarrhea, dysuria, or weakness/numbness/paresthesias in any extremity   Physical Exam  Triage Vital Signs: ED Triage Vitals  Encounter Vitals Group     BP --      Girls Systolic BP Percentile --      Girls Diastolic BP Percentile --      Boys Systolic BP Percentile --      Boys Diastolic BP Percentile --      Pulse Rate 03/28/24 1533 79     Resp 03/28/24 1533 18     Temp --      Temp src --      SpO2 --      Weight 03/28/24 1534 183 lb 13.8 oz (83.4 kg)     Height 03/28/24 1534 5' 6 (1.676 m)     Head Circumference --      Peak Flow --      Pain Score 03/28/24 1533 0     Pain Loc --      Pain Education --      Exclude from Growth Chart --    Most recent vital signs: Vitals:   03/28/24 2000 03/28/24 2014  BP: (!) 162/86   Pulse: 70   Resp: 20   Temp:  97.9 F (36.6 C)  SpO2: 98%    General: Awake, oriented x4. CV:  Good peripheral perfusion. Resp:  Normal effort. Abd:  No distention. Other:  Elderly overweight African-American female resting comfortably in no acute distress ED Results / Procedures / Treatments  Labs (all labs ordered are listed, but only abnormal results are displayed) Labs Reviewed  COMPREHENSIVE METABOLIC PANEL WITH GFR -  Abnormal; Notable for the following components:      Result Value   Potassium 3.3 (*)    Glucose, Bld 110 (*)    Creatinine, Ser 1.25 (*)    AST 55 (*)    ALT 51 (*)    GFR, Estimated 47 (*)    All other components within normal limits  CBC WITH DIFFERENTIAL/PLATELET  TROPONIN I (HIGH SENSITIVITY)  TROPONIN I (HIGH SENSITIVITY)   EKG ED ECG REPORT I, Artist MARLA Kerns, the attending physician, personally viewed and interpreted this ECG. Date: 03/28/2024 EKG Time: 1537 Rate: 73 Rhythm: normal sinus rhythm QRS Axis: normal Intervals: normal ST/T Wave abnormalities: normal Narrative Interpretation: no evidence of acute ischemia PROCEDURES: Critical Care performed: No .1-3 Lead EKG Interpretation  Performed by: Kerns Artist MARLA, MD Authorized by: Kerns Artist MARLA, MD     Interpretation: normal     ECG rate:  71   ECG rate assessment: normal     Rhythm: sinus rhythm     Ectopy: none     Conduction: normal    MEDICATIONS ORDERED IN ED: Medications  lactated ringers  bolus 1,000 mL (0 mLs Intravenous Stopped 03/28/24 1834)  lactated ringers  bolus  1,000 mL (0 mLs Intravenous Stopped 03/28/24 2131)   IMPRESSION / MDM / ASSESSMENT AND PLAN / ED COURSE  I reviewed the triage vital signs and the nursing notes.                             The patient is on the cardiac monitor to evaluate for evidence of arrhythmia and/or significant heart rate changes. Patient's presentation is most consistent with acute presentation with potential threat to life or bodily function. This patient presents with orthostatic hypotension with associated blurred vision likely secondary to medications. Doubt intrinsic renal dysfunction or obstructive nephropathy. Considered alternate etiologies of the patient's symptoms including infectious processes, severe metabolic derangements or electrolyte abnormalities, ischemia/ACS, heart failure, and intracranial/central processes but think these are unlikely given the  history and physical exam.  Plan: labs, 2L fluid resuscitation, pain/nausea control, reassessment Patient's blood pressure normalized with fluid resuscitation as well as likely metabolization of her Imdur .  Patient was asked to hold her Imdur  as well as take her blood pressure over the next 2-3 days and follow-up with her primary care provider/cardiology for further management of her antihyperglycemic medications  Dispo: Discharge home with PCP follow-up   FINAL CLINICAL IMPRESSION(S) / ED DIAGNOSES   Final diagnoses:  Hypotension due to drugs  Orthostatic hypotension   Rx / DC Orders   ED Discharge Orders     None      Note:  This document was prepared using Dragon voice recognition software and may include unintentional dictation errors.   [name], MD 03/28/24 2232

## 2024-03-28 NOTE — ED Notes (Addendum)
 Answered call bell. Pt ambulated to toilet in bathroom. Pt denies dizziness with ambulation but reports mild blurred vision - states has been ongoing and BP medication was cut in half previously due to side effects. Urine sample sent to lab with save tube label affixed. Warm blankets given. Nonskid socks placed on pt.

## 2024-03-28 NOTE — ED Triage Notes (Signed)
 Pt arrives via [facility] from pcp office for dizziness and hypotension. Pt axox4 on arrival. Pt takes 15mg  isosorbide  daily for HTN. Pt seen here a week ago for same.

## 2024-03-28 NOTE — Discharge Instructions (Signed)
 Please hold your Imdur  until multiple readings of your blood pressure for the next few mornings.  Please contact your primary care physician or cardiologist for further management of your hypertension

## 2024-03-28 NOTE — ED Notes (Signed)
 Patient given discharge instructions including importance of not taking IMDUR  until follow up with PCP with stated understanding. INT removed, cannula intact, pressure dressing applied. Patient stable and wheeled out to ride with spouse on dispo.

## 2024-03-29 ENCOUNTER — Ambulatory Visit
Admission: RE | Admit: 2024-03-29 | Discharge: 2024-03-29 | Disposition: A | Discharge status: Home / Self Care | Source: Ambulatory Visit | Attending: Family Medicine | Admitting: Family Medicine

## 2024-03-29 DIAGNOSIS — N644 Mastodynia: Secondary | ICD-10-CM | POA: Insufficient documentation

## 2024-03-29 DIAGNOSIS — N6325 Unspecified lump in the left breast, overlapping quadrants: Secondary | ICD-10-CM | POA: Diagnosis not present

## 2024-03-29 DIAGNOSIS — I951 Orthostatic hypotension: Secondary | ICD-10-CM | POA: Insufficient documentation

## 2024-03-29 DIAGNOSIS — R92322 Mammographic fibroglandular density, left breast: Secondary | ICD-10-CM | POA: Diagnosis not present

## 2024-03-29 DIAGNOSIS — R928 Other abnormal and inconclusive findings on diagnostic imaging of breast: Secondary | ICD-10-CM | POA: Diagnosis not present

## 2024-03-29 DIAGNOSIS — R55 Syncope and collapse: Secondary | ICD-10-CM | POA: Insufficient documentation

## 2024-03-30 DIAGNOSIS — E039 Hypothyroidism, unspecified: Secondary | ICD-10-CM | POA: Diagnosis not present

## 2024-03-30 DIAGNOSIS — E119 Type 2 diabetes mellitus without complications: Secondary | ICD-10-CM | POA: Diagnosis not present

## 2024-03-31 ENCOUNTER — Ambulatory Visit: Admitting: Physical Therapy

## 2024-03-31 ENCOUNTER — Encounter: Payer: Self-pay | Admitting: Emergency Medicine

## 2024-03-31 ENCOUNTER — Emergency Department
Admission: EM | Admit: 2024-03-31 | Discharge: 2024-03-31 | Disposition: A | Discharge status: Home / Self Care | Attending: Emergency Medicine | Admitting: Emergency Medicine

## 2024-03-31 ENCOUNTER — Other Ambulatory Visit: Payer: Self-pay

## 2024-03-31 DIAGNOSIS — R42 Dizziness and giddiness: Secondary | ICD-10-CM | POA: Diagnosis present

## 2024-03-31 DIAGNOSIS — I2510 Atherosclerotic heart disease of native coronary artery without angina pectoris: Secondary | ICD-10-CM | POA: Diagnosis not present

## 2024-03-31 DIAGNOSIS — I951 Orthostatic hypotension: Secondary | ICD-10-CM | POA: Diagnosis not present

## 2024-03-31 DIAGNOSIS — I509 Heart failure, unspecified: Secondary | ICD-10-CM | POA: Insufficient documentation

## 2024-03-31 DIAGNOSIS — E039 Hypothyroidism, unspecified: Secondary | ICD-10-CM | POA: Diagnosis not present

## 2024-03-31 DIAGNOSIS — E119 Type 2 diabetes mellitus without complications: Secondary | ICD-10-CM | POA: Diagnosis not present

## 2024-03-31 DIAGNOSIS — M5459 Other low back pain: Secondary | ICD-10-CM | POA: Diagnosis not present

## 2024-03-31 LAB — URINALYSIS, ROUTINE W REFLEX MICROSCOPIC
Bilirubin Urine: NEGATIVE
Glucose, UA: >=500 mg/dL — AB
Hgb urine dipstick: NEGATIVE
Ketones, ur: NEGATIVE mg/dL
Nitrite: NEGATIVE
Protein, ur: NEGATIVE mg/dL
Specific Gravity, Urine: 1.028 (ref 1.005–1.030)
pH: 5 (ref 5.0–8.0)

## 2024-03-31 LAB — COMPREHENSIVE METABOLIC PANEL WITH GFR
ALT: 49 U/L — ABNORMAL HIGH (ref 0–44)
AST: 46 U/L — ABNORMAL HIGH (ref 15–41)
Albumin: 3.6 g/dL (ref 3.5–5.0)
Alkaline Phosphatase: 78 U/L (ref 38–126)
Anion gap: 9 (ref 5–15)
BUN: 9 mg/dL (ref 8–23)
CO2: 22 mmol/L (ref 22–32)
Calcium: 9.4 mg/dL (ref 8.9–10.3)
Chloride: 110 mmol/L (ref 98–111)
Creatinine, Ser: 1.01 mg/dL — ABNORMAL HIGH (ref 0.44–1.00)
GFR, Estimated: >60 mL/min (ref >60)
Glucose, Bld: 143 mg/dL — ABNORMAL HIGH (ref 70–99)
Potassium: 3.5 mmol/L (ref 3.5–5.1)
Sodium: 141 mmol/L (ref 135–145)
Total Bilirubin: 0.5 mg/dL (ref 0.0–1.2)
Total Protein: 6.8 g/dL (ref 6.5–8.1)

## 2024-03-31 LAB — CBC
HCT: 37.8 % (ref 36.0–46.0)
Hemoglobin: 12.2 g/dL (ref 12.0–15.0)
MCH: 31 pg (ref 26.0–34.0)
MCHC: 32.3 g/dL (ref 30.0–36.0)
MCV: 95.9 fL (ref 80.0–100.0)
Platelets: 244 K/uL (ref 150–400)
RBC: 3.94 MIL/uL (ref 3.87–5.11)
RDW: 13.2 % (ref 11.5–15.5)
WBC: 7.7 K/uL (ref 4.0–10.5)
nRBC: 0 % (ref 0.0–0.2)

## 2024-03-31 LAB — TROPONIN I (HIGH SENSITIVITY): Troponin I (High Sensitivity): 4 ng/L (ref <18)

## 2024-03-31 MED ORDER — SODIUM CHLORIDE 0.9 % IV BOLUS
1000.0000 mL | Freq: Once | INTRAVENOUS | Status: AC
  Administered 2024-03-31: 1000 mL via INTRAVENOUS

## 2024-03-31 NOTE — Therapy (Addendum)
 OUTPATIENT PHYSICAL THERAPY THORACOLUMBAR DISCHARGE     Patient Name: Valerie Vazquez MRN: [id number] DOB:1956-07-18, 68 y.o., female Today's Date: 03/31/2024  END OF SESSION:   PT End of Session - 03/31/24 1604     Visit Number 11    Number of Visits 24    Date for PT Re-Evaluation 05/10/24    Authorization Type [employer] Medicare 2025    Authorization - Visit Number 11    Authorization - Number of Visits 24    Progress Note Due on Visit 10    PT Start Time 1605    PT Stop Time 1645    PT Time Calculation (min) 40 min    Activity Tolerance Patient limited by pain    Behavior During Therapy [facility] for tasks assessed/performed             Past Medical History:  Diagnosis Date   Acute right-sided low back pain with right-sided sciatica 10/23/2017   Anemia    Anginal pain (HCC)    Bilateral ovarian cysts    Bilateral renal cysts    Bulimia nervosa 05/07/2021   CAD (coronary artery disease) 09/28/2016   Chest pain 10/14/2015   CHF (congestive heart failure) (HCC)    Diastolic   Chronic back pain    upper and lower (09/19/2014)   Chronic shoulder pain (Location of Primary Source of Pain) (Left) 01/29/2016   Colitis 04/08/2016   Colon polyp 01/30/2017   Coronary vasospasm (HCC) 12/09/2014   DDD (degenerative disc disease), cervical    a.) s/p ACDF C5-C7   Depression    DVT (deep venous thrombosis) (HCC)    Esophageal spasm    Essential hypertension 10/28/2013   Gallstones    Gastritis    Gastroenteritis    GERD (gastroesophageal reflux disease)    Heart disease 12/09/2016   High blood pressure    High cholesterol    History of blood transfusion 1985   related to hysterectomy   History of left heart catheterization (LHC) 12/09/2014   a.) LHC 12/09/2014: EF 50%; normal coronaries.   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 Rule out Paget's disease of the bone with blood work.    Hyperlipidemia 10/28/2013   Hypertension    Hypothyroidism    IBS (irritable bowel syndrome)    Long term current use of anticoagulant    a.) apixaban    Lumbar central spinal stenosis (moderate at L4-5; mild at L2-3 and L3-4) 01/29/2016   Migraine    a few times/yr (09/19/2014)   Myocardial infarction (HCC) 10/2010 X 3   while hospitalized   Neck pain 01/29/2016   Osteoarthritis    Peripheral vascular disease (HCC)    DVT/PE. On Eliquis    Pleural effusion, bilateral 07/31/2015   Pneumonia 04/2014   PONV (postoperative nausea and vomiting)    PTSD (post-traumatic stress disorder)    Hx. None currently (2024)   Pulmonary embolism (HCC) 04/2001; 09/19/2014   after gallbladder OR;    Schizoaffective disorder, depressive type (HCC) 08/28/2016   Sleep apnea    mild (09/19/2014)   Snoring    a. sleep study 5/16: No OSA   SOB (shortness of breath) 07/31/2015   Spinal stenosis    Spondylosis    Suicidal ideation 08/27/2016   a.) seen  in ED with approx 6 months of reported SI. Reported ingestion of unknown pills to end life; last ingestion was 3 days PTA; UDS (+) for TCAs. SI related to depression, friend (loss of friend), and social determinants   Type II diabetes mellitus (HCC)    dx'd in 2007; lost weight; no RX for ~ 4 yr now (09/19/2014)   Past Surgical History:  Procedure Laterality Date   ANTERIOR CERVICAL DECOMP/DISCECTOMY FUSION  10/2012   in [place]  C5-C7    APPENDECTOMY  1985   BACK SURGERY     BREAST CYST EXCISION Right    BREAST EXCISIONAL BIOPSY Right YRS AGO   NEG   CARDIAC CATHETERIZATION   2000's; 2009   CHOLECYSTECTOMY  2002   COLONOSCOPY WITH PROPOFOL  N/A 12/12/2016   Procedure: COLONOSCOPY WITH PROPOFOL ;  Surgeon: [name], MD;  Location: [facility] ENDOSCOPY;  Service: Endoscopy;  Laterality: N/A;   COLONOSCOPY WITH PROPOFOL  N/A 02/17/2017    Procedure: COLONOSCOPY WITH PROPOFOL ;  Surgeon: Kung Ruel, MD;  Location: [facility] ENDOSCOPY;  Service: Gastroenterology;  Laterality: N/A;   COLONOSCOPY WITH PROPOFOL  N/A 11/04/2021   Procedure: COLONOSCOPY WITH PROPOFOL ;  Surgeon: Kung Ruel, MD;  Location: [facility] ENDOSCOPY;  Service: Gastroenterology;  Laterality: N/A;   CYSTOSCOPY N/A 05/15/2022   Procedure: CYSTOSCOPY;  Surgeon: Leonce Garnette BIRCH, MD;  Location: [facility] ORS;  Service: Gynecology;  Laterality: N/A;   ESOPHAGOGASTRODUODENOSCOPY N/A 03/02/2024   Procedure: EGD (ESOPHAGOGASTRODUODENOSCOPY);  Surgeon: Kung Ruel, MD;  Location: [facility] ENDOSCOPY;  Service: Gastroenterology;  Laterality: N/A;  DM also on OZEMPIC    ESOPHAGOGASTRODUODENOSCOPY (EGD) WITH PROPOFOL  N/A 02/17/2017   Procedure: ESOPHAGOGASTRODUODENOSCOPY (EGD) WITH PROPOFOL ;  Surgeon: Kung Ruel, MD;  Location[facility] ENDOSCOPY;  Service: Gastroenterology;  Laterality: N/A;   ESOPHAGOGASTRODUODENOSCOPY (EGD) WITH PROPOFOL  N/A 03/07/2019   Procedure: ESOPHAGOGASTRODUODENOSCOPY (EGD) WITH PROPOFOL ;  Surgeon: Kung Ruel, MD;  Location: [facility] ENDOSCOPY;  Service: Gastroenterology;  Laterality: N/A;   EXCISION/RELEASE BURSA HIP Right 12/1984   HERNIA REPAIR  1985   IVC FILTER REMOVAL     2020s   LEFT HEART CATHETERIZATION WITH CORONARY ANGIOGRAM N/A 12/09/2014   Procedure: LEFT HEART CATHETERIZATION WITH CORONARY ANGIOGRAM;  Surgeon: Lonni BIRCH Cash, MD;  Location: [facility] CATH LAB;  Service: Cardiovascular;  Laterality: N/A;   PARTIAL HYSTERECTOMY  1985   ROBOTIC ASSISTED BILATERAL SALPINGO OOPHERECTOMY Bilateral 05/15/2022   Procedure: XI ROBOTIC ASSISTED BILATERAL OOPHORECTOMY;  Surgeon: Leonce Garnette BIRCH, MD;  Location: [facility] ORS;  Service: Gynecology;  Laterality: Bilateral;   SPINE SURGERY  07/2023   TONSILLECTOMY AND ADENOIDECTOMY  ~ 1968   TOTAL HIP ARTHROPLASTY Left 04/06/2014   UMBILICAL HERNIA REPAIR  1985   VENA CAVA FILTER PLACEMENT  03/2014   Patient Active  Problem List   Diagnosis Date Noted   Near syncope 03/29/2024   Orthostatic hypotension 03/29/2024   Vaginal discharge 01/23/2024   Urethral prolapse 01/23/2024   Nocturia 01/23/2024   Incontinence of feces 01/22/2024   History of recurrent UTI (urinary tract infection) 01/22/2024   Decreased glomerular filtration rate (GFR) 01/21/2024   Influenza A 10/12/2023   Spondylolisthesis at L4-L5 level 07/27/2023   Left leg numbness 05/20/2023   Thoracic back pain 03/13/2023   Obesity (BMI 30.0-34.9) 12/10/2022   Subcutaneous nodule 06/09/2022   Numbness of right foot 03/05/2022   OAB (overactive bladder) 03/05/2022   Ingrown fingernail 12/27/2021   Cramps, extremity 11/27/2021   Skin lesion of lower extremity- right knee  09/26/2021   Retention cyst of nasal sinus 08/27/2021   Rash  08/27/2021   Bulimia 01/14/2021   Cervical radiculitis 11/15/2020   Right arm pain 11/12/2020   Bilateral low back pain without sciatica 06/20/2020   Paronychia of great toe of right foot 04/05/2020   Paronychia of great toe, right 02/27/2020   Foraminal stenosis of cervical region 01/31/2020   Cervical fusion syndrome (C5-C7 2014) 01/31/2020   Ingrown toenail 01/27/2020   Chronic diastolic heart failure (HCC) 01/27/2020   Postmenopausal estrogen deficiency 08/12/2019   Left wrist pain 05/20/2019   Allergic rhinitis 01/27/2019   Pain of right lower extremity 10/06/2018   Mixed stress and urge urinary incontinence 10/27/2017   Elevated alkaline phosphatase level 10/27/2017   Hypothyroidism 08/26/2017   Type 2 diabetes mellitus with other specified complication (HCC) 05/29/2017   Right upper quadrant pain 01/12/2017   Plantar fasciitis of left foot 11/07/2016   CAD (coronary artery disease) 09/28/2016   Epigastric discomfort 09/16/2016   Schizoaffective disorder, depressive type (HCC) 08/28/2016   Cervicalgia 01/29/2016   Chronic low back pain (Location of Secondary source of pain) (Bilateral) (R>L)  01/29/2016   Chronic pain syndrome 01/29/2016   Primary osteoarthritis of right hip 01/29/2016   Lumbar central spinal stenosis (moderate at L4-5; mild at L2-3 and L3-4) 01/29/2016   Breast pain 10/19/2015   Abdominal pain 10/14/2015   Cough 07/31/2015   Esophageal spasm 06/05/2015   History of DVT (deep vein thrombosis) 02/25/2015   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 Lauraine Buoy    REFERRING PROVIDER: Dr. Arley Helling   REFERRING DIAG: M54.40 (ICD-10-CM) - Lumbago of lumbar region with sciatica   Rationale for Evaluation and Treatment: Rehabilitation  THERAPY DIAG:  Other low back pain  ONSET DATE: 12/01/23  SUBJECTIVE:                                                                                                                                                                                           SUBJECTIVE STATEMENT: Pt reports that she had to return to ER at hospital due to low blood pressure. They advised her to go to a neurologist for concern about low perfusion and potential damage to her brain and vision.   PERTINENT HISTORY:  Pt reports that had lumbar spinal fusion in November of 2024 and she has  since developed soreness and pain in her low back that is similar to what she was feeling before the surgery. She has also had surgery on her cervical spine in September of last year and she has had a left hip replacement. She has since weaned off Tylenol  because of liver issues, so her pain is not as well controlled and she is seeking further medical treatment and has an appointment  scheduled with her physician. She is also concerned about her liver function and she is following up with physician for  CT scan.   PAIN:  Are you having pain? Yes: NPRS scale: 4/10  Pain location: Central spinous process of low back and wraps around anteriorly towards ASIS  Pain description: Dull pain and fatigue when standing and walking for long periods of time   Aggravating factors: Walking around and laying down  Relieving factors: Sitting Down, but not for a long time.   PRECAUTIONS: None  RED FLAGS: None   WEIGHT BEARING RESTRICTIONS: No  FALLS:  Has patient fallen in last 6 months? No, she had a fall in September of last year when standing up from bed because she could not feel her left foot and she lost her balance.   LIVING ENVIRONMENT: Lives with: lives alone Lives in: House/apartment Stairs: Yes: External: 3 steps; on right going up Has following equipment at home: Single point cane, Walker - 2 wheeled, Wheelchair (manual), shower chair, Tour manager, and Grab bars  OCCUPATION: Not currently working.   PLOF: Independent  PATIENT GOALS: She is limited in doing housework activities because of her low back pain. She is unable to clean up her house as a result.   NEXT MD VISIT: Doctor will schedule apt after her CT scan of low back and liver   OBJECTIVE:  Note: Objective measures were completed at Evaluation unless otherwise noted.  VITALS: BP 116/74HR 72 Sp02 98  DIAGNOSTIC FINDINGS:  CLINICAL DATA:  Chronic low back pain, right leg pain for few years   EXAM: MRI LUMBAR SPINE WITHOUT CONTRAST   TECHNIQUE: Multiplanar, multisequence MR imaging of the lumbar spine was performed. No intravenous contrast was administered.   COMPARISON:  12/25/2021, 06/21/2020   FINDINGS: Segmentation:  Standard.   Alignment:  Minimal grade 1 anterolisthesis of L4 on L5.   Vertebrae: No acute fracture, evidence of discitis, or aggressive bone lesion.   Conus medullaris  and cauda equina: Conus extends to the L1-2 level. Conus and cauda equina appear normal.   Paraspinal and other soft tissues: No acute paraspinal abnormality.   Disc levels:   Disc spaces: Degenerative disease with disc height loss at L4-5.   T12-L1: No significant disc bulge. No neural foraminal stenosis. No central canal stenosis.   L1-L2: Mild broad-based disc bulge. Moderate bilateral facet arthropathy. No foraminal or central canal stenosis.   L2-L3: Minimal broad-based disc bulge. Severe bilateral facet arthropathy. Moderate spinal stenosis. Right lateral recess stenosis. Mild right foraminal stenosis. No left foraminal stenosis.   L3-L4: Mild broad-based disc bulge. Severe bilateral facet arthropathy. Severe spinal stenosis. Mild right foraminal stenosis. No left foraminal stenosis.   L4-L5: Broad-based disc bulge. Severe bilateral facet arthropathy with ligamentum flavum infolding. Moderate-severe spinal stenosis. Moderate right foraminal stenosis. No left foraminal stenosis. Bilateral lateral recess stenosis.   L5-S1: Mild broad-based disc bulge. Severe bilateral facet arthropathy. No foraminal or central canal stenosis.   IMPRESSION: 1. Diffuse lumbar spine spondylosis as described above. 2. No acute osseous injury of the lumbar spine.  Electronically Signed   By: Julaine Blanch M.D.   On: 06/10/2023 09:52  PATIENT SURVEYS:  Modified Oswestry NT    COGNITION: Overall cognitive status: Within functional limits for tasks assessed     SENSATION: Light touch: Impaired  Neuropathy in bilateral feet    MUSCLE LENGTH: Hamstrings: Right 70 deg; Left 70 deg   POSTURE: No Significant postural limitations  PALPATION: L2-L5 CSP   LUMBAR ROM:   AROM eval  Flexion 50%*  Extension 50%*  Right lateral flexion 50%*  Left lateral flexion 50%*  Right rotation 100%  Left rotation 100%   (Blank rows = not tested)  LOWER EXTREMITY ROM:     Active   Right eval Left eval  Hip flexion    Hip extension    Hip abduction    Hip adduction    Hip internal rotation    Hip external rotation    Knee flexion    Knee extension    Ankle dorsiflexion    Ankle plantarflexion    Ankle inversion    Ankle eversion     (Blank rows = not tested)  LOWER EXTREMITY MMT:    MMT Right eval Left eval Right  03/09/24 Left  03/09/24  Hip flexion 4- 4-  4 4  Hip extension 3- 3- 4- 4-  Hip abduction 4- 4 4- 4  Hip adduction      Hip internal rotation NT  NT     Hip external rotation NT NT     Knee flexion 4 4 4+ 4+  Knee extension 4 4 4+ 4+  Ankle dorsiflexion 4 4 4+ 4+  Ankle plantarflexion      Ankle inversion      Ankle eversion       (Blank rows = not tested)  Abdominal Strength Testing: Sahrmann Testing  Level 4    LUMBAR SPECIAL TESTS:  Straight leg raise test: Negative, SI Compression/distraction test: NT , FABER test: Positive, and FADIR Positive Bilateral   FUNCTIONAL TESTS:  5 times sit to stand: 13 sec  10 meter walk test: 10/10.21 sec = 0.98 m/sec     MODI: 20/50=40%  TREATMENT DATE:   03/31/24: THEREX   Lumbar Flexion AROM Center, Right, and Left with silver ball 3 x 10   Standing Hip Extension with forward lean onto counter AROM 1 x 10   Standing Hip Extension with forward lean onto counter red band around ankles 1 x 10  -Pt reports feeling light headed     -Standing BP 52/34     -Sitting BP  92/60     -Standing BP 57/33    SELF CARE HOME MANAGEMENT   Exercise session discontinued due hypotension and blood pressure dropping while performing exercise.   Discussed prior ER notes and the assessment from physician about causes of her underlying low blood pressure.   PATIENT EDUCATION:  Education details: Form and technique for correct performance of exercise.  Person educated: Patient Education method: Explanation, Demonstration, Verbal cues, and Handouts Education comprehension: verbalized understanding, returned  demonstration, and verbal cues required  HOME EXERCISE PROGRAM: Access Code: [email or web address] URL: [email or web address] Date: 03/23/2024 Prepared by: Toribio Servant  Program Notes Towel slides on counter center, right, left 30 reps every day  Exercises - Seated Hip External Rotation Stretch  - 1 x daily - 7 x weekly - 3 reps - 30-60 sec  hold - Sidelying Quadriceps Stretch with Strap  - 1 x daily - 7 x weekly - 3 reps -  30-60 sec  hold - Seated Hamstring Stretch  - 1 x daily - 7 x weekly - 3 reps - 60 sec hold - Seated Transversus Abdominis Bracing  - 1 x daily - 7 x weekly - 2 sets - 10 reps - 5 sec  hold - Lat Pull Down   - 3-4 x weekly - 3 sets - 10 reps   ASSESSMENT:  CLINICAL IMPRESSION: Pt limited by ongoing orthostatic blood pressure with symptoms. Her blood pressures are listed above and she needed to take several seated rest breaks and her blood pressure was unable to increase. PT advised pt return to emergency room for further medical evaluation and treatment especially given that she had just been discharged from ER for hypotension. Pt to discharge pt from PT for further medical evaluation and treatment on a variety of health issues including ongoing orthostatic hypotension and low back pain. Pt called friend to drive her to ER given that she was symptomatic with light headedness and blurry vision.   OBJECTIVE IMPAIRMENTS: Abnormal gait, decreased activity tolerance, decreased balance, decreased endurance, decreased mobility, difficulty walking, decreased ROM, decreased strength, hypomobility, increased muscle spasms, impaired flexibility, impaired sensation, obesity, and pain.   ACTIVITY LIMITATIONS: carrying, lifting, bending, sitting, standing, squatting, sleeping, stairs, bathing, dressing, hygiene/grooming, and locomotion level  PARTICIPATION LIMITATIONS: meal prep, cleaning, laundry, driving, shopping, and community activity  PERSONAL FACTORS: Age, Fitness, Time  since onset of injury/illness/exacerbation, and 3+ comorbidities: HTN, HLD, Depression, Obesity, and T2DM are also affecting patient's functional outcome.   REHAB POTENTIAL: Fair chronicity of condition and prior interventions including low back surgery  CLINICAL DECISION MAKING: Evolving/moderate complexity  EVALUATION COMPLEXITY: Moderate   GOALS: Goals reviewed with patient? No  SHORT TERM GOALS: Target date: 03/01/2024  Patient will demonstrate undestanding of home exercise plan by performing exercises correctly with evidence of good carry over with min to no verbal or tactile cues .   Baseline: NT 02/29/24: Performing independently   Goal status: ACHIEVED    2.  Patient will show decreased risk of falling as evidence by decrease in self-selected gait speed by >= 0.12 m/sec.  Baseline: 0.82 m/sec  02/29/24: 0.91 m/sec  03/09/24: 0.96 m/sec Goal status: ACHIEVED    LONG TERM GOALS: Target date: 05/11/2024  Patient will improve modified Oswestry Disability Index (MODI) score by >=13 points as evidence of the minimal statistically significant change for improvement with low back pain disability and improvement in low back function (Copay et al, 2008) Baseline: 40%  03/09/24: 24/50 (48%)  03/23/24: 32/50 (64%) Goal status: NOT MET   2.  Patient will improve hip strength by 1/3 grade (I.E 4- to 4+) and abdominal strength by >= 1 grade for improved lumbar stability and to assist low back in absorbing mechanical forces in weight bearing positions for improved symptom response. Baseline: Hip MMT Flex R/L 4-/4-, Hip Ext R/L 3-/3-, Sahrmann level 4  03/09/24: Hip Flex R/L 4/4, Hip Ext 4-/4-, Hip Abd 4-/4  Goal status: NOT MET     3.  Patient will perform 5 x STS in <12.6 sec to demonstrate improve LE strength to decrease risk of falling (Bohannon, 2006). Baseline: 18 sec  03/09/24: 13 sec   Goal status: NOT MET    4.  Patient will perform in >=1.0 m./sec as evidence of improved mobility,  decreased falls risk, and overall functional independence.  Baseline: 0.82 m/sec 02/29/24:  0.91 m/sec  03/09/24: 0.96 m/sec  Goal status: NOT MET  5.  Patient will demonstrate improved low back pain by being able to stand for >=5 min in order to wash dishes and clean home.  Baseline: NT 03/07/24: Feels increased low back fatigue 5-6/10 NRPS  03/23/24: 6-7/10 NRPS  Goal status: NOT MET    6.  Patient will be able to walk for >=10 min without being limited by low back pain to the point where she needs to take a seated or standing rest break as evidence of improved low back function and to return to walking community level distances.  Baseline:  Needs to take seated rest break after standing for 10 min   Goal status: NOT MET    PLAN:  PT FREQUENCY: 1-2x/week  PT DURATION: 12 weeks  PLANNED INTERVENTIONS: [id number]- PT Re-evaluation, 97750- Physical Performance Testing, 97110-Therapeutic exercises, 97530- Therapeutic activity, [id number]- Neuromuscular re-education, 97535- Self Care, [id number]- Manual therapy, [id number]- Gait training, [phone]- Orthotic Initial, [id number]- Orthotic/Prosthetic subsequent, [phone]- Canalith repositioning, [id number]- Aquatic Therapy, H9716- Electrical stimulation (unattended), [phone]- Electrical stimulation (manual), [id number]- Ultrasound, [id number]- Traction (mechanical), [id number]- Wound care (first 20 sq cm), 97598- Wound care (each additional 20 sq cm), 20560 (1-2 muscles), 20561 (3+ muscles)- Dry Needling, Patient/Family education, Balance training, Stair training, Joint mobilization, Joint manipulation, Spinal manipulation, Spinal mobilization, Vestibular training, DME instructions, Cryotherapy, and Moist heat.  PLAN FOR NEXT SESSION: Discharge from PT    Toribio Servant PT, DPT  [facility] [address], [place], [address] Phone: [phone]   Fax:  [phone]

## 2024-03-31 NOTE — ED Provider Notes (Signed)
 [facility] Provider Note    Event Date/Time   First MD Initiated Contact with Patient 03/31/24 1833     (approximate)  History   Chief Complaint: Hypotension  HPI  SADEEL FIDDLER is a 68 y.o. female with a past medical history of anemia, CAD, CHF, DVT, gastric reflux, hyperlipidemia, presents to the emergency department for hypotension.  According to the patient she was at physical therapy when she began feeling lightheaded and weak.  They checked her blood pressure and it was low so they sent her to the emergency department for evaluation.  Patient states she has a history of low blood pressure in the past especially when she stands up.  Patient's blood pressure in the emergency department is 110/65.  Patient states slight dizziness currently but denies any chest pain or shortness of breath at any point.  Patient states she is feeling better lying down in the bed.  Physical Exam   Triage Vital Signs: ED Triage Vitals  Encounter Vitals Group     BP 03/31/24 1717 110/65     Girls Systolic BP Percentile --      Girls Diastolic BP Percentile --      Boys Systolic BP Percentile --      Boys Diastolic BP Percentile --      Pulse Rate 03/31/24 1717 77     Resp 03/31/24 1717 18     Temp 03/31/24 1717 98.4 F (36.9 C)     Temp Source 03/31/24 1717 Oral     SpO2 03/31/24 1717 97 %     Weight 03/31/24 1716 183 lb (83 kg)     Height 03/31/24 1716 5' 6 (1.676 m)     Head Circumference --      Peak Flow --      Pain Score 03/31/24 1716 0     Pain Loc --      Pain Education --      Exclude from Growth Chart --     Most recent vital signs: Vitals:   03/31/24 1717 03/31/24 1812  BP: 110/65   Pulse: 77   Resp: 18   Temp: 98.4 F (36.9 C)   SpO2: 97% 98%    General: Awake, no distress.  CV:  Good peripheral perfusion.  Regular rate and rhythm  Resp:  Normal effort.  Equal breath sounds bilaterally.  Abd:  No distention.  Soft, nontender.  No rebound  or guarding.  ED Results / Procedures / Treatments   EKG  EKG viewed and interpreted by myself shows a normal sinus rhythm at 77 bpm with a narrow QRS, normal axis, normal intervals, nonspecific ST changes in the inferolateral leads. Largely unchanged from prior EKG.   MEDICATIONS ORDERED IN ED: Medications  sodium chloride  0.9 % bolus 1,000 mL (has no administration in time range)     IMPRESSION / MDM / ASSESSMENT AND PLAN / ED COURSE  I reviewed the triage vital signs and the nursing notes.  Patient's presentation is most consistent with acute presentation with potential threat to life or bodily function.  Patient presents emergency department for low blood pressure earlier today while at physical therapy.  Overall patient appears well blood pressure and other vitals are reassuring.  Patient's lab work shows reassuring CBC reassuring chemistry with normal renal function.  I have added on a troponin as a precaution.  EKG shows no changes from prior.  Patient's urinalysis shows no significant findings.  Chemistry is reassuring CBC is  reassuring and the troponin is negative.  Patient's blood pressure is now hypertensive 169/89.  Orthostatics are largely normal/unchanged upon standing.  Patient is feeling much better ready to go home.  Will discharge the patient home discussed with the patient oral hydration at home especially prior to physical activity such as therapy.  Provided my typical return precautions for orthostatic hypotension.  FINAL CLINICAL IMPRESSION(S) / ED DIAGNOSES   Orthostatic hypotension   Note:  This document was prepared using Dragon voice recognition software and may include unintentional dictation errors.   Dorothyann Drivers, MD 03/31/24 2153

## 2024-03-31 NOTE — ED Triage Notes (Signed)
 Pt via POV from home. Pt c/o low BP, report some dizziness and blurry vision states she has been seen here for orthostatic hypotension on Monday and d/c from the ED. States the dizziness is worse upon standing. Pt is A&OX4 and NAD

## 2024-04-01 DIAGNOSIS — K582 Mixed irritable bowel syndrome: Secondary | ICD-10-CM | POA: Diagnosis not present

## 2024-04-01 DIAGNOSIS — E119 Type 2 diabetes mellitus without complications: Secondary | ICD-10-CM | POA: Diagnosis not present

## 2024-04-01 DIAGNOSIS — E7849 Other hyperlipidemia: Secondary | ICD-10-CM | POA: Diagnosis not present

## 2024-04-01 DIAGNOSIS — F259 Schizoaffective disorder, unspecified: Secondary | ICD-10-CM | POA: Diagnosis not present

## 2024-04-01 DIAGNOSIS — E038 Other specified hypothyroidism: Secondary | ICD-10-CM | POA: Diagnosis not present

## 2024-04-01 DIAGNOSIS — Z5982 Transportation insecurity: Secondary | ICD-10-CM | POA: Diagnosis not present

## 2024-04-01 DIAGNOSIS — Z86711 Personal history of pulmonary embolism: Secondary | ICD-10-CM | POA: Diagnosis not present

## 2024-04-01 DIAGNOSIS — M84372S Stress fracture, left ankle, sequela: Secondary | ICD-10-CM | POA: Diagnosis not present

## 2024-04-01 DIAGNOSIS — K2100 Gastro-esophageal reflux disease with esophagitis, without bleeding: Secondary | ICD-10-CM | POA: Diagnosis not present

## 2024-04-02 DIAGNOSIS — E039 Hypothyroidism, unspecified: Secondary | ICD-10-CM | POA: Diagnosis not present

## 2024-04-02 DIAGNOSIS — E119 Type 2 diabetes mellitus without complications: Secondary | ICD-10-CM | POA: Diagnosis not present

## 2024-04-04 ENCOUNTER — Telehealth: Payer: Self-pay | Admitting: Physical Therapy

## 2024-04-04 ENCOUNTER — Ambulatory Visit: Payer: Self-pay | Admitting: Physical Therapy

## 2024-04-04 NOTE — Telephone Encounter (Signed)
 Called pt to notify her that she is being discharged due to ongoing non-MSK concerns such as orthostatic hypotension and low back pain that has not improved. She has upcoming appointments scheduled with cardiology and with neurosurgeon and she is aware of her discharge from PT.

## 2024-04-05 ENCOUNTER — Ambulatory Visit

## 2024-04-05 ENCOUNTER — Encounter: Payer: Self-pay | Admitting: Medical

## 2024-04-05 ENCOUNTER — Ambulatory Visit: Attending: Medical | Admitting: Medical

## 2024-04-05 VITALS — BP 120/80 | HR 77 | Ht 66.0 in | Wt 185.6 lb

## 2024-04-05 DIAGNOSIS — R55 Syncope and collapse: Secondary | ICD-10-CM

## 2024-04-05 DIAGNOSIS — I10 Essential (primary) hypertension: Secondary | ICD-10-CM

## 2024-04-05 DIAGNOSIS — I201 Angina pectoris with documented spasm: Secondary | ICD-10-CM | POA: Diagnosis not present

## 2024-04-05 DIAGNOSIS — I951 Orthostatic hypotension: Secondary | ICD-10-CM

## 2024-04-05 DIAGNOSIS — E782 Mixed hyperlipidemia: Secondary | ICD-10-CM
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
## 2024-04-05 NOTE — Patient Instructions (Addendum)
 Medication Instructions:  Your physician recommends that you continue on your current medications as directed. Please refer to the Current Medication list given to you today.    *If you need a refill on your cardiac medications before your next appointment, please call your pharmacy*  Lab Work: Your provider would like for you to have following labs drawn today Mg.     Testing/Procedures: Your physician has requested that you have an echocardiogram. Echocardiography is a painless test that uses sound waves to create images of your heart. It provides your doctor with information about the size and shape of your heart and how well your heart's chambers and valves are working.   You may receive an ultrasound enhancing agent through an IV if needed to better visualize your heart during the echo. This procedure takes approximately one hour.  There are no restrictions for this procedure.  This will take place at [address] [facility] [address]) [address], [place] [address]  Please note: We ask at that you not bring children with you during ultrasound (echo/ vascular) testing. Due to room size and safety concerns, children are not allowed in the ultrasound rooms during exams. Our front office staff cannot provide observation of children in our lobby area while testing is being conducted. An adult accompanying a patient to their appointment will only be allowed in the ultrasound room at the discretion of the ultrasound technician under special circumstances. We apologize for any inconvenience.   Your physician has requested that you have a carotid duplex. This test is an ultrasound of the carotid arteries in your neck. It looks at blood flow through these arteries that supply the brain with blood.   Allow one hour for this exam.  There are no restrictions or special instructions.  This will take place at [address] [facility] [address] [facility]) [address], [place] [address]  Please note: We ask  at that you not bring children with you during ultrasound (echo/ vascular) testing. Due to room size and safety concerns, children are not allowed in the ultrasound rooms during exams. Our front office staff cannot provide observation of children in our lobby area while testing is being conducted. An adult accompanying a patient to their appointment will only be allowed in the ultrasound room at the discretion of the ultrasound technician under special circumstances. We apologize for any inconvenience.   Your physician has recommended that you wear a 14 day Zio monitor.   This monitor is a medical device that records the heart's electrical activity. Doctors most often use these monitors to diagnose arrhythmias. Arrhythmias are problems with the speed or rhythm of the heartbeat. The monitor is a small device applied to your chest. You can wear one while you do your normal daily activities. While wearing this monitor if you have any symptoms to push the button and record what you felt. Once you have worn this monitor for the period of time provider prescribed (Usually 14 days), you will return the monitor device in the postage paid box. Once it is returned they will download the data collected and provide us  with a report which the provider will then review and we will call you with those results. Important tips:  Avoid showering during the first 24 hours of wearing the monitor. Avoid excessive sweating to help maximize wear time. Do not submerge the device, no hot tubs, and no swimming pools. Keep any lotions or oils away from the patch. After 24 hours you may shower with  the patch on. Take brief showers with your back facing the shower head.  Do not remove patch once it has been placed because that will interrupt data and decrease adhesive wear time. Push the button when you have any symptoms and write down what you were feeling. Once you have completed wearing your monitor, remove and place into box  which has postage paid and place in your outgoing mailbox.  If for some reason you have misplaced your box then call our office and we can provide another box and/or mail it off for you.     Follow-Up: At [facility], you and your health needs are our priority.  As part of our continuing mission to provide you with exceptional heart care, our providers are all part of one team.  This team includes your primary Cardiologist (physician) and Advanced Practice Providers or APPs (Physician Assistants and Nurse Practitioners) who all work together to provide you with the care you need, when you need it.  Your next appointment:   4-6 week(s)  Provider:   Redell Cave, MD or Cadence Franchester, PA-C

## 2024-04-06 ENCOUNTER — Ambulatory Visit: Admitting: Family Medicine

## 2024-04-06 ENCOUNTER — Ambulatory Visit: Payer: Self-pay | Admitting: Medical

## 2024-04-06 ENCOUNTER — Encounter: Payer: Self-pay | Admitting: Family Medicine

## 2024-04-06 VITALS — BP 133/80 | HR 87 | Ht 66.0 in | Wt 184.4 lb

## 2024-04-06 DIAGNOSIS — I700 Atherosclerosis of aorta: Secondary | ICD-10-CM

## 2024-04-06 DIAGNOSIS — M48062 Spinal stenosis, lumbar region with neurogenic claudication: Secondary | ICD-10-CM

## 2024-04-06 DIAGNOSIS — E1159 Type 2 diabetes mellitus with other circulatory complications: Secondary | ICD-10-CM

## 2024-04-06 DIAGNOSIS — I5032 Chronic diastolic (congestive) heart failure: Secondary | ICD-10-CM

## 2024-04-06 DIAGNOSIS — H539 Unspecified visual disturbance: Secondary | ICD-10-CM | POA: Diagnosis not present

## 2024-04-06 DIAGNOSIS — I152 Hypertension secondary to endocrine disorders: Secondary | ICD-10-CM | POA: Diagnosis not present

## 2024-04-06 DIAGNOSIS — I951 Orthostatic hypotension: Secondary | ICD-10-CM

## 2024-04-06 DIAGNOSIS — R55 Syncope and collapse: Secondary | ICD-10-CM

## 2024-04-06 LAB — MAGNESIUM: Magnesium: 2.1 mg/dL (ref 1.6–2.3)

## 2024-04-06 MED ORDER — OZEMPIC (0.25 OR 0.5 MG/DOSE) 2 MG/3ML ~~LOC~~ SOPN: 0.5000 mg | PEN_INJECTOR | SUBCUTANEOUS | 2 refills | Status: DC

## 2024-04-06 NOTE — Assessment & Plan Note (Signed)
 Currently scheduled for echocardiogram. Follows with cardiology; defer to specialist management.

## 2024-04-06 NOTE — Patient Instructions (Addendum)
 Call [facility] to schedule an appointment to be evaluated for recurrent syncope (pronounced sin-co-pee) (passing out). [address] Valerie Vazquez [place], [place] [address] Phone: [phone]  Make an appointment with your eye doctor.

## 2024-04-06 NOTE — Progress Notes (Signed)
 Established patient visit   Patient: Valerie Vazquez   DOB: [birth year]   68 y.o. Female  MRN: [id number] Visit Date: 04/06/2024  Today's healthcare provider: LAURAINE LOISE BUOY, DO   Chief Complaint  Patient presents with   Medical Management of Chronic Issues    Patient reports she is taking medications as prescribed. Reports some pain around navel but believes that is due to injection. Lower back pain is still present and doctor is wanting to do an injection and MRI that is scheduled for Friday.    Subjective    HPI Valerie Vazquez is a 68 year old female with orthostatic hypotension and heart failure who presents with dizziness and lightheadedness.  She experiences dizziness and lightheadedness, described as feeling 'real weird' and similar to the sensation of being given a drug. These symptoms occur upon waking and persist throughout the day, affecting her ability to move quickly without feeling off balance. Her dizziness was evaluated in the cardiologist's office, and she recalls being told she had orthostatic hypotension.  She experiences episodes of near syncope, characterized by a sensation of her body feeling heavy and an inability to stand, accompanied by visual disturbances such as seeing black spots that resemble 'torn material.' These episodes have led to multiple hospital visits where she received fluids, but her blood pressure continues to fluctuate significantly, with readings reported to be as low as 52/34 mmHg during physical therapy.  She has a history of heart failure and has been managing her condition with medications including Lasix , which she has not needed recently. Her blood pressure has been difficult to manage, with episodes of both high and low readings. She is also on Ozempic  and Jardiance . Her blood sugars have been stable, staying below 100 mg/dL.  She reports a history of arm pain, which she experienced again recently upon waking. The pain was severe  initially but improved throughout the day. She has a history of irritable bowel syndrome and indigestion, which she sometimes associates with her arm and chest pain.  She underwent an endoscopy a few weeks ago, which revealed an inflamed esophagus. She is scheduled to discuss these results with her GI doctor in September.      Medications: Outpatient Medications Prior to Visit  Medication Sig Note   albuterol  (VENTOLIN  HFA) 108 (90 Base) MCG/ACT inhaler TAKE 2 PUFFS BY MOUTH EVERY 6 HOURS AS NEEDED FOR WHEEZE OR SHORTNESS OF BREATH    apixaban  (ELIQUIS ) 5 MG TABS tablet Take 1 tablet (5 mg total) by mouth 2 (two) times daily.    b complex vitamins capsule Take 1 capsule by mouth daily.    baclofen  (LIORESAL ) 10 MG tablet TAKE ONE TABLET BY MOUTH THREE TIMES DAILY AS NEEDED FOR MUSCLE SPASMS (VIAL)    blood glucose meter kit and supplies KIT Dispense based on patient and insurance preference. Use once daily fasting. Dx code E11.9.    Blood Glucose Monitoring Suppl (GHT BLOOD GLUCOSE MONITOR) w/Device KIT DISPENSE BASED ON PATIENT AND INSURANCE PREFERENCE. USE ONCE DAILY AS DIRECTED. DX CODE E11.9.    dicyclomine  (BENTYL ) 20 MG tablet TAKE ONE TABLET BY MOUTH THREE TIMES DAILY AS NEEDED FOR ABDOMINAL SPASMS (VIAL)    empagliflozin  (JARDIANCE ) 10 MG TABS tablet Take 1 tablet (10 mg total) by mouth daily before breakfast.    escitalopram  (LEXAPRO ) 20 MG tablet TAKE ONE TABLET BY MOUTH DAILY AT 9AM    estradiol  (ESTRACE ) 0.1 MG/GM vaginal cream Place 0.5 g vaginally 2 (  two) times a week. Place 0.5g nightly for two weeks then twice a week after    famotidine  (PEPCID ) 20 MG tablet TAKE 1 TABLET BY MOUTH EVERYDAY AT BEDTIME    ferrous sulfate  325 (65 FE) MG tablet Take 325 mg by mouth daily with breakfast.    fluticasone  (FLONASE ) 50 MCG/ACT nasal spray SPRAY 2 SPRAYS INTO EACH NOSTRIL EVERY DAY    furosemide  (LASIX ) 20 MG tablet TAKE ONE TABLET BY MOUTH DAILY AS NEEDED FOR EDEMA (VIAL)     gabapentin  (NEURONTIN ) 600 MG tablet TAKE ONE TABLET BY MOUTH DAILY AT 9AM and TAKE 1 AND 1/2 TABLETS BY MOUTH DAILY AT 9PM (VIAL)    levothyroxine  (SYNTHROID ) 25 MCG tablet TAKE ONE TABLET BY MOUTH DAILY AT 7AM BEFORE BREAKFAST AND 2-3 HOURS BEFORE TAKING ANY OTHER MEDICATION OR SUPPLEMENT    Magnesium  Citrate (MAGNESIUM  GUMMIES PO) Take 200 mg by mouth daily.    Multiple Vitamins-Minerals (MULTIVITAMIN GUMMIES ADULT) CHEW Chew 2 each by mouth daily.    nitroGLYCERIN  (NITROSTAT ) 0.4 MG SL tablet Place 1 tablet (0.4 mg total) under the tongue every 5 (five) minutes as needed for chest pain.    OLANZapine  (ZYPREXA ) 10 MG tablet TAKE 1 TABLET BY MOUTH EVERYDAY AT BEDTIME    ondansetron  (ZOFRAN -ODT) 4 MG disintegrating tablet TAKE 1 TABLET BY MOUTH EVERY 6 HOURS AS NEEDED FOR NAUSEA    pantoprazole  (PROTONIX ) 40 MG tablet TAKE 1 TABLET BY MOUTH EVERY DAY    potassium chloride  SA (KLOR-CON  M) 20 MEQ tablet TAKE ONE TABLET BY MOUTH DAILY AS NEEDED TAKE WITH LASIX  (VIAL) (Patient taking differently: 20 mEq as needed. TAKE ONE TABLET BY MOUTH DAILY AS NEEDED TAKE WITH LASIX  (VIAL))    Probiotic Product (PROBIOTIC BLEND) CAPS Take 1 capsule by mouth daily.    senna (SENOKOT) 8.6 MG tablet Take 1 tablet by mouth daily.    sucralfate  (CARAFATE ) 1 g tablet TAKE 1 TABLET (1 G TOTAL) BY MOUTH 3 TIMES A DAY BEFORE MEALS    traMADol  (ULTRAM ) 50 MG tablet Take 1 tablet (50 mg total) by mouth every 12 (twelve) hours as needed for moderate pain (pain score 4-6).    vitamin C (ASCORBIC ACID) 250 MG tablet Take 250 mg by mouth daily.    Zinc  Citrate (ZINC  GUMMY PO) Take 2 each by mouth daily.    [DISCONTINUED] rosuvastatin  (CRESTOR ) 40 MG tablet Take 1 tablet (40 mg total) by mouth at bedtime.    [DISCONTINUED] Semaglutide , 1 MG/DOSE, 4 MG/3ML SOPN Inject 1 mg as directed once a week. 04/06/2024: Recurrent orthostatic hypotensive episodes   [DISCONTINUED] Vibegron  (GEMTESA ) 75 MG TABS Take 1 tablet (75 mg total) by mouth  daily.    No facility-administered medications prior to visit.        Objective    BP 133/80 (BP Location: Left Arm, Patient Position: Sitting, Cuff Size: Normal)   Pulse 87   Ht 5' 6 (1.676 m)   Wt 184 lb 6.4 oz (83.6 kg)   SpO2 98%   BMI 29.76 kg/m     Physical Exam Vitals and nursing note reviewed.  Constitutional:      General: She is not in acute distress.    Appearance: Normal appearance.  HENT:     Head: Normocephalic and atraumatic.  Eyes:     General: No scleral icterus.    Conjunctiva/sclera: Conjunctivae normal.  Cardiovascular:     Rate and Rhythm: Normal rate.  Pulmonary:     Effort: Pulmonary effort is normal.  Neurological:     Mental Status: She is alert and oriented to person, place, and time. Mental status is at baseline.  Psychiatric:        Mood and Affect: Mood normal.        Behavior: Behavior normal.      No results found for any visits on 04/06/24.  Assessment & Plan    Hypertension associated with type 2 diabetes mellitus (HCC) -     Ozempic  (0.25 or 0.5 MG/DOSE); Inject 0.5 mg into the skin once a week.  Dispense: 3 mL; Refill: 2  Orthostatic hypotension  Near syncope  Chronic diastolic heart failure (HCC) Assessment & Plan: Currently scheduled for echocardiogram. Follows with cardiology; defer to specialist management.   Vision changes  Neurogenic claudication due to lumbar spinal stenosis  Aortic atherosclerosis (HCC)      Type 2 Diabetes management Blood sugars well-controlled. A1c at 5.7.  Onset of hypotensive symptoms seems to correspond with previous change in dose of Ozempic , which does have potential side effect of orthostatic hypotension.  Considering Ozempic  reduction due to current orthostatic hypotension. - Reduce Ozempic  dose to 0.5 mg. - Recheck A1c in 3 months.  Hypertension; orthostatic hypotension with dizziness and near syncope Orthostatic hypotension confirmed with significant blood pressure drop  upon standing. Symptoms likely exacerbated by Ozempic .  Suspect symptoms are likely of a cardiovascular etiology.  However, as patient is established with neurology, advised her to contact neurology for an appointment for further evaluation of alternative causes. - Increase salt intake, as recommended by cardiology. - Advise wearing thigh-high compression socks. - Reduce Ozempic  dose to 0.5 mg. - Follow up in 6 weeks to reassess symptoms and medication effects.  Heart failure Heart failure with fluctuating blood pressure. Management includes Jardiance . Blood pressure control essential to prevent symptom exacerbation.  Aortic atherosclerosis Noted on CT lumbar spine without contrast done 02/19/2024.  Managed through control of blood pressure, cholesterol, and blood sugars.  Continue to optimize risk factors.  Vision changes Reports of black spots and vision changes. - Schedule appointment with eye doctor after leaving clinic today.  Neurology follow-up; neurogenic claudication secondary to lumbar spinal stenosis Established with [facility] and followed for neurogenic claudication secondary to lumbar spinal stenosis . Advised patient to contact Dr. Mancel Blanch for follow-up regarding near syncope and further evaluation for potential autonomic response or alternative causes. - Contact [facility] for follow-up appointment.     Return in about 6 weeks (around 05/18/2024) for DM, syncope, Chronic f/u.      I discussed the assessment and treatment plan with the patient  The patient was provided an opportunity to ask questions and all were answered. The patient agreed with the plan and demonstrated an understanding of the instructions.   The patient was advised to call back or seek an in-person evaluation if the symptoms worsen or if the condition fails to improve as anticipated.    LAURAINE LOISE BUOY, DO  [facility] [phone] (phone) [phone]  (fax)  [facility]

## 2024-04-07 ENCOUNTER — Ambulatory Visit: Payer: Self-pay | Admitting: Physical Therapy

## 2024-04-08 ENCOUNTER — Other Ambulatory Visit: Payer: Self-pay | Admitting: Family Medicine

## 2024-04-08 ENCOUNTER — Other Ambulatory Visit: Payer: Self-pay | Admitting: Obstetrics

## 2024-04-08 ENCOUNTER — Ambulatory Visit
Admission: RE | Admit: 2024-04-08 | Discharge: 2024-04-08 | Disposition: A | Discharge status: Home / Self Care | Source: Ambulatory Visit | Attending: Neurosurgery | Admitting: Neurosurgery

## 2024-04-08 DIAGNOSIS — M5440 Lumbago with sciatica, unspecified side: Secondary | ICD-10-CM | POA: Diagnosis not present

## 2024-04-08 DIAGNOSIS — E1169 Type 2 diabetes mellitus with other specified complication: Secondary | ICD-10-CM

## 2024-04-08 DIAGNOSIS — M47816 Spondylosis without myelopathy or radiculopathy, lumbar region: Secondary | ICD-10-CM | POA: Diagnosis not present

## 2024-04-08 DIAGNOSIS — M48061 Spinal stenosis, lumbar region without neurogenic claudication: Secondary | ICD-10-CM | POA: Diagnosis not present

## 2024-04-08 DIAGNOSIS — N3946 Mixed incontinence: Secondary | ICD-10-CM

## 2024-04-08 DIAGNOSIS — E785 Hyperlipidemia, unspecified: Secondary | ICD-10-CM

## 2024-04-08 DIAGNOSIS — Z87440 Personal history of urinary (tract) infections: Secondary | ICD-10-CM

## 2024-04-08 DIAGNOSIS — M5136 Other intervertebral disc degeneration, lumbar region with discogenic back pain only: Secondary | ICD-10-CM | POA: Diagnosis not present

## 2024-04-08 MED ORDER — GADOBUTROL 1 MMOL/ML IV SOLN
8.0000 mL | Freq: Once | INTRAVENOUS | Status: AC | PRN
  Administered 2024-04-08: 8 mL via INTRAVENOUS

## 2024-04-11 ENCOUNTER — Ambulatory Visit: Payer: Self-pay | Admitting: Physical Therapy

## 2024-04-13 ENCOUNTER — Encounter: Payer: Self-pay | Admitting: Family Medicine

## 2024-04-13 ENCOUNTER — Ambulatory Visit: Payer: Self-pay | Admitting: Physical Therapy

## 2024-04-13 DIAGNOSIS — I700 Atherosclerosis of aorta: Secondary | ICD-10-CM | POA: Insufficient documentation

## 2024-04-13 DIAGNOSIS — M48062 Spinal stenosis, lumbar region with neurogenic claudication: Secondary | ICD-10-CM | POA: Insufficient documentation

## 2024-04-14 DIAGNOSIS — K582 Mixed irritable bowel syndrome: Secondary | ICD-10-CM | POA: Diagnosis not present

## 2024-04-14 DIAGNOSIS — M84372S Stress fracture, left ankle, sequela: Secondary | ICD-10-CM | POA: Diagnosis not present

## 2024-04-14 DIAGNOSIS — E119 Type 2 diabetes mellitus without complications: Secondary | ICD-10-CM | POA: Diagnosis not present

## 2024-04-14 DIAGNOSIS — E038 Other specified hypothyroidism: Secondary | ICD-10-CM | POA: Diagnosis not present

## 2024-04-14 DIAGNOSIS — E7849 Other hyperlipidemia: Secondary | ICD-10-CM | POA: Diagnosis not present

## 2024-04-14 DIAGNOSIS — K2100 Gastro-esophageal reflux disease with esophagitis, without bleeding: Secondary | ICD-10-CM | POA: Diagnosis not present

## 2024-04-14 DIAGNOSIS — F259 Schizoaffective disorder, unspecified: Secondary | ICD-10-CM | POA: Diagnosis not present

## 2024-04-15 DIAGNOSIS — K582 Mixed irritable bowel syndrome: Secondary | ICD-10-CM | POA: Diagnosis not present

## 2024-04-15 DIAGNOSIS — K2100 Gastro-esophageal reflux disease with esophagitis, without bleeding: Secondary | ICD-10-CM | POA: Diagnosis not present

## 2024-04-15 DIAGNOSIS — F259 Schizoaffective disorder, unspecified: Secondary | ICD-10-CM | POA: Diagnosis not present

## 2024-04-15 DIAGNOSIS — E7849 Other hyperlipidemia: Secondary | ICD-10-CM | POA: Diagnosis not present

## 2024-04-15 DIAGNOSIS — M84372S Stress fracture, left ankle, sequela: Secondary | ICD-10-CM | POA: Diagnosis not present

## 2024-04-15 DIAGNOSIS — E038 Other specified hypothyroidism: Secondary | ICD-10-CM | POA: Diagnosis not present

## 2024-04-23 DIAGNOSIS — N3000 Acute cystitis without hematuria: Secondary | ICD-10-CM | POA: Diagnosis not present

## 2024-04-25 ENCOUNTER — Encounter: Payer: Self-pay | Admitting: Dietician

## 2024-04-25 ENCOUNTER — Encounter: Attending: Family Medicine | Admitting: Dietician

## 2024-04-25 VITALS — Ht 66.0 in | Wt 179.3 lb

## 2024-04-25 DIAGNOSIS — E1169 Type 2 diabetes mellitus with other specified complication: Secondary | ICD-10-CM | POA: Diagnosis not present

## 2024-04-25 NOTE — Patient Instructions (Addendum)
 Eat a meal or snack every 4 hours during the day; set alarms if needed as reminder.  Priority is eating some protein food -- cheese, deli meat, other meats, peanut butter, yogurt, or a protein drink. Next eat some starch such as rice, potato, pasta/ noodles, bread, crackers, cereal. Include some vegetables as often as able.

## 2024-04-25 NOTE — Progress Notes (Signed)
 Diabetes Self-Management Education  Visit Type:  Follow-up  Appt. Start Time: 1330 Appt. End Time: 1425  04/25/2024  Ms. Valerie Vazquez, identified by name and date of birth, is a 68 y.o. female with a diagnosis of Diabetes:  .   ASSESSMENT  Height 5' 6 (1.676 m), weight 179 lb 4.8 oz (81.3 kg). Body mass index is 28.94 kg/m.    Diabetes Self-Management Education - 04/25/24 1337       Complications   How often do you check your blood sugar? 1-2 times/day    Fasting Blood glucose range (mg/dL) 29-870   26-880   Postprandial Blood glucose range (mg/dL) 29-870   one reading of 118, 03/17/24   Number of hypoglycemic episodes per month 2    Can you tell when your blood sugar is low? Yes    What do you do if your blood sugar is low? drinks juice    Have you had a dilated eye exam in the past 12 months? --   working with healthcare advocate on getting appt   Have you had a dental exam in the past 12 months? Yes    Are you checking your feet? Yes      Dietary Intake   Breakfast sometimes sausage biscuit (from frozen), fresh or canned fruit ie pineapple; bread with deli meat    Snack (morning) none recently    [employer] -- few bitres, mostly broth;    Snack (afternoon) none recently    [employer], salad    Snack (evening) usually none; currently has marbled brownie, eats a few bites at a time.    Beverage(s) water, juice, milk      Activity / Exercise   Activity / Exercise Type ADL's      Patient Education   Healthy Eating Meal timing in regards to the patients' current diabetes medication.;Meal options for control of blood glucose level and chronic complications.;Plate Method   managing poor appetite secondary to esophageal inflammation, spasms, back pain, possibly depression   Monitoring Taught/discussed recording of test results and interpretation of SMBG.          Learning Objective:  Patient will have a greater understanding of diabetes self-management. Patient  education plan is to attend individual and/or group sessions per assessed needs and concerns.   Plan:   Patient Instructions  Eat a meal or snack every 4 hours during the day; set alarms if needed as reminder.  Priority is eating some protein food -- cheese, deli meat, other meats, peanut butter, yogurt, or a protein drink. Next eat some starch such as rice, potato, pasta/ noodles, bread, crackers, cereal. Include some vegetables as often as able.    Expected Outcomes:  Demonstrated interest in learning. Expect positive outcomes  Education material provided:   If problems or questions, patient to contact team via:  Phone  Future DSME appointment: - 2 months 06/20/24

## 2024-04-26 DIAGNOSIS — M5440 Lumbago with sciatica, unspecified side: Secondary | ICD-10-CM | POA: Diagnosis not present

## 2024-04-27 ENCOUNTER — Other Ambulatory Visit: Payer: Self-pay | Admitting: Family Medicine

## 2024-04-27 DIAGNOSIS — G894 Chronic pain syndrome: Secondary | ICD-10-CM

## 2024-04-27 DIAGNOSIS — F251 Schizoaffective disorder, depressive type: Secondary | ICD-10-CM | POA: Diagnosis not present

## 2024-04-28 ENCOUNTER — Encounter: Payer: Self-pay | Admitting: Obstetrics

## 2024-04-28 ENCOUNTER — Ambulatory Visit (INDEPENDENT_AMBULATORY_CARE_PROVIDER_SITE_OTHER): Admitting: Obstetrics

## 2024-04-28 VITALS — BP 116/72 | HR 67

## 2024-04-28 DIAGNOSIS — K5900 Constipation, unspecified: Secondary | ICD-10-CM

## 2024-04-28 DIAGNOSIS — N368 Other specified disorders of urethra: Secondary | ICD-10-CM | POA: Diagnosis not present

## 2024-04-28 DIAGNOSIS — R55 Syncope and collapse: Secondary | ICD-10-CM | POA: Diagnosis not present

## 2024-04-28 DIAGNOSIS — Z87440 Personal history of urinary (tract) infections: Secondary | ICD-10-CM

## 2024-04-28 DIAGNOSIS — N3946 Mixed incontinence: Secondary | ICD-10-CM | POA: Diagnosis not present

## 2024-04-28 MED ORDER — GEMTESA 75 MG PO TABS: 1.0000 | ORAL_TABLET | Freq: Every day | ORAL | 5 refills | Status: DC

## 2024-04-28 NOTE — Patient Instructions (Addendum)
 Continues Gemtesa  1 tablet daily  Resume vaginal estrogen 1g 2-3x a week   Constipation: Our goal is to achieve formed bowel movements daily or every-other-day.  You may need to try different combinations of the following options to find what works best for you - everybody's body works differently so feel free to adjust the dosages as needed.  Some options to help maintain bowel health include:  Dietary changes (more leafy greens, vegetables and fruits; less processed foods) Fiber supplementation (Benefiber, FiberCon, Metamucil or Psyllium). Start slow and increase gradually to full dose. Over-the-counter agents such as: stool softeners (Docusate or Colace) and/or laxatives (Miralax , milk of magnesia)  Power Pudding is a natural mixture that may help your constipation.  To make blend 1 cup applesauce, 1 cup wheat bran, and 3/4 cup prune juice, refrigerate and then take 1 tablespoon daily with a large glass of water as needed.   Women should try to eat at least 21 to 25 grams of fiber a day, while men should aim for 30 to 38 grams a day. You can add fiber to your diet with food or a fiber supplement such as psyllium (metamucil), benefiber, or fibercon.   Here's a look at how much dietary fiber is found in some common foods. When buying packaged foods, check the Nutrition Facts label for fiber content. It can vary among brands.  Fruits Serving size Total fiber (grams)*  Raspberries 1 cup 8.0  Pear 1 medium 5.5  Apple, with skin 1 medium 4.5  Banana 1 medium 3.0  Orange 1 medium 3.0  Strawberries 1 cup 3.0   Vegetables Serving size Total fiber (grams)*  Green peas, boiled 1 cup 9.0  Broccoli, boiled 1 cup chopped 5.0  Turnip greens, boiled 1 cup 5.0  Brussels sprouts, boiled 1 cup 4.0  Potato, with skin, baked 1 medium 4.0  Sweet corn, boiled 1 cup 3.5  Cauliflower, raw 1 cup chopped 2.0  Carrot, raw 1 medium 1.5   Grains Serving size Total fiber (grams)*  Spaghetti, whole-wheat,  cooked 1 cup 6.0  Barley, pearled, cooked 1 cup 6.0  Bran flakes 3/4 cup 5.5  Quinoa, cooked 1 cup 5.0  Oat bran muffin 1 medium 5.0  Oatmeal, instant, cooked 1 cup 5.0  Popcorn, air-popped 3 cups 3.5  Brown rice, cooked 1 cup 3.5  Bread, whole-wheat 1 slice 2.0  Bread, rye 1 slice 2.0   Legumes, nuts and seeds Serving size Total fiber (grams)*  Split peas, boiled 1 cup 16.0  Lentils, boiled 1 cup 15.5  Black beans, boiled 1 cup 15.0  Baked beans, canned 1 cup 10.0  Chia seeds 1 ounce 10.0  Almonds 1 ounce (23 nuts) 3.5  Pistachios 1 ounce (49 nuts) 3.0  Sunflower kernels 1 ounce 3.0  *Rounded to nearest 0.5 gram. Source: [employer] for [employer], [employer]	[place]

## 2024-04-28 NOTE — Progress Notes (Signed)
 [facility] Return Visit  SUBJECTIVE  History of Present Illness: Valerie Vazquez is a 68 y.o. female seen in follow-up for mixed urinary incontinence, fecal incontinence, constipation, recurrent UTI, urethral prolapse, and nocturia. Plan at last visit was trial of Gemtesa , vaginal estrogen, optimize glycemic control, titration of miralax .   Gemtesa  nightly with resolution of urinary leakage Baseline UUI 3-4x/day and SUI 1x/month, managed by 3-4 pads/day Voids 5-8x/day, 1-2x/night now 0x/night Vaginal estrogen stopped around 2 weeks ago, reports reduction of vaginal lump since starting estrogen. Denies bleeding  Abnormal clear discharge started years ago managed by pad use with vaginal itching. Rx diflucan  for candida 01/22/24, reports reduction of itching but continues to report symptoms. Did not start titration of miralax , FI 2-3x/week down from 1x/day Denies CPAP use or sleep apnea diagnosis  Underwent pelvic floor PT, reports discontinuation due to orthostatic hypotension Prior use of linzess, desires to restart and pending return to Dr. Therisa (GI) in 05/23/24 Cr 1.01 in 03/31/24 Pending follow-up to consider back pain treatment such as repeat injection vs. L4-5 fusion due to pain from spondylolisthesis at L4-5 with chronic pain pain and LE paresthesia managed by Dr. Dow. S/p epidural injection 5/19, 7/12, and 11/22 in 2023  Past Medical History: Patient  has a past medical history of Acute right-sided low back pain with right-sided sciatica (10/23/2017), Allergy, Anemia, Anginal pain (HCC), Bilateral ovarian cysts, Bilateral renal cysts, Bulimia nervosa (05/07/2021), CAD (coronary artery disease) (09/28/2016), Cataract, Chest pain (10/14/2015), CHF (congestive heart failure) (HCC), Chronic back pain, Chronic shoulder pain (Location of Primary Source of Pain) (Left) (01/29/2016), Clotting disorder (HCC) (04/2001), Colitis (04/08/2016), Colon polyp (01/30/2017), COPD (chronic  obstructive pulmonary disease) (HCC), Coronary vasospasm (HCC) (12/09/2014), DDD (degenerative disc disease), cervical, Depression, DVT (deep venous thrombosis) (HCC), Esophageal spasm, Essential hypertension (10/28/2013), Gallstones, Gastritis, Gastroenteritis, GERD (gastroesophageal reflux disease), Heart disease (12/09/2016), High blood pressure, High cholesterol, History of blood transfusion (1985), History of left heart catheterization (LHC) (12/09/2014), History of nuclear stress test, History of pulmonary embolus (PE) (09/19/2014), cervical spine surgery (01/29/2016), Hypercementosis (02/13/2015), Hyperlipidemia (10/28/2013), Hypertension, Hypothyroidism, IBS (irritable bowel syndrome), Long term current use of anticoagulant, Lumbar central spinal stenosis (moderate at L4-5; mild at L2-3 and L3-4) (01/29/2016), Migraine, Myocardial infarction (HCC) (10/2010 X 3), Neck pain (01/29/2016), Osteoarthritis, Peripheral vascular disease (HCC), Pleural effusion, bilateral (07/31/2015), Pneumonia (04/2014), PONV (postoperative nausea and vomiting), PTSD (post-traumatic stress disorder), Pulmonary embolism (HCC) (04/2001; 09/19/2014), Schizoaffective disorder, depressive type (HCC) (08/28/2016), Sleep apnea, Snoring, SOB (shortness of breath) (07/31/2015), Spinal stenosis, Spondylosis, Suicidal ideation (08/27/2016), and Type II diabetes mellitus (HCC).   Past Surgical History: She  has a past surgical history that includes Excision/release bursa hip (Right, 12/1984); Total hip arthroplasty (Left, 04/06/2014); Tonsillectomy and adenoidectomy (~ 1968); Appendectomy (1985); Cholecystectomy (2002); Hernia repair (1985); Umbilical hernia repair (1985); Vena cava filter placement (03/2014); Back surgery; Anterior cervical decomp/discectomy fusion (10/2012); Breast cyst excision (Right); Cardiac catheterization ( 2000's; 2009); left heart catheterization with coronary angiogram (N/A, 12/09/2014); Colonoscopy with propofol   (N/A, 12/12/2016); Colonoscopy with propofol  (N/A, 02/17/2017); Esophagogastroduodenoscopy (egd) with propofol  (N/A, 02/17/2017); Esophagogastroduodenoscopy (egd) with propofol  (N/A, 03/07/2019); Breast excisional biopsy (Right, YRS AGO); Colonoscopy with propofol  (N/A, 11/04/2021); Robotic assisted bilateral salpingo oophorectomy (Bilateral, 05/15/2022); Cystoscopy (N/A, 05/15/2022); Partial hysterectomy (1985); IVC FILTER REMOVAL; Spine surgery (07/2023); Esophagogastroduodenoscopy (N/A, 03/02/2024); Abdominal hysterectomy (10/1983); and Joint replacement.   Medications: She has a current medication list which includes the following prescription(s): albuterol , apixaban , b complex vitamins, baclofen , blood glucose meter kit and supplies, ght  blood glucose monitor, dicyclomine , empagliflozin , escitalopram , estradiol , famotidine , ferrous sulfate , fluticasone , furosemide , gabapentin , levothyroxine , magnesium  citrate, multivitamin gummies adult, nitroglycerin , olanzapine , ondansetron , pantoprazole , potassium chloride  sa, probiotic blend, rosuvastatin , ozempic  (0.25 or 0.5 mg/dose), senna, sucralfate , tramadol , gemtesa , vitamin c, and zinc  citrate.   Allergies: Patient is allergic to other, abilify [aripiprazole], augmentin [amoxicillin-pot clavulanate], bactrim [sulfamethoxazole-trimethoprim], ceftin [cefuroxime axetil], clavulanic acid, coconut (cocos nucifera), diclofenac , indocin [indomethacin], linaclotide, lisinopril, morphine , morphine  and codeine , simvastatin, sulfa antibiotics, wellbutrin [bupropion], zetia  [ezetimibe ], and seroquel [quetiapine fumarate].   Social History: Patient  reports that she quit smoking about 45 years ago. Her smoking use included cigarettes. She started smoking about 55 years ago. She has a 15 pack-year smoking history. She has never used smokeless tobacco. She reports that she does not drink alcohol and does not use drugs.     OBJECTIVE     Physical Exam: Vitals:    04/28/24 1317  BP: 116/72  Pulse: 67   Physical Exam Constitutional:      General: She is not in acute distress.    Appearance: Normal appearance.  Genitourinary:     Bladder and urethral meatus normal.     Genitourinary Comments: Urethral prolapse 4-77mm with no erythema, bleeding, purulent discharge     Right Labia: No rash, tenderness, lesions, skin changes or Bartholin's cyst.    Left Labia: No tenderness, lesions, skin changes, Bartholin's cyst or rash.       No vaginal discharge or bleeding.     Urethral meatus caruncle not present.    Urethral prolapse present.     No urethral tenderness, mass, hypermobility, discharge or stress urinary incontinence with cough stress test present.     Bladder is not tender, urgency on palpation not present and masses not present.   Cardiovascular:     Rate and Rhythm: Normal rate.  Pulmonary:     Effort: Pulmonary effort is normal. No respiratory distress.  Abdominal:     General: There is no distension.     Palpations: There is no mass.     Tenderness: There is no abdominal tenderness.     Hernia: No hernia is present.  Neurological:     Mental Status: She is alert.  Vitals reviewed. Exam conducted with a chaperone present.        ASSESSMENT AND PLAN    Valerie Vazquez is a 68 y.o. with:  1. Constipation, unspecified constipation type   2. Mixed stress and urge urinary incontinence   3. History of recurrent UTI (urinary tract infection)   4. Urethral prolapse     Constipation, unspecified constipation type Assessment & Plan: - type IV stool with VI-VII stool after daily enema use - prior use of Linzess, pending follow-up with Dr. Therisa (GI) to reduce straining - lower abdominal pain and pelvic floor myofascial pain on exam, appts postponed due to ED evaluation for orthostatic hypotension. Encouraged to return after cardiology and neurology workup - For constipation, we reviewed the importance of a better bowel regimen.  We also  discussed the importance of avoiding chronic straining, as it can exacerbate her pelvic floor symptoms; we discussed treating constipation and straining prior to surgery, as postoperative straining can lead to damage to the repair and recurrence of symptoms. We discussed initiating therapy with increasing fluid intake, fiber supplementation, stool softeners, and laxatives such as miralax .  - provided samples to titrate miralax  until stool consistency prior to GI appt - discussed association of pelvic floor relaxation to improve slow transit constipation. Encouraged squatting  position for defecation   Mixed stress and urge urinary incontinence Assessment & Plan: - POCT catheterized urine + glucose only, HbA1C 5.7 on 01/21/24 - Cr 1.01 on 01/21/24 - started after back surgery per pt with history of L4-5 spinal stenosis, left leg numbness postpartum, left fibula fracture - urgency > stress - We discussed the symptoms of overactive bladder (OAB), which include urinary urgency, urinary frequency, nocturia, with or without urge incontinence.  While we do not know the exact etiology of OAB, several treatment options exist. We discussed management including behavioral therapy (decreasing bladder irritants, urge suppression strategies, timed voids, bladder retraining), physical therapy, medication; for refractory cases posterior tibial nerve stimulation, sacral neuromodulation, and intravesical botulinum toxin injection.  For anticholinergic medications, we discussed the potential side effects of anticholinergics including dry eyes, dry mouth, constipation, cognitive impairment and urinary retention. For Beta-3 agonist medication, we discussed the potential side effect of elevated blood pressure which is more likely to occur in individuals with uncontrolled hypertension. - samples and Rx for trial of gemtesa  - encouraged to maintain optimal glycemic control - encouraged consistent low dose vaginal estrogen  use - For treatment of stress urinary incontinence,  non-surgical options include expectant management, weight loss, physical therapy, as well as a pessary.  Surgical options include a midurethral sling, Burch urethropexy, and transurethral injection of a bulking agent.  Orders: -     Gemtesa ; Take 1 tablet (75 mg total) by mouth daily.  Dispense: 30 tablet; Refill: 5  History of recurrent UTI (urinary tract infection) Assessment & Plan: - For treatment of recurrent urinary tract infections, we discussed management of recurrent UTIs including prophylaxis with a daily low dose antibiotic, transvaginal estrogen therapy, D-mannose, and cranberry supplements.  We discussed the role of diagnostic testing such as cystoscopy and upper tract imaging.   - encouraged to resume low dose vaginal estrogen use - encouraged to optimize stool consistency to reduce FI - encouraged repeat testing if clinical change - continue probiotic use, reassured pt that she does not have to purchase expensive ones and encouraged to find ones that are cost effective  Orders: -     Gemtesa ; Take 1 tablet (75 mg total) by mouth daily.  Dispense: 30 tablet; Refill: 5  Urethral prolapse Assessment & Plan: - known urethral prolapse vs. Caruncle since 2022 - no pain with palpation or abnormal discharge - prior PVR 50mL without signs or symptoms of incomplete emptying - encouraged to resume low dose estrogen use due to reduce frequency of bulge symptoms   Time spent: I spent 23 minutes dedicated to the care of this patient on the date of this encounter to include pre-visit review of records, face-to-face time with the patient discussing urethral prolapse, mixed urinary incontinence, recurrent UTI, constipation, and post visit documentation and ordering medication/ testing.   Lianne ONEIDA Gillis, MD

## 2024-04-28 NOTE — Assessment & Plan Note (Signed)
-   type IV stool with VI-VII stool after daily enema use - prior use of Linzess, pending follow-up with Dr. Therisa (GI) to reduce straining - lower abdominal pain and pelvic floor myofascial pain on exam, appts postponed due to ED evaluation for orthostatic hypotension. Encouraged to return after cardiology and neurology workup - For constipation, we reviewed the importance of a better bowel regimen.  We also discussed the importance of avoiding chronic straining, as it can exacerbate her pelvic floor symptoms; we discussed treating constipation and straining prior to surgery, as postoperative straining can lead to damage to the repair and recurrence of symptoms. We discussed initiating therapy with increasing fluid intake, fiber supplementation, stool softeners, and laxatives such as miralax .  - provided samples to titrate miralax  until stool consistency prior to GI appt - discussed association of pelvic floor relaxation to improve slow transit constipation. Encouraged squatting position for defecation

## 2024-04-28 NOTE — Assessment & Plan Note (Signed)
-   POCT catheterized urine + glucose only, HbA1C 5.7 on 01/21/24 - Cr 1.01 on 01/21/24 - started after back surgery per pt with history of L4-5 spinal stenosis, left leg numbness postpartum, left fibula fracture - urgency > stress - We discussed the symptoms of overactive bladder (OAB), which include urinary urgency, urinary frequency, nocturia, with or without urge incontinence.  While we do not know the exact etiology of OAB, several treatment options exist. We discussed management including behavioral therapy (decreasing bladder irritants, urge suppression strategies, timed voids, bladder retraining), physical therapy, medication; for refractory cases posterior tibial nerve stimulation, sacral neuromodulation, and intravesical botulinum toxin injection.  For anticholinergic medications, we discussed the potential side effects of anticholinergics including dry eyes, dry mouth, constipation, cognitive impairment and urinary retention. For Beta-3 agonist medication, we discussed the potential side effect of elevated blood pressure which is more likely to occur in individuals with uncontrolled hypertension. - samples and Rx for trial of gemtesa  - encouraged to maintain optimal glycemic control - encouraged consistent low dose vaginal estrogen use - For treatment of stress urinary incontinence,  non-surgical options include expectant management, weight loss, physical therapy, as well as a pessary.  Surgical options include a midurethral sling, Burch urethropexy, and transurethral injection of a bulking agent.

## 2024-04-28 NOTE — Assessment & Plan Note (Addendum)
-   For treatment of recurrent urinary tract infections, we discussed management of recurrent UTIs including prophylaxis with a daily low dose antibiotic, transvaginal estrogen therapy, D-mannose, and cranberry supplements.  We discussed the role of diagnostic testing such as cystoscopy and upper tract imaging.   - encouraged to resume low dose vaginal estrogen use - encouraged to optimize stool consistency to reduce FI - encouraged repeat testing if clinical change - continue probiotic use, reassured pt that she does not have to purchase expensive ones and encouraged to find ones that are cost effective

## 2024-04-28 NOTE — Assessment & Plan Note (Addendum)
-   known urethral prolapse vs. Caruncle since 2022 - no pain with palpation or abnormal discharge - prior PVR 50mL without signs or symptoms of incomplete emptying - encouraged to resume low dose estrogen use due to reduce frequency of bulge symptoms

## 2024-05-02 DIAGNOSIS — E039 Hypothyroidism, unspecified: Secondary | ICD-10-CM | POA: Diagnosis not present

## 2024-05-02 DIAGNOSIS — E119 Type 2 diabetes mellitus without complications: Secondary | ICD-10-CM | POA: Diagnosis not present

## 2024-05-03 ENCOUNTER — Ambulatory Visit (INDEPENDENT_AMBULATORY_CARE_PROVIDER_SITE_OTHER)

## 2024-05-03 ENCOUNTER — Ambulatory Visit: Attending: Medical

## 2024-05-03 DIAGNOSIS — R55 Syncope and collapse: Secondary | ICD-10-CM

## 2024-05-03 LAB — ECHOCARDIOGRAM COMPLETE
AR max vel: 1.89 cm2
AV Area VTI: 1.95 cm2
AV Area mean vel: 2.01 cm2
AV Mean grad: 4 mmHg
AV Peak grad: 7.7 mmHg
Ao pk vel: 1.39 m/s
Area-P 1/2: 2.4 cm2
S' Lateral: 3 cm

## 2024-05-05 DIAGNOSIS — M5416 Radiculopathy, lumbar region: Secondary | ICD-10-CM | POA: Diagnosis not present

## 2024-05-05 DIAGNOSIS — R55 Syncope and collapse: Secondary | ICD-10-CM

## 2024-05-09 DIAGNOSIS — E113291 Type 2 diabetes mellitus with mild nonproliferative diabetic retinopathy without macular edema, right eye: Secondary | ICD-10-CM | POA: Diagnosis not present

## 2024-05-09 DIAGNOSIS — H2513 Age-related nuclear cataract, bilateral: Secondary | ICD-10-CM | POA: Diagnosis not present

## 2024-05-09 DIAGNOSIS — H5211 Myopia, right eye: Secondary | ICD-10-CM | POA: Diagnosis not present

## 2024-05-09 DIAGNOSIS — H5202 Hypermetropia, left eye: Secondary | ICD-10-CM | POA: Diagnosis not present

## 2024-05-09 DIAGNOSIS — H04123 Dry eye syndrome of bilateral lacrimal glands: Secondary | ICD-10-CM | POA: Diagnosis not present

## 2024-05-09 LAB — HM DIABETES EYE EXAM

## 2024-05-12 ENCOUNTER — Ambulatory Visit: Attending: Medical | Admitting: Medical

## 2024-05-12 ENCOUNTER — Encounter: Payer: Self-pay | Admitting: Medical

## 2024-05-12 VITALS — BP 120/76 | HR 86 | Ht 66.0 in | Wt 181.4 lb

## 2024-05-12 DIAGNOSIS — I38 Endocarditis, valve unspecified: Secondary | ICD-10-CM

## 2024-05-12 DIAGNOSIS — I201 Angina pectoris with documented spasm: Secondary | ICD-10-CM | POA: Diagnosis not present

## 2024-05-12 DIAGNOSIS — E782 Mixed hyperlipidemia: Secondary | ICD-10-CM

## 2024-05-12 DIAGNOSIS — I10 Essential (primary) hypertension: Secondary | ICD-10-CM

## 2024-05-12 DIAGNOSIS — R55 Syncope and collapse: Secondary | ICD-10-CM | POA: Diagnosis not present

## 2024-05-12 DIAGNOSIS — I951 Orthostatic hypotension: Secondary | ICD-10-CM
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
## 2024-05-12 NOTE — Patient Instructions (Signed)
 Medication Instructions:  Your physician recommends that you continue on your current medications as directed. Please refer to the Current Medication list given to you today.   *If you need a refill on your cardiac medications before your next appointment, please call your pharmacy*  Lab Work: No labs ordered today   Testing/Procedures: No test ordered today   Follow-Up: At [facility], you and your health needs are our priority.  As part of our continuing mission to provide you with exceptional heart care, our providers are all part of one team.  This team includes your primary Cardiologist (physician) and Advanced Practice Providers or APPs (Physician Assistants and Nurse Practitioners) who all work together to provide you with the care you need, when you need it.  Your next appointment:   6 month(s)  Provider:   You may see Constancia Delton, MD or one of the following Advanced Practice Providers on your designated Care Team:   Laneta Pintos, NP Gildardo Labrador, PA-C Varney Gentleman, PA-C Cadence Northvale, PA-C Ronald Cockayne, NP Morey Ar, NP

## 2024-05-13 ENCOUNTER — Encounter: Payer: Self-pay | Admitting: Family Medicine

## 2024-05-16 ENCOUNTER — Encounter: Payer: Self-pay | Admitting: Family Medicine

## 2024-05-16 DIAGNOSIS — E11319 Type 2 diabetes mellitus with unspecified diabetic retinopathy without macular edema: Secondary | ICD-10-CM | POA: Insufficient documentation

## 2024-05-18 ENCOUNTER — Encounter: Payer: Self-pay | Admitting: Family Medicine

## 2024-05-18 ENCOUNTER — Ambulatory Visit (INDEPENDENT_AMBULATORY_CARE_PROVIDER_SITE_OTHER): Admitting: Family Medicine

## 2024-05-18 VITALS — BP 136/78 | HR 71 | Ht 66.0 in | Wt 186.0 lb

## 2024-05-18 DIAGNOSIS — J410 Simple chronic bronchitis: Secondary | ICD-10-CM

## 2024-05-18 DIAGNOSIS — E11319 Type 2 diabetes mellitus with unspecified diabetic retinopathy without macular edema: Secondary | ICD-10-CM

## 2024-05-18 DIAGNOSIS — I201 Angina pectoris with documented spasm: Secondary | ICD-10-CM | POA: Diagnosis not present

## 2024-05-18 DIAGNOSIS — R7401 Elevation of levels of liver transaminase levels: Secondary | ICD-10-CM

## 2024-05-18 DIAGNOSIS — Z77090 Contact with and (suspected) exposure to asbestos: Secondary | ICD-10-CM

## 2024-05-18 DIAGNOSIS — K2100 Gastro-esophageal reflux disease with esophagitis, without bleeding: Secondary | ICD-10-CM | POA: Diagnosis not present

## 2024-05-18 DIAGNOSIS — E1169 Type 2 diabetes mellitus with other specified complication: Secondary | ICD-10-CM | POA: Diagnosis not present

## 2024-05-18 DIAGNOSIS — R918 Other nonspecific abnormal finding of lung field: Secondary | ICD-10-CM

## 2024-05-18 DIAGNOSIS — R7989 Other specified abnormal findings of blood chemistry: Secondary | ICD-10-CM

## 2024-05-18 NOTE — Patient Instructions (Signed)
 Recommended vaccine (if not received in past ten years): Tdap (tetanus, diphtheria and pertussis).

## 2024-05-18 NOTE — Progress Notes (Signed)
 Established patient visit   Patient: Valerie Vazquez   DOB: 11-19-1955   68 y.o. Female  MRN: [id number] Visit Date: 05/18/2024  Today's healthcare provider: LAURAINE LOISE BUOY, DO   Chief Complaint  Patient presents with   Medical Management of Chronic Issues   Subjective    HPI Valerie Vazquez is a 68 year old female with COPD and GERD who presents with concerns about her lung health and esophageal inflammation.  She is concerned about chronic blunting of the right costophrenic angle, first noted on July 20th. She has a history of bilateral pleural effusion and questions if the blunting is related to asbestos exposure in 1994. She experiences shortness of breath and uses inhalers, including albuterol , though not frequently. She quit smoking in 1980 after smoking intermittently from age 70 to 44.  She experiences chest pain that is sometimes dull and sometimes sharp, occurring deep inside and without a specific pattern. She also reports numbness in her toes and occasional muscle cramps in her legs and feet.  She has a history of esophageal inflammation, diagnosed via biopsy in July, compatible with GERD. She is taking sucralfate  and Protonix  for management. She has a long history of esophageal, stomach, and intestinal spasms, with extensive testing done in the past, including a barium swallow study.  She is concerned about her liver health, having previously stopped taking generic Tylenol  due to elevated AST and ALT levels noted on July 21st. She is currently taking tramadol  one every twelve hours or as needed for pain management.  She has concerns about her right eye, diagnosed with background diabetic retinopathy.  She has switched from taking B12 to a B complex due to previously high B12 levels.    Asbestos exposure - 1994 for a couple months - worked for [employer] - walls torn down to get to faulty plumbing in old building. Worked as part of Administrator, sports, cleaning  dust off furniture. Did wear mask but frequently pulled it on and off.      Medications: Outpatient Medications Prior to Visit  Medication Sig   albuterol  (VENTOLIN  HFA) 108 (90 Base) MCG/ACT inhaler TAKE 2 PUFFS BY MOUTH EVERY 6 HOURS AS NEEDED FOR WHEEZE OR SHORTNESS OF BREATH   apixaban  (ELIQUIS ) 5 MG TABS tablet Take 1 tablet (5 mg total) by mouth 2 (two) times daily.   b complex vitamins capsule Take 1 capsule by mouth daily.   baclofen  (LIORESAL ) 10 MG tablet TAKE ONE TABLET BY MOUTH THREE TIMES DAILY AS NEEDED FOR MUSCLE SPASMS (VIAL)   blood glucose meter kit and supplies KIT Dispense based on patient and insurance preference. Use once daily fasting. Dx code E11.9.   Blood Glucose Monitoring Suppl (GHT BLOOD GLUCOSE MONITOR) w/Device KIT DISPENSE BASED ON PATIENT AND INSURANCE PREFERENCE. USE ONCE DAILY AS DIRECTED. DX CODE E11.9.   dicyclomine  (BENTYL ) 20 MG tablet TAKE ONE TABLET BY MOUTH THREE TIMES DAILY AS NEEDED FOR ABDOMINAL SPASMS (VIAL)   empagliflozin  (JARDIANCE ) 10 MG TABS tablet Take 1 tablet (10 mg total) by mouth daily before breakfast.   escitalopram  (LEXAPRO ) 20 MG tablet TAKE ONE TABLET BY MOUTH DAILY AT 9AM   estradiol  (ESTRACE ) 0.1 MG/GM vaginal cream Place 0.5 g vaginally 2 (two) times a week. Place 0.5g nightly for two weeks then twice a week after   famotidine  (PEPCID ) 20 MG tablet TAKE 1 TABLET BY MOUTH EVERYDAY AT BEDTIME   ferrous sulfate  325 (65 FE) MG tablet Take 325 mg  by mouth daily with breakfast.   fluticasone  (FLONASE ) 50 MCG/ACT nasal spray SPRAY 2 SPRAYS INTO EACH NOSTRIL EVERY DAY   furosemide  (LASIX ) 20 MG tablet TAKE ONE TABLET BY MOUTH DAILY AS NEEDED FOR EDEMA (VIAL)   gabapentin  (NEURONTIN ) 600 MG tablet TAKE ONE TABLET BY MOUTH DAILY AT 9AM and TAKE 1 AND 1/2 TABLETS BY MOUTH DAILY AT 9PM (VIAL)   levothyroxine  (SYNTHROID ) 25 MCG tablet TAKE ONE TABLET BY MOUTH DAILY AT 7AM BEFORE BREAKFAST AND 2-3 HOURS BEFORE TAKING ANY OTHER MEDICATION OR  SUPPLEMENT   Magnesium  Citrate (MAGNESIUM  GUMMIES PO) Take 200 mg by mouth daily.   Multiple Vitamins-Minerals (MULTIVITAMIN GUMMIES ADULT) CHEW Chew 2 each by mouth daily.   nitroGLYCERIN  (NITROSTAT ) 0.4 MG SL tablet Place 1 tablet (0.4 mg total) under the tongue every 5 (five) minutes as needed for chest pain.   OLANZapine  (ZYPREXA ) 10 MG tablet TAKE 1 TABLET BY MOUTH EVERYDAY AT BEDTIME   ondansetron  (ZOFRAN -ODT) 4 MG disintegrating tablet TAKE 1 TABLET BY MOUTH EVERY 6 HOURS AS NEEDED FOR NAUSEA   pantoprazole  (PROTONIX ) 40 MG tablet TAKE 1 TABLET BY MOUTH EVERY DAY   potassium chloride  SA (KLOR-CON  M) 20 MEQ tablet TAKE ONE TABLET BY MOUTH DAILY AS NEEDED TAKE WITH LASIX  (VIAL) (Patient taking differently: 20 mEq as needed. TAKE ONE TABLET BY MOUTH DAILY AS NEEDED TAKE WITH LASIX  (VIAL))   Probiotic Product (PROBIOTIC BLEND) CAPS Take 1 capsule by mouth daily.   rosuvastatin  (CRESTOR ) 40 MG tablet TAKE 1 TABLET BY MOUTH EVERYDAY AT BEDTIME   Semaglutide ,0.25 or 0.5MG /DOS, (OZEMPIC , 0.25 OR 0.5 MG/DOSE,) 2 MG/3ML SOPN Inject 0.5 mg into the skin once a week.   senna (SENOKOT) 8.6 MG tablet Take 1 tablet by mouth daily.   sucralfate  (CARAFATE ) 1 g tablet TAKE 1 TABLET (1 G TOTAL) BY MOUTH 3 TIMES A DAY BEFORE MEALS   traMADol  (ULTRAM ) 50 MG tablet Take 1 tablet (50 mg total) by mouth every 12 (twelve) hours as needed for moderate pain (pain score 4-6).   Vibegron  (GEMTESA ) 75 MG TABS Take 1 tablet (75 mg total) by mouth daily.   vitamin C (ASCORBIC ACID) 250 MG tablet Take 250 mg by mouth daily.   Zinc  Citrate (ZINC  GUMMY PO) Take 2 each by mouth daily.   No facility-administered medications prior to visit.    Review of Systems  Constitutional:  Negative for appetite change, chills, fatigue and fever.  Respiratory:  Positive for shortness of breath. Negative for chest tightness.   Cardiovascular:  Positive for chest pain (intermittent). Negative for palpitations.  Gastrointestinal:   Negative for abdominal pain, nausea and vomiting.  Neurological:  Negative for dizziness and weakness.        Objective    BP 136/78 (BP Location: Left Arm, Patient Position: Sitting, Cuff Size: Large)   Pulse 71   Ht 5' 6 (1.676 m)   Wt 186 lb (84.4 kg)   SpO2 98%   BMI 30.02 kg/m     Physical Exam Vitals and nursing note reviewed.  Constitutional:      General: She is not in acute distress.    Appearance: Normal appearance.  HENT:     Head: Normocephalic and atraumatic.  Eyes:     General: No scleral icterus.    Conjunctiva/sclera: Conjunctivae normal.  Cardiovascular:     Rate and Rhythm: Normal rate.  Pulmonary:     Effort: Pulmonary effort is normal.  Neurological:     Mental Status: She is alert  and oriented to person, place, and time. Mental status is at baseline.  Psychiatric:        Mood and Affect: Mood normal.        Behavior: Behavior normal.      Results for orders placed or performed in visit on 05/18/24  Comprehensive metabolic panel with GFR  Result Value Ref Range   Glucose 78 70 - 99 mg/dL   BUN 5 (L) 8 - 27 mg/dL   Creatinine, Ser 9.20 0.57 - 1.00 mg/dL   eGFR 80 >40 fO/fpw/8.26   BUN/Creatinine Ratio 6 (L) 12 - 28   Sodium 144 134 - 144 mmol/L   Potassium 3.7 3.5 - 5.2 mmol/L   Chloride 110 (H) 96 - 106 mmol/L   CO2 21 20 - 29 mmol/L   Calcium  9.0 8.7 - 10.3 mg/dL   Total Protein 6.3 6.0 - 8.5 g/dL   Albumin 3.9 3.9 - 4.9 g/dL   Globulin, Total 2.4 1.5 - 4.5 g/dL   Bilirubin Total 0.3 0.0 - 1.2 mg/dL   Alkaline Phosphatase 118 49 - 135 IU/L   AST 32 0 - 40 IU/L   ALT 40 (H) 0 - 32 IU/L  Vitamin B12  Result Value Ref Range   Vitamin B-12 734 232 - 1,245 pg/mL  Hemoglobin A1c  Result Value Ref Range   Hgb A1c MFr Bld 5.8 (H) 4.8 - 5.6 %   Est. average glucose Bld gHb Est-mCnc 120 mg/dL    Assessment & Plan    Type 2 diabetes mellitus with other specified complication, without long-term current use of insulin  (HCC) -      Hemoglobin A1c  Diabetic retinopathy of right eye associated with type 2 diabetes mellitus, macular edema presence unspecified, unspecified retinopathy severity (HCC)  Transaminitis -     Comprehensive metabolic panel with GFR  Coronary vasospasm (HCC)  Elevated vitamin B12 level -     Vitamin B12  Simple chronic bronchitis (HCC) -     Pulmonary Visit  Asbestos exposure -     Pulmonary Visit  Gastroesophageal reflux disease with esophagitis without hemorrhage  Blunting of right costophrenic angle on chest x-ray      Type 2 diabetes mellitus with other specified complication (background diabetic retinopathy, right eye) Background diabetic retinopathy in the right eye, mild, no treatment needed (per ophthalmology note). Emphasized controlling blood sugar and blood pressure to prevent progression. - Emphasize blood sugar and blood pressure control to prevent progression of retinopathy.  Transaminitis Previous elevation of AST and ALT noted, levels decreasing. Stopped Tylenol  due to liver concerns. Current medication includes tramadol  for pain management. - Recheck liver enzyme levels.  Coronary vasospasm Previously on Imdur  but it is currently held due to and syncope.  Follows with cardiology; defer to specialist management.  Chronic obstructive pulmonary disease/simple chronic bronchitis; asbestos exposure COPD with chronic bronchitis, not emphysema. Previous smoking history, quit in 1980. Uses albuterol  inhaler as needed, infrequently. - Continue albuterol  inhaler as needed.  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 History of asbestos exposure and history of bilateral pleural effusions, no significant pleural effusion currently. - Refer to pulmonology for further evaluation due to concerns about lung changes and asbestos exposure.  Gastroesophageal reflux disease with esophagitis without  hemorrhage Esophagitis confirmed via biopsy, compatible with GERD. Currently taking sucralfate  and Protonix . Advised dietary modifications to reduce reflux symptoms. - Continue sucralfate  and Protonix . - Implement dietary modifications to reduce acidic foods.  Return in about 3 months (around 08/17/2024) for DM, Chronic f/u.      I discussed the assessment and treatment plan with the patient  The patient was provided an opportunity to ask questions and all were answered. The patient agreed with the plan and demonstrated an understanding of the instructions.   The patient was advised to call back or seek an in-person evaluation if the symptoms worsen or if the condition fails to improve as anticipated.    LAURAINE LOISE BUOY, DO  [facility] [phone] (phone) [phone] (fax)  [facility]

## 2024-05-19 ENCOUNTER — Telehealth: Payer: Self-pay

## 2024-05-19 DIAGNOSIS — Z77090 Contact with and (suspected) exposure to asbestos: Secondary | ICD-10-CM

## 2024-05-19 DIAGNOSIS — J410 Simple chronic bronchitis: Secondary | ICD-10-CM

## 2024-05-19 DIAGNOSIS — M5440 Lumbago with sciatica, unspecified side: Secondary | ICD-10-CM | POA: Diagnosis not present

## 2024-05-19 LAB — COMPREHENSIVE METABOLIC PANEL WITH GFR
ALT: 40 IU/L — ABNORMAL HIGH (ref 0–32)
AST: 32 IU/L (ref 0–40)
Albumin: 3.9 g/dL (ref 3.9–4.9)
Alkaline Phosphatase: 118 IU/L (ref 49–135)
BUN/Creatinine Ratio: 6 — ABNORMAL LOW (ref 12–28)
BUN: 5 mg/dL — ABNORMAL LOW (ref 8–27)
Bilirubin Total: 0.3 mg/dL (ref 0.0–1.2)
CO2: 21 mmol/L (ref 20–29)
Calcium: 9 mg/dL (ref 8.7–10.3)
Chloride: 110 mmol/L — ABNORMAL HIGH (ref 96–106)
Creatinine, Ser: 0.79 mg/dL (ref 0.57–1.00)
Globulin, Total: 2.4 g/dL (ref 1.5–4.5)
Glucose: 78 mg/dL (ref 70–99)
Potassium: 3.7 mmol/L (ref 3.5–5.2)
Sodium: 144 mmol/L (ref 134–144)
Total Protein: 6.3 g/dL (ref 6.0–8.5)
eGFR: 80 mL/min/1.73 (ref >59)

## 2024-05-19 LAB — VITAMIN B12: Vitamin B-12: 734 pg/mL (ref 232–1245)

## 2024-05-19 LAB — HEMOGLOBIN A1C
Est. average glucose Bld gHb Est-mCnc: 120 mg/dL
Hgb A1c MFr Bld: 5.8 % — ABNORMAL HIGH (ref 4.8–5.6)

## 2024-05-19 NOTE — Telephone Encounter (Signed)
 Copied from CRM [id number]. Topic: Clinical - Medical Advice >> May 19, 2024 10:54 AM Winona R wrote: Pt would like to know if she can have a Lung screening before seeing her pulmonologist as her appointment in later in October

## 2024-05-20 NOTE — Telephone Encounter (Unsigned)
 Copied from CRM [id number]. Topic: Clinical - Medical Advice >> May 19, 2024 10:54 AM Winona R wrote: Pt would like to know if she can have a Lung screening before seeing her pulmonologist as her appointment in later in October >> May 20, 2024 10:56 AM Delon T wrote: Calling for follow up

## 2024-05-20 NOTE — Addendum Note (Signed)
 Addended byBETHA DONZELLA DOMINO on: 05/20/2024 01:07 PM   Modules accepted: Orders

## 2024-05-23 ENCOUNTER — Ambulatory Visit: Payer: Self-pay | Admitting: Family Medicine

## 2024-05-23 ENCOUNTER — Telehealth: Payer: Self-pay

## 2024-05-23 DIAGNOSIS — R1310 Dysphagia, unspecified: Secondary | ICD-10-CM | POA: Diagnosis not present

## 2024-05-23 DIAGNOSIS — R140 Abdominal distension (gaseous): Secondary | ICD-10-CM | POA: Diagnosis not present

## 2024-05-23 DIAGNOSIS — K5900 Constipation, unspecified: Secondary | ICD-10-CM | POA: Diagnosis not present

## 2024-05-23 NOTE — Telephone Encounter (Signed)
 Copied from CRM [id number]. Topic: Clinical - Request for Lab/Test Order >> May 20, 2024  3:30 PM Abigail D wrote: Reason for CRM: Patient is scheduled for a new patient visit on 10/2 and would like to get a lung cancer as soon as possible - She is a bit on edge and would like to know if that's something she can do prior to her visit. >> May 20, 2024  3:34 PM Lavanda D wrote: Asbestos exposure and previous smoker, she wanted to advise.

## 2024-05-23 NOTE — Telephone Encounter (Signed)
 Patient advised.

## 2024-05-23 NOTE — Telephone Encounter (Signed)
 I spoke with the patient. She is asking for a CT Chest. I told her Dr. Tamea would order that at her appt on 10/2 if she feels it is neccessary.  Nothing further needed.

## 2024-05-23 NOTE — Telephone Encounter (Signed)
 Copied from CRM #[id number]. Topic: General - Other >> May 20, 2024  3:52 PM [name] wrote: Reason for CRM: patient calling back and read her the note from Dr Donzella and what are the next steps with the pulmonary function test   patient has an appointment with lung doctor 06/02/24 and she wanted to inform Dr Donzella   Pt num [phone] Valerie Vazquez)

## 2024-05-24 ENCOUNTER — Telehealth: Payer: Self-pay | Admitting: Cardiology

## 2024-05-24 DIAGNOSIS — N3000 Acute cystitis without hematuria: Secondary | ICD-10-CM | POA: Diagnosis not present

## 2024-05-24 NOTE — Telephone Encounter (Signed)
   Patient Name: Valerie Vazquez  DOB: 06-Jun-1956 MRN: [id number]  Primary Cardiologist: Redell Cave, MD  Chart reviewed as part of pre-operative protocol coverage. Patient is scheduled for EGD on 06/08/2024 and Cardiology was asked to give recommendations for holding Eliquis . Patient is on Eliquis  for prior DVT/ PE not for a cardiac reason. Therefore, will defer recommendations for holding this to PCP or prescribing provider.   I will route this recommendation to the requesting party and remove from pre-op pool.  Please call with questions.  Tephanie Escorcia E Laylynn Campanella, PA-C 05/24/2024, 11:31 AM

## 2024-05-24 NOTE — Telephone Encounter (Signed)
   Pre-operative Risk Assessment    Patient Name: Valerie Vazquez  DOB: October 17, 1955 MRN: [id number]   Date of last office visit: 05/12/24 Date of next office visit: n/a   Request for Surgical Clearance    Procedure:  EGD at [facility]  Date of Surgery:  Clearance 06/08/24                                Surgeon:  Dr Therisa Socks Group or Practice Name:  [facility] Phone number:  [phone] Fax number:  [phone]   Type of Clearance Requested:   - Pharmacy:  Hold Apixaban  (Eliquis ) intstructions   Type of Anesthesia:  General    Additional requests/questions:    [name]   05/24/2024, 11:05 AM

## 2024-05-25 ENCOUNTER — Ambulatory Visit: Admitting: Family Medicine

## 2024-05-25 ENCOUNTER — Other Ambulatory Visit: Payer: Self-pay | Admitting: Family Medicine

## 2024-05-25 DIAGNOSIS — J410 Simple chronic bronchitis: Secondary | ICD-10-CM

## 2024-05-25 DIAGNOSIS — Z77090 Contact with and (suspected) exposure to asbestos: Secondary | ICD-10-CM

## 2024-05-31 DIAGNOSIS — E039 Hypothyroidism, unspecified: Secondary | ICD-10-CM | POA: Diagnosis not present

## 2024-05-31 DIAGNOSIS — E119 Type 2 diabetes mellitus without complications: Secondary | ICD-10-CM | POA: Diagnosis not present

## 2024-06-01 ENCOUNTER — Telehealth: Payer: Self-pay

## 2024-06-01 DIAGNOSIS — M47816 Spondylosis without myelopathy or radiculopathy, lumbar region: Secondary | ICD-10-CM | POA: Diagnosis not present

## 2024-06-01 DIAGNOSIS — E119 Type 2 diabetes mellitus without complications: Secondary | ICD-10-CM | POA: Diagnosis not present

## 2024-06-01 DIAGNOSIS — E039 Hypothyroidism, unspecified: Secondary | ICD-10-CM | POA: Diagnosis not present

## 2024-06-01 NOTE — Telephone Encounter (Signed)
 I s/w the GI office and explained that we have faxed notes x 2 to their office that our office does not manage her blood thinner, see notes.   GI office will call the pt an explain they did reach out to the PCP for blood thinner recommendations.

## 2024-06-01 NOTE — Telephone Encounter (Signed)
 I will re-fax notes from Jon Hails, [facility]

## 2024-06-01 NOTE — Telephone Encounter (Signed)
 [facility] calling for a update on pts clearance.

## 2024-06-01 NOTE — Telephone Encounter (Signed)
 Copied from CRM #[id number]. Topic: Clinical - Medication Question >> Jun 01, 2024  3:59 PM Willma R wrote: Reason for CRM: Chiquita from [place] [facility] calling to inform a fax is being sent for the patient to get cardiac clearance, patient needs to be off her apixaban  (ELIQUIS ) 5 MG TABS tablet and needs approval from Dr Donzella by Friday 10/3 or her procedure will need to be rescheduled.  Chiquita can be reached at [phone]

## 2024-06-01 NOTE — Telephone Encounter (Signed)
 Patient called to follow-up on the status of her clearance.  Patient noted her surgeon's office has not received the clearance as yet and they will need to get the clearance by Friday, 10/3.

## 2024-06-02 ENCOUNTER — Encounter: Payer: Self-pay | Admitting: Pulmonary Disease

## 2024-06-02 ENCOUNTER — Ambulatory Visit: Admitting: Pulmonary Disease

## 2024-06-02 VITALS — BP 140/84 | HR 76 | Temp 97.6°F | Ht 66.0 in | Wt 186.4 lb

## 2024-06-02 DIAGNOSIS — R0602 Shortness of breath: Secondary | ICD-10-CM | POA: Diagnosis not present

## 2024-06-02 DIAGNOSIS — J309 Allergic rhinitis, unspecified: Secondary | ICD-10-CM

## 2024-06-02 DIAGNOSIS — Z2821 Immunization not carried out because of patient refusal: Secondary | ICD-10-CM

## 2024-06-02 DIAGNOSIS — J449 Chronic obstructive pulmonary disease, unspecified: Secondary | ICD-10-CM

## 2024-06-02 DIAGNOSIS — Z77090 Contact with and (suspected) exposure to asbestos: Secondary | ICD-10-CM

## 2024-06-02 DIAGNOSIS — I5032 Chronic diastolic (congestive) heart failure: Secondary | ICD-10-CM | POA: Diagnosis not present

## 2024-06-02 NOTE — Patient Instructions (Signed)
 VISIT SUMMARY:  Today, we discussed your persistent shortness of breath and reviewed your history of COPD, pleural effusions, and asbestos exposure. We also talked about your allergies and current medications.  YOUR PLAN:  -CHRONIC OBSTRUCTIVE PULMONARY DISEASE (COPD): COPD is a chronic lung disease that makes it hard to breathe. We will order pulmonary function tests to assess your lung function and guide your inhaler therapy. Based on the results, we will determine the appropriate maintenance inhaler for you. Please follow up in 3-4 weeks to review the test results and adjust your treatment plan.  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 No new treatment is needed at this time, but we will continue to monitor your condition.  Pleural effusions have resolved.  -PULMONARY MONITORING FOR ASBESTOS EXPOSURE: Due to your history of asbestos exposure, we need to monitor your lung health. We will order a high-resolution CT scan to check for any asbestos-related lung changes and use this scan as a baseline for future monitoring.  -ALLERGIC RHINITIS: Allergic rhinitis is an allergic reaction that causes sneezing, congestion, and a runny nose. You are currently managing this with Flonase , and no changes are needed at this time.  INSTRUCTIONS:  Please follow up in 3-4 weeks to review the results of your pulmonary function tests and adjust your treatment plan accordingly.  You declined flu vaccine today.

## 2024-06-02 NOTE — Progress Notes (Signed)
 Subjective:    Patient ID: Valerie Vazquez, female    DOB: [birth year], 68 y.o.   MRN: [id number]  Patient Care Team: [name], DO as PCP - General (Family Medicine) Darliss Rogue, MD as PCP - Cardiology (Cardiology) Pa, [facility])  Chief Complaint  Patient presents with   Consult    Shortness of breath on exertion and occasional at rest. Occasional cough. Patient reports hx of mild COPD.    BACKGROUND: Patient is a 68 year old former smoker who presents for evaluation of shortness of breath on exertion and occasional cough.  Patient previously diagnosed with COPD.  She is kindly referred by Dr. Lauraine Donzella.  HPI Discussed the use of AI scribe software for clinical note transcription with the patient, who gave verbal consent to proceed.  History of Present Illness   Valerie Vazquez is a 68 year old female with COPD who presents with persistent shortness of breath.  She experiences persistent shortness of breath, which does not vary with the seasons. COPD was diagnosed approximately ten years ago, and she currently uses a Ventolin  inhaler, which provides partial relief. She also has a dry cough that occurs spontaneously and is sometimes accompanied by palpitations.  She has had no hemoptysis.  She has a history of chest pain, which has been evaluated multiple times without cardiac causes identified. She suspects it may be related to her cervical and lumbar spine issues, with lumbar surgery performed in November of the previous year.  Her past medical history includes bilateral pleural effusions and atelectasis.  This is likely related to her chronic diastolic heart failure.  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 Chronic blunting of the right costophrenic angle has been noted on past imaging studies.  Her family history is significant for her father having stage four lung cancer, and he was a heavy smoker.  No  history of other lung diseases in the family.  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 She moved to [place]  in 2015, originally from [place], [place] .  She also reports nasal allergies for which she uses Flonase .    Review of Systems A 10 point review of systems was performed and it is as noted above otherwise negative.   Past Medical History:  Diagnosis Date   Acute right-sided low back pain with right-sided sciatica 10/23/2017   Allergy    Anemia    Anginal pain    Bilateral ovarian cysts    Bilateral renal cysts    Bulimia nervosa (HCC) 05/07/2021   CAD (coronary artery disease) 09/28/2016   Cataract    They are still forming   Chest pain 10/14/2015   CHF (congestive heart failure) (HCC)    Diastolic   Chronic back pain    upper and lower (09/19/2014)   Chronic shoulder pain (Location of Primary Source of Pain) (Left) 01/29/2016   Clotting disorder 04/2001   Blood clots/ on Eliquis    Colitis 04/08/2016   Colon polyp 01/30/2017   COPD (chronic obstructive pulmonary disease) (HCC)    Mild case   Coronary vasospasm 12/09/2014   DDD (degenerative disc disease), cervical    a.) s/p ACDF C5-C7   Depression    DVT (deep venous thrombosis) (HCC)    Esophageal spasm    Essential hypertension 10/28/2013   Gallstones    Gastritis    Gastroenteritis    GERD (gastroesophageal reflux  disease)    Heart disease 12/09/2016   High blood pressure    High cholesterol    History of blood transfusion 1985   related to hysterectomy   History of left heart catheterization (LHC) 12/09/2014   a.) LHC 12/09/2014: EF 50%; normal coronaries.   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 Rule out Paget's disease of the  bone with blood work.    Hyperlipidemia 10/28/2013   Hypertension    Hypothyroidism    IBS (irritable bowel syndrome)    Long term current use of anticoagulant    a.) apixaban    Lumbar central spinal stenosis (moderate at L4-5; mild at L2-3 and L3-4) 01/29/2016   Migraine    a few times/yr (09/19/2014)   Myocardial infarction (HCC) 10/2010 X 3   while hospitalized   Neck pain 01/29/2016   Osteoarthritis    Peripheral vascular disease    DVT/PE. On Eliquis    Pleural effusion, bilateral 07/31/2015   Pneumonia 04/2014   PONV (postoperative nausea and vomiting)    PTSD (post-traumatic stress disorder)    Hx. None currently (2024)   Pulmonary embolism (HCC) 04/2001; 09/19/2014   after gallbladder OR;    Schizoaffective disorder, depressive type (HCC) 08/28/2016   Sleep apnea    mild (09/19/2014)   Snoring    a. sleep study 5/16: No OSA   SOB (shortness of breath) 07/31/2015   Spinal stenosis    Spondylosis    Suicidal ideation 08/27/2016   a.) seen in ED with approx 6 months of reported SI. Reported ingestion of unknown pills to end life; last ingestion was 3 days PTA; UDS (+) for TCAs. SI related to depression, friend (loss of friend), and social determinants   Type II diabetes mellitus (HCC)    dx'd in 2007; lost weight; no RX for ~ 4 yr now (09/19/2014)    Past Surgical History:  Procedure Laterality Date   ABDOMINAL HYSTERECTOMY  10/1983   Partial hysterectomy uterus and tubes, ovaries removed in 2024?   ANTERIOR CERVICAL DECOMP/DISCECTOMY FUSION  10/2012   in [place]  C5-C7    APPENDECTOMY  1985   BACK SURGERY     BREAST CYST EXCISION Right    BREAST EXCISIONAL BIOPSY Right YRS AGO   NEG   CARDIAC CATHETERIZATION   2000's; 2009   CHOLECYSTECTOMY  2002   COLONOSCOPY WITH PROPOFOL  N/A 12/12/2016   Procedure: COLONOSCOPY WITH PROPOFOL ;  Surgeon: [name], MD;  Location: [facility] ENDOSCOPY;  Service: Endoscopy;  Laterality: N/A;   COLONOSCOPY WITH PROPOFOL  N/A  02/17/2017   Procedure: COLONOSCOPY WITH PROPOFOL ;  Surgeon: Kung Ruel, MD;  Location: [facility] ENDOSCOPY;  Service: Gastroenterology;  Laterality: N/A;   COLONOSCOPY WITH PROPOFOL  N/A 11/04/2021   Procedure: COLONOSCOPY WITH PROPOFOL ;  Surgeon: Kung Ruel, MD;  Location: [facility] ENDOSCOPY;  Service: Gastroenterology;  Laterality: N/A;   CYSTOSCOPY N/A 05/15/2022   Procedure: CYSTOSCOPY;  Surgeon: Leonce Garnette BIRCH, MD;  Location: [facility] ORS;  Service: Gynecology;  Laterality: N/A;   ESOPHAGOGASTRODUODENOSCOPY N/A 03/02/2024   Procedure: EGD (ESOPHAGOGASTRODUODENOSCOPY);  Surgeon: Kung Ruel, MD;  Location: [facility] ENDOSCOPY;  Service: Gastroenterology;  Laterality: N/A;  DM also on OZEMPIC    ESOPHAGOGASTRODUODENOSCOPY (EGD) WITH PROPOFOL  N/A 02/17/2017   Procedure: ESOPHAGOGASTRODUODENOSCOPY (EGD) WITH PROPOFOL ;  Surgeon: Kung Ruel, MD;  Location: [facility]  ENDOSCOPY;  Service: Gastroenterology;  Laterality: N/A;   ESOPHAGOGASTRODUODENOSCOPY (EGD) WITH PROPOFOL  N/A 03/07/2019   Procedure: ESOPHAGOGASTRODUODENOSCOPY (EGD) WITH PROPOFOL ;  Surgeon: Therisa Bi, MD;  Location: [facility] ENDOSCOPY;  Service: Gastroenterology;  Laterality: N/A;   EXCISION/RELEASE BURSA HIP Right 12/1984   HERNIA REPAIR  1985   IVC FILTER REMOVAL     2020s   JOINT REPLACEMENT     Left hip also a right hip bursectomy with complications   LEFT HEART CATHETERIZATION WITH CORONARY ANGIOGRAM N/A 12/09/2014   Procedure: LEFT HEART CATHETERIZATION WITH CORONARY ANGIOGRAM;  Surgeon: Lonni JONETTA Cash, MD;  Location: [facility] CATH LAB;  Service: Cardiovascular;  Laterality: N/A;   PARTIAL HYSTERECTOMY  1985   ROBOTIC ASSISTED BILATERAL SALPINGO OOPHERECTOMY Bilateral 05/15/2022   Procedure: XI ROBOTIC ASSISTED BILATERAL OOPHORECTOMY;  Surgeon: Leonce Garnette JONETTA, MD;  Location: [facility] ORS;  Service: Gynecology;  Laterality: Bilateral;   SPINE SURGERY  07/2023   TONSILLECTOMY AND ADENOIDECTOMY  ~ 1968   TOTAL HIP ARTHROPLASTY Left 04/06/2014    UMBILICAL HERNIA REPAIR  1985   VENA CAVA FILTER PLACEMENT  03/2014    Patient Active Problem List   Diagnosis Date Noted   Diabetic retinopathy of right eye associated with type 2 diabetes mellitus (HCC) 05/16/2024   Neurogenic claudication due to lumbar spinal stenosis 04/13/2024   Aortic atherosclerosis 04/13/2024   Near syncope 03/29/2024   Orthostatic hypotension 03/29/2024   Vaginal discharge 01/23/2024   Urethral prolapse 01/23/2024   Nocturia 01/23/2024   Incontinence of feces 01/22/2024   History of recurrent UTI (urinary tract infection) 01/22/2024   Decreased glomerular filtration rate (GFR) 01/21/2024   Influenza A 10/12/2023   Spondylolisthesis at L4-L5 level 07/27/2023   Left leg numbness 05/20/2023   Thoracic back pain 03/13/2023   Obesity (BMI 30.0-34.9) 12/10/2022   Subcutaneous nodule 06/09/2022   Numbness of right foot 03/05/2022   OAB (overactive bladder) 03/05/2022   Ingrown fingernail 12/27/2021   Cramps, extremity 11/27/2021   Skin lesion of lower extremity- right knee  09/26/2021   Retention cyst of nasal sinus 08/27/2021   Rash 08/27/2021   Bulimia (HCC) 01/14/2021   Cervical radiculitis 11/15/2020   Right arm pain 11/12/2020   Bilateral low back pain without sciatica 06/20/2020   Paronychia of great toe of right foot 04/05/2020   Paronychia of great toe, right 02/27/2020   Foraminal stenosis of cervical region 01/31/2020   Cervical fusion syndrome (C5-C7 2014) 01/31/2020   Ingrown toenail 01/27/2020   Chronic diastolic heart failure (HCC) 01/27/2020   Postmenopausal estrogen deficiency 08/12/2019   Left wrist pain 05/20/2019   Allergic rhinitis 01/27/2019   Pain of right lower extremity 10/06/2018   Mixed stress and urge urinary incontinence 10/27/2017   Elevated alkaline phosphatase level 10/27/2017   Hypothyroidism 08/26/2017   Type 2 diabetes mellitus with other specified complication (HCC) 05/29/2017   Right upper quadrant pain  01/12/2017   Plantar fasciitis of left foot 11/07/2016   CAD (coronary artery disease) 09/28/2016   Epigastric discomfort 09/16/2016   Schizoaffective disorder, depressive type (HCC) 08/28/2016   Cervicalgia 01/29/2016   Chronic low back pain (Location of Secondary source of pain) (Bilateral) (R>L) 01/29/2016   Chronic pain syndrome 01/29/2016   Primary osteoarthritis of right hip 01/29/2016   Lumbar central spinal stenosis (moderate at L4-5; mild at L2-3 and L3-4) 01/29/2016   Breast pain 10/19/2015   Abdominal pain 10/14/2015   Cough 07/31/2015   Esophageal spasm 06/05/2015   History of DVT (deep vein thrombosis)  02/25/2015   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   Constipation 12/12/2014   Coronary vasospasm 12/09/2014   History of pulmonary embolus (PE) 09/19/2014   Anxiety and depression 04/13/2014   IBS (irritable bowel syndrome) 04/11/2014   GERD (gastroesophageal reflux disease) 04/11/2014   Insomnia 04/11/2014   Hypertension associated with type 2 diabetes mellitus (HCC) 10/28/2013   Anemia 10/28/2013   Hyperlipidemia 10/28/2013    Family History  Problem Relation Age of Onset   Stroke Mother        Deceased   Lung cancer Father        Deceased   Alcohol abuse Father    Breast cancer Sister 69   Healthy Daughter    Cancer Brother    Healthy Son        x 2   Other Son        Suicide   Birth defects Son    Early death Son    Cancer Brother    Intellectual disability Son    Heart attack Neg Hx     Social History   Tobacco Use   Smoking status: Former    Current packs/day: 0.00    Average packs/day: 1.5 packs/day for 10.0 years (15.0 ttl pk-yrs)    Types: Cigarettes    Start date: 04/11/1969    Quit date: 04/12/1979    Years since quitting: 45.1   Smokeless tobacco: Never  Substance Use Topics   Alcohol use: No    Alcohol/week: 0.0 standard drinks of alcohol    Allergies  Allergen Reactions   Other Hives, Itching and Swelling    Hair Dye - Causes severe  sleepiness    Abilify [Aripiprazole] Other (See Comments)    Jerking movement, slow motor skills   Augmentin [Amoxicillin-Pot Clavulanate] Diarrhea and Nausea And Vomiting   Bactrim [Sulfamethoxazole-Trimethoprim] Diarrhea   Ceftin [Cefuroxime Axetil] Diarrhea   Clavulanic Acid    Coconut (Cocos Nucifera)     Rash    Diclofenac  Other (See Comments)     Muscle Pain   Indocin [Indomethacin] Other (See Comments)    Migraines    Linaclotide Diarrhea   Lisinopril Diarrhea and Other (See Comments)    Dizziness, Vomiting   Morphine  Other (See Comments)    Chest Tightness   Morphine  And Codeine  Other (See Comments)    Chest tightness    Simvastatin Other (See Comments)    Muscle Pain   Sulfa Antibiotics Diarrhea   Wellbutrin [Bupropion] Other (See Comments)    Jerking movement, slurred speech   Zetia  [Ezetimibe ]     Diarrhea    Seroquel [Quetiapine Fumarate] Palpitations    Current Meds  Medication Sig   albuterol  (VENTOLIN  HFA) 108 (90 Base) MCG/ACT inhaler TAKE 2 PUFFS BY MOUTH EVERY 6 HOURS AS NEEDED FOR WHEEZE OR SHORTNESS OF BREATH   apixaban  (ELIQUIS ) 5 MG TABS tablet Take 1 tablet (5 mg total) by mouth 2 (two) times daily.   b complex vitamins capsule Take 1 capsule by mouth daily.   baclofen  (LIORESAL ) 10 MG tablet TAKE ONE TABLET BY MOUTH THREE TIMES DAILY AS NEEDED FOR MUSCLE SPASMS (VIAL)   blood glucose meter kit and supplies KIT Dispense based on patient and insurance preference. Use once daily fasting. Dx code E11.9.   Blood Glucose Monitoring Suppl (GHT BLOOD GLUCOSE MONITOR) w/Device KIT DISPENSE BASED ON PATIENT AND INSURANCE PREFERENCE. USE ONCE DAILY AS DIRECTED. DX CODE E11.9.   dicyclomine  (BENTYL ) 20 MG tablet TAKE ONE TABLET BY MOUTH THREE TIMES DAILY AS  NEEDED FOR ABDOMINAL SPASMS (VIAL)   empagliflozin  (JARDIANCE ) 10 MG TABS tablet Take 1 tablet (10 mg total) by mouth daily before breakfast.   escitalopram  (LEXAPRO ) 20 MG tablet TAKE ONE TABLET BY MOUTH DAILY  AT 9AM   estradiol  (ESTRACE ) 0.1 MG/GM vaginal cream Place 0.5 g vaginally 2 (two) times a week. Place 0.5g nightly for two weeks then twice a week after   famotidine  (PEPCID ) 20 MG tablet TAKE 1 TABLET BY MOUTH EVERYDAY AT BEDTIME   ferrous sulfate  325 (65 FE) MG tablet Take 325 mg by mouth daily with breakfast.   fluticasone  (FLONASE ) 50 MCG/ACT nasal spray SPRAY 2 SPRAYS INTO EACH NOSTRIL EVERY DAY   furosemide  (LASIX ) 20 MG tablet TAKE ONE TABLET BY MOUTH DAILY AS NEEDED FOR EDEMA (VIAL)   gabapentin  (NEURONTIN ) 600 MG tablet TAKE ONE TABLET BY MOUTH DAILY AT 9AM and TAKE 1 AND 1/2 TABLETS BY MOUTH DAILY AT 9PM (VIAL)   levothyroxine  (SYNTHROID ) 25 MCG tablet TAKE ONE TABLET BY MOUTH DAILY AT 7AM BEFORE BREAKFAST AND 2-3 HOURS BEFORE TAKING ANY OTHER MEDICATION OR SUPPLEMENT   Magnesium  Citrate (MAGNESIUM  GUMMIES PO) Take 200 mg by mouth daily.   Multiple Vitamins-Minerals (MULTIVITAMIN GUMMIES ADULT) CHEW Chew 2 each by mouth daily.   nitroGLYCERIN  (NITROSTAT ) 0.4 MG SL tablet Place 1 tablet (0.4 mg total) under the tongue every 5 (five) minutes as needed for chest pain.   OLANZapine  (ZYPREXA ) 10 MG tablet TAKE 1 TABLET BY MOUTH EVERYDAY AT BEDTIME   ondansetron  (ZOFRAN -ODT) 4 MG disintegrating tablet TAKE 1 TABLET BY MOUTH EVERY 6 HOURS AS NEEDED FOR NAUSEA   pantoprazole  (PROTONIX ) 40 MG tablet TAKE 1 TABLET BY MOUTH EVERY DAY   potassium chloride  SA (KLOR-CON  M) 20 MEQ tablet TAKE ONE TABLET BY MOUTH DAILY AS NEEDED TAKE WITH LASIX  (VIAL) (Patient taking differently: 20 mEq as needed. TAKE ONE TABLET BY MOUTH DAILY AS NEEDED TAKE WITH LASIX  (VIAL))   Probiotic Product (PROBIOTIC BLEND) CAPS Take 1 capsule by mouth daily.   rosuvastatin  (CRESTOR ) 40 MG tablet TAKE 1 TABLET BY MOUTH EVERYDAY AT BEDTIME   Semaglutide ,0.25 or 0.5MG /DOS, (OZEMPIC , 0.25 OR 0.5 MG/DOSE,) 2 MG/3ML SOPN Inject 0.5 mg into the skin once a week.   senna (SENOKOT) 8.6 MG tablet Take 1 tablet by mouth daily.    sucralfate  (CARAFATE ) 1 g tablet TAKE 1 TABLET (1 G TOTAL) BY MOUTH 3 TIMES A DAY BEFORE MEALS   traMADol  (ULTRAM ) 50 MG tablet Take 1 tablet (50 mg total) by mouth every 12 (twelve) hours as needed for moderate pain (pain score 4-6).   Vibegron  (GEMTESA ) 75 MG TABS Take 1 tablet (75 mg total) by mouth daily.   vitamin C (ASCORBIC ACID) 250 MG tablet Take 250 mg by mouth daily.   Zinc  Citrate (ZINC  GUMMY PO) Take 2 each by mouth daily.    Immunization History  Administered Date(s) Administered   PNEUMOCOCCAL CONJUGATE-20 08/27/2021        Objective:     BP (!) 140/84   Pulse 76   Temp 97.6 F (36.4 C) (Temporal)   Ht 5' 6 (1.676 m)   Wt 186 lb 6.4 oz (84.6 kg)   SpO2 97%   BMI 30.09 kg/m   SpO2: 97 %  GENERAL: Well-developed, overweight woman, no acute distress.  Fully ambulatory.  No conversational dyspnea. HEAD: Normocephalic, atraumatic.  EYES: Pupils equal, round, reactive to light.  No scleral icterus.  MOUTH: Dentition intact, oral mucosa moist.  No thrush. NECK: Supple.  No thyromegaly. Trachea midline. No JVD.  No adenopathy. PULMONARY: Good air entry bilaterally.  No adventitious sounds. CARDIOVASCULAR: S1 and S2. Regular rate and rhythm.  No rubs, murmurs or gallops heard. ABDOMEN: Benign. MUSCULOSKELETAL: No joint deformity, no clubbing, no edema.  NEUROLOGIC: No overt focal deficit, no gait disturbance, speech is fluent. SKIN: Intact,warm,dry. PSYCH: Mood and behavior normal.  Chest x-ray obtained 20 March 2024 showing chronic blunting of the right costophrenic angle and some mild interstitial changes:    Assessment & Plan:     ICD-10-CM   1. Shortness of breath  R06.02 Pulmonary function test    CT CHEST HIGH RESOLUTION    2. Chronic diastolic heart failure (HCC)  P49.67     3. History of asbestos exposure  Z77.090 Pulmonary function test    CT CHEST HIGH RESOLUTION    4. Flu vaccine refused  Z28.21       Orders Placed This Encounter   Procedures   CT CHEST HIGH RESOLUTION    Standing Status:   Future    Expected Date:   06/16/2024    Expiration Date:   06/02/2025    Preferred imaging location?:   [facility]   Pulmonary function test    Standing Status:   Future    Expiration Date:   06/02/2025    Where should this test be performed?:   Outpatient Pulmonary    What type of PFT is being ordered?:   Full PFT   Discussion:    Chronic obstructive pulmonary disease (COPD) COPD diagnosed 10 years ago with symptoms of dyspnea and dry cough. Currently using Ventolin  inhaler with partial relief. No other inhalers in use. Symptoms are consistent and not seasonal. Smoking cessation in 1980. Family history of lung cancer in father. - Order pulmonary function tests to assess lung function and guide inhaler therapy - Evaluate results to determine appropriate maintenance inhaler - Follow up in 3-4 weeks to review test results and adjust treatment plan  Chronic bilateral pleural effusions with right costophrenic angle blunting Chronic blunting of the right costophrenic angle on x-ray, likely residual scarring from previous pleural effusions. Bilateral pleural effusions and atelectasis have resolved.  Pulmonary monitoring for asbestos exposure Asbestos exposure due to occupational history in [employer]. Monitoring is necessary to assess potential impact on lung health. - Order high-resolution CT scan to evaluate for asbestos-related lung changes - Use CT scan as a baseline for future monitoring  Allergic rhinitis Allergic rhinitis managed with Flonase . No specific allergens identified.    Advised if symptoms do not improve or worsen, to please contact office for sooner follow up or seek emergency care.    I spent 62 minutes of dedicated to the care of this patient on the date of this encounter to include pre-visit review of records, face-to-face time with the patient discussing conditions above, post visit ordering of  testing, clinical documentation with the electronic health record, making appropriate referrals as documented, and communicating necessary findings to members of the patients care team.   C. Leita Sanders, MD Advanced Bronchoscopy PCCM [facility]    *This note was dictated using voice recognition software/Dragon.  Despite best efforts to proofread, errors can occur which can change the meaning. Any transcriptional errors that result from this process are unintentional and may not be fully corrected at the time of dictation.

## 2024-06-03 NOTE — Telephone Encounter (Unsigned)
 Copied from CRM [id number]. Topic: General - Other >> Jun 02, 2024  4:02 PM Antwanette L wrote: Reason for CRM: Marty from [facility] ([address] [place], [place], [place] [address]) called to report that they have not received the surgical clearance from Dr. Donzella for a patient scheduled for an EGD. The patient is currently taking Apixaban  (Eliquis ) 5 mg tablets. Please fax the surgical clearance to [phone] >> Jun 02, 2024  4:15 PM Antwanette L wrote: [facility] has requested that the surgical clearance be faxed  by tomorrow afternoon to support the patient's upcoming procedure.

## 2024-06-04 ENCOUNTER — Other Ambulatory Visit: Payer: Self-pay | Admitting: Family Medicine

## 2024-06-04 ENCOUNTER — Other Ambulatory Visit: Payer: Self-pay | Admitting: Internal Medicine

## 2024-06-04 DIAGNOSIS — E785 Hyperlipidemia, unspecified: Secondary | ICD-10-CM

## 2024-06-04 DIAGNOSIS — G8929 Other chronic pain: Secondary | ICD-10-CM

## 2024-06-04 DIAGNOSIS — I5032 Chronic diastolic (congestive) heart failure: Secondary | ICD-10-CM

## 2024-06-04 DIAGNOSIS — E1169 Type 2 diabetes mellitus with other specified complication: Secondary | ICD-10-CM

## 2024-06-04 DIAGNOSIS — I152 Hypertension secondary to endocrine disorders: Secondary | ICD-10-CM

## 2024-06-06 DIAGNOSIS — K2100 Gastro-esophageal reflux disease with esophagitis, without bleeding: Secondary | ICD-10-CM | POA: Diagnosis not present

## 2024-06-06 DIAGNOSIS — K582 Mixed irritable bowel syndrome: Secondary | ICD-10-CM | POA: Diagnosis not present

## 2024-06-06 DIAGNOSIS — E038 Other specified hypothyroidism: Secondary | ICD-10-CM | POA: Diagnosis not present

## 2024-06-07 ENCOUNTER — Other Ambulatory Visit: Payer: Self-pay | Admitting: Family Medicine

## 2024-06-07 DIAGNOSIS — K582 Mixed irritable bowel syndrome: Secondary | ICD-10-CM

## 2024-06-07 DIAGNOSIS — K219 Gastro-esophageal reflux disease without esophagitis: Secondary | ICD-10-CM

## 2024-06-07 DIAGNOSIS — F411 Generalized anxiety disorder: Secondary | ICD-10-CM

## 2024-06-07 MED ORDER — EMPAGLIFLOZIN 10 MG PO TABS: 10.0000 mg | ORAL_TABLET | Freq: Every day | ORAL | 0 refills | Status: DC

## 2024-06-07 MED ORDER — ROSUVASTATIN CALCIUM 40 MG PO TABS: 40.0000 mg | ORAL_TABLET | Freq: Every evening | ORAL | 3 refills | Status: AC

## 2024-06-07 NOTE — Telephone Encounter (Signed)
 Too soon for refill for Jardiance  and rosuvastatin . Ozempic  discontinued on 04/06/24, dose change.  Requested Prescriptions  Pending Prescriptions Disp Refills   OZEMPIC , 1 MG/DOSE, 4 MG/3ML SOPN [Pharmacy Med Name: OZEMPIC  4 MG/3 ML (1 MG/DOSE)]  2    Sig: INJECT 1 MG ONCE A WEEK AS DIRECTED     Endocrinology:  Diabetes - GLP-1 Receptor Agonists - semaglutide  Failed - 06/07/2024  8:49 AM      Failed - HBA1C in normal range and within 180 days    Hemoglobin A1C  Date Value Ref Range Status  01/31/2022 6.6  Final   HbA1c, POC (prediabetic range)  Date Value Ref Range Status  06/29/2023 5.6 (A) 5.7 - 6.4 % Final   HbA1c, POC (controlled diabetic range)  Date Value Ref Range Status  06/29/2023 5.6 0.0 - 7.0 % Final   HbA1c POC (<> result, manual entry)  Date Value Ref Range Status  06/29/2023 5.6 4.0 - 5.6 % Final   Hgb A1c MFr Bld  Date Value Ref Range Status  05/18/2024 5.8 (H) 4.8 - 5.6 % Final    Comment:             Prediabetes: 5.7 - 6.4          Diabetes: >6.4          Glycemic control for adults with diabetes: <7.0          Passed - Cr in normal range and within 360 days    Creat  Date Value Ref Range Status  03/13/2023 0.91 0.50 - 1.05 mg/dL Final   Creatinine, Ser  Date Value Ref Range Status  05/18/2024 0.79 0.57 - 1.00 mg/dL Final         Passed - Valid encounter within last 6 months    Recent Outpatient Visits           2 weeks ago Type 2 diabetes mellitus with other specified complication, without long-term current use of insulin  [facility])   [place] [facility] [place], Lauraine SAILOR, DO   2 months ago Hypertension associated with type 2 diabetes mellitus [facility])   [place] [facility] Ruth, Lauraine SAILOR, DO   2 months ago Near syncope    [facility] [place], [place], PA-C   4 months ago Chronic bilateral low back pain, unspecified whether sciatica present   [facility] P[place],  Lauraine SAILOR, DO   4 months ago Establishing care with new doctor, encounter for   [facility], Lauraine SAILOR, DO       Future Appointments             In 1 month Guadlupe Lianne DASEN, MD [facility] at [facility], [facility]             rosuvastatin  (CRESTOR ) 40 MG tablet [Pharmacy Med Name: ROSUVASTATIN  CALCIUM  40 MG TAB] 90 tablet 0    Sig: TAKE 1 TABLET BY MOUTH EVERYDAY AT BEDTIME     Cardiovascular:  Antilipid - Statins 2 Failed - 06/07/2024  8:49 AM      Failed - Lipid Panel in normal range within the last 12 months    Cholesterol, Total  Date Value Ref Range Status  01/21/2024 149 100 - 199 mg/dL Final   LDL Cholesterol (Calc)  Date Value Ref Range Status  03/13/2023 68 mg/dL (calc) Final    Comment:    Reference range: <100 . Desirable range <100 mg/dL  for primary prevention;   <70 mg/dL for patients with CHD or diabetic patients  with > or = 2 CHD risk factors. SABRA LDL-C is now calculated using the Martin-Hopkins  calculation, which is a validated novel method providing  better accuracy than the Friedewald equation in the  estimation of LDL-C.  Gladis APPLETHWAITE et al. SANDREA. 7986;689(80): 2061-2068  (http://education.Quest[employer].com/faq/FAQ164)    LDL Chol Calc (NIH)  Date Value Ref Range Status  01/21/2024 82 0 - 99 mg/dL Final   Direct LDL  Date Value Ref Range Status  03/17/2022 108.0 mg/dL Final    Comment:    Optimal:  <100 mg/dLNear or Above Optimal:  100-129 mg/dLBorderline High:  130-159 mg/dLHigh:  160-189 mg/dLVery High:  >190 mg/dL   HDL  Date Value Ref Range Status  01/21/2024 52 >39 mg/dL Final   Triglycerides  Date Value Ref Range Status  01/21/2024 80 0 - 149 mg/dL Final         Passed - Cr in normal range and within 360 days    Creat  Date Value Ref Range Status  03/13/2023 0.91 0.50 - 1.05 mg/dL Final   Creatinine, Ser  Date Value Ref Range Status  05/18/2024 0.79 0.57 - 1.00 mg/dL Final          Passed - Patient is not pregnant      Passed - Valid encounter within last 12 months    Recent Outpatient Visits           2 weeks ago Type 2 diabetes mellitus with other specified complication, without long-term current use of insulin  [facility])    [facility] [place], Lauraine SAILOR, DO   2 months ago Hypertension associated with type 2 diabetes mellitus [facility])   [place] [facility] [place], Lauraine SAILOR, DO   2 months ago Near syncope   [place] [place], [place], PA-C   4 months ago Chronic bilateral low back pain, unspecified whether sciatica present   [facility], Lauraine SAILOR, DO   4 months ago Establishing care with new doctor, encounter for   [facility] P[place], Lauraine SAILOR, DO       Future Appointments             In 1 month Guadlupe Lianne DASEN, MD [facility]cology at [facility], [facility]             JARDIANCE  10 MG TABS tablet [Pharmacy Med Name: JARDIANCE  10 MG TABLET] 90 tablet 1    Sig: TAKE 1 TABLET BY MOUTH DAILY BEFORE BREAKFAST.     Endocrinology:  Diabetes - SGLT2 Inhibitors Passed - 06/07/2024  8:49 AM      Passed - Cr in normal range and within 360 days    Creat  Date Value Ref Range Status  03/13/2023 0.91 0.50 - 1.05 mg/dL Final   Creatinine, Ser  Date Value Ref Range Status  05/18/2024 0.79 0.57 - 1.00 mg/dL Final         Passed - HBA1C is between 0 and 7.9 and within 180 days    Hemoglobin A1C  Date Value Ref Range Status  01/31/2022 6.6  Final   HbA1c, POC (prediabetic range)  Date Value Ref Range Status  06/29/2023 5.6 (A) 5.7 - 6.4 % Final   HbA1c, POC (controlled diabetic range)  Date Value Ref Range Status  06/29/2023 5.6 0.0 - 7.0 % Final   HbA1c POC (<> result,  manual entry)  Date Value Ref Range Status  06/29/2023 5.6 4.0 - 5.6 % Final   Hgb A1c MFr Bld  Date Value Ref Range Status  05/18/2024 5.8 (H) 4.8 - 5.6  % Final    Comment:             Prediabetes: 5.7 - 6.4          Diabetes: >6.4          Glycemic control for adults with diabetes: <7.0          Passed - eGFR in normal range and within 360 days    EGFR (African American)  Date Value Ref Range Status  05/28/2014 >60 >73mL/min Final  04/07/2014 >60  Final   GFR calc Af Amer  Date Value Ref Range Status  06/01/2020 >60 >60 mL/min Final   EGFR (Non-African Amer.)  Date Value Ref Range Status  05/28/2014 >60 >67mL/min Final    Comment:    eGFR values <52mL/min/1.73 m2 may be an indication of chronic kidney disease (CKD). Calculated eGFR, using the MRDR Study equation, is useful in  patients with stable renal function. The eGFR calculation will not be reliable in acutely ill patients when serum creatinine is changing rapidly. It is not useful in patients on dialysis. The eGFR calculation may not be applicable to patients at the low and high extremes of body sizes, pregnant women, and vegetarians. POTASSIUM/AST/CREATININE - Slight hemolysis, interpret results with  - caution.   04/07/2014 >60  Final    Comment:    eGFR values <58mL/min/1.73 m2 may be an indication of chronic kidney disease (CKD). Calculated eGFR is useful in patients with stable renal function. The eGFR calculation will not be reliable in acutely ill patients when serum creatinine is changing rapidly. It is not useful in  patients on dialysis. The eGFR calculation may not be applicable to patients at the low and high extremes of body sizes, pregnant women, and vegetarians.    GFR, Estimated  Date Value Ref Range Status  03/31/2024 >60 >60 mL/min Final    Comment:    (NOTE) Calculated using the CKD-EPI Creatinine Equation (2021)    GFR  Date Value Ref Range Status  12/30/2022 55.78 (L) >60.00 mL/min Final    Comment:    Calculated using the CKD-EPI Creatinine Equation (2021)   eGFR  Date Value Ref Range Status  05/18/2024 80 >59 mL/min/1.73  Final         Passed - Valid encounter within last 6 months    Recent Outpatient Visits           2 weeks ago Type 2 diabetes mellitus with other specified complication, without long-term current use of insulin  [facility])   [place] [facility], Lauraine SAILOR, DO   2 months ago Hypertension associated with type 2 diabetes mellitus [facility])   [place] [facility] [place], Lauraine SAILOR, DO   2 months ago Near syncope   [place] [facility] [place], [place], PA-C   4 months ago Chronic bilateral low back pain, unspecified whether sciatica present   [facility] Pard[place], Lauraine SAILOR, DO   4 months ago Establishing care with new doctor, encounter for   [facility] Pard[facility], Lauraine SAILOR, DO       Future Appointments             In 1 month Guadlupe Lianne DASEN, MD [facility] Urogynecology at [place] [facility] for Women,  [facility]             traMADol  (ULTRAM ) 50 MG tablet [Pharmacy Med Name: TRAMADOL  HCL 50 MG TABLET] 60 tablet 0    Sig: TAKE 1 TABLET (50 MG TOTAL) BY MOUTH EVERY 12 (TWELVE) HOURS AS NEEDED FOR MODERATE PAIN (PAIN SCORE 4-6).     Not Delegated - Analgesics:  Opioid Agonists Failed - 06/07/2024  8:49 AM      Failed - This refill cannot be delegated      Failed - Urine Drug Screen completed in last 360 days      Passed - Valid encounter within last 3 months    Recent Outpatient Visits           2 weeks ago Type 2 diabetes mellitus with other specified complication, without long-term current use of insulin  [facility])   [facility] P[place], Lauraine SAILOR, DO   2 months ago Hypertension associated with type 2 diabetes mellitus [facility])   [place] [facility] [place], Lauraine SAILOR, DO   2 months ago Near syncope   [place] [facility] [place], [place], PA-C   4 months ago Chronic bilateral low back pain, unspecified whether sciatica present   [facility] P[facility], Lauraine SAILOR, DO   4 months ago Establishing care with new doctor, encounter for   [facility] Pardue, Lauraine SAILOR, DO       Future Appointments             In 1 month Guadlupe Lianne DASEN, MD [facility] Urogynecology at [facility] for Women, [facility]

## 2024-06-07 NOTE — Telephone Encounter (Signed)
 Requested medication (s) are due for refill today: yes  Requested medication (s) are on the active medication list: yes  Last refill:  02/22/24  Future visit scheduled: no  Notes to clinic:  Unable to refill per protocol, cannot delegate.      Requested Prescriptions  Pending Prescriptions Disp Refills   traMADol  (ULTRAM ) 50 MG tablet [Pharmacy Med Name: TRAMADOL  HCL 50 MG TABLET] 60 tablet 0    Sig: TAKE 1 TABLET (50 MG TOTAL) BY MOUTH EVERY 12 (TWELVE) HOURS AS NEEDED FOR MODERATE PAIN (PAIN SCORE 4-6).     Not Delegated - Analgesics:  Opioid Agonists Failed - 06/07/2024  8:50 AM      Failed - This refill cannot be delegated      Failed - Urine Drug Screen completed in last 360 days      Passed - Valid encounter within last 3 months    Recent Outpatient Visits           2 weeks ago Type 2 diabetes mellitus with other specified complication, without long-term current use of insulin  (HCC)   [place] [facility] Pardue, Valerie SAILOR, DO   2 months ago Hypertension associated with type 2 diabetes mellitus (HCC)   [place] [facility] Pard[place], Valerie SAILOR, DO   2 months ago Near syncope   [place] [facility] [place], [place], PA-C   4 months ago Chronic bilateral low back pain, unspecified whether sciatica present   [facility] Pardue, Valerie SAILOR, DO   4 months ago Establishing care with new doctor, encounter for   [facility] Pard[place], Valerie SAILOR, DO       Future Appointments             In 1 month Valerie Lianne DASEN, MD [facility] Urogynecology at [facility], [facility]            Refused Prescriptions Disp Refills   OZEMPIC , 1 MG/DOSE, 4 MG/3ML SOPN [Pharmacy Med Name: OZEMPIC  4 MG/3 ML (1 MG/DOSE)]  2    Sig: INJECT 1 MG ONCE A WEEK AS DIRECTED     Endocrinology:  Diabetes - GLP-1 Receptor Agonists - semaglutide  Failed - 06/07/2024  8:50 AM      Failed - HBA1C in normal range  and within 180 days    Hemoglobin A1C  Date Value Ref Range Status  01/31/2022 6.6  Final   HbA1c, POC (prediabetic range)  Date Value Ref Range Status  06/29/2023 5.6 (A) 5.7 - 6.4 % Final   HbA1c, POC (controlled diabetic range)  Date Value Ref Range Status  06/29/2023 5.6 0.0 - 7.0 % Final   HbA1c POC (<> result, manual entry)  Date Value Ref Range Status  06/29/2023 5.6 4.0 - 5.6 % Final   Hgb A1c MFr Bld  Date Value Ref Range Status  05/18/2024 5.8 (H) 4.8 - 5.6 % Final    Comment:             Prediabetes: 5.7 - 6.4          Diabetes: >6.4          Glycemic control for adults with diabetes: <7.0          Passed - Cr in normal range and within 360 days    Creat  Date Value Ref Range Status  03/13/2023 0.91 0.50 - 1.05 mg/dL Final   Creatinine, Ser  Date Value Ref Range Status  05/18/2024  0.79 0.57 - 1.00 mg/dL Final         Passed - Valid encounter within last 6 months    Recent Outpatient Visits           2 weeks ago Type 2 diabetes mellitus with other specified complication, without long-term current use of insulin  [facility])   [facility], Valerie SAILOR, DO   2 months ago Hypertension associated with type 2 diabetes mellitus [facility])   [place] [facility] [place], Valerie SAILOR, DO   2 months ago Near syncope    [facility] [place], [place], PA-C   4 months ago Chronic bilateral low back pain, unspecified whether sciatica present   [facility] Pardue, Valerie SAILOR, DO   4 months ago Establishing care with new doctor, encounter for   [facility] Pard[facility], Valerie SAILOR, DO       Future Appointments             In 1 month Valerie Lianne DASEN, MD [facility]rogy[facility] at [facility], [facility]             rosuvastatin  (CRESTOR ) 40 MG tablet [Pharmacy Med Name: ROSUVASTATIN  CALCIUM  40 MG TAB] 90 tablet 0    Sig: TAKE 1 TABLET BY MOUTH EVERYDAY AT BEDTIME      Cardiovascular:  Antilipid - Statins 2 Failed - 06/07/2024  8:50 AM      Failed - Lipid Panel in normal range within the last 12 months    Cholesterol, Total  Date Value Ref Range Status  01/21/2024 149 100 - 199 mg/dL Final   LDL Cholesterol (Calc)  Date Value Ref Range Status  03/13/2023 68 mg/dL (calc) Final    Comment:    Reference range: <100 . Desirable range <100 mg/dL for primary prevention;   <70 mg/dL for patients with CHD or diabetic patients  with > or = 2 CHD risk factors. SABRA LDL-C is now calculated using the Martin-Hopkins  calculation, which is a validated novel method providing  better accuracy than the Friedewald equation in the  estimation of LDL-C.  Gladis APPLETHWAITE et al. SANDREA. 7986;689(80): 2061-2068  (http://education.[facility].com/faq/FAQ164)    LDL Chol Calc (NIH)  Date Value Ref Range Status  01/21/2024 82 0 - 99 mg/dL Final   Direct LDL  Date Value Ref Range Status  03/17/2022 108.0 mg/dL Final    Comment:    Optimal:  <100 mg/dLNear or Above Optimal:  100-129 mg/dLBorderline High:  130-159 mg/dLHigh:  160-189 mg/dLVery High:  >190 mg/dL   HDL  Date Value Ref Range Status  01/21/2024 52 >39 mg/dL Final   Triglycerides  Date Value Ref Range Status  01/21/2024 80 0 - 149 mg/dL Final         Passed - Cr in normal range and within 360 days    Creat  Date Value Ref Range Status  03/13/2023 0.91 0.50 - 1.05 mg/dL Final   Creatinine, Ser  Date Value Ref Range Status  05/18/2024 0.79 0.57 - 1.00 mg/dL Final         Passed - Patient is not pregnant      Passed - Valid encounter within last 12 months    Recent Outpatient Visits           2 weeks ago Type 2 diabetes mellitus with other specified complication, without long-term current use of insulin  [facility])   [facility] [place], Valerie SAILOR,  DO   2 months ago Hypertension associated with type 2 diabetes mellitus [facility])   [place] [facility]  [place], Valerie SAILOR, DO   2 months ago Near syncope   [place] [facility] [place], [place], PA-C   4 months ago Chronic bilateral low back pain, unspecified whether sciatica present   [facility] P[facility], Valerie SAILOR, DO   4 months ago Establishing care with new doctor, encounter for   [facility] Pardue, Valerie SAILOR, DO       Future Appointments             In 1 month Valerie Lianne DASEN, MD [facility] Urogynecology at [facility], [facility]             JARDIANCE  10 MG TABS tablet [Pharmacy Med Name: JARDIANCE  10 MG TABLET] 90 tablet 1    Sig: TAKE 1 TABLET BY MOUTH DAILY BEFORE BREAKFAST.     Endocrinology:  Diabetes - SGLT2 Inhibitors Passed - 06/07/2024  8:50 AM      Passed - Cr in normal range and within 360 days    Creat  Date Value Ref Range Status  03/13/2023 0.91 0.50 - 1.05 mg/dL Final   Creatinine, Ser  Date Value Ref Range Status  05/18/2024 0.79 0.57 - 1.00 mg/dL Final         Passed - HBA1C is between 0 and 7.9 and within 180 days    Hemoglobin A1C  Date Value Ref Range Status  01/31/2022 6.6  Final   HbA1c, POC (prediabetic range)  Date Value Ref Range Status  06/29/2023 5.6 (A) 5.7 - 6.4 % Final   HbA1c, POC (controlled diabetic range)  Date Value Ref Range Status  06/29/2023 5.6 0.0 - 7.0 % Final   HbA1c POC (<> result, manual entry)  Date Value Ref Range Status  06/29/2023 5.6 4.0 - 5.6 % Final   Hgb A1c MFr Bld  Date Value Ref Range Status  05/18/2024 5.8 (H) 4.8 - 5.6 % Final    Comment:             Prediabetes: 5.7 - 6.4          Diabetes: >6.4          Glycemic control for adults with diabetes: <7.0          Passed - eGFR in normal range and within 360 days    EGFR (African American)  Date Value Ref Range Status  05/28/2014 >60 >35mL/min Final  04/07/2014 >60  Final   GFR calc Af Amer  Date Value Ref Range Status  06/01/2020 >60 >60 mL/min Final   EGFR (Non-African Amer.)   Date Value Ref Range Status  05/28/2014 >60 >59mL/min Final    Comment:    eGFR values <57mL/min/1.73 m2 may be an indication of chronic kidney disease (CKD). Calculated eGFR, using the MRDR Study equation, is useful in  patients with stable renal function. The eGFR calculation will not be reliable in acutely ill patients when serum creatinine is changing rapidly. It is not useful in patients on dialysis. The eGFR calculation may not be applicable to patients at the low and high extremes of body sizes, pregnant women, and vegetarians. POTASSIUM/AST/CREATININE - Slight hemolysis, interpret results with  - caution.   04/07/2014 >60  Final    Comment:    eGFR values <36mL/min/1.73 m2 may be an indication of chronic kidney disease (CKD). Calculated eGFR is useful in  patients with stable renal function. The eGFR calculation will not be reliable in acutely ill patients when serum creatinine is changing rapidly. It is not useful in  patients on dialysis. The eGFR calculation may not be applicable to patients at the low and high extremes of body sizes, pregnant women, and vegetarians.    GFR, Estimated  Date Value Ref Range Status  03/31/2024 >60 >60 mL/min Final    Comment:    (NOTE) Calculated using the CKD-EPI Creatinine Equation (2021)    GFR  Date Value Ref Range Status  12/30/2022 55.78 (L) >60.00 mL/min Final    Comment:    Calculated using the CKD-EPI Creatinine Equation (2021)   eGFR  Date Value Ref Range Status  05/18/2024 80 >59 mL/min/1.73 Final         Passed - Valid encounter within last 6 months    Recent Outpatient Visits           2 weeks ago Type 2 diabetes mellitus with other specified complication, without long-term current use of insulin  [facility])   [place] [facility] P[place], Valerie SAILOR, DO   2 months ago Hypertension associated with type 2 diabetes mellitus [facility])   [place] [facility] [place], Valerie SAILOR, DO   2 months  ago Near syncope   [facility] [place], [place], PA-C   4 months ago Chronic bilateral low back pain, unspecified whether sciatica present   [facility] Pardue, Valerie SAILOR, DO   4 months ago Establishing care with new doctor, encounter for   [facility] P[facility], Valerie SAILOR, DO       Future Appointments             In 1 month Valerie Lianne DASEN, MD [facility] Urogynecology at [facility] for Women, [facility]

## 2024-06-08 ENCOUNTER — Ambulatory Visit: Admitting: Anesthesiology

## 2024-06-08 ENCOUNTER — Other Ambulatory Visit: Payer: Self-pay | Admitting: Family Medicine

## 2024-06-08 ENCOUNTER — Ambulatory Visit
Admission: RE | Admit: 2024-06-08 | Discharge: 2024-06-08 | Disposition: A | Discharge status: Home / Self Care | Attending: Gastroenterology | Admitting: Gastroenterology

## 2024-06-08 ENCOUNTER — Encounter: Payer: Self-pay | Admitting: Gastroenterology

## 2024-06-08 ENCOUNTER — Encounter
Admission: RE | Disposition: A | Discharge status: Home / Self Care | Payer: Self-pay | Source: Home / Self Care | Attending: Gastroenterology

## 2024-06-08 DIAGNOSIS — R1310 Dysphagia, unspecified: Secondary | ICD-10-CM | POA: Diagnosis not present

## 2024-06-08 DIAGNOSIS — I2510 Atherosclerotic heart disease of native coronary artery without angina pectoris: Secondary | ICD-10-CM | POA: Diagnosis not present

## 2024-06-08 DIAGNOSIS — Z7984 Long term (current) use of oral hypoglycemic drugs: Secondary | ICD-10-CM | POA: Diagnosis not present

## 2024-06-08 DIAGNOSIS — Z87891 Personal history of nicotine dependence: Secondary | ICD-10-CM | POA: Diagnosis not present

## 2024-06-08 DIAGNOSIS — K219 Gastro-esophageal reflux disease without esophagitis: Secondary | ICD-10-CM | POA: Diagnosis not present

## 2024-06-08 DIAGNOSIS — J449 Chronic obstructive pulmonary disease, unspecified: Secondary | ICD-10-CM | POA: Diagnosis not present

## 2024-06-08 DIAGNOSIS — E1151 Type 2 diabetes mellitus with diabetic peripheral angiopathy without gangrene: Secondary | ICD-10-CM | POA: Diagnosis not present

## 2024-06-08 DIAGNOSIS — I10 Essential (primary) hypertension: Secondary | ICD-10-CM | POA: Insufficient documentation

## 2024-06-08 DIAGNOSIS — G4730 Sleep apnea, unspecified: Secondary | ICD-10-CM | POA: Insufficient documentation

## 2024-06-08 DIAGNOSIS — E039 Hypothyroidism, unspecified: Secondary | ICD-10-CM | POA: Insufficient documentation

## 2024-06-08 DIAGNOSIS — F411 Generalized anxiety disorder: Secondary | ICD-10-CM

## 2024-06-08 HISTORY — PX: ESOPHAGOGASTRODUODENOSCOPY: SHX5428

## 2024-06-08 LAB — GLUCOSE, CAPILLARY: Glucose-Capillary: 96 mg/dL (ref 70–99)

## 2024-06-08 SURGERY — EGD (ESOPHAGOGASTRODUODENOSCOPY)
Anesthesia: General

## 2024-06-08 MED ORDER — LIDOCAINE HCL (CARDIAC) PF 100 MG/5ML IV SOSY
PREFILLED_SYRINGE | INTRAVENOUS | Status: DC | PRN
  Administered 2024-06-08: 60 mg via INTRAVENOUS

## 2024-06-08 MED ORDER — SODIUM CHLORIDE 0.9 % IV SOLN
INTRAVENOUS | Status: DC
  Administered 2024-06-08: 20 mL/h via INTRAVENOUS

## 2024-06-08 MED ORDER — PROPOFOL 1000 MG/100ML IV EMUL
INTRAVENOUS | Status: AC
  Filled 2024-06-08: qty 100

## 2024-06-08 MED ORDER — LIDOCAINE HCL (PF) 2 % IJ SOLN
INTRAMUSCULAR | Status: AC
Start: 2024-06-08 — End: 2024-06-08
  Filled 2024-06-08: qty 5

## 2024-06-08 MED ORDER — PROPOFOL 10 MG/ML IV BOLUS
INTRAVENOUS | Status: DC | PRN
  Administered 2024-06-08: 75 mg via INTRAVENOUS
  Administered 2024-06-08: 50 mg via INTRAVENOUS

## 2024-06-08 NOTE — Transfer of Care (Signed)
 Immediate Anesthesia Transfer of Care Note  Patient: Valerie Vazquez  Procedure(s) Performed: EGD (ESOPHAGOGASTRODUODENOSCOPY)  Patient Location: PACU  Anesthesia Type:General  Level of Consciousness: awake and sedated  Airway & Oxygen  Therapy: Patient Spontanous Breathing and Patient connected to nasal cannula oxygen   Post-op Assessment: Report given to RN and Post -op Vital signs reviewed and stable  Post vital signs: Reviewed and stable  Last Vitals:  Vitals Value Taken Time  BP    Temp    Pulse    Resp    SpO2      Last Pain:  Vitals:   06/08/24 0638  TempSrc: Temporal  PainSc: 6          Complications: There were no known notable events for this encounter.

## 2024-06-08 NOTE — Anesthesia Preprocedure Evaluation (Signed)
 Anesthesia Evaluation  Patient identified by MRN, date of birth, ID band Patient awake    Reviewed: Allergy & Precautions, NPO status , Patient's Chart, lab work & pertinent test results  History of Anesthesia Complications (+) PONV and history of anesthetic complications  Airway Mallampati: II  TM Distance: >3 FB Neck ROM: full    Dental  (+) Partial Lower, Partial Upper   Pulmonary neg pulmonary ROS, sleep apnea , COPD, former smoker   Pulmonary exam normal  + decreased breath sounds      Cardiovascular Exercise Tolerance: Good hypertension, Pt. on medications + CAD and + Peripheral Vascular Disease  negative cardio ROS Normal cardiovascular exam Rhythm:Regular Rate:Normal     Neuro/Psych  Headaches  Anxiety   Schizophrenia  negative neurological ROS  negative psych ROS   GI/Hepatic negative GI ROS, Neg liver ROS,GERD  Medicated,,  Endo/Other  negative endocrine ROSdiabetes, Type 2, Oral Hypoglycemic AgentsHypothyroidism    Renal/GU negative Renal ROS  negative genitourinary   Musculoskeletal  (+) Arthritis ,    Abdominal Normal abdominal exam  (+)   Peds negative pediatric ROS (+)  Hematology negative hematology ROS (+) Blood dyscrasia, anemia   Anesthesia Other Findings Past Medical History: 10/23/2017: Acute right-sided low back pain with right-sided sciatica No date: Allergy No date: Anemia No date: Anginal pain No date: Bilateral ovarian cysts No date: Bilateral renal cysts 05/07/2021: Bulimia nervosa (HCC) 09/28/2016: CAD (coronary artery disease) No date: Cataract     Comment:  They are still forming 10/14/2015: Chest pain No date: CHF (congestive heart failure) (HCC)     Comment:  Diastolic No date: Chronic back pain     Comment:  upper and lower (09/19/2014) 01/29/2016: Chronic shoulder pain (Location of Primary Source of  Pain) (Left) 04/2001: Clotting disorder     Comment:  Blood clots/  on Eliquis  04/08/2016: Colitis 01/30/2017: Colon polyp No date: COPD (chronic obstructive pulmonary disease) (HCC)     Comment:  Mild case 12/09/2014: Coronary vasospasm No date: DDD (degenerative disc disease), cervical     Comment:  a.) s/p ACDF C5-C7 No date: Depression No date: DVT (deep venous thrombosis) (HCC) No date: Esophageal spasm 10/28/2013: Essential hypertension No date: Gallstones No date: Gastritis No date: Gastroenteritis No date: GERD (gastroesophageal reflux disease) 12/09/2016: Heart disease No date: High blood pressure No date: High cholesterol 1985: History of blood transfusion     Comment:  related to hysterectomy 12/09/2014: History of left heart catheterization (LHC)     Comment:  a.) LHC 12/09/2014: EF 50%; normal coronaries. No date: History of nuclear stress test     Comment:  Myoview  1/19:  EF 65, no ischemia or scar 09/19/2014: History of pulmonary embolus (PE) 01/29/2016: Hx of cervical spine surgery 02/13/2015: Hypercementosis     Comment:  Per patient diagnosed in [place] . Rule out Paget's               disease of the bone with blood work.  10/28/2013: Hyperlipidemia No date: Hypertension No date: Hypothyroidism No date: IBS (irritable bowel syndrome) No date: Long term current use of anticoagulant     Comment:  a.) apixaban  01/29/2016: Lumbar central spinal stenosis (moderate at L4-5; mild at  L2-3 and L3-4) No date: Migraine     Comment:  a few times/yr (09/19/2014) 10/2010 X 3: Myocardial infarction [facility])     Comment:  while hospitalized 01/29/2016: Neck pain No date: Osteoarthritis No date: Peripheral vascular disease     Comment:  DVT/PE. On Eliquis   07/31/2015: Pleural effusion, bilateral 04/2014: Pneumonia No date: PONV (postoperative nausea and vomiting) No date: PTSD (post-traumatic stress disorder)     Comment:  Hx. None currently (2024) 04/2001; 09/19/2014: Pulmonary embolism (HCC)     Comment:  after gallbladder OR;   08/28/2016: Schizoaffective disorder, depressive type (HCC) No date: Sleep apnea     Comment:  mild (09/19/2014) No date: Snoring     Comment:  a. sleep study 5/16: No OSA 07/31/2015: SOB (shortness of breath) No date: Spinal stenosis No date: Spondylosis 08/27/2016: Suicidal ideation     Comment:  a.) seen in ED with approx 6 months of reported SI.               Reported ingestion of unknown pills to end life; last               ingestion was 3 days PTA; UDS (+) for TCAs. SI related to              depression, friend (loss of friend), and social               determinants No date: Type II diabetes mellitus (HCC)     Comment:  dx'd in 2007; lost weight; no RX for ~ 4 yr now               (09/19/2014)  Past Surgical History: 10/1983: ABDOMINAL HYSTERECTOMY     Comment:  Partial hysterectomy uterus and tubes, ovaries removed               in 2024? 10/2012: ANTERIOR CERVICAL DECOMP/DISCECTOMY FUSION     Comment:  in [place]  C5-C7  1985: APPENDECTOMY No date: BACK SURGERY No date: BREAST CYST EXCISION; Right YRS AGO: BREAST EXCISIONAL BIOPSY; Right     Comment:  NEG  2000's; 2009: CARDIAC CATHETERIZATION 2002: CHOLECYSTECTOMY 12/12/2016: COLONOSCOPY WITH PROPOFOL ; N/A     Comment:  Procedure: COLONOSCOPY WITH PROPOFOL ;  Surgeon: [name], MD;  Location: [facility] ENDOSCOPY;  Service: Endoscopy;              Laterality: N/A; 02/17/2017: COLONOSCOPY WITH PROPOFOL ; N/A     Comment:  Procedure: COLONOSCOPY WITH PROPOFOL ;  Surgeon: Kung Ruel, MD;  Location[facility] ENDOSCOPY;  Service:               Gastroenterology;  Laterality: N/A; 11/04/2021: COLONOSCOPY WITH PROPOFOL ; N/A     Comment:  Procedure: COLONOSCOPY WITH PROPOFOL ;  Surgeon: Kung Ruel, MD;  Location: [facility] ENDOSCOPY;  Service:               Gastroenterology;  Laterality: N/A; 05/15/2022: CYSTOSCOPY; N/A     Comment:  Procedure: CYSTOSCOPY;  Surgeon: Leonce Garnette BIRCH,  MD;              Location: [facility] ORS;  Service: Gynecology;  Laterality:               N/A; 03/02/2024: ESOPHAGOGASTRODUODENOSCOPY; N/A     Comment:  Procedure: EGD (ESOPHAGOGASTRODUODENOSCOPY);  Surgeon:               Kung Ruel, MD;  Location: [facility] ENDOSCOPY;  Service:               Gastroenterology;  Laterality: N/A;  DM  also on OZEMPIC  02/17/2017: ESOPHAGOGASTRODUODENOSCOPY (EGD) WITH PROPOFOL ; N/A     Comment:  Procedure: ESOPHAGOGASTRODUODENOSCOPY (EGD) WITH               PROPOFOL ;  Surgeon: Therisa Bi, MD;  Location: [facility]               ENDOSCOPY;  Service: Gastroenterology;  Laterality: N/A; 03/07/2019: ESOPHAGOGASTRODUODENOSCOPY (EGD) WITH PROPOFOL ; N/A     Comment:  Procedure: ESOPHAGOGASTRODUODENOSCOPY (EGD) WITH               PROPOFOL ;  Surgeon: Therisa Bi, MD;  Location: [facility]               ENDOSCOPY;  Service: Gastroenterology;  Laterality: N/A; 12/1984: EXCISION/RELEASE BURSA HIP; Right 1985: HERNIA REPAIR No date: IVC FILTER REMOVAL     Comment:  2020s No date: JOINT REPLACEMENT     Comment:  Left hip also a right hip bursectomy with complications 12/09/2014: LEFT HEART CATHETERIZATION WITH CORONARY ANGIOGRAM; N/A     Comment:  Procedure: LEFT HEART CATHETERIZATION WITH CORONARY               ANGIOGRAM;  Surgeon: Lonni JONETTA Cash, MD;                Location: [facility] CATH LAB;  Service: Cardiovascular;                Laterality: N/A; 1985: PARTIAL HYSTERECTOMY 05/15/2022: ROBOTIC ASSISTED BILATERAL SALPINGO OOPHERECTOMY;  Bilateral     Comment:  Procedure: XI ROBOTIC ASSISTED BILATERAL OOPHORECTOMY;                Surgeon: Leonce Garnette JONETTA, MD;  Location: [facility] ORS;                Service: Gynecology;  Laterality: Bilateral; 07/2023: SPINE SURGERY ~ 1968: TONSILLECTOMY AND ADENOIDECTOMY 04/06/2014: TOTAL HIP ARTHROPLASTY; Left 1985: UMBILICAL HERNIA REPAIR 03/2014: VENA CAVA FILTER PLACEMENT  BMI    Body Mass Index: 29.80 kg/m       Reproductive/Obstetrics negative OB ROS                              Anesthesia Physical Anesthesia Plan  ASA: 3  Anesthesia Plan: General   Post-op Pain Management:    Induction: Intravenous  PONV Risk Score and Plan: Propofol  infusion and TIVA  Airway Management Planned: Natural Airway and Nasal Cannula  Additional Equipment:   Intra-op Plan:   Post-operative Plan:   Informed Consent: I have reviewed the patients History and Physical, chart, labs and discussed the procedure including the risks, benefits and alternatives for the proposed anesthesia with the patient or authorized representative who has indicated his/her understanding and acceptance.     Dental Advisory Given  Plan Discussed with: CRNA  Anesthesia Plan Comments:         Anesthesia Quick Evaluation

## 2024-06-08 NOTE — Anesthesia Postprocedure Evaluation (Signed)
 Anesthesia Post Note  Patient: Valerie Vazquez  Procedure(s) Performed: EGD (ESOPHAGOGASTRODUODENOSCOPY)  Patient location during evaluation: PACU Anesthesia Type: General Level of consciousness: awake Pain management: satisfactory to patient Vital Signs Assessment: post-procedure vital signs reviewed and stable Cardiovascular status: stable Anesthetic complications: no   There were no known notable events for this encounter.   Last Vitals:  Vitals:   06/08/24 0803 06/08/24 0812  BP: 122/60 127/68  Pulse: 69 65  Resp: 15 13  Temp:    SpO2: 93% 96%    Last Pain:  Vitals:   06/08/24 0812  TempSrc:   PainSc: 0-No pain                 VAN STAVEREN,Mikai Meints

## 2024-06-08 NOTE — H&P (Signed)
 Valerie Vazquez , MD [address], [place], [place], [address] Phone: [phone] Fax: [phone]  Primary Care Physician:  Valerie Lauraine SAILOR, DO   Pre-Procedure History & Physical: HPI:  Valerie Vazquez is a 68 y.o. female is here for an endoscopy    Past Medical History:  Diagnosis Date   Acute right-sided low back pain with right-sided sciatica 10/23/2017   Allergy    Anemia    Anginal pain    Bilateral ovarian cysts    Bilateral renal cysts    Bulimia nervosa (HCC) 05/07/2021   CAD (coronary artery disease) 09/28/2016   Cataract    They are still forming   Chest pain 10/14/2015   CHF (congestive heart failure) (HCC)    Diastolic   Chronic back pain    upper and lower (09/19/2014)   Chronic shoulder pain (Location of Primary Source of Pain) (Left) 01/29/2016   Clotting disorder 04/2001   Blood clots/ on Eliquis    Colitis 04/08/2016   Colon polyp 01/30/2017   COPD (chronic obstructive pulmonary disease) (HCC)    Mild case   Coronary vasospasm 12/09/2014   DDD (degenerative disc disease), cervical    a.) s/p ACDF C5-C7   Depression    DVT (deep venous thrombosis) (HCC)    Esophageal spasm    Essential hypertension 10/28/2013   Gallstones    Gastritis    Gastroenteritis    GERD (gastroesophageal reflux disease)    Heart disease 12/09/2016   High blood pressure    High cholesterol    History of blood transfusion 1985   related to hysterectomy   History of left heart catheterization (LHC) 12/09/2014   a.) LHC 12/09/2014: EF 50%; normal coronaries.   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 Rule out Paget's disease of the bone with blood work.    Hyperlipidemia 10/28/2013   Hypertension    Hypothyroidism    IBS (irritable bowel syndrome)    Long term  current use of anticoagulant    a.) apixaban    Lumbar central spinal stenosis (moderate at L4-5; mild at L2-3 and L3-4) 01/29/2016   Migraine    a few times/yr (09/19/2014)   Myocardial infarction (HCC) 10/2010 X 3   while hospitalized   Neck pain 01/29/2016   Osteoarthritis    Peripheral vascular disease    DVT/PE. On Eliquis    Pleural effusion, bilateral 07/31/2015   Pneumonia 04/2014   PONV (postoperative nausea and vomiting)    PTSD (post-traumatic stress disorder)    Hx. None currently (2024)   Pulmonary embolism (HCC) 04/2001; 09/19/2014   after gallbladder OR;    Schizoaffective disorder, depressive type (HCC) 08/28/2016   Sleep apnea    mild (09/19/2014)   Snoring    a. sleep study 5/16: No OSA   SOB (shortness of breath) 07/31/2015   Spinal stenosis    Spondylosis    Suicidal ideation 08/27/2016   a.) seen in ED with approx 6 months of reported SI. Reported ingestion of unknown pills to end life; last ingestion was 3 days PTA; UDS (+) for TCAs. SI related to depression, friend (loss of friend), and social determinants   Type II diabetes mellitus (HCC)    dx'd in 2007; lost weight; no RX for ~ 4  yr now (09/19/2014)    Past Surgical History:  Procedure Laterality Date   ABDOMINAL HYSTERECTOMY  10/1983   Partial hysterectomy uterus and tubes, ovaries removed in 2024?   ANTERIOR CERVICAL DECOMP/DISCECTOMY FUSION  10/2012   in [place]  C5-C7    APPENDECTOMY  1985   BACK SURGERY     BREAST CYST EXCISION Right    BREAST EXCISIONAL BIOPSY Right YRS AGO   NEG   CARDIAC CATHETERIZATION   2000's; 2009   CHOLECYSTECTOMY  2002   COLONOSCOPY WITH PROPOFOL  N/A 12/12/2016   Procedure: COLONOSCOPY WITH PROPOFOL ;  Surgeon: Valerie Kung, MD;  Location: [facility] ENDOSCOPY;  Service: Endoscopy;  Laterality: N/A;   COLONOSCOPY WITH PROPOFOL  N/A 02/17/2017   Procedure: COLONOSCOPY WITH PROPOFOL ;  Surgeon: Vazquez Ruel, MD;  Location: [facility] ENDOSCOPY;  Service: Gastroenterology;   Laterality: N/A;   COLONOSCOPY WITH PROPOFOL  N/A 11/04/2021   Procedure: COLONOSCOPY WITH PROPOFOL ;  Surgeon: Vazquez Ruel, MD;  Location: [facility] ENDOSCOPY;  Service: Gastroenterology;  Laterality: N/A;   CYSTOSCOPY N/A 05/15/2022   Procedure: CYSTOSCOPY;  Surgeon: Leonce Garnette BIRCH, MD;  Location: [facility] ORS;  Service: Gynecology;  Laterality: N/A;   ESOPHAGOGASTRODUODENOSCOPY N/A 03/02/2024   Procedure: EGD (ESOPHAGOGASTRODUODENOSCOPY);  Surgeon: Vazquez Ruel, MD;  Location: [facility] ENDOSCOPY;  Service: Gastroenterology;  Laterality: N/A;  DM also on OZEMPIC    ESOPHAGOGASTRODUODENOSCOPY (EGD) WITH PROPOFOL  N/A 02/17/2017   Procedure: ESOPHAGOGASTRODUODENOSCOPY (EGD) WITH PROPOFOL ;  Surgeon: Vazquez Ruel, MD;  Location[facility] END[facility]COPY;  Service: Gastroenterology;  Laterality: N/A;   ESOPHAGOGASTRODUODENOSCOPY (EGD) WITH PROPOFOL  N/A 03/07/2019   Procedure: ESOPHAGOGASTRODUODENOSCOPY (EGD) WITH PROPOFOL ;  Surgeon: Vazquez Ruel, MD;  Location: [facility] ENDOSCOPY;  Service: Gastroenterology;  Laterality: N/A;   EXCISION/RELEASE BURSA HIP Right 12/1984   HERNIA REPAIR  1985   IVC FILTER REMOVAL     2020s   JOINT REPLACEMENT     Left hip also a right hip bursectomy with complications   LEFT HEART CATHETERIZATION WITH CORONARY ANGIOGRAM N/A 12/09/2014   Procedure: LEFT HEART CATHETERIZATION WITH CORONARY ANGIOGRAM;  Surgeon: Lonni BIRCH Cash, MD;  Location: [facility] CATH LAB;  Service: Cardiovascular;  Laterality: N/A;   PARTIAL HYSTERECTOMY  1985   ROBOTIC ASSISTED BILATERAL SALPINGO OOPHERECTOMY Bilateral 05/15/2022   Procedure: XI ROBOTIC ASSISTED BILATERAL OOPHORECTOMY;  Surgeon: Leonce Garnette BIRCH, MD;  Location: [facility] ORS;  Service: Gynecology;  Laterality: Bilateral;   SPINE SURGERY  07/2023   TONSILLECTOMY AND ADENOIDECTOMY  ~ 1968   TOTAL HIP ARTHROPLASTY Left 04/06/2014   UMBILICAL HERNIA REPAIR  1985   VENA CAVA FILTER PLACEMENT  03/2014    Prior to Admission medications   Medication Sig Start  Date End Date Taking? Authorizing Provider  albuterol  (VENTOLIN  HFA) 108 (90 Base) MCG/ACT inhaler TAKE 2 PUFFS BY MOUTH EVERY 6 HOURS AS NEEDED FOR WHEEZE OR SHORTNESS OF BREATH 12/17/23  Yes Marylynn Verneita CROME, MD  apixaban  (ELIQUIS ) 5 MG TABS tablet Take 1 tablet (5 mg total) by mouth 2 (two) times daily. 11/16/23  Yes Marylynn Verneita CROME, MD  b complex vitamins capsule Take 1 capsule by mouth daily.   Yes [provider]  baclofen  (LIORESAL ) 10 MG tablet TAKE ONE TABLET BY MOUTH THREE TIMES DAILY AS NEEDED FOR MUSCLE SPASMS (VIAL) 04/28/24  Yes Pardue, Sarah N, DO  blood glucose meter kit and supplies KIT Dispense based on patient and insurance preference. Use once daily fasting. Dx code E11.9. 08/27/21  Yes Maribeth Camellia MATSU, MD  Blood Glucose Monitoring Suppl (GHT BLOOD GLUCOSE MONITOR) w/Device KIT DISPENSE BASED  ON PATIENT AND INSURANCE PREFERENCE. USE ONCE DAILY AS DIRECTED. DX CODE E11.9. 01/14/21  Yes Maribeth Camellia MATSU, MD  dicyclomine  (BENTYL ) 20 MG tablet TAKE ONE TABLET BY MOUTH THREE TIMES DAILY AS NEEDED FOR ABDOMINAL SPASMS (VIAL) 07/27/23  Yes Maribeth Camellia MATSU, MD  empagliflozin  (JARDIANCE ) 10 MG TABS tablet Take 1 tablet (10 mg total) by mouth daily before breakfast. 07/18/24  Yes Pardue, Lauraine SAILOR, DO  escitalopram  (LEXAPRO ) 20 MG tablet TAKE ONE TABLET BY MOUTH DAILY AT 9AM 10/24/22  Yes Maribeth Camellia MATSU, MD  estradiol  (ESTRACE ) 0.1 MG/GM vaginal cream Place 0.5 g vaginally 2 (two) times a week. Place 0.5g nightly for two weeks then twice a week after 01/25/24  Yes Guadlupe Dull T, MD  famotidine  (PEPCID ) 20 MG tablet TAKE 1 TABLET BY MOUTH EVERYDAY AT BEDTIME 02/19/24  Yes Pardue, Sarah N, DO  ferrous sulfate  325 (65 FE) MG tablet Take 325 mg by mouth daily with breakfast.   Yes [provider]  fluticasone  (FLONASE ) 50 MCG/ACT nasal spray SPRAY 2 SPRAYS INTO EACH NOSTRIL EVERY DAY 07/27/23  Yes Maribeth Camellia MATSU, MD  furosemide  (LASIX ) 20 MG tablet TAKE ONE TABLET BY MOUTH  DAILY AS NEEDED FOR EDEMA (VIAL) 01/12/23  Yes Maribeth Camellia MATSU, MD  gabapentin  (NEURONTIN ) 600 MG tablet TAKE ONE TABLET BY MOUTH DAILY AT 9AM and TAKE 1 AND 1/2 TABLETS BY MOUTH DAILY AT 9PM (VIAL) 03/17/23  Yes Maribeth Camellia MATSU, MD  levothyroxine  (SYNTHROID ) 25 MCG tablet TAKE ONE TABLET BY MOUTH DAILY AT 7AM BEFORE BREAKFAST AND 2-3 HOURS BEFORE TAKING ANY OTHER MEDICATION OR SUPPLEMENT 09/03/23  Yes Maribeth Camellia MATSU, MD  Magnesium  Citrate (MAGNESIUM  GUMMIES PO) Take 200 mg by mouth daily.   Yes [provider]  Multiple Vitamins-Minerals (MULTIVITAMIN GUMMIES ADULT) CHEW Chew 2 each by mouth daily.   Yes [provider]  OLANZapine  (ZYPREXA ) 10 MG tablet TAKE 1 TABLET BY MOUTH EVERYDAY AT BEDTIME 11/17/22  Yes Maribeth Camellia MATSU, MD  ondansetron  (ZOFRAN -ODT) 4 MG disintegrating tablet TAKE 1 TABLET BY MOUTH EVERY 6 HOURS AS NEEDED FOR NAUSEA 12/28/23  Yes [place], PA-C  pantoprazole  (PROTONIX ) 40 MG tablet TAKE 1 TABLET BY MOUTH EVERY DAY 10/21/23  Yes [place], PA-C  potassium chloride  SA (KLOR-CON  M) 20 MEQ tablet TAKE ONE TABLET BY MOUTH DAILY AS NEEDED TAKE WITH LASIX  (VIAL) Patient taking differently: 20 mEq as needed. TAKE ONE TABLET BY MOUTH DAILY AS NEEDED TAKE WITH LASIX  (VIAL) 12/29/22  Yes Maribeth Camellia MATSU, MD  Probiotic Product (PROBIOTIC BLEND) CAPS Take 1 capsule by mouth daily.   Yes [provider]  rosuvastatin  (CRESTOR ) 40 MG tablet Take 1 tablet (40 mg total) by mouth at bedtime. 06/07/24  Yes Pardue, Lauraine SAILOR, DO  Semaglutide ,0.25 or 0.5MG /DOS, (OZEMPIC , 0.25 OR 0.5 MG/DOSE,) 2 MG/3ML SOPN Inject 0.5 mg into the skin once a week. 04/06/24  Yes Pardue, Lauraine SAILOR, DO  senna (SENOKOT) 8.6 MG tablet Take 1 tablet by mouth daily. 02/23/24  Yes [provider]  sucralfate  (CARAFATE ) 1 g tablet TAKE 1 TABLET (1 G TOTAL) BY MOUTH 3 TIMES A DAY BEFORE MEALS 02/19/24  Yes [place], PA-C  traMADol  (ULTRAM ) 50 MG tablet TAKE 1 TABLET (50 MG  TOTAL) BY MOUTH EVERY 12 (TWELVE) HOURS AS NEEDED FOR MODERATE PAIN (PAIN SCORE 4-6). 06/07/24  Yes Pardue, Lauraine SAILOR, DO  Vibegron  (GEMTESA ) 75 MG TABS Take 1 tablet (75 mg total) by mouth daily. 04/28/24  Yes Guadlupe Dull T,  MD  vitamin C (ASCORBIC ACID) 250 MG tablet Take 250 mg by mouth daily.   Yes [provider]  Zinc  Citrate (ZINC  GUMMY PO) Take 2 each by mouth daily.   Yes [provider]  nitroGLYCERIN  (NITROSTAT ) 0.4 MG SL tablet Place 1 tablet (0.4 mg total) under the tongue every 5 (five) minutes as needed for chest pain. 06/26/23   Darliss Rogue, MD    Allergies as of 05/28/2024 - Review Complete 05/18/2024  Allergen Reaction Noted   Other Hives, Itching, and Swelling 12/09/2014   Abilify [aripiprazole] Other (See Comments) 09/20/2013   Augmentin [amoxicillin-pot clavulanate] Diarrhea and Nausea And Vomiting 09/20/2013   Bactrim [sulfamethoxazole-trimethoprim] Diarrhea 09/20/2013   Ceftin [cefuroxime axetil] Diarrhea 09/20/2013   Clavulanic acid  09/11/2022   Coconut (cocos nucifera)  10/19/2015   Diclofenac  Other (See Comments) 08/01/2014   Indocin [indomethacin] Other (See Comments) 09/20/2013   Linaclotide Diarrhea 12/09/2014   Lisinopril Diarrhea and Other (See Comments) 09/20/2013   Morphine  Other (See Comments) 12/09/2014   Morphine  and codeine  Other (See Comments) 09/20/2013   Simvastatin Other (See Comments) 08/01/2014   Sulfa antibiotics Diarrhea 12/09/2014   Wellbutrin [bupropion] Other (See Comments) 09/20/2013   Zetia  [ezetimibe ]  09/18/2017   Seroquel [quetiapine fumarate] Palpitations 08/16/2014    Family History  Problem Relation Age of Onset   Stroke Mother        Deceased   Lung cancer Father        Deceased   Alcohol abuse Father    Breast cancer Sister 67   Healthy Daughter    Cancer Brother    Healthy Son        x 2   Other Son        Suicide   Birth defects Son    Early death Son    Cancer Brother    Intellectual  disability Son    Heart attack Neg Hx     Social History   Socioeconomic History   Marital status: Divorced    Spouse name: Not on file   Number of children: Not on file   Years of education: Not on file   Highest education level: Associate degree: occupational, Scientist, product/process development, or vocational program  Occupational History   Not on file  Tobacco Use   Smoking status: Former    Current packs/day: 0.00    Average packs/day: 1.5 packs/day for 10.0 years (15.0 ttl pk-yrs)    Types: Cigarettes    Start date: 04/11/1969    Quit date: 04/12/1979    Years since quitting: 45.1   Smokeless tobacco: Never  Vaping Use   Vaping status: Never Used  Substance and Sexual Activity   Alcohol use: No    Alcohol/week: 0.0 standard drinks of alcohol   Drug use: No   Sexual activity: Not Currently  Other Topics Concern   Not on file  Social History Narrative   Lives with fiancee in an apartment on the first floor.  Has 3 grown children.  One child passed away.  On disability 1995/06/15 - 2000, 2007 - current.     Education: some college.   Caffeine rarely   retired   Teacher, early years/pre Strain: Medium Risk (03/28/2024)   Overall Financial Resource Strain (CARDIA)    Difficulty of Paying Living Expenses: Somewhat hard  Food Insecurity: No Food Insecurity (03/28/2024)   Hunger Vital Sign    Worried About Running Out of Food in the Last Year: Never true  Ran Out of Food in the Last Year: Never true  Recent Concern: Food Insecurity - Food Insecurity Present (02/23/2024)   Received from [facility]   Hunger Vital Sign    Within the past 12 months, you worried that your food would run out before you got the money to buy more.: Sometimes true    Within the past 12 months, the food you bought just didn't last and you didn't have money to get more.: Sometimes true  Transportation Needs: No Transportation Needs (03/28/2024)   PRAPARE - Scientist, research (physical sciences) (Medical): No    Lack of Transportation (Non-Medical): No  Physical Activity: Insufficiently Active (03/28/2024)   Exercise Vital Sign    Days of Exercise per Week: 3 days    Minutes of Exercise per Session: 30 min  Stress: No Stress Concern Present (03/28/2024)   [employer] of Occupational Health - Occupational Stress Questionnaire    Feeling of Stress: Not at all  Social Connections: Socially Integrated (03/28/2024)   Social Connection and Isolation Panel    Frequency of Communication with Friends and Family: Twice a week    Frequency of Social Gatherings with Friends and Family: Twice a week    Attends Religious Services: More than 4 times per year    Active Member of [employer] or Organizations: Yes    Attends Engineer, structural: More than 4 times per year    Marital Status: Married  Catering manager Violence: Not At Risk (02/10/2024)   Humiliation, Afraid, Rape, and Kick questionnaire    Fear of Current or Ex-Partner: No    Emotionally Abused: No    Physically Abused: No    Sexually Abused: No    Review of Systems: See HPI, otherwise negative ROS  Physical Exam: BP 119/65   Pulse 73   Temp (!) 96.4 F (35.8 C) (Temporal)   Resp 20   Wt 83.7 kg   SpO2 98%   BMI 29.80 kg/m  General:   Alert,  pleasant and cooperative in NAD Head:  Normocephalic and atraumatic. Neck:  Supple; no masses or thyromegaly. Lungs:  Clear throughout to auscultation, normal respiratory effort.    Heart:  +S1, +S2, Regular rate and rhythm, No edema. Abdomen:  Soft, nontender and nondistended. Normal bowel sounds, without guarding, and without rebound.   Neurologic:  Alert and  oriented x4;  grossly normal neurologically.  Impression/Plan: Valerie Vazquez is here for an endoscopy  to be performed for  evaluation of dysphagia    Risks, benefits, limitations, and alternatives regarding endoscopy have been reviewed with the patient.  Questions have been answered.  All  parties agreeable.   Valerie Kung, MD  06/08/2024, 7:40 AM

## 2024-06-08 NOTE — Op Note (Signed)
 [facility] Gastroenterology Patient Name: Valerie Vazquez Procedure Date: 06/08/2024 7:20 AM MRN: [id number] Account #: [id number] Date of Birth: 10-Nov-1955 Admit Type: Outpatient Age: 68 Room: [facility] ENDO ROOM 3 Gender: Female Note Status: Finalized Instrument Name: Upper GI Scope [id number] Procedure:             Upper GI endoscopy Indications:           Dysphagia Providers:             [name] MD, MD Referring MD:          [name] MD, MD (Referring MD), Lauraine GEANNIE Buoy                         (Referring MD) Medicines:             Monitored Anesthesia Care Complications:         No immediate complications. Procedure:             Pre-Anesthesia Assessment:                        - Prior to the procedure, a History and Physical was                         performed, and patient medications, allergies and                         sensitivities were reviewed. The patient's tolerance                         of previous anesthesia was reviewed.                        - The risks and benefits of the procedure and the                         sedation options and risks were discussed with the                         patient. All questions were answered and informed                         consent was obtained.                        - ASA Grade Assessment: II - A patient with mild                         systemic disease.                        After obtaining informed consent, the endoscope was                         passed under direct vision. Throughout the procedure,                         the patient's blood pressure, pulse, and oxygen                          saturations were  monitored continuously. The Endoscope                         was introduced through the mouth, and advanced to the                         third part of duodenum. The upper GI endoscopy was                         accomplished with ease. The patient tolerated the                          procedure well. Findings:      The esophagus was normal.      The stomach was normal.      The examined duodenum was normal.      The cardia and gastric fundus were normal on retroflexion. Impression:            - Normal esophagus.                        - Normal stomach.                        - Normal examined duodenum.                        - No specimens collected. Recommendation:        - Discharge patient to home (with escort).                        - Resume previous diet.                        - Continue present medications.                        - Return to GI clinic as previously scheduled. Procedure Code(s):     --- Professional ---                        [phone], Esophagogastroduodenoscopy, flexible,                         transoral; diagnostic, including collection of                         specimen(s) by brushing or washing, when performed                         (separate procedure) Diagnosis Code(s):     --- Professional ---                        R13.10, Dysphagia, unspecified CPT copyright 2022 American Medical Association. All rights reserved. The codes documented in this report are preliminary and upon coder review may  be revised to meet current compliance requirements. [name], MD [name] MD, MD 06/08/2024 7:48:28 AM This report has been signed electronically. Number of Addenda: 0 Note Initiated On: 06/08/2024 7:20 AM Estimated Blood Loss:  Estimated blood loss: none.      [facility]

## 2024-06-08 NOTE — Telephone Encounter (Signed)
 Copied from CRM [id number]. Topic: Clinical - Medication Refill >> Jun 08, 2024 10:12 AM Mia F wrote: Medication: gabapentin  (NEURONTIN ) 600 MG tablet   Has the patient contacted their pharmacy? Yes (Agent: If no, request that the patient contact the pharmacy for the refill. If patient does not wish to contact the pharmacy document the reason why and proceed with request.) (Agent: If yes, when and what did the pharmacy advise?)  This is the patient's preferred pharmacy:  [id number] - [place], [place] - [address] [place] [address] Phone: [phone] Fax: [phone]  Is this the correct pharmacy for this prescription? Yes If no, delete pharmacy and type the correct one.   Has the prescription been filled recently? No  Is the patient out of the medication? Yes  Has the patient been seen for an appointment in the last year OR does the patient have an upcoming appointment? Yes  Can we respond through MyChart? Yes  Agent: Please be advised that Rx refills may take up to 3 business days. We ask that you follow-up with your pharmacy.

## 2024-06-09 ENCOUNTER — Ambulatory Visit
Admission: RE | Admit: 2024-06-09 | Discharge: 2024-06-09 | Disposition: A | Discharge status: Home / Self Care | Source: Ambulatory Visit | Attending: Pulmonary Disease | Admitting: Pulmonary Disease

## 2024-06-09 DIAGNOSIS — R0602 Shortness of breath: Secondary | ICD-10-CM | POA: Insufficient documentation

## 2024-06-09 DIAGNOSIS — J90 Pleural effusion, not elsewhere classified: Secondary | ICD-10-CM | POA: Diagnosis not present

## 2024-06-09 DIAGNOSIS — I2510 Atherosclerotic heart disease of native coronary artery without angina pectoris: Secondary | ICD-10-CM | POA: Diagnosis not present

## 2024-06-09 DIAGNOSIS — Z77090 Contact with and (suspected) exposure to asbestos: Secondary | ICD-10-CM | POA: Insufficient documentation

## 2024-06-10 NOTE — Telephone Encounter (Signed)
 Copied from CRM [id number]. Topic: Clinical - Prescription Issue >> Jun 10, 2024  1:36 PM Joesph B wrote: Reason for CRM: patient would like to know why her prescriptions are being denied? Can someone call patient?

## 2024-06-10 NOTE — Telephone Encounter (Signed)
 Requested medication (s) are due for refill today: yes  Requested medication (s) are on the active medication list: yes  Last refill:  03/17/23  Future visit scheduled: no  Notes to clinic:  Unable to refill per protocol, last refill by another provider.      Requested Prescriptions  Pending Prescriptions Disp Refills   gabapentin  (NEURONTIN ) 600 MG tablet 225 tablet 11    Sig: TAKE ONE TABLET BY MOUTH DAILY AT 9AM and TAKE 1 AND 1/2 TABLETS BY MOUTH DAILY AT 9PM (VIAL)     Neurology: Anticonvulsants - gabapentin  Passed - 06/10/2024  8:35 AM      Passed - Cr in normal range and within 360 days    Creat  Date Value Ref Range Status  03/13/2023 0.91 0.50 - 1.05 mg/dL Final   Creatinine, Ser  Date Value Ref Range Status  05/18/2024 0.79 0.57 - 1.00 mg/dL Final         Passed - Completed PHQ-2 or PHQ-9 in the last 360 days      Passed - Valid encounter within last 12 months    Recent Outpatient Visits           3 weeks ago Type 2 diabetes mellitus with other specified complication, without long-term current use of insulin  [facility])   [place] [facility] Pardue, Lauraine SAILOR, DO   2 months ago Hypertension associated with type 2 diabetes mellitus [facility])   [place] [facility] [place], Lauraine SAILOR, DO   2 months ago Near syncope   [place] [facility] [place], [place], PA-C   4 months ago Chronic bilateral low back pain, unspecified whether sciatica present   [facility] [place] Pardue, Lauraine SAILOR, DO   4 months ago Establishing care with new doctor, encounter for   [facility] Pardue, Lauraine SAILOR, DO       Future Appointments             In 1 month Guadlupe Lianne DASEN, MD [facility] at [facility]

## 2024-06-10 NOTE — Telephone Encounter (Signed)
 LOV- 05/18/2024 NOV- 08/17/2024 LRF- 03/17/2023 Filled by Camellia Somerset, MD Disp. 225 X 11

## 2024-06-13 ENCOUNTER — Other Ambulatory Visit: Payer: Self-pay | Admitting: Family Medicine

## 2024-06-13 DIAGNOSIS — G894 Chronic pain syndrome: Secondary | ICD-10-CM

## 2024-06-13 NOTE — Telephone Encounter (Signed)
 Called patient to inform her of the prescriptions that the provider did send in to the pharmacy, patient verbalized understanding.

## 2024-06-13 NOTE — Telephone Encounter (Signed)
 Should this patient be getting her medication for Gabapentin  filled by Camellia Somerset, MD?  Doesn't look like you have ever filled this for her.  Please advise.

## 2024-06-15 ENCOUNTER — Ambulatory Visit: Payer: Self-pay | Admitting: Pulmonary Disease

## 2024-06-15 DIAGNOSIS — J410 Simple chronic bronchitis: Secondary | ICD-10-CM

## 2024-06-15 DIAGNOSIS — Z77090 Contact with and (suspected) exposure to asbestos: Secondary | ICD-10-CM

## 2024-06-16 DIAGNOSIS — M47816 Spondylosis without myelopathy or radiculopathy, lumbar region: Secondary | ICD-10-CM | POA: Diagnosis not present

## 2024-06-16 NOTE — Telephone Encounter (Signed)
 There is no further need of workup for that.  That is residual from when she had episodes of fluid around her lung from diastolic heart failure.  That has not changed her over time and will not progress.  It is a residual inflammation scarring that is leftover but it is not lung fibrosis.

## 2024-06-20 ENCOUNTER — Ambulatory Visit (INDEPENDENT_AMBULATORY_CARE_PROVIDER_SITE_OTHER): Admitting: Family Medicine

## 2024-06-20 ENCOUNTER — Ambulatory Visit: Admitting: Dietician

## 2024-06-20 ENCOUNTER — Encounter: Payer: Self-pay | Admitting: Family Medicine

## 2024-06-20 VITALS — BP 133/81 | HR 80 | Temp 98.5°F | Ht 66.0 in | Wt 185.0 lb

## 2024-06-20 DIAGNOSIS — R918 Other nonspecific abnormal finding of lung field: Secondary | ICD-10-CM

## 2024-06-20 DIAGNOSIS — J410 Simple chronic bronchitis: Secondary | ICD-10-CM | POA: Diagnosis not present

## 2024-06-20 DIAGNOSIS — H02842 Edema of right lower eyelid: Secondary | ICD-10-CM

## 2024-06-20 MED ORDER — BUDESONIDE-FORMOTEROL FUMARATE 80-4.5 MCG/ACT IN AERO: 2.0000 | INHALATION_SPRAY | Freq: Two times a day (BID) | RESPIRATORY_TRACT | 3 refills | Status: DC

## 2024-06-20 NOTE — Progress Notes (Signed)
 Established patient visit   Patient: Valerie Vazquez   DOB: May 22, 1956   68 y.o. Female  MRN: [id number] Visit Date: 06/20/2024  Today's healthcare provider: LAURAINE LOISE BUOY, DO   Chief Complaint  Patient presents with   Follow-up    States that she is here today for a lab follow up and medical advice.   Would like a referral for the right side of her face, looks like knots under the skin below her right eye.  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   Subjective    HPI Valerie Vazquez is a 68 year old female with COPD who presents with concerns about a skin lesion near her eye and respiratory issues.  She has a skin lesion near her eye that originated from a reported boil/cyst on the inside of the eye, which burst and left a spot. Over time, this spot has cascaded downwards. There is no pain associated with the lesion, and she has not consulted a dermatologist or mentioned it to her eye doctor.  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  The lesion has been present for years and has become more noticeable after touching it.  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 She has undergone a CT scan following and x-ray that showed chronic blunting.  The CT scan was overall reassuring only showing a small amount of basilar air trapping consistent with small airway disease.  She has COPD, which she manages with Ventolin  as needed, though she has not used it recently. She quit smoking in 1980.  She experiences some shortness of breath but not to the extent that she needs to use her Ventolin  inhaler frequently. She does not have a daily inhaler and has not had recent bronchitis. She is concerned about the risk of lung cancer due to her family history and past asbestos  exposure.       Medications: Outpatient Medications Prior to Visit  Medication Sig   albuterol  (VENTOLIN  HFA) 108 (90 Base) MCG/ACT inhaler TAKE 2 PUFFS BY MOUTH EVERY 6 HOURS AS NEEDED FOR WHEEZE OR SHORTNESS OF BREATH   b complex vitamins capsule Take 1 capsule by mouth daily.   baclofen  (LIORESAL ) 10 MG tablet Take 1 tablet (10 mg total) by mouth 3 (three) times daily as needed for muscle spasms.   blood glucose meter kit and supplies KIT Dispense based on patient and insurance preference. Use once daily fasting. Dx code E11.9.   Blood Glucose Monitoring Suppl (GHT BLOOD GLUCOSE MONITOR) w/Device KIT DISPENSE BASED ON PATIENT AND INSURANCE PREFERENCE. USE ONCE DAILY AS DIRECTED. DX CODE E11.9.   dicyclomine  (BENTYL ) 20 MG tablet TAKE ONE TABLET BY MOUTH THREE TIMES DAILY AS NEEDED FOR ABDOMINAL SPASMS (VIAL)   ELIQUIS  5 MG TABS tablet TAKE 1 TABLET BY MOUTH TWICE A DAY   [START ON 07/18/2024] empagliflozin  (JARDIANCE ) 10 MG TABS tablet Take 1 tablet (10 mg total) by mouth daily before breakfast.   escitalopram  (LEXAPRO ) 20 MG tablet TAKE ONE TABLET BY MOUTH DAILY AT 9AM   estradiol  (ESTRACE ) 0.1 MG/GM vaginal cream Place 0.5 g vaginally 2 (two) times a week. Place 0.5g nightly for two weeks then twice a week after   famotidine  (PEPCID ) 20 MG  tablet TAKE 1 TABLET BY MOUTH EVERYDAY AT BEDTIME   ferrous sulfate  325 (65 FE) MG tablet Take 325 mg by mouth daily with breakfast.   fluticasone  (FLONASE ) 50 MCG/ACT nasal spray SPRAY 2 SPRAYS INTO EACH NOSTRIL EVERY DAY   furosemide  (LASIX ) 20 MG tablet TAKE ONE TABLET BY MOUTH DAILY AS NEEDED FOR EDEMA (VIAL)   gabapentin  (NEURONTIN ) 600 MG tablet TAKE 1 TABLET BY MOUTH DAILY AT 9AM, AND 1 AND 1/2 TABS AT 9PM   levothyroxine  (SYNTHROID ) 25 MCG tablet TAKE ONE TABLET BY MOUTH DAILY AT 7AM BEFORE BREAKFAST AND 2-3 HOURS BEFORE TAKING ANY OTHER MEDICATION OR SUPPLEMENT   Magnesium  Citrate (MAGNESIUM  GUMMIES PO) Take 200 mg by mouth daily.    Multiple Vitamins-Minerals (MULTIVITAMIN GUMMIES ADULT) CHEW Chew 2 each by mouth daily.   nitroGLYCERIN  (NITROSTAT ) 0.4 MG SL tablet Place 1 tablet (0.4 mg total) under the tongue every 5 (five) minutes as needed for chest pain.   OLANZapine  (ZYPREXA ) 10 MG tablet TAKE 1 TABLET BY MOUTH EVERYDAY AT BEDTIME   ondansetron  (ZOFRAN -ODT) 4 MG disintegrating tablet TAKE 1 TABLET BY MOUTH EVERY 6 HOURS AS NEEDED FOR NAUSEA   pantoprazole  (PROTONIX ) 40 MG tablet TAKE 1 TABLET BY MOUTH EVERY DAY   potassium chloride  SA (KLOR-CON  M) 20 MEQ tablet TAKE ONE TABLET BY MOUTH DAILY AS NEEDED TAKE WITH LASIX  (VIAL) (Patient taking differently: 20 mEq as needed. TAKE ONE TABLET BY MOUTH DAILY AS NEEDED TAKE WITH LASIX  (VIAL))   Probiotic Product (PROBIOTIC BLEND) CAPS Take 1 capsule by mouth daily.   rosuvastatin  (CRESTOR ) 40 MG tablet Take 1 tablet (40 mg total) by mouth at bedtime.   Semaglutide ,0.25 or 0.5MG /DOS, (OZEMPIC , 0.25 OR 0.5 MG/DOSE,) 2 MG/3ML SOPN Inject 0.5 mg into the skin once a week.   senna (SENOKOT) 8.6 MG tablet Take 1 tablet by mouth daily.   sucralfate  (CARAFATE ) 1 g tablet TAKE 1 TABLET (1 G TOTAL) BY MOUTH 3 TIMES A DAY BEFORE MEALS   traMADol  (ULTRAM ) 50 MG tablet TAKE 1 TABLET (50 MG TOTAL) BY MOUTH EVERY 12 (TWELVE) HOURS AS NEEDED FOR MODERATE PAIN (PAIN SCORE 4-6).   Vibegron  (GEMTESA ) 75 MG TABS Take 1 tablet (75 mg total) by mouth daily.   vitamin C (ASCORBIC ACID) 250 MG tablet Take 250 mg by mouth daily.   Zinc  Citrate (ZINC  GUMMY PO) Take 2 each by mouth daily.   No facility-administered medications prior to visit.        Objective    BP 133/81 (BP Location: Right Arm, Patient Position: Sitting, Cuff Size: Normal)   Pulse 80   Temp 98.5 F (36.9 C) (Oral)   Ht 5' 6 (1.676 m)   Wt 185 lb (83.9 kg)   SpO2 97%   BMI 29.86 kg/m     Physical Exam Vitals and nursing note reviewed.  Constitutional:      General: She is not in acute distress.    Appearance: Normal  appearance.  HENT:     Head: Normocephalic and atraumatic.      Comments: Swelling under right eyelid (see picture under media tab) Eyes:     General: No scleral icterus.    Conjunctiva/sclera: Conjunctivae normal.  Cardiovascular:     Rate and Rhythm: Normal rate.  Pulmonary:     Effort: Pulmonary effort is normal.  Neurological:     Mental Status: She is alert and oriented to person, place, and time. Mental status is at baseline.  Psychiatric:  Mood and Affect: Mood normal.        Behavior: Behavior normal.      No results found for any visits on 06/20/24.  Assessment & Plan    Simple chronic bronchitis (HCC) -     Budesonide-Formoterol Fumarate; Inhale 2 puffs into the lungs 2 (two) times daily.  Dispense: 1 each; Refill: 3  Blunting of right costophrenic angle on chest x-ray  Swelling of right lower eyelid -     Ambulatory referral to Dermatology -     Ambulatory referral to Ophthalmology      Simple chronic bronchitis; blunting of right costophrenic angle on chest x-ray Chronic COPD with airway disease and blunting of costophrenic angle. No recent exacerbations. CT scan shows no evidence of or concerns for malignancy. Reduced lung cancer risk due to smoking cessation in 1980.  Counseled patient that risks of biopsy exceed her likely benefit at this point and that is why it was not recommended by pulmonology during her visit.  Answered patient's questions regarding potential for getting a second opinion; she will be contacting her insurance to see if it is covered prior to pursuing this. - Prescribe Symbicort to address ongoing symptoms of fatigue/shortness of breath. - Encourage exercises promoting deep breathing. - Monitor for worsening shortness of breath or persistent cough.  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 Differential includes dermatological or oculoplastic issues. - Photograph  lesion for documentation; photographs located under media tab. - Refer to oculofacial plastic surgeon for further evaluation and recommendations. - Per patient preference, also referred to dermatology.     Return in about 8 weeks (around 08/17/2024), or if symptoms worsen or fail to improve, for as scheduled.      I discussed the assessment and treatment plan with the patient  The patient was provided an opportunity to ask questions and all were answered. The patient agreed with the plan and demonstrated an understanding of the instructions.   The patient was advised to call back or seek an in-person evaluation if the symptoms worsen or if the condition fails to improve as anticipated.    LAURAINE LOISE BUOY, DO  [facility] [phone] (phone) [phone] (fax)  [facility]

## 2024-06-22 ENCOUNTER — Ambulatory Visit

## 2024-06-22 DIAGNOSIS — R0602 Shortness of breath: Secondary | ICD-10-CM | POA: Diagnosis not present

## 2024-06-22 DIAGNOSIS — Z77090 Contact with and (suspected) exposure to asbestos: Secondary | ICD-10-CM

## 2024-06-22 LAB — PULMONARY FUNCTION TEST
DL/VA % pred: 109 %
DL/VA: 4.51 ml/min/mmHg/L
DLCO unc % pred: 83 %
DLCO unc: 17.15 ml/min/mmHg
FEF 25-75 Post: 2.78 L/s
FEF 25-75 Pre: 1.92 L/s
FEF2575-%Change-Post: 44 %
FEF2575-%Pred-Post: 137 %
FEF2575-%Pred-Pre: 95 %
FEV1-%Change-Post: 7 %
FEV1-%Pred-Post: 83 %
FEV1-%Pred-Pre: 78 %
FEV1-Post: 2.06 L
FEV1-Pre: 1.92 L
FEV1FVC-%Change-Post: 7 %
FEV1FVC-%Pred-Pre: 105 %
FEV6-%Change-Post: 0 %
FEV6-%Pred-Post: 76 %
FEV6-%Pred-Pre: 77 %
FEV6-Post: 2.39 L
FEV6-Pre: 2.39 L
FEV6FVC-%Pred-Post: 104 %
FEV6FVC-%Pred-Pre: 104 %
FVC-%Change-Post: 0 %
FVC-%Pred-Post: 73 %
FVC-%Pred-Pre: 73 %
FVC-Post: 2.39 L
FVC-Pre: 2.39 L
Post FEV1/FVC ratio: 86 %
Post FEV6/FVC ratio: 100 %
Pre FEV1/FVC ratio: 80 %
Pre FEV6/FVC Ratio: 100 %
RV % pred: 98 %
RV: 2.24 L
TLC % pred: 86 %
TLC: 4.64 L

## 2024-06-22 NOTE — Progress Notes (Signed)
 Full PFT completed today ? ?

## 2024-06-22 NOTE — Patient Instructions (Signed)
 Full PFT completed today ? ?

## 2024-06-23 ENCOUNTER — Ambulatory Visit: Admitting: Internal Medicine

## 2024-06-23 DIAGNOSIS — N3000 Acute cystitis without hematuria: Secondary | ICD-10-CM | POA: Diagnosis not present

## 2024-07-01 ENCOUNTER — Encounter: Payer: Self-pay | Admitting: Pulmonary Disease

## 2024-07-01 ENCOUNTER — Ambulatory Visit (INDEPENDENT_AMBULATORY_CARE_PROVIDER_SITE_OTHER): Admitting: Pulmonary Disease

## 2024-07-01 VITALS — BP 132/74 | HR 77 | Temp 97.6°F | Ht 66.0 in | Wt 181.6 lb

## 2024-07-01 DIAGNOSIS — R0602 Shortness of breath: Secondary | ICD-10-CM

## 2024-07-01 DIAGNOSIS — I5032 Chronic diastolic (congestive) heart failure: Secondary | ICD-10-CM | POA: Diagnosis not present

## 2024-07-01 DIAGNOSIS — J449 Chronic obstructive pulmonary disease, unspecified: Secondary | ICD-10-CM | POA: Diagnosis not present

## 2024-07-01 DIAGNOSIS — E119 Type 2 diabetes mellitus without complications: Secondary | ICD-10-CM | POA: Diagnosis not present

## 2024-07-01 DIAGNOSIS — E039 Hypothyroidism, unspecified: Secondary | ICD-10-CM | POA: Diagnosis not present

## 2024-07-01 NOTE — Patient Instructions (Signed)
 VISIT SUMMARY:  Today, we discussed your stage one COPD and evaluated your current condition. We also reviewed your recent pulmonary function tests and chest CT scan results. Additionally, we addressed your fluid retention and heart health.  YOUR PLAN:  -CHRONIC OBSTRUCTIVE PULMONARY DISEASE (COPD), STAGE 1: Stage 1 COPD is a mild form of chronic lung disease that causes breathing difficulties. You experience intermittent shortness of breath, and Albuterol  helps during these episodes. You have been prescribed Symbicort to prevent exacerbations. Please start using Symbicort, two puffs twice daily, with one minute between puffs, and remember to rinse your mouth after using it. Continue using Albuterol  as needed for shortness of breath.  -HEART FAILURE WITH FLUID RETENTION: Heart failure can cause fluid to build up in your body. Your chest CT scan showed fluid retention, likely due to heart failure. You are currently taking furosemide  and potassium supplements to manage this condition. Please continue taking these medications as prescribed.  INSTRUCTIONS:  Please start using Symbicort as directed and continue with your current medications. If you experience any new or worsening symptoms, please contact our office. Follow up with us  in 3 months for a re-evaluation.

## 2024-07-01 NOTE — Progress Notes (Signed)
 Subjective:    Patient ID: Valerie Vazquez, female    DOB: 03/16/1956, 68 y.o.   MRN: [id number]  Patient Care Team: [name], DO as PCP - General (Family Medicine) Darliss Rogue, MD as PCP - Cardiology (Cardiology) Pa, [facility])  Chief Complaint  Patient presents with   Shortness of Breath    Occasional shortness of breath on exertion and wheezing.     BACKGROUND/INTERVAL:Patient is a 68 year old former smoker who presents for follow-up of shortness of breath on exertion and occasional cough. Patient previously diagnosed with COPD.  She was initially evaluated on 02 June 2024.  HPI Discussed the use of AI scribe software for clinical note transcription with the patient, who gave verbal consent to proceed.  History of Present Illness   Valerie Vazquez is a 68 year old female with stage one COPD who presents for follow-up on the issue of dyspnea.  She experiences intermittent shortness of breath, sometimes even while sitting. Albuterol  is used as needed, providing relief during these episodes. She was prescribed Symbicort but has not yet started using it.  She has however obtained the inhaler.  Albuterol  remains for acute episodes of shortness of breath.  Recent pulmonary function tests were within normal limits. A high-resolution CT chest scan showed no evidence of pulmonary fibrosis or ILD but did show some very small bilateral pleural effusions consistent with volume overload. She is currently taking furosemide  for fluid management.  She follows with cardiology for issues with chronic diastolic heart failure.  She denies any sleep problems and takes olanzapine  to aid sleep, which she finds effective. No shortness of breath during sleep, and she denies snoring or other sleep disturbances.  She has not received a flu shot and prefers not to get one.     DATA 06/09/2024 CT chest high-res: No evidence of interstitial lung disease.  Tiny bilateral  pleural effusions.  Probable mild basilar air trapping which can be seen with small airways disease.  Coronary artery calcification.  Enlarged pulmonary trunk indicative of pulmonary arterial hypertension. 06/22/2024 PFTs: FEV1 is 1.92 L or 78% predicted, FVC is 2.39 L or 73% predicted, FEV1/FVC was 80%, no bronchodilator response.  Lung volumes normal.  Diffusion is normal.  The FEV1 and FVC were reduced likely due to effort.  Overall normal study.  Review of Systems A 10 point review of systems was performed and it is as noted above otherwise negative.   Patient Active Problem List   Diagnosis Date Noted   Diabetic retinopathy of right eye associated with type 2 diabetes mellitus (HCC) 05/16/2024   Neurogenic claudication due to lumbar spinal stenosis 04/13/2024   Aortic atherosclerosis 04/13/2024   Near syncope 03/29/2024   Orthostatic hypotension 03/29/2024   Vaginal discharge 01/23/2024   Urethral prolapse 01/23/2024   Nocturia 01/23/2024   Incontinence of feces 01/22/2024   History of recurrent UTI (urinary tract infection) 01/22/2024   Decreased glomerular filtration rate (GFR) 01/21/2024   Influenza A 10/12/2023   Spondylolisthesis at L4-L5 level 07/27/2023   Left leg numbness 05/20/2023   Thoracic back pain 03/13/2023   Obesity (BMI 30.0-34.9) 12/10/2022   Subcutaneous nodule 06/09/2022   Numbness of right foot 03/05/2022   OAB (overactive bladder) 03/05/2022   Ingrown fingernail 12/27/2021   Cramps, extremity 11/27/2021   Skin lesion of lower extremity- right knee  09/26/2021   Retention cyst of nasal sinus 08/27/2021   Rash 08/27/2021   Bulimia (HCC) 01/14/2021   Cervical radiculitis  11/15/2020   Right arm pain 11/12/2020   Bilateral low back pain without sciatica 06/20/2020   Paronychia of great toe of right foot 04/05/2020   Paronychia of great toe, right 02/27/2020   Foraminal stenosis of cervical region 01/31/2020   Cervical fusion syndrome (C5-C7 2014)  01/31/2020   Ingrown toenail 01/27/2020   Chronic diastolic heart failure (HCC) 01/27/2020   Postmenopausal estrogen deficiency 08/12/2019   Left wrist pain 05/20/2019   Allergic rhinitis 01/27/2019   Pain of right lower extremity 10/06/2018   Mixed stress and urge urinary incontinence 10/27/2017   Elevated alkaline phosphatase level 10/27/2017   Hypothyroidism 08/26/2017   Type 2 diabetes mellitus with other specified complication (HCC) 05/29/2017   Right upper quadrant pain 01/12/2017   Plantar fasciitis of left foot 11/07/2016   CAD (coronary artery disease) 09/28/2016   Epigastric discomfort 09/16/2016   Schizoaffective disorder, depressive type (HCC) 08/28/2016   Cervicalgia 01/29/2016   Chronic low back pain (Location of Secondary source of pain) (Bilateral) (R>L) 01/29/2016   Chronic pain syndrome 01/29/2016   Primary osteoarthritis of right hip 01/29/2016   Lumbar central spinal stenosis (moderate at L4-5; mild at L2-3 and L3-4) 01/29/2016   Breast pain 10/19/2015   Abdominal pain 10/14/2015   Cough 07/31/2015   Esophageal spasm 06/05/2015   History of DVT (deep vein thrombosis) 02/25/2015   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   Constipation 12/12/2014   Coronary vasospasm 12/09/2014   History of pulmonary embolus (PE) 09/19/2014   Anxiety and depression 04/13/2014   IBS (irritable bowel syndrome) 04/11/2014   GERD (gastroesophageal reflux disease) 04/11/2014   Insomnia 04/11/2014   Hypertension associated with type 2 diabetes mellitus (HCC) 10/28/2013   Anemia 10/28/2013   Hyperlipidemia 10/28/2013    Social History   Tobacco Use   Smoking status: Former    Current packs/day: 0.00    Average packs/day: 1.5 packs/day for 10.0 years (15.0 ttl pk-yrs)    Types: Cigarettes    Start date: 04/11/1969    Quit date: 04/12/1979    Years since quitting: 45.2   Smokeless tobacco: Never  Substance Use Topics   Alcohol use: No    Alcohol/week: 0.0 standard drinks of alcohol     Allergies  Allergen Reactions   Other Hives, Itching and Swelling    Hair Dye - Causes severe sleepiness    Abilify [Aripiprazole] Other (See Comments)    Jerking movement, slow motor skills   Augmentin [Amoxicillin-Pot Clavulanate] Diarrhea and Nausea And Vomiting   Bactrim [Sulfamethoxazole-Trimethoprim] Diarrhea   Ceftin [Cefuroxime Axetil] Diarrhea   Clavulanic Acid    Coconut (Cocos Nucifera)     Rash    Diclofenac  Other (See Comments)     Muscle Pain   Indocin [Indomethacin] Other (See Comments)    Migraines    Linaclotide Diarrhea   Lisinopril Diarrhea and Other (See Comments)    Dizziness, Vomiting   Morphine  Other (See Comments)    Chest Tightness   Morphine  And Codeine  Other (See Comments)    Chest tightness    Simvastatin Other (See Comments)    Muscle Pain   Sulfa Antibiotics Diarrhea   Wellbutrin [Bupropion] Other (See Comments)    Jerking movement, slurred speech   Zetia  [Ezetimibe ]     Diarrhea    Seroquel [Quetiapine Fumarate] Palpitations    Current Meds  Medication Sig   albuterol  (VENTOLIN  HFA) 108 (90 Base) MCG/ACT inhaler TAKE 2 PUFFS BY MOUTH EVERY 6 HOURS AS NEEDED FOR WHEEZE OR SHORTNESS OF BREATH  b complex vitamins capsule Take 1 capsule by mouth daily.   baclofen  (LIORESAL ) 10 MG tablet Take 1 tablet (10 mg total) by mouth 3 (three) times daily as needed for muscle spasms.   blood glucose meter kit and supplies KIT Dispense based on patient and insurance preference. Use once daily fasting. Dx code E11.9.   Blood Glucose Monitoring Suppl (GHT BLOOD GLUCOSE MONITOR) w/Device KIT DISPENSE BASED ON PATIENT AND INSURANCE PREFERENCE. USE ONCE DAILY AS DIRECTED. DX CODE E11.9.   budesonide-formoterol (SYMBICORT) 80-4.5 MCG/ACT inhaler Inhale 2 puffs into the lungs 2 (two) times daily.   dicyclomine  (BENTYL ) 20 MG tablet TAKE ONE TABLET BY MOUTH THREE TIMES DAILY AS NEEDED FOR ABDOMINAL SPASMS (VIAL)   ELIQUIS  5 MG TABS tablet TAKE 1 TABLET BY  MOUTH TWICE A DAY   [START ON 07/18/2024] empagliflozin  (JARDIANCE ) 10 MG TABS tablet Take 1 tablet (10 mg total) by mouth daily before breakfast.   escitalopram  (LEXAPRO ) 20 MG tablet TAKE ONE TABLET BY MOUTH DAILY AT 9AM   estradiol  (ESTRACE ) 0.1 MG/GM vaginal cream Place 0.5 g vaginally 2 (two) times a week. Place 0.5g nightly for two weeks then twice a week after   famotidine  (PEPCID ) 20 MG tablet TAKE 1 TABLET BY MOUTH EVERYDAY AT BEDTIME   ferrous sulfate  325 (65 FE) MG tablet Take 325 mg by mouth daily with breakfast.   fluticasone  (FLONASE ) 50 MCG/ACT nasal spray SPRAY 2 SPRAYS INTO EACH NOSTRIL EVERY DAY   furosemide  (LASIX ) 20 MG tablet TAKE ONE TABLET BY MOUTH DAILY AS NEEDED FOR EDEMA (VIAL)   gabapentin  (NEURONTIN ) 600 MG tablet TAKE 1 TABLET BY MOUTH DAILY AT 9AM, AND 1 AND 1/2 TABS AT 9PM   levothyroxine  (SYNTHROID ) 25 MCG tablet TAKE ONE TABLET BY MOUTH DAILY AT 7AM BEFORE BREAKFAST AND 2-3 HOURS BEFORE TAKING ANY OTHER MEDICATION OR SUPPLEMENT   Magnesium  Citrate (MAGNESIUM  GUMMIES PO) Take 200 mg by mouth daily.   Multiple Vitamins-Minerals (MULTIVITAMIN GUMMIES ADULT) CHEW Chew 2 each by mouth daily.   nitroGLYCERIN  (NITROSTAT ) 0.4 MG SL tablet Place 1 tablet (0.4 mg total) under the tongue every 5 (five) minutes as needed for chest pain.   OLANZapine  (ZYPREXA ) 10 MG tablet TAKE 1 TABLET BY MOUTH EVERYDAY AT BEDTIME   ondansetron  (ZOFRAN -ODT) 4 MG disintegrating tablet TAKE 1 TABLET BY MOUTH EVERY 6 HOURS AS NEEDED FOR NAUSEA   pantoprazole  (PROTONIX ) 40 MG tablet TAKE 1 TABLET BY MOUTH EVERY DAY   potassium chloride  SA (KLOR-CON  M) 20 MEQ tablet TAKE ONE TABLET BY MOUTH DAILY AS NEEDED TAKE WITH LASIX  (VIAL) (Patient taking differently: 20 mEq as needed. TAKE ONE TABLET BY MOUTH DAILY AS NEEDED TAKE WITH LASIX  (VIAL))   Probiotic Product (PROBIOTIC BLEND) CAPS Take 1 capsule by mouth daily.   rosuvastatin  (CRESTOR ) 40 MG tablet Take 1 tablet (40 mg total) by mouth at bedtime.    Semaglutide ,0.25 or 0.5MG /DOS, (OZEMPIC , 0.25 OR 0.5 MG/DOSE,) 2 MG/3ML SOPN Inject 0.5 mg into the skin once a week.   senna (SENOKOT) 8.6 MG tablet Take 1 tablet by mouth daily.   sucralfate  (CARAFATE ) 1 g tablet TAKE 1 TABLET (1 G TOTAL) BY MOUTH 3 TIMES A DAY BEFORE MEALS   traMADol  (ULTRAM ) 50 MG tablet TAKE 1 TABLET (50 MG TOTAL) BY MOUTH EVERY 12 (TWELVE) HOURS AS NEEDED FOR MODERATE PAIN (PAIN SCORE 4-6).   Vibegron  (GEMTESA ) 75 MG TABS Take 1 tablet (75 mg total) by mouth daily.   vitamin C (ASCORBIC ACID) 250 MG tablet  Take 250 mg by mouth daily.   Zinc  Citrate (ZINC  GUMMY PO) Take 2 each by mouth daily.    Immunization History  Administered Date(s) Administered   PNEUMOCOCCAL CONJUGATE-20 08/27/2021        Objective:     BP 132/74   Pulse 77   Temp 97.6 F (36.4 C) (Temporal)   Ht 5' 6 (1.676 m)   Wt 181 lb 9.6 oz (82.4 kg)   SpO2 96%   BMI 29.31 kg/m   SpO2: 96 %  GENERAL: Well-developed, overweight woman, no acute distress.  Fully ambulatory.  No conversational dyspnea. HEAD: Normocephalic, atraumatic.  EYES: Pupils equal, round, reactive to light.  No scleral icterus.  MOUTH: Dentition intact, oral mucosa moist.  No thrush. NECK: Supple. No thyromegaly. Trachea midline. No JVD.  No adenopathy. PULMONARY: Good air entry bilaterally.  No adventitious sounds. CARDIOVASCULAR: S1 and S2. Regular rate and rhythm.  No rubs, murmurs or gallops heard. ABDOMEN: Benign. MUSCULOSKELETAL: No joint deformity, no clubbing, no edema.  NEUROLOGIC: No overt focal deficit, no gait disturbance, speech is fluent. SKIN: Intact,warm,dry. PSYCH: Mood and behavior normal.  Recent Results (from the past 2160 hours)  Magnesium      Status: None   Collection Time: 04/05/24  2:40 PM  Result Value Ref Range   Magnesium  2.1 1.6 - 2.3 mg/dL  ECHOCARDIOGRAM COMPLETE     Status: None   Collection Time: 05/03/24 10:05 AM  Result Value Ref Range   AR max vel 1.89 cm2   AV Peak grad 7.7  mmHg   Ao pk vel 1.39 m/s   S' Lateral 3.00 cm   Area-P 1/2 2.40 cm2   AV Area VTI 1.95 cm2   AV Mean grad 4.0 mmHg   AV Area mean vel 2.01 cm2   Est EF 55 - 60%   HM DIABETES EYE EXAM     Status: Abnormal   Collection Time: 05/09/24  7:15 AM  Result Value Ref Range   HM Diabetic Eye Exam Retinopathy (A) No Retinopathy    Comment: abst by him  Comprehensive metabolic panel with GFR     Status: Abnormal   Collection Time: 05/18/24  2:13 PM  Result Value Ref Range   Glucose 78 70 - 99 mg/dL   BUN 5 (L) 8 - 27 mg/dL   Creatinine, Ser 9.20 0.57 - 1.00 mg/dL   eGFR 80 >40 fO/fpw/8.26   BUN/Creatinine Ratio 6 (L) 12 - 28   Sodium 144 134 - 144 mmol/L   Potassium 3.7 3.5 - 5.2 mmol/L   Chloride 110 (H) 96 - 106 mmol/L   CO2 21 20 - 29 mmol/L   Calcium  9.0 8.7 - 10.3 mg/dL   Total Protein 6.3 6.0 - 8.5 g/dL   Albumin 3.9 3.9 - 4.9 g/dL   Globulin, Total 2.4 1.5 - 4.5 g/dL   Bilirubin Total 0.3 0.0 - 1.2 mg/dL   Alkaline Phosphatase 118 49 - 135 IU/L    Comment:               **Please note reference interval change**   AST 32 0 - 40 IU/L   ALT 40 (H) 0 - 32 IU/L  Vitamin B12     Status: None   Collection Time: 05/18/24  2:13 PM  Result Value Ref Range   Vitamin B-12 734 232 - 1,245 pg/mL  Hemoglobin A1c     Status: Abnormal   Collection Time: 05/18/24  2:13 PM  Result Value Ref Range  Hgb A1c MFr Bld 5.8 (H) 4.8 - 5.6 %    Comment:          Prediabetes: 5.7 - 6.4          Diabetes: >6.4          Glycemic control for adults with diabetes: <7.0    Est. average glucose Bld gHb Est-mCnc 120 mg/dL  Glucose, capillary     Status: None   Collection Time: 06/08/24  6:42 AM  Result Value Ref Range   Glucose-Capillary 96 70 - 99 mg/dL    Comment: Glucose reference range applies only to samples taken after fasting for at least 8 hours.   Comment 1 IN EPIC   Pulmonary function test     Status: None   Collection Time: 06/22/24  3:21 PM  Result Value Ref Range   FVC-Pre 2.39 L    FVC-%Pred-Pre 73 %   FVC-Post 2.39 L   FVC-%Pred-Post 73 %   FVC-%Change-Post 0 %   FEV1-Pre 1.92 L   FEV1-%Pred-Pre 78 %   FEV1-Post 2.06 L   FEV1-%Pred-Post 83 %   FEV1-%Change-Post 7 %   FEV6-Pre 2.39 L   FEV6-%Pred-Pre 77 %   FEV6-Post 2.39 L   FEV6-%Pred-Post 76 %   FEV6-%Change-Post 0 %   Pre FEV1/FVC ratio 80 %   FEV1FVC-%Pred-Pre 105 %   Post FEV1/FVC ratio 86 %   FEV1FVC-%Change-Post 7 %   Pre FEV6/FVC Ratio 100 %   FEV6FVC-%Pred-Pre 104 %   Post FEV6/FVC ratio 100 %   FEV6FVC-%Pred-Post 104 %   FEF 25-75 Pre 1.92 L/sec   FEF2575-%Pred-Pre 95 %   FEF 25-75 Post 2.78 L/sec   FEF2575-%Pred-Post 137 %   FEF2575-%Change-Post 44 %   RV 2.24 L   RV % pred 98 %   TLC 4.64 L   TLC % pred 86 %   DLCO unc 17.15 ml/min/mmHg   DLCO unc % pred 83 %   DL/VA 5.48 ml/min/mmHg/L   DL/VA % pred 890 %  *Discussed pulmonary function testing with the patient.       Assessment & Plan:     ICD-10-CM   1. Shortness of breath  R06.02     2. Chronic diastolic heart failure (HCC)  P49.67     3. Stage 1 mild COPD by GOLD classification (HCC)  J44.9      Discussion:    Chronic obstructive pulmonary disease, stage 1 Stage 1 COPD with intermittent dyspnea. Pulmonary function tests are within normal limits. Chest CT shows no scarring but indicates fluid retention due to cardiac issues. Albuterol  provides relief during episodes. Symbicort prescribed to prevent exacerbations. - Initiate Symbicort, two puffs twice daily, with one minute between puffs. - Instructed to rinse mouth after using Symbicort. - Continue Albuterol  as needed for dyspnea.  Heart failure with fluid retention Fluid retention likely secondary to heart failure. Chest CT shows fluid retention. Currently on furosemide  and potassium supplements. - Continue furosemide  and potassium supplements.  - Continue follow-up with cardiology. - Consider right heart cath to determine right sided pressures.    Will see the  patient in follow-up in 6 months time she is to contact us  prior to that time should any new difficulties arise.   Advised if symptoms do not improve or worsen, to please contact office for sooner follow up or seek emergency care.    I spent 34 minutes of dedicated to the care of this patient on the date of this encounter to  include pre-visit review of records, face-to-face time with the patient discussing conditions above, post visit ordering of testing, clinical documentation with the electronic health record, making appropriate referrals as documented, and communicating necessary findings to members of the patients care team.     C. Leita Sanders, MD Advanced Bronchoscopy PCCM [facility]    *This note was generated using voice recognition software/Dragon and/or AI transcription program.  Despite best efforts to proofread, errors can occur which can change the meaning. Any transcriptional errors that result from this process are unintentional and may not be fully corrected at the time of dictation.

## 2024-07-06 NOTE — Telephone Encounter (Signed)
 Copied from CRM [id number]. Topic: Referral - Request for Referral >> Jul 06, 2024 11:20 AM Tinnie BROCKS wrote: Did the patient discuss referral with their provider in the last year? Yes  Patient felt very blown off by the provider she was referred to for pulmonary ([facility]). They did not help her and then asked her to come back in 6 months. She is requesting a new referral for the same problem (COPD) to be sent to a different provider. She says she has no preference. Requesting an update as soon as possible, believes she needs to see a dr quickly.   Can we respond through MyChart? Please call [phone]

## 2024-07-11 ENCOUNTER — Ambulatory Visit

## 2024-07-11 DIAGNOSIS — H029 Unspecified disorder of eyelid: Secondary | ICD-10-CM

## 2024-07-11 DIAGNOSIS — H5789 Other specified disorders of eye and adnexa: Secondary | ICD-10-CM

## 2024-07-11 NOTE — Progress Notes (Signed)
    Subjective   Valerie Vazquez is a 68 y.o. female who presents for the following: Lesion(s) of concern . Patient is new patient  Today patient reports: Area of concern below her right eye. She has a hx of a cyst there that drained on its own in the past. She states the area is not painful and has not drained recently.   Review of Systems:    No other skin or systemic complaints except as noted in HPI or Assessment and Plan.  The following portions of the chart were reviewed this encounter and updated as appropriate: medications, allergies, medical history  Relevant Medical History:  n/a   Objective  (SKPE) Well appearing patient in no apparent distress; mood and affect are within normal limits. 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 - Reassured patient that this is a benign, chronic condition related to tissue laxity  - Discussed treatment options including conservative vs surgical options. Discussed could refer to plastics/oculoplastics to further discuss surgical options - recommended gentle skin care, sun protection)   Procedures, orders, diagnosis for this visit:    There are no diagnoses linked to this encounter.  Return to clinic: Return if symptoms worsen or fail to improve.  I, Emerick Ege, CMA am acting as scribe for Lauraine JAYSON Kanaris, MD.   Documentation: I have reviewed the above documentation for accuracy and completeness, and I agree with the above.  Lauraine JAYSON Kanaris, MD

## 2024-07-11 NOTE — Patient Instructions (Signed)

## 2024-07-12 ENCOUNTER — Other Ambulatory Visit: Payer: Self-pay | Admitting: Neurosurgery

## 2024-07-12 DIAGNOSIS — M48062 Spinal stenosis, lumbar region with neurogenic claudication: Secondary | ICD-10-CM

## 2024-07-16 MED ORDER — SYMBICORT 80-4.5 MCG/ACT IN AERO: 2.0000 | INHALATION_SPRAY | Freq: Two times a day (BID) | RESPIRATORY_TRACT | 12 refills | Status: DC

## 2024-07-16 NOTE — Addendum Note (Signed)
 Addended by: DONZELLA DOMINO on: 07/16/2024 07:36 AM   Modules accepted: Orders

## 2024-07-18 ENCOUNTER — Telehealth: Payer: Self-pay | Admitting: Pulmonary Disease

## 2024-07-18 NOTE — Telephone Encounter (Signed)
 Received a new referral for patient. She was seen on 06/30/24 and has a return appointment in April. Trying to see if she is needing a sooner appointment, or what's a new pulmonologist.

## 2024-07-20 ENCOUNTER — Encounter: Payer: Self-pay | Admitting: Dietician

## 2024-07-20 ENCOUNTER — Encounter: Payer: Self-pay | Attending: Family Medicine | Admitting: Dietician

## 2024-07-20 VITALS — Wt 182.5 lb

## 2024-07-20 DIAGNOSIS — Z713 Dietary counseling and surveillance: Secondary | ICD-10-CM | POA: Diagnosis not present

## 2024-07-20 DIAGNOSIS — E1169 Type 2 diabetes mellitus with other specified complication: Secondary | ICD-10-CM

## 2024-07-20 DIAGNOSIS — E119 Type 2 diabetes mellitus without complications: Secondary | ICD-10-CM | POA: Diagnosis present

## 2024-07-20 NOTE — Patient Instructions (Addendum)
 Eat a small meal or snack every 3-4 hours during the day.   -- this helps keep digestive system healthy and allows the body to burn up calories better If blood sugar is low (<70), drink 4oz juice or regular soda, then recheck blood sugar. If better, eat a meal or snack within 30 minutes. If not better, repeat the juice.

## 2024-07-20 NOTE — Progress Notes (Signed)
 Diabetes Self-Management Education  Visit Type:  Follow-up  Appt. Start Time: 1100 Appt. End Time: 1200  07/20/2024  Ms. Valerie Vazquez, identified by name and date of birth, is a 68 y.o. female with a diagnosis of Diabetes:  .   ASSESSMENT  Weight 182 lb 8 oz (82.8 kg). Body mass index is 29.46 kg/m.    Diabetes Self-Management Education - 07/20/24 1109       Complications   How often do you check your blood sugar? 1-2 times/day    Fasting Blood glucose range (mg/dL) 29-870   usually 19d, had one reading of 66 with no symptoms     Dietary Intake   Breakfast skips if not hungry -- most days; occasionally juice    Lunch frozen meal or potpie or burrito from frozen; sometimes skips    Dinner 11/18 fried chicken gizzards and potato wedges ([employer]); often small portions; occasionally skips if not hungry    Snack (evening) likes v8 juice with lemon juice      Activity / Exercise   Activity / Exercise Type ADL's      Patient Education   Healthy Eating Meal timing in regards to the patients' current diabetes medication.;Meal options for control of blood glucose level and chronic complications.    Acute complications Taught prevention, symptoms, and  treatment of hypoglycemia - the 15 rule.      Patient Self-Evaluation of Goals - Patient rates self as meeting previously set goals (% of time)   Nutrition 25 - 50% (sometimes)      Post-Education Assessment   Patient understands the diabetes disease and treatment process. Comprehends key points    Patient understands incorporating nutritional management into lifestyle. Comprehends key points    Patient undertands incorporating physical activity into lifestyle. Comprehends key points    Patient understands using medications safely. Demonstrates understanding / competency    Patient understands monitoring blood glucose, interpreting and using results Demonstrates understanding / competency    Patient understands prevention, detection,  and treatment of acute complications. Comprehends key points    Patient understands prevention, detection, and treatment of chronic complications. Comprehends key points    Patient understands how to develop strategies to address psychosocial issues. Comprehends key points    Patient understands how to develop strategies to promote health/change behavior. Demonstrates understanding / competency      Outcomes   Program Status Completed          Learning Objective:  Patient will have a greater understanding of diabetes self-management. Patient education plan is to attend individual and/or group sessions per assessed needs and concerns.   Plan:   Patient Instructions  Eat a small meal or snack every 3-4 hours during the day.   -- this helps keep digestive system healthy and allows the body to burn up calories better If blood sugar is low (<70), drink 4oz juice or regular soda, then recheck blood sugar. If better, eat a meal or snack within 30 minutes. If not better, repeat the juice.    Expected Outcomes:  Demonstrated interest in learning. Expect positive outcomes  Education material provided: Snacking handout; turkey recipe  If problems or questions, patient to contact team via:  Phone  Future DSME appointment: - PRN

## 2024-07-21 ENCOUNTER — Ambulatory Visit
Admission: RE | Admit: 2024-07-21 | Discharge: 2024-07-21 | Disposition: A | Discharge status: Home / Self Care | Source: Ambulatory Visit | Attending: Neurosurgery | Admitting: Neurosurgery

## 2024-07-21 DIAGNOSIS — M48062 Spinal stenosis, lumbar region with neurogenic claudication: Secondary | ICD-10-CM

## 2024-07-21 NOTE — Telephone Encounter (Signed)
 Pt is asking to see another provider. Dr. Tamea are you willing to have her seen by another provider?

## 2024-07-25 ENCOUNTER — Ambulatory Visit: Admitting: Neurology

## 2024-08-02 ENCOUNTER — Ambulatory Visit: Admitting: Obstetrics

## 2024-08-03 ENCOUNTER — Telehealth (HOSPITAL_BASED_OUTPATIENT_CLINIC_OR_DEPARTMENT_OTHER): Payer: Self-pay

## 2024-08-03 NOTE — Telephone Encounter (Signed)
   Pre-operative Risk Assessment    Patient Name: Valerie Vazquez  DOB: 1956/07/15 MRN: [id number]   Date of last office visit: 05/12/24 with Franc[place]ster Date of next office visit: 11/14/24 with Agbor-Etang  Request for Surgical Clearance    Procedure:  Colonoscopy  Date of Surgery:  Clearance 09/14/24                                 Surgeon:  Dr. Therisa Socks Group or Practice Name:  [facility] Phone number:  [phone] Fax number:  [phone]   Type of Clearance Requested:   - Medical  - Pharmacy:  Hold Apixaban  (Eliquis ) not indicated   Type of Anesthesia:  propofol    Additional requests/questions:    [name]   08/03/2024, 11:14 AM

## 2024-08-03 NOTE — Telephone Encounter (Signed)
   Name: Valerie Vazquez  DOB: 09-06-1955  MRN: [id number]  Primary Cardiologist: Redell Cave, MD   Preoperative team, please contact this patient and set up a phone call appointment for further preoperative risk assessment. Please obtain consent and complete medication review. Thank you for your help.  I confirm that guidance regarding antiplatelet and oral anticoagulation therapy has been completed and, if necessary, noted below.  Patient is on Eliquis  for prior DVT/ PE not for a cardiac reason. Therefore, will defer recommendations for holding this to PCP or prescribing provider.   I also confirmed the patient resides in the state of [place] . As per [facility] Medical Board telemedicine laws, the patient must reside in the state in which the provider is licensed.   Damien JAYSON Braver, NP 08/03/2024, 12:25 PM [facility]

## 2024-08-03 NOTE — Telephone Encounter (Signed)
 Correction to my note, clearance will be on 09/05/24 NOT 09/06/23, please advise on this error.

## 2024-08-03 NOTE — Telephone Encounter (Signed)
 Patient scheduled for pre-op clearance on 09/06/23. Unsure of who will be in pre-op that day due to our schedule not released just yet.

## 2024-08-05 ENCOUNTER — Other Ambulatory Visit: Payer: Self-pay | Admitting: Family Medicine

## 2024-08-05 DIAGNOSIS — G8929 Other chronic pain: Secondary | ICD-10-CM

## 2024-08-08 ENCOUNTER — Other Ambulatory Visit: Payer: Self-pay

## 2024-08-08 DIAGNOSIS — R110 Nausea: Secondary | ICD-10-CM

## 2024-08-08 NOTE — Telephone Encounter (Signed)
 Please review in provider's absence.   LOV- 05/18/2024 NOV- 08/17/2024 LRF- 06/07/2024 Outpatient Medication Detail   Disp Refills Start End   traMADol  (ULTRAM ) 50 MG tablet 60 tablet 0 06/07/2024 --   Sig - Route: TAKE 1 TABLET (50 MG TOTAL) BY MOUTH EVERY 12 (TWELVE) HOURS AS NEEDED FOR MODERATE PAIN (PAIN SCORE 4-6). - Oral   Sent to pharmacy as: traMADol  (ULTRAM ) 50 MG tablet   Notes to Pharmacy: Not to exceed 5 additional fills before 08/20/2024   E-Prescribing Status: Receipt confirmed by pharmacy (06/07/2024  4:59 PM EDT)

## 2024-08-09 ENCOUNTER — Ambulatory Visit: Admitting: Neurology

## 2024-08-10 ENCOUNTER — Other Ambulatory Visit: Payer: Self-pay

## 2024-08-10 DIAGNOSIS — K2970 Gastritis, unspecified, without bleeding: Secondary | ICD-10-CM

## 2024-08-10 DIAGNOSIS — R110 Nausea: Secondary | ICD-10-CM

## 2024-08-11 ENCOUNTER — Other Ambulatory Visit: Payer: Self-pay | Admitting: Family Medicine

## 2024-08-11 ENCOUNTER — Telehealth: Payer: Self-pay

## 2024-08-11 ENCOUNTER — Encounter: Payer: Self-pay | Admitting: Family Medicine

## 2024-08-11 ENCOUNTER — Other Ambulatory Visit: Payer: Self-pay

## 2024-08-11 DIAGNOSIS — K2970 Gastritis, unspecified, without bleeding: Secondary | ICD-10-CM

## 2024-08-11 DIAGNOSIS — J341 Cyst and mucocele of nose and nasal sinus: Secondary | ICD-10-CM

## 2024-08-11 MED ORDER — SUCRALFATE 1 G PO TABS: 1.0000 g | ORAL_TABLET | Freq: Three times a day (TID) | ORAL | 2 refills | Status: AC

## 2024-08-11 NOTE — Telephone Encounter (Signed)
 Refill sent

## 2024-08-11 NOTE — Telephone Encounter (Signed)
 Copied from CRM #[id number]. Topic: Clinical - Prescription Issue >> Aug 10, 2024 10:22 AM [place] SAUNDERS wrote: From previous Reason for Contact - Prescription Issue: Reason for CRM:    sucralfate  (CARAFATE ) 1 g tablet    - Previously seeing [facility], medications listed as expired in Mychart. Pharmacy reaching out to wrong provider. Office visit on 12/17, would like to review medications and understand which ones she can prescribe.   - currently OK with medications currently prescribed by PCP. ----------------------------------------------------------------------- From previous Reason for Contact - Medication Refill: Medication:   Has the patient contacted their pharmacy?   (Agent: If no, request that the patient contact the pharmacy for the refill. If patient does not wish to contact the pharmacy document the reason why and proceed with request.) (Agent: If yes, when and what did the pharmacy advise?)  This is the patient's preferred pharmacy:  [id number] - [place], [place] - [address] [place] [address] Phone: [phone] Fax: [phone]  Is this the correct pharmacy for this prescription?   If no, delete pharmacy and type the correct one.   Has the prescription been filled recently?    Is the patient out of the medication?    Has the patient been seen for an appointment in the last year OR does the patient have an upcoming appointment?    Can we respond through MyChart?    Agent: Please be advised that Rx refills may take up to 3 business days. We ask that you follow-up with your pharmacy.

## 2024-08-15 ENCOUNTER — Telehealth: Payer: Self-pay

## 2024-08-15 NOTE — Telephone Encounter (Signed)
 Copied from CRM #[id number]. Topic: Clinical - Prescription Issue >> Aug 15, 2024  1:56 PM Valerie Vazquez wrote: Reason for CRM: Patient called.. said ondansetron  (ZOFRAN -ODT) 4 MG disintegrating tablet was denied?  She does have an appt Wednesday.. Does she need to wait to get it filled or can we fill it before the appt?  [facility] Pls [facility] patient. ( No rush - she does have a few left )

## 2024-08-17 ENCOUNTER — Encounter: Payer: Self-pay | Admitting: Family Medicine

## 2024-08-17 ENCOUNTER — Ambulatory Visit: Admitting: Family Medicine

## 2024-08-17 VITALS — BP 95/59 | HR 78 | Temp 98.0°F | Ht 66.0 in | Wt 179.3 lb

## 2024-08-17 DIAGNOSIS — R918 Other nonspecific abnormal finding of lung field: Secondary | ICD-10-CM

## 2024-08-17 DIAGNOSIS — Z7984 Long term (current) use of oral hypoglycemic drugs: Secondary | ICD-10-CM

## 2024-08-17 DIAGNOSIS — I152 Hypertension secondary to endocrine disorders: Secondary | ICD-10-CM

## 2024-08-17 DIAGNOSIS — J410 Simple chronic bronchitis: Secondary | ICD-10-CM | POA: Diagnosis not present

## 2024-08-17 DIAGNOSIS — Z7985 Long-term (current) use of injectable non-insulin antidiabetic drugs: Secondary | ICD-10-CM

## 2024-08-17 DIAGNOSIS — I5032 Chronic diastolic (congestive) heart failure: Secondary | ICD-10-CM

## 2024-08-17 DIAGNOSIS — E063 Autoimmune thyroiditis: Secondary | ICD-10-CM

## 2024-08-17 DIAGNOSIS — E1159 Type 2 diabetes mellitus with other circulatory complications: Secondary | ICD-10-CM | POA: Diagnosis not present

## 2024-08-17 MED ORDER — ONDANSETRON 4 MG PO TBDP: 4.0000 mg | ORAL_TABLET | Freq: Four times a day (QID) | ORAL | 2 refills | Status: AC | PRN

## 2024-08-17 MED ORDER — OZEMPIC (0.25 OR 0.5 MG/DOSE) 2 MG/3ML ~~LOC~~ SOPN: 0.5000 mg | PEN_INJECTOR | SUBCUTANEOUS | 2 refills | Status: AC

## 2024-08-17 MED ORDER — EMPAGLIFLOZIN 10 MG PO TABS: 10.0000 mg | ORAL_TABLET | Freq: Every day | ORAL | 0 refills | Status: AC

## 2024-08-17 MED ORDER — LEVOTHYROXINE SODIUM 25 MCG PO TABS: ORAL_TABLET | ORAL | 0 refills | Status: AC

## 2024-08-17 NOTE — Addendum Note (Signed)
 Addended by: DONZELLA DOMINO on: 08/17/2024 09:08 AM   Modules accepted: Orders

## 2024-08-17 NOTE — Telephone Encounter (Signed)
 No problem, hope you have a wonderful day!

## 2024-08-17 NOTE — Progress Notes (Signed)
 "     Established patient visit   Patient: Valerie Vazquez   DOB: 1955/09/21   68 y.o. Female  MRN: [id number] Visit Date: 08/17/2024  Today's healthcare provider: LAURAINE LOISE BUOY, DO   Chief Complaint  Patient presents with   Follow-up    Patient is supposed to follow up in 8 weeks, wanting a second opinion on her lungs.  States that her right lung is all bunched up and states that it is not supposed to look like that.    Reports that she ate chicken pot pie and it was so hot, when she swallowed it just stayed there so wants her to look at her throat because she states that it was burning.  Neurosurgeon is wanting to do another surgery for her back.   Subjective    HPI Valerie Vazquez is a 68 year old female with chronic lung issues who presents for a follow-up regarding her pulmonary condition.  She is dissatisfied with her current pulmonologist and seeks a second opinion regarding her chronic lung condition. She describes a 'chronic blunting of the right costophrenic angle' observed on imaging, which she feels was not adequately addressed. She is concerned about the appearance of her lung on the x-ray or CT scan and questions the lack of treatment options provided.  She has a history of fluid on her lungs and uses Symbicort , two puffs twice a day, and albuterol  as needed for shortness of breath or wheezing. She is concerned about the possibility of cancer, but reports that her pulmonologist told her there were no nodules on her CT scan from October.  She mentions a history of diastolic heart failure, which she believes contributes to her symptoms of heart palpitations, described as her heart 'flipping', and occasional shortness of breath. She is currently taking multiple medications, including levothyroxine  and Ozempic , and has recently received refills for these medications.      Medications: Show/hide medication list[1]       Objective    BP (!) 95/59 (BP Location: Right  Arm, Patient Position: Sitting, Cuff Size: Large)   Pulse 78   Temp 98 F (36.7 C) (Oral)   Ht 5' 6 (1.676 m)   Wt 179 lb 4.8 oz (81.3 kg)   SpO2 99%   BMI 28.94 kg/m     Physical Exam Constitutional:      Appearance: Normal appearance.  HENT:     Head: Normocephalic and atraumatic.  Eyes:     General: No scleral icterus.    Extraocular Movements: Extraocular movements intact.     Conjunctiva/sclera: Conjunctivae normal.  Cardiovascular:     Rate and Rhythm: Normal rate and regular rhythm.     Pulses: Normal pulses.     Heart sounds: Normal heart sounds.  Pulmonary:     Effort: Pulmonary effort is normal. No respiratory distress.     Breath sounds: Normal breath sounds.  Abdominal:     General: Bowel sounds are normal.     Palpations: Abdomen is soft.  Musculoskeletal:     Right lower leg: No edema.     Left lower leg: No edema.  Skin:    General: Skin is warm and dry.  Neurological:     Mental Status: She is alert and oriented to person, place, and time. Mental status is at baseline.  Psychiatric:        Mood and Affect: Mood normal.        Behavior: Behavior normal.  No results found for any visits on 08/17/24.  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  Dispense: 90 tablet; Refill: 0 -     Ozempic  (0.25 or 0.5 MG/DOSE); Inject 0.5 mg into the skin once a week.  Dispense: 3 mL; Refill: 2  Chronic diastolic heart failure (HCC) -     Empagliflozin ; Take 1 tablet (10 mg total) by mouth daily before breakfast.  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 No evidence of malignancy on recent CT scan.  Discussed this as well as the lack of need for treatment to address this, as no such treatment exists and it is not in and of itself a progressive problem. - Continue Symbicort  twice daily, two puffs. - Use albuterol  as needed for shortness of breath or wheezing. - Referred to [facility]  for a second opinion per patient preference.  Simple chronic bronchitis Managed with Symbicort  and albuterol  inhalers as noted.  No changes today. - Continue current inhaler regimen with Symbicort  and albuterol  as needed.  Hypertension associated with type 2 diabetes mellitus Chronic, stable.  Refilled Jardiance  and Ozempic  today as noted above.  No changes today.  Chronic diastolic heart failure Contributing to fluid overload and pleural effusions. Symptoms include palpitations and occasional shortness of breath. - Continue current heart failure management regimen, specifically Jardiance  and rosuvastatin , as patient could not tolerate additional medications due to hypotension and syncope/presyncope. - Follows with cardiology; defer to specialist management.  Hashimoto's thyroiditis Managed with levothyroxine  25 mcg daily.  No changes today. - Prescribed levothyroxine  refill.    Return in about 3 months (around 11/15/2024) for [facility] with Dr. Franchot.      I discussed the assessment and treatment plan with the patient  The patient was provided an opportunity to ask questions and all were answered. The patient agreed with the plan and demonstrated an understanding of the instructions.   The patient was advised to call back or seek an in-person evaluation if the symptoms worsen or if the condition fails to improve as anticipated.    LAURAINE LOISE BUOY, DO  [facility] [phone] (phone) [phone] (fax)  [facility]     [1]  Outpatient Medications Prior to Visit  Medication Sig   albuterol  (VENTOLIN  HFA) 108 (90 Base) MCG/ACT inhaler TAKE 2 PUFFS BY MOUTH EVERY 6 HOURS AS NEEDED FOR WHEEZE OR SHORTNESS OF BREATH   b complex vitamins capsule Take 1 capsule by mouth daily.   baclofen  (LIORESAL ) 10 MG tablet Take 1 tablet (10 mg total) by mouth 3 (three) times daily as needed for muscle spasms.   blood glucose meter kit and supplies KIT Dispense based on patient and insurance preference. Use once daily fasting. Dx code E11.9.   Blood Glucose Monitoring Suppl (GHT BLOOD GLUCOSE MONITOR) w/Device KIT  DISPENSE BASED ON PATIENT AND INSURANCE PREFERENCE. USE ONCE DAILY AS DIRECTED. DX CODE E11.9.   budesonide -formoterol  (SYMBICORT ) 160-4.5 MCG/ACT inhaler    dicyclomine  (BENTYL ) 20 MG tablet TAKE ONE TABLET BY MOUTH THREE TIMES DAILY AS NEEDED FOR ABDOMINAL SPASMS (VIAL)   ELIQUIS  5 MG TABS tablet TAKE 1 TABLET BY MOUTH TWICE A DAY   escitalopram  (LEXAPRO ) 20 MG tablet TAKE ONE TABLET BY MOUTH DAILY AT 9AM   estradiol  (ESTRACE ) 0.1 MG/GM vaginal cream Place 0.5 g vaginally 2 (two) times a week. Place 0.5g nightly for two weeks then twice a week after   famotidine  (PEPCID ) 20 MG tablet TAKE 1 TABLET BY MOUTH EVERYDAY AT BEDTIME   ferrous sulfate  325 (65 FE) MG tablet Take 325 mg by mouth daily with breakfast.   fluticasone  (FLONASE ) 50 MCG/ACT nasal spray SPRAY 2 SPRAYS INTO EACH NOSTRIL EVERY DAY   furosemide  (LASIX ) 20 MG tablet TAKE ONE TABLET BY MOUTH DAILY AS NEEDED FOR EDEMA (VIAL)   gabapentin  (NEURONTIN ) 600 MG tablet TAKE 1 TABLET BY MOUTH DAILY AT 9AM, AND 1 AND 1/2 TABS AT 9PM   Magnesium  Citrate (MAGNESIUM  GUMMIES PO) Take 200 mg by mouth daily.   Multiple Vitamins-Minerals (MULTIVITAMIN GUMMIES ADULT) CHEW Chew 2 each by mouth daily.   nitroGLYCERIN  (NITROSTAT ) 0.4 MG SL tablet  Place 1 tablet (0.4 mg total) under the tongue every 5 (five) minutes as needed for chest pain.   OLANZapine  (ZYPREXA ) 10 MG tablet TAKE 1 TABLET BY MOUTH EVERYDAY AT BEDTIME   ondansetron  (ZOFRAN -ODT) 4 MG disintegrating tablet Take 1 tablet (4 mg total) by mouth every 6 (six) hours as needed. for nausea   pantoprazole  (PROTONIX ) 40 MG tablet TAKE 1 TABLET BY MOUTH EVERY DAY   potassium chloride  SA (KLOR-CON  M) 20 MEQ tablet TAKE ONE TABLET BY MOUTH DAILY AS NEEDED TAKE WITH LASIX  (VIAL) (Patient taking differently: 20 mEq as needed. TAKE ONE TABLET BY MOUTH DAILY AS NEEDED TAKE WITH LASIX  (VIAL))   Probiotic Product (PROBIOTIC BLEND) CAPS Take 1 capsule by mouth daily.   rosuvastatin  (CRESTOR ) 40 MG tablet Take 1 tablet (40 mg total) by mouth at bedtime.   senna (SENOKOT) 8.6 MG tablet Take 1 tablet by mouth daily.   sucralfate  (CARAFATE ) 1 g tablet Take 1 tablet (1 g total) by mouth 4 (four) times daily -  with meals and at bedtime.   traMADol  (ULTRAM ) 50 MG tablet Take 1 tablet (50 mg total) by mouth every 12 (twelve) hours as needed.   Vibegron  (GEMTESA ) 75 MG TABS Take 1 tablet (75 mg total) by mouth daily.   vitamin C (ASCORBIC ACID) 250 MG tablet Take 250 mg by mouth daily.   Zinc  Citrate (ZINC  GUMMY PO) Take 2 each by mouth daily.   [DISCONTINUED] empagliflozin  (JARDIANCE ) 10 MG TABS tablet Take 1 tablet (10 mg total) by mouth daily before breakfast.   [DISCONTINUED] levothyroxine  (SYNTHROID ) 25 MCG tablet TAKE ONE TABLET BY MOUTH DAILY AT 7AM BEFORE BREAKFAST AND 2-3 HOURS BEFORE TAKING ANY OTHER MEDICATION OR SUPPLEMENT   [DISCONTINUED] Semaglutide ,0.25 or 0.5MG /DOS, (OZEMPIC , 0.25 OR 0.5 MG/DOSE,) 2 MG/3ML SOPN Inject 0.5 mg into the skin once a week.   No facility-administered medications prior to visit.   "

## 2024-08-27 ENCOUNTER — Other Ambulatory Visit: Payer: Self-pay

## 2024-08-27 DIAGNOSIS — R110 Nausea: Secondary | ICD-10-CM

## 2024-08-29 ENCOUNTER — Telehealth: Payer: Self-pay

## 2024-08-29 NOTE — Telephone Encounter (Signed)
 Pt would also like to know the cost

## 2024-08-29 NOTE — Telephone Encounter (Signed)
 Copied from CRM #[id number]. Topic: Appointments - Scheduling Inquiry for Clinic >> Aug 29, 2024 10:28 AM Harlene ORN wrote: Reason for CRM: PCP is leaving in April. Would like to transfer to Dr. Franchot. What insurance does she take? Please call back to discuss.

## 2024-08-29 NOTE — Telephone Encounter (Signed)
 Pt advised her request for a letter to be excused was sent to Dr.Pardue and that she should contact her insurance to verify Dr.Carter is in network with her plan. She verbalized understanding

## 2024-08-29 NOTE — Telephone Encounter (Signed)
 Copied from CRM #[id number]. Topic: General - Other >> Aug 29, 2024  9:40 AM Leonette SQUIBB wrote: Reason for CRM: Patient needs a note to be excused from jury duty.  She has heath issues with GI and joint problems.  The day is 09/08/24.  Needs to have the excuse before the 1st.  [phone]

## 2024-08-30 ENCOUNTER — Other Ambulatory Visit: Payer: Self-pay | Admitting: Family Medicine

## 2024-08-30 NOTE — Telephone Encounter (Signed)
 Patient picked up form on 08/30/24.

## 2024-08-30 NOTE — Telephone Encounter (Signed)
 Pt.notified

## 2024-09-02 NOTE — Progress Notes (Signed)
 "   Virtual Visit via Telephone Note   Because of [name] co-morbid illnesses, she is at least at moderate risk for complications without adequate follow up.  This format is felt to be most appropriate for this patient at this time.  Due to technical limitations with video connection (technology), today's appointment will be conducted as an audio only telehealth visit, and Valerie Vazquez verbally agreed to proceed in this manner.   All issues noted in this document were discussed and addressed.  No physical exam could be performed with this format.  Evaluation Performed:  Preoperative cardiovascular risk assessment _____________   Date:  09/05/2024   Patient ID:  Valerie Vazquez, DOB 1956/05/26, MRN [id number] Patient Location:  Home Provider location:   Office  Primary Care Provider:  [name], DO Primary Cardiologist:  Redell Cave, MD  Chief Complaint / Patient Profile  69 y.o. y/o female with a h/o schizoaffective disorder, diabetes, coronary vasospasm, hypertension, history of DVT on Eliquis  who is pending colonoscopy on 09/14/2024 with Dr. Therisa and presents today for telephonic preoperative cardiovascular risk assessment. History of Present Illness  Valerie Vazquez is a 69 y.o. female who presents via audio/video conferencing for a telehealth visit today.  Pt was last seen in cardiology clinic on 05/12/2024 by Cadence Franchester, PA.  At that time Valerie Vazquez was doing well, patient denied any further presyncope or syncopal episodes, reported she had been eating regularly and felt that her prior presyncope/syncope was related to skipping meals while on Ozempic .  The patient is now pending procedure as outlined above. Since her last visit, she has remained stable in the cardiac standpoint. She does note some baseline dyspnea on exertion from history of COPD, denies any changes. Today she denies chest pain, lower extremity edema, fatigue, palpitations, melena, hematuria,  hemoptysis, diaphoresis, weakness, presyncope, syncope, orthopnea, and PND.  Past Medical History    Past Medical History:  Diagnosis Date   Acute right-sided low back pain with right-sided sciatica 10/23/2017   Allergy    Anemia    Anginal pain    Bilateral ovarian cysts    Bilateral renal cysts    Bulimia nervosa (HCC) 05/07/2021   CAD (coronary artery disease) 09/28/2016   Cataract    They are still forming   Chest pain 10/14/2015   CHF (congestive heart failure) (HCC)    Diastolic   Chronic back pain    upper and lower (09/19/2014)   Chronic shoulder pain (Location of Primary Source of Pain) (Left) 01/29/2016   Clotting disorder 04/2001   Blood clots/ on Eliquis    Colitis 04/08/2016   Colon polyp 01/30/2017   COPD (chronic obstructive pulmonary disease) (HCC)    Mild case   Coronary vasospasm 12/09/2014   DDD (degenerative disc disease), cervical    a.) s/p ACDF C5-C7   Depression    DVT (deep venous thrombosis) (HCC)    Esophageal spasm    Essential hypertension 10/28/2013   Gallstones    Gastritis    Gastroenteritis    GERD (gastroesophageal reflux disease)    Heart disease 12/09/2016   High blood pressure    High cholesterol    History of blood transfusion 1985   related to hysterectomy   History of left heart catheterization (LHC) 12/09/2014   a.) LHC 12/09/2014: EF 50%; normal coronaries.   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 Rule out Paget's disease of the bone with blood work.    Hyperlipidemia 10/28/2013   Hypertension    Hypothyroidism    IBS (irritable bowel syndrome)    Long term current use of anticoagulant    a.) apixaban    Lumbar central spinal stenosis (moderate at L4-5; mild at L2-3 and L3-4) 01/29/2016   Migraine    a few times/yr (09/19/2014)    Myocardial infarction (HCC) 10/2010 X 3   while hospitalized   Neck pain 01/29/2016   Osteoarthritis    Peripheral vascular disease    DVT/PE. On Eliquis    Pleural effusion, bilateral 07/31/2015   Pneumonia 04/2014   PONV (postoperative nausea and vomiting)    PTSD (post-traumatic stress disorder)    Hx. None currently (2024)   Pulmonary embolism (HCC) 04/2001; 09/19/2014   after gallbladder OR;    Schizoaffective disorder, depressive type (HCC) 08/28/2016   Sleep apnea    mild (09/19/2014)   Snoring    a. sleep study 5/16: No OSA   SOB (shortness of breath) 07/31/2015   Spinal stenosis    Spondylosis    Suicidal ideation 08/27/2016   a.) seen in ED with approx 6 months of reported SI. Reported ingestion of unknown pills to end life; last ingestion was 3 days PTA; UDS (+) for TCAs. SI related to depression, friend (loss of friend), and social determinants   Type II diabetes mellitus (HCC)    dx'd in 2007; lost weight; no RX for ~ 4 yr now (09/19/2014)   Past Surgical History:  Procedure Laterality Date   ABDOMINAL HYSTERECTOMY  10/1983   Partial hysterectomy uterus and tubes, ovaries removed in 2024?   ANTERIOR CERVICAL DECOMP/DISCECTOMY FUSION  10/2012   in [place]  C5-C7    APPENDECTOMY  1985   BACK SURGERY     BREAST CYST EXCISION Right    BREAST EXCISIONAL BIOPSY Right YRS AGO   NEG   CARDIAC CATHETERIZATION   2000's; 2009   CHOLECYSTECTOMY  2002   COLONOSCOPY WITH PROPOFOL  N/A 12/12/2016   Procedure: COLONOSCOPY WITH PROPOFOL ;  Surgeon: [name], MD;  Location: [facility] ENDOSCOPY;  Service: Endoscopy;  Laterality: N/A;   COLONOSCOPY WITH PROPOFOL  N/A 02/17/2017   Procedure: COLONOSCOPY WITH PROPOFOL ;  Surgeon: Kung Ruel, MD;  Location: [facility] ENDOSCOPY;  Service: Gastroenterology;  Laterality: N/A;   COLONOSCOPY WITH PROPOFOL  N/A 11/04/2021   Procedure: COLONOSCOPY WITH PROPOFOL ;  Surgeon: Kung Ruel, MD;  Location: [facility] ENDOSCOPY;  Service: Gastroenterology;   Laterality: N/A;   CYSTOSCOPY N/A 05/15/2022   Procedure: CYSTOSCOPY;  Surgeon: Leonce Garnette BIRCH, MD;  Location: [facility] ORS;  Service: Gynecology;  Laterality: N/A;   ESOPHAGOGASTRODUODENOSCOPY N/A 03/02/2024   Procedure: EGD (ESOPHAGOGASTRODUODENOSCOPY);  Surgeon: Kung Ruel, MD;  Location: [facility] ENDOSCOPY;  Service: Gastroenterology;  Laterality: N/A;  DM also on OZEMPIC    ESOPHAGOGASTRODUODENOSCOPY N/A 06/08/2024   Procedure: EGD (ESOPHAGOGASTRODUODENOSCOPY);  Surgeon: Kung Ruel, MD;  Location: [facility] ENDOSCOPY;  Service: Gastroenterology;  Laterality: N/A;  DM-Eliquis    ESOPHAGOGASTRODUODENOSCOPY (EGD) WITH PROPOFOL  N/A 02/17/2017   Procedure: ESOPHAGOGASTRODUODENOSCOPY (EGD) WITH PROPOFOL ;  Surgeon: Kung Ruel, MD;  Location: [facility] ENDOSCOPY;  Service: Gastroenterology;  Laterality: N/A;   ESOPHAGOGASTRODUODENOSCOPY (EGD) WITH PROPOFOL  N/A 03/07/2019   Procedure: ESOPHAGOGASTRODUODENOSCOPY (EGD) WITH PROPOFOL ;  Surgeon: Kung Ruel, MD;  Location: [facility] ENDOSCOPY;  Service: Gastroenterology;  Laterality: N/A;   EXCISION/RELEASE BURSA HIP Right 12/1984  HERNIA REPAIR  1985   IVC FILTER REMOVAL     2020s   JOINT REPLACEMENT     Left hip also a right hip bursectomy with complications   LEFT HEART CATHETERIZATION WITH CORONARY ANGIOGRAM N/A 12/09/2014   Procedure: LEFT HEART CATHETERIZATION WITH CORONARY ANGIOGRAM;  Surgeon: Lonni JONETTA Cash, MD;  Location: [facility] CATH LAB;  Service: Cardiovascular;  Laterality: N/A;   PARTIAL HYSTERECTOMY  1985   ROBOTIC ASSISTED BILATERAL SALPINGO OOPHERECTOMY Bilateral 05/15/2022   Procedure: XI ROBOTIC ASSISTED BILATERAL OOPHORECTOMY;  Surgeon: Leonce Garnette JONETTA, MD;  Location: [facility] ORS;  Service: Gynecology;  Laterality: Bilateral;   SPINE SURGERY  07/2023   Also 10/2012 in [place] the neck, and i cant remember the other one 2022 or 05/2022?   TONSILLECTOMY AND ADENOIDECTOMY  ~ 1968   TOTAL HIP ARTHROPLASTY Left 04/06/2014   UMBILICAL HERNIA REPAIR  1985    VENA CAVA FILTER PLACEMENT  03/2014    Allergies  Allergies[1]  Home Medications    Prior to Admission medications  Medication Sig Start Date End Date Taking? Authorizing Provider  albuterol  (VENTOLIN  HFA) 108 (90 Base) MCG/ACT inhaler TAKE 2 PUFFS BY MOUTH EVERY 6 HOURS AS NEEDED FOR WHEEZE OR SHORTNESS OF BREATH 12/17/23   Marylynn Verneita CROME, MD  b complex vitamins capsule Take 1 capsule by mouth daily.    [provider]  baclofen  (LIORESAL ) 10 MG tablet Take 1 tablet (10 mg total) by mouth 3 (three) times daily as needed for muscle spasms. 06/16/24   [name], DO  blood glucose meter kit and supplies KIT Dispense based on patient and insurance preference. Use once daily fasting. Dx code E11.9. 08/27/21   Maribeth Camellia MATSU, MD  Blood Glucose Monitoring Suppl (GHT BLOOD GLUCOSE MONITOR) w/Device KIT DISPENSE BASED ON PATIENT AND INSURANCE PREFERENCE. USE ONCE DAILY AS DIRECTED. DX CODE E11.9. 01/14/21   Maribeth Camellia MATSU, MD  budesonide -formoterol  (SYMBICORT ) 160-4.5 MCG/ACT inhaler     [provider]  dicyclomine  (BENTYL ) 20 MG tablet TAKE ONE TABLET BY MOUTH THREE TIMES DAILY AS NEEDED FOR ABDOMINAL SPASMS (VIAL) 06/13/24   Pardue, Lauraine SAILOR, DO  ELIQUIS  5 MG TABS tablet TAKE 1 TABLET BY MOUTH TWICE A DAY 06/11/24   Pardue, Sarah N, DO  empagliflozin  (JARDIANCE ) 10 MG TABS tablet Take 1 tablet (10 mg total) by mouth daily before breakfast. 08/17/24   Pardue, Lauraine SAILOR, DO  escitalopram  (LEXAPRO ) 20 MG tablet TAKE ONE TABLET BY MOUTH DAILY AT 9AM 10/24/22   Maribeth Camellia MATSU, MD  estradiol  (ESTRACE ) 0.1 MG/GM vaginal cream Place 0.5 g vaginally 2 (two) times a week. Place 0.5g nightly for two weeks then twice a week after 01/25/24   Guadlupe Dull T, MD  famotidine  (PEPCID ) 20 MG tablet TAKE 1 TABLET BY MOUTH EVERYDAY AT BEDTIME 02/19/24   Pardue, Lauraine SAILOR, DO  ferrous sulfate  325 (65 FE) MG tablet Take 325 mg by mouth daily with breakfast.    [provider]   fluticasone  (FLONASE ) 50 MCG/ACT nasal spray SPRAY 2 SPRAYS INTO EACH NOSTRIL EVERY DAY 08/11/24   Pardue, Lauraine SAILOR, DO  furosemide  (LASIX ) 20 MG tablet TAKE ONE TABLET BY MOUTH DAILY AS NEEDED FOR EDEMA (VIAL) 01/12/23   Maribeth Camellia MATSU, MD  gabapentin  (NEURONTIN ) 600 MG tablet TAKE 1 TABLET BY MOUTH DAILY AT 9AM, AND 1 AND 1/2 TABS AT 9PM 06/13/24   Pardue, Lauraine SAILOR, DO  levothyroxine  (SYNTHROID ) 25 MCG tablet TAKE ONE TABLET BY MOUTH DAILY AT  7AM BEFORE BREAKFAST AND 2-3 HOURS BEFORE TAKING ANY OTHER MEDICATION OR SUPPLEMENT 08/17/24   Pardue, Lauraine SAILOR, DO  Magnesium  Citrate (MAGNESIUM  GUMMIES PO) Take 200 mg by mouth daily.    [provider]  Multiple Vitamins-Minerals (MULTIVITAMIN GUMMIES ADULT) CHEW Chew 2 each by mouth daily.    [provider]  nitroGLYCERIN  (NITROSTAT ) 0.4 MG SL tablet Place 1 tablet (0.4 mg total) under the tongue every 5 (five) minutes as needed for chest pain. 06/26/23   Darliss Rogue, MD  OLANZapine  (ZYPREXA ) 10 MG tablet TAKE 1 TABLET BY MOUTH EVERYDAY AT BEDTIME 11/17/22   Maribeth Camellia MATSU, MD  ondansetron  (ZOFRAN -ODT) 4 MG disintegrating tablet Take 1 tablet (4 mg total) by mouth every 6 (six) hours as needed. for nausea 08/17/24   [name], DO  pantoprazole  (PROTONIX ) 40 MG tablet TAKE 1 TABLET BY MOUTH EVERY DAY 06/13/24   Pardue, Lauraine SAILOR, DO  potassium chloride  SA (KLOR-CON  M) 20 MEQ tablet TAKE ONE TABLET BY MOUTH DAILY AS NEEDED TAKE WITH LASIX  (VIAL) Patient taking differently: 20 mEq as needed. TAKE ONE TABLET BY MOUTH DAILY AS NEEDED TAKE WITH LASIX  (VIAL) 12/29/22   Maribeth Camellia MATSU, MD  Probiotic Product (PROBIOTIC BLEND) CAPS Take 1 capsule by mouth daily.    [provider]  rosuvastatin  (CRESTOR ) 40 MG tablet Take 1 tablet (40 mg total) by mouth at bedtime. 06/07/24   [name], DO  Semaglutide ,0.25 or 0.5MG /DOS, (OZEMPIC , 0.25 OR 0.5 MG/DOSE,) 2 MG/3ML SOPN Inject 0.5 mg into the skin once a week. 08/17/24    [name], DO  senna (SENOKOT) 8.6 MG tablet Take 1 tablet by mouth daily. 02/23/24   [provider]  sucralfate  (CARAFATE ) 1 g tablet Take 1 tablet (1 g total) by mouth 4 (four) times daily -  with meals and at bedtime. 08/11/24   Franchot Isaiah LABOR, MD  traMADol  (ULTRAM ) 50 MG tablet Take 1 tablet (50 mg total) by mouth every 12 (twelve) hours as needed. 08/08/24   Simmons-Robinson, Rockie, MD  Vibegron  (GEMTESA ) 75 MG TABS Take 1 tablet (75 mg total) by mouth daily. 04/28/24   Guadlupe Lianne DASEN, MD  vitamin C (ASCORBIC ACID) 250 MG tablet Take 250 mg by mouth daily.    [provider]  Zinc  Citrate (ZINC  GUMMY PO) Take 2 each by mouth daily.    [provider]    Physical Exam    Vital Signs:  DELOYCE WALTHERS does not have vital signs available for review today.  Given telephonic nature of communication, physical exam is limited. AAOx3. NAD. Normal affect.  Speech and respirations are unlabored.  Accessory Clinical Findings    None  Assessment & Plan    1.  Preoperative Cardiovascular Risk Assessment:  Ms. Hubka perioperative risk of a major cardiac event is 0.9% according to the Revised Cardiac Risk Index (RCRI).  Therefore, she is at low risk for perioperative complications.   Her functional capacity is good at 5.07 METs according to the [facility] Activity Status Index (DASI). Recommendations: According to ACC/AHA guidelines, no further cardiovascular testing needed.  The patient may proceed to surgery at acceptable risk.   Antiplatelet and/or Anticoagulation Recommendations: Patient is in Eliquis  for DVT/PE, not prescribed by cardiology.  Recommendations regarding holding Eliquis  will need to come from PCP or prescribing provider.  The patient was advised that if she develops new symptoms prior to surgery to contact our office to arrange for a follow-up visit, and she  verbalized understanding.  A copy of this note will be routed to requesting  surgeon.  Time:   Today, I have spent 10 minutes with the patient with telehealth technology discussing medical history, symptoms, and management plan.    Jonie Burdell D Adaleah Forget, NP  09/05/2024, 2:11 PM      [1]  Allergies Allergen Reactions   Other Hives, Itching and Swelling    Hair Dye - Causes severe sleepiness    Abilify [Aripiprazole] Other (See Comments)    Jerking movement, slow motor skills   Augmentin [Amoxicillin-Pot Clavulanate] Diarrhea and Nausea And Vomiting   Bactrim [Sulfamethoxazole-Trimethoprim] Diarrhea   Ceftin [Cefuroxime Axetil] Diarrhea   Clavulanic Acid    Coconut (Cocos Nucifera)     Rash    Diclofenac  Other (See Comments)     Muscle Pain   Indocin [Indomethacin] Other (See Comments)    Migraines    Linaclotide Diarrhea   Lisinopril Diarrhea and Other (See Comments)    Dizziness, Vomiting   Morphine  Other (See Comments)    Chest Tightness   Morphine  And Codeine  Other (See Comments)    Chest tightness    Simvastatin Other (See Comments)    Muscle Pain   Sulfa Antibiotics Diarrhea   Wellbutrin [Bupropion] Other (See Comments)    Jerking movement, slurred speech   Zetia  [Ezetimibe ]     Diarrhea    Seroquel [Quetiapine Fumarate] Palpitations   "

## 2024-09-05 ENCOUNTER — Ambulatory Visit: Attending: Cardiology

## 2024-09-05 DIAGNOSIS — Z01810 Encounter for preprocedural cardiovascular examination: Secondary | ICD-10-CM | POA: Diagnosis not present

## 2024-09-06 ENCOUNTER — Encounter: Payer: Self-pay | Admitting: Obstetrics

## 2024-09-06 ENCOUNTER — Ambulatory Visit: Admitting: Obstetrics

## 2024-09-06 ENCOUNTER — Telehealth: Payer: Self-pay

## 2024-09-06 VITALS — BP 105/72 | HR 84

## 2024-09-06 DIAGNOSIS — R159 Full incontinence of feces: Secondary | ICD-10-CM

## 2024-09-06 DIAGNOSIS — N3946 Mixed incontinence: Secondary | ICD-10-CM | POA: Diagnosis not present

## 2024-09-06 DIAGNOSIS — R351 Nocturia: Secondary | ICD-10-CM

## 2024-09-06 MED ORDER — GEMTESA 75 MG PO TABS: 1.0000 | ORAL_TABLET | Freq: Every day | ORAL | 11 refills | Status: AC

## 2024-09-06 NOTE — Assessment & Plan Note (Signed)
-   POCT catheterized urine + glucose only, HbA1C 5.7 on 01/21/24 - Cr 1.01 on 01/21/24, repeat 0.79  in 05/2024 - started after back surgery per pt with history of L4-5 spinal stenosis, left leg numbness postpartum, left fibula fracture - urgency > stress - We discussed the symptoms of overactive bladder (OAB), which include urinary urgency, urinary frequency, nocturia, with or without urge incontinence.  While we do not know the exact etiology of OAB, several treatment options exist. We discussed management including behavioral therapy (decreasing bladder irritants, urge suppression strategies, timed voids, bladder retraining), physical therapy, medication; for refractory cases posterior tibial nerve stimulation, sacral neuromodulation, and intravesical botulinum toxin injection.  For anticholinergic medications, we discussed the potential side effects of anticholinergics including dry eyes, dry mouth, constipation, cognitive impairment and urinary retention. For Beta-3 agonist medication, we discussed the potential side effect of elevated blood pressure which is more likely to occur in individuals with uncontrolled hypertension. - continue gemtesa  due to relief, continue to monitor LFTs - encouraged to maintain optimal glycemic control - encouraged to continue consistent low dose vaginal estrogen use 2xweel - For treatment of stress urinary incontinence,  non-surgical options include expectant management, weight loss, physical therapy, as well as a pessary.  Surgical options include a midurethral sling, Burch urethropexy, and transurethral injection of a bulking agent. - PFPT with orthostatic hypotension

## 2024-09-06 NOTE — Assessment & Plan Note (Signed)
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   - follow-up with Dr. Therisa (GI) fro additional treatments of constipation - encouarged titration of miralax  to minimize Bristol VI-VII stool with enema use, pt reports reduction of GI to 2-3x in 5 months down from daily symptoms - prior use of Linzess - orthostatic hypotension with pelvic floor PT

## 2024-09-06 NOTE — Telephone Encounter (Signed)
 Copied from CRM [id number]. Topic: Clinical - Medication Question >> Sep 05, 2024  3:48 PM Donna BRAVO wrote: Reason for CRM: Chiquita is asking for a letter regarding cardiac clearance from Dr Donzella who  prescribes  elequist ELIQUIS  5 MG TABS tablet  Chiquita needs to know long before the procedure to hold the medication.  Colonoscopy is scheduled for  09/14/24   Chiquita stated this is urgent  Please fax letter to [phone] att: Chiquita

## 2024-09-06 NOTE — Progress Notes (Signed)
 [facility] Return Visit  SUBJECTIVE  History of Present Illness: Valerie Vazquez is a 69 y.o. female seen in follow-up for mixed urinary incontinence, fecal incontinence, constipation, recurrent UTI, urethral prolapse, and nocturia. Plan at last visit was continue Gemtesa , vaginal estrogen, optimize glycemic control, titration of miralax .   Ambulates with walker Gemtesa  nightly with urgency urinary leakage 2x in the past 5 months only after delaying urge. Denies SUI  Baseline UUI 3-4x/day and SUI 1x/month, managed by 3-4 pads/day Voids 5-8x/day, 1-2x/night now 0x/night Vaginal estrogen stopped around 1 weeks ago, used 2x/week prior to last week. Reports reduction of vaginal lump since starting estrogen. Denies bleeding  Denies vaginal clear discharge or itching. Did not start titration of miralax , FI 2-3x in the past 5 months down from 1x/day Denies CPAP use or sleep apnea diagnosis  Underwent pelvic floor PT, reports discontinuation due to orthostatic hypotension Prior use of linzess, restarted with Dr. Therisa (GI) 08/02/24. Discontinued since possible due to nausea managed with zofran  PRN. Uses dicyclomine  PRN abdominal cramping, pending return for colonoscopy next week. Cr 1.01 in 03/31/24 down to 0.79 in 05/2024 Pending follow-up to consider back pain treatment such as repeat injection vs. L4-5 fusion due to pain from spondylolisthesis at L4-5 with chronic pain pain and LE paresthesia managed by Dr. Dow. S/p epidural injection 5/19, 7/12, and 11/22 in 2023  Past Medical History: Patient  has a past medical history of Acute right-sided low back pain with right-sided sciatica (10/23/2017), Allergy, Anemia, Anginal pain, Bilateral ovarian cysts, Bilateral renal cysts, Bulimia nervosa (HCC) (05/07/2021), CAD (coronary artery disease) (09/28/2016), Cataract, Chest pain (10/14/2015), CHF (congestive heart failure) (HCC), Chronic back pain, Chronic shoulder pain (Location of Primary  Source of Pain) (Left) (01/29/2016), Clotting disorder (04/2001), Colitis (04/08/2016), Colon polyp (01/30/2017), COPD (chronic obstructive pulmonary disease) (HCC), Coronary vasospasm (12/09/2014), DDD (degenerative disc disease), cervical, Depression, DVT (deep venous thrombosis) (HCC), Esophageal spasm, Essential hypertension (10/28/2013), Gallstones, Gastritis, Gastroenteritis, GERD (gastroesophageal reflux disease), Heart disease (12/09/2016), High blood pressure, High cholesterol, History of blood transfusion (1985), History of left heart catheterization (LHC) (12/09/2014), History of nuclear stress test, History of pulmonary embolus (PE) (09/19/2014), cervical spine surgery (01/29/2016), Hypercementosis (02/13/2015), Hyperlipidemia (10/28/2013), Hypertension, Hypothyroidism, IBS (irritable bowel syndrome), Long term current use of anticoagulant, Lumbar central spinal stenosis (moderate at L4-5; mild at L2-3 and L3-4) (01/29/2016), Migraine, Myocardial infarction (HCC) (10/2010 X 3), Neck pain (01/29/2016), Osteoarthritis, Peripheral vascular disease, Pleural effusion, bilateral (07/31/2015), Pneumonia (04/2014), PONV (postoperative nausea and vomiting), PTSD (post-traumatic stress disorder), Pulmonary embolism (HCC) (04/2001; 09/19/2014), Schizoaffective disorder, depressive type (HCC) (08/28/2016), Sleep apnea, Snoring, SOB (shortness of breath) (07/31/2015), Spinal stenosis, Spondylosis, Suicidal ideation (08/27/2016), and Type II diabetes mellitus (HCC).   Past Surgical History: She  has a past surgical history that includes Excision/release bursa hip (Right, 12/1984); Total hip arthroplasty (Left, 04/06/2014); Tonsillectomy and adenoidectomy (~ 1968); Appendectomy (1985); Cholecystectomy (2002); Hernia repair (1985); Umbilical hernia repair (1985); Vena cava filter placement (03/2014); Back surgery; Anterior cervical decomp/discectomy fusion (10/2012); Breast cyst excision (Right); Cardiac catheterization  ( 2000's; 2009); left heart catheterization with coronary angiogram (N/A, 12/09/2014); Colonoscopy with propofol  (N/A, 12/12/2016); Colonoscopy with propofol  (N/A, 02/17/2017); Esophagogastroduodenoscopy (egd) with propofol  (N/A, 02/17/2017); Esophagogastroduodenoscopy (egd) with propofol  (N/A, 03/07/2019); Breast excisional biopsy (Right, YRS AGO); Colonoscopy with propofol  (N/A, 11/04/2021); Robotic assisted bilateral salpingo oophorectomy (Bilateral, 05/15/2022); Cystoscopy (N/A, 05/15/2022); Partial hysterectomy (1985); IVC FILTER REMOVAL; Spine surgery (07/2023); Esophagogastroduodenoscopy (N/A, 03/02/2024); Abdominal hysterectomy (10/1983); Joint replacement; and Esophagogastroduodenoscopy (N/A, 06/08/2024).   Medications:  She has a current medication list which includes the following prescription(s): albuterol , b complex vitamins, baclofen , blood glucose meter kit and supplies, ght blood glucose monitor, budesonide -formoterol , dicyclomine , eliquis , empagliflozin , escitalopram , estradiol , famotidine , ferrous sulfate , fluticasone , furosemide , gabapentin , levothyroxine , magnesium  citrate, multivitamin gummies adult, nitroglycerin , olanzapine , ondansetron , pantoprazole , potassium chloride  sa, probiotic blend, rosuvastatin , ozempic  (0.25 or 0.5 mg/dose), senna, sucralfate , tramadol , vitamin c, zinc  citrate, and gemtesa .   Allergies: Patient is allergic to other, abilify [aripiprazole], augmentin [amoxicillin-pot clavulanate], bactrim [sulfamethoxazole-trimethoprim], ceftin [cefuroxime axetil], clavulanic acid, coconut (cocos nucifera), diclofenac , indocin [indomethacin], linaclotide, lisinopril, morphine , morphine  and codeine , simvastatin, sulfa antibiotics, wellbutrin [bupropion], zetia  [ezetimibe ], and seroquel [quetiapine fumarate].   Social History: Patient  reports that she quit smoking about 45 years ago. Her smoking use included cigarettes. She started smoking about 55 years ago. She has a 15  pack-year smoking history. She has never used smokeless tobacco. She reports that she does not drink alcohol and does not use drugs.     OBJECTIVE     Physical Exam: Vitals:   09/06/24 1033  BP: 105/72  Pulse: 84       ASSESSMENT AND PLAN    Ms. Benge is a 69 y.o. with:  1. Mixed stress and urge urinary incontinence   2. Incontinence of feces, unspecified fecal incontinence type   3. Nocturia      Mixed stress and urge urinary incontinence Assessment & Plan: - POCT catheterized urine + glucose only, HbA1C 5.7 on 01/21/24 - Cr 1.01 on 01/21/24, repeat 0.79  in 05/2024 - started after back surgery per pt with history of L4-5 spinal stenosis, left leg numbness postpartum, left fibula fracture - urgency > stress - We discussed the symptoms of overactive bladder (OAB), which include urinary urgency, urinary frequency, nocturia, with or without urge incontinence.  While we do not know the exact etiology of OAB, several treatment options exist. We discussed management including behavioral therapy (decreasing bladder irritants, urge suppression strategies, timed voids, bladder retraining), physical therapy, medication; for refractory cases posterior tibial nerve stimulation, sacral neuromodulation, and intravesical botulinum toxin injection.  For anticholinergic medications, we discussed the potential side effects of anticholinergics including dry eyes, dry mouth, constipation, cognitive impairment and urinary retention. For Beta-3 agonist medication, we discussed the potential side effect of elevated blood pressure which is more likely to occur in individuals with uncontrolled hypertension. - continue gemtesa  due to relief, continue to monitor LFTs - encouraged to maintain optimal glycemic control - encouraged to continue consistent low dose vaginal estrogen use 2xweel - For treatment of stress urinary incontinence,  non-surgical options include expectant management, weight loss, physical  therapy, as well as a pessary.  Surgical options include a midurethral sling, Burch urethropexy, and transurethral injection of a bulking agent. - PFPT with orthostatic hypotension  Orders: -     Gemtesa ; Take 1 tablet (75 mg total) by mouth daily.  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   - follow-up with Dr. Therisa (GI) fro additional treatments of constipation - encouarged titration of miralax  to minimize [place] VI-VII stool with enema use, pt reports reduction of GI to 2-3x in 5 months down from daily symptoms - prior use of Linzess - orthostatic hypotension with pelvic floor PT  Nocturia Assessment & Plan: - resolved on Gemteas. Rx to continue - history of sleep apnea and insomnia without CPAP use - leg swelling due to diastolic heart failure - avoid fluid intake 3 hours before bedtime - elevated feet during the day or use compression socks to reduce lower extremity swelling - switch diuretic (e.g. furosemide ) dosing to 2pm - encouraged CPAP for sleep apnea   Orders: -     Gemtesa ; Take 1 tablet (75 mg total) by mouth daily.  Dispense: 30 tablet; Refill: 11   Time spent: I spent 23 minutes dedicated to the care of this patient on the date of this encounter to include pre-visit review of records, face-to-face time with the patient discussing mixed urinary incontinence, recurrent UTI, fecal incontinence, and post visit documentation and ordering medication/ testing.   Lianne ONEIDA Gillis, MD

## 2024-09-06 NOTE — Assessment & Plan Note (Signed)
-   resolved on Gemteas. Rx to continue - history of sleep apnea and insomnia without CPAP use - leg swelling due to diastolic heart failure - avoid fluid intake 3 hours before bedtime - elevated feet during the day or use compression socks to reduce lower extremity swelling - switch diuretic (e.g. furosemide ) dosing to 2pm - encouraged CPAP for sleep apnea

## 2024-09-06 NOTE — Patient Instructions (Addendum)
 Continue Gemtesa  at night and vaginal estrogen 1g twice a week.   Continue vaginal estrogen 1g twice a week.   Continue to follow-up with Dr. Therisa regarding your bowel symptoms.  Please call if you experience any change vaginal or urinary symptoms.

## 2024-09-08 ENCOUNTER — Other Ambulatory Visit: Payer: Self-pay | Admitting: Family Medicine

## 2024-09-09 ENCOUNTER — Other Ambulatory Visit: Payer: Self-pay | Admitting: Family Medicine

## 2024-09-09 DIAGNOSIS — N6320 Unspecified lump in the left breast, unspecified quadrant: Secondary | ICD-10-CM

## 2024-09-09 DIAGNOSIS — Z1231 Encounter for screening mammogram for malignant neoplasm of breast: Secondary | ICD-10-CM

## 2024-09-12 ENCOUNTER — Encounter: Payer: Self-pay | Admitting: *Deleted

## 2024-09-14 ENCOUNTER — Other Ambulatory Visit: Payer: Self-pay

## 2024-09-14 ENCOUNTER — Ambulatory Visit: Admitting: Registered Nurse

## 2024-09-14 ENCOUNTER — Encounter
Admission: RE | Disposition: A | Discharge status: Home / Self Care | Payer: Self-pay | Source: Home / Self Care | Attending: Gastroenterology

## 2024-09-14 ENCOUNTER — Encounter: Payer: Self-pay | Admitting: Gastroenterology

## 2024-09-14 ENCOUNTER — Ambulatory Visit
Admission: RE | Admit: 2024-09-14 | Discharge: 2024-09-14 | Disposition: A | Discharge status: Home / Self Care | Attending: Gastroenterology | Admitting: Gastroenterology

## 2024-09-14 DIAGNOSIS — Z539 Procedure and treatment not carried out, unspecified reason: Secondary | ICD-10-CM | POA: Insufficient documentation

## 2024-09-14 DIAGNOSIS — Z1211 Encounter for screening for malignant neoplasm of colon: Secondary | ICD-10-CM | POA: Insufficient documentation

## 2024-09-14 HISTORY — PX: COLONOSCOPY: SHX5424

## 2024-09-14 LAB — GLUCOSE, CAPILLARY: Glucose-Capillary: 117 mg/dL — ABNORMAL HIGH (ref 70–99)

## 2024-09-14 MED ORDER — PROPOFOL 1000 MG/100ML IV EMUL
INTRAVENOUS | Status: AC
  Filled 2024-09-14: qty 100

## 2024-09-14 MED ORDER — SODIUM CHLORIDE 0.9 % IV SOLN: INTRAVENOUS | Status: DC

## 2024-09-14 NOTE — H&P (Signed)
 "  [name] , MD [address], [address], [place], [place], [address] Phone: [phone] Fax: [phone]  Primary Care Physician:  [name], DO   Pre-Procedure History & Physical: HPI:  Valerie Vazquez is a 69 y.o. female is here for an colonoscopy.   Past Medical History:  Diagnosis Date   Acute right-sided low back pain with right-sided sciatica 10/23/2017   Allergy    Anemia    Anginal pain    Bilateral ovarian cysts    Bilateral renal cysts    Bulimia nervosa (HCC) 05/07/2021   CAD (coronary artery disease) 09/28/2016   Cataract    They are still forming   Chest pain 10/14/2015   CHF (congestive heart failure) (HCC)    Diastolic   Chronic back pain    upper and lower (09/19/2014)   Chronic shoulder pain (Location of Primary Source of Pain) (Left) 01/29/2016   Clotting disorder 04/2001   Blood clots/ on Eliquis    Colitis 04/08/2016   Colon polyp 01/30/2017   COPD (chronic obstructive pulmonary disease) (HCC)    Mild case   Coronary vasospasm 12/09/2014   DDD (degenerative disc disease), cervical    a.) s/p ACDF C5-C7   Depression    DVT (deep venous thrombosis) (HCC)    Esophageal spasm    Essential hypertension 10/28/2013   Gallstones    Gastritis    Gastroenteritis    GERD (gastroesophageal reflux disease)    Heart disease 12/09/2016   High blood pressure    High cholesterol    History of blood transfusion 1985   related to hysterectomy   History of left heart catheterization (LHC) 12/09/2014   a.) LHC 12/09/2014: EF 50%; normal coronaries.   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 Rule out Paget's disease of the bone with blood work.    Hyperlipidemia 10/28/2013   Hypertension    Hypothyroidism    IBS (irritable bowel syndrome)    Long term  current use of anticoagulant    a.) apixaban    Lumbar central spinal stenosis (moderate at L4-5; mild at L2-3 and L3-4) 01/29/2016   Migraine    a few times/yr (09/19/2014)   Myocardial infarction (HCC) 10/2010 X 3   while hospitalized   Neck pain 01/29/2016   Osteoarthritis    Peripheral vascular disease    DVT/PE. On Eliquis    Pleural effusion, bilateral 07/31/2015   Pneumonia 04/2014   PONV (postoperative nausea and vomiting)    PTSD (post-traumatic stress disorder)    Hx. None currently (2024)   Pulmonary embolism (HCC) 04/2001; 09/19/2014   after gallbladder OR;    Schizoaffective disorder, depressive type (HCC) 08/28/2016   Sleep apnea    mild (09/19/2014)   Snoring    a. sleep study 5/16: No OSA   SOB (shortness of breath) 07/31/2015   Spinal stenosis    Spondylosis    Suicidal ideation 08/27/2016   a.) seen in ED with approx 6 months of reported SI. Reported ingestion of unknown pills to end life; last ingestion was 3 days PTA; UDS (+) for TCAs. SI related to depression, friend (loss of friend), and social determinants   Type II diabetes mellitus (HCC)    dx'd in 2007; lost weight; no RX for ~ 4  yr now (09/19/2014)    Past Surgical History:  Procedure Laterality Date   ABDOMINAL HYSTERECTOMY  10/1983   Partial hysterectomy uterus and tubes, ovaries removed in 2024?   ANTERIOR CERVICAL DECOMP/DISCECTOMY FUSION  10/2012   in [place]  C5-C7    APPENDECTOMY  1985   BACK SURGERY     BREAST CYST EXCISION Right    BREAST EXCISIONAL BIOPSY Right YRS AGO   NEG   CARDIAC CATHETERIZATION   2000's; 2009   CHOLECYSTECTOMY  2002   COLONOSCOPY WITH PROPOFOL  N/A 12/12/2016   Procedure: COLONOSCOPY WITH PROPOFOL ;  Surgeon: [name], MD;  Location: [facility] ENDOSCOPY;  Service: Endoscopy;  Laterality: N/A;   COLONOSCOPY WITH PROPOFOL  N/A 02/17/2017   Procedure: COLONOSCOPY WITH PROPOFOL ;  Surgeon: Kung Ruel, MD;  Location: [facility] ENDOSCOPY;  Service: Gastroenterology;   Laterality: N/A;   COLONOSCOPY WITH PROPOFOL  N/A 11/04/2021   Procedure: COLONOSCOPY WITH PROPOFOL ;  Surgeon: Kung Ruel, MD;  Location: [facility] ENDOSCOPY;  Service: Gastroenterology;  Laterality: N/A;   CYSTOSCOPY N/A 05/15/2022   Procedure: CYSTOSCOPY;  Surgeon: Leonce Garnette BIRCH, MD;  Location: [facility] ORS;  Service: Gynecology;  Laterality: N/A;   ESOPHAGOGASTRODUODENOSCOPY N/A 03/02/2024   Procedure: EGD (ESOPHAGOGASTRODUODENOSCOPY);  Surgeon: Kung Ruel, MD;  Location: [facility] ENDOSCOPY;  Service: Gastroenterology;  Laterality: N/A;  DM also on OZEMPIC    ESOPHAGOGASTRODUODENOSCOPY N/A 06/08/2024   Procedure: EGD (ESOPHAGOGASTRODUODENOSCOPY);  Surgeon: Kung Ruel, MD;  Location: [facility] ENDOSCOPY;  Service: Gastroenterology;  Laterality: N/A;  DM-Eliquis    ESOPHAGOGASTRODUODENOSCOPY (EGD) WITH PROPOFOL  N/A 02/17/2017   Procedure: ESOPHAGOGASTRODUODENOSCOPY (EGD) WITH PROPOFOL ;  Surgeon: Kung Ruel, MD;  Location: [facility] ENDOSCOPY;  Service: Gastroenterology;  Laterality: N/A;   ESOPHAGOGASTRODUODENOSCOPY (EGD) WITH PROPOFOL  N/A 03/07/2019   Procedure: ESOPHAGOGASTRODUODENOSCOPY (EGD) WITH PROPOFOL ;  Surgeon: Kung Ruel, MD;  Location: [facility] ENDOSCOPY;  Service: Gastroenterology;  Laterality: N/A;   EXCISION/RELEASE BURSA HIP Right 12/1984   HERNIA REPAIR  1985   IVC FILTER REMOVAL     2020s   JOINT REPLACEMENT     Left hip also a right hip bursectomy with complications   LEFT HEART CATHETERIZATION WITH CORONARY ANGIOGRAM N/A 12/09/2014   Procedure: LEFT HEART CATHETERIZATION WITH CORONARY ANGIOGRAM;  Surgeon: Lonni BIRCH Cash, MD;  Location: [facility] CATH LAB;  Service: Cardiovascular;  Laterality: N/A;   PARTIAL HYSTERECTOMY  1985   ROBOTIC ASSISTED BILATERAL SALPINGO OOPHERECTOMY Bilateral 05/15/2022   Procedure: XI ROBOTIC ASSISTED BILATERAL OOPHORECTOMY;  Surgeon: Leonce Garnette BIRCH, MD;  Location: [facility] ORS;  Service: Gynecology;  Laterality: Bilateral;   SPINE SURGERY  07/2023   Also 10/2012  in [place] the neck, and i cant remember the other one 2022 or 05/2022?   TONSILLECTOMY AND ADENOIDECTOMY  ~ 1968   TOTAL HIP ARTHROPLASTY Left 04/06/2014   UMBILICAL HERNIA REPAIR  1985   VENA CAVA FILTER PLACEMENT  03/2014    Prior to Admission medications  Medication Sig Start Date End Date Taking? Authorizing Provider  albuterol  (VENTOLIN  HFA) 108 (90 Base) MCG/ACT inhaler TAKE 2 PUFFS BY MOUTH EVERY 6 HOURS AS NEEDED FOR WHEEZE OR SHORTNESS OF BREATH 12/17/23   Marylynn Verneita CROME, MD  b complex vitamins capsule Take 1 capsule by mouth daily.    [provider]  baclofen  (LIORESAL ) 10 MG tablet Take 1 tablet (10 mg total) by mouth 3 (three) times daily as needed for muscle spasms. 06/16/24   [name], DO  blood glucose meter kit and supplies KIT Dispense based on patient and insurance preference. Use once daily fasting. Dx code  E11.9. 08/27/21   Maribeth Camellia MATSU, MD  Blood Glucose Monitoring Suppl (GHT BLOOD GLUCOSE MONITOR) w/Device KIT DISPENSE BASED ON PATIENT AND INSURANCE PREFERENCE. USE ONCE DAILY AS DIRECTED. DX CODE E11.9. 01/14/21   Maribeth Camellia MATSU, MD  budesonide -formoterol  (SYMBICORT ) 160-4.5 MCG/ACT inhaler     [provider]  dicyclomine  (BENTYL ) 20 MG tablet TAKE ONE TABLET BY MOUTH THREE TIMES DAILY AS NEEDED FOR ABDOMINAL SPASMS (VIAL) 06/13/24   Pardue, Lauraine SAILOR, DO  ELIQUIS  5 MG TABS tablet TAKE 1 TABLET BY MOUTH TWICE A DAY 06/11/24   Pardue, Sarah N, DO  empagliflozin  (JARDIANCE ) 10 MG TABS tablet Take 1 tablet (10 mg total) by mouth daily before breakfast. 08/17/24   Pardue, Lauraine SAILOR, DO  escitalopram  (LEXAPRO ) 20 MG tablet TAKE ONE TABLET BY MOUTH DAILY AT 9AM 10/24/22   Maribeth Camellia MATSU, MD  estradiol  (ESTRACE ) 0.1 MG/GM vaginal cream Place 0.5 g vaginally 2 (two) times a week. Place 0.5g nightly for two weeks then twice a week after 01/25/24   Guadlupe Dull T, MD  famotidine  (PEPCID ) 20 MG tablet TAKE 1 TABLET BY MOUTH EVERYDAY AT BEDTIME 09/08/24   Pardue,  Lauraine SAILOR, DO  ferrous sulfate  325 (65 FE) MG tablet Take 325 mg by mouth daily with breakfast.    [provider]  fluticasone  (FLONASE ) 50 MCG/ACT nasal spray SPRAY 2 SPRAYS INTO EACH NOSTRIL EVERY DAY 08/11/24   Pardue, Lauraine SAILOR, DO  furosemide  (LASIX ) 20 MG tablet TAKE ONE TABLET BY MOUTH DAILY AS NEEDED FOR EDEMA (VIAL) 01/12/23   Maribeth Camellia MATSU, MD  gabapentin  (NEURONTIN ) 600 MG tablet TAKE 1 TABLET BY MOUTH DAILY AT 9AM, AND 1 AND 1/2 TABS AT 9PM 06/13/24   Pardue, Lauraine SAILOR, DO  levothyroxine  (SYNTHROID ) 25 MCG tablet TAKE ONE TABLET BY MOUTH DAILY AT 7AM BEFORE BREAKFAST AND 2-3 HOURS BEFORE TAKING ANY OTHER MEDICATION OR SUPPLEMENT 08/17/24   Pardue, Lauraine SAILOR, DO  Magnesium  Citrate (MAGNESIUM  GUMMIES PO) Take 200 mg by mouth daily.    [provider]  Multiple Vitamins-Minerals (MULTIVITAMIN GUMMIES ADULT) CHEW Chew 2 each by mouth daily.    [provider]  nitroGLYCERIN  (NITROSTAT ) 0.4 MG SL tablet Place 1 tablet (0.4 mg total) under the tongue every 5 (five) minutes as needed for chest pain. 06/26/23   Darliss Rogue, MD  OLANZapine  (ZYPREXA ) 10 MG tablet TAKE 1 TABLET BY MOUTH EVERYDAY AT BEDTIME 11/17/22   Maribeth Camellia MATSU, MD  ondansetron  (ZOFRAN -ODT) 4 MG disintegrating tablet Take 1 tablet (4 mg total) by mouth every 6 (six) hours as needed. for nausea 08/17/24   [name], DO  pantoprazole  (PROTONIX ) 40 MG tablet TAKE 1 TABLET BY MOUTH EVERY DAY 06/13/24   Pardue, Lauraine SAILOR, DO  potassium chloride  SA (KLOR-CON  M) 20 MEQ tablet TAKE ONE TABLET BY MOUTH DAILY AS NEEDED TAKE WITH LASIX  (VIAL) Patient taking differently: 20 mEq as needed. TAKE ONE TABLET BY MOUTH DAILY AS NEEDED TAKE WITH LASIX  (VIAL) 12/29/22   Maribeth Camellia MATSU, MD  Probiotic Product (PROBIOTIC BLEND) CAPS Take 1 capsule by mouth daily.    [provider]  rosuvastatin  (CRESTOR ) 40 MG tablet Take 1 tablet (40 mg total) by mouth at bedtime. 06/07/24   [name], DO   Semaglutide ,0.25 or 0.5MG /DOS, (OZEMPIC , 0.25 OR 0.5 MG/DOSE,) 2 MG/3ML SOPN Inject 0.5 mg into the skin once a week. 08/17/24   [name], DO  senna (SENOKOT) 8.6 MG tablet Take 1 tablet by mouth  daily. 02/23/24   [provider]  sucralfate  (CARAFATE ) 1 g tablet Take 1 tablet (1 g total) by mouth 4 (four) times daily -  with meals and at bedtime. 08/11/24   Franchot Isaiah LABOR, MD  traMADol  (ULTRAM ) 50 MG tablet Take 1 tablet (50 mg total) by mouth every 12 (twelve) hours as needed. 08/08/24   Simmons-Robinson, Rockie, MD  Vibegron  (GEMTESA ) 75 MG TABS Take 1 tablet (75 mg total) by mouth daily. 09/06/24   Guadlupe Lianne DASEN, MD  vitamin C (ASCORBIC ACID) 250 MG tablet Take 250 mg by mouth daily.    [provider]  Zinc  Citrate (ZINC  GUMMY PO) Take 2 each by mouth daily.    [provider]    Allergies as of 08/03/2024 - Review Complete 07/11/2024  Allergen Reaction Noted   Other Hives, Itching, and Swelling 12/09/2014   Abilify [aripiprazole] Other (See Comments) 09/20/2013   Augmentin [amoxicillin-pot clavulanate] Diarrhea and Nausea And Vomiting 09/20/2013   Bactrim [sulfamethoxazole-trimethoprim] Diarrhea 09/20/2013   Ceftin [cefuroxime axetil] Diarrhea 09/20/2013   Clavulanic acid  09/11/2022   Coconut (cocos nucifera)  10/19/2015   Diclofenac  Other (See Comments) 08/01/2014   Indocin [indomethacin] Other (See Comments) 09/20/2013   Linaclotide Diarrhea 12/09/2014   Lisinopril Diarrhea and Other (See Comments) 09/20/2013   Morphine  Other (See Comments) 12/09/2014   Morphine  and codeine  Other (See Comments) 09/20/2013   Simvastatin Other (See Comments) 08/01/2014   Sulfa antibiotics Diarrhea 12/09/2014   Wellbutrin [bupropion] Other (See Comments) 09/20/2013   Zetia  [ezetimibe ]  09/18/2017   Seroquel [quetiapine fumarate] Palpitations 08/16/2014    Family History  Problem Relation Age of Onset   Stroke Mother        Deceased   Lung cancer Father         Deceased   Alcohol abuse Father    Breast cancer Sister 63   Healthy Daughter    Cancer Brother    Healthy Son        x 2   Other Son        Suicide   Birth defects Son    Early death Son    Cancer Brother    Intellectual disability Son    Heart attack Neg Hx     Social History   Socioeconomic History   Marital status: Divorced    Spouse name: Not on file   Number of children: Not on file   Years of education: Not on file   Highest education level: Some college, no degree  Occupational History   Not on file  Tobacco Use   Smoking status: Former    Current packs/day: 0.00    Average packs/day: 1.5 packs/day for 10.0 years (15.0 ttl pk-yrs)    Types: Cigarettes    Start date: 04/11/1969    Quit date: 04/12/1979    Years since quitting: 45.4   Smokeless tobacco: Never  Vaping Use   Vaping status: Never Used  Substance and Sexual Activity   Alcohol use: No    Alcohol/week: 0.0 standard drinks of alcohol   Drug use: No   Sexual activity: Not Currently  Other Topics Concern   Not on file  Social History Narrative   Lives with fiancee in an apartment on the first floor.  Has 3 grown children.  One child passed away.  On disability 1994-10-19 - 2000, 2007 - current.     Education: some college.   Caffeine rarely   retired   [employer]  Tobacco Use: Medium Risk (09/13/2024)   Received from [facility]   Patient History    Smoking Tobacco Use: Former    Smokeless Tobacco Use: Never    Passive Exposure: Not on file  Financial Resource Strain: Medium Risk (09/13/2024)   [facility] from [facility]   Overall Financial Resource Strain (CARDIA)    Difficulty of Paying Living Expenses: Somewhat hard  Food Insecurity: No Food Insecurity (09/13/2024)   Received from [facility]   Epic    Within the past 12 months, you worried that your food would run out before you got the money to buy more.: Never true    Within  the past 12 months, the food you bought just didn't last and you didn't have money to get more.: Never true  Transportation Needs: No Transportation Needs (09/13/2024)   Received from [facility] - Transportation    In the past 12 months, has lack of transportation kept you from medical appointments or from getting medications?: No    Lack of Transportation (Non-Medical): No  Physical Activity: Inactive (08/16/2024)   Exercise Vital Sign    Days of Exercise per Week: 0 days    Minutes of Exercise per Session: Not on file  Stress: Stress Concern Present (08/16/2024)   [employer] - Occupational Stress Questionnaire    Feeling of Stress: To some extent  Social Connections: Moderately Integrated (08/16/2024)   Social Connection and Isolation Panel    Frequency of Communication with Friends and Family: More than three times a week    Frequency of Social Gatherings with Friends and Family: Three times a week    Attends Religious Services: More than 4 times per year    Active Member of Clubs or Organizations: Yes    Attends Banker Meetings: More than 4 times per year    Marital Status: Divorced  Intimate Partner Violence: Not At Risk (02/10/2024)   Humiliation, Afraid, Rape, and Kick questionnaire    Fear of Current or Ex-Partner: No    Emotionally Abused: No    Physically Abused: No    Sexually Abused: No  Depression (PHQ2-9): Low Risk (08/17/2024)   Depression (PHQ2-9)    PHQ-2 Score: 4  Alcohol Screen: Low Risk (02/10/2024)   Alcohol Screen    Last Alcohol Screening Score (AUDIT): 0  Housing: Low Risk  (09/13/2024)   Received from [facility]   Epic    In the last 12 months, was there a time when you were not able to pay the mortgage or rent on time?: No    In the past 12 months, how many times have you moved where you were living?: 0    At any time in the past 12 months, were you homeless or  living in a shelter (including now)?: No  Utilities: Not At Risk (09/13/2024)   Received from [facility]   Epic    In the past 12 months has the electric, gas, oil, or water company threatened to shut off services in your home?: No  Health Literacy: Adequate Health Literacy (03/28/2024)   B1300 Health Literacy    Frequency of need for help with medical instructions: Never    Review of Systems: See HPI, otherwise negative ROS  Physical Exam: BP 131/76   Pulse 92   Temp (!) 96.2 F (35.7 C) (Temporal)   Resp 18   Ht 5'  6 (1.676 m)   Wt 80.7 kg   SpO2 92%   BMI 28.73 kg/m  General:   Alert,  pleasant and cooperative in NAD Head:  Normocephalic and atraumatic. Neck:  Supple; no masses or thyromegaly. Lungs:  Clear throughout to auscultation, normal respiratory effort.    Heart:  +S1, +S2, Regular rate and rhythm, No edema. Abdomen:  Soft, nontender and nondistended. Normal bowel sounds, without guarding, and without rebound.   Neurologic:  Alert and  oriented x4;  grossly normal neurologically.  Impression/Plan: Valerie Vazquez is here for an colonoscopy to be performed for surveillance due to prior history of colon polyps   Risks, benefits, limitations, and alternatives regarding  colonoscopy have been reviewed with the patient.  Questions have been answered.  All parties agreeable.   [name], MD  09/14/2024, 8:10 AM  "

## 2024-09-14 NOTE — OR Nursing (Signed)
 Patient not completely clean. Dr Therisa rescheduled tomorrow

## 2024-09-14 NOTE — Anesthesia Preprocedure Evaluation (Signed)
 "                                  Anesthesia Evaluation  Patient identified by MRN, date of birth, ID band Patient awake    Reviewed: Allergy & Precautions, NPO status , Patient's Chart, lab work & pertinent test results  History of Anesthesia Complications (+) PONV and history of anesthetic complications  Airway Mallampati: II  TM Distance: >3 FB Neck ROM: Full    Dental  (+) Partial Upper   Pulmonary neg pulmonary ROS, sleep apnea , COPD, former smoker   Pulmonary exam normal  + decreased breath sounds      Cardiovascular Exercise Tolerance: Good hypertension, Pt. on medications + CAD, + Past MI, + Peripheral Vascular Disease and +CHF  negative cardio ROS Normal cardiovascular exam Rate:Normal     Neuro/Psych  Headaches  Anxiety   Schizophrenia  negative neurological ROS  negative psych ROS   GI/Hepatic negative GI ROS, Neg liver ROS,GERD  Medicated,,  Endo/Other  negative endocrine ROSdiabetes, Type 2, Oral Hypoglycemic AgentsHypothyroidism    Renal/GU negative Renal ROS  negative genitourinary   Musculoskeletal  (+) Arthritis ,    Abdominal Normal abdominal exam  (+)   Peds negative pediatric ROS (+)  Hematology negative hematology ROS (+) Blood dyscrasia, anemia   Anesthesia Other Findings Past Medical History: 10/23/2017: Acute right-sided low back pain with right-sided sciatica No date: Allergy No date: Anemia No date: Anginal pain No date: Bilateral ovarian cysts No date: Bilateral renal cysts 05/07/2021: Bulimia nervosa (HCC) 09/28/2016: CAD (coronary artery disease) No date: Cataract     Comment:  They are still forming 10/14/2015: Chest pain No date: CHF (congestive heart failure) (HCC)     Comment:  Diastolic No date: Chronic back pain     Comment:  upper and lower (09/19/2014) 01/29/2016: Chronic shoulder pain (Location of Primary Source of  Pain) (Left) 04/2001: Clotting disorder     Comment:  Blood clots/ on  Eliquis  04/08/2016: Colitis 01/30/2017: Colon polyp No date: COPD (chronic obstructive pulmonary disease) (HCC)     Comment:  Mild case 12/09/2014: Coronary vasospasm No date: DDD (degenerative disc disease), cervical     Comment:  a.) s/p ACDF C5-C7 No date: Depression No date: DVT (deep venous thrombosis) (HCC) No date: Esophageal spasm 10/28/2013: Essential hypertension No date: Gallstones No date: Gastritis No date: Gastroenteritis No date: GERD (gastroesophageal reflux disease) 12/09/2016: Heart disease No date: High blood pressure No date: High cholesterol 1985: History of blood transfusion     Comment:  related to hysterectomy 12/09/2014: History of left heart catheterization (LHC)     Comment:  a.) LHC 12/09/2014: EF 50%; normal coronaries. No date: History of nuclear stress test     Comment:  Myoview  1/19:  EF 65, no ischemia or scar 09/19/2014: History of pulmonary embolus (PE) 01/29/2016: Hx of cervical spine surgery 02/13/2015: Hypercementosis     Comment:  Per patient diagnosed in [place] . Rule out Paget's               disease of the bone with blood work.  10/28/2013: Hyperlipidemia No date: Hypertension No date: Hypothyroidism No date: IBS (irritable bowel syndrome) No date: Long term current use of anticoagulant     Comment:  a.) apixaban  01/29/2016: Lumbar central spinal stenosis (moderate at L4-5; mild at  L2-3 and L3-4) No date: Migraine     Comment:  a few times/yr (  09/19/2014) 10/2010 X 3: Myocardial infarction [facility])     Comment:  while hospitalized 01/29/2016: Neck pain No date: Osteoarthritis No date: Peripheral vascular disease     Comment:  DVT/PE. On Eliquis  07/31/2015: Pleural effusion, bilateral 04/2014: Pneumonia No date: PONV (postoperative nausea and vomiting) No date: PTSD (post-traumatic stress disorder)     Comment:  Hx. None currently (2024) 04/2001; 09/19/2014: Pulmonary embolism (HCC)     Comment:  after gallbladder OR;   08/28/2016: Schizoaffective disorder, depressive type (HCC) No date: Sleep apnea     Comment:  mild (09/19/2014) No date: Snoring     Comment:  a. sleep study 5/16: No OSA 07/31/2015: SOB (shortness of breath) No date: Spinal stenosis No date: Spondylosis 08/27/2016: Suicidal ideation     Comment:  a.) seen in ED with approx 6 months of reported SI.               Reported ingestion of unknown pills to end life; last               ingestion was 3 days PTA; UDS (+) for TCAs. SI related to              depression, friend (loss of friend), and social               determinants No date: Type II diabetes mellitus (HCC)     Comment:  dx'd in 2007; lost weight; no RX for ~ 4 yr now               (09/19/2014)  Past Surgical History: 10/1983: ABDOMINAL HYSTERECTOMY     Comment:  Partial hysterectomy uterus and tubes, ovaries removed               in 2024? 10/2012: ANTERIOR CERVICAL DECOMP/DISCECTOMY FUSION     Comment:  in [place]  C5-C7  1985: APPENDECTOMY No date: BACK SURGERY No date: BREAST CYST EXCISION; Right YRS AGO: BREAST EXCISIONAL BIOPSY; Right     Comment:  NEG  2000's; 2009: CARDIAC CATHETERIZATION 2002: CHOLECYSTECTOMY 12/12/2016: COLONOSCOPY WITH PROPOFOL ; N/A     Comment:  Procedure: COLONOSCOPY WITH PROPOFOL ;  Surgeon: [name], MD;  Location: [facility] ENDOSCOPY;  Service: Endoscopy;              Laterality: N/A; 02/17/2017: COLONOSCOPY WITH PROPOFOL ; N/A     Comment:  Procedure: COLONOSCOPY WITH PROPOFOL ;  Surgeon: Kung Ruel, MD;  Location: [facility] ENDOSCOPY;  Service:               Gastroenterology;  Laterality: N/A; 11/04/2021: COLONOSCOPY WITH PROPOFOL ; N/A     Comment:  Procedure: COLONOSCOPY WITH PROPOFOL ;  Surgeon: Kung Ruel, MD;  Location: [facility] ENDOSCOPY;  Service:               Gastroenterology;  Laterality: N/A; 05/15/2022: CYSTOSCOPY; N/A     Comment:  Procedure: CYSTOSCOPY;  Surgeon: Leonce Garnette BIRCH,  MD;              Location: [facility] ORS;  Service: Gynecology;  Laterality:               N/A; 03/02/2024: ESOPHAGOGASTRODUODENOSCOPY; N/A     Comment:  Procedure: EGD (ESOPHAGOGASTRODUODENOSCOPY);  Surgeon:  Therisa Bi, MD;  Location: [facility] ENDOSCOPY;  Service:               Gastroenterology;  Laterality: N/A;  DM also on OZEMPIC  06/08/2024: ESOPHAGOGASTRODUODENOSCOPY; N/A     Comment:  Procedure: EGD (ESOPHAGOGASTRODUODENOSCOPY);  Surgeon:               Therisa Bi, MD;  Location: [facility] ENDOSCOPY;  Service:               Gastroenterology;  Laterality: N/A;  DM-Eliquis  02/17/2017: ESOPHAGOGASTRODUODENOSCOPY (EGD) WITH PROPOFOL ; N/A     Comment:  Procedure: ESOPHAGOGASTRODUODENOSCOPY (EGD) WITH               PROPOFOL ;  Surgeon: Therisa Bi, MD;  Location: [facility]               ENDOSCOPY;  Service: Gastroenterology;  Laterality: N/A; 03/07/2019: ESOPHAGOGASTRODUODENOSCOPY (EGD) WITH PROPOFOL ; N/A     Comment:  Procedure: ESOPHAGOGASTRODUODENOSCOPY (EGD) WITH               PROPOFOL ;  Surgeon: Therisa Bi, MD;  Location: [facility]               ENDOSCOPY;  Service: Gastroenterology;  Laterality: N/A; 12/1984: EXCISION/RELEASE BURSA HIP; Right 1985: HERNIA REPAIR No date: IVC FILTER REMOVAL     Comment:  2020s No date: JOINT REPLACEMENT     Comment:  Left hip also a right hip bursectomy with complications 12/09/2014: LEFT HEART CATHETERIZATION WITH CORONARY ANGIOGRAM; N/A     Comment:  Procedure: LEFT HEART CATHETERIZATION WITH CORONARY               ANGIOGRAM;  Surgeon: Lonni JONETTA Cash, MD;                Location: [facility] CATH LAB;  Service: Cardiovascular;                Laterality: N/A; 1985: PARTIAL HYSTERECTOMY 05/15/2022: ROBOTIC ASSISTED BILATERAL SALPINGO OOPHERECTOMY;  Bilateral     Comment:  Procedure: XI ROBOTIC ASSISTED BILATERAL OOPHORECTOMY;                Surgeon: Leonce Garnette JONETTA, MD;  Location: [facility] ORS;                Service: Gynecology;  Laterality:  Bilateral; 07/2023: SPINE SURGERY     Comment:  Also 10/2012 in [place] the neck, and i cant remember the               other one 2022 or 05/2022? ~ 1968: TONSILLECTOMY AND ADENOIDECTOMY 04/06/2014: TOTAL HIP ARTHROPLASTY; Left 1985: UMBILICAL HERNIA REPAIR 03/2014: VENA CAVA FILTER PLACEMENT  BMI    Body Mass Index: 28.73 kg/m      Reproductive/Obstetrics negative OB ROS                              Anesthesia Physical Anesthesia Plan  ASA: 3  Anesthesia Plan: General   Post-op Pain Management:    Induction: Intravenous  PONV Risk Score and Plan: Propofol  infusion and TIVA  Airway Management Planned: Natural Airway and Nasal Cannula  Additional Equipment:   Intra-op Plan:   Post-operative Plan:   Informed Consent: I have reviewed the patients History and Physical, chart, labs and discussed the procedure including the risks, benefits and alternatives for the proposed anesthesia with the patient or authorized representative who has indicated his/her understanding and acceptance.     Dental Advisory  Given  Plan Discussed with: CRNA  Anesthesia Plan Comments:         Anesthesia Quick Evaluation  "

## 2024-09-15 ENCOUNTER — Ambulatory Visit

## 2024-09-15 ENCOUNTER — Encounter: Payer: Self-pay | Admitting: Gastroenterology

## 2024-09-15 ENCOUNTER — Ambulatory Visit
Admission: RE | Admit: 2024-09-15 | Discharge: 2024-09-15 | Disposition: A | Discharge status: Home / Self Care | Attending: Gastroenterology | Admitting: Gastroenterology

## 2024-09-15 ENCOUNTER — Encounter
Admission: RE | Disposition: A | Discharge status: Home / Self Care | Payer: Self-pay | Source: Home / Self Care | Attending: Gastroenterology

## 2024-09-15 DIAGNOSIS — K5730 Diverticulosis of large intestine without perforation or abscess without bleeding: Secondary | ICD-10-CM | POA: Insufficient documentation

## 2024-09-15 DIAGNOSIS — K621 Rectal polyp: Secondary | ICD-10-CM | POA: Diagnosis not present

## 2024-09-15 DIAGNOSIS — I110 Hypertensive heart disease with heart failure: Secondary | ICD-10-CM | POA: Insufficient documentation

## 2024-09-15 DIAGNOSIS — Z87891 Personal history of nicotine dependence: Secondary | ICD-10-CM | POA: Diagnosis not present

## 2024-09-15 DIAGNOSIS — Z1211 Encounter for screening for malignant neoplasm of colon: Secondary | ICD-10-CM | POA: Insufficient documentation

## 2024-09-15 DIAGNOSIS — I5032 Chronic diastolic (congestive) heart failure: Secondary | ICD-10-CM | POA: Insufficient documentation

## 2024-09-15 DIAGNOSIS — E119 Type 2 diabetes mellitus without complications: Secondary | ICD-10-CM | POA: Insufficient documentation

## 2024-09-15 HISTORY — PX: COLONOSCOPY: SHX5424

## 2024-09-15 HISTORY — PX: POLYPECTOMY: SHX149

## 2024-09-15 LAB — GLUCOSE, CAPILLARY: Glucose-Capillary: 103 mg/dL — ABNORMAL HIGH (ref 70–99)

## 2024-09-15 MED ORDER — SODIUM CHLORIDE 0.9 % IV SOLN
INTRAVENOUS | Status: DC
  Administered 2024-09-15: 500 mL via INTRAVENOUS

## 2024-09-15 MED ORDER — LIDOCAINE HCL (PF) 2 % IJ SOLN
INTRAMUSCULAR | Status: AC
  Filled 2024-09-15: qty 5

## 2024-09-15 MED ORDER — PROPOFOL 500 MG/50ML IV EMUL
INTRAVENOUS | Status: DC | PRN
  Administered 2024-09-15: 150 ug/kg/min via INTRAVENOUS
  Administered 2024-09-15: 100 mg via INTRAVENOUS

## 2024-09-15 MED ORDER — LIDOCAINE HCL (CARDIAC) PF 100 MG/5ML IV SOSY
PREFILLED_SYRINGE | INTRAVENOUS | Status: DC | PRN
  Administered 2024-09-15: 100 mg via INTRAVENOUS

## 2024-09-15 NOTE — Anesthesia Preprocedure Evaluation (Signed)
"                                    Anesthesia Evaluation  Patient identified by MRN, date of birth, ID band Patient awake    Reviewed: Allergy & Precautions, H&P , NPO status , Patient's Chart, lab work & pertinent test results  Airway Mallampati: II  TM Distance: >3 FB Neck ROM: Full    Dental no notable dental hx. (+) Poor Dentition, Partial Lower   Pulmonary neg pulmonary ROS, former smoker   Pulmonary exam normal breath sounds clear to auscultation       Cardiovascular hypertension, negative cardio ROS Normal cardiovascular exam Rhythm:Regular Rate:Normal     Neuro/Psych negative neurological ROS  negative psych ROS   GI/Hepatic negative GI ROS, Neg liver ROS,,,  Endo/Other  negative endocrine ROSdiabetes    Renal/GU negative Renal ROS  negative genitourinary   Musculoskeletal negative musculoskeletal ROS (+)    Abdominal   Peds negative pediatric ROS (+)  Hematology negative hematology ROS (+)   Anesthesia Other Findings   Reproductive/Obstetrics negative OB ROS                              Anesthesia Physical Anesthesia Plan  ASA: 3  Anesthesia Plan: General   Post-op Pain Management:    Induction: Intravenous  PONV Risk Score and Plan:   Airway Management Planned:   Additional Equipment:   Intra-op Plan:   Post-operative Plan: Extubation in OR  Informed Consent: I have reviewed the patients History and Physical, chart, labs and discussed the procedure including the risks, benefits and alternatives for the proposed anesthesia with the patient or authorized representative who has indicated his/her understanding and acceptance.     Dental advisory given  Plan Discussed with: CRNA  Anesthesia Plan Comments:         Anesthesia Quick Evaluation  "

## 2024-09-15 NOTE — Op Note (Signed)
 [facility] Gastroenterology Patient Name: Valerie Vazquez Procedure Date: 09/15/2024 10:53 AM MRN: [id number] Account #: [id number] Date of Birth: [birth year] Admit Type: Outpatient Age: 69 Room: [facility] ENDO ROOM 2 Gender: Female Note Status: Finalized Instrument Name: Peds Colonoscope [id number] Procedure:             Colonoscopy Indications:           Surveillance: Personal history of adenomatous polyps                         on last colonoscopy 5 years ago, Last colonoscopy:                         March 2023 Providers:             [name] MD, MD Referring MD:          [name] MD, MD (Referring MD), Lauraine GEANNIE Buoy                         (Referring MD) Medicines:             Monitored Anesthesia Care Complications:         No immediate complications. Procedure:             Pre-Anesthesia Assessment:                        - Prior to the procedure, a History and Physical was                         performed, and patient medications, allergies and                         sensitivities were reviewed. The patient's tolerance                         of previous anesthesia was reviewed.                        - The risks and benefits of the procedure and the                         sedation options and risks were discussed with the                         patient. All questions were answered and informed                         consent was obtained.                        - ASA Grade Assessment: II - A patient with mild                         systemic disease.                        After obtaining informed consent, the colonoscope was                         passed under direct vision.  Throughout the procedure,                         the patient's blood pressure, pulse, and oxygen                          saturations were monitored continuously. The                         Colonoscope was introduced through the anus and                         advanced to the  the cecum, identified by the                         appendiceal orifice. The colonoscopy was performed                         with ease. The patient tolerated the procedure well.                         The quality of the bowel preparation was excellent.                         The ileocecal valve, appendiceal orifice, and rectum                         were photographed. Findings:      The perianal and digital rectal examinations were normal.      Multiple medium-mouthed diverticula were found in the sigmoid colon.      A 5 mm polyp was found in the rectum. The polyp was sessile. The polyp       was removed with a cold snare. Resection and retrieval were complete.      The exam was otherwise without abnormality on direct and retroflexion       views. Impression:            - Diverticulosis in the sigmoid colon.                        - One 5 mm polyp in the rectum, removed with a cold                         snare. Resected and retrieved.                        - The examination was otherwise normal on direct and                         retroflexion views. Recommendation:        - Discharge patient to home (with escort).                        - Resume previous diet.                        - Continue present medications.                        - Await pathology results.                        -  Repeat colonoscopy in 5 years for surveillance and                         for surveillance based on pathology results. Procedure Code(s):     --- Professional ---                        [phone], Colonoscopy, flexible; with removal of                         tumor(s), polyp(s), or other lesion(s) by snare                         technique Diagnosis Code(s):     --- Professional ---                        Z86.010, Personal history of colonic polyps                        D12.8, Benign neoplasm of rectum                        K57.30, Diverticulosis of large intestine without                          perforation or abscess without bleeding CPT copyright 2022 [employer]. All rights reserved. The codes documented in this report are preliminary and upon coder review may  be revised to meet current compliance requirements. [name], MD [name] MD, MD 09/15/2024 11:20:07 AM This report has been signed electronically. Number of Addenda: 0 Note Initiated On: 09/15/2024 10:53 AM Scope Withdrawal Time: 0 hours 10 minutes 57 seconds  Total Procedure Duration: 0 hours 15 minutes 36 seconds  Estimated Blood Loss:  Estimated blood loss: none.      [facility]

## 2024-09-15 NOTE — H&P (Signed)
 "  [name] , MD [address], [place], [place], [address] Phone: [phone] Fax: [phone]  Primary Care Physician:  [name], DO   Pre-Procedure History & Physical: HPI:  Valerie Vazquez is a 69 y.o. female is here for an colonoscopy.   Past Medical History:  Diagnosis Date   Acute right-sided low back pain with right-sided sciatica 10/23/2017   Allergy    Anemia    Anginal pain    Bilateral ovarian cysts    Bilateral renal cysts    Bulimia nervosa (HCC) 05/07/2021   CAD (coronary artery disease) 09/28/2016   Cataract    They are still forming   Chest pain 10/14/2015   CHF (congestive heart failure) (HCC)    Diastolic   Chronic back pain    upper and lower (09/19/2014)   Chronic shoulder pain (Location of Primary Source of Pain) (Left) 01/29/2016   Clotting disorder 04/2001   Blood clots/ on Eliquis    Colitis 04/08/2016   Colon polyp 01/30/2017   COPD (chronic obstructive pulmonary disease) (HCC)    Mild case   Coronary vasospasm 12/09/2014   DDD (degenerative disc disease), cervical    a.) s/p ACDF C5-C7   Depression    DVT (deep venous thrombosis) (HCC)    Esophageal spasm    Essential hypertension 10/28/2013   Gallstones    Gastritis    Gastroenteritis    GERD (gastroesophageal reflux disease)    Heart disease 12/09/2016   High blood pressure    High cholesterol    History of blood transfusion 1985   related to hysterectomy   History of left heart catheterization (LHC) 12/09/2014   a.) LHC 12/09/2014: EF 50%; normal coronaries.   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 Rule out Paget's disease of the bone with blood work.    Hyperlipidemia 10/28/2013   Hypertension    Hypothyroidism    IBS (irritable bowel syndrome)    Long term  current use of anticoagulant    a.) apixaban    Lumbar central spinal stenosis (moderate at L4-5; mild at L2-3 and L3-4) 01/29/2016   Migraine    a few times/yr (09/19/2014)   Myocardial infarction (HCC) 10/2010 X 3   while hospitalized   Neck pain 01/29/2016   Osteoarthritis    Peripheral vascular disease    DVT/PE. On Eliquis    Pleural effusion, bilateral 07/31/2015   Pneumonia 04/2014   PONV (postoperative nausea and vomiting)    PTSD (post-traumatic stress disorder)    Hx. None currently (2024)   Pulmonary embolism (HCC) 04/2001; 09/19/2014   after gallbladder OR;    Schizoaffective disorder, depressive type (HCC) 08/28/2016   Sleep apnea    mild (09/19/2014)   Snoring    a. sleep study 5/16: No OSA   SOB (shortness of breath) 07/31/2015   Spinal stenosis    Spondylosis    Suicidal ideation 08/27/2016   a.) seen in ED with approx 6 months of reported SI. Reported ingestion of unknown pills to end life; last ingestion was 3 days PTA; UDS (+) for TCAs. SI related to depression, friend (loss of friend), and social determinants   Type II diabetes mellitus (HCC)    dx'd in 2007; lost weight; no RX for ~ 4  yr now (09/19/2014)    Past Surgical History:  Procedure Laterality Date   ABDOMINAL HYSTERECTOMY  10/1983   Partial hysterectomy uterus and tubes, ovaries removed in 2024?   ANTERIOR CERVICAL DECOMP/DISCECTOMY FUSION  10/2012   in [place]  C5-C7    APPENDECTOMY  1985   BACK SURGERY     BREAST CYST EXCISION Right    BREAST EXCISIONAL BIOPSY Right YRS AGO   NEG   CARDIAC CATHETERIZATION   2000's; 2009   CHOLECYSTECTOMY  2002   COLONOSCOPY N/A 09/14/2024   Procedure: COLONOSCOPY;  Surgeon: Therisa Bi, MD;  Location: [facility] ENDOSCOPY;  Service: Gastroenterology;  Laterality: N/A;  DM, OZEMPIC    COLONOSCOPY WITH PROPOFOL  N/A 12/12/2016   Procedure: COLONOSCOPY WITH PROPOFOL ;  Surgeon: Bi Therisa, MD;  Location: [facility] ENDOSCOPY;  Service: Endoscopy;  Laterality: N/A;    COLONOSCOPY WITH PROPOFOL  N/A 02/17/2017   Procedure: COLONOSCOPY WITH PROPOFOL ;  Surgeon: Therisa Bi, MD;  Location: [facility] ENDOSCOPY;  Service: Gastroenterology;  Laterality: N/A;   COLONOSCOPY WITH PROPOFOL  N/A 11/04/2021   Procedure: COLONOSCOPY WITH PROPOFOL ;  Surgeon: Therisa Bi, MD;  Location: [facility] ENDOSCOPY;  Service: Gastroenterology;  Laterality: N/A;   CYSTOSCOPY N/A 05/15/2022   Procedure: CYSTOSCOPY;  Surgeon: Leonce Garnette BIRCH, MD;  Location: [facility] ORS;  Service: Gynecology;  Laterality: N/A;   ESOPHAGOGASTRODUODENOSCOPY N/A 03/02/2024   Procedure: EGD (ESOPHAGOGASTRODUODENOSCOPY);  Surgeon: Therisa Bi, MD;  Location: [facility] ENDOSCOPY;  Service: Gastroenterology;  Laterality: N/A;  DM also on OZEMPIC    ESOPHAGOGASTRODUODENOSCOPY N/A 06/08/2024   Procedure: EGD (ESOPHAGOGASTRODUODENOSCOPY);  Surgeon: Therisa Bi, MD;  Location: [facility] ENDOSCOPY;  Service: Gastroenterology;  Laterality: N/A;  DM-Eliquis    ESOPHAGOGASTRODUODENOSCOPY (EGD) WITH PROPOFOL  N/A 02/17/2017   Procedure: ESOPHAGOGASTRODUODENOSCOPY (EGD) WITH PROPOFOL ;  Surgeon: Therisa Bi, MD;  Location: [facility] ENDOSCOPY;  Service: Gastroenterology;  Laterality: N/A;   ESOPHAGOGASTRODUODENOSCOPY (EGD) WITH PROPOFOL  N/A 03/07/2019   Procedure: ESOPHAGOGASTRODUODENOSCOPY (EGD) WITH PROPOFOL ;  Surgeon: Therisa Bi, MD;  Location: [facility] ENDOSCOPY;  Service: Gastroenterology;  Laterality: N/A;   EXCISION/RELEASE BURSA HIP Right 12/1984   HERNIA REPAIR  1985   IVC FILTER REMOVAL     2020s   JOINT REPLACEMENT     Left hip also a right hip bursectomy with complications   LEFT HEART CATHETERIZATION WITH CORONARY ANGIOGRAM N/A 12/09/2014   Procedure: LEFT HEART CATHETERIZATION WITH CORONARY ANGIOGRAM;  Surgeon: Lonni BIRCH Cash, MD;  Location: [facility] CATH LAB;  Service: Cardiovascular;  Laterality: N/A;   PARTIAL HYSTERECTOMY  1985   ROBOTIC ASSISTED BILATERAL SALPINGO OOPHERECTOMY Bilateral 05/15/2022   Procedure: XI ROBOTIC ASSISTED  BILATERAL OOPHORECTOMY;  Surgeon: Leonce Garnette BIRCH, MD;  Location: [facility] ORS;  Service: Gynecology;  Laterality: Bilateral;   SPINE SURGERY  07/2023   Also 10/2012 in [place] the neck, and i cant remember the other one 2022 or 05/2022?   TONSILLECTOMY AND ADENOIDECTOMY  ~ 1968   TOTAL HIP ARTHROPLASTY Left 04/06/2014   UMBILICAL HERNIA REPAIR  1985   VENA CAVA FILTER PLACEMENT  03/2014    Prior to Admission medications  Medication Sig Start Date End Date Taking? Authorizing Provider  b complex vitamins capsule Take 1 capsule by mouth daily.   Yes [provider]  baclofen  (LIORESAL ) 10 MG tablet Take 1 tablet (10 mg total) by mouth 3 (three) times daily as needed for muscle spasms. 06/16/24  Yes Pardue, Lauraine SAILOR, DO  blood glucose meter kit and supplies KIT Dispense based on patient and insurance preference. Use once daily fasting. Dx code E11.9. 08/27/21  Yes Sonnenberg,  Camellia MATSU, MD  Blood Glucose Monitoring Suppl (GHT BLOOD GLUCOSE MONITOR) w/Device KIT DISPENSE BASED ON PATIENT AND INSURANCE PREFERENCE. USE ONCE DAILY AS DIRECTED. DX CODE E11.9. 01/14/21  Yes Maribeth Camellia MATSU, MD  budesonide -formoterol  (SYMBICORT ) 160-4.5 MCG/ACT inhaler    Yes [provider]  dicyclomine  (BENTYL ) 20 MG tablet TAKE ONE TABLET BY MOUTH THREE TIMES DAILY AS NEEDED FOR ABDOMINAL SPASMS (VIAL) 06/13/24  Yes Pardue, Sarah N, DO  empagliflozin  (JARDIANCE ) 10 MG TABS tablet Take 1 tablet (10 mg total) by mouth daily before breakfast. 08/17/24  Yes Pardue, Lauraine SAILOR, DO  escitalopram  (LEXAPRO ) 20 MG tablet TAKE ONE TABLET BY MOUTH DAILY AT 9AM 10/24/22  Yes Maribeth Camellia MATSU, MD  estradiol  (ESTRACE ) 0.1 MG/GM vaginal cream Place 0.5 g vaginally 2 (two) times a week. Place 0.5g nightly for two weeks then twice a week after 01/25/24  Yes Guadlupe Dull T, MD  famotidine  (PEPCID ) 20 MG tablet TAKE 1 TABLET BY MOUTH EVERYDAY AT BEDTIME 09/08/24  Yes Pardue, Sarah N, DO  ferrous sulfate  325 (65 FE) MG tablet Take 325 mg  by mouth daily with breakfast.   Yes [provider]  fluticasone  (FLONASE ) 50 MCG/ACT nasal spray SPRAY 2 SPRAYS INTO EACH NOSTRIL EVERY DAY 08/11/24  Yes Pardue, Sarah N, DO  furosemide  (LASIX ) 20 MG tablet TAKE ONE TABLET BY MOUTH DAILY AS NEEDED FOR EDEMA (VIAL) 01/12/23  Yes Maribeth Camellia MATSU, MD  gabapentin  (NEURONTIN ) 600 MG tablet TAKE 1 TABLET BY MOUTH DAILY AT 9AM, AND 1 AND 1/2 TABS AT 9PM 06/13/24  Yes Pardue, Sarah N, DO  levothyroxine  (SYNTHROID ) 25 MCG tablet TAKE ONE TABLET BY MOUTH DAILY AT 7AM BEFORE BREAKFAST AND 2-3 HOURS BEFORE TAKING ANY OTHER MEDICATION OR SUPPLEMENT 08/17/24  Yes Pardue, Lauraine SAILOR, DO  Magnesium  Citrate (MAGNESIUM  GUMMIES PO) Take 200 mg by mouth daily.   Yes [provider]  Multiple Vitamins-Minerals (MULTIVITAMIN GUMMIES ADULT) CHEW Chew 2 each by mouth daily.   Yes [provider]  OLANZapine  (ZYPREXA ) 10 MG tablet TAKE 1 TABLET BY MOUTH EVERYDAY AT BEDTIME 11/17/22  Yes Maribeth Camellia MATSU, MD  ondansetron  (ZOFRAN -ODT) 4 MG disintegrating tablet Take 1 tablet (4 mg total) by mouth every 6 (six) hours as needed. for nausea 08/17/24  Yes Pardue, Lauraine SAILOR, DO  pantoprazole  (PROTONIX ) 40 MG tablet TAKE 1 TABLET BY MOUTH EVERY DAY 06/13/24  Yes Pardue, Sarah N, DO  potassium chloride  SA (KLOR-CON  M) 20 MEQ tablet TAKE ONE TABLET BY MOUTH DAILY AS NEEDED TAKE WITH LASIX  (VIAL) Patient taking differently: 20 mEq as needed. TAKE ONE TABLET BY MOUTH DAILY AS NEEDED TAKE WITH LASIX  (VIAL) 12/29/22  Yes Maribeth Camellia MATSU, MD  Probiotic Product (PROBIOTIC BLEND) CAPS Take 1 capsule by mouth daily.   Yes [provider]  rosuvastatin  (CRESTOR ) 40 MG tablet Take 1 tablet (40 mg total) by mouth at bedtime. 06/07/24  Yes Pardue, Lauraine SAILOR, DO  senna (SENOKOT) 8.6 MG tablet Take 1 tablet by mouth daily. 02/23/24  Yes [provider]  sucralfate  (CARAFATE ) 1 g tablet Take 1 tablet (1 g total) by mouth 4 (four) times daily -  with meals  and at bedtime. 08/11/24  Yes Franchot Isaiah LABOR, MD  traMADol  (ULTRAM ) 50 MG tablet Take 1 tablet (50 mg total) by mouth every 12 (twelve) hours as needed. 08/08/24  Yes Simmons-Robinson, Makiera, MD  Vibegron  (GEMTESA ) 75 MG TABS Take 1 tablet (75 mg total) by mouth daily. 09/06/24  Yes Guadlupe, Dull  T, MD  vitamin C (ASCORBIC ACID) 250 MG tablet Take 250 mg by mouth daily.   Yes [provider]  Zinc  Citrate (ZINC  GUMMY PO) Take 2 each by mouth daily.   Yes [provider]  albuterol  (VENTOLIN  HFA) 108 (90 Base) MCG/ACT inhaler TAKE 2 PUFFS BY MOUTH EVERY 6 HOURS AS NEEDED FOR WHEEZE OR SHORTNESS OF BREATH 12/17/23   Marylynn Verneita CROME, MD  ELIQUIS  5 MG TABS tablet TAKE 1 TABLET BY MOUTH TWICE A DAY 06/11/24   Pardue, Lauraine SAILOR, DO  nitroGLYCERIN  (NITROSTAT ) 0.4 MG SL tablet Place 1 tablet (0.4 mg total) under the tongue every 5 (five) minutes as needed for chest pain. 06/26/23   Darliss Rogue, MD  Semaglutide ,0.25 or 0.5MG /DOS, (OZEMPIC , 0.25 OR 0.5 MG/DOSE,) 2 MG/3ML SOPN Inject 0.5 mg into the skin once a week. 08/17/24   [name], DO    Allergies as of 09/14/2024 - Review Complete 09/14/2024  Allergen Reaction Noted   Other Hives, Itching, and Swelling 12/09/2014   Abilify [aripiprazole] Other (See Comments) 09/20/2013   Augmentin [amoxicillin-pot clavulanate] Diarrhea and Nausea And Vomiting 09/20/2013   Bactrim [sulfamethoxazole-trimethoprim] Diarrhea 09/20/2013   Ceftin [cefuroxime axetil] Diarrhea 09/20/2013   Clavulanic acid  09/11/2022   Coconut (cocos nucifera)  10/19/2015   Diclofenac  Other (See Comments) 08/01/2014   Indocin [indomethacin] Other (See Comments) 09/20/2013   Linaclotide Diarrhea 12/09/2014   Lisinopril Diarrhea and Other (See Comments) 09/20/2013   Morphine  Other (See Comments) 12/09/2014   Morphine  and codeine  Other (See Comments) 09/20/2013   Simvastatin Other (See Comments) 08/01/2014   Sulfa antibiotics Diarrhea 12/09/2014   Wellbutrin  [bupropion] Other (See Comments) 09/20/2013   Zetia  [ezetimibe ]  09/18/2017   Seroquel [quetiapine fumarate] Palpitations 08/16/2014    Family History  Problem Relation Age of Onset   Stroke Mother        Deceased   Lung cancer Father        Deceased   Alcohol abuse Father    Breast cancer Sister 43   Healthy Daughter    Cancer Brother    Healthy Son        x 2   Other Son        Suicide   Birth defects Son    Early death Son    Cancer Brother    Intellectual disability Son    Heart attack Neg Hx     Social History   Socioeconomic History   Marital status: Divorced    Spouse name: Not on file   Number of children: Not on file   Years of education: Not on file   Highest education level: Some college, no degree  Occupational History   Not on file  Tobacco Use   Smoking status: Former    Current packs/day: 0.00    Average packs/day: 1.5 packs/day for 10.0 years (15.0 ttl pk-yrs)    Types: Cigarettes    Start date: 04/11/1969    Quit date: 04/12/1979    Years since quitting: 45.4   Smokeless tobacco: Never  Vaping Use   Vaping status: Never Used  Substance and Sexual Activity   Alcohol use: No    Alcohol/week: 0.0 standard drinks of alcohol   Drug use: No   Sexual activity: Not Currently  Other Topics Concern   Not on file  Social History Narrative   Lives with fiancee in an apartment on the first floor.  Has 3 grown children.  One child passed away.  On  disability 1996 - 2000, 2007 - current.     Education: some college.   Caffeine rarely   retired   [employer] D[employer] of [employer]   Tobacco Use: Medium Risk (09/15/2024)   Patient History    Smoking Tobacco Use: Former    Smokeless Tobacco Use: Never    Passive Exposure: Not on file  Financial Resource Strain: Medium Risk (09/13/2024)   Received from [facility]   Overall Financial Resource Strain (CARDIA)    Difficulty of Paying Living Expenses: Somewhat hard  Food Insecurity: No Food  Insecurity (09/13/2024)   Received from [facility]   Epic    Within the past 12 months, you worried that your food would run out before you got the money to buy more.: Never true    Within the past 12 months, the food you bought just didn't last and you didn't have money to get more.: Never true  Transportation Needs: No Transportation Needs (09/13/2024)   Received from [facility] - Transportation    In the past 12 months, has lack of transportation kept you from medical appointments or from getting medications?: No    Lack of Transportation (Non-Medical): No  Physical Activity: Inactive (08/16/2024)   Exercise Vital Sign    Days of Exercise per Week: 0 days    Minutes of Exercise per Session: Not on file  Stress: Stress Concern Present (08/16/2024)   [employer] - Occupational Stress Questionnaire    Feeling of Stress: To some extent  Social Connections: Moderately Integrated (08/16/2024)   Social Connection and Isolation Panel    Frequency of Communication with Friends and Family: More than three times a week    Frequency of Social Gatherings with Friends and Family: Three times a week    Attends Religious Services: More than 4 times per year    Active Member of Clubs or Organizations: Yes    Attends Banker Meetings: More than 4 times per year    Marital Status: Divorced  Intimate Partner Violence: Not At Risk (02/10/2024)   Humiliation, Afraid, Rape, and Kick questionnaire    Fear of Current or Ex-Partner: No    Emotionally Abused: No    Physically Abused: No    Sexually Abused: No  Depression (PHQ2-9): Low Risk (08/17/2024)   Depression (PHQ2-9)    PHQ-2 Score: 4  Alcohol Screen: Low Risk (02/10/2024)   Alcohol Screen    Last Alcohol Screening Score (AUDIT): 0  Housing: Low Risk  (09/13/2024)   Received from [facility]   Epic    In the last 12 months, was there a time  when you were not able to pay the mortgage or rent on time?: No    In the past 12 months, how many times have you moved where you were living?: 0    At any time in the past 12 months, were you homeless or living in a shelter (including now)?: No  Utilities: Not At Risk (09/13/2024)   Received from [facility]   Epic    In the past 12 months has the electric, gas, oil, or water company threatened to shut off services in your home?: No  Health Literacy: Adequate Health Literacy (03/28/2024)   B1300 Health Literacy    Frequency of need for help with medical instructions: Never    Review of Systems: See HPI, otherwise negative ROS  Physical Exam: BP ROLLEN)  162/93   Pulse 76   Temp (!) 96.8 F (36 C) (Temporal)   Resp 18   Ht 5' 6 (1.676 m)   Wt 80.6 kg   SpO2 99%   BMI 28.70 kg/m  General:   Alert,  pleasant and cooperative in NAD Head:  Normocephalic and atraumatic. Neck:  Supple; no masses or thyromegaly. Lungs:  Clear throughout to auscultation, normal respiratory effort.    Heart:  +S1, +S2, Regular rate and rhythm, No edema. Abdomen:  Soft, nontender and nondistended. Normal bowel sounds, without guarding, and without rebound.   Neurologic:  Alert and  oriented x4;  grossly normal neurologically.  Impression/Plan: Valerie Vazquez is here for an colonoscopy to be performed for surveillance due to prior history of colon polyps   Risks, benefits, limitations, and alternatives regarding  colonoscopy have been reviewed with the patient.  Questions have been answered.  All parties agreeable.   [name], MD  09/15/2024, 10:54 AM  "

## 2024-09-15 NOTE — Transfer of Care (Signed)
 Immediate Anesthesia Transfer of Care Note  Patient: Valerie Vazquez  Procedure(s) Performed: COLONOSCOPY POLYPECTOMY, INTESTINE  Patient Location: Endoscopy Unit  Anesthesia Type:General  Level of Consciousness: drowsy  Airway & Oxygen  Therapy: Patient Spontanous Breathing and Patient connected to nasal cannula oxygen   Post-op Assessment: Report given to RN and Post -op Vital signs reviewed and stable  Post vital signs: Reviewed and stable  Last Vitals:  Vitals Value Taken Time  BP 127/71 09/15/24 11:21  Temp 35.8 C 09/15/24 11:21  Pulse 73 09/15/24 11:21  Resp 24 09/15/24 11:21  SpO2 99 % 09/15/24 11:21    Last Pain:  Vitals:   09/15/24 1121  TempSrc: Temporal  PainSc: Asleep         Complications: No notable events documented.

## 2024-09-15 NOTE — Anesthesia Postprocedure Evaluation (Signed)
"   Anesthesia Post Note  Patient: ENEDELIA MARTORELLI  Procedure(s) Performed: COLONOSCOPY POLYPECTOMY, INTESTINE  Patient location during evaluation: Endoscopy Anesthesia Type: General Level of consciousness: awake and alert Pain management: pain level controlled Vital Signs Assessment: post-procedure vital signs reviewed and stable Respiratory status: spontaneous breathing, nonlabored ventilation, respiratory function stable and patient connected to nasal cannula oxygen  Cardiovascular status: blood pressure returned to baseline and stable Postop Assessment: no apparent nausea or vomiting Anesthetic complications: no   No notable events documented.   Last Vitals:  Vitals:   09/15/24 1026 09/15/24 1121  BP: (!) 162/93 127/71  Pulse: 76 73  Resp: 18 (!) 24  Temp: (!) 36 C (!) 35.8 C  SpO2: 99% 99%    Last Pain:  Vitals:   09/15/24 1121  TempSrc: Temporal  PainSc: Asleep                 Fairy A Jonavon Trieu      "

## 2024-09-16 LAB — SURGICAL PATHOLOGY

## 2024-09-23 ENCOUNTER — Ambulatory Visit: Payer: Self-pay | Admitting: Gastroenterology

## 2024-09-30 ENCOUNTER — Ambulatory Visit
Admission: RE | Admit: 2024-09-30 | Discharge: 2024-09-30 | Disposition: A | Source: Ambulatory Visit | Attending: Family Medicine | Admitting: Family Medicine

## 2024-09-30 ENCOUNTER — Other Ambulatory Visit: Payer: Self-pay | Admitting: Family Medicine

## 2024-09-30 DIAGNOSIS — N6342 Unspecified lump in left breast, subareolar: Secondary | ICD-10-CM | POA: Insufficient documentation

## 2024-09-30 DIAGNOSIS — N6321 Unspecified lump in the left breast, upper outer quadrant: Secondary | ICD-10-CM | POA: Diagnosis not present

## 2024-09-30 DIAGNOSIS — N6310 Unspecified lump in the right breast, unspecified quadrant: Secondary | ICD-10-CM

## 2024-09-30 DIAGNOSIS — Z1231 Encounter for screening mammogram for malignant neoplasm of breast: Secondary | ICD-10-CM | POA: Diagnosis present

## 2024-09-30 DIAGNOSIS — N6320 Unspecified lump in the left breast, unspecified quadrant: Secondary | ICD-10-CM

## 2024-09-30 DIAGNOSIS — N6312 Unspecified lump in the right breast, upper inner quadrant: Secondary | ICD-10-CM | POA: Diagnosis not present

## 2024-10-03 ENCOUNTER — Other Ambulatory Visit: Payer: Self-pay | Admitting: Family Medicine

## 2024-10-03 DIAGNOSIS — R928 Other abnormal and inconclusive findings on diagnostic imaging of breast: Secondary | ICD-10-CM

## 2024-10-04 ENCOUNTER — Inpatient Hospital Stay: Admission: RE | Admit: 2024-10-04 | Discharge: 2024-10-04 | Attending: Family Medicine | Admitting: Family Medicine

## 2024-10-04 DIAGNOSIS — R928 Other abnormal and inconclusive findings on diagnostic imaging of breast: Secondary | ICD-10-CM

## 2024-10-04 MED ORDER — LIDOCAINE-EPINEPHRINE 1 %-1:100000 IJ SOLN
10.0000 mL | Freq: Once | INTRAMUSCULAR | Status: AC
  Administered 2024-10-04: 10 mL via INTRADERMAL
  Filled 2024-10-04: qty 10

## 2024-10-04 MED ORDER — LIDOCAINE 1 % OPTIME INJ - NO CHARGE
2.0000 mL | Freq: Once | INTRAMUSCULAR | Status: AC
  Administered 2024-10-04: 2 mL via INTRADERMAL
  Filled 2024-10-04: qty 2

## 2024-10-05 LAB — SURGICAL PATHOLOGY

## 2024-10-06 ENCOUNTER — Encounter: Payer: Self-pay | Admitting: *Deleted

## 2024-10-06 DIAGNOSIS — C50911 Malignant neoplasm of unspecified site of right female breast: Secondary | ICD-10-CM

## 2024-10-06 NOTE — Progress Notes (Signed)
 Received referral for newly diagnosed breast cancer from [facility].  Navigation initiated.  Referral sent to [facility], their office will call her with the appointment.   She will see Dr. Babara on Tuesday 2/10 at 9:30.

## 2024-10-07 ENCOUNTER — Other Ambulatory Visit: Payer: Self-pay | Admitting: Family Medicine

## 2024-10-07 ENCOUNTER — Telehealth: Payer: Self-pay

## 2024-10-07 ENCOUNTER — Other Ambulatory Visit: Payer: Self-pay | Admitting: Internal Medicine

## 2024-10-07 DIAGNOSIS — J101 Influenza due to other identified influenza virus with other respiratory manifestations: Secondary | ICD-10-CM

## 2024-10-07 DIAGNOSIS — G8929 Other chronic pain: Secondary | ICD-10-CM

## 2024-10-07 NOTE — Telephone Encounter (Signed)
 Please review and advise.

## 2024-10-07 NOTE — Telephone Encounter (Signed)
 Copied from CRM #[id number]. Topic: Clinical - Lab/Test Results >> Oct 07, 2024 10:03 AM Emylou G wrote: Reason for CRM: Patient called back.. would like more details on her pathology report.

## 2024-10-11 ENCOUNTER — Inpatient Hospital Stay

## 2024-10-11 ENCOUNTER — Inpatient Hospital Stay: Admitting: Oncology

## 2024-10-19 ENCOUNTER — Ambulatory Visit: Payer: Self-pay | Admitting: Surgery

## 2024-11-02 ENCOUNTER — Inpatient Hospital Stay

## 2024-11-14 ENCOUNTER — Ambulatory Visit: Payer: Self-pay | Admitting: Cardiology

## 2024-11-15 ENCOUNTER — Encounter

## 2024-12-29 ENCOUNTER — Ambulatory Visit: Admitting: Pulmonary Disease

## 2025-02-21 ENCOUNTER — Ambulatory Visit

## 2025-02-28 ENCOUNTER — Ambulatory Visit
